# Patient Record
Sex: Female | Born: 1935
Health system: Southern US, Community
[De-identification: ages and names within clinical notes are randomized; demographics above are authoritative.]

## PROBLEM LIST (undated history)

## (undated) DIAGNOSIS — E119 Type 2 diabetes mellitus without complications: Secondary | ICD-10-CM

## (undated) DIAGNOSIS — E559 Vitamin D deficiency, unspecified: Secondary | ICD-10-CM

## (undated) DIAGNOSIS — I639 Cerebral infarction, unspecified: Secondary | ICD-10-CM

## (undated) DIAGNOSIS — M109 Gout, unspecified: Secondary | ICD-10-CM

## (undated) DIAGNOSIS — M1990 Unspecified osteoarthritis, unspecified site: Secondary | ICD-10-CM

## (undated) DIAGNOSIS — M7040 Prepatellar bursitis, unspecified knee: Secondary | ICD-10-CM

## (undated) DIAGNOSIS — R229 Localized swelling, mass and lump, unspecified: Secondary | ICD-10-CM

## (undated) DIAGNOSIS — M6281 Muscle weakness (generalized): Secondary | ICD-10-CM

## (undated) DIAGNOSIS — R059 Cough, unspecified: Secondary | ICD-10-CM

## (undated) DIAGNOSIS — R296 Repeated falls: Secondary | ICD-10-CM

## (undated) DIAGNOSIS — J189 Pneumonia, unspecified organism: Secondary | ICD-10-CM

## (undated) DIAGNOSIS — M62838 Other muscle spasm: Secondary | ICD-10-CM

## (undated) DIAGNOSIS — R5381 Other malaise: Secondary | ICD-10-CM

## (undated) DIAGNOSIS — I509 Heart failure, unspecified: Secondary | ICD-10-CM

## (undated) DIAGNOSIS — L03039 Cellulitis of unspecified toe: Secondary | ICD-10-CM

## (undated) DIAGNOSIS — I2699 Other pulmonary embolism without acute cor pulmonale: Secondary | ICD-10-CM

## (undated) DIAGNOSIS — R569 Unspecified convulsions: Secondary | ICD-10-CM

## (undated) DIAGNOSIS — M545 Low back pain: Secondary | ICD-10-CM

## (undated) DIAGNOSIS — R05 Cough: Secondary | ICD-10-CM

## (undated) DIAGNOSIS — I2510 Atherosclerotic heart disease of native coronary artery without angina pectoris: Secondary | ICD-10-CM

## (undated) DIAGNOSIS — R5383 Other fatigue: Secondary | ICD-10-CM

## (undated) DIAGNOSIS — I10 Essential (primary) hypertension: Secondary | ICD-10-CM

## (undated) DIAGNOSIS — I440 Atrioventricular block, first degree: Secondary | ICD-10-CM

## (undated) DIAGNOSIS — R609 Edema, unspecified: Secondary | ICD-10-CM

## (undated) DIAGNOSIS — E785 Hyperlipidemia, unspecified: Secondary | ICD-10-CM

## (undated) DIAGNOSIS — R0602 Shortness of breath: Secondary | ICD-10-CM

## (undated) DIAGNOSIS — J301 Allergic rhinitis due to pollen: Secondary | ICD-10-CM

## (undated) HISTORY — PX: CATARACT EXTRACTION W/ INTRAOCULAR LENS  IMPLANT, BILATERAL: SHX1307

## (undated) HISTORY — DX: Atrioventricular block, first degree: I44.0

## (undated) HISTORY — DX: Cough, unspecified: R05.9

## (undated) HISTORY — DX: Other muscle spasm: M62.838

## (undated) HISTORY — DX: Cellulitis of unspecified toe: L03.039

## (undated) HISTORY — DX: Other fatigue: R53.83

## (undated) HISTORY — DX: Prepatellar bursitis, unspecified knee: M70.40

## (undated) HISTORY — DX: Vitamin D deficiency, unspecified: E55.9

## (undated) HISTORY — DX: Repeated falls: R29.6

## (undated) HISTORY — DX: Muscle weakness (generalized): M62.81

## (undated) HISTORY — DX: Atherosclerotic heart disease of native coronary artery without angina pectoris: I25.10

## (undated) HISTORY — PX: JOINT REPLACEMENT: SHX530

## (undated) HISTORY — DX: Allergic rhinitis due to pollen: J30.1

## (undated) HISTORY — DX: Unspecified osteoarthritis, unspecified site: M19.90

## (undated) HISTORY — DX: Edema, unspecified: R60.9

## (undated) HISTORY — DX: Other fatigue: R53.81

## (undated) HISTORY — DX: Low back pain: M54.5

## (undated) HISTORY — DX: Cough: R05

## (undated) HISTORY — DX: Localized swelling, mass and lump, unspecified: R22.9

## (undated) HISTORY — DX: Cerebral infarction, unspecified: I63.9

## (undated) HISTORY — DX: Gout, unspecified: M10.9

## (undated) HISTORY — DX: Hyperlipidemia, unspecified: E78.5

## (undated) HISTORY — DX: Other pulmonary embolism without acute cor pulmonale: I26.99

## (undated) HISTORY — DX: Unspecified convulsions: R56.9

---

## 1958-11-09 HISTORY — PX: APPENDECTOMY: SHX54

## 1974-08-09 HISTORY — PX: TONSILLECTOMY: SUR1361

## 1980-04-09 HISTORY — PX: ABDOMINAL HYSTERECTOMY: SHX81

## 1998-09-10 ENCOUNTER — Encounter: Payer: Self-pay | Admitting: Family Medicine

## 1998-09-10 ENCOUNTER — Ambulatory Visit (HOSPITAL_COMMUNITY): Admission: RE | Admit: 1998-09-10 | Discharge: 1998-09-10 | Payer: Self-pay | Admitting: Family Medicine

## 1999-06-11 ENCOUNTER — Ambulatory Visit (HOSPITAL_COMMUNITY): Admission: RE | Admit: 1999-06-11 | Discharge: 1999-06-11 | Payer: Self-pay | Admitting: *Deleted

## 1999-07-04 ENCOUNTER — Encounter: Payer: Self-pay | Admitting: Orthopaedic Surgery

## 1999-07-04 ENCOUNTER — Ambulatory Visit (HOSPITAL_COMMUNITY): Admission: RE | Admit: 1999-07-04 | Discharge: 1999-07-04 | Payer: Self-pay | Admitting: Orthopaedic Surgery

## 1999-07-11 HISTORY — PX: SHOULDER OPEN ROTATOR CUFF REPAIR: SHX2407

## 1999-08-07 ENCOUNTER — Ambulatory Visit (HOSPITAL_BASED_OUTPATIENT_CLINIC_OR_DEPARTMENT_OTHER): Admission: RE | Admit: 1999-08-07 | Discharge: 1999-08-07 | Payer: Self-pay | Admitting: Orthopaedic Surgery

## 1999-08-26 ENCOUNTER — Encounter: Admission: RE | Admit: 1999-08-26 | Discharge: 1999-11-24 | Payer: Self-pay | Admitting: Orthopaedic Surgery

## 2000-04-01 ENCOUNTER — Ambulatory Visit (HOSPITAL_COMMUNITY): Admission: RE | Admit: 2000-04-01 | Discharge: 2000-04-01 | Payer: Self-pay | Admitting: *Deleted

## 2001-03-15 ENCOUNTER — Encounter: Payer: Self-pay | Admitting: Emergency Medicine

## 2001-03-15 ENCOUNTER — Inpatient Hospital Stay (HOSPITAL_COMMUNITY): Admission: EM | Admit: 2001-03-15 | Discharge: 2001-03-18 | Payer: Self-pay | Admitting: Emergency Medicine

## 2001-03-16 ENCOUNTER — Encounter: Payer: Self-pay | Admitting: Internal Medicine

## 2001-10-11 ENCOUNTER — Emergency Department (HOSPITAL_COMMUNITY): Admission: EM | Admit: 2001-10-11 | Discharge: 2001-10-12 | Payer: Self-pay

## 2001-10-12 ENCOUNTER — Encounter: Payer: Self-pay | Admitting: Emergency Medicine

## 2001-11-09 HISTORY — PX: TRANSTHORACIC ECHOCARDIOGRAM: SHX275

## 2001-11-09 HISTORY — PX: CARDIAC CATHETERIZATION: SHX172

## 2001-11-16 ENCOUNTER — Encounter: Payer: Self-pay | Admitting: Internal Medicine

## 2001-11-16 ENCOUNTER — Ambulatory Visit (HOSPITAL_COMMUNITY): Admission: RE | Admit: 2001-11-16 | Discharge: 2001-11-16 | Payer: Self-pay | Admitting: Internal Medicine

## 2001-11-22 ENCOUNTER — Ambulatory Visit (HOSPITAL_COMMUNITY): Admission: RE | Admit: 2001-11-22 | Discharge: 2001-11-22 | Payer: Self-pay | Admitting: Internal Medicine

## 2001-12-30 ENCOUNTER — Inpatient Hospital Stay (HOSPITAL_COMMUNITY): Admission: EM | Admit: 2001-12-30 | Discharge: 2002-01-03 | Payer: Self-pay | Admitting: Emergency Medicine

## 2001-12-30 ENCOUNTER — Encounter: Payer: Self-pay | Admitting: Pediatrics

## 2001-12-31 ENCOUNTER — Encounter: Payer: Self-pay | Admitting: Pediatrics

## 2002-01-05 ENCOUNTER — Encounter: Payer: Self-pay | Admitting: Pediatrics

## 2002-01-05 ENCOUNTER — Encounter (INDEPENDENT_AMBULATORY_CARE_PROVIDER_SITE_OTHER): Payer: Self-pay | Admitting: *Deleted

## 2002-01-05 ENCOUNTER — Inpatient Hospital Stay (HOSPITAL_COMMUNITY): Admission: EM | Admit: 2002-01-05 | Discharge: 2002-01-09 | Payer: Self-pay | Admitting: Emergency Medicine

## 2002-01-06 ENCOUNTER — Encounter: Payer: Self-pay | Admitting: Internal Medicine

## 2003-04-17 ENCOUNTER — Encounter: Payer: Self-pay | Admitting: Internal Medicine

## 2003-04-17 ENCOUNTER — Ambulatory Visit (HOSPITAL_COMMUNITY): Admission: RE | Admit: 2003-04-17 | Discharge: 2003-04-17 | Payer: Self-pay | Admitting: Internal Medicine

## 2003-04-28 ENCOUNTER — Encounter: Payer: Self-pay | Admitting: Internal Medicine

## 2003-04-28 ENCOUNTER — Ambulatory Visit (HOSPITAL_COMMUNITY): Admission: RE | Admit: 2003-04-28 | Discharge: 2003-04-28 | Payer: Self-pay | Admitting: Internal Medicine

## 2003-09-17 ENCOUNTER — Inpatient Hospital Stay (HOSPITAL_COMMUNITY): Admission: RE | Admit: 2003-09-17 | Discharge: 2003-09-20 | Payer: Self-pay | Admitting: Orthopedic Surgery

## 2003-09-20 ENCOUNTER — Inpatient Hospital Stay (HOSPITAL_COMMUNITY): Admission: RE | Admit: 2003-09-20 | Discharge: 2003-09-27 | Payer: Self-pay | Admitting: Orthopedic Surgery

## 2003-10-29 ENCOUNTER — Encounter: Admission: RE | Admit: 2003-10-29 | Discharge: 2003-12-24 | Payer: Self-pay | Admitting: Orthopedic Surgery

## 2004-08-21 ENCOUNTER — Ambulatory Visit: Payer: Self-pay | Admitting: Family Medicine

## 2004-08-23 ENCOUNTER — Emergency Department (HOSPITAL_COMMUNITY): Admission: EM | Admit: 2004-08-23 | Discharge: 2004-08-23 | Payer: Self-pay | Admitting: Emergency Medicine

## 2004-08-27 ENCOUNTER — Encounter: Admission: RE | Admit: 2004-08-27 | Discharge: 2004-08-27 | Payer: Self-pay | Admitting: Orthopaedic Surgery

## 2004-09-09 ENCOUNTER — Ambulatory Visit: Payer: Self-pay | Admitting: Internal Medicine

## 2004-09-16 ENCOUNTER — Encounter: Admission: RE | Admit: 2004-09-16 | Discharge: 2004-10-28 | Payer: Self-pay | Admitting: Orthopaedic Surgery

## 2004-10-14 ENCOUNTER — Ambulatory Visit: Payer: Self-pay | Admitting: Family Medicine

## 2005-01-01 ENCOUNTER — Ambulatory Visit: Payer: Self-pay | Admitting: Family Medicine

## 2005-02-13 ENCOUNTER — Ambulatory Visit: Payer: Self-pay | Admitting: Family Medicine

## 2005-04-10 ENCOUNTER — Emergency Department (HOSPITAL_COMMUNITY): Admission: EM | Admit: 2005-04-10 | Discharge: 2005-04-10 | Payer: Self-pay | Admitting: Emergency Medicine

## 2005-04-13 ENCOUNTER — Ambulatory Visit: Payer: Self-pay | Admitting: Family Medicine

## 2005-04-15 ENCOUNTER — Ambulatory Visit (HOSPITAL_COMMUNITY): Admission: RE | Admit: 2005-04-15 | Discharge: 2005-04-15 | Payer: Self-pay | Admitting: Internal Medicine

## 2005-04-28 ENCOUNTER — Ambulatory Visit: Payer: Self-pay | Admitting: Family Medicine

## 2005-09-09 HISTORY — PX: REPLACEMENT TOTAL KNEE: SUR1224

## 2005-09-17 ENCOUNTER — Ambulatory Visit: Payer: Self-pay | Admitting: Family Medicine

## 2005-10-08 ENCOUNTER — Ambulatory Visit: Payer: Self-pay | Admitting: Family Medicine

## 2005-11-11 ENCOUNTER — Ambulatory Visit: Payer: Self-pay | Admitting: Family Medicine

## 2006-03-02 ENCOUNTER — Ambulatory Visit: Payer: Self-pay | Admitting: Family Medicine

## 2006-04-13 ENCOUNTER — Ambulatory Visit: Payer: Self-pay | Admitting: Family Medicine

## 2006-06-15 ENCOUNTER — Ambulatory Visit: Payer: Self-pay | Admitting: Family Medicine

## 2006-07-20 ENCOUNTER — Ambulatory Visit: Payer: Self-pay | Admitting: Family Medicine

## 2006-08-17 ENCOUNTER — Ambulatory Visit: Payer: Self-pay | Admitting: Family Medicine

## 2006-10-19 ENCOUNTER — Ambulatory Visit: Payer: Self-pay | Admitting: Family Medicine

## 2006-11-14 ENCOUNTER — Emergency Department (HOSPITAL_COMMUNITY): Admission: EM | Admit: 2006-11-14 | Discharge: 2006-11-15 | Payer: Self-pay | Admitting: Emergency Medicine

## 2006-11-15 ENCOUNTER — Ambulatory Visit: Payer: Self-pay | Admitting: Family Medicine

## 2006-11-17 ENCOUNTER — Emergency Department (HOSPITAL_COMMUNITY): Admission: EM | Admit: 2006-11-17 | Discharge: 2006-11-17 | Payer: Self-pay | Admitting: Emergency Medicine

## 2006-11-26 ENCOUNTER — Ambulatory Visit: Payer: Self-pay | Admitting: Family Medicine

## 2007-04-27 DIAGNOSIS — M1990 Unspecified osteoarthritis, unspecified site: Secondary | ICD-10-CM

## 2007-04-27 DIAGNOSIS — E785 Hyperlipidemia, unspecified: Secondary | ICD-10-CM

## 2007-04-27 DIAGNOSIS — J301 Allergic rhinitis due to pollen: Secondary | ICD-10-CM

## 2007-04-27 DIAGNOSIS — E1169 Type 2 diabetes mellitus with other specified complication: Secondary | ICD-10-CM | POA: Insufficient documentation

## 2007-04-27 DIAGNOSIS — K5790 Diverticulosis of intestine, part unspecified, without perforation or abscess without bleeding: Secondary | ICD-10-CM | POA: Insufficient documentation

## 2007-04-27 DIAGNOSIS — E119 Type 2 diabetes mellitus without complications: Secondary | ICD-10-CM | POA: Insufficient documentation

## 2007-04-27 DIAGNOSIS — J309 Allergic rhinitis, unspecified: Secondary | ICD-10-CM | POA: Insufficient documentation

## 2007-04-27 HISTORY — DX: Hyperlipidemia, unspecified: E78.5

## 2007-04-27 HISTORY — DX: Allergic rhinitis due to pollen: J30.1

## 2007-04-27 HISTORY — DX: Unspecified osteoarthritis, unspecified site: M19.90

## 2007-09-14 ENCOUNTER — Encounter: Admission: RE | Admit: 2007-09-14 | Discharge: 2007-10-25 | Payer: Self-pay | Admitting: Orthopaedic Surgery

## 2008-02-14 ENCOUNTER — Encounter: Admission: RE | Admit: 2008-02-14 | Discharge: 2008-02-27 | Payer: Self-pay | Admitting: Orthopaedic Surgery

## 2008-08-24 ENCOUNTER — Encounter: Admission: RE | Admit: 2008-08-24 | Discharge: 2008-08-24 | Payer: Self-pay | Admitting: Internal Medicine

## 2009-08-28 ENCOUNTER — Encounter: Admission: RE | Admit: 2009-08-28 | Discharge: 2009-08-28 | Payer: Self-pay | Admitting: Internal Medicine

## 2010-01-23 ENCOUNTER — Encounter: Admission: RE | Admit: 2010-01-23 | Discharge: 2010-01-23 | Payer: Self-pay | Admitting: Internal Medicine

## 2010-09-01 ENCOUNTER — Encounter: Admission: RE | Admit: 2010-09-01 | Discharge: 2010-09-01 | Payer: Self-pay | Admitting: Internal Medicine

## 2011-03-27 NOTE — Op Note (Signed)
NAME:  [name] [name], [name] [name]                        ACCOUNT NO.:  [id number]   MEDICAL RECORD NO.:  [id number]                   PATIENT TYPE:  INP   LOCATION:  2550                                 FACILITY:  [facility]   PHYSICIAN:  Mila Homer. Sherlean Foot, [name]              DATE OF BIRTH:  [birth year]   DATE OF PROCEDURE:  09/17/2003  DATE OF DISCHARGE:                                 OPERATIVE REPORT   PREOPERATIVE DIAGNOSIS:  Right knee osteoarthritis.   POSTOPERATIVE DIAGNOSIS:  Right knee osteoarthritis.   PROCEDURE:  Right total knee arthroplasty.   SURGEON:  Mila Homer. Sherlean Foot, [name]   ASSISTANT:  Jamelle Rushing, P.A.   ANESTHESIA:  General, plus preoperative femoral nerve block.   INDICATIONS FOR PROCEDURE:  The patient is a 77 year old status post failure  of conservative measures for osteoarthritis of the knee.  Informed consent  was obtained.   DESCRIPTION OF PROCEDURE:  The patient was laid supine and administered  general anesthesia.  A Foley catheter was placed.  The right lower extremity  was prepped and draped in the usual sterile fashion.  A #10 blade was used  to make a 10 cm anterior midline incision.  A new #10 blade was used to make  a straight median parapatellar arthrotomy, and perform synovectomy.  I then  inverted the patella, measured it 23 mm and used a 32 mm reamer to ream down  to 15 mm.  I then used the 32 template to drill three lug holes, and with  the prosthetic trial in place it also measured 23 mm.  I left the kneecap  inverted and removed the prosthetic trial, and used the #10 blade to develop  a periosteal sleeve of the deep MCL off the medial crest of the tibia, and  hold it in place with the retractor.  I then cut the ACL and PCL and  delivered the tibia anteriorly.  I used the intermedullary alignment system  to make a cut perpendicular to the anatomic axis, removing 2 mm of bone off  the lateral tibial plateau.  I removed the excess bone.  I removed  the  intermedullary alignment guide.  Then turned attention to the femur.   I made an intermedullary drill hole in the femur, and then used the  intermedullary guide set on 4-degree valgus cut.  I tamped it down to the  distal aspect of the femur and pinned our distal femoral cutting into place.  A made a distal femoral cut with the sagittal saw, and then I removed the  cutting guides.  I drew the epicondylar axis, posterior condylar angle  measured 3 degrees, and so I sized to a  size D, and pinned through the 3-degree external rotation holes.  I then  placed our femoral cutter in place and made our anterior, posterior and  chamfer cuts.  I removed the skeletonized excess bone, and finished with  a  size D finishing block of the femur.   I then turned attention to the tibia, where I used a size 4 tibial tray.  I  used the drill keel and then put the trial for tibia in place.  I then  placed a trial D femur in place and a 10 mm poly trial.  I then snapped on  the 32 mm patella as well, dropping the angles to 130 degrees.  Patellar  tracking was excellent.  Stability was excellent.   I then irrigated copiously and removed all trial components.  I then  cemented the size 4 tibia first, sized the femur second, snapped in the real  polyethylene, located the knee, put in the extension and cemented in the 32  mm patella.  I then irrigated once again, after the cement was hardened.  Let the tourniquet down (this was at 43 min).  I then closed the arthrotomy  with #1 Vicryls, with a Hemovac coming out superolaterally deep to the  arthrotomy.  I then placed a pain catheter superficial to the arthrotomy  closure, coming out superolaterally as well.  I then closed the deep soft  tissues with interrupted 0 Vicryl with running subcuticular Vicryl, and skin  staples.  I dressed with Xeroform dressing sponges, sterile Webril and TEDS  stocking.   COMPLICATIONS:  None.   DRAINS:  One Hemovac.    TOURNIQUET TIME:  43 min.                                               Mila Homer. Sherlean Foot, [name]    SDL/MEDQ  D:  09/17/2003  T:  09/17/2003  Job:  [id number]

## 2011-03-27 NOTE — H&P (Signed)
[place]  Patient:    [name] [name], HATLESTAD Visit Number: [id number] MRN: [id number]          Service Type: MED Location: 1800 [id number] [id number] Attending Physician:  Devoria Albe Dictated by:   [name], [name] Admit Date:  01/05/2002   CC:         [facility]   History and Physical  DATE OF BIRTH:  [birth year]  HISTORY OF THE PRESENT CONDITION:  [name] is a 77 year old right-handed African-American woman with a history of uncontrolled hypertension who was admitted to [facility] February 21 through February 25 for episodes of dizziness.  Patient was placed on our stroke service.  Her workup including MRI scan of the brain, MRA intracranial, MRA of the neck failed to show significant evidence of acute stroke or of occlusion of vessels.  Patient had mild to moderate small-vessel changes affecting the pons of the deep white matter. She did not have significant atrophy.  The patient had widely patent carotid and vertebral vessels with the left vertebral greater than the right.  There was no flow-limiting stenosis.  Internal carotid artery showed a signal loss on the 2-D imaging but was widely patent on the 3-D imaging.  Right common carotid was widely patent without significant stenosis.  There was some signal loss again on the 2-D study but widely patent on the 3-D study.  Intracranial angiography showed no evidence of proximal stenosis.  There was some irregularity consistent with diffuse intracranial atherosclerotic disease in the anterior circulation and also in the posterior circulation.  A 2-D echocardiogram failed to show any abnormalities of the heart.  Chest x-ray showed mild cardiomegaly without acute changes.  EKG showed sinus tachycardia with possible inferior infarct pattern of undetermined age but no evidence of pathologic arrhythmias.  EEG showed intermittent right frontal slowing with occasional FIRDA.   I have reviewed this latter study and believed that the patient basically had a normal record with the patient awake and drowsy.  When she was alerted, the record became a 10 hertz rhythm; when she was drowsy, frontal rhythmic slowing was evident.  The patient did not drift into natural sleep.  The patient also had significant evaluation including lipid profile that was normal with the exception of elevated cholesterol at 203, HDL 79, LDL 98, triglycerides 129.  Serum homocysteine normal at 9.11.  Hemoglobin A1c normal at 6.3.  Urinalysis showed 3-6 white blood cells, many bacteria, but negative nitrite and negative leukocyte esterase suggesting that this was a contaminated specimen and not a urinary tract infection.  The patient did not show left shift on CBC, did not show significant anemia or hypochromia, or microcytosis.  The patient seemed to do well in the hospital.  During daily evaluations by the stroke physician, Dr. [name], the patient was pleasant, awake, and alert. At other times, however, according to family members, the patient seemed to be having hallucinations - both visual and auditory.  The patient was discharged home and we were unaware of her behavior.  I was called yesterday with concerns of the patient had been up all night, crying out, seeing people in her home, people dive under her bed, visualizing smoke and other humors coming from her ceiling.  These people did not speak to her.  At other times, however, she thought that she heard voices as if they were coming out of a crowd.  There were no command voices.  The patient has  not had pressured speech, has not had paranoid ideation, but she has seemed quite frightened and has frightened her family - who have stayed with her continuously since her discharge.  In addition, the patient has had some periods of incoherent speech, saying words backwards and speaking in language that was not understood by her family.  At  other times, her speech seemed very slurred.  PAST MEDICAL HISTORY:  1. Periods of dizziness which may involve vertigo disequilibrium but this is     of unknown cause, given the previous workup.  2. Hypertension.  3. Osteoarthritis.  4. Moderate obesity.  5. Gout.  6. Post menopausal changes.  7. Prior angina with negative workup.  PAST SURGICAL HISTORY:  Hysterectomy, appendectomy, tonsillectomy, rotator cuff repair.  CURRENT MEDICATIONS:  1. Lopressor 100 mg b.i.d.  2. Colchicine 0.6 mg b.i.d.  3. Imdur 30 mg q.d.  4. Premarin 0.625 q.d.  5. Enteric-coated aspirin 325 mg q.d.  6. Lasix 20 mg q.d.  7. Lotensin 20 mg b.i.d.  8. Clonidine 0.3 mg b.i.d.  9. Plavix 75 mg q.d. 10. Celebrex 100 mg b.i.d.  Her p.r.n. medications include Allegra and Tessalon Pearls.  ALLERGIES:  None known.  FAMILY HISTORY:  Father died age 61 - myocardial infarction.  Mother died - unknown causes.  There is no history of strokes, atherosclerotic disease, hypertension, or diabetes, and no history of psychiatric disease.  SOCIAL HISTORY:  The patient lives alone in [place], she is a widow, and is retired.  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  She does not use tobacco, alcohol, or drugs. She has two daughters who have been very attentive to her needs.  PHYSICAL EXAMINATION TODAY:  GENERAL:  Well-developed pleasant woman, lying calmly in bed, in no distress. When I am with the patient she has not had any behaviors that would suggest psychosis.  At other times, after we have stepped out of the room, the patient will feel as if she is speeding around on the gurney, going through bushes and down the street.  She is seeing objects and also smoke coming from the ceiling.  VITAL SIGNS:  Temperature 98, pulse 86, blood pressure 179/86 (was 234/106), pulse oximetry 100%.  HEENT:  No signs of infection.  NECK:  Supple.  LUNGS:  Clear.  HEART:  No murmurs.  Pulses normal.   ABDOMEN:  Soft.  Bowel sounds  normal.  No hepatosplenomegaly or tenderness.  EXTREMITIES:  Normal without edema, cyanosis, alterations in tone, or tight heel cords.  NEUROLOGIC:  Mini mental status examination was 23.  She was able to name objects and follow commands but she had deficits in reversals of spelling the word "world," dyscalculia, and problems with immediate memory and constructions.  There was no dysphasia or dyspraxia.  The patient did not show any signs of altered mental status while I examined her.  Cranial nerves:  Round reactive pupils.  Mild cataracts through which we can see the fundi very easily.  Visual fields full to double simultaneous stimuli. Symmetric facial strength, midline tongue.  Air conduction greater than bone conduction.  Motor examination:  Normal strength, tone, and mass.  Good fine motor movements.  No pronator drift.  Sensation intact to cold, vibration, and stereoagnosis.  Cerebellar examination:  Good finger-to-nose, rapid repetitive movements.  Gait:  Slightly broad based.  She can get up on her heels and toes.  Deep tendon reflexes are diminished.  Patient had bilateral flexor plantar responses.  IMPRESSION: 1. Visual and auditory  hallucinations of unknown etiology.  My suspicion is    that either we are dealing with an organic psychosis, a true psychosis -    which would be unusual for her age, or malingering behavior. 2. History of dizziness with symptoms suggestive of vertebrobasilar    insufficiency with a negative central nervous system workup. 3. Mild organic brain syndrome, unknown etiology. 4. Hypertension, at times malignant.  PLAN: 1. Lumbar puncture - which is done. 2. We will repeat her EEG. 3. Sedimentation rate, VDRL, vitamin B12, red blood cell, folic acid, and TSH. 4. ACT team consult. 5. I already asked primary care (internal medicine) to admit this patient    because she is followed by [facility] with [place] serving as consultants.    If that is  unacceptable, we will admit and ask them to consult.  Informed consent was obtained from the patient and her daughter for the lumbar puncture.  My impressions were discussed in detail with the daughter only and in vague generalities with the patient. Dictated by:   [name], [name] Attending Physician:  Devoria Albe DD:  01/05/02 TD:  01/05/02 Job: [id number]

## 2011-03-27 NOTE — H&P (Signed)
[place]  Patient:    SARH, KIRSCHENBAUM Visit Number: [id number] MRN: [id number]          Service Type: MED Location: 3000 3031 [id number] Attending Physician:  Mick Sell Dictated by:   [name], [name] Admit Date:  12/30/2001   CC:         [facility]   History and Physical  DATE OF BIRTH:  November 08, 1930  CHIEF COMPLAINT:  TIAs x5.  HISTORY OF THE PRESENT ILLNESS:  [age]-year-old African American widowed woman with 30-minute episodes of light-headedness, blurred vision to the point of being unable to read even large print, and bilateral tingling of her hands. The patient was brought to the emergency room for evaluation.  She has had 4 other episodes beginning in early December 2002, the last about 12 days ago when she was in church.  Patient has had workup with CT scan of the brain, which is reportedly negative, and she supposedly had normal carotid Dopplers.  She had a 2-D echocardiogram back last summer when she came in the hospital complaining of chest pain; the workup was negative.  PAST MEDICAL HISTORY:  Remarkable for morbid obesity, hypertension, osteoarthritis, gout, postmenopausal changes, prior angina with a negative workup.  It is negative for diabetes mellitus, dyslipidemia and smoking.  REVIEW OF SYSTEMS:  Negative for fever, infections in the head and neck, lungs, GI, GU, nausea, vomiting, headache, seizure, syncope, bleeding dyscrasias, shortness of breath, dyspnea on exertion, heart disease, depression, anxiety, diplopia, dysarthria, dysphagia, tinnitus, vertigo, weakness, or loss of bowel and bladder control.  PAST SURGICAL HISTORY:  Positive for hysterectomy, appendectomy and tonsillectomy.  FAMILY HISTORY:  Father died in his 3s of heart disease.  Mother died when the patient was a year old, no one knows the cause.  Patient is an only child. She has 4 daughters and 11 grandchildren.   There is a history of strokes and atherosclerotic disease, hypertension and diabetes in somewhat more remote relatives.  SOCIAL HISTORY:  Patient lives alone.  Two of her daughters are in town.  She does not use tobacco or alcohol.  MEDICATIONS: 1. Lopressor 100 mg twice a day. 2. Celebrex 100 mg twice a day. 3. Colchicine 0.6 mg twice a day. 4. Lotensin 20 mg twice a day. 5. Clonidine 0.2 mg twice a day. 6. Imdur 30 mg per day. 7. Premarin 0.625 mg per day. 8. Lasix 20 mg per day. 9. Benzonatate 100 mg two p.o. t.i.d.  It is my impression that the patient has been on aspirin, but that was not mentioned and was not in her medications.  ALLERGIES TO MEDICINES:  None known.  PHYSICAL EXAMINATION:  GENERAL:  On examination today, well-developed, well-nourished, obese, right-handed woman in no distress.  VITAL SIGNS:  Temperature 97.6, blood pressure 138/96, resting pulse 109, respirations 20, pulse oximetry 97%.  ENT/NECK:  No bruits.  No signs of infection.  Supple neck.  Full range of motion.  LUNGS:  Clear.  HEART:  No murmurs.  Pulses normal.  ABDOMEN:  Soft.  Bowel sounds normal.  No hepatosplenomegaly.  Abdomen is protuberant.  EXTREMITIES:  No edema or cyanosis.  NEUROLOGIC:  Mental status:  Patient was awake, alert, without dysphasia or dyspraxia.  Cranial nerve examination:  Round reactive pupils.  Normal fundi.  Full visual fields to double simultaneous stimuli, okay and responses equal bilaterally. Visual acuity 20/30 OD, 20/30 -1 OS with glasses.  Symmetric facial strength. Midline tongue and  uvula.  Air conduction greater than bone conduction bilaterally.  Motor examination:  Normal strength, tone and mass.  Good fine motor movements.  No pronator drift.  Sensation intact other than mild peripheral neuropathy.  Patient had normal stereoagnosis.  Gait was broad-based, waddling.  She can get up on her toes better than her heels.  She cannot tandem.   Deep tendon reflexes are absent.  She had bilateral flexor plantar responses.  IMPRESSION:  Vertebrobasilar insufficiency, 435.1.  I cannot rule out transient hypotension causing this sequence of events.  PLAN:  Admit, work up and will treat her if possible.  We will obtain orthostatic blood pressures.  Patient will be started on aspirin.  Workup will include MRI of the brain, MRA of the brain and neck vessels, transthoracic echocardiogram, serum homocysteine, fasting lipid profile and hemoglobin A1c as well as routine labs. Dictated by:   [name], [name] Attending Physician:  Mick Sell DD:  12/30/01 TD:  12/31/01 Job: [id number]

## 2011-03-27 NOTE — Discharge Summary (Signed)
[place]  Patient:    [name] [name], [name] [name] Visit Number: [id number] MRN: [id number]          Service Type: EMS Location: [place] Attending Physician:  [name] [name] Dictated by:   [name] [name], [name] Admit Date:  01/05/2002 Disc. Date: 01/03/02   CC:         [name] [name]. [name] [name], [name]  [name] [name], [name] at [facility]   Discharge Summary  DATE OF BIRTH:  Apr 03, 1931.  ADMITTING DIAGNOSIS:  Transient ischemic attack versus vertebrobasilar insufficiency.  DISCHARGE DIAGNOSES:  1. Vertigo, disequilibrium with unknown etiology.  2. Hypertension.  3. Morbid obesity.  4. Osteoarthritis.  5. Gout.  6. Postmenopausal.  7. Prior angina with negative workup.  8. Hysterectomy.  9. Appendectomy. 10. Tonsillectomy.  DISCHARGE MEDICATIONS:  1. Lopressor 100 mg b.i.d.  2. Colchicine 0.6 mg b.i.d.  3. Imdur 30 mg q.d.  4. Premarin 0.625 mg q.d.  5. Lasix 20 mg q.d.  6. Enteric-coated aspirin 325 mg q.d.  7. Lotensin 20 mg b.i.d.  8. Klonodine 0.3 mg b.i.d.  9. Plavix 75 mg q.d. 10. Celebrex 100 mg b.i.d. 11. Allegra p.r.n. 12. Teflon Perles 100 mg 2 t.i.d. p.r.n.  ACTIVITY:  No restrictions.  Use cane as needed at home.  DIET:  Low salt with regular consistency, thin liquids.  WOUND CARE:  Not applicable.  PROCEDURES: 1. MRI of the brain performed December 31, 2001 showing no acute stroke,    mild to moderate small vessel changes affecting the pons and deep white    mater are present. 2. MRA of the neck performed December 31, 2001 which is negative.  No    stenotic disease. 3. MRA of the brain performed December 31, 2001 showing no large vessel    occlusion or proximal stenosis.  There is some irregularity of the medium    sized vessel consistent with diffuse intracranial atherosclerotic disease. 4. A 2-D echocardiogram performed January 02, 2002 demonstrating an ejection    fraction of 55-65%. There was trivial aortic valve  regurgitation.  No    left ventricular wall abnormalities.  No cardiac source of embolus. 5. Chest x-ray performed December 30, 2001 showing mild cardiomegaly.  Aortic    elongation and calcification.  No definite infiltrate or CHF. 6. EKG performed December 30, 2001 showing sinus tachycardia.  Additional    cardiologist comment include possible inferior infarct pattern, age    undetermined. 7. EEG showing intermittent right frontal slowing with phasic occasional    bilateral frontal slowing, FRIDA.  No epileptiform.  LABORATORY DATA:  A lipid profile showing a slight elevated cholesterol at 203.  Triglycerides 129, HDL 79, LDL 98.  CBC with white blood cells 8.7, red blood cells 4.5, hemoglobin 13.8, hematocrit 40.7, MCV 90.5, MCHC 34.0, RDW 14.4, and platelets 318,000.  Differential was normal except for lymphocytes slightly elevated at 54 with absolute lymphocytes 4.7.  PT was 13.3, INR 1.0, PTT 27.  Sodium 138, potassium 3.6, chloride 101, CO2 27, glucose 97, BUN 14, creatinine 0.9, calcium 9.7, total protein 7.1, albumin 3.9.  AST 26, ALT 19, ALP 57, total bilirubin 0.6.  Homocystine was normal at 9.11.  UA on December 30, 2001 showed many epithelial cells with 3 to 6 white blood cells, many bacteria, and highland cast.  Negative for nitrites and leukocyte esterase.  HISTORY OF PRESENT ILLNESS:  [name] [name] is a 77 year old African-American female with an episode of lightheadedness and blurred vision to the  point of being unable to read and bilateral tingling in her hands.  She was brought to the emergency room for evaluation.  The patient has had similar episodes beginning December 2002, with last episode being in church 12 days ago.  HOSPITAL COURSE:  The patient was admitted to evaluate TIA with vertebral basilar insufficiency.  Also cannot rule out transient hypertension.  Blood pressure was elevated during hospitalization increasing to 195/110 with medications adjusted for  control.  The patient did not demonstrate orthostatic hypotension.  There was no acute stroke.  Other test results were normal.  Neurological deficits cleared by discharge with no other "spells."  Plavix was added to medication regimen and the patient was discharged home.  CONDITION ON DISCHARGE:  The patient is alert and oriented x 3.  No aphasia. No facial weakness.  Speech clear.  Extraocular movements intact.  Strength normal.  Chest clear to auscultation.  Heart rate and rhythm regular.  No ataxia.  Gait is slow and steady.  PLAN: 1. Discharge home. 2. Aspirin and Plavix for stroke prevention. 3. Follow up with [name] [name] on a day that Dr. Gustavus [name]. [name] [name]    is there in three to four weeks. 4. Follow up with [name] [name], [name], for blood pressure control at    [facility]. Dictated by:   [name] [name], [name] Attending Physician:  [name] [name] DD:  01/03/02 TD:  01/03/02 Job: [id number]

## 2011-03-27 NOTE — Discharge Summary (Signed)
NAME:  [name] [name], [name] [name]                        ACCOUNT NO.:  [id number]   MEDICAL RECORD NO.:  [id number]                   PATIENT TYPE:  IPS   LOCATION:  4004                                 FACILITY:  [facility]   PHYSICIAN:  Ranelle Oyster, [name]             DATE OF BIRTH:  1930-10-15   DATE OF ADMISSION:  09/20/2003  DATE OF DISCHARGE:  09/27/2003                                 DISCHARGE SUMMARY   DISCHARGE DIAGNOSES:  1. Right total knee arthroplasty September 17, 2003, secondary to degenerative     joint disease.  2. Pain management.  3. Anemia.  4. Subcutaneous Lovenox for deep vein thrombosis prophylaxis.  5. Hypertension.  6. Hyperlipidemia.  7. History of right shoulder rotator cuff repair.   HISTORY OF PRESENT ILLNESS:  This is a 77 year old black female admitted  September 17, 2003, with end-stage degenerative joint disease of the right  knee, no relief with conservative care.  Underwent a right total knee  arthroplasty September 17, 2003, per Dr. Sherlean Foot.  Placed on subcutaneous  Lovenox for deep vein thrombosis prophylaxis and weightbearing as tolerated.  Postoperative anemia, was transfused two units of packed red blood cells.  Pain was controlled with the use of OxyContin.  Moderate assist for bed  mobility, total assist transfers.  Latest chemistries unremarkable.  Admitted for a comprehensive rehab program.   PAST MEDICAL HISTORY:  See discharge diagnoses.   PAST SURGICAL HISTORY:  1. Tonsillectomy.  2. Appendectomy.  3. Hysterectomy.  4. Right shoulder surgery.   No alcohol, remote smoker.   ALLERGIES:  CODEINE.   MEDICATIONS PRIOR TO ADMISSION:  Toprol, Arthrotec, Lasix, Imdur, Clonidine,  Norvasc, aspirin, Os-Cal, Vicodin, and Lipitor.   SOCIAL HISTORY:  Lives alone in [place], independent prior to admission  with a cane.  Retired.  One-level home, no steps to entry.  Local family  works, family can check on patient during the day.   HOSPITAL COURSE:   The patient did well on Rehabilitation Services with  therapies initiated on a b.i.d. basis.  The following issues were followed  during the patient's rehab course.   Pertaining to Ms. Gundrum' right total knee arthroplasty, surgical site  healing nicely, no signs of infection, and neurovascular sensation remained  intact.  She was weightbearing as tolerated.  Range of motion at 80 degrees.  She continued on subcutaneous Lovenox throughout her rehab stay for deep  vein thrombosis prophylaxis.  Pain management ongoing with the use of  oxycodone.  Postoperative anemia with latest hemoglobin of 10.3, hematocrit  29.8.  There were no bleeding episodes.  Her blood pressures remained  controlled on Lopressor, Lasix, Clonidine and Norvasc.  She would continue  her Zocor for hyperlipidemia.  She denies any bowel or bladder disturbances.  Overall, for her functional ability she was ambulating extended household  distances with walker, essentially independent to standby assist in all the  areas of activities of daily  living in dressing, grooming and homemaking.  Strength and endurance greatly improved.  She would continue with Home  Health physical and occupational therapy per [place].   Latest labs showed a hemoglobin 10.3, hematocrit 29.8.  Sodium 139,  potassium 3.6, BUN 10, creatinine 0.8.  Urinalysis study with white blood  cells too numerous to count and bacteria too numerous to count.  Placed on  empiric amoxicillin for urinary tract infection with cultures and  sensitivities pending.   DISCHARGE MEDICATIONS:  1. Zocor 20 mg daily.  2. Trinsicon twice daily.  3. OxyContin 10 mg every 12 hours x1 week and discontinue.  4. Norvasc 10 mg daily.  5. Aspirin 81 mg daily.  6. Clonidine 0.2 mg twice daily.  7. Lasix 20 mg daily.  8. Imdur 30 mg daily.  9. Toprol XL twice daily.  10.      Multivitamin daily.  11.      Arthrotec daily.  12.      Oxycodone as needed for pain.  13.       Amoxicillin 250 mg three times daily x6 days.   ACTIVITY:  As tolerated.   DIET:  Regular.   WOUND CARE:  Cleanse incision daily with warm soap and water.   SPECIAL INSTRUCTIONS:  Home Health physical and occupational therapy.  Follow up with Dr. Sherlean Foot of Orthopedic Services, call for appointment.      Mariam Dollar, P.A.                     Ranelle Oyster, [name]    DA/MEDQ  D:  09/26/2003  T:  09/27/2003  Job:  [id number]   cc:   Mila Homer. Sherlean Foot, [name]  [address] [place]  [place] [address]  Fax: [phone]   Dr. Margarette Canada  [phone]

## 2011-03-27 NOTE — Consult Note (Signed)
[facility]  Patient:    [name] [name], [name] [name]                     MRN: [id number] Adm. Date:  [id number] Attending:  Enrique Sack CC:         Erskine Speed, [name]  [name], [name]   Consultation Report  REFERRING PHYSICIAN:  Erskine Speed, [name]  REASON FOR REFERRAL:  Chest pain.  HISTORY:  [name] [name] is a very pleasant 77 year old female who first noted an onset of chest pain on Sunday evening approximately 6 p.m.  The pain was described as moderate from 6 p.m. to 10 p.m. and was associated with shortness of breath.  There was no nausea, vomiting, or diaphoresis.  The pain waxed and waned for approximately 48 hours.  Her daughter arrived to transport her to [facility] for some shopping and her daughter became quite concerned regarding the history of chest pain.  They then called Dr. Darrol Angel office who recommended the patient be transferred for further evaluation.  [name] notes that she has been active.  She did her house work on Monday, drove to [place], went shopping, did laundry, etcetera without exacerbation of her chest pain.  Upon arrival to the emergency room she was made pain free by IV nitroglycerin and aspirin.  Her coronary risk factors are significant for age, postmenopausal status, hypertension.  Her cholesterol status is undefined.  No history of diabetes.  PAST MEDICAL HISTORY:  Significant for arthritis and hypertension.  PAST SURGICAL HISTORY:  Significant for hysterectomy with BSO in 1980, tonsillectomy and adenoidectomy in 1970, appendectomy 1949, rotator cuff repair in 2000.  MEDICATIONS: 1. Atenolol 100 mg p.o. q.d. 2. Verapamil SR 240 q.d. 3. Triamterene/hydrochlorothiazide 75/50. 4. Celebrex p.r.n. 5. Premarin.  Upon admission to the hospital nitroglycerin drip as well as heparin drip was added.  The patient was changed from atenolol to Lopressor 50 mg p.o. b.i.d. and started on aspirin.  ALLERGIES:   CODEINE which results in rash and hives.  SOCIAL HISTORY:  She lives alone.  She was divorced prior to her husband passing.  She has lived alone for approximately 20 years.  She is retired from [employer].  No history of alcohol, tobacco, or drugs of abuse.  She has a very supportive family.  FAMILY HISTORY:  Father passed at age 95 from myocardial infarction.  Paternal grandmother passed at age 66 from heart problems.  There is a history of diabetes as well.  REVIEW OF SYSTEMS:  There has been no change in bowel or bladder habits.  No orthopnea, PND, tachy arrhythmias, pedal edema, headaches, change in vision. Review of systems is otherwise negative.  PHYSICAL EXAMINATION  GENERAL:  Elderly African-American female in no acute distress.  Currently she is pain free.  VITAL SIGNS:  Systolic pressure ranges from 147-829/56.  Heart rate 74.  She is afebrile.  O2 saturation is 100% on 2 L O2.  HEENT:  Unremarkable.  NECK:  No evidence of neck vein distention.  No carotid bruits.  Good carotid upstrokes.  Thyroid is nonpalpable.  PULMONARY:  No use of accessory muscles.  Breath sounds are equal and clear to auscultation.  CARDIOVASCULAR:  PMI is not palpated secondary to pendulous breasts.  Regular rate and rhythm.  Normal S1.  Normal S2.  No rubs, murmurs, or gallops are noted.  ABDOMEN:  Obese, nontender.  No bruits or unusual pulsations are noted.  EXTREMITIES:  No cyanosis, clubbing, or  edema.  Distal pulses are 2+ and palpable.  NEUROLOGIC:  Nonfocal.  Motor 5/5 throughout.  SKIN:  Warm and dry.  LABORATORIES:  White count 7.4, hematocrit 35, platelet count 334, 000.  INR 1.0.  Normal electrolytes.  Creatinine 1.0.  Normal liver enzymes.  Troponin I 1.01.  Normal CK.  Chest x-ray reveals low volume with cardiomegaly.  ECG cannot rule out old inferior MI.  There is no previous ECG to compare.  There is no acute ischemic changes noted.  IMPRESSION: 1. Prolonged  episode of chest pain more than 48 hours in duration.  She has    normal cardiac enzymes and no evidence of ischemia on her ECG.  Her chest    pain has atypical and typical features.  Agree with heparin and intravenous    nitroglycerin.  Would increase her Lopressor to 50 mg p.o. b.i.d. Risks,    benefits, and options were discussed with the patient.  She wishes to    proceed with an Adenosine Cardiolite.  I feel that this is a valid option    in that the patient has normal cardiac enzymes and normal ECG following    more than 48 hours of chest pain.  An Adenosine Cardiolite will be    scheduled for further evaluation. 2. Hypertension.  Her blood pressure is well controlled on her current    regimen.  Will continue with her verapamil and increase the Lopressor to 50    b.i.d.  Agree with initiation of aspirin in that the patient has no    contraindication.  Further recommendations will be pending the outcome of    the Adenosine Cardiolite. DD:  03/15/01 TD:  03/16/01 Job: [id number]

## 2011-03-27 NOTE — Discharge Summary (Signed)
NAME:  [name] [name], [name] [name]                        ACCOUNT NO.:  [id number]   MEDICAL RECORD NO.:  [id number]                   PATIENT TYPE:  INP   LOCATION:  5035                                 FACILITY:  [facility]   PHYSICIAN:  Mila Homer. Sherlean Foot, [name]              DATE OF BIRTH:  09/26/1930   DATE OF ADMISSION:  09/17/2003  DATE OF DISCHARGE:  09/20/2003                                 DISCHARGE SUMMARY   ADMISSION DIAGNOSES:  1. Osteoarthritis right knee.  2. Hypertension.  3. Hypercholesterolemia.  4. Type 2 diabetes mellitus.  5. Allergies and sinus congestion.  6. History of myocardial infarction  7. History of transient ischemic attacks.   DISCHARGE DIAGNOSES:  1. Osteoarthritis right knee status post right total knee arthroplasty.  2. Acute blood loss anemia secondary to surgery.  3. Hypertension.  4. Hypercholesterolemia.  5. Type 2 diabetes mellitus.  6. Allergies and sinus congestion.  7. History of myocardial infarction.  8. History of transient ischemic attacks.   SURGICAL PROCEDURE:  On September 17, 2003 Ms. [name] [name] underwent a right  total knee arthroplasty by Dr. Mila Homer. Lucey assisted by Arlyn Leak,  P.A.C.  She had a NexGen complete leg complete Legacy knee LPS femoral  component size B right placed with a Legacy knee LPS articular surface size  yellow CD 10 mm height.  An all poly patella, standard size 32 mm diameter,  8.5 mm thickness with a NexGen fluted stemmed tibial component size 4.   COMPLICATIONS:  None.   CONSULTATIONS:  1. Rehab medicine consult September 18, 2003.  2. Physical therapy consult September 18, 2003.  3. Occupational therapy consult and case management consult September 19, 2003.   HISTORY OF PRESENT ILLNESS:  This 77 year old black female patient presented  to Dr. Sherlean Foot with an 18-year history of right knee pain.  The pain is a  sharp, aching sensation that is diffuse about the knee without radiation.  The knee gives away,  catches and locks at times and she has difficulty going  up and down stairs and walking long distances.  X-rays have shown arthritis  of the knee and she has failed conservative treatment and because of that  she is presenting for a right knee replacement.   HOSPITAL COURSE:  Ms .[name] [name] tolerated her surgical procedure well without  immediate postoperative complications.  She was transferred to 5000.  On  postoperative day one T-max was 100.1, vitals were stable.  The leg was  neurovascularly intact and dressing was in placed without drainage.  Marcaine pump was in place.  Hemoglobin was 10.5, hematocrit 30.5 and white  count 11,400.  Potassium was a little low at 3.3 and that was monitored.  She was started on therapy for protocol.  On postoperative day two she was  doing well.  Pain controlled with medication and she was switched to p.o.  medication.  T-max was  100.1, vitals were stable.  Hemoglobin was 9,  hematocrit 26.7.  She was continued on therapy and was waiting for a rehab  bed.   On postoperative day three she complained of some fatigue.  T-max was 100.7,  vitals were stable but hemoglobin had dropped to 8.6 with hematocrit of  25.5.  Leg incision was otherwise benign.  Because of her significant  cardiac history she was subsequently transfused with two units of packed red  blood cells.  A rehab bed became available for her later on that day and she  was subsequently transferred to rehab.   DISCHARGE INSTRUCTIONS:  Diet.  She should continue her hospitalization diet  with adjustments to be made per the rehab physicians.   DISCHARGE MEDICATIONS:  Continue her current hospitalization medications  with adjustments to be made per the rehab physicians.   ACTIVITY:  She can be out of bed weight bearing as tolerated on the right  leg with the use of a walker.  She is to continue physical therapy and  occupational therapy per rehab protocol and continue CPM 0 to 90 degrees six   to eight hours a day.   WOUND CARE:  Please clean the right knee incision with Betadine daily and  apply a dry dressing.  Notify Dr. Sherlean Foot if temperature greater than 101.5,  chills, pain unrelieved by pain medications or drainage from the wound.  Staples can be removed and Steri-strips with Benzoin applied on about  postoperative day 14 if she is still in rehab, otherwise she needs to follow  up with Dr. Sherlean Foot at that time.   FOLLOW UP:  If staples are still in place after discharge from rehab she  needs to follow up with Dr. Sherlean Foot on about postoperative day 14.  Otherwise  she can follow up with Dr. Sherlean Foot in a week or two after discharge from  rehab.  Please call [phone] for that appointment.   LABORATORY DATA:  On September 18, 2003 white count 11,400, hemoglobin 10.5,  hematocrit 30.5.  On September 19, 2003 white count 13,300, hemoglobin 9,  hematocrit 26.7 and platelets 237,000.  On September 11, 2003 glucose 115.  On  September 18, 2003 potassium was 3.3, glucose 123, calcium 8.2.  On September 19, 2003 sodium was 134, potassium 3.4, glucose 130 and calcium 8.1.  All  other laboratory studies were within normal limits.      Legrand Pitts Duffy, P.A.                      Mila Homer. Sherlean Foot, [name]    KED/MEDQ  D:  10/08/2003  T:  10/08/2003  Job:  [id number]   cc:   [name], [name]

## 2011-03-27 NOTE — H&P (Signed)
[facility]  Patient:    [name] [name], [name] [name]                     MRN: [id number] Adm. Date:  [id number] Attending:  Enrique Sack CC:         Meade Maw, [name]   History and Physical  CHIEF COMPLAINT:  Chest pain.  HISTORY OF PRESENT ILLNESS:  This 77 year old patient was seen here at [facility] with chest pain.  On Mar 13, 2001, she had chest pain while at rest with some tightness and shortness of breath off and on all day.  Her chest pain was somewhat less on Mar 14, 2001 but this morning, the day of admission, Mar 15, 2001, chest pain was worse again.  There was no radiating pain but she felt a sternal heaviness with squeezing tightness with shortness of breath.  She called her doctor at [facility], Dr. [name], who advised her to come to the emergency room immediately, which she did.  Upon arrival, she was seen by the emergency room physician, given four baby aspirin, several nitroglycerin, and ultimately a nitroglycerin drip was started to discontinue the chest pain.  She no longer has chest pain at the time of this history (5:20 p.m.) but still felt a very small amount of tightness.  Twenty-five years ago, she was told that she had an MI when an EKG was seen with abnormalities but the patient really did not know that she was having an MI.  She had had some chest pain and only found out about this after the fact.  She was never seen in the hospital.  MEDICATIONS: 1. The patient has a history of high blood pressure and has been on Tenormin    100 mg daily. 2. Verapamil 240 mg daily. 3. Dyazide 75/50 one daily. 4. Celebrex 100 mg daily. 5. Premarin 0.625 mg daily. 6. Allegra D for allergies. 7. Triamcinolone cream for eczema.  REVIEW OF SYSTEMS:  MUSCULOSKELETAL:  The patient has some joint discomforts and has been on Celebrex, as noted above.  PAST MEDICAL HISTORY:  Colonoscopy in 1999, routine, no diagnosis.   The patient had right rotator cuff shoulder surgery September 2000 at [facility] (see enclosed reports which were faxed over from [facility]).  She is post hysterectomy for hemorrhage, no cancer, 1980.  Tonsillectomy 1970.  Appendectomy at age 34.  ALLERGIES:  CODEINE caused rash.  SOCIAL HISTORY:  Retired from [employer], nonsmoker, no alcohol intake.  FAMILY HISTORY:  Mother died at age 7 of myocardial infarction.  A paternal grandmother died of some type of heart problem at age 24.  There was no history of cancer or diabetes in the family.  HEALTH MAINTENANCE:  Immunizations said to be up-to-date.  Mammogram was done February 2002 and said to be okay.  PHYSICAL EXAMINATION:  VITAL SIGNS:  Blood pressure 150/70, pulse 74, respirations 16, temperature was normal.  HEENT:  Head was normal.  Eyes showed pupils to be equal, round and reactive to light.  Extraocular muscles were intact.  ENT exam was negative.  NECK:  Normal.  Carotids were 2+ without bruits.  Thyroid was negative.  There was no jugular venous distention.  CHEST:  Clear.  BREASTS:  Negative.  CARDIOVASCULAR:  Rate and rhythm were normal without murmur, thrill, heave, rub, or gallop.  ABDOMEN:  Soft, flat, and nontender.  No organomegaly or abnormal masses. Bowel sounds were normal.  There were no  abdominal bruits.  Obesity was noted.  EXTREMITIES:  No cyanosis or edema.  Distal circulation was +3 in both feet.  NEUROLOGIC:  Negative.  SKIN:  Unremarkable.  PELVIC/RECTAL:  Deferred.  LABORATORY DATA:  Electrocardiogram showed old inferior wall myocardial infarction.  Chest x-ray showed cardiomegaly without evidence of congestive heart failure.  CBC and CMET were unremarkable.  CK and troponins were negative.  ASSESSMENT:  Angina, unstable, rule out coronary artery disease.  PLAN:  Admit to stabilize, finish rule-out workup, [facility] called, IV heparin and IV nitroglycerin. DD:  03/15/01 TD:   03/16/01 Job: [id number]

## 2011-03-27 NOTE — Discharge Summary (Signed)
[place]  Patient:    [name] [name], [name] [name] Visit Number: [id number] MRN: [id number]          Service Type: MED Location: [id number] Attending Physician:  Levy Sjogren Dictated by:   Kerrie Pleasure, [name] Admit Date:  01/05/2002 Discharge Date: 01/09/2002   CC:         Dr. Levonne Spiller of neurology  [name], [name] [facility]   Discharge Summary  DISCHARGE DIAGNOSES: 1. Delirium and altered mental status changes. 2. Psycho ______ hallucinations. 3. Probable ______ meningitis. 4. Hypertension. 5. Obesity. 6. Gout. 7. Vertigo. 8. Polypharmacy.  DISCHARGE MEDICATIONS:  1. Metoprolol 100 mg b.i.d.  2. Klonodine 0.2 mg b.i.d.  3. Colchicine 0.6 mg b.i.d.  4. Aspirin 325 mg q.d.  5. Lasix 20 mg q.d.  6. Lotensin 20 mg b.i.d.  7. Plavix 75 mg q.d.  8. Celebrex 100 mg b.i.d.  9. Imdur 30 mg q.d. 10. Allegra one b.i.d. 11. Premarin 0.625 mg q.d.  DISPOSITION:  The patient is discharged in good health.  FOLLOWUP:  Dr. [name] at [facility].  During her next visit, Dr. Margarette Canada will likely review her medication needs, as well as her living situation.  The patient apparently has had chronic cough for the past six months that may be associated with ______ and currently, as evident by the fact that she does not have the same thing when she moves out.  It will be important to see if that is responsible for her current nasal sinus congestion, as well as chronic cough, and that will make her reduce the medications she is currently taking, including the Allegra as well as her cough medication.  PROCEDURES PERFORMED: 1. Chest x-ray. 2. CT of the head which was negative. 3. EEG was done, the results are pending. 4. MRI.  Please see the contrasted and non-contrasted CT, looking for abscess    that was negative. 5. Lumbar puncture that was done on the day of admission.  CONSULTATIONS:  Neurology, Dr. Sharene Skeans.  HISTORY OF  PRESENT ILLNESS:  [name] [name] is a 77 year old African-American female who presented on January 05, 2002, with a history of seeing double or hallucinations.  She complained of seeing people in her home, but denies change in her medications, sick contacts, or recent travel.  She was apparently discharged two days earlier after being evaluated and found to have vertigo for unknown etiology.  She also had what appeared to be an angina with a cardiac workup done which was negative.  During this admission she experienced signs of psychosis on exam.  Was alert and oriented x3, but with normal ______.  Her affect was labile.  Speech was non-pressured with a thought content that was appropriate.  Cranial nerves II-XII grossly intact. Motor is 5/5.  Her mini mental status exam then was 23/30, which is good. ADMISSION PHYSICAL EXAMINATION:  VITAL SIGNS:  Blood pressure 234/106, pulse 86, respiratory rate 16, O2 saturation 100% on room air.  GENERAL:  Obese.  CARDIAC:  Normal.  CHEST:  Normal.  ADMISSION LABORATORY DATA:  White count of 8.3, hemoglobin 10.5, platelets 307, MCV 89.8, RDW 14.1.  She had a sodium of 139, potassium 3.3, chloride 105, CO2 24, BUN 8, creatinine 0.8, glucose 112.  Total bilirubin 0.4.  Her alkaline phosphatase was 62, AST 28, ALT 20, total protein 7.8, albumin 3.8. Her urinalysis was negative.  HOSPITAL COURSE:  #1 - MENTAL STATUS CHANGES:  Based on the patients  age and the fact that she is taking medication, a lot of the ______ were brought off, including CVA versus meningitis, vitamin deficiency, seizure disorder, depression with psychotic features, possible dementia, etc.  At that point, an LP was done, and the results of the LP were as follows:  White count 12, red blood cells 16, cryptoantigen was negative.  Segmented neutrophils were negative. Lymphocytes 75, monocytes macrophages were 25, eosinophils 0, other cells were 0.  Glucose was 70, protein was 46.   Gram stain shows white blood cells which are mononuclear, but no organisms were seen.  ______ was started with Rocephin for possible aseptic meningitis was considered.  ID was consulted as well.  In view of her previous of her MRI from 12/31/01, shows no acute disease.  There was mild to moderate vessel changes in the pons as mentioned. As such, other infectious processes were also ______ hepatitis viral serologies was also ordered on the CSF which was negative.  PCR is still pending.  During the course of hospitalization, the patient was placed back on her home medications with the exception of Tessalon pearls, as well as her Allegra D.  The patient reported noticing these changes, and her daughter actually noted the changes first since she started taking her Tessalon pearls, and apparently her klonopin dose was increased from 0.2 a week earlier to 0.3.  As such, the patient improved without major changes in her medications. Mental status changes improved by Saturday of hospitalization.  The consensus is that she may have possibly reacted to maybe overdose of one of her medications, although there is no obvious cause based on the review of her medications.  Her medications as per review, showed that her ______ caused by her Allegra D which is known to cause hallucinations in some cases.  Also the fact that klonodine can have some ______, although she has been on klonodine for a while.  Other possible cause was the starting of Tessalon pearls, which may also have some CNS effect.  The patient was therefore advised to actually review her ______ status, and try and back down some of her medications, and her primary care physician will work on trying to taper down the medications that she does not actually need.  #2 - HYPERTENSION:  As mentioned, the patients blood pressure on admission  was 234/106.  Her scheduled home medications were started in the hospital, following the initial bolus of  1200 mg of labetalol.  So far, her blood pressure has remained stable and normal, and at the time of discharge with systolic of about 140s, and diastolic of 60 to 80.  This gives rise that the feeling that the patient may not have been taking her blood pressure medications as prescribed at home.  The patient was counseled closely, and I advised the patient to actually use a pill box so that she may be able to remember taking her medications without making mistakes.  #3 - SIGNS OF PSYCHOSIS:  This is related to problem #1.  Psych consult was also called for, and actually saw her on the first day and did not think she needed to be admitted.  The patient improved tremendously off of her medications and back to her baseline, psych consult was deemed not probably needed at this point, and the patient was therefore discharged home this morning before the psych consult.  #4 - HYPOKALEMIA:  The patient had some bouts of hypokalemia while on admission.  The etiology is not clear when  she came in.  Could just be the Lasix she is taking.  As such, potassium was repleted, and her potassium level has stayed steady since then.  #5 - CHRONIC COUGH:  As mentioned, the patient has had chronic cough at home for the past six months.  Her chest x-ray was clear.  She was not febrile during this admission.  Based on the review of her history, she apparently started having that after moving into a very old apartment, which stinks according to her.  As such, it is likely that she may be having some allergic rhinitis or reaction to what is in the apartment, which may have been responsible for her nasal congestion, sinus congestion, as well as chronic cough.  I have asked the patient to discuss this with her primary care doctor, and see the possibility of her moving out of the new apartment.  DISCHARGE LABORATORY DATA:  On the day of discharge the patients sodium was 141, potassium 3.5, chloride 107, CO2 26,  BUN 17, creatinine 1.1, glucose 103, calcium 9.3.Dictated by:   Kerrie Pleasure, [name] Attending Physician:  Levy Sjogren DD:  01/09/02 TD:  01/10/02 Job: [id number]

## 2011-03-27 NOTE — Discharge Summary (Signed)
[facility]  Patient:    [name] [name], [name] [name]                     MRN: [id number] Adm. Date:  [id number] Disc. Date: [id number] Attending:  Enrique Sack CC:         [name], [name], [facility]  Meade Maw, [name]   Discharge Summary  PROBLEM LIST: 1. Chest pain. 2. Coronary artery disease.  HISTORY OF PRESENT ILLNESS:  This 77 year old patient gets her regular medical care at [facility] but was seen in the emergency room on Mar 15, 2001, with chest pain.  As the doctor on call for unassigned patients, Dr. Erskine Speed admitted this patient to rule out myocardial infarction.  HOSPITAL COURSE:  The patient was admitted in normal sinus rhythm.  On an IV nitroglycerin drip and with IV heparin, the patient became pain-free. Troponins and CK enzymes, and follow-up cardiograms were unremarkable for any signs of acute myocardial infarction.  Patient was seen in consultation by Dr. Meade Maw, who agreed with her basic management.  By the morning of May 8, the patient had a little bit of minor chest tightness from the night before but, by the time rounds were made at 7:45 a.m., the patient was chest pain-free and chest-tightness free.  The Cardiolite stress test was performed. Ejection fraction was estimated at 64%.  There was an area of apical and lateral abnormality that was consistent with small ischemic focus.  This resolved during the recovery phase of the stress test.  Dr. Fraser Din went over all the details as to the positives and negatives of taking an aggressive approach with cardiac catheterization versus medical management, and the patient chose medical management.  On 03/17/01, the patient was feeling great, was walking around the room without difficulty.  I also reviewed with her the current thinking about possible management of her coronary artery disease and, again, she chose medical management.  On 03/18/01, the patient had been  walking in the hall the entire day before, was free of chest pain, felt great and was ready for discharge.  DISCHARGE DIAGNOSIS:  Coronary artery disease, high blood pressure.  DISCHARGE CONDITION:  Improved.  CONSULTATIONS:  Dr. Fraser Din.  COMPLICATIONS:  None.  PROCEDURES:  Stress Cardiolite.  DISCHARGE MEDICATIONS: 1. Ecotrin one daily. 2. Lopressor 50 mg b.i.d. 3. Tylenol as needed for pain. 4. Premarin 0.625 mg daily. 5. Imdur 30 mg daily. 6. Nitroglycerin as needed for chest pain.  DISCHARGE INSTRUCTIONS:  The patient will return in two weeks to see Dr. Fraser Din.  She is to call when she gets home today and speak to her personal physician.  Dr. [name] at [facility] arranged for follow-up there.  She may also take her other home medications including triamcinolone cream, Allegra D, Premarin, Celebrex she takes for her shoulder pain, but she will not take Dyazide at this time nor the Verapamil. DD:  03/18/01 TD:  03/19/01 Job: [id number]

## 2011-03-27 NOTE — H&P (Signed)
NAME:  [name] [name], [name] [name]                        ACCOUNT NO.:  [id number]   MEDICAL RECORD NO.:  [id number]                   PATIENT TYPE:  INP   LOCATION:  NA                                   FACILITY:  [facility]   PHYSICIAN:  Mila Homer. Sherlean Foot, [name]              DATE OF BIRTH:  11/17/1929   DATE OF ADMISSION:  09/17/2003  DATE OF DISCHARGE:                                HISTORY & PHYSICAL   CHIEF COMPLAINT:  Right knee pain.   HISTORY OF PRESENT ILLNESS:  [name] [name] is a 77 year old African American  female with an 18-year history of right knee pain.  She describes the pain  as a sharp aching pain that is diffusely distributed throughout the knee.  Mechanical symptoms include giving way, catching and locking sensation.  She  has difficulty going up and down stairs and unable to walk long distances.  Currently, she takes Arthrotec and Vicodin to control her pain and this  actually gives her moderate pain relief.  She has tried cortisone injections  into the right knee with pain relief lasting no longer than one month.  She  uses a cane and knee brace to ambulate.  X-rays of the right knee show  severe osteoarthritis of the knee.  Since the patient has failed  conservative treatment and has pain daily over the past 18 years, she will  undergo a right total knee arthroplasty on September 17, 2003 at the [facility].   ALLERGIES:  CODEINE causes hives and itching.   MEDICATIONS:  1. Toprol 100 mg two tablets b.i.d.  2. Arthrotec 75 mg one p.o. b.i.d.  3. Furosemide 20 mg one p.o. daily.  4. Isosorbide 30 mg one p.o. daily.  5. Clarinex 50 mg one daily.  6. Clonidine 0.2 mg one tab b.i.d.  7. Norvasc 10 mg one tab q.a.m.  8. Baby aspirin 81 mg one p.o. daily.  9. Calcium b.i.d.  10.      Vicodin 5/500 mg one tab q.6 h. p.r.n. pain.  11.      Lipitor 10 mg one tab daily.   PAST MEDICAL HISTORY:  1. Right knee osteoarthritis, end-stage.  2. History of MI 20 years ago.  3. Hypertension.  4. History of TIAs, 2002/2003.  5. Hypercholesterolemia.  6. Sinus/allergies.  7. History of type 2 diabetes, March 2004, however, due to weight loss and     diet change, she had an A1c of 6.4.   PAST SURGICAL HISTORY:  1. Appendectomy, 1949.  2. Tonsillectomy in 1970.  3. Hysterectomy in 1980.  4. Right shoulder rotator cuff repair, 2000.   The patient denies any complications involving anesthesia with the above  procedures.  She required no blood products.   SOCIAL HISTORY:  Denies any tobacco or alcohol use.  She is widowed, has two  adult children.  Home:  The patient lives in a [place] home with  zero  steps to the usual entrance.  She is retired.   PRIMARY CARE PHYSICIAN:  Her primary care physician is Dr. [name] and  can be reached at [phone].  Preoperative clearance was obtained from Dr.  Margarette Canada.   FAMILY HISTORY:  Mother deceased of unknown causes; the patient was 62-year-  old whenever her mother passed away; the patient's father deceased at age 58  with heart failure, no other health problems.  The patient has no siblings.   REVIEW OF SYSTEMS:  Review of systems positive for sore throat and sinus  problems one week ago, resolved now.  Denies any chest pain.  She does have  orthopnea and PND.  No shortness of breath with exertion.  Positive for  constipation.  Nocturia x3.  Otherwise, review of systems is negative.   PHYSICAL EXAMINATION:  GENERAL:  Well-developed, well-nourished, overweight  female who walks with a slow antalgic gait on the right.  She has a mild  varus deformity of the right knee.  Mood and affect are appropriate.  The  patient talks easily with the examiner.  The patient's height is 5 feet 5  inches, weight 208 pounds.  VITALS:  Temperature 96.7 degrees Fahrenheit, pulse 74, respiratory rate of  16, blood pressure 124/74.  CARDIAC:  Regular rate and rhythm.  No murmurs, rubs, or gallops heard on  auscultation.   RESPIRATORY:  Lungs are clear to auscultation bilaterally without wheezing  or rhonchi.  ABDOMEN:  Abdomen obese, nontender to palpation.  Bowel sounds heard in four  quadrants.  NECK:  Trachea midline.  No lymphadenopathy.  Her carotids are 2+  bilaterally and are equal and symmetric.  No bruits noted.  No tenderness  along the cervical spine, full range of motion of the cervical spine.  SPINE:  Nontender to palpation over the lumbar and sacral spine.  HEENT:  Normocephalic and atraumatic, without frontal or maxillary sinus  tenderness to palpation.  Conjunctivae are pink bilaterally.  Sclerae are  nonicteric bilaterally.  EOMs are intact.  PERRLA.  No visible external ear  deformities are noted.  TMs pearly and gray bilaterally.  Nose and nasal  septum midline.  Nasal mucosa is pink and moist and no pallor noted.  Buccal  mucosa is pink and moist with good dentition.  Pharynx without erythema or  exudate.  Tongue and uvula are midline.  EXTREMITIES:  Upper extremities:  Full range of motion.  Motor and  vascularly intact.  Radial pulses are 2+ bilaterally and are equal and  symmetric.  Lower extremities:  Bilateral hips -- full range of motion.  Left knee -- full extension, flexion to 120 degrees, valgus-varus stressing  reveals no laxity.  Medial compartment pain with valgus stressing.  McMurray's positive for pain in the lateral compartment.  Right knee -- 0 to  110 degrees of flexion.  Valgus-varus stressing causes pain in the lateral  compartment and there is a mild varus deformity noted.  Mild edema.  McMurray's testing causes pain throughout the knee.  Lower legs:  No edema  noted.  Dorsal pedal pulses are 2+ and are bilaterally equal and symmetric.  EHL and FHL intact.  NEUROLOGIC:  The patient is alert and oriented x3.  Cranial nerves II-XII  are grossly intact.  Upper and lower extremity strength testing against resistance is 5/5 bilaterally.  Deep tendon reflexes are 2+  bilaterally in  the upper and lower extremities.  BREASTS, GENITOURINARY AND RECTAL:  Exams all deferred at this  time.   IMPRESSION:  1. Right knee osteoarthritis.  2. Hypertension.  3. Myocardial infarction 20 years ago.  4. History of transient ischemic attacks, 2002/2003.  5. Hypercholesterolemia.  6. Sinus/allergies.  7. Diabetes mellitus 2, well-controlled with diet and weight loss.   PLAN:  The patient will be admitted to [facility] on September 17, 2003 and undergo a right total knee arthroplasty.      Richardean Canal, P.A.                       Mila Homer. Sherlean Foot, [name]    GC/MEDQ  D:  09/11/2003  T:  09/11/2003  Job:  [id number]

## 2011-08-12 ENCOUNTER — Other Ambulatory Visit: Payer: Self-pay | Admitting: Internal Medicine

## 2011-08-12 DIAGNOSIS — Z1231 Encounter for screening mammogram for malignant neoplasm of breast: Secondary | ICD-10-CM

## 2011-09-11 ENCOUNTER — Ambulatory Visit
Admission: RE | Admit: 2011-09-11 | Discharge: 2011-09-11 | Disposition: A | Discharge status: Home / Self Care | Payer: Medicare Other | Source: Ambulatory Visit | Attending: Internal Medicine | Admitting: Internal Medicine

## 2011-09-11 DIAGNOSIS — Z1231 Encounter for screening mammogram for malignant neoplasm of breast: Secondary | ICD-10-CM

## 2011-12-18 ENCOUNTER — Emergency Department (INDEPENDENT_AMBULATORY_CARE_PROVIDER_SITE_OTHER): Payer: Medicare Other

## 2011-12-18 ENCOUNTER — Emergency Department (INDEPENDENT_AMBULATORY_CARE_PROVIDER_SITE_OTHER)
Admission: EM | Admit: 2011-12-18 | Discharge: 2011-12-18 | Disposition: A | Discharge status: Home / Self Care | Payer: Medicare Other | Source: Home / Self Care | Attending: Family Medicine | Admitting: Family Medicine

## 2011-12-18 ENCOUNTER — Encounter (HOSPITAL_COMMUNITY): Payer: Self-pay | Admitting: *Deleted

## 2011-12-18 DIAGNOSIS — S62339A Displaced fracture of neck of unspecified metacarpal bone, initial encounter for closed fracture: Secondary | ICD-10-CM

## 2011-12-18 HISTORY — DX: Essential (primary) hypertension: I10

## 2011-12-18 NOTE — Progress Notes (Signed)
Orthopedic Tech Progress Note Patient Details:  [name] [name] 1930/01/08 [id number]  Type of Splint: Volar;Other (comment) (foam arm sling) Splint Location: right hand Splint Interventions: Application    Halah Whiteside 12/18/2011, 3:40 PM

## 2011-12-18 NOTE — ED Provider Notes (Signed)
History     CSN: [id number]  Arrival date & time 12/18/11  1322   First MD Initiated Contact with Patient 12/18/11 1356      Chief Complaint  Patient presents with  . Fall    (Consider location/radiation/quality/duration/timing/severity/associated sxs/prior treatment) Patient is a 78 y.o. female presenting with fall. The history is provided by the patient and a relative.  Fall The accident occurred 2 days ago. The fall occurred while walking. She landed on concrete. The point of impact was the right wrist. The pain is present in the right wrist.    Past Medical History  Diagnosis Date  . Hypertension     Past Surgical History  Procedure Date  . Tonsillectomy   . Abdominal hysterectomy   . Knee surgery   . Shoulder surgery     History reviewed. No pertinent family history.  History  Substance Use Topics  . Smoking status: Not on file  . Smokeless tobacco: Not on file  . Alcohol Use:     OB History    Grav Para Term Preterm Abortions TAB SAB Ect Mult Living                  Review of Systems  Constitutional: Negative.   Musculoskeletal: Positive for joint swelling. Negative for back pain.    Allergies  Cetirizine hcl; Codeine; Indomethacin; and Methyldopa  Home Medications   Current Outpatient Rx  Name Route Sig Dispense Refill  . ATORVASTATIN CALCIUM 20 MG PO TABS Oral Take 20 mg by mouth daily.    Marland Kitchen CLONIDINE HCL 0.1 MG PO TABS Oral Take 0.1 mg by mouth 2 (two) times daily.    Marland Kitchen DICLOFENAC SODIUM 1 % TD GEL Topical Apply topically.    . FUROSEMIDE 20 MG PO TABS Oral Take 20 mg by mouth 2 (two) times daily.    Marland Kitchen HYDROCODONE-ACETAMINOPHEN 5-500 MG PO CAPS Oral Take 1 capsule by mouth every 6 (six) hours as needed.    Marland Kitchen METOPROLOL TARTRATE 100 MG PO TABS Oral Take 100 mg by mouth 2 (two) times daily.    . TRIAMCINOLONE ACETONIDE 0.1 % EX CREA Topical Apply topically 2 (two) times daily.    Marland Kitchen VALSARTAN-HYDROCHLOROTHIAZIDE 320-25 MG PO TABS Oral Take 1  tablet by mouth daily.      BP 178/79  Pulse 80  Temp(Src) 98.2 F (36.8 C) (Oral)  Resp 18  SpO2 100%  Physical Exam  Nursing note and vitals reviewed. Constitutional: She appears well-developed and well-nourished.  Musculoskeletal: She exhibits edema and tenderness.       Right wrist: She exhibits decreased range of motion, tenderness, bony tenderness and swelling.       Arms: Skin: Skin is warm and dry.    ED Course  Procedures (including critical care time)  Labs Reviewed - No data to display Dg Wrist Complete Right  12/18/2011  *RADIOLOGY REPORT*  Clinical Data: Fall, wrist pain  RIGHT WRIST - COMPLETE 3+ VIEW  Comparison: None.  Findings: Mildly displaced fracture of the distal fourth metacarpal shaft.  Possible deformity of the scaphoid.  Degenerative changes involving the radiocarpal articulation.  Mild widening of the scapholunate joint.  Associated chondrocalcinosis.  Moderate diffuse soft tissue swelling about the wrist/the dorsum of the hand.  IMPRESSION: Mildly displaced fracture of the distal fourth metacarpal shaft.  Possible deformity of the scaphoid, correlate with point of maximal tenderness to assess for additional acute fracture.  Degenerative changes involving the radiocarpal articulation with associated chondrocalcinosis, suggesting  CPPD arthropathy.  Mild widening of the scapholunate joint.  Original Report Authenticated By: Charline Bills, [name]     1. Fracture of metacarpal neck of right hand, closed       MDM  X-rays reviewed and report per radiologist.         Barkley Bruns, MD 12/18/11 [phone]

## 2011-12-18 NOTE — ED Notes (Signed)
Pt  Fell    2  Days  Ago  -  She  Lost  Her  Balance   She  Uses  A  Cane    She  Did  Not  Black out      Her  r  Hand  Is    Swollen and  Tender to  Touch       She  Is  Awake   And  Alert   She  Reports  Some  Pain in r  Knee  As well

## 2012-02-08 ENCOUNTER — Emergency Department (HOSPITAL_COMMUNITY): Payer: Medicare Other

## 2012-02-08 ENCOUNTER — Emergency Department (HOSPITAL_COMMUNITY)
Admission: EM | Admit: 2012-02-08 | Discharge: 2012-02-08 | Disposition: A | Discharge status: Home / Self Care | Payer: Medicare Other | Attending: Emergency Medicine | Admitting: Emergency Medicine

## 2012-02-08 ENCOUNTER — Encounter (HOSPITAL_COMMUNITY): Payer: Self-pay | Admitting: *Deleted

## 2012-02-08 DIAGNOSIS — I10 Essential (primary) hypertension: Secondary | ICD-10-CM | POA: Insufficient documentation

## 2012-02-08 DIAGNOSIS — IMO0002 Reserved for concepts with insufficient information to code with codable children: Secondary | ICD-10-CM | POA: Insufficient documentation

## 2012-02-08 DIAGNOSIS — W19XXXA Unspecified fall, initial encounter: Secondary | ICD-10-CM | POA: Insufficient documentation

## 2012-02-08 DIAGNOSIS — S8390XA Sprain of unspecified site of unspecified knee, initial encounter: Secondary | ICD-10-CM

## 2012-02-08 DIAGNOSIS — T148XXA Other injury of unspecified body region, initial encounter: Secondary | ICD-10-CM

## 2012-02-08 DIAGNOSIS — Z79899 Other long term (current) drug therapy: Secondary | ICD-10-CM | POA: Insufficient documentation

## 2012-02-08 MED ORDER — HYDROCODONE-ACETAMINOPHEN 5-325 MG PO TABS
1.0000 | ORAL_TABLET | Freq: Once | ORAL | Status: AC
  Administered 2012-02-08: 1 via ORAL
  Filled 2012-02-08: qty 1

## 2012-02-08 MED ORDER — HYDROCODONE-ACETAMINOPHEN 5-500 MG PO TABS: 1.0000 | ORAL_TABLET | Freq: Four times a day (QID) | ORAL | Status: DC | PRN

## 2012-02-08 NOTE — Progress Notes (Signed)
ED CM contacted by ED Rn to assist with home health RN & Aide for home services.  CM obtained information from MD and spoke with pt and dtr in her room.  CM spoke with [facility] staff to confirm fax number of [phone].  Faxed home health orders with face to face, face sheet and EDP notes to [facility] with confirmation at 1700

## 2012-02-08 NOTE — ED Provider Notes (Addendum)
History     CSN: [id number]  Arrival date & time 02/08/12  1311   First MD Initiated Contact with Patient 02/08/12 1413      Chief Complaint  Patient presents with  . Fall  . Knee Pain    (Consider location/radiation/quality/duration/timing/severity/associated sxs/prior treatment) Patient is a 78 y.o. female presenting with knee pain. The history is provided by the patient.  Knee Pain This is a new (She was walking in her right knee gave out on her and she fell directly on her knee onto the concrete.) problem. The current episode started less than 1 hour ago. The problem occurs constantly. The problem has not changed since onset.Associated symptoms comments: Pain is 9/10 and worse with bending.. The symptoms are aggravated by bending and standing. The symptoms are relieved by ice and relaxation. She has tried a cold compress for the symptoms. The treatment provided no relief.    Past Medical History  Diagnosis Date  . Hypertension     Past Surgical History  Procedure Date  . Tonsillectomy   . Abdominal hysterectomy   . Knee surgery   . Shoulder surgery     No family history on file.  History  Substance Use Topics  . Smoking status: Never Smoker   . Smokeless tobacco: Not on file  . Alcohol Use: No    OB History    Grav Para Term Preterm Abortions TAB SAB Ect Mult Living                  Review of Systems  All other systems reviewed and are negative.    Allergies  Cetirizine hcl; Codeine; Indomethacin; and Methyldopa  Home Medications   Current Outpatient Rx  Name Route Sig Dispense Refill  . ATORVASTATIN CALCIUM 20 MG PO TABS Oral Take 20 mg by mouth daily.    Marland Kitchen CLONIDINE HCL 0.1 MG PO TABS Oral Take 0.1 mg by mouth 2 (two) times daily.    Marland Kitchen DICLOFENAC SODIUM 1 % TD GEL Topical Apply topically.    . FUROSEMIDE 20 MG PO TABS Oral Take 20 mg by mouth 2 (two) times daily.    Marland Kitchen HYDROCODONE-ACETAMINOPHEN 5-500 MG PO CAPS Oral Take 1 capsule by mouth every 6  (six) hours as needed.    Marland Kitchen METOPROLOL TARTRATE 100 MG PO TABS Oral Take 100 mg by mouth 2 (two) times daily.    . TRIAMCINOLONE ACETONIDE 0.1 % EX CREA Topical Apply topically 2 (two) times daily.    Marland Kitchen VALSARTAN-HYDROCHLOROTHIAZIDE 320-25 MG PO TABS Oral Take 1 tablet by mouth daily.      BP 184/75  Pulse 73  Temp 98.9 F (37.2 C)  Wt 198 lb (89.812 kg)  SpO2 98%  Physical Exam  Constitutional: She is oriented to person, place, and time. She appears well-developed and well-nourished. No distress.  HENT:  Head: Normocephalic and atraumatic.  Eyes: EOM are normal. Pupils are equal, round, and reactive to light.  Musculoskeletal:       Right knee: She exhibits decreased range of motion, swelling and ecchymosis. tenderness found. Medial joint line tenderness noted.       Midline knee scar  Neurological: She is alert and oriented to person, place, and time.  Skin: Skin is warm and dry. No rash noted. No erythema.    ED Course  Procedures (including critical care time)  Labs Reviewed - No data to display Dg Knee Complete 4 Views Right  02/08/2012  *RADIOLOGY REPORT*  Clinical Data: Knee  pain after fall.  RIGHT KNEE - COMPLETE 4+ VIEW  Comparison: None.  Findings: Post right knee replacement.  No evidence of fracture or loosening of the prosthesis.  Small joint effusion.  This may be related to degenerative changes. Occult fracture not entirely excluded as cause of this joint effusion.  Prominent prepatellar soft tissue swelling suggestive of post traumatic prepatellar bursitis.  IMPRESSION: Prominent prepatellar soft tissue swelling suggestive of post- traumatic prepatellar bursitis.  Please see above.  Original Report Authenticated By: Fuller Canada, [name]     No diagnosis found.    MDM   Mechanical fall today after her right knee gave way. She fell directly on her knee the only pain she has is in her right knee. She's had a prior knee replacement of the right knee and is  currently edematous and ecchymotic. She has normal distal sensation and normal pulses distally. She has no right-sided hip pain and no other signs of injury.  Will get plain films to further evaluate.  3:44 PM swelling but no sign of fracture. Results discussed with patient Will have her followup with [facility]. Pt asking for home assistance with knee since fall and case management to come and speak with pt and family. Gwyneth Sprout, MD 02/08/12 1545  Gwyneth Sprout, MD 02/08/12 1556

## 2012-02-08 NOTE — ED Notes (Signed)
Pt states "I just fell a few weeks ago and fractured my hand, just come out of the splint, had just walked out of the restaurant a while ago, I guess my legs just gave out and I fell again, my knee is swelling";  Pt presents with edema & bruising to right knee, ice pack provided.

## 2012-02-08 NOTE — Discharge Instructions (Signed)
Contusion  A contusion is a deep bruise. Contusions happen when an injury causes bleeding under the skin. Signs of bruising include pain, puffiness (swelling), and discolored skin. The contusion may turn blue, purple, or yellow.  HOME CARE    Put ice on the injured area.   Put ice in a plastic bag.   Place a towel between your skin and the bag.   Leave the ice on for 15 to 20 minutes, 3 to 4 times a day.   Only take medicine as told by your doctor.   Rest the injured area.   If possible, raise (elevate) the injured area to lessen puffiness.  GET HELP RIGHT AWAY IF:    You have more bruising or puffiness.   You have pain that is getting worse.   Your puffiness or pain is not helped by medicine.  MAKE SURE YOU:    Understand these instructions.   Will watch your condition.   Will get help right away if you are not doing well or get worse.  Document Released: 04/13/2008 Document Revised: 10/15/2011 Document Reviewed: 08/31/2011  [employer] Patient Information 2012 [employer].

## 2012-08-15 ENCOUNTER — Other Ambulatory Visit: Payer: Self-pay | Admitting: Internal Medicine

## 2012-08-15 DIAGNOSIS — Z1231 Encounter for screening mammogram for malignant neoplasm of breast: Secondary | ICD-10-CM

## 2012-09-16 ENCOUNTER — Ambulatory Visit
Admission: RE | Admit: 2012-09-16 | Discharge: 2012-09-16 | Disposition: A | Discharge status: Home / Self Care | Payer: Medicare Other | Source: Ambulatory Visit | Attending: Internal Medicine | Admitting: Internal Medicine

## 2012-09-16 DIAGNOSIS — Z1231 Encounter for screening mammogram for malignant neoplasm of breast: Secondary | ICD-10-CM

## 2013-02-07 ENCOUNTER — Other Ambulatory Visit: Payer: Self-pay | Admitting: *Deleted

## 2013-02-07 MED ORDER — HYDROCODONE-ACETAMINOPHEN 5-325 MG PO TABS: ORAL_TABLET | ORAL | Status: DC

## 2013-02-13 ENCOUNTER — Ambulatory Visit: Payer: Self-pay | Admitting: Nurse Practitioner

## 2013-03-08 ENCOUNTER — Encounter: Payer: Self-pay | Admitting: Internal Medicine

## 2013-04-14 ENCOUNTER — Telehealth: Payer: Self-pay | Admitting: *Deleted

## 2013-04-14 NOTE — Telephone Encounter (Signed)
Daughter,Jeanette, # [phone]-- called and stated her mother has had a chronic cough and cant sleep well and is getting fatigued from coughing. Called daughter and she stated that she called on call last night and they told her to try OTC claritin and Robatussin that they thought it was coming from allergies. Daughter agreed and stated that she just didn't want it to interfere with her medications. Told her to check with the pharmacist and they could pull up what she is taking and look to confirm the contraindications. They will be in on Monday for bloodwork and will let us know how she is doing. Daughter agreed.

## 2013-04-17 ENCOUNTER — Encounter: Payer: Self-pay | Admitting: *Deleted

## 2013-04-17 ENCOUNTER — Other Ambulatory Visit: Payer: Medicare Other

## 2013-04-17 DIAGNOSIS — I10 Essential (primary) hypertension: Secondary | ICD-10-CM

## 2013-04-17 DIAGNOSIS — E1365 Other specified diabetes mellitus with hyperglycemia: Secondary | ICD-10-CM

## 2013-04-18 LAB — HEMOGLOBIN A1C
Est. average glucose Bld gHb Est-mCnc: 140 mg/dL
Hgb A1c MFr Bld: 6.5 % — ABNORMAL HIGH (ref 4.8–5.6)

## 2013-04-19 ENCOUNTER — Encounter: Payer: Self-pay | Admitting: *Deleted

## 2013-04-19 ENCOUNTER — Encounter: Payer: Self-pay | Admitting: Internal Medicine

## 2013-04-19 ENCOUNTER — Ambulatory Visit (INDEPENDENT_AMBULATORY_CARE_PROVIDER_SITE_OTHER): Payer: Medicare Other | Admitting: Internal Medicine

## 2013-04-19 VITALS — BP 146/90 | HR 66 | Temp 97.7°F | Resp 14 | Ht 62.0 in | Wt 203.4 lb

## 2013-04-19 DIAGNOSIS — J45909 Unspecified asthma, uncomplicated: Secondary | ICD-10-CM

## 2013-04-19 DIAGNOSIS — M109 Gout, unspecified: Secondary | ICD-10-CM

## 2013-04-19 DIAGNOSIS — E119 Type 2 diabetes mellitus without complications: Secondary | ICD-10-CM

## 2013-04-19 DIAGNOSIS — E785 Hyperlipidemia, unspecified: Secondary | ICD-10-CM

## 2013-04-19 DIAGNOSIS — M1990 Unspecified osteoarthritis, unspecified site: Secondary | ICD-10-CM

## 2013-04-19 DIAGNOSIS — J4520 Mild intermittent asthma, uncomplicated: Secondary | ICD-10-CM

## 2013-04-19 DIAGNOSIS — I10 Essential (primary) hypertension: Secondary | ICD-10-CM

## 2013-04-19 HISTORY — DX: Gout, unspecified: M10.9

## 2013-04-19 MED ORDER — GUAIFENESIN 100 MG/5ML PO LIQD: 200.0000 mg | Freq: Two times a day (BID) | ORAL | Status: DC | PRN

## 2013-04-19 MED ORDER — CLONIDINE HCL 0.2 MG PO TABS: 0.1000 mg | ORAL_TABLET | Freq: Every day | ORAL | Status: DC

## 2013-04-19 MED ORDER — CLONIDINE HCL 0.1 MG PO TABS: 0.1000 mg | ORAL_TABLET | Freq: Every day | ORAL | Status: DC

## 2013-04-19 MED ORDER — ALBUTEROL SULFATE HFA 108 (90 BASE) MCG/ACT IN AERS: 2.0000 | INHALATION_SPRAY | Freq: Four times a day (QID) | RESPIRATORY_TRACT | Status: DC | PRN

## 2013-04-19 MED ORDER — METFORMIN HCL 500 MG PO TABS: 500.0000 mg | ORAL_TABLET | ORAL | Status: DC

## 2013-04-19 NOTE — Progress Notes (Signed)
Subjective:    Patient ID: [name] [name], female    DOB: 1929/12/26, 79 y.o.   MRN: [id number]  HPI She has been coughing and has pain in her ears and neck. Cough is interrupting her sleep. She brings out phlegm ocassionally- clear, no hemoptysis. Denies nay chest pain or SOB. Denies sore throat or runny nose. Denies sinus congestion. Feels constriction of her airways and then is coughing and sneezing Home cbg reviewed. All reading below 101- 84-101 SBP124-150/ DBP 75-89 A1C reviewed 6.5 No recent gout attacks  Review of Systems  Constitutional: Negative for fever, chills and appetite change.  HENT: Negative for congestion.   Respiratory: Positive for cough, chest tightness and wheezing. Negative for choking and shortness of breath.   Cardiovascular: Negative for chest pain, palpitations and leg swelling.  Gastrointestinal: Negative for abdominal pain, diarrhea and constipation.  Genitourinary: Negative for dysuria.  Musculoskeletal: Positive for arthralgias.  Neurological: Negative for dizziness and light-headedness.  Psychiatric/Behavioral: Negative for behavioral problems and agitation.       Objective:   Physical Exam  Constitutional: She is oriented to person, place, and time. No distress.  obese  HENT:  Head: Normocephalic and atraumatic.  Right Ear: External ear normal.  Left Ear: External ear normal.  Nose: Nose normal.  Mouth/Throat: Oropharynx is clear and moist.  Oropharyngeal erythema  Eyes: Conjunctivae are normal. Pupils are equal, round, and reactive to light.  Neck: Normal range of motion. Neck supple. No JVD present. No tracheal deviation present.  Cardiovascular: Normal rate, regular rhythm and normal heart sounds.   No murmur heard. Pulmonary/Chest: Effort normal and breath sounds normal.  Abdominal: Soft. Bowel sounds are normal. She exhibits no distension. There is no tenderness.  Musculoskeletal: Normal range of motion. She exhibits no edema.   Lymphadenopathy:    She has no cervical adenopathy.  Neurological: She is alert and oriented to person, place, and time. She has normal reflexes.  Skin: Skin is warm and dry. She is not diaphoretic.  Psychiatric: She has a normal mood and affect. Her behavior is normal.   12/30/11 MMSE- 24/30  IMMUNIZATIONS:   2011 Pneumonia  Tetanus and Diptheria (within last 10 years)  09/06/2012  Influenza  CONSULTANTS:    Dr Rennis Golden Cardiologist [facility]) Dr Hanley Seamen [facility]hthal[facility]  Dr Tinnie Gens Petrinitz podiatry ([facility])  LABS REVIEWED:  03/30/2012 CBC Wbc 6.3 Rbc 3.91 Hemoglobin 12.1  CMP Glucose 118 Bun 23 Creatinine 0.88   HA1C 6.9  Lipid Panel Cholesterol 132 Triglycerides 132 Hdl 64 Ldl 53  TSH 2.590 Vitamin D 25 Hydroxy 27.5   05/04/2012 Uric Acid  10.1  07/12/12 A1c 6.9 Vit D 26.5 01/12/2013 CBC Wbc 7.2 Rbc 4.00 Hemoglobin 12.7 CMP Glucose Bun 30 Creatinine 0.93 HA1C 6.3 Lipid Panel CHolesterol 139 Triglycerides 115 Hdl 65 Ldl 51 Vitamin D 25 Hydroxy 25.6  04/17/13 a1c 6.5    Assessment & Plan:   DM type 2- with a1c remaining well controlled, will change her metformin to 500 mg every other day for now. Continue asa, statin and ARB  HTN- bp well controlled. Will decrease clonidine to 0.1 mg daily for now and monitor bp  Reactive airway disease- no wheeze on auscultation at present First degree AV block- heart rate improved with reduction of verapamil dose. Continue this for now  Hyperlipidemia- continue current regimen of statin, low fat diet  Gout- no flare ups, continue allopurinol  CAD- remains chest pain free. Continue current regimen and to follow with dr Rennis Golden  OA- under control- will not change current pain regimen

## 2013-05-03 ENCOUNTER — Ambulatory Visit (INDEPENDENT_AMBULATORY_CARE_PROVIDER_SITE_OTHER): Payer: Medicare Other | Admitting: Internal Medicine

## 2013-05-03 ENCOUNTER — Encounter: Payer: Self-pay | Admitting: Internal Medicine

## 2013-05-03 ENCOUNTER — Ambulatory Visit
Admission: RE | Admit: 2013-05-03 | Discharge: 2013-05-03 | Disposition: A | Discharge status: Home / Self Care | Payer: Medicare Other | Source: Ambulatory Visit | Attending: Internal Medicine | Admitting: Internal Medicine

## 2013-05-03 VITALS — BP 190/96 | HR 76 | Temp 97.9°F | Resp 20 | Ht 62.0 in | Wt 207.0 lb

## 2013-05-03 DIAGNOSIS — R609 Edema, unspecified: Secondary | ICD-10-CM

## 2013-05-03 DIAGNOSIS — M109 Gout, unspecified: Secondary | ICD-10-CM

## 2013-05-03 DIAGNOSIS — J209 Acute bronchitis, unspecified: Secondary | ICD-10-CM

## 2013-05-03 HISTORY — DX: Edema, unspecified: R60.9

## 2013-05-03 MED ORDER — FLUTICASONE-SALMETEROL 250-50 MCG/DOSE IN AEPB: 1.0000 | INHALATION_SPRAY | Freq: Two times a day (BID) | RESPIRATORY_TRACT | Status: DC

## 2013-05-03 MED ORDER — MOXIFLOXACIN HCL 400 MG PO TABS: 400.0000 mg | ORAL_TABLET | Freq: Every day | ORAL | Status: DC

## 2013-05-03 MED ORDER — PREDNISONE 50 MG PO TABS: 50.0000 mg | ORAL_TABLET | Freq: Every day | ORAL | Status: DC

## 2013-05-03 MED ORDER — FUROSEMIDE 20 MG PO TABS: 20.0000 mg | ORAL_TABLET | Freq: Two times a day (BID) | ORAL | Status: DC

## 2013-05-03 NOTE — Progress Notes (Signed)
Patient ID: [name] [name], female   DOB: 1930-07-21, 79 y.o.   MRN: [id number]  Chief Complaint  Patient presents with  . Acute Visit    right leg pain, left wrist and thumb pain, still wheezing   HPI- 79 y/o female patient is here with complaints of wheezing for few days that has been worsening. She also has cough with white phlegm and feels congested in her chest. Denies any SOB or chest pain. Has noticed swelling in both her legs for past few days She has also developed acute pain with swelling in left wrist and thumb joint for past few days and pain is worsening. She is taking her allopurinol for her gout No nausea or vomiting No abdominal pian No fever or chills  Review of Systems  Constitutional:       Gained weight  Eyes: Negative for blurred vision.  Cardiovascular: Positive for orthopnea and leg swelling. Negative for chest pain and palpitations.  Gastrointestinal: Negative for heartburn, nausea and vomiting.  Genitourinary: Negative for dysuria.  Skin: Negative for rash.  Neurological: Negative for dizziness, weakness and headaches.  Psychiatric/Behavioral: Negative for depression.   Allergies  Allergen Reactions  . Cetirizine Hcl Other (See Comments)    unknown  . Codeine Other (See Comments)    unknown  . Fish Allergy   . Indomethacin Other (See Comments)    unknown  . Methyldopa Other (See Comments)    unknown   Past Medical History  Diagnosis Date  . Hypertension   . First degree atrioventricular block   . Onychia and paronychia of toe   . Muscle weakness (generalized)   . Osteoarthrosis, unspecified whether generalized or localized, unspecified site   . Allergic rhinitis due to pollen   . Gout, unspecified   . Unspecified vitamin D deficiency   . Disorder of bone and cartilage, unspecified   . Edema   . Other and unspecified hyperlipidemia   . Unspecified essential hypertension   . Coronary atherosclerosis of native coronary artery   . Other malaise  and fatigue   . Cough   . Other fall    BP 190/96  Pulse 76  Temp(Src) 97.9 F (36.6 C) (Oral)  Resp 20  Ht 5\' 2"  (1.575 m)  Wt 207 lb (93.895 kg)  BMI 37.85 kg/m2  SpO2 97%  Repeat BP 170/88  Constitutional: She is oriented to person, place, and time. No distress.  obese  HENT:  Head: Normocephalic and atraumatic.  Right Ear: External ear normal.  Left Ear: External ear normal.   Nose: Nose normal.   Mouth/Throat: Oropharynx is moist.  Oropharyngeal erythema  Eyes: Conjunctivae are normal. Pupils are equal, round, and reactive to light.  Neck: Normal range of motion. Neck supple. No JVD present. No tracheal deviation present.  Cardiovascular: Normal rate, regular rhythm and normal heart sounds.    No murmur heard. Pulmonary/Chest: Effort normal. Has expiratory wheezing bilaterally and decreased breath sounds. Abdominal: Soft. Bowel sounds are normal. She exhibits no distension. There is no tenderness.  Musculoskeletal: Normal range of motion. 1+ pitting edema bilaterally upto knees. Has swelling with redness and tenderness in left MCP of the thumb and left medial wrist area Lymphadenopathy:    She has no cervical adenopathy.  Neurological: She is alert and oriented to person, place, and time. She has normal reflexes.  Skin: Skin is warm and dry. She is not diaphoretic.  Psychiatric: She has a normal mood and affect. Her behavior is normal.  ASSESSMENT/PLAN-  Acute bronchitis- will have her on avelox for one week with a course of prednisone. i have concerns for obstructive lung disease. Will get cxr to assess for infiltrates vs pulmonary edema and also chronic emphysema/ bronchitis changes. Will add advair bid for now to help with bronchodilation and albuterol prn. Reassess in one week  Edema- will get cxr to rule out pulmonary edema. Increase lasix to 40 mg bid for now for 5 days and recheck bmp next visit  Acute gout- continue allopurinol and will have her on a weeks  course of prednisone 50 mg daily for now. Reassess with a recheck of uric acid level in 1 week

## 2013-05-03 NOTE — Patient Instructions (Signed)
You will get a chest xray today  Your water pill lasix is being increased to 40 mg twice a day for 5 days and then decrease it to current dose of 20 mg twice a day  Take your albuterol inhaler every 6 hours for next 5 days and then as needed  Take your antibiotics and steroid dose as prescribed along with the new inhaler

## 2013-05-08 ENCOUNTER — Encounter: Payer: Self-pay | Admitting: *Deleted

## 2013-05-10 ENCOUNTER — Ambulatory Visit (INDEPENDENT_AMBULATORY_CARE_PROVIDER_SITE_OTHER): Payer: Medicare Other | Admitting: Internal Medicine

## 2013-05-10 ENCOUNTER — Encounter: Payer: Self-pay | Admitting: Internal Medicine

## 2013-05-10 VITALS — BP 162/84 | HR 77 | Temp 98.2°F | Resp 18 | Ht 62.0 in | Wt 202.0 lb

## 2013-05-10 DIAGNOSIS — M797 Fibromyalgia: Secondary | ICD-10-CM

## 2013-05-10 DIAGNOSIS — R609 Edema, unspecified: Secondary | ICD-10-CM

## 2013-05-10 DIAGNOSIS — M109 Gout, unspecified: Secondary | ICD-10-CM

## 2013-05-10 DIAGNOSIS — E119 Type 2 diabetes mellitus without complications: Secondary | ICD-10-CM

## 2013-05-10 DIAGNOSIS — IMO0001 Reserved for inherently not codable concepts without codable children: Secondary | ICD-10-CM

## 2013-05-10 MED ORDER — PREGABALIN 75 MG PO CAPS: 75.0000 mg | ORAL_CAPSULE | Freq: Two times a day (BID) | ORAL | Status: DC

## 2013-05-11 ENCOUNTER — Other Ambulatory Visit: Payer: Self-pay | Admitting: Geriatric Medicine

## 2013-05-11 LAB — URIC ACID: Uric Acid: 7.9 mg/dL — ABNORMAL HIGH (ref 2.5–7.1)

## 2013-05-11 LAB — SEDIMENTATION RATE: Sed Rate: 36 mm/hr (ref 0–40)

## 2013-05-13 ENCOUNTER — Encounter: Payer: Self-pay | Admitting: Internal Medicine

## 2013-05-13 NOTE — Progress Notes (Signed)
Patient ID: [name] [name], female   DOB: [birth year], 79 y.o.   MRN: [id number]  Chief Complaint  Patient presents with  . Follow-up    edema and wrist pain  . Muscle Pain   Allergies  Allergen Reactions  . Cetirizine Hcl Other (See Comments)    unknown  . Codeine Other (See Comments)    unknown  . Fish Allergy   . Indomethacin Other (See Comments)    unknown  . Methyldopa Other (See Comments)    unknown   HPI-  79 y/o female patient is here with complaints of diffuse body aches- floating joint pain and muscle aches going on for few weeks now. Her joint pain and swelling in her wrist joint imporved with course of prednisone. But she has pain in other joints at present and muscle aches. Denies any numbness, weakness or tingling. Her chest congestion and cough has improved. No further wheezing.  No difficulty swallowing. Denies any SOB or chest pain. Swelling in her legs have subsided  Review of Systems  Constitutional:        lost weight  Eyes: Negative for blurred vision.  Cardiovascular: Negative for chest pain and palpitations. Leg edema has decreased Gastrointestinal: Negative for heartburn, nausea and vomiting.  Genitourinary: Negative for dysuria.  Skin: Negative for rash.  Neurological: Negative for dizziness, weakness and headaches.  Psychiatric/Behavioral: Negative for depression.     BP 162/84  Pulse 77  Temp(Src) 98.2 F (36.8 C) (Oral)  Resp 18  Ht 5\' 2"  (1.575 m)  Wt 202 lb (91.627 kg)  BMI 36.94 kg/m2  SpO2 95%  Constitutional: She is oriented to person, place, and time. No distress.  obese  HENT:  Head: Normocephalic and atraumatic.  Right Ear: External ear normal.  Left Ear: External ear normal.   Nose: Nose normal.   Mouth/Throat: Oropharynx is clear and moist.  Eyes: Conjunctivae are normal. Pupils are equal, round, and reactive to light.  Neck: Normal range of motion. Neck supple. No JVD present. No tracheal deviation present.  Cardiovascular:  Normal rate, regular rhythm and normal heart sounds.    No murmur heard. Pulmonary/Chest: Effort normal and breath sounds normal.  Abdominal: Soft. Bowel sounds are normal. She exhibits no distension. There is no tenderness.  Musculoskeletal: Normal range of motion. She exhibits no edema. Tenderness elicited on doing ROM in wrist and knee joints. No redness or swelling present. Multiple point muscle group tenderness Lymphadenopathy:    She has no cervical adenopathy.  Neurological: She is alert and oriented to person, place, and time. She has normal reflexes.  Skin: Skin is warm and dry. She is not diaphoretic.  Psychiatric: She has a normal mood and affect. Her behavior is normal.   LABS REVIEWED:   03/30/2012 CBC Wbc 6.3 Rbc 3.91 Hemoglobin 12.1   CMP Glucose 118 Bun 23 Creatinine 0.88      HA1C 6.9   Lipid Panel Cholesterol 132 Triglycerides 132 Hdl 64 Ldl 53   TSH 2.590 Vitamin D 25 Hydroxy 27.5    05/04/2012 Uric Acid  10.1   07/12/12 A1c 6.9 Vit D 26.5 01/12/2013 CBC Wbc 7.2 Rbc 4.00 Hemoglobin 12.7 CMP Glucose Bun 30 Creatinine 0.93 HA1C 6.3 Lipid Panel CHolesterol 139 Triglycerides 115 Hdl 65 Ldl 51 Vitamin D 25 Hydroxy 25.6  04/17/13 a1c 6.5   ASSESSMENT/PLAN-  Diffuse muscle pain- and joint pain with multiple point tenderness. Concerns for fibromyalgia. Will check ESR level and start her on lyrica 75 mg bid for  now and reassess  Edema- has subsided and back on lasix 20 mg po twice daily and tolerating it well.   gout- continue allopurinol. on course of prednisone for 2 more days. Check uric acid level today. If > 6, will increase dose of allopurinol  Diabetes mellitus- cbg under control. Continue asa, statin, ARB and metformin current regimen

## 2013-05-15 ENCOUNTER — Other Ambulatory Visit: Payer: Self-pay | Admitting: Internal Medicine

## 2013-05-15 ENCOUNTER — Telehealth: Payer: Self-pay | Admitting: *Deleted

## 2013-05-15 NOTE — Telephone Encounter (Signed)
Faxed Information to [facility] to be evaluated for Home [facility] to provide assistance with ADLs in setting of obesity, osteoarthritis, Reactive airway disease.  Faxed information for nurse to go out

## 2013-05-16 DIAGNOSIS — R262 Difficulty in walking, not elsewhere classified: Secondary | ICD-10-CM

## 2013-05-16 DIAGNOSIS — M1990 Unspecified osteoarthritis, unspecified site: Secondary | ICD-10-CM

## 2013-05-16 DIAGNOSIS — M2550 Pain in unspecified joint: Secondary | ICD-10-CM

## 2013-05-16 DIAGNOSIS — M109 Gout, unspecified: Secondary | ICD-10-CM

## 2013-05-17 ENCOUNTER — Telehealth: Payer: Self-pay | Admitting: Geriatric Medicine

## 2013-05-17 NOTE — Telephone Encounter (Signed)
Will cut lyrica 75 mg into half and provide half a tab in am and half in the pm. Talked with the daughter over the phone and she understands this. To notify office if no imporvement

## 2013-05-17 NOTE — Telephone Encounter (Signed)
Lyrica is making the patient feel out of it. She cannot take it. She is in a lot of pain and cannot sleep at night. They are not able to even get the patient to get out of the bed due to the pain. The pain is causing her blood pressure to go up. Her daughter, called and asked if there was anything else other than Lyrica to help with the pain. Please advise, thanks.

## 2013-05-18 ENCOUNTER — Telehealth: Payer: Self-pay | Admitting: Geriatric Medicine

## 2013-05-18 MED ORDER — TRAMADOL HCL 50 MG PO TABS: 50.0000 mg | ORAL_TABLET | Freq: Three times a day (TID) | ORAL | Status: DC | PRN

## 2013-05-18 NOTE — Telephone Encounter (Signed)
Lets stop lyrica completely. Please enter lyrica in her allergy list. Provide script for tramadol 50 mg every 8 hours as needed for pain with 2 weeks supply and if no improvement, i would like to see her in the clinic. thanks

## 2013-05-18 NOTE — Telephone Encounter (Signed)
Para March called back and said that the patient started to hallucinate last night. Can they try a different medication other than Lyrica? Please advise, thanks.

## 2013-05-18 NOTE — Telephone Encounter (Signed)
Spoke with [facility]. Sending new prescription to the pharmacy.

## 2013-05-29 ENCOUNTER — Other Ambulatory Visit: Payer: Self-pay | Admitting: Internal Medicine

## 2013-05-29 NOTE — Telephone Encounter (Signed)
Spoke w/ pts daughter, Para March.  Says pt's muscles and joints are hurting.  Told her per Dr. Glade Lloyd, if muscles to discontinue the Lipator..  Dr. Glade Lloyd also gave another Rx for Vicoden, taking 1 per every 8 hrs.  Pt has appt w/ Dr. Glade Lloyd for Wedneday, July 23rd.  Daughter aware... Cdavis

## 2013-05-30 ENCOUNTER — Encounter: Payer: Self-pay | Admitting: Internal Medicine

## 2013-05-30 ENCOUNTER — Ambulatory Visit (INDEPENDENT_AMBULATORY_CARE_PROVIDER_SITE_OTHER): Payer: Medicare Other | Admitting: Internal Medicine

## 2013-05-30 ENCOUNTER — Ambulatory Visit: Payer: Medicare Other | Admitting: Internal Medicine

## 2013-05-30 VITALS — BP 154/82 | HR 71 | Temp 98.2°F | Resp 14 | Ht 62.0 in | Wt 200.0 lb

## 2013-05-30 DIAGNOSIS — E119 Type 2 diabetes mellitus without complications: Secondary | ICD-10-CM

## 2013-05-30 DIAGNOSIS — IMO0001 Reserved for inherently not codable concepts without codable children: Secondary | ICD-10-CM | POA: Insufficient documentation

## 2013-05-30 DIAGNOSIS — M109 Gout, unspecified: Secondary | ICD-10-CM

## 2013-05-30 MED ORDER — PREDNISONE 50 MG PO TABS: ORAL_TABLET | ORAL | Status: DC

## 2013-05-30 NOTE — Progress Notes (Signed)
Patient ID: CORENA TILSON, female   DOB: 1930/07/16, 79 y.o.   MRN: [id number]  Chief Complaint  Patient presents with  . Joint and Muscle Pain   HPI- 79 y/o female patient is here with persistnet complaints of aches now mainly in her arms and hands. she also has swelling in her left wrist and right index MCP. She has tried lyrica but was not able to tolerate it. Her pain is severe and affecting her quality of life. Esr and cbc with cmp were normal. Denies any numbness, weakness or tingling. Swelling in her legs have subsided  Review of Systems   Constitutional:        denies fever, chills Eyes: Negative for blurred vision.   Cardiovascular: Negative for chest pain and palpitations. Leg edema has decreased Gastrointestinal: Negative for heartburn, nausea and vomiting.   Genitourinary: Negative for dysuria.   Skin: Negative for rash.   Neurological: Negative for dizziness, weakness and headaches.   Psychiatric/Behavioral: Negative for depression.      Allergies  Allergen Reactions  . Cetirizine Hcl Other (See Comments)    unknown  . Codeine Other (See Comments)    unknown  . Fish Allergy   . Indomethacin Other (See Comments)    unknown  . Lyrica (Pregabalin)     hallucinations  . Methyldopa Other (See Comments)    unknown   Exam- BP 154/82  Pulse 71  Temp(Src) 98.2 F (36.8 C) (Oral)  Resp 14  Ht 5\' 2"  (1.575 m)  Wt 200 lb (90.719 kg)  BMI 36.57 kg/m2  Constitutional: She is oriented to person, place, and time. No distress.  obese  HENT:  Head: Normocephalic and atraumatic.  Mouth/Throat: Oropharynx is clear and moist.   Eyes: Conjunctivae are normal. Pupils are equal, round, and reactive to light.   Neck: Normal range of motion. Neck supple. No JVD present. No tracheal deviation present.   Cardiovascular: Normal rate, regular rhythm and normal heart sounds.    No murmur heard. Pulmonary/Chest: Effort normal and breath sounds normal.   Abdominal: Soft. Bowel  sounds are normal. She exhibits no distension. There is no tenderness.  Musculoskeletal: limited ROM in both arms. Tenderness on examining muscle groups. Edema at the left wrist joint and right index MCP with tenderness on exam.  Lymphadenopathy:    She has no cervical adenopathy.  Neurological: She is alert and oriented to person, place, and time. She has normal reflexes.   Skin: Skin is warm and dry. She is not diaphoretic.  Psychiatric: She has a normal mood and affect. Her behavior is normal.    ASSESSMENT/PLAN  Diffuse muscle pain- and joint pain with multiple point tenderness. Concerns for fibromyalgia but ESR normal. Did not tolerate lyrica. Will have her see rheumatology with concerns for myositis and ? RA. Have stopped her statin for now. She was seen by ortho recently and is pending rheum referral from their office. Will treat her with prednisone 50 mg daily for 5 days to help subside her inflammation. Also provided script for vicodin 5-325 1 tab q6h prn for now   gout- continue allopurinol. Will recheck uric acid level in a month and if remians elevated, will increase allopurinol to 100 mg bid  Diabetes mellitus- cbg under control. Continue asa, statin, ARB and metformin current regimen

## 2013-05-31 ENCOUNTER — Ambulatory Visit: Payer: Medicare Other | Admitting: Internal Medicine

## 2013-06-13 ENCOUNTER — Ambulatory Visit: Payer: Self-pay | Admitting: Internal Medicine

## 2013-06-14 ENCOUNTER — Other Ambulatory Visit: Payer: Self-pay | Admitting: Geriatric Medicine

## 2013-06-19 ENCOUNTER — Encounter: Payer: Self-pay | Admitting: *Deleted

## 2013-06-20 ENCOUNTER — Other Ambulatory Visit: Payer: Self-pay | Admitting: Internal Medicine

## 2013-06-20 DIAGNOSIS — M069 Rheumatoid arthritis, unspecified: Secondary | ICD-10-CM

## 2013-06-23 ENCOUNTER — Ambulatory Visit: Payer: Medicare Other | Admitting: Internal Medicine

## 2013-06-28 ENCOUNTER — Encounter: Payer: Self-pay | Admitting: Internal Medicine

## 2013-06-29 ENCOUNTER — Ambulatory Visit (INDEPENDENT_AMBULATORY_CARE_PROVIDER_SITE_OTHER): Payer: Medicare Other | Admitting: Internal Medicine

## 2013-06-29 ENCOUNTER — Ambulatory Visit: Payer: Medicare Other | Admitting: Internal Medicine

## 2013-06-29 ENCOUNTER — Encounter: Payer: Self-pay | Admitting: Internal Medicine

## 2013-06-29 VITALS — BP 144/72 | HR 78 | Ht 64.0 in | Wt 200.2 lb

## 2013-06-29 DIAGNOSIS — E785 Hyperlipidemia, unspecified: Secondary | ICD-10-CM

## 2013-06-29 DIAGNOSIS — I2510 Atherosclerotic heart disease of native coronary artery without angina pectoris: Secondary | ICD-10-CM

## 2013-06-29 DIAGNOSIS — I10 Essential (primary) hypertension: Secondary | ICD-10-CM

## 2013-06-29 NOTE — Progress Notes (Signed)
OFFICE NOTE  Chief Complaint:  Routine follow-up  Primary Care Physician: [name] Grout, MD  HPI:  [name] [name] is an 79 year old female we follow for hypertension and dyslipidemia. She also has severe arthritis. Her blood pressures have been well controlled on an extensive regimen; however, I guess she has had, recently, problems with side effects including dry mouth, fatigue, and things probably related to her high-blood-pressure medicines. She told me that you are working to decrease her clonidine and make adjustments to that medicine in your office. Overall, denies any chest pain, worsening shortness of breath, palpitations, presyncope, syncopal symptoms. She was also recently diagnosed with rheumatoid arthritis and may have a component of fibromyalgia. Despite all this her blood pressures been fairly well-controlled. She was recently taken off cholesterol medications due to pain has not gone back on them. She denies any chest pain though, or worsening shortness of breath with exertion.  PMHx:  Past Medical History  Diagnosis Date  . Hypertension   . First degree atrioventricular block   . Onychia and paronychia of toe   . Muscle weakness (generalized)   . Osteoarthrosis, unspecified whether generalized or localized, unspecified site   . Allergic rhinitis due to pollen   . Gout, unspecified   . Unspecified vitamin D deficiency   . Disorder of bone and cartilage, unspecified   . Edema   . Hyperlipidemia   . Unspecified essential hypertension   . Coronary atherosclerosis of native coronary artery   . Other malaise and fatigue   . Cough   . Other fall   . Unspecified vitamin D deficiency   . Prepatellar bursitis     Past Surgical History  Procedure Laterality Date  . Tonsillectomy  08/1969  . Knee surgery  09/2005  . Shoulder surgery  07/1999  . Appendectomy  08/1948  . Abdominal hysterectomy  04/1979  . Transthoracic echocardiogram  2003    EF 55-65%; mild concentric  LVH    FAMHx:  Family History  Problem Relation Age of Onset  . Heart attack Father     SOCHx:   reports that she has never smoked. She has never used smokeless tobacco. She reports that she does not drink alcohol or use illicit drugs.  ALLERGIES:  Allergies  Allergen Reactions  . Cetirizine Hcl Other (See Comments)    unknown  . Codeine Other (See Comments)    unknown  . Fish Allergy   . Indomethacin Other (See Comments)    unknown  . Lyrica [Pregabalin]     hallucinations  . Methyldopa Other (See Comments)    unknown    ROS: A comprehensive review of systems was negative except for: Musculoskeletal: positive for arthralgias and myalgias  HOME MEDS: Current Outpatient Prescriptions  Medication Sig Dispense Refill  . albuterol (PROVENTIL HFA;VENTOLIN HFA) 108 (90 BASE) MCG/ACT inhaler Inhale 2 puffs into the lungs every 6 (six) hours as needed for wheezing.  1 Inhaler  0  . allopurinol (ZYLOPRIM) 100 MG tablet TAKE 1 TABLET BY MOUTH EVERY DAY FOR GOUT  30 tablet  3  . aspirin EC 81 MG tablet Take 81 mg by mouth daily.      . Calcium-Magnesium-Vitamin D (CALCIUM 500 PO) Take one tablet once daily      . Cholecalciferol (VITAMIN D PO) Take by mouth every other day.       . cloNIDine (CATAPRES) 0.1 MG tablet Take 1 tablet (0.1 mg total) by mouth daily.  30 tablet  3  . Fluticasone-Salmeterol (  ADVAIR DISKUS) 250-50 MCG/DOSE AEPB Inhale 1 puff into the lungs 2 (two) times daily.  60 each  0  . furosemide (LASIX) 20 MG tablet       . hydrALAZINE (APRESOLINE) 25 MG tablet TAKE 1 TABLET BY MOUTH TWICE DAILY  60 tablet  3  . HYDROcodone-acetaminophen (NORCO/VICODIN) 5-325 MG per tablet       . isosorbide mononitrate (IMDUR) 30 MG 24 hr tablet TAKE 1 AND 1/2 TABLETS BY MOUTH TWICE DAILY  90 tablet  3  . metFORMIN (GLUCOPHAGE) 500 MG tablet TAKE 1 TABLET BY MOUTH EVERY DAY FOR DIABETES  30 tablet  3  . metoprolol (LOPRESSOR) 100 MG tablet TAKE 1 TABLET BY MOUTH TWICE DAILY  60  tablet  3  . predniSONE (DELTASONE) 5 MG tablet Take 5 mg by mouth daily.       Marland Kitchen triamcinolone cream (KENALOG) 0.1 % APPLY TO AFFECTED AREA TWICE DAILY AS NEEDED  15 g  1  . valsartan-hydrochlorothiazide (DIOVAN-HCT) 160-25 MG per tablet TAKE 1 TABLET BY MOUTH EVERY DAY  30 tablet  3  . verapamil (VERELAN PM) 240 MG 24 hr capsule TAKE 1 CAPSULE BY MOUTH EVERY DAY FOR BLOOD PRESSURE  30 capsule  3  . VOLTAREN 1 % GEL APPLY 3-4 TIMES DAILY  100 g  1   No current facility-administered medications for this visit.    LABS/IMAGING: No results found for this or any previous visit (from the past 48 hour(s)). No results found.  VITALS: BP 144/72  Pulse 78  Ht 5\' 4"  (1.626 m)  Wt 200 lb 3.2 oz (90.81 kg)  BMI 34.35 kg/m2  EXAM: General appearance: alert and no distress Neck: no adenopathy, no carotid bruit, no JVD, supple, symmetrical, trachea midline and thyroid not enlarged, symmetric, no tenderness/mass/nodules Lungs: clear to auscultation bilaterally Heart: regular rate and rhythm, S1, S2 normal, no murmur, click, rub or gallop Abdomen: soft, non-tender; bowel sounds normal; no masses,  no organomegaly Extremities: extremities normal, atraumatic, no cyanosis or edema Pulses: 2+ and symmetric Skin: Skin color, texture, turgor normal. No rashes or lesions Neurologic: Grossly normal  EKG: Normal sinus rhythm at 78  ASSESSMENT: 1. HTN - controlled 2. Hyperlipidemia - not on statin  PLAN: 1.   [name] [name] continues to have problems mostly with a depressed mood and chronic pain including fibromyalgia and rheumatoid arthritis. She's having so much difficulty getting out of bed the she may need in-home assistance. I deferred to her primary care provider for this. Her blood pressure has been fairly stable there is no active cardiac problems. I think this can be easily managed by [name] [name] the and I would recommend followup with me only on an as needed basis.  [name] Nose, MD,  [facility] Attending Cardiologist [facility]  [name] [name] 06/29/2013, 12:29 PM

## 2013-06-29 NOTE — Patient Instructions (Addendum)
Your physician recommends that you schedule a follow-up appointment as needed  

## 2013-06-30 ENCOUNTER — Encounter: Payer: Self-pay | Admitting: Geriatric Medicine

## 2013-07-04 ENCOUNTER — Other Ambulatory Visit: Payer: Self-pay | Admitting: *Deleted

## 2013-07-04 MED ORDER — HYDROCODONE-ACETAMINOPHEN 5-325 MG PO TABS: ORAL_TABLET | ORAL | Status: DC

## 2013-07-11 ENCOUNTER — Encounter: Payer: Self-pay | Admitting: Internal Medicine

## 2013-07-11 ENCOUNTER — Ambulatory Visit (INDEPENDENT_AMBULATORY_CARE_PROVIDER_SITE_OTHER): Payer: Medicare Other | Admitting: Internal Medicine

## 2013-07-11 VITALS — BP 144/80 | HR 74 | Temp 99.2°F | Resp 12 | Ht 62.0 in | Wt 197.0 lb

## 2013-07-11 DIAGNOSIS — I10 Essential (primary) hypertension: Secondary | ICD-10-CM

## 2013-07-11 DIAGNOSIS — Z01818 Encounter for other preprocedural examination: Secondary | ICD-10-CM

## 2013-07-11 DIAGNOSIS — E119 Type 2 diabetes mellitus without complications: Secondary | ICD-10-CM

## 2013-07-11 DIAGNOSIS — M1990 Unspecified osteoarthritis, unspecified site: Secondary | ICD-10-CM

## 2013-07-11 MED ORDER — CLONIDINE HCL 0.1 MG PO TABS: 0.1000 mg | ORAL_TABLET | Freq: Every day | ORAL | Status: DC

## 2013-07-11 NOTE — Progress Notes (Signed)
Patient ID: [name] [name], female   DOB: July 23, 1930, 79 y.o.   MRN: [id number]  Chief Complaint  Patient presents with  . Medical Clearance    Left knee surgery   . Referral    request referral to foot specilist    Allergies  Allergen Reactions  . Cetirizine Hcl Other (See Comments)    unknown  . Codeine Other (See Comments)    unknown  . Fish Allergy   . Indomethacin Other (See Comments)    unknown  . Lyrica [Pregabalin]     hallucinations  . Methyldopa Other (See Comments)    unknown    HPI Patient is scheduled for left total knee arthroplasty and needs medical clearance for this. She has end stage osteoarthritis. She has history of DM controlled with oral hypoglycemics and hypertension.   ROS Denies chest pain or shortness of breath Denies abdominal pain Appetite is good Recently diagnosed with RA Has history of fibromyalgia  Past Medical History  Diagnosis Date  . Hypertension   . First degree atrioventricular block   . Onychia and paronychia of toe   . Muscle weakness (generalized)   . Osteoarthrosis, unspecified whether generalized or localized, unspecified site   . Allergic rhinitis due to pollen   . Gout, unspecified   . Unspecified vitamin D deficiency   . Disorder of bone and cartilage, unspecified   . Edema   . Hyperlipidemia   . Unspecified essential hypertension   . Coronary atherosclerosis of native coronary artery   . Other malaise and fatigue   . Cough   . Other fall   . Unspecified vitamin D deficiency   . Prepatellar bursitis     Past Surgical History  Procedure Laterality Date  . Tonsillectomy  08/1969  . Knee surgery  09/2005  . Shoulder surgery  07/1999  . Appendectomy  08/1948  . Abdominal hysterectomy  04/1979  . Transthoracic echocardiogram  2003    EF 55-65%; mild concentric LVH    Medication reviewed   PHYSICAL EXAM  BP 144/80  Pulse 74  Temp(Src) 99.2 F (37.3 C) (Oral)  Resp 12  Ht 5\' 2"  (1.575 m)  Wt 197 lb  (89.359 kg)  BMI 36.02 kg/m2  SpO2 98%  Constitutional: She is oriented to person, place, and time. No distress.  obese  HENT:  Head: Normocephalic and atraumatic.  Nose: Nose normal.   Mouth/Throat: Oropharynx is clear and moist.  Eyes: Conjunctivae are normal. Pupils are equal, round, and reactive to light.  Neck: Normal range of motion. Neck supple. No JVD present. No tracheal deviation present.  Cardiovascular: Normal rate, regular rhythm and normal heart sounds.    No murmur heard. Pulmonary/Chest: Effort normal and breath sounds normal.  Abdominal: Soft. Bowel sounds are normal. She exhibits no distension. There is no tenderness.  Musculoskeletal: Normal range of motion. She exhibits no edema.  Lymphadenopathy:    She has no cervical adenopathy.  Neurological: She is alert and oriented to person, place, and time. She has normal reflexes.  Skin: Skin is warm and dry. She is not diaphoretic. Has ingrown toe nail Psychiatric: She has a normal mood and affect. Her behavior is normal.   Reviewed ekg- normal sinus rhythm @ 78/min  ASSESSMENT/PLAN  Medical clearance- Patient is at intermediate cardiac risk for this orthopedic procedure. Reviewed EKG. Diabetes is well controlled with oral hypoglycemics and blood pressure is under control. She should be able to undergo the knee replacement surgery. Her aspirin has to  be held a day prior to the surgery.  Dm type 2- continue metfrmin, aspirin, statin and ARB  HTN- continue current bp medications  Ingrown toe nail- podiatry referral provided. No ulcers/ callus

## 2013-07-13 ENCOUNTER — Telehealth: Payer: Self-pay | Admitting: *Deleted

## 2013-07-13 NOTE — Telephone Encounter (Signed)
Faxed pre-operative clearance on 07/13/13

## 2013-07-24 ENCOUNTER — Ambulatory Visit (INDEPENDENT_AMBULATORY_CARE_PROVIDER_SITE_OTHER): Payer: Medicare Other | Admitting: Nurse Practitioner

## 2013-07-24 ENCOUNTER — Other Ambulatory Visit: Payer: Self-pay | Admitting: Orthopedic Surgery

## 2013-07-24 ENCOUNTER — Encounter: Payer: Self-pay | Admitting: Nurse Practitioner

## 2013-07-24 ENCOUNTER — Ambulatory Visit: Payer: Self-pay | Admitting: Nurse Practitioner

## 2013-07-24 VITALS — BP 148/80 | HR 88 | Temp 97.4°F | Resp 18 | Ht 67.0 in | Wt 199.2 lb

## 2013-07-24 DIAGNOSIS — H6692 Otitis media, unspecified, left ear: Secondary | ICD-10-CM

## 2013-07-24 DIAGNOSIS — H6690 Otitis media, unspecified, unspecified ear: Secondary | ICD-10-CM

## 2013-07-24 MED ORDER — BUPIVACAINE LIPOSOME 1.3 % IJ SUSP: 20.0000 mL | Freq: Once | INTRAMUSCULAR | Status: DC

## 2013-07-24 MED ORDER — AMOXICILLIN-POT CLAVULANATE 875-125 MG PO TABS: 1.0000 | ORAL_TABLET | Freq: Two times a day (BID) | ORAL | Status: DC

## 2013-07-24 NOTE — Progress Notes (Signed)
Patient ID: [name] [name], female   DOB: Jun 13, 1930, 79 y.o.   MRN: [id number]   Allergies  Allergen Reactions  . Cetirizine Hcl Other (See Comments)    unknown  . Codeine Other (See Comments)    unknown  . Fish Allergy   . Indomethacin Other (See Comments)    unknown  . Lyrica [Pregabalin]     hallucinations  . Methyldopa Other (See Comments)    unknown    Chief Complaint  Patient presents with  . Acute Visit    started sunday evening    HPI: Patient is a 79 y.o. female seen in the office today for acute visit  Pain in left side of face and around left ear has been off and on for several months but for past few days has been worse. Last night sore throat started; tender on side of face, neck and ear; dry cough; reports feeling full in sinuses on left side and ear on left.  No fevers or chills. No congestion. Denies headache or pain in the back of her head or neck Has trouble moving head to left due to pain in throat  Review of Systems:  Review of Systems  Constitutional: Negative for fever, chills and malaise/fatigue.  HENT: Positive for ear pain and sore throat. Negative for hearing loss, congestion and tinnitus.   Eyes: Negative.   Respiratory: Negative for cough and shortness of breath.   Cardiovascular: Negative for chest pain and palpitations.  Gastrointestinal: Negative for abdominal pain, diarrhea and constipation.  Genitourinary: Negative for dysuria.  Musculoskeletal: Positive for myalgias.  Skin: Negative.   Neurological: Negative for dizziness, tingling, weakness and headaches.     Past Medical History  Diagnosis Date  . Hypertension   . First degree atrioventricular block   . Onychia and paronychia of toe   . Muscle weakness (generalized)   . Osteoarthrosis, unspecified whether generalized or localized, unspecified site   . Allergic rhinitis due to pollen   . Gout, unspecified   . Unspecified vitamin D deficiency   . Disorder of bone and cartilage,  unspecified   . Edema   . Hyperlipidemia   . Unspecified essential hypertension   . Coronary atherosclerosis of native coronary artery   . Other malaise and fatigue   . Cough   . Other fall   . Unspecified vitamin D deficiency   . Prepatellar bursitis    Past Surgical History  Procedure Laterality Date  . Tonsillectomy  08/1969  . Knee surgery  09/2005  . Shoulder surgery  07/1999  . Appendectomy  08/1948  . Abdominal hysterectomy  04/1979  . Transthoracic echocardiogram  2003    EF 55-65%; mild concentric LVH   Social History:   reports that she has never smoked. She has never used smokeless tobacco. She reports that she does not drink alcohol or use illicit drugs.  Family History  Problem Relation Age of Onset  . Heart attack Father     Medications: [name] Medications  New Prescriptions   No medications on file  Previous Medications   ALBUTEROL (PROVENTIL HFA;VENTOLIN HFA) 108 (90 BASE) MCG/ACT INHALER    Inhale 2 puffs into the lungs every 6 (six) hours as needed for wheezing.   ALLOPURINOL (ZYLOPRIM) 100 MG TABLET    TAKE 1 TABLET BY MOUTH EVERY DAY FOR GOUT   ASPIRIN EC 81 MG TABLET    Take 81 mg by mouth daily.   CALCIUM-MAGNESIUM-VITAMIN D (CALCIUM 500 PO)    Take  one tablet once daily   CHOLECALCIFEROL (VITAMIN D PO)    Take by mouth every other day.    CLONIDINE (CATAPRES) 0.1 MG TABLET    Take 1 tablet (0.1 mg total) by mouth daily.   FLUTICASONE-SALMETEROL (ADVAIR DISKUS) 250-50 MCG/DOSE AEPB    Inhale 1 puff into the lungs 2 (two) times daily.   FUROSEMIDE (LASIX) 20 MG TABLET    Take 20 mg by mouth. 1 by mouth daily   HYDRALAZINE (APRESOLINE) 25 MG TABLET    TAKE 1 TABLET BY MOUTH TWICE DAILY   HYDROCODONE-ACETAMINOPHEN (NORCO/VICODIN) 5-325 MG PER TABLET    Take one tablet three times daily as needed for pain   ISOSORBIDE MONONITRATE (IMDUR) 30 MG 24 HR TABLET    TAKE 1 AND 1/2 TABLETS BY MOUTH TWICE DAILY   METFORMIN (GLUCOPHAGE) 500 MG TABLET    TAKE 1  TABLET BY MOUTH EVERY DAY FOR DIABETES   METOPROLOL (LOPRESSOR) 100 MG TABLET    TAKE 1 TABLET BY MOUTH TWICE DAILY   PREDNISONE (DELTASONE) 5 MG TABLET    Take 5 mg by mouth daily.    TRIAMCINOLONE CREAM (KENALOG) 0.1 %    APPLY TO AFFECTED AREA TWICE DAILY AS NEEDED   VALSARTAN-HYDROCHLOROTHIAZIDE (DIOVAN-HCT) 160-25 MG PER TABLET    TAKE 1 TABLET BY MOUTH EVERY DAY   VERAPAMIL (VERELAN PM) 240 MG 24 HR CAPSULE    TAKE 1 CAPSULE BY MOUTH EVERY DAY FOR BLOOD PRESSURE   VOLTAREN 1 % GEL    APPLY 3-4 TIMES DAILY  Modified Medications   No medications on file  Discontinued Medications   No medications on file     Physical Exam:  Filed Vitals:   07/24/13 1609  BP: 148/80  Pulse: 88  Temp: 97.4 F (36.3 C)  TempSrc: Oral  Resp: 18  Height: 5\' 7"  (1.702 m)  Weight: 199 lb 3.2 oz (90.357 kg)  SpO2: 97%   Physical Exam  Constitutional: She is well-developed, well-nourished, and in no distress. No distress.  HENT:  Head: Normocephalic and atraumatic.  Right Ear: Tympanic membrane, external ear and ear canal normal.  Left Ear: Ear canal normal. There is tenderness. No drainage or swelling. Tympanic membrane is injected. Left ear bulging TM: sl. No decreased hearing is noted.  Nose: Nose normal.  Mouth/Throat: Uvula is midline, oropharynx is clear and moist and mucous membranes are normal. No oropharyngeal exudate.  Cardiovascular: Normal rate, regular rhythm, normal heart sounds and intact distal pulses.   Pulmonary/Chest: Effort normal and breath sounds normal. No respiratory distress.  Musculoskeletal: Normal range of motion. She exhibits no edema and no tenderness.  Skin: She is not diaphoretic.    Assessment/Plan 1. Otitis media, left Will start antibiotics due to inner ear infection; pt to follow up in 1 week; to seek medical attention if symptoms get worse; changes in vision;  headaches occur, fevers or chills while on antibotic - amoxicillin-clavulanate (AUGMENTIN) 875-125  MG per tablet; Take 1 tablet by mouth 2 (two) times daily.  Dispense: 20 tablet; Refill: 0 - CBC With differential/Platelet

## 2013-07-24 NOTE — Patient Instructions (Signed)
Follow up in 7-10 days   Otitis Media, Adult A middle ear infection is an infection in the space behind the eardrum. The medical name for this is "otitis media." It may happen after a common cold. It is caused by a germ that starts growing in that space. You may feel swollen glands in your neck on the side of the ear infection. HOME CARE INSTRUCTIONS   Take your medicine as directed until it is gone, even if you feel better after the first few days.  Only take over-the-counter or prescription medicines for pain, discomfort, or fever as directed by your caregiver.  Occasional use of a nasal decongestant a couple times per day may help with discomfort and help the eustachian tube to drain better. Follow up with your caregiver in 10 to 14 days or as directed, to be certain that the infection has cleared. Not keeping the appointment could result in a chronic or permanent injury, pain, hearing loss and disability. If there is any problem keeping the appointment, you must call back to this facility for assistance. SEEK IMMEDIATE MEDICAL CARE IF:   You are not getting better in 2 to 3 days.  You have pain that is not controlled with medication.  You feel worse instead of better.  You cannot use the medication as directed.  You develop swelling, redness or pain around the ear or stiffness in your neck. MAKE SURE YOU:   Understand these instructions.  Will watch your condition.  Will get help right away if you are not doing well or get worse. Document Released: 07/31/2004 Document Revised: 01/18/2012 Document Reviewed: 06/01/2008 [facility] Patient Information 2014 [place], [place].

## 2013-07-25 ENCOUNTER — Telehealth: Payer: Self-pay | Admitting: *Deleted

## 2013-07-25 LAB — CBC WITH DIFFERENTIAL
Basophils Absolute: 0 10*3/uL (ref 0.0–0.2)
Basos: 0 %
Eos: 1 %
Eosinophils Absolute: 0.1 10*3/uL (ref 0.0–0.4)
HCT: 33.6 % — ABNORMAL LOW (ref 34.0–46.6)
Hemoglobin: 11.6 g/dL (ref 11.1–15.9)
Immature Grans (Abs): 0 10*3/uL (ref 0.0–0.1)
Immature Granulocytes: 0 %
Lymphocytes Absolute: 2.8 10*3/uL (ref 0.7–3.1)
Lymphs: 25 %
MCH: 31.2 pg (ref 26.6–33.0)
MCHC: 34.5 g/dL (ref 31.5–35.7)
MCV: 90 fL (ref 79–97)
Monocytes Absolute: 0.7 10*3/uL (ref 0.1–0.9)
Monocytes: 6 %
Neutrophils Absolute: 7.4 10*3/uL — ABNORMAL HIGH (ref 1.4–7.0)
Neutrophils Relative %: 68 %
Platelets: 315 10*3/uL (ref 150–379)
RBC: 3.72 x10E6/uL — ABNORMAL LOW (ref 3.77–5.28)
RDW: 16.3 % — ABNORMAL HIGH (ref 12.3–15.4)
WBC: 11 10*3/uL — ABNORMAL HIGH (ref 3.4–10.8)

## 2013-07-25 NOTE — Telephone Encounter (Signed)
Shows infection; to cont antibiotic (augmentin) twice daily -- may take 3-4 days to show improvement; to call if symptoms get worse; fevers chills; unable to tolerate medication or food; any changes in mental status

## 2013-07-25 NOTE — Telephone Encounter (Signed)
Patient daughter, Elease Hashimoto, called and stated that patient still cannot move neck or head to the left. Concerned about whats going on and wants the bloodwork results. What does she need to do from here? Please advise.

## 2013-07-26 NOTE — Telephone Encounter (Signed)
LMOM to return call.

## 2013-07-27 ENCOUNTER — Encounter (HOSPITAL_COMMUNITY): Payer: Self-pay | Admitting: Pharmacy Technician

## 2013-07-28 ENCOUNTER — Encounter (HOSPITAL_COMMUNITY): Payer: Self-pay

## 2013-07-28 ENCOUNTER — Other Ambulatory Visit: Payer: Self-pay | Admitting: Orthopedic Surgery

## 2013-07-28 ENCOUNTER — Ambulatory Visit (HOSPITAL_COMMUNITY)
Admission: RE | Admit: 2013-07-28 | Discharge: 2013-07-28 | Disposition: A | Discharge status: Home / Self Care | Payer: Medicare Other | Source: Ambulatory Visit | Attending: Orthopedic Surgery | Admitting: Orthopedic Surgery

## 2013-07-28 ENCOUNTER — Encounter (HOSPITAL_COMMUNITY)
Admission: RE | Admit: 2013-07-28 | Discharge: 2013-07-28 | Disposition: A | Discharge status: Home / Self Care | Payer: Medicare Other | Source: Ambulatory Visit | Attending: Orthopedic Surgery | Admitting: Orthopedic Surgery

## 2013-07-28 DIAGNOSIS — Z01812 Encounter for preprocedural laboratory examination: Secondary | ICD-10-CM | POA: Insufficient documentation

## 2013-07-28 DIAGNOSIS — I517 Cardiomegaly: Secondary | ICD-10-CM | POA: Insufficient documentation

## 2013-07-28 DIAGNOSIS — Z01818 Encounter for other preprocedural examination: Secondary | ICD-10-CM | POA: Insufficient documentation

## 2013-07-28 LAB — CBC WITH DIFFERENTIAL/PLATELET
Basophils Absolute: 0 10*3/uL (ref 0.0–0.1)
Basophils Relative: 0 % (ref 0–1)
Eosinophils Absolute: 0.1 10*3/uL (ref 0.0–0.7)
Eosinophils Relative: 1 % (ref 0–5)
HCT: 33.6 % — ABNORMAL LOW (ref 36.0–46.0)
Hemoglobin: 11.3 g/dL — ABNORMAL LOW (ref 12.0–15.0)
Lymphocytes Relative: 17 % (ref 12–46)
Lymphs Abs: 1.6 10*3/uL (ref 0.7–4.0)
MCH: 30.5 pg (ref 26.0–34.0)
MCHC: 33.6 g/dL (ref 30.0–36.0)
MCV: 90.8 fL (ref 78.0–100.0)
Monocytes Absolute: 0.4 10*3/uL (ref 0.1–1.0)
Monocytes Relative: 4 % (ref 3–12)
Neutro Abs: 7.9 10*3/uL — ABNORMAL HIGH (ref 1.7–7.7)
Neutrophils Relative %: 78 % — ABNORMAL HIGH (ref 43–77)
Platelets: 302 10*3/uL (ref 150–400)
RBC: 3.7 MIL/uL — ABNORMAL LOW (ref 3.87–5.11)
RDW: 16.9 % — ABNORMAL HIGH (ref 11.5–15.5)
WBC: 10 10*3/uL (ref 4.0–10.5)

## 2013-07-28 LAB — TYPE AND SCREEN
ABO/RH(D): B POS
Antibody Screen: NEGATIVE

## 2013-07-28 LAB — COMPREHENSIVE METABOLIC PANEL
ALT: 13 U/L (ref 0–35)
AST: 15 U/L (ref 0–37)
Albumin: 3.4 g/dL — ABNORMAL LOW (ref 3.5–5.2)
Alkaline Phosphatase: 58 U/L (ref 39–117)
BUN: 25 mg/dL — ABNORMAL HIGH (ref 6–23)
CO2: 21 mEq/L (ref 19–32)
Calcium: 9.9 mg/dL (ref 8.4–10.5)
Chloride: 94 mEq/L — ABNORMAL LOW (ref 96–112)
Creatinine, Ser: 0.81 mg/dL (ref 0.50–1.10)
GFR calc Af Amer: 77 mL/min — ABNORMAL LOW (ref >90)
GFR calc non Af Amer: 66 mL/min — ABNORMAL LOW (ref >90)
Glucose, Bld: 242 mg/dL — ABNORMAL HIGH (ref 70–99)
Potassium: 4 mEq/L (ref 3.5–5.1)
Sodium: 129 mEq/L — ABNORMAL LOW (ref 135–145)
Total Bilirubin: 0.2 mg/dL — ABNORMAL LOW (ref 0.3–1.2)
Total Protein: 7.4 g/dL (ref 6.0–8.3)

## 2013-07-28 LAB — PROTIME-INR
INR: 0.96 (ref 0.00–1.49)
Prothrombin Time: 12.6 seconds (ref 11.6–15.2)

## 2013-07-28 LAB — SURGICAL PCR SCREEN
MRSA, PCR: NEGATIVE
Staphylococcus aureus: NEGATIVE

## 2013-07-28 LAB — APTT: aPTT: 29 seconds (ref 24–37)

## 2013-07-28 LAB — ABO/RH: ABO/RH(D): B POS

## 2013-07-28 NOTE — Pre-Procedure Instructions (Signed)
[name] [name]  07/28/2013   Your procedure is scheduled on:  Monday, September 29th  Report to [name] at 0645 AM.  Call this number if you have problems the morning of surgery: [phone]   Remember:   Do not eat food or drink liquids after midnight.   Take these medicines the morning of surgery with A SIP OF WATER:  toprol, clonidine, advair, hydralizine, imdur,Tylenol if needed, albuterol if needed,vicodin if needed,   Do not wear jewelry, make-up or nail polish.  Do not wear lotions, powders, or perfumes. You may wear deodorant.  Do not shave 48 hours prior to surgery. Men may shave face and neck.  Do not bring valuables to the hospital.  [facility] is not responsible  for any belongings or valuables.  Contacts, dentures or bridgework may not be worn into surgery.  Leave suitcase in the car. After surgery it may be brought to your room.  For patients admitted to the hospital, checkout time is 11:00 AM the day of discharge.   Patients discharged the day of surgery will not be allowed to drive home.    Special Instructions: Shower using CHG 2 nights before surgery and the night before surgery.  If you shower the day of surgery use CHG.  Use special wash - you have one bottle of CHG for all showers.  You should use approximately 1/3 of the bottle for each shower.   Please read over the following fact sheets that you were given: Pain Booklet, Coughing and Deep Breathing, Blood Transfusion Information, MRSA Information and Surgical Site Infection Prevention

## 2013-07-28 NOTE — Progress Notes (Signed)
Primary physician - Dr. Glade Lloyd Cardiologist - Dr. Rennis Golden ekg aug 2014 in epic No other cardiac testing recently  On augmentin for sinus infection

## 2013-08-03 ENCOUNTER — Ambulatory Visit: Payer: Medicare Other | Admitting: Nurse Practitioner

## 2013-08-03 ENCOUNTER — Other Ambulatory Visit: Payer: Self-pay | Admitting: *Deleted

## 2013-08-03 MED ORDER — AMBULATORY NON FORMULARY MEDICATION: Status: DC

## 2013-08-03 NOTE — Telephone Encounter (Signed)
Daughter called for patient needing a Regular Hospital Bed Mattress. Patient is going in for surgery on Monday and needs it by tomorrow and stated that we needed to call [facility] #: [phone]. Called and spoke with Jonny Ruiz and he stated to fax an order to Fax#: [phone]. Faxed order

## 2013-08-04 ENCOUNTER — Other Ambulatory Visit: Payer: Self-pay | Admitting: *Deleted

## 2013-08-04 ENCOUNTER — Telehealth: Payer: Self-pay | Admitting: *Deleted

## 2013-08-04 MED ORDER — AMBULATORY NON FORMULARY MEDICATION: Status: DC

## 2013-08-04 NOTE — Telephone Encounter (Signed)
[name] requested mattress overlay before surgery. SRice RMA.

## 2013-08-06 MED ORDER — CEFAZOLIN SODIUM-DEXTROSE 2-3 GM-% IV SOLR
2.0000 g | INTRAVENOUS | Status: AC
  Administered 2013-08-07: 2 g via INTRAVENOUS
  Filled 2013-08-06: qty 50

## 2013-08-06 MED ORDER — SODIUM CHLORIDE 0.9 % IV SOLN
1000.0000 mg | INTRAVENOUS | Status: AC
  Administered 2013-08-07: 1000 mg via INTRAVENOUS
  Filled 2013-08-06: qty 10

## 2013-08-07 ENCOUNTER — Encounter (HOSPITAL_COMMUNITY): Payer: Self-pay | Admitting: Anesthesiology

## 2013-08-07 ENCOUNTER — Inpatient Hospital Stay (HOSPITAL_COMMUNITY): Payer: Medicare Other | Admitting: Anesthesiology

## 2013-08-07 ENCOUNTER — Encounter (HOSPITAL_COMMUNITY)
Admission: RE | Disposition: A | Discharge status: Skilled Nursing Facility | Payer: Self-pay | Source: Ambulatory Visit | Attending: Orthopedic Surgery

## 2013-08-07 ENCOUNTER — Inpatient Hospital Stay (HOSPITAL_COMMUNITY)
Admission: RE | Admit: 2013-08-07 | Discharge: 2013-08-10 | DRG: 470 | Disposition: A | Discharge status: Skilled Nursing Facility | Payer: Medicare Other | Source: Ambulatory Visit | Attending: Orthopedic Surgery | Admitting: Orthopedic Surgery

## 2013-08-07 DIAGNOSIS — E669 Obesity, unspecified: Secondary | ICD-10-CM | POA: Diagnosis present

## 2013-08-07 DIAGNOSIS — Z961 Presence of intraocular lens: Secondary | ICD-10-CM

## 2013-08-07 DIAGNOSIS — Z96652 Presence of left artificial knee joint: Secondary | ICD-10-CM

## 2013-08-07 DIAGNOSIS — Z888 Allergy status to other drugs, medicaments and biological substances status: Secondary | ICD-10-CM

## 2013-08-07 DIAGNOSIS — Z8673 Personal history of transient ischemic attack (TIA), and cerebral infarction without residual deficits: Secondary | ICD-10-CM

## 2013-08-07 DIAGNOSIS — Z9849 Cataract extraction status, unspecified eye: Secondary | ICD-10-CM

## 2013-08-07 DIAGNOSIS — E119 Type 2 diabetes mellitus without complications: Secondary | ICD-10-CM | POA: Diagnosis present

## 2013-08-07 DIAGNOSIS — I10 Essential (primary) hypertension: Secondary | ICD-10-CM

## 2013-08-07 DIAGNOSIS — E871 Hypo-osmolality and hyponatremia: Secondary | ICD-10-CM | POA: Diagnosis present

## 2013-08-07 DIAGNOSIS — E1159 Type 2 diabetes mellitus with other circulatory complications: Secondary | ICD-10-CM | POA: Diagnosis present

## 2013-08-07 DIAGNOSIS — I252 Old myocardial infarction: Secondary | ICD-10-CM

## 2013-08-07 DIAGNOSIS — Z96659 Presence of unspecified artificial knee joint: Secondary | ICD-10-CM

## 2013-08-07 DIAGNOSIS — Z91013 Allergy to seafood: Secondary | ICD-10-CM

## 2013-08-07 DIAGNOSIS — I152 Hypertension secondary to endocrine disorders: Secondary | ICD-10-CM | POA: Diagnosis present

## 2013-08-07 DIAGNOSIS — E876 Hypokalemia: Secondary | ICD-10-CM | POA: Diagnosis not present

## 2013-08-07 DIAGNOSIS — Z91018 Allergy to other foods: Secondary | ICD-10-CM

## 2013-08-07 DIAGNOSIS — IMO0002 Reserved for concepts with insufficient information to code with codable children: Secondary | ICD-10-CM

## 2013-08-07 DIAGNOSIS — Z8249 Family history of ischemic heart disease and other diseases of the circulatory system: Secondary | ICD-10-CM

## 2013-08-07 DIAGNOSIS — Z9089 Acquired absence of other organs: Secondary | ICD-10-CM

## 2013-08-07 DIAGNOSIS — I2510 Atherosclerotic heart disease of native coronary artery without angina pectoris: Secondary | ICD-10-CM | POA: Diagnosis present

## 2013-08-07 DIAGNOSIS — M1710 Unilateral primary osteoarthritis, unspecified knee: Principal | ICD-10-CM | POA: Diagnosis present

## 2013-08-07 DIAGNOSIS — Z79899 Other long term (current) drug therapy: Secondary | ICD-10-CM

## 2013-08-07 DIAGNOSIS — Z23 Encounter for immunization: Secondary | ICD-10-CM

## 2013-08-07 DIAGNOSIS — M109 Gout, unspecified: Secondary | ICD-10-CM | POA: Diagnosis present

## 2013-08-07 DIAGNOSIS — E785 Hyperlipidemia, unspecified: Secondary | ICD-10-CM | POA: Diagnosis present

## 2013-08-07 DIAGNOSIS — Z7901 Long term (current) use of anticoagulants: Secondary | ICD-10-CM

## 2013-08-07 DIAGNOSIS — E559 Vitamin D deficiency, unspecified: Secondary | ICD-10-CM | POA: Diagnosis present

## 2013-08-07 DIAGNOSIS — D62 Acute posthemorrhagic anemia: Secondary | ICD-10-CM | POA: Diagnosis not present

## 2013-08-07 HISTORY — PX: TOTAL KNEE ARTHROPLASTY: SHX125

## 2013-08-07 HISTORY — DX: Unspecified osteoarthritis, unspecified site: M19.90

## 2013-08-07 HISTORY — DX: Shortness of breath: R06.02

## 2013-08-07 HISTORY — DX: Type 2 diabetes mellitus without complications: E11.9

## 2013-08-07 LAB — GLUCOSE, CAPILLARY
Glucose-Capillary: 153 mg/dL — ABNORMAL HIGH (ref 70–99)
Glucose-Capillary: 162 mg/dL — ABNORMAL HIGH (ref 70–99)
Glucose-Capillary: 176 mg/dL — ABNORMAL HIGH (ref 70–99)
Glucose-Capillary: 191 mg/dL — ABNORMAL HIGH (ref 70–99)
Glucose-Capillary: 195 mg/dL — ABNORMAL HIGH (ref 70–99)

## 2013-08-07 LAB — CBC
HCT: 30.8 % — ABNORMAL LOW (ref 36.0–46.0)
Hemoglobin: 11 g/dL — ABNORMAL LOW (ref 12.0–15.0)
MCH: 31.5 pg (ref 26.0–34.0)
MCHC: 35.7 g/dL (ref 30.0–36.0)
MCV: 88.3 fL (ref 78.0–100.0)
Platelets: 291 10*3/uL (ref 150–400)
RBC: 3.49 MIL/uL — ABNORMAL LOW (ref 3.87–5.11)
RDW: 16 % — ABNORMAL HIGH (ref 11.5–15.5)
WBC: 12.2 10*3/uL — ABNORMAL HIGH (ref 4.0–10.5)

## 2013-08-07 LAB — HEMOGLOBIN A1C
Hgb A1c MFr Bld: 7.1 % — ABNORMAL HIGH (ref <5.7)
Mean Plasma Glucose: 157 mg/dL — ABNORMAL HIGH (ref <117)

## 2013-08-07 LAB — CREATININE, SERUM
Creatinine, Ser: 0.53 mg/dL (ref 0.50–1.10)
GFR calc Af Amer: >90 mL/min (ref >90)
GFR calc non Af Amer: 87 mL/min — ABNORMAL LOW (ref >90)

## 2013-08-07 SURGERY — ARTHROPLASTY, KNEE, TOTAL
Anesthesia: General | Site: Knee | Laterality: Left | Wound class: Clean

## 2013-08-07 MED ORDER — PREDNISONE 5 MG PO TABS
5.0000 mg | ORAL_TABLET | Freq: Two times a day (BID) | ORAL | Status: DC
  Administered 2013-08-07 – 2013-08-10 (×6): 5 mg via ORAL
  Filled 2013-08-07 (×7): qty 1

## 2013-08-07 MED ORDER — CHLORHEXIDINE GLUCONATE 4 % EX LIQD: 60.0000 mL | Freq: Once | CUTANEOUS | Status: DC

## 2013-08-07 MED ORDER — POLYVINYL ALCOHOL 1.4 % OP SOLN
1.0000 [drp] | OPHTHALMIC | Status: DC | PRN
  Filled 2013-08-07: qty 15

## 2013-08-07 MED ORDER — ACETAMINOPHEN 325 MG PO TABS: 650.0000 mg | ORAL_TABLET | Freq: Four times a day (QID) | ORAL | Status: DC | PRN

## 2013-08-07 MED ORDER — LABETALOL HCL 5 MG/ML IV SOLN
INTRAVENOUS | Status: DC | PRN
  Administered 2013-08-07 (×3): 5 mg via INTRAVENOUS

## 2013-08-07 MED ORDER — METHOCARBAMOL 100 MG/ML IJ SOLN
500.0000 mg | Freq: Four times a day (QID) | INTRAVENOUS | Status: DC | PRN
  Filled 2013-08-07: qty 5

## 2013-08-07 MED ORDER — ARTIFICIAL TEARS OP OINT
TOPICAL_OINTMENT | OPHTHALMIC | Status: DC | PRN
  Administered 2013-08-07: 1 via OPHTHALMIC

## 2013-08-07 MED ORDER — FUROSEMIDE 20 MG PO TABS
20.0000 mg | ORAL_TABLET | Freq: Every day | ORAL | Status: DC
  Filled 2013-08-07: qty 1

## 2013-08-07 MED ORDER — FENTANYL CITRATE 0.05 MG/ML IJ SOLN
INTRAMUSCULAR | Status: AC
  Administered 2013-08-07: 100 ug
  Filled 2013-08-07: qty 2

## 2013-08-07 MED ORDER — SODIUM CHLORIDE 0.9 % IJ SOLN
INTRAMUSCULAR | Status: DC | PRN
  Administered 2013-08-07: 10:00:00

## 2013-08-07 MED ORDER — LIDOCAINE HCL (CARDIAC) 20 MG/ML IV SOLN
INTRAVENOUS | Status: DC | PRN
  Administered 2013-08-07: 100 mg via INTRAVENOUS

## 2013-08-07 MED ORDER — ONDANSETRON HCL 4 MG PO TABS: 4.0000 mg | ORAL_TABLET | Freq: Four times a day (QID) | ORAL | Status: DC | PRN

## 2013-08-07 MED ORDER — MIDAZOLAM HCL 2 MG/2ML IJ SOLN
INTRAMUSCULAR | Status: AC
  Administered 2013-08-07: 2 mg
  Filled 2013-08-07: qty 2

## 2013-08-07 MED ORDER — HYDROCHLOROTHIAZIDE 25 MG PO TABS
25.0000 mg | ORAL_TABLET | Freq: Every day | ORAL | Status: DC
  Administered 2013-08-07 – 2013-08-08 (×2): 25 mg via ORAL
  Filled 2013-08-07 (×2): qty 1

## 2013-08-07 MED ORDER — BUPIVACAINE HCL (PF) 0.5 % IJ SOLN
INTRAMUSCULAR | Status: AC
  Filled 2013-08-07: qty 30

## 2013-08-07 MED ORDER — ALUM & MAG HYDROXIDE-SIMETH 200-200-20 MG/5ML PO SUSP: 30.0000 mL | ORAL | Status: DC | PRN

## 2013-08-07 MED ORDER — MENTHOL 3 MG MT LOZG: 1.0000 | LOZENGE | OROMUCOSAL | Status: DC | PRN

## 2013-08-07 MED ORDER — OXYCODONE HCL 5 MG PO TABS
5.0000 mg | ORAL_TABLET | ORAL | Status: DC | PRN
  Administered 2013-08-07: 10 mg via ORAL
  Administered 2013-08-08 (×2): 5 mg via ORAL
  Administered 2013-08-08: 10 mg via ORAL
  Administered 2013-08-08: 5 mg via ORAL
  Administered 2013-08-09 – 2013-08-10 (×5): 10 mg via ORAL
  Filled 2013-08-07 (×2): qty 2
  Filled 2013-08-07: qty 1
  Filled 2013-08-07: qty 2
  Filled 2013-08-07: qty 1
  Filled 2013-08-07 (×2): qty 2
  Filled 2013-08-07: qty 1
  Filled 2013-08-07 (×2): qty 2

## 2013-08-07 MED ORDER — ACETAMINOPHEN 650 MG RE SUPP: 650.0000 mg | Freq: Four times a day (QID) | RECTAL | Status: DC | PRN

## 2013-08-07 MED ORDER — IRBESARTAN 150 MG PO TABS
150.0000 mg | ORAL_TABLET | Freq: Every day | ORAL | Status: DC
  Administered 2013-08-07 – 2013-08-10 (×4): 150 mg via ORAL
  Filled 2013-08-07 (×4): qty 1

## 2013-08-07 MED ORDER — DOCUSATE SODIUM 100 MG PO CAPS
100.0000 mg | ORAL_CAPSULE | Freq: Two times a day (BID) | ORAL | Status: DC
  Administered 2013-08-07 – 2013-08-10 (×6): 100 mg via ORAL
  Filled 2013-08-07 (×7): qty 1

## 2013-08-07 MED ORDER — INSULIN ASPART 100 UNIT/ML ~~LOC~~ SOLN
0.0000 [IU] | Freq: Three times a day (TID) | SUBCUTANEOUS | Status: DC
  Administered 2013-08-07 – 2013-08-08 (×4): 3 [IU] via SUBCUTANEOUS
  Administered 2013-08-09: 2 [IU] via SUBCUTANEOUS
  Administered 2013-08-09: 3 [IU] via SUBCUTANEOUS
  Administered 2013-08-09 – 2013-08-10 (×2): 2 [IU] via SUBCUTANEOUS
  Administered 2013-08-10: 3 [IU] via SUBCUTANEOUS

## 2013-08-07 MED ORDER — VALSARTAN-HYDROCHLOROTHIAZIDE 160-25 MG PO TABS: 1.0000 | ORAL_TABLET | Freq: Every day | ORAL | Status: DC

## 2013-08-07 MED ORDER — HYDROMORPHONE HCL PF 1 MG/ML IJ SOLN: 1.0000 mg | INTRAMUSCULAR | Status: DC | PRN

## 2013-08-07 MED ORDER — INFLUENZA VAC SPLIT QUAD 0.5 ML IM SUSP
0.5000 mL | INTRAMUSCULAR | Status: AC
  Filled 2013-08-07: qty 0.5

## 2013-08-07 MED ORDER — LABETALOL HCL 5 MG/ML IV SOLN
10.0000 mg | INTRAVENOUS | Status: DC | PRN
  Administered 2013-08-07: 10 mg via INTRAVENOUS
  Filled 2013-08-07: qty 4

## 2013-08-07 MED ORDER — METOPROLOL SUCCINATE ER 100 MG PO TB24
100.0000 mg | ORAL_TABLET | Freq: Every day | ORAL | Status: DC
  Administered 2013-08-08 – 2013-08-10 (×3): 100 mg via ORAL
  Filled 2013-08-07 (×3): qty 1

## 2013-08-07 MED ORDER — ONDANSETRON HCL 4 MG/2ML IJ SOLN: 4.0000 mg | Freq: Once | INTRAMUSCULAR | Status: DC | PRN

## 2013-08-07 MED ORDER — FENTANYL CITRATE 0.05 MG/ML IJ SOLN
INTRAMUSCULAR | Status: DC | PRN
  Administered 2013-08-07 (×10): 25 ug via INTRAVENOUS

## 2013-08-07 MED ORDER — BUPIVACAINE LIPOSOME 1.3 % IJ SUSP
20.0000 mL | INTRAMUSCULAR | Status: DC
  Filled 2013-08-07: qty 20

## 2013-08-07 MED ORDER — BUPIVACAINE-EPINEPHRINE PF 0.5-1:200000 % IJ SOLN
INTRAMUSCULAR | Status: DC | PRN
  Administered 2013-08-07: 30 mL

## 2013-08-07 MED ORDER — METFORMIN HCL 500 MG PO TABS
500.0000 mg | ORAL_TABLET | Freq: Every day | ORAL | Status: DC
  Administered 2013-08-08 – 2013-08-09 (×2): 500 mg via ORAL
  Filled 2013-08-07 (×4): qty 1

## 2013-08-07 MED ORDER — ALBUTEROL SULFATE HFA 108 (90 BASE) MCG/ACT IN AERS: 2.0000 | INHALATION_SPRAY | Freq: Four times a day (QID) | RESPIRATORY_TRACT | Status: DC | PRN

## 2013-08-07 MED ORDER — FLEET ENEMA 7-19 GM/118ML RE ENEM: 1.0000 | ENEMA | Freq: Once | RECTAL | Status: AC | PRN

## 2013-08-07 MED ORDER — ONDANSETRON HCL 4 MG/2ML IJ SOLN: 4.0000 mg | Freq: Four times a day (QID) | INTRAMUSCULAR | Status: DC | PRN

## 2013-08-07 MED ORDER — SODIUM CHLORIDE 0.9 % IR SOLN
Status: DC | PRN
  Administered 2013-08-07: 3000 mL

## 2013-08-07 MED ORDER — BISACODYL 5 MG PO TBEC
5.0000 mg | DELAYED_RELEASE_TABLET | Freq: Every day | ORAL | Status: DC | PRN
  Administered 2013-08-10: 5 mg via ORAL
  Filled 2013-08-07: qty 1

## 2013-08-07 MED ORDER — ISOSORBIDE MONONITRATE ER 30 MG PO TB24
45.0000 mg | ORAL_TABLET | Freq: Every day | ORAL | Status: DC
  Administered 2013-08-08 – 2013-08-10 (×3): 45 mg via ORAL
  Filled 2013-08-07 (×3): qty 1

## 2013-08-07 MED ORDER — SENNOSIDES-DOCUSATE SODIUM 8.6-50 MG PO TABS: 1.0000 | ORAL_TABLET | Freq: Every evening | ORAL | Status: DC | PRN

## 2013-08-07 MED ORDER — 0.9 % SODIUM CHLORIDE (POUR BTL) OPTIME
TOPICAL | Status: DC | PRN
  Administered 2013-08-07: 1000 mL

## 2013-08-07 MED ORDER — PROPOFOL 10 MG/ML IV BOLUS
INTRAVENOUS | Status: DC | PRN
  Administered 2013-08-07: 150 mg via INTRAVENOUS
  Administered 2013-08-07: 50 mg via INTRAVENOUS

## 2013-08-07 MED ORDER — BUPIVACAINE HCL (PF) 0.5 % IJ SOLN
INTRAMUSCULAR | Status: DC | PRN
  Administered 2013-08-07: 30 mL

## 2013-08-07 MED ORDER — PHENYLEPHRINE HCL 10 MG/ML IJ SOLN
INTRAMUSCULAR | Status: DC | PRN
  Administered 2013-08-07 (×3): 80 ug via INTRAVENOUS

## 2013-08-07 MED ORDER — METOCLOPRAMIDE HCL 10 MG PO TABS: 5.0000 mg | ORAL_TABLET | Freq: Three times a day (TID) | ORAL | Status: DC | PRN

## 2013-08-07 MED ORDER — VERAPAMIL HCL ER 240 MG PO TBCR
240.0000 mg | EXTENDED_RELEASE_TABLET | Freq: Every day | ORAL | Status: DC
  Administered 2013-08-07 – 2013-08-10 (×4): 240 mg via ORAL
  Filled 2013-08-07 (×4): qty 1

## 2013-08-07 MED ORDER — OXYCODONE HCL 5 MG/5ML PO SOLN: 5.0000 mg | Freq: Once | ORAL | Status: DC | PRN

## 2013-08-07 MED ORDER — MEPERIDINE HCL 25 MG/ML IJ SOLN: 6.2500 mg | INTRAMUSCULAR | Status: DC | PRN

## 2013-08-07 MED ORDER — ONDANSETRON HCL 4 MG/2ML IJ SOLN
INTRAMUSCULAR | Status: DC | PRN
  Administered 2013-08-07: 4 mg via INTRAVENOUS

## 2013-08-07 MED ORDER — SODIUM CHLORIDE 0.9 % IV SOLN: INTRAVENOUS | Status: DC

## 2013-08-07 MED ORDER — LACTATED RINGERS IV SOLN
INTRAVENOUS | Status: DC
  Administered 2013-08-07: 08:00:00 via INTRAVENOUS

## 2013-08-07 MED ORDER — BUPIVACAINE-EPINEPHRINE (PF) 0.5% -1:200000 IJ SOLN
INTRAMUSCULAR | Status: AC
  Filled 2013-08-07: qty 10

## 2013-08-07 MED ORDER — OXYCODONE HCL 5 MG PO TABS: 5.0000 mg | ORAL_TABLET | Freq: Once | ORAL | Status: DC | PRN

## 2013-08-07 MED ORDER — MOMETASONE FURO-FORMOTEROL FUM 100-5 MCG/ACT IN AERO
2.0000 | INHALATION_SPRAY | Freq: Two times a day (BID) | RESPIRATORY_TRACT | Status: DC
  Administered 2013-08-07 – 2013-08-10 (×6): 2 via RESPIRATORY_TRACT
  Filled 2013-08-07: qty 8.8

## 2013-08-07 MED ORDER — METOCLOPRAMIDE HCL 5 MG/ML IJ SOLN: 5.0000 mg | Freq: Three times a day (TID) | INTRAMUSCULAR | Status: DC | PRN

## 2013-08-07 MED ORDER — PHENOL 1.4 % MT LIQD: 1.0000 | OROMUCOSAL | Status: DC | PRN

## 2013-08-07 MED ORDER — CEFAZOLIN SODIUM-DEXTROSE 2-3 GM-% IV SOLR
2.0000 g | Freq: Four times a day (QID) | INTRAVENOUS | Status: AC
  Administered 2013-08-07 (×2): 2 g via INTRAVENOUS
  Filled 2013-08-07 (×2): qty 50

## 2013-08-07 MED ORDER — HYDRALAZINE HCL 25 MG PO TABS
25.0000 mg | ORAL_TABLET | Freq: Two times a day (BID) | ORAL | Status: DC
  Administered 2013-08-07 – 2013-08-10 (×6): 25 mg via ORAL
  Filled 2013-08-07 (×8): qty 1

## 2013-08-07 MED ORDER — CLONIDINE HCL 0.1 MG PO TABS
0.1000 mg | ORAL_TABLET | Freq: Every day | ORAL | Status: DC
  Administered 2013-08-07 – 2013-08-10 (×4): 0.1 mg via ORAL
  Filled 2013-08-07 (×4): qty 1

## 2013-08-07 MED ORDER — DIPHENHYDRAMINE HCL 12.5 MG/5ML PO ELIX
12.5000 mg | ORAL_SOLUTION | ORAL | Status: DC | PRN
  Administered 2013-08-07: 12.5 mg via ORAL
  Filled 2013-08-07: qty 10

## 2013-08-07 MED ORDER — METHOCARBAMOL 500 MG PO TABS
500.0000 mg | ORAL_TABLET | Freq: Four times a day (QID) | ORAL | Status: DC | PRN
  Administered 2013-08-08 – 2013-08-10 (×3): 500 mg via ORAL
  Filled 2013-08-07 (×3): qty 1

## 2013-08-07 MED ORDER — ZOLPIDEM TARTRATE 5 MG PO TABS: 5.0000 mg | ORAL_TABLET | Freq: Every evening | ORAL | Status: DC | PRN

## 2013-08-07 MED ORDER — HYDROMORPHONE HCL PF 1 MG/ML IJ SOLN: 0.2500 mg | INTRAMUSCULAR | Status: DC | PRN

## 2013-08-07 MED ORDER — OXYCODONE HCL ER 10 MG PO T12A
10.0000 mg | EXTENDED_RELEASE_TABLET | Freq: Two times a day (BID) | ORAL | Status: DC
  Administered 2013-08-07 – 2013-08-08 (×3): 10 mg via ORAL
  Filled 2013-08-07 (×3): qty 1

## 2013-08-07 MED ORDER — ENOXAPARIN SODIUM 30 MG/0.3ML ~~LOC~~ SOLN
30.0000 mg | Freq: Two times a day (BID) | SUBCUTANEOUS | Status: DC
  Administered 2013-08-08 – 2013-08-10 (×5): 30 mg via SUBCUTANEOUS
  Filled 2013-08-07 (×7): qty 0.3

## 2013-08-07 MED ORDER — SODIUM CHLORIDE 0.9 % IV SOLN
INTRAVENOUS | Status: DC
  Administered 2013-08-07: 14:00:00 via INTRAVENOUS

## 2013-08-07 MED ORDER — LACTATED RINGERS IV SOLN
INTRAVENOUS | Status: DC | PRN
  Administered 2013-08-07 (×2): via INTRAVENOUS

## 2013-08-07 MED ORDER — ALLOPURINOL 100 MG PO TABS
100.0000 mg | ORAL_TABLET | Freq: Every day | ORAL | Status: DC
  Administered 2013-08-07 – 2013-08-10 (×4): 100 mg via ORAL
  Filled 2013-08-07 (×4): qty 1

## 2013-08-07 SURGICAL SUPPLY — 65 items
BANDAGE ELASTIC 4 VELCRO ST LF (GAUZE/BANDAGES/DRESSINGS) ×1 IMPLANT
BANDAGE ELASTIC 6 VELCRO ST LF (GAUZE/BANDAGES/DRESSINGS) ×1 IMPLANT
BANDAGE ESMARK 6X9 LF (GAUZE/BANDAGES/DRESSINGS) ×1 IMPLANT
BLADE SAGITTAL 13X1.27X60 (BLADE) ×2 IMPLANT
BLADE SAW SGTL 83.5X18.5 (BLADE) ×2 IMPLANT
BNDG CMPR 9X6 STRL LF SNTH (GAUZE/BANDAGES/DRESSINGS) ×1
BNDG ESMARK 6X9 LF (GAUZE/BANDAGES/DRESSINGS) ×2
BOWL SMART MIX CTS (DISPOSABLE) ×2 IMPLANT
CAP POR TM CP VIT E LN CER HD ×1 IMPLANT
CEMENT BONE SIMPLEX SPEEDSET (Cement) ×4 IMPLANT
CLOTH BEACON ORANGE TIMEOUT ST (SAFETY) ×2 IMPLANT
COVER SURGICAL LIGHT HANDLE (MISCELLANEOUS) ×2 IMPLANT
CUFF TOURNIQUET SINGLE 34IN LL (TOURNIQUET CUFF) ×2 IMPLANT
DRAPE EXTREMITY T 121X128X90 (DRAPE) ×2 IMPLANT
DRAPE INCISE IOBAN 66X45 STRL (DRAPES) ×3 IMPLANT
DRAPE PROXIMA HALF (DRAPES) ×2 IMPLANT
DRAPE U-SHAPE 47X51 STRL (DRAPES) ×2 IMPLANT
DRSG ADAPTIC 3X8 NADH LF (GAUZE/BANDAGES/DRESSINGS) ×2 IMPLANT
DRSG PAD ABDOMINAL 8X10 ST (GAUZE/BANDAGES/DRESSINGS) ×2 IMPLANT
DURAPREP 26ML APPLICATOR (WOUND CARE) ×4 IMPLANT
ELECT REM PT RETURN 9FT ADLT (ELECTROSURGICAL) ×2
ELECTRODE REM PT RTRN 9FT ADLT (ELECTROSURGICAL) ×1 IMPLANT
EVACUATOR 1/8 PVC DRAIN (DRAIN) ×2 IMPLANT
GLOVE BIO SURGEON STRL SZ8.5 (GLOVE) ×1 IMPLANT
GLOVE BIOGEL M 7.0 STRL (GLOVE) ×1 IMPLANT
GLOVE BIOGEL PI IND STRL 7.5 (GLOVE) IMPLANT
GLOVE BIOGEL PI IND STRL 8.5 (GLOVE) ×2 IMPLANT
GLOVE BIOGEL PI INDICATOR 7.5 (GLOVE) ×1
GLOVE BIOGEL PI INDICATOR 8.5 (GLOVE) ×1
GLOVE SURG ORTHO 8.0 STRL STRW (GLOVE) ×4 IMPLANT
GLOVE SURG SS PI 7.0 STRL IVOR (GLOVE) ×1 IMPLANT
GLOVE SURG SS PI 8.5 STRL IVOR (GLOVE) ×1
GLOVE SURG SS PI 8.5 STRL STRW (GLOVE) IMPLANT
GOWN PREVENTION PLUS XLARGE (GOWN DISPOSABLE) ×4 IMPLANT
GOWN STRL NON-REIN LRG LVL3 (GOWN DISPOSABLE) ×3 IMPLANT
HANDPIECE INTERPULSE COAX TIP (DISPOSABLE) ×2
HOOD PEEL AWAY FACE SHEILD DIS (HOOD) ×8 IMPLANT
KIT BASIN OR (CUSTOM PROCEDURE TRAY) ×2 IMPLANT
KIT ROOM TURNOVER OR (KITS) ×2 IMPLANT
MANIFOLD NEPTUNE II (INSTRUMENTS) ×2 IMPLANT
NDL HYPO 21X1.5 SAFETY (NEEDLE) ×1 IMPLANT
NEEDLE 22X1 1/2 (OR ONLY) (NEEDLE) ×2 IMPLANT
NEEDLE HYPO 21X1.5 SAFETY (NEEDLE) ×2 IMPLANT
NS IRRIG 1000ML POUR BTL (IV SOLUTION) ×2 IMPLANT
PACK TOTAL JOINT (CUSTOM PROCEDURE TRAY) ×2 IMPLANT
PAD ARMBOARD 7.5X6 YLW CONV (MISCELLANEOUS) ×3 IMPLANT
PAD CAST 4YDX4 CTTN HI CHSV (CAST SUPPLIES) IMPLANT
PADDING CAST COTTON 4X4 STRL (CAST SUPPLIES) ×2
PADDING CAST COTTON 6X4 STRL (CAST SUPPLIES) ×2 IMPLANT
POSITIONER HEAD PRONE TRACH (MISCELLANEOUS) ×2 IMPLANT
SET HNDPC FAN SPRY TIP SCT (DISPOSABLE) ×1 IMPLANT
SPONGE GAUZE 4X4 12PLY (GAUZE/BANDAGES/DRESSINGS) ×2 IMPLANT
STAPLER VISISTAT 35W (STAPLE) ×2 IMPLANT
SUCTION FRAZIER TIP 10 FR DISP (SUCTIONS) ×2 IMPLANT
SUT BONE WAX W31G (SUTURE) ×2 IMPLANT
SUT VIC AB 0 CTB1 27 (SUTURE) ×4 IMPLANT
SUT VIC AB 1 CT1 27 (SUTURE) ×4
SUT VIC AB 1 CT1 27XBRD ANBCTR (SUTURE) ×2 IMPLANT
SUT VIC AB 2-0 CT1 27 (SUTURE) ×4
SUT VIC AB 2-0 CT1 TAPERPNT 27 (SUTURE) ×2 IMPLANT
SYR CONTROL 10ML LL (SYRINGE) ×2 IMPLANT
TOWEL OR 17X24 6PK STRL BLUE (TOWEL DISPOSABLE) ×2 IMPLANT
TOWEL OR 17X26 10 PK STRL BLUE (TOWEL DISPOSABLE) ×2 IMPLANT
TRAY FOLEY CATH 16FRSI W/METER (SET/KITS/TRAYS/PACK) ×2 IMPLANT
WATER STERILE IRR 1000ML POUR (IV SOLUTION) ×4 IMPLANT

## 2013-08-07 NOTE — Transfer of Care (Signed)
Immediate Anesthesia Transfer of Care Note  Patient: [name] [name]  Procedure(s) Performed: Procedure(s): TOTAL KNEE ARTHROPLASTY- LEFT (Left)  Patient Location: PACU  Anesthesia Type:General  Level of Consciousness: awake, alert  and oriented  Airway & Oxygen Therapy: Patient Spontanous Breathing and Patient connected to nasal cannula oxygen  Post-op Assessment: Report given to PACU RN, Post -op Vital signs reviewed and stable and Patient moving all extremities X 4  Post vital signs: Reviewed and stable  Complications: No apparent anesthesia complications

## 2013-08-07 NOTE — Progress Notes (Signed)
UR COMPLETED  

## 2013-08-07 NOTE — Evaluation (Signed)
Physical Therapy Evaluation Patient Details Name: [name] [name] MRN: [id number] DOB: August 09, 1930 Today's Date: 08/07/2013 Time: 4098-1191 PT Time Calculation (min): 20 min  PT Assessment / Plan / Recommendation History of Present Illness  s/p L TKA  Clinical Impression  Patient is s/p above surgery resulting in functional limitations due to the deficits listed below (see PT Problem List).  Patient will benefit from skilled PT to increase their independence and safety with mobility to allow discharge to the venue listed below.       PT Assessment  Patient needs continued PT services    Follow Up Recommendations  Home health PT;Supervision/Assistance - 24 hour    Does the patient have the potential to tolerate intense rehabilitation      Barriers to Discharge Decreased caregiver support Need clarification as to how much assist is available to her    Equipment Recommendations  Rolling walker with 5" wheels;3in1 (PT)    Recommendations for Other Services OT consult   Frequency 7X/week    Precautions / Restrictions Precautions Precautions: Knee Precaution Comments: Watch for dizziness Restrictions Weight Bearing Restrictions: Yes LLE Weight Bearing: Weight bearing as tolerated   Pertinent Vitals/Pain Some grimace with movement, and reports was uncomfortable in CPM, but did not rate pain Noted increasing dizziness sitting EOB (?orthostasis? Unable to get BP sitting up), so laid back down      Mobility  Bed Mobility Bed Mobility: Rolling Right;Rolling Left;Supine to Sit;Sitting - Scoot to Edge of Bed;Sit to Supine Rolling Right: 4: Min assist;With rail Rolling Left: 4: Min assist;With rail Supine to Sit: 2: Max assist;HOB elevated;With rails Sitting - Scoot to Edge of Bed: 4: Min guard Sit to Supine: 1: +2 Total assist Sit to Supine: Patient Percentage: 50% Details for Bed Mobility Assistance: cues for technique, and max assist to lift trunk to sit; reciprocal scooting  to EOB inefficient; +2 assist back to bed as pt was beginning to feel dizzy Transfers Transfers: Not assessed  Details for Transfer Assistance: Attempted sit to stand, but pt reported increasing dizziness sitting EOB, so decided to assist her back supine    Exercises Total Joint Exercises Quad Sets: AROM;Left;5 reps Heel Slides: AAROM;Left;5 reps   PT Diagnosis: Difficulty walking;Acute pain  PT Problem List: Decreased strength;Decreased range of motion;Decreased activity tolerance;Decreased balance;Decreased mobility;Decreased knowledge of use of DME;Decreased knowledge of precautions;Pain PT Treatment Interventions: DME instruction;Gait training;Functional mobility training;Therapeutic activities;Therapeutic exercise;Patient/family education     PT Goals(Current goals can be found in the care plan section) Acute Rehab PT Goals Patient Stated Goal: get back to doing everything PT Goal Formulation: With patient Time For Goal Achievement: 08/14/13 Potential to Achieve Goals: Good  Visit Information  Last PT Received On: 08/07/13 Assistance Needed: +1 Reason Eval/Treat Not Completed: Other (comment) (Politely declining PT at this time) History of Present Illness: s/p L TKA       Prior Functioning  Home Living Family/patient expects to be discharged to:: Private residence Living Arrangements: Alone Available Help at Discharge: Family;Available PRN/intermittently Type of Home: House Home Access: Level entry Home Layout: One level Home Equipment: None Prior Function Level of Independence: Independent Communication Communication: No difficulties    Cognition  Cognition Arousal/Alertness: Awake/alert Behavior During Therapy: WFL for tasks assessed/performed Overall Cognitive Status: Within Functional Limits for tasks assessed    Extremity/Trunk Assessment Upper Extremity Assessment Upper Extremity Assessment: Overall WFL for tasks assessed Lower Extremity Assessment Lower  Extremity Assessment: LLE deficits/detail LLE Deficits / Details: Decr muscle activation postop; Quad set  present, but weak   Balance Balance Balance Assessed: Yes Static Sitting Balance Static Sitting - Balance Support: Right upper extremity supported;Left upper extremity supported;Feet supported Static Sitting - Level of Assistance: 5: Stand by assistance Static Sitting - Comment/# of Minutes: EOB approx 5 minutes; pt reported increasing dizziness which did not subside with time, so laid back down  End of Session PT - End of Session Equipment Utilized During Treatment: Oxygen Activity Tolerance: Patient limited by fatigue;Other (comment) (limited alos by dizziness with sitting upright) Patient left: in bed;with call bell/phone within reach;with family/visitor present Nurse Communication: Mobility status CPM Left Knee CPM Left Knee: Off Left Knee Flexion (Degrees): 90 Left Knee Extension (Degrees): 0 Additional Comments: Trapeze bar and foot roll  GP     Olen Pel [place], [place] [phone]  08/07/2013, 4:06 PM

## 2013-08-07 NOTE — Progress Notes (Signed)
Patient with high blood pressure prior and during surgery.  Upon arrival to floor BP was 149/66, patient did not take all of her BP medicine this am due to surgery.  Scheduled medication given this afternoon, BP was 183/79 around 1545.  Rechecked at 1900, BP was 198/85.  Call placed to Dr. Tobin Chad on call service, spoke with Dr. [name] covering, instructed to give 2200 hydralazine dose early and he would contact the hospital medicine service for a consult.  Nursing will continue to monitor.

## 2013-08-07 NOTE — Progress Notes (Signed)
PT Cancellation Note  Patient Details Name: [name] [name] MRN: [id number] DOB: [birth year]   Cancelled Treatment:    Reason Eval/Treat Not Completed: Other (comment) (Politely declining PT at this time) Will follow-up later today or tomorrow as time allows; Thanks,  [place], [place] [phone]    Van Clines [facility] 08/07/2013, 2:00 PM

## 2013-08-07 NOTE — Anesthesia Preprocedure Evaluation (Addendum)
Anesthesia Evaluation  Patient identified by MRN, date of birth, ID band Patient awake    Reviewed: Allergy & Precautions, H&P , NPO status , [name] Chart, lab work & pertinent test results  Airway Mallampati: I TM Distance: >3 FB Neck ROM: Full    Dental   Pulmonary          Cardiovascular hypertension, Pt. on medications and Pt. on home beta blockers + CAD and + Past MI + dysrhythmias     Neuro/Psych TIA Neuromuscular disease    GI/Hepatic   Endo/Other  diabetes, Poorly Controlled, Type 2, Oral Hypoglycemic Agents  Renal/GU      Musculoskeletal  (+) Fibromyalgia -  Abdominal   Peds  Hematology   Anesthesia Other Findings   Reproductive/Obstetrics                          Anesthesia Physical Anesthesia Plan  ASA: II  Anesthesia Plan: General   Post-op Pain Management:    Induction: Intravenous  Airway Management Planned: LMA  Additional Equipment:   Intra-op Plan:   Post-operative Plan: Extubation in OR  Informed Consent: I have reviewed the patients History and Physical, chart, labs and discussed the procedure including the risks, benefits and alternatives for the proposed anesthesia with the patient or authorized representative who has indicated his/her understanding and acceptance.   Dental advisory given  Plan Discussed with: CRNA and Surgeon  Anesthesia Plan Comments:        Anesthesia Quick Evaluation

## 2013-08-07 NOTE — Anesthesia Postprocedure Evaluation (Signed)
Anesthesia Post Note  Patient: [name] [name]  Procedure(s) Performed: Procedure(s) (LRB): TOTAL KNEE ARTHROPLASTY- LEFT (Left)  Anesthesia type: general  Patient location: PACU  Post pain: Pain level controlled  Post assessment: [name] Cardiovascular Status Stable  Last Vitals:  Filed Vitals:   08/07/13 1130  BP: 196/96  Pulse: 85  Temp:   Resp: 13    Post vital signs: Reviewed and stable  Level of consciousness: sedated  Complications: No apparent anesthesia complications

## 2013-08-07 NOTE — Preoperative (Signed)
Beta Blockers   Reason not to administer Beta Blockers:Not Applicable 

## 2013-08-07 NOTE — H&P (Signed)
[name] [name] MRN:  [id number] DOB/SEX:  22-Oct-1930/female  CHIEF COMPLAINT:  Painful left Knee  HISTORY: Patient is a 79 y.o. female presented with a history of pain in the left knee. Onset of symptoms was gradual starting several years ago with gradually worsening course since that time. Prior procedures on the knee include none. Patient has been treated conservatively with over-the-counter NSAIDs and activity modification. Patient currently rates pain in the knee at 10 out of 10 with activity. There is no pain at night.  PAST MEDICAL HISTORY: Patient Active Problem List   Diagnosis Date Noted  . Preoperative clearance 07/11/2013  . Myalgia and myositis 05/30/2013  . Fibromyalgia 05/10/2013  . Acute gout 05/03/2013  . Acute bronchitis 05/03/2013  . Edema 05/03/2013  . Reactive airway disease 04/19/2013  . Gout 04/19/2013  . DIABETES MELLITUS, TYPE II 04/27/2007  . HYPERLIPIDEMIA 04/27/2007  . HYPERTENSION 04/27/2007  . ALLERGIC RHINITIS 04/27/2007  . DIVERTICULOSIS, COLON 04/27/2007  . OSTEOARTHRITIS 04/27/2007   Past Medical History  Diagnosis Date  . Hypertension   . First degree atrioventricular block   . Onychia and paronychia of toe   . Muscle weakness (generalized)   . Osteoarthrosis, unspecified whether generalized or localized, unspecified site   . Allergic rhinitis due to pollen   . Gout, unspecified   . Unspecified vitamin D deficiency   . Disorder of bone and cartilage, unspecified   . Edema   . Hyperlipidemia   . Unspecified essential hypertension   . Coronary atherosclerosis of native coronary artery   . Other malaise and fatigue   . Cough   . Other fall   . Unspecified vitamin D deficiency   . Prepatellar bursitis   . Myocardial infarction     40 years ago  . Diabetes mellitus without complication     fasting 90-110s  . TIA (transient ischemic attack)    Past Surgical History  Procedure Laterality Date  . Tonsillectomy  08/1969  . Knee  surgery Right 09/2005  . Shoulder surgery Right 07/1999  . Appendectomy  08/1948  . Abdominal hysterectomy  04/1979  . Transthoracic echocardiogram  2003    EF 55-65%; mild concentric LVH  . Joint replacement Right     total knee     MEDICATIONS:   No prescriptions prior to admission    ALLERGIES:   Allergies  Allergen Reactions  . Cetirizine Hcl Other (See Comments)    unknown  . Codeine Other (See Comments)    unknown  . Fish Allergy   . Indomethacin Other (See Comments)    unknown  . Lyrica [Pregabalin]     hallucinations  . Methyldopa Other (See Comments)    unknown  . Shellfish Allergy Hives  . Tomato Rash    REVIEW OF SYSTEMS:  Pertinent items are noted in HPI.   FAMILY HISTORY:   Family History  Problem Relation Age of Onset  . Heart attack Father     SOCIAL HISTORY:   History  Substance Use Topics  . Smoking status: Never Smoker   . Smokeless tobacco: Never Used  . Alcohol Use: No     EXAMINATION:  Vital signs in last 24 hours:    General appearance: alert, cooperative and no distress Lungs: clear to auscultation bilaterally Heart: regular rate and rhythm, S1, S2 normal, no murmur, click, rub or gallop Abdomen: soft, non-tender; bowel sounds normal; no masses,  no organomegaly Extremities: extremities normal, atraumatic, no cyanosis or edema and Homans sign is negative,  no sign of DVT Pulses: 2+ and symmetric Skin: Skin color, texture, turgor normal. No rashes or lesions Neurologic: Alert and oriented X 3, normal strength and tone. Normal symmetric reflexes. Normal coordination and gait  Musculoskeletal:  ROM 0-115, Ligaments intact,  Imaging Review Plain radiographs demonstrate severe degenerative joint disease of the left knee. The overall alignment is mild valgus. The bone quality appears to be fair for age and reported activity level.  Assessment/Plan: End stage arthritis, left knee   The patient history, physical examination and  imaging studies are consistent with advanced degenerative joint disease of the left knee. The patient has failed conservative treatment.  The clearance notes were reviewed.  After discussion with the patient it was felt that Total Knee Replacement was indicated. The procedure,  risks, and benefits of total knee arthroplasty were presented and reviewed. The risks including but not limited to aseptic loosening, infection, blood clots, vascular injury, stiffness, patella tracking problems complications among others were discussed. The patient acknowledged the explanation, agreed to proceed with the plan.  Tennis Mckinnon 08/07/2013, 6:45 AM

## 2013-08-07 NOTE — Progress Notes (Signed)
Orthopedic Tech Progress Note Patient Details:  [name] [name] 1930/08/15 [id number]  CPM Left Knee CPM Left Knee: On Left Knee Flexion (Degrees): 90 Left Knee Extension (Degrees): 0 Additional Comments: Trapeze bar and foot roll   [name] 08/07/2013, 3:20 PM

## 2013-08-07 NOTE — Anesthesia Procedure Notes (Addendum)
Anesthesia Regional Block:  Femoral nerve block  Pre-Anesthetic Checklist: ,, timeout performed, Correct Patient, Correct Site, Correct Laterality, Correct Procedure, Correct Position, site marked, Risks and benefits discussed,  Surgical consent,  Pre-op evaluation,  At surgeon's request and post-op pain management  Laterality: Left  Prep: chloraprep       Needles:  Injection technique: Single-shot  Needle Type: Echogenic Stimulator Needle     Needle Length: 10cm 10 cm Needle Gauge: 21 and 21 G    Additional Needles:  Procedures: ultrasound guided (picture in chart) and nerve stimulator Femoral nerve block  Nerve Stimulator or Paresthesia:  Response: 0.4 mA,   Additional Responses:   Narrative:  Start time: 08/07/2013 8:10 AM End time: 08/07/2013 8:33 AM Injection made incrementally with aspirations every 5 mL.  Performed by: Personally  Anesthesiologist: Arta Bruce MD  Additional Notes: Monitors applied. Patient sedated. Sterile prep and drape,hand hygiene and sterile gloves were used. Relevant anatomy identified.Needle position confirmed.Local anesthetic injected incrementally after negative aspiration. Local anesthetic spread visualized around nerve(s). Vascular puncture avoided. No complications. Image printed for medical record.The patient tolerated the procedure well.       Femoral nerve block Procedure Name: LMA Insertion Date/Time: 08/07/2013 8:49 AM Performed by: Gayla Medicus Pre-anesthesia Checklist: Patient identified, Timeout performed, Emergency Drugs available, Suction available and Patient being monitored Patient Re-evaluated:Patient Re-evaluated prior to inductionOxygen Delivery Method: Circle system utilized Preoxygenation: Pre-oxygenation with 100% oxygen Intubation Type: IV induction LMA: LMA inserted LMA Size: 4.0 Number of attempts: 1 Placement Confirmation: positive ETCO2 and breath sounds checked- equal and bilateral Tube secured with:  Tape Dental Injury: Teeth and Oropharynx as per pre-operative assessment  Comments: Atraumatic LMA insertion by EMT student. KH

## 2013-08-08 ENCOUNTER — Encounter (HOSPITAL_COMMUNITY): Payer: Self-pay | Admitting: Orthopedic Surgery

## 2013-08-08 DIAGNOSIS — E1159 Type 2 diabetes mellitus with other circulatory complications: Secondary | ICD-10-CM | POA: Diagnosis present

## 2013-08-08 DIAGNOSIS — I10 Essential (primary) hypertension: Secondary | ICD-10-CM

## 2013-08-08 DIAGNOSIS — E871 Hypo-osmolality and hyponatremia: Secondary | ICD-10-CM | POA: Diagnosis present

## 2013-08-08 DIAGNOSIS — E119 Type 2 diabetes mellitus without complications: Secondary | ICD-10-CM

## 2013-08-08 DIAGNOSIS — I152 Hypertension secondary to endocrine disorders: Secondary | ICD-10-CM | POA: Diagnosis present

## 2013-08-08 LAB — BASIC METABOLIC PANEL
BUN: 7 mg/dL (ref 6–23)
CO2: 23 mEq/L (ref 19–32)
Calcium: 8.3 mg/dL — ABNORMAL LOW (ref 8.4–10.5)
Chloride: 88 mEq/L — ABNORMAL LOW (ref 96–112)
Creatinine, Ser: 0.58 mg/dL (ref 0.50–1.10)
GFR calc Af Amer: >90 mL/min (ref >90)
GFR calc non Af Amer: 84 mL/min — ABNORMAL LOW (ref >90)
Glucose, Bld: 180 mg/dL — ABNORMAL HIGH (ref 70–99)
Potassium: 3.1 mEq/L — ABNORMAL LOW (ref 3.5–5.1)
Sodium: 123 mEq/L — ABNORMAL LOW (ref 135–145)

## 2013-08-08 LAB — GLUCOSE, CAPILLARY
Glucose-Capillary: 183 mg/dL — ABNORMAL HIGH (ref 70–99)
Glucose-Capillary: 191 mg/dL — ABNORMAL HIGH (ref 70–99)
Glucose-Capillary: 175 mg/dL — ABNORMAL HIGH (ref 70–99)
Glucose-Capillary: 144 mg/dL — ABNORMAL HIGH (ref 70–99)

## 2013-08-08 LAB — CBC
HCT: 29.5 % — ABNORMAL LOW (ref 36.0–46.0)
Hemoglobin: 10.5 g/dL — ABNORMAL LOW (ref 12.0–15.0)
MCH: 31.2 pg (ref 26.0–34.0)
MCHC: 35.6 g/dL (ref 30.0–36.0)
MCV: 87.5 fL (ref 78.0–100.0)
Platelets: 285 10*3/uL (ref 150–400)
RBC: 3.37 MIL/uL — ABNORMAL LOW (ref 3.87–5.11)
RDW: 15.8 % — ABNORMAL HIGH (ref 11.5–15.5)
WBC: 12.6 10*3/uL — ABNORMAL HIGH (ref 4.0–10.5)

## 2013-08-08 MED ORDER — POTASSIUM CHLORIDE IN NACL 20-0.9 MEQ/L-% IV SOLN
INTRAVENOUS | Status: DC
  Administered 2013-08-08 – 2013-08-09 (×2): via INTRAVENOUS
  Filled 2013-08-08 (×4): qty 1000

## 2013-08-08 NOTE — Progress Notes (Signed)
08/08/13 Patient set up with HHPT with Gordy Clement by MD office. Pt/Ot recommended SNF. Referal made to CSW. Will continue to follow for d/c needs. Deatra Canter RN, BSN, CCM

## 2013-08-08 NOTE — Evaluation (Signed)
Occupational Therapy Evaluation Patient Details Name: LALIA LOUDON MRN: [id number] DOB: 1930-08-07 Today's Date: 08/08/2013 Time: 7846-9629 OT Time Calculation (min): 22 min  OT Assessment / Plan / Recommendation History of present illness s/p L TKA   Clinical Impression   This 79 yo female admitted with above presents to acute OT with problems below. Will benefit from acute OT with follow up at SNF due to progressing slowly.    OT Assessment  Patient needs continued OT Services    Follow Up Recommendations  SNF       Equipment Recommendations  3 in 1 bedside comode       Frequency  Min 2X/week    Precautions / Restrictions Precautions Precautions: Knee;Fall Precaution Comments: Watch for dizziness--mildly when sat EOB, BP stable Restrictions Weight Bearing Restrictions: Yes LLE Weight Bearing: Weight bearing as tolerated   Pertinent Vitals/Pain 5/10 knee; repositioned   ADL  Eating/Feeding: Minimal assistance Where Assessed - Eating/Feeding: Bed level Grooming: Moderate assistance Where Assessed - Grooming: Supported sitting Upper Body Bathing: Moderate assistance Where Assessed - Upper Body Bathing: Supported sitting Lower Body Bathing: Maximal assistance Where Assessed - Lower Body Bathing: Supported sit to stand Upper Body Dressing: Maximal assistance Where Assessed - Upper Body Dressing: Supported sitting Lower Body Dressing: +1 Total assistance Where Assessed - Lower Body Dressing: Supported sit to Pharmacist, hospital: +2 Total assistance Toilet Transfer: Patient Percentage: 50% Statistician Method: Surveyor, minerals:  (Bed (raised) to recliner next to bed) Toileting - Clothing Manipulation and Hygiene: +1 Total assistance (with additional 1 to maintain standing) Where Assessed - Toileting Clothing Manipulation and Hygiene: Standing Equipment Used: Gait belt;Rolling walker Transfers/Ambulation Related to ADLs: total A +2  (pt=50%) sit<>stand, only stand pivot to recliner    OT Diagnosis: Generalized weakness;Acute pain  OT Problem List: Decreased strength;Decreased range of motion;Decreased activity tolerance;Impaired balance (sitting and/or standing);Pain;Obesity;Decreased knowledge of use of DME or AE;Impaired UE functional use OT Treatment Interventions: Self-care/ADL training;Balance training;DME and/or AE instruction;Patient/family education;Therapeutic exercise;Therapeutic activities   OT Goals(Current goals can be found in the care plan section) Acute Rehab OT Goals OT Goal Formulation: With patient Time For Goal Achievement: 08/15/13 Potential to Achieve Goals: Good  Visit Information  Last OT Received On: 08/08/13 Assistance Needed: +2 PT/OT Co-Evaluation/Treatment:  (partial) History of Present Illness: s/p L TKA       Prior Functioning     Home Living Family/patient expects to be discharged to:: Private residence Living Arrangements: Alone Available Help at Discharge: Family;Available 24 hours/day Type of Home: House Home Access: Level entry Home Layout: One level Home Equipment: Hand held shower head;Walker - 4 wheels;Walker - 2 wheels;Cane - single point;Shower seat Prior Function Level of Independence: Independent Communication Communication: No difficulties Dominant Hand: Right         Vision/Perception Vision - History Patient Visual Report: No change from baseline   Cognition  Cognition Arousal/Alertness: Awake/alert Behavior During Therapy: WFL for tasks assessed/performed Overall Cognitive Status: Within Functional Limits for tasks assessed    Extremity/Trunk Assessment Upper Extremity Assessment Upper Extremity Assessment: RUE deficits/detail;LUE deficits/detail RUE Deficits / Details: shoulder flexion 2-/5; rest 4/5 (but unable to sustain strength for pushing up through walker); old surgery on shoulder, crepitus felt RUE Coordination: decreased gross  motor LUE Deficits / Details: shoulder flexion 2-/5; rest 4/5 (but unable to sustain strength for pushing up through walker); crepitus felt LUE Coordination: decreased gross motor     Mobility Bed Mobility Bed Mobility: Supine to Sit;Sitting -  Scoot to Edge of Bed Supine to Sit: 3: Mod assist;HOB elevated Sitting - Scoot to [place] of Bed: 3: Mod assist Transfers Transfers: Sit to Stand;Stand to Sit Sit to Stand: 1: +2 Total assist;With upper extremity assist;From elevated surface;From bed Sit to Stand: Patient Percentage: 50% Stand to Sit: 1: +2 Total assist;With upper extremity assist;With armrests;To chair/3-in-1 Stand to Sit: Patient Percentage: 50% Details for Transfer Assistance: Decreased control for descent and VCs for hand placement           End of Session OT - End of Session Equipment Utilized During Treatment: Gait belt;Rolling walker Activity Tolerance: Patient limited by fatigue Patient left: in chair;with call bell/phone within reach       Evette Georges [phone] 08/08/2013, 9:30 AM

## 2013-08-08 NOTE — Progress Notes (Signed)
Physical Therapy Treatment Patient Details Name: [name] [name] MRN: [id number] DOB: 1930/09/18 Today's Date: 08/08/2013 Time: 1191-4782 PT Time Calculation (min): 42 min  PT Assessment / Plan / Recommendation  History of Present Illness s/p L TKA   PT Comments   Pt able to get OOB this session however continues to be lethargic and unable to ambulate.  VSS throughout session. Highly recommend further therapy prior to d/c home due to overall fatigue, generalized weakness and limitation due UE weakness as well.  Will continue to follow.    Follow Up Recommendations  SNF;Supervision/Assistance - 24 hour     Equipment Recommendations  Rolling walker with 5" wheels;3in1 (PT)    Frequency 7X/week   Progress towards PT Goals Progress towards PT goals: Progressing toward goals (slowly)  Plan Discharge plan needs to be updated    Precautions / Restrictions Precautions Precautions: Knee;Fall Precaution Comments: Watch for dizziness--mildly when sat EOB, BP stable Restrictions Weight Bearing Restrictions: Yes LLE Weight Bearing: Weight bearing as tolerated   Pertinent Vitals/Pain 5/10 pain left knee; repositioned    Mobility  Bed Mobility Bed Mobility: Supine to Sit;Sitting - Scoot to Edge of Bed Supine to Sit: 3: Mod assist;HOB elevated Sitting - Scoot to Edge of Bed: 3: Mod assist Details for Bed Mobility Assistance: (A) to elevate trunk OOB with cues for proper technique and to keep LLE elevated.  Transfers Transfers: Sit to Stand;Stand to Sit Sit to Stand: 1: +2 Total assist;With upper extremity assist;From elevated surface;From bed Sit to Stand: Patient Percentage: 50% Stand to Sit: 1: +2 Total assist;With upper extremity assist;With armrests;To chair/3-in-1 Stand to Sit: Patient Percentage: 50% Details for Transfer Assistance: Decreased control for descent and VCs for hand placement Ambulation/Gait Ambulation/Gait Assistance: 4: Min assist Ambulation Distance (Feet): 4  Feet (side steps to recliner) Assistive device: Rolling walker Ambulation/Gait Assistance Details: (A) to maintain balance with max cues for step sequence and proper weight shift.  Gait Pattern: Step-to pattern;Decreased stride length;Shuffle;Trunk flexed Gait velocity: decreased gait Stairs: No    Exercises Total Joint Exercises Quad Sets: AROM;Left;5 reps Heel Slides: AAROM;Left;5 reps General Exercises - Lower Extremity Gluteal Sets: Strengthening;10 reps;Seated   PT Diagnosis:    PT Problem List:   PT Treatment Interventions:     PT Goals (current goals can now be found in the care plan section) Acute Rehab PT Goals Patient Stated Goal: get back to doing everything PT Goal Formulation: With patient Time For Goal Achievement: 08/14/13 Potential to Achieve Goals: Good  Visit Information  Last PT Received On: 08/08/13 Assistance Needed: +2 PT/OT Co-Evaluation/Treatment: Yes History of Present Illness: s/p L TKA    Subjective Data  Patient Stated Goal: get back to doing everything   Cognition  Cognition Arousal/Alertness: Awake/alert Behavior During Therapy: WFL for tasks assessed/performed Overall Cognitive Status: Within Functional Limits for tasks assessed    Balance  Balance Balance Assessed: Yes Static Sitting Balance Static Sitting - Balance Support: Right upper extremity supported;Left upper extremity supported;Feet supported Static Sitting - Level of Assistance: 5: Stand by assistance Static Standing Balance Static Standing - Balance Support: Bilateral upper extremity supported Static Standing - Level of Assistance: 4: Min assist;5: Stand by assistance Static Standing - Comment/# of Minutes: Max cues for upright posture; pt continues to lean forward.   End of Session PT - End of Session Equipment Utilized During Treatment: Gait belt Activity Tolerance: Patient limited by pain;Patient limited by fatigue Patient left: in chair;with call bell/phone within  reach Nurse Communication: Mobility  status   GP     Jimmy Plessinger 08/08/2013, 11:05 AM  Jake Shark, PT DPT [phone]

## 2013-08-08 NOTE — Progress Notes (Signed)
SPORTS MEDICINE AND JOINT REPLACEMENT  Georgena Spurling, MD   Altamese Cabal, PA-C [address] [place], [place], [place]  [address]                             [phone]   PROGRESS NOTE  Subjective:  negative for Chest Pain  negative for Shortness of Breath  negative for Nausea/Vomiting   negative for Calf Pain  negative for Bowel Movement   Tolerating Diet: yes         Patient reports pain as 8 on 0-10 scale.    Objective: Vital signs in last 24 hours:   Patient Vitals for the past 24 hrs:  BP Temp Temp src Pulse Resp SpO2  08/08/13 0747 - - - - - 97 %  08/08/13 0726 130/65 mmHg 98.9 F (37.2 C) - 98 18 99 %  08/08/13 0227 129/67 mmHg 99.4 F (37.4 C) - 104 18 99 %  08/07/13 2209 182/80 mmHg 98.6 F (37 C) - 88 18 98 %  08/07/13 2200 175/83 mmHg - - 105 - -  08/07/13 2033 - - - - - 98 %  08/07/13 1903 198/85 mmHg 98.4 F (36.9 C) Oral 85 100 98 %  08/07/13 1549 183/79 mmHg - - - 18 100 %  08/07/13 1314 149/66 mmHg 97.9 F (36.6 C) Oral 80 18 100 %  08/07/13 1230 186/92 mmHg 98.5 F (36.9 C) - 78 15 100 %  08/07/13 1215 184/97 mmHg - - 80 16 100 %  08/07/13 1200 202/105 mmHg - - - 19 100 %  08/07/13 1145 190/98 mmHg - - 82 15 99 %  08/07/13 1130 196/96 mmHg - - 85 13 100 %  08/07/13 1115 181/97 mmHg - - 84 13 99 %  08/07/13 1100 179/91 mmHg - - 88 14 99 %  08/07/13 1049 169/117 mmHg 97.4 F (36.3 C) - 89 17 98 %    @flow {1959:LAST@   Intake/Output from previous day:   09/29 0701 - 09/30 0700 In: 1831.3 [I.V.:1831.3] Out: 1800 [Urine:1450; Drains:300]   Intake/Output this shift:   09/30 0701 - 09/30 1900 In: -  Out: 1050 [Urine:1000; Drains:50]   Intake/Output     09/29 0701 - 09/30 0700 09/30 0701 - 10/01 0700   I.V. 1831.3    Total Intake 1831.3     Urine 1450 1000   Drains 300 50   Blood 50    Total Output 1800 1050   Net +31.3 -1050           LABORATORY DATA:  Recent Labs  08/07/13 1330 08/08/13 0605  WBC 12.2* 12.6*  HGB 11.0* 10.5*   HCT 30.8* 29.5*  PLT 291 285    Recent Labs  08/07/13 1330 08/08/13 0605  NA  --  123*  K  --  3.1*  CL  --  88*  CO2  --  23  BUN  --  7  CREATININE 0.53 0.58  GLUCOSE  --  180*  CALCIUM  --  8.3*   Lab Results  Component Value Date   INR 0.96 07/28/2013    Examination:  General appearance: alert, cooperative and no distress Extremities: Homans sign is negative, no sign of DVT  Wound Exam: clean, dry, intact   Drainage:  Scant/small amount Serosanguinous exudate  Motor Exam: EHL and FHL Intact  Sensory Exam: Deep Peroneal normal   Assessment:    1 Day Post-Op  Procedure(s) (  LRB): TOTAL KNEE ARTHROPLASTY- LEFT (Left)  ADDITIONAL DIAGNOSIS:  Active Problems:   * No active hospital problems. *  Acute Blood Loss Anemia   Plan: Physical Therapy as ordered Weight Bearing as Tolerated (WBAT)  DVT Prophylaxis:  Lovenox  DISCHARGE PLAN: Skilled Nursing Facility/Rehab  DISCHARGE NEEDS: HHPT, CPM, Walker and 3-in-1 comode seat  Medicine following for BP control         Hanne Kegg 08/08/2013, 9:07 AM

## 2013-08-08 NOTE — H&P (Signed)
[facility] History and Physical  [name] [name] ZOX:[id number] DOB: Jul 25, 1930    PCP:   Oneal Grout, MD   Consult requested by Dr [name] or ortho.  Reason for the consult:  Manage HTN.  HPI: [name] [name] is an 79 y.o. female with hx of TKA today, hx of HTN generally well controlled, DM, found to have HTN with SBP 200's.  She was in some pain, and had missed her routine medication today.  She was restarted on her meds, and was given IV Hydralazine.  Hospitalist was asked to manage her BP.  She was also noted to have Na of 129 on 07/28/13.  She denied CP, HA, or SOB.    Rewiew of Systems:  Constitutional: Negative for malaise, fever and chills. No significant weight loss or weight gain Eyes: Negative for eye pain, redness and discharge, diplopia, visual changes, or flashes of light. ENMT: Negative for ear pain, hoarseness, nasal congestion, sinus pressure and sore throat. No headaches; tinnitus, drooling, or problem swallowing. Cardiovascular: Negative for chest pain, palpitations, diaphoresis, dyspnea and peripheral edema. ; No orthopnea, PND Respiratory: Negative for cough, hemoptysis, wheezing and stridor. No pleuritic chestpain. Gastrointestinal: Negative for nausea, vomiting, diarrhea, constipation, abdominal pain, melena, blood in stool, hematemesis, jaundice and rectal bleeding.    Genitourinary: Negative for frequency, dysuria, incontinence,flank pain and hematuria; Musculoskeletal: Negative for back pain and neck pain. Negative for swelling and trauma.;  Skin: . Negative for pruritus, rash, abrasions, bruising and skin lesion.; ulcerations Neuro: Negative for headache, lightheadedness and neck stiffness. Negative for weakness, altered level of consciousness , altered mental status, extremity weakness, burning feet, involuntary movement, seizure and syncope.  Psych: negative for anxiety, depression, insomnia, tearfulness, panic attacks, hallucinations, paranoia,  suicidal or homicidal ideation    Past Medical History  Diagnosis Date  . Hypertension   . First degree atrioventricular block   . Onychia and paronychia of toe   . Muscle weakness (generalized)   . Allergic rhinitis due to pollen   . Gout, unspecified   . Disorder of bone and cartilage, unspecified   . Edema   . Hyperlipidemia   . Unspecified essential hypertension   . Coronary atherosclerosis of native coronary artery   . Other malaise and fatigue   . Cough   . Other fall   . Unspecified vitamin D deficiency   . TIA (transient ischemic attack)     "a few one summer" (08/07/2013)  . Myocardial infarction ~ 1970  . Exertional shortness of breath     "sometimes" (08/07/2013)  . Type II diabetes mellitus     "fasting 90-110s" (08/07/2013)  . Osteoarthrosis, unspecified whether generalized or localized, unspecified site   . Prepatellar bursitis   . Arthritis     "in q joint" (08/07/2013)    Past Surgical History  Procedure Laterality Date  . Tonsillectomy  08/1969  . Replacement total knee Right 09/2005  . Shoulder open rotator cuff repair Right 07/1999  . Appendectomy  08/1948  . Abdominal hysterectomy  04/1979  . Transthoracic echocardiogram  2003    EF 55-65%; mild concentric LVH  . Joint replacement    . Total knee arthroplasty Left 08/07/2013  . Cataract extraction w/ intraocular lens  implant, bilateral Bilateral ~ 2012    Medications:  HOME MEDS: Prior to Admission medications   Medication Sig Start Date End Date Taking? Authorizing Provider  acetaminophen (TYLENOL) 500 MG tablet Take 500 mg by mouth 2 (two) times daily as needed for pain (  for breakthru pain (takes between vicodin doses)).   Yes Historical Provider, MD  albuterol (PROVENTIL HFA;VENTOLIN HFA) 108 (90 BASE) MCG/ACT inhaler Inhale 2 puffs into the lungs every 6 (six) hours as needed for wheezing. 04/19/13  Yes Oneal Grout, MD  allopurinol (ZYLOPRIM) 100 MG tablet Take 100 mg by mouth daily. For gout    Yes Historical Provider, MD  amoxicillin-clavulanate (AUGMENTIN) 875-125 MG per tablet Take 1 tablet by mouth 2 (two) times daily. 07/24/13  Yes Claudie Revering, NP  aspirin EC 81 MG tablet Take 81 mg by mouth daily.   Yes Historical Provider, MD  Calcium-Magnesium-Vitamin D (CALCIUM 500 PO) Take 1 tablet by mouth daily. Take one tablet once daily   Yes Historical Provider, MD  Cholecalciferol (VITAMIN D PO) Take 1 tablet by mouth daily.    Yes Historical Provider, MD  cloNIDine (CATAPRES) 0.1 MG tablet Take 1 tablet (0.1 mg total) by mouth daily. 07/11/13  Yes Mahima Glade Lloyd, MD  diclofenac sodium (VOLTAREN) 1 % GEL Apply 2 g topically 2 (two) times daily as needed (for pain).   Yes Historical Provider, MD  Fluticasone-Salmeterol (ADVAIR DISKUS) 250-50 MCG/DOSE AEPB Inhale 1 puff into the lungs 2 (two) times daily. 05/03/13  Yes Mahima Pandey, MD  furosemide (LASIX) 20 MG tablet Take 20 mg by mouth daily. 1 by mouth daily 05/15/13  Yes Claudie Revering, NP  hydrALAZINE (APRESOLINE) 25 MG tablet Take 25 mg by mouth 2 (two) times daily.   Yes Historical Provider, MD  HYDROcodone-acetaminophen (NORCO/VICODIN) 5-325 MG per tablet Take 1 tablet by mouth 2 (two) times daily as needed for pain.   Yes Historical Provider, MD  isosorbide mononitrate (IMDUR) 30 MG 24 hr tablet Take 45 mg by mouth daily.   Yes Historical Provider, MD  metFORMIN (GLUCOPHAGE) 500 MG tablet Take 500 mg by mouth daily with breakfast. For diabetes   Yes Historical Provider, MD  metoprolol succinate (TOPROL-XL) 100 MG 24 hr tablet Take 100 mg by mouth daily. Take with or immediately following a meal.   Yes Historical Provider, MD  polyvinyl alcohol (LIQUIFILM TEARS) 1.4 % ophthalmic solution Place 1 drop into both eyes as needed (for dry eyes).   Yes Historical Provider, MD  predniSONE (DELTASONE) 5 MG tablet Take 5 mg by mouth 2 (two) times daily.  06/14/13  Yes Historical Provider, MD  triamcinolone cream (KENALOG) 0.1 % Apply 1 application  topically 2 (two) times daily as needed (to affected area).   Yes Historical Provider, MD  valsartan-hydrochlorothiazide (DIOVAN-HCT) 160-25 MG per tablet Take 1 tablet by mouth daily. For blood pressure   Yes Historical Provider, MD  verapamil (VERELAN PM) 240 MG 24 hr capsule Take 240 mg by mouth daily.   Yes Historical Provider, MD  AMBULATORY NON FORMULARY MEDICATION Regular Mattress for Hospital Bed 08/03/13   Claudie Revering, NP  AMBULATORY NON FORMULARY MEDICATION Gel Over-lay for hospital bed. 08/04/13   Kermit Balo, DO     Allergies:  Allergies  Allergen Reactions  . Cetirizine Hcl Other (See Comments)    unknown  . Codeine Other (See Comments)    unknown  . Fish Allergy   . Indomethacin Other (See Comments)    unknown  . Lyrica [Pregabalin]     hallucinations  . Methyldopa Other (See Comments)    unknown  . Shellfish Allergy Hives  . Tomato Rash    Social History:   reports that she has never smoked. She has never used smokeless tobacco.  She reports that she does not drink alcohol or use illicit drugs.  Family History: Family History  Problem Relation Age of Onset  . Heart attack Father      Physical Exam: Filed Vitals:   08/07/13 2033 08/07/13 2200 08/07/13 2209 08/08/13 0227  BP:  175/83 182/80 129/67  Pulse:  105 88 104  Temp:   98.6 F (37 C) 99.4 F (37.4 C)  TempSrc:      Resp:   18 18  SpO2: 98%  98% 99%   Blood pressure 129/67, pulse 104, temperature 99.4 F (37.4 C), temperature source Oral, resp. rate 18, SpO2 99.00%.  GEN:  Pleasant  patient lying in the stretcher in no acute distress; cooperative with exam. PSYCH:  alert and oriented x4; does not appear anxious or depressed; affect is appropriate. HEENT: Mucous membranes pink and anicteric; PERRLA; EOM intact; no cervical lymphadenopathy nor thyromegaly or carotid bruit; no JVD; There were no stridor. Neck is very supple. Breasts:: Not examined CHEST WALL: No tenderness CHEST: Normal  respiration, clear to auscultation bilaterally.  HEART: Regular rate and rhythm.  There are no murmur, rub, or gallops.   BACK: No kyphosis or scoliosis; no CVA tenderness ABDOMEN: soft and non-tender; no masses, no organomegaly, normal abdominal bowel sounds; no pannus; no intertriginous candida. There is no rebound and no distention. Rectal Exam: Not done EXTREMITIES:  Her knee is wrapped. There is no calf tenderness. Genitalia: not examined PULSES: 2+ and symmetric SKIN: Normal hydration no rash or ulceration CNS: Cranial nerves 2-12 grossly intact no focal lateralizing neurologic deficit.  Speech is fluent; uvula elevated with phonation, facial symmetry and tongue midline. DTR are normal bilaterally, cerebella exam is intact, barbinski is negative and strengths are equaled bilaterally.  No sensory loss.   Labs on Admission:  Basic Metabolic Panel:  Recent Labs Lab 08/07/13 1330  CREATININE 0.53   Liver Function Tests: No results found for this basename: AST, ALT, ALKPHOS, BILITOT, PROT, ALBUMIN,  in the last 168 hours No results found for this basename: LIPASE, AMYLASE,  in the last 168 hours No results found for this basename: AMMONIA,  in the last 168 hours CBC:  Recent Labs Lab 08/07/13 1330  WBC 12.2*  HGB 11.0*  HCT 30.8*  MCV 88.3  PLT 291   Cardiac Enzymes: No results found for this basename: CKTOTAL, CKMB, CKMBINDEX, TROPONINI,  in the last 168 hours  CBG:  Recent Labs Lab 08/07/13 0755 08/07/13 1050 08/07/13 1330 08/07/13 1601 08/07/13 2226  GLUCAP 153* 162* 176* 191* 195*     Radiological Exams on Admission: No results found.  Assessment/Plan  S/P TKA HTN Hyponatremia DM Obesity  PLAN:  I suspect her BP will normalize once she is back to her HTN regimen.  In the interim, will give IV Labetelol PRN.  Her pain is now better controlled as well, and this will help with her BP.  She had low Na, and is currently on NS, so will follow and repeat her  BMET.  Thank you for asking [place] to manage her BP.  We will follow along with you.    Other plans as per orders.  Code Status: FULL Unk Lightning, MD. [facility] Pager [phone] 7pm to 7am.  08/08/2013, 5:06 AM

## 2013-08-08 NOTE — Consult Note (Signed)
[facility] Medical Consultation  KASSI ESTEVE ZDG:[id number] DOB: 05/05/1930 DOA: 08/07/2013 PCP: Oneal Grout, MD   Requesting physician:  Date of consultation: 08/08/2013 Reason for consultation: HTN  Impression/Recommendations Active Problems:   DIABETES MELLITUS, TYPE II   HTN (hypertension)   Hyponatremia   79 y.o. female with hx of TKA today, hx of HTN generally well controlled, DM, found to have HTN with SBP 200's. She was in some pain, and had missed her routine medication today. She was restarted on her meds, and was given IV Hydralazine. Hospitalist was asked to manage her BP. She was also noted to have Na of 129 on 07/28/13. She denied CP, HA, or SOB.   1. HTN stable;  -cont BB, clonidine, BB, imdur, hydralazine, verapamyl; hold hctz+lasix  2. Hypo Na ? Diuretic related (lasix+hctz);  -d/c diuretics; patient is euvolemic; takes lasix prn only at home  -cont gentle IV NS+KCL for 1 liter; recheck Na in AM   3. S/p TKA per ortho   4. DM last HA1c-7.1; cont ISS+metformin   I will followup again tomorrow. Please contact me if I can be of assistance in the meanwhile. Thank you for this consultation.  Chief Complaint: HTN   HPI:  alert  Review of Systems:  Review of Systems  Constitutional: Negative for fever, chills and diaphoresis.  HENT: Negative for nosebleeds and congestion.   Eyes: Negative for blurred vision, photophobia and discharge.  Respiratory: Negative for cough and hemoptysis.   Cardiovascular: Negative for chest pain, orthopnea and PND.  Gastrointestinal: Negative for nausea, vomiting and constipation.  Genitourinary: Negative for dysuria, urgency and hematuria.  Musculoskeletal: Negative for myalgias.  Neurological: Negative for dizziness, tingling, seizures, weakness and headaches.  Psychiatric/Behavioral: Negative for depression and hallucinations. The patient does not have insomnia.      Past Medical History  Diagnosis Date  .  Hypertension   . First degree atrioventricular block   . Onychia and paronychia of toe   . Muscle weakness (generalized)   . Allergic rhinitis due to pollen   . Gout, unspecified   . Disorder of bone and cartilage, unspecified   . Edema   . Hyperlipidemia   . Unspecified essential hypertension   . Coronary atherosclerosis of native coronary artery   . Other malaise and fatigue   . Cough   . Other fall   . Unspecified vitamin D deficiency   . TIA (transient ischemic attack)     "a few one summer" (08/07/2013)  . Myocardial infarction ~ 1970  . Exertional shortness of breath     "sometimes" (08/07/2013)  . Type II diabetes mellitus     "fasting 90-110s" (08/07/2013)  . Osteoarthrosis, unspecified whether generalized or localized, unspecified site   . Prepatellar bursitis   . Arthritis     "in q joint" (08/07/2013)   Past Surgical History  Procedure Laterality Date  . Tonsillectomy  08/1969  . Replacement total knee Right 09/2005  . Shoulder open rotator cuff repair Right 07/1999  . Appendectomy  08/1948  . Abdominal hysterectomy  04/1979  . Transthoracic echocardiogram  2003    EF 55-65%; mild concentric LVH  . Joint replacement    . Total knee arthroplasty Left 08/07/2013  . Cataract extraction w/ intraocular lens  implant, bilateral Bilateral ~ 2012  . Total knee arthroplasty Left 08/07/2013    Procedure: TOTAL KNEE ARTHROPLASTY- LEFT;  Surgeon: Dannielle Huh, MD;  Location: [facility] OR;  Service: Orthopedics;  Laterality: Left;   Social History:  reports that she has never smoked. She has never used smokeless tobacco. She reports that she does not drink alcohol or use illicit drugs.  Allergies  Allergen Reactions  . Cetirizine Hcl Other (See Comments)    unknown  . Codeine Other (See Comments)    unknown  . Fish Allergy   . Indomethacin Other (See Comments)    unknown  . Lyrica [Pregabalin]     hallucinations  . Methyldopa Other (See Comments)    unknown  . Shellfish Allergy  Hives  . Tomato Rash   Family History  Problem Relation Age of Onset  . Heart attack Father     Prior to Admission medications   Medication Sig Start Date End Date Taking? Authorizing Provider  acetaminophen (TYLENOL) 500 MG tablet Take 500 mg by mouth 2 (two) times daily as needed for pain (for breakthru pain (takes between vicodin doses)).   Yes Historical Provider, MD  albuterol (PROVENTIL HFA;VENTOLIN HFA) 108 (90 BASE) MCG/ACT inhaler Inhale 2 puffs into the lungs every 6 (six) hours as needed for wheezing. 04/19/13  Yes Oneal Grout, MD  allopurinol (ZYLOPRIM) 100 MG tablet Take 100 mg by mouth daily. For gout   Yes Historical Provider, MD  amoxicillin-clavulanate (AUGMENTIN) 875-125 MG per tablet Take 1 tablet by mouth 2 (two) times daily. 07/24/13  Yes Claudie Revering, NP  aspirin EC 81 MG tablet Take 81 mg by mouth daily.   Yes Historical Provider, MD  Calcium-Magnesium-Vitamin D (CALCIUM 500 PO) Take 1 tablet by mouth daily. Take one tablet once daily   Yes Historical Provider, MD  Cholecalciferol (VITAMIN D PO) Take 1 tablet by mouth daily.    Yes Historical Provider, MD  cloNIDine (CATAPRES) 0.1 MG tablet Take 1 tablet (0.1 mg total) by mouth daily. 07/11/13  Yes Mahima Glade Lloyd, MD  diclofenac sodium (VOLTAREN) 1 % GEL Apply 2 g topically 2 (two) times daily as needed (for pain).   Yes Historical Provider, MD  Fluticasone-Salmeterol (ADVAIR DISKUS) 250-50 MCG/DOSE AEPB Inhale 1 puff into the lungs 2 (two) times daily. 05/03/13  Yes Mahima Pandey, MD  furosemide (LASIX) 20 MG tablet Take 20 mg by mouth daily. 1 by mouth daily 05/15/13  Yes Claudie Revering, NP  hydrALAZINE (APRESOLINE) 25 MG tablet Take 25 mg by mouth 2 (two) times daily.   Yes Historical Provider, MD  HYDROcodone-acetaminophen (NORCO/VICODIN) 5-325 MG per tablet Take 1 tablet by mouth 2 (two) times daily as needed for pain.   Yes Historical Provider, MD  isosorbide mononitrate (IMDUR) 30 MG 24 hr tablet Take 45 mg by mouth  daily.   Yes Historical Provider, MD  metFORMIN (GLUCOPHAGE) 500 MG tablet Take 500 mg by mouth daily with breakfast. For diabetes   Yes Historical Provider, MD  metoprolol succinate (TOPROL-XL) 100 MG 24 hr tablet Take 100 mg by mouth daily. Take with or immediately following a meal.   Yes Historical Provider, MD  polyvinyl alcohol (LIQUIFILM TEARS) 1.4 % ophthalmic solution Place 1 drop into both eyes as needed (for dry eyes).   Yes Historical Provider, MD  predniSONE (DELTASONE) 5 MG tablet Take 5 mg by mouth 2 (two) times daily.  06/14/13  Yes Historical Provider, MD  triamcinolone cream (KENALOG) 0.1 % Apply 1 application topically 2 (two) times daily as needed (to affected area).   Yes Historical Provider, MD  valsartan-hydrochlorothiazide (DIOVAN-HCT) 160-25 MG per tablet Take 1 tablet by mouth daily. For blood pressure   Yes Historical Provider, MD  verapamil (  VERELAN PM) 240 MG 24 hr capsule Take 240 mg by mouth daily.   Yes Historical Provider, MD  AMBULATORY NON FORMULARY MEDICATION Regular Mattress for Hospital Bed 08/03/13   Claudie Revering, NP  AMBULATORY NON FORMULARY MEDICATION Gel Over-lay for hospital bed. 08/04/13   Kermit Balo, DO   Physical Exam: Blood pressure 142/79, pulse 100, temperature 98.9 F (37.2 C), temperature source Oral, resp. rate 18, SpO2 97.00%. Filed Vitals:   08/08/13 1146  BP:   Pulse: 100  Temp:   Resp:      General:  Alert   Eyes: EOM-i  ENT: no oral ulcers   Neck: supple, no JVD   Cardiovascular: s1,s2 rrr  Respiratory: cta BL   Abdomen: soft, nd, nt   Skin: no rash   Musculoskeletal: no le edema   Psychiatric: no hallucinations   Neurologic: CN 2-12 intact   Labs on Admission:  Basic Metabolic Panel:  Recent Labs Lab 08/07/13 1330 08/08/13 0605  NA  --  123*  K  --  3.1*  CL  --  88*  CO2  --  23  GLUCOSE  --  180*  BUN  --  7  CREATININE 0.53 0.58  CALCIUM  --  8.3*   Liver Function Tests: No results found for  this basename: AST, ALT, ALKPHOS, BILITOT, PROT, ALBUMIN,  in the last 168 hours No results found for this basename: LIPASE, AMYLASE,  in the last 168 hours No results found for this basename: AMMONIA,  in the last 168 hours CBC:  Recent Labs Lab 08/07/13 1330 08/08/13 0605  WBC 12.2* 12.6*  HGB 11.0* 10.5*  HCT 30.8* 29.5*  MCV 88.3 87.5  PLT 291 285   Cardiac Enzymes: No results found for this basename: CKTOTAL, CKMB, CKMBINDEX, TROPONINI,  in the last 168 hours BNP: No components found with this basename: POCBNP,  CBG:  Recent Labs Lab 08/07/13 1330 08/07/13 1601 08/07/13 2226 08/08/13 0640 08/08/13 1126  GLUCAP 176* 191* 195* 183* 191*    Radiological Exams on Admission: No results found.  EKG: Independently reviewed. Not done   Time spent: > 35 minutes   Esperanza Sheets [facility] Pager [phone]  If 7PM-7AM, please contact night-coverage [email or web address] Password [email or web address] 08/08/2013, 1:16 PM

## 2013-08-08 NOTE — Progress Notes (Signed)
Patient unable to void for 8 hours after foley catheter removed.  Patient bladder scanned for 150 mL and states she feels the urge to void but is unable; patient complains of discomfort.  Patient in and out cathed for 150 mL.  Will continue to monitor.

## 2013-08-08 NOTE — Op Note (Signed)
TOTAL KNEE REPLACEMENT OPERATIVE NOTE:  08/07/2013  2:27 PM  PATIENT:  [name] [name]  79 y.o. female  PRE-OPERATIVE DIAGNOSIS:  osteoarthritis left knee  POST-OPERATIVE DIAGNOSIS:  osteoarthritis left knee  PROCEDURE:  Procedure(s): TOTAL KNEE ARTHROPLASTY- LEFT  SURGEON:  Surgeon(s): Dannielle Huh, MD  PHYSICIAN ASSISTANT: Altamese Cabal, [facility]  ANESTHESIA:   general  DRAINS: Hemovac and On-Q Marcaine Pain Pump  SPECIMEN: None  COUNTS:  Correct  TOURNIQUET:   Total Tourniquet Time Documented: Thigh (Left) - 52 minutes Total: Thigh (Left) - 52 minutes   DICTATION:  Indication for procedure:    The patient is a 79 y.o. female who has failed conservative treatment for osteoarthritis left knee.  Informed consent was obtained prior to anesthesia. The risks versus benefits of the operation were explain and in a way the patient can, and did, understand.   Description of procedure:     The patient was taken to the operating room and placed under anesthesia.  The patient was positioned in the usual fashion taking care that all body parts were adequately padded and/or protected.  I foley catheter was placed.  A tourniquet was applied and the leg prepped and draped in the usual sterile fashion.  The extremity was exsanguinated with the esmarch and tourniquet inflated to 350 mmHg.  Pre-operative range of motion was normal.  The knee was in 5 degree of mild varus.  A midline incision approximately 6-7 inches long was made with a #10 blade.  A new blade was used to make a parapatellar arthrotomy going 2-3 cm into the quadriceps tendon, over the patella, and alongside the medial aspect of the patellar tendon.  A synovectomy was then performed with the #10 blade and forceps. I then elevated the deep MCL off the medial tibial metaphysis subperiosteally around to the semimembranosus attachment.    I everted the patella and used calipers to measure patellar thickness.  I used the reamer to  ream down to appropriate thickness to recreate the native thickness.  I then removed excess bone with the rongeur and sagittal saw.  I used the appropriately sized template and drilled the three lug holes.  I then put the trial in place and measured the thickness with the calipers to ensure recreation of the native thickness.  The trial was then removed and the patella subluxed and the knee brought into flexion.  A homan retractor was place to retract and protect the patella and lateral structures.  A Z-retractor was place medially to protect the medial structures.  The extra-medullary alignment system was used to make cut the tibial articular surface perpendicular to the anamotic axis of the tibia and in 3 degrees of posterior slope.  The cut surface and alignment jig was removed.  I then used the intramedullary alignment guide to make a 6 valgus cut on the distal femur.  I then marked out the epicondylar axis on the distal femur.  The posterior condylar axis measured 3 degrees.  I then used the anterior referencing sizer and measured the femur to be a size 5.  The 4-In-1 cutting block was screwed into place in external rotation matching the posterior condylar angle, making our cuts perpendicular to the epicondylar axis.  Anterior, posterior and chamfer cuts were made with the sagittal saw.  The cutting block and cut pieces were removed.  A lamina spreader was placed in 90 degrees of flexion.  The ACL, PCL, menisci, and posterior condylar osteophytes were removed.  A 12 mm spacer  blocked was found to offer good flexion and extension gap balance after minimal in degree releasing.   The scoop retractor was then placed and the femoral finishing block was pinned in place.  The small sagittal saw was used as well as the lug drill to finish the femur.  The block and cut surfaces were removed and the medullary canal hole filled with autograft bone from the cut pieces.  The tibia was delivered forward in deep  flexion and external rotation.  A size D tray was selected and pinned into place centered on the medial 1/3 of the tibial tubercle.  The reamer and keel was used to prepare the tibia through the tray.    I then trialed with the size 5 femur, size D tibia, a 12 mm insert and the 32 patella.  I had excellent flexion/extension gap balance, excellent patella tracking.  Flexion was full and beyond 120 degrees; extension was zero.  These components were chosen and the staff opened them to me on the back table while the knee was lavaged copiously and the cement mixed.  The soft tissue was infiltrated with 60cc of exparel 1.3% through a 21 gauge needle.  I cemented in the components and removed all excess cement.  The polyethylene tibial component was snapped into place and the knee placed in extension while cement was hardening.  The capsule was infilltrated with 30cc of .25% Marcaine with epinephrine.  A hemovac was place in the joint exiting superolaterally.  A pain pump was place superomedially superficial to the arthrotomy.  Once the cement was hard, the tourniquet was let down.  Hemostasis was obtained.  The arthrotomy was closed with figure-8 #1 vicryl sutures.  The deep soft tissues were closed with #0 vicryls and the subcuticular layer closed with a running #2-0 vicryl.  The skin was reapproximated and closed with skin staples.  The wound was dressed with xeroform, 4 x4's, 2 ABD sponges, a single layer of webril and a TED stocking.   The patient was then awakened, extubated, and taken to the recovery room in stable condition.  BLOOD LOSS:  300cc DRAINS: 1 hemovac, 1 pain catheter COMPLICATIONS:  None.  PLAN OF CARE: Admit to inpatient   PATIENT DISPOSITION:  PACU - hemodynamically stable.   Delay start of Pharmacological VTE agent (>24hrs) due to surgical blood loss or risk of bleeding:  not applicable  Please fax a copy of this op note to my office at [phone] (please only include page 1 and  2 of the Case Information op note)

## 2013-08-08 NOTE — Progress Notes (Signed)
Physical Therapy Treatment Patient Details Name: [name] [name] MRN: [id number] DOB: 1930-03-09 Today's Date: 08/08/2013 Time: 4098-1191 PT Time Calculation (min): 38 min  PT Assessment / Plan / Recommendation  History of Present Illness s/p L TKA   PT Comments   Pt with continued fatigue limiting overall mobility.  Pt unable to ambulate this session and needed max manual and tactile cues to stand upright to slide recliner out to sit pt on bedside commode.  Pt was unable to advance bilateral LE to perform stand pivot.   Follow Up Recommendations  SNF;Supervision/Assistance - 24 hour     Equipment Recommendations  Rolling walker with 5" wheels;3in1 (PT)    Frequency 7X/week   Progress towards PT Goals Progress towards PT goals: Not progressing toward goals - comment (Due to fatigue)  Plan Discharge plan needs to be updated    Precautions / Restrictions Precautions Precautions: Knee;Fall Precaution Comments: Watch for dizziness--mildly when sat EOB, BP stable Restrictions Weight Bearing Restrictions: Yes LLE Weight Bearing: Weight bearing as tolerated   Pertinent Vitals/Pain C/o 7/10 left knee pain; premedicated    Mobility  Bed Mobility Bed Mobility: Sit to Supine Sit to Supine: 1: +2 Total assist Sit to Supine: Patient Percentage: 50% Details for Bed Mobility Assistance: (A) to slowly descend trunk into bed and elevate LE into bed.  Transfers Transfers: Sit to Stand;Stand to Sit Sit to Stand: 1: +2 Total assist;With upper extremity assist;From elevated surface;From chair/3-in-1;From bed Sit to Stand: Patient Percentage: 50% Stand to Sit: 1: +2 Total assist;With upper extremity assist;With armrests;To chair/3-in-1 Stand to Sit: Patient Percentage: 50% Details for Transfer Assistance: Decreased control for descent and VCs for hand placement.  Pt with several sit <> stand from recliner to 3n1 to bed.  Pt with increase (A) needed due to fatigue.   Ambulation/Gait Ambulation/Gait Assistance: Not tested (comment) (Pt unable to advance right LE) Stairs: No    Exercises General Exercises - Lower Extremity Gluteal Sets: Strengthening;10 reps;Seated   PT Diagnosis:    PT Problem List:   PT Treatment Interventions:     PT Goals (current goals can now be found in the care plan section) Acute Rehab PT Goals Patient Stated Goal: get back to doing everything PT Goal Formulation: With patient Time For Goal Achievement: 08/14/13 Potential to Achieve Goals: Good  Visit Information  Last PT Received On: 08/08/13 Assistance Needed: +2 PT/OT Co-Evaluation/Treatment: Yes History of Present Illness: s/p L TKA    Subjective Data  Subjective: I'm just worn out. Patient Stated Goal: get back to doing everything   Cognition  Cognition Arousal/Alertness: Awake/alert Behavior During Therapy: WFL for tasks assessed/performed Overall Cognitive Status: Within Functional Limits for tasks assessed    Balance  Balance Balance Assessed: Yes Static Sitting Balance Static Sitting - Balance Support: Right upper extremity supported;Left upper extremity supported;Feet supported Static Sitting - Level of Assistance: 5: Stand by assistance Static Standing Balance Static Standing - Balance Support: Bilateral upper extremity supported Static Standing - Level of Assistance: 1: +2 Total assist;Patient percentage (comment) (60%) Static Standing - Comment/# of Minutes: Pt with increase forward lean during standing due to fatigue.   End of Session PT - End of Session Equipment Utilized During Treatment: Gait belt Activity Tolerance: Patient limited by pain;Patient limited by fatigue Patient left: with call bell/phone within reach;in bed Nurse Communication: Mobility status   GP     Marquita Lias 08/08/2013, 3:47 PM  Jake Shark, PT DPT [phone]

## 2013-08-09 DIAGNOSIS — D62 Acute posthemorrhagic anemia: Secondary | ICD-10-CM | POA: Diagnosis not present

## 2013-08-09 DIAGNOSIS — E871 Hypo-osmolality and hyponatremia: Secondary | ICD-10-CM

## 2013-08-09 LAB — GLUCOSE, CAPILLARY
Glucose-Capillary: 145 mg/dL — ABNORMAL HIGH (ref 70–99)
Glucose-Capillary: 159 mg/dL — ABNORMAL HIGH (ref 70–99)
Glucose-Capillary: 176 mg/dL — ABNORMAL HIGH (ref 70–99)
Glucose-Capillary: 158 mg/dL — ABNORMAL HIGH (ref 70–99)

## 2013-08-09 LAB — CBC
HCT: 23.7 % — ABNORMAL LOW (ref 36.0–46.0)
Hemoglobin: 8.5 g/dL — ABNORMAL LOW (ref 12.0–15.0)
MCH: 31.3 pg (ref 26.0–34.0)
MCHC: 35.9 g/dL (ref 30.0–36.0)
MCV: 87.1 fL (ref 78.0–100.0)
Platelets: 264 K/uL (ref 150–400)
RBC: 2.72 MIL/uL — ABNORMAL LOW (ref 3.87–5.11)
RDW: 15.8 % — ABNORMAL HIGH (ref 11.5–15.5)
WBC: 14.3 K/uL — ABNORMAL HIGH (ref 4.0–10.5)

## 2013-08-09 LAB — BASIC METABOLIC PANEL WITH GFR
BUN: 12 mg/dL (ref 6–23)
CO2: 22 meq/L (ref 19–32)
Calcium: 8 mg/dL — ABNORMAL LOW (ref 8.4–10.5)
Chloride: 92 meq/L — ABNORMAL LOW (ref 96–112)
Creatinine, Ser: 0.82 mg/dL (ref 0.50–1.10)
GFR calc Af Amer: 76 mL/min — ABNORMAL LOW
GFR calc non Af Amer: 65 mL/min — ABNORMAL LOW
Glucose, Bld: 142 mg/dL — ABNORMAL HIGH (ref 70–99)
Potassium: 3.8 meq/L (ref 3.5–5.1)
Sodium: 125 meq/L — ABNORMAL LOW (ref 135–145)

## 2013-08-09 MED ORDER — FUROSEMIDE 20 MG PO TABS
20.0000 mg | ORAL_TABLET | Freq: Every day | ORAL | Status: DC
  Administered 2013-08-09 – 2013-08-10 (×2): 20 mg via ORAL
  Filled 2013-08-09 (×2): qty 1

## 2013-08-09 MED ORDER — OXYCODONE HCL 5 MG PO TABS: 5.0000 mg | ORAL_TABLET | ORAL | Status: DC | PRN

## 2013-08-09 MED ORDER — ENOXAPARIN SODIUM 40 MG/0.4ML ~~LOC~~ SOLN: 40.0000 mg | SUBCUTANEOUS | Status: DC

## 2013-08-09 MED ORDER — METHOCARBAMOL 500 MG PO TABS: 500.0000 mg | ORAL_TABLET | Freq: Four times a day (QID) | ORAL | Status: DC | PRN

## 2013-08-09 NOTE — Progress Notes (Signed)
SPORTS MEDICINE AND JOINT REPLACEMENT  Georgena Spurling, MD   Altamese Cabal, PA-C [address] [place], [place], [place]  [address]                             [phone]   PROGRESS NOTE  Subjective:  negative for Chest Pain  negative for Shortness of Breath  negative for Nausea/Vomiting   negative for Calf Pain  negative for Bowel Movement   Tolerating Diet: yes         Patient reports pain as 5 on 0-10 scale.    Objective: Vital signs in last 24 hours:   Patient Vitals for the past 24 hrs:  BP Temp Pulse Resp SpO2  08/09/13 0651 155/69 mmHg 100.2 F (37.9 C) 94 16 99 %  08/08/13 2147 114/53 mmHg 99.6 F (37.6 C) 78 16 97 %  08/08/13 2037 - - - - 97 %  08/08/13 1600 - - - 18 94 %  08/08/13 1400 120/62 mmHg 99.2 F (37.3 C) 108 20 92 %    @flow {1959:LAST@   Intake/Output from previous day:   09/30 0701 - 10/01 0700 In: 840 [P.O.:840] Out: 1200 [Urine:1150; Drains:50]   Intake/Output this shift:   10/01 0701 - 10/01 1900 In: -  Out: 550 [Urine:550]   Intake/Output     09/30 0701 - 10/01 0700 10/01 0701 - 10/02 0700   P.O. 840    I.V.     Total Intake 840     Urine 1150 550   Drains 50    Blood     Total Output 1200 550   Net -360 -550           LABORATORY DATA:  Recent Labs  08/07/13 1330 08/08/13 0605 08/09/13 0630  WBC 12.2* 12.6* 14.3*  HGB 11.0* 10.5* 8.5*  HCT 30.8* 29.5* 23.7*  PLT 291 285 264    Recent Labs  08/07/13 1330 08/08/13 0605 08/09/13 0630  NA  --  123* 125*  K  --  3.1* 3.8  CL  --  88* 92*  CO2  --  23 22  BUN  --  7 12  CREATININE 0.53 0.58 0.82  GLUCOSE  --  180* 142*  CALCIUM  --  8.3* 8.0*   Lab Results  Component Value Date   INR 0.96 07/28/2013    Examination:  General appearance: alert, cooperative and no distress Extremities: Homans sign is negative, no sign of DVT  Wound Exam: clean, dry, intact   Drainage:  None: wound tissue dry  Motor Exam: EHL and FHL Intact  Sensory Exam: Deep Peroneal  normal   Assessment:    2 Days Post-Op  Procedure(s) (LRB): TOTAL KNEE ARTHROPLASTY- LEFT (Left)  ADDITIONAL DIAGNOSIS:  Active Problems:   DIABETES MELLITUS, TYPE II   HTN (hypertension)   Hyponatremia  Acute Blood Loss Anemia   Plan: Physical Therapy as ordered Weight Bearing as Tolerated (WBAT)  DVT Prophylaxis:  Lovenox  DISCHARGE PLAN: Skilled Nursing Facility/Rehab  DISCHARGE NEEDS: HHPT, CPM, Walker and 3-in-1 comode seat         Raena Pau 08/09/2013, 1:32 PM

## 2013-08-09 NOTE — Progress Notes (Signed)
Physical Therapy Treatment Patient Details Name: [name] [name] MRN: [id number] DOB: 11-09-1930 Today's Date: 08/09/2013 Time: 4098-1191 PT Time Calculation (min): 25 min  PT Assessment / Plan / Recommendation  History of Present Illness s/p L TKA   PT Comments   Pt pleasant & willing to participate.  Progressing slowly with mobility.  Cont to strongly recommend SNF at d/c to maximize functional recovery & independence.     Follow Up Recommendations  SNF;Supervision/Assistance - 24 hour     Does the patient have the potential to tolerate intense rehabilitation     Barriers to Discharge        Equipment Recommendations  Rolling walker with 5" wheels;3in1 (PT)    Recommendations for Other Services    Frequency 7X/week   Progress towards PT Goals Progress towards PT goals: Progressing toward goals (slowly)  Plan Current plan remains appropriate    Precautions / Restrictions Precautions Precautions: Fall;Knee Restrictions LLE Weight Bearing: Weight bearing as tolerated   Pertinent Vitals/Pain 8/10 Lt knee.  RN notified.      Mobility  Bed Mobility Bed Mobility: Not assessed Transfers Transfers: Sit to Stand;Stand to Sit Sit to Stand: 1: +2 Total assist;With upper extremity assist;With armrests;From chair/3-in-1 Sit to Stand: Patient Percentage: 50% Stand to Sit: With upper extremity assist;With armrests;To chair/3-in-1;3: Mod assist Stand to Sit: Patient Percentage: 50% Details for Transfer Assistance: Cues to scoot hips closer to edge of chair to prepare for standing, hand placement, & technique.  (A) to bring hips forwards to edge of chair, (A) to power up to standing, for balance, facilitation for upright posture, & controlled descent.  Pt leans towards Rt Ambulation/Gait Ambulation/Gait Assistance: 1: +2 Total assist Ambulation/Gait: Patient Percentage: 60% Ambulation Distance (Feet): 5 Feet Assistive device: Rolling walker Ambulation/Gait Assistance Details: (A)  to advance RW, to lateral weight shift, to advance RLE.  Cues for sequencing & technique.  Pt with decreased weight shift towards Lt to allow sufficient Rt step.   Gait Pattern: Step-to pattern;Decreased step length - right;Decreased step length - left;Decreased stance time - left;Decreased hip/knee flexion - left;Decreased hip/knee flexion - right;Decreased weight shift to right;Antalgic Stairs: No    Exercises Total Joint Exercises Ankle Circles/Pumps: AROM;Both;10 reps Quad Sets: AROM;Both;10 reps;Strengthening Straight Leg Raises: AAROM;Strengthening;Left;10 reps Long Arc Quad: AAROM;Strengthening;Left;10 reps Knee Flexion: AAROM;Left;10 reps     PT Goals (current goals can now be found in the care plan section) Acute Rehab PT Goals Patient Stated Goal: get back to doing everything PT Goal Formulation: With patient Time For Goal Achievement: 08/14/13 Potential to Achieve Goals: Good  Visit Information  Last PT Received On: 08/09/13 Assistance Needed: +1 History of Present Illness: s/p L TKA    Subjective Data  Patient Stated Goal: get back to doing everything   Cognition  Cognition Arousal/Alertness: Awake/alert Behavior During Therapy: WFL for tasks assessed/performed Overall Cognitive Status: Within Functional Limits for tasks assessed    Balance     End of Session PT - End of Session Equipment Utilized During Treatment: Gait belt Activity Tolerance: Patient tolerated treatment well;Patient limited by pain Patient left: in chair;with call bell/phone within reach Nurse Communication: Mobility status;Patient requests pain meds   GP     [name] 08/09/2013, 8:31 AM   [name], PTA [phone] 08/09/2013

## 2013-08-09 NOTE — Progress Notes (Signed)
[facility] PROGRESS NOTE  [name] [name] ZOX:[id number] DOB: August 19, 1930 DOA: 08/07/2013 PCP: Oneal Grout, MD  HPI/Brief narrative 79 year old female with history of HTN, HL, CAD, type II DM, underwent left TKA on 08/08/13 for wash arthritis. Hospitalists were consulted for assistance in management of hypertension.  Assessment/Plan:  Hypertension - Probably elevated preoperatively secondary to pain and missing her routine medications. - Improved control. - Continue clonidine, hydralazine, irbesartan, metoprolol and verapamil. Looking at polypharmacy, probably a difficult to control hypertensive.  Hyponatremia - Possibly SIADH secondary to pain & HCTZ - Sodium 129 > 123 > 125 - HCTZ held. Resume Lasix and follow BMP - Check serum and urine osmolarity.   Postop Acute blood loss anemia  - EBL of that surgery 300 mL. - Follow CBCs and transfuse if hemoglobin <7 g per DL.   Type II DM - Reasonable inpatient control. - Continue SSI. Hold metformin inpatient.  Hypokalemia - Repleted.  History of CAD - Asymptomatic.  Status post left TKA on 08/08/13 - Management per orthopedics.  DVT prophylaxis: Lovenox  Lines/catheters: PIV  Nutrition: Diabetic  Activity:  Weight bearing as tolerated  Code Status: Full Family Communication: Discussed with daughter at bedside.  Disposition Plan: SNF when stable.     Procedures:  Left TKA on 9/30   Antibiotics:  None    Subjective: Mild left knee pain.  Objective: Filed Vitals:   08/08/13 2147 08/09/13 0651 08/09/13 1215 08/09/13 1406  BP: 114/53 155/69 145/73 139/73  Pulse: 78 94  96  Temp: 99.6 F (37.6 C) 100.2 F (37.9 C)  98.6 F (37 C)  TempSrc:      Resp: 16 16  18   SpO2: 97% 99%  96%    Intake/Output Summary (Last 24 hours) at 08/09/13 1558 Last data filed at 08/09/13 1408  Gross per 24 hour  Intake    840 ml  Output   1450 ml  Net   -610 ml   There were no vitals filed for this  visit.   Exam:  General exam: Moderately built and nourished female patient sitting comfortably on chair  Respiratory system: Clear. No increased work of breathing. Cardiovascular system: S1 & S2 heard, RRR. No JVD, murmurs, gallops, clicks or pedal edema. Gastrointestinal system: Abdomen is nondistended, soft and nontender. Normal bowel sounds heard. Central nervous system: Alert and oriented. No focal neurological deficits. Extremities: Symmetric 5 x 5 power.Left knee dressing site, clean and dry.    Data Reviewed: Basic Metabolic Panel:  Recent Labs Lab 08/07/13 1330 08/08/13 0605 08/09/13 0630  NA  --  123* 125*  K  --  3.1* 3.8  CL  --  88* 92*  CO2  --  23 22  GLUCOSE  --  180* 142*  BUN  --  7 12  CREATININE 0.53 0.58 0.82  CALCIUM  --  8.3* 8.0*   Liver Function Tests: No results found for this basename: AST, ALT, ALKPHOS, BILITOT, PROT, ALBUMIN,  in the last 168 hours No results found for this basename: LIPASE, AMYLASE,  in the last 168 hours No results found for this basename: AMMONIA,  in the last 168 hours CBC:  Recent Labs Lab 08/07/13 1330 08/08/13 0605 08/09/13 0630  WBC 12.2* 12.6* 14.3*  HGB 11.0* 10.5* 8.5*  HCT 30.8* 29.5* 23.7*  MCV 88.3 87.5 87.1  PLT 291 285 264   Cardiac Enzymes: No results found for this basename: CKTOTAL, CKMB, CKMBINDEX, TROPONINI,  in the last 168 hours BNP (last 3  results) No results found for this basename: PROBNP,  in the last 8760 hours CBG:  Recent Labs Lab 08/08/13 1126 08/08/13 1645 08/08/13 2150 08/09/13 0655 08/09/13 1116  GLUCAP 191* 175* 144* 145* 159*    No results found for this or any previous visit (from the past 240 hour(s)).    Additional labs: 1. Hemoglobin A1c: 7.1     Studies: No results found.      Scheduled Meds: . allopurinol  100 mg Oral Daily  . cloNIDine  0.1 mg Oral Daily  . docusate sodium  100 mg Oral BID  . enoxaparin (LOVENOX) injection  30 mg Subcutaneous Q12H   . hydrALAZINE  25 mg Oral BID  . insulin aspart  0-15 Units Subcutaneous TID WC  . irbesartan  150 mg Oral Daily  . isosorbide mononitrate  45 mg Oral Daily  . metFORMIN  500 mg Oral Q breakfast  . metoprolol succinate  100 mg Oral Daily  . mometasone-formoterol  2 puff Inhalation BID  . predniSONE  5 mg Oral BID  . verapamil  240 mg Oral Daily   Continuous Infusions: . 0.9 % NaCl with KCl 20 mEq / L 75 mL/hr at 08/09/13 1425    Active Problems:   DIABETES MELLITUS, TYPE II   HTN (hypertension)   Hyponatremia   Acute post-hemorrhagic anemia    Time spent: 30 minutes    [facility]  [facility] Pager [phone].   If 8PM-8AM, please contact night-coverage at [email or web address], password [facility] 08/09/2013, 3:58 PM  LOS: 2 days

## 2013-08-09 NOTE — Progress Notes (Signed)
Occupational Therapy Treatment Patient Details Name: [name] [name] MRN: [id number] DOB: [birth year] Today's Date: 08/09/2013 Time: 9147-8295 OT Time Calculation (min): 21 min  OT Assessment / Plan / Recommendation       OT comments  Pt. Moving slow, easily fatigued and sob with physical activity.  Max a for lb dressing, has a/e and will need to use for lb bathe/dress but states she will also have family assist at d/c.  Max a for all aspects of toileting, including pivot to bsc.  Will benefit/need snf placement for con't. Therapy   Follow Up Recommendations  SNF           Equipment Recommendations  3 in 1 bedside comode        Frequency Min 2X/week   Progress towards OT Goals Progress towards OT goals: Progressing toward goals  Plan Discharge plan remains appropriate    Precautions / Restrictions Precautions Precautions: Fall;Knee   Pertinent Vitals/Pain Reports 0/10 prior to activity, rn notified after therapy as pt. Requesting pain meds with reports of 8/10 after activity    ADL  Lower Body Bathing: Performed;Maximal assistance Where Assessed - Lower Body Bathing: Supported sitting Toilet Transfer: Simulated;Moderate assistance;Maximal assistance Toileting - Clothing Manipulation and Hygiene: Simulated;Moderate assistance;Maximal assistance Equipment Used: Gait belt;Rolling walker Transfers/Ambulation Related to ADLs: moves slow, max inst. cues for initiating weight shift for steps, only sliding feet forward still not using full weight bearing through LLE  ADL Comments: attempted lb dressing (donning/doffing socks), pt. unable to reach feet with previous methods used at home, states she has a/e from previous surgeries and will use that or will also have family assist at d/c.  sim. bsc transfer.  pt. mod/max for all aspects       OT Goals(current goals can now be found in the care plan section)    Visit Information  Last OT Received On: 08/09/13                  Cognition  Cognition Arousal/Alertness: Awake/alert Behavior During Therapy: [facility] for tasks assessed/performed Overall Cognitive Status: Within Functional Limits for tasks assessed    Mobility  Transfers Transfers: Sit to Stand;Stand to Sit Sit to Stand: 1: +2 Total assist;From chair/3-in-1;With upper extremity assist Sit to Stand: Patient Percentage: 50% Stand to Sit: 1: +2 Total assist;To chair/3-in-1;With upper extremity assist Stand to Sit: Patient Percentage: 50% Details for Transfer Assistance: max inst. and tactile guidance for hand placement, negotiating feet for steps, and cues for controlled descent to chair               End of Session OT - End of Session Equipment Utilized During Treatment: Gait belt;Rolling walker Activity Tolerance: Patient limited by fatigue Patient left: in chair;Other (comment) (with PTA in room with pt.)       Robet Leu, COTA/L 08/09/2013, 8:16 AM

## 2013-08-10 LAB — CBC
HCT: 22.7 % — ABNORMAL LOW (ref 36.0–46.0)
Hemoglobin: 8.2 g/dL — ABNORMAL LOW (ref 12.0–15.0)
MCH: 31.9 pg (ref 26.0–34.0)
MCHC: 36.1 g/dL — ABNORMAL HIGH (ref 30.0–36.0)
MCV: 88.3 fL (ref 78.0–100.0)
Platelets: 282 10*3/uL (ref 150–400)
RBC: 2.57 MIL/uL — ABNORMAL LOW (ref 3.87–5.11)
RDW: 16.1 % — ABNORMAL HIGH (ref 11.5–15.5)
WBC: 14.7 10*3/uL — ABNORMAL HIGH (ref 4.0–10.5)

## 2013-08-10 LAB — BASIC METABOLIC PANEL
BUN: 15 mg/dL (ref 6–23)
CO2: 20 mEq/L (ref 19–32)
Calcium: 8.2 mg/dL — ABNORMAL LOW (ref 8.4–10.5)
Chloride: 94 mEq/L — ABNORMAL LOW (ref 96–112)
Creatinine, Ser: 0.81 mg/dL (ref 0.50–1.10)
GFR calc Af Amer: 77 mL/min — ABNORMAL LOW (ref >90)
GFR calc non Af Amer: 66 mL/min — ABNORMAL LOW (ref >90)
Glucose, Bld: 160 mg/dL — ABNORMAL HIGH (ref 70–99)
Potassium: 4.2 mEq/L (ref 3.5–5.1)
Sodium: 126 mEq/L — ABNORMAL LOW (ref 135–145)

## 2013-08-10 LAB — GLUCOSE, CAPILLARY
Glucose-Capillary: 154 mg/dL — ABNORMAL HIGH (ref 70–99)
Glucose-Capillary: 139 mg/dL — ABNORMAL HIGH (ref 70–99)

## 2013-08-10 LAB — OSMOLALITY: Osmolality: 264 mOsm/kg — ABNORMAL LOW (ref 275–300)

## 2013-08-10 LAB — OSMOLALITY, URINE: Osmolality, Ur: 282 mOsm/kg — ABNORMAL LOW (ref 390–1090)

## 2013-08-10 NOTE — Progress Notes (Signed)
Physical Therapy Treatment Patient Details Name: [name] [name] MRN: [id number] DOB: May 05, 1930 Today's Date: 08/10/2013 Time:  -     PT Assessment / Plan / Recommendation  History of Present Illness s/p L TKA   PT Comments   Pt not progressing well with ability to ambulate at this date but did improve with sit<>stand transfers.    Follow Up Recommendations  SNF;Supervision/Assistance - 24 hour     Does the patient have the potential to tolerate intense rehabilitation     Barriers to Discharge        Equipment Recommendations  Rolling walker with 5" wheels;3in1 (PT)    Recommendations for Other Services OT consult  Frequency 7X/week   Progress towards PT Goals Progress towards PT goals: Progressing toward goals (slowly)  Plan Current plan remains appropriate    Precautions / Restrictions Precautions Precautions: Fall;Knee Restrictions LLE Weight Bearing: Weight bearing as tolerated   Pertinent Vitals/Pain C/o Lt knee pain but did not rate.  Premedicated.      Mobility  Bed Mobility Bed Mobility: Supine to Sit;Sitting - Scoot to Edge of Bed Supine to Sit: 3: Mod assist;HOB elevated;With rails Sitting - Scoot to Edge of Bed: 3: Mod assist Details for Bed Mobility Assistance: Cues for sequencing & technique.  (A) to lift shoulders/trunk to sitting upright, LLE management, use of draw pad to pivot/square hips up on EOB.   Transfers Transfers: Sit to Stand;Stand to [employer] Transfers Sit to Stand: 3: Mod assist;With upper extremity assist;4: Min assist;With armrests;From bed;From chair/3-in-1 Stand to Sit: 4: Min assist;With upper extremity assist;With armrests;To chair/3-in-1 Stand Pivot Transfers: 4: Min assist Details for Transfer Assistance: cues for hand placement & technique.  Mod (A) to stand from bed & lower surface (recliner) but able to stand with Min (A) from 3-in-1.  Facilitation for more upright posture & to shift weight to Lt.  Pt remains flexed at hips  & leaning to Rt side.   Ambulation/Gait Ambulation/Gait Assistance: 1: +2 Total assist Ambulation/Gait: Patient Percentage: 60% Ambulation Distance (Feet):  (3 shuffle steps forwards) Assistive device: Rolling walker Ambulation/Gait Assistance Details: Pt unable to shift weight to LLE to allow sufficient step with RLE.   Gait Pattern: Shuffle;Decreased step length - right;Decreased step length - left General Gait Details: very effortful.  +SOB Stairs: No Wheelchair Mobility Wheelchair Mobility: No      PT Goals (current goals can now be found in the care plan section) Acute Rehab PT Goals Patient Stated Goal: get back to doing everything PT Goal Formulation: With patient Time For Goal Achievement: 08/14/13 Potential to Achieve Goals: Good  Visit Information  Last PT Received On: 08/10/13 Assistance Needed: +1 History of Present Illness: s/p L TKA    Subjective Data  Patient Stated Goal: get back to doing everything   Cognition  Cognition Arousal/Alertness: Awake/alert Behavior During Therapy: WFL for tasks assessed/performed Overall Cognitive Status: Within Functional Limits for tasks assessed    Balance     End of Session PT - End of Session Equipment Utilized During Treatment: Gait belt Activity Tolerance: Patient limited by fatigue;Patient limited by pain Patient left: in chair;with call bell/phone within reach Nurse Communication: Mobility status   GP     [name] 08/10/2013, 12:45 PM   [name], PTA [phone] 08/10/2013

## 2013-08-10 NOTE — Progress Notes (Signed)
SPORTS MEDICINE AND JOINT REPLACEMENT  Georgena Spurling, MD   Altamese Cabal, PA-C [address] [place], [place], [place]  [address]                             [phone]   PROGRESS NOTE  Subjective:  negative for Chest Pain  negative for Shortness of Breath  negative for Nausea/Vomiting   negative for Calf Pain  negative for Bowel Movement   Tolerating Diet: yes         Patient reports pain as 5 on 0-10 scale.    Objective: Vital signs in last 24 hours:   Patient Vitals for the past 24 hrs:  BP Temp Temp src Pulse Resp SpO2  08/10/13 0957 - - - - - 94 %  08/10/13 0949 148/63 mmHg 97.3 F (36.3 C) - 106 18 100 %  08/10/13 0603 149/69 mmHg 98.9 F (37.2 C) Oral 99 18 98 %  08/10/13 0400 - - - - 18 97 %  08/10/13 0000 - - - - 18 97 %  08/09/13 2229 138/72 mmHg 98.1 F (36.7 C) Oral 86 18 97 %  08/09/13 2000 - - - - 18 97 %  08/09/13 1600 - - - - 18 -  08/09/13 1406 139/73 mmHg 98.6 F (37 C) - 96 18 96 %    @flow {1959:LAST@   Intake/Output from previous day:   10/01 0701 - 10/02 0700 In: 840 [P.O.:840] Out: 1750 [Urine:1750]   Intake/Output this shift:   10/02 0701 - 10/02 1900 In: 240 [P.O.:240] Out: -    Intake/Output     10/01 0701 - 10/02 0700 10/02 0701 - 10/03 0700   P.O. 840 240   Total Intake 840 240   Urine 1750    Drains     Total Output 1750     Net -910 +240        Urine Occurrence 3 x 1 x      LABORATORY DATA:  Recent Labs  08/07/13 1330 08/08/13 0605 08/09/13 0630 08/10/13 0405  WBC 12.2* 12.6* 14.3* 14.7*  HGB 11.0* 10.5* 8.5* 8.2*  HCT 30.8* 29.5* 23.7* 22.7*  PLT 291 285 264 282    Recent Labs  08/07/13 1330 08/08/13 0605 08/09/13 0630 08/10/13 0405  NA  --  123* 125* 126*  K  --  3.1* 3.8 4.2  CL  --  88* 92* 94*  CO2  --  23 22 20   BUN  --  7 12 15   CREATININE 0.53 0.58 0.82 0.81  GLUCOSE  --  180* 142* 160*  CALCIUM  --  8.3* 8.0* 8.2*   Lab Results  Component Value Date   INR 0.96 07/28/2013     Examination:  General appearance: alert, cooperative and no distress Extremities: Homans sign is negative, no sign of DVT  Wound Exam: clean, dry, intact   Drainage:  None: wound tissue dry  Motor Exam: EHL and FHL Intact  Sensory Exam: Deep Peroneal normal   Assessment:    3 Days Post-Op  Procedure(s) (LRB): TOTAL KNEE ARTHROPLASTY- LEFT (Left)  ADDITIONAL DIAGNOSIS:  Active Problems:   DIABETES MELLITUS, TYPE II   HTN (hypertension)   Hyponatremia   Acute post-hemorrhagic anemia  Acute Blood Loss Anemia   Plan: Physical Therapy as ordered Weight Bearing as Tolerated (WBAT)  DVT Prophylaxis:  Lovenox  DISCHARGE PLAN: Skilled Nursing Facility/Rehab  DISCHARGE NEEDS: HHPT, CPM, Walker and 3-in-1 comode  seat         Nohealani Medinger 08/10/2013, 1:02 PM

## 2013-08-10 NOTE — Progress Notes (Signed)
[facility] PROGRESS NOTE  [name] [name] ZOX:[id number] DOB: [birth year] DOA: 08/07/2013 PCP: Oneal Grout, MD  HPI/Brief narrative 79 year old female with history of HTN, HL, CAD, type II DM, underwent left TKA on 08/08/13 for wash arthritis. Hospitalists were consulted for assistance in management of hypertension.  Assessment/Plan:  Hypertension - Probably elevated preoperatively secondary to pain and missing her routine medications. - Improved control. - Continue clonidine, hydralazine, irbesartan (no HCTZ), metoprolol and verapamil. Looking at polypharmacy, probably a difficult to control hypertensive.  Hyponatremia - Possibly SIADH secondary to pain & HCTZ - Sodium 129 > 123 > 125 >126 - HCTZ held. Resumed Lasix and follow BMP - Serum osmolarity 264 and urine osmolarity 282: Not fully consistent with SIADH - Discussed with orthopedics: Mr. Altamese Cabal, PA and advised holding off on HCTZ and followup BMP at SNF in 2-3 days.  Postop Acute blood loss anemia  - EBL of that surgery 300 mL. - Follow CBCs and transfuse if hemoglobin <7 g per DL.  - Recommend followup of CBC at SNF. - Stable over the last 24 hours. - Mild leukocytosis, possibly stress reaction & recent surgery. No clinical focus of sepsis. No fevers.  Type II DM - Reasonable inpatient control. - Resume Metformin at discharge.  Hypokalemia - Repleted.  History of CAD - Asymptomatic.  Status post left TKA on 08/08/13 - Management per orthopedics.  DVT prophylaxis: Lovenox  Lines/catheters: PIV  Nutrition: Diabetic  Activity:  Weight bearing as tolerated  Code Status: Full Family Communication: Discussed with daughter at bedside.  Disposition Plan: Patient being discharged to SNF today by primary service    Procedures:  Left TKA on 9/30   Antibiotics:  None    Subjective: Mild left knee pain. Denies dyspnea, chest pain, dizziness or lightheadedness.  Objective: Filed Vitals:   08/10/13 0949 08/10/13 0957 08/10/13 1500 08/10/13 1514  BP: 148/63  138/72 121/60  Pulse: 106  99 74  Temp: 97.3 F (36.3 C)  97.1 F (36.2 C) 97 F (36.1 C)  TempSrc:    Oral  Resp: 18  18 16   SpO2: 100% 94% 95% 95%    Intake/Output Summary (Last 24 hours) at 08/10/13 1625 Last data filed at 08/10/13 1500  Gross per 24 hour  Intake    690 ml  Output    250 ml  Net    440 ml   There were no vitals filed for this visit.   Exam:  General exam: Moderately built and nourished female patient sitting comfortably on chair  Respiratory system: Clear. No increased work of breathing. Cardiovascular system: S1 & S2 heard, RRR. No JVD, murmurs, gallops, clicks or pedal edema. Gastrointestinal system: Abdomen is nondistended, soft and nontender. Normal bowel sounds heard. Central nervous system: Alert and oriented. No focal neurological deficits. Extremities: Symmetric 5 x 5 power.Left knee dressing site, clean and dry.    Data Reviewed: Basic Metabolic Panel:  Recent Labs Lab 08/07/13 1330 08/08/13 0605 08/09/13 0630 08/10/13 0405  NA  --  123* 125* 126*  K  --  3.1* 3.8 4.2  CL  --  88* 92* 94*  CO2  --  23 22 20   GLUCOSE  --  180* 142* 160*  BUN  --  7 12 15   CREATININE 0.53 0.58 0.82 0.81  CALCIUM  --  8.3* 8.0* 8.2*   Liver Function Tests: No results found for this basename: AST, ALT, ALKPHOS, BILITOT, PROT, ALBUMIN,  in the last 168 hours No results found  for this basename: LIPASE, AMYLASE,  in the last 168 hours No results found for this basename: AMMONIA,  in the last 168 hours CBC:  Recent Labs Lab 08/07/13 1330 08/08/13 0605 08/09/13 0630 08/10/13 0405  WBC 12.2* 12.6* 14.3* 14.7*  HGB 11.0* 10.5* 8.5* 8.2*  HCT 30.8* 29.5* 23.7* 22.7*  MCV 88.3 87.5 87.1 88.3  PLT 291 285 264 282   Cardiac Enzymes: No results found for this basename: CKTOTAL, CKMB, CKMBINDEX, TROPONINI,  in the last 168 hours BNP (last 3 results) No results found for this  basename: PROBNP,  in the last 8760 hours CBG:  Recent Labs Lab 08/09/13 1116 08/09/13 1647 08/09/13 2226 08/10/13 0629 08/10/13 1136  GLUCAP 159* 176* 158* 154* 139*    No results found for this or any previous visit (from the past 240 hour(s)).    Additional labs: 1. Hemoglobin A1c: 7.1  [facility] will sign off at this time. Please contact us for any further assistance.   Studies: No results found.      Scheduled Meds: . allopurinol  100 mg Oral Daily  . cloNIDine  0.1 mg Oral Daily  . docusate sodium  100 mg Oral BID  . enoxaparin (LOVENOX) injection  30 mg Subcutaneous Q12H  . furosemide  20 mg Oral Daily  . hydrALAZINE  25 mg Oral BID  . insulin aspart  0-15 Units Subcutaneous TID WC  . irbesartan  150 mg Oral Daily  . isosorbide mononitrate  45 mg Oral Daily  . metoprolol succinate  100 mg Oral Daily  . mometasone-formoterol  2 puff Inhalation BID  . predniSONE  5 mg Oral BID  . verapamil  240 mg Oral Daily   Continuous Infusions:    Active Problems:   DIABETES MELLITUS, TYPE II   HTN (hypertension)   Hyponatremia   Acute post-hemorrhagic anemia    Time spent: 30 minutes    [facility]  [facility] Pager [phone].   If 8PM-8AM, please contact night-coverage at [email or web address], password [facility] 08/10/2013, 4:25 PM  LOS: 3 days

## 2013-08-10 NOTE — Discharge Summary (Signed)
SPORTS MEDICINE & JOINT REPLACEMENT   [name] Spurling, MD   [name] Cabal, PA-C [address] [place], [place], [place]  [address]                             [phone]  PATIENT ID: [name] [name]        MRN:  [id number]          DOB/AGE: 1930-04-08 / 79 y.o.    DISCHARGE SUMMARY  ADMISSION DATE:    08/07/2013 DISCHARGE DATE:   08/10/2013   ADMISSION DIAGNOSIS: osteoarthritis left knee    DISCHARGE DIAGNOSIS:  osteoarthritis left knee    ADDITIONAL DIAGNOSIS: Active Problems:   DIABETES MELLITUS, TYPE II   HTN (hypertension)   Hyponatremia   Acute post-hemorrhagic anemia  Past Medical History  Diagnosis Date  . Hypertension   . First degree atrioventricular block   . Onychia and paronychia of toe   . Muscle weakness (generalized)   . Allergic rhinitis due to pollen   . Gout, unspecified   . Disorder of bone and cartilage, unspecified   . Edema   . Hyperlipidemia   . Unspecified essential hypertension   . Coronary atherosclerosis of native coronary artery   . Other malaise and fatigue   . Cough   . Other fall   . Unspecified vitamin D deficiency   . TIA (transient ischemic attack)     "a few one summer" (08/07/2013)  . Myocardial infarction ~ 1970  . Exertional shortness of breath     "sometimes" (08/07/2013)  . Type II diabetes mellitus     "fasting 90-110s" (08/07/2013)  . Osteoarthrosis, unspecified whether generalized or localized, unspecified site   . Prepatellar bursitis   . Arthritis     "in q joint" (08/07/2013)    PROCEDURE: Procedure(s): TOTAL KNEE ARTHROPLASTY- LEFT on 08/07/2013  CONSULTS: Treatment Team:  [name] Hews, MD   HISTORY:  See H&P in chart  HOSPITAL COURSE:  [name] [name] is a 79 y.o. admitted on 08/07/2013 and found to have a diagnosis of osteoarthritis left knee.  After appropriate laboratory studies were obtained  they were taken to the operating room on 08/07/2013 and underwent Procedure(s): TOTAL KNEE ARTHROPLASTY- LEFT.    They were given perioperative antibiotics:  Anti-infectives   Start     Dose/Rate Route Frequency Ordered Stop   08/07/13 1500  ceFAZolin (ANCEF) IVPB 2 g/50 mL premix     2 g 100 mL/hr over 30 Minutes Intravenous Every 6 hours 08/07/13 1312 08/07/13 2226   08/07/13 0600  ceFAZolin (ANCEF) IVPB 2 g/50 mL premix     2 g 100 mL/hr over 30 Minutes Intravenous On call to O.R. 08/06/13 1336 08/07/13 0900    .  Tolerated the procedure well.  Placed with a foley intraoperatively.  Given Ofirmev at induction and for 48 hours.    POD# 1: Vital signs were stable.  Patient denied Chest pain, shortness of breath, or calf pain.  Patient was started on Lovenox 30 mg subcutaneously twice daily at 8am.  Consults to PT, OT, and care management were made.  The patient was weight bearing as tolerated.  CPM was placed on the operative leg 0-90 degrees for 6-8 hours a day.  Incentive spirometry was taught.  Dressing was changed.  Marcaine pump and hemovac were discontinued.      POD #2, Continued  PT for ambulation and exercise program.  IV saline locked.  O2 discontinued.    The remainder of the hospital course was dedicated to ambulation and strengthening.   The patient was discharged on 3 Days Post-Op in  Good condition.  Blood products given:none  DIAGNOSTIC STUDIES: Recent vital signs: Patient Vitals for the past 24 hrs:  BP Temp Temp src Pulse Resp SpO2  08/10/13 0957 - - - - - 94 %  08/10/13 0949 148/63 mmHg 97.3 F (36.3 C) - 106 18 100 %  08/10/13 0603 149/69 mmHg 98.9 F (37.2 C) Oral 99 18 98 %  08/10/13 0400 - - - - 18 97 %  08/10/13 0000 - - - - 18 97 %  08/09/13 2229 138/72 mmHg 98.1 F (36.7 C) Oral 86 18 97 %  08/09/13 2000 - - - - 18 97 %  08/09/13 1600 - - - - 18 -  08/09/13 1406 139/73 mmHg 98.6 F (37 C) - 96 18 96 %       Recent laboratory studies:  Recent Labs  08/07/13 1330 08/08/13 0605 08/09/13 0630 08/10/13 0405  WBC 12.2* 12.6* 14.3* 14.7*  HGB 11.0* 10.5*  8.5* 8.2*  HCT 30.8* 29.5* 23.7* 22.7*  PLT 291 285 264 282    Recent Labs  08/07/13 1330 08/08/13 0605 08/09/13 0630 08/10/13 0405  NA  --  123* 125* 126*  K  --  3.1* 3.8 4.2  CL  --  88* 92* 94*  CO2  --  23 22 20   BUN  --  7 12 15   CREATININE 0.53 0.58 0.82 0.81  GLUCOSE  --  180* 142* 160*  CALCIUM  --  8.3* 8.0* 8.2*   Lab Results  Component Value Date   INR 0.96 07/28/2013     Recent Radiographic Studies :  Dg Chest 2 View  07/28/2013   CLINICAL DATA:  Preop left knee replacement. Diabetic hypertensive patient.  EXAM: CHEST  2 VIEW  COMPARISON:  05/03/2013.  FINDINGS: Cardiomegaly.  Mild central pulmonary vascular prominence without frank pulmonary edema.  No segmental infiltrate or pneumothorax.  Markedly tortuous aorta.  Marked bilateral shoulder joint degenerative changes greater on the left.  Thoracic spine degenerative changes. Mild loss of height L1 superior endplate stable.  On the lateral view, screws within soft tissue projecting posterior to the cervical spine are noted with 1 screw fractured. Exact etiology indeterminate.  IMPRESSION: Cardiomegaly.  Mild central pulmonary vascular prominence without frank pulmonary edema.  Markedly tortuous aorta.  Marked bilateral shoulder joint degenerative changes greater on the left.  Thoracic spine degenerative changes. Mild loss of height L1 superior endplate stable.  On the lateral view, screws within soft tissue projecting posterior to the cervical spine are noted with 1 screw fractured. Exact etiology indeterminate.   Electronically Signed   By: Bridgett Larsson   On: 07/28/2013 14:34    DISCHARGE INSTRUCTIONS: Discharge Orders   Future Appointments Provider Department Dept Phone   08/21/2013 8:45 AM [facility] [phone]   08/23/2013 11:00 AM Mahima Glade Lloyd, MD [facility] [phone]   Future Orders Complete By Expires   Call MD / Call 911  As directed    Comments:     If you experience  chest pain or shortness of breath, CALL 911 and be transported to the hospital emergency room.  If you develope a fever above 101 F, pus (white drainage) or increased drainage or redness at the wound, or calf pain, call your surgeon's office.   Change dressing  As  directed    Comments:     Change dressing on friday, then change the dressing daily with sterile 4 x 4 inch gauze dressing and apply TED hose.   Constipation Prevention  As directed    Comments:     Drink plenty of fluids.  Prune juice may be helpful.  You may use a stool softener, such as Colace (over the counter) 100 mg twice a day.  Use MiraLax (over the counter) for constipation as needed.   CPM  As directed    Comments:     Continuous passive motion machine (CPM):      Use the CPM from 0 to 90 for 6-8 hours per day.      You may increase by 10 per day.  You may break it up into 2 or 3 sessions per day.      Use CPM for 3 weeks or until you are told to stop.   Diet - low sodium heart healthy  As directed    Do not put a pillow under the knee. Place it under the heel.  As directed    Driving restrictions  As directed    Comments:     No driving for 6 weeks   Increase activity slowly as tolerated  As directed    Lifting restrictions  As directed    Comments:     No lifting for 6 weeks   TED hose  As directed    Comments:     Use stockings (TED hose) for 3 weeks on both leg(s).  You may remove them at night for sleeping.      DISCHARGE MEDICATIONS:     Medication List    STOP taking these medications       aspirin EC 81 MG tablet     diclofenac sodium 1 % Gel  Commonly known as:  VOLTAREN     HYDROcodone-acetaminophen 5-325 MG per tablet  Commonly known as:  NORCO/VICODIN      TAKE these medications       acetaminophen 500 MG tablet  Commonly known as:  TYLENOL  Take 500 mg by mouth 2 (two) times daily as needed for pain (for breakthru pain (takes between vicodin doses)).     albuterol 108 (90 BASE) MCG/ACT  inhaler  Commonly known as:  PROVENTIL HFA;VENTOLIN HFA  Inhale 2 puffs into the lungs every 6 (six) hours as needed for wheezing.     allopurinol 100 MG tablet  Commonly known as:  ZYLOPRIM  Take 100 mg by mouth daily. For gout     AMBULATORY NON FORMULARY MEDICATION  Regular Mattress for Hospital Bed     AMBULATORY NON FORMULARY MEDICATION  Gel Over-lay for hospital bed.     amoxicillin-clavulanate 875-125 MG per tablet  Commonly known as:  AUGMENTIN  Take 1 tablet by mouth 2 (two) times daily.     CALCIUM 500 PO  Take 1 tablet by mouth daily. Take one tablet once daily     cloNIDine 0.1 MG tablet  Commonly known as:  CATAPRES  Take 1 tablet (0.1 mg total) by mouth daily.     enoxaparin 40 MG/0.4ML injection  Commonly known as:  LOVENOX  Inject 0.4 mLs (40 mg total) into the skin daily.     Fluticasone-Salmeterol 250-50 MCG/DOSE Aepb  Commonly known as:  ADVAIR DISKUS  Inhale 1 puff into the lungs 2 (two) times daily.     furosemide 20 MG tablet  Commonly known as:  LASIX  Take 20 mg by mouth daily. 1 by mouth daily     hydrALAZINE 25 MG tablet  Commonly known as:  APRESOLINE  Take 25 mg by mouth 2 (two) times daily.     isosorbide mononitrate 30 MG 24 hr tablet  Commonly known as:  IMDUR  Take 45 mg by mouth daily.     metFORMIN 500 MG tablet  Commonly known as:  GLUCOPHAGE  Take 500 mg by mouth daily with breakfast. For diabetes     methocarbamol 500 MG tablet  Commonly known as:  ROBAXIN  Take 1-2 tablets (500-1,000 mg total) by mouth every 6 (six) hours as needed.     metoprolol succinate 100 MG 24 hr tablet  Commonly known as:  TOPROL-XL  Take 100 mg by mouth daily. Take with or immediately following a meal.     oxyCODONE 5 MG immediate release tablet  Commonly known as:  Oxy IR/ROXICODONE  Take 1-2 tablets (5-10 mg total) by mouth every 4 (four) hours as needed.     polyvinyl alcohol 1.4 % ophthalmic solution  Commonly known as:  LIQUIFILM TEARS   Place 1 drop into both eyes as needed (for dry eyes).     predniSONE 5 MG tablet  Commonly known as:  DELTASONE  Take 5 mg by mouth 2 (two) times daily.     triamcinolone cream 0.1 %  Commonly known as:  KENALOG  Apply 1 application topically 2 (two) times daily as needed (to affected area).     valsartan-hydrochlorothiazide 160-25 MG per tablet  Commonly known as:  DIOVAN-HCT  Take 1 tablet by mouth daily. For blood pressure     verapamil 240 MG 24 hr capsule  Commonly known as:  VERELAN PM  Take 240 mg by mouth daily.     VITAMIN D PO  Take 1 tablet by mouth daily.        FOLLOW UP VISIT:       Follow-up Information   Follow up with Raymon Mutton, MD. Call on 08/22/2013.   Specialty:  Orthopedic Surgery   Contact information:   [address] [place] [address] [phone]       DISPOSITION: SNF  CONDITION:  Good   [name] [name] 08/10/2013, 1:05 PM

## 2013-08-10 NOTE — Progress Notes (Signed)
IV removed from patient this afternoon. Patient d/c to [facility] this afternoon without issue.  Report called and given.

## 2013-08-10 NOTE — Progress Notes (Signed)
Orthopedic Tech Progress Note Patient Details:  [name] [name] 1930-09-22 [id number] Knee immobilizer applied.  Ortho Devices Type of Ortho Device: Knee Immobilizer Ortho Device/Splint Location: Left LE Ortho Device/Splint Interventions: Application   Asia R Thompson 08/10/2013, 1:32 PM

## 2013-08-11 ENCOUNTER — Non-Acute Institutional Stay (SKILLED_NURSING_FACILITY): Payer: Medicare Other | Admitting: Adult Health

## 2013-08-11 DIAGNOSIS — I10 Essential (primary) hypertension: Secondary | ICD-10-CM

## 2013-08-11 DIAGNOSIS — D62 Acute posthemorrhagic anemia: Secondary | ICD-10-CM

## 2013-08-11 DIAGNOSIS — M1990 Unspecified osteoarthritis, unspecified site: Secondary | ICD-10-CM

## 2013-08-11 DIAGNOSIS — E119 Type 2 diabetes mellitus without complications: Secondary | ICD-10-CM

## 2013-08-11 DIAGNOSIS — M109 Gout, unspecified: Secondary | ICD-10-CM

## 2013-08-15 NOTE — Progress Notes (Signed)
Patient ID: [name] [name], female   DOB: 1930/01/29, 79 y.o.   MRN: [id number]       PROGRESS NOTE  DATE: 08/11/2013  FACILITY: Nursing Home Location: [facility]  LEVEL OF CARE: SNF (31)  Acute Visit  CHIEF COMPLAINT:  Follow-up hospitalization  HISTORY OF PRESENT ILLNESS: This is an 79 year old female who has been admitted to [facility] on 08/10/13 from [facility] with Osteoarthritis S/P Left Total Knee Arthroplasty. She has been admitted for a short-term rehabilitation.  REASSESSMENT OF ONGOING PROBLEM(S):  HTN: Pt 's HTN remains stable.  Denies CP, sob, DOE, pedal edema, headaches, dizziness or visual disturbances.  No complications from the medications currently being used.  Last BP : 113/59  GOUT: Patien'st  gout remains stable. Patient denies joint pan, redness, swelling or warmth. No complications reported from the medications presently being used.  ANEMIA: The anemia has been stable. The patient denies fatigue, melena or hematochezia. No complications from the medications currently being used.  hgb 10/14 8.2 PAST MEDICAL HISTORY : Reviewed.  No changes. I.  CURRENT MEDICATIONS: Reviewed per MAR  REVIEW OF SYSTEMS:  GENERAL: no change in appetite, no fatigue, no weight changes, no fever, chills or weakness RESPIRATORY: no cough, SOB, DOE, wheezing, hemoptysis CARDIAC: no chest pain, edema or palpitations GI: no abdominal pain, diarrhea, constipation, heart burn, nausea or vomiting  PHYSICAL EXAMINATION  VS:  T 98.6       P 90      RR 20      BP 113/59         WT 203.2 (Lb)  GENERAL: no acute distress, normal body habitus EYES: conjunctivae normal, sclerae normal, normal eye lids NECK: supple, trachea midline, no neck masses, no thyroid tenderness, no thyromegaly LYMPHATICS: no LAN in the neck, no supraclavicular LAN RESPIRATORY: breathing is even & unlabored, BS CTAB CARDIAC: RRR, no murmur,no extra heart sounds, no edema GI: abdomen  soft, normal BS, no masses, no tenderness, no hepatomegaly, no splenomegaly PSYCHIATRIC: the patient is alert & oriented to person, affect & behavior appropriate  LABS/RADIOLOGY: 08/10/13 WBC 14.7 hemoglobin 8 point hematocrit 22.7 sodium 126 potassium 4.2 BUN 15  creatinine 0.81 glucose 160 calcium 8.2  ASSESSMENT/PLAN:  Osteoarthritis status post left total knee arthroplasty - for rehabilitation  Hypertension - well controlled  Anemia, acute blood loss - stable  Diabetes mellitus, type II - well controlled  Gout - stable    CPT CODE: 40981

## 2013-08-16 ENCOUNTER — Non-Acute Institutional Stay (SKILLED_NURSING_FACILITY): Payer: Medicare Other | Admitting: Internal Medicine

## 2013-08-16 DIAGNOSIS — D62 Acute posthemorrhagic anemia: Secondary | ICD-10-CM

## 2013-08-16 DIAGNOSIS — E119 Type 2 diabetes mellitus without complications: Secondary | ICD-10-CM

## 2013-08-16 DIAGNOSIS — D7289 Other specified disorders of white blood cells: Secondary | ICD-10-CM

## 2013-08-16 DIAGNOSIS — M1710 Unilateral primary osteoarthritis, unspecified knee: Secondary | ICD-10-CM

## 2013-08-21 ENCOUNTER — Other Ambulatory Visit: Payer: Medicare Other

## 2013-08-22 ENCOUNTER — Non-Acute Institutional Stay (SKILLED_NURSING_FACILITY): Payer: Medicare Other | Admitting: Adult Health

## 2013-08-22 DIAGNOSIS — E119 Type 2 diabetes mellitus without complications: Secondary | ICD-10-CM

## 2013-08-22 DIAGNOSIS — M109 Gout, unspecified: Secondary | ICD-10-CM

## 2013-08-22 DIAGNOSIS — M1990 Unspecified osteoarthritis, unspecified site: Secondary | ICD-10-CM

## 2013-08-22 DIAGNOSIS — I10 Essential (primary) hypertension: Secondary | ICD-10-CM

## 2013-08-22 DIAGNOSIS — D62 Acute posthemorrhagic anemia: Secondary | ICD-10-CM

## 2013-08-23 ENCOUNTER — Ambulatory Visit: Payer: Medicare Other | Admitting: Internal Medicine

## 2013-08-23 NOTE — Progress Notes (Signed)
Patient ID: [name] [name], female   DOB: Nov 04, 1930, 79 y.o.   MRN: [id number]       PROGRESS NOTE  DATE: 08/22/2013   FACILITY: [facility]  LEVEL OF CARE: SNF (31)  Discharge Visit  CHIEF COMPLAINT:  Manage  HISTORY OF PRESENT ILLNESS: This is an 79 year old female who is for discharge home with Home health PT, OT and Nursing. She has been admitted to [facility] on 08/10/13 from [facility] with Osteoarthritis S/P Left Total Knee Arthroplasty. Patient was admitted to this facility for short-term rehabilitation after the patient's recent hospitalization.  Patient has completed SNF rehabilitation and therapy has cleared the patient for discharge.  Reassessment of ongoing problem(s):  HTN: Pt 's HTN remains stable.  Denies CP, sob, DOE, pedal edema, headaches, dizziness or visual disturbances.  No complications from the medications currently being used.  Last BP : 141/73  DM:pt's DM remains stable.  Pt denies polyuria, polydipsia, polyphagia, changes in vision or hypoglycemic episodes.  No complications noted from the medication presently being used. 10/14hemoglobin A1c6.8  GOUT: Patien'st  gout remains stable. Patient denies joint pan, redness, swelling or warmth. No complications reported from the medications presently being used.   PAST MEDICAL HISTORY : Reviewed.  No changes.  CURRENT MEDICATIONS: Reviewed per [facility]  REVIEW OF SYSTEMS:  GENERAL: no change in appetite, no fatigue, no weight changes, no fever, chills or weakness RESPIRATORY: no cough, SOB, DOE, wheezing, hemoptysis CARDIAC: no chest pain, edema or palpitations GI: no abdominal pain, diarrhea, constipation, heart burn, nausea or vomiting  PHYSICAL EXAMINATION  VS:  T 99.7      P78       RR20      BP141/73      POX96 %       WT196.8 (Lb)  GENERAL: no acute distress, normal body habitus EYES: conjunctivae normal, sclerae normal, normal eye lids NECK: supple, trachea midline, no neck  masses, no thyroid tenderness, no thyromegaly RESPIRATORY: breathing is even & unlabored, BS CTAB CARDIAC: RRR, no murmur,no extra heart sounds, no edema GI: abdomen soft, normal BS, no masses, no tenderness, no hepatomegaly, no splenomegaly PSYCHIATRIC: the patient is alert & oriented to person, affect & behavior appropriate  LABS/RADIOLOGY: 08/17/13 WBC 13.2 hemoglobin 9.5 hematocrit 29.5 08/14/13 hemoglobin A1c 6.8 WBC 16.5 hemoglobin 9.4 hematocrit 28.7 sodium 137 potassium 3.8 glucose 153 BUN 18 creatinine 0.7 calcium 9.4 08/10/13 WBC 14.7 hemoglobin 8 point hematocrit 22.7 sodium 126 potassium 4.2 BUN 15  creatinine 0.81 glucose 160 calcium 8.2   ASSESSMENT/PLAN:  Osteoarthritis status post left total knee arthroplasty - for Home health PT, OT and Nursing  Hypertension - well controlled; continue Imdur, Metoprolol and Catapres  Anemia, acute blood loss - stable  Diabetes mellitus, type II - well controlled; continue metformin  Gout - stable; continue allopurinol    I have filled out patient's discharge paperwork and written prescriptions.  Patient will receive home health PT, OT and Nursing.    Total discharge time: Less than 30 minutes Discharge time involved coordination of the discharge process with Child psychotherapist, nursing staff and therapy department. Medical justification for home health services verified.  CPT CODE: 44010

## 2013-09-05 ENCOUNTER — Emergency Department (HOSPITAL_COMMUNITY)
Admission: EM | Admit: 2013-09-05 | Discharge: 2013-09-05 | Disposition: A | Discharge status: Home / Self Care | Payer: Medicare Other | Attending: Emergency Medicine | Admitting: Emergency Medicine

## 2013-09-05 ENCOUNTER — Emergency Department (HOSPITAL_COMMUNITY): Payer: Medicare Other

## 2013-09-05 ENCOUNTER — Encounter (HOSPITAL_COMMUNITY): Payer: Self-pay | Admitting: Emergency Medicine

## 2013-09-05 ENCOUNTER — Telehealth: Payer: Self-pay | Admitting: *Deleted

## 2013-09-05 DIAGNOSIS — R6883 Chills (without fever): Secondary | ICD-10-CM | POA: Insufficient documentation

## 2013-09-05 DIAGNOSIS — M109 Gout, unspecified: Secondary | ICD-10-CM | POA: Insufficient documentation

## 2013-09-05 DIAGNOSIS — M79609 Pain in unspecified limb: Secondary | ICD-10-CM | POA: Insufficient documentation

## 2013-09-05 DIAGNOSIS — R531 Weakness: Secondary | ICD-10-CM

## 2013-09-05 DIAGNOSIS — I252 Old myocardial infarction: Secondary | ICD-10-CM | POA: Insufficient documentation

## 2013-09-05 DIAGNOSIS — Z8673 Personal history of transient ischemic attack (TIA), and cerebral infarction without residual deficits: Secondary | ICD-10-CM | POA: Insufficient documentation

## 2013-09-05 DIAGNOSIS — I10 Essential (primary) hypertension: Secondary | ICD-10-CM | POA: Insufficient documentation

## 2013-09-05 DIAGNOSIS — Z872 Personal history of diseases of the skin and subcutaneous tissue: Secondary | ICD-10-CM | POA: Insufficient documentation

## 2013-09-05 DIAGNOSIS — E876 Hypokalemia: Secondary | ICD-10-CM | POA: Insufficient documentation

## 2013-09-05 DIAGNOSIS — Z8709 Personal history of other diseases of the respiratory system: Secondary | ICD-10-CM | POA: Insufficient documentation

## 2013-09-05 DIAGNOSIS — I2510 Atherosclerotic heart disease of native coronary artery without angina pectoris: Secondary | ICD-10-CM | POA: Insufficient documentation

## 2013-09-05 DIAGNOSIS — R5381 Other malaise: Secondary | ICD-10-CM | POA: Insufficient documentation

## 2013-09-05 DIAGNOSIS — E119 Type 2 diabetes mellitus without complications: Secondary | ICD-10-CM | POA: Insufficient documentation

## 2013-09-05 DIAGNOSIS — Z8739 Personal history of other diseases of the musculoskeletal system and connective tissue: Secondary | ICD-10-CM

## 2013-09-05 DIAGNOSIS — M2550 Pain in unspecified joint: Secondary | ICD-10-CM

## 2013-09-05 DIAGNOSIS — Z79899 Other long term (current) drug therapy: Secondary | ICD-10-CM | POA: Insufficient documentation

## 2013-09-05 LAB — URINALYSIS, ROUTINE W REFLEX MICROSCOPIC
Bilirubin Urine: NEGATIVE
Glucose, UA: NEGATIVE mg/dL
Hgb urine dipstick: NEGATIVE
Ketones, ur: NEGATIVE mg/dL
Leukocytes, UA: NEGATIVE
Nitrite: NEGATIVE
Protein, ur: NEGATIVE mg/dL
Specific Gravity, Urine: 1.01 (ref 1.005–1.030)
Urobilinogen, UA: 0.2 mg/dL (ref 0.0–1.0)
pH: 6.5 (ref 5.0–8.0)

## 2013-09-05 LAB — BASIC METABOLIC PANEL
BUN: 11 mg/dL (ref 6–23)
CO2: 25 mEq/L (ref 19–32)
Calcium: 9.7 mg/dL (ref 8.4–10.5)
Chloride: 99 mEq/L (ref 96–112)
Creatinine, Ser: 0.75 mg/dL (ref 0.50–1.10)
GFR calc Af Amer: 90 mL/min — ABNORMAL LOW (ref >90)
GFR calc non Af Amer: 77 mL/min — ABNORMAL LOW (ref >90)
Glucose, Bld: 113 mg/dL — ABNORMAL HIGH (ref 70–99)
Potassium: 3.1 mEq/L — ABNORMAL LOW (ref 3.5–5.1)
Sodium: 133 mEq/L — ABNORMAL LOW (ref 135–145)

## 2013-09-05 LAB — CBC WITH DIFFERENTIAL/PLATELET
Basophils Absolute: 0 10*3/uL (ref 0.0–0.1)
Basophils Relative: 0 % (ref 0–1)
Eosinophils Absolute: 0.6 10*3/uL (ref 0.0–0.7)
Eosinophils Relative: 9 % — ABNORMAL HIGH (ref 0–5)
HCT: 28.7 % — ABNORMAL LOW (ref 36.0–46.0)
Hemoglobin: 9.8 g/dL — ABNORMAL LOW (ref 12.0–15.0)
Lymphocytes Relative: 32 % (ref 12–46)
Lymphs Abs: 2.2 10*3/uL (ref 0.7–4.0)
MCH: 30.9 pg (ref 26.0–34.0)
MCHC: 34.1 g/dL (ref 30.0–36.0)
MCV: 90.5 fL (ref 78.0–100.0)
Monocytes Absolute: 0.6 10*3/uL (ref 0.1–1.0)
Monocytes Relative: 8 % (ref 3–12)
Neutro Abs: 3.5 10*3/uL (ref 1.7–7.7)
Neutrophils Relative %: 51 % (ref 43–77)
Platelets: 289 10*3/uL (ref 150–400)
RBC: 3.17 MIL/uL — ABNORMAL LOW (ref 3.87–5.11)
RDW: 15.8 % — ABNORMAL HIGH (ref 11.5–15.5)
WBC: 6.9 10*3/uL (ref 4.0–10.5)

## 2013-09-05 MED ORDER — OXYCODONE-ACETAMINOPHEN 5-325 MG PO TABS
1.0000 | ORAL_TABLET | Freq: Once | ORAL | Status: AC
  Administered 2013-09-05: 1 via ORAL
  Filled 2013-09-05: qty 1

## 2013-09-05 MED ORDER — POTASSIUM CHLORIDE CRYS ER 20 MEQ PO TBCR
40.0000 meq | EXTENDED_RELEASE_TABLET | Freq: Once | ORAL | Status: AC
  Administered 2013-09-05: 40 meq via ORAL
  Filled 2013-09-05: qty 2

## 2013-09-05 NOTE — ED Notes (Addendum)
PER EMS- pt picked up from home with c/o chronic L arm and leg pain and neck pain since this morning.  Pt has hx of arthritis. Pt rates pain 7/10. Denies fall or trauma. Pt reports pain unrelieved by pain medication. Pt alert and oriented per baseline.

## 2013-09-05 NOTE — ED Notes (Signed)
Patient wheeled in wheelchair to restroom, walked a few steps and was able to get back into the bed with no difficulty

## 2013-09-05 NOTE — ED Notes (Signed)
Bed: RU04 Expected date:  Expected time:  Means of arrival:  Comments: 79 y/o neck pain

## 2013-09-05 NOTE — ED Provider Notes (Addendum)
CSN: [id number]     Arrival date & time 09/05/13  1118 History   First MD Initiated Contact with Patient 09/05/13 1209     Chief Complaint  Patient presents with  . Neck Pain  . Arm Pain  . Leg Pain   (Consider location/radiation/quality/duration/timing/severity/associated sxs/prior Treatment) Patient is a 79 y.o. female presenting with weakness. The history is provided by the patient. No language interpreter was used.  Weakness Associated symptoms include chills, myalgias, neck pain and weakness. Pertinent negatives include no abdominal pain, chest pain, coughing, headaches, nausea or rash. Associated symptoms comments: The patient lives at home with home health aid that comes every morning. This morning the aid called the patient's daughter with concern that she was confused and disoriented. Symptoms have improved now. No N, V, diarrhea, cough, chest or abdominal pain, or headache. She complains of chills and rigors without known fever. She has left sided joint pain and neck pain that is per her usual arthiritis pain treated with Percocet at home. .    Past Medical History  Diagnosis Date  . Hypertension   . First degree atrioventricular block   . Onychia and paronychia of toe   . Muscle weakness (generalized)   . Allergic rhinitis due to pollen   . Gout, unspecified   . Disorder of bone and cartilage, unspecified   . Edema   . Hyperlipidemia   . Unspecified essential hypertension   . Coronary atherosclerosis of native coronary artery   . Other malaise and fatigue   . Cough   . Other fall   . Unspecified vitamin D deficiency   . TIA (transient ischemic attack)     "a few one summer" (08/07/2013)  . Myocardial infarction ~ 1970  . Exertional shortness of breath     "sometimes" (08/07/2013)  . Type II diabetes mellitus     "fasting 90-110s" (08/07/2013)  . Osteoarthrosis, unspecified whether generalized or localized, unspecified site   . Prepatellar bursitis   . Arthritis    "in q joint" (08/07/2013)   Past Surgical History  Procedure Laterality Date  . Tonsillectomy  08/1969  . Replacement total knee Right 09/2005  . Shoulder open rotator cuff repair Right 07/1999  . Appendectomy  08/1948  . Abdominal hysterectomy  04/1979  . Transthoracic echocardiogram  2003    EF 55-65%; mild concentric LVH  . Joint replacement    . Total knee arthroplasty Left 08/07/2013  . Cataract extraction w/ intraocular lens  implant, bilateral Bilateral ~ 2012  . Total knee arthroplasty Left 08/07/2013    Procedure: TOTAL KNEE ARTHROPLASTY- LEFT;  Surgeon: Dannielle Huh, MD;  Location: [facility] OR;  Service: Orthopedics;  Laterality: Left;   Family History  Problem Relation Age of Onset  . Heart attack Father    History  Substance Use Topics  . Smoking status: Never Smoker   . Smokeless tobacco: Never Used  . Alcohol Use: No   OB History   Grav Para Term Preterm Abortions TAB SAB Ect Mult Living                 Review of Systems  Constitutional: Positive for chills.  Respiratory: Negative for cough and shortness of breath.   Cardiovascular: Negative for chest pain.  Gastrointestinal: Negative for nausea, abdominal pain and diarrhea.  Genitourinary: Negative for dysuria.  Musculoskeletal: Positive for myalgias and neck pain.  Skin: Negative for rash.  Neurological: Positive for weakness. Negative for headaches.    Allergies  Cetirizine hcl; Codeine;  Fish allergy; Indomethacin; Lyrica; Methyldopa; Shellfish allergy; and Tomato  Home Medications   Current Outpatient Rx  Name  Route  Sig  Dispense  Refill  . acetaminophen (TYLENOL) 500 MG tablet   Oral   Take 500 mg by mouth 2 (two) times daily as needed for pain (for breakthru pain (takes between vicodin doses)).         Marland Kitchen albuterol (PROVENTIL HFA;VENTOLIN HFA) 108 (90 BASE) MCG/ACT inhaler   Inhalation   Inhale 2 puffs into the lungs every 6 (six) hours as needed for wheezing.   1 Inhaler   0   . allopurinol  (ZYLOPRIM) 100 MG tablet   Oral   Take 100 mg by mouth daily. For gout         . Calcium-Magnesium-Vitamin D (CALCIUM 500 PO)   Oral   Take 1 tablet by mouth daily. Take one tablet once daily         . Cholecalciferol (VITAMIN D PO)   Oral   Take 1 tablet by mouth daily.          . cloNIDine (CATAPRES) 0.1 MG tablet   Oral   Take 1 tablet (0.1 mg total) by mouth daily.   30 tablet   5   . diclofenac sodium (VOLTAREN) 1 % GEL   Topical   Apply 2 g topically daily as needed (pain).         . hydrALAZINE (APRESOLINE) 25 MG tablet   Oral   Take 25 mg by mouth 2 (two) times daily.         . isosorbide mononitrate (IMDUR) 30 MG 24 hr tablet   Oral   Take 45 mg by mouth daily.         . metFORMIN (GLUCOPHAGE) 500 MG tablet   Oral   Take 500 mg by mouth daily with breakfast. For diabetes         . methocarbamol (ROBAXIN) 500 MG tablet   Oral   Take 500-1,000 mg by mouth every 6 (six) hours as needed (muscle spasm).         . metoprolol succinate (TOPROL-XL) 100 MG 24 hr tablet   Oral   Take 100 mg by mouth daily. Take with or immediately following a meal.         . oxyCODONE (OXY IR/ROXICODONE) 5 MG immediate release tablet   Oral   Take 5-10 mg by mouth every 4 (four) hours as needed for pain.         . polyvinyl alcohol (LIQUIFILM TEARS) 1.4 % ophthalmic solution   Both Eyes   Place 1 drop into both eyes as needed (for dry eyes).         . valsartan-hydrochlorothiazide (DIOVAN-HCT) 160-25 MG per tablet   Oral   Take 1 tablet by mouth daily. For blood pressure         . verapamil (VERELAN PM) 240 MG 24 hr capsule   Oral   Take 240 mg by mouth daily.         Marland Kitchen EXPIRED: enoxaparin (LOVENOX) 40 MG/0.4ML injection   Subcutaneous   Inject 0.4 mLs (40 mg total) into the skin daily.   11 Syringe   0   . furosemide (LASIX) 20 MG tablet   Oral   Take 20 mg by mouth daily. 1 by mouth daily          BP 138/59  Pulse 82  Temp(Src) 98.3  F (36.8 C) (Oral)  Resp 14  SpO2 100% Physical Exam  Constitutional: She is oriented to person, place, and time. She appears well-developed and well-nourished.  HENT:  Head: Normocephalic.  Mouth/Throat: Oropharynx is clear and moist.  Neck: Normal range of motion. Neck supple.  Cardiovascular: Normal rate and regular rhythm.   Pulmonary/Chest: Effort normal and breath sounds normal. She has no wheezes. She has no rales.  Abdominal: Soft. Bowel sounds are normal. There is no tenderness. There is no rebound and no guarding.  Musculoskeletal: Normal range of motion.  Neurological: She is alert and oriented to person, place, and time.  Skin: Skin is warm and dry. No rash noted.  Psychiatric: She has a normal mood and affect.    ED Course  Procedures (including critical care time) Labs Review Labs Reviewed  URINE CULTURE  URINALYSIS, ROUTINE W REFLEX MICROSCOPIC  CBC WITH DIFFERENTIAL  BASIC METABOLIC PANEL   Results for orders placed during the hospital encounter of 09/05/13  URINALYSIS, ROUTINE W REFLEX MICROSCOPIC      Result Value Range   Color, Urine YELLOW  YELLOW   APPearance CLEAR  CLEAR   Specific Gravity, Urine 1.010  1.005 - 1.030   pH 6.5  5.0 - 8.0   Glucose, UA NEGATIVE  NEGATIVE mg/dL   Hgb urine dipstick NEGATIVE  NEGATIVE   Bilirubin Urine NEGATIVE  NEGATIVE   Ketones, ur NEGATIVE  NEGATIVE mg/dL   Protein, ur NEGATIVE  NEGATIVE mg/dL   Urobilinogen, UA 0.2  0.0 - 1.0 mg/dL   Nitrite NEGATIVE  NEGATIVE   Leukocytes, UA NEGATIVE  NEGATIVE  CBC WITH DIFFERENTIAL      Result Value Range   WBC 6.9  4.0 - 10.5 K/uL   RBC 3.17 (*) 3.87 - 5.11 MIL/uL   Hemoglobin 9.8 (*) 12.0 - 15.0 g/dL   HCT 65.7 (*) 84.6 - 96.2 %   MCV 90.5  78.0 - 100.0 fL   MCH 30.9  26.0 - 34.0 pg   MCHC 34.1  30.0 - 36.0 g/dL   RDW 95.2 (*) 84.1 - 32.4 %   Platelets 289  150 - 400 K/uL   Neutrophils Relative % 51  43 - 77 %   Neutro Abs 3.5  1.7 - 7.7 K/uL   Lymphocytes Relative  32  12 - 46 %   Lymphs Abs 2.2  0.7 - 4.0 K/uL   Monocytes Relative 8  3 - 12 %   Monocytes Absolute 0.6  0.1 - 1.0 K/uL   Eosinophils Relative 9 (*) 0 - 5 %   Eosinophils Absolute 0.6  0.0 - 0.7 K/uL   Basophils Relative 0  0 - 1 %   Basophils Absolute 0.0  0.0 - 0.1 K/uL  BASIC METABOLIC PANEL      Result Value Range   Sodium 133 (*) 135 - 145 mEq/L   Potassium 3.1 (*) 3.5 - 5.1 mEq/L   Chloride 99  96 - 112 mEq/L   CO2 25  19 - 32 mEq/L   Glucose, Bld 113 (*) 70 - 99 mg/dL   BUN 11  6 - 23 mg/dL   Creatinine, Ser 4.01  0.50 - 1.10 mg/dL   Calcium 9.7  8.4 - 02.7 mg/dL   GFR calc non Af Amer 77 (*) >90 mL/min   GFR calc Af Amer 90 (*) >90 mL/min   Dg Chest Portable 1 View  09/05/2013   CLINICAL DATA:  Chest pain, weakness and confusion.  EXAM: PORTABLE CHEST - 1 VIEW  COMPARISON:  07/28/2013.  FINDINGS: There is bilateral diffuse interstitial thickening. There is no pleural effusion or pneumothorax. There is prominence of the central pulmonary vasculature. The heart size is enlarged. There are severe degenerative changes of bilateral glenohumeral joints.  IMPRESSION: Mild pulmonary edema.   Electronically Signed   By: Elige Ko   On: 09/05/2013 13:43   Imaging Review No results found.  EKG Interpretation   None       MDM  No diagnosis found. 1. Weakness 2. Arthralgias 3. History of osteoarthritis  No change in condition in ED. No further confusion, she remains alert and oriented. Lab studies are unremarkable. Mild hypokalemia treated in ED. Stable for discharge.      Arnoldo Hooker, PA-C 09/05/13 1617  Arnoldo Hooker, PA-C 09/05/13 1619  Arnoldo Hooker, PA-C 09/21/13 [id number]  Arnoldo Hooker, PA-C 09/21/13 [phone]

## 2013-09-05 NOTE — Telephone Encounter (Signed)
Patient had knee replacement surgery and now really confused and daughter states that is not like her. States that she has swelling in her hands. I advised daughter to take her mother to the ER or Urgent Care Center. Daughter agreed.

## 2013-09-05 NOTE — ED Notes (Signed)
Patient is aware that we need a urine specimen, patient given water to drink, will check again in 10 minutes.

## 2013-09-05 NOTE — ED Notes (Signed)
PA @ bedside.

## 2013-09-05 NOTE — ED Notes (Signed)
I stuck patient x2 for blood, unsuccessful x2. Phlebotomist [place] notified as well as RN [place]

## 2013-09-06 ENCOUNTER — Ambulatory Visit: Payer: Medicare Other | Attending: Orthopedic Surgery | Admitting: Physical Therapy

## 2013-09-06 DIAGNOSIS — M25669 Stiffness of unspecified knee, not elsewhere classified: Secondary | ICD-10-CM | POA: Insufficient documentation

## 2013-09-06 DIAGNOSIS — M25569 Pain in unspecified knee: Secondary | ICD-10-CM | POA: Insufficient documentation

## 2013-09-06 DIAGNOSIS — R609 Edema, unspecified: Secondary | ICD-10-CM | POA: Insufficient documentation

## 2013-09-06 DIAGNOSIS — IMO0001 Reserved for inherently not codable concepts without codable children: Secondary | ICD-10-CM | POA: Insufficient documentation

## 2013-09-06 DIAGNOSIS — R262 Difficulty in walking, not elsewhere classified: Secondary | ICD-10-CM | POA: Insufficient documentation

## 2013-09-06 DIAGNOSIS — Z96659 Presence of unspecified artificial knee joint: Secondary | ICD-10-CM | POA: Insufficient documentation

## 2013-09-06 LAB — URINE CULTURE
Colony Count: NO GROWTH
Culture: NO GROWTH

## 2013-09-08 ENCOUNTER — Ambulatory Visit: Payer: Medicare Other | Admitting: Physical Therapy

## 2013-09-11 ENCOUNTER — Ambulatory Visit: Payer: Medicare Other | Admitting: Physical Therapy

## 2013-09-12 ENCOUNTER — Ambulatory Visit (INDEPENDENT_AMBULATORY_CARE_PROVIDER_SITE_OTHER): Payer: Medicare Other | Admitting: Internal Medicine

## 2013-09-12 ENCOUNTER — Encounter: Payer: Self-pay | Admitting: Internal Medicine

## 2013-09-12 VITALS — BP 128/70 | HR 78 | Temp 98.4°F | Resp 16 | Wt 196.8 lb

## 2013-09-12 DIAGNOSIS — Z23 Encounter for immunization: Secondary | ICD-10-CM

## 2013-09-12 DIAGNOSIS — I10 Essential (primary) hypertension: Secondary | ICD-10-CM

## 2013-09-12 DIAGNOSIS — R6889 Other general symptoms and signs: Secondary | ICD-10-CM

## 2013-09-12 DIAGNOSIS — M109 Gout, unspecified: Secondary | ICD-10-CM

## 2013-09-12 DIAGNOSIS — J45909 Unspecified asthma, uncomplicated: Secondary | ICD-10-CM

## 2013-09-12 DIAGNOSIS — R609 Edema, unspecified: Secondary | ICD-10-CM

## 2013-09-12 DIAGNOSIS — E119 Type 2 diabetes mellitus without complications: Secondary | ICD-10-CM

## 2013-09-12 DIAGNOSIS — M1990 Unspecified osteoarthritis, unspecified site: Secondary | ICD-10-CM

## 2013-09-12 DIAGNOSIS — D649 Anemia, unspecified: Secondary | ICD-10-CM

## 2013-09-12 DIAGNOSIS — E871 Hypo-osmolality and hyponatremia: Secondary | ICD-10-CM

## 2013-09-12 MED ORDER — FLUTICASONE-SALMETEROL 100-50 MCG/DOSE IN AEPB: 1.0000 | INHALATION_SPRAY | Freq: Two times a day (BID) | RESPIRATORY_TRACT | Status: DC

## 2013-09-12 NOTE — Progress Notes (Signed)
Patient ID: [name] [name], female   DOB: 12/05/1929, 79 y.o.   MRN: [id number]  Chief Complaint  Patient presents with  . Follow-up    hospital f/u  . temperature intolerance   Allergies  Allergen Reactions  . Cetirizine Hcl Other (See Comments)    unknown  . Codeine Other (See Comments)    unknown  . Fish Allergy   . Indomethacin Other (See Comments)    unknown  . Lyrica [Pregabalin]     hallucinations  . Methyldopa Other (See Comments)    unknown  . Shellfish Allergy Hives  . Tomato Rash   HPI 79 y/o female patient is here for post rehab follow up. She had left knee TKA and has tolerated the procedure well. She was on prednsione prior to the surgery and was continued on it post procedure in rehab and discharged on it. Patient did not pick up her scripts and has not taken her steroid for more than 3 weeks now. She also has not taken her lasix for the same period because her swelling had subsided. She has not taken her advair either Recently on 09/05/13 she had episode of severe cold intolerance and was disoriented and taken to the ED. In the ED she had returned to her baseline and her routine labs were at her baseline. Her potassium level was repleted and she was sent home She has not had similar episode again but continues to feel cold. Denies any chest pain or dyspnea No further episodes of disorientation  Review of Systems  Constitutional: Negative for fever, chills and appetite change.  HENT: Negative for congestion.   Respiratory: Negative for choking and shortness of breath.   Cardiovascular: Negative for chest pain, palpitations and leg swelling.  Gastrointestinal: Negative for abdominal pain, diarrhea and constipation.  Genitourinary: Negative for dysuria.  Musculoskeletal: Positive for arthralgias.  Neurological: Negative for dizziness and light-headedness.  Psychiatric/Behavioral: Negative for behavioral problems and agitation.   Past Medical History  Diagnosis  Date  . Hypertension   . First degree atrioventricular block   . Onychia and paronychia of toe   . Muscle weakness (generalized)   . Allergic rhinitis due to pollen   . Gout, unspecified   . Disorder of bone and cartilage, unspecified   . Edema   . Hyperlipidemia   . Unspecified essential hypertension   . Coronary atherosclerosis of native coronary artery   . Other malaise and fatigue   . Cough   . Other fall   . Unspecified vitamin D deficiency   . TIA (transient ischemic attack)     "a few one summer" (08/07/2013)  . Myocardial infarction ~ 1970  . Exertional shortness of breath     "sometimes" (08/07/2013)  . Type II diabetes mellitus     "fasting 90-110s" (08/07/2013)  . Osteoarthrosis, unspecified whether generalized or localized, unspecified site   . Prepatellar bursitis   . Arthritis     "in q joint" (08/07/2013)   Past Surgical History  Procedure Laterality Date  . Tonsillectomy  08/1969  . Replacement total knee Right 09/2005  . Shoulder open rotator cuff repair Right 07/1999  . Appendectomy  08/1948  . Abdominal hysterectomy  04/1979  . Transthoracic echocardiogram  2003    EF 55-65%; mild concentric LVH  . Joint replacement    . Total knee arthroplasty Left 08/07/2013  . Cataract extraction w/ intraocular lens  implant, bilateral Bilateral ~ 2012  . Total knee arthroplasty Left 08/07/2013  Procedure: TOTAL KNEE ARTHROPLASTY- LEFT;  Surgeon: Dannielle Huh, MD;  Location: [facility] OR;  Service: Orthopedics;  Laterality: Left;   Current Outpatient Prescriptions on File Prior to Visit  Medication Sig Dispense Refill  . acetaminophen (TYLENOL) 500 MG tablet Take 500 mg by mouth 2 (two) times daily as needed for pain (for breakthru pain (takes between vicodin doses)).      Marland Kitchen albuterol (PROVENTIL HFA;VENTOLIN HFA) 108 (90 BASE) MCG/ACT inhaler Inhale 2 puffs into the lungs every 6 (six) hours as needed for wheezing.  1 Inhaler  0  . allopurinol (ZYLOPRIM) 100 MG tablet Take 100  mg by mouth daily. For gout      . Calcium-Magnesium-Vitamin D (CALCIUM 500 PO) Take 1 tablet by mouth daily. Take one tablet once daily      . Cholecalciferol (VITAMIN D PO) Take 1 tablet by mouth daily.       . cloNIDine (CATAPRES) 0.1 MG tablet Take 1 tablet (0.1 mg total) by mouth daily.  30 tablet  5  . diclofenac sodium (VOLTAREN) 1 % GEL Apply 2 g topically daily as needed (pain).      . hydrALAZINE (APRESOLINE) 25 MG tablet Take 25 mg by mouth 2 (two) times daily.      . isosorbide mononitrate (IMDUR) 30 MG 24 hr tablet Take 45 mg by mouth daily.      . metFORMIN (GLUCOPHAGE) 500 MG tablet Take 500 mg by mouth daily with breakfast. For diabetes      . methocarbamol (ROBAXIN) 500 MG tablet Take 500-1,000 mg by mouth every 6 (six) hours as needed (muscle spasm).      . metoprolol succinate (TOPROL-XL) 100 MG 24 hr tablet Take 100 mg by mouth daily. Take with or immediately following a meal.      . oxyCODONE (OXY IR/ROXICODONE) 5 MG immediate release tablet Take 5-10 mg by mouth every 4 (four) hours as needed for pain.      . polyvinyl alcohol (LIQUIFILM TEARS) 1.4 % ophthalmic solution Place 1 drop into both eyes as needed (for dry eyes).      . valsartan-hydrochlorothiazide (DIOVAN-HCT) 160-25 MG per tablet Take 1 tablet by mouth daily. For blood pressure      . verapamil (VERELAN PM) 240 MG 24 hr capsule Take 240 mg by mouth daily.      Marland Kitchen enoxaparin (LOVENOX) 40 MG/0.4ML injection Inject 0.4 mLs (40 mg total) into the skin daily.  11 Syringe  0   No current facility-administered medications on file prior to visit.   Physical exam BP 128/70  Pulse 78  Temp(Src) 98.4 F (36.9 C) (Oral)  Resp 16  Wt 196 lb 12.8 oz (89.268 kg)  SpO2 98%  Constitutional: She is oriented to person, place, and time. No distress.  obese  HENT:  Head: Normocephalic and atraumatic.   Nose: Nose normal.   Mouth/Throat: Oropharynx is clear and moist.   Eyes: Conjunctivae are normal. Pupils are equal,  round, and reactive to light.   Neck: Normal range of motion. Neck supple. No JVD present. No tracheal deviation present.   Cardiovascular: Normal rate, regular rhythm and normal heart sounds.    No murmur heard. Pulmonary/Chest: Effort normal and breath sounds normal.   Abdominal: Soft. Bowel sounds are normal. She exhibits no distension. There is no tenderness.  Musculoskeletal: Normal range of motion. She exhibits no edema.  Lymphadenopathy:    She has no cervical adenopathy.  Neurological: She is alert and oriented to person, place, and  time. She has normal reflexes.   Skin: Skin is warm and dry. She is not diaphoretic.  Psychiatric: She has a normal mood and affect. Her behavior is normal.   Labs- CBC    Component Value Date/Time   WBC 6.9 09/05/2013 1445   WBC 11.0* 07/24/2013 1701   RBC 3.17* 09/05/2013 1445   RBC 3.72* 07/24/2013 1701   HGB 9.8* 09/05/2013 1445   HCT 28.7* 09/05/2013 1445   PLT 289 09/05/2013 1445   MCV 90.5 09/05/2013 1445   MCH 30.9 09/05/2013 1445   MCH 31.2 07/24/2013 1701   MCHC 34.1 09/05/2013 1445   MCHC 34.5 07/24/2013 1701   RDW 15.8* 09/05/2013 1445   RDW 16.3* 07/24/2013 1701   LYMPHSABS 2.2 09/05/2013 1445   LYMPHSABS 2.8 07/24/2013 1701   MONOABS 0.6 09/05/2013 1445   EOSABS 0.6 09/05/2013 1445   EOSABS 0.1 07/24/2013 1701   BASOSABS 0.0 09/05/2013 1445   BASOSABS 0.0 07/24/2013 1701    CMP     Component Value Date/Time   NA 133* 09/05/2013 1445   K 3.1* 09/05/2013 1445   CL 99 09/05/2013 1445   CO2 25 09/05/2013 1445   GLUCOSE 113* 09/05/2013 1445   BUN 11 09/05/2013 1445   CREATININE 0.75 09/05/2013 1445   CALCIUM 9.7 09/05/2013 1445   PROT 7.4 07/28/2013 1356   ALBUMIN 3.4* 07/28/2013 1356   AST 15 07/28/2013 1356   ALT 13 07/28/2013 1356   ALKPHOS 58 07/28/2013 1356   BILITOT 0.2* 07/28/2013 1356   GFRNONAA 77* 09/05/2013 1445   GFRAA 90* 09/05/2013 1445   a1c 7.1 in 9/14  Assessment/plan-  Cold intolerance- concerns of low  cortisol level with sudden stopping of deltasone after 8 weeks of taking it. Will check serum cortisol level. Will also rule out hypothyroidism. She has hx of anemia and will rule out worsening anemia by checking ferritin level and cbc  Edema- minimal, has not taken lasix of almost a month. Will not continue this for now and monitor clinically. Weight is stable  Gout- no recent gout flare. Continue allopurinol  Hypertension- mildly elevated SBP but overall normal bp readings. Continue current regimen  Dm type 2- monitor cbg readings. Continue metformin and will recheck a1c prior to next visit  Arthritis- pain under control with her current oxycodone, continue this  Reactive airway disease- continue prn albuterol. Will also have her take her advair which she is not taking at present  Hyponatremia- this could have contributed to her disorientation in the ED. Will recheck bmp. If is persistently low, consider reviewing her medication and possible addition of salt tablets

## 2013-09-13 ENCOUNTER — Ambulatory Visit: Payer: Medicare Other | Attending: Orthopedic Surgery | Admitting: Physical Therapy

## 2013-09-13 DIAGNOSIS — R609 Edema, unspecified: Secondary | ICD-10-CM | POA: Insufficient documentation

## 2013-09-13 DIAGNOSIS — M25569 Pain in unspecified knee: Secondary | ICD-10-CM | POA: Insufficient documentation

## 2013-09-13 DIAGNOSIS — Z96659 Presence of unspecified artificial knee joint: Secondary | ICD-10-CM | POA: Insufficient documentation

## 2013-09-13 DIAGNOSIS — M1710 Unilateral primary osteoarthritis, unspecified knee: Secondary | ICD-10-CM | POA: Insufficient documentation

## 2013-09-13 DIAGNOSIS — M25669 Stiffness of unspecified knee, not elsewhere classified: Secondary | ICD-10-CM | POA: Insufficient documentation

## 2013-09-13 DIAGNOSIS — IMO0001 Reserved for inherently not codable concepts without codable children: Secondary | ICD-10-CM | POA: Insufficient documentation

## 2013-09-13 DIAGNOSIS — R262 Difficulty in walking, not elsewhere classified: Secondary | ICD-10-CM | POA: Insufficient documentation

## 2013-09-13 NOTE — Progress Notes (Signed)
Patient ID: [name] [name], female   DOB: [birth year], 79 y.o.   MRN: [id number]        HISTORY & PHYSICAL  DATE: 08/16/2013   FACILITY: [facility]  LEVEL OF CARE: SNF (31)  ALLERGIES:  Allergies  Allergen Reactions  . Cetirizine Hcl Other (See Comments)    unknown  . Codeine Other (See Comments)    unknown  . Fish Allergy   . Indomethacin Other (See Comments)    unknown  . Lyrica [Pregabalin]     hallucinations  . Methyldopa Other (See Comments)    unknown  . Shellfish Allergy Hives  . Tomato Rash    CHIEF COMPLAINT:  Manage left knee osteoarthritis, diabetes mellitus, and acute blood loss anemia.    HISTORY OF PRESENT ILLNESS:  The patient is an 79 year-old, African-American female.    KNEE OSTEOARTHRITIS: Patient had a history of pain and functional disability in the knee due to end-stage osteoarthritis and has failed nonsurgical conservative treatments. Patient had worsening of pain with activity and weight bearing, pain that interfered with activities of daily living & pain with passive range of motion. Therefore patient underwent total knee arthroplasty and tolerated the procedure well. Patient is admitted to this facility for sort short-term rehabilitation. Patient denies knee pain.   DM:pt's DM remains stable.  Pt denies polyuria, polydipsia, polyphagia, changes in vision or hypoglycemic episodes.  No complications noted from the medication presently being used.  Last hemoglobin A1c is:  Not available.    ANEMIA: The anemia has been stable. The patient denies fatigue, melena or hematochezia. No complications from the medications currently being used.   Last hemoglobins are:  8.2, 8.5, 10.5.   The patient is currently on iron.    PAST MEDICAL HISTORY :  Past Medical History  Diagnosis Date  . Hypertension   . First degree atrioventricular block   . Onychia and paronychia of toe   . Muscle weakness (generalized)   . Allergic rhinitis due to pollen    . Gout, unspecified   . Disorder of bone and cartilage, unspecified   . Edema   . Hyperlipidemia   . Unspecified essential hypertension   . Coronary atherosclerosis of native coronary artery   . Other malaise and fatigue   . Cough   . Other fall   . Unspecified vitamin D deficiency   . TIA (transient ischemic attack)     "a few one summer" (08/07/2013)  . Myocardial infarction ~ 1970  . Exertional shortness of breath     "sometimes" (08/07/2013)  . Type II diabetes mellitus     "fasting 90-110s" (08/07/2013)  . Osteoarthrosis, unspecified whether generalized or localized, unspecified site   . Prepatellar bursitis   . Arthritis     "in q joint" (08/07/2013)    PAST SURGICAL HISTORY: Past Surgical History  Procedure Laterality Date  . Tonsillectomy  08/1969  . Replacement total knee Right 09/2005  . Shoulder open rotator cuff repair Right 07/1999  . Appendectomy  08/1948  . Abdominal hysterectomy  04/1979  . Transthoracic echocardiogram  2003    EF 55-65%; mild concentric LVH  . Joint replacement    . Total knee arthroplasty Left 08/07/2013  . Cataract extraction w/ intraocular lens  implant, bilateral Bilateral ~ 2012  . Total knee arthroplasty Left 08/07/2013    Procedure: TOTAL KNEE ARTHROPLASTY- LEFT;  Surgeon: Dannielle Huh, MD;  Location: [facility] OR;  Service: Orthopedics;  Laterality: Left;  SOCIAL HISTORY:  reports that she has never smoked. She has never used smokeless tobacco. She reports that she does not drink alcohol or use illicit drugs.  FAMILY HISTORY:  Family History  Problem Relation Age of Onset  . Heart attack Father     CURRENT MEDICATIONS: Reviewed per [facility]  REVIEW OF SYSTEMS:  See HPI otherwise 14 point ROS is negative.  PHYSICAL EXAMINATION  VS:  T 98.5       P 98      RR 20      BP 132/88      POX 99%        WT (Lb)  GENERAL: no acute distress, normal body habitus EYES: conjunctivae normal, sclerae normal, normal eye lids MOUTH/THROAT: lips  without lesions,no lesions in the mouth,tongue is without lesions,uvula elevates in midline NECK: supple, trachea midline, no neck masses, no thyroid tenderness, no thyromegaly LYMPHATICS: no LAN in the neck, no supraclavicular LAN RESPIRATORY: breathing is even & unlabored, BS CTAB CARDIAC: RRR, no murmur,no extra heart sounds   EDEMA/VARICOSITIES:  +1 bilateral lower extremity edema  ARTERIAL:  pedal pulses diminished   GI:  ABDOMEN: abdomen soft, normal BS, no masses, no tenderness  LIVER/SPLEEN: no hepatomegaly, no splenomegaly MUSCULOSKELETAL: HEAD: normal to inspection & palpation BACK: no kyphosis, scoliosis or spinal processes tenderness EXTREMITIES: LEFT UPPER EXTREMITY: full range of motion, normal strength & tone RIGHT UPPER EXTREMITY:  full range of motion, normal strength & tone LEFT LOWER EXTREMITY: strength intact, range of motion not tested due to surgery   RIGHT LOWER EXTREMITY: strength intact, range of motion moderate   PSYCHIATRIC: the patient is alert & oriented to person, affect & behavior appropriate  LABS/RADIOLOGY: Hemoglobin 9.4, MCV 93.8, WBC 15.5, platelets 564.    At hospital discharge:  WBC 14.7, hemoglobin 8.2, platelets 282.    On 08/14/2013:  Glucose 153, otherwise BMP normal.    Chest x-ray:  No acute disease.    ASSESSMENT/PLAN:  Left knee osteoarthritis.  Status post left total knee arthroplasty.  Continue rehabilitation.    Diabetes mellitus.  Continue current medications.    Acute blood loss anemia.  Reassess hemoglobin level.    Leukocytosis.  Reassess.    Hypertension.  Well controlled.    Coronary artery disease.  Stable.    I have reviewed patient's medical records received at admission/from hospitalization.  CPT CODE: 65784

## 2013-09-14 ENCOUNTER — Ambulatory Visit: Payer: Medicare Other | Admitting: Physical Therapy

## 2013-09-14 LAB — CBC WITH DIFFERENTIAL/PLATELET
Basophils Absolute: 0 10*3/uL (ref 0.0–0.2)
Basos: 0 %
Eos: 8 %
Eosinophils Absolute: 0.6 10*3/uL — ABNORMAL HIGH (ref 0.0–0.4)
HCT: 31.3 % — ABNORMAL LOW (ref 34.0–46.6)
Hemoglobin: 10 g/dL — ABNORMAL LOW (ref 11.1–15.9)
Immature Grans (Abs): 0 10*3/uL (ref 0.0–0.1)
Immature Granulocytes: 0 %
Lymphocytes Absolute: 1.8 10*3/uL (ref 0.7–3.1)
Lymphs: 25 %
MCH: 29.7 pg (ref 26.6–33.0)
MCHC: 31.9 g/dL (ref 31.5–35.7)
MCV: 93 fL (ref 79–97)
Monocytes Absolute: 0.5 10*3/uL (ref 0.1–0.9)
Monocytes: 7 %
Neutrophils Absolute: 4.3 10*3/uL (ref 1.4–7.0)
Neutrophils Relative %: 60 %
RBC: 3.37 x10E6/uL — ABNORMAL LOW (ref 3.77–5.28)
RDW: 16 % — ABNORMAL HIGH (ref 12.3–15.4)
WBC: 7.2 10*3/uL (ref 3.4–10.8)

## 2013-09-14 LAB — COMPREHENSIVE METABOLIC PANEL
ALT: 7 IU/L (ref 0–32)
AST: 14 IU/L (ref 0–40)
Albumin/Globulin Ratio: 1 — ABNORMAL LOW (ref 1.1–2.5)
Albumin: 3.5 g/dL (ref 3.5–4.7)
Alkaline Phosphatase: 89 IU/L (ref 39–117)
BUN/Creatinine Ratio: 11 (ref 11–26)
BUN: 9 mg/dL (ref 8–27)
CO2: 24 mmol/L (ref 18–29)
Calcium: 9.6 mg/dL (ref 8.6–10.2)
Chloride: 98 mmol/L (ref 97–108)
Creatinine, Ser: 0.79 mg/dL (ref 0.57–1.00)
GFR calc Af Amer: 81 mL/min/{1.73_m2} (ref >59)
GFR calc non Af Amer: 70 mL/min/{1.73_m2} (ref >59)
Globulin, Total: 3.4 g/dL (ref 1.5–4.5)
Glucose: 138 mg/dL — ABNORMAL HIGH (ref 65–99)
Potassium: 3.9 mmol/L (ref 3.5–5.2)
Sodium: 138 mmol/L (ref 134–144)
Total Bilirubin: 0.3 mg/dL (ref 0.0–1.2)
Total Protein: 6.9 g/dL (ref 6.0–8.5)

## 2013-09-14 LAB — FERRITIN: Ferritin: 297 ng/mL — ABNORMAL HIGH (ref 15–150)

## 2013-09-14 LAB — CORTISOL: Cortisol: 11.7 ug/dL (ref 2.3–19.4)

## 2013-09-14 LAB — T3, FREE: T3, Free: 2.3 pg/mL (ref 2.0–4.4)

## 2013-09-14 LAB — TSH: TSH: 1.66 u[IU]/mL (ref 0.450–4.500)

## 2013-09-14 LAB — T4: T4, Total: 6.2 ug/dL (ref 4.5–12.0)

## 2013-09-14 NOTE — ED Provider Notes (Signed)
79 year old female with a brief period of confusion. Result. Daughters at bedside reports the patient is at her baseline. [name] only complaint is of chills. Workup has been pretty unremarkable. Low suspicion for emergent process. Discharge at return precautions. Outpatient followup.  Medical screening examination/treatment/procedure(s) were conducted as a shared visit with non-physician practitioner(s) and myself.  I personally evaluated the patient during the encounter.  EKG Interpretation   None        Raeford Razor, MD 09/14/13 [phone]

## 2013-09-15 ENCOUNTER — Telehealth: Payer: Self-pay | Admitting: *Deleted

## 2013-09-15 ENCOUNTER — Other Ambulatory Visit: Payer: Self-pay | Admitting: *Deleted

## 2013-09-15 MED ORDER — PREDNISONE 10 MG PO TABS: ORAL_TABLET | ORAL | Status: DC

## 2013-09-15 NOTE — Telephone Encounter (Signed)
Patient is still having severe symptoms of being cold and joints painful. Cannot get warm. Wants to know if she should go back on Prednisone. Not doing well with the coldness and swelling. Please Advise.

## 2013-09-15 NOTE — Telephone Encounter (Signed)
Patient daughter notified and faxed Rx to [facility]

## 2013-09-15 NOTE — Telephone Encounter (Signed)
Lets start her back on prednsione 10 mg po daily for now until I see her in the office next. Will need to let us know if has no improvement with this

## 2013-09-19 ENCOUNTER — Ambulatory Visit: Payer: Medicare Other | Admitting: Physical Therapy

## 2013-09-21 NOTE — ED Provider Notes (Signed)
Medical screening examination/treatment/procedure(s) were conducted as a shared visit with non-physician practitioner(s) and myself.  I personally evaluated the patient during the encounter.  EKG Interpretation   None      See other note.   Raeford Razor, MD 09/21/13 1003

## 2013-09-22 ENCOUNTER — Ambulatory Visit: Payer: Medicare Other | Admitting: Physical Therapy

## 2013-09-26 ENCOUNTER — Ambulatory Visit: Payer: Medicare Other | Admitting: Physical Therapy

## 2013-09-27 ENCOUNTER — Other Ambulatory Visit: Payer: Self-pay | Admitting: Nurse Practitioner

## 2013-09-27 ENCOUNTER — Ambulatory Visit: Payer: Medicare Other | Admitting: Physical Therapy

## 2013-09-29 ENCOUNTER — Ambulatory Visit: Payer: Medicare Other | Admitting: Physical Therapy

## 2013-10-02 ENCOUNTER — Ambulatory Visit: Payer: Medicare Other | Admitting: Physical Therapy

## 2013-10-10 ENCOUNTER — Ambulatory Visit: Payer: Medicare Other | Attending: Orthopedic Surgery | Admitting: Physical Therapy

## 2013-10-10 DIAGNOSIS — Z96659 Presence of unspecified artificial knee joint: Secondary | ICD-10-CM | POA: Insufficient documentation

## 2013-10-10 DIAGNOSIS — M25569 Pain in unspecified knee: Secondary | ICD-10-CM | POA: Insufficient documentation

## 2013-10-10 DIAGNOSIS — R262 Difficulty in walking, not elsewhere classified: Secondary | ICD-10-CM | POA: Insufficient documentation

## 2013-10-10 DIAGNOSIS — IMO0001 Reserved for inherently not codable concepts without codable children: Secondary | ICD-10-CM | POA: Insufficient documentation

## 2013-10-10 DIAGNOSIS — M25669 Stiffness of unspecified knee, not elsewhere classified: Secondary | ICD-10-CM | POA: Insufficient documentation

## 2013-10-10 DIAGNOSIS — R609 Edema, unspecified: Secondary | ICD-10-CM | POA: Insufficient documentation

## 2013-10-13 ENCOUNTER — Ambulatory Visit: Payer: Medicare Other | Admitting: Physical Therapy

## 2013-10-16 ENCOUNTER — Other Ambulatory Visit: Payer: Self-pay | Admitting: *Deleted

## 2013-10-16 ENCOUNTER — Encounter: Payer: Self-pay | Admitting: *Deleted

## 2013-10-16 MED ORDER — PREDNISONE 10 MG PO TABS: ORAL_TABLET | ORAL | Status: DC

## 2013-10-17 ENCOUNTER — Ambulatory Visit: Payer: Medicare Other | Admitting: Physical Therapy

## 2013-10-18 ENCOUNTER — Encounter: Payer: Self-pay | Admitting: Internal Medicine

## 2013-10-18 ENCOUNTER — Ambulatory Visit (INDEPENDENT_AMBULATORY_CARE_PROVIDER_SITE_OTHER): Payer: Medicare Other | Admitting: Internal Medicine

## 2013-10-18 VITALS — BP 134/82 | HR 82 | Resp 12 | Wt 194.0 lb

## 2013-10-18 DIAGNOSIS — I10 Essential (primary) hypertension: Secondary | ICD-10-CM

## 2013-10-18 DIAGNOSIS — E119 Type 2 diabetes mellitus without complications: Secondary | ICD-10-CM

## 2013-10-18 DIAGNOSIS — M1990 Unspecified osteoarthritis, unspecified site: Secondary | ICD-10-CM

## 2013-10-18 DIAGNOSIS — I2510 Atherosclerotic heart disease of native coronary artery without angina pectoris: Secondary | ICD-10-CM

## 2013-10-18 DIAGNOSIS — M109 Gout, unspecified: Secondary | ICD-10-CM

## 2013-10-18 MED ORDER — OXYCODONE HCL 5 MG PO TABS: 5.0000 mg | ORAL_TABLET | Freq: Three times a day (TID) | ORAL | Status: DC | PRN

## 2013-10-18 MED ORDER — PREDNISONE 10 MG PO TABS: ORAL_TABLET | ORAL | Status: DC

## 2013-10-18 MED ORDER — CALCIUM 1200-1000 MG-UNIT PO CHEW: 1.0000 | CHEWABLE_TABLET | Freq: Every day | ORAL | Status: DC

## 2013-10-18 NOTE — Progress Notes (Signed)
Patient ID: [name] [name], female   DOB: 1930/05/03, 79 y.o.   MRN: [id number]  Chief Complaint  Patient presents with  . Medical Managment of Chronic Issues    1 month follow-up    Allergies  Allergen Reactions  . Cetirizine Hcl Other (See Comments)    unknown  . Codeine Other (See Comments)    unknown  . Fish Allergy   . Indomethacin Other (See Comments)    unknown  . Lyrica [Pregabalin]     hallucinations  . Methyldopa Other (See Comments)    unknown  . Shellfish Allergy Hives  . Tomato Rash    HPI 79 y/o female patient is here for follow up. She is s/p left knee arthroplasty for her severe OA and is undergoing rehabilitation now at home and her last therapy session in on Friday 10/20/13.  Has not required tylenol recently. Her knee pain has resolved. Her bp has been under better control. She continues to be on prednisone for unclear reason  Review of Systems   Constitutional: Negative for fever, chills and appetite change.   HENT: Negative for congestion.    Respiratory: Negative for choking and shortness of breath.    Cardiovascular: Negative for chest pain, palpitations and leg swelling.   Gastrointestinal: Negative for abdominal pain, diarrhea and constipation.   Genitourinary: Negative for dysuria.   Musculoskeletal: Positive for arthralgias.  Neurological: Negative for dizziness and light-headedness.   Psychiatric/Behavioral: Negative for behavioral problems and agitation.   Physical exam BP 134/82  Pulse 82  Resp 12  Wt 194 lb (87.998 kg)  SpO2 95%  Constitutional: She is oriented to person, place, and time. No distress.  obese  HENT:  Head: Normocephalic and atraumatic.   Nose: Nose normal.   Mouth/Throat: Oropharynx is clear and moist.   Eyes: Conjunctivae are normal. Pupils are equal, round, and reactive to light.   Neck: Normal range of motion. Neck supple. No JVD present. No tracheal deviation present.   Cardiovascular: Normal rate, regular  rhythm and normal heart sounds.    No murmur heard. Pulmonary/Chest: Effort normal and breath sounds normal.   Abdominal: Soft. Bowel sounds are normal. She exhibits no distension. There is no tenderness.  Musculoskeletal: Normal range of motion. She exhibits no edema.  Lymphadenopathy:    She has no cervical adenopathy.  Neurological: She is alert and oriented to person, place, and time. She has normal reflexes.   Skin: Skin is warm and dry. She is not diaphoretic.  Psychiatric: She has a normal mood and affect. Her behavior is normal.  Labs- CBC    Component Value Date/Time   WBC 7.2 09/12/2013 1222   WBC 6.9 09/05/2013 1445   RBC 3.37* 09/12/2013 1222   RBC 3.17* 09/05/2013 1445   HGB 10.0* 09/12/2013 1222   HCT 31.3* 09/12/2013 1222   PLT 289 09/05/2013 1445   MCV 93 09/12/2013 1222   MCH 29.7 09/12/2013 1222   MCH 30.9 09/05/2013 1445   MCHC 31.9 09/12/2013 1222   MCHC 34.1 09/05/2013 1445   RDW 16.0* 09/12/2013 1222   RDW 15.8* 09/05/2013 1445   LYMPHSABS 1.8 09/12/2013 1222   LYMPHSABS 2.2 09/05/2013 1445   MONOABS 0.6 09/05/2013 1445   EOSABS 0.6* 09/12/2013 1222   EOSABS 0.6 09/05/2013 1445   BASOSABS 0.0 09/12/2013 1222   BASOSABS 0.0 09/05/2013 1445    Lab Results  Component Value Date   TSH 1.660 09/12/2013   Lab Results  Component Value Date  HGBA1C 7.1* 08/07/2013   CMP     Component Value Date/Time   NA 138 09/12/2013 1222   NA 133* 09/05/2013 1445   K 3.9 09/12/2013 1222   CL 98 09/12/2013 1222   CO2 24 09/12/2013 1222   GLUCOSE 138* 09/12/2013 1222   GLUCOSE 113* 09/05/2013 1445   BUN 9 09/12/2013 1222   BUN 11 09/05/2013 1445   CREATININE 0.79 09/12/2013 1222   CALCIUM 9.6 09/12/2013 1222   PROT 6.9 09/12/2013 1222   PROT 7.4 07/28/2013 1356   ALBUMIN 3.4* 07/28/2013 1356   AST 14 09/12/2013 1222   ALT 7 09/12/2013 1222   ALKPHOS 89 09/12/2013 1222   BILITOT 0.3 09/12/2013 1222   GFRNONAA 70 09/12/2013 1222   GFRAA 81 09/12/2013 1222     Assessment/plan  1. HYPERTENSION bp well controlled. Will d/c her clonidine and change hydralazine to 25 mg tid for now. Monitor bp at home. Reassess next visit - CMP; Future - Lipid Panel; Future  2. DIABETES MELLITUS, TYPE II Continue metformin and will recheck a1c prior to next visit - CBC with Differential; Future - Hemoglobin A1c; Future  3. OSTEOARTHRITIS pain under control with her current oxycodone, continue this  4. Gout no recent gout flare. Continue allopurinol - Uric Acid; Future  5. CAD (coronary artery disease) Remains chest pain free. Continue her imdur, hydralazine, diovan-hct and verapamil.  Will taper her prednisone to 5 mg daily for a week as she has chronically been on it and will stop it after that

## 2013-10-20 ENCOUNTER — Ambulatory Visit: Payer: Medicare Other | Admitting: Physical Therapy

## 2013-10-23 ENCOUNTER — Other Ambulatory Visit: Payer: Self-pay | Admitting: *Deleted

## 2013-10-24 ENCOUNTER — Other Ambulatory Visit: Payer: Self-pay | Admitting: *Deleted

## 2013-10-24 MED ORDER — HYDRALAZINE HCL 25 MG PO TABS: ORAL_TABLET | ORAL | Status: DC

## 2013-10-26 ENCOUNTER — Other Ambulatory Visit: Payer: Self-pay | Admitting: Nurse Practitioner

## 2013-10-30 ENCOUNTER — Telehealth: Payer: Self-pay | Admitting: *Deleted

## 2013-10-30 MED ORDER — PREDNISONE 10 MG PO TABS: 5.0000 mg | ORAL_TABLET | Freq: Every day | ORAL | Status: DC

## 2013-10-30 NOTE — Telephone Encounter (Signed)
Pt's daughter notified VIA phone.

## 2013-10-30 NOTE — Telephone Encounter (Signed)
Pt's daughter notified VIA phone and rx for Predinsone 5 mg to be sent to pharmacy.

## 2013-10-30 NOTE — Telephone Encounter (Signed)
Looks like chronic prednisone was added for her severe arthritis to help keep the inflammation down. If stopping it caused her to flare up, lets have her on 5 mg po daily for now until next office visit.

## 2013-10-30 NOTE — Telephone Encounter (Signed)
Daughter stated that patient took her last Prednisone last Wednesday but daughter stated that over the weekend she started swelling and pain. Daughter started her back on it Sunday 10mg . Daughter wants to know if this ok to start back on it? Patient did feel a lot better after taking. Should they just stay on 10 or 5 or is there an alternative. Please Advise.

## 2013-11-01 ENCOUNTER — Other Ambulatory Visit: Payer: Self-pay | Admitting: Internal Medicine

## 2013-11-01 MED ORDER — PREDNISONE 10 MG PO TABS: 5.0000 mg | ORAL_TABLET | Freq: Every day | ORAL | Status: DC

## 2013-11-22 ENCOUNTER — Other Ambulatory Visit: Payer: Self-pay | Admitting: Nurse Practitioner

## 2013-11-29 ENCOUNTER — Other Ambulatory Visit: Payer: Self-pay | Admitting: *Deleted

## 2013-11-29 MED ORDER — OXYCODONE HCL 5 MG PO TABS: 5.0000 mg | ORAL_TABLET | Freq: Three times a day (TID) | ORAL | Status: DC | PRN

## 2013-12-14 ENCOUNTER — Other Ambulatory Visit: Payer: Self-pay | Admitting: Nurse Practitioner

## 2013-12-14 ENCOUNTER — Telehealth: Payer: Self-pay | Admitting: *Deleted

## 2013-12-14 NOTE — Telephone Encounter (Signed)
Patient called and wanted to know her blood type. Discussed with patient that we do not type blood and most insurances would not cover to have it done. Told her that is she donated blood they type it and send her a card if she needed to know. She thanked me and hung up

## 2013-12-19 ENCOUNTER — Encounter: Payer: Self-pay | Admitting: Internal Medicine

## 2013-12-20 ENCOUNTER — Other Ambulatory Visit: Payer: Self-pay | Admitting: Nurse Practitioner

## 2013-12-21 ENCOUNTER — Telehealth: Payer: Self-pay | Admitting: *Deleted

## 2013-12-21 ENCOUNTER — Encounter (HOSPITAL_BASED_OUTPATIENT_CLINIC_OR_DEPARTMENT_OTHER): Payer: Self-pay | Admitting: Emergency Medicine

## 2013-12-21 ENCOUNTER — Emergency Department (HOSPITAL_BASED_OUTPATIENT_CLINIC_OR_DEPARTMENT_OTHER): Payer: Medicare Other

## 2013-12-21 ENCOUNTER — Emergency Department (HOSPITAL_BASED_OUTPATIENT_CLINIC_OR_DEPARTMENT_OTHER)
Admission: EM | Admit: 2013-12-21 | Discharge: 2013-12-21 | Disposition: A | Discharge status: Home / Self Care | Payer: Medicare Other | Attending: Emergency Medicine | Admitting: Emergency Medicine

## 2013-12-21 ENCOUNTER — Other Ambulatory Visit: Payer: Self-pay | Admitting: Nurse Practitioner

## 2013-12-21 DIAGNOSIS — E119 Type 2 diabetes mellitus without complications: Secondary | ICD-10-CM | POA: Insufficient documentation

## 2013-12-21 DIAGNOSIS — M109 Gout, unspecified: Secondary | ICD-10-CM | POA: Insufficient documentation

## 2013-12-21 DIAGNOSIS — Z8639 Personal history of other endocrine, nutritional and metabolic disease: Secondary | ICD-10-CM | POA: Insufficient documentation

## 2013-12-21 DIAGNOSIS — Z79899 Other long term (current) drug therapy: Secondary | ICD-10-CM | POA: Insufficient documentation

## 2013-12-21 DIAGNOSIS — IMO0002 Reserved for concepts with insufficient information to code with codable children: Secondary | ICD-10-CM | POA: Insufficient documentation

## 2013-12-21 DIAGNOSIS — J069 Acute upper respiratory infection, unspecified: Secondary | ICD-10-CM

## 2013-12-21 DIAGNOSIS — M7040 Prepatellar bursitis, unspecified knee: Secondary | ICD-10-CM | POA: Insufficient documentation

## 2013-12-21 DIAGNOSIS — M542 Cervicalgia: Secondary | ICD-10-CM | POA: Insufficient documentation

## 2013-12-21 DIAGNOSIS — Z9181 History of falling: Secondary | ICD-10-CM | POA: Insufficient documentation

## 2013-12-21 DIAGNOSIS — I10 Essential (primary) hypertension: Secondary | ICD-10-CM | POA: Insufficient documentation

## 2013-12-21 DIAGNOSIS — R630 Anorexia: Secondary | ICD-10-CM | POA: Insufficient documentation

## 2013-12-21 DIAGNOSIS — R51 Headache: Secondary | ICD-10-CM | POA: Insufficient documentation

## 2013-12-21 DIAGNOSIS — M129 Arthropathy, unspecified: Secondary | ICD-10-CM | POA: Insufficient documentation

## 2013-12-21 DIAGNOSIS — H9209 Otalgia, unspecified ear: Secondary | ICD-10-CM | POA: Insufficient documentation

## 2013-12-21 DIAGNOSIS — I2510 Atherosclerotic heart disease of native coronary artery without angina pectoris: Secondary | ICD-10-CM | POA: Insufficient documentation

## 2013-12-21 DIAGNOSIS — Z791 Long term (current) use of non-steroidal anti-inflammatories (NSAID): Secondary | ICD-10-CM | POA: Insufficient documentation

## 2013-12-21 DIAGNOSIS — Z8673 Personal history of transient ischemic attack (TIA), and cerebral infarction without residual deficits: Secondary | ICD-10-CM | POA: Insufficient documentation

## 2013-12-21 DIAGNOSIS — M1990 Unspecified osteoarthritis, unspecified site: Secondary | ICD-10-CM | POA: Insufficient documentation

## 2013-12-21 DIAGNOSIS — I252 Old myocardial infarction: Secondary | ICD-10-CM | POA: Insufficient documentation

## 2013-12-21 LAB — GLUCOSE, CAPILLARY: Glucose-Capillary: 132 mg/dL — ABNORMAL HIGH (ref 70–99)

## 2013-12-21 MED ORDER — ACETAMINOPHEN 325 MG PO TABS
650.0000 mg | ORAL_TABLET | Freq: Once | ORAL | Status: AC
  Administered 2013-12-21: 650 mg via ORAL
  Filled 2013-12-21: qty 2

## 2013-12-21 NOTE — ED Provider Notes (Signed)
CSN: [id number]     Arrival date & time 12/21/13  1429 History   First MD Initiated Contact with Patient 12/21/13 1458     Chief Complaint  Patient presents with  . Headache  . URI  . Neck Pain     (Consider location/radiation/quality/duration/timing/severity/associated sxs/prior Treatment) HPI Comments: Patient states she thinks is a sinus infection. She endorses nonproductive cough, body aches, sneezing, congestion and tearfulness since yesterday. Denies any fever, chest pain or shortness of breath. Denies any headache or neck pain. Denies any focal weakness, numbness or tingling. Sick contacts. She's had decreased appetite but no nausea or vomiting. No diarrhea. She did receive a flu shot. She denies any dizziness, lightheadedness, focal weakness, numbness or tingling.  The history is provided by the patient and a relative.    Past Medical History  Diagnosis Date  . Hypertension   . First degree atrioventricular block   . Onychia and paronychia of toe   . Muscle weakness (generalized)   . Allergic rhinitis due to pollen   . Gout, unspecified   . Disorder of bone and cartilage, unspecified   . Edema   . Hyperlipidemia   . Unspecified essential hypertension   . Coronary atherosclerosis of native coronary artery   . Other malaise and fatigue   . Cough   . Other fall   . Unspecified vitamin D deficiency   . TIA (transient ischemic attack)     "a few one summer" (08/07/2013)  . Myocardial infarction ~ 1970  . Exertional shortness of breath     "sometimes" (08/07/2013)  . Type II diabetes mellitus     "fasting 90-110s" (08/07/2013)  . Osteoarthrosis, unspecified whether generalized or localized, unspecified site   . Prepatellar bursitis   . Arthritis     "in q joint" (08/07/2013)   Past Surgical History  Procedure Laterality Date  . Tonsillectomy  08/1974  . Replacement total knee Right 09/2005  . Shoulder open rotator cuff repair Right 07/1999  . Appendectomy  1960  .  Abdominal hysterectomy  04/1980  . Transthoracic echocardiogram  2003    EF 55-65%; mild concentric LVH  . Joint replacement    . Total knee arthroplasty Left 08/07/2013  . Cataract extraction w/ intraocular lens  implant, bilateral Bilateral ~ 2012  . Total knee arthroplasty Left 08/07/2013    Procedure: TOTAL KNEE ARTHROPLASTY- LEFT;  Surgeon: Dannielle Huh, MD;  Location: [facility] OR;  Service: Orthopedics;  Laterality: Left;  . Cardiac catheterization  2003   Family History  Problem Relation Age of Onset  . Heart attack Father    History  Substance Use Topics  . Smoking status: Never Smoker   . Smokeless tobacco: Never Used  . Alcohol Use: No   OB History   Grav Para Term Preterm Abortions TAB SAB Ect Mult Living                 Review of Systems  Constitutional: Positive for activity change, appetite change and fatigue. Negative for fever.  HENT: Positive for congestion, ear pain, rhinorrhea and sinus pressure. Negative for trouble swallowing.   Respiratory: Positive for cough. Negative for chest tightness and shortness of breath.   Cardiovascular: Negative for chest pain.  Gastrointestinal: Negative for nausea, vomiting and abdominal pain.  Genitourinary: Negative for dysuria.  Musculoskeletal: Positive for arthralgias and myalgias. Negative for neck pain.  Neurological: Positive for weakness and headaches. Negative for dizziness.  A complete 10 system review of systems was obtained and  all systems are negative except as noted in the HPI and PMH.      Allergies  Cetirizine hcl; Codeine; Fish allergy; Indomethacin; Lyrica; Methyldopa; Shellfish allergy; and Tomato  Home Medications   Current Outpatient Rx  Name  Route  Sig  Dispense  Refill  . albuterol (PROVENTIL HFA;VENTOLIN HFA) 108 (90 BASE) MCG/ACT inhaler   Inhalation   Inhale 2 puffs into the lungs every 6 (six) hours as needed for wheezing.   1 Inhaler   0   . allopurinol (ZYLOPRIM) 100 MG tablet   Oral   Take  100 mg by mouth daily. For gout         . allopurinol (ZYLOPRIM) 100 MG tablet      TAKE 1 TABLET BY MOUTH EVERY DAY FOR GOUT   30 tablet   3   . Calcium 1200-1000 MG-UNIT CHEW   Oral   Chew 1 tablet by mouth daily.   30 each   11   . EXPIRED: enoxaparin (LOVENOX) 40 MG/0.4ML injection   Subcutaneous   Inject 0.4 mLs (40 mg total) into the skin daily.   11 Syringe   0   . Fluticasone-Salmeterol (ADVAIR) 100-50 MCG/DOSE AEPB   Inhalation   Inhale 1 puff into the lungs 2 (two) times daily.   1 each   3   . hydrALAZINE (APRESOLINE) 25 MG tablet      Take one tablet by mouth three times daily to control blood pressure   90 tablet   5   . isosorbide mononitrate (IMDUR) 30 MG 24 hr tablet   Oral   Take 45 mg by mouth daily.         . isosorbide mononitrate (IMDUR) 30 MG 24 hr tablet      TAKE 1 AND 1/2 TABLETS BY MOUTH TWICE DAILY   135 tablet   0   . metFORMIN (GLUCOPHAGE) 500 MG tablet   Oral   Take 500 mg by mouth daily with breakfast. For diabetes         . metFORMIN (GLUCOPHAGE) 500 MG tablet      TAKE 1 TABLET BY MOUTH EVERY DAY FOR DIABETES   30 tablet   5   . metoprolol (LOPRESSOR) 100 MG tablet      TAKE 1 TABLET BY MOUTH TWICE DAILY   60 tablet   0   . metoprolol succinate (TOPROL-XL) 100 MG 24 hr tablet   Oral   Take 100 mg by mouth daily. Take with or immediately following a meal.         . oxyCODONE (OXY IR/ROXICODONE) 5 MG immediate release tablet   Oral   Take 1-2 tablets (5-10 mg total) by mouth every 8 (eight) hours as needed.   90 tablet   0   . polyvinyl alcohol (LIQUIFILM TEARS) 1.4 % ophthalmic solution   Both Eyes   Place 1 drop into both eyes as needed (for dry eyes).         . predniSONE (DELTASONE) 10 MG tablet      Take half a tablet by mouth once daily for 1 week and then stop it   30 tablet   0   . predniSONE (DELTASONE) 10 MG tablet   Oral   Take 0.5 tablets (5 mg total) by mouth daily with breakfast.    30 tablet   1   . triamcinolone cream (KENALOG) 0.1 %      APPLY TO AFFECTED AREA TWICE DAILY AS NEEDED  45 g   1   . triamcinolone cream (KENALOG) 0.1 %      APPLY TO AFFECTED AREA TWICE DAILY AS NEEDED   30 g   0   . valsartan-hydrochlorothiazide (DIOVAN-HCT) 160-25 MG per tablet   Oral   Take 1 tablet by mouth daily. For blood pressure         . valsartan-hydrochlorothiazide (DIOVAN-HCT) 160-25 MG per tablet      TAKE 1 TABLET BY MOUTH EVERY DAY   30 tablet   5   . verapamil (VERELAN PM) 240 MG 24 hr capsule      TAKE 1 CAPSULE BY MOUTH EVERY DAY FOR BLOOD PRESSURE   30 capsule   3   . VOLTAREN 1 % GEL      APPLY TOPICALLY 3 TO 4 TIMES DAILY.   100 g   1   . VOLTAREN 1 % GEL      APPLY TOPICALLY 3 TO 4 TIMES DAILY.   100 g   0    BP 194/77  Pulse 86  Temp(Src) 99.4 F (37.4 C)  Resp 16  Ht 5\' 4"  (1.626 m)  Wt 200 lb (90.719 kg)  BMI 34.31 kg/m2  SpO2 95% Physical Exam  Constitutional: She is oriented to person, place, and time. She appears well-developed and well-nourished. No distress.  HENT:  Head: Normocephalic and atraumatic.  Right Ear: External ear normal.  Left Ear: External ear normal.  Mouth/Throat: Oropharynx is clear and moist. No oropharyngeal exudate.  No sinus tenderness No lesions on palate.  Eyes: Conjunctivae and EOM are normal. Pupils are equal, round, and reactive to light.  Neck: Normal range of motion. Neck supple.  No meningismus  Cardiovascular: Normal rate, regular rhythm and normal heart sounds.   No murmur heard. Pulmonary/Chest: Effort normal and breath sounds normal. No respiratory distress.  Abdominal: Soft. There is no tenderness. There is no rebound and no guarding.  Musculoskeletal: Normal range of motion. She exhibits no edema and no tenderness.  Neurological: She is alert and oriented to person, place, and time. No cranial nerve deficit. She exhibits normal muscle tone. Coordination normal.  CN 2-12 intact,  no ataxia on finger to nose, no nystagmus, 5/5 strength throughout, no pronator drift, Romberg negative, normal gait.   Skin: Skin is warm.    ED Course  Procedures (including critical care time) Labs Review Labs Reviewed  GLUCOSE, CAPILLARY - Abnormal; Notable for the following:    Glucose-Capillary 132 (*)    All other components within normal limits   Imaging Review Dg Chest 2 View  12/21/2013   CLINICAL DATA:  Cough, congestion.  EXAM: CHEST  2 VIEW  COMPARISON:  DG CHEST 1V PORT dated 09/05/2013  FINDINGS: Low lung volumes. Cardiac silhouette is moderately enlarged. Aorta is tortuous and ectatic. There is mild prominence of the interstitial markings. No focal regions consolidation or focal infiltrates. Degenerative changes appreciated within the right and left shoulders. The osseous structures otherwise unremarkable.  IMPRESSION: Mild bronchitic changes likely chronic. No focal regions of consolidation or focal infiltrates.   Electronically Signed   By: Salome Holmes [name]   On: 12/21/2013 16:09    EKG Interpretation   None       MDM   Final diagnoses:  URI (upper respiratory infection)    2 history of URI type symptoms of cough, congestion, body aches, sneezing and chills. Patient appears nontoxic. She is afebrile. She is on chronic prednisone for RA.  Nonsmoker.  Chest x-ray shows no infiltrate. Blood sugar 132. Patient is afebrile. She is hypertensive but denies any headache, chest pain, visual change or shortness of breath.  Suspect viral URI. Discuss antibiotics not indicated. Discuss supportive care with OTC cough and cold treatments. Follow with PCP this week. Return precautions discussed.    Glynn Octave, MD 12/21/13 [phone]

## 2013-12-21 NOTE — Telephone Encounter (Signed)
Caregiver called for patient and stated that she had a sinus infection and wanted to know what she can take OTC. Advised patient to go to pharmacy and check with pharmacist to compare medications she is currently taking. She agreed and if she starts running fever or any new symptoms will take her to Urgent Care.

## 2013-12-21 NOTE — Discharge Instructions (Signed)

## 2013-12-21 NOTE — ED Notes (Signed)
Cold symptoms, neck discomfort, headache, sneezing, and ear pain since yesterday.

## 2013-12-22 NOTE — Telephone Encounter (Signed)
Caregiver called and stated that they took patient to urgent care and she has a URI. Told her to get OTC medication to take and follow up with Primary. Caregiver stated that her BP tends to run high so I suggested Cordacin and told her to monitor Blood pressure. Scheduled an appointment for 12/27/2013. Caregiver to take her back to Urgent Care if symptoms worsen.

## 2013-12-23 ENCOUNTER — Encounter (HOSPITAL_COMMUNITY): Payer: Self-pay | Admitting: Emergency Medicine

## 2013-12-23 ENCOUNTER — Emergency Department (HOSPITAL_COMMUNITY)
Admission: EM | Admit: 2013-12-23 | Discharge: 2013-12-23 | Disposition: A | Discharge status: Home / Self Care | Payer: Medicare Other | Attending: Emergency Medicine | Admitting: Emergency Medicine

## 2013-12-23 ENCOUNTER — Emergency Department (HOSPITAL_COMMUNITY): Payer: Medicare Other

## 2013-12-23 DIAGNOSIS — Z7982 Long term (current) use of aspirin: Secondary | ICD-10-CM | POA: Insufficient documentation

## 2013-12-23 DIAGNOSIS — M1990 Unspecified osteoarthritis, unspecified site: Secondary | ICD-10-CM | POA: Insufficient documentation

## 2013-12-23 DIAGNOSIS — Z79899 Other long term (current) drug therapy: Secondary | ICD-10-CM | POA: Insufficient documentation

## 2013-12-23 DIAGNOSIS — I252 Old myocardial infarction: Secondary | ICD-10-CM | POA: Insufficient documentation

## 2013-12-23 DIAGNOSIS — Z8673 Personal history of transient ischemic attack (TIA), and cerebral infarction without residual deficits: Secondary | ICD-10-CM | POA: Insufficient documentation

## 2013-12-23 DIAGNOSIS — I2510 Atherosclerotic heart disease of native coronary artery without angina pectoris: Secondary | ICD-10-CM | POA: Insufficient documentation

## 2013-12-23 DIAGNOSIS — J069 Acute upper respiratory infection, unspecified: Secondary | ICD-10-CM | POA: Insufficient documentation

## 2013-12-23 DIAGNOSIS — E119 Type 2 diabetes mellitus without complications: Secondary | ICD-10-CM | POA: Insufficient documentation

## 2013-12-23 DIAGNOSIS — Z872 Personal history of diseases of the skin and subcutaneous tissue: Secondary | ICD-10-CM | POA: Insufficient documentation

## 2013-12-23 DIAGNOSIS — E559 Vitamin D deficiency, unspecified: Secondary | ICD-10-CM | POA: Insufficient documentation

## 2013-12-23 DIAGNOSIS — IMO0002 Reserved for concepts with insufficient information to code with codable children: Secondary | ICD-10-CM | POA: Insufficient documentation

## 2013-12-23 DIAGNOSIS — Z8709 Personal history of other diseases of the respiratory system: Secondary | ICD-10-CM | POA: Insufficient documentation

## 2013-12-23 DIAGNOSIS — I10 Essential (primary) hypertension: Secondary | ICD-10-CM | POA: Insufficient documentation

## 2013-12-23 DIAGNOSIS — Z8781 Personal history of (healed) traumatic fracture: Secondary | ICD-10-CM | POA: Insufficient documentation

## 2013-12-23 DIAGNOSIS — M109 Gout, unspecified: Secondary | ICD-10-CM | POA: Insufficient documentation

## 2013-12-23 LAB — CBC WITH DIFFERENTIAL/PLATELET
Basophils Absolute: 0 10*3/uL (ref 0.0–0.1)
Basophils Relative: 0 % (ref 0–1)
Eosinophils Absolute: 0.2 10*3/uL (ref 0.0–0.7)
Eosinophils Relative: 2 % (ref 0–5)
HCT: 34.8 % — ABNORMAL LOW (ref 36.0–46.0)
Hemoglobin: 12 g/dL (ref 12.0–15.0)
Lymphocytes Relative: 28 % (ref 12–46)
Lymphs Abs: 2.2 10*3/uL (ref 0.7–4.0)
MCH: 28.8 pg (ref 26.0–34.0)
MCHC: 34.5 g/dL (ref 30.0–36.0)
MCV: 83.5 fL (ref 78.0–100.0)
Monocytes Absolute: 0.6 10*3/uL (ref 0.1–1.0)
Monocytes Relative: 8 % (ref 3–12)
Neutro Abs: 4.8 10*3/uL (ref 1.7–7.7)
Neutrophils Relative %: 61 % (ref 43–77)
Platelets: 290 10*3/uL (ref 150–400)
RBC: 4.17 MIL/uL (ref 3.87–5.11)
RDW: 17.8 % — ABNORMAL HIGH (ref 11.5–15.5)
WBC: 7.8 10*3/uL (ref 4.0–10.5)

## 2013-12-23 LAB — URINALYSIS, ROUTINE W REFLEX MICROSCOPIC
Bilirubin Urine: NEGATIVE
Glucose, UA: NEGATIVE mg/dL
Hgb urine dipstick: NEGATIVE
Ketones, ur: NEGATIVE mg/dL
Leukocytes, UA: NEGATIVE
Nitrite: NEGATIVE
Protein, ur: 30 mg/dL — AB
Specific Gravity, Urine: 1.02 (ref 1.005–1.030)
Urobilinogen, UA: 0.2 mg/dL (ref 0.0–1.0)
pH: 6 (ref 5.0–8.0)

## 2013-12-23 LAB — COMPREHENSIVE METABOLIC PANEL
ALT: 12 U/L (ref 0–35)
AST: 21 U/L (ref 0–37)
Albumin: 3.1 g/dL — ABNORMAL LOW (ref 3.5–5.2)
Alkaline Phosphatase: 70 U/L (ref 39–117)
BUN: 19 mg/dL (ref 6–23)
CO2: 26 mEq/L (ref 19–32)
Calcium: 9.2 mg/dL (ref 8.4–10.5)
Chloride: 92 mEq/L — ABNORMAL LOW (ref 96–112)
Creatinine, Ser: 0.75 mg/dL (ref 0.50–1.10)
GFR calc Af Amer: 90 mL/min — ABNORMAL LOW (ref >90)
GFR calc non Af Amer: 77 mL/min — ABNORMAL LOW (ref >90)
Glucose, Bld: 130 mg/dL — ABNORMAL HIGH (ref 70–99)
Potassium: 3 mEq/L — ABNORMAL LOW (ref 3.7–5.3)
Sodium: 132 mEq/L — ABNORMAL LOW (ref 137–147)
Total Bilirubin: 0.4 mg/dL (ref 0.3–1.2)
Total Protein: 7.6 g/dL (ref 6.0–8.3)

## 2013-12-23 LAB — TROPONIN I: Troponin I: <0.3 ng/mL (ref <0.30)

## 2013-12-23 LAB — URINE MICROSCOPIC-ADD ON

## 2013-12-23 MED ORDER — SODIUM CHLORIDE 0.9 % IV BOLUS (SEPSIS)
500.0000 mL | Freq: Once | INTRAVENOUS | Status: AC
  Administered 2013-12-23: 500 mL via INTRAVENOUS

## 2013-12-23 MED ORDER — POTASSIUM CHLORIDE CRYS ER 20 MEQ PO TBCR
40.0000 meq | EXTENDED_RELEASE_TABLET | Freq: Once | ORAL | Status: AC
  Administered 2013-12-23: 40 meq via ORAL
  Filled 2013-12-23: qty 2

## 2013-12-23 NOTE — ED Notes (Signed)
Onset today syncope for one minute per health care provider who visits everyday.  Per EMS patient ax4 and upon arrival alert answering and following commands appropriate. States cough with clear sputum.

## 2013-12-23 NOTE — Discharge Instructions (Signed)
Plenty of fluids and rest.  Follow up with your md next week.

## 2013-12-23 NOTE — ED Notes (Signed)
Pt ambulated to the bathroom.  

## 2013-12-23 NOTE — ED Provider Notes (Signed)
CSN: [id number]     Arrival date & time 12/23/13  0846 History   First MD Initiated Contact with Patient 12/23/13 [phone]     Chief Complaint  Patient presents with  . Loss of Consciousness     (Consider location/radiation/quality/duration/timing/severity/associated sxs/prior Treatment) Patient is a 80 y.o. female presenting with cough. The history is provided by the patient (the pt complains of a cough and today she felt dizzy and weak.  she passed out in the chair).  Cough Cough characteristics:  Non-productive Severity:  Moderate Onset quality:  Gradual Timing:  Intermittent Progression:  Waxing and waning Chronicity:  New Context: not animal exposure   Associated symptoms: no chest pain, no eye discharge, no headaches and no rash     Past Medical History  Diagnosis Date  . Hypertension   . First degree atrioventricular block   . Onychia and paronychia of toe   . Muscle weakness (generalized)   . Allergic rhinitis due to pollen   . Gout, unspecified   . Disorder of bone and cartilage, unspecified   . Edema   . Hyperlipidemia   . Unspecified essential hypertension   . Coronary atherosclerosis of native coronary artery   . Other malaise and fatigue   . Cough   . Other fall   . Unspecified vitamin D deficiency   . TIA (transient ischemic attack)     "a few one summer" (08/07/2013)  . Myocardial infarction ~ 1970  . Exertional shortness of breath     "sometimes" (08/07/2013)  . Type II diabetes mellitus     "fasting 90-110s" (08/07/2013)  . Osteoarthrosis, unspecified whether generalized or localized, unspecified site   . Prepatellar bursitis   . Arthritis     "in q joint" (08/07/2013)   Past Surgical History  Procedure Laterality Date  . Tonsillectomy  08/1974  . Replacement total knee Right 09/2005  . Shoulder open rotator cuff repair Right 07/1999  . Appendectomy  1960  . Abdominal hysterectomy  04/1980  . Transthoracic echocardiogram  2003    EF 55-65%; mild  concentric LVH  . Joint replacement    . Total knee arthroplasty Left 08/07/2013  . Cataract extraction w/ intraocular lens  implant, bilateral Bilateral ~ 2012  . Total knee arthroplasty Left 08/07/2013    Procedure: TOTAL KNEE ARTHROPLASTY- LEFT;  Surgeon: Dannielle Huh, MD;  Location: [facility] OR;  Service: Orthopedics;  Laterality: Left;  . Cardiac catheterization  2003   Family History  Problem Relation Age of Onset  . Heart attack Father    History  Substance Use Topics  . Smoking status: Never Smoker   . Smokeless tobacco: Never Used  . Alcohol Use: No   OB History   Grav Para Term Preterm Abortions TAB SAB Ect Mult Living                 Review of Systems  Constitutional: Negative for appetite change and fatigue.  HENT: Negative for congestion, ear discharge and sinus pressure.   Eyes: Negative for discharge.  Respiratory: Positive for cough.   Cardiovascular: Negative for chest pain.  Gastrointestinal: Negative for abdominal pain and diarrhea.  Genitourinary: Negative for frequency and hematuria.  Musculoskeletal: Negative for back pain.  Skin: Negative for rash.  Neurological: Negative for seizures and headaches.  Psychiatric/Behavioral: Negative for hallucinations.      Allergies  Cetirizine hcl; Codeine; Fish allergy; Indomethacin; Lyrica; Methyldopa; Shellfish allergy; and Tomato  Home Medications   Current Outpatient Rx  Name  Route  Sig  Dispense  Refill  . albuterol (PROVENTIL HFA;VENTOLIN HFA) 108 (90 BASE) MCG/ACT inhaler   Inhalation   Inhale 2 puffs into the lungs every 6 (six) hours as needed for wheezing.   1 Inhaler   0   . allopurinol (ZYLOPRIM) 100 MG tablet   Oral   Take 100 mg by mouth daily. For gout         . aspirin 81 MG tablet   Oral   Take 81 mg by mouth daily.         . Calcium 1200-1000 MG-UNIT CHEW   Oral   Chew 1 tablet by mouth daily.   30 each   11   . hydrALAZINE (APRESOLINE) 25 MG tablet      Take one tablet by  mouth three times daily to control blood pressure   90 tablet   5   . isosorbide mononitrate (IMDUR) 30 MG 24 hr tablet   Oral   Take 45 mg by mouth daily.         . metFORMIN (GLUCOPHAGE) 500 MG tablet   Oral   Take 500 mg by mouth daily with breakfast. For diabetes         . metoprolol succinate (TOPROL-XL) 100 MG 24 hr tablet   Oral   Take 100 mg by mouth daily. Take with or immediately following a meal.         . OVER THE COUNTER MEDICATION   Oral   Take 1 tablet by mouth daily. Wellness Formula herbal vitamin         . OVER THE COUNTER MEDICATION   Oral   Take 1 tablet by mouth daily. Flexim--joint supplement         . oxyCODONE (OXY IR/ROXICODONE) 5 MG immediate release tablet   Oral   Take 1-2 tablets (5-10 mg total) by mouth every 8 (eight) hours as needed.   90 tablet   0   . oxycodone (OXY-IR) 5 MG capsule   Oral   Take 5 mg by mouth daily.         . polyvinyl alcohol (LIQUIFILM TEARS) 1.4 % ophthalmic solution   Both Eyes   Place 1 drop into both eyes as needed (for dry eyes).         . predniSONE (DELTASONE) 10 MG tablet   Oral   Take 10 mg by mouth daily with breakfast.         . triamcinolone cream (KENALOG) 0.1 %      APPLY TO AFFECTED AREA TWICE DAILY AS NEEDED   45 g   1   . valsartan-hydrochlorothiazide (DIOVAN-HCT) 160-25 MG per tablet   Oral   Take 1 tablet by mouth daily. For blood pressure         . verapamil (VERELAN PM) 240 MG 24 hr capsule      TAKE 1 CAPSULE BY MOUTH EVERY DAY FOR BLOOD PRESSURE   30 capsule   3   . VITAMIN D, ERGOCALCIFEROL, PO   Oral   Take 1 tablet by mouth daily.         . VOLTAREN 1 % GEL      APPLY TOPICALLY 3 TO 4 TIMES DAILY.   100 g   1   . Fluticasone-Salmeterol (ADVAIR) 100-50 MCG/DOSE AEPB   Inhalation   Inhale 1 puff into the lungs 2 (two) times daily.   1 each   3    BP 146/80  Pulse 85  Temp(Src) 98.1 F (36.7 C) (Oral)  Resp 16  Ht 5\' 4"  (1.626 m)  Wt 200 lb  (90.719 kg)  BMI 34.31 kg/m2  SpO2 100% Physical Exam  Constitutional: She is oriented to person, place, and time. She appears well-developed.  HENT:  Head: Normocephalic.  Eyes: Conjunctivae and EOM are normal. No scleral icterus.  Neck: Neck supple. No thyromegaly present.  Cardiovascular: Normal rate and regular rhythm.  Exam reveals no gallop and no friction rub.   No murmur heard. Pulmonary/Chest: No stridor. She has no wheezes. She has no rales. She exhibits no tenderness.  Abdominal: She exhibits no distension. There is no tenderness. There is no rebound.  Musculoskeletal: Normal range of motion. She exhibits no edema.  Lymphadenopathy:    She has no cervical adenopathy.  Neurological: She is oriented to person, place, and time. She exhibits normal muscle tone. Coordination normal.  Skin: No rash noted. No erythema.  Psychiatric: She has a normal mood and affect. Her behavior is normal.    ED Course  Procedures (including critical care time) Labs Review Labs Reviewed  CBC WITH DIFFERENTIAL - Abnormal; Notable for the following:    HCT 34.8 (*)    RDW 17.8 (*)    All other components within normal limits  COMPREHENSIVE METABOLIC PANEL - Abnormal; Notable for the following:    Sodium 132 (*)    Potassium 3.0 (*)    Chloride 92 (*)    Glucose, Bld 130 (*)    Albumin 3.1 (*)    GFR calc non Af Amer 77 (*)    GFR calc Af Amer 90 (*)    All other components within normal limits  URINALYSIS, ROUTINE W REFLEX MICROSCOPIC - Abnormal; Notable for the following:    Protein, ur 30 (*)    All other components within normal limits  TROPONIN I  URINE MICROSCOPIC-ADD ON   Imaging Review Dg Chest 2 View  12/23/2013   CLINICAL DATA:  Cough and shortness of breath 1 week.  EXAM: CHEST  2 VIEW  COMPARISON:  12/21/2013 and 09/05/2013  FINDINGS: Lungs are adequately inflated without consolidation or effusion. There is mild stable cardiomegaly. There is spondylosis of the spine and  degenerative changes of the shoulders.  IMPRESSION: No active cardiopulmonary disease.   Electronically Signed   By: 09/07/2013 [name]   On: 12/23/2013 10:58   Dg Chest 2 View  12/21/2013   CLINICAL DATA:  Cough, congestion.  EXAM: CHEST  2 VIEW  COMPARISON:  DG CHEST 1V PORT dated 09/05/2013  FINDINGS: Low lung volumes. Cardiac silhouette is moderately enlarged. Aorta is tortuous and ectatic. There is mild prominence of the interstitial markings. No focal regions consolidation or focal infiltrates. Degenerative changes appreciated within the right and left shoulders. The osseous structures otherwise unremarkable.  IMPRESSION: Mild bronchitic changes likely chronic. No focal regions of consolidation or focal infiltrates.   Electronically Signed   By: 09/07/2013 [name]   On: 12/21/2013 16:09    EKG Interpretation    Date/Time:  Saturday December 23 2013 08:55:12 EST Ventricular Rate:  88 PR Interval:  159 QRS Duration: 82 QT Interval:  390 QTC Calculation: 472 R Axis:   -31 Text Interpretation:  Sinus rhythm Probable left atrial enlargement Left ventricular hypertrophy Inferior infarct, old Confirmed by Kareen Jefferys  MD, Katty Fretwell ([id number]) on 12/23/2013 2:35:16 PM            MDM   Final diagnoses:  URI (upper respiratory infection)  Benny Lennert, MD 12/23/13 [phone]

## 2013-12-25 NOTE — Telephone Encounter (Signed)
Caregiver called and left voice message and stated that they had to take her mother to the ER Saturday because she passed out. Stated that she thinks it came from taking the Cordacin. She said that patient was doing much better and has gone out of town to recover with the other daughter. She wanted to cancel appointment for Wednesday. I canceled it

## 2013-12-27 ENCOUNTER — Ambulatory Visit: Payer: Self-pay | Admitting: Internal Medicine

## 2014-01-12 ENCOUNTER — Other Ambulatory Visit: Payer: Medicare Other

## 2014-01-12 DIAGNOSIS — M109 Gout, unspecified: Secondary | ICD-10-CM

## 2014-01-12 DIAGNOSIS — E119 Type 2 diabetes mellitus without complications: Secondary | ICD-10-CM

## 2014-01-12 DIAGNOSIS — I10 Essential (primary) hypertension: Secondary | ICD-10-CM

## 2014-01-13 LAB — CBC WITH DIFFERENTIAL/PLATELET
Basophils Absolute: 0.1 10*3/uL (ref 0.0–0.2)
Basos: 1 %
Eos: 5 %
Eosinophils Absolute: 0.6 10*3/uL — ABNORMAL HIGH (ref 0.0–0.4)
HCT: 33.3 % — ABNORMAL LOW (ref 34.0–46.6)
Hemoglobin: 11.3 g/dL (ref 11.1–15.9)
Immature Grans (Abs): 0 10*3/uL (ref 0.0–0.1)
Immature Granulocytes: 0 %
Lymphocytes Absolute: 2.6 10*3/uL (ref 0.7–3.1)
Lymphs: 23 %
MCH: 28 pg (ref 26.6–33.0)
MCHC: 33.9 g/dL (ref 31.5–35.7)
MCV: 83 fL (ref 79–97)
Monocytes Absolute: 0.9 10*3/uL (ref 0.1–0.9)
Monocytes: 8 %
Neutrophils Absolute: 6.8 10*3/uL (ref 1.4–7.0)
Neutrophils Relative %: 63 %
RBC: 4.03 x10E6/uL (ref 3.77–5.28)
RDW: 17.4 % — ABNORMAL HIGH (ref 12.3–15.4)
WBC: 10.9 10*3/uL — ABNORMAL HIGH (ref 3.4–10.8)

## 2014-01-13 LAB — COMPREHENSIVE METABOLIC PANEL
ALT: 12 IU/L (ref 0–32)
AST: 16 IU/L (ref 0–40)
Albumin/Globulin Ratio: 1.1 (ref 1.1–2.5)
Albumin: 3.9 g/dL (ref 3.5–4.7)
Alkaline Phosphatase: 85 IU/L (ref 39–117)
BUN/Creatinine Ratio: 21 (ref 11–26)
BUN: 16 mg/dL (ref 8–27)
CO2: 21 mmol/L (ref 18–29)
Calcium: 10.4 mg/dL — ABNORMAL HIGH (ref 8.7–10.3)
Chloride: 97 mmol/L (ref 97–108)
Creatinine, Ser: 0.78 mg/dL (ref 0.57–1.00)
GFR calc Af Amer: 82 mL/min/{1.73_m2} (ref >59)
GFR calc non Af Amer: 72 mL/min/{1.73_m2} (ref >59)
Globulin, Total: 3.5 g/dL (ref 1.5–4.5)
Glucose: 124 mg/dL — ABNORMAL HIGH (ref 65–99)
Potassium: 3.8 mmol/L (ref 3.5–5.2)
Sodium: 137 mmol/L (ref 134–144)
Total Bilirubin: 0.4 mg/dL (ref 0.0–1.2)
Total Protein: 7.4 g/dL (ref 6.0–8.5)

## 2014-01-13 LAB — LIPID PANEL
Chol/HDL Ratio: 2.5 ratio units (ref 0.0–4.4)
Cholesterol, Total: 201 mg/dL — ABNORMAL HIGH (ref 100–199)
HDL: 81 mg/dL (ref >39)
LDL Calculated: 103 mg/dL — ABNORMAL HIGH (ref 0–99)
Triglycerides: 87 mg/dL (ref 0–149)
VLDL Cholesterol Cal: 17 mg/dL (ref 5–40)

## 2014-01-13 LAB — URIC ACID: Uric Acid: 7.5 mg/dL — ABNORMAL HIGH (ref 2.5–7.1)

## 2014-01-13 LAB — HEMOGLOBIN A1C
Est. average glucose Bld gHb Est-mCnc: 163 mg/dL
Hgb A1c MFr Bld: 7.3 % — ABNORMAL HIGH (ref 4.8–5.6)

## 2014-01-16 ENCOUNTER — Encounter: Payer: Self-pay | Admitting: Internal Medicine

## 2014-01-16 ENCOUNTER — Ambulatory Visit (INDEPENDENT_AMBULATORY_CARE_PROVIDER_SITE_OTHER): Payer: Medicare Other | Admitting: Internal Medicine

## 2014-01-16 VITALS — BP 144/86 | HR 85 | Temp 98.5°F | Wt 197.0 lb

## 2014-01-16 DIAGNOSIS — I10 Essential (primary) hypertension: Secondary | ICD-10-CM

## 2014-01-16 DIAGNOSIS — E119 Type 2 diabetes mellitus without complications: Secondary | ICD-10-CM

## 2014-01-16 DIAGNOSIS — M069 Rheumatoid arthritis, unspecified: Secondary | ICD-10-CM

## 2014-01-16 DIAGNOSIS — M109 Gout, unspecified: Secondary | ICD-10-CM

## 2014-01-16 MED ORDER — PREDNISONE 20 MG PO TABS: ORAL_TABLET | ORAL | Status: DC

## 2014-01-16 MED ORDER — OXYCODONE HCL 5 MG PO TABS: 5.0000 mg | ORAL_TABLET | Freq: Three times a day (TID) | ORAL | Status: DC | PRN

## 2014-01-16 MED ORDER — PREDNISONE 10 MG PO TABS: 10.0000 mg | ORAL_TABLET | Freq: Every day | ORAL | Status: DC

## 2014-01-16 MED ORDER — COLCHICINE 0.6 MG PO TABS
0.6000 mg | ORAL_TABLET | Freq: Every day | ORAL | Status: DC
Start: 2014-01-16 — End: 2014-01-22

## 2014-01-16 NOTE — Progress Notes (Signed)
Patient ID: [name] [name], female   DOB: 09/17/1930, 80 y.o.   MRN: [id number]    Chief Complaint  Patient presents with  . Medical Managment of Chronic Issues    3 month follow-up, discuss labs (copy printed)   . Joint Pain    joint swelling and pain  . Weakness    Generalized weakness and fatigue    Allergies  Allergen Reactions  . Cetirizine Hcl Other (See Comments)    unknown  . Codeine Other (See Comments)    unknown  . Fish Allergy   . Indomethacin Other (See Comments)    unknown  . Lyrica [Pregabalin]     hallucinations  . Methyldopa Other (See Comments)    unknown  . Shellfish Allergy Hives  . Tomato Rash   HPI 80 y/o female patient is here with her daughter for follow up visit. She complaints of the joints in her hands and legs bothering her. She has pain wit swelling. She is on chronic prednisone. Had reviewed notes from rheumatology office. She was started on methotrexate and folic acid recently but as per patient she has not picked them up. The list of side effects concerned her and thus she has not gone for follow up appointment or taken the medications. She had a gout attack recently and colchicine was helpful. She is complaint with her other medications.  Her multiple joint ache is affecting the quality of her life and make it difficult for her to move around  Review of Systems  Constitutional: Negative for fever, chills, weight loss, diaphoresis.  HENT: Negative for congestion, hearing loss and sore throat.   Eyes: Negative for blurred vision, double vision and discharge.  Respiratory: Negative for cough, sputum production, shortness of breath and wheezing.   Cardiovascular: Negative for chest pain, palpitations, orthopnea Gastrointestinal: Negative for heartburn, nausea, vomiting, abdominal pain, diarrhea and constipation.  Genitourinary: Negative for dysuria, urgency, frequency and flank pain.  Skin: Negative for itching and rash.  Neurological: Positive  for weakness. Negative for dizziness, tingling, focal weakness and headaches.  Psychiatric/Behavioral: Negative for depression and memory loss. The patient is not nervous/anxious.    Past Medical History  Diagnosis Date  . Hypertension   . First degree atrioventricular block   . Onychia and paronychia of toe   . Muscle weakness (generalized)   . Allergic rhinitis due to pollen   . Gout, unspecified   . Disorder of bone and cartilage, unspecified   . Edema   . Hyperlipidemia   . Unspecified essential hypertension   . Coronary atherosclerosis of native coronary artery   . Other malaise and fatigue   . Cough   . Other fall   . Unspecified vitamin D deficiency   . TIA (transient ischemic attack)     "a few one summer" (08/07/2013)  . Myocardial infarction ~ 1970  . Exertional shortness of breath     "sometimes" (08/07/2013)  . Type II diabetes mellitus     "fasting 90-110s" (08/07/2013)  . Osteoarthrosis, unspecified whether generalized or localized, unspecified site   . Prepatellar bursitis   . Arthritis     "in q joint" (08/07/2013)   Current Outpatient Prescriptions on File Prior to Visit  Medication Sig Dispense Refill  . albuterol (PROVENTIL HFA;VENTOLIN HFA) 108 (90 BASE) MCG/ACT inhaler Inhale 2 puffs into the lungs every 6 (six) hours as needed for wheezing.  1 Inhaler  0  . allopurinol (ZYLOPRIM) 100 MG tablet Take 100 mg by mouth daily.  For gout      . aspirin 81 MG tablet Take 81 mg by mouth daily.      . hydrALAZINE (APRESOLINE) 25 MG tablet Take one tablet by mouth three times daily to control blood pressure  90 tablet  5  . isosorbide mononitrate (IMDUR) 30 MG 24 hr tablet Take 30 mg by mouth daily.       . metFORMIN (GLUCOPHAGE) 500 MG tablet Take 500 mg by mouth daily with breakfast. For diabetes      . metoprolol succinate (TOPROL-XL) 100 MG 24 hr tablet Take 100 mg by mouth daily. Take with or immediately following a meal.      . polyvinyl alcohol (LIQUIFILM TEARS)  1.4 % ophthalmic solution Place 1 drop into both eyes as needed (for dry eyes).      . valsartan-hydrochlorothiazide (DIOVAN-HCT) 160-25 MG per tablet Take 1 tablet by mouth daily. For blood pressure      . VITAMIN D, ERGOCALCIFEROL, PO Take 1 tablet by mouth every other day.        No current facility-administered medications on file prior to visit.    Past Surgical History  Procedure Laterality Date  . Tonsillectomy  08/1974  . Replacement total knee Right 09/2005  . Shoulder open rotator cuff repair Right 07/1999  . Appendectomy  1960  . Abdominal hysterectomy  04/1980  . Transthoracic echocardiogram  2003    EF 55-65%; mild concentric LVH  . Joint replacement    . Total knee arthroplasty Left 08/07/2013  . Cataract extraction w/ intraocular lens  implant, bilateral Bilateral ~ 2012  . Total knee arthroplasty Left 08/07/2013    Procedure: TOTAL KNEE ARTHROPLASTY- LEFT;  Surgeon: Dannielle Huh, MD;  Location: [facility] OR;  Service: Orthopedics;  Laterality: Left;  . Cardiac catheterization  2003    Physical exam BP 144/86  Pulse 85  Temp(Src) 98.5 F (36.9 C) (Oral)  Wt 197 lb (89.359 kg)  SpO2 95%  Constitutional: She is oriented to person, place, and time. No distress. obese  HENT:  Head: Normocephalic and atraumatic.   Nose: Nose normal.   Mouth/Throat: Oropharynx is clear and moist.   Eyes: Conjunctivae are normal. Pupils are equal, round, and reactive to light.   Neck: Normal range of motion. Neck supple. No JVD present. No tracheal deviation present.   Cardiovascular: Normal rate, regular rhythm and normal heart sounds.    No murmur heard. Pulmonary/Chest: Effort normal and breath sounds normal.   Abdominal: Soft. Bowel sounds are normal. She exhibits no distension. There is no tenderness.  Musculoskeletal: her finger joints- right hand 2nd and 3rd MCP and PIP joint swollen, has some swelling in left wrist joint, ROM at knee joint limited with pain present Lymphadenopathy:     She has no cervical adenopathy.  Neurological: She is alert and oriented to person, place, and time. She has normal reflexes.   Skin: Skin is warm and dry. She is not diaphoretic.  Psychiatric: She has a normal mood and affect. Her behavior is normal.  Labs-   Lab Results  Component Value Date   HGBA1C 7.3* 01/12/2014    Assessment/plan  1. HYPERTENSION Stable. Continue verapamil, diovan-hct and hydralazine for now  2. DIABETES MELLITUS, TYPE II Reviewed a1c, her sugar level is slightly elevated from before and steroid could be causing this. Continue metformin 500 mg daily for now - Hemoglobin A1c  3. Rheumatoid arthritis Explained to patient in detail about the illness and course of progression. Also explained  about use of DMARDS. Her mobility is limited with the progression of her RA along with her OA. Patient willing to get her blood work and start methotrexate and folic acid after that. At present with flare up of her RA, will increase prednsione to 40 mg daily for 5 days, 20 mg daily for 3 days and then continue 10 mg daily after that. Pt to make follow up with rheumatology. Continue prn oxycodone for pain  4. Gout Reviewed uric acid level. Continue allopurinol and colchicine

## 2014-01-17 ENCOUNTER — Other Ambulatory Visit: Payer: Self-pay | Admitting: Nurse Practitioner

## 2014-01-17 ENCOUNTER — Emergency Department (HOSPITAL_COMMUNITY): Payer: Medicare Other

## 2014-01-17 ENCOUNTER — Inpatient Hospital Stay (HOSPITAL_COMMUNITY): Payer: Medicare Other

## 2014-01-17 ENCOUNTER — Other Ambulatory Visit: Payer: Self-pay | Admitting: Internal Medicine

## 2014-01-17 ENCOUNTER — Encounter (HOSPITAL_COMMUNITY): Payer: Self-pay

## 2014-01-17 ENCOUNTER — Inpatient Hospital Stay (HOSPITAL_COMMUNITY)
Admission: EM | Admit: 2014-01-17 | Discharge: 2014-01-22 | DRG: 312 | Disposition: A | Discharge status: Skilled Nursing Facility | Payer: Medicare Other | Attending: Internal Medicine | Admitting: Internal Medicine

## 2014-01-17 DIAGNOSIS — M1990 Unspecified osteoarthritis, unspecified site: Secondary | ICD-10-CM | POA: Diagnosis present

## 2014-01-17 DIAGNOSIS — M797 Fibromyalgia: Secondary | ICD-10-CM | POA: Diagnosis present

## 2014-01-17 DIAGNOSIS — I639 Cerebral infarction, unspecified: Secondary | ICD-10-CM | POA: Diagnosis present

## 2014-01-17 DIAGNOSIS — Z79899 Other long term (current) drug therapy: Secondary | ICD-10-CM | POA: Diagnosis not present

## 2014-01-17 DIAGNOSIS — Z7982 Long term (current) use of aspirin: Secondary | ICD-10-CM

## 2014-01-17 DIAGNOSIS — R569 Unspecified convulsions: Secondary | ICD-10-CM | POA: Diagnosis present

## 2014-01-17 DIAGNOSIS — E785 Hyperlipidemia, unspecified: Secondary | ICD-10-CM | POA: Diagnosis present

## 2014-01-17 DIAGNOSIS — Z8673 Personal history of transient ischemic attack (TIA), and cerebral infarction without residual deficits: Secondary | ICD-10-CM

## 2014-01-17 DIAGNOSIS — E559 Vitamin D deficiency, unspecified: Secondary | ICD-10-CM | POA: Diagnosis present

## 2014-01-17 DIAGNOSIS — I2510 Atherosclerotic heart disease of native coronary artery without angina pectoris: Secondary | ICD-10-CM | POA: Diagnosis present

## 2014-01-17 DIAGNOSIS — M109 Gout, unspecified: Secondary | ICD-10-CM | POA: Diagnosis present

## 2014-01-17 DIAGNOSIS — Z96659 Presence of unspecified artificial knee joint: Secondary | ICD-10-CM | POA: Diagnosis not present

## 2014-01-17 DIAGNOSIS — J45909 Unspecified asthma, uncomplicated: Secondary | ICD-10-CM | POA: Diagnosis present

## 2014-01-17 DIAGNOSIS — I152 Hypertension secondary to endocrine disorders: Secondary | ICD-10-CM | POA: Diagnosis present

## 2014-01-17 DIAGNOSIS — I252 Old myocardial infarction: Secondary | ICD-10-CM

## 2014-01-17 DIAGNOSIS — IMO0002 Reserved for concepts with insufficient information to code with codable children: Secondary | ICD-10-CM

## 2014-01-17 DIAGNOSIS — I10 Essential (primary) hypertension: Secondary | ICD-10-CM | POA: Diagnosis present

## 2014-01-17 DIAGNOSIS — R55 Syncope and collapse: Secondary | ICD-10-CM | POA: Diagnosis present

## 2014-01-17 DIAGNOSIS — E1159 Type 2 diabetes mellitus with other circulatory complications: Secondary | ICD-10-CM | POA: Diagnosis present

## 2014-01-17 DIAGNOSIS — E1169 Type 2 diabetes mellitus with other specified complication: Secondary | ICD-10-CM | POA: Diagnosis present

## 2014-01-17 DIAGNOSIS — IMO0001 Reserved for inherently not codable concepts without codable children: Secondary | ICD-10-CM | POA: Diagnosis present

## 2014-01-17 DIAGNOSIS — E119 Type 2 diabetes mellitus without complications: Secondary | ICD-10-CM | POA: Diagnosis present

## 2014-01-17 DIAGNOSIS — M069 Rheumatoid arthritis, unspecified: Secondary | ICD-10-CM

## 2014-01-17 HISTORY — DX: Cerebral infarction, unspecified: I63.9

## 2014-01-17 LAB — BASIC METABOLIC PANEL
BUN: 20 mg/dL (ref 6–23)
CO2: 24 mEq/L (ref 19–32)
Calcium: 9.7 mg/dL (ref 8.4–10.5)
Chloride: 98 mEq/L (ref 96–112)
Creatinine, Ser: 0.66 mg/dL (ref 0.50–1.10)
GFR calc Af Amer: >90 mL/min (ref >90)
GFR calc non Af Amer: 81 mL/min — ABNORMAL LOW (ref >90)
Glucose, Bld: 151 mg/dL — ABNORMAL HIGH (ref 70–99)
Potassium: 3.6 mEq/L — ABNORMAL LOW (ref 3.7–5.3)
Sodium: 137 mEq/L (ref 137–147)

## 2014-01-17 LAB — URINALYSIS, ROUTINE W REFLEX MICROSCOPIC
Bilirubin Urine: NEGATIVE
Glucose, UA: NEGATIVE mg/dL
Hgb urine dipstick: NEGATIVE
Ketones, ur: NEGATIVE mg/dL
Leukocytes, UA: NEGATIVE
Nitrite: NEGATIVE
Protein, ur: NEGATIVE mg/dL
Specific Gravity, Urine: 1.018 (ref 1.005–1.030)
Urobilinogen, UA: 0.2 mg/dL (ref 0.0–1.0)
pH: 6 (ref 5.0–8.0)

## 2014-01-17 LAB — CREATININE, SERUM
Creatinine, Ser: 0.64 mg/dL (ref 0.50–1.10)
GFR calc Af Amer: >90 mL/min (ref >90)
GFR calc non Af Amer: 81 mL/min — ABNORMAL LOW (ref >90)

## 2014-01-17 LAB — CBC WITH DIFFERENTIAL/PLATELET
Basophils Absolute: 0.1 10*3/uL (ref 0.0–0.1)
Basophils Relative: 1 % (ref 0–1)
Eosinophils Absolute: 0.8 10*3/uL — ABNORMAL HIGH (ref 0.0–0.7)
Eosinophils Relative: 7 % — ABNORMAL HIGH (ref 0–5)
HCT: 31.5 % — ABNORMAL LOW (ref 36.0–46.0)
Hemoglobin: 10.6 g/dL — ABNORMAL LOW (ref 12.0–15.0)
Lymphocytes Relative: 18 % (ref 12–46)
Lymphs Abs: 2 10*3/uL (ref 0.7–4.0)
MCH: 29 pg (ref 26.0–34.0)
MCHC: 33.7 g/dL (ref 30.0–36.0)
MCV: 86.1 fL (ref 78.0–100.0)
Monocytes Absolute: 0.9 10*3/uL (ref 0.1–1.0)
Monocytes Relative: 8 % (ref 3–12)
Neutro Abs: 7.4 10*3/uL (ref 1.7–7.7)
Neutrophils Relative %: 67 % (ref 43–77)
Platelets: 329 10*3/uL (ref 150–400)
RBC: 3.66 MIL/uL — ABNORMAL LOW (ref 3.87–5.11)
RDW: 17.4 % — ABNORMAL HIGH (ref 11.5–15.5)
WBC: 11.1 10*3/uL — ABNORMAL HIGH (ref 4.0–10.5)

## 2014-01-17 LAB — GLUCOSE, CAPILLARY
Glucose-Capillary: 130 mg/dL — ABNORMAL HIGH (ref 70–99)
Glucose-Capillary: 165 mg/dL — ABNORMAL HIGH (ref 70–99)

## 2014-01-17 LAB — CBC
HCT: 31.4 % — ABNORMAL LOW (ref 36.0–46.0)
Hemoglobin: 10.6 g/dL — ABNORMAL LOW (ref 12.0–15.0)
MCH: 29 pg (ref 26.0–34.0)
MCHC: 33.8 g/dL (ref 30.0–36.0)
MCV: 85.8 fL (ref 78.0–100.0)
Platelets: 321 10*3/uL (ref 150–400)
RBC: 3.66 MIL/uL — ABNORMAL LOW (ref 3.87–5.11)
RDW: 17.2 % — ABNORMAL HIGH (ref 11.5–15.5)
WBC: 10.3 10*3/uL (ref 4.0–10.5)

## 2014-01-17 LAB — TSH: TSH: 0.957 u[IU]/mL (ref 0.350–4.500)

## 2014-01-17 LAB — TROPONIN I
Troponin I: <0.3 ng/mL (ref <0.30)
Troponin I: <0.3 ng/mL (ref <0.30)
Troponin I: <0.3 ng/mL (ref <0.30)

## 2014-01-17 LAB — PRO B NATRIURETIC PEPTIDE: Pro B Natriuretic peptide (BNP): 169.1 pg/mL (ref 0–450)

## 2014-01-17 MED ORDER — DICLOFENAC SODIUM 1 % TD GEL
2.0000 g | Freq: Two times a day (BID) | TRANSDERMAL | Status: DC | PRN
  Administered 2014-01-19 – 2014-01-21 (×4): 2 g via TOPICAL
  Filled 2014-01-17: qty 100

## 2014-01-17 MED ORDER — TRIAMCINOLONE ACETONIDE 0.1 % EX CREA
1.0000 "application " | TOPICAL_CREAM | Freq: Two times a day (BID) | CUTANEOUS | Status: DC | PRN
  Filled 2014-01-17: qty 15

## 2014-01-17 MED ORDER — NAPROXEN SODIUM 220 MG PO TABS: 220.0000 mg | ORAL_TABLET | Freq: Every day | ORAL | Status: DC | PRN

## 2014-01-17 MED ORDER — INSULIN ASPART 100 UNIT/ML ~~LOC~~ SOLN
0.0000 [IU] | SUBCUTANEOUS | Status: DC
  Administered 2014-01-18 – 2014-01-19 (×3): 2 [IU] via SUBCUTANEOUS

## 2014-01-17 MED ORDER — ALBUTEROL SULFATE (2.5 MG/3ML) 0.083% IN NEBU: 2.5000 mg | INHALATION_SOLUTION | Freq: Four times a day (QID) | RESPIRATORY_TRACT | Status: DC | PRN

## 2014-01-17 MED ORDER — NAPROXEN 250 MG PO TABS
250.0000 mg | ORAL_TABLET | Freq: Every day | ORAL | Status: DC | PRN
  Administered 2014-01-17: 250 mg via ORAL
  Filled 2014-01-17 (×2): qty 1

## 2014-01-17 MED ORDER — CALCIUM 1200-1000 MG-UNIT PO CHEW: 1.0000 | CHEWABLE_TABLET | ORAL | Status: DC

## 2014-01-17 MED ORDER — COLCHICINE 0.6 MG PO TABS
0.6000 mg | ORAL_TABLET | Freq: Every day | ORAL | Status: DC
  Administered 2014-01-18 – 2014-01-22 (×5): 0.6 mg via ORAL
  Filled 2014-01-17 (×6): qty 1

## 2014-01-17 MED ORDER — SENNOSIDES-DOCUSATE SODIUM 8.6-50 MG PO TABS
1.0000 | ORAL_TABLET | Freq: Every evening | ORAL | Status: DC | PRN
  Filled 2014-01-17: qty 1

## 2014-01-17 MED ORDER — ENOXAPARIN SODIUM 40 MG/0.4ML ~~LOC~~ SOLN
40.0000 mg | SUBCUTANEOUS | Status: DC
  Administered 2014-01-18 – 2014-01-22 (×5): 40 mg via SUBCUTANEOUS
  Filled 2014-01-17 (×6): qty 0.4

## 2014-01-17 MED ORDER — NAPROXEN 250 MG PO TABS: 250.0000 mg | ORAL_TABLET | Freq: Every day | ORAL | Status: DC | PRN

## 2014-01-17 MED ORDER — ALBUTEROL SULFATE HFA 108 (90 BASE) MCG/ACT IN AERS: 2.0000 | INHALATION_SPRAY | Freq: Four times a day (QID) | RESPIRATORY_TRACT | Status: DC | PRN

## 2014-01-17 MED ORDER — POLYVINYL ALCOHOL 1.4 % OP SOLN
1.0000 [drp] | OPHTHALMIC | Status: DC | PRN
  Filled 2014-01-17: qty 15

## 2014-01-17 MED ORDER — CALCIUM CARBONATE ANTACID 500 MG PO CHEW
200.0000 mg | CHEWABLE_TABLET | Freq: Two times a day (BID) | ORAL | Status: DC
  Administered 2014-01-17 – 2014-01-22 (×10): 200 mg via ORAL
  Filled 2014-01-17 (×14): qty 1

## 2014-01-17 NOTE — ED Notes (Signed)
Pt getting portable xray

## 2014-01-17 NOTE — ED Notes (Addendum)
Dr. Wofford at bedside 

## 2014-01-17 NOTE — H&P (Signed)
[facility] History and Physical  [name] [name]:[id number] DOB: [birth year] DOA: 01/17/2014  Referring physician:  PCP: Oneal Grout, MD  Specialists:   Chief Complaint: Syncope vs seizure  HPI: [name] [name] 80 yo BF PMHx  fibromyalgia, Hx MI, Hx TIA, RAD, HTN, HLD, Diabetes type 2 presenting after an episode of loss of consciousness. I spoke with her caregiver via telephone, he states that the patient was having a normal conversation when she suddenly stiffened. She then slumped over into her chair and her eyes rolled back in her head. She then began having some shaking of her legs. She was reportedly confused for a short period of time after the episode. Review of patient's record shows that patient was seen 12/23/2013 for similar event (syncope vs seizure), however was not admitted. CT of head without contrast 3/11 showed chronic microvascular ischemia, no acute abnormality.     Review of Systems: The patient denies anorexia, fever, weight loss,, vision loss, decreased hearing, hoarseness, chest pain, syncope, dyspnea on exertion, peripheral edema, balance deficits, hemoptysis, abdominal pain, melena, hematochezia, severe indigestion/heartburn, hematuria, incontinence, genital sores, muscle weakness, suspicious skin lesions, transient blindness, difficulty walking, depression, unusual weight change, abnormal bleeding, enlarged lymph nodes, angioedema, and breast masses.    TRAVEL HISTORY:   Procedure CT head without contrast 3/ 09/2014 Chronic microvascular ischemia. No acute abnormality.  Chronic sinusitis.  PCXR 01/17/2014 Cardiac shadow is enlarged but stable. The lungs are well aerated  bilaterally. Degenerative changes of the shoulder joints are seen.  No acute bony abnormality is noted.   Antibiotics   Consultation    Past Medical History  Diagnosis Date  . Hypertension   . First degree atrioventricular block   . Onychia and paronychia of toe   .  Muscle weakness (generalized)   . Allergic rhinitis due to pollen   . Gout, unspecified   . Disorder of bone and cartilage, unspecified   . Edema   . Hyperlipidemia   . Unspecified essential hypertension   . Coronary atherosclerosis of native coronary artery   . Other malaise and fatigue   . Cough   . Other fall   . Unspecified vitamin D deficiency   . TIA (transient ischemic attack)     "a few one summer" (08/07/2013)  . Myocardial infarction ~ 1970  . Exertional shortness of breath     "sometimes" (08/07/2013)  . Type II diabetes mellitus     "fasting 90-110s" (08/07/2013)  . Osteoarthrosis, unspecified whether generalized or localized, unspecified site   . Prepatellar bursitis   . Arthritis     "in q joint" (08/07/2013)   Past Surgical History  Procedure Laterality Date  . Tonsillectomy  08/1974  . Replacement total knee Right 09/2005  . Shoulder open rotator cuff repair Right 07/1999  . Appendectomy  1960  . Abdominal hysterectomy  04/1980  . Transthoracic echocardiogram  2003    EF 55-65%; mild concentric LVH  . Joint replacement    . Total knee arthroplasty Left 08/07/2013  . Cataract extraction w/ intraocular lens  implant, bilateral Bilateral ~ 2012  . Total knee arthroplasty Left 08/07/2013    Procedure: TOTAL KNEE ARTHROPLASTY- LEFT;  Surgeon: Dannielle Huh, MD;  Location: [facility] OR;  Service: Orthopedics;  Laterality: Left;  . Cardiac catheterization  2003   Social History:  reports that she has never smoked. She has never used smokeless tobacco. She reports that she does not drink alcohol or use illicit drugs. where does patient live--home, ALF,  SNF? and with whom if at home?  Can patient participate in ADLs?  Allergies  Allergen Reactions  . Cetirizine Hcl Other (See Comments)    unknown  . Codeine Other (See Comments)    unknown  . Fish Allergy   . Indomethacin Other (See Comments)    unknown  . Lyrica [Pregabalin]     hallucinations  . Methyldopa Other (See  Comments)    unknown  . Shellfish Allergy Hives  . Tomato Rash    Family History  Problem Relation Age of Onset  . Heart attack Father      Prior to Admission medications   Medication Sig Start Date End Date Taking? Authorizing Provider  albuterol (PROVENTIL HFA;VENTOLIN HFA) 108 (90 BASE) MCG/ACT inhaler Inhale 2 puffs into the lungs every 6 (six) hours as needed for wheezing. 04/19/13  Yes Mahima Pandey, MD  allopurinol (ZYLOPRIM) 100 MG tablet Take 100 mg by mouth daily. For gout   Yes Historical Provider, MD  aspirin 81 MG tablet Take 81 mg by mouth daily.   Yes Historical Provider, MD  Calcium 1200-1000 MG-UNIT CHEW Chew 1 tablet by mouth every other day. 10/18/13  Yes Mahima Glade Lloyd, MD  colchicine 0.6 MG tablet Take 1 tablet (0.6 mg total) by mouth daily. 1 by mouth daily for Gout 01/16/14  Yes Mahima Pandey, MD  diclofenac sodium (VOLTAREN) 1 % GEL Apply 2 g topically 2 (two) times daily as needed (for pain).   Yes Historical Provider, MD  hydrALAZINE (APRESOLINE) 25 MG tablet Take one tablet by mouth three times daily to control blood pressure 10/24/13  Yes Mahima Pandey, MD  isosorbide mononitrate (IMDUR) 30 MG 24 hr tablet Take 30 mg by mouth daily.    Yes Historical Provider, MD  metFORMIN (GLUCOPHAGE) 500 MG tablet Take 500 mg by mouth daily with breakfast. For diabetes   Yes Historical Provider, MD  metoprolol succinate (TOPROL-XL) 100 MG 24 hr tablet Take 100 mg by mouth daily. Take with or immediately following a meal.   Yes Historical Provider, MD  naproxen sodium (ANAPROX) 220 MG tablet Take 220 mg by mouth daily as needed (for pain).   Yes Historical Provider, MD  oxyCODONE (OXY IR/ROXICODONE) 5 MG immediate release tablet Take 1-2 tablets (5-10 mg total) by mouth every 8 (eight) hours as needed. 01/16/14  Yes Mahima Glade Lloyd, MD  polyvinyl alcohol (LIQUIFILM TEARS) 1.4 % ophthalmic solution Place 1 drop into both eyes as needed (for dry eyes).   Yes Historical Provider, MD   predniSONE (DELTASONE) 10 MG tablet Take 1 tablet (10 mg total) by mouth daily with breakfast. 01/16/14  Yes Mahima Pandey, MD  triamcinolone cream (KENALOG) 0.1 % Apply 1 application topically 2 (two) times daily as needed (to affected area).   Yes Historical Provider, MD  valsartan-hydrochlorothiazide (DIOVAN-HCT) 160-25 MG per tablet Take 1 tablet by mouth daily. For blood pressure   Yes Historical Provider, MD  verapamil (CALAN-SR) 240 MG CR tablet Take 240 mg by mouth daily. For blood pressure   Yes Historical Provider, MD  VITAMIN D, ERGOCALCIFEROL, PO Take 1 tablet by mouth every other day.    Yes Historical Provider, MD   Physical Exam: Filed Vitals:   01/17/14 1000 01/17/14 1030 01/17/14 1130 01/17/14 1200  BP: 153/78 136/81 158/77 162/80  Pulse: 81 83    Temp:      TempSrc:      Resp: 16 24 15 14   SpO2: 99% 96%       General:  A./O. x4, NAD  Eyes: Pupils equal round reactive to light and accommodation  Neck: Negative lymphadenopathy, negative JVD  Cardiovascular: Regular rhythm and rate, negative murmurs rubs or gallops  Respiratory: Clear to ossification bilateral  Abdomen: Soft, nontender, nondistended, plus bowel side  Skin: Negative lacerations/lesions  Musculoskeletal: Negative pedal edema  Neurologic: Cranial nerves II through XII intact, uvula/tongue midline, quick finger touch is normal, ambulates with a dragging of the right leg (normal for patient's since stroke), negative pronator drift, negative Romberg, extremity strength 5/5, sensation intact throughout  Labs on Admission:  Basic Metabolic Panel:  Recent Labs Lab 01/12/14 1148 01/17/14 1020  NA 137 137  K 3.8 3.6*  CL 97 98  CO2 21 24  GLUCOSE 124* 151*  BUN 16 20  CREATININE 0.78 0.66  CALCIUM 10.4* 9.7   Liver Function Tests:  Recent Labs Lab 01/12/14 1148  AST 16  ALT 12  ALKPHOS 85  BILITOT 0.4  PROT 7.4   No results found for this basename: LIPASE, AMYLASE,  in the last 168  hours No results found for this basename: AMMONIA,  in the last 168 hours CBC:  Recent Labs Lab 01/12/14 1148 01/17/14 1020  WBC 10.9* 11.1*  NEUTROABS 6.8 7.4  HGB 11.3 10.6*  HCT 33.3* 31.5*  MCV 83 86.1  PLT  --  329   Cardiac Enzymes:  Recent Labs Lab 01/17/14 1020  TROPONINI <0.30    BNP (last 3 results) No results found for this basename: PROBNP,  in the last 8760 hours CBG: No results found for this basename: GLUCAP,  in the last 168 hours  Radiological Exams on Admission: Ct Head Wo Contrast  01/17/2014   CLINICAL DATA Syncope  EXAM CT HEAD WITHOUT CONTRAST  TECHNIQUE Contiguous axial images were obtained from the base of the skull through the vertex without intravenous contrast.  COMPARISON CT 11/14/2006  FINDINGS Mild chronic microvascular ischemic change has progressed in the interval. Mild atrophy is similar. Ventricle size is normal.  Negative for acute infarct.  Negative for hemorrhage or mass.  Chronic sinusitis.  IMPRESSION Chronic microvascular ischemia.  No acute abnormality.  Chronic sinusitis.  SIGNATURE  Electronically Signed   By: Marlan Palau [name]   On: 01/17/2014 10:53   Dg Chest Port 1 View  01/17/2014   CLINICAL DATA Recent seizure activity  EXAM PORTABLE CHEST - 1 VIEW  COMPARISON 12/23/2013  FINDINGS Cardiac shadow is enlarged but stable. The lungs are well aerated bilaterally. Degenerative changes of the shoulder joints are seen. No acute bony abnormality is noted.  IMPRESSION No acute abnormality noted.  SIGNATURE  Electronically Signed   By: Alcide Clever [name]   On: 01/17/2014 10:38    EKG: NSR, inferior infarct age undetermined   Assessment/Plan Principal Problem:   Syncope and collapse Active Problems:   DIABETES MELLITUS, TYPE II   HYPERLIPIDEMIA   HYPERTENSION   Reactive airway disease   Fibromyalgia   HTN (hypertension)   Stroke   Seizures   Syncope -Rule out CVA -Rule out iatrogenic syncope secondary to medication -Rule out  MI obtain serial troponin and proBNP -Rule out seizures -Rule out arrhythmia (echocardiogram pending)  Seizures -Obtain EEG -If EEG abnormal consult neurology  CVA -History CVA 2002 per patient/daughter -Patient with right lower extremity weakness at baseline  Diabetes mellitus type 2 -Control with moderate SSI -Obtain hemoglobin A1c  HTN -Hold all BP medication; syncope secondary to orthostasis? (Verapamil 240 mg daily, hydralazine 25 mg TID, Imdur 30  mg daily, metoprolol 100 mg daily, Diovan /HCT 160 -25 mg daily)   -Orthostatic vitals every shift  HLD -Obtain lipid panel     Code Status: Full Family Communication: Daughter present Disposition Plan: Resolution of syncope/seizure  Time spent: 90 minutes Drema [place] [facility] Pager [phone]  If 7PM-7AM, please contact night-coverage [email or web address] Password [email or web address] 01/17/2014, 1:17 PM

## 2014-01-17 NOTE — ED Notes (Signed)
Pt returned from xray

## 2014-01-17 NOTE — ED Notes (Signed)
Pt gotten up to bedside commode to give urine sample. Urine obtained then spilled. Dr. Loretha Stapler aware.

## 2014-01-17 NOTE — ED Provider Notes (Signed)
CSN: [id number]     Arrival date & time 01/17/14  0912 History   First MD Initiated Contact with Patient 01/17/14 [phone]     Chief Complaint  Patient presents with  . Loss of Consciousness  . Weakness     (Consider location/radiation/quality/duration/timing/severity/associated sxs/prior Treatment) HPI Comments: 80 year old female presenting after an episode of loss of consciousness. I spoke with her caregiver via telephone, he states that the patient was having a normal conversation when she suddenly stiffened. She then slumped over into her chair and her eyes rolled back in her head. She then began having some shaking of her legs. She was reportedly confused for a short period of time after the episode.  Patient is a 80 y.o. female presenting with syncope.  Loss of Consciousness Episode history:  Single Most recent episode:  Today Duration: A few minutes. Timing:  Constant Progression:  Resolved Chronicity:  Recurrent (Similar episode one month ago) Context comment:  While sitting at the breakfast table. Had sat down approximately 5 minutes prior. Witnessed: yes   Relieved by:  Nothing Worsened by:  Nothing tried Associated symptoms: diaphoresis, dizziness (Does not recall dizziness just prior,but felt some dizziness the night before.) and weakness   Associated symptoms: no chest pain, no difficulty breathing, no fever, no nausea, no shortness of breath and no vomiting     Past Medical History  Diagnosis Date  . Hypertension   . First degree atrioventricular block   . Onychia and paronychia of toe   . Muscle weakness (generalized)   . Allergic rhinitis due to pollen   . Gout, unspecified   . Disorder of bone and cartilage, unspecified   . Edema   . Hyperlipidemia   . Unspecified essential hypertension   . Coronary atherosclerosis of native coronary artery   . Other malaise and fatigue   . Cough   . Other fall   . Unspecified vitamin D deficiency   . TIA (transient  ischemic attack)     "a few one summer" (08/07/2013)  . Myocardial infarction ~ 1970  . Exertional shortness of breath     "sometimes" (08/07/2013)  . Type II diabetes mellitus     "fasting 90-110s" (08/07/2013)  . Osteoarthrosis, unspecified whether generalized or localized, unspecified site   . Prepatellar bursitis   . Arthritis     "in q joint" (08/07/2013)   Past Surgical History  Procedure Laterality Date  . Tonsillectomy  08/1974  . Replacement total knee Right 09/2005  . Shoulder open rotator cuff repair Right 07/1999  . Appendectomy  1960  . Abdominal hysterectomy  04/1980  . Transthoracic echocardiogram  2003    EF 55-65%; mild concentric LVH  . Joint replacement    . Total knee arthroplasty Left 08/07/2013  . Cataract extraction w/ intraocular lens  implant, bilateral Bilateral ~ 2012  . Total knee arthroplasty Left 08/07/2013    Procedure: TOTAL KNEE ARTHROPLASTY- LEFT;  Surgeon: Dannielle Huh, MD;  Location: [facility] OR;  Service: Orthopedics;  Laterality: Left;  . Cardiac catheterization  2003   Family History  Problem Relation Age of Onset  . Heart attack Father    History  Substance Use Topics  . Smoking status: Never Smoker   . Smokeless tobacco: Never Used  . Alcohol Use: No   OB History   Grav Para Term Preterm Abortions TAB SAB Ect Mult Living                 Review of Systems  Constitutional: Positive for diaphoresis. Negative for fever.  HENT: Negative for congestion.   Respiratory: Negative for cough and shortness of breath.   Cardiovascular: Positive for syncope. Negative for chest pain.  Gastrointestinal: Negative for nausea, vomiting, abdominal pain and diarrhea.  Neurological: Positive for dizziness (Does not recall dizziness just prior,but felt some dizziness the night before.) and weakness.  All other systems reviewed and are negative.      Allergies  Cetirizine hcl; Codeine; Fish allergy; Indomethacin; Lyrica; Methyldopa; Shellfish allergy; and  Tomato  Home Medications   Current Outpatient Rx  Name  Route  Sig  Dispense  Refill  . albuterol (PROVENTIL HFA;VENTOLIN HFA) 108 (90 BASE) MCG/ACT inhaler   Inhalation   Inhale 2 puffs into the lungs every 6 (six) hours as needed for wheezing.   1 Inhaler   0   . allopurinol (ZYLOPRIM) 100 MG tablet   Oral   Take 100 mg by mouth daily. For gout         . aspirin 81 MG tablet   Oral   Take 81 mg by mouth daily.         . Calcium 1200-1000 MG-UNIT CHEW   Oral   Chew 1 tablet by mouth daily.   30 each   11   . colchicine 0.6 MG tablet   Oral   Take 1 tablet (0.6 mg total) by mouth daily. 1 by mouth daily for Gout   90 tablet   1   . hydrALAZINE (APRESOLINE) 25 MG tablet      Take one tablet by mouth three times daily to control blood pressure   90 tablet   5   . isosorbide mononitrate (IMDUR) 30 MG 24 hr tablet   Oral   Take 45 mg by mouth daily.         . metFORMIN (GLUCOPHAGE) 500 MG tablet   Oral   Take 500 mg by mouth daily with breakfast. For diabetes         . metoprolol succinate (TOPROL-XL) 100 MG 24 hr tablet   Oral   Take 100 mg by mouth daily. Take with or immediately following a meal.         . OVER THE COUNTER MEDICATION   Oral   Take 1 tablet by mouth daily. Wellness Formula herbal vitamin         . OVER THE COUNTER MEDICATION   Oral   Take 1 tablet by mouth daily. Flexim--joint supplement         . oxyCODONE (OXY IR/ROXICODONE) 5 MG immediate release tablet   Oral   Take 1-2 tablets (5-10 mg total) by mouth every 8 (eight) hours as needed.   90 tablet   0   . polyvinyl alcohol (LIQUIFILM TEARS) 1.4 % ophthalmic solution   Both Eyes   Place 1 drop into both eyes as needed (for dry eyes).         . predniSONE (DELTASONE) 10 MG tablet   Oral   Take 1 tablet (10 mg total) by mouth daily with breakfast.   90 tablet   1   . triamcinolone cream (KENALOG) 0.1 %      APPLY TO AFFECTED AREA TWICE DAILY AS NEEDED   45 g    1   . valsartan-hydrochlorothiazide (DIOVAN-HCT) 160-25 MG per tablet   Oral   Take 1 tablet by mouth daily. For blood pressure         . verapamil (VERELAN PM) 240 MG  24 hr capsule      TAKE 1 CAPSULE BY MOUTH EVERY DAY FOR BLOOD PRESSURE   30 capsule   3   . VITAMIN D, ERGOCALCIFEROL, PO   Oral   Take 1 tablet by mouth daily.         . VOLTAREN 1 % GEL      APPLY TOPICALLY 3 TO 4 TIMES DAILY.   100 g   1    BP 155/82  Pulse 81  Temp(Src) 97.8 F (36.6 C) (Oral)  Resp 20  SpO2 97% Physical Exam  Nursing note and vitals reviewed. Constitutional: She is oriented to person, place, and time. She appears well-developed and well-nourished. No distress.  HENT:  Head: Normocephalic and atraumatic.  Mouth/Throat: Oropharynx is clear and moist.  Eyes: Conjunctivae are normal. Pupils are equal, round, and reactive to light. No scleral icterus.  Neck: Neck supple.  Cardiovascular: Normal rate, regular rhythm, normal heart sounds and intact distal pulses.   No murmur heard. Pulmonary/Chest: Effort normal and breath sounds normal. No stridor. No respiratory distress. She has no rales.  Abdominal: Soft. Bowel sounds are normal. She exhibits no distension. There is no tenderness.  Musculoskeletal: Normal range of motion.  Neurological: She is alert and oriented to person, place, and time. No cranial nerve deficit or sensory deficit. Coordination normal. Abnormal gait: not tested secondary to generalized weakness. GCS eye subscore is 4. GCS verbal subscore is 5. GCS motor subscore is 6.  Generalized weakness without focal weakness  Skin: Skin is warm and dry. No rash noted.  Psychiatric: She has a normal mood and affect. Her behavior is normal.    ED Course  Procedures (including critical care time) Labs Review All labs drawn in ED reviewed.   All ED Imaging Review    EKG Interpretation   Date/Time:  Wednesday January 17 2014 09:14:16 EDT Ventricular Rate:  81 PR  Interval:  197 QRS Duration: 76 QT Interval:  391 QTC Calculation: 454 R Axis:   -22 Text Interpretation:  Sinus rhythm Left ventricular hypertrophy Inferior  infarct, old No significant change was found Confirmed by [facility]  MD,  TREY ([id number]) on 01/17/2014 10:12:06 AM      MDM   Final diagnoses:  Syncope  80 year old female presenting after an episode of loss of consciousness. Syncope versus seizure.    ED workup unrevealing, but think pt needs admission for further workup.      Candyce Churn III, MD 01/18/14 [phone]

## 2014-01-17 NOTE — ED Notes (Signed)
Pt reports she felt weak during the night last night while getting up to the bathroom. REports feeling well yesterday and went to the doctor. This Am felt weak while getting up. Pt was sitting with home health aid eating breakfaast when pt had a syncopal event while sitting in the chair. LOC lasted approx 30sec. Pt awake, alert, oriented x4, VSS at present. Placed on monitor. 20g LFA. CBG 170.

## 2014-01-18 ENCOUNTER — Inpatient Hospital Stay (HOSPITAL_COMMUNITY): Payer: Medicare Other

## 2014-01-18 DIAGNOSIS — R55 Syncope and collapse: Secondary | ICD-10-CM

## 2014-01-18 DIAGNOSIS — I10 Essential (primary) hypertension: Secondary | ICD-10-CM

## 2014-01-18 DIAGNOSIS — I2510 Atherosclerotic heart disease of native coronary artery without angina pectoris: Secondary | ICD-10-CM

## 2014-01-18 LAB — HEMOGLOBIN A1C
Hgb A1c MFr Bld: 7.1 % — ABNORMAL HIGH (ref <5.7)
Mean Plasma Glucose: 157 mg/dL — ABNORMAL HIGH (ref <117)

## 2014-01-18 LAB — GLUCOSE, CAPILLARY
Glucose-Capillary: 90 mg/dL (ref 70–99)
Glucose-Capillary: 113 mg/dL — ABNORMAL HIGH (ref 70–99)
Glucose-Capillary: 103 mg/dL — ABNORMAL HIGH (ref 70–99)
Glucose-Capillary: 137 mg/dL — ABNORMAL HIGH (ref 70–99)
Glucose-Capillary: 111 mg/dL — ABNORMAL HIGH (ref 70–99)

## 2014-01-18 LAB — TROPONIN I: Troponin I: <0.3 ng/mL (ref <0.30)

## 2014-01-18 LAB — LIPID PANEL
Cholesterol: 171 mg/dL (ref 0–200)
HDL: 71 mg/dL (ref >39)
LDL Cholesterol: 77 mg/dL (ref 0–99)
Total CHOL/HDL Ratio: 2.4 RATIO
Triglycerides: 117 mg/dL (ref <150)
VLDL: 23 mg/dL (ref 0–40)

## 2014-01-18 MED ORDER — ASPIRIN EC 81 MG PO TBEC
81.0000 mg | DELAYED_RELEASE_TABLET | Freq: Every day | ORAL | Status: DC
  Administered 2014-01-18 – 2014-01-22 (×5): 81 mg via ORAL
  Filled 2014-01-18 (×5): qty 1

## 2014-01-18 MED ORDER — HYDRALAZINE HCL 25 MG PO TABS
25.0000 mg | ORAL_TABLET | Freq: Once | ORAL | Status: AC
  Administered 2014-01-18: 25 mg via ORAL
  Filled 2014-01-18: qty 1

## 2014-01-18 MED ORDER — HYDRALAZINE HCL 50 MG PO TABS
50.0000 mg | ORAL_TABLET | Freq: Three times a day (TID) | ORAL | Status: DC
  Administered 2014-01-18 – 2014-01-19 (×2): 50 mg via ORAL
  Filled 2014-01-18 (×5): qty 1

## 2014-01-18 MED ORDER — ASPIRIN 81 MG PO TABS: 81.0000 mg | ORAL_TABLET | Freq: Every day | ORAL | Status: DC

## 2014-01-18 MED ORDER — METOPROLOL SUCCINATE ER 100 MG PO TB24
100.0000 mg | ORAL_TABLET | Freq: Every day | ORAL | Status: DC
  Filled 2014-01-18: qty 1

## 2014-01-18 MED ORDER — TRAMADOL HCL 50 MG PO TABS
50.0000 mg | ORAL_TABLET | Freq: Four times a day (QID) | ORAL | Status: DC | PRN
  Administered 2014-01-18: 100 mg via ORAL
  Administered 2014-01-19 – 2014-01-21 (×4): 50 mg via ORAL
  Administered 2014-01-21: 100 mg via ORAL
  Administered 2014-01-22: 50 mg via ORAL
  Filled 2014-01-18 (×2): qty 1
  Filled 2014-01-18: qty 2
  Filled 2014-01-18: qty 1
  Filled 2014-01-18: qty 2
  Filled 2014-01-18 (×2): qty 1

## 2014-01-18 MED ORDER — HYDRALAZINE HCL 25 MG PO TABS
25.0000 mg | ORAL_TABLET | Freq: Three times a day (TID) | ORAL | Status: DC
  Administered 2014-01-18 (×2): 25 mg via ORAL
  Filled 2014-01-18 (×4): qty 1

## 2014-01-18 MED ORDER — METOPROLOL SUCCINATE ER 100 MG PO TB24
100.0000 mg | ORAL_TABLET | Freq: Every day | ORAL | Status: DC
  Administered 2014-01-18: 100 mg via ORAL
  Filled 2014-01-18 (×3): qty 1

## 2014-01-18 NOTE — Evaluation (Signed)
Physical Therapy Evaluation Patient Details Name: [name] [name] MRN: [id number] DOB: May 30, 1930 Today's Date: 01/18/2014 Time: 6789-3810 PT Time Calculation (min): 17 min  PT Assessment / Plan / Recommendation History of Present Illness  HPI: [name] [name] 80 yo BF PMHx  fibromyalgia, Hx MI, Hx TIA, RAD, HTN, HLD, Diabetes type 2 presenting after an episode of loss of consciousness. I spoke with her caregiver via telephone, he states that the patient was having a normal conversation when she suddenly stiffened. She then slumped over into her chair and her eyes rolled back in her head. She then began having some shaking of her legs. She was reportedly confused for a short period of time after the episode. Review of patient's record shows that patient was seen 12/23/2013 for similar event (syncope vs seizure), however was not admitted. CT of head without contrast 3/11 showed chronic microvascular ischemia, no acute abnormality.  Clinical Impression  Patient demonstrates deficits in mobility as indicated. Evaluation limited secondary to elevated BP. Will need skilled PT to address deficits and maximize function. Will see as indicated and progress as tolerated.     PT Assessment  Patient needs continued PT services    Follow Up Recommendations  No PT follow up;Supervision - Intermittent    Does the patient have the potential to tolerate intense rehabilitation      Barriers to Discharge        Equipment Recommendations  None recommended by PT    Recommendations for Other Services     Frequency Min 4X/week    Precautions / Restrictions Precautions Precautions: Fall Restrictions Weight Bearing Restrictions: No   Pertinent Vitals/Pain BP 188/113      Mobility  Bed Mobility Overal bed mobility: Needs Assistance Bed Mobility: Supine to Sit Supine to sit: Supervision General bed mobility comments: No physical assist required to come to EOB Transfers Overall transfer level:  Needs assistance Equipment used: Rolling walker (2 wheeled) Transfers: Sit to/from [employer] Sit to Stand: Min guard Stand pivot transfers: Min guard General transfer comment: VCs for proper hand placement (pt attempting to use lower rails of RW to stand) Ambulation/Gait General Gait Details: not assessed secondary to high BP    Exercises     PT Diagnosis: Difficulty walking;Abnormality of gait;Generalized weakness  PT Problem List: Decreased strength;Decreased range of motion;Decreased activity tolerance;Decreased balance;Decreased mobility;Obesity PT Treatment Interventions: DME instruction;Gait training;Functional mobility training;Therapeutic activities;Therapeutic exercise;Balance training;Patient/family education     PT Goals(Current goals can be found in the care plan section) Acute Rehab PT Goals Patient Stated Goal: to go home PT Goal Formulation: With patient/family Time For Goal Achievement: 02/01/14 Potential to Achieve Goals: Good  Visit Information  Last PT Received On: 01/18/14 Assistance Needed: +1 History of Present Illness: HPI: [name] [name] 80 yo BF PMHx  fibromyalgia, Hx MI, Hx TIA, RAD, HTN, HLD, Diabetes type 2 presenting after an episode of loss of consciousness. I spoke with her caregiver via telephone, he states that the patient was having a normal conversation when she suddenly stiffened. She then slumped over into her chair and her eyes rolled back in her head. She then began having some shaking of her legs. She was reportedly confused for a short period of time after the episode. Review of patient's record shows that patient was seen 12/23/2013 for similar event (syncope vs seizure), however was not admitted. CT of head without contrast 3/11 showed chronic microvascular ischemia, no acute abnormality.       Prior Functioning  Home Living Family/patient expects to be discharged to:: Private residence Living Arrangements:  Alone Available Help at Discharge: Family;Available 24 hours/day Type of Home: Independent living facility Home Access: Level entry Home Layout: One level Home Equipment: Hand held shower head;Walker - 4 wheels;Walker - 2 wheels;Cane - single point;Shower seat Prior Function Level of Independence: Independent Communication Communication: No difficulties Dominant Hand: Right    Cognition  Cognition Arousal/Alertness: Awake/alert Behavior During Therapy: WFL for tasks assessed/performed Overall Cognitive Status: Within Functional Limits for tasks assessed    Extremity/Trunk Assessment Upper Extremity Assessment Upper Extremity Assessment: Defer to OT evaluation Lower Extremity Assessment Lower Extremity Assessment: Generalized weakness (+ arthritic changes)   Balance General Comments General comments (skin integrity, edema, etc.): elevated BP limiting session  End of Session PT - End of Session Equipment Utilized During Treatment: Gait belt Activity Tolerance: Treatment limited secondary to medical complications (Comment) (elevated BP 188/113) Patient left: in chair;with call bell/phone within reach;with family/visitor present (family advised patient must have staff with her for [facility]) Nurse Communication: Mobility status  GP     Fabio Asa 01/18/2014, 3:02 PM Charlotte Crumb, PT DPT  [phone]

## 2014-01-18 NOTE — Progress Notes (Signed)
PT Cancellation Note  Patient Details Name: [name] [name] MRN: [id number] DOB: 1930/09/11   Cancelled Treatment:    Reason Eval/Treat Not Completed: Patient at procedure or test/unavailable   Fabio Asa 01/18/2014, 11:01 AM Charlotte Crumb, PT DPT  [phone]

## 2014-01-18 NOTE — Progress Notes (Signed)
EEG completed. Results pending

## 2014-01-18 NOTE — Progress Notes (Signed)
[facility] PROGRESS NOTE  [name] [name] GDJ:[id number] DOB: Aug 18, 1930 DOA: 01/17/2014 PCP: Oneal Grout, MD  Assessment/Plan: #1 syncope Questionable etiology. Orthostatics were not checked on admission and patient was on a multitude of antihypertensive medications which were held on admission. CT of the head is negative. MRI of the head is negative. Chest x-ray is negative for any acute infection. EEG has been done and is pending to rule out seizures. Preliminary carotid Dopplers with no significant stenosis. 2-D echo with no valvular abnormalities. Continue gentle hydration and supportive care. Follow.  #2 ??? Seizures MRI of the head negative. CT head negative. No significant electrolyte abnormalities. EEG has been done and is pending. Follow.  #3 history of CVA Patient with chronic right lower extremity weakness at baseline. Resume aspirin for secondary stroke prevention. PT/OT.  #4 hypertension Will resume patient's home dose Toprol. Resume hydralazine and increase to 50 mg 3 times daily. Follow and titrate as needed.  #5 diabetes mellitus type 2 Hemoglobin A1c 7.1. CBGs have ranged from 90-165. Continue sliding scale insulin.  #6 hyperlipidemia Fasting lipid panel with total cholesterol 171 and LDL of 77 and HDL of 71.  #7 prophylaxis Lovenox for DVT prophylaxis.  Code Status: Full Family Communication: Updated patient and daughter at bedside. Disposition Plan: Home when medically stable.   Consultants:  None  Procedures:  2-D echo 01/18/2014  MRI/MRA brain 01/17/2014  Chest x-ray 01/17/2014  CT head 01/17/2014  EEG 01/18/2014  Antibiotics:  None  HPI/Subjective: Patient states she's feeling better than she did when she came in. Patient complaining of right upper extremity pain which is chronic and usually intermittent.  Objective: Filed Vitals:   01/18/14 0543  BP: 189/100  Pulse: 103  Temp: 97.9 F (36.6 C)  Resp: 20   No intake or  output data in the 24 hours ending 01/18/14 1150 Filed Weights   01/17/14 1700  Weight: 89.359 kg (197 lb)    Exam:   General:  NAD  Cardiovascular: RRR  Respiratory: CTAB  Abdomen: Soft/NT/ND/+BS  Musculoskeletal: No c/c/e  Data Reviewed: Basic Metabolic Panel:  Recent Labs Lab 01/12/14 1148 01/17/14 1020 01/17/14 1307  NA 137 137  --   K 3.8 3.6*  --   CL 97 98  --   CO2 21 24  --   GLUCOSE 124* 151*  --   BUN 16 20  --   CREATININE 0.78 0.66 0.64  CALCIUM 10.4* 9.7  --    Liver Function Tests:  Recent Labs Lab 01/12/14 1148  AST 16  ALT 12  ALKPHOS 85  BILITOT 0.4  PROT 7.4   No results found for this basename: LIPASE, AMYLASE,  in the last 168 hours No results found for this basename: AMMONIA,  in the last 168 hours CBC:  Recent Labs Lab 01/12/14 1148 01/17/14 1020 01/17/14 1307  WBC 10.9* 11.1* 10.3  NEUTROABS 6.8 7.4  --   HGB 11.3 10.6* 10.6*  HCT 33.3* 31.5* 31.4*  MCV 83 86.1 85.8  PLT  --  329 321   Cardiac Enzymes:  Recent Labs Lab 01/17/14 1020 01/17/14 1307 01/17/14 2034 01/18/14 0043  TROPONINI <0.30 <0.30 <0.30 <0.30   BNP (last 3 results)  Recent Labs  01/17/14 1307  PROBNP 169.1   CBG:  Recent Labs Lab 01/17/14 1803 01/17/14 2137 01/18/14 0057 01/18/14 0518  GLUCAP 130* 165* 90 113*    No results found for this or any previous visit (from the past 240 hour(s)).  Studies: Dg Chest 2 View  01/17/2014   CLINICAL DATA Stroke-like stent  EXAM CHEST  2 VIEW  COMPARISON 01/17/1949 1018 hrs  FINDINGS Cardiac shadow is stable. The lungs are well aerated bilaterally. No focal infiltrate or sizable effusion is seen. No acute bony abnormality is noted.  IMPRESSION No acute abnormality seen.  SIGNATURE  Electronically Signed   By: Alcide Clever [name]   On: 01/17/2014 14:31   Ct Head Wo Contrast  01/17/2014   CLINICAL DATA Syncope  EXAM CT HEAD WITHOUT CONTRAST  TECHNIQUE Contiguous axial images were obtained from the  base of the skull through the vertex without intravenous contrast.  COMPARISON CT 11/14/2006  FINDINGS Mild chronic microvascular ischemic change has progressed in the interval. Mild atrophy is similar. Ventricle size is normal.  Negative for acute infarct.  Negative for hemorrhage or mass.  Chronic sinusitis.  IMPRESSION Chronic microvascular ischemia.  No acute abnormality.  Chronic sinusitis.  SIGNATURE  Electronically Signed   By: Marlan Palau [name]   On: 01/17/2014 10:53   Mr Brain Wo Contrast  01/17/2014   CLINICAL DATA Syncopal episode.  Weakness.  EXAM MRI HEAD WITHOUT CONTRAST  MRA HEAD WITHOUT CONTRAST  TECHNIQUE Multiplanar, multiecho pulse sequences of the brain and surrounding structures were obtained without intravenous contrast. Angiographic images of the head were obtained using MRA technique without contrast.  COMPARISON CT HEAD W/O CM dated 01/17/2014; MR HEAD WO/W CM dated 04/15/2005  FINDINGS MRI HEAD FINDINGS  The diffusion-weighted images demonstrate no evidence for acute or subacute infarction. Mild generalized atrophy is present. There is slight progression of periventricular white matter disease bilaterally. The ventricles are proportionate to the degree of atrophy. No significant extra-axial fluid collections are present.  Flow is present in the major intracranial arteries. The patient is status post bilateral lens extractions. The paranasal sinuses and mastoid air cells are clear. A heterogeneous nodule is present in the nasopharynx measuring 9 x 12 x 14 mm and increased in size since the prior study.  MRA HEAD FINDINGS  The internal carotid arteries demonstrate minimal irregularity through the cavernous segments bilaterally. The A1 and M1 segments are normal. The MCA bifurcations are within normal limits. There is some attenuation of distal MCA branch vessels bilaterally, left greater than right. No definite anterior communicating artery is evident.  The vertebral arteries are  codominant. The PICA origins are visualized and normal. The basilar artery is within normal limits. Both posterior cerebral arteries originate from the basilar tip. The PCA branch vessels are intact.  IMPRESSION 1. Slight progression of periventricular and subcortical white matter change. This likely reflects the sequelae of chronic microvascular ischemia. 2. No acute intracranial abnormality. 3. Mild to moderate distal small vessel disease is most evident within the left MCA territory. 4. No significant proximal stenosis, aneurysm, or branch vessel occlusion.  SIGNATURE  Electronically Signed   By: Gennette Pac [name]   On: 01/17/2014 15:57   Dg Chest Port 1 View  01/17/2014   CLINICAL DATA Recent seizure activity  EXAM PORTABLE CHEST - 1 VIEW  COMPARISON 12/23/2013  FINDINGS Cardiac shadow is enlarged but stable. The lungs are well aerated bilaterally. Degenerative changes of the shoulder joints are seen. No acute bony abnormality is noted.  IMPRESSION No acute abnormality noted.  SIGNATURE  Electronically Signed   By: Alcide Clever [name]   On: 01/17/2014 10:38   Mr Maxine Glenn Head/brain Wo Cm  01/17/2014   CLINICAL DATA Syncopal episode.  Weakness.  EXAM MRI  HEAD WITHOUT CONTRAST  MRA HEAD WITHOUT CONTRAST  TECHNIQUE Multiplanar, multiecho pulse sequences of the brain and surrounding structures were obtained without intravenous contrast. Angiographic images of the head were obtained using MRA technique without contrast.  COMPARISON CT HEAD W/O CM dated 01/17/2014; MR HEAD WO/W CM dated 04/15/2005  FINDINGS MRI HEAD FINDINGS  The diffusion-weighted images demonstrate no evidence for acute or subacute infarction. Mild generalized atrophy is present. There is slight progression of periventricular white matter disease bilaterally. The ventricles are proportionate to the degree of atrophy. No significant extra-axial fluid collections are present.  Flow is present in the major intracranial arteries. The patient is status  post bilateral lens extractions. The paranasal sinuses and mastoid air cells are clear. A heterogeneous nodule is present in the nasopharynx measuring 9 x 12 x 14 mm and increased in size since the prior study.  MRA HEAD FINDINGS  The internal carotid arteries demonstrate minimal irregularity through the cavernous segments bilaterally. The A1 and M1 segments are normal. The MCA bifurcations are within normal limits. There is some attenuation of distal MCA branch vessels bilaterally, left greater than right. No definite anterior communicating artery is evident.  The vertebral arteries are codominant. The PICA origins are visualized and normal. The basilar artery is within normal limits. Both posterior cerebral arteries originate from the basilar tip. The PCA branch vessels are intact.  IMPRESSION 1. Slight progression of periventricular and subcortical white matter change. This likely reflects the sequelae of chronic microvascular ischemia. 2. No acute intracranial abnormality. 3. Mild to moderate distal small vessel disease is most evident within the left MCA territory. 4. No significant proximal stenosis, aneurysm, or branch vessel occlusion.  SIGNATURE  Electronically Signed   By: Gennette Pac [name]   On: 01/17/2014 15:57    Scheduled Meds: . calcium carbonate  200 mg of elemental calcium Oral BID  . colchicine  0.6 mg Oral Daily  . enoxaparin (LOVENOX) injection  40 mg Subcutaneous Q24H  . hydrALAZINE  25 mg Oral 3 times per day  . insulin aspart  0-15 Units Subcutaneous 6 times per day  . [START ON 01/19/2014] metoprolol succinate  100 mg Oral Q breakfast   Continuous Infusions:   Principal Problem:   Syncope and collapse Active Problems:   DIABETES MELLITUS, TYPE II   HYPERLIPIDEMIA   HYPERTENSION   Reactive airway disease   Fibromyalgia   HTN (hypertension)   Stroke   Seizures    Time spent: 18 MINS    [facility] MD [facility] Pager [phone]. If 7PM-7AM, please  contact night-coverage at [email or web address], password [facility] 01/18/2014, 11:50 AM  LOS: 1 day

## 2014-01-18 NOTE — Progress Notes (Signed)
  Echocardiogram 2D Echocardiogram has been performed.  [name] [name] 01/18/2014, 10:58 AM

## 2014-01-18 NOTE — Progress Notes (Signed)
VASCULAR LAB PRELIMINARY  PRELIMINARY  PRELIMINARY  PRELIMINARY  Carotid Dopplers completed.    Preliminary report:  1-39% ICA stenosis.  Vertebral artery flow is antegrade.  Jasun Gasparini, RVT 01/18/2014, 11:08 AM

## 2014-01-18 NOTE — Procedures (Signed)
ELECTROENCEPHALOGRAM REPORT   Patient: [name] [name]       Room #: 4T65 EEG No. ID: [id number] Age: 80 y.o.        Sex: female Referring Physician: Janee Morn Report Date:  01/18/2014        Interpreting Physician: Thana Farr D  History: [name] [name] is an 80 y.o. female presenting after an episode of loss of consciousness  Medications:  Scheduled: . calcium carbonate  200 mg of elemental calcium Oral BID  . colchicine  0.6 mg Oral Daily  . enoxaparin (LOVENOX) injection  40 mg Subcutaneous Q24H  . hydrALAZINE  25 mg Oral 3 times per day  . insulin aspart  0-15 Units Subcutaneous 6 times per day  . [START ON 01/19/2014] metoprolol succinate  100 mg Oral Q breakfast    Conditions of Recording:  This is a 16 channel EEG carried out with the patient in the awake and drowsy states.  Description:  The waking background activity consists of a low voltage, symmetrical, fairly well organized, 9 Hz alpha activity, seen from the parieto-occipital and posterior temporal regions.  Low voltage fast activity, poorly organized, is seen anteriorly and is at times superimposed on more posterior regions.  A mixture of theta and alpha rhythms are seen from the central and temporal regions. The patient drowses with slowing to irregular, low voltage theta and beta activity.   Stage II sleep is not obtained. Hyperventilation produced a mild to moderate buildup but failed to elicit any abnormalities.  Intermittent photic stimulation was performed but failed to illicit any change in the tracing.   IMPRESSION: Normal electroencephalogram, awake, drowsy and with activation procedures. There are no focal lateralizing or epileptiform features.   Thana Farr, MD [facility] [phone] 01/18/2014, 1:45 PM

## 2014-01-18 NOTE — Progress Notes (Signed)
Nutrition Brief Note  Malnutrition Screening Tool result is inaccurate. Pt denied wt loss and denied eating poorly. Please consult if nutrition needs are identified.  Ian Malkin RD, LDN Pager: [phone]

## 2014-01-19 LAB — GLUCOSE, CAPILLARY
Glucose-Capillary: 130 mg/dL — ABNORMAL HIGH (ref 70–99)
Glucose-Capillary: 110 mg/dL — ABNORMAL HIGH (ref 70–99)
Glucose-Capillary: 134 mg/dL — ABNORMAL HIGH (ref 70–99)

## 2014-01-19 MED ORDER — LABETALOL HCL 200 MG PO TABS
200.0000 mg | ORAL_TABLET | Freq: Two times a day (BID) | ORAL | Status: DC
  Administered 2014-01-19 – 2014-01-21 (×5): 200 mg via ORAL
  Filled 2014-01-19 (×6): qty 1

## 2014-01-19 MED ORDER — HYDRALAZINE HCL 50 MG PO TABS
50.0000 mg | ORAL_TABLET | Freq: Once | ORAL | Status: AC
  Administered 2014-01-19: 50 mg via ORAL
  Filled 2014-01-19: qty 1

## 2014-01-19 MED ORDER — HYDRALAZINE HCL 50 MG PO TABS
50.0000 mg | ORAL_TABLET | Freq: Three times a day (TID) | ORAL | Status: DC
  Administered 2014-01-19 – 2014-01-20 (×5): 50 mg via ORAL
  Filled 2014-01-19 (×9): qty 1

## 2014-01-19 MED ORDER — HYDRALAZINE HCL 50 MG PO TABS
100.0000 mg | ORAL_TABLET | Freq: Three times a day (TID) | ORAL | Status: DC
  Filled 2014-01-19 (×3): qty 2

## 2014-01-19 MED ORDER — HYDRALAZINE HCL 20 MG/ML IJ SOLN
10.0000 mg | Freq: Four times a day (QID) | INTRAMUSCULAR | Status: DC | PRN
Start: 2014-01-19 — End: 2014-01-22

## 2014-01-19 MED ORDER — INSULIN ASPART 100 UNIT/ML ~~LOC~~ SOLN
0.0000 [IU] | Freq: Three times a day (TID) | SUBCUTANEOUS | Status: DC
  Administered 2014-01-20: 2 [IU] via SUBCUTANEOUS
  Administered 2014-01-20 – 2014-01-21 (×2): 3 [IU] via SUBCUTANEOUS
  Administered 2014-01-21 – 2014-01-22 (×3): 2 [IU] via SUBCUTANEOUS

## 2014-01-19 MED ORDER — MECLIZINE HCL 12.5 MG PO TABS
12.5000 mg | ORAL_TABLET | Freq: Three times a day (TID) | ORAL | Status: DC | PRN
  Filled 2014-01-19: qty 1

## 2014-01-19 NOTE — Evaluation (Signed)
Occupational Therapy Evaluation Patient Details Name: [name] [name] MRN: [id number] DOB: [birth year] Today's Date: 01/19/2014 Time: 0626-9485 OT Time Calculation (min): 28 min  OT Assessment / Plan / Recommendation History of present illness HPI: [name] [name] 80 yo BF PMHx  fibromyalgia, Hx MI, Hx TIA, RAD, HTN, HLD, Diabetes type 2 presenting after an episode of loss of consciousness. I spoke with her caregiver via telephone, he states that the patient was having a normal conversation when she suddenly stiffened. She then slumped over into her chair and her eyes rolled back in her head. She then began having some shaking of her legs. She was reportedly confused for a short period of time after the episode. Review of patient's record shows that patient was seen 12/23/2013 for similar event (syncope vs seizure), however was not admitted. CT of head without contrast 3/11 showed chronic microvascular ischemia, no acute abnormality.   Clinical Impression   Patient evaluated by Occupational Therapy with no further acute OT needs identified. All education has been completed and the patient has no further questions. Pt appears close to her baseline level of functioning with BADLs.  She has an aide 7 days/wk who assists her with BADLs, and family who assists intermittently through the day.  See below for any follow-up Occupational Therapy or equipment needs. OT is signing off. Thank you for this referral.     OT Assessment  Patient does not need any further OT services    Follow Up Recommendations  No OT follow up;Supervision/Assistance - 24 hour    Barriers to Discharge      Equipment Recommendations  None recommended by OT    Recommendations for Other Services    Frequency       Precautions / Restrictions Precautions Precautions: Fall Restrictions Weight Bearing Restrictions: No   Pertinent Vitals/Pain     ADL  Eating/Feeding: Modified independent Where Assessed - Eating/Feeding:  Chair Grooming: Wash/dry hands;Wash/dry face;Teeth care;Supervision/safety Where Assessed - Grooming: Unsupported standing;Supported standing Upper Body Bathing: Moderate assistance Where Assessed - Upper Body Bathing: Supported sitting;Unsupported sitting Lower Body Bathing: Maximal assistance Where Assessed - Lower Body Bathing: Supported sit to stand Upper Body Dressing: Moderate assistance Where Assessed - Upper Body Dressing: Unsupported sitting Lower Body Dressing: Maximal assistance Where Assessed - Lower Body Dressing: Supported sit to stand Toilet Transfer: Supervision/safety Statistician Method: Sit to stand;Stand pivot Acupuncturist: Comfort height toilet Toileting - Architect and Hygiene: Supervision/safety Where Assessed - Engineer, mining and Hygiene: Sit to stand from 3-in-1 or toilet Equipment Used: Rolling walker Transfers/Ambulation Related to ADLs: supervision with RW ADL Comments: Assisted pt with LB dressing.  and bathing, and toileting.  She appears to be close to her baseline    OT Diagnosis:    OT Problem List:   OT Treatment Interventions:     OT Goals(Current goals can be found in the care plan section) Acute Rehab OT Goals Patient Stated Goal: to go home  Visit Information  Last OT Received On: 01/19/14 Assistance Needed: +1 History of Present Illness: HPI: [name] [name] 80 yo BF PMHx  fibromyalgia, Hx MI, Hx TIA, RAD, HTN, HLD, Diabetes type 2 presenting after an episode of loss of consciousness. I spoke with her caregiver via telephone, he states that the patient was having a normal conversation when she suddenly stiffened. She then slumped over into her chair and her eyes rolled back in her head. She then began having some shaking of her legs. She was  reportedly confused for a short period of time after the episode. Review of patient's record shows that patient was seen 12/23/2013 for similar event (syncope vs  seizure), however was not admitted. CT of head without contrast 3/11 showed chronic microvascular ischemia, no acute abnormality.       Prior Functioning     Home Living Family/patient expects to be discharged to:: Private residence Living Arrangements: Alone Available Help at Discharge: Family;Available 24 hours/day Type of Home: Independent living facility Home Access: Level entry Home Layout: One level Home Equipment: Hand held shower head;Walker - 4 wheels;Walker - 2 wheels;Cane - single point;Shower seat Additional Comments: has aid 7 days/week who assists with BADLs Prior Function Level of Independence: Independent;Needs assistance Gait / Transfers Assistance Needed: pt reports she was modified independent ADL's / Homemaking Assistance Needed: Pt reports her aid assists her with bathing and dressing.  She stands to wash peri anal area, performs toileting modified independently, but struggles at times pulling adult diapers over hips; was able to complete simple meal prep (reheating meals0 Communication Communication: No difficulties Dominant Hand: Right         Vision/Perception Vision - History Patient Visual Report: No change from baseline Vision - Assessment Vision Assessment: Vision not tested   Cognition  Cognition Arousal/Alertness: Awake/alert Behavior During Therapy: WFL for tasks assessed/performed Overall Cognitive Status: Within Functional Limits for tasks assessed    Extremity/Trunk Assessment Upper Extremity Assessment Upper Extremity Assessment: RUE deficits/detail RUE Deficits / Details: long standing limitation Rt. shoulder due to arthritis Lower Extremity Assessment Lower Extremity Assessment: Defer to PT evaluation     Mobility Bed Mobility Overal bed mobility: Needs Assistance Bed Mobility: Supine to Sit Supine to sit: Supervision General bed mobility comments: No physical assist required to come to EOB Transfers Overall transfer level:  Needs assistance Equipment used: Rolling walker (2 wheeled) Transfers: Sit to/from [employer] Sit to Stand: Supervision Stand pivot transfers: Supervision General transfer comment: VCs for proper hand placement (pt attempting to use lower rails of RW to stand)     Exercise     Balance General Comments General comments (skin integrity, edema, etc.): orthostatics assessed at start of session, Bps 114/64 supine, 114/57 seated EOB, 111/63 standing, 115/67 post ambulation (seated in chair).  Pt reports feeling weak and nauseated/dizzy all day   End of Session OT - End of Session Equipment Utilized During Treatment: Rolling walker Activity Tolerance: Patient tolerated treatment well Patient left: in chair;with call bell/phone within reach;with nursing/sitter in room Nurse Communication: Mobility status  GO     Jeani Hawking M 01/19/2014, 6:47 PM

## 2014-01-19 NOTE — Progress Notes (Signed)
Pt's BP read 221/112 at 0625,pt denies any discomfort, Dr Verta Ellen (on call) paged and notified,called back and said pt will be seen and reviewed by the day shift doctors, pt however had tab apresoline 50mg  at 0630 as ordered,will continue to monitor. , Amarissa Koerner Efe

## 2014-01-19 NOTE — Progress Notes (Signed)
[facility] PROGRESS NOTE  LEYLANI DULEY NLG:[id number] DOB: 1930/04/19 DOA: 01/17/2014 PCP: Oneal Grout, MD  Assessment/Plan: #1 syncope Questionable etiology. Orthostatics were not checked on admission and patient was on a multitude of antihypertensive medications which were held on admission. CT of the head is negative. MRI of the head is negative. Chest x-ray is negative for any acute infection. EEG is negative. Preliminary carotid Dopplers with no significant stenosis. 2-D echo with no valvular abnormalities. Continue gentle hydration and supportive care. Patient complaining of a spinning sensation and dizziness. Concern for possible vertigo. PT/OT full vestibular evaluation. Will place on meclizine as needed. Follow.  #2 ??? Seizures MRI of the head negative. CT head negative. No significant electrolyte abnormalities. EEG has been done and is pending. Follow.  #3 history of CVA Patient with chronic right lower extremity weakness at baseline. Continue aspirin for secondary stroke prevention. PT/OT.  #4 hypertension Change Toprol to labetalol. Continue hydralazine 50 mg 3 times daily. Follow and titrate as needed.  #5 diabetes mellitus type 2 Hemoglobin A1c 7.1. CBGs have ranged from 110-134. Continue sliding scale insulin.  #6 hyperlipidemia Fasting lipid panel with total cholesterol 171 and LDL of 77 and HDL of 71.  #7 prophylaxis Lovenox for DVT prophylaxis.  Code Status: Full Family Communication: Updated patient at bedside. Disposition Plan: Home when medically stable.   Consultants:  None  Procedures:  2-D echo 01/18/2014  MRI/MRA brain 01/17/2014  Chest x-ray 01/17/2014  CT head 01/17/2014  EEG 01/18/2014  Antibiotics:  None  HPI/Subjective: Patient states she's feeling better than she did when she came in. Patient complaining of dizziness and spinning sensation.  Objective: Filed Vitals:   01/19/14 0821  BP: 137/65  Pulse: 87  Temp:  97.7 F (36.5 C)  Resp: 20    Intake/Output Summary (Last 24 hours) at 01/19/14 0847 Last data filed at 01/19/14 0814  Gross per 24 hour  Intake    680 ml  Output      0 ml  Net    680 ml   Filed Weights   01/17/14 1700  Weight: 89.359 kg (197 lb)    Exam:   General:  NAD  Cardiovascular: RRR  Respiratory: CTAB  Abdomen: Soft/NT/ND/+BS  Musculoskeletal: No c/c/e  Data Reviewed: Basic Metabolic Panel:  Recent Labs Lab 01/12/14 1148 01/17/14 1020 01/17/14 1307  NA 137 137  --   K 3.8 3.6*  --   CL 97 98  --   CO2 21 24  --   GLUCOSE 124* 151*  --   BUN 16 20  --   CREATININE 0.78 0.66 0.64  CALCIUM 10.4* 9.7  --    Liver Function Tests:  Recent Labs Lab 01/12/14 1148  AST 16  ALT 12  ALKPHOS 85  BILITOT 0.4  PROT 7.4   No results found for this basename: LIPASE, AMYLASE,  in the last 168 hours No results found for this basename: AMMONIA,  in the last 168 hours CBC:  Recent Labs Lab 01/12/14 1148 01/17/14 1020 01/17/14 1307  WBC 10.9* 11.1* 10.3  NEUTROABS 6.8 7.4  --   HGB 11.3 10.6* 10.6*  HCT 33.3* 31.5* 31.4*  MCV 83 86.1 85.8  PLT  --  329 321   Cardiac Enzymes:  Recent Labs Lab 01/17/14 1020 01/17/14 1307 01/17/14 2034 01/18/14 0043  TROPONINI <0.30 <0.30 <0.30 <0.30   BNP (last 3 results)  Recent Labs  01/17/14 1307  PROBNP 169.1   CBG:  Recent Labs  Lab 01/18/14 0518 01/18/14 1207 01/18/14 1702 01/18/14 2302 01/19/14 0649  GLUCAP 113* 137* 103* 111* 130*    No results found for this or any previous visit (from the past 240 hour(s)).   Studies: Dg Chest 2 View  01/17/2014   CLINICAL DATA Stroke-like stent  EXAM CHEST  2 VIEW  COMPARISON 01/17/1949 1018 hrs  FINDINGS Cardiac shadow is stable. The lungs are well aerated bilaterally. No focal infiltrate or sizable effusion is seen. No acute bony abnormality is noted.  IMPRESSION No acute abnormality seen.  SIGNATURE  Electronically Signed   By: Alcide Clever  [name]   On: 01/17/2014 14:31   Ct Head Wo Contrast  01/17/2014   CLINICAL DATA Syncope  EXAM CT HEAD WITHOUT CONTRAST  TECHNIQUE Contiguous axial images were obtained from the base of the skull through the vertex without intravenous contrast.  COMPARISON CT 11/14/2006  FINDINGS Mild chronic microvascular ischemic change has progressed in the interval. Mild atrophy is similar. Ventricle size is normal.  Negative for acute infarct.  Negative for hemorrhage or mass.  Chronic sinusitis.  IMPRESSION Chronic microvascular ischemia.  No acute abnormality.  Chronic sinusitis.  SIGNATURE  Electronically Signed   By: Marlan Palau [name]   On: 01/17/2014 10:53   Mr Brain Wo Contrast  01/17/2014   CLINICAL DATA Syncopal episode.  Weakness.  EXAM MRI HEAD WITHOUT CONTRAST  MRA HEAD WITHOUT CONTRAST  TECHNIQUE Multiplanar, multiecho pulse sequences of the brain and surrounding structures were obtained without intravenous contrast. Angiographic images of the head were obtained using MRA technique without contrast.  COMPARISON CT HEAD W/O CM dated 01/17/2014; MR HEAD WO/W CM dated 04/15/2005  FINDINGS MRI HEAD FINDINGS  The diffusion-weighted images demonstrate no evidence for acute or subacute infarction. Mild generalized atrophy is present. There is slight progression of periventricular white matter disease bilaterally. The ventricles are proportionate to the degree of atrophy. No significant extra-axial fluid collections are present.  Flow is present in the major intracranial arteries. The patient is status post bilateral lens extractions. The paranasal sinuses and mastoid air cells are clear. A heterogeneous nodule is present in the nasopharynx measuring 9 x 12 x 14 mm and increased in size since the prior study.  MRA HEAD FINDINGS  The internal carotid arteries demonstrate minimal irregularity through the cavernous segments bilaterally. The A1 and M1 segments are normal. The MCA bifurcations are within normal limits. There  is some attenuation of distal MCA branch vessels bilaterally, left greater than right. No definite anterior communicating artery is evident.  The vertebral arteries are codominant. The PICA origins are visualized and normal. The basilar artery is within normal limits. Both posterior cerebral arteries originate from the basilar tip. The PCA branch vessels are intact.  IMPRESSION 1. Slight progression of periventricular and subcortical white matter change. This likely reflects the sequelae of chronic microvascular ischemia. 2. No acute intracranial abnormality. 3. Mild to moderate distal small vessel disease is most evident within the left MCA territory. 4. No significant proximal stenosis, aneurysm, or branch vessel occlusion.  SIGNATURE  Electronically Signed   By: Gennette Pac [name]   On: 01/17/2014 15:57   Dg Chest Port 1 View  01/17/2014   CLINICAL DATA Recent seizure activity  EXAM PORTABLE CHEST - 1 VIEW  COMPARISON 12/23/2013  FINDINGS Cardiac shadow is enlarged but stable. The lungs are well aerated bilaterally. Degenerative changes of the shoulder joints are seen. No acute bony abnormality is noted.  IMPRESSION No acute abnormality noted.  SIGNATURE  Electronically Signed   By: Alcide Clever [name]   On: 01/17/2014 10:38   Mr Maxine Glenn Head/brain Wo Cm  01/17/2014   CLINICAL DATA Syncopal episode.  Weakness.  EXAM MRI HEAD WITHOUT CONTRAST  MRA HEAD WITHOUT CONTRAST  TECHNIQUE Multiplanar, multiecho pulse sequences of the brain and surrounding structures were obtained without intravenous contrast. Angiographic images of the head were obtained using MRA technique without contrast.  COMPARISON CT HEAD W/O CM dated 01/17/2014; MR HEAD WO/W CM dated 04/15/2005  FINDINGS MRI HEAD FINDINGS  The diffusion-weighted images demonstrate no evidence for acute or subacute infarction. Mild generalized atrophy is present. There is slight progression of periventricular white matter disease bilaterally. The ventricles are  proportionate to the degree of atrophy. No significant extra-axial fluid collections are present.  Flow is present in the major intracranial arteries. The patient is status post bilateral lens extractions. The paranasal sinuses and mastoid air cells are clear. A heterogeneous nodule is present in the nasopharynx measuring 9 x 12 x 14 mm and increased in size since the prior study.  MRA HEAD FINDINGS  The internal carotid arteries demonstrate minimal irregularity through the cavernous segments bilaterally. The A1 and M1 segments are normal. The MCA bifurcations are within normal limits. There is some attenuation of distal MCA branch vessels bilaterally, left greater than right. No definite anterior communicating artery is evident.  The vertebral arteries are codominant. The PICA origins are visualized and normal. The basilar artery is within normal limits. Both posterior cerebral arteries originate from the basilar tip. The PCA branch vessels are intact.  IMPRESSION 1. Slight progression of periventricular and subcortical white matter change. This likely reflects the sequelae of chronic microvascular ischemia. 2. No acute intracranial abnormality. 3. Mild to moderate distal small vessel disease is most evident within the left MCA territory. 4. No significant proximal stenosis, aneurysm, or branch vessel occlusion.  SIGNATURE  Electronically Signed   By: Gennette Pac [name]   On: 01/17/2014 15:57    Scheduled Meds: . aspirin EC  81 mg Oral Daily  . calcium carbonate  200 mg of elemental calcium Oral BID  . colchicine  0.6 mg Oral Daily  . enoxaparin (LOVENOX) injection  40 mg Subcutaneous Q24H  . hydrALAZINE  50 mg Oral 3 times per day  . insulin aspart  0-15 Units Subcutaneous 6 times per day  . labetalol  200 mg Oral BID   Continuous Infusions:   Principal Problem:   Syncope and collapse Active Problems:   DIABETES MELLITUS, TYPE II   HYPERLIPIDEMIA   HYPERTENSION   Reactive airway disease    Fibromyalgia   HTN (hypertension)   Stroke   Seizures    Time spent: 32 MINS    [facility] MD [facility] Pager [phone]. If 7PM-7AM, please contact night-coverage at [email or web address], password [facility] 01/19/2014, 8:47 AM  LOS: 2 days

## 2014-01-19 NOTE — Progress Notes (Signed)
   CARE MANAGEMENT NOTE 01/19/2014  Patient:  [name] [name], [name] [name]   Account Number:  [id number]  Date Initiated:  01/19/2014  Documentation initiated by:  Jiles Crocker  Subjective/Objective Assessment:   ADMITTED WITH SYNCOPAL EPISODE VS SEIZURE     Action/Plan:   LIVES AT HOME WITH CAREGIVERS; CM FOLLOWING FOR DCP   Anticipated DC Date:  01/20/2014   Anticipated DC Plan:  HOME/SELF CARE      DC Planning Services  CM consult         Status of service:  In process, will continue to follow Medicare Important Message given?   (If response is "NO", the following Medicare IM given date fields will be blank)  Per UR Regulation:  Reviewed for med. necessity/level of care/duration of stay  Comments:  3/13/2015Abelino Derrick RN,BSN,MHA [phone]

## 2014-01-19 NOTE — Progress Notes (Signed)
Physical Therapy Treatment Patient Details Name: [name] [name] MRN: [id number] DOB: 01-Jul-1930 Today's Date: 01/19/2014 Time: 0932-6712 PT Time Calculation (min): 24 min  PT Assessment / Plan / Recommendation  History of Present Illness HPI: [name] [name] 80 yo BF PMHx  fibromyalgia, Hx MI, Hx TIA, RAD, HTN, HLD, Diabetes type 2 presenting after an episode of loss of consciousness. I spoke with her caregiver via telephone, he states that the patient was having a normal conversation when she suddenly stiffened. She then slumped over into her chair and her eyes rolled back in her head. She then began having some shaking of her legs. She was reportedly confused for a short period of time after the episode. Review of patient's record shows that patient was seen 12/23/2013 for similar event (syncope vs seizure), however was not admitted. CT of head without contrast 3/11 showed chronic microvascular ischemia, no acute abnormality.   PT Comments   Patient able to tolerate ambulation today (24 ft) despite increased fatigue, weakness and pt reporting nauseated feeling earlier today.  Vitals assessed, orthostatics taken with 3 mins allowed between readings.  Follow Up Recommendations  No PT follow up;Supervision - Intermittent           Equipment Recommendations  None recommended by PT       Frequency Min 4X/week   Progress towards PT Goals Progress towards PT goals: Progressing toward goals  Plan Current plan remains appropriate    Precautions / Restrictions Precautions Precautions: Fall Restrictions Weight Bearing Restrictions: No   Pertinent Vitals/Pain See general comments    Mobility  Bed Mobility Overal bed mobility: Needs Assistance Bed Mobility: Supine to Sit Supine to sit: Supervision General bed mobility comments: No physical assist required to come to EOB Transfers Overall transfer level: Needs assistance Equipment used: Rolling walker (2 wheeled) Transfers: Sit  to/from Stand Sit to Stand: Min guard Stand pivot transfers: Min guard General transfer comment: VCs for proper hand placement (pt attempting to use lower rails of RW to stand) Ambulation/Gait Ambulation/Gait assistance: Min guard Ambulation Distance (Feet): 24 Feet Assistive device: Rolling walker (2 wheeled) Gait Pattern/deviations: Decreased stride length;Trunk flexed Gait velocity: decreased Gait velocity interpretation: Below normal speed for age/gender    Exercises     PT Diagnosis: Difficulty walking;Abnormality of gait;Generalized weakness  PT Problem List: Decreased strength;Decreased range of motion;Decreased activity tolerance;Decreased balance;Decreased mobility;Obesity PT Treatment Interventions: DME instruction;Gait training;Functional mobility training;Therapeutic activities;Therapeutic exercise;Balance training;Patient/family education   PT Goals (current goals can now be found in the care plan section) Acute Rehab PT Goals Patient Stated Goal: to go home PT Goal Formulation: With patient/family Time For Goal Achievement: 02/01/14 Potential to Achieve Goals: Good  Visit Information  Last PT Received On: 01/19/14 Assistance Needed: +1 History of Present Illness: HPI: [name] [name] 80 yo BF PMHx  fibromyalgia, Hx MI, Hx TIA, RAD, HTN, HLD, Diabetes type 2 presenting after an episode of loss of consciousness. I spoke with her caregiver via telephone, he states that the patient was having a normal conversation when she suddenly stiffened. She then slumped over into her chair and her eyes rolled back in her head. She then began having some shaking of her legs. She was reportedly confused for a short period of time after the episode. Review of patient's record shows that patient was seen 12/23/2013 for similar event (syncope vs seizure), however was not admitted. CT of head without contrast 3/11 showed chronic microvascular ischemia, no acute abnormality.    Subjective  Data  Patient Stated Goal: to go home   Cognition  Cognition Arousal/Alertness: Awake/alert Behavior During Therapy: WFL for tasks assessed/performed Overall Cognitive Status: Within Functional Limits for tasks assessed    Balance  General Comments General comments (skin integrity, edema, etc.): orthostatics assessed at start of session, Bps 114/64 supine, 114/57 seated EOB, 111/63 standing, 115/67 post ambulation (seated in chair).  Pt reports feeling weak and nauseated/dizzy all day  End of Session PT - End of Session Equipment Utilized During Treatment: Gait belt Activity Tolerance: Treatment limited secondary to medical complications (Comment) (elevated BP 188/113) Patient left: in chair;with call bell/phone within reach;with family/visitor present (family advised patient must have staff with her for [facility]) Nurse Communication: Mobility status   GP     Fabio Asa 01/19/2014, 3:21 PM Charlotte Crumb, PT DPT  [phone]

## 2014-01-19 NOTE — Evaluation (Signed)
Speech Language Pathology Evaluation Patient Details Name: [name] [name] MRN: [id number] DOB: Aug 11, 1930 Today's Date: 01/19/2014 Time: 5400-8676 SLP Time Calculation (min): 15 min  Problem List:  Patient Active Problem List   Diagnosis Date Noted  . Stroke 01/17/2014  . Syncope and collapse 01/17/2014  . Seizures 01/17/2014  . Rheumatoid arthritis 12/19/2013  . CAD (coronary artery disease) 10/18/2013  . Unspecified arthropathy, lower leg 09/13/2013  . Other specified disease of white blood cells 09/13/2013  . Cold intolerance 09/12/2013  . Anemia 09/12/2013  . Need for prophylactic vaccination and inoculation against influenza 09/12/2013  . Acute blood loss anemia 08/15/2013  . Acute post-hemorrhagic anemia 08/09/2013  . HTN (hypertension) 08/08/2013  . Hyponatremia 08/08/2013  . Preoperative clearance 07/11/2013  . Myalgia and myositis 05/30/2013  . Fibromyalgia 05/10/2013  . Acute gout 05/03/2013  . Acute bronchitis 05/03/2013  . Edema 05/03/2013  . Reactive airway disease 04/19/2013  . Gout 04/19/2013  . DIABETES MELLITUS, TYPE II 04/27/2007  . HYPERLIPIDEMIA 04/27/2007  . HYPERTENSION 04/27/2007  . ALLERGIC RHINITIS 04/27/2007  . DIVERTICULOSIS, COLON 04/27/2007  . OSTEOARTHRITIS 04/27/2007   Past Medical History:  Past Medical History  Diagnosis Date  . Hypertension   . First degree atrioventricular block   . Onychia and paronychia of toe   . Muscle weakness (generalized)   . Allergic rhinitis due to pollen   . Gout, unspecified   . Disorder of bone and cartilage, unspecified   . Edema   . Hyperlipidemia   . Unspecified essential hypertension   . Coronary atherosclerosis of native coronary artery   . Other malaise and fatigue   . Cough   . Other fall   . Unspecified vitamin D deficiency   . TIA (transient ischemic attack)     "a few one summer" (08/07/2013)  . Myocardial infarction ~ 1970  . Exertional shortness of breath     "sometimes"  (08/07/2013)  . Type II diabetes mellitus     "fasting 90-110s" (08/07/2013)  . Osteoarthrosis, unspecified whether generalized or localized, unspecified site   . Prepatellar bursitis   . Arthritis     "in q joint" (08/07/2013)   Past Surgical History:  Past Surgical History  Procedure Laterality Date  . Tonsillectomy  08/1974  . Replacement total knee Right 09/2005  . Shoulder open rotator cuff repair Right 07/1999  . Appendectomy  1960  . Abdominal hysterectomy  04/1980  . Transthoracic echocardiogram  2003    EF 55-65%; mild concentric LVH  . Joint replacement    . Total knee arthroplasty Left 08/07/2013  . Cataract extraction w/ intraocular lens  implant, bilateral Bilateral ~ 2012  . Total knee arthroplasty Left 08/07/2013    Procedure: TOTAL KNEE ARTHROPLASTY- LEFT;  Surgeon: Dannielle Huh, MD;  Location: [facility] OR;  Service: Orthopedics;  Laterality: Left;  . Cardiac catheterization  2003   HPI:  80 yo BF PMHx fibromyalgia, Hx MI, Hx TIA, RAD, HTN, HLD, Diabetes type 2 presenting after an episode of loss of consciousness. I spoke with her caregiver via telephone, he states that the patient was having a normal conversation when she suddenly stiffened. She then slumped over into her chair and her eyes rolled back in her head. She then began having some shaking of her legs. She was reportedly confused for a short period of time after the episode.  CT of head without contrast 3/11 showed chronic microvascular ischemia, no acute abnormality.  MRI No acute intracranial abnormality   Assessment /  Plan / Recommendation Clinical Impression  Pt. and daughter report no difficulty wit communication or cognition prior to admission or at present.  She demonstrated executive functioning abilities during discussion regarding hospitalization and home information.  No ST is warranted at this time.    SLP Assessment  Patient does not need any further Speech Lanaguage Pathology Services    Follow Up  Recommendations  None    Frequency and Duration        Pertinent Vitals/Pain WDL       SLP Evaluation Prior Functioning  Cognitive/Linguistic Baseline: Within functional limits (per daughter and pt.) Type of Home: Independent living facility Available Help at Discharge: Family;Available 24 hours/day Vocation: Retired   IT consultant  Overall Cognitive Status: Within Functional Limits for tasks assessed Orientation Level: Oriented X4    Comprehension  Auditory Comprehension Overall Auditory Comprehension: Appears within functional limits for tasks assessed Visual Recognition/Discrimination Discrimination: Not tested Reading Comprehension Reading Status: Not tested    Expression Expression Primary Mode of Expression: Verbal Verbal Expression Overall Verbal Expression: Appears within functional limits for tasks assessed Written Expression Dominant Hand: Right Written Expression: Not tested   Oral / Motor Oral Motor/Sensory Function Overall Oral Motor/Sensory Function: Appears within functional limits for tasks assessed Motor Speech Overall Motor Speech: Appears within functional limits for tasks assessed   GO     Royce Macadamia M.Ed [employer] [phone]  01/19/2014

## 2014-01-20 LAB — GLUCOSE, CAPILLARY
Glucose-Capillary: 123 mg/dL — ABNORMAL HIGH (ref 70–99)
Glucose-Capillary: 137 mg/dL — ABNORMAL HIGH (ref 70–99)
Glucose-Capillary: 173 mg/dL — ABNORMAL HIGH (ref 70–99)
Glucose-Capillary: 115 mg/dL — ABNORMAL HIGH (ref 70–99)
Glucose-Capillary: 115 mg/dL — ABNORMAL HIGH (ref 70–99)

## 2014-01-20 LAB — BASIC METABOLIC PANEL
BUN: 20 mg/dL (ref 6–23)
CO2: 23 mEq/L (ref 19–32)
Calcium: 9.8 mg/dL (ref 8.4–10.5)
Chloride: 97 mEq/L (ref 96–112)
Creatinine, Ser: 0.8 mg/dL (ref 0.50–1.10)
GFR calc Af Amer: 78 mL/min — ABNORMAL LOW (ref >90)
GFR calc non Af Amer: 67 mL/min — ABNORMAL LOW (ref >90)
Glucose, Bld: 141 mg/dL — ABNORMAL HIGH (ref 70–99)
Potassium: 3.6 mEq/L — ABNORMAL LOW (ref 3.7–5.3)
Sodium: 137 mEq/L (ref 137–147)

## 2014-01-20 MED ORDER — POTASSIUM CHLORIDE CRYS ER 20 MEQ PO TBCR
40.0000 meq | EXTENDED_RELEASE_TABLET | Freq: Once | ORAL | Status: AC
  Administered 2014-01-20: 40 meq via ORAL
  Filled 2014-01-20: qty 2

## 2014-01-20 NOTE — Progress Notes (Signed)
Physical Therapy Treatment Patient Details Name: [name] [name] MRN: [id number] DOB: 1930-01-17 Today's Date: 01/20/2014 Time: 0160-1093 PT Time Calculation (min): 32 min  PT Assessment / Plan / Recommendation  History of Present Illness HPI: [name] [name] 80 yo BF PMHx  fibromyalgia, Hx MI, Hx TIA, RAD, HTN, HLD, Diabetes type 2 presenting after an episode of loss of consciousness. I spoke with her caregiver via telephone, he states that the patient was having a normal conversation when she suddenly stiffened. She then slumped over into her chair and her eyes rolled back in her head. She then began having some shaking of her legs. She was reportedly confused for a short period of time after the episode. Review of patient's record shows that patient was seen 12/23/2013 for similar event (syncope vs seizure), however was not admitted. CT of head without contrast 3/11 showed chronic microvascular ischemia, no acute abnormality.   PT Comments   Patient making good gains with mobility today.  Patient reports no dizziness/lightheadedness today.  Patient to discharge to daughter's home in [place] for 24 hour assist. Discussed with patient her symptoms of dizziness.  Patient reports she feels lightheaded and faint.  Reports this typically happens when she first gets up in the morning, or when she stands up too quickly.  Reports symptoms clear if she sits back down.  Symptoms not consistent with vestibular issues.   No symptoms today.   Follow Up Recommendations  No PT follow up;Supervision/Assistance - 24 hour (Patient going to daughter's home in [place] at d/c )     Does the patient have the potential to tolerate intense rehabilitation     Barriers to Discharge        Equipment Recommendations  None recommended by PT    Recommendations for Other Services    Frequency Min 4X/week   Progress towards PT Goals Progress towards PT goals: Progressing toward goals  Plan Current plan remains  appropriate    Precautions / Restrictions Precautions Precautions: Fall Restrictions Weight Bearing Restrictions: No   Pertinent Vitals/Pain     Mobility  Transfers Overall transfer level: Needs assistance Equipment used: Rolling walker (2 wheeled) Transfers: Sit to/from Stand Sit to Stand: Supervision General transfer comment: Verbal cues for hand placement.  Assist for safety only.  Instructed patient to stand for several seconds prior to ambulating. Ambulation/Gait Ambulation/Gait assistance: Supervision Ambulation Distance (Feet): 88 Feet Assistive device: Rolling walker (2 wheeled) Gait Pattern/deviations: Step-through pattern;Decreased step length - right;Decreased step length - left;Shuffle;Trunk flexed Gait velocity: decreased Gait velocity interpretation: Below normal speed for age/gender General Gait Details: Verbal cues for safe use of RW.  Cues to stand upright.        PT Goals (current goals can now be found in the care plan section)    Visit Information  Last PT Received On: 01/20/14 Assistance Needed: +1 History of Present Illness: HPI: [name] [name] 80 yo BF PMHx  fibromyalgia, Hx MI, Hx TIA, RAD, HTN, HLD, Diabetes type 2 presenting after an episode of loss of consciousness. I spoke with her caregiver via telephone, he states that the patient was having a normal conversation when she suddenly stiffened. She then slumped over into her chair and her eyes rolled back in her head. She then began having some shaking of her legs. She was reportedly confused for a short period of time after the episode. Review of patient's record shows that patient was seen 12/23/2013 for similar event (syncope vs seizure), however was not admitted.  CT of head without contrast 3/11 showed chronic microvascular ischemia, no acute abnormality.    Subjective Data  Subjective: "I have dizziness when I first get up or stand up to quick."   Cognition  Cognition Arousal/Alertness:  Awake/alert Behavior During Therapy: WFL for tasks assessed/performed Overall Cognitive Status: Within Functional Limits for tasks assessed    Balance  General Comments General comments (skin integrity, edema, etc.): Patient reports no dizziness or lightheadedness today.  End of Session PT - End of Session Equipment Utilized During Treatment: Gait belt Activity Tolerance: Patient tolerated treatment well Patient left: in chair;with call bell/phone within reach Nurse Communication: Mobility status   GP     Vena Austria 01/20/2014, 4:23 PM Durenda Hurt. Renaldo Fiddler, [facility] Acute Rehab Services Pager [phone]

## 2014-01-20 NOTE — Progress Notes (Signed)
[facility] PROGRESS NOTE  [name] [name] XLK:[id number] DOB: [birth year] DOA: 01/17/2014 PCP: Oneal Grout, MD  Assessment/Plan: #1 syncope  Questionable etiology. Orthostatics were not checked on admission and patient was on a multitude of antihypertensive medications which were held on admission. CT of the head is negative. MRI of the head is negative. Chest x-ray is negative for any acute infection. EEG is negative. Preliminary carotid Dopplers with no significant stenosis. 2-D echo with no valvular abnormalities. Continue gentle hydration and supportive care. Patient complaining of a spinning sensation and dizziness which has subsequently improved.. Concern for possible vertigo. Patient has been seen by PT for vascular evaluation and unable to reproduce any of patient's symptoms. Blood pressure is well-controlled currently on current regimen. Meclizine as needed. #2 ??? Seizures  MRI of the head negative. CT head negative. No significant electrolyte abnormalities. EEG is negative.   #3 history of CVA  Patient with chronic right lower extremity weakness at baseline. Continue aspirin for secondary stroke prevention. PT/OT.  #4 hypertension  Blood pressure better controlled on current regimen of labetalol and hydralazine. Outpatient followup. #5 diabetes mellitus type 2  Hemoglobin A1c 7.1. CBGs have ranged from 110-134. Continue sliding scale insulin.  #6 hyperlipidemia  Fasting lipid panel with total cholesterol 171 and LDL of 77 and HDL of 71.  #7 prophylaxis  Lovenox for DVT prophylaxis.        Code Status: Full Family Communication: Updated patient at bedside. Disposition Plan: Home when medically stable hopefully tomorrow.   Consultants:  None  Procedures: 2-D echo 01/18/2014  MRI/MRA brain 01/17/2014  Chest x-ray 01/17/2014  CT head 01/17/2014  EEG 01/18/2014     Antibiotics:  None  HPI/Subjective: Patient states she's feeling better. Patient denies  any dizziness. No complaints.  Objective: Filed Vitals:   01/20/14 1438  BP: 138/71  Pulse: 93  Temp: 98.1 F (36.7 C)  Resp: 20    Intake/Output Summary (Last 24 hours) at 01/20/14 1731 Last data filed at 01/19/14 1741  Gross per 24 hour  Intake    240 ml  Output      0 ml  Net    240 ml   Filed Weights   01/17/14 1700  Weight: 89.359 kg (197 lb)    Exam:   General:  NAD  Cardiovascular: RRR  Respiratory: CTAB  Abdomen: Soft, nontender, nondistended, positive bowel sounds.  Musculoskeletal: No clubbing cyanosis or edema.  Data Reviewed: Basic Metabolic Panel:  Recent Labs Lab 01/17/14 1020 01/17/14 1307 01/20/14 0515  NA 137  --  137  K 3.6*  --  3.6*  CL 98  --  97  CO2 24  --  23  GLUCOSE 151*  --  141*  BUN 20  --  20  CREATININE 0.66 0.64 0.80  CALCIUM 9.7  --  9.8   Liver Function Tests: No results found for this basename: AST, ALT, ALKPHOS, BILITOT, PROT, ALBUMIN,  in the last 168 hours No results found for this basename: LIPASE, AMYLASE,  in the last 168 hours No results found for this basename: AMMONIA,  in the last 168 hours CBC:  Recent Labs Lab 01/17/14 1020 01/17/14 1307  WBC 11.1* 10.3  NEUTROABS 7.4  --   HGB 10.6* 10.6*  HCT 31.5* 31.4*  MCV 86.1 85.8  PLT 329 321   Cardiac Enzymes:  Recent Labs Lab 01/17/14 1020 01/17/14 1307 01/17/14 2034 01/18/14 0043  TROPONINI <0.30 <0.30 <0.30 <0.30   BNP (last 3 results)  Recent Labs  01/17/14 1307  PROBNP 169.1   CBG:  Recent Labs Lab 01/19/14 1636 01/19/14 2100 01/20/14 0632 01/20/14 1116 01/20/14 1638  GLUCAP 134* 123* 137* 173* 115*    No results found for this or any previous visit (from the past 240 hour(s)).   Studies: No results found.  Scheduled Meds: . aspirin EC  81 mg Oral Daily  . calcium carbonate  200 mg of elemental calcium Oral BID  . colchicine  0.6 mg Oral Daily  . enoxaparin (LOVENOX) injection  40 mg Subcutaneous Q24H  .  hydrALAZINE  50 mg Oral 3 times per day  . insulin aspart  0-15 Units Subcutaneous TID WC  . labetalol  200 mg Oral BID   Continuous Infusions:   Principal Problem:   Syncope and collapse Active Problems:   DIABETES MELLITUS, TYPE II   HYPERLIPIDEMIA   HYPERTENSION   Reactive airway disease   Fibromyalgia   HTN (hypertension)   Stroke   Seizures    Time spent: 35 mins    [facility] MD [facility] Pager [phone]. If 7PM-7AM, please contact night-coverage at [email or web address], password [facility] 01/20/2014, 5:31 PM  LOS: 3 days

## 2014-01-21 LAB — BASIC METABOLIC PANEL
BUN: 19 mg/dL (ref 6–23)
CO2: 22 mEq/L (ref 19–32)
Calcium: 9.7 mg/dL (ref 8.4–10.5)
Chloride: 96 mEq/L (ref 96–112)
Creatinine, Ser: 0.75 mg/dL (ref 0.50–1.10)
GFR calc Af Amer: 90 mL/min — ABNORMAL LOW (ref >90)
GFR calc non Af Amer: 77 mL/min — ABNORMAL LOW (ref >90)
Glucose, Bld: 148 mg/dL — ABNORMAL HIGH (ref 70–99)
Potassium: 4 mEq/L (ref 3.7–5.3)
Sodium: 135 mEq/L — ABNORMAL LOW (ref 137–147)

## 2014-01-21 LAB — GLUCOSE, CAPILLARY
Glucose-Capillary: 159 mg/dL — ABNORMAL HIGH (ref 70–99)
Glucose-Capillary: 142 mg/dL — ABNORMAL HIGH (ref 70–99)
Glucose-Capillary: 115 mg/dL — ABNORMAL HIGH (ref 70–99)
Glucose-Capillary: 123 mg/dL — ABNORMAL HIGH (ref 70–99)

## 2014-01-21 MED ORDER — LABETALOL HCL 100 MG PO TABS
100.0000 mg | ORAL_TABLET | Freq: Two times a day (BID) | ORAL | Status: DC
  Administered 2014-01-21 – 2014-01-22 (×2): 100 mg via ORAL
  Filled 2014-01-21 (×3): qty 1

## 2014-01-21 MED ORDER — SODIUM CHLORIDE 0.9 % IV BOLUS (SEPSIS)
250.0000 mL | Freq: Once | INTRAVENOUS | Status: AC
  Administered 2014-01-21: 250 mL via INTRAVENOUS

## 2014-01-21 MED ORDER — PREDNISONE 5 MG PO TABS: 5.0000 mg | ORAL_TABLET | Freq: Every day | ORAL | Status: DC

## 2014-01-21 MED ORDER — HYDRALAZINE HCL 25 MG PO TABS
25.0000 mg | ORAL_TABLET | Freq: Three times a day (TID) | ORAL | Status: DC
  Administered 2014-01-21 – 2014-01-22 (×4): 25 mg via ORAL
  Filled 2014-01-21 (×7): qty 1

## 2014-01-21 MED ORDER — PREDNISONE 5 MG PO TABS
5.0000 mg | ORAL_TABLET | Freq: Every day | ORAL | Status: DC
  Administered 2014-01-21 – 2014-01-22 (×2): 5 mg via ORAL
  Filled 2014-01-21 (×3): qty 1

## 2014-01-21 NOTE — Progress Notes (Signed)
Pt called this nurse to the room. Pt looks pale and complains of lightheadedness. Vital signs checked and recorded. BP 101/60 mmHg, PR 96. MD on-call Burnadette Peter, NP) aware with orders made. Bolus of NS 250 given as ordered. Will continue to monitor.

## 2014-01-21 NOTE — Progress Notes (Signed)
[facility] PROGRESS NOTE  [name] [name] [id number]:[id number] DOB: 09-Dec-1929 DOA: 01/17/2014 PCP: Oneal Grout, MD  Assessment/Plan: #1 syncope Questionable etiology. Orthostatics were not checked on admission and patient was on a multitude of antihypertensive medications which were held on admission. CT of the head is negative. MRI of the head is negative. Chest x-ray is negative for any acute infection. EEG is negative. Preliminary carotid Dopplers with no significant stenosis. 2-D echo with no valvular abnormalities. Continue gentle hydration and supportive care. Patient complaining of a spinning sensation and dizziness. Concern for possible vertigo. PT/OT full vestibular evaluation. Continue meclizine as needed. Decreased blood pressure medications. Follow.  #2 ??? Seizures MRI of the head negative. CT head negative. No significant electrolyte abnormalities. EEG has been done and is pending. Follow.  #3 history of CVA Patient with chronic right lower extremity weakness at baseline. Continue aspirin for secondary stroke prevention. PT/OT.  #4 hypertension Patient with some lightheadedness and systolic blood pressure in the 100s earlier this morning. Will decrease hydralazine to 25 mg 3 times daily,  and decreased labetalol 100 mg twice daily..  Follow and titrate as needed.  #5 diabetes mellitus type 2 Hemoglobin A1c 7.1. CBGs have ranged from 110-134. Continue sliding scale insulin.  #6 hyperlipidemia Fasting lipid panel with total cholesterol 171 and LDL of 77 and HDL of 71.  #7 prophylaxis Lovenox for DVT prophylaxis.  Code Status: Full Family Communication: Updated patient at bedside. Disposition Plan: SNF when medically stable.   Consultants:  None  Procedures:  2-D echo 01/18/2014  MRI/MRA brain 01/17/2014  Chest x-ray 01/17/2014  CT head 01/17/2014  EEG 01/18/2014  Antibiotics:  None  HPI/Subjective: Patient complaining of lightheadedness and not  feeling too well this morning.   Objective: Filed Vitals:   01/21/14 0731  BP: 128/67  Pulse: 96  Temp:   Resp:    No intake or output data in the 24 hours ending 01/21/14 1240 Filed Weights   01/17/14 1700  Weight: 89.359 kg (197 lb)    Exam:   General:  NAD  Cardiovascular: RRR  Respiratory: CTAB  Abdomen: Soft/NT/ND/+BS  Musculoskeletal: No c/c/e  Data Reviewed: Basic Metabolic Panel:  Recent Labs Lab 01/17/14 1020 01/17/14 1307 01/20/14 0515 01/21/14 0452  NA 137  --  137 135*  K 3.6*  --  3.6* 4.0  CL 98  --  97 96  CO2 24  --  23 22  GLUCOSE 151*  --  141* 148*  BUN 20  --  20 19  CREATININE 0.66 0.64 0.80 0.75  CALCIUM 9.7  --  9.8 9.7   Liver Function Tests: No results found for this basename: AST, ALT, ALKPHOS, BILITOT, PROT, ALBUMIN,  in the last 168 hours No results found for this basename: LIPASE, AMYLASE,  in the last 168 hours No results found for this basename: AMMONIA,  in the last 168 hours CBC:  Recent Labs Lab 01/17/14 1020 01/17/14 1307  WBC 11.1* 10.3  NEUTROABS 7.4  --   HGB 10.6* 10.6*  HCT 31.5* 31.4*  MCV 86.1 85.8  PLT 329 321   Cardiac Enzymes:  Recent Labs Lab 01/17/14 1020 01/17/14 1307 01/17/14 2034 01/18/14 0043  TROPONINI <0.30 <0.30 <0.30 <0.30   BNP (last 3 results)  Recent Labs  01/17/14 1307  PROBNP 169.1   CBG:  Recent Labs Lab 01/20/14 1116 01/20/14 1638 01/20/14 2119 01/21/14 0650 01/21/14 1103  GLUCAP 173* 115* 115* 159* 142*    No results found for  this or any previous visit (from the past 240 hour(s)).   Studies: No results found.  Scheduled Meds: . aspirin EC  81 mg Oral Daily  . calcium carbonate  200 mg of elemental calcium Oral BID  . colchicine  0.6 mg Oral Daily  . enoxaparin (LOVENOX) injection  40 mg Subcutaneous Q24H  . hydrALAZINE  25 mg Oral 3 times per day  . insulin aspart  0-15 Units Subcutaneous TID WC  . labetalol  200 mg Oral BID   Continuous Infusions:    Principal Problem:   Syncope and collapse Active Problems:   DIABETES MELLITUS, TYPE II   HYPERLIPIDEMIA   HYPERTENSION   Reactive airway disease   Fibromyalgia   HTN (hypertension)   Stroke   Seizures    Time spent: 50 MINS    [facility] MD [facility] Pager [phone]. If 7PM-7AM, please contact night-coverage at [email or web address], password [facility] 01/21/2014, 12:40 PM  LOS: 4 days

## 2014-01-22 ENCOUNTER — Telehealth: Payer: Self-pay | Admitting: *Deleted

## 2014-01-22 ENCOUNTER — Emergency Department (HOSPITAL_COMMUNITY)
Admission: EM | Admit: 2014-01-22 | Discharge: 2014-01-23 | Disposition: A | Discharge status: Home / Self Care | Payer: Medicare Other | Attending: Emergency Medicine | Admitting: Emergency Medicine

## 2014-01-22 ENCOUNTER — Encounter (HOSPITAL_COMMUNITY): Payer: Self-pay | Admitting: Emergency Medicine

## 2014-01-22 DIAGNOSIS — Z79899 Other long term (current) drug therapy: Secondary | ICD-10-CM | POA: Insufficient documentation

## 2014-01-22 DIAGNOSIS — I2510 Atherosclerotic heart disease of native coronary artery without angina pectoris: Secondary | ICD-10-CM | POA: Insufficient documentation

## 2014-01-22 DIAGNOSIS — E119 Type 2 diabetes mellitus without complications: Secondary | ICD-10-CM | POA: Insufficient documentation

## 2014-01-22 DIAGNOSIS — Z9889 Other specified postprocedural states: Secondary | ICD-10-CM | POA: Insufficient documentation

## 2014-01-22 DIAGNOSIS — Z872 Personal history of diseases of the skin and subcutaneous tissue: Secondary | ICD-10-CM | POA: Insufficient documentation

## 2014-01-22 DIAGNOSIS — I10 Essential (primary) hypertension: Secondary | ICD-10-CM | POA: Insufficient documentation

## 2014-01-22 DIAGNOSIS — M1990 Unspecified osteoarthritis, unspecified site: Secondary | ICD-10-CM | POA: Insufficient documentation

## 2014-01-22 DIAGNOSIS — IMO0002 Reserved for concepts with insufficient information to code with codable children: Secondary | ICD-10-CM | POA: Insufficient documentation

## 2014-01-22 DIAGNOSIS — Z8673 Personal history of transient ischemic attack (TIA), and cerebral infarction without residual deficits: Secondary | ICD-10-CM | POA: Insufficient documentation

## 2014-01-22 DIAGNOSIS — Z7982 Long term (current) use of aspirin: Secondary | ICD-10-CM | POA: Insufficient documentation

## 2014-01-22 DIAGNOSIS — I252 Old myocardial infarction: Secondary | ICD-10-CM | POA: Insufficient documentation

## 2014-01-22 DIAGNOSIS — Z794 Long term (current) use of insulin: Secondary | ICD-10-CM | POA: Insufficient documentation

## 2014-01-22 DIAGNOSIS — M109 Gout, unspecified: Secondary | ICD-10-CM | POA: Insufficient documentation

## 2014-01-22 DIAGNOSIS — Z8709 Personal history of other diseases of the respiratory system: Secondary | ICD-10-CM | POA: Insufficient documentation

## 2014-01-22 LAB — BASIC METABOLIC PANEL
BUN: 19 mg/dL (ref 6–23)
CO2: 24 mEq/L (ref 19–32)
Calcium: 9.7 mg/dL (ref 8.4–10.5)
Chloride: 101 mEq/L (ref 96–112)
Creatinine, Ser: 0.75 mg/dL (ref 0.50–1.10)
GFR calc Af Amer: 90 mL/min — ABNORMAL LOW (ref >90)
GFR calc non Af Amer: 77 mL/min — ABNORMAL LOW (ref >90)
Glucose, Bld: 124 mg/dL — ABNORMAL HIGH (ref 70–99)
Potassium: 4.3 mEq/L (ref 3.7–5.3)
Sodium: 137 mEq/L (ref 137–147)

## 2014-01-22 LAB — GLUCOSE, CAPILLARY
Glucose-Capillary: 146 mg/dL — ABNORMAL HIGH (ref 70–99)
Glucose-Capillary: 153 mg/dL — ABNORMAL HIGH (ref 70–99)
Glucose-Capillary: 136 mg/dL — ABNORMAL HIGH (ref 70–99)

## 2014-01-22 MED ORDER — PREDNISONE 5 MG PO TABS: 5.0000 mg | ORAL_TABLET | Freq: Every day | ORAL | Status: DC

## 2014-01-22 MED ORDER — TRAMADOL HCL 50 MG PO TABS: 50.0000 mg | ORAL_TABLET | Freq: Four times a day (QID) | ORAL | Status: DC | PRN

## 2014-01-22 MED ORDER — LABETALOL HCL 100 MG PO TABS: 100.0000 mg | ORAL_TABLET | Freq: Two times a day (BID) | ORAL | Status: DC

## 2014-01-22 MED ORDER — HYDRALAZINE HCL 50 MG PO TABS
50.0000 mg | ORAL_TABLET | Freq: Once | ORAL | Status: DC
  Filled 2014-01-22: qty 1

## 2014-01-22 MED ORDER — MECLIZINE HCL 12.5 MG PO TABS: 12.5000 mg | ORAL_TABLET | Freq: Three times a day (TID) | ORAL | Status: DC | PRN

## 2014-01-22 NOTE — Clinical Social Work Note (Signed)
CSW has faxed discharge summary to [facility]. Discharge packet has been completed and placed on pt's shadow chart. Pt's daughters aware and agreeable to pt's discharge to [facility]. CSW has arranged for ambulance transportation to [facility].  RN please call report to [facility] at [phone]  Darlyn Chamber, [facility] Clinical Social Worker [phone]

## 2014-01-22 NOTE — Evaluation (Signed)
Physical Therapy Evaluation Patient Details Name: [name] [name] MRN: [id number] DOB: [birth year] Today's Date: 01/22/2014 Time: 6378-5885 PT Time Calculation (min): 27 min   PT Assessment / Plan / Recommendation History of Present Illness  HPI: [name] [name] 80 yo BF PMHx  fibromyalgia, Hx MI, Hx TIA, RAD, HTN, HLD, Diabetes type 2 presenting after an episode of loss of consciousness. I spoke with her caregiver via telephone, he states that the patient was having a normal conversation when she suddenly stiffened. She then slumped over into her chair and her eyes rolled back in her head. She then began having some shaking of her legs. She was reportedly confused for a short period of time after the episode. Review of patient's record shows that patient was seen 12/23/2013 for similar event (syncope vs seizure), however was not admitted. CT of head without contrast 3/11 showed chronic microvascular ischemia, no acute abnormality.  Clinical Impression  Patient continues to remain limited and unsafe with ambulation.  Given lack of progress and patient residing alone, feel that patient will benefit from [facility] upon discharge to maximize functional recovery and improve patient to level of independence. Will continue to see as indicated and progress as tolerated.    PT Assessment  Patient needs continued PT services    Follow Up Recommendations  SNF          Equipment Recommendations  None recommended by PT       Frequency Min 2X/week    Precautions / Restrictions Precautions Precautions: Fall Restrictions Weight Bearing Restrictions: No   Pertinent Vitals/Pain Arthritic pain 6/10, VSS at this time       Mobility  Transfers Overall transfer level: Needs assistance Equipment used: Rolling walker (2 wheeled) Transfers: Sit to/from Stand Sit to Stand: Supervision General transfer comment: VCs for hand placement and safety, patient tries to stand using poor hand placement on RW,  very unsafe, cued for safety several times Ambulation/Gait Ambulation/Gait assistance: Supervision;Min guard Ambulation Distance (Feet): 140 Feet Assistive device: Rolling walker (2 wheeled) Gait Pattern/deviations: Decreased stride length;Trunk flexed Gait velocity: decreased Gait velocity interpretation: Below normal speed for age/gender General Gait Details: Cues for upright posture and improving cadence        PT Diagnosis: Difficulty walking;Abnormality of gait;Generalized weakness  PT Problem List: Decreased strength;Decreased range of motion;Decreased activity tolerance;Decreased balance;Decreased mobility;Obesity     PT Goals(Current goals can be found in the care plan section) Acute Rehab PT Goals Patient Stated Goal: to go home PT Goal Formulation: With patient/family Time For Goal Achievement: 02/01/14 Potential to Achieve Goals: Good  Visit Information  Last PT Received On: 01/22/14 Assistance Needed: +1 History of Present Illness: HPI: [name] [name] 80 yo BF PMHx  fibromyalgia, Hx MI, Hx TIA, RAD, HTN, HLD, Diabetes type 2 presenting after an episode of loss of consciousness. I spoke with her caregiver via telephone, he states that the patient was having a normal conversation when she suddenly stiffened. She then slumped over into her chair and her eyes rolled back in her head. She then began having some shaking of her legs. She was reportedly confused for a short period of time after the episode. Review of patient's record shows that patient was seen 12/23/2013 for similar event (syncope vs seizure), however was not admitted. CT of head without contrast 3/11 showed chronic microvascular ischemia, no acute abnormality.       Prior Functioning  Home Living Family/patient expects to be discharged to:: Private residence Living Arrangements: Alone  Available Help at Discharge: Family;Available 24 hours/day Type of Home: Independent living facility Home Access: Level  entry Home Layout: One level Home Equipment: Hand held shower head;Walker - 4 wheels;Walker - 2 wheels;Cane - single point;Shower seat Additional Comments: has aid 7 days/week who assists with BADLs Prior Function Level of Independence: Independent;Needs assistance Gait / Transfers Assistance Needed: pt reports she was modified independent ADL's / Homemaking Assistance Needed: Pt reports her aid assists her with bathing and dressing.  She stands to wash peri anal area, performs toileting modified independently, but struggles at times pulling adult diapers over hips; was able to complete simple meal prep (reheating meals0 Communication Communication: No difficulties Dominant Hand: Right    Cognition  Cognition Arousal/Alertness: Awake/alert Behavior During Therapy: WFL for tasks assessed/performed Overall Cognitive Status: Within Functional Limits for tasks assessed    Extremity/Trunk Assessment   Generalized weakness Bilateral LEs  Balance General Comments General comments (skin integrity, edema, etc.): Patient continues to remain limited with mobility, very slow to progress.   End of Session PT - End of Session Equipment Utilized During Treatment: Gait belt Activity Tolerance: Patient tolerated treatment well Patient left: in chair;with call bell/phone within reach Nurse Communication: Mobility status  GP     Fabio Asa 01/22/2014, 1:35 PM Charlotte Crumb, PT DPT  [phone]

## 2014-01-22 NOTE — ED Notes (Signed)
Report received from [place], Charity fundraiser.  Pt alert, NAD, calm, interactive, skin W&D, resps e/u, speaking in clear complete sentences, VSS, BP elevated, no dyspnea noted, (denies: pain, HA, blurred vision, sob, dizziness, nausea or other sx). Family at [facility].

## 2014-01-22 NOTE — ED Notes (Signed)
Per EMS: Pt from [facility] with staff noting pt to htn tonight; 230/110 in bilateral arms. Pt discharged appx 4 hours from [facility]. Pt denies any complaint. AO x4. Staff reports pt took Hydralazine 25mg  and Labetalol 100 mg at .

## 2014-01-22 NOTE — Telephone Encounter (Signed)
Patient daughter, Para March, called and stated that patient is in the hospital since last Wednesday and the family was shocked because she was just in our office the day before and got a good report.The hospital told her that she had a strange combination of medications and are taking her off some of them. Being discharged to Rehab today. Daughter has medication concerns and would like to talk with you. Please call # [phone]

## 2014-01-22 NOTE — Discharge Summary (Signed)
Physician Discharge Summary  [name] [name]:[id number] DOB: 03/26/1930 DOA: 01/17/2014  PCP: Oneal Grout, MD  Admit date: 01/17/2014 Discharge date: 01/22/2014  Time spent: 65 minutes  Recommendations for Outpatient Follow-up:  1. Follow up with Oneal Grout, MD in 1 week. Follow up HTN.BP meds have been adjusted.  Discharge Diagnoses:  Principal Problem:   Syncope and collapse Active Problems:   DIABETES MELLITUS, TYPE II   HYPERLIPIDEMIA   HYPERTENSION   Reactive airway disease   Fibromyalgia   HTN (hypertension)   Stroke   Seizures   Discharge Condition: stable and improved.  Diet recommendation: Carb modified  Filed Weights   01/17/14 1700  Weight: 89.359 kg (197 lb)    History of present illness:  [name] [name] 80 yo BF PMHx fibromyalgia, Hx MI, Hx TIA, RAD, HTN, HLD, Diabetes type 2 presenting after an episode of loss of consciousness. I spoke with her caregiver via telephone, he states that the patient was having a normal conversation when she suddenly stiffened. She then slumped over into her chair and her eyes rolled back in her head. She then began having some shaking of her legs. She was reportedly confused for a short period of time after the episode. Review of patient's record shows that patient was seen 12/23/2013 for similar event (syncope vs seizure), however was not admitted. CT of head without contrast 3/11 showed chronic microvascular ischemia, no acute abnormality.      Hospital Course:  #1 syncope  Questionable etiology. Orthostatics were not checked on admission and patient was on a multitude of antihypertensive medications which were held on admission. CT of the head is negative. MRI of the head is negative. Chest x-ray is negative for any acute infection. EEG is negative. Preliminary carotid Dopplers with no significant stenosis. 2-D echo with no valvular abnormalities. Patient was placed on gentle hydration on admission. She did complain of  a spinning sensation and dizziness and a such PT vestibular evaluation was obtained which was negative for vertigo. Patient was also placed on meclizine as needed. Patient improved clinically and her blood pressure medications adjusted. Once patient's blood pressure started to rebound she was started on Toprol and hydralazine. Hydralazine doses were adjusted and Toprol subsequently changed to labetalol 200 mg twice daily. [name] blood pressure became borderline and patient became symptomatic with dizziness and a such labetalol hydralazine doses were decreased in half and patient currently on hydralazine 25 mg 3 times daily as well as labetalol 100 mg twice daily. [name] blood pressure improved patient improved symptomatically and patient was discharged to a skilled nursing facility in stable and improved condition. She'll followup with PCP as outpatient. #2 ??? Seizures  MRI of the head negative. CT head negative. No significant electrolyte abnormalities. EEG has been done and is negative. No further workup was needed at this time.  #3 history of CVA  Patient with chronic right lower extremity weakness at baseline. CT and MRI of the head which was done during this admission were negative. Patient was maintained on aspirin for secondary stroke prevention. PT/OT.  #4 hypertension  Patient had presented with a syncopal episode. Patient was on a multitude of antihypertensive medications and off for this mother been constipated with syncope. [name] antihypertensive medications were held on hospital day one of her blood pressure was borderline. [name] blood pressure improved and had hypertension rebound. Patient was subsequently started on hydralazine and Toprol. Hydralazine doses were adjusted and Toprol was subsequently changed to labetalol. Patient had some bouts  of dizziness with systolic blood pressures in the 100s and a such doses of her blood pressure medications were reduced. Patient improved  clinically and was discharged in stable and improved condition to followup with PCP as outpatient.  #5 diabetes mellitus type 2  Hemoglobin A1c 7.1. Patient was maintained on sliding scale insulin throughout the hospitalization. #6 hyperlipidemia  Fasting lipid panel with total cholesterol 171 and LDL of 77 and HDL of 71.   Procedures: 2-D echo 01/18/2014  MRI/MRA brain 01/17/2014  Chest x-ray 01/17/2014  CT head 01/17/2014  EEG 01/18/2014   Consultations:  None  Discharge Exam: Filed Vitals:   01/22/14 1423  BP: 100/65  Pulse: 110  Temp: 98 F (36.7 C)  Resp: 20    General: NAD Cardiovascular: RRR Respiratory: CTAB  Discharge Instructions  Discharge Orders   Future Appointments Provider Department Dept Phone   05/01/2014 1:30 PM Oneal Grout, MD [facility] [phone]   Future Orders Complete By Expires   Diet Carb Modified  As directed    Discharge instructions  As directed    Comments:     Follow up with Oneal Grout, MD in 1 week.   Increase activity slowly  As directed        Medication List    STOP taking these medications       isosorbide mononitrate 30 MG 24 hr tablet  Commonly known as:  IMDUR     metoprolol succinate 100 MG 24 hr tablet  Commonly known as:  TOPROL-XL     oxyCODONE 5 MG immediate release tablet  Commonly known as:  Oxy IR/ROXICODONE     valsartan-hydrochlorothiazide 160-25 MG per tablet  Commonly known as:  DIOVAN-HCT     verapamil 240 MG 24 hr capsule  Commonly known as:  VERELAN PM     verapamil 240 MG CR tablet  Commonly known as:  CALAN-SR      TAKE these medications       albuterol 108 (90 BASE) MCG/ACT inhaler  Commonly known as:  PROVENTIL HFA;VENTOLIN HFA  Inhale 2 puffs into the lungs every 6 (six) hours as needed for wheezing.     allopurinol 100 MG tablet  Commonly known as:  ZYLOPRIM  Take 100 mg by mouth daily. For gout     aspirin 81 MG tablet  Take 81 mg by mouth daily.     Calcium  1200-1000 MG-UNIT Chew  Chew 1 tablet by mouth every other day.     colchicine 0.6 MG tablet  Take 1 tablet (0.6 mg total) by mouth daily. 1 by mouth daily for Gout     diclofenac sodium 1 % Gel  Commonly known as:  VOLTAREN  Apply 2 g topically 2 (two) times daily as needed (for pain).     hydrALAZINE 25 MG tablet  Commonly known as:  APRESOLINE  Take one tablet by mouth three times daily to control blood pressure     labetalol 100 MG tablet  Commonly known as:  NORMODYNE  Take 1 tablet (100 mg total) by mouth 2 (two) times daily.     meclizine 12.5 MG tablet  Commonly known as:  ANTIVERT  Take 1 tablet (12.5 mg total) by mouth 3 (three) times daily as needed for dizziness.     metFORMIN 500 MG tablet  Commonly known as:  GLUCOPHAGE  Take 500 mg by mouth daily with breakfast. For diabetes     naproxen sodium 220 MG tablet  Commonly known as:  ANAPROX  Take 220 mg by mouth daily as needed (for pain).     polyvinyl alcohol 1.4 % ophthalmic solution  Commonly known as:  LIQUIFILM TEARS  Place 1 drop into both eyes as needed (for dry eyes).     predniSONE 5 MG tablet  Commonly known as:  DELTASONE  Take 1 tablet (5 mg total) by mouth daily before breakfast.     traMADol 50 MG tablet  Commonly known as:  ULTRAM  Take 1-2 tablets (50-100 mg total) by mouth every 6 (six) hours as needed for moderate pain.     triamcinolone cream 0.1 %  Commonly known as:  KENALOG  Apply 1 application topically 2 (two) times daily as needed (to affected area).     triamcinolone cream 0.1 %  Commonly known as:  KENALOG  APPLY TO AFFECTED AREA TWICE DAILY AS NEEDED     VITAMIN D (ERGOCALCIFEROL) PO  Take 1 tablet by mouth every other day.       Allergies  Allergen Reactions  . Cetirizine Hcl Other (See Comments)    unknown  . Codeine Other (See Comments)    unknown  . Fish Allergy   . Indomethacin Other (See Comments)    unknown  . Lyrica [Pregabalin]     hallucinations  .  Methyldopa Other (See Comments)    unknown  . Shellfish Allergy Hives  . Tomato Rash       Follow-up Information   Follow up with Oneal Grout, MD. Schedule an appointment as soon as possible for a visit in 1 week.   Specialty:  Internal Medicine   Contact information:   [address] [place] [address] [phone]        The results of significant diagnostics from this hospitalization (including imaging, microbiology, ancillary and laboratory) are listed below for reference.    Significant Diagnostic Studies: Dg Chest 2 View  01/17/2014   CLINICAL DATA Stroke-like stent  EXAM CHEST  2 VIEW  COMPARISON 01/17/1949 1018 hrs  FINDINGS Cardiac shadow is stable. The lungs are well aerated bilaterally. No focal infiltrate or sizable effusion is seen. No acute bony abnormality is noted.  IMPRESSION No acute abnormality seen.  SIGNATURE  Electronically Signed   By: Alcide Clever [name]   On: 01/17/2014 14:31   Ct Head Wo Contrast  01/17/2014   CLINICAL DATA Syncope  EXAM CT HEAD WITHOUT CONTRAST  TECHNIQUE Contiguous axial images were obtained from the base of the skull through the vertex without intravenous contrast.  COMPARISON CT 11/14/2006  FINDINGS Mild chronic microvascular ischemic change has progressed in the interval. Mild atrophy is similar. Ventricle size is normal.  Negative for acute infarct.  Negative for hemorrhage or mass.  Chronic sinusitis.  IMPRESSION Chronic microvascular ischemia.  No acute abnormality.  Chronic sinusitis.  SIGNATURE  Electronically Signed   By: Marlan Palau [name]   On: 01/17/2014 10:53   Mr Brain Wo Contrast  01/17/2014   CLINICAL DATA Syncopal episode.  Weakness.  EXAM MRI HEAD WITHOUT CONTRAST  MRA HEAD WITHOUT CONTRAST  TECHNIQUE Multiplanar, multiecho pulse sequences of the brain and surrounding structures were obtained without intravenous contrast. Angiographic images of the head were obtained using MRA technique without contrast.  COMPARISON CT  HEAD W/O CM dated 01/17/2014; MR HEAD WO/W CM dated 04/15/2005  FINDINGS MRI HEAD FINDINGS  The diffusion-weighted images demonstrate no evidence for acute or subacute infarction. Mild generalized atrophy is present. There is slight progression of periventricular white  matter disease bilaterally. The ventricles are proportionate to the degree of atrophy. No significant extra-axial fluid collections are present.  Flow is present in the major intracranial arteries. The patient is status post bilateral lens extractions. The paranasal sinuses and mastoid air cells are clear. A heterogeneous nodule is present in the nasopharynx measuring 9 x 12 x 14 mm and increased in size since the prior study.  MRA HEAD FINDINGS  The internal carotid arteries demonstrate minimal irregularity through the cavernous segments bilaterally. The A1 and M1 segments are normal. The MCA bifurcations are within normal limits. There is some attenuation of distal MCA branch vessels bilaterally, left greater than right. No definite anterior communicating artery is evident.  The vertebral arteries are codominant. The PICA origins are visualized and normal. The basilar artery is within normal limits. Both posterior cerebral arteries originate from the basilar tip. The PCA branch vessels are intact.  IMPRESSION 1. Slight progression of periventricular and subcortical white matter change. This likely reflects the sequelae of chronic microvascular ischemia. 2. No acute intracranial abnormality. 3. Mild to moderate distal small vessel disease is most evident within the left MCA territory. 4. No significant proximal stenosis, aneurysm, or branch vessel occlusion.  SIGNATURE  Electronically Signed   By: Gennette Pac [name]   On: 01/17/2014 15:57   Dg Chest Port 1 View  01/17/2014   CLINICAL DATA Recent seizure activity  EXAM PORTABLE CHEST - 1 VIEW  COMPARISON 12/23/2013  FINDINGS Cardiac shadow is enlarged but stable. The lungs are well aerated  bilaterally. Degenerative changes of the shoulder joints are seen. No acute bony abnormality is noted.  IMPRESSION No acute abnormality noted.  SIGNATURE  Electronically Signed   By: Alcide Clever [name]   On: 01/17/2014 10:38   Mr Maxine Glenn Head/brain Wo Cm  01/17/2014   CLINICAL DATA Syncopal episode.  Weakness.  EXAM MRI HEAD WITHOUT CONTRAST  MRA HEAD WITHOUT CONTRAST  TECHNIQUE Multiplanar, multiecho pulse sequences of the brain and surrounding structures were obtained without intravenous contrast. Angiographic images of the head were obtained using MRA technique without contrast.  COMPARISON CT HEAD W/O CM dated 01/17/2014; MR HEAD WO/W CM dated 04/15/2005  FINDINGS MRI HEAD FINDINGS  The diffusion-weighted images demonstrate no evidence for acute or subacute infarction. Mild generalized atrophy is present. There is slight progression of periventricular white matter disease bilaterally. The ventricles are proportionate to the degree of atrophy. No significant extra-axial fluid collections are present.  Flow is present in the major intracranial arteries. The patient is status post bilateral lens extractions. The paranasal sinuses and mastoid air cells are clear. A heterogeneous nodule is present in the nasopharynx measuring 9 x 12 x 14 mm and increased in size since the prior study.  MRA HEAD FINDINGS  The internal carotid arteries demonstrate minimal irregularity through the cavernous segments bilaterally. The A1 and M1 segments are normal. The MCA bifurcations are within normal limits. There is some attenuation of distal MCA branch vessels bilaterally, left greater than right. No definite anterior communicating artery is evident.  The vertebral arteries are codominant. The PICA origins are visualized and normal. The basilar artery is within normal limits. Both posterior cerebral arteries originate from the basilar tip. The PCA branch vessels are intact.  IMPRESSION 1. Slight progression of periventricular and  subcortical white matter change. This likely reflects the sequelae of chronic microvascular ischemia. 2. No acute intracranial abnormality. 3. Mild to moderate distal small vessel disease is most evident within the left MCA territory.  4. No significant proximal stenosis, aneurysm, or branch vessel occlusion.  SIGNATURE  Electronically Signed   By: Gennette Pac [name]   On: 01/17/2014 15:57    Microbiology: No results found for this or any previous visit (from the past 240 hour(s)).   Labs: Basic Metabolic Panel:  Recent Labs Lab 01/17/14 1020 01/17/14 1307 01/20/14 0515 01/21/14 0452 01/22/14 0528  NA 137  --  137 135* 137  K 3.6*  --  3.6* 4.0 4.3  CL 98  --  97 96 101  CO2 24  --  23 22 24   GLUCOSE 151*  --  141* 148* 124*  BUN 20  --  20 19 19   CREATININE 0.66 0.64 0.80 0.75 0.75  CALCIUM 9.7  --  9.8 9.7 9.7   Liver Function Tests: No results found for this basename: AST, ALT, ALKPHOS, BILITOT, PROT, ALBUMIN,  in the last 168 hours No results found for this basename: LIPASE, AMYLASE,  in the last 168 hours No results found for this basename: AMMONIA,  in the last 168 hours CBC:  Recent Labs Lab 01/17/14 1020 01/17/14 1307  WBC 11.1* 10.3  NEUTROABS 7.4  --   HGB 10.6* 10.6*  HCT 31.5* 31.4*  MCV 86.1 85.8  PLT 329 321   Cardiac Enzymes:  Recent Labs Lab 01/17/14 1020 01/17/14 1307 01/17/14 2034 01/18/14 0043  TROPONINI <0.30 <0.30 <0.30 <0.30   BNP: BNP (last 3 results)  Recent Labs  01/17/14 1307  PROBNP 169.1   CBG:  Recent Labs Lab 01/21/14 1103 01/21/14 1618 01/21/14 2210 01/22/14 0633 01/22/14 1128  GLUCAP 142* 115* 123* 146* 153*       Signed:  Trayce Caravello MD [facility] 01/22/2014, 3:52 PM

## 2014-01-22 NOTE — Clinical Social Work Psychosocial (Signed)
Clinical Social Work Department BRIEF PSYCHOSOCIAL ASSESSMENT 01/22/2014  Patient:  [name] [name], [name] [name]     Account Number:  [id number]     Admit date:  01/17/2014  Clinical Social Worker:  Sherre Lain  Date/Time:  01/22/2014 10:56 AM  Referred by:  Physician  Date Referred:  01/22/2014 Referred for  SNF Placement   Other Referral:   none.   Interview type:  Family Other interview type:   CSW left message with pt's daughter, Para March [phone]), but spoke to pt's daughter Elease Hashimoto [phone]).    PSYCHOSOCIAL DATA Living Status:  FAMILY Admitted from facility:   Level of care:   Primary support name:  Elease Hashimoto Surgeon Primary support relationship to patient:  CHILD, ADULT Degree of support available:   Strong support system. Pt lives with daughter.    CURRENT CONCERNS Current Concerns  Post-Acute Placement   Other Concerns:   none.    SOCIAL WORK ASSESSMENT / PLAN CSW received consult that pt will need SNF placement at time of discharge. Per RN, pt was to return to daughter's home at time of discharge but daughter stated that she could not accomodate pt at home. CSW spoke with pt's daughter (main caregiver - Elease Hashimoto). CSW and Elease Hashimoto discussed above information. Per Elease Hashimoto, pt is currently unable to "go up and down the stairs, making her a huge fall risk." Elease Hashimoto stated that she would prefer for her mother to discharge to a SNF prior to returning to daughter's home. Per Elease Hashimoto, pt has previously received therapy at [facility] and Elease Hashimoto would prefer for pt to be placed there. CSW to continue to follow and assist with discharge planning needs.   Assessment/plan status:  Psychosocial Support/Ongoing Assessment of Needs Other assessment/ plan:   none.   Information/referral to community resources:   [facility] SNF placement bed offers.    PATIENTS/FAMILYS RESPONSE TO PLAN OF CARE: Pt's daughter understanding and agreeable to CSW plan of care.  CSW informed Elease Hashimoto that pt was possibly ready for discharge from [facility] on 3/16. Elease Hashimoto stated that she understood and was agreeable to pt being discharged on 3/16 via ambulance if pt was medically stable.     Darlyn Chamber, LCSWA Clinical Social Worker [phone]

## 2014-01-22 NOTE — Clinical Social Work Placement (Addendum)
Clinical Social Work Department CLINICAL SOCIAL WORK PLACEMENT NOTE 01/22/2014  Patient:  [name] [name], [name] [name]  Account Number:  [id number] Admit date:  01/17/2014  Clinical Social Worker:  Irving Burton [place], LCSWA  Date/time:  01/22/2014 11:04 AM  Clinical Social Work is seeking post-discharge placement for this patient at the following level of care:   SKILLED NURSING   (*CSW will update this form in Epic as items are completed)   01/22/2014  Patient/family provided with [name] Department of Clinical Social Works list of facilities offering this level of care within the geographic area requested by the patient (or if unable, by the patients family).  01/22/2014  Patient/family informed of their freedom to choose among providers that offer the needed level of care, that participate in Medicare, Medicaid or managed care program needed by the patient, have an available bed and are willing to accept the patient.  01/22/2014  Patient/family informed of [facility] ownership interest in [facility], as well as of the fact that they are under no obligation to receive care at this facility.  PASARR submitted to EDS on  PASARR number received from EDS on   FL2 transmitted to all facilities in geographic area requested by pt/family on  01/22/2014 FL2 transmitted to all facilities within larger geographic area on   Patient informed that his/her managed care company has contracts with or will negotiate with  certain facilities, including the following:     Patient/family informed of bed offers received:  01/22/2014 Patient chooses bed at [facility] Physician recommends and patient chooses bed at    Patient to be transferred to  on  01/22/2014 Patient to be transferred to facility by [facility]  The following physician request were entered in Epic:   Additional Comments: PASARR existing.  Darlyn Chamber, LCSWA Clinical Social Worker [phone]

## 2014-01-22 NOTE — Progress Notes (Signed)
Report called and given to Kim(RN) in [place], awaiting on transportation from [place].

## 2014-01-22 NOTE — ED Provider Notes (Signed)
CSN: [id number]     Arrival date & time 01/22/14  2227 History   First MD Initiated Contact with Patient 01/22/14 2303     Chief Complaint  Patient presents with  . Hypertension     (Consider location/radiation/quality/duration/timing/severity/associated sxs/prior Treatment) HPI Visual history of hypertension was recently admitted for syncopal episode. Her blood pressure was very labile while admitted. She was started on labetalol and hydralazine and discharged to [address] this afternoon around 6 PM. Staff noted her blood pressure 230/110 this evening. Patient denies any dizziness, vision changes, headache, weakness, numbness, chest pain, shortness of breath or any other symptoms. She took her hydralazine dose of 25 mg of labetalol dose of 100 mg at 8:00pm this evening.  Past Medical History  Diagnosis Date  . Hypertension   . First degree atrioventricular block   . Onychia and paronychia of toe   . Muscle weakness (generalized)   . Allergic rhinitis due to pollen   . Gout, unspecified   . Disorder of bone and cartilage, unspecified   . Edema   . Hyperlipidemia   . Unspecified essential hypertension   . Coronary atherosclerosis of native coronary artery   . Other malaise and fatigue   . Cough   . Other fall   . Unspecified vitamin D deficiency   . TIA (transient ischemic attack)     "a few one summer" (08/07/2013)  . Myocardial infarction ~ 1970  . Exertional shortness of breath     "sometimes" (08/07/2013)  . Type II diabetes mellitus     "fasting 90-110s" (08/07/2013)  . Osteoarthrosis, unspecified whether generalized or localized, unspecified site   . Prepatellar bursitis   . Arthritis     "in q joint" (08/07/2013)   Past Surgical History  Procedure Laterality Date  . Tonsillectomy  08/1974  . Replacement total knee Right 09/2005  . Shoulder open rotator cuff repair Right 07/1999  . Appendectomy  1960  . Abdominal hysterectomy  04/1980  . Transthoracic echocardiogram   2003    EF 55-65%; mild concentric LVH  . Joint replacement    . Total knee arthroplasty Left 08/07/2013  . Cataract extraction w/ intraocular lens  implant, bilateral Bilateral ~ 2012  . Total knee arthroplasty Left 08/07/2013    Procedure: TOTAL KNEE ARTHROPLASTY- LEFT;  Surgeon: Dannielle Huh, MD;  Location: [facility] OR;  Service: Orthopedics;  Laterality: Left;  . Cardiac catheterization  2003   Family History  Problem Relation Age of Onset  . Heart attack Father    History  Substance Use Topics  . Smoking status: Never Smoker   . Smokeless tobacco: Never Used  . Alcohol Use: No   OB History   Grav Para Term Preterm Abortions TAB SAB Ect Mult Living                 Review of Systems  Constitutional: Negative for fever and chills.  Eyes: Negative for visual disturbance.  Respiratory: Negative for cough and shortness of breath.   Cardiovascular: Negative for chest pain and palpitations.  Gastrointestinal: Negative for nausea, vomiting and abdominal pain.  Neurological: Negative for dizziness, seizures, syncope, weakness, light-headedness, numbness and headaches.  All other systems reviewed and are negative.      Allergies  Cetirizine hcl; Codeine; Fish allergy; Indomethacin; Lyrica; Methyldopa; Shellfish allergy; and Tomato  Home Medications   Current Outpatient Rx  Name  Route  Sig  Dispense  Refill  . albuterol (PROVENTIL HFA;VENTOLIN HFA) 108 (90 BASE) MCG/ACT  inhaler   Inhalation   Inhale 2 puffs into the lungs every 6 (six) hours as needed for wheezing.   1 Inhaler   0   . allopurinol (ZYLOPRIM) 100 MG tablet   Oral   Take 100 mg by mouth daily. For gout         . aspirin 81 MG tablet   Oral   Take 81 mg by mouth daily.         . calcium-vitamin D (OSCAL WITH D) 500-200 MG-UNIT per tablet   Oral   Take 1 tablet by mouth 2 (two) times daily.         . cholecalciferol (VITAMIN D) 1000 UNITS tablet   Oral   Take 1,000 Units by mouth every other day.          . colchicine 0.6 MG tablet   Oral   Take 0.6 mg by mouth daily.         . hydrALAZINE (APRESOLINE) 25 MG tablet   Oral   Take 25 mg by mouth 3 (three) times daily.         . insulin lispro (HUMALOG) 100 UNIT/ML injection   Subcutaneous   Inject 2-15 Units into the skin 3 (three) times daily before meals. Sliding scale         . labetalol (NORMODYNE) 100 MG tablet   Oral   Take 1 tablet (100 mg total) by mouth 2 (two) times daily.   62 tablet   0   . meclizine (ANTIVERT) 12.5 MG tablet   Oral   Take 1 tablet (12.5 mg total) by mouth 3 (three) times daily as needed for dizziness.   30 tablet   0   . metFORMIN (GLUCOPHAGE) 500 MG tablet   Oral   Take 500 mg by mouth daily with breakfast. For diabetes         . naproxen sodium (ANAPROX) 220 MG tablet   Oral   Take 220 mg by mouth daily as needed (for pain).         . polyvinyl alcohol (LIQUIFILM TEARS) 1.4 % ophthalmic solution   Both Eyes   Place 1 drop into both eyes as needed (for dry eyes).         . predniSONE (DELTASONE) 5 MG tablet   Oral   Take 1 tablet (5 mg total) by mouth daily before breakfast.   31 tablet   0   . traMADol (ULTRAM) 50 MG tablet   Oral   Take 1-2 tablets (50-100 mg total) by mouth every 6 (six) hours as needed for moderate pain.   20 tablet   0    BP 201/99  Pulse 98  Temp(Src) 98.7 F (37.1 C) (Oral)  Resp 19  Ht 5\' 4"  (1.626 m)  SpO2 95% Physical Exam  Nursing note and vitals reviewed. Constitutional: She is oriented to person, place, and time. She appears well-developed and well-nourished. No distress.  HENT:  Head: Normocephalic and atraumatic.  Mouth/Throat: Oropharynx is clear and moist.  Eyes: EOM are normal. Pupils are equal, round, and reactive to light.  Neck: Normal range of motion. Neck supple.  Cardiovascular: Normal rate and regular rhythm.   Pulmonary/Chest: Effort normal and breath sounds normal. No respiratory distress. She has no wheezes.  She has no rales.  Abdominal: Soft. Bowel sounds are normal. She exhibits no distension and no mass. There is no tenderness. There is no rebound and no guarding.  Musculoskeletal: Normal range of motion. She  exhibits no edema and no tenderness.  Neurological: She is alert and oriented to person, place, and time.  Patient is alert and oriented x3 with clear, goal oriented speech. Patient has 5/5 motor in all extremities. Sensation is intact to light touch.    Skin: Skin is warm and dry. No rash noted. No erythema.  Psychiatric: She has a normal mood and affect. Her behavior is normal.    ED Course  Procedures (including critical care time) Labs Review Labs Reviewed  CBC WITH DIFFERENTIAL  COMPREHENSIVE METABOLIC PANEL   Imaging Review No results found.   EKG Interpretation   Date/Time:  Monday January 22 2014 23:31:51 EDT Ventricular Rate:  99 PR Interval:  166 QRS Duration: 82 QT Interval:  364 QTC Calculation: 467 R Axis:   -20 Text Interpretation:  Sinus rhythm Left ventricular hypertrophy Inferior  infarct, old Confirmed by Ranae Palms  MD, Chidinma Clites ([id number]) on 01/22/2014  11:39:44 PM      MDM   Final diagnoses:  None   [name] hypertension improved significantly in the emergency department. She remained asymptomatic. She has a normal neurologic exam. Discussed with the [facility] hospitalists and suggested starting patient on low-dose amlodipine. She can be discharged back to the [facility]. Return precautions given. Patient voices understanding and agrees with plan.    Loren Racer, MD 01/23/14 [phone]

## 2014-01-23 ENCOUNTER — Encounter: Payer: Self-pay | Admitting: *Deleted

## 2014-01-23 ENCOUNTER — Non-Acute Institutional Stay (SKILLED_NURSING_FACILITY): Payer: Medicare Other | Admitting: Adult Health

## 2014-01-23 DIAGNOSIS — E119 Type 2 diabetes mellitus without complications: Secondary | ICD-10-CM

## 2014-01-23 DIAGNOSIS — I10 Essential (primary) hypertension: Secondary | ICD-10-CM

## 2014-01-23 DIAGNOSIS — I6350 Cerebral infarction due to unspecified occlusion or stenosis of unspecified cerebral artery: Secondary | ICD-10-CM

## 2014-01-23 DIAGNOSIS — M109 Gout, unspecified: Secondary | ICD-10-CM

## 2014-01-23 DIAGNOSIS — I2510 Atherosclerotic heart disease of native coronary artery without angina pectoris: Secondary | ICD-10-CM

## 2014-01-23 DIAGNOSIS — M069 Rheumatoid arthritis, unspecified: Secondary | ICD-10-CM

## 2014-01-23 DIAGNOSIS — I639 Cerebral infarction, unspecified: Secondary | ICD-10-CM

## 2014-01-23 DIAGNOSIS — R55 Syncope and collapse: Secondary | ICD-10-CM

## 2014-01-23 LAB — CBC WITH DIFFERENTIAL/PLATELET
Basophils Absolute: 0 10*3/uL (ref 0.0–0.1)
Basophils Relative: 0 % (ref 0–1)
Eosinophils Absolute: 0.9 10*3/uL — ABNORMAL HIGH (ref 0.0–0.7)
Eosinophils Relative: 9 % — ABNORMAL HIGH (ref 0–5)
HCT: 30.6 % — ABNORMAL LOW (ref 36.0–46.0)
Hemoglobin: 10.5 g/dL — ABNORMAL LOW (ref 12.0–15.0)
Lymphocytes Relative: 30 % (ref 12–46)
Lymphs Abs: 2.9 10*3/uL (ref 0.7–4.0)
MCH: 29.3 pg (ref 26.0–34.0)
MCHC: 34.3 g/dL (ref 30.0–36.0)
MCV: 85.5 fL (ref 78.0–100.0)
Monocytes Absolute: 0.9 10*3/uL (ref 0.1–1.0)
Monocytes Relative: 9 % (ref 3–12)
Neutro Abs: 5.2 10*3/uL (ref 1.7–7.7)
Neutrophils Relative %: 53 % (ref 43–77)
Platelets: 380 10*3/uL (ref 150–400)
RBC: 3.58 MIL/uL — ABNORMAL LOW (ref 3.87–5.11)
RDW: 16.6 % — ABNORMAL HIGH (ref 11.5–15.5)
WBC: 9.9 10*3/uL (ref 4.0–10.5)

## 2014-01-23 LAB — COMPREHENSIVE METABOLIC PANEL
ALT: 11 U/L (ref 0–35)
AST: 16 U/L (ref 0–37)
Albumin: 3 g/dL — ABNORMAL LOW (ref 3.5–5.2)
Alkaline Phosphatase: 71 U/L (ref 39–117)
BUN: 22 mg/dL (ref 6–23)
CO2: 22 mEq/L (ref 19–32)
Calcium: 9.6 mg/dL (ref 8.4–10.5)
Chloride: 96 mEq/L (ref 96–112)
Creatinine, Ser: 0.73 mg/dL (ref 0.50–1.10)
GFR calc Af Amer: >90 mL/min (ref >90)
GFR calc non Af Amer: 78 mL/min — ABNORMAL LOW (ref >90)
Glucose, Bld: 111 mg/dL — ABNORMAL HIGH (ref 70–99)
Potassium: 4.3 mEq/L (ref 3.7–5.3)
Sodium: 134 mEq/L — ABNORMAL LOW (ref 137–147)
Total Bilirubin: 0.2 mg/dL — ABNORMAL LOW (ref 0.3–1.2)
Total Protein: 7.3 g/dL (ref 6.0–8.3)

## 2014-01-23 MED ORDER — HYDRALAZINE HCL 50 MG PO TABS
50.0000 mg | ORAL_TABLET | Freq: Once | ORAL | Status: AC
  Administered 2014-01-23: 50 mg via ORAL
  Filled 2014-01-23: qty 1

## 2014-01-23 MED ORDER — AMLODIPINE BESYLATE 5 MG PO TABS: 5.0000 mg | ORAL_TABLET | Freq: Every day | ORAL | Status: DC

## 2014-01-23 NOTE — Telephone Encounter (Signed)
Called and left message for call back.

## 2014-01-23 NOTE — ED Notes (Signed)
Alert, NAD, calm, interactive, denies sx, hydralazine given, lab at [facility], family at [facility]. VSS.

## 2014-01-23 NOTE — Telephone Encounter (Signed)
Patient is at [facility] now and very disappointed with care. Daughter wants you to call her. Wants to take her out of the facility and bring her back home because she thinks she is not being cared for properly. She had to go back to ER last night for her BP.

## 2014-01-23 NOTE — ED Notes (Signed)
EDP and hospitalist in to see pt, at [facility]. VSS. BP elevated, but improved. Plan discussed. Daughter present at [facility].

## 2014-01-23 NOTE — ED Notes (Signed)
"  feel OK, about the same, but I wasn't feeling bad", denies sx. Alert, NAD, calm, interactive, VSS, BP improved. Family at [facility].

## 2014-01-23 NOTE — Discharge Instructions (Signed)

## 2014-01-23 NOTE — ED Notes (Signed)
Report given to PTAR, PTAR here.

## 2014-01-23 NOTE — Telephone Encounter (Signed)
Called patient's daughter and left a message for call back.

## 2014-01-23 NOTE — ED Notes (Signed)
Report called to [facility], staff at [facility]. Family and pt OK with d/c plan. PTAR called for transport. Pending arrival. VSS, denies sx. Family x2 at [facility]. Up to b/r by w/c.

## 2014-01-24 NOTE — Telephone Encounter (Signed)
Spoke with daughter Galen Daft. She had concerns about patient's prednisone and bp medications being held completely in the hospital. Explained that bp medication were held with her having low blood pressure. She also mentions bout her bp being high at [facility] and pt being sent to ER for med management. Reviewed notes from hospital. There was question about duration of stay in [facility]. Explained that pt is there for STR and once her strength is improved, she will be sent home with home health services and this could vary between 2-3 weeks. Daughter understands this. She feels better that the course of her mother's illness has been explained to her. She has no further concerns.

## 2014-01-25 ENCOUNTER — Non-Acute Institutional Stay (SKILLED_NURSING_FACILITY): Payer: Medicare Other | Admitting: Internal Medicine

## 2014-01-25 ENCOUNTER — Encounter: Payer: Self-pay | Admitting: Internal Medicine

## 2014-01-25 DIAGNOSIS — I6350 Cerebral infarction due to unspecified occlusion or stenosis of unspecified cerebral artery: Secondary | ICD-10-CM

## 2014-01-25 DIAGNOSIS — I639 Cerebral infarction, unspecified: Secondary | ICD-10-CM

## 2014-01-25 DIAGNOSIS — E119 Type 2 diabetes mellitus without complications: Secondary | ICD-10-CM

## 2014-01-25 DIAGNOSIS — R569 Unspecified convulsions: Secondary | ICD-10-CM

## 2014-01-25 DIAGNOSIS — R55 Syncope and collapse: Secondary | ICD-10-CM

## 2014-01-25 DIAGNOSIS — I2510 Atherosclerotic heart disease of native coronary artery without angina pectoris: Secondary | ICD-10-CM

## 2014-01-25 DIAGNOSIS — E785 Hyperlipidemia, unspecified: Secondary | ICD-10-CM

## 2014-01-25 DIAGNOSIS — M109 Gout, unspecified: Secondary | ICD-10-CM

## 2014-01-25 DIAGNOSIS — I10 Essential (primary) hypertension: Secondary | ICD-10-CM

## 2014-01-25 DIAGNOSIS — M069 Rheumatoid arthritis, unspecified: Secondary | ICD-10-CM

## 2014-01-25 NOTE — Progress Notes (Signed)
MRN: [id number] Name: [name] [name]  Sex: female Age: 80 y.o. DOB: Dec 02, 1929  [facility]: [facility] Facility/Room: 406-1 Level Of Care: SNF Provider: Merrilee Seashore D Emergency Contacts: Extended Emergency Contact Information Primary Emergency Contact: Hampton,Jeannette Address: [address]          [place], [place] [address] [place] of [place] Home Phone: [phone] Mobile Phone: [phone] Relation: Daughter Secondary Emergency Contact: [name] Address: [address]          [place], [place] [address] [place] of [place] Home Phone: [phone] Mobile Phone: [phone] Relation: Daughter  Code Status: FULL  Allergies: Cetirizine hcl; Codeine; Fish allergy; Indomethacin; Lyrica; Methyldopa; Shellfish allergy; and Tomato  Chief Complaint  Patient presents with  . nursing home admission    HPI: Patient is 80 y.o. female who is admitted for OT/PT after a syncopal episode and BP concerns.  Past Medical History  Diagnosis Date  . Hypertension   . First degree atrioventricular block   . Onychia and paronychia of toe   . Muscle weakness (generalized)   . Allergic rhinitis due to pollen 04/27/2007  . Gout, unspecified 04/19/2013  . Disorder of bone and cartilage, unspecified   . Edema 05/03/2013  . Hyperlipidemia 04/27/2007  . Unspecified essential hypertension 08/08/2013  . Coronary atherosclerosis of native coronary artery   . Other malaise and fatigue   . Cough   . Other fall   . Unspecified vitamin D deficiency   . TIA (transient ischemic attack)     "a few one summer" (08/07/2013)  . Myocardial infarction ~ 1970  . Exertional shortness of breath     "sometimes" (08/07/2013)  . Type II diabetes mellitus     "fasting 90-110s" (08/07/2013)  . Osteoarthrosis, unspecified whether generalized or localized, unspecified site 04/27/2007  . Prepatellar bursitis   . Arthritis     "in q joint" (08/07/2013)  . Stroke 01/17/2014  . Seizures      Past Surgical History  Procedure Laterality Date  . Tonsillectomy  08/1974  . Replacement total knee Right 09/2005  . Shoulder open rotator cuff repair Right 07/1999  . Appendectomy  1960  . Abdominal hysterectomy  04/1980  . Transthoracic echocardiogram  2003    EF 55-65%; mild concentric LVH  . Joint replacement    . Total knee arthroplasty Left 08/07/2013  . Cataract extraction w/ intraocular lens  implant, bilateral Bilateral ~ 2012  . Total knee arthroplasty Left 08/07/2013    Procedure: TOTAL KNEE ARTHROPLASTY- LEFT;  Surgeon: Dannielle Huh, MD;  Location: [facility] OR;  Service: Orthopedics;  Laterality: Left;  . Cardiac catheterization  2003      Medication List       This list is accurate as of: 01/25/14 10:44 PM.  Always use your most recent med list.               albuterol 108 (90 BASE) MCG/ACT inhaler  Commonly known as:  PROVENTIL HFA;VENTOLIN HFA  Inhale 2 puffs into the lungs every 6 (six) hours as needed for wheezing.     allopurinol 100 MG tablet  Commonly known as:  ZYLOPRIM  Take 100 mg by mouth daily. For gout     amLODipine 5 MG tablet  Commonly known as:  NORVASC  Take 1 tablet (5 mg total) by mouth daily.     aspirin 81 MG tablet  Take 81 mg by mouth daily.     calcium-vitamin D 500-200 MG-UNIT per tablet  Commonly known  as:  OSCAL WITH D  Take 1 tablet by mouth 2 (two) times daily.     cholecalciferol 1000 UNITS tablet  Commonly known as:  VITAMIN D  Take 1,000 Units by mouth every other day.     colchicine 0.6 MG tablet  Take 0.6 mg by mouth daily. For gout     diclofenac sodium 1 % Gel  Commonly known as:  VOLTAREN  Apply 2 g topically 2 (two) times daily. For pain     hydrALAZINE 25 MG tablet  Commonly known as:  APRESOLINE  Take 25 mg by mouth 3 (three) times daily. For HTN     insulin lispro 100 UNIT/ML injection  Commonly known as:  HUMALOG  Inject 2-15 Units into the skin 3 (three) times daily before meals. Sliding scale      labetalol 100 MG tablet  Commonly known as:  NORMODYNE  Take 1 tablet (100 mg total) by mouth 2 (two) times daily.     meclizine 12.5 MG tablet  Commonly known as:  ANTIVERT  Take 1 tablet (12.5 mg total) by mouth 3 (three) times daily as needed for dizziness.     metFORMIN 500 MG tablet  Commonly known as:  GLUCOPHAGE  Take 500 mg by mouth daily with breakfast. For diabetes     naproxen sodium 220 MG tablet  Commonly known as:  ANAPROX  Take 220 mg by mouth daily as needed (for pain).     polyvinyl alcohol 1.4 % ophthalmic solution  Commonly known as:  LIQUIFILM TEARS  Place 1 drop into both eyes as needed (for dry eyes).     predniSONE 5 MG tablet  Commonly known as:  DELTASONE  Take 1 tablet (5 mg total) by mouth daily before breakfast.     traMADol 50 MG tablet  Commonly known as:  ULTRAM  Take 1-2 tablets (50-100 mg total) by mouth every 6 (six) hours as needed for moderate pain.     triamcinolone cream 0.1 %  Commonly known as:  KENALOG  Apply 1 application topically 2 (two) times daily. For affected area        No orders of the defined types were placed in this encounter.    Immunization History  Administered Date(s) Administered  . Influenza Whole 09/06/2012  . Influenza,inj,Quad PF,36+ Mos 09/12/2013  . Pneumococcal Polysaccharide-23 04/13/2005, 11/09/2009  . Td 04/13/2005    History  Substance Use Topics  . Smoking status: Never Smoker   . Smokeless tobacco: Never Used  . Alcohol Use: No    Family history is noncontributory    Review of Systems  DATA OBTAINED: from patient; only c/o was a scratchy throat after being in ED all night several days ago GENERAL: Feels well no fevers, fatigue, appetite changes SKIN: No itching, rash or wounds EYES: No eye pain, redness, discharge EARS: No earache, tinnitus, change in hearing NOSE: No congestion, bleeding  MOUTH/THROAT: No mouth or tooth pain, scratchy throat RESPIRATORY: No cough, wheezing,  SOB CARDIAC: No chest pain, palpitations, lower extremity edema  GI: No abdominal pain, No N/V/D or constipation, No heartburn or reflux  GU: No dysuria, frequency or urgency, or incontinence  MUSCULOSKELETAL: No unrelieved bone/joint pain NEUROLOGIC: No headache, dizziness or focal weakness PSYCHIATRIC: No overt anxiety or sadness. Sleeps well. No behavior issue.   Filed Vitals:   01/25/14 2222  BP: 175/98  Pulse: 102  Temp: 99.8 F (37.7 C)  Resp: 18    Physical Exam  GENERAL APPEARANCE: Alert,  conversant. Appropriately groomed. No acute distress.  SKIN: No diaphoresis rash, or wounds HEAD: Normocephalic, atraumatic  EYES: Conjunctiva/lids clear. Pupils round, reactive. EOMs intact.  EARS: External exam WNL, canals clear. Hearing grossly normal.  NOSE: No deformity or discharge.  MOUTH/THROAT: Lips w/o lesions RESPIRATORY: Breathing is even, unlabored. Lung sounds are clear   CARDIOVASCULAR: Heart RRR no murmurs, rubs or gallops. No peripheral edema.  GASTROINTESTINAL: Abdomen is soft, non-tender, not distended w/ normal bowel sounds. GENITOURINARY: Bladder non tender, not distended  MUSCULOSKELETAL: No abnormal joints or musculature NEUROLOGIC: Oriented X3. Cranial nerves 2-12 grossly intact. Moves all extremities, a little less R leg PSYCHIATRIC: Mood and affect appropriate to situation, no behavioral issues  Patient Active Problem List   Diagnosis Date Noted  . Stroke 01/17/2014  . Syncope and collapse 01/17/2014  . Seizures 01/17/2014  . Rheumatoid arthritis 12/19/2013  . CAD (coronary artery disease) 10/18/2013  . Unspecified arthropathy, lower leg 09/13/2013  . Other specified disease of white blood cells 09/13/2013  . Cold intolerance 09/12/2013  . Anemia 09/12/2013  . Need for prophylactic vaccination and inoculation against influenza 09/12/2013  . Acute blood loss anemia 08/15/2013  . Acute post-hemorrhagic anemia 08/09/2013  . HTN (hypertension) 08/08/2013   . Hyponatremia 08/08/2013  . Preoperative clearance 07/11/2013  . Myalgia and myositis 05/30/2013  . Fibromyalgia 05/10/2013  . Acute gout 05/03/2013  . Acute bronchitis 05/03/2013  . Edema 05/03/2013  . Reactive airway disease 04/19/2013  . Gout 04/19/2013  . DIABETES MELLITUS, TYPE II 04/27/2007  . HYPERLIPIDEMIA 04/27/2007  . HYPERTENSION 04/27/2007  . ALLERGIC RHINITIS 04/27/2007  . DIVERTICULOSIS, COLON 04/27/2007  . OSTEOARTHRITIS 04/27/2007    CBC    Component Value Date/Time   WBC 9.9 01/22/2014 2325   WBC 10.9* 01/12/2014 1148   RBC 3.58* 01/22/2014 2325   RBC 4.03 01/12/2014 1148   HGB 10.5* 01/22/2014 2325   HCT 30.6* 01/22/2014 2325   PLT 380 01/22/2014 2325   MCV 85.5 01/22/2014 2325   LYMPHSABS 2.9 01/22/2014 2325   LYMPHSABS 2.6 01/12/2014 1148   MONOABS 0.9 01/22/2014 2325   EOSABS 0.9* 01/22/2014 2325   EOSABS 0.6* 01/12/2014 1148   BASOSABS 0.0 01/22/2014 2325   BASOSABS 0.1 01/12/2014 1148    CMP     Component Value Date/Time   NA 134* 01/22/2014 2325   NA 137 01/12/2014 1148   K 4.3 01/22/2014 2325   CL 96 01/22/2014 2325   CO2 22 01/22/2014 2325   GLUCOSE 111* 01/22/2014 2325   GLUCOSE 124* 01/12/2014 1148   BUN 22 01/22/2014 2325   BUN 16 01/12/2014 1148   CREATININE 0.73 01/22/2014 2325   CALCIUM 9.6 01/22/2014 2325   PROT 7.3 01/22/2014 2325   PROT 7.4 01/12/2014 1148   ALBUMIN 3.0* 01/22/2014 2325   AST 16 01/22/2014 2325   ALT 11 01/22/2014 2325   ALKPHOS 71 01/22/2014 2325   BILITOT 0.2* 01/22/2014 2325   GFRNONAA 78* 01/22/2014 2325   GFRAA >90 01/22/2014 2325    Assessment and Plan  Syncope and collapse #1 syncope  Questionable etiology. Orthostatics were not checked on admission and patient was on a multitude of antihypertensive medications which were held on admission. CT of the head is negative. MRI of the head is negative. Chest x-ray is negative for any acute infection. EEG is negative. Preliminary carotid Dopplers with no significant stenosis. 2-D echo with  no valvular abnormalities. Patient was placed on gentle hydration on admission. She did complain of a spinning  sensation and dizziness and a such PT vestibular evaluation was obtained which was negative for vertigo. Patient was also placed on meclizine as needed. Patient improved clinically and her blood pressure medications adjusted. Once patient's blood pressure started to rebound she was started on Toprol and hydralazine. Hydralazine doses were adjusted and Toprol subsequently changed to labetalol 200 mg twice daily. [name] blood pressure became borderline and patient became symptomatic with dizziness and a such labetalol hydralazine doses were decreased in half and patient currently on hydralazine 25 mg 3 times daily as well as labetalol 100 mg twice daily. [name] blood pressure improved patient improved symptomatically and patient was discharged to a skilled nursing facility in stable and improved condition.    Seizures Scans and EEG neg ;no further w/u was deemed necessary  Stroke H/o prior CVA with chronic RLE weakness that did not change;w/u neg; ASA for prophylaxis  HYPERTENSION Pt was at first hypotensive in hospital, then HTN and BP meds added back. Pt was admitted to SNF then that evening was in ED with BP elevated again. BP meds are being tweeked to gain control  DIABETES MELLITUS, TYPE II Hba1c was 7.1 on home glucophage 500 mg daily; she was on a sliding scale in hospital and at SNF also  HYPERLIPIDEMIA On no meds LDL was 77 and HDL 71  Rheumatoid arthritis On prednisone 5 mg daily  CAD (coronary artery disease) Bblocker, ASA 81 mg daily, attempting BP control with labile BP  Gout Continue allopurinol    Margit Hanks, MD

## 2014-01-25 NOTE — Assessment & Plan Note (Signed)
Scans and EEG neg ;no further w/u was deemed necessary

## 2014-01-25 NOTE — Assessment & Plan Note (Signed)
On no meds LDL was 77 and HDL 71

## 2014-01-25 NOTE — Assessment & Plan Note (Signed)
Continue allopurinol 

## 2014-01-25 NOTE — Assessment & Plan Note (Signed)
On prednisone 5 mg daily 

## 2014-01-25 NOTE — Assessment & Plan Note (Signed)
Pt was at first hypotensive in hospital, then HTN and BP meds added back. Pt was admitted to SNF then that evening was in ED with BP elevated again. BP meds are being tweeked to gain control

## 2014-01-25 NOTE — Assessment & Plan Note (Signed)
#  1 syncope  Questionable etiology. Orthostatics were not checked on admission and patient was on a multitude of antihypertensive medications which were held on admission. CT of the head is negative. MRI of the head is negative. Chest x-ray is negative for any acute infection. EEG is negative. Preliminary carotid Dopplers with no significant stenosis. 2-D echo with no valvular abnormalities. Patient was placed on gentle hydration on admission. She did complain of a spinning sensation and dizziness and a such PT vestibular evaluation was obtained which was negative for vertigo. Patient was also placed on meclizine as needed. Patient improved clinically and her blood pressure medications adjusted. Once patient's blood pressure started to rebound she was started on Toprol and hydralazine. Hydralazine doses were adjusted and Toprol subsequently changed to labetalol 200 mg twice daily. [name] blood pressure became borderline and patient became symptomatic with dizziness and a such labetalol hydralazine doses were decreased in half and patient currently on hydralazine 25 mg 3 times daily as well as labetalol 100 mg twice daily. [name] blood pressure improved patient improved symptomatically and patient was discharged to a skilled nursing facility in stable and improved condition.

## 2014-01-25 NOTE — Assessment & Plan Note (Addendum)
Hba1c was 7.1 on home glucophage 500 mg daily; she was on a sliding scale in hospital and at SNF also

## 2014-01-25 NOTE — Assessment & Plan Note (Signed)
Bblocker, ASA 81 mg daily, attempting BP control with labile BP

## 2014-01-25 NOTE — Assessment & Plan Note (Signed)
H/o prior CVA with chronic RLE weakness that did not change;w/u neg; ASA for prophylaxis

## 2014-01-26 ENCOUNTER — Non-Acute Institutional Stay (SKILLED_NURSING_FACILITY): Payer: Medicare Other | Admitting: Adult Health

## 2014-01-26 DIAGNOSIS — I10 Essential (primary) hypertension: Secondary | ICD-10-CM

## 2014-01-30 ENCOUNTER — Encounter: Payer: Self-pay | Admitting: Adult Health

## 2014-01-30 NOTE — Progress Notes (Signed)
Patient ID: [name] [name], female   DOB: Mar 20, 1930, 80 y.o.   MRN: [id number]               PROGRESS NOTE  DATE: 01/26/14  FACILITY: Nursing Home Location: [facility]  LEVEL OF CARE: SNF (31)  Acute Visit  CHIEF COMPLAINT:  Manage Hypertension  HISTORY OF PRESENT ILLNESS: This is an 80 year old female who was noted to have elevated BPs: 175/98, 150/80, 142/78, 176/90 and HR 102, 90, 103. No complains of headache but has slight heavy pressure on her neck. Patient is alert and oriented. No seizure noted.   PAST MEDICAL HISTORY : Reviewed.  No changes.  CURRENT MEDICATIONS: Reviewed per [facility]  REVIEW OF SYSTEMS:  GENERAL: no change in appetite, no fatigue, no weight changes, no fever or chills RESPIRATORY: no cough, SOB, DOE, wheezing, hemoptysis CARDIAC: no chest pain, edema or palpitations GI: no abdominal pain, diarrhea, constipation, heart burn, nausea or vomiting  PHYSICAL EXAMINATION  GENERAL: no acute distress, obese NECK: supple, trachea midline, no neck masses, no thyroid tenderness, no thyromegaly LYMPHATICS: no LAN in the neck, no supraclavicular LAN RESPIRATORY: breathing is even & unlabored, BS CTAB CARDIAC: RRR, no murmur,no extra heart sounds, no edema GI: abdomen soft, normal BS, no masses, no tenderness, no hepatomegaly, no splenomegaly EXTREMITIES: RLE weakness PSYCHIATRIC: the patient is alert & oriented to person, affect & behavior appropriate  LABS/RADIOLOGY: Labs reviewed: Basic Metabolic Panel:  Recent Labs  35/70/17 0452 01/22/14 0528 01/22/14 2325  NA 135* 137 134*  K 4.0 4.3 4.3  CL 96 101 96  CO2 22 24 22   GLUCOSE 148* 124* 111*  BUN 19 19 22   CREATININE 0.75 0.75 0.73  CALCIUM 9.7 9.7 9.6   Liver Function Tests:  Recent Labs  07/28/13 1356  12/23/13 0923 01/12/14 1148 01/22/14 2325  AST 15  < > 21 16 16   ALT 13  < > 12 12 11   ALKPHOS 58  < > 70 85 71  BILITOT 0.2*  < > 0.4 0.4 0.2*  PROT 7.4  < > 7.6 7.4  7.3  ALBUMIN 3.4*  --  3.1*  --  3.0*  < > = values in this interval not displayed.   CBC:  Recent Labs  01/12/14 1148 01/17/14 1020 01/17/14 1307 01/22/14 2325  WBC 10.9* 11.1* 10.3 9.9  NEUTROABS 6.8 7.4  --  5.2  HGB 11.3 10.6* 10.6* 10.5*  HCT 33.3* 31.5* 31.4* 30.6*  MCV 83 86.1 85.8 85.5  PLT  --  329 321 380    Lipid Panel:  Recent Labs  01/12/14 1148 01/18/14 0043  HDL 81 71   Cardiac Enzymes:  Recent Labs  01/17/14 1307 01/17/14 2034 01/18/14 0043  TROPONINI <0.30 <0.30 <0.30    CBG:  Recent Labs  01/22/14 0633 01/22/14 1128 01/22/14 1634  GLUCAP 146* 153* 136*     ASSESSMENT/PLAN:  Hypertension - Increase Labetalol 200 mg 1 tab PO BID; BP and HR Q shift x 1 week; BMP next lab draw   CPT CODE: 03/20/14  01/24/14 - NP [facility] [phone]

## 2014-01-30 NOTE — Progress Notes (Signed)
Patient ID: [name] [name], female   DOB: 15-Jun-1930, 80 y.o.   MRN: [id number]               PROGRESS NOTE  DATE: 01/23/2014  FACILITY: Nursing Home Location: [facility]  LEVEL OF CARE: SNF (31)  Acute Visit  CHIEF COMPLAINT:  Follow-up hospitalization  HISTORY OF PRESENT ILLNESS: This is an 80 year old female who has been admitted to [name] on 01/22/14 from [facility] with Syncope and Collapse. She has been admitted for a short-term rehabilitation.  REASSESSMENT OF ONGOING PROBLEM(S):  HTN: Pt 's HTN remains unstable.  Denies CP, sob, DOE, pedal edema, or visual disturbances.  No complications from the medications currently being used. BPs 194/98, 178/104, 194/98  DM:pt's DM remains stable.  Pt denies polyuria, polydipsia, polyphagia, changes in vision or hypoglycemic episodes.  No complications noted from the medication presently being used.   3/15 hemoglobin A1c is:7.1  GOUT: Patien'st  gout remains stable. Patient denies joint pan, redness, swelling or warmth. No complications reported from the medications presently being used.  PAST MEDICAL HISTORY : Reviewed.  No changes.  CURRENT MEDICATIONS: Reviewed per [facility]  REVIEW OF SYSTEMS:  GENERAL: no change in appetite, no fatigue, no weight changes, no fever or chills RESPIRATORY: no cough, SOB, DOE, wheezing, hemoptysis CARDIAC: no chest pain, edema or palpitations GI: no abdominal pain, diarrhea, constipation, heart burn, nausea or vomiting  PHYSICAL EXAMINATION  GENERAL: no acute distress, obese EYES: conjunctivae normal, sclerae normal, normal eye lids NECK: supple, trachea midline, no neck masses, no thyroid tenderness, no thyromegaly LYMPHATICS: no LAN in the neck, no supraclavicular LAN RESPIRATORY: breathing is even & unlabored, BS CTAB CARDIAC: RRR, no murmur,no extra heart sounds, no edema GI: abdomen soft, normal BS, no masses, no tenderness, no hepatomegaly, no  splenomegaly EXTREMITIES: RLE weakness PSYCHIATRIC: the patient is alert & oriented to person, affect & behavior appropriate  LABS/RADIOLOGY: Labs reviewed: Basic Metabolic Panel:  Recent Labs  89/38/10 0452 01/22/14 0528 01/22/14 2325  NA 135* 137 134*  K 4.0 4.3 4.3  CL 96 101 96  CO2 22 24 22   GLUCOSE 148* 124* 111*  BUN 19 19 22   CREATININE 0.75 0.75 0.73  CALCIUM 9.7 9.7 9.6   Liver Function Tests:  Recent Labs  07/28/13 1356  12/23/13 0923 01/12/14 1148 01/22/14 2325  AST 15  < > 21 16 16   ALT 13  < > 12 12 11   ALKPHOS 58  < > 70 85 71  BILITOT 0.2*  < > 0.4 0.4 0.2*  PROT 7.4  < > 7.6 7.4 7.3  ALBUMIN 3.4*  --  3.1*  --  3.0*  < > = values in this interval not displayed.   CBC:  Recent Labs  01/12/14 1148 01/17/14 1020 01/17/14 1307 01/22/14 2325  WBC 10.9* 11.1* 10.3 9.9  NEUTROABS 6.8 7.4  --  5.2  HGB 11.3 10.6* 10.6* 10.5*  HCT 33.3* 31.5* 31.4* 30.6*  MCV 83 86.1 85.8 85.5  PLT  --  329 321 380    Lipid Panel:  Recent Labs  01/12/14 1148 01/18/14 0043  HDL 81 71   Cardiac Enzymes:  Recent Labs  01/17/14 1307 01/17/14 2034 01/18/14 0043  TROPONINI <0.30 <0.30 <0.30    CBG:  Recent Labs  01/22/14 0633 01/22/14 1128 01/22/14 1634  GLUCAP 146* 153* 136*     ASSESSMENT/PLAN:  Syncope and collapse - for rehabilitation History of stroke - has RLE  weakness; for rehabilitation Diabetes Mellitus, type 2 - continue Humalog and Metformin Gout - continue Colchicine and Allopurinol Hypertension - start Clonidine 0.1 mg 1 tab PO Q 6 hours PRN for SBP >160; give Clonidine 0.2 mg PO x 1 now; increase Hydralazine to 25 mg PO QID Rheumatoid Arthritis - continue Prednisone CAD - stable; continue ASA   CPT CODE: 65784  Ella Bodo - NP [facility] [phone]

## 2014-02-02 ENCOUNTER — Encounter: Payer: Self-pay | Admitting: Adult Health

## 2014-02-03 ENCOUNTER — Telehealth: Payer: Self-pay | Admitting: Internal Medicine

## 2014-02-03 NOTE — Telephone Encounter (Signed)
[name] called to inform that patient is feeling weak. Patient was at [facility] and was discharged home yesterday on 02/02/14. Her bp has been in 110/50-60. Reviewed medication list with daughter. She is on clonidine 0.1 mg q6h prn but has been taking it every 6 hour rund the clock instead. Asked her to hold clonidine for now and give only if SBP > 160. Also to hold her amlodipine 5 mg daily for now. Daughter voices understanding this and checking bp until she brings pt to the office for follow up. Family advised to call as needed

## 2014-02-06 ENCOUNTER — Ambulatory Visit (INDEPENDENT_AMBULATORY_CARE_PROVIDER_SITE_OTHER): Payer: Medicare Other | Admitting: Internal Medicine

## 2014-02-06 ENCOUNTER — Encounter: Payer: Self-pay | Admitting: Internal Medicine

## 2014-02-06 VITALS — BP 128/72 | HR 84 | Resp 10 | Wt 198.0 lb

## 2014-02-06 DIAGNOSIS — R6889 Other general symptoms and signs: Secondary | ICD-10-CM

## 2014-02-06 DIAGNOSIS — E119 Type 2 diabetes mellitus without complications: Secondary | ICD-10-CM

## 2014-02-06 DIAGNOSIS — M109 Gout, unspecified: Secondary | ICD-10-CM

## 2014-02-06 DIAGNOSIS — R42 Dizziness and giddiness: Secondary | ICD-10-CM

## 2014-02-06 DIAGNOSIS — I2510 Atherosclerotic heart disease of native coronary artery without angina pectoris: Secondary | ICD-10-CM

## 2014-02-06 DIAGNOSIS — I998 Other disorder of circulatory system: Secondary | ICD-10-CM | POA: Insufficient documentation

## 2014-02-06 DIAGNOSIS — M069 Rheumatoid arthritis, unspecified: Secondary | ICD-10-CM

## 2014-02-06 MED ORDER — CLONIDINE HCL 0.1 MG PO TABS: 0.1000 mg | ORAL_TABLET | Freq: Two times a day (BID) | ORAL | Status: DC | PRN

## 2014-02-06 MED ORDER — HYDRALAZINE HCL 25 MG PO TABS: 25.0000 mg | ORAL_TABLET | Freq: Three times a day (TID) | ORAL | Status: DC

## 2014-02-06 NOTE — Patient Instructions (Addendum)
Stop taking amlodipine  Change hydralazine to 25 mg to three times a day only (instead of 4 times a day)  Take clonidine only for upper bp reading > 160 and lower bp reading > 100  Check blood pressure in between times like 11 am, 4 pm.   Take meclizine only when needed for dizziness  Stop taking naproxen

## 2014-02-06 NOTE — Progress Notes (Signed)
Patient ID: [name] [name], female   DOB: 1929-12-20, 80 y.o.   MRN: [id number]    Chief Complaint  Patient presents with  . Hospitalization Follow-up    in [place] for STR, HTN   Allergies  Allergen Reactions  . Cetirizine Hcl Other (See Comments)    unknown  . Codeine Other (See Comments)    unknown  . Fish Allergy   . Indomethacin Other (See Comments)    unknown  . Lyrica [Pregabalin]     hallucinations  . Methyldopa Other (See Comments)    unknown  . Shellfish Allergy Hives  . Tomato Rash   HPI 80 y/o female patient is here for follow up visit after being in hospital with orthostatic hypotension and then in SNF for STR. She has been discharged home with home health services. She is here with her 2 daughters today and plan is for her to move to [facility] for few weeks with her daughter. She has been complaint with her medications. Her blood pressure has been fluctuating.176/71, 136/81, 100/54, 111/50, 159/75 and she has been taking clonidine and meclizine on standing basis instead of as needed.  She denies any dizziness at present cbg yesterday afternoon was 120 and cbg in faciliy had been between 90-150 as per daughters Her pain is currently somewhat controlled She has hx of RA and is on chronic prednisone for now Feels tired but mentions her energy level is slowly imporving  Review of Systems  Constitutional: Negative for fever, chills, weight loss, diaphoresis.  HENT: Negative for congestion, hearing loss and sore throat.   Eyes: Negative for blurred vision, double vision and discharge.  Respiratory: Negative for cough, sputum production, shortness of breath and wheezing.   Cardiovascular: Negative for chest pain, palpitations, orthopnea. Has leg swelling.  Gastrointestinal: Negative for heartburn, nausea, vomiting, abdominal pain, diarrhea and constipation.  Genitourinary: Negative for dysuria, urgency, frequency and flank pain.  Musculoskeletal:has joint pain  and low back pain. No falls reported Skin: Negative for itching and rash.  Neurological: Negative for dizziness, tingling, focal weakness and headaches.  Psychiatric/Behavioral: Negative for depression and memory loss. The patient is not nervous/anxious.    Past Medical History  Diagnosis Date  . Hypertension   . First degree atrioventricular block   . Onychia and paronychia of toe   . Muscle weakness (generalized)   . Allergic rhinitis due to pollen 04/27/2007  . Gout, unspecified 04/19/2013  . Disorder of bone and cartilage, unspecified   . Edema 05/03/2013  . Hyperlipidemia 04/27/2007  . Unspecified essential hypertension 08/08/2013  . Coronary atherosclerosis of native coronary artery   . Other malaise and fatigue   . Cough   . Other fall   . Unspecified vitamin D deficiency   . TIA (transient ischemic attack)     "a few one summer" (08/07/2013)  . Myocardial infarction ~ 1970  . Exertional shortness of breath     "sometimes" (08/07/2013)  . Type II diabetes mellitus     "fasting 90-110s" (08/07/2013)  . Osteoarthrosis, unspecified whether generalized or localized, unspecified site 04/27/2007  . Prepatellar bursitis   . Arthritis     "in q joint" (08/07/2013)  . Stroke 01/17/2014  . Seizures    Past Surgical History  Procedure Laterality Date  . Tonsillectomy  08/1974  . Replacement total knee Right 09/2005  . Shoulder open rotator cuff repair Right 07/1999  . Appendectomy  1960  . Abdominal hysterectomy  04/1980  . Transthoracic echocardiogram  2003  EF 55-65%; mild concentric LVH  . Joint replacement    . Total knee arthroplasty Left 08/07/2013  . Cataract extraction w/ intraocular lens  implant, bilateral Bilateral ~ 2012  . Total knee arthroplasty Left 08/07/2013    Procedure: TOTAL KNEE ARTHROPLASTY- LEFT;  Surgeon: Dannielle Huh, MD;  Location: [facility] OR;  Service: Orthopedics;  Laterality: Left;  . Cardiac catheterization  2003   Current Outpatient Prescriptions on  File Prior to Visit  Medication Sig Dispense Refill  . albuterol (PROVENTIL HFA;VENTOLIN HFA) 108 (90 BASE) MCG/ACT inhaler Inhale 2 puffs into the lungs every 6 (six) hours as needed for wheezing.  1 Inhaler  0  . allopurinol (ZYLOPRIM) 100 MG tablet Take 100 mg by mouth daily. For gout      . aspirin 81 MG tablet Take 81 mg by mouth daily.      . calcium-vitamin D (OSCAL WITH D) 500-200 MG-UNIT per tablet Take 1 tablet by mouth 2 (two) times daily.      . cholecalciferol (VITAMIN D) 1000 UNITS tablet Take 1,000 Units by mouth every other day.      . colchicine 0.6 MG tablet Take 0.6 mg by mouth daily. For gout      . diclofenac sodium (VOLTAREN) 1 % GEL Apply 2 g topically 2 (two) times daily. For pain      . labetalol (NORMODYNE) 100 MG tablet Take 200 mg by mouth 2 (two) times daily.      . meclizine (ANTIVERT) 12.5 MG tablet Take 1 tablet (12.5 mg total) by mouth 3 (three) times daily as needed for dizziness.  30 tablet  0  . metFORMIN (GLUCOPHAGE) 500 MG tablet Take 500 mg by mouth daily with breakfast. For diabetes      . polyvinyl alcohol (LIQUIFILM TEARS) 1.4 % ophthalmic solution Place 1 drop into both eyes as needed (for dry eyes).      . predniSONE (DELTASONE) 5 MG tablet Take 1 tablet (5 mg total) by mouth daily before breakfast.  31 tablet  0  . traMADol (ULTRAM) 50 MG tablet Take 1-2 tablets (50-100 mg total) by mouth every 6 (six) hours as needed for moderate pain.  20 tablet  0  . triamcinolone cream (KENALOG) 0.1 % Apply 1 application topically 2 (two) times daily. For affected area       No current facility-administered medications on file prior to visit.    Physical exam BP 128/72  Pulse 84  Resp 10  Wt 198 lb (89.812 kg)  SpO2 98%  General- elderly female in no acute distress, obese Head- atraumatic, normocephalic Eyes- PERRLA, EOMI, no pallor, no icterus, no discharge Neck- no lymphadenopathy, no thyromegaly Cardiovascular- normal s1,s2, no murmurs/ rubs/ gallops,  normal distal pulses Respiratory- bilateral clear to auscultation, no wheeze, no rhonchi, no crackles Abdomen- bowel sounds present, soft, non tender Musculoskeletal- able to move all 4 extremities, leg edema present. Limited ROM, on wheelchair Neurological- no focal deficit Skin- warm and dry Psychiatry- alert and oriented to person, place and time, normal mood and affect  Labs-  Lab Results  Component Value Date   HGBA1C 7.1* 01/18/2014    CMP     Component Value Date/Time   NA 134* 01/22/2014 2325   NA 137 01/12/2014 1148   K 4.3 01/22/2014 2325   CL 96 01/22/2014 2325   CO2 22 01/22/2014 2325   GLUCOSE 111* 01/22/2014 2325   GLUCOSE 124* 01/12/2014 1148   BUN 22 01/22/2014 2325   BUN 16  01/12/2014 1148   CREATININE 0.73 01/22/2014 2325   CALCIUM 9.6 01/22/2014 2325   PROT 7.3 01/22/2014 2325   PROT 7.4 01/12/2014 1148   ALBUMIN 3.0* 01/22/2014 2325   AST 16 01/22/2014 2325   ALT 11 01/22/2014 2325   ALKPHOS 71 01/22/2014 2325   BILITOT 0.2* 01/22/2014 2325   GFRNONAA 78* 01/22/2014 2325   GFRAA >90 01/22/2014 2325    Assessment/plan  1. Fluctuating blood pressure Likely iatrogenic with patient taking clonnidine on standing basis and then once bp drops, stopping to take other medications leading to rebound hypertension. Will d/c amlodipine for now. Change hydralazine to 25 mg tid instead of qid for now. Will write for clonidine q12h prn for SBP > 160 and DBP> 100 only. Also advised to check bp after the medication in the morning for now. Keep bp reading list and to review on next OV  2. DIABETES MELLITUS, TYPE II Reviewed recent a1c of 7.1. Will stop SSI and continue metformin 500 mg only daily for now  3. Gout No recent gout attack. Continue colchicine and allopurinol  4. Rheumatoid arthritis On prednsione 5 mg daily. Pt and family advised to keep follow up with rheumatology for starting methotrexate.   5. Dizziness Resolved at present. To work with PT services at home and advised  to take meclizine only as needed for severe dizziness. Daughter advised to not put this in pill box and keep it separately for now and give to patient only when symptomatic  Spent more than 50 minutes with patient and spent > 50% of the visit counselling on patient care

## 2014-02-08 ENCOUNTER — Telehealth: Payer: Self-pay | Admitting: *Deleted

## 2014-02-08 NOTE — Telephone Encounter (Signed)
Patient daughter called and stated that patient's BP is running low and staying low. 92/50. Informed her to take her to the urgent care or ER to be evaluated. Daughter agreed.

## 2014-02-16 DIAGNOSIS — I10 Essential (primary) hypertension: Secondary | ICD-10-CM

## 2014-02-16 DIAGNOSIS — E119 Type 2 diabetes mellitus without complications: Secondary | ICD-10-CM

## 2014-02-16 DIAGNOSIS — M069 Rheumatoid arthritis, unspecified: Secondary | ICD-10-CM

## 2014-02-16 DIAGNOSIS — R269 Unspecified abnormalities of gait and mobility: Secondary | ICD-10-CM

## 2014-02-19 ENCOUNTER — Telehealth: Payer: Self-pay | Admitting: *Deleted

## 2014-02-19 NOTE — Telephone Encounter (Signed)
[name] [name] with [name] called and left voice message and stated that patient's BP is running high at 164/102. Tried calling nurse back and LM for her to return call.\ Spoke with nurse and she stated that patient is in [place] right now with daughter. Patient was in hospital in [place] and then went to rehab. Informed nurse that patient needs to go to Urgent Care to be evaluated. Nurse agreed.

## 2014-02-21 ENCOUNTER — Telehealth: Payer: Self-pay | Admitting: *Deleted

## 2014-02-21 NOTE — Telephone Encounter (Signed)
[name] [name] with [name] called needs a verbal order for PT. Given.

## 2014-03-06 ENCOUNTER — Ambulatory Visit (INDEPENDENT_AMBULATORY_CARE_PROVIDER_SITE_OTHER): Payer: Medicare Other | Admitting: Internal Medicine

## 2014-03-06 ENCOUNTER — Encounter: Payer: Self-pay | Admitting: Internal Medicine

## 2014-03-06 ENCOUNTER — Ambulatory Visit: Payer: Medicare Other | Admitting: Internal Medicine

## 2014-03-06 VITALS — BP 150/86 | HR 102 | Temp 98.0°F | Resp 10 | Wt 196.0 lb

## 2014-03-06 DIAGNOSIS — I10 Essential (primary) hypertension: Secondary | ICD-10-CM

## 2014-03-06 DIAGNOSIS — I2510 Atherosclerotic heart disease of native coronary artery without angina pectoris: Secondary | ICD-10-CM

## 2014-03-06 DIAGNOSIS — E119 Type 2 diabetes mellitus without complications: Secondary | ICD-10-CM

## 2014-03-06 DIAGNOSIS — M069 Rheumatoid arthritis, unspecified: Secondary | ICD-10-CM

## 2014-03-06 MED ORDER — TRAMADOL HCL 50 MG PO TABS: 50.0000 mg | ORAL_TABLET | Freq: Four times a day (QID) | ORAL | Status: DC | PRN

## 2014-03-06 NOTE — Progress Notes (Signed)
Patient ID: [name] [name], female   DOB: 1930-05-26, 80 y.o.   MRN: [id number]    Chief Complaint  Patient presents with  . Hospitalization Follow-up    1 month follow-up   . Medication Management    clarification on med timing   Allergies  Allergen Reactions  . Cetirizine Hcl Other (See Comments)    unknown  . Codeine Other (See Comments)    unknown  . Fish Allergy   . Indomethacin Other (See Comments)    unknown  . Lyrica [Pregabalin]     hallucinations  . Methyldopa Other (See Comments)    unknown  . Shellfish Allergy Hives  . Tomato Rash   HPI 80 y/o female patient is here for follow up visit. Her bp readings have improved. Her pain in her joints is better controlled. She has been complaint with her medications. Daughter feels her medication being taken all in the morning (the once a day) is upsetting her stomach and bp readings. She would like to spread them out. She denies any dizziness at present Has not seen rheumatology services yet bp reading at home 132-160/ 60-80  Review of Systems  Constitutional: Negative for fever, chills, weight loss, diaphoresis.  HENT: Negative for congestion, hearing loss and sore throat.   Eyes: Negative for blurred vision, double vision and discharge.  Respiratory: Negative for cough, sputum production, shortness of breath and wheezing.   Cardiovascular: Negative for chest pain, palpitations, orthopnea. Has leg swelling.  Gastrointestinal: Negative for heartburn, nausea, vomiting, abdominal pain, diarrhea and constipation.  Genitourinary: Negative for dysuria, urgency, frequency and flank pain.  Musculoskeletal:has joint pain and low back pain. No falls reported Skin: Negative for itching and rash.  Neurological: Negative for dizziness, tingling, focal weakness and headaches.  Psychiatric/Behavioral: Negative for depression and memory loss. The patient is not nervous/anxious.   Past Medical History  Diagnosis Date  . Hypertension     . First degree atrioventricular block   . Onychia and paronychia of toe   . Muscle weakness (generalized)   . Allergic rhinitis due to pollen 04/27/2007  . Gout, unspecified 04/19/2013  . Disorder of bone and cartilage, unspecified   . Edema 05/03/2013  . Hyperlipidemia 04/27/2007  . Unspecified essential hypertension 08/08/2013  . Coronary atherosclerosis of native coronary artery   . Other malaise and fatigue   . Cough   . Other fall   . Unspecified vitamin D deficiency   . TIA (transient ischemic attack)     "a few one summer" (08/07/2013)  . Myocardial infarction ~ 1970  . Exertional shortness of breath     "sometimes" (08/07/2013)  . Type II diabetes mellitus     "fasting 90-110s" (08/07/2013)  . Osteoarthrosis, unspecified whether generalized or localized, unspecified site 04/27/2007  . Prepatellar bursitis   . Arthritis     "in q joint" (08/07/2013)  . Stroke 01/17/2014  . Seizures    Current Outpatient Prescriptions on File Prior to Visit  Medication Sig Dispense Refill  . albuterol (PROVENTIL HFA;VENTOLIN HFA) 108 (90 BASE) MCG/ACT inhaler Inhale 2 puffs into the lungs every 6 (six) hours as needed for wheezing.  1 Inhaler  0  . allopurinol (ZYLOPRIM) 100 MG tablet Take 100 mg by mouth daily. For gout      . aspirin 81 MG tablet Take 81 mg by mouth daily.      . calcium-vitamin D (OSCAL WITH D) 500-200 MG-UNIT per tablet Take 1 tablet by mouth 2 (two) times daily.      Marland Kitchen  cholecalciferol (VITAMIN D) 1000 UNITS tablet Take 1,000 Units by mouth every other day.      . cloNIDine (CATAPRES) 0.1 MG tablet Take 1 tablet (0.1 mg total) by mouth every 12 (twelve) hours as needed. For SBP>160  60 tablet  0  . colchicine 0.6 MG tablet Take 0.6 mg by mouth daily. For gout      . diclofenac sodium (VOLTAREN) 1 % GEL Apply 2 g topically 2 (two) times daily. For pain      . hydrALAZINE (APRESOLINE) 25 MG tablet Take 1 tablet (25 mg total) by mouth 3 (three) times daily. For HTN  90  tablet  0  . labetalol (NORMODYNE) 100 MG tablet Take 200 mg by mouth 2 (two) times daily.      . meclizine (ANTIVERT) 12.5 MG tablet Take 1 tablet (12.5 mg total) by mouth 3 (three) times daily as needed for dizziness.  30 tablet  0  . metFORMIN (GLUCOPHAGE) 500 MG tablet Take 500 mg by mouth daily with breakfast. For diabetes      . polyvinyl alcohol (LIQUIFILM TEARS) 1.4 % ophthalmic solution Place 1 drop into both eyes as needed (for dry eyes).      . predniSONE (DELTASONE) 5 MG tablet Take 1 tablet (5 mg total) by mouth daily before breakfast.  31 tablet  0  . triamcinolone cream (KENALOG) 0.1 % Apply 1 application topically 2 (two) times daily. For affected area       No current facility-administered medications on file prior to visit.   Past Surgical History  Procedure Laterality Date  . Tonsillectomy  08/1974  . Replacement total knee Right 09/2005  . Shoulder open rotator cuff repair Right 07/1999  . Appendectomy  1960  . Abdominal hysterectomy  04/1980  . Transthoracic echocardiogram  2003    EF 55-65%; mild concentric LVH  . Joint replacement    . Total knee arthroplasty Left 08/07/2013  . Cataract extraction w/ intraocular lens  implant, bilateral Bilateral ~ 2012  . Total knee arthroplasty Left 08/07/2013    Procedure: TOTAL KNEE ARTHROPLASTY- LEFT;  Surgeon: Dannielle Huh, MD;  Location: [facility] OR;  Service: Orthopedics;  Laterality: Left;  . Cardiac catheterization  2003    Physical exam BP 150/86  Pulse 102  Temp(Src) 98 F (36.7 C) (Oral)  Resp 10  Wt 196 lb (88.905 kg)  SpO2 95%  General- elderly female in no acute distress, obese Head- atraumatic, normocephalic Eyes- PERRLA, EOMI, no pallor, no icterus, no discharge Neck- no lymphadenopathy, no thyromegaly Cardiovascular- normal s1,s2, no murmurs/ rubs/ gallops, normal distal pulses Respiratory- bilateral clear to auscultation, no wheeze, no rhonchi, no crackles Abdomen- bowel sounds present, soft, non  tender Musculoskeletal- able to move all 4 extremities, leg edema present. Limited ROM, on wheelchair Neurological- no focal deficit Skin- warm and dry Psychiatry- alert and oriented to person, place and time, normal mood and affect  Assessment/plan  1. HTN (hypertension) imporved bp readings.no fluctuating bp readings noted. Continue conidine 0.1 mg with hydralazine 25 mg tid and labetalol 100 mg bid  2.DIABETES MELLITUS, TYPE II continue metformin 500 mg only daily for now  3. Rheumatoid arthritis Continue prednsione 5 mg daily with tramadol and advised on keeping follow up with rheumatology for initiating DMARDs

## 2014-03-27 ENCOUNTER — Telehealth: Payer: Self-pay | Admitting: *Deleted

## 2014-03-27 NOTE — Telephone Encounter (Signed)
Joselyn Glassman with [name] Notified

## 2014-03-27 NOTE — Telephone Encounter (Signed)
Thanks- lets keep a log of her bp reading and assess for need for med adjustment. Check bp once a day

## 2014-03-27 NOTE — Telephone Encounter (Signed)
Tyler with [name] called and stated that her BP was running high this morning 159/100 after taking a Clonidine. York Spaniel it was coming down but just wanted to left you know. Patient is Non Symptomatic and not in distress.

## 2014-03-28 ENCOUNTER — Encounter: Payer: Self-pay | Admitting: Internal Medicine

## 2014-04-04 ENCOUNTER — Other Ambulatory Visit: Payer: Self-pay | Admitting: *Deleted

## 2014-04-04 MED ORDER — LABETALOL HCL 100 MG PO TABS: 200.0000 mg | ORAL_TABLET | Freq: Two times a day (BID) | ORAL | Status: DC

## 2014-04-04 NOTE — Telephone Encounter (Signed)
Patient daughter called and requested Rx faxed to [facility] in [place]. Patient is visiting for the week and only has 1 more day of medication left. Faxed

## 2014-04-05 ENCOUNTER — Telehealth: Payer: Self-pay | Admitting: *Deleted

## 2014-04-05 NOTE — Telephone Encounter (Signed)
Inetta Fermo with [facility] called and stated that patient will be coming home from rehab in about 2 weeks and needs Insurance forms filled out so insurance will approve Homecare to come into home. Explained to her that Dr. Glade Lloyd would not be in the office until Tuesday and this was fine.

## 2014-04-16 ENCOUNTER — Other Ambulatory Visit: Payer: Self-pay | Admitting: Internal Medicine

## 2014-04-19 ENCOUNTER — Other Ambulatory Visit: Payer: Self-pay | Admitting: Internal Medicine

## 2014-04-26 ENCOUNTER — Other Ambulatory Visit: Payer: Self-pay | Admitting: Internal Medicine

## 2014-05-01 ENCOUNTER — Encounter: Payer: Self-pay | Admitting: Internal Medicine

## 2014-05-01 ENCOUNTER — Ambulatory Visit (INDEPENDENT_AMBULATORY_CARE_PROVIDER_SITE_OTHER): Payer: Medicare Other | Admitting: Internal Medicine

## 2014-05-01 VITALS — BP 142/80 | HR 107 | Temp 98.0°F | Wt 189.0 lb

## 2014-05-01 DIAGNOSIS — I10 Essential (primary) hypertension: Secondary | ICD-10-CM

## 2014-05-01 DIAGNOSIS — IMO0001 Reserved for inherently not codable concepts without codable children: Secondary | ICD-10-CM

## 2014-05-01 DIAGNOSIS — M797 Fibromyalgia: Secondary | ICD-10-CM

## 2014-05-01 DIAGNOSIS — I2510 Atherosclerotic heart disease of native coronary artery without angina pectoris: Secondary | ICD-10-CM

## 2014-05-01 DIAGNOSIS — M069 Rheumatoid arthritis, unspecified: Secondary | ICD-10-CM

## 2014-05-01 DIAGNOSIS — M1990 Unspecified osteoarthritis, unspecified site: Secondary | ICD-10-CM

## 2014-05-01 DIAGNOSIS — D638 Anemia in other chronic diseases classified elsewhere: Secondary | ICD-10-CM | POA: Insufficient documentation

## 2014-05-01 DIAGNOSIS — E119 Type 2 diabetes mellitus without complications: Secondary | ICD-10-CM

## 2014-05-01 DIAGNOSIS — Z5181 Encounter for therapeutic drug level monitoring: Secondary | ICD-10-CM

## 2014-05-01 DIAGNOSIS — R000 Tachycardia, unspecified: Secondary | ICD-10-CM

## 2014-05-01 MED ORDER — GLUCOSE BLOOD VI STRP: ORAL_STRIP | Status: DC

## 2014-05-01 NOTE — Progress Notes (Signed)
Patient ID: [name] [name], female   DOB: 1930/09/14, 80 y.o.   MRN: [id number]    Chief Complaint  Patient presents with  . Medical Management of Chronic Issues    bp, dm, joint aches   Allergies  Allergen Reactions  . Cetirizine Hcl Other (See Comments)    unknown  . Codeine Other (See Comments)    unknown  . Fish Allergy   . Indomethacin Other (See Comments)    unknown  . Lyrica [Pregabalin]     hallucinations  . Methyldopa Other (See Comments)    unknown  . Shellfish Allergy Hives  . Tomato Rash   HPI 80 y/o female patient is here for follow up with her daughter. Her bp readings elevated in office but at home mostly less tan 150 with few reading 1-2 a week > 160 where she needs clonidine and this brings it to normal range. Her pain in her joints is better controlled. She has been complaint with her medications. She was with her other daughter in [place] for 3 months and has been back for 2 weeks.  Flaxseed, fish oil blend with whole fruit pulp and protein supplement shake once a day and that has been helping with her joint ache. Has not seen her rheumatologist 3 week back she was in bathroom and got lightheaded and passed out. She was straining while in bathroom and EMS was called. She was assessed by EMS and it was thought to be vasovagal syncope. Has lost weight  Review of Systems   Constitutional: Negative for fever, chills, diaphoresis.   HENT: Negative for congestion, hearing loss and sore throat.    Eyes: Negative for blurred vision, double vision and discharge.   Respiratory: Negative for cough, sputum production, shortness of breath and wheezing.    Cardiovascular: Negative for chest pain, palpitations, orthopnea. Has leg swelling.   Gastrointestinal: Negative for heartburn, nausea, vomiting, abdominal pain, diarrhea and constipation.   Genitourinary: Negative for dysuria, urgency, frequency and flank pain.   Musculoskeletal:has joint pain and low back pain better  controlled. No falls reported Skin: Negative for itching and rash.   Neurological: Negative for dizziness, tingling, focal weakness and headaches.   Psychiatric/Behavioral: Negative for depression and memory loss. The patient is not nervous/anxious.   Past Medical History  Diagnosis Date  . Hypertension   . First degree atrioventricular block   . Onychia and paronychia of toe   . Muscle weakness (generalized)   . Allergic rhinitis due to pollen 04/27/2007  . Gout, unspecified 04/19/2013  . Disorder of bone and cartilage, unspecified   . Edema 05/03/2013  . Hyperlipidemia 04/27/2007  . Unspecified essential hypertension 08/08/2013  . Coronary atherosclerosis of native coronary artery   . Other malaise and fatigue   . Cough   . Other fall   . Unspecified vitamin D deficiency   . TIA (transient ischemic attack)     "a few one summer" (08/07/2013)  . Myocardial infarction ~ 1970  . Exertional shortness of breath     "sometimes" (08/07/2013)  . Type II diabetes mellitus     "fasting 90-110s" (08/07/2013)  . Osteoarthrosis, unspecified whether generalized or localized, unspecified site 04/27/2007  . Prepatellar bursitis   . Arthritis     "in q joint" (08/07/2013)  . Stroke 01/17/2014  . Seizures    Past Surgical History  Procedure Laterality Date  . Tonsillectomy  08/1974  . Replacement total knee Right 09/2005  . Shoulder open rotator cuff repair Right  07/1999  . Appendectomy  1960  . Abdominal hysterectomy  04/1980  . Transthoracic echocardiogram  2003    EF 55-65%; mild concentric LVH  . Joint replacement    . Total knee arthroplasty Left 08/07/2013  . Cataract extraction w/ intraocular lens  implant, bilateral Bilateral ~ 2012  . Total knee arthroplasty Left 08/07/2013    Procedure: TOTAL KNEE ARTHROPLASTY- LEFT;  Surgeon: Dannielle Huh, MD;  Location: [facility] OR;  Service: Orthopedics;  Laterality: Left;  . Cardiac catheterization  2003   Current Outpatient Prescriptions on File  Prior to Visit  Medication Sig Dispense Refill  . albuterol (PROVENTIL HFA;VENTOLIN HFA) 108 (90 BASE) MCG/ACT inhaler Inhale 2 puffs into the lungs every 6 (six) hours as needed for wheezing.  1 Inhaler  0  . allopurinol (ZYLOPRIM) 100 MG tablet Take 100 mg by mouth daily. For gout      . allopurinol (ZYLOPRIM) 100 MG tablet TAKE 1 TABLET BY MOUTH EVERY DAY FOR GOUT  30 tablet  2  . aspirin 81 MG tablet Take 81 mg by mouth daily.      . calcium-vitamin D (OSCAL WITH D) 500-200 MG-UNIT per tablet Take 1 tablet by mouth 2 (two) times daily.      . cholecalciferol (VITAMIN D) 1000 UNITS tablet Take 1,000 Units by mouth every other day.      . cloNIDine (CATAPRES) 0.1 MG tablet Take 1 tablet (0.1 mg total) by mouth every 12 (twelve) hours as needed. For SBP>160  60 tablet  0  . diclofenac sodium (VOLTAREN) 1 % GEL Apply 2 g topically 2 (two) times daily. For pain      . hydrALAZINE (APRESOLINE) 25 MG tablet Take 1 tablet (25 mg total) by mouth 3 (three) times daily. For HTN  90 tablet  0  . labetalol (NORMODYNE) 100 MG tablet Take 2 tablets (200 mg total) by mouth 2 (two) times daily.  120 tablet  1  . meclizine (ANTIVERT) 12.5 MG tablet Take 1 tablet (12.5 mg total) by mouth 3 (three) times daily as needed for dizziness.  30 tablet  0  . metFORMIN (GLUCOPHAGE) 500 MG tablet Take 500 mg by mouth daily with breakfast. For diabetes      . polyvinyl alcohol (LIQUIFILM TEARS) 1.4 % ophthalmic solution Place 1 drop into both eyes as needed (for dry eyes).      . predniSONE (DELTASONE) 5 MG tablet Take 1 tablet (5 mg total) by mouth daily before breakfast.  31 tablet  0  . traMADol (ULTRAM) 50 MG tablet Take 1-2 tablets (50-100 mg total) by mouth every 6 (six) hours as needed for moderate pain.  120 tablet  0  . triamcinolone cream (KENALOG) 0.1 % Apply 1 application topically 2 (two) times daily. For affected area      . VOLTAREN 1 % GEL APPLY TOPICALLY 3 TO 4 TIMES DAILY.  100 g  1   No current  facility-administered medications on file prior to visit.    Physical exam BP 142/80  Pulse 107  Temp(Src) 98 F (36.7 C) (Oral)  Wt 189 lb (85.73 kg)  SpO2 97%  General- elderly female in no acute distress, obese Head- atraumatic, normocephalic Eyes- PERRLA, EOMI, no pallor, no icterus, no discharge Neck- no lymphadenopathy, no thyromegaly Cardiovascular- normal s1,s2, no murmurs/ rubs/ gallops, normal distal pulses Respiratory- bilateral clear to auscultation, no wheeze, no rhonchi, no crackles Abdomen- bowel sounds present, soft, non tender Musculoskeletal- able to move all 4 extremities, leg  edema present. Limited ROM, on wheelchair Neurological- no focal deficit Skin- warm and dry Psychiatry- alert and oriented to person, place and time, normal mood and affect  Wt Readings from Last 3 Encounters:  05/01/14 189 lb (85.73 kg)  03/06/14 196 lb (88.905 kg)  02/06/14 198 lb (89.812 kg)    Lab Results  Component Value Date   URICACID 7.5* 01/12/2014   Lab Results  Component Value Date   HGBA1C 7.1* 01/18/2014   Lab Results  Component Value Date   CREATININE 0.73 01/22/2014   Assessment/plan  1. Essential hypertension Repeat bp 142/80. No med changes made. Continue hydralazine 25 mg tid and laetalol 200 mg bid with prn clonidine - Basic Metabolic Panel  2. DIABETES MELLITUS, TYPE II Continue metformin.  - Hemoglobin A1c - Basic Metabolic Panel - Microalbumin/Creatinine Ratio, Urine  3. Fibromyalgia Stable on current prednisone   4. Rheumatoid arthritis Recommended rheumatology f/u for DMARD initiation. Family not willing at present. contnue prednisone for now  5. OSTEOARTHRITIS voltaren gel and tramadol has been helpful. Monitor lft periodically  6. Anemia of chronic disease Recheck cbc - CBC with Differential  7. Tachycardia *rule out thyroid abnormality and anemia. Recent ekg from hospital had tachycardia with normal sinus rhythm. If no reversible cause  identified with her being on labetalol 200 mg bid, will get repeat EKG and possible holter testing - TSH  [id number].V58.83 With her being on multiple medication, genetic testing done to help assist with drug monitoring. Review result once available.

## 2014-05-02 LAB — CBC WITH DIFFERENTIAL/PLATELET
Basophils Absolute: 0 10*3/uL (ref 0.0–0.2)
Basos: 0 %
Eos: 2 %
Eosinophils Absolute: 0.3 10*3/uL (ref 0.0–0.4)
HCT: 37.5 % (ref 34.0–46.6)
Hemoglobin: 12.4 g/dL (ref 11.1–15.9)
Immature Grans (Abs): 0 10*3/uL (ref 0.0–0.1)
Immature Granulocytes: 0 %
Lymphocytes Absolute: 4.1 10*3/uL — ABNORMAL HIGH (ref 0.7–3.1)
Lymphs: 33 %
MCH: 29 pg (ref 26.6–33.0)
MCHC: 33.1 g/dL (ref 31.5–35.7)
MCV: 88 fL (ref 79–97)
Monocytes Absolute: 1 10*3/uL — ABNORMAL HIGH (ref 0.1–0.9)
Monocytes: 8 %
Neutrophils Absolute: 6.8 10*3/uL (ref 1.4–7.0)
Neutrophils Relative %: 57 %
RBC: 4.28 x10E6/uL (ref 3.77–5.28)
RDW: 17.1 % — ABNORMAL HIGH (ref 12.3–15.4)
WBC: 12.2 10*3/uL — ABNORMAL HIGH (ref 3.4–10.8)

## 2014-05-02 LAB — BASIC METABOLIC PANEL
BUN/Creatinine Ratio: 23 (ref 11–26)
BUN: 25 mg/dL (ref 8–27)
CO2: 23 mmol/L (ref 18–29)
Calcium: 10.3 mg/dL (ref 8.7–10.3)
Chloride: 98 mmol/L (ref 97–108)
Creatinine, Ser: 1.09 mg/dL — ABNORMAL HIGH (ref 0.57–1.00)
GFR calc Af Amer: 55 mL/min/{1.73_m2} — ABNORMAL LOW (ref >59)
GFR calc non Af Amer: 47 mL/min/{1.73_m2} — ABNORMAL LOW (ref >59)
Glucose: 91 mg/dL (ref 65–99)
Potassium: 3.7 mmol/L (ref 3.5–5.2)
Sodium: 138 mmol/L (ref 134–144)

## 2014-05-02 LAB — HEMOGLOBIN A1C
Est. average glucose Bld gHb Est-mCnc: 151 mg/dL
Hgb A1c MFr Bld: 6.9 % — ABNORMAL HIGH (ref 4.8–5.6)

## 2014-05-02 LAB — TSH: TSH: 2.51 u[IU]/mL (ref 0.450–4.500)

## 2014-05-04 ENCOUNTER — Encounter: Payer: Self-pay | Admitting: *Deleted

## 2014-05-04 ENCOUNTER — Telehealth: Payer: Self-pay | Admitting: *Deleted

## 2014-05-04 NOTE — Telephone Encounter (Signed)
Message copied by Lamont Snowball on Fri May 04, 2014 11:10 AM ------      Message from: Oneal Grout      Created: Wed May 02, 2014  1:33 PM       Your white count is high and this is likely from you being on steroid. Will monitor this clinically for now. Your sugar has improved and so has your thyroid function. Your kidney function has slightly worsened- likely is with your HTN and DM. Avoid NSAIDs and encourage hydration for now. Stop your colchicine as advised in office. ------

## 2014-05-04 NOTE — Telephone Encounter (Signed)
Spoke with patient regarding labs and she stated that she understood and would follow the directions given at the office visit.

## 2014-05-08 ENCOUNTER — Ambulatory Visit (INDEPENDENT_AMBULATORY_CARE_PROVIDER_SITE_OTHER): Payer: Medicare Other | Admitting: Internal Medicine

## 2014-05-08 ENCOUNTER — Encounter: Payer: Self-pay | Admitting: Internal Medicine

## 2014-05-08 VITALS — BP 182/110 | HR 112 | Temp 98.4°F | Resp 18 | Ht 64.0 in | Wt 194.4 lb

## 2014-05-08 DIAGNOSIS — J0100 Acute maxillary sinusitis, unspecified: Secondary | ICD-10-CM

## 2014-05-08 DIAGNOSIS — I2510 Atherosclerotic heart disease of native coronary artery without angina pectoris: Secondary | ICD-10-CM

## 2014-05-08 MED ORDER — PSEUDOEPHEDRINE-GUAIFENESIN ER 120-1200 MG PO TB12: ORAL_TABLET | ORAL | Status: DC

## 2014-05-08 MED ORDER — NAPROXEN 500 MG PO TABS: 500.0000 mg | ORAL_TABLET | Freq: Two times a day (BID) | ORAL | Status: DC

## 2014-05-08 NOTE — Patient Instructions (Signed)
Use medication as directed. 

## 2014-05-08 NOTE — Progress Notes (Signed)
Patient ID: [name] [name], female   DOB: 1930/08/06, 80 y.o.   MRN: [id number]    Location:    [facility]  Place of Service:  OFFICE    Allergies  Allergen Reactions  . Cetirizine Hcl Other (See Comments)    unknown  . Codeine Other (See Comments)    unknown  . Fish Allergy   . Indomethacin Other (See Comments)    unknown  . Lyrica [Pregabalin]     hallucinations  . Methyldopa Other (See Comments)    unknown  . Shellfish Allergy Hives  . Tomato Rash    Chief Complaint  Patient presents with  . Acute Visit    sinus infection    HPI:  Symptoms started 05/05/14. Pain in the ears. Felt weak and sore. Sinus and throat drainage. Use Tylenol. Cough n 6.28.15, but not today. Ringing in the ears.  Medications: [name] Medications  New Prescriptions   No medications on file  Previous Medications   ALBUTEROL (PROVENTIL HFA;VENTOLIN HFA) 108 (90 BASE) MCG/ACT INHALER    Inhale 2 puffs into the lungs every 6 (six) hours as needed for wheezing.   ALLOPURINOL (ZYLOPRIM) 100 MG TABLET    Take 100 mg by mouth daily. For gout   ALLOPURINOL (ZYLOPRIM) 100 MG TABLET    TAKE 1 TABLET BY MOUTH EVERY DAY FOR GOUT   ASPIRIN 81 MG TABLET    Take 81 mg by mouth daily.   CALCIUM-VITAMIN D (OSCAL WITH D) 500-200 MG-UNIT PER TABLET    Take 1 tablet by mouth 2 (two) times daily.   CHOLECALCIFEROL (VITAMIN D) 1000 UNITS TABLET    Take 1,000 Units by mouth every other day.   CLONIDINE (CATAPRES) 0.1 MG TABLET    Take 1 tablet (0.1 mg total) by mouth every 12 (twelve) hours as needed. For SBP>160   DICLOFENAC SODIUM (VOLTAREN) 1 % GEL    Apply 2 g topically 2 (two) times daily. For pain   GLUCOSE BLOOD (ONE TOUCH ULTRA TEST) TEST STRIP    Check blood sugar once daily DX 250.00   HYDRALAZINE (APRESOLINE) 25 MG TABLET    Take 1 tablet (25 mg total) by mouth 3 (three) times daily. For HTN   LABETALOL (NORMODYNE) 100 MG TABLET    Take 2 tablets (200 mg total) by mouth 2 (two) times daily.   MECLIZINE  (ANTIVERT) 12.5 MG TABLET    Take 1 tablet (12.5 mg total) by mouth 3 (three) times daily as needed for dizziness.   METFORMIN (GLUCOPHAGE) 500 MG TABLET    Take 500 mg by mouth daily with breakfast. For diabetes   POLYVINYL ALCOHOL (LIQUIFILM TEARS) 1.4 % OPHTHALMIC SOLUTION    Place 1 drop into both eyes as needed (for dry eyes).   PREDNISONE (DELTASONE) 5 MG TABLET    Take 1 tablet (5 mg total) by mouth daily before breakfast.   TRAMADOL (ULTRAM) 50 MG TABLET    Take 1-2 tablets (50-100 mg total) by mouth every 6 (six) hours as needed for moderate pain.   TRIAMCINOLONE CREAM (KENALOG) 0.1 %    Apply 1 application topically 2 (two) times daily. For affected area   VOLTAREN 1 % GEL    APPLY TOPICALLY 3 TO 4 TIMES DAILY.  Modified Medications   No medications on file  Discontinued Medications   No medications on file     Review of Systems  Constitutional: Negative for fever, chills and appetite change.  HENT: Positive for ear pain, hearing loss,  postnasal drip, sinus pressure, sore throat and tinnitus. Negative for congestion, trouble swallowing and voice change.   Eyes: Negative.   Respiratory: Positive for cough and chest tightness. Negative for choking, shortness of breath and wheezing.   Cardiovascular: Negative for chest pain, palpitations and leg swelling.  Gastrointestinal: Negative for abdominal pain, diarrhea and constipation.  Endocrine: Negative.   Genitourinary: Negative for dysuria.  Musculoskeletal: Positive for arthralgias.  Skin: Negative.   Neurological: Negative for dizziness and light-headedness.  Hematological: Negative.   Psychiatric/Behavioral: Negative for behavioral problems and agitation.    Filed Vitals:   05/08/14 1537  BP: 182/110  Pulse: 112  Temp: 98.4 F (36.9 C)  TempSrc: Oral  Resp: 18  Height: 5\' 4"  (1.626 m)  Weight: 194 lb 6.4 oz (88.179 kg)  SpO2: 96%   Body mass index is 33.35 kg/(m^2).  Physical Exam  Constitutional: She is oriented  to person, place, and time. No distress.  obese  HENT:  Head: Normocephalic and atraumatic.  Right Ear: External ear normal.  Left Ear: External ear normal.  Nose: Nose normal.  Mouth/Throat: Oropharynx is clear and moist.  Oropharyngeal erythema. Tender to percussion on the maxillary sinuses.  Eyes: Conjunctivae are normal. Pupils are equal, round, and reactive to light.  Neck: Normal range of motion. Neck supple. No JVD present. No tracheal deviation present.  Cardiovascular: Normal rate, regular rhythm and normal heart sounds.   No murmur heard. Pulmonary/Chest: Effort normal and breath sounds normal. No respiratory distress. She has no wheezes. She has no rales. She exhibits no tenderness.  Abdominal: Soft. Bowel sounds are normal. She exhibits no distension. There is no tenderness.  Musculoskeletal: Normal range of motion. She exhibits no edema.  Lymphadenopathy:    She has no cervical adenopathy.  Neurological: She is alert and oriented to person, place, and time. She has normal reflexes.  Skin: Skin is warm and dry. She is not diaphoretic.  Psychiatric: She has a normal mood and affect. Her behavior is normal.     Labs reviewed: Office Visit on 05/01/2014  Component Date Value Ref Range Status  . Hemoglobin A1C 05/01/2014 6.9* 4.8 - 5.6 % Final   Comment:          Increased risk for diabetes: 5.7 - 6.4                                   Diabetes: >6.4                                   Glycemic control for adults with diabetes: <7.0  . Estimated average glucose 05/01/2014 151   Final  . TSH 05/01/2014 2.510  0.450 - 4.500 uIU/mL Final  . Glucose 05/01/2014 91  65 - 99 mg/dL Final  . BUN 05/03/2014 25  8 - 27 mg/dL Final  . Creatinine, Ser 05/01/2014 1.09* 0.57 - 1.00 mg/dL Final  . GFR calc non Af Amer 05/01/2014 47* >59 mL/min/1.73 Final  . GFR calc Af Amer 05/01/2014 55* >59 mL/min/1.73 Final  . BUN/Creatinine Ratio 05/01/2014 23  11 - 26 Final  . Sodium 05/01/2014 138   134 - 144 mmol/L Final  . Potassium 05/01/2014 3.7  3.5 - 5.2 mmol/L Final  . Chloride 05/01/2014 98  97 - 108 mmol/L Final  . CO2 05/01/2014 23  18 - 29 mmol/L  Final  . Calcium 05/01/2014 10.3  8.7 - 10.3 mg/dL Final  . WBC 34/28/7681 12.2* 3.4 - 10.8 x10E3/uL Final  . RBC 05/01/2014 4.28  3.77 - 5.28 x10E6/uL Final  . Hemoglobin 05/01/2014 12.4  11.1 - 15.9 g/dL Final  . HCT 15/72/6203 37.5  34.0 - 46.6 % Final  . MCV 05/01/2014 88  79 - 97 fL Final  . MCH 05/01/2014 29.0  26.6 - 33.0 pg Final  . MCHC 05/01/2014 33.1  31.5 - 35.7 g/dL Final  . RDW 55/97/4163 17.1* 12.3 - 15.4 % Final  . Neutrophils Relative % 05/01/2014 57   Final  . Lymphs 05/01/2014 33   Final  . Monocytes 05/01/2014 8   Final  . Eos 05/01/2014 2   Final  . Basos 05/01/2014 0   Final  . Neutrophils Absolute 05/01/2014 6.8  1.4 - 7.0 x10E3/uL Final  . Lymphocytes Absolute 05/01/2014 4.1* 0.7 - 3.1 x10E3/uL Final  . Monocytes Absolute 05/01/2014 1.0* 0.1 - 0.9 x10E3/uL Final  . Eosinophils Absolute 05/01/2014 0.3  0.0 - 0.4 x10E3/uL Final  . Basophils Absolute 05/01/2014 0.0  0.0 - 0.2 x10E3/uL Final  . Immature Granulocytes 05/01/2014 0   Final  . Immature Grans (Abs) 05/01/2014 0.0  0.0 - 0.1 x10E3/uL Final      Assessment/Plan  1. Acute maxillary sinusitis, recurrence not specified - Pseudoephedrine-Guaifenesin (MUCINEX D) [id number] MG TB12; One every 12 hours for congestion  Dispense: 20 each; Refill: 0 - naproxen (NAPROSYN) 500 MG tablet; Take 1 tablet (500 mg total) by mouth 2 (two) times daily with a meal.  Dispense: 30 tablet; Refill: 0

## 2014-05-18 ENCOUNTER — Other Ambulatory Visit: Payer: Self-pay | Admitting: *Deleted

## 2014-05-18 MED ORDER — TRAMADOL HCL 50 MG PO TABS: 50.0000 mg | ORAL_TABLET | Freq: Four times a day (QID) | ORAL | Status: DC | PRN

## 2014-05-18 MED ORDER — GLUCOSE BLOOD VI STRP: ORAL_STRIP | Status: DC

## 2014-05-18 NOTE — Telephone Encounter (Signed)
Patient daughter called wanting a refill on her Tramadol and test strips. Spoke with Pharmacist at pharmacy and called these in.

## 2014-05-31 ENCOUNTER — Telehealth: Payer: Self-pay | Admitting: *Deleted

## 2014-05-31 NOTE — Telephone Encounter (Signed)
Patient called and left message and stated that she is having pain all over. I called patient and she stated that it is a lot better now and will wait till Tuesday to discuss with Dr. Glade Lloyd.

## 2014-06-05 ENCOUNTER — Other Ambulatory Visit: Payer: Self-pay | Admitting: *Deleted

## 2014-06-05 ENCOUNTER — Ambulatory Visit (INDEPENDENT_AMBULATORY_CARE_PROVIDER_SITE_OTHER): Payer: Medicare Other | Admitting: Internal Medicine

## 2014-06-05 ENCOUNTER — Ambulatory Visit: Payer: Medicare Other | Admitting: Internal Medicine

## 2014-06-05 ENCOUNTER — Encounter: Payer: Self-pay | Admitting: Internal Medicine

## 2014-06-05 VITALS — BP 162/88 | HR 84 | Temp 98.1°F | Wt 197.8 lb

## 2014-06-05 DIAGNOSIS — E119 Type 2 diabetes mellitus without complications: Secondary | ICD-10-CM

## 2014-06-05 DIAGNOSIS — N183 Chronic kidney disease, stage 3 unspecified: Secondary | ICD-10-CM

## 2014-06-05 DIAGNOSIS — I2510 Atherosclerotic heart disease of native coronary artery without angina pectoris: Secondary | ICD-10-CM

## 2014-06-05 DIAGNOSIS — E1129 Type 2 diabetes mellitus with other diabetic kidney complication: Secondary | ICD-10-CM

## 2014-06-05 DIAGNOSIS — E1122 Type 2 diabetes mellitus with diabetic chronic kidney disease: Secondary | ICD-10-CM | POA: Insufficient documentation

## 2014-06-05 DIAGNOSIS — R600 Localized edema: Secondary | ICD-10-CM

## 2014-06-05 DIAGNOSIS — E1121 Type 2 diabetes mellitus with diabetic nephropathy: Secondary | ICD-10-CM | POA: Insufficient documentation

## 2014-06-05 DIAGNOSIS — I129 Hypertensive chronic kidney disease with stage 1 through stage 4 chronic kidney disease, or unspecified chronic kidney disease: Secondary | ICD-10-CM

## 2014-06-05 DIAGNOSIS — IMO0002 Reserved for concepts with insufficient information to code with codable children: Secondary | ICD-10-CM | POA: Insufficient documentation

## 2014-06-05 DIAGNOSIS — R609 Edema, unspecified: Secondary | ICD-10-CM

## 2014-06-05 MED ORDER — HYDRALAZINE HCL 25 MG PO TABS: 25.0000 mg | ORAL_TABLET | Freq: Four times a day (QID) | ORAL | Status: DC

## 2014-06-05 MED ORDER — LISINOPRIL 5 MG PO TABS: 10.0000 mg | ORAL_TABLET | Freq: Every day | ORAL | Status: DC

## 2014-06-05 NOTE — Progress Notes (Signed)
Patient ID: [name] [name], female   DOB: 1930-05-12, 80 y.o.   MRN: [id number]    Chief Complaint  Patient presents with  . Medical Management of Chronic Issues    3 month f/u with labs done on 05/01/14 & discuss Genetic Testing Results.  Marland Kitchen other    fall screening done, no refills needed.   HPI 80 y/o female patient is here for follow up with her daughter.  Continues to be on prednisone and has rheumatology appointment next week No recent gout flare up. Taking her allopurinol SBP elevated in office At home SBP ranging between 150-160/ DBP 80-90  Review of Systems   Constitutional: Negative for fever, chills, diaphoresis.   HENT: Negative for congestion, hearing loss and sore throat.    Eyes: Negative for blurred vision, double vision and discharge.   Respiratory: Negative for cough, sputum production, shortness of breath and wheezing.    Cardiovascular: Negative for chest pain, palpitations, orthopnea. Has leg swelling.   Gastrointestinal: Negative for heartburn, nausea, vomiting, abdominal pain, diarrhea and constipation.   Genitourinary: Negative for dysuria, urgency, frequency and flank pain.   Musculoskeletal:has joint pain and low back pain better controlled. No falls reported Skin: Negative for itching and rash.   Neurological: Negative for dizziness, tingling, focal weakness and headaches.   Psychiatric/Behavioral: Negative for depression and memory loss. The patient is not nervous/anxious.   Past Medical History  Diagnosis Date  . Hypertension   . First degree atrioventricular block   . Onychia and paronychia of toe   . Muscle weakness (generalized)   . Allergic rhinitis due to pollen 04/27/2007  . Gout, unspecified 04/19/2013  . Disorder of bone and cartilage, unspecified   . Edema 05/03/2013  . Hyperlipidemia 04/27/2007  . Unspecified essential hypertension 08/08/2013  . Coronary atherosclerosis of native coronary artery   . Other malaise and fatigue   . Cough     . Other fall   . Unspecified vitamin D deficiency   . TIA (transient ischemic attack)     "a few one summer" (08/07/2013)  . Myocardial infarction ~ 1970  . Exertional shortness of breath     "sometimes" (08/07/2013)  . Type II diabetes mellitus     "fasting 90-110s" (08/07/2013)  . Osteoarthrosis, unspecified whether generalized or localized, unspecified site 04/27/2007  . Prepatellar bursitis   . Arthritis     "in q joint" (08/07/2013)  . Stroke 01/17/2014  . Seizures    Current Outpatient Prescriptions on File Prior to Visit  Medication Sig Dispense Refill  . albuterol (PROVENTIL HFA;VENTOLIN HFA) 108 (90 BASE) MCG/ACT inhaler Inhale 2 puffs into the lungs every 6 (six) hours as needed for wheezing.  1 Inhaler  0  . allopurinol (ZYLOPRIM) 100 MG tablet TAKE 1 TABLET BY MOUTH EVERY DAY FOR GOUT  30 tablet  2  . aspirin 81 MG tablet Take 81 mg by mouth daily.      . calcium-vitamin D (OSCAL WITH D) 500-200 MG-UNIT per tablet Take 1 tablet by mouth 2 (two) times daily.      . cholecalciferol (VITAMIN D) 1000 UNITS tablet Take 1,000 Units by mouth every other day.      . cloNIDine (CATAPRES) 0.1 MG tablet Take 1 tablet (0.1 mg total) by mouth every 12 (twelve) hours as needed. For SBP>160  60 tablet  0  . diclofenac sodium (VOLTAREN) 1 % GEL Apply 2 g topically 2 (two) times daily. For pain      . glucose  blood (ONE TOUCH ULTRA TEST) test strip Check blood sugar once daily DX 250.00  100 each  3  . labetalol (NORMODYNE) 100 MG tablet Take 2 tablets (200 mg total) by mouth 2 (two) times daily.  120 tablet  1  . metFORMIN (GLUCOPHAGE) 500 MG tablet Take 500 mg by mouth daily with breakfast. For diabetes      . polyvinyl alcohol (LIQUIFILM TEARS) 1.4 % ophthalmic solution Place 1 drop into both eyes as needed (for dry eyes).      . predniSONE (DELTASONE) 5 MG tablet Take 1 tablet (5 mg total) by mouth daily before breakfast.  31 tablet  0  . traMADol (ULTRAM) 50 MG tablet Take 1-2 tablets  (50-100 mg total) by mouth every 6 (six) hours as needed for moderate pain.  120 tablet  0  . triamcinolone cream (KENALOG) 0.1 % Apply 1 application topically 2 (two) times daily. For affected area      . VOLTAREN 1 % GEL APPLY TOPICALLY 3 TO 4 TIMES DAILY.  100 g  1   No current facility-administered medications on file prior to visit.    Physical exam BP 162/88  Pulse 84  Temp(Src) 98.1 F (36.7 C) (Oral)  Wt 197 lb 12.8 oz (89.721 kg)  SpO2 96%  General- elderly female in no acute distress, obese Head- atraumatic, normocephalic Eyes- PERRLA, EOMI, no pallor, no icterus, no discharge Neck- no lymphadenopathy, no thyromegaly Cardiovascular- normal s1,s2, no murmurs/ rubs/ gallops, normal distal pulses Respiratory- bilateral clear to auscultation, no wheeze, no rhonchi, no crackles Abdomen- bowel sounds present, soft, non tender Musculoskeletal- able to move all 4 extremities, foot and ankle edema present. Limited ROM, on wheelchair Neurological- no focal deficit, normal foot exam with good dorsalis pedis, normal microfilament and vibration test Skin- warm and dry Nails- ingrown great toe nail in both feet Psychiatry- alert and oriented to person, place and time, normal mood and affect  labs Lab Results  Component Value Date   URICACID 7.5* 01/12/2014   Lab Results  Component Value Date   HGBA1C 6.9* 05/01/2014   Lab Results  Component Value Date   CREATININE 1.09* 05/01/2014   Lipid Panel     Component Value Date/Time   CHOL 171 01/18/2014 0043   TRIG 117 01/18/2014 0043   HDL 71 01/18/2014 0043   HDL 81 01/12/2014 1148   CHOLHDL 2.4 01/18/2014 0043   CHOLHDL 2.5 01/12/2014 1148   VLDL 23 01/18/2014 0043   LDLCALC 77 01/18/2014 0043   LDLCALC 103* 01/12/2014 1148     Assessment/plan  1. Type II or unspecified type diabetes mellitus without mention of complication, not stated as uncontrolled See below - Microalbumin/Creatinine Ratio, Urine  2. Chronic steroid use - DG  Bone Density; Future. Continue ca-vit d supplement, continue walker  3. Bilateral edema of lower extremity Likely dependent edema- leg elevation at rest and if does not help, will try ted hose. Avoid diuretics for now with poor renal function  4. Type 2 diabetes mellitus with other diabetic kidney complication Reviewed her recent a1c. Continue metformin. Clarified with pharmacy on script for test strips to check cbg at home and they are waiting on her medicaid care. Pt notified about this and will submit a copy to pharmacy. Monitor cbg and bring reading next visit. Continue asa, will start ACEI from today, see below. Lipid at goal  5. CKD (chronic kidney disease) stage 3, GFR 30-59 ml/min With her HTN and DM likely contributing to this  monitor kidney function, will need bp better controlled. Pending microalbumin urine test  6. Hypertensive renal disease, stage 1-4 or unspecified chronic kidney disease With elevated bp readings and now poor kidney function, will increase hydralazine to 25 mg Qid for now, reassess in a month. Warning signs with elevated bp explained. Continue clonidine prn and labetaolol bid. Also added lisinopril 5 mg daily for renal protection. Check bp at home and bring recording and bp ma chine for calibration next visit

## 2014-06-05 NOTE — Patient Instructions (Signed)
Take your hydralazine four times a day at 9 am, 1 pm, 5 pm and 10 pm. Check blood pressure twice a day and bring home bp machine and readings note book on your next visit  don't forget to take new blood pressure medicine lisinopril 5 mg once a day as well with your other medications

## 2014-06-06 ENCOUNTER — Ambulatory Visit: Payer: Medicare Other | Admitting: Podiatry

## 2014-06-06 LAB — MICROALBUMIN / CREATININE URINE RATIO
Creatinine, Ur: 24.3 mg/dL (ref 15.0–278.0)
MICROALB/CREAT RATIO: 151.9 mg/g creat — ABNORMAL HIGH (ref 0.0–30.0)
Microalbumin, Urine: 36.9 ug/mL — ABNORMAL HIGH (ref 0.0–17.0)

## 2014-06-07 ENCOUNTER — Telehealth: Payer: Self-pay | Admitting: *Deleted

## 2014-06-07 ENCOUNTER — Other Ambulatory Visit: Payer: Self-pay | Admitting: *Deleted

## 2014-06-07 MED ORDER — LISINOPRIL 5 MG PO TABS: ORAL_TABLET | ORAL | Status: DC

## 2014-06-07 MED ORDER — HYDRALAZINE HCL 25 MG PO TABS: 25.0000 mg | ORAL_TABLET | Freq: Four times a day (QID) | ORAL | Status: DC

## 2014-06-07 NOTE — Telephone Encounter (Signed)
Para March, daughter, called and stated that patient is having a deep pain around her neck and back area. They cannot even move her due to the severe pain. Patient states it almost is muscle type thing. Feels it is different than before. BP is up the top # was 166/93 and they had to give her the Clonidine about 3 hours ago. She stated that you gave her a new BP medication at last visit and they went and got that filled and wants to know if they should go ahead and give this to her to? They don't know what to do about her pain. Please Advise.

## 2014-06-08 ENCOUNTER — Encounter: Payer: Self-pay | Admitting: *Deleted

## 2014-06-08 MED ORDER — METHOCARBAMOL 500 MG PO TABS: ORAL_TABLET | ORAL | Status: DC

## 2014-06-08 NOTE — Telephone Encounter (Signed)
Ok to give the new lisinopril on daily basis. thepain must have caused the bp to rise further. Sound like a muscle pull. Lets try robaxin 500 mg every 8 hour as needed for muscle spasm- give only 5 days supply. Can take tylenol extra strength and her current pain meds as needed. If has severe pain or weakness/ tingling in arm to be seen in urgent care. If tolerable and If no improvement by Monday, to be seen in clinic with me on Tuesday.

## 2014-06-08 NOTE — Telephone Encounter (Signed)
Patient daughter Notified and faxed Rx into pharmacy 

## 2014-06-11 ENCOUNTER — Telehealth: Payer: Self-pay | Admitting: *Deleted

## 2014-06-11 NOTE — Telephone Encounter (Signed)
Called [employer] for Prior Auth for patient's Methocarbamol and spoke with Madelaine Bhat. He stated that the system was down and faxed a prior auth form to fill out and fax back. Filled this out on Friday (06/08/2014) and faxed back to them at fax #: [phone]. Waiting for response.

## 2014-06-12 ENCOUNTER — Encounter (HOSPITAL_COMMUNITY): Payer: Self-pay | Admitting: Emergency Medicine

## 2014-06-12 ENCOUNTER — Telehealth: Payer: Self-pay

## 2014-06-12 ENCOUNTER — Emergency Department (HOSPITAL_COMMUNITY)
Admission: EM | Admit: 2014-06-12 | Discharge: 2014-06-12 | Disposition: A | Discharge status: Home / Self Care | Payer: Medicare Other | Attending: Emergency Medicine | Admitting: Emergency Medicine

## 2014-06-12 ENCOUNTER — Emergency Department (HOSPITAL_COMMUNITY): Payer: Medicare Other

## 2014-06-12 DIAGNOSIS — M069 Rheumatoid arthritis, unspecified: Secondary | ICD-10-CM | POA: Diagnosis not present

## 2014-06-12 DIAGNOSIS — Z8673 Personal history of transient ischemic attack (TIA), and cerebral infarction without residual deficits: Secondary | ICD-10-CM | POA: Insufficient documentation

## 2014-06-12 DIAGNOSIS — M1990 Unspecified osteoarthritis, unspecified site: Secondary | ICD-10-CM | POA: Insufficient documentation

## 2014-06-12 DIAGNOSIS — Z791 Long term (current) use of non-steroidal anti-inflammatories (NSAID): Secondary | ICD-10-CM | POA: Diagnosis not present

## 2014-06-12 DIAGNOSIS — I252 Old myocardial infarction: Secondary | ICD-10-CM | POA: Diagnosis not present

## 2014-06-12 DIAGNOSIS — M546 Pain in thoracic spine: Secondary | ICD-10-CM | POA: Insufficient documentation

## 2014-06-12 DIAGNOSIS — I10 Essential (primary) hypertension: Secondary | ICD-10-CM | POA: Diagnosis not present

## 2014-06-12 DIAGNOSIS — K5730 Diverticulosis of large intestine without perforation or abscess without bleeding: Secondary | ICD-10-CM | POA: Insufficient documentation

## 2014-06-12 DIAGNOSIS — M109 Gout, unspecified: Secondary | ICD-10-CM | POA: Diagnosis not present

## 2014-06-12 DIAGNOSIS — E559 Vitamin D deficiency, unspecified: Secondary | ICD-10-CM | POA: Diagnosis not present

## 2014-06-12 DIAGNOSIS — R0789 Other chest pain: Secondary | ICD-10-CM | POA: Diagnosis not present

## 2014-06-12 DIAGNOSIS — Z7982 Long term (current) use of aspirin: Secondary | ICD-10-CM | POA: Diagnosis not present

## 2014-06-12 DIAGNOSIS — E119 Type 2 diabetes mellitus without complications: Secondary | ICD-10-CM | POA: Insufficient documentation

## 2014-06-12 DIAGNOSIS — IMO0002 Reserved for concepts with insufficient information to code with codable children: Secondary | ICD-10-CM | POA: Diagnosis not present

## 2014-06-12 DIAGNOSIS — I2510 Atherosclerotic heart disease of native coronary artery without angina pectoris: Secondary | ICD-10-CM | POA: Diagnosis not present

## 2014-06-12 DIAGNOSIS — Z79899 Other long term (current) drug therapy: Secondary | ICD-10-CM | POA: Diagnosis not present

## 2014-06-12 LAB — CBC
HCT: 30.2 % — ABNORMAL LOW (ref 36.0–46.0)
Hemoglobin: 9.9 g/dL — ABNORMAL LOW (ref 12.0–15.0)
MCH: 28.3 pg (ref 26.0–34.0)
MCHC: 32.8 g/dL (ref 30.0–36.0)
MCV: 86.3 fL (ref 78.0–100.0)
Platelets: 365 10*3/uL (ref 150–400)
RBC: 3.5 MIL/uL — ABNORMAL LOW (ref 3.87–5.11)
RDW: 15.5 % (ref 11.5–15.5)
WBC: 10 10*3/uL (ref 4.0–10.5)

## 2014-06-12 LAB — URINALYSIS, ROUTINE W REFLEX MICROSCOPIC
Bilirubin Urine: NEGATIVE
Glucose, UA: NEGATIVE mg/dL
Hgb urine dipstick: NEGATIVE
Ketones, ur: NEGATIVE mg/dL
Leukocytes, UA: NEGATIVE
Nitrite: NEGATIVE
Protein, ur: NEGATIVE mg/dL
Specific Gravity, Urine: 1.006 (ref 1.005–1.030)
Urobilinogen, UA: 0.2 mg/dL (ref 0.0–1.0)
pH: 7.5 (ref 5.0–8.0)

## 2014-06-12 LAB — TROPONIN I
Troponin I: <0.3 ng/mL (ref <0.30)
Troponin I: <0.3 ng/mL (ref <0.30)

## 2014-06-12 LAB — COMPREHENSIVE METABOLIC PANEL
ALT: 6 U/L (ref 0–35)
AST: 12 U/L (ref 0–37)
Albumin: 3.1 g/dL — ABNORMAL LOW (ref 3.5–5.2)
Alkaline Phosphatase: 62 U/L (ref 39–117)
Anion gap: 16 — ABNORMAL HIGH (ref 5–15)
BUN: 17 mg/dL (ref 6–23)
CO2: 22 mEq/L (ref 19–32)
Calcium: 9.7 mg/dL (ref 8.4–10.5)
Chloride: 100 mEq/L (ref 96–112)
Creatinine, Ser: 0.71 mg/dL (ref 0.50–1.10)
GFR calc Af Amer: >90 mL/min (ref >90)
GFR calc non Af Amer: 78 mL/min — ABNORMAL LOW (ref >90)
Glucose, Bld: 161 mg/dL — ABNORMAL HIGH (ref 70–99)
Potassium: 3.9 mEq/L (ref 3.7–5.3)
Sodium: 138 mEq/L (ref 137–147)
Total Bilirubin: 0.4 mg/dL (ref 0.3–1.2)
Total Protein: 7.7 g/dL (ref 6.0–8.3)

## 2014-06-12 LAB — PRO B NATRIURETIC PEPTIDE: Pro B Natriuretic peptide (BNP): 369.9 pg/mL (ref 0–450)

## 2014-06-12 MED ORDER — HYDROMORPHONE HCL PF 1 MG/ML IJ SOLN
0.5000 mg | Freq: Once | INTRAMUSCULAR | Status: AC
  Administered 2014-06-12: 0.5 mg via INTRAVENOUS
  Filled 2014-06-12: qty 1

## 2014-06-12 MED ORDER — HYDROCODONE-ACETAMINOPHEN 5-325 MG PO TABS: 1.0000 | ORAL_TABLET | ORAL | Status: DC | PRN

## 2014-06-12 MED ORDER — SODIUM CHLORIDE 0.9 % IV SOLN
1000.0000 mL | Freq: Once | INTRAVENOUS | Status: AC
  Administered 2014-06-12: 1000 mL via INTRAVENOUS

## 2014-06-12 MED ORDER — IOHEXOL 350 MG/ML SOLN
100.0000 mL | Freq: Once | INTRAVENOUS | Status: AC | PRN
  Administered 2014-06-12: 100 mL via INTRAVENOUS

## 2014-06-12 NOTE — Discharge Instructions (Signed)
Arthralgia °Your caregiver has diagnosed you as suffering from an arthralgia. Arthralgia means there is pain in a joint. This can come from many reasons including: °· Bruising the joint which causes soreness (inflammation) in the joint. °· Wear and tear on the joints which occur as we grow older (osteoarthritis). °· Overusing the joint. °· Various forms of arthritis. °· Infections of the joint. °Regardless of the cause of pain in your joint, most of these different pains respond to anti-inflammatory drugs and rest. The exception to this is when a joint is infected, and these cases are treated with antibiotics, if it is a bacterial infection. °HOME CARE INSTRUCTIONS  °· Rest the injured area for as long as directed by your caregiver. Then slowly start using the joint as directed by your caregiver and as the pain allows. Crutches as directed may be useful if the ankles, knees or hips are involved. If the knee was splinted or casted, continue use and care as directed. If an stretchy or elastic wrapping bandage has been applied today, it should be removed and re-applied every 3 to 4 hours. It should not be applied tightly, but firmly enough to keep swelling down. Watch toes and feet for swelling, bluish discoloration, coldness, numbness or excessive pain. If any of these problems (symptoms) occur, remove the ace bandage and re-apply more loosely. If these symptoms persist, contact your caregiver or return to this location. °· For the first 24 hours, keep the injured extremity elevated on pillows while lying down. °· Apply ice for 15-20 minutes to the sore joint every couple hours while awake for the first half day. Then 03-04 times per day for the first 48 hours. Put the ice in a plastic bag and place a towel between the bag of ice and your skin. °· Wear any splinting, casting, elastic bandage applications, or slings as instructed. °· Only take over-the-counter or prescription medicines for pain, discomfort, or fever as  directed by your caregiver. Do not use aspirin immediately after the injury unless instructed by your physician. Aspirin can cause increased bleeding and bruising of the tissues. °· If you were given crutches, continue to use them as instructed and do not resume weight bearing on the sore joint until instructed. °Persistent pain and inability to use the sore joint as directed for more than 2 to 3 days are warning signs indicating that you should see a caregiver for a follow-up visit as soon as possible. Initially, a hairline fracture (break in bone) may not be evident on X-rays. Persistent pain and swelling indicate that further evaluation, non-weight bearing or use of the joint (use of crutches or slings as instructed), or further X-rays are indicated. X-rays may sometimes not show a small fracture until a week or 10 days later. Make a follow-up appointment with your own caregiver or one to whom we have referred you. A radiologist (specialist in reading X-rays) may read your X-rays. Make sure you know how you are to obtain your X-ray results. Do not assume everything is normal if you do not hear from us. °SEEK MEDICAL CARE IF: °Bruising, swelling, or pain increases. °SEEK IMMEDIATE MEDICAL CARE IF:  °· Your fingers or toes are numb or blue. °· The pain is not responding to medications and continues to stay the same or get worse. °· The pain in your joint becomes severe. °· You develop a fever over 102° F (38.9° C). °· It becomes impossible to move or use the joint. °MAKE SURE YOU:  °·   Understand these instructions. °· Will watch your condition. °· Will get help right away if you are not doing well or get worse. °Document Released: 10/26/2005 Document Revised: 01/18/2012 Document Reviewed: 06/13/2008 °[employer]® Patient Information ©2015 [employer]. This information is not intended to replace advice given to you by your health care provider. Make sure you discuss any questions you have with your health care  provider. °Back Pain, Adult °Low back pain is very common. About 1 in 5 people have back pain. The cause of low back pain is rarely dangerous. The pain often gets better over time. About half of people with a sudden onset of back pain feel better in just 2 weeks. About 8 in 10 people feel better by 6 weeks.  °CAUSES °Some common causes of back pain include: °· Strain of the muscles or ligaments supporting the spine. °· Wear and tear (degeneration) of the spinal discs. °· Arthritis. °· Direct injury to the back. °DIAGNOSIS °Most of the time, the direct cause of low back pain is not known. However, back pain can be treated effectively even when the exact cause of the pain is unknown. Answering your caregiver's questions about your overall health and symptoms is one of the most accurate ways to make sure the cause of your pain is not dangerous. If your caregiver needs more information, he or she may order lab work or imaging tests (X-rays or MRIs). However, even if imaging tests show changes in your back, this usually does not require surgery. °HOME CARE INSTRUCTIONS °For many people, back pain returns. Since low back pain is rarely dangerous, it is often a condition that people can learn to manage on their own.  °· Remain active. It is stressful on the back to sit or stand in one place. Do not sit, drive, or stand in one place for more than 30 minutes at a time. Take short walks on level surfaces as soon as pain allows. Try to increase the length of time you walk each day. °· Do not stay in bed. Resting more than 1 or 2 days can delay your recovery. °· Do not avoid exercise or work. Your body is made to move. It is not dangerous to be active, even though your back may hurt. Your back will likely heal faster if you return to being active before your pain is gone. °· Pay attention to your body when you  bend and lift. Many people have less discomfort when lifting if they bend their knees, keep the load close to their  bodies, and avoid twisting. Often, the most comfortable positions are those that put less stress on your recovering back. °· Find a comfortable position to sleep. Use a firm mattress and lie on your side with your knees slightly bent. If you lie on your back, put a pillow under your knees. °· Only take over-the-counter or prescription medicines as directed by your caregiver. Over-the-counter medicines to reduce pain and inflammation are often the most helpful. Your caregiver may prescribe muscle relaxant drugs. These medicines help dull your pain so you can more quickly return to your normal activities and healthy exercise. °· Put ice on the injured area. °¨ Put ice in a plastic bag. °¨ Place a towel between your skin and the bag. °¨ Leave the ice on for 15-20 minutes, 03-04 times a day for the first 2 to 3 days. After that, ice and heat may be alternated to reduce pain and spasms. °· Ask your caregiver about trying back exercises and gentle massage. This may be of some benefit. °· Avoid feeling anxious or stressed. Stress   increases muscle tension and can worsen back pain.It is important to recognize when you are anxious or stressed and learn ways to manage it.Exercise is a great option. SEEK MEDICAL CARE IF:  You have pain that is not relieved with rest or medicine.  You have pain that does not improve in 1 week.  You have new symptoms.  You are generally not feeling well. SEEK IMMEDIATE MEDICAL CARE IF:   You have pain that radiates from your back into your legs.  You develop new bowel or bladder control problems.  You have unusual weakness or numbness in your arms or legs.  You develop nausea or vomiting.  You develop abdominal pain.  You feel faint. Document Released: 10/26/2005 Document Revised: 04/26/2012 Document Reviewed: 02/27/2014 [facility] Patient Information 2015 [place], [place]. This information is not intended to replace advice given to you by your health care provider. Make  sure you discuss any questions you have with your health care provider.

## 2014-06-12 NOTE — ED Provider Notes (Signed)
CSN: [id number]     Arrival date & time 06/12/14  1025 History   First MD Initiated Contact with Patient 06/12/14 1033     Chief Complaint  Patient presents with  . Pain     (Consider location/radiation/quality/duration/timing/severity/associated sxs/prior Treatment) Patient is a 80 y.o. female presenting with back pain.  Back Pain Location:  Thoracic spine Quality:  Stabbing Radiates to: R chest wall. Pain severity:  Severe Pain is:  Same all the time Onset quality:  Gradual Duration:  1 week Timing:  Constant Progression:  Worsening Chronicity:  New Context: not emotional stress and not falling   Relieved by:  Nothing Worsened by:  Palpation, coughing, deep breathing, touching, standing and movement Associated symptoms: no abdominal pain, no bladder incontinence, no bowel incontinence, no chest pain, no dysuria, no fever, no numbness, no perianal numbness and no tingling     Past Medical History  Diagnosis Date  . Hypertension   . First degree atrioventricular block   . Onychia and paronychia of toe   . Muscle weakness (generalized)   . Allergic rhinitis due to pollen 04/27/2007  . Gout, unspecified 04/19/2013  . Disorder of bone and cartilage, unspecified   . Edema 05/03/2013  . Hyperlipidemia 04/27/2007  . Unspecified essential hypertension 08/08/2013  . Coronary atherosclerosis of native coronary artery   . Other malaise and fatigue   . Cough   . Other fall   . Unspecified vitamin D deficiency   . TIA (transient ischemic attack)     "a few one summer" (08/07/2013)  . Myocardial infarction ~ 1970  . Exertional shortness of breath     "sometimes" (08/07/2013)  . Type II diabetes mellitus     "fasting 90-110s" (08/07/2013)  . Osteoarthrosis, unspecified whether generalized or localized, unspecified site 04/27/2007  . Prepatellar bursitis   . Arthritis     "in q joint" (08/07/2013)  . Stroke 01/17/2014  . Seizures   . Muscle spasm    Past Surgical History   Procedure Laterality Date  . Tonsillectomy  08/1974  . Replacement total knee Right 09/2005  . Shoulder open rotator cuff repair Right 07/1999  . Appendectomy  1960  . Abdominal hysterectomy  04/1980  . Transthoracic echocardiogram  2003    EF 55-65%; mild concentric LVH  . Joint replacement    . Total knee arthroplasty Left 08/07/2013  . Cataract extraction w/ intraocular lens  implant, bilateral Bilateral ~ 2012  . Total knee arthroplasty Left 08/07/2013    Procedure: TOTAL KNEE ARTHROPLASTY- LEFT;  Surgeon: Dannielle Huh, MD;  Location: [facility] OR;  Service: Orthopedics;  Laterality: Left;  . Cardiac catheterization  2003   Family History  Problem Relation Age of Onset  . Heart attack Father    History  Substance Use Topics  . Smoking status: Never Smoker   . Smokeless tobacco: Never Used  . Alcohol Use: No   OB History   Grav Para Term Preterm Abortions TAB SAB Ect Mult Living                 Review of Systems  Constitutional: Negative for fever and chills.  HENT: Negative for congestion, rhinorrhea and sore throat.   Eyes: Negative for photophobia and visual disturbance.  Respiratory: Negative for cough and shortness of breath.   Cardiovascular: Negative for chest pain and leg swelling.  Gastrointestinal: Negative for nausea, vomiting, abdominal pain, diarrhea, constipation and bowel incontinence.  Endocrine: Negative for polyphagia and polyuria.  Genitourinary: Negative for bladder  incontinence, dysuria, flank pain, vaginal bleeding, vaginal discharge and enuresis.  Musculoskeletal: Positive for back pain. Negative for gait problem.  Skin: Negative for color change and rash.  Neurological: Negative for dizziness, tingling, syncope, light-headedness and numbness.  Hematological: Negative for adenopathy. Does not bruise/bleed easily.  All other systems reviewed and are negative.     Allergies  Cetirizine hcl; Codeine; Fish allergy; Indomethacin; Lyrica; Methyldopa;  Shellfish allergy; and Tomato  Home Medications   Prior to Admission medications   Medication Sig Start Date End Date Taking? Authorizing Provider  acetaminophen (TYLENOL) 500 MG tablet Take 500 mg by mouth every 6 (six) hours as needed for moderate pain.   Yes Historical Provider, MD  albuterol (PROVENTIL HFA;VENTOLIN HFA) 108 (90 BASE) MCG/ACT inhaler Inhale 2 puffs into the lungs every 6 (six) hours as needed for wheezing. 04/19/13  Yes Oneal Grout, MD  allopurinol (ZYLOPRIM) 100 MG tablet Take 100 mg by mouth daily.   Yes Historical Provider, MD  aspirin 81 MG tablet Take 81 mg by mouth daily.   Yes Historical Provider, MD  calcium-vitamin D (OSCAL WITH D) 500-200 MG-UNIT per tablet Take 1 tablet by mouth 2 (two) times daily.   Yes Historical Provider, MD  cholecalciferol (VITAMIN D) 1000 UNITS tablet Take 1,000 Units by mouth every other day.   Yes Historical Provider, MD  cloNIDine (CATAPRES) 0.1 MG tablet Take 1 tablet (0.1 mg total) by mouth every 12 (twelve) hours as needed. For SBP>160 02/06/14  Yes Mahima Pandey, MD  diclofenac sodium (VOLTAREN) 1 % GEL Apply 2 g topically See admin instructions. Apply three to four times daily.   Yes Historical Provider, MD  glucose blood (ONE TOUCH ULTRA TEST) test strip Check blood sugar once daily DX 250.00 05/18/14  Yes Tiffany L Reed, DO  hydrALAZINE (APRESOLINE) 25 MG tablet Take 1 tablet (25 mg total) by mouth 4 (four) times daily. For HTN 06/07/14  Yes Mahima Pandey, MD  labetalol (NORMODYNE) 100 MG tablet Take 2 tablets (200 mg total) by mouth 2 (two) times daily. 04/04/14  Yes Kimber Relic, MD  lisinopril (PRINIVIL,ZESTRIL) 5 MG tablet Take one tablet by mouth once daily to protect kidneys 06/07/14  Yes Mahima Glade Lloyd, MD  metFORMIN (GLUCOPHAGE) 500 MG tablet Take 500 mg by mouth daily with breakfast. For diabetes   Yes Historical Provider, MD  methocarbamol (ROBAXIN) 500 MG tablet Take one tablet by mouth every 8 hours as needed for muscle  spasms. 06/08/14  Yes Mahima Glade Lloyd, MD  polyvinyl alcohol (LIQUIFILM TEARS) 1.4 % ophthalmic solution Place 1 drop into both eyes as needed (for dry eyes).   Yes Historical Provider, MD  predniSONE (DELTASONE) 5 MG tablet Take 1 tablet (5 mg total) by mouth daily before breakfast. 01/22/14  Yes Rodolph Bong, MD  traMADol (ULTRAM) 50 MG tablet Take 1-2 tablets (50-100 mg total) by mouth every 6 (six) hours as needed for moderate pain. 05/18/14  Yes Tiffany L Reed, DO  triamcinolone cream (KENALOG) 0.1 % Apply 1 application topically 2 (two) times daily. For affected area   Yes Historical Provider, MD  diclofenac sodium (VOLTAREN) 1 % GEL Apply 2 g topically 2 (two) times daily. For pain    Historical Provider, MD  HYDROcodone-acetaminophen (NORCO/VICODIN) 5-325 MG per tablet Take 1 tablet by mouth every 4 (four) hours as needed. 06/12/14   Mirian Mo, MD   BP 177/87  Pulse 97  Temp(Src) 98.4 F (36.9 C) (Oral)  Resp 13  SpO2 98% Physical  Exam  Vitals reviewed. Constitutional: She is oriented to person, place, and time. She appears well-developed and well-nourished.  HENT:  Head: Normocephalic and atraumatic.  Right Ear: External ear normal.  Left Ear: External ear normal.  Eyes: Conjunctivae and EOM are normal. Pupils are equal, round, and reactive to light.  Neck: Normal range of motion. Neck supple.  Cardiovascular: Normal rate, regular rhythm, normal heart sounds and intact distal pulses.   Pulmonary/Chest: Effort normal and breath sounds normal. She exhibits tenderness (from back around R chest wall, no obvious skin lesions or vesicles, no percutaneous hyperesthesia).  Abdominal: Soft. Bowel sounds are normal. There is no tenderness.  Musculoskeletal: Normal range of motion.  Neurological: She is alert and oriented to person, place, and time.  Skin: Skin is warm and dry.    ED Course  Procedures (including critical care time) Labs Review Labs Reviewed  COMPREHENSIVE  METABOLIC PANEL - Abnormal; Notable for the following:    Glucose, Bld 161 (*)    Albumin 3.1 (*)    GFR calc non Af Amer 78 (*)    Anion gap 16 (*)    All other components within normal limits  CBC - Abnormal; Notable for the following:    RBC 3.50 (*)    Hemoglobin 9.9 (*)    HCT 30.2 (*)    All other components within normal limits  TROPONIN I  URINALYSIS, ROUTINE W REFLEX MICROSCOPIC  TROPONIN I  PRO B NATRIURETIC PEPTIDE    Imaging Review Dg Chest 2 View  06/12/2014   CLINICAL DATA:  Pain extending through the back and right side.  EXAM: CHEST  2 VIEW  COMPARISON:  Two-view chest 01/17/2014.  FINDINGS: The heart is enlarged. Mild pulmonary vascular congestion is evident. No focal airspace disease is present. Atherosclerotic changes are noted in the aortic arch. Degenerative changes are again seen in the thoracic spine. Advanced degenerative changes are again noted in the shoulders bilaterally.  IMPRESSION: 1. Cardiomegaly and mild pulmonary vascular congestion compatible with early congestive heart failure. 2. No focal airspace disease. 3. Advanced degenerative changes of the shoulders bilaterally.   Electronically Signed   By: Gennette Pac [name]   On: 06/12/2014 11:45   Ct Angio Chest Aortic Dissect W &/or W/o  06/12/2014   CLINICAL DATA:  chest pain radiating to back and legs as well as neck; PAIN  EXAM: CT ANGIOGRAPHY CHEST, ABDOMEN AND PELVIS  TECHNIQUE: Multidetector CT imaging through the chest, abdomen and pelvis was performed using the standard protocol during bolus administration of intravenous contrast. Multiplanar reconstructed images and MIPs were obtained and reviewed to evaluate the vascular anatomy.  CONTRAST:  OMNIPAQUE IOHEXOL 350 MG/ML SOLN  COMPARISON:  None.  FINDINGS: CTA CHEST FINDINGS  The non contrast scan shows no hyperdense crescent or mediastinal hematoma. Trace pericardial effusion. No pleural effusion. Patchy coronary and aortic calcifications.  CTA shows  no aortic dissection, aneurysm, or stenosis. There is bovine variant brachiocephalic arterial origin anatomy without proximal stenosis. There is fair contrast opacification of the central pulmonary artery branches ; the exam was not optimized for detection of pulmonary emboli.  Patchy coarse interstitial opacities bilaterally without confluent airspace disease. Degenerative changes in bilateral shoulders. Exuberant anterior endplate spurs at multiple contiguous levels in the mid thoracic spine. Compression fracture deformity involving superior endplate of T12, with less than 20% loss of height anteriorly, no retropulsion or posterior element involvement.  Review of the MIP images confirms the above findings.  CTA ABDOMEN AND  PELVIS FINDINGS  Arterial findings:  Aorta: Mild scattered atheromatous plaque. No aneurysm, dissection, or stenosis.  Celiac axis: Calcified origin plaque resulting in at least mild short segment stenosis, patent distally.  Superior mesenteric: Calcified origin plaque resulting in mild short segment stenosis, patent distally with classic distal branch anatomy.  Left renal:          Single, patent  Right renal: Single, with nonocclusive partially calcified ostial plaque, patent.  Inferior mesenteric: Patent  Left iliac: Minimal scattered plaque. No aneurysm or stenosis.  Right iliac: Item scattered plaque. No aneurysm or stenosis.  Venous findings: Patent portal vein, SMV, splenic vein, bilateral renal veins.  Review of the MIP images confirms the above findings.  Nonvascular findings: Unremarkable liver, nondilated gallbladder, spleen, adrenal glands. 3.5 cm probable cyst from the upper pole right kidney. No hydronephrosis . Unremarkable pancreas. Small hiatal hernia. Small bowel and colon are nondilated. Multiple descending and sigmoid diverticula without adjacent inflammatory/ edematous change. There is moderate distention of the urinary bladder. No ascites. No free air. Advanced degenerative  disc disease L1- L5 with grade 1 anterolisthesis L4-5 probably secondary to advanced facet DJD at this level. No adenopathy localized.  IMPRESSION: 1. Negative for aortic dissection, aneurysm, or other acute vascular abnormality. 2. Atherosclerosis, including aortoiliac and coronary artery disease. Please note that although the presence of coronary artery calcium documents the presence of coronary artery disease, the severity of this disease and any potential stenosis cannot be assessed on this non-gated CT examination. Assessment for potential risk factor modification, dietary therapy or pharmacologic therapy may be warranted, if clinically indicated. 3. T12 uncomplicated compression fracture deformity, age indeterminate. 4. Descending and sigmoid diverticulosis. 5. Multilevel lumbar spondylitic changes as above.   Electronically Signed   By: Oley Balm [name]   On: 06/12/2014 15:01   Ct Cta Abd/pel W/cm &/or W/o Cm  06/12/2014   CLINICAL DATA:  chest pain radiating to back and legs as well as neck; PAIN  EXAM: CT ANGIOGRAPHY CHEST, ABDOMEN AND PELVIS  TECHNIQUE: Multidetector CT imaging through the chest, abdomen and pelvis was performed using the standard protocol during bolus administration of intravenous contrast. Multiplanar reconstructed images and MIPs were obtained and reviewed to evaluate the vascular anatomy.  CONTRAST:  OMNIPAQUE IOHEXOL 350 MG/ML SOLN  COMPARISON:  None.  FINDINGS: CTA CHEST FINDINGS  The non contrast scan shows no hyperdense crescent or mediastinal hematoma. Trace pericardial effusion. No pleural effusion. Patchy coronary and aortic calcifications.  CTA shows no aortic dissection, aneurysm, or stenosis. There is bovine variant brachiocephalic arterial origin anatomy without proximal stenosis. There is fair contrast opacification of the central pulmonary artery branches ; the exam was not optimized for detection of pulmonary emboli.  Patchy coarse interstitial opacities  bilaterally without confluent airspace disease. Degenerative changes in bilateral shoulders. Exuberant anterior endplate spurs at multiple contiguous levels in the mid thoracic spine. Compression fracture deformity involving superior endplate of T12, with less than 20% loss of height anteriorly, no retropulsion or posterior element involvement.  Review of the MIP images confirms the above findings.  CTA ABDOMEN AND PELVIS FINDINGS  Arterial findings:  Aorta: Mild scattered atheromatous plaque. No aneurysm, dissection, or stenosis.  Celiac axis: Calcified origin plaque resulting in at least mild short segment stenosis, patent distally.  Superior mesenteric: Calcified origin plaque resulting in mild short segment stenosis, patent distally with classic distal branch anatomy.  Left renal:          Single, patent  Right renal:  Single, with nonocclusive partially calcified ostial plaque, patent.  Inferior mesenteric: Patent  Left iliac: Minimal scattered plaque. No aneurysm or stenosis.  Right iliac: Item scattered plaque. No aneurysm or stenosis.  Venous findings: Patent portal vein, SMV, splenic vein, bilateral renal veins.  Review of the MIP images confirms the above findings.  Nonvascular findings: Unremarkable liver, nondilated gallbladder, spleen, adrenal glands. 3.5 cm probable cyst from the upper pole right kidney. No hydronephrosis . Unremarkable pancreas. Small hiatal hernia. Small bowel and colon are nondilated. Multiple descending and sigmoid diverticula without adjacent inflammatory/ edematous change. There is moderate distention of the urinary bladder. No ascites. No free air. Advanced degenerative disc disease L1- L5 with grade 1 anterolisthesis L4-5 probably secondary to advanced facet DJD at this level. No adenopathy localized.  IMPRESSION: 1. Negative for aortic dissection, aneurysm, or other acute vascular abnormality. 2. Atherosclerosis, including aortoiliac and coronary artery disease. Please note  that although the presence of coronary artery calcium documents the presence of coronary artery disease, the severity of this disease and any potential stenosis cannot be assessed on this non-gated CT examination. Assessment for potential risk factor modification, dietary therapy or pharmacologic therapy may be warranted, if clinically indicated. 3. T12 uncomplicated compression fracture deformity, age indeterminate. 4. Descending and sigmoid diverticulosis. 5. Multilevel lumbar spondylitic changes as above.   Electronically Signed   By: Oley Balm [name]   On: 06/12/2014 15:01     EKG Interpretation None     Unable to view ECG through MUSE.   Date: 06/12/2014  Rate: 96  Rhythm: normal sinus rhythm  QRS Axis: normal  Intervals: normal  ST/T Wave abnormalities: normal  Conduction Disutrbances:none  Narrative Interpretation: NSR  Old EKG Reviewed: unchanged    MDM   Final diagnoses:  Bilateral thoracic back pain  Rheumatoid arthritis    80 y.o. female  with pertinent PMH of HTN, DM, prior CVA, CAD, RA presents with back pain between scapula x 1 week as described above.  Patient states the pain is worse when she coughs or takes a deep breath, and she denies trauma. Patient denies fever, however has had a mild cough which was nonproductive. Pain is worse with movement and centered between her scapulae.  Although the patient denies frank chest pain, her pain does radiate over her right anterior chest wall. She denies any rash, or history of zoster and is up-to-date on her zoster immunization.  She is on chronic steroids for her rheumatoid arthritis.  Physical exam as above with midline spinal tenderness, no skin findings, normal cardiovascular exam.  She has equal pulses bilaterally, has good strength in her extremities, and has no sensory deficits.    CT CAP for dissection or aortic pathology demonstrated no obvious etiology of pain.  Pt's pain improved after 1 dose of dilaudid.  No  recurrent pain throughout her stay.  Likely etiology of pain is rheumatoid arthritis. The patient was given strict return precautions for any chest pain, shortness of breath, numbness, tingling, or any other new symptoms. The patient and her family both voiced understanding of precautions and agreed to follow up in 3 days. Also discussed likely etiology and results of CT scan with patient and family.  Discharged home in stable condition to follow up with PCP.  Labs and imaging as above reviewed.   1. Bilateral thoracic back pain   2. Rheumatoid arthritis          Mirian Mo, MD 06/12/14 [phone]

## 2014-06-12 NOTE — ED Notes (Signed)
Per EMS pt from home c/o pain "all over" x 1 week, chronic RA pain aggravated by breathing and movement. Pt reports decreased mobility over past week.

## 2014-06-12 NOTE — ED Notes (Signed)
Patient transported to CT 

## 2014-06-12 NOTE — ED Notes (Signed)
Pt placed on bedpan, pt unable to void at this time.

## 2014-06-12 NOTE — ED Notes (Signed)
Bed: EK80 Expected date:  Expected time:  Means of arrival:  Comments: EMS-pain all over

## 2014-06-12 NOTE — ED Notes (Signed)
Pt. Is aware that we need a urine specimen, but is unable to go at this time. 

## 2014-06-12 NOTE — Telephone Encounter (Signed)
Patient is in severe pain 1-10 (10), Patient is unable to get out of the bed due to the severity of arthritic pain. The family attempted to get patient out of bed several times with no success. [name] family denies that patient is having chest pain. [name] family has called for EMS and was calling the office to inform Dr.Pandey of this.  Dr.Pandey was standing in front of me at the time of call and overheard conversation.

## 2014-06-13 ENCOUNTER — Ambulatory Visit: Payer: Medicare Other | Admitting: Podiatry

## 2014-06-14 ENCOUNTER — Ambulatory Visit (HOSPITAL_COMMUNITY): Payer: Medicare Other

## 2014-06-19 ENCOUNTER — Other Ambulatory Visit: Payer: Self-pay | Admitting: Internal Medicine

## 2014-06-26 ENCOUNTER — Telehealth: Payer: Self-pay | Admitting: *Deleted

## 2014-06-26 NOTE — Telephone Encounter (Signed)
[facility] sent a fax over stating that patient's BP was 175/91 on 06/22/2014 at 11:30-11:45am. I called patient and she stated that her aid takes it everyday. She stated that it has been running good ever since. Patient stated that she took a Clonidine and she was fine an hour later.  Please Advise.

## 2014-06-26 NOTE — Telephone Encounter (Signed)
Patient Aware -

## 2014-06-26 NOTE — Telephone Encounter (Signed)
If her blood pressure returned to normal after taking medication and has been under good control, continue current regimen and continue to monitor bp

## 2014-06-27 ENCOUNTER — Ambulatory Visit (INDEPENDENT_AMBULATORY_CARE_PROVIDER_SITE_OTHER): Payer: Medicare Other | Admitting: Internal Medicine

## 2014-06-27 ENCOUNTER — Encounter: Payer: Self-pay | Admitting: Internal Medicine

## 2014-06-27 VITALS — BP 158/72 | HR 96 | Temp 98.0°F | Resp 18 | Ht 64.0 in | Wt 195.6 lb

## 2014-06-27 DIAGNOSIS — M069 Rheumatoid arthritis, unspecified: Secondary | ICD-10-CM

## 2014-06-27 DIAGNOSIS — M545 Low back pain, unspecified: Secondary | ICD-10-CM

## 2014-06-27 DIAGNOSIS — I2510 Atherosclerotic heart disease of native coronary artery without angina pectoris: Secondary | ICD-10-CM

## 2014-06-27 HISTORY — DX: Low back pain, unspecified: M54.50

## 2014-06-27 MED ORDER — METHOTREXATE (ANTI-RHEUMATIC) 2.5 MG PO TABS: ORAL_TABLET | ORAL | Status: DC

## 2014-06-27 MED ORDER — TRAMADOL HCL 50 MG PO TABS: 50.0000 mg | ORAL_TABLET | Freq: Four times a day (QID) | ORAL | Status: DC | PRN

## 2014-06-27 NOTE — Progress Notes (Signed)
Patient ID: [name] [name], female   DOB: 03/22/1930, 80 y.o.   MRN: [id number]    Chief Complaint  Patient presents with  . Hospitalization Follow-up   Allergies  Allergen Reactions  . Cetirizine Hcl Other (See Comments)    unknown  . Codeine Other (See Comments)    unknown  . Fish Allergy   . Indomethacin Other (See Comments)    unknown  . Lyrica [Pregabalin]     hallucinations  . Methyldopa Other (See Comments)    unknown  . Shellfish Allergy Hives  . Tomato Rash   HPI 80 y/o female patient is here for follow up post ED visit on 06/12/14 with back pain. Her workup for new fracture and aneurysm/ dissection were negative. She has been seen by rheumatology and started on methotrexate and tapering course of prednisone. Her pain is under control with this and prn tramadol. No concerns this visit. She needs refill on tramadol She has history HTN, DM, prior CVA, CAD, RA   ROS No fever or chills Denies swelling or redness of her joint No chest pain or dyspnea appetite is good No falls reported  Past Medical History  Diagnosis Date  . Hypertension   . First degree atrioventricular block   . Onychia and paronychia of toe   . Muscle weakness (generalized)   . Allergic rhinitis due to pollen 04/27/2007  . Gout, unspecified 04/19/2013  . Disorder of bone and cartilage, unspecified   . Edema 05/03/2013  . Hyperlipidemia 04/27/2007  . Unspecified essential hypertension 08/08/2013  . Coronary atherosclerosis of native coronary artery   . Other malaise and fatigue   . Cough   . Other fall   . Unspecified vitamin D deficiency   . TIA (transient ischemic attack)     "a few one summer" (08/07/2013)  . Myocardial infarction ~ 1970  . Exertional shortness of breath     "sometimes" (08/07/2013)  . Type II diabetes mellitus     "fasting 90-110s" (08/07/2013)  . Osteoarthrosis, unspecified whether generalized or localized, unspecified site 04/27/2007  . Prepatellar bursitis   .  Arthritis     "in q joint" (08/07/2013)  . Stroke 01/17/2014  . Seizures   . Muscle spasm    Current Outpatient Prescriptions on File Prior to Visit  Medication Sig Dispense Refill  . acetaminophen (TYLENOL) 500 MG tablet Take 500 mg by mouth every 6 (six) hours as needed for moderate pain.      Marland Kitchen albuterol (PROVENTIL HFA;VENTOLIN HFA) 108 (90 BASE) MCG/ACT inhaler Inhale 2 puffs into the lungs every 6 (six) hours as needed for wheezing.  1 Inhaler  0  . allopurinol (ZYLOPRIM) 100 MG tablet Take 100 mg by mouth daily.      Marland Kitchen aspirin 81 MG tablet Take 81 mg by mouth daily.      . calcium-vitamin D (OSCAL WITH D) 500-200 MG-UNIT per tablet Take 1 tablet by mouth 2 (two) times daily.      . cholecalciferol (VITAMIN D) 1000 UNITS tablet Take 1,000 Units by mouth every other day.      Marland Kitchen glucose blood (ONE TOUCH ULTRA TEST) test strip Check blood sugar once daily DX 250.00  100 each  3  . hydrALAZINE (APRESOLINE) 25 MG tablet Take 1 tablet (25 mg total) by mouth 4 (four) times daily. For HTN  120 tablet  3  . labetalol (NORMODYNE) 100 MG tablet Take 2 tablets (200 mg total) by mouth 2 (two) times daily.  120  tablet  1  . lisinopril (PRINIVIL,ZESTRIL) 5 MG tablet Take one tablet by mouth once daily to protect kidneys  30 tablet  3  . metFORMIN (GLUCOPHAGE) 500 MG tablet Take 500 mg by mouth daily with breakfast. For diabetes      . methocarbamol (ROBAXIN) 500 MG tablet Take one tablet by mouth every 8 hours as needed for muscle spasms.  15 tablet  0  . polyvinyl alcohol (LIQUIFILM TEARS) 1.4 % ophthalmic solution Place 1 drop into both eyes as needed (for dry eyes).      . predniSONE (DELTASONE) 5 MG tablet Take 1 tablet (5 mg total) by mouth daily before breakfast.  31 tablet  0  . triamcinolone cream (KENALOG) 0.1 % Apply 1 application topically 2 (two) times daily. For affected area      . VOLTAREN 1 % GEL APPLY TOPICALLY 3 TO 4 TIMES DAILY.  100 g  3   No current facility-administered  medications on file prior to visit.   Physical exam BP 158/72  Pulse 96  Temp(Src) 98 F (36.7 C) (Oral)  Resp 18  Ht 5\' 4"  (1.626 m)  Wt 195 lb 9.6 oz (88.724 kg)  BMI 33.56 kg/m2  SpO2 97%  General- elderly female in no acute distress, obese Head- atraumatic, normocephalic Eyes- PERRLA, EOMI, no pallor, no icterus, no discharge Neck- no lymphadenopathy, no thyromegaly Cardiovascular- normal s1,s2, no murmurs/ rubs/ gallops, normal distal pulses Respiratory- bilateral clear to auscultation, no wheeze, no rhonchi, no crackles Abdomen- bowel sounds present, soft, non tender Musculoskeletal- able to move all 4 extremities, foot and ankle edema present. Limited ROM, on wheelchair Neurological- no focal deficit, normal foot exam with good dorsalis pedis, normal microfilament and vibration test Skin- warm and dry Psychiatry- alert and oriented to person, place and time, normal mood and affect  Labs Lab Results  Component Value Date   HGBA1C 6.9* 05/01/2014   Lab Results  Component Value Date   TSH 2.510 05/01/2014   Assessment/plan  1. Midline low back pain without sciatica Pain under control, continue prednisone and her prn tramadol  2. Rheumatoid arthritis Seeing her rheumatologist, will request for records. Continue MTX, folic acid and tapering course of prednisone

## 2014-06-28 ENCOUNTER — Telehealth: Payer: Self-pay | Admitting: *Deleted

## 2014-06-28 ENCOUNTER — Ambulatory Visit (HOSPITAL_COMMUNITY)
Admission: RE | Admit: 2014-06-28 | Discharge: 2014-06-28 | Disposition: A | Discharge status: Home / Self Care | Payer: Medicare Other | Source: Ambulatory Visit | Attending: Internal Medicine | Admitting: Internal Medicine

## 2014-06-28 ENCOUNTER — Ambulatory Visit (HOSPITAL_COMMUNITY): Payer: Medicare Other

## 2014-06-28 DIAGNOSIS — Z13820 Encounter for screening for osteoporosis: Secondary | ICD-10-CM | POA: Diagnosis not present

## 2014-06-28 DIAGNOSIS — Z780 Asymptomatic menopausal state: Secondary | ICD-10-CM | POA: Insufficient documentation

## 2014-06-28 DIAGNOSIS — IMO0002 Reserved for concepts with insufficient information to code with codable children: Secondary | ICD-10-CM

## 2014-06-28 LAB — HM DEXA SCAN

## 2014-06-28 NOTE — Telephone Encounter (Signed)
Daughter, Lowella Curb and just wanted to let you know that the Rheumatologist diagnosed her mother as Anemic. They forgot to tell you yesterday at the appointment.

## 2014-06-28 NOTE — Telephone Encounter (Signed)
Noted. Thanks. Continue the folic acid supplement for now

## 2014-06-29 NOTE — Telephone Encounter (Signed)
I Received Labwork from Dr. Corliss Skains [facility] and the Hemoglobin is :  10.7 on 06/13/2014 9.6  On 06/28/2014  Patient has an appointment with you on Tuesday. Please Advise.

## 2014-06-29 NOTE — Telephone Encounter (Signed)
Rheumatologist yesterday at appointment stated that patients anemia is getting worse and told her to follow up with Primary. Scheduled patient an appointment to follow up with you regarding this on Tuesday. Also told her to call the Dr. To fax the labwork. She agreed.

## 2014-06-29 NOTE — Telephone Encounter (Signed)
Spoke with Dr. Glade Lloyd, patient is to watch for any blood in the urine or stool. Jeanette Notified and Caregiver will stay with her 24/7 and will follow up on Tuesday.

## 2014-06-29 NOTE — Telephone Encounter (Signed)
Patient to be monitored clinically for now MCV 85. Will see her in clinic on Tuesday for anemia workup. No change in meds for now, to call if notices blood in urine or stool, black stool or vomits blood.

## 2014-07-03 ENCOUNTER — Encounter: Payer: Self-pay | Admitting: *Deleted

## 2014-07-03 ENCOUNTER — Ambulatory Visit (INDEPENDENT_AMBULATORY_CARE_PROVIDER_SITE_OTHER): Payer: Medicare Other | Admitting: Internal Medicine

## 2014-07-03 ENCOUNTER — Encounter: Payer: Self-pay | Admitting: Internal Medicine

## 2014-07-03 VITALS — BP 150/80 | HR 86 | Temp 98.3°F | Wt 195.6 lb

## 2014-07-03 DIAGNOSIS — D649 Anemia, unspecified: Secondary | ICD-10-CM

## 2014-07-03 DIAGNOSIS — I2510 Atherosclerotic heart disease of native coronary artery without angina pectoris: Secondary | ICD-10-CM

## 2014-07-03 DIAGNOSIS — Z79631 Long term (current) use of antimetabolite agent: Secondary | ICD-10-CM

## 2014-07-03 DIAGNOSIS — Z79899 Other long term (current) drug therapy: Secondary | ICD-10-CM

## 2014-07-03 NOTE — Progress Notes (Signed)
Patient ID: [name] [name], female   DOB: June 26, 1930, 79 y.o.   MRN: [id number]    Chief Complaint  Patient presents with  . Follow-up    f/u anemia and Rhematologist  . other    fall screening Neg., had an episode of SOB on Sunday morning    Allergies  Allergen Reactions  . Cetirizine Hcl Other (See Comments)    unknown  . Codeine Other (See Comments)    unknown  . Fish Allergy   . Indomethacin Other (See Comments)    unknown  . Lyrica [Pregabalin]     hallucinations  . Methyldopa Other (See Comments)    unknown  . Shellfish Allergy Hives  . Tomato Rash   HPI 80 y/o female pt with RA recently started on methotrexate and folic acid is here for follow up on her abnormal workup with rheumatology. She was noted to have drop in her Hb/Hct in lab work from hospital. She feels ok with her energy level. She does complaint of an episode of SOB on Sunday after exerting herself a little more than usual. She continues to work with therapy  ROS Denies chest pain Joint pain present but better controlled Mood has been ok Denies nausea or vomiting No hemoptysis, hematemesis or melena or rectal bleed  Medication reviewed. See [facility]  Physical exam BP 150/80  Pulse 86  Temp(Src) 98.3 F (36.8 C) (Oral)  Wt 195 lb 9.6 oz (88.724 kg)  SpO2 96%  General- elderly female in no acute distress, obese Head- atraumatic, normocephalic Eyes- PERRLA, EOMI, no pallor, no icterus, no discharge Neck- no lymphadenopathy, no thyromegaly Cardiovascular- normal s1,s2, no murmurs/ rubs/ gallops, normal distal pulses Respiratory- bilateral clear to auscultation, no wheeze, no rhonchi, no crackles Abdomen- bowel sounds present, soft, non tender Musculoskeletal- able to move all 4 extremities Skin- warm and dry  Labs- 06/12/14 hb 9.9, hct 30.2, mcv 86.3  Assessment/plan  1. Anemia, unspecified anemia type Workup for anemia, has normocytic anemia. Rule out iron def vs anemia of chronic dis and since  been started on methotrexate, rule out folic acid and b12 def as well - CBC with Differential - Folate - Vitamin B12 - Methylmalonic Acid, Serum - Ferritin - Iron and TIBC  2. On methotrexate therapy See above - Folate - Vitamin B12

## 2014-07-04 ENCOUNTER — Encounter: Payer: Self-pay | Admitting: *Deleted

## 2014-07-06 LAB — CBC WITH DIFFERENTIAL/PLATELET
Basophils Absolute: 0 10*3/uL (ref 0.0–0.2)
Basos: 0 %
Eos: 1 %
Eosinophils Absolute: 0.1 10*3/uL (ref 0.0–0.4)
HCT: 30.5 % — ABNORMAL LOW (ref 34.0–46.6)
Hemoglobin: 9.8 g/dL — ABNORMAL LOW (ref 11.1–15.9)
Immature Grans (Abs): 0 10*3/uL (ref 0.0–0.1)
Immature Granulocytes: 0 %
Lymphocytes Absolute: 1.4 10*3/uL (ref 0.7–3.1)
Lymphs: 14 %
MCH: 27.9 pg (ref 26.6–33.0)
MCHC: 32.1 g/dL (ref 31.5–35.7)
MCV: 87 fL (ref 79–97)
Monocytes Absolute: 0.2 10*3/uL (ref 0.1–0.9)
Monocytes: 2 %
Neutrophils Absolute: 8.1 10*3/uL — ABNORMAL HIGH (ref 1.4–7.0)
Neutrophils Relative %: 83 %
RBC: 3.51 x10E6/uL — ABNORMAL LOW (ref 3.77–5.28)
RDW: 17.6 % — ABNORMAL HIGH (ref 12.3–15.4)
WBC: 9.9 10*3/uL (ref 3.4–10.8)

## 2014-07-06 LAB — IRON AND TIBC
Iron Saturation: 9 % — CL (ref 15–55)
Iron: 25 ug/dL — ABNORMAL LOW (ref 35–155)
TIBC: 286 ug/dL (ref 250–450)
UIBC: 261 ug/dL (ref 150–375)

## 2014-07-06 LAB — FOLATE: Folate: >19.9 ng/mL (ref >3.0)

## 2014-07-06 LAB — FERRITIN: Ferritin: 176 ng/mL — ABNORMAL HIGH (ref 15–150)

## 2014-07-06 LAB — METHYLMALONIC ACID, SERUM: Methylmalonic Acid: 208 nmol/L (ref 0–378)

## 2014-07-06 LAB — VITAMIN B12: Vitamin B-12: 352 pg/mL (ref 211–946)

## 2014-07-10 ENCOUNTER — Ambulatory Visit: Payer: Medicare Other | Admitting: Internal Medicine

## 2014-07-12 ENCOUNTER — Telehealth: Payer: Self-pay | Admitting: *Deleted

## 2014-07-12 MED ORDER — FERROUS SULFATE 325 (65 FE) MG PO TABS: ORAL_TABLET | ORAL | Status: DC

## 2014-07-12 MED ORDER — SENNA 8.6 MG PO TABS: ORAL_TABLET | ORAL | Status: DC

## 2014-07-12 NOTE — Telephone Encounter (Signed)
Patient daughter, Para March called and stated that she never received the medication for her anemia in the mail. Explained to her that we do not mail medications and we do not have samples of the ferrous sulfate. Told her that she can get this over the counter and to also take the senna s for constipation. She agreed and will pick up at drugstore today. Added the medication into patient's medication list.

## 2014-07-20 ENCOUNTER — Telehealth: Payer: Self-pay | Admitting: *Deleted

## 2014-07-20 NOTE — Telephone Encounter (Signed)
[facility] Faxed [place] BP Readings for patient. # [phone] Fax # [phone]  06/27/14--175/95  06/28/14--177/95  06/29/14--191/104  07/02/14--170/93  07/04/14--184/91  07/20/14--171/90

## 2014-07-23 MED ORDER — HYDRALAZINE HCL 50 MG PO TABS: ORAL_TABLET | ORAL | Status: DC

## 2014-07-23 NOTE — Telephone Encounter (Signed)
Daughter and [facility] Notified and Rx called into pharmacy.

## 2014-07-23 NOTE — Telephone Encounter (Signed)
Your blood pressure has been high. Will change hydralazine to 50 mg three times a day from 25 mg four times a day.

## 2014-07-24 ENCOUNTER — Other Ambulatory Visit: Payer: Self-pay | Admitting: Internal Medicine

## 2014-07-24 ENCOUNTER — Telehealth: Payer: Self-pay | Admitting: *Deleted

## 2014-07-24 NOTE — Telephone Encounter (Signed)
[name] [name] with [facility] # [phone] called and stated that patient's BP reading yesterday was 157/104 and today 198/118. I spoke with Dr. Glade Lloyd regarding this and she wanted to know if patient is taking the Hydralazine 50mg  tid she changed yesterday and taking her Clonidine prn? Patient stated that she received the new Rx for Hydralazine yesterday and will take it tid. And she is taking the Clonidine PRN. And she stated that her daughter retook her BP and it has came down to 139/63.  Dr. told me to tell her to continue the Hydralazine as directed and the Clonidine prn. Patient agreed.

## 2014-07-27 ENCOUNTER — Telehealth: Payer: Self-pay | Admitting: *Deleted

## 2014-07-27 MED ORDER — HYDRALAZINE HCL 50 MG PO TABS: ORAL_TABLET | ORAL | Status: DC

## 2014-07-27 NOTE — Progress Notes (Signed)
This encounter was created in error - please disregard.

## 2014-07-27 NOTE — Telephone Encounter (Signed)
dont stop the hydralazine. i want her to take it. She can take one tablet in the morning and one in evening to prevent sudden rise of blood pressure. Also check bp twice a day and call office back on monday

## 2014-07-27 NOTE — Telephone Encounter (Signed)
Patient daughter, Para March, Notified and agreed. Directions changed

## 2014-07-27 NOTE — Telephone Encounter (Signed)
She should not stop hydralazine all of a sudden. She was on hydralazine 50 mg three times a day and this was increased to 75 mg three times a day correct? If yes, i want her to continue her previous 50 mg tid. Let me know

## 2014-07-27 NOTE — Telephone Encounter (Signed)
Para March called and stated that patient stopped the Hydralazine due to it making her dizzy and wanted to let you know. Patient has also had numbness in her hands. Please Advise.

## 2014-07-27 NOTE — Telephone Encounter (Signed)
Patient states that she was only taking Hydralazine 50mg  tid and stopped it yesterday. Please advise.

## 2014-07-31 ENCOUNTER — Other Ambulatory Visit: Payer: Self-pay | Admitting: *Deleted

## 2014-07-31 ENCOUNTER — Telehealth: Payer: Self-pay | Admitting: *Deleted

## 2014-07-31 MED ORDER — LABETALOL HCL 100 MG PO TABS: 200.0000 mg | ORAL_TABLET | Freq: Two times a day (BID) | ORAL | Status: DC

## 2014-07-31 NOTE — Telephone Encounter (Signed)
Christina with [facility] called and stated that patient's BP has been running on the high side. Waiting for her to return my call with the readings. Her phone keeps cutting off.

## 2014-07-31 NOTE — Telephone Encounter (Signed)
[facility] 

## 2014-08-01 NOTE — Telephone Encounter (Signed)
[name] [name] with [facility] called back and stated that patient's BP Saturday was 140/90. Sunday am 184/107 pm 174/108. Monday 135/109. And Tuesday 143/87. Please Advise.

## 2014-08-01 NOTE — Telephone Encounter (Signed)
The patient and her daughter need to be consistent in giving the medication. Confirm with patient and her daughter on what med pt is currently taking. With frequent self change of med it will be difficult to obtain desired goal. Send me current bp med and dosing that pt is on and will adjust it

## 2014-08-02 ENCOUNTER — Telehealth: Payer: Self-pay | Admitting: *Deleted

## 2014-08-02 NOTE — Telephone Encounter (Signed)
Spoke with Para March (daughter).Patient is taking-  Hydralazine 50mg  twice daily and taking Clonidine if elevated (gave Sunday). 07/29/2014--   164/105 am    184/107      17 4/108 ---Called Dr. told patient to continue with Hydralazine 50mg  twice daily 07/30/2014--   135/119 am 07/31/2014--   142/87 08/01/2014--   145/87

## 2014-08-02 NOTE — Telephone Encounter (Signed)
Patient Notified

## 2014-08-02 NOTE — Telephone Encounter (Signed)
bp looks better. Continue hydralazine 50 mg bid and clonidine as advised. Continue to monitor bp readings

## 2014-08-02 NOTE — Telephone Encounter (Signed)
Patient Notified and agreed. 

## 2014-08-02 NOTE — Telephone Encounter (Signed)
Ok to take robitussin. Do not take robitussin DM

## 2014-08-02 NOTE — Telephone Encounter (Signed)
Daughter Para March called and stated that her mother is having Congestion and would like to know what is safe with her BP to take OTC. Please Advise.

## 2014-08-07 ENCOUNTER — Ambulatory Visit: Payer: Medicare Other | Admitting: Podiatry

## 2014-08-10 ENCOUNTER — Ambulatory Visit: Payer: Medicare Other | Admitting: Podiatry

## 2014-08-13 ENCOUNTER — Telehealth: Payer: Self-pay | Admitting: *Deleted

## 2014-08-13 NOTE — Telephone Encounter (Signed)
[facility] # [phone]  Fax# [phone]-- faxed a BP Reading  BP 158/97 taken 08/13/2014

## 2014-08-14 NOTE — Telephone Encounter (Signed)
[facility] Notified.

## 2014-08-14 NOTE — Telephone Encounter (Signed)
Continue bp check and send in a week's reading for review pls

## 2014-08-18 ENCOUNTER — Other Ambulatory Visit: Payer: Self-pay | Admitting: Internal Medicine

## 2014-08-22 ENCOUNTER — Encounter: Payer: Self-pay | Admitting: *Deleted

## 2014-08-22 ENCOUNTER — Telehealth: Payer: Self-pay | Admitting: *Deleted

## 2014-08-22 NOTE — Telephone Encounter (Signed)
Spoke with patient regarding medication changes to clonidine 0.1 mg to be taken daily per Dr. Glade Lloyd. [name] [name] was given a appointment to come see Dr. Glade Lloyd on 08/28/2014 @ 4:15pm per Dr. Glade Lloyd.

## 2014-08-28 ENCOUNTER — Telehealth: Payer: Self-pay | Admitting: *Deleted

## 2014-08-28 ENCOUNTER — Other Ambulatory Visit: Payer: Self-pay | Admitting: Internal Medicine

## 2014-08-28 ENCOUNTER — Other Ambulatory Visit: Payer: Self-pay | Admitting: *Deleted

## 2014-08-28 ENCOUNTER — Ambulatory Visit: Payer: Self-pay | Admitting: Internal Medicine

## 2014-08-28 MED ORDER — HYDRALAZINE HCL 50 MG PO TABS: ORAL_TABLET | ORAL | Status: DC

## 2014-08-28 NOTE — Telephone Encounter (Signed)
Patient came for a BP check- BP 180/100. Spoke with Dr.Pandey regarding patient's BP, she stated that to change hydralazine HCI to three times daily with Bp checked daily (log). Patient is to come by to the office in 2 weeks for a follow up.

## 2014-09-05 ENCOUNTER — Telehealth: Payer: Self-pay | Admitting: *Deleted

## 2014-09-05 NOTE — Telephone Encounter (Signed)
Make appointment for doctor visit for BP check tomorrow or Friday to address her bp readings in office

## 2014-09-05 NOTE — Telephone Encounter (Signed)
Received fax from [facility] with readings from 09/05/2014 at 11:21am. Please Advise. # [phone] Fax: [phone]  BS:  168  BP:  163/94

## 2014-09-06 ENCOUNTER — Ambulatory Visit: Payer: Medicare Other | Admitting: Internal Medicine

## 2014-09-06 NOTE — Telephone Encounter (Signed)
Appointment made today with Dr. Renato Gails at 1:45 and patient Notified and agreed.

## 2014-09-11 ENCOUNTER — Ambulatory Visit (INDEPENDENT_AMBULATORY_CARE_PROVIDER_SITE_OTHER): Payer: Medicare Other | Admitting: Internal Medicine

## 2014-09-11 ENCOUNTER — Ambulatory Visit (INDEPENDENT_AMBULATORY_CARE_PROVIDER_SITE_OTHER): Payer: Medicare Other

## 2014-09-11 ENCOUNTER — Encounter: Payer: Self-pay | Admitting: Internal Medicine

## 2014-09-11 VITALS — BP 148/80 | HR 95 | Temp 97.0°F | Resp 10 | Ht 64.0 in | Wt 191.0 lb

## 2014-09-11 DIAGNOSIS — Z23 Encounter for immunization: Secondary | ICD-10-CM

## 2014-09-11 DIAGNOSIS — I10 Essential (primary) hypertension: Secondary | ICD-10-CM

## 2014-09-11 DIAGNOSIS — I2510 Atherosclerotic heart disease of native coronary artery without angina pectoris: Secondary | ICD-10-CM

## 2014-09-11 LAB — HEPATIC FUNCTION PANEL
ALT: 8 U/L (ref 7–35)
AST: 11 U/L — AB (ref 13–35)
Alkaline Phosphatase: 57 U/L (ref 25–125)
Bilirubin, Total: 0.3 mg/dL

## 2014-09-11 LAB — BASIC METABOLIC PANEL
BUN: 16 mg/dL (ref 4–21)
Creatinine: 0.7 mg/dL (ref 0.5–1.1)
Glucose: 118 mg/dL
Potassium: 4.5 mmol/L (ref 3.4–5.3)
Sodium: 138 mmol/L (ref 137–147)

## 2014-09-11 LAB — HM DIABETES EYE EXAM

## 2014-09-11 LAB — CBC AND DIFFERENTIAL
HCT: 32 % — AB (ref 36–46)
Hemoglobin: 10.1 g/dL — AB (ref 12.0–16.0)
Platelets: 386 10*3/uL (ref 150–399)
WBC: 7.9 10^3/mL

## 2014-09-11 NOTE — Progress Notes (Signed)
Patient ID: [name] [name], female   DOB: Jul 10, 1930, 80 y.o.   MRN: [id number]    Chief Complaint  Patient presents with  . Follow-up    B/P follow-up    Allergies  Allergen Reactions  . Cetirizine Hcl Other (See Comments)    unknown  . Codeine Other (See Comments)    unknown  . Fish Allergy   . Indomethacin Other (See Comments)    unknown  . Lyrica [Pregabalin]     hallucinations  . Methyldopa Other (See Comments)    unknown  . Shellfish Allergy Hives  . Tomato Rash   HPI 80 y/o female patient is here for follow up on her bp readings. Her bp had been elevated at home with readings SBP 150-180 and dbp 90-100. Her hydralazine was increased to 50 mg tid and now bp today is stable 148/80. Patient mentions that most of her blood pressure reading check by home health aid are post exercise/ activities. She mentions that her bp after resting for a couple of minutes normalizes and are < 150/90 these days  ROS Reviewed meds, taking them as prescribed Denies chest pain or dyspnea Denies nausea or vomiting or abdominal pain Denies headache  Past Medical History  Diagnosis Date  . Hypertension   . First degree atrioventricular block   . Onychia and paronychia of toe   . Muscle weakness (generalized)   . Allergic rhinitis due to pollen 04/27/2007  . Gout, unspecified 04/19/2013  . Disorder of bone and cartilage, unspecified   . Edema 05/03/2013  . Hyperlipidemia 04/27/2007  . Unspecified essential hypertension 08/08/2013  . Coronary atherosclerosis of native coronary artery   . Other malaise and fatigue   . Cough   . Other fall   . Unspecified vitamin D deficiency   . TIA (transient ischemic attack)     "a few one summer" (08/07/2013)  . Myocardial infarction ~ 1970  . Exertional shortness of breath     "sometimes" (08/07/2013)  . Type II diabetes mellitus     "fasting 90-110s" (08/07/2013)  . Osteoarthrosis, unspecified whether generalized or localized, unspecified site  04/27/2007  . Prepatellar bursitis   . Arthritis     "in q joint" (08/07/2013)  . Stroke 01/17/2014  . Seizures   . Muscle spasm    Current Outpatient Prescriptions on File Prior to Visit  Medication Sig Dispense Refill  . acetaminophen (TYLENOL) 500 MG tablet Take 500 mg by mouth every 6 (six) hours as needed for moderate pain.    Marland Kitchen albuterol (PROVENTIL HFA;VENTOLIN HFA) 108 (90 BASE) MCG/ACT inhaler Inhale 2 puffs into the lungs every 6 (six) hours as needed for wheezing. 1 Inhaler 0  . allopurinol (ZYLOPRIM) 100 MG tablet TAKE 1 TABLET BY MOUTH EVERY DAY FOR GOUT 30 tablet 1  . aspirin 81 MG tablet Take 81 mg by mouth daily.    . calcium-vitamin D (OSCAL WITH D) 500-200 MG-UNIT per tablet Take 1 tablet by mouth 2 (two) times daily.    . cholecalciferol (VITAMIN D) 1000 UNITS tablet Take 1,000 Units by mouth every other day.    . cloNIDine (CATAPRES) 0.1 MG tablet Take 0.1 mg by mouth daily. For SBP>160    . ferrous sulfate 325 (65 FE) MG tablet Take one tablet by mouth three times daily for anemia 90 tablet 3  . folic acid (FOLVITE) 1 MG tablet Take 12 mg by mouth daily.    Marland Kitchen glucose blood (ONE TOUCH ULTRA TEST) test strip Check  blood sugar once daily DX 250.00 100 each 3  . hydrALAZINE (APRESOLINE) 50 MG tablet Take one tablet by mouth three times daily  to control blood pressure. 90 tablet 2  . labetalol (NORMODYNE) 100 MG tablet Take 2 tablets (200 mg total) by mouth 2 (two) times daily. 120 tablet 1  . lisinopril (PRINIVIL,ZESTRIL) 5 MG tablet Take one tablet by mouth once daily to protect kidneys 30 tablet 3  . metFORMIN (GLUCOPHAGE) 500 MG tablet TAKE 1 TABLET BY MOUTH EVERY DAY FOR DIABETES 30 tablet 4  . methocarbamol (ROBAXIN) 500 MG tablet Take one tablet by mouth every 8 hours as needed for muscle spasms. 15 tablet 0  . methotrexate (RHEUMATREX) 2.5 MG tablet Provided by rheumatology- started on 4 tablets every week for 2 weeks, then 6 tab every week for 2 weeks, then 8 tab  every week 12 tablet 0  . polyvinyl alcohol (LIQUIFILM TEARS) 1.4 % ophthalmic solution Place 1 drop into both eyes as needed (for dry eyes).    . predniSONE (DELTASONE) 5 MG tablet Take 1 tablet (5 mg total) by mouth daily before breakfast. 31 tablet 0  . senna (SENOKOT) 8.6 MG TABS tablet Take one tablet by mouth once daily for constipation 30 each 0  . traMADol (ULTRAM) 50 MG tablet take 1-2 tablets by mouth every 6 hours if needed for MODERATE PAIN 120 tablet 0  . triamcinolone cream (KENALOG) 0.1 % Apply 1 application topically 2 (two) times daily. For affected area    . triamcinolone cream (KENALOG) 0.1 % APPLY TO AFFECTED AREA TWICE DAILY AS NEEDED 30 g 1  . triamcinolone cream (KENALOG) 0.1 % APPLY TO AFFECTED AREA TWICE DAILY AS NEEDED 30 g 2  . vitamin C (ASCORBIC ACID) 500 MG tablet Take 500 mg by mouth daily.    . VOLTAREN 1 % GEL APPLY TOPICALLY 3 TO 4 TIMES DAILY. 100 g 3   No current facility-administered medications on file prior to visit.   Physical exam BP 148/80 mmHg  Pulse 95  Temp(Src) 97 F (36.1 C) (Oral)  Resp 10  Ht 5\' 4"  (1.626 m)  Wt 191 lb (86.637 kg)  BMI 32.77 kg/m2  SpO2 95%  General- elderly female in no acute distress, obese Head- atraumatic, normocephalic Eyes- PERRLA, EOMI, no pallor, no icterus, no discharge Neck- no lymphadenopathy, no thyromegaly Cardiovascular- normal s1,s2, no murmurs/ rubs/ gallops Respiratory- bilateral clear to auscultation, no wheeze, no rhonchi, no crackles Abdomen- bowel sounds present, soft, non tender Musculoskeletal- able to move all 4 extremities  Assessment/plan  HTN bp better controlled. Continue labetalol 200 mg bid, lisinopril 5 mg daily, hydralazine 50 mg tid and prn clonidine. Monitor bp 3 days a week. Advised on bp check by [facility] after 10-15 min of resting only and to give clonidine only if bp > 160/90

## 2014-09-12 ENCOUNTER — Encounter: Payer: Self-pay | Admitting: *Deleted

## 2014-09-19 ENCOUNTER — Encounter: Payer: Self-pay | Admitting: Internal Medicine

## 2014-09-26 ENCOUNTER — Encounter: Payer: Self-pay | Admitting: Internal Medicine

## 2014-09-26 ENCOUNTER — Ambulatory Visit (INDEPENDENT_AMBULATORY_CARE_PROVIDER_SITE_OTHER): Payer: Medicare Other | Admitting: Internal Medicine

## 2014-09-26 VITALS — BP 140/90 | HR 114 | Temp 98.6°F | Ht 64.0 in | Wt 189.0 lb

## 2014-09-26 DIAGNOSIS — I10 Essential (primary) hypertension: Secondary | ICD-10-CM

## 2014-09-26 DIAGNOSIS — I2510 Atherosclerotic heart disease of native coronary artery without angina pectoris: Secondary | ICD-10-CM

## 2014-09-26 DIAGNOSIS — J302 Other seasonal allergic rhinitis: Secondary | ICD-10-CM

## 2014-09-26 DIAGNOSIS — E1129 Type 2 diabetes mellitus with other diabetic kidney complication: Secondary | ICD-10-CM

## 2014-09-26 DIAGNOSIS — D638 Anemia in other chronic diseases classified elsewhere: Secondary | ICD-10-CM

## 2014-09-26 DIAGNOSIS — M069 Rheumatoid arthritis, unspecified: Secondary | ICD-10-CM

## 2014-09-26 MED ORDER — HYDRALAZINE HCL 50 MG PO TABS: ORAL_TABLET | ORAL | Status: DC

## 2014-09-26 NOTE — Progress Notes (Signed)
Patient ID: [name] [name], female   DOB: 02-21-1930, 80 y.o.   MRN: [id number]    Chief Complaint  Patient presents with  . Follow-up    3 month follow up   Allergies  Allergen Reactions  . Cetirizine Hcl Other (See Comments)    unknown  . Codeine Other (See Comments)    unknown  . Fish Allergy   . Indomethacin Other (See Comments)    unknown  . Lyrica [Pregabalin]     hallucinations  . Methyldopa Other (See Comments)    unknown  . Shellfish Allergy Hives  . Tomato Rash   HPI 80 y/o here for RV. Reviewed home bp and cbg reading. Taking her meds as prescribed. Has sniffles with dry cough and scratchy throat for 2-3 days.  Reviewed home bp readings 109-158/ 67-85 with one in 96. cbg reviewed 124-154 with one reading of 256  Review of Systems   Constitutional: Negative for fever, chills, diaphoresis.   HENT: Negative for congestion, hearing loss and sore throat.    Eyes: Negative for blurred vision, double vision and discharge.   Respiratory: Negative for sputum production, shortness of breath and wheezing.    Cardiovascular: Negative for chest pain, palpitations, orthopnea. Trace swelling  Gastrointestinal: Negative for heartburn, nausea, vomiting, abdominal pain, diarrhea and constipation.   Genitourinary: Negative for dysuria, urgency, frequency and flank pain.   Musculoskeletal: has joint pain and low back pain better controlled. No falls reported. Has RA and OA Skin: Negative for itching and rash.   Neurological: Negative for dizziness, tingling, focal weakness and headaches.   Psychiatric/Behavioral: Negative for depression and memory loss. The patient is not nervous/anxious.  Past Medical History  Diagnosis Date  . Hypertension   . First degree atrioventricular block   . Onychia and paronychia of toe   . Muscle weakness (generalized)   . Allergic rhinitis due to pollen 04/27/2007  . Gout, unspecified 04/19/2013  . Disorder of bone and cartilage, unspecified   .  Edema 05/03/2013  . Hyperlipidemia 04/27/2007  . Unspecified essential hypertension 08/08/2013  . Coronary atherosclerosis of native coronary artery   . Other malaise and fatigue   . Cough   . Other fall   . Unspecified vitamin D deficiency   . TIA (transient ischemic attack)     "a few one summer" (08/07/2013)  . Myocardial infarction ~ 1970  . Exertional shortness of breath     "sometimes" (08/07/2013)  . Type II diabetes mellitus     "fasting 90-110s" (08/07/2013)  . Osteoarthrosis, unspecified whether generalized or localized, unspecified site 04/27/2007  . Prepatellar bursitis   . Arthritis     "in q joint" (08/07/2013)  . Stroke 01/17/2014  . Seizures   . Muscle spasm    Past Surgical History  Procedure Laterality Date  . Tonsillectomy  08/1974  . Replacement total knee Right 09/2005  . Shoulder open rotator cuff repair Right 07/1999  . Appendectomy  1960  . Abdominal hysterectomy  04/1980  . Transthoracic echocardiogram  2003    EF 55-65%; mild concentric LVH  . Joint replacement    . Total knee arthroplasty Left 08/07/2013  . Cataract extraction w/ intraocular lens  implant, bilateral Bilateral ~ 2012  . Total knee arthroplasty Left 08/07/2013    Procedure: TOTAL KNEE ARTHROPLASTY- LEFT;  Surgeon: Dannielle Huh, MD;  Location: [facility] OR;  Service: Orthopedics;  Laterality: Left;  . Cardiac catheterization  2003   Current Outpatient Prescriptions on File Prior to Visit  Medication Sig Dispense Refill  . acetaminophen (TYLENOL) 500 MG tablet Take 500 mg by mouth every 6 (six) hours as needed for moderate pain.    Marland Kitchen albuterol (PROVENTIL HFA;VENTOLIN HFA) 108 (90 BASE) MCG/ACT inhaler Inhale 2 puffs into the lungs every 6 (six) hours as needed for wheezing. 1 Inhaler 0  . allopurinol (ZYLOPRIM) 100 MG tablet TAKE 1 TABLET BY MOUTH EVERY DAY FOR GOUT 30 tablet 1  . aspirin 81 MG tablet Take 81 mg by mouth daily.    . calcium-vitamin D (OSCAL WITH D) 500-200 MG-UNIT per tablet Take 1  tablet by mouth 2 (two) times daily.    . cholecalciferol (VITAMIN D) 1000 UNITS tablet Take 1,000 Units by mouth every other day.    . cloNIDine (CATAPRES) 0.1 MG tablet Take 0.1 mg by mouth daily. For SBP>160    . ferrous sulfate 325 (65 FE) MG tablet Take one tablet by mouth three times daily for anemia 90 tablet 3  . folic acid (FOLVITE) 1 MG tablet Take 12 mg by mouth daily.    Marland Kitchen glucose blood (ONE TOUCH ULTRA TEST) test strip Check blood sugar once daily DX 250.00 100 each 3  . labetalol (NORMODYNE) 100 MG tablet Take 2 tablets (200 mg total) by mouth 2 (two) times daily. 120 tablet 1  . lisinopril (PRINIVIL,ZESTRIL) 5 MG tablet Take one tablet by mouth once daily to protect kidneys 30 tablet 3  . metFORMIN (GLUCOPHAGE) 500 MG tablet TAKE 1 TABLET BY MOUTH EVERY DAY FOR DIABETES 30 tablet 4  . methocarbamol (ROBAXIN) 500 MG tablet Take one tablet by mouth every 8 hours as needed for muscle spasms. 15 tablet 0  . methotrexate (RHEUMATREX) 2.5 MG tablet Provided by rheumatology- started on 4 tablets every week for 2 weeks, then 6 tab every week for 2 weeks, then 8 tab every week 12 tablet 0  . polyvinyl alcohol (LIQUIFILM TEARS) 1.4 % ophthalmic solution Place 1 drop into both eyes as needed (for dry eyes).    . predniSONE (DELTASONE) 5 MG tablet Take 1 tablet (5 mg total) by mouth daily before breakfast. 31 tablet 0  . senna (SENOKOT) 8.6 MG TABS tablet Take one tablet by mouth once daily for constipation 30 each 0  . traMADol (ULTRAM) 50 MG tablet take 1-2 tablets by mouth every 6 hours if needed for MODERATE PAIN 120 tablet 0  . triamcinolone cream (KENALOG) 0.1 % Apply 1 application topically 2 (two) times daily. For affected area    . triamcinolone cream (KENALOG) 0.1 % APPLY TO AFFECTED AREA TWICE DAILY AS NEEDED 30 g 1  . triamcinolone cream (KENALOG) 0.1 % APPLY TO AFFECTED AREA TWICE DAILY AS NEEDED 30 g 2  . vitamin C (ASCORBIC ACID) 500 MG tablet Take 500 mg by mouth daily.    .  VOLTAREN 1 % GEL APPLY TOPICALLY 3 TO 4 TIMES DAILY. 100 g 3   No current facility-administered medications on file prior to visit.   Physical exam BP 140/90 mmHg  Pulse 114  Temp(Src) 98.6 F (37 C) (Oral)  Ht 5\' 4"  (1.626 m)  Wt 189 lb (85.73 kg)  BMI 32.43 kg/m2  SpO2 98%   General- elderly female in no acute distress, obese Head- atraumatic, normocephalic Eyes- PERRLA, EOMI, no pallor, no icterus, no discharge Neck- no lymphadenopathy, no thyromegaly Cardiovascular- normal s1,s2, no murmurs/ rubs/ gallops, normal distal pulses Respiratory- bilateral clear to auscultation, no wheeze, no rhonchi, no crackles Abdomen- bowel sounds present, soft,  non tender Musculoskeletal- able to move all 4 extremities, Limited ROM, using a cane today. Trace leg edema Neurological- no focal deficit Skin- warm and dry Psychiatry- alert and oriented to person, place and time, normal mood and affect   Labs- CBC Latest Ref Rng 09/11/2014 07/03/2014 06/12/2014  WBC - 7.9 9.9 10.0  Hemoglobin 12.0 - 16.0 g/dL 10.1(A) 9.8(L) 9.9(L)  Hematocrit 36 - 46 % 32(A) 30.5(L) 30.2(L)  Platelets 150 - 399 K/L 386 - 365   CMP     Component Value Date/Time   NA 138 09/11/2014   NA 138 06/12/2014 1123   K 4.5 09/11/2014   CL 100 06/12/2014 1123   CO2 22 06/12/2014 1123   GLUCOSE 161* 06/12/2014 1123   GLUCOSE 91 05/01/2014 1452   BUN 16 09/11/2014   BUN 17 06/12/2014 1123   CREATININE 0.7 09/11/2014   CREATININE 0.71 06/12/2014 1123   CALCIUM 9.7 06/12/2014 1123   PROT 7.7 06/12/2014 1123   PROT 7.4 01/12/2014 1148   ALBUMIN 3.1* 06/12/2014 1123   AST 11* 09/11/2014   ALT 8 09/11/2014   ALKPHOS 57 09/11/2014   BILITOT 0.4 06/12/2014 1123   GFRNONAA 78* 06/12/2014 1123   GFRAA >90 06/12/2014 1123   Lab Results  Component Value Date   HGBA1C 6.9* 05/01/2014    Assessment/plan  1. Essential hypertension Stable in OV but elevated readings at home. Will change hydralazine to 50 mg in am and  afternoon and 75 mg in evening. Continue prn clonidine and daily labetalol and lisinopril for now - CMP; Future - Lipid Panel; Future - CBC with Differential; Future - TSH; Future - Hemoglobin A1c; Future  2. Other seasonal allergic rhinitis Monitor clinically, cvontinue prn albuterol and vit c supplement  3. Type 2 diabetes mellitus with other diabetic kidney complication Check a1c in rheumatology office next visit, lab slip provided, continue metformin 500 mg daily, monitor cbg as pt also on chronic steroids - Hemoglobin A1c; Future  4. Rheumatoid arthritis Stable pain, no recent flare up. Continue methotrexate with folic acid and prn tramadol for pain  5. Anemia of chronic disease Stable, on iron supplement and folic acid supplement, monitor - CBC with Differential; Future

## 2014-10-06 ENCOUNTER — Other Ambulatory Visit: Payer: Self-pay | Admitting: Internal Medicine

## 2014-10-08 ENCOUNTER — Other Ambulatory Visit: Payer: Self-pay | Admitting: Internal Medicine

## 2014-10-10 ENCOUNTER — Other Ambulatory Visit: Payer: Self-pay | Admitting: Internal Medicine

## 2014-10-10 ENCOUNTER — Telehealth: Payer: Self-pay | Admitting: *Deleted

## 2014-10-10 NOTE — Telephone Encounter (Signed)
Patient caregiver stopped by and stated that patient has lost the order for labs to be drawn at Rheumatology appointment. Checked last note and order given of Hemoglobin A1C to be drawn. Order rewritten and given

## 2014-10-12 ENCOUNTER — Other Ambulatory Visit: Payer: Self-pay | Admitting: Internal Medicine

## 2014-11-06 ENCOUNTER — Encounter: Payer: Self-pay | Admitting: Internal Medicine

## 2014-11-21 ENCOUNTER — Emergency Department (HOSPITAL_COMMUNITY): Payer: Medicare Other

## 2014-11-21 ENCOUNTER — Encounter (HOSPITAL_COMMUNITY): Payer: Self-pay | Admitting: Emergency Medicine

## 2014-11-21 ENCOUNTER — Emergency Department (HOSPITAL_COMMUNITY)
Admission: EM | Admit: 2014-11-21 | Discharge: 2014-11-21 | Disposition: A | Discharge status: Home / Self Care | Payer: Medicare Other | Attending: Emergency Medicine | Admitting: Emergency Medicine

## 2014-11-21 ENCOUNTER — Telehealth: Payer: Self-pay | Admitting: *Deleted

## 2014-11-21 DIAGNOSIS — M109 Gout, unspecified: Secondary | ICD-10-CM | POA: Diagnosis not present

## 2014-11-21 DIAGNOSIS — I2510 Atherosclerotic heart disease of native coronary artery without angina pectoris: Secondary | ICD-10-CM | POA: Diagnosis not present

## 2014-11-21 DIAGNOSIS — Z79899 Other long term (current) drug therapy: Secondary | ICD-10-CM | POA: Insufficient documentation

## 2014-11-21 DIAGNOSIS — I252 Old myocardial infarction: Secondary | ICD-10-CM | POA: Diagnosis not present

## 2014-11-21 DIAGNOSIS — Z7952 Long term (current) use of systemic steroids: Secondary | ICD-10-CM | POA: Diagnosis not present

## 2014-11-21 DIAGNOSIS — R5383 Other fatigue: Secondary | ICD-10-CM | POA: Diagnosis present

## 2014-11-21 DIAGNOSIS — Z8673 Personal history of transient ischemic attack (TIA), and cerebral infarction without residual deficits: Secondary | ICD-10-CM | POA: Insufficient documentation

## 2014-11-21 DIAGNOSIS — M1990 Unspecified osteoarthritis, unspecified site: Secondary | ICD-10-CM | POA: Diagnosis not present

## 2014-11-21 DIAGNOSIS — I10 Essential (primary) hypertension: Secondary | ICD-10-CM | POA: Insufficient documentation

## 2014-11-21 DIAGNOSIS — Z9889 Other specified postprocedural states: Secondary | ICD-10-CM | POA: Diagnosis not present

## 2014-11-21 DIAGNOSIS — E119 Type 2 diabetes mellitus without complications: Secondary | ICD-10-CM | POA: Diagnosis not present

## 2014-11-21 DIAGNOSIS — Z8709 Personal history of other diseases of the respiratory system: Secondary | ICD-10-CM | POA: Diagnosis not present

## 2014-11-21 DIAGNOSIS — Z872 Personal history of diseases of the skin and subcutaneous tissue: Secondary | ICD-10-CM | POA: Diagnosis not present

## 2014-11-21 DIAGNOSIS — Z7982 Long term (current) use of aspirin: Secondary | ICD-10-CM | POA: Diagnosis not present

## 2014-11-21 DIAGNOSIS — E559 Vitamin D deficiency, unspecified: Secondary | ICD-10-CM | POA: Insufficient documentation

## 2014-11-21 LAB — CBC WITH DIFFERENTIAL/PLATELET
Basophils Absolute: 0 10*3/uL (ref 0.0–0.1)
Basophils Relative: 0 % (ref 0–1)
Eosinophils Absolute: 0.2 10*3/uL (ref 0.0–0.7)
Eosinophils Relative: 3 % (ref 0–5)
HCT: 30.9 % — ABNORMAL LOW (ref 36.0–46.0)
Hemoglobin: 10 g/dL — ABNORMAL LOW (ref 12.0–15.0)
Lymphocytes Relative: 19 % (ref 12–46)
Lymphs Abs: 1.6 10*3/uL (ref 0.7–4.0)
MCH: 29.2 pg (ref 26.0–34.0)
MCHC: 32.4 g/dL (ref 30.0–36.0)
MCV: 90.4 fL (ref 78.0–100.0)
Monocytes Absolute: 0.4 10*3/uL (ref 0.1–1.0)
Monocytes Relative: 4 % (ref 3–12)
Neutro Abs: 6.3 10*3/uL (ref 1.7–7.7)
Neutrophils Relative %: 74 % (ref 43–77)
Platelets: 413 10*3/uL — ABNORMAL HIGH (ref 150–400)
RBC: 3.42 MIL/uL — ABNORMAL LOW (ref 3.87–5.11)
RDW: 18.1 % — ABNORMAL HIGH (ref 11.5–15.5)
WBC: 8.4 10*3/uL (ref 4.0–10.5)

## 2014-11-21 LAB — URINALYSIS, ROUTINE W REFLEX MICROSCOPIC
Bilirubin Urine: NEGATIVE
Glucose, UA: NEGATIVE mg/dL
Hgb urine dipstick: NEGATIVE
Ketones, ur: NEGATIVE mg/dL
Leukocytes, UA: NEGATIVE
Nitrite: NEGATIVE
Protein, ur: NEGATIVE mg/dL
Specific Gravity, Urine: 1.012 (ref 1.005–1.030)
Urobilinogen, UA: 0.2 mg/dL (ref 0.0–1.0)
pH: 6.5 (ref 5.0–8.0)

## 2014-11-21 LAB — BASIC METABOLIC PANEL
Anion gap: 8 (ref 5–15)
BUN: 20 mg/dL (ref 6–23)
CO2: 25 mmol/L (ref 19–32)
Calcium: 9.4 mg/dL (ref 8.4–10.5)
Chloride: 101 mEq/L (ref 96–112)
Creatinine, Ser: 0.77 mg/dL (ref 0.50–1.10)
GFR calc Af Amer: 88 mL/min — ABNORMAL LOW (ref >90)
GFR calc non Af Amer: 76 mL/min — ABNORMAL LOW (ref >90)
Glucose, Bld: 117 mg/dL — ABNORMAL HIGH (ref 70–99)
Potassium: 4.1 mmol/L (ref 3.5–5.1)
Sodium: 134 mmol/L — ABNORMAL LOW (ref 135–145)

## 2014-11-21 LAB — CBG MONITORING, ED: Glucose-Capillary: 108 mg/dL — ABNORMAL HIGH (ref 70–99)

## 2014-11-21 MED ORDER — SODIUM CHLORIDE 0.9 % IV BOLUS (SEPSIS)
500.0000 mL | Freq: Once | INTRAVENOUS | Status: AC
  Administered 2014-11-21: 500 mL via INTRAVENOUS

## 2014-11-21 NOTE — Telephone Encounter (Signed)
Called and spoke with Elease Hashimoto, daughter and she stated that patients BP did come down with the Clonidine. Daughter stated that she is going to call [facility] and see if she can get placement for her mother. Daughter does not feel like patient can stay at home any longer without 24/7 care due to her weakness. She wanted you to know so you can fill out FL2 Form if needed.

## 2014-11-21 NOTE — Telephone Encounter (Signed)
Please have them give her clonidine 0.1 mg x 1 (she has this in her med list to be taken on as needed basis for BP > 160/90)

## 2014-11-21 NOTE — ED Provider Notes (Deleted)
INCISION AND DRAINAGE Performed by: Sharlene Motts Consent: Verbal consent obtained. Risks and benefits: risks, benefits and alternatives were discussed Type: abscess  Body area: Left anterior thigh  Anesthesia: local infiltration  Incision was made with a scalpel.  Local anesthetic: lidocaine 2% with epinephrine  Anesthetic total: 3 ml  Complexity: complex Blunt dissection to break up loculations  Drainage: purulent  Drainage amount: 5 mL purulent   Packing material: None   Patient tolerance: Patient tolerated the procedure well with no immediate complications.     Sharlene Motts, PA-C 11/21/14 1946

## 2014-11-21 NOTE — ED Provider Notes (Signed)
CSN: [id number]     Arrival date & time 11/21/14  1653 History   First MD Initiated Contact with Patient 11/21/14 1700     Chief Complaint  Patient presents with  . Fatigue     (Consider location/radiation/quality/duration/timing/severity/associated sxs/prior Treatment) HPI    PCP: Oneal Grout, MD Blood pressure 196/72, pulse 88, temperature 98.5 F (36.9 C), temperature source Oral, resp. rate 16, SpO2 99 %.  [name] [name] is a 81 y.o.female with a significant PMH of hypertension, gout, hyperlipidemia, cough, TIA, MI, diabetes,  Seizures, stroke, muscle spasm  presents to the ER with complaints of feeling fatigued. She reports it started last night. It has not been accompanied by any other symptoms. She has recently started taking methotrexate for her arthritis. She woke up this morning feeling fatigued, was able to walk to the door to allow her home aid into the home. She has not had any focal weakness, confusion, nausea, vomiting, diarrhea, cough, dysuria, urinary frequency. She does admit to not eating as much today as normal.    Past Medical History  Diagnosis Date  . Hypertension   . First degree atrioventricular block   . Onychia and paronychia of toe   . Muscle weakness (generalized)   . Allergic rhinitis due to pollen 04/27/2007  . Gout, unspecified 04/19/2013  . Disorder of bone and cartilage, unspecified   . Edema 05/03/2013  . Hyperlipidemia 04/27/2007  . Unspecified essential hypertension 08/08/2013  . Coronary atherosclerosis of native coronary artery   . Other malaise and fatigue   . Cough   . Other fall   . Unspecified vitamin D deficiency   . TIA (transient ischemic attack)     "a few one summer" (08/07/2013)  . Myocardial infarction ~ 1970  . Exertional shortness of breath     "sometimes" (08/07/2013)  . Type II diabetes mellitus     "fasting 90-110s" (08/07/2013)  . Osteoarthrosis, unspecified whether generalized or localized, unspecified site  04/27/2007  . Prepatellar bursitis   . Arthritis     "in q joint" (08/07/2013)  . Stroke 01/17/2014  . Seizures   . Muscle spasm    Past Surgical History  Procedure Laterality Date  . Tonsillectomy  08/1974  . Replacement total knee Right 09/2005  . Shoulder open rotator cuff repair Right 07/1999  . Appendectomy  1960  . Abdominal hysterectomy  04/1980  . Transthoracic echocardiogram  2003    EF 55-65%; mild concentric LVH  . Joint replacement    . Total knee arthroplasty Left 08/07/2013  . Cataract extraction w/ intraocular lens  implant, bilateral Bilateral ~ 2012  . Total knee arthroplasty Left 08/07/2013    Procedure: TOTAL KNEE ARTHROPLASTY- LEFT;  Surgeon: Dannielle Huh, MD;  Location: [facility] OR;  Service: Orthopedics;  Laterality: Left;  . Cardiac catheterization  2003   Family History  Problem Relation Age of Onset  . Heart attack Father    History  Substance Use Topics  . Smoking status: Never Smoker   . Smokeless tobacco: Never Used  . Alcohol Use: No   OB History    No data available     Review of Systems  10 Systems reviewed and are negative for acute change except as noted in the HPI.     Allergies  Cetirizine hcl; Codeine; Fish allergy; Indomethacin; Lyrica; Methyldopa; Shellfish allergy; and Tomato  Home Medications   Prior to Admission medications   Medication Sig Start Date End Date Taking? Authorizing Provider  acetaminophen (TYLENOL)  500 MG tablet Take 500 mg by mouth every 6 (six) hours as needed for moderate pain.    Historical Provider, MD  albuterol (PROVENTIL HFA;VENTOLIN HFA) 108 (90 BASE) MCG/ACT inhaler Inhale 2 puffs into the lungs every 6 (six) hours as needed for wheezing. 04/19/13   Oneal Grout, MD  allopurinol (ZYLOPRIM) 100 MG tablet TAKE 1 TABLET BY MOUTH EVERY DAY FOR GOUT 10/08/14   Jo-Ann Johanning L Reed, DO  aspirin 81 MG tablet Take 81 mg by mouth daily.    Historical Provider, MD  calcium-vitamin D (OSCAL WITH D) 500-200 MG-UNIT per tablet  Take 1 tablet by mouth 2 (two) times daily.    Historical Provider, MD  cholecalciferol (VITAMIN D) 1000 UNITS tablet Take 1,000 Units by mouth every other day.    Historical Provider, MD  cloNIDine (CATAPRES) 0.1 MG tablet Take 0.1 mg by mouth daily. For SBP>160 02/06/14   Oneal Grout, MD  ferrous sulfate 325 (65 FE) MG tablet Take one tablet by mouth three times daily for anemia 07/12/14   Oneal Grout, MD  folic acid (FOLVITE) 1 MG tablet Take 12 mg by mouth daily.    Historical Provider, MD  glucose blood (ONE TOUCH ULTRA TEST) test strip Check blood sugar once daily DX 250.00 05/18/14   Diona Peregoy L Reed, DO  hydrALAZINE (APRESOLINE) 50 MG tablet Take one tablet by mouth in morning and afternoon and one and half tablet in evening to control blood pressure. 09/26/14   Oneal Grout, MD  labetalol (NORMODYNE) 100 MG tablet take 2 tablets by mouth twice a day 10/08/14   Beva Remund L Reed, DO  lisinopril (PRINIVIL,ZESTRIL) 5 MG tablet take 1 tablet by mouth once daily TO PROTECT KIDNEYS 10/12/14   Carlita Whitcomb L Reed, DO  metFORMIN (GLUCOPHAGE) 500 MG tablet TAKE 1 TABLET BY MOUTH EVERY DAY FOR DIABETES 07/25/14   Kimber Relic, MD  methocarbamol (ROBAXIN) 500 MG tablet Take one tablet by mouth every 8 hours as needed for muscle spasms. 06/08/14   Oneal Grout, MD  methotrexate (RHEUMATREX) 2.5 MG tablet Provided by rheumatology- started on 4 tablets every week for 2 weeks, then 6 tab every week for 2 weeks, then 8 tab every week 06/27/14   Oneal Grout, MD  polyvinyl alcohol (LIQUIFILM TEARS) 1.4 % ophthalmic solution Place 1 drop into both eyes as needed (for dry eyes).    Historical Provider, MD  predniSONE (DELTASONE) 5 MG tablet Take 1 tablet (5 mg total) by mouth daily before breakfast. 01/22/14   Rodolph Bong, MD  senna (SENOKOT) 8.6 MG TABS tablet Take one tablet by mouth once daily for constipation 07/12/14   Oneal Grout, MD  traMADol (ULTRAM) 50 MG tablet take 1-2 tablets by mouth every 6 hours if  needed for MODERATE PAIN 10/12/14   Ferlando Lia L Reed, DO  triamcinolone cream (KENALOG) 0.1 % Apply 1 application topically 2 (two) times daily. For affected area    Historical Provider, MD  triamcinolone cream (KENALOG) 0.1 % APPLY TO AFFECTED AREA TWICE DAILY AS NEEDED 08/29/14   Oneal Grout, MD  triamcinolone cream (KENALOG) 0.1 % APPLY TO AFFECTED AREA TWICE DAILY AS NEEDED 10/11/14   Gwenlyn Hottinger L Reed, DO  vitamin C (ASCORBIC ACID) 500 MG tablet Take 500 mg by mouth daily.    Historical Provider, MD  VOLTAREN 1 % GEL APPLY TOPICALLY 3 TO 4 TIMES DAILY. 10/08/14   Suzy Kugel L Reed, DO   BP 163/78 mmHg  Pulse 89  Temp(Src) 98.2 F (36.8  C) (Oral)  Resp 18  SpO2 96% Physical Exam  Constitutional: She appears well-developed and well-nourished. No distress.  HENT:  Head: Normocephalic and atraumatic.  Eyes: Pupils are equal, round, and reactive to light.  Neck: Normal range of motion. Neck supple.  Cardiovascular: Normal rate and regular rhythm.   Pulmonary/Chest: Effort normal and breath sounds normal. She has no decreased breath sounds. She has no wheezes.  Abdominal: Soft.  Neurological: She is alert.  Cranial nerves II-VIII and X-XII evaluated and show no deficits. Pt alert and oriented x 3 Upper and lower extremity strength is symmetrical and physiologic Normal muscular tone No facial droop Coordination intact, no limb ataxia, finger-nose-finger normal Rapid alternating movements normal No pronator drift  Skin: Skin is warm and dry.  Nursing note and vitals reviewed.   ED Course  Procedures (including critical care time) Labs Review Labs Reviewed  URINALYSIS, ROUTINE W REFLEX MICROSCOPIC  CBC WITH DIFFERENTIAL  BASIC METABOLIC PANEL    Imaging Review No results found.   EKG Interpretation None      MDM   Final diagnoses:  Fatigue   8:31 pm Dr. Madilyn Hook has seen patient as well. She looks well, non toxic appearing. The patient is not orthostatic. Not febrile. Normal  chest xray and laboratory work-up. Urinalysis pending. If the patient does not have a UTI she will be able to return to home.  10:30 pm The patients labs have returned normal. No UTI, normal chest xray, labs are unremarkable. I made the patient and her daughter aware of this and the daughter said she wanted her mother to be admitted. She says her mom is too weak to go home and that her primary care doctor told her to come to the ER to be admitted. I reviewed her PCP notes, she did contact the PCP but it was to fill out a form for a 24/7 care facility which the PCP agreed to just today. The techs walked the patient with a walker.   ED Notes, ED Provider Notes    Juanetta Beets, NT 11/21/2014 23:30   Expand All Collapse All   Ambulated patient with a walker in the hall she had no problem       The patient was not orthostatic. Dr. Madilyn Hook is aware and agreeable to patients discharge.  81 y.o.[name] [name]'s evaluation in the Emergency Department is complete. It has been determined that no acute conditions requiring further emergency intervention are present at this time. The patient/guardian have been advised of the diagnosis and plan. We have discussed signs and symptoms that warrant return to the ED, such as changes or worsening in symptoms.  Vital signs are stable at discharge. Filed Vitals:   11/21/14 2142  BP: 174/80  Pulse: 98  Temp:   Resp: 18    Patient/guardian has voiced understanding and agreed to follow-up with the PCP or specialist.   Dorthula Matas, PA-C 11/21/14 3267  Tilden Fossa, MD 11/21/14 2357

## 2014-11-21 NOTE — Telephone Encounter (Signed)
Received phone call and fax from [place] with [facility] regarding patient's BP Reading for today. Nurse wanted to make you aware that it was 167/93 this morning. Please Advise.

## 2014-11-21 NOTE — ED Notes (Signed)
Per EMS: Pt c/o generalized illness and fatigue, onset yesterday. Pt recently started taking methotrexate for arthritis, was informed that it could suppress her immune system. Denies pain, No N/V/D. Warm to the touch, elevated HR of 110 and BP 176/84.

## 2014-11-21 NOTE — Telephone Encounter (Signed)
That will be a very wise decision, I will fill out the FL2 form when needed

## 2014-11-21 NOTE — Telephone Encounter (Signed)
Daughter called [facility] and they told her that patient had to be hospitalized for 3 days then come to them. So daughter is going to send her to the hospital due to her weakness and fatigue. Daughter stated that patient is just not doing well home by herself.

## 2014-11-21 NOTE — ED Notes (Signed)
Ambulated patient with a walker in the hall she had no problem

## 2014-11-21 NOTE — Discharge Instructions (Signed)

## 2014-11-21 NOTE — ED Notes (Signed)
Bed: WA04 Expected date:  Expected time:  Means of arrival:  Comments: 81 y/o F

## 2014-11-21 NOTE — ED Notes (Signed)
Patient is still unable to give urine sample, patient has been given ginger ale and is aware that they might need to have an in and out catheter.

## 2014-11-26 ENCOUNTER — Other Ambulatory Visit: Payer: Self-pay | Admitting: *Deleted

## 2014-11-26 MED ORDER — TRAMADOL HCL 50 MG PO TABS: ORAL_TABLET | ORAL | Status: DC

## 2014-11-26 NOTE — Telephone Encounter (Signed)
[facility] 

## 2014-11-27 ENCOUNTER — Ambulatory Visit (INDEPENDENT_AMBULATORY_CARE_PROVIDER_SITE_OTHER): Payer: Medicare Other | Admitting: Internal Medicine

## 2014-11-27 ENCOUNTER — Encounter: Payer: Self-pay | Admitting: Internal Medicine

## 2014-11-27 VITALS — BP 162/84 | HR 96 | Temp 97.8°F | Resp 20 | Ht 64.0 in | Wt 184.0 lb

## 2014-11-27 DIAGNOSIS — J069 Acute upper respiratory infection, unspecified: Secondary | ICD-10-CM

## 2014-11-27 DIAGNOSIS — I10 Essential (primary) hypertension: Secondary | ICD-10-CM

## 2014-11-27 DIAGNOSIS — R809 Proteinuria, unspecified: Secondary | ICD-10-CM

## 2014-11-27 DIAGNOSIS — Z0289 Encounter for other administrative examinations: Secondary | ICD-10-CM

## 2014-11-27 DIAGNOSIS — J4520 Mild intermittent asthma, uncomplicated: Secondary | ICD-10-CM

## 2014-11-27 DIAGNOSIS — E119 Type 2 diabetes mellitus without complications: Secondary | ICD-10-CM

## 2014-11-27 DIAGNOSIS — E1129 Type 2 diabetes mellitus with other diabetic kidney complication: Secondary | ICD-10-CM

## 2014-11-27 DIAGNOSIS — E1165 Type 2 diabetes mellitus with hyperglycemia: Secondary | ICD-10-CM

## 2014-11-27 MED ORDER — AZITHROMYCIN 250 MG PO TABS: ORAL_TABLET | ORAL | Status: DC

## 2014-11-27 MED ORDER — ALBUTEROL SULFATE HFA 108 (90 BASE) MCG/ACT IN AERS: 2.0000 | INHALATION_SPRAY | Freq: Four times a day (QID) | RESPIRATORY_TRACT | Status: DC | PRN

## 2014-11-27 NOTE — Progress Notes (Signed)
Patient ID: [name] [name], female   DOB: [birth year], 81 y.o.   MRN: [id number]    Chief Complaint  Patient presents with  . Hospitalization Follow-up   Allergies  Allergen Reactions  . Cetirizine Hcl Other (See Comments)    unknown  . Codeine Other (See Comments)    unknown  . Fish Allergy   . Indomethacin Other (See Comments)    unknown  . Lyrica [Pregabalin]     hallucinations  . Methyldopa Other (See Comments)    unknown  . Shellfish Allergy Hives  . Tomato Rash   HPI 81 y/o female pt is here for ED follow up for generalized weakness. Her infectious workup was negative. She mentions feeling better today but still weak.  Has cough with clear phlegm x 1 week. It worsened and now has left ear ache since Sunday. Denies ear drainage. Denies runny nose or sore throat. Denies fever or chills. Denies dyspnea Pt and daughter would like to look into assisted living facility for continuity of care. bp at home 115-140s/ 80-96 cbg at home 110- 160. Denies hypoglycemic episodes  Review of Systems   Constitutional: Negative for fever, chills, diaphoresis.   HENT: Negative for sore throat.    Eyes: Negative for blurred vision, double vision and discharge.   Respiratory: Negative for shortness of breath but has wheezing.    Cardiovascular: Negative for chest pain, palpitations. Trace swelling  Gastrointestinal: Negative for heartburn, nausea, vomiting, abdominal pain, diarrhea and constipation.   Genitourinary: Negative for dysuria.  has urinary incontinence Musculoskeletal: has joint pain and low back pain better controlled. No falls reported. Has RA and OA Skin: Negative for itching and rash.   Neurological: Negative for dizziness and headaches.   Psychiatric/Behavioral: Negative for depression   Past Medical History  Diagnosis Date  . Hypertension   . First degree atrioventricular block   . Onychia and paronychia of toe   . Muscle weakness (generalized)   . Allergic rhinitis  due to pollen 04/27/2007  . Gout, unspecified 04/19/2013  . Disorder of bone and cartilage, unspecified   . Edema 05/03/2013  . Hyperlipidemia 04/27/2007  . Unspecified essential hypertension 08/08/2013  . Coronary atherosclerosis of native coronary artery   . Other malaise and fatigue   . Cough   . Other fall   . Unspecified vitamin D deficiency   . TIA (transient ischemic attack)     "a few one summer" (08/07/2013)  . Myocardial infarction ~ 1970  . Exertional shortness of breath     "sometimes" (08/07/2013)  . Type II diabetes mellitus     "fasting 90-110s" (08/07/2013)  . Osteoarthrosis, unspecified whether generalized or localized, unspecified site 04/27/2007  . Prepatellar bursitis   . Arthritis     "in q joint" (08/07/2013)  . Stroke 01/17/2014  . Seizures   . Muscle spasm    Current Outpatient Prescriptions on File Prior to Visit  Medication Sig Dispense Refill  . acetaminophen (TYLENOL) 500 MG tablet Take 500 mg by mouth every 6 (six) hours as needed for moderate pain.    Marland Kitchen allopurinol (ZYLOPRIM) 100 MG tablet TAKE 1 TABLET BY MOUTH EVERY DAY FOR GOUT 30 tablet 2  . aspirin 81 MG tablet Take 81 mg by mouth daily.    . calcium-vitamin D (OSCAL WITH D) 500-200 MG-UNIT per tablet Take 1 tablet by mouth 2 (two) times daily.    . cholecalciferol (VITAMIN D) 1000 UNITS tablet Take 1,000 Units by mouth daily.     Marland Kitchen  cloNIDine (CATAPRES) 0.1 MG tablet Take 0.1 mg by mouth daily as needed (blood presure is above 160). For SBP>160    . ferrous sulfate 325 (65 FE) MG tablet Take one tablet by mouth three times daily for anemia 90 tablet 3  . folic acid (FOLVITE) 1 MG tablet Take 2 mg by mouth daily.     Marland Kitchen glucose blood (ONE TOUCH ULTRA TEST) test strip Check blood sugar once daily DX 250.00 100 each 3  . hydrALAZINE (APRESOLINE) 50 MG tablet Take one tablet by mouth in morning and afternoon and one and half tablet in evening to control blood pressure. (Patient taking differently: 50-75  mg 2 (two) times daily. Take one tablet by mouth in morning and one and half tablet in evening to control blood pressure.) 90 tablet 2  . labetalol (NORMODYNE) 100 MG tablet take 2 tablets by mouth twice a day 120 tablet 1  . lisinopril (PRINIVIL,ZESTRIL) 5 MG tablet take 1 tablet by mouth once daily TO PROTECT KIDNEYS 30 tablet 3  . metFORMIN (GLUCOPHAGE) 500 MG tablet TAKE 1 TABLET BY MOUTH EVERY DAY FOR DIABETES 30 tablet 4  . methocarbamol (ROBAXIN) 500 MG tablet Take one tablet by mouth every 8 hours as needed for muscle spasms. 15 tablet 0  . methotrexate (RHEUMATREX) 2.5 MG tablet Provided by rheumatology- started on 4 tablets every week for 2 weeks, then 6 tab every week for 2 weeks, then 8 tab every week (Patient taking differently: Take 10-20 mg by mouth once a week. started on 4 tablets every week for 2 weeks, then 6 tab every week for 2 weeks, then 8 tab every week. Pt takes on Fridays) 12 tablet 0  . OVER THE COUNTER MEDICATION Take 1 tablet by mouth 2 (two) times daily. Wellness formula for immune support    . polyvinyl alcohol (LIQUIFILM TEARS) 1.4 % ophthalmic solution Place 1 drop into both eyes as needed (for dry eyes).    . predniSONE (DELTASONE) 5 MG tablet Take 1 tablet (5 mg total) by mouth daily before breakfast. 31 tablet 0  . senna (SENOKOT) 8.6 MG TABS tablet Take one tablet by mouth once daily for constipation 30 each 0  . traMADol (ULTRAM) 50 MG tablet Take one to two tablets by mouth every 6 hours as needed for moderate pain 120 tablet 0  . triamcinolone cream (KENALOG) 0.1 % APPLY TO AFFECTED AREA TWICE DAILY AS NEEDED 30 g 2  . triamcinolone cream (KENALOG) 0.1 % APPLY TO AFFECTED AREA TWICE DAILY AS NEEDED 28.4 g 1  . vitamin C (ASCORBIC ACID) 500 MG tablet Take 500 mg by mouth daily.    . VOLTAREN 1 % GEL APPLY TOPICALLY 3 TO 4 TIMES DAILY. 100 g 2   No current facility-administered medications on file prior to visit.    Physical exam BP 162/84 mmHg  Pulse 96   Temp(Src) 97.8 F (36.6 C) (Oral)  Resp 20  Ht 5\' 4"  (1.626 m)  Wt 184 lb (83.462 kg)  BMI 31.57 kg/m2  SpO2 97%  General- elderly female in no acute distress, obese Head- atraumatic, normocephalic Eyes- PERRLA, EOMI, no pallor, no icterus, no discharge Neck- left anterior cervical lymph node slightly enlarged and tender.  Mouth- moist mucus membrane, oropharyngeal erythema noted Cardiovascular- normal s1,s2, no murmurs, normal distal pulses Respiratory- bilateral clear to auscultation, no wheeze, no rhonchi, no crackles Abdomen- bowel sounds present, soft, non tender Musculoskeletal- able to move all 4 extremities, Limited ROM, using a cane  today. Trace leg edema Neurological- no focal deficit Skin- warm and dry Psychiatry- alert and oriented to person, place and time, normal mood and affect    Lab Results  Component Value Date   HGBA1C 6.9* 05/01/2014   CMP Latest Ref Rng 11/21/2014 09/11/2014 06/12/2014  Glucose 70 - 99 mg/dL 616(W) - 737(T)  BUN 6 - 23 mg/dL 20 16 17   Creatinine 0.50 - 1.10 mg/dL 0.7 0.62  Sodium 6.94 - 145 mmol/L 134(L) 138 138  Potassium 3.5 - 5.1 mmol/L 4.1 4.5 3.9  Chloride 96 - 112 mEq/L 101 - 100  CO2 19 - 32 mmol/L 25 - 22  Calcium 8.4 - 10.5 mg/dL 9.4 - 9.7  Total Protein 6.0 - 8.3 g/dL - - 7.7  Albumin 3.5 - 4.7 g/dL - - -  Total Bilirubin 0.3 - 1.2 mg/dL - - 0.4  Alkaline Phos 25 - 125 U/L - 57 62  AST 13 - 35 U/L - 11(A) 12  ALT 7 - 35 U/L - 8 6   Assessment/plan  1. Reactive airway disease, mild intermittent, uncomplicated - albuterol (PROVENTIL HFA;VENTOLIN HFA) 108 (90 BASE) MCG/ACT inhaler; Inhale 2 puffs into the lungs every 6 (six) hours as needed for wheezing.  Dispense: 1 Inhaler; Refill: 3  2. Type 2 diabetes mellitus with microalbuminuria or microproteinuria Continue metformin, check a1c, monitor cbg  3. URI Course of z pack for 5 days with albuterol qid x 5 days, then prn, rest and hydration encouraged  4. FL2 form Form  filled out for assisted living, pt needs assistance with bathing and dressing and is incontinent with urine. Will need bp check once a day with her HTN  5. HTN Elevated bp in office but both pt nad daughter mentions readings controlled at home and refuses med changes. No changes made.

## 2014-11-28 LAB — HEMOGLOBIN A1C
Est. average glucose Bld gHb Est-mCnc: 128 mg/dL
Hgb A1c MFr Bld: 6.1 % — ABNORMAL HIGH (ref 4.8–5.6)

## 2014-12-10 ENCOUNTER — Other Ambulatory Visit: Payer: Self-pay | Admitting: *Deleted

## 2014-12-10 MED ORDER — LABETALOL HCL 100 MG PO TABS: 200.0000 mg | ORAL_TABLET | Freq: Two times a day (BID) | ORAL | Status: DC

## 2014-12-10 NOTE — Telephone Encounter (Signed)
[facility] 

## 2014-12-11 ENCOUNTER — Other Ambulatory Visit: Payer: Self-pay | Admitting: Internal Medicine

## 2014-12-14 ENCOUNTER — Other Ambulatory Visit: Payer: Medicare Other

## 2014-12-18 ENCOUNTER — Ambulatory Visit: Payer: Medicare Other | Admitting: Internal Medicine

## 2015-01-01 ENCOUNTER — Encounter: Payer: Self-pay | Admitting: Internal Medicine

## 2015-01-01 ENCOUNTER — Telehealth: Payer: Self-pay | Admitting: *Deleted

## 2015-01-01 NOTE — Telephone Encounter (Signed)
Does she have any further symptoms? If not, i will wait and watch for now. If the symptom recurs, i will need to assess her in the office

## 2015-01-01 NOTE — Telephone Encounter (Signed)
Patient caregiver, Para March called and stated that patient had an episode last Thursday after taking the Methotrexate. Stated that she woke up disorient, drooling and shaking. They called the EMS and they evaluated her and stated that her vitals and everything was stable. They called the rheumatologist and she told them that it sounded Neurological and for them to call Primary. Patient is acting and feeling fine now. They want to know if they need a referral to Neurologist. Please Advise.

## 2015-01-02 NOTE — Telephone Encounter (Signed)
LMOM to return call.

## 2015-01-02 NOTE — Telephone Encounter (Signed)
Patient Notified

## 2015-01-07 ENCOUNTER — Other Ambulatory Visit: Payer: Self-pay | Admitting: Internal Medicine

## 2015-01-08 ENCOUNTER — Other Ambulatory Visit: Payer: Self-pay | Admitting: *Deleted

## 2015-01-08 NOTE — Telephone Encounter (Signed)
Received fax confirmation from [facility] confirming patient's Hydralazine 25mg . They wanted patient's current directions because they stated patient is not taking the medication as prescribed. Looked up the OV note from Dr. and confirmed. Given fax to Dr. Glade Lloyd

## 2015-01-14 ENCOUNTER — Other Ambulatory Visit: Payer: Self-pay | Admitting: *Deleted

## 2015-01-14 MED ORDER — LABETALOL HCL 100 MG PO TABS: 200.0000 mg | ORAL_TABLET | Freq: Two times a day (BID) | ORAL | Status: DC

## 2015-01-14 MED ORDER — PREDNISONE 5 MG PO TABS: 5.0000 mg | ORAL_TABLET | Freq: Every day | ORAL | Status: DC

## 2015-01-14 NOTE — Telephone Encounter (Signed)
[facility] 

## 2015-01-23 ENCOUNTER — Encounter: Payer: Self-pay | Admitting: Internal Medicine

## 2015-01-23 ENCOUNTER — Ambulatory Visit (INDEPENDENT_AMBULATORY_CARE_PROVIDER_SITE_OTHER): Payer: Medicare Other | Admitting: Internal Medicine

## 2015-01-23 VITALS — BP 142/82 | HR 108 | Temp 99.0°F | Resp 20 | Ht 64.0 in | Wt 184.4 lb

## 2015-01-23 DIAGNOSIS — M069 Rheumatoid arthritis, unspecified: Secondary | ICD-10-CM | POA: Diagnosis not present

## 2015-01-23 DIAGNOSIS — I10 Essential (primary) hypertension: Secondary | ICD-10-CM

## 2015-01-23 DIAGNOSIS — E1129 Type 2 diabetes mellitus with other diabetic kidney complication: Secondary | ICD-10-CM

## 2015-01-23 DIAGNOSIS — M1000 Idiopathic gout, unspecified site: Secondary | ICD-10-CM

## 2015-01-23 DIAGNOSIS — M25559 Pain in unspecified hip: Secondary | ICD-10-CM | POA: Diagnosis not present

## 2015-01-23 MED ORDER — PREDNISONE 10 MG PO TABS: ORAL_TABLET | ORAL | Status: DC

## 2015-01-23 NOTE — Progress Notes (Signed)
Patient ID: [name] [name], female   DOB: [birth year], 81 y.o.   MRN: [id number]    Facility  [facility]    Place of Service:   OFFICE   Allergies  Allergen Reactions  . Cetirizine Hcl Other (See Comments)    unknown  . Codeine Other (See Comments)    unknown  . Fish Allergy   . Indomethacin Other (See Comments)    unknown  . Lyrica [Pregabalin]     hallucinations  . Methyldopa Other (See Comments)    unknown  . Shellfish Allergy Hives  . Tomato Rash    Chief Complaint  Patient presents with  . Acute Visit    right lower groin pain since Sat/Sun    HPI:  18 to female seen today for acute visit. She c/o right hip pain after stepping down out of SUV on March 12th. She states she "slid my feet onto the ground" in order to get out the truck as it was a high step but did not fall. She has pain with movement (turning over in bed and changing position from sitting to standing). Denies numbness or tingling. No f/c. She has noticed swelling in her hands, right shoulder and knees. She takes chronic prednisone 60m daily and MTX weekly for RA. She also uses voltaren gel and takes prn tramadol, robaxin and tylenol. She has chronic gout and takes allopurinol.  Her BP is stable on lisinopril, labetalol, hydralazine, clonidine. She takes metformin for DM. Her BS are stable.  Past Medical History  Diagnosis Date  . Hypertension   . First degree atrioventricular block   . Onychia and paronychia of toe   . Muscle weakness (generalized)   . Allergic rhinitis due to pollen 04/27/2007  . Gout, unspecified 04/19/2013  . Disorder of bone and cartilage, unspecified   . Edema 05/03/2013  . Hyperlipidemia 04/27/2007  . Unspecified essential hypertension 08/08/2013  . Coronary atherosclerosis of native coronary artery   . Other malaise and fatigue   . Cough   . Other fall   . Unspecified vitamin D deficiency   . TIA (transient ischemic attack)     "a few one summer" (08/07/2013)  . Myocardial  infarction ~ 1970  . Exertional shortness of breath     "sometimes" (08/07/2013)  . Type II diabetes mellitus     "fasting 90-110s" (08/07/2013)  . Osteoarthrosis, unspecified whether generalized or localized, unspecified site 04/27/2007  . Prepatellar bursitis   . Arthritis     "in q joint" (08/07/2013)  . Stroke 01/17/2014  . Seizures   . Muscle spasm    Past Surgical History  Procedure Laterality Date  . Tonsillectomy  08/1974  . Replacement total knee Right 09/2005  . Shoulder open rotator cuff repair Right 07/1999  . Appendectomy  1960  . Abdominal hysterectomy  04/1980  . Transthoracic echocardiogram  2003    EF 55-65%; mild concentric LVH  . Joint replacement    . Total knee arthroplasty Left 08/07/2013  . Cataract extraction w/ intraocular lens  implant, bilateral Bilateral ~ 2012  . Total knee arthroplasty Left 08/07/2013    Procedure: TOTAL KNEE ARTHROPLASTY- LEFT;  Surgeon: SVickey Huger MD;  [facility]  Service: Orthopedics;  Laterality: Left;  . Cardiac catheterization  2003   History   Social History  . Marital Status: Widowed    Spouse Name: N/A  . Number of Children: N/A  . Years of Education: N/A   Social History Main Topics  .  Smoking status: Never Smoker   . Smokeless tobacco: Never Used  . Alcohol Use: No  . Drug Use: No  . Sexual Activity: No   Other Topics Concern  . None   Social History Narrative   Widowed   Psychologist, counselling with cane    Medications: [name] Medications  New Prescriptions   No medications on file  Previous Medications   ACETAMINOPHEN (TYLENOL) 500 MG TABLET    Take 500 mg by mouth every 6 (six) hours as needed for moderate pain.   ALBUTEROL (PROVENTIL HFA;VENTOLIN HFA) 108 (90 BASE) MCG/ACT INHALER    Inhale 2 puffs into the lungs every 6 (six) hours as needed for wheezing.   ALLOPURINOL (ZYLOPRIM) 100 MG TABLET    TAKE 1 TABLET BY MOUTH EVERY DAY FOR GOUT   ASPIRIN 81 MG TABLET    Take 81 mg by mouth daily.   CALCIUM-VITAMIN  D (OSCAL WITH D) 500-200 MG-UNIT PER TABLET    Take 1 tablet by mouth 2 (two) times daily.   CHOLECALCIFEROL (VITAMIN D) 1000 UNITS TABLET    Take 1,000 Units by mouth daily.    CLONIDINE (CATAPRES) 0.1 MG TABLET    Take 0.1 mg by mouth daily as needed (blood presure is above 160). For SBP>160   FERROUS SULFATE 325 (65 FE) MG TABLET    Take one tablet by mouth three times daily for anemia   FOLIC ACID (FOLVITE) 1 MG TABLET    Take 2 mg by mouth daily.    GLUCOSE BLOOD (ONE TOUCH ULTRA TEST) TEST STRIP    Check blood sugar once daily DX 250.00   HYDRALAZINE (APRESOLINE) 50 MG TABLET    Take one tablet by mouth in the morning and afternoon and One and half tablet in the evening for blood pressure   LABETALOL (NORMODYNE) 100 MG TABLET    Take 2 tablets (200 mg total) by mouth 2 (two) times daily.   LISINOPRIL (PRINIVIL,ZESTRIL) 5 MG TABLET    take 1 tablet by mouth once daily TO PROTECT KIDNEYS   METFORMIN (GLUCOPHAGE) 500 MG TABLET    TAKE 1 TABLET BY MOUTH EVERY DAY FOR DIABETES   METHOCARBAMOL (ROBAXIN) 500 MG TABLET    Take one tablet by mouth every 8 hours as needed for muscle spasms.   METHOTREXATE (RHEUMATREX) 2.5 MG TABLET    Provided by rheumatology- started on 4 tablets every week for 2 weeks, then 6 tab every week for 2 weeks, then 8 tab every week   OVER THE COUNTER MEDICATION    Take 1 tablet by mouth 2 (two) times daily. Wellness formula for immune support   POLYVINYL ALCOHOL (LIQUIFILM TEARS) 1.4 % OPHTHALMIC SOLUTION    Place 1 drop into both eyes as needed (for dry eyes).   PREDNISONE (DELTASONE) 5 MG TABLET    Take 1 tablet (5 mg total) by mouth daily before breakfast.   SENNA (SENOKOT) 8.6 MG TABS TABLET    Take one tablet by mouth once daily for constipation   TRAMADOL (ULTRAM) 50 MG TABLET    Take one to two tablets by mouth every 6 hours as needed for moderate pain   TRIAMCINOLONE CREAM (KENALOG) 0.1 %    APPLY TO AFFECTED AREA TWICE DAILY AS NEEDED   TRIAMCINOLONE CREAM  (KENALOG) 0.1 %    APPLY TO AFFECTED AREA TWICE DAILY AS NEEDED   VITAMIN C (ASCORBIC ACID) 500 MG TABLET    Take 500 mg by mouth daily.   VOLTAREN 1 %  GEL    APPLY TO AFFECTED AREA 3-4 TIMES DAILY  Modified Medications   No medications on file  Discontinued Medications   AZITHROMYCIN (ZITHROMAX) 250 MG TABLET    Take 2 tablets today and then one tablet daily for next 4 days for upper respiratory tract infection     Review of Systems  Constitutional: Positive for activity change and fatigue. Negative for fever and chills.  Musculoskeletal: Positive for myalgias, back pain, joint swelling, arthralgias, gait problem and neck pain.  Neurological: Positive for weakness. Negative for numbness.  Psychiatric/Behavioral: Positive for sleep disturbance.    Filed Vitals:   01/23/15 1508  BP: 142/82  Pulse: 108  Temp: 99 F (37.2 C)  TempSrc: Oral  Resp: 20  Height: 5' 4"  (1.626 m)  Weight: 184 lb 6.4 oz (83.643 kg)  SpO2: 99%   Body mass index is 31.64 kg/(m^2).  Physical Exam  Constitutional: She is oriented to person, place, and time. She appears well-developed and well-nourished. No distress.  Looks uncomfortable. Sitting on exam table. Uses w/c for ambulation  Cardiovascular:  +1 pitting LE edema b/l. No calf TTP  Musculoskeletal: She exhibits edema and tenderness.       Right shoulder: She exhibits decreased range of motion, tenderness and swelling.       Left wrist: She exhibits decreased range of motion, tenderness, swelling (with increased warmth) and deformity. She exhibits no bony tenderness and no effusion.       Right hip: She exhibits decreased range of motion (flexion and external rotation), decreased strength, tenderness and swelling (with increased warmth). She exhibits no bony tenderness, no crepitus and no deformity.       Left hip: She exhibits tenderness and swelling (with min increased warmth). She exhibits normal range of motion, normal strength and no crepitus.        Right knee: She exhibits decreased range of motion, swelling (with increased warmth) and deformity.       Left knee: She exhibits decreased range of motion, swelling (with increased warmth) and deformity. She exhibits no erythema. Tenderness found.       Back:  (+) short left leg; (+) SLR; (+) R>L ASIS TTP  Neurological: She is alert and oriented to person, place, and time.  Skin: Skin is dry. No rash noted.  Psychiatric: She has a normal mood and affect. Her behavior is normal.     Labs reviewed: Office Visit on 11/27/2014  Component Date Value Ref Range Status  . Hgb A1c MFr Bld 11/27/2014 6.1* 4.8 - 5.6 % Final   Comment:          Pre-diabetes: 5.7 - 6.4          Diabetes: >6.4          Glycemic control for adults with diabetes: <7.0   . Est. average glucose Bld gHb Est-m* 11/27/2014 128   Final  Admission on 11/21/2014, Discharged on 11/21/2014  Component Date Value Ref Range Status  . Color, Urine 11/21/2014 YELLOW  YELLOW Final  . APPearance 11/21/2014 CLEAR  CLEAR Final  . Specific Gravity, Urine 11/21/2014 1.012  1.005 - 1.030 Final  . pH 11/21/2014 6.5  5.0 - 8.0 Final  . Glucose, UA 11/21/2014 NEGATIVE  NEGATIVE mg/dL Final  . Hgb urine dipstick 11/21/2014 NEGATIVE  NEGATIVE Final  . Bilirubin Urine 11/21/2014 NEGATIVE  NEGATIVE Final  . Ketones, ur 11/21/2014 NEGATIVE  NEGATIVE mg/dL Final  . Protein, ur 11/21/2014 NEGATIVE  NEGATIVE mg/dL Final  .  Urobilinogen, UA 11/21/2014 0.2  0.0 - 1.0 mg/dL Final  . Nitrite 11/21/2014 NEGATIVE  NEGATIVE Final  . Leukocytes, UA 11/21/2014 NEGATIVE  NEGATIVE Final   MICROSCOPIC NOT DONE ON URINES WITH NEGATIVE PROTEIN, BLOOD, LEUKOCYTES, NITRITE, OR GLUCOSE <1000 mg/dL.  . WBC 11/21/2014 8.4  4.0 - 10.5 K/uL Final  . RBC 11/21/2014 3.42* 3.87 - 5.11 MIL/uL Final  . Hemoglobin 11/21/2014 10.0* 12.0 - 15.0 g/dL Final  . HCT 11/21/2014 30.9* 36.0 - 46.0 % Final  . MCV 11/21/2014 90.4  78.0 - 100.0 fL Final  . MCH 11/21/2014  29.2  26.0 - 34.0 pg Final  . MCHC 11/21/2014 32.4  30.0 - 36.0 g/dL Final  . RDW 11/21/2014 18.1* 11.5 - 15.5 % Final  . Platelets 11/21/2014 413* 150 - 400 K/uL Final  . Neutrophils Relative % 11/21/2014 74  43 - 77 % Final  . Neutro Abs 11/21/2014 6.3  1.7 - 7.7 K/uL Final  . Lymphocytes Relative 11/21/2014 19  12 - 46 % Final  . Lymphs Abs 11/21/2014 1.6  0.7 - 4.0 K/uL Final  . Monocytes Relative 11/21/2014 4  3 - 12 % Final  . Monocytes Absolute 11/21/2014 0.4  0.1 - 1.0 K/uL Final  . Eosinophils Relative 11/21/2014 3  0 - 5 % Final  . Eosinophils Absolute 11/21/2014 0.2  0.0 - 0.7 K/uL Final  . Basophils Relative 11/21/2014 0  0 - 1 % Final  . Basophils Absolute 11/21/2014 0.0  0.0 - 0.1 K/uL Final  . Sodium 11/21/2014 134* 135 - 145 mmol/L Final   Please note change in reference range.  . Potassium 11/21/2014 4.1  3.5 - 5.1 mmol/L Final   Please note change in reference range.  . Chloride 11/21/2014 101  96 - 112 mEq/L Final  . CO2 11/21/2014 25  19 - 32 mmol/L Final  . Glucose, Bld 11/21/2014 117* 70 - 99 mg/dL Final  . BUN 11/21/2014 20  6 - 23 mg/dL Final  . Creatinine, Ser 11/21/2014 0.77  0.50 - 1.10 mg/dL Final  . Calcium 11/21/2014 9.4  8.4 - 10.5 mg/dL Final  . GFR calc non Af Amer 11/21/2014 76* >90 mL/min Final  . GFR calc Af Amer 11/21/2014 88* >90 mL/min Final   Comment: (NOTE) The eGFR has been calculated using the CKD EPI equation. This calculation has not been validated in all clinical situations. eGFR's persistently <90 mL/min signify possible Chronic Kidney Disease.   . Anion gap 11/21/2014 8  5 - 15 Final  . Glucose-Capillary 11/21/2014 108* 70 - 99 mg/dL Final     Assessment/Plan    ICD-9-CM ICD-10-CM   1. Pain in joint, pelvic region and thigh, unspecified laterality - possible RA flare vs gout attack 719.45 M25.559 Urinalysis, dipstick only     predniSONE (DELTASONE) 10 MG tablet     DG Pelvis 1-2 Views   R>L  2. Rheumatoid arthritis - possible  flare 714.0 M06.9 predniSONE (DELTASONE) 10 MG tablet     DG Pelvis 1-2 Views  3. Idiopathic gout, unspecified chronicity, unspecified site - possible flare 274.9 M10.00   4. Type 2 diabetes mellitus with other diabetic kidney complication - controlled on metformin 250.40 E11.29   5. Essential hypertension - stable on 4 meds 401.9 I10    --attempted to reach Dr Patrecia Pour but line remained busy. Suspect RA flare +/- gout flare. She has a low grade temp that could be due to a flare. She was unable to give a urine  sample for dipstick today. Rx prednisone taper. Will resume 55m Prednisone once taper complete. She has an appt with Rheum next week that she will need to keep.  --continue all other medications as rx  --f/u as scheduled. RTO if no better.  Madonna Flegal S. CPerlie Gold PMetair[facility] [address] [phone] (Wednesdays and Fridays 8 AM - 5 PM) ([phone] (Monday-Friday 8 AM - 5 PM)

## 2015-01-23 NOTE — Patient Instructions (Signed)
Follow up with rheumatology as scheduled  Take prednisone taper and resume 5mg  dose when done.  Follow up for pelvic xrays as soon as possible  Keep appt with Dr as scheduled

## 2015-01-24 ENCOUNTER — Ambulatory Visit
Admission: RE | Admit: 2015-01-24 | Discharge: 2015-01-24 | Disposition: A | Discharge status: Home / Self Care | Payer: Medicare Other | Source: Ambulatory Visit | Attending: Internal Medicine | Admitting: Internal Medicine

## 2015-01-24 ENCOUNTER — Encounter: Payer: Self-pay | Admitting: Internal Medicine

## 2015-01-24 DIAGNOSIS — M069 Rheumatoid arthritis, unspecified: Secondary | ICD-10-CM

## 2015-01-24 DIAGNOSIS — M25559 Pain in unspecified hip: Secondary | ICD-10-CM

## 2015-01-29 ENCOUNTER — Other Ambulatory Visit: Payer: Self-pay | Admitting: *Deleted

## 2015-01-29 MED ORDER — TRAMADOL HCL 50 MG PO TABS: ORAL_TABLET | ORAL | Status: DC

## 2015-01-29 NOTE — Telephone Encounter (Signed)
[facility] 

## 2015-02-25 ENCOUNTER — Other Ambulatory Visit: Payer: Self-pay | Admitting: *Deleted

## 2015-02-25 MED ORDER — PREDNISONE 5 MG PO TABS: 5.0000 mg | ORAL_TABLET | Freq: Every day | ORAL | Status: DC

## 2015-02-25 NOTE — Telephone Encounter (Signed)
[facility] 

## 2015-02-26 ENCOUNTER — Ambulatory Visit: Payer: Medicare Other | Admitting: Internal Medicine

## 2015-03-06 ENCOUNTER — Encounter: Payer: Self-pay | Admitting: Internal Medicine

## 2015-03-06 ENCOUNTER — Ambulatory Visit: Payer: Medicare Other | Admitting: Internal Medicine

## 2015-03-11 ENCOUNTER — Other Ambulatory Visit: Payer: Self-pay | Admitting: *Deleted

## 2015-03-11 MED ORDER — PREDNISONE 5 MG PO TABS: 5.0000 mg | ORAL_TABLET | Freq: Every day | ORAL | Status: DC

## 2015-03-11 NOTE — Telephone Encounter (Signed)
[facility] 

## 2015-03-19 ENCOUNTER — Other Ambulatory Visit: Payer: Self-pay | Admitting: Internal Medicine

## 2015-03-22 ENCOUNTER — Encounter: Payer: Self-pay | Admitting: Internal Medicine

## 2015-03-22 ENCOUNTER — Ambulatory Visit (INDEPENDENT_AMBULATORY_CARE_PROVIDER_SITE_OTHER): Payer: Medicare Other | Admitting: Internal Medicine

## 2015-03-22 VITALS — BP 172/94 | HR 59 | Temp 98.0°F | Resp 20 | Ht 64.0 in | Wt 148.6 lb

## 2015-03-22 DIAGNOSIS — I10 Essential (primary) hypertension: Secondary | ICD-10-CM | POA: Diagnosis not present

## 2015-03-22 DIAGNOSIS — E1129 Type 2 diabetes mellitus with other diabetic kidney complication: Secondary | ICD-10-CM

## 2015-03-22 DIAGNOSIS — R05 Cough: Secondary | ICD-10-CM | POA: Diagnosis not present

## 2015-03-22 DIAGNOSIS — M069 Rheumatoid arthritis, unspecified: Secondary | ICD-10-CM

## 2015-03-22 DIAGNOSIS — R059 Cough, unspecified: Secondary | ICD-10-CM

## 2015-03-22 DIAGNOSIS — Z5181 Encounter for therapeutic drug level monitoring: Secondary | ICD-10-CM

## 2015-03-22 DIAGNOSIS — M797 Fibromyalgia: Secondary | ICD-10-CM | POA: Diagnosis not present

## 2015-03-22 MED ORDER — PREDNISONE 5 MG PO TABS: 5.0000 mg | ORAL_TABLET | Freq: Every day | ORAL | Status: DC

## 2015-03-22 MED ORDER — OXYCODONE HCL 5 MG PO TABS: 5.0000 mg | ORAL_TABLET | ORAL | Status: DC | PRN

## 2015-03-22 MED ORDER — AMOXICILLIN-POT CLAVULANATE 875-125 MG PO TABS: 1.0000 | ORAL_TABLET | Freq: Two times a day (BID) | ORAL | Status: DC

## 2015-03-22 NOTE — Progress Notes (Signed)
Patient ID: [name] [name], female   DOB: 19-Jun-1930, 81 y.o.   MRN: [id number]    Location:    [facility]   Place of Service:   OFFICE   Chief Complaint  Patient presents with  . Follow-up    routine visit    HPI:  81 yo female seen today for f/u arthralgias and HTN. She reports increased coughing x 2 days and also has a lot of joint pain due to rainy weather. She saw saw Rheum and was started on Humira. Has had 2 doses thus far. Still takes prednisone 5mg  and MTX. Continues to have severe pain  She does not take anything for seasonal allergy  SBP at home 140-150s. hydralazine causes drowsiness when taken as directed  Past Medical History  Diagnosis Date  . Hypertension   . First degree atrioventricular block   . Onychia and paronychia of toe   . Muscle weakness (generalized)   . Allergic rhinitis due to pollen 04/27/2007  . Gout, unspecified 04/19/2013  . Disorder of bone and cartilage, unspecified   . Edema 05/03/2013  . Hyperlipidemia 04/27/2007  . Unspecified essential hypertension 08/08/2013  . Coronary atherosclerosis of native coronary artery   . Other malaise and fatigue   . Cough   . Other fall   . Unspecified vitamin D deficiency   . TIA (transient ischemic attack)     "a few one summer" (08/07/2013)  . Myocardial infarction ~ 1970  . Exertional shortness of breath     "sometimes" (08/07/2013)  . Type II diabetes mellitus     "fasting 90-110s" (08/07/2013)  . Osteoarthrosis, unspecified whether generalized or localized, unspecified site 04/27/2007  . Prepatellar bursitis   . Arthritis     "in q joint" (08/07/2013)  . Stroke 01/17/2014  . Seizures   . Muscle spasm     Past Surgical History  Procedure Laterality Date  . Tonsillectomy  08/1974  . Replacement total knee Right 09/2005  . Shoulder open rotator cuff repair Right 07/1999  . Appendectomy  1960  . Abdominal hysterectomy  04/1980  . Transthoracic echocardiogram  2003    EF 55-65%; mild concentric LVH   . Joint replacement    . Total knee arthroplasty Left 08/07/2013  . Cataract extraction w/ intraocular lens  implant, bilateral Bilateral ~ 2012  . Total knee arthroplasty Left 08/07/2013    Procedure: TOTAL KNEE ARTHROPLASTY- LEFT;  Surgeon: Dannielle Huh, MD;  Location: [facility];  Service: Orthopedics;  Laterality: Left;  . Cardiac catheterization  2003    Patient Care Team: Kirt Boys, DO as PCP - General (Internal Medicine)  History   Social History  . Marital Status: Widowed    Spouse Name: N/A  . Number of Children: N/A  . Years of Education: N/A   Occupational History  . Not on file.   Social History Main Topics  . Smoking status: Never Smoker   . Smokeless tobacco: Never Used  . Alcohol Use: No  . Drug Use: No  . Sexual Activity: No   Other Topics Concern  . Not on file   Social History Narrative   Widowed   Walks with cane     reports that she has never smoked. She has never used smokeless tobacco. She reports that she does not drink alcohol or use illicit drugs.  Allergies  Allergen Reactions  . Cetirizine Hcl Other (See Comments)    unknown  . Codeine Other (See Comments)    unknown  .  Fish Allergy   . Indomethacin Other (See Comments)    unknown  . Lyrica [Pregabalin]     hallucinations  . Methyldopa Other (See Comments)    unknown  . Shellfish Allergy Hives  . Tomato Rash    Medications: [name] Medications  New Prescriptions   AMOXICILLIN-CLAVULANATE (AUGMENTIN) 875-125 MG PER TABLET    Take 1 tablet by mouth 2 (two) times daily.   OXYCODONE (OXY IR/ROXICODONE) 5 MG IMMEDIATE RELEASE TABLET    Take 1 tablet (5 mg total) by mouth every 4 (four) hours as needed for severe pain.  Previous Medications   ACETAMINOPHEN (TYLENOL) 500 MG TABLET    Take 500 mg by mouth every 6 (six) hours as needed for moderate pain.   ADALIMUMAB 40 MG/0.8ML PSKT    Inject into the skin. Take 0.8 ml every two weeks   ALBUTEROL (PROVENTIL HFA;VENTOLIN HFA) 108 (90  BASE) MCG/ACT INHALER    Inhale 2 puffs into the lungs every 6 (six) hours as needed for wheezing.   ALLOPURINOL (ZYLOPRIM) 100 MG TABLET    TAKE 1 TABLET BY MOUTH EVERY DAY FOR GOUT   ASPIRIN 81 MG TABLET    Take 81 mg by mouth daily.   CALCIUM-VITAMIN D (OSCAL WITH D) 500-200 MG-UNIT PER TABLET    Take 1 tablet by mouth 2 (two) times daily.   CHOLECALCIFEROL (VITAMIN D) 1000 UNITS TABLET    Take 1,000 Units by mouth daily.    CLONIDINE (CATAPRES) 0.1 MG TABLET    Take 0.1 mg by mouth daily as needed (blood presure is above 160). For SBP>160   FERROUS SULFATE 325 (65 FE) MG TABLET    Take one tablet by mouth three times daily for anemia   FOLIC ACID (FOLVITE) 1 MG TABLET    Take 2 mg by mouth daily.    GLUCOSE BLOOD (ONE TOUCH ULTRA TEST) TEST STRIP    Check blood sugar once daily DX 250.00   HYDRALAZINE (APRESOLINE) 50 MG TABLET    Take one tablet by mouth in the morning and afternoon and One and half tablet in the evening for blood pressure   LABETALOL (NORMODYNE) 100 MG TABLET    Take 2 tablets (200 mg total) by mouth 2 (two) times daily.   LISINOPRIL (PRINIVIL,ZESTRIL) 5 MG TABLET    take 1 tablet by mouth once daily TO PROTECT KIDNEYS   METFORMIN (GLUCOPHAGE) 500 MG TABLET    TAKE 1 TABLET BY MOUTH EVERY DAY FOR DIABETES   METHOTREXATE (RHEUMATREX) 2.5 MG TABLET    Provided by rheumatology- started on 4 tablets every week for 2 weeks, then 6 tab every week for 2 weeks, then 8 tab every week   OVER THE COUNTER MEDICATION    Take 1 tablet by mouth 2 (two) times daily. Wellness formula for immune support   POLYVINYL ALCOHOL (LIQUIFILM TEARS) 1.4 % OPHTHALMIC SOLUTION    Place 1 drop into both eyes as needed (for dry eyes).   SENNA (SENOKOT) 8.6 MG TABS TABLET    Take one tablet by mouth once daily for constipation   TRAMADOL (ULTRAM) 50 MG TABLET    take 1 to 2 tablets by mouth every 6 hours if needed for MODERATE PAIN   TRIAMCINOLONE CREAM (KENALOG) 0.1 %    APPLY TO AFFECTED AREA TWICE DAILY  AS NEEDED   VITAMIN C (ASCORBIC ACID) 500 MG TABLET    Take 500 mg by mouth daily.   VOLTAREN 1 % GEL  APPLY TO AFFECTED AREA 3-4 TIMES DAILY  Modified Medications   Modified Medication Previous Medication   PREDNISONE (DELTASONE) 5 MG TABLET predniSONE (DELTASONE) 5 MG tablet      Take 1 tablet (5 mg total) by mouth daily before breakfast.    Take 1 tablet (5 mg total) by mouth daily before breakfast.  Discontinued Medications   METHOCARBAMOL (ROBAXIN) 500 MG TABLET    Take one tablet by mouth every 8 hours as needed for muscle spasms.   TRIAMCINOLONE CREAM (KENALOG) 0.1 %    APPLY TO AFFECTED AREA TWICE DAILY AS NEEDED    Review of Systems  Constitutional: Positive for fatigue. Negative for fever, chills, diaphoresis, activity change and appetite change.  HENT: Negative for ear pain and sore throat.   Eyes: Negative for visual disturbance.  Respiratory: Negative for cough, chest tightness and shortness of breath.   Cardiovascular: Positive for leg swelling. Negative for chest pain and palpitations.  Gastrointestinal: Negative for nausea, vomiting, abdominal pain, diarrhea, constipation and blood in stool.  Genitourinary: Negative for dysuria.  Musculoskeletal: Positive for myalgias, back pain, joint swelling, arthralgias and gait problem.  Skin: Negative for rash.  Neurological: Negative for dizziness, tremors, numbness and headaches.  Psychiatric/Behavioral: Negative for sleep disturbance. The patient is not nervous/anxious.     Filed Vitals:   03/22/15 1529  BP: 172/94  Pulse: 59  Temp: 98 F (36.7 C)  TempSrc: Oral  Resp: 20  Height: 5\' 4"  (1.626 m)  Weight: 148 lb 9.6 oz (67.405 kg)  SpO2: 97%   Body mass index is 25.49 kg/(m^2).  Physical Exam  Constitutional: She is oriented to person, place, and time. She appears well-developed and well-nourished.  HENT:  Mouth/Throat: Oropharynx is clear and moist. No oropharyngeal exudate.  Eyes: Pupils are equal, round, and  reactive to light. No scleral icterus.  Neck: Neck supple. No tracheal deviation present. No thyromegaly present.  Cardiovascular: Normal rate, regular rhythm, normal heart sounds and intact distal pulses.  Exam reveals no gallop and no friction rub.   No murmur heard. L>R LE edema +1 pitting. no calf TTP. No carotid bruit b/l  Pulmonary/Chest: Effort normal and breath sounds normal. No stridor. No respiratory distress. She has no wheezes. She has no rales.  Abdominal: Soft. Bowel sounds are normal. She exhibits no distension and no mass. There is no tenderness. There is no rebound and no guarding.  Musculoskeletal: She exhibits edema and tenderness.  Unstable gait. Multiple small and large joint deformities  Lymphadenopathy:    She has no cervical adenopathy.  Neurological: She is alert and oriented to person, place, and time. She has normal reflexes.  Skin: Skin is warm and dry. No rash noted.  Psychiatric: She has a normal mood and affect. Her behavior is normal. Thought content normal.     Labs reviewed: No visits with results within 3 Month(s) from this visit. Latest known visit with results is:  Office Visit on 11/27/2014  Component Date Value Ref Range Status  . Hgb A1c MFr Bld 11/27/2014 6.1* 4.8 - 5.6 % Final   Comment:          Pre-diabetes: 5.7 - 6.4          Diabetes: >6.4          Glycemic control for adults with diabetes: <7.0   . Est. average glucose Bld gHb Est-m* 11/27/2014 128   Final    No results found.   Assessment/Plan   ICD-9-CM ICD-10-CM  1. Rheumatoid arthritis - cont Humira and MTX; pain uncontrolled 714.0 M06.9   2. Cough - with probable URI 786.2 R05   3. Fibromyalgia - cont meds 729.1 M79.7   4. Essential hypertension - stable; cont meds 401.9 I10   5. Encounter for therapeutic drug monitoring V58.83 Z51.81   6. Type 2 diabetes mellitus with other diabetic kidney complication - cont meds 250.40 E11.29     --check A1c at next visit  --empiric  abx due to humira/ MTX use  --will add OxyIR prn breakthrough pain. If tolerated, will consider 30day supply and will need pain contract. Side effects discussed  --Check blood pressure daily at home. Call office if remains >150/90  --Continue current medications as ordered  --Follow up with Dr Corliss Skains as scheduled  --Drink plenty of fluids and rest  --Eat yogurt daily to restore colon flora while taking augmentin (antibiotic)  --May take plain claritin, allegra or zyrtec daily for seasonal allergy  --Follow up in 3 mos  [name] [name]  [facility] [address] [place], [place] [address] [phone] Cell (Monday-Friday 8 AM - 5 PM) [phone] After 5 PM and follow prompts

## 2015-03-22 NOTE — Patient Instructions (Addendum)
Check blood pressure daily at home. Call office if remains >150/90  Continue current medications as ordered  Follow up with Dr Corliss Skains as scheduled  Drink plenty of fluids and rest  Eat yogurt daily to restore colon flora while taking augmentin (antibiotic)  May take plain claritin, allegra or zyrtec daily for seasonal allergy  Follow up in 3 mos

## 2015-03-27 ENCOUNTER — Encounter (HOSPITAL_COMMUNITY): Payer: Self-pay

## 2015-03-27 ENCOUNTER — Emergency Department (HOSPITAL_COMMUNITY): Payer: Medicare Other

## 2015-03-27 ENCOUNTER — Telehealth: Payer: Self-pay | Admitting: *Deleted

## 2015-03-27 ENCOUNTER — Emergency Department (HOSPITAL_COMMUNITY)
Admission: EM | Admit: 2015-03-27 | Discharge: 2015-03-27 | Disposition: A | Discharge status: Home / Self Care | Payer: Medicare Other | Attending: Emergency Medicine | Admitting: Emergency Medicine

## 2015-03-27 DIAGNOSIS — I2510 Atherosclerotic heart disease of native coronary artery without angina pectoris: Secondary | ICD-10-CM | POA: Diagnosis not present

## 2015-03-27 DIAGNOSIS — Z8673 Personal history of transient ischemic attack (TIA), and cerebral infarction without residual deficits: Secondary | ICD-10-CM | POA: Diagnosis not present

## 2015-03-27 DIAGNOSIS — M79602 Pain in left arm: Secondary | ICD-10-CM | POA: Diagnosis not present

## 2015-03-27 DIAGNOSIS — I252 Old myocardial infarction: Secondary | ICD-10-CM | POA: Insufficient documentation

## 2015-03-27 DIAGNOSIS — R531 Weakness: Secondary | ICD-10-CM | POA: Insufficient documentation

## 2015-03-27 DIAGNOSIS — M109 Gout, unspecified: Secondary | ICD-10-CM | POA: Diagnosis not present

## 2015-03-27 DIAGNOSIS — Z792 Long term (current) use of antibiotics: Secondary | ICD-10-CM | POA: Insufficient documentation

## 2015-03-27 DIAGNOSIS — R42 Dizziness and giddiness: Secondary | ICD-10-CM | POA: Insufficient documentation

## 2015-03-27 DIAGNOSIS — Z79899 Other long term (current) drug therapy: Secondary | ICD-10-CM | POA: Diagnosis not present

## 2015-03-27 DIAGNOSIS — Z7982 Long term (current) use of aspirin: Secondary | ICD-10-CM | POA: Diagnosis not present

## 2015-03-27 DIAGNOSIS — M1990 Unspecified osteoarthritis, unspecified site: Secondary | ICD-10-CM | POA: Diagnosis not present

## 2015-03-27 DIAGNOSIS — I10 Essential (primary) hypertension: Secondary | ICD-10-CM | POA: Insufficient documentation

## 2015-03-27 DIAGNOSIS — E785 Hyperlipidemia, unspecified: Secondary | ICD-10-CM | POA: Diagnosis not present

## 2015-03-27 LAB — CBC WITH DIFFERENTIAL/PLATELET
Basophils Absolute: 0 10*3/uL (ref 0.0–0.1)
Basophils Relative: 0 % (ref 0–1)
Eosinophils Absolute: 0.3 10*3/uL (ref 0.0–0.7)
Eosinophils Relative: 2 % (ref 0–5)
HCT: 31.9 % — ABNORMAL LOW (ref 36.0–46.0)
Hemoglobin: 10.5 g/dL — ABNORMAL LOW (ref 12.0–15.0)
Lymphocytes Relative: 10 % — ABNORMAL LOW (ref 12–46)
Lymphs Abs: 1.1 10*3/uL (ref 0.7–4.0)
MCH: 30.4 pg (ref 26.0–34.0)
MCHC: 32.9 g/dL (ref 30.0–36.0)
MCV: 92.5 fL (ref 78.0–100.0)
Monocytes Absolute: 0.9 10*3/uL (ref 0.1–1.0)
Monocytes Relative: 8 % (ref 3–12)
Neutro Abs: 8.8 10*3/uL — ABNORMAL HIGH (ref 1.7–7.7)
Neutrophils Relative %: 80 % — ABNORMAL HIGH (ref 43–77)
Platelets: 301 10*3/uL (ref 150–400)
RBC: 3.45 MIL/uL — ABNORMAL LOW (ref 3.87–5.11)
RDW: 17.9 % — ABNORMAL HIGH (ref 11.5–15.5)
WBC: 11 10*3/uL — ABNORMAL HIGH (ref 4.0–10.5)

## 2015-03-27 LAB — RAPID URINE DRUG SCREEN, HOSP PERFORMED
Amphetamines: NOT DETECTED
Barbiturates: NOT DETECTED
Benzodiazepines: NOT DETECTED
Cocaine: NOT DETECTED
Opiates: NOT DETECTED
Tetrahydrocannabinol: NOT DETECTED

## 2015-03-27 LAB — URINALYSIS, ROUTINE W REFLEX MICROSCOPIC
Bilirubin Urine: NEGATIVE
Glucose, UA: NEGATIVE mg/dL
Hgb urine dipstick: NEGATIVE
Ketones, ur: NEGATIVE mg/dL
Leukocytes, UA: NEGATIVE
Nitrite: NEGATIVE
Protein, ur: NEGATIVE mg/dL
Specific Gravity, Urine: 1.011 (ref 1.005–1.030)
Urobilinogen, UA: 0.2 mg/dL (ref 0.0–1.0)
pH: 7.5 (ref 5.0–8.0)

## 2015-03-27 LAB — COMPREHENSIVE METABOLIC PANEL
ALT: 12 U/L — ABNORMAL LOW (ref 14–54)
AST: 18 U/L (ref 15–41)
Albumin: 3.6 g/dL (ref 3.5–5.0)
Alkaline Phosphatase: 54 U/L (ref 38–126)
Anion gap: 11 (ref 5–15)
BUN: 18 mg/dL (ref 6–20)
CO2: 24 mmol/L (ref 22–32)
Calcium: 9.6 mg/dL (ref 8.9–10.3)
Chloride: 102 mmol/L (ref 101–111)
Creatinine, Ser: 0.79 mg/dL (ref 0.44–1.00)
GFR calc Af Amer: >60 mL/min (ref >60)
GFR calc non Af Amer: >60 mL/min (ref >60)
Glucose, Bld: 145 mg/dL — ABNORMAL HIGH (ref 65–99)
Potassium: 3.9 mmol/L (ref 3.5–5.1)
Sodium: 137 mmol/L (ref 135–145)
Total Bilirubin: 0.7 mg/dL (ref 0.3–1.2)
Total Protein: 7.2 g/dL (ref 6.5–8.1)

## 2015-03-27 LAB — ETHANOL: Alcohol, Ethyl (B): <5 mg/dL (ref <5)

## 2015-03-27 LAB — TROPONIN I: Troponin I: <0.03 ng/mL (ref <0.031)

## 2015-03-27 LAB — APTT: aPTT: 26 seconds (ref 24–37)

## 2015-03-27 MED ORDER — SODIUM CHLORIDE 0.9 % IV SOLN
100.0000 mL/h | INTRAVENOUS | Status: DC
  Administered 2015-03-27: 100 mL/h via INTRAVENOUS

## 2015-03-27 MED ORDER — FENTANYL CITRATE (PF) 100 MCG/2ML IJ SOLN
25.0000 ug | Freq: Once | INTRAMUSCULAR | Status: AC
  Administered 2015-03-27: 25 ug via INTRAVENOUS
  Filled 2015-03-27: qty 2

## 2015-03-27 MED ORDER — SODIUM CHLORIDE 0.9 % IV BOLUS (SEPSIS)
500.0000 mL | Freq: Once | INTRAVENOUS | Status: AC
  Administered 2015-03-27: 500 mL via INTRAVENOUS

## 2015-03-27 MED ORDER — HYDROCODONE-ACETAMINOPHEN 5-325 MG PO TABS: 1.0000 | ORAL_TABLET | ORAL | Status: DC | PRN

## 2015-03-27 NOTE — Progress Notes (Signed)
CM left a voice message for pt at [phone] (H)

## 2015-03-27 NOTE — ED Notes (Signed)
Per [facility] pt from home. Pt presents with pain starting in her Left hand radiating up into her Left arm and into her head, pt also reports hx of RA and symptoms are similar pain is more intense with this episode. Pt reports waking with dizziness and chills that subsided after having a BM this am. Stroke screen negative and 12 lead unremarkable. VSS CBG 174, BP 194/98, HR 93, 95% RA

## 2015-03-27 NOTE — ED Notes (Signed)
Patient transported to CT 

## 2015-03-27 NOTE — ED Notes (Addendum)
Pt presents with with "extreme pain" to her Left hand radiating up her arm and into her head. Pt states her symptoms gradually worsened over the last two day. Pt does feel this is her RA pain but just at a scale she is unable to control at home. Pt also reports having chills.   Pt is unable to move her Left arm with out assistance of her Right hand. Pt alert and oriented x4, no slurred speech or facial droop noted   Pt denies chest pain, SOB, nausea, vomiting, cough, congestion, or fever. Pt does endorse an episode of dizziness this am that subsided after a BM.

## 2015-03-27 NOTE — ED Notes (Signed)
Patient was educated not to drive, operate heavy machinery, or drink alcohol while taking narcotic medication.  

## 2015-03-27 NOTE — ED Provider Notes (Signed)
CSN: [id number]     Arrival date & time 03/27/15  1029 History   First MD Initiated Contact with Patient 03/27/15 1032     Chief Complaint  Patient presents with  . Weakness  . Headache  . Arm Pain     (Consider location/radiation/quality/duration/timing/severity/associated sxs/prior Treatment) HPI Patient presents with concern of pain, weakness in the left arm. Symptoms seem to have begun when the patient awoke. Last normal time was yesterday, at least 12 hours ago. Since onset patient has had weakness throughout the left upper extremity, as well as burning sensation throughout the hand. Patient can only move the left arm with the assistance of her right hand. She denies speech difficulty, facial asymmetry, confusion, disorientation. She states the pain is somewhat similar to prior episodes of rheumatoid arthritis, but different, with more substantial weakness. She was well prior to last night. Patient notes that she switched her medication 2 weeks ago for her rheumatoid arthritis, otherwise has been taking similar medication for years, with no new diet changes, activity changes.  Past Medical History  Diagnosis Date  . Hypertension   . First degree atrioventricular block   . Onychia and paronychia of toe   . Muscle weakness (generalized)   . Allergic rhinitis due to pollen 04/27/2007  . Gout, unspecified 04/19/2013  . Disorder of bone and cartilage, unspecified   . Edema 05/03/2013  . Hyperlipidemia 04/27/2007  . Unspecified essential hypertension 08/08/2013  . Coronary atherosclerosis of native coronary artery   . Other malaise and fatigue   . Cough   . Other fall   . Unspecified vitamin D deficiency   . TIA (transient ischemic attack)     "a few one summer" (08/07/2013)  . Myocardial infarction ~ 1970  . Exertional shortness of breath     "sometimes" (08/07/2013)  . Type II diabetes mellitus     "fasting 90-110s" (08/07/2013)  . Osteoarthrosis, unspecified whether  generalized or localized, unspecified site 04/27/2007  . Prepatellar bursitis   . Arthritis     "in q joint" (08/07/2013)  . Stroke 01/17/2014  . Seizures   . Muscle spasm    Past Surgical History  Procedure Laterality Date  . Tonsillectomy  08/1974  . Replacement total knee Right 09/2005  . Shoulder open rotator cuff repair Right 07/1999  . Appendectomy  1960  . Abdominal hysterectomy  04/1980  . Transthoracic echocardiogram  2003    EF 55-65%; mild concentric LVH  . Joint replacement    . Total knee arthroplasty Left 08/07/2013  . Cataract extraction w/ intraocular lens  implant, bilateral Bilateral ~ 2012  . Total knee arthroplasty Left 08/07/2013    Procedure: TOTAL KNEE ARTHROPLASTY- LEFT;  Surgeon: Dannielle Huh, MD;  Location: [facility];  Service: Orthopedics;  Laterality: Left;  . Cardiac catheterization  2003   Family History  Problem Relation Age of Onset  . Heart attack Father    History  Substance Use Topics  . Smoking status: Never Smoker   . Smokeless tobacco: Never Used  . Alcohol Use: No   OB History    No data available     Review of Systems  Constitutional:       Per HPI, otherwise negative  HENT:       Per HPI, otherwise negative  Respiratory:       Per HPI, otherwise negative  Cardiovascular:       Per HPI, otherwise negative  Gastrointestinal: Negative for vomiting.  Endocrine:  Negative aside from HPI  Genitourinary:       Neg aside from HPI   Musculoskeletal:       Per HPI, otherwise negative  Skin: Negative for color change.  Neurological: Positive for weakness. Negative for syncope, facial asymmetry and light-headedness.      Allergies  Cetirizine hcl; Codeine; Fish allergy; Indomethacin; Lyrica; Methyldopa; Shellfish allergy; and Tomato  Home Medications   Prior to Admission medications   Medication Sig Start Date End Date Taking? Authorizing Provider  acetaminophen (TYLENOL) 500 MG tablet Take 500 mg by mouth every 6 (six) hours  as needed for moderate pain.   Yes Historical Provider, MD  Adalimumab 40 MG/0.8ML PSKT Inject into the skin. Take 0.8 ml every two weeks   Yes Historical Provider, MD  albuterol (PROVENTIL HFA;VENTOLIN HFA) 108 (90 BASE) MCG/ACT inhaler Inhale 2 puffs into the lungs every 6 (six) hours as needed for wheezing. 11/27/14  Yes Mahima Pandey, MD  allopurinol (ZYLOPRIM) 100 MG tablet TAKE 1 TABLET BY MOUTH EVERY DAY FOR GOUT Patient taking differently: TAKE 100 MG BY MOUTH EVERY DAY FOR GOUT 12/12/14  Yes Tiffany L Reed, DO  amoxicillin-clavulanate (AUGMENTIN) 875-125 MG per tablet Take 1 tablet by mouth 2 (two) times daily. 03/22/15  Yes Kirt Boys, DO  aspirin 81 MG tablet Take 81 mg by mouth daily.   Yes Historical Provider, MD  calcium-vitamin D (OSCAL WITH D) 500-200 MG-UNIT per tablet Take 1 tablet by mouth 2 (two) times daily.   Yes Historical Provider, MD  cloNIDine (CATAPRES) 0.1 MG tablet Take 0.1 mg by mouth daily as needed (blood presure is above 160). For SBP>160 02/06/14  Yes Mahima Pandey, MD  ferrous sulfate 325 (65 FE) MG tablet Take one tablet by mouth three times daily for anemia Patient taking differently: Take 325 mg by mouth 3 (three) times daily.  07/12/14  Yes Mahima Glade Lloyd, MD  folic acid (FOLVITE) 1 MG tablet Take 2 mg by mouth daily.    Yes Historical Provider, MD  glucose blood (ONE TOUCH ULTRA TEST) test strip Check blood sugar once daily DX 250.00 05/18/14  Yes Tiffany L Reed, DO  hydrALAZINE (APRESOLINE) 50 MG tablet Take 50-75 mg by mouth 2 (two) times daily. Take one tablet by mouth in the morning and afternoon and One and half tablet in the evening for blood pressure   Yes Historical Provider, MD  labetalol (NORMODYNE) 100 MG tablet Take 2 tablets (200 mg total) by mouth 2 (two) times daily. 01/14/15  Yes Kirt Boys, DO  lisinopril (PRINIVIL,ZESTRIL) 5 MG tablet take 1 tablet by mouth once daily TO PROTECT KIDNEYS Patient taking differently: TAKE 1 TABLET BY MOUTH DAILY  10/12/14  Yes Tiffany L Reed, DO  metFORMIN (GLUCOPHAGE) 500 MG tablet TAKE 1 TABLET BY MOUTH EVERY DAY FOR DIABETES Patient taking differently: TAKE 500 MG BY MOUTH DAILY FOR DIABETES 12/12/14  Yes Oneal Grout, MD  methotrexate (RHEUMATREX) 2.5 MG tablet Provided by rheumatology- started on 4 tablets every week for 2 weeks, then 6 tab every week for 2 weeks, then 8 tab every week Patient taking differently: Take 10-20 mg by mouth once a week. started on 4 tablets every week for 2 weeks, then 6 tab every week for 2 weeks, then 8 tab every week. Pt takes on Fridays 06/27/14  Yes Mahima Glade Lloyd, MD  OVER THE COUNTER MEDICATION Take 1 tablet by mouth 2 (two) times daily. Wellness formula for immune support   Yes Historical Provider, MD  polyvinyl alcohol (LIQUIFILM TEARS) 1.4 % ophthalmic solution Place 1 drop into both eyes as needed (for dry eyes).   Yes Historical Provider, MD  predniSONE (DELTASONE) 5 MG tablet Take 1 tablet (5 mg total) by mouth daily before breakfast. 03/22/15  Yes Kirt Boys, DO  senna (SENOKOT) 8.6 MG TABS tablet Take one tablet by mouth once daily for constipation Patient taking differently: Take 1 tablet by mouth daily.  07/12/14  Yes Mahima Pandey, MD  traMADol (ULTRAM) 50 MG tablet take 1 to 2 tablets by mouth every 6 hours if needed for MODERATE PAIN Patient taking differently: TAKE 50-100MG  BY MOUTH EVERY 6 HOURS AS NEEDED FOR MODERATE PAIN 03/19/15  Yes Kimber Relic, MD  triamcinolone cream (KENALOG) 0.1 % APPLY TO AFFECTED AREA TWICE DAILY AS NEEDED Patient taking differently: APPLY TO AFFECTED AREA TWICE DAILY AS NEEDED FOR IRRITATION 08/29/14  Yes Mahima Pandey, MD  vitamin C (ASCORBIC ACID) 500 MG tablet Take 500 mg by mouth daily.   Yes Historical Provider, MD  oxyCODONE (OXY IR/ROXICODONE) 5 MG immediate release tablet Take 1 tablet (5 mg total) by mouth every 4 (four) hours as needed for severe pain. Patient not taking: Reported on 03/27/2015 03/22/15   Kirt Boys,  DO  VOLTAREN 1 % GEL APPLY TO AFFECTED AREA 3-4 TIMES DAILY Patient not taking: Reported on 03/27/2015 01/08/15   Mahima Pandey, MD   BP 158/101 mmHg  Pulse 111  Temp(Src) 98.5 F (36.9 C) (Oral)  Resp 13  Ht 5\' 4"  (1.626 m)  Wt 148 lb (67.132 kg)  BMI 25.39 kg/m2  SpO2 100% Physical Exam  Constitutional: She is oriented to person, place, and time. She appears well-developed and well-nourished. No distress.  HENT:  Head: Normocephalic and atraumatic.  Eyes: Conjunctivae and EOM are normal.  Cardiovascular: Normal rate and regular rhythm.   Pulmonary/Chest: Effort normal and breath sounds normal. No stridor. No respiratory distress.  Abdominal: She exhibits no distension. There is no tenderness.  Musculoskeletal: She exhibits no edema.  Neurological: She is alert and oriented to person, place, and time. No cranial nerve deficit.  Patient can only flex the shoulder with assistance from the contralateral side. She moves the elbow minimally, unclear if this is secondary to pain or weakness. She will not move the wrist or hand. Speech is clear, face is symmetric, oriented 3.  Skin: Skin is warm and dry.  Psychiatric: She has a normal mood and affect.  Nursing note and vitals reviewed.   ED Course  Procedures (including critical care time) Labs Review Labs Reviewed  COMPREHENSIVE METABOLIC PANEL - Abnormal; Notable for the following:    Glucose, Bld 145 (*)    ALT 12 (*)    All other components within normal limits  CBC WITH DIFFERENTIAL/PLATELET - Abnormal; Notable for the following:    WBC 11.0 (*)    RBC 3.45 (*)    Hemoglobin 10.5 (*)    HCT 31.9 (*)    RDW 17.9 (*)    Neutrophils Relative % 80 (*)    Neutro Abs 8.8 (*)    Lymphocytes Relative 10 (*)    All other components within normal limits  URINALYSIS, ROUTINE W REFLEX MICROSCOPIC - Abnormal; Notable for the following:    APPearance CLOUDY (*)    All other components within normal limits  APTT  ETHANOL   TROPONIN I  URINE RAPID DRUG SCREEN (HOSP PERFORMED)    Imaging Review Ct Head Wo Contrast  03/27/2015   CLINICAL  DATA:  Patient presents with pain beginning in LEFT hand radiating to the LEFT arm and into the head. Dizziness and chills.  EXAM: CT HEAD WITHOUT CONTRAST  TECHNIQUE: Contiguous axial images were obtained from the base of the skull through the vertex without intravenous contrast.  COMPARISON:  CT head 01/17/2014. MR head 01/17/2014.  FINDINGS: No evidence for acute infarction, hemorrhage, mass lesion, hydrocephalus, or extra-axial fluid. Normal for age cerebral volume. Hypoattenuation of white matter similar to priors, consistent with chronic microvascular ischemic change. No signs of large vessel occlusion. Calvarium intact. Vascular calcification. No acute sinus fluid. Small BILATERAL mastoid effusions, nonspecific. Negative orbits.  IMPRESSION: Age-related changes as described. No acute intracranial findings. Stable appearance from 2015.   Electronically Signed   By: Davonna Belling [name]   On: 03/27/2015 11:59     EKG Interpretation   Date/Time:  Wednesday Mar 27 2015 11:45:10 EDT Ventricular Rate:  101 PR Interval:  164 QRS Duration: 80 QT Interval:  363 QTC Calculation: 470 R Axis:   11 Text Interpretation:  Sinus tachycardia Ventricular premature complex  Consider right atrial enlargement Sinus tachycardia Artifact Abnormal ekg  Confirmed by Gerhard Munch  MD ([id number]) on 03/27/2015 11:56:19 AM       2:14 PM Patient states that her dizziness has resolved, headache is resolved, and her arm pain is diminished. I discussed all findings with her and her daughter. We discussed the need to follow-up with her rheumatologist  patient presents with concern of new left arm weakness, pain, as well as dizziness, headache. Patient is awake and alert, appropriately interactive, no facial asymmetry, but given the weakness, there are some suspicion for stroke, particularly given her  risk profile.  Scan was was largely reassurings unremarkable, the patient's headache and dizziness resolved, and left arm functionality improved. Patient was afebrile, without other evidence for acute changes in her condition. Given the improvement, patient was discharged stable condition to follow-up with her rheumatologist.    Gerhard Munch, MD 03/27/15 [phone]

## 2015-03-27 NOTE — ED Notes (Signed)
Bed: WA23 Expected date:  Expected time:  Means of arrival:  Comments: EMS 

## 2015-03-27 NOTE — Telephone Encounter (Signed)
Spoke with patient's daughter, she stated that her mother is not feeling well this morning, having pain in her hand and arm with some swelling seen. She also stated that she was having some pain in her head for a couple of days now, the has continued to get worse with dizziness. I spoke with Dr. Lyn Hollingshead and she agreed that she should go to the hospital for a work up.

## 2015-03-27 NOTE — Discharge Instructions (Signed)
As discussed, your evaluation today has been largely reassuring.  But, it is important that you monitor your condition carefully, and do not hesitate to return to the ED if you develop new, or concerning changes in your condition. ? ?Otherwise, please follow-up with your physician for appropriate ongoing care. ? ?

## 2015-03-28 NOTE — Progress Notes (Signed)
ED CM returned a third call and left another voice message at 03/28/15 after phone picked up but no answer on 03/28/15 CM left her CM mobile number for at return call

## 2015-04-10 ENCOUNTER — Other Ambulatory Visit: Payer: Self-pay | Admitting: *Deleted

## 2015-04-10 MED ORDER — TRIAMCINOLONE ACETONIDE 0.1 % EX CREA: TOPICAL_CREAM | CUTANEOUS | Status: DC

## 2015-04-10 NOTE — Telephone Encounter (Signed)
[facility] 

## 2015-04-15 ENCOUNTER — Telehealth: Payer: Self-pay | Admitting: *Deleted

## 2015-04-15 NOTE — Telephone Encounter (Signed)
Ok to watch diet and reduce salt intake. Continue observation and call if readings remain elevated despite changing diet

## 2015-04-15 NOTE — Telephone Encounter (Signed)
Patient Notified and agreed and will make appointment if BP stays high.

## 2015-04-15 NOTE — Telephone Encounter (Signed)
Victorino Dike, Nurse with [facility] called with BP of 169/92 and stated that it has been above perimeter 3 different times now. Nurse spoke with daughter today and recommended to bring patient in earlier to be evaluated and she stated that patient has been eating a lot of thing she shouldn't due to a lot of graduation parties. Daughter stated that she thinks it is her diet and will be back on track. Please Advise.

## 2015-04-17 ENCOUNTER — Telehealth: Payer: Self-pay | Admitting: *Deleted

## 2015-04-17 MED ORDER — ALLOPURINOL 100 MG PO TABS: ORAL_TABLET | ORAL | Status: DC

## 2015-04-18 ENCOUNTER — Telehealth: Payer: Self-pay

## 2015-04-18 NOTE — Telephone Encounter (Signed)
RN from [facility] called to discuss elevated blood pressure readings. Patients B/P today was 182/99, 1 hour later 168/93. Patient also with elevated fasting blood sugar of 221. Victorino Dike- RN notified patients daughter and she was not concerned about elevated B/P reading and stated this is a long-term concern for the patient. Victorino Dike was told to call if B/P over 150/90  Side Notes, Last OV 03/22/15, patient to f/u in 3 months. No scheduled appointment as of today. Please advise

## 2015-04-19 NOTE — Telephone Encounter (Signed)
Increase lisinopril 10mg  #30 take 1 po daily for blood pressure and to protect kidneys. Give 5 RF.  Continue to check BP at home and call office if >150/90. Thanks

## 2015-04-22 MED ORDER — LISINOPRIL 10 MG PO TABS: ORAL_TABLET | ORAL | Status: DC

## 2015-04-22 NOTE — Telephone Encounter (Signed)
I called patient and informed her of the medication change. Faxed to [facility]. Also patient made follow up appointment for 06/21/2015. I also called Victorino Dike, RN and had to leave voice message of medication change. Also called [facility] and left message with man of changes and he stated that he would give the message to patient's nurse.

## 2015-04-24 ENCOUNTER — Ambulatory Visit (INDEPENDENT_AMBULATORY_CARE_PROVIDER_SITE_OTHER): Payer: Medicare Other | Admitting: Podiatry

## 2015-04-24 ENCOUNTER — Encounter: Payer: Self-pay | Admitting: Podiatry

## 2015-04-24 VITALS — BP 162/85 | HR 111 | Ht 64.0 in | Wt 148.0 lb

## 2015-04-24 DIAGNOSIS — M79606 Pain in leg, unspecified: Secondary | ICD-10-CM | POA: Diagnosis not present

## 2015-04-24 DIAGNOSIS — B351 Tinea unguium: Secondary | ICD-10-CM | POA: Insufficient documentation

## 2015-04-24 NOTE — Progress Notes (Signed)
SUBJECTIVE: 81 y.o. year old female accompanied by her daughter presents requesting toe nails trimmed.   REVIEW OF SYSTEMS: A comprehensive review of systems was negative.  OBJECTIVE: DERMATOLOGIC EXAMINATION: Nails: Hypertrophic nails x 10. VASCULAR EXAMINATION OF LOWER LIMBS: Pedal pulses: All pedal pulses are palpable with normal pulsation.  No edema or erythema noted.  NEUROLOGIC EXAMINATION OF THE LOWER LIMBS: All epicritic and tactile sensations grossly intact.  MUSCULOSKELETAL EXAMINATION: No gross deformities.   Assessment: Onychomycosis x 10.   PLAN: All nails debrided.

## 2015-04-24 NOTE — Patient Instructions (Signed)
Seen for hypertrophic nails. All nails debrided. Return in 3 months or as needed.  

## 2015-04-25 ENCOUNTER — Telehealth: Payer: Self-pay | Admitting: *Deleted

## 2015-04-25 NOTE — Telephone Encounter (Signed)
Received fax from [facility] #[phone] Fax: [phone] for certification for Pull up Large #18 and 5 bags. Given to Dr. Montez Morita to review and sign.

## 2015-04-26 ENCOUNTER — Ambulatory Visit: Payer: Medicare Other | Admitting: Podiatry

## 2015-05-02 ENCOUNTER — Other Ambulatory Visit: Payer: Self-pay | Admitting: *Deleted

## 2015-05-02 MED ORDER — LABETALOL HCL 100 MG PO TABS: 200.0000 mg | ORAL_TABLET | Freq: Two times a day (BID) | ORAL | Status: DC

## 2015-05-02 NOTE — Telephone Encounter (Signed)
[facility] 

## 2015-05-17 ENCOUNTER — Other Ambulatory Visit: Payer: Self-pay | Admitting: Internal Medicine

## 2015-05-17 ENCOUNTER — Telehealth: Payer: Self-pay

## 2015-05-17 NOTE — Telephone Encounter (Signed)
Ok to resume tramadol. Not sure why it was taken off med list. Give #120 tabs take 1-2 tabs po q6hrs prn mod pain. No RF

## 2015-05-17 NOTE — Telephone Encounter (Signed)
Please refer to phone note dated 05/17/15. Awaiting response from doctor before refilling

## 2015-05-17 NOTE — Telephone Encounter (Signed)
Called spoke with patient asked her if she is still taking the tramadol she states she is, patient is also taking oxycodone please advise if it is okay to add tramadol back to list  and refill this medication.

## 2015-05-17 NOTE — Telephone Encounter (Signed)
Received okay from Dr. Montez Morita to give medication

## 2015-05-30 ENCOUNTER — Telehealth: Payer: Self-pay

## 2015-05-30 NOTE — Telephone Encounter (Signed)
Patient called stating she has not checked blood sugar in 2 days because the pharmacy is waiting for [place] to send a form that medicare now requires for diabetic supplies.  I called the pahramcy to discuss for we have not received any forms for patient. The pharmacy states they were unable to send for cause they need our fax number. Fax number was given. Awaiting form.  I called patient with a status update

## 2015-06-05 ENCOUNTER — Other Ambulatory Visit: Payer: Self-pay

## 2015-06-05 MED ORDER — METFORMIN HCL 500 MG PO TABS: ORAL_TABLET | ORAL | Status: DC

## 2015-06-05 MED ORDER — LABETALOL HCL 100 MG PO TABS: 200.0000 mg | ORAL_TABLET | Freq: Two times a day (BID) | ORAL | Status: DC

## 2015-06-10 ENCOUNTER — Other Ambulatory Visit: Payer: Self-pay

## 2015-06-10 MED ORDER — ALLOPURINOL 100 MG PO TABS: ORAL_TABLET | ORAL | Status: DC

## 2015-06-13 ENCOUNTER — Other Ambulatory Visit: Payer: Self-pay | Admitting: *Deleted

## 2015-06-13 MED ORDER — OXYCODONE HCL 5 MG PO TABS: 5.0000 mg | ORAL_TABLET | ORAL | Status: DC | PRN

## 2015-06-13 NOTE — Telephone Encounter (Signed)
Daughter called and requested and will pick up

## 2015-06-17 ENCOUNTER — Telehealth: Payer: Self-pay | Admitting: *Deleted

## 2015-06-17 NOTE — Telephone Encounter (Signed)
Patient caregiver called and stated that patient has a chronic cough and Delsym was not working. Patient on a lot of RA medication and rheumatologist told them to to cautious of infection. Caregiver talked with Pharmacist and they recommended to take Diabetic Tussin OTC and they are trying that today. Wants to know if you have any advise.

## 2015-06-18 NOTE — Telephone Encounter (Signed)
She needs to be seen if she is having a cough not relieved with OTC meds.

## 2015-06-18 NOTE — Telephone Encounter (Signed)
Caregiver stated patient's cough is much better. Will follow up at appointment.

## 2015-06-19 ENCOUNTER — Emergency Department (HOSPITAL_COMMUNITY): Payer: Medicare Other

## 2015-06-19 ENCOUNTER — Observation Stay (HOSPITAL_COMMUNITY)
Admission: EM | Admit: 2015-06-19 | Discharge: 2015-06-21 | Disposition: A | Discharge status: Home Health | Payer: Medicare Other | Attending: Internal Medicine | Admitting: Internal Medicine

## 2015-06-19 ENCOUNTER — Encounter (HOSPITAL_COMMUNITY): Payer: Self-pay | Admitting: *Deleted

## 2015-06-19 DIAGNOSIS — M25552 Pain in left hip: Secondary | ICD-10-CM | POA: Diagnosis present

## 2015-06-19 DIAGNOSIS — E1159 Type 2 diabetes mellitus with other circulatory complications: Secondary | ICD-10-CM | POA: Diagnosis present

## 2015-06-19 DIAGNOSIS — Z79899 Other long term (current) drug therapy: Secondary | ICD-10-CM | POA: Insufficient documentation

## 2015-06-19 DIAGNOSIS — I10 Essential (primary) hypertension: Secondary | ICD-10-CM | POA: Insufficient documentation

## 2015-06-19 DIAGNOSIS — Z885 Allergy status to narcotic agent status: Secondary | ICD-10-CM | POA: Diagnosis not present

## 2015-06-19 DIAGNOSIS — Z7982 Long term (current) use of aspirin: Secondary | ICD-10-CM | POA: Insufficient documentation

## 2015-06-19 DIAGNOSIS — M4806 Spinal stenosis, lumbar region: Secondary | ICD-10-CM | POA: Diagnosis not present

## 2015-06-19 DIAGNOSIS — S76392A Other specified injury of muscle, fascia and tendon of the posterior muscle group at thigh level, left thigh, initial encounter: Secondary | ICD-10-CM | POA: Diagnosis present

## 2015-06-19 DIAGNOSIS — E559 Vitamin D deficiency, unspecified: Secondary | ICD-10-CM | POA: Diagnosis not present

## 2015-06-19 DIAGNOSIS — G40909 Epilepsy, unspecified, not intractable, without status epilepticus: Secondary | ICD-10-CM | POA: Insufficient documentation

## 2015-06-19 DIAGNOSIS — Z8673 Personal history of transient ischemic attack (TIA), and cerebral infarction without residual deficits: Secondary | ICD-10-CM | POA: Diagnosis not present

## 2015-06-19 DIAGNOSIS — S76892A Other injury of other specified muscles, fascia and tendons at thigh level, left thigh, initial encounter: Principal | ICD-10-CM | POA: Insufficient documentation

## 2015-06-19 DIAGNOSIS — Z96653 Presence of artificial knee joint, bilateral: Secondary | ICD-10-CM | POA: Insufficient documentation

## 2015-06-19 DIAGNOSIS — M6281 Muscle weakness (generalized): Secondary | ICD-10-CM | POA: Insufficient documentation

## 2015-06-19 DIAGNOSIS — I152 Hypertension secondary to endocrine disorders: Secondary | ICD-10-CM | POA: Diagnosis present

## 2015-06-19 DIAGNOSIS — R29898 Other symptoms and signs involving the musculoskeletal system: Secondary | ICD-10-CM | POA: Insufficient documentation

## 2015-06-19 DIAGNOSIS — Z888 Allergy status to other drugs, medicaments and biological substances status: Secondary | ICD-10-CM | POA: Insufficient documentation

## 2015-06-19 DIAGNOSIS — Z91018 Allergy to other foods: Secondary | ICD-10-CM | POA: Diagnosis not present

## 2015-06-19 DIAGNOSIS — E785 Hyperlipidemia, unspecified: Secondary | ICD-10-CM | POA: Insufficient documentation

## 2015-06-19 DIAGNOSIS — I440 Atrioventricular block, first degree: Secondary | ICD-10-CM | POA: Insufficient documentation

## 2015-06-19 DIAGNOSIS — M069 Rheumatoid arthritis, unspecified: Secondary | ICD-10-CM | POA: Diagnosis not present

## 2015-06-19 DIAGNOSIS — Z91013 Allergy to seafood: Secondary | ICD-10-CM | POA: Insufficient documentation

## 2015-06-19 DIAGNOSIS — E1129 Type 2 diabetes mellitus with other diabetic kidney complication: Secondary | ICD-10-CM | POA: Diagnosis not present

## 2015-06-19 DIAGNOSIS — E1121 Type 2 diabetes mellitus with diabetic nephropathy: Secondary | ICD-10-CM | POA: Diagnosis present

## 2015-06-19 DIAGNOSIS — X58XXXA Exposure to other specified factors, initial encounter: Secondary | ICD-10-CM | POA: Insufficient documentation

## 2015-06-19 DIAGNOSIS — I2510 Atherosclerotic heart disease of native coronary artery without angina pectoris: Secondary | ICD-10-CM | POA: Diagnosis not present

## 2015-06-19 DIAGNOSIS — M109 Gout, unspecified: Secondary | ICD-10-CM | POA: Diagnosis not present

## 2015-06-19 DIAGNOSIS — M1612 Unilateral primary osteoarthritis, left hip: Secondary | ICD-10-CM | POA: Insufficient documentation

## 2015-06-19 DIAGNOSIS — I252 Old myocardial infarction: Secondary | ICD-10-CM | POA: Insufficient documentation

## 2015-06-19 LAB — URINALYSIS, ROUTINE W REFLEX MICROSCOPIC
Bilirubin Urine: NEGATIVE
Glucose, UA: NEGATIVE mg/dL
Hgb urine dipstick: NEGATIVE
Ketones, ur: NEGATIVE mg/dL
Leukocytes, UA: NEGATIVE
Nitrite: NEGATIVE
Protein, ur: NEGATIVE mg/dL
Specific Gravity, Urine: 1.01 (ref 1.005–1.030)
Urobilinogen, UA: 0.2 mg/dL (ref 0.0–1.0)
pH: 6.5 (ref 5.0–8.0)

## 2015-06-19 LAB — BASIC METABOLIC PANEL
Anion gap: 8 (ref 5–15)
BUN: 17 mg/dL (ref 6–20)
CO2: 23 mmol/L (ref 22–32)
Calcium: 9.6 mg/dL (ref 8.9–10.3)
Chloride: 103 mmol/L (ref 101–111)
Creatinine, Ser: 0.81 mg/dL (ref 0.44–1.00)
GFR calc Af Amer: >60 mL/min (ref >60)
GFR calc non Af Amer: >60 mL/min (ref >60)
Glucose, Bld: 104 mg/dL — ABNORMAL HIGH (ref 65–99)
Potassium: 3.8 mmol/L (ref 3.5–5.1)
Sodium: 134 mmol/L — ABNORMAL LOW (ref 135–145)

## 2015-06-19 LAB — CBC WITH DIFFERENTIAL/PLATELET
Basophils Absolute: 0 10*3/uL (ref 0.0–0.1)
Basophils Relative: 0 % (ref 0–1)
Eosinophils Absolute: 0.8 10*3/uL — ABNORMAL HIGH (ref 0.0–0.7)
Eosinophils Relative: 7 % — ABNORMAL HIGH (ref 0–5)
HCT: 32 % — ABNORMAL LOW (ref 36.0–46.0)
Hemoglobin: 10.8 g/dL — ABNORMAL LOW (ref 12.0–15.0)
Lymphocytes Relative: 18 % (ref 12–46)
Lymphs Abs: 1.9 10*3/uL (ref 0.7–4.0)
MCH: 31.8 pg (ref 26.0–34.0)
MCHC: 33.8 g/dL (ref 30.0–36.0)
MCV: 94.1 fL (ref 78.0–100.0)
Monocytes Absolute: 1 10*3/uL (ref 0.1–1.0)
Monocytes Relative: 9 % (ref 3–12)
Neutro Abs: 7.2 10*3/uL (ref 1.7–7.7)
Neutrophils Relative %: 66 % (ref 43–77)
Platelets: 368 10*3/uL (ref 150–400)
RBC: 3.4 MIL/uL — ABNORMAL LOW (ref 3.87–5.11)
RDW: 17.3 % — ABNORMAL HIGH (ref 11.5–15.5)
WBC: 10.9 10*3/uL — ABNORMAL HIGH (ref 4.0–10.5)

## 2015-06-19 MED ORDER — FOLIC ACID 1 MG PO TABS
2.0000 mg | ORAL_TABLET | Freq: Every day | ORAL | Status: DC
  Administered 2015-06-20 – 2015-06-21 (×2): 2 mg via ORAL
  Filled 2015-06-19 (×2): qty 2

## 2015-06-19 MED ORDER — ALLOPURINOL 100 MG PO TABS
100.0000 mg | ORAL_TABLET | Freq: Every day | ORAL | Status: DC
  Administered 2015-06-20 – 2015-06-21 (×2): 100 mg via ORAL
  Filled 2015-06-19 (×2): qty 1

## 2015-06-19 MED ORDER — INSULIN ASPART 100 UNIT/ML ~~LOC~~ SOLN
0.0000 [IU] | SUBCUTANEOUS | Status: DC
  Administered 2015-06-20: 2 [IU] via SUBCUTANEOUS
  Administered 2015-06-20: 1 [IU] via SUBCUTANEOUS
  Administered 2015-06-20: 2 [IU] via SUBCUTANEOUS

## 2015-06-19 MED ORDER — HYDROCODONE-ACETAMINOPHEN 5-325 MG PO TABS
1.0000 | ORAL_TABLET | Freq: Four times a day (QID) | ORAL | Status: DC | PRN
  Administered 2015-06-20 (×3): 2 via ORAL
  Administered 2015-06-21 (×2): 1 via ORAL
  Filled 2015-06-19 (×2): qty 2
  Filled 2015-06-19: qty 1
  Filled 2015-06-19 (×2): qty 2
  Filled 2015-06-19: qty 1

## 2015-06-19 MED ORDER — FERROUS SULFATE 325 (65 FE) MG PO TABS
325.0000 mg | ORAL_TABLET | Freq: Every day | ORAL | Status: DC
  Administered 2015-06-20 – 2015-06-21 (×2): 325 mg via ORAL
  Filled 2015-06-19 (×3): qty 1

## 2015-06-19 MED ORDER — HEPARIN SODIUM (PORCINE) 5000 UNIT/ML IJ SOLN: 5000.0000 [IU] | Freq: Once | INTRAMUSCULAR | Status: DC

## 2015-06-19 MED ORDER — POLYVINYL ALCOHOL 1.4 % OP SOLN
1.0000 [drp] | OPHTHALMIC | Status: DC | PRN
  Filled 2015-06-19: qty 15

## 2015-06-19 MED ORDER — LISINOPRIL 10 MG PO TABS
10.0000 mg | ORAL_TABLET | Freq: Every day | ORAL | Status: DC
  Administered 2015-06-20 – 2015-06-21 (×2): 10 mg via ORAL
  Filled 2015-06-19 (×2): qty 1

## 2015-06-19 MED ORDER — MORPHINE SULFATE 4 MG/ML IJ SOLN: 6.0000 mg | Freq: Once | INTRAMUSCULAR | Status: DC

## 2015-06-19 MED ORDER — OXYCODONE HCL 5 MG PO TABS
5.0000 mg | ORAL_TABLET | Freq: Once | ORAL | Status: AC
  Administered 2015-06-19: 5 mg via ORAL
  Filled 2015-06-19: qty 1

## 2015-06-19 MED ORDER — PREDNISONE 1 MG PO TABS
3.0000 mg | ORAL_TABLET | Freq: Every day | ORAL | Status: DC
  Administered 2015-06-20 – 2015-06-21 (×2): 3 mg via ORAL
  Filled 2015-06-19 (×3): qty 3

## 2015-06-19 MED ORDER — MORPHINE SULFATE 4 MG/ML IJ SOLN
4.0000 mg | Freq: Once | INTRAMUSCULAR | Status: AC
  Administered 2015-06-19: 4 mg via INTRAVENOUS
  Filled 2015-06-19: qty 1

## 2015-06-19 MED ORDER — HYDRALAZINE HCL 25 MG PO TABS
25.0000 mg | ORAL_TABLET | Freq: Two times a day (BID) | ORAL | Status: DC
  Administered 2015-06-20 – 2015-06-21 (×3): 25 mg via ORAL
  Filled 2015-06-19 (×5): qty 1

## 2015-06-19 MED ORDER — ALBUTEROL SULFATE HFA 108 (90 BASE) MCG/ACT IN AERS: 2.0000 | INHALATION_SPRAY | Freq: Four times a day (QID) | RESPIRATORY_TRACT | Status: DC | PRN

## 2015-06-19 MED ORDER — METHOTREXATE (ANTI-RHEUMATIC) 2.5 MG PO TABS: 15.0000 mg | ORAL_TABLET | ORAL | Status: DC

## 2015-06-19 MED ORDER — MORPHINE SULFATE 2 MG/ML IJ SOLN
0.5000 mg | INTRAMUSCULAR | Status: DC | PRN
  Administered 2015-06-20: 0.5 mg via INTRAVENOUS
  Filled 2015-06-19: qty 1

## 2015-06-19 MED ORDER — LABETALOL HCL 200 MG PO TABS
200.0000 mg | ORAL_TABLET | Freq: Two times a day (BID) | ORAL | Status: DC
  Administered 2015-06-20 – 2015-06-21 (×3): 200 mg via ORAL
  Filled 2015-06-19 (×5): qty 1

## 2015-06-19 MED ORDER — FENTANYL CITRATE (PF) 100 MCG/2ML IJ SOLN
100.0000 ug | Freq: Once | INTRAMUSCULAR | Status: AC
  Administered 2015-06-19: 100 ug via INTRAVENOUS
  Filled 2015-06-19: qty 2

## 2015-06-19 NOTE — Progress Notes (Addendum)
EDCM asked to speak to patient and her daughter regarding discharge plan.  EDCM spoke to patient and her daughter Lattie Corns at bedside.  EDCM updated patient and her daughter that home health services have been arranged for RN, PT, OT, aide and social worker with [name]  EDCM asked patient and her daughter if patient requires any further equipment at home?  [name] denies further need for equipment.  [name] apologized if she has given staff a difficult time.  EDCM allowed patient's daughter to express feelings of patient's declining status.  [name] reports patient has an aide from [facility] who comes to see patient every day for 2-3 hours.  [name] reports patient is not qualifying for more aide hours at home. EDCM asked patient's daughter if there is family that can help with care of patient?  [name] stated, "We are doing the best we can."  [facility] offered support to patient and her daughter.  [name] would like patient to go to rehab.  EDCM explained Medicare guidelines to patient's daughter regarding SNF placement.  [facility] consulted EDSW to speak to patient regarding placement options.  Patient and patient's daughter very thankful for services.  No further EDCM needs at this time.  EDCM updated EDRN.  06/19/2015 A.Bennie Dallas RNCM [address] [facility] faxed home health referral to [facility] with confirmation of receipt.

## 2015-06-19 NOTE — ED Notes (Addendum)
Pt was given meds, repositioned in bed, placed blankets on patient, gave ice chips and vitals taken. Alert, oriented. VSS.

## 2015-06-19 NOTE — ED Notes (Signed)
Pt and family informed that we are simply waiting on MRI to come get patient but they have other patients ahead of her. They understood and were told to call if they needed anything else. Pt is alert, oriented and VSS.

## 2015-06-19 NOTE — ED Provider Notes (Signed)
CSN: [id number]     Arrival date & time 06/19/15  1240 History   First MD Initiated Contact with Patient 06/19/15 1255     Chief Complaint  Patient presents with  . Hip Pain     (Consider location/radiation/quality/duration/timing/severity/associated sxs/prior Treatment) HPI  Patient is not 81 year old female multiple comorbidities presenting today with exacerbation of her left hip pain. Patient has left hip pain chronically and occasionally gets worse and she comes here to the emergency department. She has oxycodone at home. All range of motion. This is no different than her usual pain. No fevers no other symptoms.  Past Medical History  Diagnosis Date  . Hypertension   . First degree atrioventricular block   . Onychia and paronychia of toe   . Muscle weakness (generalized)   . Allergic rhinitis due to pollen 04/27/2007  . Gout, unspecified 04/19/2013  . Disorder of bone and cartilage, unspecified   . Edema 05/03/2013  . Hyperlipidemia 04/27/2007  . Unspecified essential hypertension 08/08/2013  . Coronary atherosclerosis of native coronary artery   . Other malaise and fatigue   . Cough   . Other fall   . Unspecified vitamin D deficiency   . TIA (transient ischemic attack)     "a few one summer" (08/07/2013)  . Myocardial infarction ~ 1970  . Exertional shortness of breath     "sometimes" (08/07/2013)  . Type II diabetes mellitus     "fasting 90-110s" (08/07/2013)  . Osteoarthrosis, unspecified whether generalized or localized, unspecified site 04/27/2007  . Prepatellar bursitis   . Arthritis     "in q joint" (08/07/2013)  . Stroke 01/17/2014  . Seizures   . Muscle spasm    Past Surgical History  Procedure Laterality Date  . Tonsillectomy  08/1974  . Replacement total knee Right 09/2005  . Shoulder open rotator cuff repair Right 07/1999  . Appendectomy  1960  . Abdominal hysterectomy  04/1980  . Transthoracic echocardiogram  2003    EF 55-65%; mild concentric LVH  .  Joint replacement    . Total knee arthroplasty Left 08/07/2013  . Cataract extraction w/ intraocular lens  implant, bilateral Bilateral ~ 2012  . Total knee arthroplasty Left 08/07/2013    Procedure: TOTAL KNEE ARTHROPLASTY- LEFT;  Surgeon: Dannielle Huh, MD;  Location: [facility] OR;  Service: Orthopedics;  Laterality: Left;  . Cardiac catheterization  2003   Family History  Problem Relation Age of Onset  . Heart attack Father    Social History  Substance Use Topics  . Smoking status: Never Smoker   . Smokeless tobacco: Never Used  . Alcohol Use: No   OB History    No data available     Review of Systems  Constitutional: Negative for activity change and fatigue.  HENT: Negative for congestion and drooling.   Eyes: Negative for discharge.  Respiratory: Negative for cough and chest tightness.   Cardiovascular: Negative for chest pain.  Gastrointestinal: Negative for abdominal distention.  Genitourinary: Negative for dysuria and difficulty urinating.  Musculoskeletal: Positive for arthralgias. Negative for gait problem.  Skin: Negative for rash.  Allergic/Immunologic: Negative for immunocompromised state.  Neurological: Negative for seizures and speech difficulty.  Psychiatric/Behavioral: Negative for behavioral problems and agitation.      Allergies  Cetirizine hcl; Codeine; Fish allergy; Indomethacin; Lyrica; Methyldopa; Shellfish allergy; and Tomato  Home Medications   Prior to Admission medications   Medication Sig Start Date End Date Taking? Authorizing Provider  albuterol (PROVENTIL HFA;VENTOLIN HFA) 108 (90 BASE)  MCG/ACT inhaler Inhale 2 puffs into the lungs every 6 (six) hours as needed for wheezing. 11/27/14  Yes Mahima Pandey, MD  allopurinol (ZYLOPRIM) 100 MG tablet TAKE 1 TABLET BY MOUTH EVERY DAY FOR GOUT 06/10/15  Yes Kirt Boys, DO  aspirin EC 81 MG tablet Take 81 mg by mouth daily.   Yes Historical Provider, MD  calcium-vitamin D (OSCAL WITH D) 500-200 MG-UNIT per  tablet Take 1 tablet by mouth 2 (two) times daily.   Yes Historical Provider, MD  Cholecalciferol (VITAMIN D-3) 1000 UNITS CAPS Take 1 capsule by mouth daily.   Yes Historical Provider, MD  cloNIDine (CATAPRES) 0.1 MG tablet Take 0.1 mg by mouth daily as needed (blood presure is above 160). For SBP>160 02/06/14  Yes Mahima Pandey, MD  ferrous sulfate 325 (65 FE) MG tablet Take one tablet by mouth three times daily for anemia Patient taking differently: Take 325 mg by mouth daily with breakfast.  07/12/14  Yes Mahima Pandey, MD  folic acid (FOLVITE) 1 MG tablet Take 2 mg by mouth daily.    Yes Historical Provider, MD  glucose blood (ONE TOUCH ULTRA TEST) test strip Check blood sugar once daily DX 250.00 05/18/14  Yes Tiffany L Reed, DO  hydrALAZINE (APRESOLINE) 25 MG tablet Take 25 mg by mouth 2 (two) times daily.   Yes Historical Provider, MD  labetalol (NORMODYNE) 100 MG tablet Take 2 tablets (200 mg total) by mouth 2 (two) times daily. 06/05/15  Yes Kirt Boys, DO  lisinopril (PRINIVIL,ZESTRIL) 10 MG tablet Take one tablet by mouth once daily for blood pressure and protect kidneys. 04/22/15  Yes Kirt Boys, DO  metFORMIN (GLUCOPHAGE) 500 MG tablet TAKE 1 TABLET BY MOUTH EVERY DAY FOR DIABETES 06/05/15  Yes Kirt Boys, DO  methotrexate (RHEUMATREX) 2.5 MG tablet Provided by rheumatology- started on 4 tablets every week for 2 weeks, then 6 tab every week for 2 weeks, then 8 tab every week 06/27/14  Yes Mahima Pandey, MD  OVER THE COUNTER MEDICATION Take 1 tablet by mouth 2 (two) times daily. Wellness formula for immune support   Yes Historical Provider, MD  OVER THE COUNTER MEDICATION Take 5 mLs by mouth every morning. Chloraphil liquid, mix in apply juice   Yes Historical Provider, MD  oxyCODONE (OXY IR/ROXICODONE) 5 MG immediate release tablet Take 1 tablet (5 mg total) by mouth every 4 (four) hours as needed for severe pain. 06/13/15  Yes Sharon Seller, NP  polyvinyl alcohol (LIQUIFILM TEARS)  1.4 % ophthalmic solution Place 1 drop into both eyes as needed (for dry eyes).   Yes Historical Provider, MD  predniSONE (DELTASONE) 1 MG tablet Take 2-4 mg by mouth daily with breakfast. As directed: Directions where to take- 4mg  every morning in July, then 3mg  every morning in august and 2mg  every morning in September.   Yes Historical Provider, MD  traMADol (ULTRAM) 50 MG tablet take 1-2 tablets by mouth every 6 hours if needed for MODERATE pain 05/17/15  Yes , DO  triamcinolone cream (KENALOG) 0.1 % APPLY TO AFFECTED AREA TWICE DAILY AS NEEDED FOR IRRITATION 04/10/15  Yes 07/18/15, DO  vitamin C (ASCORBIC ACID) 500 MG tablet Take 500 mg by mouth daily.   Yes Historical Provider, MD  Adalimumab 40 MG/0.8ML PSKT Inject into the skin. Take 0.8 ml every two weeks    Historical Provider, MD  amoxicillin-clavulanate (AUGMENTIN) 875-125 MG per tablet Take 1 tablet by mouth 2 (two) times daily. Patient not taking: Reported  on 06/19/2015 03/22/15   Kirt Boys, DO  HYDROcodone-acetaminophen (NORCO/VICODIN) 5-325 MG per tablet Take 1 tablet by mouth every 4 (four) hours as needed for severe pain. Patient not taking: Reported on 06/19/2015 03/27/15   Gerhard Munch, MD  predniSONE (DELTASONE) 5 MG tablet Take 1 tablet (5 mg total) by mouth daily before breakfast. Patient not taking: Reported on 06/19/2015 03/22/15   Kirt Boys, DO  senna (SENOKOT) 8.6 MG TABS tablet Take one tablet by mouth once daily for constipation Patient not taking: Reported on 06/19/2015 07/12/14   Oneal Grout, MD  VOLTAREN 1 % GEL APPLY TO AFFECTED AREA 3-4 TIMES DAILY Patient not taking: Reported on 06/19/2015 01/08/15   Oneal Grout, MD   BP 173/83 mmHg  Pulse 99  Temp(Src) 98.2 F (36.8 C) (Oral)  Resp 20  SpO2 97% Physical Exam  Constitutional: She is oriented to person, place, and time. She appears well-developed and well-nourished.  Obese  HENT:  Head: Normocephalic and atraumatic.  Eyes: Conjunctivae  are normal. Right eye exhibits no discharge.  Neck: Neck supple.  Cardiovascular: Normal rate, regular rhythm and normal heart sounds.   No murmur heard. Pulmonary/Chest: Effort normal and breath sounds normal. She has no wheezes. She has no rales.  Abdominal: Soft. She exhibits no distension. There is no tenderness.  Musculoskeletal: Normal range of motion. She exhibits no edema.  Neurological: She is oriented to person, place, and time. No cranial nerve deficit.  Skin: Skin is warm and dry. No rash noted. She is not diaphoretic.  Psychiatric: She has a normal mood and affect. Her behavior is normal.  Nursing note and vitals reviewed.   ED Course  Procedures (including critical care time) Labs Review Labs Reviewed  CBC WITH DIFFERENTIAL/PLATELET - Abnormal; Notable for the following:    WBC 10.9 (*)    RBC 3.40 (*)    Hemoglobin 10.8 (*)    HCT 32.0 (*)    RDW 17.3 (*)    Eosinophils Relative 7 (*)    Eosinophils Absolute 0.8 (*)    All other components within normal limits  BASIC METABOLIC PANEL    Imaging Review Dg Hip Unilat With Pelvis 2-3 Views Left  06/19/2015   CLINICAL DATA:  Left hip pain.  History of rheumatoid arthritis.  EXAM: DG HIP (WITH OR WITHOUT PELVIS) 2-3V LEFT  COMPARISON:  01/24/2015 and 06/12/2014  FINDINGS: The pelvic bony ring is intact. There is mild joint space narrowing along the medial and superior aspect the left hip joint. No evidence for an acute fracture or dislocation. Again noted is a lucent lesion in the proximal left femur in the subtrochanteric region with some peripheral sclerosis. This lesion roughly measures 4.9 cm in length and similar to the prior examinations.  IMPRESSION: No acute bone abnormality involving the pelvis or left hip.  Mild joint space narrowing in the left hip joint.  There is a stable lucent lesion involving the proximal left femur. Suspect this represents a benign etiology such as an enchondroma.  If the patient has  persistent pain, consider further characterization with MRI to look for an occult injury and further characterization of the lucent bone lesion.   Electronically Signed   By: Richarda Overlie [name]   On: 06/19/2015 14:34     EKG Interpretation None      MDM   Final diagnoses:  None    Patient is an 81 year old female with left hip pain. She has acute on chronic pain. She had this pain  for several years. [name] family member works at [employer]. She is asking for an MRI. She states that her rheumatologist is asking for MRI here in the emergency room.  We will do an MRI of the left hip. Although it could be potentially done as an outpatient given there was no trauma to left hip.  Case management said they saw patient and arrange for services. I put in an order for face-to-face. However patient's family member now saying that she was never seen by case management.    Naiyah Klostermann Randall An, MD 06/19/15 [phone]

## 2015-06-19 NOTE — ED Notes (Signed)
Family updated; pt should be returning shortly per MRI.

## 2015-06-19 NOTE — ED Notes (Signed)
Bed: WA08 Expected date:  Expected time:  Means of arrival:  Comments: EMS- 81yo F, hip pain, no injury or deformity

## 2015-06-19 NOTE — Care Management Note (Addendum)
Case Management Note  Patient Details  Name: [name] [name] MRN: [id number] Date of Birth: Mar 10, 1930  Subjective/Objective:       81 yr old female c/o hip pain Daughter at bedside States pt is [facility] with [facility] for [facility] unable to find pt as an active [facility] client              Action/Plan: Spoke with [facility] (not active Spoke with [facility] at [phone] and pt was active in 2014  1525 spoke with Bing Neighbors about home health referral for patient updated patient and daughter who confirm [name] the [facility] health agency patient and daughter updated preference for [name] and [facility] for [facility] E. EDP also updated Expected Discharge Date:     June 19 2015              Expected Discharge Plan:   home with home health   In-House Referral:   na  Discharge planning Services   home health   Post Acute Care Choice:   home health Choice offered to:   daughter and patient  DME Arranged:   na  DME Agency:   na  HH Arranged:   HHRN, PT, OT, AIde, SW HH Agency:   [facility]  Status of Service:   completed    Additional Comments:  Ophelia Shoulder, RN 06/19/2015, 2:52 PM

## 2015-06-19 NOTE — ED Provider Notes (Signed)
4:51 PM Assumed care from Dr. Vinnie Level, please see their note for full history, physical and decision making until this point. In brief this is a 81 y.o. year old female who presented to the ED tonight with Hip Pain     An 81 year old female with left hip pain. Unable to walk secondary to pain, MRI pending.  MRI without fracture but does have evidence of a total hamstring avulsion. Discussed with orthopedic on call and recommended outpatient treatment. Discussed with family and they want her surgeon consultation. The daughter spoke with the on-call person with her surgery group, Dr. Thurston Hole, who agreed patient should be admitted to medicine for pain control and evaluation in the morning. Schedule hospitalist and patient admitted.   Labs, studies and imaging reviewed by myself and considered in medical decision making if ordered. Imaging interpreted by radiology. .  Labs Reviewed  CBC WITH DIFFERENTIAL/PLATELET - Abnormal; Notable for the following:    WBC 10.9 (*)    RBC 3.40 (*)    Hemoglobin 10.8 (*)    HCT 32.0 (*)    RDW 17.3 (*)    Eosinophils Relative 7 (*)    Eosinophils Absolute 0.8 (*)    All other components within normal limits  BASIC METABOLIC PANEL - Abnormal; Notable for the following:    Sodium 134 (*)    Glucose, Bld 104 (*)    All other components within normal limits  URINE CULTURE  URINALYSIS, ROUTINE W REFLEX MICROSCOPIC (NOT AT [facility])     Marily Memos, MD 06/20/15 [phone]

## 2015-06-19 NOTE — ED Notes (Signed)
Pt returned from MRI °

## 2015-06-19 NOTE — ED Notes (Signed)
Per EMS report: pt from home with a c/o left hip pain that began at 9am.  Pt took oxycodon at 09:30am and had no relief.  Pt hx of arthritis that usually affects her right side but has had affected her left side in the past. Pt reports that pain radiates down her left leg.  Pt denies any fall or trauma to the left hip.  EMS noted some slight rotation to her left foot but pt reports that's her baseline.  EMS: BP: 150/90, HR: 100, RR: 16R, 97%RA. A/o x 4 and is normally ambulatory.

## 2015-06-19 NOTE — Progress Notes (Signed)
CSW met with patient at bedside. Daughter was present. Patient confirms that she presents to [facility] due to hip pain.  Patient states that she lives at home alone in [place]. Daughter informed CSW that the patient currently has home health from [facility]. Patient states states that her aide visits her daily for 2-3 hours. Also, patient informed CSW that she receives assistance with completing her ADL's.   Patient informed CSW that she does not fall often. Patient states that she uses a walker to ambulate at all times.   Daughter informed CSW that she and patient's other daughter are primary support. [name] "Manfred Shirts", who lives in [place] is [place]. Also, she states that she would be interested in facility for patient. CSW informed daughter and patient about Medicare guidelines.   CSW provided the patient and daughter with support.  Daughter and patient state that they do not have an questions for CSW at this time.  Jeanette/Daughter [phone] Patrisha/Daughter / POA [phone]  Willette Brace [phone] ED CSW 06/19/2015 9:35 PM

## 2015-06-19 NOTE — H&P (Signed)
[facility] History and Physical  [name] [name] YSA:[id number] DOB: [birth year] DOA: 06/19/2015  Referring physician: EDP PCP: Kirt Boys, DO   Chief Complaint: L hip pain   HPI: [name] [name] is a 81 y.o. female who presents to the ED with c/o L hip pain that began at 9 am.  Oxycodone provided no relief.  Has h/o arthritis.  Pain is severe and patient not able to ambulate with it.  No fall nor trauma.  Pain radiates down her left leg.  Work up in ED reveals avulsion of hamstring tendon.  Review of Systems: Systems reviewed.  As above, otherwise negative  Past Medical History  Diagnosis Date  . Hypertension   . First degree atrioventricular block   . Onychia and paronychia of toe   . Muscle weakness (generalized)   . Allergic rhinitis due to pollen 04/27/2007  . Gout, unspecified 04/19/2013  . Disorder of bone and cartilage, unspecified   . Edema 05/03/2013  . Hyperlipidemia 04/27/2007  . Unspecified essential hypertension 08/08/2013  . Coronary atherosclerosis of native coronary artery   . Other malaise and fatigue   . Cough   . Other fall   . Unspecified vitamin D deficiency   . TIA (transient ischemic attack)     "a few one summer" (08/07/2013)  . Myocardial infarction ~ 1970  . Exertional shortness of breath     "sometimes" (08/07/2013)  . Type II diabetes mellitus     "fasting 90-110s" (08/07/2013)  . Osteoarthrosis, unspecified whether generalized or localized, unspecified site 04/27/2007  . Prepatellar bursitis   . Arthritis     "in q joint" (08/07/2013)  . Stroke 01/17/2014  . Seizures   . Muscle spasm    Past Surgical History  Procedure Laterality Date  . Tonsillectomy  08/1974  . Replacement total knee Right 09/2005  . Shoulder open rotator cuff repair Right 07/1999  . Appendectomy  1960  . Abdominal hysterectomy  04/1980  . Transthoracic echocardiogram  2003    EF 55-65%; mild concentric LVH  . Joint replacement    . Total knee  arthroplasty Left 08/07/2013  . Cataract extraction w/ intraocular lens  implant, bilateral Bilateral ~ 2012  . Total knee arthroplasty Left 08/07/2013    Procedure: TOTAL KNEE ARTHROPLASTY- LEFT;  Surgeon: Dannielle Huh, MD;  Location: [facility] OR;  Service: Orthopedics;  Laterality: Left;  . Cardiac catheterization  2003   Social History:  reports that she has never smoked. She has never used smokeless tobacco. She reports that she does not drink alcohol or use illicit drugs.  Allergies  Allergen Reactions  . Cetirizine Hcl Other (See Comments)    unknown  . Codeine Other (See Comments)    unknown  . Fish Allergy   . Indomethacin Other (See Comments)    unknown  . Lyrica [Pregabalin]     hallucinations  . Methyldopa Other (See Comments)    unknown  . Shellfish Allergy Hives  . Tomato Rash    Family History  Problem Relation Age of Onset  . Heart attack Father      Prior to Admission medications   Medication Sig Start Date End Date Taking? Authorizing Provider  albuterol (PROVENTIL HFA;VENTOLIN HFA) 108 (90 BASE) MCG/ACT inhaler Inhale 2 puffs into the lungs every 6 (six) hours as needed for wheezing. 11/27/14  Yes Mahima Glade Lloyd, MD  allopurinol (ZYLOPRIM) 100 MG tablet TAKE 1 TABLET BY MOUTH EVERY DAY FOR GOUT 06/10/15  Yes Kirt Boys, DO  aspirin  EC 81 MG tablet Take 81 mg by mouth daily.   Yes Historical Provider, MD  calcium-vitamin D (OSCAL WITH D) 500-200 MG-UNIT per tablet Take 1 tablet by mouth 2 (two) times daily.   Yes Historical Provider, MD  Cholecalciferol (VITAMIN D-3) 1000 UNITS CAPS Take 1 capsule by mouth daily.   Yes Historical Provider, MD  cloNIDine (CATAPRES) 0.1 MG tablet Take 0.1 mg by mouth daily as needed (blood presure is above 160). For SBP>160 02/06/14  Yes Mahima Pandey, MD  ferrous sulfate 325 (65 FE) MG tablet Take one tablet by mouth three times daily for anemia Patient taking differently: Take 325 mg by mouth daily with breakfast.  07/12/14  Yes Mahima  Pandey, MD  folic acid (FOLVITE) 1 MG tablet Take 2 mg by mouth daily.    Yes Historical Provider, MD  glucose blood (ONE TOUCH ULTRA TEST) test strip Check blood sugar once daily DX 250.00 05/18/14  Yes Tiffany L Reed, DO  hydrALAZINE (APRESOLINE) 25 MG tablet Take 25 mg by mouth 2 (two) times daily.   Yes Historical Provider, MD  labetalol (NORMODYNE) 100 MG tablet Take 2 tablets (200 mg total) by mouth 2 (two) times daily. 06/05/15  Yes Kirt Boys, DO  lisinopril (PRINIVIL,ZESTRIL) 10 MG tablet Take one tablet by mouth once daily for blood pressure and protect kidneys. 04/22/15  Yes Kirt Boys, DO  metFORMIN (GLUCOPHAGE) 500 MG tablet TAKE 1 TABLET BY MOUTH EVERY DAY FOR DIABETES 06/05/15  Yes Kirt Boys, DO  methotrexate (RHEUMATREX) 2.5 MG tablet Provided by rheumatology- started on 4 tablets every week for 2 weeks, then 6 tab every week for 2 weeks, then 8 tab every week 06/27/14  Yes Mahima Pandey, MD  OVER THE COUNTER MEDICATION Take 1 tablet by mouth 2 (two) times daily. Wellness formula for immune support   Yes Historical Provider, MD  OVER THE COUNTER MEDICATION Take 5 mLs by mouth every morning. Chloraphil liquid, mix in apply juice   Yes Historical Provider, MD  oxyCODONE (OXY IR/ROXICODONE) 5 MG immediate release tablet Take 1 tablet (5 mg total) by mouth every 4 (four) hours as needed for severe pain. 06/13/15  Yes Sharon Seller, NP  polyvinyl alcohol (LIQUIFILM TEARS) 1.4 % ophthalmic solution Place 1 drop into both eyes as needed (for dry eyes).   Yes Historical Provider, MD  predniSONE (DELTASONE) 1 MG tablet Take 2-4 mg by mouth daily with breakfast. As directed: Directions where to take- 4mg  every morning in July, then 3mg  every morning in august and 2mg  every morning in September.   Yes Historical Provider, MD  traMADol (ULTRAM) 50 MG tablet take 1-2 tablets by mouth every 6 hours if needed for MODERATE pain 05/17/15  Yes , DO  triamcinolone cream (KENALOG) 0.1 %  APPLY TO AFFECTED AREA TWICE DAILY AS NEEDED FOR IRRITATION 04/10/15  Yes 07/18/15, DO  vitamin C (ASCORBIC ACID) 500 MG tablet Take 500 mg by mouth daily.   Yes Historical Provider, MD  Adalimumab 40 MG/0.8ML PSKT Inject into the skin. Take 0.8 ml every two weeks    Historical Provider, MD   Physical Exam: Filed Vitals:   06/19/15 2144  BP: 145/72  Pulse: 111  Temp:   Resp:     BP 145/72 mmHg  Pulse 111  Temp(Src) 98.1 F (36.7 C) (Oral)  Resp 20  SpO2 94%  General Appearance:    Alert, oriented, no distress, appears stated age  Head:    Normocephalic, atraumatic  Eyes:    PERRL, EOMI, sclera non-icteric        Nose:   Nares without drainage or epistaxis. Mucosa, turbinates normal  Throat:   Moist mucous membranes. Oropharynx without erythema or exudate.  Neck:   Supple. No carotid bruits.  No thyromegaly.  No lymphadenopathy.   Back:     No CVA tenderness, no spinal tenderness  Lungs:     Clear to auscultation bilaterally, without wheezes, rhonchi or rales  Chest wall:    No tenderness to palpitation  Heart:    Regular rate and rhythm without murmurs, gallops, rubs  Abdomen:     Soft, non-tender, nondistended, normal bowel sounds, no organomegaly  Genitalia:    deferred  Rectal:    deferred  Extremities:   No clubbing, cyanosis or edema.  Pulses:   2+ and symmetric all extremities  Skin:   Skin color, texture, turgor normal, no rashes or lesions  Lymph nodes:   Cervical, supraclavicular, and axillary nodes normal  Neurologic:   CNII-XII intact. Normal strength, sensation and reflexes      throughout    Labs on Admission:  Basic Metabolic Panel:  Recent Labs Lab 06/19/15 1440  NA 134*  K 3.8  CL 103  CO2 23  GLUCOSE 104*  BUN 17  CREATININE 0.81  CALCIUM 9.6   Liver Function Tests: No results for input(s): AST, ALT, ALKPHOS, BILITOT, PROT, ALBUMIN in the last 168 hours. No results for input(s): LIPASE, AMYLASE in the last 168 hours. No results for  input(s): AMMONIA in the last 168 hours. CBC:  Recent Labs Lab 06/19/15 1440  WBC 10.9*  NEUTROABS 7.2  HGB 10.8*  HCT 32.0*  MCV 94.1  PLT 368   Cardiac Enzymes: No results for input(s): CKTOTAL, CKMB, CKMBINDEX, TROPONINI in the last 168 hours.  BNP (last 3 results) No results for input(s): PROBNP in the last 8760 hours. CBG: No results for input(s): GLUCAP in the last 168 hours.  Radiological Exams on Admission: Mr Hip Left W Wo Contrast  06/19/2015   CLINICAL DATA:  Severe left hip pain. Lesion in the proximal left femur.  EXAM: MRI OF THE LEFT HIP WITHOUT AND WITH CONTRAST  TECHNIQUE: Multiplanar, multisequence MR imaging was performed both before and after administration of intravenous contrast.  CONTRAST:  14 cc MultiHance  COMPARISON:  Radiographs dated 06/19/2015 and 01/24/2015 and CT scan dated 06/12/2014  FINDINGS: Muscles and tendons  Muscles and tendons: There is avulsion of the hamstring tendon origins from the left ischial tuberosity with marked edema as well as some fluid surrounding the ischial tuberosity and extending between the left lesser trochanter and the ischium. There is slight enhancement of the soft tissues in that area after contrast administration.  There is a 3.4 x 1.8 x 1.8 cm benign appearing probable chondroid lesion in the proximal left femoral shaft just below the lesser trochanter consistent with a benign enchondroma with no pathologic enhancement after contrast administration.  There is moderate arthritis of the left hip joint with diffuse joint space narrowing and multiple subcortical cysts in the acetabulum.  There are no fractures.  There is no bone destruction.  The patient has severe degenerative disc disease in the lumbar spine with severe spinal stenosis at L5-S1. Multiple diverticula in the distal colon.  IMPRESSION: 1. Avulsion of all the hamstring tendons from the left ischial tuberosity with secondary edema and fluid in the adjacent soft tissues  including the adjacent musculature. 2. Benign chondroid lesion in the  proximal left femur, unchanged since 06/12/2014. 3. Moderate osteoarthritis the left hip. 4. Severe spinal stenosis at L5-S1.   Electronically Signed   By: Francene Boyers [name]   On: 06/19/2015 21:42   Dg Hip Unilat With Pelvis 2-3 Views Left  06/19/2015   CLINICAL DATA:  Left hip pain.  History of rheumatoid arthritis.  EXAM: DG HIP (WITH OR WITHOUT PELVIS) 2-3V LEFT  COMPARISON:  01/24/2015 and 06/12/2014  FINDINGS: The pelvic bony ring is intact. There is mild joint space narrowing along the medial and superior aspect the left hip joint. No evidence for an acute fracture or dislocation. Again noted is a lucent lesion in the proximal left femur in the subtrochanteric region with some peripheral sclerosis. This lesion roughly measures 4.9 cm in length and similar to the prior examinations.  IMPRESSION: No acute bone abnormality involving the pelvis or left hip.  Mild joint space narrowing in the left hip joint.  There is a stable lucent lesion involving the proximal left femur. Suspect this represents a benign etiology such as an enchondroma.  If the patient has persistent pain, consider further characterization with MRI to look for an occult injury and further characterization of the lucent bone lesion.   Electronically Signed   By: Richarda Overlie [name]   On: 06/19/2015 14:34    EKG: Independently reviewed.  Assessment/Plan Principal Problem:   Avulsion of hamstring muscle Active Problems:   HTN (hypertension)   Rheumatoid arthritis   Type 2 diabetes mellitus with other diabetic kidney complication   1. Avulsion of hamstring muscle -  1. EDP spoke with Dr. Wyline Mood who is on call for the patients orthopaedic surgeon Dr. Jonell Cluck: 1. Per him admit patient, pain control, and Dr. Jonell Cluck will see in AM. 2. Is a surgical injury 2. Keeping patient NPO after midnight 3. Putting patient on hip fx pathway 4. SCDs only ordered for DVT ppx at  this point due to acute injury and surrounding soft tissue edema / fluid collection.  Likely will require additional meds either pre or post op tomorrow. 2. DM2 - 1. Hold home metformin 2. Sensitive scale SSI q4h 3. RA - continuing all home meds 4. HTN - continuing home meds    Code Status: Full Code  Family Communication: Daughter at bedside Disposition Plan: admit to inpatient  Time spent: 70 min  Kathleene Bergemann M. [facility] Pager [phone]  If 7AM-7PM, please contact the day team taking care of the patient [email or web address] Password [email or web address] 06/19/2015, 11:42 PM

## 2015-06-19 NOTE — ED Notes (Signed)
Patient transported to MRI 

## 2015-06-19 NOTE — ED Notes (Signed)
Pt ambulated in the room with assistance. C/o left hip pain.

## 2015-06-20 ENCOUNTER — Encounter (HOSPITAL_COMMUNITY): Payer: Self-pay | Admitting: *Deleted

## 2015-06-20 DIAGNOSIS — S76392A Other specified injury of muscle, fascia and tendon of the posterior muscle group at thigh level, left thigh, initial encounter: Secondary | ICD-10-CM | POA: Diagnosis present

## 2015-06-20 DIAGNOSIS — I10 Essential (primary) hypertension: Secondary | ICD-10-CM | POA: Diagnosis not present

## 2015-06-20 DIAGNOSIS — S76892A Other injury of other specified muscles, fascia and tendons at thigh level, left thigh, initial encounter: Secondary | ICD-10-CM | POA: Diagnosis not present

## 2015-06-20 DIAGNOSIS — M069 Rheumatoid arthritis, unspecified: Secondary | ICD-10-CM | POA: Diagnosis not present

## 2015-06-20 LAB — GLUCOSE, CAPILLARY
Glucose-Capillary: 102 mg/dL — ABNORMAL HIGH (ref 65–99)
Glucose-Capillary: 112 mg/dL — ABNORMAL HIGH (ref 65–99)
Glucose-Capillary: 114 mg/dL — ABNORMAL HIGH (ref 65–99)
Glucose-Capillary: 133 mg/dL — ABNORMAL HIGH (ref 65–99)
Glucose-Capillary: 139 mg/dL — ABNORMAL HIGH (ref 65–99)
Glucose-Capillary: 191 mg/dL — ABNORMAL HIGH (ref 65–99)

## 2015-06-20 LAB — CBC
HCT: 31 % — ABNORMAL LOW (ref 36.0–46.0)
Hemoglobin: 10.2 g/dL — ABNORMAL LOW (ref 12.0–15.0)
MCH: 30.6 pg (ref 26.0–34.0)
MCHC: 32.9 g/dL (ref 30.0–36.0)
MCV: 93.1 fL (ref 78.0–100.0)
Platelets: 321 10*3/uL (ref 150–400)
RBC: 3.33 MIL/uL — ABNORMAL LOW (ref 3.87–5.11)
RDW: 17.3 % — ABNORMAL HIGH (ref 11.5–15.5)
WBC: 11.9 10*3/uL — ABNORMAL HIGH (ref 4.0–10.5)

## 2015-06-20 LAB — BASIC METABOLIC PANEL
Anion gap: 8 (ref 5–15)
BUN: 14 mg/dL (ref 6–20)
CO2: 25 mmol/L (ref 22–32)
Calcium: 9.2 mg/dL (ref 8.9–10.3)
Chloride: 103 mmol/L (ref 101–111)
Creatinine, Ser: 0.92 mg/dL (ref 0.44–1.00)
GFR calc Af Amer: >60 mL/min (ref >60)
GFR calc non Af Amer: 56 mL/min — ABNORMAL LOW (ref >60)
Glucose, Bld: 140 mg/dL — ABNORMAL HIGH (ref 65–99)
Potassium: 3.7 mmol/L (ref 3.5–5.1)
Sodium: 136 mmol/L (ref 135–145)

## 2015-06-20 MED ORDER — PREDNISONE 1 MG PO TABS: 2.0000 mg | ORAL_TABLET | Freq: Every day | ORAL | Status: DC

## 2015-06-20 MED ORDER — METFORMIN HCL 500 MG PO TABS
500.0000 mg | ORAL_TABLET | Freq: Every day | ORAL | Status: DC
  Administered 2015-06-21: 500 mg via ORAL
  Filled 2015-06-20 (×2): qty 1

## 2015-06-20 MED ORDER — ALBUTEROL SULFATE (2.5 MG/3ML) 0.083% IN NEBU: 2.5000 mg | INHALATION_SOLUTION | Freq: Four times a day (QID) | RESPIRATORY_TRACT | Status: DC | PRN

## 2015-06-20 MED ORDER — METHOTREXATE 2.5 MG PO TABS
15.0000 mg | ORAL_TABLET | ORAL | Status: DC
  Administered 2015-06-20: 15 mg via ORAL
  Filled 2015-06-20: qty 6

## 2015-06-20 MED ORDER — INSULIN ASPART 100 UNIT/ML ~~LOC~~ SOLN: 0.0000 [IU] | Freq: Three times a day (TID) | SUBCUTANEOUS | Status: DC

## 2015-06-20 NOTE — Progress Notes (Signed)
CSW consulted to assist with SNF placement. PN reviewed. Met with pt / daughter, Tomasa Hosteller. Pt / family have declined SNF placement at this time. Pt plans to return home ( or possibly home with daughter ) following hospital d/c. RNCM is assisting with [facility] services. Daughter is aware pt needs 24/7 assistance and feels family members are able to provide this care. CSW is available to assist with SNF placement , if plan changes. Pt is Observation status and is unable to use medicare to assist with cost of SNF placement. Pt does have [employer] which can be used for assistance with SNF placement. This insurance info has been reviewed with pt's daughter.  Werner Lean LCSW [phone]

## 2015-06-20 NOTE — Progress Notes (Signed)
Initial Nutrition Assessment  DOCUMENTATION CODES:   Obesity unspecified  INTERVENTION:   Encourage PO intake RD to continue to monitor for plan  NUTRITION DIAGNOSIS:   Inadequate oral intake related to poor appetite as evidenced by meal completion < 25%.  GOAL:   Patient will meet greater than or equal to 90% of their needs  MONITOR:   PO intake, Labs, Weight trends, Skin, I & O's  REASON FOR ASSESSMENT:   Consult Hip fracture protocol  ASSESSMENT:   81 y.o. female who presents to the ED with c/o L hip pain that began at 9 am. Has h/o arthritis. Pain is severe and patient not able to ambulate with it.  Pt in room with family. Pt reports eating well PTA but did report some appetite changes. Per weight history documentation, pt's weight increased by 35 lb over 1 day, unsure which is accurate weight, possible fluid weight gain.  Pt reports eating 1/3 of her breakfast this morning. Would like nutritional supplement if surgery is pursued. Per ortho note, surgery is not planned.  Nutrition-Focused physical exam completed. Findings are moderate fat depletion, no muscle depletion, and no edema.   Labs reviewed: CBGs: 102-139   Diet Order:  Diet Carb Modified Fluid consistency:: Thin; Room service appropriate?: Yes  Skin:  Reviewed, no issues  Last BM:  8/9  Height:   Ht Readings from Last 1 Encounters:  06/20/15 5\' 4"  (1.626 m)    Weight:   Wt Readings from Last 1 Encounters:  06/20/15 183 lb 3.2 oz (83.1 kg)    Ideal Body Weight:  54.5 kg  BMI:  Body mass index is 31.43 kg/(m^2).  Estimated Nutritional Needs:   Kcal:  1700-1900  Protein:  80-90g  Fluid:  1.8L/day  EDUCATION NEEDS:   No education needs identified at this time  08/20/15, MS, RD, LDN Pager: [phone] After Hours Pager: [phone]

## 2015-06-20 NOTE — Consult Note (Signed)
NAME: [name] [name] MRN:   [id number] DOB:   1930/07/07     HISTORY AND PHYSICAL    HISTORY:   CANDUS BRAUD a 81 y.o. female   who presents to the ED with c/o L hip pain that began at 9 am. Has h/o arthritis. Pain is severe and patient not able to ambulate with it. No fall nor trauma. Pain radiates down her left leg. MRI reveals avulsion of hamstring tendon. Moderate osteoarthritis, significant spinal stenosis L5.  Ortho consulted.  PAST MEDICAL HISTORY:   Past Medical History  Diagnosis Date  . Hypertension   . First degree atrioventricular block   . Onychia and paronychia of toe   . Muscle weakness (generalized)   . Allergic rhinitis due to pollen 04/27/2007  . Gout, unspecified 04/19/2013  . Disorder of bone and cartilage, unspecified   . Edema 05/03/2013  . Hyperlipidemia 04/27/2007  . Unspecified essential hypertension 08/08/2013  . Coronary atherosclerosis of native coronary artery   . Other malaise and fatigue   . Cough   . Other fall   . Unspecified vitamin D deficiency   . TIA (transient ischemic attack)     "a few one summer" (08/07/2013)  . Myocardial infarction ~ 1970  . Exertional shortness of breath     "sometimes" (08/07/2013)  . Type II diabetes mellitus     "fasting 90-110s" (08/07/2013)  . Osteoarthrosis, unspecified whether generalized or localized, unspecified site 04/27/2007  . Prepatellar bursitis   . Arthritis     "in q joint" (08/07/2013)  . Stroke 01/17/2014  . Seizures   . Muscle spasm     PAST SURGICAL HISTORY:   Past Surgical History  Procedure Laterality Date  . Tonsillectomy  08/1974  . Replacement total knee Right 09/2005  . Shoulder open rotator cuff repair Right 07/1999  . Appendectomy  1960  . Abdominal hysterectomy  04/1980  . Transthoracic echocardiogram  2003    EF 55-65%; mild concentric LVH  . Joint replacement    . Total knee arthroplasty Left 08/07/2013  . Cataract extraction w/ intraocular lens  implant, bilateral  Bilateral ~ 2012  . Total knee arthroplasty Left 08/07/2013    Procedure: TOTAL KNEE ARTHROPLASTY- LEFT;  Surgeon: Dannielle Huh, MD;  Location: [facility] OR;  Service: Orthopedics;  Laterality: Left;  . Cardiac catheterization  2003    MEDICATIONS:   Medications Prior to Admission  Medication Sig Dispense Refill  . albuterol (PROVENTIL HFA;VENTOLIN HFA) 108 (90 BASE) MCG/ACT inhaler Inhale 2 puffs into the lungs every 6 (six) hours as needed for wheezing. 1 Inhaler 3  . allopurinol (ZYLOPRIM) 100 MG tablet TAKE 1 TABLET BY MOUTH EVERY DAY FOR GOUT 30 tablet 5  . aspirin EC 81 MG tablet Take 81 mg by mouth daily.    . calcium-vitamin D (OSCAL WITH D) 500-200 MG-UNIT per tablet Take 1 tablet by mouth 2 (two) times daily.    . Cholecalciferol (VITAMIN D-3) 1000 UNITS CAPS Take 1 capsule by mouth daily.    . cloNIDine (CATAPRES) 0.1 MG tablet Take 0.1 mg by mouth daily as needed (blood presure is above 160). For SBP>160    . ferrous sulfate 325 (65 FE) MG tablet Take one tablet by mouth three times daily for anemia (Patient taking differently: Take 325 mg by mouth daily with breakfast. ) 90 tablet 3  . folic acid (FOLVITE) 1 MG tablet Take 2 mg by mouth daily.     Marland Kitchen glucose blood (ONE TOUCH ULTRA  TEST) test strip Check blood sugar once daily DX 250.00 100 each 3  . hydrALAZINE (APRESOLINE) 25 MG tablet Take 25 mg by mouth 2 (two) times daily.    Marland Kitchen labetalol (NORMODYNE) 100 MG tablet Take 2 tablets (200 mg total) by mouth 2 (two) times daily. 120 tablet 5  . lisinopril (PRINIVIL,ZESTRIL) 10 MG tablet Take one tablet by mouth once daily for blood pressure and protect kidneys. 30 tablet 5  . metFORMIN (GLUCOPHAGE) 500 MG tablet TAKE 1 TABLET BY MOUTH EVERY DAY FOR DIABETES 90 tablet 1  . methotrexate (RHEUMATREX) 2.5 MG tablet Provided by rheumatology- started on 4 tablets every week for 2 weeks, then 6 tab every week for 2 weeks, then 8 tab every week 12 tablet 0  . OVER THE COUNTER MEDICATION Take 1 tablet  by mouth 2 (two) times daily. Wellness formula for immune support    . OVER THE COUNTER MEDICATION Take 5 mLs by mouth every morning. Chloraphil liquid, mix in apply juice    . oxyCODONE (OXY IR/ROXICODONE) 5 MG immediate release tablet Take 1 tablet (5 mg total) by mouth every 4 (four) hours as needed for severe pain. 30 tablet 0  . polyvinyl alcohol (LIQUIFILM TEARS) 1.4 % ophthalmic solution Place 1 drop into both eyes as needed (for dry eyes).    . predniSONE (DELTASONE) 1 MG tablet Take 2-4 mg by mouth daily with breakfast. As directed: Directions where to take- 4mg  every morning in July, then 3mg  every morning in august and 2mg  every morning in September.    . traMADol (ULTRAM) 50 MG tablet take 1-2 tablets by mouth every 6 hours if needed for MODERATE pain 120 tablet 0  . triamcinolone cream (KENALOG) 0.1 % APPLY TO AFFECTED AREA TWICE DAILY AS NEEDED FOR IRRITATION 30 g 2  . vitamin C (ASCORBIC ACID) 500 MG tablet Take 500 mg by mouth daily.    . Adalimumab 40 MG/0.8ML PSKT Inject into the skin. Take 0.8 ml every two weeks      ALLERGIES:   Allergies  Allergen Reactions  . Cetirizine Hcl Other (See Comments)    unknown  . Codeine Other (See Comments)    unknown  . Fish Allergy   . Indomethacin Other (See Comments)    unknown  . Lyrica [Pregabalin]     hallucinations  . Methyldopa Other (See Comments)    unknown  . Shellfish Allergy Hives  . Tomato Rash    REVIEW OF SYSTEMS:   Negative except HPI  FAMILY HISTORY:   Family History  Problem Relation Age of Onset  . Heart attack Father     SOCIAL HISTORY:   reports that she has never smoked. She has never used smokeless tobacco. She reports that she does not drink alcohol or use illicit drugs.  PHYSICAL EXAM:  General appearance: alert, cooperative and no distress Resp: clear to auscultation bilaterally Cardio: regular rate and rhythm, S1, S2 normal, no murmur, click, rub or gallop GI: soft, non-tender; bowel sounds  normal; no masses,  no organomegaly Extremities: Homans sign is negative, no sign of DVT Pulses: 2+ and symmetric Skin: Skin color, texture, turgor normal. No rashes or lesions Neurologic: Alert and oriented X 3, normal strength and tone. Normal symmetric reflexes. Normal coordination and gait    LABORATORY STUDIES:  Recent Labs  06/19/15 1440 06/20/15 0440  WBC 10.9* 11.9*  HGB 10.8* 10.2*  HCT 32.0* 31.0*  PLT 368 321     Recent Labs  06/19/15 1440  06/20/15 0440  NA 134* 136  K 3.8 3.7  CL 103 103  CO2 23 25  GLUCOSE 104* 140*  BUN 17 14  CREATININE 0.81 0.92  CALCIUM 9.6 9.2    STUDIES/RESULTS:  Mr Hip Left W Wo Contrast  06/19/2015   CLINICAL DATA:  Severe left hip pain. Lesion in the proximal left femur.  EXAM: MRI OF THE LEFT HIP WITHOUT AND WITH CONTRAST  TECHNIQUE: Multiplanar, multisequence MR imaging was performed both before and after administration of intravenous contrast.  CONTRAST:  14 cc MultiHance  COMPARISON:  Radiographs dated 06/19/2015 and 01/24/2015 and CT scan dated 06/12/2014  FINDINGS: Muscles and tendons  Muscles and tendons: There is avulsion of the hamstring tendon origins from the left ischial tuberosity with marked edema as well as some fluid surrounding the ischial tuberosity and extending between the left lesser trochanter and the ischium. There is slight enhancement of the soft tissues in that area after contrast administration.  There is a 3.4 x 1.8 x 1.8 cm benign appearing probable chondroid lesion in the proximal left femoral shaft just below the lesser trochanter consistent with a benign enchondroma with no pathologic enhancement after contrast administration.  There is moderate arthritis of the left hip joint with diffuse joint space narrowing and multiple subcortical cysts in the acetabulum.  There are no fractures.  There is no bone destruction.  The patient has severe degenerative disc disease in the lumbar spine with severe spinal stenosis  at L5-S1. Multiple diverticula in the distal colon.  IMPRESSION: 1. Avulsion of all the hamstring tendons from the left ischial tuberosity with secondary edema and fluid in the adjacent soft tissues including the adjacent musculature. 2. Benign chondroid lesion in the proximal left femur, unchanged since 06/12/2014. 3. Moderate osteoarthritis the left hip. 4. Severe spinal stenosis at L5-S1.   Electronically Signed   By: Francene Boyers [name]   On: 06/19/2015 21:42   Dg Hip Unilat With Pelvis 2-3 Views Left  06/19/2015   CLINICAL DATA:  Left hip pain.  History of rheumatoid arthritis.  EXAM: DG HIP (WITH OR WITHOUT PELVIS) 2-3V LEFT  COMPARISON:  01/24/2015 and 06/12/2014  FINDINGS: The pelvic bony ring is intact. There is mild joint space narrowing along the medial and superior aspect the left hip joint. No evidence for an acute fracture or dislocation. Again noted is a lucent lesion in the proximal left femur in the subtrochanteric region with some peripheral sclerosis. This lesion roughly measures 4.9 cm in length and similar to the prior examinations.  IMPRESSION: No acute bone abnormality involving the pelvis or left hip.  Mild joint space narrowing in the left hip joint.  There is a stable lucent lesion involving the proximal left femur. Suspect this represents a benign etiology such as an enchondroma.  If the patient has persistent pain, consider further characterization with MRI to look for an occult injury and further characterization of the lucent bone lesion.   Electronically Signed   By: Richarda Overlie [name]   On: 06/19/2015 14:34    ASSESSMENT:  Left hip avulsion fracture        Principal Problem:   Avulsion of hamstring muscle Active Problems:   HTN (hypertension)   Rheumatoid arthritis   Type 2 diabetes mellitus with other diabetic kidney complication    PLAN: No surgery, needs toe touch weightbearing, pain control, physical therapy and temporary SNF placement for aggressive  rehab    Darah Simkin 06/20/2015. 10:45 AM

## 2015-06-20 NOTE — Evaluation (Signed)
Physical Therapy Evaluation Patient Details Name: [name] [name] MRN: [id number] DOB: Nov 12, 1929 Today's Date: 06/20/2015   History of Present Illness  L hamstring avulsion fx.  Hx TIA, MI, OA, SZ, Bil TKR  Clinical Impression  Pt admitted as above and with functional mobility limitations 2* TDWB on L LE and generalized weakness.  Pt would benefit from follow up rehab at SNF level to maximize IND and safety prior to return home with limited assist.    Follow Up Recommendations SNF    Equipment Recommendations  None recommended by PT    Recommendations for Other Services OT consult     Precautions / Restrictions Precautions Precautions: Fall Restrictions Weight Bearing Restrictions: Yes LLE Weight Bearing: Touchdown weight bearing      Mobility  Bed Mobility Overal bed mobility: Needs Assistance Bed Mobility: Supine to Sit     Supine to sit: Mod assist;+2 for physical assistance;+2 for safety/equipment     General bed mobility comments: cues for sequence, use of R LE to self assist.  Pt assisted to EOB with pad  Transfers Overall transfer level: Needs assistance Equipment used: Rolling walker (2 wheeled) Transfers: Sit to/from Stand Sit to Stand: Min assist;Mod assist;+2 physical assistance;From elevated surface Stand pivot transfers: Min assist;Mod assist;+2 safety/equipment;+2 physical assistance       General transfer comment: cues for LE management and use of UEs to self assist  Ambulation/Gait Ambulation/Gait assistance: Mod assist;+2 physical assistance;+2 safety/equipment Ambulation Distance (Feet): 2 Feet Assistive device: Rolling walker (2 wheeled) Gait Pattern/deviations: Step-to pattern;Decreased step length - right;Decreased step length - left;Shuffle;Trunk flexed     General Gait Details: cues for sequence, posture, position from RW and TDWB.  Pt with diffuculty complying with WB status  Stairs            Wheelchair Mobility     Modified Rankin (Stroke Patients Only)       Balance Overall balance assessment: Needs assistance Sitting-balance support: No upper extremity supported Sitting balance-Leahy Scale: Good     Standing balance support: Bilateral upper extremity supported Standing balance-Leahy Scale: Poor                               Pertinent Vitals/Pain Pain Assessment: 0-10 Pain Score: 2  Pain Location: L hip Pain Descriptors / Indicators: Aching Pain Intervention(s): Limited activity within patient's tolerance;Monitored during session;Premedicated before session    Home Living Family/patient expects to be discharged to:: Skilled nursing facility Living Arrangements: Alone Available Help at Discharge: Personal care attendant (3 hrs a day, 5 days a week)                  Prior Function Level of Independence: Needs assistance   Gait / Transfers Assistance Needed: RW/cane           Hand Dominance        Extremity/Trunk Assessment   Upper Extremity Assessment: Generalized weakness;LUE deficits/detail       LUE Deficits / Details: L shoulder elevation ltd to 70   Lower Extremity Assessment: LLE deficits/detail;Generalized weakness      Cervical / Trunk Assessment: Normal  Communication   Communication: No difficulties  Cognition Arousal/Alertness: Awake/alert Behavior During Therapy: WFL for tasks assessed/performed Overall Cognitive Status: Within Functional Limits for tasks assessed                      General Comments      Exercises  Assessment/Plan    PT Assessment Patient needs continued PT services  PT Diagnosis Difficulty walking   PT Problem List Decreased strength;Decreased range of motion;Decreased activity tolerance;Decreased mobility;Decreased knowledge of use of DME;Pain  PT Treatment Interventions DME instruction;Gait training;Functional mobility training;Therapeutic activities;Therapeutic exercise;Balance  training;Patient/family education   PT Goals (Current goals can be found in the Care Plan section) Acute Rehab PT Goals Patient Stated Goal: Walk again PT Goal Formulation: With patient Time For Goal Achievement: 06/27/15 Potential to Achieve Goals: Fair    Frequency Min 3X/week   Barriers to discharge        Co-evaluation               End of Session Equipment Utilized During Treatment: Gait belt Activity Tolerance: Patient tolerated treatment well Patient left: in chair;with call bell/phone within reach Nurse Communication: Mobility status    Functional Assessment Tool Used: clinical judgement Functional Limitation: Mobility: Walking and moving around Mobility: Walking and Moving Around Current Status [phone]): At least 60 percent but less than 80 percent impaired, limited or restricted Mobility: Walking and Moving Around Goal Status [phone]): At least 40 percent but less than 60 percent impaired, limited or restricted    Time: 6213-0865 PT Time Calculation (min) (ACUTE ONLY): 20 min   Charges:   PT Evaluation $Initial PT Evaluation Tier I: 1 Procedure     PT G Codes:   PT G-Codes **NOT FOR INPATIENT CLASS** Functional Assessment Tool Used: clinical judgement Functional Limitation: Mobility: Walking and moving around Mobility: Walking and Moving Around Current Status ([id number]): At least 60 percent but less than 80 percent impaired, limited or restricted Mobility: Walking and Moving Around Goal Status [phone]): At least 40 percent but less than 60 percent impaired, limited or restricted    Iley Deignan 06/20/2015, 2:55 PM

## 2015-06-20 NOTE — Progress Notes (Signed)
[facility] PROGRESS NOTE  [name] [name] STM:[id number] DOB: 18-Feb-1930 DOA: 06/19/2015 PCP: Kirt Boys, DO  Assessment/Plan: 1. Avulsion of hamstring tendons. -Patient is an 81 year old with multiple comorbidities, at baseline requiring a walker and some assistance with her ADLs. -She denied recent falls or trauma which she presented with complaints of left hip pain. -MRI of left hip revealing an avulsion of the hamstring tendons from the left ischial tuberosity. -[name] poor functional status probable contributor to avulsion of tendons -She was evaluated by physical therapy who did not recommend surgical intervention -Will continue conservative management. Case discussed with social work regarding skilled nursing facility placement.  2.  Type 2 diabetes mellitus. -Her blood sugars remain reasonably controlled -Will restart her metformin at 500 mg by mouth daily  3.  Hypertension. -Continue hydralazine 25 mg by mouth twice a day, labetalol 200 mg by mouth twice a day and lisinopril 10 mg by mouth daily  4.  Rheumatoid arthritis. -She is on methotrexate 15 mg every Thursday along with oral steroids  Code Status: Full code Family Communication:  Disposition Plan: Plan for transition to skilled nursing facility   Consultants: Orthopedic surgery   HPI/Subjective: Patient is a pleasant 81 year old female with a past medical history of hypertension diabetes mellitus was admitted to the medicine service on 06/19/2015 when she presented with complaints of significant left hip pain. She denied history of trauma, falls, or any leg injuries. He was worked up with MRI of left hip which revealed an avulsion of hamstring tendons from left ischial tuberosity. She was evaluated by orthopedic surgery who did not recommend surgical intervention at this time.   Objective: Filed Vitals:   06/20/15 1410  BP: 100/44  Pulse: 85  Temp: 98.2 F (36.8 C)  Resp: 18    Intake/Output  Summary (Last 24 hours) at 06/20/15 1539 Last data filed at 06/20/15 1340  Gross per 24 hour  Intake    420 ml  Output    550 ml  Net   -130 ml   Filed Weights   06/20/15 0000  Weight: 83.1 kg (183 lb 3.2 oz)    Exam:   General:  Patient is awake and alert, reports improvement to her pain symptoms  Cardiovascular: Regular rate and rhythm normal S1-S2 no murmurs rubs or gallops  Respiratory: Normal respiratory effort, lungs are clear to auscultation bilaterally  Abdomen: Soft nontender nondistended positive bowel sounds  Musculoskeletal: She complains of pain with passive and active movement of her left lower extremity   Data Reviewed: Basic Metabolic Panel:  Recent Labs Lab 06/19/15 1440 06/20/15 0440  NA 134* 136  K 3.8 3.7  CL 103 103  CO2 23 25  GLUCOSE 104* 140*  BUN 17 14  CREATININE 0.81 0.92  CALCIUM 9.6 9.2   Liver Function Tests: No results for input(s): AST, ALT, ALKPHOS, BILITOT, PROT, ALBUMIN in the last 168 hours. No results for input(s): LIPASE, AMYLASE in the last 168 hours. No results for input(s): AMMONIA in the last 168 hours. CBC:  Recent Labs Lab 06/19/15 1440 06/20/15 0440  WBC 10.9* 11.9*  NEUTROABS 7.2  --   HGB 10.8* 10.2*  HCT 32.0* 31.0*  MCV 94.1 93.1  PLT 368 321   Cardiac Enzymes: No results for input(s): CKTOTAL, CKMB, CKMBINDEX, TROPONINI in the last 168 hours. BNP (last 3 results) No results for input(s): BNP in the last 8760 hours.  ProBNP (last 3 results) No results for input(s): PROBNP in the last 8760 hours.  CBG:  Recent Labs Lab 06/20/15 0020 06/20/15 0417 06/20/15 0818 06/20/15 1128  GLUCAP 114* 139* 102* 133*    Recent Results (from the past 240 hour(s))  Urine culture     Status: None (Preliminary result)   Collection Time: 06/19/15  5:05 PM  Result Value Ref Range Status   Specimen Description Urine  Final   Special Requests NONE  Final   Culture   Final    NO GROWTH < 24 HOURS Performed at  [facility]    Report Status PENDING  Incomplete     Studies: Mr Hip Left W Wo Contrast  06/19/2015   CLINICAL DATA:  Severe left hip pain. Lesion in the proximal left femur.  EXAM: MRI OF THE LEFT HIP WITHOUT AND WITH CONTRAST  TECHNIQUE: Multiplanar, multisequence MR imaging was performed both before and after administration of intravenous contrast.  CONTRAST:  14 cc MultiHance  COMPARISON:  Radiographs dated 06/19/2015 and 01/24/2015 and CT scan dated 06/12/2014  FINDINGS: Muscles and tendons  Muscles and tendons: There is avulsion of the hamstring tendon origins from the left ischial tuberosity with marked edema as well as some fluid surrounding the ischial tuberosity and extending between the left lesser trochanter and the ischium. There is slight enhancement of the soft tissues in that area after contrast administration.  There is a 3.4 x 1.8 x 1.8 cm benign appearing probable chondroid lesion in the proximal left femoral shaft just below the lesser trochanter consistent with a benign enchondroma with no pathologic enhancement after contrast administration.  There is moderate arthritis of the left hip joint with diffuse joint space narrowing and multiple subcortical cysts in the acetabulum.  There are no fractures.  There is no bone destruction.  The patient has severe degenerative disc disease in the lumbar spine with severe spinal stenosis at L5-S1. Multiple diverticula in the distal colon.  IMPRESSION: 1. Avulsion of all the hamstring tendons from the left ischial tuberosity with secondary edema and fluid in the adjacent soft tissues including the adjacent musculature. 2. Benign chondroid lesion in the proximal left femur, unchanged since 06/12/2014. 3. Moderate osteoarthritis the left hip. 4. Severe spinal stenosis at L5-S1.   Electronically Signed   By: Francene Boyers [name]   On: 06/19/2015 21:42   Dg Hip Unilat With Pelvis 2-3 Views Left  06/19/2015   CLINICAL DATA:  Left hip pain.   History of rheumatoid arthritis.  EXAM: DG HIP (WITH OR WITHOUT PELVIS) 2-3V LEFT  COMPARISON:  01/24/2015 and 06/12/2014  FINDINGS: The pelvic bony ring is intact. There is mild joint space narrowing along the medial and superior aspect the left hip joint. No evidence for an acute fracture or dislocation. Again noted is a lucent lesion in the proximal left femur in the subtrochanteric region with some peripheral sclerosis. This lesion roughly measures 4.9 cm in length and similar to the prior examinations.  IMPRESSION: No acute bone abnormality involving the pelvis or left hip.  Mild joint space narrowing in the left hip joint.  There is a stable lucent lesion involving the proximal left femur. Suspect this represents a benign etiology such as an enchondroma.  If the patient has persistent pain, consider further characterization with MRI to look for an occult injury and further characterization of the lucent bone lesion.   Electronically Signed   By: Richarda Overlie [name]   On: 06/19/2015 14:34    Scheduled Meds: . allopurinol  100 mg Oral Daily  . ferrous sulfate  325 mg Oral Q breakfast  . folic acid  2 mg Oral Daily  . hydrALAZINE  25 mg Oral BID  . insulin aspart  0-9 Units Subcutaneous 6 times per day  . labetalol  200 mg Oral BID  . lisinopril  10 mg Oral Daily  . methotrexate  15 mg Oral Q Thu  . [START ON 07/11/2015] predniSONE  2 mg Oral Q breakfast  . predniSONE  3 mg Oral Q breakfast   Continuous Infusions:   Principal Problem:   Avulsion of hamstring muscle Active Problems:   HTN (hypertension)   Rheumatoid arthritis   Type 2 diabetes mellitus with other diabetic kidney complication   Avulsion of left hamstring muscle    Time spent: 25 minutes    Jeralyn Bennett  [facility] Pager [phone]. If 7PM-7AM, please contact night-coverage at [email or web address], password [email or web address] 06/20/2015, 3:39 PM  LOS: 1 day

## 2015-06-20 NOTE — Care Management Note (Signed)
Case Management Note  Patient Details  Name: [name] [name] MRN: [id number] Date of Birth: 11-19-1929  Subjective/Objective:                   1. Avulsion of hamstring tendons. Action/Plan: Discharge planning  Expected Discharge Date:  06/21/15               Expected Discharge Plan:  Home w Home Health Services  In-House Referral:     Discharge planning Services  CM Consult  Post Acute Care Choice:  Home Health Choice offered to:     DME Arranged:    DME Agency:     HH Arranged:  PT, OT HH Agency:  [facility]  Status of Service:  In process, will continue to follow  Medicare Important Message Given:    Date Medicare IM Given:    Medicare IM give by:    Date Additional Medicare IM Given:    Additional Medicare Important Message give by:     If discussed at Long Length of Stay Meetings, dates discussed:    Additional Comments: CM spoke with CSW as pt was exploring SNF but pt is going home and has been active with [place] in the past.  Cm confirmed with both pt and pt's daughter, [name] [name] (via phone) [name] their choice of home health agency.  Orders requested.  Referral called to [employer], Tim.  CM will continue to follow for orders and face to face. Yves Dill, RN 06/20/2015, 4:17 PM

## 2015-06-21 ENCOUNTER — Ambulatory Visit: Payer: Medicare Other | Admitting: Internal Medicine

## 2015-06-21 DIAGNOSIS — M069 Rheumatoid arthritis, unspecified: Secondary | ICD-10-CM | POA: Diagnosis not present

## 2015-06-21 DIAGNOSIS — S76892A Other injury of other specified muscles, fascia and tendons at thigh level, left thigh, initial encounter: Secondary | ICD-10-CM | POA: Diagnosis not present

## 2015-06-21 DIAGNOSIS — S76392A Other specified injury of muscle, fascia and tendon of the posterior muscle group at thigh level, left thigh, initial encounter: Secondary | ICD-10-CM

## 2015-06-21 DIAGNOSIS — E1129 Type 2 diabetes mellitus with other diabetic kidney complication: Secondary | ICD-10-CM

## 2015-06-21 DIAGNOSIS — I10 Essential (primary) hypertension: Secondary | ICD-10-CM

## 2015-06-21 DIAGNOSIS — S76392D Other specified injury of muscle, fascia and tendon of the posterior muscle group at thigh level, left thigh, subsequent encounter: Secondary | ICD-10-CM

## 2015-06-21 LAB — BASIC METABOLIC PANEL
Anion gap: 8 (ref 5–15)
BUN: 22 mg/dL — ABNORMAL HIGH (ref 6–20)
CO2: 24 mmol/L (ref 22–32)
Calcium: 9.3 mg/dL (ref 8.9–10.3)
Chloride: 102 mmol/L (ref 101–111)
Creatinine, Ser: 0.87 mg/dL (ref 0.44–1.00)
GFR calc Af Amer: >60 mL/min (ref >60)
GFR calc non Af Amer: 60 mL/min — ABNORMAL LOW (ref >60)
Glucose, Bld: 130 mg/dL — ABNORMAL HIGH (ref 65–99)
Potassium: 3.6 mmol/L (ref 3.5–5.1)
Sodium: 134 mmol/L — ABNORMAL LOW (ref 135–145)

## 2015-06-21 LAB — CBC
HCT: 31.3 % — ABNORMAL LOW (ref 36.0–46.0)
Hemoglobin: 10.2 g/dL — ABNORMAL LOW (ref 12.0–15.0)
MCH: 30.4 pg (ref 26.0–34.0)
MCHC: 32.6 g/dL (ref 30.0–36.0)
MCV: 93.2 fL (ref 78.0–100.0)
Platelets: 299 10*3/uL (ref 150–400)
RBC: 3.36 MIL/uL — ABNORMAL LOW (ref 3.87–5.11)
RDW: 17.2 % — ABNORMAL HIGH (ref 11.5–15.5)
WBC: 11.9 10*3/uL — ABNORMAL HIGH (ref 4.0–10.5)

## 2015-06-21 LAB — GLUCOSE, CAPILLARY
Glucose-Capillary: 119 mg/dL — ABNORMAL HIGH (ref 65–99)
Glucose-Capillary: 146 mg/dL — ABNORMAL HIGH (ref 65–99)

## 2015-06-21 LAB — URINE CULTURE

## 2015-06-21 MED ORDER — HYDROCODONE-ACETAMINOPHEN 5-325 MG PO TABS: 1.0000 | ORAL_TABLET | Freq: Four times a day (QID) | ORAL | Status: DC | PRN

## 2015-06-21 NOTE — Progress Notes (Signed)
SPORTS MEDICINE AND JOINT REPLACEMENT  [name] Spurling, MD   Altamese Cabal, PA-C [address] [place], [place], [place]  [address]                             [phone]   PROGRESS NOTE  Subjective:  negative for Chest Pain  negative for Shortness of Breath  negative for Nausea/Vomiting   negative for Calf Pain  negative for Bowel Movement   Tolerating Diet: yes         Patient reports pain as 4 on 0-10 scale.    Objective: Vital signs in last 24 hours:   Patient Vitals for the past 24 hrs:  BP Temp Temp src Pulse Resp SpO2  06/21/15 0426 135/64 mmHg 98.4 F (36.9 C) Oral 64 16 97 %  06/20/15 2144 121/67 mmHg 98.8 F (37.1 C) Oral (!) 43 18 96 %  06/20/15 1410 (!) 100/44 mmHg 98.2 F (36.8 C) Oral 85 18 98 %    @flow {1959:LAST@   Intake/Output from previous day:   08/11 0701 - 08/12 0700 In: 540 [P.O.:540] Out: 1300 [Urine:1300]   Intake/Output this shift:   08/12 0701 - 08/12 1900 In: -  Out: 300 [Urine:300]   Intake/Output      08/11 0701 - 08/12 0700 08/12 0701 - 08/13 0700   P.O. 540    Total Intake(mL/kg) 540 (6.5)    Urine (mL/kg/hr) 1300 (0.7) 300 (1.1)   Stool 0 (0)    Total Output 1300 300   Net -760 -300        Urine Occurrence 2 x    Stool Occurrence 0 x       LABORATORY DATA:  Recent Labs  06/19/15 1440 06/20/15 0440 06/21/15 0428  WBC 10.9* 11.9* 11.9*  HGB 10.8* 10.2* 10.2*  HCT 32.0* 31.0* 31.3*  PLT 368 321 299    Recent Labs  06/19/15 1440 06/20/15 0440 06/21/15 0428  NA 134* 136 134*  K 3.8 3.7 3.6  CL 103 103 102  CO2 23 25 24   BUN 17 14 22*  CREATININE 0.81 0.92 0.87  GLUCOSE 104* 140* 130*  CALCIUM 9.6 9.2 9.3   Lab Results  Component Value Date   INR 0.96 07/28/2013    Examination:  General appearance: alert, cooperative and no distress Extremities: extremities normal, atraumatic, no cyanosis or edema and Homans sign is negative, no sign of DVT  Wound Exam: clean, dry, intact     Motor Exam: EHL and  FHL Intact  Sensory Exam: Deep Peroneal normal   Assessment:          ADDITIONAL DIAGNOSIS:  Principal Problem:   Avulsion of hamstring muscle Active Problems:   HTN (hypertension)   Rheumatoid arthritis   Type 2 diabetes mellitus with other diabetic kidney complication   Avulsion of left hamstring muscle     Plan: Physical Therapy as ordered Weight Bearing as Tolerated (WBAT)  DVT Prophylaxis:  None  DISCHARGE PLAN: Home  DISCHARGE NEEDS: HHPT and Walker   Okay to discharge from ortho standpoint  Follow up in July 02, 2015         Artemis Loyal 06/21/2015, 10:12 AM

## 2015-06-21 NOTE — Discharge Summary (Signed)
Physician Discharge Summary  JOSS DEEG EML:[id number] DOB: 08/14/1930 DOA: 06/19/2015  PCP: Kirt Boys, DO  Admit date: 06/19/2015 Discharge date: 06/21/2015   Recommendations for Outpatient Follow-Up:   1. Patient was discharged home with home physical therapy and DME including a walker and a bedside commode.   Discharge Diagnosis:   Principal Problem:    Avulsion of hamstring muscle Active Problems:    HTN (hypertension)    Rheumatoid arthritis    Type 2 diabetes mellitus with other diabetic kidney complication    Avulsion of left hamstring muscle   Discharge disposition:  Home.    Discharge Condition: Improved.  Diet recommendation: Carbohydrate-modified.   History of Present Illness:   Patient is a pleasant 81 year old female with a past medical history of hypertension diabetes mellitus was admitted to the medicine service on 06/19/2015 when she presented with complaints of significant left hip pain. She denied history of trauma, falls, or any leg injuries. He was worked up with MRI of left hip which revealed an avulsion of hamstring tendons from left ischial tuberosity. She was evaluated by orthopedic surgery who did not recommend surgical intervention at this time.    Hospital Course by Problem:   1. Avulsion of hamstring tendons. -Patient is an 81 year old with multiple comorbidities, at baseline requiring a walker and some assistance with her ADLs. -She denied recent falls or trauma which she presented with complaints of left hip pain. -MRI of left hip revealing an avulsion of the hamstring tendons from the left ischial tuberosity. -[name] poor functional status probable contributor to avulsion of tendons. -She was evaluated by orthopedic surgery who did not recommend surgical intervention. -Will continue conservative management. Home physical therapy set up.  2. Type 2 diabetes mellitus. -Her blood sugars remain reasonably  controlled. -Continue metformin at 500 mg by mouth daily.  3. Hypertension. -Continue hydralazine 25 mg by mouth twice a day, labetalol 200 mg by mouth twice a day and lisinopril 10 mg by mouth daily.  4. Rheumatoid arthritis. -She is on methotrexate 15 mg every Thursday along with oral steroids.    Medical Consultants:    Orthopedic Surgery   Discharge Exam:   Filed Vitals:   06/21/15 0426  BP: 135/64  Pulse: 64  Temp: 98.4 F (36.9 C)  Resp: 16   Filed Vitals:   06/20/15 0945 06/20/15 1410 06/20/15 2144 06/21/15 0426  BP: 137/62 100/44 121/67 135/64  Pulse: 92 85 43 64  Temp:  98.2 F (36.8 C) 98.8 F (37.1 C) 98.4 F (36.9 C)  TempSrc:  Oral Oral Oral  Resp:  18 18 16   Height:      Weight:      SpO2:  98% 96% 97%    Gen:  NAD Cardiovascular:  RRR, No M/R/G Respiratory: Lungs CTAB Gastrointestinal: Abdomen soft, NT/ND with normal active bowel sounds. Extremities: No C/E/C   The results of significant diagnostics from this hospitalization (including imaging, microbiology, ancillary and laboratory) are listed below for reference.     Procedures and Diagnostic Studies:   Mr Hip Left W Wo Contrast  06/19/2015   CLINICAL DATA:  Severe left hip pain. Lesion in the proximal left femur.  EXAM: MRI OF THE LEFT HIP WITHOUT AND WITH CONTRAST  TECHNIQUE: Multiplanar, multisequence MR imaging was performed both before and after administration of intravenous contrast.  CONTRAST:  14 cc MultiHance  COMPARISON:  Radiographs dated 06/19/2015 and 01/24/2015 and CT scan dated 06/12/2014  FINDINGS: Muscles and tendons  Muscles and  tendons: There is avulsion of the hamstring tendon origins from the left ischial tuberosity with marked edema as well as some fluid surrounding the ischial tuberosity and extending between the left lesser trochanter and the ischium. There is slight enhancement of the soft tissues in that area after contrast administration.  There is a 3.4 x 1.8 x 1.8  cm benign appearing probable chondroid lesion in the proximal left femoral shaft just below the lesser trochanter consistent with a benign enchondroma with no pathologic enhancement after contrast administration.  There is moderate arthritis of the left hip joint with diffuse joint space narrowing and multiple subcortical cysts in the acetabulum.  There are no fractures.  There is no bone destruction.  The patient has severe degenerative disc disease in the lumbar spine with severe spinal stenosis at L5-S1. Multiple diverticula in the distal colon.  IMPRESSION: 1. Avulsion of all the hamstring tendons from the left ischial tuberosity with secondary edema and fluid in the adjacent soft tissues including the adjacent musculature. 2. Benign chondroid lesion in the proximal left femur, unchanged since 06/12/2014. 3. Moderate osteoarthritis the left hip. 4. Severe spinal stenosis at L5-S1.   Electronically Signed   By: Francene Boyers [name]   On: 06/19/2015 21:42   Dg Hip Unilat With Pelvis 2-3 Views Left  06/19/2015   CLINICAL DATA:  Left hip pain.  History of rheumatoid arthritis.  EXAM: DG HIP (WITH OR WITHOUT PELVIS) 2-3V LEFT  COMPARISON:  01/24/2015 and 06/12/2014  FINDINGS: The pelvic bony ring is intact. There is mild joint space narrowing along the medial and superior aspect the left hip joint. No evidence for an acute fracture or dislocation. Again noted is a lucent lesion in the proximal left femur in the subtrochanteric region with some peripheral sclerosis. This lesion roughly measures 4.9 cm in length and similar to the prior examinations.  IMPRESSION: No acute bone abnormality involving the pelvis or left hip.  Mild joint space narrowing in the left hip joint.  There is a stable lucent lesion involving the proximal left femur. Suspect this represents a benign etiology such as an enchondroma.  If the patient has persistent pain, consider further characterization with MRI to look for an occult injury and  further characterization of the lucent bone lesion.   Electronically Signed   By: Richarda Overlie [name]   On: 06/19/2015 14:34     Labs:   Basic Metabolic Panel:  Recent Labs Lab 06/19/15 1440 06/20/15 0440 06/21/15 0428  NA 134* 136 134*  K 3.8 3.7 3.6  CL 103 103 102  CO2 23 25 24   GLUCOSE 104* 140* 130*  BUN 17 14 22*  CREATININE 0.81 0.92 0.87  CALCIUM 9.6 9.2 9.3   GFR Estimated Creatinine Clearance: 51.1 mL/min (by C-G formula based on Cr of 0.87).  CBC:  Recent Labs Lab 06/19/15 1440 06/20/15 0440 06/21/15 0428  WBC 10.9* 11.9* 11.9*  NEUTROABS 7.2  --   --   HGB 10.8* 10.2* 10.2*  HCT 32.0* 31.0* 31.3*  MCV 94.1 93.1 93.2  PLT 368 321 299   CBG:  Recent Labs Lab 06/20/15 1128 06/20/15 1618 06/20/15 2137 06/21/15 0709 06/21/15 1150  GLUCAP 133* 191* 112* 119* 146*   Microbiology Recent Results (from the past 240 hour(s))  Urine culture     Status: None   Collection Time: 06/19/15  5:05 PM  Result Value Ref Range Status   Specimen Description Urine  Final   Special Requests NONE  Final  Culture   Final    MULTIPLE SPECIES PRESENT, SUGGEST RECOLLECTION Performed at [facility]    Report Status 06/21/2015 FINAL  Final     Discharge Instructions:   Discharge Instructions    Call MD for:  severe uncontrolled pain    Complete by:  As directed      Diet - low sodium heart healthy    Complete by:  As directed      Diet Carb Modified    Complete by:  As directed      Increase activity slowly    Complete by:  As directed      Walker     Complete by:  As directed             Medication List    STOP taking these medications        oxyCODONE 5 MG immediate release tablet  Commonly known as:  Oxy IR/ROXICODONE      TAKE these medications        Adalimumab 40 MG/0.8ML Pskt  Inject into the skin. Take 0.8 ml every two weeks     albuterol 108 (90 BASE) MCG/ACT inhaler  Commonly known as:  PROVENTIL HFA;VENTOLIN HFA  Inhale 2  puffs into the lungs every 6 (six) hours as needed for wheezing.     allopurinol 100 MG tablet  Commonly known as:  ZYLOPRIM  TAKE 1 TABLET BY MOUTH EVERY DAY FOR GOUT     aspirin EC 81 MG tablet  Take 81 mg by mouth daily.     calcium-vitamin D 500-200 MG-UNIT per tablet  Commonly known as:  OSCAL WITH D  Take 1 tablet by mouth 2 (two) times daily.     cloNIDine 0.1 MG tablet  Commonly known as:  CATAPRES  Take 0.1 mg by mouth daily as needed (blood presure is above 160). For SBP>160     ferrous sulfate 325 (65 FE) MG tablet  Take one tablet by mouth three times daily for anemia     folic acid 1 MG tablet  Commonly known as:  FOLVITE  Take 2 mg by mouth daily.     glucose blood test strip  Commonly known as:  ONE TOUCH ULTRA TEST  Check blood sugar once daily DX 250.00     hydrALAZINE 25 MG tablet  Commonly known as:  APRESOLINE  Take 25 mg by mouth 2 (two) times daily.     HYDROcodone-acetaminophen 5-325 MG per tablet  Commonly known as:  NORCO/VICODIN  Take 1-2 tablets by mouth every 6 (six) hours as needed for moderate pain.     labetalol 100 MG tablet  Commonly known as:  NORMODYNE  Take 2 tablets (200 mg total) by mouth 2 (two) times daily.     lisinopril 10 MG tablet  Commonly known as:  PRINIVIL,ZESTRIL  Take one tablet by mouth once daily for blood pressure and protect kidneys.     metFORMIN 500 MG tablet  Commonly known as:  GLUCOPHAGE  TAKE 1 TABLET BY MOUTH EVERY DAY FOR DIABETES     methotrexate 2.5 MG tablet  Commonly known as:  RHEUMATREX  Provided by rheumatology- started on 4 tablets every week for 2 weeks, then 6 tab every week for 2 weeks, then 8 tab every week     OVER THE COUNTER MEDICATION  Take 1 tablet by mouth 2 (two) times daily. Wellness formula for immune support     OVER THE COUNTER MEDICATION  Take 5 mLs  by mouth every morning. Chloraphil liquid, mix in apply juice     polyvinyl alcohol 1.4 % ophthalmic solution  Commonly known  as:  LIQUIFILM TEARS  Place 1 drop into both eyes as needed (for dry eyes).     predniSONE 1 MG tablet  Commonly known as:  DELTASONE  Take 2-4 mg by mouth daily with breakfast. As directed: Directions where to take- 4mg  every morning in July, then 3mg  every morning in august and 2mg  every morning in September.     traMADol 50 MG tablet  Commonly known as:  ULTRAM  take 1-2 tablets by mouth every 6 hours if needed for MODERATE pain     triamcinolone cream 0.1 %  Commonly known as:  KENALOG  APPLY TO AFFECTED AREA TWICE DAILY AS NEEDED FOR IRRITATION     vitamin C 500 MG tablet  Commonly known as:  ASCORBIC ACID  Take 500 mg by mouth daily.     Vitamin D-3 1000 UNITS Caps  Take 1 capsule by mouth daily.           Follow-up Information    Follow up with [place], DO. Schedule an appointment as soon as possible for a visit in 1 week.   Specialty:  Internal Medicine   Why:  To evaluate you and order an MRI of your hip.   Contact information:   [address] [place] Kirt Boys [phone]       Follow up with [place], MD. Call on 07/02/2015.   Specialty:  Orthopedic Surgery   Contact information:   [address] [place] 07/04/2015 [address] [phone]        Time coordinating discharge: 35 minutes.  Signed:  Laela Deviney  Pager [phone] [facility] 06/21/2015, 7:28 PM

## 2015-06-21 NOTE — Discharge Instructions (Signed)
You may need an MRI of your hip. Please follow-up with your regular physician to facilitate this.  Hip Pain Your hip is the joint between your upper legs and your lower pelvis. The bones, cartilage, tendons, and muscles of your hip joint perform a lot of work each day supporting your body weight and allowing you to move around. Hip pain can range from a minor ache to severe pain in one or both of your hips. Pain may be felt on the inside of the hip joint near the groin, or the outside near the buttocks and upper thigh. You may have swelling or stiffness as well.  HOME CARE INSTRUCTIONS   Take medicines only as directed by your health care provider.  Apply ice to the injured area:  Put ice in a plastic bag.  Place a towel between your skin and the bag.  Leave the ice on for 15-20 minutes at a time, 3-4 times a day.  Keep your leg raised (elevated) when possible to lessen swelling.  Avoid activities that cause pain.  Follow specific exercises as directed by your health care provider.  Sleep with a pillow between your legs on your most comfortable side.  Record how often you have hip pain, the location of the pain, and what it feels like. SEEK MEDICAL CARE IF:   You are unable to put weight on your leg.  Your hip is red or swollen or very tender to touch.  Your pain or swelling continues or worsens after 1 week.  You have increasing difficulty walking.  You have a fever. SEEK IMMEDIATE MEDICAL CARE IF:   You have fallen.  You have a sudden increase in pain and swelling in your hip. MAKE SURE YOU:   Understand these instructions.  Will watch your condition.  Will get help right away if you are not doing well or get worse. Document Released: 04/15/2010 Document Revised: 03/12/2014 Document Reviewed: 06/22/2013 [facility] Patient Information 2015 [place], [place]. This information is not intended to replace advice given to you by your health care provider. Make sure you  discuss any questions you have with your health care provider.

## 2015-06-21 NOTE — Care Management Note (Signed)
Case Management Note  Patient Details  Name: [name] [name] MRN: [id number] Date of Birth: 01-Aug-1930    Expected Discharge Date:                  Expected Discharge Plan:  Home w Home Health Services  In-House Referral:     Discharge planning Services  CM Consult  Post Acute Care Choice:  Home Health Choice offered to:  Adult Children, Patient  DME Arranged:  3-N-1 DME Agency:  [facility]  HH Arranged:  PT, OT, RN,SW HH Agency:   [facility]  Status of Service:  Completed, signed off  Medicare Important Message Given:    Date Medicare IM Given:    Medicare IM give by:    Date Additional Medicare IM Given:    Additional Medicare Important Message give by:     If discussed at Long Length of Stay Meetings, dates discussed:    Additional Comments: [name] requested to change HH agencies to [facility], as it was recommended to her. [employer] rep alerted of change. [facility] rep given referral and [facility] DME contacted for 3 in 1. No other DC needs noted. RN made aware. Bartholome Bill, RN 06/21/2015, 1:51 PM

## 2015-06-27 NOTE — Telephone Encounter (Signed)
Medication refilled and patient called.

## 2015-07-01 ENCOUNTER — Other Ambulatory Visit: Payer: Self-pay | Admitting: Internal Medicine

## 2015-07-03 ENCOUNTER — Encounter: Payer: Self-pay | Admitting: Internal Medicine

## 2015-07-03 ENCOUNTER — Ambulatory Visit (INDEPENDENT_AMBULATORY_CARE_PROVIDER_SITE_OTHER): Payer: Medicare Other | Admitting: Internal Medicine

## 2015-07-03 VITALS — BP 142/76 | HR 101 | Temp 97.9°F | Resp 20 | Ht 64.0 in | Wt 183.0 lb

## 2015-07-03 DIAGNOSIS — M1000 Idiopathic gout, unspecified site: Secondary | ICD-10-CM | POA: Diagnosis not present

## 2015-07-03 DIAGNOSIS — M069 Rheumatoid arthritis, unspecified: Secondary | ICD-10-CM

## 2015-07-03 DIAGNOSIS — E1129 Type 2 diabetes mellitus with other diabetic kidney complication: Secondary | ICD-10-CM

## 2015-07-03 DIAGNOSIS — I10 Essential (primary) hypertension: Secondary | ICD-10-CM | POA: Diagnosis not present

## 2015-07-03 DIAGNOSIS — Z5181 Encounter for therapeutic drug level monitoring: Secondary | ICD-10-CM | POA: Diagnosis not present

## 2015-07-03 DIAGNOSIS — S76392A Other specified injury of muscle, fascia and tendon of the posterior muscle group at thigh level, left thigh, initial encounter: Secondary | ICD-10-CM

## 2015-07-03 DIAGNOSIS — J302 Other seasonal allergic rhinitis: Secondary | ICD-10-CM | POA: Diagnosis not present

## 2015-07-03 DIAGNOSIS — M797 Fibromyalgia: Secondary | ICD-10-CM

## 2015-07-03 NOTE — Patient Instructions (Signed)
Continue current medications as ordered  Follow up with Rheumatology to discuss continuation of Humira vs changing to another med  Will call with lab results  Follow up in 3 mos for routine visit

## 2015-07-03 NOTE — Progress Notes (Signed)
Patient ID: [name] [name], female   DOB: December 13, 1929, 81 y.o.   MRN: [id number]    Location:    [facility]   Place of Service:   OFFICE  Chief Complaint  Patient presents with  . Medical Management of Chronic Issues  . Hospitalization Follow-up    HPI:  81 yo female seen today for f/u. She was admitted to hospital for left avulsion hamstring, RA, HTN and DM II. She opted to be treated conservatively with home PT and bedside commode and Geri walker. She had left hip MRI which revealed left hamstring avulsion. Denies fall. She is requesting Rx for new walker as the wheels on her current one are worn down  She feels weak and has sinus pressure/congestion. She is currently not taking any meds for seasonal allergy  She continues to take daily prednisone and weekly humira and MTX. She is mx by Rheum Dr Estanislado Pandy. Pain controlled on norco. She has hand pain today.  BP controlled on lisinopril, labetalol, hydralazine, clonidine.  BS stable on metformin. No low BS reactions  No gout attacks. She takes allopurinol  Past Medical History  Diagnosis Date  . Hypertension   . First degree atrioventricular block   . Onychia and paronychia of toe   . Muscle weakness (generalized)   . Allergic rhinitis due to pollen 04/27/2007  . Gout, unspecified 04/19/2013  . Disorder of bone and cartilage, unspecified   . Edema 05/03/2013  . Hyperlipidemia 04/27/2007  . Unspecified essential hypertension 08/08/2013  . Coronary atherosclerosis of native coronary artery   . Other malaise and fatigue   . Cough   . Other fall   . Unspecified vitamin D deficiency   . TIA (transient ischemic attack)     "a few one summer" (08/07/2013)  . Myocardial infarction ~ 1970  . Exertional shortness of breath     "sometimes" (08/07/2013)  . Type II diabetes mellitus     "fasting 90-110s" (08/07/2013)  . Osteoarthrosis, unspecified whether generalized or localized, unspecified site 04/27/2007  . Prepatellar bursitis   .  Arthritis     "in q joint" (08/07/2013)  . Stroke 01/17/2014  . Seizures   . Muscle spasm     Past Surgical History  Procedure Laterality Date  . Tonsillectomy  08/1974  . Replacement total knee Right 09/2005  . Shoulder open rotator cuff repair Right 07/1999  . Appendectomy  1960  . Abdominal hysterectomy  04/1980  . Transthoracic echocardiogram  2003    EF 55-65%; mild concentric LVH  . Joint replacement    . Total knee arthroplasty Left 08/07/2013  . Cataract extraction w/ intraocular lens  implant, bilateral Bilateral ~ 2012  . Total knee arthroplasty Left 08/07/2013    Procedure: TOTAL KNEE ARTHROPLASTY- LEFT;  Surgeon: Vickey Huger, MD;  Location: [facility];  Service: Orthopedics;  Laterality: Left;  . Cardiac catheterization  2003    Patient Care Team: Gildardo Cranker, DO as PCP - General (Internal Medicine)  Social History   Social History  . Marital Status: Widowed    Spouse Name: N/A  . Number of Children: N/A  . Years of Education: N/A   Occupational History  . Not on file.   Social History Main Topics  . Smoking status: Never Smoker   . Smokeless tobacco: Never Used  . Alcohol Use: No  . Drug Use: No  . Sexual Activity: No   Other Topics Concern  . Not on file   Social History Narrative  Widowed   Walks with cane     reports that she has never smoked. She has never used smokeless tobacco. She reports that she does not drink alcohol or use illicit drugs.  Allergies  Allergen Reactions  . Cetirizine Hcl Other (See Comments)    unknown  . Codeine Other (See Comments)    unknown  . Fish Allergy   . Indomethacin Other (See Comments)    unknown  . Lyrica [Pregabalin]     hallucinations  . Methyldopa Other (See Comments)    unknown  . Shellfish Allergy Hives  . Tomato Rash    Medications: [name] Medications  New Prescriptions   No medications on file  Previous Medications   ADALIMUMAB 40 MG/0.8ML PSKT    Inject into the skin. Take 0.8 ml every  two weeks   ALBUTEROL (PROVENTIL HFA;VENTOLIN HFA) 108 (90 BASE) MCG/ACT INHALER    Inhale 2 puffs into the lungs every 6 (six) hours as needed for wheezing.   ALLOPURINOL (ZYLOPRIM) 100 MG TABLET    TAKE 1 TABLET BY MOUTH EVERY DAY FOR GOUT   ASPIRIN EC 81 MG TABLET    Take 81 mg by mouth daily.   CALCIUM-VITAMIN D (OSCAL WITH D) 500-200 MG-UNIT PER TABLET    Take 1 tablet by mouth 2 (two) times daily.   CHOLECALCIFEROL (VITAMIN D-3) 1000 UNITS CAPS    Take 1 capsule by mouth daily.   CLONIDINE (CATAPRES) 0.1 MG TABLET    Take 0.1 mg by mouth daily as needed (blood presure is above 160). For SBP>160   FERROUS SULFATE 325 (65 FE) MG TABLET    Take one tablet by mouth three times daily for anemia   FOLIC ACID (FOLVITE) 1 MG TABLET    Take 2 mg by mouth daily.    GLUCOSE BLOOD (ONE TOUCH ULTRA TEST) TEST STRIP    Check blood sugar once daily DX 250.00   HUMIRA PEN 40 MG/0.8ML PNKT       HYDRALAZINE (APRESOLINE) 25 MG TABLET    take 1 tablet by mouth four times a day   HYDROCODONE-ACETAMINOPHEN (NORCO/VICODIN) 5-325 MG PER TABLET    Take 1-2 tablets by mouth every 6 (six) hours as needed for moderate pain.   LABETALOL (NORMODYNE) 100 MG TABLET    Take 2 tablets (200 mg total) by mouth 2 (two) times daily.   LISINOPRIL (PRINIVIL,ZESTRIL) 10 MG TABLET    Take one tablet by mouth once daily for blood pressure and protect kidneys.   METFORMIN (GLUCOPHAGE) 500 MG TABLET    TAKE 1 TABLET BY MOUTH EVERY DAY FOR DIABETES   METHOTREXATE (RHEUMATREX) 2.5 MG TABLET    Provided by rheumatology- started on 4 tablets every week for 2 weeks, then 6 tab every week for 2 weeks, then 8 tab every week   METHOTREXATE (RHEUMATREX) 2.5 MG TABLET    6 tablets once a week(thursday)   OVER THE COUNTER MEDICATION    Take 1 tablet by mouth 2 (two) times daily. Wellness formula for immune support   OVER THE COUNTER MEDICATION    Take 5 mLs by mouth every morning. Chloraphil liquid, mix in apply juice   POLYVINYL ALCOHOL  (LIQUIFILM TEARS) 1.4 % OPHTHALMIC SOLUTION    Place 1 drop into both eyes as needed (for dry eyes).   PREDNISONE (DELTASONE) 1 MG TABLET    Take 2-4 mg by mouth daily with breakfast. As directed: Directions where to take- 41m every morning in July, then 360mevery  morning in august and 64m every morning in September.   TRAMADOL (ULTRAM) 50 MG TABLET    take 1-2 tablets by mouth every 6 hours if needed for MODERATE pain   TRIAMCINOLONE CREAM (KENALOG) 0.1 %    APPLY TO AFFECTED AREA TWICE DAILY AS NEEDED FOR IRRITATION   VITAMIN C (ASCORBIC ACID) 500 MG TABLET    Take 500 mg by mouth daily.  Modified Medications   No medications on file  Discontinued Medications   No medications on file    Review of Systems  Constitutional: Negative for fever, chills, diaphoresis, activity change, appetite change and fatigue.  HENT: Positive for sinus pressure. Negative for ear pain and sore throat.   Eyes: Negative for visual disturbance.  Respiratory: Negative for cough, chest tightness and shortness of breath.   Cardiovascular: Negative for chest pain, palpitations and leg swelling.  Gastrointestinal: Negative for nausea, vomiting, abdominal pain, diarrhea, constipation and blood in stool.  Genitourinary: Negative for dysuria.  Musculoskeletal: Positive for myalgias, back pain, joint swelling, arthralgias, gait problem and neck pain.  Neurological: Positive for weakness. Negative for dizziness, tremors, numbness and headaches.  Psychiatric/Behavioral: Negative for sleep disturbance. The patient is not nervous/anxious.     Filed Vitals:   07/03/15 1400  BP: 142/76  Pulse: 101  Temp: 97.9 F (36.6 C)  TempSrc: Oral  Resp: 20  Height: 5' 4"  (1.626 m)  Weight: 183 lb (83.008 kg)  SpO2: 94%   Body mass index is 31.4 kg/(m^2).  Physical Exam  Constitutional: She is oriented to person, place, and time. She appears well-developed and well-nourished.  Looks well in NAD. Daughter present  HENT:    Mouth/Throat: Oropharynx is clear and moist. No oropharyngeal exudate.  Eyes: Pupils are equal, round, and reactive to light. No scleral icterus.  Neck: Neck supple. Carotid bruit is not present. No tracheal deviation present. No thyromegaly present.  Cardiovascular: Normal rate, regular rhythm, normal heart sounds and intact distal pulses.  Exam reveals no gallop and no friction rub.   No murmur heard. No LE edema b/l. no calf TTP.   Pulmonary/Chest: Effort normal and breath sounds normal. No stridor. No respiratory distress. She has no wheezes. She has no rales.  Abdominal: Soft. Bowel sounds are normal. She exhibits no distension and no mass. There is no hepatomegaly. There is no tenderness. There is no rebound and no guarding.  Musculoskeletal: She exhibits edema and tenderness.  Small and large joint swelling and deformities. Left mid thigh fascial dip. No TTP. Gait antalgic  Lymphadenopathy:    She has no cervical adenopathy.  Neurological: She is alert and oriented to person, place, and time.  Skin: Skin is warm and dry. No rash noted.  Psychiatric: She has a normal mood and affect. Her behavior is normal. Judgment and thought content normal.     Labs reviewed: Admission on 06/19/2015, Discharged on 06/21/2015  Component Date Value Ref Range Status  . WBC 06/19/2015 10.9* 4.0 - 10.5 K/uL Final  . RBC 06/19/2015 3.40* 3.87 - 5.11 MIL/uL Final  . Hemoglobin 06/19/2015 10.8* 12.0 - 15.0 g/dL Final  . HCT 06/19/2015 32.0* 36.0 - 46.0 % Final  . MCV 06/19/2015 94.1  78.0 - 100.0 fL Final  . MCH 06/19/2015 31.8  26.0 - 34.0 pg Final  . MCHC 06/19/2015 33.8  30.0 - 36.0 g/dL Final  . RDW 06/19/2015 17.3* 11.5 - 15.5 % Final  . Platelets 06/19/2015 368  150 - 400 K/uL Final  . Neutrophils  Relative % 06/19/2015 66  43 - 77 % Final  . Neutro Abs 06/19/2015 7.2  1.7 - 7.7 K/uL Final  . Lymphocytes Relative 06/19/2015 18  12 - 46 % Final  . Lymphs Abs 06/19/2015 1.9  0.7 - 4.0 K/uL  Final  . Monocytes Relative 06/19/2015 9  3 - 12 % Final  . Monocytes Absolute 06/19/2015 1.0  0.1 - 1.0 K/uL Final  . Eosinophils Relative 06/19/2015 7* 0 - 5 % Final  . Eosinophils Absolute 06/19/2015 0.8* 0.0 - 0.7 K/uL Final  . Basophils Relative 06/19/2015 0  0 - 1 % Final  . Basophils Absolute 06/19/2015 0.0  0.0 - 0.1 K/uL Final  . Sodium 06/19/2015 134* 135 - 145 mmol/L Final  . Potassium 06/19/2015 3.8  3.5 - 5.1 mmol/L Final  . Chloride 06/19/2015 103  101 - 111 mmol/L Final  . CO2 06/19/2015 23  22 - 32 mmol/L Final  . Glucose, Bld 06/19/2015 104* 65 - 99 mg/dL Final  . BUN 06/19/2015 17  6 - 20 mg/dL Final  . Creatinine, Ser 06/19/2015 0.81  0.44 - 1.00 mg/dL Final  . Calcium 06/19/2015 9.6  8.9 - 10.3 mg/dL Final  . GFR calc non Af Amer 06/19/2015 >60  >60 mL/min Final  . GFR calc Af Amer 06/19/2015 >60  >60 mL/min Final   Comment: (NOTE) The eGFR has been calculated using the CKD EPI equation. This calculation has not been validated in all clinical situations. eGFR's persistently <60 mL/min signify possible Chronic Kidney Disease.   . Anion gap 06/19/2015 8  5 - 15 Final  . Specimen Description 06/19/2015 Urine   Final  . Special Requests 06/19/2015 NONE   Final  . Culture 06/19/2015    Final                   Value:MULTIPLE SPECIES PRESENT, SUGGEST RECOLLECTION Performed at [facility]   . Report Status 06/19/2015 06/21/2015 FINAL   Final  . Color, Urine 06/19/2015 YELLOW  YELLOW Final  . APPearance 06/19/2015 CLEAR  CLEAR Final  . Specific Gravity, Urine 06/19/2015 1.010  1.005 - 1.030 Final  . pH 06/19/2015 6.5  5.0 - 8.0 Final  . Glucose, UA 06/19/2015 NEGATIVE  NEGATIVE mg/dL Final  . Hgb urine dipstick 06/19/2015 NEGATIVE  NEGATIVE Final  . Bilirubin Urine 06/19/2015 NEGATIVE  NEGATIVE Final  . Ketones, ur 06/19/2015 NEGATIVE  NEGATIVE mg/dL Final  . Protein, ur 06/19/2015 NEGATIVE  NEGATIVE mg/dL Final  . Urobilinogen, UA 06/19/2015 0.2  0.0 - 1.0  mg/dL Final  . Nitrite 06/19/2015 NEGATIVE  NEGATIVE Final  . Leukocytes, UA 06/19/2015 NEGATIVE  NEGATIVE Final   MICROSCOPIC NOT DONE ON URINES WITH NEGATIVE PROTEIN, BLOOD, LEUKOCYTES, NITRITE, OR GLUCOSE <1000 mg/dL.  . WBC 06/20/2015 11.9* 4.0 - 10.5 K/uL Final  . RBC 06/20/2015 3.33* 3.87 - 5.11 MIL/uL Final  . Hemoglobin 06/20/2015 10.2* 12.0 - 15.0 g/dL Final  . HCT 06/20/2015 31.0* 36.0 - 46.0 % Final  . MCV 06/20/2015 93.1  78.0 - 100.0 fL Final  . MCH 06/20/2015 30.6  26.0 - 34.0 pg Final  . MCHC 06/20/2015 32.9  30.0 - 36.0 g/dL Final  . RDW 06/20/2015 17.3* 11.5 - 15.5 % Final  . Platelets 06/20/2015 321  150 - 400 K/uL Final  . Sodium 06/20/2015 136  135 - 145 mmol/L Final  . Potassium 06/20/2015 3.7  3.5 - 5.1 mmol/L Final  . Chloride 06/20/2015 103  101 - 111  mmol/L Final  . CO2 06/20/2015 25  22 - 32 mmol/L Final  . Glucose, Bld 06/20/2015 140* 65 - 99 mg/dL Final  . BUN 06/20/2015 14  6 - 20 mg/dL Final  . Creatinine, Ser 06/20/2015 0.92  0.44 - 1.00 mg/dL Final  . Calcium 06/20/2015 9.2  8.9 - 10.3 mg/dL Final  . GFR calc non Af Amer 06/20/2015 56* >60 mL/min Final  . GFR calc Af Amer 06/20/2015 >60  >60 mL/min Final   Comment: (NOTE) The eGFR has been calculated using the CKD EPI equation. This calculation has not been validated in all clinical situations. eGFR's persistently <60 mL/min signify possible Chronic Kidney Disease.   . Anion gap 06/20/2015 8  5 - 15 Final  . Glucose-Capillary 06/20/2015 114* 65 - 99 mg/dL Final  . Glucose-Capillary 06/20/2015 139* 65 - 99 mg/dL Final  . Comment 1 06/20/2015 Notify RN   Final  . Glucose-Capillary 06/20/2015 102* 65 - 99 mg/dL Final  . Glucose-Capillary 06/20/2015 133* 65 - 99 mg/dL Final  . Glucose-Capillary 06/20/2015 191* 65 - 99 mg/dL Final  . WBC 06/21/2015 11.9* 4.0 - 10.5 K/uL Final  . RBC 06/21/2015 3.36* 3.87 - 5.11 MIL/uL Final  . Hemoglobin 06/21/2015 10.2* 12.0 - 15.0 g/dL Final  . HCT 06/21/2015 31.3*  36.0 - 46.0 % Final  . MCV 06/21/2015 93.2  78.0 - 100.0 fL Final  . MCH 06/21/2015 30.4  26.0 - 34.0 pg Final  . MCHC 06/21/2015 32.6  30.0 - 36.0 g/dL Final  . RDW 06/21/2015 17.2* 11.5 - 15.5 % Final  . Platelets 06/21/2015 299  150 - 400 K/uL Final  . Sodium 06/21/2015 134* 135 - 145 mmol/L Final  . Potassium 06/21/2015 3.6  3.5 - 5.1 mmol/L Final  . Chloride 06/21/2015 102  101 - 111 mmol/L Final  . CO2 06/21/2015 24  22 - 32 mmol/L Final  . Glucose, Bld 06/21/2015 130* 65 - 99 mg/dL Final  . BUN 06/21/2015 22* 6 - 20 mg/dL Final  . Creatinine, Ser 06/21/2015 0.87  0.44 - 1.00 mg/dL Final  . Calcium 06/21/2015 9.3  8.9 - 10.3 mg/dL Final  . GFR calc non Af Amer 06/21/2015 60* >60 mL/min Final  . GFR calc Af Amer 06/21/2015 >60  >60 mL/min Final   Comment: (NOTE) The eGFR has been calculated using the CKD EPI equation. This calculation has not been validated in all clinical situations. eGFR's persistently <60 mL/min signify possible Chronic Kidney Disease.   . Anion gap 06/21/2015 8  5 - 15 Final  . Glucose-Capillary 06/20/2015 112* 65 - 99 mg/dL Final  . Comment 1 06/20/2015 Notify RN   Final  . Glucose-Capillary 06/21/2015 119* 65 - 99 mg/dL Final  . Glucose-Capillary 06/21/2015 146* 65 - 99 mg/dL Final    Mr Hip Left W Wo Contrast  06/19/2015   CLINICAL DATA:  Severe left hip pain. Lesion in the proximal left femur.  EXAM: MRI OF THE LEFT HIP WITHOUT AND WITH CONTRAST  TECHNIQUE: Multiplanar, multisequence MR imaging was performed both before and after administration of intravenous contrast.  CONTRAST:  14 cc MultiHance  COMPARISON:  Radiographs dated 06/19/2015 and 01/24/2015 and CT scan dated 06/12/2014  FINDINGS: Muscles and tendons  Muscles and tendons: There is avulsion of the hamstring tendon origins from the left ischial tuberosity with marked edema as well as some fluid surrounding the ischial tuberosity and extending between the left lesser trochanter and the ischium.  There is slight enhancement of  the soft tissues in that area after contrast administration.  There is a 3.4 x 1.8 x 1.8 cm benign appearing probable chondroid lesion in the proximal left femoral shaft just below the lesser trochanter consistent with a benign enchondroma with no pathologic enhancement after contrast administration.  There is moderate arthritis of the left hip joint with diffuse joint space narrowing and multiple subcortical cysts in the acetabulum.  There are no fractures.  There is no bone destruction.  The patient has severe degenerative disc disease in the lumbar spine with severe spinal stenosis at L5-S1. Multiple diverticula in the distal colon.  IMPRESSION: 1. Avulsion of all the hamstring tendons from the left ischial tuberosity with secondary edema and fluid in the adjacent soft tissues including the adjacent musculature. 2. Benign chondroid lesion in the proximal left femur, unchanged since 06/12/2014. 3. Moderate osteoarthritis the left hip. 4. Severe spinal stenosis at L5-S1.   Electronically Signed   By: Lorriane Shire [name]   On: 06/19/2015 21:42   Dg Hip Unilat With Pelvis 2-3 Views Left  06/19/2015   CLINICAL DATA:  Left hip pain.  History of rheumatoid arthritis.  EXAM: DG HIP (WITH OR WITHOUT PELVIS) 2-3V LEFT  COMPARISON:  01/24/2015 and 06/12/2014  FINDINGS: The pelvic bony ring is intact. There is mild joint space narrowing along the medial and superior aspect the left hip joint. No evidence for an acute fracture or dislocation. Again noted is a lucent lesion in the proximal left femur in the subtrochanteric region with some peripheral sclerosis. This lesion roughly measures 4.9 cm in length and similar to the prior examinations.  IMPRESSION: No acute bone abnormality involving the pelvis or left hip.  Mild joint space narrowing in the left hip joint.  There is a stable lucent lesion involving the proximal left femur. Suspect this represents a benign etiology such as an  enchondroma.  If the patient has persistent pain, consider further characterization with MRI to look for an occult injury and further characterization of the lucent bone lesion.   Electronically Signed   By: Markus Daft [name]   On: 06/19/2015 14:34     Assessment/Plan   ICD-9-CM ICD-10-CM   1. Avulsion of hamstring muscle, left, initial encounter - possibly med induced (Humira carries <5% tendon disease ADR) 843.8 S76.392A   2. Rheumatoid arthritis - stable on humira and MTX 714.0 M06.9   3. Type 2 diabetes mellitus with other diabetic kidney complication - stable 790.24 E11.29 Hemoglobin A1c     Lipid Panel     Microalbumin/Creatinine Ratio, Urine  4. Fibromyalgia - stable 729.1 M79.7   5. Essential hypertension - stable 401.9 I10   6. Encounter for therapeutic drug monitoring - stable V58.83 Z51.81   7. Other seasonal allergic rhinitis 0 stable - stable 477.8 J30.2   8. Idiopathic gout, unspecified chronicity, unspecified site - stable 274.9 M10.00    Recommend OTC plain claritin, allegra or zyrtec daily fore seasonal allergy. May use saline nasal spray prn +/- nasal steroid  Continue current medications as ordered  Follow up with Rheumatology to discuss continuation of Humira vs changing to another med  Will call with lab results  Rx written for her to have a 4 wheeled walker with seat and brakes (RE: RA and left hamstring avulsion)  Follow up in 3 mos for routine visit  Eshawn Coor S. Perlie Gold  [facility] [address] [place], [place] [address] [phone]  Cell (Monday-Friday 8 AM - 5 PM) [phone] After 5 PM and follow prompts

## 2015-07-04 LAB — LIPID PANEL
Chol/HDL Ratio: 2.5 ratio units (ref 0.0–4.4)
Cholesterol, Total: 180 mg/dL (ref 100–199)
HDL: 72 mg/dL (ref >39)
LDL Calculated: 95 mg/dL (ref 0–99)
Triglycerides: 65 mg/dL (ref 0–149)
VLDL Cholesterol Cal: 13 mg/dL (ref 5–40)

## 2015-07-04 LAB — HEMOGLOBIN A1C
Est. average glucose Bld gHb Est-mCnc: 146 mg/dL
Hgb A1c MFr Bld: 6.7 % — ABNORMAL HIGH (ref 4.8–5.6)

## 2015-07-05 ENCOUNTER — Other Ambulatory Visit: Payer: Medicare Other

## 2015-07-05 ENCOUNTER — Other Ambulatory Visit: Payer: Self-pay

## 2015-07-05 ENCOUNTER — Telehealth: Payer: Self-pay

## 2015-07-05 MED ORDER — LISINOPRIL 10 MG PO TABS: ORAL_TABLET | ORAL | Status: DC

## 2015-07-05 NOTE — Telephone Encounter (Signed)
I called patient to clarify if she received walker that was requested. Orders were on CMA's desk and it was unclear if they were faxed. Patient states she has not received walker or heard anything else about it.  Patient states order should [facility]ed to [facility]. Patient aware I will fax on Monday and follow-up with [facility]

## 2015-07-06 LAB — MICROALBUMIN / CREATININE URINE RATIO
Creatinine, Urine: 18.4 mg/dL
MICROALB/CREAT RATIO: 206 mg/g creat — ABNORMAL HIGH (ref 0.0–30.0)
Microalbumin, Urine: 37.9 ug/mL

## 2015-07-10 ENCOUNTER — Ambulatory Visit: Payer: Medicare Other | Admitting: Internal Medicine

## 2015-07-12 ENCOUNTER — Telehealth: Payer: Self-pay

## 2015-07-12 NOTE — Telephone Encounter (Signed)
Received fax from [facility] BP 170/96 today. Per Dr. Montez Morita orders called asking what it is now. They only take BP in the morning. Doesn't know what it was yesterday because they only get a call if bp is out of range, but the patient would have it. Called patient left message on voice mail, need to make appt to come in for evaluation of bp, and bring readings with her.

## 2015-07-18 ENCOUNTER — Other Ambulatory Visit: Payer: Self-pay | Admitting: Internal Medicine

## 2015-07-18 NOTE — Telephone Encounter (Signed)
Patient daughter, Para March requested to be faxed to pharmacy

## 2015-07-23 ENCOUNTER — Ambulatory Visit (INDEPENDENT_AMBULATORY_CARE_PROVIDER_SITE_OTHER): Payer: Medicare Other | Admitting: Nurse Practitioner

## 2015-07-23 ENCOUNTER — Encounter: Payer: Self-pay | Admitting: Nurse Practitioner

## 2015-07-23 VITALS — BP 136/88 | HR 94 | Temp 98.3°F | Ht 64.0 in | Wt 183.0 lb

## 2015-07-23 DIAGNOSIS — R5381 Other malaise: Secondary | ICD-10-CM | POA: Diagnosis not present

## 2015-07-23 DIAGNOSIS — Z23 Encounter for immunization: Secondary | ICD-10-CM

## 2015-07-23 DIAGNOSIS — M069 Rheumatoid arthritis, unspecified: Secondary | ICD-10-CM

## 2015-07-23 DIAGNOSIS — R5383 Other fatigue: Secondary | ICD-10-CM | POA: Diagnosis not present

## 2015-07-23 DIAGNOSIS — I10 Essential (primary) hypertension: Secondary | ICD-10-CM | POA: Diagnosis not present

## 2015-07-23 DIAGNOSIS — D649 Anemia, unspecified: Secondary | ICD-10-CM

## 2015-07-23 DIAGNOSIS — M109 Gout, unspecified: Secondary | ICD-10-CM | POA: Diagnosis not present

## 2015-07-23 DIAGNOSIS — G4700 Insomnia, unspecified: Secondary | ICD-10-CM

## 2015-07-23 LAB — POCT URINALYSIS DIPSTICK
Bilirubin, UA: NEGATIVE
Blood, UA: NEGATIVE
Glucose, UA: NEGATIVE
Ketones, UA: NEGATIVE
Leukocytes, UA: NEGATIVE
Nitrite, UA: NEGATIVE
Protein, UA: NEGATIVE
Spec Grav, UA: 1.01
Urobilinogen, UA: 0.2
pH, UA: 6

## 2015-07-23 NOTE — Patient Instructions (Signed)
Will get blood work today and test urine  Try to limit naps during the day Take pain pill before bedtime to help with comfort for rest  Blood pressure looks good- cont medication as prescribed and may use as needed clonidine for sbp over 160

## 2015-07-23 NOTE — Progress Notes (Signed)
Patient ID: [name] [name], female   DOB: 1930-11-08, 81 y.o.   MRN: [id number]    PCP: Kirt Boys, DO  Allergies  Allergen Reactions  . Cetirizine Hcl Other (See Comments)    unknown  . Codeine Other (See Comments)    unknown  . Fish Allergy   . Indomethacin Other (See Comments)    unknown  . Lyrica [Pregabalin]     hallucinations  . Methyldopa Other (See Comments)    unknown  . Shellfish Allergy Hives  . Tomato Rash    Chief Complaint  Patient presents with  . Acute Visit    Patient c/o elevated B/P, 186/110. Patient fell on her way to visit, EMS was called and they did a quick evaluation   . Immunizations    Flu vaccine   . Fatigue    Extreme fatigue x 10 days or more     HPI: Patient is a 81 y.o. female seen in the office today due to blood pressure being elevated. Pt with a pmh of left avulsion hamstring, RA, HTN and DM II. Home care called and reports blood pressure was 170/96. Pt also had a fall today, thought she was up against the chair and went to sit down and she was not. Sat on the floor.Blood pressure at home was 185/110 after fall today.  EMS called and looked over her. No pain noted. Ambulating without difficulty. No pain.  Reports blood pressure is better when home care takes it manually. Blood pressures log - 110-140/60-90  occasional sbp over 160-- has clonidine as needed, has not taken recently, blood pressure has always come down before she needs it.  Daughter here with her today.  Last week was very sleepy, lethargic, no energy.  Can not get warm, cold all the time.  Everything is a taxing effort. Has been going on for the last 2 weeks.  Not sleeping good at night.  Has not been taking humaria for over a month, question this being the cause, no increase in pain Overall feels well, no chest pains, shortness of breath, cough, congestion, N/V/diarrhea or constipation No dysuria   Review of Systems:  Review of Systems  Constitutional: Positive  for chills, activity change and fatigue. Negative for fever, diaphoresis, appetite change and unexpected weight change.  HENT: Negative for congestion.   Respiratory: Negative for cough and shortness of breath.   Cardiovascular: Negative for chest pain, palpitations and leg swelling.  Gastrointestinal: Negative for nausea, vomiting, diarrhea, constipation and abdominal distention.  Endocrine: Positive for cold intolerance. Negative for polydipsia, polyphagia and polyuria.  Genitourinary: Negative for dysuria and difficulty urinating.  Musculoskeletal:       Chronic pain in hands and legs, no changes with this, no pain at time of visit  Skin: Negative.   Allergic/Immunologic: Negative for environmental allergies.  Neurological: Negative for dizziness, speech difficulty, light-headedness and headaches.  Psychiatric/Behavioral: Positive for sleep disturbance. Negative for behavioral problems, confusion and agitation. The patient is not nervous/anxious.     Past Medical History  Diagnosis Date  . Hypertension   . First degree atrioventricular block   . Onychia and paronychia of toe   . Muscle weakness (generalized)   . Allergic rhinitis due to pollen 04/27/2007  . Gout, unspecified 04/19/2013  . Disorder of bone and cartilage, unspecified   . Edema 05/03/2013  . Hyperlipidemia 04/27/2007  . Unspecified essential hypertension 08/08/2013  . Coronary atherosclerosis of native coronary artery   . Other malaise and fatigue   .  Cough   . Other fall   . Unspecified vitamin D deficiency   . TIA (transient ischemic attack)     "a few one summer" (08/07/2013)  . Myocardial infarction ~ 1970  . Exertional shortness of breath     "sometimes" (08/07/2013)  . Type II diabetes mellitus     "fasting 90-110s" (08/07/2013)  . Osteoarthrosis, unspecified whether generalized or localized, unspecified site 04/27/2007  . Prepatellar bursitis   . Arthritis     "in q joint" (08/07/2013)  . Stroke  01/17/2014  . Seizures   . Muscle spasm    Past Surgical History  Procedure Laterality Date  . Tonsillectomy  08/1974  . Replacement total knee Right 09/2005  . Shoulder open rotator cuff repair Right 07/1999  . Appendectomy  1960  . Abdominal hysterectomy  04/1980  . Transthoracic echocardiogram  2003    EF 55-65%; mild concentric LVH  . Joint replacement    . Total knee arthroplasty Left 08/07/2013  . Cataract extraction w/ intraocular lens  implant, bilateral Bilateral ~ 2012  . Total knee arthroplasty Left 08/07/2013    Procedure: TOTAL KNEE ARTHROPLASTY- LEFT;  Surgeon: Dannielle Huh, MD;  Location: [facility] OR;  Service: Orthopedics;  Laterality: Left;  . Cardiac catheterization  2003   Social History:   reports that she has never smoked. She has never used smokeless tobacco. She reports that she does not drink alcohol or use illicit drugs.  Family History  Problem Relation Age of Onset  . Heart attack Father     Medications: [name] Medications  New Prescriptions   No medications on file  Previous Medications   ALLOPURINOL (ZYLOPRIM) 100 MG TABLET    TAKE 1 TABLET BY MOUTH EVERY DAY FOR GOUT   ASPIRIN EC 81 MG TABLET    Take 81 mg by mouth daily.   CALCIUM-VITAMIN D (OSCAL WITH D) 500-200 MG-UNIT PER TABLET    Take 1 tablet by mouth 2 (two) times daily.   CHOLECALCIFEROL (VITAMIN D-3) 1000 UNITS CAPS    Take 1 capsule by mouth daily.   CLONIDINE (CATAPRES) 0.1 MG TABLET    Take 0.1 mg by mouth daily as needed (blood presure is above 160). For SBP>160   FERROUS SULFATE 325 (65 FE) MG TABLET    Take 325 mg by mouth 2 (two) times daily with a meal.   FOLIC ACID (FOLVITE) 1 MG TABLET    Take 2 mg by mouth daily.    GLUCOSE BLOOD (ONE TOUCH ULTRA TEST) TEST STRIP    Check blood sugar once daily DX 250.00   HYDRALAZINE (APRESOLINE) 25 MG TABLET    Take 25 mg by mouth 2 (two) times daily.   LABETALOL (NORMODYNE) 100 MG TABLET    Take 2 tablets (200 mg total) by mouth 2 (two) times  daily.   LISINOPRIL (PRINIVIL,ZESTRIL) 10 MG TABLET    Take 2 tablets by mouth daily for blood pressure   METFORMIN (GLUCOPHAGE) 500 MG TABLET    TAKE 1 TABLET BY MOUTH EVERY DAY FOR DIABETES   METHOTREXATE (RHEUMATREX) 2.5 MG TABLET    Provided by rheumatology- started on 4 tablets every week for 2 weeks, then 6 tab every week for 2 weeks, then 8 tab every week   METHOTREXATE (RHEUMATREX) 2.5 MG TABLET    6 tablets once a week(thursday)   OVER THE COUNTER MEDICATION    Take 1 tablet by mouth 2 (two) times daily. Wellness formula for immune support   OVER THE  COUNTER MEDICATION    Take 5 mLs by mouth every morning. Chloraphil liquid, mix in apply juice   OXYCODONE (OXY-IR) 5 MG CAPSULE    Take 5 mg by mouth every 4 (four) hours as needed for pain (Arthritis pain).   POLYVINYL ALCOHOL (LIQUIFILM TEARS) 1.4 % OPHTHALMIC SOLUTION    Place 1 drop into both eyes as needed (for dry eyes).   PREDNISONE (DELTASONE) 1 MG TABLET    Take 2-4 mg by mouth daily with breakfast. As directed: Directions where to take- 4mg  every morning in July, then 3mg  every morning in august and 2mg  every morning in September.   TRAMADOL (ULTRAM) 50 MG TABLET    take 1-2 tablets by mouth every 6 hours for MODERATE pain   TRIAMCINOLONE CREAM (KENALOG) 0.1 %    APPLY TO AFFECTED AREA TWICE DAILY AS NEEDED FOR IRRITATION   VITAMIN C (ASCORBIC ACID) 500 MG TABLET    Take 500 mg by mouth daily.  Modified Medications   No medications on file  Discontinued Medications   ADALIMUMAB 40 MG/0.8ML PSKT    Inject into the skin. Take 0.8 ml every two weeks   ALBUTEROL (PROVENTIL HFA;VENTOLIN HFA) 108 (90 BASE) MCG/ACT INHALER    Inhale 2 puffs into the lungs every 6 (six) hours as needed for wheezing.   FERROUS SULFATE 325 (65 FE) MG TABLET    Take one tablet by mouth three times daily for anemia   HUMIRA PEN 40 MG/0.8ML PNKT       HYDRALAZINE (APRESOLINE) 25 MG TABLET    take 1 tablet by mouth four times a day   HYDROCODONE-ACETAMINOPHEN  (NORCO/VICODIN) 5-325 MG PER TABLET    Take 1-2 tablets by mouth every 6 (six) hours as needed for moderate pain.     Physical Exam:  Filed Vitals:   07/23/15 1348  BP: 136/88  Pulse: 94  Temp: 98.3 F (36.8 C)  TempSrc: Oral  Height: 5\' 4"  (1.626 m)  Weight: 183 lb (83.008 kg)  SpO2: 97%    Physical Exam  Constitutional: She is oriented to person, place, and time. She appears well-developed and well-nourished. No distress.  Elderly female, looks well, NAD  HENT:  Head: Normocephalic and atraumatic.  Mouth/Throat: Oropharynx is clear and moist. No oropharyngeal exudate.  Eyes: Conjunctivae are normal. Pupils are equal, round, and reactive to light.  Neck: Normal range of motion. Neck supple.  Cardiovascular: Normal rate, regular rhythm and normal heart sounds.   Pulmonary/Chest: Effort normal and breath sounds normal.  Abdominal: Soft. Bowel sounds are normal.  Musculoskeletal: She exhibits no edema or tenderness.  Arthritic changes to hands bilaterally  Neurological: She is alert and oriented to person, place, and time.  Skin: Skin is warm and dry. She is not diaphoretic.  Psychiatric: She has a normal mood and affect.    Labs reviewed: Basic Metabolic Panel:  Recent Labs  91/91/66 1440 06/20/15 0440 06/21/15 0428  NA 134* 136 134*  K 3.8 3.7 3.6  CL 103 103 102  CO2 23 25 24   GLUCOSE 104* 140* 130*  BUN 17 14 22*  CREATININE 0.81 0.92 0.87  CALCIUM 9.6 9.2 9.3   Liver Function Tests:  Recent Labs  09/11/14 03/27/15 1156  AST 11* 18  ALT 8 12*  ALKPHOS 57 54  BILITOT  --  0.7  PROT  --  7.2  ALBUMIN  --  3.6   No results for input(s): LIPASE, AMYLASE in the last 8760 hours. No results  for input(s): AMMONIA in the last 8760 hours. CBC:  Recent Labs  11/21/14 1753 03/27/15 1156 06/19/15 1440 06/20/15 0440 06/21/15 0428  WBC 8.4 11.0* 10.9* 11.9* 11.9*  NEUTROABS 6.3 8.8* 7.2  --   --   HGB 10.0* 10.5* 10.8* 10.2* 10.2*  HCT 30.9* 31.9*  32.0* 31.0* 31.3*  MCV 90.4 92.5 94.1 93.1 93.2  PLT 413* 301 368 321 299   Lipid Panel:  Recent Labs  07/03/15 1450  CHOL 180  HDL 72  LDLCALC 95  TRIG 65  CHOLHDL 2.5   TSH: No results for input(s): TSH in the last 8760 hours. A1C: Lab Results  Component Value Date   HGBA1C 6.7* 07/03/2015     Assessment/Plan 1. Encounter for immunization -flu vaccine administered today  2. Malaise and fatigue -worsening fatigue and malaise over the last 2 weeks plus. Blood sugars well controlled, no weight loss, good appetite. Does have trouble with sleep which may contribute.  Will follow up labs for electrolyte abnomalities, worsening anemia, thyroid dysfunction and UA.   - CBC with Differential - Comprehensive metabolic panel - TSH  3. Essential hypertension Blood pressures reviewed and overall stable, to cont current medications, discussed [place] of PRN for sbp over 160.    4. Gout without tophus, unspecified cause, unspecified chronicity, unspecified site -no acute gout noted, conts on allopurinol 100 mg daily - follow up Uric acid  5. Anemia, unspecified anemia type -recent hgb stable, will follow up cbc -conts on iron twice daily with meals -conts on folic acid  6. Rheumatoid arthritis - conts on methotrexate   7. Insomnia -worsening sleep due to stiffness in joints, to take pain pill prior to bed to help with this -to limit naps during the day   Gennifer Potenza K. Biagio Borg  [facility] [phone] 8 am - 5 pm) [phone] (after hours)

## 2015-07-24 LAB — COMPREHENSIVE METABOLIC PANEL
ALT: 7 IU/L (ref 0–32)
AST: 16 IU/L (ref 0–40)
Albumin/Globulin Ratio: 1.2 (ref 1.1–2.5)
Albumin: 3.9 g/dL (ref 3.5–4.7)
Alkaline Phosphatase: 65 IU/L (ref 39–117)
BUN/Creatinine Ratio: 22 (ref 11–26)
BUN: 20 mg/dL (ref 8–27)
Bilirubin Total: 0.3 mg/dL (ref 0.0–1.2)
CO2: 20 mmol/L (ref 18–29)
Calcium: 9.8 mg/dL (ref 8.7–10.3)
Chloride: 101 mmol/L (ref 97–108)
Creatinine, Ser: 0.91 mg/dL (ref 0.57–1.00)
GFR calc Af Amer: 67 mL/min/{1.73_m2} (ref >59)
GFR calc non Af Amer: 59 mL/min/{1.73_m2} — ABNORMAL LOW (ref >59)
Globulin, Total: 3.3 g/dL (ref 1.5–4.5)
Glucose: 105 mg/dL — ABNORMAL HIGH (ref 65–99)
Potassium: 4.9 mmol/L (ref 3.5–5.2)
Sodium: 137 mmol/L (ref 134–144)
Total Protein: 7.2 g/dL (ref 6.0–8.5)

## 2015-07-24 LAB — CBC WITH DIFFERENTIAL/PLATELET
Basophils Absolute: 0.1 10*3/uL (ref 0.0–0.2)
Basos: 1 %
EOS (ABSOLUTE): 0.6 10*3/uL — ABNORMAL HIGH (ref 0.0–0.4)
Eos: 6 %
Hematocrit: 30.3 % — ABNORMAL LOW (ref 34.0–46.6)
Hemoglobin: 10 g/dL — ABNORMAL LOW (ref 11.1–15.9)
Immature Grans (Abs): 0 10*3/uL (ref 0.0–0.1)
Immature Granulocytes: 0 %
Lymphocytes Absolute: 2.1 10*3/uL (ref 0.7–3.1)
Lymphs: 24 %
MCH: 30.5 pg (ref 26.6–33.0)
MCHC: 33 g/dL (ref 31.5–35.7)
MCV: 92 fL (ref 79–97)
Monocytes Absolute: 0.6 10*3/uL (ref 0.1–0.9)
Monocytes: 7 %
Neutrophils Absolute: 5.4 10*3/uL (ref 1.4–7.0)
Neutrophils: 62 %
Platelets: 365 10*3/uL (ref 150–379)
RBC: 3.28 x10E6/uL — ABNORMAL LOW (ref 3.77–5.28)
RDW: 17.9 % — ABNORMAL HIGH (ref 12.3–15.4)
WBC: 8.7 10*3/uL (ref 3.4–10.8)

## 2015-07-24 LAB — TSH: TSH: 1.42 u[IU]/mL (ref 0.450–4.500)

## 2015-07-24 LAB — URIC ACID: Uric Acid: 6.8 mg/dL (ref 2.5–7.1)

## 2015-07-26 ENCOUNTER — Encounter: Payer: Self-pay | Admitting: Podiatry

## 2015-07-26 ENCOUNTER — Ambulatory Visit (INDEPENDENT_AMBULATORY_CARE_PROVIDER_SITE_OTHER): Payer: Medicare Other | Admitting: Podiatry

## 2015-07-26 VITALS — Ht 64.0 in | Wt 184.0 lb

## 2015-07-26 DIAGNOSIS — B351 Tinea unguium: Secondary | ICD-10-CM | POA: Diagnosis not present

## 2015-07-26 DIAGNOSIS — M79606 Pain in leg, unspecified: Secondary | ICD-10-CM | POA: Diagnosis not present

## 2015-07-26 NOTE — Progress Notes (Signed)
SUBJECTIVE: 81 y.o. year old female accompanied by her daughter presents requesting toe nails trimmed. Contracted middle toes are very painful No new complaints other than existing arthritis.Marland Kitchen  REVIEW OF SYSTEMS: A comprehensive review of systems was negative.  OBJECTIVE: DERMATOLOGIC EXAMINATION: Nails: Hypertrophic nails x 10. VASCULAR EXAMINATION OF LOWER LIMBS: Pedal pulses: All pedal pulses are palpable with normal pulsation.  No edema or erythema noted.  NEUROLOGIC EXAMINATION OF THE LOWER LIMBS: All epicritic and tactile sensations grossly intact.  MUSCULOSKELETAL EXAMINATION: Severe contracted 3rd digits bilateral painful.  Assessment: Onychomycosis x 10.  Hammer toe deformity 3rd bilateral painful in shoes.  PLAN: All nails debrided. Should benefit from diabetic shoes.

## 2015-07-26 NOTE — Patient Instructions (Signed)
Seen for hypertrophic nails. All nails debrided. May benefit from diabetic shoes for painful hammer toe 3rd bilateral.  Return in 3 months or as needed.

## 2015-07-30 ENCOUNTER — Other Ambulatory Visit: Payer: Self-pay | Admitting: *Deleted

## 2015-07-30 MED ORDER — OXYCODONE HCL 5 MG PO CAPS: 5.0000 mg | ORAL_CAPSULE | ORAL | Status: DC | PRN

## 2015-07-30 NOTE — Telephone Encounter (Signed)
Patient daughter requested and will pick up 

## 2015-07-31 ENCOUNTER — Telehealth: Payer: Self-pay | Admitting: *Deleted

## 2015-07-31 NOTE — Telephone Encounter (Signed)
Received Form for Diabetic Shoes and Inserts from Dr. Koren Shiver [phone]) #:[phone]. Patient dropped off to have filled out and signed. Given to Dr. Montez Morita to review and sign.

## 2015-08-01 NOTE — Telephone Encounter (Signed)
Faxed form to Fax: [phone].  Daughter, Para March notified and will pick up original

## 2015-08-09 LAB — BASIC METABOLIC PANEL
BUN: 15 mg/dL (ref 4–21)
Creatinine: 0.8 mg/dL (ref <=1.1)
Glucose: 97 mg/dL
Potassium: 4.3 mmol/L (ref 3.4–5.3)
Sodium: 137 mmol/L (ref 137–147)

## 2015-08-09 LAB — CBC AND DIFFERENTIAL
HCT: 29 % — AB (ref 36–46)
Hemoglobin: 9.8 g/dL — AB (ref 12.0–16.0)
Platelets: 350 10*3/uL (ref 150–399)
WBC: 7.8 10^3/mL

## 2015-08-09 LAB — HEPATIC FUNCTION PANEL
ALT: 7 U/L (ref 7–35)
AST: 15 U/L (ref 13–35)
Alkaline Phosphatase: 65 U/L (ref 25–125)
Bilirubin, Total: 0.4 mg/dL

## 2015-08-12 ENCOUNTER — Telehealth: Payer: Self-pay | Admitting: *Deleted

## 2015-08-12 NOTE — Telephone Encounter (Signed)
Received fax from [facility] regarding patient's blood pressure Readings. At 10:13am - 172/100 and at 10:26am - 154/92 Given to Dr. Renato Gails to review and sign. To be faxed back to Fax#: [phone]

## 2015-08-15 ENCOUNTER — Encounter: Payer: Self-pay | Admitting: *Deleted

## 2015-09-02 ENCOUNTER — Telehealth: Payer: Self-pay | Admitting: *Deleted

## 2015-09-02 MED ORDER — BENZONATATE 100 MG PO CAPS: 200.0000 mg | ORAL_CAPSULE | Freq: Three times a day (TID) | ORAL | Status: DC

## 2015-09-02 NOTE — Telephone Encounter (Signed)
Patient daughter, Elease Hashimoto called and stated that patient has some congestion and cough. [name] cough keeps her up at night. OTC cough mediation is not working. Would like to know what to take for it. Please Advise.

## 2015-09-02 NOTE — Telephone Encounter (Signed)
Patient daughter notified and agreed.  

## 2015-09-02 NOTE — Telephone Encounter (Signed)
She may try tessalon perles 200mg  #30 take 1 po TID prn cough with no RF. If no better, she will need to make an appt to be seen. Cont OTC plain antihistamine daily and saline nasal spray as needed

## 2015-09-09 ENCOUNTER — Ambulatory Visit (INDEPENDENT_AMBULATORY_CARE_PROVIDER_SITE_OTHER): Payer: Medicare Other | Admitting: Internal Medicine

## 2015-09-09 ENCOUNTER — Other Ambulatory Visit: Payer: Self-pay | Admitting: Internal Medicine

## 2015-09-09 ENCOUNTER — Encounter: Payer: Self-pay | Admitting: *Deleted

## 2015-09-09 ENCOUNTER — Encounter: Payer: Self-pay | Admitting: Internal Medicine

## 2015-09-09 VITALS — BP 168/90 | Temp 98.2°F | Resp 20 | Ht 64.0 in | Wt 182.2 lb

## 2015-09-09 DIAGNOSIS — R531 Weakness: Secondary | ICD-10-CM

## 2015-09-09 DIAGNOSIS — K625 Hemorrhage of anus and rectum: Secondary | ICD-10-CM | POA: Diagnosis not present

## 2015-09-09 DIAGNOSIS — Z8719 Personal history of other diseases of the digestive system: Secondary | ICD-10-CM

## 2015-09-09 LAB — COMPREHENSIVE METABOLIC PANEL
ALT: 11 IU/L (ref 0–32)
AST: 17 IU/L (ref 0–40)
Albumin/Globulin Ratio: 1.2 (ref 1.1–2.5)
Albumin: 3.9 g/dL (ref 3.5–4.7)
Alkaline Phosphatase: 71 IU/L (ref 39–117)
BUN/Creatinine Ratio: 19 (ref 11–26)
BUN: 14 mg/dL (ref 8–27)
Bilirubin Total: 0.5 mg/dL (ref 0.0–1.2)
CO2: 24 mmol/L (ref 18–29)
Calcium: 9.4 mg/dL (ref 8.7–10.3)
Chloride: 102 mmol/L (ref 97–106)
Creatinine, Ser: 0.73 mg/dL (ref 0.57–1.00)
GFR calc Af Amer: 88 mL/min/{1.73_m2} (ref >59)
GFR calc non Af Amer: 76 mL/min/{1.73_m2} (ref >59)
Globulin, Total: 3.3 g/dL (ref 1.5–4.5)
Glucose: 108 mg/dL — ABNORMAL HIGH (ref 65–99)
Potassium: 4.4 mmol/L (ref 3.5–5.2)
Sodium: 134 mmol/L — ABNORMAL LOW (ref 136–144)
Total Protein: 7.2 g/dL (ref 6.0–8.5)

## 2015-09-09 LAB — HEPATIC FUNCTION PANEL
ALT: 11 U/L (ref 7–35)
AST: 17 U/L (ref 13–35)
Alkaline Phosphatase: 71 U/L (ref 25–125)
Bilirubin, Total: 0.5 mg/dL

## 2015-09-09 LAB — CBC WITH DIFFERENTIAL/PLATELET
Basophils Absolute: 0 10*3/uL (ref 0.0–0.2)
Basos: 0 %
EOS (ABSOLUTE): 0.5 10*3/uL — ABNORMAL HIGH (ref 0.0–0.4)
Eos: 7 %
Hematocrit: 28 % — ABNORMAL LOW (ref 34.0–46.6)
Hemoglobin: 10 g/dL — ABNORMAL LOW (ref 11.1–15.9)
Lymphocytes Absolute: 1.9 10*3/uL (ref 0.7–3.1)
Lymphs: 25 %
MCH: 30.9 pg (ref 26.6–33.0)
MCHC: 35.7 g/dL (ref 31.5–35.7)
MCV: 86 fL (ref 79–97)
Monocytes Absolute: 0.5 10*3/uL (ref 0.1–0.9)
Monocytes: 6 %
Neutrophils Absolute: 4.6 10*3/uL (ref 1.4–7.0)
Neutrophils: 62 %
Platelets: 340 10*3/uL (ref 150–379)
RBC: 3.24 x10E6/uL — ABNORMAL LOW (ref 3.77–5.28)
RDW: 16.8 % — ABNORMAL HIGH (ref 12.3–15.4)
WBC: 7.6 10*3/uL (ref 3.4–10.8)

## 2015-09-09 LAB — BASIC METABOLIC PANEL
BUN: 14 mg/dL (ref 4–21)
Creatinine: 0.7 mg/dL (ref 0.5–1.1)
Glucose: 108 mg/dL
Potassium: 4.4 mmol/L (ref 3.4–5.3)
Sodium: 134 mmol/L — AB (ref 137–147)

## 2015-09-09 LAB — CBC AND DIFFERENTIAL
HCT: 28 % — AB (ref 36–46)
Hemoglobin: 10 g/dL — AB (ref 12.0–16.0)
Neutrophils Absolute: 5 /uL
Platelets: 340 10*3/uL (ref 150–399)
WBC: 7.6 10^3/mL

## 2015-09-09 NOTE — Progress Notes (Signed)
Patient ID: [name] [name], female   DOB: 1930-04-25, 81 y.o.   MRN: [id number]   Location: [facility] Provider: Gwenith Spitz. Renato Gails, D.O., C.[name]  Goals of Care: Advanced Directive information Does patient have an advance directive?: Yes  Chief Complaint  Patient presents with  . Acute Visit    Patient c/o has blood in stool, and weak     HPI: Patient is a 81 y.o. female seen in the office today for an acute visit due to blood in stool and weakness.  Having chills. Went to go to the bathroom at 4am.  Had blood in stool.  Would pass in stool.  Would have blood on the toilet paper each time she's pass gas.  Sat there for 30 mins.  Continuing with some light bleeding out the rectum.  Went one more time before leaving and no blood that time.  Never had this before.  No abdominal pain.  HR 103.    New aide came late today so she didn't get help showering or with breakfast.  She was frustrated and anxious.  BP elevated.  Bottoms of feet sore--soaked in warm salt water last night which helped.  Cscope in past with diverticulosis, but had no polyps.   Review of Systems:  Review of Systems  Constitutional: Positive for chills and malaise/fatigue. Negative for fever and weight loss.  Respiratory: Negative for shortness of breath.   Cardiovascular: Negative for chest pain and palpitations.  Gastrointestinal: Positive for diarrhea and blood in stool. Negative for abdominal pain, constipation and melena.  Genitourinary: Negative for dysuria.  Musculoskeletal: Negative for falls.  Neurological: Positive for weakness.    Past Medical History  Diagnosis Date  . Hypertension   . First degree atrioventricular block   . Onychia and paronychia of toe   . Muscle weakness (generalized)   . Allergic rhinitis due to pollen 04/27/2007  . Gout, unspecified 04/19/2013  . Disorder of bone and cartilage, unspecified   . Edema 05/03/2013  . Hyperlipidemia 04/27/2007  . Unspecified essential  hypertension 08/08/2013  . Coronary atherosclerosis of native coronary artery   . Other malaise and fatigue   . Cough   . Other fall   . Unspecified vitamin D deficiency   . TIA (transient ischemic attack)     "a few one summer" (08/07/2013)  . Myocardial infarction (HCC) ~ 1970  . Exertional shortness of breath     "sometimes" (08/07/2013)  . Type II diabetes mellitus (HCC)     "fasting 90-110s" (08/07/2013)  . Osteoarthrosis, unspecified whether generalized or localized, unspecified site 04/27/2007  . Prepatellar bursitis   . Arthritis     "in q joint" (08/07/2013)  . Stroke (HCC) 01/17/2014  . Seizures (HCC)   . Muscle spasm     Past Surgical History  Procedure Laterality Date  . Tonsillectomy  08/1974  . Replacement total knee Right 09/2005  . Shoulder open rotator cuff repair Right 07/1999  . Appendectomy  1960  . Abdominal hysterectomy  04/1980  . Transthoracic echocardiogram  2003    EF 55-65%; mild concentric LVH  . Joint replacement    . Total knee arthroplasty Left 08/07/2013  . Cataract extraction w/ intraocular lens  implant, bilateral Bilateral ~ 2012  . Total knee arthroplasty Left 08/07/2013    Procedure: TOTAL KNEE ARTHROPLASTY- LEFT;  Surgeon: Dannielle Huh, MD;  Location: [facility] OR;  Service: Orthopedics;  Laterality: Left;  . Cardiac catheterization  2003    Allergies  Allergen Reactions  .  Cetirizine Hcl Other (See Comments)    unknown  . Codeine Other (See Comments)    unknown  . Fish Allergy   . Indomethacin Other (See Comments)    unknown  . Lyrica [Pregabalin]     hallucinations  . Methyldopa Other (See Comments)    unknown  . Shellfish Allergy Hives  . Tomato Rash      Medication List       This list is accurate as of: 09/09/15 11:48 AM.  Always use your most recent med list.               allopurinol 100 MG tablet  Commonly known as:  ZYLOPRIM  TAKE 1 TABLET BY MOUTH EVERY DAY FOR GOUT     aspirin EC 81 MG tablet  Take 81 mg by mouth  daily.     benzonatate 100 MG capsule  Commonly known as:  TESSALON PERLES  Take 2 capsules (200 mg total) by mouth 3 (three) times daily. As needed for cough     calcium-vitamin D 500-200 MG-UNIT tablet  Commonly known as:  OSCAL WITH D  Take 1 tablet by mouth 2 (two) times daily.     cloNIDine 0.1 MG tablet  Commonly known as:  CATAPRES  Take 0.1 mg by mouth daily as needed (blood presure is above 160). For SBP>160     ferrous sulfate 325 (65 FE) MG tablet  Take 325 mg by mouth 2 (two) times daily with a meal.     folic acid 1 MG tablet  Commonly known as:  FOLVITE  Take 2 mg by mouth daily.     glucose blood test strip  Commonly known as:  ONE TOUCH ULTRA TEST  Check blood sugar once daily DX 250.00     hydrALAZINE 25 MG tablet  Commonly known as:  APRESOLINE  Take 25 mg by mouth 2 (two) times daily.     labetalol 100 MG tablet  Commonly known as:  NORMODYNE  Take 2 tablets (200 mg total) by mouth 2 (two) times daily.     lisinopril 10 MG tablet  Commonly known as:  PRINIVIL,ZESTRIL  Take 2 tablets by mouth daily for blood pressure     metFORMIN 500 MG tablet  Commonly known as:  GLUCOPHAGE  TAKE 1 TABLET BY MOUTH EVERY DAY FOR DIABETES     methotrexate 2.5 MG tablet  Commonly known as:  RHEUMATREX  Provided by rheumatology- started on 4 tablets every week for 2 weeks, then 6 tab every week for 2 weeks, then 8 tab every week     methotrexate 2.5 MG tablet  Commonly known as:  RHEUMATREX  6 tablets once a week(thursday)     OVER THE COUNTER MEDICATION  Take 1 tablet by mouth 2 (two) times daily. Wellness formula for immune support     OVER THE COUNTER MEDICATION  Take 5 mLs by mouth every morning. Chloraphil liquid, mix in apply juice     oxycodone 5 MG capsule  Commonly known as:  OXY-IR  Take 1 capsule (5 mg total) by mouth every 4 (four) hours as needed for pain (Arthritis pain).     polyvinyl alcohol 1.4 % ophthalmic solution  Commonly known as:   LIQUIFILM TEARS  Place 1 drop into both eyes as needed (for dry eyes).     predniSONE 1 MG tablet  Commonly known as:  DELTASONE  Take 2-4 mg by mouth daily with breakfast. As directed: Directions where to take- 4mg   every morning in July, then 3mg  every morning in august and 2mg  every morning in September.     traMADol 50 MG tablet  Commonly known as:  ULTRAM  take 1-2 tablets by mouth every 6 hours for MODERATE pain     triamcinolone cream 0.1 %  Commonly known as:  KENALOG  APPLY TO AFFECTED AREA TWICE DAILY AS NEEDED FOR IRRITATION     vitamin C 500 MG tablet  Commonly known as:  ASCORBIC ACID  Take 500 mg by mouth daily.     Vitamin D-3 1000 UNITS Caps  Take 1 capsule by mouth daily.        Health Maintenance  Topic Date Due  . PNA vac Low Risk Adult (2 of 2 - PCV13) 11/09/2010  . TETANUS/TDAP  04/14/2015  . FOOT EXAM  06/06/2015  . OPHTHALMOLOGY EXAM  09/12/2015  . HEMOGLOBIN A1C  01/03/2016  . INFLUENZA VACCINE  06/09/2016  . URINE MICROALBUMIN  07/04/2016  . DEXA SCAN  Completed  . ZOSTAVAX  Completed    Physical Exam: Filed Vitals:   09/09/15 1124  BP: 168/90  Temp: 98.2 F (36.8 C)  TempSrc: Oral  Resp: 20  Height: 5\' 4"  (1.626 m)  Weight: 182 lb 3.2 oz (82.645 kg)  SpO2: 97%   Body mass index is 31.26 kg/(m^2). Physical Exam  Constitutional: She is oriented to person, place, and time. She appears well-developed and well-nourished.  Very weak and cold  Cardiovascular: Normal rate and regular rhythm.   Pulmonary/Chest: Effort normal and breath sounds normal.  Abdominal: Soft. Bowel sounds are normal. She exhibits no distension and no mass. There is tenderness.  Genitourinary: Guaiac negative stool.  But rectal vault empty  Neurological: She is alert and oriented to person, place, and time.    Labs reviewed: Basic Metabolic Panel:  Recent Labs  07/06/2016 0440 06/21/15 0428 07/23/15 1438 08/09/15  NA 136 134* 137 137  K 3.7 3.6 4.9 4.3  CL  103 102 101  --   CO2 25 24 20   --   GLUCOSE 140* 130* 105*  --   BUN 14 22* 20 15  CREATININE 0.92 0.87 0.91 0.8  CALCIUM 9.2 9.3 9.8  --   TSH  --   --  1.420  --    Liver Function Tests:  Recent Labs  03/27/15 1156 07/23/15 1438 08/09/15  AST 18 16 15   ALT 12* 7 7  ALKPHOS 54 65 65  BILITOT 0.7 0.3  --   PROT 7.2 7.2  --   ALBUMIN 3.6 3.9  --    No results for input(s): LIPASE, AMYLASE in the last 8760 hours. No results for input(s): AMMONIA in the last 8760 hours. CBC:  Recent Labs  03/27/15 1156 06/19/15 1440 06/20/15 0440 06/21/15 0428 07/23/15 1438 08/09/15  WBC 11.0* 10.9* 11.9* 11.9* 8.7 7.8  NEUTROABS 8.8* 7.2  --   --  5.4  --   HGB 10.5* 10.8* 10.2* 10.2*  --  9.8*  HCT 31.9* 32.0* 31.0* 31.3* 30.3* 29*  MCV 92.5 94.1 93.1 93.2  --   --   PLT 301 368 321 299  --  350   Lipid Panel:  Recent Labs  07/03/15 1450  CHOL 180  HDL 72  LDLCALC 95  TRIG 65  CHOLHDL 2.5   Lab Results  Component Value Date   HGBA1C 6.7* 07/03/2015    Assessment/Plan 1. Rectal bleeding - rectal exam negative due to empty rectal vault, small  amt of tenderness in lower abdomen suggestive of diverticular or ischemic cause -check labs for stability--notably hypertensive and mildly tachy (improved by time of exam) - CBC with Differential/Platelet - Comprehensive metabolic panel - if labs stable, monitor for recurrent bleeding--if recurs, go to ED  2. History of diverticulosis -suspected cause of bleeding, but could also be ischemic -advised as above - CBC with Differential/Platelet - Comprehensive metabolic panel  3. Weakness -likely due to multiple blood bms this am, if not improving or labs acutely worse, will send to ED - CBC with Differential/Platelet - Comprehensive metabolic panel  Labs/tests ordered:   Orders Placed This Encounter  Procedures  . CBC with Differential/Platelet  . Comprehensive metabolic panel    Order Specific Question:  Has the patient  fasted?    Answer:  No    Next appt:  Keep appts with Dr. Arrie Eastern L. Adar Rase, D.O. Geriatrics [facility] [address] [place], [place] [address] Cell Phone (Mon-Fri 8am-5pm):  [phone] On Call:  [phone] & follow prompts after 5pm & weekends Office Phone:  [phone] Office Fax:  [phone]

## 2015-09-10 ENCOUNTER — Emergency Department (HOSPITAL_COMMUNITY): Payer: Medicare Other

## 2015-09-10 ENCOUNTER — Encounter (HOSPITAL_COMMUNITY): Payer: Self-pay | Admitting: Emergency Medicine

## 2015-09-10 ENCOUNTER — Observation Stay (HOSPITAL_COMMUNITY)
Admission: EM | Admit: 2015-09-10 | Discharge: 2015-09-12 | Disposition: A | Discharge status: Home / Self Care | Payer: Medicare Other | Attending: Family Medicine | Admitting: Family Medicine

## 2015-09-10 DIAGNOSIS — R059 Cough, unspecified: Secondary | ICD-10-CM

## 2015-09-10 DIAGNOSIS — E119 Type 2 diabetes mellitus without complications: Secondary | ICD-10-CM | POA: Insufficient documentation

## 2015-09-10 DIAGNOSIS — I152 Hypertension secondary to endocrine disorders: Secondary | ICD-10-CM | POA: Diagnosis present

## 2015-09-10 DIAGNOSIS — Z91018 Allergy to other foods: Secondary | ICD-10-CM | POA: Insufficient documentation

## 2015-09-10 DIAGNOSIS — I252 Old myocardial infarction: Secondary | ICD-10-CM | POA: Diagnosis not present

## 2015-09-10 DIAGNOSIS — J811 Chronic pulmonary edema: Secondary | ICD-10-CM | POA: Diagnosis not present

## 2015-09-10 DIAGNOSIS — E1129 Type 2 diabetes mellitus with other diabetic kidney complication: Secondary | ICD-10-CM | POA: Diagnosis present

## 2015-09-10 DIAGNOSIS — K449 Diaphragmatic hernia without obstruction or gangrene: Secondary | ICD-10-CM | POA: Insufficient documentation

## 2015-09-10 DIAGNOSIS — Z885 Allergy status to narcotic agent status: Secondary | ICD-10-CM | POA: Insufficient documentation

## 2015-09-10 DIAGNOSIS — Z7984 Long term (current) use of oral hypoglycemic drugs: Secondary | ICD-10-CM | POA: Insufficient documentation

## 2015-09-10 DIAGNOSIS — M069 Rheumatoid arthritis, unspecified: Secondary | ICD-10-CM | POA: Diagnosis not present

## 2015-09-10 DIAGNOSIS — E785 Hyperlipidemia, unspecified: Secondary | ICD-10-CM | POA: Diagnosis not present

## 2015-09-10 DIAGNOSIS — Z7982 Long term (current) use of aspirin: Secondary | ICD-10-CM | POA: Insufficient documentation

## 2015-09-10 DIAGNOSIS — E1159 Type 2 diabetes mellitus with other circulatory complications: Secondary | ICD-10-CM | POA: Diagnosis present

## 2015-09-10 DIAGNOSIS — E559 Vitamin D deficiency, unspecified: Secondary | ICD-10-CM | POA: Insufficient documentation

## 2015-09-10 DIAGNOSIS — Z888 Allergy status to other drugs, medicaments and biological substances status: Secondary | ICD-10-CM | POA: Insufficient documentation

## 2015-09-10 DIAGNOSIS — N281 Cyst of kidney, acquired: Secondary | ICD-10-CM | POA: Diagnosis not present

## 2015-09-10 DIAGNOSIS — R079 Chest pain, unspecified: Secondary | ICD-10-CM | POA: Diagnosis not present

## 2015-09-10 DIAGNOSIS — R05 Cough: Secondary | ICD-10-CM | POA: Diagnosis not present

## 2015-09-10 DIAGNOSIS — M1990 Unspecified osteoarthritis, unspecified site: Secondary | ICD-10-CM | POA: Diagnosis not present

## 2015-09-10 DIAGNOSIS — Z23 Encounter for immunization: Secondary | ICD-10-CM | POA: Insufficient documentation

## 2015-09-10 DIAGNOSIS — M5136 Other intervertebral disc degeneration, lumbar region: Secondary | ICD-10-CM | POA: Diagnosis not present

## 2015-09-10 DIAGNOSIS — I110 Hypertensive heart disease with heart failure: Secondary | ICD-10-CM | POA: Insufficient documentation

## 2015-09-10 DIAGNOSIS — Z8673 Personal history of transient ischemic attack (TIA), and cerebral infarction without residual deficits: Secondary | ICD-10-CM | POA: Insufficient documentation

## 2015-09-10 DIAGNOSIS — M5134 Other intervertebral disc degeneration, thoracic region: Secondary | ICD-10-CM | POA: Diagnosis not present

## 2015-09-10 DIAGNOSIS — I2510 Atherosclerotic heart disease of native coronary artery without angina pectoris: Secondary | ICD-10-CM | POA: Diagnosis not present

## 2015-09-10 DIAGNOSIS — R918 Other nonspecific abnormal finding of lung field: Secondary | ICD-10-CM | POA: Diagnosis not present

## 2015-09-10 DIAGNOSIS — M109 Gout, unspecified: Secondary | ICD-10-CM | POA: Insufficient documentation

## 2015-09-10 DIAGNOSIS — I5042 Chronic combined systolic (congestive) and diastolic (congestive) heart failure: Secondary | ICD-10-CM

## 2015-09-10 DIAGNOSIS — I5033 Acute on chronic diastolic (congestive) heart failure: Secondary | ICD-10-CM | POA: Insufficient documentation

## 2015-09-10 DIAGNOSIS — Z91013 Allergy to seafood: Secondary | ICD-10-CM | POA: Diagnosis not present

## 2015-09-10 DIAGNOSIS — I10 Essential (primary) hypertension: Secondary | ICD-10-CM

## 2015-09-10 DIAGNOSIS — E1121 Type 2 diabetes mellitus with diabetic nephropathy: Secondary | ICD-10-CM | POA: Diagnosis present

## 2015-09-10 MED ORDER — ASPIRIN 81 MG PO CHEW
324.0000 mg | CHEWABLE_TABLET | Freq: Once | ORAL | Status: AC
  Administered 2015-09-11: 324 mg via ORAL
  Filled 2015-09-10: qty 4

## 2015-09-10 MED ORDER — NITROGLYCERIN 0.4 MG SL SUBL
0.4000 mg | SUBLINGUAL_TABLET | SUBLINGUAL | Status: DC | PRN
  Administered 2015-09-11: 0.4 mg via SUBLINGUAL
  Filled 2015-09-10: qty 1

## 2015-09-10 MED ORDER — ACETAMINOPHEN 500 MG PO TABS
1000.0000 mg | ORAL_TABLET | Freq: Once | ORAL | Status: AC
  Administered 2015-09-11: 1000 mg via ORAL
  Filled 2015-09-10: qty 2

## 2015-09-10 NOTE — ED Provider Notes (Signed)
CSN: [id number]     Arrival date & time 09/10/15  2151 History  By signing my name below, I, Tanda Rockers, attest that this documentation has been prepared under the direction and in the presence of Melene Plan, DO. Electronically Signed: Tanda Rockers, ED Scribe. 09/10/2015. 11:39 PM.  Chief Complaint  Patient presents with  . Chest Pain   The history is provided by the patient. No language interpreter was used.   HPI Comments: VICKI CHAFFIN is a 81 y.o. female with hx CAD, MI, HTN, HLD, DM, and TIA who presents to the Emergency Department complaining of gradual onset, constant, chest "weakness" x 1 day. Pt states that when she takes a breath she has mid chest pain that does not radiate. She cannot say if there are modifying factors to her symptoms because she has been resting all day. Pt does mention that for the past week she had has generalized weakness, bilateral feet swelling, cough, and congestion. Pt was seen by her PCP and given cough medicine with relief from the cough. Pt was not placed on any antibiotics at that time. She also reports  RLQ abdominal tenderness that she was evaluated for by her PCP. Denies nausea, vomiting, diaphoresis, dysuria, urinary frequency, diarrhea, leg swelling, or any other associated symptoms. No recent surgery, estrogen intake, recent prolonged travel, hx of DVT/PE, or hx of cancer. PSHx appendectomy.    Past Medical History  Diagnosis Date  . Hypertension   . First degree atrioventricular block   . Onychia and paronychia of toe   . Muscle weakness (generalized)   . Allergic rhinitis due to pollen 04/27/2007  . Gout, unspecified 04/19/2013  . Disorder of bone and cartilage, unspecified   . Edema 05/03/2013  . Hyperlipidemia 04/27/2007  . Unspecified essential hypertension 08/08/2013  . Coronary atherosclerosis of native coronary artery   . Other malaise and fatigue   . Cough   . Other fall   . Unspecified vitamin D deficiency   . TIA  (transient ischemic attack)     "a few one summer" (08/07/2013)  . Myocardial infarction (HCC) ~ 1970  . Exertional shortness of breath     "sometimes" (08/07/2013)  . Type II diabetes mellitus (HCC)     "fasting 90-110s" (08/07/2013)  . Osteoarthrosis, unspecified whether generalized or localized, unspecified site 04/27/2007  . Prepatellar bursitis   . Arthritis     "in q joint" (08/07/2013)  . Stroke (HCC) 01/17/2014  . Seizures (HCC)   . Muscle spasm    Past Surgical History  Procedure Laterality Date  . Tonsillectomy  08/1974  . Replacement total knee Right 09/2005  . Shoulder open rotator cuff repair Right 07/1999  . Appendectomy  1960  . Abdominal hysterectomy  04/1980  . Transthoracic echocardiogram  2003    EF 55-65%; mild concentric LVH  . Joint replacement    . Total knee arthroplasty Left 08/07/2013  . Cataract extraction w/ intraocular lens  implant, bilateral Bilateral ~ 2012  . Total knee arthroplasty Left 08/07/2013    Procedure: TOTAL KNEE ARTHROPLASTY- LEFT;  Surgeon: Dannielle Huh, MD;  Location: [facility] OR;  Service: Orthopedics;  Laterality: Left;  . Cardiac catheterization  2003   Family History  Problem Relation Age of Onset  . Heart attack Father    Social History  Substance Use Topics  . Smoking status: Never Smoker   . Smokeless tobacco: Never Used  . Alcohol Use: No   OB History    No data available  Review of Systems  Constitutional: Negative for fever and chills.  HENT: Positive for congestion. Negative for rhinorrhea.   Eyes: Negative for redness and visual disturbance.  Respiratory: Positive for cough. Negative for shortness of breath and wheezing.   Cardiovascular: Positive for chest pain. Negative for palpitations and leg swelling.  Gastrointestinal: Positive for abdominal pain (RLQ). Negative for nausea, vomiting and diarrhea.  Genitourinary: Negative for dysuria, urgency and frequency.  Musculoskeletal: Negative for myalgias and arthralgias.        + Bilateral feet swelling  Skin: Negative for pallor and wound.  Neurological: Positive for weakness (Generalized). Negative for dizziness and headaches.   Allergies  Cetirizine hcl; Codeine; Fish allergy; Indomethacin; Lyrica; Methyldopa; Shellfish allergy; and Tomato  Home Medications   Prior to Admission medications   Medication Sig Start Date End Date Taking? Authorizing Provider  allopurinol (ZYLOPRIM) 100 MG tablet TAKE 1 TABLET BY MOUTH EVERY DAY FOR GOUT Patient taking differently: Take 100 mg by mouth daily.  06/10/15  Yes Kirt Boys, DO  aspirin EC 81 MG tablet Take 81 mg by mouth daily.   Yes Historical Provider, MD  benzonatate (TESSALON) 100 MG capsule take 2 capsules by mouth three times a day if needed for cough 09/09/15  Yes Kimber Relic, MD  calcium-vitamin D (OSCAL WITH D) 500-200 MG-UNIT per tablet Take 1 tablet by mouth 2 (two) times daily.   Yes Historical Provider, MD  Cholecalciferol (VITAMIN D-3) 1000 UNITS CAPS Take 1 capsule by mouth daily.   Yes Historical Provider, MD  cloNIDine (CATAPRES) 0.1 MG tablet Take 0.1 mg by mouth daily as needed (blood presure is above 160). For SBP>160 02/06/14  Yes Mahima Pandey, MD  ferrous sulfate 325 (65 FE) MG tablet Take 325 mg by mouth 2 (two) times daily with a meal.   Yes Historical Provider, MD  folic acid (FOLVITE) 1 MG tablet Take 2 mg by mouth daily.    Yes Historical Provider, MD  HUMIRA PEN 40 MG/0.8ML PNKT Inject 1 each as directed every 14 (fourteen) days. 09/10/15  Yes Historical Provider, MD  hydrALAZINE (APRESOLINE) 25 MG tablet Take 25 mg by mouth 2 (two) times daily.   Yes Historical Provider, MD  labetalol (NORMODYNE) 100 MG tablet Take 2 tablets (200 mg total) by mouth 2 (two) times daily. 06/05/15  Yes Kirt Boys, DO  lisinopril (PRINIVIL,ZESTRIL) 10 MG tablet Take 2 tablets by mouth daily for blood pressure 07/05/15  Yes Kirt Boys, DO  metFORMIN (GLUCOPHAGE) 500 MG tablet TAKE 1 TABLET BY MOUTH  EVERY DAY FOR DIABETES Patient taking differently: Take 500 mg by mouth daily with breakfast.  06/05/15  Yes Kirt Boys, DO  methotrexate (RHEUMATREX) 2.5 MG tablet Provided by rheumatology- started on 4 tablets every week for 2 weeks, then 6 tab every week for 2 weeks, then 8 tab every week 06/27/14  Yes Mahima Pandey, MD  OVER THE COUNTER MEDICATION Take 1 tablet by mouth 2 (two) times daily. Wellness formula for immune support   Yes Historical Provider, MD  OVER THE COUNTER MEDICATION Take 5 mLs by mouth every morning. Chloraphil liquid, mix in apply juice   Yes Historical Provider, MD  oxycodone (OXY-IR) 5 MG capsule Take 1 capsule (5 mg total) by mouth every 4 (four) hours as needed for pain (Arthritis pain). 07/30/15  Yes Kimber Relic, MD  polyvinyl alcohol (LIQUIFILM TEARS) 1.4 % ophthalmic solution Place 1 drop into both eyes as needed (for dry eyes).   Yes Historical Provider,  MD  predniSONE (DELTASONE) 1 MG tablet Take 1 mg by mouth daily with breakfast.    Yes Historical Provider, MD  traMADol (ULTRAM) 50 MG tablet take 1-2 tablets by mouth every 6 hours for MODERATE pain 07/18/15  Yes Sharon Seller, NP  triamcinolone cream (KENALOG) 0.1 % APPLY TO AFFECTED AREA TWICE DAILY AS NEEDED FOR IRRITATION Patient taking differently: Apply 1 application topically daily as needed (for irritation).  04/10/15  Yes Kirt Boys, DO  vitamin C (ASCORBIC ACID) 500 MG tablet Take 500 mg by mouth daily.   Yes Historical Provider, MD  glucose blood (ONE TOUCH ULTRA TEST) test strip Check blood sugar once daily DX 250.00 05/18/14   Tiffany L Reed, DO   Triage Vitals: BP 145/77 mmHg  Pulse 101  Temp(Src) 99.4 F (37.4 C) (Oral)  Resp 25  SpO2 98%   Physical Exam  Constitutional: She is oriented to person, place, and time. She appears well-developed and well-nourished. No distress.  HENT:  Head: Normocephalic and atraumatic.  Eyes: EOM are normal. Pupils are equal, round, and reactive to light.   Neck: Normal range of motion. Neck supple.  Cardiovascular: Normal rate and regular rhythm.  Exam reveals no gallop and no friction rub.   No murmur heard. Pulmonary/Chest: Effort normal. She has no wheezes. She has rhonchi. She has no rales.  Rhonchi that were worse in right lower lung field but scattered throughout  Abdominal: Soft. She exhibits no distension. There is tenderness. There is no rebound.  RLQ tenderness right an McBurney's Point  Musculoskeletal: She exhibits edema. She exhibits no tenderness.  Swelling in bilateral feet with intact pulses No noted swelling above the ankles  Neurological: She is alert and oriented to person, place, and time.  Skin: Skin is warm and dry. She is not diaphoretic.  Psychiatric: She has a normal mood and affect. Her behavior is normal.  Nursing note and vitals reviewed.   ED Course  Procedures (including critical care time)  DIAGNOSTIC STUDIES: Oxygen Saturation is 98% on RA, normal by my interpretation.    COORDINATION OF CARE: 11:34 PM-Discussed treatment plan which includes CT A/P, CMP, Lipase, UA, Urine culture, CBC, and Troponin with pt at bedside and pt agreed to plan.   Labs Review Labs Reviewed  CBC - Abnormal; Notable for the following:    RBC 2.98 (*)    Hemoglobin 9.3 (*)    HCT 27.4 (*)    RDW 17.8 (*)    All other components within normal limits  COMPREHENSIVE METABOLIC PANEL - Abnormal; Notable for the following:    Sodium 134 (*)    Glucose, Bld 105 (*)    ALT 11 (*)    GFR calc non Af Amer 53 (*)    All other components within normal limits  BRAIN NATRIURETIC PEPTIDE - Abnormal; Notable for the following:    B Natriuretic Peptide 336.8 (*)    All other components within normal limits  I-STAT CHEM 8, ED - Abnormal; Notable for the following:    Sodium 134 (*)    Chloride 100 (*)    BUN 21 (*)    Creatinine, Ser 1.10 (*)    Glucose, Bld 106 (*)    Hemoglobin 10.5 (*)    HCT 31.0 (*)    All other components  within normal limits  I-STAT CG4 LACTIC ACID, ED - Abnormal; Notable for the following:    Lactic Acid, Venous 0.49 (*)    All other components within normal  limits  URINE CULTURE  URINALYSIS, ROUTINE W REFLEX MICROSCOPIC (NOT AT [facility])  LIPASE, BLOOD  TROPONIN I  TROPONIN I  TROPONIN I  I-STAT TROPOININ, ED  I-STAT CG4 LACTIC ACID, ED  I-STAT TROPOININ, ED  Rosezena Sensor, ED    Imaging Review Dg Chest 2 View  09/10/2015  CLINICAL DATA:  Fever and body aches for 24 hours. Upper chest pain. EXAM: CHEST  2 VIEW COMPARISON:  11/21/2014 FINDINGS: There is moderate cardiomegaly and aortic tortuosity, unchanged. No airspace consolidation. No effusions. Pulmonary vasculature is normal. Severe chronic shoulder arthropathy bilaterally. IMPRESSION: Cardiomegaly.  No acute cardiopulmonary findings. Electronically Signed   By: Ellery Plunk [name]   On: 09/10/2015 23:29   Ct Angio Chest Pe W/cm &/or Wo Cm  09/11/2015  CLINICAL DATA:  Midchest pain.  Right lower quadrant pain. EXAM: CT ANGIOGRAPHY CHEST CT ABDOMEN AND PELVIS WITH CONTRAST TECHNIQUE: Multidetector CT imaging of the chest was performed using the standard protocol during bolus administration of intravenous contrast. Multiplanar CT image reconstructions and MIPs were obtained to evaluate the vascular anatomy. Multidetector CT imaging of the abdomen and pelvis was performed using the standard protocol during bolus administration of intravenous contrast. CONTRAST:  OMNIPAQUE IOHEXOL 350 MG/ML SOLN COMPARISON:  06/12/2014 FINDINGS: CTA CHEST FINDINGS Cardiovascular: There is good opacification of the pulmonary arteries. There is no pulmonary embolism. The thoracic aorta is normal in caliber and intact. Lungs: Mild ground-glass opacities, central predominant, possibly due to alveolar edema. No pulmonary masses or nodules. Central airways: Patent Effusions: None Lymphadenopathy: None Esophagus: Unremarkable Musculoskeletal: No significant  abnormality. Moderate degenerative disc changes in the thoracic spine. CT ABDOMEN and PELVIS FINDINGS Hepatobiliary: Normal liver. Upper normal caliber of the extrahepatic biliary system. No intrahepatic bile duct dilatation. No biliary calculus or mass is evident. Gallbladder is unremarkable. Pancreas: Normal Spleen: Normal Adrenals/Urinary Tract: Benign cyst at the lateral aspect of the right kidney. Otherwise normal kidneys, ureters, and adrenals. Urinary bladder is empty but grossly unremarkable. Stomach/Bowel: Small hiatal hernia. Stomach and small bowel are otherwise unremarkable. Colon is remarkable only for moderate uncomplicated colonic diverticulosis. Prior appendectomy Vascular/Lymphatic: The abdominal aorta is normal in caliber. There is mild atherosclerotic calcification. There is no adenopathy in the abdomen or pelvis. Reproductive: Hysterectomy.  No adnexal abnormalities. Other: No acute inflammatory changes are evident in the abdomen or pelvis. There is no ascites. Musculoskeletal: Moderately severe degenerative lumbar disc disease. Grade 1 spondylolisthesis at L3-4 and L4-5. Facet arthropathy from L2 through the sacrum. No significant skeletal lesions. Review of the MIP images confirms the above findings. IMPRESSION: 1. Negative for acute pulmonary embolism. 2. Central ground-glass opacities in both lungs may represent alveolar edema 3. No acute findings in the abdomen or pelvis. 4. Small hiatal hernia. 5. Diverticulosis. Electronically Signed   By: Ellery Plunk [name]   On: 09/11/2015 02:12   Ct Abdomen Pelvis W Contrast  09/11/2015  CLINICAL DATA:  Midchest pain.  Right lower quadrant pain. EXAM: CT ANGIOGRAPHY CHEST CT ABDOMEN AND PELVIS WITH CONTRAST TECHNIQUE: Multidetector CT imaging of the chest was performed using the standard protocol during bolus administration of intravenous contrast. Multiplanar CT image reconstructions and MIPs were obtained to evaluate the vascular anatomy.  Multidetector CT imaging of the abdomen and pelvis was performed using the standard protocol during bolus administration of intravenous contrast. CONTRAST:  OMNIPAQUE IOHEXOL 350 MG/ML SOLN COMPARISON:  06/12/2014 FINDINGS: CTA CHEST FINDINGS Cardiovascular: There is good opacification of the pulmonary arteries. There is no pulmonary embolism.  The thoracic aorta is normal in caliber and intact. Lungs: Mild ground-glass opacities, central predominant, possibly due to alveolar edema. No pulmonary masses or nodules. Central airways: Patent Effusions: None Lymphadenopathy: None Esophagus: Unremarkable Musculoskeletal: No significant abnormality. Moderate degenerative disc changes in the thoracic spine. CT ABDOMEN and PELVIS FINDINGS Hepatobiliary: Normal liver. Upper normal caliber of the extrahepatic biliary system. No intrahepatic bile duct dilatation. No biliary calculus or mass is evident. Gallbladder is unremarkable. Pancreas: Normal Spleen: Normal Adrenals/Urinary Tract: Benign cyst at the lateral aspect of the right kidney. Otherwise normal kidneys, ureters, and adrenals. Urinary bladder is empty but grossly unremarkable. Stomach/Bowel: Small hiatal hernia. Stomach and small bowel are otherwise unremarkable. Colon is remarkable only for moderate uncomplicated colonic diverticulosis. Prior appendectomy Vascular/Lymphatic: The abdominal aorta is normal in caliber. There is mild atherosclerotic calcification. There is no adenopathy in the abdomen or pelvis. Reproductive: Hysterectomy.  No adnexal abnormalities. Other: No acute inflammatory changes are evident in the abdomen or pelvis. There is no ascites. Musculoskeletal: Moderately severe degenerative lumbar disc disease. Grade 1 spondylolisthesis at L3-4 and L4-5. Facet arthropathy from L2 through the sacrum. No significant skeletal lesions. Review of the MIP images confirms the above findings. IMPRESSION: 1. Negative for acute pulmonary embolism. 2. Central  ground-glass opacities in both lungs may represent alveolar edema 3. No acute findings in the abdomen or pelvis. 4. Small hiatal hernia. 5. Diverticulosis. Electronically Signed   By: Ellery Plunk [name]   On: 09/11/2015 02:12   I have personally reviewed and evaluated these images and lab results as part of my medical decision-making.   EKG Interpretation   Date/Time:  Tuesday September 10 2015 22:01:26 EDT Ventricular Rate:  103 PR Interval:  187 QRS Duration: 87 QT Interval:  367 QTC Calculation: 480 R Axis:   -22 Text Interpretation:  Sinus tachycardia Borderline left axis deviation ST  elevation, consider inferior injury new flipped t wave avL Confirmed by  Aleeya Veitch MD, DANIEL [phone]) on 09/11/2015 12:29:20 AM    EKG Interpretation  Date/Time:  Wednesday September 11 2015 00:34:15 EDT Ventricular Rate:  97 PR Interval:  176 QRS Duration: 91 QT Interval:  381 QTC Calculation: 484 R Axis:   -17 Text Interpretation:  Sinus tachycardia Ventricular bigeminy LVH by voltage Baseline wander in lead(s) V6 No significant change since last tracing Confirmed by Valentino Saavedra MD, DANIEL ([id number]) on 09/11/2015 2:47:00 AM           MDM   Final diagnoses:  Chest pain, unspecified chest pain type  Cough     81 yo F with a chief complaints of chest pain. Patient states is been going on for at least a day. To see with some weakness and shortness of breath. Patient saw her PCP yesterday for a persistent cough is been going for the past 2 weeks. Patient having some subjective fevers and chills at home. Patient denied diaphoresis denied radiation of the pain. Patient states that she has had an MI in the past but was unable to quantify further. Unable to ascertain further cardiac history based on notes in the computer. Patient has history of hypertension hyperlipidemia diabetes. Patient denies smoking. Patient is 55.  Patient has heart score of 6. CT scan of the chest abdomen pelvis performed with patient  having also right lower quadrant tenderness. CT with groundglass and alveolar edema. BNP.  mildly elevated. Attempted the page cardiology 2.   Spoke with Dr. Shirlee Latch, ok with admission here for CP rule out.   The patients  results and plan were reviewed and discussed.   Any x-rays performed were independently reviewed by myself.   Differential diagnosis were considered with the presenting HPI.  Medications  nitroGLYCERIN (NITROSTAT) SL tablet 0.4 mg (0.4 mg Sublingual Given 09/11/15 0034)  acetaminophen (TYLENOL) tablet 650 mg (not administered)  ondansetron (ZOFRAN) injection 4 mg (not administered)  heparin injection 5,000 Units (not administered)  allopurinol (ZYLOPRIM) tablet 100 mg (not administered)  aspirin EC tablet 81 mg (not administered)  benzonatate (TESSALON) capsule 200 mg (not administered)  ferrous sulfate tablet 325 mg (not administered)  hydrALAZINE (APRESOLINE) tablet 25 mg (not administered)  labetalol (NORMODYNE) tablet 200 mg (not administered)  predniSONE (DELTASONE) tablet 1 mg (not administered)  oxycodone (OXY-IR) immediate release capsule 5 mg (not administered)  lisinopril (PRINIVIL,ZESTRIL) tablet 20 mg (not administered)  traMADol (ULTRAM) tablet 50 mg (not administered)  aspirin chewable tablet 324 mg (324 mg Oral Given 09/11/15 0025)  acetaminophen (TYLENOL) tablet 1,000 mg (1,000 mg Oral Given 09/11/15 0027)  sodium chloride 0.9 % bolus 500 mL (0 mLs Intravenous Stopped 09/11/15 0230)  iohexol (OMNIPAQUE) 350 MG/ML injection 100 mL (100 mLs Intravenous Contrast Given 09/11/15 0137)    Filed Vitals:   09/11/15 0300 09/11/15 0400 09/11/15 0500 09/11/15 0512  BP: 148/81 135/66 147/76 147/76  Pulse: 97 96 99 97  Temp:    98.2 F (36.8 C)  TempSrc:    Oral  Resp: 16 7 11 18   SpO2: 98% 99% 96% 99%    Final diagnoses:  Chest pain, unspecified chest pain type  Cough    Admission/ observation were discussed with the admitting physician, patient and/or  family and they are comfortable with the plan.     , DO 09/11/15 [phone]

## 2015-09-10 NOTE — ED Notes (Signed)
Patient c/o chest tightness and weakness x1 day. Seen yesterday at PCP and advised to come to ED.

## 2015-09-11 ENCOUNTER — Encounter (HOSPITAL_COMMUNITY): Payer: Self-pay

## 2015-09-11 DIAGNOSIS — M069 Rheumatoid arthritis, unspecified: Secondary | ICD-10-CM | POA: Diagnosis not present

## 2015-09-11 DIAGNOSIS — R079 Chest pain, unspecified: Secondary | ICD-10-CM | POA: Diagnosis not present

## 2015-09-11 DIAGNOSIS — I10 Essential (primary) hypertension: Secondary | ICD-10-CM | POA: Diagnosis not present

## 2015-09-11 DIAGNOSIS — E1129 Type 2 diabetes mellitus with other diabetic kidney complication: Secondary | ICD-10-CM | POA: Diagnosis not present

## 2015-09-11 LAB — URINALYSIS, ROUTINE W REFLEX MICROSCOPIC
Bilirubin Urine: NEGATIVE
Glucose, UA: NEGATIVE mg/dL
Hgb urine dipstick: NEGATIVE
Ketones, ur: NEGATIVE mg/dL
Leukocytes, UA: NEGATIVE
Nitrite: NEGATIVE
Protein, ur: NEGATIVE mg/dL
Specific Gravity, Urine: 1.014 (ref 1.005–1.030)
Urobilinogen, UA: 1 mg/dL (ref 0.0–1.0)
pH: 7.5 (ref 5.0–8.0)

## 2015-09-11 LAB — COMPREHENSIVE METABOLIC PANEL
ALT: 11 U/L — ABNORMAL LOW (ref 14–54)
AST: 16 U/L (ref 15–41)
Albumin: 3.6 g/dL (ref 3.5–5.0)
Alkaline Phosphatase: 58 U/L (ref 38–126)
Anion gap: 7 (ref 5–15)
BUN: 20 mg/dL (ref 6–20)
CO2: 24 mmol/L (ref 22–32)
Calcium: 9.3 mg/dL (ref 8.9–10.3)
Chloride: 103 mmol/L (ref 101–111)
Creatinine, Ser: 0.96 mg/dL (ref 0.44–1.00)
GFR calc Af Amer: >60 mL/min (ref >60)
GFR calc non Af Amer: 53 mL/min — ABNORMAL LOW (ref >60)
Glucose, Bld: 105 mg/dL — ABNORMAL HIGH (ref 65–99)
Potassium: 4.1 mmol/L (ref 3.5–5.1)
Sodium: 134 mmol/L — ABNORMAL LOW (ref 135–145)
Total Bilirubin: 0.6 mg/dL (ref 0.3–1.2)
Total Protein: 7.4 g/dL (ref 6.5–8.1)

## 2015-09-11 LAB — I-STAT TROPONIN, ED
Troponin i, poc: 0 ng/mL (ref 0.00–0.08)
Troponin i, poc: 0 ng/mL (ref 0.00–0.08)
Troponin i, poc: 0.01 ng/mL (ref 0.00–0.08)

## 2015-09-11 LAB — GLUCOSE, CAPILLARY
Glucose-Capillary: 87 mg/dL (ref 65–99)
Glucose-Capillary: 88 mg/dL (ref 65–99)
Glucose-Capillary: 109 mg/dL — ABNORMAL HIGH (ref 65–99)
Glucose-Capillary: 117 mg/dL — ABNORMAL HIGH (ref 65–99)
Glucose-Capillary: 93 mg/dL (ref 65–99)

## 2015-09-11 LAB — CBC
HCT: 27.4 % — ABNORMAL LOW (ref 36.0–46.0)
Hemoglobin: 9.3 g/dL — ABNORMAL LOW (ref 12.0–15.0)
MCH: 31.2 pg (ref 26.0–34.0)
MCHC: 33.9 g/dL (ref 30.0–36.0)
MCV: 91.9 fL (ref 78.0–100.0)
Platelets: 342 10*3/uL (ref 150–400)
RBC: 2.98 MIL/uL — ABNORMAL LOW (ref 3.87–5.11)
RDW: 17.8 % — ABNORMAL HIGH (ref 11.5–15.5)
WBC: 8.5 10*3/uL (ref 4.0–10.5)

## 2015-09-11 LAB — I-STAT CHEM 8, ED
BUN: 21 mg/dL — ABNORMAL HIGH (ref 6–20)
Calcium, Ion: 1.24 mmol/L (ref 1.13–1.30)
Chloride: 100 mmol/L — ABNORMAL LOW (ref 101–111)
Creatinine, Ser: 1.1 mg/dL — ABNORMAL HIGH (ref 0.44–1.00)
Glucose, Bld: 106 mg/dL — ABNORMAL HIGH (ref 65–99)
HCT: 31 % — ABNORMAL LOW (ref 36.0–46.0)
Hemoglobin: 10.5 g/dL — ABNORMAL LOW (ref 12.0–15.0)
Potassium: 4.1 mmol/L (ref 3.5–5.1)
Sodium: 134 mmol/L — ABNORMAL LOW (ref 135–145)
TCO2: 23 mmol/L (ref 0–100)

## 2015-09-11 LAB — BRAIN NATRIURETIC PEPTIDE: B Natriuretic Peptide: 336.8 pg/mL — ABNORMAL HIGH (ref 0.0–100.0)

## 2015-09-11 LAB — TROPONIN I
Troponin I: <0.03 ng/mL (ref <0.031)
Troponin I: <0.03 ng/mL (ref <0.031)
Troponin I: <0.03 ng/mL (ref <0.031)

## 2015-09-11 LAB — I-STAT CG4 LACTIC ACID, ED
Lactic Acid, Venous: 0.49 mmol/L — ABNORMAL LOW (ref 0.5–2.0)
Lactic Acid, Venous: 0.93 mmol/L (ref 0.5–2.0)

## 2015-09-11 LAB — LIPASE, BLOOD: Lipase: 30 U/L (ref 11–51)

## 2015-09-11 MED ORDER — SODIUM CHLORIDE 0.9 % IV BOLUS (SEPSIS)
500.0000 mL | Freq: Once | INTRAVENOUS | Status: AC
  Administered 2015-09-11: 500 mL via INTRAVENOUS

## 2015-09-11 MED ORDER — BENZONATATE 100 MG PO CAPS
200.0000 mg | ORAL_CAPSULE | Freq: Three times a day (TID) | ORAL | Status: DC | PRN
  Administered 2015-09-11 – 2015-09-12 (×3): 200 mg via ORAL
  Filled 2015-09-11 (×3): qty 2

## 2015-09-11 MED ORDER — ALLOPURINOL 100 MG PO TABS
100.0000 mg | ORAL_TABLET | Freq: Every day | ORAL | Status: DC
  Administered 2015-09-11 – 2015-09-12 (×2): 100 mg via ORAL
  Filled 2015-09-11 (×2): qty 1

## 2015-09-11 MED ORDER — ACETAMINOPHEN 325 MG PO TABS
650.0000 mg | ORAL_TABLET | ORAL | Status: DC | PRN
  Administered 2015-09-11: 650 mg via ORAL
  Filled 2015-09-11: qty 2

## 2015-09-11 MED ORDER — ENSURE ENLIVE PO LIQD
237.0000 mL | Freq: Two times a day (BID) | ORAL | Status: DC
  Administered 2015-09-11 (×2): 237 mL via ORAL

## 2015-09-11 MED ORDER — OXYCODONE HCL 5 MG PO TABS: 5.0000 mg | ORAL_TABLET | ORAL | Status: DC | PRN

## 2015-09-11 MED ORDER — LABETALOL HCL 200 MG PO TABS
200.0000 mg | ORAL_TABLET | Freq: Two times a day (BID) | ORAL | Status: DC
  Administered 2015-09-11 – 2015-09-12 (×3): 200 mg via ORAL
  Filled 2015-09-11 (×2): qty 1
  Filled 2015-09-11 (×3): qty 2
  Filled 2015-09-11 (×2): qty 1

## 2015-09-11 MED ORDER — PREDNISONE 1 MG PO TABS
1.0000 mg | ORAL_TABLET | Freq: Every day | ORAL | Status: DC
  Administered 2015-09-11 – 2015-09-12 (×2): 1 mg via ORAL
  Filled 2015-09-11 (×3): qty 1

## 2015-09-11 MED ORDER — PNEUMOCOCCAL VAC POLYVALENT 25 MCG/0.5ML IJ INJ
0.5000 mL | INJECTION | INTRAMUSCULAR | Status: AC
  Administered 2015-09-12: 0.5 mL via INTRAMUSCULAR
  Filled 2015-09-11 (×2): qty 0.5

## 2015-09-11 MED ORDER — ONDANSETRON HCL 4 MG/2ML IJ SOLN: 4.0000 mg | Freq: Four times a day (QID) | INTRAMUSCULAR | Status: DC | PRN

## 2015-09-11 MED ORDER — IOHEXOL 350 MG/ML SOLN
100.0000 mL | Freq: Once | INTRAVENOUS | Status: AC | PRN
  Administered 2015-09-11: 100 mL via INTRAVENOUS

## 2015-09-11 MED ORDER — FUROSEMIDE 10 MG/ML IJ SOLN
20.0000 mg | Freq: Two times a day (BID) | INTRAMUSCULAR | Status: DC
  Administered 2015-09-11 (×2): 20 mg via INTRAVENOUS
  Filled 2015-09-11 (×2): qty 2

## 2015-09-11 MED ORDER — TRAMADOL HCL 50 MG PO TABS: 50.0000 mg | ORAL_TABLET | Freq: Four times a day (QID) | ORAL | Status: DC | PRN

## 2015-09-11 MED ORDER — HYDRALAZINE HCL 25 MG PO TABS
25.0000 mg | ORAL_TABLET | Freq: Two times a day (BID) | ORAL | Status: DC
  Administered 2015-09-11 – 2015-09-12 (×3): 25 mg via ORAL
  Filled 2015-09-11 (×3): qty 1

## 2015-09-11 MED ORDER — LISINOPRIL 20 MG PO TABS
20.0000 mg | ORAL_TABLET | Freq: Every day | ORAL | Status: DC
  Administered 2015-09-11 – 2015-09-12 (×2): 20 mg via ORAL
  Filled 2015-09-11 (×2): qty 1

## 2015-09-11 MED ORDER — HEPARIN SODIUM (PORCINE) 5000 UNIT/ML IJ SOLN
5000.0000 [IU] | Freq: Three times a day (TID) | INTRAMUSCULAR | Status: DC
  Administered 2015-09-11 – 2015-09-12 (×4): 5000 [IU] via SUBCUTANEOUS
  Filled 2015-09-11 (×4): qty 1

## 2015-09-11 MED ORDER — METFORMIN HCL 500 MG PO TABS: 500.0000 mg | ORAL_TABLET | Freq: Every day | ORAL | Status: DC

## 2015-09-11 MED ORDER — ASPIRIN EC 81 MG PO TBEC
81.0000 mg | DELAYED_RELEASE_TABLET | Freq: Every day | ORAL | Status: DC
  Administered 2015-09-11 – 2015-09-12 (×2): 81 mg via ORAL
  Filled 2015-09-11 (×2): qty 1

## 2015-09-11 MED ORDER — FERROUS SULFATE 325 (65 FE) MG PO TABS
325.0000 mg | ORAL_TABLET | Freq: Two times a day (BID) | ORAL | Status: DC
  Administered 2015-09-11 – 2015-09-12 (×3): 325 mg via ORAL
  Filled 2015-09-11 (×3): qty 1

## 2015-09-11 NOTE — Progress Notes (Signed)
Nutrition Brief Note  Patient identified on the Malnutrition Screening Tool (MST) Report  Wt Readings from Last 15 Encounters:  09/11/15 178 lb 6.4 oz (80.922 kg)  09/09/15 182 lb 3.2 oz (82.645 kg)  07/26/15 184 lb (83.462 kg)  07/23/15 183 lb (83.008 kg)  07/03/15 183 lb (83.008 kg)  06/20/15 183 lb 3.2 oz (83.1 kg)  06/19/15 148 lb (67.132 kg)  04/24/15 148 lb (67.132 kg)  03/27/15 148 lb (67.132 kg)  03/22/15 148 lb 9.6 oz (67.405 kg)  01/23/15 184 lb 6.4 oz (83.643 kg)  11/27/14 184 lb (83.462 kg)  09/26/14 189 lb (85.73 kg)  09/11/14 191 lb (86.637 kg)  07/03/14 195 lb 9.6 oz (88.724 kg)    Body mass index is 30.61 kg/(m^2). Patient meets criteria for obesity based on current BMI.   Pt screened for MST. She ate 50% of oatmeal and eggs for breakfast. She states that ate home she typically has oatmeal or eggs but not both at the same time. She also uses medications to take applesauce. She states that appetite has decreased as she has gotten older and that she has also cut back on portion sizes in an effort to lose, and not gain, weight due to limited mobility. She follows a low sodium Na diet and monitors carbohydrate intake at home to aid in glycemic control.   Pt has Ensure Enlive ordered BID and drank 50% of one this AM and plans to save the rest for later. Per review, pt hast lost 5 lbs (2.7% body weight) in the past 3 months which is not significant for time frame.  Current diet order is Carb Modified, patient is consuming approximately 50% of meals at this time. Labs and medications reviewed.   No nutrition interventions warranted at this time. If nutrition issues arise, please consult RD.      Trenton Gammon, RD, LDN Inpatient Clinical Dietitian Pager # [phone] After hours/weekend pager # [phone]

## 2015-09-11 NOTE — ED Notes (Signed)
Patient may have water per Dr Adela Lank.

## 2015-09-11 NOTE — ED Notes (Signed)
Pt was not able to urinate at this time.  Will try again later.

## 2015-09-11 NOTE — Progress Notes (Signed)
Received pt from ED, Admission history completed. Pt stable without distress, daughter at the bedside. Pt a/o able to answer questions appropriately. Up to bedside commode with assistance. SRP, RN

## 2015-09-11 NOTE — H&P (Addendum)
[facility] History and Physical  [name] [name] JGO:[id number] DOB: January 20, 1930 DOA: 09/10/2015  Referring physician: EDP PCP: [name] Boys, DO   Chief Complaint: Chest pain   HPI: [name] [name] is a 81 y.o. female with h/o RA, CAD.  Patient presents to the ED with c/o gradual onset, constant, chest "pressure" that onset 1 day ago.  Pain located in middle of chest, no radiation.  She has been suffering from chronic cough that JUST got better over the past couple of days when she started taking tessalon perles.  She is unsure if the cough is related to the chest pain.  She also had a couple of BMs with blood in them yesterday that have spontaneously resolved.  Review of Systems: Systems reviewed.  As above, otherwise negative  Past Medical History  Diagnosis Date  . Hypertension   . First degree atrioventricular block   . Onychia and paronychia of toe   . Muscle weakness (generalized)   . Allergic rhinitis due to pollen 04/27/2007  . Gout, unspecified 04/19/2013  . Disorder of bone and cartilage, unspecified   . Edema 05/03/2013  . Hyperlipidemia 04/27/2007  . Unspecified essential hypertension 08/08/2013  . Coronary atherosclerosis of native coronary artery   . Other malaise and fatigue   . Cough   . Other fall   . Unspecified vitamin D deficiency   . TIA (transient ischemic attack)     "a few one summer" (08/07/2013)  . Myocardial infarction (HCC) ~ 1970  . Exertional shortness of breath     "sometimes" (08/07/2013)  . Type II diabetes mellitus (HCC)     "fasting 90-110s" (08/07/2013)  . Osteoarthrosis, unspecified whether generalized or localized, unspecified site 04/27/2007  . Prepatellar bursitis   . Arthritis     "in q joint" (08/07/2013)  . Stroke (HCC) 01/17/2014  . Seizures (HCC)   . Muscle spasm    Past Surgical History  Procedure Laterality Date  . Tonsillectomy  08/1974  . Replacement total knee Right 09/2005  . Shoulder open rotator cuff repair  Right 07/1999  . Appendectomy  1960  . Abdominal hysterectomy  04/1980  . Transthoracic echocardiogram  2003    EF 55-65%; mild concentric LVH  . Joint replacement    . Total knee arthroplasty Left 08/07/2013  . Cataract extraction w/ intraocular lens  implant, bilateral Bilateral ~ 2012  . Total knee arthroplasty Left 08/07/2013    Procedure: TOTAL KNEE ARTHROPLASTY- LEFT;  Surgeon: Dannielle Huh, MD;  Location: [facility] OR;  Service: Orthopedics;  Laterality: Left;  . Cardiac catheterization  2003   Social History:  reports that she has never smoked. She has never used smokeless tobacco. She reports that she does not drink alcohol or use illicit drugs.  Allergies  Allergen Reactions  . Cetirizine Hcl Other (See Comments)    unknown  . Codeine Other (See Comments)    unknown  . Fish Allergy   . Indomethacin Other (See Comments)    unknown  . Lyrica [Pregabalin]     hallucinations  . Methyldopa Other (See Comments)    unknown  . Shellfish Allergy Hives  . Tomato Rash    Family History  Problem Relation Age of Onset  . Heart attack Father      Prior to Admission medications   Medication Sig Start Date End Date Taking? Authorizing Provider  allopurinol (ZYLOPRIM) 100 MG tablet TAKE 1 TABLET BY MOUTH EVERY DAY FOR GOUT Patient taking differently: Take 100 mg by mouth  daily.  06/10/15  Yes [name] Boys, DO  aspirin EC 81 MG tablet Take 81 mg by mouth daily.   Yes Historical Provider, MD  benzonatate (TESSALON) 100 MG capsule take 2 capsules by mouth three times a day if needed for cough 09/09/15  Yes [name] Relic, MD  calcium-vitamin D (OSCAL WITH D) 500-200 MG-UNIT per tablet Take 1 tablet by mouth 2 (two) times daily.   Yes Historical Provider, MD  Cholecalciferol (VITAMIN D-3) 1000 UNITS CAPS Take 1 capsule by mouth daily.   Yes Historical Provider, MD  cloNIDine (CATAPRES) 0.1 MG tablet Take 0.1 mg by mouth daily as needed (blood presure is above 160). For SBP>160 02/06/14  Yes [name]  Pandey, MD  ferrous sulfate 325 (65 FE) MG tablet Take 325 mg by mouth 2 (two) times daily with a meal.   Yes Historical Provider, MD  folic acid (FOLVITE) 1 MG tablet Take 2 mg by mouth daily.    Yes Historical Provider, MD  HUMIRA PEN 40 MG/0.8ML PNKT Inject 1 each as directed every 14 (fourteen) days. 09/10/15  Yes Historical Provider, MD  hydrALAZINE (APRESOLINE) 25 MG tablet Take 25 mg by mouth 2 (two) times daily.   Yes Historical Provider, MD  labetalol (NORMODYNE) 100 MG tablet Take 2 tablets (200 mg total) by mouth 2 (two) times daily. 06/05/15  Yes [name] Boys, DO  lisinopril (PRINIVIL,ZESTRIL) 10 MG tablet Take 2 tablets by mouth daily for blood pressure 07/05/15  Yes [name] Boys, DO  metFORMIN (GLUCOPHAGE) 500 MG tablet TAKE 1 TABLET BY MOUTH EVERY DAY FOR DIABETES Patient taking differently: Take 500 mg by mouth daily with breakfast.  06/05/15  Yes [name] Boys, DO  methotrexate (RHEUMATREX) 2.5 MG tablet Provided by rheumatology- started on 4 tablets every week for 2 weeks, then 6 tab every week for 2 weeks, then 8 tab every week 06/27/14  Yes [name] Pandey, MD  OVER THE COUNTER MEDICATION Take 1 tablet by mouth 2 (two) times daily. Wellness formula for immune support   Yes Historical Provider, MD  OVER THE COUNTER MEDICATION Take 5 mLs by mouth every morning. Chloraphil liquid, mix in apply juice   Yes Historical Provider, MD  oxycodone (OXY-IR) 5 MG capsule Take 1 capsule (5 mg total) by mouth every 4 (four) hours as needed for pain (Arthritis pain). 07/30/15  Yes [name] Relic, MD  polyvinyl alcohol (LIQUIFILM TEARS) 1.4 % ophthalmic solution Place 1 drop into both eyes as needed (for dry eyes).   Yes Historical Provider, MD  predniSONE (DELTASONE) 1 MG tablet Take 1 mg by mouth daily with breakfast.    Yes Historical Provider, MD  traMADol (ULTRAM) 50 MG tablet take 1-2 tablets by mouth every 6 hours for MODERATE pain 07/18/15  Yes [name] Seller, NP  triamcinolone cream (KENALOG)  0.1 % APPLY TO AFFECTED AREA TWICE DAILY AS NEEDED FOR IRRITATION Patient taking differently: Apply 1 application topically daily as needed (for irritation).  04/10/15  Yes [name] Boys, DO  vitamin C (ASCORBIC ACID) 500 MG tablet Take 500 mg by mouth daily.   Yes Historical Provider, MD  glucose blood (ONE TOUCH ULTRA TEST) test strip Check blood sugar once daily DX 250.00 05/18/14   Kermit Balo, DO   Physical Exam: Filed Vitals:   09/11/15 0300  BP: 148/81  Pulse: 97  Temp:   Resp: 16    BP 148/81 mmHg  Pulse 97  Temp(Src) 99.4 F (37.4 C) (Oral)  Resp 16  SpO2  98%  General Appearance:    Alert, oriented, no distress, appears stated age  Head:    Normocephalic, atraumatic  Eyes:    PERRL, EOMI, sclera non-icteric        Nose:   Nares without drainage or epistaxis. Mucosa, turbinates normal  Throat:   Moist mucous membranes. Oropharynx without erythema or exudate.  Neck:   Supple. No carotid bruits.  No thyromegaly.  No lymphadenopathy.   Back:     No CVA tenderness, no spinal tenderness  Lungs:     Clear to auscultation bilaterally, without wheezes, rhonchi or rales  Chest wall:    No tenderness to palpitation  Heart:    Regular rate and rhythm without murmurs, gallops, rubs  Abdomen:     Soft, non-tender, nondistended, normal bowel sounds, no organomegaly  Genitalia:    deferred  Rectal:    deferred  Extremities:   No clubbing, cyanosis or edema.  Pulses:   2+ and symmetric all extremities  Skin:   Skin color, texture, turgor normal, no rashes or lesions  Lymph nodes:   Cervical, supraclavicular, and axillary nodes normal  Neurologic:   CNII-XII intact. Normal strength, sensation and reflexes      throughout    Labs on Admission:  Basic Metabolic Panel:  Recent Labs Lab 09/09/15 09/09/15 1210 09/11/15 0035 09/11/15 0047  NA 134* 134* 134* 134*  K 4.4 4.4 4.1 4.1  CL  --  102 103 100*  CO2  --  24 24  --   GLUCOSE  --  108* 105* 106*  BUN 21*   CREATININE 0.7 0.73 0.96 1.10*  CALCIUM  --  9.4 9.3  --    Liver Function Tests:  Recent Labs Lab 09/09/15 09/09/15 1210 09/11/15 0035  AST ALT 11 11 11*  ALKPHOS 71 71 58  BILITOT  --  0.5 0.6  PROT  --  7.2 7.4  ALBUMIN  --  3.9 3.6    Recent Labs Lab 09/11/15 0035  LIPASE 30   No results for input(s): AMMONIA in the last 168 hours. CBC:  Recent Labs Lab 09/09/15 09/09/15 1210 09/11/15 0035 09/11/15 0047  WBC 7.6 7.6 8.5  --   NEUTROABS 5 4.6  --   --   HGB 10.0*  --  9.3* 10.5*  HCT 28* 28.0* 27.4* 31.0*  MCV  --   --  91.9  --   PLT 340  --  342  --    Cardiac Enzymes: No results for input(s): CKTOTAL, CKMB, CKMBINDEX, TROPONINI in the last 168 hours.  BNP (last 3 results) No results for input(s): PROBNP in the last 8760 hours. CBG: No results for input(s): GLUCAP in the last 168 hours.  Radiological Exams on Admission: Dg Chest 2 View  09/10/2015  CLINICAL DATA:  Fever and body aches for 24 hours. Upper chest pain. EXAM: CHEST  2 VIEW COMPARISON:  11/21/2014 FINDINGS: There is moderate cardiomegaly and aortic tortuosity, unchanged. No airspace consolidation. No effusions. Pulmonary vasculature is normal. Severe chronic shoulder arthropathy bilaterally. IMPRESSION: Cardiomegaly.  No acute cardiopulmonary findings. Electronically Signed   By: Ellery Plunk [name]   On: 09/10/2015 23:29   Ct Angio Chest Pe W/cm &/or Wo Cm  09/11/2015  CLINICAL DATA:  Midchest pain.  Right lower quadrant pain. EXAM: CT ANGIOGRAPHY CHEST CT ABDOMEN AND PELVIS WITH CONTRAST TECHNIQUE: Multidetector CT imaging of the chest was performed using the standard protocol during bolus administration  of intravenous contrast. Multiplanar CT image reconstructions and MIPs were obtained to evaluate the vascular anatomy. Multidetector CT imaging of the abdomen and pelvis was performed using the standard protocol during bolus administration of intravenous contrast. CONTRAST:   OMNIPAQUE IOHEXOL 350 MG/ML SOLN COMPARISON:  06/12/2014 FINDINGS: CTA CHEST FINDINGS Cardiovascular: There is good opacification of the pulmonary arteries. There is no pulmonary embolism. The thoracic aorta is normal in caliber and intact. Lungs: Mild ground-glass opacities, central predominant, possibly due to alveolar edema. No pulmonary masses or nodules. Central airways: Patent Effusions: None Lymphadenopathy: None Esophagus: Unremarkable Musculoskeletal: No significant abnormality. Moderate degenerative disc changes in the thoracic spine. CT ABDOMEN and PELVIS FINDINGS Hepatobiliary: Normal liver. Upper normal caliber of the extrahepatic biliary system. No intrahepatic bile duct dilatation. No biliary calculus or mass is evident. Gallbladder is unremarkable. Pancreas: Normal Spleen: Normal Adrenals/Urinary Tract: Benign cyst at the lateral aspect of the right kidney. Otherwise normal kidneys, ureters, and adrenals. Urinary bladder is empty but grossly unremarkable. Stomach/Bowel: Small hiatal hernia. Stomach and small bowel are otherwise unremarkable. Colon is remarkable only for moderate uncomplicated colonic diverticulosis. Prior appendectomy Vascular/Lymphatic: The abdominal aorta is normal in caliber. There is mild atherosclerotic calcification. There is no adenopathy in the abdomen or pelvis. Reproductive: Hysterectomy.  No adnexal abnormalities. Other: No acute inflammatory changes are evident in the abdomen or pelvis. There is no ascites. Musculoskeletal: Moderately severe degenerative lumbar disc disease. Grade 1 spondylolisthesis at L3-4 and L4-5. Facet arthropathy from L2 through the sacrum. No significant skeletal lesions. Review of the MIP images confirms the above findings. IMPRESSION: 1. Negative for acute pulmonary embolism. 2. Central ground-glass opacities in both lungs may represent alveolar edema 3. No acute findings in the abdomen or pelvis. 4. Small hiatal hernia. 5. Diverticulosis.  Electronically Signed   By: Ellery Plunk [name]   On: 09/11/2015 02:12   Ct Abdomen Pelvis W Contrast  09/11/2015  CLINICAL DATA:  Midchest pain.  Right lower quadrant pain. EXAM: CT ANGIOGRAPHY CHEST CT ABDOMEN AND PELVIS WITH CONTRAST TECHNIQUE: Multidetector CT imaging of the chest was performed using the standard protocol during bolus administration of intravenous contrast. Multiplanar CT image reconstructions and MIPs were obtained to evaluate the vascular anatomy. Multidetector CT imaging of the abdomen and pelvis was performed using the standard protocol during bolus administration of intravenous contrast. CONTRAST:  OMNIPAQUE IOHEXOL 350 MG/ML SOLN COMPARISON:  06/12/2014 FINDINGS: CTA CHEST FINDINGS Cardiovascular: There is good opacification of the pulmonary arteries. There is no pulmonary embolism. The thoracic aorta is normal in caliber and intact. Lungs: Mild ground-glass opacities, central predominant, possibly due to alveolar edema. No pulmonary masses or nodules. Central airways: Patent Effusions: None Lymphadenopathy: None Esophagus: Unremarkable Musculoskeletal: No significant abnormality. Moderate degenerative disc changes in the thoracic spine. CT ABDOMEN and PELVIS FINDINGS Hepatobiliary: Normal liver. Upper normal caliber of the extrahepatic biliary system. No intrahepatic bile duct dilatation. No biliary calculus or mass is evident. Gallbladder is unremarkable. Pancreas: Normal Spleen: Normal Adrenals/Urinary Tract: Benign cyst at the lateral aspect of the right kidney. Otherwise normal kidneys, ureters, and adrenals. Urinary bladder is empty but grossly unremarkable. Stomach/Bowel: Small hiatal hernia. Stomach and small bowel are otherwise unremarkable. Colon is remarkable only for moderate uncomplicated colonic diverticulosis. Prior appendectomy Vascular/Lymphatic: The abdominal aorta is normal in caliber. There is mild atherosclerotic calcification. There is no adenopathy in the  abdomen or pelvis. Reproductive: Hysterectomy.  No adnexal abnormalities. Other: No acute inflammatory changes are evident in the abdomen or pelvis.  There is no ascites. Musculoskeletal: Moderately severe degenerative lumbar disc disease. Grade 1 spondylolisthesis at L3-4 and L4-5. Facet arthropathy from L2 through the sacrum. No significant skeletal lesions. Review of the MIP images confirms the above findings. IMPRESSION: 1. Negative for acute pulmonary embolism. 2. Central ground-glass opacities in both lungs may represent alveolar edema 3. No acute findings in the abdomen or pelvis. 4. Small hiatal hernia. 5. Diverticulosis. Electronically Signed   By: Ellery Plunk [name]   On: 09/11/2015 02:12    EKG: Independently reviewed.  Assessment/Plan Principal Problem:   Chest pain Active Problems:   HTN (hypertension)   Rheumatoid arthritis (HCC)   Type 2 diabetes mellitus with other diabetic kidney complication (HCC)   1. Chest pain - 1. Chest pain obs pathway 2. Serial trops 3. Tele monitor 4. Per cards okay for patient to stay over here 5. CBC: HGB noted to be stable despite the bloody BMs that she was having yesterday. 6. I am suspicious that the chest pain may be secondary to her chronic cough that she has had trouble with for the past month now. 2. HTN - continue home meds 3. RA - will hold the humira dose she was due to get today given Tm of 99.4 in ED, and mild ground glass opacities on CT scan of chest as well as the cough she has had.  Suspicious that these ground glass opacities may be the cause of her cough, though I am unclear as to what exactly is causing them, (viral etiology perhaps?, patient dosent look sick enough for PJP and no hypoxia or SIRS) Continue prednisone, MTX later this week.  Will hold off on starting ABx for now. 4. DM2 - hold metformin while NPO for possible stress test    Code Status: Full  Family Communication: No family in room Disposition Plan: Admit  to inpatient   Time spent: 70 min  Mairin Lindsley M. [facility] Pager [phone]  If 7AM-7PM, please contact the day team taking care of the patient [email or web address] Password [email or web address] 09/11/2015, 4:59 AM

## 2015-09-11 NOTE — ED Notes (Signed)
MD at bedside. 

## 2015-09-11 NOTE — Care Management Obs Status (Signed)
MEDICARE OBSERVATION STATUS NOTIFICATION   Patient Details  Name: [name] [name] MRN: [id number] Date of Birth: 12-06-1929   Medicare Observation Status Notification Given:   yes    Geni Bers, RN 09/11/2015, 11:05 AM

## 2015-09-11 NOTE — Progress Notes (Signed)
Subjective: Patient admitted this morning, see detailed H&P by Dr. Julian Reil. 81 y.o. female with h/o RA, CAD. Patient presents to the ED with c/o gradual onset, constant, chest "pressure" that onset 1 day ago. Pain located in middle of chest, no radiation. She has been suffering from chronic cough that JUST got better over the past couple of days when she started taking tessalon perles. She is unsure if the cough is related to the chest pain. She also had a couple of BMs with blood in them yesterday that have spontaneously resolved. This morning patient denies any chest pain. CT chest shows groundglass opacities in both lungs which may represent alveolar edema.  Filed Vitals:   09/11/15 1336  BP: 129/78  Pulse: 91  Temp: 98.4 F (36.9 C)  Resp: 18    Chest: Clear Bilaterally Heart : S1S2 RRR Abdomen: Soft, nontender Ext : No edema Neuro: Alert, oriented x 3  A/P Pulmonary edema Chest pain Grade 1 diastolic dysfunction  Continue to cycle the cardiac enzymes Start Lasix 20 may grams IV every 12 hours Follow BMP in a.m.    Meredeth Ide [facility] [phone]

## 2015-09-11 NOTE — ED Notes (Signed)
Patient resting quietly, states she is feeling better, and just wants to sleep.

## 2015-09-12 DIAGNOSIS — I5042 Chronic combined systolic (congestive) and diastolic (congestive) heart failure: Secondary | ICD-10-CM

## 2015-09-12 DIAGNOSIS — R079 Chest pain, unspecified: Secondary | ICD-10-CM | POA: Diagnosis not present

## 2015-09-12 DIAGNOSIS — I5033 Acute on chronic diastolic (congestive) heart failure: Secondary | ICD-10-CM | POA: Diagnosis not present

## 2015-09-12 DIAGNOSIS — E1129 Type 2 diabetes mellitus with other diabetic kidney complication: Secondary | ICD-10-CM | POA: Diagnosis not present

## 2015-09-12 DIAGNOSIS — I10 Essential (primary) hypertension: Secondary | ICD-10-CM

## 2015-09-12 LAB — URINE CULTURE
Culture: NO GROWTH
Special Requests: NORMAL

## 2015-09-12 LAB — BASIC METABOLIC PANEL
Anion gap: 8 (ref 5–15)
BUN: 29 mg/dL — ABNORMAL HIGH (ref 6–20)
CO2: 25 mmol/L (ref 22–32)
Calcium: 9.1 mg/dL (ref 8.9–10.3)
Chloride: 103 mmol/L (ref 101–111)
Creatinine, Ser: 0.97 mg/dL (ref 0.44–1.00)
GFR calc Af Amer: >60 mL/min (ref >60)
GFR calc non Af Amer: 53 mL/min — ABNORMAL LOW (ref >60)
Glucose, Bld: 113 mg/dL — ABNORMAL HIGH (ref 65–99)
Potassium: 3.8 mmol/L (ref 3.5–5.1)
Sodium: 136 mmol/L (ref 135–145)

## 2015-09-12 LAB — GLUCOSE, CAPILLARY: Glucose-Capillary: 98 mg/dL (ref 65–99)

## 2015-09-12 MED ORDER — FUROSEMIDE 20 MG PO TABS: 20.0000 mg | ORAL_TABLET | Freq: Every day | ORAL | Status: DC

## 2015-09-12 NOTE — Discharge Summary (Signed)
Physician Discharge Summary  [name] [name] GYF:[id number] DOB: 1930/01/29 DOA: 09/10/2015  PCP: Kirt Boys, DO  Admit date: 09/10/2015 Discharge date: 09/12/2015  Time spent: 25 minutes  Recommendations for Outpatient Follow-up:  1. *Follow up PCP in one week to check BMP  Discharge Diagnoses:  Principal Problem:   Chest pain Active Problems:   HTN (hypertension)   Rheumatoid arthritis (HCC)   Type 2 diabetes mellitus with other diabetic kidney complication [facility])   Discharge Condition: Stable  Diet recommendation: low salt diet   Filed Weights   09/11/15 0638 09/12/15 0627  Weight: 80.922 kg (178 lb 6.4 oz) 80 kg (176 lb 5.9 oz)    History of present illness:  81 y.o. female with h/o RA, CAD. Patient presents to the ED with c/o gradual onset, constant, chest "pressure" that onset 1 day ago. Pain located in middle of chest, no radiation. She has been suffering from chronic cough that JUST got better over the past couple of days when she started taking tessalon perles. She is unsure if the cough is related to the chest pain. She also had a couple of BMs with blood in them yesterday that have spontaneously resolved. This morning patient denies any chest pain. CT chest shows groundglass opacities in both lungs which may represent alveolar edema.  Hospital Course:   Chest pain- resolved, cardiac enzymes x 3 negative. Likely from the acute diastolic heart failure.  Acute on chronic diastolic heart failure- patient's echocardiogram from 2015 showed grade 1 diastolic dysfunction, patient started on Lasix 20 g IV every 12 hours with good diuretic response. At this time patient is breathing much better. Will discharge on Lasix 20 g by mouth daily. She will need follow-up BMP in 1 week at PCP office.  Hypertension Continue home regimen of Catapres, labetalol, lisinopril.  Diabetes mellitus Continue metformin  Procedures:  None  Consultations:  None  Discharge  Exam: Filed Vitals:   09/12/15 0627  BP: 155/77  Pulse: 95  Temp: 98.1 F (36.7 C)  Resp: 20    General: Appears in no acute distress Cardiovascular: S1-S2 regular Respiratory: Clear to auscultation bilaterally  Discharge Instructions   Discharge Instructions    Diet - low sodium heart healthy    Complete by:  As directed      Discharge instructions    Complete by:  As directed   Check BMP in one week     Increase activity slowly    Complete by:  As directed           Current Discharge Medication List    START taking these medications   Details  furosemide (LASIX) 20 MG tablet Take 1 tablet (20 mg total) by mouth daily. Qty: 30 tablet, Refills: 0      CONTINUE these medications which have NOT CHANGED   Details  allopurinol (ZYLOPRIM) 100 MG tablet TAKE 1 TABLET BY MOUTH EVERY DAY FOR GOUT Qty: 30 tablet, Refills: 5    aspirin EC 81 MG tablet Take 81 mg by mouth daily.    benzonatate (TESSALON) 100 MG capsule take 2 capsules by mouth three times a day if needed for cough Qty: 30 capsule, Refills: 0    calcium-vitamin D (OSCAL WITH D) 500-200 MG-UNIT per tablet Take 1 tablet by mouth 2 (two) times daily.    Cholecalciferol (VITAMIN D-3) 1000 UNITS CAPS Take 1 capsule by mouth daily.    cloNIDine (CATAPRES) 0.1 MG tablet Take 0.1 mg by mouth daily as needed (blood presure  is above 160). For SBP>160    ferrous sulfate 325 (65 FE) MG tablet Take 325 mg by mouth 2 (two) times daily with a meal.    folic acid (FOLVITE) 1 MG tablet Take 2 mg by mouth daily.     HUMIRA PEN 40 MG/0.8ML PNKT Inject 1 each as directed every 14 (fourteen) days.    hydrALAZINE (APRESOLINE) 25 MG tablet Take 25 mg by mouth 2 (two) times daily.    labetalol (NORMODYNE) 100 MG tablet Take 2 tablets (200 mg total) by mouth 2 (two) times daily. Qty: 120 tablet, Refills: 5    lisinopril (PRINIVIL,ZESTRIL) 10 MG tablet Take 2 tablets by mouth daily for blood pressure Qty: 60 tablet,  Refills: 6    metFORMIN (GLUCOPHAGE) 500 MG tablet TAKE 1 TABLET BY MOUTH EVERY DAY FOR DIABETES Qty: 90 tablet, Refills: 1    methotrexate (RHEUMATREX) 2.5 MG tablet Provided by rheumatology- started on 4 tablets every week for 2 weeks, then 6 tab every week for 2 weeks, then 8 tab every week Qty: 12 tablet, Refills: 0    !! OVER THE COUNTER MEDICATION Take 1 tablet by mouth 2 (two) times daily. Wellness formula for immune support    !! OVER THE COUNTER MEDICATION Take 5 mLs by mouth every morning. Chloraphil liquid, mix in apply juice    oxycodone (OXY-IR) 5 MG capsule Take 1 capsule (5 mg total) by mouth every 4 (four) hours as needed for pain (Arthritis pain). Qty: 30 capsule, Refills: 0    polyvinyl alcohol (LIQUIFILM TEARS) 1.4 % ophthalmic solution Place 1 drop into both eyes as needed (for dry eyes).    predniSONE (DELTASONE) 1 MG tablet Take 1 mg by mouth daily with breakfast.     traMADol (ULTRAM) 50 MG tablet take 1-2 tablets by mouth every 6 hours for MODERATE pain Qty: 120 tablet, Refills: 0    triamcinolone cream (KENALOG) 0.1 % APPLY TO AFFECTED AREA TWICE DAILY AS NEEDED FOR IRRITATION Qty: 30 g, Refills: 2    vitamin C (ASCORBIC ACID) 500 MG tablet Take 500 mg by mouth daily.    glucose blood (ONE TOUCH ULTRA TEST) test strip Check blood sugar once daily DX 250.00 Qty: 100 each, Refills: 3     !! - Potential duplicate medications found. Please discuss with provider.     Allergies  Allergen Reactions  . Cetirizine Hcl Other (See Comments)    unknown  . Codeine Other (See Comments)    unknown  . Fish Allergy   . Indomethacin Other (See Comments)    unknown  . Lyrica [Pregabalin]     hallucinations  . Methyldopa Other (See Comments)    unknown  . Shellfish Allergy Hives  . Tomato Rash      The results of significant diagnostics from this hospitalization (including imaging, microbiology, ancillary and laboratory) are listed below for reference.     Significant Diagnostic Studies: Dg Chest 2 View  09/10/2015  CLINICAL DATA:  Fever and body aches for 24 hours. Upper chest pain. EXAM: CHEST  2 VIEW COMPARISON:  11/21/2014 FINDINGS: There is moderate cardiomegaly and aortic tortuosity, unchanged. No airspace consolidation. No effusions. Pulmonary vasculature is normal. Severe chronic shoulder arthropathy bilaterally. IMPRESSION: Cardiomegaly.  No acute cardiopulmonary findings. Electronically Signed   By: Ellery Plunk [name]   On: 09/10/2015 23:29   Ct Angio Chest Pe W/cm &/or Wo Cm  09/11/2015  CLINICAL DATA:  Midchest pain.  Right lower quadrant pain. EXAM: CT ANGIOGRAPHY CHEST  CT ABDOMEN AND PELVIS WITH CONTRAST TECHNIQUE: Multidetector CT imaging of the chest was performed using the standard protocol during bolus administration of intravenous contrast. Multiplanar CT image reconstructions and MIPs were obtained to evaluate the vascular anatomy. Multidetector CT imaging of the abdomen and pelvis was performed using the standard protocol during bolus administration of intravenous contrast. CONTRAST:  OMNIPAQUE IOHEXOL 350 MG/ML SOLN COMPARISON:  06/12/2014 FINDINGS: CTA CHEST FINDINGS Cardiovascular: There is good opacification of the pulmonary arteries. There is no pulmonary embolism. The thoracic aorta is normal in caliber and intact. Lungs: Mild ground-glass opacities, central predominant, possibly due to alveolar edema. No pulmonary masses or nodules. Central airways: Patent Effusions: None Lymphadenopathy: None Esophagus: Unremarkable Musculoskeletal: No significant abnormality. Moderate degenerative disc changes in the thoracic spine. CT ABDOMEN and PELVIS FINDINGS Hepatobiliary: Normal liver. Upper normal caliber of the extrahepatic biliary system. No intrahepatic bile duct dilatation. No biliary calculus or mass is evident. Gallbladder is unremarkable. Pancreas: Normal Spleen: Normal Adrenals/Urinary Tract: Benign cyst at the lateral  aspect of the right kidney. Otherwise normal kidneys, ureters, and adrenals. Urinary bladder is empty but grossly unremarkable. Stomach/Bowel: Small hiatal hernia. Stomach and small bowel are otherwise unremarkable. Colon is remarkable only for moderate uncomplicated colonic diverticulosis. Prior appendectomy Vascular/Lymphatic: The abdominal aorta is normal in caliber. There is mild atherosclerotic calcification. There is no adenopathy in the abdomen or pelvis. Reproductive: Hysterectomy.  No adnexal abnormalities. Other: No acute inflammatory changes are evident in the abdomen or pelvis. There is no ascites. Musculoskeletal: Moderately severe degenerative lumbar disc disease. Grade 1 spondylolisthesis at L3-4 and L4-5. Facet arthropathy from L2 through the sacrum. No significant skeletal lesions. Review of the MIP images confirms the above findings. IMPRESSION: 1. Negative for acute pulmonary embolism. 2. Central ground-glass opacities in both lungs may represent alveolar edema 3. No acute findings in the abdomen or pelvis. 4. Small hiatal hernia. 5. Diverticulosis. Electronically Signed   By: Ellery Plunk [name]   On: 09/11/2015 02:12   Ct Abdomen Pelvis W Contrast  09/11/2015  CLINICAL DATA:  Midchest pain.  Right lower quadrant pain. EXAM: CT ANGIOGRAPHY CHEST CT ABDOMEN AND PELVIS WITH CONTRAST TECHNIQUE: Multidetector CT imaging of the chest was performed using the standard protocol during bolus administration of intravenous contrast. Multiplanar CT image reconstructions and MIPs were obtained to evaluate the vascular anatomy. Multidetector CT imaging of the abdomen and pelvis was performed using the standard protocol during bolus administration of intravenous contrast. CONTRAST:  OMNIPAQUE IOHEXOL 350 MG/ML SOLN COMPARISON:  06/12/2014 FINDINGS: CTA CHEST FINDINGS Cardiovascular: There is good opacification of the pulmonary arteries. There is no pulmonary embolism. The thoracic aorta is normal in  caliber and intact. Lungs: Mild ground-glass opacities, central predominant, possibly due to alveolar edema. No pulmonary masses or nodules. Central airways: Patent Effusions: None Lymphadenopathy: None Esophagus: Unremarkable Musculoskeletal: No significant abnormality. Moderate degenerative disc changes in the thoracic spine. CT ABDOMEN and PELVIS FINDINGS Hepatobiliary: Normal liver. Upper normal caliber of the extrahepatic biliary system. No intrahepatic bile duct dilatation. No biliary calculus or mass is evident. Gallbladder is unremarkable. Pancreas: Normal Spleen: Normal Adrenals/Urinary Tract: Benign cyst at the lateral aspect of the right kidney. Otherwise normal kidneys, ureters, and adrenals. Urinary bladder is empty but grossly unremarkable. Stomach/Bowel: Small hiatal hernia. Stomach and small bowel are otherwise unremarkable. Colon is remarkable only for moderate uncomplicated colonic diverticulosis. Prior appendectomy Vascular/Lymphatic: The abdominal aorta is normal in caliber. There is mild atherosclerotic calcification. There is no adenopathy in the  abdomen or pelvis. Reproductive: Hysterectomy.  No adnexal abnormalities. Other: No acute inflammatory changes are evident in the abdomen or pelvis. There is no ascites. Musculoskeletal: Moderately severe degenerative lumbar disc disease. Grade 1 spondylolisthesis at L3-4 and L4-5. Facet arthropathy from L2 through the sacrum. No significant skeletal lesions. Review of the MIP images confirms the above findings. IMPRESSION: 1. Negative for acute pulmonary embolism. 2. Central ground-glass opacities in both lungs may represent alveolar edema 3. No acute findings in the abdomen or pelvis. 4. Small hiatal hernia. 5. Diverticulosis. Electronically Signed   By: Ellery Plunk [name]   On: 09/11/2015 02:12    Microbiology: Recent Results (from the past 240 hour(s))  Urine culture     Status: None   Collection Time: 09/11/15  1:35 AM  Result Value Ref  Range Status   Specimen Description URINE, CATHETERIZED  Final   Special Requests Normal  Final   Culture   Final    NO GROWTH 1 DAY Performed at [facility]    Report Status 09/12/2015 FINAL  Final     Labs: Basic Metabolic Panel:  Recent Labs Lab 09/09/15 09/09/15 1210 09/11/15 0035 09/11/15 0047 09/12/15 0433  NA 134* 134* 134* 134* 136  K 4.4 4.4 4.1 4.1 3.8  CL  --  102 103 100* 103  CO2  --  24 24  --  25  GLUCOSE  --  108* 105* 106* 113*  BUN 14 14 20  21* 29*  CREATININE 0.7 0.73 0.96 1.10* 0.97  CALCIUM  --  9.4 9.3  --  9.1   Liver Function Tests:  Recent Labs Lab 09/09/15 09/09/15 1210 09/11/15 0035  AST 17 17 16   ALT 11 11 11*  ALKPHOS 71 71 58  BILITOT  --  0.5 0.6  PROT  --  7.2 7.4  ALBUMIN  --  3.9 3.6    Recent Labs Lab 09/11/15 0035  LIPASE 30   No results for input(s): AMMONIA in the last 168 hours. CBC:  Recent Labs Lab 09/09/15 09/09/15 1210 09/11/15 0035 09/11/15 0047  WBC 7.6 7.6 8.5  --   NEUTROABS 5 4.6  --   --   HGB 10.0*  --  9.3* 10.5*  HCT 28* 28.0* 27.4* 31.0*  MCV  --   --  91.9  --   PLT 340  --  342  --    Cardiac Enzymes:  Recent Labs Lab 09/11/15 0508 09/11/15 1117 09/11/15 1640  TROPONINI <0.03 <0.03 <0.03   BNP: BNP (last 3 results)  Recent Labs  09/11/15 0035  BNP 336.8*    ProBNP (last 3 results) No results for input(s): PROBNP in the last 8760 hours.  CBG:  Recent Labs Lab 09/11/15 0750 09/11/15 1205 09/11/15 1634 09/11/15 2105 09/12/15 0731  GLUCAP 88 109* 117* 93 98       Signed:  Lisha Vitale S  [facility] 09/12/2015, 8:45 AM

## 2015-09-16 ENCOUNTER — Telehealth: Payer: Self-pay | Admitting: *Deleted

## 2015-09-16 NOTE — Telephone Encounter (Signed)
Patient daughter, Para March called and wants hospital records faxed to Erskine Squibb at the [employer] [place] [employer] from this past hospital stay. Stated its for patient to get more nurse hours approved. Has a hearing on Wednesday. Wants faxed to fax#: [phone] [facility] [place].  Patient daughter is going to call [facility] and have them fax for her. Will keep appointment on 09/19/15

## 2015-09-19 ENCOUNTER — Ambulatory Visit (INDEPENDENT_AMBULATORY_CARE_PROVIDER_SITE_OTHER): Payer: Medicare Other | Admitting: Nurse Practitioner

## 2015-09-19 ENCOUNTER — Encounter: Payer: Self-pay | Admitting: Nurse Practitioner

## 2015-09-19 VITALS — BP 160/80 | HR 102 | Temp 97.8°F | Resp 20 | Ht 64.0 in | Wt 183.0 lb

## 2015-09-19 DIAGNOSIS — I10 Essential (primary) hypertension: Secondary | ICD-10-CM | POA: Diagnosis not present

## 2015-09-19 DIAGNOSIS — M1990 Unspecified osteoarthritis, unspecified site: Secondary | ICD-10-CM | POA: Diagnosis not present

## 2015-09-19 DIAGNOSIS — N183 Chronic kidney disease, stage 3 unspecified: Secondary | ICD-10-CM

## 2015-09-19 DIAGNOSIS — I5033 Acute on chronic diastolic (congestive) heart failure: Secondary | ICD-10-CM | POA: Diagnosis not present

## 2015-09-19 MED ORDER — OXYCODONE HCL 5 MG PO CAPS: 5.0000 mg | ORAL_CAPSULE | ORAL | Status: DC | PRN

## 2015-09-19 MED ORDER — TRAMADOL HCL 50 MG PO TABS: ORAL_TABLET | ORAL | Status: DC

## 2015-09-19 NOTE — Progress Notes (Signed)
Patient ID: [name] [name], female   DOB: 03-Nov-1930, 81 y.o.   MRN: [id number]    PCP: Kirt Boys, DO  Advanced Directive information Does patient have an advance directive?: Yes, Type of Advance Directive: Healthcare Power of Attorney  Allergies  Allergen Reactions  . Cetirizine Hcl Other (See Comments)    unknown  . Codeine Other (See Comments)    unknown  . Fish Allergy   . Indomethacin Other (See Comments)    unknown  . Lyrica [Pregabalin]     hallucinations  . Methyldopa Other (See Comments)    unknown  . Shellfish Allergy Hives  . Tomato Rash    Chief Complaint  Patient presents with  . Hospitalization Follow-up    F/u chest pain     HPI: Patient is a 81 y.o. female seen in the office today to follow up hospitalization. Pt with history of RA, CAD, CHF.  pt went to the hospital with chest pains and was diagnosed and treated for acute CHF. Chest pain  resolved, cardiac enzymes x 3 negative and thought to be from the acute diastolic heart failure. [name] echocardiogram from 2015 showed grade 1 diastolic dysfunction, pt was diuresed with IV lasix and changed to Lasix 20 mg by mouth daily on discharge.  Denies cough, congestion, no chest pains or tightness. No LE edema.  Has never been able to lay flat, has hospital bed and elevates head and uses 2 pillows chronically.  Pt weighting herself at home and maintaining between 163-165 since hospitalization. Weighting first thing in the morning with her nightgown on.   Review of Systems:  Review of Systems  Constitutional: Negative for activity change, appetite change, fatigue and unexpected weight change.  HENT: Negative for congestion and hearing loss.   Eyes: Negative.   Respiratory: Negative for cough and shortness of breath.   Cardiovascular: Negative for chest pain, palpitations and leg swelling.  Gastrointestinal: Negative for abdominal pain, diarrhea and constipation.  Genitourinary: Negative for dysuria and  difficulty urinating.  Musculoskeletal: Positive for myalgias and arthralgias.       Pain controlled on current regimen  Skin: Negative for color change and wound.  Neurological: Positive for weakness (slowly improving). Negative for dizziness.    Past Medical History  Diagnosis Date  . Hypertension   . First degree atrioventricular block   . Onychia and paronychia of toe   . Muscle weakness (generalized)   . Allergic rhinitis due to pollen 04/27/2007  . Gout, unspecified 04/19/2013  . Disorder of bone and cartilage, unspecified   . Edema 05/03/2013  . Hyperlipidemia 04/27/2007  . Unspecified essential hypertension 08/08/2013  . Coronary atherosclerosis of native coronary artery   . Other malaise and fatigue   . Cough   . Other fall   . Unspecified vitamin D deficiency   . TIA (transient ischemic attack)     "a few one summer" (08/07/2013)  . Myocardial infarction (HCC) ~ 1970  . Exertional shortness of breath     "sometimes" (08/07/2013)  . Type II diabetes mellitus (HCC)     "fasting 90-110s" (08/07/2013)  . Osteoarthrosis, unspecified whether generalized or localized, unspecified site 04/27/2007  . Prepatellar bursitis   . Arthritis     "in q joint" (08/07/2013)  . Stroke (HCC) 01/17/2014  . Seizures (HCC)   . Muscle spasm    Past Surgical History  Procedure Laterality Date  . Tonsillectomy  08/1974  . Replacement total knee Right 09/2005  . Shoulder open rotator cuff  repair Right 07/1999  . Appendectomy  1960  . Abdominal hysterectomy  04/1980  . Transthoracic echocardiogram  2003    EF 55-65%; mild concentric LVH  . Joint replacement    . Total knee arthroplasty Left 08/07/2013  . Cataract extraction w/ intraocular lens  implant, bilateral Bilateral ~ 2012  . Total knee arthroplasty Left 08/07/2013    Procedure: TOTAL KNEE ARTHROPLASTY- LEFT;  Surgeon: Dannielle Huh, MD;  Location: [facility] OR;  Service: Orthopedics;  Laterality: Left;  . Cardiac catheterization  2003    Social History:   reports that she has never smoked. She has never used smokeless tobacco. She reports that she does not drink alcohol or use illicit drugs.  Family History  Problem Relation Age of Onset  . Heart attack Father     Medications: [name] Medications  New Prescriptions   No medications on file  Previous Medications   ALLOPURINOL (ZYLOPRIM) 100 MG TABLET    TAKE 1 TABLET BY MOUTH EVERY DAY FOR GOUT   ASPIRIN EC 81 MG TABLET    Take 81 mg by mouth daily.   BENZONATATE (TESSALON) 100 MG CAPSULE    take 2 capsules by mouth three times a day if needed for cough   CALCIUM-VITAMIN D (OSCAL WITH D) 500-200 MG-UNIT PER TABLET    Take 1 tablet by mouth 2 (two) times daily.   CHOLECALCIFEROL (VITAMIN D-3) 1000 UNITS CAPS    Take 1 capsule by mouth daily.   CLONIDINE (CATAPRES) 0.1 MG TABLET    Take 0.1 mg by mouth daily as needed (blood presure is above 160). For SBP>160   FERROUS SULFATE 325 (65 FE) MG TABLET    Take 325 mg by mouth 2 (two) times daily with a meal.   FOLIC ACID (FOLVITE) 1 MG TABLET    Take 2 mg by mouth daily.    FUROSEMIDE (LASIX) 20 MG TABLET    Take 1 tablet (20 mg total) by mouth daily.   GLUCOSE BLOOD (ONE TOUCH ULTRA TEST) TEST STRIP    Check blood sugar once daily DX 250.00   HUMIRA PEN 40 MG/0.8ML PNKT    Inject 1 each as directed every 14 (fourteen) days.   HYDRALAZINE (APRESOLINE) 25 MG TABLET    Take 25 mg by mouth 2 (two) times daily.   LABETALOL (NORMODYNE) 100 MG TABLET    Take 2 tablets (200 mg total) by mouth 2 (two) times daily.   LISINOPRIL (PRINIVIL,ZESTRIL) 10 MG TABLET    Take 2 tablets by mouth daily for blood pressure   METFORMIN (GLUCOPHAGE) 500 MG TABLET    TAKE 1 TABLET BY MOUTH EVERY DAY FOR DIABETES   METHOTREXATE (RHEUMATREX) 2.5 MG TABLET    Provided by rheumatology- started on 4 tablets every week for 2 weeks, then 6 tab every week for 2 weeks, then 8 tab every week   OVER THE COUNTER MEDICATION    Take 1 tablet by mouth 2 (two)  times daily. Wellness formula for immune support   OVER THE COUNTER MEDICATION    Take 5 mLs by mouth every morning. Chloraphil liquid, mix in apply juice   OXYCODONE (OXY-IR) 5 MG CAPSULE    Take 1 capsule (5 mg total) by mouth every 4 (four) hours as needed for pain (Arthritis pain).   POLYVINYL ALCOHOL (LIQUIFILM TEARS) 1.4 % OPHTHALMIC SOLUTION    Place 1 drop into both eyes as needed (for dry eyes).   PREDNISONE (DELTASONE) 1 MG TABLET    Take  1 mg by mouth daily with breakfast.    TRAMADOL (ULTRAM) 50 MG TABLET    take 1-2 tablets by mouth every 6 hours for MODERATE pain   TRIAMCINOLONE CREAM (KENALOG) 0.1 %    APPLY TO AFFECTED AREA TWICE DAILY AS NEEDED FOR IRRITATION   VITAMIN C (ASCORBIC ACID) 500 MG TABLET    Take 500 mg by mouth daily.  Modified Medications   No medications on file  Discontinued Medications   No medications on file     Physical Exam:  Filed Vitals:   09/19/15 1121  BP: 150/80  Pulse: 102  Temp: 97.8 F (36.6 C)  TempSrc: Oral  Resp: 20  Height: 5\' 4"  (1.626 m)  Weight: 183 lb (83.008 kg)  SpO2: 98%   Body mass index is 31.4 kg/(m^2).  Physical Exam  Constitutional: She is oriented to person, place, and time. She appears well-developed and well-nourished. No distress.  Elderly female  HENT:  Head: Normocephalic and atraumatic.  Mouth/Throat: Oropharynx is clear and moist. No oropharyngeal exudate.  Eyes: Conjunctivae are normal. Pupils are equal, round, and reactive to light.  Neck: Normal range of motion. Neck supple.  Cardiovascular: Normal rate, regular rhythm and normal heart sounds.   Pulmonary/Chest: Effort normal and breath sounds normal.  Abdominal: Soft. Bowel sounds are normal. She exhibits no distension.  Musculoskeletal: She exhibits no edema.  Arthritic changes to hands bilaterally  Neurological: She is alert and oriented to person, place, and time.  Skin: Skin is warm and dry.  Psychiatric: She has a normal mood and affect.     Labs reviewed: Basic Metabolic Panel:  Recent Labs  1438  09/09/15 1210 09/11/15 0035 09/11/15 0047 09/12/15 0433  NA 137  < > 134* 134* 134* 136  K 4.9  < > 4.4 4.1 4.1 3.8  CL 101  --  102 103 100* 103  CO2 20  --  24 24  --  25  GLUCOSE 105*  --  108* 105* 106* 113*  BUN 20  < > 14 20 21* 29*  CREATININE 0.91  < > 0.73 0.96 1.10* 0.97  CALCIUM 9.8  --  9.4 9.3  --  9.1  TSH 1.420  --   --   --   --   --   < > = values in this interval not displayed. Liver Function Tests:  Recent Labs  07/23/15 1438  09/09/15 09/09/15 1210 09/11/15 0035  AST 16  < > 17 17 16   ALT 7  < > 11 11 11*  ALKPHOS 65  < > 71 71 58  BILITOT 0.3  --   --  0.5 0.6  PROT 7.2  --   --  7.2 7.4  ALBUMIN 3.9  --   --  3.9 3.6  < > = values in this interval not displayed.  Recent Labs  09/11/15 0035  LIPASE 30   No results for input(s): AMMONIA in the last 8760 hours. CBC:  Recent Labs  06/20/15 0440 06/21/15 0428  07/23/15 1438 08/09/15 09/09/15 09/09/15 1210 09/11/15 0035 09/11/15 0047  WBC 11.9* 11.9*  < > 8.7 7.8 7.6 7.6 8.5  --   NEUTROABS  --   --   --  5.4  --  5 4.6  --   --   HGB 10.2* 10.2*  --   --  9.8* 10.0*  --  9.3* 10.5*  HCT 31.0* 31.3*  < > 30.3* 29* 28* 28.0* 27.4* 31.0*  MCV 93.1 93.2  --   --   --   --   --  91.9  --   PLT 321 299  --   --  350 340  --  342  --   < > = values in this interval not displayed. Lipid Panel:  Recent Labs  07/03/15 1450  CHOL 180  HDL 72  LDLCALC 95  TRIG 65  CHOLHDL 2.5   TSH:  Recent Labs  07/23/15 1438  TSH 1.420   A1C: Lab Results  Component Value Date   HGBA1C 6.7* 07/03/2015     Assessment/Plan 1. Acute on chronic diastolic heart failure (HCC) Weight remains stable based on home weights, no worsening shortness of breath or chest pains. No LE edema.  -conts on lasix 20 mg daily  - Basic metabolic panel  2. Essential hypertension -blood pressure elevated today, reviewed chart and noted  increase in lisinopril to 30 mg daily however pt has been taking 20 mg daily -will change at this time -to call if SBP remaining over 150 -to cont apresoline, labetalol, lasix, clonidine as needed   3. Osteoarthritis, unspecified osteoarthritis type, unspecified site -stable on current regimen, refills provided  - oxycodone (OXY-IR) 5 MG capsule; Take 1 capsule (5 mg total) by mouth every 4 (four) hours as needed for pain (Arthritis pain).  Dispense: 30 capsule; Refill: 0 - traMADol (ULTRAM) 50 MG tablet; take 1-2 tablets by mouth every 6 hours for MODERATE pain  Dispense: 120 tablet; Refill: 0  4. CKD (chronic kidney disease) stage 3, GFR 30-59 ml/min -will follow up bmp now that on lasix, avoiding NSAIDs.     Janene Harvey. Biagio Borg  [facility] [phone] 8 am - 5 pm) [phone] (after hours)

## 2015-09-20 ENCOUNTER — Other Ambulatory Visit: Payer: Self-pay | Admitting: Nurse Practitioner

## 2015-09-20 LAB — BASIC METABOLIC PANEL
BUN/Creatinine Ratio: 17 (ref 11–26)
BUN: 15 mg/dL (ref 8–27)
CO2: 21 mmol/L (ref 18–29)
Calcium: 9.7 mg/dL (ref 8.7–10.3)
Chloride: 99 mmol/L (ref 97–106)
Creatinine, Ser: 0.86 mg/dL (ref 0.57–1.00)
GFR calc Af Amer: 72 mL/min/{1.73_m2} (ref >59)
GFR calc non Af Amer: 63 mL/min/{1.73_m2} (ref >59)
Glucose: 86 mg/dL (ref 65–99)
Potassium: 4.1 mmol/L (ref 3.5–5.2)
Sodium: 136 mmol/L (ref 136–144)

## 2015-09-20 MED ORDER — FUROSEMIDE 20 MG PO TABS: 20.0000 mg | ORAL_TABLET | Freq: Every day | ORAL | Status: DC

## 2015-09-20 MED ORDER — LISINOPRIL 30 MG PO TABS: 30.0000 mg | ORAL_TABLET | Freq: Every day | ORAL | Status: DC

## 2015-09-30 ENCOUNTER — Ambulatory Visit: Payer: Medicare Other | Admitting: Podiatry

## 2015-10-07 ENCOUNTER — Other Ambulatory Visit: Payer: Self-pay | Admitting: Internal Medicine

## 2015-10-09 ENCOUNTER — Ambulatory Visit (INDEPENDENT_AMBULATORY_CARE_PROVIDER_SITE_OTHER): Payer: Medicare Other | Admitting: Internal Medicine

## 2015-10-09 ENCOUNTER — Encounter: Payer: Self-pay | Admitting: Internal Medicine

## 2015-10-09 VITALS — BP 140/88 | HR 93 | Temp 98.1°F | Resp 20 | Ht 64.0 in | Wt 177.0 lb

## 2015-10-09 DIAGNOSIS — I5032 Chronic diastolic (congestive) heart failure: Secondary | ICD-10-CM

## 2015-10-09 DIAGNOSIS — M069 Rheumatoid arthritis, unspecified: Secondary | ICD-10-CM | POA: Diagnosis not present

## 2015-10-09 DIAGNOSIS — E1129 Type 2 diabetes mellitus with other diabetic kidney complication: Secondary | ICD-10-CM | POA: Diagnosis not present

## 2015-10-09 DIAGNOSIS — I10 Essential (primary) hypertension: Secondary | ICD-10-CM

## 2015-10-09 DIAGNOSIS — M1000 Idiopathic gout, unspecified site: Secondary | ICD-10-CM | POA: Diagnosis not present

## 2015-10-09 NOTE — Patient Instructions (Signed)
Continue current medications as ordered  Follow up in April 2017 for CPE/medicare  Follow up with specialists as scheduled  Will call with cardiology appt

## 2015-10-09 NOTE — Progress Notes (Signed)
Patient ID: [name] [name], female   DOB: November 14, 1929, 81 y.o.   MRN: [id number]    Location:    [facility]   Place of Service:  OFFICE   Chief Complaint  Patient presents with  . Medical Management of Chronic Issues    3 month follow-up for Hypeertension, CKD    HPI:  81 yo female seen today for f/u. She feels well overall. Has seasonal allergy congestion with scratchy throat. She is taking allergy med as ordered  CHF - she was recently in hospital for A/C diastolic HF. Lasix adjusted. She does not f/u with cardio  She continues to take daily prednisone and weekly humira and MTX. She is mx by Rheum Dr Estanislado Pandy. Pain controlled on norco. She has hand pain today. She has AM stiffness lasting 4 hrs.  BP controlled on lisinopril, labetalol, hydralazine, clonidine. Readings fluctuate at home 150-180s/80-90s, occasional SBP 140s  BS 110-130s on metformin. No low BS reactions.   No gout attacks. She takes allopurinol  Past Medical History  Diagnosis Date  . Hypertension   . First degree atrioventricular block   . Onychia and paronychia of toe   . Muscle weakness (generalized)   . Allergic rhinitis due to pollen 04/27/2007  . Gout, unspecified 04/19/2013  . Disorder of bone and cartilage, unspecified   . Edema 05/03/2013  . Hyperlipidemia 04/27/2007  . Unspecified essential hypertension 08/08/2013  . Coronary atherosclerosis of native coronary artery   . Other malaise and fatigue   . Cough   . Other fall   . Unspecified vitamin D deficiency   . TIA (transient ischemic attack)     "a few one summer" (08/07/2013)  . Myocardial infarction ([place]) ~ 1970  . Exertional shortness of breath     "sometimes" (08/07/2013)  . Type II diabetes mellitus ([place])     "fasting 90-110s" (08/07/2013)  . Osteoarthrosis, unspecified whether generalized or localized, unspecified site 04/27/2007  . Prepatellar bursitis   . Arthritis     "in q joint" (08/07/2013)  . Stroke ([place]) 01/17/2014  . Seizures  ([place])   . Muscle spasm     Past Surgical History  Procedure Laterality Date  . Tonsillectomy  08/1974  . Replacement total knee Right 09/2005  . Shoulder open rotator cuff repair Right 07/1999  . Appendectomy  1960  . Abdominal hysterectomy  04/1980  . Transthoracic echocardiogram  2003    EF 55-65%; mild concentric LVH  . Joint replacement    . Total knee arthroplasty Left 08/07/2013  . Cataract extraction w/ intraocular lens  implant, bilateral Bilateral ~ 2012  . Total knee arthroplasty Left 08/07/2013    Procedure: TOTAL KNEE ARTHROPLASTY- LEFT;  Surgeon: Vickey Huger, MD;  Location: [facility];  Service: Orthopedics;  Laterality: Left;  . Cardiac catheterization  2003    Patient Care Team: Gildardo Cranker, DO as PCP - General (Internal Medicine)  Social History   Social History  . Marital Status: Widowed    Spouse Name: N/A  . Number of Children: N/A  . Years of Education: N/A   Occupational History  . Not on file.   Social History Main Topics  . Smoking status: Never Smoker   . Smokeless tobacco: Never Used  . Alcohol Use: No  . Drug Use: No  . Sexual Activity: No   Other Topics Concern  . Not on file   Social History Narrative   Widowed   Walks with cane     reports that she  has never smoked. She has never used smokeless tobacco. She reports that she does not drink alcohol or use illicit drugs.  Allergies  Allergen Reactions  . Cetirizine Hcl Other (See Comments)    unknown  . Codeine Other (See Comments)    unknown  . Fish Allergy   . Indomethacin Other (See Comments)    unknown  . Lyrica [Pregabalin]     hallucinations  . Methyldopa Other (See Comments)    unknown  . Shellfish Allergy Hives  . Tomato Rash    Medications: [name] Medications  New Prescriptions   No medications on file  Previous Medications   ALLOPURINOL (ZYLOPRIM) 100 MG TABLET    TAKE 1 TABLET BY MOUTH EVERY DAY FOR GOUT   ASPIRIN EC 81 MG TABLET    Take 81 mg by mouth daily.     BENZONATATE (TESSALON) 100 MG CAPSULE    take 2 capsules by mouth three times a day if needed for cough   CALCIUM-VITAMIN D (OSCAL WITH D) 500-200 MG-UNIT PER TABLET    Take 1 tablet by mouth 2 (two) times daily.   CHOLECALCIFEROL (VITAMIN D-3) 1000 UNITS CAPS    Take 1 capsule by mouth daily.   CLONIDINE (CATAPRES) 0.1 MG TABLET    Take 0.1 mg by mouth daily as needed (blood presure is above 160). For SBP>160   FERROUS SULFATE 325 (65 FE) MG TABLET    Take 325 mg by mouth 2 (two) times daily with a meal.   FOLIC ACID (FOLVITE) 1 MG TABLET    Take 2 mg by mouth daily.    FUROSEMIDE (LASIX) 20 MG TABLET    Take 1 tablet (20 mg total) by mouth daily.   GLUCOSE BLOOD (ONE TOUCH ULTRA TEST) TEST STRIP    Check blood sugar once daily DX 250.00   HUMIRA PEN 40 MG/0.8ML PNKT    Inject 1 each as directed every 14 (fourteen) days.   HYDRALAZINE (APRESOLINE) 25 MG TABLET    Take 25 mg by mouth 2 (two) times daily.   LABETALOL (NORMODYNE) 100 MG TABLET    Take 2 tablets (200 mg total) by mouth 2 (two) times daily.   LISINOPRIL (PRINIVIL,ZESTRIL) 30 MG TABLET    Take 1 tablet (30 mg total) by mouth daily.   METFORMIN (GLUCOPHAGE) 500 MG TABLET    TAKE 1 TABLET BY MOUTH EVERY DAY FOR DIABETES   METHOTREXATE (RHEUMATREX) 2.5 MG TABLET    Provided by rheumatology- started on 4 tablets every week for 2 weeks, then 6 tab every week for 2 weeks, then 8 tab every week   OVER THE COUNTER MEDICATION    Take 1 tablet by mouth 2 (two) times daily. Wellness formula for immune support   OVER THE COUNTER MEDICATION    Take 5 mLs by mouth every morning. Chloraphil liquid, mix in apply juice   OXYCODONE (OXY-IR) 5 MG CAPSULE    Take 1 capsule (5 mg total) by mouth every 4 (four) hours as needed for pain (Arthritis pain).   POLYVINYL ALCOHOL (LIQUIFILM TEARS) 1.4 % OPHTHALMIC SOLUTION    Place 1 drop into both eyes as needed (for dry eyes).   PREDNISONE (DELTASONE) 1 MG TABLET    Take 1 mg by mouth daily with breakfast.     TRAMADOL (ULTRAM) 50 MG TABLET    take 1-2 tablets by mouth every 6 hours for MODERATE pain   TRIAMCINOLONE CREAM (KENALOG) 0.1 %    APPLY TO AFFECTED AREA TWICE  DAILY AS NEEDED FOR IRRITATION   VITAMIN C (ASCORBIC ACID) 500 MG TABLET    Take 500 mg by mouth daily.  Modified Medications   No medications on file  Discontinued Medications   No medications on file    Review of Systems  Constitutional: Negative for fever, chills, diaphoresis, activity change, appetite change and fatigue.  HENT: Positive for congestion, postnasal drip and sinus pressure. Negative for ear pain and sore throat.   Eyes: Negative for visual disturbance.  Respiratory: Negative for cough, chest tightness and shortness of breath.   Cardiovascular: Negative for chest pain, palpitations and leg swelling.  Gastrointestinal: Negative for nausea, vomiting, abdominal pain, diarrhea, constipation and blood in stool.  Genitourinary: Negative for dysuria.  Musculoskeletal: Positive for back pain, joint swelling, arthralgias and gait problem (using cane).  Neurological: Negative for dizziness, tremors, numbness and headaches.  Psychiatric/Behavioral: Negative for sleep disturbance. The patient is not nervous/anxious.     Filed Vitals:   10/09/15 1150  Pulse: 93  Temp: 98.1 F (36.7 C)  TempSrc: Oral  Resp: 20  Height: 5' 4" (1.626 m)  Weight: 177 lb (80.287 kg)  SpO2: 97%   Body mass index is 30.37 kg/(m^2).  Physical Exam  Constitutional: She is oriented to person, place, and time. She appears well-developed and well-nourished. No distress.  Looks well in NAD. Granddaughter, Erline Levine, present  HENT:  Mouth/Throat: Oropharynx is clear and moist. No oropharyngeal exudate.  Eyes: Pupils are equal, round, and reactive to light. No scleral icterus.  Neck: Neck supple. Carotid bruit is not present. No tracheal deviation present. No thyromegaly present.  Cardiovascular: Normal rate, regular rhythm and intact distal  pulses.  Exam reveals no gallop, no distant heart sounds and no friction rub.   Murmur heard.  Systolic murmur is present with a grade of 1/6  Trace LE edema b/l. No calf TTP  Pulmonary/Chest: Effort normal and breath sounds normal. No stridor. No respiratory distress. She has no wheezes. She has no rales.  Abdominal: Soft. Bowel sounds are normal. She exhibits no distension and no mass. There is no hepatomegaly. There is no tenderness. There is no rebound and no guarding.  Musculoskeletal: She exhibits edema and tenderness.  Multiple small, intermdte and large joint swelling with deformities. Gait unsteady and antalgic  Lymphadenopathy:    She has no cervical adenopathy.  Neurological: She is alert and oriented to person, place, and time. She has normal reflexes.  Skin: Skin is warm and dry. No rash noted.  Psychiatric: She has a normal mood and affect. Her behavior is normal. Judgment and thought content normal.     Labs reviewed: Office Visit on 09/19/2015  Component Date Value Ref Range Status  . Glucose 09/19/2015 86  65 - 99 mg/dL Final  . BUN 09/19/2015 15  8 - 27 mg/dL Final  . Creatinine, Ser 09/19/2015 0.86  0.57 - 1.00 mg/dL Final  . GFR calc non Af Amer 09/19/2015 63  >59 mL/min/1.73 Final  . GFR calc Af Amer 09/19/2015 72  >59 mL/min/1.73 Final  . BUN/Creatinine Ratio 09/19/2015 17  11 - 26 Final  . Sodium 09/19/2015 136  136 - 144 mmol/L Final  . Potassium 09/19/2015 4.1  3.5 - 5.2 mmol/L Final  . Chloride 09/19/2015 99  97 - 106 mmol/L Final  . CO2 09/19/2015 21  18 - 29 mmol/L Final  . Calcium 09/19/2015 9.7  8.7 - 10.3 mg/dL Final  Admission on 09/10/2015, Discharged on 09/12/2015  Component Date Value  Ref Range Status  . WBC 09/11/2015 8.5  4.0 - 10.5 K/uL Final  . RBC 09/11/2015 2.98* 3.87 - 5.11 MIL/uL Final  . Hemoglobin 09/11/2015 9.3* 12.0 - 15.0 g/dL Final  . HCT 09/11/2015 27.4* 36.0 - 46.0 % Final  . MCV 09/11/2015 91.9  78.0 - 100.0 fL Final  . MCH  09/11/2015 31.2  26.0 - 34.0 pg Final  . MCHC 09/11/2015 33.9  30.0 - 36.0 g/dL Final  . RDW 09/11/2015 17.8* 11.5 - 15.5 % Final  . Platelets 09/11/2015 342  150 - 400 K/uL Final  . Troponin i, poc 09/11/2015 0.01  0.00 - 0.08 ng/mL Final  . Comment 3 09/11/2015          Final   Comment: Due to the release kinetics of cTnI, a negative result within the first hours of the onset of symptoms does not rule out myocardial infarction with certainty. If myocardial infarction is still suspected, repeat the test at appropriate intervals.   . Color, Urine 09/11/2015 YELLOW  YELLOW Final  . APPearance 09/11/2015 CLEAR  CLEAR Final  . Specific Gravity, Urine 09/11/2015 1.014  1.005 - 1.030 Final  . pH 09/11/2015 7.5  5.0 - 8.0 Final  . Glucose, UA 09/11/2015 NEGATIVE  NEGATIVE mg/dL Final  . Hgb urine dipstick 09/11/2015 NEGATIVE  NEGATIVE Final  . Bilirubin Urine 09/11/2015 NEGATIVE  NEGATIVE Final  . Ketones, ur 09/11/2015 NEGATIVE  NEGATIVE mg/dL Final  . Protein, ur 09/11/2015 NEGATIVE  NEGATIVE mg/dL Final  . Urobilinogen, UA 09/11/2015 1.0  0.0 - 1.0 mg/dL Final  . Nitrite 09/11/2015 NEGATIVE  NEGATIVE Final  . Leukocytes, UA 09/11/2015 NEGATIVE  NEGATIVE Final   MICROSCOPIC NOT DONE ON URINES WITH NEGATIVE PROTEIN, BLOOD, LEUKOCYTES, NITRITE, OR GLUCOSE <1000 mg/dL.  [place] Kitchen Specimen Description 09/11/2015 URINE, CATHETERIZED   Final  . Special Requests 09/11/2015 Normal   Final  . Culture 09/11/2015    Final                   Value:NO GROWTH 1 DAY Performed at [facility]   . Report Status 09/11/2015 09/12/2015 FINAL   Final  . Sodium 09/11/2015 134* 135 - 145 mmol/L Final  . Potassium 09/11/2015 4.1  3.5 - 5.1 mmol/L Final  . Chloride 09/11/2015 103  101 - 111 mmol/L Final  . CO2 09/11/2015 24  22 - 32 mmol/L Final  . Glucose, Bld 09/11/2015 105* 65 - 99 mg/dL Final  . BUN 09/11/2015 20  6 - 20 mg/dL Final  . Creatinine, Ser 09/11/2015 0.96  0.44 - 1.00 mg/dL Final  . Calcium  09/11/2015 9.3  8.9 - 10.3 mg/dL Final  . Total Protein 09/11/2015 7.4  6.5 - 8.1 g/dL Final  . Albumin 09/11/2015 3.6  3.5 - 5.0 g/dL Final  . AST 09/11/2015 16  15 - 41 U/L Final  . ALT 09/11/2015 11* 14 - 54 U/L Final  . Alkaline Phosphatase 09/11/2015 58  38 - 126 U/L Final  . Total Bilirubin 09/11/2015 0.6  0.3 - 1.2 mg/dL Final  . GFR calc non Af Amer 09/11/2015 53* >60 mL/min Final  . GFR calc Af Amer 09/11/2015 >60  >60 mL/min Final   Comment: (NOTE) The eGFR has been calculated using the CKD EPI equation. This calculation has not been validated in all clinical situations. eGFR's persistently <60 mL/min signify possible Chronic Kidney Disease.   . Anion gap 09/11/2015 7  5 - 15 Final  . Lipase 09/11/2015 30  11 - 51 U/L Final   Please note change in reference range.  . Sodium 09/11/2015 134* 135 - 145 mmol/L Final  . Potassium 09/11/2015 4.1  3.5 - 5.1 mmol/L Final  . Chloride 09/11/2015 100* 101 - 111 mmol/L Final  . BUN 09/11/2015 21* 6 - 20 mg/dL Final  . Creatinine, Ser 09/11/2015 1.10* 0.44 - 1.00 mg/dL Final  . Glucose, Bld 09/11/2015 106* 65 - 99 mg/dL Final  . Calcium, Ion 09/11/2015 1.24  1.13 - 1.30 mmol/L Final  . TCO2 09/11/2015 23  0 - 100 mmol/L Final  . Hemoglobin 09/11/2015 10.5* 12.0 - 15.0 g/dL Final  . HCT 09/11/2015 31.0* 36.0 - 46.0 % Final  . Lactic Acid, Venous 09/11/2015 0.93  0.5 - 2.0 mmol/L Final  . Troponin i, poc 09/11/2015 0.00  0.00 - 0.08 ng/mL Final  . Comment 3 09/11/2015          Final   Comment: Due to the release kinetics of cTnI, a negative result within the first hours of the onset of symptoms does not rule out myocardial infarction with certainty. If myocardial infarction is still suspected, repeat the test at appropriate intervals.   . Lactic Acid, Venous 09/11/2015 0.49* 0.5 - 2.0 mmol/L Final  . B Natriuretic Peptide 09/11/2015 336.8* 0.0 - 100.0 pg/mL Final  . Troponin i, poc 09/11/2015 0.00  0.00 - 0.08 ng/mL Final  .  Comment 3 09/11/2015          Final   Comment: Due to the release kinetics of cTnI, a negative result within the first hours of the onset of symptoms does not rule out myocardial infarction with certainty. If myocardial infarction is still suspected, repeat the test at appropriate intervals.   . Troponin I 09/11/2015 <0.03  <0.031 ng/mL Final   Comment:        NO INDICATION OF MYOCARDIAL INJURY.   . Troponin I 09/11/2015 <0.03  <0.031 ng/mL Final   Comment:        NO INDICATION OF MYOCARDIAL INJURY.   . Troponin I 09/11/2015 <0.03  <0.031 ng/mL Final   Comment:        NO INDICATION OF MYOCARDIAL INJURY.   . Glucose-Capillary 09/11/2015 87  65 - 99 mg/dL Final  . Glucose-Capillary 09/11/2015 88  65 - 99 mg/dL Final  . Glucose-Capillary 09/11/2015 109* 65 - 99 mg/dL Final  . Glucose-Capillary 09/11/2015 117* 65 - 99 mg/dL Final  . Sodium 09/12/2015 136  135 - 145 mmol/L Final  . Potassium 09/12/2015 3.8  3.5 - 5.1 mmol/L Final  . Chloride 09/12/2015 103  101 - 111 mmol/L Final  . CO2 09/12/2015 25  22 - 32 mmol/L Final  . Glucose, Bld 09/12/2015 113* 65 - 99 mg/dL Final  . BUN 09/12/2015 29* 6 - 20 mg/dL Final  . Creatinine, Ser 09/12/2015 0.97  0.44 - 1.00 mg/dL Final  . Calcium 09/12/2015 9.1  8.9 - 10.3 mg/dL Final  . GFR calc non Af Amer 09/12/2015 53* >60 mL/min Final  . GFR calc Af Amer 09/12/2015 >60  >60 mL/min Final   Comment: (NOTE) The eGFR has been calculated using the CKD EPI equation. This calculation has not been validated in all clinical situations. eGFR's persistently <60 mL/min signify possible Chronic Kidney Disease.   . Anion gap 09/12/2015 8  5 - 15 Final  . Glucose-Capillary 09/11/2015 93  65 - 99 mg/dL Final  . Comment 1 09/11/2015 Notify RN   Final  . Comment  2 09/11/2015 Document in Chart   Final  . Glucose-Capillary 09/12/2015 98  65 - 99 mg/dL Final  Abstract on 09/09/2015  Component Date Value Ref Range Status  . Hemoglobin 09/09/2015 10.0*  12.0 - 16.0 g/dL Final  . HCT 09/09/2015 28* 36 - 46 % Final  . Neutrophils Absolute 09/09/2015 5   Final  . Platelets 09/09/2015 340  150 - 399 K/L Final  . WBC 09/09/2015 7.6   Final  . Glucose 09/09/2015 108   Final  . BUN 09/09/2015 14  4 - 21 mg/dL Final  . Creatinine 09/09/2015 0.7  0.5 - 1.1 mg/dL Final  . Potassium 09/09/2015 4.4  3.4 - 5.3 mmol/L Final  . Sodium 09/09/2015 134* 137 - 147 mmol/L Final  . Alkaline Phosphatase 09/09/2015 71  25 - 125 U/L Final  . ALT 09/09/2015 11  7 - 35 U/L Final  . AST 09/09/2015 17  13 - 35 U/L Final  . Bilirubin, Total 09/09/2015 0.5   Final  Office Visit on 09/09/2015  Component Date Value Ref Range Status  . WBC 09/09/2015 7.6  3.4 - 10.8 x10E3/uL Final  . RBC 09/09/2015 3.24* 3.77 - 5.28 x10E6/uL Final  . Hemoglobin 09/09/2015 10.0* 11.1 - 15.9 g/dL Final  . Hematocrit 09/09/2015 28.0* 34.0 - 46.6 % Final  . MCV 09/09/2015 86  79 - 97 fL Final  . MCH 09/09/2015 30.9  26.6 - 33.0 pg Final  . MCHC 09/09/2015 35.7  31.5 - 35.7 g/dL Final  . RDW 09/09/2015 16.8* 12.3 - 15.4 % Final  . Platelets 09/09/2015 340  150 - 379 x10E3/uL Final  . Neutrophils 09/09/2015 62   Final  . Lymphs 09/09/2015 25   Final  . Monocytes 09/09/2015 6   Final  . Eos 09/09/2015 7   Final  . Basos 09/09/2015 0   Final  . Neutrophils Absolute 09/09/2015 4.6  1.4 - 7.0 x10E3/uL Final  . Lymphocytes Absolute 09/09/2015 1.9  0.7 - 3.1 x10E3/uL Final  . Monocytes Absolute 09/09/2015 0.5  0.1 - 0.9 x10E3/uL Final  . EOS (ABSOLUTE) 09/09/2015 0.5* 0.0 - 0.4 x10E3/uL Final  . Basophils Absolute 09/09/2015 0.0  0.0 - 0.2 x10E3/uL Final  . Glucose 09/09/2015 108* 65 - 99 mg/dL Final  . BUN 09/09/2015 14  8 - 27 mg/dL Final  . Creatinine, Ser 09/09/2015 0.73  0.57 - 1.00 mg/dL Final  . GFR calc non Af Amer 09/09/2015 76  >59 mL/min/1.73 Final  . GFR calc Af Amer 09/09/2015 88  >59 mL/min/1.73 Final  . BUN/Creatinine Ratio 09/09/2015 19  11 - 26 Final  . Sodium  09/09/2015 134* 136 - 144 mmol/L Final  . Potassium 09/09/2015 4.4  3.5 - 5.2 mmol/L Final  . Chloride 09/09/2015 102  97 - 106 mmol/L Final  . CO2 09/09/2015 24  18 - 29 mmol/L Final  . Calcium 09/09/2015 9.4  8.7 - 10.3 mg/dL Final  . Total Protein 09/09/2015 7.2  6.0 - 8.5 g/dL Final  . Albumin 09/09/2015 3.9  3.5 - 4.7 g/dL Final  . Globulin, Total 09/09/2015 3.3  1.5 - 4.5 g/dL Final  . Albumin/Globulin Ratio 09/09/2015 1.2  1.1 - 2.5 Final  . Bilirubin Total 09/09/2015 0.5  0.0 - 1.2 mg/dL Final  . Alkaline Phosphatase 09/09/2015 71  39 - 117 IU/L Final  . AST 09/09/2015 17  0 - 40 IU/L Final  . ALT 09/09/2015 11  0 - 32 IU/L Final  Abstract on 08/15/2015  Component Date Value Ref Range Status  . Hemoglobin 08/09/2015 9.8* 12.0 - 16.0 g/dL Final  . HCT 08/09/2015 29* 36 - 46 % Final  . Platelets 08/09/2015 350  150 - 399 K/L Final  . WBC 08/09/2015 7.8   Final  . Glucose 08/09/2015 97   Final  . BUN 08/09/2015 15  4 - 21 mg/dL Final  . Creatinine 08/09/2015 0.8  .5 - 1.1 mg/dL Final  . Potassium 08/09/2015 4.3  3.4 - 5.3 mmol/L Final  . Sodium 08/09/2015 137  137 - 147 mmol/L Final  . Alkaline Phosphatase 08/09/2015 65  25 - 125 U/L Final  . ALT 08/09/2015 7  7 - 35 U/L Final  . AST 08/09/2015 15  13 - 35 U/L Final  . Bilirubin, Total 08/09/2015 0.4   Final  Office Visit on 07/23/2015  Component Date Value Ref Range Status  . Uric Acid 07/23/2015 6.8  2.5 - 7.1 mg/dL Final              Therapeutic target for gout patients: <6.0  . WBC 07/23/2015 8.7  3.4 - 10.8 x10E3/uL Final  . RBC 07/23/2015 3.28* 3.77 - 5.28 x10E6/uL Final  . Hemoglobin 07/23/2015 10.0* 11.1 - 15.9 g/dL Final  . Hematocrit 07/23/2015 30.3* 34.0 - 46.6 % Final  . MCV 07/23/2015 92  79 - 97 fL Final  . MCH 07/23/2015 30.5  26.6 - 33.0 pg Final  . MCHC 07/23/2015 33.0  31.5 - 35.7 g/dL Final  . RDW 07/23/2015 17.9* 12.3 - 15.4 % Final  . Platelets 07/23/2015 365  150 - 379 x10E3/uL Final  . Neutrophils  07/23/2015 62   Final  . Lymphs 07/23/2015 24   Final  . Monocytes 07/23/2015 7   Final  . Eos 07/23/2015 6   Final  . Basos 07/23/2015 1   Final  . Neutrophils Absolute 07/23/2015 5.4  1.4 - 7.0 x10E3/uL Final  . Lymphocytes Absolute 07/23/2015 2.1  0.7 - 3.1 x10E3/uL Final  . Monocytes Absolute 07/23/2015 0.6  0.1 - 0.9 x10E3/uL Final  . EOS (ABSOLUTE) 07/23/2015 0.6* 0.0 - 0.4 x10E3/uL Final  . Basophils Absolute 07/23/2015 0.1  0.0 - 0.2 x10E3/uL Final  . Immature Granulocytes 07/23/2015 0   Final  . Immature Grans (Abs) 07/23/2015 0.0  0.0 - 0.1 x10E3/uL Final  . Glucose 07/23/2015 105* 65 - 99 mg/dL Final  . BUN 07/23/2015 20  8 - 27 mg/dL Final  . Creatinine, Ser 07/23/2015 0.91  0.57 - 1.00 mg/dL Final  . GFR calc non Af Amer 07/23/2015 59* >59 mL/min/1.73 Final  . GFR calc Af Amer 07/23/2015 67  >59 mL/min/1.73 Final  . BUN/Creatinine Ratio 07/23/2015 22  11 - 26 Final  . Sodium 07/23/2015 137  134 - 144 mmol/L Final  . Potassium 07/23/2015 4.9  3.5 - 5.2 mmol/L Final  . Chloride 07/23/2015 101  97 - 108 mmol/L Final  . CO2 07/23/2015 20  18 - 29 mmol/L Final  . Calcium 07/23/2015 9.8  8.7 - 10.3 mg/dL Final  . Total Protein 07/23/2015 7.2  6.0 - 8.5 g/dL Final  . Albumin 07/23/2015 3.9  3.5 - 4.7 g/dL Final  . Globulin, Total 07/23/2015 3.3  1.5 - 4.5 g/dL Final  . Albumin/Globulin Ratio 07/23/2015 1.2  1.1 - 2.5 Final  . Bilirubin Total 07/23/2015 0.3  0.0 - 1.2 mg/dL Final  . Alkaline Phosphatase 07/23/2015 65  39 - 117 IU/L Final  . AST 07/23/2015 16  0 -  40 IU/L Final  . ALT 07/23/2015 7  0 - 32 IU/L Final  . TSH 07/23/2015 1.420  0.450 - 4.500 uIU/mL Final  . Color, UA 07/23/2015 Light Yellow   Final  . Clarity, UA 07/23/2015 Clear   Final  . Glucose, UA 07/23/2015 Neg   Final  . Bilirubin, UA 07/23/2015 Neg   Final  . Ketones, UA 07/23/2015 Neg   Final  . Spec Grav, UA 07/23/2015 1.010   Final  . Blood, UA 07/23/2015 Neg   Final  . pH, UA 07/23/2015 6.0   Final    . Protein, UA 07/23/2015 Neg   Final  . Urobilinogen, UA 07/23/2015 0.2   Final  . Nitrite, UA 07/23/2015 Neg   Final  . Leukocytes, UA 07/23/2015 Negative  Negative Final    Dg Chest 2 View  09/10/2015  CLINICAL DATA:  Fever and body aches for 24 hours. Upper chest pain. EXAM: CHEST  2 VIEW COMPARISON:  11/21/2014 FINDINGS: There is moderate cardiomegaly and aortic tortuosity, unchanged. No airspace consolidation. No effusions. Pulmonary vasculature is normal. Severe chronic shoulder arthropathy bilaterally. IMPRESSION: Cardiomegaly.  No acute cardiopulmonary findings. Electronically Signed   By: Andreas Newport [name]   On: 09/10/2015 23:29   Ct Angio Chest Pe W/cm &/or Wo Cm  09/11/2015  CLINICAL DATA:  Midchest pain.  Right lower quadrant pain. EXAM: CT ANGIOGRAPHY CHEST CT ABDOMEN AND PELVIS WITH CONTRAST TECHNIQUE: Multidetector CT imaging of the chest was performed using the standard protocol during bolus administration of intravenous contrast. Multiplanar CT image reconstructions and MIPs were obtained to evaluate the vascular anatomy. Multidetector CT imaging of the abdomen and pelvis was performed using the standard protocol during bolus administration of intravenous contrast. CONTRAST:  171m OMNIPAQUE IOHEXOL 350 MG/ML SOLN COMPARISON:  06/12/2014 FINDINGS: CTA CHEST FINDINGS Cardiovascular: There is good opacification of the pulmonary arteries. There is no pulmonary embolism. The thoracic aorta is normal in caliber and intact. Lungs: Mild ground-glass opacities, central predominant, possibly due to alveolar edema. No pulmonary masses or nodules. Central airways: Patent Effusions: None Lymphadenopathy: None Esophagus: Unremarkable Musculoskeletal: No significant abnormality. Moderate degenerative disc changes in the thoracic spine. CT ABDOMEN and PELVIS FINDINGS Hepatobiliary: Normal liver. Upper normal caliber of the extrahepatic biliary system. No intrahepatic bile duct dilatation. No  biliary calculus or mass is evident. Gallbladder is unremarkable. Pancreas: Normal Spleen: Normal Adrenals/Urinary Tract: Benign cyst at the lateral aspect of the right kidney. Otherwise normal kidneys, ureters, and adrenals. Urinary bladder is empty but grossly unremarkable. Stomach/Bowel: Small hiatal hernia. Stomach and small bowel are otherwise unremarkable. Colon is remarkable only for moderate uncomplicated colonic diverticulosis. Prior appendectomy Vascular/Lymphatic: The abdominal aorta is normal in caliber. There is mild atherosclerotic calcification. There is no adenopathy in the abdomen or pelvis. Reproductive: Hysterectomy.  No adnexal abnormalities. Other: No acute inflammatory changes are evident in the abdomen or pelvis. There is no ascites. Musculoskeletal: Moderately severe degenerative lumbar disc disease. Grade 1 spondylolisthesis at L3-4 and L4-5. Facet arthropathy from L2 through the sacrum. No significant skeletal lesions. Review of the MIP images confirms the above findings. IMPRESSION: 1. Negative for acute pulmonary embolism. 2. Central ground-glass opacities in both lungs may represent alveolar edema 3. No acute findings in the abdomen or pelvis. 4. Small hiatal hernia. 5. Diverticulosis. Electronically Signed   By: DAndreas NewportM.D.   On: 09/11/2015 02:12   Ct Abdomen Pelvis W Contrast  09/11/2015  CLINICAL DATA:  Midchest pain.  Right lower quadrant pain.  EXAM: CT ANGIOGRAPHY CHEST CT ABDOMEN AND PELVIS WITH CONTRAST TECHNIQUE: Multidetector CT imaging of the chest was performed using the standard protocol during bolus administration of intravenous contrast. Multiplanar CT image reconstructions and MIPs were obtained to evaluate the vascular anatomy. Multidetector CT imaging of the abdomen and pelvis was performed using the standard protocol during bolus administration of intravenous contrast. CONTRAST:  164m OMNIPAQUE IOHEXOL 350 MG/ML SOLN COMPARISON:  06/12/2014 FINDINGS: CTA  CHEST FINDINGS Cardiovascular: There is good opacification of the pulmonary arteries. There is no pulmonary embolism. The thoracic aorta is normal in caliber and intact. Lungs: Mild ground-glass opacities, central predominant, possibly due to alveolar edema. No pulmonary masses or nodules. Central airways: Patent Effusions: None Lymphadenopathy: None Esophagus: Unremarkable Musculoskeletal: No significant abnormality. Moderate degenerative disc changes in the thoracic spine. CT ABDOMEN and PELVIS FINDINGS Hepatobiliary: Normal liver. Upper normal caliber of the extrahepatic biliary system. No intrahepatic bile duct dilatation. No biliary calculus or mass is evident. Gallbladder is unremarkable. Pancreas: Normal Spleen: Normal Adrenals/Urinary Tract: Benign cyst at the lateral aspect of the right kidney. Otherwise normal kidneys, ureters, and adrenals. Urinary bladder is empty but grossly unremarkable. Stomach/Bowel: Small hiatal hernia. Stomach and small bowel are otherwise unremarkable. Colon is remarkable only for moderate uncomplicated colonic diverticulosis. Prior appendectomy Vascular/Lymphatic: The abdominal aorta is normal in caliber. There is mild atherosclerotic calcification. There is no adenopathy in the abdomen or pelvis. Reproductive: Hysterectomy.  No adnexal abnormalities. Other: No acute inflammatory changes are evident in the abdomen or pelvis. There is no ascites. Musculoskeletal: Moderately severe degenerative lumbar disc disease. Grade 1 spondylolisthesis at L3-4 and L4-5. Facet arthropathy from L2 through the sacrum. No significant skeletal lesions. Review of the MIP images confirms the above findings. IMPRESSION: 1. Negative for acute pulmonary embolism. 2. Central ground-glass opacities in both lungs may represent alveolar edema 3. No acute findings in the abdomen or pelvis. 4. Small hiatal hernia. 5. Diverticulosis. Electronically Signed   By: DAndreas NewportM.D.   On: 09/11/2015 02:12      Assessment/Plan   ICD-9-CM ICD-10-CM   1. Chronic diastolic heart failure (HCC) - stable 428.32 I50.32 Ambulatory referral to Cardiology  2. Essential hypertension - borderline stable 401.9 I10 Ambulatory referral to Cardiology  3. Rheumatoid arthritis involving multiple sites, unspecified rheumatoid factor presence (HCC) - stable 714.0 M06.9   4. Type 2 diabetes mellitus with other diabetic kidney complication (HCC) - controlled 250.40 E11.29 Hemoglobin A1c  5. Idiopathic gout, unspecified chronicity, unspecified site - stable 274.9 M10.00    Continue current medications as ordered  Follow up in April 2017 for CPE/medicare  Follow up with specialists as scheduled  Will call with cardiology appt  MW[facility] HospitalS. CPerlie Gold PNorth [facility] Centerand [facility] [address]GEnc[place] [phone] (Monday-Friday 8 AM - 5 PM) [phone] 5 PM and follow prompts

## 2015-10-10 LAB — HEMOGLOBIN A1C
Est. average glucose Bld gHb Est-mCnc: 126 mg/dL
Hgb A1c MFr Bld: 6 % — ABNORMAL HIGH (ref 4.8–5.6)

## 2015-10-16 ENCOUNTER — Other Ambulatory Visit: Payer: Self-pay | Admitting: Internal Medicine

## 2015-10-20 ENCOUNTER — Other Ambulatory Visit: Payer: Self-pay | Admitting: Internal Medicine

## 2015-10-25 ENCOUNTER — Ambulatory Visit: Payer: Medicare Other | Admitting: Podiatry

## 2015-10-31 ENCOUNTER — Ambulatory Visit (INDEPENDENT_AMBULATORY_CARE_PROVIDER_SITE_OTHER): Payer: Medicare Other | Admitting: Podiatry

## 2015-10-31 ENCOUNTER — Ambulatory Visit: Payer: Medicare Other | Admitting: Podiatry

## 2015-10-31 ENCOUNTER — Encounter: Payer: Self-pay | Admitting: Podiatry

## 2015-10-31 VITALS — Ht 64.0 in | Wt 170.0 lb

## 2015-10-31 DIAGNOSIS — M79673 Pain in unspecified foot: Secondary | ICD-10-CM | POA: Diagnosis not present

## 2015-10-31 DIAGNOSIS — M79606 Pain in leg, unspecified: Secondary | ICD-10-CM

## 2015-10-31 DIAGNOSIS — B351 Tinea unguium: Secondary | ICD-10-CM

## 2015-10-31 DIAGNOSIS — R600 Localized edema: Secondary | ICD-10-CM

## 2015-10-31 DIAGNOSIS — M2040 Other hammer toe(s) (acquired), unspecified foot: Secondary | ICD-10-CM | POA: Insufficient documentation

## 2015-10-31 DIAGNOSIS — E1129 Type 2 diabetes mellitus with other diabetic kidney complication: Secondary | ICD-10-CM

## 2015-10-31 NOTE — Patient Instructions (Signed)
Seen for hypertrophic nails. All nails debrided. Both feet measured for diabetic shoes. Return in 3 months or as needed.  

## 2015-10-31 NOTE — Progress Notes (Signed)
SUBJECTIVE: 81 y.o. year old female accompanied by her daughter presents requesting toe nails trimmed.  Contracted middle toes are very painful . Also wants to order diabetic shoes.   OBJECTIVE: DERMATOLOGIC EXAMINATION: Nails: Hypertrophic nails x 10. VASCULAR EXAMINATION OF LOWER LIMBS: Pedal pulses: All pedal pulses are palpable with normal pulsation.  No edema or erythema noted.  NEUROLOGIC EXAMINATION OF THE LOWER LIMBS: All epicritic and tactile sensations grossly intact.  MUSCULOSKELETAL EXAMINATION: Severe contracted 3rd digits bilateral painful.  Assessment: Onychomycosis x 10.  Hammer toe deformity 3rd bilateral painful in shoes.  PLAN: All nails debrided. Both feet measured for diabetic shoes 

## 2015-11-06 NOTE — Progress Notes (Signed)
6   Cardiology Office Note   Date:  11/07/2015   ID:  [name] [name], DOB January 12, 1930, MRN [id number]  PCP:  Kirt Boys, DO  Cardiologist:   Madilyn Hook, MD   Chief Complaint  Patient presents with  . New Evaluation    CHRONIC DIASTOLIC HEART FAILURE  . Hypertension    NO OTHER COMPLAINTS.    History of Present Illness: [name] [name] is a 81 y.o. female with hypertension, Rheumatoid Arthritis, diabetes type 2, CAD s/p MI, CVA, and chronic diastolic heart failure who presents for management of chronic diastolic heart failure.  [name] [name] was admitted to the [facility] Medicine service 11/1-11/3 with chest pain.  At that time she reported constant chest pain that began in the setting of a cold.  CT scan of the chest was concerning for edema.  She received IV lasix and diuresed with improvement in her breathing.  She was also started on oral lasix.  [name] [name] has been doing well since discharge.  She denies lower extremity edema, orthopnea or PND.  She sleeps with the head of her hospital bed elevated, as she has been doing this for as long as she can remember.  She denies PND. [name] [name] does not get much exercise due to rheumatoid arthritis pain.  She does some walking in the house but has not been able to walk outside much due to the weather.  Her walking is also limited by fatigue.  She tries to do leg and arm exercises regularly while sitting in her chair.  She denies any recent falls, but did have issues with syncope in the past. She has struggled to keep her blood pressure under control.   Past Medical History  Diagnosis Date  . Hypertension   . First degree atrioventricular block   . Onychia and paronychia of toe   . Muscle weakness (generalized)   . Allergic rhinitis due to pollen 04/27/2007  . Gout, unspecified 04/19/2013  . Disorder of bone and cartilage, unspecified   . Edema 05/03/2013  . Hyperlipidemia 04/27/2007  . Unspecified essential hypertension  08/08/2013  . Coronary atherosclerosis of native coronary artery   . Other malaise and fatigue   . Cough   . Other fall   . Unspecified vitamin D deficiency   . TIA (transient ischemic attack)     "a few one summer" (08/07/2013)  . Myocardial infarction (HCC) ~ 1970  . Exertional shortness of breath     "sometimes" (08/07/2013)  . Type II diabetes mellitus (HCC)     "fasting 90-110s" (08/07/2013)  . Osteoarthrosis, unspecified whether generalized or localized, unspecified site 04/27/2007  . Prepatellar bursitis   . Arthritis     "in q joint" (08/07/2013)  . Stroke (HCC) 01/17/2014  . Seizures (HCC)   . Muscle spasm     Past Surgical History  Procedure Laterality Date  . Tonsillectomy  08/1974  . Replacement total knee Right 09/2005  . Shoulder open rotator cuff repair Right 07/1999  . Appendectomy  1960  . Abdominal hysterectomy  04/1980  . Transthoracic echocardiogram  2003    EF 55-65%; mild concentric LVH  . Joint replacement    . Total knee arthroplasty Left 08/07/2013  . Cataract extraction w/ intraocular lens  implant, bilateral Bilateral ~ 2012  . Total knee arthroplasty Left 08/07/2013    Procedure: TOTAL KNEE ARTHROPLASTY- LEFT;  Surgeon: Dannielle Huh, MD;  Location: [facility] OR;  Service: Orthopedics;  Laterality: Left;  . Cardiac catheterization  2003     Current Outpatient Prescriptions  Medication Sig Dispense Refill  . allopurinol (ZYLOPRIM) 100 MG tablet TAKE 1 TABLET BY MOUTH EVERY DAY FOR GOUT (Patient taking differently: Take 100 mg by mouth daily. ) 30 tablet 5  . aspirin EC 81 MG tablet Take 81 mg by mouth daily.    . benzonatate (TESSALON) 100 MG capsule take 2 capsules by mouth three times a day if needed for cough 30 capsule 0  . calcium-vitamin D (OSCAL WITH D) 500-200 MG-UNIT per tablet Take 1 tablet by mouth 2 (two) times daily.    . Cholecalciferol (VITAMIN D-3) 1000 UNITS CAPS Take 1 capsule by mouth daily.    . cloNIDine (CATAPRES) 0.1 MG tablet Take 0.1  mg by mouth daily as needed (blood presure is above 160). For SBP>160    . ferrous sulfate 325 (65 FE) MG tablet Take 325 mg by mouth 2 (two) times daily with a meal.    . folic acid (FOLVITE) 1 MG tablet Take 2 mg by mouth daily.     . furosemide (LASIX) 20 MG tablet Take 1 tablet (20 mg total) by mouth daily. 30 tablet 3  . glucose blood (ONE TOUCH ULTRA TEST) test strip Check blood sugar once daily DX 250.00 100 each 3  . HUMIRA PEN 40 MG/0.8ML PNKT Inject 1 each as directed every 14 (fourteen) days.    . hydrALAZINE (APRESOLINE) 25 MG tablet Take 25 mg by mouth 2 (two) times daily.    . hydrALAZINE (APRESOLINE) 25 MG tablet take 1 tablet by mouth four times a day 120 tablet 0  . labetalol (NORMODYNE) 100 MG tablet Take 2 tablets (200 mg total) by mouth 2 (two) times daily. 120 tablet 5  . lisinopril (PRINIVIL,ZESTRIL) 30 MG tablet Take 1 tablet (30 mg total) by mouth daily. 30 tablet 3  . metFORMIN (GLUCOPHAGE) 500 MG tablet TAKE 1 TABLET BY MOUTH EVERY DAY FOR DIABETES (Patient taking differently: Take 500 mg by mouth daily with breakfast. ) 90 tablet 1  . methotrexate (RHEUMATREX) 2.5 MG tablet Provided by rheumatology- started on 4 tablets every week for 2 weeks, then 6 tab every week for 2 weeks, then 8 tab every week 12 tablet 0  . OVER THE COUNTER MEDICATION Take 1 tablet by mouth 2 (two) times daily. Wellness formula for immune support    . OVER THE COUNTER MEDICATION Take 5 mLs by mouth every morning. Chloraphil liquid, mix in apply juice    . oxycodone (OXY-IR) 5 MG capsule Take 1 capsule (5 mg total) by mouth every 4 (four) hours as needed for pain (Arthritis pain). 30 capsule 0  . polyvinyl alcohol (LIQUIFILM TEARS) 1.4 % ophthalmic solution Place 1 drop into both eyes as needed (for dry eyes).    . predniSONE (DELTASONE) 1 MG tablet Take 1 mg by mouth daily with breakfast.     . traMADol (ULTRAM) 50 MG tablet take 1-2 tablets by mouth every 6 hours for MODERATE pain 120 tablet 0  .  triamcinolone cream (KENALOG) 0.1 % apply to affected area twice a day if needed for IRRITATION 30 g 2  . vitamin C (ASCORBIC ACID) 500 MG tablet Take 500 mg by mouth daily.     No current facility-administered medications for this visit.    Allergies:   Cetirizine hcl; Codeine; Fish allergy; Indomethacin; Lyrica; Methyldopa; Shellfish allergy; and Tomato    Social History:  The patient  reports that she has never smoked. She has  never used smokeless tobacco. She reports that she does not drink alcohol or use illicit drugs.   Family History:  The patient's family history includes Heart attack in her father.    ROS:  Please see the history of present illness.   Otherwise, review of systems are positive for allergies.   All other systems are reviewed and negative.    PHYSICAL EXAM: VS:  BP 148/62 mmHg  Pulse 85  Ht 5\' 4"  (1.626 m)  Wt 71.668 kg (158 lb)  BMI 27.11 kg/m2 , BMI Body mass index is 27.11 kg/(m^2). GENERAL:  Well appearing HEENT:  Pupils equal round and reactive, fundi not visualized, oral mucosa unremarkable NECK:  No jugular venous distention, waveform within normal limits, carotid upstroke brisk and symmetric, no bruits, no thyromegaly LYMPHATICS:  No cervical adenopathy LUNGS:  Clear to auscultation bilaterally HEART:  RRR.  PMI not displaced or sustained,S1 and S2 within normal limits, no S3, no S4, no clicks, no rubs, no murmurs ABD:  Flat, positive bowel sounds normal in frequency in pitch, no bruits, no rebound, no guarding, no midline pulsatile mass, no hepatomegaly, no splenomegaly EXT:  2 plus pulses throughout, no edema, no cyanosis no clubbing.  Bilateral wrist synovitis. SKIN:  No rashes no nodules NEURO:  Cranial nerves II through XII grossly intact, motor grossly intact throughout PSYCH:  Cognitively intact, oriented to person place and time    EKG:  EKG is not ordered today.   Echo 01/18/14: Study Conclusions  - Left ventricle: The cavity size was  normal. Wall thickness was increased in a pattern of moderate to severe LVH. There was moderate focal basal hypertrophy of the septum. Systolic function was normal. The estimated ejection fraction was in the range of 60% to 65%. Wall motion was normal; there were no regional wall motion abnormalities. Doppler parameters are consistent with abnormal left ventricular relaxation (grade 1 diastolic dysfunction). - Left atrium: The atrium was moderately dilated. - Pulmonary arteries: Systolic pressure was mildly increased.  Carotid ultrasound 01/18/14: Summary: Bilateral: intimal wall thickening CCA. 1-39% ICA stenosis. Vertebral artery flow is antegrade.   Recent Labs: 07/23/2015: TSH 1.420 09/11/2015: ALT 11*; B Natriuretic Peptide 336.8*; Hemoglobin 10.5*; Platelets 342 09/19/2015: BUN 15; Creatinine, Ser 0.86; Potassium 4.1; Sodium 136    Lipid Panel    Component Value Date/Time   CHOL 180 07/03/2015 1450   CHOL 171 01/18/2014 0043   TRIG 65 07/03/2015 1450   HDL 72 07/03/2015 1450   HDL 71 01/18/2014 0043   CHOLHDL 2.5 07/03/2015 1450   CHOLHDL 2.4 01/18/2014 0043   VLDL 23 01/18/2014 0043   LDLCALC 95 07/03/2015 1450   LDLCALC 77 01/18/2014 0043      Wt Readings from Last 3 Encounters:  11/07/15 71.668 kg (158 lb)  10/31/15 77.111 kg (170 lb)  10/09/15 80.287 kg (177 lb)      ASSESSMENT AND PLAN:  # Chronic diastolic heart failure:  Ms. Gering is currently euvolemic.  Her blood pressure is fairly well-controlled given her history of syncope.  We will check a BMP today given that she is now on daily lasix.  She will continue clonidine, hydralazine, lisinopril, and labetalol.   # Hypertension: BP fairly well-controlled as above.  Given her age and the fact that she has a history of sycope and falls, we will not make any changes to her antihypertensive regimen at this time.  # Chest pain: Resolved.  Likely related to cold symptoms and volume overload.   We  will not pursue an ischemia evaluation at this time.  # CAD s/p MI: Ms. Hibbard had an MI in the 19s.  Ideally, she would be on a statin.  She already expressed the desire to take less medicine, not more. Therefore it is reasonable not to statin at this time.  We will revisit this in 6 months. Continue aspirin and labetalol.  Current medicines are reviewed at length with the patient today.  The patient does not have concerns regarding medicines.  The following changes have been made:  no change  Labs/ tests ordered today include:   Orders Placed This Encounter  Procedures  . Basic metabolic panel     Disposition:   FU with Shaeley Segall C. Duke Salvia, MD, [facility] in 6 months.    Signed, Macenzie Burford C. Duke Salvia, MD, [facility]  11/07/2015 10:45 AM    [facility]

## 2015-11-07 ENCOUNTER — Encounter: Payer: Self-pay | Admitting: Cardiovascular Disease

## 2015-11-07 ENCOUNTER — Ambulatory Visit (INDEPENDENT_AMBULATORY_CARE_PROVIDER_SITE_OTHER): Payer: Medicare Other | Admitting: Cardiovascular Disease

## 2015-11-07 VITALS — BP 148/62 | HR 85 | Ht 64.0 in | Wt 158.0 lb

## 2015-11-07 DIAGNOSIS — I2510 Atherosclerotic heart disease of native coronary artery without angina pectoris: Secondary | ICD-10-CM | POA: Diagnosis not present

## 2015-11-07 DIAGNOSIS — I5032 Chronic diastolic (congestive) heart failure: Secondary | ICD-10-CM | POA: Diagnosis not present

## 2015-11-07 DIAGNOSIS — Z79899 Other long term (current) drug therapy: Secondary | ICD-10-CM | POA: Diagnosis not present

## 2015-11-07 DIAGNOSIS — I110 Hypertensive heart disease with heart failure: Secondary | ICD-10-CM

## 2015-11-07 DIAGNOSIS — E785 Hyperlipidemia, unspecified: Secondary | ICD-10-CM

## 2015-11-07 DIAGNOSIS — R072 Precordial pain: Secondary | ICD-10-CM

## 2015-11-07 LAB — BASIC METABOLIC PANEL
BUN: 19 mg/dL (ref 7–25)
CO2: 20 mmol/L (ref 20–31)
Calcium: 9.4 mg/dL (ref 8.6–10.4)
Chloride: 104 mmol/L (ref 98–110)
Creat: 0.82 mg/dL (ref 0.60–0.88)
Glucose, Bld: 87 mg/dL (ref 65–99)
Potassium: 4.1 mmol/L (ref 3.5–5.3)
Sodium: 137 mmol/L (ref 135–146)

## 2015-11-07 NOTE — Patient Instructions (Signed)
Dr Duke Salvia recommends that you return for lab work TODAY.  Your physician recommends that you schedule a follow-up appointment in 6 months. You will receive a reminder letter in the mail two months in advance. If you don't receive a letter, please call our office to schedule the follow-up appointment.  If you need a refill on your cardiac medications before your next appointment, please call your pharmacy.

## 2015-11-08 ENCOUNTER — Telehealth: Payer: Self-pay | Admitting: *Deleted

## 2015-11-08 NOTE — Telephone Encounter (Signed)
Spoke to patient's daughter. labs Result given . Verbalized understanding

## 2015-11-08 NOTE — Telephone Encounter (Signed)
Left message to call back regarding labs

## 2015-11-08 NOTE — Telephone Encounter (Signed)
-----   Message from Chilton Si, MD sent at 11/08/2015  9:30 AM EST ----- Normal kidney function and electrolytes. Continue Lasix as ordered.

## 2015-12-02 ENCOUNTER — Other Ambulatory Visit: Payer: Self-pay | Admitting: *Deleted

## 2015-12-02 DIAGNOSIS — M1990 Unspecified osteoarthritis, unspecified site: Secondary | ICD-10-CM

## 2015-12-02 MED ORDER — TRAMADOL HCL 50 MG PO TABS: ORAL_TABLET | ORAL | Status: DC

## 2015-12-02 NOTE — Telephone Encounter (Signed)
[facility] 

## 2015-12-03 ENCOUNTER — Other Ambulatory Visit: Payer: Self-pay | Admitting: *Deleted

## 2015-12-03 MED ORDER — METFORMIN HCL 500 MG PO TABS: ORAL_TABLET | ORAL | Status: DC

## 2015-12-03 NOTE — Telephone Encounter (Signed)
[facility] 

## 2015-12-04 ENCOUNTER — Other Ambulatory Visit: Payer: Self-pay

## 2015-12-04 MED ORDER — ALLOPURINOL 100 MG PO TABS: ORAL_TABLET | ORAL | Status: DC

## 2015-12-25 ENCOUNTER — Other Ambulatory Visit: Payer: Self-pay | Admitting: Internal Medicine

## 2015-12-30 ENCOUNTER — Other Ambulatory Visit: Payer: Self-pay | Admitting: *Deleted

## 2015-12-30 DIAGNOSIS — M1990 Unspecified osteoarthritis, unspecified site: Secondary | ICD-10-CM

## 2015-12-30 MED ORDER — OXYCODONE HCL 5 MG PO CAPS: 5.0000 mg | ORAL_CAPSULE | ORAL | Status: DC | PRN

## 2015-12-30 MED ORDER — TRAMADOL HCL 50 MG PO TABS: ORAL_TABLET | ORAL | Status: DC

## 2015-12-30 NOTE — Telephone Encounter (Signed)
Patient daughter, Michel Bickers and will pick up

## 2016-01-07 ENCOUNTER — Telehealth: Payer: Self-pay | Admitting: *Deleted

## 2016-01-07 MED ORDER — AMBULATORY NON FORMULARY MEDICATION: Status: DC

## 2016-01-07 NOTE — Telephone Encounter (Signed)
Ok for hospital bed 

## 2016-01-07 NOTE — Telephone Encounter (Signed)
Order written and placed for Dr. Montez Morita to review and sign.

## 2016-01-07 NOTE — Telephone Encounter (Addendum)
Patient daughter, Para March called and left message and stated that patient needs a Rx for a Hospital Bed. [name] is not working properly. Would like a Rx faxed to [facility] Fax: [phone]. Please Advise.

## 2016-01-20 ENCOUNTER — Other Ambulatory Visit: Payer: Self-pay | Admitting: *Deleted

## 2016-01-20 MED ORDER — FUROSEMIDE 20 MG PO TABS: 20.0000 mg | ORAL_TABLET | Freq: Every day | ORAL | Status: DC

## 2016-01-20 MED ORDER — LISINOPRIL 30 MG PO TABS: 30.0000 mg | ORAL_TABLET | Freq: Every day | ORAL | Status: DC

## 2016-01-20 NOTE — Telephone Encounter (Signed)
[facility] 

## 2016-01-22 ENCOUNTER — Other Ambulatory Visit: Payer: Self-pay | Admitting: Internal Medicine

## 2016-01-27 ENCOUNTER — Telehealth: Payer: Self-pay | Admitting: *Deleted

## 2016-01-27 NOTE — Telephone Encounter (Signed)
She has an appt this week with me. If she feels she cannot wait, recommend urgent care

## 2016-01-27 NOTE — Telephone Encounter (Signed)
Patient daughter, Para March called and left message stating that patient would like to speak with our office regarding some concerns. I called patient  And she stated:  She wants to know what she can do for her cold sx. Runny nose, head  congestion and cough, no fever. Patient stated she is taking the diabetic cough syrup but not helping. No available appointment. Please Advise.

## 2016-01-28 NOTE — Telephone Encounter (Signed)
Patient doesn't have an appointment with you until April for a CPE. She has an appointment tomorrow with a foot Dr.  Jeanene Erb daughter, Para March and she is going to take patient to urgent care to be evaluated.

## 2016-01-29 ENCOUNTER — Encounter: Payer: Self-pay | Admitting: Podiatry

## 2016-01-29 ENCOUNTER — Ambulatory Visit (INDEPENDENT_AMBULATORY_CARE_PROVIDER_SITE_OTHER): Payer: Medicare Other | Admitting: Podiatry

## 2016-01-29 VITALS — BP 176/98 | HR 92

## 2016-01-29 DIAGNOSIS — M79606 Pain in leg, unspecified: Secondary | ICD-10-CM

## 2016-01-29 DIAGNOSIS — B351 Tinea unguium: Secondary | ICD-10-CM | POA: Diagnosis not present

## 2016-01-29 NOTE — Progress Notes (Signed)
SUBJECTIVE: 82 y.o. year old female accompanied by her daughter presents requesting toe nails trimmed.  Contracted middle toes are very painful . Also wants to order diabetic shoes.   OBJECTIVE: DERMATOLOGIC EXAMINATION: Nails: Hypertrophic nails x 10. VASCULAR EXAMINATION OF LOWER LIMBS: Pedal pulses: All pedal pulses are palpable with normal pulsation.  No edema or erythema noted.  NEUROLOGIC EXAMINATION OF THE LOWER LIMBS: All epicritic and tactile sensations grossly intact.  MUSCULOSKELETAL EXAMINATION: Severe contracted 3rd digits bilateral painful.  Assessment: Onychomycosis x 10.  Hammer toe deformity 3rd bilateral painful in shoes.  PLAN: All nails debrided. Both feet measured for diabetic shoes

## 2016-01-29 NOTE — Patient Instructions (Signed)
Seen for hypertrophic nails. All nails debrided. Return in 3 months or as needed.  

## 2016-02-11 LAB — CBC AND DIFFERENTIAL: Platelets: 305 10*3/uL (ref 150–399)

## 2016-02-17 ENCOUNTER — Other Ambulatory Visit: Payer: Self-pay | Admitting: Internal Medicine

## 2016-02-18 LAB — BASIC METABOLIC PANEL
BUN: 31 mg/dL — AB (ref 4–21)
Creatinine: 1 mg/dL (ref <=1.1)
Glucose: 80 mg/dL
Potassium: 4.3 mmol/L (ref 3.4–5.3)
Sodium: 134 mmol/L — AB (ref 137–147)

## 2016-02-18 LAB — CBC AND DIFFERENTIAL
HCT: 32 % — AB (ref 36–46)
Hemoglobin: 10.4 g/dL — AB (ref 12.0–16.0)
WBC: 7 10^3/mL

## 2016-02-18 LAB — HEPATIC FUNCTION PANEL
ALT: 13 U/L (ref 7–35)
AST: 17 U/L (ref 13–35)
Alkaline Phosphatase: 62 U/L (ref 25–125)
Bilirubin, Total: 0.4 mg/dL

## 2016-02-19 ENCOUNTER — Encounter: Payer: Self-pay | Admitting: *Deleted

## 2016-03-04 ENCOUNTER — Ambulatory Visit (INDEPENDENT_AMBULATORY_CARE_PROVIDER_SITE_OTHER): Payer: Medicare Other | Admitting: Internal Medicine

## 2016-03-04 ENCOUNTER — Encounter: Payer: Self-pay | Admitting: Internal Medicine

## 2016-03-04 VITALS — BP 150/90 | HR 100 | Temp 99.0°F | Resp 20 | Ht 64.0 in | Wt 181.2 lb

## 2016-03-04 DIAGNOSIS — N183 Chronic kidney disease, stage 3 unspecified: Secondary | ICD-10-CM

## 2016-03-04 DIAGNOSIS — M1000 Idiopathic gout, unspecified site: Secondary | ICD-10-CM

## 2016-03-04 DIAGNOSIS — M069 Rheumatoid arthritis, unspecified: Secondary | ICD-10-CM | POA: Diagnosis not present

## 2016-03-04 DIAGNOSIS — I10 Essential (primary) hypertension: Secondary | ICD-10-CM | POA: Diagnosis not present

## 2016-03-04 DIAGNOSIS — M1990 Unspecified osteoarthritis, unspecified site: Secondary | ICD-10-CM | POA: Diagnosis not present

## 2016-03-04 DIAGNOSIS — J302 Other seasonal allergic rhinitis: Secondary | ICD-10-CM | POA: Diagnosis not present

## 2016-03-04 DIAGNOSIS — I5032 Chronic diastolic (congestive) heart failure: Secondary | ICD-10-CM

## 2016-03-04 DIAGNOSIS — Z0000 Encounter for general adult medical examination without abnormal findings: Secondary | ICD-10-CM | POA: Diagnosis not present

## 2016-03-04 DIAGNOSIS — E1129 Type 2 diabetes mellitus with other diabetic kidney complication: Secondary | ICD-10-CM

## 2016-03-04 MED ORDER — TRAMADOL HCL 50 MG PO TABS: ORAL_TABLET | ORAL | Status: DC

## 2016-03-04 MED ORDER — HYDRALAZINE HCL 25 MG PO TABS: 25.0000 mg | ORAL_TABLET | Freq: Four times a day (QID) | ORAL | Status: DC

## 2016-03-04 NOTE — Progress Notes (Signed)
+Patient ID: [name] [name], female   DOB: Jun 09, 1930, 82 y.o.   MRN: [id number]   Location:    [facility]   Place of Service:   OFFICE  Provider: DR Elmon Kirschner  Patient Care Team: Kirt Boys, DO as PCP - General (Internal Medicine)  Extended Emergency Contact Information Primary Emergency Contact: [name],[name] Address: [address]          [place], [place] [address] D[place] of [place] Home Phone: [phone] Mobile Phone: [phone] Relation: Daughter Secondary Emergency Contact: [name] [name] Address: [address]          [place], [place] [address] [place] of [place] Home Phone: [phone] Mobile Phone: [phone] Relation: Daughter  Goals of Care: Advanced Directive information Advanced Directives 10/09/2015  Does patient have an advance directive? Yes  Type of Advance Directive -  Copy of advanced directive(s) in chart? No - copy requested     Chief Complaint  Patient presents with  . Annual Exam    Annual Exam   . OTHER    Daughter in room with patient    HPI: Patient is a 82 y.o. female seen in today for an annual wellness exam.   seasonal allergy  - she has occasional congestion with scratchy throat, cough and occasional productive. No f/c.  She is taking allergy med as ordered  CHF - she was in hospital  In 2016 for A/C diastolic HF. Lasix adjusted. She does not f/u with cardio  She continues to take injection every other week  and MTX (4 weekly). No longer on humira and prednisone. She is mx by Rheum Dr Corliss Skains. Pain controlled on norco. She has hand pain today. She has AM stiffness lasting 4 hrs.  BP controlled on lisinopril, labetalol, hydralazine, prn clonidine. Readings fluctuate at home.  BS stable at home on metformin. No low BS reactions.   No gout attacks. She takes allopurinol    Depression screen [facility] 2/9 03/04/2016 07/03/2015 11/27/2014 09/26/2014 07/11/2013  Decreased Interest 0 0 0 0 0  Down, Depressed,  Hopeless 0 0 0 0 0  PHQ - 2 Score 0 0 0 0 0  Altered sleeping - - - - -  Tired, decreased energy - - - - -  Change in appetite - - - - -  Feeling bad or failure about yourself  - - - - -  Trouble concentrating - - - - -  Moving slowly or fidgety/restless - - - - -  Suicidal thoughts - - - - -  PHQ-9 Score - - - - -    Fall Risk  03/04/2016 10/09/2015 09/19/2015 09/09/2015 07/23/2015  Falls in the past year? No No No No Yes  Number falls in past yr: - - - - 1  Injury with Fall? - - - - No   MMSE - Mini Mental State Exam 03/04/2016  Not completed: (No Data)  Orientation to time 5  Orientation to Place 5  Registration 3  Attention/ Calculation 3  Recall 2  Language- name 2 objects 2  Language- repeat 1  Language- follow 3 step command 3  Language- read & follow direction 1  Write a sentence 1  Copy design 1  Total score 27     Health Maintenance  Topic Date Due  . TETANUS/TDAP  04/14/2015  . FOOT EXAM  06/06/2015  . OPHTHALMOLOGY EXAM  09/12/2015  . HEMOGLOBIN A1C  04/07/2016  . INFLUENZA VACCINE  06/09/2016  . PNA vac Low Risk  Adult (2 of 2 - PCV13) 09/11/2016  . DEXA SCAN  Completed  . ZOSTAVAX  Completed    Urinary incontinence? yes  Functional Status Survey: Is the patient deaf or have difficulty hearing?: No Does the patient have difficulty seeing, even when wearing glasses/contacts?: No Does the patient have difficulty concentrating, remembering, or making decisions?: No Does the patient have difficulty walking or climbing stairs?: Yes Does the patient have difficulty dressing or bathing?: Yes Does the patient have difficulty doing errands alone such as visiting a doctor's office or shopping?: Yes Exercise? Poor exercise tolerance due to joint pain  Diet? Eats 3 meals per day; makes healthy food choices  Vision Screening Comments: Patient says she was seen in November of 2016 by her eye doctor. Dr Viviann Spare . [place]  [phone] Dentition: no  dentures  Pain: controlled with meds   Past Medical History  Diagnosis Date  . Hypertension   . First degree atrioventricular block   . Onychia and paronychia of toe   . Muscle weakness (generalized)   . Allergic rhinitis due to pollen 04/27/2007  . Gout, unspecified 04/19/2013  . Disorder of bone and cartilage, unspecified   . Edema 05/03/2013  . Hyperlipidemia 04/27/2007  . Unspecified essential hypertension 08/08/2013  . Coronary atherosclerosis of native coronary artery   . Other malaise and fatigue   . Cough   . Other fall   . Unspecified vitamin D deficiency   . TIA (transient ischemic attack)     "a few one summer" (08/07/2013)  . Myocardial infarction (HCC) ~ 1970  . Exertional shortness of breath     "sometimes" (08/07/2013)  . Type II diabetes mellitus (HCC)     "fasting 90-110s" (08/07/2013)  . Osteoarthrosis, unspecified whether generalized or localized, unspecified site 04/27/2007  . Prepatellar bursitis   . Arthritis     "in q joint" (08/07/2013)  . Stroke (HCC) 01/17/2014  . Seizures (HCC)   . Muscle spasm     Past Surgical History  Procedure Laterality Date  . Tonsillectomy  08/1974  . Replacement total knee Right 09/2005  . Shoulder open rotator cuff repair Right 07/1999  . Appendectomy  1960  . Abdominal hysterectomy  04/1980  . Transthoracic echocardiogram  2003    EF 55-65%; mild concentric LVH  . Joint replacement    . Total knee arthroplasty Left 08/07/2013  . Cataract extraction w/ intraocular lens  implant, bilateral Bilateral ~ 2012  . Total knee arthroplasty Left 08/07/2013    Procedure: TOTAL KNEE ARTHROPLASTY- LEFT;  Surgeon: Dannielle Huh, MD;  Location: [facility] OR;  Service: Orthopedics;  Laterality: Left;  . Cardiac catheterization  2003   Family History  Problem Relation Age of Onset  . Heart attack Father      Social History   Social History  . Marital Status: Widowed    Spouse Name: N/A  . Number of Children: N/A  . Years of  Education: N/A   Occupational History  . Not on file.   Social History Main Topics  . Smoking status: Never Smoker   . Smokeless tobacco: Never Used  . Alcohol Use: No  . Drug Use: No  . Sexual Activity: No   Other Topics Concern  . Not on file   Social History Narrative   Widowed   Walks with cane    Allergies  Allergen Reactions  . Cetirizine Hcl Other (See Comments)    unknown  . Codeine Other (See Comments)    unknown  .  Fish Allergy   . Indomethacin Other (See Comments)    unknown  . Lyrica [Pregabalin]     hallucinations  . Methyldopa Other (See Comments)    unknown  . Shellfish Allergy Hives  . Tomato Rash      Medication List       This list is accurate as of: 03/04/16 11:59 PM.  Always use your most recent med list.               allopurinol 100 MG tablet  Commonly known as:  ZYLOPRIM  TAKE 1 TABLET BY MOUTH EVERY DAY FOR GOUT     AMBULATORY NON FORMULARY MEDICATION  Electric Hospital Bed Dx: I50.32, M06.09, M10.00, E11.29     aspirin EC 81 MG tablet  Take 81 mg by mouth daily.     benzonatate 100 MG capsule  Commonly known as:  TESSALON  take 2 capsules by mouth three times a day if needed for cough     calcium-vitamin D 500-200 MG-UNIT tablet  Commonly known as:  OSCAL WITH D  Take 1 tablet by mouth 2 (two) times daily.     cloNIDine 0.1 MG tablet  Commonly known as:  CATAPRES  Take 0.1 mg by mouth daily as needed (blood presure is above 160). For SBP>160     ferrous sulfate 325 (65 FE) MG tablet  Take 325 mg by mouth 2 (two) times daily with a meal.     folic acid 1 MG tablet  Commonly known as:  FOLVITE  Take 2 mg by mouth daily.     furosemide 20 MG tablet  Commonly known as:  LASIX  Take 1 tablet (20 mg total) by mouth daily.     glucose blood test strip  Commonly known as:  ONE TOUCH ULTRA TEST  Check blood sugar once daily DX 250.00     HUMIRA PEN 40 MG/0.8ML Pnkt  Generic drug:  Adalimumab  Inject 1 each as  directed every 14 (fourteen) days.     hydrALAZINE 25 MG tablet  Commonly known as:  APRESOLINE  Take 1 tablet (25 mg total) by mouth 4 (four) times daily.     labetalol 100 MG tablet  Commonly known as:  NORMODYNE  take 2 tablets by mouth twice a day     lisinopril 30 MG tablet  Commonly known as:  PRINIVIL,ZESTRIL  Take 1 tablet (30 mg total) by mouth daily.     metFORMIN 500 MG tablet  Commonly known as:  GLUCOPHAGE  Take one tablet by mouth once daily for diabetes     methotrexate 2.5 MG tablet  Commonly known as:  RHEUMATREX  Provided by rheumatology- started on 4 tablets every week for 2 weeks, then 6 tab every week for 2 weeks, then 8 tab every week     OVER THE COUNTER MEDICATION  Take 1 tablet by mouth 2 (two) times daily. Wellness formula for immune support     OVER THE COUNTER MEDICATION  Take 5 mLs by mouth every morning. Chloraphil liquid, mix in apply juice     oxycodone 5 MG capsule  Commonly known as:  OXY-IR  Take 1 capsule (5 mg total) by mouth every 4 (four) hours as needed for pain (Arthritis pain).     polyvinyl alcohol 1.4 % ophthalmic solution  Commonly known as:  LIQUIFILM TEARS  Place 1 drop into both eyes as needed (for dry eyes).     predniSONE 1 MG tablet  Commonly known as:  DELTASONE  Take 1 mg by mouth daily with breakfast.     traMADol 50 MG tablet  Commonly known as:  ULTRAM  take 1-2 tablets by mouth every 6 hours for MODERATE pain     triamcinolone cream 0.1 %  Commonly known as:  KENALOG  apply to affected area twice a day if needed for IRRITATION     vitamin C 500 MG tablet  Commonly known as:  ASCORBIC ACID  Take 500 mg by mouth daily.     Vitamin D-3 1000 units Caps  Take 1 capsule by mouth daily.         Review of Systems:  Review of Systems  HENT: Positive for postnasal drip, sinus pressure and sore throat.   Musculoskeletal: Positive for joint swelling, arthralgias and neck pain.  All other systems reviewed and  are negative.   Physical Exam: Filed Vitals:   03/04/16 1509  BP: 150/90  Pulse: 100  Temp: 99 F (37.2 C)  TempSrc: Oral  Resp: 20  Height: 5\' 4"  (1.626 m)  Weight: 181 lb 3.2 oz (82.192 kg)  SpO2: 97%   Body mass index is 31.09 kg/(m^2). Physical Exam  Constitutional: She is oriented to person, place, and time. She appears well-developed and well-nourished. No distress.  Looks well in NAD. Daughter present  HENT:  Head: Normocephalic and atraumatic.  Right Ear: Hearing, tympanic membrane, external ear and ear canal normal.  Left Ear: Hearing, tympanic membrane, external ear and ear canal normal.  Nose: Nose normal.  Mouth/Throat: Uvula is midline, oropharynx is clear and moist and mucous membranes are normal. She does not have dentures. No oropharyngeal exudate.  Eyes: Conjunctivae, EOM and lids are normal. Pupils are equal, round, and reactive to light. No scleral icterus.  Neck: Trachea normal and normal range of motion. Neck supple. Carotid bruit is not present. No tracheal deviation present. No thyroid mass and no thyromegaly present.  Cardiovascular: Normal rate, regular rhythm and intact distal pulses.  Exam reveals no gallop, no distant heart sounds and no friction rub.   Murmur heard.  Systolic murmur is present with a grade of 1/6  Trace LE edema b/l. No calf TTP  Pulmonary/Chest: Effort normal and breath sounds normal. No stridor. No respiratory distress. She has no wheezes. She has no rhonchi. She has no rales. Right breast exhibits no inverted nipple, no mass, no nipple discharge, no skin change and no tenderness. Left breast exhibits no inverted nipple, no mass, no nipple discharge, no skin change and no tenderness. Breasts are symmetrical.  Abdominal: Soft. Normal appearance, normal aorta and bowel sounds are normal. She exhibits no distension, no pulsatile midline mass and no mass. There is no hepatosplenomegaly or hepatomegaly. There is no tenderness. There is no  rigidity, no rebound and no guarding. No hernia.  Musculoskeletal: Normal range of motion. She exhibits edema and tenderness.  Multiple small, intermdte and large joint swelling with deformities. Gait unsteady and antalgic  Lymphadenopathy:       Head (right side): No posterior auricular adenopathy present.       Head (left side): No posterior auricular adenopathy present.    She has no cervical adenopathy.       Right: No supraclavicular adenopathy present.       Left: No supraclavicular adenopathy present.  Neurological: She is alert and oriented to person, place, and time. She has normal strength and normal reflexes. No cranial nerve deficit. Gait normal.  Skin: Skin is warm, dry and intact.  No rash noted. Nails show no clubbing.  Psychiatric: She has a normal mood and affect. Her speech is normal and behavior is normal. Judgment and thought content normal. Cognition and memory are normal.    Labs reviewed: Basic Metabolic Panel:  Recent Labs  34/19/37 1438  09/12/15 0433 09/19/15 1201 11/07/15 1116 02/18/16  NA 137  < > 136 136 137 134*  K 4.9  < > 3.8 4.1 4.1 4.3  CL 101  < > 103 99 104  --   CO2 20  < > 25 21 20   --   GLUCOSE 105*  < > 113* 86 87  --   BUN 20  < > 29* 15 19 31*  CREATININE 0.91  < > 0.97 0.86 0.82 1.0  CALCIUM 9.8  < > 9.1 9.7 9.4  --   TSH 1.420  --   --   --   --   --   < > = values in this interval not displayed. Liver Function Tests:  Recent Labs  07/23/15 1438  09/09/15 1210 09/11/15 0035 02/18/16  AST 16  < > 17 16 17   ALT 7  < > 11 11* 13  ALKPHOS 65  < > 71 58 62  BILITOT 0.3  --  0.5 0.6  --   PROT 7.2  --  7.2 7.4  --   ALBUMIN 3.9  --  3.9 3.6  --   < > = values in this interval not displayed.  Recent Labs  09/11/15 0035  LIPASE 30   No results for input(s): AMMONIA in the last 8760 hours. CBC:  Recent Labs  07/23/15 1438  09/09/15 09/09/15 1210 09/11/15 0035 09/11/15 0047 02/11/16 02/18/16  WBC 8.7  < > 7.6 7.6 8.5  --    --  7.0  NEUTROABS 5.4  --  5 4.6  --   --   --   --   HGB  --   < > 10.0*  --  9.3* 10.5*  --  10.4*  HCT 30.3*  < > 28* 28.0* 27.4* 31.0*  --  32*  MCV 92  --   --  86 91.9  --   --   --   PLT 365  < > 340 340 342  --  305  --   < > = values in this interval not displayed. Lipid Panel:  Recent Labs  07/03/15 1450 03/04/16 1620  CHOL 180 185  HDL 72 73  LDLCALC 95 89  TRIG 65 115  CHOLHDL 2.5 2.5   Lab Results  Component Value Date   HGBA1C 6.0* 10/09/2015    Procedures: No results found.   ECG OBTAINED AND REVIEWED BY MYSELF: SR @ 78 bpm, PACs/PVCs, poor R wave progression. NSST changes. LVH.  No acute ischemic changes. No change since 11/16.   Assessment/Plan    ICD-9-CM ICD-10-CM   1. Well adult exam V70.0 Z00.00   2. Osteoarthritis, unspecified osteoarthritis type, unspecified site 715.90 M19.90 traMADol (ULTRAM) 50 MG tablet  3. Chronic diastolic heart failure (HCC) 428.32 I50.32 EKG 12-Lead     Lipid Panel  4. Type 2 diabetes mellitus with other diabetic kidney complication (HCC) 250.40 E11.29 EKG 12-Lead     Lipid Panel     Hemoglobin A1c  5. Idiopathic gout, unspecified chronicity, unspecified site 274.9 M10.00   6. Rheumatoid arthritis involving multiple sites, unspecified rheumatoid factor presence (HCC) 714.0 M06.9   7. Essential hypertension 401.9 I10  8. Other seasonal allergic rhinitis 477.8 J30.2   9. CKD (chronic kidney disease) stage 3, GFR 30-59 ml/min 585.3 N18.3    Pt is UTD on health maintenance for age. Vaccinations are UTD. Pt maintains a healthy lifestyle. Encouraged pt to exercise 30-45 minutes 4-5 times per week. Eat a well balanced diet. Avoid smoking. Limit alcohol intake. Wear seatbelt when riding in the car. Wear sun block (SPF >50) when spending extended times outside.   Continue current medications as ordered  May change claritin to zyrtec or  allegra for better seasonal allergy control  Follow up with specialists as  scheduled  will call with lab results  Follow up in 3-4 mos for routine visit  Doy Taaffe S. Ancil Linsey  [facility] [address] [place], [place] [address] [phone] Cell (Monday-Friday 8 AM - 5 PM) [phone] After 5 PM and follow prompts

## 2016-03-04 NOTE — Patient Instructions (Addendum)
Encouraged her to exercise 30-45 minutes 4-5 times per week. Eat a well balanced diet. Avoid smoking. Limit alcohol intake. Wear seatbelt when riding in the car. Wear sun block (SPF >50) when spending extended times outside.  Continue current medications as ordered  Follow up with specialists as scheduled  will call with lab results  Follow up in 3-4 mos for routine visit

## 2016-03-05 LAB — LIPID PANEL
Chol/HDL Ratio: 2.5 ratio units (ref 0.0–4.4)
Cholesterol, Total: 185 mg/dL (ref 100–199)
HDL: 73 mg/dL (ref >39)
LDL Calculated: 89 mg/dL (ref 0–99)
Triglycerides: 115 mg/dL (ref 0–149)
VLDL Cholesterol Cal: 23 mg/dL (ref 5–40)

## 2016-03-05 LAB — HEMOGLOBIN A1C
Est. average glucose Bld gHb Est-mCnc: 123 mg/dL
Hgb A1c MFr Bld: 5.9 % — ABNORMAL HIGH (ref 4.8–5.6)

## 2016-03-13 ENCOUNTER — Other Ambulatory Visit: Payer: Self-pay | Admitting: *Deleted

## 2016-03-13 MED ORDER — ALLOPURINOL 100 MG PO TABS: ORAL_TABLET | ORAL | Status: DC

## 2016-03-13 NOTE — Telephone Encounter (Signed)
[facility] 

## 2016-04-30 ENCOUNTER — Ambulatory Visit (INDEPENDENT_AMBULATORY_CARE_PROVIDER_SITE_OTHER): Payer: Medicare Other | Admitting: Podiatry

## 2016-04-30 ENCOUNTER — Encounter: Payer: Self-pay | Admitting: Podiatry

## 2016-04-30 VITALS — BP 186/90 | HR 86

## 2016-04-30 DIAGNOSIS — M79606 Pain in leg, unspecified: Secondary | ICD-10-CM

## 2016-04-30 DIAGNOSIS — B351 Tinea unguium: Secondary | ICD-10-CM

## 2016-04-30 NOTE — Patient Instructions (Signed)
Seen for hypertrophic nails. All nails debrided. Return in 3 months or as needed.  

## 2016-04-30 NOTE — Progress Notes (Signed)
SUBJECTIVE: 82 y.o. year old female accompanied by her daughter presents requesting toe nails trimmed.  Contracted middle toes are very painful . Also wants to order diabetic shoes.   OBJECTIVE: DERMATOLOGIC EXAMINATION: Nails: Hypertrophic nails x 10. VASCULAR EXAMINATION OF LOWER LIMBS: Pedal pulses: All pedal pulses are palpable with normal pulsation.  No edema or erythema noted.  NEUROLOGIC EXAMINATION OF THE LOWER LIMBS: All epicritic and tactile sensations grossly intact.  MUSCULOSKELETAL EXAMINATION: Severe contracted 3rd digits bilateral painful.  Assessment: Onychomycosis x 10.  Hammer toe deformity 3rd bilateral painful in shoes.  PLAN: All nails debrided. Both feet measured for diabetic shoes

## 2016-05-20 ENCOUNTER — Other Ambulatory Visit: Payer: Self-pay | Admitting: *Deleted

## 2016-05-20 ENCOUNTER — Other Ambulatory Visit: Payer: Self-pay | Admitting: Internal Medicine

## 2016-05-20 MED ORDER — FUROSEMIDE 20 MG PO TABS: 20.0000 mg | ORAL_TABLET | Freq: Every day | ORAL | Status: DC

## 2016-05-20 NOTE — Telephone Encounter (Signed)
[facility] 

## 2016-05-28 ENCOUNTER — Other Ambulatory Visit: Payer: Self-pay | Admitting: Internal Medicine

## 2016-06-16 ENCOUNTER — Encounter: Payer: Self-pay | Admitting: Nurse Practitioner

## 2016-06-16 ENCOUNTER — Ambulatory Visit (INDEPENDENT_AMBULATORY_CARE_PROVIDER_SITE_OTHER): Payer: Medicare Other | Admitting: Nurse Practitioner

## 2016-06-16 VITALS — BP 138/84 | HR 69 | Temp 98.0°F | Resp 17 | Ht 64.0 in | Wt 177.2 lb

## 2016-06-16 DIAGNOSIS — L989 Disorder of the skin and subcutaneous tissue, unspecified: Secondary | ICD-10-CM

## 2016-06-16 NOTE — Progress Notes (Signed)
Careteam: Patient Care Team: Kirt Boys, DO as PCP - General (Internal Medicine)  Advanced Directive information Does patient have an advance directive?: No  Allergies  Allergen Reactions  . Cetirizine Hcl Other (See Comments)    unknown  . Codeine Other (See Comments)    unknown  . Fish Allergy   . Indomethacin Other (See Comments)    unknown  . Lyrica [Pregabalin]     hallucinations  . Methyldopa Other (See Comments)    unknown  . Shellfish Allergy Hives  . Tomato Rash    Chief Complaint  Patient presents with  . Acute Visit    Brownish/red bump on right leg since Sunday-non painful.      HPI: Patient is a 82 y.o. female seen in the office today due to 2 small bumps on right leg, one laterally above ankle and one to mid shin. hx of CHF, HTN, allergies, gout, DM. Places on her leg do not bother her. Noticed 3 days ago when her legs became slightly swollen. Swelling has gone down and bumps have gotten smaller since yesterday. No heat or drainage. Skin slightly darker where the bumps are located.  She has multiples moles and discoloration on her legs bilaterally.  No bites or injury  No fevers.  Review of Systems:  Review of Systems  Constitutional: Negative for activity change and appetite change.  Skin: Positive for color change (with bumps under skin).       See hpi    Past Medical History:  Diagnosis Date  . Allergic rhinitis due to pollen 04/27/2007  . Arthritis    "in q joint" (08/07/2013)  . Coronary atherosclerosis of native coronary artery   . Cough   . Disorder of bone and cartilage, unspecified   . Edema 05/03/2013  . Exertional shortness of breath    "sometimes" (08/07/2013)  . First degree atrioventricular block   . Gout, unspecified 04/19/2013  . Hyperlipidemia 04/27/2007  . Hypertension   . Muscle spasm   . Muscle weakness (generalized)   . Myocardial infarction (HCC) ~ 1970  . Onychia and paronychia of toe   . Osteoarthrosis,  unspecified whether generalized or localized, unspecified site 04/27/2007  . Other fall   . Other malaise and fatigue   . Prepatellar bursitis   . Seizures (HCC)   . Stroke (HCC) 01/17/2014  . TIA (transient ischemic attack)    "a few one summer" (08/07/2013)  . Type II diabetes mellitus (HCC)    "fasting 90-110s" (08/07/2013)  . Unspecified essential hypertension 08/08/2013  . Unspecified vitamin D deficiency    Past Surgical History:  Procedure Laterality Date  . ABDOMINAL HYSTERECTOMY  04/1980  . APPENDECTOMY  1960  . CARDIAC CATHETERIZATION  2003  . CATARACT EXTRACTION W/ INTRAOCULAR LENS  IMPLANT, BILATERAL Bilateral ~ 2012  . JOINT REPLACEMENT    . REPLACEMENT TOTAL KNEE Right 09/2005  . SHOULDER OPEN ROTATOR CUFF REPAIR Right 07/1999  . TONSILLECTOMY  08/1974  . TOTAL KNEE ARTHROPLASTY Left 08/07/2013  . TOTAL KNEE ARTHROPLASTY Left 08/07/2013   Procedure: TOTAL KNEE ARTHROPLASTY- LEFT;  Surgeon: Dannielle Huh, MD;  Location: [facility] OR;  Service: Orthopedics;  Laterality: Left;  . TRANSTHORACIC ECHOCARDIOGRAM  2003   EF 55-65%; mild concentric LVH   Social History:   reports that she has never smoked. She has never used smokeless tobacco. She reports that she does not drink alcohol or use drugs.  Family History  Problem Relation Age of Onset  . Heart  attack Father     Medications: [name] Medications  New Prescriptions   No medications on file  Previous Medications   ALLOPURINOL (ZYLOPRIM) 100 MG TABLET    Take one tablet by mouth once daily for gout   ASPIRIN EC 81 MG TABLET    Take 81 mg by mouth daily.   BENZONATATE (TESSALON) 100 MG CAPSULE    take 2 capsules by mouth three times a day if needed for cough   CALCIUM-VITAMIN D (OSCAL WITH D) 500-200 MG-UNIT PER TABLET    Take 1 tablet by mouth 2 (two) times daily.   CHOLECALCIFEROL (VITAMIN D-3) 1000 UNITS CAPS    Take 1 capsule by mouth daily.   CLONIDINE (CATAPRES) 0.1 MG TABLET    Take 0.1 mg by mouth daily as needed  (blood presure is above 160). For SBP>160   FERROUS SULFATE 325 (65 FE) MG TABLET    Take 325 mg by mouth 2 (two) times daily with a meal.   FOLIC ACID (FOLVITE) 1 MG TABLET    Take 2 mg by mouth daily.    FUROSEMIDE (LASIX) 20 MG TABLET    Take 1 tablet (20 mg total) by mouth daily.   GLUCOSE BLOOD (ONE TOUCH ULTRA TEST) TEST STRIP    Check blood sugar once daily DX 250.00   HYDRALAZINE (APRESOLINE) 25 MG TABLET    Take 1 tablet (25 mg total) by mouth 4 (four) times daily.   LABETALOL (NORMODYNE) 100 MG TABLET    take 2 tablets by mouth twice a day   LISINOPRIL (PRINIVIL,ZESTRIL) 30 MG TABLET    take 1 tablet by mouth once daily   METFORMIN (GLUCOPHAGE) 500 MG TABLET    take 1 tablet by mouth once daily for diabetes   ORENCIA CLICKJECT 125 MG/ML SOAJ    Inject 125 mg into the skin once a week.   OVER THE COUNTER MEDICATION    Take 1 tablet by mouth 2 (two) times daily. Wellness formula for immune support   OVER THE COUNTER MEDICATION    Take 5 mLs by mouth every morning. Chloraphil liquid, mix in apply juice   OXYCODONE (OXY-IR) 5 MG CAPSULE    Take 1 capsule (5 mg total) by mouth every 4 (four) hours as needed for pain (Arthritis pain).   POLYVINYL ALCOHOL (LIQUIFILM TEARS) 1.4 % OPHTHALMIC SOLUTION    Place 1 drop into both eyes as needed (for dry eyes).   PREDNISONE (DELTASONE) 1 MG TABLET    Take 1 mg by mouth daily with breakfast.    TRAMADOL (ULTRAM) 50 MG TABLET    take 1-2 tablets by mouth every 6 hours for MODERATE pain   TRIAMCINOLONE CREAM (KENALOG) 0.1 %    apply to affected area twice a day if needed for IRRITATION   VITAMIN C (ASCORBIC ACID) 500 MG TABLET    Take 500 mg by mouth daily.  Modified Medications   No medications on file  Discontinued Medications   AMBULATORY NON FORMULARY MEDICATION    Electric Hospital Bed Dx: I50.32, M06.09, M10.00, E11.29   HUMIRA PEN 40 MG/0.8ML PNKT    Inject 1 each as directed every 14 (fourteen) days.   METHOTREXATE (RHEUMATREX) 2.5 MG TABLET     Provided by rheumatology- started on 4 tablets every week for 2 weeks, then 6 tab every week for 2 weeks, then 8 tab every week     Physical Exam:  Vitals:   06/16/16 1311  BP: 138/84  Pulse: 69  Resp: 17  Temp: 98 F (36.7 C)  TempSrc: Oral  SpO2: 97%  Weight: 177 lb 3.2 oz (80.4 kg)  Height: 5\' 4"  (1.626 m)   Body mass index is 30.42 kg/m.  Physical Exam  Constitutional: She appears well-developed and well-nourished. No distress.  Cardiovascular: Normal rate, regular rhythm and normal heart sounds.   Pulmonary/Chest: Effort normal and breath sounds normal.  Skin: Skin is warm and dry. No rash noted. She is not diaphoretic. No erythema. No pallor.  2 small pea sized to right lower leg 1- to medial aspect above ankle and 1- mid shin, non-tender without heat or drainage    Labs reviewed: Basic Metabolic Panel:  Recent Labs  1438  09/12/15 0433 09/19/15 1201 11/07/15 1116 02/18/16  NA 137  < > 136 136 137 134*  K 4.9  < > 3.8 4.1 4.1 4.3  CL 101  < > 103 99 104  --   CO2 20  < > 25 21 20   --   GLUCOSE 105*  < > 113* 86 87  --   BUN 20  < > 29* 15 19 31*  CREATININE 0.91  < > 0.97 0.86 0.82 1.0  CALCIUM 9.8  < > 9.1 9.7 9.4  --   TSH 1.420  --   --   --   --   --   < > = values in this interval not displayed. Liver Function Tests:  Recent Labs  07/23/15 1438  09/09/15 1210 09/11/15 0035 02/18/16  AST 16  < > 17 16 17   ALT 7  < > 11 11* 13  ALKPHOS 65  < > 71 58 62  BILITOT 0.3  --  0.5 0.6  --   PROT 7.2  --  7.2 7.4  --   ALBUMIN 3.9  --  3.9 3.6  --   < > = values in this interval not displayed.  Recent Labs  09/11/15 0035  LIPASE 30   No results for input(s): AMMONIA in the last 8760 hours. CBC:  Recent Labs  07/23/15 1438  09/09/15 09/09/15 1210 09/11/15 0035 09/11/15 0047 02/11/16 02/18/16  WBC 8.7  < > 7.6 7.6 8.5  --   --  7.0  NEUTROABS 5.4  --  5 4.6  --   --   --   --   HGB  --   < > 10.0*  --  9.3* 10.5*  --  10.4*    HCT 30.3*  < > 28* 28.0* 27.4* 31.0*  --  32*  MCV 92  --   --  86 91.9  --   --   --   PLT 365  < > 340 340 342  --  305  --   < > = values in this interval not displayed. Lipid Panel:  Recent Labs  07/03/15 1450 03/04/16 1620  CHOL 180 185  HDL 72 73  LDLCALC 95 89  TRIG 65 115  CHOLHDL 2.5 2.5   TSH:  Recent Labs  07/23/15 1438  TSH 1.420   A1C: Lab Results  Component Value Date   HGBA1C 5.9 (H) 03/04/2016     Assessment/Plan 1. Bumps on skin No signs of infection, getting better per pt -possible sebaceous cyst may use warm compresses -to notify for redness, heat, pain, drainage or swelling  To keep follow up appt with Dr 03/06/16 K. 07/25/15  [facility] [phone] 8  am - 5 pm) [phone] (after hours)

## 2016-06-16 NOTE — Patient Instructions (Signed)
Watch bumps on leg Notify for redness, heat, pain, drainage, or swelling

## 2016-06-18 ENCOUNTER — Encounter: Payer: Self-pay | Admitting: Nurse Practitioner

## 2016-06-18 ENCOUNTER — Ambulatory Visit (INDEPENDENT_AMBULATORY_CARE_PROVIDER_SITE_OTHER): Payer: Medicare Other | Admitting: Nurse Practitioner

## 2016-06-18 VITALS — BP 128/86 | HR 81 | Temp 97.9°F | Resp 17 | Ht 64.0 in | Wt 177.4 lb

## 2016-06-18 DIAGNOSIS — M719 Bursopathy, unspecified: Secondary | ICD-10-CM

## 2016-06-18 MED ORDER — PREDNISONE 10 MG PO TABS: ORAL_TABLET | ORAL | 0 refills | Status: DC

## 2016-06-18 MED ORDER — PREDNISONE 10 MG PO TABS: 10.0000 mg | ORAL_TABLET | Freq: Every day | ORAL | 0 refills | Status: DC

## 2016-06-18 NOTE — Progress Notes (Signed)
Careteam: Patient Care Team: Kirt Boys, DO as PCP - General (Internal Medicine)  Advanced Directive information Does patient have an advance directive?: Yes, Type of Advance Directive: Healthcare Power of Attorney  Allergies  Allergen Reactions  . Cetirizine Hcl Other (See Comments)    unknown  . Codeine Other (See Comments)    unknown  . Fish Allergy   . Indomethacin Other (See Comments)    unknown  . Lyrica [Pregabalin]     hallucinations  . Methyldopa Other (See Comments)    unknown  . Shellfish Allergy Hives  . Tomato Rash    Chief Complaint  Patient presents with  . Acute Visit    Right groin pain that feels like pulled muscle. Right knee/leg swollen to foot.   . Other    Daughter in room with patient.      HPI: Patient is a 82 y.o. female seen in the office today due to groin pain.  Daughter came by last night and she was complaining of arthritis.  Last night around 7 oclock started feeling more pain in her right groin, went to lay down and when she went to wake up she was in such pain she could hardly move.  Felt like her leg was going to give way No fall or injury Took oxycodone last evening and in the middle of the night, after 2nd dose starting feeling relief. Hx of hamstring avulsion 2 years ago and daughter said it sounds the same way.  Sharp pain 9/10 to groin and around to side and down to knee. when she moved last night. Now it is a 3/10.  Took tramadol only this morning   Previously on methotrexate and prednisone due to RA but has been taken off that  Review of Systems:  Review of Systems  Constitutional: Negative for activity change, appetite change, chills, diaphoresis, fatigue and fever.  Respiratory: Negative for cough and shortness of breath.   Cardiovascular: Negative for chest pain, palpitations and leg swelling.  Gastrointestinal: Negative for constipation and diarrhea.  Musculoskeletal: Positive for arthralgias and gait problem  (using walker).  Neurological: Negative for dizziness, tremors, numbness and headaches.  Psychiatric/Behavioral: Negative for sleep disturbance. The patient is not nervous/anxious.     Past Medical History:  Diagnosis Date  . Allergic rhinitis due to pollen 04/27/2007  . Arthritis    "in q joint" (08/07/2013)  . Coronary atherosclerosis of native coronary artery   . Cough   . Disorder of bone and cartilage, unspecified   . Edema 05/03/2013  . Exertional shortness of breath    "sometimes" (08/07/2013)  . First degree atrioventricular block   . Gout, unspecified 04/19/2013  . Hyperlipidemia 04/27/2007  . Hypertension   . Muscle spasm   . Muscle weakness (generalized)   . Myocardial infarction (HCC) ~ 1970  . Onychia and paronychia of toe   . Osteoarthrosis, unspecified whether generalized or localized, unspecified site 04/27/2007  . Other fall   . Other malaise and fatigue   . Prepatellar bursitis   . Seizures (HCC)   . Stroke (HCC) 01/17/2014  . TIA (transient ischemic attack)    "a few one summer" (08/07/2013)  . Type II diabetes mellitus (HCC)    "fasting 90-110s" (08/07/2013)  . Unspecified essential hypertension 08/08/2013  . Unspecified vitamin D deficiency    Past Surgical History:  Procedure Laterality Date  . ABDOMINAL HYSTERECTOMY  04/1980  . APPENDECTOMY  1960  . CARDIAC CATHETERIZATION  2003  . CATARACT  EXTRACTION W/ INTRAOCULAR LENS  IMPLANT, BILATERAL Bilateral ~ 2012  . JOINT REPLACEMENT    . REPLACEMENT TOTAL KNEE Right 09/2005  . SHOULDER OPEN ROTATOR CUFF REPAIR Right 07/1999  . TONSILLECTOMY  08/1974  . TOTAL KNEE ARTHROPLASTY Left 08/07/2013  . TOTAL KNEE ARTHROPLASTY Left 08/07/2013   Procedure: TOTAL KNEE ARTHROPLASTY- LEFT;  Surgeon: Dannielle Huh, MD;  Location: [facility] OR;  Service: Orthopedics;  Laterality: Left;  . TRANSTHORACIC ECHOCARDIOGRAM  2003   EF 55-65%; mild concentric LVH   Social History:   reports that she has never smoked. She has never  used smokeless tobacco. She reports that she does not drink alcohol or use drugs.  Family History  Problem Relation Age of Onset  . Heart attack Father     Medications: [name] Medications  New Prescriptions   No medications on file  Previous Medications   ALLOPURINOL (ZYLOPRIM) 100 MG TABLET    Take one tablet by mouth once daily for gout   ASPIRIN EC 81 MG TABLET    Take 81 mg by mouth daily.   BENZONATATE (TESSALON) 100 MG CAPSULE    take 2 capsules by mouth three times a day if needed for cough   CALCIUM-VITAMIN D (OSCAL WITH D) 500-200 MG-UNIT PER TABLET    Take 1 tablet by mouth 2 (two) times daily.   CHOLECALCIFEROL (VITAMIN D-3) 1000 UNITS CAPS    Take 1 capsule by mouth daily.   CLONIDINE (CATAPRES) 0.1 MG TABLET    Take 0.1 mg by mouth daily as needed (blood presure is above 160). For SBP>160   FERROUS SULFATE 325 (65 FE) MG TABLET    Take 325 mg by mouth 2 (two) times daily with a meal.   FOLIC ACID (FOLVITE) 1 MG TABLET    Take 2 mg by mouth daily.    FUROSEMIDE (LASIX) 20 MG TABLET    Take 1 tablet (20 mg total) by mouth daily.   GLUCOSE BLOOD (ONE TOUCH ULTRA TEST) TEST STRIP    Check blood sugar once daily DX 250.00   HYDRALAZINE (APRESOLINE) 25 MG TABLET    Take 1 tablet (25 mg total) by mouth 4 (four) times daily.   LABETALOL (NORMODYNE) 100 MG TABLET    take 2 tablets by mouth twice a day   LISINOPRIL (PRINIVIL,ZESTRIL) 30 MG TABLET    take 1 tablet by mouth once daily   METFORMIN (GLUCOPHAGE) 500 MG TABLET    take 1 tablet by mouth once daily for diabetes   ORENCIA CLICKJECT 125 MG/ML SOAJ    Inject 125 mg into the skin once a week.   OVER THE COUNTER MEDICATION    Take 1 tablet by mouth 2 (two) times daily. Wellness formula for immune support   OVER THE COUNTER MEDICATION    Take 5 mLs by mouth every morning. Chloraphil liquid, mix in apply juice   OXYCODONE (OXY-IR) 5 MG CAPSULE    Take 1 capsule (5 mg total) by mouth every 4 (four) hours as needed for pain  (Arthritis pain).   POLYVINYL ALCOHOL (LIQUIFILM TEARS) 1.4 % OPHTHALMIC SOLUTION    Place 1 drop into both eyes as needed (for dry eyes).   TRAMADOL (ULTRAM) 50 MG TABLET    take 1-2 tablets by mouth every 6 hours for MODERATE pain   TRIAMCINOLONE CREAM (KENALOG) 0.1 %    apply to affected area twice a day if needed for IRRITATION   VITAMIN C (ASCORBIC ACID) 500 MG TABLET  Take 500 mg by mouth daily.  Modified Medications   No medications on file  Discontinued Medications   PREDNISONE (DELTASONE) 1 MG TABLET    Take 1 mg by mouth daily with breakfast.      Physical Exam:  Vitals:   06/18/16 1437  BP: 128/86  Pulse: 81  Resp: 17  Temp: 97.9 F (36.6 C)  TempSrc: Oral  SpO2: 95%  Weight: 177 lb 6.4 oz (80.5 kg)  Height: 5\' 4"  (1.626 m)   Body mass index is 30.45 kg/m.  Physical Exam  Constitutional: She is oriented to person, place, and time. She appears well-developed and well-nourished.  Cardiovascular: Normal rate, regular rhythm and normal heart sounds.   Pulmonary/Chest: Effort normal and breath sounds normal.  Musculoskeletal: She exhibits tenderness (over the right trochanter ). She exhibits no edema.       Right hip: She exhibits decreased range of motion and tenderness. She exhibits normal strength, no swelling, no crepitus, no deformity and no laceration.  Neurological: She is alert and oriented to person, place, and time.    Labs reviewed: Basic Metabolic Panel:  Recent Labs  1438  09/12/15 0433 09/19/15 1201 11/07/15 1116 02/18/16  NA 137  < > 136 136 137 134*  K 4.9  < > 3.8 4.1 4.1 4.3  CL 101  < > 103 99 104  --   CO2 20  < > 25 21 20   --   GLUCOSE 105*  < > 113* 86 87  --   BUN 20  < > 29* 15 19 31*  CREATININE 0.91  < > 0.97 0.86 0.82 1.0  CALCIUM 9.8  < > 9.1 9.7 9.4  --   TSH 1.420  --   --   --   --   --   < > = values in this interval not displayed. Liver Function Tests:  Recent Labs  07/23/15 1438  09/09/15 1210  09/11/15 0035 02/18/16  AST 16  < > 17 16 17   ALT 7  < > 11 11* 13  ALKPHOS 65  < > 71 58 62  BILITOT 0.3  --  0.5 0.6  --   PROT 7.2  --  7.2 7.4  --   ALBUMIN 3.9  --  3.9 3.6  --   < > = values in this interval not displayed.  Recent Labs  09/11/15 0035  LIPASE 30   No results for input(s): AMMONIA in the last 8760 hours. CBC:  Recent Labs  07/23/15 1438  09/09/15 09/09/15 1210 09/11/15 0035 09/11/15 0047 02/11/16 02/18/16  WBC 8.7  < > 7.6 7.6 8.5  --   --  7.0  NEUTROABS 5.4  --  5 4.6  --   --   --   --   HGB  --   < > 10.0*  --  9.3* 10.5*  --  10.4*  HCT 30.3*  < > 28* 28.0* 27.4* 31.0*  --  32*  MCV 92  --   --  86 91.9  --   --   --   PLT 365  < > 340 340 342  --  305  --   < > = values in this interval not displayed. Lipid Panel:  Recent Labs  07/03/15 1450 03/04/16 1620  CHOL 180 185  HDL 72 73  LDLCALC 95 89  TRIG 65 115  CHOLHDL 2.5 2.5   TSH:  Recent Labs  07/23/15 1438  TSH 1.420   A1C: Lab Results  Component Value Date   HGBA1C 5.9 (H) 03/04/2016     Assessment/Plan 1. Bursitis -to ice right bursa 20 mins 2-3 times daily - predniSONE (DELTASONE) 10 MG tablet; PREDNISONE 40 MG DAILY FOR 3 DAYS THEN DECREASED DAILY-- TO 30 MG, 20 MG, 10 MG AND 5 MG THEN DONE  Dispense: 19 tablet; Refill: 0  Keep follow up with Dr Montez Morita and as needed before  Janene Harvey. Biagio Borg  [facility] [phone] 8 am - 5 pm) [phone] (after hours)

## 2016-06-18 NOTE — Patient Instructions (Addendum)
PREDNISONE 40 MG DAILY FOR 3 DAYS THEN DECREASED DAILY-- TO 30 MG, 20 MG, 10 MG AND 5 MG THEN DONE  4 TABLETS FOR 3 DAYS THEN 3 TABLETS, 2 TABLETS, 1 TABLET, 1/2 TABLET   Bursitis Bursitis is inflammation and irritation of a bursa, which is one of the small, fluid-filled sacs that cushion and protect the moving parts of your body. These sacs are located between bones and muscles, muscle attachments, or skin areas next to bones. A bursa protects these structures from the wear and tear that results from frequent movement. An inflamed bursa causes pain and swelling. Fluid may build up inside the sac. Bursitis is most common near joints, especially the knees, elbows, hips, and shoulders. CAUSES Bursitis can be caused by:   Injury from:  A direct blow, like falling on your knee or elbow.  Overuse of a joint (repetitive stress).  Infection. This can happen if bacteria gets into a bursa through a cut or scrape near a joint.  Diseases that cause joint inflammation, such as gout and rheumatoid arthritis. RISK FACTORS You may be at risk for bursitis if you:   Have a job or hobby that involves a lot of repetitive stress on your joints.  Have a condition that weakens your body's defense system (immune system), such as diabetes, cancer, or HIV.  Lift and reach overhead often.  Kneel or lean on hard surfaces often.  Run or walk often. SIGNS AND SYMPTOMS The most common signs and symptoms of bursitis are:  Pain that gets worse when you move the affected body part or put weight on it.  Inflammation.  Stiffness. Other signs and symptoms may include:  Redness.  Tenderness.  Warmth.  Pain that continues after rest.  Fever and chills. This may occur in bursitis caused by infection. DIAGNOSIS Bursitis may be diagnosed by:   Medical history and physical exam.  MRI.  A procedure to drain fluid from the bursa with a needle (aspiration). The fluid may be checked for signs of infection  or gout.  Blood tests to rule out other causes of inflammation. TREATMENT  Bursitis can usually be treated at home with rest, ice, compression, and elevation (RICE). For mild bursitis, RICE treatment may be all you need. Other treatments may include:  Nonsteroidal anti-inflammatory drugs (NSAIDs) to treat pain and inflammation.  Corticosteroids to fight inflammation. You may have these drugs injected into and around the area of bursitis.  Aspiration of bursitis fluid to relieve pain and improve movement.  Antibiotic medicine to treat an infected bursa.  A splint, brace, or walking aid.  Physical therapy if you continue to have pain or limited movement.  Surgery to remove a damaged or infected bursa. This may be needed if you have a very bad case of bursitis or if other treatments have not worked. HOME CARE INSTRUCTIONS   Take medicines only as directed by your health care provider.  If you were prescribed an antibiotic medicine, finish it all even if you start to feel better.  Rest the affected area as directed by your health care provider.  Keep the area elevated.  Avoid activities that make pain worse.  Apply ice to the injured area:  Place ice in a plastic bag.  Place a towel between your skin and the bag.  Leave the ice on for 20 minutes, 2-3 times a day.  Use splints, braces, pads, or walking aids as directed by your health care provider.  Keep all follow-up visits as  directed by your health care provider. This is important. PREVENTION   Wear knee pads if you kneel often.  Wear sturdy running or walking shoes that fit you well.  Take regular breaks from repetitive activity.  Warm up by stretching before doing any strenuous activity.  Maintain a healthy weight or lose weight as recommended by your health care provider. Ask your health care provider if you need help.  Exercise regularly. Start any new physical activity gradually. SEEK MEDICAL CARE IF:    Your bursitis is not responding to treatment or home care.  You have a fever.  You have chills.   This information is not intended to replace advice given to you by your health care provider. Make sure you discuss any questions you have with your health care provider.   Document Released: 10/23/2000 Document Revised: 07/17/2015 Document Reviewed: 01/15/2014 Elsevier Interactive Patient Education [employer]

## 2016-06-22 ENCOUNTER — Other Ambulatory Visit: Payer: Self-pay | Admitting: Internal Medicine

## 2016-07-08 ENCOUNTER — Ambulatory Visit: Payer: Medicare Other | Admitting: Internal Medicine

## 2016-07-13 ENCOUNTER — Other Ambulatory Visit: Payer: Self-pay | Admitting: Internal Medicine

## 2016-07-14 NOTE — Telephone Encounter (Signed)
rx sent to pharmacy by e-script  

## 2016-07-21 ENCOUNTER — Encounter: Payer: Self-pay | Admitting: Nurse Practitioner

## 2016-07-21 ENCOUNTER — Ambulatory Visit (INDEPENDENT_AMBULATORY_CARE_PROVIDER_SITE_OTHER): Payer: Medicare Other | Admitting: Nurse Practitioner

## 2016-07-21 VITALS — BP 124/76 | HR 88 | Temp 98.3°F | Resp 18 | Ht 64.0 in | Wt 178.8 lb

## 2016-07-21 DIAGNOSIS — J302 Other seasonal allergic rhinitis: Secondary | ICD-10-CM

## 2016-07-21 DIAGNOSIS — M069 Rheumatoid arthritis, unspecified: Secondary | ICD-10-CM

## 2016-07-21 MED ORDER — PREDNISONE 20 MG PO TABS: 20.0000 mg | ORAL_TABLET | Freq: Every day | ORAL | 0 refills | Status: DC

## 2016-07-21 NOTE — Progress Notes (Signed)
Careteam: Patient Care Team: Kirt Boys, DO as PCP - General (Internal Medicine)  Advanced Directive information Does patient have an advance directive?: Yes, Type of Advance Directive: Healthcare Power of Attorney  Allergies  Allergen Reactions  . Cetirizine Hcl Other (See Comments)    unknown  . Codeine Other (See Comments)    unknown  . Fish Allergy   . Indomethacin Other (See Comments)    unknown  . Lyrica [Pregabalin]     hallucinations  . Methyldopa Other (See Comments)    unknown  . Shellfish Allergy Hives  . Tomato Rash    Chief Complaint  Patient presents with  . Acute Visit    Right side weakness since this weekend; extremely tired, SOB this morning for about 45 min  . Other    wants flu vaccine    HPI: Patient is a 82 y.o. female seen in the office today due to increase pain and joints in her hands swelling. Sees Dr Corliss Skains for RA, last was seen 3 weeks ago. There was no changes. Pt was was taken off methotrexate in July.  Pt reports pain throughout upper body, pain on right side of the body is worse. Decrease ROM due to increase in pain Also light headed with shortness of breath, Balance was off, very weak, Blood pressure was low (106/56) which is low for her. Overall these symptoms have improved since this morning  Eating and drinking like normal.  Reports cough no congestion. Has Increase in her allergies.   Review of Systems:  Review of Systems  Constitutional: Negative for activity change, appetite change, chills, diaphoresis, fatigue and fever.  Respiratory: Negative for cough and shortness of breath.   Cardiovascular: Negative for chest pain, palpitations and leg swelling.  Gastrointestinal: Negative for constipation and diarrhea.  Musculoskeletal: Positive for arthralgias, gait problem (using walker), joint swelling and myalgias.  Neurological: Positive for dizziness, weakness and light-headedness. Negative for tremors, numbness and  headaches.       Dizziness, lightheaded and weak this morning but has been better this afternoon  Psychiatric/Behavioral: Negative for sleep disturbance. The patient is not nervous/anxious.     Past Medical History:  Diagnosis Date  . Allergic rhinitis due to pollen 04/27/2007  . Arthritis    "in q joint" (08/07/2013)  . Coronary atherosclerosis of native coronary artery   . Cough   . Disorder of bone and cartilage, unspecified   . Edema 05/03/2013  . Exertional shortness of breath    "sometimes" (08/07/2013)  . First degree atrioventricular block   . Gout, unspecified 04/19/2013  . Hyperlipidemia 04/27/2007  . Hypertension   . Muscle spasm   . Muscle weakness (generalized)   . Myocardial infarction (HCC) ~ 1970  . Onychia and paronychia of toe   . Osteoarthrosis, unspecified whether generalized or localized, unspecified site 04/27/2007  . Other fall   . Other malaise and fatigue   . Prepatellar bursitis   . Seizures (HCC)   . Stroke (HCC) 01/17/2014  . TIA (transient ischemic attack)    "a few one summer" (08/07/2013)  . Type II diabetes mellitus (HCC)    "fasting 90-110s" (08/07/2013)  . Unspecified essential hypertension 08/08/2013  . Unspecified vitamin D deficiency    Past Surgical History:  Procedure Laterality Date  . ABDOMINAL HYSTERECTOMY  04/1980  . APPENDECTOMY  1960  . CARDIAC CATHETERIZATION  2003  . CATARACT EXTRACTION W/ INTRAOCULAR LENS  IMPLANT, BILATERAL Bilateral ~ 2012  . JOINT REPLACEMENT    .  REPLACEMENT TOTAL KNEE Right 09/2005  . SHOULDER OPEN ROTATOR CUFF REPAIR Right 07/1999  . TONSILLECTOMY  08/1974  . TOTAL KNEE ARTHROPLASTY Left 08/07/2013  . TOTAL KNEE ARTHROPLASTY Left 08/07/2013   Procedure: TOTAL KNEE ARTHROPLASTY- LEFT;  Surgeon: Dannielle Huh, MD;  Location: [facility] OR;  Service: Orthopedics;  Laterality: Left;  . TRANSTHORACIC ECHOCARDIOGRAM  2003   EF 55-65%; mild concentric LVH   Social History:   reports that she has never smoked. She has  never used smokeless tobacco. She reports that she does not drink alcohol or use drugs.  Family History  Problem Relation Age of Onset  . Heart attack Father     Medications: [name] Medications  New Prescriptions   No medications on file  Previous Medications   ALLOPURINOL (ZYLOPRIM) 100 MG TABLET    Take one tablet by mouth once daily for gout   ASPIRIN EC 81 MG TABLET    Take 81 mg by mouth daily.   BENZONATATE (TESSALON) 100 MG CAPSULE    take 2 capsules by mouth three times a day if needed for cough   CALCIUM-VITAMIN D (OSCAL WITH D) 500-200 MG-UNIT PER TABLET    Take 1 tablet by mouth 2 (two) times daily.   CHOLECALCIFEROL (VITAMIN D-3) 1000 UNITS CAPS    Take 1 capsule by mouth daily.   CLONIDINE (CATAPRES) 0.1 MG TABLET    Take 0.1 mg by mouth daily as needed (blood presure is above 160). For SBP>160   FERROUS SULFATE 325 (65 FE) MG TABLET    Take 325 mg by mouth 2 (two) times daily with a meal.   FOLIC ACID (FOLVITE) 1 MG TABLET    Take 2 mg by mouth daily.    FUROSEMIDE (LASIX) 20 MG TABLET    Take 1 tablet (20 mg total) by mouth daily.   GLUCOSE BLOOD (ONE TOUCH ULTRA TEST) TEST STRIP    Check blood sugar once daily DX 250.00   HYDRALAZINE (APRESOLINE) 25 MG TABLET    Take 1 tablet (25 mg total) by mouth 4 (four) times daily.   LABETALOL (NORMODYNE) 100 MG TABLET    take 2 tablet by mouth twice a day   LISINOPRIL (PRINIVIL,ZESTRIL) 30 MG TABLET    take 1 tablet by mouth once daily   METFORMIN (GLUCOPHAGE) 500 MG TABLET    take 1 tablet by mouth once daily for diabetes   ORENCIA CLICKJECT 125 MG/ML SOAJ    Inject 125 mg into the skin once a week.   OVER THE COUNTER MEDICATION    Take 1 tablet by mouth 2 (two) times daily. Wellness formula for immune support   OVER THE COUNTER MEDICATION    Take 5 mLs by mouth every morning. Chloraphil liquid, mix in apply juice   OXYCODONE (OXY-IR) 5 MG CAPSULE    Take 1 capsule (5 mg total) by mouth every 4 (four) hours as needed for pain  (Arthritis pain).   POLYVINYL ALCOHOL (LIQUIFILM TEARS) 1.4 % OPHTHALMIC SOLUTION    Place 1 drop into both eyes as needed (for dry eyes).   TRAMADOL (ULTRAM) 50 MG TABLET    take 1-2 tablets by mouth every 6 hours for MODERATE pain   TRIAMCINOLONE CREAM (KENALOG) 0.1 %    apply to affected area twice a day if needed for IRRITATION   VITAMIN C (ASCORBIC ACID) 500 MG TABLET    Take 500 mg by mouth daily.  Modified Medications   No medications on file  Discontinued Medications  PREDNISONE (DELTASONE) 10 MG TABLET    PREDNISONE 40 MG DAILY FOR 3 DAYS THEN DECREASED DAILY-- TO 30 MG, 20 MG, 10 MG AND 5 MG THEN DONE     Physical Exam:  Vitals:   07/21/16 1634  BP: 124/76  Pulse: 88  Resp: 18  Temp: 98.3 F (36.8 C)  TempSrc: Oral  SpO2: 97%  Weight: 178 lb 12.8 oz (81.1 kg)  Height: 5\' 4"  (1.626 m)   Body mass index is 30.69 kg/m.  Physical Exam  Constitutional: She is oriented to person, place, and time. She appears well-developed and well-nourished.  HENT:  Nose: Mucosal edema and rhinorrhea present.  Mouth/Throat: Oropharynx is clear and moist. No oropharyngeal exudate.  Cardiovascular: Normal rate, regular rhythm and normal heart sounds.   Pulmonary/Chest: Effort normal and breath sounds normal.  Musculoskeletal: She exhibits edema, tenderness and deformity.       Right hip: She exhibits decreased range of motion and tenderness. She exhibits normal strength, no swelling, no crepitus, no deformity and no laceration.  Swelling, tenderness with decrease ROM to MCP, PIP joints in bilateral hands, wrist, elbows, shoulders and right knee  Neurological: She is alert and oriented to person, place, and time.  Skin: Skin is warm and dry.    Labs reviewed: Basic Metabolic Panel:  Recent Labs  50/56/97 1438  09/12/15 0433 09/19/15 1201 11/07/15 1116 02/18/16  NA 137  < > 136 136 137 134*  K 4.9  < > 3.8 4.1 4.1 4.3  CL 101  < > 103 99 104  --   CO2 20  < > 25 21 20   --     GLUCOSE 105*  < > 113* 86 87  --   BUN 20  < > 29* 15 19 31*  CREATININE 0.91  < > 0.97 0.86 0.82 1.0  CALCIUM 9.8  < > 9.1 9.7 9.4  --   TSH 1.420  --   --   --   --   --   < > = values in this interval not displayed. Liver Function Tests:  Recent Labs  07/23/15 1438  09/09/15 1210 09/11/15 0035 02/18/16  AST 16  < > 17 16 17   ALT 7  < > 11 11* 13  ALKPHOS 65  < > 71 58 62  BILITOT 0.3  --  0.5 0.6  --   PROT 7.2  --  7.2 7.4  --   ALBUMIN 3.9  --  3.9 3.6  --   < > = values in this interval not displayed.  Recent Labs  09/11/15 0035  LIPASE 30   No results for input(s): AMMONIA in the last 8760 hours. CBC:  Recent Labs  07/23/15 1438  09/09/15 09/09/15 1210 09/11/15 0035 09/11/15 0047 02/11/16 02/18/16  WBC 8.7  < > 7.6 7.6 8.5  --   --  7.0  NEUTROABS 5.4  --  5 4.6  --   --   --   --   HGB  --   < > 10.0*  --  9.3* 10.5*  --  10.4*  HCT 30.3*  < > 28* 28.0* 27.4* 31.0*  --  32*  MCV 92  --   --  86 91.9  --   --   --   PLT 365  < > 340 340 342  --  305  --   < > = values in this interval not displayed. Lipid Panel:  Recent Labs  03/04/16  1620  CHOL 185  HDL 73  LDLCALC 89  TRIG 115  CHOLHDL 2.5   TSH:  Recent Labs  07/23/15 1438  TSH 1.420   A1C: Lab Results  Component Value Date   HGBA1C 5.9 (H) 03/04/2016     Assessment/Plan 1. Rheumatoid arthritis involving multiple sites, unspecified rheumatoid factor presence (HCC) -to contact her rheumatologist due to acute flare - predniSONE (DELTASONE) 20 MG tablet; Take 1 tablet (20 mg total) by mouth daily with breakfast.  Dispense: 14 tablet; Refill: 0  2. Other seasonal allergic rhinitis loratadine 10 mg daily   Markia Kyer K. Biagio Borg  [facility] [phone] 8 am - 5 pm) [phone] (after hours)

## 2016-07-21 NOTE — Patient Instructions (Addendum)
To start prednisone 20 mg daily until you get to your rheumatologist  To use Claritin (loratadine) 10 mg daily for allergies   Keep follow up with Dr Montez Morita

## 2016-07-22 ENCOUNTER — Telehealth: Payer: Self-pay | Admitting: *Deleted

## 2016-07-22 DIAGNOSIS — E1121 Type 2 diabetes mellitus with diabetic nephropathy: Secondary | ICD-10-CM

## 2016-07-22 NOTE — Telephone Encounter (Signed)
Patient daughter, [name] [name] called and left message on voicemail and stated that patient saw rheumatologist today and they started her on Methotrexate and Prednisone. The Rheumatologist also wanted her to let you know that patient's blood sugar was running high.

## 2016-07-22 NOTE — Telephone Encounter (Signed)
Noted. She needs to have lab appt for A1c and routine f/u if she is not scheduled for one already

## 2016-07-24 NOTE — Telephone Encounter (Signed)
LMOM to return call.

## 2016-07-24 NOTE — Telephone Encounter (Signed)
Patient daughter, Para March notified and agreed. Lab appointment scheduled for 9/19

## 2016-07-28 ENCOUNTER — Other Ambulatory Visit: Payer: Medicare Other

## 2016-07-30 ENCOUNTER — Ambulatory Visit (INDEPENDENT_AMBULATORY_CARE_PROVIDER_SITE_OTHER): Payer: Medicare Other | Admitting: Podiatry

## 2016-07-30 ENCOUNTER — Other Ambulatory Visit: Payer: Medicare Other

## 2016-07-30 ENCOUNTER — Encounter: Payer: Self-pay | Admitting: Podiatry

## 2016-07-30 DIAGNOSIS — M79673 Pain in unspecified foot: Secondary | ICD-10-CM | POA: Diagnosis not present

## 2016-07-30 DIAGNOSIS — M2040 Other hammer toe(s) (acquired), unspecified foot: Secondary | ICD-10-CM

## 2016-07-30 DIAGNOSIS — E1121 Type 2 diabetes mellitus with diabetic nephropathy: Secondary | ICD-10-CM

## 2016-07-30 DIAGNOSIS — B351 Tinea unguium: Secondary | ICD-10-CM

## 2016-07-30 NOTE — Patient Instructions (Signed)
Seen for hypertrophic nails. All nails debrided. Both feet measured for diabetic shoes. Return in 3 months or as needed.  

## 2016-07-30 NOTE — Progress Notes (Signed)
SUBJECTIVE: 82 y.o. year old female accompanied by her daughter presents requesting toe nails trimmed.  Contracted middle toes are very painful to walk. Also wants to order diabetic shoes.   OBJECTIVE: DERMATOLOGIC EXAMINATION: Nails: Hypertrophic nails x 10. VASCULAR EXAMINATION OF LOWER LIMBS: All pedal pulses are palpable with normal pulsation.  No edema or erythema noted.  NEUROLOGIC EXAMINATION OF THE LOWER LIMBS: All epicritic and tactile sensations grossly intact.  MUSCULOSKELETAL EXAMINATION: Severe contracted 3rd digits at PIPJ and DIPJ curling into sulcus area bilateral painful with weight bearing.  Assessment: Onychomycosis x 10.  Hammer toe deformity 3rd bilateral pain with ambulation.  PLAN: All nails debrided. Both feet measured for diabetic shoes

## 2016-07-31 LAB — HEMOGLOBIN A1C
Hgb A1c MFr Bld: 5.6 % (ref <5.7)
Mean Plasma Glucose: 114 mg/dL

## 2016-08-07 ENCOUNTER — Ambulatory Visit: Payer: Medicare Other | Admitting: Internal Medicine

## 2016-09-01 ENCOUNTER — Other Ambulatory Visit: Payer: Self-pay | Admitting: Rheumatology

## 2016-09-01 DIAGNOSIS — Z79899 Other long term (current) drug therapy: Secondary | ICD-10-CM

## 2016-09-01 DIAGNOSIS — Z79631 Long term (current) use of antimetabolite agent: Secondary | ICD-10-CM

## 2016-09-02 ENCOUNTER — Ambulatory Visit: Payer: Self-pay | Admitting: Internal Medicine

## 2016-09-02 ENCOUNTER — Other Ambulatory Visit: Payer: Self-pay | Admitting: *Deleted

## 2016-09-02 DIAGNOSIS — M1990 Unspecified osteoarthritis, unspecified site: Secondary | ICD-10-CM

## 2016-09-02 LAB — COMPLETE METABOLIC PANEL WITH GFR
ALT: 8 U/L (ref 6–29)
AST: 17 U/L (ref 10–35)
Albumin: 3.7 g/dL (ref 3.6–5.1)
Alkaline Phosphatase: 64 U/L (ref 33–130)
BUN: 18 mg/dL (ref 7–25)
CO2: 21 mmol/L (ref 20–31)
Calcium: 9.3 mg/dL (ref 8.6–10.4)
Chloride: 103 mmol/L (ref 98–110)
Creat: 1.06 mg/dL — ABNORMAL HIGH (ref 0.60–0.88)
GFR, Est African American: 56 mL/min — ABNORMAL LOW (ref >=60)
GFR, Est Non African American: 48 mL/min — ABNORMAL LOW (ref >=60)
Glucose, Bld: 104 mg/dL — ABNORMAL HIGH (ref 65–99)
Potassium: 4.3 mmol/L (ref 3.5–5.3)
Sodium: 137 mmol/L (ref 135–146)
Total Bilirubin: 0.4 mg/dL (ref 0.2–1.2)
Total Protein: 6.9 g/dL (ref 6.1–8.1)

## 2016-09-02 MED ORDER — OXYCODONE HCL 5 MG PO CAPS: 5.0000 mg | ORAL_CAPSULE | ORAL | 0 refills | Status: DC | PRN

## 2016-09-02 NOTE — Telephone Encounter (Signed)
Patient daughter requested and will pick up.  Patient is feeling much better today and wants to cancel the appointment scheduled.

## 2016-09-03 ENCOUNTER — Telehealth: Payer: Self-pay | Admitting: Radiology

## 2016-09-03 NOTE — Telephone Encounter (Signed)
-----   Message from Pollyann Savoy, MD sent at 09/02/2016 12:48 PM EDT ----- OK to continue MTX

## 2016-09-03 NOTE — Telephone Encounter (Signed)
Called her daughter to advise labs stable, continue current tment

## 2016-09-14 ENCOUNTER — Other Ambulatory Visit: Payer: Self-pay | Admitting: Dermatology

## 2016-09-16 ENCOUNTER — Encounter: Payer: Self-pay | Admitting: Internal Medicine

## 2016-09-16 ENCOUNTER — Ambulatory Visit (INDEPENDENT_AMBULATORY_CARE_PROVIDER_SITE_OTHER): Payer: Medicare Other | Admitting: Internal Medicine

## 2016-09-16 VITALS — BP 150/90 | HR 86 | Temp 98.0°F | Ht 64.0 in | Wt 179.6 lb

## 2016-09-16 DIAGNOSIS — M069 Rheumatoid arthritis, unspecified: Secondary | ICD-10-CM | POA: Diagnosis not present

## 2016-09-16 DIAGNOSIS — I10 Essential (primary) hypertension: Secondary | ICD-10-CM | POA: Diagnosis not present

## 2016-09-16 DIAGNOSIS — M1990 Unspecified osteoarthritis, unspecified site: Secondary | ICD-10-CM | POA: Diagnosis not present

## 2016-09-16 DIAGNOSIS — N183 Chronic kidney disease, stage 3 unspecified: Secondary | ICD-10-CM

## 2016-09-16 DIAGNOSIS — Z23 Encounter for immunization: Secondary | ICD-10-CM | POA: Diagnosis not present

## 2016-09-16 DIAGNOSIS — M1000 Idiopathic gout, unspecified site: Secondary | ICD-10-CM | POA: Diagnosis not present

## 2016-09-16 DIAGNOSIS — E1121 Type 2 diabetes mellitus with diabetic nephropathy: Secondary | ICD-10-CM

## 2016-09-16 DIAGNOSIS — I5032 Chronic diastolic (congestive) heart failure: Secondary | ICD-10-CM | POA: Diagnosis not present

## 2016-09-16 MED ORDER — TRAMADOL HCL 50 MG PO TABS: ORAL_TABLET | ORAL | 3 refills | Status: DC

## 2016-09-16 NOTE — Progress Notes (Signed)
Patient ID: [name] [name], female   DOB: [birth year], 82 y.o.   MRN: [id number]    Location:  [facility] Place of Service: OFFICE  Chief Complaint  Patient presents with  . Medical Management of Chronic Issues    4 months routine Visit  . Flu Vaccine    requested    HPI:  82 yo female seen today for f/u. She had a bx of right leg lesion by dermatology earlier this month. She reports increased pain today due to rainy weather and has been taking more oxy. She is out of tramadol x few days. Her daughter is present  seasonal allergy  - she has occasional congestion with scratchy throat, cough and occasional productive. No f/c.  She is taking allergy med as ordered  CHF - she was in hospital  In 2016 for A/C diastolic HF. Lasix adjusted. She does not f/u with cardio  RA - She continues to take injection every other week  and MTX (4 weekly). No longer on humira and prednisone. She is mx by Rheum Dr Corliss Skains. Pain controlled on norco. She has hand pain today. She has AM stiffness lasting 4 hrs.  BP controlled on lisinopril, labetalol, hydralazine, prn clonidine. Readings fluctuate at home.  BS stable at home on metformin. No low BS reactions.   No gout attacks. She takes allopurinol   Past Medical History:  Diagnosis Date  . Allergic rhinitis due to pollen 04/27/2007  . Arthritis    "in q joint" (08/07/2013)  . Coronary atherosclerosis of native coronary artery   . Cough   . Disorder of bone and cartilage, unspecified   . Edema 05/03/2013  . Exertional shortness of breath    "sometimes" (08/07/2013)  . First degree atrioventricular block   . Gout, unspecified 04/19/2013  . Hyperlipidemia 04/27/2007  . Hypertension   . Muscle spasm   . Muscle weakness (generalized)   . Myocardial infarction ~ 1970  . Onychia and paronychia of toe   . Osteoarthrosis, unspecified whether generalized or localized, unspecified site 04/27/2007  . Other fall   . Other malaise and fatigue   .  Prepatellar bursitis   . Seizures (HCC)   . Stroke (HCC) 01/17/2014  . TIA (transient ischemic attack)    "a few one summer" (08/07/2013)  . Type II diabetes mellitus (HCC)    "fasting 90-110s" (08/07/2013)  . Unspecified essential hypertension 08/08/2013  . Unspecified vitamin D deficiency     Past Surgical History:  Procedure Laterality Date  . ABDOMINAL HYSTERECTOMY  04/1980  . APPENDECTOMY  1960  . CARDIAC CATHETERIZATION  2003  . CATARACT EXTRACTION W/ INTRAOCULAR LENS  IMPLANT, BILATERAL Bilateral ~ 2012  . JOINT REPLACEMENT    . REPLACEMENT TOTAL KNEE Right 09/2005  . SHOULDER OPEN ROTATOR CUFF REPAIR Right 07/1999  . TONSILLECTOMY  08/1974  . TOTAL KNEE ARTHROPLASTY Left 08/07/2013  . TOTAL KNEE ARTHROPLASTY Left 08/07/2013   Procedure: TOTAL KNEE ARTHROPLASTY- LEFT;  Surgeon: Dannielle Huh, MD;  Location: [facility] OR;  Service: Orthopedics;  Laterality: Left;  . TRANSTHORACIC ECHOCARDIOGRAM  2003   EF 55-65%; mild concentric LVH    Patient Care Team: Kirt Boys, DO as PCP - General (Internal Medicine)  Social History   Social History  . Marital status: Widowed    Spouse name: N/A  . Number of children: N/A  . Years of education: N/A   Occupational History  . Not on file.   Social History Main Topics  . Smoking status: Never  Smoker  . Smokeless tobacco: Never Used  . Alcohol use No  . Drug use: No  . Sexual activity: No   Other Topics Concern  . Not on file   Social History Narrative   Widowed   Walks with cane     reports that she has never smoked. She has never used smokeless tobacco. She reports that she does not drink alcohol or use drugs.  Family History  Problem Relation Age of Onset  . Heart attack Father    Family Status  Relation Status  . Mother Deceased  . Father Deceased at age 63   cardiac arrest  . Daughter Alive  . Daughter Alive  . Daughter Alive     Allergies  Allergen Reactions  . Cetirizine Hcl Other (See Comments)     unknown  . Codeine Other (See Comments)    unknown  . Fish Allergy   . Indomethacin Other (See Comments)    unknown  . Lyrica [Pregabalin]     hallucinations  . Methyldopa Other (See Comments)    unknown  . Shellfish Allergy Hives  . Tomato Rash    Medications: [name] Medications  New Prescriptions   No medications on file  Previous Medications   ALLOPURINOL (ZYLOPRIM) 100 MG TABLET    Take one tablet by mouth once daily for gout   ASPIRIN EC 81 MG TABLET    Take 81 mg by mouth daily.   BENZONATATE (TESSALON) 100 MG CAPSULE    take 2 capsules by mouth three times a day if needed for cough   CALCIUM-VITAMIN D (OSCAL WITH D) 500-200 MG-UNIT PER TABLET    Take 1 tablet by mouth 2 (two) times daily.   CHOLECALCIFEROL (VITAMIN D-3) 1000 UNITS CAPS    Take 1 capsule by mouth daily.   CLONIDINE (CATAPRES) 0.1 MG TABLET    Take 0.1 mg by mouth daily as needed (blood presure is above 160). For SBP>160   FERROUS SULFATE 325 (65 FE) MG TABLET    Take 325 mg by mouth 2 (two) times daily with a meal.   FOLIC ACID (FOLVITE) 1 MG TABLET    Take 2 mg by mouth daily.    FUROSEMIDE (LASIX) 20 MG TABLET    Take 1 tablet (20 mg total) by mouth daily.   GLUCOSE BLOOD (ONE TOUCH ULTRA TEST) TEST STRIP    Check blood sugar once daily DX 250.00   HYDRALAZINE (APRESOLINE) 25 MG TABLET    Take 1 tablet (25 mg total) by mouth 4 (four) times daily.   LABETALOL (NORMODYNE) 100 MG TABLET    take 2 tablet by mouth twice a day   LISINOPRIL (PRINIVIL,ZESTRIL) 30 MG TABLET    take 1 tablet by mouth once daily   METFORMIN (GLUCOPHAGE) 500 MG TABLET    take 1 tablet by mouth once daily for diabetes   ORENCIA CLICKJECT 125 MG/ML SOAJ    Inject 125 mg into the skin once a week.   OVER THE COUNTER MEDICATION    Take 1 tablet by mouth 2 (two) times daily. Wellness formula for immune support   OVER THE COUNTER MEDICATION    Take 5 mLs by mouth every morning. Chloraphil liquid, mix in apply juice   OXYCODONE (OXY-IR) 5  MG CAPSULE    Take 1 capsule (5 mg total) by mouth every 4 (four) hours as needed for pain (Arthritis pain).   POLYVINYL ALCOHOL (LIQUIFILM TEARS) 1.4 % OPHTHALMIC SOLUTION    Place 1  drop into both eyes as needed (for dry eyes).   PREDNISONE (DELTASONE) 20 MG TABLET    Take 1 tablet (20 mg total) by mouth daily with breakfast.   TRAMADOL (ULTRAM) 50 MG TABLET    take 1-2 tablets by mouth every 6 hours for MODERATE pain   TRIAMCINOLONE CREAM (KENALOG) 0.1 %    apply to affected area twice a day if needed for IRRITATION   VITAMIN C (ASCORBIC ACID) 500 MG TABLET    Take 500 mg by mouth daily.  Modified Medications   No medications on file  Discontinued Medications   No medications on file    Review of Systems  Constitutional: Negative for activity change, appetite change, chills, diaphoresis, fatigue and fever.  HENT: Positive for congestion, postnasal drip and sinus pressure. Negative for ear pain and sore throat.   Eyes: Negative for visual disturbance.  Respiratory: Negative for cough, chest tightness and shortness of breath.   Cardiovascular: Negative for chest pain, palpitations and leg swelling.  Gastrointestinal: Negative for abdominal pain, blood in stool, constipation, diarrhea, nausea and vomiting.  Genitourinary: Negative for dysuria.  Musculoskeletal: Positive for arthralgias, back pain, gait problem (using cane) and joint swelling.  Skin: Positive for wound.  Neurological: Negative for dizziness, tremors, numbness and headaches.  Psychiatric/Behavioral: Negative for sleep disturbance. The patient is not nervous/anxious.   All other systems reviewed and are negative.   Vitals:   09/16/16 1152  BP: (!) 150/90  Pulse: 86  Temp: 98 F (36.7 C)  TempSrc: Oral  SpO2: 97%  Weight: 179 lb 9.6 oz (81.5 kg)  Height: 5\' 4"  (1.626 m)   Body mass index is 30.83 kg/m.  Physical Exam  Constitutional: She is oriented to person, place, and time. She appears well-developed and  well-nourished. No distress.  Looks well in NAD. Daughter present  HENT:  Head: Normocephalic and atraumatic.  Right Ear: Hearing, tympanic membrane, external ear and ear canal normal.  Left Ear: Hearing, tympanic membrane, external ear and ear canal normal.  Nose: Nose normal.  Mouth/Throat: Uvula is midline, oropharynx is clear and moist and mucous membranes are normal. She does not have dentures. No oropharyngeal exudate.  Eyes: Conjunctivae, EOM and lids are normal. Pupils are equal, round, and reactive to light. No scleral icterus.  Neck: Trachea normal and normal range of motion. Neck supple. Carotid bruit is not present. No tracheal deviation present. No thyroid mass and no thyromegaly present.  Cardiovascular: Normal rate, regular rhythm and intact distal pulses.  Exam reveals no gallop, no distant heart sounds and no friction rub.   Murmur heard.  Systolic murmur is present with a grade of 1/6  +1 pitting LE edema b/l. No calf TTP  Pulmonary/Chest: Effort normal and breath sounds normal. No stridor. No respiratory distress. She has no wheezes. She has no rhonchi. She has no rales. Right breast exhibits no inverted nipple, no mass, no nipple discharge, no skin change and no tenderness. Left breast exhibits no inverted nipple, no mass, no nipple discharge, no skin change and no tenderness. Breasts are symmetrical.  Abdominal: Soft. Normal appearance, normal aorta and bowel sounds are normal. She exhibits no distension, no pulsatile midline mass and no mass. There is no hepatosplenomegaly or hepatomegaly. There is no tenderness. There is no rigidity, no rebound and no guarding. No hernia.  Musculoskeletal: Normal range of motion. She exhibits edema and tenderness.  Multiple small, intermdte and large joint swelling with deformities. Gait unsteady and antalgic  Lymphadenopathy:  Head (right side): No posterior auricular adenopathy present.       Head (left side): No posterior auricular  adenopathy present.    She has no cervical adenopathy.       Right: No supraclavicular adenopathy present.       Left: No supraclavicular adenopathy present.  Neurological: She is alert and oriented to person, place, and time. She has normal strength. No cranial nerve deficit. Gait (unsteady and antalgic. uses cane to ambulate) abnormal.  Skin: Skin is warm, dry and intact. No rash noted. Nails show no clubbing.  Small right anterior leg bx site with no signs of secondary infection. Suture intact. No bleeding or d/c  Psychiatric: She has a normal mood and affect. Her speech is normal and behavior is normal. Judgment and thought content normal. Cognition and memory are normal.     Labs reviewed: Lab on 09/01/2016  Component Date Value Ref Range Status  . Sodium 09/02/2016 137  135 - 146 mmol/L Final  . Potassium 09/02/2016 4.3  3.5 - 5.3 mmol/L Final  . Chloride 09/02/2016 103  98 - 110 mmol/L Final  . CO2 09/02/2016 21  20 - 31 mmol/L Final  . Glucose, Bld 09/02/2016 104* 65 - 99 mg/dL Final  . BUN 16/08/9603 18  7 - 25 mg/dL Final  . Creat 54/07/8118 1.06* 0.60 - 0.88 mg/dL Final   Comment:   For patients > or = 80 years of age: The upper reference limit for Creatinine is approximately 13% higher for people identified as African-American.     . Total Bilirubin 09/02/2016 0.4  0.2 - 1.2 mg/dL Final  . Alkaline Phosphatase 09/02/2016 64  33 - 130 U/L Final  . AST 09/02/2016 17  10 - 35 U/L Final  . ALT 09/02/2016 8  6 - 29 U/L Final  . Total Protein 09/02/2016 6.9  6.1 - 8.1 g/dL Final  . Albumin 14/78/2956 3.7  3.6 - 5.1 g/dL Final  . Calcium 21/30/8657 9.3  8.6 - 10.4 mg/dL Final  . GFR, Est African American 09/02/2016 56* >=60 mL/min Final  . GFR, Est Non African American 09/02/2016 48* >=60 mL/min Final  Appointment on 07/30/2016  Component Date Value Ref Range Status  . Hgb A1c MFr Bld 07/31/2016 5.6  <5.7 % Final   Comment:   For the purpose of screening for the presence  of diabetes:   <5.7%       Consistent with the absence of diabetes 5.7-6.4 %   Consistent with increased risk for diabetes (prediabetes) >=6.5 %     Consistent with diabetes   This assay result is consistent with a decreased risk of diabetes.   Currently, no consensus exists regarding use of hemoglobin A1c for diagnosis of diabetes in children.   According to American Diabetes Association (ADA) guidelines, hemoglobin A1c <7.0% represents optimal control in non-pregnant diabetic patients. Different metrics may apply to specific patient populations. Standards of Medical Care in Diabetes (ADA).     . Mean Plasma Glucose 07/31/2016 114  mg/dL Final    No results found.   Assessment/Plan   ICD-9-CM ICD-10-CM   1. Osteoarthritis, unspecified osteoarthritis type, unspecified site 715.90 M19.90 traMADol (ULTRAM) 50 MG tablet  2. Rheumatoid arthritis involving multiple sites, unspecified rheumatoid factor presence (HCC) 714.0 M06.9   3. Type 2 diabetes mellitus with diabetic nephropathy, without long-term current use of insulin (HCC) 250.40 E11.21    583.81    4. Chronic diastolic heart failure (HCC) 428.32 I50.32  5. Idiopathic gout, unspecified chronicity, unspecified site 274.9 M10.00   6. Essential hypertension 401.9 I10   7. CKD (chronic kidney disease) stage 3, GFR 30-59 ml/min 585.3 N18.3   8. Encounter for immunization Z23 Z23 Flu Vaccine QUAD 36+ mos IM   Continue current medications as ordered  Flu shot given today  Follow up with specialists as scheduled  follow up in 4 mos for routine visit.     Maryclaire Stoecker S. Ancil Linsey  [facility] [address] [place], [place] [address] [phone] Cell (Monday-Friday 8 AM - 5 PM) [phone] After 5 PM and follow prompts

## 2016-09-16 NOTE — Patient Instructions (Signed)
Continue current medications as ordered  Flu shot given today  Follow up with specialists as scheduled  follow up in 4 mos for routine visit.

## 2016-09-18 NOTE — Progress Notes (Signed)
Office Visit Note  Patient: [name] [name]             Date of Birth: Nov 20, 1929           MRN: [id number]             PCP: Kirt Boys, DO Referring: Kirt Boys, DO Visit Date: 09/21/2016 Occupation: @GUAROCC @    Subjective:  No chief complaint on file. Here for follow-up visit on rheumatoid arthritis that is CCP positive, high-risk prescription, gout. She also has abnormal kidney functions in the past but have become normal. We will continue to monitor.  History of Present Illness: [name] [name] is a 82 y.o. female  Last seen in our office on 07/22/2016. She is having multiple joints with synovitis including bilateral wrist joint. She was having a lot of pain. At the last visit we discovered that patient had mistakenly stopped her methotrexate. Taking Orencia subcutaneous [place] 07/24/2016 once a week. On that visit we encouraged the patient to restart her methotrexate at 4 pills per week. On today's visit I will recheck that she has done that and how she is responding to that.  Because she was flaring last week I started her on prednisone 5 mg 4 pills for 5 days, 3 for 5 days, 47for 5 days, 1 for 5 days, then stop. I will check to see if she is responded well to that as well.    Overall the patient responded very well to restarting her methotrexate as well as a prednisone taper. Today she states that she is not having any pain to her wrist or her hands. Unfortunately, her labs from September showed mild elevation of her creatinine and decrease in her GFR and as a result we decreased her methotrexate to 3 pills per week and set at the 4 pills per week. Pedal labs in October showed no change in her abnormal kidney function. Therefore, I plan to repeat her labs today and then see her back in about 2-3 months.  I've asked the patient to discuss with her PCP if any of the other medications that she is taking can contribute to abnormal kidney function. She will discuss with her PCP sometime  soon.    Activities of Daily Living:  Patient reports morning stiffness for 15 minutes.   Patient Denies nocturnal pain.  Difficulty dressing/grooming: Reports Difficulty climbing stairs: Denies Difficulty getting out of chair: Reports Difficulty using hands for taps, buttons, cutlery, and/or writing: Reports   Review of Systems  Constitutional: Negative for fatigue.  HENT: Negative for mouth sores and mouth dryness.   Eyes: Negative for dryness.  Respiratory: Negative for shortness of breath.   Gastrointestinal: Negative for constipation and diarrhea.  Musculoskeletal: Negative for myalgias and myalgias.  Skin: Negative for sensitivity to sunlight.  Psychiatric/Behavioral: Negative for decreased concentration and sleep disturbance.    PMFS History:  Patient Active Problem List   Diagnosis Date Noted  . High risk medications (not anticoagulants) long-term use 09/21/2016  . Hammer toe 10/31/2015  . Pain in lower limb 10/31/2015  . Acute on chronic diastolic heart failure (HCC) 09/12/2015  . Chest pain 09/11/2015  . Avulsion of left hamstring muscle 06/20/2015  . Avulsion of hamstring muscle 06/19/2015  . Onychomycosis 04/24/2015  . Type 2 diabetes mellitus with microalbuminuria or microproteinuria 11/27/2014  . Absolute anemia 07/03/2014  . Midline low back pain without sciatica 06/27/2014  . Bilateral edema of lower extremity 06/05/2014  . Chronic steroid use 06/05/2014  .  CKD (chronic kidney disease) stage 3, GFR 30-59 ml/min 06/05/2014  . Type 2 diabetes mellitus with other diabetic kidney complication (HCC) 06/05/2014  . Hypertensive renal disease 06/05/2014  . Acute sinus infection 05/08/2014  . Tachycardia 05/01/2014  . Anemia of chronic disease 05/01/2014  . Fluctuating blood pressure 02/06/2014  . Dizziness 02/06/2014  . Stroke (HCC) 01/17/2014  . Syncope and collapse 01/17/2014  . Rheumatoid arthritis (HCC) 12/19/2013  . CAD (coronary artery disease)  10/18/2013  . Unspecified arthropathy, lower leg 09/13/2013  . Other specified disease of white blood cells 09/13/2013  . Cold intolerance 09/12/2013  . Anemia 09/12/2013  . Need for prophylactic vaccination and inoculation against influenza 09/12/2013  . Acute blood loss anemia 08/15/2013  . Acute post-hemorrhagic anemia 08/09/2013  . HTN (hypertension) 08/08/2013  . Hyponatremia 08/08/2013  . Preoperative clearance 07/11/2013  . Myalgia and myositis 05/30/2013  . Fibromyalgia 05/10/2013  . Acute gout 05/03/2013  . Acute bronchitis 05/03/2013  . Edema 05/03/2013  . Reactive airway disease 04/19/2013  . Gout 04/19/2013  . HYPERLIPIDEMIA 04/27/2007  . Essential hypertension 04/27/2007  . Allergic rhinitis 04/27/2007  . DIVERTICULOSIS, COLON 04/27/2007  . Osteoarthritis 04/27/2007    Past Medical History:  Diagnosis Date  . Allergic rhinitis due to pollen 04/27/2007  . Arthritis    "in q joint" (08/07/2013)  . Coronary atherosclerosis of native coronary artery   . Cough   . Disorder of bone and cartilage, unspecified   . Edema 05/03/2013  . Exertional shortness of breath    "sometimes" (08/07/2013)  . First degree atrioventricular block   . Gout, unspecified 04/19/2013  . Hyperlipidemia 04/27/2007  . Hypertension   . Muscle spasm   . Muscle weakness (generalized)   . Myocardial infarction ~ 1970  . Onychia and paronychia of toe   . Osteoarthrosis, unspecified whether generalized or localized, unspecified site 04/27/2007  . Other fall   . Other malaise and fatigue   . Prepatellar bursitis   . Seizures (HCC)   . Stroke (HCC) 01/17/2014  . TIA (transient ischemic attack)    "a few one summer" (08/07/2013)  . Type II diabetes mellitus (HCC)    "fasting 90-110s" (08/07/2013)  . Unspecified essential hypertension 08/08/2013  . Unspecified vitamin D deficiency     Family History  Problem Relation Age of Onset  . Heart attack Father    Past Surgical History:  Procedure  Laterality Date  . ABDOMINAL HYSTERECTOMY  04/1980  . APPENDECTOMY  1960  . CARDIAC CATHETERIZATION  2003  . CATARACT EXTRACTION W/ INTRAOCULAR LENS  IMPLANT, BILATERAL Bilateral ~ 2012  . JOINT REPLACEMENT    . REPLACEMENT TOTAL KNEE Right 09/2005  . SHOULDER OPEN ROTATOR CUFF REPAIR Right 07/1999  . TONSILLECTOMY  08/1974  . TOTAL KNEE ARTHROPLASTY Left 08/07/2013  . TOTAL KNEE ARTHROPLASTY Left 08/07/2013   Procedure: TOTAL KNEE ARTHROPLASTY- LEFT;  Surgeon: Dannielle Huh, MD;  Location: [facility] OR;  Service: Orthopedics;  Laterality: Left;  . TRANSTHORACIC ECHOCARDIOGRAM  2003   EF 55-65%; mild concentric LVH   Social History   Social History Narrative   Widowed   Research officer, political party with cane     Objective: Vital Signs: BP (!) 185/105 (BP Location: Left Arm, Patient Position: Sitting, Cuff Size: Large) Comment: patient states she is in pain  Pulse 91   Resp 13   Ht 5\' 4"  (1.626 m)   Wt 178 lb (80.7 kg)   BMI 30.55 kg/m    Physical Exam  Constitutional: She  is oriented to person, place, and time. She appears well-developed and well-nourished.  HENT:  Head: Normocephalic and atraumatic.  Eyes: EOM are normal. Pupils are equal, round, and reactive to light.  Cardiovascular: Normal rate, regular rhythm and normal heart sounds.  Exam reveals no gallop and no friction rub.   No murmur heard. Pulmonary/Chest: Effort normal and breath sounds normal. She has no wheezes. She has no rales.  Abdominal: Soft. Bowel sounds are normal. She exhibits no distension. There is no tenderness. There is no guarding. No hernia.  Musculoskeletal: Normal range of motion. She exhibits no edema, tenderness or deformity.  Lymphadenopathy:    She has no cervical adenopathy.  Neurological: She is alert and oriented to person, place, and time. Coordination normal.  Skin: Skin is warm and dry. Capillary refill takes less than 2 seconds. No rash noted.  Psychiatric: She has a normal mood and affect. Her behavior is normal.   Nursing note and vitals reviewed.    Musculoskeletal Exam:  Decreased range of motion of bilateral shoulder joint. Has 60 of abduction. Grip strength is slightly decreased bilaterally Fibromyalgia tender points are all absent   CDAI Exam: CDAI Homunculus Exam:   Tenderness:  RUE: carpometacarpal  Joint Counts:  CDAI Tender Joint count: 0 CDAI Swollen Joint count: 0  Global Assessments:  Patient Global Assessment: 0 Provider Global Assessment: 0    Investigation: No additional findings.   Imaging: No results found.  Speciality Comments: No specialty comments available.    Procedures:  No procedures performed Allergies: Clonidine derivatives; Fish allergy; Shellfish allergy; Indomethacin; Lyrica [pregabalin]; Methyldopa; Cetirizine hcl; Codeine; and Tomato   Assessment / Plan: Visit Diagnoses: Rheumatoid arthritis involving both hands with negative rheumatoid factor (HCC)  High risk medications (not anticoagulants) long-term use - 1) Orencia Click Jet Q week; 2) MTX 4/week; 3) Folic 1/d;  - Plan: CBC with Differential/Platelet, COMPLETE METABOLIC PANEL WITH GFR  Chronic gout without tophus, unspecified cause, unspecified site  Hypertension, unspecified type   [name] hypertension needs to be addressed by PCP. Patient has had high blood pressure on the last visit when it was 187/94. At that time I attributed this to her pain in her wrists. Today her blood pressure is 185/105. She is not in any pain anymore and I'm concerned that the blood pressures this high. I worry that the blood pressure medicines that she's taking may not be adequately keeping her blood pressure in the proper range. I've advised the patient to discuss this with the PCP.  Patient reports that at home as well as her recent visit to her doctor's office, Dr. Elmon Kirschner office, her blood pressures have been in the normal range. They use an arm blood pressure cuff and patient has a wrist blood  pressure cuff at home. Patient and her daughter Elease Hashimoto state that they were doing a lot of activities today including grocery shopping etc. Patient has been on her feet a lot and they contribute to her elevated blood pressure to these activities. Nonetheless I continue to encourage the patient to monitor her blood pressures at home to make sure that the numbers do not remain this high. If if today's blood pressure is a 1 off thing, it is okay. However if it continues to be elevated most, parts of the day, I am concerned about the risk of stroke and heart attack   I've advised the patient to see if there is any medications that she may be on that may be contributing to  her abnormal kidney function. Currently she is on 3 pills of methotrexate weekly and I cannot go any lower on that. The 3 pills of methotrexate have done a very good job in keeping her wrist joints and MCP joints without synovitis. That plus the Orencia every week is properly addressing her rheumatoid arthritis.  Continue Orencia subcutaneous click jet every week Continue methotrexate 3 per week. [name] kidney function is slightly elevated. In the past her kidney functions have been high but on one occasion in August and was normal. We discussed whether or not methotrexate might be the cause of this or could it be some of her other medications. She will discuss with Dr. Monica Carter if i56.2 coulRamonMarland Kitc5RSuncoast Behav ral Health Centergy63 3098Glbesc 56.2LC DbRamonMarJudie PetitlaBangor Eye Surgery AG>> in about 3 months (around 12/22/2016) for RA, HRRX, abnl kidney fnx, elevated bp in office,.   Rashanda Magloire, PA-C

## 2016-09-21 ENCOUNTER — Ambulatory Visit (INDEPENDENT_AMBULATORY_CARE_PROVIDER_SITE_OTHER): Payer: Medicare Other | Admitting: Rheumatology

## 2016-09-21 ENCOUNTER — Encounter: Payer: Self-pay | Admitting: Rheumatology

## 2016-09-21 VITALS — BP 185/105 | HR 91 | Resp 13 | Ht 64.0 in | Wt 178.0 lb

## 2016-09-21 DIAGNOSIS — M06041 Rheumatoid arthritis without rheumatoid factor, right hand: Secondary | ICD-10-CM | POA: Diagnosis not present

## 2016-09-21 DIAGNOSIS — I10 Essential (primary) hypertension: Secondary | ICD-10-CM

## 2016-09-21 DIAGNOSIS — Z79899 Other long term (current) drug therapy: Secondary | ICD-10-CM

## 2016-09-21 DIAGNOSIS — M1A9XX0 Chronic gout, unspecified, without tophus (tophi): Secondary | ICD-10-CM | POA: Diagnosis not present

## 2016-09-21 DIAGNOSIS — M06042 Rheumatoid arthritis without rheumatoid factor, left hand: Secondary | ICD-10-CM

## 2016-09-21 LAB — CBC WITH DIFFERENTIAL/PLATELET
Basophils Absolute: 0 cells/uL (ref 0–200)
Basophils Relative: 0 %
Eosinophils Absolute: 384 cells/uL (ref 15–500)
Eosinophils Relative: 6 %
HCT: 33.2 % — ABNORMAL LOW (ref 35.0–45.0)
Hemoglobin: 10.9 g/dL — ABNORMAL LOW (ref 11.7–15.5)
Lymphocytes Relative: 37 %
Lymphs Abs: 2368 cells/uL (ref 850–3900)
MCH: 30.3 pg (ref 27.0–33.0)
MCHC: 32.8 g/dL (ref 32.0–36.0)
MCV: 92.2 fL (ref 80.0–100.0)
MPV: 8.5 fL (ref 7.5–12.5)
Monocytes Absolute: 384 cells/uL (ref 200–950)
Monocytes Relative: 6 %
Neutro Abs: 3264 cells/uL (ref 1500–7800)
Neutrophils Relative %: 51 %
Platelets: 334 10*3/uL (ref 140–400)
RBC: 3.6 MIL/uL — ABNORMAL LOW (ref 3.80–5.10)
RDW: 17.8 % — ABNORMAL HIGH (ref 11.0–15.0)
WBC: 6.4 10*3/uL (ref 3.8–10.8)

## 2016-09-21 MED ORDER — ALLOPURINOL 100 MG PO TABS: ORAL_TABLET | ORAL | 1 refills | Status: DC

## 2016-09-21 MED ORDER — METHOTREXATE 2.5 MG PO TABS: 7.5000 mg | ORAL_TABLET | ORAL | 0 refills | Status: DC

## 2016-09-21 MED ORDER — FOLIC ACID 1 MG PO TABS: 1.0000 mg | ORAL_TABLET | Freq: Every day | ORAL | 4 refills | Status: AC

## 2016-09-21 MED ORDER — ORENCIA CLICKJECT 125 MG/ML ~~LOC~~ SOAJ: 125.0000 mg | SUBCUTANEOUS | 0 refills | Status: DC

## 2016-09-22 ENCOUNTER — Telehealth: Payer: Self-pay | Admitting: Radiology

## 2016-09-22 ENCOUNTER — Encounter: Payer: Self-pay | Admitting: Rheumatology

## 2016-09-22 LAB — COMPLETE METABOLIC PANEL WITH GFR
ALT: 9 U/L (ref 6–29)
AST: 19 U/L (ref 10–35)
Albumin: 3.8 g/dL (ref 3.6–5.1)
Alkaline Phosphatase: 70 U/L (ref 33–130)
BUN: 21 mg/dL (ref 7–25)
CO2: 24 mmol/L (ref 20–31)
Calcium: 9.1 mg/dL (ref 8.6–10.4)
Chloride: 102 mmol/L (ref 98–110)
Creat: 1.01 mg/dL — ABNORMAL HIGH (ref 0.60–0.88)
GFR, Est African American: 59 mL/min — ABNORMAL LOW (ref >=60)
GFR, Est Non African American: 51 mL/min — ABNORMAL LOW (ref >=60)
Glucose, Bld: 75 mg/dL (ref 65–99)
Potassium: 4 mmol/L (ref 3.5–5.3)
Sodium: 137 mmol/L (ref 135–146)
Total Bilirubin: 0.3 mg/dL (ref 0.2–1.2)
Total Protein: 6.9 g/dL (ref 6.1–8.1)

## 2016-09-22 NOTE — Telephone Encounter (Signed)
I have called patient to advise labs are stable continue current t'ment

## 2016-09-22 NOTE — Telephone Encounter (Signed)
-----   Message from [place], [place] sent at 09/22/2016 12:46 PM EST ----- Labs from 09/21/2016 shows CMP with GFR normal with creatinine slightly elevated at 1.01 but better than the previous one.GFR is also improved to 59. This is close to normal range. We can monitor. Note change in treatment  CBC with differential is normal except slightly low hemoglobin and hematocrit and RBC is slightly low. They're all close to normal range and we can monitor. No change in treatment for this either

## 2016-09-22 NOTE — Progress Notes (Signed)
Labs from 09/21/2016 shows CMP with GFR normal with creatinine slightly elevated at 1.01 but better than the previous one.GFR is also improved to 59. This is close to normal range. We can monitor. Note change in treatment  CBC with differential is normal except slightly low hemoglobin and hematocrit and RBC is slightly low. They're all close to normal range and we can monitor. No change in treatment for this either

## 2016-09-22 NOTE — Progress Notes (Signed)
[name] recent visit with her dermatologist shows that lesion that he biopsied is consistent with a rheumatoid nodule.  Patient is already) yet and methotrexate for the treatment of rheumatoid arthritis. No further treatment is needed. No change in treatment plan.  Please inform the patient that her lesion is a rheumatoid nodule and we will monitor this over time.

## 2016-09-22 NOTE — Progress Notes (Signed)
Please forward this th Mr. [name] [name]. He saw this pt. Yesterday.

## 2016-09-27 ENCOUNTER — Other Ambulatory Visit: Payer: Self-pay | Admitting: Internal Medicine

## 2016-10-05 ENCOUNTER — Other Ambulatory Visit: Payer: Self-pay | Admitting: Internal Medicine

## 2016-10-07 ENCOUNTER — Telehealth: Payer: Self-pay | Admitting: *Deleted

## 2016-10-07 NOTE — Telephone Encounter (Signed)
Patient daughter, Para March called and stated that [facility] will be faxing an order for Dr. Montez Morita to sign for patient to receive smaller pull ups. It is ok, they requested it.

## 2016-10-12 ENCOUNTER — Telehealth: Payer: Self-pay | Admitting: *Deleted

## 2016-10-12 MED ORDER — PREDNISONE 5 MG PO TABS: ORAL_TABLET | ORAL | 0 refills | Status: DC

## 2016-10-12 NOTE — Telephone Encounter (Signed)
[name] notified we will send prescription to pharmacy. She is agreeable to contact PCP for adjusting her medication for diabetes. Prescription sent to pharmacy.

## 2016-10-12 NOTE — Telephone Encounter (Signed)
Okay to give her prednisone 5 mg4 pills for 3 days3 pills for 3 days2 pills for 3 daysOne pill for 3 daysStopA she needs to speak with her PCP to adjust her medication for diabetes since her sugars will go up. If she is agreeable with this we will prescribe her the medication

## 2016-10-12 NOTE — Telephone Encounter (Signed)
[name] contacted the office stating her mother is having swelling in her hands, arms and shoulders. Patient is stating the pain in her shoulders and arms is worse than normal. [name] states that this has just started today. Patient is currently on MTX 3 tabs per week, Orencia 125 mg weekly and Allopurinol 100 mg daily. [name] was requesting a prescription for prednisone for her mother. She was last seen 09/21/16.

## 2016-10-13 ENCOUNTER — Telehealth: Payer: Self-pay | Admitting: *Deleted

## 2016-10-13 NOTE — Telephone Encounter (Signed)
Para March, daughter notified and agreed.

## 2016-10-13 NOTE — Telephone Encounter (Signed)
Patient daughter, Para March called and stated that the Rheumatologist gave patient a Prednisone Taper due to Pain and swelling and told her to call her PCP to inform us due to it could have and effect on her blood sugar. Over the night she had night sweats. Patient started the taper yesterday. Please Advise.

## 2016-10-13 NOTE — Telephone Encounter (Signed)
Prednisone will increase blood sugar levels. She needs to check blood sugar 3 times daily while on medication and call office if they are > 200. Continue metformin as ordered. Prednisone will also cause sweating and flushing

## 2016-10-14 ENCOUNTER — Other Ambulatory Visit: Payer: Self-pay | Admitting: Internal Medicine

## 2016-10-14 ENCOUNTER — Telehealth: Payer: Self-pay | Admitting: Rheumatology

## 2016-10-14 NOTE — Telephone Encounter (Signed)
[name] is returning Rachel's call; she has questions about her mother's medication.

## 2016-10-14 NOTE — Telephone Encounter (Signed)
Returned phone call to patient's daughter Lattie Corns.  She had questions regarding transferring patient's prescription to a local pharmacy.  Patient has been filling her Orencia through [facility].  I advised her on how to transfer her mother's prescription if she was interested.  She voiced understanding.  Advised her to call me back if she has any other questions or concerns.    Lilla Shook, Pharm.D., BCPS Clinical Pharmacist Pager: [phone] Phone: [phone] 10/14/2016 10:23 AM

## 2016-10-21 ENCOUNTER — Telehealth: Payer: Self-pay | Admitting: Internal Medicine

## 2016-10-21 NOTE — Telephone Encounter (Signed)
left msg asking pt to confirm this AWV w/ nurse and EV w/ Dr. Carter. VDM (DD) °

## 2016-10-27 ENCOUNTER — Telehealth: Payer: Self-pay | Admitting: Rheumatology

## 2016-10-27 NOTE — Telephone Encounter (Signed)
[name] called requesting a refill of prednisone for patient. Patient finished last rx of prednisone and swelling in her hands is getting bad again. Patient uses [facility] on [address].

## 2016-10-28 NOTE — Telephone Encounter (Signed)
[name] spoke to Mr. [name] [name], [place] and a prescription for prednisone was sent to the [facility].  Prednisone 5 mg with directions for 4 tablets for 3 days, 3 tablets for 3 days, 2 tablets for 3 days and 1 tablet for 3 days then stop.

## 2016-10-29 ENCOUNTER — Ambulatory Visit (INDEPENDENT_AMBULATORY_CARE_PROVIDER_SITE_OTHER): Payer: Medicare Other | Admitting: Podiatry

## 2016-10-29 ENCOUNTER — Encounter: Payer: Self-pay | Admitting: Podiatry

## 2016-10-29 DIAGNOSIS — B351 Tinea unguium: Secondary | ICD-10-CM | POA: Diagnosis not present

## 2016-10-29 DIAGNOSIS — M79671 Pain in right foot: Secondary | ICD-10-CM

## 2016-10-29 DIAGNOSIS — M79672 Pain in left foot: Secondary | ICD-10-CM

## 2016-10-29 NOTE — Patient Instructions (Signed)
Seen for hypertrophic nails. All nails debrided. Return in 3 months or as needed.  

## 2016-10-29 NOTE — Progress Notes (Signed)
SUBJECTIVE: 82 y.o. year old female accompanied by her daughter presents requesting toe nails trimmed.  She was not able to use Diabetic shoes due to artificial knees. She found different pair from [employer], which is doing well.  Rheumatoid arthritis has been affecting her for the past 4-5 years and fingers and toes are affected.  OBJECTIVE: DERMATOLOGIC EXAMINATION: Thick dystrophic and hypertrophic nails x 10. Recent history of skin biopsy right lower limb.  VASCULAR EXAMINATION OF LOWER LIMBS: All pedal pulses are palpable with normal pulsation.  No edema or erythema noted.  NEUROLOGIC EXAMINATION OF THE LOWER LIMBS: All epicritic and tactile sensations grossly intact.  MUSCULOSKELETAL EXAMINATION: Severe contracted 3rd digits at PIPJ and DIPJ curling into sulcus area bilateral painful with weight bearing.  Assessment: Onychomycosis x 10.  Hammer toe deformity 3rd bilateral pain with ambulation.  PLAN: All nails debrided. Return in 3 months.

## 2016-11-03 ENCOUNTER — Other Ambulatory Visit: Payer: Self-pay | Admitting: Rheumatology

## 2016-11-04 NOTE — Telephone Encounter (Signed)
09/21/2016 office visit  and labs  07/03/16 neg TB gold  12/31/16 next visit Ok to refill per Dr Corliss Skains

## 2016-11-10 ENCOUNTER — Telehealth: Payer: Self-pay | Admitting: Rheumatology

## 2016-11-10 NOTE — Telephone Encounter (Signed)
Patient having a flare of hand pain swelling/ ok to refill prednisone taper ? She just had a taper on 10/28/16 also  I will have the front desk make her a sooner appt than Feb

## 2016-11-10 NOTE — Telephone Encounter (Signed)
Ok to do another taper but, we will have to review if current treatment is adequate or not at next visit. Last taper was 10/12/2016 and it was tapering every 3 days.Since she is flaring again I will taper every 4 days. See below  Let's do this taper: OK to Prescribe  Prednisone 5mg :  4po qAM x 4 days,  3po qAM x 4 days, 2po qAM x 4 days,  1po qAM x 4 days, 1/2po qAM x 4 days, then stop.; disp 42 pills w/ no refills.  Take until all gone and exactly as Rx'd. If diabetic, watch sugar levels and have pcp make adjustments for better control if needed.

## 2016-11-10 NOTE — Telephone Encounter (Signed)
Patient has scheduled appointment for 11/25/16.

## 2016-11-10 NOTE — Telephone Encounter (Signed)
[name] left a voicemail stating the patient is having a lot of swelling in her hands and joints. Patient is requesting a refill of prednisone. [name] Lattie Corns is requesting a call back as soon as possible.

## 2016-11-11 MED ORDER — PREDNISONE 5 MG PO TABS: ORAL_TABLET | ORAL | 0 refills | Status: DC

## 2016-11-11 NOTE — Telephone Encounter (Signed)
Sent to me by mistake, forwarding to [place].

## 2016-11-11 NOTE — Telephone Encounter (Signed)
Prescription sent to the pharmacy. Patient aware.

## 2016-11-13 ENCOUNTER — Telehealth: Payer: Self-pay | Admitting: Rheumatology

## 2016-11-13 NOTE — Telephone Encounter (Signed)
-----   Message from Caffie Damme, RT sent at 11/10/2016 10:12 AM EST ----- Has had a couple flares recently, please call work in for sooner appt any day, any time

## 2016-11-13 NOTE — Telephone Encounter (Signed)
I called patient. Spoke with her daughter Marylu Lund. Patient is doing better with the prednisone taper that we gave her. I reminded the daughter that patient has a follow-up appointment generally 17 2018. At that visit we want to review why she is requiring prednisone taper as frequently as she's been getting. We want to confirm that her current medication is adequately doing the job. [name] is agreeable and will keep follow-up appointment.

## 2016-11-16 ENCOUNTER — Other Ambulatory Visit: Payer: Self-pay | Admitting: Internal Medicine

## 2016-11-23 NOTE — Progress Notes (Deleted)
Office Visit Note  Patient: [name] [name]             Date of Birth: Nov 26, 1929           MRN: [id number]             PCP: Kirt Boys, DO Referring: Kirt Boys, DO Visit Date: 11/25/2016 Occupation: @GUAROCC @    Subjective:  No chief complaint on file.   History of Present Illness: [name] [name] is a 84 y.o. female ***   Activities of Daily Living:  Patient reports morning stiffness for *** {minute/hour:19697}.   Patient {ACTIONS;DENIES/REPORTS:21021675::"Denies"} nocturnal pain.  Difficulty dressing/grooming: {ACTIONS;DENIES/REPORTS:21021675::"Denies"} Difficulty climbing stairs: {ACTIONS;DENIES/REPORTS:21021675::"Denies"} Difficulty getting out of chair: {ACTIONS;DENIES/REPORTS:21021675::"Denies"} Difficulty using hands for taps, buttons, cutlery, and/or writing: {ACTIONS;DENIES/REPORTS:21021675::"Denies"}   No Rheumatology ROS completed.   PMFS History:  Patient Active Problem List   Diagnosis Date Noted  . High risk medication use 11/23/2016  . High risk medications (not anticoagulants) long-term use 09/21/2016  . Hammer toe 10/31/2015  . Pain in lower limb 10/31/2015  . Acute on chronic diastolic heart failure (HCC) 09/12/2015  . Chest pain 09/11/2015  . Avulsion of left hamstring muscle 06/20/2015  . Avulsion of hamstring muscle 06/19/2015  . Onychomycosis 04/24/2015  . Type 2 diabetes mellitus with microalbuminuria or microproteinuria 11/27/2014  . Absolute anemia 07/03/2014  . Midline low back pain without sciatica 06/27/2014  . Bilateral edema of lower extremity 06/05/2014  . Chronic steroid use 06/05/2014  . CKD (chronic kidney disease) stage 3, GFR 30-59 ml/min 06/05/2014  . Type 2 diabetes mellitus with other diabetic kidney complication (HCC) 06/05/2014  . Hypertensive renal disease 06/05/2014  . Acute sinus infection 05/08/2014  . Tachycardia 05/01/2014  . Anemia of chronic disease 05/01/2014  . Fluctuating blood pressure 02/06/2014  .  Dizziness 02/06/2014  . Stroke (HCC) 01/17/2014  . Syncope and collapse 01/17/2014  . Rheumatoid arthritis (HCC) 12/19/2013  . CAD (coronary artery disease) 10/18/2013  . Unspecified arthropathy, lower leg 09/13/2013  . Other specified disease of white blood cells 09/13/2013  . Cold intolerance 09/12/2013  . Anemia 09/12/2013  . Need for prophylactic vaccination and inoculation against influenza 09/12/2013  . Acute blood loss anemia 08/15/2013  . Acute post-hemorrhagic anemia 08/09/2013  . HTN (hypertension) 08/08/2013  . Hyponatremia 08/08/2013  . Preoperative clearance 07/11/2013  . Myalgia and myositis 05/30/2013  . Fibromyalgia 05/10/2013  . Acute gout 05/03/2013  . Acute bronchitis 05/03/2013  . Edema 05/03/2013  . Reactive airway disease 04/19/2013  . Gout 04/19/2013  . HYPERLIPIDEMIA 04/27/2007  . Essential hypertension 04/27/2007  . Allergic rhinitis 04/27/2007  . DIVERTICULOSIS, COLON 04/27/2007  . Osteoarthritis 04/27/2007    Past Medical History:  Diagnosis Date  . Allergic rhinitis due to pollen 04/27/2007  . Arthritis    "in q joint" (08/07/2013)  . Coronary atherosclerosis of native coronary artery   . Cough   . Disorder of bone and cartilage, unspecified   . Edema 05/03/2013  . Exertional shortness of breath    "sometimes" (08/07/2013)  . First degree atrioventricular block   . Gout, unspecified 04/19/2013  . Hyperlipidemia 04/27/2007  . Hypertension   . Muscle spasm   . Muscle weakness (generalized)   . Myocardial infarction ~ 1970  . Onychia and paronychia of toe   . Osteoarthrosis, unspecified whether generalized or localized, unspecified site 04/27/2007  . Other fall   . Other malaise and fatigue   . Prepatellar bursitis   . Seizures (HCC)   .  Stroke (HCC) 01/17/2014  . TIA (transient ischemic attack)    "a few one summer" (08/07/2013)  . Type II diabetes mellitus (HCC)    "fasting 90-110s" (08/07/2013)  . Unspecified essential hypertension  08/08/2013  . Unspecified vitamin D deficiency     Family History  Problem Relation Age of Onset  . Heart attack Father    Past Surgical History:  Procedure Laterality Date  . ABDOMINAL HYSTERECTOMY  04/1980  . APPENDECTOMY  1960  . CARDIAC CATHETERIZATION  2003  . CATARACT EXTRACTION W/ INTRAOCULAR LENS  IMPLANT, BILATERAL Bilateral ~ 2012  . JOINT REPLACEMENT    . REPLACEMENT TOTAL KNEE Right 09/2005  . SHOULDER OPEN ROTATOR CUFF REPAIR Right 07/1999  . TONSILLECTOMY  08/1974  . TOTAL KNEE ARTHROPLASTY Left 08/07/2013  . TOTAL KNEE ARTHROPLASTY Left 08/07/2013   Procedure: TOTAL KNEE ARTHROPLASTY- LEFT;  Surgeon: Dannielle Huh, MD;  Location: [facility] OR;  Service: Orthopedics;  Laterality: Left;  . TRANSTHORACIC ECHOCARDIOGRAM  2003   EF 55-65%; mild concentric LVH   Social History   Social History Narrative   Widowed   Walks with cane     Objective: Vital Signs: There were no vitals taken for this visit.   Physical Exam   Musculoskeletal Exam: ***  CDAI Exam: No CDAI exam completed.    Investigation: No additional findings.   Imaging: No results found.  Speciality Comments: No specialty comments available.  CBC    Component Value Date/Time   WBC 6.4 09/21/2016 1529   RBC 3.60 (L) 09/21/2016 1529   HGB 10.9 (L) 09/21/2016 1529   HCT 33.2 (L) 09/21/2016 1529   HCT 28.0 (L) 09/09/2015 1210   PLT 334 09/21/2016 1529   PLT 340 09/09/2015 1210   MCV 92.2 09/21/2016 1529   MCV 86 09/09/2015 1210   MCH 30.3 09/21/2016 1529   MCHC 32.8 09/21/2016 1529   RDW 17.8 (H) 09/21/2016 1529   RDW 16.8 (H) 09/09/2015 1210   LYMPHSABS 2,368 09/21/2016 1529   LYMPHSABS 1.9 09/09/2015 1210   MONOABS 384 09/21/2016 1529   EOSABS 384 09/21/2016 1529   EOSABS 0.5 (H) 09/09/2015 1210   BASOSABS 0 09/21/2016 1529   BASOSABS 0.0 09/09/2015 1210   CMP     Component Value Date/Time   NA 137 09/21/2016 1529   NA 134 (A) 02/18/2016   K 4.0 09/21/2016 1529   CL 102 09/21/2016  1529   CO2 24 09/21/2016 1529   GLUCOSE 75 09/21/2016 1529   BUN 21 09/21/2016 1529   BUN 31 (A) 02/18/2016   CREATININE 1.01 (H) 09/21/2016 1529   CALCIUM 9.1 09/21/2016 1529   PROT 6.9 09/21/2016 1529   PROT 7.2 09/09/2015 1210   ALBUMIN 3.8 09/21/2016 1529   ALBUMIN 3.9 09/09/2015 1210   AST 19 09/21/2016 1529   ALT 9 09/21/2016 1529   ALKPHOS 70 09/21/2016 1529   BILITOT 0.3 09/21/2016 1529   BILITOT 0.5 09/09/2015 1210   GFRNONAA 51 (L) 09/21/2016 1529   GFRAA 59 (L) 09/21/2016 1529   Procedures:  No procedures performed Allergies: Clonidine derivatives; Fish allergy; Shellfish allergy; Indomethacin; Lyrica [pregabalin]; Methyldopa; Cetirizine hcl; Codeine; and Tomato   Assessment / Plan:     Visit Diagnoses: Rheumatoid arthritis involving both hands with negative rheumatoid factor (HCC)  High risk medication use - Orencia subcutaneous, methotrexate 4 tablets per week, folic acid 1 mg daily  History of gout  Fibromyalgia   Other medical problems include history of essential hypertension, diverticulosis, asthma,, anemia  Orders: No orders of the defined types were placed in this encounter.  No orders of the defined types were placed in this encounter.   Face-to-face time spent with patient was *** minutes. 50 of time was spent in counseling and coordination of care.  Follow-Up Instructions: No Follow-up on file.   Pollyann Savoy, MD  Note - This record has been created using Animal nutritionist.  Chart creation errors have been sought, but may not always  have been located. Such creation errors do not reflect on  the standard of medical care.

## 2016-11-24 ENCOUNTER — Telehealth: Payer: Self-pay | Admitting: Internal Medicine

## 2016-11-24 NOTE — Telephone Encounter (Signed)
left another msg asking pt to confirm this AWV/EV. VDM (DD)

## 2016-11-25 ENCOUNTER — Ambulatory Visit: Payer: Medicare Other | Admitting: Rheumatology

## 2016-11-28 ENCOUNTER — Encounter: Payer: Self-pay | Admitting: Rheumatology

## 2016-11-28 ENCOUNTER — Ambulatory Visit (INDEPENDENT_AMBULATORY_CARE_PROVIDER_SITE_OTHER): Payer: Medicare Other | Admitting: Rheumatology

## 2016-11-28 VITALS — BP 144/80 | HR 78 | Resp 18 | Ht 64.0 in | Wt 184.0 lb

## 2016-11-28 DIAGNOSIS — M1A9XX0 Chronic gout, unspecified, without tophus (tophi): Secondary | ICD-10-CM

## 2016-11-28 DIAGNOSIS — M25512 Pain in left shoulder: Secondary | ICD-10-CM | POA: Diagnosis not present

## 2016-11-28 DIAGNOSIS — M25532 Pain in left wrist: Secondary | ICD-10-CM

## 2016-11-28 DIAGNOSIS — M25531 Pain in right wrist: Secondary | ICD-10-CM

## 2016-11-28 DIAGNOSIS — E7800 Pure hypercholesterolemia, unspecified: Secondary | ICD-10-CM

## 2016-11-28 DIAGNOSIS — M0579 Rheumatoid arthritis with rheumatoid factor of multiple sites without organ or systems involvement: Secondary | ICD-10-CM | POA: Diagnosis not present

## 2016-11-28 DIAGNOSIS — Z79899 Other long term (current) drug therapy: Secondary | ICD-10-CM | POA: Diagnosis not present

## 2016-11-28 DIAGNOSIS — M25511 Pain in right shoulder: Secondary | ICD-10-CM

## 2016-11-28 DIAGNOSIS — G8929 Other chronic pain: Secondary | ICD-10-CM | POA: Diagnosis not present

## 2016-11-28 NOTE — Patient Instructions (Signed)
Tofacitinib tablets What is this medicine? TOFACITINIB (TOE fa SYE ti nib) is a medicine that works on the immune system. This medicine is used to treat rheumatoid arthritis. COMMON BRAND NAME(S): [name] What should I tell my health care provider before I take this medicine? They need to know if you have any of these conditions: -cancer -diabetes -high cholesterol -immune system problems -infection (especially a virus infection such as chickenpox, cold sores, or herpes) -kidney disease -liver disease -low blood counts, like low white cell, platelet, or red cell counts -stomach or intestine problems -an unusual or allergic reaction to tofacitinib, other medicines, foods, dyes, or preservatives -pregnant or trying to get pregnant -breast-feeding How should I use this medicine? Take this medicine by mouth with a glass of water. Follow the directions on the prescription label. You can take it with or without food. If it upsets your stomach, take it with food. A special MedGuide will be given to you by the pharmacist with each prescription and refill. Be sure to read this information carefully each time. Talk to your pediatrician regarding the use of this medicine in children. Special care may be needed. What if I miss a dose? If you miss a dose, take it as soon as you can. If it is almost time for your next dose, take only that dose. Do not take double or extra doses. What may interact with this medicine? Do not take this medicine with any of the following medications: -abatacept -adalimumab -anakinra -azathioprine -certolizumab -cyclosporine -etanercept -golimumab -infliximab -live vaccines -rituximab -tacrolimus -tocilizumab This medicine may also interact with the following medications: -fluconazole -ketoconazole -rifampin -vaccines What should I watch for while using this medicine? Tell your doctor or healthcare professional if your symptoms do not start to get better or  if they get worse. Avoid taking products that contain aspirin, acetaminophen, ibuprofen, naproxen, or ketoprofen unless instructed by your doctor. These medicines may hide a fever. Call your doctor or health care professional for advice if you get a fever, chills or sore throat, or other symptoms of a cold or flu. Do not treat yourself. This drug decreases your body's ability to fight infections. Try to avoid being around people who are sick. What side effects may I notice from receiving this medicine? Side effects that you should report to your doctor or health care professional as soon as possible: -allergic reactions like skin rash, itching or hives, swelling of the face, lips, or tongue -breathing problems -dizziness -signs of infection - fever or chills, cough, sore throat, pain or trouble passing urine -signs and symptoms of liver injury like dark yellow or brown urine; general ill feeling or flu-like symptoms; light-colored stools; loss of appetite; nausea; right upper belly pain; unusually weak or tired; yellowing of the eyes or skin -stomach pain or a sudden change in bowel habits -unusually weak or tired Side effects that usually do not require medical attention (report to your doctor or health care professional if they continue or are bothersome): -diarrhea -headache -muscle aches -runny nose -sinus trouble Where should I keep my medicine? Keep out of the reach of children. Store between 20 and 25 degrees C (68 and 77 degrees F). Throw away any unused medicine after the expiration date.  2017 [employer] Standard (2015-12-16 15:19:57)

## 2016-11-28 NOTE — Progress Notes (Signed)
Office Visit Note  Patient: [name] [name]             Date of Birth: 25-Aug-1930           MRN: [id number]             PCP: Gildardo Cranker, DO Referring: Gildardo Cranker, DO Visit Date: 11/28/2016 Occupation: _0 @    Subjective:  Follow-up Follow-up on seropositive rheumatoid arthritis with positive CCP  History of Present Illness: [name] [name] is a 83 y.o. female  Last seen in our office 07/22/2016 Currently taking Orencia subcutaneous every week Taking methotrexate every Thursday 3 pills per week Folic acid 1 mg every day   She did relatively well until recently when she started having frequent flares. Patient has had frequent prednisone taper needs: On 07/22/2016 we give her prednisone: 4 pills for 5 days, 3 pills for 5, 2 pills for 5, 1 pills for 5, stop On 10/27/2016 we did prednisone 5 mg: 4 pills for 3 days, 3 for 3 days, 2 for 3 days, 1 for 3 days, stop On January can 2018, we did prednisone 5 mg: 444 days, 3 for 4 days, 2 for 4 days, 1 for 4 days, half for 4 days then stop. She finished a taper yesterday. She is doing great today. As a result of the frequent prednisone use, I brought her in today so we can evaluate and discuss possible switching of her biologic.  Patient is accompanied by her [name].  Note: Went to stop Humira and switch her to [place] because she had congestive heart failure when she was on Humira. Otherwise it had worked well for her  Activities of Daily Living:  Patient reports morning stiffness for 3-4 hours.   Patient Denies nocturnal pain.  Difficulty dressing/grooming: Reports Difficulty climbing stairs: Denies Difficulty getting out of chair: Reports Difficulty using hands for taps, buttons, cutlery, and/or writing: Reports  When patient is doing well and she is not flaring, the ADLs are all relatively acceptable; when she is flaring she has stiffness and pain all day long and all of the above ADLs are problematic for  the patient   Review of Systems  Constitutional: Negative for fatigue.  HENT: Negative for mouth sores and mouth dryness.   Eyes: Negative for dryness.  Respiratory: Negative for shortness of breath.   Gastrointestinal: Negative for constipation and diarrhea.  Musculoskeletal: Negative for myalgias and myalgias.  Skin: Negative for sensitivity to sunlight.  Psychiatric/Behavioral: Negative for decreased concentration and sleep disturbance.    PMFS History:  Patient Active Problem List   Diagnosis Date Noted  . High risk medication use 11/23/2016  . High risk medications (not anticoagulants) long-term use 09/21/2016  . Hammer toe 10/31/2015  . Pain in lower limb 10/31/2015  . Acute on chronic diastolic heart failure ([place]) 09/12/2015  . Chest pain 09/11/2015  . Avulsion of left hamstring muscle 06/20/2015  . Avulsion of hamstring muscle 06/19/2015  . Onychomycosis 04/24/2015  . Type 2 diabetes mellitus with microalbuminuria or microproteinuria 11/27/2014  . Absolute anemia 07/03/2014  . Midline low back pain without sciatica 06/27/2014  . Bilateral edema of lower extremity 06/05/2014  . Chronic steroid use 06/05/2014  . CKD (chronic kidney disease) stage 3, GFR 30-59 ml/min 06/05/2014  . Type 2 diabetes mellitus with other diabetic kidney complication ([place]) 28/78/6767  . Hypertensive renal disease 06/05/2014  . Acute sinus infection 05/08/2014  . Tachycardia 05/01/2014  . Anemia of chronic disease 05/01/2014  .  Fluctuating blood pressure 02/06/2014  . Dizziness 02/06/2014  . Stroke ([place]) 01/17/2014  . Syncope and collapse 01/17/2014  . Rheumatoid arthritis ([place]) 12/19/2013  . CAD (coronary artery disease) 10/18/2013  . Unspecified arthropathy, lower leg 09/13/2013  . Other specified disease of white blood cells 09/13/2013  . Cold intolerance 09/12/2013  . Anemia 09/12/2013  . Need for prophylactic vaccination and inoculation against influenza 09/12/2013  . Acute blood  loss anemia 08/15/2013  . Acute post-hemorrhagic anemia 08/09/2013  . HTN (hypertension) 08/08/2013  . Hyponatremia 08/08/2013  . Preoperative clearance 07/11/2013  . Myalgia and myositis 05/30/2013  . Fibromyalgia 05/10/2013  . Acute gout 05/03/2013  . Acute bronchitis 05/03/2013  . Edema 05/03/2013  . Reactive airway disease 04/19/2013  . Gout 04/19/2013  . HYPERLIPIDEMIA 04/27/2007  . Essential hypertension 04/27/2007  . Allergic rhinitis 04/27/2007  . DIVERTICULOSIS, COLON 04/27/2007  . Osteoarthritis 04/27/2007    Past Medical History:  Diagnosis Date  . Allergic rhinitis due to pollen 04/27/2007  . Arthritis    "in q joint" (08/07/2013)  . Coronary atherosclerosis of native coronary artery   . Cough   . Disorder of bone and cartilage, unspecified   . Edema 05/03/2013  . Exertional shortness of breath    "sometimes" (08/07/2013)  . First degree atrioventricular block   . Gout, unspecified 04/19/2013  . Hyperlipidemia 04/27/2007  . Hypertension   . Muscle spasm   . Muscle weakness (generalized)   . Myocardial infarction ~ 1970  . Onychia and paronychia of toe   . Osteoarthrosis, unspecified whether generalized or localized, unspecified site 04/27/2007  . Other fall   . Other malaise and fatigue   . Prepatellar bursitis   . Seizures ([place])   . Stroke ([place]) 01/17/2014  . TIA (transient ischemic attack)    "a few one summer" (08/07/2013)  . Type II diabetes mellitus ([place])    "fasting 90-110s" (08/07/2013)  . Unspecified essential hypertension 08/08/2013  . Unspecified vitamin D deficiency     Family History  Problem Relation Age of Onset  . Heart attack Father    Past Surgical History:  Procedure Laterality Date  . ABDOMINAL HYSTERECTOMY  04/1980  . APPENDECTOMY  1960  . CARDIAC CATHETERIZATION  2003  . CATARACT EXTRACTION W/ INTRAOCULAR LENS  IMPLANT, BILATERAL Bilateral ~ 2012  . JOINT REPLACEMENT    . REPLACEMENT TOTAL KNEE Right 09/2005  . SHOULDER OPEN  ROTATOR CUFF REPAIR Right 07/1999  . TONSILLECTOMY  08/1974  . TOTAL KNEE ARTHROPLASTY Left 08/07/2013  . TOTAL KNEE ARTHROPLASTY Left 08/07/2013   Procedure: TOTAL KNEE ARTHROPLASTY- LEFT;  Surgeon: Vickey Huger, MD;  Location: [facility];  Service: Orthopedics;  Laterality: Left;  . TRANSTHORACIC ECHOCARDIOGRAM  2003   EF 55-65%; mild concentric LVH   Social History   Social History Narrative   Widowed   Psychologist, counselling with cane     Objective: Vital Signs: BP (!) 144/80   Pulse 78   Resp 18   Ht 5' 4" (1.626 m)   Wt 184 lb (83.5 kg)   BMI 31.58 kg/m    Physical Exam  Constitutional: She is oriented to person, place, and time. She appears well-developed and well-nourished.  HENT:  Head: Normocephalic and atraumatic.  Eyes: EOM are normal. Pupils are equal, round, and reactive to light.  Cardiovascular: Normal rate, regular rhythm and normal heart sounds.  Exam reveals no gallop and no friction rub.   No murmur heard. Pulmonary/Chest: Effort normal and breath sounds normal. She  has no wheezes. She has no rales.  Abdominal: Soft. Bowel sounds are normal. She exhibits no distension. There is no tenderness. There is no guarding. No hernia.  Musculoskeletal: Normal range of motion. She exhibits no edema, tenderness or deformity.  Lymphadenopathy:    She has no cervical adenopathy.  Neurological: She is alert and oriented to person, place, and time. Coordination normal.  Skin: Skin is warm and dry. Capillary refill takes less than 2 seconds. No rash noted.  Psychiatric: She has a normal mood and affect. Her behavior is normal.  Nursing note and vitals reviewed.    Musculoskeletal Exam:  Full range of motion of all joints however decreased range of motion of bilateral shoulder joint. She only has 90 of abduction of her shoulder joints but that is only possible in the middle of the day and later. In the morning they're very stiff and she has very little range of motion. Grip strength is equal  and strong bilaterally Fiber myalgia tender points are all absent  CDAI Exam: CDAI Homunculus Exam:   Joint Counts:  CDAI Tender Joint count: 0 CDAI Swollen Joint count: 0  She only has synovial thickening of the right wrist and radial carpal bone: Synovial thickening of the right second and fourth MCP but no synovitis on any of the other joints of the right hand or the left hand  Patient just finished a 20 day taper of prednisone yesterday.  She is on Orencia subcutaneous every week On methotrexate 3 pills per week (Used to be on 4 pills of methotrexate per week but we had to switch her to 3 pills due to labs).   Investigation: No additional findings.  Office Visit on 09/21/2016  Component Date Value Ref Range Status  . WBC 09/21/2016 6.4  3.8 - 10.8 K/uL Final  . RBC 09/21/2016 3.60* 3.80 - 5.10 MIL/uL Final  . Hemoglobin 09/21/2016 10.9* 11.7 - 15.5 g/dL Final  . HCT 09/21/2016 33.2* 35.0 - 45.0 % Final  . MCV 09/21/2016 92.2  80.0 - 100.0 fL Final  . MCH 09/21/2016 30.3  27.0 - 33.0 pg Final  . MCHC 09/21/2016 32.8  32.0 - 36.0 g/dL Final  . RDW 09/21/2016 17.8* 11.0 - 15.0 % Final  . Platelets 09/21/2016 334  140 - 400 K/uL Final  . MPV 09/21/2016 8.5  7.5 - 12.5 fL Final  . Neutro Abs 09/21/2016 3264  1,500 - 7,800 cells/uL Final  . Lymphs Abs 09/21/2016 2368  850 - 3,900 cells/uL Final  . Monocytes Absolute 09/21/2016 384  200 - 950 cells/uL Final  . Eosinophils Absolute 09/21/2016 384  15 - 500 cells/uL Final  . Basophils Absolute 09/21/2016 0  0 - 200 cells/uL Final  . Neutrophils Relative % 09/21/2016 51  % Final  . Lymphocytes Relative 09/21/2016 37  % Final  . Monocytes Relative 09/21/2016 6  % Final  . Eosinophils Relative 09/21/2016 6  % Final  . Basophils Relative 09/21/2016 0  % Final  . Smear Review 09/21/2016 Criteria for review not met   Final  . Sodium 09/21/2016 137  135 - 146 mmol/L Final  . Potassium 09/21/2016 4.0  3.5 - 5.3 mmol/L Final  .  Chloride 09/21/2016 102  98 - 110 mmol/L Final  . CO2 09/21/2016 24  20 - 31 mmol/L Final  . Glucose, Bld 09/21/2016 75  65 - 99 mg/dL Final  . BUN 09/21/2016 21  7 - 25 mg/dL Final  . Creat 09/21/2016 1.01* 0.60 -  0.88 mg/dL Final   Comment:   For patients > or = 81 years of age: The upper reference limit for Creatinine is approximately 13% higher for people identified as African-American.     . Total Bilirubin 09/21/2016 0.3  0.2 - 1.2 mg/dL Final  . Alkaline Phosphatase 09/21/2016 70  33 - 130 U/L Final  . AST 09/21/2016 19  10 - 35 U/L Final  . ALT 09/21/2016 9  6 - 29 U/L Final  . Total Protein 09/21/2016 6.9  6.1 - 8.1 g/dL Final  . Albumin 09/21/2016 3.8  3.6 - 5.1 g/dL Final  . Calcium 09/21/2016 9.1  8.6 - 10.4 mg/dL Final  . GFR, Est African American 09/21/2016 59* >=60 mL/min Final  . GFR, Est Non African American 09/21/2016 51* >=60 mL/min Final  Lab on 09/01/2016  Component Date Value Ref Range Status  . Sodium 09/01/2016 137  135 - 146 mmol/L Final  . Potassium 09/01/2016 4.3  3.5 - 5.3 mmol/L Final  . Chloride 09/01/2016 103  98 - 110 mmol/L Final  . CO2 09/01/2016 21  20 - 31 mmol/L Final  . Glucose, Bld 09/01/2016 104* 65 - 99 mg/dL Final  . BUN 09/01/2016 18  7 - 25 mg/dL Final  . Creat 09/01/2016 1.06* 0.60 - 0.88 mg/dL Final   Comment:   For patients > or = 81 years of age: The upper reference limit for Creatinine is approximately 13% higher for people identified as African-American.     . Total Bilirubin 09/01/2016 0.4  0.2 - 1.2 mg/dL Final  . Alkaline Phosphatase 09/01/2016 64  33 - 130 U/L Final  . AST 09/01/2016 17  10 - 35 U/L Final  . ALT 09/01/2016 8  6 - 29 U/L Final  . Total Protein 09/01/2016 6.9  6.1 - 8.1 g/dL Final  . Albumin 09/01/2016 3.7  3.6 - 5.1 g/dL Final  . Calcium 09/01/2016 9.3  8.6 - 10.4 mg/dL Final  . GFR, Est African American 09/01/2016 56* >=60 mL/min Final  . GFR, Est Non African American 09/01/2016 48* >=60 mL/min Final    Appointment on 07/30/2016  Component Date Value Ref Range Status  . Hgb A1c MFr Bld 07/30/2016 5.6  <5.7 % Final   Comment:   For the purpose of screening for the presence of diabetes:   <5.7%       Consistent with the absence of diabetes 5.7-6.4 %   Consistent with increased risk for diabetes (prediabetes) >=6.5 %     Consistent with diabetes   This assay result is consistent with a decreased risk of diabetes.   Currently, no consensus exists regarding use of hemoglobin A1c for diagnosis of diabetes in children.   According to American Diabetes Association (ADA) guidelines, hemoglobin A1c <7.0% represents optimal control in non-pregnant diabetic patients. Different metrics may apply to specific patient populations. Standards of Medical Care in Diabetes (ADA).     . Mean Plasma Glucose 07/30/2016 114  mg/dL Final     Imaging: No results found.  Speciality Comments: No specialty comments available.    Procedures:  No procedures performed Allergies: Clonidine derivatives; Fish allergy; Shellfish allergy; Indomethacin; Lyrica [pregabalin]; Methyldopa; Cetirizine hcl; Codeine; and Tomato   Assessment / Plan:     Visit Diagnoses: Rheumatoid arthritis with rheumatoid factor of multiple sites without organ or systems involvement ([place])  High risk medications (not anticoagulants) long-term use - 11/28/2016, Orencia failure after using it since March 13 2017December 2015: Applied for Enbrel but  approved for Humira and was started until 01/19/2017 - Plan: CBC with Differential/Platelet, Comprehensive metabolic panel  Bilateral wrist pain  Chronic pain of both shoulders  Chronic gout without tophus, unspecified cause, unspecified site  Pure hypercholesterolemia - Plan: Lipid panel   Plan: #1: Patient has severe rheumatoid arthritis with previous damage.  She has failed Orencia and has had multiple prednisone tapers recently (3 since September 2017 until today, 11/28/2016 was  ( Reviewing her chart shows patient initially visited [place] on 02/12/2013 at which time we started her on Plaquenil When that wasn't enough, we started her on methotrexate on August 2015 Then 10/16/2014, we applied for Enbrel but that was declined/denied and so gave her Humira. She did well with Humira until March 2017 we have to switch her to [place]. She had her first injection 01/20/2016 in our office. (Note that she had congestive heart failure and therefore we had to discontinue Humira) She did relatively well until recently when she started having frequent flares. On 07/22/2016 we give her prednisone: 4 pills for 5 days, 3 pills for 5, 2 pills for 5, 1 pills for 5, stop On 10/27/2016 we did prednisone 5 mg: 4 pills for 3 days, 3 for 3 days, 2 for 3 days, 1 for 3 days, stop On January can 2018, we did prednisone 5 mg: 444 days, 3 for 4 days, 2 for 4 days, 1 for 4 days, half for 4 days then stop. She finished a taper yesterday. She is doing great today. As a result of the frequent prednisone tapers recently, patient is feeling Orencia and we should switch her. Since she's failed to TNF/s (humira (stopped due to chf; orencia (tx failure), she would benefit from an alternative treatment (xeljanz vs actemra - will chose based on pt's preference and insurance response).  #2: We offered the patient Morrie Sheldon versus Actemra. We will have to check which medicine the insurance will cover. We discussed both of these medications and she is aware that the Morrie Sheldon is a pill versus Actemra infusions and there risks and benefits and her daughter, [name] [name], and the patient feel that it would be easier for the patient to take the xeljaznz pill and would like to go forward with that right now.  #3: CBC with differential, CMP with GFR, lipid panel to be done this week. Patient will return to clinic for that. She'll be fasting the night before so she can get blood work first thing in the morning  #4: Handout and  consent on Morrie Sheldon  #5: Applied for Morrie Sheldon (I will send a message to [employer], Pharm.D., to move forward with this and perhaps she can call [name] [name], daughter to discuss the risks and benefits of the Morrie Sheldon and also discussed with the patient or she may be able to speak with them together when they come in for blood work Monday morning. If [name] [name] is not here perhaps [name] [name], the other daughter who also brings the patient at times, can be told risks and benefits of the medication.  #6: Note: Actemra may be another option for the patient is Morrie Sheldon tonight  7: [name] gout is doing very well. No flare. Using allopurinol 100 mg daily.  #8: History of abnormal kidney function in the past. On August labs her kidney function was normal; on October/November labs her kidney functions are slightly low. We will monitor. She is on methotrexate 3 pills a week.  #9: Return to clinic in 3 months #10: Consider ultrasound of bilateral hands and  wrists 3-5 months after starting Actemra or Xeljanz.  Orders: Orders Placed This Encounter  Procedures  . CBC with Differential/Platelet  . Comprehensive metabolic panel  . Lipid panel   No orders of the defined types were placed in this encounter.   Face-to-face time spent with patient was 40 minutes. 50% of time was spent in counseling and coordination of care.  Follow-Up Instructions: Return in about 3 months (around 02/26/2017) for RA,failed orencia & humira,sj pain,.   Eliezer Lofts, PA-C  Note - This record has been created using [employer].  Chart creation errors have been sought, but may not always  have been located. Such creation errors do not reflect on  the standard of medical care.

## 2016-11-29 ENCOUNTER — Encounter (HOSPITAL_COMMUNITY): Payer: Self-pay | Admitting: Emergency Medicine

## 2016-11-29 ENCOUNTER — Emergency Department (HOSPITAL_COMMUNITY): Payer: Medicare Other

## 2016-11-29 ENCOUNTER — Inpatient Hospital Stay (HOSPITAL_COMMUNITY)
Admission: EM | Admit: 2016-11-29 | Discharge: 2016-12-01 | DRG: 194 | Disposition: A | Discharge status: Home Health | Payer: Medicare Other | Attending: Internal Medicine | Admitting: Internal Medicine

## 2016-11-29 DIAGNOSIS — Z9842 Cataract extraction status, left eye: Secondary | ICD-10-CM

## 2016-11-29 DIAGNOSIS — E118 Type 2 diabetes mellitus with unspecified complications: Secondary | ICD-10-CM

## 2016-11-29 DIAGNOSIS — R188 Other ascites: Secondary | ICD-10-CM

## 2016-11-29 DIAGNOSIS — E1121 Type 2 diabetes mellitus with diabetic nephropathy: Secondary | ICD-10-CM

## 2016-11-29 DIAGNOSIS — Z7984 Long term (current) use of oral hypoglycemic drugs: Secondary | ICD-10-CM

## 2016-11-29 DIAGNOSIS — R569 Unspecified convulsions: Secondary | ICD-10-CM | POA: Diagnosis present

## 2016-11-29 DIAGNOSIS — I440 Atrioventricular block, first degree: Secondary | ICD-10-CM | POA: Diagnosis present

## 2016-11-29 DIAGNOSIS — Z888 Allergy status to other drugs, medicaments and biological substances status: Secondary | ICD-10-CM

## 2016-11-29 DIAGNOSIS — R42 Dizziness and giddiness: Secondary | ICD-10-CM | POA: Diagnosis not present

## 2016-11-29 DIAGNOSIS — Z7982 Long term (current) use of aspirin: Secondary | ICD-10-CM

## 2016-11-29 DIAGNOSIS — Z96653 Presence of artificial knee joint, bilateral: Secondary | ICD-10-CM | POA: Diagnosis present

## 2016-11-29 DIAGNOSIS — Z91018 Allergy to other foods: Secondary | ICD-10-CM

## 2016-11-29 DIAGNOSIS — I2510 Atherosclerotic heart disease of native coronary artery without angina pectoris: Secondary | ICD-10-CM | POA: Diagnosis present

## 2016-11-29 DIAGNOSIS — Z8249 Family history of ischemic heart disease and other diseases of the circulatory system: Secondary | ICD-10-CM

## 2016-11-29 DIAGNOSIS — Z885 Allergy status to narcotic agent status: Secondary | ICD-10-CM

## 2016-11-29 DIAGNOSIS — J180 Bronchopneumonia, unspecified organism: Secondary | ICD-10-CM | POA: Diagnosis not present

## 2016-11-29 DIAGNOSIS — E1159 Type 2 diabetes mellitus with other circulatory complications: Secondary | ICD-10-CM | POA: Diagnosis present

## 2016-11-29 DIAGNOSIS — I152 Hypertension secondary to endocrine disorders: Secondary | ICD-10-CM | POA: Diagnosis present

## 2016-11-29 DIAGNOSIS — Z91013 Allergy to seafood: Secondary | ICD-10-CM

## 2016-11-29 DIAGNOSIS — I10 Essential (primary) hypertension: Secondary | ICD-10-CM | POA: Diagnosis not present

## 2016-11-29 DIAGNOSIS — N390 Urinary tract infection, site not specified: Secondary | ICD-10-CM | POA: Diagnosis present

## 2016-11-29 DIAGNOSIS — Z79899 Other long term (current) drug therapy: Secondary | ICD-10-CM

## 2016-11-29 DIAGNOSIS — Z961 Presence of intraocular lens: Secondary | ICD-10-CM | POA: Diagnosis present

## 2016-11-29 DIAGNOSIS — M109 Gout, unspecified: Secondary | ICD-10-CM | POA: Diagnosis present

## 2016-11-29 DIAGNOSIS — Z91048 Other nonmedicinal substance allergy status: Secondary | ICD-10-CM

## 2016-11-29 DIAGNOSIS — I252 Old myocardial infarction: Secondary | ICD-10-CM

## 2016-11-29 DIAGNOSIS — Z9841 Cataract extraction status, right eye: Secondary | ICD-10-CM

## 2016-11-29 DIAGNOSIS — E559 Vitamin D deficiency, unspecified: Secondary | ICD-10-CM | POA: Diagnosis present

## 2016-11-29 DIAGNOSIS — M1990 Unspecified osteoarthritis, unspecified site: Secondary | ICD-10-CM | POA: Diagnosis present

## 2016-11-29 DIAGNOSIS — E119 Type 2 diabetes mellitus without complications: Secondary | ICD-10-CM | POA: Diagnosis present

## 2016-11-29 DIAGNOSIS — E785 Hyperlipidemia, unspecified: Secondary | ICD-10-CM | POA: Diagnosis present

## 2016-11-29 DIAGNOSIS — Z8673 Personal history of transient ischemic attack (TIA), and cerebral infarction without residual deficits: Secondary | ICD-10-CM

## 2016-11-29 DIAGNOSIS — E1129 Type 2 diabetes mellitus with other diabetic kidney complication: Secondary | ICD-10-CM

## 2016-11-29 LAB — CBC WITH DIFFERENTIAL/PLATELET
Basophils Absolute: 0 10*3/uL (ref 0.0–0.1)
Basophils Relative: 0 %
Eosinophils Absolute: 0.2 10*3/uL (ref 0.0–0.7)
Eosinophils Relative: 2 %
HCT: 31.4 % — ABNORMAL LOW (ref 36.0–46.0)
Hemoglobin: 10.2 g/dL — ABNORMAL LOW (ref 12.0–15.0)
Lymphocytes Relative: 22 %
Lymphs Abs: 2 10*3/uL (ref 0.7–4.0)
MCH: 30.5 pg (ref 26.0–34.0)
MCHC: 32.5 g/dL (ref 30.0–36.0)
MCV: 94 fL (ref 78.0–100.0)
Monocytes Absolute: 0.5 10*3/uL (ref 0.1–1.0)
Monocytes Relative: 6 %
Neutro Abs: 6.5 10*3/uL (ref 1.7–7.7)
Neutrophils Relative %: 70 %
Platelets: 327 10*3/uL (ref 150–400)
RBC: 3.34 MIL/uL — ABNORMAL LOW (ref 3.87–5.11)
RDW: 20.1 % — ABNORMAL HIGH (ref 11.5–15.5)
WBC: 9.3 10*3/uL (ref 4.0–10.5)

## 2016-11-29 LAB — BASIC METABOLIC PANEL
Anion gap: 8 (ref 5–15)
BUN: 24 mg/dL — ABNORMAL HIGH (ref 6–20)
CO2: 23 mmol/L (ref 22–32)
Calcium: 9 mg/dL (ref 8.9–10.3)
Chloride: 107 mmol/L (ref 101–111)
Creatinine, Ser: 0.94 mg/dL (ref 0.44–1.00)
GFR calc Af Amer: >60 mL/min (ref >60)
GFR calc non Af Amer: 54 mL/min — ABNORMAL LOW (ref >60)
Glucose, Bld: 87 mg/dL (ref 65–99)
Potassium: 4.7 mmol/L (ref 3.5–5.1)
Sodium: 138 mmol/L (ref 135–145)

## 2016-11-29 LAB — URINALYSIS, ROUTINE W REFLEX MICROSCOPIC
Bilirubin Urine: NEGATIVE
Glucose, UA: NEGATIVE mg/dL
Hgb urine dipstick: NEGATIVE
Ketones, ur: NEGATIVE mg/dL
Nitrite: NEGATIVE
Protein, ur: NEGATIVE mg/dL
Specific Gravity, Urine: 1.009 (ref 1.005–1.030)
pH: 6 (ref 5.0–8.0)

## 2016-11-29 LAB — GLUCOSE, CAPILLARY: Glucose-Capillary: 96 mg/dL (ref 65–99)

## 2016-11-29 LAB — I-STAT TROPONIN, ED: Troponin i, poc: 0.01 ng/mL (ref 0.00–0.08)

## 2016-11-29 MED ORDER — ONDANSETRON HCL 4 MG/2ML IJ SOLN: 4.0000 mg | Freq: Four times a day (QID) | INTRAMUSCULAR | Status: DC | PRN

## 2016-11-29 MED ORDER — HYPROMELLOSE (GONIOSCOPIC) 2.5 % OP SOLN: 1.0000 [drp] | OPHTHALMIC | Status: DC | PRN

## 2016-11-29 MED ORDER — VANCOMYCIN HCL 10 G IV SOLR
1500.0000 mg | Freq: Once | INTRAVENOUS | Status: AC
  Administered 2016-11-29: 1500 mg via INTRAVENOUS
  Filled 2016-11-29: qty 1500

## 2016-11-29 MED ORDER — FUROSEMIDE 20 MG PO TABS
20.0000 mg | ORAL_TABLET | Freq: Every day | ORAL | Status: DC
  Administered 2016-11-30 – 2016-12-01 (×2): 20 mg via ORAL
  Filled 2016-11-29 (×2): qty 1

## 2016-11-29 MED ORDER — IPRATROPIUM-ALBUTEROL 0.5-2.5 (3) MG/3ML IN SOLN
3.0000 mL | Freq: Four times a day (QID) | RESPIRATORY_TRACT | Status: DC
  Administered 2016-11-30 (×2): 3 mL via RESPIRATORY_TRACT
  Filled 2016-11-29 (×3): qty 3

## 2016-11-29 MED ORDER — FOLIC ACID 1 MG PO TABS
1.0000 mg | ORAL_TABLET | Freq: Every day | ORAL | Status: DC
  Administered 2016-11-30 – 2016-12-01 (×2): 1 mg via ORAL
  Filled 2016-11-29 (×2): qty 1

## 2016-11-29 MED ORDER — ACETAMINOPHEN 325 MG PO TABS
650.0000 mg | ORAL_TABLET | Freq: Four times a day (QID) | ORAL | Status: DC | PRN
  Administered 2016-11-29: 650 mg via ORAL
  Filled 2016-11-29: qty 2

## 2016-11-29 MED ORDER — METHOTREXATE 2.5 MG PO TABS: 7.5000 mg | ORAL_TABLET | ORAL | Status: DC

## 2016-11-29 MED ORDER — VANCOMYCIN HCL 10 G IV SOLR
1250.0000 mg | INTRAVENOUS | Status: DC
  Administered 2016-11-30: 1250 mg via INTRAVENOUS
  Filled 2016-11-29: qty 1250

## 2016-11-29 MED ORDER — INSULIN ASPART 100 UNIT/ML ~~LOC~~ SOLN
0.0000 [IU] | Freq: Three times a day (TID) | SUBCUTANEOUS | Status: DC
  Administered 2016-11-30: 2 [IU] via SUBCUTANEOUS

## 2016-11-29 MED ORDER — DEXTROSE 5 % IV SOLN
1.0000 g | INTRAVENOUS | Status: DC
  Administered 2016-11-30 (×2): 1 g via INTRAVENOUS
  Filled 2016-11-29 (×3): qty 10

## 2016-11-29 MED ORDER — ACETAMINOPHEN 650 MG RE SUPP: 650.0000 mg | Freq: Four times a day (QID) | RECTAL | Status: DC | PRN

## 2016-11-29 MED ORDER — KCL IN DEXTROSE-NACL 20-5-0.45 MEQ/L-%-% IV SOLN
INTRAVENOUS | Status: AC
  Administered 2016-11-29: via INTRAVENOUS
  Filled 2016-11-29 (×2): qty 1000

## 2016-11-29 MED ORDER — POLYVINYL ALCOHOL 1.4 % OP SOLN: 1.0000 [drp] | OPHTHALMIC | Status: DC | PRN

## 2016-11-29 MED ORDER — POLYETHYLENE GLYCOL 3350 17 G PO PACK: 17.0000 g | PACK | Freq: Every day | ORAL | Status: DC | PRN

## 2016-11-29 MED ORDER — SODIUM CHLORIDE 0.9% FLUSH
3.0000 mL | Freq: Two times a day (BID) | INTRAVENOUS | Status: DC
  Administered 2016-11-29 – 2016-11-30 (×3): 3 mL via INTRAVENOUS

## 2016-11-29 MED ORDER — LISINOPRIL 20 MG PO TABS
30.0000 mg | ORAL_TABLET | Freq: Every day | ORAL | Status: DC
  Administered 2016-11-30 – 2016-12-01 (×2): 30 mg via ORAL
  Filled 2016-11-29 (×2): qty 1

## 2016-11-29 MED ORDER — ASPIRIN EC 81 MG PO TBEC
81.0000 mg | DELAYED_RELEASE_TABLET | Freq: Every day | ORAL | Status: DC
  Administered 2016-11-30 – 2016-12-01 (×2): 81 mg via ORAL
  Filled 2016-11-29 (×2): qty 1

## 2016-11-29 MED ORDER — LORAZEPAM 2 MG/ML IJ SOLN: 1.0000 mg | INTRAMUSCULAR | Status: DC | PRN

## 2016-11-29 MED ORDER — HYDRALAZINE HCL 25 MG PO TABS
25.0000 mg | ORAL_TABLET | Freq: Four times a day (QID) | ORAL | Status: DC
  Administered 2016-11-29 – 2016-12-01 (×6): 25 mg via ORAL
  Filled 2016-11-29 (×6): qty 1

## 2016-11-29 MED ORDER — ALBUTEROL SULFATE (2.5 MG/3ML) 0.083% IN NEBU: 2.5000 mg | INHALATION_SOLUTION | RESPIRATORY_TRACT | Status: DC | PRN

## 2016-11-29 MED ORDER — ENOXAPARIN SODIUM 40 MG/0.4ML ~~LOC~~ SOLN
40.0000 mg | SUBCUTANEOUS | Status: DC
  Administered 2016-11-29 – 2016-11-30 (×2): 40 mg via SUBCUTANEOUS
  Filled 2016-11-29 (×2): qty 0.4

## 2016-11-29 MED ORDER — ALLOPURINOL 100 MG PO TABS
100.0000 mg | ORAL_TABLET | Freq: Every day | ORAL | Status: DC
  Administered 2016-11-30 – 2016-12-01 (×2): 100 mg via ORAL
  Filled 2016-11-29 (×2): qty 1

## 2016-11-29 MED ORDER — LABETALOL HCL 100 MG PO TABS
100.0000 mg | ORAL_TABLET | Freq: Two times a day (BID) | ORAL | Status: DC
  Administered 2016-11-29 – 2016-12-01 (×4): 100 mg via ORAL
  Filled 2016-11-29 (×4): qty 1

## 2016-11-29 MED ORDER — ONDANSETRON HCL 4 MG PO TABS: 4.0000 mg | ORAL_TABLET | Freq: Four times a day (QID) | ORAL | Status: DC | PRN

## 2016-11-29 NOTE — ED Notes (Signed)
Patient family given coffee.

## 2016-11-29 NOTE — ED Notes (Signed)
Pt to CT

## 2016-11-29 NOTE — H&P (Signed)
[facility] History and Physical  KELLE BROWDER EXN:[id number] DOB: 1930/03/20 DOA: 11/29/2016  Referring physician:  PCP: Kirt Boys, DO   Chief Complaint: "I went to the doctor because my  face was swollen."  HPI: MELANEE KVALE is a 83 y.o. female  past medical history significant for gout, rheumatoid arthritis, muscle weakness, heart attack, seizures, stroke, coronary artery disease, heart block who presented to the emergency room for concerns of swollen face. Patient states she went to see her primary care doctor for evaluation of her swollen right face. Denies any trauma. Primary care physician advised her to go to emergency room for evaluation. After waiting in the lobby patient was seen at her face was not swollen. Patient has had a productive cough with sputum of unknown known color for the last "few" days. Patient denies any fever, chills, nausea, vomiting, rash, headache, blurry vision sore throat, runny nose. Patient is refusing any sick contacts.  ED course: Chest x-ray showed bronchopneumonia. Hospital was consulted for admission.   Review of Systems:  As per HPI otherwise 10 point review of systems negative.    Past Medical History:  Diagnosis Date  . Allergic rhinitis due to pollen 04/27/2007  . Arthritis    "in q joint" (08/07/2013)  . Coronary atherosclerosis of native coronary artery   . Cough   . Disorder of bone and cartilage, unspecified   . Edema 05/03/2013  . Exertional shortness of breath    "sometimes" (08/07/2013)  . First degree atrioventricular block   . Gout, unspecified 04/19/2013  . Hyperlipidemia 04/27/2007  . Hypertension   . Muscle spasm   . Muscle weakness (generalized)   . Myocardial infarction ~ 1970  . Onychia and paronychia of toe   . Osteoarthrosis, unspecified whether generalized or localized, unspecified site 04/27/2007  . Other fall   . Other malaise and fatigue   . Prepatellar bursitis   . Seizures (HCC)   . Stroke  (HCC) 01/17/2014  . TIA (transient ischemic attack)    "a few one summer" (08/07/2013)  . Type II diabetes mellitus (HCC)    "fasting 90-110s" (08/07/2013)  . Unspecified essential hypertension 08/08/2013  . Unspecified vitamin D deficiency    Past Surgical History:  Procedure Laterality Date  . ABDOMINAL HYSTERECTOMY  04/1980  . APPENDECTOMY  1960  . CARDIAC CATHETERIZATION  2003  . CATARACT EXTRACTION W/ INTRAOCULAR LENS  IMPLANT, BILATERAL Bilateral ~ 2012  . JOINT REPLACEMENT    . REPLACEMENT TOTAL KNEE Right 09/2005  . SHOULDER OPEN ROTATOR CUFF REPAIR Right 07/1999  . TONSILLECTOMY  08/1974  . TOTAL KNEE ARTHROPLASTY Left 08/07/2013  . TOTAL KNEE ARTHROPLASTY Left 08/07/2013   Procedure: TOTAL KNEE ARTHROPLASTY- LEFT;  Surgeon: Dannielle Huh, MD;  Location: [facility] OR;  Service: Orthopedics;  Laterality: Left;  . TRANSTHORACIC ECHOCARDIOGRAM  2003   EF 55-65%; mild concentric LVH   Social History:  reports that she has never smoked. She has never used smokeless tobacco. She reports that she does not drink alcohol or use drugs.  Allergies  Allergen Reactions  . Clonidine Derivatives Swelling    [name].    . Fish Allergy Swelling  . Shellfish Allergy Hives  . Indomethacin Other (See Comments)    unknown  . Lyrica [Pregabalin]     hallucinations  . Methyldopa Other (See Comments)    unknown  . Cetirizine Hcl Itching and Rash    unknown  . Codeine Itching  unknown  . Tomato Rash    Family History  Problem Relation Age of Onset  . Heart attack Father      Prior to Admission medications   Medication Sig Start Date End Date Taking? Authorizing Provider  albuterol (PROVENTIL HFA;VENTOLIN HFA) 108 (90 Base) MCG/ACT inhaler Inhale 1 puff into the lungs every 6 (six) hours as needed for wheezing or shortness of breath.   Yes Historical Provider, MD  allopurinol (ZYLOPRIM) 100 MG tablet Take one tablet by  mouth once daily for gout 09/21/16  Yes Naitik Panwala, PA-C  aspirin EC 81 MG tablet Take 81 mg by mouth daily.   Yes Historical Provider, MD  benzonatate (TESSALON) 100 MG capsule take 2 capsules by mouth three times a day if needed for cough 07/14/16  Yes Kirt Boys, DO  calcium-vitamin D (OSCAL WITH D) 500-200 MG-UNIT per tablet Take 1 tablet by mouth daily.    Yes Historical Provider, MD  Cholecalciferol (VITAMIN D-3) 1000 UNITS CAPS Take 1 capsule by mouth daily.   Yes Historical Provider, MD  ferrous sulfate 325 (65 FE) MG tablet Take 325 mg by mouth daily.    Yes Historical Provider, MD  folic acid (FOLVITE) 1 MG tablet Take 1 tablet (1 mg total) by mouth daily. 09/21/16 12/15/17 Yes Naitik Panwala, PA-C  furosemide (LASIX) 20 MG tablet take 1 tablet by mouth once daily 09/28/16  Yes Kirt Boys, DO  glucose blood (ONE TOUCH ULTRA TEST) test strip Check blood sugar once daily DX 250.00 05/18/14  Yes Tiffany L Reed, DO  hydrALAZINE (APRESOLINE) 25 MG tablet take 1 tablet by mouth four times a day 11/16/16  Yes Kirt Boys, DO  labetalol (NORMODYNE) 100 MG tablet take 2 tablet by mouth twice a day 06/22/16  Yes Kirt Boys, DO  lisinopril (PRINIVIL,ZESTRIL) 30 MG tablet take 1 tablet by mouth once daily 10/14/16  Yes Kimber Relic, MD  metFORMIN (GLUCOPHAGE) 500 MG tablet take 1 tablet by mouth once daily for diabetes 05/28/16  Yes Tiffany L Reed, DO  methotrexate (RHEUMATREX) 2.5 MG tablet Take 3 tablets (7.5 mg total) by mouth once a week. Caution:Chemotherapy. Protect from light. 09/21/16 12/08/16 Yes Naitik Panwala, PA-C  ORENCIA CLICKJECT 125 MG/ML SOAJ INJECT 1 PEN SUBCUTANEOUSLY WEEKLY 11/04/16  Yes Pollyann Savoy, MD  OVER THE COUNTER MEDICATION Take 1 tablet by mouth 2 (two) times daily. Wellness formula for immune support   Yes Historical Provider, MD  oxycodone (OXY-IR) 5 MG capsule Take 1 capsule (5 mg total) by mouth every 4 (four) hours as needed for pain (Arthritis pain).  09/02/16  Yes Kirt Boys, DO  polyvinyl alcohol (LIQUIFILM TEARS) 1.4 % ophthalmic solution Place 1 drop into both eyes as needed (for dry eyes).   Yes Historical Provider, MD  traMADol (ULTRAM) 50 MG tablet take 1-2 tablets by mouth every 6 hours for MODERATE pain Patient taking differently: Take 50 mg by mouth every 6 (six) hours as needed for moderate pain.  09/16/16  Yes Kirt Boys, DO  triamcinolone cream (KENALOG) 0.1 % apply to affected area twice a day if needed for IRRITATION 06/22/16  Yes Kimber Relic, MD  vitamin C (ASCORBIC ACID) 500 MG tablet Take 500 mg by mouth daily.   Yes Historical Provider, MD  predniSONE (DELTASONE) 5 MG tablet 4po qAM x 4 days, 3po qAM x 4 days, 2po qAM x 4 days, 1po qAM x 4 days, 1/2po qAM x 4 days, then stop. Patient not taking: Reported on 11/29/2016  11/11/16   Tawni Pummel, PA-C   Physical Exam: Vitals:   11/29/16 1700 11/29/16 1745 11/29/16 1815 11/29/16 1830  BP: 156/86 146/63 153/74 152/68  Pulse: 99 95 100 92  Resp: 15 19 19 17   Temp:      TempSrc:      SpO2: 100% 99% 97% 100%    Wt Readings from Last 3 Encounters:  11/28/16 83.5 kg (184 lb)  09/21/16 80.7 kg (178 lb)  09/16/16 81.5 kg (179 lb 9.6 oz)    General:  Appears calm and comfortable Eyes:  PERRL, EOMI, normal lids, iris ENT:  grossly normal hearing, lips & tongue Neck:  no LAD, masses or thyromegaly Cardiovascular:  RRR, no m/r/g. No LE edema.  Respiratory: Incr work of breathing, coarse breath sounds with rales and midlung field left-to-right. No wheezing. Use of accessory mucles Abdomen:  soft, ntnd Skin:  no rash or induration seen on limited exam Musculoskeletal:  grossly normal tone BUE/BLE Psychiatric:  grossly normal mood and affect, speech fluent and appropriate Neurologic:  CN 2-12 grossly intact, moves all extremities in coordinated fashion.          Labs on Admission:  Basic Metabolic Panel:  Recent Labs Lab 11/29/16 1223  NA 138  K 4.7  CL 107    CO2 23  GLUCOSE 87  BUN 24*  CREATININE 0.94  CALCIUM 9.0   Liver Function Tests: No results for input(s): AST, ALT, ALKPHOS, BILITOT, PROT, ALBUMIN in the last 168 hours. No results for input(s): LIPASE, AMYLASE in the last 168 hours. No results for input(s): AMMONIA in the last 168 hours. CBC:  Recent Labs Lab 11/29/16 1223  WBC 9.3  NEUTROABS 6.5  HGB 10.2*  HCT 31.4*  MCV 94.0  PLT 327   Cardiac Enzymes: No results for input(s): CKTOTAL, CKMB, CKMBINDEX, TROPONINI in the last 168 hours.  BNP (last 3 results) No results for input(s): BNP in the last 8760 hours.  ProBNP (last 3 results) No results for input(s): PROBNP in the last 8760 hours.   Serum creatinine: 0.94 mg/dL 12/01/16 09/38/18 Estimated creatinine clearance: 46.6 mL/min  CBG: No results for input(s): GLUCAP in the last 168 hours.  Radiological Exams on Admission: Dg Chest 2 View  Result Date: 11/29/2016 CLINICAL DATA:  Cough and fever over the last week. EXAM: CHEST  2 VIEW COMPARISON:  09/10/2015 FINDINGS: Cardiac silhouette is markedly enlarged. There is unfolding of the aorta. There are patchy infiltrates in both lungs consistent with bronchopneumonia. No consolidation or lobar collapse. No effusion. No acute bone finding. Extensive chronic and postoperative changes of the shoulders. IMPRESSION: Patchy bronchopneumonia bilaterally. Cardiomegaly and aortic atherosclerosis. Electronically Signed   By: 13/11/2014 [name]   On: 11/29/2016 13:47   Ct Head Wo Contrast  Result Date: 11/29/2016 CLINICAL DATA:  Acute onset dizziness today. Hypertension. Personal history of TIAs. EXAM: CT HEAD WITHOUT CONTRAST TECHNIQUE: Contiguous axial images were obtained from the base of the skull through the vertex without intravenous contrast. COMPARISON:  03/27/2015 FINDINGS: Brain: No evidence of acute infarction, hemorrhage, hydrocephalus, extra-axial collection or mass lesion/mass effect. Mild chronic small vessel disease  is stable in appearance. Vascular: No hyperdense vessel or unexpected calcification. Skull: Normal. Negative for fracture or focal lesion. Sinuses/Orbits: Small right mastoid effusion stable since previous study. Other: None. IMPRESSION: No acute intracranial abnormality. Stable mild chronic small vessel disease. Stable chronic right mastoid effusion. Electronically Signed   By: 03/29/2015 [name]   On: 11/29/2016 15:10  EKG: Independently reviewed. No STEMI. NSR w/ PVCs  Assessment/Plan Active Problems:   Bronchopneumonia  Pneumonia Scheduled DuoNeb's When necessary albuterol Antibiotic- vanc and rocephin to cover for possible staph pna based on CXR, based on labs and presentation not worried about atypical pna so no azithro Oxygen therapy  Continuous pulse oximetry Flutter and IS Blood cultures pending  Seizure Seizure precautions Ativan when necessary  Hypertension When necessary hydralazine 10 mg IV as needed for severe blood pressure Continue labetalol, lisinopril, Lasix  Gout  continue allopurinol  CAD/TIA Cont Aspirin  DM SSI ACHS  Code Status: FULL  DVT Prophylaxis: lovenox Family Communication: at bedisde Disposition Plan: Pending Improvement  Status: obs, tele  Haydee Salter, MD Family Medicine [facility] [email or web address] Password [email or web address]

## 2016-11-29 NOTE — ED Triage Notes (Signed)
Patient presents today from home states spoke with PCP and advised her face was swollen and dizzy. Patient states has hx of TIA. Patient denies any dizziness, headache, Chest pain or SOB. Patient states she did have a headache last night. Patient states she fells the swelling has went down in the face. Vitals with EMS BP 98/80 HR 70 RR 18 CBG 112. Patient alert and oriented on arrival moving all extremities.

## 2016-11-29 NOTE — ED Provider Notes (Signed)
[facility]-EMERGENCY DEPT Provider Note   CSN: [id number] Arrival date & time: 11/29/16  1147     History   Chief Complaint Chief Complaint  Patient presents with  . Dizziness    HPI [name] [name] is a 83 y.o. female.  Asian is an 83 year old female with past medical history of hypertension, rheumatoid arthritis, type 2 diabetes. She presents today for evaluation of weakness and dizziness. This started 2 days ago and has worsened. She states that she feels weak when she ambulates but denies any chest pain or shortness of breath. She has had a slight cough which has been nonproductive. She denies any fevers or chills. According to the daughter at bedside, she is "not herself".   The history is provided by the patient.  Dizziness  Quality:  Lightheadedness Severity:  Moderate Onset quality:  Gradual Duration:  2 days Timing:  Constant Progression:  Worsening Chronicity:  New Relieved by:  Nothing Worsened by:  Nothing Ineffective treatments:  None tried   Past Medical History:  Diagnosis Date  . Allergic rhinitis due to pollen 04/27/2007  . Arthritis    "in q joint" (08/07/2013)  . Coronary atherosclerosis of native coronary artery   . Cough   . Disorder of bone and cartilage, unspecified   . Edema 05/03/2013  . Exertional shortness of breath    "sometimes" (08/07/2013)  . First degree atrioventricular block   . Gout, unspecified 04/19/2013  . Hyperlipidemia 04/27/2007  . Hypertension   . Muscle spasm   . Muscle weakness (generalized)   . Myocardial infarction ~ 1970  . Onychia and paronychia of toe   . Osteoarthrosis, unspecified whether generalized or localized, unspecified site 04/27/2007  . Other fall   . Other malaise and fatigue   . Prepatellar bursitis   . Seizures (HCC)   . Stroke (HCC) 01/17/2014  . TIA (transient ischemic attack)    "a few one summer" (08/07/2013)  . Type II diabetes mellitus (HCC)    "fasting 90-110s" (08/07/2013)  . Unspecified  essential hypertension 08/08/2013  . Unspecified vitamin D deficiency     Patient Active Problem List   Diagnosis Date Noted  . High risk medication use 11/23/2016  . High risk medications (not anticoagulants) long-term use 09/21/2016  . Hammer toe 10/31/2015  . Pain in lower limb 10/31/2015  . Acute on chronic diastolic heart failure (HCC) 09/12/2015  . Chest pain 09/11/2015  . Avulsion of left hamstring muscle 06/20/2015  . Avulsion of hamstring muscle 06/19/2015  . Onychomycosis 04/24/2015  . Type 2 diabetes mellitus with microalbuminuria or microproteinuria 11/27/2014  . Absolute anemia 07/03/2014  . Midline low back pain without sciatica 06/27/2014  . Bilateral edema of lower extremity 06/05/2014  . Chronic steroid use 06/05/2014  . CKD (chronic kidney disease) stage 3, GFR 30-59 ml/min 06/05/2014  . Type 2 diabetes mellitus with other diabetic kidney complication (HCC) 06/05/2014  . Hypertensive renal disease 06/05/2014  . Acute sinus infection 05/08/2014  . Tachycardia 05/01/2014  . Anemia of chronic disease 05/01/2014  . Fluctuating blood pressure 02/06/2014  . Dizziness 02/06/2014  . Stroke (HCC) 01/17/2014  . Syncope and collapse 01/17/2014  . Rheumatoid arthritis (HCC) 12/19/2013  . CAD (coronary artery disease) 10/18/2013  . Unspecified arthropathy, lower leg 09/13/2013  . Other specified disease of white blood cells 09/13/2013  . Cold intolerance 09/12/2013  . Anemia 09/12/2013  . Need for prophylactic vaccination and inoculation against influenza 09/12/2013  . Acute blood loss anemia 08/15/2013  .  Acute post-hemorrhagic anemia 08/09/2013  . HTN (hypertension) 08/08/2013  . Hyponatremia 08/08/2013  . Preoperative clearance 07/11/2013  . Myalgia and myositis 05/30/2013  . Fibromyalgia 05/10/2013  . Acute gout 05/03/2013  . Acute bronchitis 05/03/2013  . Edema 05/03/2013  . Reactive airway disease 04/19/2013  . Gout 04/19/2013  . HYPERLIPIDEMIA 04/27/2007    . Essential hypertension 04/27/2007  . Allergic rhinitis 04/27/2007  . DIVERTICULOSIS, COLON 04/27/2007  . Osteoarthritis 04/27/2007    Past Surgical History:  Procedure Laterality Date  . ABDOMINAL HYSTERECTOMY  04/1980  . APPENDECTOMY  1960  . CARDIAC CATHETERIZATION  2003  . CATARACT EXTRACTION W/ INTRAOCULAR LENS  IMPLANT, BILATERAL Bilateral ~ 2012  . JOINT REPLACEMENT    . REPLACEMENT TOTAL KNEE Right 09/2005  . SHOULDER OPEN ROTATOR CUFF REPAIR Right 07/1999  . TONSILLECTOMY  08/1974  . TOTAL KNEE ARTHROPLASTY Left 08/07/2013  . TOTAL KNEE ARTHROPLASTY Left 08/07/2013   Procedure: TOTAL KNEE ARTHROPLASTY- LEFT;  Surgeon: Dannielle Huh, MD;  Location: [facility] OR;  Service: Orthopedics;  Laterality: Left;  . TRANSTHORACIC ECHOCARDIOGRAM  2003   EF 55-65%; mild concentric LVH    OB History    No data available       Home Medications    Prior to Admission medications   Medication Sig Start Date End Date Taking? Authorizing Provider  ALBUTEROL SULFATE HFA IN Inhale into the lungs.    Historical Provider, MD  allopurinol (ZYLOPRIM) 100 MG tablet Take one tablet by mouth once daily for gout 09/21/16   Naitik Panwala, PA-C  aspirin EC 81 MG tablet Take 81 mg by mouth daily.    Historical Provider, MD  benzonatate (TESSALON) 100 MG capsule take 2 capsules by mouth three times a day if needed for cough 07/14/16   Kirt Boys, DO  calcium-vitamin D (OSCAL WITH D) 500-200 MG-UNIT per tablet Take 1 tablet by mouth daily.     Historical Provider, MD  Cholecalciferol (VITAMIN D-3) 1000 UNITS CAPS Take 1 capsule by mouth daily.    Historical Provider, MD  ferrous sulfate 325 (65 FE) MG tablet Take 325 mg by mouth daily.     Historical Provider, MD  folic acid (FOLVITE) 1 MG tablet Take 1 tablet (1 mg total) by mouth daily. 09/21/16 12/15/17  Naitik Panwala, PA-C  furosemide (LASIX) 20 MG tablet take 1 tablet by mouth once daily 09/28/16   Kirt Boys, DO  glucose blood (ONE TOUCH ULTRA TEST)  test strip Check blood sugar once daily DX 250.00 05/18/14   Tiffany L Reed, DO  hydrALAZINE (APRESOLINE) 25 MG tablet take 1 tablet by mouth four times a day 11/16/16   Kirt Boys, DO  labetalol (NORMODYNE) 100 MG tablet take 2 tablet by mouth twice a day 06/22/16   Kirt Boys, DO  lisinopril (PRINIVIL,ZESTRIL) 30 MG tablet take 1 tablet by mouth once daily 10/14/16   Kimber Relic, MD  metFORMIN (GLUCOPHAGE) 500 MG tablet take 1 tablet by mouth once daily for diabetes 05/28/16   Tiffany L Reed, DO  methotrexate (RHEUMATREX) 2.5 MG tablet Take 3 tablets (7.5 mg total) by mouth once a week. Caution:Chemotherapy. Protect from light. 09/21/16 12/08/16  Naitik Panwala, PA-C  ORENCIA CLICKJECT 125 MG/ML SOAJ INJECT 1 PEN SUBCUTANEOUSLY WEEKLY 11/04/16   Pollyann Savoy, MD  OVER THE COUNTER MEDICATION Take 1 tablet by mouth 2 (two) times daily. Wellness formula for immune support    Historical Provider, MD  OVER THE COUNTER MEDICATION Take 5 mLs by mouth every  morning. Chloraphil liquid, mix in apply juice    Historical Provider, MD  oxycodone (OXY-IR) 5 MG capsule Take 1 capsule (5 mg total) by mouth every 4 (four) hours as needed for pain (Arthritis pain). 09/02/16   Kirt Boys, DO  polyvinyl alcohol (LIQUIFILM TEARS) 1.4 % ophthalmic solution Place 1 drop into both eyes as needed (for dry eyes).    Historical Provider, MD  predniSONE (DELTASONE) 5 MG tablet 4po qAM x 4 days, 3po qAM x 4 days, 2po qAM x 4 days, 1po qAM x 4 days, 1/2po qAM x 4 days, then stop. 11/11/16   Naitik Panwala, PA-C  traMADol (ULTRAM) 50 MG tablet take 1-2 tablets by mouth every 6 hours for MODERATE pain 09/16/16   Kirt Boys, DO  triamcinolone cream (KENALOG) 0.1 % apply to affected area twice a day if needed for IRRITATION 06/22/16   Kimber Relic, MD  vitamin C (ASCORBIC ACID) 500 MG tablet Take 500 mg by mouth daily.    Historical Provider, MD    Family History Family History  Problem Relation Age of Onset  . Heart  attack Father     Social History Social History  Substance Use Topics  . Smoking status: Never Smoker  . Smokeless tobacco: Never Used  . Alcohol use No     Allergies   Clonidine derivatives; Fish allergy; Shellfish allergy; Indomethacin; Lyrica [pregabalin]; Methyldopa; Cetirizine hcl; Codeine; and Tomato   Review of Systems Review of Systems  Neurological: Positive for dizziness.  All other systems reviewed and are negative.    Physical Exam Updated Vital Signs BP 180/78 (BP Location: Right Arm)   Pulse 92   Temp 98.9 F (37.2 C) (Oral)   Resp 16   SpO2 100%   Physical Exam  Constitutional: She is oriented to person, place, and time. She appears well-developed and well-nourished. No distress.  HENT:  Head: Normocephalic and atraumatic.  Mouth/Throat: Oropharynx is clear and moist.  Eyes: EOM are normal. Pupils are equal, round, and reactive to light.  Neck: Normal range of motion. Neck supple.  Cardiovascular: Normal rate and regular rhythm.  Exam reveals no gallop and no friction rub.   No murmur heard. Pulmonary/Chest: Effort normal and breath sounds normal. No respiratory distress. She has no wheezes.  Abdominal: Soft. Bowel sounds are normal. She exhibits no distension. There is no tenderness.  Musculoskeletal: Normal range of motion. She exhibits no edema.  Neurological: She is alert and oriented to person, place, and time. No cranial nerve deficit. She exhibits normal muscle tone. Coordination normal.  Skin: Skin is warm and dry. She is not diaphoretic.  Nursing note and vitals reviewed.    ED Treatments / Results  Labs (all labs ordered are listed, but only abnormal results are displayed) Labs Reviewed  BASIC METABOLIC PANEL  CBC WITH DIFFERENTIAL/PLATELET  URINALYSIS, ROUTINE W REFLEX MICROSCOPIC  I-STAT TROPOININ, ED    EKG  EKG Interpretation None       Radiology No results found.  Procedures Procedures (including critical care  time)  Medications Ordered in ED Medications - No data to display   Initial Impression / Assessment and Plan / ED Course  I have reviewed the triage vital signs and the nursing notes.  Pertinent labs & imaging results that were available during my care of the patient were reviewed by me and considered in my medical decision making (see chart for details).     She will be admitted for treatment of a urinary tract  infection. Hospitalist agrees to admit.  Final Clinical Impressions(s) / ED Diagnoses   Final diagnoses:  None    New Prescriptions New Prescriptions   No medications on file     Geoffery Lyons, MD 11/30/16 [phone]

## 2016-11-29 NOTE — ED Notes (Signed)
Pt eating dinner tray °

## 2016-11-29 NOTE — ED Notes (Signed)
Dinner tray ordered for patient.

## 2016-11-29 NOTE — ED Notes (Signed)
Floor RN states pt can not come upstairs until BP is stable.

## 2016-11-29 NOTE — Progress Notes (Signed)
Pharmacy Antibiotic Note [name] [name] is a 83 y.o. female admitted on 11/29/2016 with pneumonia.  Pharmacy has been consulted for ceftriaxone and vancomycin dosing.  Plan: 1. Vancomycin 1250 IV every 24 hours.  Goal trough 15-20 mcg/mL.  2. Ceftriaxone 1 gram IV q24H      Temp (24hrs), Avg:98.9 F (37.2 C), Min:98.9 F (37.2 C), Max:98.9 F (37.2 C)   Recent Labs Lab 11/29/16 1223  WBC 9.3  CREATININE 0.94    Estimated Creatinine Clearance: 46.6 mL/min (by C-G formula based on SCr of 0.94 mg/dL).    Allergies  Allergen Reactions  . Clonidine Derivatives Swelling    [name].    . Fish Allergy Swelling  . Shellfish Allergy Hives  . Indomethacin Other (See Comments)    unknown  . Lyrica [Pregabalin]     hallucinations  . Methyldopa Other (See Comments)    unknown  . Cetirizine Hcl Itching and Rash    unknown  . Codeine Itching    unknown  . Tomato Rash   Antimicrobials this admission:  1/21 Ceftriaxone >>  1/21 vancomycin  >>   Microbiology results:  px   Thank you for allowing pharmacy to be a part of this patient's care.  Pollyann Samples, PharmD, BCPS 11/29/2016, 7:45 PM

## 2016-11-29 NOTE — ED Notes (Signed)
Patient at CT

## 2016-11-30 ENCOUNTER — Telehealth: Payer: Self-pay | Admitting: Rheumatology

## 2016-11-30 ENCOUNTER — Telehealth: Payer: Self-pay | Admitting: Pharmacist

## 2016-11-30 ENCOUNTER — Encounter (HOSPITAL_COMMUNITY): Payer: Self-pay

## 2016-11-30 ENCOUNTER — Inpatient Hospital Stay (HOSPITAL_COMMUNITY): Payer: Medicare Other

## 2016-11-30 DIAGNOSIS — Z961 Presence of intraocular lens: Secondary | ICD-10-CM | POA: Diagnosis present

## 2016-11-30 DIAGNOSIS — M1990 Unspecified osteoarthritis, unspecified site: Secondary | ICD-10-CM | POA: Diagnosis present

## 2016-11-30 DIAGNOSIS — Z885 Allergy status to narcotic agent status: Secondary | ICD-10-CM | POA: Diagnosis not present

## 2016-11-30 DIAGNOSIS — I440 Atrioventricular block, first degree: Secondary | ICD-10-CM | POA: Diagnosis present

## 2016-11-30 DIAGNOSIS — I252 Old myocardial infarction: Secondary | ICD-10-CM | POA: Diagnosis not present

## 2016-11-30 DIAGNOSIS — Z8249 Family history of ischemic heart disease and other diseases of the circulatory system: Secondary | ICD-10-CM | POA: Diagnosis not present

## 2016-11-30 DIAGNOSIS — Z91048 Other nonmedicinal substance allergy status: Secondary | ICD-10-CM | POA: Diagnosis not present

## 2016-11-30 DIAGNOSIS — E119 Type 2 diabetes mellitus without complications: Secondary | ICD-10-CM | POA: Diagnosis present

## 2016-11-30 DIAGNOSIS — Z91018 Allergy to other foods: Secondary | ICD-10-CM | POA: Diagnosis not present

## 2016-11-30 DIAGNOSIS — Z79899 Other long term (current) drug therapy: Secondary | ICD-10-CM | POA: Diagnosis not present

## 2016-11-30 DIAGNOSIS — E559 Vitamin D deficiency, unspecified: Secondary | ICD-10-CM | POA: Diagnosis present

## 2016-11-30 DIAGNOSIS — I2510 Atherosclerotic heart disease of native coronary artery without angina pectoris: Secondary | ICD-10-CM | POA: Diagnosis present

## 2016-11-30 DIAGNOSIS — R42 Dizziness and giddiness: Secondary | ICD-10-CM | POA: Diagnosis present

## 2016-11-30 DIAGNOSIS — Z9841 Cataract extraction status, right eye: Secondary | ICD-10-CM | POA: Diagnosis not present

## 2016-11-30 DIAGNOSIS — M109 Gout, unspecified: Secondary | ICD-10-CM | POA: Diagnosis present

## 2016-11-30 DIAGNOSIS — E785 Hyperlipidemia, unspecified: Secondary | ICD-10-CM | POA: Diagnosis present

## 2016-11-30 DIAGNOSIS — Z8673 Personal history of transient ischemic attack (TIA), and cerebral infarction without residual deficits: Secondary | ICD-10-CM | POA: Diagnosis not present

## 2016-11-30 DIAGNOSIS — J180 Bronchopneumonia, unspecified organism: Principal | ICD-10-CM

## 2016-11-30 DIAGNOSIS — N390 Urinary tract infection, site not specified: Secondary | ICD-10-CM | POA: Diagnosis present

## 2016-11-30 DIAGNOSIS — Z9842 Cataract extraction status, left eye: Secondary | ICD-10-CM | POA: Diagnosis not present

## 2016-11-30 DIAGNOSIS — I10 Essential (primary) hypertension: Secondary | ICD-10-CM | POA: Diagnosis present

## 2016-11-30 DIAGNOSIS — Z888 Allergy status to other drugs, medicaments and biological substances status: Secondary | ICD-10-CM | POA: Diagnosis not present

## 2016-11-30 DIAGNOSIS — R569 Unspecified convulsions: Secondary | ICD-10-CM | POA: Diagnosis present

## 2016-11-30 DIAGNOSIS — Z91013 Allergy to seafood: Secondary | ICD-10-CM | POA: Diagnosis not present

## 2016-11-30 DIAGNOSIS — Z96653 Presence of artificial knee joint, bilateral: Secondary | ICD-10-CM | POA: Diagnosis present

## 2016-11-30 LAB — GLUCOSE, CAPILLARY
Glucose-Capillary: 109 mg/dL — ABNORMAL HIGH (ref 65–99)
Glucose-Capillary: 100 mg/dL — ABNORMAL HIGH (ref 65–99)
Glucose-Capillary: 144 mg/dL — ABNORMAL HIGH (ref 65–99)
Glucose-Capillary: 92 mg/dL (ref 65–99)

## 2016-11-30 LAB — COMPREHENSIVE METABOLIC PANEL
ALT: 14 U/L (ref 14–54)
AST: 19 U/L (ref 15–41)
Albumin: 3 g/dL — ABNORMAL LOW (ref 3.5–5.0)
Alkaline Phosphatase: 48 U/L (ref 38–126)
Anion gap: 8 (ref 5–15)
BUN: 19 mg/dL (ref 6–20)
CO2: 24 mmol/L (ref 22–32)
Calcium: 8.8 mg/dL — ABNORMAL LOW (ref 8.9–10.3)
Chloride: 107 mmol/L (ref 101–111)
Creatinine, Ser: 0.94 mg/dL (ref 0.44–1.00)
GFR calc Af Amer: >60 mL/min (ref >60)
GFR calc non Af Amer: 54 mL/min — ABNORMAL LOW (ref >60)
Glucose, Bld: 109 mg/dL — ABNORMAL HIGH (ref 65–99)
Potassium: 3.7 mmol/L (ref 3.5–5.1)
Sodium: 139 mmol/L (ref 135–145)
Total Bilirubin: 1 mg/dL (ref 0.3–1.2)
Total Protein: 6 g/dL — ABNORMAL LOW (ref 6.5–8.1)

## 2016-11-30 MED ORDER — TRAMADOL HCL 50 MG PO TABS
50.0000 mg | ORAL_TABLET | Freq: Four times a day (QID) | ORAL | Status: DC | PRN
  Administered 2016-12-01: 50 mg via ORAL
  Filled 2016-11-30: qty 1

## 2016-11-30 MED ORDER — IPRATROPIUM-ALBUTEROL 0.5-2.5 (3) MG/3ML IN SOLN: 3.0000 mL | Freq: Four times a day (QID) | RESPIRATORY_TRACT | Status: DC | PRN

## 2016-11-30 NOTE — Telephone Encounter (Signed)
  I was on-call this weekend and on 11/29/2016 I received a phone call from Neita Goodnight daughter at 1033am  I tried to call her back and her phone line was busy on 6 or 7 occasions but I was able to speak with her on the eighth call.  Para March notice her mother had the side of her face drooping and her skin to be clammy with blood pressure 105/56. She tried to call her PCP but the ECP was unavailable and called our office.  After discussing the symptoms with Para March, I advised them that she needed to be seen in the emergency department for full evaluation and treatment. Based on her symptoms over the phone, my differential diagnosis included stroke. Based on that, I advised patient to be given aspirin 325 mg chew and swallow. Simultaneously somebody should be calling the EMS for transport to the hospital.  I called the patient today and spoke with Para March again. She summarized that her mother was admitted to the hospital. Initial diagnosis includes pneumonia. She still had drooping on one side of her face. They're scheduling an imaging study for the patient's head this morning. I still suspect that the patient may have a stroke or mini stroke.  Either way, I'm glad she went to the hospital to get evaluated and treated.  In the meanwhile, I advised Para March to not give her mom methotrexate for this week. Line I advised her that her pneumonia needs to be resolved before we should restart the methotrexate.  If there are any questions or concerns, she is welcome to call our office for guidance.  Para March also mentions that the doctors at the emergency room advise her that because it is a flu epidemic going on, it might be safer for the patient to be sent home once she stable to minimize risk of contracting the flu.  She also stated that they may be giving her antibiotic through the IV in the hospital and  by mouth at discharge.  I advised Para March the patient was scheduled to come into  our office for labs however that has to wait until she is better from her pneumonia.  Para March felt that she would have blood drawn at the hospital and figured this would work for the scheduled blood draw at our office. I reminded Para March that the blood drawn at the hospital was for pneumonia and other evaluation. The blood draw we need is to start her on medication in the future. She understands and is agreeable and will bring it patient and accordingly when she stable.  Tawni Pummel, PA-C

## 2016-11-30 NOTE — Telephone Encounter (Signed)
Received return phone call from patient's daughter Elease Hashimoto.  I reviewed the purpose, proper use, and adverse effects of Xeljanz with Elease Hashimoto.  Reviewed with her that Mr. Leane Call wants the patient to take Xeljanz 5 mg once daily.  Reviewed the most common adverse effects including infection, diarrhea, headaches.  Also reviewed rare adverse effects such as bowel injury and the need to contact us if she develops stomach pain during treatment.  Reviewed with patient that there is the possibility of an increased risk of malignancy but it is not well understood if this increased risk is due to the medication or the disease state.  Discussed importance of regular lab work while on [place].  Advised patient to avoid live vaccines while on [place].  Elease Hashimoto confirms patient had flu vaccine this year and has previously had the shingles vaccine.  Elease Hashimoto voiced understanding and denied any questions regarding the medication.  Elease Hashimoto reports she and her mother reviewed the information provided during their visit on 11/28/16 about [name] and have decided they want to proceed with the medication.  Consent has been signed by patient and will be uploaded into her chart.    Reviewed patient's chart.  TB Gold was negative on 07/03/16 and hepatitis panel was negative on 12/12/2013.  An order for a fasting lipid panel has been placed, but patient has been unable to come get lab work as she is in the hospital.  Noted patient is currently in the hospital for pneumonia.  Patient is aware she should hold both methotrexate and Orencia while she is sick.  I advised Elease Hashimoto that we will not want to start [name] for a least a week after her last Orencia injection (last dose 11/25/16) and until patient has completed the prescribed antibiotic course for pneumonia and is back to normal.  Elease Hashimoto is also aware that patient will need to get fasting lipid panel drawn before [facility] prescription can be sent to the pharmacy.  Elease Hashimoto voiced  understanding and reports patient will probably come for labs next week.  Discussed pharmacies for filling [name] prescription.  Elease Hashimoto requests for prescription to go to [facility].     Lilla Shook, Pharm.D., BCPS, CPP Clinical Pharmacist Pager: [phone] Phone: [phone] 11/30/2016 5:35 PM

## 2016-11-30 NOTE — Progress Notes (Signed)
Submitted a Prior Authorization to [employer] for [name] through CoverMyMeds on 11/30/16. She has been approved from 09/01/2016 through 11/30/2019.   Will send documents to scan center  Abran Duke, CPhT

## 2016-11-30 NOTE — Telephone Encounter (Signed)
I called patient's daughter, Para March, to discuss [name].  I informed her that [name] was approved by patient's insurance.  I wanted to discuss the medication further, but she was unable to talk at this time.  She reports she will call me back soon.

## 2016-11-30 NOTE — Progress Notes (Signed)
PROGRESS NOTE    [name] [name]  GQQ:[id number] DOB: 1930-10-22 DOA: 11/29/2016 PCP: Kirt Boys, DO    Brief Narrative: [name] [name] is a 83 y.o. female  past medical history significant for gout, rheumatoid arthritis, muscle weakness, heart attack, seizures, stroke, coronary artery disease, heart block who presented to the emergency room for concerns of swollen face. Patient states she went to see her primary care doctor for evaluation of her swollen right face. Denies any trauma. Primary care physician advised her to go to emergency room for evaluation. After waiting in the lobby patient was seen at her face was not swollen. Patient has had a productive cough with sputum of unknown known color for the last "few" days. Patient denies any fever, chills, nausea, vomiting, rash, headache, blurry vision sore throat, runny nose. Patient is refusing any sick contacts.  ED course: Chest x-ray showed bronchopneumonia. Hospital was consulted for admission.    Assessment & Plan:   Active Problems:   HTN (hypertension)   DM (diabetes mellitus) (HCC)   Bronchopneumonia  Broncho-Pnaeumonia.  Continue with vancomycin and zosyn.   Right face swelling, facial drop;  Mild tenderness right side neck.  Decline MRI.  No focal deficit notice.   Chronic mastoid effusion;  Continue with antibiotics.  Needs follow up with ENT>   Hypertension When necessary hydralazine 10 mg IV as needed for severe blood pressure Continue labetalol, lisinopril, Lasix  Gout  continue allopurinol  CAD/TIA Cont Aspirin  DM SSI ACHS    DVT prophylaxis: lovenox Code Status: full code Family Communication: care discussed with daughter  Disposition Plan: home in 24 hours.    Consultants:   none   Procedures: none   Antimicrobials: vancomycin and Zosyn    Subjective: No facial droop appreciated.  Report mild pain right side of her face.  Does not wants to proceed with MRI>     Objective: Vitals:   11/30/16 0221 11/30/16 0440 11/30/16 1146 11/30/16 1329  BP:  (!) 165/71 (!) 162/71   Pulse:  82 80   Resp:  18 18   Temp:  98.2 F (36.8 C) 98.9 F (37.2 C)   TempSrc:  Oral Oral   SpO2: 99% 98% 100% 96%  Weight:  78.8 kg (173 lb 12.8 oz)    Height:        Intake/Output Summary (Last 24 hours) at 11/30/16 1553 Last data filed at 11/30/16 1520  Gross per 24 hour  Intake              650 ml  Output             1500 ml  Net             -850 ml   Filed Weights   11/29/16 2331 11/30/16 0440  Weight: 80 kg (176 lb 6.4 oz) 78.8 kg (173 lb 12.8 oz)    Examination:  General exam: Appears calm and comfortable  Respiratory system: Clear to auscultation. Respiratory effort normal. Cardiovascular system: S1 & S2 heard, RRR. No JVD, murmurs, rubs, gallops or clicks. No pedal edema. Gastrointestinal system: Abdomen is nondistended, soft and nontender. No organomegaly or masses felt. Normal bowel sounds heard. Central nervous system: Alert and oriented. No focal neurological deficits. Extremities: Symmetric 5 x 5 power. Skin: No rashes, lesions or ulcers Psychiatry: Judgement and insight appear normal. Mood & affect appropriate.     Data Reviewed: I have personally reviewed following labs and imaging studies  CBC:  Recent Labs  Lab 11/29/16 1223  WBC 9.3  NEUTROABS 6.5  HGB 10.2*  HCT 31.4*  MCV 94.0  PLT 327   Basic Metabolic Panel:  Recent Labs Lab 11/29/16 1223 11/30/16 0531  NA 138 139  K 4.7 3.7  CL 107 107  CO2 23 24  GLUCOSE 87 109*  BUN 24* 19  CREATININE 0.94 0.94  CALCIUM 9.0 8.8*   GFR: Estimated Creatinine Clearance: 45.2 mL/min (by C-G formula based on SCr of 0.94 mg/dL). Liver Function Tests:  Recent Labs Lab 11/30/16 0531  AST 19  ALT 14  ALKPHOS 48  BILITOT 1.0  PROT 6.0*  ALBUMIN 3.0*   No results for input(s): LIPASE, AMYLASE in the last 168 hours. No results for input(s): AMMONIA in the last 168  hours. Coagulation Profile: No results for input(s): INR, PROTIME in the last 168 hours. Cardiac Enzymes: No results for input(s): CKTOTAL, CKMB, CKMBINDEX, TROPONINI in the last 168 hours. BNP (last 3 results) No results for input(s): PROBNP in the last 8760 hours. HbA1C: No results for input(s): HGBA1C in the last 72 hours. CBG:  Recent Labs Lab 11/29/16 2311 11/30/16 0554 11/30/16 1150  GLUCAP 96 109* 100*   Lipid Profile: No results for input(s): CHOL, HDL, LDLCALC, TRIG, CHOLHDL, LDLDIRECT in the last 72 hours. Thyroid Function Tests: No results for input(s): TSH, T4TOTAL, FREET4, T3FREE, THYROIDAB in the last 72 hours. Anemia Panel: No results for input(s): VITAMINB12, FOLATE, FERRITIN, TIBC, IRON, RETICCTPCT in the last 72 hours. Sepsis Labs: No results for input(s): PROCALCITON, LATICACIDVEN in the last 168 hours.  No results found for this or any previous visit (from the past 240 hour(s)).       Radiology Studies: Dg Chest 2 View  Result Date: 11/29/2016 CLINICAL DATA:  Cough and fever over the last week. EXAM: CHEST  2 VIEW COMPARISON:  09/10/2015 FINDINGS: Cardiac silhouette is markedly enlarged. There is unfolding of the aorta. There are patchy infiltrates in both lungs consistent with bronchopneumonia. No consolidation or lobar collapse. No effusion. No acute bone finding. Extensive chronic and postoperative changes of the shoulders. IMPRESSION: Patchy bronchopneumonia bilaterally. Cardiomegaly and aortic atherosclerosis. Electronically Signed   By: Paulina Fusi [name]   On: 11/29/2016 13:47   Ct Head Wo Contrast  Result Date: 11/29/2016 CLINICAL DATA:  Acute onset dizziness today. Hypertension. Personal history of TIAs. EXAM: CT HEAD WITHOUT CONTRAST TECHNIQUE: Contiguous axial images were obtained from the base of the skull through the vertex without intravenous contrast. COMPARISON:  03/27/2015 FINDINGS: Brain: No evidence of acute infarction, hemorrhage,  hydrocephalus, extra-axial collection or mass lesion/mass effect. Mild chronic small vessel disease is stable in appearance. Vascular: No hyperdense vessel or unexpected calcification. Skull: Normal. Negative for fracture or focal lesion. Sinuses/Orbits: Small right mastoid effusion stable since previous study. Other: None. IMPRESSION: No acute intracranial abnormality. Stable mild chronic small vessel disease. Stable chronic right mastoid effusion. Electronically Signed   By: Myles Rosenthal [name]   On: 11/29/2016 15:10        Scheduled Meds: . allopurinol  100 mg Oral Daily  . aspirin EC  81 mg Oral Daily  . cefTRIAXone (ROCEPHIN)  IV  1 g Intravenous Q24H  . enoxaparin (LOVENOX) injection  40 mg Subcutaneous Q24H  . folic acid  1 mg Oral Daily  . furosemide  20 mg Oral Daily  . hydrALAZINE  25 mg Oral QID  . insulin aspart  0-15 Units Subcutaneous TID WC  . labetalol  100 mg Oral BID  .  lisinopril  30 mg Oral Daily  . sodium chloride flush  3 mL Intravenous Q12H  . vancomycin  1,250 mg Intravenous Q24H   Continuous Infusions:   LOS: 0 days    Time spent: 35 minuts.     Alba Cory, MD [facility] Pager [phone]  If 7PM-7AM, please contact night-coverage [email or web address] Password [facility] 11/30/2016, 3:53 PM

## 2016-11-30 NOTE — Progress Notes (Signed)
Patient ID: [name] [name], female   DOB: 1930-01-08, 83 y.o.   MRN: [id number] Pt presented to [place] dept today for paracentesis. On limited US abd in all four quadrants there is no significant ascites present. Procedure was cancelled. Pt informed.

## 2016-12-01 ENCOUNTER — Encounter: Payer: Self-pay | Admitting: Rheumatology

## 2016-12-01 DIAGNOSIS — I10 Essential (primary) hypertension: Secondary | ICD-10-CM

## 2016-12-01 LAB — GLUCOSE, CAPILLARY: Glucose-Capillary: 110 mg/dL — ABNORMAL HIGH (ref 65–99)

## 2016-12-01 MED ORDER — LEVOFLOXACIN 500 MG PO TABS: 500.0000 mg | ORAL_TABLET | Freq: Every day | ORAL | 0 refills | Status: DC

## 2016-12-01 NOTE — Care Management Note (Signed)
Case Management Note  Patient Details  Name: [name] [name] MRN: [id number] Date of Birth: 09/13/1930  Subjective/Objective:     Admitted with Bronchopneumonia               Action/Plan: TCT [name] [name] (Daughter) for disposition plans. The family are collaborating on getting her into an ALF vs living in [place] with her family member; PCP is Dr Montez Morita; has private insurance with Medicare /Medicaid with prescription drug coverage;she has a personal care service that provides an aide for 3 hr during the morning and 1 hr in the afternoon; DME- walker, cane wheelchair; she also has [employer] medical alert system. HHC choice offered, [name] [name] requested [facility]. [name] [name] with [facility] called for arrangements.  Expected Discharge Date:  12/01/2016             Expected Discharge Plan:  Home w Home Health Services  Discharge planning Services  CM Consult  Choice offered to:  Adult Children  HH Arranged:  RN, PT HH Agency:  [facility]  Status of Service:  In process, will continue to follow  [name] [name] [phone] 12/01/2016, 10:43 AM

## 2016-12-01 NOTE — Evaluation (Signed)
Physical Therapy Evaluation Patient Details Name: [name] [name] MRN: [id number] DOB: 1930-03-09 Today's Date: 12/01/2016   History of Present Illness  [name] [name] is a 83 y.o. female  past medical history significant for gout, rheumatoid arthritis, muscle weakness, heart attack, seizures, stroke, coronary artery disease, heart block who presented to the emergency room for concerns of swollen face. Patient states she went to see her primary care doctor for evaluation of her swollen right face. Denies any trauma. Primary care physician advised her to go to emergency room for evaluation. After waiting in the lobby patient was seen at her face was not swollen. Patient has had a productive cough with sputum of unknown known color for the last "few" days. Admitted with dx Bronchopneumonia  Clinical Impression  Pt at baseline for mobility. Able to amb with steady gait with walker. Pt eager to return home.    Follow Up Recommendations No PT follow up    Equipment Recommendations  None recommended by PT    Recommendations for Other Services       Precautions / Restrictions Precautions Precautions: None Restrictions Weight Bearing Restrictions: No      Mobility  Bed Mobility Overal bed mobility:  (Pt sitting up in chair upon OT arrival)             General bed mobility comments: Pt up in chair  Transfers Overall transfer level: Modified independent Equipment used: Rolling walker (2 wheeled) Transfers: Sit to/from Stand Sit to Stand: Modified independent (Device/Increase time) Stand pivot transfers: Modified independent (Device/Increase time);Supervision       General transfer comment: Increased time and supervision for safety w/ RW  Ambulation/Gait Ambulation/Gait assistance: Modified independent (Device/Increase time) Ambulation Distance (Feet): 80 Feet Assistive device: Rolling walker (2 wheeled) Gait Pattern/deviations: Step-through pattern;Decreased stride  length Gait velocity: decr Gait velocity interpretation: Below normal speed for age/gender General Gait Details: Slow, steady gait with walker  Stairs            Wheelchair Mobility    Modified Rankin (Stroke Patients Only)       Balance Overall balance assessment: No apparent balance deficits (not formally assessed)                                           Pertinent Vitals/Pain Pain Assessment: No/denies pain    Home Living Family/patient expects to be discharged to:: Private residence Living Arrangements: Alone Available Help at Discharge: Personal care attendant (Personal care attendant 3+ hours a day in AM and PM) Type of Home: Independent living facility Home Access: Level entry     Home Layout: One level Home Equipment: Hand held shower head;Walker - 4 wheels;Walker - 2 wheels;Cane - single point;Tub bench Additional Comments: Has aid 7 days per week in AM and PM to assist with BADLs    Prior Function Level of Independence: Needs assistance   Gait / Transfers Assistance Needed: Modified independent with rollator  ADL's / Homemaking Assistance Needed: Aid assists with all bathing and dressing. She has a tub bench and her aid bathes her, she stands while she stes her "backside cleaned". Toileting independently. Does simplke meal prep in microwave etc.         Hand Dominance   Dominant Hand: Right    Extremity/Trunk Assessment   Upper Extremity Assessment Upper Extremity Assessment: Generalized weakness;RUE deficits/detail;LUE deficits/detail (Bilateral UE deficits 2* RA &  Gout. Significantly limited shoulder flexion bilaterally) RUE Deficits / Details: See comments above; PROM ~75% LUE Deficits / Details: See comments above; PROM ~70%    Lower Extremity Assessment Lower Extremity Assessment: Overall WFL for tasks assessed       Communication   Communication: No difficulties  Cognition Arousal/Alertness: Awake/alert Behavior  During Therapy: WFL for tasks assessed/performed Overall Cognitive Status: Within Functional Limits for tasks assessed                      General Comments      Exercises     Assessment/Plan    PT Assessment Patent does not need any further PT services  PT Problem List            PT Treatment Interventions      PT Goals (Current goals can be found in the Care Plan section)  Acute Rehab PT Goals Patient Stated Goal: Go home to ILF when medically able. Cont assist PRN from personal care taker and family PT Goal Formulation: All assessment and education complete, DC therapy    Frequency     Barriers to discharge        Co-evaluation               End of Session   Activity Tolerance: Patient tolerated treatment well Patient left: in chair;with call bell/phone within reach           Time: 1003-1014 PT Time Calculation (min) (ACUTE ONLY): 11 min   Charges:   PT Evaluation $PT Eval Low Complexity: 1 Procedure     PT G CodesAngelina Ok [facility] 12/02/2016, 10:53 AM  Skip Mayer PT [phone]

## 2016-12-01 NOTE — Evaluation (Signed)
Occupational Therapy Evaluation Patient Details Name: [name] [name] MRN: [id number] DOB: [birth year] Today's Date: 12/01/2016    History of Present Illness [name] [name] is a 83 y.o. female  past medical history significant for gout, rheumatoid arthritis, muscle weakness, heart attack, seizures, stroke, coronary artery disease, heart block who presented to the emergency room for concerns of swollen face. Patient states she went to see her primary care doctor for evaluation of her swollen right face. Denies any trauma. Primary care physician advised her to go to emergency room for evaluation. After waiting in the lobby patient was seen at her face was not swollen. Patient has had a productive cough with sputum of unknown known color for the last "few" days. Admitted with dx Bronchopneumonia   Clinical Impression   Pt was educated in role of OT and participated in ADL retraining session today for toileting, transfers, grooming standing at sink. She has personal care taker daily for 3 hrs in am and 1 hr in PM (7x/week) in morning and evening to assist with ADL's, dressing and bathing Min A level noted secondary to RA, Gout etc. Pt reports that she is at her baseline level of care and denies furhter need for acute OT in this setting. She plans to d/c home with care taker and PRN family assist at ILF when medically able. Will sign off OT.    Follow Up Recommendations  No OT follow up    Equipment Recommendations  None recommended by OT (Pt has DME)    Recommendations for Other Services       Precautions / Restrictions Precautions Precautions: Fall;Other (comment) (Seizure precautions; RA, Gout) Restrictions Weight Bearing Restrictions: No      Mobility Bed Mobility Overal bed mobility:  (Pt sitting up in chair upon OT arrival)                Transfers Overall transfer level: Needs assistance Equipment used: Rolling walker (2 wheeled) Transfers: Sit to/from [employer] Sit to Stand: Modified independent (Device/Increase time);Supervision Stand pivot transfers: Modified independent (Device/Increase time);Supervision       General transfer comment: Increased time and supervision for safety w/ RW    Balance Overall balance assessment: No apparent balance deficits (not formally assessed)                                          ADL Overall ADL's : Needs assistance/impaired;At baseline     Grooming: Wash/dry hands;Wash/dry face;Standing;Supervision/safety   Upper Body Bathing: Set up;Sitting;Minimal assistance   Lower Body Bathing: Set up;Minimal assistance;Sit to/from stand   Upper Body Dressing : Set up;Sitting;Minimal assistance Upper Body Dressing Details (indicate cue type and reason): Pt has personal care taker daily (7x/week) in morning and evening to assist with ADL's, dressing and bathing Min A level noted secondary to RA, Gout etc. Lower Body Dressing: Set up;Minimal assistance;Sit to/from stand   Toilet Transfer: Modified Independent;Supervision/safety;Ambulation;RW Toilet Transfer Details (indicate cue type and reason): Increased time, no hands on assitance Toileting- Clothing Manipulation and Hygiene: Modified independent;Sit to/from stand Toileting - Clothing Manipulation Details (indicate cue type and reason): Increased time, no hands on assistance   Tub/Shower Transfer Details (indicate cue type and reason): Simulated Min A (uses tub bench at home and personal caretaker assists) Functional mobility during ADLs: Modified independent;Supervision/safety;Rolling walker (Increased time) General ADL Comments: Pt was educated in role of  OT and participated in ADL retraining session today for toileting, transfers, grooming standing at sink. She has personal care taker daily for 3 hrs in am and 1 hr in PM (7x/week) in morning and evening to assist with ADL's, dressing and bathing Min A level noted secondary to  RA, Gout etc. Pt reports that she is at her baseline level of care and denies furhter need for acute OT in this setting. She plans to d/c home with care taker and PRN family assist at ILF when medically able.     Vision  Wears glasses at all times. No change from baseline level.   Perception     Praxis      Pertinent Vitals/Pain Pain Assessment: No/denies pain     Hand Dominance Right   Extremity/Trunk Assessment Upper Extremity Assessment Upper Extremity Assessment: Generalized weakness;RUE deficits/detail;LUE deficits/detail (Bilateral UE deficits 2* RA & Gout. Significantly limited shoulder flexion bilaterally) RUE Deficits / Details: See comments above; PROM ~75% LUE Deficits / Details: See comments above; PROM ~70%   Lower Extremity Assessment Lower Extremity Assessment: Defer to PT evaluation       Communication Communication Communication: No difficulties   Cognition Arousal/Alertness: Awake/alert Behavior During Therapy: WFL for tasks assessed/performed Overall Cognitive Status: Within Functional Limits for tasks assessed                     General Comments       Exercises       Shoulder Instructions      Home Living Family/patient expects to be discharged to:: Private residence Living Arrangements: Alone Available Help at Discharge: Personal care attendant (Personal care attendant 3+ hours a day in AM and PM) Type of Home: Independent living facility Home Access: Level entry     Home Layout: One level     Bathroom Shower/Tub: Tub/shower unit;Curtain Shower/tub characteristics: Engineer, building services: Handicapped height     Home Equipment: Stage manager - 4 wheels;Walker - 2 wheels;Cane - single point;Tub bench   Additional Comments: Has aid 7 days per week in AM and PM to assist with BADLs      Prior Functioning/Environment Level of Independence: Needs assistance  Gait / Transfers Assistance Needed: RW/cane ADL's /  Homemaking Assistance Needed: Aid assists with all bathing and dressing. She has a tub bench and her aid bathes her, she stands while she stes her "backside cleaned". Toileting independently. Does simplke meal prep in microwave etc.             OT Problem List:     OT Treatment/Interventions:      OT Goals(Current goals can be found in the care plan section) Acute Rehab OT Goals Patient Stated Goal: Go home to ILF when medically able. Cont assist PRN from personal care taker and family OT Goal Formulation: All assessment and education complete, DC therapy  OT Frequency:     Barriers to D/C:            Co-evaluation              End of Session Equipment Utilized During Treatment: Engineer, water Communication: Mobility status;Other (comment) (Mod I/supervision - min guard assist for ADL's. At baseline level )  Activity Tolerance: Patient tolerated treatment well Patient left: in chair;with call bell/phone within reach;with nursing/sitter in room   Time: 5427-0623 OT Time Calculation (min): 27 min Charges:  OT General Charges $OT Visit: 1 Procedure OT Evaluation $OT Eval Low Complexity: 1 Procedure  OT Treatments $Self Care/Home Management : 8-22 mins G-Codes: OT G-codes **NOT FOR INPATIENT CLASS** Functional Assessment Tool Used: ADL's/self care; clinial judgement Functional Limitation: Self care Self Care Current Status (A4166): At least 1 percent but less than 20 percent impaired, limited or restricted Self Care Goal Status (A6301): At least 1 percent but less than 20 percent impaired, limited or restricted Self Care Discharge Status [id number]): At least 1 percent but less than 20 percent impaired, limited or restricted  Alm Bustard, OTR/L 12/01/2016, 9:39 AM

## 2016-12-01 NOTE — Discharge Summary (Signed)
Physician Discharge Summary  ALOHA [name] VHQ:[id number] DOB: 1930-04-09 DOA: 11/29/2016  PCP: [name] Boys, DO  Admit date: 11/29/2016 Discharge date: 12/01/2016  Admitted From: Home  Disposition:  Home   Recommendations for Outpatient Follow-up:  1. Follow up with PCP in 1-2 weeks 2. Please obtain BMP/CBC in one week     Discharge Condition: Stable.  CODE STATUS: Full code.  Diet recommendation: Heart Healthy  Brief/Interim Summary: [name] [name] a 82 y.o.femalepast medical history significant for gout, rheumatoid arthritis, muscle weakness, heart attack, seizures, stroke, coronary artery disease, heart block who presented to the emergency room for concerns of swollen face. Patient states she went to see her primary care doctor for evaluation of her swollen right face. Denies any trauma. Primary care physician advised her to go to emergency room for evaluation. After waiting in the lobby patient was seen at her face was not swollen. Patient has had a productive cough with sputum of unknown known color for the last "few" days. Patient denies any fever, chills, nausea, vomiting, rash, headache, blurry vision sore throat, runny nose. Patient is refusing any sick contacts.  ED course: Chest x-ray showed bronchopneumonia. Hospital was consulted for admission.    Assessment & Plan:  Broncho-Pnaeumonia.  Treated with  vancomycin and zosyn IV for 2 days. Plan to discharge on Levaquin for 5 days.   Right face swelling, facial drop ?? ;  Mild tenderness right side neck.  Decline MRI.  No focal deficit notice.   Chronic mastoid effusion;  Continue with antibiotics.  Needs follow up with ENT>   Hypertension Continue labetalol, lisinopril, Lasix  Gout  continue allopurinol  CAD/TIA Cont Aspirin  DM SSI ACHS  Discharge Diagnoses:  Active Problems:   HTN (hypertension)   DM (diabetes mellitus) (HCC)   Bronchopneumonia    Discharge  Instructions  Discharge Instructions    Diet - low sodium heart healthy    Complete by:  As directed    Increase activity slowly    Complete by:  As directed      Allergies as of 12/01/2016      Reactions   Clonidine Derivatives Swelling   [name].     Fish Allergy Swelling   Shellfish Allergy Hives   Indomethacin Other (See Comments)   unknown   Lyrica [pregabalin]    hallucinations   Methyldopa Other (See Comments)   unknown   Cetirizine Hcl Itching, Rash   unknown   Codeine Itching   unknown   Tomato Rash      Medication List    STOP taking these medications   methotrexate 2.5 MG tablet Commonly known as:  RHEUMATREX     TAKE these medications   albuterol 108 (90 Base) MCG/ACT inhaler Commonly known as:  PROVENTIL HFA;VENTOLIN HFA Inhale 1 puff into the lungs every 6 (six) hours as needed for wheezing or shortness of breath.   allopurinol 100 MG tablet Commonly known as:  ZYLOPRIM Take one tablet by mouth once daily for gout   aspirin EC 81 MG tablet Take 81 mg by mouth daily.   benzonatate 100 MG capsule Commonly known as:  TESSALON take 2 capsules by mouth three times a day if needed for cough   calcium-vitamin D 500-200 MG-UNIT tablet Commonly known as:  OSCAL WITH D Take 1 tablet by mouth daily.   ferrous sulfate 325 (65 FE) MG tablet Take 325 mg by mouth daily.   folic acid  1 MG tablet Commonly known as:  FOLVITE Take 1 tablet (1 mg total) by mouth daily.   furosemide 20 MG tablet Commonly known as:  LASIX take 1 tablet by mouth once daily   glucose blood test strip Commonly known as:  ONE TOUCH ULTRA TEST Check blood sugar once daily DX 250.00   hydrALAZINE 25 MG tablet Commonly known as:  APRESOLINE take 1 tablet by mouth four times a day   labetalol 100 MG tablet Commonly known as:  NORMODYNE take 2 tablet by mouth twice a day   levofloxacin 500 MG  tablet Commonly known as:  LEVAQUIN Take 1 tablet (500 mg total) by mouth daily.   lisinopril 30 MG tablet Commonly known as:  PRINIVIL,ZESTRIL take 1 tablet by mouth once daily   metFORMIN 500 MG tablet Commonly known as:  GLUCOPHAGE take 1 tablet by mouth once daily for diabetes   ORENCIA CLICKJECT 125 MG/ML Soaj Generic drug:  Abatacept INJECT 1 PEN SUBCUTANEOUSLY WEEKLY   OVER THE COUNTER MEDICATION Take 1 tablet by mouth 2 (two) times daily. Wellness formula for immune support   oxycodone 5 MG capsule Commonly known as:  OXY-IR Take 1 capsule (5 mg total) by mouth every 4 (four) hours as needed for pain (Arthritis pain).   polyvinyl alcohol 1.4 % ophthalmic solution Commonly known as:  LIQUIFILM TEARS Place 1 drop into both eyes as needed (for dry eyes).   predniSONE 5 MG tablet Commonly known as:  DELTASONE 4po qAM x 4 days, 3po qAM x 4 days, 2po qAM x 4 days, 1po qAM x 4 days, 1/2po qAM x 4 days, then stop.   traMADol 50 MG tablet Commonly known as:  ULTRAM take 1-2 tablets by mouth every 6 hours for MODERATE pain What changed:  how much to take  how to take this  when to take this  reasons to take this  additional instructions   triamcinolone cream 0.1 % Commonly known as:  KENALOG apply to affected area twice a day if needed for IRRITATION   vitamin C 500 MG tablet Commonly known as:  ASCORBIC ACID Take 500 mg by mouth daily.   Vitamin D-3 1000 units Caps Take 1 capsule by mouth daily.      Follow-up Information    [facility] Follow up.   Why:  They will do your home health care at your home Contact information: [address] [place] [address] [phone]        [name] Boys, DO. Go on 12/11/2016.   Specialty:  Internal Medicine Why:  @8 :45am Contact information: [address] [place] [address] [phone]          Allergies  Allergen Reactions  . Clonidine Derivatives Swelling     [name].    . Fish Allergy Swelling  . Shellfish Allergy Hives  . Indomethacin Other (See Comments)    unknown  . Lyrica [Pregabalin]     hallucinations  . Methyldopa Other (See Comments)    unknown  . Cetirizine Hcl Itching and Rash    unknown  . Codeine Itching    unknown  . Tomato Rash    Consultations:  None   Procedures/Studies: Dg Chest 2 View  Result Date: 11/29/2016 CLINICAL DATA:  Cough and fever over the last week. EXAM: CHEST  2 VIEW COMPARISON:  09/10/2015 FINDINGS: Cardiac silhouette is markedly enlarged. There is unfolding of the aorta. There are  patchy infiltrates in both lungs consistent with bronchopneumonia. No consolidation or lobar collapse. No effusion. No acute bone finding. Extensive chronic and postoperative changes of the shoulders. IMPRESSION: Patchy bronchopneumonia bilaterally. Cardiomegaly and aortic atherosclerosis. Electronically Signed   By: Paulina Fusi [name]   On: 11/29/2016 13:47   Ct Head Wo Contrast  Result Date: 11/29/2016 CLINICAL DATA:  Acute onset dizziness today. Hypertension. Personal history of TIAs. EXAM: CT HEAD WITHOUT CONTRAST TECHNIQUE: Contiguous axial images were obtained from the base of the skull through the vertex without intravenous contrast. COMPARISON:  03/27/2015 FINDINGS: Brain: No evidence of acute infarction, hemorrhage, hydrocephalus, extra-axial collection or mass lesion/mass effect. Mild chronic small vessel disease is stable in appearance. Vascular: No hyperdense vessel or unexpected calcification. Skull: Normal. Negative for fracture or focal lesion. Sinuses/Orbits: Small right mastoid effusion stable since previous study. Other: None. IMPRESSION: No acute intracranial abnormality. Stable mild chronic small vessel disease. Stable chronic right mastoid effusion. Electronically Signed   By: Myles Rosenthal [name]   On: 11/29/2016 15:10   US Abdomen  Limited  Result Date: 11/30/2016 CLINICAL DATA:  Clinical concern for ascites. EXAM: LIMITED ABDOMEN ULTRASOUND FOR ASCITES TECHNIQUE: Limited ultrasound survey for ascites was performed in all four abdominal quadrants. COMPARISON:  Abdomen pelvis CT dated 09/11/2015. FINDINGS: Survey of the 4 quadrants of the abdomen demonstrates no free peritoneal fluid. The previously demonstrated right renal cyst has not changed significantly. IMPRESSION: No free peritoneal fluid.  Stable right renal cyst. Electronically Signed   By: Beckie Salts [name]   On: 11/30/2016 16:50       Subjective: Feeling well, denies dyspnea. No swelling face.   Discharge Exam: Vitals:   12/01/16 0532 12/01/16 0735  BP: (!) 148/55 137/66  Pulse: 94 89  Resp: 18 18  Temp: 98.4 F (36.9 C) 97.9 F (36.6 C)   Vitals:   11/30/16 1329 11/30/16 2214 12/01/16 0532 12/01/16 0735  BP:  (!) 157/54 (!) 148/55 137/66  Pulse:  89 94 89  Resp:  18 18 18   Temp:  98.6 F (37 C) 98.4 F (36.9 C) 97.9 F (36.6 C)  TempSrc:  Oral Oral Oral  SpO2: 96% 100% 100% 100%  Weight:   79.3 kg (174 lb 12.8 oz)   Height:        General: Pt is alert, awake, not in acute distress Cardiovascular: RRR, S1/S2 +, no rubs, no gallops Respiratory: CTA bilaterally, no wheezing, no rhonchi Abdominal: Soft, NT, ND, bowel sounds + Extremities: no edema, no cyanosis    The results of significant diagnostics from this hospitalization (including imaging, microbiology, ancillary and laboratory) are listed below for reference.     Microbiology: No results found for this or any previous visit (from the past 240 hour(s)).   Labs: BNP (last 3 results) No results for input(s): BNP in the last 8760 hours. Basic Metabolic Panel:  Recent Labs Lab 11/29/16 1223 11/30/16 0531  NA 138 139  K 4.7 3.7  CL 107 107  CO2 23 24  GLUCOSE 87 109*  BUN 24* 19  CREATININE 0.94 0.94  CALCIUM 9.0 8.8*   Liver Function Tests:  Recent Labs Lab  11/30/16 0531  AST 19  ALT 14  ALKPHOS 48  BILITOT 1.0  PROT 6.0*  ALBUMIN 3.0*   No results for input(s): LIPASE, AMYLASE in the last 168 hours. No results for input(s): AMMONIA in the last 168 hours. CBC:  Recent Labs Lab 11/29/16 1223  WBC 9.3  NEUTROABS 6.5  HGB 10.2*  HCT 31.4*  MCV 94.0  PLT 327   Cardiac Enzymes: No results for input(s): CKTOTAL, CKMB, CKMBINDEX, TROPONINI in the last 168 hours. BNP: Invalid input(s): POCBNP CBG:  Recent Labs Lab 11/30/16 0554 11/30/16 1150 11/30/16 1812 11/30/16 2138 12/01/16 0525  GLUCAP 109* 100* 144* 92 110*   D-Dimer No results for input(s): DDIMER in the last 72 hours. Hgb A1c No results for input(s): HGBA1C in the last 72 hours. Lipid Profile No results for input(s): CHOL, HDL, LDLCALC, TRIG, CHOLHDL, LDLDIRECT in the last 72 hours. Thyroid function studies No results for input(s): TSH, T4TOTAL, T3FREE, THYROIDAB in the last 72 hours.  Invalid input(s): FREET3 Anemia work up No results for input(s): VITAMINB12, FOLATE, FERRITIN, TIBC, IRON, RETICCTPCT in the last 72 hours. Urinalysis    Component Value Date/Time   COLORURINE STRAW (A) 11/29/2016 1327   APPEARANCEUR CLEAR 11/29/2016 1327   LABSPEC 1.009 11/29/2016 1327   PHURINE 6.0 11/29/2016 1327   GLUCOSEU NEGATIVE 11/29/2016 1327   HGBUR NEGATIVE 11/29/2016 1327   BILIRUBINUR NEGATIVE 11/29/2016 1327   BILIRUBINUR Neg 07/23/2015 1501   KETONESUR NEGATIVE 11/29/2016 1327   PROTEINUR NEGATIVE 11/29/2016 1327   UROBILINOGEN 1.0 09/11/2015 0135   NITRITE NEGATIVE 11/29/2016 1327   LEUKOCYTESUR TRACE (A) 11/29/2016 1327   Sepsis Labs Invalid input(s): PROCALCITONIN,  WBC,  LACTICIDVEN Microbiology No results found for this or any previous visit (from the past 240 hour(s)).   Time coordinating discharge: Over 30 minutes  SIGNED:   Alba Cory, MD  [facility] 12/01/2016, 10:54 AM Pager [phone]  If 7PM-7AM, please contact  night-coverage [email or web address] Password [email or web address]

## 2016-12-01 NOTE — Progress Notes (Signed)
Family at bedside with patient for discharge instructions. Patient and family received discharged instructions including follow up appointments, new prescriptions discussed, diet, home PT and when to follow up with primary. Patient and daughter verbalized understanding of all discharge instructions.

## 2016-12-01 NOTE — Progress Notes (Signed)
   Please review previous note for full details  Mrs. [name] [name], member ID number [id number], has been approved for [name] tablets from her soap or prescription.  Approval authorizes coverage from 09/01/2016 to 11/30/2019. This is under Medicare part D   We received this message yesterday and oriented dressed it with the patient. We note the patient is currently hospitalized at [facility] for pneumonia. I will spoken with the daughter regarding her hospitalization.  I saw her on Saturday at our clinic and discuss switching her to [name] since Dub Amis was not working  As soon as she is well enough from the pneumonia, she will get blood work done requested. Then she will be switched to [place].  I will send a message to Dr. Lilla Shook, pharmacist. We will confirm that were moving forward with switching patient to [facility] as planned. I suspect that everything is done but I will send a message anyway to confirm.

## 2016-12-02 ENCOUNTER — Telehealth: Payer: Self-pay

## 2016-12-02 NOTE — Telephone Encounter (Signed)
Transition Care Management Follow-Up Telephone Call   Date discharged and where:  [name]; 12/01/16  How have you been since you were released from the hospital? Spoke with pt's daughter who states she is doing better since being home. Pt has family and caregivers that are staying with her.    Any patient concerns? None  Items Reviewed:   Meds: Y  Allergies:Y  Dietary Changes Reviewed:Y  Functional Questionnaire:  Independent-I Dependent-D  ADLs:   Dressing- D    Eating- I   Maintaining continence- I   Transferring-D   Transportation-D   Meal Prep-D   Managing Meds- D  Confirmed importance and Date/Time of follow-up visits scheduled: 12/11/16 @ 8:45 am Dr. Montez Morita   Confirmed with patient if condition worsens to call PCP or go to the Emergency Dept. Patient was given office number and encouraged to call back with questions or concerns: Jeannie Fend

## 2016-12-03 ENCOUNTER — Ambulatory Visit (HOSPITAL_COMMUNITY)
Admission: EM | Admit: 2016-12-03 | Discharge: 2016-12-03 | Disposition: A | Discharge status: Home / Self Care | Payer: Medicare Other | Attending: Family Medicine | Admitting: Family Medicine

## 2016-12-03 ENCOUNTER — Telehealth: Payer: Self-pay

## 2016-12-03 ENCOUNTER — Encounter (HOSPITAL_COMMUNITY): Payer: Self-pay | Admitting: Family Medicine

## 2016-12-03 ENCOUNTER — Emergency Department (HOSPITAL_COMMUNITY)
Admission: EM | Admit: 2016-12-03 | Discharge: 2016-12-03 | Disposition: A | Discharge status: Home / Self Care | Payer: Medicare Other | Attending: Dermatology | Admitting: Dermatology

## 2016-12-03 DIAGNOSIS — Z8673 Personal history of transient ischemic attack (TIA), and cerebral infarction without residual deficits: Secondary | ICD-10-CM | POA: Insufficient documentation

## 2016-12-03 DIAGNOSIS — I10 Essential (primary) hypertension: Secondary | ICD-10-CM | POA: Diagnosis not present

## 2016-12-03 DIAGNOSIS — Z5321 Procedure and treatment not carried out due to patient leaving prior to being seen by health care provider: Secondary | ICD-10-CM | POA: Insufficient documentation

## 2016-12-03 DIAGNOSIS — Z96653 Presence of artificial knee joint, bilateral: Secondary | ICD-10-CM | POA: Insufficient documentation

## 2016-12-03 DIAGNOSIS — I252 Old myocardial infarction: Secondary | ICD-10-CM | POA: Insufficient documentation

## 2016-12-03 DIAGNOSIS — I2510 Atherosclerotic heart disease of native coronary artery without angina pectoris: Secondary | ICD-10-CM | POA: Diagnosis not present

## 2016-12-03 DIAGNOSIS — R21 Rash and other nonspecific skin eruption: Secondary | ICD-10-CM | POA: Insufficient documentation

## 2016-12-03 DIAGNOSIS — E119 Type 2 diabetes mellitus without complications: Secondary | ICD-10-CM | POA: Insufficient documentation

## 2016-12-03 DIAGNOSIS — M069 Rheumatoid arthritis, unspecified: Secondary | ICD-10-CM

## 2016-12-03 DIAGNOSIS — J180 Bronchopneumonia, unspecified organism: Secondary | ICD-10-CM | POA: Diagnosis not present

## 2016-12-03 MED ORDER — AZITHROMYCIN 500 MG PO TABS: 500.0000 mg | ORAL_TABLET | Freq: Every day | ORAL | 0 refills | Status: DC

## 2016-12-03 MED ORDER — PREDNISONE 20 MG PO TABS: 20.0000 mg | ORAL_TABLET | Freq: Every day | ORAL | 0 refills | Status: DC

## 2016-12-03 MED ORDER — KETOCONAZOLE 2 % EX CREA: 1.0000 "application " | TOPICAL_CREAM | Freq: Two times a day (BID) | CUTANEOUS | 0 refills | Status: DC

## 2016-12-03 NOTE — Telephone Encounter (Signed)
Noted - pt needs to stay in the ED to be seen

## 2016-12-03 NOTE — Telephone Encounter (Signed)
Noted  

## 2016-12-03 NOTE — Telephone Encounter (Signed)
[name] Elease Hashimoto called c/o allergic reaction to levaquin. Patient with inflammed itchy rash on different parts of body, areas are sensitive to touch. Patient developed symptoms on Tuesday right after starting antibiotic and continue to worsening.   Patient used Kenalog cream that she already has an active rx for.   Please advise

## 2016-12-03 NOTE — ED Triage Notes (Signed)
Pt reports a painful, itchy rash under both arms and her abdomen.  She was recently hospitalized for pneumonia and sent home on antibiotics.

## 2016-12-03 NOTE — Telephone Encounter (Signed)
Reviewed medical record. STOP levaquin. Rx Azithromycin 500mg  #5 take 1 tab po daily no RF. Complete prednisone taper. HOLD orencia and MTX while ill. Follow up as scheduled

## 2016-12-03 NOTE — Telephone Encounter (Signed)
Spoke with [name] [name]. Patient went to the ER, rash was spreading and patient with vomiting episodes   I called [facility] to cancel rx for azithromycin

## 2016-12-03 NOTE — ED Provider Notes (Signed)
[facility]    CSN: [id number] Arrival date & time: 12/03/16  1607     History   Chief Complaint Chief Complaint  Patient presents with  . Rash    HPI [name] [name] is a 83 y.o. female.   This is an 83 year old woman brought in by her daughter because of skin rash under the arms and on the lower abdomen. The pain from the rash is been getting worse over the last day or so.  Patient has been on methotrexate and prednisone but was told to stop these when she developed what was diagnosed as a pneumonia last week. She's not had a cough and over 36 hours now. She was also told to stop taking Levaquin when she developed this painful rash under her arms.  Currently patient is having significant arthritis pain as well as the skin irritation. Her last methotrexate was a week ago and she usually takes it every Thursday. Her prednisone taper ran out last week as well.      Past Medical History:  Diagnosis Date  . Allergic rhinitis due to pollen 04/27/2007  . Arthritis    "in q joint" (08/07/2013)  . Coronary atherosclerosis of native coronary artery   . Cough   . Disorder of bone and cartilage, unspecified   . Edema 05/03/2013  . Exertional shortness of breath    "sometimes" (08/07/2013)  . First degree atrioventricular block   . Gout, unspecified 04/19/2013  . Hyperlipidemia 04/27/2007  . Hypertension   . Muscle spasm   . Muscle weakness (generalized)   . Myocardial infarction ~ 1970  . Onychia and paronychia of toe   . Osteoarthrosis, unspecified whether generalized or localized, unspecified site 04/27/2007  . Other fall   . Other malaise and fatigue   . Prepatellar bursitis   . Seizures (HCC)   . Stroke (HCC) 01/17/2014  . TIA (transient ischemic attack)    "a few one summer" (08/07/2013)  . Type II diabetes mellitus (HCC)    "fasting 90-110s" (08/07/2013)  . Unspecified essential hypertension 08/08/2013  . Unspecified vitamin D deficiency     Patient  Active Problem List   Diagnosis Date Noted  . Bronchopneumonia 11/29/2016  . High risk medication use 11/23/2016  . High risk medications (not anticoagulants) long-term use 09/21/2016  . Hammer toe 10/31/2015  . Pain in lower limb 10/31/2015  . Acute on chronic diastolic heart failure (HCC) 09/12/2015  . Chest pain 09/11/2015  . Avulsion of left hamstring muscle 06/20/2015  . Avulsion of hamstring muscle 06/19/2015  . Onychomycosis 04/24/2015  . Type 2 diabetes mellitus with microalbuminuria or microproteinuria 11/27/2014  . Absolute anemia 07/03/2014  . Midline low back pain without sciatica 06/27/2014  . Bilateral edema of lower extremity 06/05/2014  . Chronic steroid use 06/05/2014  . CKD (chronic kidney disease) stage 3, GFR 30-59 ml/min 06/05/2014  . DM (diabetes mellitus) (HCC) 06/05/2014  . Hypertensive renal disease 06/05/2014  . Acute sinus infection 05/08/2014  . Tachycardia 05/01/2014  . Anemia of chronic disease 05/01/2014  . Fluctuating blood pressure 02/06/2014  . Dizziness 02/06/2014  . Stroke (HCC) 01/17/2014  . Syncope and collapse 01/17/2014  . Rheumatoid arthritis (HCC) 12/19/2013  . CAD (coronary artery disease) 10/18/2013  . Unspecified arthropathy, lower leg 09/13/2013  . Other specified disease of white blood cells 09/13/2013  . Cold intolerance 09/12/2013  . Anemia 09/12/2013  . Need for prophylactic vaccination and inoculation against influenza 09/12/2013  . Acute blood loss anemia  08/15/2013  . Acute post-hemorrhagic anemia 08/09/2013  . HTN (hypertension) 08/08/2013  . Hyponatremia 08/08/2013  . Preoperative clearance 07/11/2013  . Myalgia and myositis 05/30/2013  . Fibromyalgia 05/10/2013  . Acute gout 05/03/2013  . Acute bronchitis 05/03/2013  . Edema 05/03/2013  . Reactive airway disease 04/19/2013  . Gout 04/19/2013  . HYPERLIPIDEMIA 04/27/2007  . Essential hypertension 04/27/2007  . Allergic rhinitis 04/27/2007  . DIVERTICULOSIS, COLON  04/27/2007  . Osteoarthritis 04/27/2007    Past Surgical History:  Procedure Laterality Date  . ABDOMINAL HYSTERECTOMY  04/1980  . APPENDECTOMY  1960  . CARDIAC CATHETERIZATION  2003  . CATARACT EXTRACTION W/ INTRAOCULAR LENS  IMPLANT, BILATERAL Bilateral ~ 2012  . JOINT REPLACEMENT    . REPLACEMENT TOTAL KNEE Right 09/2005  . SHOULDER OPEN ROTATOR CUFF REPAIR Right 07/1999  . TONSILLECTOMY  08/1974  . TOTAL KNEE ARTHROPLASTY Left 08/07/2013  . TOTAL KNEE ARTHROPLASTY Left 08/07/2013   Procedure: TOTAL KNEE ARTHROPLASTY- LEFT;  Surgeon: Dannielle Huh, MD;  Location: [facility];  Service: Orthopedics;  Laterality: Left;  . TRANSTHORACIC ECHOCARDIOGRAM  2003   EF 55-65%; mild concentric LVH    OB History    No data available       Home Medications    Prior to Admission medications   Medication Sig Start Date End Date Taking? Authorizing Provider  albuterol (PROVENTIL HFA;VENTOLIN HFA) 108 (90 Base) MCG/ACT inhaler Inhale 1 puff into the lungs every 6 (six) hours as needed for wheezing or shortness of breath.   Yes Historical Provider, MD  allopurinol (ZYLOPRIM) 100 MG tablet Take one tablet by mouth once daily for gout 09/21/16  Yes Naitik Panwala, PA-C  aspirin EC 81 MG tablet Take 81 mg by mouth daily.   Yes Historical Provider, MD  benzonatate (TESSALON) 100 MG capsule take 2 capsules by mouth three times a day if needed for cough 07/14/16  Yes Kirt Boys, DO  calcium-vitamin D (OSCAL WITH D) 500-200 MG-UNIT per tablet Take 1 tablet by mouth daily.    Yes Historical Provider, MD  Cholecalciferol (VITAMIN D-3) 1000 UNITS CAPS Take 1 capsule by mouth daily.   Yes Historical Provider, MD  ferrous sulfate 325 (65 FE) MG tablet Take 325 mg by mouth daily.    Yes Historical Provider, MD  folic acid (FOLVITE) 1 MG tablet Take 1 tablet (1 mg total) by mouth daily. 09/21/16 12/15/17 Yes Naitik Panwala, PA-C  furosemide (LASIX) 20 MG tablet take 1 tablet by mouth once daily 09/28/16  Yes Kirt Boys, DO  glucose blood (ONE TOUCH ULTRA TEST) test strip Check blood sugar once daily DX 250.00 05/18/14  Yes Tiffany L Reed, DO  hydrALAZINE (APRESOLINE) 25 MG tablet take 1 tablet by mouth four times a day 11/16/16  Yes Kirt Boys, DO  labetalol (NORMODYNE) 100 MG tablet take 2 tablet by mouth twice a day 06/22/16  Yes Kirt Boys, DO  lisinopril (PRINIVIL,ZESTRIL) 30 MG tablet take 1 tablet by mouth once daily 10/14/16  Yes Kimber Relic, MD  metFORMIN (GLUCOPHAGE) 500 MG tablet take 1 tablet by mouth once daily for diabetes 05/28/16  Yes Tiffany L Reed, DO  ORENCIA CLICKJECT 125 MG/ML SOAJ INJECT 1 PEN SUBCUTANEOUSLY WEEKLY 11/04/16  Yes Pollyann Savoy, MD  OVER THE COUNTER MEDICATION Take 1 tablet by mouth 2 (two) times daily. Wellness formula for immune support   Yes Historical Provider, MD  oxycodone (OXY-IR) 5 MG capsule Take 1 capsule (5 mg total) by mouth every 4 (four)  hours as needed for pain (Arthritis pain). 09/02/16  Yes Kirt Boys, DO  polyvinyl alcohol (LIQUIFILM TEARS) 1.4 % ophthalmic solution Place 1 drop into both eyes as needed (for dry eyes).   Yes Historical Provider, MD  triamcinolone cream (KENALOG) 0.1 % apply to affected area twice a day if needed for IRRITATION 06/22/16  Yes Kimber Relic, MD  vitamin C (ASCORBIC ACID) 500 MG tablet Take 500 mg by mouth daily.   Yes Historical Provider, MD  ketoconazole (NIZORAL) 2 % cream Apply 1 application topically 2 (two) times daily. 12/03/16   Elvina Sidle, MD  predniSONE (DELTASONE) 20 MG tablet Take 1 tablet (20 mg total) by mouth daily with breakfast. Two daily with food 12/03/16   Elvina Sidle, MD    Family History Family History  Problem Relation Age of Onset  . Heart attack Father     Social History Social History  Substance Use Topics  . Smoking status: Never Smoker  . Smokeless tobacco: Never Used  . Alcohol use No     Allergies   Clonidine derivatives; Fish allergy; Shellfish allergy;  Indomethacin; Lyrica [pregabalin]; Methyldopa; Cetirizine hcl; Codeine; Levaquin [levofloxacin in d5w]; and Tomato   Review of Systems Review of Systems  Constitutional: Negative.   HENT: Negative.   Eyes: Negative.   Respiratory: Negative.   Cardiovascular: Negative.   Gastrointestinal: Negative.   Musculoskeletal: Positive for arthralgias and joint swelling.  Skin: Positive for rash.  Neurological: Negative.      Physical Exam Triage Vital Signs ED Triage Vitals  Enc Vitals Group     BP      Pulse      Resp      Temp      Temp src      SpO2      Weight      Height      Head Circumference      Peak Flow      Pain Score      Pain Loc      Pain Edu?      Excl. in GC?    No data found.   Updated Vital Signs BP 143/83 (BP Location: Left Wrist)   Temp 99.2 F (37.3 C) (Oral)   SpO2 97%    Physical Exam  Constitutional: She is oriented to person, place, and time. She appears well-developed and well-nourished.  HENT:  Head: Normocephalic.  Right Ear: External ear normal.  Left Ear: External ear normal.  Mouth/Throat: Oropharynx is clear and moist.  Eyes: Conjunctivae and EOM are normal. Pupils are equal, round, and reactive to light.  Neck: Normal range of motion. Neck supple.  Cardiovascular: Normal rate, regular rhythm and normal heart sounds.   Pulmonary/Chest: Effort normal and breath sounds normal.  Musculoskeletal: She exhibits tenderness and deformity.  Neurological: She is alert and oriented to person, place, and time.  Skin: Rash noted.  Patient has mild erythema in both axillae and also in the groin creases.  Nursing note and vitals reviewed.    UC Treatments / Results  Labs (all labs ordered are listed, but only abnormal results are displayed) Labs Reviewed - No data to display  EKG  EKG Interpretation None       Radiology No results found.  Procedures Procedures (including critical care time)  Medications Ordered in  UC Medications - No data to display   Initial Impression / Assessment and Plan / UC Course  I have reviewed the triage vital signs and  the nursing notes.  Pertinent labs & imaging results that were available during my care of the patient were reviewed by me and considered in my medical decision making (see chart for details).     Final Clinical Impressions(s) / UC Diagnoses   Final diagnoses:  Rash  Rheumatoid arthritis flare (HCC)    New Prescriptions New Prescriptions   KETOCONAZOLE (NIZORAL) 2 % CREAM    Apply 1 application topically 2 (two) times daily.   PREDNISONE (DELTASONE) 20 MG TABLET    Take 1 tablet (20 mg total) by mouth daily with breakfast. Two daily with food     Elvina Sidle, MD 12/03/16 1649

## 2016-12-03 NOTE — Telephone Encounter (Signed)
[name] other daughter called back (was with patient in the ER) and said they could not wait in the ER patient was in extreme pain, extreme weakness, rash spreading and vomiting and they would not take her back and questioned what to do.  Patient was very uncomfortable sitting in the waiting area in the ER and was told she could not stay in the wheelchair for it was needed for other patient's  I recommended that patient stay and be seen or go to Urgent Care, I stressed that patient needs medical attention today

## 2016-12-03 NOTE — Telephone Encounter (Signed)
No available appointments today, I called Pat to inform her that I am awaiting response from Kirt Boys, DO

## 2016-12-03 NOTE — ED Notes (Signed)
Pt left ED and arrived at an UC without telling staff

## 2016-12-03 NOTE — Discharge Instructions (Signed)
The rash appears to be related to taking antibiotics. The location and the appearance suggests that this is a fungal infection of the skin.  Finish the Levaquin

## 2016-12-03 NOTE — ED Triage Notes (Signed)
Pt reports painful, itching rash 2 days after started on an antibiotic. Denies chest pain, denies shortness of breath. Ate breakfast today, no difficulty swallowing

## 2016-12-04 ENCOUNTER — Other Ambulatory Visit: Payer: Self-pay

## 2016-12-04 NOTE — Patient Outreach (Signed)
[facility]) Care Management  12/04/2016  TANITH DAGOSTINO Dec 16, 1929 [id number]      EMMI-PNA RED ON EMMI ALERT Day #2 Date: 12/03/16 Red Alert Reason:"Feeling better overall? No"    Outreach attempt #1 to patient. Spoke with dtr-Patricia(ROI on file). Reviewed and discussed red alert. Dtr shares with RN CM that events of yesterday when patient was not feeling well. She states that patient developed a painful rash to her whole body after taking antibiotic. Dtr reports she called the unit that patient had been hospitalized on to advise what to do as well as she contacted MD office. She reports she was told to take patient to ER to be evaluated. She took patient to ER but ended up leaving ER and taking patient to Urgent Care due to extended wait time. She reports that she was seen and evaluated at urgent care and given "steroids and some cream" to help with rash. Dtr reports that rash is improved and patient feeling and doing much better at this time. Dtr states that she was not happy with the response she got from PCP office on yesterday and is wanting to find patient a new PCP. Provided contact info to dtr on how she can locate a new PCP. Advised dtr that patient will continue to receive automated EMMI-PNA post discharge f/u calls(total of 15 calls over 30 days). Advised dtr that if any responses to questions are abnormal then it will trigger a call from a nurse to check on patient. She voiced understanding and was appreciative of call.     Plan: RN CM will notify [facility] administrative assistant of case status.   Antionette Fairy, RN,BSN,CCM [facility] Care Management Telephonic Care Management Coordinator Direct Phone: [phone] Toll Free: [phone] Fax: [phone]

## 2016-12-07 ENCOUNTER — Other Ambulatory Visit: Payer: Self-pay

## 2016-12-07 NOTE — Telephone Encounter (Signed)
This encounter was created in error - please disregard.

## 2016-12-07 NOTE — Patient Outreach (Signed)
[facility]) Care Management  12/07/2016  [name] [name] [birth year] [id number]       EMMI- PNA RED ON EMMI ALERT Day # 5 Date: 12/06/16 Red Alert Reason: "More SOB than yesterday? Yes" "Had diarrhea or felt sick to stomach? Yes"    Outreach attempt #1 to patient. No answer at present. RN CM left HIPAA compliant voicemail message along with contact info.       Plan: RN CM will make outreach attempt to patient within one business day if no return call from patient.   Antionette Fairy, RN,BSN,CCM [facility] Care Management Telephonic Care Management Coordinator Direct Phone: [phone] Toll Free: [phone] Fax: [phone]

## 2016-12-07 NOTE — Patient Outreach (Addendum)
Patient triggered RED on EMMI Pneumonia Dashboard, notification sent to :  Antionette Fairy, RN

## 2016-12-08 ENCOUNTER — Other Ambulatory Visit: Payer: Self-pay

## 2016-12-08 ENCOUNTER — Ambulatory Visit: Payer: Medicare Other | Admitting: Family Medicine

## 2016-12-08 ENCOUNTER — Encounter: Payer: Self-pay | Admitting: Family Medicine

## 2016-12-08 ENCOUNTER — Ambulatory Visit (INDEPENDENT_AMBULATORY_CARE_PROVIDER_SITE_OTHER): Payer: Medicare Other | Admitting: Family Medicine

## 2016-12-08 VITALS — BP 180/90 | HR 96 | Temp 98.2°F | Ht 64.0 in | Wt 179.4 lb

## 2016-12-08 DIAGNOSIS — I5033 Acute on chronic diastolic (congestive) heart failure: Secondary | ICD-10-CM | POA: Diagnosis not present

## 2016-12-08 DIAGNOSIS — I998 Other disorder of circulatory system: Secondary | ICD-10-CM | POA: Diagnosis not present

## 2016-12-08 DIAGNOSIS — M2040 Other hammer toe(s) (acquired), unspecified foot: Secondary | ICD-10-CM

## 2016-12-08 DIAGNOSIS — J301 Allergic rhinitis due to pollen: Secondary | ICD-10-CM

## 2016-12-08 DIAGNOSIS — E785 Hyperlipidemia, unspecified: Secondary | ICD-10-CM

## 2016-12-08 DIAGNOSIS — D638 Anemia in other chronic diseases classified elsewhere: Secondary | ICD-10-CM | POA: Diagnosis not present

## 2016-12-08 DIAGNOSIS — Z6830 Body mass index (BMI) 30.0-30.9, adult: Secondary | ICD-10-CM

## 2016-12-08 DIAGNOSIS — M545 Low back pain, unspecified: Secondary | ICD-10-CM

## 2016-12-08 DIAGNOSIS — M06041 Rheumatoid arthritis without rheumatoid factor, right hand: Secondary | ICD-10-CM

## 2016-12-08 DIAGNOSIS — R600 Localized edema: Secondary | ICD-10-CM | POA: Diagnosis not present

## 2016-12-08 DIAGNOSIS — I639 Cerebral infarction, unspecified: Secondary | ICD-10-CM | POA: Diagnosis not present

## 2016-12-08 DIAGNOSIS — M06042 Rheumatoid arthritis without rheumatoid factor, left hand: Secondary | ICD-10-CM

## 2016-12-08 DIAGNOSIS — I2510 Atherosclerotic heart disease of native coronary artery without angina pectoris: Secondary | ICD-10-CM

## 2016-12-08 DIAGNOSIS — Z79899 Other long term (current) drug therapy: Secondary | ICD-10-CM

## 2016-12-08 DIAGNOSIS — M1A9XX0 Chronic gout, unspecified, without tophus (tophi): Secondary | ICD-10-CM

## 2016-12-08 DIAGNOSIS — B351 Tinea unguium: Secondary | ICD-10-CM

## 2016-12-08 DIAGNOSIS — R55 Syncope and collapse: Secondary | ICD-10-CM

## 2016-12-08 DIAGNOSIS — E669 Obesity, unspecified: Secondary | ICD-10-CM | POA: Insufficient documentation

## 2016-12-08 DIAGNOSIS — N183 Chronic kidney disease, stage 3 unspecified: Secondary | ICD-10-CM

## 2016-12-08 DIAGNOSIS — T8189XA Other complications of procedures, not elsewhere classified, initial encounter: Secondary | ICD-10-CM | POA: Diagnosis not present

## 2016-12-08 DIAGNOSIS — M797 Fibromyalgia: Secondary | ICD-10-CM

## 2016-12-08 DIAGNOSIS — E1122 Type 2 diabetes mellitus with diabetic chronic kidney disease: Secondary | ICD-10-CM

## 2016-12-08 DIAGNOSIS — K5790 Diverticulosis of intestine, part unspecified, without perforation or abscess without bleeding: Secondary | ICD-10-CM

## 2016-12-08 DIAGNOSIS — J4520 Mild intermittent asthma, uncomplicated: Secondary | ICD-10-CM

## 2016-12-08 DIAGNOSIS — I10 Essential (primary) hypertension: Secondary | ICD-10-CM | POA: Diagnosis not present

## 2016-12-08 MED ORDER — MUPIROCIN 2 % EX OINT: 1.0000 "application " | TOPICAL_OINTMENT | Freq: Two times a day (BID) | CUTANEOUS | 0 refills | Status: DC

## 2016-12-08 NOTE — Patient Outreach (Signed)
[facility]) Care Management  12/08/2016  [name] [name] [birth year] [id number]   EMMI- PNA RED ON EMMI ALERT Day # 5 Date: 12/06/16 Red Alert Reason: "More SOB than yesterday? Yes" "Had diarrhea or felt sick to stomach? Yes"    Voicemail message received from patient's dtr. Outreach attempt #2 to patient. Spoke with dtr(ROI on file). Addressed and reviewed red alerts. Dtr states that SOB response was an error. She denies patient experiencing any SOB or respiratory issues. She does states that patient had diarrhea for one day and attributes it possibly to something that she ate. Symptoms have since resolved and patient doing well. Dtr reports that she was able to find patient a new PCP. She will be seeing Dr. Helane Rima with [facility] as new PCP and goes for first visit today. No further RN CM needs or concerns. Dtr advised that patient will continue to get automated EMMI PNA post discharge calls and will receive call from nurse if responses are abnormal. Dtr verbalized understanding and was appreciative of f/u call.     Plan: RN CM will notify [facility] administrative assistant of case status.    [name] Fairy, RN,BSN,CCM [facility] Care Management Telephonic Care Management Coordinator Direct Phone: [phone] Toll Free: [phone] Fax: [phone]

## 2016-12-08 NOTE — Progress Notes (Signed)
Pre visit review using our clinic review tool, if applicable. No additional management support is needed unless otherwise documented below in the visit note. 

## 2016-12-08 NOTE — Progress Notes (Signed)
[name] [name] is a 83 y.o. female is here to [facility].   History of Present Illness:    Patient with Hx of rheumatoid arthritis, previously on MTX and prednisone, with recent bout of PNA. Treated with IV Vanc and Zosyn for 2 days and discharged home on Levaquin. Developed rash on lower abdomen, treated with Nizoral cream. The patient did not finish the last 2 pills of in the Abx bottle. However, she is feeling much better today. Denies Cp, SOB, cough. Her daughter is with her and states that the patient has much more color today as well.   ENT appointment on Friday. Recent CT showing chronic right mastoid effusion.  Rheumatoid arthritis, followed by Rheumatology. Currently on Orencia.   Patient would like for me to check a wound on her right mid-shin. Nonhealing, originally from biopsy. Nontender. No redness. Some drainage. No Hx of MRSA.   PMHx, SurgHx, SocialHx, Medications, and Allergies were reviewed in the Visit Navigator and updated as appropriate.    Past Medical History:  Diagnosis Date  . Allergic rhinitis due to pollen 04/27/2007  . Arthritis    "in q joint" (08/07/2013)  . Coronary atherosclerosis of native coronary artery   . Cough   . Disorder of bone and cartilage, unspecified   . Edema 05/03/2013  . Exertional shortness of breath    "sometimes" (08/07/2013)  . First degree atrioventricular block   . Gout, unspecified 04/19/2013  . Hyperlipidemia 04/27/2007  . Hypertension   . Muscle spasm   . Muscle weakness (generalized)   . Myocardial infarction ~ 1970  . Onychia and paronychia of toe   . Osteoarthrosis, unspecified whether generalized or localized, unspecified site 04/27/2007  . Other fall   . Other malaise and fatigue   . Prepatellar bursitis   . Seizures (HCC)   . Stroke (HCC) 01/17/2014  . TIA (transient ischemic attack)    "a few one summer" (08/07/2013)  . Type II diabetes mellitus (HCC)    "fasting 90-110s" (08/07/2013)  . Unspecified  essential hypertension 08/08/2013  . Unspecified vitamin D deficiency     Past Surgical History:  Procedure Laterality Date  . ABDOMINAL HYSTERECTOMY  04/1980  . APPENDECTOMY  1960  . CARDIAC CATHETERIZATION  2003  . CATARACT EXTRACTION W/ INTRAOCULAR LENS  IMPLANT, BILATERAL Bilateral ~ 2012  . JOINT REPLACEMENT    . REPLACEMENT TOTAL KNEE Right 09/2005  . SHOULDER OPEN ROTATOR CUFF REPAIR Right 07/1999  . TONSILLECTOMY  08/1974  . TOTAL KNEE ARTHROPLASTY Left 08/07/2013  . TOTAL KNEE ARTHROPLASTY Left 08/07/2013   Procedure: TOTAL KNEE ARTHROPLASTY- LEFT;  Surgeon: Dannielle Huh, MD;  Location: [facility] OR;  Service: Orthopedics;  Laterality: Left;  . TRANSTHORACIC ECHOCARDIOGRAM  2003   EF 55-65%; mild concentric LVH    Family History  Problem Relation Age of Onset  . Heart attack Father     Social History  Substance Use Topics  . Smoking status: Never Smoker  . Smokeless tobacco: Never Used  . Alcohol use No     Current Medications and Allergies:    Current Outpatient Prescriptions:  .  albuterol (PROVENTIL HFA;VENTOLIN HFA) 108 (90 Base) MCG/ACT inhaler, Inhale 1 puff into the lungs every 6 (six) hours as needed for wheezing or shortness of breath., Disp: , Rfl:  .  allopurinol (ZYLOPRIM) 100 MG tablet, Take one tablet by mouth once daily for gout, Disp: 30 tablet, Rfl: 1 .  aspirin EC 81 MG tablet, Take 81 mg by mouth  daily., Disp: , Rfl:  .  benzonatate (TESSALON) 100 MG capsule, take 2 capsules by mouth three times a day if needed for cough, Disp: 30 capsule, Rfl: 0 .  calcium-vitamin D (OSCAL WITH D) 500-200 MG-UNIT per tablet, Take 1 tablet by mouth daily. , Disp: , Rfl:  .  Cholecalciferol (VITAMIN D-3) 1000 UNITS CAPS, Take 1 capsule by mouth daily., Disp: , Rfl:  .  ferrous sulfate 325 (65 FE) MG tablet, Take 325 mg by mouth daily. , Disp: , Rfl:  .  folic acid (FOLVITE) 1 MG tablet, Take 1 tablet (1 mg total) by mouth daily., Disp: 90 tablet, Rfl: 4 .  furosemide  (LASIX) 20 MG tablet, take 1 tablet by mouth once daily, Disp: 30 tablet, Rfl: 3 .  glucose blood (ONE TOUCH ULTRA TEST) test strip, Check blood sugar once daily DX 250.00, Disp: 100 each, Rfl: 3 .  hydrALAZINE (APRESOLINE) 25 MG tablet, take 1 tablet by mouth four times a day, Disp: 120 tablet, Rfl: 6 .  ketoconazole (NIZORAL) 2 % cream, Apply 1 application topically 2 (two) times daily., Disp: 30 g, Rfl: 0 .  labetalol (NORMODYNE) 100 MG tablet, take 2 tablet by mouth twice a day, Disp: 120 tablet, Rfl: 5 .  lisinopril (PRINIVIL,ZESTRIL) 30 MG tablet, take 1 tablet by mouth once daily, Disp: 30 tablet, Rfl: 5 .  metFORMIN (GLUCOPHAGE) 500 MG tablet, take 1 tablet by mouth once daily for diabetes, Disp: 90 tablet, Rfl: 1 .  ORENCIA CLICKJECT 125 MG/ML SOAJ, INJECT 1 PEN SUBCUTANEOUSLY WEEKLY, Disp: 4 mL, Rfl: 2 .  OVER THE COUNTER MEDICATION, Take 1 tablet by mouth 2 (two) times daily. Wellness formula for immune support, Disp: , Rfl:  .  polyvinyl alcohol (LIQUIFILM TEARS) 1.4 % ophthalmic solution, Place 1 drop into both eyes as needed (for dry eyes)., Disp: , Rfl:  .  triamcinolone cream (KENALOG) 0.1 %, apply to affected area twice a day if needed for IRRITATION, Disp: 45 g, Rfl: 2 .  vitamin C (ASCORBIC ACID) 500 MG tablet, Take 500 mg by mouth daily., Disp: , Rfl:  .  oxycodone (OXY-IR) 5 MG capsule, Take 1 capsule (5 mg total) by mouth every 4 (four) hours as needed for pain (Arthritis pain). (Patient not taking: Reported on 12/08/2016), Disp: 60 capsule, Rfl: 0   Allergies  Allergen Reactions  . Clonidine Derivatives Swelling    [name].    . Fish Allergy Swelling  . Shellfish Allergy Hives  . Indomethacin Other (See Comments)    unknown  . Lyrica [Pregabalin]     hallucinations  . Methyldopa Other (See Comments)    unknown  . Cetirizine Hcl Itching and Rash    unknown  . Codeine Itching    unknown  .  Levaquin [Levofloxacin In D5w] Rash  . Tomato Rash      Patient Information Form: Screening and ROS    Do you feel safe in relationships? yes PHQ-2: negative  Review of Systems  General:  Negative for nexplained weight loss, fever Skin: Negative for new or changing mole, sore that won't heal HEENT: Negative for trouble hearing, trouble seeing, ringing in ears, mouth sores, hoarseness, change in voice, dysphagia CV:  Negative for chest pain, dyspnea, edema, palpitations Resp: Negative for cough, dyspnea, hemoptysis GI: Negative for nausea, vomiting, diarrhea, constipation, abdominal pain, melena, hematochezia GU: Negative for dysuria, incontinence, urinary hesitance, hematuria, vaginal or penile discharge, polyuria, sexual difficulty,  lumps in testicle or breasts MSK: Negative for muscle cramps or aches, joint pain or swelling Neuro: Negative for headaches, weakness, numbness, dizziness, passing out/fainting Psych: Negative for depression, anxiety   Vitals:   Vitals:   12/08/16 1314 12/08/16 1355  BP: (!) 186/92 (!) 180/90  Pulse: 96   Temp: 98.2 F (36.8 C)   TempSrc: Oral   SpO2: 95%   Weight: 179 lb 6.4 oz (81.4 kg)   Height: 5\' 4"  (1.626 m)      Body mass index is 30.79 kg/m.   Physical Exam:    General: Alert, cooperative, appears stated age and no distress.  HEENT:  Normocephalic, without obvious abnormality, atraumatic. Conjunctivae/corneas clear. PERRL, EOM's intact. Normal TM's and external ear canals both ears. Nares normal. Septum midline. Mucosa normal. No drainage or sinus tenderness. Lips, mucosa, and tongue normal; teeth and gums normal.  Lungs: Clear to auscultation bilaterally.  Heart:: Regular rate and rhythm, S1, S2 normal, no murmur, click, rub or gallop.  Abdomen: Soft, non-tender; bowel sounds normal; no masses,  no organomegaly.  Extremities: Extremities normal, atraumatic, no cyanosis or edema.  Pulses: 2+ and symmetric.  Skin: Skin color,  texture, turgor normal. Right shin with dime sized wound on shin ith minimal yellow drainage, without surrounding redness, heat, fluctuance, ot streaking  Neurologic: Alert and oriented X 3, normal strength and tone. Normal symmetric. reflexes. Normal coordination and gait.  Psych: Alert,oriented, in NAD with a full range of affect, normal behavior and no psychotic features      Assessment and Plan:    Stanisha was seen today for establish care.  Diagnoses and all orders for this visit:  Non-healing surgical wound, initial encounter -     mupirocin ointment (BACTROBAN) 2 %; Place 1 application into the nose 2 (two) times daily. - Wound care reviewed. Red flags discussed.   Essential hypertension       - Patient with Hx of labile blood pressures. Asymptomatic. She will check her BP more regularly and call if systolic continues > 150 over the next few days.   BMI 30.0-30.9,adult  Acute on chronic diastolic heart failure (HCC)  Coronary artery disease involving native coronary artery of native heart without angina pectoris  Cerebrovascular accident (CVA), unspecified mechanism (HCC)  Syncope and collapse  Fluctuating blood pressure  Anemia of chronic disease  Bilateral edema of lower extremity  Midline low back pain without sciatica, unspecified chronicity  Onychomycosis  Hammer toe, unspecified laterality  High risk medications (not anticoagulants) long-term use  Hyperlipidemia, unspecified hyperlipidemia type  Chronic seasonal allergic rhinitis due to pollen  Diverticulosis of intestine without bleeding, unspecified intestinal tract location  Mild intermittent reactive airway disease without complication  Chronic gout without tophus, unspecified cause, unspecified site  Fibromyalgia  Rheumatoid arthritis involving both hands with negative rheumatoid factor (HCC)  CKD (chronic kidney disease) stage 3, GFR 30-59 ml/min  Type 2 diabetes mellitus with stage 3  chronic kidney disease, without long-term current use of insulin (HCC)   . Reviewed expectations re: course of current medical issues. . Discussed self-management of symptoms. . Outlined signs and symptoms indicating need for more acute intervention. . Patient verbalized understanding and all questions were answered. . See orders for this visit as documented in the electronic medical record. . Patient received an After Visit Summary.  Records requested if needed. I spent 60 minutes with this patient, greater than 50% was face-to-face time counseling regarding the above diagnoses.    05-09-1995, D.O. [place],  [place] 12/08/2016   Follow-up: 3-6 months  Meds ordered this encounter  Medications  . mupirocin ointment (BACTROBAN) 2 %    Sig: Place 1 application into the nose 2 (two) times daily.    Dispense:  22 g    Refill:  0   Medications Discontinued During This Encounter  Medication Reason  . predniSONE (DELTASONE) 20 MG tablet Error   No orders of the defined types were placed in this encounter.

## 2016-12-10 ENCOUNTER — Other Ambulatory Visit: Payer: Self-pay | Admitting: *Deleted

## 2016-12-10 ENCOUNTER — Telehealth: Payer: Self-pay | Admitting: Rheumatology

## 2016-12-10 ENCOUNTER — Telehealth: Payer: Self-pay | Admitting: Pharmacist

## 2016-12-10 DIAGNOSIS — Z79899 Other long term (current) drug therapy: Secondary | ICD-10-CM

## 2016-12-10 DIAGNOSIS — E7800 Pure hypercholesterolemia, unspecified: Secondary | ICD-10-CM

## 2016-12-10 LAB — CBC WITH DIFFERENTIAL/PLATELET
Basophils Absolute: 0 cells/uL (ref 0–200)
Basophils Relative: 0 %
Eosinophils Absolute: 336 cells/uL (ref 15–500)
Eosinophils Relative: 4 %
HCT: 34.5 % — ABNORMAL LOW (ref 35.0–45.0)
Hemoglobin: 11.2 g/dL — ABNORMAL LOW (ref 11.7–15.5)
Lymphocytes Relative: 36 %
Lymphs Abs: 3024 cells/uL (ref 850–3900)
MCH: 31 pg (ref 27.0–33.0)
MCHC: 32.5 g/dL (ref 32.0–36.0)
MCV: 95.6 fL (ref 80.0–100.0)
MPV: 8.7 fL (ref 7.5–12.5)
Monocytes Absolute: 756 cells/uL (ref 200–950)
Monocytes Relative: 9 %
Neutro Abs: 4284 cells/uL (ref 1500–7800)
Neutrophils Relative %: 51 %
Platelets: 364 10*3/uL (ref 140–400)
RBC: 3.61 MIL/uL — ABNORMAL LOW (ref 3.80–5.10)
RDW: 18.9 % — ABNORMAL HIGH (ref 11.0–15.0)
WBC: 8.4 10*3/uL (ref 3.8–10.8)

## 2016-12-10 NOTE — Telephone Encounter (Signed)
Pharmacy Note  Subjective: Patient presents today to the [facility] to have her labs drawn.  Patient seen by the pharmacist for counseling on Xeljanz.    Objective: TB test: negative (07/03/16) Hepatitis: negative (12/12/13) Lipid panel: drawn today  CMP     Component Value Date/Time   NA 139 11/30/2016 0531   NA 134 (A) 02/18/2016   K 3.7 11/30/2016 0531   CL 107 11/30/2016 0531   CO2 24 11/30/2016 0531   GLUCOSE 109 (H) 11/30/2016 0531   BUN 19 11/30/2016 0531   BUN 31 (A) 02/18/2016   CREATININE 0.94 11/30/2016 0531   CREATININE 1.01 (H) 09/21/2016 1529   CALCIUM 8.8 (L) 11/30/2016 0531   PROT 6.0 (L) 11/30/2016 0531   PROT 7.2 09/09/2015 1210   ALBUMIN 3.0 (L) 11/30/2016 0531   ALBUMIN 3.9 09/09/2015 1210   AST 19 11/30/2016 0531   ALT 14 11/30/2016 0531   ALKPHOS 48 11/30/2016 0531   BILITOT 1.0 11/30/2016 0531   BILITOT 0.5 09/09/2015 1210   GFRNONAA 54 (L) 11/30/2016 0531   GFRNONAA 51 (L) 09/21/2016 1529   GFRAA >60 11/30/2016 0531   GFRAA 59 (L) 09/21/2016 1529    CBC    Component Value Date/Time   WBC 9.3 11/29/2016 1223   RBC 3.34 (L) 11/29/2016 1223   HGB 10.2 (L) 11/29/2016 1223   HCT 31.4 (L) 11/29/2016 1223   HCT 28.0 (L) 09/09/2015 1210   PLT 327 11/29/2016 1223   PLT 340 09/09/2015 1210   MCV 94.0 11/29/2016 1223   MCV 86 09/09/2015 1210   MCH 30.5 11/29/2016 1223   MCHC 32.5 11/29/2016 1223   RDW 20.1 (H) 11/29/2016 1223   RDW 16.8 (H) 09/09/2015 1210   LYMPHSABS 2.0 11/29/2016 1223   LYMPHSABS 1.9 09/09/2015 1210   MONOABS 0.5 11/29/2016 1223   EOSABS 0.2 11/29/2016 1223   EOSABS 0.5 (H) 09/09/2015 1210   BASOSABS 0.0 11/29/2016 1223   BASOSABS 0.0 09/09/2015 1210    Assessment/Plan: Counseled patient that [name] is a JAK inhibitor indicated for Rheumatoid Arthritis.  Counseled patient on purpose, proper use, and adverse effects of Xeljanz.  Reviewed the most common adverse effects including infection, diarrhea,  headaches.  Also reviewed rare adverse effects such as bowel injury and the need to contact us if she develops stomach pain during treatment.  Reviewed with patient that there is the possibility of an increased risk of malignancy but it is not well understood if this increased risk is due to the medication or the disease state.  Patient has been provided with medication education material.  Patient has consented to [place] use.  Will plan on initiation of [name] once labs return.     Lilla Shook, Pharm.D., BCPS, CPP Clinical Pharmacist Pager: [phone] Phone: [phone] 12/10/2016 9:09 AM

## 2016-12-10 NOTE — Telephone Encounter (Signed)
[name] [name] w/[facility] [phone] was told by a family member Dr. Algis Downs told patient to stop using Orencia. Please call him back and let him know if this is correct.

## 2016-12-10 NOTE — Telephone Encounter (Signed)
Contacted [facility] and advised patient is discontinuing Orenicia.

## 2016-12-11 ENCOUNTER — Telehealth: Payer: Self-pay | Admitting: Pharmacist

## 2016-12-11 ENCOUNTER — Encounter: Payer: Self-pay | Admitting: Internal Medicine

## 2016-12-11 ENCOUNTER — Encounter: Payer: Medicare Other | Admitting: Internal Medicine

## 2016-12-11 LAB — LIPID PANEL
Cholesterol: 176 mg/dL (ref <200)
HDL: 71 mg/dL (ref >50)
LDL Cholesterol: 86 mg/dL (ref <100)
Total CHOL/HDL Ratio: 2.5 Ratio (ref <5.0)
Triglycerides: 97 mg/dL (ref <150)
VLDL: 19 mg/dL (ref <30)

## 2016-12-11 LAB — COMPREHENSIVE METABOLIC PANEL
ALT: 11 U/L (ref 6–29)
AST: 13 U/L (ref 10–35)
Albumin: 3.5 g/dL — ABNORMAL LOW (ref 3.6–5.1)
Alkaline Phosphatase: 51 U/L (ref 33–130)
BUN: 25 mg/dL (ref 7–25)
CO2: 25 mmol/L (ref 20–31)
Calcium: 9.4 mg/dL (ref 8.6–10.4)
Chloride: 106 mmol/L (ref 98–110)
Creat: 1.13 mg/dL — ABNORMAL HIGH (ref 0.60–0.88)
Glucose, Bld: 97 mg/dL (ref 65–99)
Potassium: 4 mmol/L (ref 3.5–5.3)
Sodium: 141 mmol/L (ref 135–146)
Total Bilirubin: 0.5 mg/dL (ref 0.2–1.2)
Total Protein: 6.3 g/dL (ref 6.1–8.1)

## 2016-12-11 MED ORDER — PREDNISONE 5 MG PO TABS: ORAL_TABLET | ORAL | 0 refills | Status: DC

## 2016-12-11 MED ORDER — TOFACITINIB CITRATE 5 MG PO TABS: 5.0000 mg | ORAL_TABLET | Freq: Every day | ORAL | 2 refills | Status: DC

## 2016-12-11 MED FILL — XELJANZ 5 MG TABS: 5 | 30 days supply | Qty: 30 | Fill #0

## 2016-12-11 NOTE — Telephone Encounter (Signed)
Yes, that is appropriate.  OK to call in the following: OK to Prescribe  Prednisone 5mg :  4po qAM x 5 days,  3po qAM x 5 days, 2po qAM x 5 days, 1po qAM x 5 days, 1/2po qAM x 5 days, then stop. disp: 53 tablets w/ no refills.  Take until all gone and exactly as Rx'd. If diabetic, watch sugar levels and have pcp make adjustments for better control if needed.  ---

## 2016-12-11 NOTE — Addendum Note (Signed)
Addended by: Henriette Combs on: 12/11/2016 02:19 PM   Modules accepted: Orders

## 2016-12-11 NOTE — Telephone Encounter (Signed)
Spoke to patient's daughter and advised her patient can start Xeljanz 5 mg daily.  Sue Lush, can you send in this prescription?      Mr. Leane Call, patient's daughter reports patient restarted methotrexate last night.  Patient is having a lot of joint pain/swelling.  She asked if patient could have any prednisone while waiting for [name] and methotrexate to kick in?

## 2016-12-11 NOTE — Telephone Encounter (Signed)
Okay to start xeljanz 5mg  daily.  Will need cbc w/ diff & cmp w/ gfr in 1 month from starting xeljanz ( we will do in office at f-u appt on 01/13/2017).

## 2016-12-11 NOTE — Telephone Encounter (Signed)
Patient had labs yesterday.  Noted lipid panel was normal.  CMP showed Cr 1.13.  CBC showed Hgb 11.2, Hct 34.5.    Okay to proceed with plan to initiate Xeljanz 5 mg daily?  Patient reported yesterday she has been off Orencia since before her hospitalization on 11/29/16.

## 2016-12-28 ENCOUNTER — Telehealth: Payer: Self-pay | Admitting: Family Medicine

## 2016-12-31 ENCOUNTER — Ambulatory Visit: Payer: Medicare Other | Admitting: Rheumatology

## 2017-01-04 ENCOUNTER — Telehealth: Payer: Self-pay

## 2017-01-04 NOTE — Telephone Encounter (Signed)
Noted patient's last refill of [name] was on 12/11/16 at [facility]. I called patient with a refill reminder call and spoke with her daughter. She  confirms she does need a refill at this time.  Will have the outpatient pharmacy process patient's prescription at this time.   [name], [place], CPhT 9:04 AM

## 2017-01-07 MED FILL — XELJANZ 5 MG TABS: 5 | 30 days supply | Qty: 30 | Fill #1

## 2017-01-08 ENCOUNTER — Telehealth: Payer: Self-pay | Admitting: Family Medicine

## 2017-01-08 NOTE — Telephone Encounter (Signed)
[name] [name] is calling to get verbals on PT  2 week 2 then 1 week 1 starting 03.05.2018

## 2017-01-08 NOTE — Telephone Encounter (Signed)
Spoke with [place].  Per Dr. Earlene Plater, verbal order given for PT.

## 2017-01-10 ENCOUNTER — Ambulatory Visit (INDEPENDENT_AMBULATORY_CARE_PROVIDER_SITE_OTHER): Payer: Medicare Other

## 2017-01-10 ENCOUNTER — Ambulatory Visit (HOSPITAL_COMMUNITY)
Admission: EM | Admit: 2017-01-10 | Discharge: 2017-01-10 | Disposition: A | Discharge status: Home / Self Care | Payer: Medicare Other | Attending: Family Medicine | Admitting: Family Medicine

## 2017-01-10 ENCOUNTER — Encounter (HOSPITAL_COMMUNITY): Payer: Self-pay | Admitting: Emergency Medicine

## 2017-01-10 DIAGNOSIS — R531 Weakness: Secondary | ICD-10-CM | POA: Diagnosis not present

## 2017-01-10 DIAGNOSIS — I517 Cardiomegaly: Secondary | ICD-10-CM

## 2017-01-10 LAB — POCT I-STAT, CHEM 8
BUN: 25 mg/dL — ABNORMAL HIGH (ref 6–20)
Calcium, Ion: 1.27 mmol/L (ref 1.15–1.40)
Chloride: 104 mmol/L (ref 101–111)
Creatinine, Ser: 1.1 mg/dL — ABNORMAL HIGH (ref 0.44–1.00)
Glucose, Bld: 83 mg/dL (ref 65–99)
HCT: 34 % — ABNORMAL LOW (ref 36.0–46.0)
Hemoglobin: 11.6 g/dL — ABNORMAL LOW (ref 12.0–15.0)
Potassium: 4 mmol/L (ref 3.5–5.1)
Sodium: 139 mmol/L (ref 135–145)
TCO2: 27 mmol/L (ref 0–100)

## 2017-01-10 NOTE — Discharge Instructions (Signed)
Soup, crackers, and rest for the next couple days. If something changes dramatically for the worse, please return or call to the emergency department. Also, please follow-up with your primary care doctor in the middle part of the week.

## 2017-01-10 NOTE — ED Triage Notes (Signed)
Patient presents to [facility] with daughter, she has a complaint of weakness, she states that she no cough, but daughter reports that she had some wheezing throughout the night. Patient recently was diagnosed with Pneumonia but was unable to finish her antibiotics because they made her so sick.   A & O- x 4

## 2017-01-10 NOTE — ED Provider Notes (Signed)
[facility]    CSN: [id number] Arrival date & time: 01/10/17  1215     History   Chief Complaint Chief Complaint  Patient presents with  . Weakness    HPI [name] [name] is a 83 y.o. female.   Patient presents to [facility] with daughter, she has a complaint of weakness, she states that she no cough, but daughter reports that she had some wheezing throughout the night. Patient recently was diagnosed with Pneumonia but was unable to finish her antibiotics because they made her so sick.   Patient denies chest pain or abdominal pain. She's also had no diarrhea. Her main complaint is generalized weakness without any focal area of discomfort.      Past Medical History:  Diagnosis Date  . Allergic rhinitis due to pollen 04/27/2007  . Arthritis    "in q joint" (08/07/2013)  . Coronary atherosclerosis of native coronary artery   . Cough   . Disorder of bone and cartilage, unspecified   . Edema 05/03/2013  . Exertional shortness of breath    "sometimes" (08/07/2013)  . First degree atrioventricular block   . Gout, unspecified 04/19/2013  . Hyperlipidemia 04/27/2007  . Hypertension   . Muscle spasm   . Muscle weakness (generalized)   . Myocardial infarction ~ 1970  . Onychia and paronychia of toe   . Osteoarthrosis, unspecified whether generalized or localized, unspecified site 04/27/2007  . Other fall   . Other malaise and fatigue   . Prepatellar bursitis   . Seizures (HCC)   . Stroke (HCC) 01/17/2014  . TIA (transient ischemic attack)    "a few one summer" (08/07/2013)  . Type II diabetes mellitus (HCC)    "fasting 90-110s" (08/07/2013)  . Unspecified essential hypertension 08/08/2013  . Unspecified vitamin D deficiency     Patient Active Problem List   Diagnosis Date Noted  . BMI 30.0-30.9,adult 12/08/2016  . High risk medications (not anticoagulants) long-term use 09/21/2016  . Hammer toe 10/31/2015  . Acute on chronic diastolic heart failure (HCC)  33/35/4562  . Onychomycosis 04/24/2015  . Midline low back pain without sciatica 06/27/2014  . Bilateral edema of lower extremity 06/05/2014  . CKD (chronic kidney disease) stage 3, GFR 30-59 ml/min 06/05/2014  . DM (diabetes mellitus), type 2 with renal complications (HCC) 06/05/2014    Class: Chronic  . Anemia of chronic disease 05/01/2014  . Fluctuating blood pressure 02/06/2014  . Stroke (HCC) 01/17/2014  . Rheumatoid arthritis (HCC) 12/19/2013    Class: Chronic  . CAD (coronary artery disease) 10/18/2013  . HTN (hypertension) 08/08/2013    Class: Chronic  . Fibromyalgia 05/10/2013  . Reactive airway disease 04/19/2013  . Gout 04/19/2013  . Hyperlipidemia 04/27/2007  . Allergic rhinitis 04/27/2007  . Diverticulosis 04/27/2007    Past Surgical History:  Procedure Laterality Date  . ABDOMINAL HYSTERECTOMY  04/1980  . APPENDECTOMY  1960  . CARDIAC CATHETERIZATION  2003  . CATARACT EXTRACTION W/ INTRAOCULAR LENS  IMPLANT, BILATERAL Bilateral ~ 2012  . JOINT REPLACEMENT    . REPLACEMENT TOTAL KNEE Right 09/2005  . SHOULDER OPEN ROTATOR CUFF REPAIR Right 07/1999  . TONSILLECTOMY  08/1974  . TOTAL KNEE ARTHROPLASTY Left 08/07/2013  . TOTAL KNEE ARTHROPLASTY Left 08/07/2013   Procedure: TOTAL KNEE ARTHROPLASTY- LEFT;  Surgeon: Dannielle Huh, MD;  Location: [facility];  Service: Orthopedics;  Laterality: Left;  . TRANSTHORACIC ECHOCARDIOGRAM  2003   EF 55-65%; mild concentric LVH    OB History  No data available       Home Medications    Prior to Admission medications   Medication Sig Start Date End Date Taking? Authorizing Provider  albuterol (PROVENTIL HFA;VENTOLIN HFA) 108 (90 Base) MCG/ACT inhaler Inhale 1 puff into the lungs every 6 (six) hours as needed for wheezing or shortness of breath.    Historical Provider, MD  allopurinol (ZYLOPRIM) 100 MG tablet Take one tablet by mouth once daily for gout 09/21/16   Naitik Panwala, PA-C  aspirin EC 81 MG tablet Take 81 mg by mouth  daily.    Historical Provider, MD  benzonatate (TESSALON) 100 MG capsule take 2 capsules by mouth three times a day if needed for cough 07/14/16   Kirt Boys, DO  calcium-vitamin D (OSCAL WITH D) 500-200 MG-UNIT per tablet Take 1 tablet by mouth daily.     Historical Provider, MD  Cholecalciferol (VITAMIN D-3) 1000 UNITS CAPS Take 1 capsule by mouth daily.    Historical Provider, MD  ferrous sulfate 325 (65 FE) MG tablet Take 325 mg by mouth daily.     Historical Provider, MD  folic acid (FOLVITE) 1 MG tablet Take 1 tablet (1 mg total) by mouth daily. 09/21/16 12/15/17  Naitik Panwala, PA-C  furosemide (LASIX) 20 MG tablet take 1 tablet by mouth once daily 09/28/16   Kirt Boys, DO  glucose blood (ONE TOUCH ULTRA TEST) test strip Check blood sugar once daily DX 250.00 05/18/14   Tiffany L Reed, DO  hydrALAZINE (APRESOLINE) 25 MG tablet take 1 tablet by mouth four times a day 11/16/16   Kirt Boys, DO  ketoconazole (NIZORAL) 2 % cream Apply 1 application topically 2 (two) times daily. 12/03/16   Elvina Sidle, MD  labetalol (NORMODYNE) 100 MG tablet take 2 tablet by mouth twice a day 06/22/16   Kirt Boys, DO  lisinopril (PRINIVIL,ZESTRIL) 30 MG tablet take 1 tablet by mouth once daily 10/14/16   Kimber Relic, MD  metFORMIN (GLUCOPHAGE) 500 MG tablet take 1 tablet by mouth once daily for diabetes 05/28/16   Kermit Balo, DO  mupirocin ointment (BACTROBAN) 2 % Place 1 application into the nose 2 (two) times daily. 12/08/16   Helane Rima, DO  ORENCIA CLICKJECT 125 MG/ML SOAJ INJECT 1 PEN SUBCUTANEOUSLY WEEKLY 11/04/16   Pollyann Savoy, MD  OVER THE COUNTER MEDICATION Take 1 tablet by mouth 2 (two) times daily. Wellness formula for immune support    Historical Provider, MD  polyvinyl alcohol (LIQUIFILM TEARS) 1.4 % ophthalmic solution Place 1 drop into both eyes as needed (for dry eyes).    Historical Provider, MD  predniSONE (DELTASONE) 5 MG tablet 4po qAM x 5 days, 3po qAM x 5 days, 2po qAM  x 5 days, 1po qAM x 5 days, 1/2po qAM x 5 days, then stop. 12/11/16   Naitik Panwala, PA-C  Tofacitinib Citrate (XELJANZ) 5 MG TABS Take 5 mg by mouth daily. 12/11/16   Naitik Panwala, PA-C  triamcinolone cream (KENALOG) 0.1 % apply to affected area twice a day if needed for IRRITATION 06/22/16   Kimber Relic, MD  vitamin C (ASCORBIC ACID) 500 MG tablet Take 500 mg by mouth daily.    Historical Provider, MD    Family History Family History  Problem Relation Age of Onset  . Heart attack Father     Social History Social History  Substance Use Topics  . Smoking status: Never Smoker  . Smokeless tobacco: Never Used  . Alcohol use No  Allergies   Clonidine derivatives; Fish allergy; Shellfish allergy; Indomethacin; Lyrica [pregabalin]; Methyldopa; Cetirizine hcl; Codeine; Levaquin [levofloxacin in d5w]; and Tomato   Review of Systems Review of Systems  Constitutional: Positive for activity change.  HENT: Negative.   Respiratory: Negative.   Cardiovascular: Negative.   Gastrointestinal: Positive for nausea and vomiting.  Genitourinary: Negative.   Musculoskeletal: Negative.   Neurological: Positive for weakness. Negative for dizziness, facial asymmetry and headaches.     Physical Exam Triage Vital Signs ED Triage Vitals  Enc Vitals Group     BP 01/10/17 1339 171/77     Pulse Rate 01/10/17 1339 86     Resp 01/10/17 1339 16     Temp 01/10/17 1339 98.2 F (36.8 C)     Temp Source 01/10/17 1339 Oral     SpO2 01/10/17 1339 97 %     Weight --      Height --      Head Circumference --      Peak Flow --      Pain Score 01/10/17 1338 0     Pain Loc --      Pain Edu? --      Excl. in GC? --    No data found.   Updated Vital Signs BP 171/77 (BP Location: Right Arm)   Pulse 86   Temp 98.2 F (36.8 C) (Oral)   Resp 16   SpO2 97%    Physical Exam  Constitutional: She is oriented to person, place, and time. She appears well-developed and well-nourished.  HENT:    Head: Normocephalic.  Right Ear: External ear normal.  Left Ear: External ear normal.  Mouth/Throat: Oropharynx is clear and moist.  Eyes: Conjunctivae and EOM are normal. Pupils are equal, round, and reactive to light.  Neck: Normal range of motion. Neck supple.  Cardiovascular: Normal rate.   Irregular beat about every fifth beat  Pulmonary/Chest: Effort normal and breath sounds normal.  Abdominal: Soft. Bowel sounds are normal. There is no tenderness.  Musculoskeletal: Normal range of motion.  Neurological: She is alert and oriented to person, place, and time.  Skin: Skin is warm and dry.  Nursing note and vitals reviewed.    UC Treatments / Results  Labs (all labs ordered are listed, but only abnormal results are displayed) Labs Reviewed  POCT I-STAT, CHEM 8 - Abnormal; Notable for the following:       Result Value   BUN 25 (*)    Creatinine, Ser 1.10 (*)    Hemoglobin 11.6 (*)    HCT 34.0 (*)    All other components within normal limits    EKG No significant ST or T-wave changes. There is one PVC. Patient has a slightly prolonged QT interval PR interval is 170 ms, QRS duration is 94 ms and QT interval is 406 ms   Radiology Marked cardiomegaly with blunting of CP angles. Procedures Procedures (including critical care time)  Medications Ordered in UC Medications - No data to display   Initial Impression / Assessment and Plan / UC Course  I have reviewed the triage vital signs and the nursing notes.  Pertinent labs & imaging results that were available during my care of the patient were reviewed by me and considered in my medical decision making (see chart for details).      Final Clinical Impressions(s) / UC Diagnoses   Final diagnoses:  Generalized weakness  Cardiomegaly  I believe the nausea and vomiting is what precipitated much of the  weakness. I don't see any alarming acute changes on either her labs or EKG or chest x-ray warranting further  investigation at this point.  New Prescriptions Current Discharge Medication List    Patient go home and rest along with soup and gradual increase in diet. She is to follow-up with her primary care doctor in the middle part of the week.   Elvina Sidle, MD 01/10/17 [phone]

## 2017-01-11 ENCOUNTER — Other Ambulatory Visit: Payer: Self-pay | Admitting: Internal Medicine

## 2017-01-12 ENCOUNTER — Encounter (HOSPITAL_COMMUNITY): Payer: Self-pay | Admitting: Emergency Medicine

## 2017-01-12 ENCOUNTER — Emergency Department (HOSPITAL_COMMUNITY)
Admission: EM | Admit: 2017-01-12 | Discharge: 2017-01-12 | Disposition: A | Discharge status: Home / Self Care | Payer: Medicare Other | Attending: Emergency Medicine | Admitting: Emergency Medicine

## 2017-01-12 DIAGNOSIS — Z7982 Long term (current) use of aspirin: Secondary | ICD-10-CM | POA: Diagnosis not present

## 2017-01-12 DIAGNOSIS — E119 Type 2 diabetes mellitus without complications: Secondary | ICD-10-CM | POA: Insufficient documentation

## 2017-01-12 DIAGNOSIS — Z96653 Presence of artificial knee joint, bilateral: Secondary | ICD-10-CM | POA: Diagnosis not present

## 2017-01-12 DIAGNOSIS — R531 Weakness: Secondary | ICD-10-CM

## 2017-01-12 DIAGNOSIS — Z8673 Personal history of transient ischemic attack (TIA), and cerebral infarction without residual deficits: Secondary | ICD-10-CM | POA: Diagnosis not present

## 2017-01-12 DIAGNOSIS — I10 Essential (primary) hypertension: Secondary | ICD-10-CM | POA: Diagnosis not present

## 2017-01-12 DIAGNOSIS — I2510 Atherosclerotic heart disease of native coronary artery without angina pectoris: Secondary | ICD-10-CM | POA: Diagnosis not present

## 2017-01-12 DIAGNOSIS — Z79899 Other long term (current) drug therapy: Secondary | ICD-10-CM | POA: Insufficient documentation

## 2017-01-12 DIAGNOSIS — Z7984 Long term (current) use of oral hypoglycemic drugs: Secondary | ICD-10-CM | POA: Diagnosis not present

## 2017-01-12 DIAGNOSIS — I252 Old myocardial infarction: Secondary | ICD-10-CM | POA: Insufficient documentation

## 2017-01-12 LAB — URINALYSIS, ROUTINE W REFLEX MICROSCOPIC
Bacteria, UA: NONE SEEN
Bilirubin Urine: NEGATIVE
Glucose, UA: NEGATIVE mg/dL
Hgb urine dipstick: NEGATIVE
Ketones, ur: NEGATIVE mg/dL
Nitrite: NEGATIVE
Protein, ur: NEGATIVE mg/dL
Specific Gravity, Urine: 1.011 (ref 1.005–1.030)
pH: 6 (ref 5.0–8.0)

## 2017-01-12 LAB — CBC
HCT: 32.1 % — ABNORMAL LOW (ref 36.0–46.0)
Hemoglobin: 10.6 g/dL — ABNORMAL LOW (ref 12.0–15.0)
MCH: 31.4 pg (ref 26.0–34.0)
MCHC: 33 g/dL (ref 30.0–36.0)
MCV: 95 fL (ref 78.0–100.0)
Platelets: 258 10*3/uL (ref 150–400)
RBC: 3.38 MIL/uL — ABNORMAL LOW (ref 3.87–5.11)
RDW: 17.3 % — ABNORMAL HIGH (ref 11.5–15.5)
WBC: 5.8 10*3/uL (ref 4.0–10.5)

## 2017-01-12 LAB — BASIC METABOLIC PANEL
Anion gap: 8 (ref 5–15)
BUN: 20 mg/dL (ref 6–20)
CO2: 24 mmol/L (ref 22–32)
Calcium: 9.2 mg/dL (ref 8.9–10.3)
Chloride: 106 mmol/L (ref 101–111)
Creatinine, Ser: 0.99 mg/dL (ref 0.44–1.00)
GFR calc Af Amer: 59 mL/min — ABNORMAL LOW (ref >60)
GFR calc non Af Amer: 51 mL/min — ABNORMAL LOW (ref >60)
Glucose, Bld: 128 mg/dL — ABNORMAL HIGH (ref 65–99)
Potassium: 3.8 mmol/L (ref 3.5–5.1)
Sodium: 138 mmol/L (ref 135–145)

## 2017-01-12 LAB — I-STAT CG4 LACTIC ACID, ED: Lactic Acid, Venous: 1.89 mmol/L (ref 0.5–1.9)

## 2017-01-12 MED ORDER — SODIUM CHLORIDE 0.9 % IV BOLUS (SEPSIS): 1000.0000 mL | Freq: Once | INTRAVENOUS | Status: DC

## 2017-01-12 NOTE — Discharge Instructions (Signed)
Please read attached information. If you experience any new or worsening signs or symptoms please return to the emergency room for evaluation. Please follow-up with your primary care provider or specialist as discussed.  °

## 2017-01-12 NOTE — ED Provider Notes (Signed)
Medical screening examination/treatment/procedure(s) were conducted as a shared visit with non-physician practitioner(s) and myself.  I personally evaluated the patient during the encounter.   EKG Interpretation  Date/Time:  Tuesday January 12 2017 11:48:04 EST Ventricular Rate:  85 PR Interval:  182 QRS Duration: 94 QT Interval:  408 QTC Calculation: 485 R Axis:   -26 Text Interpretation:  Normal sinus rhythm Left ventricular hypertrophy Inferior infarct , age undetermined Abnormal ECG similar to prior EKG  Confirmed by Tonianne Fine MD, Dena Esperanza [phone]) on 01/12/2017 3:15:54 PM      83 year old female who presents with generalized weakness. She has history of hypertension, CAD, DM, TIA. Onset of generalized weakness 5 days ago. Associated with one episode of nausea and vomiting 4 days ago, that is now resolved. No fevers, chills, chest pain, difficulty breathing, cough, abdominal pain, melena or hematochezia, dysuria or urinary frequency, lower extremity edema, dyspnea on exertion, orthopnea or PND. He was seen at urgent care 3 days ago had unremarkable workup. As yet to follow up with her PCP. Gradually feeling slightly improved but still having some generalized weakness and family felt that he may require repeat evaluation today.  She is well-appearing in no acute distress with stable vital signs. Exam overall nonfocal. Chest x-ray from 3 days ago visualized, and not showing infiltrate/pneumonia. She had cardiomegaly with mild vascular congestion. UA unremarkable for infection. Blood work reassuring without major electrolyte or metabolic derangements. No CHF symptoms or symptoms concerning for ACS. I do not feel that patient this time requires admission for ongoing workup. She will follow-up with her PCP for close follow-up and reevaluation.   Lavera Guise, MD 01/12/17 [phone]

## 2017-01-12 NOTE — ED Provider Notes (Signed)
[facility]   CSN: [id number] Arrival date & time: 01/12/17  1136     History   Chief Complaint Chief Complaint  Patient presents with  . Weakness    HPI [name] [name] is a 83 y.o. female.  HPI   83 year old female presents today with complaints of weakness.  Patient notes this has been ongoing, was seen 2 days ago for complaints of generalized weakness.  She had a workup which showed no significant findings.  Patient notes that she was feeling fine today, but then developed dizziness.  She notes this felt as if she was going to pass out, this was not associated with ambulation or going from sitting to standing.  She notes the sensation went away on its own.  She denies any actual spinning of the room, vision changes, chest pain, palpitations, abdominal pain, or any other neurological complaints.  At the time of evaluation patient reports she feels back to her baseline.  She reports that she did not want to come but family encouraged her to be evaluated here.  She denies any recent illnesses, had a slight headache with the episode which resolved on its own.  She denies any urinary complaints, reports no blood or dark stools.  She reports she is eating and drinking appropriately.     Past Medical History:  Diagnosis Date  . Allergic rhinitis due to pollen 04/27/2007  . Arthritis    "in q joint" (08/07/2013)  . Coronary atherosclerosis of native coronary artery   . Cough   . Disorder of bone and cartilage, unspecified   . Edema 05/03/2013  . Exertional shortness of breath    "sometimes" (08/07/2013)  . First degree atrioventricular block   . Gout, unspecified 04/19/2013  . Hyperlipidemia 04/27/2007  . Hypertension   . Muscle spasm   . Muscle weakness (generalized)   . Myocardial infarction ~ 1970  . Onychia and paronychia of toe   . Osteoarthrosis, unspecified whether generalized or localized, unspecified site 04/27/2007  . Other fall   . Other  malaise and fatigue   . Prepatellar bursitis   . Seizures (HCC)   . Stroke (HCC) 01/17/2014  . TIA (transient ischemic attack)    "a few one summer" (08/07/2013)  . Type II diabetes mellitus (HCC)    "fasting 90-110s" (08/07/2013)  . Unspecified essential hypertension 08/08/2013  . Unspecified vitamin D deficiency     Patient Active Problem List   Diagnosis Date Noted  . BMI 30.0-30.9,adult 12/08/2016  . High risk medications (not anticoagulants) long-term use 09/21/2016  . Hammer toe 10/31/2015  . Acute on chronic diastolic heart failure (HCC) 09/12/2015  . Onychomycosis 04/24/2015  . Midline low back pain without sciatica 06/27/2014  . Bilateral edema of lower extremity 06/05/2014  . CKD (chronic kidney disease) stage 3, GFR 30-59 ml/min 06/05/2014  . DM (diabetes mellitus), type 2 with renal complications (HCC) 06/05/2014    Class: Chronic  . Anemia of chronic disease 05/01/2014  . Fluctuating blood pressure 02/06/2014  . Stroke (HCC) 01/17/2014  . Rheumatoid arthritis (HCC) 12/19/2013    Class: Chronic  . CAD (coronary artery disease) 10/18/2013  . HTN (hypertension) 08/08/2013    Class: Chronic  . Fibromyalgia 05/10/2013  . Reactive airway disease 04/19/2013  . Gout 04/19/2013  . Hyperlipidemia 04/27/2007  . Allergic rhinitis 04/27/2007  . Diverticulosis 04/27/2007    Past Surgical History:  Procedure Laterality Date  . ABDOMINAL HYSTERECTOMY  04/1980  . APPENDECTOMY  1960  .  CARDIAC CATHETERIZATION  2003  . CATARACT EXTRACTION W/ INTRAOCULAR LENS  IMPLANT, BILATERAL Bilateral ~ 2012  . JOINT REPLACEMENT    . REPLACEMENT TOTAL KNEE Right 09/2005  . SHOULDER OPEN ROTATOR CUFF REPAIR Right 07/1999  . TONSILLECTOMY  08/1974  . TOTAL KNEE ARTHROPLASTY Left 08/07/2013  . TOTAL KNEE ARTHROPLASTY Left 08/07/2013   Procedure: TOTAL KNEE ARTHROPLASTY- LEFT;  Surgeon: Dannielle Huh, MD;  Location: [facility];  Service: Orthopedics;  Laterality: Left;  . TRANSTHORACIC ECHOCARDIOGRAM   2003   EF 55-65%; mild concentric LVH    OB History    No data available       Home Medications    Prior to Admission medications   Medication Sig Start Date End Date Taking? Authorizing Provider  albuterol (PROVENTIL HFA;VENTOLIN HFA) 108 (90 Base) MCG/ACT inhaler Inhale 1 puff into the lungs every 6 (six) hours as needed for wheezing or shortness of breath.    Historical Provider, MD  allopurinol (ZYLOPRIM) 100 MG tablet Take one tablet by mouth once daily for gout 09/21/16   Naitik Panwala, PA-C  aspirin EC 81 MG tablet Take 81 mg by mouth daily.    Historical Provider, MD  benzonatate (TESSALON) 100 MG capsule take 2 capsules by mouth three times a day if needed for cough 07/14/16   Kirt Boys, DO  calcium-vitamin D (OSCAL WITH D) 500-200 MG-UNIT per tablet Take 1 tablet by mouth daily.     Historical Provider, MD  Cholecalciferol (VITAMIN D-3) 1000 UNITS CAPS Take 1 capsule by mouth daily.    Historical Provider, MD  ferrous sulfate 325 (65 FE) MG tablet Take 325 mg by mouth daily.     Historical Provider, MD  folic acid (FOLVITE) 1 MG tablet Take 1 tablet (1 mg total) by mouth daily. 09/21/16 12/15/17  Naitik Panwala, PA-C  furosemide (LASIX) 20 MG tablet take 1 tablet by mouth once daily 09/28/16   Kirt Boys, DO  glucose blood (ONE TOUCH ULTRA TEST) test strip Check blood sugar once daily DX 250.00 05/18/14   Tiffany L Reed, DO  hydrALAZINE (APRESOLINE) 25 MG tablet take 1 tablet by mouth four times a day 11/16/16   Kirt Boys, DO  ketoconazole (NIZORAL) 2 % cream Apply 1 application topically 2 (two) times daily. 12/03/16   Elvina Sidle, MD  labetalol (NORMODYNE) 100 MG tablet take 2 tablet by mouth twice a day 06/22/16   Kirt Boys, DO  lisinopril (PRINIVIL,ZESTRIL) 30 MG tablet take 1 tablet by mouth once daily 10/14/16   Kimber Relic, MD  metFORMIN (GLUCOPHAGE) 500 MG tablet take 1 tablet by mouth once daily for diabetes 01/11/17   Kirt Boys, DO  mupirocin ointment  (BACTROBAN) 2 % Place 1 application into the nose 2 (two) times daily. 12/08/16   Helane Rima, DO  ORENCIA CLICKJECT 125 MG/ML SOAJ INJECT 1 PEN SUBCUTANEOUSLY WEEKLY 11/04/16   Pollyann Savoy, MD  OVER THE COUNTER MEDICATION Take 1 tablet by mouth 2 (two) times daily. Wellness formula for immune support    Historical Provider, MD  polyvinyl alcohol (LIQUIFILM TEARS) 1.4 % ophthalmic solution Place 1 drop into both eyes as needed (for dry eyes).    Historical Provider, MD  predniSONE (DELTASONE) 5 MG tablet 4po qAM x 5 days, 3po qAM x 5 days, 2po qAM x 5 days, 1po qAM x 5 days, 1/2po qAM x 5 days, then stop. 12/11/16   Naitik Panwala, PA-C  Tofacitinib Citrate (XELJANZ) 5 MG TABS Take 5 mg by mouth  daily. 12/11/16   Tawni Pummel, PA-C  triamcinolone cream (KENALOG) 0.1 % apply to affected area twice a day if needed for IRRITATION 06/22/16   Kimber Relic, MD  vitamin C (ASCORBIC ACID) 500 MG tablet Take 500 mg by mouth daily.    Historical Provider, MD    Family History Family History  Problem Relation Age of Onset  . Heart attack Father     Social History Social History  Substance Use Topics  . Smoking status: Never Smoker  . Smokeless tobacco: Never Used  . Alcohol use No     Allergies   Clonidine derivatives; Fish allergy; Shellfish allergy; Indomethacin; Lyrica [pregabalin]; Methyldopa; Cetirizine hcl; Codeine; Levaquin [levofloxacin in d5w]; and Tomato   Review of Systems Review of Systems  All other systems reviewed and are negative.    Physical Exam Updated Vital Signs BP 163/78   Pulse (!) 46   Temp 98.9 F (37.2 C) (Oral)   Resp 17   SpO2 99%   Physical Exam  Constitutional: She is oriented to person, place, and time. She appears well-developed and well-nourished.  HENT:  Head: Normocephalic and atraumatic.  Eyes: Conjunctivae are normal. Pupils are equal, round, and reactive to light. Right eye exhibits no discharge. Left eye exhibits no discharge. No  scleral icterus.  Neck: Normal range of motion. No JVD present. No tracheal deviation present.  Pulmonary/Chest: Effort normal. No stridor.  Neurological: She is alert and oriented to person, place, and time. Coordination normal.  Psychiatric: She has a normal mood and affect. Her behavior is normal. Judgment and thought content normal.  Nursing note and vitals reviewed.   ED Treatments / Results  Labs (all labs ordered are listed, but only abnormal results are displayed) Labs Reviewed  BASIC METABOLIC PANEL - Abnormal; Notable for the following:       Result Value   Glucose, Bld 128 (*)    GFR calc non Af Amer 51 (*)    GFR calc Af Amer 59 (*)    All other components within normal limits  CBC - Abnormal; Notable for the following:    RBC 3.38 (*)    Hemoglobin 10.6 (*)    HCT 32.1 (*)    RDW 17.3 (*)    All other components within normal limits  URINALYSIS, ROUTINE W REFLEX MICROSCOPIC - Abnormal; Notable for the following:    Leukocytes, UA TRACE (*)    Squamous Epithelial / LPF 0-5 (*)    All other components within normal limits  I-STAT CG4 LACTIC ACID, ED    EKG  EKG Interpretation  Date/Time:  Tuesday January 12 2017 11:48:04 EST Ventricular Rate:  85 PR Interval:  182 QRS Duration: 94 QT Interval:  408 QTC Calculation: 485 R Axis:   -26 Text Interpretation:  Normal sinus rhythm Left ventricular hypertrophy Inferior infarct , age undetermined Abnormal ECG similar to prior EKG  Confirmed by LIU MD, Annabelle Harman ([id number]) on 01/12/2017 3:15:06 PM       Radiology No results found.  Procedures Procedures (including critical care time)  Medications Ordered in ED Medications - No data to display   Initial Impression / Assessment and Plan / ED Course  I have reviewed the triage vital signs and the nursing notes.  Pertinent labs & imaging results that were available during my care of the patient were reviewed by me and considered in my medical decision making (see chart for  details).      Final Clinical Impressions(s) / ED  Diagnoses   Final diagnoses:  Weakness    Labs: Lactic acid, BMP, CBC  Imaging: EKG  Consults:  Therapeutics:  Discharge Meds:   Assessment/Plan: 83 year old female presents today with generalized weakness.  She is no longer weak or having presyncopal symptoms in my presence.  She has no significant findings on her labs or physical exam here.  Patient will have orthostatic vital signs, will be ambulated in the hall.  I have low suspicion for any intracranial, cardiac or pulmonary source of her weakness.  Patient will be discharged home with primary care follow-up.  Patient verbalized understanding and agreement to this plan and had no further questions or concerns at time of discharge    New Prescriptions Discharge Medication List as of 01/12/2017  4:01 PM       Eyvonne Mechanic, PA-C 01/12/17 2107    Lavera Guise, MD 01/13/17 [phone]

## 2017-01-12 NOTE — ED Triage Notes (Signed)
Pt arrives via [facility] from home with generalized weakness since Friday. Seen at urgent care 2 days ago. Pt reports symptoms continue. Pt A and O x4. cbg 156. 20g L FA.

## 2017-01-13 ENCOUNTER — Ambulatory Visit: Payer: Medicare Other | Admitting: Internal Medicine

## 2017-01-13 ENCOUNTER — Ambulatory Visit (INDEPENDENT_AMBULATORY_CARE_PROVIDER_SITE_OTHER): Payer: Medicare Other | Admitting: Family Medicine

## 2017-01-13 ENCOUNTER — Encounter: Payer: Self-pay | Admitting: Family Medicine

## 2017-01-13 VITALS — BP 154/88 | HR 85 | Temp 98.1°F | Ht 64.0 in | Wt 181.6 lb

## 2017-01-13 DIAGNOSIS — R8299 Other abnormal findings in urine: Secondary | ICD-10-CM | POA: Diagnosis not present

## 2017-01-13 DIAGNOSIS — D649 Anemia, unspecified: Secondary | ICD-10-CM | POA: Diagnosis not present

## 2017-01-13 DIAGNOSIS — I2510 Atherosclerotic heart disease of native coronary artery without angina pectoris: Secondary | ICD-10-CM

## 2017-01-13 DIAGNOSIS — I10 Essential (primary) hypertension: Secondary | ICD-10-CM

## 2017-01-13 DIAGNOSIS — R82998 Other abnormal findings in urine: Secondary | ICD-10-CM

## 2017-01-13 LAB — POCT URINALYSIS DIPSTICK
Bilirubin, UA: NEGATIVE
Blood, UA: NEGATIVE
Glucose, UA: NEGATIVE
Ketones, UA: NEGATIVE
Leukocytes, UA: NEGATIVE
Nitrite, UA: NEGATIVE
Protein, UA: NEGATIVE
Spec Grav, UA: 1.015
Urobilinogen, UA: 0.2
pH, UA: 6

## 2017-01-13 LAB — CBC WITH DIFFERENTIAL/PLATELET
Basophils Absolute: 0 10*3/uL (ref 0.0–0.1)
Basophils Relative: 0.7 % (ref 0.0–3.0)
Eosinophils Absolute: 0.2 10*3/uL (ref 0.0–0.7)
Eosinophils Relative: 2.9 % (ref 0.0–5.0)
HCT: 33.9 % — ABNORMAL LOW (ref 36.0–46.0)
Hemoglobin: 11.2 g/dL — ABNORMAL LOW (ref 12.0–15.0)
Lymphocytes Relative: 37.7 % (ref 12.0–46.0)
Lymphs Abs: 2.4 10*3/uL (ref 0.7–4.0)
MCHC: 33.2 g/dL (ref 30.0–36.0)
MCV: 97.1 fl (ref 78.0–100.0)
Monocytes Absolute: 0.4 10*3/uL (ref 0.1–1.0)
Monocytes Relative: 6.7 % (ref 3.0–12.0)
Neutro Abs: 3.3 10*3/uL (ref 1.4–7.7)
Neutrophils Relative %: 52 % (ref 43.0–77.0)
Platelets: 325 10*3/uL (ref 150.0–400.0)
RBC: 3.49 Mil/uL — ABNORMAL LOW (ref 3.87–5.11)
RDW: 18.1 % — ABNORMAL HIGH (ref 11.5–15.5)
WBC: 6.3 10*3/uL (ref 4.0–10.5)

## 2017-01-13 NOTE — Progress Notes (Signed)
Pre visit review using our clinic review tool, if applicable. No additional management support is needed unless otherwise documented below in the visit note. 

## 2017-01-13 NOTE — Progress Notes (Signed)
[name] [name] is a 83 y.o. female here today for a transitional care hospital follow up visit:  History of Present Illness:   ER visit follow up appointment.  I have reviewed the intake provided by my nurse or medical assistant today. I have reviewed the prior telephone transitional care contact documented in the patient's medical record.  Primary Problems Addressed During ER visit 1. Chief reason for visit was weakness. 2. ER visit date was 01/10/17, 01/12/17. 3. Medical records from the ER are available for review. 3. The patient d/c ER for the following primary diagnoses: Weakness, Cardiomegaly.  Medications (current list documented below) 1. None prescribed.  2. I have reviewed the medications with the patient and she can confirm the names of the medications. 3. The patient is taking the correct medications.  Diagnostic Tests, Consultations, and Other Outstanding Issues 1. There were not diagnostic tests still pending at the time of discharge. If applicable, these have been reviewed.  2. The patient does need additional tests or consults arranged. 3. Other issues needing attention today: none.  Patient Empowerment and Education 1. The patient or her caregiver can provide a brief explanation of the reason for ER visit and the nature of the care provided. 2. The patient or caregiver can explain how self-care/care in the home should be provided. 3. The patient and/or caregiver know what signs or symptoms that would indicate deterioration of the patient's condition and what should be done to best avoid rehospitalization if this occurs? yes 4. Additional educational needs were met today as follows: none needed  Home Status 1. The patient has has not had a significant decline in physical functioning since ER visit. The patient is independent in performing ADL's. 2. The patient has not had a significant decline in cognitive functioning as a result of hospitalization. 3. Home Health  services have been set up for the patient. The patient is receiving these services.   4. The patient is receiving care management services.   Communication 1. I have not communicated with other members of the patient's care team such as the PCP, specialty physicians, care management, home health agency, or allied health providers about the patient today. N/A 2. I have not communicated to the patient's pharmacy about discontinued medication(s). N/A     PMHx, SurgHx, SocialHx, Medications, and Allergies were reviewed in the Visit Navigator and updated as appropriate.  Current Medications:   .  albuterol (PROVENTIL HFA;VENTOLIN HFA) 108 (90 Base) MCG/ACT inhaler, Inhale 1 puff into the lungs every 6 (six) hours as needed for wheezing or shortness of breath .  allopurinol (ZYLOPRIM) 100 MG tablet, Take one tablet by mouth once daily for gout, Disp: 30 tablet, Rfl: 1 .  aspirin EC 81 MG tablet, Take 81 mg by mouth daily., Disp: , Rfl:  .  calcium-vitamin D (OSCAL WITH D) 500-200 MG-UNIT per tablet, Take 1 tablet by mouth daily. , Disp: , Rfl:  .  Cholecalciferol (VITAMIN D-3) 1000 UNITS CAPS, Take 1 capsule by mouth daily., Disp: , Rfl:  .  ferrous sulfate 325 (65 FE) MG tablet, Take 325 mg by mouth daily. , Disp: , Rfl:  .  folic acid (FOLVITE) 1 MG tablet, Take 1 tablet (1 mg total) by mouth daily., Disp: 90 tablet, Rfl: 4 .  furosemide (LASIX) 20 MG tablet, take 1 tablet by mouth once daily, Disp: 30 tablet, Rfl: 3 .  hydrALAZINE (APRESOLINE) 25 MG tablet, take 1 tablet by mouth four times a day, Disp: 120  tablet, Rfl: 6 .  labetalol (NORMODYNE) 100 MG tablet, take 2 tablet by mouth twice a day, Disp: 120 tablet, Rfl: 5 .  lisinopril (PRINIVIL,ZESTRIL) 30 MG tablet, take 1 tablet by mouth once daily, Disp: 30 tablet, Rfl: 5 .  metFORMIN (GLUCOPHAGE) 500 MG tablet, take 1 tablet by mouth once daily for diabetes, Disp: 90 tablet, Rfl: 1 .  ORENCIA CLICKJECT 253 MG/ML SOAJ, INJECT 1 PEN  SUBCUTANEOUSLY WEEKLY, Disp: 4 mL, Rfl: 2 .  Tofacitinib Citrate (XELJANZ) 5 MG TABS, Take 5 mg by mouth daily., Disp: 30 tablet, Rfl: 2 .  vitamin C (ASCORBIC ACID) 500 MG tablet, Take 500 mg by mouth daily., Disp: , Rfl:     Review of Systems:   Review of Systems  Constitutional: Negative for chills, diaphoresis, fever, malaise/fatigue and weight loss.  Respiratory: Negative for cough and shortness of breath.   Cardiovascular: Negative for chest pain, palpitations and leg swelling.  Gastrointestinal: Negative for abdominal pain, constipation, diarrhea, nausea and vomiting.  Genitourinary: Negative for dysuria.  Skin: Negative for rash.  Neurological: Negative for dizziness, weakness and headaches.  Psychiatric/Behavioral: Negative for depression.    Vitals:   Vitals:   01/13/17 1136  BP: (!) 154/88  Pulse: 85  Temp: 98.1 F (36.7 C)  TempSrc: Oral  SpO2: 98%  Weight: 181 lb 9.6 oz (82.4 kg)  Height: _0  (1.626 m)     Body mass index is 31.17 kg/m.  Physical Exam:   General: Alert, cooperative, appears stated age and no distress.  HEENT:  Normocephalic, without obvious abnormality, atraumatic. Conjunctivae/corneas clear. PERRL, EOM's intact. Normal TM's and external ear canals both ears. Nares normal. Septum midline. Mucosa normal. No drainage or sinus tenderness. Lips, mucosa, and tongue normal; teeth and gums normal.  Lungs: Clear to auscultation bilaterally.  Heart:: Regular rate and rhythm, S1, S2 normal, no murmur, click, rub or gallop.  Abdomen: Soft, non-tender; bowel sounds normal; no masses,  no organomegaly.  Extremities: Extremities normal, atraumatic, no cyanosis or edema.  Pulses: 2+ and symmetric.  Skin: Skin color, texture, turgor normal. No rashes or lesions.  Neurologic: Alert and oriented X 3, normal strength and tone. Normal symmetric. reflexes. Normal coordination and gait.  Psych: Alert,oriented, in NAD with a full range of affect, normal behavior  and no psychotic features    Results for orders placed or performed in visit on 01/13/17  CBC with Differential/Platelet  Result Value Ref Range   WBC 6.3 4.0 - 10.5 K/uL   RBC 3.49 (L) 3.87 - 5.11 Mil/uL   Hemoglobin 11.2 (L) 12.0 - 15.0 g/dL   HCT 33.9 (L) 36.0 - 46.0 %   MCV 97.1 78.0 - 100.0 fl   MCHC 33.2 30.0 - 36.0 g/dL   RDW 18.1 (H) 11.5 - 15.5 %   Platelets 325.0 150.0 - 400.0 K/uL   Neutrophils Relative % 52.0 43.0 - 77.0 %   Lymphocytes Relative 37.7 12.0 - 46.0 %   Monocytes Relative 6.7 3.0 - 12.0 %   Eosinophils Relative 2.9 0.0 - 5.0 %   Basophils Relative 0.7 0.0 - 3.0 %   Neutro Abs 3.3 1.4 - 7.7 K/uL   Lymphs Abs 2.4 0.7 - 4.0 K/uL   Monocytes Absolute 0.4 0.1 - 1.0 K/uL   Eosinophils Absolute 0.2 0.0 - 0.7 K/uL   Basophils Absolute 0.0 0.0 - 0.1 K/uL  POC Urinalysis Dipstick  Result Value Ref Range   Color, UA Yellow    Clarity, UA Slightly Cloudy  Glucose, UA Negative    Bilirubin, UA Negative    Ketones, UA Negative    Spec Grav, UA 1.015    Blood, UA Negative    pH, UA 6.0    Protein, UA Negative    Urobilinogen, UA 0.2    Nitrite, UA Negative    Leukocytes, UA Negative Negative    Assessment and Plan:   Diahn was seen today for follow-up.  Diagnoses and all orders for this visit:  Essential hypertension Comments: No signs of complications, medication side effects, or red flags.  Continue current regimen.    Leukocytes in urine Comments: Asymptomatic. Will culture urine.  Orders: -     POC Urinalysis Dipstick -     Urine culture  Anemia, unspecified type Comments: Rechecked today. Stable.  Orders: -     CBC with Differential/Platelet   . Reviewed expectations re: course of current medical issues. . Discussed self-management of symptoms. . Outlined signs and symptoms indicating need for more acute intervention. . Patient verbalized understanding and all questions were answered. . See orders for this visit as documented in the  electronic medical record. . Patient received an After-Visit Summary.   Briscoe Deutscher, D.O.

## 2017-01-13 NOTE — Progress Notes (Deleted)
Test note.

## 2017-01-15 LAB — URINE CULTURE

## 2017-01-16 ENCOUNTER — Other Ambulatory Visit: Payer: Self-pay | Admitting: Internal Medicine

## 2017-01-16 ENCOUNTER — Other Ambulatory Visit: Payer: Self-pay | Admitting: Rheumatology

## 2017-01-18 NOTE — Telephone Encounter (Signed)
Last visit 11/28/16 Next visit 03/04/17 Labs 12/10/16 show creat 1.13 She has had some recent illnesses/ will you review chart and let me know if ok to continue to refill MTX ?

## 2017-01-18 NOTE — Telephone Encounter (Signed)
OK TO continue mtx

## 2017-01-19 ENCOUNTER — Ambulatory Visit (INDEPENDENT_AMBULATORY_CARE_PROVIDER_SITE_OTHER): Payer: Medicare Other | Admitting: Family Medicine

## 2017-01-19 ENCOUNTER — Encounter: Payer: Self-pay | Admitting: Family Medicine

## 2017-01-19 ENCOUNTER — Telehealth: Payer: Self-pay | Admitting: Pharmacist

## 2017-01-19 VITALS — BP 134/82 | HR 83 | Temp 98.5°F | Ht 64.0 in | Wt 184.4 lb

## 2017-01-19 DIAGNOSIS — T8189XA Other complications of procedures, not elsewhere classified, initial encounter: Secondary | ICD-10-CM

## 2017-01-19 DIAGNOSIS — Z79899 Other long term (current) drug therapy: Secondary | ICD-10-CM

## 2017-01-19 DIAGNOSIS — M06042 Rheumatoid arthritis without rheumatoid factor, left hand: Secondary | ICD-10-CM

## 2017-01-19 DIAGNOSIS — M06041 Rheumatoid arthritis without rheumatoid factor, right hand: Secondary | ICD-10-CM

## 2017-01-19 DIAGNOSIS — I2510 Atherosclerotic heart disease of native coronary artery without angina pectoris: Secondary | ICD-10-CM

## 2017-01-19 NOTE — Progress Notes (Signed)
[name] [name] is a 83 y.o. female here for a recurrence of a previously resolved problem.  History of Present Illness:   Chief Complaint  Patient presents with  . Acute Visit  . Nonhealing Wound   CC:  Patient complains of a nonhealing wound on her right lower extremity.  She states this is not painful but is draining some fluid.  She has tried Bactroban ointment with no relief.  Hx of RA. Followed by Rheumatology. Rx [name] [name] started on 12/11/16. Lower extremity nodules worsening. Left mid shin nodule previously biopsied by Dr. Jorja Loa, Dermatology. Dx rheumatoid nodule. Unfortunately, the wound has not healed since the biopsy. She now reports more nodules - two on the left lower leg, and one on the right lower leg.    PMHx, SurgHx, SocialHx, Medications, and Allergies were reviewed in the Visit Navigator and updated as appropriate.  Current Medications:   .  albuterol (PROVENTIL HFA;VENTOLIN HFA) 108 (90 Base) MCG/ACT inhaler, Inhale 1 puff into the lungs every 6 (six) hours as needed for wheezing or shortness of breath., Disp: , Rfl:  .  allopurinol (ZYLOPRIM) 100 MG tablet, Take one tablet by mouth once daily for gout, Disp: 30 tablet, Rfl: 1 .  aspirin EC 81 MG tablet, Take 81 mg by mouth daily., Disp: , Rfl:  .  calcium-vitamin D (OSCAL WITH D) 500-200 MG-UNIT per tablet, Take 1 tablet by mouth daily. , Disp: , Rfl:  .  Cholecalciferol (VITAMIN D-3) 1000 UNITS CAPS, Take 1 capsule by mouth daily., Disp: , Rfl:  .  ferrous sulfate 325 (65 FE) MG tablet, Take 325 mg by mouth daily. , Disp: , Rfl:  .  folic acid (FOLVITE) 1 MG tablet, Take 1 tablet (1 mg total) by mouth daily., Disp: 90 tablet, Rfl: 4 .  furosemide (LASIX) 20 MG tablet, take 1 tablet by mouth once daily, Disp: 30 tablet, Rfl: 3 .  hydrALAZINE (APRESOLINE) 25 MG tablet, take 1 tablet by mouth four times a day, Disp: 120 tablet, Rfl: 6 .  ketoconazole (NIZORAL) 2 % cream, Apply 1 application topically 2 (two) times daily.,  Disp: 30 g, Rfl: 0 .  labetalol (NORMODYNE) 100 MG tablet, take 2 tablet by mouth twice a day, Disp: 120 tablet, Rfl: 5 .  lisinopril (PRINIVIL,ZESTRIL) 30 MG tablet, take 1 tablet by mouth once daily, Disp: 30 tablet, Rfl: 5 .  metFORMIN (GLUCOPHAGE) 500 MG tablet, take 1 tablet by mouth once daily for diabetes, Disp: 90 tablet, Rfl: 1 .  mupirocin ointment (BACTROBAN) 2 %, Place 1 application into the nose 2 (two) times daily., Disp: 22 g, Rfl: 0 .  Tofacitinib Citrate (XELJANZ) 5 MG TABS, Take 5 mg by mouth daily., Disp: 30 tablet, Rfl: 2 .  triamcinolone cream (KENALOG) 0.1 %, apply to affected area twice a day if needed for IRRITATION, Disp: 45 g, Rfl: 2 .  vitamin C (ASCORBIC ACID) 500 MG tablet, Take 500 mg by mouth daily., Disp: , Rfl:     Review of Systems:   Review of Systems  Constitutional: Negative for chills, fever and weight loss.  HENT: Negative for congestion.   Respiratory: Negative for cough.   Cardiovascular: Negative for chest pain and palpitations.  Gastrointestinal: Negative for abdominal pain.  Genitourinary: Negative for frequency.  Musculoskeletal: Negative for back pain.  Skin: Negative for rash.  Neurological: Negative for loss of consciousness, weakness and headaches.    Vitals:   Vitals:   01/19/17 1413  BP: 134/82  Pulse:  83  Temp: 98.5 F (36.9 C)  TempSrc: Oral  SpO2: 95%  Weight: 184 lb 6.4 oz (83.6 kg)  Height: 5\' 4"  (1.626 m)     Body mass index is 31.65 kg/m.  Physical Exam:   Physical Exam  Constitutional: She appears well-developed and well-nourished. No distress.  Cardiovascular: Normal rate and regular rhythm.   Pulmonary/Chest: Effort normal and breath sounds normal.  Abdominal: Soft. Bowel sounds are normal.  Skin: Capillary refill takes less than 2 seconds.  See picture below of left shin.  Psychiatric: She has a normal mood and affect.     Results for orders placed or performed in visit on 01/13/17  CBC with  Differential/Platelet  Result Value Ref Range   WBC 6.3 4.0 - 10.5 K/uL   RBC 3.49 (L) 3.87 - 5.11 Mil/uL   Hemoglobin 11.2 (L) 12.0 - 15.0 g/dL   HCT 03/15/17 (L) 94.5 - 85.9 %   MCV 97.1 78.0 - 100.0 fl   MCHC 33.2 30.0 - 36.0 g/dL   RDW 29.2 (H) 44.6 - 28.6 %   Platelets 325.0 150.0 - 400.0 K/uL   Neutrophils Relative % 52.0 43.0 - 77.0 %   Lymphocytes Relative 37.7 12.0 - 46.0 %   Monocytes Relative 6.7 3.0 - 12.0 %   Eosinophils Relative 2.9 0.0 - 5.0 %   Basophils Relative 0.7 0.0 - 3.0 %   Neutro Abs 3.3 1.4 - 7.7 K/uL   Lymphs Abs 2.4 0.7 - 4.0 K/uL   Monocytes Absolute 0.4 0.1 - 1.0 K/uL   Eosinophils Absolute 0.2 0.0 - 0.7 K/uL   Basophils Absolute 0.0 0.0 - 0.1 K/uL  POC Urinalysis Dipstick  Result Value Ref Range   Color, UA Yellow    Clarity, UA Slightly Cloudy    Glucose, UA Negative    Bilirubin, UA Negative    Ketones, UA Negative    Spec Grav, UA 1.015    Blood, UA Negative    pH, UA 6.0    Protein, UA Negative    Urobilinogen, UA 0.2    Nitrite, UA Negative    Leukocytes, UA Negative Negative    Assessment and Plan:   [name] [name] was seen today for acute visit and nonhealing wound.  Diagnoses and all orders for this visit:  Non-healing surgical wound, initial encounter Comments: Wound culture obtained. Area debrided, bacitracin added, and bandaged. Wound care reviewed. To Dermatology for follow up. Orders: -     WOUND CULTURE -     Ambulatory referral to Dermatology  Rheumatoid arthritis involving both hands with negative rheumatoid factor (HCC) Comments: Patient with rheumatoid nodules. No evidence of vasculitis.    . Reviewed expectations re: course of current medical issues. . Discussed self-management of symptoms. . Outlined signs and symptoms indicating need for more acute intervention. . Patient verbalized understanding and all questions were answered. . See orders for this visit as documented in the electronic medical record. . Patient  received an After-Visit Summary.  Jerrye Beavers, CMA, acting as scribe for Dr. Insurance claims handler.   Earlene Plater, D.O.

## 2017-01-19 NOTE — Patient Instructions (Signed)
It was great to see you today!  I am referring you back to Dr. Jorja Loa to check that wound.

## 2017-01-19 NOTE — Telephone Encounter (Signed)
Patient started [name] [name] on 12/11/16.  Patient was due for CBC and CMP one month after starting [name] [name], and was due for lipid panel 4-8 weeks after starting [name] [name].  Noted patient had CBC on 01/13/17 (stable) and had BMP on 01/12/17 (stable).  Patient is due for hepatic function panel and fasting lipid panel.  I called patient to advise.  Patient voiced understanding and reports she will talk to her daughter about bringing her in for labs one day this week.  Patient denies any questions or concerns regarding her medications at this time.    Lilla Shook, Pharm.D., BCPS, CPP Clinical Pharmacist Pager: [phone] Phone: [phone] 01/19/2017 12:16 PM

## 2017-01-20 ENCOUNTER — Telehealth: Payer: Self-pay | Admitting: Rheumatology

## 2017-01-20 NOTE — Telephone Encounter (Signed)
Patient has been advised to discontinued her [name] [name] and her MTX until her wound heals. Patient advised she would need clearance from the wound care center. Patient verbalized understanding.

## 2017-01-20 NOTE — Telephone Encounter (Signed)
I received a message from wound center the patient has a nonhealing wound. She is currently on [place]. Please call patient and advise her to stop the [place]. We will have to hold Xeljanz until her wound is healed and then she can restart that. She will have to get clearance from the wound center. She should hold methotrexate as well.

## 2017-01-21 ENCOUNTER — Other Ambulatory Visit: Payer: Self-pay | Admitting: *Deleted

## 2017-01-21 DIAGNOSIS — Z79899 Other long term (current) drug therapy: Secondary | ICD-10-CM

## 2017-01-21 LAB — HEPATIC FUNCTION PANEL
ALT: 8 U/L (ref 6–29)
AST: 20 U/L (ref 10–35)
Albumin: 3.7 g/dL (ref 3.6–5.1)
Alkaline Phosphatase: 56 U/L (ref 33–130)
Bilirubin, Direct: 0.1 mg/dL (ref <=0.2)
Indirect Bilirubin: 0.3 mg/dL (ref 0.2–1.2)
Total Bilirubin: 0.4 mg/dL (ref 0.2–1.2)
Total Protein: 6.4 g/dL (ref 6.1–8.1)

## 2017-01-21 LAB — LIPID PANEL
Cholesterol: 198 mg/dL (ref <200)
HDL: 77 mg/dL (ref >50)
LDL Cholesterol: 108 mg/dL — ABNORMAL HIGH (ref <100)
Total CHOL/HDL Ratio: 2.6 Ratio (ref <5.0)
Triglycerides: 65 mg/dL (ref <150)
VLDL: 13 mg/dL (ref <30)

## 2017-01-22 ENCOUNTER — Emergency Department (HOSPITAL_COMMUNITY)
Admission: EM | Admit: 2017-01-22 | Discharge: 2017-01-23 | Disposition: A | Discharge status: Home / Self Care | Payer: Medicare Other | Attending: Emergency Medicine | Admitting: Emergency Medicine

## 2017-01-22 ENCOUNTER — Telehealth: Payer: Self-pay

## 2017-01-22 ENCOUNTER — Telehealth: Payer: Self-pay | Admitting: Family Medicine

## 2017-01-22 ENCOUNTER — Encounter (HOSPITAL_COMMUNITY): Payer: Self-pay

## 2017-01-22 DIAGNOSIS — J45909 Unspecified asthma, uncomplicated: Secondary | ICD-10-CM | POA: Insufficient documentation

## 2017-01-22 DIAGNOSIS — I5033 Acute on chronic diastolic (congestive) heart failure: Secondary | ICD-10-CM | POA: Diagnosis not present

## 2017-01-22 DIAGNOSIS — Z96651 Presence of right artificial knee joint: Secondary | ICD-10-CM | POA: Insufficient documentation

## 2017-01-22 DIAGNOSIS — N183 Chronic kidney disease, stage 3 (moderate): Secondary | ICD-10-CM | POA: Diagnosis not present

## 2017-01-22 DIAGNOSIS — I10 Essential (primary) hypertension: Secondary | ICD-10-CM

## 2017-01-22 DIAGNOSIS — I2510 Atherosclerotic heart disease of native coronary artery without angina pectoris: Secondary | ICD-10-CM | POA: Insufficient documentation

## 2017-01-22 DIAGNOSIS — E1122 Type 2 diabetes mellitus with diabetic chronic kidney disease: Secondary | ICD-10-CM | POA: Insufficient documentation

## 2017-01-22 DIAGNOSIS — I130 Hypertensive heart and chronic kidney disease with heart failure and stage 1 through stage 4 chronic kidney disease, or unspecified chronic kidney disease: Secondary | ICD-10-CM | POA: Diagnosis present

## 2017-01-22 DIAGNOSIS — Z7984 Long term (current) use of oral hypoglycemic drugs: Secondary | ICD-10-CM | POA: Insufficient documentation

## 2017-01-22 DIAGNOSIS — Z7982 Long term (current) use of aspirin: Secondary | ICD-10-CM | POA: Diagnosis not present

## 2017-01-22 DIAGNOSIS — Z8673 Personal history of transient ischemic attack (TIA), and cerebral infarction without residual deficits: Secondary | ICD-10-CM | POA: Diagnosis not present

## 2017-01-22 LAB — WOUND CULTURE
Gram Stain: NONE SEEN
Gram Stain: NONE SEEN
Gram Stain: NONE SEEN
Organism ID, Bacteria: NO GROWTH

## 2017-01-22 LAB — I-STAT CHEM 8, ED
BUN: 18 mg/dL (ref 6–20)
Calcium, Ion: 1.2 mmol/L (ref 1.15–1.40)
Chloride: 103 mmol/L (ref 101–111)
Creatinine, Ser: 0.9 mg/dL (ref 0.44–1.00)
Glucose, Bld: 95 mg/dL (ref 65–99)
HCT: 32 % — ABNORMAL LOW (ref 36.0–46.0)
Hemoglobin: 10.9 g/dL — ABNORMAL LOW (ref 12.0–15.0)
Potassium: 3.5 mmol/L (ref 3.5–5.1)
Sodium: 139 mmol/L (ref 135–145)
TCO2: 25 mmol/L (ref 0–100)

## 2017-01-22 MED ORDER — HYDRALAZINE HCL 50 MG PO TABS
25.0000 mg | ORAL_TABLET | Freq: Once | ORAL | Status: AC
  Administered 2017-01-22: 25 mg via ORAL
  Filled 2017-01-22: qty 1

## 2017-01-22 NOTE — ED Notes (Signed)
Pt aware of wait to treatment room pt given warm blanket

## 2017-01-22 NOTE — Progress Notes (Signed)
Labs normal. LDL mildly elevated

## 2017-01-22 NOTE — Telephone Encounter (Signed)
LM to return call.

## 2017-01-22 NOTE — ED Triage Notes (Signed)
Pt sent here by PCP. Pt states checked BP, told 200's/100's. Pt denies any headache or dizziness. Pt denies any SOB or CP. Pt states hx of hypertension, taking medications as Rx'd. Pt with no complaints.

## 2017-01-22 NOTE — Telephone Encounter (Signed)
Beth, PTA, from [employer] called to report a blood pressure reading of 210/120 (sitting). Pt is asymptomatic at time. I did advise daughter to take pt to ER for evaluation. I reviewed this with Lelon Mast and Eunice Blase which they both agree with ER recommendations. Will route to Dr. Earlene Plater as Lorain Childes.

## 2017-01-22 NOTE — Telephone Encounter (Signed)
PPA, Beth, with [facility] is calling because patient's bp is dangerously High.  She would like to know what Dr. Earlene Plater would like the patient to do.    Thank you,  -LL

## 2017-01-22 NOTE — ED Provider Notes (Addendum)
[facility]-EMERGENCY DEPT Provider Note   CSN: [id number] Arrival date & time: 01/22/17  1637     History   Chief Complaint Chief Complaint  Patient presents with  . Hypertension    HPI [name] [name] is a 83 y.o. female PMH HTN, CAD, DM2 presents for hypertension. Pt states her therapist checked her blood pressure at home today and noted BP 200s/100s. She states normal SBP 150-160s. No missed medications. Denies HA, dizziness, visual changes, CP, SOB, abdominal pain.   The history is provided by the patient, a relative and medical records.  Hypertension  This is a chronic problem. The current episode started 3 to 5 hours ago. The problem occurs constantly. The problem has not changed since onset.Pertinent negatives include no chest pain, no abdominal pain, no headaches and no shortness of breath. She has tried nothing for the symptoms.    Past Medical History:  Diagnosis Date  . Allergic rhinitis due to pollen 04/27/2007  . Arthritis    "in q joint" (08/07/2013)  . Coronary atherosclerosis of native coronary artery   . Cough   . Disorder of bone and cartilage, unspecified   . Edema 05/03/2013  . Exertional shortness of breath    "sometimes" (08/07/2013)  . First degree atrioventricular block   . Gout, unspecified 04/19/2013  . Hyperlipidemia 04/27/2007  . Hypertension   . Muscle spasm   . Muscle weakness (generalized)   . Myocardial infarction ~ 1970  . Onychia and paronychia of toe   . Osteoarthrosis, unspecified whether generalized or localized, unspecified site 04/27/2007  . Other fall   . Other malaise and fatigue   . Prepatellar bursitis   . Seizures (HCC)   . Stroke (HCC) 01/17/2014  . TIA (transient ischemic attack)    "a few one summer" (08/07/2013)  . Type II diabetes mellitus (HCC)    "fasting 90-110s" (08/07/2013)  . Unspecified essential hypertension 08/08/2013  . Unspecified vitamin D deficiency     Patient Active Problem List   Diagnosis Date Noted  .  Obesity (BMI 30.0-34.9) 12/08/2016  . High risk medications (not anticoagulants) long-term use 09/21/2016  . Hammer toe 10/31/2015  . Acute on chronic diastolic heart failure (HCC) 09/12/2015  . Onychomycosis 04/24/2015  . Midline low back pain without sciatica 06/27/2014  . Bilateral edema of lower extremity 06/05/2014  . CKD (chronic kidney disease) stage 3, GFR 30-59 ml/min 06/05/2014  . DM (diabetes mellitus), type 2 with renal complications (HCC) 06/05/2014    Class: Chronic  . Anemia of chronic disease 05/01/2014  . Fluctuating blood pressure 02/06/2014  . Stroke (HCC) 01/17/2014  . Rheumatoid arthritis (HCC) 12/19/2013    Class: Chronic  . CAD (coronary artery disease) 10/18/2013  . HTN (hypertension) 08/08/2013    Class: Chronic  . Fibromyalgia 05/10/2013  . Reactive airway disease 04/19/2013  . Gout 04/19/2013  . Hyperlipidemia 04/27/2007  . Allergic rhinitis 04/27/2007  . Diverticulosis 04/27/2007    Past Surgical History:  Procedure Laterality Date  . ABDOMINAL HYSTERECTOMY  04/1980  . APPENDECTOMY  1960  . CARDIAC CATHETERIZATION  2003  . CATARACT EXTRACTION W/ INTRAOCULAR LENS  IMPLANT, BILATERAL Bilateral ~ 2012  . JOINT REPLACEMENT    . REPLACEMENT TOTAL KNEE Right 09/2005  . SHOULDER OPEN ROTATOR CUFF REPAIR Right 07/1999  . TONSILLECTOMY  08/1974  . TOTAL KNEE ARTHROPLASTY Left 08/07/2013  . TOTAL KNEE ARTHROPLASTY Left 08/07/2013   Procedure: TOTAL KNEE ARTHROPLASTY- LEFT;  Surgeon: Dannielle Huh, MD;  Location: [facility] OR;  Service: Orthopedics;  Laterality: Left;  . TRANSTHORACIC ECHOCARDIOGRAM  2003   EF 55-65%; mild concentric LVH    OB History    No data available       Home Medications    Prior to Admission medications   Medication Sig Start Date End Date Taking? Authorizing Provider  albuterol (PROVENTIL HFA;VENTOLIN HFA) 108 (90 Base) MCG/ACT inhaler Inhale 1 puff into the lungs every 6 (six) hours as needed for wheezing or shortness of breath.    Yes Historical Provider, MD  allopurinol (ZYLOPRIM) 100 MG tablet Take one tablet by mouth once daily for gout 09/21/16  Yes Naitik Panwala, PA-C  aspirin EC 81 MG tablet Take 81 mg by mouth daily.   Yes Historical Provider, MD  calcium-vitamin D (OSCAL WITH D) 500-200 MG-UNIT per tablet Take 1 tablet by mouth daily.    Yes Historical Provider, MD  Cholecalciferol (VITAMIN D-3) 1000 UNITS CAPS Take 1 capsule by mouth daily.   Yes Historical Provider, MD  ferrous sulfate 325 (65 FE) MG tablet Take 325 mg by mouth daily.    Yes Historical Provider, MD  folic acid (FOLVITE) 1 MG tablet Take 1 tablet (1 mg total) by mouth daily. 09/21/16 12/15/17 Yes Naitik Panwala, PA-C  furosemide (LASIX) 20 MG tablet take 1 tablet by mouth once daily 09/28/16  Yes Kirt Boys, DO  glucose blood (ONE TOUCH ULTRA TEST) test strip Check blood sugar once daily DX 250.00 05/18/14  Yes Tiffany L Reed, DO  hydrALAZINE (APRESOLINE) 25 MG tablet take 1 tablet by mouth four times a day 11/16/16  Yes Kirt Boys, DO  hydroxypropyl methylcellulose / hypromellose (ISOPTO TEARS / GONIOVISC) 2.5 % ophthalmic solution Place 1 drop into both eyes 4 (four) times daily as needed for dry eyes.   Yes Historical Provider, MD  ketoconazole (NIZORAL) 2 % cream Apply 1 application topically 2 (two) times daily. Patient taking differently: Apply 1 application topically 2 (two) times daily as needed for irritation.  12/03/16  Yes Elvina Sidle, MD  labetalol (NORMODYNE) 100 MG tablet take 2 tablet by mouth twice a day 06/22/16  Yes Kirt Boys, DO  lisinopril (PRINIVIL,ZESTRIL) 30 MG tablet take 1 tablet by mouth once daily 10/14/16  Yes Kimber Relic, MD  metFORMIN (GLUCOPHAGE) 500 MG tablet take 1 tablet by mouth once daily for diabetes 01/11/17  Yes Kirt Boys, DO  methotrexate (RHEUMATREX) 2.5 MG tablet take 3 tablets by mouth ONCE A WEEK 01/18/17  Yes Naitik Panwala, PA-C  mupirocin ointment (BACTROBAN) 2 % Place 1 application into the  nose 2 (two) times daily. Patient taking differently: Place 1 application into the nose 2 (two) times daily as needed (dermatitis).  12/08/16  Yes Helane Rima, DO  OVER THE COUNTER MEDICATION Take 1 tablet by mouth 2 (two) times daily. Wellness formula for immune support   Yes Historical Provider, MD  polyvinyl alcohol (LIQUIFILM TEARS) 1.4 % ophthalmic solution Place 1 drop into both eyes as needed (for dry eyes).   Yes Historical Provider, MD  Tofacitinib Citrate (XELJANZ) 5 MG TABS Take 5 mg by mouth daily. 12/11/16  Yes Naitik Panwala, PA-C  traMADol (ULTRAM) 50 MG tablet Take 50 mg by mouth every 6 (six) hours as needed for moderate pain.   Yes Historical Provider, MD  triamcinolone cream (KENALOG) 0.1 % apply to affected area twice a day if needed for IRRITATION 01/18/17  Yes Kimber Relic, MD  vitamin C (ASCORBIC ACID) 500 MG tablet Take 500 mg by  mouth daily.   Yes Historical Provider, MD    Family History Family History  Problem Relation Age of Onset  . Heart attack Father     Social History Social History  Substance Use Topics  . Smoking status: Never Smoker  . Smokeless tobacco: Never Used  . Alcohol use No     Allergies   Clonidine derivatives; Fish allergy; Shellfish allergy; Indomethacin; Lyrica [pregabalin]; Methyldopa; Cetirizine hcl; Codeine; Levaquin [levofloxacin in d5w]; and Tomato   Review of Systems Review of Systems  Constitutional: Negative for appetite change, fatigue and fever.  Eyes: Negative for visual disturbance.  Respiratory: Negative for chest tightness and shortness of breath.   Cardiovascular: Negative for chest pain, palpitations and leg swelling.  Gastrointestinal: Negative for abdominal pain, diarrhea and vomiting.  Musculoskeletal: Negative for back pain, neck pain and neck stiffness.  Neurological: Negative for dizziness, tremors, seizures, syncope, speech difficulty, weakness, light-headedness and headaches.  All other systems reviewed and  are negative.    Physical Exam Updated Vital Signs BP (!) 181/83   Pulse 92   Temp 98.7 F (37.1 C)   Resp 13   SpO2 98%   Physical Exam  Constitutional: She is oriented to person, place, and time. She appears well-developed and well-nourished. No distress.  HENT:  Head: Normocephalic and atraumatic.  Eyes: Conjunctivae and EOM are normal. Pupils are equal, round, and reactive to light.  Neck: Normal range of motion. Neck supple.  Cardiovascular: Normal rate and regular rhythm.   No murmur heard. Pulmonary/Chest: Effort normal and breath sounds normal. No respiratory distress.  Abdominal: Soft. She exhibits no distension. There is no tenderness.  Musculoskeletal: Normal range of motion. She exhibits no edema.  Neurological: She is alert and oriented to person, place, and time. No cranial nerve deficit. GCS eye subscore is 4. GCS verbal subscore is 5. GCS motor subscore is 6.  Symmetric 5/5 UE and LE strength  Skin: Skin is warm and dry. Capillary refill takes less than 2 seconds.  Psychiatric: She has a normal mood and affect.  Nursing note and vitals reviewed.    ED Treatments / Results  Labs (all labs ordered are listed, but only abnormal results are displayed) Labs Reviewed  I-STAT CHEM 8, ED - Abnormal; Notable for the following:       Result Value   Hemoglobin 10.9 (*)    HCT 32.0 (*)    All other components within normal limits    EKG  EKG Interpretation None       Radiology No results found.  Procedures Procedures (including critical care time)  Medications Ordered in ED Medications  hydrALAZINE (APRESOLINE) tablet 25 mg (25 mg Oral Given 01/22/17 2320)     Initial Impression / Assessment and Plan / ED Course  I have reviewed the triage vital signs and the nursing notes.  Pertinent labs & imaging results that were available during my care of the patient were reviewed by me and considered in my medical decision making (see chart for details).      83 y.o. female p/w HTN. Asymptomatic. No symptoms concerning for hypertensive emergency. Physical exam unremarkable. BUN/Cr baseline. Given home PO hydralazine. Pt is currently taking hydralazine, lasix, labetalol and lisinopril. Will not adjust dosages at this time. Advised close follow-up with PCP for monitoring of BP and potential adjustment of medication. Return precautions given. Patient and family voiced understanding and agreement with plan.   Discussed with my attending physician, Dr Jodi Mourning  Final Clinical Impressions(s) /  ED Diagnoses   Final diagnoses:  Hypertension, unspecified type    New Prescriptions Discharge Medication List as of 01/22/2017 11:30 PM       Pablo Ledger, MD 01/23/17 0106  Medical screening examination/treatment/procedure(s) were conducted as a shared visit with non-physician practitioner(s) or resident  and myself.  I personally evaluated the patient during the encounter and agree with the findings.   I have personally reviewed any xrays and/ or EKG's with the provider and I agree with interpretation.   Hypertension, unspecified type    Blane Ohara, MD 01/25/17 0031    Blane Ohara, MD 01/25/17 4270

## 2017-01-23 NOTE — ED Notes (Signed)
Discharge instructions reviewed - voiced understanding 

## 2017-01-24 NOTE — Telephone Encounter (Signed)
Patient with known fluctuations in blood pressures. Call to advise patient or caregiver to monitor BPs daily and keep a log. Follow up in 2 weeks if BPs continue >170/100. Make sure to call if symptomatic (CP, HA, dizziness, edema, vision changes).

## 2017-01-25 ENCOUNTER — Telehealth: Payer: Self-pay | Admitting: Family Medicine

## 2017-01-25 ENCOUNTER — Telehealth: Payer: Self-pay | Admitting: *Deleted

## 2017-01-25 NOTE — Telephone Encounter (Signed)
Pt was seen at [facility] ER on Friday night. Bp was very elevated. She states that she was advised to call Dr. Earlene Plater to talk about making a change to her medication. Patients BP has been 160/80 for the last several days. She is also weak, nauseous and has diarrhea today. Please advise.

## 2017-01-25 NOTE — Telephone Encounter (Signed)
I call patient 4 times and get a fast busy signal each time.I had the on-call service people also called the patient but they also get a fast busy signal.Andrea, on Tuesday, okay to call in a prescription for prednisonePrednisone 5 mg4 pills every morning for 4 days, 3 pills every morning for 4 days, 2 pills every morning for 4 days, 1 pill every morning for 4 days, half pill every morning for 4 days, stop Disp:  42 pills

## 2017-01-25 NOTE — Telephone Encounter (Signed)
LM to return call.

## 2017-01-25 NOTE — Telephone Encounter (Signed)
Patient was seen in the emergency room for an elevated blood pressure over the weekend. [name] states the patient was taken off of the MTX and the [name] until her wound heals on her leg. Patient is having swelling and having weakness due the pain. Patient is having pain and swelling in hands, ankles, hips and knees. Requesting a prescription for prednisone.

## 2017-01-25 NOTE — Telephone Encounter (Signed)
See other message

## 2017-01-25 NOTE — Telephone Encounter (Signed)
I spoke with patient and her daughter via telephone. Patient reports that today her therapist came out to her house and found her blood pressure to be 140 over something (she couldn't recall the bottom number.) She told the therapist that she didn't want to work with her today because she was having pain and swelling in her hands, ankles, hips and knees. Patient called daughter and told her this. [name] has called rheumatology and is waiting to hear back on their recommendations. Patient reports and daughter confirms that patient isn't actually having any diarrhea presently.  When I spoke with daughter after she initially called this office, she had clarified with her mom that her main concern was the pain and swelling she was having in her joints. Of note, patient was sent to the emergency department on 01/22/2017 for evaluation of her blood pressure. Daughter wants to be sure that symptoms are not being caused by issues with patient's blood pressure, I recommended that patient be seen by PCP in office to further evaluate and treat this issue. Additionally I reviewed with daughter any concerning symptoms including: weakness, slurred speech, severe headache, chest pain, shortness of breath, lower leg swelling, and discussed with daughter that if patient has any of these symptoms that she needs to immediately take her mother to the emergency room. Daughter verbalized understanding. Daughter also was asking if patient should continue to live alone. I recommended that she follow-up with her PCP to further discuss this to come up with the safest plan for patient. I also recommended that after the daughter and I got the phone, that she go to physically visit her mother to check on her, and I reiterated with her that if she finds her mother to have any concerning symptoms to immediately take her to the ER. Daughter is in agreement to plan. She was thankful for return call. I told her our office will try to  call her tomorrow to set up an appointment for patient to discuss these issues with PCP.  Jarold Motto PA-C @TODAY @

## 2017-01-26 MED ORDER — PREDNISONE 5 MG PO TABS: ORAL_TABLET | ORAL | 0 refills | Status: DC

## 2017-01-26 NOTE — Telephone Encounter (Signed)
Prescription sent to the pharmacy and left message to notify patient.  

## 2017-01-26 NOTE — Telephone Encounter (Signed)
Scheduled patient to come in on Thursday

## 2017-01-26 NOTE — Telephone Encounter (Signed)
Called and scheduled patient to come in on Thursday.

## 2017-01-27 ENCOUNTER — Telehealth: Payer: Self-pay | Admitting: Family Medicine

## 2017-01-27 ENCOUNTER — Encounter: Payer: Self-pay | Admitting: *Deleted

## 2017-01-27 ENCOUNTER — Ambulatory Visit: Payer: Medicare Other | Admitting: Podiatry

## 2017-01-27 NOTE — Telephone Encounter (Signed)
Home Health calling to find out if Dr. Earlene Plater would like to put in orders for [name] [name] to continue to receive home health care.  Please give [name] [name] a call back to inform her of a decision.  Thank you,  -LL

## 2017-01-27 NOTE — Telephone Encounter (Signed)
This encounter was created in error - please disregard.

## 2017-01-28 ENCOUNTER — Encounter: Payer: Self-pay | Admitting: Family Medicine

## 2017-01-28 ENCOUNTER — Encounter: Payer: Self-pay | Admitting: Podiatry

## 2017-01-28 ENCOUNTER — Ambulatory Visit (INDEPENDENT_AMBULATORY_CARE_PROVIDER_SITE_OTHER): Payer: Medicare Other | Admitting: Podiatry

## 2017-01-28 ENCOUNTER — Ambulatory Visit (INDEPENDENT_AMBULATORY_CARE_PROVIDER_SITE_OTHER): Payer: Medicare Other | Admitting: Family Medicine

## 2017-01-28 VITALS — BP 168/110 | HR 91 | Temp 97.8°F | Ht 64.0 in | Wt 181.6 lb

## 2017-01-28 DIAGNOSIS — B351 Tinea unguium: Secondary | ICD-10-CM

## 2017-01-28 DIAGNOSIS — S81802D Unspecified open wound, left lower leg, subsequent encounter: Secondary | ICD-10-CM | POA: Diagnosis not present

## 2017-01-28 DIAGNOSIS — I2510 Atherosclerotic heart disease of native coronary artery without angina pectoris: Secondary | ICD-10-CM

## 2017-01-28 DIAGNOSIS — M06041 Rheumatoid arthritis without rheumatoid factor, right hand: Secondary | ICD-10-CM

## 2017-01-28 DIAGNOSIS — I10 Essential (primary) hypertension: Secondary | ICD-10-CM

## 2017-01-28 DIAGNOSIS — M79671 Pain in right foot: Secondary | ICD-10-CM | POA: Diagnosis not present

## 2017-01-28 DIAGNOSIS — M79672 Pain in left foot: Secondary | ICD-10-CM

## 2017-01-28 DIAGNOSIS — M06042 Rheumatoid arthritis without rheumatoid factor, left hand: Secondary | ICD-10-CM

## 2017-01-28 DIAGNOSIS — Z742 Need for assistance at home and no other household member able to render care: Secondary | ICD-10-CM | POA: Diagnosis not present

## 2017-01-28 NOTE — Patient Instructions (Signed)
Seen for hypertrophic nails. All nails debrided. Return in 3 months or as needed.  

## 2017-01-28 NOTE — Telephone Encounter (Signed)
Patient coming in for appointment today.  Dr. Earlene Plater will evaluate and determine need for more therapy.

## 2017-01-28 NOTE — Progress Notes (Signed)
SUBJECTIVE: 83 y.o. year old female presents requesting toe nails trimmed. Feet hurts to walk in certain shoes. Stated that her arthritis is getting worse. Rheumatoid arthritis has been affecting her for the past 4-5 years and fingers and toes are affected.  OBJECTIVE: DERMATOLOGIC EXAMINATION: Thick dystrophic and hypertrophic nails x 10. Recent history of skin biopsy right lower limb. Still has dressing covered over the shin. Still under dermatology care.  VASCULAR EXAMINATION OF LOWER LIMBS: All pedal pulses are palpable with normal pulsation.  No edema or erythema noted.  NEUROLOGIC EXAMINATION OF THE LOWER LIMBS: All epicritic and tactile sensations grossly intact.  MUSCULOSKELETAL EXAMINATION: Severe contracted 3rd digits at PIPJ and DIPJ curling into sulcus areabilateral painful with weight bearing. Palpable mass over Anterior tibialis tendon at ankle joint level left, palpable mass over base of the 4th and 5th MPJ area dorsum right foot.   Assessment: Onychomycosis x 10.  Hammer toe deformity 3rd bilateral pain with ambulation.  PLAN: All nails debrided. Return in 3 months.

## 2017-01-28 NOTE — Progress Notes (Signed)
Pre visit review using our clinic review tool, if applicable. No additional management support is needed unless otherwise documented below in the visit note. 

## 2017-01-28 NOTE — Telephone Encounter (Signed)
Spoke with Amy from [facility] and gave verbal order to continue home health for this patient.  Amy verbalized understanding.

## 2017-01-28 NOTE — Progress Notes (Signed)
[name] [name] is a 83 y.o. female is here to discuss:  History of Present Illness:   Insurance claims handler, CMA, acting as scribe for Dr. Earlene Plater.  Chief Complaint  Patient presents with  . Follow-up  . Hypertension  . Right Leg Wound   HPI:  1. Essential hypertension. Recent elevations in BP. Asymptomatic. Phone notes and ER visits reviewed. Taking medications as directed. Denies side effects. Now, also on Prednisone due to joint pain after hold Xeljanz because on LE ulcer that would not heal. Cardiovascular ROS: negative for - chest pain, irregular heartbeat, rapid heart rate or shortness of breath. Neurological ROS: negative for - behavioral changes, dizziness, gait disturbance, headaches, impaired coordination/balance, numbness/tingling, seizures, speech problems, visual changes or weakness.   2. Rheumatoid arthritis involving both hands with negative rheumatoid factor (HCC). Pain improved since taking the Prednisone.    3. Leg wound, left, subsequent encounter. Healing well over the past 1-2 weeks. Using Mupirocin and covering.    4. Need for home health care. Patient has HH care from 9-11 am. Needs assist in the pm. Having a difficult time undressing. Sometimes drops her medications. Two daughters - one in town, one in [place]. No falls.     Health Maintenance Due  Topic Date Due  . OPHTHALMOLOGY EXAM  09/12/2015  . PNA vac Low Risk Adult (2 of 2 - PCV13) 09/11/2016  . HEMOGLOBIN A1C  01/27/2017   PMHx, SurgHx, SocialHx, FamHx, Medications, and Allergies were reviewed in the Visit Navigator and updated as appropriate.   Patient Active Problem List   Diagnosis Date Noted  . Obesity (BMI 30.0-34.9) 12/08/2016  . High risk medications (not anticoagulants) long-term use 09/21/2016  . Hammer toe 10/31/2015  . Acute on chronic diastolic heart failure (HCC) 09/12/2015  . Onychomycosis 04/24/2015  . Midline low back pain without sciatica 06/27/2014  . Bilateral edema of lower  extremity 06/05/2014  . CKD (chronic kidney disease) stage 3, GFR 30-59 ml/min 06/05/2014  . DM (diabetes mellitus), type 2 with renal complications (HCC) 06/05/2014    Class: Chronic  . Anemia of chronic disease 05/01/2014  . Fluctuating blood pressure 02/06/2014  . Stroke (HCC) 01/17/2014  . Rheumatoid arthritis (HCC) 12/19/2013    Class: Chronic  . CAD (coronary artery disease) 10/18/2013  . HTN (hypertension) 08/08/2013    Class: Chronic  . Fibromyalgia 05/10/2013  . Reactive airway disease 04/19/2013  . Gout 04/19/2013  . Hyperlipidemia 04/27/2007  . Allergic rhinitis 04/27/2007  . Diverticulosis 04/27/2007   Social History  Substance Use Topics  . Smoking status: Never Smoker  . Smokeless tobacco: Never Used  . Alcohol use No   Current Medications and Allergies:   .  albuterol (PROVENTIL HFA;VENTOLIN HFA) 108 (90 Base) MCG/ACT inhaler, Inhale 1 puff into the lungs every 6 (six) hours as needed for wheezing or shortness of breath., Disp: , Rfl:  .  allopurinol (ZYLOPRIM) 100 MG tablet, Take one tablet by mouth once daily for gout, Disp: 30 tablet, Rfl: 1 .  aspirin EC 81 MG tablet, Take 81 mg by mouth daily., Disp: , Rfl:  .  calcium-vitamin D (OSCAL WITH D) 500-200 MG-UNIT per tablet, Take 1 tablet by mouth daily. , Disp: , Rfl:  .  Cholecalciferol (VITAMIN D-3) 1000 UNITS CAPS, Take 1 capsule by mouth daily., Disp: , Rfl:  .  ferrous sulfate 325 (65 FE) MG tablet, Take 325 mg by mouth daily. , Disp: , Rfl:  .  folic acid (FOLVITE) 1 MG  tablet, Take 1 tablet (1 mg total) by mouth daily., Disp: 90 tablet, Rfl: 4 .  furosemide (LASIX) 20 MG tablet, take 1 tablet by mouth once daily, Disp: 30 tablet, Rfl: 3 .  hydrALAZINE (APRESOLINE) 25 MG tablet, take 1 tablet by mouth four times a day, Disp: 120 tablet, Rfl: 6 .  labetalol (NORMODYNE) 100 MG tablet, take 2 tablet by mouth twice a day, Disp: 120 tablet, Rfl: 5 .  lisinopril (PRINIVIL,ZESTRIL) 30 MG tablet, take 1 tablet by  mouth once daily, Disp: 30 tablet, Rfl: 5 .  metFORMIN (GLUCOPHAGE) 500 MG tablet, take 1 tablet by mouth once daily for diabetes, Disp: 90 tablet, Rfl: 1 .  mupirocin ointment (BACTROBAN) 2 %, Place 1 application into the nose 2 (two) times daily. (Patient taking differently: Place 1 application into the nose 2 (two) times daily as needed (dermatitis). ), Disp: 22 g, Rfl: 0 .  predniSONE (DELTASONE) 5 MG tablet, 4 tabs po for 4 days, 3 tabs po for 4 days, 2 tabs po for 4 days, 1 tab po  for 4 days, half tab every morning for 4 days, stop, Disp: 42 tablet, Rfl: 0 .  traMADol (ULTRAM) 50 MG tablet, Take 50 mg by mouth every 6 (six) hours as needed for moderate pain., Disp: , Rfl:  .  vitamin C (ASCORBIC ACID) 500 MG tablet, Take 500 mg by mouth daily., Disp: , Rfl:   Allergies  Allergen Reactions  . Clonidine Derivatives Swelling    [name].    . Fish Allergy Swelling  . Shellfish Allergy Hives  . Indomethacin Other (See Comments)    unknown  . Lyrica [Pregabalin]     hallucinations  . Methyldopa Other (See Comments)    unknown  . Cetirizine Hcl Itching and Rash    unknown  . Codeine Itching    unknown  . Levaquin [Levofloxacin In D5w] Rash  . Tomato Rash   Review of Systems   Review of Systems  Constitutional: Negative for chills and fever.  HENT: Negative for congestion.   Respiratory: Negative for cough.   Cardiovascular: Positive for leg swelling.  Gastrointestinal: Negative for abdominal pain, nausea and vomiting.  Genitourinary: Negative for frequency.  Musculoskeletal: Positive for joint pain.  Skin: Negative for rash.  Neurological: Negative for loss of consciousness.  Psychiatric/Behavioral: Positive for memory loss. The patient is nervous/anxious.     Vitals:   Vitals:   01/28/17 1149  BP: (!) 168/110  Pulse: 91  Temp: 97.8 F (36.6 C)  TempSrc: Oral  SpO2: 96%  Weight: 181 lb 9.6 oz  (82.4 kg)  Height: 5\' 4"  (1.626 m)     Body mass index is 31.17 kg/m.   Physical Exam:    Physical Exam  Constitutional: She is oriented to person, place, and time. She appears well-developed and well-nourished.  HENT:  Head: Normocephalic and atraumatic.  Eyes: Pupils are equal, round, and reactive to light.  Cardiovascular: Normal rate, regular rhythm and normal heart sounds.   Pulmonary/Chest: Effort normal.  Abdominal: Soft.  Neurological: She is alert and oriented to person, place, and time. No cranial nerve deficit.  Skin:  Lower extremity wound healing nicely.  Psychiatric: She has a normal mood and affect. Her behavior is normal.  Nursing note and vitals reviewed.    Assessment and Plan:   Mikaella was seen today for follow-up, hypertension and right leg wound.  Diagnoses and all orders for this visit:  Essential hypertension Comments: Labile BPs at baseline. Elevated today, likely due to predisone. Aymptomatic. Continue to monitor.  Rheumatoid arthritis involving both hands with negative rheumatoid factor (HCC) Comments: Still on Prednisone. Joint pain improved. Holding [name] due to recent non-healing wound.  Leg wound, left, subsequent encounter Comments: Healing well over the past week. Rebandaged and supplied provided.  Need for home health care Comments: Prior to ordering another [facility] assesment for skilled services, will see if [facility] can offer help. Will see if due for AMW visit.   . Reviewed expectations re: course of current medical issues. . Discussed self-management of symptoms. . Outlined signs and symptoms indicating need for more acute intervention. . Patient verbalized understanding and all questions were answered. . See orders for this visit as documented in the electronic medical record. . Patient received an After Visit Summary.  CMA served as Neurosurgeon during this visit. History, Physical, and Plan performed by medical provider. Documentation and  orders reviewed and attested to. Helane Rima, D.O.  Helane Rima, D.O. [place] [place] 01/28/2017  Follow-up: 3 months.

## 2017-02-01 ENCOUNTER — Telehealth: Payer: Self-pay | Admitting: Surgical

## 2017-02-01 ENCOUNTER — Other Ambulatory Visit: Payer: Self-pay | Admitting: *Deleted

## 2017-02-01 ENCOUNTER — Telehealth: Payer: Self-pay | Admitting: *Deleted

## 2017-02-01 ENCOUNTER — Encounter: Payer: Self-pay | Admitting: *Deleted

## 2017-02-01 DIAGNOSIS — M06042 Rheumatoid arthritis without rheumatoid factor, left hand: Principal | ICD-10-CM

## 2017-02-01 DIAGNOSIS — M06041 Rheumatoid arthritis without rheumatoid factor, right hand: Secondary | ICD-10-CM

## 2017-02-01 NOTE — Telephone Encounter (Signed)
Spoke to [name] [name] and updated her that a consult for [facility] has been placed and she should be contacted within 10 days. She stated her understanding.

## 2017-02-01 NOTE — Telephone Encounter (Signed)
Notified patient that she could try the Imodium. Patient stated that she is not having any other symptoms at this time. Patient verbalized understanding.

## 2017-02-01 NOTE — Telephone Encounter (Signed)
Patients daughter called and stated that patient has had diarrhea over the weekend. She stated that it was runny over the weekend and has turned soft now. She is going multiple times during the night and day. She went 3-4 times during the night and has went 3 times since 8 AM. They are wanting to know if there is something she can take for this?

## 2017-02-01 NOTE — Telephone Encounter (Signed)
Has she tried Imodium?  Any other symptoms? Urinary? Cardiac?

## 2017-02-02 ENCOUNTER — Other Ambulatory Visit: Payer: Self-pay | Admitting: *Deleted

## 2017-02-02 NOTE — Patient Outreach (Signed)
[facility]) Care Management  02/02/2017  [name] [name] Aug 26, 1930 [id number]  CSW made an initial attempt to try and contact patient today to perform phone assessment, as well as assess and assist with social needs and services, without success.  A HIPAA complaint message was left for patient on voicemail.  CSW is currently awaiting a return call.  CSW will make a second outreach attempt in one week, if CSW does not receive a return call from patient in the meantime. [name], BSW, MSW, LCSW  Licensed Restaurant manager, [employer] [facility]  Mailing [place] [address], [place], [place] [address] Physical Address-[address], [place], [place] [address] Toll Free Main # [phone] Fax # [phone] Cell # [phone]  Office # [phone] [email or web address]

## 2017-02-08 ENCOUNTER — Other Ambulatory Visit: Payer: Self-pay | Admitting: Internal Medicine

## 2017-02-08 ENCOUNTER — Other Ambulatory Visit: Payer: Self-pay | Admitting: Rheumatology

## 2017-02-09 ENCOUNTER — Telehealth: Payer: Self-pay | Admitting: *Deleted

## 2017-02-09 ENCOUNTER — Telehealth: Payer: Self-pay | Admitting: Rheumatology

## 2017-02-09 MED ORDER — PREDNISONE 5 MG PO TABS: 5.0000 mg | ORAL_TABLET | Freq: Every day | ORAL | 0 refills | Status: DC

## 2017-02-09 NOTE — Telephone Encounter (Signed)
[name] advised prescription sent to pharmacy for prednisone. Advised once we receive clearance from dermatologist we will wean patient from prednisone and restart the Xeljanz and MTX.

## 2017-02-09 NOTE — Telephone Encounter (Signed)
[name] called on behalf of her mother. Patient states her mother has been off of her [name] and her MTX due to a wound that was not healing [name] states that her mother states the the wound has healed but has not been back to the dermatologist for clearance. Patient is having swelling and discomfort in her hands. Patient has been on a prednisone taper and took her last dose today. [name] would like to know if they should get a refill on the prednisone or how to precede from here.

## 2017-02-09 NOTE — Telephone Encounter (Signed)
Patient advised prescription she picked up is correct.

## 2017-02-09 NOTE — Telephone Encounter (Signed)
Last visit 11/28/16 Next visit 03/04/17 Labs 12/10/16 show creat 1.13  Okay to refill Allopurinol?

## 2017-02-09 NOTE — Telephone Encounter (Signed)
[name] called and she has some questions in regards to the medication they just picked up from the pharmacy.  CB#[phone].  Thank you.

## 2017-02-09 NOTE — Telephone Encounter (Signed)
Call in prednisone 5 mg by mouth daily #30. Patient to get clearance from the dermatologist to start medications. Will taper prednisone once she starts her salt Barbette Merino methotrexate

## 2017-02-11 ENCOUNTER — Other Ambulatory Visit: Payer: Self-pay | Admitting: *Deleted

## 2017-02-11 NOTE — Patient Outreach (Signed)
[facility]) Care Management  02/11/2017  [name] [name] 1930/07/12 [id number]   CSW made a second attempt to try and contact patient today to perform phone assessment, as well as assess and assist with social needs and services, without success.  CSW was unable to leave  a HIPAA complaint message for patient on voicemail, as patient's phone just continued to ring.  CSW will make a third and final outreach attempt in one week.   [name], BSW, MSW, LCSW  Licensed Restaurant manager, [employer] food [facility]  Mailing [place] [address] [place], [place] [address] Physical Address-[address], [place], [place] [address] Toll Free Main # [phone] Fax # [phone] Cell # [phone]  Office # [phone] [email or web address]

## 2017-02-13 ENCOUNTER — Other Ambulatory Visit: Payer: Self-pay | Admitting: Internal Medicine

## 2017-02-17 ENCOUNTER — Telehealth: Payer: Self-pay | Admitting: Rheumatology

## 2017-02-17 NOTE — Telephone Encounter (Signed)
[name] left a message this morning stating that her mother is having some pain and inflammation.  She would like someone to call her.  Daughter's CB#[phone].  Thank you.

## 2017-02-17 NOTE — Telephone Encounter (Signed)
[name] states [name] [name] is still having pain and swelling even after starting the Prednisone 5 mg on 02/09/17. [name] states that Ms. Faughnan increased her Prednisone to 10 mg on 02/16/17 and 02/17/17. She would like to know if she should continue her mother on that dose as she is getting a little more relief on the Prednisone 10 mg dose. Patient has been scheduled to be seen tomorrow 02/18/17.

## 2017-02-18 ENCOUNTER — Ambulatory Visit (INDEPENDENT_AMBULATORY_CARE_PROVIDER_SITE_OTHER): Payer: Medicare Other | Admitting: Rheumatology

## 2017-02-18 ENCOUNTER — Other Ambulatory Visit: Payer: Self-pay | Admitting: *Deleted

## 2017-02-18 ENCOUNTER — Encounter: Payer: Self-pay | Admitting: Rheumatology

## 2017-02-18 ENCOUNTER — Encounter: Payer: Self-pay | Admitting: *Deleted

## 2017-02-18 VITALS — BP 146/80 | HR 88 | Resp 14 | Wt 178.0 lb

## 2017-02-18 DIAGNOSIS — Z79899 Other long term (current) drug therapy: Secondary | ICD-10-CM

## 2017-02-18 DIAGNOSIS — Z8679 Personal history of other diseases of the circulatory system: Secondary | ICD-10-CM | POA: Diagnosis not present

## 2017-02-18 DIAGNOSIS — Z8719 Personal history of other diseases of the digestive system: Secondary | ICD-10-CM | POA: Diagnosis not present

## 2017-02-18 DIAGNOSIS — Z9889 Other specified postprocedural states: Secondary | ICD-10-CM

## 2017-02-18 DIAGNOSIS — Z8639 Personal history of other endocrine, nutritional and metabolic disease: Secondary | ICD-10-CM

## 2017-02-18 DIAGNOSIS — N183 Chronic kidney disease, stage 3 unspecified: Secondary | ICD-10-CM

## 2017-02-18 DIAGNOSIS — Z8673 Personal history of transient ischemic attack (TIA), and cerebral infarction without residual deficits: Secondary | ICD-10-CM | POA: Diagnosis not present

## 2017-02-18 DIAGNOSIS — I2510 Atherosclerotic heart disease of native coronary artery without angina pectoris: Secondary | ICD-10-CM | POA: Diagnosis not present

## 2017-02-18 DIAGNOSIS — M797 Fibromyalgia: Secondary | ICD-10-CM | POA: Diagnosis not present

## 2017-02-18 DIAGNOSIS — M1A09X0 Idiopathic chronic gout, multiple sites, without tophus (tophi): Secondary | ICD-10-CM | POA: Diagnosis not present

## 2017-02-18 DIAGNOSIS — M0609 Rheumatoid arthritis without rheumatoid factor, multiple sites: Secondary | ICD-10-CM | POA: Diagnosis not present

## 2017-02-18 DIAGNOSIS — Z96653 Presence of artificial knee joint, bilateral: Secondary | ICD-10-CM | POA: Diagnosis not present

## 2017-02-18 MED ORDER — METHOTREXATE 2.5 MG PO TABS: 10.0000 mg | ORAL_TABLET | ORAL | 1 refills | Status: DC

## 2017-02-18 NOTE — Patient Outreach (Signed)
[facility]) Care Management  02/18/2017  [name] [name] Apr 12, 1930 [id number]   CSW made a third and final attempt to try and contact patient today to perform phone assessment, as well as assess and assist with social work needs and services, without success.  CSW was unable to leave a HIPAA compliant message for patient, as patient's phone just continued to ring and ring.  CSW continues to await a return call.  CSW will mail an outreach letter to patient's home, encouraging patient to contact CSW at their earliest convenience, if patient is interested in receiving social work services through CSW with [facility].  If CSW does not receive a return call from patient within the next 10 business days, CSW will proceed with case closure.  Required number of phone attempts will have been made and outreach letter mailed.   [name], BSW, MSW, LCSW  Licensed Restaurant manager, [employer] food [facility]  Mailing [place] [address], [place], [place] [address] Physical Address-[address], [place], [place] [address] Toll Free Main # [phone] Fax # [phone] Cell # [phone]  Office # [phone] [email or web address]

## 2017-02-18 NOTE — Progress Notes (Signed)
Office Visit Note  Patient: [name] [name]             Date of Birth: [birth year]           MRN: [id number]             PCP: Helane Rima, DO Referring: Helane Rima, DO Visit Date: 02/18/2017 Occupation: @GUAROCC @    Subjective:  Follow-up and Joint Pain (having severe pain on pred 10mg  now off Xeljanzj/ Methotrexate until cleared by dermatology end of the month )   History of Present Illness: [name] [name] is a 83 y.o. female with history of sero positive rheumatoid arthritis and gout. She's been off Xeljanz and methotrexate due to a lesion she developed on her right lower extremity. She had that biopsied by her dermatologist Dr. [name]. She's been having increased pain and swelling in all of her joints. She had to increase her prednisone from 5 to10 mg to control her symptoms. She's very stiff in the morning and she's been having swelling in her both hands. All of her joints are painful currently. She states the skin lesions healed quite well. She did not have to take any antibiotics she only uses some topical agents.  Activities of Daily Living:  Patient reports morning stiffness for 4-5 hours.   Patient Reports nocturnal pain.  Difficulty dressing/grooming: Denies Difficulty climbing stairs: Reports Difficulty getting out of chair: Reports Difficulty using hands for taps, buttons, cutlery, and/or writing: Reports   Review of Systems  Constitutional: Positive for fatigue. Negative for night sweats, weight gain, weight loss and weakness.  HENT: Negative for mouth sores, trouble swallowing, trouble swallowing, mouth dryness and nose dryness.   Eyes: Negative for pain, redness, visual disturbance and dryness.  Respiratory: Negative for cough, shortness of breath and difficulty breathing.   Cardiovascular: Negative for chest pain, palpitations, hypertension, irregular heartbeat and swelling in legs/feet.  Gastrointestinal: Negative for blood in stool, constipation and  diarrhea.  Endocrine: Negative for increased urination.  Genitourinary: Negative for vaginal dryness.  Musculoskeletal: Positive for arthralgias, joint pain, joint swelling, myalgias, morning stiffness and myalgias. Negative for muscle weakness and muscle tenderness.  Skin: Negative for color change, rash, hair loss, skin tightness, ulcers and sensitivity to sunlight.  Allergic/Immunologic: Negative for susceptible to infections.  Neurological: Negative for dizziness, memory loss and night sweats.  Hematological: Negative for swollen glands.  Psychiatric/Behavioral: Positive for sleep disturbance. Negative for depressed mood. The patient is not nervous/anxious.     PMFS History:  Patient Active Problem List   Diagnosis Date Noted  . History of CHF (congestive heart failure) 02/18/2017  . History of diabetes mellitus 02/18/2017  . History of total knee arthroplasty, bilateral 02/18/2017  . History of rotator cuff surgery 02/18/2017  . Obesity (BMI 30.0-34.9) 12/08/2016  . High risk medications (not anticoagulants) long-term use 09/21/2016  . Hammer toe 10/31/2015  . Acute on chronic diastolic heart failure (HCC) 09/12/2015  . Onychomycosis 04/24/2015  . Midline low back pain without sciatica 06/27/2014  . Bilateral edema of lower extremity 06/05/2014  . CKD (chronic kidney disease) stage 3, GFR 30-59 ml/min 06/05/2014  . DM (diabetes mellitus), type 2 with renal complications (HCC) 06/05/2014    Class: Chronic  . Anemia of chronic disease 05/01/2014  . Fluctuating blood pressure 02/06/2014  . Stroke (HCC) 01/17/2014  . Rheumatoid arthritis (HCC) 12/19/2013    Class: Chronic  . CAD (coronary artery disease) 10/18/2013  . HTN (hypertension) 08/08/2013    Class: Chronic  .  Fibromyalgia 05/10/2013  . Reactive airway disease 04/19/2013  . Gout 04/19/2013  . Hyperlipidemia 04/27/2007  . Allergic rhinitis 04/27/2007  . Diverticulosis 04/27/2007    Past Medical History:  Diagnosis  Date  . Allergic rhinitis due to pollen 04/27/2007  . Arthritis    "in q joint" (08/07/2013)  . Coronary atherosclerosis of native coronary artery   . Cough   . Disorder of bone and cartilage, unspecified   . Edema 05/03/2013  . Exertional shortness of breath    "sometimes" (08/07/2013)  . First degree atrioventricular block   . Gout, unspecified 04/19/2013  . Hyperlipidemia 04/27/2007  . Hypertension   . Muscle spasm   . Muscle weakness (generalized)   . Myocardial infarction ~ 1970  . Onychia and paronychia of toe   . Osteoarthrosis, unspecified whether generalized or localized, unspecified site 04/27/2007  . Other fall   . Other malaise and fatigue   . Prepatellar bursitis   . Seizures (HCC)   . Stroke (HCC) 01/17/2014  . TIA (transient ischemic attack)    "a few one summer" (08/07/2013)  . Type II diabetes mellitus (HCC)    "fasting 90-110s" (08/07/2013)  . Unspecified essential hypertension 08/08/2013  . Unspecified vitamin D deficiency     Family History  Problem Relation Age of Onset  . Heart attack Father    Past Surgical History:  Procedure Laterality Date  . ABDOMINAL HYSTERECTOMY  04/1980  . APPENDECTOMY  1960  . CARDIAC CATHETERIZATION  2003  . CATARACT EXTRACTION W/ INTRAOCULAR LENS  IMPLANT, BILATERAL Bilateral ~ 2012  . JOINT REPLACEMENT    . REPLACEMENT TOTAL KNEE Right 09/2005  . SHOULDER OPEN ROTATOR CUFF REPAIR Right 07/1999  . TONSILLECTOMY  08/1974  . TOTAL KNEE ARTHROPLASTY Left 08/07/2013  . TOTAL KNEE ARTHROPLASTY Left 08/07/2013   Procedure: TOTAL KNEE ARTHROPLASTY- LEFT;  Surgeon: Dannielle Huh, MD;  Location: [facility] OR;  Service: Orthopedics;  Laterality: Left;  . TRANSTHORACIC ECHOCARDIOGRAM  2003   EF 55-65%; mild concentric LVH   Social History   Social History Narrative   Widowed   Research officer, political party with cane     Objective: Vital Signs: BP (!) 146/80   Pulse 88   Resp 14   Wt 178 lb (80.7 kg)   BMI 30.55 kg/m    Physical Exam  Constitutional:  She is oriented to person, place, and time. She appears well-developed and well-nourished.  HENT:  Head: Normocephalic and atraumatic.  Eyes: Conjunctivae and EOM are normal.  Neck: Normal range of motion.  Cardiovascular: Normal rate, regular rhythm, normal heart sounds and intact distal pulses.   Pulmonary/Chest: Effort normal and breath sounds normal.  Abdominal: Soft. Bowel sounds are normal.  Lymphadenopathy:    She has no cervical adenopathy.  Neurological: She is alert and oriented to person, place, and time.  Skin: Skin is warm and dry. Capillary refill takes less than 2 seconds.  Hyperpigmented 4 nodules noted on right lower extremity. One nodule noted on left lower extremity  Psychiatric: She has a normal mood and affect. Her behavior is normal.  Nursing note and vitals reviewed.    Musculoskeletal Exam: C-spine and thoracic spine good range of motion she has limited range of motion of her lumbar spine. Her shoulder joint abduction was limited to 30 bilaterally. Elbow joints are good range of motion. She has severe synovitis and decreased range of motion in her bilateral wrist joints  CDAI Exam: CDAI Homunculus Exam:   Tenderness:  RUE: glenohumeral and wrist  LUE: glenohumeral and wrist Right hand: 1st MCP, 2nd MCP, 3rd MCP, 4th MCP, 5th MCP, 2nd PIP and 3rd PIP Left hand: 1st MCP, 2nd MCP, 3rd MCP, 4th MCP, 5th MCP, 2nd PIP and 3rd PIP RLE: tibiofemoral and tibiotalar LLE: tibiofemoral and tibiotalar  Swelling:  RUE: wrist LUE: wrist Right hand: 2nd MCP, 3rd MCP, 4th MCP and 2nd PIP Left hand: 2nd MCP, 3rd MCP and 5th MCP  Joint Counts:  CDAI Tender Joint count: 20 CDAI Swollen Joint count: 9  Global Assessments:  Patient Global Assessment: 6 Provider Global Assessment: 8  CDAI Calculated Score: 43    Investigation: No additional findings.   Imaging: No results found.  Speciality Comments: No specialty comments available.    Procedures:  No  procedures performed Allergies: Clonidine derivatives; Fish allergy; Shellfish allergy; Indomethacin; Lyrica [pregabalin]; Methyldopa; Cetirizine hcl; Codeine; Levaquin [levofloxacin in d5w]; and Tomato   Assessment / Plan:     Visit Diagnoses: Rheumatoid arthritis of multiple sites with negative rheumatoid factor (HCC) - +CCP . She is having a severe flare with increased swelling and pain in multiple joints. I did notice some nodules on her lower extremities which appear to be rheumatoid nodules and none of them are actively infected currently. I also see old Dr. Jorja Loa and had a phone conversation with him. He states that her biopsy was about 5-6 months ago which was consistent with rheumatoid nodule. We do not see any contraindication to restart her methotrexate and Xeljanz. She is an appointment with him towards the end of the month. He stated in case her symptoms get worse she should make an earlier appointment. I discussed the above with patient and her daughter today.  High risk medications (not anticoagulants) long-term use: She is on prednisone 10 mg. She will restart her methotrexate and [name] this week. She supposed to have her labs as a scheduled.  Fibromyalgia: She has some generalized pain from fibromyalgia  Idiopathic chronic gout of multiple sites without tophus: She's been taking allopurinol on regular basis.  CKD (chronic kidney disease) stage 3, GFR 30-59 ml/min  History of CHF (congestive heart failure)  History of diabetes mellitus  History of total knee arthroplasty, bilateral  History of rotator cuff surgery  History of CVA (cerebrovascular accident)  History of diverticulosis    Orders: No orders of the defined types were placed in this encounter.  Meds ordered this encounter  Medications  . methotrexate (RHEUMATREX) 2.5 MG tablet    Sig: Take 4 tablets (10 mg total) by mouth once a week. Caution:Chemotherapy. Protect from light.    Dispense:  16 tablet     Refill:  1    Face-to-face time spent with patient was 30 minutes. 50% of time was spent in counseling and coordination of care.  Follow-Up Instructions: Return for Rheumatoid arthritis.   Pollyann Savoy, MD  Note - This record has been created using Animal nutritionist.  Chart creation errors have been sought, but may not always  have been located. Such creation errors do not reflect on  the standard of medical care.

## 2017-03-01 ENCOUNTER — Encounter: Payer: Self-pay | Admitting: Rheumatology

## 2017-03-01 NOTE — Progress Notes (Signed)
[name], Para March, call me this weekend because her mom [name] [name] was flaring.  She was off of her rheumatoid arthritis medication for some time and by the timeshe restarted her medication,her joints were painful.  She flared to the point where they needed pain control.  ; Prednisone taper for the patient.  We called and 4 pills for 4 days, 3 pills for 4 days, 2 pills for 4 days, 1 pill for 4 days, half pill for 4 days. I called today to check in on the patientand Para March states the patient is doing very well and she is pain-free. She started the medication on Sunday morning as prescribed.  Patient has a follow-up appointment on 03/04/2017. I've asked him to keep that appointment

## 2017-03-03 ENCOUNTER — Telehealth: Payer: Self-pay

## 2017-03-03 NOTE — Telephone Encounter (Signed)
Noted patient's last refill of [name] was on 01/04/17 at [facility]. I called patient with a refill reminder call.  Patient confirms she does need a refill at this time. She has about one week of medication left.  Will have the outpatient pharmacy process patient's prescription at this time.    Brynne Doane, [place], CPhT 11:13 AM

## 2017-03-04 ENCOUNTER — Other Ambulatory Visit: Payer: Self-pay | Admitting: *Deleted

## 2017-03-04 ENCOUNTER — Ambulatory Visit (INDEPENDENT_AMBULATORY_CARE_PROVIDER_SITE_OTHER): Payer: Medicare Other | Admitting: Rheumatology

## 2017-03-04 ENCOUNTER — Encounter: Payer: Self-pay | Admitting: Rheumatology

## 2017-03-04 ENCOUNTER — Encounter: Payer: Self-pay | Admitting: *Deleted

## 2017-03-04 VITALS — BP 183/85 | HR 85 | Resp 17 | Ht 64.0 in | Wt 177.0 lb

## 2017-03-04 DIAGNOSIS — M797 Fibromyalgia: Secondary | ICD-10-CM

## 2017-03-04 DIAGNOSIS — Z79899 Other long term (current) drug therapy: Secondary | ICD-10-CM | POA: Diagnosis not present

## 2017-03-04 DIAGNOSIS — I2510 Atherosclerotic heart disease of native coronary artery without angina pectoris: Secondary | ICD-10-CM | POA: Diagnosis not present

## 2017-03-04 DIAGNOSIS — M0609 Rheumatoid arthritis without rheumatoid factor, multiple sites: Secondary | ICD-10-CM

## 2017-03-04 MED FILL — XELJANZ 5 MG TABS: 5 | 30 days supply | Qty: 30 | Fill #2

## 2017-03-04 NOTE — Patient Outreach (Signed)
[facility]) Care Management  03/04/2017  [name] [name] [birth year] [id number]   CSW will perform a case closure on patient, due to inability to establish initial phone contact, despite required number of phone attempts made and outreach letter mailed to patient's home.  CSW will fax an update to patient's Primary Care Physician, Dr. Helane Rima to ensure that they are aware of CSW's involvement with patient's plan of care.  CSW will submit a case closure request to [name], Care Management Assistant with [facility] Care Management, in the form of an In [employer].   [name], BSW, MSW, LCSW  Licensed Restaurant manager, [employer] [facility]  Mailing [place] [address] [place], [place] [address] Physical Address-[address], [place], [place] [address] Toll Free Main # [phone] Fax # [phone] Cell # [phone]  Office # [phone] Mardene [email or web address]

## 2017-03-04 NOTE — Patient Instructions (Signed)
   Mrs [name] [name], Please return to clinic in about 7 weeks from today. He can, few days earlier few days later.  At that visit approximately June 09/20/12/14, 2018: We plan to do CBC with differential and CMP with GFR and uric acid.  In the meanwhile let's do the medication as follows  Finished a prednisone taper as prescribed except when you get to 5 mg of prednisone daily, do that for 4 days and then restart your daily 5 mg prednisone that she already half  #2: continue Xeljanz 5 mg twice a day  #3:  continue methotrexate 4 pills every week  #4:  continue folic acid 1 mg every day  #5:  continue allopurinol 100 mg every day

## 2017-03-04 NOTE — Progress Notes (Signed)
Rheumatology Medication Review by a Pharmacist Does the patient feel that his/her medications are working for him/her?  Yes Has the patient been experiencing any side effects to the medications prescribed?  No Does the patient have any problems obtaining medications?  No  Issues to address at subsequent visits: None   Pharmacist comments:  [name] [name] [name] is a pleasant 83 yo F who presents for follow up of her rheumatoid arthritis.  She is currently taking Xeljanz 5 mg daily, prednisone taper (prescribed 02/28/17), methotrexate 10 mg weekly, and folic acid 1 mg daily.  Most recent standing labs were in March 2018.  She also had lipid panel at that time.  Most recent TB Gold was negative on 07/03/16.  Patient denies any questions or concerns regarding her medications.  Discussed prednisone taper with [name] [name] [name].  Patient was started on daily prednisone (5mg  daily) while she was off .  She has now restarted [place] and is taper prednisone as instructed.  Patient was advised to continue tapering prednisone until taper is completed. She is to [name] [name] [name] [name] [name] if she develops any flare when she finishes taper.  Patient voiced understanding.    [place], Pharm.D., BCPS, CPP Clinical Pharmacist Pager: [phone] Phone: [phone] 03/04/2017 3:29 PM

## 2017-03-04 NOTE — Progress Notes (Signed)
Office Visit Note  Patient: [name] [name]             Date of Birth: [birth year]           MRN: [id number]             PCP: Briscoe Deutscher, DO Referring: Gildardo Cranker, DO Visit Date: 03/04/2017 Occupation: _0 @    Subjective:  Follow-up   History of Present Illness: [name] [name] is a 83 y.o. female   Last seen 02/18/2017. Patient had to hold off on her rheumatoid arthritis treatment because she had a biopsy done through dermatologist office. By the time she restarted her medication, she was flaring some.  She is currently on Xeljanz 5 mg twice a day Methotrexate 4 pills every week Prednisone 5 mg daily  02/27/2017, she was flaring and I gave her prednisone taper.  Today she is doing much better except she does have some swelling to her wrist joints but tolerating it quite well and significantly improved with the prednisone taper. She will finish the taper as prescribed.  She is accompanied by her granddaughter, Retail buyer.  Activities of Daily Living:  Patient reports morning stiffness for 30 minutes.   Patient Denies nocturnal pain.  Difficulty dressing/grooming: Reports Difficulty climbing stairs: Reports Difficulty getting out of chair: Reports Difficulty using hands for taps, buttons, cutlery, and/or writing: Reports   Review of Systems  Constitutional: Negative for fatigue.  HENT: Negative for mouth sores and mouth dryness.   Eyes: Negative for dryness.  Respiratory: Negative for shortness of breath.   Gastrointestinal: Negative for constipation and diarrhea.  Musculoskeletal: Negative for myalgias and myalgias.  Skin: Negative for sensitivity to sunlight.  Psychiatric/Behavioral: Negative for decreased concentration and sleep disturbance.    PMFS History:  Patient Active Problem List   Diagnosis Date Noted  . History of CHF (congestive heart failure) 02/18/2017  . History of diabetes mellitus 02/18/2017  . History of total knee  arthroplasty, bilateral 02/18/2017  . History of rotator cuff surgery 02/18/2017  . Obesity (BMI 30.0-34.9) 12/08/2016  . High risk medications (not anticoagulants) long-term use 09/21/2016  . Hammer toe 10/31/2015  . Acute on chronic diastolic heart failure ([place]) 09/12/2015  . Onychomycosis 04/24/2015  . Midline low back pain without sciatica 06/27/2014  . Bilateral edema of lower extremity 06/05/2014  . CKD (chronic kidney disease) stage 3, GFR 30-59 ml/min 06/05/2014  . DM (diabetes mellitus), type 2 with renal complications ([place]) 63/81/7711    Class: Chronic  . Anemia of chronic disease 05/01/2014  . Fluctuating blood pressure 02/06/2014  . Stroke ([place]) 01/17/2014  . Rheumatoid arthritis ([place]) 12/19/2013    Class: Chronic  . CAD (coronary artery disease) 10/18/2013  . HTN (hypertension) 08/08/2013    Class: Chronic  . Fibromyalgia 05/10/2013  . Reactive airway disease 04/19/2013  . Gout 04/19/2013  . Hyperlipidemia 04/27/2007  . Allergic rhinitis 04/27/2007  . Diverticulosis 04/27/2007    Past Medical History:  Diagnosis Date  . Allergic rhinitis due to pollen 04/27/2007  . Arthritis    "in q joint" (08/07/2013)  . Coronary atherosclerosis of native coronary artery   . Cough   . Disorder of bone and cartilage, unspecified   . Edema 05/03/2013  . Exertional shortness of breath    "sometimes" (08/07/2013)  . First degree atrioventricular block   . Gout, unspecified 04/19/2013  . Hyperlipidemia 04/27/2007  . Hypertension   . Muscle spasm   . Muscle weakness (generalized)   . Myocardial  infarction ([place]) ~ 1970  . Onychia and paronychia of toe   . Osteoarthrosis, unspecified whether generalized or localized, unspecified site 04/27/2007  . Other fall   . Other malaise and fatigue   . Prepatellar bursitis   . Seizures ([place])   . Stroke ([place]) 01/17/2014  . TIA (transient ischemic attack)    "a few one summer" (08/07/2013)  . Type II diabetes mellitus ([place])    "fasting  90-110s" (08/07/2013)  . Unspecified essential hypertension 08/08/2013  . Unspecified vitamin D deficiency     Family History  Problem Relation Age of Onset  . Heart attack Father    Past Surgical History:  Procedure Laterality Date  . ABDOMINAL HYSTERECTOMY  04/1980  . APPENDECTOMY  1960  . CARDIAC CATHETERIZATION  2003  . CATARACT EXTRACTION W/ INTRAOCULAR LENS  IMPLANT, BILATERAL Bilateral ~ 2012  . JOINT REPLACEMENT    . REPLACEMENT TOTAL KNEE Right 09/2005  . SHOULDER OPEN ROTATOR CUFF REPAIR Right 07/1999  . TONSILLECTOMY  08/1974  . TOTAL KNEE ARTHROPLASTY Left 08/07/2013  . TOTAL KNEE ARTHROPLASTY Left 08/07/2013   Procedure: TOTAL KNEE ARTHROPLASTY- LEFT;  Surgeon: Vickey Huger, MD;  Location: [facility];  Service: Orthopedics;  Laterality: Left;  . TRANSTHORACIC ECHOCARDIOGRAM  2003   EF 55-65%; mild concentric LVH   Social History   Social History Narrative   Widowed   Psychologist, counselling with cane     Objective: Vital Signs: BP (!) 183/85 (BP Location: Left Arm, Patient Position: Sitting, Cuff Size: Normal)   Pulse 85   Resp 17   Ht 5' 4" (1.626 m)   Wt 177 lb (80.3 kg)   BMI 30.38 kg/m    Physical Exam  Constitutional: She is oriented to person, place, and time. She appears well-developed and well-nourished.  HENT:  Head: Normocephalic and atraumatic.  Eyes: EOM are normal. Pupils are equal, round, and reactive to light.  Cardiovascular: Normal rate, regular rhythm and normal heart sounds.  Exam reveals no gallop and no friction rub.   No murmur heard. Pulmonary/Chest: Effort normal and breath sounds normal. She has no wheezes. She has no rales.  Abdominal: Soft. Bowel sounds are normal. She exhibits no distension. There is no tenderness. There is no guarding. No hernia.  Musculoskeletal: Normal range of motion. She exhibits no edema, tenderness or deformity.  Lymphadenopathy:    She has no cervical adenopathy.  Neurological: She is alert and oriented to person, place, and  time. Coordination normal.  Skin: Skin is warm and dry. Capillary refill takes less than 2 seconds. No rash noted.  Psychiatric: She has a normal mood and affect. Her behavior is normal.  Nursing note and vitals reviewed.    Musculoskeletal Exam:  Full range of motion of all joints except shoulder joints have about 20-30 of abduction. Grip strength is full ist formation)   CDAI Exam: CDAI Homunculus Exam:   Tenderness:  RUE: wrist LUE: wrist Right hand: 4th MCP  Swelling:  RUE: wrist LUE: wrist Right hand: 4th MCP  Joint Counts:  CDAI Tender Joint count: 3 CDAI Swollen Joint count: 3  Global Assessments:  Patient Global Assessment: 6 Provider Global Assessment: 6  CDAI Calculated Score: 18  Patient has significant swelling to the right wrist on the dorsal aspect close to her thumb Right fourth MCP with synovitis Left wrist with dorsal aspect with synovitis but not as bad as the right   Investigation: No additional findings.  Admission on 01/22/2017, Discharged on 01/23/2017  Component  Date Value Ref Range Status  . Sodium 01/22/2017 139  135 - 145 mmol/L Final  . Potassium 01/22/2017 3.5  3.5 - 5.1 mmol/L Final  . Chloride 01/22/2017 103  101 - 111 mmol/L Final  . BUN 01/22/2017 18  6 - 20 mg/dL Final  . Creatinine, Ser 01/22/2017 0.90  0.44 - 1.00 mg/dL Final  . Glucose, Bld 01/22/2017 95  65 - 99 mg/dL Final  . Calcium, Ion 01/22/2017 1.20  1.15 - 1.40 mmol/L Final  . TCO2 01/22/2017 25  0 - 100 mmol/L Final  . Hemoglobin 01/22/2017 10.9* 12.0 - 15.0 g/dL Final  . HCT 01/22/2017 32.0* 36.0 - 46.0 % Final  Orders Only on 01/21/2017  Component Date Value Ref Range Status  . Total Bilirubin 01/21/2017 0.4  0.2 - 1.2 mg/dL Final  . Bilirubin, Direct 01/21/2017 0.1  <=0.2 mg/dL Final  . Indirect Bilirubin 01/21/2017 0.3  0.2 - 1.2 mg/dL Final  . Alkaline Phosphatase 01/21/2017 56  33 - 130 U/L Final  . AST 01/21/2017 20  10 - 35 U/L Final  . ALT 01/21/2017 8   6 - 29 U/L Final  . Total Protein 01/21/2017 6.4  6.1 - 8.1 g/dL Final  . Albumin 01/21/2017 3.7  3.6 - 5.1 g/dL Final  . Cholesterol 01/21/2017 198  <200 mg/dL Final  . Triglycerides 01/21/2017 65  <150 mg/dL Final  . HDL 01/21/2017 77  >50 mg/dL Final  . Total CHOL/HDL Ratio 01/21/2017 2.6  <5.0 Ratio Final  . VLDL 01/21/2017 13  <30 mg/dL Final  . LDL Cholesterol 01/21/2017 108* <100 mg/dL Final  Office Visit on 01/19/2017  Component Date Value Ref Range Status  . Gram Stain 01/19/2017 No WBC Seen   Final  . Gram Stain 01/19/2017 No Squamous Epithelial Cells Seen   Final  . Gram Stain 01/19/2017 No Organisms Seen   Final  . Organism ID, Bacteria 01/19/2017 NO GROWTH 2 DAYS   Final  Office Visit on 01/13/2017  Component Date Value Ref Range Status  . Color, UA 01/13/2017 Yellow   Final  . Clarity, UA 01/13/2017 Slightly Cloudy   Final  . Glucose, UA 01/13/2017 Negative   Final  . Bilirubin, UA 01/13/2017 Negative   Final  . Ketones, UA 01/13/2017 Negative   Final  . Spec Grav, UA 01/13/2017 1.015   Final  . Blood, UA 01/13/2017 Negative   Final  . pH, UA 01/13/2017 6.0   Final  . Protein, UA 01/13/2017 Negative   Final  . Urobilinogen, UA 01/13/2017 0.2   Final  . Nitrite, UA 01/13/2017 Negative   Final  . Leukocytes, UA 01/13/2017 Negative  Negative Final  . Organism ID, Bacteria 01/13/2017    Final                   Value:Three or more organisms present,each greater than 10,000 CFU/mL.These organisms,commonly found on external and internal genitalia,are considered to be colonizers.No further testing performed.   . WBC 01/13/2017 6.3  4.0 - 10.5 K/uL Final  . RBC 01/13/2017 3.49* 3.87 - 5.11 Mil/uL Final  . Hemoglobin 01/13/2017 11.2* 12.0 - 15.0 g/dL Final  . HCT 01/13/2017 33.9* 36.0 - 46.0 % Final  . MCV 01/13/2017 97.1  78.0 - 100.0 fl Final  . MCHC 01/13/2017 33.2  30.0 - 36.0 g/dL Final  . RDW 01/13/2017 18.1* 11.5 - 15.5 % Final  . Platelets 01/13/2017 325.0   150.0 - 400.0 K/uL Final  . Neutrophils Relative %  01/13/2017 52.0  43.0 - 77.0 % Final  . Lymphocytes Relative 01/13/2017 37.7  12.0 - 46.0 % Final  . Monocytes Relative 01/13/2017 6.7  3.0 - 12.0 % Final  . Eosinophils Relative 01/13/2017 2.9  0.0 - 5.0 % Final  . Basophils Relative 01/13/2017 0.7  0.0 - 3.0 % Final  . Neutro Abs 01/13/2017 3.3  1.4 - 7.7 K/uL Final  . Lymphs Abs 01/13/2017 2.4  0.7 - 4.0 K/uL Final  . Monocytes Absolute 01/13/2017 0.4  0.1 - 1.0 K/uL Final  . Eosinophils Absolute 01/13/2017 0.2  0.0 - 0.7 K/uL Final  . Basophils Absolute 01/13/2017 0.0  0.0 - 0.1 K/uL Final  Admission on 01/12/2017, Discharged on 01/12/2017  Component Date Value Ref Range Status  . Sodium 01/12/2017 138  135 - 145 mmol/L Final  . Potassium 01/12/2017 3.8  3.5 - 5.1 mmol/L Final  . Chloride 01/12/2017 106  101 - 111 mmol/L Final  . CO2 01/12/2017 24  22 - 32 mmol/L Final  . Glucose, Bld 01/12/2017 128* 65 - 99 mg/dL Final  . BUN 01/12/2017 20  6 - 20 mg/dL Final  . Creatinine, Ser 01/12/2017 0.99  0.44 - 1.00 mg/dL Final  . Calcium 01/12/2017 9.2  8.9 - 10.3 mg/dL Final  . GFR calc non Af Amer 01/12/2017 51* >60 mL/min Final  . GFR calc Af Amer 01/12/2017 59* >60 mL/min Final   Comment: (NOTE) The eGFR has been calculated using the CKD EPI equation. This calculation has not been validated in all clinical situations. eGFR's persistently <60 mL/min signify possible Chronic Kidney Disease.   . Anion gap 01/12/2017 8  5 - 15 Final  . WBC 01/12/2017 5.8  4.0 - 10.5 K/uL Final  . RBC 01/12/2017 3.38* 3.87 - 5.11 MIL/uL Final  . Hemoglobin 01/12/2017 10.6* 12.0 - 15.0 g/dL Final  . HCT 01/12/2017 32.1* 36.0 - 46.0 % Final  . MCV 01/12/2017 95.0  78.0 - 100.0 fL Final  . MCH 01/12/2017 31.4  26.0 - 34.0 pg Final  . MCHC 01/12/2017 33.0  30.0 - 36.0 g/dL Final  . RDW 01/12/2017 17.3* 11.5 - 15.5 % Final  . Platelets 01/12/2017 258  150 - 400 K/uL Final  . Color, Urine 01/12/2017  YELLOW  YELLOW Final  . APPearance 01/12/2017 CLEAR  CLEAR Final  . Specific Gravity, Urine 01/12/2017 1.011  1.005 - 1.030 Final  . pH 01/12/2017 6.0  5.0 - 8.0 Final  . Glucose, UA 01/12/2017 NEGATIVE  NEGATIVE mg/dL Final  . Hgb urine dipstick 01/12/2017 NEGATIVE  NEGATIVE Final  . Bilirubin Urine 01/12/2017 NEGATIVE  NEGATIVE Final  . Ketones, ur 01/12/2017 NEGATIVE  NEGATIVE mg/dL Final  . Protein, ur 01/12/2017 NEGATIVE  NEGATIVE mg/dL Final  . Nitrite 01/12/2017 NEGATIVE  NEGATIVE Final  . Leukocytes, UA 01/12/2017 TRACE* NEGATIVE Final  . RBC / HPF 01/12/2017 0-5  0 - 5 RBC/hpf Final  . WBC, UA 01/12/2017 0-5  0 - 5 WBC/hpf Final  . Bacteria, UA 01/12/2017 NONE SEEN  NONE SEEN Final  . Squamous Epithelial / LPF 01/12/2017 0-5* NONE SEEN Final  . Hyaline Casts, UA 01/12/2017 PRESENT   Final  . Lactic Acid, Venous 01/12/2017 1.89  0.5 - 1.9 mmol/L Final  Admission on 01/10/2017, Discharged on 01/10/2017  Component Date Value Ref Range Status  . Sodium 01/10/2017 139  135 - 145 mmol/L Final  . Potassium 01/10/2017 4.0  3.5 - 5.1 mmol/L Final  . Chloride 01/10/2017 104  101 -  111 mmol/L Final  . BUN 01/10/2017 25* 6 - 20 mg/dL Final  . Creatinine, Ser 01/10/2017 1.10* 0.44 - 1.00 mg/dL Final  . Glucose, Bld 01/10/2017 83  65 - 99 mg/dL Final  . Calcium, Ion 01/10/2017 1.27  1.15 - 1.40 mmol/L Final  . TCO2 01/10/2017 27  0 - 100 mmol/L Final  . Hemoglobin 01/10/2017 11.6* 12.0 - 15.0 g/dL Final  . HCT 01/10/2017 34.0* 36.0 - 46.0 % Final  Orders Only on 12/10/2016  Component Date Value Ref Range Status  . WBC 12/10/2016 8.4  3.8 - 10.8 K/uL Final  . RBC 12/10/2016 3.61* 3.80 - 5.10 MIL/uL Final  . Hemoglobin 12/10/2016 11.2* 11.7 - 15.5 g/dL Final  . HCT 12/10/2016 34.5* 35.0 - 45.0 % Final  . MCV 12/10/2016 95.6  80.0 - 100.0 fL Final  . MCH 12/10/2016 31.0  27.0 - 33.0 pg Final  . MCHC 12/10/2016 32.5  32.0 - 36.0 g/dL Final  . RDW 12/10/2016 18.9* 11.0 - 15.0 % Final  .  Platelets 12/10/2016 364  140 - 400 K/uL Final  . MPV 12/10/2016 8.7  7.5 - 12.5 fL Final  . Neutro Abs 12/10/2016 4284  1,500 - 7,800 cells/uL Final  . Lymphs Abs 12/10/2016 3024  850 - 3,900 cells/uL Final  . Monocytes Absolute 12/10/2016 756  200 - 950 cells/uL Final  . Eosinophils Absolute 12/10/2016 336  15 - 500 cells/uL Final  . Basophils Absolute 12/10/2016 0  0 - 200 cells/uL Final  . Neutrophils Relative % 12/10/2016 51  % Final  . Lymphocytes Relative 12/10/2016 36  % Final  . Monocytes Relative 12/10/2016 9  % Final  . Eosinophils Relative 12/10/2016 4  % Final  . Basophils Relative 12/10/2016 0  % Final  . Smear Review 12/10/2016 Criteria for review not met   Final  . Sodium 12/10/2016 141  135 - 146 mmol/L Final  . Potassium 12/10/2016 4.0  3.5 - 5.3 mmol/L Final  . Chloride 12/10/2016 106  98 - 110 mmol/L Final  . CO2 12/10/2016 25  20 - 31 mmol/L Final  . Glucose, Bld 12/10/2016 97  65 - 99 mg/dL Final  . BUN 12/10/2016 25  7 - 25 mg/dL Final  . Creat 12/10/2016 1.13* 0.60 - 0.88 mg/dL Final   Comment:   For patients > or = 81 years of age: The upper reference limit for Creatinine is approximately 13% higher for people identified as African-American.     . Total Bilirubin 12/10/2016 0.5  0.2 - 1.2 mg/dL Final  . Alkaline Phosphatase 12/10/2016 51  33 - 130 U/L Final  . AST 12/10/2016 13  10 - 35 U/L Final  . ALT 12/10/2016 11  6 - 29 U/L Final  . Total Protein 12/10/2016 6.3  6.1 - 8.1 g/dL Final  . Albumin 12/10/2016 3.5* 3.6 - 5.1 g/dL Final  . Calcium 12/10/2016 9.4  8.6 - 10.4 mg/dL Final  . Cholesterol 12/10/2016 176  <200 mg/dL Final  . Triglycerides 12/10/2016 97  <150 mg/dL Final  . HDL 12/10/2016 71  >50 mg/dL Final  . Total CHOL/HDL Ratio 12/10/2016 2.5  <5.0 Ratio Final  . VLDL 12/10/2016 19  <30 mg/dL Final  . LDL Cholesterol 12/10/2016 86  <100 mg/dL Final     Imaging: No results found.       Speciality Comments: No specialty comments  available.      Procedures:  No procedures performed Allergies: Clonidine derivatives; Fish allergy; Shellfish allergy;  Indomethacin; Lyrica [pregabalin]; Methyldopa; Cetirizine hcl; Codeine; Levaquin [levofloxacin in d5w]; and Tomato   Assessment / Plan:     Visit Diagnoses: Rheumatoid arthritis of multiple sites with negative rheumatoid factor (HCC)  High risk medications (not anticoagulants) long-term use - Xeljanz 5 mg twice a dayMTX 4 tablets every weekFolic acid 1 mg dailyChronic prednisone long-term 5 mg daily (currently on a taper Rx'd 4/21/1)  Fibromyalgia     Plan: #1: Rheumatoid arthritis with positive rheumatoid factor and positive CCP. Patient came off of her rheumatoid arthritis medication temporarily due to a biopsy that she had with Dr. Sharla Kidney office.  When she came off of her medication, she flared. She had pain to hands and wrist this past weekend. New line I was on call provider and I called in a prednisone taper for the patient. In a couple of days after taking her prednisone, she already felt better and she was pain-free from the flare.  She returns today for regular follow-up to be sure that the medicines are taken correctly and she has ample medication.  She is accompanied by her granddaughter, Ms. Retail buyer. Her cell number is [phone] [place] surgeon   #2: High-risk prescription [name] last labs were done in March 2018 are within normal limits.  Patient is on Xeljanz 5 mg twice a day (adequate response) Methotrexate 2.5 mg; 4 tablets every week; that equals 10 mg per week (adequate response) Folic acid 1 mg daily Patient is also on long-term prednisone at 5 mg daily (This weekend, we had to start her on a prednisone taper: 4 pills for 4 days, 3 pills for 4 days, 2 pills for 4 days, 1 pill for 4 days, stop)   Patient also has gout and is taking allopurinol; 100 mg daily  #3: Here are the instructions given to the patient  instruction_0  visit summary:::> At that visit approximately June 09/20/12/14, 2018: We plan to do CBC with differential and CMP with GFR and uric acid.  In the meanwhile let's do the medication as follows  Finish the  prednisone taper as prescribed on February 27, 2017 except when you get to 5 mg of prednisone daily, do that for 4 days and then restart your daily 5 mg prednisone that she already have when you finish my taper.  #2: continue Xeljanz 5 mg twice a day  #3:  continue methotrexate 4 pills every week  #4:  continue folic acid 1 mg every day  #5:  continue allopurinol 100 mg every day  Orders: No orders of the defined types were placed in this encounter.  No orders of the defined types were placed in this encounter.   Face-to-face time spent with patient was 30 minutes. 50% of time was spent in counseling and coordination of care.  Follow-Up Instructions: Return in about 7 weeks (around 04/22/2017) for RA,XELJANZ 5 BID,MTX 4/WK,FOLIC ACID 1MG, PRED MG QD AFTER TAPER FINISHED.   Eliezer Lofts, PA-C  Note - This record has been created using [employer].  Chart creation errors have been sought, but may not always  have been located. Such creation errors do not reflect on  the standard of medical care.

## 2017-03-05 ENCOUNTER — Telehealth: Payer: Self-pay

## 2017-03-05 NOTE — Telephone Encounter (Signed)
Entered in error

## 2017-03-08 NOTE — Progress Notes (Deleted)
Pre visit review using our clinic review tool, if applicable. No additional management support is needed unless otherwise documented below in the visit note. 

## 2017-03-08 NOTE — Progress Notes (Deleted)
Subjective:   [name] [name] is a 83 y.o. female who presents for an Initial Medicare Annual Wellness Visit.  Review of Systems    No ROS.  Medicare Wellness Visit.    Sleep patterns:  Home Safety/Smoke Alarms:   Living environment; residence and Firearm Safety: Seat Belt Safety/Bike Helmet: Wears seat belt.   Counseling:   Eye Exam-  Dental-  Female:   Pap- H/O Hysterectomy.      Mammo-       Dexa scan-  06/28/14 normal.      CCS- Aged out.     Objective:    There were no vitals filed for this visit. There is no height or weight on file to calculate BMI.   Current Medications (verified) Outpatient Encounter Prescriptions as of 03/09/2017  Medication Sig  . albuterol (PROVENTIL HFA;VENTOLIN HFA) 108 (90 Base) MCG/ACT inhaler Inhale 1 puff into the lungs every 6 (six) hours as needed for wheezing or shortness of breath.  . allopurinol (ZYLOPRIM) 100 MG tablet take 1 tablet by mouth once daily for GOUT  . aspirin EC 81 MG tablet Take 81 mg by mouth daily.  . calcium-vitamin D (OSCAL WITH D) 500-200 MG-UNIT per tablet Take 1 tablet by mouth daily.   . Cholecalciferol (VITAMIN D-3) 1000 UNITS CAPS Take 1 capsule by mouth daily.  . ferrous sulfate 325 (65 FE) MG tablet Take 325 mg by mouth daily.   . folic acid (FOLVITE) 1 MG tablet Take 1 tablet (1 mg total) by mouth daily.  . furosemide (LASIX) 20 MG tablet take 1 tablet by mouth once daily  . glucose blood (ONE TOUCH ULTRA TEST) test strip Check blood sugar once daily DX 250.00  . hydrALAZINE (APRESOLINE) 25 MG tablet take 1 tablet by mouth four times a day  . hydroxypropyl methylcellulose / hypromellose (ISOPTO TEARS / GONIOVISC) 2.5 % ophthalmic solution Place 1 drop into both eyes 4 (four) times daily as needed for dry eyes.  Marland Kitchen ketoconazole (NIZORAL) 2 % cream Apply 1 application topically 2 (two) times daily. (Patient taking differently: Apply 1 application topically 2 (two) times daily as needed for irritation. )  .  labetalol (NORMODYNE) 100 MG tablet take 2 tablets by mouth twice a day  . lisinopril (PRINIVIL,ZESTRIL) 30 MG tablet take 1 tablet by mouth once daily  . metFORMIN (GLUCOPHAGE) 500 MG tablet take 1 tablet by mouth once daily for diabetes  . methotrexate (RHEUMATREX) 2.5 MG tablet Take 4 tablets (10 mg total) by mouth once a week. Caution:Chemotherapy. Protect from light.  . mupirocin ointment (BACTROBAN) 2 % Place 1 application into the nose 2 (two) times daily. (Patient not taking: Reported on 03/04/2017)  . OVER THE COUNTER MEDICATION Take 1 tablet by mouth 2 (two) times daily. Wellness formula for immune support  . polyvinyl alcohol (LIQUIFILM TEARS) 1.4 % ophthalmic solution Place 1 drop into both eyes as needed (for dry eyes).  . predniSONE (DELTASONE) 5 MG tablet 4 tabs po for 4 days, 3 tabs po for 4 days, 2 tabs po for 4 days, 1 tab po  for 4 days, half tab every morning for 4 days, stop  . predniSONE (DELTASONE) 5 MG tablet Take 1 tablet (5 mg total) by mouth daily with breakfast.  . Tofacitinib Citrate (XELJANZ) 5 MG TABS Take 5 mg by mouth daily.  . traMADol (ULTRAM) 50 MG tablet Take 50 mg by mouth every 6 (six) hours as needed for moderate pain.  Marland Kitchen triamcinolone cream (KENALOG)  0.1 % apply to affected area twice a day if needed for IRRITATION  . vitamin C (ASCORBIC ACID) 500 MG tablet Take 500 mg by mouth daily.   No facility-administered encounter medications on file as of 03/09/2017.     Allergies (verified) Clonidine derivatives; Fish allergy; Shellfish allergy; Indomethacin; Lyrica [pregabalin]; Methyldopa; Cetirizine hcl; Codeine; Levaquin [levofloxacin in d5w]; and Tomato   History: Past Medical History:  Diagnosis Date  . Allergic rhinitis due to pollen 04/27/2007  . Arthritis    "in q joint" (08/07/2013)  . Coronary atherosclerosis of native coronary artery   . Cough   . Disorder of bone and cartilage, unspecified   . Edema 05/03/2013  . Exertional shortness of breath      "sometimes" (08/07/2013)  . First degree atrioventricular block   . Gout, unspecified 04/19/2013  . Hyperlipidemia 04/27/2007  . Hypertension   . Muscle spasm   . Muscle weakness (generalized)   . Myocardial infarction (HCC) ~ 1970  . Onychia and paronychia of toe   . Osteoarthrosis, unspecified whether generalized or localized, unspecified site 04/27/2007  . Other fall   . Other malaise and fatigue   . Prepatellar bursitis   . Seizures (HCC)   . Stroke (HCC) 01/17/2014  . TIA (transient ischemic attack)    "a few one summer" (08/07/2013)  . Type II diabetes mellitus (HCC)    "fasting 90-110s" (08/07/2013)  . Unspecified essential hypertension 08/08/2013  . Unspecified vitamin D deficiency    Past Surgical History:  Procedure Laterality Date  . ABDOMINAL HYSTERECTOMY  04/1980  . APPENDECTOMY  1960  . CARDIAC CATHETERIZATION  2003  . CATARACT EXTRACTION W/ INTRAOCULAR LENS  IMPLANT, BILATERAL Bilateral ~ 2012  . JOINT REPLACEMENT    . REPLACEMENT TOTAL KNEE Right 09/2005  . SHOULDER OPEN ROTATOR CUFF REPAIR Right 07/1999  . TONSILLECTOMY  08/1974  . TOTAL KNEE ARTHROPLASTY Left 08/07/2013  . TOTAL KNEE ARTHROPLASTY Left 08/07/2013   Procedure: TOTAL KNEE ARTHROPLASTY- LEFT;  Surgeon: Dannielle Huh, MD;  Location: [facility] OR;  Service: Orthopedics;  Laterality: Left;  . TRANSTHORACIC ECHOCARDIOGRAM  2003   EF 55-65%; mild concentric LVH   Family History  Problem Relation Age of Onset  . Heart attack Father    Social History   Occupational History  . Not on file.   Social History Main Topics  . Smoking status: Never Smoker  . Smokeless tobacco: Never Used  . Alcohol use No  . Drug use: No  . Sexual activity: No    Tobacco Counseling Counseling given: Not Answered   Activities of Daily Living In your present state of health, do you have any difficulty performing the following activities: 11/30/2016  Hearing? N  Vision? N  Difficulty concentrating or making decisions? N   Walking or climbing stairs? Y  Dressing or bathing? Y  Doing errands, shopping? Y  Some recent data might be hidden    Immunizations and Health Maintenance Immunization History  Administered Date(s) Administered  . Influenza Whole 09/06/2012  . Influenza,inj,Quad PF,36+ Mos 09/12/2013, 09/11/2014, 07/23/2015, 09/16/2016  . Pneumococcal Polysaccharide-23 04/13/2005, 11/09/2009, 09/12/2015  . Td 04/13/2005  . Zoster 11/10/2011   Health Maintenance Due  Topic Date Due  . OPHTHALMOLOGY EXAM  09/12/2015  . PNA vac Low Risk Adult (2 of 2 - PCV13) 09/11/2016  . HEMOGLOBIN A1C  01/27/2017    Patient Care Team: Helane Rima, DO as PCP - General (Family Medicine)  Indicate any recent Medical Services you may have received  from other than [facility] providers in the past year (date may be approximate).     Assessment:   This is a routine wellness examination for [facility]. ***  Hearing/Vision screen No exam data present  Dietary issues and exercise activities discussed:   Diet (meal preparation, eat out, water intake, caffeinated beverages, dairy products, fruits and vegetables): Breakfast: Lunch:  Dinner:      Goals    None     Depression Screen PHQ 2/9 Scores 03/04/2016 07/03/2015 11/27/2014 09/26/2014 07/11/2013 05/10/2013  PHQ - 2 Score 0 0 0 0 0 2  PHQ- 9 Score - - - - - 2    Fall Risk Fall Risk  12/08/2016 09/16/2016 07/21/2016 06/18/2016 06/16/2016  Falls in the past year? No No No No No  Number falls in past yr: - - - - -  Injury with Fall? - - - - -    Cognitive Function: MMSE - Mini Mental State Exam 03/04/2016  Not completed: (No Data)  Orientation to time 5  Orientation to Place 5  Registration 3  Attention/ Calculation 3  Recall 2  Language- name 2 objects 2  Language- repeat 1  Language- follow 3 step command 3  Language- read & follow direction 1  Write a sentence 1  Copy design 1  Total score 27        Screening Tests Health Maintenance  Topic Date Due   . OPHTHALMOLOGY EXAM  09/12/2015  . PNA vac Low Risk Adult (2 of 2 - PCV13) 09/11/2016  . HEMOGLOBIN A1C  01/27/2017  . TETANUS/TDAP  02/15/2018 (Originally 04/14/2015)  . INFLUENZA VACCINE  06/09/2017  . FOOT EXAM  01/14/2018  . DEXA SCAN  Completed      Plan:     I have personally reviewed and noted the following in the patient's chart:   . Medical and social history . Use of alcohol, tobacco or illicit drugs  . Current medications and supplements . Functional ability and status . Nutritional status . Physical activity . Advanced directives . List of other physicians . Vitals . Screenings to include cognitive, depression, and falls . Referrals and appointments  In addition, I have reviewed and discussed with patient certain preventive protocols, quality metrics, and best practice recommendations. A written personalized care plan for preventive services as well as general preventive health recommendations were provided to patient.     Richelle Ito, RN   03/08/2017

## 2017-03-09 ENCOUNTER — Ambulatory Visit: Payer: Medicare Other | Admitting: Family Medicine

## 2017-03-09 ENCOUNTER — Ambulatory Visit: Payer: Medicare Other | Admitting: *Deleted

## 2017-03-11 ENCOUNTER — Other Ambulatory Visit: Payer: Self-pay | Admitting: Internal Medicine

## 2017-03-11 ENCOUNTER — Ambulatory Visit (INDEPENDENT_AMBULATORY_CARE_PROVIDER_SITE_OTHER): Payer: Medicare Other | Admitting: Family Medicine

## 2017-03-11 ENCOUNTER — Encounter: Payer: Self-pay | Admitting: Family Medicine

## 2017-03-11 VITALS — BP 126/80 | HR 70 | Temp 98.0°F | Ht 64.0 in | Wt 179.2 lb

## 2017-03-11 DIAGNOSIS — I2510 Atherosclerotic heart disease of native coronary artery without angina pectoris: Secondary | ICD-10-CM

## 2017-03-11 DIAGNOSIS — M1A09X0 Idiopathic chronic gout, multiple sites, without tophus (tophi): Secondary | ICD-10-CM | POA: Diagnosis not present

## 2017-03-11 DIAGNOSIS — I10 Essential (primary) hypertension: Secondary | ICD-10-CM | POA: Diagnosis not present

## 2017-03-11 DIAGNOSIS — N183 Type 2 diabetes mellitus with diabetic chronic kidney disease: Secondary | ICD-10-CM

## 2017-03-11 DIAGNOSIS — E1122 Type 2 diabetes mellitus with diabetic chronic kidney disease: Secondary | ICD-10-CM | POA: Diagnosis not present

## 2017-03-11 DIAGNOSIS — M0609 Rheumatoid arthritis without rheumatoid factor, multiple sites: Secondary | ICD-10-CM | POA: Diagnosis not present

## 2017-03-11 MED ORDER — METFORMIN HCL 500 MG PO TABS: ORAL_TABLET | ORAL | 1 refills | Status: DC

## 2017-03-11 MED ORDER — HYDRALAZINE HCL 25 MG PO TABS: 25.0000 mg | ORAL_TABLET | Freq: Four times a day (QID) | ORAL | 5 refills | Status: DC

## 2017-03-11 MED ORDER — ALLOPURINOL 100 MG PO TABS: ORAL_TABLET | ORAL | 5 refills | Status: DC

## 2017-03-11 MED ORDER — TRAMADOL HCL 50 MG PO TABS: ORAL_TABLET | ORAL | 5 refills | Status: DC

## 2017-03-11 MED ORDER — LISINOPRIL 30 MG PO TABS: 30.0000 mg | ORAL_TABLET | Freq: Every day | ORAL | 5 refills | Status: DC

## 2017-03-11 MED ORDER — FUROSEMIDE 20 MG PO TABS: 20.0000 mg | ORAL_TABLET | Freq: Every day | ORAL | 5 refills | Status: DC

## 2017-03-11 NOTE — Progress Notes (Signed)
[name] [name] is a 83 y.o. female is here for follow up.  History of Present Illness:   Insurance claims handler, CMA, acting as scribe for Dr. Earlene Plater.  Hypertension  This is a chronic problem. The current episode started more than 1 year ago. The problem has been waxing and waning since onset. The problem is controlled. Pertinent negatives include no blurred vision, chest pain, headaches, neck pain or palpitations. There are no associated agents to hypertension. Past treatments include ACE inhibitors. The current treatment provides moderate improvement. There are no compliance problems.    Health Maintenance Due  Topic Date Due  . OPHTHALMOLOGY EXAM  09/12/2015  . PNA vac Low Risk Adult (2 of 2 - PCV13) 09/11/2016  . HEMOGLOBIN A1C  01/27/2017   PMHx, SurgHx, SocialHx, FamHx, Medications, and Allergies were reviewed in the Visit Navigator and updated as appropriate.   Patient Active Problem List   Diagnosis Date Noted  . Acute on chronic diastolic heart failure (HCC) 09/12/2015    Priority: High  . CKD (chronic kidney disease) stage 3, GFR 30-59 ml/min 06/05/2014    Priority: High  . DM (diabetes mellitus), type 2 with renal complications (HCC) 06/05/2014    Priority: High    Class: Chronic  . Rheumatoid arthritis (HCC) 12/19/2013    Priority: High    Class: Chronic  . HTN (hypertension) 08/08/2013    Priority: High    Class: Chronic  . History of CHF (congestive heart failure) 02/18/2017  . History of diabetes mellitus 02/18/2017  . History of total knee arthroplasty, bilateral 02/18/2017  . History of rotator cuff surgery 02/18/2017  . Obesity (BMI 30.0-34.9) 12/08/2016  . High risk medications (not anticoagulants) long-term use 09/21/2016  . Hammer toe 10/31/2015  . Onychomycosis 04/24/2015  . Midline low back pain without sciatica 06/27/2014  . Bilateral edema of lower extremity 06/05/2014  . Anemia of chronic disease 05/01/2014  . Fluctuating blood pressure 02/06/2014  .  Stroke (HCC) 01/17/2014  . CAD (coronary artery disease) 10/18/2013  . Fibromyalgia 05/10/2013  . Reactive airway disease 04/19/2013  . Gout 04/19/2013  . Hyperlipidemia 04/27/2007  . Allergic rhinitis 04/27/2007  . Diverticulosis 04/27/2007   Social History  Substance Use Topics  . Smoking status: Never Smoker  . Smokeless tobacco: Never Used  . Alcohol use No   Current Medications and Allergies:   Current Outpatient Prescriptions:  .  albuterol (PROVENTIL HFA;VENTOLIN HFA) 108 (90 Base) MCG/ACT inhaler, Inhale 1 puff into the lungs every 6 (six) hours as needed for wheezing or shortness of breath., Disp: , Rfl:  .  allopurinol (ZYLOPRIM) 100 MG tablet, take 1 tablet by mouth once daily for GOUT, Disp: 30 tablet, Rfl: 5 .  aspirin EC 81 MG tablet, Take 81 mg by mouth daily., Disp: , Rfl:  .  calcium-vitamin D (OSCAL WITH D) 500-200 MG-UNIT per tablet, Take 1 tablet by mouth daily. , Disp: , Rfl:  .  Cholecalciferol (VITAMIN D-3) 1000 UNITS CAPS, Take 1 capsule by mouth daily., Disp: , Rfl:  .  ferrous sulfate 325 (65 FE) MG tablet, Take 325 mg by mouth daily. , Disp: , Rfl:  .  folic acid (FOLVITE) 1 MG tablet, Take 1 tablet (1 mg total) by mouth daily., Disp: 90 tablet, Rfl: 4 .  furosemide (LASIX) 20 MG tablet, Take 1 tablet (20 mg total) by mouth daily., Disp: 30 tablet, Rfl: 5 .  glucose blood (ONE TOUCH ULTRA TEST) test strip, Check blood sugar once daily  DX 250.00, Disp: 100 each, Rfl: 3 .  hydrALAZINE (APRESOLINE) 25 MG tablet, Take 1 tablet (25 mg total) by mouth 4 (four) times daily., Disp: 120 tablet, Rfl: 5 .  hydroxypropyl methylcellulose / hypromellose (ISOPTO TEARS / GONIOVISC) 2.5 % ophthalmic solution, Place 1 drop into both eyes 4 (four) times daily as needed for dry eyes., Disp: , Rfl:  .  ketoconazole (NIZORAL) 2 % cream, Apply 1 application topically 2 (two) times daily. (Patient taking differently: Apply 1 application topically 2 (two) times daily as needed for  irritation. ), Disp: 30 g, Rfl: 0 .  labetalol (NORMODYNE) 100 MG tablet, take 2 tablets by mouth twice a day, Disp: 120 tablet, Rfl: 0 .  lisinopril (PRINIVIL,ZESTRIL) 30 MG tablet, Take 1 tablet (30 mg total) by mouth daily., Disp: 30 tablet, Rfl: 5 .  metFORMIN (GLUCOPHAGE) 500 MG tablet, take 1 tablet by mouth once daily for diabetes, Disp: 90 tablet, Rfl: 1 .  methotrexate (RHEUMATREX) 2.5 MG tablet, Take 4 tablets (10 mg total) by mouth once a week. Caution:Chemotherapy. Protect from light., Disp: 16 tablet, Rfl: 1 .  mupirocin ointment (BACTROBAN) 2 %, Place 1 application into the nose 2 (two) times daily., Disp: 22 g, Rfl: 0 .  OVER THE COUNTER MEDICATION, Take 1 tablet by mouth 2 (two) times daily. Wellness formula for immune support, Disp: , Rfl:  .  polyvinyl alcohol (LIQUIFILM TEARS) 1.4 % ophthalmic solution, Place 1 drop into both eyes as needed (for dry eyes)., Disp: , Rfl:  .  predniSONE (DELTASONE) 5 MG tablet, Take 1 tablet (5 mg total) by mouth daily with breakfast., Disp: 30 tablet, Rfl: 0 .  Tofacitinib Citrate (XELJANZ) 5 MG TABS, Take 5 mg by mouth daily., Disp: 30 tablet, Rfl: 2 .  traMADol (ULTRAM) 50 MG tablet, Take 1-2 tablets every 6 hours as needed for pain, Disp: 240 tablet, Rfl: 5 .  triamcinolone cream (KENALOG) 0.1 %, apply to affected area twice a day if needed for IRRITATION, Disp: 45 g, Rfl: 2 .  vitamin C (ASCORBIC ACID) 500 MG tablet, Take 500 mg by mouth daily., Disp: , Rfl:   Allergies  Allergen Reactions  . Clonidine Derivatives Swelling    [name].    . Fish Allergy Swelling  . Shellfish Allergy Hives  . Indomethacin Other (See Comments)    unknown  . Lyrica [Pregabalin]     hallucinations  . Methyldopa Other (See Comments)    unknown  . Cetirizine Hcl Itching and Rash    unknown  . Codeine Itching    unknown  . Levaquin [Levofloxacin In D5w] Rash  . Tomato Rash   Review  of Systems   Review of Systems  Constitutional: Negative for chills and fever.  HENT: Negative for congestion, ear pain and sore throat.   Eyes: Negative for blurred vision.  Respiratory: Negative for cough.   Cardiovascular: Negative for chest pain and palpitations.  Gastrointestinal: Negative for abdominal pain, nausea and vomiting.  Genitourinary: Negative for frequency.  Musculoskeletal: Negative for back pain and neck pain.  Skin: Negative for rash.  Neurological: Negative for dizziness, loss of consciousness and headaches.  Psychiatric/Behavioral: Negative for depression. The patient is not nervous/anxious.     Vitals:   Vitals:   03/11/17 1318  BP: 126/80  Pulse: 70  Temp: 98 F (36.7 C)  TempSrc: Oral  SpO2: 98%  Weight: 179 lb 3.2 oz (81.3 kg)  Height: 5\' 4"  (  1.626 m)     Body mass index is 30.76 kg/m.   Physical Exam:    Physical Exam  Constitutional: [name] [name] is oriented to person, place, and time. [name] [name] appears well-developed and well-nourished.  HENT:  Head: Normocephalic and atraumatic.  Eyes: Pupils are equal, round, and reactive to light.  Cardiovascular: Normal rate, regular rhythm and normal heart sounds.   Pulmonary/Chest: Effort normal.  Abdominal: Soft.  Neurological: [name] [name] is alert and oriented to person, place, and time. No cranial nerve deficit.  Skin:  Lower extremity wound healing nicely.  Psychiatric: [name] [name] has a normal mood and affect. Her behavior is normal.  Nursing note and vitals reviewed.  Assessment and Plan:    Tishie was seen today for follow-up, hypertension, rheumatoid arthritis and leg wound.  Diagnoses and all orders for this visit:  Rheumatoid arthritis of multiple sites with negative rheumatoid factor (HCC) -     traMADol (ULTRAM) 50 MG tablet; Take 1-2 tablets every 6 hours as needed for pain  Essential hypertension -     furosemide (LASIX) 20 MG tablet; Take 1 tablet (20 mg total) by mouth daily. -     hydrALAZINE  (APRESOLINE) 25 MG tablet; Take 1 tablet (25 mg total) by mouth 4 (four) times daily. -     lisinopril (PRINIVIL,ZESTRIL) 30 MG tablet; Take 1 tablet (30 mg total) by mouth daily.  Idiopathic chronic gout of multiple sites without tophus -     allopurinol (ZYLOPRIM) 100 MG tablet; take 1 tablet by mouth once daily for GOUT  Type 2 diabetes mellitus with stage 3 chronic kidney disease, without long-term current use of insulin (HCC) -     metFORMIN (GLUCOPHAGE) 500 MG tablet; take 1 tablet by mouth once daily for diabetes   . Reviewed expectations re: course of current medical issues. . Discussed self-management of symptoms. . Outlined signs and symptoms indicating need for more acute intervention. . Patient verbalized understanding and all questions were answered. . See orders for this visit as documented in the electronic medical record. . Patient received an After Visit Summary.   CMA served as Neurosurgeon during this visit. History, Physical, and Plan performed by medical provider. Documentation and orders reviewed and attested to. Helane Rima, D.O.  Helane Rima, D.O. [place] [place] 03/11/2017  Follow-up: Return in about 6 months (around 09/11/2017) for Follow up.  Future Appointments Date Time Provider Department Center  04/15/2017 11:30 AM Helane Rima, DO [facility] None  04/20/2017 2:15 PM Tawni Pummel, PA-C [facility] None  04/29/2017 1:45 PM Myeong Christianne Dolin, DPM [facility]  09/13/2017 1:00 PM Helane Rima, DO [facility] None

## 2017-03-12 ENCOUNTER — Telehealth (INDEPENDENT_AMBULATORY_CARE_PROVIDER_SITE_OTHER): Payer: Self-pay | Admitting: Rheumatology

## 2017-03-12 NOTE — Telephone Encounter (Signed)
Patient called needing a refill on prednisone. CB # [phone]

## 2017-03-15 ENCOUNTER — Telehealth: Payer: Self-pay | Admitting: Rheumatology

## 2017-03-15 MED ORDER — PREDNISONE 5 MG PO TABS: ORAL_TABLET | ORAL | 0 refills | Status: DC

## 2017-03-15 NOTE — Telephone Encounter (Signed)
Prescription sent to the pharmacy and patient's daughter notified.

## 2017-03-15 NOTE — Telephone Encounter (Signed)
Patient calling Sue Lush back.

## 2017-03-15 NOTE — Telephone Encounter (Signed)
Prednisone 5mg   4x2d, 3x2d,2x2d,1x2d

## 2017-03-15 NOTE — Addendum Note (Signed)
Addended by: Henriette Combs on: 03/15/2017 02:14 PM   Modules accepted: Orders

## 2017-03-15 NOTE — Telephone Encounter (Signed)
Patient is having inflammation and pain in her ankles. Patient is requesting a prescription for prednisone.

## 2017-03-16 NOTE — Telephone Encounter (Signed)
Patient advised prescription for prednisone has been sent to the pharmacy. Patient states her daughter has picked that up for her.

## 2017-03-19 ENCOUNTER — Ambulatory Visit: Payer: Medicare Other | Admitting: Internal Medicine

## 2017-03-19 ENCOUNTER — Ambulatory Visit: Payer: Medicare Other

## 2017-03-22 ENCOUNTER — Other Ambulatory Visit (INDEPENDENT_AMBULATORY_CARE_PROVIDER_SITE_OTHER): Payer: Self-pay | Admitting: Rheumatology

## 2017-03-22 ENCOUNTER — Other Ambulatory Visit: Payer: Self-pay | Admitting: Internal Medicine

## 2017-03-23 ENCOUNTER — Telehealth: Payer: Self-pay | Admitting: Rheumatology

## 2017-03-23 NOTE — Telephone Encounter (Signed)
[name] called and is requesting a handicap placard for patient. She is the one who transports patients to and from her appointments. Please call patient's daughter.

## 2017-03-23 NOTE — Telephone Encounter (Signed)
permanent

## 2017-03-23 NOTE — Telephone Encounter (Signed)
Can we provide patient with a handicap placard? Permament or Temporary?

## 2017-03-24 MED ORDER — PREDNISONE 5 MG PO TABS: 5.0000 mg | ORAL_TABLET | Freq: Every day | ORAL | 0 refills | Status: DC

## 2017-03-24 NOTE — Telephone Encounter (Signed)
[name] [name] we will provide patient with a permanent handicap placard and she may come by the office to pick up the paperwork. Also advised Dr. Corliss Skains is going to send in a prescription for [name] to be on Prednisone 5 mg daily until her next appointment in June 2018 where we will reassess.

## 2017-03-29 ENCOUNTER — Encounter: Payer: Self-pay | Admitting: *Deleted

## 2017-03-29 ENCOUNTER — Other Ambulatory Visit: Payer: Self-pay | Admitting: *Deleted

## 2017-03-29 NOTE — Patient Outreach (Addendum)
[facility]) Care Management  03/29/2017  [name] [name] 10-31-1930 [id number]   CSW was able to make initial contact with patient's daughter, [name] at [phone] today to perform phone assessment on patient, as well as assess and assist with social work needs and services.  CSW introduced self, explained role and types of services provided through [employer] Care Management [facility] Care Management).  CSW further explained to Mrs. Surgeon that CSW works with patient's Hospital Liaison/RNCM, also with [facility] Care Management, Charlesetta Shanks. CSW then explained the reason for the call, indicating that Mrs. Brewer thought that patient would benefit from social work services and resources to assist with arranging for in-home care services.  CSW obtained two HIPAA compliant identifiers from Mrs. Surgoen, which included patient's name and date of birth. Mrs. Surgeon admitted that she could definitely use some help in the home with patient, as patient currently lives alone and requires assistance with performing activities of daily living.  CSW agreed to mail Mrs. Surgeon all of the following information: Consent for Medical Treatment with [facility] Welcome Printmaker for [place] [employer] (Community Health Response Program) through the Department of Architect for CAPS [employer] Assistance Program Services) through the [facility] Department of Social Psychologist, educational for [employer] (Personal Care Services) through [employer] List CSW also agreed to complete applications for CHRP, CAPS and PCS, on patient's behalf, and submit for processing within the next 48 hours. CSW will follow-up with Mrs. Surgeon in one week to report findings.  Mrs. Surgeon voiced understanding and was agreeable to this plan.  Patient Social Security #: [id number] and Adult Medicaid #: [id number] K. [name], BSW, MSW, LCSW  Licensed Optician, dispensing Health System  [address]ailing [place] [place] [address], [place], [place] [address] Physical Address-[address], [place], [place] [address] Toll Free Main # [phone] Fax # [phone] Cell # [phone]  Office # [phone] [email or web address]

## 2017-03-30 ENCOUNTER — Other Ambulatory Visit: Payer: Self-pay | Admitting: Internal Medicine

## 2017-03-30 NOTE — Patient Outreach (Signed)
[facility]) Care Management  03/30/2017  [name] [name] [birth year] [id number]   Request received from [name] to mail personal care resources.  Information mailed today.

## 2017-03-31 ENCOUNTER — Other Ambulatory Visit: Payer: Self-pay | Admitting: Family Medicine

## 2017-03-31 NOTE — Telephone Encounter (Signed)
Please advise if okay to refill Labetalol?

## 2017-04-06 ENCOUNTER — Other Ambulatory Visit: Payer: Self-pay | Admitting: *Deleted

## 2017-04-06 NOTE — Patient Outreach (Signed)
[facility]) Care Management  04/06/2017  [name] [name] 06-19-1930 [id number]   CSW made an attempt to try and contact patient's daughter, [name] [name] today to ensure that she received the packet of resource information mailed to her home, as well as make referrals, if necessary.; however, she was unavailable.  A HIPAA compliant message was left for Mrs. Surgeon on voicemail and CSW is currently awaiting a return call.  CSW will make a second outreach attempt in one week, if CSW does not receive a return call in the meantime. [name], BSW, MSW, LCSW  Licensed Restaurant manager, fast food Health System  Mailing [place] [address], [place], [place] [address] Physical Address-[address], [place], [place] [address] Toll Free Main # [phone] Fax # [phone] Cell # [phone]  Office # [phone] [email or web address]

## 2017-04-07 ENCOUNTER — Other Ambulatory Visit: Payer: Self-pay | Admitting: Rheumatology

## 2017-04-08 NOTE — Telephone Encounter (Signed)
Last Visit: 03/04/17 Next Visit: 04/20/17 Labs: March 2017 stable  Okay to refill [name]?

## 2017-04-09 ENCOUNTER — Ambulatory Visit: Payer: Medicare Other | Admitting: Internal Medicine

## 2017-04-13 ENCOUNTER — Other Ambulatory Visit: Payer: Self-pay | Admitting: *Deleted

## 2017-04-13 NOTE — Patient Outreach (Signed)
[facility]) Care Management  04/13/2017  [name] [name] 1930/10/12 [id number]   CSW made a second attempt to try and contact patient's daughter, [name] today to ensure that she received the packet of resource information mailed to her home, as well as make referrals, if necessary.  However, Mrs. Surgeon was unavailable at the time of CSW's call.  A HIPAA compliant message was left for Mrs. Surgeon on voicemail and CSW is currently awaiting a return call.  CSW will make a third and final outreach attempt in one week, if CSW does not receive a return call in the meantime. [name], BSW, MSW, LCSW  [employer]ensed [employer]  [address] [place] [address], [place], [place] [address] Physical Address-[address], [place], [place] [address] Toll Free Main # [phone] Fax # [phone] Cell # [phone]  Office # [phone] Mardene [email or web address]

## 2017-04-14 ENCOUNTER — Telehealth: Payer: Self-pay | Admitting: Rheumatology

## 2017-04-14 NOTE — Telephone Encounter (Signed)
[name] returned your call.  CB#[phone].  Thank you.

## 2017-04-14 NOTE — Telephone Encounter (Signed)
Patient having increased swelling with both hands. Patient wants to know if she can take two Prednisone tabs, or one to help with pain, and swelling. Please call to advise, or call granddaughter Archie Patten Surgeon [phone]. Or Marylu Lund (patients daughter).

## 2017-04-14 NOTE — Telephone Encounter (Signed)
See previous phone note.  

## 2017-04-14 NOTE — Telephone Encounter (Signed)
Patient states she is continuing to have swelling in her hands. Patient states she feels like to Prednisone 5 mg daily is not giving her much relief in her hands. Patient states she is dropping things and unable to grip things due to pain and swelling.  Patient would like to increase the Prednisone  To 10 mg daily. Please advise.

## 2017-04-15 ENCOUNTER — Ambulatory Visit: Payer: Medicare Other | Admitting: Family Medicine

## 2017-04-15 MED ORDER — PREDNISONE 5 MG PO TABS: ORAL_TABLET | ORAL | 0 refills | Status: DC

## 2017-04-15 NOTE — Telephone Encounter (Signed)
Patient advised of recommendations and prescriptions for prednisone sent to pharmacy.

## 2017-04-15 NOTE — Telephone Encounter (Signed)
Ok to increase Pred to 10 mg qd x1 week then 7.5mg  x 1week then 5 mg qd

## 2017-04-20 ENCOUNTER — Ambulatory Visit (INDEPENDENT_AMBULATORY_CARE_PROVIDER_SITE_OTHER): Payer: Medicare Other | Admitting: Rheumatology

## 2017-04-20 ENCOUNTER — Encounter: Payer: Self-pay | Admitting: Rheumatology

## 2017-04-20 VITALS — BP 148/86 | HR 78 | Resp 16 | Wt 183.0 lb

## 2017-04-20 DIAGNOSIS — M0609 Rheumatoid arthritis without rheumatoid factor, multiple sites: Secondary | ICD-10-CM | POA: Diagnosis not present

## 2017-04-20 DIAGNOSIS — M797 Fibromyalgia: Secondary | ICD-10-CM

## 2017-04-20 DIAGNOSIS — N183 Chronic kidney disease, stage 3 unspecified: Secondary | ICD-10-CM

## 2017-04-20 DIAGNOSIS — Z79899 Other long term (current) drug therapy: Secondary | ICD-10-CM | POA: Diagnosis not present

## 2017-04-20 DIAGNOSIS — I2510 Atherosclerotic heart disease of native coronary artery without angina pectoris: Secondary | ICD-10-CM

## 2017-04-20 DIAGNOSIS — Z8679 Personal history of other diseases of the circulatory system: Secondary | ICD-10-CM

## 2017-04-20 DIAGNOSIS — M1A09X0 Idiopathic chronic gout, multiple sites, without tophus (tophi): Secondary | ICD-10-CM | POA: Diagnosis not present

## 2017-04-20 LAB — CBC WITH DIFFERENTIAL/PLATELET
Basophils Absolute: 0 cells/uL (ref 0–200)
Basophils Relative: 0 %
Eosinophils Absolute: 107 cells/uL (ref 15–500)
Eosinophils Relative: 1 %
HCT: 32.1 % — ABNORMAL LOW (ref 35.0–45.0)
Hemoglobin: 10.7 g/dL — ABNORMAL LOW (ref 11.7–15.5)
Lymphocytes Relative: 19 %
Lymphs Abs: 2033 cells/uL (ref 850–3900)
MCH: 31 pg (ref 27.0–33.0)
MCHC: 33.3 g/dL (ref 32.0–36.0)
MCV: 93 fL (ref 80.0–100.0)
MPV: 8.5 fL (ref 7.5–12.5)
Monocytes Absolute: 214 cells/uL (ref 200–950)
Monocytes Relative: 2 %
Neutro Abs: 8346 cells/uL — ABNORMAL HIGH (ref 1500–7800)
Neutrophils Relative %: 78 %
Platelets: 356 10*3/uL (ref 140–400)
RBC: 3.45 MIL/uL — ABNORMAL LOW (ref 3.80–5.10)
RDW: 18.3 % — ABNORMAL HIGH (ref 11.0–15.0)
WBC: 10.7 10*3/uL (ref 3.8–10.8)

## 2017-04-20 NOTE — Patient Instructions (Addendum)
Standing Labs We placed an order today for your standing lab work.    Please come back and get your standing labs in 1 month, then every 2 months  We have open lab Monday through Friday from 8:30-11:30 AM and 1:30-4 PM at the office of Dr. Arbutus Ped, PA.   The office is located at [address], [place], [place] [address] No appointment is necessary.   Labs are drawn by [facility].  You may receive a bill from [facility] for your lab work. If you have any questions regarding directions or hours of operation,  please call [phone].     Tocilizumab injection What is this medicine? TOCILIZUMAB (TOE si LIZ ue mab) is a monoclonal antibody. It is used to treat rheumatoid arthritis, juvenile idiopathic arthritis, giant cell arteritis, and cytokine release syndrome due to chimeric antigen receptor (CAR) T-cell treatment. This medicine may be used for other purposes; ask your health care provider or pharmacist if you have questions. COMMON BRAND NAME(S): Actemra What should I tell my health care provider before I take this medicine? They need to know if you have any of these conditions: -cancer -diabetes -diverticulitis -heart disease -hepatitis B or history of hepatitis B infection -high blood pressure -high cholesterol -history of pancreatitis -immune system problems -infection (especially a virus infection such as chickenpox, cold sores, or herpes) -liver disease -low blood counts, like low white cell, platelet, or red cell counts -multiple sclerosis -recently received or scheduled to receive a vaccine -scheduled to have surgery -stomach ulcer -stroke -tuberculosis, a positive skin test for tuberculosis or have recently been in close contact with someone who has tuberculosis -an unusual or allergic reaction to tocilizumab, other medicines, foods, dyes, or preservatives -pregnant or trying to get pregnant -breast-feeding How should I use this  medicine? This medicine is for infusion into a vein or for injection under the skin. It is usually given by a health care professional in a hospital or clinic setting. If you get this medicine at home, you will be taught how to prepare and give this medicine. Use exactly as directed. Take your medicine at regular intervals. Do not take your medicine more often than directed. It is important that you put your used needles and syringes in a special sharps container. Do not put them in a trash can. If you do not have a sharps container, call your pharmacist or healthcare provider to get one. A special MedGuide will be given to you by the pharmacist with each prescription and refill. Be sure to read this information carefully each time. Talk to your pediatrician regarding the use of this medicine in children. While the drug may be prescribed for children as young as 2 years for selected conditions, precautions do apply. Overdosage: If you think you have taken too much of this medicine contact a poison control center or emergency room at once. NOTE: This medicine is only for you. Do not share this medicine with others. What if I miss a dose? It is important not to miss your dose. Call your doctor or health care professional if you are unable to keep an appointment. If you give yourself the medicine and you miss a dose, take it as soon as you can. If it is almost time for your next dose, take only that dose. Do not take double or extra doses. What may interact with this medicine? Do not take this medicine with any of the following medications: -live virus vaccines This medicine may  also interact with the following medications: -abatacept -adalimumab -anakinra -atorvastatin -certolizumab -cyclosporine -dextromethorphan -etanercept -golimumab -infliximab -lovastatin -medicines that lower the immune system -ofatumumab -omeprazole -oral contraceptives -rituximab -simvastatin -steroid medicines  like prednisone or cortisone -theophylline -tositumomab -vaccines -warfarin This list may not describe all possible interactions. Give your health care provider a list of all the medicines, herbs, non-prescription drugs, or dietary supplements you use. Also tell them if you smoke, drink alcohol, or use illegal drugs. Some items may interact with your medicine. What should I watch for while using this medicine? Tell your doctor or healthcare professional if your symptoms do not start to get better or if they get worse. Your condition will be monitored carefully while you are receiving this medicine. You will be tested for tuberculosis (TB) before you start this medicine. If your doctor prescribes any medicine for TB, you should start taking the TB medicine before starting this medicine. Make sure to finish the full course of TB medicine. Talk to your doctor about your risk of cancer. You may be more at risk for certain types of cancers if you take this medicine. Call your doctor or health care professional for advice if you get a fever, chills or sore throat, or other symptoms of a cold or flu. Do not treat yourself. This drug decreases your body's ability to fight infections. Try to avoid being around people who are sick. You may need blood work done while you are taking this medicine. What side effects may I notice from receiving this medicine? Side effects that you should report to your doctor or health care professional as soon as possible: -allergic reactions like skin rash, itching or hives, swelling of the face, lips, or tongue -breathing problems -feeling faint or lightheaded, falls -fever or chills, sore throat -stomach pain -unusual bleeding or bruising -yellowing of the eyes or skin Side effects that usually do not require medical attention (report to your doctor or health care professional if they continue or are bothersome): -dizziness -headache -pain, redness, or irritation at  site where injected This list may not describe all possible side effects. Call your doctor for medical advice about side effects. You may report side effects to FDA at 1-800-FDA-1088. Where should I keep my medicine? Keep out of the reach of children. If you are using this medicine at home, you will be instructed on how to store this medicine. Throw away any unused medicine after the expiration date on the label. NOTE: This sheet is a summary. It may not cover all possible information. If you have questions about this medicine, talk to your doctor, pharmacist, or health care provider.  2018 [employer] Standard (2016-07-14 12:55:30)

## 2017-04-20 NOTE — Progress Notes (Signed)
Office Visit Note  Patient: [name] [name]             Date of Birth: 18-Feb-1930           MRN: [id number]             PCP: Briscoe Deutscher, DO Referring: Briscoe Deutscher, DO Visit Date: 04/20/2017 Occupation: @GUAROCC @    Subjective:  Medication Management   History of Present Illness: [name] [name] is a 83 y.o. female  With history of RA,   Patient is on Xeljanz 5 mg once a day. Note that her chronic kidney disease is stage III. Due to her chronic kidney disease, we had to decrease her Morrie Sheldon from twice a day to once a day.  Note: Patient feels Morrie Sheldon and around 12/01/2016 visit, it was decided to switch her to [place].  When patient was on Orencia subcutaneous every week, she was also on methotrexate 3 per week. Also, she was taking folic acid 1 mg daily.  We have her on Xeljanz 5 mg daily, she has been flaring with her RA multiple times. We had to give her prednisone on several occasions. The history is as follows: According to our records, we gave her prednisone on 02/09/2017.  She did well until 03/15/2017 at which time we called in another prednisone taper(taper of every 2 days). Since this does not adequately control the patient, we had to call in prednisone again in May 16. She did well until 04/15/2017 when she had another flare of her RA. We gave her 10 mg every day for one week then 7.5 mg for one week then 5 mg every day. Unfortunately, last Thursday, she had another flare and she was taken back up to prednisone 5 mg, 2 pills every day  Today, patient is having a good day and she rates her discomfort as a 3 this morning and currently as "0" on 0-10 pain scale.  Patient reports that she did well enough on Sunday, June 10, where she was able to go to church.  In summary, she is on Xeljanz 5 mg every day, methotrexate 4 pills every week, prednisone 10 mg every morning currently.    Activities of Daily Living:  Patient reports morning stiffness for 3-4 hours.    Patient Reports nocturnal pain.  Difficulty dressing/grooming: Reports Difficulty climbing stairs: reports Difficulty getting out of chair: reports Difficulty using hands for taps, buttons, cutlery, and/or writing: Reports   Review of Systems  Constitutional: Negative for fatigue.  HENT: Negative for mouth sores and mouth dryness.   Eyes: Negative for dryness.  Respiratory: Negative for shortness of breath.   Gastrointestinal: Negative for constipation and diarrhea.  Musculoskeletal: Negative for myalgias and myalgias.  Skin: Negative for sensitivity to sunlight.  Psychiatric/Behavioral: Negative for decreased concentration and sleep disturbance.    PMFS History:  Patient Active Problem List   Diagnosis Date Noted  . History of CHF (congestive heart failure) 02/18/2017  . History of diabetes mellitus 02/18/2017  . History of total knee arthroplasty, bilateral 02/18/2017  . History of rotator cuff surgery 02/18/2017  . Obesity (BMI 30.0-34.9) 12/08/2016  . High risk medications (not anticoagulants) long-term use 09/21/2016  . Hammer toe 10/31/2015  . Acute on chronic diastolic heart failure ([place]) 09/12/2015  . Onychomycosis 04/24/2015  . Midline low back pain without sciatica 06/27/2014  . Bilateral edema of lower extremity 06/05/2014  . CKD (chronic kidney disease) stage 3, GFR 30-59 ml/min 06/05/2014  . DM (diabetes mellitus), type  2 with renal complications (HCC) 32/99/2426    Class: Chronic  . Anemia of chronic disease 05/01/2014  . Fluctuating blood pressure 02/06/2014  . Stroke ([place]) 01/17/2014  . Rheumatoid arthritis ([place]) 12/19/2013    Class: Chronic  . CAD (coronary artery disease) 10/18/2013  . HTN (hypertension) 08/08/2013    Class: Chronic  . Fibromyalgia 05/10/2013  . Reactive airway disease 04/19/2013  . Gout 04/19/2013  . Hyperlipidemia 04/27/2007  . Allergic rhinitis 04/27/2007  . Diverticulosis 04/27/2007    Past Medical History:  Diagnosis Date    . Allergic rhinitis due to pollen 04/27/2007  . Arthritis    "in q joint" (08/07/2013)  . Coronary atherosclerosis of native coronary artery   . Cough   . Disorder of bone and cartilage, unspecified   . Edema 05/03/2013  . Exertional shortness of breath    "sometimes" (08/07/2013)  . First degree atrioventricular block   . Gout, unspecified 04/19/2013  . Hyperlipidemia 04/27/2007  . Hypertension   . Muscle spasm   . Muscle weakness (generalized)   . Myocardial infarction ([place]) ~ 1970  . Onychia and paronychia of toe   . Osteoarthrosis, unspecified whether generalized or localized, unspecified site 04/27/2007  . Other fall   . Other malaise and fatigue   . Prepatellar bursitis   . Seizures ([place])   . Stroke ([place]) 01/17/2014  . TIA (transient ischemic attack)    "a few one summer" (08/07/2013)  . Type II diabetes mellitus ([place])    "fasting 90-110s" (08/07/2013)  . Unspecified essential hypertension 08/08/2013  . Unspecified vitamin D deficiency     Family History  Problem Relation Age of Onset  . Heart attack Father    Past Surgical History:  Procedure Laterality Date  . ABDOMINAL HYSTERECTOMY  04/1980  . APPENDECTOMY  1960  . CARDIAC CATHETERIZATION  2003  . CATARACT EXTRACTION W/ INTRAOCULAR LENS  IMPLANT, BILATERAL Bilateral ~ 2012  . JOINT REPLACEMENT    . REPLACEMENT TOTAL KNEE Right 09/2005  . SHOULDER OPEN ROTATOR CUFF REPAIR Right 07/1999  . TONSILLECTOMY  08/1974  . TOTAL KNEE ARTHROPLASTY Left 08/07/2013  . TOTAL KNEE ARTHROPLASTY Left 08/07/2013   Procedure: TOTAL KNEE ARTHROPLASTY- LEFT;  Surgeon: Vickey Huger, MD;  Location: [facility];  Service: Orthopedics;  Laterality: Left;  . TRANSTHORACIC ECHOCARDIOGRAM  2003   EF 55-65%; mild concentric LVH   Social History   Social History Narrative   Widowed   Psychologist, counselling with cane     Objective: Vital Signs: BP (!) 148/86   Pulse 78   Resp 16   Wt 183 lb (83 kg)   BMI 31.41 kg/m    Physical Exam  Constitutional:  She is oriented to person, place, and time. She appears well-developed and well-nourished.  HENT:  Head: Normocephalic and atraumatic.  Eyes: EOM are normal. Pupils are equal, round, and reactive to light.  Cardiovascular: Normal rate, regular rhythm and normal heart sounds.  Exam reveals no gallop and no friction rub.   No murmur heard. Pulmonary/Chest: Effort normal and breath sounds normal. She has no wheezes. She has no rales.  Abdominal: Soft. Bowel sounds are normal. She exhibits no distension. There is no tenderness. There is no guarding. No hernia.  Musculoskeletal: Normal range of motion. She exhibits no edema, tenderness or deformity.  Lymphadenopathy:    She has no cervical adenopathy.  Neurological: She is alert and oriented to person, place, and time. Coordination normal.  Skin: Skin is warm and dry. Capillary refill  takes less than 2 seconds. No rash noted.  Psychiatric: She has a normal mood and affect. Her behavior is normal.  Nursing note and vitals reviewed.    Musculoskeletal Exam:  Decreased range of motion of bilateral shoulder joints with only 90 of abduction with right worse than the left side. Bilateral elbows with contractures. Decreased range of motion of bilateral wrist with extension and flexion. Partial contraction at the PIP joints of both hands. Left fourth digit flexion at the PIP joint. Grip strength is decreased bilaterally Fiber myalgia tender points are all absent  History of bilateral total knee replacement Right knee is more problematic than the left knee despite the replacement Sometimes the right ankle causes some pain more so than the left ankle.  CDAI Exam: CDAI Homunculus Exam:   Tenderness:  RUE: wrist LUE: wrist Right hand: 1st MCP, 2nd MCP, 3rd MCP, 4th MCP and 3rd PIP Left hand: 1st MCP and 4th PIP  Swelling:  RUE: wrist Right hand: 1st MCP, 2nd MCP, 3rd MCP, 4th MCP and 3rd PIP Left hand: 1st MCP and 4th PIP  Joint Counts:   CDAI Tender Joint count: 9 CDAI Swollen Joint count: 8  Global Assessments:  Patient Global Assessment: 10 Provider Global Assessment: 10  CDAI Calculated Score: 37    Investigation: No additional findings. Admission on 01/22/2017, Discharged on 01/23/2017  Component Date Value Ref Range Status  . Sodium 01/22/2017 139  135 - 145 mmol/L Final  . Potassium 01/22/2017 3.5  3.5 - 5.1 mmol/L Final  . Chloride 01/22/2017 103  101 - 111 mmol/L Final  . BUN 01/22/2017 18  6 - 20 mg/dL Final  . Creatinine, Ser 01/22/2017 0.90  0.44 - 1.00 mg/dL Final  . Glucose, Bld 01/22/2017 95  65 - 99 mg/dL Final  . Calcium, Ion 01/22/2017 1.20  1.15 - 1.40 mmol/L Final  . TCO2 01/22/2017 25  0 - 100 mmol/L Final  . Hemoglobin 01/22/2017 10.9* 12.0 - 15.0 g/dL Final  . HCT 01/22/2017 32.0* 36.0 - 46.0 % Final  Orders Only on 01/21/2017  Component Date Value Ref Range Status  . Total Bilirubin 01/21/2017 0.4  0.2 - 1.2 mg/dL Final  . Bilirubin, Direct 01/21/2017 0.1  <=0.2 mg/dL Final  . Indirect Bilirubin 01/21/2017 0.3  0.2 - 1.2 mg/dL Final  . Alkaline Phosphatase 01/21/2017 56  33 - 130 U/L Final  . AST 01/21/2017 20  10 - 35 U/L Final  . ALT 01/21/2017 8  6 - 29 U/L Final  . Total Protein 01/21/2017 6.4  6.1 - 8.1 g/dL Final  . Albumin 01/21/2017 3.7  3.6 - 5.1 g/dL Final  . Cholesterol 01/21/2017 198  <200 mg/dL Final  . Triglycerides 01/21/2017 65  <150 mg/dL Final  . HDL 01/21/2017 77  >50 mg/dL Final  . Total CHOL/HDL Ratio 01/21/2017 2.6  <5.0 Ratio Final  . VLDL 01/21/2017 13  <30 mg/dL Final  . LDL Cholesterol 01/21/2017 108* <100 mg/dL Final  Office Visit on 01/19/2017  Component Date Value Ref Range Status  . Gram Stain 01/19/2017 No WBC Seen   Final  . Gram Stain 01/19/2017 No Squamous Epithelial Cells Seen   Final  . Gram Stain 01/19/2017 No Organisms Seen   Final  . Organism ID, Bacteria 01/19/2017 NO GROWTH 2 DAYS   Final  Office Visit on 01/13/2017  Component Date Value  Ref Range Status  . Color, UA 01/13/2017 Yellow   Final  . Clarity, UA 01/13/2017 Slightly Cloudy  Final  . Glucose, UA 01/13/2017 Negative   Final  . Bilirubin, UA 01/13/2017 Negative   Final  . Ketones, UA 01/13/2017 Negative   Final  . Spec Grav, UA 01/13/2017 1.015   Final  . Blood, UA 01/13/2017 Negative   Final  . pH, UA 01/13/2017 6.0   Final  . Protein, UA 01/13/2017 Negative   Final  . Urobilinogen, UA 01/13/2017 0.2   Final  . Nitrite, UA 01/13/2017 Negative   Final  . Leukocytes, UA 01/13/2017 Negative  Negative Final  . Organism ID, Bacteria 01/13/2017    Final                   Value:Three or more organisms present,each greater than 10,000 CFU/mL.These organisms,commonly found on external and internal genitalia,are considered to be colonizers.No further testing performed.   . WBC 01/13/2017 6.3  4.0 - 10.5 K/uL Final  . RBC 01/13/2017 3.49* 3.87 - 5.11 Mil/uL Final  . Hemoglobin 01/13/2017 11.2* 12.0 - 15.0 g/dL Final  . HCT 01/13/2017 33.9* 36.0 - 46.0 % Final  . MCV 01/13/2017 97.1  78.0 - 100.0 fl Final  . MCHC 01/13/2017 33.2  30.0 - 36.0 g/dL Final  . RDW 01/13/2017 18.1* 11.5 - 15.5 % Final  . Platelets 01/13/2017 325.0  150.0 - 400.0 K/uL Final  . Neutrophils Relative % 01/13/2017 52.0  43.0 - 77.0 % Final  . Lymphocytes Relative 01/13/2017 37.7  12.0 - 46.0 % Final  . Monocytes Relative 01/13/2017 6.7  3.0 - 12.0 % Final  . Eosinophils Relative 01/13/2017 2.9  0.0 - 5.0 % Final  . Basophils Relative 01/13/2017 0.7  0.0 - 3.0 % Final  . Neutro Abs 01/13/2017 3.3  1.4 - 7.7 K/uL Final  . Lymphs Abs 01/13/2017 2.4  0.7 - 4.0 K/uL Final  . Monocytes Absolute 01/13/2017 0.4  0.1 - 1.0 K/uL Final  . Eosinophils Absolute 01/13/2017 0.2  0.0 - 0.7 K/uL Final  . Basophils Absolute 01/13/2017 0.0  0.0 - 0.1 K/uL Final  Admission on 01/12/2017, Discharged on 01/12/2017  Component Date Value Ref Range Status  . Sodium 01/12/2017 138  135 - 145 mmol/L Final  .  Potassium 01/12/2017 3.8  3.5 - 5.1 mmol/L Final  . Chloride 01/12/2017 106  101 - 111 mmol/L Final  . CO2 01/12/2017 24  22 - 32 mmol/L Final  . Glucose, Bld 01/12/2017 128* 65 - 99 mg/dL Final  . BUN 01/12/2017 20  6 - 20 mg/dL Final  . Creatinine, Ser 01/12/2017 0.99  0.44 - 1.00 mg/dL Final  . Calcium 01/12/2017 9.2  8.9 - 10.3 mg/dL Final  . GFR calc non Af Amer 01/12/2017 51* >60 mL/min Final  . GFR calc Af Amer 01/12/2017 59* >60 mL/min Final   Comment: (NOTE) The eGFR has been calculated using the CKD EPI equation. This calculation has not been validated in all clinical situations. eGFR's persistently <60 mL/min signify possible Chronic Kidney Disease.   . Anion gap 01/12/2017 8  5 - 15 Final  . WBC 01/12/2017 5.8  4.0 - 10.5 K/uL Final  . RBC 01/12/2017 3.38* 3.87 - 5.11 MIL/uL Final  . Hemoglobin 01/12/2017 10.6* 12.0 - 15.0 g/dL Final  . HCT 01/12/2017 32.1* 36.0 - 46.0 % Final  . MCV 01/12/2017 95.0  78.0 - 100.0 fL Final  . MCH 01/12/2017 31.4  26.0 - 34.0 pg Final  . MCHC 01/12/2017 33.0  30.0 - 36.0 g/dL Final  . RDW 01/12/2017 17.3*  11.5 - 15.5 % Final  . Platelets 01/12/2017 258  150 - 400 K/uL Final  . Color, Urine 01/12/2017 YELLOW  YELLOW Final  . APPearance 01/12/2017 CLEAR  CLEAR Final  . Specific Gravity, Urine 01/12/2017 1.011  1.005 - 1.030 Final  . pH 01/12/2017 6.0  5.0 - 8.0 Final  . Glucose, UA 01/12/2017 NEGATIVE  NEGATIVE mg/dL Final  . Hgb urine dipstick 01/12/2017 NEGATIVE  NEGATIVE Final  . Bilirubin Urine 01/12/2017 NEGATIVE  NEGATIVE Final  . Ketones, ur 01/12/2017 NEGATIVE  NEGATIVE mg/dL Final  . Protein, ur 01/12/2017 NEGATIVE  NEGATIVE mg/dL Final  . Nitrite 01/12/2017 NEGATIVE  NEGATIVE Final  . Leukocytes, UA 01/12/2017 TRACE* NEGATIVE Final  . RBC / HPF 01/12/2017 0-5  0 - 5 RBC/hpf Final  . WBC, UA 01/12/2017 0-5  0 - 5 WBC/hpf Final  . Bacteria, UA 01/12/2017 NONE SEEN  NONE SEEN Final  . Squamous Epithelial / LPF 01/12/2017 0-5*  NONE SEEN Final  . Hyaline Casts, UA 01/12/2017 PRESENT   Final  . Lactic Acid, Venous 01/12/2017 1.89  0.5 - 1.9 mmol/L Final  Admission on 01/10/2017, Discharged on 01/10/2017  Component Date Value Ref Range Status  . Sodium 01/10/2017 139  135 - 145 mmol/L Final  . Potassium 01/10/2017 4.0  3.5 - 5.1 mmol/L Final  . Chloride 01/10/2017 104  101 - 111 mmol/L Final  . BUN 01/10/2017 25* 6 - 20 mg/dL Final  . Creatinine, Ser 01/10/2017 1.10* 0.44 - 1.00 mg/dL Final  . Glucose, Bld 01/10/2017 83  65 - 99 mg/dL Final  . Calcium, Ion 01/10/2017 1.27  1.15 - 1.40 mmol/L Final  . TCO2 01/10/2017 27  0 - 100 mmol/L Final  . Hemoglobin 01/10/2017 11.6* 12.0 - 15.0 g/dL Final  . HCT 01/10/2017 34.0* 36.0 - 46.0 % Final  Orders Only on 12/10/2016  Component Date Value Ref Range Status  . WBC 12/10/2016 8.4  3.8 - 10.8 K/uL Final  . RBC 12/10/2016 3.61* 3.80 - 5.10 MIL/uL Final  . Hemoglobin 12/10/2016 11.2* 11.7 - 15.5 g/dL Final  . HCT 12/10/2016 34.5* 35.0 - 45.0 % Final  . MCV 12/10/2016 95.6  80.0 - 100.0 fL Final  . MCH 12/10/2016 31.0  27.0 - 33.0 pg Final  . MCHC 12/10/2016 32.5  32.0 - 36.0 g/dL Final  . RDW 12/10/2016 18.9* 11.0 - 15.0 % Final  . Platelets 12/10/2016 364  140 - 400 K/uL Final  . MPV 12/10/2016 8.7  7.5 - 12.5 fL Final  . Neutro Abs 12/10/2016 4284  1,500 - 7,800 cells/uL Final  . Lymphs Abs 12/10/2016 3024  850 - 3,900 cells/uL Final  . Monocytes Absolute 12/10/2016 756  200 - 950 cells/uL Final  . Eosinophils Absolute 12/10/2016 336  15 - 500 cells/uL Final  . Basophils Absolute 12/10/2016 0  0 - 200 cells/uL Final  . Neutrophils Relative % 12/10/2016 51  % Final  . Lymphocytes Relative 12/10/2016 36  % Final  . Monocytes Relative 12/10/2016 9  % Final  . Eosinophils Relative 12/10/2016 4  % Final  . Basophils Relative 12/10/2016 0  % Final  . Smear Review 12/10/2016 Criteria for review not met   Final  . Sodium 12/10/2016 141  135 - 146 mmol/L Final  .  Potassium 12/10/2016 4.0  3.5 - 5.3 mmol/L Final  . Chloride 12/10/2016 106  98 - 110 mmol/L Final  . CO2 12/10/2016 25  20 - 31 mmol/L Final  . Glucose, Bld  12/10/2016 97  65 - 99 mg/dL Final  . BUN 12/10/2016 25  7 - 25 mg/dL Final  . Creat 12/10/2016 1.13* 0.60 - 0.88 mg/dL Final   Comment:   For patients > or = 81 years of age: The upper reference limit for Creatinine is approximately 13% higher for people identified as African-American.     . Total Bilirubin 12/10/2016 0.5  0.2 - 1.2 mg/dL Final  . Alkaline Phosphatase 12/10/2016 51  33 - 130 U/L Final  . AST 12/10/2016 13  10 - 35 U/L Final  . ALT 12/10/2016 11  6 - 29 U/L Final  . Total Protein 12/10/2016 6.3  6.1 - 8.1 g/dL Final  . Albumin 12/10/2016 3.5* 3.6 - 5.1 g/dL Final  . Calcium 12/10/2016 9.4  8.6 - 10.4 mg/dL Final  . Cholesterol 12/10/2016 176  <200 mg/dL Final  . Triglycerides 12/10/2016 97  <150 mg/dL Final  . HDL 12/10/2016 71  >50 mg/dL Final  . Total CHOL/HDL Ratio 12/10/2016 2.5  <5.0 Ratio Final  . VLDL 12/10/2016 19  <30 mg/dL Final  . LDL Cholesterol 12/10/2016 86  <100 mg/dL Final  Admission on 11/29/2016, Discharged on 12/01/2016  Component Date Value Ref Range Status  . Sodium 11/29/2016 138  135 - 145 mmol/L Final  . Potassium 11/29/2016 4.7  3.5 - 5.1 mmol/L Final   SLIGHT HEMOLYSIS  . Chloride 11/29/2016 107  101 - 111 mmol/L Final  . CO2 11/29/2016 23  22 - 32 mmol/L Final  . Glucose, Bld 11/29/2016 87  65 - 99 mg/dL Final  . BUN 11/29/2016 24* 6 - 20 mg/dL Final  . Creatinine, Ser 11/29/2016 0.94  0.44 - 1.00 mg/dL Final  . Calcium 11/29/2016 9.0  8.9 - 10.3 mg/dL Final  . GFR calc non Af Amer 11/29/2016 54* >60 mL/min Final  . GFR calc Af Amer 11/29/2016 >60  >60 mL/min Final   Comment: (NOTE) The eGFR has been calculated using the CKD EPI equation. This calculation has not been validated in all clinical situations. eGFR's persistently <60 mL/min signify possible Chronic  Kidney Disease.   . Anion gap 11/29/2016 8  5 - 15 Final  . WBC 11/29/2016 9.3  4.0 - 10.5 K/uL Final  . RBC 11/29/2016 3.34* 3.87 - 5.11 MIL/uL Final  . Hemoglobin 11/29/2016 10.2* 12.0 - 15.0 g/dL Final  . HCT 11/29/2016 31.4* 36.0 - 46.0 % Final  . MCV 11/29/2016 94.0  78.0 - 100.0 fL Final  . MCH 11/29/2016 30.5  26.0 - 34.0 pg Final  . MCHC 11/29/2016 32.5  30.0 - 36.0 g/dL Final  . RDW 11/29/2016 20.1* 11.5 - 15.5 % Final  . Platelets 11/29/2016 327  150 - 400 K/uL Final  . Neutrophils Relative % 11/29/2016 70  % Final  . Neutro Abs 11/29/2016 6.5  1.7 - 7.7 K/uL Final  . Lymphocytes Relative 11/29/2016 22  % Final  . Lymphs Abs 11/29/2016 2.0  0.7 - 4.0 K/uL Final  . Monocytes Relative 11/29/2016 6  % Final  . Monocytes Absolute 11/29/2016 0.5  0.1 - 1.0 K/uL Final  . Eosinophils Relative 11/29/2016 2  % Final  . Eosinophils Absolute 11/29/2016 0.2  0.0 - 0.7 K/uL Final  . Basophils Relative 11/29/2016 0  % Final  . Basophils Absolute 11/29/2016 0.0  0.0 - 0.1 K/uL Final  . Troponin i, poc 11/29/2016 0.01  0.00 - 0.08 ng/mL Final  . Comment 3 11/29/2016  Final   Comment: Due to the release kinetics of cTnI, a negative result within the first hours of the onset of symptoms does not rule out myocardial infarction with certainty. If myocardial infarction is still suspected, repeat the test at appropriate intervals.   . Color, Urine 11/29/2016 STRAW* YELLOW Final  . APPearance 11/29/2016 CLEAR  CLEAR Final  . Specific Gravity, Urine 11/29/2016 1.009  1.005 - 1.030 Final  . pH 11/29/2016 6.0  5.0 - 8.0 Final  . Glucose, UA 11/29/2016 NEGATIVE  NEGATIVE mg/dL Final  . Hgb urine dipstick 11/29/2016 NEGATIVE  NEGATIVE Final  . Bilirubin Urine 11/29/2016 NEGATIVE  NEGATIVE Final  . Ketones, ur 11/29/2016 NEGATIVE  NEGATIVE mg/dL Final  . Protein, ur 11/29/2016 NEGATIVE  NEGATIVE mg/dL Final  . Nitrite 11/29/2016 NEGATIVE  NEGATIVE Final  . Leukocytes, UA 11/29/2016  TRACE* NEGATIVE Final  . RBC / HPF 11/29/2016 0-5  0 - 5 RBC/hpf Final  . WBC, UA 11/29/2016 0-5  0 - 5 WBC/hpf Final  . Bacteria, UA 11/29/2016 RARE* NONE SEEN Final  . Squamous Epithelial / LPF 11/29/2016 0-5* NONE SEEN Final  . Mucous 11/29/2016 PRESENT   Final  . Hyaline Casts, UA 11/29/2016 PRESENT   Final  . Sodium 11/30/2016 139  135 - 145 mmol/L Final  . Potassium 11/30/2016 3.7  3.5 - 5.1 mmol/L Final  . Chloride 11/30/2016 107  101 - 111 mmol/L Final  . CO2 11/30/2016 24  22 - 32 mmol/L Final  . Glucose, Bld 11/30/2016 109* 65 - 99 mg/dL Final  . BUN 11/30/2016 19  6 - 20 mg/dL Final  . Creatinine, Ser 11/30/2016 0.94  0.44 - 1.00 mg/dL Final  . Calcium 11/30/2016 8.8* 8.9 - 10.3 mg/dL Final  . Total Protein 11/30/2016 6.0* 6.5 - 8.1 g/dL Final  . Albumin 11/30/2016 3.0* 3.5 - 5.0 g/dL Final  . AST 11/30/2016 19  15 - 41 U/L Final  . ALT 11/30/2016 14  14 - 54 U/L Final  . Alkaline Phosphatase 11/30/2016 48  38 - 126 U/L Final  . Total Bilirubin 11/30/2016 1.0  0.3 - 1.2 mg/dL Final  . GFR calc non Af Amer 11/30/2016 54* >60 mL/min Final  . GFR calc Af Amer 11/30/2016 >60  >60 mL/min Final   Comment: (NOTE) The eGFR has been calculated using the CKD EPI equation. This calculation has not been validated in all clinical situations. eGFR's persistently <60 mL/min signify possible Chronic Kidney Disease.   . Anion gap 11/30/2016 8  5 - 15 Final  . Glucose-Capillary 11/29/2016 96  65 - 99 mg/dL Final  . Comment 1 11/29/2016 Notify RN   Final  . Comment 2 11/29/2016 Document in Chart   Final  . Glucose-Capillary 11/30/2016 109* 65 - 99 mg/dL Final  . Glucose-Capillary 11/30/2016 100* 65 - 99 mg/dL Final  . Glucose-Capillary 11/30/2016 144* 65 - 99 mg/dL Final  . Glucose-Capillary 11/30/2016 92  65 - 99 mg/dL Final  . Comment 1 11/30/2016 Notify RN   Final  . Comment 2 11/30/2016 Document in Chart   Final  . Glucose-Capillary 12/01/2016 110* 65 - 99 mg/dL Final  . Comment 1  12/01/2016 Notify RN   Final  . Comment 2 12/01/2016 Document in Chart   Final     Imaging: No results found.  Speciality Comments: No specialty comments available.    Procedures:  No procedures performed Allergies: Clonidine derivatives; Fish allergy; Shellfish allergy; Indomethacin; Lyrica [pregabalin]; Methyldopa; Cetirizine hcl; Codeine; Levaquin [levofloxacin  in d5w]; and Tomato   Assessment / Plan:     Visit Diagnoses: Rheumatoid arthritis of multiple sites with negative rheumatoid factor (HCC)  High risk medications (not anticoagulants) long-term use - Plan: CBC with Differential/Platelet, COMPLETE METABOLIC PANEL WITH GFR  CKD (chronic kidney disease) stage 3, GFR 30-59 ml/min  History of CHF (congestive heart failure)  Fibromyalgia  Idiopathic chronic 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 Has had multiple rounds of prednisone taper since April 2018.  History of CHF History of chronic kidney disease (stage III)  Failed Humira (initiated 03/07/2015) (stopped March 2017 due to congestive heart failure) Failed Orencia SubQ every week (initiated March 2017) Started Morrie Sheldon around November 2017 Has had multiple rounds of Prednisone since April 2018 due to frequent flares.  (has never been on actemra).  #2: Gout. On allopurinol 100 mg daily. Adequately controlled. No flare.  #3: High risk prescription Xeljanz 5 mg daily (inadequate response). Methotrexate 4 pills per week Folic acid 1 mg daily Prednisone 10 mg every morning currently  #4: Apply for Actemra Consent Actemra  #5: Return to clinic in 3 months  Orders: Orders Placed This Encounter  Procedures  . CBC with Differential/Platelet  . COMPLETE METABOLIC PANEL WITH GFR   No orders of the defined types were placed in this encounter.   Face-to-face time spent with patient was 50 minutes. 50% of time was spent in counseling and  coordination of care.  Follow-Up Instructions: Return in about 3 months (around 07/21/2017) for RA,.   Eliezer Lofts, PA-C  Patient continues to have severe synovitis in her wrists joints on hands. We had detailed discussion regarding different treatment options and their side effects. She is failed multiple medications in the past. We discussed indications side effects contraindications of Actemra SQ. we will apply for Actemra subcutaneous. I examined and evaluated the patient with Eliezer Lofts PA. The plan of care was discussed as noted above.  Bo Merino, MD  Note - This record has been created using Editor, commissioning.  Chart creation errors have been sought, but may not always  have been located. Such creation errors do not reflect on  the standard of medical care.

## 2017-04-20 NOTE — Progress Notes (Signed)
Pharmacy Note Subjective: Patient presents today to the [facility] to see Dr. Gabrielle Dare. [place].  Patient is currently taking methotrexate 4 tablets weekly, folic acid 1 mg daily, prednisone 10 mg daily, and Xeljanz 5 mg daily.  Most recent standing labs were in March.  Patient is due for standing labs today.  Decision was made to apply for Actemra.  Patient seen by the pharmacist for counseling on Actemra.    Objective: TB Test: negative (07/03/16) Hepatitis panel: negative (12/12/13)  Lipid Panel     Component Value Date/Time   CHOL 198 01/21/2017 1022   CHOL 185 03/04/2016 1620   TRIG 65 01/21/2017 1022   HDL 77 01/21/2017 1022   HDL 73 03/04/2016 1620   CHOLHDL 2.6 01/21/2017 1022   VLDL 13 01/21/2017 1022   LDLCALC 108 (H) 01/21/2017 1022   LDLCALC 89 03/04/2016 1620   CBC    Component Value Date/Time   WBC 6.3 01/13/2017 1212   RBC 3.49 (L) 01/13/2017 1212   HGB 10.9 (L) 01/22/2017 2258   HGB 10.0 (L) 09/09/2015 1210   HCT 32.0 (L) 01/22/2017 2258   HCT 28.0 (L) 09/09/2015 1210   PLT 325.0 01/13/2017 1212   PLT 340 09/09/2015 1210   MCV 97.1 01/13/2017 1212   MCV 86 09/09/2015 1210   MCH 31.4 01/12/2017 1151   MCHC 33.2 01/13/2017 1212   RDW 18.1 (H) 01/13/2017 1212   RDW 16.8 (H) 09/09/2015 1210   LYMPHSABS 2.4 01/13/2017 1212   LYMPHSABS 1.9 09/09/2015 1210   MONOABS 0.4 01/13/2017 1212   EOSABS 0.2 01/13/2017 1212   EOSABS 0.5 (H) 09/09/2015 1210   BASOSABS 0.0 01/13/2017 1212   BASOSABS 0.0 09/09/2015 1210    Assessment/Plan:  Counseled patient that Actemra is an IL-6 blocking agent.  Reviewed Actemra dose of 162 mg every other week for patients with a weight of 83 kg.  Counseled patient on purpose, proper use, and adverse effects of Actemra.  Reviewed the most common adverse effects including infections, injection site reaction, bowel injury, and rarely cancer and conditions of the nervous system.  Reviewed that the medication should be held  during infections.  Discussed that there is the possibility of an increased risk of malignancy but it is not well understood if this increased risk is due to the medication or the disease state.  Reviewed the importance of regular labs while on Actemra therapy including the need for routine lipid panel.  Counseled patient that Actemra should be held prior to scheduled surgery.  Counseled patient to avoid live vaccines while on Actemra.  Advised patient to get annual influenza vaccine and the pneumococcal vaccine.  Provided patient with medication education material and answered all questions.  Patient voiced understanding.  Patient consented to Actemra.  Will upload consent into the media tab.  Reviewed storage instructions of Actemra with patient.  Advised it should be kept in the refrigerator until she is ready for use.  Advised patient that initial Actemra injection must be given in the office.  Patient consented to Actemra.  Will apply through her insurance.  Patient is aware that Actemra will replace Xeljanz therapy.     Lilla Shook, Pharm.D., BCPS, CPP Clinical Pharmacist Pager: [phone] Phone: [phone] 04/20/2017 3:31 PM

## 2017-04-21 ENCOUNTER — Other Ambulatory Visit: Payer: Self-pay | Admitting: *Deleted

## 2017-04-21 ENCOUNTER — Encounter: Payer: Self-pay | Admitting: *Deleted

## 2017-04-21 ENCOUNTER — Telehealth: Payer: Self-pay

## 2017-04-21 LAB — COMPLETE METABOLIC PANEL WITH GFR
ALT: 10 U/L (ref 6–29)
AST: 17 U/L (ref 10–35)
Albumin: 3.8 g/dL (ref 3.6–5.1)
Alkaline Phosphatase: 58 U/L (ref 33–130)
BUN: 32 mg/dL — ABNORMAL HIGH (ref 7–25)
CO2: 22 mmol/L (ref 20–31)
Calcium: 9.4 mg/dL (ref 8.6–10.4)
Chloride: 100 mmol/L (ref 98–110)
Creat: 1.21 mg/dL — ABNORMAL HIGH (ref 0.60–0.88)
GFR, Est African American: 47 mL/min — ABNORMAL LOW (ref >=60)
GFR, Est Non African American: 41 mL/min — ABNORMAL LOW (ref >=60)
Glucose, Bld: 111 mg/dL — ABNORMAL HIGH (ref 65–99)
Potassium: 4.4 mmol/L (ref 3.5–5.3)
Sodium: 135 mmol/L (ref 135–146)
Total Bilirubin: 0.3 mg/dL (ref 0.2–1.2)
Total Protein: 7.2 g/dL (ref 6.1–8.1)

## 2017-04-21 NOTE — Patient Outreach (Signed)
[name]) Care Management  04/21/2017  [name] [name] [birth year] [id number]   CSW was able to make contact with patient's daughter, [name] today to follow-up regarding social work services and resources for patient, as well as to ensure that she received the packet of resource information mailed to her home.  Mrs. Surgeon admitted to receiving the packet and has already viewed contents.  Mrs. Surgeon went on to say that she has completed and submitted patient's application for PCS ([place]) to [facility] and it is currently being processed.  Last, Mrs. Surgeon reported that she has an appointment with a representative at the [facility], scheduled for tomorrow (Thursday, June 14th), at which time she is will receive assistance with completion of patient's application for CAPS Forensic scientist).  Mrs. Surgeon has contacted several agencies on the [employer], but reports they are too expensive to pay for services out-of-pocket.  Mrs. Surgeon is hopeful that patient will be approved for CAPS and/or PCS, as they are both funded by Adult Medicaid, as patient is currently an Adult Medicaid recipient.  No additional social work needs identified. CSW will perform a case closure on patient, as all goals of treatment have been met from social work standpoint and no additional social work needs have been identified at this time. CSW will fax an update to patient's Primary Care Physician, Dr. Briscoe Deutscher to ensure that they are aware of CSW's involvement with patient's plan of care.  CSW will submit a case closure request to [name], Care Management Assistant with [facility], in the form of an [employer].   [name], BSW, MSW, LCSW  Licensed Education officer, [facility]   Mailing [place] [address], [place], [place] [address] Physical Address-[address], [place], [place] [address] Toll Free Main # [phone] Fax # [phone] Cell # [phone]  Office # [phone] [name]

## 2017-04-21 NOTE — Progress Notes (Signed)
Review labs

## 2017-04-21 NOTE — Telephone Encounter (Signed)
A prior authorization for Actemra SQ was submitted via cover my meds. Will update once we receive a response.   Courtez Twaddle, [place], CPhT 12:08 PM

## 2017-04-22 NOTE — Telephone Encounter (Addendum)
Received a fax from regarding a prior authorization approval for Actemra from 04/21/17 to 04/22/18.   Reference number:none  Phone number:[phone]  Will send document to scan center.  Venson Ferencz, [place], CPhT  8:47 AM

## 2017-04-23 MED ORDER — TOCILIZUMAB 162 MG/0.9ML ~~LOC~~ SOSY: 162.0000 mg | PREFILLED_SYRINGE | SUBCUTANEOUS | 2 refills | Status: DC

## 2017-04-23 MED FILL — ACTEMRA 162 MG/0.9 ML SYRN: 162 | 28 days supply | Qty: 2 | Fill #0

## 2017-04-23 NOTE — Addendum Note (Signed)
Addended by: Chesley Mires on: 04/23/2017 08:46 AM   Modules accepted: Orders

## 2017-04-23 NOTE — Telephone Encounter (Signed)
I spoke to [facility] regarding Actemra authorization.  Prescription was sent to the pharmacy and will have medication delivered to clinic for clinic administration of first dose.  She voiced understanding.  I advised her to stop [name] one week prior to initiation of Actemra.  She reports her daughter or granddaughter will call to set up time for nurse visit.    Lilla Shook, Pharm.D., BCPS, CPP Clinical Pharmacist Pager: [phone] Phone: [phone] 04/23/2017 8:42 AM

## 2017-04-29 ENCOUNTER — Encounter: Payer: Self-pay | Admitting: Podiatry

## 2017-04-29 ENCOUNTER — Telehealth: Payer: Self-pay

## 2017-04-29 ENCOUNTER — Telehealth: Payer: Self-pay | Admitting: *Deleted

## 2017-04-29 ENCOUNTER — Ambulatory Visit (INDEPENDENT_AMBULATORY_CARE_PROVIDER_SITE_OTHER): Payer: Medicare Other | Admitting: Podiatry

## 2017-04-29 DIAGNOSIS — M79671 Pain in right foot: Secondary | ICD-10-CM

## 2017-04-29 DIAGNOSIS — B351 Tinea unguium: Secondary | ICD-10-CM | POA: Diagnosis not present

## 2017-04-29 DIAGNOSIS — M79672 Pain in left foot: Secondary | ICD-10-CM

## 2017-04-29 DIAGNOSIS — R7989 Other specified abnormal findings of blood chemistry: Secondary | ICD-10-CM

## 2017-04-29 DIAGNOSIS — R944 Abnormal results of kidney function studies: Secondary | ICD-10-CM

## 2017-04-29 NOTE — Telephone Encounter (Signed)
Coordinated delivery of Actemra from [facility]. Medication has been put in the refrigerator.   Capucine Tryon, [place], CPhT 2:06 PM

## 2017-04-29 NOTE — Patient Instructions (Signed)
Seen for hypertrophic nails. All nails debrided. Return in 3 months or as needed.  

## 2017-04-29 NOTE — Progress Notes (Signed)
SUBJECTIVE: 83 y.o. year old female presents requesting toe nails trimmed. Patient uses cane to walk. She had much pain on right anterior shin over rheumatoid nodule that broke off. She bumped into a chair. It is ok now.  She is not able to use Diabetic shoes due to artificial knees.  Stated that her blood sugar is under control.  HPIL Rheumatoid arthritis has been affecting her for the past 4-5 years and fingers and toes are affected. Her last visit to his office was 10/29/16.  OBJECTIVE: DERMATOLOGIC EXAMINATION: Thick dystrophic and hypertrophic nails x 10. Recent history of skin biopsy right lower limb.  VASCULAR EXAMINATION OF LOWER LIMBS: All pedal pulses are palpable with normal pulsation.  No edema or erythema noted.  NEUROLOGIC EXAMINATION OF THE LOWER LIMBS: All epicritic and tactile sensations grossly intact.  MUSCULOSKELETAL EXAMINATION: Severe contracted 3rd digits at PIPJ and DIPJ curling into sulcus areabilateral painful with weight bearing.  Assessment: Onychomycosis x 10.  Hammer toe deformity 3rd bilateral pain with ambulation.  PLAN: All nails debrided. Return in 3 months.

## 2017-04-29 NOTE — Telephone Encounter (Signed)
Received call from patient and her daughter, Elease Hashimoto.  They report patient took last dose of Xeljanz on Tuesday, 04/27/17.  Nurse visit for initial Actemra injection scheduled for Wednesday, 05/05/17 at 10:00 AM.   Lilla Shook, Pharm.D., BCPS, CPP Clinical Pharmacist Pager: [phone] Phone: [phone] 04/29/2017 4:59 PM

## 2017-04-29 NOTE — Telephone Encounter (Signed)
Spoke to [place].  She reports patient is with her other daughter and they will call back to schedule nurse visit.  I reviewed that medication cannot be started until 7 days after patient starts [place].  She voiced understanding.   Lilla Shook, Pharm.D., BCPS, CPP Clinical Pharmacist Pager: [phone] Phone: [phone] 04/29/2017 4:42 PM

## 2017-04-30 NOTE — Telephone Encounter (Signed)
Referral placed for nephrology.

## 2017-05-04 ENCOUNTER — Ambulatory Visit: Payer: Medicare Other

## 2017-05-05 ENCOUNTER — Ambulatory Visit (INDEPENDENT_AMBULATORY_CARE_PROVIDER_SITE_OTHER): Payer: Medicare Other | Admitting: Family Medicine

## 2017-05-05 ENCOUNTER — Ambulatory Visit: Payer: Medicare Other | Admitting: *Deleted

## 2017-05-05 ENCOUNTER — Encounter: Payer: Self-pay | Admitting: Family Medicine

## 2017-05-05 VITALS — BP 144/84 | HR 82 | Temp 98.4°F | Ht 64.0 in | Wt 183.8 lb

## 2017-05-05 VITALS — BP 184/92 | HR 87

## 2017-05-05 DIAGNOSIS — M0579 Rheumatoid arthritis with rheumatoid factor of multiple sites without organ or systems involvement: Secondary | ICD-10-CM

## 2017-05-05 DIAGNOSIS — I2510 Atherosclerotic heart disease of native coronary artery without angina pectoris: Secondary | ICD-10-CM | POA: Diagnosis not present

## 2017-05-05 DIAGNOSIS — D638 Anemia in other chronic diseases classified elsewhere: Secondary | ICD-10-CM | POA: Diagnosis not present

## 2017-05-05 DIAGNOSIS — Z742 Need for assistance at home and no other household member able to render care: Secondary | ICD-10-CM | POA: Diagnosis not present

## 2017-05-05 DIAGNOSIS — R7989 Other specified abnormal findings of blood chemistry: Secondary | ICD-10-CM

## 2017-05-05 DIAGNOSIS — S81801D Unspecified open wound, right lower leg, subsequent encounter: Secondary | ICD-10-CM | POA: Diagnosis not present

## 2017-05-05 MED ORDER — TOCILIZUMAB 162 MG/0.9ML ~~LOC~~ SOSY: 162.0000 mg | PREFILLED_SYRINGE | Freq: Once | SUBCUTANEOUS | Status: DC

## 2017-05-05 MED ORDER — PREDNISONE 5 MG PO TABS: 10.0000 mg | ORAL_TABLET | Freq: Every day | ORAL | 0 refills | Status: DC

## 2017-05-05 MED ORDER — CEFTRIAXONE SODIUM 1 G IJ SOLR
1.0000 g | Freq: Once | INTRAMUSCULAR | Status: AC
  Administered 2017-05-05: 1 g via INTRAMUSCULAR

## 2017-05-05 MED ORDER — DOXYCYCLINE HYCLATE 100 MG PO TABS: 100.0000 mg | ORAL_TABLET | Freq: Two times a day (BID) | ORAL | 0 refills | Status: DC

## 2017-05-05 NOTE — Progress Notes (Signed)
Patient in office for an initial start to Actemra. Patient was previously on [place]. Patient has been off [place] for one week. Patient is not able to start her Actemra today due to a wound on her right leg. Contacted patient's dermatologist and scheduled an appointment for her on 05/10/17 @ 3:30 pm. Patient will also follow up with PCP today in regard to her elevated blood pressure.

## 2017-05-05 NOTE — Progress Notes (Signed)
Pharmacy Note  Subjective:   Patient presents to the clinic for initiation of Actemra.  Patient was previously counseled extensively on Actemra on 04/20/17 and consented to initiation of Actemra at that time.  Patient presents to clinic today to receive the first dose of Actemra.  She confirms she has been off [place] for at least one week.     Objective: CMP     Component Value Date/Time   NA 135 04/20/2017 1614   NA 134 (A) 02/18/2016   K 4.4 04/20/2017 1614   CL 100 04/20/2017 1614   CO2 22 04/20/2017 1614   GLUCOSE 111 (H) 04/20/2017 1614   BUN 32 (H) 04/20/2017 1614   BUN 31 (A) 02/18/2016   CREATININE 1.21 (H) 04/20/2017 1614   CALCIUM 9.4 04/20/2017 1614   PROT 7.2 04/20/2017 1614   PROT 7.2 09/09/2015 1210   ALBUMIN 3.8 04/20/2017 1614   ALBUMIN 3.9 09/09/2015 1210   AST 17 04/20/2017 1614   ALT 10 04/20/2017 1614   ALKPHOS 58 04/20/2017 1614   BILITOT 0.3 04/20/2017 1614   BILITOT 0.5 09/09/2015 1210   GFRNONAA 41 (L) 04/20/2017 1614   GFRAA 47 (L) 04/20/2017 1614   CBC    Component Value Date/Time   WBC 10.7 04/20/2017 1614   RBC 3.45 (L) 04/20/2017 1614   HGB 10.7 (L) 04/20/2017 1614   HGB 10.0 (L) 09/09/2015 1210   HCT 32.1 (L) 04/20/2017 1614   HCT 28.0 (L) 09/09/2015 1210   PLT 356 04/20/2017 1614   PLT 340 09/09/2015 1210   MCV 93.0 04/20/2017 1614   MCV 86 09/09/2015 1210   MCH 31.0 04/20/2017 1614   MCHC 33.3 04/20/2017 1614   RDW 18.3 (H) 04/20/2017 1614   RDW 16.8 (H) 09/09/2015 1210   LYMPHSABS 2,033 04/20/2017 1614   LYMPHSABS 1.9 09/09/2015 1210   MONOABS 214 04/20/2017 1614   EOSABS 107 04/20/2017 1614   EOSABS 0.5 (H) 09/09/2015 1210   BASOSABS 0 04/20/2017 1614   BASOSABS 0.0 09/09/2015 1210   TB Gold: negative (07/03/16)  Assessment/Plan:  Patient was counseled on how to administer subcutaneous Actemra injection using a demonstration pen.  While discussing Actemra with patient, she reports she bumped one of the nodules on her leg last  week and wants it looks at before Actemra is started.  Nodule was evaluated by Dr. Corliss Skains who is concerned about infection.  Patient was advised to see her primary care provider or dermatologist regarding this nodule.  Appointment was made by Dulcy Fanny, LPN for patient to see her dermatologist on 05/10/17.  Decision was made to defer initiation of Actemra at this time due to possible infection.  Medication had been removed from the refrigerator and was room temperature when decision was made to defer initiation.  Unused medication was disposed of as medication cannot be cannot be used more than 8 hours after removing from the refrigerator.  Patient is aware that she has one pen remaining which will be held in the refrigerator until infection is cleared.    Patient was also advised to hold methotrexate at this time.  Patient is currently taking prednisone 10 mg daily and was advised to continue that at this time.  She requested refill of prednisone which was approved by Dr. Corliss Skains.    [name] blood pressure was also elevated today during visit.  Patient confirms she took her blood pressure medications this morning as prescribed.  Patient reports she is in a lot of pain.  Blood  pressure elevations thought to be due to pain and prednisone use.  Patient was advised to follow up with her primary care provider regarding her blood pressure.  She scheduled visit to see their office today.    Lilla Shook, Pharm.D., BCPS Clinical Pharmacist Pager: [phone] Phone: [phone] 05/05/2017 10:36 AM

## 2017-05-05 NOTE — Progress Notes (Signed)
[name] [name] is a 83 y.o. female here for an acute visit.  History of Present Illness:   Water quality scientist, CMA, acting as scribe for Dr. Juleen China.  HPI  Hypertension Home blood pressure readings 841Y - 606T systolic.  Avoiding excessive salt intake? _0   YES  _1   NO Trying to exercise on a regular basis? _2   YES  _3   NO Review: taking medications as instructed, no medication side effects noted, no TIAs, no chest pain on exertion, no dyspnea on exertion, no swelling of ankles.   Wt Readings from Last 3 Encounters:  05/05/17 183 lb 12.8 oz (83.4 kg)  04/20/17 183 lb (83 kg)  03/11/17 179 lb 3.2 oz (81.3 kg)   BP Readings from Last 3 Encounters:  05/05/17 (!) 144/84  05/05/17 (!) 184/92  04/20/17 (!) 148/86   Lab Results  Component Value Date   CREATININE 1.21 (H) 04/20/2017   Skin Infection  Location: Right LE  Onset: acute, gradual and worsening  Duration: unknown to patient, but NOT present at last visit  Associated symptoms: none  Recent treatment: none Functional status affected: no  PMHx, SurgHx, SocialHx, Medications, and Allergies were reviewed in the Visit Navigator and updated as appropriate.  Current Medications:   .  albuterol (PROVENTIL HFA;VENTOLIN HFA) 108 (90 Base) MCG/ACT inhaler, Inhale 1 puff into the lungs every 6 (six) hours as needed for wheezing or shortness of breath., Disp: , Rfl:  .  allopurinol (ZYLOPRIM) 100 MG tablet, take 1 tablet by mouth once daily for GOUT, Disp: 30 tablet, Rfl: 5 .  aspirin EC 81 MG tablet, Take 81 mg by mouth daily., Disp: , Rfl:  .  calcium-vitamin D (OSCAL WITH D) 500-200 MG-UNIT per tablet, Take 1 tablet by mouth daily. , Disp: , Rfl:  .  Cholecalciferol (VITAMIN D-3) 1000 UNITS CAPS, Take 1 capsule by mouth daily., Disp: , Rfl:  .  ferrous sulfate 325 (65 FE) MG tablet, Take 325 mg by mouth daily. , Disp: , Rfl:  .  folic acid (FOLVITE) 1 MG tablet, Take 1 tablet (1 mg total) by mouth daily., Disp: 90 tablet, Rfl:  4 .  furosemide (LASIX) 20 MG tablet, Take 1 tablet (20 mg total) by mouth daily., Disp: 30 tablet, Rfl: 5 .  glucose blood (ONE TOUCH ULTRA TEST) test strip, Check blood sugar once daily DX 250.00, Disp: 100 each, Rfl: 3 .  hydrALAZINE (APRESOLINE) 25 MG tablet, Take 1 tablet (25 mg total) by mouth 4 (four) times daily., Disp: 120 tablet, Rfl: 5 .  hydroxypropyl methylcellulose / hypromellose (ISOPTO TEARS / GONIOVISC) 2.5 % ophthalmic solution, Place 1 drop into both eyes 4 (four) times daily as needed for dry eyes., Disp: , Rfl:  .  ketoconazole (NIZORAL) 2 % cream, Apply 1 application topically 2 (two) times daily. (Patient taking differently: Apply 1 application topically 2 (two) times daily as needed for irritation. ), Disp: 30 g, Rfl: 0 .  labetalol (NORMODYNE) 100 MG tablet, take 2 tablets by mouth twice a day NEED OFFICE VISIT, Disp: 120 tablet, Rfl: 0 .  lisinopril (PRINIVIL,ZESTRIL) 30 MG tablet, Take 1 tablet (30 mg total) by mouth daily., Disp: 30 tablet, Rfl: 5 .  metFORMIN (GLUCOPHAGE) 500 MG tablet, take 1 tablet by mouth once daily for diabetes, Disp: 90 tablet, Rfl: 1 .  methotrexate (RHEUMATREX) 2.5 MG tablet, Take 4 tablets (10 mg total) by mouth once a week. Caution:Chemotherapy. Protect from light., Disp: 16 tablet, Rfl: 1 .  mupirocin ointment (BACTROBAN) 2 %, Place 1 application into the nose 2 (two) times daily., Disp: 22 g, Rfl: 0 .  OVER THE COUNTER MEDICATION, Take 1 tablet by mouth 2 (two) times daily. Wellness formula for immune support, Disp: , Rfl:  .  polyvinyl alcohol (LIQUIFILM TEARS) 1.4 % ophthalmic solution, Place 1 drop into both eyes as needed (for dry eyes)., Disp: , Rfl:  .  predniSONE (DELTASONE) 5 MG tablet, Take 2 tablets (10 mg total) by mouth daily with breakfast., Disp: 60 tablet, Rfl: 0 .  Tocilizumab (ACTEMRA) 162 MG/0.9ML SOSY, Inject 162 mg into the skin every 14 (fourteen) days., Disp: 2 Syringe, Rfl: 2 .  traMADol (ULTRAM) 50 MG tablet, Take 1-2  tablets every 6 hours as needed for pain, Disp: 240 tablet, Rfl: 5 .  triamcinolone cream (KENALOG) 0.1 %, apply to affected area twice a day if needed for IRRITATION, Disp: 45 g, Rfl: 2 .  vitamin C (ASCORBIC ACID) 500 MG tablet, Take 500 mg by mouth daily., Disp: , Rfl:    Allergies  Allergen Reactions  . Clonidine Derivatives Swelling    [name].    . Fish Allergy Swelling  . Shellfish Allergy Hives  . Doxycycline Rash  . Indomethacin Other (See Comments)    unknown  . Lyrica [Pregabalin]     hallucinations  . Methyldopa Other (See Comments)    unknown  . Cetirizine Hcl Itching and Rash    unknown  . Codeine Itching    unknown  . Levaquin [Levofloxacin In D5w] Rash  . Tomato Rash   Review of Systems:   Review of Systems  Constitutional: Negative for chills, fever, malaise/fatigue and weight loss.  Eyes: Negative for blurred vision and double vision.  Respiratory: Negative for cough, shortness of breath and wheezing.   Cardiovascular: Negative for chest pain, palpitations and leg swelling.  Gastrointestinal: Negative for abdominal pain, constipation, diarrhea, nausea and vomiting.  Genitourinary: Negative for dysuria and urgency.  Musculoskeletal: Positive for joint pain. Negative for myalgias.  Neurological: Negative for dizziness, tingling, sensory change, speech change, focal weakness and headaches.  Psychiatric/Behavioral: Negative for depression, substance abuse and suicidal ideas. The patient is not nervous/anxious.    Vitals:   Vitals:   05/05/17 1551  BP: (!) 144/84  Pulse: 82  Temp: 98.4 F (36.9 C)  TempSrc: Oral  SpO2: 97%  Weight: 183 lb 12.8 oz (83.4 kg)  Height: _0  (1.626 m)     Body mass index is 31.55 kg/m.  Physical Exam:   Physical Exam  Constitutional: She appears well-developed and well-nourished. No distress.  HENT:  Head: Normocephalic and atraumatic.  Eyes:  EOM are normal. Pupils are equal, round, and reactive to light.  Neck: Normal range of motion. Neck supple.  Cardiovascular: Normal rate, regular rhythm and intact distal pulses.   Pulmonary/Chest: Effort normal.  Abdominal: Soft.  Psychiatric: She has a normal mood and affect. Her behavior is normal.  Nursing note and vitals reviewed.  Skin: LESION SIZE/LOCATION: RLE with three discrete RA  Nodeules, two are now ulcerated. One is draining purulent yellow pus. LESION DESCRIPTION: discharge and ulceration. ASSOCIATED SIGNS: none.   Results for orders placed or performed in visit on 04/20/17  CBC with Differential/Platelet  Result Value Ref Range   WBC 10.7 3.8 - 10.8 K/uL   RBC 3.45 (L) 3.80 - 5.10 MIL/uL   Hemoglobin 10.7 (L) 11.7 - 15.5 g/dL   HCT 32.1 (  L) 35.0 - 45.0 %   MCV 93.0 80.0 - 100.0 fL   MCH 31.0 27.0 - 33.0 pg   MCHC 33.3 32.0 - 36.0 g/dL   RDW 18.3 (H) 11.0 - 15.0 %   Platelets 356 140 - 400 K/uL   MPV 8.5 7.5 - 12.5 fL   Neutro Abs 8,346 (H) 1,500 - 7,800 cells/uL   Lymphs Abs 2,033 850 - 3,900 cells/uL   Monocytes Absolute 214 200 - 950 cells/uL   Eosinophils Absolute 107 15 - 500 cells/uL   Basophils Absolute 0 0 - 200 cells/uL   Neutrophils Relative % 78 %   Lymphocytes Relative 19 %   Monocytes Relative 2 %   Eosinophils Relative 1 %   Basophils Relative 0 %   Smear Review Criteria for review not met   COMPLETE METABOLIC PANEL WITH GFR  Result Value Ref Range   Sodium 135 135 - 146 mmol/L   Potassium 4.4 3.5 - 5.3 mmol/L   Chloride 100 98 - 110 mmol/L   CO2 22 20 - 31 mmol/L   Glucose, Bld 111 (H) 65 - 99 mg/dL   BUN 32 (H) 7 - 25 mg/dL   Creat 1.21 (H) 0.60 - 0.88 mg/dL   Total Bilirubin 0.3 0.2 - 1.2 mg/dL   Alkaline Phosphatase 58 33 - 130 U/L   AST 17 10 - 35 U/L   ALT 10 6 - 29 U/L   Total Protein 7.2 6.1 - 8.1 g/dL   Albumin 3.8 3.6 - 5.1 g/dL   Calcium 9.4 8.6 - 10.4 mg/dL   GFR, Est African American 47 (L) >=60 mL/min   GFR, Est Non  African American 41 (L) >=60 mL/min    Assessment and Plan:   Jerrilyn was seen today for acute visit and hypertension.  Diagnoses and all orders for this visit:  Wound of right lower extremity, subsequent encounter Comments: Rx Doxycycline initiated at time of visit. The next day, the patient developed a drug eruption, so changed to Keflex. Culture grew E.coli. Wound care referral if Home Health unable to manage.   Result Value   Culture Abundant ESCHERICHIA COLI   Gram Stain Abundant   Gram Stain WBC present-predominately PMN   Gram Stain Rare Squamous Epithelial Cells Present   Gram Stain Abundant Gram Negative Rods   Gram Stain Abundant Gram Positive Cocci In Pairs   Gram Stain Few Gram Positive Rods   Organism ID, Bacteria ESCHERICHIA COLI      AMPICILLIN <=2 Sensitive    AMOX/CLAVULANIC <=2 Sensitive    AMPICILLIN/SULBACTAM <=2 Sensitive    PIP/TAZO <=4 Sensitive    IMIPENEM <=0.25 Sensitive    CEFAZOLIN <=4 Not Reportable    CEFTRIAXONE <=1 Sensitive    CEFTAZIDIME <=1 Sensitive    CEFEPIME <=1 Sensitive    GENTAMICIN <=1 Sensitive    TOBRAMYCIN <=1 Sensitive    CIPROFLOXACIN <=0.25 Sensitive    LEVOFLOXACIN <=0.12 Sensitive    TRIMETH/SULFA* <=20 Sensitive   Orders: -     doxycycline (VIBRA-TABS) 100 MG tablet; Take 1 tablet (100 mg total) by mouth 2 (two) times daily. (DISCONTINUED 6/28) -     cefTRIAXone (ROCEPHIN) injection 1 g; Inject 1 g into the muscle once. -     Wound culture  Need for home health care Comments: Patient still needs extra help at home. HH attempted to call her daughter that lives locally, but unable to reach. Will refer again - to call other daughter.   Elevated serum  creatinine Comments: Referral to Renal IF repeat continues to be elevated since last several have been WNL. Avoid NEPHROTOXIC medications. Patient on ACE.  Lab Results  Component Value Date   CREATININE 1.21 (H) 04/20/2017   CREATININE 0.90 01/22/2017   CREATININE 0.99  01/12/2017   Orders: -     Comprehensive metabolic panel -     Phosphorus -     Magnesium  Anemia of chronic disease Comments: As above. Will add future labs to be checked as above.  Lab Results  Component Value Date   WBC 10.7 04/20/2017   HGB 10.7 (L) 04/20/2017   HCT 32.1 (L) 04/20/2017   MCV 93.0 04/20/2017   PLT 356 04/20/2017   Orders: -     Ferritin -     IBC panel -     CBC with Differential/Platelet   . Reviewed expectations re: course of current medical issues. . Discussed self-management of symptoms. . Outlined signs and symptoms indicating need for more acute intervention. . Patient verbalized understanding and all questions were answered. Marland Kitchen Health Maintenance issues including appropriate healthy diet, exercise, and smoking avoidance were discussed with patient. . See orders for this visit as documented in the electronic medical record. . Patient received an After Visit Summary.  CMA served as Education administrator during this visit. History, Physical, and Plan performed by medical provider. The above documentation has been reviewed and is accurate and complete. Briscoe Deutscher, D.O.  Briscoe Deutscher, DO [place] [place] 05/08/2017  Future Appointments Date Time Provider [place]  07/21/2017 1:45 PM Bo Merino, MD [facility] None  07/29/2017 1:45 PM Camelia Phenes, DPM [facility]  09/13/2017 1:00 PM Briscoe Deutscher, DO [facility] None

## 2017-05-07 ENCOUNTER — Other Ambulatory Visit: Payer: Self-pay

## 2017-05-07 ENCOUNTER — Telehealth: Payer: Self-pay | Admitting: Family Medicine

## 2017-05-07 MED ORDER — CEPHALEXIN 500 MG PO CAPS: 500.0000 mg | ORAL_CAPSULE | Freq: Four times a day (QID) | ORAL | 0 refills | Status: AC

## 2017-05-07 NOTE — Telephone Encounter (Signed)
Spoke with patient's daughter and advised to stop doxycycline and start Keflex 500 mg QID x7 days.  Verbalized understanding.

## 2017-05-07 NOTE — Telephone Encounter (Signed)
Patient Name: [name] [name] DOB: [birth year] Initial Comment Caller states her mother is having rash after antibiotics Nurse Assessment Nurse: Katrina Stack, RN, Dahlia Client Date/Time (Eastern Time): 05/07/2017 10:49:11 AM Confirm and document reason for call. If symptomatic, describe symptoms. ---Caller states her mom has a rash from an antibiotic. She is taking a certain antibiotic that she can't remember the name of but she reacted to it in the past. She would like to change the medication. Does the patient have any new or worsening symptoms? ---Yes Will a triage be completed? ---No Select reason for no triage. ---Patient declined Please document clinical information provided and list any resource used. ---Daughter stated she wants to change the antibiotic and then see if the rash clears up. Will inform office and fax information to office for MD to review. Guidelines

## 2017-05-07 NOTE — Telephone Encounter (Signed)
Please advise 

## 2017-05-07 NOTE — Telephone Encounter (Signed)
Stop Doxy. Keflex 500 QID x 7 days.

## 2017-05-08 LAB — WOUND CULTURE

## 2017-05-10 ENCOUNTER — Other Ambulatory Visit: Payer: Self-pay | Admitting: Dermatology

## 2017-05-10 ENCOUNTER — Telehealth: Payer: Self-pay | Admitting: *Deleted

## 2017-05-10 NOTE — Telephone Encounter (Signed)
Spoke with patient's daughter.  She states patient is feeling better today.  Put some "antibiotic cream" on the rash and it feels better.  Advised daughter that symptoms are not consistent with a drug rash and that it sounds like patient may have the shingles.  Daughter states the rash is only painful when clothing or something rubs up against the rash.  Only on one side of torso.  Advised again that it sounds like patient has the shingles and Dr. Earlene Plater would like to see her today.  Daughter stated patient had a similar rash a few months ago and was "tested for shingles and it was negative".  Advised patient's daughter that there is no test for shingles and that patient needs to come in to be examined.  Daughter states patient has an appointment with dermatology today for the wounds on her legs.  Asked daughter to please have dermatology look at the rash.  If it is shingles, they can treat or Dr. Earlene Plater will be happy to treat.  It is difficult for patient to get around, so daughter only wants to take her to one appointment.  Asked daughter to be sure dermatology looks at the rash and let our office know if they need further assistance.  Daughter verbalized understanding to all.

## 2017-05-10 NOTE — Telephone Encounter (Signed)
[name] called in Sunday, 05/09/17 stating mom is experiencing rash. Daughter thinks it may be related to Keflex. Please advise on what advice to call patient with.   --------------------------------------------------------- PLEASE NOTE: All timestamps contained within this report are represented as [place] Standard Time. CONFIDENTIALTY NOTICE: This fax transmission is intended only for the addressee. It contains information that is legally privileged, confidential or otherwise protected from use or disclosure. If you are not the intended recipient, you are strictly prohibited from reviewing, disclosing, copying using or disseminating any of this information or taking any action in reliance on or regarding this information. If you have received this fax in error, please notify us immediately by telephone so that we can arrange for its return to [place]. Phone: [phone], Toll-Free: [phone], Fax: [phone] Page: 1 of 2 Call Id: [id number] [facility] [place] TELEPHONE ADVICE RECORD [facility] Patient Name: [name] [name] Gender: Female DOB: 1929/11/13 Age: 83 Y 1 M 5 D Return Phone Number: [phone] (Primary), [phone] (Secondary) City/State/Zip: [place] [address] Client [facility] Client Site [facility] Physician Helane Rima- DO Who Is Calling Patient / Member / Family / Caregiver Call Type Triage / Clinical Caller Name [name] Relationship To Patient Daughter Return Phone Number [phone] (Secondary) Chief Complaint Medication reaction Reason for Call Symptomatic / Request for Health Information Initial Comment Caller states mothers antibiotic is causing a rash, second one she's had a reaction to. She is wondering how to stop it. Medication: Cethalexin 4 times daily, don't remember the mg. Appointment Disposition EMR  Appointment Attempted - Not Scheduled Info pasted into Epic No Nurse Assessment Nurse: Mills-Hernandez, RN, Harriett Sine Date/Time (Eastern Time): 05/09/2017 11:24:21 AM Confirm and document reason for call. If symptomatic, describe symptoms. ---Caller states mothers antibiotic is causing a rash, second one she's had a reaction to. She is wondering how to stop it. Medication: Cethalexin 4 times daily, don't remember the mg. Started the ABX Friday evening. Rash is on her left side and wraps around to the back. It is painful. Does the PT have any chronic conditions? (i.e. diabetes, asthma, etc.) ---Yes List chronic conditions. ---HTN, diabetes Guidelines Guideline Title Affirmed Question Shingles SEVERE pain (e.g., excruciating) Disp. Time Lamount Cohen Time) Disposition Final User 05/09/2017 11:34:09 AM See Physician within 24 Hours Yes Mills-Hernandez, RN, Harriett Sine Referrals REFERRED TO PCP OFFICE Care Advice Given Per Guideline SEE PHYSICIAN WITHIN 24 HOURS: * IF OFFICE WILL BE OPEN: You need to be seen within the next 24 hours. Call your doctor when the office opens, and make an appointment. DON'T SCRATCH: * Try not to scratch. * Itching is often worsened by scratching (the 'Itch-Scratch' cycle). * Avoid contact with pregnant women. Avoid contact with anyone who has a weakened immune system (immunocompromised) (e.g., HIV positive, cancer chemotherapy, chronic steroid treatment, splenectomy, organ transplant). PLEASE NOTE: All timestamps contained within this report are represented as [place] Standard Time. CONFIDENTIALTY NOTICE: This fax transmission is intended only for the addressee. It contains information that is legally privileged, confidential or otherwise protected from use or disclosure. If you are not the intended recipient, you are strictly prohibited from reviewing, disclosing, copying using or disseminating any of this information or taking any action in reliance on or regarding this  information. If you have received this fax in error, please notify us immediately by telephone so that we can arrange for its return to [place]. Phone:  [phone], Toll-Free: [phone], Fax: [phone] Page: 2 of 2 Call Id: [id number] Care Advice Given Per Guideline CALL BACK IF: * Fever over 100.5 F (38.1 C) * You become worse. CARE ADVICE given per Shingles (Adult) guideline. PAIN MEDICINES: ACETAMINOPHEN (E.G., TYLENOL): IBUPROFEN (E.G., MOTRIN, ADVIL): NAPROXEN (E.G., ALEVE):

## 2017-05-10 NOTE — Telephone Encounter (Signed)
Sounds like Shingles, not drug eruption. I want to see her.

## 2017-05-10 NOTE — Telephone Encounter (Signed)
Please advise if you would like to change antibiotic.

## 2017-05-11 ENCOUNTER — Other Ambulatory Visit: Payer: Self-pay

## 2017-05-11 ENCOUNTER — Telehealth: Payer: Self-pay | Admitting: Family Medicine

## 2017-05-11 ENCOUNTER — Telehealth: Payer: Self-pay | Admitting: Rheumatology

## 2017-05-11 MED ORDER — LABETALOL HCL 100 MG PO TABS: ORAL_TABLET | ORAL | 0 refills | Status: DC

## 2017-05-11 NOTE — Telephone Encounter (Signed)
Refill sent to patients pharmacy. 

## 2017-05-11 NOTE — Telephone Encounter (Signed)
Patient is off her RA medication because of an infection.  [name] is wanting to know if Dr. Corliss Skains could prescribe her some Prednisone or what can they do to provide comfort for her.

## 2017-05-11 NOTE — Telephone Encounter (Signed)
Please call daughter. She would like to update Dr. Earlene Plater on what is going on with patient.

## 2017-05-11 NOTE — Telephone Encounter (Signed)
She has infection on her right lower extremity. She cannot take any other immunosuppressants at this time. She will need clearance from her dermatologist after her lesions heal. She can continue prednisone at 10 mg by mouth daily

## 2017-05-11 NOTE — Telephone Encounter (Signed)
[name] states the patient was having a rough morning. Patient was in increased pain and swelling. Having trouble moving and unable to get herself up to the bathroom. It's worse in her hands. Patient is currently on Prednisone 10 mg. Please advise.

## 2017-05-11 NOTE — Telephone Encounter (Signed)
Pt needs a refill on lebetalol called into [facility] on [address]

## 2017-05-11 NOTE — Telephone Encounter (Signed)
LM for patients daughter to return call  

## 2017-05-13 ENCOUNTER — Telehealth: Payer: Self-pay | Admitting: Rheumatology

## 2017-05-13 NOTE — Telephone Encounter (Signed)
[name] returning your call.

## 2017-05-13 NOTE — Telephone Encounter (Signed)
See previous phone note.  

## 2017-05-13 NOTE — Telephone Encounter (Signed)
Attempted to contact the the patient's daughter to discuss recommendation.Left message for patient to call the office.

## 2017-05-13 NOTE — Telephone Encounter (Signed)
[name] advised patient should continue the Prednisone 10 mg. She verbalized understanding.

## 2017-05-14 NOTE — Telephone Encounter (Signed)
LM for patients daughter to return call  

## 2017-05-15 ENCOUNTER — Ambulatory Visit (HOSPITAL_COMMUNITY)
Admission: EM | Admit: 2017-05-15 | Discharge: 2017-05-15 | Disposition: A | Discharge status: Home / Self Care | Payer: Medicare Other | Attending: Family Medicine | Admitting: Family Medicine

## 2017-05-15 ENCOUNTER — Encounter (HOSPITAL_COMMUNITY): Payer: Self-pay | Admitting: Emergency Medicine

## 2017-05-15 DIAGNOSIS — S81801A Unspecified open wound, right lower leg, initial encounter: Secondary | ICD-10-CM | POA: Diagnosis not present

## 2017-05-15 DIAGNOSIS — Z5189 Encounter for other specified aftercare: Secondary | ICD-10-CM

## 2017-05-15 MED ORDER — SULFAMETHOXAZOLE-TRIMETHOPRIM 800-160 MG PO TABS: 1.0000 | ORAL_TABLET | Freq: Two times a day (BID) | ORAL | 0 refills | Status: DC

## 2017-05-15 NOTE — Discharge Instructions (Addendum)
Today you were diagnosed with the following: 1. Visit for wound check    You have been prescribed prescription medications this visit. Finish the antibiotic you are currently taking also.  Meds ordered this encounter  Medications   cephALEXin (KEFLEX) 500 MG capsule    Sig: Take 500 mg by mouth 4 (four) times daily.   sulfamethoxazole-trimethoprim (BACTRIM DS,SEPTRA DS) 800-160 MG tablet    Sig: Take 1 tablet by mouth 2 (two) times daily.    Dispense:  20 tablet    Refill:  0   Recommend follow up in 48 hours for wound check. Please take all medications as directed.  If you are not improving over the next few days or feel you are worsening please follow up here or the Emergency Department if you are unable to see your regular doctor.

## 2017-05-15 NOTE — ED Notes (Signed)
Applied saline soaked guaze to lower shin wound

## 2017-05-15 NOTE — ED Triage Notes (Addendum)
Wound on right shin x 2 wounds  for a year, healed, but 2 weeks ago it turned into a sore.  Dermatologist did a biopsy on Monday 7/2 and saw both wounds.  Patient has been on antibiotics prescribed by pcp for a week.  Wounds are looking worse today  Wound on lower right leg has green tissue and odor

## 2017-05-15 NOTE — ED Provider Notes (Signed)
  [facility]   [id number] 05/15/17 Arrival Time: 1331  ASSESSMENT & PLAN:  Today you were diagnosed with the following: 1. Visit for wound check    You have been prescribed prescription medications this visit. Finish the antibiotic you are currently taking also.  Meds ordered this encounter  Medications  . cephALEXin (KEFLEX) 500 MG capsule    Sig: Take 500 mg by mouth 4 (four) times daily.  Marland Kitchen sulfamethoxazole-trimethoprim (BACTRIM DS,SEPTRA DS) 800-160 MG tablet    Sig: Take 1 tablet by mouth 2 (two) times daily.    Dispense:  20 tablet    Refill:  0   Recommend follow up in 48 hours for wound check. Please take all medications as directed.  If you are not improving over the next few days or feel you are worsening please follow up here or the Emergency Department if you are unable to see your regular doctor.  Your blood pressure was noted to be elevated during your visit today. You may return here within the next few days or when you are well to recheck if unable to see your primary care doctor.  Reviewed expectations re: course of current medical issues. Questions answered. Outlined signs and symptoms indicating need for more acute intervention. Patient verbalized understanding. After Visit Summary given.   SUBJECTIVE:  [name] [name] is a 83 y.o. female who presents with complaint of infection to wound of R lower leg. Biopsy done here approx 5 days ago. Has noticed increased discomfort and yellow drainage since yesterday. Afebrile. Ambulatory. Has been regularly cleaning wound gently. Taking Keflex.  ROS: As per HPI.   OBJECTIVE:  Vitals:   05/15/17 1436  BP: (!) 167/78  Pulse: 85  Resp: (!) 22  Temp: 98.6 F (37 C)  TempSrc: Oral     General appearance: alert; no distress Extremities: extremities normal, atraumatic, no cyanosis or edema Skin: warm and dry; R lower leg just above medial ankle with approx 1.5 cm circular wound with yellow drainage;  tender; no fluctuance; no bleeding Neurologic: alert and oriented X 3; normal symmetric reflexes; no bleeding   Allergies  Allergen Reactions  . Clonidine Derivatives Swelling    [name].    . Fish Allergy Swelling  . Shellfish Allergy Hives  . Doxycycline Rash  . Indomethacin Other (See Comments)    unknown  . Lyrica [Pregabalin]     hallucinations  . Methyldopa Other (See Comments)    unknown  . Cetirizine Hcl Itching and Rash    unknown  . Codeine Itching    unknown  . Levaquin [Levofloxacin In D5w] Rash  . Tomato Rash    PMHx, SurgHx, SocialHx, Medications, and Allergies were reviewed in the Visit Navigator and updated as appropriate.      Mardella Layman, MD 05/15/17 [phone]

## 2017-05-17 ENCOUNTER — Telehealth: Payer: Self-pay | Admitting: Rheumatology

## 2017-05-17 NOTE — Telephone Encounter (Signed)
[name] would like to talk to you.

## 2017-05-17 NOTE — Telephone Encounter (Signed)
Have left several messages with no return call.  Closing encounter.  Will be glad to assist patient/ daughter if they contact us again.

## 2017-05-17 NOTE — Telephone Encounter (Signed)
LM for daughter to return call

## 2017-05-18 NOTE — Telephone Encounter (Signed)
Patient was seen in urgent care over the weekend because the lesion on her leg. There was drainage and pus. The daughter states the drainage was greenish and had an odor. Patient was placed on another course of antibiotics. Advised patient's daughter to complete antibiotics as prescribed and follow up with PCP and dermatologist. Will need clearance to start Actemra. Patient' wound culture on 05/05/17 should E. Coli.

## 2017-05-18 NOTE — Telephone Encounter (Signed)
I agree. She cannot start any immunosuppressive agents until her lesions completely heal.

## 2017-05-24 ENCOUNTER — Telehealth: Payer: Self-pay | Admitting: Family Medicine

## 2017-05-24 NOTE — Telephone Encounter (Signed)
Patient has been on antibiotics for about a week. States that the sores and knots on leg are still draining.Daughter states they are looking a little better. She also wonders if mother should go back to see dermatologist for this issue.   She states that   Her Mother is desperately needing to get back on the RA medicine(methotrexate) because of the interaction with the antibiotic. She is currently in a lot of pain. Daughter wonders if she needs to come back in and be seen. Please advise.

## 2017-05-24 NOTE — Telephone Encounter (Signed)
Clarify for me - did she see the Dermatologist last week? The infection has to be gone before the RA med can be restarted because the med makes her immune system not work well. Okay to see me OR Derm.

## 2017-05-25 ENCOUNTER — Telehealth: Payer: Self-pay

## 2017-05-25 ENCOUNTER — Emergency Department (HOSPITAL_COMMUNITY): Payer: Medicare Other

## 2017-05-25 ENCOUNTER — Inpatient Hospital Stay (HOSPITAL_COMMUNITY)
Admission: EM | Admit: 2017-05-25 | Discharge: 2017-05-27 | DRG: 871 | Disposition: A | Discharge status: Home Health | Payer: Medicare Other | Attending: Family Medicine | Admitting: Family Medicine

## 2017-05-25 ENCOUNTER — Encounter (HOSPITAL_COMMUNITY): Payer: Self-pay

## 2017-05-25 DIAGNOSIS — Z8673 Personal history of transient ischemic attack (TIA), and cerebral infarction without residual deficits: Secondary | ICD-10-CM

## 2017-05-25 DIAGNOSIS — Z79899 Other long term (current) drug therapy: Secondary | ICD-10-CM

## 2017-05-25 DIAGNOSIS — R630 Anorexia: Secondary | ICD-10-CM | POA: Diagnosis present

## 2017-05-25 DIAGNOSIS — N183 Chronic kidney disease, stage 3 unspecified: Secondary | ICD-10-CM | POA: Diagnosis present

## 2017-05-25 DIAGNOSIS — M069 Rheumatoid arthritis, unspecified: Secondary | ICD-10-CM | POA: Diagnosis present

## 2017-05-25 DIAGNOSIS — Z7982 Long term (current) use of aspirin: Secondary | ICD-10-CM

## 2017-05-25 DIAGNOSIS — Z7952 Long term (current) use of systemic steroids: Secondary | ICD-10-CM

## 2017-05-25 DIAGNOSIS — A419 Sepsis, unspecified organism: Secondary | ICD-10-CM | POA: Diagnosis not present

## 2017-05-25 DIAGNOSIS — M0609 Rheumatoid arthritis without rheumatoid factor, multiple sites: Secondary | ICD-10-CM | POA: Diagnosis not present

## 2017-05-25 DIAGNOSIS — E1121 Type 2 diabetes mellitus with diabetic nephropathy: Secondary | ICD-10-CM | POA: Diagnosis present

## 2017-05-25 DIAGNOSIS — D638 Anemia in other chronic diseases classified elsewhere: Secondary | ICD-10-CM | POA: Diagnosis present

## 2017-05-25 DIAGNOSIS — Z96653 Presence of artificial knee joint, bilateral: Secondary | ICD-10-CM | POA: Diagnosis present

## 2017-05-25 DIAGNOSIS — Z961 Presence of intraocular lens: Secondary | ICD-10-CM | POA: Diagnosis present

## 2017-05-25 DIAGNOSIS — E860 Dehydration: Secondary | ICD-10-CM | POA: Diagnosis present

## 2017-05-25 DIAGNOSIS — I10 Essential (primary) hypertension: Secondary | ICD-10-CM | POA: Diagnosis not present

## 2017-05-25 DIAGNOSIS — I509 Heart failure, unspecified: Secondary | ICD-10-CM | POA: Diagnosis present

## 2017-05-25 DIAGNOSIS — Z91013 Allergy to seafood: Secondary | ICD-10-CM

## 2017-05-25 DIAGNOSIS — E1129 Type 2 diabetes mellitus with other diabetic kidney complication: Secondary | ICD-10-CM | POA: Diagnosis present

## 2017-05-25 DIAGNOSIS — I152 Hypertension secondary to endocrine disorders: Secondary | ICD-10-CM | POA: Diagnosis present

## 2017-05-25 DIAGNOSIS — Z66 Do not resuscitate: Secondary | ICD-10-CM | POA: Diagnosis present

## 2017-05-25 DIAGNOSIS — M1A09X0 Idiopathic chronic gout, multiple sites, without tophus (tophi): Secondary | ICD-10-CM | POA: Diagnosis present

## 2017-05-25 DIAGNOSIS — G8929 Other chronic pain: Secondary | ICD-10-CM | POA: Diagnosis present

## 2017-05-25 DIAGNOSIS — E1152 Type 2 diabetes mellitus with diabetic peripheral angiopathy with gangrene: Secondary | ICD-10-CM | POA: Diagnosis present

## 2017-05-25 DIAGNOSIS — Z9842 Cataract extraction status, left eye: Secondary | ICD-10-CM

## 2017-05-25 DIAGNOSIS — M797 Fibromyalgia: Secondary | ICD-10-CM | POA: Diagnosis present

## 2017-05-25 DIAGNOSIS — E785 Hyperlipidemia, unspecified: Secondary | ICD-10-CM

## 2017-05-25 DIAGNOSIS — I252 Old myocardial infarction: Secondary | ICD-10-CM

## 2017-05-25 DIAGNOSIS — L03115 Cellulitis of right lower limb: Secondary | ICD-10-CM | POA: Diagnosis not present

## 2017-05-25 DIAGNOSIS — E1169 Type 2 diabetes mellitus with other specified complication: Secondary | ICD-10-CM | POA: Diagnosis present

## 2017-05-25 DIAGNOSIS — E1122 Type 2 diabetes mellitus with diabetic chronic kidney disease: Secondary | ICD-10-CM | POA: Diagnosis not present

## 2017-05-25 DIAGNOSIS — Z7984 Long term (current) use of oral hypoglycemic drugs: Secondary | ICD-10-CM

## 2017-05-25 DIAGNOSIS — I2510 Atherosclerotic heart disease of native coronary artery without angina pectoris: Secondary | ICD-10-CM | POA: Diagnosis present

## 2017-05-25 DIAGNOSIS — E1159 Type 2 diabetes mellitus with other circulatory complications: Secondary | ICD-10-CM | POA: Diagnosis present

## 2017-05-25 DIAGNOSIS — Z90710 Acquired absence of both cervix and uterus: Secondary | ICD-10-CM

## 2017-05-25 DIAGNOSIS — Z881 Allergy status to other antibiotic agents status: Secondary | ICD-10-CM

## 2017-05-25 DIAGNOSIS — T370X5A Adverse effect of sulfonamides, initial encounter: Secondary | ICD-10-CM | POA: Diagnosis present

## 2017-05-25 DIAGNOSIS — I130 Hypertensive heart and chronic kidney disease with heart failure and stage 1 through stage 4 chronic kidney disease, or unspecified chronic kidney disease: Secondary | ICD-10-CM | POA: Diagnosis present

## 2017-05-25 DIAGNOSIS — Z9841 Cataract extraction status, right eye: Secondary | ICD-10-CM

## 2017-05-25 DIAGNOSIS — Z91018 Allergy to other foods: Secondary | ICD-10-CM

## 2017-05-25 DIAGNOSIS — N170 Acute kidney failure with tubular necrosis: Secondary | ICD-10-CM | POA: Diagnosis present

## 2017-05-25 DIAGNOSIS — E872 Acidosis: Secondary | ICD-10-CM | POA: Diagnosis present

## 2017-05-25 LAB — COMPREHENSIVE METABOLIC PANEL
ALT: 14 U/L (ref 14–54)
AST: 21 U/L (ref 15–41)
Albumin: 3.6 g/dL (ref 3.5–5.0)
Alkaline Phosphatase: 53 U/L (ref 38–126)
Anion gap: 11 (ref 5–15)
BUN: 32 mg/dL — ABNORMAL HIGH (ref 6–20)
CO2: 21 mmol/L — ABNORMAL LOW (ref 22–32)
Calcium: 9.7 mg/dL (ref 8.9–10.3)
Chloride: 100 mmol/L — ABNORMAL LOW (ref 101–111)
Creatinine, Ser: 1.87 mg/dL — ABNORMAL HIGH (ref 0.44–1.00)
GFR calc Af Amer: 27 mL/min — ABNORMAL LOW (ref >60)
GFR calc non Af Amer: 23 mL/min — ABNORMAL LOW (ref >60)
Glucose, Bld: 114 mg/dL — ABNORMAL HIGH (ref 65–99)
Potassium: 4.7 mmol/L (ref 3.5–5.1)
Sodium: 132 mmol/L — ABNORMAL LOW (ref 135–145)
Total Bilirubin: 0.5 mg/dL (ref 0.3–1.2)
Total Protein: 7.1 g/dL (ref 6.5–8.1)

## 2017-05-25 LAB — CBC WITH DIFFERENTIAL/PLATELET
Basophils Absolute: 0 10*3/uL (ref 0.0–0.1)
Basophils Relative: 0 %
Eosinophils Absolute: 0.1 10*3/uL (ref 0.0–0.7)
Eosinophils Relative: 1 %
HCT: 33.7 % — ABNORMAL LOW (ref 36.0–46.0)
Hemoglobin: 11.3 g/dL — ABNORMAL LOW (ref 12.0–15.0)
Lymphocytes Relative: 12 %
Lymphs Abs: 1.6 10*3/uL (ref 0.7–4.0)
MCH: 31 pg (ref 26.0–34.0)
MCHC: 33.5 g/dL (ref 30.0–36.0)
MCV: 92.6 fL (ref 78.0–100.0)
Monocytes Absolute: 0.8 10*3/uL (ref 0.1–1.0)
Monocytes Relative: 6 %
Neutro Abs: 10.6 10*3/uL — ABNORMAL HIGH (ref 1.7–7.7)
Neutrophils Relative %: 81 %
Platelets: 429 10*3/uL — ABNORMAL HIGH (ref 150–400)
RBC: 3.64 MIL/uL — ABNORMAL LOW (ref 3.87–5.11)
RDW: 18.1 % — ABNORMAL HIGH (ref 11.5–15.5)
WBC: 13.2 10*3/uL — ABNORMAL HIGH (ref 4.0–10.5)

## 2017-05-25 LAB — URINALYSIS, MICROSCOPIC (REFLEX): RBC / HPF: NONE SEEN RBC/hpf (ref 0–5)

## 2017-05-25 LAB — GLUCOSE, CAPILLARY
Glucose-Capillary: 121 mg/dL — ABNORMAL HIGH (ref 65–99)
Glucose-Capillary: 158 mg/dL — ABNORMAL HIGH (ref 65–99)

## 2017-05-25 LAB — URINALYSIS, ROUTINE W REFLEX MICROSCOPIC
Bilirubin Urine: NEGATIVE
Glucose, UA: NEGATIVE mg/dL
Hgb urine dipstick: NEGATIVE
Ketones, ur: NEGATIVE mg/dL
Nitrite: NEGATIVE
Protein, ur: NEGATIVE mg/dL
Specific Gravity, Urine: 1.01 (ref 1.005–1.030)
pH: 6 (ref 5.0–8.0)

## 2017-05-25 LAB — I-STAT CG4 LACTIC ACID, ED
Lactic Acid, Venous: 2.01 mmol/L (ref 0.5–1.9)
Lactic Acid, Venous: 1.42 mmol/L (ref 0.5–1.9)

## 2017-05-25 MED ORDER — ACETAMINOPHEN 650 MG RE SUPP: 650.0000 mg | Freq: Four times a day (QID) | RECTAL | Status: DC | PRN

## 2017-05-25 MED ORDER — ALBUTEROL SULFATE (2.5 MG/3ML) 0.083% IN NEBU: 3.0000 mL | INHALATION_SOLUTION | Freq: Four times a day (QID) | RESPIRATORY_TRACT | Status: DC | PRN

## 2017-05-25 MED ORDER — MORPHINE SULFATE (PF) 4 MG/ML IV SOLN
4.0000 mg | Freq: Once | INTRAVENOUS | Status: DC
  Filled 2017-05-25: qty 1

## 2017-05-25 MED ORDER — INSULIN ASPART 100 UNIT/ML ~~LOC~~ SOLN
0.0000 [IU] | Freq: Three times a day (TID) | SUBCUTANEOUS | Status: DC
  Administered 2017-05-26 – 2017-05-27 (×2): 1 [IU] via SUBCUTANEOUS

## 2017-05-25 MED ORDER — SODIUM CHLORIDE 0.9 % IV SOLN
1250.0000 mg | Freq: Once | INTRAVENOUS | Status: DC
  Filled 2017-05-25: qty 1250

## 2017-05-25 MED ORDER — FOLIC ACID 1 MG PO TABS
1.0000 mg | ORAL_TABLET | Freq: Every day | ORAL | Status: DC
  Administered 2017-05-26 – 2017-05-27 (×2): 1 mg via ORAL
  Filled 2017-05-25 (×2): qty 1

## 2017-05-25 MED ORDER — TRAMADOL HCL 50 MG PO TABS
50.0000 mg | ORAL_TABLET | Freq: Four times a day (QID) | ORAL | Status: DC | PRN
  Administered 2017-05-26 – 2017-05-27 (×3): 50 mg via ORAL
  Filled 2017-05-25 (×3): qty 1

## 2017-05-25 MED ORDER — SODIUM CHLORIDE 0.9 % IV BOLUS (SEPSIS)
1000.0000 mL | Freq: Once | INTRAVENOUS | Status: AC
  Administered 2017-05-25: 1000 mL via INTRAVENOUS

## 2017-05-25 MED ORDER — SODIUM CHLORIDE 0.9 % IV SOLN
INTRAVENOUS | Status: AC
  Administered 2017-05-25: via INTRAVENOUS

## 2017-05-25 MED ORDER — ASPIRIN EC 81 MG PO TBEC
81.0000 mg | DELAYED_RELEASE_TABLET | Freq: Every day | ORAL | Status: DC
  Administered 2017-05-26 – 2017-05-27 (×2): 81 mg via ORAL
  Filled 2017-05-25 (×2): qty 1

## 2017-05-25 MED ORDER — FERROUS SULFATE 325 (65 FE) MG PO TABS
325.0000 mg | ORAL_TABLET | Freq: Every day | ORAL | Status: DC
  Administered 2017-05-26 – 2017-05-27 (×2): 325 mg via ORAL
  Filled 2017-05-25 (×2): qty 1

## 2017-05-25 MED ORDER — PIPERACILLIN-TAZOBACTAM 3.375 G IVPB 30 MIN
3.3750 g | Freq: Once | INTRAVENOUS | Status: AC
  Administered 2017-05-25: 3.375 g via INTRAVENOUS
  Filled 2017-05-25: qty 50

## 2017-05-25 MED ORDER — ONDANSETRON HCL 4 MG/2ML IJ SOLN
4.0000 mg | Freq: Once | INTRAMUSCULAR | Status: AC
  Administered 2017-05-25: 4 mg via INTRAVENOUS
  Filled 2017-05-25: qty 2

## 2017-05-25 MED ORDER — ACETAMINOPHEN 325 MG PO TABS
650.0000 mg | ORAL_TABLET | Freq: Four times a day (QID) | ORAL | Status: DC | PRN
  Administered 2017-05-27: 650 mg via ORAL
  Filled 2017-05-25: qty 2

## 2017-05-25 MED ORDER — VANCOMYCIN HCL IN DEXTROSE 750-5 MG/150ML-% IV SOLN
750.0000 mg | INTRAVENOUS | Status: DC
  Filled 2017-05-25: qty 150

## 2017-05-25 MED ORDER — ENOXAPARIN SODIUM 30 MG/0.3ML ~~LOC~~ SOLN
30.0000 mg | SUBCUTANEOUS | Status: DC
  Administered 2017-05-25 – 2017-05-26 (×2): 30 mg via SUBCUTANEOUS
  Filled 2017-05-25 (×2): qty 0.3

## 2017-05-25 MED ORDER — PIPERACILLIN-TAZOBACTAM IN DEX 2-0.25 GM/50ML IV SOLN
2.2500 g | Freq: Four times a day (QID) | INTRAVENOUS | Status: DC
  Administered 2017-05-25 – 2017-05-26 (×3): 2.25 g via INTRAVENOUS
  Filled 2017-05-25 (×4): qty 50

## 2017-05-25 MED ORDER — HYDRALAZINE HCL 25 MG PO TABS
25.0000 mg | ORAL_TABLET | Freq: Four times a day (QID) | ORAL | Status: DC
  Administered 2017-05-25 – 2017-05-27 (×8): 25 mg via ORAL
  Filled 2017-05-25 (×8): qty 1

## 2017-05-25 MED ORDER — PREDNISONE 10 MG PO TABS
10.0000 mg | ORAL_TABLET | Freq: Every day | ORAL | Status: DC
  Administered 2017-05-26 – 2017-05-27 (×2): 10 mg via ORAL
  Filled 2017-05-25 (×2): qty 1

## 2017-05-25 MED ORDER — LABETALOL HCL 100 MG PO TABS
100.0000 mg | ORAL_TABLET | Freq: Two times a day (BID) | ORAL | Status: DC
  Administered 2017-05-26: 100 mg via ORAL
  Filled 2017-05-25: qty 1

## 2017-05-25 NOTE — Telephone Encounter (Signed)
Left message for patient to return call.

## 2017-05-25 NOTE — ED Notes (Signed)
Admitting and student at bedside.

## 2017-05-25 NOTE — ED Triage Notes (Signed)
Pt endorses fatigue for several days and also has a wound to the right shin that is leaking clear drainage that has been there x 1 year. WOund has been monitored by pcp during same time and pt was told that it was nodule from her Rheumatoid Arthritis.

## 2017-05-25 NOTE — Telephone Encounter (Signed)
Lattie Corns called again to check the status on the note below. Please advise. whee

## 2017-05-25 NOTE — Telephone Encounter (Signed)
Please follow up on her.

## 2017-05-25 NOTE — Progress Notes (Signed)
Pharmacy Antibiotic Note  [name] [name] is a 83 y.o. female admitted on 05/25/2017 with rheumatoid nodule infection.  Pharmacy has been consulted for vancomycin and zosyn dosing. Pt is afebrile but WBC is elevated at 13.2. Scr is elevated above baseline at 1.87. Lactic acid has improved to 1.42.   Plan: Vancomycin 1250mg  IV x 1 then 750mg  IV Q24H Zosyn 3.375gm IV x 1 then 2.25gm IV Q6H F/u renal fxn, C&S, clinical status and trough at SS  Height: 5\' 4"  (162.6 cm) Weight: 148 lb (67.1 kg) IBW/kg (Calculated) : 54.7  Temp (24hrs), Avg:98.9 F (37.2 C), Min:98.3 F (36.8 C), Max:99.4 F (37.4 C)   Recent Labs Lab 05/25/17 1209 05/25/17 1243 05/25/17 1524  WBC 13.2*  --   --   CREATININE 1.87*  --   --   LATICACIDVEN  --  2.01* 1.42    Estimated Creatinine Clearance: 20.7 mL/min (A) (by C-G formula based on SCr of 1.87 mg/dL (H)).    Allergies  Allergen Reactions  . Clonidine Derivatives Swelling    [name].    . Fish Allergy Swelling  . Shellfish Allergy Hives  . Doxycycline Rash  . Indomethacin Other (See Comments)    unknown  . Lyrica [Pregabalin]     hallucinations  . Methyldopa Other (See Comments)    unknown  . Cetirizine Hcl Itching and Rash    unknown  . Codeine Itching    unknown  . Levaquin [Levofloxacin In D5w] Rash  . Tomato Rash    Antimicrobials this admission: Vanc 7/17>> Zosyn 7/17>>  Dose adjustments this admission: N/A  Microbiology results: Pending  Thank you for allowing pharmacy to be a part of this patient's care.  Jelina Paulsen, 05/27/17 05/25/2017 4:58 PM

## 2017-05-25 NOTE — Telephone Encounter (Signed)
Spoke with Elease Hashimoto in regards to Ms. [name] [name]. Elease Hashimoto states the patient is not doing well. Patient is in a lot of pain and not mobile at all. Patient is not eating or wanting to do anything at all. Dewayne Hatch to take patient to emergency room to be evaluated. She verbalized understanding.

## 2017-05-25 NOTE — ED Provider Notes (Addendum)
[facility]-EMERGENCY DEPT Provider Note   CSN: [id number] Arrival date & time: 05/25/17  1155     History   Chief Complaint Chief Complaint  Patient presents with  . Weakness  . Anorexia  . Wound Infection    HPI [name] [name] is a 83 y.o. female.  83 yo F with a chief complaint of weakness. Going on for the past week or so. Patient has been treated for a wound to her right lower extremity which she attributes to a complication of her immune suppression. She is on that for rheumatoid arthritis. Has been taken off of it for the past couple weeks has been on doxycycline and Keflex then most recently Bactrim without improvement and in fact worsening. Patient has not been eating and drinking she is unable to describe why this is does think she has a little bit of nausea. Has had worsening of her chronic pain since she is off of her immunosuppression. Denies fevers or chills. Has a very mild cough and congestion. Denies dysuria or increased frequency or hesitancy. Denies diarrhea vomiting.   The history is provided by the patient.  Weakness  Primary symptoms include no dizziness. Pertinent negatives include no shortness of breath, no chest pain, no vomiting and no headaches.  Illness  This is a new problem. The current episode started more than 2 days ago. The problem occurs constantly. The problem has not changed since onset.Pertinent negatives include no chest pain, no abdominal pain, no headaches and no shortness of breath. Nothing aggravates the symptoms. Nothing relieves the symptoms. She has tried nothing for the symptoms. The treatment provided no relief.    Past Medical History:  Diagnosis Date  . Allergic rhinitis due to pollen 04/27/2007  . Arthritis    "in q joint" (08/07/2013)  . Coronary atherosclerosis of native coronary artery   . Cough   . Disorder of bone and cartilage, unspecified   . Edema 05/03/2013  . Exertional shortness of breath    "sometimes" (08/07/2013)  .  First degree atrioventricular block   . Gout, unspecified 04/19/2013  . Hyperlipidemia 04/27/2007  . Hypertension   . Muscle spasm   . Muscle weakness (generalized)   . Myocardial infarction (HCC) ~ 1970  . Onychia and paronychia of toe   . Osteoarthrosis, unspecified whether generalized or localized, unspecified site 04/27/2007  . Other fall   . Other malaise and fatigue   . Prepatellar bursitis   . Seizures (HCC)   . Stroke (HCC) 01/17/2014  . TIA (transient ischemic attack)    "a few one summer" (08/07/2013)  . Type II diabetes mellitus (HCC)    "fasting 90-110s" (08/07/2013)  . Unspecified essential hypertension 08/08/2013  . Unspecified vitamin D deficiency     Patient Active Problem List   Diagnosis Date Noted  . History of CHF (congestive heart failure) 02/18/2017  . History of diabetes mellitus 02/18/2017  . History of total knee arthroplasty, bilateral 02/18/2017  . History of rotator cuff surgery 02/18/2017  . Obesity (BMI 30.0-34.9) 12/08/2016  . High risk medications (not anticoagulants) long-term use 09/21/2016  . Hammer toe 10/31/2015  . Acute on chronic diastolic heart failure (HCC) 09/12/2015  . Onychomycosis 04/24/2015  . Midline low back pain without sciatica 06/27/2014  . Bilateral edema of lower extremity 06/05/2014  . CKD (chronic kidney disease) stage 3, GFR 30-59 ml/min 06/05/2014  . DM (diabetes mellitus), type 2 with renal complications (HCC) 06/05/2014    Class: Chronic  . Anemia of chronic  disease 05/01/2014  . Fluctuating blood pressure 02/06/2014  . Stroke (HCC) 01/17/2014  . Rheumatoid arthritis (HCC) 12/19/2013    Class: Chronic  . CAD (coronary artery disease) 10/18/2013  . HTN (hypertension) 08/08/2013    Class: Chronic  . Fibromyalgia 05/10/2013  . Reactive airway disease 04/19/2013  . Gout 04/19/2013  . Hyperlipidemia 04/27/2007  . Allergic rhinitis 04/27/2007  . Diverticulosis 04/27/2007    Past Surgical History:  Procedure  Laterality Date  . ABDOMINAL HYSTERECTOMY  04/1980  . APPENDECTOMY  1960  . CARDIAC CATHETERIZATION  2003  . CATARACT EXTRACTION W/ INTRAOCULAR LENS  IMPLANT, BILATERAL Bilateral ~ 2012  . JOINT REPLACEMENT    . REPLACEMENT TOTAL KNEE Right 09/2005  . SHOULDER OPEN ROTATOR CUFF REPAIR Right 07/1999  . TONSILLECTOMY  08/1974  . TOTAL KNEE ARTHROPLASTY Left 08/07/2013  . TOTAL KNEE ARTHROPLASTY Left 08/07/2013   Procedure: TOTAL KNEE ARTHROPLASTY- LEFT;  Surgeon: Dannielle Huh, MD;  Location: [facility] OR;  Service: Orthopedics;  Laterality: Left;  . TRANSTHORACIC ECHOCARDIOGRAM  2003   EF 55-65%; mild concentric LVH    OB History    No data available       Home Medications    Prior to Admission medications   Medication Sig Start Date End Date Taking? Authorizing Provider  albuterol (PROVENTIL HFA;VENTOLIN HFA) 108 (90 Base) MCG/ACT inhaler Inhale 1 puff into the lungs every 6 (six) hours as needed for wheezing or shortness of breath.    [provider]  allopurinol (ZYLOPRIM) 100 MG tablet take 1 tablet by mouth once daily for GOUT 03/11/17   Helane Rima, DO  aspirin EC 81 MG tablet Take 81 mg by mouth daily.    [provider]  calcium-vitamin D (OSCAL WITH D) 500-200 MG-UNIT per tablet Take 1 tablet by mouth daily.     [provider]  cephALEXin (KEFLEX) 500 MG capsule Take 500 mg by mouth 4 (four) times daily.    [provider]  Cholecalciferol (VITAMIN D-3) 1000 UNITS CAPS Take 1 capsule by mouth daily.    [provider]  doxycycline (VIBRA-TABS) 100 MG tablet Take 1 tablet (100 mg total) by mouth 2 (two) times daily. 05/05/17   Helane Rima, DO  ferrous sulfate 325 (65 FE) MG tablet Take 325 mg by mouth daily.     [provider]  folic acid (FOLVITE) 1 MG tablet Take 1 tablet (1 mg total) by mouth daily. 09/21/16 12/15/17  Panwala, Naitik, PA-C  furosemide (LASIX) 20 MG tablet Take 1 tablet (20 mg total) by mouth daily. 03/11/17    Helane Rima, DO  glucose blood (ONE TOUCH ULTRA TEST) test strip Check blood sugar once daily DX 250.00 05/18/14   Reed, Tiffany L, DO  hydrALAZINE (APRESOLINE) 25 MG tablet Take 1 tablet (25 mg total) by mouth 4 (four) times daily. 03/11/17   Helane Rima, DO  hydroxypropyl methylcellulose / hypromellose (ISOPTO TEARS / GONIOVISC) 2.5 % ophthalmic solution Place 1 drop into both eyes 4 (four) times daily as needed for dry eyes.    [provider]  ketoconazole (NIZORAL) 2 % cream Apply 1 application topically 2 (two) times daily. Patient taking differently: Apply 1 application topically 2 (two) times daily as needed for irritation.  12/03/16   Elvina Sidle, MD  labetalol (NORMODYNE) 100 MG tablet take 2 tablets by mouth twice a day 05/11/17   Helane Rima, DO  lisinopril (PRINIVIL,ZESTRIL) 30 MG tablet Take 1 tablet (30 mg total) by mouth daily. 03/11/17  Helane Rima, DO  metFORMIN (GLUCOPHAGE) 500 MG tablet take 1 tablet by mouth once daily for diabetes 03/11/17   Helane Rima, DO  mupirocin ointment (BACTROBAN) 2 % Place 1 application into the nose 2 (two) times daily. 12/08/16   Helane Rima, DO  OVER THE COUNTER MEDICATION Take 1 tablet by mouth 2 (two) times daily. Wellness formula for immune support    [provider]  polyvinyl alcohol (LIQUIFILM TEARS) 1.4 % ophthalmic solution Place 1 drop into both eyes as needed (for dry eyes).    [provider]  predniSONE (DELTASONE) 5 MG tablet Take 2 tablets (10 mg total) by mouth daily with breakfast. 05/05/17   Pollyann Savoy, MD  sulfamethoxazole-trimethoprim (BACTRIM DS,SEPTRA DS) 800-160 MG tablet Take 1 tablet by mouth 2 (two) times daily. 05/15/17 05/25/17  Mardella Layman, MD  Tocilizumab (ACTEMRA) 162 MG/0.9ML SOSY Inject 162 mg into the skin every 14 (fourteen) days. 04/23/17   Pollyann Savoy, MD  traMADol Janean Sark) 50 MG tablet Take 1-2 tablets every 6 hours as needed for pain 03/11/17   Helane Rima, DO    triamcinolone cream (KENALOG) 0.1 % apply to affected area twice a day if needed for IRRITATION 01/18/17   Kimber Relic, MD  vitamin C (ASCORBIC ACID) 500 MG tablet Take 500 mg by mouth daily.    [provider]    Family History Family History  Problem Relation Age of Onset  . Heart attack Father     Social History Social History  Substance Use Topics  . Smoking status: Never Smoker  . Smokeless tobacco: Never Used  . Alcohol use No     Allergies   Clonidine derivatives; Fish allergy; Shellfish allergy; Doxycycline; Indomethacin; Lyrica [pregabalin]; Methyldopa; Cetirizine hcl; Codeine; Levaquin [levofloxacin in d5w]; and Tomato   Review of Systems Review of Systems  Constitutional: Positive for fatigue. Negative for chills and fever.  HENT: Negative for congestion and rhinorrhea.   Eyes: Negative for redness and visual disturbance.  Respiratory: Negative for shortness of breath and wheezing.   Cardiovascular: Negative for chest pain and palpitations.  Gastrointestinal: Positive for nausea. Negative for abdominal pain, anal bleeding and vomiting.  Genitourinary: Negative for dysuria and urgency.  Musculoskeletal: Negative for arthralgias and myalgias.  Skin: Positive for color change and wound. Negative for pallor.  Neurological: Positive for weakness. Negative for dizziness and headaches.     Physical Exam Updated Vital Signs BP (!) 145/129   Pulse (!) 105   Temp 98.3 F (36.8 C) (Oral)   Resp 18   Ht 5\' 4"  (1.626 m)   Wt 67.1 kg (148 lb)   SpO2 97%   BMI 25.40 kg/m   Physical Exam  Constitutional: She is oriented to person, place, and time. She appears well-developed and well-nourished. No distress.  Warm to touch  HENT:  Head: Normocephalic and atraumatic.  Eyes: Pupils are equal, round, and reactive to light. EOM are normal.  Neck: Normal range of motion. Neck supple.  Cardiovascular: Normal rate and regular rhythm.  Exam reveals no gallop  and no friction rub.   No murmur heard. Pulmonary/Chest: Effort normal. She has no wheezes. She has no rales.  Abdominal: Soft. She exhibits no distension and no mass. There is no tenderness. There is no guarding.  Musculoskeletal: She exhibits no edema or tenderness.  3 quarter sized chronic wounds. One just superior to the medial malleolus with purulent drainage. Mild tenderness.  Chronic joint changes consistent with rheumatoid arthritis.  Neurological: She  is alert and oriented to person, place, and time.  Skin: Skin is warm and dry. She is not diaphoretic.  Psychiatric: She has a normal mood and affect. Her behavior is normal.  Nursing note and vitals reviewed.    ED Treatments / Results  Labs (all labs ordered are listed, but only abnormal results are displayed) Labs Reviewed  COMPREHENSIVE METABOLIC PANEL - Abnormal; Notable for the following:       Result Value   Sodium 132 (*)    Chloride 100 (*)    CO2 21 (*)    Glucose, Bld 114 (*)    BUN 32 (*)    Creatinine, Ser 1.87 (*)    GFR calc non Af Amer 23 (*)    GFR calc Af Amer 27 (*)    All other components within normal limits  CBC WITH DIFFERENTIAL/PLATELET - Abnormal; Notable for the following:    WBC 13.2 (*)    RBC 3.64 (*)    Hemoglobin 11.3 (*)    HCT 33.7 (*)    RDW 18.1 (*)    Platelets 429 (*)    Neutro Abs 10.6 (*)    All other components within normal limits  URINALYSIS, ROUTINE W REFLEX MICROSCOPIC - Abnormal; Notable for the following:    APPearance CLOUDY (*)    Leukocytes, UA SMALL (*)    All other components within normal limits  URINALYSIS, MICROSCOPIC (REFLEX) - Abnormal; Notable for the following:    Bacteria, UA MANY (*)    Squamous Epithelial / LPF 6-30 (*)    All other components within normal limits  I-STAT CG4 LACTIC ACID, ED - Abnormal; Notable for the following:    Lactic Acid, Venous 2.01 (*)    All other components within normal limits  CULTURE, BLOOD (ROUTINE X 2)  CULTURE, BLOOD  (ROUTINE X 2)  I-STAT CG4 LACTIC ACID, ED    EKG  EKG Interpretation  Date/Time:  Tuesday May 25 2017 16:43:37 EDT Ventricular Rate:  83 PR Interval:    QRS Duration: 98 QT Interval:  384 QTC Calculation: 449 R Axis:   -8 Text Interpretation:  Sinus rhythm Inferior infarct, old Lateral leads are also involved Baseline wander in lead(s) III aVL aVF V2 V3 V4 V5 V6 ? morphology change to st segment to v2 TECHNICALLY DIFFICULT background noise Otherwise no significant change Confirmed by Melene Plan [phone]) on 05/25/2017 4:51:15 PM       Radiology Dg Chest 2 View  Result Date: 05/25/2017 CLINICAL DATA:  Fatigue, several days duration EXAM: CHEST  2 VIEW COMPARISON:  01/10/2017 FINDINGS: Chronic cardiomegaly and aortic atherosclerosis. The vascularity is normal. The lungs are clear. No effusions. Chronic degenerative changes affect the spine. Extensive degenerative changes of the shoulders. IMPRESSION: Chronic cardiomegaly and aortic atherosclerosis.  No active disease. Electronically Signed   By: Paulina Fusi [name]   On: 05/25/2017 15:47   Dg Ankle Complete Right  Result Date: 05/25/2017 CLINICAL DATA:  Soft tissue lesion. EXAM: RIGHT ANKLE - COMPLETE 3+ VIEW COMPARISON:  None. FINDINGS: Nonspecific soft tissue swelling. Degenerative changes at the subtalar joint and midfoot. Densities in the soft tissues anterior to the distal tibia which are nonspecific and presumed represent vascular calcifications. Cannot rule out foreign object. No sign of osteomyelitis. IMPRESSION: No acute or traumatic findings. No sign of osteomyelitis. Subtalar and midfoot degenerative changes. Nonspecific soft tissue swelling. Density anterior to the tibia presumed represent a vascular calcification, though foreign object cannot be excluded. This does not appear  to be immediately related to the surface abnormality of the medial lower leg. Electronically Signed   By: Paulina Fusi [name]   On: 05/25/2017 15:46     Procedures Procedures (including critical care time)  Medications Ordered in ED Medications  sodium chloride 0.9 % bolus 1,000 mL (not administered)  ondansetron (ZOFRAN) injection 4 mg (not administered)  morphine 4 MG/ML injection 4 mg (not administered)  vancomycin (VANCOCIN) 1,250 mg in sodium chloride 0.9 % 250 mL IVPB (not administered)  piperacillin-tazobactam (ZOSYN) IVPB 3.375 g (not administered)     Initial Impression / Assessment and Plan / ED Course  I have reviewed the triage vital signs and the nursing notes.  Pertinent labs & imaging results that were available during my care of the patient were reviewed by me and considered in my medical decision making (see chart for details).     83 yo F With a chief complaint of weakness. On my exam she has a chronic appearing wound with purulent drainage. She is mildly tachycardic. Lactate initially was mildly elevated. She also had a mild AKI. With this infection not improving with outpatient therapy in 3 different antibiotics will start on IV antibiotics and admit. Obtain plain film of the leg.     The patients results and plan were reviewed and discussed.   Any x-rays performed were independently reviewed by myself.   Differential diagnosis were considered with the presenting HPI.  Medications  sodium chloride 0.9 % bolus 1,000 mL (not administered)  ondansetron (ZOFRAN) injection 4 mg (not administered)  morphine 4 MG/ML injection 4 mg (not administered)  vancomycin (VANCOCIN) 1,250 mg in sodium chloride 0.9 % 250 mL IVPB (not administered)  piperacillin-tazobactam (ZOSYN) IVPB 3.375 g (not administered)    Vitals:   05/25/17 1201 05/25/17 1438  BP: (!) 148/75 (!) 145/129  Pulse: (!) 105   Resp: 18   Temp: 98.3 F (36.8 C)   TempSrc: Oral   SpO2: 97%   Weight: 67.1 kg (148 lb)   Height: 5\' 4"  (1.626 m)     Final diagnoses:  Cellulitis of right lower extremity  Dehydration    Admission/ observation  were discussed with the admitting physician, patient and/or family and they are comfortable with the plan.    Final Clinical Impressions(s) / ED Diagnoses   Final diagnoses:  Cellulitis of right lower extremity  Dehydration    New Prescriptions New Prescriptions   No medications on file     Melene Plan, DO 05/25/17 1606    Melene Plan, DO 05/25/17 1651

## 2017-05-25 NOTE — Telephone Encounter (Signed)
Patient daughter called stating that patient is not doing well at all.  Patient has completed her antibiotics, Prednisone is not helping, and pain medicine is not helping. Patient stated that she is not herself and she is hurting all over.  Would like to know when is she able to go back on her rheumatology medicine or be re-evaluated.  Please advise.  CB# is [phone].  Thank You.

## 2017-05-25 NOTE — H&P (Signed)
History and Physical    [name] [name] FAO:[id number] DOB: 1930/03/14 DOA: 05/25/2017  I have briefly reviewed the patient's prior medical records in [facility]  PCP: Helane Rima, DO  Patient coming from: Home  Chief Complaint: Weakness, right lower extremity wounds and discharge  HPI: [name] [name] is a 83 y.o. female with medical history significant of rheumatoid arthritis, chronic kidney disease stage III, hypertension, hyperlipidemia, coronary artery disease, presents to the emergency room with chief complaint of unresolving right lower extremity once and weakness over the last 1-2 weeks.  Patient has a history of rheumatoid arthritis and has been having rheumatoid nodules to her bilateral feet as well as on the right shin.  The skin broke down on top of nodules, and she has noticed that she started having a purulent discharge on 1 of the lesions.  She saw her primary care doctor, was placed patient on several different antibiotics including Keflex, Bactrim and doxycycline, however despite that wounds did not heal.  She started feeling weak, and complained of pain to the PCP (however denied any pain to me), poor p.o. intake, and she was revealed to come to the emergency room for evaluation.  She denies any chest pain, denies any shortness of breath.  She denies any lightheadedness or dizziness.  She has no abdominal pain, nausea, vomiting or diarrhea.  ED Course: In the ED patient is afebrile, she has normal blood pressure, heart rate varies between 80s and 105, she is satting well on room air.  Blood work reveals leukocytosis of 13.2, hemoglobin 11.3, initial lactic acid was 2.0.  She has a creatinine which is elevated 1.87.  She was given vancomycin and Zosyn in the ED for her once, and TRH is asked for admission for right lower extremity wound infection/cellulitis.  Review of Systems: As per HPI otherwise 10 point review of systems negative.   Past Medical History:  Diagnosis  Date  . Allergic rhinitis due to pollen 04/27/2007  . Arthritis    "in q joint" (08/07/2013)  . Coronary atherosclerosis of native coronary artery   . Cough   . Disorder of bone and cartilage, unspecified   . Edema 05/03/2013  . Exertional shortness of breath    "sometimes" (08/07/2013)  . First degree atrioventricular block   . Gout, unspecified 04/19/2013  . Hyperlipidemia 04/27/2007  . Hypertension   . Muscle spasm   . Muscle weakness (generalized)   . Myocardial infarction (HCC) ~ 1970  . Onychia and paronychia of toe   . Osteoarthrosis, unspecified whether generalized or localized, unspecified site 04/27/2007  . Other fall   . Other malaise and fatigue   . Prepatellar bursitis   . Seizures (HCC)   . Stroke (HCC) 01/17/2014  . TIA (transient ischemic attack)    "a few one summer" (08/07/2013)  . Type II diabetes mellitus (HCC)    "fasting 90-110s" (08/07/2013)  . Unspecified essential hypertension 08/08/2013  . Unspecified vitamin D deficiency     Past Surgical History:  Procedure Laterality Date  . ABDOMINAL HYSTERECTOMY  04/1980  . APPENDECTOMY  1960  . CARDIAC CATHETERIZATION  2003  . CATARACT EXTRACTION W/ INTRAOCULAR LENS  IMPLANT, BILATERAL Bilateral ~ 2012  . JOINT REPLACEMENT    . REPLACEMENT TOTAL KNEE Right 09/2005  . SHOULDER OPEN ROTATOR CUFF REPAIR Right 07/1999  . TONSILLECTOMY  08/1974  . TOTAL KNEE ARTHROPLASTY Left 08/07/2013  . TOTAL KNEE ARTHROPLASTY Left 08/07/2013   Procedure: TOTAL KNEE ARTHROPLASTY- LEFT;  Surgeon: Dannielle Huh, MD;  Location: [facility] OR;  Service: Orthopedics;  Laterality: Left;  . TRANSTHORACIC ECHOCARDIOGRAM  2003   EF 55-65%; mild concentric LVH     reports that she has never smoked. She has never used smokeless tobacco. She reports that she does not drink alcohol or use drugs.  Allergies  Allergen Reactions  . Clonidine Derivatives Swelling    [name].      . Fish Allergy Swelling  . Shellfish Allergy Hives  . Doxycycline Rash  . Indomethacin Other (See Comments)    unknown  . Lyrica [Pregabalin]     hallucinations  . Methyldopa Other (See Comments)    unknown  . Cetirizine Hcl Itching and Rash    unknown  . Codeine Itching    unknown  . Levaquin [Levofloxacin In D5w] Rash  . Tomato Rash    Family History  Problem Relation Age of Onset  . Heart attack Father     Prior to Admission medications   Medication Sig Start Date End Date Taking? Authorizing Provider  albuterol (PROVENTIL HFA;VENTOLIN HFA) 108 (90 Base) MCG/ACT inhaler Inhale 1 puff into the lungs every 6 (six) hours as needed for wheezing or shortness of breath.    [provider]  allopurinol (ZYLOPRIM) 100 MG tablet take 1 tablet by mouth once daily for GOUT 03/11/17   Helane Rima, DO  aspirin EC 81 MG tablet Take 81 mg by mouth daily.    [provider]  calcium-vitamin D (OSCAL WITH D) 500-200 MG-UNIT per tablet Take 1 tablet by mouth daily.     [provider]  cephALEXin (KEFLEX) 500 MG capsule Take 500 mg by mouth 4 (four) times daily.    [provider]  Cholecalciferol (VITAMIN D-3) 1000 UNITS CAPS Take 1 capsule by mouth daily.    [provider]  doxycycline (VIBRA-TABS) 100 MG tablet Take 1 tablet (100 mg total) by mouth 2 (two) times daily. 05/05/17   Helane Rima, DO  ferrous sulfate 325 (65 FE) MG tablet Take 325 mg by mouth daily.     [provider]  folic acid (FOLVITE) 1 MG tablet Take 1 tablet (1 mg total) by mouth daily. 09/21/16 12/15/17  Panwala, Naitik, PA-C  furosemide (LASIX) 20 MG tablet Take 1 tablet (20 mg total) by mouth daily. 03/11/17   Helane Rima, DO  glucose blood (ONE TOUCH ULTRA TEST) test strip Check blood sugar once daily DX 250.00 05/18/14   Reed, Tiffany L, DO  hydrALAZINE (APRESOLINE) 25 MG tablet Take 1 tablet (25 mg total) by mouth 4 (four) times daily. 03/11/17   Helane Rima,  DO  hydroxypropyl methylcellulose / hypromellose (ISOPTO TEARS / GONIOVISC) 2.5 % ophthalmic solution Place 1 drop into both eyes 4 (four) times daily as needed for dry eyes.    [provider]  ketoconazole (NIZORAL) 2 % cream Apply 1 application topically 2 (two) times daily. Patient taking differently: Apply 1 application topically 2 (two) times daily as needed for irritation.  12/03/16   Elvina Sidle, MD  labetalol (NORMODYNE) 100 MG tablet take 2 tablets by mouth twice a day 05/11/17   Helane Rima, DO  lisinopril (PRINIVIL,ZESTRIL) 30 MG tablet Take 1 tablet (30 mg total) by mouth daily. 03/11/17   Helane Rima, DO  metFORMIN (GLUCOPHAGE) 500 MG tablet take 1 tablet by mouth once daily for diabetes 03/11/17   Helane Rima, DO  mupirocin ointment (BACTROBAN) 2 % Place  1 application into the nose 2 (two) times daily. 12/08/16   Helane Rima, DO  OVER THE COUNTER MEDICATION Take 1 tablet by mouth 2 (two) times daily. Wellness formula for immune support    [provider]  polyvinyl alcohol (LIQUIFILM TEARS) 1.4 % ophthalmic solution Place 1 drop into both eyes as needed (for dry eyes).    [provider]  predniSONE (DELTASONE) 5 MG tablet Take 2 tablets (10 mg total) by mouth daily with breakfast. 05/05/17   Pollyann Savoy, MD  sulfamethoxazole-trimethoprim (BACTRIM DS,SEPTRA DS) 800-160 MG tablet Take 1 tablet by mouth 2 (two) times daily. 05/15/17 05/25/17  Mardella Layman, MD  Tocilizumab (ACTEMRA) 162 MG/0.9ML SOSY Inject 162 mg into the skin every 14 (fourteen) days. 04/23/17   Pollyann Savoy, MD  traMADol Janean Sark) 50 MG tablet Take 1-2 tablets every 6 hours as needed for pain 03/11/17   Helane Rima, DO  triamcinolone cream (KENALOG) 0.1 % apply to affected area twice a day if needed for IRRITATION 01/18/17   Kimber Relic, MD  vitamin C (ASCORBIC ACID) 500 MG tablet Take 500 mg by mouth daily.    [provider]    Physical Exam: Vitals:    05/25/17 1201 05/25/17 1438 05/25/17 1445 05/25/17 1646  BP: (!) 148/75 (!) 145/129 129/82   Pulse: (!) 105  91   Resp: 18     Temp: 98.3 F (36.8 C)   99.4 F (37.4 C)  TempSrc: Oral   Rectal  SpO2: 97%  99%   Weight: 67.1 kg (148 lb)     Height: 5\' 4"  (1.626 m)      Constitutional: NAD, calm, comfortable Eyes: PERRL, lids and conjunctivae normal ENMT: Mucous membranes are moist. Posterior pharynx clear of any exudate or lesions.  No mucosal lesions Neck: normal, supple Respiratory: clear to auscultation bilaterally, no wheezing, no crackles. Normal respiratory effort. No accessory muscle use.  Cardiovascular: Regular rate and rhythm, no murmurs / rubs / gallops. No extremity edema. 2+ pedal pulses.  Abdomen: no tenderness, no masses palpated. Bowel sounds positive.  Musculoskeletal: Ulnar deviation bilateral fingers, rheumatoid nodules bilateral hands Skin: 3 round 2-3 cm ulcerated rheumatoid nodules on the right anterior shin, the most inferior one with scant purulent discharge.  Very mild skin erythematous changes around the lesions Neurologic: CN 2-12 grossly intact. Strength 5/5 in all 4.  Psychiatric: Normal judgment and insight. Alert and oriented x 3.   Labs on Admission: I have personally reviewed following labs and imaging studies  CBC:  Recent Labs Lab 05/25/17 1209  WBC 13.2*  NEUTROABS 10.6*  HGB 11.3*  HCT 33.7*  MCV 92.6  PLT 429*   Basic Metabolic Panel:  Recent Labs Lab 05/25/17 1209  NA 132*  K 4.7  CL 100*  CO2 21*  GLUCOSE 114*  BUN 32*  CREATININE 1.87*  CALCIUM 9.7   GFR: Estimated Creatinine Clearance: 20.7 mL/min (A) (by C-G formula based on SCr of 1.87 mg/dL (H)). Liver Function Tests:  Recent Labs Lab 05/25/17 1209  AST 21  ALT 14  ALKPHOS 53  BILITOT 0.5  PROT 7.1  ALBUMIN 3.6   No results for input(s): LIPASE, AMYLASE in the last 168 hours. No results for input(s): AMMONIA in the last 168 hours. Coagulation Profile: No  results for input(s): INR, PROTIME in the last 168 hours. Cardiac Enzymes: No results for input(s): CKTOTAL, CKMB, CKMBINDEX, TROPONINI in the last 168 hours. BNP (last 3 results) No results for input(s): PROBNP in  the last 8760 hours. HbA1C: No results for input(s): HGBA1C in the last 72 hours. CBG: No results for input(s): GLUCAP in the last 168 hours. Lipid Profile: No results for input(s): CHOL, HDL, LDLCALC, TRIG, CHOLHDL, LDLDIRECT in the last 72 hours. Thyroid Function Tests: No results for input(s): TSH, T4TOTAL, FREET4, T3FREE, THYROIDAB in the last 72 hours. Anemia Panel: No results for input(s): VITAMINB12, FOLATE, FERRITIN, TIBC, IRON, RETICCTPCT in the last 72 hours. Urine analysis:    Component Value Date/Time   COLORURINE YELLOW 05/25/2017 1230   APPEARANCEUR CLOUDY (A) 05/25/2017 1230   LABSPEC 1.010 05/25/2017 1230   PHURINE 6.0 05/25/2017 1230   GLUCOSEU NEGATIVE 05/25/2017 1230   HGBUR NEGATIVE 05/25/2017 1230   BILIRUBINUR NEGATIVE 05/25/2017 1230   BILIRUBINUR Negative 01/13/2017 1234   KETONESUR NEGATIVE 05/25/2017 1230   PROTEINUR NEGATIVE 05/25/2017 1230   UROBILINOGEN 0.2 01/13/2017 1234   UROBILINOGEN 1.0 09/11/2015 0135   NITRITE NEGATIVE 05/25/2017 1230   LEUKOCYTESUR SMALL (A) 05/25/2017 1230     Radiological Exams on Admission: Dg Chest 2 View  Result Date: 05/25/2017 CLINICAL DATA:  Fatigue, several days duration EXAM: CHEST  2 VIEW COMPARISON:  01/10/2017 FINDINGS: Chronic cardiomegaly and aortic atherosclerosis. The vascularity is normal. The lungs are clear. No effusions. Chronic degenerative changes affect the spine. Extensive degenerative changes of the shoulders. IMPRESSION: Chronic cardiomegaly and aortic atherosclerosis.  No active disease. Electronically Signed   By: Paulina Fusi [name]   On: 05/25/2017 15:47   Dg Ankle Complete Right  Result Date: 05/25/2017 CLINICAL DATA:  Soft tissue lesion. EXAM: RIGHT ANKLE - COMPLETE 3+ VIEW  COMPARISON:  None. FINDINGS: Nonspecific soft tissue swelling. Degenerative changes at the subtalar joint and midfoot. Densities in the soft tissues anterior to the distal tibia which are nonspecific and presumed represent vascular calcifications. Cannot rule out foreign object. No sign of osteomyelitis. IMPRESSION: No acute or traumatic findings. No sign of osteomyelitis. Subtalar and midfoot degenerative changes. Nonspecific soft tissue swelling. Density anterior to the tibia presumed represent a vascular calcification, though foreign object cannot be excluded. This does not appear to be immediately related to the surface abnormality of the medial lower leg. Electronically Signed   By: Paulina Fusi [name]   On: 05/25/2017 15:46    EKG: Independently reviewed.  Sinus rhythm  Assessment/Plan Active Problems:   Hyperlipidemia   HTN (hypertension)   Rheumatoid arthritis (HCC)   Anemia of chronic disease   CKD (chronic kidney disease) stage 3, GFR 30-59 ml/min   DM (diabetes mellitus), type 2 with renal complications (HCC)   Right lower extremity rheumatoid nodules with ulceration and purulent discharge -Very mild cellulitis, not impressive -Microbiology from the culture done as an outpatient showed pansensitive E. coli, and Gram stain also showed gram-positive cocci in pairs, gram-positive rods and gram-negative rods. -Given the fact that the wounds have not been improving with p.o. antibiotics, will order IV vancomycin and Zosyn for now.  May be able to de-escalate rather quickly -Wound care consult  Acute kidney injury on chronic kidney disease stage III -[name] baseline creatinine is between 1 and 1.1, 1.2 -Currently creatinine is 1.8, likely due to poor p.o. intake and dehydration -She has been on Bactrim which can also cause renal dysfunction -Hold Bactrim, hold lisinopril, hold home Lasix -Gentle IV hydration for the next 12 hours, repeat renal function in the morning  Anemia of  chronic disease -Hemoglobin on presentation 11.3, which is consistent with her baseline  Rheumatoid arthritis -Outpatient management, currently  her disease modifying agents are on hold -Continue prednisone  Hypertension -Hold lisinopril and furosemide, continue labetalol  Hyperlipidemia -Continue statin   DVT prophylaxis: Lovenox Code Status: DNR Family Communication: No family at bedside Disposition Plan: Admit to MedSurg Consults called: Wound care consult    Admission status: Observation  At the point of initial evaluation, it is my clinical opinion that admission for OBSERVATION is reasonable and necessary because the patient's presenting complaints in the context of their chronic conditions represent sufficient risk of deterioration or significant morbidity to constitute reasonable grounds for close observation in the hospital setting, but that the patient may be medically stable for discharge from the hospital within 24 to 48 hours.    Pamella Pert, MD [facility] Pager [phone]  If 7PM-7AM, please contact night-coverage [email or web address] Password [facility]  05/25/2017, 4:51 PM

## 2017-05-26 ENCOUNTER — Telehealth: Payer: Self-pay | Admitting: Rheumatology

## 2017-05-26 DIAGNOSIS — M069 Rheumatoid arthritis, unspecified: Secondary | ICD-10-CM | POA: Diagnosis present

## 2017-05-26 DIAGNOSIS — N183 Chronic kidney disease, stage 3 (moderate): Secondary | ICD-10-CM | POA: Diagnosis present

## 2017-05-26 DIAGNOSIS — E860 Dehydration: Secondary | ICD-10-CM | POA: Diagnosis present

## 2017-05-26 DIAGNOSIS — I129 Hypertensive chronic kidney disease with stage 1 through stage 4 chronic kidney disease, or unspecified chronic kidney disease: Secondary | ICD-10-CM | POA: Diagnosis not present

## 2017-05-26 DIAGNOSIS — Z79899 Other long term (current) drug therapy: Secondary | ICD-10-CM

## 2017-05-26 DIAGNOSIS — Z7982 Long term (current) use of aspirin: Secondary | ICD-10-CM | POA: Diagnosis not present

## 2017-05-26 DIAGNOSIS — E1152 Type 2 diabetes mellitus with diabetic peripheral angiopathy with gangrene: Secondary | ICD-10-CM | POA: Diagnosis present

## 2017-05-26 DIAGNOSIS — Z96653 Presence of artificial knee joint, bilateral: Secondary | ICD-10-CM | POA: Diagnosis present

## 2017-05-26 DIAGNOSIS — Z91018 Allergy to other foods: Secondary | ICD-10-CM | POA: Diagnosis not present

## 2017-05-26 DIAGNOSIS — A419 Sepsis, unspecified organism: Secondary | ICD-10-CM | POA: Diagnosis present

## 2017-05-26 DIAGNOSIS — D649 Anemia, unspecified: Secondary | ICD-10-CM

## 2017-05-26 DIAGNOSIS — Z7984 Long term (current) use of oral hypoglycemic drugs: Secondary | ICD-10-CM | POA: Diagnosis not present

## 2017-05-26 DIAGNOSIS — L03115 Cellulitis of right lower limb: Secondary | ICD-10-CM | POA: Diagnosis present

## 2017-05-26 DIAGNOSIS — I2510 Atherosclerotic heart disease of native coronary artery without angina pectoris: Secondary | ICD-10-CM | POA: Diagnosis present

## 2017-05-26 DIAGNOSIS — R630 Anorexia: Secondary | ICD-10-CM | POA: Diagnosis present

## 2017-05-26 DIAGNOSIS — Z8673 Personal history of transient ischemic attack (TIA), and cerebral infarction without residual deficits: Secondary | ICD-10-CM | POA: Diagnosis not present

## 2017-05-26 DIAGNOSIS — Z888 Allergy status to other drugs, medicaments and biological substances status: Secondary | ICD-10-CM

## 2017-05-26 DIAGNOSIS — Z91013 Allergy to seafood: Secondary | ICD-10-CM

## 2017-05-26 DIAGNOSIS — M797 Fibromyalgia: Secondary | ICD-10-CM | POA: Diagnosis present

## 2017-05-26 DIAGNOSIS — E872 Acidosis: Secondary | ICD-10-CM | POA: Diagnosis present

## 2017-05-26 DIAGNOSIS — Z881 Allergy status to other antibiotic agents status: Secondary | ICD-10-CM | POA: Diagnosis not present

## 2017-05-26 DIAGNOSIS — Z794 Long term (current) use of insulin: Secondary | ICD-10-CM | POA: Diagnosis not present

## 2017-05-26 DIAGNOSIS — I130 Hypertensive heart and chronic kidney disease with heart failure and stage 1 through stage 4 chronic kidney disease, or unspecified chronic kidney disease: Secondary | ICD-10-CM | POA: Diagnosis present

## 2017-05-26 DIAGNOSIS — G8929 Other chronic pain: Secondary | ICD-10-CM | POA: Diagnosis present

## 2017-05-26 DIAGNOSIS — E1122 Type 2 diabetes mellitus with diabetic chronic kidney disease: Secondary | ICD-10-CM | POA: Diagnosis not present

## 2017-05-26 DIAGNOSIS — Z7952 Long term (current) use of systemic steroids: Secondary | ICD-10-CM | POA: Diagnosis not present

## 2017-05-26 DIAGNOSIS — R21 Rash and other nonspecific skin eruption: Secondary | ICD-10-CM | POA: Diagnosis not present

## 2017-05-26 DIAGNOSIS — N170 Acute kidney failure with tubular necrosis: Secondary | ICD-10-CM | POA: Diagnosis present

## 2017-05-26 DIAGNOSIS — I252 Old myocardial infarction: Secondary | ICD-10-CM | POA: Diagnosis not present

## 2017-05-26 DIAGNOSIS — D638 Anemia in other chronic diseases classified elsewhere: Secondary | ICD-10-CM | POA: Diagnosis present

## 2017-05-26 DIAGNOSIS — Z90710 Acquired absence of both cervix and uterus: Secondary | ICD-10-CM | POA: Diagnosis not present

## 2017-05-26 DIAGNOSIS — E785 Hyperlipidemia, unspecified: Secondary | ICD-10-CM | POA: Diagnosis not present

## 2017-05-26 LAB — BASIC METABOLIC PANEL
Anion gap: 8 (ref 5–15)
BUN: 33 mg/dL — ABNORMAL HIGH (ref 6–20)
CO2: 24 mmol/L (ref 22–32)
Calcium: 9.1 mg/dL (ref 8.9–10.3)
Chloride: 104 mmol/L (ref 101–111)
Creatinine, Ser: 1.75 mg/dL — ABNORMAL HIGH (ref 0.44–1.00)
GFR calc Af Amer: 29 mL/min — ABNORMAL LOW (ref >60)
GFR calc non Af Amer: 25 mL/min — ABNORMAL LOW (ref >60)
Glucose, Bld: 85 mg/dL (ref 65–99)
Potassium: 4.5 mmol/L (ref 3.5–5.1)
Sodium: 136 mmol/L (ref 135–145)

## 2017-05-26 LAB — CBC
HCT: 32.1 % — ABNORMAL LOW (ref 36.0–46.0)
Hemoglobin: 10.6 g/dL — ABNORMAL LOW (ref 12.0–15.0)
MCH: 31 pg (ref 26.0–34.0)
MCHC: 33 g/dL (ref 30.0–36.0)
MCV: 93.9 fL (ref 78.0–100.0)
Platelets: 351 10*3/uL (ref 150–400)
RBC: 3.42 MIL/uL — ABNORMAL LOW (ref 3.87–5.11)
RDW: 18.4 % — ABNORMAL HIGH (ref 11.5–15.5)
WBC: 10.2 10*3/uL (ref 4.0–10.5)

## 2017-05-26 LAB — GLUCOSE, CAPILLARY
Glucose-Capillary: 89 mg/dL (ref 65–99)
Glucose-Capillary: 111 mg/dL — ABNORMAL HIGH (ref 65–99)
Glucose-Capillary: 134 mg/dL — ABNORMAL HIGH (ref 65–99)
Glucose-Capillary: 134 mg/dL — ABNORMAL HIGH (ref 65–99)

## 2017-05-26 MED ORDER — SODIUM CHLORIDE 0.9 % IV SOLN
INTRAVENOUS | Status: DC
  Administered 2017-05-26 – 2017-05-27 (×2): via INTRAVENOUS

## 2017-05-26 MED ORDER — SODIUM CHLORIDE 0.9 % IV SOLN: INTRAVENOUS | Status: DC

## 2017-05-26 MED ORDER — LABETALOL HCL 100 MG PO TABS
200.0000 mg | ORAL_TABLET | Freq: Two times a day (BID) | ORAL | Status: DC
  Administered 2017-05-26 – 2017-05-27 (×2): 200 mg via ORAL
  Filled 2017-05-26 (×2): qty 2

## 2017-05-26 MED ORDER — LINEZOLID 600 MG/300ML IV SOLN
600.0000 mg | Freq: Two times a day (BID) | INTRAVENOUS | Status: DC
  Filled 2017-05-26: qty 300

## 2017-05-26 NOTE — Consult Note (Addendum)
WOC Nurse wound consult note Reason for Consult: Consult requested for right leg wounds.  Pt states she has been to a dermatologist and they performed a biopsy; these wounds are related to rheumatid arthritis nodules. Wound type: 3 areas of full thickness wounds to right anterior calf. Measurement: 1.6X1.8cm  1X1cm  1X1cm Wound bed: All sites are dry raised and scabbed.  Currently no odor, drainage, or fluctuance.  Very painful to touch. Plan: Dermatology told her to wash them daily and leave open to air. This complex problem is beyond the WOC scope of practice.Topical treatment will not be effective to promote healing. Encouraged pt and family member to resume follow-up with dermatology after discharge for further plan of care or prescription topical cream to apply. Please re-consult if further assistance is needed.  Thank-you,  Cammie Mcgee MSN, RN, CWOCN, [place], CNS [phone]

## 2017-05-26 NOTE — Evaluation (Signed)
Physical Therapy Evaluation Patient Details Name: [name] [name] MRN: [id number] DOB: October 10, 1930 Today's Date: 05/26/2017   History of Present Illness  Pt is an 83 y/o female admitted secondary to increasing fatigue, R shin wound secondary to RA, and anemia of chronic disease. PMH includes HTN, RA, CKD, DM, CVA, seizures, MI, coronary atherosclerosis, s/p bilat TKA, and R rotator cuff repair.   Clinical Impression  Pt admitted secondary to problem above with deficits below. PTA, pt was independent with use of RW with home ambulation, however, needed assist outside of home. Pt has [facility] 7 days/week to help with bathing/dressing/household chores. Upon evaluation, pt limited by decreased strength, balance, and pain in RLE. Requiredmin guard assist for mobility this session. Limited ambulation tolerance secondary to fatigue. Reports she only ambulates household distances normally. Recommending HHPT to increase balance and strength for safety and independence with mobility. Recommending continuing with [facility], and pt has DME at home Will continue to follow acutely to maximize functional mobility independence.     Follow Up Recommendations Home health PT;Supervision for mobility/OOB    Equipment Recommendations  None recommended by PT (has all DME )    Recommendations for Other Services       Precautions / Restrictions Precautions Precautions: Fall Restrictions Weight Bearing Restrictions: No      Mobility  Bed Mobility Overal bed mobility: Needs Assistance Bed Mobility: Supine to Sit     Supine to sit: Min guard     General bed mobility comments: Min guard for safety. Use of bed rails and elevated HOB.   Transfers Overall transfer level: Needs assistance Equipment used: Rolling walker (2 wheeled) Transfers: Sit to/from Stand Sit to Stand: Min guard         General transfer comment: Min guard for safety. Demonstrated safe hand placement.   Ambulation/Gait Ambulation/Gait  assistance: Min guard Ambulation Distance (Feet): 50 Feet Assistive device: Rolling walker (2 wheeled) Gait Pattern/deviations: Step-through pattern;Decreased stride length;Trunk flexed Gait velocity: Decreased Gait velocity interpretation: Below normal speed for age/gender General Gait Details: Slow, mildly unsteady gait, however, no LOB noted. Pt reports she walks really slow as to not increase risk for falls. Required verbal cues for upright posture and proximity to device. Reports she only walks household distances independently, and gets assist for outside ambulation. Reports feels off balance when she doesn't have UE support.   Stairs            Wheelchair Mobility    Modified Rankin (Stroke Patients Only)       Balance Overall balance assessment: Needs assistance Sitting-balance support: No upper extremity supported;Feet supported Sitting balance-Leahy Scale: Good     Standing balance support: Bilateral upper extremity supported;During functional activity Standing balance-Leahy Scale: Poor Standing balance comment: Reliant on UE support for balance.                              Pertinent Vitals/Pain Pain Assessment: 0-10 Pain Score: 4  Pain Location: R leg Pain Descriptors / Indicators: Sore;Tender Pain Intervention(s): Limited activity within patient's tolerance;Monitored during session;Repositioned    Home Living Family/patient expects to be discharged to:: Private residence Living Arrangements: Alone Available Help at Discharge: Personal care attendant;Family;Available PRN/intermittently (9a-12p 7 days/week ) Type of Home: Independent living facility Home Access: Level entry     Home Layout: One level Home Equipment: Hand held shower head;Walker - 4 wheels;Walker - 2 wheels;Cane - single point;Tub bench;Wheelchair - manual  Prior Function Level of Independence: Needs assistance   Gait / Transfers Assistance Needed: Used rollator  ADL's  / Homemaking Assistance Needed: Aide assists with bathing/dressing, cooking breakfast, and home making.   Comments: Aide comes from 9a-12p 7 days/wk.     Hand Dominance   Dominant Hand: Right    Extremity/Trunk Assessment   Upper Extremity Assessment Upper Extremity Assessment: Generalized weakness    Lower Extremity Assessment Lower Extremity Assessment: RLE deficits/detail;Generalized weakness (Grossly 3/5 throughout ) RLE Deficits / Details: RA nodules that are very painful.     Cervical / Trunk Assessment Cervical / Trunk Assessment: Kyphotic  Communication   Communication: No difficulties  Cognition Arousal/Alertness: Awake/alert Behavior During Therapy: WFL for tasks assessed/performed Overall Cognitive Status: Within Functional Limits for tasks assessed                                        General Comments General comments (skin integrity, edema, etc.): Pt's daughter present during session. Educated about HHPT recommendations to increase balance and safety and pt and pt's daughter agreeable.     Exercises     Assessment/Plan    PT Assessment Patient needs continued PT services  PT Problem List Decreased strength;Decreased activity tolerance;Decreased balance;Decreased mobility;Decreased knowledge of use of DME;Pain       PT Treatment Interventions DME instruction;Gait training;Functional mobility training;Therapeutic activities;Therapeutic exercise;Balance training;Neuromuscular re-education;Patient/family education    PT Goals (Current goals can be found in the Care Plan section)  Acute Rehab PT Goals Patient Stated Goal: to have better balance  PT Goal Formulation: With patient Time For Goal Achievement: 06/02/17 Potential to Achieve Goals: Good    Frequency Min 3X/week   Barriers to discharge        Co-evaluation               AM-PAC PT "6 Clicks" Daily Activity  Outcome Measure Difficulty turning over in bed (including  adjusting bedclothes, sheets and blankets)?: A Little Difficulty moving from lying on back to sitting on the side of the bed? : A Little Difficulty sitting down on and standing up from a chair with arms (e.g., wheelchair, bedside commode, etc,.)?: Total Help needed moving to and from a bed to chair (including a wheelchair)?: A Little Help needed walking in hospital room?: A Little Help needed climbing 3-5 steps with a railing? : A Lot 6 Click Score: 15    End of Session Equipment Utilized During Treatment: Gait belt Activity Tolerance: Patient tolerated treatment well Patient left: in chair;with call bell/phone within reach;with family/visitor present Nurse Communication: Mobility status PT Visit Diagnosis: Unsteadiness on feet (R26.81);Pain Pain - Right/Left: Right Pain - part of body: Leg    Time: 1100-1127 PT Time Calculation (min) (ACUTE ONLY): 27 min   Charges:   PT Evaluation $PT Eval Low Complexity: 1 Procedure PT Treatments $Gait Training: 8-22 mins   PT G Codes:   PT G-Codes **NOT FOR INPATIENT CLASS** Functional Assessment Tool Used: AM-PAC 6 Clicks Basic Mobility;Clinical judgement Functional Limitation: Mobility: Walking and moving around Mobility: Walking and Moving Around Current Status (F1638): At least 40 percent but less than 60 percent impaired, limited or restricted Mobility: Walking and Moving Around Goal Status [phone]): At least 1 percent but less than 20 percent impaired, limited or restricted    Gladys Damme, PT, DPT  Acute Rehabilitation Services  Pager: [phone]   [name] 05/26/2017, 12:19  PM   

## 2017-05-26 NOTE — Care Management Note (Signed)
Case Management Note  Patient Details  Name: YINA RIVIERE MRN: [id number] Date of Birth: [birth year]  Subjective/Objective:   Pt admitted on 05/25/17 with RLE rheumatoid nodules with ulceration and discharge, AKI.  PTA, pt resided at home alone.  She has [employer] HH aide, 7 days a week from 9am to 12pm.  Pt ambulates with either a rollator or a cane.  PCP is Dr. Helane Rima.                  Action/Plan: Daughter at bedside; states she and her sister check on pt frequently and help with meal prep.  PCS agency assessing patient for extra hours.  Pt may need PT/OT evaluations for recommendations.  Will follow progress.    Expected Discharge Date:                  Expected Discharge Plan:  Home w Home Health Services  In-House Referral:     Discharge planning Services  CM Consult  Post Acute Care Choice:    Choice offered to:     DME Arranged:    DME Agency:     HH Arranged:    HH Agency:     Status of Service:  In process, will continue to follow  If discussed at Long Length of Stay Meetings, dates discussed:    Additional Comments:  Quintella Baton, RN, BSN  Trauma/Neuro ICU Case Manager [phone]

## 2017-05-26 NOTE — Progress Notes (Addendum)
PROGRESS NOTE    [name] [name]  BPZ:[id number] DOB: June 06, 1930 DOA: 05/25/2017 PCP: Helane Rima, DO  Outpatient Specialists:     Brief Narrative:  41 ? DM ty II htn gout Rh arthritis with recent Nodules  prior TIA 9/14  Failed humira stopped due to chf 3/17  failer Abbe [place] since 09/2016--stopped and started on actrema 6/12  Needing multiple "bursts" of steorids since 02/2017 Prior knee surgeries ckd ii-iii at baseline presented to [facility] ED after calling rheum office with painful LE nodules on the R side  Has been Rx for this for over 1 mo  Tells me had skin biopsy by Derm in November 2017, recent biopsy 2 weeks prior showed rheum noduls Recently Rx by both PCP and Urgent care with Doxy/bactrim   Assessment & Plan:   Active Problems:   Hyperlipidemia   HTN (hypertension)   Rheumatoid arthritis (HCC)   Anemia of chronic disease   CKD (chronic kidney disease) stage 3, GFR 30-59 ml/min   DM (diabetes mellitus), type 2 with renal complications (HCC)   Infected Rheum Nodules? Vasculitic process? Lactic acidosis on admission-RESOLVED Outpatient culture grew gram positive cocci/pansensitive Escherichia coli  Unlikely candidate with active infection for Intra-lesional tacrolimus or solu-medrol  I did speak with the patient's rheumatologist Dr. Corliss Skains  Continue prednisone 10 mg--no need at present time for stress dosing  Greatly appreciate both Dr. Luciana Axe and Kinsinger expertise regarding lesion-review of the lesions  seems to indicate a dry gangrene process with a little bit purulence   If the patient does undergo the roofing or biopsy, we will send for fungal, TB and culture  Change vancomycin and Zosyn to Linezolide as per ID  Acute kidney injury Mild metabolic acidosis on admission-secondary to ATN-resolving slowly  Baseline creatinine is 1.1  Secondary to Bactrim lisinopril and Lasix has had AK I  Continue IV saline 75 cc per hour for now  Repeat renal  panel in a.m.  Rheumatoid arthritis  Previously on Actemra?  Continue prednisone for now as above   Hypertension  Not currently at goal given discontinuation of lisinopril  Increase labetalol 100---> 200ii/day 7/18/`18  Hydralazine 25 4 times a day  Diabetes mellitus type 2  On metformin 500 daily at home   sliding-scale cover age      Lovenox  Inpatient MedSurg Disposition unclear at present  Consultants:  Infectious disease  General surgery Telephone consulted her rheumatologist Dr. Corliss Skains   Procedures:   None   Antimicrobials:   Vanc/zosyn-->linezolide    Subjective: Feels fair Pretty independent at home Has some pain Expresses frustration at current situation   Objective: Vitals:   05/25/17 1715 05/25/17 1813 05/25/17 2151 05/26/17 0540  BP: (!) 157/73 (!) 153/61 (!) 168/75 (!) 160/74  Pulse: 81 78 92 96  Resp: 10 16 16 16   Temp:  98.5 F (36.9 C) 99 F (37.2 C) 97.7 F (36.5 C)  TempSrc:  Oral Oral Oral  SpO2: 98% 99% 100% 100%  Weight:  67.5 kg (148 lb 13 oz)    Height:  5\' 4"  (1.626 m)      Intake/Output Summary (Last 24 hours) at 05/26/17 1423 Last data filed at 05/26/17 0849  Gross per 24 hour  Intake             1421 ml  Output                0 ml  Net  1421 ml   Filed Weights   05/25/17 1201 05/25/17 1813  Weight: 67.1 kg (148 lb) 67.5 kg (148 lb 13 oz)    Examination:  EOMI NCAT No pallor no icterus Looks much younger than stated age Chest is clinically clear no added sound Abdomen is soft nontender nondistended rebound no guarding Patient has various nodules over her anterior shin 5 x 3 cm in size that largest one is 3-4 cm above the medial malleolus it appears to have sloughed and necrotic skin overlying it   Data Reviewed: I have personally reviewed following labs and imaging studies  CBC:  Recent Labs Lab 05/25/17 1209 05/26/17 0446  WBC 13.2* 10.2  NEUTROABS 10.6*  --   HGB 11.3* 10.6*  HCT  33.7* 32.1*  MCV 92.6 93.9  PLT 429* 351   Basic Metabolic Panel:  Recent Labs Lab 05/25/17 1209 05/26/17 0446  NA 132* 136  K 4.7 4.5  CL 100* 104  CO2 21* 24  GLUCOSE 114* 85  BUN 32* 33*  CREATININE 1.87* 1.75*  CALCIUM 9.7 9.1   GFR: Estimated Creatinine Clearance: 22.2 mL/min (A) (by C-G formula based on SCr of 1.75 mg/dL (H)). Liver Function Tests:  Recent Labs Lab 05/25/17 1209  AST 21  ALT 14  ALKPHOS 53  BILITOT 0.5  PROT 7.1  ALBUMIN 3.6   No results for input(s): LIPASE, AMYLASE in the last 168 hours. No results for input(s): AMMONIA in the last 168 hours. Coagulation Profile: No results for input(s): INR, PROTIME in the last 168 hours. Cardiac Enzymes: No results for input(s): CKTOTAL, CKMB, CKMBINDEX, TROPONINI in the last 168 hours. BNP (last 3 results) No results for input(s): PROBNP in the last 8760 hours. HbA1C: No results for input(s): HGBA1C in the last 72 hours. CBG:  Recent Labs Lab 05/25/17 1830 05/25/17 2156 05/26/17 0753 05/26/17 1146  GLUCAP 121* 158* 89 111*   Lipid Profile: No results for input(s): CHOL, HDL, LDLCALC, TRIG, CHOLHDL, LDLDIRECT in the last 72 hours. Thyroid Function Tests: No results for input(s): TSH, T4TOTAL, FREET4, T3FREE, THYROIDAB in the last 72 hours. Anemia Panel: No results for input(s): VITAMINB12, FOLATE, FERRITIN, TIBC, IRON, RETICCTPCT in the last 72 hours. Urine analysis:    Component Value Date/Time   COLORURINE YELLOW 05/25/2017 1230   APPEARANCEUR CLOUDY (A) 05/25/2017 1230   LABSPEC 1.010 05/25/2017 1230   PHURINE 6.0 05/25/2017 1230   GLUCOSEU NEGATIVE 05/25/2017 1230   HGBUR NEGATIVE 05/25/2017 1230   BILIRUBINUR NEGATIVE 05/25/2017 1230   BILIRUBINUR Negative 01/13/2017 1234   KETONESUR NEGATIVE 05/25/2017 1230   PROTEINUR NEGATIVE 05/25/2017 1230   UROBILINOGEN 0.2 01/13/2017 1234   UROBILINOGEN 1.0 09/11/2015 0135   NITRITE NEGATIVE 05/25/2017 1230   LEUKOCYTESUR SMALL (A)  05/25/2017 1230   Sepsis Labs: @LABRCNTIP (procalcitonin:4,lacticidven:4)  )No results found for this or any previous visit (from the past 240 hour(s)).       Radiology Studies: Dg Chest 2 View  Result Date: 05/25/2017 CLINICAL DATA:  Fatigue, several days duration EXAM: CHEST  2 VIEW COMPARISON:  01/10/2017 FINDINGS: Chronic cardiomegaly and aortic atherosclerosis. The vascularity is normal. The lungs are clear. No effusions. Chronic degenerative changes affect the spine. Extensive degenerative changes of the shoulders. IMPRESSION: Chronic cardiomegaly and aortic atherosclerosis.  No active disease. Electronically Signed   By: 03/12/2017 [name]   On: 05/25/2017 15:47   Dg Ankle Complete Right  Result Date: 05/25/2017 CLINICAL DATA:  Soft tissue lesion. EXAM: RIGHT ANKLE - COMPLETE 3+ VIEW  COMPARISON:  None. FINDINGS: Nonspecific soft tissue swelling. Degenerative changes at the subtalar joint and midfoot. Densities in the soft tissues anterior to the distal tibia which are nonspecific and presumed represent vascular calcifications. Cannot rule out foreign object. No sign of osteomyelitis. IMPRESSION: No acute or traumatic findings. No sign of osteomyelitis. Subtalar and midfoot degenerative changes. Nonspecific soft tissue swelling. Density anterior to the tibia presumed represent a vascular calcification, though foreign object cannot be excluded. This does not appear to be immediately related to the surface abnormality of the medial lower leg. Electronically Signed   By: Paulina Fusi [name]   On: 05/25/2017 15:46        Scheduled Meds: . aspirin EC  81 mg Oral Daily  . enoxaparin (LOVENOX) injection  30 mg Subcutaneous Q24H  . ferrous sulfate  325 mg Oral Daily  . folic acid  1 mg Oral Daily  . hydrALAZINE  25 mg Oral QID  . insulin aspart  0-9 Units Subcutaneous TID WC  . labetalol  100 mg Oral BID  . predniSONE  10 mg Oral Q breakfast   Continuous Infusions: . sodium chloride 75  mL/hr at 05/26/17 0827     LOS: 0 days    Time spent: 24    Pleas Koch, MD [facility]   If 7PM-7AM, please contact night-coverage [email or web address] Password [facility] 05/26/2017, 2:23 PM

## 2017-05-26 NOTE — Telephone Encounter (Signed)
Patient has been admitted to the hospital

## 2017-05-26 NOTE — Telephone Encounter (Signed)
I received a phone call from [facility] hospitalist. He states that patient was hospitalized due to infection in her lower extremity. He wanted to know whom to consult. I expressed my concern about her having long-term recurrent infection on her lower extremity and difficulty treating her rheumatoid arthritis for that reason. I would appreciate if he can get cultures to look for the source of infection and also get infectious disease involved so that the infection could be treated. It is difficult to treat her rheumatoid arthritis without treatment of her infection.  He was in agreement. He stated that he will forward me the notes regarding her progress.  Pollyann Savoy, MD

## 2017-05-26 NOTE — Consult Note (Signed)
[facility]       Reason for Consult: skin nodules    Referring Physician: Dr. Mahala Menghini  Active Problems:   Hyperlipidemia   HTN (hypertension)   Rheumatoid arthritis (HCC)   Anemia of chronic disease   CKD (chronic kidney disease) stage 3, GFR 30-59 ml/min   DM (diabetes mellitus), type 2 with renal complications (HCC)   Sepsis (HCC)   . aspirin EC  81 mg Oral Daily  . enoxaparin (LOVENOX) injection  30 mg Subcutaneous Q24H  . ferrous sulfate  325 mg Oral Daily  . folic acid  1 mg Oral Daily  . hydrALAZINE  25 mg Oral QID  . insulin aspart  0-9 Units Subcutaneous TID WC  . labetalol  200 mg Oral BID  . predniSONE  10 mg Oral Q breakfast    Recommendations: Biopsy lower nodule for pathology, AFB, Fungal stain, AFB and fungal cultures.    Hold antibiotics pending biopsy  Assessment: She has chronic nodules on the right leg of unknown etiology.  A biopsy was done as an outpatient but results not available.  Differential of the ulcers include RA with superinfection, other cutaneous infection including Mycobacterial infectious such as fortuitum, marinum, MAI, fungal disease such as sporotrichosis; nocardia.  I doubt this is an acute bacterial infection though some superinfection possible with a small amount of purulence expressed.    Antibiotics: PIP-Tazo day 2  HPI: [name] [name] is a 83 y.o. female with RA, chronic kidney disease, htn who has had worsening weakness and skin nodules of her right lower extremity and sent to the ED.  She has had a work up as an outpatient for these nodules including a biopsy but they have not improved.  + tenderness with palpation, very minimal drainage that is purulent, no fever.  Has been on multiple antibiotics including Keflex, Bactrim, Doxycycline.  No asociated n/v/d.     Review of Systems:  Constitutional: negative for fevers, chills and anorexia Respiratory: negative for cough Gastrointestinal: negative  for diarrhea Integument/breast: negative for pruritus All other systems reviewed and are negative    Past Medical History:  Diagnosis Date  . Allergic rhinitis due to pollen 04/27/2007  . Arthritis    "in q joint" (08/07/2013)  . Coronary atherosclerosis of native coronary artery   . Cough   . Disorder of bone and cartilage, unspecified   . Edema 05/03/2013  . Exertional shortness of breath    "sometimes" (08/07/2013)  . First degree atrioventricular block   . Gout, unspecified 04/19/2013  . Hyperlipidemia 04/27/2007  . Hypertension   . Muscle spasm   . Muscle weakness (generalized)   . Myocardial infarction (HCC) ~ 1970  . Onychia and paronychia of toe   . Osteoarthrosis, unspecified whether generalized or localized, unspecified site 04/27/2007  . Other fall   . Other malaise and fatigue   . Prepatellar bursitis   . Seizures (HCC)   . Stroke (HCC) 01/17/2014  . TIA (transient ischemic attack)    "a few one summer" (08/07/2013)  . Type II diabetes mellitus (HCC)    "fasting 90-110s" (08/07/2013)  . Unspecified essential hypertension 08/08/2013  . Unspecified vitamin D deficiency     Social History  Substance Use Topics  . Smoking status: Never Smoker  . Smokeless tobacco: Never Used  . Alcohol use No    Family History  Problem Relation Age of Onset  . Heart attack Father     Allergies  Allergen  Reactions  . Clonidine Derivatives Swelling    [name]  . Fish Allergy Diarrhea, Swelling and Other (See Comments)    Turns skin "black," but can tolerate white fish Salmon- Diarrhea  . Shellfish Allergy Hives  . Doxycycline Rash  . Indomethacin     Reaction not recalled by the patient  . Lyrica [Pregabalin]     Hallucinations   . Methyldopa     Aldomet (for hypertension): Reaction not recalled by the patient  . Orange Fruit [Citrus] Other (See Comments)    Indigestion/heartburn  . Cetirizine  Hcl Itching and Rash  . Codeine Itching  . Levaquin [Levofloxacin In D5w] Rash  . Tomato Rash    Physical Exam: Constitutional: in no apparent distress and alert  Vitals:   05/26/17 0540 05/26/17 1453  BP: (!) 160/74 134/79  Pulse: 96 95  Resp: 16 16  Temp: 97.7 F (36.5 C) 98 F (36.7 C)   EYES: anicteric ENMT: no thrush Cardiovascular: Cor RRR Respiratory: CTA B; normal respiratory effort GI: Bowel sounds are normal Musculoskeletal: no pedal edema noted Skin: ulcerated area on lower right leg, two other nodular areas  Lab Results  Component Value Date   WBC 10.2 05/26/2017   HGB 10.6 (L) 05/26/2017   HCT 32.1 (L) 05/26/2017   MCV 93.9 05/26/2017   PLT 351 05/26/2017    Lab Results  Component Value Date   CREATININE 1.75 (H) 05/26/2017   BUN 33 (H) 05/26/2017   NA 136 05/26/2017   K 4.5 05/26/2017   CL 104 05/26/2017   CO2 24 05/26/2017    Lab Results  Component Value Date   ALT 14 05/25/2017   AST 21 05/25/2017   ALKPHOS 53 05/25/2017     Microbiology: Recent Results (from the past 240 hour(s))  Blood culture (routine x 2)     Status: None (Preliminary result)   Collection Time: 05/25/17  4:29 PM  Result Value Ref Range Status   Specimen Description BLOOD LEFT ANTECUBITAL  Final   Special Requests   Final    BOTTLES DRAWN AEROBIC AND ANAEROBIC Blood Culture adequate volume   Culture NO GROWTH < 24 HOURS  Final   Report Status PENDING  Incomplete  Blood culture (routine x 2)     Status: None (Preliminary result)   Collection Time: 05/25/17  4:31 PM  Result Value Ref Range Status   Specimen Description BLOOD BLOOD RIGHT FOREARM  Final   Special Requests   Final    BOTTLES DRAWN AEROBIC AND ANAEROBIC Blood Culture adequate volume   Culture NO GROWTH < 24 HOURS  Final   Report Status PENDING  Incomplete    Staci Righter, MD [facility] [email or web address] [phone] pager  [phone]  cell 05/26/2017, 3:32 PM

## 2017-05-26 NOTE — Consult Note (Signed)
[facility] Surgery Consult/Admission Note  [name] [name] [birth year]  [id number].    Requesting MD: Dr. Verlon Au Chief Complaint/Reason for Consult: wound  HPI:   [name] [name] is a 83 y.o. female with medical history significant of rheumatoid arthritis, chronic kidney disease stage III, hypertension, hyperlipidemia, coronary artery disease, diabetes controlled with metformin, who presented to the [facility] ED under the advice of her doctor for her right lower leg wounds. Patient states she has been to her rheumatologist and primary care doctor who have recently taken biopsies of these nodules and stated that they were just rheumatoid arthritis. She has been on 2 courses of antibiotics without improvement. Patient states her doctor took her off of methotrexate and placed her on prednisone. Patient states she's been on prednisone multiple times recently has been on 10 mg daily for the last month. She states only mild pain of these lesions. She states she has had lesions like this in the past but they usually heal up without issues. Patient denies fever, chills, numbness/timing, weakness, abdominal pain. Patient has been afebrile.   ROS:  Review of Systems  Constitutional: Negative for chills and fever.  Respiratory: Negative for shortness of breath.   Cardiovascular: Negative for chest pain.  Gastrointestinal: Negative for abdominal pain, nausea and vomiting.  Skin:       + for lesions on RLE  Neurological: Negative for dizziness and loss of consciousness.     Family History  Problem Relation Age of Onset  . Heart attack Father     Past Medical History:  Diagnosis Date  . Allergic rhinitis due to pollen 04/27/2007  . Arthritis    "in q joint" (08/07/2013)  . Coronary atherosclerosis of native coronary artery   . Cough   . Disorder of bone and cartilage, unspecified   . Edema 05/03/2013  . Exertional shortness of breath    "sometimes" (08/07/2013)  . First degree  atrioventricular block   . Gout, unspecified 04/19/2013  . Hyperlipidemia 04/27/2007  . Hypertension   . Muscle spasm   . Muscle weakness (generalized)   . Myocardial infarction ([place]) ~ 1970  . Onychia and paronychia of toe   . Osteoarthrosis, unspecified whether generalized or localized, unspecified site 04/27/2007  . Other fall   . Other malaise and fatigue   . Prepatellar bursitis   . Seizures ([place])   . Stroke ([place]) 01/17/2014  . TIA (transient ischemic attack)    "a few one summer" (08/07/2013)  . Type II diabetes mellitus ([place])    "fasting 90-110s" (08/07/2013)  . Unspecified essential hypertension 08/08/2013  . Unspecified vitamin D deficiency     Past Surgical History:  Procedure Laterality Date  . ABDOMINAL HYSTERECTOMY  04/1980  . APPENDECTOMY  1960  . CARDIAC CATHETERIZATION  2003  . CATARACT EXTRACTION W/ INTRAOCULAR LENS  IMPLANT, BILATERAL Bilateral ~ 2012  . JOINT REPLACEMENT    . REPLACEMENT TOTAL KNEE Right 09/2005  . SHOULDER OPEN ROTATOR CUFF REPAIR Right 07/1999  . TONSILLECTOMY  08/1974  . TOTAL KNEE ARTHROPLASTY Left 08/07/2013   Procedure: TOTAL KNEE ARTHROPLASTY- LEFT;  Surgeon: Vickey Huger, MD;  Location: [place];  Service: Orthopedics;  Laterality: Left;  . TRANSTHORACIC ECHOCARDIOGRAM  2003   EF 55-65%; mild concentric LVH    Social History:  reports that she has never smoked. She has never used smokeless tobacco. She reports that she does not drink alcohol or use drugs.  Allergies:  Allergies  Allergen Reactions  . Clonidine Derivatives Swelling  [name]  . Fish Allergy Diarrhea, Swelling and Other (See Comments)    Turns skin "black," but can tolerate white fish Salmon- Diarrhea  . Shellfish Allergy Hives  . Doxycycline Rash  . Indomethacin     Reaction not recalled by the patient  . Lyrica [Pregabalin]     Hallucinations   . Methyldopa     Aldomet (for  hypertension): Reaction not recalled by the patient  . Orange Fruit [Citrus] Other (See Comments)    Indigestion/heartburn  . Cetirizine Hcl Itching and Rash  . Codeine Itching  . Levaquin [Levofloxacin In D5w] Rash  . Tomato Rash    Medications Prior to Admission  Medication Sig Dispense Refill  . albuterol (PROVENTIL HFA;VENTOLIN HFA) 108 (90 Base) MCG/ACT inhaler Inhale 1 puff into the lungs every 6 (six) hours as needed for wheezing or shortness of breath.    . allopurinol (ZYLOPRIM) 100 MG tablet take 1 tablet by mouth once daily for GOUT (Patient taking differently: Take 100 mg by mouth every evening. ) 30 tablet 5  . aspirin EC 81 MG tablet Take 81 mg by mouth daily.    . calcium-vitamin D (OSCAL WITH D) 500-200 MG-UNIT per tablet Take 1 tablet by mouth daily.     . CHLOROPHYLL PO Take 5 drops by mouth See admin instructions. MIX INTO 6 OUNCES OF JUICE AND DRINK ONCE A DAY    . ferrous sulfate 325 (65 FE) MG tablet Take 325 mg by mouth daily.     . folic acid (FOLVITE) 1 MG tablet Take 1 tablet (1 mg total) by mouth daily. 90 tablet 4  . furosemide (LASIX) 20 MG tablet Take 1 tablet (20 mg total) by mouth daily. 30 tablet 5  . hydrALAZINE (APRESOLINE) 25 MG tablet Take 1 tablet (25 mg total) by mouth 4 (four) times daily. 120 tablet 5  . hydroxypropyl methylcellulose / hypromellose (ISOPTO TEARS / GONIOVISC) 2.5 % ophthalmic solution Place 1 drop into both eyes 4 (four) times daily as needed for dry eyes.    Marland Kitchen ketoconazole (NIZORAL) 2 % cream Apply 1 application topically 2 (two) times daily. (Patient taking differently: Apply 1 application topically 2 (two) times daily as needed for irritation. ) 30 g 0  . labetalol (NORMODYNE) 100 MG tablet take 2 tablets by mouth twice a day (Patient taking differently: Take 200 mg by mouth 2 (two) times daily. take 2 tablets by mouth twice a day) 120 tablet 0  . lisinopril (PRINIVIL,ZESTRIL) 30 MG tablet Take 1 tablet (30 mg total) by mouth daily.  30 tablet 5  . metFORMIN (GLUCOPHAGE) 500 MG tablet take 1 tablet by mouth once daily for diabetes (Patient taking differently: Take 500 mg by mouth daily with breakfast. ) 90 tablet 1  . Multiple Vitamins-Minerals (ALIVE ONCE DAILY WOMENS PO) Take 15 mLs by mouth every other day.     Marland Kitchen OVER THE COUNTER MEDICATION See admin instructions. Wellness formula for immune support tablets: Take 1 tablet by mouth two times a day    . predniSONE (DELTASONE) 5 MG tablet Take 2 tablets (10 mg total) by mouth daily with breakfast. 60 tablet 0  . traMADol (ULTRAM) 50 MG tablet Take 1-2 tablets every 6 hours as needed for pain (Patient taking differently: Take 100 mg by mouth 2 (two) times daily. MORNING AND BEDTIME) 240 tablet 5  . triamcinolone cream (KENALOG) 0.1 % apply to affected area twice a day if needed  for IRRITATION (Patient taking differently: Apply to affected area twice a day if needed for IRRITATION) 45 g 2  . vitamin C (ASCORBIC ACID) 500 MG tablet Take 500 mg by mouth every other day.     Marland Kitchen glucose blood (ONE TOUCH ULTRA TEST) test strip Check blood sugar once daily DX 250.00 100 each 3  . Tocilizumab (ACTEMRA) 162 MG/0.9ML SOSY Inject 162 mg into the skin every 14 (fourteen) days. 2 Syringe 2    Blood pressure 134/79, pulse 95, temperature 98 F (36.7 C), temperature source Oral, resp. rate 16, height _0  (1.626 m), weight 148 lb 13 oz (67.5 kg), SpO2 99 %.  Physical Exam  Constitutional: She is oriented to person, place, and time and well-developed, well-nourished, and in no distress. Vital signs are normal. No distress.  HENT:  Head: Normocephalic and atraumatic.  Nose: Nose normal.  Eyes: Conjunctivae are normal. Right eye exhibits no discharge. Left eye exhibits no discharge. No scleral icterus.  Pupils are equal  Neck: Normal range of motion. Neck supple. No tracheal deviation present.  Cardiovascular: Normal rate, regular rhythm, normal heart sounds and intact distal pulses.  Exam  reveals no gallop and no friction rub.   No murmur heard. Pulses:      Radial pulses are 2+ on the right side, and 2+ on the left side.  Pulmonary/Chest: Effort normal and breath sounds normal. No respiratory distress. She has no decreased breath sounds. She has no wheezes. She has no rhonchi. She has no rales.  Abdominal: Soft. Bowel sounds are normal. She exhibits no distension and no mass. There is no tenderness. There is no rebound and no guarding. No hernia.  Musculoskeletal: Normal range of motion. She exhibits tenderness. She exhibits no edema.  3 lesions noted to right lower extremity, lesion on the anterior shin just distal to knee with TTP, lesion just superior to medial malleoli with purulent drainage (see photos below)  Neurological: She is alert and oriented to person, place, and time.  Skin: Skin is warm and dry. No rash noted. She is not diaphoretic.  Psychiatric: Mood and affect normal.  Nursing note and vitals reviewed.   Anterior right lower extremity   Lesion just superior to medial malleoli with purulent drainage noted    Results for orders placed or performed during the hospital encounter of 05/25/17 (from the past 48 hour(s))  Comprehensive metabolic panel     Status: Abnormal   Collection Time: 05/25/17 12:09 PM  Result Value Ref Range   Sodium 132 (L) 135 - 145 mmol/L   Potassium 4.7 3.5 - 5.1 mmol/L   Chloride 100 (L) 101 - 111 mmol/L   CO2 21 (L) 22 - 32 mmol/L   Glucose, Bld 114 (H) 65 - 99 mg/dL   BUN 32 (H) 6 - 20 mg/dL   Creatinine, Ser 1.87 (H) 0.44 - 1.00 mg/dL   Calcium 9.7 8.9 - 10.3 mg/dL   Total Protein 7.1 6.5 - 8.1 g/dL   Albumin 3.6 3.5 - 5.0 g/dL   AST 21 15 - 41 U/L   ALT 14 14 - 54 U/L   Alkaline Phosphatase 53 38 - 126 U/L   Total Bilirubin 0.5 0.3 - 1.2 mg/dL   GFR calc non Af Amer 23 (L) >60 mL/min   GFR calc Af Amer 27 (L) >60 mL/min    Comment: (NOTE) The eGFR has been calculated using the CKD EPI equation. This calculation has  not been validated in all clinical situations.  eGFR's persistently <60 mL/min signify possible Chronic Kidney Disease.    Anion gap 11 5 - 15  CBC with Differential     Status: Abnormal   Collection Time: 05/25/17 12:09 PM  Result Value Ref Range   WBC 13.2 (H) 4.0 - 10.5 K/uL   RBC 3.64 (L) 3.87 - 5.11 MIL/uL   Hemoglobin 11.3 (L) 12.0 - 15.0 g/dL   HCT 33.7 (L) 36.0 - 46.0 %   MCV 92.6 78.0 - 100.0 fL   MCH 31.0 26.0 - 34.0 pg   MCHC 33.5 30.0 - 36.0 g/dL   RDW 18.1 (H) 11.5 - 15.5 %   Platelets 429 (H) 150 - 400 K/uL   Neutrophils Relative % 81 %   Neutro Abs 10.6 (H) 1.7 - 7.7 K/uL   Lymphocytes Relative 12 %   Lymphs Abs 1.6 0.7 - 4.0 K/uL   Monocytes Relative 6 %   Monocytes Absolute 0.8 0.1 - 1.0 K/uL   Eosinophils Relative 1 %   Eosinophils Absolute 0.1 0.0 - 0.7 K/uL   Basophils Relative 0 %   Basophils Absolute 0.0 0.0 - 0.1 K/uL  Urinalysis, Routine w reflex microscopic     Status: Abnormal   Collection Time: 05/25/17 12:30 PM  Result Value Ref Range   Color, Urine YELLOW YELLOW   APPearance CLOUDY (A) CLEAR   Specific Gravity, Urine 1.010 1.005 - 1.030   pH 6.0 5.0 - 8.0   Glucose, UA NEGATIVE NEGATIVE mg/dL   Hgb urine dipstick NEGATIVE NEGATIVE   Bilirubin Urine NEGATIVE NEGATIVE   Ketones, ur NEGATIVE NEGATIVE mg/dL   Protein, ur NEGATIVE NEGATIVE mg/dL   Nitrite NEGATIVE NEGATIVE   Leukocytes, UA SMALL (A) NEGATIVE  Urinalysis, Microscopic (reflex)     Status: Abnormal   Collection Time: 05/25/17 12:30 PM  Result Value Ref Range   RBC / HPF NONE SEEN 0 - 5 RBC/hpf   WBC, UA 0-5 0 - 5 WBC/hpf   Bacteria, UA MANY (A) NONE SEEN   Squamous Epithelial / LPF 6-30 (A) NONE SEEN   Urine-Other MICROSCOPIC EXAM PERFORMED ON UNCONCENTRATED URINE     Comment: LESS THAN 10 mL OF URINE SUBMITTED  I-Stat CG4 Lactic Acid, ED     Status: Abnormal   Collection Time: 05/25/17 12:43 PM  Result Value Ref Range   Lactic Acid, Venous 2.01 (HH) 0.5 - 1.9 mmol/L   Comment  NOTIFIED PHYSICIAN   I-Stat CG4 Lactic Acid, ED     Status: None   Collection Time: 05/25/17  3:24 PM  Result Value Ref Range   Lactic Acid, Venous 1.42 0.5 - 1.9 mmol/L  Blood culture (routine x 2)     Status: None (Preliminary result)   Collection Time: 05/25/17  4:29 PM  Result Value Ref Range   Specimen Description BLOOD LEFT ANTECUBITAL    Special Requests      BOTTLES DRAWN AEROBIC AND ANAEROBIC Blood Culture adequate volume   Culture NO GROWTH < 24 HOURS    Report Status PENDING   Blood culture (routine x 2)     Status: None (Preliminary result)   Collection Time: 05/25/17  4:31 PM  Result Value Ref Range   Specimen Description BLOOD BLOOD RIGHT FOREARM    Special Requests      BOTTLES DRAWN AEROBIC AND ANAEROBIC Blood Culture adequate volume   Culture NO GROWTH < 24 HOURS    Report Status PENDING   Glucose, capillary     Status: Abnormal   Collection  Time: 05/25/17  6:30 PM  Result Value Ref Range   Glucose-Capillary 121 (H) 65 - 99 mg/dL   Comment 1 Document in Chart   Glucose, capillary     Status: Abnormal   Collection Time: 05/25/17  9:56 PM  Result Value Ref Range   Glucose-Capillary 158 (H) 65 - 99 mg/dL  Basic metabolic panel     Status: Abnormal   Collection Time: 05/26/17  4:46 AM  Result Value Ref Range   Sodium 136 135 - 145 mmol/L   Potassium 4.5 3.5 - 5.1 mmol/L   Chloride 104 101 - 111 mmol/L   CO2 24 22 - 32 mmol/L   Glucose, Bld 85 65 - 99 mg/dL   BUN 33 (H) 6 - 20 mg/dL   Creatinine, Ser 1.75 (H) 0.44 - 1.00 mg/dL   Calcium 9.1 8.9 - 10.3 mg/dL   GFR calc non Af Amer 25 (L) >60 mL/min   GFR calc Af Amer 29 (L) >60 mL/min    Comment: (NOTE) The eGFR has been calculated using the CKD EPI equation. This calculation has not been validated in all clinical situations. eGFR's persistently <60 mL/min signify possible Chronic Kidney Disease.    Anion gap 8 5 - 15  CBC     Status: Abnormal   Collection Time: 05/26/17  4:46 AM  Result Value Ref Range     WBC 10.2 4.0 - 10.5 K/uL   RBC 3.42 (L) 3.87 - 5.11 MIL/uL   Hemoglobin 10.6 (L) 12.0 - 15.0 g/dL   HCT 32.1 (L) 36.0 - 46.0 %   MCV 93.9 78.0 - 100.0 fL   MCH 31.0 26.0 - 34.0 pg   MCHC 33.0 30.0 - 36.0 g/dL   RDW 18.4 (H) 11.5 - 15.5 %   Platelets 351 150 - 400 K/uL  Glucose, capillary     Status: None   Collection Time: 05/26/17  7:53 AM  Result Value Ref Range   Glucose-Capillary 89 65 - 99 mg/dL  Glucose, capillary     Status: Abnormal   Collection Time: 05/26/17 11:46 AM  Result Value Ref Range   Glucose-Capillary 111 (H) 65 - 99 mg/dL   Dg Chest 2 View  Result Date: 05/25/2017 CLINICAL DATA:  Fatigue, several days duration EXAM: CHEST  2 VIEW COMPARISON:  01/10/2017 FINDINGS: Chronic cardiomegaly and aortic atherosclerosis. The vascularity is normal. The lungs are clear. No effusions. Chronic degenerative changes affect the spine. Extensive degenerative changes of the shoulders. IMPRESSION: Chronic cardiomegaly and aortic atherosclerosis.  No active disease. Electronically Signed   By: Nelson Chimes [name]   On: 05/25/2017 15:47   Dg Ankle Complete Right  Result Date: 05/25/2017 CLINICAL DATA:  Soft tissue lesion. EXAM: RIGHT ANKLE - COMPLETE 3+ VIEW COMPARISON:  None. FINDINGS: Nonspecific soft tissue swelling. Degenerative changes at the subtalar joint and midfoot. Densities in the soft tissues anterior to the distal tibia which are nonspecific and presumed represent vascular calcifications. Cannot rule out foreign object. No sign of osteomyelitis. IMPRESSION: No acute or traumatic findings. No sign of osteomyelitis. Subtalar and midfoot degenerative changes. Nonspecific soft tissue swelling. Density anterior to the tibia presumed represent a vascular calcification, though foreign object cannot be excluded. This does not appear to be immediately related to the surface abnormality of the medial lower leg. Electronically Signed   By: Nelson Chimes [name]   On: 05/25/2017 15:46       Assessment/Plan  Active Problems:   Hyperlipidemia   HTN (hypertension)  Rheumatoid arthritis (HCC)   Anemia of chronic disease   CKD (chronic kidney disease) stage 3, GFR 30-59 ml/min   DM (diabetes mellitus), type 2 with renal complications (HCC)   Sepsis (HCC)  Rheumatoid nodules - Nodule just superior to medial malleoli appears infected - This may need debridement although it will be difficult for patient to heal this while on 10 mg of prednisone daily - Will discuss with [name]  Thank you for the consult.  Kalman Drape, [facility] Surgery 05/26/2017, 3:33 PM Pager: [phone] Consults: [phone] Mon-Fri 7:00 am-4:30 pm Sat-Sun 7:00 am-11:30 am

## 2017-05-27 LAB — BASIC METABOLIC PANEL
Anion gap: 6 (ref 5–15)
BUN: 28 mg/dL — ABNORMAL HIGH (ref 6–20)
CO2: 22 mmol/L (ref 22–32)
Calcium: 8.9 mg/dL (ref 8.9–10.3)
Chloride: 110 mmol/L (ref 101–111)
Creatinine, Ser: 1.26 mg/dL — ABNORMAL HIGH (ref 0.44–1.00)
GFR calc Af Amer: 44 mL/min — ABNORMAL LOW (ref >60)
GFR calc non Af Amer: 38 mL/min — ABNORMAL LOW (ref >60)
Glucose, Bld: 89 mg/dL (ref 65–99)
Potassium: 4.1 mmol/L (ref 3.5–5.1)
Sodium: 138 mmol/L (ref 135–145)

## 2017-05-27 LAB — GLUCOSE, CAPILLARY
Glucose-Capillary: 91 mg/dL (ref 65–99)
Glucose-Capillary: 142 mg/dL — ABNORMAL HIGH (ref 65–99)

## 2017-05-27 MED ORDER — HYDRALAZINE HCL 100 MG PO TABS: 100.0000 mg | ORAL_TABLET | Freq: Three times a day (TID) | ORAL | 0 refills | Status: DC

## 2017-05-27 MED ORDER — PREDNISONE 20 MG PO TABS: 20.0000 mg | ORAL_TABLET | Freq: Every day | ORAL | 0 refills | Status: DC

## 2017-05-27 MED ORDER — FUROSEMIDE 20 MG PO TABS: 20.0000 mg | ORAL_TABLET | ORAL | 5 refills | Status: DC

## 2017-05-27 MED ORDER — LINEZOLID 600 MG PO TABS: 600.0000 mg | ORAL_TABLET | Freq: Two times a day (BID) | ORAL | 0 refills | Status: DC

## 2017-05-27 MED ORDER — HYDRALAZINE HCL 20 MG/ML IJ SOLN
5.0000 mg | Freq: Once | INTRAMUSCULAR | Status: AC
  Administered 2017-05-27: 5 mg via INTRAVENOUS
  Filled 2017-05-27: qty 1

## 2017-05-27 NOTE — Care Management Note (Signed)
Case Management Note  Patient Details  Name: [name] [name] MRN: [id number] Date of Birth: [birth year]  Subjective/Objective:   Pt admitted on 05/25/17 with RLE rheumatoid nodules with ulceration and discharge, AKI.  PTA, pt resided at home alone.  She has Medicaid [facility] HH aide, 7 days a week from 9am to 12pm.  Pt ambulates with either a rollator or a cane.  PCP is Dr. Helane Rima.                  Action/Plan: Daughter at bedside; states she and her sister check on pt frequently and help with meal prep.  PCS agency assessing patient for extra hours.  Pt may need PT/OT evaluations for recommendations.  Will follow progress.    Expected Discharge Date:  05/27/17               Expected Discharge Plan:  Home w Home Health Services  In-House Referral:     Discharge planning Services  CM Consult  Post Acute Care Choice:  Home Health Choice offered to:  Patient, Adult Children  DME Arranged:    DME Agency:     HH Arranged:  PT HH Agency:  [facility]  Status of Service:  Completed, signed off  If discussed at Long Length of Stay Meetings, dates discussed:    Additional Comments:  05/27/17 J. Emylia Latella, RN, BSN  Pt medically stable for dc home today.  Family to provide care at dc.  PT recommending HH follow up and pt agreeable.  Referral to [facility], per pt/family choice.  Start of care 24-48h post dc date.  Pt denies any DME needs for home.  Contact # for daughter is [phone].  Quintella Baton, RN, BSN  Trauma/Neuro ICU Case Manager [phone]

## 2017-05-27 NOTE — Discharge Summary (Signed)
Physician Discharge Summary  [name] [name] GTX:[id number] DOB: [birth year] DOA: 05/25/2017  PCP: Helane Rima, DO  Admit date: 05/25/2017 Discharge date: 05/27/2017  Time spent: 35 minutes  Recommendations for Outpatient Follow-up:  1. Note changes to multiple antihypertensive agents in terms of dosing and frequency as below 2. Please get basic metabolic panel as well as CBC in about one week 3. Defer further Biologics and disease modifying agents to rheumatology--I have cc Dr. Corliss Skains this note 4. Recommend completion of antibacterial linezolide--sent in to pharmacy 5. Wet-to-dry dressings to the area over the purulence to allow for it to mature  Discharge Diagnoses:  Active Problems:   Hyperlipidemia   HTN (hypertension)   Rheumatoid arthritis (HCC)   Anemia of chronic disease   CKD (chronic kidney disease) stage 3, GFR 30-59 ml/min   DM (diabetes mellitus), type 2 with renal complications (HCC)   Sepsis (HCC)   Discharge Condition: Improved  Diet recommendation: Prior  Filed Weights   05/25/17 1201 05/25/17 1813  Weight: 67.1 kg (148 lb) 67.5 kg (148 lb 13 oz)    History of present illness:  87 ? DM ty II htn gout Rh arthritis with recent Nodules             prior TIA 9/14             Failed humira stopped due to chf 3/17             failer Abbe [place] since 09/2016--stopped and started on actrema 6/12             Needing multiple "bursts" of steorids since 02/2017 Prior knee surgeries ckd ii-iii at baseline presented to [facility] ED after calling rheum office with painful LE nodules on the R side  Has been Rx for this for over 1 mo  Tells me had skin biopsy by Derm in November 2017, recent biopsy 2 weeks prior showed rheum noduls Recently Rx by both PCP and Urgent care with Doxy/bactrim  Hospital Course:   Infected Rheum Nodules? DDX = pyoderma gangrenosum, mycobacterial infection, fungal infection Lactic acidosis on  admission-RESOLVED Outpatient culture grew gram positive cocci/pansensitive Escherichia coli      initially the patient was placed on vancomycin and Zosyn to Linezolide as per ID  General surgery saw the patient and recommended no do roofing or no biopsy   It is recommended that the patient have determined past biopsy for histology as well as AFB and fungal cultures and fungal stains as an outpatient  -Once these are negative as per ID it would be reasonable to use Actrema or whatever agents so desired by her rheumatologist  Acute kidney injury Mild metabolic acidosis on admission-secondary to ATN-resolving slowly             Baseline creatinine is 1.1             Secondary to Bactrim lisinopril and Lasix has had AK I         metabolic acidosis resolved kidney function is now close to baseline and we have changed medications and discontinued forever lisinopril  Lasix is now every other day as an outpatient  See below  Rheumatoid arthritis              see above discussion-? Actemra?             Continue prednisone for now as above  Hypertension             Not currently at goal given discontinuation of lisinopril             Increase labetalol 100---> 200ii/day 7/18/`18             Hydralazine 25 4 times a day--> on discharge changed to 50 mg 3 times a day  Diabetes mellitus type 2             On metformin 500 daily at home  Sugars reasonably controlled in hospital   Procedures:  None  Consultations:  Infectious disease  General surgery  Discharge Exam: Vitals:   05/27/17 0516 05/27/17 0600  BP: (!) 190/84 (!) 183/88  Pulse: 85 84  Resp: 16   Temp: 98 F (36.7 C)     Alert pleasant oriented however having some pain and contracture in her right hand Chest is clinically clear Slightly tachycardic Abdomen is soft Wound not examined today by me however has been reviewed multiple times by surgery and ID in the past 24 hours and seems to be  improving  Discharge Instructions   Discharge Instructions    Diet - low sodium heart healthy    Complete by:  As directed    Discharge instructions    Complete by:  As directed    you will notice that we have changed her Lasix dose to every other day  You will also noticed that we have increased her hydralazine dose but changed the frequency  Please do not take any further lisinopril for the time being  You have been prescribed a strong antibiotic Linezolide will wipe out the remaining little infection around the nodule It is our impression that these nodules are direct correlation between your rheumatoid arthritis and this will need close follow-up by your rheumatologist who will be copied on the discharge note I would also recommend follow-up with your primary care physician in one week.   Increase activity slowly    Complete by:  As directed      Current Discharge Medication List    START taking these medications   Details  linezolid (ZYVOX) 600 MG tablet Take 1 tablet (600 mg total) by mouth 2 (two) times daily. Qty: 10 tablet, Refills: 0      CONTINUE these medications which have CHANGED   Details  furosemide (LASIX) 20 MG tablet Take 1 tablet (20 mg total) by mouth every other day. Qty: 30 tablet, Refills: 5   Associated Diagnoses: Essential hypertension    hydrALAZINE (APRESOLINE) 100 MG tablet Take 1 tablet (100 mg total) by mouth 3 (three) times daily. Qty: 90 tablet, Refills: 0   Associated Diagnoses: Essential hypertension    predniSONE (DELTASONE) 20 MG tablet Take 1 tablet (20 mg total) by mouth daily with breakfast. Qty: 14 tablet, Refills: 0      CONTINUE these medications which have NOT CHANGED   Details  albuterol (PROVENTIL HFA;VENTOLIN HFA) 108 (90 Base) MCG/ACT inhaler Inhale 1 puff into the lungs every 6 (six) hours as needed for wheezing or shortness of breath.    allopurinol (ZYLOPRIM) 100 MG tablet take 1 tablet by mouth once daily for GOUT Qty:  30 tablet, Refills: 5   Associated Diagnoses: Idiopathic chronic gout of multiple sites without tophus    aspirin EC 81 MG tablet Take 81 mg by mouth daily.    calcium-vitamin D (OSCAL WITH D) 500-200 MG-UNIT per tablet Take 1 tablet by mouth daily.  CHLOROPHYLL PO Take 5 drops by mouth See admin instructions. MIX INTO 6 OUNCES OF JUICE AND DRINK ONCE A DAY    ferrous sulfate 325 (65 FE) MG tablet Take 325 mg by mouth daily.     folic acid (FOLVITE) 1 MG tablet Take 1 tablet (1 mg total) by mouth daily. Qty: 90 tablet, Refills: 4    hydroxypropyl methylcellulose / hypromellose (ISOPTO TEARS / GONIOVISC) 2.5 % ophthalmic solution Place 1 drop into both eyes 4 (four) times daily as needed for dry eyes.    ketoconazole (NIZORAL) 2 % cream Apply 1 application topically 2 (two) times daily. Qty: 30 g, Refills: 0    labetalol (NORMODYNE) 100 MG tablet take 2 tablets by mouth twice a day Qty: 120 tablet, Refills: 0    metFORMIN (GLUCOPHAGE) 500 MG tablet take 1 tablet by mouth once daily for diabetes Qty: 90 tablet, Refills: 1   Associated Diagnoses: Type 2 diabetes mellitus with stage 3 chronic kidney disease, without long-term current use of insulin (HCC)    Multiple Vitamins-Minerals (ALIVE ONCE DAILY WOMENS PO) Take 15 mLs by mouth every other day.     traMADol (ULTRAM) 50 MG tablet Take 1-2 tablets every 6 hours as needed for pain Qty: 240 tablet, Refills: 5   Associated Diagnoses: Rheumatoid arthritis of multiple sites with negative rheumatoid factor (HCC)    triamcinolone cream (KENALOG) 0.1 % apply to affected area twice a day if needed for IRRITATION Qty: 45 g, Refills: 2    vitamin C (ASCORBIC ACID) 500 MG tablet Take 500 mg by mouth every other day.       STOP taking these medications     lisinopril (PRINIVIL,ZESTRIL) 30 MG tablet      OVER THE COUNTER MEDICATION      glucose blood (ONE TOUCH ULTRA TEST) test strip      Tocilizumab (ACTEMRA) 162 MG/0.9ML SOSY         Allergies  Allergen Reactions  . Clonidine Derivatives Swelling    [name]  . Fish Allergy Diarrhea, Swelling and Other (See Comments)    Turns skin "black," but can tolerate white fish Salmon- Diarrhea  . Shellfish Allergy Hives  . Doxycycline Rash  . Indomethacin     Reaction not recalled by the patient  . Lyrica [Pregabalin]     Hallucinations   . Methyldopa     Aldomet (for hypertension): Reaction not recalled by the patient  . Orange Fruit [Citrus] Other (See Comments)    Indigestion/heartburn  . Cetirizine Hcl Itching and Rash  . Codeine Itching  . Levaquin [Levofloxacin In D5w] Rash  . Tomato Rash      The results of significant diagnostics from this hospitalization (including imaging, microbiology, ancillary and laboratory) are listed below for reference.    Significant Diagnostic Studies: Dg Chest 2 View  Result Date: 05/25/2017 CLINICAL DATA:  Fatigue, several days duration EXAM: CHEST  2 VIEW COMPARISON:  01/10/2017 FINDINGS: Chronic cardiomegaly and aortic atherosclerosis. The vascularity is normal. The lungs are clear. No effusions. Chronic degenerative changes affect the spine. Extensive degenerative changes of the shoulders. IMPRESSION: Chronic cardiomegaly and aortic atherosclerosis.  No active disease. Electronically Signed   By: Paulina Fusi [name]   On: 05/25/2017 15:47   Dg Ankle Complete Right  Result Date: 05/25/2017 CLINICAL DATA:  Soft tissue lesion. EXAM: RIGHT ANKLE - COMPLETE 3+ VIEW COMPARISON:  None. FINDINGS: Nonspecific soft tissue swelling. Degenerative changes at the  subtalar joint and midfoot. Densities in the soft tissues anterior to the distal tibia which are nonspecific and presumed represent vascular calcifications. Cannot rule out foreign object. No sign of osteomyelitis. IMPRESSION: No acute or traumatic findings. No sign of osteomyelitis. Subtalar and midfoot  degenerative changes. Nonspecific soft tissue swelling. Density anterior to the tibia presumed represent a vascular calcification, though foreign object cannot be excluded. This does not appear to be immediately related to the surface abnormality of the medial lower leg. Electronically Signed   By: Paulina Fusi [name]   On: 05/25/2017 15:46    Microbiology: Recent Results (from the past 240 hour(s))  Blood culture (routine x 2)     Status: None (Preliminary result)   Collection Time: 05/25/17  4:29 PM  Result Value Ref Range Status   Specimen Description BLOOD LEFT ANTECUBITAL  Final   Special Requests   Final    BOTTLES DRAWN AEROBIC AND ANAEROBIC Blood Culture adequate volume   Culture NO GROWTH 2 DAYS  Final   Report Status PENDING  Incomplete  Blood culture (routine x 2)     Status: None (Preliminary result)   Collection Time: 05/25/17  4:31 PM  Result Value Ref Range Status   Specimen Description BLOOD BLOOD RIGHT FOREARM  Final   Special Requests   Final    BOTTLES DRAWN AEROBIC AND ANAEROBIC Blood Culture adequate volume   Culture NO GROWTH 2 DAYS  Final   Report Status PENDING  Incomplete     Labs: Basic Metabolic Panel:  Recent Labs Lab 05/25/17 1209 05/26/17 0446 05/27/17 0423  NA 132* 136 138  K 4.7 4.5 4.1  CL 100* 104 110  CO2 21* 24 22  GLUCOSE 114* 85 89  BUN 32* 33* 28*  CREATININE 1.87* 1.75* 1.26*  CALCIUM 9.7 9.1 8.9   Liver Function Tests:  Recent Labs Lab 05/25/17 1209  AST 21  ALT 14  ALKPHOS 53  BILITOT 0.5  PROT 7.1  ALBUMIN 3.6   No results for input(s): LIPASE, AMYLASE in the last 168 hours. No results for input(s): AMMONIA in the last 168 hours. CBC:  Recent Labs Lab 05/25/17 1209 05/26/17 0446  WBC 13.2* 10.2  NEUTROABS 10.6*  --   HGB 11.3* 10.6*  HCT 33.7* 32.1*  MCV 92.6 93.9  PLT 429* 351   Cardiac Enzymes: No results for input(s): CKTOTAL, CKMB, CKMBINDEX, TROPONINI in the last 168 hours. BNP: BNP (last 3  results) No results for input(s): BNP in the last 8760 hours.  ProBNP (last 3 results) No results for input(s): PROBNP in the last 8760 hours.  CBG:  Recent Labs Lab 05/26/17 0753 05/26/17 1146 05/26/17 1717 05/26/17 2140 05/27/17 0802  GLUCAP 89 111* 134* 134* 91       Signed:  Rhetta Mura MD   [facility] 05/27/2017, 11:37 AM

## 2017-05-27 NOTE — Progress Notes (Signed)
    [facility]   Reason for visit: Follow up on rassh  Interval History: no changes  Physical Exam: Constitutional:  Vitals:   05/27/17 0516 05/27/17 0600  BP: (!) 190/84 (!) 183/88  Pulse: 85 84  Resp: 16   Temp: 98 F (36.7 C)    patient appears in NAD  Impression: possible RA nodules, non infectious etiology such as pyoderma gangrenosum, vs infection such as Mycobacterial disease, fungal infection as outlined in my initial consutlation.   Plan: 1.  As an outpatient, depending on previous work up, I would be sure she has had a derm path biopsy for histology for above as well as for AFB and Fungal cultures and AFB and Funagl stains.   If those are negative after 2-3 weeks, I would favor proceeding with appropriate RA treatment and monitor closely.  I agree with a short course of linezolid but this is not to treat the lesions but any superinfection since there was a minimal amount of drainage.

## 2017-05-27 NOTE — Progress Notes (Signed)
Patient discharged to home with instructions given to patient. 

## 2017-05-27 NOTE — Care Management Note (Signed)
Case Management Note  Patient Details  Name: [name] [name] MRN: [id number] Date of Birth: 1930/07/24  Subjective/Objective:   Pt admitted on 05/25/17 with RLE rheumatoid nodules with ulceration and discharge, AKI.  PTA, pt resided at home alone.  She has Medicaid [facility] HH aide, 7 days a week from 9am to 12pm.  Pt ambulates with either a rollator or a cane.  PCP is Dr. Helane Rima.                  Action/Plan: Daughter at bedside; states she and her sister check on pt frequently and help with meal prep.  PCS agency assessing patient for extra hours.  Pt may need PT/OT evaluations for recommendations.  Will follow progress.    Expected Discharge Date:  05/27/17               Expected Discharge Plan:  Home w Home Health Services  In-House Referral:     Discharge planning Services  CM Consult  Post Acute Care Choice:  Home Health Choice offered to:  Patient, Adult Children  DME Arranged:    DME Agency:     HH Arranged:  PT, OT, Social Work [employer] Agency:  [facility]  Status of Service:  Completed, signed off  If discussed at [employer], dates discussed:    Additional Comments:  05/27/17 J. Boniface Goffe, RN, BSN  Pt medically stable for dc home today.  Family to provide care at dc.  PT/OT  recommending HH follow up and pt agreeable.  Referral to [facility], per pt/family choice.  Start of care 24-48h post dc date.  Pt denies any DME needs for home.  Contact # for daughter is [phone].  Quintella Baton, RN, BSN  Trauma/Neuro ICU Case Manager [phone]

## 2017-05-27 NOTE — Progress Notes (Signed)
PT Cancellation Note  Patient Details Name: [name] [name] MRN: [id number] DOB: Jul 04, 1930   Cancelled Treatment:    Reason Eval/Treat Not Completed: Patient declined, no reason specified Pt declines PT session this PM as she is supposed to d/c home and wants to save her energy to get into her house. Will follow if still in hospital.   Blake Divine A Yani Coventry 05/27/2017, 2:22 PM Mylo Red, PT, DPT [phone]

## 2017-05-28 ENCOUNTER — Telehealth: Payer: Self-pay

## 2017-05-28 ENCOUNTER — Telehealth: Payer: Self-pay | Admitting: Family Medicine

## 2017-05-28 NOTE — Telephone Encounter (Signed)
Spoke with [place].  Gave verbal order for PT and home nursing for patient.  Also asked for speech evaluation as patient has been getting choked on her food recently.  This happened when the [place] was with her and patient confirmed it had been happening a lot recently.  Verbal order given for speech therapy eval.

## 2017-05-28 NOTE — Telephone Encounter (Signed)
[facility] calling to get orders for patient. Please call and advise.

## 2017-05-28 NOTE — Telephone Encounter (Signed)
Transition Care Management Follow-Up Telephone Call   Date discharged and where: [facility] on 7/19/8  How have you been since you were released from the hospital? Does not feel good today. Feel weak and tired, no pain.   Any patient concerns? No  Items Reviewed:   Meds: Y   Allergies: Y  Dietary Changes Reviewed: Y  Functional Questionnaire:  Independent-I Dependent-D  ADLs:   Dressing- I w/ assist    Eating-I   Maintaining continence-I   Transferring-I w/ walker, cane, whelchair   Transportation- D   Meal Prep- D   Managing Meds- I  Confirmed importance and Date/Time of follow-up visits scheduled: Pt stated at the end of the call that she does not come to [facility] [address] anymore. I encouraged her to follow up with her PCP   Confirmed with patient if condition worsens to call PCP or go to the Emergency Dept. Patient was given office number and encouraged to call back with questions or concerns: Yes

## 2017-05-30 LAB — CULTURE, BLOOD (ROUTINE X 2)
Culture: NO GROWTH
Culture: NO GROWTH
Special Requests: ADEQUATE
Special Requests: ADEQUATE

## 2017-05-31 ENCOUNTER — Telehealth: Payer: Self-pay | Admitting: Family Medicine

## 2017-05-31 ENCOUNTER — Telehealth: Payer: Self-pay | Admitting: Rheumatology

## 2017-05-31 DIAGNOSIS — M7989 Other specified soft tissue disorders: Secondary | ICD-10-CM

## 2017-05-31 DIAGNOSIS — M799 Soft tissue disorder, unspecified: Secondary | ICD-10-CM

## 2017-05-31 NOTE — Telephone Encounter (Signed)
[name] states she was just discharged from the hospital and would like to know if she needs a follow up appointment with Dr. Corliss Skains. She is requesting a call back from [place].

## 2017-05-31 NOTE — Telephone Encounter (Signed)
[name] from [facility] called needing verbal orders for 1 additional visit for patient in 2 weeks.   [name] stated BP of patient  was170/95 today with pulse of 101. Worked out with physical therapist prior to this but wanted [name] [name] to be aware. No other symptoms.  Please call and advise.

## 2017-06-01 ENCOUNTER — Telehealth: Payer: Self-pay | Admitting: Family Medicine

## 2017-06-01 NOTE — Telephone Encounter (Signed)
Spoke with Mindi Junker.  Verbal order given.

## 2017-06-01 NOTE — Telephone Encounter (Signed)
Left confidential voicemail giving verbal order.

## 2017-06-01 NOTE — Telephone Encounter (Signed)
Para March patients daughter, calling for information about moms tx. [phone]. Please call.

## 2017-06-01 NOTE — Telephone Encounter (Signed)
Para March states that Ms. [name] [name] did not receive a a referral to Infectious disease before leaving the hospital. Para March states to her knowledge no one to her knowledge from infectious disease evaluated the patient. Para March is requesting a referral to Infectious diease to help get Ms. [name] [name]' wounds healed so she may start back on her medications.

## 2017-06-01 NOTE — Telephone Encounter (Signed)
Please send a referral to ID for lower extremity lesions

## 2017-06-01 NOTE — Telephone Encounter (Signed)
[facility] calling to get orders to have physical therapy for patient 1 time a week for 4 weeks including evaluation she just had.    Please call Mindi Junker and advise.

## 2017-06-02 NOTE — Telephone Encounter (Addendum)
Referral placed. Jeanette advised.

## 2017-06-02 NOTE — Addendum Note (Signed)
Addended by: Henriette Combs on: 06/02/2017 09:30 AM   Modules accepted: Orders

## 2017-06-04 ENCOUNTER — Telehealth: Payer: Self-pay | Admitting: Rheumatology

## 2017-06-04 NOTE — Telephone Encounter (Signed)
Patients daughter would like to touch base with you on the status of the infectious Diease referral. They have not been contacted yet. Please call to advise.

## 2017-06-04 NOTE — Telephone Encounter (Signed)
Wound looks worse. [facility] Nurse visited today. [phone]. [name].

## 2017-06-04 NOTE — Telephone Encounter (Signed)
At 4:10 PM today, I called patient's daughter, Marylu Lund, at [phone].Patient states that the home care nurse states that her wound looks worse. She is eating well and urinating well and does not have any fever.She is having increased pain from her rheumatoid arthritis and where the sores are.  They have not received any appointments from infectious disease yet. Patient wanted to know what she needs to do next.I reviewed the patient's chart and saw where the hospitalist called and spoke with Dr. Corliss Skains.From speaking with Sue Lush and Jasmine December, I have the impression that infectious disease was to be contacted by the hospitalist group so that patient can get evaluation and treatment. According to the patient, she has not received any phone calls.I called the infectious disease doctor on call, Dr. Johny Sax, and spoke to him regarding patient's situation.Dr. Ninetta Lights advised the patient be referred to the emergency room for evaluation and treatment due to the infection appearing worse to the home care nurse. And also because she's been on 3 different rounds of antibiotic with no improvement. In the meanwhile, he will have his office staff contact the patient for an appointment early next week.I called Para March and explained to her our plan and she is agreeable to take her mom to the emergency department today for evaluation and treatment. She will take her as soon as we finish our phone call. She will wait for Dr. Moshe Cipro office to call for the appointment. If she doesn't hear from them, she plans to give them a call.

## 2017-06-05 ENCOUNTER — Emergency Department (HOSPITAL_COMMUNITY)
Admission: EM | Admit: 2017-06-05 | Discharge: 2017-06-05 | Disposition: A | Discharge status: Home / Self Care | Payer: Medicare Other | Attending: Emergency Medicine | Admitting: Emergency Medicine

## 2017-06-05 ENCOUNTER — Encounter (HOSPITAL_COMMUNITY): Payer: Self-pay

## 2017-06-05 DIAGNOSIS — L97912 Non-pressure chronic ulcer of unspecified part of right lower leg with fat layer exposed: Secondary | ICD-10-CM | POA: Diagnosis not present

## 2017-06-05 DIAGNOSIS — E1122 Type 2 diabetes mellitus with diabetic chronic kidney disease: Secondary | ICD-10-CM | POA: Diagnosis not present

## 2017-06-05 DIAGNOSIS — I2510 Atherosclerotic heart disease of native coronary artery without angina pectoris: Secondary | ICD-10-CM | POA: Diagnosis not present

## 2017-06-05 DIAGNOSIS — I130 Hypertensive heart and chronic kidney disease with heart failure and stage 1 through stage 4 chronic kidney disease, or unspecified chronic kidney disease: Secondary | ICD-10-CM | POA: Diagnosis not present

## 2017-06-05 DIAGNOSIS — Z7984 Long term (current) use of oral hypoglycemic drugs: Secondary | ICD-10-CM | POA: Diagnosis not present

## 2017-06-05 DIAGNOSIS — Z96653 Presence of artificial knee joint, bilateral: Secondary | ICD-10-CM | POA: Diagnosis not present

## 2017-06-05 DIAGNOSIS — I509 Heart failure, unspecified: Secondary | ICD-10-CM | POA: Insufficient documentation

## 2017-06-05 DIAGNOSIS — Z7982 Long term (current) use of aspirin: Secondary | ICD-10-CM | POA: Diagnosis not present

## 2017-06-05 DIAGNOSIS — N183 Chronic kidney disease, stage 3 (moderate): Secondary | ICD-10-CM | POA: Diagnosis not present

## 2017-06-05 DIAGNOSIS — M79604 Pain in right leg: Secondary | ICD-10-CM | POA: Diagnosis present

## 2017-06-05 DIAGNOSIS — Z79899 Other long term (current) drug therapy: Secondary | ICD-10-CM | POA: Diagnosis not present

## 2017-06-05 LAB — CBC WITH DIFFERENTIAL/PLATELET
Basophils Absolute: 0 10*3/uL (ref 0.0–0.1)
Basophils Relative: 0 %
Eosinophils Absolute: 0.1 10*3/uL (ref 0.0–0.7)
Eosinophils Relative: 1 %
HCT: 30.5 % — ABNORMAL LOW (ref 36.0–46.0)
Hemoglobin: 10.3 g/dL — ABNORMAL LOW (ref 12.0–15.0)
Lymphocytes Relative: 24 %
Lymphs Abs: 3 10*3/uL (ref 0.7–4.0)
MCH: 31.3 pg (ref 26.0–34.0)
MCHC: 33.8 g/dL (ref 30.0–36.0)
MCV: 92.7 fL (ref 78.0–100.0)
Monocytes Absolute: 1 10*3/uL (ref 0.1–1.0)
Monocytes Relative: 8 %
Neutro Abs: 8.2 10*3/uL — ABNORMAL HIGH (ref 1.7–7.7)
Neutrophils Relative %: 67 %
Platelets: 313 10*3/uL (ref 150–400)
RBC: 3.29 MIL/uL — ABNORMAL LOW (ref 3.87–5.11)
RDW: 17.6 % — ABNORMAL HIGH (ref 11.5–15.5)
WBC: 12.3 10*3/uL — ABNORMAL HIGH (ref 4.0–10.5)

## 2017-06-05 LAB — BASIC METABOLIC PANEL
Anion gap: 10 (ref 5–15)
BUN: 29 mg/dL — ABNORMAL HIGH (ref 6–20)
CO2: 23 mmol/L (ref 22–32)
Calcium: 9.2 mg/dL (ref 8.9–10.3)
Chloride: 104 mmol/L (ref 101–111)
Creatinine, Ser: 1.06 mg/dL — ABNORMAL HIGH (ref 0.44–1.00)
GFR calc Af Amer: 54 mL/min — ABNORMAL LOW (ref >60)
GFR calc non Af Amer: 47 mL/min — ABNORMAL LOW (ref >60)
Glucose, Bld: 98 mg/dL (ref 65–99)
Potassium: 3.7 mmol/L (ref 3.5–5.1)
Sodium: 137 mmol/L (ref 135–145)

## 2017-06-05 NOTE — ED Triage Notes (Signed)
Patient presented with complain of wound infection to the right lower leg infection. Patient state she is been having pain in the last 2 days. Patient just complete antibiotic yesterday.

## 2017-06-05 NOTE — ED Provider Notes (Signed)
[facility]-EMERGENCY DEPT Provider Note   CSN: [id number] Arrival date & time: 06/05/17  0900  By signing my name below, I, [name] [name], attest that this documentation has been prepared under the direction and in the presence of Raeford Razor, MD. Electronically signed, [name] [name], ED Scribe. 06/05/17. 9:50 AM.   History   Chief Complaint No chief complaint on file.   HPI HPI Comments: [name] [name] is a 83 y.o. female with Hx of RA, CKD, HTN, who presents to the Emergency Department complaining of chronic, unresolving wounds to her right lower extremity. Pt was admitted to the hospital on 05/25/2017, eleven days ago, for the same lesions. During time of admission she was having purulent discharge from the lesions. Pt saw her PCP prior to admission who placed her on several different antibiotics including Keflex, Bactrim and Doxycycline, which did not improve the wound healing. After being discharged from the hospital, pt has had a travel nurse performing follow up wound care. This week the nurse noted that the wounds looked worse. Pt notes that her RA doctor instructed here to come back if the wounds worsened, which is why she presents here today. Pt did not see her RA doctor prior to coming here today. Pt has had no further drainage from the wounds. Another concern she has is starting her Methotrexate again for her RA; she has been unable to take it since the wound infections. She was instructed by her RA doctor that she would be unable to start the medication until the wounds are healed. Pt plans to follow up with wound care. She notes that she has been performing proper wound care at home. She is afebrile here today. Pt has no further complaints at this time.   The history is provided by the patient and a relative. No language interpreter was used.    Past Medical History:  Diagnosis Date  . Allergic rhinitis due to pollen 04/27/2007  . Arthritis    "in q joint" (08/07/2013)  . Coronary  atherosclerosis of native coronary artery   . Cough   . Disorder of bone and cartilage, unspecified   . Edema 05/03/2013  . Exertional shortness of breath    "sometimes" (08/07/2013)  . First degree atrioventricular block   . Gout, unspecified 04/19/2013  . Hyperlipidemia 04/27/2007  . Hypertension   . Muscle spasm   . Muscle weakness (generalized)   . Myocardial infarction (HCC) ~ 1970  . Onychia and paronychia of toe   . Osteoarthrosis, unspecified whether generalized or localized, unspecified site 04/27/2007  . Other fall   . Other malaise and fatigue   . Prepatellar bursitis   . Seizures (HCC)   . Stroke (HCC) 01/17/2014  . TIA (transient ischemic attack)    "a few one summer" (08/07/2013)  . Type II diabetes mellitus (HCC)    "fasting 90-110s" (08/07/2013)  . Unspecified essential hypertension 08/08/2013  . Unspecified vitamin D deficiency     Patient Active Problem List   Diagnosis Date Noted  . Sepsis (HCC) 05/26/2017  . History of CHF (congestive heart failure) 02/18/2017  . History of diabetes mellitus 02/18/2017  . History of total knee arthroplasty, bilateral 02/18/2017  . History of rotator cuff surgery 02/18/2017  . Obesity (BMI 30.0-34.9) 12/08/2016  . High risk medications (not anticoagulants) long-term use 09/21/2016  . Hammer toe 10/31/2015  . Acute on chronic diastolic heart failure (HCC) 09/12/2015  . Onychomycosis 04/24/2015  . Midline low back pain without sciatica 06/27/2014  .  Bilateral edema of lower extremity 06/05/2014  . CKD (chronic kidney disease) stage 3, GFR 30-59 ml/min 06/05/2014  . DM (diabetes mellitus), type 2 with renal complications (HCC) 06/05/2014    Class: Chronic  . Anemia of chronic disease 05/01/2014  . Fluctuating blood pressure 02/06/2014  . Stroke (HCC) 01/17/2014  . Rheumatoid arthritis (HCC) 12/19/2013    Class: Chronic  . CAD (coronary artery disease) 10/18/2013  . HTN (hypertension) 08/08/2013    Class: Chronic  .  Fibromyalgia 05/10/2013  . Reactive airway disease 04/19/2013  . Gout 04/19/2013  . Hyperlipidemia 04/27/2007  . Allergic rhinitis 04/27/2007  . Diverticulosis 04/27/2007    Past Surgical History:  Procedure Laterality Date  . ABDOMINAL HYSTERECTOMY  04/1980  . APPENDECTOMY  1960  . CARDIAC CATHETERIZATION  2003  . CATARACT EXTRACTION W/ INTRAOCULAR LENS  IMPLANT, BILATERAL Bilateral ~ 2012  . JOINT REPLACEMENT    . REPLACEMENT TOTAL KNEE Right 09/2005  . SHOULDER OPEN ROTATOR CUFF REPAIR Right 07/1999  . TONSILLECTOMY  08/1974  . TOTAL KNEE ARTHROPLASTY Left 08/07/2013   Procedure: TOTAL KNEE ARTHROPLASTY- LEFT;  Surgeon: Dannielle Huh, MD;  Location: [facility] OR;  Service: Orthopedics;  Laterality: Left;  . TRANSTHORACIC ECHOCARDIOGRAM  2003   EF 55-65%; mild concentric LVH    OB History    No data available       Home Medications    Prior to Admission medications   Medication Sig Start Date End Date Taking? Authorizing Provider  albuterol (PROVENTIL HFA;VENTOLIN HFA) 108 (90 Base) MCG/ACT inhaler Inhale 1 puff into the lungs every 6 (six) hours as needed for wheezing or shortness of breath.    [provider]  allopurinol (ZYLOPRIM) 100 MG tablet take 1 tablet by mouth once daily for GOUT Patient taking differently: Take 100 mg by mouth every evening.  03/11/17   Helane Rima, DO  aspirin EC 81 MG tablet Take 81 mg by mouth daily.    [provider]  calcium-vitamin D (OSCAL WITH D) 500-200 MG-UNIT per tablet Take 1 tablet by mouth daily.     [provider]  CHLOROPHYLL PO Take 5 drops by mouth See admin instructions. MIX INTO 6 OUNCES OF JUICE AND DRINK ONCE A DAY    [provider]  ferrous sulfate 325 (65 FE) MG tablet Take 325 mg by mouth daily.     [provider]  folic acid (FOLVITE) 1 MG tablet Take 1 tablet (1 mg total) by mouth daily. 09/21/16 12/15/17  Panwala, Naitik, PA-C  furosemide (LASIX) 20 MG tablet Take 1 tablet (20 mg  total) by mouth every other day. 05/27/17   Rhetta Mura, MD  hydrALAZINE (APRESOLINE) 100 MG tablet Take 1 tablet (100 mg total) by mouth 3 (three) times daily. 05/27/17   Rhetta Mura, MD  hydroxypropyl methylcellulose / hypromellose (ISOPTO TEARS / GONIOVISC) 2.5 % ophthalmic solution Place 1 drop into both eyes 4 (four) times daily as needed for dry eyes.    [provider]  ketoconazole (NIZORAL) 2 % cream Apply 1 application topically 2 (two) times daily. Patient taking differently: Apply 1 application topically 2 (two) times daily as needed for irritation.  12/03/16   Elvina Sidle, MD  labetalol (NORMODYNE) 100 MG tablet take 2 tablets by mouth twice a day Patient taking differently: Take 200 mg by mouth 2 (two) times daily. take 2 tablets by mouth twice a day 05/11/17   Helane Rima, DO  linezolid (ZYVOX) 600 MG tablet Take 1 tablet (  600 mg total) by mouth 2 (two) times daily. 05/27/17   Rhetta Mura, MD  metFORMIN (GLUCOPHAGE) 500 MG tablet take 1 tablet by mouth once daily for diabetes Patient taking differently: Take 500 mg by mouth daily with breakfast.  03/11/17   Helane Rima, DO  Multiple Vitamins-Minerals (ALIVE ONCE DAILY WOMENS PO) Take 15 mLs by mouth every other day.     [provider]  predniSONE (DELTASONE) 20 MG tablet Take 1 tablet (20 mg total) by mouth daily with breakfast. 05/27/17   Rhetta Mura, MD  traMADol (ULTRAM) 50 MG tablet Take 1-2 tablets every 6 hours as needed for pain Patient taking differently: Take 100 mg by mouth 2 (two) times daily. MORNING AND BEDTIME 03/11/17   Helane Rima, DO  triamcinolone cream (KENALOG) 0.1 % apply to affected area twice a day if needed for IRRITATION Patient taking differently: Apply to affected area twice a day if needed for IRRITATION 01/18/17   Kimber Relic, MD  vitamin C (ASCORBIC ACID) 500 MG tablet Take 500 mg by mouth every other day.     [provider]     Family History Family History  Problem Relation Age of Onset  . Heart attack Father     Social History Social History  Substance Use Topics  . Smoking status: Never Smoker  . Smokeless tobacco: Never Used  . Alcohol use No     Allergies   Clonidine derivatives; Fish allergy; Shellfish allergy; Doxycycline; Indomethacin; Lyrica [pregabalin]; Methyldopa; Orange fruit [citrus]; Cetirizine hcl; Codeine; Levaquin [levofloxacin in d5w]; and Tomato   Review of Systems Review of Systems A complete 10 system review of systems was obtained and all systems are negative except as noted in the HPI and PMH.   Physical Exam Updated Vital Signs BP (!) 170/82 (BP Location: Left Arm)   Pulse 86   Temp 98.1 F (36.7 C) (Oral)   Resp 20   Ht 5\' 4"  (1.626 m)   Wt 170 lb (77.1 kg)   SpO2 100%   BMI 29.18 kg/m   Physical Exam  Constitutional: She is oriented to person, place, and time. She appears well-developed and well-nourished. No distress.  HENT:  Head: Normocephalic and atraumatic.  Eyes: EOM are normal.  Neck: Normal range of motion.  Cardiovascular: Normal rate, regular rhythm and normal heart sounds.   Pulmonary/Chest: Effort normal and breath sounds normal.  Abdominal: Soft. She exhibits no distension. There is no tenderness.  Musculoskeletal: Normal range of motion.  Neurological: She is alert and oriented to person, place, and time.  Skin: Skin is warm and dry. No erythema.  Multiple subcutaneous nodules to bilateral distal shins. 2 on the right side with quarter sized ulcerations. No drainage. No erythema. No increased warmth. Palpable dp pulses.  Psychiatric: She has a normal mood and affect. Judgment normal.  Nursing note and vitals reviewed.   ED Treatments / Results  DIAGNOSTIC STUDIES: Oxygen Saturation is 100% on RA, normal by my interpretation.  COORDINATION OF CARE: 9:49 AM-Will order lab work. Discussed treatment plan with pt at bedside and pt agreed to  plan.   Labs (all labs ordered are listed, but only abnormal results are displayed) Labs Reviewed  CBC WITH DIFFERENTIAL/PLATELET  BASIC METABOLIC PANEL    EKG  EKG Interpretation None       Radiology No results found.  Procedures Procedures (including critical care time)  Medications Ordered in ED Medications - No data to display   Initial Impression / Assessment  and Plan / ED Course  I have reviewed the triage vital signs and the nursing notes.  Pertinent labs & imaging results that were available during my care of the patient were reviewed by me and considered in my medical decision making (see chart for details).     Chronic ulcerations of leg. Likely from RA. Do not appear acutely infected.   Final Clinical Impressions(s) / ED Diagnoses   Final diagnoses:  Ulcer of leg, chronic, right, with fat layer exposed (HCC)    New Prescriptions New Prescriptions   No medications on file    I personally preformed the services scribed in my presence. The recorded information has been reviewed is accurate. Raeford Razor, MD.     Raeford Razor, MD 06/13/17 [phone]

## 2017-06-05 NOTE — Discharge Instructions (Signed)
I think the wounds on your leg are related to your RA. They do not appear infected. I do not think there is any indication/benefit to placing you on additional antibiotics. It is going to take time for these wounds to heal and unfortunately the medications you would normally take for RA and the prednisone you are currently taking are going to make this even slower. It is simply going to take time for these to health. For now, I would do simple wet-to-dry dressings daily. You may benefit from going to the wound care center as well. Your labs today are improved from your recent hospitalization.

## 2017-06-06 NOTE — Telephone Encounter (Signed)
Late entry: I received a phone call from patient's daughter Marylu Lund. She stated that she took her mother to [facility] ER due to worsening of her leg wound. She was concerned as the ER physician wanted to to send her home and advised wound care. I called the ER physician and had a long discussion about patient's condition. He felt that wound were not infected and only needed wound care. He agreed to obtain some labs and referred her to wound care  and ID.Please call to check next week if pt. Got appointment with ID.

## 2017-06-07 ENCOUNTER — Other Ambulatory Visit (INDEPENDENT_AMBULATORY_CARE_PROVIDER_SITE_OTHER): Payer: Self-pay | Admitting: *Deleted

## 2017-06-07 DIAGNOSIS — M25551 Pain in right hip: Secondary | ICD-10-CM

## 2017-06-07 NOTE — Telephone Encounter (Signed)
Patient is comfortable. Patient has an appointment with the wound center on 06/21/17. They are awaiting a call from the infectious disease doctor to schedule an appointment. [name] Para March will call and advise [place] when the appointment is and will call to advise [place] how appointment go.

## 2017-06-08 ENCOUNTER — Telehealth: Payer: Self-pay | Admitting: Family Medicine

## 2017-06-08 ENCOUNTER — Ambulatory Visit (INDEPENDENT_AMBULATORY_CARE_PROVIDER_SITE_OTHER): Payer: Medicare Other | Admitting: Family Medicine

## 2017-06-08 ENCOUNTER — Encounter: Payer: Self-pay | Admitting: Family Medicine

## 2017-06-08 VITALS — BP 140/82 | HR 93 | Temp 98.3°F | Ht 64.0 in | Wt 183.2 lb

## 2017-06-08 DIAGNOSIS — D638 Anemia in other chronic diseases classified elsewhere: Secondary | ICD-10-CM | POA: Diagnosis not present

## 2017-06-08 DIAGNOSIS — S81801D Unspecified open wound, right lower leg, subsequent encounter: Secondary | ICD-10-CM | POA: Diagnosis not present

## 2017-06-08 DIAGNOSIS — I2510 Atherosclerotic heart disease of native coronary artery without angina pectoris: Secondary | ICD-10-CM

## 2017-06-08 NOTE — Progress Notes (Signed)
[name] [name] is a 83 y.o. female is here for follow up.  History of Present Illness:   Insurance claims handler, CMA, acting as scribe for Dr. Earlene Plater.  HPI:  Patient comes in today for follow up of nonhealing leg wounds.  She was recently in the hospital and states when she was discharged she was told she needs to go to the wound care center.  Daughter states the earliest appointment they have for her is 06/20/2017.  They would like to know if we can help the patient get in sooner.  Patient cannot start taking her RA medication until her wounds have healed.  Patient is in a lot of pain due to her RA.  Patient states she went to the hospital because she had no appetite for 3-4 days and the pain from her RA was "dragging her down".  States she is feeling better now.  States the nodules on her legs are not very painful.    Health Maintenance Due  Topic Date Due  . OPHTHALMOLOGY EXAM  09/12/2015  . URINE MICROALBUMIN  07/04/2016  . PNA vac Low Risk Adult (2 of 2 - PCV13) 09/11/2016  . HEMOGLOBIN A1C  01/27/2017  . INFLUENZA VACCINE  06/09/2017   Depression screen [facility] 2/9 03/29/2017 03/04/2016 07/03/2015  Decreased Interest 0 0 0  Down, Depressed, Hopeless 0 0 0  PHQ - 2 Score 0 0 0  Some recent data might be hidden   PMHx, SurgHx, SocialHx, FamHx, Medications, and Allergies were reviewed in the Visit Navigator and updated as appropriate.   Patient Active Problem List   Diagnosis Date Noted  . Acute on chronic diastolic heart failure (HCC) 09/12/2015    Priority: High  . CKD (chronic kidney disease) stage 3, GFR 30-59 ml/min 06/05/2014    Priority: High  . DM (diabetes mellitus), type 2 with renal complications (HCC) 06/05/2014    Priority: High    Class: Chronic  . Rheumatoid arthritis (HCC) 12/19/2013    Priority: High    Class: Chronic  . HTN (hypertension) 08/08/2013    Priority: High    Class: Chronic  . Sepsis (HCC) 05/26/2017  . History of CHF (congestive heart failure) 02/18/2017    . History of diabetes mellitus 02/18/2017  . History of total knee arthroplasty, bilateral 02/18/2017  . History of rotator cuff surgery 02/18/2017  . Obesity (BMI 30.0-34.9) 12/08/2016  . High risk medications (not anticoagulants) long-term use 09/21/2016  . Hammer toe 10/31/2015  . Onychomycosis 04/24/2015  . Midline low back pain without sciatica 06/27/2014  . Bilateral edema of lower extremity 06/05/2014  . Anemia of chronic disease 05/01/2014  . Fluctuating blood pressure 02/06/2014  . Stroke (HCC) 01/17/2014  . CAD (coronary artery disease) 10/18/2013  . Fibromyalgia 05/10/2013  . Reactive airway disease 04/19/2013  . Gout 04/19/2013  . Hyperlipidemia 04/27/2007  . Allergic rhinitis 04/27/2007  . Diverticulosis 04/27/2007   Social History  Substance Use Topics  . Smoking status: Never Smoker  . Smokeless tobacco: Never Used  . Alcohol use No   Current Medications and Allergies:   .  albuterol (PROVENTIL HFA;VENTOLIN HFA) 108 (90 Base) MCG/ACT inhaler, Inhale 1 puff into the lungs every 6 (six) hours as needed for wheezing or shortness of breath., Disp: , Rfl:  .  allopurinol (ZYLOPRIM) 100 MG tablet, take 1 tablet by mouth once daily for GOUT (Patient taking differently: Take 100 mg by mouth every evening. ), Disp: 30 tablet, Rfl: 5 .  aspirin EC  81 MG tablet, Take 81 mg by mouth daily., Disp: , Rfl:  .  calcium-vitamin D (OSCAL WITH D) 500-200 MG-UNIT per tablet, Take 1 tablet by mouth daily. , Disp: , Rfl:  .  CHLOROPHYLL PO, Take 5 drops by mouth See admin instructions. MIX INTO 6 OUNCES OF JUICE AND DRINK ONCE A DAY, Disp: , Rfl:  .  ferrous sulfate 325 (65 FE) MG tablet, Take 325 mg by mouth daily. , Disp: , Rfl:  .  folic acid (FOLVITE) 1 MG tablet, Take 1 tablet (1 mg total) by mouth daily., Disp: 90 tablet, Rfl: 4 .  furosemide (LASIX) 20 MG tablet, Take 1 tablet (20 mg total) by mouth every other day., Disp: 30 tablet, Rfl: 5 .  hydrALAZINE (APRESOLINE) 100 MG  tablet, Take 1 tablet (100 mg total) by mouth 3 (three) times daily., Disp: 90 tablet, Rfl: 0 .  hydroxypropyl methylcellulose / hypromellose (ISOPTO TEARS / GONIOVISC) 2.5 % ophthalmic solution, Place 1 drop into both eyes 4 (four) times daily as needed for dry eyes., Disp: , Rfl:  .  ketoconazole (NIZORAL) 2 % cream, Apply 1 application topically 2 (two) times daily. (Patient taking differently: Apply 1 application topically 2 (two) times daily as needed for irritation. ), Disp: 30 g, Rfl: 0 .  labetalol (NORMODYNE) 100 MG tablet, take 2 tablets by mouth twice a day (Patient taking differently: Take 200 mg by mouth 2 (two) times daily. take 2 tablets by mouth twice a day), Disp: 120 tablet, Rfl: 0 .  linezolid (ZYVOX) 600 MG tablet, Take 1 tablet (600 mg total) by mouth 2 (two) times daily., Disp: 10 tablet, Rfl: 0 .  metFORMIN (GLUCOPHAGE) 500 MG tablet, take 1 tablet by mouth once daily for diabetes (Patient taking differently: Take 500 mg by mouth daily with breakfast. ), Disp: 90 tablet, Rfl: 1 .  Multiple Vitamins-Minerals (ALIVE ONCE DAILY WOMENS PO), Take 15 mLs by mouth every other day. , Disp: , Rfl:  .  predniSONE (DELTASONE) 20 MG tablet, Take 1 tablet (20 mg total) by mouth daily with breakfast., Disp: 14 tablet, Rfl: 0 .  traMADol (ULTRAM) 50 MG tablet, Take 1-2 tablets every 6 hours as needed for pain (Patient taking differently: Take 100 mg by mouth 2 (two) times daily. MORNING AND BEDTIME), Disp: 240 tablet, Rfl: 5 .  triamcinolone cream (KENALOG) 0.1 %, apply to affected area twice a day if needed for IRRITATION (Patient taking differently: Apply to affected area twice a day if needed for IRRITATION), Disp: 45 g, Rfl: 2 .  vitamin C (ASCORBIC ACID) 500 MG tablet, Take 500 mg by mouth every other day. , Disp: , Rfl:   Allergies  Allergen Reactions  . Clonidine Derivatives Swelling    [name]  . Fish Allergy  Diarrhea, Swelling and Other (See Comments)    Turns skin "black," but can tolerate white fish Salmon- Diarrhea  . Shellfish Allergy Hives  . Doxycycline Rash  . Indomethacin     Reaction not recalled by the patient  . Lyrica [Pregabalin]     Hallucinations   . Methyldopa     Aldomet (for hypertension): Reaction not recalled by the patient  . Orange Fruit [Citrus] Other (See Comments)    Indigestion/heartburn  . Cetirizine Hcl Itching and Rash  . Codeine Itching  . Levaquin [Levofloxacin In D5w] Rash  . Tomato Rash   Review of Systems   Pertinent items are noted in  the HPI. Otherwise, ROS is negative.  Vitals:   Vitals:   06/08/17 1416  BP: 140/82  Pulse: 93  Temp: 98.3 F (36.8 C)  TempSrc: Oral  SpO2: 98%  Weight: 183 lb 3.2 oz (83.1 kg)  Height: 5\' 4"  (1.626 m)     Body mass index is 31.45 kg/m.  Physical Exam:   Physical Exam  Constitutional: She is oriented to person, place, and time. She appears well-developed and well-nourished. No distress.  HENT:  Head: Normocephalic and atraumatic.  Eyes: Pupils are equal, round, and reactive to light. EOM are normal.  Neck: Normal range of motion. Neck supple.  Cardiovascular: Normal rate, regular rhythm and intact distal pulses.   Pulmonary/Chest: Effort normal.  Abdominal: Soft.  Neurological: She is alert and oriented to person, place, and time.  Psychiatric: She has a normal mood and affect. Her behavior is normal.  Nursing note and vitals reviewed.    Right Lower Extremity  Results for orders placed or performed during the hospital encounter of 06/05/17  CBC with Differential  Result Value Ref Range   WBC 12.3 (H) 4.0 - 10.5 K/uL   RBC 3.29 (L) 3.87 - 5.11 MIL/uL   Hemoglobin 10.3 (L) 12.0 - 15.0 g/dL   HCT 06/07/17 (L) 03.0 - 09.2 %   MCV 92.7 78.0 - 100.0 fL   MCH 31.3 26.0 - 34.0 pg   MCHC 33.8 30.0 - 36.0 g/dL   RDW 33.0 (H) 07.6 - 22.6 %   Platelets 313 150 - 400 K/uL   Neutrophils Relative % 67 %     Neutro Abs 8.2 (H) 1.7 - 7.7 K/uL   Lymphocytes Relative 24 %   Lymphs Abs 3.0 0.7 - 4.0 K/uL   Monocytes Relative 8 %   Monocytes Absolute 1.0 0.1 - 1.0 K/uL   Eosinophils Relative 1 %   Eosinophils Absolute 0.1 0.0 - 0.7 K/uL   Basophils Relative 0 %   Basophils Absolute 0.0 0.0 - 0.1 K/uL  Basic metabolic panel  Result Value Ref Range   Sodium 137 135 - 145 mmol/L   Potassium 3.7 3.5 - 5.1 mmol/L   Chloride 104 101 - 111 mmol/L   CO2 23 22 - 32 mmol/L   Glucose, Bld 98 65 - 99 mg/dL   BUN 29 (H) 6 - 20 mg/dL   Creatinine, Ser 33.3 (H) 0.44 - 1.00 mg/dL   Calcium 9.2 8.9 - 5.45 mg/dL   GFR calc non Af Amer 47 (L) >60 mL/min   GFR calc Af Amer 54 (L) >60 mL/min   Anion gap 10 5 - 15   Assessment and Plan:   Rudell was seen today for follow-up.  Diagnoses and all orders for this visit:  Wound of right lower extremity, subsequent encounter Comments: Chronic wounds that began as RA nodules. First culture + for e.coli. Antibiotics tried - Doxycycline. Went to [facility] - given Bactrim. No improvement. Hospitalized and given IV Abx on 05/25/17. Will also provide those records. No vascular studies completed. Lesions still purulent. Unable to restart RA medications until they are improving. On chronic prednisone at this time.  Orders: -     CBC with Differential/Platelet -     Comprehensive metabolic panel -     Ferritin -     Ambulatory referral to Wound Clinic  Anemia of chronic disease -     Ferritin   . Reviewed expectations re: course of current medical issues. . Discussed self-management of symptoms. . Outlined signs  and symptoms indicating need for more acute intervention. . Patient verbalized understanding and all questions were answered. Marland Kitchen Health Maintenance issues including appropriate healthy diet, exercise, and smoking avoidance were discussed with patient. . See orders for this visit as documented in the electronic medical record. . Patient received an After Visit  Summary.  CMA served as Neurosurgeon during this visit. History, Physical, and Plan performed by medical provider. The above documentation has been reviewed and is accurate and complete. Helane Rima, D.O.  Helane Rima, DO [place], [place] 06/10/2017  Future Appointments Date Time Provider Department Center  06/28/2017 4:15 PM Comer, Belia Heman, MD [facility]  07/21/2017 1:45 PM Pollyann Savoy, MD [facility] None  07/29/2017 1:45 PM Charlett Nose, DPM [facility]  09/13/2017 1:00 PM Helane Rima, DO [facility] None

## 2017-06-08 NOTE — Telephone Encounter (Signed)
Please advise 

## 2017-06-08 NOTE — Telephone Encounter (Signed)
Para March called in to advise that the patient went to the [facility] ed on 06/05/17. The RA dr advised that some bloodwork needs to be done on the sores she has on her legs. She describes that the sores are getting worse by the day. She states that the referral they have for the wound center (scheduled for 06/21/2017) is too far away. She is asking if it is at all possible for [place] to help expedite this referral. We did not initiate it, but Para March feels as though parties involved have been dropping the ball. She is asking for our help at this point.   I have scheduled for 2:00PM O/V   Verified mobile #

## 2017-06-08 NOTE — Telephone Encounter (Signed)
Wound Care specialist is recommending vascular consult, secura protective ointment, hydrofera blue (antimicrobial).   Please advise.  Thank you,  -LL

## 2017-06-09 ENCOUNTER — Telehealth: Payer: Self-pay | Admitting: Family Medicine

## 2017-06-09 LAB — COMPREHENSIVE METABOLIC PANEL
ALT: 14 U/L (ref 0–35)
AST: 18 U/L (ref 0–37)
Albumin: 4 g/dL (ref 3.5–5.2)
Alkaline Phosphatase: 48 U/L (ref 39–117)
BUN: 31 mg/dL — ABNORMAL HIGH (ref 6–23)
CO2: 24 mEq/L (ref 19–32)
Calcium: 9.6 mg/dL (ref 8.4–10.5)
Chloride: 101 mEq/L (ref 96–112)
Creatinine, Ser: 1.11 mg/dL (ref 0.40–1.20)
GFR: 60.05 mL/min (ref >60.00)
Glucose, Bld: 130 mg/dL — ABNORMAL HIGH (ref 70–99)
Potassium: 4.5 mEq/L (ref 3.5–5.1)
Sodium: 135 mEq/L (ref 135–145)
Total Bilirubin: 0.5 mg/dL (ref 0.2–1.2)
Total Protein: 7.1 g/dL (ref 6.0–8.3)

## 2017-06-09 LAB — CBC WITH DIFFERENTIAL/PLATELET
Basophils Absolute: 0 10*3/uL (ref 0.0–0.1)
Basophils Relative: 0.3 % (ref 0.0–3.0)
Eosinophils Absolute: 0 10*3/uL (ref 0.0–0.7)
Eosinophils Relative: 0.2 % (ref 0.0–5.0)
HCT: 34.5 % — ABNORMAL LOW (ref 36.0–46.0)
Hemoglobin: 11.1 g/dL — ABNORMAL LOW (ref 12.0–15.0)
Lymphocytes Relative: 12.7 % (ref 12.0–46.0)
Lymphs Abs: 1.8 10*3/uL (ref 0.7–4.0)
MCHC: 32.2 g/dL (ref 30.0–36.0)
MCV: 97.3 fl (ref 78.0–100.0)
Monocytes Absolute: 0.4 10*3/uL (ref 0.1–1.0)
Monocytes Relative: 2.5 % — ABNORMAL LOW (ref 3.0–12.0)
Neutro Abs: 12.1 10*3/uL — ABNORMAL HIGH (ref 1.4–7.7)
Neutrophils Relative %: 84.3 % — ABNORMAL HIGH (ref 43.0–77.0)
Platelets: 336 10*3/uL (ref 150.0–400.0)
RBC: 3.55 Mil/uL — ABNORMAL LOW (ref 3.87–5.11)
RDW: 17.5 % — ABNORMAL HIGH (ref 11.5–15.5)
WBC: 14.4 10*3/uL — ABNORMAL HIGH (ref 4.0–10.5)

## 2017-06-09 LAB — FERRITIN: Ferritin: 136.6 ng/mL (ref 10.0–291.0)

## 2017-06-09 NOTE — Telephone Encounter (Signed)
Paperwork: Alternative Program Paperwork   Paperwork received by [employer] requesting form]:  Daughter Lattie Corns [phone]  Individual made aware of 3-5 business day turn around (Y/N): Yes  Office form(s) completed and placed with paperwork (Y/N):   Form location:  In Dr. Earlene Plater folder in a white envelope in front office.

## 2017-06-11 NOTE — Telephone Encounter (Signed)
Amber has on her desk.

## 2017-06-11 NOTE — Telephone Encounter (Signed)
Daughter, Lattie Corns, walked in to pick ppw and also explained the referral placed for her mom to go to Vascular Surgery was incorrect.  I advised Lattie Corns that the referral was not incorrect, that their office does wound care in addition to Vascular services.   She said that her sister took their mom to the Vascular office today and that office just referred them to the Wound Care center where she was originally supposed to be seen on Aug 13th. Essentially they went to the Vascular office and nothing was done to help in the area of wound care.  Lattie Corns is very unhappy about this and I've made Georgette Shell and Juanda Crumble aware.  Please advise.  Ty,  -LL

## 2017-06-11 NOTE — Telephone Encounter (Signed)
Paperwork filled out and placed at front desk for pick up.  [name] notified and will come pick up.

## 2017-06-11 NOTE — Telephone Encounter (Signed)
Spoke to patient's daughter, [name] [name]. Daughter states Vascular did see the patient and "cleared her" for vascular but stated they did not assess wounds because they do not manage wounds. Again, interacttion provided by patient's daughter. Attempted to contact Vascular at [place] and they are closed until Monday.   Met with [name] [name] about concern and how to move forward until Dr. Juleen China arrives back on Monday. [name] [name] stated to review systemic symptoms if wound becomes more infected or red at site over the weekend to go to ED and that we will follow-up with plan for wound care on Monday. Daughter is aware and verbally understood.

## 2017-06-12 NOTE — Telephone Encounter (Signed)
This is completely my fault. I thought that they also did wound care. I was mistaken. We did need vascular evaluation, so it was not a wasted visit, but I completely understand the frustration. Let's make this a priority Monday morning.

## 2017-06-14 ENCOUNTER — Telehealth: Payer: Self-pay | Admitting: Rheumatology

## 2017-06-14 NOTE — Telephone Encounter (Signed)
I called patient, referral cancelled.

## 2017-06-14 NOTE — Telephone Encounter (Signed)
Patient calling to find out what was discussed about her hip with patients daughter. Patient has had no problems with hip at all. Please call patient to advise.

## 2017-06-15 ENCOUNTER — Telehealth: Payer: Self-pay | Admitting: Rheumatology

## 2017-06-15 ENCOUNTER — Telehealth: Payer: Self-pay | Admitting: Family Medicine

## 2017-06-15 NOTE — Telephone Encounter (Signed)
[name] called in reference to mother's appointment with the wound center on August 13th. [name] mother stated [name] [name] was going to try and get her in sooner but wounds have "started to heal and looking better" so patient's mother stated leaving the appointment on the 13th would be fine. Please call patient's mother and advise. She would still like to speak with someone on this issue. OK to leave message.

## 2017-06-15 NOTE — Telephone Encounter (Signed)
[name] calling to get more information about hip injection appt. Patient is very confused, and daughter would like clarification.

## 2017-06-15 NOTE — Telephone Encounter (Signed)
I called daughter, Lattie Corns, regarding order for hip injection. Order cancelled.

## 2017-06-15 NOTE — Telephone Encounter (Signed)
Spoke with patients daughter and she is fine with keeping the appointment on the 13th.

## 2017-06-17 NOTE — Telephone Encounter (Signed)
Patient is scheduled for an appointment with wound care on 06/25/17.  Spoke with wound care and they do not have an earlier appointment available.  Will place patient on wait list and call if there is a cancellation.

## 2017-06-21 ENCOUNTER — Encounter (HOSPITAL_BASED_OUTPATIENT_CLINIC_OR_DEPARTMENT_OTHER): Payer: Medicare Other

## 2017-06-21 ENCOUNTER — Other Ambulatory Visit: Payer: Self-pay | Admitting: Family Medicine

## 2017-06-21 NOTE — Telephone Encounter (Signed)
Please advise 

## 2017-06-23 ENCOUNTER — Ambulatory Visit (INDEPENDENT_AMBULATORY_CARE_PROVIDER_SITE_OTHER): Payer: Medicare Other | Admitting: Physical Medicine and Rehabilitation

## 2017-06-25 ENCOUNTER — Encounter (HOSPITAL_BASED_OUTPATIENT_CLINIC_OR_DEPARTMENT_OTHER): Payer: Medicare Other | Attending: Surgery

## 2017-06-25 DIAGNOSIS — I130 Hypertensive heart and chronic kidney disease with heart failure and stage 1 through stage 4 chronic kidney disease, or unspecified chronic kidney disease: Secondary | ICD-10-CM | POA: Diagnosis not present

## 2017-06-25 DIAGNOSIS — E1122 Type 2 diabetes mellitus with diabetic chronic kidney disease: Secondary | ICD-10-CM | POA: Diagnosis not present

## 2017-06-25 DIAGNOSIS — Z7952 Long term (current) use of systemic steroids: Secondary | ICD-10-CM | POA: Insufficient documentation

## 2017-06-25 DIAGNOSIS — M0689 Other specified rheumatoid arthritis, multiple sites: Secondary | ICD-10-CM | POA: Diagnosis not present

## 2017-06-25 DIAGNOSIS — N183 Chronic kidney disease, stage 3 (moderate): Secondary | ICD-10-CM | POA: Insufficient documentation

## 2017-06-25 DIAGNOSIS — L97212 Non-pressure chronic ulcer of right calf with fat layer exposed: Secondary | ICD-10-CM | POA: Insufficient documentation

## 2017-06-25 DIAGNOSIS — I2510 Atherosclerotic heart disease of native coronary artery without angina pectoris: Secondary | ICD-10-CM | POA: Insufficient documentation

## 2017-06-25 DIAGNOSIS — Z7984 Long term (current) use of oral hypoglycemic drugs: Secondary | ICD-10-CM | POA: Insufficient documentation

## 2017-06-25 DIAGNOSIS — Z7982 Long term (current) use of aspirin: Secondary | ICD-10-CM | POA: Diagnosis not present

## 2017-06-25 DIAGNOSIS — Z8673 Personal history of transient ischemic attack (TIA), and cerebral infarction without residual deficits: Secondary | ICD-10-CM | POA: Diagnosis not present

## 2017-06-25 DIAGNOSIS — Z79899 Other long term (current) drug therapy: Secondary | ICD-10-CM | POA: Insufficient documentation

## 2017-06-25 DIAGNOSIS — I5032 Chronic diastolic (congestive) heart failure: Secondary | ICD-10-CM | POA: Diagnosis not present

## 2017-06-28 ENCOUNTER — Ambulatory Visit (INDEPENDENT_AMBULATORY_CARE_PROVIDER_SITE_OTHER): Payer: Medicare Other | Admitting: Internal Medicine

## 2017-06-28 ENCOUNTER — Encounter: Payer: Self-pay | Admitting: Internal Medicine

## 2017-06-28 DIAGNOSIS — M0630 Rheumatoid nodule, unspecified site: Secondary | ICD-10-CM

## 2017-06-28 DIAGNOSIS — R229 Localized swelling, mass and lump, unspecified: Secondary | ICD-10-CM

## 2017-06-28 DIAGNOSIS — M069 Rheumatoid arthritis, unspecified: Secondary | ICD-10-CM

## 2017-06-28 HISTORY — DX: Rheumatoid nodule, unspecified site: M06.30

## 2017-06-28 NOTE — Progress Notes (Signed)
   Subjective:    Patient ID: OLINDA NOLA, female    DOB: Aug 25, 1930, 83 y.o.   MRN: [id number]  HPI Here for follow up of leg lesions.   I saw her in the hospital where she was admitted with concern for recurrent infection.  Her lesions on her right leg though was chronic and she had been on multiple courses of antibiotics with no improvement of the ulcer and had a biopsy done by dermatology.  The biopsy at the time was not known but since has come back more c/w rheumatoid nodules.  It did not appear infected and I had her get a short course of linezolid due to possible minimal superinfection.  Since discharge she has been going to wound care.  She has not yet restarted her biologics for RA and is followed by Dr. Corliss Skains.     Review of Systems  Constitutional: Negative for fever.  Gastrointestinal: Negative for diarrhea.  Skin: Negative for rash.       Objective:   Physical Exam  Constitutional: She appears well-developed and well-nourished. No distress.  Eyes: No scleral icterus.  Musculoskeletal: She exhibits no edema.  Skin:  Two ulcers on right leg open, no surrounding erythema, no drainage. One other developing nodule          Assessment & Plan:

## 2017-06-28 NOTE — Assessment & Plan Note (Signed)
Most c/w this based on history, methotrexate use and biopsy from July.  No role for antibiotics and no concerns for infection.   rtc PRN

## 2017-06-28 NOTE — Assessment & Plan Note (Signed)
To discuss treatment options with Dr. Corliss Skains

## 2017-06-30 ENCOUNTER — Ambulatory Visit: Payer: Medicare Other | Admitting: *Deleted

## 2017-06-30 VITALS — BP 110/63 | HR 89

## 2017-06-30 DIAGNOSIS — M0579 Rheumatoid arthritis with rheumatoid factor of multiple sites without organ or systems involvement: Secondary | ICD-10-CM

## 2017-06-30 MED ORDER — TOCILIZUMAB 162 MG/0.9ML ~~LOC~~ SOSY
162.0000 mg | PREFILLED_SYRINGE | Freq: Once | SUBCUTANEOUS | Status: AC
  Administered 2017-06-30: 162 mg via SUBCUTANEOUS

## 2017-06-30 NOTE — Progress Notes (Addendum)
Patient in office for a new start to Actemra. Patient has been cleared by Infectious Disease to start medication. Patient has been instructed on how to inject the use the medication. Patient has been instructed on lab schedule, labs in 1 month then every 2 months. Patient was provided with a sample. Patient given Actemra in left lower abdomen and tolerated injection well. Patient monitored in office for 30 minutes after administration for adverse reactions. No adverse reaction noted.   Administrations This Visit    Tocilizumab SOSY 162 mg    Admin Date 06/30/2017 Action Given Dose 162 mg Route Subcutaneous Administered By Henriette Combs, LPN          Patient had clearance from the ID physician to restart her DMARD's and Biologics. She came in today with her daughter to receive her first Actemra injection. He already have consent on file. Side effects were reviewed. She was given her first Actemra injection. She was observed in the office for 30 minutes and tolerated the medication well without any adverse reaction. We will check her labs in a month then every 2 months to monitor for drug toxicity. I will check her lipid panel in 2 months and then every 6 months. I've also advised her to resume her methotrexate to 3 tablets by mouth every week as she was doing previously. She is a still on prednisone 50 mg by mouth daily. We will try to taper prednisone by 2.5 mg every 2 weeks if tolerated. The detailed written instructions were given to the patient and patient's daughter. She was advised to keep the follow-up appointment.  Pollyann Savoy, MD

## 2017-06-30 NOTE — Patient Instructions (Addendum)
Standing Labs We placed an order today for your standing lab work.    Please come back and get your standing labs in 1 month and then every 2 months. You should have a lipid panel drawn in 2 months then every 6 months.  We have open lab Monday through Friday from 8:30-11:30 AM and 1:30-4 PM at the office of Dr. Pollyann Savoy.   The office is located at [address], [address], [place], [place] [address] No appointment is necessary.   Labs are drawn by [facility].  You may receive a bill from [facility] for your lab work. If you have any questions regarding directions or hours of operation,  please call [phone].    When you start feeling better please start tapering your prednisone as follows:  Prednisone 10 mg for 2 weeks Prednisone 7.5 mg for 2 weeks Prednisone 5 mg for 2 weeks  Prednisone 2.5 mg for 2 weeks  Then stop.

## 2017-07-05 ENCOUNTER — Other Ambulatory Visit: Payer: Self-pay | Admitting: Family Medicine

## 2017-07-05 ENCOUNTER — Other Ambulatory Visit: Payer: Self-pay

## 2017-07-05 ENCOUNTER — Telehealth: Payer: Self-pay | Admitting: *Deleted

## 2017-07-05 LAB — CBC AND DIFFERENTIAL
HCT: 31 — AB (ref 36–46)
Hemoglobin: 10.9 — AB (ref 12.0–16.0)
Neutrophils Absolute: 6
Platelets: 278 (ref 150–399)
WBC: 7.4

## 2017-07-05 LAB — BASIC METABOLIC PANEL
BUN: 31 — AB (ref 4–21)
Creatinine: 0.9 (ref 0.5–1.1)
Glucose: 143
Potassium: 4.1 (ref 3.4–5.3)
Sodium: 140 (ref 137–147)

## 2017-07-05 LAB — HEPATIC FUNCTION PANEL
ALT: 15 (ref 7–35)
AST: 20 (ref 13–35)
Alkaline Phosphatase: 56 (ref 25–125)
Bilirubin, Total: 0.2

## 2017-07-05 MED ORDER — HYPROMELLOSE (GONIOSCOPIC) 2.5 % OP SOLN: 1.0000 [drp] | Freq: Four times a day (QID) | OPHTHALMIC | 2 refills | Status: DC | PRN

## 2017-07-05 NOTE — Telephone Encounter (Signed)
Refill sent.

## 2017-07-05 NOTE — Telephone Encounter (Signed)
Patient complaints of unable to move arm nor stand without help. Medications recently changed.   Patient advised to go to ED but did not. -- Please follow up with patient and determine if patient needs to see Rheumatologist or Primary Care for symptoms.     -------------------------------------------------------------------------------------------------------- PLEASE NOTE: All timestamps contained within this report are represented as [place] Standard Time. CONFIDENTIALTY NOTICE: This fax transmission is intended only for the addressee. It contains information that is legally privileged, confidential or otherwise protected from use or disclosure. If you are not the intended recipient, you are strictly prohibited from reviewing, disclosing, copying using or disseminating any of this information or taking any action in reliance on or regarding this information. If you have received this fax in error, please notify us immediately by telephone so that we can arrange for its return to [place]. Phone: [phone], Toll-Free: [phone], Fax: [phone] Page: 1 of 1 Call Id: [id number] [facility] - [place] TELEPHONE ADVICE RECORD [facility] Patient Name: [name] [name] Gender: Female DOB: [birth year] Age: 83 Y 2 M 30 D Return Phone Number: [phone] (Primary), [phone] (Secondary) City/State/Zip: [place] [address] Client [facility] - Human resources officer [facility] Physician Helane Rima- DO Who Is Calling Patient / Member / Family / Caregiver Call Type Triage / Clinical Caller Name [name] Relationship To Patient Daughter Return Phone Number [phone] (Primary) Chief Complaint Weakness, Generalized Reason for Call Symptomatic / Request for Health Information Initial Comment Caller states her mother has rheumatoid arthritis and today she is weak and she is  unable to use her arm. Her blood sugar is up to 161 Nurse Assessment Nurse: Katrina Stack, RN, Dahlia Client Date/Time (Eastern Time): 07/03/2017 1:49:20 PM Confirm and document reason for call. If symptomatic, describe symptoms. ---Caller states her mom has RA and is not able to use her arm. Her medications were changed recently. Prednisone and Methotrexate are the medication. Unable to stand without help. Left arm is worse. having to drag her feet. Does the PT have any chronic conditions? (i.e. diabetes, asthma, etc.) ---Yes List chronic conditions. ---RA, diabetes, HTN, Guidelines Guideline Title Affirmed Question Weakness (Generalized) and Fatigue [1] SEVERE weakness (i.e., unable to walk or barely able to walk, requires support) AND [2] new onset or worsening Disp. Time Lamount Cohen Time) Disposition Final User 07/03/2017 2:04:07 PM Call EMS 911 Now Yes Katrina Stack, RN, Dahlia Client Referrals [facility] - ED GO TO FACILITY REFUSED Care Advice Given Per Guideline CALL EMS 911 NOW: Immediate medical attention is needed. You need to hang up and call 911 (or an ambulance). Probation officer Discretion: I'll call you back in a few minutes to be sure you were able to reach them.) CARE ADVICE given per Weakness and Fatigue (Adult) guideline.

## 2017-07-05 NOTE — Telephone Encounter (Signed)
Called and spoke with patient, she states that she is " feeling pretty good this morning and is not experiencing any pain at the moment." she said she is currently able to use her left arm and the swollen went down but right hand has some swollen but that is not a new onset and she has had that problem for some time. Patient states that she is going to the Kidney doctor today and doesn't feel she needs to be seen at this time. She states that if something comes up and the symptoms reoccur she will contact the office and schedule an appointment. Nothing further needed at this time.

## 2017-07-05 NOTE — Telephone Encounter (Signed)
MEDICATION: hydroxypropyl methylcellulose / hypromellose (ISOPTO TEARS / GONIOVISC) 2.5 % ophthalmic solution (BP Medication)  PHARMACY:  [facility] - [place], [place] - [address] [phone] (Phone) [phone] (Fax)   IS THIS A 90 DAY SUPPLY : yes  IS PATIENT OUT OF MEDICATION: yes  IF NOT; HOW MUCH IS LEFT: n/a  LAST APPOINTMENT DATE:06/08/17  NEXT APPOINTMENT DATE:09/13/17  OTHER COMMENTS: [name] requesting a call to discuss further also if possible.   **Let patient know to contact pharmacy at the end of the day to make sure medication is ready. **  ** Please notify patient to allow 48-72 hours to process**  **Encourage patient to contact the pharmacy for refills or they can request refills through [facility]**

## 2017-07-06 NOTE — Telephone Encounter (Signed)
[facility] is being difficult for the patient, changing pharmacies to the one below:  MEDICATION:   hydroxypropyl methylcellulose / hypromellose (ISOPTO TEARS / GONIOVISC) 2.5 % ophthalmic solution   PHARMACY:  [id number] - [place], [place] - [address] AT [facility] OF [facility] [address] [phone] (Phone) [phone] (Fax)   IS THIS A 90 DAY SUPPLY : yes  IS PATIENT OUT OF MEDICATION: yes  IF NOT; HOW MUCH IS LEFT:n/a   LAST APPOINTMENT DATE:06/08/17  NEXT APPOINTMENT DATE:09/13/17  OTHER COMMENTS: Please resend to the pharmacy listed above, the other pharmacy was being difficult for the patient.   **Let patient know to contact pharmacy at the end of the day to make sure medication is ready. **  ** Please notify patient to allow 48-72 hours to process**  **Encourage patient to contact the pharmacy for refills or they can request refills through [facility]**

## 2017-07-07 ENCOUNTER — Other Ambulatory Visit: Payer: Self-pay | Admitting: Family Medicine

## 2017-07-07 ENCOUNTER — Other Ambulatory Visit: Payer: Self-pay

## 2017-07-07 DIAGNOSIS — I10 Essential (primary) hypertension: Secondary | ICD-10-CM

## 2017-07-07 MED ORDER — HYPROMELLOSE (GONIOSCOPIC) 2.5 % OP SOLN: 1.0000 [drp] | Freq: Four times a day (QID) | OPHTHALMIC | 2 refills | Status: DC | PRN

## 2017-07-07 NOTE — Telephone Encounter (Signed)
Rx has been sent to pharmacy

## 2017-07-08 NOTE — Telephone Encounter (Signed)
MEDICATION: hydrALAZINE (APRESOLINE) 100 MG tablet BLOOD PRESSURE MEDICATION  PHARMACY: [facility] - [place], [place] - [address]    IS THIS A 90 DAY SUPPLY : yes  IS PATIENT OUT OF MEDICATION: yes  IF NOT; HOW MUCH IS LEFT: n/a  LAST APPOINTMENT DATE:06/08/17  NEXT APPOINTMENT DATE:09/13/17  OTHER COMMENTS: Incorrect medication was sent in originally   **Let patient know to contact pharmacy at the end of the day to make sure medication is ready. **  ** Please notify patient to allow 48-72 hours to process**  **Encourage patient to contact the pharmacy for refills or they can request refills through [facility]**

## 2017-07-08 NOTE — Telephone Encounter (Signed)
Please advise on refill.

## 2017-07-08 NOTE — Telephone Encounter (Signed)
Dr. Earlene Plater has already sent medication to pharmacy.

## 2017-07-14 ENCOUNTER — Encounter: Payer: Self-pay | Admitting: Family Medicine

## 2017-07-14 LAB — IRON AND TIBC
Iron Saturation: 17
Iron: 50
TIBC: 295
UIBC: 245

## 2017-07-14 LAB — FERRITIN, SERUM (SERIAL): Ferritin: 164

## 2017-07-14 LAB — ESTIMATED GFR: EGFR (African American): 66

## 2017-07-14 NOTE — Progress Notes (Signed)
Office Visit Note  Patient: [name] [name]             Date of Birth: Aug 14, 1930           MRN: [id number]             PCP: Helane Rima, DO Referring: Helane Rima, DO Visit Date: 07/21/2017 Occupation: @GUAROCC @    Subjective:  Joint stiffness.   History of Present Illness: [name] [name] is a 83 y.o. female  With history of seronegative rheumatoid arthritis. She was started on Actemra subcutaneous on August 22. She also added methotrexate 4 tablets by mouth every week due to frequent flares. She complains of some morning stiffness and discomfort which she's not having any joint swelling. She tapered off Prednisone. [name] who accompanied her states that she [facility] for the right lower extremity lesions. Her lesions were debrided and dressed.  Activities of Daily Living:  Patient reports morning stiffness for 1 hour.   Patient Reports nocturnal pain.  Difficulty dressing/grooming: Reports Difficulty climbing stairs: Reports Difficulty getting out of chair: Reports Difficulty using hands for taps, buttons, cutlery, and/or writing: Reports   Review of Systems  Constitutional: Positive for weakness. Negative for fatigue, night sweats, weight gain and weight loss.  HENT: Positive for mouth dryness. Negative for mouth sores, trouble swallowing, trouble swallowing and nose dryness.   Eyes: Positive for dryness. Negative for pain, redness and visual disturbance.  Respiratory: Negative.  Negative for cough, shortness of breath and difficulty breathing.   Cardiovascular: Positive for hypertension and swelling in legs/feet. Negative for chest pain, palpitations and irregular heartbeat.  Gastrointestinal: Negative.  Negative for blood in stool, constipation and diarrhea.  Endocrine: Negative for increased urination.  Genitourinary: Negative for vaginal dryness.  Musculoskeletal: Positive for arthralgias, joint pain, joint swelling, muscle  weakness, morning stiffness and muscle tenderness. Negative for myalgias and myalgias.  Skin: Negative.  Negative for color change, rash, hair loss, skin tightness, ulcers and sensitivity to sunlight.  Allergic/Immunologic: Negative for susceptible to infections.  Neurological: Negative for dizziness, numbness, headaches, memory loss and night sweats.  Hematological: Negative for swollen glands.  Psychiatric/Behavioral: Negative.  Negative for depressed mood and sleep disturbance. The patient is not nervous/anxious.     PMFS History:  Patient Active Problem List   Diagnosis Date Noted  . Rheumatic nodule 06/28/2017  . History of CHF (congestive heart failure) 02/18/2017  . History of diabetes mellitus 02/18/2017  . History of total knee arthroplasty, bilateral 02/18/2017  . History of rotator cuff surgery 02/18/2017  . Obesity (BMI 30.0-34.9) 12/08/2016  . High risk medications (not anticoagulants) long-term use 09/21/2016  . Hammer toe 10/31/2015  . Acute on chronic diastolic heart failure (HCC) 09/12/2015  . Onychomycosis 04/24/2015  . Midline low back pain without sciatica 06/27/2014  . Bilateral edema of lower extremity 06/05/2014  . CKD (chronic kidney disease) stage 3, GFR 30-59 ml/min 06/05/2014  . DM (diabetes mellitus), type 2 with renal complications (HCC) 06/05/2014    Class: Chronic  . Anemia of chronic disease 05/01/2014  . Fluctuating blood pressure 02/06/2014  . Stroke (HCC) 01/17/2014  . Rheumatoid arthritis (HCC) 12/19/2013    Class: Chronic  . CAD (coronary artery disease) 10/18/2013  . HTN (hypertension) 08/08/2013    Class: Chronic  . Fibromyalgia 05/10/2013  . Reactive airway disease 04/19/2013  . Gout 04/19/2013  . Hyperlipidemia 04/27/2007  . Allergic rhinitis 04/27/2007  . Diverticulosis 04/27/2007    Past  Medical History:  Diagnosis Date  . Allergic rhinitis due to pollen 04/27/2007  . Arthritis    "in q joint" (08/07/2013)  . Coronary  atherosclerosis of native coronary artery   . Cough   . Disorder of bone and cartilage, unspecified   . Edema 05/03/2013  . Exertional shortness of breath    "sometimes" (08/07/2013)  . First degree atrioventricular block   . Gout, unspecified 04/19/2013  . Hyperlipidemia 04/27/2007  . Hypertension   . Muscle spasm   . Muscle weakness (generalized)   . Myocardial infarction (HCC) ~ 1970  . Onychia and paronychia of toe   . Osteoarthrosis, unspecified whether generalized or localized, unspecified site 04/27/2007  . Other fall   . Other malaise and fatigue   . Prepatellar bursitis   . Seizures (HCC)   . Stroke (HCC) 01/17/2014  . TIA (transient ischemic attack)    "a few one summer" (08/07/2013)  . Type II diabetes mellitus (HCC)    "fasting 90-110s" (08/07/2013)  . Unspecified essential hypertension 08/08/2013  . Unspecified vitamin D deficiency     Family History  Problem Relation Age of Onset  . Heart attack Father    Past Surgical History:  Procedure Laterality Date  . ABDOMINAL HYSTERECTOMY  04/1980  . APPENDECTOMY  1960  . CARDIAC CATHETERIZATION  2003  . CATARACT EXTRACTION W/ INTRAOCULAR LENS  IMPLANT, BILATERAL Bilateral ~ 2012  . JOINT REPLACEMENT    . REPLACEMENT TOTAL KNEE Right 09/2005  . SHOULDER OPEN ROTATOR CUFF REPAIR Right 07/1999  . TONSILLECTOMY  08/1974  . TOTAL KNEE ARTHROPLASTY Left 08/07/2013   Procedure: TOTAL KNEE ARTHROPLASTY- LEFT;  Surgeon: Dannielle Huh, MD;  Location: [facility] OR;  Service: Orthopedics;  Laterality: Left;  . TRANSTHORACIC ECHOCARDIOGRAM  2003   EF 55-65%; mild concentric LVH   Social History   Social History Narrative   Widowed   Research officer, political party with cane     Objective: Vital Signs: Ht 5\' 4"  (1.626 m)   Wt 183 lb (83 kg)   BMI 31.41 kg/m    Physical Exam  Constitutional: She is oriented to person, place, and time. She appears well-developed and well-nourished.  HENT:  Head: Normocephalic and atraumatic.  Eyes: Conjunctivae and EOM  are normal.  Neck: Normal range of motion.  Cardiovascular: Normal rate, regular rhythm, normal heart sounds and intact distal pulses.   Pulmonary/Chest: Effort normal and breath sounds normal.  Abdominal: Soft. Bowel sounds are normal.  Lymphadenopathy:    She has no cervical adenopathy.  Neurological: She is alert and oriented to person, place, and time.  Skin: Skin is warm and dry. Capillary refill takes less than 2 seconds. Rash noted.  She has lesions on right lower extremities which are wrapped in bandaged  Psychiatric: She has a normal mood and affect. Her behavior is normal.  Nursing note and vitals reviewed.    Musculoskeletal Exam: Patient has limited range of motion of her cervical and lumbar spine. Shoulder joints painful limited range of motion. She has synovitis over bilateral wrist joints bilateral MCPs and PIP joints as described below. She also had some swelling in her left knee joint. Her bilateral knee joints are replaced.  CDAI Exam: CDAI Homunculus Exam:   Tenderness:  RUE: wrist LUE: wrist Right hand: 2nd MCP Left hand: 2nd MCP and 3rd PIP LLE: tibiofemoral  Swelling:  RUE: wrist LUE: wrist Right hand: 2nd MCP Left hand: 2nd MCP and 3rd PIP LLE: tibiofemoral  Joint Counts:  CDAI Tender Joint  count: 6 CDAI Swollen Joint count: 6  Global Assessments:  Patient Global Assessment: 6 Provider Global Assessment: 6  CDAI Calculated Score: 24    Investigation: No additional findings. CBC Latest Ref Rng & Units 07/05/2017 06/08/2017 06/05/2017  WBC - 7.4 14.4(H) 12.3(H)  Hemoglobin 12.0 - 16.0 10.9(A) 11.1(L) 10.3(L)  Hematocrit 36 - 46 31(A) 34.5(L) 30.5(L)  Platelets 150 - 399 278 336.0 313   CMP Latest Ref Rng & Units 07/05/2017 06/08/2017 06/05/2017  Glucose 70 - 99 mg/dL - 774(F) 98  BUN 4 - 21 31(A) 31(H) 29(H)  Creatinine 0.5 - 1.1 0.9 1.11 1.06(H)  Sodium 137 - 147 140 135 137  Potassium 3.4 - 5.3 4.1 4.5 3.7  Chloride 96 - 112 mEq/L - 101 104    CO2 19 - 32 mEq/L - 24 23  Calcium 8.4 - 10.5 mg/dL - 9.6 9.2  Total Protein 6.0 - 8.3 g/dL - 7.1 -  Total Bilirubin 0.2 - 1.2 mg/dL - 0.5 -  Alkaline Phos 25 - 125 56 48 -  AST 13 - 35 20 18 -  ALT 7 - 35 15 14 -    Imaging: No results found.  Speciality Comments: No specialty comments available.    Procedures:  No procedures performed Allergies: Clonidine derivatives; Fish allergy; Shellfish allergy; Doxycycline; Indomethacin; Lyrica [pregabalin]; Methyldopa; Orange fruit [citrus]; Cetirizine hcl; Codeine; Levaquin [levofloxacin in d5w]; and Tomato   Assessment / Plan:     Visit Diagnoses: Rheumatoid arthritis of multiple sites with negative rheumatoid factor (HCC) - Has had multiple rounds of prednisone taper since April 2018. She was started on Actemra in August 2018. She believes that Actemra is working the best so far. She came off the prednisone taper this month. She states the swelling has come back off prednisone and would like to stay on low-dose prednisone. I will give her prescription for 5 mg of prednisone as she is going out of town with her daughter. Hopefully as Actemra gets into her system she should be able to taper off prednisone.  High risk medication use - Actemra sq q qwk , methotrexate 4 tablets po qwk, Folic acid 1 mg po qd (inadequate response to Humira, Orencia sq, xeljanz) - Plan: CBC with Differential/Platelet, CBC with Differential/Platelet, COMPLETE METABOLIC PANEL WITH GFR, COMPLETE METABOLIC PANEL WITH GFR today and then every 3 months to monitor for drug toxicity.  Stage 3 chronic kidney disease - GFR 30-59 ml/min  Idiopathic chronic gout of multiple sites without tophus - allopurinol 100 mg by mouth daily. She's not had any gout flare.  Fibromyalgia: She continues to have some generalized pain and discomfort.  History of CHF (congestive heart failure): Her symptoms got worse on anti-TNF's in the past.  History of diverticulosis  History of  hyperlipidemia  History of diabetes mellitus: She will have to monitor her blood sugars closely while on prednisone.  History of total knee arthroplasty, bilateral  Immunosuppression (HCC) - Plan: Quantiferon tb gold assay (blood)    Orders: Orders Placed This Encounter  Procedures  . CBC with Differential/Platelet  . CBC with Differential/Platelet  . COMPLETE METABOLIC PANEL WITH GFR  . COMPLETE METABOLIC PANEL WITH GFR  . Quantiferon tb gold assay (blood)   Meds ordered this encounter  Medications  . DISCONTD: predniSONE (DELTASONE) 20 MG tablet    Sig: Take 0.5 tablets (10 mg total) by mouth daily with breakfast.    Dispense:  30 tablet    Refill:  0  . predniSONE (  DELTASONE) 5 MG tablet    Sig: Take 1 tablet (5 mg total) by mouth daily with breakfast.    Dispense:  30 tablet    Refill:  0    Face-to-face time spent with patient was 30 minutes. Greater than 50% of time was spent in counseling and coordination of care.  Follow-Up Instructions: Return in about 3 months (around 10/20/2017) for Rheumatoid arthritis.   Pollyann Savoy, MD  Note - This record has been created using Animal nutritionist.  Chart creation errors have been sought, but may not always  have been located. Such creation errors do not reflect on  the standard of medical care.

## 2017-07-19 ENCOUNTER — Encounter (HOSPITAL_BASED_OUTPATIENT_CLINIC_OR_DEPARTMENT_OTHER): Payer: Medicare Other | Attending: Internal Medicine

## 2017-07-19 ENCOUNTER — Other Ambulatory Visit: Payer: Self-pay | Admitting: Family Medicine

## 2017-07-19 DIAGNOSIS — E1122 Type 2 diabetes mellitus with diabetic chronic kidney disease: Secondary | ICD-10-CM | POA: Diagnosis not present

## 2017-07-19 DIAGNOSIS — I129 Hypertensive chronic kidney disease with stage 1 through stage 4 chronic kidney disease, or unspecified chronic kidney disease: Secondary | ICD-10-CM | POA: Diagnosis not present

## 2017-07-19 DIAGNOSIS — N183 Chronic kidney disease, stage 3 (moderate): Secondary | ICD-10-CM | POA: Insufficient documentation

## 2017-07-19 DIAGNOSIS — M0689 Other specified rheumatoid arthritis, multiple sites: Secondary | ICD-10-CM | POA: Diagnosis not present

## 2017-07-19 DIAGNOSIS — I2510 Atherosclerotic heart disease of native coronary artery without angina pectoris: Secondary | ICD-10-CM | POA: Diagnosis not present

## 2017-07-19 DIAGNOSIS — L97512 Non-pressure chronic ulcer of other part of right foot with fat layer exposed: Secondary | ICD-10-CM | POA: Insufficient documentation

## 2017-07-21 ENCOUNTER — Encounter: Payer: Self-pay | Admitting: Rheumatology

## 2017-07-21 ENCOUNTER — Ambulatory Visit (INDEPENDENT_AMBULATORY_CARE_PROVIDER_SITE_OTHER): Payer: Medicare Other | Admitting: Rheumatology

## 2017-07-21 VITALS — Ht 64.0 in | Wt 183.0 lb

## 2017-07-21 DIAGNOSIS — Z79899 Other long term (current) drug therapy: Secondary | ICD-10-CM

## 2017-07-21 DIAGNOSIS — Z8679 Personal history of other diseases of the circulatory system: Secondary | ICD-10-CM | POA: Diagnosis not present

## 2017-07-21 DIAGNOSIS — Z8639 Personal history of other endocrine, nutritional and metabolic disease: Secondary | ICD-10-CM | POA: Diagnosis not present

## 2017-07-21 DIAGNOSIS — M0609 Rheumatoid arthritis without rheumatoid factor, multiple sites: Secondary | ICD-10-CM | POA: Diagnosis not present

## 2017-07-21 DIAGNOSIS — M797 Fibromyalgia: Secondary | ICD-10-CM

## 2017-07-21 DIAGNOSIS — N183 Chronic kidney disease, stage 3 unspecified: Secondary | ICD-10-CM

## 2017-07-21 DIAGNOSIS — D899 Disorder involving the immune mechanism, unspecified: Secondary | ICD-10-CM

## 2017-07-21 DIAGNOSIS — Z8719 Personal history of other diseases of the digestive system: Secondary | ICD-10-CM | POA: Diagnosis not present

## 2017-07-21 DIAGNOSIS — Z96653 Presence of artificial knee joint, bilateral: Secondary | ICD-10-CM | POA: Diagnosis not present

## 2017-07-21 DIAGNOSIS — M1A09X0 Idiopathic chronic gout, multiple sites, without tophus (tophi): Secondary | ICD-10-CM

## 2017-07-21 DIAGNOSIS — D849 Immunodeficiency, unspecified: Secondary | ICD-10-CM

## 2017-07-21 DIAGNOSIS — I2510 Atherosclerotic heart disease of native coronary artery without angina pectoris: Secondary | ICD-10-CM

## 2017-07-21 MED ORDER — PREDNISONE 5 MG PO TABS: 5.0000 mg | ORAL_TABLET | Freq: Every day | ORAL | 0 refills | Status: DC

## 2017-07-21 MED ORDER — PREDNISONE 20 MG PO TABS: 5.0000 mg | ORAL_TABLET | Freq: Every day | ORAL | 0 refills | Status: DC

## 2017-07-21 NOTE — Patient Instructions (Signed)
Standing Labs We placed an order today for your standing lab work.    Please come back and get your standing labs in November and every 3 months  We have open lab Monday through Friday from 8:30-11:30 AM and 1:30-4 PM at the office of Dr. Mayo Faulk.   The office is located at [address], [address], [place], [place] [address] No appointment is necessary.   Labs are drawn by [facility].  You may receive a bill from [facility] for your lab work. If you have any questions regarding directions or hours of operation,  please call [phone].    

## 2017-07-22 LAB — CBC WITH DIFFERENTIAL/PLATELET
Basophils Absolute: 32 cells/uL (ref 0–200)
Basophils Relative: 0.7 %
Eosinophils Absolute: 131 cells/uL (ref 15–500)
Eosinophils Relative: 2.9 %
HCT: 33.2 % — ABNORMAL LOW (ref 35.0–45.0)
Hemoglobin: 11 g/dL — ABNORMAL LOW (ref 11.7–15.5)
Lymphs Abs: 1490 cells/uL (ref 850–3900)
MCH: 30.8 pg (ref 27.0–33.0)
MCHC: 33.1 g/dL (ref 32.0–36.0)
MCV: 93 fL (ref 80.0–100.0)
MPV: 9.3 fL (ref 7.5–12.5)
Monocytes Relative: 9.6 %
Neutro Abs: 2417 cells/uL (ref 1500–7800)
Neutrophils Relative %: 53.7 %
Platelets: 265 10*3/uL (ref 140–400)
RBC: 3.57 10*6/uL — ABNORMAL LOW (ref 3.80–5.10)
RDW: 16.7 % — ABNORMAL HIGH (ref 11.0–15.0)
Total Lymphocyte: 33.1 %
WBC mixed population: 432 cells/uL (ref 200–950)
WBC: 4.5 10*3/uL (ref 3.8–10.8)

## 2017-07-22 LAB — COMPLETE METABOLIC PANEL WITH GFR
AG Ratio: 1.3 (calc) (ref 1.0–2.5)
ALT: 12 U/L (ref 6–29)
AST: 22 U/L (ref 10–35)
Albumin: 3.7 g/dL (ref 3.6–5.1)
Alkaline phosphatase (APISO): 43 U/L (ref 33–130)
BUN/Creatinine Ratio: 20 (calc) (ref 6–22)
BUN: 24 mg/dL (ref 7–25)
CO2: 26 mmol/L (ref 20–32)
Calcium: 9.3 mg/dL (ref 8.6–10.4)
Chloride: 104 mmol/L (ref 98–110)
Creat: 1.19 mg/dL — ABNORMAL HIGH (ref 0.60–0.88)
GFR, Est African American: 48 mL/min/{1.73_m2} — ABNORMAL LOW (ref >=60)
GFR, Est Non African American: 42 mL/min/{1.73_m2} — ABNORMAL LOW (ref >=60)
Globulin: 2.9 g/dL (calc) (ref 1.9–3.7)
Glucose, Bld: 96 mg/dL (ref 65–99)
Potassium: 4 mmol/L (ref 3.5–5.3)
Sodium: 138 mmol/L (ref 135–146)
Total Bilirubin: 0.3 mg/dL (ref 0.2–1.2)
Total Protein: 6.6 g/dL (ref 6.1–8.1)

## 2017-07-22 NOTE — Progress Notes (Signed)
stable °

## 2017-07-23 LAB — QUANTIFERON TB GOLD ASSAY (BLOOD)
Mitogen-Nil: >10 IU/mL
QUANTIFERON(R)-TB GOLD: NEGATIVE
Quantiferon Nil Value: 0.08 IU/mL
Quantiferon Tb Ag Minus Nil Value: <0 IU/mL

## 2017-07-26 ENCOUNTER — Telehealth: Payer: Self-pay | Admitting: Rheumatology

## 2017-07-26 MED FILL — ACTEMRA 162 MG/0.9 ML SYRN: 162 | 28 days supply | Qty: 2 | Fill #1

## 2017-07-26 NOTE — Telephone Encounter (Signed)
Patients daughter calling in reference to injection patient received here in the office. Patient is due for next injection this Wednesday. Does patient come back to office for this injection? Please call to advise.

## 2017-07-26 NOTE — Telephone Encounter (Signed)
Spoke with Para March and advised that she is to give the injection at home. Para March states she does not have any more pens at home to give. Provided Para March with the number to [facility] outpatient pharmacy as she should have 2 refills on file. Para March will contact the pharmacy.

## 2017-07-27 DIAGNOSIS — L97512 Non-pressure chronic ulcer of other part of right foot with fat layer exposed: Secondary | ICD-10-CM | POA: Diagnosis not present

## 2017-07-29 ENCOUNTER — Ambulatory Visit (INDEPENDENT_AMBULATORY_CARE_PROVIDER_SITE_OTHER): Payer: Medicare Other | Admitting: Podiatry

## 2017-07-29 ENCOUNTER — Telehealth: Payer: Self-pay | Admitting: Family Medicine

## 2017-07-29 ENCOUNTER — Encounter: Payer: Self-pay | Admitting: Podiatry

## 2017-07-29 DIAGNOSIS — M79672 Pain in left foot: Secondary | ICD-10-CM

## 2017-07-29 DIAGNOSIS — E1169 Type 2 diabetes mellitus with other specified complication: Secondary | ICD-10-CM

## 2017-07-29 DIAGNOSIS — B351 Tinea unguium: Secondary | ICD-10-CM | POA: Diagnosis not present

## 2017-07-29 DIAGNOSIS — M79671 Pain in right foot: Secondary | ICD-10-CM | POA: Diagnosis not present

## 2017-07-29 DIAGNOSIS — Z794 Long term (current) use of insulin: Secondary | ICD-10-CM

## 2017-07-29 NOTE — Telephone Encounter (Signed)
[name] one touch glucose monitor broke.  Please advise,  Ty, -LL

## 2017-07-29 NOTE — Progress Notes (Signed)
SUBJECTIVE: 83 y.o. year old female presents requesting toe nails trimmed. She was accompanied by her daughter and walks with cane. Stated that her blood sugar is under control.  HPIL Rheumatoid arthritis has been affecting her for the past 4-5 years and fingers and toes are affected.  OBJECTIVE: DERMATOLOGIC EXAMINATION: Thick dystrophic and hypertrophic nails x 10. Recent history of skin biopsy right lower limb.  VASCULAR EXAMINATION OF LOWER LIMBS: All pedal pulses are palpable with normal pulsation.  No edema or erythema noted.  NEUROLOGIC EXAMINATION OF THE LOWER LIMBS: All epicritic and tactile sensations grossly intact.  MUSCULOSKELETAL EXAMINATION: Severe contracted 3rd digits at PIPJ and DIPJ curling into sulcus areabilateral painful with weight bearing.  Assessment: Onychomycosis x 10.  Hammer toe deformity 3rd bilateral pain with ambulation.  PLAN: All nails debrided. Return in 3 months.

## 2017-07-29 NOTE — Patient Instructions (Signed)
Seen for hypertrophic nails. All nails debrided. Return in 3 months or as needed.  

## 2017-07-30 MED ORDER — BLOOD GLUCOSE MONITOR KIT: PACK | 0 refills | Status: DC

## 2017-07-30 NOTE — Telephone Encounter (Signed)
New glucometer sent to pharmacy. 

## 2017-08-02 ENCOUNTER — Telehealth: Payer: Self-pay | Admitting: Rheumatology

## 2017-08-02 NOTE — Telephone Encounter (Signed)
Attempted to contact [facility] and left message for her to call the office.

## 2017-08-02 NOTE — Telephone Encounter (Signed)
Para March called stating that [name] [name] took her Actemra on 07/28/17 and then took her MTX on 07/29/17. After taking both the Actemra and the MTX she has been really weak and unable to get out of the bed. Patient has not had any energy, fatigued and sleeping a lot. Para March is concerned with her taking the injection with the MTX. Please adivse.

## 2017-08-02 NOTE — Telephone Encounter (Signed)
She may stop methotrexate and see if her symptoms improve.

## 2017-08-02 NOTE — Telephone Encounter (Signed)
Para March advised to have [name] [name] to discontinue MTX and see if symptoms improve. She verbalized understanding.

## 2017-08-02 NOTE — Telephone Encounter (Signed)
[name] Para March called concerned about her mother because after taking the Actemra injection last Wednesday, she has been weak and has "not been herself." This was her second injection, per daughter and she also took MTX on Thursday. Please call patient's daughter.

## 2017-08-03 DIAGNOSIS — L97512 Non-pressure chronic ulcer of other part of right foot with fat layer exposed: Secondary | ICD-10-CM | POA: Diagnosis not present

## 2017-08-05 ENCOUNTER — Other Ambulatory Visit: Payer: Self-pay | Admitting: Family Medicine

## 2017-08-05 DIAGNOSIS — I10 Essential (primary) hypertension: Secondary | ICD-10-CM

## 2017-08-09 ENCOUNTER — Telehealth: Payer: Self-pay

## 2017-08-09 NOTE — Telephone Encounter (Signed)
Received a letter from [facility]) that patient received a temporary supply of Actemra on 07/26/2017. A prior authorization will be required for further coverage.   Will send documents to scan center.  A prior authorization was submitted to pts insurance [facility]) via cover my meds. Received a confirmation that the medication has been approved through 11/08/2018.   Reference number: [id number] Phone number: [phone]  Called patient to inform. She voiced understanding and denied any questions at this time.  [name] [name] 9:54 AM

## 2017-08-10 ENCOUNTER — Encounter (HOSPITAL_BASED_OUTPATIENT_CLINIC_OR_DEPARTMENT_OTHER): Payer: Medicare Other | Attending: Surgery

## 2017-08-10 DIAGNOSIS — L97212 Non-pressure chronic ulcer of right calf with fat layer exposed: Secondary | ICD-10-CM | POA: Insufficient documentation

## 2017-08-10 DIAGNOSIS — L97812 Non-pressure chronic ulcer of other part of right lower leg with fat layer exposed: Secondary | ICD-10-CM | POA: Insufficient documentation

## 2017-08-10 DIAGNOSIS — I2510 Atherosclerotic heart disease of native coronary artery without angina pectoris: Secondary | ICD-10-CM | POA: Insufficient documentation

## 2017-08-10 DIAGNOSIS — M0689 Other specified rheumatoid arthritis, multiple sites: Secondary | ICD-10-CM | POA: Insufficient documentation

## 2017-08-10 DIAGNOSIS — I5032 Chronic diastolic (congestive) heart failure: Secondary | ICD-10-CM | POA: Insufficient documentation

## 2017-08-10 DIAGNOSIS — E1122 Type 2 diabetes mellitus with diabetic chronic kidney disease: Secondary | ICD-10-CM | POA: Insufficient documentation

## 2017-08-10 DIAGNOSIS — Z7952 Long term (current) use of systemic steroids: Secondary | ICD-10-CM | POA: Insufficient documentation

## 2017-08-10 DIAGNOSIS — I130 Hypertensive heart and chronic kidney disease with heart failure and stage 1 through stage 4 chronic kidney disease, or unspecified chronic kidney disease: Secondary | ICD-10-CM | POA: Insufficient documentation

## 2017-08-10 DIAGNOSIS — N183 Chronic kidney disease, stage 3 (moderate): Secondary | ICD-10-CM | POA: Insufficient documentation

## 2017-08-11 DIAGNOSIS — E1122 Type 2 diabetes mellitus with diabetic chronic kidney disease: Secondary | ICD-10-CM | POA: Diagnosis not present

## 2017-08-11 DIAGNOSIS — N183 Chronic kidney disease, stage 3 (moderate): Secondary | ICD-10-CM | POA: Diagnosis not present

## 2017-08-11 DIAGNOSIS — I5032 Chronic diastolic (congestive) heart failure: Secondary | ICD-10-CM | POA: Diagnosis not present

## 2017-08-11 DIAGNOSIS — M069 Rheumatoid arthritis, unspecified: Secondary | ICD-10-CM | POA: Diagnosis not present

## 2017-08-11 DIAGNOSIS — I2510 Atherosclerotic heart disease of native coronary artery without angina pectoris: Secondary | ICD-10-CM | POA: Diagnosis not present

## 2017-08-11 DIAGNOSIS — I130 Hypertensive heart and chronic kidney disease with heart failure and stage 1 through stage 4 chronic kidney disease, or unspecified chronic kidney disease: Secondary | ICD-10-CM | POA: Diagnosis not present

## 2017-08-11 DIAGNOSIS — Z7952 Long term (current) use of systemic steroids: Secondary | ICD-10-CM | POA: Diagnosis not present

## 2017-08-11 DIAGNOSIS — M0689 Other specified rheumatoid arthritis, multiple sites: Secondary | ICD-10-CM | POA: Diagnosis not present

## 2017-08-11 DIAGNOSIS — M359 Systemic involvement of connective tissue, unspecified: Secondary | ICD-10-CM | POA: Diagnosis not present

## 2017-08-11 DIAGNOSIS — L97812 Non-pressure chronic ulcer of other part of right lower leg with fat layer exposed: Secondary | ICD-10-CM | POA: Diagnosis not present

## 2017-08-11 DIAGNOSIS — L97212 Non-pressure chronic ulcer of right calf with fat layer exposed: Secondary | ICD-10-CM | POA: Diagnosis not present

## 2017-08-13 ENCOUNTER — Other Ambulatory Visit: Payer: Self-pay | Admitting: Family Medicine

## 2017-08-13 DIAGNOSIS — I10 Essential (primary) hypertension: Secondary | ICD-10-CM

## 2017-08-16 ENCOUNTER — Telehealth: Payer: Self-pay | Admitting: Rheumatology

## 2017-08-16 DIAGNOSIS — M797 Fibromyalgia: Secondary | ICD-10-CM

## 2017-08-16 DIAGNOSIS — M1A09X0 Idiopathic chronic gout, multiple sites, without tophus (tophi): Secondary | ICD-10-CM

## 2017-08-16 DIAGNOSIS — M0579 Rheumatoid arthritis with rheumatoid factor of multiple sites without organ or systems involvement: Secondary | ICD-10-CM

## 2017-08-16 NOTE — Telephone Encounter (Signed)
Patients daughter calling due to MTX. Patient is having a lot of pain, and swelling. Daughter increased dose of Prednisone yesterday, and wants to know if can continue with extra dose. Also, wants to know about starting on MTX again. Patient doing worse since she started injection. Please call to discuss.

## 2017-08-17 ENCOUNTER — Other Ambulatory Visit: Payer: Self-pay | Admitting: Rheumatology

## 2017-08-17 DIAGNOSIS — M109 Gout, unspecified: Secondary | ICD-10-CM | POA: Diagnosis not present

## 2017-08-17 DIAGNOSIS — L97319 Non-pressure chronic ulcer of right ankle with unspecified severity: Secondary | ICD-10-CM | POA: Diagnosis not present

## 2017-08-17 DIAGNOSIS — N183 Chronic kidney disease, stage 3 (moderate): Secondary | ICD-10-CM | POA: Diagnosis not present

## 2017-08-17 DIAGNOSIS — Z7984 Long term (current) use of oral hypoglycemic drugs: Secondary | ICD-10-CM | POA: Diagnosis not present

## 2017-08-17 DIAGNOSIS — I129 Hypertensive chronic kidney disease with stage 1 through stage 4 chronic kidney disease, or unspecified chronic kidney disease: Secondary | ICD-10-CM | POA: Diagnosis not present

## 2017-08-17 DIAGNOSIS — E1122 Type 2 diabetes mellitus with diabetic chronic kidney disease: Secondary | ICD-10-CM | POA: Diagnosis not present

## 2017-08-17 DIAGNOSIS — Z7952 Long term (current) use of systemic steroids: Secondary | ICD-10-CM | POA: Diagnosis not present

## 2017-08-17 DIAGNOSIS — M1990 Unspecified osteoarthritis, unspecified site: Secondary | ICD-10-CM | POA: Diagnosis not present

## 2017-08-17 DIAGNOSIS — Z8673 Personal history of transient ischemic attack (TIA), and cerebral infarction without residual deficits: Secondary | ICD-10-CM | POA: Diagnosis not present

## 2017-08-17 DIAGNOSIS — L97219 Non-pressure chronic ulcer of right calf with unspecified severity: Secondary | ICD-10-CM | POA: Diagnosis not present

## 2017-08-17 DIAGNOSIS — I2510 Atherosclerotic heart disease of native coronary artery without angina pectoris: Secondary | ICD-10-CM | POA: Diagnosis not present

## 2017-08-17 DIAGNOSIS — Z96653 Presence of artificial knee joint, bilateral: Secondary | ICD-10-CM | POA: Diagnosis not present

## 2017-08-17 DIAGNOSIS — I252 Old myocardial infarction: Secondary | ICD-10-CM | POA: Diagnosis not present

## 2017-08-17 DIAGNOSIS — Z7982 Long term (current) use of aspirin: Secondary | ICD-10-CM | POA: Diagnosis not present

## 2017-08-17 DIAGNOSIS — R569 Unspecified convulsions: Secondary | ICD-10-CM | POA: Diagnosis not present

## 2017-08-17 DIAGNOSIS — L97819 Non-pressure chronic ulcer of other part of right lower leg with unspecified severity: Secondary | ICD-10-CM | POA: Diagnosis not present

## 2017-08-17 DIAGNOSIS — E11622 Type 2 diabetes mellitus with other skin ulcer: Secondary | ICD-10-CM | POA: Diagnosis not present

## 2017-08-17 NOTE — Telephone Encounter (Signed)
I had a detailed conversation with Mrs. Vonbehren. Her daughter Para March was not available to talk to. I tried to explain to Mrs. Murrill that the swelling she is experiencing is not coming from Actemra but it is because of poor control of her rheumatoid arthritis. She states that she would like to go back on methotrexate and was not sure why we stopped her methotrexate. I reviewed the records and found that Para March, her daughter was concerned that methotrexate was causing increased fatigue. I advised patient to go back on methotrexate at this time and can try a combination of methotrexate and Actemra. I also offered that we can refer her to [facility] or she can find another rheumatologist in town to look at other treatment options. Please make a referral to [facility] so that she would not have any delay in her treatment. If the family wants to see a local rheumatologist in town they can get a referral from her PCP. So far I'm unable to control her rheumatoid arthritis with different treatment options. Patient stated that her daughter will call us back.

## 2017-08-17 NOTE — Telephone Encounter (Signed)
Para March states that after the Actemra injections patient is having increased weakness and swelling.Patient is currently on Prednisone 5 mg daily. Para March states she increased Ms. Connery dose to 10 mg. Para March would like to know if we should Actemra and go back on just MTX. Para March states Ms. Heninger did better on the MTX. She is also requesting a refill on the prednisone. Please advise.

## 2017-08-17 NOTE — Telephone Encounter (Signed)
[name] called today, leaving a message on voicemail for a call back. Per daughter they are at a critical stage, and need a call back to discuss.

## 2017-08-18 DIAGNOSIS — E1122 Type 2 diabetes mellitus with diabetic chronic kidney disease: Secondary | ICD-10-CM | POA: Diagnosis not present

## 2017-08-18 DIAGNOSIS — L97812 Non-pressure chronic ulcer of other part of right lower leg with fat layer exposed: Secondary | ICD-10-CM | POA: Diagnosis not present

## 2017-08-18 DIAGNOSIS — M0689 Other specified rheumatoid arthritis, multiple sites: Secondary | ICD-10-CM | POA: Diagnosis not present

## 2017-08-18 DIAGNOSIS — I130 Hypertensive heart and chronic kidney disease with heart failure and stage 1 through stage 4 chronic kidney disease, or unspecified chronic kidney disease: Secondary | ICD-10-CM | POA: Diagnosis not present

## 2017-08-18 DIAGNOSIS — Z7952 Long term (current) use of systemic steroids: Secondary | ICD-10-CM | POA: Diagnosis not present

## 2017-08-18 DIAGNOSIS — I5032 Chronic diastolic (congestive) heart failure: Secondary | ICD-10-CM | POA: Diagnosis not present

## 2017-08-18 DIAGNOSIS — I2510 Atherosclerotic heart disease of native coronary artery without angina pectoris: Secondary | ICD-10-CM | POA: Diagnosis not present

## 2017-08-18 DIAGNOSIS — L97212 Non-pressure chronic ulcer of right calf with fat layer exposed: Secondary | ICD-10-CM | POA: Diagnosis not present

## 2017-08-18 DIAGNOSIS — N183 Chronic kidney disease, stage 3 (moderate): Secondary | ICD-10-CM | POA: Diagnosis not present

## 2017-08-18 MED FILL — predniSONE 5 MG TABS: 5 | 30 days supply | Qty: 30 | Fill #0

## 2017-08-18 MED FILL — ACTEMRA 162 MG/0.9 ML SYRN: 162 | 28 days supply | Qty: 2 | Fill #2

## 2017-08-18 NOTE — Telephone Encounter (Signed)
ok 

## 2017-08-18 NOTE — Telephone Encounter (Signed)
Last Visit: 07/21/17 Next Visit: 10/21/17  Do you want patient to continue on Prednisone daily?

## 2017-08-19 DIAGNOSIS — E1122 Type 2 diabetes mellitus with diabetic chronic kidney disease: Secondary | ICD-10-CM | POA: Diagnosis not present

## 2017-08-19 DIAGNOSIS — N183 Chronic kidney disease, stage 3 (moderate): Secondary | ICD-10-CM | POA: Diagnosis not present

## 2017-08-19 DIAGNOSIS — R569 Unspecified convulsions: Secondary | ICD-10-CM | POA: Diagnosis not present

## 2017-08-19 DIAGNOSIS — M1990 Unspecified osteoarthritis, unspecified site: Secondary | ICD-10-CM | POA: Diagnosis not present

## 2017-08-19 DIAGNOSIS — I252 Old myocardial infarction: Secondary | ICD-10-CM | POA: Diagnosis not present

## 2017-08-19 DIAGNOSIS — Z7984 Long term (current) use of oral hypoglycemic drugs: Secondary | ICD-10-CM | POA: Diagnosis not present

## 2017-08-19 DIAGNOSIS — M109 Gout, unspecified: Secondary | ICD-10-CM | POA: Diagnosis not present

## 2017-08-19 DIAGNOSIS — Z8673 Personal history of transient ischemic attack (TIA), and cerebral infarction without residual deficits: Secondary | ICD-10-CM | POA: Diagnosis not present

## 2017-08-19 DIAGNOSIS — L97319 Non-pressure chronic ulcer of right ankle with unspecified severity: Secondary | ICD-10-CM | POA: Diagnosis not present

## 2017-08-19 DIAGNOSIS — I129 Hypertensive chronic kidney disease with stage 1 through stage 4 chronic kidney disease, or unspecified chronic kidney disease: Secondary | ICD-10-CM | POA: Diagnosis not present

## 2017-08-19 DIAGNOSIS — L97219 Non-pressure chronic ulcer of right calf with unspecified severity: Secondary | ICD-10-CM | POA: Diagnosis not present

## 2017-08-19 DIAGNOSIS — I2510 Atherosclerotic heart disease of native coronary artery without angina pectoris: Secondary | ICD-10-CM | POA: Diagnosis not present

## 2017-08-19 DIAGNOSIS — E11622 Type 2 diabetes mellitus with other skin ulcer: Secondary | ICD-10-CM | POA: Diagnosis not present

## 2017-08-19 DIAGNOSIS — Z7952 Long term (current) use of systemic steroids: Secondary | ICD-10-CM | POA: Diagnosis not present

## 2017-08-19 DIAGNOSIS — Z96653 Presence of artificial knee joint, bilateral: Secondary | ICD-10-CM | POA: Diagnosis not present

## 2017-08-19 DIAGNOSIS — L97819 Non-pressure chronic ulcer of other part of right lower leg with unspecified severity: Secondary | ICD-10-CM | POA: Diagnosis not present

## 2017-08-19 DIAGNOSIS — Z7982 Long term (current) use of aspirin: Secondary | ICD-10-CM | POA: Diagnosis not present

## 2017-08-23 ENCOUNTER — Telehealth: Payer: Self-pay | Admitting: Rheumatology

## 2017-08-23 ENCOUNTER — Telehealth: Payer: Self-pay | Admitting: Family Medicine

## 2017-08-23 NOTE — Telephone Encounter (Signed)
[name] called in reference to medications for patient from rheumatoid doctor (specifically Predisone). [name] has concerns about these medications and side effects of these medications. [name] would like advice on a possible referral to another rheumatoid doctor. Patients daughter stated all the medications patient is taking are not good for her and she would like to know other options. Please call patients daughter and advise. OK to leave message.

## 2017-08-23 NOTE — Telephone Encounter (Signed)
LM for daughter to return call

## 2017-08-23 NOTE — Telephone Encounter (Signed)
[name] called to follow up on referral to [facility]. She is requesting this be an urgent referral due to some issues the patient experienced over the weekend.

## 2017-08-23 NOTE — Telephone Encounter (Signed)
[facility] is a good option. Update them.

## 2017-08-23 NOTE — Telephone Encounter (Signed)
Attempted to contact patient's daughter.  She answered and then disconnected the call.  Will try to call again.  Spoke with patient.  She states she is doing as well as she can.  Joints are stiff today, but she attributes that to the weather and her RA.

## 2017-08-23 NOTE — Telephone Encounter (Signed)
Please advise.  Per chart, patient's rheumatologist is already referring her to [facility] for her RA.  There is a telephone message in her chart today regarding this.

## 2017-08-24 DIAGNOSIS — I252 Old myocardial infarction: Secondary | ICD-10-CM | POA: Diagnosis not present

## 2017-08-24 DIAGNOSIS — R569 Unspecified convulsions: Secondary | ICD-10-CM | POA: Diagnosis not present

## 2017-08-24 DIAGNOSIS — Z7982 Long term (current) use of aspirin: Secondary | ICD-10-CM | POA: Diagnosis not present

## 2017-08-24 DIAGNOSIS — R6510 Systemic inflammatory response syndrome (SIRS) of non-infectious origin without acute organ dysfunction: Secondary | ICD-10-CM | POA: Diagnosis not present

## 2017-08-24 DIAGNOSIS — N183 Chronic kidney disease, stage 3 (moderate): Secondary | ICD-10-CM | POA: Diagnosis not present

## 2017-08-24 DIAGNOSIS — Z7952 Long term (current) use of systemic steroids: Secondary | ICD-10-CM | POA: Diagnosis not present

## 2017-08-24 DIAGNOSIS — Z7984 Long term (current) use of oral hypoglycemic drugs: Secondary | ICD-10-CM | POA: Diagnosis not present

## 2017-08-24 DIAGNOSIS — L97819 Non-pressure chronic ulcer of other part of right lower leg with unspecified severity: Secondary | ICD-10-CM | POA: Diagnosis not present

## 2017-08-24 DIAGNOSIS — M109 Gout, unspecified: Secondary | ICD-10-CM | POA: Diagnosis not present

## 2017-08-24 DIAGNOSIS — E1122 Type 2 diabetes mellitus with diabetic chronic kidney disease: Secondary | ICD-10-CM | POA: Diagnosis not present

## 2017-08-24 DIAGNOSIS — L97319 Non-pressure chronic ulcer of right ankle with unspecified severity: Secondary | ICD-10-CM | POA: Diagnosis not present

## 2017-08-24 DIAGNOSIS — L97219 Non-pressure chronic ulcer of right calf with unspecified severity: Secondary | ICD-10-CM | POA: Diagnosis not present

## 2017-08-24 DIAGNOSIS — E11622 Type 2 diabetes mellitus with other skin ulcer: Secondary | ICD-10-CM | POA: Diagnosis not present

## 2017-08-24 DIAGNOSIS — M1990 Unspecified osteoarthritis, unspecified site: Secondary | ICD-10-CM | POA: Diagnosis not present

## 2017-08-24 DIAGNOSIS — I130 Hypertensive heart and chronic kidney disease with heart failure and stage 1 through stage 4 chronic kidney disease, or unspecified chronic kidney disease: Secondary | ICD-10-CM | POA: Diagnosis not present

## 2017-08-24 DIAGNOSIS — Z8673 Personal history of transient ischemic attack (TIA), and cerebral infarction without residual deficits: Secondary | ICD-10-CM | POA: Diagnosis not present

## 2017-08-24 DIAGNOSIS — M0689 Other specified rheumatoid arthritis, multiple sites: Secondary | ICD-10-CM | POA: Diagnosis not present

## 2017-08-24 DIAGNOSIS — I5023 Acute on chronic systolic (congestive) heart failure: Secondary | ICD-10-CM | POA: Diagnosis not present

## 2017-08-24 DIAGNOSIS — I129 Hypertensive chronic kidney disease with stage 1 through stage 4 chronic kidney disease, or unspecified chronic kidney disease: Secondary | ICD-10-CM | POA: Diagnosis not present

## 2017-08-24 DIAGNOSIS — I2510 Atherosclerotic heart disease of native coronary artery without angina pectoris: Secondary | ICD-10-CM | POA: Diagnosis not present

## 2017-08-24 DIAGNOSIS — Z96653 Presence of artificial knee joint, bilateral: Secondary | ICD-10-CM | POA: Diagnosis not present

## 2017-08-24 NOTE — Telephone Encounter (Signed)
[name] returning phone call. I advised her of the call back times between 1p-2p, she would like a call back before then if possible. [name] is also aware that Dr. Earlene Plater is out of the office today. Call to advise.

## 2017-08-24 NOTE — Telephone Encounter (Signed)
I spoke with patients daughter and advised her of Dr Earlene Plater message. I have also explained that the Rheumatology office has already placed the referral and they are waiting for a response from [facility]. Patients daughter verbalized gratitude for the information.

## 2017-08-24 NOTE — Telephone Encounter (Signed)
Left message on machine for Verlon Au, daughter, referral, notes and labs faxed urgent referral, pending appt.

## 2017-08-25 ENCOUNTER — Telehealth: Payer: Self-pay

## 2017-08-25 DIAGNOSIS — M85871 Other specified disorders of bone density and structure, right ankle and foot: Secondary | ICD-10-CM | POA: Diagnosis not present

## 2017-08-25 DIAGNOSIS — M25871 Other specified joint disorders, right ankle and foot: Secondary | ICD-10-CM | POA: Diagnosis not present

## 2017-08-25 DIAGNOSIS — Z79899 Other long term (current) drug therapy: Secondary | ICD-10-CM | POA: Insufficient documentation

## 2017-08-25 DIAGNOSIS — S92021A Displaced fracture of anterior process of right calcaneus, initial encounter for closed fracture: Secondary | ICD-10-CM | POA: Diagnosis not present

## 2017-08-25 DIAGNOSIS — M1A09X1 Idiopathic chronic gout, multiple sites, with tophus (tophi): Secondary | ICD-10-CM | POA: Diagnosis not present

## 2017-08-25 DIAGNOSIS — M25872 Other specified joint disorders, left ankle and foot: Secondary | ICD-10-CM | POA: Diagnosis not present

## 2017-08-25 DIAGNOSIS — M150 Primary generalized (osteo)arthritis: Secondary | ICD-10-CM | POA: Diagnosis not present

## 2017-08-25 DIAGNOSIS — M205X1 Other deformities of toe(s) (acquired), right foot: Secondary | ICD-10-CM | POA: Diagnosis not present

## 2017-08-25 DIAGNOSIS — M205X2 Other deformities of toe(s) (acquired), left foot: Secondary | ICD-10-CM | POA: Diagnosis not present

## 2017-08-25 DIAGNOSIS — M069 Rheumatoid arthritis, unspecified: Secondary | ICD-10-CM | POA: Diagnosis not present

## 2017-08-25 DIAGNOSIS — M85872 Other specified disorders of bone density and structure, left ankle and foot: Secondary | ICD-10-CM | POA: Diagnosis not present

## 2017-08-25 NOTE — Telephone Encounter (Signed)
Patient daughter Lattie Corns called and advised her of message concerning referral to [facility].  Would like to know if patient should continue to take injection or wait until she is seen at [facility]?  Patient stated that injection is not helping.  Cb# is [phone].  Please advise.  Thank You.

## 2017-08-25 NOTE — Telephone Encounter (Signed)
Patient was seen at [facility] today and her care and medication management will me managed through them.

## 2017-08-25 NOTE — Telephone Encounter (Signed)
The patient is believed that Actemra is not helping or she may discontinue the medication.

## 2017-08-27 ENCOUNTER — Telehealth: Payer: Self-pay | Admitting: Family Medicine

## 2017-08-27 DIAGNOSIS — I129 Hypertensive chronic kidney disease with stage 1 through stage 4 chronic kidney disease, or unspecified chronic kidney disease: Secondary | ICD-10-CM | POA: Diagnosis not present

## 2017-08-27 DIAGNOSIS — I252 Old myocardial infarction: Secondary | ICD-10-CM | POA: Diagnosis not present

## 2017-08-27 DIAGNOSIS — N183 Chronic kidney disease, stage 3 (moderate): Secondary | ICD-10-CM | POA: Diagnosis not present

## 2017-08-27 DIAGNOSIS — Z7982 Long term (current) use of aspirin: Secondary | ICD-10-CM | POA: Diagnosis not present

## 2017-08-27 DIAGNOSIS — E1122 Type 2 diabetes mellitus with diabetic chronic kidney disease: Secondary | ICD-10-CM | POA: Diagnosis not present

## 2017-08-27 DIAGNOSIS — I2510 Atherosclerotic heart disease of native coronary artery without angina pectoris: Secondary | ICD-10-CM | POA: Diagnosis not present

## 2017-08-27 DIAGNOSIS — Z96653 Presence of artificial knee joint, bilateral: Secondary | ICD-10-CM | POA: Diagnosis not present

## 2017-08-27 DIAGNOSIS — Z8673 Personal history of transient ischemic attack (TIA), and cerebral infarction without residual deficits: Secondary | ICD-10-CM | POA: Diagnosis not present

## 2017-08-27 DIAGNOSIS — Z7952 Long term (current) use of systemic steroids: Secondary | ICD-10-CM | POA: Diagnosis not present

## 2017-08-27 DIAGNOSIS — L97819 Non-pressure chronic ulcer of other part of right lower leg with unspecified severity: Secondary | ICD-10-CM | POA: Diagnosis not present

## 2017-08-27 DIAGNOSIS — L97219 Non-pressure chronic ulcer of right calf with unspecified severity: Secondary | ICD-10-CM | POA: Diagnosis not present

## 2017-08-27 DIAGNOSIS — M109 Gout, unspecified: Secondary | ICD-10-CM | POA: Diagnosis not present

## 2017-08-27 DIAGNOSIS — R569 Unspecified convulsions: Secondary | ICD-10-CM | POA: Diagnosis not present

## 2017-08-27 DIAGNOSIS — E11622 Type 2 diabetes mellitus with other skin ulcer: Secondary | ICD-10-CM | POA: Diagnosis not present

## 2017-08-27 DIAGNOSIS — L97319 Non-pressure chronic ulcer of right ankle with unspecified severity: Secondary | ICD-10-CM | POA: Diagnosis not present

## 2017-08-27 DIAGNOSIS — M1990 Unspecified osteoarthritis, unspecified site: Secondary | ICD-10-CM | POA: Diagnosis not present

## 2017-08-27 DIAGNOSIS — Z7984 Long term (current) use of oral hypoglycemic drugs: Secondary | ICD-10-CM | POA: Diagnosis not present

## 2017-08-27 NOTE — Telephone Encounter (Signed)
Noreene Larsson with [facility] calling to inform clinical staff of patients BP reading, 160/100. Asking for return phone call when possible, states patient has taken meds as directed. Please advise.

## 2017-08-27 NOTE — Telephone Encounter (Signed)
Spoke with [name] [name].  Patient was having no other symptoms at the time.  [name] [name] states patient was angry because her cable was out.  [name] [name] was concerned because she has only seen the patient twice.

## 2017-08-27 NOTE — Telephone Encounter (Signed)
Spoke with patient.  She said she was feeling fine.  She had actually just checked her blood pressure and it had come down to 127/84.  She said she had been upset earlier in the day and was busy with a lot of company.

## 2017-08-30 DIAGNOSIS — L97819 Non-pressure chronic ulcer of other part of right lower leg with unspecified severity: Secondary | ICD-10-CM | POA: Diagnosis not present

## 2017-08-30 DIAGNOSIS — R569 Unspecified convulsions: Secondary | ICD-10-CM | POA: Diagnosis not present

## 2017-08-30 DIAGNOSIS — L97219 Non-pressure chronic ulcer of right calf with unspecified severity: Secondary | ICD-10-CM | POA: Diagnosis not present

## 2017-08-30 DIAGNOSIS — M109 Gout, unspecified: Secondary | ICD-10-CM | POA: Diagnosis not present

## 2017-08-30 DIAGNOSIS — I129 Hypertensive chronic kidney disease with stage 1 through stage 4 chronic kidney disease, or unspecified chronic kidney disease: Secondary | ICD-10-CM | POA: Diagnosis not present

## 2017-08-30 DIAGNOSIS — Z96653 Presence of artificial knee joint, bilateral: Secondary | ICD-10-CM | POA: Diagnosis not present

## 2017-08-30 DIAGNOSIS — Z8673 Personal history of transient ischemic attack (TIA), and cerebral infarction without residual deficits: Secondary | ICD-10-CM | POA: Diagnosis not present

## 2017-08-30 DIAGNOSIS — I252 Old myocardial infarction: Secondary | ICD-10-CM | POA: Diagnosis not present

## 2017-08-30 DIAGNOSIS — L97319 Non-pressure chronic ulcer of right ankle with unspecified severity: Secondary | ICD-10-CM | POA: Diagnosis not present

## 2017-08-30 DIAGNOSIS — N183 Chronic kidney disease, stage 3 (moderate): Secondary | ICD-10-CM | POA: Diagnosis not present

## 2017-08-30 DIAGNOSIS — I2510 Atherosclerotic heart disease of native coronary artery without angina pectoris: Secondary | ICD-10-CM | POA: Diagnosis not present

## 2017-08-30 DIAGNOSIS — E1122 Type 2 diabetes mellitus with diabetic chronic kidney disease: Secondary | ICD-10-CM | POA: Diagnosis not present

## 2017-08-30 DIAGNOSIS — Z7984 Long term (current) use of oral hypoglycemic drugs: Secondary | ICD-10-CM | POA: Diagnosis not present

## 2017-08-30 DIAGNOSIS — E11622 Type 2 diabetes mellitus with other skin ulcer: Secondary | ICD-10-CM | POA: Diagnosis not present

## 2017-08-30 DIAGNOSIS — Z7982 Long term (current) use of aspirin: Secondary | ICD-10-CM | POA: Diagnosis not present

## 2017-08-30 DIAGNOSIS — Z7952 Long term (current) use of systemic steroids: Secondary | ICD-10-CM | POA: Diagnosis not present

## 2017-08-30 DIAGNOSIS — M1990 Unspecified osteoarthritis, unspecified site: Secondary | ICD-10-CM | POA: Diagnosis not present

## 2017-09-01 DIAGNOSIS — I130 Hypertensive heart and chronic kidney disease with heart failure and stage 1 through stage 4 chronic kidney disease, or unspecified chronic kidney disease: Secondary | ICD-10-CM | POA: Diagnosis not present

## 2017-09-01 DIAGNOSIS — I2510 Atherosclerotic heart disease of native coronary artery without angina pectoris: Secondary | ICD-10-CM | POA: Diagnosis not present

## 2017-09-01 DIAGNOSIS — L97812 Non-pressure chronic ulcer of other part of right lower leg with fat layer exposed: Secondary | ICD-10-CM | POA: Diagnosis not present

## 2017-09-01 DIAGNOSIS — I5032 Chronic diastolic (congestive) heart failure: Secondary | ICD-10-CM | POA: Diagnosis not present

## 2017-09-01 DIAGNOSIS — M359 Systemic involvement of connective tissue, unspecified: Secondary | ICD-10-CM | POA: Diagnosis not present

## 2017-09-01 DIAGNOSIS — Z7984 Long term (current) use of oral hypoglycemic drugs: Secondary | ICD-10-CM | POA: Diagnosis not present

## 2017-09-01 DIAGNOSIS — E11622 Type 2 diabetes mellitus with other skin ulcer: Secondary | ICD-10-CM | POA: Diagnosis not present

## 2017-09-01 DIAGNOSIS — M069 Rheumatoid arthritis, unspecified: Secondary | ICD-10-CM | POA: Diagnosis not present

## 2017-09-01 DIAGNOSIS — N183 Chronic kidney disease, stage 3 (moderate): Secondary | ICD-10-CM | POA: Diagnosis not present

## 2017-09-01 DIAGNOSIS — E1122 Type 2 diabetes mellitus with diabetic chronic kidney disease: Secondary | ICD-10-CM | POA: Diagnosis not present

## 2017-09-01 DIAGNOSIS — Z7952 Long term (current) use of systemic steroids: Secondary | ICD-10-CM | POA: Diagnosis not present

## 2017-09-01 DIAGNOSIS — M0689 Other specified rheumatoid arthritis, multiple sites: Secondary | ICD-10-CM | POA: Diagnosis not present

## 2017-09-01 DIAGNOSIS — L97212 Non-pressure chronic ulcer of right calf with fat layer exposed: Secondary | ICD-10-CM | POA: Diagnosis not present

## 2017-09-03 DIAGNOSIS — I2510 Atherosclerotic heart disease of native coronary artery without angina pectoris: Secondary | ICD-10-CM | POA: Diagnosis not present

## 2017-09-03 DIAGNOSIS — M1990 Unspecified osteoarthritis, unspecified site: Secondary | ICD-10-CM | POA: Diagnosis not present

## 2017-09-03 DIAGNOSIS — Z7984 Long term (current) use of oral hypoglycemic drugs: Secondary | ICD-10-CM | POA: Diagnosis not present

## 2017-09-03 DIAGNOSIS — Z8673 Personal history of transient ischemic attack (TIA), and cerebral infarction without residual deficits: Secondary | ICD-10-CM | POA: Diagnosis not present

## 2017-09-03 DIAGNOSIS — L97819 Non-pressure chronic ulcer of other part of right lower leg with unspecified severity: Secondary | ICD-10-CM | POA: Diagnosis not present

## 2017-09-03 DIAGNOSIS — E1122 Type 2 diabetes mellitus with diabetic chronic kidney disease: Secondary | ICD-10-CM | POA: Diagnosis not present

## 2017-09-03 DIAGNOSIS — R569 Unspecified convulsions: Secondary | ICD-10-CM | POA: Diagnosis not present

## 2017-09-03 DIAGNOSIS — N183 Chronic kidney disease, stage 3 (moderate): Secondary | ICD-10-CM | POA: Diagnosis not present

## 2017-09-03 DIAGNOSIS — Z7952 Long term (current) use of systemic steroids: Secondary | ICD-10-CM | POA: Diagnosis not present

## 2017-09-03 DIAGNOSIS — Z7982 Long term (current) use of aspirin: Secondary | ICD-10-CM | POA: Diagnosis not present

## 2017-09-03 DIAGNOSIS — M109 Gout, unspecified: Secondary | ICD-10-CM | POA: Diagnosis not present

## 2017-09-03 DIAGNOSIS — E11622 Type 2 diabetes mellitus with other skin ulcer: Secondary | ICD-10-CM | POA: Diagnosis not present

## 2017-09-03 DIAGNOSIS — Z96653 Presence of artificial knee joint, bilateral: Secondary | ICD-10-CM | POA: Diagnosis not present

## 2017-09-03 DIAGNOSIS — L97319 Non-pressure chronic ulcer of right ankle with unspecified severity: Secondary | ICD-10-CM | POA: Diagnosis not present

## 2017-09-03 DIAGNOSIS — I129 Hypertensive chronic kidney disease with stage 1 through stage 4 chronic kidney disease, or unspecified chronic kidney disease: Secondary | ICD-10-CM | POA: Diagnosis not present

## 2017-09-03 DIAGNOSIS — L97219 Non-pressure chronic ulcer of right calf with unspecified severity: Secondary | ICD-10-CM | POA: Diagnosis not present

## 2017-09-03 DIAGNOSIS — I252 Old myocardial infarction: Secondary | ICD-10-CM | POA: Diagnosis not present

## 2017-09-06 ENCOUNTER — Other Ambulatory Visit: Payer: Self-pay | Admitting: Rheumatology

## 2017-09-08 DIAGNOSIS — Z7952 Long term (current) use of systemic steroids: Secondary | ICD-10-CM | POA: Diagnosis not present

## 2017-09-08 DIAGNOSIS — L97219 Non-pressure chronic ulcer of right calf with unspecified severity: Secondary | ICD-10-CM | POA: Diagnosis not present

## 2017-09-08 DIAGNOSIS — I129 Hypertensive chronic kidney disease with stage 1 through stage 4 chronic kidney disease, or unspecified chronic kidney disease: Secondary | ICD-10-CM | POA: Diagnosis not present

## 2017-09-08 DIAGNOSIS — Z7982 Long term (current) use of aspirin: Secondary | ICD-10-CM | POA: Diagnosis not present

## 2017-09-08 DIAGNOSIS — N183 Chronic kidney disease, stage 3 (moderate): Secondary | ICD-10-CM | POA: Diagnosis not present

## 2017-09-08 DIAGNOSIS — M359 Systemic involvement of connective tissue, unspecified: Secondary | ICD-10-CM | POA: Diagnosis not present

## 2017-09-08 DIAGNOSIS — Z8673 Personal history of transient ischemic attack (TIA), and cerebral infarction without residual deficits: Secondary | ICD-10-CM | POA: Diagnosis not present

## 2017-09-08 DIAGNOSIS — L97812 Non-pressure chronic ulcer of other part of right lower leg with fat layer exposed: Secondary | ICD-10-CM | POA: Diagnosis not present

## 2017-09-08 DIAGNOSIS — M0689 Other specified rheumatoid arthritis, multiple sites: Secondary | ICD-10-CM | POA: Diagnosis not present

## 2017-09-08 DIAGNOSIS — I2510 Atherosclerotic heart disease of native coronary artery without angina pectoris: Secondary | ICD-10-CM | POA: Diagnosis not present

## 2017-09-08 DIAGNOSIS — I5032 Chronic diastolic (congestive) heart failure: Secondary | ICD-10-CM | POA: Diagnosis not present

## 2017-09-08 DIAGNOSIS — E1122 Type 2 diabetes mellitus with diabetic chronic kidney disease: Secondary | ICD-10-CM | POA: Diagnosis not present

## 2017-09-08 DIAGNOSIS — M109 Gout, unspecified: Secondary | ICD-10-CM | POA: Diagnosis not present

## 2017-09-08 DIAGNOSIS — L97819 Non-pressure chronic ulcer of other part of right lower leg with unspecified severity: Secondary | ICD-10-CM | POA: Diagnosis not present

## 2017-09-08 DIAGNOSIS — I252 Old myocardial infarction: Secondary | ICD-10-CM | POA: Diagnosis not present

## 2017-09-08 DIAGNOSIS — Z96653 Presence of artificial knee joint, bilateral: Secondary | ICD-10-CM | POA: Diagnosis not present

## 2017-09-08 DIAGNOSIS — L97319 Non-pressure chronic ulcer of right ankle with unspecified severity: Secondary | ICD-10-CM | POA: Diagnosis not present

## 2017-09-08 DIAGNOSIS — M1990 Unspecified osteoarthritis, unspecified site: Secondary | ICD-10-CM | POA: Diagnosis not present

## 2017-09-08 DIAGNOSIS — M069 Rheumatoid arthritis, unspecified: Secondary | ICD-10-CM | POA: Diagnosis not present

## 2017-09-08 DIAGNOSIS — R569 Unspecified convulsions: Secondary | ICD-10-CM | POA: Diagnosis not present

## 2017-09-08 DIAGNOSIS — I130 Hypertensive heart and chronic kidney disease with heart failure and stage 1 through stage 4 chronic kidney disease, or unspecified chronic kidney disease: Secondary | ICD-10-CM | POA: Diagnosis not present

## 2017-09-08 DIAGNOSIS — L97212 Non-pressure chronic ulcer of right calf with fat layer exposed: Secondary | ICD-10-CM | POA: Diagnosis not present

## 2017-09-08 DIAGNOSIS — Z7984 Long term (current) use of oral hypoglycemic drugs: Secondary | ICD-10-CM | POA: Diagnosis not present

## 2017-09-08 DIAGNOSIS — E11622 Type 2 diabetes mellitus with other skin ulcer: Secondary | ICD-10-CM | POA: Diagnosis not present

## 2017-09-10 DIAGNOSIS — M1A09X1 Idiopathic chronic gout, multiple sites, with tophus (tophi): Secondary | ICD-10-CM | POA: Diagnosis not present

## 2017-09-10 DIAGNOSIS — Z8679 Personal history of other diseases of the circulatory system: Secondary | ICD-10-CM | POA: Diagnosis not present

## 2017-09-13 ENCOUNTER — Ambulatory Visit (INDEPENDENT_AMBULATORY_CARE_PROVIDER_SITE_OTHER): Payer: Medicare Other | Admitting: Family Medicine

## 2017-09-13 VITALS — HR 99 | Temp 98.2°F | Ht 64.0 in | Wt 188.0 lb

## 2017-09-13 DIAGNOSIS — Z23 Encounter for immunization: Secondary | ICD-10-CM

## 2017-09-13 DIAGNOSIS — M0609 Rheumatoid arthritis without rheumatoid factor, multiple sites: Secondary | ICD-10-CM | POA: Diagnosis not present

## 2017-09-13 DIAGNOSIS — M109 Gout, unspecified: Secondary | ICD-10-CM

## 2017-09-13 DIAGNOSIS — I10 Essential (primary) hypertension: Secondary | ICD-10-CM

## 2017-09-13 DIAGNOSIS — E119 Type 2 diabetes mellitus without complications: Secondary | ICD-10-CM

## 2017-09-13 DIAGNOSIS — E11622 Type 2 diabetes mellitus with other skin ulcer: Secondary | ICD-10-CM | POA: Diagnosis not present

## 2017-09-13 DIAGNOSIS — S81801D Unspecified open wound, right lower leg, subsequent encounter: Secondary | ICD-10-CM | POA: Diagnosis not present

## 2017-09-13 DIAGNOSIS — Z7984 Long term (current) use of oral hypoglycemic drugs: Secondary | ICD-10-CM | POA: Diagnosis not present

## 2017-09-13 LAB — COMPREHENSIVE METABOLIC PANEL
ALT: 14 U/L (ref 0–35)
AST: 18 U/L (ref 0–37)
Albumin: 4.2 g/dL (ref 3.5–5.2)
Alkaline Phosphatase: 46 U/L (ref 39–117)
BUN: 28 mg/dL — ABNORMAL HIGH (ref 6–23)
CO2: 24 mEq/L (ref 19–32)
Calcium: 9.9 mg/dL (ref 8.4–10.5)
Chloride: 103 mEq/L (ref 96–112)
Creatinine, Ser: 1 mg/dL (ref 0.40–1.20)
GFR: 67.7 mL/min (ref >60.00)
Glucose, Bld: 110 mg/dL — ABNORMAL HIGH (ref 70–99)
Potassium: 4.6 mEq/L (ref 3.5–5.1)
Sodium: 137 mEq/L (ref 135–145)
Total Bilirubin: 0.5 mg/dL (ref 0.2–1.2)
Total Protein: 6.6 g/dL (ref 6.0–8.3)

## 2017-09-13 LAB — HEMOGLOBIN A1C: Hgb A1c MFr Bld: 5.7 % (ref 4.6–6.5)

## 2017-09-13 LAB — URIC ACID: Uric Acid, Serum: 5.4 mg/dL (ref 2.4–7.0)

## 2017-09-13 MED ORDER — GABAPENTIN 100 MG PO CAPS: 100.0000 mg | ORAL_CAPSULE | Freq: Every day | ORAL | 3 refills | Status: DC

## 2017-09-13 MED ORDER — TRAMADOL HCL 50 MG PO TABS: ORAL_TABLET | ORAL | 5 refills | Status: DC

## 2017-09-13 MED ORDER — TRIAMCINOLONE ACETONIDE 0.5 % EX OINT: 1.0000 "application " | TOPICAL_OINTMENT | Freq: Two times a day (BID) | CUTANEOUS | 3 refills | Status: DC

## 2017-09-13 NOTE — Progress Notes (Addendum)
[name] [name] is a 83 y.o. female is here for follow up.  History of Present Illness:   HPI:   1. Type 2 diabetes mellitus without complication, without long-term current use of insulin ([place]).   Current symptoms: no polyuria or polydipsia, no chest pain, dyspnea or TIA's.   Lab Results  Component Value Date   HGBA1C 5.7 09/13/2017    No results found for: Derl Barrow   Lab Results  Component Value Date   CHOL 198 01/21/2017   HDL 77 01/21/2017   LDLCALC 108 (H) 01/21/2017   TRIG 65 01/21/2017   CHOLHDL 2.6 01/21/2017     Wt Readings from Last 3 Encounters:  09/13/17 188 lb (85.3 kg)  07/21/17 183 lb (83 kg)  06/08/17 183 lb 3.2 oz (83.1 kg)   BP Readings from Last 3 Encounters:  06/30/17 110/63  06/28/17 (!) 167/83  06/08/17 140/82   Lab Results  Component Value Date   CREATININE 1.00 09/13/2017     2. Rheumatoid arthritis of multiple sites with negative rheumatoid factor ([place]). Now going to [facility]. Notes reviewed.    3. Gout. Thought to be contributing to LE wounds.    4. Encounter for immunization   5. Essential hypertension. Labile. Cardiovascular ROS: no chest pain or dyspnea on exertion.  6. Wound of right lower extremity. Followed by Wound Care.   Health Maintenance Due  Topic Date Due  . OPHTHALMOLOGY EXAM  09/12/2015  . URINE MICROALBUMIN  07/04/2016  . PNA vac Low Risk Adult (2 of 2 - PCV13) 09/11/2016   Depression screen PHQ 2/9 06/28/2017 03/29/2017 03/04/2016  Decreased Interest 0 0 0  Down, Depressed, Hopeless 0 0 0  PHQ - 2 Score 0 0 0  Some recent data might be hidden   PMHx, SurgHx, SocialHx, FamHx, Medications, and Allergies were reviewed in the Visit Navigator and updated as appropriate.   Patient Active Problem List   Diagnosis Date Noted  . Acute on chronic diastolic heart failure ([place]) 09/12/2015    Priority: High  . CKD (chronic kidney disease) stage 3, GFR 30-59 ml/min (HCC) 06/05/2014    Priority: High  . DM (diabetes  mellitus), type 2 with renal complications ([place]) 28/00/3491    Priority: High    Class: Chronic  . Rheumatoid arthritis ([place]) 12/19/2013    Priority: High    Class: Chronic  . HTN (hypertension) 08/08/2013    Priority: High    Class: Chronic  . Rheumatic nodule 06/28/2017  . History of CHF (congestive heart failure) 02/18/2017  . History of diabetes mellitus 02/18/2017  . History of total knee arthroplasty, bilateral 02/18/2017  . History of rotator cuff surgery 02/18/2017  . Obesity (BMI 30.0-34.9) 12/08/2016  . High risk medications (not anticoagulants) long-term use 09/21/2016  . Hammer toe 10/31/2015  . Onychomycosis 04/24/2015  . Midline low back pain without sciatica 06/27/2014  . Bilateral edema of lower extremity 06/05/2014  . Anemia of chronic disease 05/01/2014  . Fluctuating blood pressure 02/06/2014  . Stroke ([place]) 01/17/2014  . CAD (coronary artery disease) 10/18/2013  . Fibromyalgia 05/10/2013  . Reactive airway disease 04/19/2013  . Gout 04/19/2013  . Hyperlipidemia 04/27/2007  . Allergic rhinitis 04/27/2007  . Diverticulosis 04/27/2007   Social History   Tobacco Use  . Smoking status: Never Smoker  . Smokeless tobacco: Never Used  Substance Use Topics  . Alcohol use: No    Alcohol/week: 0.0 oz  . Drug use: No   Current Medications and  Allergies:   .  albuterol (PROVENTIL HFA;VENTOLIN HFA) 108 (90 Base) MCG/ACT inhaler, Inhale 1 puff into the lungs every 6 (six) hours as needed for wheezing or shortness of breath., Disp: , Rfl:  .  allopurinol (ZYLOPRIM) 100 MG tablet, take 1 tablet by mouth once daily for GOUT (Patient taking differently: Take 100 mg by mouth every evening. ), Disp: 30 tablet, Rfl: 5 .  aspirin EC 81 MG tablet, Take 81 mg by mouth daily., Disp: , Rfl:  .  blood glucose meter kit and supplies KIT, Dispense based on patient and insurance preference. Use up to four times daily as directed. (FOR ICD-9 250.00, 250.01)., Disp: 1 each, Rfl: 0 .   calcium-vitamin D (OSCAL WITH D) 500-200 MG-UNIT per tablet, Take 1 tablet by mouth daily. , Disp: , Rfl:  .  CHLOROPHYLL PO, Take 5 drops by mouth See admin instructions. MIX INTO 6 OUNCES OF JUICE AND DRINK ONCE A DAY, Disp: , Rfl:  .  ferrous sulfate 325 (65 FE) MG tablet, Take 325 mg by mouth daily. , Disp: , Rfl:  .  folic acid (FOLVITE) 1 MG tablet, Take 1 tablet (1 mg total) by mouth daily., Disp: 90 tablet, Rfl: 4 .  furosemide (LASIX) 20 MG tablet, Take 1 tablet (20 mg total) by mouth every other day., Disp: 30 tablet, Rfl: 5 .  hydrALAZINE (APRESOLINE) 100 MG tablet, take 1 tablet by mouth three times a day, Disp: 90 tablet, Rfl: 0 .  hydroxypropyl methylcellulose / hypromellose (ISOPTO TEARS / GONIOVISC) 2.5 % ophthalmic solution, Place 1 drop into both eyes 4 (four) times daily as needed for dry eyes., Disp: 15 mL, Rfl: 2 .  ketoconazole (NIZORAL) 2 % cream, Apply 1 application topically 2 (two) times daily. (Patient taking differently: Apply 1 application topically 2 (two) times daily as needed for irritation. ), Disp: 30 g, Rfl: 0 .  labetalol (NORMODYNE) 100 MG tablet, take 2 tablets by mouth twice a day, Disp: 120 tablet, Rfl: 0 .  metFORMIN (GLUCOPHAGE) 500 MG tablet, take 1 tablet by mouth once daily for diabetes (Patient taking differently: Take 500 mg by mouth daily with breakfast. ), Disp: 90 tablet, Rfl: 1 .  Multiple Vitamins-Minerals (ALIVE ONCE DAILY WOMENS PO), Take 15 mLs by mouth every other day. , Disp: , Rfl:  .  predniSONE (DELTASONE) 5 MG tablet, take 1 tablet by mouth once daily WITH BREAKFAST, Disp: 30 tablet, Rfl: 0 .  traMADol (ULTRAM) 50 MG tablet, Take 1-2 tablets every 6 hours as needed for pain (Patient taking differently: Take 100 mg by mouth 2 (two) times daily. MORNING AND BEDTIME), Disp: 240 tablet, Rfl: 5 .  triamcinolone cream (KENALOG) 0.1 %, apply to affected area twice a day if needed for IRRITATION (Patient taking differently: Apply to affected area  twice a day if needed for IRRITATION), Disp: 45 g, Rfl: 2 .  vitamin C (ASCORBIC ACID) 500 MG tablet, Take 500 mg by mouth every other day. , Disp: , Rfl:    Allergies  Allergen Reactions  . Clonidine Derivatives Swelling    [name]  . Fish Allergy Diarrhea, Swelling and Other (See Comments)    Turns skin "black," but can tolerate white fish Salmon- Diarrhea  . Shellfish Allergy Hives  . Doxycycline Rash  . Indomethacin     Reaction not recalled by the patient  . Lyrica [Pregabalin]     Hallucinations   . Methyldopa  Aldomet (for hypertension): Reaction not recalled by the patient  . Orange Fruit [Citrus] Other (See Comments)    Indigestion/heartburn  . Cetirizine Hcl Itching and Rash  . Codeine Itching  . Levaquin [Levofloxacin In D5w] Rash  . Tomato Rash   Review of Systems   Pertinent items are noted in the HPI. Otherwise, ROS is negative.  Vitals:   Vitals:   09/13/17 1307  Pulse: 99  Temp: 98.2 F (36.8 C)  TempSrc: Oral  SpO2: 98%  Weight: 188 lb (85.3 kg)  Height: 5' 4"  (1.626 m)     Body mass index is 32.27 kg/m.   Physical Exam:   Physical Exam  Constitutional: She appears well-nourished.  HENT:  Head: Normocephalic and atraumatic.  Eyes: EOM are normal. Pupils are equal, round, and reactive to light.  Neck: Normal range of motion. Neck supple.  Cardiovascular: Normal rate, regular rhythm, normal heart sounds and intact distal pulses.  Pulmonary/Chest: Effort normal.  Abdominal: Soft.  Skin: Skin is warm.  Psychiatric: She has a normal mood and affect. Her behavior is normal.  Nursing note and vitals reviewed.   Results for orders placed or performed in visit on 07/21/17  CBC with Differential/Platelet  Result Value Ref Range   WBC 4.5 3.8 - 10.8 Thousand/uL   RBC 3.57 (L) 3.80 - 5.10 Million/uL   Hemoglobin 11.0 (L) 11.7 - 15.5 g/dL   HCT 33.2 (L) 35.0 - 45.0 %    MCV 93.0 80.0 - 100.0 fL   MCH 30.8 27.0 - 33.0 pg   MCHC 33.1 32.0 - 36.0 g/dL   RDW 16.7 (H) 11.0 - 15.0 %   Platelets 265 140 - 400 Thousand/uL   MPV 9.3 7.5 - 12.5 fL   Neutro Abs 2,417 1,500 - 7,800 cells/uL   Lymphs Abs 1,490 850 - 3,900 cells/uL   WBC mixed population 432 200 - 950 cells/uL   Eosinophils Absolute 131 15 - 500 cells/uL   Basophils Absolute 32 0 - 200 cells/uL   Neutrophils Relative % 53.7 %   Total Lymphocyte 33.1 %   Monocytes Relative 9.6 %   Eosinophils Relative 2.9 %   Basophils Relative 0.7 %  COMPLETE METABOLIC PANEL WITH GFR  Result Value Ref Range   Glucose, Bld 96 65 - 99 mg/dL   BUN 24 7 - 25 mg/dL   Creat 1.19 (H) 0.60 - 0.88 mg/dL   GFR, Est Non African American 42 (L) > OR = 60 mL/min/1.25m   GFR, Est African American 48 (L) > OR = 60 mL/min/1.757m  BUN/Creatinine Ratio 20 6 - 22 (calc)   Sodium 138 135 - 146 mmol/L   Potassium 4.0 3.5 - 5.3 mmol/L   Chloride 104 98 - 110 mmol/L   CO2 26 20 - 32 mmol/L   Calcium 9.3 8.6 - 10.4 mg/dL   Total Protein 6.6 6.1 - 8.1 g/dL   Albumin 3.7 3.6 - 5.1 g/dL   Globulin 2.9 1.9 - 3.7 g/dL (calc)   AG Ratio 1.3 1.0 - 2.5 (calc)   Total Bilirubin 0.3 0.2 - 1.2 mg/dL   Alkaline phosphatase (APISO) 43 33 - 130 U/L   AST 22 10 - 35 U/L   ALT 12 6 - 29 U/L  Quantiferon tb gold assay (blood)  Result Value Ref Range   QUANTIFERON(R)-TB GOLD NEGATIVE NEGATIVE   Quantiferon Nil Value 0.08 IU/mL   Mitogen-Nil >10.00 IU/mL   Quantiferon Tb Ag Minus Nil Value <0.00 IU/mL   Assessment  and Plan:   Mikhaila was seen today for follow-up.  Diagnoses and all orders for this visit:  Type 2 diabetes mellitus without complication, without long-term current use of insulin (HCC) -     Hemoglobin A1c  Rheumatoid arthritis of multiple sites with negative rheumatoid factor (HCC) Comments: Pain still severe.  Orders: -     traMADol (ULTRAM) 50 MG tablet; Take 1-2 tablets every 6 hours as needed for pain -     Comp  Met (CMET) -     Uric acid  Gout, unspecified cause, unspecified chronicity, unspecified site -     Comp Met (CMET) -     Uric acid  Encounter for immunization -     Flu vaccine HIGH DOSE PF  Essential hypertension Comments: Recent ECHO pending.   Wound of right lower extremity, subsequent encounter Comments: Ongoing. Seeing Wound Care. Painful. Orders: -     gabapentin (NEURONTIN) 100 MG capsule; Take 1 capsule (100 mg total) at bedtime by mouth.   . Reviewed expectations re: course of current medical issues. . Discussed self-management of symptoms. . Outlined signs and symptoms indicating need for more acute intervention. . Patient verbalized understanding and all questions were answered. Marland Kitchen Health Maintenance issues including appropriate healthy diet, exercise, and smoking avoidance were discussed with patient. . See orders for this visit as documented in the electronic medical record. . Patient received an After Visit Summary.  Briscoe Deutscher, DO [place] 09/14/2017  Future Appointments  Date Time Provider [place]  10/21/2017  9:00 AM Bo Merino, MD [facility] None  10/28/2017  1:30 PM Camelia Phenes, DPM [facility]  03/14/2018  1:00 PM Briscoe Deutscher, DO [facility] None

## 2017-09-14 ENCOUNTER — Encounter: Payer: Self-pay | Admitting: Family Medicine

## 2017-09-15 ENCOUNTER — Other Ambulatory Visit: Payer: Self-pay | Admitting: Family Medicine

## 2017-09-15 DIAGNOSIS — N183 Chronic kidney disease, stage 3 (moderate): Secondary | ICD-10-CM | POA: Diagnosis not present

## 2017-09-15 DIAGNOSIS — I10 Essential (primary) hypertension: Secondary | ICD-10-CM

## 2017-09-15 DIAGNOSIS — E1122 Type 2 diabetes mellitus with diabetic chronic kidney disease: Secondary | ICD-10-CM | POA: Diagnosis not present

## 2017-09-15 DIAGNOSIS — I2510 Atherosclerotic heart disease of native coronary artery without angina pectoris: Secondary | ICD-10-CM | POA: Diagnosis not present

## 2017-09-15 DIAGNOSIS — L97219 Non-pressure chronic ulcer of right calf with unspecified severity: Secondary | ICD-10-CM | POA: Diagnosis not present

## 2017-09-15 DIAGNOSIS — M109 Gout, unspecified: Secondary | ICD-10-CM | POA: Diagnosis not present

## 2017-09-15 DIAGNOSIS — L97319 Non-pressure chronic ulcer of right ankle with unspecified severity: Secondary | ICD-10-CM | POA: Diagnosis not present

## 2017-09-15 DIAGNOSIS — I252 Old myocardial infarction: Secondary | ICD-10-CM | POA: Diagnosis not present

## 2017-09-15 DIAGNOSIS — L97819 Non-pressure chronic ulcer of other part of right lower leg with unspecified severity: Secondary | ICD-10-CM | POA: Diagnosis not present

## 2017-09-15 DIAGNOSIS — Z7984 Long term (current) use of oral hypoglycemic drugs: Secondary | ICD-10-CM | POA: Diagnosis not present

## 2017-09-15 DIAGNOSIS — M1990 Unspecified osteoarthritis, unspecified site: Secondary | ICD-10-CM | POA: Diagnosis not present

## 2017-09-15 DIAGNOSIS — I129 Hypertensive chronic kidney disease with stage 1 through stage 4 chronic kidney disease, or unspecified chronic kidney disease: Secondary | ICD-10-CM | POA: Diagnosis not present

## 2017-09-15 DIAGNOSIS — E11622 Type 2 diabetes mellitus with other skin ulcer: Secondary | ICD-10-CM | POA: Diagnosis not present

## 2017-09-15 DIAGNOSIS — Z7952 Long term (current) use of systemic steroids: Secondary | ICD-10-CM | POA: Diagnosis not present

## 2017-09-15 DIAGNOSIS — R569 Unspecified convulsions: Secondary | ICD-10-CM | POA: Diagnosis not present

## 2017-09-15 DIAGNOSIS — Z7982 Long term (current) use of aspirin: Secondary | ICD-10-CM | POA: Diagnosis not present

## 2017-09-15 DIAGNOSIS — Z8673 Personal history of transient ischemic attack (TIA), and cerebral infarction without residual deficits: Secondary | ICD-10-CM | POA: Diagnosis not present

## 2017-09-15 DIAGNOSIS — Z96653 Presence of artificial knee joint, bilateral: Secondary | ICD-10-CM | POA: Diagnosis not present

## 2017-09-16 ENCOUNTER — Telehealth: Payer: Self-pay | Admitting: Family Medicine

## 2017-09-16 NOTE — Telephone Encounter (Signed)
[name] called in reference to patient receiving flu shot Monday 09/13/17 and Patient has redness all around injection site. [name] stated patient has no other symptoms. Please call patient's daughter and advise. OK to leave message.

## 2017-09-16 NOTE — Telephone Encounter (Signed)
Spoke with patient and her daughter about flu shot. I advised that the redness should go away in a couple days. Patient stated that she just took the band aid off yesterday. I advised that if she starts to get a knot then could put a warm wash cloth on injection site. Patient and daughter verbalized understanding. They will call with any other concerns.

## 2017-09-17 DIAGNOSIS — R260 Ataxic gait: Secondary | ICD-10-CM | POA: Diagnosis not present

## 2017-09-22 ENCOUNTER — Other Ambulatory Visit: Payer: Self-pay | Admitting: Internal Medicine

## 2017-09-22 ENCOUNTER — Encounter (HOSPITAL_BASED_OUTPATIENT_CLINIC_OR_DEPARTMENT_OTHER): Payer: Medicare Other | Attending: Surgery

## 2017-09-22 DIAGNOSIS — I509 Heart failure, unspecified: Secondary | ICD-10-CM | POA: Insufficient documentation

## 2017-09-22 DIAGNOSIS — L97319 Non-pressure chronic ulcer of right ankle with unspecified severity: Secondary | ICD-10-CM | POA: Diagnosis not present

## 2017-09-22 DIAGNOSIS — I110 Hypertensive heart disease with heart failure: Secondary | ICD-10-CM | POA: Insufficient documentation

## 2017-09-22 DIAGNOSIS — M069 Rheumatoid arthritis, unspecified: Secondary | ICD-10-CM | POA: Insufficient documentation

## 2017-09-22 DIAGNOSIS — M0689 Other specified rheumatoid arthritis, multiple sites: Secondary | ICD-10-CM | POA: Insufficient documentation

## 2017-09-22 DIAGNOSIS — N183 Chronic kidney disease, stage 3 (moderate): Secondary | ICD-10-CM | POA: Diagnosis not present

## 2017-09-22 DIAGNOSIS — L97819 Non-pressure chronic ulcer of other part of right lower leg with unspecified severity: Secondary | ICD-10-CM | POA: Diagnosis not present

## 2017-09-22 DIAGNOSIS — I129 Hypertensive chronic kidney disease with stage 1 through stage 4 chronic kidney disease, or unspecified chronic kidney disease: Secondary | ICD-10-CM | POA: Diagnosis not present

## 2017-09-22 DIAGNOSIS — Z8673 Personal history of transient ischemic attack (TIA), and cerebral infarction without residual deficits: Secondary | ICD-10-CM | POA: Diagnosis not present

## 2017-09-22 DIAGNOSIS — Z7982 Long term (current) use of aspirin: Secondary | ICD-10-CM | POA: Diagnosis not present

## 2017-09-22 DIAGNOSIS — R569 Unspecified convulsions: Secondary | ICD-10-CM | POA: Diagnosis not present

## 2017-09-22 DIAGNOSIS — L97219 Non-pressure chronic ulcer of right calf with unspecified severity: Secondary | ICD-10-CM | POA: Diagnosis not present

## 2017-09-22 DIAGNOSIS — E119 Type 2 diabetes mellitus without complications: Secondary | ICD-10-CM | POA: Insufficient documentation

## 2017-09-22 DIAGNOSIS — M109 Gout, unspecified: Secondary | ICD-10-CM | POA: Diagnosis not present

## 2017-09-22 DIAGNOSIS — L97212 Non-pressure chronic ulcer of right calf with fat layer exposed: Secondary | ICD-10-CM | POA: Insufficient documentation

## 2017-09-22 DIAGNOSIS — Z96653 Presence of artificial knee joint, bilateral: Secondary | ICD-10-CM | POA: Diagnosis not present

## 2017-09-22 DIAGNOSIS — L97812 Non-pressure chronic ulcer of other part of right lower leg with fat layer exposed: Secondary | ICD-10-CM | POA: Insufficient documentation

## 2017-09-22 DIAGNOSIS — I252 Old myocardial infarction: Secondary | ICD-10-CM | POA: Diagnosis not present

## 2017-09-22 DIAGNOSIS — E11622 Type 2 diabetes mellitus with other skin ulcer: Secondary | ICD-10-CM | POA: Diagnosis not present

## 2017-09-22 DIAGNOSIS — I2510 Atherosclerotic heart disease of native coronary artery without angina pectoris: Secondary | ICD-10-CM | POA: Diagnosis not present

## 2017-09-22 DIAGNOSIS — E1122 Type 2 diabetes mellitus with diabetic chronic kidney disease: Secondary | ICD-10-CM | POA: Diagnosis not present

## 2017-09-22 DIAGNOSIS — Z7952 Long term (current) use of systemic steroids: Secondary | ICD-10-CM | POA: Diagnosis not present

## 2017-09-22 DIAGNOSIS — Z7984 Long term (current) use of oral hypoglycemic drugs: Secondary | ICD-10-CM | POA: Diagnosis not present

## 2017-09-22 DIAGNOSIS — M1990 Unspecified osteoarthritis, unspecified site: Secondary | ICD-10-CM | POA: Diagnosis not present

## 2017-09-29 DIAGNOSIS — I129 Hypertensive chronic kidney disease with stage 1 through stage 4 chronic kidney disease, or unspecified chronic kidney disease: Secondary | ICD-10-CM | POA: Diagnosis not present

## 2017-09-29 DIAGNOSIS — I252 Old myocardial infarction: Secondary | ICD-10-CM | POA: Diagnosis not present

## 2017-09-29 DIAGNOSIS — I2510 Atherosclerotic heart disease of native coronary artery without angina pectoris: Secondary | ICD-10-CM | POA: Diagnosis not present

## 2017-09-29 DIAGNOSIS — L97819 Non-pressure chronic ulcer of other part of right lower leg with unspecified severity: Secondary | ICD-10-CM | POA: Diagnosis not present

## 2017-09-29 DIAGNOSIS — E1122 Type 2 diabetes mellitus with diabetic chronic kidney disease: Secondary | ICD-10-CM | POA: Diagnosis not present

## 2017-09-29 DIAGNOSIS — L97319 Non-pressure chronic ulcer of right ankle with unspecified severity: Secondary | ICD-10-CM | POA: Diagnosis not present

## 2017-09-29 DIAGNOSIS — Z96653 Presence of artificial knee joint, bilateral: Secondary | ICD-10-CM | POA: Diagnosis not present

## 2017-09-29 DIAGNOSIS — L97219 Non-pressure chronic ulcer of right calf with unspecified severity: Secondary | ICD-10-CM | POA: Diagnosis not present

## 2017-09-29 DIAGNOSIS — Z8673 Personal history of transient ischemic attack (TIA), and cerebral infarction without residual deficits: Secondary | ICD-10-CM | POA: Diagnosis not present

## 2017-09-29 DIAGNOSIS — Z7984 Long term (current) use of oral hypoglycemic drugs: Secondary | ICD-10-CM | POA: Diagnosis not present

## 2017-09-29 DIAGNOSIS — Z7952 Long term (current) use of systemic steroids: Secondary | ICD-10-CM | POA: Diagnosis not present

## 2017-09-29 DIAGNOSIS — M109 Gout, unspecified: Secondary | ICD-10-CM | POA: Diagnosis not present

## 2017-09-29 DIAGNOSIS — N183 Chronic kidney disease, stage 3 (moderate): Secondary | ICD-10-CM | POA: Diagnosis not present

## 2017-09-29 DIAGNOSIS — Z7982 Long term (current) use of aspirin: Secondary | ICD-10-CM | POA: Diagnosis not present

## 2017-09-29 DIAGNOSIS — E11622 Type 2 diabetes mellitus with other skin ulcer: Secondary | ICD-10-CM | POA: Diagnosis not present

## 2017-09-29 DIAGNOSIS — R569 Unspecified convulsions: Secondary | ICD-10-CM | POA: Diagnosis not present

## 2017-09-29 DIAGNOSIS — M1990 Unspecified osteoarthritis, unspecified site: Secondary | ICD-10-CM | POA: Diagnosis not present

## 2017-10-05 DIAGNOSIS — L97819 Non-pressure chronic ulcer of other part of right lower leg with unspecified severity: Secondary | ICD-10-CM | POA: Diagnosis not present

## 2017-10-05 DIAGNOSIS — Z7952 Long term (current) use of systemic steroids: Secondary | ICD-10-CM | POA: Diagnosis not present

## 2017-10-05 DIAGNOSIS — I252 Old myocardial infarction: Secondary | ICD-10-CM | POA: Diagnosis not present

## 2017-10-05 DIAGNOSIS — E1122 Type 2 diabetes mellitus with diabetic chronic kidney disease: Secondary | ICD-10-CM | POA: Diagnosis not present

## 2017-10-05 DIAGNOSIS — L97319 Non-pressure chronic ulcer of right ankle with unspecified severity: Secondary | ICD-10-CM | POA: Diagnosis not present

## 2017-10-05 DIAGNOSIS — I110 Hypertensive heart disease with heart failure: Secondary | ICD-10-CM | POA: Diagnosis not present

## 2017-10-05 DIAGNOSIS — M359 Systemic involvement of connective tissue, unspecified: Secondary | ICD-10-CM | POA: Diagnosis not present

## 2017-10-05 DIAGNOSIS — M069 Rheumatoid arthritis, unspecified: Secondary | ICD-10-CM | POA: Diagnosis not present

## 2017-10-05 DIAGNOSIS — Z7984 Long term (current) use of oral hypoglycemic drugs: Secondary | ICD-10-CM | POA: Diagnosis not present

## 2017-10-05 DIAGNOSIS — L97219 Non-pressure chronic ulcer of right calf with unspecified severity: Secondary | ICD-10-CM | POA: Diagnosis not present

## 2017-10-05 DIAGNOSIS — I129 Hypertensive chronic kidney disease with stage 1 through stage 4 chronic kidney disease, or unspecified chronic kidney disease: Secondary | ICD-10-CM | POA: Diagnosis not present

## 2017-10-05 DIAGNOSIS — Z96653 Presence of artificial knee joint, bilateral: Secondary | ICD-10-CM | POA: Diagnosis not present

## 2017-10-05 DIAGNOSIS — N183 Chronic kidney disease, stage 3 (moderate): Secondary | ICD-10-CM | POA: Diagnosis not present

## 2017-10-05 DIAGNOSIS — Z7982 Long term (current) use of aspirin: Secondary | ICD-10-CM | POA: Diagnosis not present

## 2017-10-05 DIAGNOSIS — L97212 Non-pressure chronic ulcer of right calf with fat layer exposed: Secondary | ICD-10-CM | POA: Diagnosis not present

## 2017-10-05 DIAGNOSIS — E119 Type 2 diabetes mellitus without complications: Secondary | ICD-10-CM | POA: Diagnosis not present

## 2017-10-05 DIAGNOSIS — M0689 Other specified rheumatoid arthritis, multiple sites: Secondary | ICD-10-CM | POA: Diagnosis not present

## 2017-10-05 DIAGNOSIS — E11622 Type 2 diabetes mellitus with other skin ulcer: Secondary | ICD-10-CM | POA: Diagnosis not present

## 2017-10-05 DIAGNOSIS — Z8673 Personal history of transient ischemic attack (TIA), and cerebral infarction without residual deficits: Secondary | ICD-10-CM | POA: Diagnosis not present

## 2017-10-05 DIAGNOSIS — I2510 Atherosclerotic heart disease of native coronary artery without angina pectoris: Secondary | ICD-10-CM | POA: Diagnosis not present

## 2017-10-05 DIAGNOSIS — L97812 Non-pressure chronic ulcer of other part of right lower leg with fat layer exposed: Secondary | ICD-10-CM | POA: Diagnosis not present

## 2017-10-05 DIAGNOSIS — R569 Unspecified convulsions: Secondary | ICD-10-CM | POA: Diagnosis not present

## 2017-10-05 DIAGNOSIS — M109 Gout, unspecified: Secondary | ICD-10-CM | POA: Diagnosis not present

## 2017-10-05 DIAGNOSIS — I509 Heart failure, unspecified: Secondary | ICD-10-CM | POA: Diagnosis not present

## 2017-10-05 DIAGNOSIS — M1990 Unspecified osteoarthritis, unspecified site: Secondary | ICD-10-CM | POA: Diagnosis not present

## 2017-10-07 DIAGNOSIS — L97212 Non-pressure chronic ulcer of right calf with fat layer exposed: Secondary | ICD-10-CM | POA: Diagnosis not present

## 2017-10-07 DIAGNOSIS — S76399A Other specified injury of muscle, fascia and tendon of the posterior muscle group at thigh level, unspecified thigh, initial encounter: Secondary | ICD-10-CM | POA: Diagnosis not present

## 2017-10-07 DIAGNOSIS — M259 Joint disorder, unspecified: Secondary | ICD-10-CM | POA: Diagnosis not present

## 2017-10-07 DIAGNOSIS — I10 Essential (primary) hypertension: Secondary | ICD-10-CM | POA: Diagnosis not present

## 2017-10-07 DIAGNOSIS — L88 Pyoderma gangrenosum: Secondary | ICD-10-CM | POA: Diagnosis not present

## 2017-10-10 NOTE — Progress Notes (Deleted)
Office Visit Note  Patient: [name] [name]             Date of Birth: 03-02-1930           MRN: [id number]             PCP: Helane Rima, DO Referring: Helane Rima, DO Visit Date: 10/21/2017 Occupation: @GUAROCC @    Subjective:  No chief complaint on file.   History of Present Illness: [name] [name] is a 83 y.o. female ***   Activities of Daily Living:  Patient reports morning stiffness for *** {minute/hour:19697}.   Patient {ACTIONS;DENIES/REPORTS:21021675::"Denies"} nocturnal pain.  Difficulty dressing/grooming: {ACTIONS;DENIES/REPORTS:21021675::"Denies"} Difficulty climbing stairs: {ACTIONS;DENIES/REPORTS:21021675::"Denies"} Difficulty getting out of chair: {ACTIONS;DENIES/REPORTS:21021675::"Denies"} Difficulty using hands for taps, buttons, cutlery, and/or writing: {ACTIONS;DENIES/REPORTS:21021675::"Denies"}   No Rheumatology ROS completed.   PMFS History:  Patient Active Problem List   Diagnosis Date Noted  . Rheumatic nodule 06/28/2017  . History of CHF (congestive heart failure) 02/18/2017  . History of diabetes mellitus 02/18/2017  . History of total knee arthroplasty, bilateral 02/18/2017  . History of rotator cuff surgery 02/18/2017  . Obesity (BMI 30.0-34.9) 12/08/2016  . High risk medications (not anticoagulants) long-term use 09/21/2016  . Hammer toe 10/31/2015  . Acute on chronic diastolic heart failure (HCC) 09/12/2015  . Onychomycosis 04/24/2015  . Midline low back pain without sciatica 06/27/2014  . Bilateral edema of lower extremity 06/05/2014  . CKD (chronic kidney disease) stage 3, GFR 30-59 ml/min (HCC) 06/05/2014  . DM (diabetes mellitus), type 2 with renal complications (HCC) 06/05/2014    Class: Chronic  . Anemia of chronic disease 05/01/2014  . Fluctuating blood pressure 02/06/2014  . Stroke (HCC) 01/17/2014  . Rheumatoid arthritis (HCC) 12/19/2013    Class: Chronic  . CAD (coronary artery disease) 10/18/2013  . HTN  (hypertension) 08/08/2013    Class: Chronic  . Fibromyalgia 05/10/2013  . Reactive airway disease 04/19/2013  . Gout 04/19/2013  . Hyperlipidemia 04/27/2007  . Allergic rhinitis 04/27/2007  . Diverticulosis 04/27/2007    Past Medical History:  Diagnosis Date  . Allergic rhinitis due to pollen 04/27/2007  . Arthritis    "in q joint" (08/07/2013)  . Coronary atherosclerosis of native coronary artery   . Cough   . Disorder of bone and cartilage, unspecified   . Edema 05/03/2013  . Exertional shortness of breath    "sometimes" (08/07/2013)  . First degree atrioventricular block   . Gout, unspecified 04/19/2013  . Hyperlipidemia 04/27/2007  . Hypertension   . Muscle spasm   . Muscle weakness (generalized)   . Myocardial infarction (HCC) ~ 1970  . Onychia and paronychia of toe   . Osteoarthrosis, unspecified whether generalized or localized, unspecified site 04/27/2007  . Other fall   . Other malaise and fatigue   . Prepatellar bursitis   . Seizures (HCC)   . Stroke (HCC) 01/17/2014  . TIA (transient ischemic attack)    "a few one summer" (08/07/2013)  . Type II diabetes mellitus (HCC)    "fasting 90-110s" (08/07/2013)  . Unspecified essential hypertension 08/08/2013  . Unspecified vitamin D deficiency     Family History  Problem Relation Age of Onset  . Heart attack Father    Past Surgical History:  Procedure Laterality Date  . ABDOMINAL HYSTERECTOMY  04/1980  . APPENDECTOMY  1960  . CARDIAC CATHETERIZATION  2003  . CATARACT EXTRACTION W/ INTRAOCULAR LENS  IMPLANT, BILATERAL Bilateral ~ 2012  . JOINT REPLACEMENT    . REPLACEMENT TOTAL KNEE  Right 09/2005  . SHOULDER OPEN ROTATOR CUFF REPAIR Right 07/1999  . TONSILLECTOMY  08/1974  . TOTAL KNEE ARTHROPLASTY Left 08/07/2013   Procedure: TOTAL KNEE ARTHROPLASTY- LEFT;  Surgeon: Dannielle Huh, MD;  Location: [facility] OR;  Service: Orthopedics;  Laterality: Left;  . TRANSTHORACIC ECHOCARDIOGRAM  2003   EF 55-65%; mild concentric LVH     Social History   Social History Narrative   Widowed   Walks with cane     Objective: Vital Signs: There were no vitals taken for this visit.   Physical Exam   Musculoskeletal Exam: ***  CDAI Exam: No CDAI exam completed.    Investigation: No additional findings.TB Gold: 07/21/2017 Negative  CBC Latest Ref Rng & Units 07/21/2017 07/05/2017 06/08/2017  WBC 3.8 - 10.8 Thousand/uL 4.5 7.4 14.4(H)  Hemoglobin 11.7 - 15.5 g/dL 11.0(L) 10.9(A) 11.1(L)  Hematocrit 35.0 - 45.0 % 33.2(L) 31(A) 34.5(L)  Platelets 140 - 400 Thousand/uL 265 278 336.0   CMP Latest Ref Rng & Units 09/13/2017 07/21/2017 07/05/2017  Glucose 70 - 99 mg/dL 945(W) 96 -  BUN 6 - 23 mg/dL 38(U) 24 82(C)  Creatinine 0.40 - 1.20 mg/dL 0.03 4.91(P) 0.9  Sodium 135 - 145 mEq/L 137 138 140  Potassium 3.5 - 5.1 mEq/L 4.6 4.0 4.1  Chloride 96 - 112 mEq/L 103 104 -  CO2 19 - 32 mEq/L 24 26 -  Calcium 8.4 - 10.5 mg/dL 9.9 9.3 -  Total Protein 6.0 - 8.3 g/dL 6.6 6.6 -  Total Bilirubin 0.2 - 1.2 mg/dL 0.5 0.3 -  Alkaline Phos 39 - 117 U/L 46 - 56  AST 0 - 37 U/L 18 22 20   ALT 0 - 35 U/L 14 12 15     Imaging: No results found.  Speciality Comments: No specialty comments available.    Procedures:  No procedures performed Allergies: Clonidine derivatives; Fish allergy; Shellfish allergy; Doxycycline; Indomethacin; Lyrica [pregabalin]; Methyldopa; Orange fruit [citrus]; Cetirizine hcl; Codeine; Levaquin [levofloxacin in d5w]; and Tomato   Assessment / Plan:     Visit Diagnoses: No diagnosis found.    Orders: No orders of the defined types were placed in this encounter.  No orders of the defined types were placed in this encounter.   Face-to-face time spent with patient was *** minutes. 50% of time was spent in counseling and coordination of care.  Follow-Up Instructions: No Follow-up on file.   , CMA  Note - This record has been created using .  Chart creation errors have  been sought, but may not always  have been located. Such creation errors do not reflect on  the standard of medical care.

## 2017-10-12 ENCOUNTER — Ambulatory Visit (INDEPENDENT_AMBULATORY_CARE_PROVIDER_SITE_OTHER): Payer: Medicare Other | Admitting: Family Medicine

## 2017-10-12 ENCOUNTER — Ambulatory Visit: Payer: Self-pay

## 2017-10-12 ENCOUNTER — Encounter: Payer: Self-pay | Admitting: Family Medicine

## 2017-10-12 VITALS — BP 150/82 | HR 72 | Temp 98.6°F | Wt 185.2 lb

## 2017-10-12 DIAGNOSIS — M7501 Adhesive capsulitis of right shoulder: Secondary | ICD-10-CM | POA: Diagnosis not present

## 2017-10-12 DIAGNOSIS — M25511 Pain in right shoulder: Secondary | ICD-10-CM | POA: Diagnosis not present

## 2017-10-12 NOTE — Procedures (Addendum)
Dr. Earlene Plater requested an ultrasound evaluation and possible intervention based on her exam note today.  Please see that information for further history.  In brief she has a very large synovial cyst that is tracking 15 cm down the anterior axillary fold.  Successful aspiration.  We will send for cell count.   PROCEDURE NOTE -  ULTRASOUND GUIDEDAspiration and Injection: Right shoulder large synovial cyst, anterior Images were obtained and interpreted by myself, Gaspar Bidding, DO  Images have been saved and stored to PACS system. Images obtained on: GE S7 Ultrasound machine  ULTRASOUND FINDINGS:  Very large anterior synovial cyst tracking from the anterior joint capsule that showed significant degenerative spurring tracking down into the pectoralis muscle superficial to the neurovascular structures but deep to the pectoralis muscle.  Total length was 15 cm utilizing logic view.  DESCRIPTION OF PROCEDURE:  The patient's clinical condition is marked by substantial pain and/or significant functional disability. Other conservative therapy has not provided relief, is contraindicated, or not appropriate. There is a reasonable likelihood that injection will significantly improve the patient's pain and/or functional impairment.  After discussing the risks, benefits and expected outcomes of the injection and all questions were reviewed and answered, the patient wished to undergo the above named procedure. Verbal consent was obtained.  The ultrasound was used to identify the target structure and adjacent neurovascular structures. The skin was then prepped in sterile fashion and the target structure was injected under direct visualization using sterile technique as below:  Right PREP: Alcohol, Ethel Chloride, 5cc 1% lidocaine on 25g 1.5 in. Needle -patient had a very small amount of spontaneous decompression following lidocaine of straw-colored synovial fluid APPROACH: Anterior, in plane,, stopcock  technique, 18g 1.5in. INJECTATE: 5cc 1% lidocaine, 1cc 0.5% marcaine, 1cc 80mg /mL DepoMedrol  ASPIRATE: 28 cc of straw-colored synovial fluid, slightly cloudy DRESSING: Band-Aid    Post procedural instructions including recommending icing and warning signs for infection were reviewed.  This procedure was well tolerated and there were no complications.   IMPRESSION: Succesful Guided Aspiration and Injection

## 2017-10-12 NOTE — Patient Instructions (Signed)
You had an injection today.  Things to be aware of after injection are listed below: . You may experience no significant improvement or even a slight worsening in your symptoms during the first 24 to 48 hours.  After that we expect your symptoms to improve gradually over the next 2 weeks for the medicine to have its maximal effect.  You should continue to have improvement out to 6 weeks after your injection. . Dr. Rigby recommends icing the site of the injection for 20 minutes  1-2 times the day of your injection . You may shower but no swimming, tub bath or Jacuzzi for 24 hours. . If your bandage falls off this does not need to be replaced.  It is appropriate to remove the bandage after 4 hours. . You may resume light activities as tolerated unless otherwise directed per Dr. Rigby during your visit  POSSIBLE STEROID SIDE EFFECTS:  Side effects from injectable steroids tend to be less than when taken orally however you may experience some of the symptoms listed below.  If experienced these should only last for a short period of time. Change in menstrual flow  Edema (swelling)  Increased appetite Skin flushing (redness)  Skin rash/acne  Thrush (oral) Yeast vaginitis    Increased sweating  Depression Increased blood glucose levels Cramping and leg/calf  Euphoria (feeling happy)  POSSIBLE PROCEDURE SIDE EFFECTS: The side effects of the injection are usually fairly minimal however if you may experience some of the following side effects that are usually self-limited and will is off on their own.  If you are concerned please feel free to call the office with questions:  Increased numbness or tingling  Nausea or vomiting  Swelling or bruising at the injection site   Please call our office if if you experience any of the following symptoms over the next week as these can be signs of infection:   Fever greater than 100.5F  Significant swelling at the injection site  Significant redness or drainage  from the injection site  If after 2 weeks you are continuing to have worsening symptoms please call our office to discuss what the next appropriate actions should be including the potential for a return office visit or other diagnostic testing.    

## 2017-10-12 NOTE — Progress Notes (Signed)
[name] [name] is a 83 y.o. female here for an acute visit.  History of Present Illness:   HPI: See Assessment and Plan section for Problem Based Charting of issues discussed today.  PMHx, SurgHx, SocialHx, Medications, and Allergies were reviewed in the Visit Navigator and updated as appropriate.  Current Medications:   .  albuterol (PROVENTIL HFA;VENTOLIN HFA) 108 (90 Base) MCG/ACT inhaler, Inhale 1 puff into the lungs every 6 (six) hours as needed for wheezing or shortness of breath., Disp: , Rfl:  .  allopurinol (ZYLOPRIM) 100 MG tablet, take 1 tablet by mouth once daily for GOUT (Patient taking differently: Take 100 mg by mouth every evening. ), Disp: 30 tablet, Rfl: 5 .  aspirin EC 81 MG tablet, Take 81 mg by mouth daily., Disp: , Rfl:  .  blood glucose meter kit and supplies KIT, Dispense based on patient and insurance preference. Use up to four times daily as directed. (FOR ICD-9 250.00, 250.01)., Disp: 1 each, Rfl: 0 .  calcium-vitamin D (OSCAL WITH D) 500-200 MG-UNIT per tablet, Take 1 tablet by mouth daily. , Disp: , Rfl:  .  CHLOROPHYLL PO, Take 5 drops by mouth See admin instructions. MIX INTO 6 OUNCES OF JUICE AND DRINK ONCE A DAY, Disp: , Rfl:  .  ferrous sulfate 325 (65 FE) MG tablet, Take 325 mg by mouth daily. , Disp: , Rfl:  .  folic acid (FOLVITE) 1 MG tablet, Take 1 tablet (1 mg total) by mouth daily., Disp: 90 tablet, Rfl: 4 .  furosemide (LASIX) 20 MG tablet, Take 1 tablet (20 mg total) by mouth every other day., Disp: 30 tablet, Rfl: 5 .  gabapentin (NEURONTIN) 100 MG capsule, Take 1 capsule (100 mg total) at bedtime by mouth., Disp: 30 capsule, Rfl: 3 .  hydrALAZINE (APRESOLINE) 100 MG tablet, take 1 tablet by mouth three times a day, Disp: 90 tablet, Rfl: 0 .  hydroxypropyl methylcellulose / hypromellose (ISOPTO TEARS / GONIOVISC) 2.5 % ophthalmic solution, Place 1 drop into both eyes 4 (four) times daily as needed for dry eyes., Disp: 15 mL, Rfl: 2 .   ketoconazole (NIZORAL) 2 % cream, Apply 1 application topically 2 (two) times daily. (Patient taking differently: Apply 1 application topically 2 (two) times daily as needed for irritation. ), Disp: 30 g, Rfl: 0 .  labetalol (NORMODYNE) 100 MG tablet, take 2 tablets by mouth twice a day, Disp: 120 tablet, Rfl: 0 .  metFORMIN (GLUCOPHAGE) 500 MG tablet, take 1 tablet by mouth once daily for diabetes (Patient taking differently: Take 500 mg by mouth daily with breakfast. ), Disp: 90 tablet, Rfl: 1 .  Multiple Vitamins-Minerals (ALIVE ONCE DAILY WOMENS PO), Take 15 mLs by mouth every other day. , Disp: , Rfl:  .  predniSONE (DELTASONE) 5 MG tablet, take 1 tablet by mouth once daily WITH BREAKFAST, Disp: 30 tablet, Rfl: 0 .  traMADol (ULTRAM) 50 MG tablet, Take 1-2 tablets every 6 hours as needed for pain, Disp: 240 tablet, Rfl: 5 .  triamcinolone ointment (KENALOG) 0.5 %, Apply 1 application 2 (two) times daily topically., Disp: 15 g, Rfl: 3 .  vitamin C (ASCORBIC ACID) 500 MG tablet, Take 500 mg by mouth every other day. , Disp: , Rfl:    Allergies  Allergen Reactions  . Clonidine Derivatives Swelling    [name]  . Fish Allergy Diarrhea, Swelling and Other (See Comments)    Turns skin "black,"  but can tolerate white fish Salmon- Diarrhea  . Shellfish Allergy Hives  . Doxycycline Rash  . Indomethacin     Reaction not recalled by the patient  . Lyrica [Pregabalin]     Hallucinations   . Methyldopa     Aldomet (for hypertension): Reaction not recalled by the patient  . Orange Fruit [Citrus] Other (See Comments)    Indigestion/heartburn  . Cetirizine Hcl Itching and Rash  . Codeine Itching  . Levaquin [Levofloxacin In D5w] Rash  . Tomato Rash   Review of Systems:   Pertinent items are noted in the HPI. Otherwise, ROS is negative.  Vitals:   Vitals:   10/12/17 1331  BP: (!) 150/82  Pulse: 72  Temp: 98.6 F (37  C)  TempSrc: Oral  SpO2: 96%  Weight: 185 lb 3.2 oz (84 kg)     Body mass index is 31.79 kg/m.   Physical Exam:   Physical Exam  Constitutional: She appears well-nourished.  HENT:  Head: Normocephalic and atraumatic.  Eyes: EOM are normal. Pupils are equal, round, and reactive to light.  Neck: Normal range of motion. Neck supple.  Cardiovascular: Normal rate, regular rhythm, normal heart sounds and intact distal pulses.  Pulmonary/Chest: Effort normal. Right breast exhibits no inverted nipple, no mass, no nipple discharge, no skin change and no tenderness. Left breast exhibits no inverted nipple, no mass, no nipple discharge, no skin change and no tenderness. Breasts are symmetrical.  Abdominal: Soft.  Musculoskeletal:       Arms: Right frozen shoulder. TTP right axilla, without noted skin changes.   Skin: Skin is warm.  Psychiatric: She has a normal mood and affect. Her behavior is normal.  Nursing note and vitals reviewed.   Results for orders placed or performed in visit on 09/13/17  Comp Met (CMET)  Result Value Ref Range   Sodium 137 135 - 145 mEq/L   Potassium 4.6 3.5 - 5.1 mEq/L   Chloride 103 96 - 112 mEq/L   CO2 24 19 - 32 mEq/L   Glucose, Bld 110 (H) 70 - 99 mg/dL   BUN 28 (H) 6 - 23 mg/dL   Creatinine, Ser 1.00 0.40 - 1.20 mg/dL   Total Bilirubin 0.5 0.2 - 1.2 mg/dL   Alkaline Phosphatase 46 39 - 117 U/L   AST 18 0 - 37 U/L   ALT 14 0 - 35 U/L   Total Protein 6.6 6.0 - 8.3 g/dL   Albumin 4.2 3.5 - 5.2 g/dL   Calcium 9.9 8.4 - 10.5 mg/dL   GFR 67.70 >60.00 mL/min  Uric acid  Result Value Ref Range   Uric Acid, Serum 5.4 2.4 - 7.0 mg/dL  Hemoglobin A1c  Result Value Ref Range   Hgb A1c MFr Bld 5.7 4.6 - 6.5 %   Assessment and Plan:   [name] [name] was seen today for cyst.  Diagnoses and all orders for this visit:  Adhesive capsulitis of right shoulder Comments: Therapy options reviewed with patient.  Will send to Dr. Paulla Fore. Orders: -     [place] LIMITED  JOINT SPACE STRUCTURES UP RIGHT(NO LINKED CHARGES) -     Synovial cell count + diff, w/ crystals  Acute pain of right shoulder with cyst Comments: Etiology of palpated area unclear.  Dr. regular sports medicine will see now for ultrasound.  I really appreciate his consult. Orders: -     [place] LIMITED JOINT SPACE STRUCTURES UP RIGHT(NO LINKED CHARGES)  . Reviewed expectations re: course of current medical  issues. . Discussed self-management of symptoms. . Outlined signs and symptoms indicating need for more acute intervention. . Patient verbalized understanding and all questions were answered. Marland Kitchen Health Maintenance issues including appropriate healthy diet, exercise, and smoking avoidance were discussed with patient. . See orders for this visit as documented in the electronic medical record. . Patient received an After Visit Summary.  Briscoe Deutscher, DO [place] [place] 10/12/2017  Future Appointments  Date Time Provider [place]  10/21/2017  9:00 AM Bo Merino, MD [facility] None  10/27/2017 11:15 AM Camelia Phenes, DPM [facility]  11/10/2017  1:20 PM Gerda Diss, DO [facility]  03/14/2018  1:00 PM Briscoe Deutscher, DO [facility]

## 2017-10-13 DIAGNOSIS — L97319 Non-pressure chronic ulcer of right ankle with unspecified severity: Secondary | ICD-10-CM | POA: Diagnosis not present

## 2017-10-13 DIAGNOSIS — M1990 Unspecified osteoarthritis, unspecified site: Secondary | ICD-10-CM | POA: Diagnosis not present

## 2017-10-13 DIAGNOSIS — I252 Old myocardial infarction: Secondary | ICD-10-CM | POA: Diagnosis not present

## 2017-10-13 DIAGNOSIS — I129 Hypertensive chronic kidney disease with stage 1 through stage 4 chronic kidney disease, or unspecified chronic kidney disease: Secondary | ICD-10-CM | POA: Diagnosis not present

## 2017-10-13 DIAGNOSIS — N183 Chronic kidney disease, stage 3 (moderate): Secondary | ICD-10-CM | POA: Diagnosis not present

## 2017-10-13 DIAGNOSIS — Z8673 Personal history of transient ischemic attack (TIA), and cerebral infarction without residual deficits: Secondary | ICD-10-CM | POA: Diagnosis not present

## 2017-10-13 DIAGNOSIS — L97819 Non-pressure chronic ulcer of other part of right lower leg with unspecified severity: Secondary | ICD-10-CM | POA: Diagnosis not present

## 2017-10-13 DIAGNOSIS — R569 Unspecified convulsions: Secondary | ICD-10-CM | POA: Diagnosis not present

## 2017-10-13 DIAGNOSIS — Z96653 Presence of artificial knee joint, bilateral: Secondary | ICD-10-CM | POA: Diagnosis not present

## 2017-10-13 DIAGNOSIS — M109 Gout, unspecified: Secondary | ICD-10-CM | POA: Diagnosis not present

## 2017-10-13 DIAGNOSIS — E1122 Type 2 diabetes mellitus with diabetic chronic kidney disease: Secondary | ICD-10-CM | POA: Diagnosis not present

## 2017-10-13 DIAGNOSIS — I2510 Atherosclerotic heart disease of native coronary artery without angina pectoris: Secondary | ICD-10-CM | POA: Diagnosis not present

## 2017-10-13 DIAGNOSIS — L97219 Non-pressure chronic ulcer of right calf with unspecified severity: Secondary | ICD-10-CM | POA: Diagnosis not present

## 2017-10-13 DIAGNOSIS — Z7982 Long term (current) use of aspirin: Secondary | ICD-10-CM | POA: Diagnosis not present

## 2017-10-13 DIAGNOSIS — Z7984 Long term (current) use of oral hypoglycemic drugs: Secondary | ICD-10-CM | POA: Diagnosis not present

## 2017-10-13 DIAGNOSIS — Z7952 Long term (current) use of systemic steroids: Secondary | ICD-10-CM | POA: Diagnosis not present

## 2017-10-13 DIAGNOSIS — E11622 Type 2 diabetes mellitus with other skin ulcer: Secondary | ICD-10-CM | POA: Diagnosis not present

## 2017-10-13 LAB — SYNOVIAL CELL COUNT + DIFF, W/ CRYSTALS
Basophils, %: 0 %
Eosinophils-Synovial: 0 % (ref 0–2)
Lymphocytes-Synovial Fld: 4 % (ref 0–74)
Monocyte/Macrophage: 11 % (ref 0–69)
Neutrophil, Synovial: 83 % — ABNORMAL HIGH (ref 0–24)
Synoviocytes, %: 2 % (ref 0–15)
WBC, Synovial: 494 cells/uL — ABNORMAL HIGH (ref <150)

## 2017-10-13 NOTE — Progress Notes (Signed)
Overall synovial fluid white blood cell count is less than 500 which is reassuring.  She does have an increased neutrophilia which does likely represent underlying inflammatory process which would be consistent with her rheumatoid arthritis. No further management at this time

## 2017-10-14 ENCOUNTER — Other Ambulatory Visit: Payer: Self-pay | Admitting: Family Medicine

## 2017-10-14 DIAGNOSIS — I10 Essential (primary) hypertension: Secondary | ICD-10-CM

## 2017-10-15 DIAGNOSIS — Z7984 Long term (current) use of oral hypoglycemic drugs: Secondary | ICD-10-CM | POA: Diagnosis not present

## 2017-10-15 DIAGNOSIS — E11622 Type 2 diabetes mellitus with other skin ulcer: Secondary | ICD-10-CM | POA: Diagnosis not present

## 2017-10-18 DIAGNOSIS — Z7984 Long term (current) use of oral hypoglycemic drugs: Secondary | ICD-10-CM | POA: Diagnosis not present

## 2017-10-18 DIAGNOSIS — E11622 Type 2 diabetes mellitus with other skin ulcer: Secondary | ICD-10-CM | POA: Diagnosis not present

## 2017-10-21 ENCOUNTER — Ambulatory Visit: Payer: Medicare Other | Admitting: Rheumatology

## 2017-10-21 DIAGNOSIS — E119 Type 2 diabetes mellitus without complications: Secondary | ICD-10-CM | POA: Diagnosis not present

## 2017-10-21 DIAGNOSIS — H35372 Puckering of macula, left eye: Secondary | ICD-10-CM | POA: Diagnosis not present

## 2017-10-22 DIAGNOSIS — L97319 Non-pressure chronic ulcer of right ankle with unspecified severity: Secondary | ICD-10-CM | POA: Diagnosis not present

## 2017-10-22 DIAGNOSIS — M109 Gout, unspecified: Secondary | ICD-10-CM | POA: Diagnosis not present

## 2017-10-22 DIAGNOSIS — I252 Old myocardial infarction: Secondary | ICD-10-CM | POA: Diagnosis not present

## 2017-10-22 DIAGNOSIS — Z7982 Long term (current) use of aspirin: Secondary | ICD-10-CM | POA: Diagnosis not present

## 2017-10-22 DIAGNOSIS — Z7952 Long term (current) use of systemic steroids: Secondary | ICD-10-CM | POA: Diagnosis not present

## 2017-10-22 DIAGNOSIS — Z96653 Presence of artificial knee joint, bilateral: Secondary | ICD-10-CM | POA: Diagnosis not present

## 2017-10-22 DIAGNOSIS — E11622 Type 2 diabetes mellitus with other skin ulcer: Secondary | ICD-10-CM | POA: Diagnosis not present

## 2017-10-22 DIAGNOSIS — L97219 Non-pressure chronic ulcer of right calf with unspecified severity: Secondary | ICD-10-CM | POA: Diagnosis not present

## 2017-10-22 DIAGNOSIS — I129 Hypertensive chronic kidney disease with stage 1 through stage 4 chronic kidney disease, or unspecified chronic kidney disease: Secondary | ICD-10-CM | POA: Diagnosis not present

## 2017-10-22 DIAGNOSIS — R569 Unspecified convulsions: Secondary | ICD-10-CM | POA: Diagnosis not present

## 2017-10-22 DIAGNOSIS — Z7984 Long term (current) use of oral hypoglycemic drugs: Secondary | ICD-10-CM | POA: Diagnosis not present

## 2017-10-22 DIAGNOSIS — M1990 Unspecified osteoarthritis, unspecified site: Secondary | ICD-10-CM | POA: Diagnosis not present

## 2017-10-22 DIAGNOSIS — L97819 Non-pressure chronic ulcer of other part of right lower leg with unspecified severity: Secondary | ICD-10-CM | POA: Diagnosis not present

## 2017-10-22 DIAGNOSIS — N183 Chronic kidney disease, stage 3 (moderate): Secondary | ICD-10-CM | POA: Diagnosis not present

## 2017-10-22 DIAGNOSIS — Z8673 Personal history of transient ischemic attack (TIA), and cerebral infarction without residual deficits: Secondary | ICD-10-CM | POA: Diagnosis not present

## 2017-10-22 DIAGNOSIS — E1122 Type 2 diabetes mellitus with diabetic chronic kidney disease: Secondary | ICD-10-CM | POA: Diagnosis not present

## 2017-10-22 DIAGNOSIS — I2510 Atherosclerotic heart disease of native coronary artery without angina pectoris: Secondary | ICD-10-CM | POA: Diagnosis not present

## 2017-10-26 ENCOUNTER — Encounter (HOSPITAL_BASED_OUTPATIENT_CLINIC_OR_DEPARTMENT_OTHER): Payer: Medicare Other | Attending: Surgery

## 2017-10-26 DIAGNOSIS — I2510 Atherosclerotic heart disease of native coronary artery without angina pectoris: Secondary | ICD-10-CM | POA: Insufficient documentation

## 2017-10-26 DIAGNOSIS — M0689 Other specified rheumatoid arthritis, multiple sites: Secondary | ICD-10-CM | POA: Insufficient documentation

## 2017-10-26 DIAGNOSIS — I129 Hypertensive chronic kidney disease with stage 1 through stage 4 chronic kidney disease, or unspecified chronic kidney disease: Secondary | ICD-10-CM | POA: Insufficient documentation

## 2017-10-26 DIAGNOSIS — Z7952 Long term (current) use of systemic steroids: Secondary | ICD-10-CM | POA: Insufficient documentation

## 2017-10-26 DIAGNOSIS — L97812 Non-pressure chronic ulcer of other part of right lower leg with fat layer exposed: Secondary | ICD-10-CM | POA: Diagnosis not present

## 2017-10-26 DIAGNOSIS — E11622 Type 2 diabetes mellitus with other skin ulcer: Secondary | ICD-10-CM | POA: Diagnosis not present

## 2017-10-26 DIAGNOSIS — M359 Systemic involvement of connective tissue, unspecified: Secondary | ICD-10-CM | POA: Diagnosis not present

## 2017-10-26 DIAGNOSIS — N183 Chronic kidney disease, stage 3 (moderate): Secondary | ICD-10-CM | POA: Insufficient documentation

## 2017-10-26 DIAGNOSIS — L97212 Non-pressure chronic ulcer of right calf with fat layer exposed: Secondary | ICD-10-CM | POA: Diagnosis not present

## 2017-10-27 ENCOUNTER — Other Ambulatory Visit: Payer: Self-pay | Admitting: Family Medicine

## 2017-10-27 ENCOUNTER — Other Ambulatory Visit: Payer: Self-pay | Admitting: Internal Medicine

## 2017-10-27 ENCOUNTER — Ambulatory Visit: Payer: Medicare Other | Admitting: Podiatry

## 2017-10-28 ENCOUNTER — Ambulatory Visit: Payer: Medicare Other | Admitting: Podiatry

## 2017-10-28 DIAGNOSIS — Z7952 Long term (current) use of systemic steroids: Secondary | ICD-10-CM | POA: Diagnosis not present

## 2017-10-28 DIAGNOSIS — L97219 Non-pressure chronic ulcer of right calf with unspecified severity: Secondary | ICD-10-CM | POA: Diagnosis not present

## 2017-10-28 DIAGNOSIS — L97819 Non-pressure chronic ulcer of other part of right lower leg with unspecified severity: Secondary | ICD-10-CM | POA: Diagnosis not present

## 2017-10-28 DIAGNOSIS — Z7984 Long term (current) use of oral hypoglycemic drugs: Secondary | ICD-10-CM | POA: Diagnosis not present

## 2017-10-28 DIAGNOSIS — Z96653 Presence of artificial knee joint, bilateral: Secondary | ICD-10-CM | POA: Diagnosis not present

## 2017-10-28 DIAGNOSIS — N183 Chronic kidney disease, stage 3 (moderate): Secondary | ICD-10-CM | POA: Diagnosis not present

## 2017-10-28 DIAGNOSIS — M1990 Unspecified osteoarthritis, unspecified site: Secondary | ICD-10-CM | POA: Diagnosis not present

## 2017-10-28 DIAGNOSIS — M109 Gout, unspecified: Secondary | ICD-10-CM | POA: Diagnosis not present

## 2017-10-28 DIAGNOSIS — Z8673 Personal history of transient ischemic attack (TIA), and cerebral infarction without residual deficits: Secondary | ICD-10-CM | POA: Diagnosis not present

## 2017-10-28 DIAGNOSIS — Z7982 Long term (current) use of aspirin: Secondary | ICD-10-CM | POA: Diagnosis not present

## 2017-10-28 DIAGNOSIS — E11622 Type 2 diabetes mellitus with other skin ulcer: Secondary | ICD-10-CM | POA: Diagnosis not present

## 2017-10-28 DIAGNOSIS — L97319 Non-pressure chronic ulcer of right ankle with unspecified severity: Secondary | ICD-10-CM | POA: Diagnosis not present

## 2017-10-28 DIAGNOSIS — R569 Unspecified convulsions: Secondary | ICD-10-CM | POA: Diagnosis not present

## 2017-10-28 DIAGNOSIS — E1122 Type 2 diabetes mellitus with diabetic chronic kidney disease: Secondary | ICD-10-CM | POA: Diagnosis not present

## 2017-10-28 DIAGNOSIS — I2510 Atherosclerotic heart disease of native coronary artery without angina pectoris: Secondary | ICD-10-CM | POA: Diagnosis not present

## 2017-10-28 DIAGNOSIS — I252 Old myocardial infarction: Secondary | ICD-10-CM | POA: Diagnosis not present

## 2017-10-28 DIAGNOSIS — I129 Hypertensive chronic kidney disease with stage 1 through stage 4 chronic kidney disease, or unspecified chronic kidney disease: Secondary | ICD-10-CM | POA: Diagnosis not present

## 2017-10-29 DIAGNOSIS — I252 Old myocardial infarction: Secondary | ICD-10-CM | POA: Diagnosis not present

## 2017-10-29 DIAGNOSIS — Z7952 Long term (current) use of systemic steroids: Secondary | ICD-10-CM | POA: Diagnosis not present

## 2017-10-29 DIAGNOSIS — Z8673 Personal history of transient ischemic attack (TIA), and cerebral infarction without residual deficits: Secondary | ICD-10-CM | POA: Diagnosis not present

## 2017-10-29 DIAGNOSIS — L97819 Non-pressure chronic ulcer of other part of right lower leg with unspecified severity: Secondary | ICD-10-CM | POA: Diagnosis not present

## 2017-10-29 DIAGNOSIS — N183 Chronic kidney disease, stage 3 (moderate): Secondary | ICD-10-CM | POA: Diagnosis not present

## 2017-10-29 DIAGNOSIS — L97219 Non-pressure chronic ulcer of right calf with unspecified severity: Secondary | ICD-10-CM | POA: Diagnosis not present

## 2017-10-29 DIAGNOSIS — R569 Unspecified convulsions: Secondary | ICD-10-CM | POA: Diagnosis not present

## 2017-10-29 DIAGNOSIS — E11622 Type 2 diabetes mellitus with other skin ulcer: Secondary | ICD-10-CM | POA: Diagnosis not present

## 2017-10-29 DIAGNOSIS — Z7982 Long term (current) use of aspirin: Secondary | ICD-10-CM | POA: Diagnosis not present

## 2017-10-29 DIAGNOSIS — E1122 Type 2 diabetes mellitus with diabetic chronic kidney disease: Secondary | ICD-10-CM | POA: Diagnosis not present

## 2017-10-29 DIAGNOSIS — Z7984 Long term (current) use of oral hypoglycemic drugs: Secondary | ICD-10-CM | POA: Diagnosis not present

## 2017-10-29 DIAGNOSIS — Z96653 Presence of artificial knee joint, bilateral: Secondary | ICD-10-CM | POA: Diagnosis not present

## 2017-10-29 DIAGNOSIS — M1990 Unspecified osteoarthritis, unspecified site: Secondary | ICD-10-CM | POA: Diagnosis not present

## 2017-10-29 DIAGNOSIS — I2510 Atherosclerotic heart disease of native coronary artery without angina pectoris: Secondary | ICD-10-CM | POA: Diagnosis not present

## 2017-10-29 DIAGNOSIS — M109 Gout, unspecified: Secondary | ICD-10-CM | POA: Diagnosis not present

## 2017-11-03 DIAGNOSIS — R260 Ataxic gait: Secondary | ICD-10-CM | POA: Diagnosis not present

## 2017-11-05 DIAGNOSIS — Z7982 Long term (current) use of aspirin: Secondary | ICD-10-CM | POA: Diagnosis not present

## 2017-11-05 DIAGNOSIS — I129 Hypertensive chronic kidney disease with stage 1 through stage 4 chronic kidney disease, or unspecified chronic kidney disease: Secondary | ICD-10-CM | POA: Diagnosis not present

## 2017-11-05 DIAGNOSIS — Z96653 Presence of artificial knee joint, bilateral: Secondary | ICD-10-CM | POA: Diagnosis not present

## 2017-11-05 DIAGNOSIS — L97219 Non-pressure chronic ulcer of right calf with unspecified severity: Secondary | ICD-10-CM | POA: Diagnosis not present

## 2017-11-05 DIAGNOSIS — M1990 Unspecified osteoarthritis, unspecified site: Secondary | ICD-10-CM | POA: Diagnosis not present

## 2017-11-05 DIAGNOSIS — Z7984 Long term (current) use of oral hypoglycemic drugs: Secondary | ICD-10-CM | POA: Diagnosis not present

## 2017-11-05 DIAGNOSIS — Z8673 Personal history of transient ischemic attack (TIA), and cerebral infarction without residual deficits: Secondary | ICD-10-CM | POA: Diagnosis not present

## 2017-11-05 DIAGNOSIS — I2510 Atherosclerotic heart disease of native coronary artery without angina pectoris: Secondary | ICD-10-CM | POA: Diagnosis not present

## 2017-11-05 DIAGNOSIS — M109 Gout, unspecified: Secondary | ICD-10-CM | POA: Diagnosis not present

## 2017-11-05 DIAGNOSIS — N183 Chronic kidney disease, stage 3 (moderate): Secondary | ICD-10-CM | POA: Diagnosis not present

## 2017-11-05 DIAGNOSIS — R569 Unspecified convulsions: Secondary | ICD-10-CM | POA: Diagnosis not present

## 2017-11-05 DIAGNOSIS — E1122 Type 2 diabetes mellitus with diabetic chronic kidney disease: Secondary | ICD-10-CM | POA: Diagnosis not present

## 2017-11-05 DIAGNOSIS — E11622 Type 2 diabetes mellitus with other skin ulcer: Secondary | ICD-10-CM | POA: Diagnosis not present

## 2017-11-05 DIAGNOSIS — L97819 Non-pressure chronic ulcer of other part of right lower leg with unspecified severity: Secondary | ICD-10-CM | POA: Diagnosis not present

## 2017-11-05 DIAGNOSIS — I252 Old myocardial infarction: Secondary | ICD-10-CM | POA: Diagnosis not present

## 2017-11-05 DIAGNOSIS — Z7952 Long term (current) use of systemic steroids: Secondary | ICD-10-CM | POA: Diagnosis not present

## 2017-11-05 DIAGNOSIS — L97319 Non-pressure chronic ulcer of right ankle with unspecified severity: Secondary | ICD-10-CM | POA: Diagnosis not present

## 2017-11-10 ENCOUNTER — Ambulatory Visit: Payer: Medicaid Other | Admitting: Sports Medicine

## 2017-11-10 DIAGNOSIS — I252 Old myocardial infarction: Secondary | ICD-10-CM | POA: Diagnosis not present

## 2017-11-10 DIAGNOSIS — Z96653 Presence of artificial knee joint, bilateral: Secondary | ICD-10-CM | POA: Diagnosis not present

## 2017-11-10 DIAGNOSIS — E1122 Type 2 diabetes mellitus with diabetic chronic kidney disease: Secondary | ICD-10-CM | POA: Diagnosis not present

## 2017-11-10 DIAGNOSIS — L97819 Non-pressure chronic ulcer of other part of right lower leg with unspecified severity: Secondary | ICD-10-CM | POA: Diagnosis not present

## 2017-11-10 DIAGNOSIS — M1990 Unspecified osteoarthritis, unspecified site: Secondary | ICD-10-CM | POA: Diagnosis not present

## 2017-11-10 DIAGNOSIS — L97319 Non-pressure chronic ulcer of right ankle with unspecified severity: Secondary | ICD-10-CM | POA: Diagnosis not present

## 2017-11-10 DIAGNOSIS — Z7984 Long term (current) use of oral hypoglycemic drugs: Secondary | ICD-10-CM | POA: Diagnosis not present

## 2017-11-10 DIAGNOSIS — I129 Hypertensive chronic kidney disease with stage 1 through stage 4 chronic kidney disease, or unspecified chronic kidney disease: Secondary | ICD-10-CM | POA: Diagnosis not present

## 2017-11-10 DIAGNOSIS — E11622 Type 2 diabetes mellitus with other skin ulcer: Secondary | ICD-10-CM | POA: Diagnosis not present

## 2017-11-10 DIAGNOSIS — M109 Gout, unspecified: Secondary | ICD-10-CM | POA: Diagnosis not present

## 2017-11-10 DIAGNOSIS — Z8673 Personal history of transient ischemic attack (TIA), and cerebral infarction without residual deficits: Secondary | ICD-10-CM | POA: Diagnosis not present

## 2017-11-10 DIAGNOSIS — Z7952 Long term (current) use of systemic steroids: Secondary | ICD-10-CM | POA: Diagnosis not present

## 2017-11-10 DIAGNOSIS — Z0289 Encounter for other administrative examinations: Secondary | ICD-10-CM

## 2017-11-10 DIAGNOSIS — I2510 Atherosclerotic heart disease of native coronary artery without angina pectoris: Secondary | ICD-10-CM | POA: Diagnosis not present

## 2017-11-10 DIAGNOSIS — Z7982 Long term (current) use of aspirin: Secondary | ICD-10-CM | POA: Diagnosis not present

## 2017-11-10 DIAGNOSIS — R569 Unspecified convulsions: Secondary | ICD-10-CM | POA: Diagnosis not present

## 2017-11-10 DIAGNOSIS — L97219 Non-pressure chronic ulcer of right calf with unspecified severity: Secondary | ICD-10-CM | POA: Diagnosis not present

## 2017-11-10 DIAGNOSIS — N183 Chronic kidney disease, stage 3 (moderate): Secondary | ICD-10-CM | POA: Diagnosis not present

## 2017-11-16 ENCOUNTER — Encounter (HOSPITAL_BASED_OUTPATIENT_CLINIC_OR_DEPARTMENT_OTHER): Payer: Medicare Other | Attending: Surgery

## 2017-11-16 DIAGNOSIS — N183 Chronic kidney disease, stage 3 (moderate): Secondary | ICD-10-CM | POA: Insufficient documentation

## 2017-11-16 DIAGNOSIS — E1122 Type 2 diabetes mellitus with diabetic chronic kidney disease: Secondary | ICD-10-CM | POA: Insufficient documentation

## 2017-11-16 DIAGNOSIS — I129 Hypertensive chronic kidney disease with stage 1 through stage 4 chronic kidney disease, or unspecified chronic kidney disease: Secondary | ICD-10-CM | POA: Insufficient documentation

## 2017-11-16 DIAGNOSIS — I2510 Atherosclerotic heart disease of native coronary artery without angina pectoris: Secondary | ICD-10-CM | POA: Insufficient documentation

## 2017-11-16 DIAGNOSIS — I10 Essential (primary) hypertension: Secondary | ICD-10-CM | POA: Diagnosis not present

## 2017-11-16 DIAGNOSIS — E11622 Type 2 diabetes mellitus with other skin ulcer: Secondary | ICD-10-CM | POA: Insufficient documentation

## 2017-11-16 DIAGNOSIS — M259 Joint disorder, unspecified: Secondary | ICD-10-CM | POA: Diagnosis not present

## 2017-11-16 DIAGNOSIS — L97812 Non-pressure chronic ulcer of other part of right lower leg with fat layer exposed: Secondary | ICD-10-CM | POA: Diagnosis not present

## 2017-11-16 DIAGNOSIS — M0689 Other specified rheumatoid arthritis, multiple sites: Secondary | ICD-10-CM | POA: Diagnosis not present

## 2017-11-16 DIAGNOSIS — M359 Systemic involvement of connective tissue, unspecified: Secondary | ICD-10-CM | POA: Diagnosis not present

## 2017-11-16 DIAGNOSIS — L88 Pyoderma gangrenosum: Secondary | ICD-10-CM | POA: Diagnosis not present

## 2017-11-16 DIAGNOSIS — L97212 Non-pressure chronic ulcer of right calf with fat layer exposed: Secondary | ICD-10-CM | POA: Diagnosis not present

## 2017-11-18 DIAGNOSIS — Z8673 Personal history of transient ischemic attack (TIA), and cerebral infarction without residual deficits: Secondary | ICD-10-CM | POA: Diagnosis not present

## 2017-11-18 DIAGNOSIS — I129 Hypertensive chronic kidney disease with stage 1 through stage 4 chronic kidney disease, or unspecified chronic kidney disease: Secondary | ICD-10-CM | POA: Diagnosis not present

## 2017-11-18 DIAGNOSIS — N183 Chronic kidney disease, stage 3 (moderate): Secondary | ICD-10-CM | POA: Diagnosis not present

## 2017-11-18 DIAGNOSIS — Z96653 Presence of artificial knee joint, bilateral: Secondary | ICD-10-CM | POA: Diagnosis not present

## 2017-11-18 DIAGNOSIS — I2510 Atherosclerotic heart disease of native coronary artery without angina pectoris: Secondary | ICD-10-CM | POA: Diagnosis not present

## 2017-11-18 DIAGNOSIS — Z7982 Long term (current) use of aspirin: Secondary | ICD-10-CM | POA: Diagnosis not present

## 2017-11-18 DIAGNOSIS — L97819 Non-pressure chronic ulcer of other part of right lower leg with unspecified severity: Secondary | ICD-10-CM | POA: Diagnosis not present

## 2017-11-18 DIAGNOSIS — L97319 Non-pressure chronic ulcer of right ankle with unspecified severity: Secondary | ICD-10-CM | POA: Diagnosis not present

## 2017-11-18 DIAGNOSIS — E11622 Type 2 diabetes mellitus with other skin ulcer: Secondary | ICD-10-CM | POA: Diagnosis not present

## 2017-11-18 DIAGNOSIS — L97219 Non-pressure chronic ulcer of right calf with unspecified severity: Secondary | ICD-10-CM | POA: Diagnosis not present

## 2017-11-18 DIAGNOSIS — E1122 Type 2 diabetes mellitus with diabetic chronic kidney disease: Secondary | ICD-10-CM | POA: Diagnosis not present

## 2017-11-18 DIAGNOSIS — R569 Unspecified convulsions: Secondary | ICD-10-CM | POA: Diagnosis not present

## 2017-11-18 DIAGNOSIS — M1990 Unspecified osteoarthritis, unspecified site: Secondary | ICD-10-CM | POA: Diagnosis not present

## 2017-11-18 DIAGNOSIS — Z7984 Long term (current) use of oral hypoglycemic drugs: Secondary | ICD-10-CM | POA: Diagnosis not present

## 2017-11-18 DIAGNOSIS — M109 Gout, unspecified: Secondary | ICD-10-CM | POA: Diagnosis not present

## 2017-11-18 DIAGNOSIS — I252 Old myocardial infarction: Secondary | ICD-10-CM | POA: Diagnosis not present

## 2017-11-18 DIAGNOSIS — Z7952 Long term (current) use of systemic steroids: Secondary | ICD-10-CM | POA: Diagnosis not present

## 2017-11-23 DIAGNOSIS — R569 Unspecified convulsions: Secondary | ICD-10-CM | POA: Diagnosis not present

## 2017-11-23 DIAGNOSIS — I2510 Atherosclerotic heart disease of native coronary artery without angina pectoris: Secondary | ICD-10-CM | POA: Diagnosis not present

## 2017-11-23 DIAGNOSIS — E1122 Type 2 diabetes mellitus with diabetic chronic kidney disease: Secondary | ICD-10-CM | POA: Diagnosis not present

## 2017-11-23 DIAGNOSIS — L97819 Non-pressure chronic ulcer of other part of right lower leg with unspecified severity: Secondary | ICD-10-CM | POA: Diagnosis not present

## 2017-11-23 DIAGNOSIS — Z7952 Long term (current) use of systemic steroids: Secondary | ICD-10-CM | POA: Diagnosis not present

## 2017-11-23 DIAGNOSIS — Z8673 Personal history of transient ischemic attack (TIA), and cerebral infarction without residual deficits: Secondary | ICD-10-CM | POA: Diagnosis not present

## 2017-11-23 DIAGNOSIS — I129 Hypertensive chronic kidney disease with stage 1 through stage 4 chronic kidney disease, or unspecified chronic kidney disease: Secondary | ICD-10-CM | POA: Diagnosis not present

## 2017-11-23 DIAGNOSIS — E11622 Type 2 diabetes mellitus with other skin ulcer: Secondary | ICD-10-CM | POA: Diagnosis not present

## 2017-11-23 DIAGNOSIS — N183 Chronic kidney disease, stage 3 (moderate): Secondary | ICD-10-CM | POA: Diagnosis not present

## 2017-11-23 DIAGNOSIS — Z96653 Presence of artificial knee joint, bilateral: Secondary | ICD-10-CM | POA: Diagnosis not present

## 2017-11-23 DIAGNOSIS — L97219 Non-pressure chronic ulcer of right calf with unspecified severity: Secondary | ICD-10-CM | POA: Diagnosis not present

## 2017-11-23 DIAGNOSIS — L97319 Non-pressure chronic ulcer of right ankle with unspecified severity: Secondary | ICD-10-CM | POA: Diagnosis not present

## 2017-11-23 DIAGNOSIS — M109 Gout, unspecified: Secondary | ICD-10-CM | POA: Diagnosis not present

## 2017-11-23 DIAGNOSIS — Z7984 Long term (current) use of oral hypoglycemic drugs: Secondary | ICD-10-CM | POA: Diagnosis not present

## 2017-11-23 DIAGNOSIS — Z7982 Long term (current) use of aspirin: Secondary | ICD-10-CM | POA: Diagnosis not present

## 2017-11-23 DIAGNOSIS — I252 Old myocardial infarction: Secondary | ICD-10-CM | POA: Diagnosis not present

## 2017-11-23 DIAGNOSIS — M1990 Unspecified osteoarthritis, unspecified site: Secondary | ICD-10-CM | POA: Diagnosis not present

## 2017-11-25 DIAGNOSIS — Z79899 Other long term (current) drug therapy: Secondary | ICD-10-CM | POA: Diagnosis not present

## 2017-11-25 DIAGNOSIS — M1990 Unspecified osteoarthritis, unspecified site: Secondary | ICD-10-CM | POA: Diagnosis not present

## 2017-11-25 DIAGNOSIS — Z5181 Encounter for therapeutic drug level monitoring: Secondary | ICD-10-CM | POA: Diagnosis not present

## 2017-11-25 DIAGNOSIS — M1A09X1 Idiopathic chronic gout, multiple sites, with tophus (tophi): Secondary | ICD-10-CM | POA: Diagnosis not present

## 2017-11-25 DIAGNOSIS — L98499 Non-pressure chronic ulcer of skin of other sites with unspecified severity: Secondary | ICD-10-CM | POA: Diagnosis not present

## 2017-11-25 DIAGNOSIS — M069 Rheumatoid arthritis, unspecified: Secondary | ICD-10-CM | POA: Diagnosis not present

## 2017-11-26 ENCOUNTER — Emergency Department (HOSPITAL_COMMUNITY): Payer: Medicare Other

## 2017-11-26 ENCOUNTER — Inpatient Hospital Stay (HOSPITAL_COMMUNITY)
Admission: EM | Admit: 2017-11-26 | Discharge: 2017-11-29 | DRG: 554 | Disposition: A | Discharge status: Home Health | Payer: Medicare Other | Attending: Family Medicine | Admitting: Family Medicine

## 2017-11-26 ENCOUNTER — Other Ambulatory Visit: Payer: Self-pay

## 2017-11-26 DIAGNOSIS — I252 Old myocardial infarction: Secondary | ICD-10-CM

## 2017-11-26 DIAGNOSIS — M797 Fibromyalgia: Secondary | ICD-10-CM | POA: Diagnosis present

## 2017-11-26 DIAGNOSIS — R6510 Systemic inflammatory response syndrome (SIRS) of non-infectious origin without acute organ dysfunction: Secondary | ICD-10-CM

## 2017-11-26 DIAGNOSIS — M109 Gout, unspecified: Secondary | ICD-10-CM | POA: Diagnosis not present

## 2017-11-26 DIAGNOSIS — M25521 Pain in right elbow: Secondary | ICD-10-CM | POA: Diagnosis not present

## 2017-11-26 DIAGNOSIS — J45909 Unspecified asthma, uncomplicated: Secondary | ICD-10-CM | POA: Diagnosis not present

## 2017-11-26 DIAGNOSIS — Z888 Allergy status to other drugs, medicaments and biological substances status: Secondary | ICD-10-CM

## 2017-11-26 DIAGNOSIS — M1A09X0 Idiopathic chronic gout, multiple sites, without tophus (tophi): Secondary | ICD-10-CM

## 2017-11-26 DIAGNOSIS — E1122 Type 2 diabetes mellitus with diabetic chronic kidney disease: Secondary | ICD-10-CM | POA: Diagnosis present

## 2017-11-26 DIAGNOSIS — I152 Hypertension secondary to endocrine disorders: Secondary | ICD-10-CM | POA: Diagnosis present

## 2017-11-26 DIAGNOSIS — I440 Atrioventricular block, first degree: Secondary | ICD-10-CM | POA: Diagnosis present

## 2017-11-26 DIAGNOSIS — Z96653 Presence of artificial knee joint, bilateral: Secondary | ICD-10-CM | POA: Diagnosis not present

## 2017-11-26 DIAGNOSIS — L0390 Cellulitis, unspecified: Secondary | ICD-10-CM

## 2017-11-26 DIAGNOSIS — L97929 Non-pressure chronic ulcer of unspecified part of left lower leg with unspecified severity: Secondary | ICD-10-CM | POA: Diagnosis present

## 2017-11-26 DIAGNOSIS — M069 Rheumatoid arthritis, unspecified: Secondary | ICD-10-CM | POA: Diagnosis not present

## 2017-11-26 DIAGNOSIS — M064 Inflammatory polyarthropathy: Secondary | ICD-10-CM | POA: Diagnosis not present

## 2017-11-26 DIAGNOSIS — M19012 Primary osteoarthritis, left shoulder: Secondary | ICD-10-CM | POA: Diagnosis not present

## 2017-11-26 DIAGNOSIS — G9349 Other encephalopathy: Secondary | ICD-10-CM | POA: Diagnosis present

## 2017-11-26 DIAGNOSIS — I130 Hypertensive heart and chronic kidney disease with heart failure and stage 1 through stage 4 chronic kidney disease, or unspecified chronic kidney disease: Secondary | ICD-10-CM | POA: Diagnosis present

## 2017-11-26 DIAGNOSIS — E1169 Type 2 diabetes mellitus with other specified complication: Secondary | ICD-10-CM | POA: Diagnosis present

## 2017-11-26 DIAGNOSIS — E785 Hyperlipidemia, unspecified: Secondary | ICD-10-CM | POA: Diagnosis present

## 2017-11-26 DIAGNOSIS — M19031 Primary osteoarthritis, right wrist: Secondary | ICD-10-CM | POA: Diagnosis not present

## 2017-11-26 DIAGNOSIS — Z7984 Long term (current) use of oral hypoglycemic drugs: Secondary | ICD-10-CM

## 2017-11-26 DIAGNOSIS — M7989 Other specified soft tissue disorders: Secondary | ICD-10-CM | POA: Diagnosis not present

## 2017-11-26 DIAGNOSIS — R000 Tachycardia, unspecified: Secondary | ICD-10-CM | POA: Diagnosis not present

## 2017-11-26 DIAGNOSIS — E1121 Type 2 diabetes mellitus with diabetic nephropathy: Secondary | ICD-10-CM | POA: Diagnosis present

## 2017-11-26 DIAGNOSIS — I639 Cerebral infarction, unspecified: Secondary | ICD-10-CM | POA: Diagnosis present

## 2017-11-26 DIAGNOSIS — Z8673 Personal history of transient ischemic attack (TIA), and cerebral infarction without residual deficits: Secondary | ICD-10-CM

## 2017-11-26 DIAGNOSIS — M79641 Pain in right hand: Secondary | ICD-10-CM | POA: Diagnosis not present

## 2017-11-26 DIAGNOSIS — R509 Fever, unspecified: Secondary | ICD-10-CM | POA: Diagnosis not present

## 2017-11-26 DIAGNOSIS — R52 Pain, unspecified: Secondary | ICD-10-CM | POA: Diagnosis not present

## 2017-11-26 DIAGNOSIS — I2510 Atherosclerotic heart disease of native coronary artery without angina pectoris: Secondary | ICD-10-CM | POA: Diagnosis not present

## 2017-11-26 DIAGNOSIS — N183 Chronic kidney disease, stage 3 unspecified: Secondary | ICD-10-CM | POA: Diagnosis present

## 2017-11-26 DIAGNOSIS — Z66 Do not resuscitate: Secondary | ICD-10-CM | POA: Diagnosis present

## 2017-11-26 DIAGNOSIS — E1159 Type 2 diabetes mellitus with other circulatory complications: Secondary | ICD-10-CM | POA: Diagnosis present

## 2017-11-26 DIAGNOSIS — M19011 Primary osteoarthritis, right shoulder: Secondary | ICD-10-CM | POA: Diagnosis not present

## 2017-11-26 DIAGNOSIS — M25422 Effusion, left elbow: Secondary | ICD-10-CM | POA: Diagnosis not present

## 2017-11-26 DIAGNOSIS — Z91013 Allergy to seafood: Secondary | ICD-10-CM

## 2017-11-26 DIAGNOSIS — M25461 Effusion, right knee: Secondary | ICD-10-CM | POA: Diagnosis not present

## 2017-11-26 DIAGNOSIS — M19042 Primary osteoarthritis, left hand: Secondary | ICD-10-CM | POA: Diagnosis not present

## 2017-11-26 DIAGNOSIS — I5042 Chronic combined systolic (congestive) and diastolic (congestive) heart failure: Secondary | ICD-10-CM | POA: Diagnosis present

## 2017-11-26 DIAGNOSIS — E1151 Type 2 diabetes mellitus with diabetic peripheral angiopathy without gangrene: Secondary | ICD-10-CM | POA: Diagnosis present

## 2017-11-26 DIAGNOSIS — L97919 Non-pressure chronic ulcer of unspecified part of right lower leg with unspecified severity: Secondary | ICD-10-CM | POA: Diagnosis present

## 2017-11-26 DIAGNOSIS — E11622 Type 2 diabetes mellitus with other skin ulcer: Secondary | ICD-10-CM | POA: Diagnosis not present

## 2017-11-26 DIAGNOSIS — Z7952 Long term (current) use of systemic steroids: Secondary | ICD-10-CM

## 2017-11-26 DIAGNOSIS — E876 Hypokalemia: Secondary | ICD-10-CM | POA: Diagnosis not present

## 2017-11-26 DIAGNOSIS — Z91018 Allergy to other foods: Secondary | ICD-10-CM

## 2017-11-26 DIAGNOSIS — I739 Peripheral vascular disease, unspecified: Secondary | ICD-10-CM | POA: Diagnosis present

## 2017-11-26 DIAGNOSIS — I5032 Chronic diastolic (congestive) heart failure: Secondary | ICD-10-CM | POA: Diagnosis present

## 2017-11-26 DIAGNOSIS — I10 Essential (primary) hypertension: Secondary | ICD-10-CM

## 2017-11-26 DIAGNOSIS — R1084 Generalized abdominal pain: Secondary | ICD-10-CM | POA: Diagnosis not present

## 2017-11-26 DIAGNOSIS — E1129 Type 2 diabetes mellitus with other diabetic kidney complication: Secondary | ICD-10-CM | POA: Diagnosis present

## 2017-11-26 DIAGNOSIS — M19032 Primary osteoarthritis, left wrist: Secondary | ICD-10-CM | POA: Diagnosis not present

## 2017-11-26 DIAGNOSIS — D649 Anemia, unspecified: Secondary | ICD-10-CM | POA: Diagnosis present

## 2017-11-26 DIAGNOSIS — Z881 Allergy status to other antibiotic agents status: Secondary | ICD-10-CM

## 2017-11-26 DIAGNOSIS — Z7982 Long term (current) use of aspirin: Secondary | ICD-10-CM

## 2017-11-26 LAB — URINALYSIS, ROUTINE W REFLEX MICROSCOPIC
Bilirubin Urine: NEGATIVE
Glucose, UA: NEGATIVE mg/dL
Hgb urine dipstick: NEGATIVE
Ketones, ur: NEGATIVE mg/dL
Leukocytes, UA: NEGATIVE
Nitrite: NEGATIVE
Protein, ur: NEGATIVE mg/dL
Specific Gravity, Urine: 1.01 (ref 1.005–1.030)
pH: 8 (ref 5.0–8.0)

## 2017-11-26 LAB — COMPREHENSIVE METABOLIC PANEL
ALT: 13 U/L — ABNORMAL LOW (ref 14–54)
AST: 21 U/L (ref 15–41)
Albumin: 3.4 g/dL — ABNORMAL LOW (ref 3.5–5.0)
Alkaline Phosphatase: 54 U/L (ref 38–126)
Anion gap: 9 (ref 5–15)
BUN: 21 mg/dL — ABNORMAL HIGH (ref 6–20)
CO2: 24 mmol/L (ref 22–32)
Calcium: 9.2 mg/dL (ref 8.9–10.3)
Chloride: 102 mmol/L (ref 101–111)
Creatinine, Ser: 0.76 mg/dL (ref 0.44–1.00)
GFR calc Af Amer: >60 mL/min (ref >60)
GFR calc non Af Amer: >60 mL/min (ref >60)
Glucose, Bld: 105 mg/dL — ABNORMAL HIGH (ref 65–99)
Potassium: 2.9 mmol/L — ABNORMAL LOW (ref 3.5–5.1)
Sodium: 135 mmol/L (ref 135–145)
Total Bilirubin: 0.9 mg/dL (ref 0.3–1.2)
Total Protein: 6.8 g/dL (ref 6.5–8.1)

## 2017-11-26 LAB — INFLUENZA PANEL BY PCR (TYPE A & B)
Influenza A By PCR: NEGATIVE
Influenza B By PCR: NEGATIVE

## 2017-11-26 LAB — I-STAT CG4 LACTIC ACID, ED
Lactic Acid, Venous: 1.5 mmol/L (ref 0.5–1.9)
Lactic Acid, Venous: 1.23 mmol/L (ref 0.5–1.9)

## 2017-11-26 LAB — CBC WITH DIFFERENTIAL/PLATELET
Basophils Absolute: 0 10*3/uL (ref 0.0–0.1)
Basophils Relative: 0 %
Eosinophils Absolute: 0.1 10*3/uL (ref 0.0–0.7)
Eosinophils Relative: 1 %
HCT: 30.9 % — ABNORMAL LOW (ref 36.0–46.0)
Hemoglobin: 10.5 g/dL — ABNORMAL LOW (ref 12.0–15.0)
Lymphocytes Relative: 14 %
Lymphs Abs: 1.8 10*3/uL (ref 0.7–4.0)
MCH: 33.1 pg (ref 26.0–34.0)
MCHC: 34 g/dL (ref 30.0–36.0)
MCV: 97.5 fL (ref 78.0–100.0)
Monocytes Absolute: 1.1 10*3/uL — ABNORMAL HIGH (ref 0.1–1.0)
Monocytes Relative: 8 %
Neutro Abs: 10.5 10*3/uL — ABNORMAL HIGH (ref 1.7–7.7)
Neutrophils Relative %: 77 %
Platelets: 319 10*3/uL (ref 150–400)
RBC: 3.17 MIL/uL — ABNORMAL LOW (ref 3.87–5.11)
RDW: 15.7 % — ABNORMAL HIGH (ref 11.5–15.5)
WBC: 13.5 10*3/uL — ABNORMAL HIGH (ref 4.0–10.5)

## 2017-11-26 LAB — SYNOVIAL CELL COUNT + DIFF, W/ CRYSTALS
Crystals, Fluid: NONE SEEN
Eosinophils-Synovial: 0 % (ref 0–1)
Lymphocytes-Synovial Fld: 6 % (ref 0–20)
Monocyte-Macrophage-Synovial Fluid: 2 % — ABNORMAL LOW (ref 50–90)
Neutrophil, Synovial: 92 % — ABNORMAL HIGH (ref 0–25)
WBC, Synovial: UNDETERMINED /mm3 (ref 0–200)

## 2017-11-26 LAB — MAGNESIUM: Magnesium: 1.2 mg/dL — ABNORMAL LOW (ref 1.7–2.4)

## 2017-11-26 LAB — SEDIMENTATION RATE: Sed Rate: 32 mm/hr — ABNORMAL HIGH (ref 0–22)

## 2017-11-26 LAB — URIC ACID: Uric Acid, Serum: 4.6 mg/dL (ref 2.3–6.6)

## 2017-11-26 MED ORDER — SODIUM CHLORIDE 0.9 % IV BOLUS (SEPSIS)
1000.0000 mL | Freq: Once | INTRAVENOUS | Status: AC
  Administered 2017-11-26: 1000 mL via INTRAVENOUS

## 2017-11-26 MED ORDER — ENOXAPARIN SODIUM 40 MG/0.4ML ~~LOC~~ SOLN
40.0000 mg | SUBCUTANEOUS | Status: DC
  Administered 2017-11-26 – 2017-11-28 (×3): 40 mg via SUBCUTANEOUS
  Filled 2017-11-26 (×3): qty 0.4

## 2017-11-26 MED ORDER — DEXTROSE 5 % IV SOLN
2.0000 g | Freq: Two times a day (BID) | INTRAVENOUS | Status: DC
  Administered 2017-11-26 – 2017-11-27 (×2): 2 g via INTRAVENOUS
  Filled 2017-11-26 (×3): qty 2

## 2017-11-26 MED ORDER — PREDNISONE 20 MG PO TABS
40.0000 mg | ORAL_TABLET | Freq: Every day | ORAL | Status: DC
  Administered 2017-11-27 – 2017-11-29 (×3): 40 mg via ORAL
  Filled 2017-11-26 (×3): qty 2

## 2017-11-26 MED ORDER — POTASSIUM CHLORIDE CRYS ER 20 MEQ PO TBCR
40.0000 meq | EXTENDED_RELEASE_TABLET | Freq: Once | ORAL | Status: AC
  Administered 2017-11-26: 40 meq via ORAL
  Filled 2017-11-26: qty 2

## 2017-11-26 MED ORDER — VANCOMYCIN HCL IN DEXTROSE 750-5 MG/150ML-% IV SOLN
750.0000 mg | INTRAVENOUS | Status: DC
  Administered 2017-11-26 – 2017-11-28 (×2): 750 mg via INTRAVENOUS
  Filled 2017-11-26 (×3): qty 150

## 2017-11-26 MED ORDER — FERROUS SULFATE 325 (65 FE) MG PO TABS
325.0000 mg | ORAL_TABLET | Freq: Every day | ORAL | Status: DC
  Administered 2017-11-26 – 2017-11-29 (×4): 325 mg via ORAL
  Filled 2017-11-26 (×4): qty 1

## 2017-11-26 MED ORDER — VANCOMYCIN HCL IN DEXTROSE 1-5 GM/200ML-% IV SOLN: 1000.0000 mg | INTRAVENOUS | Status: DC

## 2017-11-26 MED ORDER — POLYVINYL ALCOHOL 1.4 % OP SOLN
1.0000 [drp] | Freq: Four times a day (QID) | OPHTHALMIC | Status: DC | PRN
  Filled 2017-11-26: qty 15

## 2017-11-26 MED ORDER — HYPROMELLOSE (GONIOSCOPIC) 2.5 % OP SOLN: 1.0000 [drp] | Freq: Four times a day (QID) | OPHTHALMIC | Status: DC | PRN

## 2017-11-26 MED ORDER — ALBUTEROL SULFATE (2.5 MG/3ML) 0.083% IN NEBU: 2.5000 mg | INHALATION_SOLUTION | Freq: Four times a day (QID) | RESPIRATORY_TRACT | Status: DC | PRN

## 2017-11-26 MED ORDER — MAGNESIUM SULFATE 50 % IJ SOLN: 6.0000 g | Freq: Once | INTRAVENOUS | Status: DC

## 2017-11-26 MED ORDER — ASPIRIN EC 81 MG PO TBEC
81.0000 mg | DELAYED_RELEASE_TABLET | Freq: Every day | ORAL | Status: DC
  Administered 2017-11-26 – 2017-11-29 (×4): 81 mg via ORAL
  Filled 2017-11-26 (×4): qty 1

## 2017-11-26 MED ORDER — PIPERACILLIN-TAZOBACTAM 3.375 G IVPB 30 MIN
3.3750 g | Freq: Once | INTRAVENOUS | Status: AC
  Administered 2017-11-26: 3.375 g via INTRAVENOUS
  Filled 2017-11-26: qty 50

## 2017-11-26 MED ORDER — POLYETHYLENE GLYCOL 3350 17 G PO PACK: 17.0000 g | PACK | Freq: Every day | ORAL | Status: DC | PRN

## 2017-11-26 MED ORDER — ALLOPURINOL 300 MG PO TABS
150.0000 mg | ORAL_TABLET | Freq: Every evening | ORAL | Status: DC
  Administered 2017-11-27 – 2017-11-28 (×2): 150 mg via ORAL
  Filled 2017-11-26 (×2): qty 1

## 2017-11-26 MED ORDER — METHYLPREDNISOLONE SODIUM SUCC 125 MG IJ SOLR: 60.0000 mg | Freq: Once | INTRAMUSCULAR | Status: DC

## 2017-11-26 MED ORDER — FENTANYL CITRATE (PF) 100 MCG/2ML IJ SOLN
50.0000 ug | Freq: Once | INTRAMUSCULAR | Status: DC
Start: 2017-11-26 — End: 2017-11-26
  Filled 2017-11-26: qty 2

## 2017-11-26 MED ORDER — METHYLPREDNISOLONE SODIUM SUCC 40 MG IJ SOLR: 40.0000 mg | Freq: Once | INTRAMUSCULAR | Status: DC

## 2017-11-26 MED ORDER — METHYLPREDNISOLONE SODIUM SUCC 125 MG IJ SOLR
60.0000 mg | Freq: Once | INTRAMUSCULAR | Status: AC
  Administered 2017-11-26: 60 mg via INTRAVENOUS
  Filled 2017-11-26: qty 2

## 2017-11-26 MED ORDER — ACETAMINOPHEN 500 MG PO TABS
1000.0000 mg | ORAL_TABLET | Freq: Three times a day (TID) | ORAL | Status: DC
  Administered 2017-11-26 – 2017-11-29 (×7): 1000 mg via ORAL
  Filled 2017-11-26 (×7): qty 2

## 2017-11-26 MED ORDER — METRONIDAZOLE 500 MG PO TABS
500.0000 mg | ORAL_TABLET | Freq: Three times a day (TID) | ORAL | Status: DC
  Administered 2017-11-26 – 2017-11-27 (×3): 500 mg via ORAL
  Filled 2017-11-26 (×3): qty 1

## 2017-11-26 MED ORDER — MORPHINE BOLUS VIA INFUSION: 1.0000 mg | Freq: Once | INTRAVENOUS | Status: DC

## 2017-11-26 MED ORDER — CALCIUM CARBONATE-VITAMIN D 500-200 MG-UNIT PO TABS
1.0000 | ORAL_TABLET | Freq: Every day | ORAL | Status: DC
  Administered 2017-11-26 – 2017-11-29 (×4): 1 via ORAL
  Filled 2017-11-26 (×4): qty 1

## 2017-11-26 MED ORDER — PREDNISONE 5 MG PO TABS
7.5000 mg | ORAL_TABLET | Freq: Once | ORAL | Status: AC
  Administered 2017-11-26: 7.5 mg via ORAL
  Filled 2017-11-26: qty 1

## 2017-11-26 MED ORDER — INSULIN ASPART 100 UNIT/ML ~~LOC~~ SOLN
0.0000 [IU] | Freq: Every day | SUBCUTANEOUS | Status: DC
  Administered 2017-11-27: 2 [IU] via SUBCUTANEOUS

## 2017-11-26 MED ORDER — MAGNESIUM SULFATE 4 GM/100ML IV SOLN
4.0000 g | Freq: Once | INTRAVENOUS | Status: AC
  Administered 2017-11-26: 4 g via INTRAVENOUS
  Filled 2017-11-26: qty 100

## 2017-11-26 MED ORDER — VANCOMYCIN HCL IN DEXTROSE 1-5 GM/200ML-% IV SOLN
1000.0000 mg | Freq: Once | INTRAVENOUS | Status: AC
  Administered 2017-11-26: 1000 mg via INTRAVENOUS
  Filled 2017-11-26: qty 200

## 2017-11-26 MED ORDER — ALLOPURINOL 100 MG PO TABS: 100.0000 mg | ORAL_TABLET | Freq: Every evening | ORAL | Status: DC

## 2017-11-26 MED ORDER — ALBUTEROL SULFATE HFA 108 (90 BASE) MCG/ACT IN AERS: 1.0000 | INHALATION_SPRAY | Freq: Four times a day (QID) | RESPIRATORY_TRACT | Status: DC | PRN

## 2017-11-26 MED ORDER — OXYCODONE HCL 5 MG PO TABS
5.0000 mg | ORAL_TABLET | Freq: Four times a day (QID) | ORAL | Status: DC | PRN
  Administered 2017-11-26: 5 mg via ORAL
  Filled 2017-11-26: qty 1

## 2017-11-26 MED ORDER — VITAMIN C 500 MG PO TABS
500.0000 mg | ORAL_TABLET | ORAL | Status: DC
  Administered 2017-11-27 – 2017-11-29 (×2): 500 mg via ORAL
  Filled 2017-11-26 (×3): qty 1

## 2017-11-26 MED ORDER — GABAPENTIN 100 MG PO CAPS
100.0000 mg | ORAL_CAPSULE | Freq: Every day | ORAL | Status: DC
  Administered 2017-11-26 – 2017-11-28 (×3): 100 mg via ORAL
  Filled 2017-11-26 (×3): qty 1

## 2017-11-26 MED ORDER — FENTANYL CITRATE (PF) 100 MCG/2ML IJ SOLN
50.0000 ug | Freq: Once | INTRAMUSCULAR | Status: AC
  Administered 2017-11-26: 50 ug via INTRAVENOUS
  Filled 2017-11-26: qty 2

## 2017-11-26 MED ORDER — FOLIC ACID 1 MG PO TABS
1.0000 mg | ORAL_TABLET | Freq: Every day | ORAL | Status: DC
  Administered 2017-11-26 – 2017-11-29 (×4): 1 mg via ORAL
  Filled 2017-11-26 (×4): qty 1

## 2017-11-26 MED ORDER — HYDRALAZINE HCL 50 MG PO TABS
100.0000 mg | ORAL_TABLET | Freq: Three times a day (TID) | ORAL | Status: DC
  Administered 2017-11-26 – 2017-11-29 (×7): 100 mg via ORAL
  Filled 2017-11-26 (×7): qty 2

## 2017-11-26 MED ORDER — PREDNISONE 50 MG PO TABS: 50.0000 mg | ORAL_TABLET | Freq: Every day | ORAL | Status: DC

## 2017-11-26 MED ORDER — MAGNESIUM SULFATE 2 GM/50ML IV SOLN
2.0000 g | Freq: Once | INTRAVENOUS | Status: AC
  Administered 2017-11-27: 2 g via INTRAVENOUS
  Filled 2017-11-26: qty 50

## 2017-11-26 MED ORDER — MORPHINE SULFATE (PF) 4 MG/ML IV SOLN: 1.0000 mg | INTRAVENOUS | Status: DC | PRN

## 2017-11-26 MED ORDER — LABETALOL HCL 200 MG PO TABS
200.0000 mg | ORAL_TABLET | Freq: Two times a day (BID) | ORAL | Status: DC
  Administered 2017-11-26 – 2017-11-29 (×6): 200 mg via ORAL
  Filled 2017-11-26 (×6): qty 1

## 2017-11-26 MED ORDER — INSULIN ASPART 100 UNIT/ML ~~LOC~~ SOLN
0.0000 [IU] | Freq: Three times a day (TID) | SUBCUTANEOUS | Status: DC
  Administered 2017-11-27: 2 [IU] via SUBCUTANEOUS
  Administered 2017-11-27: 3 [IU] via SUBCUTANEOUS
  Administered 2017-11-28: 1 [IU] via SUBCUTANEOUS
  Administered 2017-11-28: 2 [IU] via SUBCUTANEOUS
  Administered 2017-11-29: 1 [IU] via SUBCUTANEOUS

## 2017-11-26 MED ORDER — ACETAMINOPHEN 325 MG PO TABS
650.0000 mg | ORAL_TABLET | Freq: Once | ORAL | Status: AC
  Administered 2017-11-26: 650 mg via ORAL
  Filled 2017-11-26: qty 2

## 2017-11-26 MED ORDER — MORPHINE SULFATE (PF) 2 MG/ML IV SOLN
1.0000 mg | Freq: Once | INTRAVENOUS | Status: AC
  Administered 2017-11-26: 1 mg via INTRAVENOUS
  Filled 2017-11-26: qty 1

## 2017-11-26 NOTE — Progress Notes (Signed)
I asked the micro lab to obtain a prelim WBC due to clotted specimen and the result was roughly 12,000 which is consistent with inflammatory arthritis such as RA.  Will also cultures to see if it grows anything. May treat as RA flare up for now.  Steroids ok.

## 2017-11-26 NOTE — Consult Note (Signed)
ORTHOPAEDIC CONSULTATION  REQUESTING PHYSICIAN: Elodia Florence., *  Chief Complaint: Left elbow pain  HPI: [name] [name] is a 84 y.o. female who presents with severe bilateral shoulder, elbow, wrist and hand pain.  Patient has h/o gout and RA on MTX and prednisone.  She's not on any biologics.  Patient had a fever of 101.5.  Denies any other f/c/n/v.  She endorses severe left elbow pain especially with swelling.  Ortho consulted to evaluate for left elbow pain r/o septic joint vs other etiologies.  Past Medical History:  Diagnosis Date  . Allergic rhinitis due to pollen 04/27/2007  . Arthritis    "in q joint" (08/07/2013)  . Coronary atherosclerosis of native coronary artery   . Cough   . Disorder of bone and cartilage, unspecified   . Edema 05/03/2013  . Exertional shortness of breath    "sometimes" (08/07/2013)  . First degree atrioventricular block   . Gout, unspecified 04/19/2013  . Hyperlipidemia 04/27/2007  . Hypertension   . Muscle spasm   . Muscle weakness (generalized)   . Myocardial infarction ([place]) ~ 1970  . Onychia and paronychia of toe   . Osteoarthrosis, unspecified whether generalized or localized, unspecified site 04/27/2007  . Other fall   . Other malaise and fatigue   . Prepatellar bursitis   . Seizures ([place])   . Stroke ([place]) 01/17/2014  . TIA (transient ischemic attack)    "a few one summer" (08/07/2013)  . Type II diabetes mellitus ([place])    "fasting 90-110s" (08/07/2013)  . Unspecified essential hypertension 08/08/2013  . Unspecified vitamin D deficiency    Past Surgical History:  Procedure Laterality Date  . ABDOMINAL HYSTERECTOMY  04/1980  . APPENDECTOMY  1960  . CARDIAC CATHETERIZATION  2003  . CATARACT EXTRACTION W/ INTRAOCULAR LENS  IMPLANT, BILATERAL Bilateral ~ 2012  . JOINT REPLACEMENT    . REPLACEMENT TOTAL KNEE Right 09/2005  . SHOULDER OPEN ROTATOR CUFF REPAIR Right 07/1999  . TONSILLECTOMY  08/1974  . TOTAL KNEE ARTHROPLASTY  Left 08/07/2013   Procedure: TOTAL KNEE ARTHROPLASTY- LEFT;  Surgeon: Vickey Huger, MD;  Location: [place];  Service: Orthopedics;  Laterality: Left;  . TRANSTHORACIC ECHOCARDIOGRAM  2003   EF 55-65%; mild concentric LVH   Social History   Socioeconomic History  . Marital status: Widowed    Spouse name: Not on file  . Number of children: Not on file  . Years of education: Not on file  . Highest education level: Not on file  Social Needs  . Financial resource strain: Not on file  . Food insecurity - worry: Not on file  . Food insecurity - inability: Not on file  . Transportation needs - medical: Not on file  . Transportation needs - non-medical: Not on file  Occupational History  . Not on file  Tobacco Use  . Smoking status: Never Smoker  . Smokeless tobacco: Never Used  Substance and Sexual Activity  . Alcohol use: No    Alcohol/week: 0.0 oz  . Drug use: No  . Sexual activity: No  Other Topics Concern  . Not on file  Social History Narrative   Widowed   Walks with cane   Family History  Problem Relation Age of Onset  . Heart attack Father    - negative except otherwise stated in the family history section Allergies  Allergen Reactions  . Clonidine Derivatives Swelling    [name]  .  Fish Allergy Diarrhea, Swelling and Other (See Comments)    Turns skin "black," but can tolerate white fish Salmon- Diarrhea  . Shellfish Allergy Hives  . Doxycycline Rash  . Indomethacin     Reaction not recalled by the patient  . Lyrica [Pregabalin]     Hallucinations   . Methyldopa     Aldomet (for hypertension): Reaction not recalled by the patient  . Orange Fruit [Citrus] Other (See Comments)    Indigestion/heartburn  . Cetirizine Hcl Itching and Rash  . Codeine Itching  . Levaquin [Levofloxacin In D5w] Rash  . Tomato Rash   Prior to Admission medications   Medication Sig Start Date End Date Taking?  Authorizing Provider  albuterol (PROVENTIL HFA;VENTOLIN HFA) 108 (90 Base) MCG/ACT inhaler Inhale 1 puff into the lungs every 6 (six) hours as needed for wheezing or shortness of breath.   Yes [provider]  allopurinol (ZYLOPRIM) 100 MG tablet take 1 tablet by mouth once daily for GOUT Patient taking differently: Take 100 mg by mouth every evening.  03/11/17  Yes Briscoe Deutscher, DO  aspirin EC 81 MG tablet Take 81 mg by mouth daily.   Yes [provider]  calcium-vitamin D (OSCAL WITH D) 500-200 MG-UNIT per tablet Take 1 tablet by mouth daily.    Yes [provider]  CHLOROPHYLL PO Take 5 drops by mouth See admin instructions. MIX INTO 6 OUNCES OF JUICE AND DRINK ONCE A DAY   Yes [provider]  ferrous sulfate 325 (65 FE) MG tablet Take 325 mg by mouth daily.    Yes [provider]  Fluticasone-Salmeterol (ADVAIR DISKUS) 250-50 MCG/DOSE AEPB Inhale 1 puff into the lungs 2 (two) times daily. 05/03/13  Yes [provider]  folic acid (FOLVITE) 1 MG tablet Take 1 tablet (1 mg total) by mouth daily. 09/21/16 12/15/17 Yes Panwala, Naitik, PA-C  furosemide (LASIX) 20 MG tablet Take 1 tablet (20 mg total) by mouth every other day. 05/27/17  Yes Nita Sells, MD  gabapentin (NEURONTIN) 100 MG capsule Take 1 capsule (100 mg total) at bedtime by mouth. 09/13/17  Yes Briscoe Deutscher, DO  hydrALAZINE (APRESOLINE) 100 MG tablet take 1 tablet by mouth three times a day 10/14/17  Yes Briscoe Deutscher, DO  hydroxypropyl methylcellulose / hypromellose (ISOPTO TEARS / GONIOVISC) 2.5 % ophthalmic solution Place 1 drop into both eyes 4 (four) times daily as needed for dry eyes. 07/07/17  Yes Briscoe Deutscher, DO  labetalol (NORMODYNE) 100 MG tablet take 2 tablets by mouth twice a day 10/27/17  Yes Briscoe Deutscher, DO  metFORMIN (GLUCOPHAGE) 500 MG tablet take 1 tablet by mouth once daily for diabetes Patient taking differently: Take 500 mg by mouth daily with  breakfast.  03/11/17  Yes Briscoe Deutscher, DO  methotrexate (RHEUMATREX) 2.5 MG tablet Take 10 mg by mouth once a week.  11/26/17  Yes [provider]  Multiple Vitamins-Minerals (ALIVE ONCE DAILY WOMENS PO) Take 15 mLs by mouth every other day.    Yes [provider]  oxycodone (OXY-IR) 5 MG capsule Take 5 mg by mouth every 4 (four) hours as needed for pain.   Yes [provider]  predniSONE (DELTASONE) 5 MG tablet take 1 tablet by mouth once daily WITH BREAKFAST 08/18/17  Yes Deveshwar, Abel Presto, MD  SANTYL ointment Apply 1 application topically daily. AS DIRECTED 10/27/17  Yes [provider]  traMADol (ULTRAM) 50 MG tablet Take 1-2 tablets every 6 hours as needed for pain 09/13/17  Yes Briscoe Deutscher, DO  triamcinolone ointment (KENALOG) 0.5 % Apply 1 application 2 (two) times daily topically. 09/13/17  Yes Briscoe Deutscher, DO  vitamin C (ASCORBIC ACID) 500 MG tablet Take 500 mg by mouth every other day.    Yes [provider]  blood glucose meter kit and supplies KIT Dispense based on patient and insurance preference. Use up to four times daily as directed. (FOR ICD-9 250.00, 250.01). 07/30/17   Briscoe Deutscher, DO  ketoconazole (NIZORAL) 2 % cream Apply 1 application topically 2 (two) times daily. Patient not taking: Reported on 11/26/2017 12/03/16   Robyn Haber, MD   Dg Chest 2 View  Result Date: 11/26/2017 CLINICAL DATA:  Fever.  Generalized body pain.  Left arm pain. EXAM: CHEST  2 VIEW COMPARISON:  05/25/2017 FINDINGS: The cardiac silhouette remains moderately enlarged. Thoracic aortic tortuosity is similar to the prior study. There is minimal central pulmonary vascular congestion without evidence of overt edema, airspace consolidation, pleural effusion, or pneumothorax. Advanced degenerative changes are again seen about both shoulders, with postsurgical changes noted on the right. IMPRESSION: Chronic cardiomegaly without evidence of acute abnormality.  Electronically Signed   By: Logan Bores [name]   On: 11/26/2017 15:11   Dg Shoulder 1v Left  Result Date: 11/26/2017 CLINICAL DATA:  Severe shoulder pain EXAM: LEFT SHOULDER - 1 VIEW COMPARISON:  None. FINDINGS: No fracture or dislocation. Marked degenerative change at the [facility] joint. Narrowed subacromial space suggesting rotator cuff disease. Erosions at the distal clavicle. Flattening and sclerosis within the humeral head. IMPRESSION: 1. Advanced arthritis at the glenohumeral interval and AC joint 2. Erosive change at the distal end of the clavicle, can be seen with rheumatoid. 3. Narrowed subacromial space suggesting rotator cuff pathology. No fracture or dislocation. Electronically Signed   By: Donavan Foil [name]   On: 11/26/2017 19:20   Dg Shoulder Right  Result Date: 11/26/2017 CLINICAL DATA:  Severe shoulder pain EXAM: RIGHT SHOULDER - 2+ VIEW COMPARISON:  None. FINDINGS: Tapering of the distal clavicle likely due to prior surgery. Fixating screws within the acromion. Surgical anchor in the right humeral head. Advanced arthritis of the glenohumeral interval with narrowing, and sclerosis. Flattened appearance of the humeral head with prominent spurring. IMPRESSION: 1. Postsurgical changes of the acromion and humeral head. Suspect prior distal resection of right clavicle 2. No acute fracture or malalignment 3. Severe arthritis of the right glenohumeral interval Electronically Signed   By: Donavan Foil [name]   On: 11/26/2017 19:27   Dg Elbow 2 Views Left  Result Date: 11/26/2017 CLINICAL DATA:  Rheumatoid with severe pain EXAM: LEFT ELBOW - 2 VIEW COMPARISON:  None. FINDINGS: Limited by positioning. No dislocation. Age indeterminate deformity of the radial head. Soft tissue swelling is present. IMPRESSION: Age indeterminate deformity of the radial head. Soft tissue swelling. Electronically Signed   By: Donavan Foil [name]   On: 11/26/2017 19:18   Dg Elbow 2 Views Right  Result Date:  11/26/2017 CLINICAL DATA:  Severe pain EXAM: RIGHT ELBOW - 2 VIEW COMPARISON:  None. FINDINGS: No fracture or malalignment.  No large elbow effusion. IMPRESSION: No acute osseous abnormality. Electronically Signed   By: Donavan Foil [name]   On: 11/26/2017 19:18   Dg Wrist 2 Views Left  Result Date: 11/26/2017 CLINICAL DATA:  Severe wrist pain EXAM: LEFT WRIST - 2 VIEW COMPARISON:  None. FINDINGS: Diffuse osteopenia. No fracture or dislocation is evident. Severe intercarpal arthritic changes and arthritis at the [facility] joints. Multifocal erosions  at the base of the third and fourth metacarpals. Narrowing of the distal radial carpal joint. Os or old fracture adjacent to the ulnar styloid. Chronic deformity of the scaphoid bone with widening of the scaffold lunate interval and proximal migration of the capitate bone. Scattered erosions within the carpal bones along the radial aspect of the wrist. IMPRESSION: 1. Osteopenia with multifocal bony erosions consistent with inflammatory arthropathy. 2. SLAC configuration of the carpal bones, which may also be seen in the setting of rheumatoid arthritis. 3. No fracture is seen. Electronically Signed   By: Donavan Foil [name]   On: 11/26/2017 19:09   Dg Wrist 2 Views Right  Result Date: 11/26/2017 CLINICAL DATA:  Arthritis, wrist pain EXAM: RIGHT WRIST - 2 VIEW COMPARISON:  12/18/2011 FINDINGS: Osteopenia. No acute displaced fracture. Large lucent lesion in the distal radius. Multiple erosions involving base of fourth and fifth metacarpals, and the carpal bones along the ulnar aspect of the wrist. Chronic deformity of the scaphoid. Widened scapholunate interval with proximal migration of the capitate. Marked arthropathy at the radiocarpal interval. Large amount of soft tissue swelling. Mild volar subluxation of the carpal bones with respect to the distal radius on lateral view. IMPRESSION: 1. Large amount of soft tissue swelling at the wrist. No obvious fracture is seen.  Mild volar subluxation of carpal bones with respect to the distal radius on lateral view 2. Multiple bony erosions involving the carpal bones and metacarpals consistent with inflammatory arthritis. SLAC configuration of the wrist which may also be seen in the setting of rheumatoid. Large lucent lesion in the distal radius, may reflect large subchondral cyst. Electronically Signed   By: Donavan Foil [name]   On: 11/26/2017 19:16   Dg Tibia/fibula Right  Result Date: 11/26/2017 CLINICAL DATA:  Open wound to the lower leg EXAM: RIGHT TIBIA AND FIBULA - 2 VIEW COMPARISON:  05/25/2017 FINDINGS: No fracture or malalignment is seen. Soft tissue swelling. No soft tissue emphysema. Extensive vascular calcifications. Probable dense vascular calcification anterior to the distal tibia. Plantar calcaneal spur. Right knee replacement with normal alignment. Small suprapatellar effusion IMPRESSION: 1. No acute osseous abnormality.  Negative for soft tissue emphysema 2. Right knee replacement.  Small suprapatellar effusion Electronically Signed   By: Donavan Foil [name]   On: 11/26/2017 18:58   Dg Hand 2 View Right  Result Date: 11/26/2017 CLINICAL DATA:  Severe hand pain history of rheumatoid EXAM: RIGHT HAND - 2 VIEW COMPARISON:  None. FINDINGS: Diffuse osteopenia. No fracture is seen. Erosions at the base of the fourth and fifth metacarpals. Soft tissue swelling is present. Flexion at the second through fifth PIP joints. IMPRESSION: 1. No acute osseous abnormality 2. Diffuse osteopenia and bony erosive changes at the base of the metacarpals consistent with erosive/inflammatory arthropathy Electronically Signed   By: Donavan Foil [name]   On: 11/26/2017 19:03   Dg Hand Complete Left  Result Date: 11/26/2017 CLINICAL DATA:  Severe hand pain history of rheumatoid EXAM: LEFT HAND - COMPLETE 3+ VIEW COMPARISON:  None. FINDINGS: Distal third digit is partially obscured by monitoring device. Diffuse osteopenia. No acute  fracture or malalignment is seen. Erosive changes at the head of the first metacarpal. Advanced arthritis at the [facility] joints. Probable erosive changes at the base of the fourth metacarpal. Suspected erosion base of first proximal phalanx. IMPRESSION: Diffuse osteopenia with erosive changes suspected at the base of fourth metacarpal and head of first metacarpal and base of first proximal phalanx, consistent with erosive arthropathy.  Marked arthritis at the [facility] joints. Electronically Signed   By: Donavan Foil [name]   On: 11/26/2017 19:01   - pertinent xrays, CT, MRI studies were reviewed and independently interpreted  Positive ROS: All other systems have been reviewed and were otherwise negative with the exception of those mentioned in the HPI and as above.  Physical Exam: General: Alert, no acute distress Cardiovascular: No pedal edema Respiratory: No cyanosis, no use of accessory musculature GI: No organomegaly, abdomen is soft and non-tender Skin: No lesions in the area of chief complaint Neurologic: Sensation intact distally Psychiatric: Patient is competent for consent with normal mood and affect Lymphatic: No axillary or cervical lymphadenopathy  MUSCULOSKELETAL:  - elbow effusion with significant pain with PROM - NVI distally - no cellulitis - slightly warm to touch  Assessment: Left elbow effusion  Plan: - xrays show DJD - RA flare up vs. Gout vs. Less likely septic joint - elbow aspirated with return of blood tinged inflammatory appearing fluid w/o purulence - synovial fluid sent to lab  Thank you for the consult and the opportunity to see [name] [name] Eduard Roux, MD [facility] [phone] 7:56 PM

## 2017-11-26 NOTE — ED Notes (Signed)
Bed: WA02 Expected date:  Expected time:  Means of arrival:  Comments: Held for hall A

## 2017-11-26 NOTE — Progress Notes (Addendum)
Pharmacy Antibiotic Note  [name] [name] is a 84 y.o. female admitted on 11/26/2017 with wound infection.  Pharmacy has been consulted for vancomycin and cefepime dosing.  Patient received vanc 1g and zosyn 3.375g x 1 in ED today.  Plan: Vancomycin 750mg  IV q24h for AUC goal 400-500. Cefepime 2g IV q12h.     Temp (24hrs), Avg:100.6 F (38.1 C), Min:99.7 F (37.6 C), Max:101.5 F (38.6 C)  Recent Labs  Lab 11/26/17 1444 11/26/17 1503  WBC 13.5*  --   CREATININE 0.76  --   LATICACIDVEN  --  1.50    CrCl cannot be calculated (Unknown ideal weight.).    Allergies  Allergen Reactions  . Clonidine Derivatives Swelling    [name]  . Fish Allergy Diarrhea, Swelling and Other (See Comments)    Turns skin "black," but can tolerate white fish Salmon- Diarrhea  . Shellfish Allergy Hives  . Doxycycline Rash  . Indomethacin     Reaction not recalled by the patient  . Lyrica [Pregabalin]     Hallucinations   . Methyldopa     Aldomet (for hypertension): Reaction not recalled by the patient  . Orange Fruit [Citrus] Other (See Comments)    Indigestion/heartburn  . Cetirizine Hcl Itching and Rash  . Codeine Itching  . Levaquin [Levofloxacin In D5w] Rash  . Tomato Rash    Antimicrobials this admission: 1/18 Zosyn x 1 1/18 Cefepime >> 1/18 Vancomycin >>  Dose adjustments this admission:   Microbiology results: 1/18 BCx: 1/18 UCx:  Thank you for allowing pharmacy to be a part of this patient's care.  2/18 11/26/2017 6:12 PM

## 2017-11-26 NOTE — ED Provider Notes (Signed)
[facility] DEPT Provider Note   CSN: [id number] Arrival date & time: 11/26/17  1250     History   Chief Complaint Chief Complaint  Patient presents with  . generalized pain    HPI [name] [name] is a 84 y.o. female.  The history is provided by the patient and a relative. No language interpreter was used.    [name] [name] is a 84 y.o. female who presents to the Emergency Department complaining of pain.  She presents via EMS for generalized pain that began this morning.  She has a history of rheumatoid arthritis as well as osteoarthritis and gout has chronic joint pain and deformity.  She had a routine visit with her rheumatologist yesterday with no medication adjustments at that time.  This morning when she awoke she reports generalized severe pain in the majority of her joints, greatest in the right shoulder, elbow, hand.  She also endorses feeling poorly in general and is unable to move without pain.  She took her home pain meds with no significant improvement in her symptoms.  She did not take her prednisone this morning.  No reports of fever, cough, chest pain, nausea, vomiting, abdominal pain, dysuria.  Symptoms are severe, constant, worsening.  Past Medical History:  Diagnosis Date  . Allergic rhinitis due to pollen 04/27/2007  . Arthritis    "in q joint" (08/07/2013)  . Coronary atherosclerosis of native coronary artery   . Cough   . Disorder of bone and cartilage, unspecified   . Edema 05/03/2013  . Exertional shortness of breath    "sometimes" (08/07/2013)  . First degree atrioventricular block   . Gout, unspecified 04/19/2013  . Hyperlipidemia 04/27/2007  . Hypertension   . Muscle spasm   . Muscle weakness (generalized)   . Myocardial infarction ([place]) ~ 1970  . Onychia and paronychia of toe   . Osteoarthrosis, unspecified whether generalized or localized, unspecified site 04/27/2007  . Other fall   . Other malaise and fatigue   .  Prepatellar bursitis   . Seizures ([place])   . Stroke ([place]) 01/17/2014  . TIA (transient ischemic attack)    "a few one summer" (08/07/2013)  . Type II diabetes mellitus ([place])    "fasting 90-110s" (08/07/2013)  . Unspecified essential hypertension 08/08/2013  . Unspecified vitamin D deficiency     Patient Active Problem List   Diagnosis Date Noted  . Acute pain of right shoulder 10/12/2017  . Adhesive capsulitis of right shoulder 10/12/2017  . Rheumatic nodule 06/28/2017  . History of CHF (congestive heart failure) 02/18/2017  . History of diabetes mellitus 02/18/2017  . History of total knee arthroplasty, bilateral 02/18/2017  . History of rotator cuff surgery 02/18/2017  . Obesity (BMI 30.0-34.9) 12/08/2016  . High risk medications (not anticoagulants) long-term use 09/21/2016  . Hammer toe 10/31/2015  . Acute on chronic diastolic heart failure ([place]) 09/12/2015  . Onychomycosis 04/24/2015  . Midline low back pain without sciatica 06/27/2014  . Bilateral edema of lower extremity 06/05/2014  . CKD (chronic kidney disease) stage 3, GFR 30-59 ml/min ([facility]) 06/05/2014  . DM (diabetes mellitus), type 2 with renal complications ([place]) 56/31/4970    Class: Chronic  . Anemia of chronic disease 05/01/2014  . Fluctuating blood pressure 02/06/2014  . Stroke ([place]) 01/17/2014  . Rheumatoid arthritis ([place]) 12/19/2013    Class: Chronic  . CAD (coronary artery disease) 10/18/2013  . HTN (hypertension) 08/08/2013    Class: Chronic  . Fibromyalgia 05/10/2013  .  Reactive airway disease 04/19/2013  . Gout 04/19/2013  . Hyperlipidemia 04/27/2007  . Allergic rhinitis 04/27/2007  . Diverticulosis 04/27/2007    Past Surgical History:  Procedure Laterality Date  . ABDOMINAL HYSTERECTOMY  04/1980  . APPENDECTOMY  1960  . CARDIAC CATHETERIZATION  2003  . CATARACT EXTRACTION W/ INTRAOCULAR LENS  IMPLANT, BILATERAL Bilateral ~ 2012  . JOINT REPLACEMENT    . REPLACEMENT TOTAL KNEE Right 09/2005  .  SHOULDER OPEN ROTATOR CUFF REPAIR Right 07/1999  . TONSILLECTOMY  08/1974  . TOTAL KNEE ARTHROPLASTY Left 08/07/2013   Procedure: TOTAL KNEE ARTHROPLASTY- LEFT;  Surgeon: Vickey Huger, MD;  Location: [facility];  Service: Orthopedics;  Laterality: Left;  . TRANSTHORACIC ECHOCARDIOGRAM  2003   EF 55-65%; mild concentric LVH    OB History    No data available       Home Medications    Prior to Admission medications   Medication Sig Start Date End Date Taking? Authorizing Provider  albuterol (PROVENTIL HFA;VENTOLIN HFA) 108 (90 Base) MCG/ACT inhaler Inhale 1 puff into the lungs every 6 (six) hours as needed for wheezing or shortness of breath.   Yes [provider]  allopurinol (ZYLOPRIM) 100 MG tablet take 1 tablet by mouth once daily for GOUT Patient taking differently: Take 100 mg by mouth every evening.  03/11/17  Yes Briscoe Deutscher, DO  aspirin EC 81 MG tablet Take 81 mg by mouth daily.   Yes [provider]  calcium-vitamin D (OSCAL WITH D) 500-200 MG-UNIT per tablet Take 1 tablet by mouth daily.    Yes [provider]  CHLOROPHYLL PO Take 5 drops by mouth See admin instructions. MIX INTO 6 OUNCES OF JUICE AND DRINK ONCE A DAY   Yes [provider]  ferrous sulfate 325 (65 FE) MG tablet Take 325 mg by mouth daily.    Yes [provider]  Fluticasone-Salmeterol (ADVAIR DISKUS) 250-50 MCG/DOSE AEPB Inhale 1 puff into the lungs 2 (two) times daily. 05/03/13  Yes [provider]  folic acid (FOLVITE) 1 MG tablet Take 1 tablet (1 mg total) by mouth daily. 09/21/16 12/15/17 Yes Panwala, Naitik, PA-C  furosemide (LASIX) 20 MG tablet Take 1 tablet (20 mg total) by mouth every other day. 05/27/17  Yes Nita Sells, MD  gabapentin (NEURONTIN) 100 MG capsule Take 1 capsule (100 mg total) at bedtime by mouth. 09/13/17  Yes Briscoe Deutscher, DO  hydrALAZINE (APRESOLINE) 100 MG tablet take 1 tablet by mouth three times a day 10/14/17  Yes Briscoe Deutscher,  DO  hydroxypropyl methylcellulose / hypromellose (ISOPTO TEARS / GONIOVISC) 2.5 % ophthalmic solution Place 1 drop into both eyes 4 (four) times daily as needed for dry eyes. 07/07/17  Yes Briscoe Deutscher, DO  labetalol (NORMODYNE) 100 MG tablet take 2 tablets by mouth twice a day 10/27/17  Yes Briscoe Deutscher, DO  metFORMIN (GLUCOPHAGE) 500 MG tablet take 1 tablet by mouth once daily for diabetes Patient taking differently: Take 500 mg by mouth daily with breakfast.  03/11/17  Yes Briscoe Deutscher, DO  methotrexate (RHEUMATREX) 2.5 MG tablet Take 10 mg by mouth once a week.  11/26/17  Yes [provider]  Multiple Vitamins-Minerals (ALIVE ONCE DAILY WOMENS PO) Take 15 mLs by mouth every other day.    Yes [provider]  oxycodone (OXY-IR) 5 MG capsule Take 5 mg by mouth every 4 (four) hours as needed for pain.   Yes [provider]  predniSONE (DELTASONE) 5 MG tablet take 1  tablet by mouth once daily WITH BREAKFAST 08/18/17  Yes Deveshwar, Abel Presto, MD  SANTYL ointment Apply 1 application topically daily. AS DIRECTED 10/27/17  Yes [provider]  traMADol (ULTRAM) 50 MG tablet Take 1-2 tablets every 6 hours as needed for pain 09/13/17  Yes Briscoe Deutscher, DO  triamcinolone ointment (KENALOG) 0.5 % Apply 1 application 2 (two) times daily topically. 09/13/17  Yes Briscoe Deutscher, DO  vitamin C (ASCORBIC ACID) 500 MG tablet Take 500 mg by mouth every other day.    Yes [provider]  blood glucose meter kit and supplies KIT Dispense based on patient and insurance preference. Use up to four times daily as directed. (FOR ICD-9 250.00, 250.01). 07/30/17   Briscoe Deutscher, DO  ketoconazole (NIZORAL) 2 % cream Apply 1 application topically 2 (two) times daily. Patient not taking: Reported on 11/26/2017 12/03/16   Robyn Haber, MD    Family History Family History  Problem Relation Age of Onset  . Heart attack Father     Social History Social History   Tobacco Use   . Smoking status: Never Smoker  . Smokeless tobacco: Never Used  Substance Use Topics  . Alcohol use: No    Alcohol/week: 0.0 oz  . Drug use: No     Allergies   Clonidine derivatives; Fish allergy; Shellfish allergy; Doxycycline; Indomethacin; Lyrica [pregabalin]; Methyldopa; Orange fruit [citrus]; Cetirizine hcl; Codeine; Levaquin [levofloxacin in d5w]; and Tomato   Review of Systems Review of Systems  All other systems reviewed and are negative.    Physical Exam Updated Vital Signs BP (!) 184/73   Pulse (!) 109   Temp (!) 101.5 F (38.6 C) (Rectal)   Resp (!) 22   SpO2 98%   Physical Exam  Constitutional: She is oriented to person, place, and time. She appears well-developed and well-nourished.  Uncomfortable appearing  HENT:  Head: Normocephalic and atraumatic.  Cardiovascular: Regular rhythm.  No murmur heard. Tachycardic  Pulmonary/Chest: Effort normal. No respiratory distress.  Occasional rhonchi in the left lung base  Abdominal: Soft. There is no tenderness. There is no rebound and no guarding.  Musculoskeletal: She exhibits no tenderness.  Deformities to the bilateral upper extremities, greatest in the right hand and wrist.  2 deep ulcerations to the distal right lower extremity with foul-smelling drainage and mild to moderate surrounding erythema.  Neurological: She is alert and oriented to person, place, and time.  Skin: Skin is warm and dry.  Psychiatric: She has a normal mood and affect. Her behavior is normal.  Nursing note and vitals reviewed.    ED Treatments / Results  Labs (all labs ordered are listed, but only abnormal results are displayed) Labs Reviewed  COMPREHENSIVE METABOLIC PANEL - Abnormal; Notable for the following components:      Result Value   Potassium 2.9 (*)    Glucose, Bld 105 (*)    BUN 21 (*)    Albumin 3.4 (*)    ALT 13 (*)    All other components within normal limits  CBC WITH DIFFERENTIAL/PLATELET - Abnormal; Notable  for the following components:   WBC 13.5 (*)    RBC 3.17 (*)    Hemoglobin 10.5 (*)    HCT 30.9 (*)    RDW 15.7 (*)    Neutro Abs 10.5 (*)    Monocytes Absolute 1.1 (*)    All other components within normal limits  CULTURE, BLOOD (ROUTINE X 2)  CULTURE, BLOOD (ROUTINE X 2)  URINE CULTURE  URINALYSIS,  ROUTINE W REFLEX MICROSCOPIC  INFLUENZA PANEL BY PCR (TYPE A & B)  I-STAT CG4 LACTIC ACID, ED  I-STAT CG4 LACTIC ACID, ED    EKG  EKG Interpretation  Date/Time:  Friday November 26 2017 14:39:38 EST Ventricular Rate:  101 PR Interval:    QRS Duration: 88 QT Interval:  368 QTC Calculation: 477 R Axis:   -17 Text Interpretation:  Sinus tachycardia LVH by voltage Confirmed by Quintella Reichert [phone]) on 11/26/2017 2:43:18 PM       Radiology Dg Chest 2 View  Result Date: 11/26/2017 CLINICAL DATA:  Fever.  Generalized body pain.  Left arm pain. EXAM: CHEST  2 VIEW COMPARISON:  05/25/2017 FINDINGS: The cardiac silhouette remains moderately enlarged. Thoracic aortic tortuosity is similar to the prior study. There is minimal central pulmonary vascular congestion without evidence of overt edema, airspace consolidation, pleural effusion, or pneumothorax. Advanced degenerative changes are again seen about both shoulders, with postsurgical changes noted on the right. IMPRESSION: Chronic cardiomegaly without evidence of acute abnormality. Electronically Signed   By: Logan Bores [name]   On: 11/26/2017 15:11    Procedures Procedures (including critical care time)  Medications Ordered in ED Medications  vancomycin (VANCOCIN) IVPB 1000 mg/200 mL premix (not administered)  predniSONE (DELTASONE) tablet 7.5 mg (not administered)  potassium chloride SA (K-DUR,KLOR-CON) CR tablet 40 mEq (not administered)  piperacillin-tazobactam (ZOSYN) IVPB 3.375 g (3.375 g Intravenous New Bag/Given 11/26/17 1531)  sodium chloride 0.9 % bolus 1,000 mL (1,000 mLs Intravenous New Bag/Given 11/26/17 1531)    acetaminophen (TYLENOL) tablet 650 mg (650 mg Oral Given 11/26/17 1532)  fentaNYL (SUBLIMAZE) injection 50 mcg (50 mcg Intravenous Given 11/26/17 1532)     Initial Impression / Assessment and Plan / ED Course  I have reviewed the triage vital signs and the nursing notes.  Pertinent labs & imaging results that were available during my care of the patient were reviewed by me and considered in my medical decision making (see chart for details).     Patient here for body aches that started today.  She is chronically ill-appearing on examination and uncomfortable appearing.  She does have significant arthritic changes to her joints.  There is a wound to the right leg with considerable foul-smelling drainage concern for possible developing infection.  She was started on IV antibiotics.  Hospitalist consulted for admission for further treatment.  Final Clinical Impressions(s) / ED Diagnoses   Final diagnoses:  None    ED Discharge Orders    None       Quintella Reichert, MD 11/26/17 [phone]

## 2017-11-26 NOTE — ED Triage Notes (Signed)
Pt arrived via EMS from home. Pt is c/o of generalized pain. Pt reports that she has RA, and pain has exacerbated this am. Pt was referred to come in to the ED by PCP to be evaluated. Pt is alert and oriented x 4. Pt lives with dtr.    EMS 143/73 HR 110 RR 18, 98% RA, CBG 100

## 2017-11-26 NOTE — ED Notes (Signed)
Pt has open wounds to the rt lower leg with purulent yellow drainage.

## 2017-11-26 NOTE — Progress Notes (Signed)
A consult was received from an ED physician for vancomycin and zosyn per pharmacy dosing.  The patient's profile has been reviewed for ht/wt/allergies/indication/available labs.    A one time order has been placed for vancomycin 1000 mg IV x1 and zosyn 3.375 gm IV x1 over 30 min (ordered by MD).  Further antibiotics/pharmacy consults should be ordered by admitting physician if indicated.                       Thank you, Lucia Gaskins 11/26/2017  2:23 PM

## 2017-11-26 NOTE — ED Notes (Signed)
Bed: [facility] Expected date:  Expected time:  Means of arrival:  Comments: 84 yo pain all over

## 2017-11-26 NOTE — ED Notes (Signed)
ED TO INPATIENT HANDOFF REPORT  Name/Age/Gender [name] [name] 84 y.o. female  Code Status Code Status History    Date Active Date Inactive Code Status Order ID Comments User Context   05/25/2017 18:11 05/27/2017 19:56 DNR [id number]  Caren Griffins, MD Inpatient   11/29/2016 15:22 12/01/2016 15:09 Full Code [id number]  Elwin Mocha, MD ED   09/11/2015 04:54 09/12/2015 14:32 Full Code [id number]  Etta Quill, DO ED   06/19/2015 23:38 06/21/2015 19:41 Full Code [id number]  Etta Quill, DO ED   01/25/2014 22:45 06/19/2015 23:38 Full Code [id number]  Hennie Duos, MD Outpatient   01/17/2014 13:05 01/22/2014 21:42 Full Code [id number]  Allie Bossier, MD ED    Questions for Most Recent Historical Code Status (Order [id number])    Question Answer Comment   In the event of cardiac or respiratory ARREST Do not call a "code blue"    In the event of cardiac or respiratory ARREST Do not perform Intubation, CPR, defibrillation or ACLS    In the event of cardiac or respiratory ARREST Use medication by any route, position, wound care, and other measures to relive pain and suffering. May use oxygen, suction and manual treatment of airway obstruction as needed for comfort.       Home/SNF/Other Home  Chief Complaint generalized pain  Level of Care/Admitting Diagnosis ED Disposition    ED Disposition Condition [facility] Area: [place] [[id number]]  Level of Care: Telemetry [5]  Admit to tele based on following criteria: Other see comments  Comments: sepsis  Diagnosis: [facility]) [[id number]]  Admitting Physician: Elodia Florence [phone]  Attending Physician: Cephus Slater, [name] [phone]  Estimated length of stay: past midnight tomorrow  Certification:: I certify this patient will need inpatient services for at least 2 midnights  PT Class (Do Not Modify): Inpatient [101]  PT Acc Code (Do Not Modify): Private [1]       Medical  History Past Medical History:  Diagnosis Date  . Allergic rhinitis due to pollen 04/27/2007  . Arthritis    "in q joint" (08/07/2013)  . Coronary atherosclerosis of native coronary artery   . Cough   . Disorder of bone and cartilage, unspecified   . Edema 05/03/2013  . Exertional shortness of breath    "sometimes" (08/07/2013)  . First degree atrioventricular block   . Gout, unspecified 04/19/2013  . Hyperlipidemia 04/27/2007  . Hypertension   . Muscle spasm   . Muscle weakness (generalized)   . Myocardial infarction ([place]) ~ 1970  . Onychia and paronychia of toe   . Osteoarthrosis, unspecified whether generalized or localized, unspecified site 04/27/2007  . Other fall   . Other malaise and fatigue   . Prepatellar bursitis   . Seizures ([place])   . Stroke ([place]) 01/17/2014  . TIA (transient ischemic attack)    "a few one summer" (08/07/2013)  . Type II diabetes mellitus ([place])    "fasting 90-110s" (08/07/2013)  . Unspecified essential hypertension 08/08/2013  . Unspecified vitamin D deficiency     Allergies Allergies  Allergen Reactions  . Clonidine Derivatives Swelling    [name]  . Fish Allergy Diarrhea, Swelling and Other (See Comments)    Turns skin "black," but can tolerate white fish Salmon- Diarrhea  . Shellfish Allergy Hives  . Doxycycline Rash  . Indomethacin     Reaction not recalled by the patient  .  Lyrica [Pregabalin]     Hallucinations   . Methyldopa     Aldomet (for hypertension): Reaction not recalled by the patient  . Orange Fruit [Citrus] Other (See Comments)    Indigestion/heartburn  . Cetirizine Hcl Itching and Rash  . Codeine Itching  . Levaquin [Levofloxacin In D5w] Rash  . Tomato Rash    IV Location/Drains/Wounds Patient Lines/Drains/Airways Status   Active Line/Drains/Airways    Name:   Placement date:   Placement time:   Site:   Days:   Peripheral IV 11/26/17 Right  Antecubital   11/26/17    1456    Antecubital   less than 1   Peripheral IV 11/26/17 Right Forearm   11/26/17    1456    Forearm   less than 1   Incision 08/07/13 Knee Left   08/07/13    1019     1572   Wound / Incision (Open or Dehisced) 05/25/17 Non-pressure wound Leg Right 3 circular wound/pt ff/up with dermatologist   05/25/17    2030    Leg   185          Labs/Imaging Results for orders placed or performed during the hospital encounter of 11/26/17 (from the past 48 hour(s))  Comprehensive metabolic panel     Status: Abnormal   Collection Time: 11/26/17  2:44 PM  Result Value Ref Range   Sodium 135 135 - 145 mmol/L   Potassium 2.9 (L) 3.5 - 5.1 mmol/L   Chloride 102 101 - 111 mmol/L   CO2 24 22 - 32 mmol/L   Glucose, Bld 105 (H) 65 - 99 mg/dL   BUN 21 (H) 6 - 20 mg/dL   Creatinine, Ser 0.76 0.44 - 1.00 mg/dL   Calcium 9.2 8.9 - 10.3 mg/dL   Total Protein 6.8 6.5 - 8.1 g/dL   Albumin 3.4 (L) 3.5 - 5.0 g/dL   AST 21 15 - 41 U/L   ALT 13 (L) 14 - 54 U/L   Alkaline Phosphatase 54 38 - 126 U/L   Total Bilirubin 0.9 0.3 - 1.2 mg/dL   GFR calc non Af Amer >60 >60 mL/min   GFR calc Af Amer >60 >60 mL/min    Comment: (NOTE) The eGFR has been calculated using the CKD EPI equation. This calculation has not been validated in all clinical situations. eGFR's persistently <60 mL/min signify possible Chronic Kidney Disease.    Anion gap 9 5 - 15  CBC WITH DIFFERENTIAL     Status: Abnormal   Collection Time: 11/26/17  2:44 PM  Result Value Ref Range   WBC 13.5 (H) 4.0 - 10.5 K/uL   RBC 3.17 (L) 3.87 - 5.11 MIL/uL   Hemoglobin 10.5 (L) 12.0 - 15.0 g/dL   HCT 30.9 (L) 36.0 - 46.0 %   MCV 97.5 78.0 - 100.0 fL   MCH 33.1 26.0 - 34.0 pg   MCHC 34.0 30.0 - 36.0 g/dL   RDW 15.7 (H) 11.5 - 15.5 %   Platelets 319 150 - 400 K/uL   Neutrophils Relative % 77 %   Neutro Abs 10.5 (H) 1.7 - 7.7 K/uL   Lymphocytes Relative 14 %   Lymphs Abs 1.8 0.7 - 4.0 K/uL   Monocytes Relative 8 %    Monocytes Absolute 1.1 (H) 0.1 - 1.0 K/uL   Eosinophils Relative 1 %   Eosinophils Absolute 0.1 0.0 - 0.7 K/uL   Basophils Relative 0 %   Basophils Absolute 0.0 0.0 - 0.1 K/uL  Magnesium     Status: Abnormal   Collection Time: 11/26/17  2:44 PM  Result Value Ref Range   Magnesium 1.2 (L) 1.7 - 2.4 mg/dL  Sedimentation rate     Status: Abnormal   Collection Time: 11/26/17  2:44 PM  Result Value Ref Range   Sed Rate 32 (H) 0 - 22 mm/hr  Urinalysis, Routine w reflex microscopic     Status: None   Collection Time: 11/26/17  2:53 PM  Result Value Ref Range   Color, Urine YELLOW YELLOW   APPearance CLEAR CLEAR   Specific Gravity, Urine 1.010 1.005 - 1.030   pH 8.0 5.0 - 8.0   Glucose, UA NEGATIVE NEGATIVE mg/dL   Hgb urine dipstick NEGATIVE NEGATIVE   Bilirubin Urine NEGATIVE NEGATIVE   Ketones, ur NEGATIVE NEGATIVE mg/dL   Protein, ur NEGATIVE NEGATIVE mg/dL   Nitrite NEGATIVE NEGATIVE   Leukocytes, UA NEGATIVE NEGATIVE  I-Stat CG4 Lactic Acid, ED  (not at  [facility])     Status: None   Collection Time: 11/26/17  3:03 PM  Result Value Ref Range   Lactic Acid, Venous 1.50 0.5 - 1.9 mmol/L  Influenza panel by PCR (type A & B)     Status: None   Collection Time: 11/26/17  3:41 PM  Result Value Ref Range   Influenza A By PCR NEGATIVE NEGATIVE   Influenza B By PCR NEGATIVE NEGATIVE    Comment: (NOTE) The Xpert Xpress Flu assay is intended as an aid in the diagnosis of  influenza and should not be used as a sole basis for treatment.  This  assay is FDA approved for nasopharyngeal swab specimens only. Nasal  washings and aspirates are unacceptable for Xpert Xpress Flu testing.   I-Stat CG4 Lactic Acid, ED  (not at  [facility])     Status: None   Collection Time: 11/26/17  6:39 PM  Result Value Ref Range   Lactic Acid, Venous 1.23 0.5 - 1.9 mmol/L   Dg Chest 2 View  Result Date: 11/26/2017 CLINICAL DATA:  Fever.  Generalized body pain.  Left arm pain. EXAM: CHEST  2 VIEW COMPARISON:   05/25/2017 FINDINGS: The cardiac silhouette remains moderately enlarged. Thoracic aortic tortuosity is similar to the prior study. There is minimal central pulmonary vascular congestion without evidence of overt edema, airspace consolidation, pleural effusion, or pneumothorax. Advanced degenerative changes are again seen about both shoulders, with postsurgical changes noted on the right. IMPRESSION: Chronic cardiomegaly without evidence of acute abnormality. Electronically Signed   By: Logan Bores [name]   On: 11/26/2017 15:11   Dg Shoulder 1v Left  Result Date: 11/26/2017 CLINICAL DATA:  Severe shoulder pain EXAM: LEFT SHOULDER - 1 VIEW COMPARISON:  None. FINDINGS: No fracture or dislocation. Marked degenerative change at the [facility] joint. Narrowed subacromial space suggesting rotator cuff disease. Erosions at the distal clavicle. Flattening and sclerosis within the humeral head. IMPRESSION: 1. Advanced arthritis at the glenohumeral interval and AC joint 2. Erosive change at the distal end of the clavicle, can be seen with rheumatoid. 3. Narrowed subacromial space suggesting rotator cuff pathology. No fracture or dislocation. Electronically Signed   By: Donavan Foil [name]   On: 11/26/2017 19:20   Dg Shoulder Right  Result Date: 11/26/2017 CLINICAL DATA:  Severe shoulder pain EXAM: RIGHT SHOULDER - 2+ VIEW COMPARISON:  None. FINDINGS: Tapering of the distal clavicle likely due to prior surgery. Fixating screws within the acromion. Surgical anchor in the right humeral head. Advanced arthritis of  the glenohumeral interval with narrowing, and sclerosis. Flattened appearance of the humeral head with prominent spurring. IMPRESSION: 1. Postsurgical changes of the acromion and humeral head. Suspect prior distal resection of right clavicle 2. No acute fracture or malalignment 3. Severe arthritis of the right glenohumeral interval Electronically Signed   By: Donavan Foil [name]   On: 11/26/2017 19:27   Dg Elbow 2 Views  Left  Result Date: 11/26/2017 CLINICAL DATA:  Rheumatoid with severe pain EXAM: LEFT ELBOW - 2 VIEW COMPARISON:  None. FINDINGS: Limited by positioning. No dislocation. Age indeterminate deformity of the radial head. Soft tissue swelling is present. IMPRESSION: Age indeterminate deformity of the radial head. Soft tissue swelling. Electronically Signed   By: Donavan Foil [name]   On: 11/26/2017 19:18   Dg Elbow 2 Views Right  Result Date: 11/26/2017 CLINICAL DATA:  Severe pain EXAM: RIGHT ELBOW - 2 VIEW COMPARISON:  None. FINDINGS: No fracture or malalignment.  No large elbow effusion. IMPRESSION: No acute osseous abnormality. Electronically Signed   By: Donavan Foil [name]   On: 11/26/2017 19:18   Dg Wrist 2 Views Left  Result Date: 11/26/2017 CLINICAL DATA:  Severe wrist pain EXAM: LEFT WRIST - 2 VIEW COMPARISON:  None. FINDINGS: Diffuse osteopenia. No fracture or dislocation is evident. Severe intercarpal arthritic changes and arthritis at the [facility] joints. Multifocal erosions at the base of the third and fourth metacarpals. Narrowing of the distal radial carpal joint. Os or old fracture adjacent to the ulnar styloid. Chronic deformity of the scaphoid bone with widening of the scaffold lunate interval and proximal migration of the capitate bone. Scattered erosions within the carpal bones along the radial aspect of the wrist. IMPRESSION: 1. Osteopenia with multifocal bony erosions consistent with inflammatory arthropathy. 2. SLAC configuration of the carpal bones, which may also be seen in the setting of rheumatoid arthritis. 3. No fracture is seen. Electronically Signed   By: Donavan Foil [name]   On: 11/26/2017 19:09   Dg Wrist 2 Views Right  Result Date: 11/26/2017 CLINICAL DATA:  Arthritis, wrist pain EXAM: RIGHT WRIST - 2 VIEW COMPARISON:  12/18/2011 FINDINGS: Osteopenia. No acute displaced fracture. Large lucent lesion in the distal radius. Multiple erosions involving base of fourth and fifth  metacarpals, and the carpal bones along the ulnar aspect of the wrist. Chronic deformity of the scaphoid. Widened scapholunate interval with proximal migration of the capitate. Marked arthropathy at the radiocarpal interval. Large amount of soft tissue swelling. Mild volar subluxation of the carpal bones with respect to the distal radius on lateral view. IMPRESSION: 1. Large amount of soft tissue swelling at the wrist. No obvious fracture is seen. Mild volar subluxation of carpal bones with respect to the distal radius on lateral view 2. Multiple bony erosions involving the carpal bones and metacarpals consistent with inflammatory arthritis. SLAC configuration of the wrist which may also be seen in the setting of rheumatoid. Large lucent lesion in the distal radius, may reflect large subchondral cyst. Electronically Signed   By: Donavan Foil [name]   On: 11/26/2017 19:16   Dg Tibia/fibula Right  Result Date: 11/26/2017 CLINICAL DATA:  Open wound to the lower leg EXAM: RIGHT TIBIA AND FIBULA - 2 VIEW COMPARISON:  05/25/2017 FINDINGS: No fracture or malalignment is seen. Soft tissue swelling. No soft tissue emphysema. Extensive vascular calcifications. Probable dense vascular calcification anterior to the distal tibia. Plantar calcaneal spur. Right knee replacement with normal alignment. Small suprapatellar effusion IMPRESSION: 1. No acute osseous abnormality.  Negative for soft tissue emphysema 2. Right knee replacement.  Small suprapatellar effusion Electronically Signed   By: Donavan Foil [name]   On: 11/26/2017 18:58   Dg Hand 2 View Right  Result Date: 11/26/2017 CLINICAL DATA:  Severe hand pain history of rheumatoid EXAM: RIGHT HAND - 2 VIEW COMPARISON:  None. FINDINGS: Diffuse osteopenia. No fracture is seen. Erosions at the base of the fourth and fifth metacarpals. Soft tissue swelling is present. Flexion at the second through fifth PIP joints. IMPRESSION: 1. No acute osseous abnormality 2. Diffuse  osteopenia and bony erosive changes at the base of the metacarpals consistent with erosive/inflammatory arthropathy Electronically Signed   By: Donavan Foil [name]   On: 11/26/2017 19:03   Dg Hand Complete Left  Result Date: 11/26/2017 CLINICAL DATA:  Severe hand pain history of rheumatoid EXAM: LEFT HAND - COMPLETE 3+ VIEW COMPARISON:  None. FINDINGS: Distal third digit is partially obscured by monitoring device. Diffuse osteopenia. No acute fracture or malalignment is seen. Erosive changes at the head of the first metacarpal. Advanced arthritis at the [facility] joints. Probable erosive changes at the base of the fourth metacarpal. Suspected erosion base of first proximal phalanx. IMPRESSION: Diffuse osteopenia with erosive changes suspected at the base of fourth metacarpal and head of first metacarpal and base of first proximal phalanx, consistent with erosive arthropathy. Marked arthritis at the [facility] joints. Electronically Signed   By: Donavan Foil [name]   On: 11/26/2017 19:01    Pending Labs Unresulted Labs (From admission, onward)   Start     Ordered   11/26/17 1956  Synovial cell count + diff, w/ crystals  STAT,   R     11/26/17 1956   11/26/17 1956  Body fluid culture  STAT,   R    Question:  Are there also cytology or pathology orders on this specimen?  Answer:  No   11/26/17 1956   04/88/89 1694  CYCLIC CITRUL PEPTIDE ANTIBODY, IGG/IGA  Add-on,   R     11/26/17 1819   11/26/17 1819  ANA w/Reflex if Positive  Add-on,   R     11/26/17 1819   11/26/17 1819  Rheumatoid factor  Add-on,   R     11/26/17 1819   11/26/17 1819  C-reactive protein  Once,   R     11/26/17 1819   11/26/17 1818  Uric acid  Add-on,   R     11/26/17 1819   11/26/17 1812  Hemoglobin A1c  Once,   R     11/26/17 1812   11/26/17 1812  HIV antibody  Once,   R     11/26/17 1812   11/26/17 1812  Prealbumin  Once,   R     11/26/17 1812   11/26/17 1410  Blood Culture (routine x 2)  BLOOD CULTURE X 2,   STAT     11/26/17  1410   11/26/17 1410  Urine culture  STAT,   STAT     11/26/17 1410   Signed and Held  CBC  (enoxaparin (LOVENOX)    CrCl >/= 30 ml/min)  Once,   R    Comments:  Baseline for enoxaparin therapy IF NOT ALREADY DRAWN.  Notify MD if PLT < 100 K.    Signed and Held   Signed and Held  Creatinine, serum  (enoxaparin (LOVENOX)    CrCl >/= 30 ml/min)  Once,   R    Comments:  Baseline for enoxaparin  therapy IF NOT ALREADY DRAWN.    Signed and Held   Signed and Held  Creatinine, serum  (enoxaparin (LOVENOX)    CrCl >/= 30 ml/min)  Weekly,   R    Comments:  while on enoxaparin therapy    Signed and Held   Signed and Held  Comprehensive metabolic panel  Tomorrow morning,   R     Signed and Held   Signed and Held  CBC  Tomorrow morning,   R     Signed and Held      Vitals/Pain Today's Vitals   11/26/17 1630 11/26/17 1700 11/26/17 1730 11/26/17 2031  BP: (!) 190/76 (!) 163/80 (!) 164/72   Pulse: (!) 101 99 94   Resp: (!) 23 20 (!) 23   Temp:      TempSrc:      SpO2: 96% 100% 98%   PainSc:    7     Isolation Precautions Droplet precaution  Medications Medications  ceFEPIme (MAXIPIME) 2 g in dextrose 5 % 50 mL IVPB (not administered)  predniSONE (DELTASONE) tablet 50 mg (not administered)  vancomycin (VANCOCIN) IVPB 750 mg/150 ml premix (not administered)  insulin aspart (novoLOG) injection 0-9 Units (not administered)  insulin aspart (novoLOG) injection 0-5 Units (not administered)  piperacillin-tazobactam (ZOSYN) IVPB 3.375 g (0 g Intravenous Stopped 11/26/17 1612)  vancomycin (VANCOCIN) IVPB 1000 mg/200 mL premix (0 mg Intravenous Stopped 11/26/17 1837)  sodium chloride 0.9 % bolus 1,000 mL (1,000 mLs Intravenous New Bag/Given 11/26/17 1531)  acetaminophen (TYLENOL) tablet 650 mg (650 mg Oral Given 11/26/17 1532)  fentaNYL (SUBLIMAZE) injection 50 mcg (50 mcg Intravenous Given 11/26/17 1532)  predniSONE (DELTASONE) tablet 7.5 mg (7.5 mg Oral Given 11/26/17 1632)  potassium chloride SA  (K-DUR,KLOR-CON) CR tablet 40 mEq (40 mEq Oral Given 11/26/17 1632)  morphine 2 MG/ML injection 1 mg (1 mg Intravenous Given 11/26/17 1919)    Mobility non-ambulatory

## 2017-11-26 NOTE — H&P (Signed)
**Note De-Identified vi Obfusction** History nd Physicl    [name] NUU:[id number] DOB: 1930/04/29 DOA: 11/26/2017  PCP: Briscoe Deutscher, DO  Ptient coming from: home  I hve personlly briefly reviewed ptient's old medicl records in [place]  Chief Complint: joint pin  HPI: [name] [name] is  84 y.o. femle with medicl history significnt of RA, OA, gout, CAD, CVA, T2DM presenting with joint pin.  She notes her symptoms strted this morning.  She notes she woke up with "pin ll over".  Pin in her bilterl shoulder,s, elbows, nd hnds.  She ws so pinful she couldn't be moved due to the pin.  Similr to previous rthritis flres, but she's never hd one like this before.  Sttes it's "10 x worse thn RA pin".  She sw rheumtologist yesterdy, but not hving flre t this point.  She denies fevers, chills, CP, SOB, bdominl pin, numbness, tingling, wekness.  She notes  mild cough tht hs resolved.    She's hd lower extremity wounds for ~1.5 yers.  Goes every 2 weeks to wound center.  They thought things were getting better.  Dughter thinks wound looks  bit worse tody.    ED Course: Lbs, ntibiotics for possible wound infection.    Review of Systems: As per HPI otherwise 10 point review of systems negtive.   Pst Medicl History:  Dignosis Dte  . Allergic rhinitis due to pollen 04/27/2007  . Arthritis    "in q joint" (08/07/2013)  . Coronry therosclerosis of ntive coronry rtery   . Cough   . Disorder of bone nd crtilge, unspecified   . [place] 05/03/2013  . Exertionl shortness of breth    "sometimes" (08/07/2013)  . First degree trioventriculr block   . Gout, unspecified 04/19/2013  . Hyperlipidemi 04/27/2007  . Hypertension   . Muscle spsm   . Muscle wekness (generlized)   . Myocrdil infrction ([place]) ~ 1970  . Onychi nd pronychi of toe   . Osteorthrosis, unspecified whether generlized or loclized, unspecified site 04/27/2007  . Other fll   .  Other mlise nd ftigue   . Preptellr bursitis   . Seizures ([place])   . Stroke ([place]) 01/17/2014  . TIA (trnsient ischemic ttck)    " few one summer" (08/07/2013)  . Type II dibetes mellitus ([place])    "fsting 90-110s" (08/07/2013)  . Unspecified essentil hypertension 08/08/2013  . Unspecified vitmin D deficiency     Pst Surgicl History:  Procedure Lterlity Dte  . ABDOMINAL HYSTERECTOMY  04/1980  . APPENDECTOMY  1960  . CARDIAC CATHETERIZATION  2003  . CATARACT EXTRACTION W/ INTRAOCULAR LENS  IMPLANT, BILATERAL Bilterl ~ 2012  . JOINT REPLACEMENT    . REPLACEMENT TOTAL KNEE Right 09/2005  . SHOULDER OPEN ROTATOR CUFF REPAIR Right 07/1999  . TONSILLECTOMY  08/1974  . TOTAL KNEE ARTHROPLASTY Left 08/07/2013   Procedure: TOTAL KNEE ARTHROPLASTY- LEFT;  Surgeon: Vickey Huger, MD;  Loction: [place];  Service: Orthopedics;  Lterlity: Left;  . TRANSTHORACIC ECHOCARDIOGRAM  2003   EF 55-65%; mild concentric LVH     reports tht  hs never smoked. she hs never used smokeless tobcco. She reports tht she does not drink lcohol or use drugs.  Allergies  Allergen Rections  . Clonidine Derivtives Swelling    Ptient's dughter reports ptient's tongue ws swollen nd ptient hllucinted  . Fish Allergy Dirrhe, Swelling nd Other (See Comments)    Turns skin "blck," but cn tolerte white fish Slmon- Dirrhe  . Shellfish Allergy **Note De-Identified vi Obfusction** Hives  . Doxycycline Rsh  . Indomethcin     Rection not reclled by the ptient  . Lyric [Pregblin]     Hllucintions   . Methyldop     ldomet (for hypertension): Rection not reclled by the ptient  . Ornge Fruit [Citrus] Other (See Comments)    Indigestion/hertburn  . Cetirizine Hcl Itching nd Rsh  . Codeine Itching  . Levquin [Levofloxcin In D5w] Rsh  . Tomto Rsh    Fmily History  Problem Reltion ge of Onset  . Hert ttck Fther    Prior to dmission medictions   Mediction Sig Strt Dte End Dte  Tking? uthorizing Provider  lbuterol (PROVENTIL HF;VENTOLIN HF) 108 (90 Bse) MCG/CT inhler Inhle 1 puff into the lungs every 6 (six) hours s needed for wheezing or shortness of breth.   Yes Provider, Historicl, MD  llopurinol (ZYLOPRIM) 100 MG tblet tke 1 tblet by mouth once dily for GOUT Ptient tking differently: Tke 100 mg by mouth every evening.  03/11/17  Yes Briscoe Deutscher, DO  spirin EC 81 MG tblet Tke 81 mg by mouth dily.   Yes Provider, Historicl, MD  clcium-vitmin D (OSCL WITH D) 500-200 MG-UNIT per tblet Tke 1 tblet by mouth dily.    Yes Provider, Historicl, MD  CHLOROPHYLL PO Tke 5 drops by mouth See dmin instructions. MIX INTO 6 OUNCES OF JUICE ND DRINK ONCE  DY   Yes Provider, Historicl, MD  ferrous sulfte 325 (65 FE) MG tblet Tke 325 mg by mouth dily.    Yes Provider, Historicl, MD  Fluticsone-Slmeterol (DVIR DISKUS) 250-50 MCG/DOSE EPB Inhle 1 puff into the lungs 2 (two) times dily. 05/03/13  Yes Provider, Historicl, MD  folic cid (FOLVITE) 1 MG tblet Tke 1 tblet (1 mg totl) by mouth dily. 09/21/16 12/15/17 Yes Pnwl, Nitik, P-C  furosemide (LSIX) 20 MG tblet Tke 1 tblet (20 mg totl) by mouth every other dy. 05/27/17  Yes Nit Sells, MD  gbpentin (NEURONTIN) 100 MG cpsule Tke 1 cpsule (100 mg totl) t bedtime by mouth. 09/13/17  Yes Briscoe Deutscher, DO  hydrLZINE (PRESOLINE) 100 MG tblet tke 1 tblet by mouth three times  dy 10/14/17  Yes Briscoe Deutscher, DO  hydroxypropyl methylcellulose / hypromellose (ISOPTO TERS / GONIOVISC) 2.5 % ophthlmic solution Plce 1 drop into both eyes 4 (four) times dily s needed for dry eyes. 07/07/17  Yes Briscoe Deutscher, DO  lbetlol (NORMODYNE) 100 MG tblet tke 2 tblets by mouth twice  dy 10/27/17  Yes Briscoe Deutscher, DO  metFORMIN (GLUCOPHGE) 500 MG tblet tke 1 tblet by mouth once dily for dibetes Ptient tking differently: Tke 500 mg by mouth dily with  brekfst.  03/11/17  Yes Briscoe Deutscher, DO  methotrexte (RHEUMTREX) 2.5 MG tblet Tke 10 mg by mouth once  week.  11/26/17  Yes Provider, Historicl, MD  Multiple Vitmins-Minerls (LIVE ONCE DILY WOMENS PO) Tke 15 mLs by mouth every other dy.    Yes Provider, Historicl, MD  oxycodone (OXY-IR) 5 MG cpsule Tke 5 mg by mouth every 4 (four) hours s needed for pin.   Yes Provider, Historicl, MD  predniSONE (DELTSONE) 5 MG tblet tke 1 tblet by mouth once dily WITH BREKFST 08/18/17  Yes Deveshwr, bel Presto, MD  SNTYL ointment pply 1 ppliction topiclly dily. S DIRECTED 10/27/17  Yes Provider, Historicl, MD  trMDol (ULTRM) 50 MG tblet Tke 1-2 tblets every 6 hours s needed for pin 09/13/17  Yes Briscoe Deutscher, DO  trimcinolone ointment (KENLOG)  0.5 % Apply 1 application 2 (two) times daily topically. 09/13/17  Yes Briscoe Deutscher, DO  vitamin C (ASCORBIC ACID) 500 MG tablet Take 500 mg by mouth every other day.    Yes [provider]  blood glucose meter kit and supplies KIT Dispense based on patient and insurance preference. Use up to four times daily as directed. (FOR ICD-9 250.00, 250.01). 07/30/17   Briscoe Deutscher, DO  ketoconazole (NIZORAL) 2 % cream Apply 1 application topically 2 (two) times daily. Patient not taking: Reported on 11/26/2017 12/03/16   Robyn Haber, MD    Physical Exam: Vitals:   11/26/17 1330 11/26/17 1613 11/26/17 1623 11/26/17 1700  BP: (!) 184/73  (!) 194/78 (!) 163/80  Pulse: (!) 109  (!) 101 99  Resp: (!) 22  (!) 22 20  Temp: (!) 101.5 F (38.6 C) 99.7 F (37.6 C)    TempSrc: Rectal Oral    SpO2: 98%  94% 100%    Constitutional: NAD, calm, comfortable Vitals:   11/26/17 1330 11/26/17 1613 11/26/17 1623 11/26/17 1700  BP: (!) 184/73  (!) 194/78 (!) 163/80  Pulse: (!) 109  (!) 101 99  Resp: (!) 22  (!) 22 20  Temp: (!) 101.5 F (38.6 C) 99.7 F (37.6 C)    TempSrc: Rectal Oral    SpO2: 98%  94% 100%   Eyes: PERRL,  lids and conjunctivae normal ENMT: Mucous membranes are moist. Posterior pharynx clear of any exudate or lesions.Normal dentition.  Neck: normal, supple, no masses, no thyromegaly Respiratory: clear to auscultation bilaterally, no wheezing, no crackles. Normal respiratory effort. No accessory muscle use.  Cardiovascular: Regular rate and rhythm, no murmurs / rubs / gallops. No extremity edema. 2+ pedal pulses. No carotid bruits.  Abdomen: no tenderness, no masses palpated. No hepatosplenomegaly. Bowel sounds positive.  Musculoskeletal: Mild TTP to bilateral shoulders.  TTP on L elbow with effusion (most tender).  TTP to L wrist, hand and fingers.  TTP to R elbow, wrist, hands, and fingers.  Skin: Well circumscribed RLE ulcers with some purulence Neurologic: CN 2-12 grossly intact. Sensation intact, DTR normal. Strength 5/5 in all 4.  Psychiatric: Normal judgment and insight. Alert and oriented x 3. Normal mood.        Labs on Admission: I have personally reviewed following labs and imaging studies  CBC: Recent Labs  Lab 11/26/17 1444  WBC 13.5*  NEUTROABS 10.5*  HGB 10.5*  HCT 30.9*  MCV 97.5  PLT 650   Basic Metabolic Panel: Recent Labs  Lab 11/26/17 1444  NA 135  K 2.9*  CL 102  CO2 24  GLUCOSE 105*  BUN 21*  CREATININE 0.76  CALCIUM 9.2  MG 1.2*   GFR: CrCl cannot be calculated (Unknown ideal weight.). Liver Function Tests: Recent Labs  Lab 11/26/17 1444  AST 21  ALT 13*  ALKPHOS 54  BILITOT 0.9  PROT 6.8  ALBUMIN 3.4*   No results for input(s): LIPASE, AMYLASE in the last 168 hours. No results for input(s): AMMONIA in the last 168 hours. Coagulation Profile: No results for input(s): INR, PROTIME in the last 168 hours. Cardiac Enzymes: No results for input(s): CKTOTAL, CKMB, CKMBINDEX, TROPONINI in the last 168 hours. BNP (last 3 results) No results for input(s): PROBNP in the last 8760 hours. HbA1C: No results for input(s): HGBA1C in the last 72  hours. CBG: No results for input(s): GLUCAP in the last 168 hours. Lipid Profile: No results for input(s): CHOL, HDL, LDLCALC, TRIG, CHOLHDL, **Note De-Identified vi Obfusction** LDLDIRECT in the lst 72 hours. Thyroid Function Tests: No results for input(s): TSH, T4TOTL, FREET4, T3FREE, THYROIDB in the lst 72 hours. nemi Pnel: No results for input(s): VITMINB12, FOLTE, FERRITIN, TIBC, IRON, RETICCTPCT in the lst 72 hours. Urine nlysis:    Component Vlue Dte/Time   COLORURINE YELLOW 11/26/2017 [place] 11/26/2017 1453   LBSPEC 1.010 11/26/2017 1453   PHURINE 8.0 11/26/2017 1453   GLUCOSEU NEGTIVE 11/26/2017 1453   HGBUR NEGTIVE 11/26/2017 1453   BILIRUBINUR NEGTIVE 11/26/2017 1453   BILIRUBINUR Negtive 01/13/2017 1234   KETONESUR NEGTIVE 11/26/2017 1453   PROTEINUR NEGTIVE 11/26/2017 1453   UROBILINOGEN 0.2 01/13/2017 1234   UROBILINOGEN 1.0 09/11/2015 0135   NITRITE NEGTIVE 11/26/2017 1453   LEUKOCYTESUR NEGTIVE 11/26/2017 1453    Rdiologicl Exms on dmission: Dg Chest 2 View  Result Dte: 11/26/2017 CLINICL DT:  Fever.  Generlized body pin.  Left rm pin. EXM: CHEST  2 VIEW COMPRISON:  05/25/2017 FINDINGS: The crdic silhouette remins modertely enlrged. Thorcic ortic tortuosity is similr to the prior study. There is miniml centrl pulmonry vsculr congestion without evidence of overt edem, irspce consolidtion, pleurl effusion, or pneumothorx. dvnced degenertive chnges re gin seen bout both shoulders, with postsurgicl chnges noted on the right. IMPRESSION: Chronic crdiomegly without evidence of cute bnormlity. Electroniclly Signed   By: Logn Bores [name]   On: 11/26/2017 15:11    EKG: Independently reviewed. ppers similr to prior.  Sinus tchycrdi, norml xis, slightly prolonged qtc.  Old Q wves in III nd vF.   ssessment/Pln ctive Problems:   Sepsis ([place])  Systemic Inflmmtory Response Syndrome, possible sepsis due to  infected R lower extremity wounds vs septic rthritis:  Pt with fever, tchycrdi.  Possible source of infection with RLE wounds vs possible septic joint.  RLE with possibly more purulence thn noted previously.  Her presenting sx ws polyrticulr joint pin.  For now will tret with brod spectrum bx.  This could be relted to her inflmmtory rthritis.  ESR, CRP Blood nd urine cultures Negtive influenz Consulted ortho given joint pin, consider tpping L elbow (most pinful joint nd effusion noted on exm). Continue vnc/cefepime/flgyl -> nrrow s ble Lower extremity wound orders -> BI, 1c, wound c/s, sw, cse mngement, nutrition  Polyrticulr Join Pin  Rheumtoid rthritis  Gout  O:  Polyrticulr ntrue of pin mkes me concerned for R flre or gout flre or other inflmmtory cuse of her rthritis.  She sw rheum yesterdy nd ws on llopurinol 150 mg dily.  Currently on prednisone 7.5 mg dily.  lso methotrexte once weekly (just took this lst night) (ws on humir, xeljnz, orenci, nd ctemr in pset).  .  Follow imging of bilterl shoulder, elbow, wrists, nd hnds Ortho c/s for L elbow joint spirtion  Inflmmtory mrkers (ESR, CRP, N, ntiCCP, RF) Steroids for possible flre, solumedrol tonight then 40 mg prednisone tomorrow Will need outptient rheum follow up Scheduled PP, prn oxy nd morphine for brekthrough pin  cute Encephlopthy: pt &Ox3, but  bit confused per dughter.  No focl deficits.  I think this is probbly due to pin nd possibly infection bove.  Will continue to monitor for now, w/u further s indicted.   HTN: continue hydrl, lbetlol.  Holding lsix for now, she tkes this QOD nd fmily cn't tell me for sure if she's tking it.  Hx CD  CV:  No CP or neurologic sx. Continue S  T2DM: SSI, hold metformin gbpentin  Hypoklemi: follow mg,  replete prn  Leukocytosis: 2/2 infection vs inflammation vs chronic  steroid use  Anemia: chronic, ctm  She wasn't sure about advair, lasix, or gabapentin.  Held advair and lasix.  Continued gabapentin.  Asked daughter to check and bring med list.   DVT prophylaxis: lovenox  Code Status: DNR  Family Communication: daughter at bedside  Disposition Plan: pending improvement Consults called: orthopedics  Admission status: inpatient given fever and likely infection requiring IV antibiotics and also possible inflammatory arthritis requiring IV pain meds and steroids.   Fayrene Helper MD [facility] Pager [phone]  If 7PM-7AM, please contact night-coverage [email or web address] Password [email or web address]  11/26/2017, 6:22 PM

## 2017-11-27 ENCOUNTER — Encounter (HOSPITAL_COMMUNITY): Payer: Self-pay

## 2017-11-27 ENCOUNTER — Encounter (HOSPITAL_COMMUNITY): Payer: Medicare Other

## 2017-11-27 LAB — GLUCOSE, CAPILLARY
Glucose-Capillary: 166 mg/dL — ABNORMAL HIGH (ref 65–99)
Glucose-Capillary: 201 mg/dL — ABNORMAL HIGH (ref 65–99)
Glucose-Capillary: 194 mg/dL — ABNORMAL HIGH (ref 65–99)
Glucose-Capillary: 173 mg/dL — ABNORMAL HIGH (ref 65–99)
Glucose-Capillary: 250 mg/dL — ABNORMAL HIGH (ref 65–99)

## 2017-11-27 LAB — COMPREHENSIVE METABOLIC PANEL
ALT: 13 U/L — ABNORMAL LOW (ref 14–54)
AST: 20 U/L (ref 15–41)
Albumin: 3.1 g/dL — ABNORMAL LOW (ref 3.5–5.0)
Alkaline Phosphatase: 51 U/L (ref 38–126)
Anion gap: 6 (ref 5–15)
BUN: 19 mg/dL (ref 6–20)
CO2: 24 mmol/L (ref 22–32)
Calcium: 8.9 mg/dL (ref 8.9–10.3)
Chloride: 103 mmol/L (ref 101–111)
Creatinine, Ser: 0.93 mg/dL (ref 0.44–1.00)
GFR calc Af Amer: >60 mL/min (ref >60)
GFR calc non Af Amer: 54 mL/min — ABNORMAL LOW (ref >60)
Glucose, Bld: 224 mg/dL — ABNORMAL HIGH (ref 65–99)
Potassium: 4.5 mmol/L (ref 3.5–5.1)
Sodium: 133 mmol/L — ABNORMAL LOW (ref 135–145)
Total Bilirubin: 1 mg/dL (ref 0.3–1.2)
Total Protein: 6.7 g/dL (ref 6.5–8.1)

## 2017-11-27 LAB — BLOOD GAS, VENOUS
Acid-Base Excess: 0.2 mmol/L (ref 0.0–2.0)
Bicarbonate: 24.2 mmol/L (ref 20.0–28.0)
O2 Saturation: 89.7 %
Patient temperature: 98.6
pCO2, Ven: 38.7 mmHg — ABNORMAL LOW (ref 44.0–60.0)
pH, Ven: 7.412 (ref 7.250–7.430)
pO2, Ven: 59.7 mmHg — ABNORMAL HIGH (ref 32.0–45.0)

## 2017-11-27 LAB — HIV ANTIBODY (ROUTINE TESTING W REFLEX): HIV Screen 4th Generation wRfx: NONREACTIVE

## 2017-11-27 LAB — HEMOGLOBIN A1C
Hgb A1c MFr Bld: 5.4 % (ref 4.8–5.6)
Mean Plasma Glucose: 108.28 mg/dL

## 2017-11-27 LAB — CBC
HCT: 31.6 % — ABNORMAL LOW (ref 36.0–46.0)
Hemoglobin: 10.6 g/dL — ABNORMAL LOW (ref 12.0–15.0)
MCH: 32.7 pg (ref 26.0–34.0)
MCHC: 33.5 g/dL (ref 30.0–36.0)
MCV: 97.5 fL (ref 78.0–100.0)
Platelets: 328 10*3/uL (ref 150–400)
RBC: 3.24 MIL/uL — ABNORMAL LOW (ref 3.87–5.11)
RDW: 15.9 % — ABNORMAL HIGH (ref 11.5–15.5)
WBC: 19 10*3/uL — ABNORMAL HIGH (ref 4.0–10.5)

## 2017-11-27 LAB — MAGNESIUM
Magnesium: 3.3 mg/dL — ABNORMAL HIGH (ref 1.7–2.4)
Magnesium: 2.8 mg/dL — ABNORMAL HIGH (ref 1.7–2.4)

## 2017-11-27 LAB — C-REACTIVE PROTEIN: CRP: 13.5 mg/dL — ABNORMAL HIGH (ref <1.0)

## 2017-11-27 LAB — PREALBUMIN: Prealbumin: 20.5 mg/dL (ref 18–38)

## 2017-11-27 MED ORDER — OXYCODONE HCL 5 MG PO TABS: 2.5000 mg | ORAL_TABLET | Freq: Three times a day (TID) | ORAL | Status: DC | PRN

## 2017-11-27 MED ORDER — SODIUM CHLORIDE 0.9 % IV SOLN
INTRAVENOUS | Status: DC
  Administered 2017-11-27: 12:00:00 via INTRAVENOUS

## 2017-11-27 MED ORDER — OXYCODONE HCL 5 MG PO TABS: 2.5000 mg | ORAL_TABLET | Freq: Four times a day (QID) | ORAL | Status: DC | PRN

## 2017-11-27 MED ORDER — NALOXONE HCL 0.4 MG/ML IJ SOLN: 0.4000 mg | INTRAMUSCULAR | Status: DC | PRN

## 2017-11-27 MED ORDER — DEXTROSE 5 % IV SOLN: 2.0000 g | INTRAVENOUS | Status: DC

## 2017-11-27 MED ORDER — NALOXONE HCL 0.4 MG/ML IJ SOLN
INTRAMUSCULAR | Status: AC
  Administered 2017-11-27: 0.4 mg
  Filled 2017-11-27: qty 1

## 2017-11-27 NOTE — Evaluation (Signed)
Physical Therapy Evaluation Patient Details Name: [name] [name] MRN: [id number] DOB: 09-Aug-1930 Today's Date: 11/27/2017   History of Present Illness  SAMANTHAMARIE EZZELL is a 84 y.o. female with medical history significant of RA, OA, B TKA, HTN, seizures, MI, gout, CAD, CVA, T2DM presenting with joint pain.  Clinical Impression  Pt admitted with above diagnosis. Pt currently with functional limitations due to the deficits listed below (see PT Problem List). Pt will benefit from skilled PT to increase their independence and safety with mobility to allow discharge to the venue listed below.  Pt was able to ambulate in hallway with RW with minimal complaints of L UE pain, but unable to use it functionally with transfers and re-positioning in bed.  Recommend HHPT.     Follow Up Recommendations Home health PT    Equipment Recommendations  None recommended by PT    Recommendations for Other Services       Precautions / Restrictions        Mobility  Bed Mobility Overal bed mobility: Needs Assistance Bed Mobility: Supine to Sit     Supine to sit: Supervision     General bed mobility comments: cues for technique  Transfers Overall transfer level: Needs assistance Equipment used: Rolling walker (2 wheeled) Transfers: Sit to/from Stand Sit to Stand: Min guard         General transfer comment: pushed with R UE only due to L UE pain  Ambulation/Gait Ambulation/Gait assistance: Min guard Ambulation Distance (Feet): 75 Feet Assistive device: Rolling walker (2 wheeled) Gait Pattern/deviations: Step-through pattern;Decreased step length - right;Decreased step length - left     General Gait Details: Pt able to use L UE on RW and no complaints with gentle WB, but did need to have A to place hand on RW.  Stairs            Wheelchair Mobility    Modified Rankin (Stroke Patients Only)       Balance Overall balance assessment: Needs assistance   Sitting balance-Leahy  Scale: Good       Standing balance-Leahy Scale: Fair Standing balance comment: requires UE support for dynamic activities                             Pertinent Vitals/Pain Pain Assessment: Faces Faces Pain Scale: Hurts even more Pain Location: L arm Pain Descriptors / Indicators: Grimacing Pain Intervention(s): Limited activity within patient's tolerance;Repositioned    Home Living Family/patient expects to be discharged to:: Private residence Living Arrangements: Alone   Type of Home: Independent living facility Home Access: Level entry     Home Layout: One level Home Equipment: Hand held shower head;Walker - 4 wheels;Walker - 2 wheels;Cane - single point;Tub bench;Wheelchair - manual Additional Comments: Aide 7 days/week in the AM    Prior Function Level of Independence: Independent with assistive device(s)         Comments: Amb with rollator, Aide assists with bath, getting dressed, and laundry     Hand Dominance        Extremity/Trunk Assessment   Upper Extremity Assessment Upper Extremity Assessment: Defer to OT evaluation    Lower Extremity Assessment Lower Extremity Assessment: Overall WFL for tasks assessed       Communication      Cognition Arousal/Alertness: Awake/alert Behavior During Therapy: WFL for tasks assessed/performed Overall Cognitive Status: Within Functional Limits for tasks assessed  General Comments: Pt initially very sleepy, but did wake up and was alert      General Comments      Exercises     Assessment/Plan    PT Assessment Patient needs continued PT services  PT Problem List Decreased activity tolerance;Decreased balance;Decreased mobility       PT Treatment Interventions DME instruction;Gait training;Functional mobility training;Therapeutic activities;Therapeutic exercise    PT Goals (Current goals can be found in the Care Plan section)  Acute Rehab PT  Goals Patient Stated Goal: decrease L UE pain PT Goal Formulation: With patient Time For Goal Achievement: 11/27/17 Potential to Achieve Goals: Good    Frequency Min 3X/week   Barriers to discharge        Co-evaluation               AM-PAC PT "6 Clicks" Daily Activity  Outcome Measure Difficulty turning over in bed (including adjusting bedclothes, sheets and blankets)?: A Little Difficulty moving from lying on back to sitting on the side of the bed? : Unable Difficulty sitting down on and standing up from a chair with arms (e.g., wheelchair, bedside commode, etc,.)?: A Little Help needed moving to and from a bed to chair (including a wheelchair)?: A Little Help needed walking in hospital room?: A Little Help needed climbing 3-5 steps with a railing? : A Lot 6 Click Score: 15    End of Session Equipment Utilized During Treatment: Gait belt Activity Tolerance: Patient tolerated treatment well Patient left: in bed;with call bell/phone within reach;with family/visitor present;with nursing/sitter in room Nurse Communication: Mobility status PT Visit Diagnosis: Difficulty in walking, not elsewhere classified (R26.2)    Time: 2778-2423 PT Time Calculation (min) (ACUTE ONLY): 26 min   Charges:   PT Evaluation $PT Eval Low Complexity: 1 Low PT Treatments $Gait Training: 8-22 mins   PT G Codes:        Liboria Putnam L. Katrinka Blazing, [place] Pager [phone] 11/27/2017   Enzo Montgomery 11/27/2017, 3:41 PM

## 2017-11-27 NOTE — Progress Notes (Signed)
Inpatient Diabetes Program Recommendations  AACE/ADA: New Consensus Statement on Inpatient Glycemic Control (2015)  Target Ranges:  Prepandial:   less than 140 mg/dL      Peak postprandial:   less than 180 mg/dL (1-2 hours)      Critically ill patients:  140 - 180 mg/dL   Results for MARKETIA, STALLSMITH (MRN [id number]) as of 11/27/2017 09:07  Ref. Range 11/26/2017 23:10 11/27/2017 08:42  Glucose-Capillary Latest Ref Range: 65 - 99 mg/dL 937 (H) 169 (H)  Results for JAYDALYNN, OLIVERO (MRN [id number]) as of 11/27/2017 09:07  Ref. Range 11/26/2017 22:17  Hemoglobin A1C Latest Ref Range: 4.8 - 5.6 % 5.4   Review of Glycemic Control  Diabetes history: DM2 Outpatient Diabetes medications: Metformin 500 mg QAM Current orders for Inpatient glycemic control: Novolog 0-9 units TID with meals, Novolog 0-5 units QHS; Prednisone 40 mg QAM  NOTE: Noted consult for Diabetes Coordinator per Foot Ulcer protocol. Chart reviewed. Agree with current inpatient DM medication orders at this time.  Thanks, Orlando Penner, RN, MSN, CDE Diabetes Coordinator Inpatient Diabetes Program [phone] (Team Pager from 8am to 5pm)

## 2017-11-27 NOTE — Progress Notes (Signed)
Pharmacy Antibiotic Note  [name] [name] is a 84 y.o. female admitted on 11/26/2017 with wound infection.  Pharmacy has been consulted for vancomycin and cefepime dosing.  Patient received vanc 1g and zosyn 3.375g x 1 in ED today.  Plan: Continue Vancomycin 750mg  IV q24h for AUC goal 400-500. Due to CrCl of < 50 ml/min, change cefepime from 2g IV q12 to 2g IV q24  Height: 5\' 4"  (162.6 cm) Weight: 185 lb 13.6 oz (84.3 kg) IBW/kg (Calculated) : 54.7  Temp (24hrs), Avg:99.7 F (37.6 C), Min:98.4 F (36.9 C), Max:101.5 F (38.6 C)  Recent Labs  Lab 11/26/17 1444 11/26/17 1503 11/26/17 1839 11/27/17 0522  WBC 13.5*  --   --  19.0*  CREATININE 0.76  --   --  0.93  LATICACIDVEN  --  1.50 1.23  --     Estimated Creatinine Clearance: 46.4 mL/min (by C-G formula based on SCr of 0.93 mg/dL).    Allergies  Allergen Reactions  . Clonidine Derivatives Swelling    [name]  . Fish Allergy Diarrhea, Swelling and Other (See Comments)    Turns skin "black," but can tolerate white fish Salmon- Diarrhea  . Shellfish Allergy Hives  . Doxycycline Rash  . Indomethacin     Reaction not recalled by the patient  . Lyrica [Pregabalin]     Hallucinations   . Methyldopa     Aldomet (for hypertension): Reaction not recalled by the patient  . Orange Fruit [Citrus] Other (See Comments)    Indigestion/heartburn  . Cetirizine Hcl Itching and Rash  . Codeine Itching  . Levaquin [Levofloxacin In D5w] Rash  . Tomato Rash    Antimicrobials this admission: 1/18 Zosyn x 1 1/18 Cefepime >> 1/18 Vancomycin >> 1/18 PO Flagyl >>  Dose adjustments this admission:   Microbiology results: 1/18 BCx: 1/18 UCx:  Thank you for allowing pharmacy to be a part of this patient's care.  2/18 11/27/2017 1:21 PM

## 2017-11-27 NOTE — Progress Notes (Signed)
Patient immediately awake after receiving Narcan as per order, RN ordered lunch, got patient up with one assist to bathroom, RN and tech  assisted patient to bathe, changed gown and all linens.  Patient sitting up in chair talking to granddaughter.  Denies any pain at this time other than usual stiffness from her RA.  Will continue to monitor.

## 2017-11-27 NOTE — Progress Notes (Signed)
Rn continues to be unable to awaken patient for any period of time, MD notified, order for NArcan received.

## 2017-11-27 NOTE — Progress Notes (Signed)
PROGRESS NOTE    [name] [name]  IWO:[id number] DOB: 1930/01/22 DOA: 11/26/2017 PCP: Briscoe Deutscher, DO   Brief Narrative: [name] [name] is [name] [name] 84 y.o. female with medical history significant of RA, OA, gout, CAD, CVA, T2DM presenting with joint pain.  She notes her symptoms started this morning.  She notes she woke up with "pain all over".  Pain in her bilateral shoulder,s, elbows, and hands.  She was so painful she couldn't be moved due to the pain.  Similar to previous arthritis flares, but she's never had one like this before.  States it's "10 x worse than RA pain".  She saw rheumatologist yesterday, but not having flare at this point.  She denies fevers, chills, CP, SOB, abdominal pain, numbness, tingling, weakness.  She notes [name] [name] mild cough that has resolved.    She's had lower extremity wounds for ~1.5 years.  Goes every 2 weeks to wound center.  They thought things were getting better.  Daughter thinks wound looks [name] [name] bit worse today.    Assessment & Plan:   Principal Problem:   SIRS (systemic inflammatory response syndrome) (HCC) Active Problems:   Hyperlipidemia   Reactive airway disease   Gout   Fibromyalgia   HTN (hypertension)   CAD (coronary artery disease)   Rheumatoid arthritis (HCC)   Stroke (HCC)   CKD (chronic kidney disease) stage 3, GFR 30-59 ml/min (HCC)   DM (diabetes mellitus), type 2 with renal complications (HCC)   Acute on chronic diastolic heart failure (HCC)   Systemic Inflammatory Response Syndrome, possible sepsis due to infected R lower extremity wounds:  Septic arthritis less likely with synovial fluid.  Pt with fever, tachycardia.  Possible source of infection with RLE wounds vs possible septic joint.  RLE with possibly more purulence than noted previously.  Her presenting sx was polyarticular joint pain.  For now will treat with broad spectrum abx.  This could be related to her inflammatory arthritis.  ESR, CRP both elevated (ESR not elevated to  degree that would typically suggest osteo) Plain film of R tib fib without evidence of osteo  Blood and urine cultures pending Negative influenza Consulted ortho given joint pain, s/p L elbow arthrocentesis -> WBC's ~12,000 per Dr. Erlinda Hong note, suggestive of inflammatory arthritis.  Follow fluid cultures.   Continue vanc/cefepime/flagyl -> will narrow to vanc Lower extremity wound orders -> ABI, A1c 5.4, wound c/s, sw, case management, nutrition  Polyarticular Join Pain  Rheumatoid Arthritis  Gout  OA:  Polyarticular natrue of pain makes me concerned for RA flare or gout flare or other inflammatory cause of her arthritis.  She saw rheum yesterday and was on allopurinol 150 mg daily.  Currently on prednisone 7.5 mg daily.  Also methotrexate once weekly (just took this last night) (was on humira, xeljanz, orencia, and actemra in paset).  .  Follow imaging of bilateral shoulder, elbow, wrists, and hands Ortho c/s for L elbow joint aspiration  Inflammatory markers (ESR and CRP elevated, ANA, antiCCP, RF pending) S/p solumedrol 60 mg x1, now prednisone daily Will need outpatient rheum follow up Scheduled APAP, prn oxy and morphine for breakthrough pain  Acute Encephalopathy: pt [name] [name]&Ox3, but [name] [name] bit confused per daughter.  No focal deficits.  I think this is probably due to pain and possibly infection above.  Will continue to monitor for now, w/u further as indicated.  Check VBG today, received morphine and oxycodone last night, could be playing Di Jasmer role, decreased oxycodone, stopped morphine  HTN:  continue hydral, labetalol.  Holding lasix for now, she takes this QOD and family can't tell me for sure if she's taking it.  Hx CAD  CVA:  No CP or neurologic sx. Continue ASA  T2DM: SSI, hold metformin gabapentin  Hypokalemia: follow mg, replete prn  Leukocytosis: 2/2 infection vs inflammation vs chronic steroid use  Anemia: chronic, ctm  She wasn't sure about advair, lasix, or gabapentin.   Held advair and lasix.  Continued gabapentin.  Asked daughter to check and bring med list.   DVT prophylaxis: lovenox Code Status: DNR Family Communication: daughter at bedside Disposition Plan: pending   Consultants:   none  Procedures:   none  Antimicrobials:  Anti-infectives (From admission, onward)   Start     Dose/Rate Route Frequency Ordered Stop   11/28/17 1000  ceFEPIme (MAXIPIME) 2 g in dextrose 5 % 50 mL IVPB  Status:  Discontinued     2 g 100 mL/hr over 30 Minutes Intravenous Every 24 hours 11/27/17 1322 11/27/17 1751   11/27/17 0000  vancomycin (VANCOCIN) IVPB 1000 mg/200 mL premix  Status:  Discontinued     1,000 mg 200 mL/hr over 60 Minutes Intravenous Every 24 hours 11/26/17 1818 11/26/17 1822   11/27/17 0000  vancomycin (VANCOCIN) IVPB 750 mg/150 ml premix     750 mg 150 mL/hr over 60 Minutes Intravenous Every 24 hours 11/26/17 1822     11/26/17 2200  metroNIDAZOLE (FLAGYL) tablet 500 mg  Status:  Discontinued     500 mg Oral Every 8 hours 11/26/17 2055 11/27/17 1751   11/26/17 2200  ceFEPIme (MAXIPIME) 2 g in dextrose 5 % 50 mL IVPB  Status:  Discontinued     2 g 100 mL/hr over 30 Minutes Intravenous Every 12 hours 11/26/17 1818 11/27/17 1322   11/26/17 1415  piperacillin-tazobactam (ZOSYN) IVPB 3.375 g     3.375 g 100 mL/hr over 30 Minutes Intravenous  Once 11/26/17 1410 11/26/17 1612   11/26/17 1415  vancomycin (VANCOCIN) IVPB 1000 mg/200 mL premix     1,000 mg 200 mL/hr over 60 Minutes Intravenous  Once 11/26/17 1410 11/26/17 1837      Subjective: Sleepy.  Pain is better.  Objective: Vitals:   11/26/17 1700 11/26/17 1730 11/26/17 2055 11/27/17 0537  BP: (!) 163/80 (!) 164/72 (!) 163/63 128/65  Pulse: 99 94 (!) 112 89  Resp: 20 (!) 23 18 18   Temp:   99.1 F (37.3 C) 98.4 F (36.9 C)  TempSrc:   Oral Axillary  SpO2: 100% 98% 99% 99%  Weight:   84.3 kg (185 lb 13.6 oz)   Height:   5' 4"  (1.626 m)     Intake/Output Summary (Last 24 hours)  at 11/27/2017 0933 Last data filed at 11/27/2017 0535 Gross per 24 hour  Intake 250 ml  Output 900 ml  Net -650 ml   Filed Weights   11/26/17 2055  Weight: 84.3 kg (185 lb 13.6 oz)    Examination:  General exam: Appears calm and comfortable, drowsy Respiratory system: Clear to auscultation. Respiratory effort normal. Cardiovascular system: S1 & S2 heard, RRR. No JVD, murmurs, rubs, gallops or clicks. No pedal edema. Gastrointestinal system: Abdomen is nondistended, soft and nontender. No organomegaly or masses felt. Normal bowel sounds heard. Central nervous system: Alert and oriented. CN 2-12 intact.  Moving all extremities.  Very sleepy, falling asleep during our interview.  Extremities: Symmetric 5 x 5 power.  Palpable distal pulses.  Skin: 2 well circumscribed ulcers to RLE  with purulent drainage Psychiatry: Judgement and insight appear normal. Mood & affect appropriate.   1/19   1/18       Data Reviewed: I have personally reviewed following labs and imaging studies  CBC: Recent Labs  Lab 11/26/17 1444 11/27/17 0522  WBC 13.5* 19.0*  NEUTROABS 10.5*  --   HGB 10.5* 10.6*  HCT 30.9* 31.6*  MCV 97.5 97.5  PLT 319 016   Basic Metabolic Panel: Recent Labs  Lab 11/26/17 1444 11/27/17 0522  NA 135 133*  K 2.9* 4.5  CL 102 103  CO2 24 24  GLUCOSE 105* 224*  BUN 21* 19  CREATININE 0.76 0.93  CALCIUM 9.2 8.9  MG 1.2*  --    GFR: Estimated Creatinine Clearance: 46.4 mL/min (by C-G formula based on SCr of 0.93 mg/dL). Liver Function Tests: Recent Labs  Lab 11/26/17 1444 11/27/17 0522  AST 21 20  ALT 13* 13*  ALKPHOS 54 51  BILITOT 0.9 1.0  PROT 6.8 6.7  ALBUMIN 3.4* 3.1*   No results for input(s): LIPASE, AMYLASE in the last 168 hours. No results for input(s): AMMONIA in the last 168 hours. Coagulation Profile: No results for input(s): INR, PROTIME in the last 168 hours. Cardiac Enzymes: No results for input(s): CKTOTAL, CKMB, CKMBINDEX,  TROPONINI in the last 168 hours. BNP (last 3 results) No results for input(s): PROBNP in the last 8760 hours. HbA1C: Recent Labs    11/26/17 2217  HGBA1C 5.4   CBG: Recent Labs  Lab 11/26/17 2310 11/27/17 0842  GLUCAP 166* 201*   Lipid Profile: No results for input(s): CHOL, HDL, LDLCALC, TRIG, CHOLHDL, LDLDIRECT in the last 72 hours. Thyroid Function Tests: No results for input(s): TSH, T4TOTAL, FREET4, T3FREE, THYROIDAB in the last 72 hours. Anemia Panel: No results for input(s): VITAMINB12, FOLATE, FERRITIN, TIBC, IRON, RETICCTPCT in the last 72 hours. Sepsis Labs: Recent Labs  Lab 11/26/17 1503 11/26/17 1839  LATICACIDVEN 1.50 1.23    Recent Results (from the past 240 hour(s))  Body fluid culture     Status: None (Preliminary result)   Collection Time: 11/26/17  7:56 PM  Result Value Ref Range Status   Specimen Description SYNOVIAL LEFT ELBOW  Final   Special Requests NONE  Final   Gram Stain   Final    ABUNDANT WBC PRESENT, PREDOMINANTLY PMN NO ORGANISMS SEEN Performed at [facility], [address] [place], [place] [address]    Culture PENDING  Incomplete   Report Status PENDING  Incomplete         Radiology Studies: Dg Chest 2 View  Result Date: 11/26/2017 CLINICAL DATA:  Fever.  Generalized body pain.  Left arm pain. EXAM: CHEST  2 VIEW COMPARISON:  05/25/2017 FINDINGS: The cardiac silhouette remains moderately enlarged. Thoracic aortic tortuosity is similar to the prior study. There is minimal central pulmonary vascular congestion without evidence of overt edema, airspace consolidation, pleural effusion, or pneumothorax. Advanced degenerative changes are again seen about both shoulders, with postsurgical changes noted on the right. IMPRESSION: Chronic cardiomegaly without evidence of acute abnormality. Electronically Signed   By: Logan Bores [name]   On: 11/26/2017 15:11   Dg Shoulder 1v Left  Result Date: 11/26/2017 CLINICAL DATA:  Severe  shoulder pain EXAM: LEFT SHOULDER - 1 VIEW COMPARISON:  None. FINDINGS: No fracture or dislocation. Marked degenerative change at the [facility] joint. Narrowed subacromial space suggesting rotator cuff disease. Erosions at the distal clavicle. Flattening and sclerosis within the humeral head. IMPRESSION: 1.  Advanced arthritis at the glenohumeral interval and AC joint 2. Erosive change at the distal end of the clavicle, can be seen with rheumatoid. 3. Narrowed subacromial space suggesting rotator cuff pathology. No fracture or dislocation. Electronically Signed   By: Donavan Foil [name]   On: 11/26/2017 19:20   Dg Shoulder Right  Result Date: 11/26/2017 CLINICAL DATA:  Severe shoulder pain EXAM: RIGHT SHOULDER - 2+ VIEW COMPARISON:  None. FINDINGS: Tapering of the distal clavicle likely due to prior surgery. Fixating screws within the acromion. Surgical anchor in the right humeral head. Advanced arthritis of the glenohumeral interval with narrowing, and sclerosis. Flattened appearance of the humeral head with prominent spurring. IMPRESSION: 1. Postsurgical changes of the acromion and humeral head. Suspect prior distal resection of right clavicle 2. No acute fracture or malalignment 3. Severe arthritis of the right glenohumeral interval Electronically Signed   By: Donavan Foil [name]   On: 11/26/2017 19:27   Dg Elbow 2 Views Left  Result Date: 11/26/2017 CLINICAL DATA:  Rheumatoid with severe pain EXAM: LEFT ELBOW - 2 VIEW COMPARISON:  None. FINDINGS: Limited by positioning. No dislocation. Age indeterminate deformity of the radial head. Soft tissue swelling is present. IMPRESSION: Age indeterminate deformity of the radial head. Soft tissue swelling. Electronically Signed   By: Donavan Foil [name]   On: 11/26/2017 19:18   Dg Elbow 2 Views Right  Result Date: 11/26/2017 CLINICAL DATA:  Severe pain EXAM: RIGHT ELBOW - 2 VIEW COMPARISON:  None. FINDINGS: No fracture or malalignment.  No large elbow effusion.  IMPRESSION: No acute osseous abnormality. Electronically Signed   By: Donavan Foil [name]   On: 11/26/2017 19:18   Dg Wrist 2 Views Left  Result Date: 11/26/2017 CLINICAL DATA:  Severe wrist pain EXAM: LEFT WRIST - 2 VIEW COMPARISON:  None. FINDINGS: Diffuse osteopenia. No fracture or dislocation is evident. Severe intercarpal arthritic changes and arthritis at the [facility] joints. Multifocal erosions at the base of the third and fourth metacarpals. Narrowing of the distal radial carpal joint. Os or old fracture adjacent to the ulnar styloid. Chronic deformity of the scaphoid bone with widening of the scaffold lunate interval and proximal migration of the capitate bone. Scattered erosions within the carpal bones along the radial aspect of the wrist. IMPRESSION: 1. Osteopenia with multifocal bony erosions consistent with inflammatory arthropathy. 2. SLAC configuration of the carpal bones, which may also be seen in the setting of rheumatoid arthritis. 3. No fracture is seen. Electronically Signed   By: Donavan Foil [name]   On: 11/26/2017 19:09   Dg Wrist 2 Views Right  Result Date: 11/26/2017 CLINICAL DATA:  Arthritis, wrist pain EXAM: RIGHT WRIST - 2 VIEW COMPARISON:  12/18/2011 FINDINGS: Osteopenia. No acute displaced fracture. Large lucent lesion in the distal radius. Multiple erosions involving base of fourth and fifth metacarpals, and the carpal bones along the ulnar aspect of the wrist. Chronic deformity of the scaphoid. Widened scapholunate interval with proximal migration of the capitate. Marked arthropathy at the radiocarpal interval. Large amount of soft tissue swelling. Mild volar subluxation of the carpal bones with respect to the distal radius on lateral view. IMPRESSION: 1. Large amount of soft tissue swelling at the wrist. No obvious fracture is seen. Mild volar subluxation of carpal bones with respect to the distal radius on lateral view 2. Multiple bony erosions involving the carpal bones and  metacarpals consistent with inflammatory arthritis. SLAC configuration of the wrist which may also be seen in the setting of  rheumatoid. Large lucent lesion in the distal radius, may reflect large subchondral cyst. Electronically Signed   By: Donavan Foil [name]   On: 11/26/2017 19:16   Dg Tibia/fibula Right  Result Date: 11/26/2017 CLINICAL DATA:  Open wound to the lower leg EXAM: RIGHT TIBIA AND FIBULA - 2 VIEW COMPARISON:  05/25/2017 FINDINGS: No fracture or malalignment is seen. Soft tissue swelling. No soft tissue emphysema. Extensive vascular calcifications. Probable dense vascular calcification anterior to the distal tibia. Plantar calcaneal spur. Right knee replacement with normal alignment. Small suprapatellar effusion IMPRESSION: 1. No acute osseous abnormality.  Negative for soft tissue emphysema 2. Right knee replacement.  Small suprapatellar effusion Electronically Signed   By: Donavan Foil [name]   On: 11/26/2017 18:58   Dg Hand 2 View Right  Result Date: 11/26/2017 CLINICAL DATA:  Severe hand pain history of rheumatoid EXAM: RIGHT HAND - 2 VIEW COMPARISON:  None. FINDINGS: Diffuse osteopenia. No fracture is seen. Erosions at the base of the fourth and fifth metacarpals. Soft tissue swelling is present. Flexion at the second through fifth PIP joints. IMPRESSION: 1. No acute osseous abnormality 2. Diffuse osteopenia and bony erosive changes at the base of the metacarpals consistent with erosive/inflammatory arthropathy Electronically Signed   By: Donavan Foil [name]   On: 11/26/2017 19:03   Dg Hand Complete Left  Result Date: 11/26/2017 CLINICAL DATA:  Severe hand pain history of rheumatoid EXAM: LEFT HAND - COMPLETE 3+ VIEW COMPARISON:  None. FINDINGS: Distal third digit is partially obscured by monitoring device. Diffuse osteopenia. No acute fracture or malalignment is seen. Erosive changes at the head of the first metacarpal. Advanced arthritis at the [facility] joints. Probable erosive changes at  the base of the fourth metacarpal. Suspected erosion base of first proximal phalanx. IMPRESSION: Diffuse osteopenia with erosive changes suspected at the base of fourth metacarpal and head of first metacarpal and base of first proximal phalanx, consistent with erosive arthropathy. Marked arthritis at the [facility] joints. Electronically Signed   By: Donavan Foil [name]   On: 11/26/2017 19:01        Scheduled Meds: . acetaminophen  1,000 mg Oral Q8H  . allopurinol  150 mg Oral QPM  . aspirin EC  81 mg Oral Daily  . calcium-vitamin D  1 tablet Oral Daily  . enoxaparin (LOVENOX) injection  40 mg Subcutaneous Q24H  . ferrous sulfate  325 mg Oral Daily  . folic acid  1 mg Oral Daily  . gabapentin  100 mg Oral QHS  . hydrALAZINE  100 mg Oral TID  . insulin aspart  0-5 Units Subcutaneous QHS  . insulin aspart  0-9 Units Subcutaneous TID WC  . labetalol  200 mg Oral BID  . metroNIDAZOLE  500 mg Oral Q8H  . predniSONE  40 mg Oral Daily  . vitamin C  500 mg Oral QODAY   Continuous Infusions: . ceFEPime (MAXIPIME) IV Stopped (11/26/17 2237)  . vancomycin Stopped (11/27/17 0043)     LOS: 1 day    Time spent: over 30 min    Fayrene Helper, MD [facility] Pager [phone]  If 7PM-7AM, please contact night-coverage [email or web address] Password [facility] 11/27/2017, 9:33 AM

## 2017-11-27 NOTE — Progress Notes (Signed)
PT Cancellation Note  Patient Details Name: [name] [name] MRN: [id number] DOB: 02/06/1930   Cancelled Treatment:    Reason Eval/Treat Not Completed: [name] level of consciousness. Pt with decreased level of alertness.  Nursing attending to patient.  Will check back as schedule permits.   Enzo Montgomery 11/27/2017, 11:32 AM

## 2017-11-27 NOTE — Plan of Care (Signed)
Patient stable during 7 a to 7 p shift, sat up in chair, walked hallways and enjoyed spending time with her family.  No complaints of pain this shift.  Eating well.

## 2017-11-28 ENCOUNTER — Encounter (HOSPITAL_COMMUNITY): Payer: Self-pay

## 2017-11-28 ENCOUNTER — Encounter (HOSPITAL_COMMUNITY): Payer: Medicare Other

## 2017-11-28 LAB — COMPREHENSIVE METABOLIC PANEL
ALT: 14 U/L (ref 14–54)
AST: 25 U/L (ref 15–41)
Albumin: 3 g/dL — ABNORMAL LOW (ref 3.5–5.0)
Alkaline Phosphatase: 50 U/L (ref 38–126)
Anion gap: 6 (ref 5–15)
BUN: 33 mg/dL — ABNORMAL HIGH (ref 6–20)
CO2: 21 mmol/L — ABNORMAL LOW (ref 22–32)
Calcium: 8.7 mg/dL — ABNORMAL LOW (ref 8.9–10.3)
Chloride: 107 mmol/L (ref 101–111)
Creatinine, Ser: 1.05 mg/dL — ABNORMAL HIGH (ref 0.44–1.00)
GFR calc Af Amer: 55 mL/min — ABNORMAL LOW (ref >60)
GFR calc non Af Amer: 47 mL/min — ABNORMAL LOW (ref >60)
Glucose, Bld: 132 mg/dL — ABNORMAL HIGH (ref 65–99)
Potassium: 4.5 mmol/L (ref 3.5–5.1)
Sodium: 134 mmol/L — ABNORMAL LOW (ref 135–145)
Total Bilirubin: 0.2 mg/dL — ABNORMAL LOW (ref 0.3–1.2)
Total Protein: 6.4 g/dL — ABNORMAL LOW (ref 6.5–8.1)

## 2017-11-28 LAB — RHEUMATOID FACTOR: Rhuematoid fact SerPl-aCnc: 49.4 IU/mL — ABNORMAL HIGH (ref 0.0–13.9)

## 2017-11-28 LAB — CBC
HCT: 28.5 % — ABNORMAL LOW (ref 36.0–46.0)
Hemoglobin: 9.6 g/dL — ABNORMAL LOW (ref 12.0–15.0)
MCH: 32.7 pg (ref 26.0–34.0)
MCHC: 33.7 g/dL (ref 30.0–36.0)
MCV: 96.9 fL (ref 78.0–100.0)
Platelets: 278 10*3/uL (ref 150–400)
RBC: 2.94 MIL/uL — ABNORMAL LOW (ref 3.87–5.11)
RDW: 15.9 % — ABNORMAL HIGH (ref 11.5–15.5)
WBC: 22.6 10*3/uL — ABNORMAL HIGH (ref 4.0–10.5)

## 2017-11-28 LAB — GLUCOSE, CAPILLARY
Glucose-Capillary: 133 mg/dL — ABNORMAL HIGH (ref 65–99)
Glucose-Capillary: 190 mg/dL — ABNORMAL HIGH (ref 65–99)
Glucose-Capillary: 104 mg/dL — ABNORMAL HIGH (ref 65–99)
Glucose-Capillary: 158 mg/dL — ABNORMAL HIGH (ref 65–99)

## 2017-11-28 LAB — URINE CULTURE

## 2017-11-28 LAB — MAGNESIUM: Magnesium: 2.7 mg/dL — ABNORMAL HIGH (ref 1.7–2.4)

## 2017-11-28 MED ORDER — ZOLPIDEM TARTRATE 5 MG PO TABS
5.0000 mg | ORAL_TABLET | Freq: Once | ORAL | Status: AC
  Administered 2017-11-28: 5 mg via ORAL
  Filled 2017-11-28: qty 1

## 2017-11-28 MED ORDER — LORATADINE 10 MG PO TABS
10.0000 mg | ORAL_TABLET | Freq: Every day | ORAL | Status: DC
  Administered 2017-11-29: 10 mg via ORAL
  Filled 2017-11-28: qty 1

## 2017-11-28 MED ORDER — PREMIER PROTEIN SHAKE
11.0000 [oz_av] | ORAL | Status: DC
  Administered 2017-11-28: 11 [oz_av] via ORAL
  Filled 2017-11-28 (×2): qty 325.31

## 2017-11-28 MED ORDER — VANCOMYCIN HCL IN DEXTROSE 1-5 GM/200ML-% IV SOLN: 1000.0000 mg | INTRAVENOUS | Status: DC

## 2017-11-28 MED ORDER — FUROSEMIDE 20 MG PO TABS
20.0000 mg | ORAL_TABLET | ORAL | Status: DC
  Administered 2017-11-28: 20 mg via ORAL
  Filled 2017-11-28: qty 1

## 2017-11-28 MED ORDER — SULFAMETHOXAZOLE-TRIMETHOPRIM 800-160 MG PO TABS
1.0000 | ORAL_TABLET | Freq: Two times a day (BID) | ORAL | Status: DC
  Administered 2017-11-28 – 2017-11-29 (×2): 1 via ORAL
  Filled 2017-11-28 (×2): qty 1

## 2017-11-28 NOTE — Progress Notes (Signed)
Initial Nutrition Assessment  DOCUMENTATION CODES:   Obesity unspecified  INTERVENTION:   Provide Premier Protein daily, each supplement provides 160 kcal and 30 grams of protein.   NUTRITION DIAGNOSIS:   Increased nutrient needs related to wound healing as evidenced by estimated needs.  GOAL:   Patient will meet greater than or equal to 90% of their needs  MONITOR:   PO intake, Supplement acceptance, Weight trends, Labs, I & O's, Skin  REASON FOR ASSESSMENT:   Consult Wound healing  ASSESSMENT:   84 y.o. female with medical history significant of RA, OA, gout, CAD, CVA, T2DM presenting with joint pain.  Pt on regular diet, no PO recorded. Pt did not eat any breakfast this morning but ate a breakfast-like meal for lunch of eggs and sausage. Pt with no weight loss, weight is stable. Will order Premier Protein for additional protein.  Labs reviewed; CBGs: 133-190 Low Na  Elevated Mg GFR: 55  NUTRITION - FOCUSED PHYSICAL EXAM:    Most Recent Value  Orbital Region  No depletion  Upper Arm Region  No depletion  Thoracic and Lumbar Region  No depletion  Buccal Region  No depletion  Temple Region  No depletion  Clavicle Bone Region  No depletion  Clavicle and Acromion Bone Region  No depletion  Scapular Bone Region  Unable to assess  Dorsal Hand  No depletion  Patellar Region  No depletion  Anterior Thigh Region  No depletion  Posterior Calf Region  Moderate depletion [related to wound]  Edema (RD Assessment)  None       Diet Order:  Diet regular Room service appropriate? Yes; Fluid consistency: Thin  EDUCATION NEEDS:   No education needs have been identified at this time  Skin:  Skin Assessment: Skin Integrity Issues: Skin Integrity Issues:: Other (Comment) Other: non-pressure right leg wound  Last BM:  1/18  Height:   Ht Readings from Last 1 Encounters:  11/26/17 5\' 4"  (1.626 m)    Weight:   Wt Readings from Last 1 Encounters:  11/26/17 185  lb 13.6 oz (84.3 kg)    Ideal Body Weight:  54.5 kg  BMI:  Body mass index is 31.9 kg/m.  Estimated Nutritional Needs:   Kcal:  1600-1800  Protein:  65-75g  Fluid:  1.8L/day  11/28/17, MS, RD, LDN Tilda Franco Inpatient Clinical Dietitian Pager: [phone] After Hours Pager: [phone]

## 2017-11-28 NOTE — Progress Notes (Signed)
Pharmacy Antibiotic Note  [name] [name] is a 84 y.o. female admitted on 11/26/2017 with wound infection.  Pharmacy has been consulted for vancomycin and cefepime dosing.  Patient received vanc 1g and zosyn 3.375g x 1 in ED today.  Plan: Day 3 Abx's - Due to rise in SCr , change vancomycin from 750mg  IV q24h to 1g q36 for AUC goal 400-500. - Discontinue as soon as possible once elbow culture negative   Height: 5\' 4"  (162.6 cm) Weight: 185 lb 13.6 oz (84.3 kg) IBW/kg (Calculated) : 54.7  Temp (24hrs), Avg:98.2 F (36.8 C), Min:98 F (36.7 C), Max:98.5 F (36.9 C)  Recent Labs  Lab 11/26/17 1444 11/26/17 1503 11/26/17 1839 11/27/17 0522 11/28/17 0809  WBC 13.5*  --   --  19.0* 22.6*  CREATININE 0.76  --   --  0.93 1.05*  LATICACIDVEN  --  1.50 1.23  --   --     Estimated Creatinine Clearance: 41.1 mL/min (A) (by C-G formula based on SCr of 1.05 mg/dL (H)).    Allergies  Allergen Reactions  . Clonidine Derivatives Swelling    [name]  . Fish Allergy Diarrhea, Swelling and Other (See Comments)    Turns skin "black," but can tolerate white fish Salmon- Diarrhea  . Shellfish Allergy Hives  . Doxycycline Rash  . Indomethacin     Reaction not recalled by the patient  . Lyrica [Pregabalin]     Hallucinations   . Methyldopa     Aldomet (for hypertension): Reaction not recalled by the patient  . Orange Fruit [Citrus] Other (See Comments)    Indigestion/heartburn  . Cetirizine Hcl Itching and Rash  . Codeine Itching  . Levaquin [Levofloxacin In D5w] Rash  . Tomato Rash    Thank you for allowing pharmacy to be a part of this patient's care.   11/29/17, PharmD, BCPS Pager [phone] 11/28/2017 11:52 AM

## 2017-11-28 NOTE — Progress Notes (Addendum)
PROGRESS NOTE    [name] [name]  MVE:[id number] DOB: 05-25-1930 DOA: 11/26/2017 PCP: Briscoe Deutscher, DO   Brief Narrative: [name] [name] is [name] [name] 84 y.o. female with medical history significant of RA, OA, gout, CAD, CVA, T2DM presenting with joint pain.  She notes her symptoms started this morning.  She notes she woke up with "pain all over".  Pain in her bilateral shoulder,s, elbows, and hands.  She was so painful she couldn't be moved due to the pain.  Similar to previous arthritis flares, but she's never had one like this before.  States it's "10 x worse than RA pain".  She saw rheumatologist yesterday, but not having flare at this point.  She denies fevers, chills, CP, SOB, abdominal pain, numbness, tingling, weakness.  She notes [name] [name] mild cough that has resolved.    She's had lower extremity wounds for ~1.5 years.  Goes every 2 weeks to wound center.  They thought things were getting better.  Daughter thinks wound looks [name] [name] bit worse today.    Assessment & Plan:   Principal Problem:   SIRS (systemic inflammatory response syndrome) (HCC) Active Problems:   Hyperlipidemia   Reactive airway disease   Gout   Fibromyalgia   HTN (hypertension)   CAD (coronary artery disease)   Rheumatoid arthritis (HCC)   Stroke (HCC)   CKD (chronic kidney disease) stage 3, GFR 30-59 ml/min (HCC)   DM (diabetes mellitus), type 2 with renal complications (HCC)   Acute on chronic diastolic heart failure (HCC)   Systemic Inflammatory Response Syndrome, possible sepsis due to infected R lower extremity wounds:  Septic arthritis less likely with synovial fluid.  [name] with fever, tachycardia.  Possible source of infection with RLE wounds vs possible septic joint.  RLE with possibly more purulence than noted previously.  Her presenting sx was polyarticular joint pain.  For now will treat with broad spectrum abx.  This could be related to her inflammatory arthritis.  ESR, CRP both elevated (ESR not elevated to  degree that would typically suggest osteo) Plain film of R tib fib without evidence of osteo  Blood and urine cultures pending Negative influenza Consulted ortho given joint pain, s/p L elbow arthrocentesis -> WBC's ~12,000 per Dr. Erlinda Hong note, suggestive of inflammatory arthritis.  Follow fluid cultures.   Continue vanc/cefepime/flagyl -> will narrow to vanc  Lower extremity wound orders -> ABI, A1c 5.4, wound c/s, sw, case management, nutrition  Polyarticular Join Pain  Rheumatoid Arthritis  Gout  OA:  Polyarticular natrue of pain makes me concerned for RA flare or gout flare or other inflammatory cause of her arthritis.  She saw rheum yesterday and was on allopurinol 150 mg daily.  Currently on prednisone 7.5 mg daily.  Also methotrexate once weekly (just took this last night) (was on humira, xeljanz, orencia, and actemra in paset).  .  Follow imaging of bilateral shoulder, elbow, wrists, and hands Ortho c/s for L elbow joint aspiration (no crystals in synovial fluid) Inflammatory markers (ESR and CRP elevated, ANA, antiCCP, RF pending) S/p solumedrol 60 mg x1, now prednisone daily Will need outpatient rheum follow up Scheduled APAP, prn oxy and morphine for breakthrough pain  Acute Encephalopathy: [name] [name]&Ox3.  Seems much better.  Got narcan yesterday and improved.  Had ambien last night and is sleepy this morning.   HTN: continue hydral, labetalol. Lasix qod.  Hx CAD  CVA:  No CP or neurologic sx. Continue ASA  T2DM: SSI, hold metformin gabapentin  Hypokalemia: follow  mg, replete prn  Leukocytosis: 2/2 infection vs inflammation vs chronic steroid use  Anemia: chronic, ctm  DVT prophylaxis: lovenox Code Status: DNR Family Communication: daughter at bedside Disposition Plan: pending arterial studies, evaluation by wound team   Consultants:   none  Procedures:   none  Antimicrobials:  Anti-infectives (From admission, onward)   Start     Dose/Rate Route Frequency  Ordered Stop   11/28/17 1000  ceFEPIme (MAXIPIME) 2 g in dextrose 5 % 50 mL IVPB  Status:  Discontinued     2 g 100 mL/hr over 30 Minutes Intravenous Every 24 hours 11/27/17 1322 11/27/17 1751   11/27/17 0000  vancomycin (VANCOCIN) IVPB 1000 mg/200 mL premix  Status:  Discontinued     1,000 mg 200 mL/hr over 60 Minutes Intravenous Every 24 hours 11/26/17 1818 11/26/17 1822   11/27/17 0000  vancomycin (VANCOCIN) IVPB 750 mg/150 ml premix     750 mg 150 mL/hr over 60 Minutes Intravenous Every 24 hours 11/26/17 1822     11/26/17 2200  metroNIDAZOLE (FLAGYL) tablet 500 mg  Status:  Discontinued     500 mg Oral Every 8 hours 11/26/17 2055 11/27/17 1751   11/26/17 2200  ceFEPIme (MAXIPIME) 2 g in dextrose 5 % 50 mL IVPB  Status:  Discontinued     2 g 100 mL/hr over 30 Minutes Intravenous Every 12 hours 11/26/17 1818 11/27/17 1322   11/26/17 1415  piperacillin-tazobactam (ZOSYN) IVPB 3.375 g     3.375 g 100 mL/hr over 30 Minutes Intravenous  Once 11/26/17 1410 11/26/17 1612   11/26/17 1415  vancomycin (VANCOCIN) IVPB 1000 mg/200 mL premix     1,000 mg 200 mL/hr over 60 Minutes Intravenous  Once 11/26/17 1410 11/26/17 1837      Subjective: Pain is better.   Objective: Vitals:   11/27/17 1113 11/27/17 1345 11/27/17 2101 11/28/17 0500  BP: (!) 150/55 (!) 115/51 140/77 (!) 157/75  Pulse: 78 79 82 91  Resp:   18 16  Temp:  98 F (36.7 C) 98.5 F (36.9 C) 98 F (36.7 C)  TempSrc:  Oral Oral Axillary  SpO2: 100% 98% 96% 93%  Weight:      Height:        Intake/Output Summary (Last 24 hours) at 11/28/2017 1000 Last data filed at 11/28/2017 0438 Gross per 24 hour  Intake 1845 ml  Output 300 ml  Net 1545 ml   Filed Weights   11/26/17 2055  Weight: 84.3 kg (185 lb 13.6 oz)    Examination:  General: No acute distress. Cardiovascular: Heart sounds show [name] [name] regular rate, and rhythm. No gallops or rubs. No murmurs. No JVD. Lungs: Clear to auscultation bilaterally with good air  movement. No rales, rhonchi or wheezes. Abdomen: Soft, nontender, nondistended with normal active bowel sounds. No masses. No hepatosplenomegaly. Neurological: Alert and oriented 3. Moves all extremities 4. Cranial nerves II through XII grossly intact. MSK: less tenderness through bilateral arms today, better ROM Skin: Warm and dry. No rashes or lesions. Extremities: No clubbing or cyanosis. No edema. Pedal pulses 2+. Psychiatric: Mood and affect are normal. Insight and judgment are appropriate.  1/20    1/19   1/18       Data Reviewed: I have personally reviewed following labs and imaging studies  CBC: Recent Labs  Lab 11/26/17 1444 11/27/17 0522 11/28/17 0809  WBC 13.5* 19.0* 22.6*  NEUTROABS 10.5*  --   --   HGB 10.5* 10.6* 9.6*  HCT 30.9* 31.6*  28.5*  MCV 97.5 97.5 96.9  PLT 319 328 366   Basic Metabolic Panel: Recent Labs  Lab 11/26/17 1444 11/27/17 0522 11/27/17 1416 11/28/17 0809  NA 135 133*  --  134*  K 2.9* 4.5  --  4.5  CL 102 103  --  107  CO2 24 24  --  21*  GLUCOSE 105* 224*  --  132*  BUN 21* 19  --  33*  CREATININE 0.76 0.93  --  1.05*  CALCIUM 9.2 8.9  --  8.7*  MG 1.2* 3.3* 2.8* 2.7*   GFR: Estimated Creatinine Clearance: 41.1 mL/min (Brice Potteiger) (by C-G formula based on SCr of 1.05 mg/dL (H)). Liver Function Tests: Recent Labs  Lab 11/26/17 1444 11/27/17 0522 11/28/17 0809  AST 21 20 25   ALT 13* 13* 14  ALKPHOS 54 51 50  BILITOT 0.9 1.0 0.2*  PROT 6.8 6.7 6.4*  ALBUMIN 3.4* 3.1* 3.0*   No results for input(s): LIPASE, AMYLASE in the last 168 hours. No results for input(s): AMMONIA in the last 168 hours. Coagulation Profile: No results for input(s): INR, PROTIME in the last 168 hours. Cardiac Enzymes: No results for input(s): CKTOTAL, CKMB, CKMBINDEX, TROPONINI in the last 168 hours. BNP (last 3 results) No results for input(s): PROBNP in the last 8760 hours. HbA1C: Recent Labs    11/26/17 2217  HGBA1C 5.4   CBG: Recent  Labs  Lab 11/27/17 0842 11/27/17 1229 11/27/17 1753 11/27/17 2059 11/28/17 0743  GLUCAP 201* 194* 173* 250* 133*   Lipid Profile: No results for input(s): CHOL, HDL, LDLCALC, TRIG, CHOLHDL, LDLDIRECT in the last 72 hours. Thyroid Function Tests: No results for input(s): TSH, T4TOTAL, FREET4, T3FREE, THYROIDAB in the last 72 hours. Anemia Panel: No results for input(s): VITAMINB12, FOLATE, FERRITIN, TIBC, IRON, RETICCTPCT in the last 72 hours. Sepsis Labs: Recent Labs  Lab 11/26/17 1503 11/26/17 1839  LATICACIDVEN 1.50 1.23    Recent Results (from the past 240 hour(s))  Blood Culture (routine x 2)     Status: None (Preliminary result)   Collection Time: 11/26/17  2:47 PM  Result Value Ref Range Status   Specimen Description BLOOD RIGHT ANTECUBITAL  Final   Special Requests   Final    BOTTLES DRAWN AEROBIC AND ANAEROBIC Blood Culture adequate volume   Culture   Final    NO GROWTH < 24 HOURS Performed at [facility], [address], [place], [place] [address]    Report Status PENDING  Incomplete  Blood Culture (routine x 2)     Status: None (Preliminary result)   Collection Time: 11/26/17  2:51 PM  Result Value Ref Range Status   Specimen Description BLOOD RIGHT WRIST  Final   Special Requests   Final    BOTTLES DRAWN AEROBIC AND ANAEROBIC Blood Culture adequate volume   Culture   Final    NO GROWTH < 24 HOURS Performed at [facility], [place] [address], [place], [place] [address]    Report Status PENDING  Incomplete  Body fluid culture     Status: None (Preliminary result)   Collection Time: 11/26/17  7:56 PM  Result Value Ref Range Status   Specimen Description SYNOVIAL LEFT ELBOW  Final   Special Requests NONE  Final   Gram Stain   Final    ABUNDANT WBC PRESENT, PREDOMINANTLY PMN NO ORGANISMS SEEN Performed at [facility], [place] [address], [place], [place] [address]    Culture PENDING  Incomplete  Report Status PENDING  Incomplete          Radiology Studies: Dg Chest 2 View  Result Date: 11/26/2017 CLINICAL DATA:  Fever.  Generalized body pain.  Left arm pain. EXAM: CHEST  2 VIEW COMPARISON:  05/25/2017 FINDINGS: The cardiac silhouette remains moderately enlarged. Thoracic aortic tortuosity is similar to the prior study. There is minimal central pulmonary vascular congestion without evidence of overt edema, airspace consolidation, pleural effusion, or pneumothorax. Advanced degenerative changes are again seen about both shoulders, with postsurgical changes noted on the right. IMPRESSION: Chronic cardiomegaly without evidence of acute abnormality. Electronically Signed   By: Logan Bores [name]   On: 11/26/2017 15:11   Dg Shoulder 1v Left  Result Date: 11/26/2017 CLINICAL DATA:  Severe shoulder pain EXAM: LEFT SHOULDER - 1 VIEW COMPARISON:  None. FINDINGS: No fracture or dislocation. Marked degenerative change at the [facility] joint. Narrowed subacromial space suggesting rotator cuff disease. Erosions at the distal clavicle. Flattening and sclerosis within the humeral head. IMPRESSION: 1. Advanced arthritis at the glenohumeral interval and AC joint 2. Erosive change at the distal end of the clavicle, can be seen with rheumatoid. 3. Narrowed subacromial space suggesting rotator cuff pathology. No fracture or dislocation. Electronically Signed   By: Donavan Foil [name]   On: 11/26/2017 19:20   Dg Shoulder Right  Result Date: 11/26/2017 CLINICAL DATA:  Severe shoulder pain EXAM: RIGHT SHOULDER - 2+ VIEW COMPARISON:  None. FINDINGS: Tapering of the distal clavicle likely due to prior surgery. Fixating screws within the acromion. Surgical anchor in the right humeral head. Advanced arthritis of the glenohumeral interval with narrowing, and sclerosis. Flattened appearance of the humeral head with prominent spurring. IMPRESSION: 1. Postsurgical changes of the acromion and humeral head. Suspect prior distal resection of right clavicle 2. No acute  fracture or malalignment 3. Severe arthritis of the right glenohumeral interval Electronically Signed   By: Donavan Foil [name]   On: 11/26/2017 19:27   Dg Elbow 2 Views Left  Result Date: 11/26/2017 CLINICAL DATA:  Rheumatoid with severe pain EXAM: LEFT ELBOW - 2 VIEW COMPARISON:  None. FINDINGS: Limited by positioning. No dislocation. Age indeterminate deformity of the radial head. Soft tissue swelling is present. IMPRESSION: Age indeterminate deformity of the radial head. Soft tissue swelling. Electronically Signed   By: Donavan Foil [name]   On: 11/26/2017 19:18   Dg Elbow 2 Views Right  Result Date: 11/26/2017 CLINICAL DATA:  Severe pain EXAM: RIGHT ELBOW - 2 VIEW COMPARISON:  None. FINDINGS: No fracture or malalignment.  No large elbow effusion. IMPRESSION: No acute osseous abnormality. Electronically Signed   By: Donavan Foil [name]   On: 11/26/2017 19:18   Dg Wrist 2 Views Left  Result Date: 11/26/2017 CLINICAL DATA:  Severe wrist pain EXAM: LEFT WRIST - 2 VIEW COMPARISON:  None. FINDINGS: Diffuse osteopenia. No fracture or dislocation is evident. Severe intercarpal arthritic changes and arthritis at the [facility] joints. Multifocal erosions at the base of the third and fourth metacarpals. Narrowing of the distal radial carpal joint. Os or old fracture adjacent to the ulnar styloid. Chronic deformity of the scaphoid bone with widening of the scaffold lunate interval and proximal migration of the capitate bone. Scattered erosions within the carpal bones along the radial aspect of the wrist. IMPRESSION: 1. Osteopenia with multifocal bony erosions consistent with inflammatory arthropathy. 2. SLAC configuration of the carpal bones, which may also be seen in the setting of rheumatoid arthritis. 3. No fracture is seen. Electronically  Signed   By: Donavan Foil [name]   On: 11/26/2017 19:09   Dg Wrist 2 Views Right  Result Date: 11/26/2017 CLINICAL DATA:  Arthritis, wrist pain EXAM: RIGHT WRIST - 2 VIEW  COMPARISON:  12/18/2011 FINDINGS: Osteopenia. No acute displaced fracture. Large lucent lesion in the distal radius. Multiple erosions involving base of fourth and fifth metacarpals, and the carpal bones along the ulnar aspect of the wrist. Chronic deformity of the scaphoid. Widened scapholunate interval with proximal migration of the capitate. Marked arthropathy at the radiocarpal interval. Large amount of soft tissue swelling. Mild volar subluxation of the carpal bones with respect to the distal radius on lateral view. IMPRESSION: 1. Large amount of soft tissue swelling at the wrist. No obvious fracture is seen. Mild volar subluxation of carpal bones with respect to the distal radius on lateral view 2. Multiple bony erosions involving the carpal bones and metacarpals consistent with inflammatory arthritis. SLAC configuration of the wrist which may also be seen in the setting of rheumatoid. Large lucent lesion in the distal radius, may reflect large subchondral cyst. Electronically Signed   By: Donavan Foil [name]   On: 11/26/2017 19:16   Dg Tibia/fibula Right  Result Date: 11/26/2017 CLINICAL DATA:  Open wound to the lower leg EXAM: RIGHT TIBIA AND FIBULA - 2 VIEW COMPARISON:  05/25/2017 FINDINGS: No fracture or malalignment is seen. Soft tissue swelling. No soft tissue emphysema. Extensive vascular calcifications. Probable dense vascular calcification anterior to the distal tibia. Plantar calcaneal spur. Right knee replacement with normal alignment. Small suprapatellar effusion IMPRESSION: 1. No acute osseous abnormality.  Negative for soft tissue emphysema 2. Right knee replacement.  Small suprapatellar effusion Electronically Signed   By: Donavan Foil [name]   On: 11/26/2017 18:58   Dg Hand 2 View Right  Result Date: 11/26/2017 CLINICAL DATA:  Severe hand pain history of rheumatoid EXAM: RIGHT HAND - 2 VIEW COMPARISON:  None. FINDINGS: Diffuse osteopenia. No fracture is seen. Erosions at the base of the  fourth and fifth metacarpals. Soft tissue swelling is present. Flexion at the second through fifth PIP joints. IMPRESSION: 1. No acute osseous abnormality 2. Diffuse osteopenia and bony erosive changes at the base of the metacarpals consistent with erosive/inflammatory arthropathy Electronically Signed   By: Donavan Foil [name]   On: 11/26/2017 19:03   Dg Hand Complete Left  Result Date: 11/26/2017 CLINICAL DATA:  Severe hand pain history of rheumatoid EXAM: LEFT HAND - COMPLETE 3+ VIEW COMPARISON:  None. FINDINGS: Distal third digit is partially obscured by monitoring device. Diffuse osteopenia. No acute fracture or malalignment is seen. Erosive changes at the head of the first metacarpal. Advanced arthritis at the [facility] joints. Probable erosive changes at the base of the fourth metacarpal. Suspected erosion base of first proximal phalanx. IMPRESSION: Diffuse osteopenia with erosive changes suspected at the base of fourth metacarpal and head of first metacarpal and base of first proximal phalanx, consistent with erosive arthropathy. Marked arthritis at the [facility] joints. Electronically Signed   By: Donavan Foil [name]   On: 11/26/2017 19:01        Scheduled Meds: . acetaminophen  1,000 mg Oral Q8H  . allopurinol  150 mg Oral QPM  . aspirin EC  81 mg Oral Daily  . calcium-vitamin D  1 tablet Oral Daily  . enoxaparin (LOVENOX) injection  40 mg Subcutaneous Q24H  . ferrous sulfate  325 mg Oral Daily  . folic acid  1 mg Oral Daily  . gabapentin  100 mg Oral QHS  . hydrALAZINE  100 mg Oral TID  . insulin aspart  0-5 Units Subcutaneous QHS  . insulin aspart  0-9 Units Subcutaneous TID WC  . labetalol  200 mg Oral BID  . predniSONE  40 mg Oral Daily  . vitamin C  500 mg Oral QODAY   Continuous Infusions: . vancomycin Stopped (11/28/17 0114)     LOS: 2 days    Time spent: over 65 min    Fayrene Helper, MD [facility] Pager [phone]  If 7PM-7AM, please contact  night-coverage [email or web address] Password [email or web address] 11/28/2017, 10:00 AM

## 2017-11-29 ENCOUNTER — Inpatient Hospital Stay (HOSPITAL_COMMUNITY): Payer: Medicare Other

## 2017-11-29 DIAGNOSIS — L0390 Cellulitis, unspecified: Secondary | ICD-10-CM

## 2017-11-29 LAB — BASIC METABOLIC PANEL
Anion gap: 7 (ref 5–15)
BUN: 40 mg/dL — ABNORMAL HIGH (ref 6–20)
CO2: 22 mmol/L (ref 22–32)
Calcium: 9 mg/dL (ref 8.9–10.3)
Chloride: 107 mmol/L (ref 101–111)
Creatinine, Ser: 1.14 mg/dL — ABNORMAL HIGH (ref 0.44–1.00)
GFR calc Af Amer: 49 mL/min — ABNORMAL LOW (ref >60)
GFR calc non Af Amer: 43 mL/min — ABNORMAL LOW (ref >60)
Glucose, Bld: 136 mg/dL — ABNORMAL HIGH (ref 65–99)
Potassium: 4.7 mmol/L (ref 3.5–5.1)
Sodium: 136 mmol/L (ref 135–145)

## 2017-11-29 LAB — CBC
HCT: 28.5 % — ABNORMAL LOW (ref 36.0–46.0)
Hemoglobin: 9.6 g/dL — ABNORMAL LOW (ref 12.0–15.0)
MCH: 33 pg (ref 26.0–34.0)
MCHC: 33.7 g/dL (ref 30.0–36.0)
MCV: 97.9 fL (ref 78.0–100.0)
Platelets: 306 10*3/uL (ref 150–400)
RBC: 2.91 MIL/uL — ABNORMAL LOW (ref 3.87–5.11)
RDW: 16 % — ABNORMAL HIGH (ref 11.5–15.5)
WBC: 20.3 10*3/uL — ABNORMAL HIGH (ref 4.0–10.5)

## 2017-11-29 LAB — GLUCOSE, CAPILLARY
Glucose-Capillary: 116 mg/dL — ABNORMAL HIGH (ref 65–99)
Glucose-Capillary: 123 mg/dL — ABNORMAL HIGH (ref 65–99)

## 2017-11-29 LAB — ANA W/REFLEX IF POSITIVE: Anti Nuclear Antibody(ANA): NEGATIVE

## 2017-11-29 LAB — CYCLIC CITRUL PEPTIDE ANTIBODY, IGG/IGA: CCP Antibodies IgG/IgA: >250 units — ABNORMAL HIGH (ref 0–19)

## 2017-11-29 MED ORDER — PREDNISONE 20 MG PO TABS: ORAL_TABLET | ORAL | 0 refills | Status: AC

## 2017-11-29 MED ORDER — ALLOPURINOL 300 MG PO TABS: 150.0000 mg | ORAL_TABLET | Freq: Every evening | ORAL | 0 refills | Status: DC

## 2017-11-29 MED ORDER — SULFAMETHOXAZOLE-TRIMETHOPRIM 800-160 MG PO TABS: 1.0000 | ORAL_TABLET | Freq: Two times a day (BID) | ORAL | 0 refills | Status: AC

## 2017-11-29 MED ORDER — COLLAGENASE 250 UNIT/GM EX OINT
TOPICAL_OINTMENT | Freq: Every day | CUTANEOUS | Status: DC
  Administered 2017-11-29: 13:00:00 via TOPICAL
  Filled 2017-11-29: qty 90

## 2017-11-29 NOTE — Progress Notes (Signed)
ABI's have been completed. Right Non-compressible Left 0.76  11/29/17 9:09 AM Olen Cordial RVT

## 2017-11-29 NOTE — Discharge Summary (Signed)
Physician Discharge Summary  SUMAYAH BEARSE EKC:[id number] DOB: 01-Jan-1930 DOA: 11/26/2017  PCP: Briscoe Deutscher, DO  Admit date: 11/26/2017 Discharge date: 11/29/2017  Time spent: over 30 minutes  Recommendations for Outpatient Follow-up:  1. Follow up outpatient CBC/CMP 2. Follow up symptoms on steroid taper 3. Follow up final joint fluid culture and blood cultures 4. Ensure follow up with rheumatology 5. Patient will need to follow up with vascular for abnormal ABI's 6. Follow up RLE wounds, discharged with 3 days abx, reevaluate to consider whether patient needs longer course, but suspect her presentation and symptoms were related to RA flare as opposed to sepsis due to wound infection 7. Follow up blood sugars on steroid taper 8. Follow up imaging below 9. AntiCCP pending at d/c  Discharge Diagnoses:  Principal Problem:   SIRS (systemic inflammatory response syndrome) (HCC) Active Problems:   Hyperlipidemia   Reactive airway disease   Gout   Fibromyalgia   HTN (hypertension)   CAD (coronary artery disease)   Rheumatoid arthritis (HCC)   Stroke (HCC)   CKD (chronic kidney disease) stage 3, GFR 30-59 ml/min (HCC)   DM (diabetes mellitus), type 2 with renal complications ([place])   Acute on chronic diastolic heart failure ([place])   Discharge Condition: stable  Diet recommendation: heart healthy  Filed Weights   11/26/17 2055  Weight: 84.3 kg (185 lb 13.6 oz)    History of present illness:  Kaysey C Williamsis Garrit Marrow 84 y.o.femalewith medical history significant ofRA, OA, gout, CAD, CVA, T2DM presenting with joint pain.  She notes her symptoms started this morning. She notes she woke up with "pain all over". Pain in her bilateral shoulder,s, elbows, and hands. She was so painful she couldn't be moved due to the pain. Similar to previous arthritis flares, but she's never had one like this before. States it's "10 x worse than RA pain". She saw rheumatologist yesterday,  but not having flare at this point. She denies fevers, chills, CP, SOB, abdominal pain, numbness, tingling, weakness. She notes Lemar Bakos mild cough that has resolved.   She's had lower extremity wounds for ~1.5 years. Goes every 2 weeks to wound center. They thought things were getting better. Daughter thinks wound looks Jena Tegeler bit worse today.   Hospital Course:  Systemic Inflammatory Response Syndrome, possible sepsis due to infected R lower extremity wounds:  Septic arthritis less likely with synovial fluid (WBC's ~12,000 suggesting inflammatory arthritis). Pt presented with fever, tachycardia. Possible source of infection with RLE wounds vs possible septic joint.  Also on ddx was RA flare as noted below. RLE with possibly more purulence than noted previously. Her presenting sx was polyarticular joint pain. For now will treat with broad spectrum abx. This could be related to her inflammatory arthritis. ESR, CRP both elevated (ESR not elevated to degree that would typically suggest osteo) Plain film of R tib fib without evidence of osteo  Blood cx NGTD x3 days and urine cultures multiple species Synovial fluid cx NGTD x 2 days Negative influenza Consulted ortho given joint pain, s/p L elbow arthrocentesis -> WBC's ~12,000 per Dr. Erlinda Hong note, suggestive of inflammatory arthritis.  Follow fluid cultures.   Continue vanc/cefepime/flagyl -> narrowed to vanc and then discharged with bactrim  Lower extremity wound orders -> ABI, A1c 5.4, wound c/s (recommendations as noted below), sw, case management, nutrition  Peripheral Arterial Disease: R ABI with noncompressible RLE arteries.  L ABI with moderate LE arterial disease.  Discussed with vascular on call who will help arrange  follow up.  84 has palpable distal pulses.   Polyarticular Join Pain  Rheumatoid Arthritis  Gout  OA: Suspect presentation at least in part with RA flare.  Polyarticular natrue of pain makes me concerned for RA flare or gout  flare or other inflammatory cause of her arthritis. She saw rheum yesterday and was on allopurinol 150 mg daily. Currently on prednisone 7.5 mg daily. Also methotrexate once weekly (just took this last night)(was on humira, xeljanz, orencia, and actemra in paset). .  Follow imaging of bilateral shoulder, elbow, wrists, and hands (see reports, overall notable for findings c/w erosive/inflammatory arthritis) Ortho c/s for L elbow joint aspiration (no crystals in synovial fluid, 12,000 WBC, neutrophil predominant) Inflammatory markers (ESR and CRP elevated, ANA negative, antiCCP pending, RF elevated) S/p solumedrol 60 mg x1, now prednisone daily - discharged with taper Will need outpatient rheum follow up Scheduled APAP, prn oxy and morphine for breakthrough pain  Acute Encephalopathy:pt Ellen Mayol&Ox3.  Seems much better.  Got narcan and improved.    TAE:WYBRKVTX hydral, labetalol. Lasix qod.  Hx CAD  CVA: No CP or neurologic sx. Continue ASA  T2DM: SSI, hold metformin gabapentin  Hypokalemia: follow mg, replete prn  Leukocytosis: 2/2 infection vs inflammation vs chronic steroid use  Anemia: chronic, ctm  Procedures: L elbow aspiration 1/18 1/21 ABI Final Interpretation: Right: Resting right ankle-brachial index indicates noncompressible right lower extremity arteries.The right toe-brachial index is abnormal. Left: Resting left ankle-brachial index indicates moderate left lower extremity arterial disease. The left toe-brachial index is abnormal.  Consultations:  orthopedics  Discharge Exam: Vitals:   11/28/17 1229 11/29/17 0616  BP: (!) 147/69 (!) 171/100  Pulse: 92 89  Resp:  18  Temp: 98.4 F (36.9 C) 98.3 F (36.8 C)  SpO2: 98% 100%   Feeling well.  General: No acute distress. Cardiovascular: Heart sounds show Marquist Binstock regular rate, and rhythm. No gallops or rubs. No murmurs. No JVD. Lungs: Clear to auscultation bilaterally with good air movement. No rales, rhonchi  or wheezes. Abdomen: Soft, nontender, nondistended with normal active bowel sounds. No masses. No hepatosplenomegaly. Neurological: Alert and oriented 3. Moves all extremities 4 with equal strength. Cranial nerves II through XII grossly intact. MSK: no TTP of bilateral shoulders, elbows, wrists, fingers Skin: well circumscribed RLE ulcers, santyl on wound base Extremities: No clubbing or cyanosis. No edema. Pedal pulses 2+. Psychiatric: Mood and affect are normal. Insight and judgment are appropriate.  Discharge Instructions   Discharge Instructions    Call MD for:  difficulty breathing, headache or visual disturbances   Complete by:  As directed    Call MD for:  extreme fatigue   Complete by:  As directed    Call MD for:  persistant dizziness or light-headedness   Complete by:  As directed    Call MD for:  persistant nausea and vomiting   Complete by:  As directed    Call MD for:  severe uncontrolled pain   Complete by:  As directed    Call MD for:  temperature >100.4   Complete by:  As directed    Diet - low sodium heart healthy   Complete by:  As directed    Discharge instructions   Complete by:  As directed    You were seen for Kaiana Marion rheumatoid arthritis flare.  Your symptoms improved with steroids.  Please continue the steroid taper as prescribed.  After you complete the taper, resume your previous dose of steroid (7.5 mg daily).  Please call  your rheumatologist to schedule Kameelah Minish follow up appointment within the next week.  Please call your PCP to schedule an appointment for follow up within the next week.  You were also seen for concern for infection to your right leg wounds.  Please continue your antibiotics for another 3 days and follow up with your PCP and/or wound doctor to determine if these need to be continued.  Please follow the wound care instructions included.  Your arterial tests were abnormal.  You should get Jisele Price phone call from the vascular physicians office to schedule an  appointment to follow up with them.  Return if you have new, worsening, or recurrent symptoms.  Please have your PCP request records from this hospitalization so they know what was done and what the next steps are.  Watch your blood sugars closely while you're on steroids, your medications may need to be adjusted.   Discharge wound care:   Complete by:  As directed    Wound care to two full thickness wounds on the RLE (chronic, non-healing):  Cleanse with normal saline< pat gently dry.  Cover with 1/8 inch thick layer of collagenase (Sanatyl).  Top with saline moistened gauze 2x2s, cover with dry gauze.  Wrap with Kerlix roll gauze from toe to knee, top with 6-inch ACE bandage wrapped in Javarion Douty similar fashion. Change daily.   Increase activity slowly   Complete by:  As directed      Allergies as of 11/29/2017      Reactions   Clonidine Derivatives Swelling   [name]   Fish Allergy Diarrhea, Swelling, Other (See Comments)   Turns skin "black," but can tolerate white fish Salmon- Diarrhea   Shellfish Allergy Hives   Doxycycline Rash   Indomethacin    Reaction not recalled by the patient   Lyrica [pregabalin]    Hallucinations   Methyldopa    Aldomet (for hypertension): Reaction not recalled by the patient   [employer] [citrus] Other (See Comments)   Indigestion/heartburn   Cetirizine Hcl Itching, Rash   Codeine Itching   Levaquin [levofloxacin In D5w] Rash   Tomato Rash      Medication List    TAKE these medications   ADVAIR DISKUS 250-50 MCG/DOSE Aepb Generic drug:  Fluticasone-Salmeterol Inhale 1 puff into the lungs 2 (two) times daily.   albuterol 108 (90 Base) MCG/ACT inhaler Commonly known as:  PROVENTIL HFA;VENTOLIN HFA Inhale 1 puff into the lungs every 6 (six) hours as needed for wheezing or shortness of breath.   ALIVE ONCE DAILY WOMENS PO Take 15 mLs by mouth every other day.   allopurinol 300  MG tablet Commonly known as:  ZYLOPRIM Take 0.5 tablets (150 mg total) by mouth every evening. What changed:    medication strength  how much to take  how to take this  when to take this  additional instructions   aspirin EC 81 MG tablet Take 81 mg by mouth daily.   blood glucose meter kit and supplies Kit Dispense based on patient and insurance preference. Use up to four times daily as directed. (FOR ICD-9 250.00, 250.01).   calcium-vitamin D 500-200 MG-UNIT tablet Commonly known as:  OSCAL WITH D Take 1 tablet by mouth daily.   CHLOROPHYLL PO Take 5 drops by mouth See admin instructions. MIX INTO 6 OUNCES OF JUICE AND DRINK ONCE Bryceton Hantz DAY   ferrous sulfate 325 (65 FE) MG tablet Take 325 mg by mouth daily.  folic acid 1 MG tablet Commonly known as:  FOLVITE Take 1 tablet (1 mg total) by mouth daily.   furosemide 20 MG tablet Commonly known as:  LASIX Take 1 tablet (20 mg total) by mouth every other day.   gabapentin 100 MG capsule Commonly known as:  NEURONTIN Take 1 capsule (100 mg total) at bedtime by mouth.   hydrALAZINE 100 MG tablet Commonly known as:  APRESOLINE take 1 tablet by mouth three times Alison Kubicki day   hydroxypropyl methylcellulose / hypromellose 2.5 % ophthalmic solution Commonly known as:  ISOPTO TEARS / GONIOVISC Place 1 drop into both eyes 4 (four) times daily as needed for dry eyes.   ketoconazole 2 % cream Commonly known as:  NIZORAL Apply 1 application topically 2 (two) times daily.   labetalol 100 MG tablet Commonly known as:  NORMODYNE take 2 tablets by mouth twice Eutimio Gharibian day   metFORMIN 500 MG tablet Commonly known as:  GLUCOPHAGE take 1 tablet by mouth once daily for diabetes What changed:    how much to take  how to take this  when to take this  additional instructions   methotrexate 2.5 MG tablet Commonly known as:  RHEUMATREX Take 10 mg by mouth once Setsuko Robins week.   oxycodone 5 MG capsule Commonly known as:  OXY-IR Take 5 mg by  mouth every 4 (four) hours as needed for pain.   predniSONE 5 MG tablet Commonly known as:  DELTASONE take 1 tablet by mouth once daily WITH BREAKFAST What changed:  Another medication with the same name was added. Make sure you understand how and when to take each.   predniSONE 20 MG tablet Commonly known as:  DELTASONE Take 2 tablets (40 mg total) by mouth daily for 3 days, THEN 1.5 tablets (30 mg total) daily for 3 days, THEN 1 tablet (20 mg total) daily for 3 days, THEN 0.5 tablets (10 mg total) daily for 3 days. Start taking on:  11/30/2017 What changed:  You were already taking Willo Yoon medication with the same name, and this prescription was added. Make sure you understand how and when to take each.   SANTYL ointment Generic drug:  collagenase Apply 1 application topically daily. AS DIRECTED   sulfamethoxazole-trimethoprim 800-160 MG tablet Commonly known as:  BACTRIM DS,SEPTRA DS Take 1 tablet by mouth every 12 (twelve) hours for 3 days.   traMADol 50 MG tablet Commonly known as:  ULTRAM Take 1-2 tablets every 6 hours as needed for pain   triamcinolone ointment 0.5 % Commonly known as:  KENALOG Apply 1 application 2 (two) times daily topically.   vitamin C 500 MG tablet Commonly known as:  ASCORBIC ACID Take 500 mg by mouth every other day.            Discharge Care Instructions  (From admission, onward)        Start     Ordered   11/29/17 0000  Discharge wound care:    Comments:  Wound care to two full thickness wounds on the RLE (chronic, non-healing):  Cleanse with normal saline< pat gently dry.  Cover with 1/8 inch thick layer of collagenase (Sanatyl).  Top with saline moistened gauze 2x2s, cover with dry gauze.  Wrap with Kerlix roll gauze from toe to knee, top with 6-inch ACE bandage wrapped in Dwight Adamczak similar fashion. Change daily.   11/29/17 1249     Allergies  Allergen Reactions  . Clonidine Derivatives Swelling    [name] reports patient's tongue was  swollen and patient hallucinated  . Fish Allergy Diarrhea, Swelling and Other (See Comments)    Turns skin "black," but can tolerate white fish Salmon- Diarrhea  . Shellfish Allergy Hives  . Doxycycline Rash  . Indomethacin     Reaction not recalled by the patient  . Lyrica [Pregabalin]     Hallucinations   . Methyldopa     Aldomet (for hypertension): Reaction not recalled by the patient  . [employer] [Citrus] Other (See Comments)    Indigestion/heartburn  . Cetirizine Hcl Itching and Rash  . Codeine Itching  . Levaquin [Levofloxacin In D5w] Rash  . Tomato Rash   Follow-up Information    Briscoe Deutscher, DO Follow up.   Specialty:  Family Medicine Why:  Call for follow up within 1 week Contact information: [address] [place] [address] [phone]        [facility] Follow up.   Specialty:  Vascular Surgery Why:  Call if you don't hear from the vascular and vein specialists office within 1 week for Keisha Amer follow up appointment.  Contact information: [place] [phone]       Wendall Mola, MD Follow up.   Specialty:  Internal Medicine Why:  Call for Maribel Hadley follow up appointment for your arthritis Contact information: [place] [address] [phone]            The results of significant diagnostics from this hospitalization (including imaging, microbiology, ancillary and laboratory) are listed below for reference.    Significant Diagnostic Studies: Dg Chest 2 View  Result Date: 11/26/2017 CLINICAL DATA:  Fever.  Generalized body pain.  Left arm pain. EXAM: CHEST  2 VIEW COMPARISON:  05/25/2017 FINDINGS: The cardiac silhouette remains moderately enlarged. Thoracic aortic tortuosity is similar to the prior study. There is minimal central pulmonary vascular congestion without evidence of overt edema, airspace consolidation, pleural effusion, or pneumothorax.  Advanced degenerative changes are again seen about both shoulders, with postsurgical changes noted on the right. IMPRESSION: Chronic cardiomegaly without evidence of acute abnormality. Electronically Signed   By: Logan Bores [name]   On: 11/26/2017 15:11   Dg Shoulder 1v Left  Result Date: 11/26/2017 CLINICAL DATA:  Severe shoulder pain EXAM: LEFT SHOULDER - 1 VIEW COMPARISON:  None. FINDINGS: No fracture or dislocation. Marked degenerative change at the [facility] joint. Narrowed subacromial space suggesting rotator cuff disease. Erosions at the distal clavicle. Flattening and sclerosis within the humeral head. IMPRESSION: 1. Advanced arthritis at the glenohumeral interval and AC joint 2. Erosive change at the distal end of the clavicle, can be seen with rheumatoid. 3. Narrowed subacromial space suggesting rotator cuff pathology. No fracture or dislocation. Electronically Signed   By: Donavan Foil [name]   On: 11/26/2017 19:20   Dg Shoulder Right  Result Date: 11/26/2017 CLINICAL DATA:  Severe shoulder pain EXAM: RIGHT SHOULDER - 2+ VIEW COMPARISON:  None. FINDINGS: Tapering of the distal clavicle likely due to prior surgery. Fixating screws within the acromion. Surgical anchor in the right humeral head. Advanced arthritis of the glenohumeral interval with narrowing, and sclerosis. Flattened appearance of the humeral head with prominent spurring. IMPRESSION: 1. Postsurgical changes of the acromion and humeral head. Suspect prior distal resection of right clavicle 2. No acute fracture or malalignment 3. Severe arthritis of the right glenohumeral interval Electronically Signed   By: Donavan Foil [name]   On: 11/26/2017 19:27   Dg Elbow 2 Views Left  Result Date: 11/26/2017 CLINICAL DATA:  Rheumatoid with severe pain EXAM: LEFT ELBOW - 2 VIEW COMPARISON:  None. FINDINGS: Limited by positioning. No dislocation. Age indeterminate deformity of the radial head. Soft tissue swelling is present. IMPRESSION: Age  indeterminate deformity of the radial head. Soft tissue swelling. Electronically Signed   By: Donavan Foil [name]   On: 11/26/2017 19:18   Dg Elbow 2 Views Right  Result Date: 11/26/2017 CLINICAL DATA:  Severe pain EXAM: RIGHT ELBOW - 2 VIEW COMPARISON:  None. FINDINGS: No fracture or malalignment.  No large elbow effusion. IMPRESSION: No acute osseous abnormality. Electronically Signed   By: Donavan Foil [name]   On: 11/26/2017 19:18   Dg Wrist 2 Views Left  Result Date: 11/26/2017 CLINICAL DATA:  Severe wrist pain EXAM: LEFT WRIST - 2 VIEW COMPARISON:  None. FINDINGS: Diffuse osteopenia. No fracture or dislocation is evident. Severe intercarpal arthritic changes and arthritis at the [facility] joints. Multifocal erosions at the base of the third and fourth metacarpals. Narrowing of the distal radial carpal joint. Os or old fracture adjacent to the ulnar styloid. Chronic deformity of the scaphoid bone with widening of the scaffold lunate interval and proximal migration of the capitate bone. Scattered erosions within the carpal bones along the radial aspect of the wrist. IMPRESSION: 1. Osteopenia with multifocal bony erosions consistent with inflammatory arthropathy. 2. SLAC configuration of the carpal bones, which may also be seen in the setting of rheumatoid arthritis. 3. No fracture is seen. Electronically Signed   By: Donavan Foil [name]   On: 11/26/2017 19:09   Dg Wrist 2 Views Right  Result Date: 11/26/2017 CLINICAL DATA:  Arthritis, wrist pain EXAM: RIGHT WRIST - 2 VIEW COMPARISON:  12/18/2011 FINDINGS: Osteopenia. No acute displaced fracture. Large lucent lesion in the distal radius. Multiple erosions involving base of fourth and fifth metacarpals, and the carpal bones along the ulnar aspect of the wrist. Chronic deformity of the scaphoid. Widened scapholunate interval with proximal migration of the capitate. Marked arthropathy at the radiocarpal interval. Large amount of soft tissue swelling. Mild volar  subluxation of the carpal bones with respect to the distal radius on lateral view. IMPRESSION: 1. Large amount of soft tissue swelling at the wrist. No obvious fracture is seen. Mild volar subluxation of carpal bones with respect to the distal radius on lateral view 2. Multiple bony erosions involving the carpal bones and metacarpals consistent with inflammatory arthritis. SLAC configuration of the wrist which may also be seen in the setting of rheumatoid. Large lucent lesion in the distal radius, may reflect large subchondral cyst. Electronically Signed   By: Donavan Foil [name]   On: 11/26/2017 19:16   Dg Tibia/fibula Right  Result Date: 11/26/2017 CLINICAL DATA:  Open wound to the lower leg EXAM: RIGHT TIBIA AND FIBULA - 2 VIEW COMPARISON:  05/25/2017 FINDINGS: No fracture or malalignment is seen. Soft tissue swelling. No soft tissue emphysema. Extensive vascular calcifications. Probable dense vascular calcification anterior to the distal tibia. Plantar calcaneal spur. Right knee replacement with normal alignment. Small suprapatellar effusion IMPRESSION: 1. No acute osseous abnormality.  Negative for soft tissue emphysema 2. Right knee replacement.  Small suprapatellar effusion Electronically Signed   By: Donavan Foil [name]   On: 11/26/2017 18:58   Dg Hand 2 View Right  Result Date: 11/26/2017 CLINICAL DATA:  Severe hand pain history of rheumatoid EXAM: RIGHT HAND - 2 VIEW COMPARISON:  None. FINDINGS: Diffuse osteopenia. No fracture is seen. Erosions at the base of  the fourth and fifth metacarpals. Soft tissue swelling is present. Flexion at the second through fifth PIP joints. IMPRESSION: 1. No acute osseous abnormality 2. Diffuse osteopenia and bony erosive changes at the base of the metacarpals consistent with erosive/inflammatory arthropathy Electronically Signed   By: Donavan Foil [name]   On: 11/26/2017 19:03   Dg Hand Complete Left  Result Date: 11/26/2017 CLINICAL DATA:  Severe hand pain history  of rheumatoid EXAM: LEFT HAND - COMPLETE 3+ VIEW COMPARISON:  None. FINDINGS: Distal third digit is partially obscured by monitoring device. Diffuse osteopenia. No acute fracture or malalignment is seen. Erosive changes at the head of the first metacarpal. Advanced arthritis at the [facility] joints. Probable erosive changes at the base of the fourth metacarpal. Suspected erosion base of first proximal phalanx. IMPRESSION: Diffuse osteopenia with erosive changes suspected at the base of fourth metacarpal and head of first metacarpal and base of first proximal phalanx, consistent with erosive arthropathy. Marked arthritis at the [facility] joints. Electronically Signed   By: Donavan Foil [name]   On: 11/26/2017 19:01    Microbiology: Recent Results (from the past 240 hour(s))  Blood Culture (routine x 2)     Status: None (Preliminary result)   Collection Time: 11/26/17  2:47 PM  Result Value Ref Range Status   Specimen Description BLOOD RIGHT ANTECUBITAL  Final   Special Requests   Final    BOTTLES DRAWN AEROBIC AND ANAEROBIC Blood Culture adequate volume   Culture   Final    NO GROWTH 3 DAYS Performed at [facility], [address] [place], [place] [address]    Report Status PENDING  Incomplete  Blood Culture (routine x 2)     Status: None (Preliminary result)   Collection Time: 11/26/17  2:51 PM  Result Value Ref Range Status   Specimen Description BLOOD RIGHT WRIST  Final   Special Requests   Final    BOTTLES DRAWN AEROBIC AND ANAEROBIC Blood Culture adequate volume   Culture   Final    NO GROWTH 3 DAYS Performed at [facility], [address] [place], [place] [address]    Report Status PENDING  Incomplete  Urine culture     Status: Abnormal   Collection Time: 11/26/17  2:53 PM  Result Value Ref Range Status   Specimen Description URINE, CLEAN CATCH  Final   Special Requests NONE  Final   Culture MULTIPLE SPECIES PRESENT, SUGGEST RECOLLECTION (Serrena Linderman)  Final   Report Status 11/28/2017  FINAL  Final  Body fluid culture     Status: None (Preliminary result)   Collection Time: 11/26/17  7:56 PM  Result Value Ref Range Status   Specimen Description SYNOVIAL LEFT ELBOW  Final   Special Requests NONE  Final   Gram Stain   Final    ABUNDANT WBC PRESENT, PREDOMINANTLY PMN NO ORGANISMS SEEN    Culture   Final    NO GROWTH 2 DAYS Performed at [facility], [place] [address] [place],  [address]    Report Status PENDING  Incomplete     Labs: Basic Metabolic Panel: Recent Labs  Lab 11/26/17 1444 11/27/17 0522 11/27/17 1416 11/28/17 0809 11/29/17 0449  NA 135 133*  --  134* 136  K 2.9* 4.5  --  4.5 4.7  CL 102 103  --  107 107  CO2 24 24  --  21* 22  GLUCOSE 105* 224*  --  132* 136*  BUN 21* 19  --  33* 40*  CREATININE 0.76 0.93  --  1.05* 1.14*  CALCIUM 9.2 8.9  --  8.7* 9.0  MG 1.2* 3.3* 2.8* 2.7*  --    Liver Function Tests: Recent Labs  Lab 11/26/17 1444 11/27/17 0522 11/28/17 0809  AST 21 20 25   ALT 13* 13* 14  ALKPHOS 54 51 50  BILITOT 0.9 1.0 0.2*  PROT 6.8 6.7 6.4*  ALBUMIN 3.4* 3.1* 3.0*   No results for input(s): LIPASE, AMYLASE in the last 168 hours. No results for input(s): AMMONIA in the last 168 hours. CBC: Recent Labs  Lab 11/26/17 1444 11/27/17 0522 11/28/17 0809 11/29/17 0449  WBC 13.5* 19.0* 22.6* 20.3*  NEUTROABS 10.5*  --   --   --   HGB 10.5* 10.6* 9.6* 9.6*  HCT 30.9* 31.6* 28.5* 28.5*  MCV 97.5 97.5 96.9 97.9  PLT 319 328 278 306   Cardiac Enzymes: No results for input(s): CKTOTAL, CKMB, CKMBINDEX, TROPONINI in the last 168 hours. BNP: BNP (last 3 results) No results for input(s): BNP in the last 8760 hours.  ProBNP (last 3 results) No results for input(s): PROBNP in the last 8760 hours.  CBG: Recent Labs  Lab 11/28/17 1207 11/28/17 1638 11/28/17 2129 11/29/17 0729 11/29/17 1207  GLUCAP 190* 104* 158* 116* 123*       Signed:  Fayrene Helper MD.  [facility] 11/29/2017, 6:36  PM

## 2017-11-29 NOTE — Progress Notes (Signed)
CSW consulted for "unsure of patient's disposition.Please follow for discharge needs.". Per chart review, PT recommended home health PT. RNCM following for home health service needs. CSW informed by patient's attending MD that patient's family requesting information about placement options.   CSW/RNCM spoke with patient/patient's daughters at bedside regarding discharge planning. CSW explained ALF/SNF placement options and payor sources. [name] reported that patient is not interested in using/losing her check to pay for placement. [name] verbalized plan for patient to dc home with caregivers. CSW provided patient's daughter with ALF/placement resources for future reference if needed. CSW signing off, no other needs identified at this time.   Celso Sickle, [place] Clinical Social Worker [facility] Cell#: [phone]

## 2017-11-29 NOTE — Care Management Note (Signed)
Case Management Note  Patient Details  Name: [name] [name] MRN: [id number] Date of Birth: 01-09-1930  Subjective/Objective:                    Action/Plan:   Expected Discharge Date:  (unknown)               Expected Discharge Plan:  Home w Home Health Services  In-House Referral:     Discharge planning Services  CM Consult  Post Acute Care Choice:    Choice offered to:  Adult Children, Patient  DME Arranged:    DME Agency:     HH Arranged:  RN, PT HH Agency:  [facility]  Status of Service:  Completed, signed off  If discussed at Long Length of Stay Meetings, dates discussed:    Additional CommentsGeni Bers, RN 11/29/2017, 10:40 AM

## 2017-11-29 NOTE — Consult Note (Signed)
WOC Nurse wound consult note Reason for Consult:Two full thickness wounds on right LE, chronic, non-healing. Other LE ulcerations with evidence of previous wound healing.  Is established with the outpatient [facility] here in [place] with appointment scheduled for tomorrow; they plan to cancel and reschedule due to recent hospitalization. Two daughters are present for my assessment today and are in agreement with POC. Wound type: Mixed etiology, venous insufficiency, likely some PAD (noncompressable veins) Pressure Injury POA: NA Measurement: anterior:  2.2cm x 2.5cm with depth obscured by the presence of slough. Medial:  2.5cm x 2.6cm with depth obscured by the presence of adherent slough. Wound bed:As described above Drainage (amount, consistency, odor) small amt of light yellow exudate consistent with debriding slough Periwound:intact with evidence of hyperpigmentation. Dressing procedure/placement/frequency: Will continue with once daily collagenase (Santyl ointment) for enzymatic debridement of the yellow adherent slough and top with a dry boot for mild compression. WOC nursing team will not follow, but will remain available to this patient, the nursing and medical teams.  Please re-consult if needed. Thanks, Ladona Mow, MSN, RN, GNP, [place]  Pager# [phone]

## 2017-11-29 NOTE — Evaluation (Signed)
Occupational Therapy Evaluation Patient Details Name: [name] [name] MRN: [id number] DOB: [birth year] Today's Date: 11/29/2017    History of Present Illness [name] [name] is a 84 y.o. female with medical history significant of RA, OA, B TKA, HTN, seizures, MI, gout, CAD, CVA, T2DM presenting with joint pain.   Clinical Impression   Pt was admitted for the above.  She reports that LUE is much less painful, but she has limited ROM in bil UEs which impairs ADLs.  Pt has an aide daily to assist with bathing, dressing and breakfast preparation. Will focus on toileting and grooming in acute setting. Recommend HHOT for meals and light IADLs at home    Follow Up Recommendations  Home health OT    Equipment Recommendations  None recommended by OT    Recommendations for Other Services       Precautions / Restrictions Precautions Precautions: Fall Restrictions Weight Bearing Restrictions: No      Mobility Bed Mobility               General bed mobility comments: oob  Transfers   Equipment used: Rolling walker (2 wheeled)   Sit to Stand: Min guard;Min assist         General transfer comment: min guard from recliner and min A from small chair with arms    Balance                                           ADL either performed or assessed with clinical judgement   ADL Overall ADL's : Needs assistance/impaired Eating/Feeding: Set up;Sitting   Grooming: Minimal assistance;Standing Grooming Details (indicate cue type and reason): to open containers Upper Body Bathing: Minimal assistance;Sitting   Lower Body Bathing: Moderate assistance;Sit to/from stand   Upper Body Dressing : Moderate assistance;Sitting   Lower Body Dressing: Maximal assistance;Sit to/from stand   Toilet Transfer: Minimal assistance;RW;Ambulation(chair) Statistician Details (indicate cue type and reason): light assistance to rise Toileting- Clothing Manipulation and  Hygiene: Minimal assistance;Sit to/from stand         General ADL Comments: ambulated to bathroom and sat on chair to complete bathing at sink.  Put her clothes on after getting back to recliner.  Pt has assistance for adls at home but is mod I with toileting.       Vision         Perception     Praxis      Pertinent Vitals/Pain Pain Assessment: Faces Faces Pain Scale: Hurts a little bit Pain Location: L arm Pain Descriptors / Indicators: Sore Pain Intervention(s): Limited activity within patient's tolerance;Monitored during session;Premedicated before session;Repositioned     Hand Dominance Right   Extremity/Trunk Assessment Upper Extremity Assessment Upper Extremity Assessment: Generalized weakness(Bil shoulders limited, R to 90; L to 70)           Communication Communication Communication: No difficulties   Cognition Arousal/Alertness: Awake/alert Behavior During Therapy: WFL for tasks assessed/performed Overall Cognitive Status: Within Functional Limits for tasks assessed                                     General Comments       Exercises     Shoulder Instructions      Home Living Family/patient expects to be discharged to:: Private residence  Living Arrangements: Alone Available Help at Discharge: Personal care attendant;Family;Available PRN/intermittently               Bathroom Shower/Tub: Tub/shower unit;Curtain   Bathroom Toilet: Handicapped height     Home Equipment: Stage manager - 4 wheels;Walker - 2 wheels;Cane - single point;Tub bench;Wheelchair - manual;Grab bars - toilet   Additional Comments: Aide 7 days/week in the AM      Prior Functioning/Environment Level of Independence: Independent with assistive device(s)        Comments: Amb with rollator, Aide assists with bath, getting dressed, and laundry        OT Problem List: Decreased strength;Decreased range of motion;Decreased activity  tolerance;Impaired balance (sitting and/or standing);Decreased knowledge of use of DME or AE;Pain      OT Treatment/Interventions: Self-care/ADL training;DME and/or AE instruction;Balance training;Patient/family education;Therapeutic activities    OT Goals(Current goals can be found in the care plan section) Acute Rehab OT Goals Patient Stated Goal: get back to normal OT Goal Formulation: With patient Time For Goal Achievement: 12/13/17 Potential to Achieve Goals: Good ADL Goals Pt Will Perform Grooming: with supervision;standing Pt Will Perform Toileting - Clothing Manipulation and hygiene: with supervision;sit to/from stand  OT Frequency: Min 2X/week   Barriers to D/C:            Co-evaluation              AM-PAC PT "6 Clicks" Daily Activity     Outcome Measure Help from another person eating meals?: A Little Help from another person taking care of personal grooming?: A Little Help from another person toileting, which includes using toliet, bedpan, or urinal?: A Little Help from another person bathing (including washing, rinsing, drying)?: A Lot Help from another person to put on and taking off regular upper body clothing?: A Lot Help from another person to put on and taking off regular lower body clothing?: A Lot 6 Click Score: 15   End of Session    Activity Tolerance: Patient tolerated treatment well Patient left: in chair;with call bell/phone within reach;with family/visitor present  OT Visit Diagnosis: Muscle weakness (generalized) (M62.81)                Time: 5188-4166 OT Time Calculation (min): 35 min Charges:  OT General Charges $OT Visit: 1 Visit OT Evaluation $OT Eval Moderate Complexity: 1 Mod OT Treatments $Self Care/Home Management : 8-22 mins G-Codes:     [place], OTR/L [phone] 11/29/2017  [name] [name] 11/29/2017, 12:30 PM

## 2017-11-29 NOTE — Progress Notes (Signed)
Physical Therapy Treatment Patient Details Name: [name] [name] MRN: [id number] DOB: August 05, 1930 Today's Date: 11/29/2017    History of Present Illness [name] [name] is a 84 y.o. female with medical history significant of RA, OA, B TKA, HTN, seizures, MI, gout, CAD, CVA, T2DM presenting with joint pain.    PT Comments    Pt had been seen by wound care RN a few minutes prior, PT redressed the wound with RN in order to get pt up and moving. Pt was able to ambulate to bathroom with RW, no assistance needed except with pulling mesh briefs up for her to reach them. Pt had a steady gait and was talkative while ambulating. Pt will d/c home with possible increased home care today. CM and CSW discussing discharge plans with daughters during session.    Follow Up Recommendations  Home health PT     Equipment Recommendations  None recommended by PT    Recommendations for Other Services       Precautions / Restrictions Precautions Precautions: Fall Precaution Comments: lower RLE wounds  Restrictions Weight Bearing Restrictions: No    Mobility  Bed Mobility Overal bed mobility: Needs Assistance Bed Mobility: Supine to Sit     Supine to sit: Min assist;HOB elevated     General bed mobility comments: assist for initial lift off of trunk  Transfers Overall transfer level: Needs assistance Equipment used: Rolling walker (2 wheeled) Transfers: Sit to/from Stand Sit to Stand: Min guard         General transfer comment: min guard from recliner and min A from small chair with arms  Ambulation/Gait Ambulation/Gait assistance: Min guard Ambulation Distance (Feet): 120 Feet Assistive device: Rolling walker (2 wheeled) Gait Pattern/deviations: Step-through pattern;Decreased step length - right;Decreased step length - left     General Gait Details: verbal cues for RW positioning, posture; used RW to go to bathroom prior to walking in Teacher, music Rankin (Stroke Patients Only)       Balance                                            Cognition Arousal/Alertness: Awake/alert Behavior During Therapy: WFL for tasks assessed/performed Overall Cognitive Status: Within Functional Limits for tasks assessed                                        Exercises      General Comments        Pertinent Vitals/Pain Pain Assessment: No/denies pain Faces Pain Scale: Hurts a little bit Pain Location: L arm Pain Descriptors / Indicators: Sore Pain Intervention(s): Limited activity within patient's tolerance;Monitored during session;Premedicated before session;Repositioned    Home Living Family/patient expects to be discharged to:: Private residence Living Arrangements: Alone Available Help at Discharge: Personal care attendant;Family;Available PRN/intermittently         Home Equipment: Hand held shower head;Walker - 4 wheels;Walker - 2 wheels;Cane - single point;Tub bench;Wheelchair - manual;Grab bars - toilet Additional Comments: Aide 7 days/week in the AM    Prior Function Level of Independence: Independent with assistive device(s)      Comments: Amb with rollator, Aide assists with bath, getting dressed, and laundry  PT Goals (current goals can now be found in the care plan section) Acute Rehab PT Goals Patient Stated Goal: get back to normal Progress towards PT goals: Progressing toward goals    Frequency    Min 3X/week      PT Plan Current plan remains appropriate    Co-evaluation              AM-PAC PT "6 Clicks" Daily Activity  Outcome Measure  Difficulty turning over in bed (including adjusting bedclothes, sheets and blankets)?: A Little Difficulty moving from lying on back to sitting on the side of the bed? : Unable Difficulty sitting down on and standing up from a chair with arms (e.g., wheelchair, bedside commode, etc,.)?: A  Little Help needed moving to and from a bed to chair (including a wheelchair)?: A Little Help needed walking in hospital room?: A Little Help needed climbing 3-5 steps with a railing? : A Lot 6 Click Score: 15    End of Session   Activity Tolerance: Patient tolerated treatment well Patient left: in chair;with call bell/phone within reach;with family/visitor present Nurse Communication: Mobility status PT Visit Diagnosis: Difficulty in walking, not elsewhere classified (R26.2)     Time: 1017-1050 PT Time Calculation (min) (ACUTE ONLY): 33 min  Charges:  $Gait Training: 8-22 mins                    G Codes:       [place] Payson Crumby, SPT  [place] Anitta Tenny 11/29/2017, 1:18 PM

## 2017-11-30 ENCOUNTER — Telehealth: Payer: Self-pay | Admitting: Vascular Surgery

## 2017-11-30 ENCOUNTER — Telehealth: Payer: Self-pay | Admitting: *Deleted

## 2017-11-30 LAB — BODY FLUID CULTURE: Culture: NO GROWTH

## 2017-11-30 NOTE — Telephone Encounter (Signed)
-----   Message from Sharee Pimple, RN sent at 11/29/2017  4:27 PM EST ----- Regarding: make her a new pt appt with Dr. Randie Heinz   ----- Message ----- From: Maeola Harman, MD Sent: 11/29/2017   4:11 PM To: Sharee Pimple, RN Subject: RE: Consult?                                   Well she was leaving from  so they just asked if I could see her as an outpatient. Its ok if she sees me.   Apolinar Junes  ----- Message ----- From: Sharee Pimple, RN Sent: 11/29/2017   2:58 PM To: Maeola Harman, MD Subject: Consult?                                       Did you do a consult? Otherwise, we will have to make a New Pt appt with MD instead of NP / PA,   ----- Message ----- From: Maeola Harman, MD Sent: 11/29/2017  12:34 PM To: [address]  DIOR DOMINIK [phone] 1930/03/27  F/u with np/pa in 3-4 weeks

## 2017-11-30 NOTE — Telephone Encounter (Signed)
Sched appt 12/24/17 at 9:45. Lm on hm# to inform pt of appt.

## 2017-11-30 NOTE — Telephone Encounter (Signed)
Per chart Review: Admit date: 11/26/2017 Discharge date: 11/29/2017  Time spent: over 30 minutes  Recommendations for Outpatient Follow-up:  1. Follow up outpatient CBC/CMP 2. Follow up symptoms on steroid taper 3. Follow up final joint fluid culture and blood cultures 4. Ensure follow up with rheumatology 5. Patient will need to follow up with vascular for abnormal ABI's 6. Follow up RLE wounds, discharged with 3 days abx, reevaluate to consider whether patient needs longer course, but suspect her presentation and symptoms were related to RA flare as opposed to sepsis due to wound infection 7. Follow up blood sugars on steroid taper 8. Follow up imaging below 9. AntiCCP pending at d/c  Discharge Diagnoses:  Principal Problem:   SIRS (systemic inflammatory response syndrome) (HCC) Active Problems:   Hyperlipidemia   Reactive airway disease   Gout   Fibromyalgia   HTN (hypertension)   CAD (coronary artery disease)   Rheumatoid arthritis (HCC)   Stroke (HCC)   CKD (chronic kidney disease) stage 3, GFR 30-59 ml/min (HCC)   DM (diabetes mellitus), type 2 with renal complications (HCC)   Acute on chronic diastolic heart failure (HCC)   Discharge Condition: stable  Diet recommendation: heart healthy  ___________________________________________________________________________ Per phone Call: Transition Care Management Follow-up Telephone Call   Date discharged? 11/29/17   How have you been since you were released from the hospital? "fine"   Do you understand why you were in the hospital? yes   Do you understand the discharge instructions? yes   Where were you discharged to? Home   Items Reviewed:  Medications reviewed: yes  Allergies reviewed: yes  Dietary changes reviewed: yes  Referrals reviewed: yes   Functional Questionnaire:   Activities of Daily Living (ADLs):   She states they are independent in the following: ambulation, bathing and hygiene,  feeding, continence, grooming, toileting and dressing States they require assistance with the following: Home health has been consulted   Any transportation issues/concerns?: no   Any patient concerns? no   Confirmed importance and date/time of follow-up visits scheduled yes  Provider Appointment booked with Dr Earlene Plater 12/03/17 11:00  Confirmed with patient if condition begins to worsen call PCP or go to the ER.  Patient was given the office number and encouraged to call back with question or concerns.  : yes

## 2017-12-01 LAB — CULTURE, BLOOD (ROUTINE X 2)
Culture: NO GROWTH
Culture: NO GROWTH
Special Requests: ADEQUATE
Special Requests: ADEQUATE

## 2017-12-03 ENCOUNTER — Encounter: Payer: Self-pay | Admitting: Family Medicine

## 2017-12-03 ENCOUNTER — Ambulatory Visit (INDEPENDENT_AMBULATORY_CARE_PROVIDER_SITE_OTHER): Payer: Medicare Other | Admitting: Family Medicine

## 2017-12-03 ENCOUNTER — Telehealth: Payer: Self-pay | Admitting: Family Medicine

## 2017-12-03 VITALS — BP 164/78 | HR 94 | Temp 97.7°F | Ht 64.0 in | Wt 189.6 lb

## 2017-12-03 DIAGNOSIS — M1990 Unspecified osteoarthritis, unspecified site: Secondary | ICD-10-CM | POA: Diagnosis not present

## 2017-12-03 DIAGNOSIS — M7501 Adhesive capsulitis of right shoulder: Secondary | ICD-10-CM | POA: Diagnosis not present

## 2017-12-03 DIAGNOSIS — L97319 Non-pressure chronic ulcer of right ankle with unspecified severity: Secondary | ICD-10-CM | POA: Diagnosis not present

## 2017-12-03 DIAGNOSIS — M109 Gout, unspecified: Secondary | ICD-10-CM | POA: Diagnosis not present

## 2017-12-03 DIAGNOSIS — L97819 Non-pressure chronic ulcer of other part of right lower leg with unspecified severity: Secondary | ICD-10-CM | POA: Diagnosis not present

## 2017-12-03 DIAGNOSIS — M0609 Rheumatoid arthritis without rheumatoid factor, multiple sites: Secondary | ICD-10-CM | POA: Diagnosis not present

## 2017-12-03 DIAGNOSIS — N183 Chronic kidney disease, stage 3 unspecified: Secondary | ICD-10-CM

## 2017-12-03 DIAGNOSIS — T148XXD Other injury of unspecified body region, subsequent encounter: Secondary | ICD-10-CM

## 2017-12-03 DIAGNOSIS — Z7984 Long term (current) use of oral hypoglycemic drugs: Secondary | ICD-10-CM | POA: Diagnosis not present

## 2017-12-03 DIAGNOSIS — Z7952 Long term (current) use of systemic steroids: Secondary | ICD-10-CM | POA: Diagnosis not present

## 2017-12-03 DIAGNOSIS — I130 Hypertensive heart and chronic kidney disease with heart failure and stage 1 through stage 4 chronic kidney disease, or unspecified chronic kidney disease: Secondary | ICD-10-CM | POA: Diagnosis not present

## 2017-12-03 DIAGNOSIS — Z7982 Long term (current) use of aspirin: Secondary | ICD-10-CM | POA: Diagnosis not present

## 2017-12-03 DIAGNOSIS — I129 Hypertensive chronic kidney disease with stage 1 through stage 4 chronic kidney disease, or unspecified chronic kidney disease: Secondary | ICD-10-CM | POA: Diagnosis not present

## 2017-12-03 DIAGNOSIS — I252 Old myocardial infarction: Secondary | ICD-10-CM | POA: Diagnosis not present

## 2017-12-03 DIAGNOSIS — I5023 Acute on chronic systolic (congestive) heart failure: Secondary | ICD-10-CM | POA: Diagnosis not present

## 2017-12-03 DIAGNOSIS — L97219 Non-pressure chronic ulcer of right calf with unspecified severity: Secondary | ICD-10-CM | POA: Diagnosis not present

## 2017-12-03 DIAGNOSIS — R569 Unspecified convulsions: Secondary | ICD-10-CM | POA: Diagnosis not present

## 2017-12-03 DIAGNOSIS — R6510 Systemic inflammatory response syndrome (SIRS) of non-infectious origin without acute organ dysfunction: Secondary | ICD-10-CM | POA: Diagnosis not present

## 2017-12-03 DIAGNOSIS — Z09 Encounter for follow-up examination after completed treatment for conditions other than malignant neoplasm: Secondary | ICD-10-CM

## 2017-12-03 DIAGNOSIS — E1122 Type 2 diabetes mellitus with diabetic chronic kidney disease: Secondary | ICD-10-CM

## 2017-12-03 DIAGNOSIS — M0689 Other specified rheumatoid arthritis, multiple sites: Secondary | ICD-10-CM | POA: Diagnosis not present

## 2017-12-03 DIAGNOSIS — E11622 Type 2 diabetes mellitus with other skin ulcer: Secondary | ICD-10-CM | POA: Diagnosis not present

## 2017-12-03 DIAGNOSIS — I2510 Atherosclerotic heart disease of native coronary artery without angina pectoris: Secondary | ICD-10-CM | POA: Diagnosis not present

## 2017-12-03 DIAGNOSIS — Z8673 Personal history of transient ischemic attack (TIA), and cerebral infarction without residual deficits: Secondary | ICD-10-CM | POA: Diagnosis not present

## 2017-12-03 DIAGNOSIS — Z96653 Presence of artificial knee joint, bilateral: Secondary | ICD-10-CM | POA: Diagnosis not present

## 2017-12-03 LAB — CBC WITH DIFFERENTIAL/PLATELET
Basophils Absolute: 0 10*3/uL (ref 0.0–0.1)
Basophils Relative: 0.3 % (ref 0.0–3.0)
Eosinophils Absolute: 0.1 10*3/uL (ref 0.0–0.7)
Eosinophils Relative: 0.5 % (ref 0.0–5.0)
HCT: 31.6 % — ABNORMAL LOW (ref 36.0–46.0)
Hemoglobin: 10.5 g/dL — ABNORMAL LOW (ref 12.0–15.0)
Lymphocytes Relative: 13.6 % (ref 12.0–46.0)
Lymphs Abs: 1.9 10*3/uL (ref 0.7–4.0)
MCHC: 33.3 g/dL (ref 30.0–36.0)
MCV: 100.9 fl — ABNORMAL HIGH (ref 78.0–100.0)
Monocytes Absolute: 0.7 10*3/uL (ref 0.1–1.0)
Monocytes Relative: 4.7 % (ref 3.0–12.0)
Neutro Abs: 11.5 10*3/uL — ABNORMAL HIGH (ref 1.4–7.7)
Neutrophils Relative %: 80.9 % — ABNORMAL HIGH (ref 43.0–77.0)
Platelets: 376 10*3/uL (ref 150.0–400.0)
RBC: 3.13 Mil/uL — ABNORMAL LOW (ref 3.87–5.11)
RDW: 16 % — ABNORMAL HIGH (ref 11.5–15.5)
WBC: 14.2 10*3/uL — ABNORMAL HIGH (ref 4.0–10.5)

## 2017-12-03 LAB — COMPREHENSIVE METABOLIC PANEL
ALT: 14 U/L (ref 0–35)
AST: 16 U/L (ref 0–37)
Albumin: 3.6 g/dL (ref 3.5–5.2)
Alkaline Phosphatase: 49 U/L (ref 39–117)
BUN: 37 mg/dL — ABNORMAL HIGH (ref 6–23)
CO2: 22 mEq/L (ref 19–32)
Calcium: 9.4 mg/dL (ref 8.4–10.5)
Chloride: 103 mEq/L (ref 96–112)
Creatinine, Ser: 1.26 mg/dL — ABNORMAL HIGH (ref 0.40–1.20)
GFR: 51.82 mL/min — ABNORMAL LOW (ref >60.00)
Glucose, Bld: 112 mg/dL — ABNORMAL HIGH (ref 70–99)
Potassium: 4.4 mEq/L (ref 3.5–5.1)
Sodium: 134 mEq/L — ABNORMAL LOW (ref 135–145)
Total Bilirubin: 0.3 mg/dL (ref 0.2–1.2)
Total Protein: 6.8 g/dL (ref 6.0–8.3)

## 2017-12-03 MED ORDER — PREDNISONE 10 MG PO TABS: 10.0000 mg | ORAL_TABLET | Freq: Every day | ORAL | 0 refills | Status: DC

## 2017-12-03 MED ORDER — OXYCODONE HCL 5 MG PO CAPS: 5.0000 mg | ORAL_CAPSULE | ORAL | 0 refills | Status: DC | PRN

## 2017-12-03 MED ORDER — TRAMADOL HCL 50 MG PO TABS: ORAL_TABLET | ORAL | 5 refills | Status: DC

## 2017-12-03 NOTE — Progress Notes (Signed)
[name] [name] is a 84 y.o. female is here for follow up.  History of Present Illness:   Shaune Pascal CMA acting as scribe for Dr. Juleen China  HPI: See Assessment and Plan section for Problem Based Charting of issues discussed today.  Hospital follow up: Patient comes in today for hospital follow up. She was admitted for a RA flare up. The hospital worked her for multiple things. She is on a prednisone taper that is making her feel good right now. We will schedule a follow up with Rheumatology. Dr. Juleen China will keep patient on prednisone until she gets into Rheumatology. We will get follow up labs today.   Wound: Wound is wrapped today, but patient states that the wound is healing up nicely. We will send her to Dermatology and Vascular for this.   Cyst: Patient stated that the site is healed up. Dr. Paulla Fore drained at last visit.    Health Maintenance Due  Topic Date Due  . OPHTHALMOLOGY EXAM  09/12/2015  . URINE MICROALBUMIN  07/04/2016  . PNA vac Low Risk Adult (2 of 2 - PCV13) 09/11/2016   Depression screen PHQ 2/9 06/28/2017 03/29/2017 03/04/2016  Decreased Interest 0 0 0  Down, Depressed, Hopeless 0 0 0  PHQ - 2 Score 0 0 0  Some recent data might be hidden   PMHx, SurgHx, SocialHx, FamHx, Medications, and Allergies were reviewed in the Visit Navigator and updated as appropriate.   Patient Active Problem List   Diagnosis Date Noted  . SIRS (systemic inflammatory response syndrome) ([place]) 11/26/2017  . Acute pain of right shoulder 10/12/2017  . Adhesive capsulitis of right shoulder 10/12/2017  . Rheumatic nodule 06/28/2017  . History of CHF (congestive heart failure) 02/18/2017  . History of diabetes mellitus 02/18/2017  . History of total knee arthroplasty, bilateral 02/18/2017  . History of rotator cuff surgery 02/18/2017  . Obesity (BMI 30.0-34.9) 12/08/2016  . High risk medications (not anticoagulants) long-term use 09/21/2016  . Hammer toe 10/31/2015  . Acute on  chronic diastolic heart failure ([place]) 09/12/2015  . Onychomycosis 04/24/2015  . Midline low back pain without sciatica 06/27/2014  . Bilateral edema of lower extremity 06/05/2014  . CKD (chronic kidney disease) stage 3, GFR 30-59 ml/min (HCC) 06/05/2014  . DM (diabetes mellitus), type 2 with renal complications ([place]) 90/30/0923    Class: Chronic  . Anemia of chronic disease 05/01/2014  . Fluctuating blood pressure 02/06/2014  . Stroke ([place]) 01/17/2014  . Rheumatoid arthritis ([place]) 12/19/2013    Class: Chronic  . CAD (coronary artery disease) 10/18/2013  . HTN (hypertension) 08/08/2013    Class: Chronic  . Fibromyalgia 05/10/2013  . Reactive airway disease 04/19/2013  . Gout 04/19/2013  . Hyperlipidemia 04/27/2007  . Allergic rhinitis 04/27/2007  . Diverticulosis 04/27/2007   Social History   Tobacco Use  . Smoking status: Never Smoker  . Smokeless tobacco: Never Used  Substance Use Topics  . Alcohol use: No    Alcohol/week: 0.0 oz  . Drug use: No   Current Medications and Allergies:   Current Outpatient Medications:  .  albuterol (PROVENTIL HFA;VENTOLIN HFA) 108 (90 Base) MCG/ACT inhaler, Inhale 1 puff into the lungs every 6 (six) hours as needed for wheezing or shortness of breath., Disp: , Rfl:  .  allopurinol (ZYLOPRIM) 300 MG tablet, Take 0.5 tablets (150 mg total) by mouth every evening., Disp: 15 tablet, Rfl: 0 .  aspirin EC 81 MG tablet, Take 81 mg by mouth daily., Disp: ,  Rfl:  .  blood glucose meter kit and supplies KIT, Dispense based on patient and insurance preference. Use up to four times daily as directed. (FOR ICD-9 250.00, 250.01)., Disp: 1 each, Rfl: 0 .  calcium-vitamin D (OSCAL WITH D) 500-200 MG-UNIT per tablet, Take 1 tablet by mouth daily. , Disp: , Rfl:  .  CHLOROPHYLL PO, Take 5 drops by mouth See admin instructions. MIX INTO 6 OUNCES OF JUICE AND DRINK ONCE A DAY, Disp: , Rfl:  .  ferrous sulfate 325 (65 FE) MG tablet, Take 325 mg by mouth daily. ,  Disp: , Rfl:  .  Fluticasone-Salmeterol (ADVAIR DISKUS) 250-50 MCG/DOSE AEPB, Inhale 1 puff into the lungs 2 (two) times daily., Disp: , Rfl:  .  folic acid (FOLVITE) 1 MG tablet, Take 1 tablet (1 mg total) by mouth daily., Disp: 90 tablet, Rfl: 4 .  furosemide (LASIX) 20 MG tablet, Take 1 tablet (20 mg total) by mouth every other day., Disp: 30 tablet, Rfl: 5 .  gabapentin (NEURONTIN) 100 MG capsule, Take 1 capsule (100 mg total) at bedtime by mouth., Disp: 30 capsule, Rfl: 3 .  hydrALAZINE (APRESOLINE) 100 MG tablet, take 1 tablet by mouth three times a day, Disp: 90 tablet, Rfl: 1 .  hydroxypropyl methylcellulose / hypromellose (ISOPTO TEARS / GONIOVISC) 2.5 % ophthalmic solution, Place 1 drop into both eyes 4 (four) times daily as needed for dry eyes., Disp: 15 mL, Rfl: 2 .  ketoconazole (NIZORAL) 2 % cream, Apply 1 application topically 2 (two) times daily., Disp: 30 g, Rfl: 0 .  labetalol (NORMODYNE) 100 MG tablet, take 2 tablets by mouth twice a day, Disp: 120 tablet, Rfl: 1 .  metFORMIN (GLUCOPHAGE) 500 MG tablet, take 1 tablet by mouth once daily for diabetes (Patient taking differently: Take 500 mg by mouth daily with breakfast. ), Disp: 90 tablet, Rfl: 1 .  methotrexate (RHEUMATREX) 2.5 MG tablet, Take 10 mg by mouth once a week. , Disp: , Rfl:  .  Multiple Vitamins-Minerals (ALIVE ONCE DAILY WOMENS PO), Take 15 mLs by mouth every other day. , Disp: , Rfl:  .  oxycodone (OXY-IR) 5 MG capsule, Take 5 mg by mouth every 4 (four) hours as needed for pain., Disp: , Rfl:  .  predniSONE (DELTASONE) 20 MG tablet, Take 2 tablets (40 mg total) by mouth daily for 3 days, THEN 1.5 tablets (30 mg total) daily for 3 days, THEN 1 tablet (20 mg total) daily for 3 days, THEN 0.5 tablets (10 mg total) daily for 3 days., Disp: 15 tablet, Rfl: 0 .  predniSONE (DELTASONE) 5 MG tablet, take 1 tablet by mouth once daily WITH BREAKFAST, Disp: 30 tablet, Rfl: 0 .  SANTYL ointment, Apply 1 application topically  daily. AS DIRECTED, Disp: , Rfl: 0 .  traMADol (ULTRAM) 50 MG tablet, Take 1-2 tablets every 6 hours as needed for pain, Disp: 240 tablet, Rfl: 5 .  triamcinolone ointment (KENALOG) 0.5 %, Apply 1 application 2 (two) times daily topically., Disp: 15 g, Rfl: 3 .  vitamin C (ASCORBIC ACID) 500 MG tablet, Take 500 mg by mouth every other day. , Disp: , Rfl:   Allergies  Allergen Reactions  . Clonidine Derivatives Swelling    [name]  . Fish Allergy Diarrhea, Swelling and Other (See Comments)    Turns skin "black," but can tolerate white fish Salmon- Diarrhea  . Shellfish Allergy Hives  . Doxycycline Rash  .  Indomethacin     Reaction not recalled by the patient  . Lyrica [Pregabalin]     Hallucinations   . Methyldopa     Aldomet (for hypertension): Reaction not recalled by the patient  . Orange Fruit [Citrus] Other (See Comments)    Indigestion/heartburn  . Cetirizine Hcl Itching and Rash  . Codeine Itching  . Levaquin [Levofloxacin In D5w] Rash  . Tomato Rash   Review of Systems   Pertinent items are noted in the HPI. Otherwise, ROS is negative.  Vitals:   Vitals:   12/03/17 1100  BP: (!) 164/78  Pulse: 94  Temp: 97.7 F (36.5 C)  TempSrc: Oral  SpO2: 96%  Weight: 189 lb 9.6 oz (86 kg)  Height: 5' 4"  (1.626 m)     Body mass index is 32.54 kg/m.  Physical Exam:   Physical Exam  Constitutional: She appears well-nourished.  HENT:  Head: Normocephalic and atraumatic.  Eyes: EOM are normal. Pupils are equal, round, and reactive to light.  Neck: Normal range of motion. Neck supple.  Cardiovascular: Normal rate, regular rhythm, normal heart sounds and intact distal pulses.  Pulmonary/Chest: Effort normal.  Abdominal: Soft.  Skin: Skin is warm.  Psychiatric: She has a normal mood and affect. Her behavior is normal.  Nursing note and vitals reviewed.   Results for orders placed or performed  during the hospital encounter of 11/26/17  Blood Culture (routine x 2)  Result Value Ref Range   Specimen Description BLOOD RIGHT ANTECUBITAL    Special Requests      BOTTLES DRAWN AEROBIC AND ANAEROBIC Blood Culture adequate volume   Culture      NO GROWTH 5 DAYS Performed at [facility], [place] [address] [place], [place] [address]    Report Status 12/01/2017 FINAL   Blood Culture (routine x 2)  Result Value Ref Range   Specimen Description BLOOD RIGHT WRIST    Special Requests      BOTTLES DRAWN AEROBIC AND ANAEROBIC Blood Culture adequate volume   Culture      NO GROWTH 5 DAYS Performed at [facility], [place] [address] [place], [place] [address]    Report Status 12/01/2017 FINAL   Urine culture  Result Value Ref Range   Specimen Description URINE, CLEAN CATCH    Special Requests NONE    Culture MULTIPLE SPECIES PRESENT, SUGGEST RECOLLECTION (A)    Report Status 11/28/2017 FINAL   Body fluid culture  Result Value Ref Range   Specimen Description SYNOVIAL LEFT ELBOW    Special Requests NONE    Gram Stain      ABUNDANT WBC PRESENT, PREDOMINANTLY PMN NO ORGANISMS SEEN    Culture      NO GROWTH 3 DAYS Performed at [facility], [place] [address] [place], [place] [address]    Report Status 11/30/2017 FINAL   Comprehensive metabolic panel  Result Value Ref Range   Sodium 135 135 - 145 mmol/L   Potassium 2.9 (L) 3.5 - 5.1 mmol/L   Chloride 102 101 - 111 mmol/L   CO2 24 22 - 32 mmol/L   Glucose, Bld 105 (H) 65 - 99 mg/dL   BUN 21 (H) 6 - 20 mg/dL   Creatinine, Ser 0.76 0.44 - 1.00 mg/dL   Calcium 9.2 8.9 - 10.3 mg/dL   Total Protein 6.8 6.5 - 8.1 g/dL   Albumin 3.4 (L) 3.5 - 5.0 g/dL   AST 21 15 - 41 U/L   ALT  13 (L) 14 - 54 U/L   Alkaline Phosphatase 54 38 - 126 U/L   Total Bilirubin 0.9 0.3 - 1.2 mg/dL   GFR calc non Af Amer >60 >60 mL/min   GFR calc Af Amer >60 >60 mL/min   Anion gap 9 5 - 15  CBC WITH DIFFERENTIAL  Result Value Ref Range    WBC 13.5 (H) 4.0 - 10.5 K/uL   RBC 3.17 (L) 3.87 - 5.11 MIL/uL   Hemoglobin 10.5 (L) 12.0 - 15.0 g/dL   HCT 30.9 (L) 36.0 - 46.0 %   MCV 97.5 78.0 - 100.0 fL   MCH 33.1 26.0 - 34.0 pg   MCHC 34.0 30.0 - 36.0 g/dL   RDW 15.7 (H) 11.5 - 15.5 %   Platelets 319 150 - 400 K/uL   Neutrophils Relative % 77 %   Neutro Abs 10.5 (H) 1.7 - 7.7 K/uL   Lymphocytes Relative 14 %   Lymphs Abs 1.8 0.7 - 4.0 K/uL   Monocytes Relative 8 %   Monocytes Absolute 1.1 (H) 0.1 - 1.0 K/uL   Eosinophils Relative 1 %   Eosinophils Absolute 0.1 0.0 - 0.7 K/uL   Basophils Relative 0 %   Basophils Absolute 0.0 0.0 - 0.1 K/uL  Urinalysis, Routine w reflex microscopic  Result Value Ref Range   Color, Urine YELLOW YELLOW   APPearance CLEAR CLEAR   Specific Gravity, Urine 1.010 1.005 - 1.030   pH 8.0 5.0 - 8.0   Glucose, UA NEGATIVE NEGATIVE mg/dL   Hgb urine dipstick NEGATIVE NEGATIVE   Bilirubin Urine NEGATIVE NEGATIVE   Ketones, ur NEGATIVE NEGATIVE mg/dL   Protein, ur NEGATIVE NEGATIVE mg/dL   Nitrite NEGATIVE NEGATIVE   Leukocytes, UA NEGATIVE NEGATIVE  Influenza panel by PCR (type A & B)  Result Value Ref Range   Influenza A By PCR NEGATIVE NEGATIVE   Influenza B By PCR NEGATIVE NEGATIVE  Magnesium  Result Value Ref Range   Magnesium 1.2 (L) 1.7 - 2.4 mg/dL  Sedimentation rate  Result Value Ref Range   Sed Rate 32 (H) 0 - 22 mm/hr  Hemoglobin A1c  Result Value Ref Range   Hgb A1c MFr Bld 5.4 4.8 - 5.6 %   Mean Plasma Glucose 108.28 mg/dL  HIV antibody  Result Value Ref Range   HIV Screen 4th Generation wRfx Non Reactive Non Reactive  Prealbumin  Result Value Ref Range   Prealbumin 20.5 18 - 38 mg/dL  ANA w/Reflex if Positive  Result Value Ref Range   Anit Nuclear Antibody(ANA) Negative Negative  Rheumatoid factor  Result Value Ref Range   Rhuematoid fact SerPl-aCnc 49.4 (H) 0.0 - 05.6 IU/mL  CYCLIC CITRUL PEPTIDE ANTIBODY, IGG/IGA  Result Value Ref Range   CCP Antibodies IgG/IgA >250  (H) 0 - 19 units  C-reactive protein  Result Value Ref Range   CRP 13.5 (H) <1.0 mg/dL  Synovial cell count + diff, w/ crystals  Result Value Ref Range   Color, Synovial RED (A) YELLOW   Appearance-Synovial TURBID (A) CLEAR   Crystals, Fluid NO CRYSTALS SEEN    WBC, Synovial UNABLE TO PERFORM COUNT DUE TO CLOT IN SPECIMEN 0 - 200 /cu mm   Neutrophil, Synovial 92 (H) 0 - 25 %   Lymphocytes-Synovial Fld 6 0 - 20 %   Monocyte-Macrophage-Synovial Fluid 2 (L) 50 - 90 %   Eosinophils-Synovial 0 0 - 1 %  Comprehensive metabolic panel  Result Value Ref Range   Sodium 133 (L)  135 - 145 mmol/L   Potassium 4.5 3.5 - 5.1 mmol/L   Chloride 103 101 - 111 mmol/L   CO2 24 22 - 32 mmol/L   Glucose, Bld 224 (H) 65 - 99 mg/dL   BUN 19 6 - 20 mg/dL   Creatinine, Ser 0.93 0.44 - 1.00 mg/dL   Calcium 8.9 8.9 - 10.3 mg/dL   Total Protein 6.7 6.5 - 8.1 g/dL   Albumin 3.1 (L) 3.5 - 5.0 g/dL   AST 20 15 - 41 U/L   ALT 13 (L) 14 - 54 U/L   Alkaline Phosphatase 51 38 - 126 U/L   Total Bilirubin 1.0 0.3 - 1.2 mg/dL   GFR calc non Af Amer 54 (L) >60 mL/min   GFR calc Af Amer >60 >60 mL/min   Anion gap 6 5 - 15  CBC  Result Value Ref Range   WBC 19.0 (H) 4.0 - 10.5 K/uL   RBC 3.24 (L) 3.87 - 5.11 MIL/uL   Hemoglobin 10.6 (L) 12.0 - 15.0 g/dL   HCT 31.6 (L) 36.0 - 46.0 %   MCV 97.5 78.0 - 100.0 fL   MCH 32.7 26.0 - 34.0 pg   MCHC 33.5 30.0 - 36.0 g/dL   RDW 15.9 (H) 11.5 - 15.5 %   Platelets 328 150 - 400 K/uL  Uric acid  Result Value Ref Range   Uric Acid, Serum 4.6 2.3 - 6.6 mg/dL  Glucose, capillary  Result Value Ref Range   Glucose-Capillary 166 (H) 65 - 99 mg/dL  Glucose, capillary  Result Value Ref Range   Glucose-Capillary 201 (H) 65 - 99 mg/dL  Blood gas, venous  Result Value Ref Range   pH, Ven 7.412 7.250 - 7.430   pCO2, Ven 38.7 (L) 44.0 - 60.0 mmHg   pO2, Ven 59.7 (H) 32.0 - 45.0 mmHg   Bicarbonate 24.2 20.0 - 28.0 mmol/L   Acid-Base Excess 0.2 0.0 - 2.0 mmol/L   O2 Saturation  89.7 %   Patient temperature 98.6    Collection site VEIN    Drawn by COLLECTED BY LABORATORY    Sample type VENOUS   Magnesium  Result Value Ref Range   Magnesium 3.3 (H) 1.7 - 2.4 mg/dL  Magnesium  Result Value Ref Range   Magnesium 2.8 (H) 1.7 - 2.4 mg/dL  Glucose, capillary  Result Value Ref Range   Glucose-Capillary 194 (H) 65 - 99 mg/dL  Glucose, capillary  Result Value Ref Range   Glucose-Capillary 173 (H) 65 - 99 mg/dL  CBC  Result Value Ref Range   WBC 22.6 (H) 4.0 - 10.5 K/uL   RBC 2.94 (L) 3.87 - 5.11 MIL/uL   Hemoglobin 9.6 (L) 12.0 - 15.0 g/dL   HCT 28.5 (L) 36.0 - 46.0 %   MCV 96.9 78.0 - 100.0 fL   MCH 32.7 26.0 - 34.0 pg   MCHC 33.7 30.0 - 36.0 g/dL   RDW 15.9 (H) 11.5 - 15.5 %   Platelets 278 150 - 400 K/uL  Comprehensive metabolic panel  Result Value Ref Range   Sodium 134 (L) 135 - 145 mmol/L   Potassium 4.5 3.5 - 5.1 mmol/L   Chloride 107 101 - 111 mmol/L   CO2 21 (L) 22 - 32 mmol/L   Glucose, Bld 132 (H) 65 - 99 mg/dL   BUN 33 (H) 6 - 20 mg/dL   Creatinine, Ser 1.05 (H) 0.44 - 1.00 mg/dL   Calcium 8.7 (L) 8.9 - 10.3 mg/dL  Total Protein 6.4 (L) 6.5 - 8.1 g/dL   Albumin 3.0 (L) 3.5 - 5.0 g/dL   AST 25 15 - 41 U/L   ALT 14 14 - 54 U/L   Alkaline Phosphatase 50 38 - 126 U/L   Total Bilirubin 0.2 (L) 0.3 - 1.2 mg/dL   GFR calc non Af Amer 47 (L) >60 mL/min   GFR calc Af Amer 55 (L) >60 mL/min   Anion gap 6 5 - 15  Magnesium  Result Value Ref Range   Magnesium 2.7 (H) 1.7 - 2.4 mg/dL  Glucose, capillary  Result Value Ref Range   Glucose-Capillary 250 (H) 65 - 99 mg/dL  Glucose, capillary  Result Value Ref Range   Glucose-Capillary 133 (H) 65 - 99 mg/dL  Glucose, capillary  Result Value Ref Range   Glucose-Capillary 190 (H) 65 - 99 mg/dL  Glucose, capillary  Result Value Ref Range   Glucose-Capillary 104 (H) 65 - 99 mg/dL  CBC  Result Value Ref Range   WBC 20.3 (H) 4.0 - 10.5 K/uL   RBC 2.91 (L) 3.87 - 5.11 MIL/uL   Hemoglobin 9.6 (L)  12.0 - 15.0 g/dL   HCT 28.5 (L) 36.0 - 46.0 %   MCV 97.9 78.0 - 100.0 fL   MCH 33.0 26.0 - 34.0 pg   MCHC 33.7 30.0 - 36.0 g/dL   RDW 16.0 (H) 11.5 - 15.5 %   Platelets 306 150 - 400 K/uL  Basic metabolic panel  Result Value Ref Range   Sodium 136 135 - 145 mmol/L   Potassium 4.7 3.5 - 5.1 mmol/L   Chloride 107 101 - 111 mmol/L   CO2 22 22 - 32 mmol/L   Glucose, Bld 136 (H) 65 - 99 mg/dL   BUN 40 (H) 6 - 20 mg/dL   Creatinine, Ser 1.14 (H) 0.44 - 1.00 mg/dL   Calcium 9.0 8.9 - 10.3 mg/dL   GFR calc non Af Amer 43 (L) >60 mL/min   GFR calc Af Amer 49 (L) >60 mL/min   Anion gap 7 5 - 15  Glucose, capillary  Result Value Ref Range   Glucose-Capillary 158 (H) 65 - 99 mg/dL  Glucose, capillary  Result Value Ref Range   Glucose-Capillary 116 (H) 65 - 99 mg/dL  Glucose, capillary  Result Value Ref Range   Glucose-Capillary 123 (H) 65 - 99 mg/dL  I-Stat CG4 Lactic Acid, ED  (not at  [facility])  Result Value Ref Range   Lactic Acid, Venous 1.50 0.5 - 1.9 mmol/L  I-Stat CG4 Lactic Acid, ED  (not at  [facility])  Result Value Ref Range   Lactic Acid, Venous 1.23 0.5 - 1.9 mmol/L   Assessment and Plan:   1. Rheumatoid arthritis of multiple sites with negative rheumatoid factor (HCC) - oxycodone (OXY-IR) 5 MG capsule; Take 1 capsule (5 mg total) by mouth every 4 (four) hours as needed for SEVERE pain.  Dispense: 30 capsule; Refill: 0 - traMADol (ULTRAM) 50 MG tablet; Take 1-2 tablets every 6 hours as needed for MODERATE pain  Dispense: 240 tablet; Refill: 5  2. Hospital discharge follow-up  Recommendations for Outpatient Follow-up: 1. Follow up outpatient CBC/CMP - ordered 2. Follow up symptoms on steroid taper - done 3. Follow up final joint fluid culture and blood cultures - done 4. Ensure follow up with rheumatology - ordered 5. Patient will need to follow up with vascular for abnormal ABI's - arterial doppler ordered 6. Follow up RLE wounds, discharged with 3  days abx, reevaluate to  consider whether patient needs longer course, but suspect her presentation and symptoms were related to RA flare as opposed to sepsis due to wound infection - agree 7. Follow up blood sugars on steroid taper - stable 8. Follow up imaging - done 9. AntiCCP pending at d/c - positive  Discharge Diagnoses: Principal Problem: SIRS (systemic inflammatory response syndrome) (HCC) Active Problems: Hyperlipidemia Reactive airway disease Gout Fibromyalgia HTN (hypertension) CAD (coronary artery disease) Rheumatoid arthritis (HCC) Stroke (HCC) CKD (chronic kidney disease) stage 3, GFR 30-59 ml/min (HCC) DM (diabetes mellitus), type 2 with renal complications (HCC) Acute on chronic diastolic heart failure ([place])  Medication reconciliation:  [x]   Medication list updated [x]   New medication list given to patient/family/caregiver  Referrals: []   None needed [x]   Referrals made to: Vascular, Rheumatology   Community resources identified for patient/family:  []   None needed  []   Home health agency []   Assisted living  []   Hospice  []   Support group  []   Education program  Durable medical equipment ordered:  []   None needed  []   DME ordered:   Additional communication delivered or planned:  [x]   Family/Caregiver:  Daughter []   Specialists:  []   Other:  Patient education: Topics discussed: AS ABOVE Handouts given: SEE AVS  Initial transitional care contact was made on 11/30/17 (see separate note).  3. Adhesive capsulitis of right shoulder Improved after injection with Dr. Paulla Fore at last visit.  4. CKD (chronic kidney disease) stage 3, GFR 30-59 ml/min (HCC)  Lab Results  Component Value Date   CREATININE 1.26 (H) 12/03/2017   CREATININE 1.14 (H) 11/29/2017   CREATININE 1.05 (H) 11/28/2017   5. Type 2 diabetes mellitus with stage 3 chronic kidney disease, without long-term current use of insulin (HCC)  Lab Results  Component Value Date   HGBA1C  5.4 11/26/2017    Lab Results  Component Value Date   CHOL 198 01/21/2017   HDL 77 01/21/2017   LDLCALC 108 (H) 01/21/2017   TRIG 65 01/21/2017   CHOLHDL 2.6 01/21/2017     Wt Readings from Last 3 Encounters:  12/03/17 189 lb 9.6 oz (86 kg)  11/26/17 185 lb 13.6 oz (84.3 kg)  10/12/17 185 lb 3.2 oz (84 kg)   BP Readings from Last 3 Encounters:  12/03/17 (!) 164/78  11/29/17 (!) 171/100  10/12/17 (!) 150/82   Lab Results  Component Value Date   CREATININE 1.26 (H) 12/03/2017   - CBC with Differential/Platelet - Comprehensive metabolic panel   . Reviewed expectations re: course of current medical issues. . Discussed self-management of symptoms. . Outlined signs and symptoms indicating need for more acute intervention. . Patient verbalized understanding and all questions were answered. Marland Kitchen Health Maintenance issues including appropriate healthy diet, exercise, and smoking avoidance were discussed with patient. . See orders for this visit as documented in the electronic medical record. . Patient received an After Visit Summary.  CMA served as Education administrator during this visit. History, Physical, and Plan performed by medical provider. The above documentation has been reviewed and is accurate and complete. Briscoe Deutscher, D.O.   Briscoe Deutscher, DO [place] [place] [facility] 12/03/2017

## 2017-12-03 NOTE — Telephone Encounter (Signed)
See note

## 2017-12-03 NOTE — Telephone Encounter (Signed)
Please advise 

## 2017-12-03 NOTE — Telephone Encounter (Signed)
Copied from CRM [id number]. Topic: General - Other >> Dec 03, 2017  2:33 PM Cecelia Byars, RMA wrote: Reason for CRM: Donita from [facility] is calling requesting verbal orders for resumption of care following hospitalization, please return call to [facility] at [phone]

## 2017-12-03 NOTE — Telephone Encounter (Signed)
Okay for order?

## 2017-12-06 ENCOUNTER — Telehealth: Payer: Self-pay

## 2017-12-06 NOTE — Telephone Encounter (Signed)
Copied from CRM [id number]. >> Dec 06, 2017 10:23 AM Raquel Sarna wrote: Lorrin Mais - [facility] - [phone]  Verbal Order: Social work evaluation

## 2017-12-06 NOTE — Telephone Encounter (Signed)
Sure

## 2017-12-06 NOTE — Telephone Encounter (Signed)
Any way you can call and see if she can be seen sooner?

## 2017-12-06 NOTE — Telephone Encounter (Signed)
Please advise 

## 2017-12-06 NOTE — Telephone Encounter (Signed)
Gave verbal order for both social worker and to continue care.

## 2017-12-06 NOTE — Telephone Encounter (Signed)
Patient has app with Dr. Erroll Luna 01/04/18. Do you want me to call and see if we can get seen sooner?

## 2017-12-06 NOTE — Telephone Encounter (Signed)
Copied from CRM [id number]. Topic: General - Other >> Dec 06, 2017 10:23 AM Raquel Sarna wrote: Lorrin Mais - [facility] - [phone]  Verbal Order: Social work evaluation

## 2017-12-08 DIAGNOSIS — L97819 Non-pressure chronic ulcer of other part of right lower leg with unspecified severity: Secondary | ICD-10-CM | POA: Diagnosis not present

## 2017-12-08 DIAGNOSIS — R569 Unspecified convulsions: Secondary | ICD-10-CM | POA: Diagnosis not present

## 2017-12-08 DIAGNOSIS — Z7982 Long term (current) use of aspirin: Secondary | ICD-10-CM | POA: Diagnosis not present

## 2017-12-08 DIAGNOSIS — E1122 Type 2 diabetes mellitus with diabetic chronic kidney disease: Secondary | ICD-10-CM | POA: Diagnosis not present

## 2017-12-08 DIAGNOSIS — Z7984 Long term (current) use of oral hypoglycemic drugs: Secondary | ICD-10-CM | POA: Diagnosis not present

## 2017-12-08 DIAGNOSIS — Z96653 Presence of artificial knee joint, bilateral: Secondary | ICD-10-CM | POA: Diagnosis not present

## 2017-12-08 DIAGNOSIS — I2510 Atherosclerotic heart disease of native coronary artery without angina pectoris: Secondary | ICD-10-CM | POA: Diagnosis not present

## 2017-12-08 DIAGNOSIS — M1990 Unspecified osteoarthritis, unspecified site: Secondary | ICD-10-CM | POA: Diagnosis not present

## 2017-12-08 DIAGNOSIS — M109 Gout, unspecified: Secondary | ICD-10-CM | POA: Diagnosis not present

## 2017-12-08 DIAGNOSIS — Z8673 Personal history of transient ischemic attack (TIA), and cerebral infarction without residual deficits: Secondary | ICD-10-CM | POA: Diagnosis not present

## 2017-12-08 DIAGNOSIS — N183 Chronic kidney disease, stage 3 (moderate): Secondary | ICD-10-CM | POA: Diagnosis not present

## 2017-12-08 DIAGNOSIS — Z7952 Long term (current) use of systemic steroids: Secondary | ICD-10-CM | POA: Diagnosis not present

## 2017-12-08 DIAGNOSIS — L97319 Non-pressure chronic ulcer of right ankle with unspecified severity: Secondary | ICD-10-CM | POA: Diagnosis not present

## 2017-12-08 DIAGNOSIS — L97219 Non-pressure chronic ulcer of right calf with unspecified severity: Secondary | ICD-10-CM | POA: Diagnosis not present

## 2017-12-08 DIAGNOSIS — I252 Old myocardial infarction: Secondary | ICD-10-CM | POA: Diagnosis not present

## 2017-12-08 DIAGNOSIS — E11622 Type 2 diabetes mellitus with other skin ulcer: Secondary | ICD-10-CM | POA: Diagnosis not present

## 2017-12-08 DIAGNOSIS — I129 Hypertensive chronic kidney disease with stage 1 through stage 4 chronic kidney disease, or unspecified chronic kidney disease: Secondary | ICD-10-CM | POA: Diagnosis not present

## 2017-12-08 NOTE — Telephone Encounter (Signed)
I do not know what she is being seen for, or to which provider outside of "Dr. Erroll Luna". Please advise.

## 2017-12-08 NOTE — Telephone Encounter (Signed)
It is Rheumatology at [facility] phone number is [phone]

## 2017-12-10 ENCOUNTER — Telehealth: Payer: Self-pay | Admitting: Family Medicine

## 2017-12-10 ENCOUNTER — Ambulatory Visit: Payer: Self-pay | Admitting: *Deleted

## 2017-12-10 ENCOUNTER — Encounter: Payer: Self-pay | Admitting: Sports Medicine

## 2017-12-10 DIAGNOSIS — Z96653 Presence of artificial knee joint, bilateral: Secondary | ICD-10-CM | POA: Diagnosis not present

## 2017-12-10 DIAGNOSIS — I2510 Atherosclerotic heart disease of native coronary artery without angina pectoris: Secondary | ICD-10-CM | POA: Diagnosis not present

## 2017-12-10 DIAGNOSIS — R569 Unspecified convulsions: Secondary | ICD-10-CM | POA: Diagnosis not present

## 2017-12-10 DIAGNOSIS — E11622 Type 2 diabetes mellitus with other skin ulcer: Secondary | ICD-10-CM | POA: Diagnosis not present

## 2017-12-10 DIAGNOSIS — E1122 Type 2 diabetes mellitus with diabetic chronic kidney disease: Secondary | ICD-10-CM | POA: Diagnosis not present

## 2017-12-10 DIAGNOSIS — I129 Hypertensive chronic kidney disease with stage 1 through stage 4 chronic kidney disease, or unspecified chronic kidney disease: Secondary | ICD-10-CM | POA: Diagnosis not present

## 2017-12-10 DIAGNOSIS — Z7952 Long term (current) use of systemic steroids: Secondary | ICD-10-CM | POA: Diagnosis not present

## 2017-12-10 DIAGNOSIS — I252 Old myocardial infarction: Secondary | ICD-10-CM | POA: Diagnosis not present

## 2017-12-10 DIAGNOSIS — L97819 Non-pressure chronic ulcer of other part of right lower leg with unspecified severity: Secondary | ICD-10-CM | POA: Diagnosis not present

## 2017-12-10 DIAGNOSIS — M1990 Unspecified osteoarthritis, unspecified site: Secondary | ICD-10-CM | POA: Diagnosis not present

## 2017-12-10 DIAGNOSIS — L97319 Non-pressure chronic ulcer of right ankle with unspecified severity: Secondary | ICD-10-CM | POA: Diagnosis not present

## 2017-12-10 DIAGNOSIS — I10 Essential (primary) hypertension: Secondary | ICD-10-CM | POA: Diagnosis not present

## 2017-12-10 DIAGNOSIS — L88 Pyoderma gangrenosum: Secondary | ICD-10-CM | POA: Diagnosis not present

## 2017-12-10 DIAGNOSIS — Z8673 Personal history of transient ischemic attack (TIA), and cerebral infarction without residual deficits: Secondary | ICD-10-CM | POA: Diagnosis not present

## 2017-12-10 DIAGNOSIS — Z7984 Long term (current) use of oral hypoglycemic drugs: Secondary | ICD-10-CM | POA: Diagnosis not present

## 2017-12-10 DIAGNOSIS — M109 Gout, unspecified: Secondary | ICD-10-CM | POA: Diagnosis not present

## 2017-12-10 DIAGNOSIS — N183 Chronic kidney disease, stage 3 (moderate): Secondary | ICD-10-CM | POA: Diagnosis not present

## 2017-12-10 DIAGNOSIS — Z7982 Long term (current) use of aspirin: Secondary | ICD-10-CM | POA: Diagnosis not present

## 2017-12-10 DIAGNOSIS — M259 Joint disorder, unspecified: Secondary | ICD-10-CM | POA: Diagnosis not present

## 2017-12-10 DIAGNOSIS — L97219 Non-pressure chronic ulcer of right calf with unspecified severity: Secondary | ICD-10-CM | POA: Diagnosis not present

## 2017-12-10 DIAGNOSIS — L97212 Non-pressure chronic ulcer of right calf with fat layer exposed: Secondary | ICD-10-CM | POA: Diagnosis not present
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
## 2017-12-10 NOTE — Telephone Encounter (Signed)
Left message to call back to discuss her rash.

## 2017-12-13 DIAGNOSIS — Z7952 Long term (current) use of systemic steroids: Secondary | ICD-10-CM | POA: Diagnosis not present

## 2017-12-13 DIAGNOSIS — Z7982 Long term (current) use of aspirin: Secondary | ICD-10-CM | POA: Diagnosis not present

## 2017-12-13 DIAGNOSIS — I252 Old myocardial infarction: Secondary | ICD-10-CM | POA: Diagnosis not present

## 2017-12-13 DIAGNOSIS — E1122 Type 2 diabetes mellitus with diabetic chronic kidney disease: Secondary | ICD-10-CM | POA: Diagnosis not present

## 2017-12-13 DIAGNOSIS — E11622 Type 2 diabetes mellitus with other skin ulcer: Secondary | ICD-10-CM | POA: Diagnosis not present

## 2017-12-13 DIAGNOSIS — M109 Gout, unspecified: Secondary | ICD-10-CM | POA: Diagnosis not present

## 2017-12-13 DIAGNOSIS — N183 Chronic kidney disease, stage 3 (moderate): Secondary | ICD-10-CM | POA: Diagnosis not present

## 2017-12-13 DIAGNOSIS — Z8673 Personal history of transient ischemic attack (TIA), and cerebral infarction without residual deficits: Secondary | ICD-10-CM | POA: Diagnosis not present

## 2017-12-13 DIAGNOSIS — I129 Hypertensive chronic kidney disease with stage 1 through stage 4 chronic kidney disease, or unspecified chronic kidney disease: Secondary | ICD-10-CM | POA: Diagnosis not present

## 2017-12-13 DIAGNOSIS — I2510 Atherosclerotic heart disease of native coronary artery without angina pectoris: Secondary | ICD-10-CM | POA: Diagnosis not present

## 2017-12-13 DIAGNOSIS — L97319 Non-pressure chronic ulcer of right ankle with unspecified severity: Secondary | ICD-10-CM | POA: Diagnosis not present

## 2017-12-13 DIAGNOSIS — L97219 Non-pressure chronic ulcer of right calf with unspecified severity: Secondary | ICD-10-CM | POA: Diagnosis not present

## 2017-12-13 DIAGNOSIS — Z96653 Presence of artificial knee joint, bilateral: Secondary | ICD-10-CM | POA: Diagnosis not present

## 2017-12-13 DIAGNOSIS — R569 Unspecified convulsions: Secondary | ICD-10-CM | POA: Diagnosis not present

## 2017-12-13 DIAGNOSIS — M1990 Unspecified osteoarthritis, unspecified site: Secondary | ICD-10-CM | POA: Diagnosis not present

## 2017-12-13 DIAGNOSIS — L97819 Non-pressure chronic ulcer of other part of right lower leg with unspecified severity: Secondary | ICD-10-CM | POA: Diagnosis not present

## 2017-12-13 DIAGNOSIS — Z7984 Long term (current) use of oral hypoglycemic drugs: Secondary | ICD-10-CM | POA: Diagnosis not present

## 2017-12-13 NOTE — Telephone Encounter (Signed)
Patient has soonest available appointment. Will be unable to move it any closer. Next available per [facility] is in May of 2019.

## 2017-12-13 NOTE — Telephone Encounter (Signed)
Error..cdavis °

## 2017-12-14 ENCOUNTER — Encounter (HOSPITAL_BASED_OUTPATIENT_CLINIC_OR_DEPARTMENT_OTHER): Payer: Medicare Other | Attending: Internal Medicine

## 2017-12-14 DIAGNOSIS — E1136 Type 2 diabetes mellitus with diabetic cataract: Secondary | ICD-10-CM | POA: Insufficient documentation

## 2017-12-14 DIAGNOSIS — I129 Hypertensive chronic kidney disease with stage 1 through stage 4 chronic kidney disease, or unspecified chronic kidney disease: Secondary | ICD-10-CM | POA: Diagnosis not present

## 2017-12-14 DIAGNOSIS — I2510 Atherosclerotic heart disease of native coronary artery without angina pectoris: Secondary | ICD-10-CM | POA: Insufficient documentation

## 2017-12-14 DIAGNOSIS — Z96653 Presence of artificial knee joint, bilateral: Secondary | ICD-10-CM | POA: Insufficient documentation

## 2017-12-14 DIAGNOSIS — Z7984 Long term (current) use of oral hypoglycemic drugs: Secondary | ICD-10-CM | POA: Diagnosis not present

## 2017-12-14 DIAGNOSIS — Z8673 Personal history of transient ischemic attack (TIA), and cerebral infarction without residual deficits: Secondary | ICD-10-CM | POA: Insufficient documentation

## 2017-12-14 DIAGNOSIS — N183 Chronic kidney disease, stage 3 (moderate): Secondary | ICD-10-CM | POA: Insufficient documentation

## 2017-12-14 DIAGNOSIS — E1122 Type 2 diabetes mellitus with diabetic chronic kidney disease: Secondary | ICD-10-CM | POA: Insufficient documentation

## 2017-12-14 DIAGNOSIS — Z7952 Long term (current) use of systemic steroids: Secondary | ICD-10-CM | POA: Insufficient documentation

## 2017-12-14 DIAGNOSIS — L97212 Non-pressure chronic ulcer of right calf with fat layer exposed: Secondary | ICD-10-CM | POA: Diagnosis not present

## 2017-12-14 DIAGNOSIS — L97812 Non-pressure chronic ulcer of other part of right lower leg with fat layer exposed: Secondary | ICD-10-CM | POA: Diagnosis not present

## 2017-12-14 DIAGNOSIS — M359 Systemic involvement of connective tissue, unspecified: Secondary | ICD-10-CM | POA: Diagnosis not present

## 2017-12-14 DIAGNOSIS — M0689 Other specified rheumatoid arthritis, multiple sites: Secondary | ICD-10-CM | POA: Insufficient documentation

## 2017-12-15 DIAGNOSIS — Z7952 Long term (current) use of systemic steroids: Secondary | ICD-10-CM | POA: Diagnosis not present

## 2017-12-15 DIAGNOSIS — E11622 Type 2 diabetes mellitus with other skin ulcer: Secondary | ICD-10-CM | POA: Diagnosis not present

## 2017-12-15 DIAGNOSIS — Z7982 Long term (current) use of aspirin: Secondary | ICD-10-CM | POA: Diagnosis not present

## 2017-12-15 DIAGNOSIS — I2510 Atherosclerotic heart disease of native coronary artery without angina pectoris: Secondary | ICD-10-CM | POA: Diagnosis not present

## 2017-12-15 DIAGNOSIS — M0689 Other specified rheumatoid arthritis, multiple sites: Secondary | ICD-10-CM | POA: Diagnosis not present

## 2017-12-15 DIAGNOSIS — L97819 Non-pressure chronic ulcer of other part of right lower leg with unspecified severity: Secondary | ICD-10-CM | POA: Diagnosis not present

## 2017-12-15 DIAGNOSIS — Z8673 Personal history of transient ischemic attack (TIA), and cerebral infarction without residual deficits: Secondary | ICD-10-CM | POA: Diagnosis not present

## 2017-12-15 DIAGNOSIS — R6510 Systemic inflammatory response syndrome (SIRS) of non-infectious origin without acute organ dysfunction: Secondary | ICD-10-CM | POA: Diagnosis not present

## 2017-12-15 DIAGNOSIS — E1122 Type 2 diabetes mellitus with diabetic chronic kidney disease: Secondary | ICD-10-CM | POA: Diagnosis not present

## 2017-12-15 DIAGNOSIS — R569 Unspecified convulsions: Secondary | ICD-10-CM | POA: Diagnosis not present

## 2017-12-15 DIAGNOSIS — L97219 Non-pressure chronic ulcer of right calf with unspecified severity: Secondary | ICD-10-CM | POA: Diagnosis not present

## 2017-12-15 DIAGNOSIS — N183 Chronic kidney disease, stage 3 (moderate): Secondary | ICD-10-CM | POA: Diagnosis not present

## 2017-12-15 DIAGNOSIS — M1990 Unspecified osteoarthritis, unspecified site: Secondary | ICD-10-CM | POA: Diagnosis not present

## 2017-12-15 DIAGNOSIS — Z7984 Long term (current) use of oral hypoglycemic drugs: Secondary | ICD-10-CM | POA: Diagnosis not present

## 2017-12-15 DIAGNOSIS — I130 Hypertensive heart and chronic kidney disease with heart failure and stage 1 through stage 4 chronic kidney disease, or unspecified chronic kidney disease: Secondary | ICD-10-CM | POA: Diagnosis not present

## 2017-12-15 DIAGNOSIS — M109 Gout, unspecified: Secondary | ICD-10-CM | POA: Diagnosis not present

## 2017-12-15 DIAGNOSIS — Z96653 Presence of artificial knee joint, bilateral: Secondary | ICD-10-CM | POA: Diagnosis not present

## 2017-12-15 DIAGNOSIS — I252 Old myocardial infarction: Secondary | ICD-10-CM | POA: Diagnosis not present

## 2017-12-15 DIAGNOSIS — I5023 Acute on chronic systolic (congestive) heart failure: Secondary | ICD-10-CM | POA: Diagnosis not present

## 2017-12-20 DIAGNOSIS — Z8673 Personal history of transient ischemic attack (TIA), and cerebral infarction without residual deficits: Secondary | ICD-10-CM | POA: Diagnosis not present

## 2017-12-20 DIAGNOSIS — E1122 Type 2 diabetes mellitus with diabetic chronic kidney disease: Secondary | ICD-10-CM | POA: Diagnosis not present

## 2017-12-20 DIAGNOSIS — R6510 Systemic inflammatory response syndrome (SIRS) of non-infectious origin without acute organ dysfunction: Secondary | ICD-10-CM | POA: Diagnosis not present

## 2017-12-20 DIAGNOSIS — M109 Gout, unspecified: Secondary | ICD-10-CM | POA: Diagnosis not present

## 2017-12-20 DIAGNOSIS — M0689 Other specified rheumatoid arthritis, multiple sites: Secondary | ICD-10-CM | POA: Diagnosis not present

## 2017-12-20 DIAGNOSIS — L97219 Non-pressure chronic ulcer of right calf with unspecified severity: Secondary | ICD-10-CM | POA: Diagnosis not present

## 2017-12-20 DIAGNOSIS — I5023 Acute on chronic systolic (congestive) heart failure: Secondary | ICD-10-CM | POA: Diagnosis not present

## 2017-12-20 DIAGNOSIS — I252 Old myocardial infarction: Secondary | ICD-10-CM | POA: Diagnosis not present

## 2017-12-20 DIAGNOSIS — Z7952 Long term (current) use of systemic steroids: Secondary | ICD-10-CM | POA: Diagnosis not present

## 2017-12-20 DIAGNOSIS — E11622 Type 2 diabetes mellitus with other skin ulcer: Secondary | ICD-10-CM | POA: Diagnosis not present

## 2017-12-20 DIAGNOSIS — Z7982 Long term (current) use of aspirin: Secondary | ICD-10-CM | POA: Diagnosis not present

## 2017-12-20 DIAGNOSIS — M1990 Unspecified osteoarthritis, unspecified site: Secondary | ICD-10-CM | POA: Diagnosis not present

## 2017-12-20 DIAGNOSIS — Z96653 Presence of artificial knee joint, bilateral: Secondary | ICD-10-CM | POA: Diagnosis not present

## 2017-12-20 DIAGNOSIS — Z7984 Long term (current) use of oral hypoglycemic drugs: Secondary | ICD-10-CM | POA: Diagnosis not present

## 2017-12-20 DIAGNOSIS — R569 Unspecified convulsions: Secondary | ICD-10-CM | POA: Diagnosis not present

## 2017-12-20 DIAGNOSIS — L97819 Non-pressure chronic ulcer of other part of right lower leg with unspecified severity: Secondary | ICD-10-CM | POA: Diagnosis not present

## 2017-12-20 DIAGNOSIS — I130 Hypertensive heart and chronic kidney disease with heart failure and stage 1 through stage 4 chronic kidney disease, or unspecified chronic kidney disease: Secondary | ICD-10-CM | POA: Diagnosis not present

## 2017-12-20 DIAGNOSIS — N183 Chronic kidney disease, stage 3 (moderate): Secondary | ICD-10-CM | POA: Diagnosis not present

## 2017-12-20 DIAGNOSIS — I2510 Atherosclerotic heart disease of native coronary artery without angina pectoris: Secondary | ICD-10-CM | POA: Diagnosis not present

## 2017-12-21 ENCOUNTER — Encounter: Payer: Self-pay | Admitting: Podiatry

## 2017-12-21 ENCOUNTER — Ambulatory Visit (INDEPENDENT_AMBULATORY_CARE_PROVIDER_SITE_OTHER): Payer: Medicare Other | Admitting: Podiatry

## 2017-12-21 DIAGNOSIS — N183 Chronic kidney disease, stage 3 (moderate): Secondary | ICD-10-CM | POA: Diagnosis not present

## 2017-12-21 DIAGNOSIS — I252 Old myocardial infarction: Secondary | ICD-10-CM | POA: Diagnosis not present

## 2017-12-21 DIAGNOSIS — M79672 Pain in left foot: Secondary | ICD-10-CM

## 2017-12-21 DIAGNOSIS — M2040 Other hammer toe(s) (acquired), unspecified foot: Secondary | ICD-10-CM

## 2017-12-21 DIAGNOSIS — M1990 Unspecified osteoarthritis, unspecified site: Secondary | ICD-10-CM | POA: Diagnosis not present

## 2017-12-21 DIAGNOSIS — I2510 Atherosclerotic heart disease of native coronary artery without angina pectoris: Secondary | ICD-10-CM | POA: Diagnosis not present

## 2017-12-21 DIAGNOSIS — L97819 Non-pressure chronic ulcer of other part of right lower leg with unspecified severity: Secondary | ICD-10-CM | POA: Diagnosis not present

## 2017-12-21 DIAGNOSIS — M109 Gout, unspecified: Secondary | ICD-10-CM | POA: Diagnosis not present

## 2017-12-21 DIAGNOSIS — M79671 Pain in right foot: Secondary | ICD-10-CM | POA: Diagnosis not present

## 2017-12-21 DIAGNOSIS — B351 Tinea unguium: Secondary | ICD-10-CM | POA: Diagnosis not present

## 2017-12-21 DIAGNOSIS — M0689 Other specified rheumatoid arthritis, multiple sites: Secondary | ICD-10-CM | POA: Diagnosis not present

## 2017-12-21 DIAGNOSIS — Z8673 Personal history of transient ischemic attack (TIA), and cerebral infarction without residual deficits: Secondary | ICD-10-CM | POA: Diagnosis not present

## 2017-12-21 DIAGNOSIS — Z7984 Long term (current) use of oral hypoglycemic drugs: Secondary | ICD-10-CM | POA: Diagnosis not present

## 2017-12-21 DIAGNOSIS — I130 Hypertensive heart and chronic kidney disease with heart failure and stage 1 through stage 4 chronic kidney disease, or unspecified chronic kidney disease: Secondary | ICD-10-CM | POA: Diagnosis not present

## 2017-12-21 DIAGNOSIS — Z7982 Long term (current) use of aspirin: Secondary | ICD-10-CM | POA: Diagnosis not present

## 2017-12-21 DIAGNOSIS — L97219 Non-pressure chronic ulcer of right calf with unspecified severity: Secondary | ICD-10-CM | POA: Diagnosis not present

## 2017-12-21 DIAGNOSIS — Z7952 Long term (current) use of systemic steroids: Secondary | ICD-10-CM | POA: Diagnosis not present

## 2017-12-21 DIAGNOSIS — I5023 Acute on chronic systolic (congestive) heart failure: Secondary | ICD-10-CM | POA: Diagnosis not present

## 2017-12-21 DIAGNOSIS — E11622 Type 2 diabetes mellitus with other skin ulcer: Secondary | ICD-10-CM | POA: Diagnosis not present

## 2017-12-21 DIAGNOSIS — Z96653 Presence of artificial knee joint, bilateral: Secondary | ICD-10-CM | POA: Diagnosis not present

## 2017-12-21 DIAGNOSIS — R6510 Systemic inflammatory response syndrome (SIRS) of non-infectious origin without acute organ dysfunction: Secondary | ICD-10-CM | POA: Diagnosis not present

## 2017-12-21 DIAGNOSIS — R569 Unspecified convulsions: Secondary | ICD-10-CM | POA: Diagnosis not present

## 2017-12-21 DIAGNOSIS — E1122 Type 2 diabetes mellitus with diabetic chronic kidney disease: Secondary | ICD-10-CM | POA: Diagnosis not present

## 2017-12-21 NOTE — Progress Notes (Signed)
Subjective: 84 y.o. year old female patient presents complaining of painful nails. Patient requests toe nails trimmed.  Been in hospital for possible acute septic condition. Daughter requested for toe separator.  Objective: Dermatologic: Thick yellow deformed nails x 10. Painful digital corn 4th digit left.  Left lower limb covered with Ace wrap for rheumatoid wound. Vascular: Pedal pulses are all palpable. Orthopedic: Contracted lesser digits 4th left with lesion. Neurologic: All epicritic and tactile sensations grossly intact.  Assessment: Dystrophic mycotic nails x 10. Severe hammer toe with digital corn 4th left.  Treatment: All mycotic nails and corns debrided.  Return in 3 months or as needed. Gel toe separator dispensed x 2.

## 2017-12-21 NOTE — Patient Instructions (Signed)
Seen for hypertrophic nails. All nails debrided. Return in 3 months or as needed.  

## 2017-12-22 ENCOUNTER — Telehealth: Payer: Self-pay | Admitting: Family Medicine

## 2017-12-22 NOTE — Telephone Encounter (Signed)
Ok to give orders  °

## 2017-12-22 NOTE — Telephone Encounter (Signed)
One Touch Test Strips refill Last OV: 12/03/17 Last Refill: 07/2017 Pharmacy:[facility] on [address]   Contacted the pharmacy to inquire about the quantity pt. has been receiving; informed that she has been getting qty. 100.   Was advised MD needs to send new Rx to reflect that pt. checking glucose qid., then the pharmacy will send a "DWO form" to be completed.

## 2017-12-22 NOTE — Telephone Encounter (Signed)
Okay 

## 2017-12-22 NOTE — Telephone Encounter (Signed)
See note

## 2017-12-22 NOTE — Telephone Encounter (Signed)
Copied from CRM [id number]. Topic: General - Other >> Dec 22, 2017 11:23 AM Windy Kalata, NT wrote: [name] [name] is calling from [facility] and is requesting verbal orders for occupational therapy for 2x a week for 3 weeks.  Ritu Cb# [phone]

## 2017-12-22 NOTE — Telephone Encounter (Signed)
Copied from CRM [id number]. Topic: Quick Communication - Rx Refill/Question >> Dec 22, 2017  8:10 AM Oneal Grout wrote: Medication:OneTouch Test strips   Has the patient contacted their pharmacy? Yes.     (Agent: If no, request that the patient contact the pharmacy for the refill.)   Preferred Pharmacy (with phone number or street name): [facility] on [address]   Agent: Please be advised that RX refills may take up to 3 business days. We ask that you follow-up with your pharmacy.

## 2017-12-23 DIAGNOSIS — Z7982 Long term (current) use of aspirin: Secondary | ICD-10-CM | POA: Diagnosis not present

## 2017-12-23 DIAGNOSIS — I130 Hypertensive heart and chronic kidney disease with heart failure and stage 1 through stage 4 chronic kidney disease, or unspecified chronic kidney disease: Secondary | ICD-10-CM | POA: Diagnosis not present

## 2017-12-23 DIAGNOSIS — L97819 Non-pressure chronic ulcer of other part of right lower leg with unspecified severity: Secondary | ICD-10-CM | POA: Diagnosis not present

## 2017-12-23 DIAGNOSIS — Z8673 Personal history of transient ischemic attack (TIA), and cerebral infarction without residual deficits: Secondary | ICD-10-CM | POA: Diagnosis not present

## 2017-12-23 DIAGNOSIS — E1122 Type 2 diabetes mellitus with diabetic chronic kidney disease: Secondary | ICD-10-CM | POA: Diagnosis not present

## 2017-12-23 DIAGNOSIS — E11622 Type 2 diabetes mellitus with other skin ulcer: Secondary | ICD-10-CM | POA: Diagnosis not present

## 2017-12-23 DIAGNOSIS — I5023 Acute on chronic systolic (congestive) heart failure: Secondary | ICD-10-CM | POA: Diagnosis not present

## 2017-12-23 DIAGNOSIS — M109 Gout, unspecified: Secondary | ICD-10-CM | POA: Diagnosis not present

## 2017-12-23 DIAGNOSIS — Z7952 Long term (current) use of systemic steroids: Secondary | ICD-10-CM | POA: Diagnosis not present

## 2017-12-23 DIAGNOSIS — I252 Old myocardial infarction: Secondary | ICD-10-CM | POA: Diagnosis not present

## 2017-12-23 DIAGNOSIS — Z7984 Long term (current) use of oral hypoglycemic drugs: Secondary | ICD-10-CM | POA: Diagnosis not present

## 2017-12-23 DIAGNOSIS — Z96653 Presence of artificial knee joint, bilateral: Secondary | ICD-10-CM | POA: Diagnosis not present

## 2017-12-23 DIAGNOSIS — I2510 Atherosclerotic heart disease of native coronary artery without angina pectoris: Secondary | ICD-10-CM | POA: Diagnosis not present

## 2017-12-23 DIAGNOSIS — M1990 Unspecified osteoarthritis, unspecified site: Secondary | ICD-10-CM | POA: Diagnosis not present

## 2017-12-23 DIAGNOSIS — R6510 Systemic inflammatory response syndrome (SIRS) of non-infectious origin without acute organ dysfunction: Secondary | ICD-10-CM | POA: Diagnosis not present

## 2017-12-23 DIAGNOSIS — L97219 Non-pressure chronic ulcer of right calf with unspecified severity: Secondary | ICD-10-CM | POA: Diagnosis not present

## 2017-12-23 DIAGNOSIS — R569 Unspecified convulsions: Secondary | ICD-10-CM | POA: Diagnosis not present

## 2017-12-23 DIAGNOSIS — M0689 Other specified rheumatoid arthritis, multiple sites: Secondary | ICD-10-CM | POA: Diagnosis not present

## 2017-12-23 DIAGNOSIS — N183 Chronic kidney disease, stage 3 (moderate): Secondary | ICD-10-CM | POA: Diagnosis not present

## 2017-12-23 NOTE — Telephone Encounter (Signed)
Verbal given to Ritu will call if any problems.

## 2017-12-24 ENCOUNTER — Encounter: Payer: Medicare Other | Admitting: Vascular Surgery

## 2017-12-24 ENCOUNTER — Other Ambulatory Visit: Payer: Self-pay

## 2017-12-24 DIAGNOSIS — L97819 Non-pressure chronic ulcer of other part of right lower leg with unspecified severity: Secondary | ICD-10-CM | POA: Diagnosis not present

## 2017-12-24 DIAGNOSIS — I5023 Acute on chronic systolic (congestive) heart failure: Secondary | ICD-10-CM | POA: Diagnosis not present

## 2017-12-24 DIAGNOSIS — L97219 Non-pressure chronic ulcer of right calf with unspecified severity: Secondary | ICD-10-CM | POA: Diagnosis not present

## 2017-12-24 DIAGNOSIS — Z7952 Long term (current) use of systemic steroids: Secondary | ICD-10-CM | POA: Diagnosis not present

## 2017-12-24 DIAGNOSIS — Z7982 Long term (current) use of aspirin: Secondary | ICD-10-CM | POA: Diagnosis not present

## 2017-12-24 DIAGNOSIS — N183 Chronic kidney disease, stage 3 (moderate): Secondary | ICD-10-CM | POA: Diagnosis not present

## 2017-12-24 DIAGNOSIS — M0689 Other specified rheumatoid arthritis, multiple sites: Secondary | ICD-10-CM | POA: Diagnosis not present

## 2017-12-24 DIAGNOSIS — R569 Unspecified convulsions: Secondary | ICD-10-CM | POA: Diagnosis not present

## 2017-12-24 DIAGNOSIS — M109 Gout, unspecified: Secondary | ICD-10-CM | POA: Diagnosis not present

## 2017-12-24 DIAGNOSIS — R6510 Systemic inflammatory response syndrome (SIRS) of non-infectious origin without acute organ dysfunction: Secondary | ICD-10-CM | POA: Diagnosis not present

## 2017-12-24 DIAGNOSIS — E11622 Type 2 diabetes mellitus with other skin ulcer: Secondary | ICD-10-CM | POA: Diagnosis not present

## 2017-12-24 DIAGNOSIS — Z96653 Presence of artificial knee joint, bilateral: Secondary | ICD-10-CM | POA: Diagnosis not present

## 2017-12-24 DIAGNOSIS — I2510 Atherosclerotic heart disease of native coronary artery without angina pectoris: Secondary | ICD-10-CM | POA: Diagnosis not present

## 2017-12-24 DIAGNOSIS — Z7984 Long term (current) use of oral hypoglycemic drugs: Secondary | ICD-10-CM | POA: Diagnosis not present

## 2017-12-24 DIAGNOSIS — I130 Hypertensive heart and chronic kidney disease with heart failure and stage 1 through stage 4 chronic kidney disease, or unspecified chronic kidney disease: Secondary | ICD-10-CM | POA: Diagnosis not present

## 2017-12-24 DIAGNOSIS — M1990 Unspecified osteoarthritis, unspecified site: Secondary | ICD-10-CM | POA: Diagnosis not present

## 2017-12-24 DIAGNOSIS — E1122 Type 2 diabetes mellitus with diabetic chronic kidney disease: Secondary | ICD-10-CM | POA: Diagnosis not present

## 2017-12-24 DIAGNOSIS — Z8673 Personal history of transient ischemic attack (TIA), and cerebral infarction without residual deficits: Secondary | ICD-10-CM | POA: Diagnosis not present

## 2017-12-24 DIAGNOSIS — I252 Old myocardial infarction: Secondary | ICD-10-CM | POA: Diagnosis not present

## 2017-12-24 MED ORDER — GLUCOSE BLOOD VI STRP: ORAL_STRIP | 6 refills | Status: DC

## 2017-12-24 NOTE — Telephone Encounter (Signed)
New script sent in

## 2017-12-27 ENCOUNTER — Encounter: Payer: Self-pay | Admitting: Sports Medicine

## 2017-12-27 ENCOUNTER — Ambulatory Visit (INDEPENDENT_AMBULATORY_CARE_PROVIDER_SITE_OTHER): Payer: Medicare Other | Admitting: Sports Medicine

## 2017-12-27 VITALS — BP 142/88 | HR 88 | Ht 64.0 in | Wt 190.2 lb

## 2017-12-27 DIAGNOSIS — M0609 Rheumatoid arthritis without rheumatoid factor, multiple sites: Secondary | ICD-10-CM

## 2017-12-27 DIAGNOSIS — N183 Chronic kidney disease, stage 3 unspecified: Secondary | ICD-10-CM

## 2017-12-27 DIAGNOSIS — I5023 Acute on chronic systolic (congestive) heart failure: Secondary | ICD-10-CM | POA: Diagnosis not present

## 2017-12-27 DIAGNOSIS — M1990 Unspecified osteoarthritis, unspecified site: Secondary | ICD-10-CM | POA: Diagnosis not present

## 2017-12-27 DIAGNOSIS — R6510 Systemic inflammatory response syndrome (SIRS) of non-infectious origin without acute organ dysfunction: Secondary | ICD-10-CM | POA: Diagnosis not present

## 2017-12-27 DIAGNOSIS — M0689 Other specified rheumatoid arthritis, multiple sites: Secondary | ICD-10-CM | POA: Diagnosis not present

## 2017-12-27 DIAGNOSIS — Z7982 Long term (current) use of aspirin: Secondary | ICD-10-CM | POA: Diagnosis not present

## 2017-12-27 DIAGNOSIS — Z8673 Personal history of transient ischemic attack (TIA), and cerebral infarction without residual deficits: Secondary | ICD-10-CM | POA: Diagnosis not present

## 2017-12-27 DIAGNOSIS — Z7984 Long term (current) use of oral hypoglycemic drugs: Secondary | ICD-10-CM | POA: Diagnosis not present

## 2017-12-27 DIAGNOSIS — E1122 Type 2 diabetes mellitus with diabetic chronic kidney disease: Secondary | ICD-10-CM | POA: Diagnosis not present

## 2017-12-27 DIAGNOSIS — M24611 Ankylosis, right shoulder: Secondary | ICD-10-CM

## 2017-12-27 DIAGNOSIS — M109 Gout, unspecified: Secondary | ICD-10-CM

## 2017-12-27 DIAGNOSIS — L97819 Non-pressure chronic ulcer of other part of right lower leg with unspecified severity: Secondary | ICD-10-CM | POA: Diagnosis not present

## 2017-12-27 DIAGNOSIS — Z7952 Long term (current) use of systemic steroids: Secondary | ICD-10-CM | POA: Diagnosis not present

## 2017-12-27 DIAGNOSIS — I130 Hypertensive heart and chronic kidney disease with heart failure and stage 1 through stage 4 chronic kidney disease, or unspecified chronic kidney disease: Secondary | ICD-10-CM | POA: Diagnosis not present

## 2017-12-27 DIAGNOSIS — E11622 Type 2 diabetes mellitus with other skin ulcer: Secondary | ICD-10-CM | POA: Diagnosis not present

## 2017-12-27 DIAGNOSIS — R569 Unspecified convulsions: Secondary | ICD-10-CM | POA: Diagnosis not present

## 2017-12-27 DIAGNOSIS — Z96653 Presence of artificial knee joint, bilateral: Secondary | ICD-10-CM | POA: Diagnosis not present

## 2017-12-27 DIAGNOSIS — I2510 Atherosclerotic heart disease of native coronary artery without angina pectoris: Secondary | ICD-10-CM | POA: Diagnosis not present

## 2017-12-27 DIAGNOSIS — L97219 Non-pressure chronic ulcer of right calf with unspecified severity: Secondary | ICD-10-CM | POA: Diagnosis not present

## 2017-12-27 DIAGNOSIS — I252 Old myocardial infarction: Secondary | ICD-10-CM | POA: Diagnosis not present

## 2017-12-27 NOTE — Progress Notes (Signed)
[name] [name]. [name] [name] [facility] at [facility] [phone]  [name] [name] - 84 y.o. female MRN [id number]  Date of birth: 1930/02/18  Visit Date: 12/27/2017  PCP: Helane Rima, DO   Referred by: Helane Rima, DO   Scribe for today's visit: Stevenson Clinch, CMA     SUBJECTIVE:  [name] [name] is here for Follow-up (RT shoulder pain, adhesive capsulitis )  Compared to the last office visit, her previously described symptoms are improving, she reports that everything has completely resolved. She reports improved ROM, no weakness in the RT arm, no clicking popping or catching.    ROS Denies night time disturbances. Denies fevers, chills, or night sweats. Denies unexplained weight loss. Denies personal history of cancer. Denies changes in bowel or bladder habits. Denies recent unreported falls. Denies new or worsening dyspnea or wheezing. Reports headaches (d/t BP and allergies).  Reports numbness, tingling or weakness  In the extremities (RA).  Denies dizziness or presyncopal episodes Denies lower extremity edema     HISTORY & PERTINENT PRIOR DATA:  Prior History reviewed and updated per electronic medical record.  Significant history, findings, studies and interim changes include:  reports that  has never smoked. she has never used smokeless tobacco. Recent Labs    09/13/17 1343 11/26/17 2201 11/26/17 2217  HGBA1C 5.7  --  5.4  LABURIC 5.4 4.6  --    Patient with Hx of fluctuating blood pressures. Always asymptomatic. Blood pressure in PCP office generally controlled. Plan: No advancement of BP medications to avoid hypOtension. No problems updated.  OBJECTIVE:  VS:  HT:5\' 4"  (162.6 cm)   WT:190 lb 3.2 oz (86.3 kg)  BMI:32.63    BP:(!) 142/88  HR:88bpm  TEMP: ( )  RESP:96 %   PHYSICAL EXAM: Constitutional: WDWN, Non-toxic appearing. Psychiatric: Alert & appropriately interactive.  Not depressed or anxious  appearing. Respiratory: No increased work of breathing.  Trachea Midline Eyes: Pupils are equal.  EOM intact without nystagmus.  No scleral icterus  NEUROVASCULAR exam: No clubbing or cyanosis appreciated No significant venous stasis changes Capillary Refill: normal, less than 2 seconds   Right shoulder and arm has generalized osteoarthritic bossing with significant changes consistent with rheumatoid changes within her hands including swan-neck deformities and significant cystic changes.  She has marked loss of range of motion with her right shoulder including only a 40 degree arc while held at 30 degrees of abduction.  She has coarse crepitation and grinding with axial load and circumduction as well as with active and passive shoulder abduction and forward flexion.  Radial pulses are palpable.  She has a small prominence within the anterior axillary fold consistent with with the prior cystic change that is significantly smaller and less tender than in the past  No additional findings.   ASSESSMENT & PLAN:   1. Rheumatoid arthritis of multiple sites with negative rheumatoid factor (HCC)   2. CKD (chronic kidney disease) stage 3, GFR 30-59 ml/min (HCC)   3. Gout, unspecified cause, unspecified chronicity, unspecified site   4. Ankylosis of shoulder joint, right    PLAN: Her shoulder seems to be doing better from a symptomatic standpoint.  She is on systemic steroids following acute hospitalization secondary to acute systemic flareup of her rheumatoid arthritis.  She has been followed at [facility] for her rheumatologic conditions I did discuss that I am happy to see her back for any specific joint complaints for injections.  Ultimately  her shoulder has significant degenerative changes as outlined on prior MSK ultrasound and x-rays obtained in the hospital and further intervention could be considered/entertained given the severity of her systemic symptoms symptomatic treatment is  indicated at this time.  ++++++++++++++++++++++++++++++++++++++++++++ Follow-up: Return if symptoms worsen or fail to improve.   Pertinent documentation may be included in additional procedure notes, imaging studies, problem based documentation and patient instructions. Please see these sections of the encounter for additional information regarding this visit. CMA/ATC served as Neurosurgeon during this visit. History, Physical, and Plan performed by medical provider. Documentation and orders reviewed and attested to.      [name] Mews, DO    [facility]

## 2017-12-28 DIAGNOSIS — I130 Hypertensive heart and chronic kidney disease with heart failure and stage 1 through stage 4 chronic kidney disease, or unspecified chronic kidney disease: Secondary | ICD-10-CM | POA: Diagnosis not present

## 2017-12-28 DIAGNOSIS — Z96653 Presence of artificial knee joint, bilateral: Secondary | ICD-10-CM | POA: Diagnosis not present

## 2017-12-28 DIAGNOSIS — E11622 Type 2 diabetes mellitus with other skin ulcer: Secondary | ICD-10-CM | POA: Diagnosis not present

## 2017-12-28 DIAGNOSIS — L88 Pyoderma gangrenosum: Secondary | ICD-10-CM | POA: Diagnosis not present

## 2017-12-28 DIAGNOSIS — L97219 Non-pressure chronic ulcer of right calf with unspecified severity: Secondary | ICD-10-CM | POA: Diagnosis not present

## 2017-12-28 DIAGNOSIS — Z8673 Personal history of transient ischemic attack (TIA), and cerebral infarction without residual deficits: Secondary | ICD-10-CM | POA: Diagnosis not present

## 2017-12-28 DIAGNOSIS — M109 Gout, unspecified: Secondary | ICD-10-CM | POA: Diagnosis not present

## 2017-12-28 DIAGNOSIS — R6510 Systemic inflammatory response syndrome (SIRS) of non-infectious origin without acute organ dysfunction: Secondary | ICD-10-CM | POA: Diagnosis not present

## 2017-12-28 DIAGNOSIS — E1122 Type 2 diabetes mellitus with diabetic chronic kidney disease: Secondary | ICD-10-CM | POA: Diagnosis not present

## 2017-12-28 DIAGNOSIS — L97212 Non-pressure chronic ulcer of right calf with fat layer exposed: Secondary | ICD-10-CM | POA: Diagnosis not present

## 2017-12-28 DIAGNOSIS — I10 Essential (primary) hypertension: Secondary | ICD-10-CM | POA: Diagnosis not present

## 2017-12-28 DIAGNOSIS — I5023 Acute on chronic systolic (congestive) heart failure: Secondary | ICD-10-CM | POA: Diagnosis not present

## 2017-12-28 DIAGNOSIS — Z7982 Long term (current) use of aspirin: Secondary | ICD-10-CM | POA: Diagnosis not present

## 2017-12-28 DIAGNOSIS — R569 Unspecified convulsions: Secondary | ICD-10-CM | POA: Diagnosis not present

## 2017-12-28 DIAGNOSIS — M0689 Other specified rheumatoid arthritis, multiple sites: Secondary | ICD-10-CM | POA: Diagnosis not present

## 2017-12-28 DIAGNOSIS — L97819 Non-pressure chronic ulcer of other part of right lower leg with unspecified severity: Secondary | ICD-10-CM | POA: Diagnosis not present

## 2017-12-28 DIAGNOSIS — N183 Chronic kidney disease, stage 3 (moderate): Secondary | ICD-10-CM | POA: Diagnosis not present

## 2017-12-28 DIAGNOSIS — Z7952 Long term (current) use of systemic steroids: Secondary | ICD-10-CM | POA: Diagnosis not present

## 2017-12-28 DIAGNOSIS — M1990 Unspecified osteoarthritis, unspecified site: Secondary | ICD-10-CM | POA: Diagnosis not present

## 2017-12-28 DIAGNOSIS — M259 Joint disorder, unspecified: Secondary | ICD-10-CM | POA: Diagnosis not present

## 2017-12-28 DIAGNOSIS — I252 Old myocardial infarction: Secondary | ICD-10-CM | POA: Diagnosis not present

## 2017-12-28 DIAGNOSIS — Z7984 Long term (current) use of oral hypoglycemic drugs: Secondary | ICD-10-CM | POA: Diagnosis not present

## 2017-12-28 DIAGNOSIS — I2510 Atherosclerotic heart disease of native coronary artery without angina pectoris: Secondary | ICD-10-CM | POA: Diagnosis not present

## 2017-12-30 ENCOUNTER — Other Ambulatory Visit: Payer: Self-pay | Admitting: Family Medicine

## 2017-12-30 DIAGNOSIS — I10 Essential (primary) hypertension: Secondary | ICD-10-CM

## 2017-12-31 DIAGNOSIS — Z7982 Long term (current) use of aspirin: Secondary | ICD-10-CM | POA: Diagnosis not present

## 2017-12-31 DIAGNOSIS — E11622 Type 2 diabetes mellitus with other skin ulcer: Secondary | ICD-10-CM | POA: Diagnosis not present

## 2017-12-31 DIAGNOSIS — I2510 Atherosclerotic heart disease of native coronary artery without angina pectoris: Secondary | ICD-10-CM | POA: Diagnosis not present

## 2017-12-31 DIAGNOSIS — M1990 Unspecified osteoarthritis, unspecified site: Secondary | ICD-10-CM | POA: Diagnosis not present

## 2017-12-31 DIAGNOSIS — Z8673 Personal history of transient ischemic attack (TIA), and cerebral infarction without residual deficits: Secondary | ICD-10-CM | POA: Diagnosis not present

## 2017-12-31 DIAGNOSIS — I130 Hypertensive heart and chronic kidney disease with heart failure and stage 1 through stage 4 chronic kidney disease, or unspecified chronic kidney disease: Secondary | ICD-10-CM | POA: Diagnosis not present

## 2017-12-31 DIAGNOSIS — Z7952 Long term (current) use of systemic steroids: Secondary | ICD-10-CM | POA: Diagnosis not present

## 2017-12-31 DIAGNOSIS — M109 Gout, unspecified: Secondary | ICD-10-CM | POA: Diagnosis not present

## 2017-12-31 DIAGNOSIS — R6510 Systemic inflammatory response syndrome (SIRS) of non-infectious origin without acute organ dysfunction: Secondary | ICD-10-CM | POA: Diagnosis not present

## 2017-12-31 DIAGNOSIS — M0689 Other specified rheumatoid arthritis, multiple sites: Secondary | ICD-10-CM | POA: Diagnosis not present

## 2017-12-31 DIAGNOSIS — E1122 Type 2 diabetes mellitus with diabetic chronic kidney disease: Secondary | ICD-10-CM | POA: Diagnosis not present

## 2017-12-31 DIAGNOSIS — R569 Unspecified convulsions: Secondary | ICD-10-CM | POA: Diagnosis not present

## 2017-12-31 DIAGNOSIS — L97219 Non-pressure chronic ulcer of right calf with unspecified severity: Secondary | ICD-10-CM | POA: Diagnosis not present

## 2017-12-31 DIAGNOSIS — Z7984 Long term (current) use of oral hypoglycemic drugs: Secondary | ICD-10-CM | POA: Diagnosis not present

## 2017-12-31 DIAGNOSIS — L97819 Non-pressure chronic ulcer of other part of right lower leg with unspecified severity: Secondary | ICD-10-CM | POA: Diagnosis not present

## 2017-12-31 DIAGNOSIS — N183 Chronic kidney disease, stage 3 (moderate): Secondary | ICD-10-CM | POA: Diagnosis not present

## 2017-12-31 DIAGNOSIS — Z96653 Presence of artificial knee joint, bilateral: Secondary | ICD-10-CM | POA: Diagnosis not present

## 2017-12-31 DIAGNOSIS — I5023 Acute on chronic systolic (congestive) heart failure: Secondary | ICD-10-CM | POA: Diagnosis not present

## 2017-12-31 DIAGNOSIS — I252 Old myocardial infarction: Secondary | ICD-10-CM | POA: Diagnosis not present

## 2018-01-03 DIAGNOSIS — M1990 Unspecified osteoarthritis, unspecified site: Secondary | ICD-10-CM | POA: Diagnosis not present

## 2018-01-03 DIAGNOSIS — E11622 Type 2 diabetes mellitus with other skin ulcer: Secondary | ICD-10-CM | POA: Diagnosis not present

## 2018-01-03 DIAGNOSIS — I130 Hypertensive heart and chronic kidney disease with heart failure and stage 1 through stage 4 chronic kidney disease, or unspecified chronic kidney disease: Secondary | ICD-10-CM | POA: Diagnosis not present

## 2018-01-03 DIAGNOSIS — L97819 Non-pressure chronic ulcer of other part of right lower leg with unspecified severity: Secondary | ICD-10-CM | POA: Diagnosis not present

## 2018-01-03 DIAGNOSIS — I252 Old myocardial infarction: Secondary | ICD-10-CM | POA: Diagnosis not present

## 2018-01-03 DIAGNOSIS — I2510 Atherosclerotic heart disease of native coronary artery without angina pectoris: Secondary | ICD-10-CM | POA: Diagnosis not present

## 2018-01-03 DIAGNOSIS — R6510 Systemic inflammatory response syndrome (SIRS) of non-infectious origin without acute organ dysfunction: Secondary | ICD-10-CM | POA: Diagnosis not present

## 2018-01-03 DIAGNOSIS — E1122 Type 2 diabetes mellitus with diabetic chronic kidney disease: Secondary | ICD-10-CM | POA: Diagnosis not present

## 2018-01-03 DIAGNOSIS — Z8673 Personal history of transient ischemic attack (TIA), and cerebral infarction without residual deficits: Secondary | ICD-10-CM | POA: Diagnosis not present

## 2018-01-03 DIAGNOSIS — Z7984 Long term (current) use of oral hypoglycemic drugs: Secondary | ICD-10-CM | POA: Diagnosis not present

## 2018-01-03 DIAGNOSIS — N183 Chronic kidney disease, stage 3 (moderate): Secondary | ICD-10-CM | POA: Diagnosis not present

## 2018-01-03 DIAGNOSIS — R569 Unspecified convulsions: Secondary | ICD-10-CM | POA: Diagnosis not present

## 2018-01-03 DIAGNOSIS — Z96653 Presence of artificial knee joint, bilateral: Secondary | ICD-10-CM | POA: Diagnosis not present

## 2018-01-03 DIAGNOSIS — M0689 Other specified rheumatoid arthritis, multiple sites: Secondary | ICD-10-CM | POA: Diagnosis not present

## 2018-01-03 DIAGNOSIS — I5023 Acute on chronic systolic (congestive) heart failure: Secondary | ICD-10-CM | POA: Diagnosis not present

## 2018-01-03 DIAGNOSIS — M109 Gout, unspecified: Secondary | ICD-10-CM | POA: Diagnosis not present

## 2018-01-03 DIAGNOSIS — Z7952 Long term (current) use of systemic steroids: Secondary | ICD-10-CM | POA: Diagnosis not present

## 2018-01-03 DIAGNOSIS — L97219 Non-pressure chronic ulcer of right calf with unspecified severity: Secondary | ICD-10-CM | POA: Diagnosis not present

## 2018-01-03 DIAGNOSIS — Z7982 Long term (current) use of aspirin: Secondary | ICD-10-CM | POA: Diagnosis not present

## 2018-01-04 DIAGNOSIS — M1A09X1 Idiopathic chronic gout, multiple sites, with tophus (tophi): Secondary | ICD-10-CM | POA: Diagnosis not present

## 2018-01-04 DIAGNOSIS — Z79899 Other long term (current) drug therapy: Secondary | ICD-10-CM | POA: Diagnosis not present

## 2018-01-04 DIAGNOSIS — Z13820 Encounter for screening for osteoporosis: Secondary | ICD-10-CM | POA: Diagnosis not present

## 2018-01-04 DIAGNOSIS — Z5181 Encounter for therapeutic drug level monitoring: Secondary | ICD-10-CM | POA: Diagnosis not present

## 2018-01-04 DIAGNOSIS — M069 Rheumatoid arthritis, unspecified: Secondary | ICD-10-CM | POA: Diagnosis not present

## 2018-01-05 DIAGNOSIS — Z7952 Long term (current) use of systemic steroids: Secondary | ICD-10-CM | POA: Diagnosis not present

## 2018-01-05 DIAGNOSIS — E1136 Type 2 diabetes mellitus with diabetic cataract: Secondary | ICD-10-CM | POA: Diagnosis not present

## 2018-01-05 DIAGNOSIS — Z7984 Long term (current) use of oral hypoglycemic drugs: Secondary | ICD-10-CM | POA: Diagnosis not present

## 2018-01-05 DIAGNOSIS — E1122 Type 2 diabetes mellitus with diabetic chronic kidney disease: Secondary | ICD-10-CM | POA: Diagnosis not present

## 2018-01-05 DIAGNOSIS — M359 Systemic involvement of connective tissue, unspecified: Secondary | ICD-10-CM | POA: Diagnosis not present

## 2018-01-05 DIAGNOSIS — I129 Hypertensive chronic kidney disease with stage 1 through stage 4 chronic kidney disease, or unspecified chronic kidney disease: Secondary | ICD-10-CM | POA: Diagnosis not present

## 2018-01-05 DIAGNOSIS — M0689 Other specified rheumatoid arthritis, multiple sites: Secondary | ICD-10-CM | POA: Diagnosis not present

## 2018-01-05 DIAGNOSIS — Z8673 Personal history of transient ischemic attack (TIA), and cerebral infarction without residual deficits: Secondary | ICD-10-CM | POA: Diagnosis not present

## 2018-01-05 DIAGNOSIS — Z96653 Presence of artificial knee joint, bilateral: Secondary | ICD-10-CM | POA: Diagnosis not present

## 2018-01-05 DIAGNOSIS — L97212 Non-pressure chronic ulcer of right calf with fat layer exposed: Secondary | ICD-10-CM | POA: Diagnosis not present

## 2018-01-05 DIAGNOSIS — I2510 Atherosclerotic heart disease of native coronary artery without angina pectoris: Secondary | ICD-10-CM | POA: Diagnosis not present

## 2018-01-05 DIAGNOSIS — N183 Chronic kidney disease, stage 3 (moderate): Secondary | ICD-10-CM | POA: Diagnosis not present

## 2018-01-05 DIAGNOSIS — L97812 Non-pressure chronic ulcer of other part of right lower leg with fat layer exposed: Secondary | ICD-10-CM | POA: Diagnosis not present

## 2018-01-05 MED FILL — SANTYL OINTMENT: 250 | 14 days supply | Qty: 30 | Fill #0

## 2018-01-07 DIAGNOSIS — M109 Gout, unspecified: Secondary | ICD-10-CM | POA: Diagnosis not present

## 2018-01-07 DIAGNOSIS — M0689 Other specified rheumatoid arthritis, multiple sites: Secondary | ICD-10-CM | POA: Diagnosis not present

## 2018-01-07 DIAGNOSIS — I2510 Atherosclerotic heart disease of native coronary artery without angina pectoris: Secondary | ICD-10-CM | POA: Diagnosis not present

## 2018-01-07 DIAGNOSIS — N183 Chronic kidney disease, stage 3 (moderate): Secondary | ICD-10-CM | POA: Diagnosis not present

## 2018-01-07 DIAGNOSIS — E1122 Type 2 diabetes mellitus with diabetic chronic kidney disease: Secondary | ICD-10-CM | POA: Diagnosis not present

## 2018-01-07 DIAGNOSIS — I130 Hypertensive heart and chronic kidney disease with heart failure and stage 1 through stage 4 chronic kidney disease, or unspecified chronic kidney disease: Secondary | ICD-10-CM | POA: Diagnosis not present

## 2018-01-07 DIAGNOSIS — R569 Unspecified convulsions: Secondary | ICD-10-CM | POA: Diagnosis not present

## 2018-01-07 DIAGNOSIS — R6510 Systemic inflammatory response syndrome (SIRS) of non-infectious origin without acute organ dysfunction: Secondary | ICD-10-CM | POA: Diagnosis not present

## 2018-01-07 DIAGNOSIS — I5023 Acute on chronic systolic (congestive) heart failure: Secondary | ICD-10-CM | POA: Diagnosis not present

## 2018-01-07 DIAGNOSIS — Z96653 Presence of artificial knee joint, bilateral: Secondary | ICD-10-CM | POA: Diagnosis not present

## 2018-01-07 DIAGNOSIS — L97219 Non-pressure chronic ulcer of right calf with unspecified severity: Secondary | ICD-10-CM | POA: Diagnosis not present

## 2018-01-07 DIAGNOSIS — I252 Old myocardial infarction: Secondary | ICD-10-CM | POA: Diagnosis not present

## 2018-01-07 DIAGNOSIS — Z7984 Long term (current) use of oral hypoglycemic drugs: Secondary | ICD-10-CM | POA: Diagnosis not present

## 2018-01-07 DIAGNOSIS — Z7982 Long term (current) use of aspirin: Secondary | ICD-10-CM | POA: Diagnosis not present

## 2018-01-07 DIAGNOSIS — Z7952 Long term (current) use of systemic steroids: Secondary | ICD-10-CM | POA: Diagnosis not present

## 2018-01-07 DIAGNOSIS — L97819 Non-pressure chronic ulcer of other part of right lower leg with unspecified severity: Secondary | ICD-10-CM | POA: Diagnosis not present

## 2018-01-07 DIAGNOSIS — Z8673 Personal history of transient ischemic attack (TIA), and cerebral infarction without residual deficits: Secondary | ICD-10-CM | POA: Diagnosis not present

## 2018-01-07 DIAGNOSIS — E11622 Type 2 diabetes mellitus with other skin ulcer: Secondary | ICD-10-CM | POA: Diagnosis not present

## 2018-01-07 DIAGNOSIS — M1990 Unspecified osteoarthritis, unspecified site: Secondary | ICD-10-CM | POA: Diagnosis not present

## 2018-01-08 DIAGNOSIS — M1990 Unspecified osteoarthritis, unspecified site: Secondary | ICD-10-CM | POA: Diagnosis not present

## 2018-01-08 DIAGNOSIS — R569 Unspecified convulsions: Secondary | ICD-10-CM | POA: Diagnosis not present

## 2018-01-08 DIAGNOSIS — I2510 Atherosclerotic heart disease of native coronary artery without angina pectoris: Secondary | ICD-10-CM | POA: Diagnosis not present

## 2018-01-08 DIAGNOSIS — Z7984 Long term (current) use of oral hypoglycemic drugs: Secondary | ICD-10-CM | POA: Diagnosis not present

## 2018-01-08 DIAGNOSIS — I130 Hypertensive heart and chronic kidney disease with heart failure and stage 1 through stage 4 chronic kidney disease, or unspecified chronic kidney disease: Secondary | ICD-10-CM | POA: Diagnosis not present

## 2018-01-08 DIAGNOSIS — Z7952 Long term (current) use of systemic steroids: Secondary | ICD-10-CM | POA: Diagnosis not present

## 2018-01-08 DIAGNOSIS — M0689 Other specified rheumatoid arthritis, multiple sites: Secondary | ICD-10-CM | POA: Diagnosis not present

## 2018-01-08 DIAGNOSIS — R6510 Systemic inflammatory response syndrome (SIRS) of non-infectious origin without acute organ dysfunction: Secondary | ICD-10-CM | POA: Diagnosis not present

## 2018-01-08 DIAGNOSIS — L97819 Non-pressure chronic ulcer of other part of right lower leg with unspecified severity: Secondary | ICD-10-CM | POA: Diagnosis not present

## 2018-01-08 DIAGNOSIS — E1122 Type 2 diabetes mellitus with diabetic chronic kidney disease: Secondary | ICD-10-CM | POA: Diagnosis not present

## 2018-01-08 DIAGNOSIS — N183 Chronic kidney disease, stage 3 (moderate): Secondary | ICD-10-CM | POA: Diagnosis not present

## 2018-01-08 DIAGNOSIS — I5023 Acute on chronic systolic (congestive) heart failure: Secondary | ICD-10-CM | POA: Diagnosis not present

## 2018-01-08 DIAGNOSIS — E11622 Type 2 diabetes mellitus with other skin ulcer: Secondary | ICD-10-CM | POA: Diagnosis not present

## 2018-01-08 DIAGNOSIS — M109 Gout, unspecified: Secondary | ICD-10-CM | POA: Diagnosis not present

## 2018-01-08 DIAGNOSIS — L97219 Non-pressure chronic ulcer of right calf with unspecified severity: Secondary | ICD-10-CM | POA: Diagnosis not present

## 2018-01-08 DIAGNOSIS — Z96653 Presence of artificial knee joint, bilateral: Secondary | ICD-10-CM | POA: Diagnosis not present

## 2018-01-08 DIAGNOSIS — Z8673 Personal history of transient ischemic attack (TIA), and cerebral infarction without residual deficits: Secondary | ICD-10-CM | POA: Diagnosis not present

## 2018-01-08 DIAGNOSIS — Z7982 Long term (current) use of aspirin: Secondary | ICD-10-CM | POA: Diagnosis not present

## 2018-01-08 DIAGNOSIS — I252 Old myocardial infarction: Secondary | ICD-10-CM | POA: Diagnosis not present

## 2018-01-11 ENCOUNTER — Other Ambulatory Visit: Payer: Self-pay | Admitting: Family Medicine

## 2018-01-11 NOTE — Telephone Encounter (Signed)
Please advise on refill.

## 2018-01-12 DIAGNOSIS — I2510 Atherosclerotic heart disease of native coronary artery without angina pectoris: Secondary | ICD-10-CM | POA: Diagnosis not present

## 2018-01-12 DIAGNOSIS — Z7952 Long term (current) use of systemic steroids: Secondary | ICD-10-CM | POA: Diagnosis not present

## 2018-01-12 DIAGNOSIS — L97819 Non-pressure chronic ulcer of other part of right lower leg with unspecified severity: Secondary | ICD-10-CM | POA: Diagnosis not present

## 2018-01-12 DIAGNOSIS — R6510 Systemic inflammatory response syndrome (SIRS) of non-infectious origin without acute organ dysfunction: Secondary | ICD-10-CM | POA: Diagnosis not present

## 2018-01-12 DIAGNOSIS — N183 Chronic kidney disease, stage 3 (moderate): Secondary | ICD-10-CM | POA: Diagnosis not present

## 2018-01-12 DIAGNOSIS — I252 Old myocardial infarction: Secondary | ICD-10-CM | POA: Diagnosis not present

## 2018-01-12 DIAGNOSIS — I5023 Acute on chronic systolic (congestive) heart failure: Secondary | ICD-10-CM | POA: Diagnosis not present

## 2018-01-12 DIAGNOSIS — I130 Hypertensive heart and chronic kidney disease with heart failure and stage 1 through stage 4 chronic kidney disease, or unspecified chronic kidney disease: Secondary | ICD-10-CM | POA: Diagnosis not present

## 2018-01-12 DIAGNOSIS — Z96653 Presence of artificial knee joint, bilateral: Secondary | ICD-10-CM | POA: Diagnosis not present

## 2018-01-12 DIAGNOSIS — Z8673 Personal history of transient ischemic attack (TIA), and cerebral infarction without residual deficits: Secondary | ICD-10-CM | POA: Diagnosis not present

## 2018-01-12 DIAGNOSIS — M109 Gout, unspecified: Secondary | ICD-10-CM | POA: Diagnosis not present

## 2018-01-12 DIAGNOSIS — E1122 Type 2 diabetes mellitus with diabetic chronic kidney disease: Secondary | ICD-10-CM | POA: Diagnosis not present

## 2018-01-12 DIAGNOSIS — Z7984 Long term (current) use of oral hypoglycemic drugs: Secondary | ICD-10-CM | POA: Diagnosis not present

## 2018-01-12 DIAGNOSIS — M0689 Other specified rheumatoid arthritis, multiple sites: Secondary | ICD-10-CM | POA: Diagnosis not present

## 2018-01-12 DIAGNOSIS — L97219 Non-pressure chronic ulcer of right calf with unspecified severity: Secondary | ICD-10-CM | POA: Diagnosis not present

## 2018-01-12 DIAGNOSIS — R569 Unspecified convulsions: Secondary | ICD-10-CM | POA: Diagnosis not present

## 2018-01-12 DIAGNOSIS — E11622 Type 2 diabetes mellitus with other skin ulcer: Secondary | ICD-10-CM | POA: Diagnosis not present

## 2018-01-12 DIAGNOSIS — M1990 Unspecified osteoarthritis, unspecified site: Secondary | ICD-10-CM | POA: Diagnosis not present

## 2018-01-12 DIAGNOSIS — Z7982 Long term (current) use of aspirin: Secondary | ICD-10-CM | POA: Diagnosis not present

## 2018-01-13 DIAGNOSIS — Z7982 Long term (current) use of aspirin: Secondary | ICD-10-CM | POA: Diagnosis not present

## 2018-01-13 DIAGNOSIS — Z96653 Presence of artificial knee joint, bilateral: Secondary | ICD-10-CM | POA: Diagnosis not present

## 2018-01-13 DIAGNOSIS — I5023 Acute on chronic systolic (congestive) heart failure: Secondary | ICD-10-CM | POA: Diagnosis not present

## 2018-01-13 DIAGNOSIS — M0689 Other specified rheumatoid arthritis, multiple sites: Secondary | ICD-10-CM | POA: Diagnosis not present

## 2018-01-13 DIAGNOSIS — E1122 Type 2 diabetes mellitus with diabetic chronic kidney disease: Secondary | ICD-10-CM | POA: Diagnosis not present

## 2018-01-13 DIAGNOSIS — E11622 Type 2 diabetes mellitus with other skin ulcer: Secondary | ICD-10-CM | POA: Diagnosis not present

## 2018-01-13 DIAGNOSIS — Z7952 Long term (current) use of systemic steroids: Secondary | ICD-10-CM | POA: Diagnosis not present

## 2018-01-13 DIAGNOSIS — I252 Old myocardial infarction: Secondary | ICD-10-CM | POA: Diagnosis not present

## 2018-01-13 DIAGNOSIS — L97819 Non-pressure chronic ulcer of other part of right lower leg with unspecified severity: Secondary | ICD-10-CM | POA: Diagnosis not present

## 2018-01-13 DIAGNOSIS — I2510 Atherosclerotic heart disease of native coronary artery without angina pectoris: Secondary | ICD-10-CM | POA: Diagnosis not present

## 2018-01-13 DIAGNOSIS — R6510 Systemic inflammatory response syndrome (SIRS) of non-infectious origin without acute organ dysfunction: Secondary | ICD-10-CM | POA: Diagnosis not present

## 2018-01-13 DIAGNOSIS — Z8673 Personal history of transient ischemic attack (TIA), and cerebral infarction without residual deficits: Secondary | ICD-10-CM | POA: Diagnosis not present

## 2018-01-13 DIAGNOSIS — Z7984 Long term (current) use of oral hypoglycemic drugs: Secondary | ICD-10-CM | POA: Diagnosis not present

## 2018-01-13 DIAGNOSIS — M1990 Unspecified osteoarthritis, unspecified site: Secondary | ICD-10-CM | POA: Diagnosis not present

## 2018-01-13 DIAGNOSIS — I130 Hypertensive heart and chronic kidney disease with heart failure and stage 1 through stage 4 chronic kidney disease, or unspecified chronic kidney disease: Secondary | ICD-10-CM | POA: Diagnosis not present

## 2018-01-13 DIAGNOSIS — L97219 Non-pressure chronic ulcer of right calf with unspecified severity: Secondary | ICD-10-CM | POA: Diagnosis not present

## 2018-01-13 DIAGNOSIS — N183 Chronic kidney disease, stage 3 (moderate): Secondary | ICD-10-CM | POA: Diagnosis not present

## 2018-01-13 DIAGNOSIS — R569 Unspecified convulsions: Secondary | ICD-10-CM | POA: Diagnosis not present

## 2018-01-13 DIAGNOSIS — M109 Gout, unspecified: Secondary | ICD-10-CM | POA: Diagnosis not present

## 2018-01-14 ENCOUNTER — Encounter: Payer: Medicare Other | Admitting: Vascular Surgery

## 2018-01-19 ENCOUNTER — Encounter (HOSPITAL_BASED_OUTPATIENT_CLINIC_OR_DEPARTMENT_OTHER): Payer: Medicare Other | Attending: Physician Assistant

## 2018-01-19 DIAGNOSIS — N183 Chronic kidney disease, stage 3 (moderate): Secondary | ICD-10-CM | POA: Diagnosis not present

## 2018-01-19 DIAGNOSIS — L97812 Non-pressure chronic ulcer of other part of right lower leg with fat layer exposed: Secondary | ICD-10-CM | POA: Diagnosis not present

## 2018-01-19 DIAGNOSIS — I130 Hypertensive heart and chronic kidney disease with heart failure and stage 1 through stage 4 chronic kidney disease, or unspecified chronic kidney disease: Secondary | ICD-10-CM | POA: Diagnosis not present

## 2018-01-19 DIAGNOSIS — Z8673 Personal history of transient ischemic attack (TIA), and cerebral infarction without residual deficits: Secondary | ICD-10-CM | POA: Insufficient documentation

## 2018-01-19 DIAGNOSIS — L97212 Non-pressure chronic ulcer of right calf with fat layer exposed: Secondary | ICD-10-CM | POA: Diagnosis not present

## 2018-01-19 DIAGNOSIS — E1122 Type 2 diabetes mellitus with diabetic chronic kidney disease: Secondary | ICD-10-CM | POA: Diagnosis not present

## 2018-01-19 DIAGNOSIS — E11622 Type 2 diabetes mellitus with other skin ulcer: Secondary | ICD-10-CM | POA: Insufficient documentation

## 2018-01-19 DIAGNOSIS — I5033 Acute on chronic diastolic (congestive) heart failure: Secondary | ICD-10-CM | POA: Insufficient documentation

## 2018-01-19 DIAGNOSIS — I110 Hypertensive heart disease with heart failure: Secondary | ICD-10-CM | POA: Diagnosis not present

## 2018-01-19 DIAGNOSIS — M069 Rheumatoid arthritis, unspecified: Secondary | ICD-10-CM | POA: Diagnosis not present

## 2018-01-19 DIAGNOSIS — M359 Systemic involvement of connective tissue, unspecified: Secondary | ICD-10-CM | POA: Diagnosis not present

## 2018-01-21 DIAGNOSIS — I252 Old myocardial infarction: Secondary | ICD-10-CM | POA: Diagnosis not present

## 2018-01-21 DIAGNOSIS — Z7982 Long term (current) use of aspirin: Secondary | ICD-10-CM | POA: Diagnosis not present

## 2018-01-21 DIAGNOSIS — R6510 Systemic inflammatory response syndrome (SIRS) of non-infectious origin without acute organ dysfunction: Secondary | ICD-10-CM | POA: Diagnosis not present

## 2018-01-21 DIAGNOSIS — I130 Hypertensive heart and chronic kidney disease with heart failure and stage 1 through stage 4 chronic kidney disease, or unspecified chronic kidney disease: Secondary | ICD-10-CM | POA: Diagnosis not present

## 2018-01-21 DIAGNOSIS — M109 Gout, unspecified: Secondary | ICD-10-CM | POA: Diagnosis not present

## 2018-01-21 DIAGNOSIS — M0689 Other specified rheumatoid arthritis, multiple sites: Secondary | ICD-10-CM | POA: Diagnosis not present

## 2018-01-21 DIAGNOSIS — Z8673 Personal history of transient ischemic attack (TIA), and cerebral infarction without residual deficits: Secondary | ICD-10-CM | POA: Diagnosis not present

## 2018-01-21 DIAGNOSIS — E11622 Type 2 diabetes mellitus with other skin ulcer: Secondary | ICD-10-CM | POA: Diagnosis not present

## 2018-01-21 DIAGNOSIS — L97819 Non-pressure chronic ulcer of other part of right lower leg with unspecified severity: Secondary | ICD-10-CM | POA: Diagnosis not present

## 2018-01-21 DIAGNOSIS — R569 Unspecified convulsions: Secondary | ICD-10-CM | POA: Diagnosis not present

## 2018-01-21 DIAGNOSIS — I5023 Acute on chronic systolic (congestive) heart failure: Secondary | ICD-10-CM | POA: Diagnosis not present

## 2018-01-21 DIAGNOSIS — E1122 Type 2 diabetes mellitus with diabetic chronic kidney disease: Secondary | ICD-10-CM | POA: Diagnosis not present

## 2018-01-21 DIAGNOSIS — Z96653 Presence of artificial knee joint, bilateral: Secondary | ICD-10-CM | POA: Diagnosis not present

## 2018-01-21 DIAGNOSIS — Z7984 Long term (current) use of oral hypoglycemic drugs: Secondary | ICD-10-CM | POA: Diagnosis not present

## 2018-01-21 DIAGNOSIS — I2510 Atherosclerotic heart disease of native coronary artery without angina pectoris: Secondary | ICD-10-CM | POA: Diagnosis not present

## 2018-01-21 DIAGNOSIS — L97219 Non-pressure chronic ulcer of right calf with unspecified severity: Secondary | ICD-10-CM | POA: Diagnosis not present

## 2018-01-21 DIAGNOSIS — Z7952 Long term (current) use of systemic steroids: Secondary | ICD-10-CM | POA: Diagnosis not present

## 2018-01-21 DIAGNOSIS — N183 Chronic kidney disease, stage 3 (moderate): Secondary | ICD-10-CM | POA: Diagnosis not present

## 2018-01-21 DIAGNOSIS — M1990 Unspecified osteoarthritis, unspecified site: Secondary | ICD-10-CM | POA: Diagnosis not present

## 2018-01-22 ENCOUNTER — Ambulatory Visit (INDEPENDENT_AMBULATORY_CARE_PROVIDER_SITE_OTHER): Payer: Medicare Other | Admitting: Family

## 2018-01-22 ENCOUNTER — Encounter: Payer: Self-pay | Admitting: Family

## 2018-01-22 VITALS — BP 134/84 | HR 87 | Temp 98.0°F | Resp 14 | Ht 64.0 in | Wt 191.0 lb

## 2018-01-22 DIAGNOSIS — H109 Unspecified conjunctivitis: Secondary | ICD-10-CM | POA: Diagnosis not present

## 2018-01-22 MED ORDER — ERYTHROMYCIN 5 MG/GM OP OINT: TOPICAL_OINTMENT | OPHTHALMIC | 0 refills | Status: DC

## 2018-01-22 NOTE — Progress Notes (Signed)
Subjective:    Patient ID: [name] [name], female    DOB: 02/09/1930, 84 y.o.   MRN: [id number]  CC: [name] [name] is a 84 y.o. female who presents today for an acute visit.    HPI: Chief complaint of eye drainage, redness, 3 days, unchanged.  Notes right eye is stuck shut, and has to used wash cloth No eye pain, vision changes, sinus pressure ear pain, nasal congestion, cough.  Doesn't wear contacts.     HISTORY:  Past Medical History:  Diagnosis Date  . Allergic rhinitis due to pollen 04/27/2007  . Arthritis    "in q joint" (08/07/2013)  . Coronary atherosclerosis of native coronary artery   . Cough   . Disorder of bone and cartilage, unspecified   . Edema 05/03/2013  . Exertional shortness of breath    "sometimes" (08/07/2013)  . First degree atrioventricular block   . Gout, unspecified 04/19/2013  . Hyperlipidemia 04/27/2007  . Hypertension   . Muscle spasm   . Muscle weakness (generalized)   . Myocardial infarction ([place]) ~ 1970  . Onychia and paronychia of toe   . Osteoarthrosis, unspecified whether generalized or localized, unspecified site 04/27/2007  . Other fall   . Other malaise and fatigue   . Prepatellar bursitis   . Seizures ([place])   . Stroke ([place]) 01/17/2014  . TIA (transient ischemic attack)    "a few one summer" (08/07/2013)  . Type II diabetes mellitus ([place])    "fasting 90-110s" (08/07/2013)  . Unspecified essential hypertension 08/08/2013  . Unspecified vitamin D deficiency    Past Surgical History:  Procedure Laterality Date  . ABDOMINAL HYSTERECTOMY  04/1980  . APPENDECTOMY  1960  . CARDIAC CATHETERIZATION  2003  . CATARACT EXTRACTION W/ INTRAOCULAR LENS  IMPLANT, BILATERAL Bilateral ~ 2012  . JOINT REPLACEMENT    . REPLACEMENT TOTAL KNEE Right 09/2005  . SHOULDER OPEN ROTATOR CUFF REPAIR Right 07/1999  . TONSILLECTOMY  08/1974  . TOTAL KNEE ARTHROPLASTY Left 08/07/2013   Procedure: TOTAL KNEE ARTHROPLASTY- LEFT;  Surgeon: Vickey Huger, MD;   Location: [facility];  Service: Orthopedics;  Laterality: Left;  . TRANSTHORACIC ECHOCARDIOGRAM  2003   EF 55-65%; mild concentric LVH   Family History  Problem Relation Age of Onset  . Heart attack Father     Allergies: Clonidine derivatives; Fish allergy; Shellfish allergy; Doxycycline; Indomethacin; Lyrica [pregabalin]; Methyldopa; Orange fruit [citrus]; Cetirizine hcl; Codeine; Levaquin [levofloxacin in d5w]; and Tomato Current Outpatient Medications on File Prior to Visit  Medication Sig Dispense Refill  . albuterol (PROVENTIL HFA;VENTOLIN HFA) 108 (90 Base) MCG/ACT inhaler Inhale 1 puff into the lungs every 6 (six) hours as needed for wheezing or shortness of breath.    Marland Kitchen aspirin EC 81 MG tablet Take 81 mg by mouth daily.    . blood glucose meter kit and supplies KIT Dispense based on patient and insurance preference. Use up to four times daily as directed. (FOR ICD-9 250.00, 250.01). 1 each 0  . calcium-vitamin D (OSCAL WITH D) 500-200 MG-UNIT per tablet Take 1 tablet by mouth daily.     . CHLOROPHYLL PO Take 5 drops by mouth See admin instructions. MIX INTO 6 OUNCES OF JUICE AND DRINK ONCE A DAY    . ferrous sulfate 325 (65 FE) MG tablet Take 325 mg by mouth daily.     . Fluticasone-Salmeterol (ADVAIR DISKUS) 250-50 MCG/DOSE AEPB Inhale 1 puff into the lungs 2 (two) times daily.    . furosemide (  LASIX) 20 MG tablet Take 1 tablet (20 mg total) by mouth every other day. 30 tablet 5  . gabapentin (NEURONTIN) 100 MG capsule Take 1 capsule (100 mg total) at bedtime by mouth. 30 capsule 3  . glucose blood test strip Test QID dx code E11.9 100 each 6  . hydrALAZINE (APRESOLINE) 100 MG tablet take 1 tablet by mouth three times a day 90 tablet 1  . hydroxypropyl methylcellulose / hypromellose (ISOPTO TEARS / GONIOVISC) 2.5 % ophthalmic solution Place 1 drop into both eyes 4 (four) times daily as needed for dry eyes. 15 mL 2  . ketoconazole (NIZORAL) 2 % cream Apply 1 application topically 2 (two)  times daily. 30 g 0  . labetalol (NORMODYNE) 100 MG tablet take 2 tablets by mouth twice a day 120 tablet 1  . metFORMIN (GLUCOPHAGE) 500 MG tablet take 1 tablet by mouth once daily for diabetes (Patient taking differently: Take 500 mg by mouth daily with breakfast. ) 90 tablet 1  . methotrexate (RHEUMATREX) 2.5 MG tablet Take 10 mg by mouth once a week.     . Multiple Vitamins-Minerals (ALIVE ONCE DAILY WOMENS PO) Take 15 mLs by mouth every other day.     Marland Kitchen oxycodone (OXY-IR) 5 MG capsule Take 1 capsule (5 mg total) by mouth every 4 (four) hours as needed for pain. 30 capsule 0  . predniSONE (DELTASONE) 10 MG tablet Take 1 tablet (10 mg total) by mouth daily with breakfast. 14 tablet 0  . SANTYL ointment Apply 1 application topically daily. AS DIRECTED  0  . traMADol (ULTRAM) 50 MG tablet Take 1-2 tablets every 6 hours as needed for pain 240 tablet 5  . triamcinolone ointment (KENALOG) 0.5 % Apply 1 application 2 (two) times daily topically. 15 g 3  . vitamin C (ASCORBIC ACID) 500 MG tablet Take 500 mg by mouth every other day.     . allopurinol (ZYLOPRIM) 300 MG tablet Take 0.5 tablets (150 mg total) by mouth every evening. 15 tablet 0   No current facility-administered medications on file prior to visit.     Social History   Tobacco Use  . Smoking status: Never Smoker  . Smokeless tobacco: Never Used  Substance Use Topics  . Alcohol use: No    Alcohol/week: 0.0 oz  . Drug use: No    Review of Systems  Constitutional: Negative for chills and fever.  HENT: Negative for congestion, sinus pain and sore throat.   Eyes: Positive for discharge and redness. Negative for pain, itching and visual disturbance.  Respiratory: Negative for cough.   Cardiovascular: Negative for chest pain and palpitations.  Gastrointestinal: Negative for nausea and vomiting.      Objective:    BP 134/84 (BP Location: Left Arm, Patient Position: Sitting, Cuff Size: Large)   Pulse 87   Temp 98 F (36.7 C)  (Oral)   Resp 14   Ht 5' 4"  (1.626 m)   Wt 191 lb (86.6 kg)   SpO2 99%   BMI 32.79 kg/m    Physical Exam  Constitutional: She appears well-developed and well-nourished.  HENT:  Head: Normocephalic and atraumatic.  Right Ear: Hearing, tympanic membrane, external ear and ear canal normal. No drainage, swelling or tenderness. No foreign bodies. Tympanic membrane is not erythematous and not bulging. No middle ear effusion. No decreased hearing is noted.  Left Ear: Hearing, tympanic membrane, external ear and ear canal normal. No drainage, swelling or tenderness. No foreign bodies. Tympanic membrane is not  erythematous and not bulging.  No middle ear effusion. No decreased hearing is noted.  Nose: No rhinorrhea. Right sinus exhibits no maxillary sinus tenderness and no frontal sinus tenderness. Left sinus exhibits no maxillary sinus tenderness and no frontal sinus tenderness.  Mouth/Throat: Uvula is midline, oropharynx is clear and moist and mucous membranes are normal. No oropharyngeal exudate, posterior oropharyngeal edema, posterior oropharyngeal erythema or tonsillar abscesses.  Eyes: EOM are normal. Pupils are equal, round, and reactive to light. Lids are everted and swept, no foreign bodies found. Right eye exhibits no discharge. Left eye exhibits no discharge and no hordeolum. Right conjunctiva is injected. Right conjunctiva has no hemorrhage. Left conjunctiva is not injected. Left conjunctiva has no hemorrhage. No scleral icterus.  No external eye lesions. Surrounding skin intact.   Right eye:   Diffuse injection of the conjunctiva. No white spots, opacity, or foreign body appreciated. No collection of blood or pus in the anterior chamber. No ciliary flush surrounding iris.   No photophobia or eye pain appreciated during exam.   Cardiovascular: Regular rhythm, normal heart sounds and normal pulses.  Pulmonary/Chest: Effort normal and breath sounds normal. She has no wheezes. She has no  rhonchi. She has no rales.  Lymphadenopathy:       Head (right side): No submental, no submandibular, no tonsillar, no preauricular, no posterior auricular and no occipital adenopathy present.       Head (left side): No submental, no submandibular, no tonsillar, no preauricular, no posterior auricular and no occipital adenopathy present.    She has no cervical adenopathy.  Neurological: She is alert.  Skin: Skin is warm and dry.  Psychiatric: She has a normal mood and affect. Her speech is normal and behavior is normal. Thought content normal.  Vitals reviewed.      Assessment & Plan:   1. Bacterial conjunctivitis of right eye Patient is well-appearing. symptoms and exam consistent with bacterial conjunctivitis, we will start antibiotic ointment.  Patient will let me know if not better. - erythromycin ophthalmic ointment; Use one half inch four times daily to affected eye (s) x 7 days.  Dispense: 3.5 g; Refill: 0    I am having Ke[place]ha C. Kasson maintain her calcium-vitamin D, vitamin C, aspirin EC, ferrous sulfate, albuterol, ketoconazole, metFORMIN, Multiple Vitamins-Minerals (ALIVE ONCE DAILY WOMENS PO), CHLOROPHYLL PO, furosemide, hydroxypropyl methylcellulose / hypromellose, blood glucose meter kit and supplies, gabapentin, triamcinolone ointment, SANTYL, Fluticasone-Salmeterol, methotrexate, allopurinol, oxycodone, traMADol, predniSONE, glucose blood, hydrALAZINE, and labetalol.   No orders of the defined types were placed in this encounter.   Return precautions given.   Risks, benefits, and alternatives of the medications and treatment plan prescribed today were discussed, and patient expressed understanding.   Education regarding symptom management and diagnosis given to patient on AVS.  Continue to follow with Briscoe Deutscher, DO for routine health maintenance.   Precious Haws and I agreed with plan.   Mable Paris, FNP

## 2018-01-22 NOTE — Progress Notes (Signed)
Pre visit review using our clinic review tool, if applicable. No additional management support is needed unless otherwise documented below in the visit note. 

## 2018-01-22 NOTE — Patient Instructions (Signed)
As discussed, suspect bacterial conjunctivitis also known as "pinkeye".  Please start antibiotic ointment in the right eye; as we discussed it may also spread to the left eye in that case she can use ointment in the left eye as well. Please let me know if you are not better.   Good handwashing is important to prevent transmission.

## 2018-01-26 DIAGNOSIS — Z7952 Long term (current) use of systemic steroids: Secondary | ICD-10-CM | POA: Diagnosis not present

## 2018-01-26 DIAGNOSIS — I252 Old myocardial infarction: Secondary | ICD-10-CM | POA: Diagnosis not present

## 2018-01-26 DIAGNOSIS — Z8673 Personal history of transient ischemic attack (TIA), and cerebral infarction without residual deficits: Secondary | ICD-10-CM | POA: Diagnosis not present

## 2018-01-26 DIAGNOSIS — Z7982 Long term (current) use of aspirin: Secondary | ICD-10-CM | POA: Diagnosis not present

## 2018-01-26 DIAGNOSIS — M259 Joint disorder, unspecified: Secondary | ICD-10-CM | POA: Diagnosis not present

## 2018-01-26 DIAGNOSIS — R6510 Systemic inflammatory response syndrome (SIRS) of non-infectious origin without acute organ dysfunction: Secondary | ICD-10-CM | POA: Diagnosis not present

## 2018-01-26 DIAGNOSIS — I2510 Atherosclerotic heart disease of native coronary artery without angina pectoris: Secondary | ICD-10-CM | POA: Diagnosis not present

## 2018-01-26 DIAGNOSIS — Z7984 Long term (current) use of oral hypoglycemic drugs: Secondary | ICD-10-CM | POA: Diagnosis not present

## 2018-01-26 DIAGNOSIS — R569 Unspecified convulsions: Secondary | ICD-10-CM | POA: Diagnosis not present

## 2018-01-26 DIAGNOSIS — M0689 Other specified rheumatoid arthritis, multiple sites: Secondary | ICD-10-CM | POA: Diagnosis not present

## 2018-01-26 DIAGNOSIS — L97819 Non-pressure chronic ulcer of other part of right lower leg with unspecified severity: Secondary | ICD-10-CM | POA: Diagnosis not present

## 2018-01-26 DIAGNOSIS — I10 Essential (primary) hypertension: Secondary | ICD-10-CM | POA: Diagnosis not present

## 2018-01-26 DIAGNOSIS — L97212 Non-pressure chronic ulcer of right calf with fat layer exposed: Secondary | ICD-10-CM | POA: Diagnosis not present

## 2018-01-26 DIAGNOSIS — I130 Hypertensive heart and chronic kidney disease with heart failure and stage 1 through stage 4 chronic kidney disease, or unspecified chronic kidney disease: Secondary | ICD-10-CM | POA: Diagnosis not present

## 2018-01-26 DIAGNOSIS — E11622 Type 2 diabetes mellitus with other skin ulcer: Secondary | ICD-10-CM | POA: Diagnosis not present

## 2018-01-26 DIAGNOSIS — M109 Gout, unspecified: Secondary | ICD-10-CM | POA: Diagnosis not present

## 2018-01-26 DIAGNOSIS — L97219 Non-pressure chronic ulcer of right calf with unspecified severity: Secondary | ICD-10-CM | POA: Diagnosis not present

## 2018-01-26 DIAGNOSIS — L88 Pyoderma gangrenosum: Secondary | ICD-10-CM | POA: Diagnosis not present

## 2018-01-26 DIAGNOSIS — I5023 Acute on chronic systolic (congestive) heart failure: Secondary | ICD-10-CM | POA: Diagnosis not present

## 2018-01-26 DIAGNOSIS — M1990 Unspecified osteoarthritis, unspecified site: Secondary | ICD-10-CM | POA: Diagnosis not present

## 2018-01-26 DIAGNOSIS — E1122 Type 2 diabetes mellitus with diabetic chronic kidney disease: Secondary | ICD-10-CM | POA: Diagnosis not present

## 2018-01-26 DIAGNOSIS — N183 Chronic kidney disease, stage 3 (moderate): Secondary | ICD-10-CM | POA: Diagnosis not present

## 2018-01-26 DIAGNOSIS — Z96653 Presence of artificial knee joint, bilateral: Secondary | ICD-10-CM | POA: Diagnosis not present

## 2018-01-28 ENCOUNTER — Other Ambulatory Visit: Payer: Self-pay | Admitting: Family Medicine

## 2018-01-28 DIAGNOSIS — N183 Type 2 diabetes mellitus with diabetic chronic kidney disease: Secondary | ICD-10-CM

## 2018-01-28 DIAGNOSIS — E1122 Type 2 diabetes mellitus with diabetic chronic kidney disease: Secondary | ICD-10-CM

## 2018-02-02 ENCOUNTER — Other Ambulatory Visit: Payer: Self-pay

## 2018-02-02 ENCOUNTER — Emergency Department (HOSPITAL_COMMUNITY): Payer: Medicare Other

## 2018-02-02 ENCOUNTER — Encounter (HOSPITAL_COMMUNITY): Payer: Self-pay | Admitting: Emergency Medicine

## 2018-02-02 ENCOUNTER — Inpatient Hospital Stay (HOSPITAL_COMMUNITY)
Admission: EM | Admit: 2018-02-02 | Discharge: 2018-02-04 | DRG: 866 | Disposition: A | Discharge status: Home Health | Payer: Medicare Other | Attending: Internal Medicine | Admitting: Internal Medicine

## 2018-02-02 DIAGNOSIS — M069 Rheumatoid arthritis, unspecified: Secondary | ICD-10-CM | POA: Diagnosis not present

## 2018-02-02 DIAGNOSIS — E1122 Type 2 diabetes mellitus with diabetic chronic kidney disease: Secondary | ICD-10-CM | POA: Diagnosis present

## 2018-02-02 DIAGNOSIS — I130 Hypertensive heart and chronic kidney disease with heart failure and stage 1 through stage 4 chronic kidney disease, or unspecified chronic kidney disease: Secondary | ICD-10-CM | POA: Diagnosis not present

## 2018-02-02 DIAGNOSIS — I10 Essential (primary) hypertension: Secondary | ICD-10-CM | POA: Diagnosis not present

## 2018-02-02 DIAGNOSIS — R6510 Systemic inflammatory response syndrome (SIRS) of non-infectious origin without acute organ dysfunction: Secondary | ICD-10-CM | POA: Diagnosis not present

## 2018-02-02 DIAGNOSIS — Z91018 Allergy to other foods: Secondary | ICD-10-CM

## 2018-02-02 DIAGNOSIS — E611 Iron deficiency: Secondary | ICD-10-CM | POA: Diagnosis present

## 2018-02-02 DIAGNOSIS — I5032 Chronic diastolic (congestive) heart failure: Secondary | ICD-10-CM | POA: Diagnosis present

## 2018-02-02 DIAGNOSIS — N183 Chronic kidney disease, stage 3 (moderate): Secondary | ICD-10-CM | POA: Diagnosis not present

## 2018-02-02 DIAGNOSIS — E1121 Type 2 diabetes mellitus with diabetic nephropathy: Secondary | ICD-10-CM | POA: Diagnosis present

## 2018-02-02 DIAGNOSIS — Z8673 Personal history of transient ischemic attack (TIA), and cerebral infarction without residual deficits: Secondary | ICD-10-CM | POA: Diagnosis not present

## 2018-02-02 DIAGNOSIS — Z96653 Presence of artificial knee joint, bilateral: Secondary | ICD-10-CM | POA: Diagnosis not present

## 2018-02-02 DIAGNOSIS — L97819 Non-pressure chronic ulcer of other part of right lower leg with unspecified severity: Secondary | ICD-10-CM | POA: Diagnosis not present

## 2018-02-02 DIAGNOSIS — Z7982 Long term (current) use of aspirin: Secondary | ICD-10-CM

## 2018-02-02 DIAGNOSIS — E1129 Type 2 diabetes mellitus with other diabetic kidney complication: Secondary | ICD-10-CM | POA: Diagnosis not present

## 2018-02-02 DIAGNOSIS — R197 Diarrhea, unspecified: Secondary | ICD-10-CM | POA: Diagnosis not present

## 2018-02-02 DIAGNOSIS — Z7951 Long term (current) use of inhaled steroids: Secondary | ICD-10-CM | POA: Diagnosis not present

## 2018-02-02 DIAGNOSIS — Z881 Allergy status to other antibiotic agents status: Secondary | ICD-10-CM

## 2018-02-02 DIAGNOSIS — R509 Fever, unspecified: Secondary | ICD-10-CM | POA: Diagnosis not present

## 2018-02-02 DIAGNOSIS — E11622 Type 2 diabetes mellitus with other skin ulcer: Secondary | ICD-10-CM | POA: Diagnosis present

## 2018-02-02 DIAGNOSIS — J45909 Unspecified asthma, uncomplicated: Secondary | ICD-10-CM | POA: Diagnosis present

## 2018-02-02 DIAGNOSIS — R531 Weakness: Secondary | ICD-10-CM | POA: Diagnosis not present

## 2018-02-02 DIAGNOSIS — Z7952 Long term (current) use of systemic steroids: Secondary | ICD-10-CM | POA: Diagnosis not present

## 2018-02-02 DIAGNOSIS — R000 Tachycardia, unspecified: Secondary | ICD-10-CM | POA: Diagnosis not present

## 2018-02-02 DIAGNOSIS — B349 Viral infection, unspecified: Principal | ICD-10-CM | POA: Diagnosis present

## 2018-02-02 DIAGNOSIS — E669 Obesity, unspecified: Secondary | ICD-10-CM | POA: Diagnosis present

## 2018-02-02 DIAGNOSIS — Z885 Allergy status to narcotic agent status: Secondary | ICD-10-CM

## 2018-02-02 DIAGNOSIS — I252 Old myocardial infarction: Secondary | ICD-10-CM

## 2018-02-02 DIAGNOSIS — E876 Hypokalemia: Secondary | ICD-10-CM | POA: Diagnosis not present

## 2018-02-02 DIAGNOSIS — Z7984 Long term (current) use of oral hypoglycemic drugs: Secondary | ICD-10-CM

## 2018-02-02 DIAGNOSIS — Z888 Allergy status to other drugs, medicaments and biological substances status: Secondary | ICD-10-CM

## 2018-02-02 DIAGNOSIS — M0609 Rheumatoid arthritis without rheumatoid factor, multiple sites: Secondary | ICD-10-CM

## 2018-02-02 DIAGNOSIS — J4520 Mild intermittent asthma, uncomplicated: Secondary | ICD-10-CM | POA: Diagnosis not present

## 2018-02-02 DIAGNOSIS — D631 Anemia in chronic kidney disease: Secondary | ICD-10-CM | POA: Diagnosis not present

## 2018-02-02 DIAGNOSIS — M109 Gout, unspecified: Secondary | ICD-10-CM | POA: Diagnosis not present

## 2018-02-02 DIAGNOSIS — Z79899 Other long term (current) drug therapy: Secondary | ICD-10-CM

## 2018-02-02 DIAGNOSIS — Z91013 Allergy to seafood: Secondary | ICD-10-CM

## 2018-02-02 DIAGNOSIS — M1A09X0 Idiopathic chronic gout, multiple sites, without tophus (tophi): Secondary | ICD-10-CM

## 2018-02-02 DIAGNOSIS — E1159 Type 2 diabetes mellitus with other circulatory complications: Secondary | ICD-10-CM | POA: Diagnosis present

## 2018-02-02 DIAGNOSIS — Z6833 Body mass index (BMI) 33.0-33.9, adult: Secondary | ICD-10-CM

## 2018-02-02 DIAGNOSIS — M05712 Rheumatoid arthritis with rheumatoid factor of left shoulder without organ or systems involvement: Secondary | ICD-10-CM | POA: Diagnosis not present

## 2018-02-02 DIAGNOSIS — Z66 Do not resuscitate: Secondary | ICD-10-CM | POA: Diagnosis not present

## 2018-02-02 DIAGNOSIS — Z886 Allergy status to analgesic agent status: Secondary | ICD-10-CM

## 2018-02-02 DIAGNOSIS — M05711 Rheumatoid arthritis with rheumatoid factor of right shoulder without organ or systems involvement: Secondary | ICD-10-CM | POA: Diagnosis not present

## 2018-02-02 DIAGNOSIS — I152 Hypertension secondary to endocrine disorders: Secondary | ICD-10-CM | POA: Diagnosis present

## 2018-02-02 LAB — CBC WITH DIFFERENTIAL/PLATELET
Basophils Absolute: 0 10*3/uL (ref 0.0–0.1)
Basophils Relative: 0 %
Eosinophils Absolute: 0.1 10*3/uL (ref 0.0–0.7)
Eosinophils Relative: 1 %
HCT: 35.2 % — ABNORMAL LOW (ref 36.0–46.0)
Hemoglobin: 11.4 g/dL — ABNORMAL LOW (ref 12.0–15.0)
Lymphocytes Relative: 15 %
Lymphs Abs: 1.3 10*3/uL (ref 0.7–4.0)
MCH: 32 pg (ref 26.0–34.0)
MCHC: 32.4 g/dL (ref 30.0–36.0)
MCV: 98.9 fL (ref 78.0–100.0)
Monocytes Absolute: 0.7 10*3/uL (ref 0.1–1.0)
Monocytes Relative: 8 %
Neutro Abs: 6.6 10*3/uL (ref 1.7–7.7)
Neutrophils Relative %: 76 %
Platelets: 258 10*3/uL (ref 150–400)
RBC: 3.56 MIL/uL — ABNORMAL LOW (ref 3.87–5.11)
RDW: 17.5 % — ABNORMAL HIGH (ref 11.5–15.5)
WBC: 8.8 10*3/uL (ref 4.0–10.5)

## 2018-02-02 LAB — RESPIRATORY PANEL BY PCR

## 2018-02-02 LAB — URINALYSIS, ROUTINE W REFLEX MICROSCOPIC
Bilirubin Urine: NEGATIVE
Glucose, UA: NEGATIVE mg/dL
Hgb urine dipstick: NEGATIVE
Ketones, ur: NEGATIVE mg/dL
Leukocytes, UA: NEGATIVE
Nitrite: NEGATIVE
Protein, ur: NEGATIVE mg/dL
Specific Gravity, Urine: 1.008 (ref 1.005–1.030)
pH: 5 (ref 5.0–8.0)

## 2018-02-02 LAB — COMPREHENSIVE METABOLIC PANEL
ALT: 11 U/L — ABNORMAL LOW (ref 14–54)
AST: 17 U/L (ref 15–41)
Albumin: 3.3 g/dL — ABNORMAL LOW (ref 3.5–5.0)
Alkaline Phosphatase: 44 U/L (ref 38–126)
Anion gap: 10 (ref 5–15)
BUN: 25 mg/dL — ABNORMAL HIGH (ref 6–20)
CO2: 24 mmol/L (ref 22–32)
Calcium: 8.7 mg/dL — ABNORMAL LOW (ref 8.9–10.3)
Chloride: 100 mmol/L — ABNORMAL LOW (ref 101–111)
Creatinine, Ser: 1 mg/dL (ref 0.44–1.00)
GFR calc Af Amer: 58 mL/min — ABNORMAL LOW (ref >60)
GFR calc non Af Amer: 50 mL/min — ABNORMAL LOW (ref >60)
Glucose, Bld: 90 mg/dL (ref 65–99)
Potassium: 3.2 mmol/L — ABNORMAL LOW (ref 3.5–5.1)
Sodium: 134 mmol/L — ABNORMAL LOW (ref 135–145)
Total Bilirubin: 0.4 mg/dL (ref 0.3–1.2)
Total Protein: 6 g/dL — ABNORMAL LOW (ref 6.5–8.1)

## 2018-02-02 LAB — GLUCOSE, CAPILLARY
Glucose-Capillary: 100 mg/dL — ABNORMAL HIGH (ref 65–99)
Glucose-Capillary: 105 mg/dL — ABNORMAL HIGH (ref 65–99)

## 2018-02-02 LAB — INFLUENZA PANEL BY PCR (TYPE A & B)
Influenza A By PCR: NEGATIVE
Influenza B By PCR: NEGATIVE

## 2018-02-02 LAB — CBG MONITORING, ED
Glucose-Capillary: 76 mg/dL (ref 65–99)
Glucose-Capillary: 96 mg/dL (ref 65–99)
Glucose-Capillary: 96 mg/dL (ref 65–99)

## 2018-02-02 LAB — C DIFFICILE QUICK SCREEN W PCR REFLEX
C Diff antigen: NEGATIVE
C Diff interpretation: NOT DETECTED
C Diff toxin: NEGATIVE

## 2018-02-02 LAB — PROCALCITONIN: Procalcitonin: 0.13 ng/mL

## 2018-02-02 LAB — I-STAT CG4 LACTIC ACID, ED: Lactic Acid, Venous: 1.21 mmol/L (ref 0.5–1.9)

## 2018-02-02 LAB — C-REACTIVE PROTEIN: CRP: 6.4 mg/dL — ABNORMAL HIGH (ref <1.0)

## 2018-02-02 LAB — URIC ACID: Uric Acid, Serum: 6.1 mg/dL (ref 2.3–6.6)

## 2018-02-02 LAB — SEDIMENTATION RATE: Sed Rate: 10 mm/hr (ref 0–22)

## 2018-02-02 MED ORDER — PREDNISONE 10 MG PO TABS
10.0000 mg | ORAL_TABLET | Freq: Every day | ORAL | Status: DC
  Administered 2018-02-02 – 2018-02-04 (×3): 10 mg via ORAL
  Filled 2018-02-02 (×3): qty 1

## 2018-02-02 MED ORDER — ACETAMINOPHEN 325 MG PO TABS
650.0000 mg | ORAL_TABLET | Freq: Once | ORAL | Status: AC
  Administered 2018-02-02: 650 mg via ORAL
  Filled 2018-02-02: qty 2

## 2018-02-02 MED ORDER — PIPERACILLIN-TAZOBACTAM 3.375 G IVPB 30 MIN
3.3750 g | Freq: Once | INTRAVENOUS | Status: AC
  Administered 2018-02-02: 3.375 g via INTRAVENOUS
  Filled 2018-02-02: qty 50

## 2018-02-02 MED ORDER — ONDANSETRON HCL 4 MG/2ML IJ SOLN: 4.0000 mg | Freq: Four times a day (QID) | INTRAMUSCULAR | Status: DC | PRN

## 2018-02-02 MED ORDER — COLLAGENASE 250 UNIT/GM EX OINT
TOPICAL_OINTMENT | Freq: Every day | CUTANEOUS | Status: DC
  Administered 2018-02-03 – 2018-02-04 (×2): via TOPICAL
  Filled 2018-02-02: qty 90

## 2018-02-02 MED ORDER — GABAPENTIN 100 MG PO CAPS
100.0000 mg | ORAL_CAPSULE | Freq: Every day | ORAL | Status: DC
  Administered 2018-02-02 – 2018-02-03 (×2): 100 mg via ORAL
  Filled 2018-02-02 (×2): qty 1

## 2018-02-02 MED ORDER — METHOTREXATE 2.5 MG PO TABS: 10.0000 mg | ORAL_TABLET | ORAL | Status: DC

## 2018-02-02 MED ORDER — FUROSEMIDE 20 MG PO TABS
20.0000 mg | ORAL_TABLET | ORAL | Status: DC
  Administered 2018-02-02: 20 mg via ORAL
  Filled 2018-02-02: qty 1

## 2018-02-02 MED ORDER — HYDRALAZINE HCL 50 MG PO TABS
100.0000 mg | ORAL_TABLET | Freq: Three times a day (TID) | ORAL | Status: DC
  Administered 2018-02-02 – 2018-02-04 (×7): 100 mg via ORAL
  Filled 2018-02-02 (×9): qty 2

## 2018-02-02 MED ORDER — LORATADINE 10 MG PO TABS
10.0000 mg | ORAL_TABLET | Freq: Every day | ORAL | Status: DC | PRN
  Administered 2018-02-02 – 2018-02-03 (×2): 10 mg via ORAL
  Filled 2018-02-02 (×2): qty 1

## 2018-02-02 MED ORDER — VANCOMYCIN HCL IN DEXTROSE 1-5 GM/200ML-% IV SOLN
1000.0000 mg | Freq: Once | INTRAVENOUS | Status: AC
  Administered 2018-02-02: 1000 mg via INTRAVENOUS
  Filled 2018-02-02: qty 200

## 2018-02-02 MED ORDER — ACETAMINOPHEN 325 MG PO TABS
650.0000 mg | ORAL_TABLET | Freq: Four times a day (QID) | ORAL | Status: DC | PRN
  Administered 2018-02-03: 650 mg via ORAL
  Filled 2018-02-02: qty 2

## 2018-02-02 MED ORDER — SODIUM CHLORIDE 0.9 % IV BOLUS (SEPSIS)
1000.0000 mL | Freq: Once | INTRAVENOUS | Status: AC
  Administered 2018-02-02: 1000 mL via INTRAVENOUS

## 2018-02-02 MED ORDER — PIPERACILLIN-TAZOBACTAM 3.375 G IVPB
3.3750 g | Freq: Three times a day (TID) | INTRAVENOUS | Status: DC
  Administered 2018-02-02: 3.375 g via INTRAVENOUS
  Filled 2018-02-02: qty 50

## 2018-02-02 MED ORDER — FERROUS SULFATE 325 (65 FE) MG PO TABS
325.0000 mg | ORAL_TABLET | Freq: Every day | ORAL | Status: DC
  Administered 2018-02-02 – 2018-02-04 (×3): 325 mg via ORAL
  Filled 2018-02-02 (×3): qty 1

## 2018-02-02 MED ORDER — HYDRALAZINE HCL 20 MG/ML IJ SOLN
10.0000 mg | INTRAMUSCULAR | Status: DC | PRN
  Administered 2018-02-04: 10 mg via INTRAVENOUS
  Filled 2018-02-02: qty 1

## 2018-02-02 MED ORDER — POLYVINYL ALCOHOL 1.4 % OP SOLN
1.0000 [drp] | Freq: Four times a day (QID) | OPHTHALMIC | Status: DC | PRN
  Filled 2018-02-02: qty 15

## 2018-02-02 MED ORDER — IPRATROPIUM-ALBUTEROL 0.5-2.5 (3) MG/3ML IN SOLN: 3.0000 mL | RESPIRATORY_TRACT | Status: DC | PRN

## 2018-02-02 MED ORDER — ENOXAPARIN SODIUM 40 MG/0.4ML ~~LOC~~ SOLN
40.0000 mg | SUBCUTANEOUS | Status: DC
  Administered 2018-02-02 – 2018-02-04 (×3): 40 mg via SUBCUTANEOUS
  Filled 2018-02-02 (×3): qty 0.4

## 2018-02-02 MED ORDER — ASPIRIN EC 81 MG PO TBEC
81.0000 mg | DELAYED_RELEASE_TABLET | Freq: Every day | ORAL | Status: DC
  Administered 2018-02-02 – 2018-02-04 (×3): 81 mg via ORAL
  Filled 2018-02-02 (×3): qty 1

## 2018-02-02 MED ORDER — MOMETASONE FURO-FORMOTEROL FUM 200-5 MCG/ACT IN AERO
2.0000 | INHALATION_SPRAY | Freq: Two times a day (BID) | RESPIRATORY_TRACT | Status: DC
Start: 2018-02-02 — End: 2018-02-04
  Administered 2018-02-02 – 2018-02-03 (×2): 2 via RESPIRATORY_TRACT
  Filled 2018-02-02 (×3): qty 8.8

## 2018-02-02 MED ORDER — TRAMADOL HCL 50 MG PO TABS: 50.0000 mg | ORAL_TABLET | Freq: Four times a day (QID) | ORAL | Status: DC | PRN

## 2018-02-02 MED ORDER — ONDANSETRON HCL 4 MG PO TABS: 4.0000 mg | ORAL_TABLET | Freq: Four times a day (QID) | ORAL | Status: DC | PRN

## 2018-02-02 MED ORDER — VANCOMYCIN HCL IN DEXTROSE 1-5 GM/200ML-% IV SOLN: 1000.0000 mg | INTRAVENOUS | Status: DC

## 2018-02-02 MED ORDER — ACETAMINOPHEN 650 MG RE SUPP: 650.0000 mg | Freq: Once | RECTAL | Status: DC

## 2018-02-02 MED ORDER — SODIUM CHLORIDE 0.9 % IV SOLN
INTRAVENOUS | Status: DC
  Administered 2018-02-02 – 2018-02-03 (×2): via INTRAVENOUS

## 2018-02-02 MED ORDER — ALLOPURINOL 300 MG PO TABS
150.0000 mg | ORAL_TABLET | Freq: Every evening | ORAL | Status: DC
  Administered 2018-02-02 – 2018-02-03 (×2): 150 mg via ORAL
  Filled 2018-02-02 (×3): qty 1

## 2018-02-02 MED ORDER — POTASSIUM CHLORIDE CRYS ER 20 MEQ PO TBCR
40.0000 meq | EXTENDED_RELEASE_TABLET | ORAL | Status: AC
  Administered 2018-02-02: 40 meq via ORAL
  Filled 2018-02-02: qty 2

## 2018-02-02 MED ORDER — INSULIN ASPART 100 UNIT/ML ~~LOC~~ SOLN
0.0000 [IU] | Freq: Three times a day (TID) | SUBCUTANEOUS | Status: DC
  Administered 2018-02-03: 2 [IU] via SUBCUTANEOUS
  Administered 2018-02-03: 1 [IU] via SUBCUTANEOUS
  Administered 2018-02-03: 2 [IU] via SUBCUTANEOUS
  Administered 2018-02-04 (×2): 1 [IU] via SUBCUTANEOUS

## 2018-02-02 MED ORDER — ACETAMINOPHEN 650 MG RE SUPP: 650.0000 mg | Freq: Four times a day (QID) | RECTAL | Status: DC | PRN

## 2018-02-02 NOTE — Progress Notes (Signed)
Pharmacy Antibiotic Note  [name] [name] is a 84 y.o. female admitted on 02/02/2018 with sepsis.  Pharmacy has been consulted for Vancomycin and Zosyn dosing.  Plan: Zosyn 3.375g IV q8h (4 hour infusion).  Vancomycin 1gm iv x1, then vancomycin 1gm iv q36hr  Goal AUC = 400 - 500 for all indications, except meningitis (goal AUC > 500 and Cmin 15-20 mcg/mL)      Temp (24hrs), Avg:101.6 F (38.7 C), Min:100.8 F (38.2 C), Max:103 F (39.4 C)  Recent Labs  Lab 02/02/18 0212 02/02/18 0222  WBC 8.8  --   CREATININE 1.00  --   LATICACIDVEN  --  1.21    Estimated Creatinine Clearance: 43.8 mL/min (by C-G formula based on SCr of 1 mg/dL).    Allergies  Allergen Reactions  . Clonidine Derivatives Swelling    [name]  . Fish Allergy Diarrhea, Swelling and Other (See Comments)    Turns skin "black," but can tolerate white fish Salmon- Diarrhea  . Shellfish Allergy Hives  . Doxycycline Rash  . Indomethacin     Reaction not recalled by the patient  . Lyrica [Pregabalin]     Hallucinations   . Methyldopa     Aldomet (for hypertension): Reaction not recalled by the patient  . Orange Fruit [Citrus] Other (See Comments)    Indigestion/heartburn  . Cetirizine Hcl Itching and Rash  . Codeine Itching  . Levaquin [Levofloxacin In D5w] Rash  . Tomato Rash    Antimicrobials this admission: Vancomycin 02/02/2018 >> Zosyn 02/02/2018 >>   Dose adjustments this admission: -  Microbiology results: pending  Thank you for allowing pharmacy to be a part of this patient's care.  [name] [name] 02/02/2018 6:20 AM

## 2018-02-02 NOTE — ED Notes (Signed)
Pt CBG 76. Provided 8oz apple juice

## 2018-02-02 NOTE — Plan of Care (Deleted)
History and Physical    [name] [name] YYT:[id number] DOB: Mar 19, 1930 DOA: 02/02/2018  Referring MD/NP/PA: Dr. Rolland Porter PCP: Briscoe Deutscher, DO  Patient coming from: Home via EMS  Chief Complaint: Generalized weakness  I have personally briefly reviewed patient's old medical records in [place]   HPI: [name] [name] is a 84 y.o. female with medical history significant of DM type 2, RA on , OA, gout, CAD, and CVA; who presents with complaints of generalized weakness over the last 2 days.  She reports feeling cold and stiff unable to get up and ambulate with her walker like normal.  Associated symptoms include decreased appetite, 5-6 episodes of diarrhea,  fever, and joint pains(chronic).  Denies having any recent sick contacts, shortness of breath, cough, headache, loss of consciousness, falls, abdominal pain, nausea, vomiting, or dysuria.  Patient notes that she had pinkeye last week and was treated with eye ointment that she has completed.  She was last admitted to the hospital 1/18-1/21, complaining of similar symptoms thought to be possibly secondary to lower extremity wounds wounds and possible cellulitis.  Since that hospitalization she is been followed up by wound care and reports that the wounds have been healing appropriately.  ED Course: Upon admission into the emergency department patient was noted to be febrile up to 103 F, heart rate 94-112, respirations 13-22, blood pressure 164/88-188/84, O2 saturations maintained on room air.  Labs revealed WBC 8.8, hemoglobin 11.4, sodium 134, potassium 3.2, BUN 25, creatinine 1, and lactic acid 1.21. Influenza screen negative.  Urinalysis negative.  Chest x-ray showed no active disease in stable cardiomegaly.  Review of Systems  Constitutional: Positive for chills, fever and malaise/fatigue.  HENT: Negative for ear discharge and nosebleeds.   Eyes: Negative for pain and discharge.  Respiratory: Negative for cough, sputum  production and wheezing.   Cardiovascular: Negative for chest pain and leg swelling.  Gastrointestinal: Positive for diarrhea. Negative for abdominal pain, constipation, nausea and vomiting.  Genitourinary: Negative for dysuria and frequency.  Musculoskeletal: Positive for joint pain. Negative for falls.  Skin: Negative for itching and rash.  Neurological: Negative for loss of consciousness and weakness.  Endo/Heme/Allergies: Negative for polydipsia. Does not bruise/bleed easily.  Psychiatric/Behavioral: Negative for memory loss and substance abuse.    Past Medical History:  Diagnosis Date  . Allergic rhinitis due to pollen 04/27/2007  . Arthritis    "in q joint" (08/07/2013)  . Coronary atherosclerosis of native coronary artery   . Cough   . Disorder of bone and cartilage, unspecified   . Edema 05/03/2013  . Exertional shortness of breath    "sometimes" (08/07/2013)  . First degree atrioventricular block   . Gout, unspecified 04/19/2013  . Hyperlipidemia 04/27/2007  . Hypertension   . Muscle spasm   . Muscle weakness (generalized)   . Myocardial infarction ([place]) ~ 1970  . Onychia and paronychia of toe   . Osteoarthrosis, unspecified whether generalized or localized, unspecified site 04/27/2007  . Other fall   . Other malaise and fatigue   . Prepatellar bursitis   . Seizures ([place])   . Stroke ([place]) 01/17/2014  . TIA (transient ischemic attack)    "a few one summer" (08/07/2013)  . Type II diabetes mellitus ([place])    "fasting 90-110s" (08/07/2013)  . Unspecified essential hypertension 08/08/2013  . Unspecified vitamin D deficiency     Past Surgical History:  Procedure Laterality Date  . ABDOMINAL HYSTERECTOMY  04/1980  . APPENDECTOMY  1960  .  CARDIAC CATHETERIZATION  2003  . CATARACT EXTRACTION W/ INTRAOCULAR LENS  IMPLANT, BILATERAL Bilateral ~ 2012  . JOINT REPLACEMENT    . REPLACEMENT TOTAL KNEE Right 09/2005  . SHOULDER OPEN ROTATOR CUFF REPAIR Right 07/1999  .  TONSILLECTOMY  08/1974  . TOTAL KNEE ARTHROPLASTY Left 08/07/2013   Procedure: TOTAL KNEE ARTHROPLASTY- LEFT;  Surgeon: Vickey Huger, MD;  Location: [facility];  Service: Orthopedics;  Laterality: Left;  . TRANSTHORACIC ECHOCARDIOGRAM  2003   EF 55-65%; mild concentric LVH     reports that she has never smoked. She has never used smokeless tobacco. She reports that she does not drink alcohol or use drugs.  Allergies  Allergen Reactions  . Clonidine Derivatives Swelling    [name]  . Fish Allergy Diarrhea, Swelling and Other (See Comments)    Turns skin "black," but can tolerate white fish Salmon- Diarrhea  . Shellfish Allergy Hives  . Doxycycline Rash  . Indomethacin     Reaction not recalled by the patient  . Lyrica [Pregabalin]     Hallucinations   . Methyldopa     Aldomet (for hypertension): Reaction not recalled by the patient  . Orange Fruit [Citrus] Other (See Comments)    Indigestion/heartburn  . Cetirizine Hcl Itching and Rash  . Codeine Itching  . Levaquin [Levofloxacin In D5w] Rash  . Tomato Rash    Family History  Problem Relation Age of Onset  . Heart attack Father     Prior to Admission medications   Medication Sig Start Date End Date Taking? Authorizing Provider  albuterol (PROVENTIL HFA;VENTOLIN HFA) 108 (90 Base) MCG/ACT inhaler Inhale 1 puff into the lungs every 6 (six) hours as needed for wheezing or shortness of breath.   Yes [provider]  allopurinol (ZYLOPRIM) 300 MG tablet Take 0.5 tablets (150 mg total) by mouth every evening. 11/29/17 02/02/18 Yes Elodia Florence., MD  aspirin EC 81 MG tablet Take 81 mg by mouth daily.   Yes [provider]  calcium-vitamin D (OSCAL WITH D) 500-200 MG-UNIT per tablet Take 1 tablet by mouth daily.    Yes [provider]  CHLOROPHYLL PO Take 5 drops by mouth See admin instructions. MIX INTO 6 OUNCES OF JUICE AND DRINK  ONCE A DAY   Yes [provider]  diclofenac sodium (VOLTAREN) 1 % GEL Apply 1 application topically daily.  01/11/18  Yes [provider]  ferrous sulfate 325 (65 FE) MG tablet Take 325 mg by mouth daily.    Yes [provider]  Fluticasone-Salmeterol (ADVAIR DISKUS) 250-50 MCG/DOSE AEPB Inhale 1 puff into the lungs 2 (two) times daily. 05/03/13  Yes [provider]  furosemide (LASIX) 20 MG tablet Take 1 tablet (20 mg total) by mouth every other day. 05/27/17  Yes Nita Sells, MD  gabapentin (NEURONTIN) 100 MG capsule Take 1 capsule (100 mg total) at bedtime by mouth. 09/13/17  Yes Briscoe Deutscher, DO  hydrALAZINE (APRESOLINE) 100 MG tablet take 1 tablet by mouth three times a day 12/30/17  Yes Briscoe Deutscher, DO  hydroxypropyl methylcellulose / hypromellose (ISOPTO TEARS / GONIOVISC) 2.5 % ophthalmic solution Place 1 drop into both eyes 4 (four) times daily as needed for dry eyes. 07/07/17  Yes Briscoe Deutscher, DO  labetalol (NORMODYNE) 100 MG tablet take 2 tablets by mouth twice a day 01/11/18  Yes Briscoe Deutscher, DO  metFORMIN (GLUCOPHAGE) 500 MG tablet Take 1 tablet (500 mg total)  by mouth daily with breakfast. 01/28/18  Yes Briscoe Deutscher, DO  methotrexate (RHEUMATREX) 2.5 MG tablet Take 10 mg by mouth once a week.  11/26/17  Yes [provider]  Multiple Vitamins-Minerals (ALIVE ONCE DAILY WOMENS PO) Take 15 mLs by mouth every other day.    Yes [provider]  oxycodone (OXY-IR) 5 MG capsule Take 1 capsule (5 mg total) by mouth every 4 (four) hours as needed for pain. 12/03/17  Yes Briscoe Deutscher, DO  predniSONE (DELTASONE) 10 MG tablet Take 1 tablet (10 mg total) by mouth daily with breakfast. 12/03/17  Yes Briscoe Deutscher, DO  traMADol (ULTRAM) 50 MG tablet Take 1-2 tablets every 6 hours as needed for pain 12/03/17  Yes Briscoe Deutscher, DO  vitamin C (ASCORBIC ACID) 500 MG tablet Take 500 mg by mouth every other day.    Yes [provider]  blood glucose meter kit and supplies KIT Dispense based on patient and insurance preference. Use up to four times daily as directed. (FOR ICD-9 250.00, 250.01). 07/30/17   Briscoe Deutscher, DO  erythromycin ophthalmic ointment Use one half inch four times daily to affected eye (s) x 7 days. Patient not taking: Reported on 02/02/2018 01/22/18   Burnard Hawthorne, FNP  glucose blood test strip Test QID dx code E11.9 12/24/17   Briscoe Deutscher, DO  ketoconazole (NIZORAL) 2 % cream Apply 1 application topically 2 (two) times daily. Patient not taking: Reported on 02/02/2018 12/03/16   Robyn Haber, MD  triamcinolone ointment (KENALOG) 0.5 % Apply 1 application 2 (two) times daily topically. Patient not taking: Reported on 02/02/2018 09/13/17   Briscoe Deutscher, DO    Physical Exam:  Constitutional: Elderly female who appears to be ill Vitals:   02/02/18 0430 02/02/18 0500 02/02/18 0510 02/02/18 0529  BP: (!) 164/90 (!) 186/90    Pulse: 99 95    Resp: 13 (!) 22    Temp:   (!) 100.8 F (38.2 C) (!) 101.1 F (38.4 C)  TempSrc:    Rectal  SpO2: 100% 99%     Eyes: PERRL, lids and conjunctivae normal ENMT: Mucous membranes are dry.Posterior pharynx clear of any exudate or lesions.  Neck: normal, supple, no masses, no thyromegaly Respiratory: clear to auscultation bilaterally, no wheezing, no crackles. Normal respiratory effort. No accessory muscle use.  Cardiovascular: Regular rate and rhythm, no murmurs / rubs / gallops. No extremity edema. 2+ pedal pulses. No carotid bruits.  Abdomen: no tenderness, no masses palpated. No hepatosplenomegaly. Bowel sounds positive.  Musculoskeletal: no clubbing / cyanosis. No joint deformity upper and lower extremities. Good ROM, no contractures. Normal muscle tone.  Skin: Previously noted ulcerations appear to be healing. Neurologic: CN 2-12 grossly intact. Sensation intact, DTR normal. Strength 5/5 in all 4.  Psychiatric: Normal judgment and insight. Alert and  oriented x 3. Normal mood.     Labs on Admission: I have personally reviewed following labs and imaging studies  CBC: Recent Labs  Lab 02/02/18 0212  WBC 8.8  NEUTROABS 6.6  HGB 11.4*  HCT 35.2*  MCV 98.9  PLT 662   Basic Metabolic Panel: Recent Labs  Lab 02/02/18 0212  NA 134*  K 3.2*  CL 100*  CO2 24  GLUCOSE 90  BUN 25*  CREATININE 1.00  CALCIUM 8.7*   GFR: Estimated Creatinine Clearance: 43.8 mL/min (by C-G formula based on SCr of 1 mg/dL). Liver Function Tests: Recent Labs  Lab 02/02/18 0212  AST 17  ALT 11*  ALKPHOS 44  BILITOT 0.4  PROT 6.0*  ALBUMIN 3.3*   No results for input(s): LIPASE, AMYLASE in the last 168 hours. No results for input(s): AMMONIA in the last 168 hours. Coagulation Profile: No results for input(s): INR, PROTIME in the last 168 hours. Cardiac Enzymes: No results for input(s): CKTOTAL, CKMB, CKMBINDEX, TROPONINI in the last 168 hours. BNP (last 3 results) No results for input(s): PROBNP in the last 8760 hours. HbA1C: No results for input(s): HGBA1C in the last 72 hours. CBG: No results for input(s): GLUCAP in the last 168 hours. Lipid Profile: No results for input(s): CHOL, HDL, LDLCALC, TRIG, CHOLHDL, LDLDIRECT in the last 72 hours. Thyroid Function Tests: No results for input(s): TSH, T4TOTAL, FREET4, T3FREE, THYROIDAB in the last 72 hours. Anemia Panel: No results for input(s): VITAMINB12, FOLATE, FERRITIN, TIBC, IRON, RETICCTPCT in the last 72 hours. Urine analysis:    Component Value Date/Time   COLORURINE STRAW (A) 02/02/2018 0448   APPEARANCEUR CLEAR 02/02/2018 0448   LABSPEC 1.008 02/02/2018 0448   PHURINE 5.0 02/02/2018 0448   GLUCOSEU NEGATIVE 02/02/2018 0448   HGBUR NEGATIVE 02/02/2018 0448   BILIRUBINUR NEGATIVE 02/02/2018 0448   BILIRUBINUR Negative 01/13/2017 1234   KETONESUR NEGATIVE 02/02/2018 0448   PROTEINUR NEGATIVE 02/02/2018 0448   UROBILINOGEN 0.2 01/13/2017 1234   UROBILINOGEN 1.0 09/11/2015  0135   NITRITE NEGATIVE 02/02/2018 0448   LEUKOCYTESUR NEGATIVE 02/02/2018 0448   Sepsis Labs: No results found for this or any previous visit (from the past 240 hour(s)).   Radiological Exams on Admission: Dg Chest Port 1 View  Result Date: 02/02/2018 CLINICAL DATA:  84 year old female with fever and weakness. EXAM: PORTABLE CHEST 1 VIEW COMPARISON:  Chest radiograph dated 11/26/2017 FINDINGS: Stable cardiomegaly. No congestion or edema. There is no focal consolidation, pleural effusion, or pneumothorax. Degenerative changes of the shoulders and right acromial pins. No acute osseous pathology. IMPRESSION: No active disease. Cardiomegaly. Electronically Signed   By: Anner Crete [name]   On: 02/02/2018 02:25    EKG: Independently reviewed.  Sinus tachycardia at 107 bpm  Assessment/Plan SIRS: Acute.  Patient presents with a fever up to 103 F, tachycardia, and tachypnea.  WBC noted to be 8.8 with reassuring lactic acid of 1.21.  Influenza screen negative.  Urinalysis and chest x-ray showed no clear signs of infection.  Given patient is immunocompromise question underlying infection.  Patient started on sepsis protocol with full 3 L fluid bolus.  Symptoms could be 2/2 viral etiology vs flare patient's rheumatoid arthritis. - Admit to telemetry bed - Follow-up blood cultures - Add on respiratory virus panel - Add on ESR and CRP  - Continue empiric antibiotics of vancomycin and Zosyn  Diarrhea: Acute.  Patient reports having 4-5 episodes of diarrhea prior to arrival.  Last reports having antibiotics 2 months ago. - Check GI panel and C. Difficile  Rheumatoid arthritis: Patient noted to have healing lower extremity wounds. - Follow-up ESR  - Continue prednisone, methotrexate - Wound care consult   Gout: Patient does not appear to be having an acute flare  - Add on uric acid level - Continue allopurinol  Hypokalemia: Acute.  Initial potassium noted by 3.2 on admission. - Give 40 mEq  of potassium chloride x1 dose now - Continue to monitor and replace as needed  Diastolic CHF: She does not appear to be fluid overloaded at this time.  Last echocardiogram showing EF of 66 5% with grade 1 diastolic dysfunction back in 01/2014. - Continue to  monitor  Reactive airway disease - Continue pharmacy substitution of Dulera  Normocytic normochromic anemia: Hemoglobin noted to be 11.4 on admission. - Continue ferrous sulfate  Essential hypertension  - Continue hydralazine   Diabetes mellitus type 2: Well controlled.  [name] last hemoglobin A1c noted to be 5.4 on 11/26/2017. - Hypoglycemic protocols - Hold metformin - CBGs q. before meals with sensitive SSI  DVT prophylaxis: Lovenox Code Status: DNR Family Communication: None Disposition Plan: TBD  Consults called: None Admission status: Inpatient  Norval Morton MD [facility] Pager [phone]   If 7PM-7AM, please contact night-coverage [email or web address] Password [facility]  02/02/2018, 5:46 AM

## 2018-02-02 NOTE — Consult Note (Addendum)
WOC Nurse wound consult note Reason for Consult: Consult requested for right leg wounds.  Pt is followed by the outpatient wound care center for serial debridements and Santyl ointment. Wound type: 2 full thickness wounds which patient states have greatly improved. Measurement: .2X.2X.1cm, dry yellow wound bed, no odor or drainage .6X.6X.2cm, dry yellow wound bed, no odor or drainage Intact skin surrounding with darker colored scar tissue from previous wound areas which have healed. Dressing procedure/placement/frequency: Continue present plan of care with Santyl for enzymatic debridement, foam dressing to protect from further injury.  Discussed plan of care with patient and family member.  Pt can resume follow-up with the outpatient wound care center after discharge. Please re-consult if further assistance is needed.  Thank-you,  Cammie Mcgee MSN, RN, CWOCN, [place], CNS [phone]

## 2018-02-02 NOTE — Progress Notes (Signed)
A consult was received from an ED physician for Vancomycin and Zosyn per pharmacy dosing.  The patient's profile has been reviewed for ht/wt/allergies/indication/available labs.   A one time order has been placed for Vancomycin 1gm iv x1, and Zosyn 3.375gm iv x1.  Further antibiotics/pharmacy consults should be ordered by admitting physician if indicated.                       Thank you, Aleene Davidson [name] 02/02/2018  4:22 AM

## 2018-02-02 NOTE — Care Management Note (Signed)
Case Management Note  Patient Details  Name: [name] [name] MRN: [id number] Date of Birth: January 06, 1930  CM noted pt was active with [facility].  Contacted [name] who advised pt had HH RN OT.  Additionally advised that pt has PCS through [facility].  Expected Discharge Date:  (unknown)               Expected Discharge Plan:  Home w Home Health Services  Discharge planning Services  CM Consult  Post Acute Care Choice:  Home Health Choice offered to:  Patient  HH Arranged:  RN, PCS/Personal Care Services, OT [facility] Agency:  [facility]  Status of Service:  In process, will continue to follow  Rica Koyanagi, RN 02/02/2018, 10:53 AM

## 2018-02-02 NOTE — ED Notes (Signed)
ED TO INPATIENT HANDOFF REPORT  Name/Age/Gender [name] [name] 84 y.o. female  Code Status    Code Status Orders  (From admission, onward)        Start     Ordered   02/02/18 0605  Do not attempt resuscitation (DNR)  Continuous    Question Answer Comment  In the event of cardiac or respiratory ARREST Do not call a "code blue"   In the event of cardiac or respiratory ARREST Do not perform Intubation, CPR, defibrillation or ACLS   In the event of cardiac or respiratory ARREST Use medication by any route, position, wound care, and other measures to relive pain and suffering. May use oxygen, suction and manual treatment of airway obstruction as needed for comfort.      02/02/18 0606    Code Status History    Date Active Date Inactive Code Status Order ID Comments User Context   11/26/2017 2055 11/29/2017 1715 DNR [id number]  Elodia Florence., MD Inpatient   05/25/2017 1811 05/27/2017 1956 DNR [id number]  Caren Griffins, MD Inpatient   11/29/2016 1522 12/01/2016 1509 Full Code [id number]  Elwin Mocha, MD ED   09/11/2015 0454 09/12/2015 1432 Full Code [id number]  Etta Quill, DO ED   06/19/2015 2338 06/21/2015 1941 Full Code [id number]  Etta Quill, DO ED   01/25/2014 2245 06/19/2015 2338 Full Code [id number]  Hennie Duos, MD Outpatient   01/17/2014 1305 01/22/2014 2142 Full Code [id number]  Allie Bossier, MD ED      Home/SNF/Other Home  Chief Complaint Weakness;Fever  Level of Care/Admitting Diagnosis ED Disposition    ED Disposition Condition Comment   Admit  Hospital Area: [facility] [[id number]]  Level of Care: Telemetry [5]  Admit to tele based on following criteria: Complex arrhythmia (Bradycardia/Tachycardia)  Diagnosis: SIRS (systemic inflammatory response syndrome) [facility]) [[id number]]  Admitting Physician: Norval Morton [[id number]]  Attending Physician: Norval Morton [[id number]]  Estimated length of stay: past midnight tomorrow   Certification:: I certify this patient will need inpatient services for at least 2 midnights  PT Class (Do Not Modify): Inpatient [101]  PT Acc Code (Do Not Modify): Private [1]       Medical History Past Medical History:  Diagnosis Date  . Allergic rhinitis due to pollen 04/27/2007  . Arthritis    "in q joint" (08/07/2013)  . Coronary atherosclerosis of native coronary artery   . Cough   . Disorder of bone and cartilage, unspecified   . Edema 05/03/2013  . Exertional shortness of breath    "sometimes" (08/07/2013)  . First degree atrioventricular block   . Gout, unspecified 04/19/2013  . Hyperlipidemia 04/27/2007  . Hypertension   . Muscle spasm   . Muscle weakness (generalized)   . Myocardial infarction ([place]) ~ 1970  . Onychia and paronychia of toe   . Osteoarthrosis, unspecified whether generalized or localized, unspecified site 04/27/2007  . Other fall   . Other malaise and fatigue   . Prepatellar bursitis   . Seizures ([place])   . Stroke ([place]) 01/17/2014  . TIA (transient ischemic attack)    "a few one summer" (08/07/2013)  . Type II diabetes mellitus ([place])    "fasting 90-110s" (08/07/2013)  . Unspecified essential hypertension 08/08/2013  . Unspecified vitamin D deficiency     Allergies Allergies  Allergen Reactions  . Clonidine Derivatives Swelling    [name]  . Fish  Allergy Diarrhea, Swelling and Other (See Comments)    Turns skin "black," but can tolerate white fish Salmon- Diarrhea  . Shellfish Allergy Hives  . Doxycycline Rash  . Indomethacin     Reaction not recalled by the patient  . Lyrica [Pregabalin]     Hallucinations   . Methyldopa     Aldomet (for hypertension): Reaction not recalled by the patient  . Orange Fruit [Citrus] Other (See Comments)    Indigestion/heartburn  . Cetirizine Hcl Itching and Rash  . Codeine Itching  . Levaquin [Levofloxacin In D5w] Rash  . Tomato  Rash    IV Location/Drains/Wounds Patient Lines/Drains/Airways Status   Active Line/Drains/Airways    Name:   Placement date:   Placement time:   Site:   Days:   Peripheral IV 02/02/18 Left;Posterior;Lateral Hand   02/02/18    -    Hand   less than 1   Peripheral IV 02/02/18 Left;Posterior Forearm   02/02/18    0244    Forearm   less than 1   External Urinary Catheter   11/27/17    1615    -   67   Incision 08/07/13 Knee Left   08/07/13    1019     1640   Wound / Incision (Open or Dehisced) 05/25/17 Non-pressure wound Leg Right 3 circular wound/pt ff/up with dermatologist   05/25/17    2030    Leg   253   Wound / Incision (Open or Dehisced) 11/27/17 Non-pressure wound Leg Anterior;Lower;Right   11/27/17    0036    Leg   67          Labs/Imaging Results for orders placed or performed during the hospital encounter of 02/02/18 (from the past 48 hour(s))  Comprehensive metabolic panel     Status: Abnormal   Collection Time: 02/02/18  2:12 AM  Result Value Ref Range   Sodium 134 (L) 135 - 145 mmol/L   Potassium 3.2 (L) 3.5 - 5.1 mmol/L   Chloride 100 (L) 101 - 111 mmol/L   CO2 24 22 - 32 mmol/L   Glucose, Bld 90 65 - 99 mg/dL   BUN 25 (H) 6 - 20 mg/dL   Creatinine, Ser 1.00 0.44 - 1.00 mg/dL   Calcium 8.7 (L) 8.9 - 10.3 mg/dL   Total Protein 6.0 (L) 6.5 - 8.1 g/dL   Albumin 3.3 (L) 3.5 - 5.0 g/dL   AST 17 15 - 41 U/L   ALT 11 (L) 14 - 54 U/L   Alkaline Phosphatase 44 38 - 126 U/L   Total Bilirubin 0.4 0.3 - 1.2 mg/dL   GFR calc non Af Amer 50 (L) >60 mL/min   GFR calc Af Amer 58 (L) >60 mL/min    Comment: (NOTE) The eGFR has been calculated using the CKD EPI equation. This calculation has not been validated in all clinical situations. eGFR's persistently <60 mL/min signify possible Chronic Kidney Disease.    Anion gap 10 5 - 15    Comment: Performed at [facility], [place] [address], [place], [place] [address]  CBC with Differential     Status: Abnormal    Collection Time: 02/02/18  2:12 AM  Result Value Ref Range   WBC 8.8 4.0 - 10.5 K/uL   RBC 3.56 (L) 3.87 - 5.11 MIL/uL   Hemoglobin 11.4 (L) 12.0 - 15.0 g/dL   HCT 35.2 (L) 36.0 - 46.0 %   MCV 98.9 78.0 - 100.0 fL  MCH 32.0 26.0 - 34.0 pg   MCHC 32.4 30.0 - 36.0 g/dL   RDW 17.5 (H) 11.5 - 15.5 %   Platelets 258 150 - 400 K/uL   Neutrophils Relative % 76 %   Neutro Abs 6.6 1.7 - 7.7 K/uL   Lymphocytes Relative 15 %   Lymphs Abs 1.3 0.7 - 4.0 K/uL   Monocytes Relative 8 %   Monocytes Absolute 0.7 0.1 - 1.0 K/uL   Eosinophils Relative 1 %   Eosinophils Absolute 0.1 0.0 - 0.7 K/uL   Basophils Relative 0 %   Basophils Absolute 0.0 0.0 - 0.1 K/uL    Comment: Performed at [facility], [place] [address] [place], [place] [address]  Sedimentation rate     Status: None   Collection Time: 02/02/18  2:12 AM  Result Value Ref Range   Sed Rate 10 0 - 22 mm/hr    Comment: Performed at [facility], [place] [address], [place], [place] [address]  Procalcitonin     Status: None   Collection Time: 02/02/18  2:12 AM  Result Value Ref Range   Procalcitonin 0.13 ng/mL    Comment:        Interpretation: PCT (Procalcitonin) <= 0.5 ng/mL: Systemic infection (sepsis) is not likely. Local bacterial infection is possible. (NOTE)       Sepsis PCT Algorithm           Lower Respiratory Tract                                      Infection PCT Algorithm    ----------------------------     ----------------------------         PCT < 0.25 ng/mL                PCT < 0.10 ng/mL         Strongly encourage             Strongly discourage   discontinuation of antibiotics    initiation of antibiotics    ----------------------------     -----------------------------       PCT 0.25 - 0.50 ng/mL            PCT 0.10 - 0.25 ng/mL               OR       >80% decrease in PCT            Discourage initiation of                                            antibiotics      Encourage  discontinuation           of antibiotics    ----------------------------     -----------------------------         PCT >= 0.50 ng/mL              PCT 0.26 - 0.50 ng/mL               AND        <80% decrease in PCT             Encourage initiation of  antibiotics       Encourage continuation           of antibiotics    ----------------------------     -----------------------------        PCT >= 0.50 ng/mL                  PCT > 0.50 ng/mL               AND         increase in PCT                  Strongly encourage                                      initiation of antibiotics    Strongly encourage escalation           of antibiotics                                     -----------------------------                                           PCT <= 0.25 ng/mL                                                 OR                                        > 80% decrease in PCT                                     Discontinue / Do not initiate                                             antibiotics Performed at [place] [address], [place], [place] [address]   Uric acid     Status: None   Collection Time: 02/02/18  2:12 AM  Result Value Ref Range   Uric Acid, Serum 6.1 2.3 - 6.6 mg/dL    Comment: Performed at [facility], [place] [address], [place], [place] [address]  I-Stat CG4 Lactic Acid, ED     Status: None   Collection Time: 02/02/18  2:22 AM  Result Value Ref Range   Lactic Acid, Venous 1.21 0.5 - 1.9 mmol/L  Influenza panel by PCR (type A & B)     Status: None   Collection Time: 02/02/18  4:00 AM  Result Value Ref Range   Influenza A By PCR NEGATIVE NEGATIVE   Influenza B By PCR NEGATIVE NEGATIVE    Comment: (NOTE) The Xpert Xpress Flu assay is intended as an aid in the diagnosis of  influenza and should not be used as a sole basis for  treatment.  This  assay is FDA approved for nasopharyngeal  swab specimens only. Nasal  washings and aspirates are unacceptable for Xpert Xpress Flu testing. Performed at [facility], [place] [address] [place], [place] [address]   Urinalysis, Routine w reflex microscopic     Status: Abnormal   Collection Time: 02/02/18  4:48 AM  Result Value Ref Range   Color, Urine STRAW (A) YELLOW   APPearance CLEAR CLEAR   Specific Gravity, Urine 1.008 1.005 - 1.030   pH 5.0 5.0 - 8.0   Glucose, UA NEGATIVE NEGATIVE mg/dL   Hgb urine dipstick NEGATIVE NEGATIVE   Bilirubin Urine NEGATIVE NEGATIVE   Ketones, ur NEGATIVE NEGATIVE mg/dL   Protein, ur NEGATIVE NEGATIVE mg/dL   Nitrite NEGATIVE NEGATIVE   Leukocytes, UA NEGATIVE NEGATIVE    Comment: Performed at [place] [address], [place], [place] [address]  C-reactive protein     Status: Abnormal   Collection Time: 02/02/18  6:33 AM  Result Value Ref Range   CRP 6.4 (H) <1.0 mg/dL    Comment: Performed at [facility] [address], [place], [place] [address]  CBG monitoring, ED     Status: None   Collection Time: 02/02/18  9:51 AM  Result Value Ref Range   Glucose-Capillary 76 65 - 99 mg/dL  C difficile quick scan w PCR reflex     Status: None   Collection Time: 02/02/18 12:21 PM  Result Value Ref Range   C Diff antigen NEGATIVE NEGATIVE   C Diff toxin NEGATIVE NEGATIVE   C Diff interpretation No C. difficile detected.     Comment: Performed at [facility], [place] [address] [place],  [address]  CBG monitoring, ED     Status: None   Collection Time: 02/02/18 12:22 PM  Result Value Ref Range   Glucose-Capillary 96 65 - 99 mg/dL   Dg Chest Port 1 View  Result Date: 02/02/2018 CLINICAL DATA:  84 year old female with fever and weakness. EXAM: PORTABLE CHEST 1 VIEW COMPARISON:  Chest radiograph dated 11/26/2017 FINDINGS: Stable cardiomegaly. No congestion or edema. There is no focal consolidation, pleural effusion, or  pneumothorax. Degenerative changes of the shoulders and right acromial pins. No acute osseous pathology. IMPRESSION: No active disease. Cardiomegaly. Electronically Signed   By: Anner Crete [name]   On: 02/02/2018 02:25    Pending Labs Unresulted Labs (From admission, onward)   Start     Ordered   02/03/18 0500  CBC  Tomorrow morning,   R     02/02/18 0606   02/03/18 5102  Basic metabolic panel  Tomorrow morning,   R     02/02/18 0606   02/02/18 0629  Gastrointestinal Panel by PCR , Stool  (Gastrointestinal Panel by PCR, Stool)  Once,   R     02/02/18 0629   02/02/18 0600  Respiratory Panel by PCR  (Respiratory virus panel)  Add-on,   R     02/02/18 0606   02/02/18 0209  Urine culture  STAT,   STAT    Question:  Patient immune status  Answer:  Normal   02/02/18 0209   02/02/18 0206  Culture, blood (Routine x 2)  BLOOD CULTURE X 2,   STAT     02/02/18 0207      Vitals/Pain Today's Vitals   02/02/18 1300 02/02/18 1330 02/02/18 1437 02/02/18 1537  BP: (!) 159/90 (!) 169/82 (!) 150/87 (!) 166/87  Pulse: 99 (!)  103 (!) 108 (!) 109  Resp: 19 13 (!) 23 (!) 22  Temp:      TempSrc:      SpO2: 99% 100% 100% 100%    Isolation Precautions Enteric precautions (UV disinfection)  Medications Medications  aspirin EC tablet 81 mg (81 mg Oral Given 02/02/18 1207)  furosemide (LASIX) tablet 20 mg (20 mg Oral Given 02/02/18 1212)  hydrALAZINE (APRESOLINE) tablet 100 mg (100 mg Oral Given 02/02/18 1208)  polyvinyl alcohol (LIQUIFILM TEARS) 1.4 % ophthalmic solution 1 drop (has no administration in time range)  traMADol (ULTRAM) tablet 50 mg (has no administration in time range)  predniSONE (DELTASONE) tablet 10 mg (10 mg Oral Given 02/02/18 0929)  gabapentin (NEURONTIN) capsule 100 mg (has no administration in time range)  ferrous sulfate tablet 325 mg (325 mg Oral Given 02/02/18 1212)  allopurinol (ZYLOPRIM) tablet 150 mg (has no administration in time range)  enoxaparin (LOVENOX) injection  40 mg (40 mg Subcutaneous Given 02/02/18 1213)  acetaminophen (TYLENOL) tablet 650 mg (has no administration in time range)    Or  acetaminophen (TYLENOL) suppository 650 mg (has no administration in time range)  ondansetron (ZOFRAN) tablet 4 mg (has no administration in time range)    Or  ondansetron (ZOFRAN) injection 4 mg (has no administration in time range)  ipratropium-albuterol (DUONEB) 0.5-2.5 (3) MG/3ML nebulizer solution 3 mL (has no administration in time range)  insulin aspart (novoLOG) injection 0-9 Units (0 Units Subcutaneous Not Given 02/02/18 1223)  piperacillin-tazobactam (ZOSYN) IVPB 3.375 g (3.375 g Intravenous New Bag/Given 02/02/18 1332)  vancomycin (VANCOCIN) IVPB 1000 mg/200 mL premix (has no administration in time range)  hydrALAZINE (APRESOLINE) injection 10 mg (has no administration in time range)  mometasone-formoterol (DULERA) 200-5 MCG/ACT inhaler 2 puff (2 puffs Inhalation Given 02/02/18 1220)  collagenase (SANTYL) ointment (0 application Topical Hold 02/02/18 1254)  loratadine (CLARITIN) tablet 10 mg (10 mg Oral Given 02/02/18 1236)  sodium chloride 0.9 % bolus 1,000 mL (0 mLs Intravenous Stopped 02/02/18 0445)    And  sodium chloride 0.9 % bolus 1,000 mL (0 mLs Intravenous Stopped 02/02/18 0446)    And  sodium chloride 0.9 % bolus 1,000 mL (0 mLs Intravenous Stopped 02/02/18 0616)  acetaminophen (TYLENOL) tablet 650 mg (650 mg Oral Given 02/02/18 0257)  piperacillin-tazobactam (ZOSYN) IVPB 3.375 g (0 g Intravenous Stopped 02/02/18 0526)  vancomycin (VANCOCIN) IVPB 1000 mg/200 mL premix (0 mg Intravenous Stopped 02/02/18 0554)  potassium chloride SA (K-DUR,KLOR-CON) CR tablet 40 mEq (40 mEq Oral Given 02/02/18 0727)    Mobility walks with person assist

## 2018-02-02 NOTE — ED Notes (Signed)
Pt given her meal tray and daughter is feeding pt.

## 2018-02-02 NOTE — ED Notes (Signed)
Patient stated she felt like she could urinate so a purewick is in place to collect a sample. Pericare completed before purewick was put in place.

## 2018-02-02 NOTE — ED Notes (Signed)
Bed: PQ33 Expected date:  Expected time:  Means of arrival:  Comments: 87 yr fever

## 2018-02-02 NOTE — Progress Notes (Signed)
PROGRESS NOTE    [name] [name]  XTG:[id number] DOB: Mar 16, 1930 DOA: 02/02/2018 PCP: Helane Rima, DO    Brief Narrative:  84 year old female who presented with generalized weakness.  She does have a significant past medical history of type 2 diabetes mellitus, RA/OA, coronary disease, chronic lower extremity wounds, and history of CVA.  Patient complains of generalized weakness for the last 2 days prior to hospitalization. She felt stiff, unable to ambulate, decreased appetite, diarrhea, and fevers.  On initial physical examination blood pressure 173/83, heart rate 91, respiration 19, temperature 101.1, oxygen saturation 99%, dry mucous membranes, lungs clear to auscultation bilaterally, heart sounds S1, S2 present rhythmic, abdomen soft nontender, no lower extremity edema.  Sodium 134, potassium 3.2, chloride 100, bicarb 24, glucose 90, BUN 25, creatinine 1.0, white count 8.8, hemoglobin 9.4, hematocrit 35.2, platelets 258, urinalysis negative for infection.  Chest x-ray, right rotation, hyperinflation, no infiltrates.  EKG sinus tachycardia 107 bpm, normal axis, normal intervals.  Patient was admitted to the hospital with febrile syndrome, rule out sepsis.   Assessment & Plan:   Principal Problem:   SIRS (systemic inflammatory response syndrome) (HCC) Active Problems:   Reactive airway disease   Gout   HTN (hypertension)   Rheumatoid arthritis (HCC)   DM (diabetes mellitus), type 2 with renal complications (HCC)   Diarrhea   Hypokalemia   1.  Febrile syndrome. Will continue temperature curve and cell count monitoring, no signs of bacterial infection, considering malaise and diarrhea, possible viral syndrome. Continue hydration with IV fluids and supportive medical care. Hold on antibiotic therapy for now, follow on blood cultures.   2. HTN. Will continue blood pressure control with hydralazine, systolic 147 mmHg.  3. T2DM. Will continue glucose cover and monitoring with insulin  sliding scale, capillary glucose 76, 96, 96. Patient tolerating po well.   4. Hypokalemia. Will continue k correction with kcl, will follow on renal panel in am, renal function with stable cr at 1.0.   5. Gout. No acute arthritis, uric acid elevated at 6.1, continue allopurinol.      DVT prophylaxis: enoxaparin  Code Status: dnr Family Communication: I spoke with patient's family at the bedside and all questions were addressed.  Disposition Plan: home if afebrile in 24 hours.   Consultants:    Procedures:    Antimicrobials:      Subjective: Patient feeling aches and pains, had difficulty ambulating, positive diarrhea and fever at home.   Objective: Vitals:   02/02/18 0900 02/02/18 0930 02/02/18 1000 02/02/18 1112  BP: (!) 159/85 (!) 182/79 (!) 175/88 (!) 192/61  Pulse: 99 88 99 95  Resp: 20 19 14 19   Temp:      TempSrc:      SpO2: 99% 97% 100% 100%    Intake/Output Summary (Last 24 hours) at 02/02/2018 1148 Last data filed at 02/02/2018 0520 Gross per 24 hour  Intake 2000 ml  Output 500 ml  Net 1500 ml   There were no vitals filed for this visit.  Examination:   General: Not in pain or dyspnea,deconditioned Neurology: Awake and alert, non focal  E ENT: mild pallor, no icterus, oral mucosa moist Cardiovascular: No JVD. S1-S2 present, rhythmic, no gallops, rubs, or murmurs. No lower extremity edema. Pulmonary: decreased breath sounds bilaterally at bases, adequate air movement, no wheezing, rhonchi or rales. Gastrointestinal. Abdomen protuberant, no organomegaly, non tender, no rebound or guarding Skin. Right leg with ulcerated lesion, anteriorly. No erythema.  Musculoskeletal: no joint deformities  Data Reviewed: I have personally reviewed following labs and imaging studies  CBC: Recent Labs  Lab 02/02/18 0212  WBC 8.8  NEUTROABS 6.6  HGB 11.4*  HCT 35.2*  MCV 98.9  PLT 258   Basic Metabolic Panel: Recent Labs  Lab 02/02/18 0212  NA  134*  K 3.2*  CL 100*  CO2 24  GLUCOSE 90  BUN 25*  CREATININE 1.00  CALCIUM 8.7*   GFR: Estimated Creatinine Clearance: 43.8 mL/min (by C-G formula based on SCr of 1 mg/dL). Liver Function Tests: Recent Labs  Lab 02/02/18 0212  AST 17  ALT 11*  ALKPHOS 44  BILITOT 0.4  PROT 6.0*  ALBUMIN 3.3*   No results for input(s): LIPASE, AMYLASE in the last 168 hours. No results for input(s): AMMONIA in the last 168 hours. Coagulation Profile: No results for input(s): INR, PROTIME in the last 168 hours. Cardiac Enzymes: No results for input(s): CKTOTAL, CKMB, CKMBINDEX, TROPONINI in the last 168 hours. BNP (last 3 results) No results for input(s): PROBNP in the last 8760 hours. HbA1C: No results for input(s): HGBA1C in the last 72 hours. CBG: Recent Labs  Lab 02/02/18 0951  GLUCAP 76   Lipid Profile: No results for input(s): CHOL, HDL, LDLCALC, TRIG, CHOLHDL, LDLDIRECT in the last 72 hours. Thyroid Function Tests: No results for input(s): TSH, T4TOTAL, FREET4, T3FREE, THYROIDAB in the last 72 hours. Anemia Panel: No results for input(s): VITAMINB12, FOLATE, FERRITIN, TIBC, IRON, RETICCTPCT in the last 72 hours.    Radiology Studies: I have reviewed all of the imaging during this hospital visit personally     Scheduled Meds: . allopurinol  150 mg Oral QPM  . aspirin EC  81 mg Oral Daily  . collagenase   Topical Daily  . enoxaparin (LOVENOX) injection  40 mg Subcutaneous Q24H  . ferrous sulfate  325 mg Oral Daily  . furosemide  20 mg Oral QODAY  . gabapentin  100 mg Oral QHS  . hydrALAZINE  100 mg Oral TID  . insulin aspart  0-9 Units Subcutaneous TID WC  . mometasone-formoterol  2 puff Inhalation BID  . predniSONE  10 mg Oral Q breakfast   Continuous Infusions: . piperacillin-tazobactam (ZOSYN)  IV    . [START ON 02/03/2018] vancomycin       LOS: 0 days        Arelyn Gauer Annett Gula, MD [facility] Pager [phone]

## 2018-02-02 NOTE — ED Provider Notes (Signed)
[facility] DEPT Provider Note   CSN: [id number] Arrival date & time: 02/02/18  0155   Time seen 03:00 AM   History   Chief Complaint Chief Complaint  Patient presents with  . Weakness  . Fever    HPI [name] [name] is a 84 y.o. female.  HPI patient states she woke up this morning feeling cold and weak.  She states she normally uses a walker without assistance however today she was having trouble getting around in a walker due to weakness.  She denies cough, shortness of breath, chest pain, sore throat, rhinorrhea, sneezing, nausea, vomiting, abdominal pain, dysuria, or frequency.  She states she did have some diarrhea about 4 times a day.  The first time was soft and then became watery.  She denies any change in her diet or recent antibiotics.  She also complains of having chills all day today and lost some appetite.  She states she did have the flu shot this season.  She is not had any known sick contacts.  Patient is on methotrexate.  PCP Briscoe Deutscher, DO   Past Medical History:  Diagnosis Date  . Allergic rhinitis due to pollen 04/27/2007  . Arthritis    "in q joint" (08/07/2013)  . Coronary atherosclerosis of native coronary artery   . Cough   . Disorder of bone and cartilage, unspecified   . Edema 05/03/2013  . Exertional shortness of breath    "sometimes" (08/07/2013)  . First degree atrioventricular block   . Gout, unspecified 04/19/2013  . Hyperlipidemia 04/27/2007  . Hypertension   . Muscle spasm   . Muscle weakness (generalized)   . Myocardial infarction ([place]) ~ 1970  . Onychia and paronychia of toe   . Osteoarthrosis, unspecified whether generalized or localized, unspecified site 04/27/2007  . Other fall   . Other malaise and fatigue   . Prepatellar bursitis   . Seizures ([place])   . Stroke ([place]) 01/17/2014  . TIA (transient ischemic attack)    "a few one summer" (08/07/2013)  . Type II diabetes mellitus ([place])    "fasting  90-110s" (08/07/2013)  . Unspecified essential hypertension 08/08/2013  . Unspecified vitamin D deficiency     Patient Active Problem List   Diagnosis Date Noted  . SIRS (systemic inflammatory response syndrome) ([place]) 11/26/2017  . Acute pain of right shoulder 10/12/2017  . Adhesive capsulitis of right shoulder 10/12/2017  . Rheumatic nodule 06/28/2017  . History of CHF (congestive heart failure) 02/18/2017  . History of diabetes mellitus 02/18/2017  . History of total knee arthroplasty, bilateral 02/18/2017  . History of rotator cuff surgery 02/18/2017  . Obesity (BMI 30.0-34.9) 12/08/2016  . High risk medications (not anticoagulants) long-term use 09/21/2016  . Hammer toe 10/31/2015  . Acute on chronic diastolic heart failure ([place]) 09/12/2015  . Onychomycosis 04/24/2015  . Midline low back pain without sciatica 06/27/2014  . Bilateral edema of lower extremity 06/05/2014  . CKD (chronic kidney disease) stage 3, GFR 30-59 ml/min (HCC) 06/05/2014  . DM (diabetes mellitus), type 2 with renal complications ([place]) 51/88/4166    Class: Chronic  . Anemia of chronic disease 05/01/2014  . Fluctuating blood pressure 02/06/2014  . Stroke ([place]) 01/17/2014  . Rheumatoid arthritis ([place]) 12/19/2013    Class: Chronic  . CAD (coronary artery disease) 10/18/2013  . HTN (hypertension) 08/08/2013    Class: Chronic  . Fibromyalgia 05/10/2013  . Reactive airway disease 04/19/2013  . Gout 04/19/2013  . Hyperlipidemia 04/27/2007  .  Allergic rhinitis 04/27/2007  . Diverticulosis 04/27/2007    Past Surgical History:  Procedure Laterality Date  . ABDOMINAL HYSTERECTOMY  04/1980  . APPENDECTOMY  1960  . CARDIAC CATHETERIZATION  2003  . CATARACT EXTRACTION W/ INTRAOCULAR LENS  IMPLANT, BILATERAL Bilateral ~ 2012  . JOINT REPLACEMENT    . REPLACEMENT TOTAL KNEE Right 09/2005  . SHOULDER OPEN ROTATOR CUFF REPAIR Right 07/1999  . TONSILLECTOMY  08/1974  . TOTAL KNEE ARTHROPLASTY Left 08/07/2013    Procedure: TOTAL KNEE ARTHROPLASTY- LEFT;  Surgeon: Vickey Huger, MD;  Location: [facility];  Service: Orthopedics;  Laterality: Left;  . TRANSTHORACIC ECHOCARDIOGRAM  2003   EF 55-65%; mild concentric LVH     OB History   None      Home Medications    Prior to Admission medications   Medication Sig Start Date End Date Taking? Authorizing Provider  albuterol (PROVENTIL HFA;VENTOLIN HFA) 108 (90 Base) MCG/ACT inhaler Inhale 1 puff into the lungs every 6 (six) hours as needed for wheezing or shortness of breath.   Yes [provider]  allopurinol (ZYLOPRIM) 300 MG tablet Take 0.5 tablets (150 mg total) by mouth every evening. 11/29/17 02/02/18 Yes Elodia Florence., MD  aspirin EC 81 MG tablet Take 81 mg by mouth daily.   Yes [provider]  calcium-vitamin D (OSCAL WITH D) 500-200 MG-UNIT per tablet Take 1 tablet by mouth daily.    Yes [provider]  CHLOROPHYLL PO Take 5 drops by mouth See admin instructions. MIX INTO 6 OUNCES OF JUICE AND DRINK ONCE A DAY   Yes [provider]  diclofenac sodium (VOLTAREN) 1 % GEL Apply 1 application topically daily.  01/11/18  Yes [provider]  ferrous sulfate 325 (65 FE) MG tablet Take 325 mg by mouth daily.    Yes [provider]  Fluticasone-Salmeterol (ADVAIR DISKUS) 250-50 MCG/DOSE AEPB Inhale 1 puff into the lungs 2 (two) times daily. 05/03/13  Yes [provider]  furosemide (LASIX) 20 MG tablet Take 1 tablet (20 mg total) by mouth every other day. 05/27/17  Yes Nita Sells, MD  gabapentin (NEURONTIN) 100 MG capsule Take 1 capsule (100 mg total) at bedtime by mouth. 09/13/17  Yes Briscoe Deutscher, DO  hydrALAZINE (APRESOLINE) 100 MG tablet take 1 tablet by mouth three times a day 12/30/17  Yes Briscoe Deutscher, DO  hydroxypropyl methylcellulose / hypromellose (ISOPTO TEARS / GONIOVISC) 2.5 % ophthalmic solution Place 1 drop into both eyes 4 (four) times daily as needed for dry eyes.  07/07/17  Yes Briscoe Deutscher, DO  labetalol (NORMODYNE) 100 MG tablet take 2 tablets by mouth twice a day 01/11/18  Yes Briscoe Deutscher, DO  metFORMIN (GLUCOPHAGE) 500 MG tablet Take 1 tablet (500 mg total) by mouth daily with breakfast. 01/28/18  Yes Briscoe Deutscher, DO  methotrexate (RHEUMATREX) 2.5 MG tablet Take 10 mg by mouth once a week.  11/26/17  Yes [provider]  Multiple Vitamins-Minerals (ALIVE ONCE DAILY WOMENS PO) Take 15 mLs by mouth every other day.    Yes [provider]  oxycodone (OXY-IR) 5 MG capsule Take 1 capsule (5 mg total) by mouth every 4 (four) hours as needed for pain. 12/03/17  Yes Briscoe Deutscher, DO  predniSONE (DELTASONE) 10 MG tablet Take 1 tablet (10 mg total) by mouth daily with breakfast. 12/03/17  Yes Briscoe Deutscher, DO  traMADol (ULTRAM) 50 MG tablet Take 1-2 tablets every 6 hours as needed for pain 12/03/17  Yes Juleen China,  [place], DO  vitamin C (ASCORBIC ACID) 500 MG tablet Take 500 mg by mouth every other day.    Yes [provider]  blood glucose meter kit and supplies KIT Dispense based on patient and insurance preference. Use up to four times daily as directed. (FOR ICD-9 250.00, 250.01). 07/30/17   Briscoe Deutscher, DO  erythromycin ophthalmic ointment Use one half inch four times daily to affected eye (s) x 7 days. Patient not taking: Reported on 02/02/2018 01/22/18   Burnard Hawthorne, FNP  glucose blood test strip Test QID dx code E11.9 12/24/17   Briscoe Deutscher, DO  ketoconazole (NIZORAL) 2 % cream Apply 1 application topically 2 (two) times daily. Patient not taking: Reported on 02/02/2018 12/03/16   Robyn Haber, MD  triamcinolone ointment (KENALOG) 0.5 % Apply 1 application 2 (two) times daily topically. Patient not taking: Reported on 02/02/2018 09/13/17   Briscoe Deutscher, DO    Family History Family History  Problem Relation Age of Onset  . Heart attack Father     Social History Social History   Tobacco Use  . Smoking status:  Never Smoker  . Smokeless tobacco: Never Used  Substance Use Topics  . Alcohol use: No    Alcohol/week: 0.0 oz  . Drug use: No  lives at home Uses a walker   Allergies   Clonidine derivatives; Fish allergy; Shellfish allergy; Doxycycline; Indomethacin; Lyrica [pregabalin]; Methyldopa; Orange fruit [citrus]; Cetirizine hcl; Codeine; Levaquin [levofloxacin in d5w]; and Tomato   Review of Systems Review of Systems  All other systems reviewed and are negative.    Physical Exam Updated Vital Signs BP (!) 186/90   Pulse 95   Temp (!) 101.1 F (38.4 C) (Rectal) Comment: informed the nurse  Resp (!) 22   SpO2 99%   Physical Exam  Constitutional: She is oriented to person, place, and time. She appears well-developed and well-nourished.  Non-toxic appearance. She does not appear ill. No distress.  HENT:  Head: Normocephalic and atraumatic.  Right Ear: External ear normal.  Left Ear: External ear normal.  Nose: Nose normal. No mucosal edema or rhinorrhea.  Mouth/Throat: Mucous membranes are dry. No dental abscesses or uvula swelling.  Eyes: Pupils are equal, round, and reactive to light. Conjunctivae and EOM are normal.  Neck: Normal range of motion and full passive range of motion without pain. Neck supple.  Cardiovascular: Normal rate, regular rhythm and normal heart sounds. Exam reveals no gallop and no friction rub.  No murmur heard. Pulmonary/Chest: Effort normal and breath sounds normal. No respiratory distress. She has no wheezes. She has no rhonchi. She has no rales. She exhibits no tenderness and no crepitus.  Abdominal: Soft. Normal appearance and bowel sounds are normal. She exhibits no distension. There is no tenderness. There is no rebound and no guarding.  Musculoskeletal: Normal range of motion. She exhibits no edema or tenderness.  Moves all extremities well.   Neurological: She is alert and oriented to person, place, and time. She has normal strength. No cranial  nerve deficit.  Skin: Skin is warm, dry and intact. No rash noted. No erythema. No pallor.  Patient is noted to have some lesions on her lower legs which the patient and her daughter state are improved.  They are surrounded by hyperpigmented skin with some small superficial ulcers in the center.  There is no increased warmth, swelling, or redness of the skin surrounding these lesions.  Psychiatric: She has a normal mood and affect. Her speech is  normal and behavior is normal. Her mood appears not anxious.  Nursing note and vitals reviewed.   Left lower leg    Right lower leg     ED Treatments / Results  Labs (all labs ordered are listed, but only abnormal results are displayed) Results for orders placed or performed during the hospital encounter of 02/02/18  Comprehensive metabolic panel  Result Value Ref Range   Sodium 134 (L) 135 - 145 mmol/L   Potassium 3.2 (L) 3.5 - 5.1 mmol/L   Chloride 100 (L) 101 - 111 mmol/L   CO2 24 22 - 32 mmol/L   Glucose, Bld 90 65 - 99 mg/dL   BUN 25 (H) 6 - 20 mg/dL   Creatinine, Ser 1.00 0.44 - 1.00 mg/dL   Calcium 8.7 (L) 8.9 - 10.3 mg/dL   Total Protein 6.0 (L) 6.5 - 8.1 g/dL   Albumin 3.3 (L) 3.5 - 5.0 g/dL   AST 17 15 - 41 U/L   ALT 11 (L) 14 - 54 U/L   Alkaline Phosphatase 44 38 - 126 U/L   Total Bilirubin 0.4 0.3 - 1.2 mg/dL   GFR calc non Af Amer 50 (L) >60 mL/min   GFR calc Af Amer 58 (L) >60 mL/min   Anion gap 10 5 - 15  CBC with Differential  Result Value Ref Range   WBC 8.8 4.0 - 10.5 K/uL   RBC 3.56 (L) 3.87 - 5.11 MIL/uL   Hemoglobin 11.4 (L) 12.0 - 15.0 g/dL   HCT 35.2 (L) 36.0 - 46.0 %   MCV 98.9 78.0 - 100.0 fL   MCH 32.0 26.0 - 34.0 pg   MCHC 32.4 30.0 - 36.0 g/dL   RDW 17.5 (H) 11.5 - 15.5 %   Platelets 258 150 - 400 K/uL   Neutrophils Relative % 76 %   Neutro Abs 6.6 1.7 - 7.7 K/uL   Lymphocytes Relative 15 %   Lymphs Abs 1.3 0.7 - 4.0 K/uL   Monocytes Relative 8 %   Monocytes Absolute 0.7 0.1 - 1.0 K/uL    Eosinophils Relative 1 %   Eosinophils Absolute 0.1 0.0 - 0.7 K/uL   Basophils Relative 0 %   Basophils Absolute 0.0 0.0 - 0.1 K/uL  Urinalysis, Routine w reflex microscopic  Result Value Ref Range   Color, Urine STRAW (A) YELLOW   APPearance CLEAR CLEAR   Specific Gravity, Urine 1.008 1.005 - 1.030   pH 5.0 5.0 - 8.0   Glucose, UA NEGATIVE NEGATIVE mg/dL   Hgb urine dipstick NEGATIVE NEGATIVE   Bilirubin Urine NEGATIVE NEGATIVE   Ketones, ur NEGATIVE NEGATIVE mg/dL   Protein, ur NEGATIVE NEGATIVE mg/dL   Nitrite NEGATIVE NEGATIVE   Leukocytes, UA NEGATIVE NEGATIVE  Influenza panel by PCR (type A & B)  Result Value Ref Range   Influenza A By PCR NEGATIVE NEGATIVE   Influenza B By PCR NEGATIVE NEGATIVE  I-Stat CG4 Lactic Acid, ED  Result Value Ref Range   Lactic Acid, Venous 1.21 0.5 - 1.9 mmol/L   Laboratory interpretation all normal except hypokalemia and mild anemia    EKG EKG Interpretation  Date/Time:  Wednesday February 02 2018 02:38:48 EDT Ventricular Rate:  107 PR Interval:    QRS Duration: 84 QT Interval:  335 QTC Calculation: 447 R Axis:   -6 Text Interpretation:  Sinus tachycardia Left ventricular hypertrophy Borderline T abnormalities, lateral leads Anterior ST elevation, probably due to LVH Since last tracing rate faster 26 Nov 2017 Confirmed by  Rolland Porter [phone]) on 02/02/2018 2:57:34 AM   Radiology Dg Chest Port 1 View  Result Date: 02/02/2018 CLINICAL DATA:  84 year old female with fever and weakness. EXAM: PORTABLE CHEST 1 VIEW COMPARISON:  Chest radiograph dated 11/26/2017 FINDINGS: Stable cardiomegaly. No congestion or edema. There is no focal consolidation, pleural effusion, or pneumothorax. Degenerative changes of the shoulders and right acromial pins. No acute osseous pathology. IMPRESSION: No active disease. Cardiomegaly. Electronically Signed   By: Anner Crete [name]   On: 02/02/2018 02:25    Procedures .Critical Care Performed by: Rolland Porter,  MD Authorized by: Rolland Porter, MD   Critical care provider statement:    Critical care time (minutes):  31   Critical care was necessary to treat or prevent imminent or life-threatening deterioration of the following conditions:  Circulatory failure and dehydration   Critical care was time spent personally by me on the following activities:  Discussions with consultants, examination of patient, obtaining history from patient or surrogate, ordering and review of radiographic studies, ordering and review of laboratory studies, pulse oximetry and re-evaluation of patient's condition   (including critical care time)  Medications Ordered in ED Medications  sodium chloride 0.9 % bolus 1,000 mL (0 mLs Intravenous Stopped 02/02/18 0445)    And  sodium chloride 0.9 % bolus 1,000 mL (0 mLs Intravenous Stopped 02/02/18 0446)    And  sodium chloride 0.9 % bolus 1,000 mL (1,000 mLs Intravenous New Bag/Given 02/02/18 0457)  acetaminophen (TYLENOL) tablet 650 mg (650 mg Oral Given 02/02/18 0257)  piperacillin-tazobactam (ZOSYN) IVPB 3.375 g (3.375 g Intravenous New Bag/Given 02/02/18 0456)  vancomycin (VANCOCIN) IVPB 1000 mg/200 mL premix (1,000 mg Intravenous New Bag/Given 02/02/18 0454)     Initial Impression / Assessment and Plan / ED Course  I have reviewed the triage vital signs and the nursing notes.  Pertinent labs & imaging results that were available during my care of the patient were reviewed by me and considered in my medical decision making (see chart for details).     Patient was started on the sepsis protocol without calling code sepsis.  She was given acetaminophen for her fever.  She was given a 3 L bolus of IV fluids.  With the fever and loss of appetite and her tongue being very dry she does appear to be dehydrated.  After reviewing her chest x-ray and her white blood cell count a influenza test was ordered since influenza would be possible.  I talked to the nursing staff around 2 AM  about getting a urine sample.  However at 4:30 AM they still had not gotten the urine.  At this point it was getting close to 2-1/2 hours of not starting antibiotics.  I went ahead and started her on antibiotics for unknown source.  Review of patient's labs shows there is no obvious source for her fever.  Her influenza test is negative, her UA is without infection.  Her chest x-ray does not show pneumonia.  I am going to talk to the hospitalist about admission due to her being on methotrexate she is immune compromised.  5:53 AM Dr. Fuller Plan, hospitalist will admit.  Final Clinical Impressions(s) / ED Diagnoses   Final diagnoses:  Fever, unspecified fever cause  Weakness    Plan admission  Rolland Porter, MD, Barbette Or, MD 02/02/18 [phone]

## 2018-02-02 NOTE — ED Triage Notes (Signed)
Patient presents from home by EMS with generalized weakness that has been slowly increasing for the past 24-48 hours. Patient had a similar episode in January, which ended up being an RA flare up. Patient states feels similar. Per EMS patient has had poor intake and diarrhea, but no N/V. Patient temp per EMS 104.7

## 2018-02-02 NOTE — Progress Notes (Signed)
MEDICATION RELATED CONSULT NOTE - INITIAL   Pharmacy Consult for methotrexate Indication: RA  Allergies  Allergen Reactions  . Clonidine Derivatives Swelling    [name]  . Fish Allergy Diarrhea, Swelling and Other (See Comments)    Turns skin "black," but can tolerate white fish Salmon- Diarrhea  . Shellfish Allergy Hives  . Doxycycline Rash  . Indomethacin     Reaction not recalled by the patient  . Lyrica [Pregabalin]     Hallucinations   . Methyldopa     Aldomet (for hypertension): Reaction not recalled by the patient  . Orange Fruit [Citrus] Other (See Comments)    Indigestion/heartburn  . Cetirizine Hcl Itching and Rash  . Codeine Itching  . Levaquin [Levofloxacin In D5w] Rash  . Tomato Rash    Patient Measurements:   Adjusted Body Weight:   Vital Signs: Temp: 101.1 F (38.4 C) (03/27 0529) Temp Source: Rectal (03/27 0529) BP: 186/90 (03/27 0500) Pulse Rate: 95 (03/27 0500) Intake/Output from previous day: 03/26 0701 - 03/27 0700 In: 2000 [IV Piggyback:2000] Out: 500 [Urine:500] Intake/Output from this shift: Total I/O In: 2000 [IV Piggyback:2000] Out: 500 [Urine:500]  Labs: Recent Labs    02/02/18 0212  WBC 8.8  HGB 11.4*  HCT 35.2*  PLT 258  CREATININE 1.00  ALBUMIN 3.3*  PROT 6.0*  AST 17  ALT 11*  ALKPHOS 44  BILITOT 0.4   Estimated Creatinine Clearance: 43.8 mL/min (by C-G formula based on SCr of 1 mg/dL).   Microbiology: No results found for this or any previous visit (from the past 720 hour(s)).  Medical History: Past Medical History:  Diagnosis Date  . Allergic rhinitis due to pollen 04/27/2007  . Arthritis    "in q joint" (08/07/2013)  . Coronary atherosclerosis of native coronary artery   . Cough   . Disorder of bone and cartilage, unspecified   . Edema 05/03/2013  . Exertional shortness of breath    "sometimes" (08/07/2013)  . First degree  atrioventricular block   . Gout, unspecified 04/19/2013  . Hyperlipidemia 04/27/2007  . Hypertension   . Muscle spasm   . Muscle weakness (generalized)   . Myocardial infarction ([facility]) ~ 1970  . Onychia and paronychia of toe   . Osteoarthrosis, unspecified whether generalized or localized, unspecified site 04/27/2007  . Other fall   . Other malaise and fatigue   . Prepatellar bursitis   . Seizures ([facility])   . Stroke ([facility]) 01/17/2014  . TIA (transient ischemic attack)    "a few one summer" (08/07/2013)  . Type II diabetes mellitus ([facility])    "fasting 90-110s" (08/07/2013)  . Unspecified essential hypertension 08/08/2013  . Unspecified vitamin D deficiency     Medications:   (Not in a hospital admission) Scheduled:  . allopurinol  150 mg Oral QPM  . aspirin EC  81 mg Oral Daily  . enoxaparin (LOVENOX) injection  40 mg Subcutaneous Q24H  . ferrous sulfate  325 mg Oral Daily  . furosemide  20 mg Oral QODAY  . gabapentin  100 mg Oral QHS  . hydrALAZINE  100 mg Oral TID  . insulin aspart  0-9 Units Subcutaneous TID WC  . predniSONE  10 mg Oral Q breakfast    Assessment: Patient taking methotrexate for RA.  Patient has active infection. Methotrexate ([place]rexall; Rheumatrex) hold criteria  Hgb < 8  WBC < 3  Pltc < 100K  SCr > 1.5x baseline (or > 2  if baseline unknown)  AST or ALT >3x ULN  Bili > 1.5x ULN  Ascites or pleural effusion  Diarrhea - Grade 2 or higher  Ulcerative stomatitis  Unexplained pneumonitis / hypoxemia  Active infection   Goal of Therapy:  Safe and effective use of methotrexate  Plan:  D/c methotrexate at this time.  Darlina Guys, Jacquenette Shone [name] 02/02/2018,6:13 AM

## 2018-02-02 NOTE — H&P (Signed)
History and Physical    [name] [name]:[id number] DOB: Aug 16, 1930 DOA: 02/02/2018   Referring MD/NP/PA: Dr. Rolland Porter PCP: Briscoe Deutscher, DO  Patient coming from: Home via EMS  Chief Complaint: Generalized weakness  I have personally briefly reviewed patient's old medical records in [place]   HPI: [name] [name] is a 84 y.o. female with medical history significant of DM type 2, RA on , OA, gout, CAD, and CVA; who presents with complaints of generalized weakness over the last 2 days.  She reports feeling cold and stiff unable to get up and ambulate with her walker like normal.  Associated symptoms include decreased appetite, 5-6 episodes of diarrhea,  fever, and joint pains(chronic).  Denies having any recent sick contacts, shortness of breath, cough, headache, loss of consciousness, falls, abdominal pain, nausea, vomiting, or dysuria.  Patient notes that she had pinkeye last week and was treated with eye ointment that she has completed.  She was last admitted to the hospital 1/18-1/21, complaining of similar symptoms thought to be possibly secondary to lower extremity wounds wounds and possible cellulitis.  Since that hospitalization she is been followed up by wound care and reports that the wounds have been healing appropriately.  ED Course: Upon admission into the emergency department patient was noted to be febrile up to 103 F, heart rate 94-112, respirations 13-22, blood pressure 164/88-188/84, O2 saturations maintained on room air.  Labs revealed WBC 8.8, hemoglobin 11.4, sodium 134, potassium 3.2, BUN 25, creatinine 1, and lactic acid 1.21. Influenza screen negative.  Urinalysis negative.  Chest x-ray showed no active disease in stable cardiomegaly.  Review of Systems  Constitutional: Positive for chills, fever and malaise/fatigue.  HENT: Negative for ear discharge and nosebleeds.   Eyes: Negative for pain and discharge.  Respiratory: Negative for cough, sputum  production and wheezing.   Cardiovascular: Negative for chest pain and leg swelling.  Gastrointestinal: Positive for diarrhea. Negative for abdominal pain, constipation, nausea and vomiting.  Genitourinary: Negative for dysuria and frequency.  Musculoskeletal: Positive for joint pain. Negative for falls.  Skin: Negative for itching and rash.  Neurological: Negative for loss of consciousness and weakness.  Endo/Heme/Allergies: Negative for polydipsia. Does not bruise/bleed easily.  Psychiatric/Behavioral: Negative for memory loss and substance abuse.      Past Medical History:  Diagnosis Date  . Allergic rhinitis due to pollen 04/27/2007  . Arthritis    "in q joint" (08/07/2013)  . Coronary atherosclerosis of native coronary artery   . Cough   . Disorder of bone and cartilage, unspecified   . Edema 05/03/2013  . Exertional shortness of breath    "sometimes" (08/07/2013)  . First degree atrioventricular block   . Gout, unspecified 04/19/2013  . Hyperlipidemia 04/27/2007  . Hypertension   . Muscle spasm   . Muscle weakness (generalized)   . Myocardial infarction ([place]) ~ 1970  . Onychia and paronychia of toe   . Osteoarthrosis, unspecified whether generalized or localized, unspecified site 04/27/2007  . Other fall   . Other malaise and fatigue   . Prepatellar bursitis   . Seizures ([place])   . Stroke ([place]) 01/17/2014  . TIA (transient ischemic attack)    "a few one summer" (08/07/2013)  . Type II diabetes mellitus ([place])    "fasting 90-110s" (08/07/2013)  . Unspecified essential hypertension 08/08/2013  . Unspecified vitamin D deficiency     Past Surgical History:  Procedure Laterality Date  . ABDOMINAL HYSTERECTOMY  04/1980  . APPENDECTOMY  [place]  2003  . CATARACT EXTRACTION W/ INTRAOCULAR LENS  IMPLANT, BILATERAL Bilateral ~ 2012  . JOINT REPLACEMENT    . REPLACEMENT TOTAL KNEE Right 09/2005  . SHOULDER OPEN ROTATOR CUFF REPAIR Right 07/1999  .  TONSILLECTOMY  08/1974  . TOTAL KNEE ARTHROPLASTY Left 08/07/2013   Procedure: TOTAL KNEE ARTHROPLASTY- LEFT;  Surgeon: Vickey Huger, MD;  Location: [facility];  Service: Orthopedics;  Laterality: Left;  . TRANSTHORACIC ECHOCARDIOGRAM  2003   EF 55-65%; mild concentric LVH     reports that she has never smoked. She has never used smokeless tobacco. She reports that she does not drink alcohol or use drugs.  Allergies  Allergen Reactions  . Clonidine Derivatives Swelling    [name]  . Fish Allergy Diarrhea, Swelling and Other (See Comments)    Turns skin "black," but can tolerate white fish Salmon- Diarrhea  . Shellfish Allergy Hives  . Doxycycline Rash  . Indomethacin     Reaction not recalled by the patient  . Lyrica [Pregabalin]     Hallucinations   . Methyldopa     Aldomet (for hypertension): Reaction not recalled by the patient  . Orange Fruit [Citrus] Other (See Comments)    Indigestion/heartburn  . Cetirizine Hcl Itching and Rash  . Codeine Itching  . Levaquin [Levofloxacin In D5w] Rash  . Tomato Rash    Family History  Problem Relation Age of Onset  . Heart attack Father     Prior to Admission medications   Medication Sig Start Date End Date Taking? Authorizing Provider  albuterol (PROVENTIL HFA;VENTOLIN HFA) 108 (90 Base) MCG/ACT inhaler Inhale 1 puff into the lungs every 6 (six) hours as needed for wheezing or shortness of breath.   Yes [provider]  allopurinol (ZYLOPRIM) 300 MG tablet Take 0.5 tablets (150 mg total) by mouth every evening. 11/29/17 02/02/18 Yes Elodia Florence., MD  aspirin EC 81 MG tablet Take 81 mg by mouth daily.   Yes [provider]  calcium-vitamin D (OSCAL WITH D) 500-200 MG-UNIT per tablet Take 1 tablet by mouth daily.    Yes [provider]  CHLOROPHYLL PO Take 5 drops by mouth See admin instructions. MIX INTO 6 OUNCES OF JUICE AND DRINK  ONCE A DAY   Yes [provider]  diclofenac sodium (VOLTAREN) 1 % GEL Apply 1 application topically daily.  01/11/18  Yes [provider]  ferrous sulfate 325 (65 FE) MG tablet Take 325 mg by mouth daily.    Yes [provider]  Fluticasone-Salmeterol (ADVAIR DISKUS) 250-50 MCG/DOSE AEPB Inhale 1 puff into the lungs 2 (two) times daily. 05/03/13  Yes [provider]  furosemide (LASIX) 20 MG tablet Take 1 tablet (20 mg total) by mouth every other day. 05/27/17  Yes Nita Sells, MD  gabapentin (NEURONTIN) 100 MG capsule Take 1 capsule (100 mg total) at bedtime by mouth. 09/13/17  Yes Briscoe Deutscher, DO  hydrALAZINE (APRESOLINE) 100 MG tablet take 1 tablet by mouth three times a day 12/30/17  Yes Briscoe Deutscher, DO  hydroxypropyl methylcellulose / hypromellose (ISOPTO TEARS / GONIOVISC) 2.5 % ophthalmic solution Place 1 drop into both eyes 4 (four) times daily as needed for dry eyes. 07/07/17  Yes Briscoe Deutscher, DO  labetalol (NORMODYNE) 100 MG tablet take 2 tablets by mouth twice a day 01/11/18  Yes Briscoe Deutscher, DO  metFORMIN (GLUCOPHAGE) 500 MG tablet Take 1 tablet (  500 mg total) by mouth daily with breakfast. 01/28/18  Yes Briscoe Deutscher, DO  methotrexate (RHEUMATREX) 2.5 MG tablet Take 10 mg by mouth once a week.  11/26/17  Yes [provider]  Multiple Vitamins-Minerals (ALIVE ONCE DAILY WOMENS PO) Take 15 mLs by mouth every other day.    Yes [provider]  oxycodone (OXY-IR) 5 MG capsule Take 1 capsule (5 mg total) by mouth every 4 (four) hours as needed for pain. 12/03/17  Yes Briscoe Deutscher, DO  predniSONE (DELTASONE) 10 MG tablet Take 1 tablet (10 mg total) by mouth daily with breakfast. 12/03/17  Yes Briscoe Deutscher, DO  traMADol (ULTRAM) 50 MG tablet Take 1-2 tablets every 6 hours as needed for pain 12/03/17  Yes Briscoe Deutscher, DO  vitamin C (ASCORBIC ACID) 500 MG tablet Take 500 mg by mouth every other day.    Yes [provider]  blood glucose meter kit and supplies KIT Dispense based on patient and insurance preference. Use up to four times daily as directed. (FOR ICD-9 250.00, 250.01). 07/30/17   Briscoe Deutscher, DO  erythromycin ophthalmic ointment Use one half inch four times daily to affected eye (s) x 7 days. Patient not taking: Reported on 02/02/2018 01/22/18   Burnard Hawthorne, FNP  glucose blood test strip Test QID dx code E11.9 12/24/17   Briscoe Deutscher, DO  ketoconazole (NIZORAL) 2 % cream Apply 1 application topically 2 (two) times daily. Patient not taking: Reported on 02/02/2018 12/03/16   Robyn Haber, MD  triamcinolone ointment (KENALOG) 0.5 % Apply 1 application 2 (two) times daily topically. Patient not taking: Reported on 02/02/2018 09/13/17   Briscoe Deutscher, DO    Physical Exam:  Constitutional: Elderly female who appears to be acutely sick, but nontoxic Vitals:   02/02/18 0510 02/02/18 0529 02/02/18 0530 02/02/18 0630  BP:   (!) 173/83 (!) 173/83  Pulse:   91 94  Resp:   19 17  Temp: (!) 100.8 F (38.2 C) (!) 101.1 F (38.4 C)    TempSrc:  Rectal    SpO2:   99% 99%   Eyes: PERRL, lids and conjunctivae normal ENMT: Mucous membranes are dry.Posterior pharynx clear of any exudate or lesions.  Neck: normal, supple, no masses, no thyromegaly Respiratory: clear to auscultation bilaterally, no wheezing, no crackles. Normal respiratory effort. No accessory muscle use.  Cardiovascular: Regular rate and rhythm, no murmurs / rubs / gallops. No extremity edema. 2+ pedal pulses. No carotid bruits.  Abdomen: no tenderness, no masses palpated. No hepatosplenomegaly. Bowel sounds positive.  Musculoskeletal: no clubbing / cyanosis. No joint deformity upper and lower extremities. Good ROM, no contractures. Normal muscle tone.  Skin: Previously noted ulcerations appear to be healing. Neurologic: CN 2-12 grossly intact. Sensation intact, DTR normal. Strength 5/5 in all 4.  Psychiatric: Normal judgment and  insight. Alert and oriented x 3. Normal mood.       Labs on Admission: I have personally reviewed following labs and imaging studies  CBC: Recent Labs  Lab 02/02/18 0212  WBC 8.8  NEUTROABS 6.6  HGB 11.4*  HCT 35.2*  MCV 98.9  PLT 292   Basic Metabolic Panel: Recent Labs  Lab 02/02/18 0212  NA 134*  K 3.2*  CL 100*  CO2 24  GLUCOSE 90  BUN 25*  CREATININE 1.00  CALCIUM 8.7*   GFR: Estimated Creatinine Clearance: 43.8 mL/min (by C-G formula based on SCr of 1 mg/dL). Liver Function Tests: Recent Labs  Lab 02/02/18 [phone]  AST 17  ALT 11*  ALKPHOS 44  BILITOT 0.4  PROT 6.0*  ALBUMIN 3.3*   No results for input(s): LIPASE, AMYLASE in the last 168 hours. No results for input(s): AMMONIA in the last 168 hours. Coagulation Profile: No results for input(s): INR, PROTIME in the last 168 hours. Cardiac Enzymes: No results for input(s): CKTOTAL, CKMB, CKMBINDEX, TROPONINI in the last 168 hours. BNP (last 3 results) No results for input(s): PROBNP in the last 8760 hours. HbA1C: No results for input(s): HGBA1C in the last 72 hours. CBG: No results for input(s): GLUCAP in the last 168 hours. Lipid Profile: No results for input(s): CHOL, HDL, LDLCALC, TRIG, CHOLHDL, LDLDIRECT in the last 72 hours. Thyroid Function Tests: No results for input(s): TSH, T4TOTAL, FREET4, T3FREE, THYROIDAB in the last 72 hours. Anemia Panel: No results for input(s): VITAMINB12, FOLATE, FERRITIN, TIBC, IRON, RETICCTPCT in the last 72 hours. Urine analysis:    Component Value Date/Time   COLORURINE STRAW (A) 02/02/2018 0448   APPEARANCEUR CLEAR 02/02/2018 0448   LABSPEC 1.008 02/02/2018 0448   PHURINE 5.0 02/02/2018 0448   GLUCOSEU NEGATIVE 02/02/2018 0448   HGBUR NEGATIVE 02/02/2018 0448   BILIRUBINUR NEGATIVE 02/02/2018 0448   BILIRUBINUR Negative 01/13/2017 1234   KETONESUR NEGATIVE 02/02/2018 0448   PROTEINUR NEGATIVE 02/02/2018 0448   UROBILINOGEN 0.2 01/13/2017 1234    UROBILINOGEN 1.0 09/11/2015 0135   NITRITE NEGATIVE 02/02/2018 0448   LEUKOCYTESUR NEGATIVE 02/02/2018 0448   Sepsis Labs: No results found for this or any previous visit (from the past 240 hour(s)).   Radiological Exams on Admission: Dg Chest Port 1 View  Result Date: 02/02/2018 CLINICAL DATA:  84 year old female with fever and weakness. EXAM: PORTABLE CHEST 1 VIEW COMPARISON:  Chest radiograph dated 11/26/2017 FINDINGS: Stable cardiomegaly. No congestion or edema. There is no focal consolidation, pleural effusion, or pneumothorax. Degenerative changes of the shoulders and right acromial pins. No acute osseous pathology. IMPRESSION: No active disease. Cardiomegaly. Electronically Signed   By: Anner Crete [name]   On: 02/02/2018 02:25     EKG: Independently reviewed.  Sinus tachycardia at 107 bpm  Assessment/Plan SIRS: Acute.  Patient presents with a fever up to 103 F, tachycardia, and tachypnea.  WBC noted to be 8.8 with reassuring lactic acid of 1.21.  Influenza screen negative.  Urinalysis and chest x-ray showed no clear signs of infection.  Given patient is immunocompromise question underlying infection.  Patient started on sepsis protocol with full 3 L fluid bolus.  Symptoms could be 2/2 viral etiology vs flare patient's rheumatoid arthritis. - Admit to telemetry bed - Follow-up blood cultures - Add on respiratory virus panel - Add on ESR and CRP  - Continue empiric antibiotics of vancomycin and Zosyn  Diarrhea: Acute.  Patient reports having 4-5 episodes of diarrhea prior to arrival.  Last reports having antibiotics 2 months ago. - Check GI panel and C. Difficile  Rheumatoid arthritis: Patient noted to have healing lower extremity wounds. - Follow-up ESR  - Continue prednisone, methotrexate - Wound care consult   Gout: Patient does not appear to be having an acute flare  - Add on uric acid level - Continue allopurinol  Hypokalemia: Acute.  Initial potassium noted by  3.2 on admission. - Give 40 mEq of potassium chloride x1 dose now - Continue to monitor and replace as needed  Diastolic CHF: She does not appear to be fluid overloaded at this time.  Last echocardiogram showing EF of 66 5% with grade 1 diastolic  dysfunction back in 01/2014. - Continue to monitor  Reactive airway disease - Continue pharmacy substitution of Dulera  Normocytic normochromic anemia: Hemoglobin noted to be 11.4 on admission. - Continue ferrous sulfate  Essential hypertension  - Continue hydralazine   Diabetes mellitus type 2: Well controlled.  [name] last hemoglobin A1c noted to be 5.4 on 11/26/2017. - Hypoglycemic protocols - Hold metformin - CBGs q. before meals with sensitive SSI  DVT prophylaxis: Lovenox Code Status: DNR Family Communication: None Disposition Plan: TBD  Consults called: None Admission status: Inpatient  Norval Morton MD [facility] Pager [phone]   If 7PM-7AM, please contact night-coverage [email or web address] Password [facility]  02/02/2018, 5:46 AM

## 2018-02-03 LAB — BASIC METABOLIC PANEL
Anion gap: 8 (ref 5–15)
BUN: 17 mg/dL (ref 6–20)
CO2: 22 mmol/L (ref 22–32)
Calcium: 8.1 mg/dL — ABNORMAL LOW (ref 8.9–10.3)
Chloride: 111 mmol/L (ref 101–111)
Creatinine, Ser: 1.04 mg/dL — ABNORMAL HIGH (ref 0.44–1.00)
GFR calc Af Amer: 55 mL/min — ABNORMAL LOW (ref >60)
GFR calc non Af Amer: 48 mL/min — ABNORMAL LOW (ref >60)
Glucose, Bld: 156 mg/dL — ABNORMAL HIGH (ref 65–99)
Potassium: 3.2 mmol/L — ABNORMAL LOW (ref 3.5–5.1)
Sodium: 141 mmol/L (ref 135–145)

## 2018-02-03 LAB — GASTROINTESTINAL PANEL BY PCR, STOOL (REPLACES STOOL CULTURE)

## 2018-02-03 LAB — CBC
HCT: 33.2 % — ABNORMAL LOW (ref 36.0–46.0)
Hemoglobin: 10.9 g/dL — ABNORMAL LOW (ref 12.0–15.0)
MCH: 32.4 pg (ref 26.0–34.0)
MCHC: 32.8 g/dL (ref 30.0–36.0)
MCV: 98.8 fL (ref 78.0–100.0)
Platelets: 243 10*3/uL (ref 150–400)
RBC: 3.36 MIL/uL — ABNORMAL LOW (ref 3.87–5.11)
RDW: 17.9 % — ABNORMAL HIGH (ref 11.5–15.5)
WBC: 10.1 10*3/uL (ref 4.0–10.5)

## 2018-02-03 LAB — GLUCOSE, CAPILLARY
Glucose-Capillary: 160 mg/dL — ABNORMAL HIGH (ref 65–99)
Glucose-Capillary: 135 mg/dL — ABNORMAL HIGH (ref 65–99)
Glucose-Capillary: 182 mg/dL — ABNORMAL HIGH (ref 65–99)
Glucose-Capillary: 125 mg/dL — ABNORMAL HIGH (ref 65–99)

## 2018-02-03 LAB — URINE CULTURE
Culture: NO GROWTH
Special Requests: NORMAL

## 2018-02-03 MED ORDER — METHOTREXATE 2.5 MG PO TABS: 10.0000 mg | ORAL_TABLET | ORAL | Status: DC

## 2018-02-03 MED ORDER — POTASSIUM CHLORIDE CRYS ER 20 MEQ PO TBCR
40.0000 meq | EXTENDED_RELEASE_TABLET | ORAL | Status: AC
  Administered 2018-02-03 (×2): 40 meq via ORAL
  Filled 2018-02-03 (×2): qty 2

## 2018-02-03 NOTE — Progress Notes (Addendum)
Patient is requesting Methotrexate.Patient takes this dose on Thursdays. PCP was notified.

## 2018-02-03 NOTE — Progress Notes (Signed)
PROGRESS NOTE    FRED HAMMES  ZJI:[id number] DOB: Nov 20, 1929 DOA: 02/02/2018 PCP: Helane Rima, DO    Brief Narrative:  84 year old female who presented with generalized weakness.  She does have a significant past medical history of type 2 diabetes mellitus, RA/OA, coronary disease, chronic lower extremity wounds, and history of CVA.  Patient complains of generalized weakness for the last 2 days prior to hospitalization. She felt stiff, unable to ambulate, decreased appetite, diarrhea, and fevers.  On initial physical examination blood pressure 173/83, heart rate 91, respiration 19, temperature 101.1, oxygen saturation 99%, dry mucous membranes, lungs clear to auscultation bilaterally, heart sounds S1, S2 present rhythmic, abdomen soft nontender, no lower extremity edema.  Sodium 134, potassium 3.2, chloride 100, bicarb 24, glucose 90, BUN 25, creatinine 1.0, white count 8.8, hemoglobin 9.4, hematocrit 35.2, platelets 258, urinalysis negative for infection.  Chest x-ray, right rotation, hyperinflation, no infiltrates.  EKG sinus tachycardia 107 bpm, normal axis, normal intervals.  Patient was admitted to the hospital with febrile syndrome, rule out sepsis.  Assessment & Plan:   Principal Problem:   SIRS (systemic inflammatory response syndrome) (HCC) Active Problems:   Reactive airway disease   Gout   HTN (hypertension)   Rheumatoid arthritis (HCC)   DM (diabetes mellitus), type 2 with renal complications (HCC)   Diarrhea   Hypokalemia    1. Febrile viral syndrome. Patient had fever spike this am at 100.6, continue to hold on antibiotic therapy, still very high pretest probability of viral syndrome, will continue to hold on antibiotic therapy, follow temperature curve and continue supportive medical therapy. Continue to hold on furosemide for now.   2. HTN. Continue with hydralazine 100 mg tid. Systolic blood pressure 147 mmHg.  3. T2DM. Continue glucose cover and monitoring  with insulin sliding scale, capillary glucose 96, 100, 105, 160, 135. Patient tolerating po well.   4. Hypokalemia. Serum K at 3,2 will continue correction with kcl po and follow on renal panel in am, suspected k loss due to diarrhea. Preserved renal function with serum cr at 1.04 and serum bicarbonate at 22. Hold on furosemide.   5. Gout/ RA. No clinical signs of acute gout flare, usually affecting right foot. Continue allopurinol. continue home dose of prednisone, patient on methotrexate at home once weekly. Medical induced immunosuppression.    6. Iron deficiency. Will continue fe sulfate.    DVT prophylaxis: enoxaparin  Code Status: dnr Family Communication: I spoke with patient's family at the bedside and all questions were addressed.  Disposition Plan: home if afebrile in 24 hours.   Consultants:    Procedures:    Antimicrobials:      Subjective: Patient had fever spike this am, no nausea or vomiting, improved diarrhea, no chest pain or dyspnea. Patient has been out of bed with physical therapy.   Objective: Vitals:   02/02/18 1755 02/02/18 2029 02/03/18 0358 02/03/18 0931  BP: (!) 147/90 135/70 (!) 148/83 (!) 177/71  Pulse: (!) 105 (!) 106 (!) 122 (!) 111  Resp: (!) 21 20 20    Temp: 98.8 F (37.1 C) 98.5 F (36.9 C) (!) 100.6 F (38.1 C)   TempSrc: Oral Oral Oral   SpO2: 99% 99% 100%   Weight: 86.2 kg (190 lb 0.6 oz)  86.2 kg (190 lb 0.6 oz)   Height: 5\' 4"  (1.626 m)       Intake/Output Summary (Last 24 hours) at 02/03/2018 1108 Last data filed at 02/03/2018 0600 Gross per 24 hour  Intake  799.17 ml  Output 200 ml  Net 599.17 ml   Filed Weights   02/02/18 1755 02/03/18 0358  Weight: 86.2 kg (190 lb 0.6 oz) 86.2 kg (190 lb 0.6 oz)    Examination:   General: Not in pain or dyspnea, deconditioned Neurology: Awake and alert, non focal  E ENT: mild pallor, no icterus, oral mucosa moist Cardiovascular: No JVD. S1-S2 present, rhythmic, no  gallops, rubs, or murmurs. No lower extremity edema. Pulmonary: vesicular breath sounds bilaterally, adequate air movement, no wheezing, rhonchi or rales. Gastrointestinal. Abdomen protuberant, no organomegaly, non tender, no rebound or guarding Skin. No rashes Musculoskeletal: no joint deformities     Data Reviewed: I have personally reviewed following labs and imaging studies  CBC: Recent Labs  Lab 02/02/18 0212 02/03/18 0352  WBC 8.8 10.1  NEUTROABS 6.6  --   HGB 11.4* 10.9*  HCT 35.2* 33.2*  MCV 98.9 98.8  PLT 258 243   Basic Metabolic Panel: Recent Labs  Lab 02/02/18 0212 02/03/18 0352  NA 134* 141  K 3.2* 3.2*  CL 100* 111  CO2 24 22  GLUCOSE 90 156*  BUN 25* 17  CREATININE 1.00 1.04*  CALCIUM 8.7* 8.1*   GFR: Estimated Creatinine Clearance: 42 mL/min (A) (by C-G formula based on SCr of 1.04 mg/dL (H)). Liver Function Tests: Recent Labs  Lab 02/02/18 0212  AST 17  ALT 11*  ALKPHOS 44  BILITOT 0.4  PROT 6.0*  ALBUMIN 3.3*   No results for input(s): LIPASE, AMYLASE in the last 168 hours. No results for input(s): AMMONIA in the last 168 hours. Coagulation Profile: No results for input(s): INR, PROTIME in the last 168 hours. Cardiac Enzymes: No results for input(s): CKTOTAL, CKMB, CKMBINDEX, TROPONINI in the last 168 hours. BNP (last 3 results) No results for input(s): PROBNP in the last 8760 hours. HbA1C: No results for input(s): HGBA1C in the last 72 hours. CBG: Recent Labs  Lab 02/02/18 1222 02/02/18 1710 02/02/18 1839 02/02/18 2032 02/03/18 0817  GLUCAP 96 96 100* 105* 160*   Lipid Profile: No results for input(s): CHOL, HDL, LDLCALC, TRIG, CHOLHDL, LDLDIRECT in the last 72 hours. Thyroid Function Tests: No results for input(s): TSH, T4TOTAL, FREET4, T3FREE, THYROIDAB in the last 72 hours. Anemia Panel: No results for input(s): VITAMINB12, FOLATE, FERRITIN, TIBC, IRON, RETICCTPCT in the last 72 hours.    Radiology Studies: I have  reviewed all of the imaging during this hospital visit personally     Scheduled Meds: . allopurinol  150 mg Oral QPM  . aspirin EC  81 mg Oral Daily  . collagenase   Topical Daily  . enoxaparin (LOVENOX) injection  40 mg Subcutaneous Q24H  . ferrous sulfate  325 mg Oral Daily  . gabapentin  100 mg Oral QHS  . hydrALAZINE  100 mg Oral TID  . insulin aspart  0-9 Units Subcutaneous TID WC  . mometasone-formoterol  2 puff Inhalation BID  . predniSONE  10 mg Oral Q breakfast   Continuous Infusions: . sodium chloride 50 mL/hr at 02/02/18 1849     LOS: 1 day        Coralie Keens, MD [facility] Pager [phone]

## 2018-02-03 NOTE — Progress Notes (Signed)
MEDICATION RELATED CONSULT NOTE - INITIAL   Pharmacy Consult for Methotrexate Indication: RA  Allergies  Allergen Reactions  . Clonidine Derivatives Swelling    [name]  . Fish Allergy Diarrhea, Swelling and Other (See Comments)    Turns skin "black," but can tolerate white fish Salmon- Diarrhea  . Shellfish Allergy Hives  . Doxycycline Rash  . Indomethacin     Reaction not recalled by the patient  . Lyrica [Pregabalin]     Hallucinations   . Methyldopa     Aldomet (for hypertension): Reaction not recalled by the patient  . Orange Fruit [Citrus] Other (See Comments)    Indigestion/heartburn  . Cetirizine Hcl Itching and Rash  . Codeine Itching  . Levaquin [Levofloxacin In D5w] Rash  . Tomato Rash    Patient Measurements: Height: 5' 4" (162.6 cm) Weight: 190 lb 0.6 oz (86.2 kg) IBW/kg (Calculated) : 54.7 Adjusted Body Weight:   Vital Signs: Temp: 97.9 F (36.6 C) (03/28 1424) Temp Source: Oral (03/28 1424) BP: 181/75 (03/28 1748) Intake/Output from previous day: 03/27 0701 - 03/28 0700 In: 799.2 [P.O.:240; I.V.:559.2] Out: 200 [Urine:200] Intake/Output from this shift: No intake/output data recorded.  Labs: Recent Labs    02/02/18 0212 02/03/18 0352  WBC 8.8 10.1  HGB 11.4* 10.9*  HCT 35.2* 33.2*  PLT 258 243  CREATININE 1.00 1.04*  ALBUMIN 3.3*  --   PROT 6.0*  --   AST 17  --   ALT 11*  --   ALKPHOS 44  --   BILITOT 0.4  --    Estimated Creatinine Clearance: 42 mL/min (A) (by C-G formula based on SCr of 1.04 mg/dL (H)).   Microbiology: Recent Results (from the past 720 hour(s))  Culture, blood (Routine x 2)     Status: None (Preliminary result)   Collection Time: 02/02/18  2:12 AM  Result Value Ref Range Status   Specimen Description   Final    BLOOD LEFT FOREARM Performed at [facility] [address] [place], [place] [address]    Special  Requests   Final    BOTTLES DRAWN AEROBIC AND ANAEROBIC Blood Culture adequate volume Performed at [facility] [address] [place], [place] [address]    Culture   Final    NO GROWTH 1 DAY Performed at [facility], [place] [address], [place], [place] [address]    Report Status PENDING  Incomplete  Culture, blood (Routine x 2)     Status: None (Preliminary result)   Collection Time: 02/02/18  2:12 AM  Result Value Ref Range Status   Specimen Description   Final    BLOOD LEFT HAND Performed at [facility] [address], [place], [place] [address]    Special Requests   Final    BOTTLES DRAWN AEROBIC AND ANAEROBIC Blood Culture results may not be optimal due to an inadequate volume of blood received in culture bottles Performed at [facility] [address] [place], [place] [address]    Culture   Final    NO GROWTH 1 DAY Performed at [facility], [place] [address], [place], [place] [address]    Report Status PENDING  Incomplete  Respiratory Panel by PCR     Status: None   Collection Time: 02/02/18  4:00 AM  Result Value Ref Range Status   Adenovirus NOT DETECTED NOT DETECTED Final   Coronavirus  229E NOT DETECTED NOT DETECTED Final   Coronavirus HKU1 NOT DETECTED NOT DETECTED Final   Coronavirus NL63 NOT DETECTED NOT DETECTED Final   Coronavirus OC43 NOT DETECTED NOT DETECTED Final   Metapneumovirus NOT DETECTED NOT DETECTED Final   Rhinovirus / Enterovirus NOT DETECTED NOT DETECTED Final   Influenza A NOT DETECTED NOT DETECTED Final   Influenza B NOT DETECTED NOT DETECTED Final   Parainfluenza Virus 1 NOT DETECTED NOT DETECTED Final   Parainfluenza Virus 2 NOT DETECTED NOT DETECTED Final   Parainfluenza Virus 3 NOT DETECTED NOT DETECTED Final   Parainfluenza Virus 4 NOT DETECTED NOT DETECTED Final   Respiratory Syncytial Virus NOT DETECTED NOT DETECTED Final   Bordetella pertussis NOT DETECTED NOT  DETECTED Final   Chlamydophila pneumoniae NOT DETECTED NOT DETECTED Final   Mycoplasma pneumoniae NOT DETECTED NOT DETECTED Final    Comment: Performed at [facility], [place] [address] [place], [place] [address]  Urine culture     Status: None   Collection Time: 02/02/18  4:48 AM  Result Value Ref Range Status   Specimen Description   Final    URINE, CATHETERIZED Performed at [facility] [address] [place], [place] [address]    Special Requests   Final    Normal Performed at [facility], [place] [address], [place], [place] [address]    Culture   Final    NO GROWTH Performed at [facility], [place] [address], [place], [place] [address]    Report Status 02/03/2018 FINAL  Final  Gastrointestinal Panel by PCR , Stool     Status: None   Collection Time: 02/02/18 12:21 PM  Result Value Ref Range Status   Campylobacter species NOT DETECTED NOT DETECTED Final   Plesimonas shigelloides NOT DETECTED NOT DETECTED Final   Salmonella species NOT DETECTED NOT DETECTED Final   Yersinia enterocolitica NOT DETECTED NOT DETECTED Final   Vibrio species NOT DETECTED NOT DETECTED Final   Vibrio cholerae NOT DETECTED NOT DETECTED Final   Enteroaggregative E coli (EAEC) NOT DETECTED NOT DETECTED Final   Enteropathogenic E coli (EPEC) NOT DETECTED NOT DETECTED Final   Enterotoxigenic E coli (ETEC) NOT DETECTED NOT DETECTED Final   Shiga like toxin producing E coli (STEC) NOT DETECTED NOT DETECTED Final   Shigella/Enteroinvasive E coli (EIEC) NOT DETECTED NOT DETECTED Final   Cryptosporidium NOT DETECTED NOT DETECTED Final   Cyclospora cayetanensis NOT DETECTED NOT DETECTED Final   Entamoeba histolytica NOT DETECTED NOT DETECTED Final   Giardia lamblia NOT DETECTED NOT DETECTED Final   Adenovirus F40/41 NOT DETECTED NOT DETECTED Final   Astrovirus NOT DETECTED NOT DETECTED Final   Norovirus GI/GII NOT DETECTED NOT DETECTED Final   Rotavirus A NOT  DETECTED NOT DETECTED Final   Sapovirus (I, II, IV, and V) NOT DETECTED NOT DETECTED Final    Comment: Performed at [facility], [address] [place], [place] [address]  C difficile quick scan w PCR reflex     Status: None   Collection Time: 02/02/18 12:21 PM  Result Value Ref Range Status   C Diff antigen NEGATIVE NEGATIVE Final   C Diff toxin NEGATIVE NEGATIVE Final   C Diff interpretation No C. difficile detected.  Final    Comment: Performed at [facility], [place] [address] [place], [place] [address]    Medical History: Past Medical History:  Diagnosis Date  . Allergic rhinitis due to pollen 04/27/2007  .  Arthritis    "in q joint" (08/07/2013)  . Coronary atherosclerosis of native coronary artery   . Cough   . Disorder of bone and cartilage, unspecified   . Edema 05/03/2013  . Exertional shortness of breath    "sometimes" (08/07/2013)  . First degree atrioventricular block   . Gout, unspecified 04/19/2013  . Hyperlipidemia 04/27/2007  . Hypertension   . Muscle spasm   . Muscle weakness (generalized)   . Myocardial infarction ([facility]) ~ 1970  . Onychia and paronychia of toe   . Osteoarthrosis, unspecified whether generalized or localized, unspecified site 04/27/2007  . Other fall   . Other malaise and fatigue   . Prepatellar bursitis   . Seizures ([facility])   . Stroke ([place]) 01/17/2014  . TIA (transient ischemic attack)    "a few one summer" (08/07/2013)  . Type II diabetes mellitus ([facility])    "fasting 90-110s" (08/07/2013)  . Unspecified essential hypertension 08/08/2013  . Unspecified vitamin D deficiency     Medications:  Medications Prior to Admission  Medication Sig Dispense Refill Last Dose  . albuterol (PROVENTIL HFA;VENTOLIN HFA) 108 (90 Base) MCG/ACT inhaler Inhale 1 puff into the lungs every 6 (six) hours as needed for wheezing or shortness of breath.   Past Month at Unknown time  . allopurinol (ZYLOPRIM) 300 MG tablet Take 0.5 tablets  (150 mg total) by mouth every evening. 15 tablet 0 Past Week at Unknown time  . aspirin EC 81 MG tablet Take 81 mg by mouth daily.   02/01/2018 at Unknown time  . calcium-vitamin D (OSCAL WITH D) 500-200 MG-UNIT per tablet Take 1 tablet by mouth daily.    02/01/2018 at Unknown time  . CHLOROPHYLL PO Take 5 drops by mouth See admin instructions. MIX INTO 6 OUNCES OF JUICE AND DRINK ONCE A DAY   02/01/2018 at Unknown time  . diclofenac sodium (VOLTAREN) 1 % GEL Apply 1 application topically daily.   0 Past Week at Unknown time  . ferrous sulfate 325 (65 FE) MG tablet Take 325 mg by mouth daily.    02/01/2018 at Unknown time  . Fluticasone-Salmeterol (ADVAIR DISKUS) 250-50 MCG/DOSE AEPB Inhale 1 puff into the lungs 2 (two) times daily.   02/01/2018 at Unknown time  . furosemide (LASIX) 20 MG tablet Take 1 tablet (20 mg total) by mouth every other day. 30 tablet 5 02/01/2018 at Unknown time  . gabapentin (NEURONTIN) 100 MG capsule Take 1 capsule (100 mg total) at bedtime by mouth. 30 capsule 3 Past Week at Unknown time  . hydrALAZINE (APRESOLINE) 100 MG tablet take 1 tablet by mouth three times a day 90 tablet 1 02/01/2018 at Unknown time  . hydroxypropyl methylcellulose / hypromellose (ISOPTO TEARS / GONIOVISC) 2.5 % ophthalmic solution Place 1 drop into both eyes 4 (four) times daily as needed for dry eyes. 15 mL 2 Past Week at Unknown time  . labetalol (NORMODYNE) 100 MG tablet take 2 tablets by mouth twice a day 120 tablet 1 02/01/2018 at 0900  . metFORMIN (GLUCOPHAGE) 500 MG tablet Take 1 tablet (500 mg total) by mouth daily with breakfast. 90 tablet 2 02/01/2018 at Unknown time  . methotrexate (RHEUMATREX) 2.5 MG tablet Take 10 mg by mouth once a week.    Past Week at Unknown time  . Multiple Vitamins-Minerals (ALIVE ONCE DAILY WOMENS PO) Take 15 mLs by mouth every other day.    02/01/2018 at Unknown time  . oxycodone (OXY-IR) 5 MG capsule Take 1 capsule (5  mg total) by mouth every 4 (four) hours as needed  for pain. 30 capsule 0 Past Month at Unknown time  . predniSONE (DELTASONE) 10 MG tablet Take 1 tablet (10 mg total) by mouth daily with breakfast. 14 tablet 0 02/01/2018 at Unknown time  . traMADol (ULTRAM) 50 MG tablet Take 1-2 tablets every 6 hours as needed for pain 240 tablet 5 02/01/2018 at Unknown time  . vitamin C (ASCORBIC ACID) 500 MG tablet Take 500 mg by mouth every other day.    02/01/2018 at Unknown time  . blood glucose meter kit and supplies KIT Dispense based on patient and insurance preference. Use up to four times daily as directed. (FOR ICD-9 250.00, 250.01). 1 each 0 Taking  . erythromycin ophthalmic ointment Use one half inch four times daily to affected eye (s) x 7 days. (Patient not taking: Reported on 02/02/2018) 3.5 g 0 Not Taking at Unknown time  . glucose blood test strip Test QID dx code E11.9 100 each 6 Taking  . ketoconazole (NIZORAL) 2 % cream Apply 1 application topically 2 (two) times daily. (Patient not taking: Reported on 02/02/2018) 30 g 0 Not Taking at Unknown time  . triamcinolone ointment (KENALOG) 0.5 % Apply 1 application 2 (two) times daily topically. (Patient not taking: Reported on 02/02/2018) 15 g 3 Not Taking at Unknown time   Scheduled:  . allopurinol  150 mg Oral QPM  . aspirin EC  81 mg Oral Daily  . collagenase   Topical Daily  . enoxaparin (LOVENOX) injection  40 mg Subcutaneous Q24H  . ferrous sulfate  325 mg Oral Daily  . gabapentin  100 mg Oral QHS  . hydrALAZINE  100 mg Oral TID  . insulin aspart  0-9 Units Subcutaneous TID WC  . methotrexate  10 mg Oral Weekly  . mometasone-formoterol  2 puff Inhalation BID  . predniSONE  10 mg Oral Q breakfast    Assessment: Patient with methotrexate use for RA.  Patient with viral infection, fever (101.1 per MD note) and prior antibiotics use.    Goal of Therapy:  Safe and effective use of methotrexate  Plan:  Continue to hold methotrexate, inform RN to detail patient (done).  Nani Skillern  [name] 02/03/2018,10:14 PM

## 2018-02-03 NOTE — Evaluation (Signed)
Physical Therapy Evaluation Patient Details Name: [name] [name] MRN: [id number] DOB: 28-Jun-1930 Today's Date: 02/03/2018   History of Present Illness  84 y.o. female with medical history significant of DM type 2, RA on , OA, gout, CAD, and CVA.  She presented with complaints of generalized weakness over 2 days. Pt admitted with SIRS.      Clinical Impression  Pt admitted with above diagnosis. Pt currently with functional limitations due to the deficits listed below (see PT Problem List). On eval, pt required min assist bed mobility, min assist transfers and min guard assist ambulation 5 feet with RW.  Pt will benefit from skilled PT to increase their independence and safety with mobility to allow discharge to the venue listed below.       Follow Up Recommendations Home health PT;Supervision for mobility/OOB    Equipment Recommendations  None recommended by PT    Recommendations for Other Services       Precautions / Restrictions Precautions Precautions: Fall      Mobility  Bed Mobility Overal bed mobility: Needs Assistance Bed Mobility: Supine to Sit     Supine to sit: Min assist     General bed mobility comments: +rail, increased time and effort  Transfers Overall transfer level: Needs assistance Equipment used: Rolling walker (2 wheeled) Transfers: Sit to/from Stand Sit to Stand: Min assist         General transfer comment: verbal cues for hand placement, assist to power up, increased time to stabilize initial standing balance  Ambulation/Gait Ambulation/Gait assistance: Min guard Ambulation Distance (Feet): 5 Feet Assistive device: Rolling walker (2 wheeled) Gait Pattern/deviations: Step-through pattern;Decreased stride length;Trunk flexed Gait velocity: decreased Gait velocity interpretation: Below normal speed for age/gender General Gait Details: short, shuffle steps bed to recliner  Stairs            Wheelchair Mobility    Modified Rankin  (Stroke Patients Only)       Balance Overall balance assessment: Needs assistance Sitting-balance support: No upper extremity supported;Feet supported Sitting balance-Leahy Scale: Fair     Standing balance support: Bilateral upper extremity supported;During functional activity Standing balance-Leahy Scale: Poor Standing balance comment: heavy reliance on RW                             Pertinent Vitals/Pain Pain Assessment: No/denies pain    Home Living Family/patient expects to be discharged to:: Private residence Living Arrangements: Alone Available Help at Discharge: Personal care attendant;Family;Available PRN/intermittently Type of Home: Independent living facility Home Access: Level entry     Home Layout: One level Home Equipment: Hand held shower head;Walker - 4 wheels;Walker - 2 wheels;Cane - single point;Tub bench;Wheelchair - manual;Grab bars - toilet Additional Comments: Aide 7 days/week in the AM    Prior Function Level of Independence: Independent with assistive device(s)         Comments: Amb with rollator, Aide assists with bath, getting dressed, and laundry     Hand Dominance   Dominant Hand: Right    Extremity/Trunk Assessment   Upper Extremity Assessment Upper Extremity Assessment: Generalized weakness    Lower Extremity Assessment Lower Extremity Assessment: Generalized weakness    Cervical / Trunk Assessment Cervical / Trunk Assessment: Kyphotic  Communication   Communication: No difficulties  Cognition Arousal/Alertness: Awake/alert Behavior During Therapy: WFL for tasks assessed/performed Overall Cognitive Status: Within Functional Limits for tasks assessed  General Comments      Exercises     Assessment/Plan    PT Assessment Patient needs continued PT services  PT Problem List Decreased strength;Decreased mobility;Decreased activity tolerance;Decreased  balance       PT Treatment Interventions DME instruction;Therapeutic activities;Gait training;Therapeutic exercise;Patient/family education;Balance training;Functional mobility training    PT Goals (Current goals can be found in the Care Plan section)  Acute Rehab PT Goals Patient Stated Goal: back to ILF apartment PT Goal Formulation: With patient Time For Goal Achievement: 02/17/18 Potential to Achieve Goals: Good    Frequency Min 3X/week   Barriers to discharge        Co-evaluation               AM-PAC PT "6 Clicks" Daily Activity  Outcome Measure Difficulty turning over in bed (including adjusting bedclothes, sheets and blankets)?: A Lot Difficulty moving from lying on back to sitting on the side of the bed? : A Lot Difficulty sitting down on and standing up from a chair with arms (e.g., wheelchair, bedside commode, etc,.)?: A Lot Help needed moving to and from a bed to chair (including a wheelchair)?: A Little Help needed walking in hospital room?: A Little Help needed climbing 3-5 steps with a railing? : A Lot 6 Click Score: 14    End of Session Equipment Utilized During Treatment: Gait belt Activity Tolerance: Patient tolerated treatment well Patient left: with call bell/phone within reach;with nursing/sitter in room;with chair alarm set;in chair Nurse Communication: Mobility status PT Visit Diagnosis: Other abnormalities of gait and mobility (R26.89);Difficulty in walking, not elsewhere classified (R26.2);Muscle weakness (generalized) (M62.81)    Time: 6270-3500 PT Time Calculation (min) (ACUTE ONLY): 15 min   Charges:   PT Evaluation $PT Eval Moderate Complexity: 1 Mod     PT G Codes:        Aida Raider, PT  Office # [phone] Pager [phone]   Ilda Foil 02/03/2018, 9:35 AM

## 2018-02-04 DIAGNOSIS — M05712 Rheumatoid arthritis with rheumatoid factor of left shoulder without organ or systems involvement: Secondary | ICD-10-CM

## 2018-02-04 DIAGNOSIS — R6510 Systemic inflammatory response syndrome (SIRS) of non-infectious origin without acute organ dysfunction: Secondary | ICD-10-CM

## 2018-02-04 DIAGNOSIS — M05711 Rheumatoid arthritis with rheumatoid factor of right shoulder without organ or systems involvement: Secondary | ICD-10-CM

## 2018-02-04 DIAGNOSIS — I10 Essential (primary) hypertension: Secondary | ICD-10-CM

## 2018-02-04 DIAGNOSIS — R531 Weakness: Secondary | ICD-10-CM

## 2018-02-04 LAB — GLUCOSE, CAPILLARY
Glucose-Capillary: 121 mg/dL — ABNORMAL HIGH (ref 65–99)
Glucose-Capillary: 132 mg/dL — ABNORMAL HIGH (ref 65–99)

## 2018-02-04 MED ORDER — LABETALOL HCL 200 MG PO TABS
200.0000 mg | ORAL_TABLET | Freq: Two times a day (BID) | ORAL | Status: DC
  Administered 2018-02-04: 200 mg via ORAL
  Filled 2018-02-04: qty 1

## 2018-02-04 MED ORDER — LABETALOL HCL 5 MG/ML IV SOLN
5.0000 mg | INTRAVENOUS | Status: DC | PRN
  Filled 2018-02-04: qty 4

## 2018-02-04 MED ORDER — LISINOPRIL 2.5 MG PO TABS: 2.5000 mg | ORAL_TABLET | Freq: Every day | ORAL | 0 refills | Status: DC

## 2018-02-04 MED ORDER — LISINOPRIL 5 MG PO TABS
5.0000 mg | ORAL_TABLET | Freq: Every day | ORAL | Status: DC
  Administered 2018-02-04: 5 mg via ORAL
  Filled 2018-02-04: qty 1

## 2018-02-04 NOTE — Progress Notes (Signed)
Pt discharged to home, instruction reviewed acknowledge understanding and question answered. SRP,RN

## 2018-02-04 NOTE — Progress Notes (Signed)
B/P 175/94.  PCP on call was notified

## 2018-02-04 NOTE — Progress Notes (Signed)
B/P was 152/93

## 2018-02-04 NOTE — Discharge Summary (Addendum)
Physician Discharge Summary  [name] [name] UEK:[id number] DOB: 01-31-1930 DOA: 02/02/2018  PCP: Briscoe Deutscher, DO  Admit date: 02/02/2018 Discharge date: 02/04/2018  Admitted From: Home Disposition:  Home  Recommendations for Outpatient Follow-up:  1. Follow up with PCP in 2-3 weeks  Home Health:PT/OT   Discharge Condition:Improved CODE STATUS:DNR Diet recommendation: diabetic   Brief/Interim Summary: 84 year old female who presented with generalized weakness. She does have a significant past medical history of type 2 diabetes mellitus, RA/OA, coronary disease, chronic lower extremity wounds,andhistory of CVA. Patient complains of generalized weakness for the last 2 days prior to hospitalization. She felt stiff, unable to ambulate,decreased appetite, diarrhea,andfevers.On initial physical examination blood pressure 173/83, heart rate 91, respiration 19, temperature 101.1, oxygen saturation 99%, dry mucous membranes, lungs clear to auscultation bilaterally, heart sounds S1, S2 present rhythmic, abdomen soft nontender, no lower extremity edema. Sodium 134, potassium 3.2, chloride 100, bicarb 24, glucose 90, BUN 25, creatinine 1.0, white count 8.8, hemoglobin 9.4, hematocrit 35.2, platelets 258,urinalysis negative for infection.Chest x-ray, right rotation, hyperinflation, no infiltrates. EKG sinus tachycardia 107 bpm, normal axis, normal intervals.  Patient was admitted to the hospital with febrile syndrome, rule out sepsis.   1.Febrile viral syndrome, sepsis ruled out. Presented with fevers with no infectious process identified. Likely viral process. Clinically improved without antibiotics. Now afebrile   2. HTN. Continued with hydralazine 100 mg tid. Have resumed home labetalol with addition of low dose ACEI. Tolerated regimen while in hospital.  3. T2DM. Continue glucose cover and monitoring with insulin sliding scale. Continue home regimen on discharge  4.  Hypokalemia. Replaced  5. Gout/ RA. No clinical signs of acute gout flare, usually affecting right foot. Continue allopurinol.continue home dose of prednisone, patient on methotrexate at home once weekly. Stable    6. Iron deficiency. Will continue fe sulfate.   Non-pressure wound  Stage 3 CKD  Discharge Diagnoses:  Principal Problem:   SIRS (systemic inflammatory response syndrome) (HCC) Active Problems:   Reactive airway disease   Gout   HTN (hypertension)   Rheumatoid arthritis (HCC)   DM (diabetes mellitus), type 2 with renal complications (HCC)   Diarrhea   Hypokalemia    Discharge Instructions   Allergies as of 02/04/2018      Reactions   Clonidine Derivatives Swelling   [name]   Fish Allergy Diarrhea, Swelling, Other (See Comments)   Turns skin "black," but can tolerate white fish Salmon- Diarrhea   Shellfish Allergy Hives   Doxycycline Rash   Indomethacin    Reaction not recalled by the patient   Lyrica [pregabalin]    Hallucinations   Methyldopa    Aldomet (for hypertension): Reaction not recalled by the patient   [employer] [citrus] Other (See Comments)   Indigestion/heartburn   Cetirizine Hcl Itching, Rash   Codeine Itching   Levaquin [levofloxacin In D5w] Rash   Tomato Rash      Medication List    STOP taking these medications   erythromycin ophthalmic ointment   ketoconazole 2 % cream Commonly known as:  NIZORAL   triamcinolone ointment 0.5 % Commonly known as:  KENALOG     TAKE these medications   ADVAIR DISKUS 250-50 MCG/DOSE Aepb Generic drug:  Fluticasone-Salmeterol Inhale 1 puff into the lungs 2 (two) times daily.   albuterol 108 (90 Base) MCG/ACT inhaler Commonly known as:  PROVENTIL HFA;VENTOLIN HFA Inhale 1 puff into the lungs every 6 (six) hours as needed for wheezing  or shortness of breath.   ALIVE ONCE DAILY WOMENS PO Take 15 mLs by mouth every  other day.   allopurinol 300 MG tablet Commonly known as:  ZYLOPRIM Take 0.5 tablets (150 mg total) by mouth every evening.   aspirin EC 81 MG tablet Take 81 mg by mouth daily.   blood glucose meter kit and supplies Kit Dispense based on patient and insurance preference. Use up to four times daily as directed. (FOR ICD-9 250.00, 250.01).   calcium-vitamin D 500-200 MG-UNIT tablet Commonly known as:  OSCAL WITH D Take 1 tablet by mouth daily.   CHLOROPHYLL PO Take 5 drops by mouth See admin instructions. MIX INTO 6 OUNCES OF JUICE AND DRINK ONCE A DAY   diclofenac sodium 1 % Gel Commonly known as:  VOLTAREN Apply 1 application topically daily.   ferrous sulfate 325 (65 FE) MG tablet Take 325 mg by mouth daily.   furosemide 20 MG tablet Commonly known as:  LASIX Take 1 tablet (20 mg total) by mouth every other day.   gabapentin 100 MG capsule Commonly known as:  NEURONTIN Take 1 capsule (100 mg total) at bedtime by mouth.   glucose blood test strip Test QID dx code E11.9   hydrALAZINE 100 MG tablet Commonly known as:  APRESOLINE take 1 tablet by mouth three times a day   hydroxypropyl methylcellulose / hypromellose 2.5 % ophthalmic solution Commonly known as:  ISOPTO TEARS / GONIOVISC Place 1 drop into both eyes 4 (four) times daily as needed for dry eyes.   labetalol 100 MG tablet Commonly known as:  NORMODYNE take 2 tablets by mouth twice a day   lisinopril 2.5 MG tablet Commonly known as:  PRINIVIL,ZESTRIL Take 1 tablet (2.5 mg total) by mouth daily.   metFORMIN 500 MG tablet Commonly known as:  GLUCOPHAGE Take 1 tablet (500 mg total) by mouth daily with breakfast.   methotrexate 2.5 MG tablet Commonly known as:  RHEUMATREX Take 10 mg by mouth once a week.   oxycodone 5 MG capsule Commonly known as:  OXY-IR Take 1 capsule (5 mg total) by mouth every 4 (four) hours as needed for pain.   predniSONE 10 MG tablet Commonly known as:  DELTASONE Take 1  tablet (10 mg total) by mouth daily with breakfast.   traMADol 50 MG tablet Commonly known as:  ULTRAM Take 1-2 tablets every 6 hours as needed for pain   vitamin C 500 MG tablet Commonly known as:  ASCORBIC ACID Take 500 mg by mouth every other day.      Follow-up Information    Briscoe Deutscher, DO. Schedule an appointment as soon as possible for a visit in 2 week(s).   Specialty:  Family Medicine Contact information: [place] [address] [phone]          Allergies  Allergen Reactions  . Clonidine Derivatives Swelling    [name]  . Fish Allergy Diarrhea, Swelling and Other (See Comments)    Turns skin "black," but can tolerate white fish Salmon- Diarrhea  . Shellfish Allergy Hives  . Doxycycline Rash  . Indomethacin     Reaction not recalled by the patient  . Lyrica [Pregabalin]     Hallucinations   . Methyldopa     Aldomet (for hypertension): Reaction not recalled by the patient  . [employer] [Citrus] Other (See Comments)    Indigestion/heartburn  . Cetirizine Hcl Itching and Rash  .  Codeine Itching  . Levaquin [Levofloxacin In D5w] Rash  . Tomato Rash     Procedures/Studies: Dg Chest Port 1 View  Result Date: 02/02/2018 CLINICAL DATA:  84 year old female with fever and weakness. EXAM: PORTABLE CHEST 1 VIEW COMPARISON:  Chest radiograph dated 11/26/2017 FINDINGS: Stable cardiomegaly. No congestion or edema. There is no focal consolidation, pleural effusion, or pneumothorax. Degenerative changes of the shoulders and right acromial pins. No acute osseous pathology. IMPRESSION: No active disease. Cardiomegaly. Electronically Signed   By: Anner Crete [name]   On: 02/02/2018 02:25     Subjective: Eager to go home  Discharge Exam: Vitals:   02/04/18 1126 02/04/18 1426  BP: (!) 175/81 (!) 111/51  Pulse: (!) 101 87  Resp:  18  Temp:  (!) 97.5 F (36.4 C)   SpO2:  100%   Vitals:   02/04/18 0438 02/04/18 0546 02/04/18 1126 02/04/18 1426  BP: (!) 175/94 (!) 152/93 (!) 175/81 (!) 111/51  Pulse: (!) 112 (!) 114 (!) 101 87  Resp: 18   18  Temp: 98.3 F (36.8 C)   (!) 97.5 F (36.4 C)  TempSrc: Oral   Oral  SpO2: 100%   100%  Weight: 88.6 kg (195 lb 5.2 oz)     Height:        General: Pt is alert, awake, not in acute distress Cardiovascular: RRR, S1/S2 +, no rubs, no gallops Respiratory: CTA bilaterally, no wheezing, no rhonchi Abdominal: Soft, NT, ND, bowel sounds + Extremities: no edema, no cyanosis   The results of significant diagnostics from this hospitalization (including imaging, microbiology, ancillary and laboratory) are listed below for reference.     Microbiology: Recent Results (from the past 240 hour(s))  Culture, blood (Routine x 2)     Status: None (Preliminary result)   Collection Time: 02/02/18  2:12 AM  Result Value Ref Range Status   Specimen Description   Final    BLOOD LEFT FOREARM Performed at [facility] [address], [place], [place] [address]    Special Requests   Final    BOTTLES DRAWN AEROBIC AND ANAEROBIC Blood Culture adequate volume Performed at [facility] [address] [place], [place] [address]    Culture   Final    NO GROWTH 1 DAY Performed at [facility], [place] [address], [place], [place] [address]    Report Status PENDING  Incomplete  Culture, blood (Routine x 2)     Status: None (Preliminary result)   Collection Time: 02/02/18  2:12 AM  Result Value Ref Range Status   Specimen Description   Final    BLOOD LEFT HAND Performed at [facility] [address] [place], [place] [address]    Special Requests   Final    BOTTLES DRAWN AEROBIC AND ANAEROBIC Blood Culture results may not be optimal due to an inadequate volume of blood received in culture bottles Performed at [facility] [address] [place], [place] [address]    Culture   Final    NO GROWTH 1 DAY Performed at [facility], [place] [address] [place], [place] [address]    Report Status PENDING  Incomplete  Respiratory Panel by PCR     Status: None   Collection Time: 02/02/18  4:00 AM  Result Value Ref Range Status   Adenovirus NOT DETECTED NOT DETECTED Final   Coronavirus 229E NOT DETECTED NOT DETECTED Final   Coronavirus HKU1 NOT  DETECTED NOT DETECTED Final   Coronavirus NL63 NOT DETECTED NOT DETECTED Final   Coronavirus OC43 NOT DETECTED NOT DETECTED Final   Metapneumovirus NOT DETECTED NOT DETECTED Final   Rhinovirus / Enterovirus NOT DETECTED NOT DETECTED Final   Influenza A NOT DETECTED NOT DETECTED Final   Influenza B NOT DETECTED NOT DETECTED Final   Parainfluenza Virus 1 NOT DETECTED NOT DETECTED Final   Parainfluenza Virus 2 NOT DETECTED NOT DETECTED Final   Parainfluenza Virus 3 NOT DETECTED NOT DETECTED Final   Parainfluenza Virus 4 NOT DETECTED NOT DETECTED Final   Respiratory Syncytial Virus NOT DETECTED NOT DETECTED Final   Bordetella pertussis NOT DETECTED NOT DETECTED Final   Chlamydophila pneumoniae NOT DETECTED NOT DETECTED Final   Mycoplasma pneumoniae NOT DETECTED NOT DETECTED Final    Comment: Performed at [facility], [place] [address], [place], [place] [address]  Urine culture     Status: None   Collection Time: 02/02/18  4:48 AM  Result Value Ref Range Status   Specimen Description   Final    URINE, CATHETERIZED Performed at [facility] [address], [place], [place] [address]    Special Requests   Final    Normal Performed at [facility], [place] [address], [place], [place] [address]    Culture   Final    NO GROWTH Performed at [facility], [place] [address], [place], [place] [address]    Report Status 02/03/2018 FINAL  Final  Gastrointestinal Panel by PCR , Stool     Status: None   Collection Time: 02/02/18 12:21 PM  Result  Value Ref Range Status   Campylobacter species NOT DETECTED NOT DETECTED Final   Plesimonas shigelloides NOT DETECTED NOT DETECTED Final   Salmonella species NOT DETECTED NOT DETECTED Final   Yersinia enterocolitica NOT DETECTED NOT DETECTED Final   Vibrio species NOT DETECTED NOT DETECTED Final   Vibrio cholerae NOT DETECTED NOT DETECTED Final   Enteroaggregative E coli (EAEC) NOT DETECTED NOT DETECTED Final   Enteropathogenic E coli (EPEC) NOT DETECTED NOT DETECTED Final   Enterotoxigenic E coli (ETEC) NOT DETECTED NOT DETECTED Final   Shiga like toxin producing E coli (STEC) NOT DETECTED NOT DETECTED Final   Shigella/Enteroinvasive E coli (EIEC) NOT DETECTED NOT DETECTED Final   Cryptosporidium NOT DETECTED NOT DETECTED Final   Cyclospora cayetanensis NOT DETECTED NOT DETECTED Final   Entamoeba histolytica NOT DETECTED NOT DETECTED Final   Giardia lamblia NOT DETECTED NOT DETECTED Final   Adenovirus F40/41 NOT DETECTED NOT DETECTED Final   Astrovirus NOT DETECTED NOT DETECTED Final   Norovirus GI/GII NOT DETECTED NOT DETECTED Final   Rotavirus A NOT DETECTED NOT DETECTED Final   Sapovirus (I, II, IV, and V) NOT DETECTED NOT DETECTED Final    Comment: Performed at [facility], [place] [place], [place] [address]  C difficile quick scan w PCR reflex     Status: None   Collection Time: 02/02/18 12:21 PM  Result Value Ref Range Status   C Diff antigen NEGATIVE NEGATIVE Final   C Diff toxin NEGATIVE NEGATIVE Final   C Diff interpretation No C. difficile detected.  Final    Comment: Performed at [facility], [place] [address] [place], [place] [address]     Labs: BNP (last 3 results) No results for input(s): BNP in the last 8760 hours. Basic Metabolic Panel: Recent Labs  Lab 02/02/18 0212 02/03/18 0352  NA  134* 141  K 3.2* 3.2*  CL 100* 111  CO2 24 22  GLUCOSE 90 156*  BUN 25* 17  CREATININE 1.00 1.04*  CALCIUM 8.7* 8.1*   Liver Function  Tests: Recent Labs  Lab 02/02/18 0212  AST 17  ALT 11*  ALKPHOS 44  BILITOT 0.4  PROT 6.0*  ALBUMIN 3.3*   No results for input(s): LIPASE, AMYLASE in the last 168 hours. No results for input(s): AMMONIA in the last 168 hours. CBC: Recent Labs  Lab 02/02/18 0212 02/03/18 0352  WBC 8.8 10.1  NEUTROABS 6.6  --   HGB 11.4* 10.9*  HCT 35.2* 33.2*  MCV 98.9 98.8  PLT 258 243   Cardiac Enzymes: No results for input(s): CKTOTAL, CKMB, CKMBINDEX, TROPONINI in the last 168 hours. BNP: Invalid input(s): POCBNP CBG: Recent Labs  Lab 02/03/18 1147 02/03/18 1648 02/03/18 2227 02/04/18 0742 02/04/18 1202  GLUCAP 135* 182* 125* 121* 132*   D-Dimer No results for input(s): DDIMER in the last 72 hours. Hgb A1c No results for input(s): HGBA1C in the last 72 hours. Lipid Profile No results for input(s): CHOL, HDL, LDLCALC, TRIG, CHOLHDL, LDLDIRECT in the last 72 hours. Thyroid function studies No results for input(s): TSH, T4TOTAL, T3FREE, THYROIDAB in the last 72 hours.  Invalid input(s): FREET3 Anemia work up No results for input(s): VITAMINB12, FOLATE, FERRITIN, TIBC, IRON, RETICCTPCT in the last 72 hours. Urinalysis    Component Value Date/Time   COLORURINE STRAW (A) 02/02/2018 0448   APPEARANCEUR CLEAR 02/02/2018 0448   LABSPEC 1.008 02/02/2018 0448   PHURINE 5.0 02/02/2018 0448   GLUCOSEU NEGATIVE 02/02/2018 0448   HGBUR NEGATIVE 02/02/2018 0448   BILIRUBINUR NEGATIVE 02/02/2018 0448   BILIRUBINUR Negative 01/13/2017 1234   KETONESUR NEGATIVE 02/02/2018 0448   PROTEINUR NEGATIVE 02/02/2018 0448   UROBILINOGEN 0.2 01/13/2017 1234   UROBILINOGEN 1.0 09/11/2015 0135   NITRITE NEGATIVE 02/02/2018 0448   LEUKOCYTESUR NEGATIVE 02/02/2018 0448   Sepsis Labs Invalid input(s): PROCALCITONIN,  WBC,  LACTICIDVEN Microbiology Recent Results (from the past 240 hour(s))  Culture, blood (Routine x 2)     Status: None (Preliminary result)   Collection Time: 02/02/18  2:12  AM  Result Value Ref Range Status   Specimen Description   Final    BLOOD LEFT FOREARM Performed at [facility], [place] [address], [place], [place] [address]    Special Requests   Final    BOTTLES DRAWN AEROBIC AND ANAEROBIC Blood Culture adequate volume Performed at [facility] [address] [place], [place] [address]    Culture   Final    NO GROWTH 1 DAY Performed at [facility], [place] [address], [place], [place] [address]    Report Status PENDING  Incomplete  Culture, blood (Routine x 2)     Status: None (Preliminary result)   Collection Time: 02/02/18  2:12 AM  Result Value Ref Range Status   Specimen Description   Final    BLOOD LEFT HAND Performed at [facility] [address] [place], [place] [address]    Special Requests   Final    BOTTLES DRAWN AEROBIC AND ANAEROBIC Blood Culture results may not be optimal due to an inadequate volume of blood received in culture bottles Performed at [facility] [address] [place], [place] [address]    Culture   Final    NO GROWTH 1 DAY Performed at [facility], [place] [address]  [place], [place] [address]    Report Status PENDING  Incomplete  Respiratory Panel by PCR     Status: None   Collection Time: 02/02/18  4:00 AM  Result Value Ref Range Status   Adenovirus NOT DETECTED NOT DETECTED Final   Coronavirus 229E NOT DETECTED NOT DETECTED Final   Coronavirus HKU1 NOT DETECTED NOT DETECTED Final   Coronavirus NL63 NOT DETECTED NOT DETECTED Final   Coronavirus OC43 NOT DETECTED NOT DETECTED Final   Metapneumovirus NOT DETECTED NOT DETECTED Final   Rhinovirus / Enterovirus NOT DETECTED NOT DETECTED Final   Influenza A NOT DETECTED NOT DETECTED Final   Influenza B NOT DETECTED NOT DETECTED Final   Parainfluenza Virus 1 NOT DETECTED NOT DETECTED Final   Parainfluenza Virus 2 NOT DETECTED NOT DETECTED Final   Parainfluenza Virus 3 NOT  DETECTED NOT DETECTED Final   Parainfluenza Virus 4 NOT DETECTED NOT DETECTED Final   Respiratory Syncytial Virus NOT DETECTED NOT DETECTED Final   Bordetella pertussis NOT DETECTED NOT DETECTED Final   Chlamydophila pneumoniae NOT DETECTED NOT DETECTED Final   Mycoplasma pneumoniae NOT DETECTED NOT DETECTED Final    Comment: Performed at [facility], [place] [address], [place], [place] [address]  Urine culture     Status: None   Collection Time: 02/02/18  4:48 AM  Result Value Ref Range Status   Specimen Description   Final    URINE, CATHETERIZED Performed at [facility] [address], [place], [place] [address]    Special Requests   Final    Normal Performed at [facility], [place] [address], [place], [place] [address]    Culture   Final    NO GROWTH Performed at [facility], [place] [address] [place], [place] [address]    Report Status 02/03/2018 FINAL  Final  Gastrointestinal Panel by PCR , Stool     Status: None   Collection Time: 02/02/18 12:21 PM  Result Value Ref Range Status   Campylobacter species NOT DETECTED NOT DETECTED Final   Plesimonas shigelloides NOT DETECTED NOT DETECTED Final   Salmonella species NOT DETECTED NOT DETECTED Final   Yersinia enterocolitica NOT DETECTED NOT DETECTED Final   Vibrio species NOT DETECTED NOT DETECTED Final   Vibrio cholerae NOT DETECTED NOT DETECTED Final   Enteroaggregative E coli (EAEC) NOT DETECTED NOT DETECTED Final   Enteropathogenic E coli (EPEC) NOT DETECTED NOT DETECTED Final   Enterotoxigenic E coli (ETEC) NOT DETECTED NOT DETECTED Final   Shiga like toxin producing E coli (STEC) NOT DETECTED NOT DETECTED Final   Shigella/Enteroinvasive E coli (EIEC) NOT DETECTED NOT DETECTED Final   Cryptosporidium NOT DETECTED NOT DETECTED Final   Cyclospora cayetanensis NOT DETECTED NOT DETECTED Final   Entamoeba histolytica NOT DETECTED NOT DETECTED Final   Giardia lamblia NOT DETECTED  NOT DETECTED Final   Adenovirus F40/41 NOT DETECTED NOT DETECTED Final   Astrovirus NOT DETECTED NOT DETECTED Final   Norovirus GI/GII NOT DETECTED NOT DETECTED Final   Rotavirus A NOT DETECTED NOT DETECTED Final   Sapovirus (I, II, IV, and V) NOT DETECTED NOT DETECTED Final    Comment: Performed at [facility], [place] [address] [place], [place] [address]  C difficile quick scan w PCR reflex     Status: None   Collection Time: 02/02/18 12:21 PM  Result Value Ref Range Status   C Diff antigen NEGATIVE NEGATIVE Final   C Diff toxin NEGATIVE NEGATIVE Final  C Diff interpretation No C. difficile detected.  Final    Comment: Performed at [facility], [place] [address], [place], [place] [address]     SIGNED:   Marylu Lund, MD  [facility] 02/04/2018, 2:39 PM  If 7PM-7AM, please contact night-coverage [email or web address] Password [email or web address]

## 2018-02-04 NOTE — Progress Notes (Signed)
Pt will discharge home with [facility], referral given to in house rep.  

## 2018-02-04 NOTE — Progress Notes (Signed)
[name] B/P was 187/88. Hydralazine 10 mg IV was given. Will recheck B/P. PCP on call was notified

## 2018-02-07 ENCOUNTER — Telehealth: Payer: Self-pay | Admitting: *Deleted

## 2018-02-07 LAB — CULTURE, BLOOD (ROUTINE X 2)
Culture: NO GROWTH
Culture: NO GROWTH
Special Requests: ADEQUATE

## 2018-02-07 NOTE — Telephone Encounter (Signed)
Per chart review: Admit date: 02/02/2018 Discharge date: 02/04/2018  Admitted From: Home Disposition:  Home  Recommendations for Outpatient Follow-up:  1. Follow up with PCP in 2-3 weeks  Home Health:PT/OT   Discharge Condition:Improved CODE STATUS:DNR Diet recommendation: diabetic  ____________________________________________________________________ Transition Care Management Follow-up Telephone Call   Date discharged? 02/04/18   How have you been since you were released from the hospital? "weak today and my allergies are bothering me."   Do you understand why you were in the hospital? yes   Do you understand the discharge instructions? yes   Where were you discharged to? Home with home health   Items Reviewed:  Medications reviewed: yes  Allergies reviewed: yes  Dietary changes reviewed: yes  Referrals reviewed: yes   Functional Questionnaire:   Activities of Daily Living (ADLs):   She states they are independent in the following: ambulation, bathing and hygiene, feeding, continence, grooming, toileting and dressing States they require assistance with the following: Home health aide comes in every morning to help her bathe   Any transportation issues/concerns?: no   Any patient concerns? no   Confirmed importance and date/time of follow-up visits scheduled yes  Provider Appointment booked with Dr Earlene Plater 02/14/18 10:00.  Confirmed with patient if condition begins to worsen call PCP or go to the ER.  Patient was given the office number and encouraged to call back with question or concerns.  : yes

## 2018-02-08 ENCOUNTER — Telehealth: Payer: Self-pay | Admitting: Family Medicine

## 2018-02-08 ENCOUNTER — Ambulatory Visit: Payer: Self-pay

## 2018-02-08 DIAGNOSIS — Z8673 Personal history of transient ischemic attack (TIA), and cerebral infarction without residual deficits: Secondary | ICD-10-CM | POA: Diagnosis not present

## 2018-02-08 DIAGNOSIS — Z7982 Long term (current) use of aspirin: Secondary | ICD-10-CM | POA: Diagnosis not present

## 2018-02-08 DIAGNOSIS — M109 Gout, unspecified: Secondary | ICD-10-CM | POA: Diagnosis not present

## 2018-02-08 DIAGNOSIS — Z7952 Long term (current) use of systemic steroids: Secondary | ICD-10-CM | POA: Diagnosis not present

## 2018-02-08 DIAGNOSIS — R6510 Systemic inflammatory response syndrome (SIRS) of non-infectious origin without acute organ dysfunction: Secondary | ICD-10-CM | POA: Diagnosis not present

## 2018-02-08 DIAGNOSIS — L97819 Non-pressure chronic ulcer of other part of right lower leg with unspecified severity: Secondary | ICD-10-CM | POA: Diagnosis not present

## 2018-02-08 DIAGNOSIS — I130 Hypertensive heart and chronic kidney disease with heart failure and stage 1 through stage 4 chronic kidney disease, or unspecified chronic kidney disease: Secondary | ICD-10-CM | POA: Diagnosis not present

## 2018-02-08 DIAGNOSIS — I5023 Acute on chronic systolic (congestive) heart failure: Secondary | ICD-10-CM | POA: Diagnosis not present

## 2018-02-08 DIAGNOSIS — Z7984 Long term (current) use of oral hypoglycemic drugs: Secondary | ICD-10-CM | POA: Diagnosis not present

## 2018-02-08 DIAGNOSIS — R569 Unspecified convulsions: Secondary | ICD-10-CM | POA: Diagnosis not present

## 2018-02-08 DIAGNOSIS — I2510 Atherosclerotic heart disease of native coronary artery without angina pectoris: Secondary | ICD-10-CM | POA: Diagnosis not present

## 2018-02-08 DIAGNOSIS — M1990 Unspecified osteoarthritis, unspecified site: Secondary | ICD-10-CM | POA: Diagnosis not present

## 2018-02-08 DIAGNOSIS — L97219 Non-pressure chronic ulcer of right calf with unspecified severity: Secondary | ICD-10-CM | POA: Diagnosis not present

## 2018-02-08 DIAGNOSIS — N183 Chronic kidney disease, stage 3 (moderate): Secondary | ICD-10-CM | POA: Diagnosis not present

## 2018-02-08 DIAGNOSIS — I252 Old myocardial infarction: Secondary | ICD-10-CM | POA: Diagnosis not present

## 2018-02-08 DIAGNOSIS — Z96653 Presence of artificial knee joint, bilateral: Secondary | ICD-10-CM | POA: Diagnosis not present

## 2018-02-08 DIAGNOSIS — M0689 Other specified rheumatoid arthritis, multiple sites: Secondary | ICD-10-CM | POA: Diagnosis not present

## 2018-02-08 DIAGNOSIS — E11622 Type 2 diabetes mellitus with other skin ulcer: Secondary | ICD-10-CM | POA: Diagnosis not present

## 2018-02-08 DIAGNOSIS — E1122 Type 2 diabetes mellitus with diabetic chronic kidney disease: Secondary | ICD-10-CM | POA: Diagnosis not present

## 2018-02-08 NOTE — Telephone Encounter (Signed)
Please advise 

## 2018-02-08 NOTE — Telephone Encounter (Signed)
Daughter calling on behalf of pt with c/o pt unable to control her bowels. Pt was in hospital for diarrhea and was having bowel incontinence. Now that pt is home and the diarrhea is no longer an issue, incontinence of bowel remains to be present. No protocol found regarding bowel incontinence.  Advised daughter to try bowel retraining. Advised daughter to take pt to the BR every 2 hours and within 30 minutes after eating and right before bed and 4 hours after eating. Routing back to office in case of further guidance regarding bowel incontinence. Reason for Disposition . Nursing judgment  Protocols used: NO GUIDELINE OR REFERENCE AVAILABLE-A-AH

## 2018-02-08 NOTE — Telephone Encounter (Signed)
Need to check stool for C. Diff again. Okay to pick up kit from lab.

## 2018-02-08 NOTE — Telephone Encounter (Signed)
Copied from CRM [id number]. Topic: General - Other >> Feb 08, 2018  4:13 PM Maia Petties wrote: Reason for CRM: please send recent documentation that includes DX of CHF for the patient. Please fax to [phone]. [facility] recommends CHF program that includes getting on the scale daily. Monitor for gain of 2-3 lbs daily and/or 5 lbs weekly.  Would like VO to continue PT 2x week for 3 weeks.  Would like VO for SN for wound care on pts feet/diabetes.

## 2018-02-09 NOTE — Telephone Encounter (Signed)
Okay 

## 2018-02-09 NOTE — Telephone Encounter (Signed)
Do yo want [place] to send this?

## 2018-02-10 ENCOUNTER — Other Ambulatory Visit: Payer: Self-pay

## 2018-02-10 DIAGNOSIS — R197 Diarrhea, unspecified: Secondary | ICD-10-CM

## 2018-02-10 NOTE — Telephone Encounter (Signed)
Called patient gave information lab order placed

## 2018-02-11 NOTE — Telephone Encounter (Signed)
Called and left message with verbal orders for [facility] that he ask for.

## 2018-02-11 NOTE — Telephone Encounter (Signed)
[name] [name] is calling wanting the orders that was called on 02-08-18 please fax orders to [phone]

## 2018-02-14 ENCOUNTER — Encounter: Payer: Self-pay | Admitting: Family Medicine

## 2018-02-14 ENCOUNTER — Ambulatory Visit (INDEPENDENT_AMBULATORY_CARE_PROVIDER_SITE_OTHER): Payer: Medicare Other | Admitting: Family Medicine

## 2018-02-14 VITALS — BP 140/86 | HR 95 | Temp 98.2°F

## 2018-02-14 DIAGNOSIS — R630 Anorexia: Secondary | ICD-10-CM | POA: Diagnosis not present

## 2018-02-14 DIAGNOSIS — R531 Weakness: Secondary | ICD-10-CM

## 2018-02-14 DIAGNOSIS — I10 Essential (primary) hypertension: Secondary | ICD-10-CM

## 2018-02-14 DIAGNOSIS — E1122 Type 2 diabetes mellitus with diabetic chronic kidney disease: Secondary | ICD-10-CM | POA: Diagnosis not present

## 2018-02-14 DIAGNOSIS — Z09 Encounter for follow-up examination after completed treatment for conditions other than malignant neoplasm: Secondary | ICD-10-CM

## 2018-02-14 DIAGNOSIS — R197 Diarrhea, unspecified: Secondary | ICD-10-CM | POA: Diagnosis not present

## 2018-02-14 DIAGNOSIS — N183 Chronic kidney disease, stage 3 unspecified: Secondary | ICD-10-CM

## 2018-02-14 LAB — CBC WITH DIFFERENTIAL/PLATELET
Basophils Absolute: 0.1 10*3/uL (ref 0.0–0.1)
Basophils Relative: 0.6 % (ref 0.0–3.0)
Eosinophils Absolute: 0.1 10*3/uL (ref 0.0–0.7)
Eosinophils Relative: 0.8 % (ref 0.0–5.0)
HCT: 34.7 % — ABNORMAL LOW (ref 36.0–46.0)
Hemoglobin: 11.6 g/dL — ABNORMAL LOW (ref 12.0–15.0)
Lymphocytes Relative: 10.3 % — ABNORMAL LOW (ref 12.0–46.0)
Lymphs Abs: 1.2 10*3/uL (ref 0.7–4.0)
MCHC: 33.5 g/dL (ref 30.0–36.0)
MCV: 97.2 fl (ref 78.0–100.0)
Monocytes Absolute: 0.4 10*3/uL (ref 0.1–1.0)
Monocytes Relative: 3.1 % (ref 3.0–12.0)
Neutro Abs: 10.3 10*3/uL — ABNORMAL HIGH (ref 1.4–7.7)
Neutrophils Relative %: 85.2 % — ABNORMAL HIGH (ref 43.0–77.0)
Platelets: 450 10*3/uL — ABNORMAL HIGH (ref 150.0–400.0)
RBC: 3.57 Mil/uL — ABNORMAL LOW (ref 3.87–5.11)
RDW: 17.6 % — ABNORMAL HIGH (ref 11.5–15.5)
WBC: 12.1 10*3/uL — ABNORMAL HIGH (ref 4.0–10.5)

## 2018-02-14 LAB — COMPREHENSIVE METABOLIC PANEL
ALT: 12 U/L (ref 0–35)
AST: 16 U/L (ref 0–37)
Albumin: 3.8 g/dL (ref 3.5–5.2)
Alkaline Phosphatase: 46 U/L (ref 39–117)
BUN: 16 mg/dL (ref 6–23)
CO2: 27 mEq/L (ref 19–32)
Calcium: 9.6 mg/dL (ref 8.4–10.5)
Chloride: 100 mEq/L (ref 96–112)
Creatinine, Ser: 0.98 mg/dL (ref 0.40–1.20)
GFR: 69.22 mL/min (ref >60.00)
Glucose, Bld: 118 mg/dL — ABNORMAL HIGH (ref 70–99)
Potassium: 3.7 mEq/L (ref 3.5–5.1)
Sodium: 136 mEq/L (ref 135–145)
Total Bilirubin: 0.5 mg/dL (ref 0.2–1.2)
Total Protein: 6.9 g/dL (ref 6.0–8.3)

## 2018-02-14 LAB — VITAMIN B12: Vitamin B-12: 439 pg/mL (ref 211–911)

## 2018-02-14 LAB — TSH: TSH: 1.82 u[IU]/mL (ref 0.35–4.50)

## 2018-02-14 MED ORDER — RANITIDINE HCL 150 MG PO TABS: 150.0000 mg | ORAL_TABLET | Freq: Every day | ORAL | 0 refills | Status: DC

## 2018-02-14 MED ORDER — MIRTAZAPINE 15 MG PO TABS: 15.0000 mg | ORAL_TABLET | Freq: Every day | ORAL | 2 refills | Status: DC

## 2018-02-14 NOTE — Progress Notes (Signed)
SANAII CAPORASO is a 84 y.o. female is here for follow up.  History of Present Illness:   Shaune Pascal CMA acting as scribe for Dr. Juleen China.  HPI:   Ms. Busker went to the emergency room with a complaint of generalized weakness.  She has been hospitalized a few different occasions for rheumatoid arthritis exacerbations and sores on her legs.  She was held for a sepsis rule out.  She was thought to have a viral syndrome that improved without antibiotics.  Her full hospital course was reviewed by me.  Since she has been out of the hospital, she has been weak and not able to eat much.  She did have diarrhea for several days after coming home but that has resolved with Imodium.  Health Maintenance Due  Topic Date Due  . TETANUS/TDAP  04/14/2015  . OPHTHALMOLOGY EXAM  09/12/2015  . PNA vac Low Risk Adult (2 of 2 - PCV13) 09/11/2016  . FOOT EXAM  01/14/2018   Depression screen PHQ 2/9 06/28/2017 03/29/2017 03/04/2016  Decreased Interest 0 0 0  Down, Depressed, Hopeless 0 0 0  PHQ - 2 Score 0 0 0  Some recent data might be hidden   PMHx, SurgHx, SocialHx, FamHx, Medications, and Allergies were reviewed in the Visit Navigator and updated as appropriate.   Patient Active Problem List   Diagnosis Date Noted  . Acute on chronic diastolic heart failure ([place]) 09/12/2015    Priority: High  . CKD (chronic kidney disease) stage 3, GFR 30-59 ml/min (HCC) 06/05/2014    Priority: High  . DM (diabetes mellitus), type 2 with renal complications ([place]) 47/65/4650    Priority: High    Class: Chronic  . Rheumatoid arthritis involving multiple sites ([place]) 12/19/2013    Priority: High    Class: Chronic  . HTN (hypertension) 08/08/2013    Priority: High    Class: Chronic  . Diarrhea 02/02/2018  . Hypokalemia 02/02/2018  . SIRS (systemic inflammatory response syndrome) ([place]) 11/26/2017  . Acute pain of right shoulder 10/12/2017  . Adhesive capsulitis of right shoulder 10/12/2017  .  Idiopathic chronic gout of multiple sites with tophus 08/25/2017  . High risk medication use 08/25/2017  . Rheumatic nodule 06/28/2017  . History of CHF (congestive heart failure) 02/18/2017  . History of diabetes mellitus 02/18/2017  . History of total knee arthroplasty, bilateral 02/18/2017  . History of rotator cuff surgery 02/18/2017  . Obesity (BMI 30.0-34.9) 12/08/2016  . High risk medications (not anticoagulants) long-term use 09/21/2016  . Hammer toe 10/31/2015  . Onychomycosis 04/24/2015  . Midline low back pain without sciatica 06/27/2014  . Bilateral edema of lower extremity 06/05/2014  . Anemia of chronic disease 05/01/2014  . Fluctuating blood pressure 02/06/2014  . Stroke ([place]) 01/17/2014  . CAD (coronary artery disease) 10/18/2013  . Fibromyalgia 05/10/2013  . Reactive airway disease 04/19/2013  . Gout 04/19/2013  . Hyperlipidemia 04/27/2007  . Allergic rhinitis 04/27/2007  . Diverticulosis 04/27/2007   Social History   Tobacco Use  . Smoking status: Never Smoker  . Smokeless tobacco: Never Used  Substance Use Topics  . Alcohol use: No    Alcohol/week: 0.0 oz  . Drug use: No   Current Medications and Allergies:   Current Outpatient Medications:  .  albuterol (PROVENTIL HFA;VENTOLIN HFA) 108 (90 Base) MCG/ACT inhaler, Inhale 1 puff into the lungs every 6 (six) hours as needed for wheezing or shortness of breath., Disp: , Rfl:  .  allopurinol (ZYLOPRIM) 300  MG tablet, Take 0.5 tablets (150 mg total) by mouth every evening., Disp: 15 tablet, Rfl: 0 .  aspirin EC 81 MG tablet, Take 81 mg by mouth daily., Disp: , Rfl:  .  blood glucose meter kit and supplies KIT, Dispense based on patient and insurance preference. Use up to four times daily as directed. (FOR ICD-9 250.00, 250.01)., Disp: 1 each, Rfl: 0 .  calcium-vitamin D (OSCAL WITH D) 500-200 MG-UNIT per tablet, Take 1 tablet by mouth daily. , Disp: , Rfl:  .  CHLOROPHYLL PO, Take 5 drops by mouth See admin  instructions. MIX INTO 6 OUNCES OF JUICE AND DRINK ONCE A DAY, Disp: , Rfl:  .  diclofenac sodium (VOLTAREN) 1 % GEL, Apply 1 application topically daily. , Disp: , Rfl: 0 .  ferrous sulfate 325 (65 FE) MG tablet, Take 325 mg by mouth daily. , Disp: , Rfl:  .  Fluticasone-Salmeterol (ADVAIR DISKUS) 250-50 MCG/DOSE AEPB, Inhale 1 puff into the lungs 2 (two) times daily., Disp: , Rfl:  .  furosemide (LASIX) 20 MG tablet, Take 1 tablet (20 mg total) by mouth every other day., Disp: 30 tablet, Rfl: 5 .  gabapentin (NEURONTIN) 100 MG capsule, Take 1 capsule (100 mg total) at bedtime by mouth., Disp: 30 capsule, Rfl: 3 .  glucose blood test strip, Test QID dx code E11.9, Disp: 100 each, Rfl: 6 .  hydrALAZINE (APRESOLINE) 100 MG tablet, take 1 tablet by mouth three times a day, Disp: 90 tablet, Rfl: 1 .  hydroxypropyl methylcellulose / hypromellose (ISOPTO TEARS / GONIOVISC) 2.5 % ophthalmic solution, Place 1 drop into both eyes 4 (four) times daily as needed for dry eyes., Disp: 15 mL, Rfl: 2 .  labetalol (NORMODYNE) 100 MG tablet, take 2 tablets by mouth twice a day, Disp: 120 tablet, Rfl: 1 .  lisinopril (PRINIVIL,ZESTRIL) 2.5 MG tablet, Take 1 tablet (2.5 mg total) by mouth daily., Disp: 30 tablet, Rfl: 0 .  metFORMIN (GLUCOPHAGE) 500 MG tablet, Take 1 tablet (500 mg total) by mouth daily with breakfast., Disp: 90 tablet, Rfl: 2 .  methotrexate (RHEUMATREX) 2.5 MG tablet, Take 10 mg by mouth once a week. , Disp: , Rfl:  .  Multiple Vitamins-Minerals (ALIVE ONCE DAILY WOMENS PO), Take 15 mLs by mouth every other day. , Disp: , Rfl:  .  oxycodone (OXY-IR) 5 MG capsule, Take 1 capsule (5 mg total) by mouth every 4 (four) hours as needed for pain., Disp: 30 capsule, Rfl: 0 .  predniSONE (DELTASONE) 10 MG tablet, Take 1 tablet (10 mg total) by mouth daily with breakfast., Disp: 14 tablet, Rfl: 0 .  traMADol (ULTRAM) 50 MG tablet, Take 1-2 tablets every 6 hours as needed for pain, Disp: 240 tablet, Rfl: 5 .   vitamin C (ASCORBIC ACID) 500 MG tablet, Take 500 mg by mouth every other day. , Disp: , Rfl:    Allergies  Allergen Reactions  . Clonidine Derivatives Swelling    [name]  . Fish Allergy Diarrhea, Swelling and Other (See Comments)    Turns skin "black," but can tolerate white fish Salmon- Diarrhea  . Shellfish Allergy Hives  . Doxycycline Rash  . Indomethacin     Reaction not recalled by the patient  . Lyrica [Pregabalin]     Hallucinations   . Methyldopa     Aldomet (for hypertension): Reaction not recalled by the patient  . Orange Fruit [Citrus] Other (See Comments)  Indigestion/heartburn  . Cetirizine Hcl Itching and Rash  . Codeine Itching  . Levaquin [Levofloxacin In D5w] Rash  . Tomato Rash   Review of Systems   Pertinent items are noted in the HPI. Otherwise, ROS is negative.  Vitals:   Vitals:   02/14/18 1001  BP: 140/86  Pulse: 95  Temp: 98.2 F (36.8 C)  TempSrc: Oral  SpO2: 96%     There is no height or weight on file to calculate BMI.   Physical Exam:   Physical Exam  Constitutional: She appears well-developed and well-nourished.  Sitting in a wheelchair today which is unusual for her.  HENT:  Head: Normocephalic and atraumatic.  Eyes: Pupils are equal, round, and reactive to light. EOM are normal.  Neck: Normal range of motion. Neck supple.  Cardiovascular: Normal rate, regular rhythm, normal heart sounds and intact distal pulses.  Pulmonary/Chest: Effort normal.  Abdominal: Soft.  Skin: Skin is warm.  Psychiatric: She has a normal mood and affect. Her behavior is normal.  Nursing note and vitals reviewed.   Results for orders placed or performed in visit on 02/14/18  CBC with Differential/Platelet  Result Value Ref Range   WBC 12.1 (H) 4.0 - 10.5 K/uL   RBC 3.57 (L) 3.87 - 5.11 Mil/uL   Hemoglobin 11.6 (L) 12.0 - 15.0 g/dL   HCT 34.7 (L) 36.0 - 46.0 %   MCV 97.2  78.0 - 100.0 fl   MCHC 33.5 30.0 - 36.0 g/dL   RDW 17.6 (H) 11.5 - 15.5 %   Platelets 450.0 (H) 150.0 - 400.0 K/uL   Neutrophils Relative % 85.2 Repeated and verified X2. (H) 43.0 - 77.0 %   Lymphocytes Relative 10.3 (L) 12.0 - 46.0 %   Monocytes Relative 3.1 3.0 - 12.0 %   Eosinophils Relative 0.8 0.0 - 5.0 %   Basophils Relative 0.6 0.0 - 3.0 %   Neutro Abs 10.3 (H) 1.4 - 7.7 K/uL   Lymphs Abs 1.2 0.7 - 4.0 K/uL   Monocytes Absolute 0.4 0.1 - 1.0 K/uL   Eosinophils Absolute 0.1 0.0 - 0.7 K/uL   Basophils Absolute 0.1 0.0 - 0.1 K/uL  Vitamin B12  Result Value Ref Range   Vitamin B-12 439 211 - 911 pg/mL  TSH  Result Value Ref Range   TSH 1.82 0.35 - 4.50 uIU/mL  Comprehensive metabolic panel  Result Value Ref Range   Sodium 136 135 - 145 mEq/L   Potassium 3.7 3.5 - 5.1 mEq/L   Chloride 100 96 - 112 mEq/L   CO2 27 19 - 32 mEq/L   Glucose, Bld 118 (H) 70 - 99 mg/dL   BUN 16 6 - 23 mg/dL   Creatinine, Ser 0.98 0.40 - 1.20 mg/dL   Total Bilirubin 0.5 0.2 - 1.2 mg/dL   Alkaline Phosphatase 46 39 - 117 U/L   AST 16 0 - 37 U/L   ALT 12 0 - 35 U/L   Total Protein 6.9 6.0 - 8.3 g/dL   Albumin 3.8 3.5 - 5.2 g/dL   Calcium 9.6 8.4 - 10.5 mg/dL   GFR 69.22 >60.00 mL/min   Assessment and Plan:   Leighana was seen today for hospitalization follow-up.  Diagnoses and all orders for this visit:  Hospital discharge follow-up Comments: Medication reconciliation:  [x]   Medication list updated [x]   New medication list given to patient/family/caregiver  Referrals: [x]   None needed []   Referrals made to:   Community resources identified for patient/family:  []   None needed  [x]   Home health agency []   Assisted living  []   Hospice  []   Support group  []   Education program  Durable medical equipment ordered:  [x]   None needed  []   DME ordered:   Additional communication delivered or planned:  [x]   Family/Caregiver:  []   Specialists:  []   Other:  Patient education: Topics  discussed: AS ABOVE Handouts given: SEE AVS  Initial transitional care contact was made on 02/07/18 (see separate note).  Weakness Comments: Since with this hospital stay, she has been weak.  We discussed physical therapy and will keep monitoring her closely. Orders: -     Vitamin B12 -     TSH -     Comprehensive metabolic panel  Essential hypertension Comments: No changes today.  We will continue to monitor.  Diarrhea of presumed infectious origin Comments: Recent hospital stay.  She had diarrhea just after leaving.  She is already been provided a stool kit for C. difficile. Orders: -     CBC with Differential/Platelet -     metroNIDAZOLE (FLAGYL) 500 MG tablet; Take 1 tablet (500 mg total) by mouth 3 (three) times daily.  CKD (chronic kidney disease) stage 3, GFR 30-59 ml/min (HCC) Comments: We will recheck today.  Type 2 diabetes mellitus with stage 3 chronic kidney disease, without long-term current use of insulin ([place]) Comments: Stable and will continue to monitor.  Decreased appetite Comments: Since leaving the hospital, she has had a difficult time even drinking water due to lack of appetite.  Trial Remeron.  Also add Zantac in case she has some mild Orders: -     mirtazapine (REMERON) 15 MG tablet; Take 1 tablet (15 mg total) by mouth at bedtime. -     ranitidine (ZANTAC) 150 MG tablet; Take 1 tablet (150 mg total) by mouth at bedtime.    . Reviewed expectations re: course of current medical issues. . Discussed self-management of symptoms. . Outlined signs and symptoms indicating need for more acute intervention. . Patient verbalized understanding and all questions were answered. Marland Kitchen Health Maintenance issues including appropriate healthy diet, exercise, and smoking avoidance were discussed with patient. . See orders for this visit as documented in the electronic medical record. . Patient received an After Visit Summary.  Briscoe Deutscher, DO [place], [place] 02/18/2018  Future Appointments  Date Time Provider [place]  03/14/2018  1:00 PM Briscoe Deutscher, DO [facility]  03/22/2018  1:30 PM Camelia Phenes, DPM [facility]  03/25/2018 10:00 AM Waynetta Sandy, MD [facility]

## 2018-02-15 ENCOUNTER — Telehealth: Payer: Self-pay | Admitting: Family Medicine

## 2018-02-15 DIAGNOSIS — Z96653 Presence of artificial knee joint, bilateral: Secondary | ICD-10-CM | POA: Diagnosis not present

## 2018-02-15 DIAGNOSIS — R569 Unspecified convulsions: Secondary | ICD-10-CM | POA: Diagnosis not present

## 2018-02-15 DIAGNOSIS — I130 Hypertensive heart and chronic kidney disease with heart failure and stage 1 through stage 4 chronic kidney disease, or unspecified chronic kidney disease: Secondary | ICD-10-CM | POA: Diagnosis not present

## 2018-02-15 DIAGNOSIS — R6510 Systemic inflammatory response syndrome (SIRS) of non-infectious origin without acute organ dysfunction: Secondary | ICD-10-CM | POA: Diagnosis not present

## 2018-02-15 DIAGNOSIS — E1122 Type 2 diabetes mellitus with diabetic chronic kidney disease: Secondary | ICD-10-CM | POA: Diagnosis not present

## 2018-02-15 DIAGNOSIS — M0689 Other specified rheumatoid arthritis, multiple sites: Secondary | ICD-10-CM | POA: Diagnosis not present

## 2018-02-15 DIAGNOSIS — Z7984 Long term (current) use of oral hypoglycemic drugs: Secondary | ICD-10-CM | POA: Diagnosis not present

## 2018-02-15 DIAGNOSIS — E11622 Type 2 diabetes mellitus with other skin ulcer: Secondary | ICD-10-CM | POA: Diagnosis not present

## 2018-02-15 DIAGNOSIS — N183 Chronic kidney disease, stage 3 (moderate): Secondary | ICD-10-CM | POA: Diagnosis not present

## 2018-02-15 DIAGNOSIS — I5023 Acute on chronic systolic (congestive) heart failure: Secondary | ICD-10-CM | POA: Diagnosis not present

## 2018-02-15 DIAGNOSIS — I2510 Atherosclerotic heart disease of native coronary artery without angina pectoris: Secondary | ICD-10-CM | POA: Diagnosis not present

## 2018-02-15 DIAGNOSIS — L97219 Non-pressure chronic ulcer of right calf with unspecified severity: Secondary | ICD-10-CM | POA: Diagnosis not present

## 2018-02-15 DIAGNOSIS — Z7982 Long term (current) use of aspirin: Secondary | ICD-10-CM | POA: Diagnosis not present

## 2018-02-15 DIAGNOSIS — I252 Old myocardial infarction: Secondary | ICD-10-CM | POA: Diagnosis not present

## 2018-02-15 DIAGNOSIS — M1990 Unspecified osteoarthritis, unspecified site: Secondary | ICD-10-CM | POA: Diagnosis not present

## 2018-02-15 DIAGNOSIS — Z7952 Long term (current) use of systemic steroids: Secondary | ICD-10-CM | POA: Diagnosis not present

## 2018-02-15 DIAGNOSIS — L97819 Non-pressure chronic ulcer of other part of right lower leg with unspecified severity: Secondary | ICD-10-CM | POA: Diagnosis not present

## 2018-02-15 DIAGNOSIS — Z8673 Personal history of transient ischemic attack (TIA), and cerebral infarction without residual deficits: Secondary | ICD-10-CM | POA: Diagnosis not present

## 2018-02-15 DIAGNOSIS — M109 Gout, unspecified: Secondary | ICD-10-CM | POA: Diagnosis not present

## 2018-02-15 NOTE — Telephone Encounter (Signed)
Called daughter states she started Remeron last night. She states that she does not have any energy. She states that it took her a long time to get woken up this morning and just acting little more weak than normal. No fever, [place] or vomiting. She does not have weakness more than she has from hospital visit. Patient told daughter she feels like she is waking up from surgery like she is under drugs. Daughter wanted to know if it could be the medications or if they need to do something else?

## 2018-02-15 NOTE — Telephone Encounter (Signed)
Called daughter and gave information she will call if any questions. Hold to call back Thursday and check on patient.

## 2018-02-15 NOTE — Telephone Encounter (Signed)
Stop the Remeron because it is too sedating for her at the moment.  Let us check in on Thursday to make sure she has recovered.

## 2018-02-15 NOTE — Telephone Encounter (Signed)
Copied from CRM [id number]. Topic: Quick Communication - See Telephone Encounter >> Feb 15, 2018  1:39 PM Landry Mellow wrote: CRM for notification. See Telephone encounter for: 02/15/18. Daughter called - she says that the 2 new meds are making the pt very sleepy and out of it. She would like to know if any adjustments need to be made.  Please call [phone]

## 2018-02-16 DIAGNOSIS — M0689 Other specified rheumatoid arthritis, multiple sites: Secondary | ICD-10-CM | POA: Diagnosis not present

## 2018-02-16 DIAGNOSIS — Z7982 Long term (current) use of aspirin: Secondary | ICD-10-CM | POA: Diagnosis not present

## 2018-02-16 DIAGNOSIS — L97219 Non-pressure chronic ulcer of right calf with unspecified severity: Secondary | ICD-10-CM | POA: Diagnosis not present

## 2018-02-16 DIAGNOSIS — M109 Gout, unspecified: Secondary | ICD-10-CM | POA: Diagnosis not present

## 2018-02-16 DIAGNOSIS — M1990 Unspecified osteoarthritis, unspecified site: Secondary | ICD-10-CM | POA: Diagnosis not present

## 2018-02-16 DIAGNOSIS — I5023 Acute on chronic systolic (congestive) heart failure: Secondary | ICD-10-CM | POA: Diagnosis not present

## 2018-02-16 DIAGNOSIS — Z8673 Personal history of transient ischemic attack (TIA), and cerebral infarction without residual deficits: Secondary | ICD-10-CM | POA: Diagnosis not present

## 2018-02-16 DIAGNOSIS — I2510 Atherosclerotic heart disease of native coronary artery without angina pectoris: Secondary | ICD-10-CM | POA: Diagnosis not present

## 2018-02-16 DIAGNOSIS — N183 Chronic kidney disease, stage 3 (moderate): Secondary | ICD-10-CM | POA: Diagnosis not present

## 2018-02-16 DIAGNOSIS — Z96653 Presence of artificial knee joint, bilateral: Secondary | ICD-10-CM | POA: Diagnosis not present

## 2018-02-16 DIAGNOSIS — L97819 Non-pressure chronic ulcer of other part of right lower leg with unspecified severity: Secondary | ICD-10-CM | POA: Diagnosis not present

## 2018-02-16 DIAGNOSIS — R569 Unspecified convulsions: Secondary | ICD-10-CM | POA: Diagnosis not present

## 2018-02-16 DIAGNOSIS — Z7952 Long term (current) use of systemic steroids: Secondary | ICD-10-CM | POA: Diagnosis not present

## 2018-02-16 DIAGNOSIS — I252 Old myocardial infarction: Secondary | ICD-10-CM | POA: Diagnosis not present

## 2018-02-16 DIAGNOSIS — Z7984 Long term (current) use of oral hypoglycemic drugs: Secondary | ICD-10-CM | POA: Diagnosis not present

## 2018-02-16 DIAGNOSIS — I130 Hypertensive heart and chronic kidney disease with heart failure and stage 1 through stage 4 chronic kidney disease, or unspecified chronic kidney disease: Secondary | ICD-10-CM | POA: Diagnosis not present

## 2018-02-16 DIAGNOSIS — R6510 Systemic inflammatory response syndrome (SIRS) of non-infectious origin without acute organ dysfunction: Secondary | ICD-10-CM | POA: Diagnosis not present

## 2018-02-16 DIAGNOSIS — E1122 Type 2 diabetes mellitus with diabetic chronic kidney disease: Secondary | ICD-10-CM | POA: Diagnosis not present

## 2018-02-16 DIAGNOSIS — E11622 Type 2 diabetes mellitus with other skin ulcer: Secondary | ICD-10-CM | POA: Diagnosis not present

## 2018-02-16 MED ORDER — METRONIDAZOLE 500 MG PO TABS: 500.0000 mg | ORAL_TABLET | Freq: Three times a day (TID) | ORAL | 0 refills | Status: DC

## 2018-02-17 DIAGNOSIS — I10 Essential (primary) hypertension: Secondary | ICD-10-CM | POA: Diagnosis not present

## 2018-02-17 DIAGNOSIS — L88 Pyoderma gangrenosum: Secondary | ICD-10-CM | POA: Diagnosis not present

## 2018-02-17 DIAGNOSIS — E11622 Type 2 diabetes mellitus with other skin ulcer: Secondary | ICD-10-CM | POA: Diagnosis not present

## 2018-02-17 DIAGNOSIS — M259 Joint disorder, unspecified: Secondary | ICD-10-CM | POA: Diagnosis not present

## 2018-02-17 DIAGNOSIS — L97212 Non-pressure chronic ulcer of right calf with fat layer exposed: Secondary | ICD-10-CM | POA: Diagnosis not present

## 2018-02-17 NOTE — Telephone Encounter (Signed)
Left message to return call to our office. To see how she is feeling.

## 2018-02-17 NOTE — Telephone Encounter (Signed)
Pt's daughter calling in wanting some clarification about her mothers medicine. She is hoping another nurse might be able to call this afternoon since she does take it 3 times a day, but understands Dr. Philis Pique team has gone for the day and is ok if Marland Kitchen calls her in the morning. She is also requesting she be the contact for her mother due to her mom having a hard time hearing on the phone and understanding what is being asked. Daughters CB# [phone].

## 2018-02-18 ENCOUNTER — Encounter: Payer: Medicare Other | Admitting: Vascular Surgery

## 2018-02-18 DIAGNOSIS — M0689 Other specified rheumatoid arthritis, multiple sites: Secondary | ICD-10-CM | POA: Diagnosis not present

## 2018-02-18 DIAGNOSIS — Z96653 Presence of artificial knee joint, bilateral: Secondary | ICD-10-CM | POA: Diagnosis not present

## 2018-02-18 DIAGNOSIS — Z8673 Personal history of transient ischemic attack (TIA), and cerebral infarction without residual deficits: Secondary | ICD-10-CM | POA: Diagnosis not present

## 2018-02-18 DIAGNOSIS — Z7984 Long term (current) use of oral hypoglycemic drugs: Secondary | ICD-10-CM | POA: Diagnosis not present

## 2018-02-18 DIAGNOSIS — L97219 Non-pressure chronic ulcer of right calf with unspecified severity: Secondary | ICD-10-CM | POA: Diagnosis not present

## 2018-02-18 DIAGNOSIS — E1122 Type 2 diabetes mellitus with diabetic chronic kidney disease: Secondary | ICD-10-CM | POA: Diagnosis not present

## 2018-02-18 DIAGNOSIS — M109 Gout, unspecified: Secondary | ICD-10-CM | POA: Diagnosis not present

## 2018-02-18 DIAGNOSIS — I130 Hypertensive heart and chronic kidney disease with heart failure and stage 1 through stage 4 chronic kidney disease, or unspecified chronic kidney disease: Secondary | ICD-10-CM | POA: Diagnosis not present

## 2018-02-18 DIAGNOSIS — Z7982 Long term (current) use of aspirin: Secondary | ICD-10-CM | POA: Diagnosis not present

## 2018-02-18 DIAGNOSIS — R6510 Systemic inflammatory response syndrome (SIRS) of non-infectious origin without acute organ dysfunction: Secondary | ICD-10-CM | POA: Diagnosis not present

## 2018-02-18 DIAGNOSIS — N183 Chronic kidney disease, stage 3 (moderate): Secondary | ICD-10-CM | POA: Diagnosis not present

## 2018-02-18 DIAGNOSIS — E11622 Type 2 diabetes mellitus with other skin ulcer: Secondary | ICD-10-CM | POA: Diagnosis not present

## 2018-02-18 DIAGNOSIS — L97819 Non-pressure chronic ulcer of other part of right lower leg with unspecified severity: Secondary | ICD-10-CM | POA: Diagnosis not present

## 2018-02-18 DIAGNOSIS — I2510 Atherosclerotic heart disease of native coronary artery without angina pectoris: Secondary | ICD-10-CM | POA: Diagnosis not present

## 2018-02-18 DIAGNOSIS — I5023 Acute on chronic systolic (congestive) heart failure: Secondary | ICD-10-CM | POA: Diagnosis not present

## 2018-02-18 DIAGNOSIS — Z7952 Long term (current) use of systemic steroids: Secondary | ICD-10-CM | POA: Diagnosis not present

## 2018-02-18 DIAGNOSIS — M1990 Unspecified osteoarthritis, unspecified site: Secondary | ICD-10-CM | POA: Diagnosis not present

## 2018-02-18 DIAGNOSIS — I252 Old myocardial infarction: Secondary | ICD-10-CM | POA: Diagnosis not present

## 2018-02-18 DIAGNOSIS — R569 Unspecified convulsions: Secondary | ICD-10-CM | POA: Diagnosis not present

## 2018-02-18 NOTE — Telephone Encounter (Signed)
Left message to return call to our office.  

## 2018-02-18 NOTE — Telephone Encounter (Signed)
Daughter called back reviewed all information. She will call if any questions.

## 2018-02-21 DIAGNOSIS — N183 Chronic kidney disease, stage 3 (moderate): Secondary | ICD-10-CM | POA: Diagnosis not present

## 2018-02-21 DIAGNOSIS — Z7984 Long term (current) use of oral hypoglycemic drugs: Secondary | ICD-10-CM | POA: Diagnosis not present

## 2018-02-21 DIAGNOSIS — I5023 Acute on chronic systolic (congestive) heart failure: Secondary | ICD-10-CM | POA: Diagnosis not present

## 2018-02-21 DIAGNOSIS — Z8673 Personal history of transient ischemic attack (TIA), and cerebral infarction without residual deficits: Secondary | ICD-10-CM | POA: Diagnosis not present

## 2018-02-21 DIAGNOSIS — Z7952 Long term (current) use of systemic steroids: Secondary | ICD-10-CM | POA: Diagnosis not present

## 2018-02-21 DIAGNOSIS — R6510 Systemic inflammatory response syndrome (SIRS) of non-infectious origin without acute organ dysfunction: Secondary | ICD-10-CM | POA: Diagnosis not present

## 2018-02-21 DIAGNOSIS — Z96653 Presence of artificial knee joint, bilateral: Secondary | ICD-10-CM | POA: Diagnosis not present

## 2018-02-21 DIAGNOSIS — R569 Unspecified convulsions: Secondary | ICD-10-CM | POA: Diagnosis not present

## 2018-02-21 DIAGNOSIS — M109 Gout, unspecified: Secondary | ICD-10-CM | POA: Diagnosis not present

## 2018-02-21 DIAGNOSIS — Z7982 Long term (current) use of aspirin: Secondary | ICD-10-CM | POA: Diagnosis not present

## 2018-02-21 DIAGNOSIS — E11622 Type 2 diabetes mellitus with other skin ulcer: Secondary | ICD-10-CM | POA: Diagnosis not present

## 2018-02-21 DIAGNOSIS — L97819 Non-pressure chronic ulcer of other part of right lower leg with unspecified severity: Secondary | ICD-10-CM | POA: Diagnosis not present

## 2018-02-21 DIAGNOSIS — I130 Hypertensive heart and chronic kidney disease with heart failure and stage 1 through stage 4 chronic kidney disease, or unspecified chronic kidney disease: Secondary | ICD-10-CM | POA: Diagnosis not present

## 2018-02-21 DIAGNOSIS — I2510 Atherosclerotic heart disease of native coronary artery without angina pectoris: Secondary | ICD-10-CM | POA: Diagnosis not present

## 2018-02-21 DIAGNOSIS — L97219 Non-pressure chronic ulcer of right calf with unspecified severity: Secondary | ICD-10-CM | POA: Diagnosis not present

## 2018-02-21 DIAGNOSIS — M0689 Other specified rheumatoid arthritis, multiple sites: Secondary | ICD-10-CM | POA: Diagnosis not present

## 2018-02-21 DIAGNOSIS — E1122 Type 2 diabetes mellitus with diabetic chronic kidney disease: Secondary | ICD-10-CM | POA: Diagnosis not present

## 2018-02-21 DIAGNOSIS — I252 Old myocardial infarction: Secondary | ICD-10-CM | POA: Diagnosis not present

## 2018-02-21 DIAGNOSIS — M1990 Unspecified osteoarthritis, unspecified site: Secondary | ICD-10-CM | POA: Diagnosis not present

## 2018-02-22 ENCOUNTER — Ambulatory Visit (INDEPENDENT_AMBULATORY_CARE_PROVIDER_SITE_OTHER): Payer: Medicare Other | Admitting: Family Medicine

## 2018-02-22 ENCOUNTER — Ambulatory Visit: Payer: Self-pay | Admitting: *Deleted

## 2018-02-22 VITALS — BP 132/76 | HR 86 | Temp 98.5°F

## 2018-02-22 DIAGNOSIS — S81801D Unspecified open wound, right lower leg, subsequent encounter: Secondary | ICD-10-CM

## 2018-02-22 DIAGNOSIS — M0609 Rheumatoid arthritis without rheumatoid factor, multiple sites: Secondary | ICD-10-CM

## 2018-02-22 DIAGNOSIS — H1013 Acute atopic conjunctivitis, bilateral: Secondary | ICD-10-CM | POA: Diagnosis not present

## 2018-02-22 MED ORDER — OLOPATADINE HCL 0.2 % OP SOLN: 1.0000 [drp] | Freq: Every day | OPHTHALMIC | 3 refills | Status: DC

## 2018-02-22 MED ORDER — DICLOFENAC SODIUM 1 % TD GEL: 1.0000 "application " | Freq: Every day | TRANSDERMAL | 4 refills | Status: DC

## 2018-02-22 MED ORDER — METHOTREXATE 2.5 MG PO TABS: 10.0000 mg | ORAL_TABLET | ORAL | 3 refills | Status: DC

## 2018-02-22 MED ORDER — PREDNISONE 10 MG PO TABS: 10.0000 mg | ORAL_TABLET | Freq: Every day | ORAL | 0 refills | Status: DC

## 2018-02-22 MED ORDER — GABAPENTIN 100 MG PO CAPS: 100.0000 mg | ORAL_CAPSULE | Freq: Every day | ORAL | 3 refills | Status: DC

## 2018-02-22 NOTE — Telephone Encounter (Signed)
Appointment  Made  Today  With Dr Earlene Plater  At  340 pm  - Daughter  Para March  Notified  Will take  Her    Reason for Disposition . [1] Mild eyelid irritation and eye redness are a recurrent problem AND [2] red and crusty eyelids  Answer Assessment - Initial Assessment Questions 1. LOCATION: Location: "What's red, the eyeball or the outer eyelids?" (Note: when callers say the eye is red, they usually mean the sclera is red)        Pink   Eyeballs     2. REDNESS OF SCLERA: "Is the redness in one or both eyes?" "When did the redness start?"         Yest    3. ONSET: "When did the eye become red?" (e.g., hours, days)        2  Days   4. EYELIDS: "Are the eyelids red or swollen?" If so, ask: "How much?"         Crusty  Drainage  In  Am  On  Lids   5. VISION: "Is there any difficulty seeing clearly?"           OK   6. ITCHING: "Does it feel itchy?" If so ask: "How bad is it" (e.g., Scale 1-10; or mild, moderate, severe)           NO 7. PAIN: "Is there any pain? If so, ask: "How bad is it?" (e.g., Scale 1-10; or mild, moderate, severe)      NO 8. CONTACT LENS: "Do you wear contacts?"       NO 9. CAUSE: "What do you think is causing the redness?"      Maybe allergies  Was  Seen 1  Month ago  For  Eye  Infection R eye  Was  Given anti biotics  And  It got  Better   10. OTHER SYMPTOMS: "Do you have any other symptoms?" (e.g., fever, runny nose, cough, vomiting)       no  Protocols used: EYE - RED WITHOUT PUS-A-AH

## 2018-02-22 NOTE — Progress Notes (Signed)
[name] [name] is a 84 y.o. female is here for follow up.  History of Present Illness:   HPI: Patient comes in with bilateral eye irritation.  She reports that her morning when she wakes for the past several mornings.  No trauma.  Seasonal allergies.  Health Maintenance Due  Topic Date Due  . TETANUS/TDAP  04/14/2015  . OPHTHALMOLOGY EXAM  09/12/2015  . PNA vac Low Risk Adult (2 of 2 - PCV13) 09/11/2016  . FOOT EXAM  01/14/2018   Depression screen PHQ 2/9 06/28/2017 03/29/2017 03/04/2016  Decreased Interest 0 0 0  Down, Depressed, Hopeless 0 0 0  PHQ - 2 Score 0 0 0  Some recent data might be hidden   PMHx, SurgHx, SocialHx, FamHx, Medications, and Allergies were reviewed in the Visit Navigator and updated as appropriate.   Patient Active Problem List   Diagnosis Date Noted  . Acute on chronic diastolic heart failure ([place]) 09/12/2015    Priority: High  . CKD (chronic kidney disease) stage 3, GFR 30-59 ml/min (HCC) 06/05/2014    Priority: High  . DM (diabetes mellitus), type 2 with renal complications ([place]) 85/12/7739    Priority: High    Class: Chronic  . Rheumatoid arthritis involving multiple sites ([place]) 12/19/2013    Priority: High    Class: Chronic  . HTN (hypertension) 08/08/2013    Priority: High    Class: Chronic  . Diarrhea 02/02/2018  . Hypokalemia 02/02/2018  . SIRS (systemic inflammatory response syndrome) ([place]) 11/26/2017  . Acute pain of right shoulder 10/12/2017  . Adhesive capsulitis of right shoulder 10/12/2017  . Idiopathic chronic gout of multiple sites with tophus 08/25/2017  . High risk medication use 08/25/2017  . Rheumatic nodule 06/28/2017  . History of CHF (congestive heart failure) 02/18/2017  . History of diabetes mellitus 02/18/2017  . History of total knee arthroplasty, bilateral 02/18/2017  . History of rotator cuff surgery 02/18/2017  . Obesity (BMI 30.0-34.9) 12/08/2016  . High risk medications (not anticoagulants) long-term use  09/21/2016  . Hammer toe 10/31/2015  . Onychomycosis 04/24/2015  . Midline low back pain without sciatica 06/27/2014  . Bilateral edema of lower extremity 06/05/2014  . Anemia of chronic disease 05/01/2014  . Fluctuating blood pressure 02/06/2014  . Stroke ([place]) 01/17/2014  . CAD (coronary artery disease) 10/18/2013  . Fibromyalgia 05/10/2013  . Reactive airway disease 04/19/2013  . Gout 04/19/2013  . Hyperlipidemia 04/27/2007  . Allergic rhinitis 04/27/2007  . Diverticulosis 04/27/2007   Social History   Tobacco Use  . Smoking status: Never Smoker  . Smokeless tobacco: Never Used  Substance Use Topics  . Alcohol use: No    Alcohol/week: 0.0 oz  . Drug use: No   Current Medications and Allergies:   Current Outpatient Medications:  .  albuterol (PROVENTIL HFA;VENTOLIN HFA) 108 (90 Base) MCG/ACT inhaler, Inhale 1 puff into the lungs every 6 (six) hours as needed for wheezing or shortness of breath., Disp: , Rfl:  .  aspirin EC 81 MG tablet, Take 81 mg by mouth daily., Disp: , Rfl:  .  blood glucose meter kit and supplies KIT, Dispense based on patient and insurance preference. Use up to four times daily as directed. (FOR ICD-9 250.00, 250.01)., Disp: 1 each, Rfl: 0 .  calcium-vitamin D (OSCAL WITH D) 500-200 MG-UNIT per tablet, Take 1 tablet by mouth daily. , Disp: , Rfl:  .  CHLOROPHYLL PO, Take 5 drops by mouth See admin instructions. MIX INTO 6 OUNCES  OF JUICE AND DRINK ONCE A DAY, Disp: , Rfl:  .  diclofenac sodium (VOLTAREN) 1 % GEL, Apply 1 application topically daily., Disp: 100 g, Rfl: 4 .  ferrous sulfate 325 (65 FE) MG tablet, Take 325 mg by mouth daily. , Disp: , Rfl:  .  Fluticasone-Salmeterol (ADVAIR DISKUS) 250-50 MCG/DOSE AEPB, Inhale 1 puff into the lungs 2 (two) times daily., Disp: , Rfl:  .  furosemide (LASIX) 20 MG tablet, Take 1 tablet (20 mg total) by mouth every other day., Disp: 30 tablet, Rfl: 5 .  gabapentin (NEURONTIN) 100 MG capsule, Take 1 capsule (100  mg total) by mouth at bedtime., Disp: 30 capsule, Rfl: 3 .  glucose blood test strip, Test QID dx code E11.9, Disp: 100 each, Rfl: 6 .  hydrALAZINE (APRESOLINE) 100 MG tablet, take 1 tablet by mouth three times a day, Disp: 90 tablet, Rfl: 1 .  hydroxypropyl methylcellulose / hypromellose (ISOPTO TEARS / GONIOVISC) 2.5 % ophthalmic solution, Place 1 drop into both eyes 4 (four) times daily as needed for dry eyes., Disp: 15 mL, Rfl: 2 .  labetalol (NORMODYNE) 100 MG tablet, take 2 tablets by mouth twice a day, Disp: 120 tablet, Rfl: 1 .  lisinopril (PRINIVIL,ZESTRIL) 2.5 MG tablet, Take 1 tablet (2.5 mg total) by mouth daily., Disp: 30 tablet, Rfl: 0 .  metFORMIN (GLUCOPHAGE) 500 MG tablet, Take 1 tablet (500 mg total) by mouth daily with breakfast., Disp: 90 tablet, Rfl: 2 .  methotrexate (RHEUMATREX) 2.5 MG tablet, Take 4 tablets (10 mg total) by mouth once a week., Disp: 4 tablet, Rfl: 3 .  Multiple Vitamins-Minerals (ALIVE ONCE DAILY WOMENS PO), Take 15 mLs by mouth every other day. , Disp: , Rfl:  .  oxycodone (OXY-IR) 5 MG capsule, Take 1 capsule (5 mg total) by mouth every 4 (four) hours as needed for pain., Disp: 30 capsule, Rfl: 0 .  predniSONE (DELTASONE) 10 MG tablet, Take 1 tablet (10 mg total) by mouth daily with breakfast., Disp: 14 tablet, Rfl: 0 .  ranitidine (ZANTAC) 150 MG tablet, Take 1 tablet (150 mg total) by mouth at bedtime., Disp: 30 tablet, Rfl: 0 .  traMADol (ULTRAM) 50 MG tablet, Take 1-2 tablets every 6 hours as needed for pain, Disp: 240 tablet, Rfl: 5 .  vitamin C (ASCORBIC ACID) 500 MG tablet, Take 500 mg by mouth every other day. , Disp: , Rfl:  .  allopurinol (ZYLOPRIM) 300 MG tablet, Take 0.5 tablets (150 mg total) by mouth every evening., Disp: 15 tablet, Rfl: 0 .  Olopatadine HCl 0.2 % SOLN, Apply 1 drop to eye daily., Disp: 2.5 mL, Rfl: 3   Allergies  Allergen Reactions  . Clonidine Derivatives Swelling    [name] reports patient's tongue was  swollen and patient hallucinated  . Fish Allergy Diarrhea, Swelling and Other (See Comments)    Turns skin "black," but can tolerate white fish Salmon- Diarrhea  . Shellfish Allergy Hives  . Doxycycline Rash  . Indomethacin     Reaction not recalled by the patient  . Lyrica [Pregabalin]     Hallucinations   . Methyldopa     Aldomet (for hypertension): Reaction not recalled by the patient  . Orange Fruit [Citrus] Other (See Comments)    Indigestion/heartburn  . Cetirizine Hcl Itching and Rash  . Codeine Itching  . Levaquin [Levofloxacin In D5w] Rash  . Tomato Rash   Review of Systems   Pertinent items are noted in the HPI. Otherwise, ROS  is negative.  Vitals:   Vitals:   02/22/18 1550  BP: 132/76  Pulse: 86  Temp: 98.5 F (36.9 C)  TempSrc: Oral  SpO2: 98%     There is no height or weight on file to calculate BMI.   Physical Exam:   Physical Exam  Constitutional: She appears well-nourished.  HENT:  Head: Normocephalic and atraumatic.  Eyes: Pupils are equal, round, and reactive to light. EOM are normal. Right conjunctiva is injected. Left conjunctiva is injected.  Neck: Normal range of motion. Neck supple.  Cardiovascular: Normal rate, regular rhythm and intact distal pulses.  Pulmonary/Chest: Effort normal.  Abdominal: Soft.  Skin: Skin is warm.  Psychiatric: She has a normal mood and affect. Her behavior is normal.  Nursing note and vitals reviewed.   Assessment and Plan:   Tiasia was seen today for eye drainage.  Diagnoses and all orders for this visit:  Acute atopic conjunctivitis of both eyes -     Olopatadine HCl 0.2 % SOLN; Apply 1 drop to eye daily.  Rheumatoid arthritis of multiple sites with negative rheumatoid factor (HCC) -     diclofenac sodium (VOLTAREN) 1 % GEL; Apply 1 application topically daily. -     methotrexate (RHEUMATREX) 2.5 MG tablet; Take 4 tablets (10 mg total) by mouth once a week. -     predniSONE (DELTASONE) 10 MG tablet;  Take 1 tablet (10 mg total) by mouth daily with breakfast.  Wound of right lower extremity, subsequent encounter Comments: Ongoing. Seeing Wound Care. Painful. Orders: -     gabapentin (NEURONTIN) 100 MG capsule; Take 1 capsule (100 mg total) by mouth at bedtime.   . Reviewed expectations re: course of current medical issues. . Discussed self-management of symptoms. . Outlined signs and symptoms indicating need for more acute intervention. . Patient verbalized understanding and all questions were answered. Marland Kitchen Health Maintenance issues including appropriate healthy diet, exercise, and smoking avoidance were discussed with patient. . See orders for this visit as documented in the electronic medical record. . Patient received an After Visit Summary.  Briscoe Deutscher, DO [place] 02/28/2018  Future Appointments  Date Time Provider [place]  03/14/2018  1:00 PM Briscoe Deutscher, DO [facility]  03/22/2018  1:30 PM Camelia Phenes, DPM [facility]  03/25/2018 10:00 AM Waynetta Sandy, MD [facility]

## 2018-02-23 ENCOUNTER — Telehealth: Payer: Self-pay | Admitting: Family Medicine

## 2018-02-23 DIAGNOSIS — I130 Hypertensive heart and chronic kidney disease with heart failure and stage 1 through stage 4 chronic kidney disease, or unspecified chronic kidney disease: Secondary | ICD-10-CM | POA: Diagnosis not present

## 2018-02-23 DIAGNOSIS — Z7952 Long term (current) use of systemic steroids: Secondary | ICD-10-CM | POA: Diagnosis not present

## 2018-02-23 DIAGNOSIS — M109 Gout, unspecified: Secondary | ICD-10-CM | POA: Diagnosis not present

## 2018-02-23 DIAGNOSIS — N183 Chronic kidney disease, stage 3 (moderate): Secondary | ICD-10-CM | POA: Diagnosis not present

## 2018-02-23 DIAGNOSIS — L97219 Non-pressure chronic ulcer of right calf with unspecified severity: Secondary | ICD-10-CM | POA: Diagnosis not present

## 2018-02-23 DIAGNOSIS — E1122 Type 2 diabetes mellitus with diabetic chronic kidney disease: Secondary | ICD-10-CM | POA: Diagnosis not present

## 2018-02-23 DIAGNOSIS — M0689 Other specified rheumatoid arthritis, multiple sites: Secondary | ICD-10-CM | POA: Diagnosis not present

## 2018-02-23 DIAGNOSIS — Z7984 Long term (current) use of oral hypoglycemic drugs: Secondary | ICD-10-CM | POA: Diagnosis not present

## 2018-02-23 DIAGNOSIS — E11622 Type 2 diabetes mellitus with other skin ulcer: Secondary | ICD-10-CM | POA: Diagnosis not present

## 2018-02-23 DIAGNOSIS — I252 Old myocardial infarction: Secondary | ICD-10-CM | POA: Diagnosis not present

## 2018-02-23 DIAGNOSIS — I5023 Acute on chronic systolic (congestive) heart failure: Secondary | ICD-10-CM | POA: Diagnosis not present

## 2018-02-23 DIAGNOSIS — L97819 Non-pressure chronic ulcer of other part of right lower leg with unspecified severity: Secondary | ICD-10-CM | POA: Diagnosis not present

## 2018-02-23 DIAGNOSIS — M1990 Unspecified osteoarthritis, unspecified site: Secondary | ICD-10-CM | POA: Diagnosis not present

## 2018-02-23 DIAGNOSIS — Z96653 Presence of artificial knee joint, bilateral: Secondary | ICD-10-CM | POA: Diagnosis not present

## 2018-02-23 DIAGNOSIS — I2510 Atherosclerotic heart disease of native coronary artery without angina pectoris: Secondary | ICD-10-CM | POA: Diagnosis not present

## 2018-02-23 DIAGNOSIS — R6510 Systemic inflammatory response syndrome (SIRS) of non-infectious origin without acute organ dysfunction: Secondary | ICD-10-CM | POA: Diagnosis not present

## 2018-02-23 DIAGNOSIS — R569 Unspecified convulsions: Secondary | ICD-10-CM | POA: Diagnosis not present

## 2018-02-23 DIAGNOSIS — Z7982 Long term (current) use of aspirin: Secondary | ICD-10-CM | POA: Diagnosis not present

## 2018-02-23 DIAGNOSIS — Z8673 Personal history of transient ischemic attack (TIA), and cerebral infarction without residual deficits: Secondary | ICD-10-CM | POA: Diagnosis not present

## 2018-02-23 NOTE — Telephone Encounter (Signed)
See note.   Copied from CRM [id number]. Topic: General - Other >> Feb 23, 2018  4:10 PM Percival Spanish wrote:  Elissa Hefty with [facility] call to say pt will be out of town next week 02/28/18 thru 03/04/18   and Shaunda would like to see her the week of 03/07/18 for reacessment 1 time  with possible discharge. She is requesting verbal orders that this is ok   [phone]

## 2018-02-24 DIAGNOSIS — M109 Gout, unspecified: Secondary | ICD-10-CM | POA: Diagnosis not present

## 2018-02-24 DIAGNOSIS — L97219 Non-pressure chronic ulcer of right calf with unspecified severity: Secondary | ICD-10-CM | POA: Diagnosis not present

## 2018-02-24 DIAGNOSIS — Z8673 Personal history of transient ischemic attack (TIA), and cerebral infarction without residual deficits: Secondary | ICD-10-CM | POA: Diagnosis not present

## 2018-02-24 DIAGNOSIS — N183 Chronic kidney disease, stage 3 (moderate): Secondary | ICD-10-CM | POA: Diagnosis not present

## 2018-02-24 DIAGNOSIS — Z7952 Long term (current) use of systemic steroids: Secondary | ICD-10-CM | POA: Diagnosis not present

## 2018-02-24 DIAGNOSIS — L97819 Non-pressure chronic ulcer of other part of right lower leg with unspecified severity: Secondary | ICD-10-CM | POA: Diagnosis not present

## 2018-02-24 DIAGNOSIS — I5023 Acute on chronic systolic (congestive) heart failure: Secondary | ICD-10-CM | POA: Diagnosis not present

## 2018-02-24 DIAGNOSIS — Z96653 Presence of artificial knee joint, bilateral: Secondary | ICD-10-CM | POA: Diagnosis not present

## 2018-02-24 DIAGNOSIS — I130 Hypertensive heart and chronic kidney disease with heart failure and stage 1 through stage 4 chronic kidney disease, or unspecified chronic kidney disease: Secondary | ICD-10-CM | POA: Diagnosis not present

## 2018-02-24 DIAGNOSIS — I2510 Atherosclerotic heart disease of native coronary artery without angina pectoris: Secondary | ICD-10-CM | POA: Diagnosis not present

## 2018-02-24 DIAGNOSIS — I252 Old myocardial infarction: Secondary | ICD-10-CM | POA: Diagnosis not present

## 2018-02-24 DIAGNOSIS — Z7984 Long term (current) use of oral hypoglycemic drugs: Secondary | ICD-10-CM | POA: Diagnosis not present

## 2018-02-24 DIAGNOSIS — R569 Unspecified convulsions: Secondary | ICD-10-CM | POA: Diagnosis not present

## 2018-02-24 DIAGNOSIS — E1122 Type 2 diabetes mellitus with diabetic chronic kidney disease: Secondary | ICD-10-CM | POA: Diagnosis not present

## 2018-02-24 DIAGNOSIS — E11622 Type 2 diabetes mellitus with other skin ulcer: Secondary | ICD-10-CM | POA: Diagnosis not present

## 2018-02-24 DIAGNOSIS — M0689 Other specified rheumatoid arthritis, multiple sites: Secondary | ICD-10-CM | POA: Diagnosis not present

## 2018-02-24 DIAGNOSIS — M1990 Unspecified osteoarthritis, unspecified site: Secondary | ICD-10-CM | POA: Diagnosis not present

## 2018-02-24 DIAGNOSIS — Z7982 Long term (current) use of aspirin: Secondary | ICD-10-CM | POA: Diagnosis not present

## 2018-02-24 DIAGNOSIS — R6510 Systemic inflammatory response syndrome (SIRS) of non-infectious origin without acute organ dysfunction: Secondary | ICD-10-CM | POA: Diagnosis not present

## 2018-02-28 ENCOUNTER — Encounter: Payer: Self-pay | Admitting: Family Medicine

## 2018-02-28 NOTE — Telephone Encounter (Signed)
Okay 

## 2018-02-28 NOTE — Telephone Encounter (Signed)
Called [place] gave verbal ok

## 2018-03-04 ENCOUNTER — Telehealth: Payer: Self-pay

## 2018-03-04 NOTE — Telephone Encounter (Signed)
Okay 

## 2018-03-04 NOTE — Telephone Encounter (Signed)
Copied from CRM [id number]. Topic: Quick Communication - See Telephone Encounter >> Mar 04, 2018  1:05 PM Waymon Amato wrote: CRM for notification. See Telephone encounter for: 03/04/18. [facility] is calling to get verbal orders for OT 1 weel for 4 weeks   Best number for Ritu is [phone]

## 2018-03-04 NOTE — Telephone Encounter (Signed)
Returning call.

## 2018-03-04 NOTE — Telephone Encounter (Signed)
Left message to return call to our office.  

## 2018-03-04 NOTE — Telephone Encounter (Signed)
Ok to order 

## 2018-03-08 ENCOUNTER — Other Ambulatory Visit: Payer: Self-pay | Admitting: Family Medicine

## 2018-03-08 ENCOUNTER — Encounter (HOSPITAL_BASED_OUTPATIENT_CLINIC_OR_DEPARTMENT_OTHER): Payer: Medicare Other | Attending: Nurse Practitioner

## 2018-03-08 DIAGNOSIS — I110 Hypertensive heart disease with heart failure: Secondary | ICD-10-CM | POA: Diagnosis not present

## 2018-03-08 DIAGNOSIS — L97212 Non-pressure chronic ulcer of right calf with fat layer exposed: Secondary | ICD-10-CM | POA: Diagnosis not present

## 2018-03-08 DIAGNOSIS — M0689 Other specified rheumatoid arthritis, multiple sites: Secondary | ICD-10-CM | POA: Diagnosis not present

## 2018-03-08 DIAGNOSIS — L97819 Non-pressure chronic ulcer of other part of right lower leg with unspecified severity: Secondary | ICD-10-CM | POA: Diagnosis not present

## 2018-03-08 DIAGNOSIS — I10 Essential (primary) hypertension: Secondary | ICD-10-CM

## 2018-03-08 DIAGNOSIS — Z7952 Long term (current) use of systemic steroids: Secondary | ICD-10-CM | POA: Insufficient documentation

## 2018-03-08 DIAGNOSIS — I2510 Atherosclerotic heart disease of native coronary artery without angina pectoris: Secondary | ICD-10-CM | POA: Insufficient documentation

## 2018-03-08 DIAGNOSIS — I509 Heart failure, unspecified: Secondary | ICD-10-CM | POA: Insufficient documentation

## 2018-03-09 ENCOUNTER — Ambulatory Visit: Payer: Self-pay | Admitting: *Deleted

## 2018-03-09 ENCOUNTER — Other Ambulatory Visit: Payer: Self-pay

## 2018-03-09 ENCOUNTER — Ambulatory Visit: Payer: Self-pay

## 2018-03-09 ENCOUNTER — Other Ambulatory Visit: Payer: Self-pay | Admitting: Family Medicine

## 2018-03-09 ENCOUNTER — Telehealth: Payer: Self-pay | Admitting: *Deleted

## 2018-03-09 DIAGNOSIS — E1122 Type 2 diabetes mellitus with diabetic chronic kidney disease: Secondary | ICD-10-CM | POA: Diagnosis not present

## 2018-03-09 DIAGNOSIS — Z8673 Personal history of transient ischemic attack (TIA), and cerebral infarction without residual deficits: Secondary | ICD-10-CM | POA: Diagnosis not present

## 2018-03-09 DIAGNOSIS — L97219 Non-pressure chronic ulcer of right calf with unspecified severity: Secondary | ICD-10-CM | POA: Diagnosis not present

## 2018-03-09 DIAGNOSIS — M109 Gout, unspecified: Secondary | ICD-10-CM | POA: Diagnosis not present

## 2018-03-09 DIAGNOSIS — R6510 Systemic inflammatory response syndrome (SIRS) of non-infectious origin without acute organ dysfunction: Secondary | ICD-10-CM | POA: Diagnosis not present

## 2018-03-09 DIAGNOSIS — Z7984 Long term (current) use of oral hypoglycemic drugs: Secondary | ICD-10-CM | POA: Diagnosis not present

## 2018-03-09 DIAGNOSIS — I5023 Acute on chronic systolic (congestive) heart failure: Secondary | ICD-10-CM | POA: Diagnosis not present

## 2018-03-09 DIAGNOSIS — N183 Chronic kidney disease, stage 3 unspecified: Secondary | ICD-10-CM

## 2018-03-09 DIAGNOSIS — Z7982 Long term (current) use of aspirin: Secondary | ICD-10-CM | POA: Diagnosis not present

## 2018-03-09 DIAGNOSIS — I252 Old myocardial infarction: Secondary | ICD-10-CM | POA: Diagnosis not present

## 2018-03-09 DIAGNOSIS — Z7952 Long term (current) use of systemic steroids: Secondary | ICD-10-CM | POA: Diagnosis not present

## 2018-03-09 DIAGNOSIS — I2510 Atherosclerotic heart disease of native coronary artery without angina pectoris: Secondary | ICD-10-CM | POA: Diagnosis not present

## 2018-03-09 DIAGNOSIS — R569 Unspecified convulsions: Secondary | ICD-10-CM | POA: Diagnosis not present

## 2018-03-09 DIAGNOSIS — I130 Hypertensive heart and chronic kidney disease with heart failure and stage 1 through stage 4 chronic kidney disease, or unspecified chronic kidney disease: Secondary | ICD-10-CM | POA: Diagnosis not present

## 2018-03-09 DIAGNOSIS — Z96653 Presence of artificial knee joint, bilateral: Secondary | ICD-10-CM | POA: Diagnosis not present

## 2018-03-09 DIAGNOSIS — M1990 Unspecified osteoarthritis, unspecified site: Secondary | ICD-10-CM | POA: Diagnosis not present

## 2018-03-09 DIAGNOSIS — L97819 Non-pressure chronic ulcer of other part of right lower leg with unspecified severity: Secondary | ICD-10-CM | POA: Diagnosis not present

## 2018-03-09 DIAGNOSIS — R531 Weakness: Secondary | ICD-10-CM

## 2018-03-09 DIAGNOSIS — M0689 Other specified rheumatoid arthritis, multiple sites: Secondary | ICD-10-CM | POA: Diagnosis not present

## 2018-03-09 DIAGNOSIS — E11622 Type 2 diabetes mellitus with other skin ulcer: Secondary | ICD-10-CM | POA: Diagnosis not present

## 2018-03-09 NOTE — Telephone Encounter (Signed)
Patients daughter called stating she was given the Tramadol at approx 230  Pm Pt  Did not get any releif  She did  Not get any relief pain is  In her right shoulder down the right arm and to the elbow Patient cannot tolerate any movement.

## 2018-03-09 NOTE — Telephone Encounter (Signed)
Have we put in the referral yet? There was another message.

## 2018-03-09 NOTE — Telephone Encounter (Signed)
Spoke with [name] [name] with home care and she reports BP 154/89  And right sided pain. Also spoke with Para March, pt.'s daughter who states she seems comfortable right now and is getting ready to take a Tramadol. Understands palliative care referral has been made - very appreciative.

## 2018-03-09 NOTE — Telephone Encounter (Signed)
Referral in called [facility] and let her know.

## 2018-03-09 NOTE — Telephone Encounter (Signed)
Patients daughter  Para March called back  She reports the patient obtained no  relief from the Tramadol given at approx 230 Pm  She reports the pain is on Right shoulder down to right elbow - unable to move her about due to the pain  Daughter is requesting perhaps something else for the pain

## 2018-03-09 NOTE — Telephone Encounter (Signed)
Copied from CRM [id number]. Topic: General - Other >> Mar 09, 2018  9:39 AM Marylen Ponto wrote: Reason for CRM: [name] Lattie Corns called wanting a call back regarding patient pain medication. She is on the [facility]. Cb#[phone]

## 2018-03-09 NOTE — Telephone Encounter (Signed)
See note

## 2018-03-09 NOTE — Telephone Encounter (Signed)
Called daughter wanted to let you know that mom has been having increased pain at night. She wanted to know what the next steps for palliative care would be.w

## 2018-03-09 NOTE — Telephone Encounter (Signed)
FYI

## 2018-03-10 ENCOUNTER — Other Ambulatory Visit: Payer: Self-pay | Admitting: Family Medicine

## 2018-03-10 ENCOUNTER — Ambulatory Visit: Payer: Medicare Other | Admitting: *Deleted

## 2018-03-10 DIAGNOSIS — I130 Hypertensive heart and chronic kidney disease with heart failure and stage 1 through stage 4 chronic kidney disease, or unspecified chronic kidney disease: Secondary | ICD-10-CM | POA: Diagnosis not present

## 2018-03-10 DIAGNOSIS — Z7952 Long term (current) use of systemic steroids: Secondary | ICD-10-CM

## 2018-03-10 DIAGNOSIS — R6510 Systemic inflammatory response syndrome (SIRS) of non-infectious origin without acute organ dysfunction: Secondary | ICD-10-CM | POA: Diagnosis not present

## 2018-03-10 DIAGNOSIS — N183 Chronic kidney disease, stage 3 (moderate): Secondary | ICD-10-CM | POA: Diagnosis not present

## 2018-03-10 DIAGNOSIS — E1122 Type 2 diabetes mellitus with diabetic chronic kidney disease: Secondary | ICD-10-CM | POA: Diagnosis not present

## 2018-03-10 DIAGNOSIS — Z96653 Presence of artificial knee joint, bilateral: Secondary | ICD-10-CM

## 2018-03-10 DIAGNOSIS — R569 Unspecified convulsions: Secondary | ICD-10-CM | POA: Diagnosis not present

## 2018-03-10 DIAGNOSIS — I2510 Atherosclerotic heart disease of native coronary artery without angina pectoris: Secondary | ICD-10-CM | POA: Diagnosis not present

## 2018-03-10 DIAGNOSIS — M1990 Unspecified osteoarthritis, unspecified site: Secondary | ICD-10-CM

## 2018-03-10 DIAGNOSIS — L97219 Non-pressure chronic ulcer of right calf with unspecified severity: Secondary | ICD-10-CM | POA: Diagnosis not present

## 2018-03-10 DIAGNOSIS — M109 Gout, unspecified: Secondary | ICD-10-CM | POA: Diagnosis not present

## 2018-03-10 DIAGNOSIS — Z8673 Personal history of transient ischemic attack (TIA), and cerebral infarction without residual deficits: Secondary | ICD-10-CM

## 2018-03-10 DIAGNOSIS — M0609 Rheumatoid arthritis without rheumatoid factor, multiple sites: Secondary | ICD-10-CM

## 2018-03-10 DIAGNOSIS — L97819 Non-pressure chronic ulcer of other part of right lower leg with unspecified severity: Secondary | ICD-10-CM | POA: Diagnosis not present

## 2018-03-10 DIAGNOSIS — Z7984 Long term (current) use of oral hypoglycemic drugs: Secondary | ICD-10-CM

## 2018-03-10 DIAGNOSIS — I5023 Acute on chronic systolic (congestive) heart failure: Secondary | ICD-10-CM | POA: Diagnosis not present

## 2018-03-10 DIAGNOSIS — E11622 Type 2 diabetes mellitus with other skin ulcer: Secondary | ICD-10-CM | POA: Diagnosis not present

## 2018-03-10 DIAGNOSIS — I252 Old myocardial infarction: Secondary | ICD-10-CM | POA: Diagnosis not present

## 2018-03-10 DIAGNOSIS — Z7982 Long term (current) use of aspirin: Secondary | ICD-10-CM

## 2018-03-10 NOTE — Telephone Encounter (Signed)
See phone message as well.

## 2018-03-10 NOTE — Telephone Encounter (Signed)
Called daughter informed that Dr. Earlene Plater is out of office today. She is going to call her rheumatologist and see if they will give her something stronger. If no she will take to ED for evaluation.

## 2018-03-11 DIAGNOSIS — M19011 Primary osteoarthritis, right shoulder: Secondary | ICD-10-CM | POA: Diagnosis not present

## 2018-03-11 DIAGNOSIS — M25511 Pain in right shoulder: Secondary | ICD-10-CM | POA: Diagnosis not present

## 2018-03-13 ENCOUNTER — Other Ambulatory Visit: Payer: Self-pay | Admitting: Family Medicine

## 2018-03-13 DIAGNOSIS — R630 Anorexia: Secondary | ICD-10-CM

## 2018-03-14 ENCOUNTER — Encounter: Payer: Self-pay | Admitting: Family Medicine

## 2018-03-14 ENCOUNTER — Ambulatory Visit (INDEPENDENT_AMBULATORY_CARE_PROVIDER_SITE_OTHER): Payer: Medicare Other | Admitting: Family Medicine

## 2018-03-14 VITALS — BP 150/88 | HR 101 | Temp 98.5°F | Ht 64.0 in | Wt 188.4 lb

## 2018-03-14 DIAGNOSIS — M1A09X1 Idiopathic chronic gout, multiple sites, with tophus (tophi): Secondary | ICD-10-CM

## 2018-03-14 DIAGNOSIS — M7501 Adhesive capsulitis of right shoulder: Secondary | ICD-10-CM

## 2018-03-14 DIAGNOSIS — M0579 Rheumatoid arthritis with rheumatoid factor of multiple sites without organ or systems involvement: Secondary | ICD-10-CM | POA: Diagnosis not present

## 2018-03-14 DIAGNOSIS — Z79899 Other long term (current) drug therapy: Secondary | ICD-10-CM

## 2018-03-14 MED ORDER — OXYCODONE-ACETAMINOPHEN 5-325 MG PO TABS: 1.0000 | ORAL_TABLET | ORAL | 0 refills | Status: DC | PRN

## 2018-03-14 NOTE — Progress Notes (Signed)
[name] [name] is a 84 y.o. female is here for follow up.  History of Present Illness:   [name] [name] acting as scribe for Dr. Juleen China.  HPI: Patient comes in today for her 6 month follow up.   Shoulder Pain: Patient has had increased right shoulder pain. She went to orthopedic surgery and the shoulder is shattered. Patient declined to have surgery. They instead are going to do injections and have put her on a prednisone taper.   Patient believes that she will be able to have this injection every 3 weeks or so.  She was also given a referral to pain management by rheumatology.  Tramadol helps for moderate pain.  She was previously on oxycodone for severe pain.  This is fallen off of her medication list.  She does have an aide coming out 5 days a week for a few hours each day.  She does need more help at night.  She is able to help clean herself but does need assistance with bathing, cooking, cleaning, and getting around.  Health Maintenance Due  Topic Date Due  . TETANUS/TDAP  04/14/2015  . OPHTHALMOLOGY EXAM  09/12/2015  . PNA vac Low Risk Adult (2 of 2 - PCV13) 09/11/2016  . FOOT EXAM  01/14/2018   Depression screen PHQ 2/9 06/28/2017 03/29/2017 03/04/2016  Decreased Interest 0 0 0  Down, Depressed, Hopeless 0 0 0  PHQ - 2 Score 0 0 0  Some recent data might be hidden   PMHx, SurgHx, SocialHx, FamHx, Medications, and Allergies were reviewed in the Visit Navigator and updated as appropriate.   Patient Active Problem List   Diagnosis Date Noted  . Acute on chronic diastolic heart failure ([place]) 09/12/2015    Priority: High  . CKD (chronic kidney disease) stage 3, GFR 30-59 ml/min (HCC) 06/05/2014    Priority: High  . DM (diabetes mellitus), type 2 with renal complications ([place]) 93/90/3009    Priority: High    Class: Chronic  . Rheumatoid arthritis involving multiple sites ([place]) 12/19/2013    Priority: High    Class: Chronic  . HTN (hypertension) 08/08/2013   Priority: High    Class: Chronic  . Diarrhea 02/02/2018  . Hypokalemia 02/02/2018  . SIRS (systemic inflammatory response syndrome) ([place]) 11/26/2017  . Acute pain of right shoulder 10/12/2017  . Adhesive capsulitis of right shoulder 10/12/2017  . Idiopathic chronic gout of multiple sites with tophus 08/25/2017  . High risk medication use 08/25/2017  . Rheumatic nodule 06/28/2017  . History of CHF (congestive heart failure) 02/18/2017  . History of diabetes mellitus 02/18/2017  . History of total knee arthroplasty, bilateral 02/18/2017  . History of rotator cuff surgery 02/18/2017  . Obesity (BMI 30.0-34.9) 12/08/2016  . High risk medications (not anticoagulants) long-term use 09/21/2016  . Hammer toe 10/31/2015  . Onychomycosis 04/24/2015  . Midline low back pain without sciatica 06/27/2014  . Bilateral edema of lower extremity 06/05/2014  . Anemia of chronic disease 05/01/2014  . Fluctuating blood pressure 02/06/2014  . Stroke ([place]) 01/17/2014  . CAD (coronary artery disease) 10/18/2013  . Fibromyalgia 05/10/2013  . Reactive airway disease 04/19/2013  . Gout 04/19/2013  . Hyperlipidemia 04/27/2007  . Allergic rhinitis 04/27/2007  . Diverticulosis 04/27/2007   Social History   Tobacco Use  . Smoking status: Never Smoker  . Smokeless tobacco: Never Used  Substance Use Topics  . Alcohol use: No    Alcohol/week: 0.0 oz  . Drug use: No  Current Medications and Allergies:   Current Outpatient Medications:  .  albuterol (PROVENTIL HFA;VENTOLIN HFA) 108 (90 Base) MCG/ACT inhaler, Inhale 1 puff into the lungs every 6 (six) hours as needed for wheezing or shortness of breath., Disp: , Rfl:  .  allopurinol (ZYLOPRIM) 300 MG tablet, Take 0.5 tablets (150 mg total) by mouth every evening., Disp: 15 tablet, Rfl: 0 .  aspirin EC 81 MG tablet, Take 81 mg by mouth daily., Disp: , Rfl:  .  blood glucose meter kit and supplies KIT, Dispense based on patient and insurance preference. Use  up to four times daily as directed. (FOR ICD-9 250.00, 250.01)., Disp: 1 each, Rfl: 0 .  calcium-vitamin D (OSCAL WITH D) 500-200 MG-UNIT per tablet, Take 1 tablet by mouth daily. , Disp: , Rfl:  .  CHLOROPHYLL PO, Take 5 drops by mouth See admin instructions. MIX INTO 6 OUNCES OF JUICE AND DRINK ONCE A DAY, Disp: , Rfl:  .  diclofenac sodium (VOLTAREN) 1 % GEL, Apply 1 application topically daily., Disp: 100 g, Rfl: 4 .  ferrous sulfate 325 (65 FE) MG tablet, Take 325 mg by mouth daily. , Disp: , Rfl:  .  Fluticasone-Salmeterol (ADVAIR DISKUS) 250-50 MCG/DOSE AEPB, Inhale 1 puff into the lungs 2 (two) times daily., Disp: , Rfl:  .  furosemide (LASIX) 20 MG tablet, Take 1 tablet (20 mg total) by mouth every other day., Disp: 30 tablet, Rfl: 5 .  gabapentin (NEURONTIN) 100 MG capsule, Take 1 capsule (100 mg total) by mouth at bedtime., Disp: 30 capsule, Rfl: 3 .  glucose blood test strip, Test QID dx code E11.9, Disp: 100 each, Rfl: 6 .  hydrALAZINE (APRESOLINE) 100 MG tablet, TAKE 1 TABLET BY MOUTH THREE TIMES DAILY, Disp: 90 tablet, Rfl: 0 .  hydroxypropyl methylcellulose / hypromellose (ISOPTO TEARS / GONIOVISC) 2.5 % ophthalmic solution, Place 1 drop into both eyes 4 (four) times daily as needed for dry eyes., Disp: 15 mL, Rfl: 2 .  labetalol (NORMODYNE) 100 MG tablet, TAKE 2 TABLETS BY MOUTH TWICE A DAY, Disp: 120 tablet, Rfl: 0 .  lisinopril (PRINIVIL,ZESTRIL) 2.5 MG tablet, Take 1 tablet (2.5 mg total) by mouth daily., Disp: 30 tablet, Rfl: 0 .  metFORMIN (GLUCOPHAGE) 500 MG tablet, Take 1 tablet (500 mg total) by mouth daily with breakfast., Disp: 90 tablet, Rfl: 2 .  methotrexate (RHEUMATREX) 2.5 MG tablet, Take 4 tablets (10 mg total) by mouth once a week., Disp: 4 tablet, Rfl: 3 .  Multiple Vitamins-Minerals (ALIVE ONCE DAILY WOMENS PO), Take 15 mLs by mouth every other day. , Disp: , Rfl:  .  Olopatadine HCl 0.2 % SOLN, Apply 1 drop to eye daily., Disp: 2.5 mL, Rfl: 3 .  oxycodone  (OXY-IR) 5 MG capsule, Take 1 capsule (5 mg total) by mouth every 4 (four) hours as needed for pain., Disp: 30 capsule, Rfl: 0 .  predniSONE (DELTASONE) 10 MG tablet, Take 1 tablet (10 mg total) by mouth daily with breakfast., Disp: 14 tablet, Rfl: 0 .  ranitidine (ZANTAC) 150 MG tablet, Take 1 tablet (150 mg total) by mouth at bedtime., Disp: 30 tablet, Rfl: 0 .  traMADol (ULTRAM) 50 MG tablet, Take 1-2 tablets every 6 hours as needed for pain, Disp: 240 tablet, Rfl: 5 .  vitamin C (ASCORBIC ACID) 500 MG tablet, Take 500 mg by mouth every other day. , Disp: , Rfl:    Allergies  Allergen Reactions  . Clonidine Derivatives Swelling    [name]  daughter reports patient's tongue was swollen and patient hallucinated  . Fish Allergy Diarrhea, Swelling and Other (See Comments)    Turns skin "black," but can tolerate white fish Salmon- Diarrhea  . Shellfish Allergy Hives  . Doxycycline Rash  . Indomethacin     Reaction not recalled by the patient  . Lyrica [Pregabalin]     Hallucinations   . Methyldopa     Aldomet (for hypertension): Reaction not recalled by the patient  . Orange Fruit [Citrus] Other (See Comments)    Indigestion/heartburn  . Cetirizine Hcl Itching and Rash  . Codeine Itching  . Levaquin [Levofloxacin In D5w] Rash  . Tomato Rash   Review of Systems   Pertinent items are noted in the HPI. Otherwise, ROS is negative.  Vitals:   Vitals:   03/14/18 1308  BP: (!) 150/88  Pulse: (!) 101  Temp: 98.5 F (36.9 C)  TempSrc: Oral  SpO2: 93%  Weight: 188 lb 6.4 oz (85.5 kg)  Height: 5' 4"  (1.626 m)     Body mass index is 32.34 kg/m.   Physical Exam:   Physical Exam  Constitutional: She appears well-nourished.  HENT:  Head: Normocephalic and atraumatic.  Eyes: Pupils are equal, round, and reactive to light. EOM are normal.  Neck: Normal range of motion. Neck supple.  Cardiovascular: Normal rate, regular rhythm and intact distal pulses.  Pulmonary/Chest: Effort  normal.  Abdominal: Soft.  Skin: Skin is warm.  Psychiatric: She has a normal mood and affect. Her behavior is normal.  Nursing note and vitals reviewed.   Assessment and Plan:   [name] [name] was seen today for follow-up.  Diagnoses and all orders for this visit:  Adhesive capsulitis of right shoulder  Rheumatoid arthritis involving multiple sites with positive rheumatoid factor (HCC)  Idiopathic chronic gout of multiple sites with tophus  High risk medication use  Other orders -     oxyCODONE-acetaminophen (PERCOCET/ROXICET) 5-325 MG tablet; Take 1 tablet by mouth every 4 (four) hours as needed for severe pain.  Working on being proactive in terms of pain control, okay with above medication.  Reviewed red flags.  She generally takes 0.5 tab for severe pain only.  She will continue to take tramadol twice daily pain.  We discussed palliative care for help at home.  I will see if she is a candidate for any other services through [facility].  Marland Kitchen Reviewed expectations re: course of current medical issues. . Discussed self-management of symptoms. . Outlined signs and symptoms indicating need for more acute intervention. . Patient verbalized understanding and all questions were answered. Marland Kitchen Health Maintenance issues including appropriate healthy diet, exercise, and smoking avoidance were discussed with patient. . See orders for this visit as documented in the electronic medical record. . Patient received an After Visit Summary.  Briscoe Deutscher, DO [place], [facility] 03/14/2018  Future Appointments  Date Time Provider [place]  03/22/2018  1:30 PM Camelia Phenes, DPM [facility]  03/25/2018 10:00 AM Waynetta Sandy, MD [facility]

## 2018-03-15 DIAGNOSIS — L97219 Non-pressure chronic ulcer of right calf with unspecified severity: Secondary | ICD-10-CM | POA: Diagnosis not present

## 2018-03-15 DIAGNOSIS — L97819 Non-pressure chronic ulcer of other part of right lower leg with unspecified severity: Secondary | ICD-10-CM | POA: Diagnosis not present

## 2018-03-15 DIAGNOSIS — E1122 Type 2 diabetes mellitus with diabetic chronic kidney disease: Secondary | ICD-10-CM | POA: Diagnosis not present

## 2018-03-15 DIAGNOSIS — N183 Chronic kidney disease, stage 3 (moderate): Secondary | ICD-10-CM | POA: Diagnosis not present

## 2018-03-15 DIAGNOSIS — I5023 Acute on chronic systolic (congestive) heart failure: Secondary | ICD-10-CM | POA: Diagnosis not present

## 2018-03-15 DIAGNOSIS — M0689 Other specified rheumatoid arthritis, multiple sites: Secondary | ICD-10-CM | POA: Diagnosis not present

## 2018-03-15 DIAGNOSIS — M109 Gout, unspecified: Secondary | ICD-10-CM | POA: Diagnosis not present

## 2018-03-15 DIAGNOSIS — Z7952 Long term (current) use of systemic steroids: Secondary | ICD-10-CM | POA: Diagnosis not present

## 2018-03-15 DIAGNOSIS — Z8673 Personal history of transient ischemic attack (TIA), and cerebral infarction without residual deficits: Secondary | ICD-10-CM | POA: Diagnosis not present

## 2018-03-15 DIAGNOSIS — Z7982 Long term (current) use of aspirin: Secondary | ICD-10-CM | POA: Diagnosis not present

## 2018-03-15 DIAGNOSIS — I2510 Atherosclerotic heart disease of native coronary artery without angina pectoris: Secondary | ICD-10-CM | POA: Diagnosis not present

## 2018-03-15 DIAGNOSIS — E11622 Type 2 diabetes mellitus with other skin ulcer: Secondary | ICD-10-CM | POA: Diagnosis not present

## 2018-03-15 DIAGNOSIS — R569 Unspecified convulsions: Secondary | ICD-10-CM | POA: Diagnosis not present

## 2018-03-15 DIAGNOSIS — M1990 Unspecified osteoarthritis, unspecified site: Secondary | ICD-10-CM | POA: Diagnosis not present

## 2018-03-15 DIAGNOSIS — Z7984 Long term (current) use of oral hypoglycemic drugs: Secondary | ICD-10-CM | POA: Diagnosis not present

## 2018-03-15 DIAGNOSIS — I252 Old myocardial infarction: Secondary | ICD-10-CM | POA: Diagnosis not present

## 2018-03-15 DIAGNOSIS — Z96653 Presence of artificial knee joint, bilateral: Secondary | ICD-10-CM | POA: Diagnosis not present

## 2018-03-15 DIAGNOSIS — R6510 Systemic inflammatory response syndrome (SIRS) of non-infectious origin without acute organ dysfunction: Secondary | ICD-10-CM | POA: Diagnosis not present

## 2018-03-15 DIAGNOSIS — I130 Hypertensive heart and chronic kidney disease with heart failure and stage 1 through stage 4 chronic kidney disease, or unspecified chronic kidney disease: Secondary | ICD-10-CM | POA: Diagnosis not present

## 2018-03-18 DIAGNOSIS — Z96653 Presence of artificial knee joint, bilateral: Secondary | ICD-10-CM | POA: Diagnosis not present

## 2018-03-18 DIAGNOSIS — E1122 Type 2 diabetes mellitus with diabetic chronic kidney disease: Secondary | ICD-10-CM | POA: Diagnosis not present

## 2018-03-18 DIAGNOSIS — I252 Old myocardial infarction: Secondary | ICD-10-CM | POA: Diagnosis not present

## 2018-03-18 DIAGNOSIS — I5023 Acute on chronic systolic (congestive) heart failure: Secondary | ICD-10-CM | POA: Diagnosis not present

## 2018-03-18 DIAGNOSIS — L97219 Non-pressure chronic ulcer of right calf with unspecified severity: Secondary | ICD-10-CM | POA: Diagnosis not present

## 2018-03-18 DIAGNOSIS — Z7952 Long term (current) use of systemic steroids: Secondary | ICD-10-CM | POA: Diagnosis not present

## 2018-03-18 DIAGNOSIS — R569 Unspecified convulsions: Secondary | ICD-10-CM | POA: Diagnosis not present

## 2018-03-18 DIAGNOSIS — R6510 Systemic inflammatory response syndrome (SIRS) of non-infectious origin without acute organ dysfunction: Secondary | ICD-10-CM | POA: Diagnosis not present

## 2018-03-18 DIAGNOSIS — M109 Gout, unspecified: Secondary | ICD-10-CM | POA: Diagnosis not present

## 2018-03-18 DIAGNOSIS — E11622 Type 2 diabetes mellitus with other skin ulcer: Secondary | ICD-10-CM | POA: Diagnosis not present

## 2018-03-18 DIAGNOSIS — Z8673 Personal history of transient ischemic attack (TIA), and cerebral infarction without residual deficits: Secondary | ICD-10-CM | POA: Diagnosis not present

## 2018-03-18 DIAGNOSIS — M0689 Other specified rheumatoid arthritis, multiple sites: Secondary | ICD-10-CM | POA: Diagnosis not present

## 2018-03-18 DIAGNOSIS — Z7984 Long term (current) use of oral hypoglycemic drugs: Secondary | ICD-10-CM | POA: Diagnosis not present

## 2018-03-18 DIAGNOSIS — M1990 Unspecified osteoarthritis, unspecified site: Secondary | ICD-10-CM | POA: Diagnosis not present

## 2018-03-18 DIAGNOSIS — L97819 Non-pressure chronic ulcer of other part of right lower leg with unspecified severity: Secondary | ICD-10-CM | POA: Diagnosis not present

## 2018-03-18 DIAGNOSIS — Z7982 Long term (current) use of aspirin: Secondary | ICD-10-CM | POA: Diagnosis not present

## 2018-03-18 DIAGNOSIS — I130 Hypertensive heart and chronic kidney disease with heart failure and stage 1 through stage 4 chronic kidney disease, or unspecified chronic kidney disease: Secondary | ICD-10-CM | POA: Diagnosis not present

## 2018-03-18 DIAGNOSIS — N183 Chronic kidney disease, stage 3 (moderate): Secondary | ICD-10-CM | POA: Diagnosis not present

## 2018-03-18 DIAGNOSIS — I2510 Atherosclerotic heart disease of native coronary artery without angina pectoris: Secondary | ICD-10-CM | POA: Diagnosis not present

## 2018-03-21 ENCOUNTER — Other Ambulatory Visit: Payer: Self-pay | Admitting: Family Medicine

## 2018-03-21 DIAGNOSIS — M0609 Rheumatoid arthritis without rheumatoid factor, multiple sites: Secondary | ICD-10-CM

## 2018-03-21 NOTE — Telephone Encounter (Signed)
Please advise on refill.

## 2018-03-22 ENCOUNTER — Ambulatory Visit: Payer: Medicare Other | Admitting: Podiatry

## 2018-03-23 ENCOUNTER — Encounter (HOSPITAL_BASED_OUTPATIENT_CLINIC_OR_DEPARTMENT_OTHER): Payer: Medicare Other | Attending: Physician Assistant

## 2018-03-23 DIAGNOSIS — Z8673 Personal history of transient ischemic attack (TIA), and cerebral infarction without residual deficits: Secondary | ICD-10-CM | POA: Diagnosis not present

## 2018-03-23 DIAGNOSIS — M069 Rheumatoid arthritis, unspecified: Secondary | ICD-10-CM | POA: Diagnosis not present

## 2018-03-23 DIAGNOSIS — Z96653 Presence of artificial knee joint, bilateral: Secondary | ICD-10-CM | POA: Insufficient documentation

## 2018-03-23 DIAGNOSIS — E669 Obesity, unspecified: Secondary | ICD-10-CM | POA: Diagnosis not present

## 2018-03-23 DIAGNOSIS — N183 Chronic kidney disease, stage 3 (moderate): Secondary | ICD-10-CM | POA: Insufficient documentation

## 2018-03-23 DIAGNOSIS — L97219 Non-pressure chronic ulcer of right calf with unspecified severity: Secondary | ICD-10-CM | POA: Diagnosis not present

## 2018-03-23 DIAGNOSIS — M797 Fibromyalgia: Secondary | ICD-10-CM | POA: Insufficient documentation

## 2018-03-23 DIAGNOSIS — Z7952 Long term (current) use of systemic steroids: Secondary | ICD-10-CM | POA: Insufficient documentation

## 2018-03-23 DIAGNOSIS — Z09 Encounter for follow-up examination after completed treatment for conditions other than malignant neoplasm: Secondary | ICD-10-CM | POA: Diagnosis not present

## 2018-03-23 DIAGNOSIS — Z872 Personal history of diseases of the skin and subcutaneous tissue: Secondary | ICD-10-CM | POA: Insufficient documentation

## 2018-03-23 DIAGNOSIS — Z6830 Body mass index (BMI) 30.0-30.9, adult: Secondary | ICD-10-CM | POA: Diagnosis not present

## 2018-03-24 ENCOUNTER — Telehealth: Payer: Self-pay | Admitting: Family Medicine

## 2018-03-24 NOTE — Telephone Encounter (Signed)
Left message to return call to our office.  

## 2018-03-24 NOTE — Telephone Encounter (Signed)
Copied from CRM [id number]. Topic: Quick Communication - See Telephone Encounter >> Mar 24, 2018 11:55 AM Windy Kalata, NT wrote: CRM for notification. See Telephone encounter for: 03/24/18.  Patient daughter is calling and states she is almost out of prednisone and would like to know if she needs a refill on predniSONE (DELTASONE) 10 MG tablet or what is the next if not patient has 1 pill left.   [id number] [place], [place] - [phone] AT [facility] [address] [place] [address] Phone: [phone] Fax: [phone]

## 2018-03-25 ENCOUNTER — Encounter: Payer: Medicare Other | Admitting: Vascular Surgery

## 2018-03-25 ENCOUNTER — Other Ambulatory Visit: Payer: Self-pay

## 2018-03-25 MED ORDER — PREDNISONE 10 MG PO TABS: 10.0000 mg | ORAL_TABLET | Freq: Every day | ORAL | 0 refills | Status: DC

## 2018-03-25 NOTE — Telephone Encounter (Signed)
Copied from CRM [id number]. Topic: General - Other >> Mar 25, 2018  2:53 PM Elliot Gault wrote:  Jethro Bolus name: Lupita Leash Relation to pt: Nurse from [facility]  Call back number: [phone]   Reason for call:  Nurse assessed patient today and determined patient is not a candidate for hospice but would be a candidate for Palliative care, please fax orders  [phone]  >> Mar 25, 2018  2:56 PM Elliot Gault wrote:  Jethro Bolus name: Lupita Leash Relation to pt: Nurse from [facility]  Call back number: [phone]   Reason for call:  Nurse assessed patient today and determined patient is not a candidate for hospice but would be a candidate for Palliative care, please fax orders  [phone]

## 2018-03-25 NOTE — Telephone Encounter (Signed)
Left message to return call to our office.  

## 2018-03-25 NOTE — Telephone Encounter (Signed)
Spoke to daughter mom took last does of prednisone yesterday and needs refill. After reviewing chart with Dr. Artis Flock given ok to call in 5 days of prednisone to last until you are back in office to get your directions.

## 2018-03-25 NOTE — Telephone Encounter (Signed)
I likely filled this for patient ease but dosage should be managed by Rheum. Please ask patient's family to call Rheum to start managing since I am not sure how long or at what dose to give at this point.

## 2018-03-28 NOTE — Telephone Encounter (Signed)
Please advise 

## 2018-03-28 NOTE — Telephone Encounter (Signed)
Already sent back staff message to them re: same issue but okay order for Palliative Care.

## 2018-03-29 ENCOUNTER — Other Ambulatory Visit: Payer: Self-pay | Admitting: Family Medicine

## 2018-03-29 ENCOUNTER — Other Ambulatory Visit: Payer: Self-pay

## 2018-03-29 DIAGNOSIS — N183 Chronic kidney disease, stage 3 unspecified: Secondary | ICD-10-CM

## 2018-03-29 DIAGNOSIS — Z515 Encounter for palliative care: Secondary | ICD-10-CM

## 2018-03-29 NOTE — Telephone Encounter (Signed)
Order faxed with notes. 

## 2018-03-29 NOTE — Telephone Encounter (Signed)
Copied from CRM [id number]. Topic: Quick Communication - Rx Refill/Question >> Mar 29, 2018  8:47 AM Louie Bun, Rosey Bath D wrote: Medication:traMADol Janean Sark) 50 MG tablet  Has the patient contacted their pharmacy?Yes (Agent: If no, request that the patient contact the pharmacy for the refill.) (Agent: If yes, when and what did the pharmacy advise?)  Preferred Pharmacy (with phone number or street name):[facility] [phone] - [place], [place] - [address]  Agent: Please be advised that RX refills may take up to 3 business days. We ask that you follow-up with your pharmacy.

## 2018-03-29 NOTE — Telephone Encounter (Signed)
Left message for [name] to return call to the office regarding refill of Tramadol. Prescription for medication was sent on 03/21/18 #240 to [facility]    [address]

## 2018-03-30 ENCOUNTER — Encounter: Payer: Self-pay | Admitting: Podiatry

## 2018-03-30 ENCOUNTER — Ambulatory Visit (INDEPENDENT_AMBULATORY_CARE_PROVIDER_SITE_OTHER): Payer: Medicare Other | Admitting: Podiatry

## 2018-03-30 DIAGNOSIS — B351 Tinea unguium: Secondary | ICD-10-CM | POA: Diagnosis not present

## 2018-03-30 DIAGNOSIS — M79671 Pain in right foot: Secondary | ICD-10-CM

## 2018-03-30 DIAGNOSIS — M79672 Pain in left foot: Secondary | ICD-10-CM

## 2018-03-30 NOTE — Patient Instructions (Signed)
Seen for hypertrophic nails. All nails debrided. Return in 3 months or as needed.  

## 2018-03-30 NOTE — Progress Notes (Signed)
Subjective: 84 y.o. year old female patient accompanied by her daughter presents complaining of painful nails. Patient requests toe nails, corns and calluses trimmed. Corn on 4th toe left foot is painful.  Objective: Dermatologic: Thick yellow deformed nails x 10. Painful corn 4th digit left. Vascular: Pedal pulses are all palpable. Orthopedic: Contracted 4th toe left foot.  Neurologic: All epicritic and tactile sensations grossly intact.  Assessment: Dystrophic mycotic nails x 10. Painful corn 4th left foot.  Treatment: All mycotic nails, corns debrided.  Return in 3 months or as needed.

## 2018-03-31 ENCOUNTER — Other Ambulatory Visit: Payer: Medicare Other | Admitting: Internal Medicine

## 2018-04-02 ENCOUNTER — Other Ambulatory Visit: Payer: Self-pay | Admitting: Family Medicine

## 2018-04-03 ENCOUNTER — Other Ambulatory Visit: Payer: Self-pay | Admitting: Family Medicine

## 2018-04-03 DIAGNOSIS — I10 Essential (primary) hypertension: Secondary | ICD-10-CM

## 2018-04-07 ENCOUNTER — Telehealth: Payer: Self-pay | Admitting: Family Medicine

## 2018-04-07 NOTE — Telephone Encounter (Signed)
See note.  Copied from CRM [id number]. Topic: General - Other >> Apr 07, 2018  3:07 PM Eston Mould B wrote: Reason for CRM: PTs daughter called to find out when and why pt was d/ced from home health services, she states the pt is out of pull ups .

## 2018-04-08 NOTE — Telephone Encounter (Signed)
Please advise 

## 2018-04-10 NOTE — Telephone Encounter (Signed)
I don't know the answer to this. Help me find out.

## 2018-04-11 ENCOUNTER — Other Ambulatory Visit: Payer: Self-pay | Admitting: Family Medicine

## 2018-04-11 ENCOUNTER — Telehealth: Payer: Self-pay | Admitting: Family Medicine

## 2018-04-11 DIAGNOSIS — R630 Anorexia: Secondary | ICD-10-CM

## 2018-04-11 NOTE — Telephone Encounter (Signed)
Left message for patients daughter to return call.

## 2018-04-11 NOTE — Telephone Encounter (Signed)
See note

## 2018-04-11 NOTE — Telephone Encounter (Signed)
Yes, restart Metformin. Hydrate. Call if not improving.

## 2018-04-11 NOTE — Telephone Encounter (Signed)
Spoke with patient's daughter and she stated that her mom's blood sugar has been going up. It was at 200 this morning and 190 yesterday. She also stated that her mom has been feeling weak and dizzy. I told her that if her mom continues to feel like this she need to come in to be seen. She is wanting to know if her mom should start back the Metformin due to BS rising.  She also stated that her mom is having the redness in the eyes again. She is wanting know if they should start using drops again. I told her that she had refills so should be fine to start back.    Her mom is also having a lot more pain. She wanted to know if they should increase the prednisone. I told her that it would be better to call her RA doctor for this. She said that her mom took an oxycodone for the pain.

## 2018-04-11 NOTE — Telephone Encounter (Signed)
Copied from CRM [id number]. Topic: Inquiry >> Apr 11, 2018  8:09 AM Yvonna Alanis wrote: Reason for CRM: [name] Para March called concerned over patient's blood sugar. Para March states that Dr. Earlene Plater had taken the patient off of Metformin. But since then the patient's blood sugar has risen. Para March wants a call back from Dr. Earlene Plater at [phone].      Thank You!!!  >> Apr 11, 2018 10:14 AM Rudi Coco, NT wrote: Pt. Daughter returning call to office called 2x no answer (no notes put in about call)

## 2018-04-11 NOTE — Telephone Encounter (Signed)
Spoke with patients daughter and let her know that her mother can start back the Metformin. She will call if her mother is not better.

## 2018-04-11 NOTE — Telephone Encounter (Signed)
See note.  Copied from CRM [id number]. Topic: Inquiry >> Apr 11, 2018  8:09 AM Yvonna Alanis wrote: Reason for CRM: [name] Para March called concerned over patient's blood sugar. Para March states that Dr. Earlene Plater had taken the patient off of Metformin. But since then the patient's blood sugar has risen. Para March wants a call back from Dr. Earlene Plater at [phone].      Thank You!!!

## 2018-04-12 NOTE — Telephone Encounter (Signed)
Spoke with [facility] about the reason they DC'ed service. BLTJQZE the nurse there stated that as for PT she was at her goal and as for nursing her wounds was healed. I have called the patients daughter to inform her of this.

## 2018-04-13 ENCOUNTER — Telehealth: Payer: Self-pay | Admitting: Family Medicine

## 2018-04-13 NOTE — Telephone Encounter (Signed)
See note

## 2018-04-13 NOTE — Telephone Encounter (Signed)
Copied from CRM [id number]. Topic: Quick Communication - See Telephone Encounter >> Apr 13, 2018  4:21 PM Trula Slade wrote: CRM for notification. See Telephone encounter for: 04/13/18 [name], Para March [phone] would like a Health & Physical Form filled out and faxed to [facility], attnJamesetta So  Fax: [phone].

## 2018-04-15 ENCOUNTER — Other Ambulatory Visit: Payer: Medicare Other | Admitting: Internal Medicine

## 2018-04-15 DIAGNOSIS — N183 Type 2 diabetes mellitus with diabetic chronic kidney disease: Secondary | ICD-10-CM

## 2018-04-15 DIAGNOSIS — R531 Weakness: Secondary | ICD-10-CM

## 2018-04-15 DIAGNOSIS — Z794 Long term (current) use of insulin: Secondary | ICD-10-CM

## 2018-04-15 DIAGNOSIS — R52 Pain, unspecified: Secondary | ICD-10-CM

## 2018-04-15 DIAGNOSIS — IMO0001 Reserved for inherently not codable concepts without codable children: Secondary | ICD-10-CM

## 2018-04-15 DIAGNOSIS — E113419 Type 2 diabetes mellitus with severe nonproliferative diabetic retinopathy with macular edema, unspecified eye: Secondary | ICD-10-CM

## 2018-04-15 DIAGNOSIS — R0600 Dyspnea, unspecified: Secondary | ICD-10-CM

## 2018-04-15 DIAGNOSIS — Z515 Encounter for palliative care: Secondary | ICD-10-CM

## 2018-04-15 DIAGNOSIS — M25511 Pain in right shoulder: Secondary | ICD-10-CM

## 2018-04-15 DIAGNOSIS — E1122 Type 2 diabetes mellitus with diabetic chronic kidney disease: Secondary | ICD-10-CM

## 2018-04-15 DIAGNOSIS — R0609 Other forms of dyspnea: Secondary | ICD-10-CM

## 2018-04-17 ENCOUNTER — Encounter: Payer: Self-pay | Admitting: Internal Medicine

## 2018-04-17 NOTE — Progress Notes (Signed)
04/15/2018 PALLIATIVE CARE CONSULT [name]. DOB: [birth year], [address] [place] [address], [address].  REFERRING PROVIDER:  Dr. Helane [name], [name], [place].  Dr. Concepcion [name]  FAMILY: dtrCharlann [name] [phone], [phone], dtr [name] [name] Surgeon [phone], granddaughter [name] [name] [phone], granddaughter [name] Surgeon [phone]  BILLABLE  ICD-10: IMPRESSION/RECOMMENDATIONS: 1. Dyspnea r/t deconditioning/comorbid illnesses: -Episodic exacerbation depending on weather humidity/level of fatigue/pain; baseline shortness of breath when ambulating to kitchen/bathroom. Sleeps with HOB elevated more for comfort rather than orthopnea.  2. Weakness/gait imbalance due to deconditioning/comorbid medical illnesses (rheumatoid arthritis/right shoulder adhesive capsulitis): -Limited in ADLs b/c of RUE stiffness. Needs assist with bathing/dressing/cooking. Fatigued after ambulating 40 ft with rollator walker. Occasional use of cane. Wheel chair available for outings. Energy level varies day to day. Easily drops items. Follows exercise regimen left by physical therapist when able.   3. PAIN r/t right shoulder ("shattered"; patient declined surgical repair),  -Right shoulder pain with extension to right forearm; orthopedic surgeon reported shoulder is shattered; patient declined surgery. Plan injections/prednisone taper. Given referral to pain management by rheumatology. Pan adequately managed with tramadol 2 tabs bid (occasionally tid), oxycodone prn (averages 1 tab q2-3 wks; avoids b/c of SE fatique). MTX once a week.  a. Discussed adding Tylenol to regimen; follow labeled package instructions.  4. LE swelling: -Dependent; improved with elevation.  5. Hyperglycemia of DM 2. -checks BS qd; ranges 10-120's. Notes improved control with addition of Metformin back to the regimen.  6. Care Management: - Has home health aide through [name]  Staff 3h/f M-F and 2h/d weekends. Family considering adding additional evening coverage as well. DME equipment [name] bed, rollator walker, cane, transfer shower bench. [name] checks in on daily. Patient currently lives alone in her own apartment. Patient/family considering patient moving to [name] to live in home of her other [name], for closer monitoring/assistance.  7. Advanced Care Planning:  Discussed with patient and [name]. Patient desires CPR, but doesn't wish to be intubated for extended period of time if seems will not be able to extubated soon thereafter. We discussed MOST form in brief. They wish to discuss with other family members before addressing. I left copy of the form in the home, with accompanying written educational materal.   8. Follow up:  a. On a prn basis; care well managed at home. Daughter will review MOST form with other family members and call us to arrange NP to review/sign when they wish to complete.  I spent 75 minutes providing this consultation (from 1pm to 2:15pm). More than 50% of that time was spent coordinating communication.  HPI: 84 yo female, with CHF- stable, and Rheumtoid Arthritis, HTN, adhesive capsulitis of right shoulder, pain issues. Referred to Palliative Care for assistance with symptom management, help with coordination of resources, and ongoing discussions regarding goals of care/advanced directives  CODE STATUS:  PPS: 50%.  HOSPICE ELIGIBILITY:  no  MEDICATIONS :  albuterol MDI, allopurinol 300mg   tab qhs, aspirin 81mg  qd, oscal with D, chlorophyll 5 grrs qd, voltaren gel, ferrous sulfate 325qd, Advair Diskus bid, lasix 29mg  qod, Neurontin 100mg  qd, hydralazine 100mg  ti, normodyne 100mg  2 tabs bid, Lisinopril 2.5mg  qd, Glucophage 50mg  qd, methotrexate 2.5mg  4 tabs once a week, oxycodone 5mg  q4h prn, prednisone 10mg  qd, zantac 150mg  qhs, tramadol 50mg  1-2 tabs q6h prn,  ALLERGIES: clonidine, fish,  shellfish, doxycycline, indomethacin, lyrica, methyldopa, orange fruit, cetirizine, codeine, Levaquin, tomato  PAST MEDICAL HISTORY: Acute on chronic diastolic heart failure, CKD stage 3, DM type 2/real complications, Rheumatoid arthritis, HTN, Diarrhea, hypokalemia, systemic inflammatory response syndrome (SIRS), adhesive capsulitis of right shoulder, idiopathic chronic gout/tophus, rheumatic nodule, bilateral knee replacement, rotator cuff surgery, obesity  PHYSICAL EXAM: Obese AA female sitting in chair. No distress. Alert and pleasantly conversant. VS: BP 140/78, HR 80, RR 16 HEENT: [place], AT LUNGS:  CTA bilaterally CARDIAC:  RRR, soft systolic murmur with atrial gallop. Peripheral pulses intact. ABD: obese, protrudent, NABS, EXTREMITIES:  no contractures; mildly softly pitting edema to mid shin bilaterally MUSCULOSKELETAL: Limited ROM bilateral upper arms right more so than left. SKIN:  Well healed areas of prior LE/shin ulcers NEURO:  A & O X 3  [name] Bodily NP-C

## 2018-04-18 NOTE — Telephone Encounter (Signed)
Left message for patient's daughter to return call. We do not have a form to fill out so need more information to determine what needs to be on form.

## 2018-04-20 DIAGNOSIS — H40013 Open angle with borderline findings, low risk, bilateral: Secondary | ICD-10-CM | POA: Diagnosis not present

## 2018-04-20 DIAGNOSIS — H0100A Unspecified blepharitis right eye, upper and lower eyelids: Secondary | ICD-10-CM | POA: Diagnosis not present

## 2018-04-20 DIAGNOSIS — H04123 Dry eye syndrome of bilateral lacrimal glands: Secondary | ICD-10-CM | POA: Diagnosis not present

## 2018-04-20 DIAGNOSIS — H0100B Unspecified blepharitis left eye, upper and lower eyelids: Secondary | ICD-10-CM | POA: Diagnosis not present

## 2018-04-20 DIAGNOSIS — H35373 Puckering of macula, bilateral: Secondary | ICD-10-CM | POA: Diagnosis not present

## 2018-04-26 DIAGNOSIS — Z79899 Other long term (current) drug therapy: Secondary | ICD-10-CM | POA: Diagnosis not present

## 2018-04-26 DIAGNOSIS — Z13820 Encounter for screening for osteoporosis: Secondary | ICD-10-CM | POA: Diagnosis not present

## 2018-04-26 DIAGNOSIS — M069 Rheumatoid arthritis, unspecified: Secondary | ICD-10-CM | POA: Diagnosis not present

## 2018-04-26 DIAGNOSIS — M1A09X1 Idiopathic chronic gout, multiple sites, with tophus (tophi): Secondary | ICD-10-CM | POA: Diagnosis not present

## 2018-04-27 NOTE — Telephone Encounter (Signed)
Daughter never returned call.

## 2018-05-06 DIAGNOSIS — M1A09X1 Idiopathic chronic gout, multiple sites, with tophus (tophi): Secondary | ICD-10-CM | POA: Diagnosis not present

## 2018-05-06 DIAGNOSIS — Z79899 Other long term (current) drug therapy: Secondary | ICD-10-CM | POA: Diagnosis not present

## 2018-05-06 DIAGNOSIS — M1990 Unspecified osteoarthritis, unspecified site: Secondary | ICD-10-CM | POA: Insufficient documentation

## 2018-05-06 DIAGNOSIS — M153 Secondary multiple arthritis: Secondary | ICD-10-CM | POA: Diagnosis not present

## 2018-05-06 DIAGNOSIS — M069 Rheumatoid arthritis, unspecified: Secondary | ICD-10-CM | POA: Diagnosis not present

## 2018-05-09 DIAGNOSIS — J189 Pneumonia, unspecified organism: Secondary | ICD-10-CM

## 2018-05-09 HISTORY — DX: Pneumonia, unspecified organism: J18.9

## 2018-05-15 ENCOUNTER — Encounter: Payer: Self-pay | Admitting: Family Medicine

## 2018-05-16 ENCOUNTER — Telehealth: Payer: Self-pay | Admitting: Family Medicine

## 2018-05-16 NOTE — Telephone Encounter (Signed)
Left message to return call to our office.  

## 2018-05-16 NOTE — Telephone Encounter (Signed)
[name] Lattie Corns came in asking for clarification on her mother's medications. She states her mother is not taking her heart pills and had swelling in her feet and legs over the weekend. She states her mom "had a rough weekend" as she didn't feel good. Para March states that she would like to know if she should get a new prescription or if she should just not take them. She also states that she was advised that if her mom has swelling that she should alert her doctor as it has to do with her heart and last time this happened she was admitted to the hospital. Lattie Corns can be reached at [phone].

## 2018-05-17 ENCOUNTER — Other Ambulatory Visit: Payer: Self-pay

## 2018-05-17 ENCOUNTER — Other Ambulatory Visit: Payer: Self-pay | Admitting: Family Medicine

## 2018-05-17 DIAGNOSIS — M0609 Rheumatoid arthritis without rheumatoid factor, multiple sites: Secondary | ICD-10-CM

## 2018-05-17 DIAGNOSIS — I10 Essential (primary) hypertension: Secondary | ICD-10-CM

## 2018-05-17 MED ORDER — POTASSIUM CHLORIDE ER 20 MEQ PO TBCR: 20.0000 meq | EXTENDED_RELEASE_TABLET | Freq: Every day | ORAL | 5 refills | Status: DC

## 2018-05-17 MED ORDER — FUROSEMIDE 20 MG PO TABS: 20.0000 mg | ORAL_TABLET | ORAL | 5 refills | Status: DC

## 2018-05-17 NOTE — Telephone Encounter (Signed)
MEDICATION:  traMADol (ULTRAM) 50 MG tablet  PHARMACY:   [facility] [phone] - [place],  - [phone] (Phone) [phone] (Fax)       IS THIS A 90 DAY SUPPLY : Y  IS PATIENT OUT OF MEDICATION: Y  IF NOT; HOW MUCH IS LEFT:   LAST APPOINTMENT DATE: @7 /07/2018  NEXT APPOINTMENT DATE:@Visit  date not found  OTHER COMMENTS:    **Let patient know to contact pharmacy at the end of the day to make sure medication is ready. **  ** Please notify patient to allow 48-72 hours to process**  **Encourage patient to contact the pharmacy for refills or they can request refills through [facility]**

## 2018-05-17 NOTE — Telephone Encounter (Signed)
Last app 03/14/18 No f/u  Last script 03/21/18 #240 no rf

## 2018-05-17 NOTE — Telephone Encounter (Signed)
Please advise on refill. I have pended the Tramadol for you.

## 2018-05-17 NOTE — Telephone Encounter (Signed)
Called daughter she has not been taking Lasix. I have called in refill as well as script for potassium. FL2 filled out and copy sent to scan. Patient daughter informed ready for pick up. Placed at reception for pick up.

## 2018-05-19 ENCOUNTER — Other Ambulatory Visit: Payer: Self-pay

## 2018-05-19 DIAGNOSIS — S81801D Unspecified open wound, right lower leg, subsequent encounter: Secondary | ICD-10-CM

## 2018-05-19 MED ORDER — GABAPENTIN 100 MG PO CAPS: 100.0000 mg | ORAL_CAPSULE | Freq: Every day | ORAL | 3 refills | Status: DC

## 2018-05-21 ENCOUNTER — Other Ambulatory Visit: Payer: Self-pay | Admitting: Family Medicine

## 2018-05-21 DIAGNOSIS — M0609 Rheumatoid arthritis without rheumatoid factor, multiple sites: Secondary | ICD-10-CM

## 2018-05-24 ENCOUNTER — Encounter (HOSPITAL_COMMUNITY): Payer: Self-pay | Admitting: Emergency Medicine

## 2018-05-24 ENCOUNTER — Emergency Department (HOSPITAL_COMMUNITY)
Admission: EM | Admit: 2018-05-24 | Discharge: 2018-05-24 | Disposition: A | Discharge status: Home / Self Care | Payer: Medicare Other | Attending: Emergency Medicine | Admitting: Emergency Medicine

## 2018-05-24 DIAGNOSIS — M069 Rheumatoid arthritis, unspecified: Secondary | ICD-10-CM | POA: Diagnosis not present

## 2018-05-24 DIAGNOSIS — I130 Hypertensive heart and chronic kidney disease with heart failure and stage 1 through stage 4 chronic kidney disease, or unspecified chronic kidney disease: Secondary | ICD-10-CM | POA: Insufficient documentation

## 2018-05-24 DIAGNOSIS — I2510 Atherosclerotic heart disease of native coronary artery without angina pectoris: Secondary | ICD-10-CM | POA: Diagnosis not present

## 2018-05-24 DIAGNOSIS — Z7982 Long term (current) use of aspirin: Secondary | ICD-10-CM | POA: Diagnosis not present

## 2018-05-24 DIAGNOSIS — I252 Old myocardial infarction: Secondary | ICD-10-CM | POA: Diagnosis not present

## 2018-05-24 DIAGNOSIS — Z8673 Personal history of transient ischemic attack (TIA), and cerebral infarction without residual deficits: Secondary | ICD-10-CM | POA: Diagnosis not present

## 2018-05-24 DIAGNOSIS — I5032 Chronic diastolic (congestive) heart failure: Secondary | ICD-10-CM | POA: Insufficient documentation

## 2018-05-24 DIAGNOSIS — Z7984 Long term (current) use of oral hypoglycemic drugs: Secondary | ICD-10-CM | POA: Diagnosis not present

## 2018-05-24 DIAGNOSIS — Z79899 Other long term (current) drug therapy: Secondary | ICD-10-CM | POA: Insufficient documentation

## 2018-05-24 DIAGNOSIS — E1122 Type 2 diabetes mellitus with diabetic chronic kidney disease: Secondary | ICD-10-CM | POA: Insufficient documentation

## 2018-05-24 DIAGNOSIS — R531 Weakness: Secondary | ICD-10-CM | POA: Insufficient documentation

## 2018-05-24 DIAGNOSIS — N183 Chronic kidney disease, stage 3 (moderate): Secondary | ICD-10-CM | POA: Insufficient documentation

## 2018-05-24 LAB — CBC
HCT: 33.4 % — ABNORMAL LOW (ref 36.0–46.0)
Hemoglobin: 10.7 g/dL — ABNORMAL LOW (ref 12.0–15.0)
MCH: 30.6 pg (ref 26.0–34.0)
MCHC: 32 g/dL (ref 30.0–36.0)
MCV: 95.4 fL (ref 78.0–100.0)
Platelets: 340 10*3/uL (ref 150–400)
RBC: 3.5 MIL/uL — ABNORMAL LOW (ref 3.87–5.11)
RDW: 18.3 % — ABNORMAL HIGH (ref 11.5–15.5)
WBC: 9.6 10*3/uL (ref 4.0–10.5)

## 2018-05-24 LAB — URINALYSIS, ROUTINE W REFLEX MICROSCOPIC
Bacteria, UA: NONE SEEN
Bilirubin Urine: NEGATIVE
Glucose, UA: NEGATIVE mg/dL
Hgb urine dipstick: NEGATIVE
Ketones, ur: NEGATIVE mg/dL
Leukocytes, UA: NEGATIVE
Nitrite: NEGATIVE
Protein, ur: 30 mg/dL — AB
Specific Gravity, Urine: 1.018 (ref 1.005–1.030)
pH: 5 (ref 5.0–8.0)

## 2018-05-24 LAB — BASIC METABOLIC PANEL
Anion gap: 9 (ref 5–15)
BUN: 21 mg/dL (ref 8–23)
CO2: 25 mmol/L (ref 22–32)
Calcium: 8.8 mg/dL — ABNORMAL LOW (ref 8.9–10.3)
Chloride: 107 mmol/L (ref 98–111)
Creatinine, Ser: 0.91 mg/dL (ref 0.44–1.00)
GFR calc Af Amer: >60 mL/min (ref >60)
GFR calc non Af Amer: 56 mL/min — ABNORMAL LOW (ref >60)
Glucose, Bld: 126 mg/dL — ABNORMAL HIGH (ref 70–99)
Potassium: 3.8 mmol/L (ref 3.5–5.1)
Sodium: 141 mmol/L (ref 135–145)

## 2018-05-24 LAB — CBG MONITORING, ED: Glucose-Capillary: 115 mg/dL — ABNORMAL HIGH (ref 70–99)

## 2018-05-24 MED ORDER — TRAMADOL HCL 50 MG PO TABS
50.0000 mg | ORAL_TABLET | Freq: Once | ORAL | Status: AC
  Administered 2018-05-24: 50 mg via ORAL
  Filled 2018-05-24: qty 1

## 2018-05-24 NOTE — ED Notes (Signed)
Refused iv

## 2018-05-24 NOTE — ED Notes (Signed)
EKG given to EDP,Delo,MD., for review. 

## 2018-05-24 NOTE — ED Provider Notes (Signed)
[facility] DEPT Provider Note   CSN: [id number] Arrival date & time: 05/24/18  0147     History   Chief Complaint Chief Complaint  Patient presents with  . Weakness    HPI [name] [name] is a 84 y.o. female.  Patient is an 84 year old female with history of rheumatoid arthritis brought by EMS for evaluation of weakness.  Over the past 2 days, she has been less mobile and less steady than normal.  The daughter says that she gets this way when she is under stress or there may be an infection coming.  Patient denies to me she is experiencing any specific aches or pains outside of her rheumatoid.  She denies any fevers or chills.     Past Medical History:  Diagnosis Date  . Allergic rhinitis due to pollen 04/27/2007  . Arthritis    "in q joint" (08/07/2013)  . Coronary atherosclerosis of native coronary artery   . Cough   . Disorder of bone and cartilage, unspecified   . Edema 05/03/2013  . Exertional shortness of breath    "sometimes" (08/07/2013)  . First degree atrioventricular block   . Gout, unspecified 04/19/2013  . Hyperlipidemia 04/27/2007  . Hypertension   . Muscle spasm   . Muscle weakness (generalized)   . Myocardial infarction ([place]) ~ 1970  . Onychia and paronychia of toe   . Osteoarthrosis, unspecified whether generalized or localized, unspecified site 04/27/2007  . Other fall   . Other malaise and fatigue   . Prepatellar bursitis   . Seizures ([place])   . Stroke ([place]) 01/17/2014  . TIA (transient ischemic attack)    "a few one summer" (08/07/2013)  . Type II diabetes mellitus ([place])    "fasting 90-110s" (08/07/2013)  . Unspecified essential hypertension 08/08/2013  . Unspecified vitamin D deficiency     Patient Active Problem List   Diagnosis Date Noted  . Diarrhea 02/02/2018  . Hypokalemia 02/02/2018  . SIRS (systemic inflammatory response syndrome) ([place]) 11/26/2017  . Acute pain of right shoulder 10/12/2017  .  Adhesive capsulitis of right shoulder 10/12/2017  . Idiopathic chronic gout of multiple sites with tophus 08/25/2017  . High risk medication use 08/25/2017  . Rheumatic nodule 06/28/2017  . History of CHF (congestive heart failure) 02/18/2017  . History of total knee arthroplasty, bilateral 02/18/2017  . History of rotator cuff surgery 02/18/2017  . Obesity (BMI 30.0-34.9) 12/08/2016  . High risk medications (not anticoagulants) long-term use 09/21/2016  . Hammer toe 10/31/2015  . Acute on chronic diastolic heart failure ([place]) 09/12/2015  . Onychomycosis 04/24/2015  . Midline low back pain without sciatica 06/27/2014  . Bilateral edema of lower extremity 06/05/2014  . CKD (chronic kidney disease) stage 3, GFR 30-59 ml/min ([facility]) 06/05/2014  . DM (diabetes mellitus), type 2 with renal complications ([place]) 27/25/3664    Class: Chronic  . Anemia of chronic disease 05/01/2014  . Fluctuating blood pressure 02/06/2014  . Stroke ([place]) 01/17/2014  . Rheumatoid arthritis involving multiple sites ([place]) 12/19/2013    Class: Chronic  . CAD (coronary artery disease) 10/18/2013  . HTN (hypertension) 08/08/2013    Class: Chronic  . Fibromyalgia 05/10/2013  . Reactive airway disease 04/19/2013  . Gout 04/19/2013  . Hyperlipidemia 04/27/2007  . Allergic rhinitis 04/27/2007  . Diverticulosis 04/27/2007    Past Surgical History:  Procedure Laterality Date  . ABDOMINAL HYSTERECTOMY  04/1980  . APPENDECTOMY  1960  . CARDIAC CATHETERIZATION  2003  . CATARACT EXTRACTION  W/ INTRAOCULAR LENS  IMPLANT, BILATERAL Bilateral ~ 2012  . JOINT REPLACEMENT    . REPLACEMENT TOTAL KNEE Right 09/2005  . SHOULDER OPEN ROTATOR CUFF REPAIR Right 07/1999  . TONSILLECTOMY  08/1974  . TOTAL KNEE ARTHROPLASTY Left 08/07/2013   Procedure: TOTAL KNEE ARTHROPLASTY- LEFT;  Surgeon: Vickey Huger, MD;  Location: [facility];  Service: Orthopedics;  Laterality: Left;  . TRANSTHORACIC ECHOCARDIOGRAM  2003   EF 55-65%; mild  concentric LVH     OB History   None      Home Medications    Prior to Admission medications   Medication Sig Start Date End Date Taking? Authorizing Provider  albuterol (PROVENTIL HFA;VENTOLIN HFA) 108 (90 Base) MCG/ACT inhaler Inhale 1 puff into the lungs every 6 (six) hours as needed for wheezing or shortness of breath.   Yes [provider]  allopurinol (ZYLOPRIM) 100 MG tablet Take 200 mg by mouth daily.   Yes [provider]  aspirin EC 81 MG tablet Take 81 mg by mouth daily.   Yes [provider]  calcium-vitamin D (OSCAL WITH D) 500-200 MG-UNIT per tablet Take 1 tablet by mouth daily.    Yes [provider]  cholecalciferol (VITAMIN D) 1000 units tablet Take 1,000 Units by mouth daily.   Yes [provider]  diclofenac sodium (VOLTAREN) 1 % GEL Apply 1 application topically daily. 02/22/18  Yes Briscoe Deutscher, DO  ferrous sulfate 325 (65 FE) MG tablet Take 325 mg by mouth daily.    Yes [provider]  folic acid (FOLVITE) 1 MG tablet Take 1 mg by mouth daily.   Yes [provider]  furosemide (LASIX) 20 MG tablet Take 1 tablet (20 mg total) by mouth every other day. 05/17/18  Yes Briscoe Deutscher, DO  gabapentin (NEURONTIN) 100 MG capsule Take 1 capsule (100 mg total) by mouth at bedtime. 05/19/18  Yes Briscoe Deutscher, DO  hydrALAZINE (APRESOLINE) 100 MG tablet TAKE 1 TABLET BY MOUTH THREE TIMES DAILY 04/05/18  Yes Briscoe Deutscher, DO  hydroxychloroquine (PLAQUENIL) 200 MG tablet Take 200 mg by mouth 2 (two) times daily.   Yes [provider]  hydroxypropyl methylcellulose / hypromellose (ISOPTO TEARS / GONIOVISC) 2.5 % ophthalmic solution Place 1 drop into both eyes 4 (four) times daily as needed for dry eyes. 07/07/17  Yes Briscoe Deutscher, DO  labetalol (NORMODYNE) 100 MG tablet TAKE 2 TABLETS BY MOUTH TWICE A DAY 04/06/18  Yes Briscoe Deutscher, DO  leflunomide (ARAVA) 20 MG tablet Take 20 mg by mouth daily.  05/20/18  Yes  [provider]  lisinopril (PRINIVIL,ZESTRIL) 30 MG tablet Take 30 mg by mouth daily.   Yes [provider]  metFORMIN (GLUCOPHAGE) 500 MG tablet Take 1 tablet (500 mg total) by mouth daily with breakfast. 01/28/18  Yes Briscoe Deutscher, DO  Multiple Vitamins-Minerals (ALIVE ONCE DAILY WOMENS PO) Take 15 mLs by mouth every other day.    Yes [provider]  potassium chloride 20 MEQ TBCR Take 20 mEq by mouth daily. 05/17/18  Yes Briscoe Deutscher, DO  predniSONE (DELTASONE) 10 MG tablet Take 1 tablet (10 mg total) by mouth daily with breakfast. 03/25/18  Yes Briscoe Deutscher, DO  ranitidine (ZANTAC) 150 MG tablet TAKE 1 TABLET(150 MG) BY MOUTH AT BEDTIME 04/11/18  Yes Briscoe Deutscher, DO  traMADol (ULTRAM) 50 MG tablet TAKE 1 TO 2 TABLETS BY MOUTH EVERY 6 HOURS AS NEEDED FOR PAIN Patient taking differently: Take 50-100 mg by mouth every 6 (six) hours  as needed for moderate pain.  05/18/18  Yes Briscoe Deutscher, DO  triamcinolone cream (KENALOG) 0.1 % Apply 1 application topically 2 (two) times daily.   Yes [provider]  vitamin C (ASCORBIC ACID) 500 MG tablet Take 500 mg by mouth every other day.    Yes [provider]  allopurinol (ZYLOPRIM) 300 MG tablet Take 0.5 tablets (150 mg total) by mouth every evening. 11/29/17 02/02/18  Elodia Florence., MD  blood glucose meter kit and supplies KIT Dispense based on patient and insurance preference. Use up to four times daily as directed. (FOR ICD-9 250.00, 250.01). 07/30/17   Briscoe Deutscher, DO  CHLOROPHYLL PO Take 5 drops by mouth See admin instructions. MIX INTO 6 OUNCES OF JUICE AND DRINK ONCE A DAY    [provider]  glucose blood test strip Test QID dx code E11.9 12/24/17   Briscoe Deutscher, DO  lisinopril (PRINIVIL,ZESTRIL) 2.5 MG tablet Take 1 tablet (2.5 mg total) by mouth daily. Patient not taking: Reported on 05/24/2018 02/04/18   Donne Tiawana, MD  methotrexate (RHEUMATREX) 2.5 MG tablet TAKE 4  TABLETS(10 MG) BY MOUTH 1 TIME A WEEK Patient not taking: Reported on 05/24/2018 03/21/18   Briscoe Deutscher, DO  Olopatadine HCl 0.2 % SOLN Apply 1 drop to eye daily. Patient not taking: Reported on 05/24/2018 02/22/18   Briscoe Deutscher, DO  oxycodone (OXY-IR) 5 MG capsule Take 1 capsule (5 mg total) by mouth every 4 (four) hours as needed for pain. Patient not taking: Reported on 05/24/2018 12/03/17   Briscoe Deutscher, DO  oxyCODONE-acetaminophen (PERCOCET/ROXICET) 5-325 MG tablet Take 1 tablet by mouth every 4 (four) hours as needed for severe pain. Patient not taking: Reported on 05/24/2018 03/14/18   Briscoe Deutscher, DO  predniSONE (DELTASONE) 10 MG tablet Take 1 tablet (10 mg total) by mouth daily with breakfast. Patient not taking: Reported on 05/24/2018 02/22/18   Briscoe Deutscher, DO    Family History Family History  Problem Relation Age of Onset  . Heart attack Father     Social History Social History   Tobacco Use  . Smoking status: Never Smoker  . Smokeless tobacco: Never Used  Substance Use Topics  . Alcohol use: No    Alcohol/week: 0.0 oz  . Drug use: No     Allergies   Clonidine derivatives; Fish allergy; Shellfish allergy; Doxycycline; Indomethacin; Lyrica [pregabalin]; Methyldopa; Orange fruit [citrus]; Cetirizine hcl; Codeine; Levaquin [levofloxacin in d5w]; and Tomato   Review of Systems Review of Systems  All other systems reviewed and are negative.    Physical Exam Updated Vital Signs BP (!) 148/80 (BP Location: Left Arm)   Pulse 97   Temp 98.3 F (36.8 C) (Oral)   Resp 16   SpO2 97%   Physical Exam  Constitutional: She is oriented to person, place, and time. She appears well-developed and well-nourished. No distress.  HENT:  Head: Normocephalic and atraumatic.  Neck: Normal range of motion. Neck supple.  Cardiovascular: Normal rate and regular rhythm. Exam reveals no gallop and no friction rub.  No murmur heard. Pulmonary/Chest: Effort normal and breath  sounds normal. No respiratory distress. She has no wheezes.  Abdominal: Soft. Bowel sounds are normal. She exhibits no distension. There is no tenderness.  Musculoskeletal: Normal range of motion.  Neurological: She is alert and oriented to person, place, and time.  Skin: Skin is warm and dry. She is not diaphoretic.  Nursing note and vitals reviewed.    ED Treatments /  Results  Labs (all labs ordered are listed, but only abnormal results are displayed) Labs Reviewed  BASIC METABOLIC PANEL - Abnormal; Notable for the following components:      Result Value   Glucose, Bld 126 (*)    Calcium 8.8 (*)    GFR calc non Af Amer 56 (*)    All other components within normal limits  CBC - Abnormal; Notable for the following components:   RBC 3.50 (*)    Hemoglobin 10.7 (*)    HCT 33.4 (*)    RDW 18.3 (*)    All other components within normal limits  URINALYSIS, ROUTINE W REFLEX MICROSCOPIC - Abnormal; Notable for the following components:   Protein, ur 30 (*)    All other components within normal limits  CBG MONITORING, ED - Abnormal; Notable for the following components:   Glucose-Capillary 115 (*)    All other components within normal limits  CBG MONITORING, ED    EKG EKG Interpretation  Date/Time:  Tuesday May 24 2018 02:07:07 EDT Ventricular Rate:  90 PR Interval:    QRS Duration: 87 QT Interval:  388 QTC Calculation: 475 R Axis:   -5 Text Interpretation:  Sinus rhythm Ventricular premature complex Abnormal R-wave progression, late transition Consider left ventricular hypertrophy Baseline wander in lead(s) V2 Confirmed by Veryl Speak [phone]) on 05/24/2018 2:41:01 AM Also confirmed by Veryl Speak [phone]), editor Philomena Doheny [phone])  on 05/24/2018 6:58:18 AM   Radiology No results found.  Procedures Procedures (including critical care time)  Medications Ordered in ED Medications  traMADol (ULTRAM) tablet 50 mg (50 mg Oral Given 05/24/18 0334)     Initial  Impression / Assessment and Plan / ED Course  I have reviewed the triage vital signs and the nursing notes.  Pertinent labs & imaging results that were available during my care of the patient were reviewed by me and considered in my medical decision making (see chart for details).  [name] work-up is essentially unremarkable.  Laboratory studies and urinalysis showed no obvious cause.  Her EKG shows a sinus rhythm.  I have discussed these findings with the daughter who is at bedside.  She is comfortable with her being discharged.  The patient will be discharged home, to return as needed for any problems.  Final Clinical Impressions(s) / ED Diagnoses   Final diagnoses:  None    ED Discharge Orders    None       Veryl Speak, MD 05/24/18 [phone]

## 2018-05-24 NOTE — ED Triage Notes (Signed)
Pt comes to ed via ems, weakness x 2 days. Pt started on a new medication Arava on Friday. V/s on arrival bp 156/86, hr 86, rr16, spo2 96. cbg 129,  Alert x 4, daughter on the way.  States she usually gets weak when a infection is coming on. Poly allergies. [employer]

## 2018-05-24 NOTE — Discharge Instructions (Addendum)
Continue medications as previously prescribed.  Return to the emergency department for worsening symptoms, pain, high fever, or other new and concerning symptoms.

## 2018-05-24 NOTE — ED Notes (Signed)
Bed: AS50 Expected date:  Expected time:  Means of arrival:  Comments: 84 yo F/ weakness

## 2018-05-26 ENCOUNTER — Telehealth: Payer: Self-pay

## 2018-05-26 NOTE — Telephone Encounter (Signed)
Called patient daughter reviewed the corrections that need to be made to form. New one filled out and put at reception with copy of med list. I have sent copy to scan.

## 2018-05-28 ENCOUNTER — Emergency Department (HOSPITAL_COMMUNITY): Payer: Medicare Other

## 2018-05-28 ENCOUNTER — Inpatient Hospital Stay (HOSPITAL_COMMUNITY)
Admission: EM | Admit: 2018-05-28 | Discharge: 2018-06-04 | DRG: 286 | Disposition: A | Discharge status: Skilled Nursing Facility | Payer: Medicare Other | Attending: Internal Medicine | Admitting: Internal Medicine

## 2018-05-28 ENCOUNTER — Other Ambulatory Visit: Payer: Self-pay

## 2018-05-28 DIAGNOSIS — R0602 Shortness of breath: Secondary | ICD-10-CM | POA: Diagnosis not present

## 2018-05-28 DIAGNOSIS — N183 Chronic kidney disease, stage 3 unspecified: Secondary | ICD-10-CM

## 2018-05-28 DIAGNOSIS — Z96653 Presence of artificial knee joint, bilateral: Secondary | ICD-10-CM | POA: Diagnosis present

## 2018-05-28 DIAGNOSIS — E669 Obesity, unspecified: Secondary | ICD-10-CM | POA: Diagnosis present

## 2018-05-28 DIAGNOSIS — Z881 Allergy status to other antibiotic agents status: Secondary | ICD-10-CM

## 2018-05-28 DIAGNOSIS — I420 Dilated cardiomyopathy: Secondary | ICD-10-CM | POA: Diagnosis not present

## 2018-05-28 DIAGNOSIS — G8929 Other chronic pain: Secondary | ICD-10-CM | POA: Diagnosis present

## 2018-05-28 DIAGNOSIS — I160 Hypertensive urgency: Secondary | ICD-10-CM | POA: Diagnosis not present

## 2018-05-28 DIAGNOSIS — N179 Acute kidney failure, unspecified: Secondary | ICD-10-CM | POA: Diagnosis present

## 2018-05-28 DIAGNOSIS — R410 Disorientation, unspecified: Secondary | ICD-10-CM | POA: Diagnosis not present

## 2018-05-28 DIAGNOSIS — D509 Iron deficiency anemia, unspecified: Secondary | ICD-10-CM | POA: Diagnosis present

## 2018-05-28 DIAGNOSIS — Z8673 Personal history of transient ischemic attack (TIA), and cerebral infarction without residual deficits: Secondary | ICD-10-CM

## 2018-05-28 DIAGNOSIS — R7989 Other specified abnormal findings of blood chemistry: Secondary | ICD-10-CM | POA: Diagnosis not present

## 2018-05-28 DIAGNOSIS — E785 Hyperlipidemia, unspecified: Secondary | ICD-10-CM | POA: Diagnosis not present

## 2018-05-28 DIAGNOSIS — I5023 Acute on chronic systolic (congestive) heart failure: Secondary | ICD-10-CM | POA: Diagnosis not present

## 2018-05-28 DIAGNOSIS — N182 Chronic kidney disease, stage 2 (mild): Secondary | ICD-10-CM | POA: Diagnosis present

## 2018-05-28 DIAGNOSIS — E1122 Type 2 diabetes mellitus with diabetic chronic kidney disease: Secondary | ICD-10-CM | POA: Diagnosis present

## 2018-05-28 DIAGNOSIS — Z91013 Allergy to seafood: Secondary | ICD-10-CM | POA: Diagnosis not present

## 2018-05-28 DIAGNOSIS — I130 Hypertensive heart and chronic kidney disease with heart failure and stage 1 through stage 4 chronic kidney disease, or unspecified chronic kidney disease: Principal | ICD-10-CM

## 2018-05-28 DIAGNOSIS — I2510 Atherosclerotic heart disease of native coronary artery without angina pectoris: Secondary | ICD-10-CM | POA: Diagnosis present

## 2018-05-28 DIAGNOSIS — R748 Abnormal levels of other serum enzymes: Secondary | ICD-10-CM | POA: Diagnosis not present

## 2018-05-28 DIAGNOSIS — I10 Essential (primary) hypertension: Secondary | ICD-10-CM

## 2018-05-28 DIAGNOSIS — J9811 Atelectasis: Secondary | ICD-10-CM | POA: Diagnosis not present

## 2018-05-28 DIAGNOSIS — I5021 Acute systolic (congestive) heart failure: Secondary | ICD-10-CM | POA: Diagnosis not present

## 2018-05-28 DIAGNOSIS — J181 Lobar pneumonia, unspecified organism: Secondary | ICD-10-CM | POA: Diagnosis present

## 2018-05-28 DIAGNOSIS — Z888 Allergy status to other drugs, medicaments and biological substances status: Secondary | ICD-10-CM | POA: Diagnosis not present

## 2018-05-28 DIAGNOSIS — Q211 Atrial septal defect: Secondary | ICD-10-CM

## 2018-05-28 DIAGNOSIS — R9431 Abnormal electrocardiogram [ECG] [EKG]: Secondary | ICD-10-CM | POA: Diagnosis not present

## 2018-05-28 DIAGNOSIS — I493 Ventricular premature depolarization: Secondary | ICD-10-CM | POA: Diagnosis present

## 2018-05-28 DIAGNOSIS — R29700 NIHSS score 0: Secondary | ICD-10-CM | POA: Diagnosis not present

## 2018-05-28 DIAGNOSIS — M069 Rheumatoid arthritis, unspecified: Secondary | ICD-10-CM | POA: Diagnosis present

## 2018-05-28 DIAGNOSIS — R0902 Hypoxemia: Secondary | ICD-10-CM | POA: Diagnosis not present

## 2018-05-28 DIAGNOSIS — I129 Hypertensive chronic kidney disease with stage 1 through stage 4 chronic kidney disease, or unspecified chronic kidney disease: Secondary | ICD-10-CM | POA: Diagnosis not present

## 2018-05-28 DIAGNOSIS — D631 Anemia in chronic kidney disease: Secondary | ICD-10-CM | POA: Diagnosis not present

## 2018-05-28 DIAGNOSIS — J189 Pneumonia, unspecified organism: Secondary | ICD-10-CM

## 2018-05-28 DIAGNOSIS — E559 Vitamin D deficiency, unspecified: Secondary | ICD-10-CM | POA: Diagnosis present

## 2018-05-28 DIAGNOSIS — J301 Allergic rhinitis due to pollen: Secondary | ICD-10-CM | POA: Diagnosis present

## 2018-05-28 DIAGNOSIS — I440 Atrioventricular block, first degree: Secondary | ICD-10-CM | POA: Diagnosis present

## 2018-05-28 DIAGNOSIS — J168 Pneumonia due to other specified infectious organisms: Secondary | ICD-10-CM | POA: Diagnosis not present

## 2018-05-28 DIAGNOSIS — Z7984 Long term (current) use of oral hypoglycemic drugs: Secondary | ICD-10-CM

## 2018-05-28 DIAGNOSIS — Z6833 Body mass index (BMI) 33.0-33.9, adult: Secondary | ICD-10-CM

## 2018-05-28 DIAGNOSIS — Z8249 Family history of ischemic heart disease and other diseases of the circulatory system: Secondary | ICD-10-CM

## 2018-05-28 DIAGNOSIS — M6281 Muscle weakness (generalized): Secondary | ICD-10-CM | POA: Diagnosis not present

## 2018-05-28 DIAGNOSIS — M1990 Unspecified osteoarthritis, unspecified site: Secondary | ICD-10-CM | POA: Diagnosis present

## 2018-05-28 DIAGNOSIS — R000 Tachycardia, unspecified: Secondary | ICD-10-CM | POA: Diagnosis not present

## 2018-05-28 DIAGNOSIS — G9341 Metabolic encephalopathy: Secondary | ICD-10-CM | POA: Diagnosis present

## 2018-05-28 DIAGNOSIS — I509 Heart failure, unspecified: Secondary | ICD-10-CM | POA: Diagnosis not present

## 2018-05-28 DIAGNOSIS — I5043 Acute on chronic combined systolic (congestive) and diastolic (congestive) heart failure: Secondary | ICD-10-CM | POA: Diagnosis not present

## 2018-05-28 DIAGNOSIS — M797 Fibromyalgia: Secondary | ICD-10-CM | POA: Diagnosis present

## 2018-05-28 DIAGNOSIS — Z515 Encounter for palliative care: Secondary | ICD-10-CM | POA: Diagnosis not present

## 2018-05-28 DIAGNOSIS — Z7952 Long term (current) use of systemic steroids: Secondary | ICD-10-CM

## 2018-05-28 DIAGNOSIS — Z79891 Long term (current) use of opiate analgesic: Secondary | ICD-10-CM

## 2018-05-28 DIAGNOSIS — R29810 Facial weakness: Secondary | ICD-10-CM | POA: Diagnosis not present

## 2018-05-28 DIAGNOSIS — I272 Pulmonary hypertension, unspecified: Secondary | ICD-10-CM | POA: Diagnosis present

## 2018-05-28 DIAGNOSIS — Z9841 Cataract extraction status, right eye: Secondary | ICD-10-CM

## 2018-05-28 DIAGNOSIS — Z9842 Cataract extraction status, left eye: Secondary | ICD-10-CM

## 2018-05-28 DIAGNOSIS — M109 Gout, unspecified: Secondary | ICD-10-CM | POA: Diagnosis not present

## 2018-05-28 DIAGNOSIS — E876 Hypokalemia: Secondary | ICD-10-CM | POA: Diagnosis present

## 2018-05-28 DIAGNOSIS — R778 Other specified abnormalities of plasma proteins: Secondary | ICD-10-CM

## 2018-05-28 DIAGNOSIS — Z91018 Allergy to other foods: Secondary | ICD-10-CM | POA: Diagnosis not present

## 2018-05-28 DIAGNOSIS — Z90710 Acquired absence of both cervix and uterus: Secondary | ICD-10-CM

## 2018-05-28 DIAGNOSIS — R918 Other nonspecific abnormal finding of lung field: Secondary | ICD-10-CM | POA: Diagnosis not present

## 2018-05-28 DIAGNOSIS — Z79899 Other long term (current) drug therapy: Secondary | ICD-10-CM

## 2018-05-28 DIAGNOSIS — Z961 Presence of intraocular lens: Secondary | ICD-10-CM | POA: Diagnosis present

## 2018-05-28 DIAGNOSIS — M2550 Pain in unspecified joint: Secondary | ICD-10-CM | POA: Diagnosis not present

## 2018-05-28 DIAGNOSIS — I739 Peripheral vascular disease, unspecified: Secondary | ICD-10-CM | POA: Diagnosis present

## 2018-05-28 DIAGNOSIS — I252 Old myocardial infarction: Secondary | ICD-10-CM

## 2018-05-28 DIAGNOSIS — Z7982 Long term (current) use of aspirin: Secondary | ICD-10-CM

## 2018-05-28 DIAGNOSIS — Z7401 Bed confinement status: Secondary | ICD-10-CM | POA: Diagnosis not present

## 2018-05-28 HISTORY — DX: Heart failure, unspecified: I50.9

## 2018-05-28 HISTORY — DX: Pneumonia, unspecified organism: J18.9

## 2018-05-28 LAB — CBC WITH DIFFERENTIAL/PLATELET
Basophils Absolute: 0 10*3/uL (ref 0.0–0.1)
Basophils Relative: 0 %
Eosinophils Absolute: 0.1 10*3/uL (ref 0.0–0.7)
Eosinophils Relative: 1 %
HCT: 30.1 % — ABNORMAL LOW (ref 36.0–46.0)
Hemoglobin: 9.8 g/dL — ABNORMAL LOW (ref 12.0–15.0)
Lymphocytes Relative: 16 %
Lymphs Abs: 1.6 10*3/uL (ref 0.7–4.0)
MCH: 30.2 pg (ref 26.0–34.0)
MCHC: 32.6 g/dL (ref 30.0–36.0)
MCV: 92.6 fL (ref 78.0–100.0)
Monocytes Absolute: 0.5 10*3/uL (ref 0.1–1.0)
Monocytes Relative: 5 %
Neutro Abs: 7.5 10*3/uL (ref 1.7–7.7)
Neutrophils Relative %: 78 %
Platelets: 368 10*3/uL (ref 150–400)
RBC: 3.25 MIL/uL — ABNORMAL LOW (ref 3.87–5.11)
RDW: 17.4 % — ABNORMAL HIGH (ref 11.5–15.5)
WBC: 9.7 10*3/uL (ref 4.0–10.5)

## 2018-05-28 LAB — I-STAT TROPONIN, ED: Troponin i, poc: 0.04 ng/mL (ref 0.00–0.08)

## 2018-05-28 LAB — URINALYSIS, ROUTINE W REFLEX MICROSCOPIC
Bilirubin Urine: NEGATIVE
Glucose, UA: NEGATIVE mg/dL
Hgb urine dipstick: NEGATIVE
Ketones, ur: NEGATIVE mg/dL
Leukocytes, UA: NEGATIVE
Nitrite: NEGATIVE
Protein, ur: NEGATIVE mg/dL
Specific Gravity, Urine: 1.004 — ABNORMAL LOW (ref 1.005–1.030)
pH: 6 (ref 5.0–8.0)

## 2018-05-28 LAB — COMPREHENSIVE METABOLIC PANEL
ALT: 20 U/L (ref 0–44)
AST: 23 U/L (ref 15–41)
Albumin: 3.3 g/dL — ABNORMAL LOW (ref 3.5–5.0)
Alkaline Phosphatase: 81 U/L (ref 38–126)
Anion gap: 9 (ref 5–15)
BUN: 17 mg/dL (ref 8–23)
CO2: 24 mmol/L (ref 22–32)
Calcium: 9.1 mg/dL (ref 8.9–10.3)
Chloride: 108 mmol/L (ref 98–111)
Creatinine, Ser: 0.83 mg/dL (ref 0.44–1.00)
GFR calc Af Amer: >60 mL/min (ref >60)
GFR calc non Af Amer: >60 mL/min (ref >60)
Glucose, Bld: 154 mg/dL — ABNORMAL HIGH (ref 70–99)
Potassium: 4.1 mmol/L (ref 3.5–5.1)
Sodium: 141 mmol/L (ref 135–145)
Total Bilirubin: 0.6 mg/dL (ref 0.3–1.2)
Total Protein: 6.8 g/dL (ref 6.5–8.1)

## 2018-05-28 LAB — I-STAT CG4 LACTIC ACID, ED: Lactic Acid, Venous: 0.84 mmol/L (ref 0.5–1.9)

## 2018-05-28 LAB — BRAIN NATRIURETIC PEPTIDE: B Natriuretic Peptide: 2077.6 pg/mL — ABNORMAL HIGH (ref 0.0–100.0)

## 2018-05-28 MED ORDER — LABETALOL HCL 100 MG PO TABS
100.0000 mg | ORAL_TABLET | Freq: Once | ORAL | Status: AC
  Administered 2018-05-28: 100 mg via ORAL
  Filled 2018-05-28: qty 1

## 2018-05-28 MED ORDER — ASPIRIN EC 81 MG PO TBEC
81.0000 mg | DELAYED_RELEASE_TABLET | Freq: Every day | ORAL | Status: DC
  Administered 2018-05-29 – 2018-06-04 (×7): 81 mg via ORAL
  Filled 2018-05-28 (×7): qty 1

## 2018-05-28 MED ORDER — VITAMIN D3 25 MCG (1000 UNIT) PO TABS
1000.0000 [IU] | ORAL_TABLET | Freq: Every day | ORAL | Status: DC
  Administered 2018-05-29: 1000 [IU] via ORAL
  Filled 2018-05-28 (×2): qty 1

## 2018-05-28 MED ORDER — ENOXAPARIN SODIUM 40 MG/0.4ML ~~LOC~~ SOLN
40.0000 mg | SUBCUTANEOUS | Status: DC
  Administered 2018-05-29 – 2018-05-31 (×3): 40 mg via SUBCUTANEOUS
  Filled 2018-05-28 (×3): qty 0.4

## 2018-05-28 MED ORDER — DICLOFENAC SODIUM 1 % TD GEL
1.0000 "application " | Freq: Every day | TRANSDERMAL | Status: DC
  Administered 2018-05-29 – 2018-06-04 (×5): 1 via TOPICAL
  Filled 2018-05-28 (×2): qty 100

## 2018-05-28 MED ORDER — INSULIN ASPART 100 UNIT/ML ~~LOC~~ SOLN: 0.0000 [IU] | Freq: Every day | SUBCUTANEOUS | Status: DC

## 2018-05-28 MED ORDER — LISINOPRIL 20 MG PO TABS
30.0000 mg | ORAL_TABLET | Freq: Every day | ORAL | Status: DC
  Administered 2018-05-29: 30 mg via ORAL
  Filled 2018-05-28: qty 1

## 2018-05-28 MED ORDER — INSULIN ASPART 100 UNIT/ML ~~LOC~~ SOLN
0.0000 [IU] | Freq: Three times a day (TID) | SUBCUTANEOUS | Status: DC
  Administered 2018-05-29: 2 [IU] via SUBCUTANEOUS

## 2018-05-28 MED ORDER — SODIUM CHLORIDE 0.9 % IV SOLN
500.0000 mg | INTRAVENOUS | Status: DC
  Administered 2018-05-29 – 2018-06-01 (×4): 500 mg via INTRAVENOUS
  Filled 2018-05-28 (×4): qty 500

## 2018-05-28 MED ORDER — GABAPENTIN 100 MG PO CAPS
100.0000 mg | ORAL_CAPSULE | Freq: Every day | ORAL | Status: DC
  Administered 2018-05-29 – 2018-06-03 (×7): 100 mg via ORAL
  Filled 2018-05-28 (×7): qty 1

## 2018-05-28 MED ORDER — SODIUM CHLORIDE 0.9% FLUSH: 3.0000 mL | INTRAVENOUS | Status: DC | PRN

## 2018-05-28 MED ORDER — ACETAMINOPHEN 325 MG PO TABS: 650.0000 mg | ORAL_TABLET | ORAL | Status: DC | PRN

## 2018-05-28 MED ORDER — SODIUM CHLORIDE 0.9 % IV SOLN
2.0000 g | INTRAVENOUS | Status: DC
  Administered 2018-05-29 – 2018-06-03 (×6): 2 g via INTRAVENOUS
  Filled 2018-05-28: qty 20
  Filled 2018-05-28: qty 2
  Filled 2018-05-28: qty 20
  Filled 2018-05-28: qty 2
  Filled 2018-05-28 (×2): qty 20
  Filled 2018-05-28: qty 2

## 2018-05-28 MED ORDER — PREDNISONE 10 MG PO TABS
10.0000 mg | ORAL_TABLET | Freq: Every day | ORAL | Status: DC
  Administered 2018-05-29 – 2018-06-04 (×6): 10 mg via ORAL
  Filled 2018-05-28 (×7): qty 1

## 2018-05-28 MED ORDER — FERROUS SULFATE 325 (65 FE) MG PO TABS
325.0000 mg | ORAL_TABLET | Freq: Every day | ORAL | Status: DC
  Administered 2018-05-29: 325 mg via ORAL
  Filled 2018-05-28: qty 1

## 2018-05-28 MED ORDER — SODIUM CHLORIDE 0.9% FLUSH
3.0000 mL | Freq: Two times a day (BID) | INTRAVENOUS | Status: DC
  Administered 2018-05-29 – 2018-06-04 (×12): 3 mL via INTRAVENOUS

## 2018-05-28 MED ORDER — LABETALOL HCL 200 MG PO TABS
200.0000 mg | ORAL_TABLET | Freq: Two times a day (BID) | ORAL | Status: DC
  Administered 2018-05-29 – 2018-05-30 (×3): 200 mg via ORAL
  Filled 2018-05-28 (×3): qty 1

## 2018-05-28 MED ORDER — CALCIUM CARBONATE-VITAMIN D 500-200 MG-UNIT PO TABS
1.0000 | ORAL_TABLET | Freq: Every day | ORAL | Status: DC
  Administered 2018-05-29 – 2018-06-04 (×6): 1 via ORAL
  Filled 2018-05-28 (×7): qty 1

## 2018-05-28 MED ORDER — VITAMIN C 500 MG PO TABS
500.0000 mg | ORAL_TABLET | ORAL | Status: DC
  Administered 2018-05-29 – 2018-06-04 (×6): 500 mg via ORAL
  Filled 2018-05-28 (×6): qty 1

## 2018-05-28 MED ORDER — POTASSIUM CHLORIDE CRYS ER 20 MEQ PO TBCR
20.0000 meq | EXTENDED_RELEASE_TABLET | Freq: Every day | ORAL | Status: DC
  Administered 2018-05-29 – 2018-06-04 (×7): 20 meq via ORAL
  Filled 2018-05-28 (×7): qty 1

## 2018-05-28 MED ORDER — IPRATROPIUM-ALBUTEROL 0.5-2.5 (3) MG/3ML IN SOLN: 3.0000 mL | Freq: Four times a day (QID) | RESPIRATORY_TRACT | Status: DC

## 2018-05-28 MED ORDER — HYDRALAZINE HCL 50 MG PO TABS
100.0000 mg | ORAL_TABLET | Freq: Once | ORAL | Status: AC
  Administered 2018-05-28: 100 mg via ORAL
  Filled 2018-05-28: qty 2

## 2018-05-28 MED ORDER — SODIUM CHLORIDE 0.9 % IV SOLN: 1.0000 g | Freq: Once | INTRAVENOUS | Status: DC

## 2018-05-28 MED ORDER — HYDRALAZINE HCL 50 MG PO TABS
100.0000 mg | ORAL_TABLET | Freq: Three times a day (TID) | ORAL | Status: DC
  Administered 2018-05-29 – 2018-06-04 (×20): 100 mg via ORAL
  Filled 2018-05-28 (×21): qty 2

## 2018-05-28 MED ORDER — ONDANSETRON HCL 4 MG/2ML IJ SOLN: 4.0000 mg | Freq: Four times a day (QID) | INTRAMUSCULAR | Status: DC | PRN

## 2018-05-28 MED ORDER — IOPAMIDOL (ISOVUE-370) INJECTION 76%
100.0000 mL | Freq: Once | INTRAVENOUS | Status: AC | PRN
  Administered 2018-05-28: 100 mL via INTRAVENOUS

## 2018-05-28 MED ORDER — AZITHROMYCIN 500 MG IV SOLR: 500.0000 mg | Freq: Once | INTRAVENOUS | Status: DC

## 2018-05-28 MED ORDER — IOPAMIDOL (ISOVUE-370) INJECTION 76%
INTRAVENOUS | Status: AC
  Filled 2018-05-28: qty 100

## 2018-05-28 MED ORDER — POLYVINYL ALCOHOL 1.4 % OP SOLN
1.0000 [drp] | OPHTHALMIC | Status: DC | PRN
  Filled 2018-05-28: qty 15

## 2018-05-28 MED ORDER — ALLOPURINOL 100 MG PO TABS
200.0000 mg | ORAL_TABLET | Freq: Every day | ORAL | Status: DC
  Administered 2018-05-29 – 2018-06-03 (×6): 200 mg via ORAL
  Filled 2018-05-28 (×7): qty 2

## 2018-05-28 MED ORDER — HYDROXYCHLOROQUINE SULFATE 200 MG PO TABS
200.0000 mg | ORAL_TABLET | Freq: Two times a day (BID) | ORAL | Status: DC
  Administered 2018-05-29 – 2018-06-04 (×12): 200 mg via ORAL
  Filled 2018-05-28 (×14): qty 1

## 2018-05-28 MED ORDER — SODIUM CHLORIDE 0.9 % IV SOLN: 250.0000 mL | INTRAVENOUS | Status: DC | PRN

## 2018-05-28 MED ORDER — SODIUM CHLORIDE 0.9 % IV BOLUS
1000.0000 mL | Freq: Once | INTRAVENOUS | Status: AC
  Administered 2018-05-28: 1000 mL via INTRAVENOUS

## 2018-05-28 MED ORDER — TRIAMCINOLONE ACETONIDE 0.1 % EX CREA
1.0000 "application " | TOPICAL_CREAM | Freq: Two times a day (BID) | CUTANEOUS | Status: DC
  Administered 2018-05-29 – 2018-06-04 (×12): 1 via TOPICAL
  Filled 2018-05-28 (×2): qty 15

## 2018-05-28 MED ORDER — LEFLUNOMIDE 20 MG PO TABS
20.0000 mg | ORAL_TABLET | Freq: Every day | ORAL | Status: DC
  Filled 2018-05-28: qty 1

## 2018-05-28 MED ORDER — HYPROMELLOSE (GONIOSCOPIC) 2.5 % OP SOLN: 1.0000 [drp] | Freq: Four times a day (QID) | OPHTHALMIC | Status: DC | PRN

## 2018-05-28 MED ORDER — AZITHROMYCIN 500 MG IV SOLR
500.0000 mg | Freq: Once | INTRAVENOUS | Status: AC
  Administered 2018-05-28: 500 mg via INTRAVENOUS
  Filled 2018-05-28: qty 500

## 2018-05-28 MED ORDER — FUROSEMIDE 10 MG/ML IJ SOLN
40.0000 mg | Freq: Once | INTRAMUSCULAR | Status: AC
  Administered 2018-05-28: 40 mg via INTRAVENOUS
  Filled 2018-05-28: qty 4

## 2018-05-28 MED ORDER — SODIUM CHLORIDE 0.9 % IV SOLN
1.0000 g | Freq: Once | INTRAVENOUS | Status: AC
  Administered 2018-05-28: 1 g via INTRAVENOUS
  Filled 2018-05-28: qty 10

## 2018-05-28 MED ORDER — FUROSEMIDE 10 MG/ML IJ SOLN
20.0000 mg | Freq: Every day | INTRAMUSCULAR | Status: DC
  Administered 2018-05-29 – 2018-05-30 (×2): 20 mg via INTRAVENOUS
  Filled 2018-05-28 (×2): qty 2

## 2018-05-28 MED ORDER — TRAMADOL HCL 50 MG PO TABS: 50.0000 mg | ORAL_TABLET | Freq: Four times a day (QID) | ORAL | Status: DC | PRN

## 2018-05-28 NOTE — ED Triage Notes (Signed)
PT c/o general weakness starting 05/22/18, intermittent AMS , HTN

## 2018-05-28 NOTE — ED Provider Notes (Signed)
[facility] DEPT Provider Note   CSN: [id number] Arrival date & time: 05/28/18  1336     History   Chief Complaint Chief Complaint  Patient presents with  . Weakness    HPI [name] [name] is a 84 y.o. female presenting for evaluation of weakness, confusion, chest pain, and hypertension.  Patient states over the past week, she has been getting generally weaker.  She was evaluated in the ER 4 days ago, had a normal work-up and was discharged after being observed overnight.  Patient states that this morning, her symptoms worsened.  She is having worsening weakness.  She has had intermittent chills today.  She has noticed that her blood pressure has been higher, despite taking her normal medications.  Family states patient has been more confused over the past several days.  Patient states she had one episode of chest pain earlier this morning with associated shortness of breath.  This lasted for about 2 minutes.  Pain was of her central chest, did not radiate.  She denies associated nausea, vomiting, or diaphoresis.  She was at rest with this pain started.  She has a history of several heart attacks and "mini strokes" approximately 15 years ago.  She states she is not on blood thinners.  Patient states last week she was taken off her methotrexate and another RA medication and started on a new one.  When she started getting weak, her rheumatologist took her off the new medication.  She is currently only on prednisone.  She is not sure why medications were switched.  She denies headache, vision changes, cough, abdominal pain, urinary symptoms, abnormal bowel movements.  She reports leg swelling at baseline, no changes.  She did not take her Lasix today, normally takes them every other day, last took it yesterday.  Medical history significant for hypertension, CKD, diabetes, CHF, hyperlipidemia, fibromyalgia, CAD, RA, history of CVA, anemia.  Patient denies fall, trauma,  or injury.  HPI  Past Medical History:  Diagnosis Date  . Allergic rhinitis due to pollen 04/27/2007  . Arthritis    "in q joint" (08/07/2013)  . Coronary atherosclerosis of native coronary artery   . Cough   . Disorder of bone and cartilage, unspecified   . Edema 05/03/2013  . Exertional shortness of breath    "sometimes" (08/07/2013)  . First degree atrioventricular block   . Gout, unspecified 04/19/2013  . Hyperlipidemia 04/27/2007  . Hypertension   . Muscle spasm   . Muscle weakness (generalized)   . Myocardial infarction ([place]) ~ 1970  . Onychia and paronychia of toe   . Osteoarthrosis, unspecified whether generalized or localized, unspecified site 04/27/2007  . Other fall   . Other malaise and fatigue   . Prepatellar bursitis   . Seizures ([place])   . Stroke ([place]) 01/17/2014  . TIA (transient ischemic attack)    "a few one summer" (08/07/2013)  . Type II diabetes mellitus ([place])    "fasting 90-110s" (08/07/2013)  . Unspecified essential hypertension 08/08/2013  . Unspecified vitamin D deficiency     Patient Active Problem List   Diagnosis Date Noted  . Diarrhea 02/02/2018  . Hypokalemia 02/02/2018  . SIRS (systemic inflammatory response syndrome) ([place]) 11/26/2017  . Acute pain of right shoulder 10/12/2017  . Adhesive capsulitis of right shoulder 10/12/2017  . Idiopathic chronic gout of multiple sites with tophus 08/25/2017  . High risk medication use 08/25/2017  . Rheumatic nodule 06/28/2017  . History of CHF (congestive heart  failure) 02/18/2017  . History of total knee arthroplasty, bilateral 02/18/2017  . History of rotator cuff surgery 02/18/2017  . Obesity (BMI 30.0-34.9) 12/08/2016  . High risk medications (not anticoagulants) long-term use 09/21/2016  . Hammer toe 10/31/2015  . Acute on chronic diastolic heart failure ([place]) 09/12/2015  . Onychomycosis 04/24/2015  . Midline low back pain without sciatica 06/27/2014  . Bilateral edema of lower extremity  06/05/2014  . CKD (chronic kidney disease) stage 3, GFR 30-59 ml/min (HCC) 06/05/2014  . DM (diabetes mellitus), type 2 with renal complications ([place]) 29/52/8413    Class: Chronic  . Anemia of chronic disease 05/01/2014  . Fluctuating blood pressure 02/06/2014  . Stroke ([place]) 01/17/2014  . Rheumatoid arthritis involving multiple sites ([place]) 12/19/2013    Class: Chronic  . CAD (coronary artery disease) 10/18/2013  . HTN (hypertension) 08/08/2013    Class: Chronic  . Fibromyalgia 05/10/2013  . Reactive airway disease 04/19/2013  . Gout 04/19/2013  . Hyperlipidemia 04/27/2007  . Allergic rhinitis 04/27/2007  . Diverticulosis 04/27/2007    Past Surgical History:  Procedure Laterality Date  . ABDOMINAL HYSTERECTOMY  04/1980  . APPENDECTOMY  1960  . CARDIAC CATHETERIZATION  2003  . CATARACT EXTRACTION W/ INTRAOCULAR LENS  IMPLANT, BILATERAL Bilateral ~ 2012  . JOINT REPLACEMENT    . REPLACEMENT TOTAL KNEE Right 09/2005  . SHOULDER OPEN ROTATOR CUFF REPAIR Right 07/1999  . TONSILLECTOMY  08/1974  . TOTAL KNEE ARTHROPLASTY Left 08/07/2013   Procedure: TOTAL KNEE ARTHROPLASTY- LEFT;  Surgeon: Vickey Huger, MD;  Location: [facility];  Service: Orthopedics;  Laterality: Left;  . TRANSTHORACIC ECHOCARDIOGRAM  2003   EF 55-65%; mild concentric LVH     OB History   None      Home Medications    Prior to Admission medications   Medication Sig Start Date End Date Taking? Authorizing Provider  albuterol (PROVENTIL HFA;VENTOLIN HFA) 108 (90 Base) MCG/ACT inhaler Inhale 1 puff into the lungs every 6 (six) hours as needed for wheezing or shortness of breath.    [provider]  allopurinol (ZYLOPRIM) 100 MG tablet Take 200 mg by mouth daily.    [provider]  allopurinol (ZYLOPRIM) 300 MG tablet Take 0.5 tablets (150 mg total) by mouth every evening. 11/29/17 02/02/18  Elodia Florence., MD  aspirin EC 81 MG tablet Take 81 mg by mouth daily.    [provider]    blood glucose meter kit and supplies KIT Dispense based on patient and insurance preference. Use up to four times daily as directed. (FOR ICD-9 250.00, 250.01). 07/30/17   Briscoe Deutscher, DO  calcium-vitamin D (OSCAL WITH D) 500-200 MG-UNIT per tablet Take 1 tablet by mouth daily.     [provider]  CHLOROPHYLL PO Take 5 drops by mouth See admin instructions. MIX INTO 6 OUNCES OF JUICE AND DRINK ONCE A DAY    [provider]  cholecalciferol (VITAMIN D) 1000 units tablet Take 1,000 Units by mouth daily.    [provider]  diclofenac sodium (VOLTAREN) 1 % GEL Apply 1 application topically daily. 02/22/18   Briscoe Deutscher, DO  ferrous sulfate 325 (65 FE) MG tablet Take 325 mg by mouth daily.     [provider]  folic acid (FOLVITE) 1 MG tablet Take 1 mg by mouth daily.    [provider]  furosemide (LASIX) 20 MG tablet Take 1 tablet (20 mg total) by mouth every other day. 05/17/18   Briscoe Deutscher,  DO  gabapentin (NEURONTIN) 100 MG capsule Take 1 capsule (100 mg total) by mouth at bedtime. 05/19/18   Briscoe Deutscher, DO  glucose blood test strip Test QID dx code E11.9 12/24/17   Briscoe Deutscher, DO  hydrALAZINE (APRESOLINE) 100 MG tablet TAKE 1 TABLET BY MOUTH THREE TIMES DAILY 04/05/18   Briscoe Deutscher, DO  hydroxychloroquine (PLAQUENIL) 200 MG tablet Take 200 mg by mouth 2 (two) times daily.    [provider]  hydroxypropyl methylcellulose / hypromellose (ISOPTO TEARS / GONIOVISC) 2.5 % ophthalmic solution Place 1 drop into both eyes 4 (four) times daily as needed for dry eyes. 07/07/17   Briscoe Deutscher, DO  labetalol (NORMODYNE) 100 MG tablet TAKE 2 TABLETS BY MOUTH TWICE A DAY 04/06/18   Briscoe Deutscher, DO  leflunomide (ARAVA) 20 MG tablet Take 20 mg by mouth daily.  05/20/18   [provider]  lisinopril (PRINIVIL,ZESTRIL) 2.5 MG tablet Take 1 tablet (2.5 mg total) by mouth daily. Patient not taking: Reported on 05/24/2018 02/04/18   Donne Yareli, MD  lisinopril (PRINIVIL,ZESTRIL) 30 MG tablet Take 30 mg by mouth daily.    [provider]  metFORMIN (GLUCOPHAGE) 500 MG tablet Take 1 tablet (500 mg total) by mouth daily with breakfast. 01/28/18   Briscoe Deutscher, DO  methotrexate (RHEUMATREX) 2.5 MG tablet TAKE 4 TABLETS(10 MG) BY MOUTH 1 TIME A WEEK Patient not taking: Reported on 05/24/2018 03/21/18   Briscoe Deutscher, DO  Multiple Vitamins-Minerals (ALIVE ONCE DAILY WOMENS PO) Take 15 mLs by mouth every other day.     [provider]  Olopatadine HCl 0.2 % SOLN Apply 1 drop to eye daily. Patient not taking: Reported on 05/24/2018 02/22/18   Briscoe Deutscher, DO  oxycodone (OXY-IR) 5 MG capsule Take 1 capsule (5 mg total) by mouth every 4 (four) hours as needed for pain. Patient not taking: Reported on 05/24/2018 12/03/17   Briscoe Deutscher, DO  oxyCODONE-acetaminophen (PERCOCET/ROXICET) 5-325 MG tablet Take 1 tablet by mouth every 4 (four) hours as needed for severe pain. Patient not taking: Reported on 05/24/2018 03/14/18   Briscoe Deutscher, DO  potassium chloride 20 MEQ TBCR Take 20 mEq by mouth daily. 05/17/18   Briscoe Deutscher, DO  predniSONE (DELTASONE) 10 MG tablet Take 1 tablet (10 mg total) by mouth daily with breakfast. Patient not taking: Reported on 05/24/2018 02/22/18   Briscoe Deutscher, DO  predniSONE (DELTASONE) 10 MG tablet Take 1 tablet (10 mg total) by mouth daily with breakfast. 03/25/18   Briscoe Deutscher, DO  ranitidine (ZANTAC) 150 MG tablet TAKE 1 TABLET(150 MG) BY MOUTH AT BEDTIME 04/11/18   Briscoe Deutscher, DO  traMADol (ULTRAM) 50 MG tablet TAKE 1 TO 2 TABLETS BY MOUTH EVERY 6 HOURS AS NEEDED FOR PAIN Patient taking differently: Take 50-100 mg by mouth every 6 (six) hours as needed for moderate pain.  05/18/18   Briscoe Deutscher, DO  triamcinolone cream (KENALOG) 0.1 % Apply 1 application topically 2 (two) times daily.    [provider]  vitamin C (ASCORBIC ACID) 500 MG tablet Take 500 mg by mouth every other  day.     [provider]    Family History Family History  Problem Relation Age of Onset  . Heart attack Father     Social History Social History   Tobacco Use  . Smoking status: Never Smoker  . Smokeless tobacco: Never Used  Substance Use Topics  . Alcohol use: No    Alcohol/week: 0.0 oz  .  Drug use: No     Allergies   Clonidine derivatives; Fish allergy; Shellfish allergy; Doxycycline; Indomethacin; Lyrica [pregabalin]; Methyldopa; Orange fruit [citrus]; Cetirizine hcl; Codeine; Levaquin [levofloxacin in d5w]; and Tomato   Review of Systems Review of Systems  Constitutional: Positive for chills.  Respiratory: Positive for shortness of breath (resolved).   Cardiovascular: Positive for chest pain (resolved).  Allergic/Immunologic: Positive for immunocompromised state (on chronic steroids).  Neurological: Positive for weakness.       Family states more confused  All other systems reviewed and are negative.    Physical Exam Updated Vital Signs BP (!) 196/107 (BP Location: Left Arm)   Pulse (!) 109   Temp 99.6 F (37.6 C) (Oral)   SpO2 99%   Physical Exam  Constitutional: She is oriented to person, place, and time. She appears well-developed and well-nourished. No distress.  Appears in no distress. Feels warm to the touch  HENT:  Head: Normocephalic and atraumatic.  Eyes: Pupils are equal, round, and reactive to light. Conjunctivae and EOM are normal.  Neck: Normal range of motion. Neck supple.  Cardiovascular: Normal rate, regular rhythm and intact distal pulses.  Pulmonary/Chest: Effort normal and breath sounds normal. No respiratory distress. She has no wheezes.  speaking in full sentences. Clear lung sounds in all fields.   Abdominal: Soft. She exhibits no distension and no mass. There is no tenderness. There is no guarding.  Musculoskeletal: Normal range of motion. She exhibits edema. She exhibits no tenderness.  bilateral 2+ pitting edema. Pedal  pulses intct bilaterally. No erythema or tenderness of legs.   Neurological: She is alert and oriented to person, place, and time. No sensory deficit.  A&O x4. No obvious neurologic deficits. Occasional difficulty finding words.  Skin: Skin is warm and dry. Capillary refill takes less than 2 seconds.  Psychiatric: She has a normal mood and affect.  Nursing note and vitals reviewed.    ED Treatments / Results  Labs (all labs ordered are listed, but only abnormal results are displayed) Labs Reviewed  URINE CULTURE  COMPREHENSIVE METABOLIC PANEL  CBC WITH DIFFERENTIAL/PLATELET  URINALYSIS, ROUTINE W REFLEX MICROSCOPIC  I-STAT TROPONIN, ED    EKG None  Radiology No results found.  Procedures Procedures (including critical care time)  Medications Ordered in ED Medications - No data to display   Initial Impression / Assessment and Plan / ED Course  I have reviewed the triage vital signs and the nursing notes.  Pertinent labs & imaging results that were available during my care of the patient were reviewed by me and considered in my medical decision making (see chart for details).     Patient presenting for evaluation of generalized weakness, altered mental status, hypertension, and chest pain.  Physical exam shows elderly female in no apparent distress.  Patient feels warm to the touch, tachycardic on arrival.  Concern for possible infection, will obtain lactic, chest x-ray, and urine.  Labs obtained.  Additionally, patient is hypertensive, will give dose of home medications and reassess.  Will obtain EKG and troponin due to patient's chest pain and shortness of breath.  BNP due to patient's bilateral leg swelling and shortness of breath.   BNP elevated at 2000, lasix given.  Chest x-ray viewed interpreted by me, shows cardiomegaly with a possible right upper lobe pneumonia.  Without cough or high fevers, ?pneumonia versus x-ray changes due to PE, will obtain CTA.  Antibiotic  started for potential pneumonia.  On reassessment of patient's blood pressure, improved with  her medications.  No hypertensive emergency at this time.  CBC pending.  Lactic negative.  Troponin negative.    Called lab multiple times regarding patient's CBC.  Was told that results would be back in 10 to 15 minutes.  Called 30 minutes later, told that results are still running.  Got a phone call 20 minutes later stating that the tube was labeled incorrectly and the lab needs to be redrawn.  CTA consistent with pneumonia without PE.  Once again shows cardiomegaly.  CBC shows mild anemia, only mildly down from previous.  I do not believe she needs blood at this time.  Due to patient's elevated BNP and pneumonia, will call for admission.  Discussed with Dr. Ara Kussmaul from [facility] hospitalist service, patient to be admitted.   Final Clinical Impressions(s) / ED Diagnoses   Final diagnoses:  Pneumonia of left upper lobe due to infectious organism [facility])  Acute on chronic congestive heart failure, unspecified heart failure type ([place])  Hypertension, unspecified type    ED Discharge Orders    None       Franchot Heidelberg, PA-C 05/28/18 2207    Drenda Freeze, MD 05/28/18 [phone]

## 2018-05-28 NOTE — ED Notes (Signed)
1907 -- sent another cbc to lab, notified provider.

## 2018-05-28 NOTE — H&P (Signed)
History and Physical   [facility] [place] [facility] [place] Admission History and Physical [employer], D.O.    Patient Name: [name] [name] MR#: [id number] Date of Birth: 11-20-1929 Date of Admission: 05/28/2018  Referring MD/NP/PA: PA Sophia Primary Care Physician: Briscoe Deutscher, DO  Chief Complaint:  Chief Complaint  Patient presents with  . Weakness    HPI: [name] [name] is a 84 y.o. female with a known history of CVA, HTN, DM, HLD, CHF, CKD, RA, anemia, seizure presents to the emergency department for evaluation of weakness.  Patient was in a usual state of health until one week ago when she reports the onset of weakness.  She was seen in the ER four days ago and discharged home after observation.  She reports localizedchest pain associated with SOB, high blood pressure, and confusion.  Only change is that her RA medication was switched recently.  She is normally functionally independent however has required assistance with ADLs recently.  Daughter says she has been declining recently.   Patient denies fevers/chills, N/V/C/D, abdominal pain, dysuria/frequency, changes in mental status.    Otherwise there has been no change in status. Patient has been taking medication as prescribed and there has been no recent change in medication or diet.  No recent antibiotics.  There has been no recent illness, hospitalizations, travel or sick contacts.    EMS/ED Course: Patient received Rocephin, azithro, Lasix, hydralazine, labetalol, NS. Medical admission has been requested for further management of CHF exacerbation, CAP, uncontrolled HTN, chest pain.  Review of Systems:  CONSTITUTIONAL: Positive chills, fatigue, weakness, negative weight gain/loss, headache. EYES: No blurry or double vision. ENT: No tinnitus, postnasal drip, redness or soreness of the oropharynx. RESPIRATORY: Positive SOB. No cough, wheeze.  No hemoptysis.  CARDIOVASCULAR: Positive chest pain, negative  palpitations, syncope, orthopnea. No lower extremity edema.  GASTROINTESTINAL: No nausea, vomiting, abdominal pain, diarrhea, constipation.  No hematemesis, melena or hematochezia. GENITOURINARY: No dysuria, frequency, hematuria. ENDOCRINE: No polyuria or nocturia. No heat or cold intolerance. HEMATOLOGY: No anemia, bruising, bleeding. INTEGUMENTARY: No rashes, ulcers, lesions. MUSCULOSKELETAL: No arthritis, gout. NEUROLOGIC: No numbness, tingling, ataxia, seizure-type activity, weakness. PSYCHIATRIC: No anxiety, depression, insomnia.   Past Medical History:  Diagnosis Date  . Allergic rhinitis due to pollen 04/27/2007  . Arthritis    "in q joint" (08/07/2013)  . Coronary atherosclerosis of native coronary artery   . Cough   . Disorder of bone and cartilage, unspecified   . Edema 05/03/2013  . Exertional shortness of breath    "sometimes" (08/07/2013)  . First degree atrioventricular block   . Gout, unspecified 04/19/2013  . Hyperlipidemia 04/27/2007  . Hypertension   . Muscle spasm   . Muscle weakness (generalized)   . Myocardial infarction ([place]) ~ 1970  . Onychia and paronychia of toe   . Osteoarthrosis, unspecified whether generalized or localized, unspecified site 04/27/2007  . Other fall   . Other malaise and fatigue   . Prepatellar bursitis   . Seizures ([place])   . Stroke ([place]) 01/17/2014  . TIA (transient ischemic attack)    "a few one summer" (08/07/2013)  . Type II diabetes mellitus ([place])    "fasting 90-110s" (08/07/2013)  . Unspecified essential hypertension 08/08/2013  . Unspecified vitamin D deficiency     Past Surgical History:  Procedure Laterality Date  . ABDOMINAL HYSTERECTOMY  04/1980  . APPENDECTOMY  1960  . CARDIAC CATHETERIZATION  2003  . CATARACT EXTRACTION W/ INTRAOCULAR LENS  IMPLANT, BILATERAL Bilateral ~ 2012  .  JOINT REPLACEMENT    . REPLACEMENT TOTAL KNEE Right 09/2005  . SHOULDER OPEN ROTATOR CUFF REPAIR Right 07/1999  . TONSILLECTOMY  08/1974   . TOTAL KNEE ARTHROPLASTY Left 08/07/2013   Procedure: TOTAL KNEE ARTHROPLASTY- LEFT;  Surgeon: Vickey Huger, MD;  Location: [facility];  Service: Orthopedics;  Laterality: Left;  . TRANSTHORACIC ECHOCARDIOGRAM  2003   EF 55-65%; mild concentric LVH     reports that she has never smoked. She has never used smokeless tobacco. She reports that she does not drink alcohol or use drugs.  Allergies  Allergen Reactions  . Clonidine Derivatives Swelling    [name]  . Fish Allergy Diarrhea, Swelling and Other (See Comments)    Turns skin "black," but can tolerate white fish Salmon- Diarrhea  . Shellfish Allergy Hives  . Doxycycline Rash  . Indomethacin     Reaction not recalled by the patient  . Lyrica [Pregabalin]     Hallucinations   . Methyldopa     Aldomet (for hypertension): Reaction not recalled by the patient  . Orange Fruit [Citrus] Other (See Comments)    Indigestion/heartburn  . Cetirizine Hcl Itching and Rash  . Codeine Itching  . Levaquin [Levofloxacin In D5w] Rash  . Tomato Rash    Family History  Problem Relation Age of Onset  . Heart attack Father     Prior to Admission medications   Medication Sig Start Date End Date Taking? Authorizing Provider  albuterol (PROVENTIL HFA;VENTOLIN HFA) 108 (90 Base) MCG/ACT inhaler Inhale 1 puff into the lungs every 6 (six) hours as needed for wheezing or shortness of breath.   Yes [provider]  allopurinol (ZYLOPRIM) 100 MG tablet Take 200 mg by mouth daily.   Yes [provider]  aspirin EC 81 MG tablet Take 81 mg by mouth daily.   Yes [provider]  calcium-vitamin D (OSCAL WITH D) 500-200 MG-UNIT per tablet Take 1 tablet by mouth daily.    Yes [provider]  CHLOROPHYLL PO Take 5 drops by mouth See admin instructions. MIX INTO 6 OUNCES OF JUICE AND DRINK ONCE A DAY   Yes [provider]  cholecalciferol (VITAMIN  D) 1000 units tablet Take 1,000 Units by mouth daily.   Yes [provider]  diclofenac sodium (VOLTAREN) 1 % GEL Apply 1 application topically daily. 02/22/18  Yes Briscoe Deutscher, DO  ferrous sulfate 325 (65 FE) MG tablet Take 325 mg by mouth daily.    Yes [provider]  furosemide (LASIX) 20 MG tablet Take 1 tablet (20 mg total) by mouth every other day. 05/17/18  Yes Briscoe Deutscher, DO  gabapentin (NEURONTIN) 100 MG capsule Take 1 capsule (100 mg total) by mouth at bedtime. 05/19/18  Yes Briscoe Deutscher, DO  hydrALAZINE (APRESOLINE) 100 MG tablet TAKE 1 TABLET BY MOUTH THREE TIMES DAILY 04/05/18  Yes Briscoe Deutscher, DO  hydroxychloroquine (PLAQUENIL) 200 MG tablet Take 200 mg by mouth 2 (two) times daily.   Yes [provider]  hydroxypropyl methylcellulose / hypromellose (ISOPTO TEARS / GONIOVISC) 2.5 % ophthalmic solution Place 1 drop into both eyes 4 (four) times daily as needed for dry eyes. 07/07/17  Yes Briscoe Deutscher, DO  labetalol (NORMODYNE) 100 MG tablet TAKE 2 TABLETS BY MOUTH TWICE A DAY 04/06/18  Yes Briscoe Deutscher, DO  leflunomide (ARAVA) 20 MG tablet Take 20 mg by mouth daily.  05/20/18  Yes [provider]  lisinopril (PRINIVIL,ZESTRIL)  30 MG tablet Take 30 mg by mouth daily.   Yes [provider]  metFORMIN (GLUCOPHAGE) 500 MG tablet Take 1 tablet (500 mg total) by mouth daily with breakfast. 01/28/18  Yes Briscoe Deutscher, DO  Multiple Vitamins-Minerals (ALIVE ONCE DAILY WOMENS PO) Take 15 mLs by mouth every other day.    Yes [provider]  potassium chloride 20 MEQ TBCR Take 20 mEq by mouth daily. 05/17/18  Yes Briscoe Deutscher, DO  predniSONE (DELTASONE) 10 MG tablet Take 1 tablet (10 mg total) by mouth daily with breakfast. 03/25/18  Yes Briscoe Deutscher, DO  traMADol (ULTRAM) 50 MG tablet TAKE 1 TO 2 TABLETS BY MOUTH EVERY 6 HOURS AS NEEDED FOR PAIN Patient taking differently: Take 50-100 mg by mouth every 6 (six) hours as needed for  moderate pain.  05/18/18  Yes Briscoe Deutscher, DO  triamcinolone cream (KENALOG) 0.1 % Apply 1 application topically 2 (two) times daily.   Yes [provider]  vitamin C (ASCORBIC ACID) 500 MG tablet Take 500 mg by mouth every other day.    Yes [provider]  allopurinol (ZYLOPRIM) 300 MG tablet Take 0.5 tablets (150 mg total) by mouth every evening. 11/29/17 02/02/18  Elodia Florence., MD  blood glucose meter kit and supplies KIT Dispense based on patient and insurance preference. Use up to four times daily as directed. (FOR ICD-9 250.00, 250.01). 07/30/17   Briscoe Deutscher, DO  glucose blood test strip Test QID dx code E11.9 12/24/17   Briscoe Deutscher, DO  lisinopril (PRINIVIL,ZESTRIL) 2.5 MG tablet Take 1 tablet (2.5 mg total) by mouth daily. Patient not taking: Reported on 05/24/2018 02/04/18   Donne Carmelia, MD  methotrexate (RHEUMATREX) 2.5 MG tablet TAKE 4 TABLETS(10 MG) BY MOUTH 1 TIME A WEEK Patient not taking: Reported on 05/24/2018 03/21/18   Briscoe Deutscher, DO  Olopatadine HCl 0.2 % SOLN Apply 1 drop to eye daily. Patient not taking: Reported on 05/24/2018 02/22/18   Briscoe Deutscher, DO  oxycodone (OXY-IR) 5 MG capsule Take 1 capsule (5 mg total) by mouth every 4 (four) hours as needed for pain. Patient not taking: Reported on 05/24/2018 12/03/17   Briscoe Deutscher, DO  oxyCODONE-acetaminophen (PERCOCET/ROXICET) 5-325 MG tablet Take 1 tablet by mouth every 4 (four) hours as needed for severe pain. Patient not taking: Reported on 05/24/2018 03/14/18   Briscoe Deutscher, DO  predniSONE (DELTASONE) 10 MG tablet Take 1 tablet (10 mg total) by mouth daily with breakfast. Patient not taking: Reported on 05/24/2018 02/22/18   Briscoe Deutscher, DO  ranitidine (ZANTAC) 150 MG tablet TAKE 1 TABLET(150 MG) BY MOUTH AT BEDTIME Patient not taking: Reported on 05/28/2018 04/11/18   Briscoe Deutscher, DO    Physical Exam: Vitals:   05/28/18 1759 05/28/18 1801 05/28/18 2032 05/28/18 2129  BP:  (!)  155/85 (!) 183/99 (!) 183/101  Pulse:  87 95 94  Resp:  18 (!) 23 (!) 21  Temp: 97.8 F (36.6 C) 97.8 F (36.6 C)    TempSrc: Rectal Rectal    SpO2:  98% 98% 100%    GENERAL: 84 y.o.-year-old female patient, well-developed, well-nourished lying in the bed in no acute distress.  Pleasant and cooperative.   HEENT: Head atraumatic, normocephalic. Pupils equal. Mucus membranes moist. NECK: Supple. No JVD. CHEST: Normal breath sounds bilaterally. No wheezing, rales, rhonchi or crackles. No use of accessory muscles of respiration.  No reproducible chest wall tenderness.  CARDIOVASCULAR: S1, S2 normal. No murmurs, rubs, or gallops. Cap  refill <2 seconds. Pulses intact distally.  ABDOMEN: Soft, nondistended, nontender. No rebound, guarding, rigidity. Normoactive bowel sounds present in all four quadrants.  EXTREMITIES: Positive mild bilateral pedal edema, no cyanosis, or clubbing. No calf tenderness or Homan's sign.  NEUROLOGIC: The patient is alert and oriented x 3. Cranial nerves II through XII are grossly intact with no focal sensorimotor deficit. PSYCHIATRIC:  Normal affect, mood, thought content. SKIN: Warm, dry, and intact without obvious rash, lesion, or ulcer.    Labs on Admission:  CBC: Recent Labs  Lab 05/24/18 0350 05/28/18 1900  WBC 9.6 9.7  NEUTROABS  --  7.5  HGB 10.7* 9.8*  HCT 33.4* 30.1*  MCV 95.4 92.6  PLT 340 831   Basic Metabolic Panel: Recent Labs  Lab 05/24/18 0350 05/28/18 1613  NA 141 141  K 3.8 4.1  CL 107 108  CO2 25 24  GLUCOSE 126* 154*  BUN 21 17  CREATININE 0.91 0.83  CALCIUM 8.8* 9.1   GFR: CrCl cannot be calculated (Unknown ideal weight.). Liver Function Tests: Recent Labs  Lab 05/28/18 1613  AST 23  ALT 20  ALKPHOS 81  BILITOT 0.6  PROT 6.8  ALBUMIN 3.3*   No results for input(s): LIPASE, AMYLASE in the last 168 hours. No results for input(s): AMMONIA in the last 168 hours. Coagulation Profile: No results for input(s): INR,  PROTIME in the last 168 hours. Cardiac Enzymes: No results for input(s): CKTOTAL, CKMB, CKMBINDEX, TROPONINI in the last 168 hours. BNP (last 3 results) No results for input(s): PROBNP in the last 8760 hours. HbA1C: No results for input(s): HGBA1C in the last 72 hours. CBG: Recent Labs  Lab 05/24/18 0205  GLUCAP 115*   Lipid Profile: No results for input(s): CHOL, HDL, LDLCALC, TRIG, CHOLHDL, LDLDIRECT in the last 72 hours. Thyroid Function Tests: No results for input(s): TSH, T4TOTAL, FREET4, T3FREE, THYROIDAB in the last 72 hours. Anemia Panel: No results for input(s): VITAMINB12, FOLATE, FERRITIN, TIBC, IRON, RETICCTPCT in the last 72 hours. Urine analysis:    Component Value Date/Time   COLORURINE YELLOW 05/28/2018 [place] 05/28/2018 1613   LABSPEC 1.004 (L) 05/28/2018 1613   PHURINE 6.0 05/28/2018 1613   GLUCOSEU NEGATIVE 05/28/2018 1613   HGBUR NEGATIVE 05/28/2018 1613   BILIRUBINUR NEGATIVE 05/28/2018 1613   BILIRUBINUR Negative 01/13/2017 [place] 05/28/2018 1613   PROTEINUR NEGATIVE 05/28/2018 1613   UROBILINOGEN 0.2 01/13/2017 1234   UROBILINOGEN 1.0 09/11/2015 0135   NITRITE NEGATIVE 05/28/2018 1613   LEUKOCYTESUR NEGATIVE 05/28/2018 1613   Sepsis Labs: _0 (procalcitonin:4,lacticidven:4) )No results found for this or any previous visit (from the past 240 hour(s)).   Radiological Exams on Admission: Dg Chest 2 View  Result Date: 05/28/2018 CLINICAL DATA:  Weakness 6 days with intermittent altered mental status. EXAM: CHEST - 2 VIEW COMPARISON:  11/16/2017 FINDINGS: Lungs are adequately inflated demonstrate airspace consolidation over the left midlung likely left upper lobe pneumonia. No evidence of effusion. Slight worsening moderate cardiomegaly. Remainder of the exam is unchanged IMPRESSION: Mild airspace opacification over the left upper lobe likely pneumonia. Slight worsening moderate cardiomegaly. Electronically  Signed   By: Marin Olp [name]   On: 05/28/2018 16:10   Ct Head Wo Contrast  Result Date: 05/28/2018 CLINICAL DATA:  Generalized weakness since 05/22/2018. Intermittent altered mental status. EXAM: CT HEAD WITHOUT CONTRAST TECHNIQUE: Contiguous axial images were obtained from the base of the skull through the vertex without intravenous contrast. COMPARISON:  11/29/2016. FINDINGS: Brain:  Diffusely enlarged ventricles and subarachnoid spaces. Patchy white matter low density in both cerebral hemispheres. No intracranial hemorrhage, mass lesion or CT evidence of acute infarction. Vascular: No hyperdense vessel or unexpected calcification. Skull: Bilateral hyperostosis frontalis. Sinuses/Orbits: Status post bilateral cataract extraction. Single residual opacified left ethmoid air cell. Resolved right mastoid air cell opacification. Other: None. IMPRESSION: 1. No acute abnormality. 2. Stable atrophy. 3. Progressive chronic small vessel white matter ischemic changes in both cerebral hemispheres. 4. Improved left ethmoid sinusitis. 5. Resolved right mastoid mucosal thickening and fluid. Electronically Signed   By: Claudie Revering [name]   On: 05/28/2018 15:56   Ct Angio Chest Pe W And/or Wo Contrast  Result Date: 05/28/2018 CLINICAL DATA:  Shortness of breath. EXAM: CT ANGIOGRAPHY CHEST WITH CONTRAST TECHNIQUE: Multidetector CT imaging of the chest was performed using the standard protocol during bolus administration of intravenous contrast. Multiplanar CT image reconstructions and MIPs were obtained to evaluate the vascular anatomy. CONTRAST:  100 mL ISOVUE-370 IOPAMIDOL (ISOVUE-370) INJECTION 76% COMPARISON:  Chest CT angiogram September 11, 2015; chest radiograph May 28, 2018 FINDINGS: Cardiovascular: There is no demonstrable pulmonary embolus. There is no thoracic aortic aneurysm. No dissection is seen in the thoracic aorta. Note that the contrast bolus in the aorta is less than optimal for assessment for potential  dissection. Visualized great vessels appear unremarkable except for slight calcification in the left common carotid artery. Note that the left common carotid and right innominate arteries arise as a common trunk, an anatomic variant. There is no appreciable pericardial effusion or pericardial thickening. There is aortic atherosclerosis. There are foci of coronary artery calcification. Mediastinum/Nodes: There is a subcentimeter nodular opacity in the left lobe of the thyroid. No dominant thyroid mass evident. There is no evident thoracic adenopathy. No esophageal lesions are appreciable. Lungs/Pleura: There is airspace consolidation in the anterior segment left upper lobe consistent with pneumonia. There is atelectatic change in each lower lobe with a small right pleural effusion evident. On axial slice 69 series 11, there is a 5 mm nodular opacity in the lateral segment of the right middle lobe. There is a nodular opacity in the anterior segment of the right upper lobe measuring 1.5 x 1.1 cm. This nodular opacity is best seen on coronal slice 88 series 7 but is also appreciable on sagittal slice 61 series 8 and axial slice 23 series 11. Upper Abdomen: In the visualized upper abdominal region, there is mild reflux into the inferior vena cava and hepatic veins suggesting increase in right heart pressure. There is atherosclerotic calcification in the aorta and proximal major mesenteric arterial vessels. There is a cyst arising from the lateral upper pole right kidney measuring 5.2 x 5.1 cm. Musculoskeletal: There is degenerative change in the thoracic spine. There are no blastic or lytic bone lesions. There is extensive arthropathy in each shoulder. No evident chest wall lesions. Review of the MIP images confirms the above findings. IMPRESSION: 1. There is a 1.5 x 1.1 cm nodular opacity in the anterior segment of the right upper lobe. Consider one of the following in 3 months for both low-risk and high-risk  individuals: (a) repeat chest CT, (b) follow-up PET-CT, or (c) tissue sampling. This recommendation follows the consensus statement: Guidelines for Management of Incidental Pulmonary Nodules Detected on CT Images: From the Fleischner Society 2017; Radiology 2017; 284:228-243. 2. Anterior segment left upper lobe airspace consolidation consistent with pneumonia. 3. No demonstrable pulmonary embolus. No thoracic aortic aneurysm. No dissection seen. It should be noted  that the contrast bolus in the aorta is insufficient to exclude dissection confidently as a differential consideration by radiography. 4. There is aortic atherosclerosis. There are foci of coronary artery calcification. There are foci of great vessel and mesenteric arterial vessel calcification as well. 5.  Small right pleural effusion with bibasilar atelectasis. 6. Subcentimeter left thyroid nodule. Per consensus guidelines, this lesion does not warrant additional imaging surveillance. 7.  No demonstrable thoracic adenopathy. 8. Reflux into the inferior vena cava and hepatic veins may be indicative of a degree of increase in right heart pressure. Aortic Atherosclerosis (ICD10-I70.0). Electronically Signed   By: Lowella Grip III [name]   On: 05/28/2018 19:08    EKG: Pending  Assessment/Plan  This is a 84 y.o. female with a history of CVA, HTN, DM, HLD, CHF, CKD, RA, anemia, seizur now being admitted with:  #. Acute exacerbation of congestive heart failure - Telemetry monitoring. - Beta blocker, ACE, diuretic, nitrate.  - Intake/output, daily weight. - Trend troponins, check lipids and TSH. - Echo  #. .Community Acquired Pneumonia - Admit to inpatient - IV Rocephin & Azithromycin per pharmacy - IV fluid hydration - Duonebs, expectorants & O2 therapy as needed - Follow up blood & sputum cultures - Lung nodules will need to be followed.   #. Anemia, normocytic - Check FOBT, ferritin, B12, folate  #. History of diabetes -  Accuchecks achs with RISS coverage - Heart healthy, carb controlled diet - Hold metformin  #. History of uncontrolled HTN - Continue hydralazine, labetalol, lisinopril  #. History of RA - Continue Plaquenil, prednisone.   Admission status: Inpatient IV Fluids: HL Diet/Nutrition: HH, CC Consults called: None  DVT Px: Lovenox, SCDs and early ambulation. Code Status: Full Code  Disposition Plan: To home in 2-3 days  All the records are reviewed and case discussed with ED provider. Management plans discussed with the patient and/or family who express understanding and agree with plan of care.  Peretz Thieme D.O. on 05/28/2018 at 9:36 PM CC: Primary care physician; Briscoe Deutscher, DO   05/28/2018, 9:36 PM

## 2018-05-28 NOTE — ED Notes (Signed)
Bed: [id number] Expected date: 05/28/18 Expected time: 1:34 PM Means of arrival: Ambulance Comments: Weakness

## 2018-05-28 NOTE — ED Notes (Signed)
ED TO INPATIENT HANDOFF REPORT  Name/Age/Gender [name] [name] 84 y.o. female  Code Status Code Status History    Date Active Date Inactive Code Status Order ID Comments User Context   02/02/2018 0606 02/04/2018 1957 DNR [id number]  Norval Morton, MD ED   11/26/2017 2055 11/29/2017 1715 DNR [id number]  Elodia Florence., MD Inpatient   05/25/2017 1811 05/27/2017 1956 DNR [id number]  Caren Griffins, MD Inpatient   11/29/2016 1522 12/01/2016 1509 Full Code [id number]  Elwin Mocha, MD ED   09/11/2015 0454 09/12/2015 1432 Full Code [id number]  Etta Quill, DO ED   06/19/2015 2338 06/21/2015 1941 Full Code [id number]  Etta Quill, DO ED   01/25/2014 2245 06/19/2015 2338 Full Code [id number]  Hennie Duos, MD Outpatient   01/17/2014 1305 01/22/2014 2142 Full Code [id number]  Allie Bossier, MD ED    Questions for Most Recent Historical Code Status (Order [id number])    Question Answer Comment   In the event of cardiac or respiratory ARREST Do not call a "code blue"    In the event of cardiac or respiratory ARREST Do not perform Intubation, CPR, defibrillation or ACLS    In the event of cardiac or respiratory ARREST Use medication by any route, position, wound care, and other measures to relive pain and suffering. May use oxygen, suction and manual treatment of airway obstruction as needed for comfort.       Home/SNF/Other Nursing Home  Chief Complaint generalized weakness  Level of Care/Admitting Diagnosis ED Disposition    ED Disposition Condition Comment   Admit  Hospital Area: [facility] [[id number]]  Level of Care: Telemetry [5]  Admit to tele based on following criteria: Acute CHF  Diagnosis: Congestive heart failure (CHF) [facility]) [[id number]]  Admitting Physician: Harvie Bridge [[id number]]  Attending Physician: Sherron Monday  Estimated length of stay: past midnight tomorrow  Certification:: I certify this patient will need inpatient  services for at least 2 midnights  PT Class (Do Not Modify): Inpatient [101]  PT Acc Code (Do Not Modify): Private [1]       Medical History Past Medical History:  Diagnosis Date  . Allergic rhinitis due to pollen 04/27/2007  . Arthritis    "in q joint" (08/07/2013)  . Coronary atherosclerosis of native coronary artery   . Cough   . Disorder of bone and cartilage, unspecified   . Edema 05/03/2013  . Exertional shortness of breath    "sometimes" (08/07/2013)  . First degree atrioventricular block   . Gout, unspecified 04/19/2013  . Hyperlipidemia 04/27/2007  . Hypertension   . Muscle spasm   . Muscle weakness (generalized)   . Myocardial infarction ([place]) ~ 1970  . Onychia and paronychia of toe   . Osteoarthrosis, unspecified whether generalized or localized, unspecified site 04/27/2007  . Other fall   . Other malaise and fatigue   . Prepatellar bursitis   . Seizures ([place])   . Stroke ([place]) 01/17/2014  . TIA (transient ischemic attack)    "a few one summer" (08/07/2013)  . Type II diabetes mellitus ([place])    "fasting 90-110s" (08/07/2013)  . Unspecified essential hypertension 08/08/2013  . Unspecified vitamin D deficiency     Allergies Allergies  Allergen Reactions  . Clonidine Derivatives Swelling    [name]  . Fish Allergy Diarrhea, Swelling and Other (See Comments)    Turns skin "black," but can tolerate white  fish Salmon- Diarrhea  . Shellfish Allergy Hives  . Doxycycline Rash  . Indomethacin     Reaction not recalled by the patient  . Lyrica [Pregabalin]     Hallucinations   . Methyldopa     Aldomet (for hypertension): Reaction not recalled by the patient  . Orange Fruit [Citrus] Other (See Comments)    Indigestion/heartburn  . Cetirizine Hcl Itching and Rash  . Codeine Itching  . Levaquin [Levofloxacin In D5w] Rash  . Tomato Rash    IV Location/Drains/Wounds Patient  Lines/Drains/Airways Status   Active Line/Drains/Airways    Name:   Placement date:   Placement time:   Site:   Days:   Peripheral IV 05/28/18 Left Hand   05/28/18    1315    Hand   less than 1   Peripheral IV 05/28/18 Right;Lateral Forearm   05/28/18    1820    Forearm   less than 1          Labs/Imaging Results for orders placed or performed during the hospital encounter of 05/28/18 (from the past 48 hour(s))  Comprehensive metabolic panel     Status: Abnormal   Collection Time: 05/28/18  4:13 PM  Result Value Ref Range   Sodium 141 135 - 145 mmol/L   Potassium 4.1 3.5 - 5.1 mmol/L   Chloride 108 98 - 111 mmol/L    Comment: Please note change in reference range.   CO2 24 22 - 32 mmol/L   Glucose, Bld 154 (H) 70 - 99 mg/dL    Comment: Please note change in reference range.   BUN 17 8 - 23 mg/dL    Comment: Please note change in reference range.   Creatinine, Ser 0.83 0.44 - 1.00 mg/dL   Calcium 9.1 8.9 - 10.3 mg/dL   Total Protein 6.8 6.5 - 8.1 g/dL   Albumin 3.3 (L) 3.5 - 5.0 g/dL   AST 23 15 - 41 U/L   ALT 20 0 - 44 U/L    Comment: Please note change in reference range.   Alkaline Phosphatase 81 38 - 126 U/L   Total Bilirubin 0.6 0.3 - 1.2 mg/dL   GFR calc non Af Amer >60 >60 mL/min   GFR calc Af Amer >60 >60 mL/min    Comment: (NOTE) The eGFR has been calculated using the CKD EPI equation. This calculation has not been validated in all clinical situations. eGFR's persistently <60 mL/min signify possible Chronic Kidney Disease.    Anion gap 9 5 - 15    Comment: Performed at [facility], [place] [address] [place], [place] [address]  Urinalysis, Routine w reflex microscopic     Status: Abnormal   Collection Time: 05/28/18  4:13 PM  Result Value Ref Range   Color, Urine YELLOW YELLOW   APPearance CLEAR CLEAR   Specific Gravity, Urine 1.004 (L) 1.005 - 1.030   pH 6.0 5.0 - 8.0   Glucose, UA NEGATIVE NEGATIVE mg/dL   Hgb urine dipstick NEGATIVE  NEGATIVE   Bilirubin Urine NEGATIVE NEGATIVE   Ketones, ur NEGATIVE NEGATIVE mg/dL   Protein, ur NEGATIVE NEGATIVE mg/dL   Nitrite NEGATIVE NEGATIVE   Leukocytes, UA NEGATIVE NEGATIVE    Comment: Performed at [facility] [address] [place], [place] [address]  Brain natriuretic peptide     Status: Abnormal   Collection Time: 05/28/18  4:14 PM  Result Value Ref Range   B Natriuretic Peptide 2,077.6 (H) 0.0 - 100.0 pg/mL  Comment: Performed at [facility], [place] [address] [place], [place] [address]  I-Stat Troponin, ED (not at [facility])     Status: None   Collection Time: 05/28/18  4:22 PM  Result Value Ref Range   Troponin i, poc 0.04 0.00 - 0.08 ng/mL   Comment 3            Comment: Due to the release kinetics of cTnI, a negative result within the first hours of the onset of symptoms does not rule out myocardial infarction with certainty. If myocardial infarction is still suspected, repeat the test at appropriate intervals.   I-Stat CG4 Lactic Acid, ED     Status: None   Collection Time: 05/28/18  5:21 PM  Result Value Ref Range   Lactic Acid, Venous 0.84 0.5 - 1.9 mmol/L  CBC with Differential/Platelet     Status: Abnormal   Collection Time: 05/28/18  7:00 PM  Result Value Ref Range   WBC 9.7 4.0 - 10.5 K/uL   RBC 3.25 (L) 3.87 - 5.11 MIL/uL   Hemoglobin 9.8 (L) 12.0 - 15.0 g/dL   HCT 30.1 (L) 36.0 - 46.0 %   MCV 92.6 78.0 - 100.0 fL   MCH 30.2 26.0 - 34.0 pg   MCHC 32.6 30.0 - 36.0 g/dL   RDW 17.4 (H) 11.5 - 15.5 %   Platelets 368 150 - 400 K/uL   Neutrophils Relative % 78 %   Neutro Abs 7.5 1.7 - 7.7 K/uL   Lymphocytes Relative 16 %   Lymphs Abs 1.6 0.7 - 4.0 K/uL   Monocytes Relative 5 %   Monocytes Absolute 0.5 0.1 - 1.0 K/uL   Eosinophils Relative 1 %   Eosinophils Absolute 0.1 0.0 - 0.7 K/uL   Basophils Relative 0 %   Basophils Absolute 0.0 0.0 - 0.1 K/uL    Comment: Performed at [facility], [place]  [address], [place], [place] [address]   Dg Chest 2 View  Result Date: 05/28/2018 CLINICAL DATA:  Weakness 6 days with intermittent altered mental status. EXAM: CHEST - 2 VIEW COMPARISON:  11/16/2017 FINDINGS: Lungs are adequately inflated demonstrate airspace consolidation over the left midlung likely left upper lobe pneumonia. No evidence of effusion. Slight worsening moderate cardiomegaly. Remainder of the exam is unchanged IMPRESSION: Mild airspace opacification over the left upper lobe likely pneumonia. Slight worsening moderate cardiomegaly. Electronically Signed   By: Marin Olp [name]   On: 05/28/2018 16:10   Ct Head Wo Contrast  Result Date: 05/28/2018 CLINICAL DATA:  Generalized weakness since 05/22/2018. Intermittent altered mental status. EXAM: CT HEAD WITHOUT CONTRAST TECHNIQUE: Contiguous axial images were obtained from the base of the skull through the vertex without intravenous contrast. COMPARISON:  11/29/2016. FINDINGS: Brain: Diffusely enlarged ventricles and subarachnoid spaces. Patchy white matter low density in both cerebral hemispheres. No intracranial hemorrhage, mass lesion or CT evidence of acute infarction. Vascular: No hyperdense vessel or unexpected calcification. Skull: Bilateral hyperostosis frontalis. Sinuses/Orbits: Status post bilateral cataract extraction. Single residual opacified left ethmoid air cell. Resolved right mastoid air cell opacification. Other: None. IMPRESSION: 1. No acute abnormality. 2. Stable atrophy. 3. Progressive chronic small vessel white matter ischemic changes in both cerebral hemispheres. 4. Improved left ethmoid sinusitis. 5. Resolved right mastoid mucosal thickening and fluid. Electronically Signed   By: Claudie Revering [name]   On: 05/28/2018 15:56   Ct Angio Chest Pe W And/or Wo Contrast  Result Date: 05/28/2018 CLINICAL DATA:  Shortness of breath. EXAM: CT  ANGIOGRAPHY CHEST WITH CONTRAST TECHNIQUE: Multidetector CT imaging of the chest was  performed using the standard protocol during bolus administration of intravenous contrast. Multiplanar CT image reconstructions and MIPs were obtained to evaluate the vascular anatomy. CONTRAST:  100 mL ISOVUE-370 IOPAMIDOL (ISOVUE-370) INJECTION 76% COMPARISON:  Chest CT angiogram September 11, 2015; chest radiograph May 28, 2018 FINDINGS: Cardiovascular: There is no demonstrable pulmonary embolus. There is no thoracic aortic aneurysm. No dissection is seen in the thoracic aorta. Note that the contrast bolus in the aorta is less than optimal for assessment for potential dissection. Visualized great vessels appear unremarkable except for slight calcification in the left common carotid artery. Note that the left common carotid and right innominate arteries arise as a common trunk, an anatomic variant. There is no appreciable pericardial effusion or pericardial thickening. There is aortic atherosclerosis. There are foci of coronary artery calcification. Mediastinum/Nodes: There is a subcentimeter nodular opacity in the left lobe of the thyroid. No dominant thyroid mass evident. There is no evident thoracic adenopathy. No esophageal lesions are appreciable. Lungs/Pleura: There is airspace consolidation in the anterior segment left upper lobe consistent with pneumonia. There is atelectatic change in each lower lobe with a small right pleural effusion evident. On axial slice 69 series 11, there is a 5 mm nodular opacity in the lateral segment of the right middle lobe. There is a nodular opacity in the anterior segment of the right upper lobe measuring 1.5 x 1.1 cm. This nodular opacity is best seen on coronal slice 88 series 7 but is also appreciable on sagittal slice 61 series 8 and axial slice 23 series 11. Upper Abdomen: In the visualized upper abdominal region, there is mild reflux into the inferior vena cava and hepatic veins suggesting increase in right heart pressure. There is atherosclerotic calcification in the  aorta and proximal major mesenteric arterial vessels. There is a cyst arising from the lateral upper pole right kidney measuring 5.2 x 5.1 cm. Musculoskeletal: There is degenerative change in the thoracic spine. There are no blastic or lytic bone lesions. There is extensive arthropathy in each shoulder. No evident chest wall lesions. Review of the MIP images confirms the above findings. IMPRESSION: 1. There is a 1.5 x 1.1 cm nodular opacity in the anterior segment of the right upper lobe. Consider one of the following in 3 months for both low-risk and high-risk individuals: (a) repeat chest CT, (b) follow-up PET-CT, or (c) tissue sampling. This recommendation follows the consensus statement: Guidelines for Management of Incidental Pulmonary Nodules Detected on CT Images: From the Fleischner Society 2017; Radiology 2017; 284:228-243. 2. Anterior segment left upper lobe airspace consolidation consistent with pneumonia. 3. No demonstrable pulmonary embolus. No thoracic aortic aneurysm. No dissection seen. It should be noted that the contrast bolus in the aorta is insufficient to exclude dissection confidently as a differential consideration by radiography. 4. There is aortic atherosclerosis. There are foci of coronary artery calcification. There are foci of great vessel and mesenteric arterial vessel calcification as well. 5.  Small right pleural effusion with bibasilar atelectasis. 6. Subcentimeter left thyroid nodule. Per consensus guidelines, this lesion does not warrant additional imaging surveillance. 7.  No demonstrable thoracic adenopathy. 8. Reflux into the inferior vena cava and hepatic veins may be indicative of a degree of increase in right heart pressure. Aortic Atherosclerosis (ICD10-I70.0). Electronically Signed   By: Lowella Grip III [name]   On: 05/28/2018 19:08    Pending Labs [employer] (From admission, onward)   Start  Ordered   05/28/18 1617  Blood culture (routine x 2)  BLOOD  CULTURE X 2,   STAT     05/28/18 1616   05/28/18 1526  Urine culture  STAT,   STAT     05/28/18 1526   05/28/18 1525  CBC with Differential  STAT,   STAT     05/28/18 1526   Signed and Held  Basic metabolic panel  Daily,   R     Signed and Held   Signed and Held  CBC  (enoxaparin (LOVENOX)    CrCl >/= 30 ml/min)  Once,   R    Comments:  Baseline for enoxaparin therapy IF NOT ALREADY DRAWN.  Notify MD if PLT < 100 K.    Signed and Held   Signed and Held  Creatinine, serum  (enoxaparin (LOVENOX)    CrCl >/= 30 ml/min)  Once,   R    Comments:  Baseline for enoxaparin therapy IF NOT ALREADY DRAWN.    Signed and Held   Signed and Held  Creatinine, serum  (enoxaparin (LOVENOX)    CrCl >/= 30 ml/min)  Weekly,   R    Comments:  while on enoxaparin therapy    Signed and Held   Signed and Held  Troponin I  Now then every 6 hours,   R     Signed and Held   Signed and Held  TSH  Once,   R     Signed and Held   Signed and Held  Magnesium  Once,   R     Signed and Held   Signed and Held  Lipid panel  Once,   R     Signed and Held   Signed and Held  Occult blood card to lab, stool  As needed,   R     Signed and Held   Signed and Held  Vitamin B12  Once,   R     Signed and Held   Signed and Held  Folate RBC  Once,   R     Signed and Held   Signed and Held  Ferritin  Once,   R     Signed and Held   Signed and Held  Iron and TIBC  Once,   R     Signed and Held      Vitals/Pain Today's Vitals   05/28/18 1801 05/28/18 2032 05/28/18 2129 05/28/18 2200  BP: (!) 155/85 (!) 183/99 (!) 183/101 (!) 155/99  Pulse: 87 95 94 (!) 101  Resp: 18 (!) 23 (!) 21 (!) 26  Temp: 97.8 F (36.6 C)     TempSrc: Rectal     SpO2: 98% 98% 100% 99%  PainSc:        Isolation Precautions No active isolations  Medications Medications  iopamidol (ISOVUE-370) 76 % injection (has no administration in time range)  labetalol (NORMODYNE) tablet 100 mg (100 mg Oral Given 05/28/18 1624)  hydrALAZINE (APRESOLINE)  tablet 100 mg (100 mg Oral Given 05/28/18 1625)  sodium chloride 0.9 % bolus 1,000 mL (0 mLs Intravenous Stopped 05/28/18 2108)  cefTRIAXone (ROCEPHIN) 1 g in sodium chloride 0.9 % 100 mL IVPB (0 g Intravenous Stopped 05/28/18 1927)  azithromycin (ZITHROMAX) 500 mg in sodium chloride 0.9 % 250 mL IVPB (0 mg Intravenous Stopped 05/28/18 2255)  furosemide (LASIX) injection 40 mg (40 mg Intravenous Given 05/28/18 1816)  iopamidol (ISOVUE-370) 76 % injection 100 mL (100 mLs Intravenous Contrast Given 05/28/18 1832)  Mobility walks with device

## 2018-05-29 ENCOUNTER — Inpatient Hospital Stay (HOSPITAL_COMMUNITY): Payer: Medicare Other

## 2018-05-29 ENCOUNTER — Other Ambulatory Visit: Payer: Self-pay

## 2018-05-29 ENCOUNTER — Encounter (HOSPITAL_COMMUNITY): Payer: Self-pay

## 2018-05-29 LAB — FERRITIN: Ferritin: 103 ng/mL (ref 11–307)

## 2018-05-29 LAB — BLOOD GAS, ARTERIAL
Acid-Base Excess: 3.1 mmol/L — ABNORMAL HIGH (ref 0.0–2.0)
Bicarbonate: 26.1 mmol/L (ref 20.0–28.0)
Drawn by: 331471
O2 Saturation: 98.9 %
Patient temperature: 98.6
pCO2 arterial: 35.3 mmHg (ref 32.0–48.0)
pH, Arterial: 7.482 — ABNORMAL HIGH (ref 7.350–7.450)
pO2, Arterial: 129 mmHg — ABNORMAL HIGH (ref 83.0–108.0)

## 2018-05-29 LAB — IRON AND TIBC
Iron: 20 ug/dL — ABNORMAL LOW (ref 28–170)
Saturation Ratios: 7 % — ABNORMAL LOW (ref 10.4–31.8)
TIBC: 269 ug/dL (ref 250–450)
UIBC: 249 ug/dL

## 2018-05-29 LAB — ECHOCARDIOGRAM COMPLETE
Height: 64 in
Weight: 3051.17 oz

## 2018-05-29 LAB — URINE CULTURE: Culture: NO GROWTH

## 2018-05-29 LAB — LIPID PANEL
Cholesterol: 178 mg/dL (ref 0–200)
HDL: 73 mg/dL (ref >40)
LDL Cholesterol: 92 mg/dL (ref 0–99)
Total CHOL/HDL Ratio: 2.4 RATIO
Triglycerides: 64 mg/dL (ref <150)
VLDL: 13 mg/dL (ref 0–40)

## 2018-05-29 LAB — GLUCOSE, CAPILLARY
Glucose-Capillary: 108 mg/dL — ABNORMAL HIGH (ref 70–99)
Glucose-Capillary: 115 mg/dL — ABNORMAL HIGH (ref 70–99)
Glucose-Capillary: 137 mg/dL — ABNORMAL HIGH (ref 70–99)
Glucose-Capillary: 96 mg/dL (ref 70–99)
Glucose-Capillary: 97 mg/dL (ref 70–99)

## 2018-05-29 LAB — BASIC METABOLIC PANEL
Anion gap: 11 (ref 5–15)
BUN: 15 mg/dL (ref 8–23)
CO2: 25 mmol/L (ref 22–32)
Calcium: 9 mg/dL (ref 8.9–10.3)
Chloride: 105 mmol/L (ref 98–111)
Creatinine, Ser: 0.85 mg/dL (ref 0.44–1.00)
GFR calc Af Amer: >60 mL/min (ref >60)
GFR calc non Af Amer: >60 mL/min (ref >60)
Glucose, Bld: 108 mg/dL — ABNORMAL HIGH (ref 70–99)
Potassium: 3.3 mmol/L — ABNORMAL LOW (ref 3.5–5.1)
Sodium: 141 mmol/L (ref 135–145)

## 2018-05-29 LAB — TROPONIN I
Troponin I: 0.06 ng/mL (ref <0.03)
Troponin I: 0.05 ng/mL (ref <0.03)
Troponin I: 0.06 ng/mL (ref <0.03)

## 2018-05-29 LAB — MAGNESIUM: Magnesium: 1.7 mg/dL (ref 1.7–2.4)

## 2018-05-29 LAB — VITAMIN B12: Vitamin B-12: 433 pg/mL (ref 180–914)

## 2018-05-29 LAB — TSH: TSH: 2.029 u[IU]/mL (ref 0.350–4.500)

## 2018-05-29 MED ORDER — ALBUTEROL SULFATE (2.5 MG/3ML) 0.083% IN NEBU
2.5000 mg | INHALATION_SOLUTION | Freq: Two times a day (BID) | RESPIRATORY_TRACT | Status: DC
  Filled 2018-05-29: qty 3

## 2018-05-29 MED ORDER — FERROUS SULFATE 325 (65 FE) MG PO TABS
325.0000 mg | ORAL_TABLET | Freq: Two times a day (BID) | ORAL | Status: DC
  Administered 2018-05-30 – 2018-06-04 (×10): 325 mg via ORAL
  Filled 2018-05-29 (×12): qty 1

## 2018-05-29 MED ORDER — ALBUTEROL SULFATE (2.5 MG/3ML) 0.083% IN NEBU: 2.5000 mg | INHALATION_SOLUTION | RESPIRATORY_TRACT | Status: DC | PRN

## 2018-05-29 MED ORDER — IPRATROPIUM-ALBUTEROL 0.5-2.5 (3) MG/3ML IN SOLN
3.0000 mL | Freq: Four times a day (QID) | RESPIRATORY_TRACT | Status: DC
  Administered 2018-05-29 (×3): 3 mL via RESPIRATORY_TRACT
  Filled 2018-05-29 (×2): qty 3

## 2018-05-29 MED ORDER — GUAIFENESIN-DM 100-10 MG/5ML PO SYRP
5.0000 mL | ORAL_SOLUTION | ORAL | Status: DC | PRN
  Administered 2018-05-29: 5 mL via ORAL
  Filled 2018-05-29: qty 10

## 2018-05-29 MED ORDER — ALBUTEROL SULFATE (2.5 MG/3ML) 0.083% IN NEBU: 2.5000 mg | INHALATION_SOLUTION | Freq: Four times a day (QID) | RESPIRATORY_TRACT | Status: DC | PRN

## 2018-05-29 MED ORDER — DEXTROMETHORPHAN POLISTIREX ER 30 MG/5ML PO SUER
30.0000 mg | Freq: Two times a day (BID) | ORAL | Status: DC
  Administered 2018-05-29: 30 mg via ORAL
  Filled 2018-05-29 (×2): qty 5

## 2018-05-29 MED ORDER — IPRATROPIUM-ALBUTEROL 0.5-2.5 (3) MG/3ML IN SOLN
3.0000 mL | Freq: Three times a day (TID) | RESPIRATORY_TRACT | Status: DC
  Administered 2018-05-30: 3 mL via RESPIRATORY_TRACT
  Filled 2018-05-29: qty 3

## 2018-05-29 MED ORDER — POTASSIUM CHLORIDE CRYS ER 20 MEQ PO TBCR
40.0000 meq | EXTENDED_RELEASE_TABLET | Freq: Once | ORAL | Status: AC
  Administered 2018-05-29: 40 meq via ORAL
  Filled 2018-05-29: qty 2

## 2018-05-29 MED ORDER — SODIUM CHLORIDE 3 % IN NEBU
4.0000 mL | INHALATION_SOLUTION | Freq: Three times a day (TID) | RESPIRATORY_TRACT | Status: DC
  Administered 2018-05-29 – 2018-05-30 (×4): 4 mL via RESPIRATORY_TRACT
  Filled 2018-05-29 (×5): qty 4

## 2018-05-29 MED ORDER — GUAIFENESIN ER 600 MG PO TB12
1200.0000 mg | ORAL_TABLET | Freq: Two times a day (BID) | ORAL | Status: DC
  Administered 2018-05-29 (×2): 1200 mg via ORAL
  Filled 2018-05-29 (×3): qty 2

## 2018-05-29 NOTE — Progress Notes (Addendum)
PROGRESS NOTE  [name] [name] KHT:[id number] DOB: [birth year] DOA: 05/28/2018 PCP: Helane Rima, DO  HPI/Recap of past 24 hours: [name] [name] is a 84 y.o. female with a known history of CVA, HTN, DM, HLD, CHF, CKD 2, RA, anemia, seizure presents to the emergency department for evaluation of weakness.  Patient was in a usual state of health until one week ago when she reports the onset of weakness.  She was seen in the ER four days ago and discharged home after observation.  She reports localized chest pain associated with SOB, and confusion.  Admitted for acute CHF exacerbation and community-acquired pneumonia.  05/29/2018: Patient seen and examined with her daughter at bedside.  She reports persistent dyspnea at rest and generalized weakness.  Denies any chest pain or palpitations.  2D echo done on 05/29/2018 revealed new systolic CHF.    Assessment/Plan: Active Problems:   Congestive heart failure (CHF) (HCC)  Acute on chronic diastolic CHF Continue diuretics Continue to monitor I&Os Continue to monitor daily weight Repeat BMP Continue cardiac medications Troponins flat  New acute systolic CHF 2D echo done on 05/29/2018 revealed LVEF 20 to 25% Management as stated above Cardiology consulted and will see in the morning Appears hemodynamically stable Denies chest pain  Acute metabolic encephalopathy, most likely multifactorial 2/2 to acute systolic CHF vs others CT head negative, TSH unremarkable O2 sat ok Reorient as needed Fall precautions  Community-acquired pneumonia, present on admission Continue AV azithromycin and IV Rocephin Avoid IV fluid hydration due to systolic heart failure Start duo nebs every 6 hours and every 2 hours as needed Start hypersaline nebs Start mucinex 1200 mg BID Start dexometorphan 30 mg BID for cough Blood cultures and sputum cultures are pending  Small R pleural effusion on CT chest/trivial pericardial effusion on 2D echo Monitor volume  status  Left to right shunt from small patent foramen ovale on 2D echo done on 05/29/18. Defer to cardiology  Lung nodules incidentally found on CT chest Follow-up outpatient  Hypokalemia K+ 3.3 Repleted Repeat BMP in the am  Elevated troponin Flat. Peaked at 0.06 No chest pain Independenltly reviewed 12 leads EKG: SR- no specific ST T changes  Iron deficiency anemia/Chronic normocytic anemia No sign of overt bleeding Obtain FOBT Lab studies confirm iron deficiency Start ferrous sulfate 325 mg BID  Type 2 diabetes Continue to hold metformin Obtain A1C Last A1C (11/16/17) 5.4 Avoid hypoglycemia  Hypertension BP stable Resume antihypertensive medications  History of RA We will hold DMARDs due to active infection with community-acquired pneumonia  Goals of care Palliative care team consulted to establish goals of care  Code Status: Full code  Family Communication: Daughter at bedside.  All questions answered to her satisfaction.  Disposition Plan: Home versus SNF when hemodynamically stable and when cardiology signs off.   Consultants:  Cardiology will see in the morning 05/30/2018  Procedures:  None  Antimicrobials:  IV rocephin and IV azithromycin  DVT prophylaxis:  sq lovenox daily   Objective: Vitals:   05/29/18 0502 05/29/18 1032 05/29/18 1037 05/29/18 1449  BP: (!) 156/100   120/65  Pulse: (!) 101   89  Resp: 18   20  Temp: 98.2 F (36.8 C)   98.6 F (37 C)  TempSrc: Oral   Oral  SpO2: 100% 99% 99% 99%  Weight:      Height:        Intake/Output Summary (Last 24 hours) at 05/29/2018 1451 Last data filed at 05/29/2018 0454 Gross  per 24 hour  Intake -  Output 700 ml  Net -700 ml   Filed Weights   05/28/18 2346 05/29/18 0455  Weight: 86.5 kg (190 lb 11.2 oz) 86.5 kg (190 lb 11.2 oz)    Exam:  . General: 84 y.o. year-old female well developed well nourished in no acute distress.  Alert but minimally interactive. . Cardiovascular:  Regular rate and rhythm with no rubs or gallops.  No thyromegaly or JVD noted.  1+ pitting edema in LE bilaterally. Marland Kitchen Respiratory: Diffused rales bilaterally with no wheezes. Poor inspiratory effort. . Abdomen: Soft nontender nondistended with normal bowel sounds x4 quadrants. . Psychiatry: Unable to determine her baseline. Minimally interactive.   Data Reviewed: CBC: Recent Labs  Lab 05/24/18 0350 05/28/18 1900  WBC 9.6 9.7  NEUTROABS  --  7.5  HGB 10.7* 9.8*  HCT 33.4* 30.1*  MCV 95.4 92.6  PLT 340 368   Basic Metabolic Panel: Recent Labs  Lab 05/24/18 0350 05/28/18 1613 05/29/18 0030 05/29/18 0707  NA 141 141  --  141  K 3.8 4.1  --  3.3*  CL 107 108  --  105  CO2 25 24  --  25  GLUCOSE 126* 154*  --  108*  BUN 21 17  --  15  CREATININE 0.91 0.83  --  0.85  CALCIUM 8.8* 9.1  --  9.0  MG  --   --  1.7  --    GFR: Estimated Creatinine Clearance: 50.6 mL/min (by C-G formula based on SCr of 0.85 mg/dL). Liver Function Tests: Recent Labs  Lab 05/28/18 1613  AST 23  ALT 20  ALKPHOS 81  BILITOT 0.6  PROT 6.8  ALBUMIN 3.3*   No results for input(s): LIPASE, AMYLASE in the last 168 hours. No results for input(s): AMMONIA in the last 168 hours. Coagulation Profile: No results for input(s): INR, PROTIME in the last 168 hours. Cardiac Enzymes: Recent Labs  Lab 05/29/18 0030 05/29/18 0707 05/29/18 1128  TROPONINI 0.06* 0.05* 0.06*   BNP (last 3 results) No results for input(s): PROBNP in the last 8760 hours. HbA1C: No results for input(s): HGBA1C in the last 72 hours. CBG: Recent Labs  Lab 05/24/18 0205 05/29/18 0002 05/29/18 0734 05/29/18 1153  GLUCAP 115* 96 108* 137*   Lipid Profile: Recent Labs    05/29/18 0030  CHOL 178  HDL 73  LDLCALC 92  TRIG 64  CHOLHDL 2.4   Thyroid Function Tests: Recent Labs    05/29/18 0030  TSH 2.029   Anemia Panel: Recent Labs    05/29/18 0030  VITAMINB12 433  FERRITIN 103  TIBC 269  IRON 20*    Urine analysis:    Component Value Date/Time   COLORURINE YELLOW 05/28/2018 1613   APPEARANCEUR CLEAR 05/28/2018 1613   LABSPEC 1.004 (L) 05/28/2018 1613   PHURINE 6.0 05/28/2018 1613   GLUCOSEU NEGATIVE 05/28/2018 1613   HGBUR NEGATIVE 05/28/2018 1613   BILIRUBINUR NEGATIVE 05/28/2018 1613   BILIRUBINUR Negative 01/13/2017 1234   KETONESUR NEGATIVE 05/28/2018 1613   PROTEINUR NEGATIVE 05/28/2018 1613   UROBILINOGEN 0.2 01/13/2017 1234   UROBILINOGEN 1.0 09/11/2015 0135   NITRITE NEGATIVE 05/28/2018 1613   LEUKOCYTESUR NEGATIVE 05/28/2018 1613   Sepsis Labs: @LABRCNTIP (procalcitonin:4,lacticidven:4)  ) Recent Results (from the past 240 hour(s))  Urine culture     Status: None   Collection Time: 05/28/18  4:14 PM  Result Value Ref Range Status   Specimen Description   Final  URINE, CLEAN CATCH Performed at [facility], [address], [place], [place] [address]    Special Requests   Final    NONE Performed at [facility], [address] [place], [place] [address]    Culture   Final    NO GROWTH Performed at [facility], [address] [place], [place] [address]    Report Status 05/29/2018 FINAL  Final      Studies: Dg Chest 2 View  Result Date: 05/28/2018 CLINICAL DATA:  Weakness 6 days with intermittent altered mental status. EXAM: CHEST - 2 VIEW COMPARISON:  11/16/2017 FINDINGS: Lungs are adequately inflated demonstrate airspace consolidation over the left midlung likely left upper lobe pneumonia. No evidence of effusion. Slight worsening moderate cardiomegaly. Remainder of the exam is unchanged IMPRESSION: Mild airspace opacification over the left upper lobe likely pneumonia. Slight worsening moderate cardiomegaly. Electronically Signed   By: Elberta Fortis [name]   On: 05/28/2018 16:10   Ct Head Wo Contrast  Result Date: 05/28/2018 CLINICAL DATA:  Generalized weakness since 05/22/2018. Intermittent altered mental  status. EXAM: CT HEAD WITHOUT CONTRAST TECHNIQUE: Contiguous axial images were obtained from the base of the skull through the vertex without intravenous contrast. COMPARISON:  11/29/2016. FINDINGS: Brain: Diffusely enlarged ventricles and subarachnoid spaces. Patchy white matter low density in both cerebral hemispheres. No intracranial hemorrhage, mass lesion or CT evidence of acute infarction. Vascular: No hyperdense vessel or unexpected calcification. Skull: Bilateral hyperostosis frontalis. Sinuses/Orbits: Status post bilateral cataract extraction. Single residual opacified left ethmoid air cell. Resolved right mastoid air cell opacification. Other: None. IMPRESSION: 1. No acute abnormality. 2. Stable atrophy. 3. Progressive chronic small vessel white matter ischemic changes in both cerebral hemispheres. 4. Improved left ethmoid sinusitis. 5. Resolved right mastoid mucosal thickening and fluid. Electronically Signed   By: Beckie Salts [name]   On: 05/28/2018 15:56   Ct Angio Chest Pe W And/or Wo Contrast  Result Date: 05/28/2018 CLINICAL DATA:  Shortness of breath. EXAM: CT ANGIOGRAPHY CHEST WITH CONTRAST TECHNIQUE: Multidetector CT imaging of the chest was performed using the standard protocol during bolus administration of intravenous contrast. Multiplanar CT image reconstructions and MIPs were obtained to evaluate the vascular anatomy. CONTRAST:  100 mL ISOVUE-370 IOPAMIDOL (ISOVUE-370) INJECTION 76% COMPARISON:  Chest CT angiogram September 11, 2015; chest radiograph May 28, 2018 FINDINGS: Cardiovascular: There is no demonstrable pulmonary embolus. There is no thoracic aortic aneurysm. No dissection is seen in the thoracic aorta. Note that the contrast bolus in the aorta is less than optimal for assessment for potential dissection. Visualized great vessels appear unremarkable except for slight calcification in the left common carotid artery. Note that the left common carotid and right innominate arteries  arise as a common trunk, an anatomic variant. There is no appreciable pericardial effusion or pericardial thickening. There is aortic atherosclerosis. There are foci of coronary artery calcification. Mediastinum/Nodes: There is a subcentimeter nodular opacity in the left lobe of the thyroid. No dominant thyroid mass evident. There is no evident thoracic adenopathy. No esophageal lesions are appreciable. Lungs/Pleura: There is airspace consolidation in the anterior segment left upper lobe consistent with pneumonia. There is atelectatic change in each lower lobe with a small right pleural effusion evident. On axial slice 69 series 11, there is a 5 mm nodular opacity in the lateral segment of the right middle lobe. There is a nodular opacity in the anterior segment of the right upper lobe measuring 1.5 x 1.1 cm. This nodular  opacity is best seen on coronal slice 88 series 7 but is also appreciable on sagittal slice 61 series 8 and axial slice 23 series 11. Upper Abdomen: In the visualized upper abdominal region, there is mild reflux into the inferior vena cava and hepatic veins suggesting increase in right heart pressure. There is atherosclerotic calcification in the aorta and proximal major mesenteric arterial vessels. There is a cyst arising from the lateral upper pole right kidney measuring 5.2 x 5.1 cm. Musculoskeletal: There is degenerative change in the thoracic spine. There are no blastic or lytic bone lesions. There is extensive arthropathy in each shoulder. No evident chest wall lesions. Review of the MIP images confirms the above findings. IMPRESSION: 1. There is a 1.5 x 1.1 cm nodular opacity in the anterior segment of the right upper lobe. Consider one of the following in 3 months for both low-risk and high-risk individuals: (a) repeat chest CT, (b) follow-up PET-CT, or (c) tissue sampling. This recommendation follows the consensus statement: Guidelines for Management of Incidental Pulmonary Nodules  Detected on CT Images: From the Fleischner Society 2017; Radiology 2017; 284:228-243. 2. Anterior segment left upper lobe airspace consolidation consistent with pneumonia. 3. No demonstrable pulmonary embolus. No thoracic aortic aneurysm. No dissection seen. It should be noted that the contrast bolus in the aorta is insufficient to exclude dissection confidently as a differential consideration by radiography. 4. There is aortic atherosclerosis. There are foci of coronary artery calcification. There are foci of great vessel and mesenteric arterial vessel calcification as well. 5.  Small right pleural effusion with bibasilar atelectasis. 6. Subcentimeter left thyroid nodule. Per consensus guidelines, this lesion does not warrant additional imaging surveillance. 7.  No demonstrable thoracic adenopathy. 8. Reflux into the inferior vena cava and hepatic veins may be indicative of a degree of increase in right heart pressure. Aortic Atherosclerosis (ICD10-I70.0). Electronically Signed   By: Bretta Bang III [name]   On: 05/28/2018 19:08    Scheduled Meds: . allopurinol  200 mg Oral Daily  . aspirin EC  81 mg Oral Daily  . calcium-vitamin D  1 tablet Oral Daily  . cholecalciferol  1,000 Units Oral Daily  . dextromethorphan  30 mg Oral BID  . diclofenac sodium  1 application Topical Daily  . enoxaparin (LOVENOX) injection  40 mg Subcutaneous Q24H  . ferrous sulfate  325 mg Oral Daily  . furosemide  20 mg Intravenous Daily  . gabapentin  100 mg Oral QHS  . guaiFENesin  1,200 mg Oral BID  . hydrALAZINE  100 mg Oral TID  . hydroxychloroquine  200 mg Oral BID  . insulin aspart  0-15 Units Subcutaneous TID WC  . insulin aspart  0-5 Units Subcutaneous QHS  . ipratropium-albuterol  3 mL Nebulization Q6H  . labetalol  200 mg Oral BID  . leflunomide  20 mg Oral Daily  . lisinopril  30 mg Oral Daily  . potassium chloride SA  20 mEq Oral Daily  . predniSONE  10 mg Oral Q breakfast  . sodium chloride flush   3 mL Intravenous Q12H  . sodium chloride HYPERTONIC  4 mL Nebulization TID  . triamcinolone cream  1 application Topical BID  . vitamin C  500 mg Oral QODAY    Continuous Infusions: . sodium chloride    . azithromycin    . cefTRIAXone (ROCEPHIN)  IV       LOS: 1 day     Darlin Drop, MD [facility] Pager [phone]  If  7PM-7AM, please contact night-coverage [email or web address] Password [facility] 05/29/2018, 2:51 PM

## 2018-05-29 NOTE — Progress Notes (Signed)
Paged by bedside RN regarding patient's drowsiness, inability to follow commands, and what was seen as a right-sided facial droop.  Vital signs stable during this time.  Blood sugar 97.  Upon entering the room patient somnolent but in no apparent distress.  [name] at bedside and states that she has been this way since approximately 2:00 PM.  Bedside RN states that she was unable to awaken the patient during her assessment and unable to get her to follow commands.  Initially patient did not follow commands and would awaken briefly but quickly falls back asleep. Pupils were equal and reactive.  After multiple rounds of deep stimulation patient finally awakened but was confused and disoriented.  After a few minutes patient became alert oriented x4 and following all commands. Pts NIH was 0.  Code stroke was not called due to the patient awakening and being oriented.  We will still obtain a head CT to rule out any acute findings.  Upon exiting the room the patient was sitting up in bed conversing with daughter and eating a snack.  We will continue to closely monitor and I informed the patient and the patient's daughter to notify staff if there is anything else I can do for them.  Stevie Kern AGPCNP-BC, AGNP-C [facility] Pager [phone]

## 2018-05-29 NOTE — Progress Notes (Signed)
CRITICAL VALUE ALERT  Critical Value:  Troponin 0.06  Date & Time Notied:  05/29/2018 0128  Provider Notified: Clance Boll, NP    Orders Received/Actions taken:

## 2018-05-29 NOTE — Plan of Care (Signed)
Pt is weak and must use a purewick. This is a change from her baseline. She lives alone and has homecare, but can usually ambulate to the BR independently.

## 2018-05-29 NOTE — Progress Notes (Signed)
Pt VSs pt lethargic and arouses to name call, pt cough at interval. Pt breathing deep while sleeping. O2 at 2 liters is 98%.  MD made aware. SRP, RN

## 2018-05-29 NOTE — Progress Notes (Addendum)
Pt drowsy, responds to name call then return to sleep, pt daughter request "do not administer Delsym". SRP, RN

## 2018-05-29 NOTE — Progress Notes (Signed)
  Echocardiogram 2D Echocardiogram has been performed.  Keyontae Huckeby T Rise Traeger 05/29/2018, 10:34 AM

## 2018-05-29 NOTE — Progress Notes (Signed)
Initial assessment on patient finds patient very lethargic. Pupils round, equal, slow to react. Patient can not stay awake long enough to follow commands well. She has weaker grip on right, right sided facial droop, could not understand to flex feet. Unable to hold arms up to test drift. Stevie Kern, NP notified. Will continue to assess. Erick Colace, RN

## 2018-05-29 NOTE — Progress Notes (Addendum)
Pt seen at 2050 for scheduled nebulizer treatment.  Pt was found awake, alert, talking to MD and daughter in room.  HR85, rr16, spo2 99%, breathsounds diminished with some scattered rhonchi.  No wheezes auscultated.  RT protocol assessment done with score of 6.  Rx for duoneb changed to tid per RT protocol.

## 2018-05-29 NOTE — Progress Notes (Signed)
BP 156/100, P-101. C. Bodenheimer contacted. No new orders because Pt has Zestril, Labetolol, and Hydralazine scheduled for 10 am.

## 2018-05-30 DIAGNOSIS — J181 Lobar pneumonia, unspecified organism: Secondary | ICD-10-CM

## 2018-05-30 DIAGNOSIS — R7989 Other specified abnormal findings of blood chemistry: Secondary | ICD-10-CM

## 2018-05-30 DIAGNOSIS — R778 Other specified abnormalities of plasma proteins: Secondary | ICD-10-CM

## 2018-05-30 DIAGNOSIS — I420 Dilated cardiomyopathy: Secondary | ICD-10-CM

## 2018-05-30 DIAGNOSIS — R748 Abnormal levels of other serum enzymes: Secondary | ICD-10-CM

## 2018-05-30 DIAGNOSIS — J189 Pneumonia, unspecified organism: Secondary | ICD-10-CM

## 2018-05-30 DIAGNOSIS — I160 Hypertensive urgency: Secondary | ICD-10-CM

## 2018-05-30 LAB — CBC
HCT: 30.8 % — ABNORMAL LOW (ref 36.0–46.0)
Hemoglobin: 9.8 g/dL — ABNORMAL LOW (ref 12.0–15.0)
MCH: 30.1 pg (ref 26.0–34.0)
MCHC: 31.8 g/dL (ref 30.0–36.0)
MCV: 94.5 fL (ref 78.0–100.0)
Platelets: 370 10*3/uL (ref 150–400)
RBC: 3.26 MIL/uL — ABNORMAL LOW (ref 3.87–5.11)
RDW: 17.7 % — ABNORMAL HIGH (ref 11.5–15.5)
WBC: 11.3 10*3/uL — ABNORMAL HIGH (ref 4.0–10.5)

## 2018-05-30 LAB — BASIC METABOLIC PANEL
Anion gap: 10 (ref 5–15)
BUN: 24 mg/dL — ABNORMAL HIGH (ref 8–23)
CO2: 25 mmol/L (ref 22–32)
Calcium: 8.7 mg/dL — ABNORMAL LOW (ref 8.9–10.3)
Chloride: 108 mmol/L (ref 98–111)
Creatinine, Ser: 1.31 mg/dL — ABNORMAL HIGH (ref 0.44–1.00)
GFR calc Af Amer: 41 mL/min — ABNORMAL LOW (ref >60)
GFR calc non Af Amer: 36 mL/min — ABNORMAL LOW (ref >60)
Glucose, Bld: 94 mg/dL (ref 70–99)
Potassium: 4.1 mmol/L (ref 3.5–5.1)
Sodium: 143 mmol/L (ref 135–145)

## 2018-05-30 LAB — FOLATE RBC
Folate, Hemolysate: 380.6 ng/mL
Folate, RBC: 1269 ng/mL (ref >498)
Hematocrit: 30 % — ABNORMAL LOW (ref 34.0–46.6)

## 2018-05-30 LAB — GLUCOSE, CAPILLARY
Glucose-Capillary: 154 mg/dL — ABNORMAL HIGH (ref 70–99)
Glucose-Capillary: 159 mg/dL — ABNORMAL HIGH (ref 70–99)
Glucose-Capillary: 144 mg/dL — ABNORMAL HIGH (ref 70–99)
Glucose-Capillary: 112 mg/dL — ABNORMAL HIGH (ref 70–99)

## 2018-05-30 LAB — MAGNESIUM: Magnesium: 1.7 mg/dL (ref 1.7–2.4)

## 2018-05-30 MED ORDER — TRAMADOL HCL 50 MG PO TABS
50.0000 mg | ORAL_TABLET | Freq: Four times a day (QID) | ORAL | Status: DC | PRN
  Administered 2018-05-30 – 2018-06-04 (×3): 50 mg via ORAL
  Filled 2018-05-30 (×3): qty 1

## 2018-05-30 MED ORDER — CARVEDILOL 6.25 MG PO TABS
6.2500 mg | ORAL_TABLET | Freq: Two times a day (BID) | ORAL | Status: DC
  Administered 2018-05-30 – 2018-05-31 (×2): 6.25 mg via ORAL
  Filled 2018-05-30 (×2): qty 1

## 2018-05-30 MED ORDER — ISOSORBIDE MONONITRATE ER 30 MG PO TB24
15.0000 mg | ORAL_TABLET | Freq: Every day | ORAL | Status: DC
  Administered 2018-05-30 – 2018-06-02 (×5): 15 mg via ORAL
  Filled 2018-05-30 (×6): qty 1

## 2018-05-30 NOTE — Progress Notes (Signed)
Addend Discussed with rheumatologist at [facility]-he states that since he has been taking care of her and took over her care from Dr. Tollie Eth in [place], patient has not been on any biologicals which are known to cause heart failure-he recommends outpatient follow-up in about 1 week subsequent to discharge family is aware of his number and how to contact him-he does not recommend anything beyond prednisone on discharge

## 2018-05-30 NOTE — Progress Notes (Signed)
Patient awakened by NP, originally confused but re-oriented well. CT of head completed,  Code Stroke called off. Family present, updated at bedside. Will continue to observe.  Erick Colace, RN

## 2018-05-30 NOTE — Progress Notes (Signed)
[facility] PROGRESS NOTE  [name] [name] EPP:[id number] DOB: 06-Jun-1930 DOA: 05/28/2018 PCP: Helane Rima, DO   Narrative: 84 year old female DM TY 2 on insulin Rheumatoid arthritis on methotrexate, prednisone, allopurinol-previously Humira Xeljanz Bactrim  Orencia Prior knee surgeries CKD 2-3 CAD Chronic lower extremity wounds?  PAD seen in the past by vascular surgery right ABI 11/2017 noncompressible right lower extremity arteries CVA-has a tendency to metabolic encephalopathy  I took care of this patient 05/27/2017-had infected rheumatoid nodules DDX pyoderma gangrenosum  Readmitted 05/28/2018-extubation CHF, possible community-acquired pneumonia, overnight became encephalopathic 05/30/2018-work-up was done showing overall negative CT scan of head with chronic microvascular changes consistent with small infarcts/age-related   A & Plan:  Acute metabolic encephalopathy-multiple confounders-AKI noted-also on gabapentin 100 at bedtime, decode own 1 every 4 as needed, tramadol 1-2 every 6 as needed--- patient tells me that she takes her medications in a different fashion at [facility] asked pharmacist to schedule them according to home schedule to avoid confusion  Pneumonia-CT chest 7/20 = 1.5 X1.1 nodular opacity RUL, anterior segment LUL consolidation-speech therapy eval today-risk factors prior CVA, metabolic encephalopathy Speech therapy will assess the patient not emergently and discussed diet-I suspect encephalopathy from multiple sedating agents may have had a role to play  ?  Acute systolic heart failure-EF 20-25%-?  Plaquenil versus methotrexate ---EKG no overt ST-T wave changes-defer to cardiology further work-up--patient echo in 3 to 6 months  Acute kidney injury--Baseline creatinine 05/28/2018 bumped on 7/22--discontinue lisinopril 30, need to carefully consider Lasix IV dosing-currently 20 mg  Rheumatoid arthritis-continue Plaquenil 200 twice daily-Plaquenil can cause  cardiotoxicity and I will defer decision making regarding the same to cardiology with regards to dosing of diuretics ACE inhibitor etc. she will need close follow-up with her rheumatologist-has failed multiple lines of therapy for the same-continue allopurinol without renal dosing  Pain secondary to rheumatoid-cut back tramadol from 2 6 as needed to 50-monitor mentation diclofenac patch    Patient has had 3-4 hospitalizations within the past year that I have seen her-it is unclear what CODE STATUS is all what discussions have been undertaken in the outpatient setting-defer the same to primary care physician  DVT prophylaxis: Lovenox code Status: Full family Communication: Discussed with daughter disposition Plan: Inpatient at this time   Mahala Menghini, MD  [facility] Direct contact: [phone] --Via amion app OR  --[email or web address]; password [email or web address]  7PM-7AM contact night coverage as above 05/30/2018, 7:37 AM  LOS: 2 days   Consultants:  none  Procedures:  none  Antimicrobials:  none  Interval history/Subjective: Awake alert pleasant n nad Not confused-no distress No cough no fever no chills   Objective:  Vitals:  Vitals:   05/30/18 0000 05/30/18 0531  BP: (!) 107/93 135/70  Pulse:  83  Resp:  20  Temp:  98.5 F (36.9 C)  SpO2:  100%    Exam:    I have personally reviewed the following:   Labs:  BUN/creatinine 15/0.85--->24/1.3  Troponin trend 0.06 and flat  BNP on admission 2077  Iron level 20 T sat 7  Globin 9.8-trend between 9 and 10-WBC 11.3 up from 9.7  Imaging studies:  CT scan head 7/21 microvascular changes-  CT chest as above  Medical tests:  None  Test discussed with performing physician:  None  Decision to obtain old records:  Yes  Review and summation of old records:  Extensively reviewed and summarized  Scheduled Meds: . allopurinol  200 mg Oral Daily  . aspirin EC  81 mg Oral  Daily  . calcium-vitamin D  1 tablet  Oral Daily  . cholecalciferol  1,000 Units Oral Daily  . diclofenac sodium  1 application Topical Daily  . enoxaparin (LOVENOX) injection  40 mg Subcutaneous Q24H  . ferrous sulfate  325 mg Oral BID WC  . furosemide  20 mg Intravenous Daily  . gabapentin  100 mg Oral QHS  . guaiFENesin  1,200 mg Oral BID  . hydrALAZINE  100 mg Oral TID  . hydroxychloroquine  200 mg Oral BID  . insulin aspart  0-5 Units Subcutaneous QHS  . ipratropium-albuterol  3 mL Nebulization TID  . labetalol  200 mg Oral BID  . lisinopril  30 mg Oral Daily  . potassium chloride SA  20 mEq Oral Daily  . predniSONE  10 mg Oral Q breakfast  . sodium chloride flush  3 mL Intravenous Q12H  . sodium chloride HYPERTONIC  4 mL Nebulization TID  . triamcinolone cream  1 application Topical BID  . vitamin C  500 mg Oral QODAY   Continuous Infusions: . sodium chloride    . azithromycin Stopped (05/29/18 2321)  . cefTRIAXone (ROCEPHIN)  IV Stopped (05/29/18 1708)    Active Problems:   Congestive heart failure (CHF) (HCC)   LOS: 2 days

## 2018-05-30 NOTE — Consult Note (Signed)
Cardiology Consultation:   Patient ID: [name] CUNANAN; [id number]; 1930/07/08   Admit date: 05/28/2018 Date of Consult: 05/30/2018  Primary Care Provider: Briscoe Deutscher, DO Primary Cardiologist: Skeet Latch, MD  Primary Electrophysiologist:  N/A   Patient Profile:   [name] [name] is a 84 y/o female, with h/o HTN, Rheumatoid Arthritis, diabetes type 2, CAD s/p remote MI, CVA, PVD and chronic diastolic heart failure, admitted for acute CHF and CAP with echo showing new systolic HF, for which cardiology has been consulted, at the request of Dr. Verlon Au, Internal Medicine.   History of Present Illness:   [name] [name] is a 84 y/o female, with h/o HTN, Rheumatoid Arthritis, diabetes type 2, CAD s/p MI, CVA, PVD and chronic diastolic heart failure, admitted for acute CHF and CAP with echo showing new systolic HF, for which cardiology has been consulted, at the request of Dr. Verlon Au, Internal Medicine.   Pt has been seen remotely by Dr. Debara Pickett in 2014 but most recently by Dr. Oval Linsey, but lost to f/u. Not seen in clinic since 11/06/15. Echo in 2015 showed normal LVEF at 60-65% and G1DD, severe LVH but normal wall motion. There is a reported history of MI in the 1970s.  Pt was admitted in March by [facility] after presenting with fevers with no infectious process identified. Sepsis ruled out. Pt felt to have likely febrile viral syndrome. She had clinical improvement w/o antibiotics.   She presented to the ED last week, 7/16, with complaints of weakness. [name] work-up was essentially unremarkable.  Laboratory studies and urinalysis showed no obvious cause.  Her EKG showed sinus rhythm. She did not require admission at that time and was discharged from the ED back home.   She presented back, 4 days later, on 7/20 with increased weakness, some mild vague chest discomfort, dyspnea, elevated BP and confusion. No fever or chills. BP in the ED was in the 366Q systolic and low 947M diastolic. Head CT  showed no acute intercranial findings. CXR showed worsening moderate cardiomegaly, in comparison to prior study, as well as mild airspace opacification over the LUL, likely PNA. Similar findings were noted on chest CT. No PE or dissection. No thoracic aortic aneurysm identified. Labs notable for elevated BNP at 2,077. Mild troponin elevation with flat trend, 0.06>>0.05>>0.06. TSH WNL. CBC showed chronic but worsening anemia with Hgb down to 9.8 (baseline ~11). FOBT pending. Anemia w/u pending. BMP with normal SCr at 0.85. Mild hypokalemia at 3.3. UA negative. Pt admitted by IM and placed on antibiotics for PNA and diuretics for acute CHF. 2D echo obtained 05/29/18 showed new reduction in EF, now at 20-25% (previously 60-65% in 2015). No RWMA noted. Mild AI,  mild to moderate MR and moderate TR present. There was a small patent foramen ovale. There was a left-to-right shunt. Systolic PA pressure moderately increased at 47 mm Hg.   Pt currently on 20 mg of IV Lasix BID, previously on 20 mg oral Lasix every other day at home. Weight down from 190 to 189 lb. UOP is not being reordered. F/u BMP suggest AKI, with increase in SCr from 0.83>>1.31. BUN up from 17>>24. She is not on an ACE/ARB. BP overall improved, now at 135/70. Cardiology consulted for recommendations.   On interview, pt notes that she has had recent unexplained weakness for several weeks. Gives out easily if she tries to exert herself. Mild occasional dyspnea and mild LEE, but no recent orthopnea or PND. Has had occasional vague CP but no pressure or tightness.  No recent illnesses. No fever, chills, h/v/d. Lives at home but has home health services but family contemplating nursing facility given increasing care needs. Pt reports full med compliance at home with lasix. Low sodium diet. Does not ambulate much due to weakness.     Past Medical History:  Diagnosis Date  . Allergic rhinitis due to pollen 04/27/2007  . Arthritis    "in q joint"  (08/07/2013)  . Coronary atherosclerosis of native coronary artery   . Cough   . Disorder of bone and cartilage, unspecified   . Edema 05/03/2013  . Exertional shortness of breath    "sometimes" (08/07/2013)  . First degree atrioventricular block   . Gout, unspecified 04/19/2013  . Hyperlipidemia 04/27/2007  . Hypertension   . Muscle spasm   . Muscle weakness (generalized)   . Myocardial infarction ([place]) ~ 1970  . Onychia and paronychia of toe   . Osteoarthrosis, unspecified whether generalized or localized, unspecified site 04/27/2007  . Other fall   . Other malaise and fatigue   . Prepatellar bursitis   . Seizures ([place])   . Stroke ([place]) 01/17/2014  . TIA (transient ischemic attack)    "a few one summer" (08/07/2013)  . Type II diabetes mellitus ([place])    "fasting 90-110s" (08/07/2013)  . Unspecified essential hypertension 08/08/2013  . Unspecified vitamin D deficiency     Past Surgical History:  Procedure Laterality Date  . ABDOMINAL HYSTERECTOMY  04/1980  . APPENDECTOMY  1960  . CARDIAC CATHETERIZATION  2003  . CATARACT EXTRACTION W/ INTRAOCULAR LENS  IMPLANT, BILATERAL Bilateral ~ 2012  . JOINT REPLACEMENT    . REPLACEMENT TOTAL KNEE Right 09/2005  . SHOULDER OPEN ROTATOR CUFF REPAIR Right 07/1999  . TONSILLECTOMY  08/1974  . TOTAL KNEE ARTHROPLASTY Left 08/07/2013   Procedure: TOTAL KNEE ARTHROPLASTY- LEFT;  Surgeon: Vickey Huger, MD;  Location: [facility];  Service: Orthopedics;  Laterality: Left;  . TRANSTHORACIC ECHOCARDIOGRAM  2003   EF 55-65%; mild concentric LVH     Home Medications:  Prior to Admission medications   Medication Sig Start Date End Date Taking? Authorizing Provider  albuterol (PROVENTIL HFA;VENTOLIN HFA) 108 (90 Base) MCG/ACT inhaler Inhale 1 puff into the lungs every 6 (six) hours as needed for wheezing or shortness of breath.   Yes [provider]  allopurinol (ZYLOPRIM) 100 MG tablet Take 200 mg by mouth daily.   Yes [provider]    aspirin EC 81 MG tablet Take 81 mg by mouth daily.   Yes [provider]  calcium-vitamin D (OSCAL WITH D) 500-200 MG-UNIT per tablet Take 1 tablet by mouth daily.    Yes [provider]  CHLOROPHYLL PO Take 5 drops by mouth See admin instructions. MIX INTO 6 OUNCES OF JUICE AND DRINK ONCE A DAY   Yes [provider]  cholecalciferol (VITAMIN D) 1000 units tablet Take 1,000 Units by mouth daily.   Yes [provider]  diclofenac sodium (VOLTAREN) 1 % GEL Apply 1 application topically daily. 02/22/18  Yes Briscoe Deutscher, DO  ferrous sulfate 325 (65 FE) MG tablet Take 325 mg by mouth daily.    Yes [provider]  furosemide (LASIX) 20 MG tablet Take 1 tablet (20 mg total) by mouth every other day. 05/17/18  Yes Briscoe Deutscher, DO  gabapentin (NEURONTIN) 100 MG capsule Take 1 capsule (100 mg total) by mouth at bedtime. 05/19/18  Yes Briscoe Deutscher, DO  hydrALAZINE (APRESOLINE) 100 MG tablet TAKE 1 TABLET BY  MOUTH THREE TIMES DAILY 04/05/18  Yes Briscoe Deutscher, DO  hydroxychloroquine (PLAQUENIL) 200 MG tablet Take 200 mg by mouth 2 (two) times daily.   Yes [provider]  hydroxypropyl methylcellulose / hypromellose (ISOPTO TEARS / GONIOVISC) 2.5 % ophthalmic solution Place 1 drop into both eyes 4 (four) times daily as needed for dry eyes. 07/07/17  Yes Briscoe Deutscher, DO  labetalol (NORMODYNE) 100 MG tablet TAKE 2 TABLETS BY MOUTH TWICE A DAY 04/06/18  Yes Briscoe Deutscher, DO  leflunomide (ARAVA) 20 MG tablet Take 20 mg by mouth daily.  05/20/18  Yes [provider]  lisinopril (PRINIVIL,ZESTRIL) 30 MG tablet Take 30 mg by mouth daily.   Yes [provider]  metFORMIN (GLUCOPHAGE) 500 MG tablet Take 1 tablet (500 mg total) by mouth daily with breakfast. 01/28/18  Yes Briscoe Deutscher, DO  Multiple Vitamins-Minerals (ALIVE ONCE DAILY WOMENS PO) Take 15 mLs by mouth every other day.    Yes [provider]  potassium chloride 20 MEQ  TBCR Take 20 mEq by mouth daily. 05/17/18  Yes Briscoe Deutscher, DO  predniSONE (DELTASONE) 10 MG tablet Take 1 tablet (10 mg total) by mouth daily with breakfast. 03/25/18  Yes Briscoe Deutscher, DO  traMADol (ULTRAM) 50 MG tablet TAKE 1 TO 2 TABLETS BY MOUTH EVERY 6 HOURS AS NEEDED FOR PAIN Patient taking differently: Take 50-100 mg by mouth every 6 (six) hours as needed for moderate pain.  05/18/18  Yes Briscoe Deutscher, DO  triamcinolone cream (KENALOG) 0.1 % Apply 1 application topically 2 (two) times daily.   Yes [provider]  vitamin C (ASCORBIC ACID) 500 MG tablet Take 500 mg by mouth every other day.    Yes [provider]  allopurinol (ZYLOPRIM) 300 MG tablet Take 0.5 tablets (150 mg total) by mouth every evening. 11/29/17 02/02/18  Elodia Florence., MD  blood glucose meter kit and supplies KIT Dispense based on patient and insurance preference. Use up to four times daily as directed. (FOR ICD-9 250.00, 250.01). 07/30/17   Briscoe Deutscher, DO  glucose blood test strip Test QID dx code E11.9 12/24/17   Briscoe Deutscher, DO  lisinopril (PRINIVIL,ZESTRIL) 2.5 MG tablet Take 1 tablet (2.5 mg total) by mouth daily. Patient not taking: Reported on 05/24/2018 02/04/18   Donne Presli, MD  methotrexate (RHEUMATREX) 2.5 MG tablet TAKE 4 TABLETS(10 MG) BY MOUTH 1 TIME A WEEK Patient not taking: Reported on 05/24/2018 03/21/18   Briscoe Deutscher, DO  Olopatadine HCl 0.2 % SOLN Apply 1 drop to eye daily. Patient not taking: Reported on 05/24/2018 02/22/18   Briscoe Deutscher, DO  oxycodone (OXY-IR) 5 MG capsule Take 1 capsule (5 mg total) by mouth every 4 (four) hours as needed for pain. Patient not taking: Reported on 05/24/2018 12/03/17   Briscoe Deutscher, DO  oxyCODONE-acetaminophen (PERCOCET/ROXICET) 5-325 MG tablet Take 1 tablet by mouth every 4 (four) hours as needed for severe pain. Patient not taking: Reported on 05/24/2018 03/14/18   Briscoe Deutscher, DO  predniSONE (DELTASONE) 10 MG tablet Take 1  tablet (10 mg total) by mouth daily with breakfast. Patient not taking: Reported on 05/24/2018 02/22/18   Briscoe Deutscher, DO  ranitidine (ZANTAC) 150 MG tablet TAKE 1 TABLET(150 MG) BY MOUTH AT BEDTIME Patient not taking: Reported on 05/28/2018 04/11/18   Briscoe Deutscher, DO    Inpatient Medications: Scheduled Meds: . allopurinol  200 mg Oral Daily  . aspirin EC  81 mg Oral Daily  . calcium-vitamin D  1 tablet Oral Daily  . cholecalciferol  1,000 Units Oral Daily  . diclofenac sodium  1 application Topical Daily  . enoxaparin (LOVENOX) injection  40 mg Subcutaneous Q24H  . ferrous sulfate  325 mg Oral BID WC  . furosemide  20 mg Intravenous Daily  . gabapentin  100 mg Oral QHS  . guaiFENesin  1,200 mg Oral BID  . hydrALAZINE  100 mg Oral TID  . hydroxychloroquine  200 mg Oral BID  . insulin aspart  0-5 Units Subcutaneous QHS  . ipratropium-albuterol  3 mL Nebulization TID  . labetalol  200 mg Oral BID  . lisinopril  30 mg Oral Daily  . potassium chloride SA  20 mEq Oral Daily  . predniSONE  10 mg Oral Q breakfast  . sodium chloride flush  3 mL Intravenous Q12H  . sodium chloride HYPERTONIC  4 mL Nebulization TID  . triamcinolone cream  1 application Topical BID  . vitamin C  500 mg Oral QODAY   Continuous Infusions: . sodium chloride    . azithromycin Stopped (05/29/18 2321)  . cefTRIAXone (ROCEPHIN)  IV Stopped (05/29/18 1708)   PRN Meds: sodium chloride, acetaminophen, albuterol, ondansetron (ZOFRAN) IV, polyvinyl alcohol, sodium chloride flush, traMADol  Allergies:    Allergies  Allergen Reactions  . Clonidine Derivatives Swelling    [name]  . Fish Allergy Diarrhea, Swelling and Other (See Comments)    Turns skin "black," but can tolerate white fish Salmon- Diarrhea  . Shellfish Allergy Hives  . Doxycycline Rash  . Indomethacin     Reaction not recalled by the patient  . Lyrica [Pregabalin]      Hallucinations   . Methyldopa     Aldomet (for hypertension): Reaction not recalled by the patient  . Orange Fruit [Citrus] Other (See Comments)    Indigestion/heartburn  . Cetirizine Hcl Itching and Rash  . Codeine Itching  . Levaquin [Levofloxacin In D5w] Rash  . Tomato Rash    Social History:   Social History   Socioeconomic History  . Marital status: Widowed    Spouse name: Not on file  . Number of children: Not on file  . Years of education: Not on file  . Highest education level: Not on file  Occupational History  . Not on file  Social Needs  . Financial resource strain: Not on file  . Food insecurity:    Worry: Not on file    Inability: Not on file  . Transportation needs:    Medical: Not on file    Non-medical: Not on file  Tobacco Use  . Smoking status: Never Smoker  . Smokeless tobacco: Never Used  Substance and Sexual Activity  . Alcohol use: No    Alcohol/week: 0.0 oz  . Drug use: No  . Sexual activity: Never  Lifestyle  . Physical activity:    Days per week: Not on file    Minutes per session: Not on file  . Stress: Not on file  Relationships  . Social connections:    Talks on phone: Not on file    Gets together: Not on file    Attends religious service: Not on file    Active member of club or organization: Not on file    Attends meetings of clubs or organizations: Not on file    Relationship status: Not on file  . Intimate partner violence:    Fear of current or ex partner: Not  on file    Emotionally abused: Not on file    Physically abused: Not on file    Forced sexual activity: Not on file  Other Topics Concern  . Not on file  Social History Narrative   Widowed   Walks with cane    Family History:   Family History  Problem Relation Age of Onset  . Heart attack Father      ROS:  Please see the history of present illness.   All other ROS reviewed and negative.     Physical Exam/Data:   Vitals:   05/29/18 2051 05/29/18 2149  05/30/18 0000 05/30/18 0531  BP:  (!) 192/103 (!) 107/93 135/70  Pulse:  81  83  Resp:  18  20  Temp:  98.1 F (36.7 C)  98.5 F (36.9 C)  TempSrc:  Oral  Oral  SpO2: 99% 100%  100%  Weight:    189 lb 9.5 oz (86 kg)  Height:        Intake/Output Summary (Last 24 hours) at 05/30/2018 0745 Last data filed at 05/29/2018 2321 Gross per 24 hour  Intake 470 ml  Output -  Net 470 ml   Filed Weights   05/28/18 2346 05/29/18 0455 05/30/18 0531  Weight: 190 lb 11.2 oz (86.5 kg) 190 lb 11.2 oz (86.5 kg) 189 lb 9.5 oz (86 kg)   Body mass index is 32.54 kg/m.  General:  Well nourished, well developed, in no acute distress, moderately obese  HEENT: normal Lymph: no adenopathy Neck: no JVD Endocrine:  No thryomegaly Vascular: No carotid bruits; FA pulses 2+ bilaterally without bruits  Cardiac:  normal S1, S2; RRR; no murmur  Lungs:  clear to auscultation bilaterally, no wheezing, rhonchi or rales  Abd: soft, nontender, no hepatomegaly  Ext: no edema Musculoskeletal:  No deformities, BUE and BLE strength normal and equal Skin: warm and dry  Neuro:  CNs 2-12 intact, no focal abnormalities noted Psych:  Normal affect   EKG:  The EKG was personally reviewed and demonstrates:  NSR  Telemetry:  Telemetry was personally reviewed and demonstrates:  NSR, upper 70s  Relevant CV Studies: 2D echo in 2015 Study Conclusions  - Left ventricle: The cavity size was normal. Wall thickness was increased in a pattern of moderate to severe LVH. There was moderate focal basal hypertrophy of the septum. Systolic function was normal. The estimated ejection fraction was in the range of 60% to 65%. Wall motion was normal; there were no regional wall motion abnormalities. Doppler parameters are consistent with abnormal left ventricular relaxation (grade 1 diastolic dysfunction). - Left atrium: The atrium was moderately dilated. - Pulmonary arteries: Systolic pressure was  mildly increased.  2D Echo 05/29/18 Study Conclusions  - Left ventricle: The cavity size was normal. Wall thickness was   increased in a pattern of moderate LVH. Systolic function was   severely reduced. The estimated ejection fraction was in the   range of 20% to 25%. Although no diagnostic regional wall motion   abnormality was identified, this possibility cannot be completely   excluded on the basis of this study. - Aortic valve: Mildly calcified annulus. Trileaflet; normal   thickness, moderately calcified leaflets. There was mild   regurgitation. - Mitral valve: Mildly calcified annulus. Mildly calcified leaflets   . There was mild to moderate regurgitation. - Left atrium: The atrium was moderately to severely dilated. - Right ventricle: The cavity size was mildly dilated. Wall   thickness was normal. Systolic function  was mildly reduced. - Right atrium: The atrium was moderately dilated. - Atrial septum: There was a small patent foramen ovale. There was   a left-to-right shunt. - Tricuspid valve: There was moderate regurgitation. - Pulmonary arteries: Systolic pressure was moderately increased.   PA peak pressure: 47 mm Hg (S). - Pericardium, extracardiac: A trivial pericardial effusion was   identified.  Laboratory Data:  Chemistry Recent Labs  Lab 05/28/18 1613 05/29/18 0707 05/30/18 0449  NA 141 141 143  K 4.1 3.3* 4.1  CL 108 105 108  CO2 24 25 25   GLUCOSE 154* 108* 94  BUN 17 15 24*  CREATININE 0.83 0.85 1.31*  CALCIUM 9.1 9.0 8.7*  GFRNONAA >60 >60 36*  GFRAA >60 >60 41*  ANIONGAP 9 11 10     Recent Labs  Lab 05/28/18 1613  PROT 6.8  ALBUMIN 3.3*  AST 23  ALT 20  ALKPHOS 81  BILITOT 0.6   Hematology Recent Labs  Lab 05/24/18 0350 05/28/18 1900 05/30/18 0449  WBC 9.6 9.7 11.3*  RBC 3.50* 3.25* 3.26*  HGB 10.7* 9.8* 9.8*  HCT 33.4* 30.1* 30.8*  MCV 95.4 92.6 94.5  MCH 30.6 30.2 30.1  MCHC 32.0 32.6 31.8  RDW 18.3* 17.4* 17.7*  PLT  340 368 370   Cardiac Enzymes Recent Labs  Lab 05/29/18 0030 05/29/18 0707 05/29/18 1128  TROPONINI 0.06* 0.05* 0.06*    Recent Labs  Lab 05/28/18 1622  TROPIPOC 0.04    BNP Recent Labs  Lab 05/28/18 1614  BNP 2,077.6*    DDimer No results for input(s): DDIMER in the last 168 hours.  Radiology/Studies:  Dg Chest 2 View  Result Date: 05/28/2018 CLINICAL DATA:  Weakness 6 days with intermittent altered mental status. EXAM: CHEST - 2 VIEW COMPARISON:  11/16/2017 FINDINGS: Lungs are adequately inflated demonstrate airspace consolidation over the left midlung likely left upper lobe pneumonia. No evidence of effusion. Slight worsening moderate cardiomegaly. Remainder of the exam is unchanged IMPRESSION: Mild airspace opacification over the left upper lobe likely pneumonia. Slight worsening moderate cardiomegaly. Electronically Signed   By: Marin Olp [name]   On: 05/28/2018 16:10   Ct Head Wo Contrast  Result Date: 05/29/2018 CLINICAL DATA:  Drowsiness with RIGHT facial droop. EXAM: CT HEAD WITHOUT CONTRAST TECHNIQUE: Contiguous axial images were obtained from the base of the skull through the vertex without intravenous contrast. COMPARISON:  05/28/2018. FINDINGS: Brain: No evidence for acute infarction, hemorrhage, mass lesion, hydrocephalus, or extra-axial fluid. Generalized atrophy. Moderate small vessel disease. Vascular: Calcification of the cavernous internal carotid arteries consistent with cerebrovascular atherosclerotic disease. No signs of intracranial large vessel occlusion. Skull: Calvarium intact. Sinuses/Orbits: Clear sinuses.  No orbital findings. Other: No mastoid fluid. IMPRESSION: Atrophy and white matter disease, stable in appearance from 05/28/2018. No acute intracranial findings. Electronically Signed   By: Staci Righter [name]   On: 05/29/2018 21:44   Ct Head Wo Contrast  Result Date: 05/28/2018 CLINICAL DATA:  Generalized weakness since 05/22/2018. Intermittent  altered mental status. EXAM: CT HEAD WITHOUT CONTRAST TECHNIQUE: Contiguous axial images were obtained from the base of the skull through the vertex without intravenous contrast. COMPARISON:  11/29/2016. FINDINGS: Brain: Diffusely enlarged ventricles and subarachnoid spaces. Patchy white matter low density in both cerebral hemispheres. No intracranial hemorrhage, mass lesion or CT evidence of acute infarction. Vascular: No hyperdense vessel or unexpected calcification. Skull: Bilateral hyperostosis frontalis. Sinuses/Orbits: Status post bilateral cataract extraction. Single residual opacified left ethmoid air cell. Resolved right mastoid air cell opacification.  Other: None. IMPRESSION: 1. No acute abnormality. 2. Stable atrophy. 3. Progressive chronic small vessel white matter ischemic changes in both cerebral hemispheres. 4. Improved left ethmoid sinusitis. 5. Resolved right mastoid mucosal thickening and fluid. Electronically Signed   By: Claudie Revering [name]   On: 05/28/2018 15:56   Ct Angio Chest Pe W And/or Wo Contrast  Result Date: 05/28/2018 CLINICAL DATA:  Shortness of breath. EXAM: CT ANGIOGRAPHY CHEST WITH CONTRAST TECHNIQUE: Multidetector CT imaging of the chest was performed using the standard protocol during bolus administration of intravenous contrast. Multiplanar CT image reconstructions and MIPs were obtained to evaluate the vascular anatomy. CONTRAST:  100 mL ISOVUE-370 IOPAMIDOL (ISOVUE-370) INJECTION 76% COMPARISON:  Chest CT angiogram September 11, 2015; chest radiograph May 28, 2018 FINDINGS: Cardiovascular: There is no demonstrable pulmonary embolus. There is no thoracic aortic aneurysm. No dissection is seen in the thoracic aorta. Note that the contrast bolus in the aorta is less than optimal for assessment for potential dissection. Visualized great vessels appear unremarkable except for slight calcification in the left common carotid artery. Note that the left common carotid and right  innominate arteries arise as a common trunk, an anatomic variant. There is no appreciable pericardial effusion or pericardial thickening. There is aortic atherosclerosis. There are foci of coronary artery calcification. Mediastinum/Nodes: There is a subcentimeter nodular opacity in the left lobe of the thyroid. No dominant thyroid mass evident. There is no evident thoracic adenopathy. No esophageal lesions are appreciable. Lungs/Pleura: There is airspace consolidation in the anterior segment left upper lobe consistent with pneumonia. There is atelectatic change in each lower lobe with a small right pleural effusion evident. On axial slice 69 series 11, there is a 5 mm nodular opacity in the lateral segment of the right middle lobe. There is a nodular opacity in the anterior segment of the right upper lobe measuring 1.5 x 1.1 cm. This nodular opacity is best seen on coronal slice 88 series 7 but is also appreciable on sagittal slice 61 series 8 and axial slice 23 series 11. Upper Abdomen: In the visualized upper abdominal region, there is mild reflux into the inferior vena cava and hepatic veins suggesting increase in right heart pressure. There is atherosclerotic calcification in the aorta and proximal major mesenteric arterial vessels. There is a cyst arising from the lateral upper pole right kidney measuring 5.2 x 5.1 cm. Musculoskeletal: There is degenerative change in the thoracic spine. There are no blastic or lytic bone lesions. There is extensive arthropathy in each shoulder. No evident chest wall lesions. Review of the MIP images confirms the above findings. IMPRESSION: 1. There is a 1.5 x 1.1 cm nodular opacity in the anterior segment of the right upper lobe. Consider one of the following in 3 months for both low-risk and high-risk individuals: (a) repeat chest CT, (b) follow-up PET-CT, or (c) tissue sampling. This recommendation follows the consensus statement: Guidelines for Management of Incidental  Pulmonary Nodules Detected on CT Images: From the Fleischner Society 2017; Radiology 2017; 284:228-243. 2. Anterior segment left upper lobe airspace consolidation consistent with pneumonia. 3. No demonstrable pulmonary embolus. No thoracic aortic aneurysm. No dissection seen. It should be noted that the contrast bolus in the aorta is insufficient to exclude dissection confidently as a differential consideration by radiography. 4. There is aortic atherosclerosis. There are foci of coronary artery calcification. There are foci of great vessel and mesenteric arterial vessel calcification as well. 5.  Small right pleural effusion with bibasilar atelectasis. 6. Subcentimeter left  thyroid nodule. Per consensus guidelines, this lesion does not warrant additional imaging surveillance. 7.  No demonstrable thoracic adenopathy. 8. Reflux into the inferior vena cava and hepatic veins may be indicative of a degree of increase in right heart pressure. Aortic Atherosclerosis (ICD10-I70.0). Electronically Signed   By: Lowella Grip III [name]   On: 05/28/2018 19:08    Assessment and Plan:   [name] [name] is a 84 y/o female, with h/o HTN, Rheumatoid Arthritis, diabetes type 2, CAD s/p remote MI, CVA, PVD and chronic diastolic heart failure, admitted for acute CHF and CAP with echo showing new systolic HF (EF 03-40%, previously 60-65% in 2015), for which cardiology has been consulted, at the request of Dr. Verlon Au, Internal Medicine.   1. Acute combined systolic and diastolic CHF/ New Cardiomyopathy: admit BNP 2,077. Echo showed reduced EF at 20-25%. No RWMA noted. Pt does not appear grossly volume overloaded on exam and she has had significant spike in SCr/BUN with low dose IV  lasix. ? RHC to help guide diuresis. Will order strict I/Os. Avoid initiation of ACE/ARB for now given AKI. She is currently on labetalol. Will need switch to either coreg vs metoprolol succinate. She is currently CP free but has had recent chest  discomfort. Troponin flat, low level, likely 2/2 demand ischemia and not ACS. She is 84 y/o with multiple co morbidities. Will defer ischemic w/u to MD.   2. CAP:  Both CXR and chest CT confirmed LUL PNA. On antibiotics per IM, currently azithromycin and ceftriaxone. Currently afebrile. Blood cultures pending.   3. AKI: bump in Scr/BUN from 0.83/17 to 1.31/24 w/ IV lasix. Will continue to monitor. Avoid nephrotoxic agents.   4. Elevated Troponin: flat low level trend, not c/w ACS. Suspect demand ischemia from acute CHF, HTN and PNA.   5. HTN: significantly improved, down from 180s/100s on admit to 135/70 today. Continue to monitor.   6. RA: management per IM.   7. Anemia: CBC showed chronic but worsening anemia with Hgb down to 9.8 (baseline ~11). FOBT pending. Anemia w/u pending.   8. PFO: small PFO noted on echo with L>R shunt.   For questions or updates, please contact [facility] Please consult [email or web address] for contact info under Cardiology/STEMI.   Signed, Lyda Jester, PA-C  05/30/2018 7:45 AM

## 2018-05-31 ENCOUNTER — Encounter (HOSPITAL_COMMUNITY): Payer: Self-pay

## 2018-05-31 DIAGNOSIS — Z515 Encounter for palliative care: Secondary | ICD-10-CM

## 2018-05-31 LAB — BASIC METABOLIC PANEL
Anion gap: 8 (ref 5–15)
BUN: 26 mg/dL — ABNORMAL HIGH (ref 8–23)
CO2: 26 mmol/L (ref 22–32)
Calcium: 8.8 mg/dL — ABNORMAL LOW (ref 8.9–10.3)
Chloride: 109 mmol/L (ref 98–111)
Creatinine, Ser: 1.1 mg/dL — ABNORMAL HIGH (ref 0.44–1.00)
GFR calc Af Amer: 51 mL/min — ABNORMAL LOW (ref >60)
GFR calc non Af Amer: 44 mL/min — ABNORMAL LOW (ref >60)
Glucose, Bld: 118 mg/dL — ABNORMAL HIGH (ref 70–99)
Potassium: 3.6 mmol/L (ref 3.5–5.1)
Sodium: 143 mmol/L (ref 135–145)

## 2018-05-31 LAB — GLUCOSE, CAPILLARY
Glucose-Capillary: 113 mg/dL — ABNORMAL HIGH (ref 70–99)
Glucose-Capillary: 174 mg/dL — ABNORMAL HIGH (ref 70–99)
Glucose-Capillary: 159 mg/dL — ABNORMAL HIGH (ref 70–99)
Glucose-Capillary: 96 mg/dL (ref 70–99)

## 2018-05-31 MED ORDER — CARVEDILOL 12.5 MG PO TABS
12.5000 mg | ORAL_TABLET | Freq: Two times a day (BID) | ORAL | Status: DC
  Administered 2018-05-31 – 2018-06-03 (×6): 12.5 mg via ORAL
  Filled 2018-05-31 (×6): qty 1

## 2018-05-31 MED ORDER — SODIUM CHLORIDE 0.9 % WEIGHT BASED INFUSION
1.0000 mL/kg/h | INTRAVENOUS | Status: DC
  Administered 2018-06-01: 1 mL/kg/h via INTRAVENOUS

## 2018-05-31 MED ORDER — SODIUM CHLORIDE 0.9% FLUSH: 3.0000 mL | Freq: Two times a day (BID) | INTRAVENOUS | Status: DC

## 2018-05-31 MED ORDER — SODIUM CHLORIDE 0.9 % IV SOLN: 250.0000 mL | INTRAVENOUS | Status: DC | PRN

## 2018-05-31 MED ORDER — ASPIRIN 81 MG PO CHEW
81.0000 mg | CHEWABLE_TABLET | ORAL | Status: AC
  Administered 2018-06-01: 81 mg via ORAL
  Filled 2018-05-31: qty 1

## 2018-05-31 MED ORDER — SODIUM CHLORIDE 0.9% FLUSH: 3.0000 mL | INTRAVENOUS | Status: DC | PRN

## 2018-05-31 MED ORDER — GUAIFENESIN ER 600 MG PO TB12
1200.0000 mg | ORAL_TABLET | Freq: Two times a day (BID) | ORAL | Status: DC
  Administered 2018-05-31 – 2018-06-04 (×7): 1200 mg via ORAL
  Filled 2018-05-31 (×7): qty 2

## 2018-05-31 MED ORDER — SODIUM CHLORIDE 0.9 % WEIGHT BASED INFUSION
3.0000 mL/kg/h | INTRAVENOUS | Status: DC
  Administered 2018-06-01: 3 mL/kg/h via INTRAVENOUS

## 2018-05-31 NOTE — Evaluation (Signed)
Physical Therapy Evaluation Patient Details Name: [name] [name] MRN: [id number] DOB: Sep 08, 1930 Today's Date: 05/31/2018   History of Present Illness  84 yo female admitted with CHF. Hx of RA, OA, gout, CAD, CVA, DM.   Clinical Impression  On eval, pt required Min assist for mobility. She walked ~50 feet with a rollator with some encouragement. Pt presents with general weakness, decreased activity tolerance, and impaired gait and balance. No family present during session. Recommend ST rehab at SNF if pt/family are agreeable.     Follow Up Recommendations SNF    Equipment Recommendations  None recommended by PT    Recommendations for Other Services       Precautions / Restrictions Precautions Precautions: Fall Restrictions Weight Bearing Restrictions: No      Mobility  Bed Mobility Overal bed mobility: Needs Assistance Bed Mobility: Supine to Sit;Sit to Supine     Supine to sit: Min assist;HOB elevated Sit to supine: Min assist;HOB elevated   General bed mobility comments: Assist for trunk and LEs. Increased time.   Transfers Overall transfer level: Needs assistance Equipment used: 4-wheeled walker Transfers: Sit to/from Stand Sit to Stand: Min assist         General transfer comment: Assist to rise, stabilize, control descent. VCS safety, technique, hand placement  Ambulation/Gait Ambulation/Gait assistance: Min assist Gait Distance (Feet): 50 Feet Assistive device: 4-wheeled walker Gait Pattern/deviations: Step-through pattern;Decreased stride length     General Gait Details: Assist to stabilize pt throughout distance. Cues for safety. Pt fatigues fairly easily.   Stairs            Wheelchair Mobility    Modified Rankin (Stroke Patients Only)       Balance Overall balance assessment: Needs assistance         Standing balance support: Bilateral upper extremity supported Standing balance-Leahy Scale: Poor                                Pertinent Vitals/Pain Pain Assessment: No/denies pain    Home Living Family/patient expects to be discharged to:: Private residence Living Arrangements: Alone Available Help at Discharge: Personal care attendant;Family;Available PRN/intermittently(aide 7 days/wk (9-1))   Home Access: Level entry     Home Layout: One level Home Equipment: Hand held shower head;Walker - 4 wheels;Walker - 2 wheels;Cane - single point;Tub bench;Wheelchair - manual;Grab bars - toilet      Prior Function Level of Independence: Independent with assistive device(s)         Comments: Amb with rollator, Aide assists with bath, getting dressed, and laundry     Hand Dominance        Extremity/Trunk Assessment   Upper Extremity Assessment Upper Extremity Assessment: Generalized weakness    Lower Extremity Assessment Lower Extremity Assessment: Generalized weakness    Cervical / Trunk Assessment Cervical / Trunk Assessment: Kyphotic  Communication   Communication: No difficulties  Cognition Arousal/Alertness: Awake/alert Behavior During Therapy: WFL for tasks assessed/performed Overall Cognitive Status: Within Functional Limits for tasks assessed                                        General Comments      Exercises     Assessment/Plan    PT Assessment Patient needs continued PT services  PT Problem List Decreased mobility;Decreased strength;Decreased activity tolerance;Decreased balance;Decreased knowledge of  use of DME       PT Treatment Interventions DME instruction;Gait training;Therapeutic activities;Therapeutic exercise;Patient/family education;Functional mobility training    PT Goals (Current goals can be found in the Care Plan section)  Acute Rehab PT Goals Patient Stated Goal: to get some rest PT Goal Formulation: With patient Time For Goal Achievement: 06/14/18 Potential to Achieve Goals: Good    Frequency Min 3X/week   Barriers to  discharge        Co-evaluation               AM-PAC PT "6 Clicks" Daily Activity  Outcome Measure Difficulty turning over in bed (including adjusting bedclothes, sheets and blankets)?: A Lot Difficulty moving from lying on back to sitting on the side of the bed? : Unable Difficulty sitting down on and standing up from a chair with arms (e.g., wheelchair, bedside commode, etc,.)?: Unable Help needed moving to and from a bed to chair (including a wheelchair)?: A Little Help needed walking in hospital room?: A Little Help needed climbing 3-5 steps with a railing? : A Lot 6 Click Score: 12    End of Session Equipment Utilized During Treatment: Gait belt Activity Tolerance: Patient limited by fatigue Patient left: in bed;with call bell/phone within reach;with bed alarm set   PT Visit Diagnosis: Muscle weakness (generalized) (M62.81);Difficulty in walking, not elsewhere classified (R26.2)    Time: 1419-1430 PT Time Calculation (min) (ACUTE ONLY): 11 min   Charges:   PT Evaluation $PT Eval Moderate Complexity: 1 Mod     PT G Codes:          Rebeca Alert, MPT Pager: [phone]

## 2018-05-31 NOTE — Progress Notes (Signed)
Patient requesting medication for cough.  MD notified via text page.

## 2018-05-31 NOTE — Progress Notes (Signed)
[facility] PROGRESS NOTE  SIDNEY SILBERMAN ZOX:[id number] DOB: 05/11/1930 DOA: 05/28/2018 PCP: Helane Rima, DO   Narrative: 84 year old female DM TY 2 on insulin Rheumatoid arthritis on methotrexate, prednisone, allopurinol-previously Humira Xeljanz Bactrim  Orencia Prior knee surgeries CKD 2-3 CAD Chronic lower extremity wounds?  PAD seen in the past by vascular surgery right ABI 11/2017 noncompressible right lower extremity arteries CVA-has a tendency to metabolic encephalopathy  I took care of this patient 05/27/2017-had infected rheumatoid nodules DDX pyoderma gangrenosum  Readmitted 05/28/2018-extubation CHF, possible community-acquired pneumonia, overnight became encephalopathic 05/30/2018-work-up was done showing overall negative CT scan of head with chronic microvascular changes consistent with small infarcts/age-related   A & Plan:  Acute metabolic encephalopathy-multiple confounders-AKI noted-also on gabapentin 100 at bedtime, , tramadol 1-2 every 6 as needed--meds to be given as at home-RN Aware  Pneumonia-CT chest 7/20 = 1.5 X1.1 nodular opacity RUL, anterior segment LUL consolidation-speech therapy eval today-risk factors prior CVA, metabolic encephalopathy Speech therapy will assess the patient not emergently and discussed diet-I suspect encephalopathy from multiple sedating agents may have had a role to play  ?  Acute systolic heart failure-EF 20-25%-?  Plaquenil versus methotrexate ---EKG no overt ST-T wave changes-defer to cardiology further work-up--? Cath this admit  Acute kidney injury--Baseline creatinine 05/28/2018 bumped on 7/22--discontinue lisinopril 30, , lasix continued  Rheumatoid arthritis-continue Plaquenil 200 twice daily-no recent biologics per Dr. Bo Merino 7/22]-continue allopurinol without renal dosing On d/c-send on prednisone ONLY--family to call [facility] for 1 week post hospital f/u  Pain secondary to rheumatoid-cut back tramadol from 2 6  as needed to 50-monitor mentation diclofenac patch  Anemia of chr disease     Patient has had 3-4 hospitalizations within the past year that I have seen her-it is unclear what CODE STATUS is  DVT prophylaxis: Lovenox code Status: Full family Communication: Discussed with daughter disposition Plan: Inpatient at this time   Mahala Menghini, MD  [facility] Direct contact: [phone] --Via amion app OR  --[email or web address]; password [email or web address]  7PM-7AM contact night coverage as above 05/31/2018, 10:52 AM  LOS: 3 days   Consultants:  none  Procedures:  none  Antimicrobials:  none  Interval history/Subjective:  Poor sleep overall improved mentation good with slight confusion Up to commode--no confusion No cp No fever no chills no n No rash   Objective:  Vitals:  Vitals:   05/30/18 2224 05/31/18 0535  BP: (!) 164/91 (!) 171/91  Pulse: 92 94  Resp:  18  Temp:  98.1 F (36.7 C)  SpO2:      Exam:  eomi ncat in chair s1 s2 tachy slightly abd benign no rebound no guard No le edema Neuro intact Psych euthymic  I have personally reviewed the following:   Labs:  BUN/creatinine 15/0.85--->24/1.3-->1.1  Troponin trend 0.06 and flat  BNP on admission 2077  Iron level 20 T sat 7    Imaging studies:  CT scan head 7/21 microvascular changes-  CT chest as above  Medical tests:  None  Test discussed with performing physician:  None  Decision to obtain old records:  Yes  Review and summation of old records:  Extensively reviewed and summarized  Scheduled Meds: . allopurinol  200 mg Oral Daily  . aspirin EC  81 mg Oral Daily  . calcium-vitamin D  1 tablet Oral Daily  . carvedilol  6.25 mg Oral BID WC  . diclofenac sodium  1 application Topical Daily  . enoxaparin (LOVENOX) injection  40 mg Subcutaneous Q24H  .  ferrous sulfate  325 mg Oral BID WC  . gabapentin  100 mg Oral QHS  . hydrALAZINE  100 mg Oral TID  . hydroxychloroquine  200 mg Oral  BID  . insulin aspart  0-5 Units Subcutaneous QHS  . isosorbide mononitrate  15 mg Oral Daily  . potassium chloride SA  20 mEq Oral Daily  . predniSONE  10 mg Oral Q breakfast  . sodium chloride flush  3 mL Intravenous Q12H  . triamcinolone cream  1 application Topical BID  . vitamin C  500 mg Oral QODAY   Continuous Infusions: . sodium chloride    . azithromycin Stopped (05/30/18 2330)  . cefTRIAXone (ROCEPHIN)  IV Stopped (05/30/18 1740)    Active Problems:   Congestive heart failure (CHF) (HCC)   Hypertensive urgency   Pneumonia of left upper lobe due to infectious organism (HCC)   Elevated brain natriuretic peptide (BNP) level   Elevated troponin   DCM (dilated cardiomyopathy) (HCC)   LOS: 3 days

## 2018-05-31 NOTE — Evaluation (Signed)
Clinical/Bedside Swallow Evaluation Patient Details  Name: [name] [name] MRN: [id number] Date of Birth: March 05, 1930  Today's Date: 05/31/2018 Time: SLP Start Time (ACUTE ONLY): 1139 SLP Stop Time (ACUTE ONLY): 1200 SLP Time Calculation (min) (ACUTE ONLY): 21 min  Past Medical History:  Past Medical History:  Diagnosis Date  . Allergic rhinitis due to pollen 04/27/2007  . Arthritis    "in q joint" (08/07/2013)  . Coronary atherosclerosis of native coronary artery   . Cough   . Disorder of bone and cartilage, unspecified   . Edema 05/03/2013  . Exertional shortness of breath    "sometimes" (08/07/2013)  . First degree atrioventricular block   . Gout, unspecified 04/19/2013  . Hyperlipidemia 04/27/2007  . Hypertension   . Muscle spasm   . Muscle weakness (generalized)   . Myocardial infarction (HCC) ~ 1970  . Onychia and paronychia of toe   . Osteoarthrosis, unspecified whether generalized or localized, unspecified site 04/27/2007  . Other fall   . Other malaise and fatigue   . Prepatellar bursitis   . Seizures (HCC)   . Stroke (HCC) 01/17/2014  . TIA (transient ischemic attack)    "a few one summer" (08/07/2013)  . Type II diabetes mellitus (HCC)    "fasting 90-110s" (08/07/2013)  . Unspecified essential hypertension 08/08/2013  . Unspecified vitamin D deficiency    Past Surgical History:  Past Surgical History:  Procedure Laterality Date  . ABDOMINAL HYSTERECTOMY  04/1980  . APPENDECTOMY  1960  . CARDIAC CATHETERIZATION  2003  . CATARACT EXTRACTION W/ INTRAOCULAR LENS  IMPLANT, BILATERAL Bilateral ~ 2012  . JOINT REPLACEMENT    . REPLACEMENT TOTAL KNEE Right 09/2005  . SHOULDER OPEN ROTATOR CUFF REPAIR Right 07/1999  . TONSILLECTOMY  08/1974  . TOTAL KNEE ARTHROPLASTY Left 08/07/2013   Procedure: TOTAL KNEE ARTHROPLASTY- LEFT;  Surgeon: Dannielle Huh, MD;  Location: [facility] OR;  Service: Orthopedics;  Laterality: Left;  . TRANSTHORACIC ECHOCARDIOGRAM  2003   EF 55-65%; mild  concentric LVH   HPI:  84 yo female adm with AMS, dizziness, weakness. PMH + for RA, CHF.  Pt found to have cardiomegaly, possible LUL pna.  WBC increased to 11.3 from 9.7.  Swallow eval ordered, pt deneis h/o dysphagia.    Assessment / Plan / Recommendation Clinical Impression  Pt with negative cranial nerve exam and passed 3 ounce Yale water test with no deficits.  She did have subtle dry cough which she states is baseline and it did not appear coorelated to po intake- suspect CHF related cough.    No oral residuals nor indications of dysphagia.  Recommend continue regular/thin diet. NO SlP follow up needed.  Thanks.  SLP Visit Diagnosis: Dysphagia, unspecified (R13.10)    Aspiration Risk  No limitations    Diet Recommendation Regular;Thin liquid   Liquid Administration via: Cup;Straw Medication Administration: Whole meds with liquid Supervision: Patient able to self feed Compensations: Slow rate    Other  Recommendations Oral Care Recommendations: Oral care BID   Follow up Recommendations None      Frequency and Duration            Prognosis        Swallow Study   General Date of Onset: 05/31/18 HPI: 84 yo female adm with AMS, dizziness, weakness. PMH + for RA, CHF.  Pt found to have cardiomegaly, possible LUL pna.  WBC increased to 11.3 from 9.7.  Swallow eval ordered, pt deneis h/o dysphagia.  Type of Study: Bedside Swallow  Evaluation Diet Prior to this Study: Regular;Thin liquids Temperature Spikes Noted: No History of Recent Intubation: No Behavior/Cognition: Alert;Cooperative;Pleasant mood Oral Cavity Assessment: Within Functional Limits Oral Care Completed by SLP: No Oral Cavity - Dentition: Adequate natural dentition Vision: Functional for self-feeding Self-Feeding Abilities: Able to feed self Patient Positioning: Upright in bed Baseline Vocal Quality: Normal Volitional Cough: Strong Volitional Swallow: Able to elicit    Oral/Motor/Sensory Function  Overall Oral Motor/Sensory Function: Within functional limits   Ice Chips Ice chips: Not tested   Thin Liquid Thin Liquid: Within functional limits Presentation: Cup;Straw    Nectar Thick Nectar Thick Liquid: Not tested   Honey Thick Honey Thick Liquid: Not tested   Puree Puree: Within functional limits Presentation: Self Fed;Spoon   Solid   GO   Solid: Within functional limits Presentation: Self Fed;Spoon        Chales Abrahams 05/31/2018,12:27 PM   Donavan Burnet, MS [facility] SLP [phone]

## 2018-05-31 NOTE — Progress Notes (Addendum)
Progress Note  Patient Name: [name] [name] Date of Encounter: 05/31/2018  Primary Cardiologist: Chilton Si, MD   Subjective   Denies any further chest pain or shortness of breath  Inpatient Medications    Scheduled Meds: . allopurinol  200 mg Oral Daily  . aspirin EC  81 mg Oral Daily  . calcium-vitamin D  1 tablet Oral Daily  . carvedilol  6.25 mg Oral BID WC  . diclofenac sodium  1 application Topical Daily  . enoxaparin (LOVENOX) injection  40 mg Subcutaneous Q24H  . ferrous sulfate  325 mg Oral BID WC  . gabapentin  100 mg Oral QHS  . hydrALAZINE  100 mg Oral TID  . hydroxychloroquine  200 mg Oral BID  . insulin aspart  0-5 Units Subcutaneous QHS  . isosorbide mononitrate  15 mg Oral Daily  . potassium chloride SA  20 mEq Oral Daily  . predniSONE  10 mg Oral Q breakfast  . sodium chloride flush  3 mL Intravenous Q12H  . triamcinolone cream  1 application Topical BID  . vitamin C  500 mg Oral QODAY   Continuous Infusions: . sodium chloride    . azithromycin Stopped (05/30/18 2330)  . cefTRIAXone (ROCEPHIN)  IV Stopped (05/30/18 1740)   PRN Meds: sodium chloride, acetaminophen, albuterol, ondansetron (ZOFRAN) IV, polyvinyl alcohol, sodium chloride flush, traMADol   Vital Signs    Vitals:   05/30/18 2141 05/30/18 2224 05/31/18 0535 05/31/18 0538  BP: (!) 173/149 (!) 164/91 (!) 171/91   Pulse: 88 92 94   Resp: 16  18   Temp: 99.1 F (37.3 C)  98.1 F (36.7 C)   TempSrc: Oral  Oral   SpO2: 99%     Weight:    192 lb 14.4 oz (87.5 kg)  Height:        Intake/Output Summary (Last 24 hours) at 05/31/2018 1054 Last data filed at 05/31/2018 0737 Gross per 24 hour  Intake 500 ml  Output 1050 ml  Net -550 ml   Filed Weights   05/29/18 0455 05/30/18 0531 05/31/18 0538  Weight: 190 lb 11.2 oz (86.5 kg) 189 lb 9.5 oz (86 kg) 192 lb 14.4 oz (87.5 kg)    Telemetry    Normal sinus rhythm- Personally Reviewed  ECG    No new EKG to review- Personally  Reviewed  Physical Exam   GEN: No acute distress.   Neck: No JVD Cardiac: RRR, no murmurs, rubs, or gallops.  Respiratory: Clear to auscultation bilaterally. GI: Soft, nontender, non-distended  MS: No edema; No deformity. Neuro:  Nonfocal  Psych: Normal affect   Labs    Chemistry Recent Labs  Lab 05/28/18 1613 05/29/18 0707 05/30/18 0449 05/31/18 0440  NA 141 141 143 143  K 4.1 3.3* 4.1 3.6  CL 108 105 108 109  CO2 24 25 25 26   GLUCOSE 154* 108* 94 118*  BUN 17 15 24* 26*  CREATININE 0.83 0.85 1.31* 1.10*  CALCIUM 9.1 9.0 8.7* 8.8*  PROT 6.8  --   --   --   ALBUMIN 3.3*  --   --   --   AST 23  --   --   --   ALT 20  --   --   --   ALKPHOS 81  --   --   --   BILITOT 0.6  --   --   --   GFRNONAA >60 >60 36* 44*  GFRAA >60 >60 41* 51*  ANIONGAP 9 11 10 8      Hematology Recent Labs  Lab 05/28/18 1900 05/29/18 0030 05/30/18 0449  WBC 9.7  --  11.3*  RBC 3.25*  --  3.26*  HGB 9.8*  --  9.8*  HCT 30.1* 30.0* 30.8*  MCV 92.6  --  94.5  MCH 30.2  --  30.1  MCHC 32.6  --  31.8  RDW 17.4*  --  17.7*  PLT 368  --  370    Cardiac Enzymes Recent Labs  Lab 05/29/18 0030 05/29/18 0707 05/29/18 1128  TROPONINI 0.06* 0.05* 0.06*    Recent Labs  Lab 05/28/18 1622  TROPIPOC 0.04     BNP Recent Labs  Lab 05/28/18 1614  BNP 2,077.6*     DDimer No results for input(s): DDIMER in the last 168 hours.   Radiology    Ct Head Wo Contrast  Result Date: 05/29/2018 CLINICAL DATA:  Drowsiness with RIGHT facial droop. EXAM: CT HEAD WITHOUT CONTRAST TECHNIQUE: Contiguous axial images were obtained from the base of the skull through the vertex without intravenous contrast. COMPARISON:  05/28/2018. FINDINGS: Brain: No evidence for acute infarction, hemorrhage, mass lesion, hydrocephalus, or extra-axial fluid. Generalized atrophy. Moderate small vessel disease. Vascular: Calcification of the cavernous internal carotid arteries consistent with cerebrovascular  atherosclerotic disease. No signs of intracranial large vessel occlusion. Skull: Calvarium intact. Sinuses/Orbits: Clear sinuses.  No orbital findings. Other: No mastoid fluid. IMPRESSION: Atrophy and white matter disease, stable in appearance from 05/28/2018. No acute intracranial findings. Electronically Signed   By: Elsie Stain [name]   On: 05/29/2018 21:44    Cardiac Studies   2D echo 05/29/2018 Study Conclusions  - Left ventricle: The cavity size was normal. Wall thickness was   increased in a pattern of moderate LVH. Systolic function was   severely reduced. The estimated ejection fraction was in the   range of 20% to 25%. Although no diagnostic regional wall motion   abnormality was identified, this possibility cannot be completely   excluded on the basis of this study. - Aortic valve: Mildly calcified annulus. Trileaflet; normal   thickness, moderately calcified leaflets. There was mild   regurgitation. - Mitral valve: Mildly calcified annulus. Mildly calcified leaflets   . There was mild to moderate regurgitation. - Left atrium: The atrium was moderately to severely dilated. - Right ventricle: The cavity size was mildly dilated. Wall   thickness was normal. Systolic function was mildly reduced. - Right atrium: The atrium was moderately dilated. - Atrial septum: There was a small patent foramen ovale. There was   a left-to-right shunt. - Tricuspid valve: There was moderate regurgitation. - Pulmonary arteries: Systolic pressure was moderately increased.   PA peak pressure: 47 mm Hg (S). - Pericardium, extracardiac: A trivial pericardial effusion was   identified.  Patient Profile     84 y.o. female with h/o HTN, Rheumatoid Arthritis, diabetes type 2, CAD s/p remote MI, CVA, PVD and chronic diastolic heart failure, admitted for acute CHF and CAP with echo showing new systolic HF, for which cardiology has been consulted, at the request of Dr. Mahala Menghini, Internal Medicine.    Assessment & Plan    1. Elevated troponin -She has had some vague chest pain but this could be related to her underlying pneumonia -Flat trend and minimally elevated and likely related to demand ischemia in the setting of pneumonia. -She does have a history of remote MI and CAD in the past. -given mildly elevated trop and marked LV  dysfunction, will plan right and left heart cath tomorrow -Cardiac catheterization was discussed with the patient fully. The patient understands that risks include but are not limited to stroke (1 in 1000), death (1 in 1000), kidney failure [usually temporary] (1 in 500), bleeding (1 in 200), allergic reaction -creatinine improved to 1.10 -continue ASA 81mg  daily, BB and statin  2.  Elevated BNP -BNP was 2000 but chest x-ray did not show any edema on chest CT did not show edema as well and only a very small right pleural effusion. -Creatinine bumped with diuresis and therefore she is likely not volume overloaded -Suspect elevated BNP is related to her underlying pneumonia  -Would hold diuretics for now and follow renal function -she is net neg 350cc -will plan for right heart cath at time of left heart cath to assess filling pressure  3.  Dilated cardiomyopathy -EF has declined to 20 to 25% since 2D echocardiogram in 2015 which showed normal LV function at 60 to 65%. -?etiology -she did have what sounded like a febrile viral syndrome a few months ago question whether this could be viral cardiomyopathy.  She also has a history of remote CAD and has had some vague chest pain so need to consider ischemia. -plan right and left heart catheterization tomorrow -continue Carvedilol, Hydralazine and Imdur   4.  Hypertension -BP remains poorly controlled  -Continue Hydralazine 100mg  TID -increase carvedilol to 12.5mg  BID  5.  CAP -on antibx per TRH  6.  Small PFO by echo with L>R shunt    I have spent a total of 35 minutes with patient reviewing hospital  notes , telemetry, EKGs, labs and examining patient as well as establishing an assessment and plan that was discussed with the patient.  > 50% of time was spent in direct patient care.     For questions or updates, please contact [facility] Please consult [email or web address] for contact info under Cardiology/STEMI.      Signed, 2016, MD  05/31/2018, 10:54 AM

## 2018-05-31 NOTE — Consult Note (Signed)
Consultation Note Date: 05/31/2018   Patient Name: [name] [name]  DOB: [birth year]  MRN: [id number]  Age / Sex: 84 y.o., female  PCP: [name] Deutscher, DO Referring Physician: Nita Sells, MD  Reason for Consultation: Establishing goals of care  HPI/Patient Profile: 84 y.o. female admitted on 05/28/2018   Clinical Assessment and Goals of Care:  84 yo lady with DM Rheumatoid arthritis, CKD CAD history of CVA, admitted with acute metabolic encephalopathy, pneumonia, AKI, also diagnosed with possible acute systolic CHF EF 78-29%. \  Patient has been seen by cardiology in this hospitalization, she is to undergo L and R heart catheterization as additional workup.   A palliative consult has been requested for goals of care discussions.   [name] is a pleasant lady resting in bed. She is eating breakfast. She is awake and alert. I introduced myself and palliative care as follows: Palliative medicine is specialized medical care for people living with serious illness. It focuses on providing relief from the symptoms and stress of a serious illness. The goal is to improve quality of life for both the patient and the family.  Patient tells me she didn't sleep well, at times, she struggles with insomnia, which is chronic for her. She is aware of the fact that she is hospitalized for PNA, and that her heart is also under evaluation. She has a home health aide, she lives alone.   She has 3 daughters. [name] [phone] is her health care power of attorney, [name] who lives locally is her caregiver, she would want all 3 of her children to make decisions for her jointly if she is ever not able to make her own decisions.   Patient is originally from [place], [place]. 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  Patient states that she has had weakness and dizziness for 2 weeks now, she understands that her heart isn't functioning well, she is aware of her chronic conditions and her acute illness.   Goals, wishes and values discussed, patient's [name] also came in and joined [place] in the conversation.   The patient wishes for full code/full scope. In the event that she is on prolonged ventilator/ICU she would want her family to let her go after a few days. She wouldn't want "tubes and machines long term".  Daughter states that the patient cannot go back home by herself. She is asking about rehab stay after this hospitalization.   See below, thank you for the consult.   NEXT OF KIN  [name], patient has 3 daughters.   SUMMARY OF RECOMMENDATIONS   Full code full scope Patient asking about PT consult. Daughter asking about cardiac cath.  Recommend SNF rehab with palliative care, if deemed appropriate after PT consult.  Continue current mode of care for now.  Thank you for the consult.   Code Status/Advance  Care Planning:  Full code    Symptom Management:    as above   Palliative Prophylaxis:   Delirium Protocol  Additional Recommendations (Limitations, Scope, Preferences):  Full Scope Treatment  Psycho-social/Spiritual:   Desire for further Chaplaincy support:yes  Additional Recommendations: Caregiving  Support/Resources  Prognosis:   Unable to determine  Discharge Planning: [place] for rehab with Palliative care service follow-up      Primary Diagnoses: Present on Admission: **None**   I have reviewed the medical record, interviewed the patient and family, and examined the patient. The following aspects are pertinent.  Past Medical History:  Diagnosis Date  . Allergic rhinitis due to pollen 04/27/2007  . Arthritis    "in q joint" (08/07/2013)  . Coronary atherosclerosis  of native coronary artery   . Cough   . Disorder of bone and cartilage, unspecified   . Edema 05/03/2013  . Exertional shortness of breath    "sometimes" (08/07/2013)  . First degree atrioventricular block   . Gout, unspecified 04/19/2013  . Hyperlipidemia 04/27/2007  . Hypertension   . Muscle spasm   . Muscle weakness (generalized)   . Myocardial infarction ([place]) ~ 1970  . Onychia and paronychia of toe   . Osteoarthrosis, unspecified whether generalized or localized, unspecified site 04/27/2007  . Other fall   . Other malaise and fatigue   . Prepatellar bursitis   . Seizures ([place])   . Stroke ([place]) 01/17/2014  . TIA (transient ischemic attack)    "a few one summer" (08/07/2013)  . Type II diabetes mellitus ([place])    "fasting 90-110s" (08/07/2013)  . Unspecified essential hypertension 08/08/2013  . Unspecified vitamin D deficiency    Social History   Socioeconomic History  . Marital status: Widowed    Spouse name: Not on file  . Number of children: Not on file  . Years of education: Not on file  . Highest education level: Not on file  Occupational History  . Not on file  Social Needs  . Financial resource strain: Not on file  . Food insecurity:    Worry: Not on file    Inability: Not on file  . Transportation needs:    Medical: Not on file    Non-medical: Not on file  Tobacco Use  . Smoking status: Never Smoker  . Smokeless tobacco: Never Used  Substance and Sexual Activity  . Alcohol use: No    Alcohol/week: 0.0 oz  . Drug use: No  . Sexual activity: Never  Lifestyle  . Physical activity:    Days per week: Not on file    Minutes per session: Not on file  . Stress: Not on file  Relationships  . Social connections:    Talks on phone: Not on file    Gets together: Not on file    Attends religious service: Not on file    Active member of club or organization: Not on file    Attends meetings of clubs or organizations: Not on file    Relationship status: Not on  file  Other Topics Concern  . Not on file  Social History Narrative   Widowed   Walks with cane   Family History  Problem Relation Age of Onset  . Heart attack Father    Scheduled Meds: . allopurinol  200 mg Oral Daily  . aspirin EC  81 mg Oral Daily  . calcium-vitamin D  1 tablet Oral Daily  . carvedilol  6.25 mg Oral BID WC  .  diclofenac sodium  1 application Topical Daily  . enoxaparin (LOVENOX) injection  40 mg Subcutaneous Q24H  . ferrous sulfate  325 mg Oral BID WC  . gabapentin  100 mg Oral QHS  . hydrALAZINE  100 mg Oral TID  . hydroxychloroquine  200 mg Oral BID  . insulin aspart  0-5 Units Subcutaneous QHS  . isosorbide mononitrate  15 mg Oral Daily  . potassium chloride SA  20 mEq Oral Daily  . predniSONE  10 mg Oral Q breakfast  . sodium chloride flush  3 mL Intravenous Q12H  . triamcinolone cream  1 application Topical BID  . vitamin C  500 mg Oral QODAY   Continuous Infusions: . sodium chloride    . azithromycin Stopped (05/30/18 2330)  . cefTRIAXone (ROCEPHIN)  IV Stopped (05/30/18 1740)   PRN Meds:.sodium chloride, acetaminophen, albuterol, ondansetron (ZOFRAN) IV, polyvinyl alcohol, sodium chloride flush, traMADol Medications Prior to Admission:  Prior to Admission medications   Medication Sig Start Date End Date Taking? Authorizing Provider  albuterol (PROVENTIL HFA;VENTOLIN HFA) 108 (90 Base) MCG/ACT inhaler Inhale 1 puff into the lungs every 6 (six) hours as needed for wheezing or shortness of breath.   Yes [provider]  allopurinol (ZYLOPRIM) 100 MG tablet Take 200 mg by mouth daily.   Yes [provider]  aspirin EC 81 MG tablet Take 81 mg by mouth daily.   Yes [provider]  calcium-vitamin D (OSCAL WITH D) 500-200 MG-UNIT per tablet Take 1 tablet by mouth daily.    Yes [provider]  CHLOROPHYLL PO Take 5 drops by mouth See admin instructions. MIX INTO 6 OUNCES OF JUICE AND DRINK ONCE A DAY   Yes [provider]  cholecalciferol (VITAMIN D) 1000 units tablet Take 1,000 Units by mouth daily.   Yes [provider]  diclofenac sodium (VOLTAREN) 1 % GEL Apply 1 application topically daily. 02/22/18  Yes [name] Deutscher, DO  ferrous sulfate 325 (65 FE) MG tablet Take 325 mg by mouth daily.    Yes [provider]  furosemide (LASIX) 20 MG tablet Take 1 tablet (20 mg total) by mouth every other day. 05/17/18  Yes [name] Deutscher, DO  gabapentin (NEURONTIN) 100 MG capsule Take 1 capsule (100 mg total) by mouth at bedtime. 05/19/18  Yes [name] Deutscher, DO  hydrALAZINE (APRESOLINE) 100 MG tablet TAKE 1 TABLET BY MOUTH THREE TIMES DAILY 04/05/18  Yes [name] Deutscher, DO  hydroxychloroquine (PLAQUENIL) 200 MG tablet Take 200 mg by mouth 2 (two) times daily.   Yes [provider]  hydroxypropyl methylcellulose / hypromellose (ISOPTO TEARS / GONIOVISC) 2.5 % ophthalmic solution Place 1 drop into both eyes 4 (four) times daily as needed for dry eyes. 07/07/17  Yes [name] Deutscher, DO  labetalol (NORMODYNE) 100 MG tablet TAKE 2 TABLETS BY MOUTH TWICE A DAY 04/06/18  Yes [name] Deutscher, DO  leflunomide (ARAVA) 20 MG tablet Take 20 mg by mouth daily.  05/20/18  Yes [provider]  lisinopril (PRINIVIL,ZESTRIL) 30 MG tablet Take 30 mg by mouth daily.   Yes [provider]  metFORMIN (GLUCOPHAGE) 500 MG tablet Take 1 tablet (500 mg total) by mouth daily with breakfast. 01/28/18  Yes [name] Deutscher, DO  Multiple Vitamins-Minerals (ALIVE ONCE DAILY WOMENS PO) Take 15 mLs by mouth every other day.    Yes [provider]  potassium chloride 20 MEQ TBCR Take 20 mEq by mouth daily. 05/17/18  Yes [name] Deutscher, DO  predniSONE (DELTASONE)  10 MG tablet Take 1 tablet (10 mg total) by mouth daily with breakfast. 03/25/18  Yes [name] Deutscher, DO  traMADol (ULTRAM) 50 MG tablet TAKE 1 TO 2 TABLETS BY MOUTH EVERY 6 HOURS AS NEEDED FOR PAIN Patient taking differently: Take 50-100 mg by  mouth every 6 (six) hours as needed for moderate pain.  05/18/18  Yes [name] Deutscher, DO  triamcinolone cream (KENALOG) 0.1 % Apply 1 application topically 2 (two) times daily.   Yes [provider]  vitamin C (ASCORBIC ACID) 500 MG tablet Take 500 mg by mouth every other day.    Yes [provider]  allopurinol (ZYLOPRIM) 300 MG tablet Take 0.5 tablets (150 mg total) by mouth every evening. 11/29/17 02/02/18  Elodia Florence., MD  blood glucose meter kit and supplies KIT Dispense based on patient and insurance preference. Use up to four times daily as directed. (FOR ICD-9 250.00, 250.01). 07/30/17   [name] Deutscher, DO  glucose blood test strip Test QID dx code E11.9 12/24/17   [name] Deutscher, DO  lisinopril (PRINIVIL,ZESTRIL) 2.5 MG tablet Take 1 tablet (2.5 mg total) by mouth daily. Patient not taking: Reported on 05/24/2018 02/04/18   Donne Tabby, MD  methotrexate (RHEUMATREX) 2.5 MG tablet TAKE 4 TABLETS(10 MG) BY MOUTH 1 TIME A WEEK Patient not taking: Reported on 05/24/2018 03/21/18   [name] Deutscher, DO  Olopatadine HCl 0.2 % SOLN Apply 1 drop to eye daily. Patient not taking: Reported on 05/24/2018 02/22/18   [name] Deutscher, DO  oxycodone (OXY-IR) 5 MG capsule Take 1 capsule (5 mg total) by mouth every 4 (four) hours as needed for pain. Patient not taking: Reported on 05/24/2018 12/03/17   [name] Deutscher, DO  oxyCODONE-acetaminophen (PERCOCET/ROXICET) 5-325 MG tablet Take 1 tablet by mouth every 4 (four) hours as needed for severe pain. Patient not taking: Reported on 05/24/2018 03/14/18   [name] Deutscher, DO  predniSONE (DELTASONE) 10 MG tablet Take 1 tablet (10 mg total) by mouth daily with breakfast. Patient not taking: Reported on 05/24/2018 02/22/18   [name] Deutscher, DO  ranitidine (ZANTAC) 150 MG tablet TAKE 1 TABLET(150 MG) BY MOUTH AT BEDTIME Patient not taking: Reported on 05/28/2018 04/11/18   [name] Deutscher, DO   Allergies  Allergen Reactions  . Clonidine Derivatives  Swelling    [name]  . Fish Allergy Diarrhea, Swelling and Other (See Comments)    Turns skin "black," but can tolerate white fish Salmon- Diarrhea  . Shellfish Allergy Hives  . Doxycycline Rash  . Indomethacin     Reaction not recalled by the patient  . Lyrica [Pregabalin]     Hallucinations   . Methyldopa     Aldomet (for hypertension): Reaction not recalled by the patient  . Orange Fruit [Citrus] Other (See Comments)    Indigestion/heartburn  . Cetirizine Hcl Itching and Rash  . Codeine Itching  . Levaquin [Levofloxacin In D5w] Rash  . Tomato Rash   Review of Systems Stiffness in joints due to rheumatoid arthritis.   Physical Exam Awake alert Sitting up in chair Eating breakfast In no distress Has some joint stiffness Has some generalized pain Trace edema S1 S2 Abdomen soft non tender Clear breath sounds anteriorly  Vital Signs: BP (!) 171/91 (BP Location: Left Arm)   Pulse 94   Temp 98.1 F (36.7 C) (Oral)   Resp 18   Ht _0  (1.626 m)   Wt 87.5 kg (192 lb 14.4  oz)   SpO2 99%   BMI 33.11 kg/m  Pain Scale: 0-10   Pain Score: 0-No pain   SpO2: SpO2: 99 % O2 Device:SpO2: 99 % O2 Flow Rate: .O2 Flow Rate (L/min): 1 L/min  IO: Intake/output summary:   Intake/Output Summary (Last 24 hours) at 05/31/2018 1001 Last data filed at 05/31/2018 0737 Gross per 24 hour  Intake 500 ml  Output 1050 ml  Net -550 ml    LBM:   Baseline Weight: Weight: 86.5 kg (190 lb 11.2 oz) Most recent weight: Weight: 87.5 kg (192 lb 14.4 oz)     Palliative Assessment/Data:   PPS 40%  Time In:  9 Time Out:10   Time Total: 60 min   Greater than 50%  of this time was spent counseling and coordinating care related to the above assessment and plan.  Signed by: Loistine Chance, MD  [phone]  Please contact Palliative Medicine Team phone at [phone] for questions and concerns.  For individual provider:  See Shea Evans

## 2018-05-31 NOTE — Progress Notes (Signed)
Patient and daughter viewed Cardiac cath video via TV education network.

## 2018-05-31 NOTE — Care Management Important Message (Signed)
Important Message  Patient Details  Name: KENDRICK REMIGIO MRN: [id number] Date of Birth: June 22, 1930   Medicare Important Message Given:  Yes    Caren Macadam 05/31/2018, 11:44 AMImportant Message  Patient Details  Name: JAMIE-LEE GALDAMEZ MRN: [id number] Date of Birth: 1930/05/16   Medicare Important Message Given:  Yes    Caren Macadam 05/31/2018, 11:44 AM

## 2018-06-01 ENCOUNTER — Encounter (HOSPITAL_COMMUNITY): Payer: Self-pay | Admitting: Interventional Cardiology

## 2018-06-01 ENCOUNTER — Inpatient Hospital Stay (HOSPITAL_COMMUNITY): Payer: Medicare Other

## 2018-06-01 ENCOUNTER — Encounter (HOSPITAL_COMMUNITY)
Admission: EM | Disposition: A | Discharge status: Skilled Nursing Facility | Payer: Self-pay | Source: Home / Self Care | Attending: Internal Medicine

## 2018-06-01 DIAGNOSIS — I5021 Acute systolic (congestive) heart failure: Secondary | ICD-10-CM

## 2018-06-01 DIAGNOSIS — N183 Chronic kidney disease, stage 3 (moderate): Secondary | ICD-10-CM

## 2018-06-01 HISTORY — PX: RIGHT/LEFT HEART CATH AND CORONARY ANGIOGRAPHY: CATH118266

## 2018-06-01 LAB — CBC WITH DIFFERENTIAL/PLATELET
Basophils Absolute: 0.1 10*3/uL (ref 0.0–0.1)
Basophils Relative: 1 %
Eosinophils Absolute: 0.3 10*3/uL (ref 0.0–0.7)
Eosinophils Relative: 3 %
HCT: 30.1 % — ABNORMAL LOW (ref 36.0–46.0)
Hemoglobin: 9.7 g/dL — ABNORMAL LOW (ref 12.0–15.0)
Lymphocytes Relative: 20 %
Lymphs Abs: 2.1 10*3/uL (ref 0.7–4.0)
MCH: 30.2 pg (ref 26.0–34.0)
MCHC: 32.2 g/dL (ref 30.0–36.0)
MCV: 93.8 fL (ref 78.0–100.0)
Monocytes Absolute: 0.8 10*3/uL (ref 0.1–1.0)
Monocytes Relative: 7 %
Neutro Abs: 7.4 10*3/uL (ref 1.7–7.7)
Neutrophils Relative %: 69 %
Platelets: 409 10*3/uL — ABNORMAL HIGH (ref 150–400)
RBC: 3.21 MIL/uL — ABNORMAL LOW (ref 3.87–5.11)
RDW: 17.2 % — ABNORMAL HIGH (ref 11.5–15.5)
WBC: 10.6 10*3/uL — ABNORMAL HIGH (ref 4.0–10.5)

## 2018-06-01 LAB — CBC
HCT: 31.1 % — ABNORMAL LOW (ref 36.0–46.0)
Hemoglobin: 10 g/dL — ABNORMAL LOW (ref 12.0–15.0)
MCH: 29.9 pg (ref 26.0–34.0)
MCHC: 32.2 g/dL (ref 30.0–36.0)
MCV: 92.8 fL (ref 78.0–100.0)
Platelets: 366 10*3/uL (ref 150–400)
RBC: 3.35 MIL/uL — ABNORMAL LOW (ref 3.87–5.11)
RDW: 16.8 % — ABNORMAL HIGH (ref 11.5–15.5)
WBC: 10 10*3/uL (ref 4.0–10.5)

## 2018-06-01 LAB — BASIC METABOLIC PANEL
Anion gap: 7 (ref 5–15)
BUN: 22 mg/dL (ref 8–23)
CO2: 26 mmol/L (ref 22–32)
Calcium: 8.9 mg/dL (ref 8.9–10.3)
Chloride: 108 mmol/L (ref 98–111)
Creatinine, Ser: 1 mg/dL (ref 0.44–1.00)
GFR calc Af Amer: 57 mL/min — ABNORMAL LOW (ref >60)
GFR calc non Af Amer: 50 mL/min — ABNORMAL LOW (ref >60)
Glucose, Bld: 109 mg/dL — ABNORMAL HIGH (ref 70–99)
Potassium: 3.5 mmol/L (ref 3.5–5.1)
Sodium: 141 mmol/L (ref 135–145)

## 2018-06-01 LAB — POCT I-STAT 3, VENOUS BLOOD GAS (G3P V)
Acid-base deficit: 1 mmol/L (ref 0.0–2.0)
Bicarbonate: 23.3 mmol/L (ref 20.0–28.0)
O2 Saturation: 65 %
TCO2: 24 mmol/L (ref 22–32)
pCO2, Ven: 38.2 mmHg — ABNORMAL LOW (ref 44.0–60.0)
pH, Ven: 7.392 (ref 7.250–7.430)
pO2, Ven: 34 mmHg (ref 32.0–45.0)

## 2018-06-01 LAB — POCT I-STAT 3, ART BLOOD GAS (G3+)
Acid-base deficit: 2 mmol/L (ref 0.0–2.0)
Bicarbonate: 21.3 mmol/L (ref 20.0–28.0)
O2 Saturation: 99 %
TCO2: 22 mmol/L (ref 22–32)
pCO2 arterial: 31.8 mmHg — ABNORMAL LOW (ref 32.0–48.0)
pH, Arterial: 7.434 (ref 7.350–7.450)
pO2, Arterial: 116 mmHg — ABNORMAL HIGH (ref 83.0–108.0)

## 2018-06-01 LAB — RENAL FUNCTION PANEL
Albumin: 3 g/dL — ABNORMAL LOW (ref 3.5–5.0)
Anion gap: 6 (ref 5–15)
BUN: 21 mg/dL (ref 8–23)
CO2: 27 mmol/L (ref 22–32)
Calcium: 8.9 mg/dL (ref 8.9–10.3)
Chloride: 107 mmol/L (ref 98–111)
Creatinine, Ser: 0.98 mg/dL (ref 0.44–1.00)
GFR calc Af Amer: 59 mL/min — ABNORMAL LOW (ref >60)
GFR calc non Af Amer: 51 mL/min — ABNORMAL LOW (ref >60)
Glucose, Bld: 110 mg/dL — ABNORMAL HIGH (ref 70–99)
Phosphorus: 2.7 mg/dL (ref 2.5–4.6)
Potassium: 3.5 mmol/L (ref 3.5–5.1)
Sodium: 140 mmol/L (ref 135–145)

## 2018-06-01 LAB — GLUCOSE, CAPILLARY
Glucose-Capillary: 131 mg/dL — ABNORMAL HIGH (ref 70–99)
Glucose-Capillary: 112 mg/dL — ABNORMAL HIGH (ref 70–99)
Glucose-Capillary: 133 mg/dL — ABNORMAL HIGH (ref 70–99)
Glucose-Capillary: 115 mg/dL — ABNORMAL HIGH (ref 70–99)

## 2018-06-01 LAB — CREATININE, SERUM
Creatinine, Ser: 0.99 mg/dL (ref 0.44–1.00)
GFR calc Af Amer: 58 mL/min — ABNORMAL LOW (ref >60)
GFR calc non Af Amer: 50 mL/min — ABNORMAL LOW (ref >60)

## 2018-06-01 LAB — PROTIME-INR
INR: 1.16
Prothrombin Time: 14.7 seconds (ref 11.4–15.2)

## 2018-06-01 SURGERY — RIGHT/LEFT HEART CATH AND CORONARY ANGIOGRAPHY
Anesthesia: LOCAL

## 2018-06-01 MED ORDER — MIDAZOLAM HCL 2 MG/2ML IJ SOLN
INTRAMUSCULAR | Status: DC | PRN
  Administered 2018-06-01: 1 mg via INTRAVENOUS

## 2018-06-01 MED ORDER — FUROSEMIDE 10 MG/ML IJ SOLN
40.0000 mg | Freq: Once | INTRAMUSCULAR | Status: DC
  Administered 2018-06-01: 40 mg via INTRAVENOUS

## 2018-06-01 MED ORDER — LABETALOL HCL 5 MG/ML IV SOLN
10.0000 mg | INTRAVENOUS | Status: DC | PRN
  Administered 2018-06-01: 10 mg via INTRAVENOUS

## 2018-06-01 MED ORDER — LABETALOL HCL 5 MG/ML IV SOLN
INTRAVENOUS | Status: AC
  Filled 2018-06-01: qty 4

## 2018-06-01 MED ORDER — SODIUM CHLORIDE 0.9% FLUSH: 3.0000 mL | INTRAVENOUS | Status: DC | PRN

## 2018-06-01 MED ORDER — SODIUM CHLORIDE 0.9 % IV SOLN: 250.0000 mL | INTRAVENOUS | Status: DC | PRN

## 2018-06-01 MED ORDER — MIDAZOLAM HCL 2 MG/2ML IJ SOLN
INTRAMUSCULAR | Status: AC
  Filled 2018-06-01: qty 2

## 2018-06-01 MED ORDER — LISINOPRIL 20 MG PO TABS
30.0000 mg | ORAL_TABLET | Freq: Every day | ORAL | Status: DC
  Administered 2018-06-02: 30 mg via ORAL
  Filled 2018-06-01: qty 1

## 2018-06-01 MED ORDER — LIDOCAINE HCL (PF) 1 % IJ SOLN
INTRAMUSCULAR | Status: AC
  Filled 2018-06-01: qty 30

## 2018-06-01 MED ORDER — HEPARIN (PORCINE) IN NACL 1000-0.9 UT/500ML-% IV SOLN
INTRAVENOUS | Status: DC | PRN
  Administered 2018-06-01 (×2): 500 mL

## 2018-06-01 MED ORDER — LIDOCAINE HCL (PF) 1 % IJ SOLN
INTRAMUSCULAR | Status: DC | PRN
  Administered 2018-06-01 (×2): 2 mL via INTRADERMAL

## 2018-06-01 MED ORDER — FUROSEMIDE 10 MG/ML IJ SOLN
INTRAMUSCULAR | Status: AC
  Filled 2018-06-01: qty 4

## 2018-06-01 MED ORDER — IOHEXOL 350 MG/ML SOLN
INTRAVENOUS | Status: DC | PRN
  Administered 2018-06-01: 60 mL

## 2018-06-01 MED ORDER — ACETAMINOPHEN 325 MG PO TABS
650.0000 mg | ORAL_TABLET | ORAL | Status: DC | PRN
  Administered 2018-06-01: 650 mg via ORAL
  Filled 2018-06-01: qty 2

## 2018-06-01 MED ORDER — HEPARIN SODIUM (PORCINE) 1000 UNIT/ML IJ SOLN
INTRAMUSCULAR | Status: DC | PRN
  Administered 2018-06-01: 4500 [IU] via INTRAVENOUS

## 2018-06-01 MED ORDER — HEPARIN SODIUM (PORCINE) 1000 UNIT/ML IJ SOLN
INTRAMUSCULAR | Status: AC
  Filled 2018-06-01: qty 1

## 2018-06-01 MED ORDER — ONDANSETRON HCL 4 MG/2ML IJ SOLN: 4.0000 mg | Freq: Four times a day (QID) | INTRAMUSCULAR | Status: DC | PRN

## 2018-06-01 MED ORDER — VERAPAMIL HCL 2.5 MG/ML IV SOLN
INTRAVENOUS | Status: DC | PRN
  Administered 2018-06-01: 10 mL via INTRA_ARTERIAL

## 2018-06-01 MED ORDER — HEPARIN (PORCINE) IN NACL 1000-0.9 UT/500ML-% IV SOLN
INTRAVENOUS | Status: AC
  Filled 2018-06-01: qty 500

## 2018-06-01 MED ORDER — HYDRALAZINE HCL 20 MG/ML IJ SOLN
INTRAMUSCULAR | Status: AC
  Filled 2018-06-01: qty 1

## 2018-06-01 MED ORDER — VERAPAMIL HCL 2.5 MG/ML IV SOLN
INTRAVENOUS | Status: AC
  Filled 2018-06-01: qty 2

## 2018-06-01 MED ORDER — HEPARIN SODIUM (PORCINE) 5000 UNIT/ML IJ SOLN
5000.0000 [IU] | Freq: Three times a day (TID) | INTRAMUSCULAR | Status: DC
  Administered 2018-06-01 – 2018-06-04 (×8): 5000 [IU] via SUBCUTANEOUS
  Filled 2018-06-01 (×8): qty 1

## 2018-06-01 MED ORDER — HYDRALAZINE HCL 20 MG/ML IJ SOLN
INTRAMUSCULAR | Status: DC | PRN
  Administered 2018-06-01 (×2): 10 mg via INTRAVENOUS
  Administered 2018-06-01: 10 mg

## 2018-06-01 MED ORDER — SODIUM CHLORIDE 0.9% FLUSH
3.0000 mL | Freq: Two times a day (BID) | INTRAVENOUS | Status: DC
  Administered 2018-06-01 – 2018-06-04 (×4): 3 mL via INTRAVENOUS

## 2018-06-01 SURGICAL SUPPLY — 13 items
CATH 5FR JL3.5 JR4 ANG PIG MP (CATHETERS) ×1 IMPLANT
CATH BALLN WEDGE 5F 110CM (CATHETERS) ×1 IMPLANT
CATH LAUNCHER 5F JR4 (CATHETERS) ×1 IMPLANT
DEVICE RAD COMP TR BAND LRG (VASCULAR PRODUCTS) ×1 IMPLANT
GLIDESHEATH SLEND SS 6F .021 (SHEATH) ×1 IMPLANT
GUIDEWIRE INQWIRE 1.5J.035X260 (WIRE) IMPLANT
HOVERMATT SINGLE USE (MISCELLANEOUS) ×1 IMPLANT
INQWIRE 1.5J .035X260CM (WIRE) ×2
KIT HEART LEFT (KITS) ×2 IMPLANT
PACK CARDIAC CATHETERIZATION (CUSTOM PROCEDURE TRAY) ×2 IMPLANT
SHEATH GLIDE SLENDER 4/5FR (SHEATH) ×1 IMPLANT
TRANSDUCER W/STOPCOCK (MISCELLANEOUS) ×2 IMPLANT
TUBING CIL FLEX 10 FLL-RA (TUBING) ×2 IMPLANT

## 2018-06-01 NOTE — NC FL2 (Signed)
[place] MEDICAID FL2 LEVEL OF CARE SCREENING TOOL     IDENTIFICATION  Patient Name: [name] [name] Birthdate: 1930-05-12 Sex: female Admission Date (Current Location): 05/28/2018  [facility] and [place] Number:  Producer, television/film/video and Address:  [facility], [address] [place], [place] [address]      Provider Number: [id number]  Attending Physician Name and Address:  Narda Bonds, MD  Relative Name and Phone Number:       Current Level of Care: Hospital Recommended Level of Care: Skilled Nursing Facility Prior Approval Number:    Date Approved/Denied:   PASRR Number: [id number]  Discharge Plan: SNF    Current Diagnoses: Patient Active Problem List   Diagnosis Date Noted  . Hypertensive urgency   . Pneumonia of left upper lobe due to infectious organism (HCC)   . Elevated brain natriuretic peptide (BNP) level   . Elevated troponin   . DCM (dilated cardiomyopathy) (HCC)   . Congestive heart failure (CHF) (HCC) 05/28/2018  . Diarrhea 02/02/2018  . Hypokalemia 02/02/2018  . SIRS (systemic inflammatory response syndrome) (HCC) 11/26/2017  . Acute pain of right shoulder 10/12/2017  . Adhesive capsulitis of right shoulder 10/12/2017  . Idiopathic chronic gout of multiple sites with tophus 08/25/2017  . High risk medication use 08/25/2017  . Rheumatic nodule 06/28/2017  . History of CHF (congestive heart failure) 02/18/2017  . History of total knee arthroplasty, bilateral 02/18/2017  . History of rotator cuff surgery 02/18/2017  . Obesity (BMI 30.0-34.9) 12/08/2016  . High risk medications (not anticoagulants) long-term use 09/21/2016  . Hammer toe 10/31/2015  . Acute on chronic diastolic heart failure (HCC) 09/12/2015  . Onychomycosis 04/24/2015  . Midline low back pain without sciatica 06/27/2014  . Bilateral edema of lower extremity 06/05/2014  . CKD (chronic kidney disease) stage 3, GFR 30-59 ml/min (HCC) 06/05/2014  . DM  (diabetes mellitus), type 2 with renal complications (HCC) 06/05/2014    Class: Chronic  . Anemia of chronic disease 05/01/2014  . Fluctuating blood pressure 02/06/2014  . Stroke (HCC) 01/17/2014  . Rheumatoid arthritis involving multiple sites (HCC) 12/19/2013    Class: Chronic  . CAD (coronary artery disease) 10/18/2013  . HTN (hypertension) 08/08/2013    Class: Chronic  . Fibromyalgia 05/10/2013  . Reactive airway disease 04/19/2013  . Gout 04/19/2013  . Hyperlipidemia 04/27/2007  . Allergic rhinitis 04/27/2007  . Diverticulosis 04/27/2007    Orientation RESPIRATION BLADDER Height & Weight     Self, Time, Situation, Place  Normal Incontinent, External catheter Weight: 190 lb 0.6 oz (86.2 kg) Height:  5\' 4"  (162.6 cm)  BEHAVIORAL SYMPTOMS/MOOD NEUROLOGICAL BOWEL NUTRITION STATUS      Continent Diet(regular)  AMBULATORY STATUS COMMUNICATION OF NEEDS Skin   Limited Assist Verbally Normal                       Personal Care Assistance Level of Assistance  Bathing, Dressing Bathing Assistance: Limited assistance   Dressing Assistance: Limited assistance     Functional Limitations Info             SPECIAL CARE FACTORS FREQUENCY  PT (By licensed PT), OT (By licensed OT)     PT Frequency: 5/wk OT Frequency: 5/wk            Contractures      Additional Factors Info  Code Status, Allergies, Insulin Sliding Scale Code Status Info: FULL Allergies Info: Clonidine Derivatives, Fish Allergy, Shellfish  Allergy, Doxycycline, Indomethacin, Lyrica Pregabalin, Methyldopa, Orange Fruit Citrus, Cetirizine Hcl, Codeine, Levaquin Levofloxacin In D5w, Tomato   Insulin Sliding Scale Info: 1/day       Current Medications (06/01/2018):  This is the current hospital active medication list Current Facility-Administered Medications  Medication Dose Route Frequency Provider Last Rate Last Dose  . [MAR Hold] 0.9 %  sodium chloride infusion  250 mL Intravenous PRN Hugelmeyer,  Alexis, DO      . 0.9 %  sodium chloride infusion  250 mL Intravenous PRN Sharol Harness, Brittainy M, PA-C      . 0.9% sodium chloride infusion  1 mL/kg/hr Intravenous Continuous Robbie Lis M, PA-C 87.5 mL/hr at 06/01/18 0522 1 mL/kg/hr at 06/01/18 0522  . [MAR Hold] acetaminophen (TYLENOL) tablet 650 mg  650 mg Oral Q4H PRN Hugelmeyer, Alexis, DO      . [MAR Hold] albuterol (PROVENTIL) (2.5 MG/3ML) 0.083% nebulizer solution 2.5 mg  2.5 mg Nebulization Q2H PRN Hall, Carole N, DO      . [MAR Hold] allopurinol (ZYLOPRIM) tablet 200 mg  200 mg Oral Daily Hugelmeyer, Alexis, DO   200 mg at 05/31/18 2117  . [MAR Hold] aspirin EC tablet 81 mg  81 mg Oral Daily Hugelmeyer, Alexis, DO   81 mg at 05/31/18 1001  . [MAR Hold] azithromycin (ZITHROMAX) 500 mg in sodium chloride 0.9 % 250 mL IVPB  500 mg Intravenous Q24H Hugelmeyer, Alexis, DO   Stopped at 05/31/18 2219  . [MAR Hold] calcium-vitamin D (OSCAL WITH D) 500-200 MG-UNIT per tablet 1 tablet  1 tablet Oral Daily Hugelmeyer, Alexis, DO   1 tablet at 05/31/18 1119  . [MAR Hold] carvedilol (COREG) tablet 12.5 mg  12.5 mg Oral BID WC Armanda Magic R, MD   12.5 mg at 06/01/18 0744  . [MAR Hold] cefTRIAXone (ROCEPHIN) 2 g in sodium chloride 0.9 % 100 mL IVPB  2 g Intravenous Q24H Hugelmeyer, Alexis, DO   Stopped at 05/31/18 1554  . [MAR Hold] diclofenac sodium (VOLTAREN) 1 % transdermal gel 1 application  1 application Topical Daily Hugelmeyer, Alexis, DO   1 application at 05/31/18 1002  . [MAR Hold] enoxaparin (LOVENOX) injection 40 mg  40 mg Subcutaneous Q24H Hugelmeyer, Alexis, DO   40 mg at 05/31/18 1002  . [MAR Hold] ferrous sulfate tablet 325 mg  325 mg Oral BID WC Hall, Carole N, DO   325 mg at 05/31/18 1641  . [MAR Hold] gabapentin (NEURONTIN) capsule 100 mg  100 mg Oral QHS Hugelmeyer, Alexis, DO   100 mg at 05/31/18 2117  . [MAR Hold] guaiFENesin (MUCINEX) 12 hr tablet 1,200 mg  1,200 mg Oral BID Rhetta Mura, MD   1,200 mg at 05/31/18 2117   . [MAR Hold] hydrALAZINE (APRESOLINE) tablet 100 mg  100 mg Oral TID Hugelmeyer, Alexis, DO   100 mg at 06/01/18 0842  . [MAR Hold] hydroxychloroquine (PLAQUENIL) tablet 200 mg  200 mg Oral BID Hugelmeyer, Alexis, DO   200 mg at 05/31/18 2117  . [MAR Hold] insulin aspart (novoLOG) injection 0-5 Units  0-5 Units Subcutaneous QHS Hugelmeyer, Alexis, DO      . [MAR Hold] isosorbide mononitrate (IMDUR) 24 hr tablet 15 mg  15 mg Oral Daily Turner, Traci R, MD   15 mg at 05/31/18 1000  . [MAR Hold] ondansetron (ZOFRAN) injection 4 mg  4 mg Intravenous Q6H PRN Hugelmeyer, Alexis, DO      . [MAR Hold] polyvinyl alcohol (LIQUIFILM TEARS) 1.4 % ophthalmic solution 1 drop  1 drop Both Eyes PRN Maurice March, [facility]      . [MAR Hold] potassium chloride SA (K-DUR,KLOR-CON) CR tablet 20 mEq  20 mEq Oral Daily Hugelmeyer, Alexis, DO   20 mEq at 05/31/18 1119  . [MAR Hold] predniSONE (DELTASONE) tablet 10 mg  10 mg Oral Q breakfast Hugelmeyer, Alexis, DO   10 mg at 05/31/18 0809  . [MAR Hold] sodium chloride flush (NS) 0.9 % injection 3 mL  3 mL Intravenous Q12H Hugelmeyer, Alexis, DO   3 mL at 05/31/18 2118  . [MAR Hold] sodium chloride flush (NS) 0.9 % injection 3 mL  3 mL Intravenous PRN Hugelmeyer, Alexis, DO      . sodium chloride flush (NS) 0.9 % injection 3 mL  3 mL Intravenous Q12H Simmons, Brittainy M, PA-C      . sodium chloride flush (NS) 0.9 % injection 3 mL  3 mL Intravenous PRN Robbie Lis M, PA-C      . [MAR Hold] traMADol (ULTRAM) tablet 50 mg  50 mg Oral Q6H PRN Rhetta Mura, MD   50 mg at 05/30/18 2229  . [MAR Hold] triamcinolone cream (KENALOG) 0.1 % 1 application  1 application Topical BID Hugelmeyer, Alexis, DO   1 application at 05/31/18 2118  . [MAR Hold] vitamin C (ASCORBIC ACID) tablet 500 mg  500 mg Oral QODAY Hugelmeyer, Alexis, DO   500 mg at 05/31/18 1119     Discharge Medications: Please see discharge summary for a list of discharge medications.  Relevant Imaging  Results:  Relevant Lab Results:   Additional Information SS#: [id number]  Burna Sis, LCSW

## 2018-06-01 NOTE — Progress Notes (Signed)
Progress Note  Patient Name: [name] [name] Date of Encounter: 06/01/2018  Primary Cardiologist: Chilton Si, MD   Subjective   No complaints today. Denies CP. No dyspnea. Daughter by bedside. [facility]wait[facility].   Inpatient Medications    Scheduled Meds: . allopurinol  200 mg Oral Daily  . aspirin EC  81 mg Oral Daily  . calcium-vitamin D  1 tablet Oral Daily  . carvedilol  12.5 mg Oral BID WC  . diclofenac sodium  1 application Topical Daily  . enoxaparin (LOVENOX) injection  40 mg Subcutaneous Q24H  . ferrous sulfate  325 mg Oral BID WC  . gabapentin  100 mg Oral QHS  . guaiFENesin  1,200 mg Oral BID  . hydrALAZINE  100 mg Oral TID  . hydroxychloroquine  200 mg Oral BID  . insulin aspart  0-5 Units Subcutaneous QHS  . isosorbide mononitrate  15 mg Oral Daily  . potassium chloride SA  20 mEq Oral Daily  . predniSONE  10 mg Oral Q breakfast  . sodium chloride flush  3 mL Intravenous Q12H  . sodium chloride flush  3 mL Intravenous Q12H  . triamcinolone cream  1 application Topical BID  . vitamin C  500 mg Oral QODAY   Continuous Infusions: . sodium chloride    . sodium chloride    . sodium chloride 1 mL/kg/hr (06/01/18 0522)  . azithromycin Stopped (05/31/18 2219)  . cefTRIAXone (ROCEPHIN)  IV Stopped (05/31/18 1554)   PRN Meds: sodium chloride, sodium chloride, acetaminophen, albuterol, ondansetron (ZOFRAN) IV, polyvinyl alcohol, sodium chloride flush, sodium chloride flush, traMADol   Vital Signs    Vitals:   06/01/18 0611 06/01/18 0615 06/01/18 0657 06/01/18 0704  BP:  (!) 184/108 (!) 189/101 (!) 181/113  Pulse:  100 98 99  Resp:  20    Temp:  98.9 F (37.2 C)    TempSrc:  Oral    SpO2:  99%    Weight: 190 lb 0.6 oz (86.2 kg)     Height:        Intake/Output Summary (Last 24 hours) at 06/01/2018 0830 Last data filed at 06/01/2018 0622 Gross per 24 hour  Intake 1237.5 ml  Output -  Net 1237.5 ml   Filed Weights   05/30/18  0531 05/31/18 0538 06/01/18 0611  Weight: 189 lb 9.5 oz (86 kg) 192 lb 14.4 oz (87.5 kg) 190 lb 0.6 oz (86.2 kg)    Telemetry    NSR 90s - Personally Reviewed  ECG    Not performed today - Personally Reviewed  Physical Exam   GEN: Moderately obese, elderly BF in No acute distress.   Neck: No JVD Cardiac: RRR, no murmurs, rubs, or gallops.  Respiratory: Clear to auscultation bilaterally. GI: Soft, nontender, non-distended  MS: No edema; No deformity. Neuro:  Nonfocal  Psych: Normal affect   Labs    Chemistry Recent Labs  Lab 05/28/18 1613  05/30/18 0449 05/31/18 0440 06/01/18 0459  NA 141   < > 143 143 141  140  K 4.1   < > 4.1 3.6 3.5  3.5  CL 108   < > 108 109 108  107  CO2 24   < > 25 26 26  27   GLUCOSE 154*   < > 94 118* 109*  110*  BUN 17   < > 24* 26* 22  21  CREATININE 0.83   < > 1.31* 1.10* 1.00  0.98  CALCIUM 9.1   < >  8.7* 8.8* 8.9  8.9  PROT 6.8  --   --   --   --   ALBUMIN 3.3*  --   --   --  3.0*  AST 23  --   --   --   --   ALT 20  --   --   --   --   ALKPHOS 81  --   --   --   --   BILITOT 0.6  --   --   --   --   GFRNONAA >60   < > 36* 44* 50*  51*  GFRAA >60   < > 41* 51* 57*  59*  ANIONGAP 9   < > 10 8 7  6    < > = values in this interval not displayed.     Hematology Recent Labs  Lab 05/28/18 1900 05/29/18 0030 05/30/18 0449 06/01/18 0459  WBC 9.7  --  11.3* 10.6*  RBC 3.25*  --  3.26* 3.21*  HGB 9.8*  --  9.8* 9.7*  HCT 30.1* 30.0* 30.8* 30.1*  MCV 92.6  --  94.5 93.8  MCH 30.2  --  30.1 30.2  MCHC 32.6  --  31.8 32.2  RDW 17.4*  --  17.7* 17.2*  PLT 368  --  370 409*    Cardiac Enzymes Recent Labs  Lab 05/29/18 0030 05/29/18 0707 05/29/18 1128  TROPONINI 0.06* 0.05* 0.06*    Recent Labs  Lab 05/28/18 1622  TROPIPOC 0.04     BNP Recent Labs  Lab 05/28/18 1614  BNP 2,077.6*     DDimer No results for input(s): DDIMER in the last 168 hours.   Radiology    Dg Chest Port 1 View  Result Date:  06/01/2018 CLINICAL DATA:  Recent pneumonia EXAM: PORTABLE CHEST 1 VIEW COMPARISON:  Chest radiograph and chest CT May 28, 2018 FINDINGS: There has been interval clearing of infiltrate from the left upper lobe. Mild atelectatic change remains in this area. There is no new opacity elsewhere. There is cardiomegaly with mild pulmonary venous hypertension. No adenopathy. There is advanced arthropathy in both shoulders. There is postoperative change in the right shoulder region. IMPRESSION: Interval clearing of infiltrate from left upper lobe. Mild atelectasis remains in this area. No new opacity. There is a degree of underlying pulmonary vascular congestion. There is advanced arthropathy in both shoulders with evidence suggesting avascular necrosis in each humeral head. Note that the nodular opacity in the right upper lobe seen on recent chest CT is not appreciable by radiography. Please see recommendations with respect to recent chest CT report. Electronically Signed   By: May 30, 2018 III [name]   On: 06/01/2018 07:09    Cardiac Studies   2D echo 05/29/2018 Study Conclusions  - Left ventricle: The cavity size was normal. Wall thickness was increased in a pattern of moderate LVH. Systolic function was severely reduced. The estimated ejection fraction was in the range of 20% to 25%. Although no diagnostic regional wall motion abnormality was identified, this possibility cannot be completely excluded on the basis of this study. - Aortic valve: Mildly calcified annulus. Trileaflet; normal thickness, moderately calcified leaflets. There was mild regurgitation. - Mitral valve: Mildly calcified annulus. Mildly calcified leaflets . There was mild to moderate regurgitation. - Left atrium: The atrium was moderately to severely dilated. - Right ventricle: The cavity size was mildly dilated. Wall thickness was normal. Systolic function was mildly reduced. - Right atrium: The atrium was  moderately dilated. - Atrial septum: There was a small patent foramen ovale. There was a left-to-right shunt. - Tricuspid valve: There was moderate regurgitation. - Pulmonary arteries: Systolic pressure was moderately increased. PA peak pressure: 47 mm Hg (S). - Pericardium, extracardiac: A trivial pericardial effusion was identified.   Patient Profile     84 y.o. female with h/o HTN, Rheumatoid Arthritis, diabetes type 2, CAD s/premoteMI, CVA, PVDand chronic diastolic heart failure, admitted for acute CHF and CAP with echo showing new systolic HF, for which cardiology has been consulted, at the request of Dr. Samtani, Internal Medicine.Pt also endorsed recent vague chest discomfort and found to have mildly elevated troponin with flat trend, 0.06x 3.   Assessment & Plan    1. Dilated Cardiomyopathy: EF has declined to 20 to 25% since 2D echocardiogram in 2015 which showed normal LV function at 60 to 65%. ? Etiology. We will plan right and left heart cath today at [facility]. We will continue medical therapy with BB, hydralazine and nitrate. SCr improved. We will try to add an ARB but will wait post cath. If pt tolerates ARB ok, can later transition to Entresto if renal function and BP allows. She appears stable from a volume standpoint. Will use heart cath pressure measurements to help guide further diuresis.   2. Chest Pain and Mildly Elevated Troponin (0.06 x 3): currently CP free. Plan for LHC today to exclude underlying CAD.   3. CAP: antibiotics per IM. She is afebrile.   4. HTN: remains uncontrolled at 181/113 this morning. HR in the 90s. There is room to further titrate Coreg dose to 25 mg BID. Can also increase Imdur to 30 mg. She is maxed out on hydralazine at 100 mg TID. Now that renal function has improved, we can try to add an ARB, such as Losartan, but would prefer to wait post cardiac cath.   6. Small PFO: noted on echo, Lt>>Rt shunt    For questions or updates, please  contact [facility] Please consult [email or web address] for contact info under Cardiology/STEMI.      Signed, Neeva Trew, PA-C  06/01/2018, 8:30 AM    

## 2018-06-01 NOTE — Interval H&P Note (Signed)
Cath Lab Visit (complete for each Cath Lab visit)  Clinical Evaluation Leading to the Procedure:   ACS: Yes.    Non-ACS:    Anginal Classification: CCS IV  Anti-ischemic medical therapy: Minimal Therapy (1 class of medications)  Non-Invasive Test Results: High-risk stress test findings: cardiac mortality >3%/year low EF by echo  Prior CABG: No previous CABG      History and Physical Interval Note:  06/01/2018 12:00 PM  [name] [name]  has presented today for surgery, with the diagnosis of elevated trop - cm  The various methods of treatment have been discussed with the patient and family. After consideration of risks, benefits and other options for treatment, the patient has consented to  Procedure(s): RIGHT/LEFT HEART CATH AND CORONARY ANGIOGRAPHY (N/A) as a surgical intervention .  The patient's history has been reviewed, patient examined, no change in status, stable for surgery.  I have reviewed the patient's chart and labs.  Questions were answered to the patient's satisfaction.     Lance Muss

## 2018-06-01 NOTE — H&P (View-Only) (Signed)
Progress Note  Patient Name: [name] [name] Date of Encounter: 06/01/2018  Primary Cardiologist: Chilton Si, MD   Subjective   No complaints today. Denies CP. No dyspnea. Daughter by bedside. Awaiting [facility].   Inpatient Medications    Scheduled Meds: . allopurinol  200 mg Oral Daily  . aspirin EC  81 mg Oral Daily  . calcium-vitamin D  1 tablet Oral Daily  . carvedilol  12.5 mg Oral BID WC  . diclofenac sodium  1 application Topical Daily  . enoxaparin (LOVENOX) injection  40 mg Subcutaneous Q24H  . ferrous sulfate  325 mg Oral BID WC  . gabapentin  100 mg Oral QHS  . guaiFENesin  1,200 mg Oral BID  . hydrALAZINE  100 mg Oral TID  . hydroxychloroquine  200 mg Oral BID  . insulin aspart  0-5 Units Subcutaneous QHS  . isosorbide mononitrate  15 mg Oral Daily  . potassium chloride SA  20 mEq Oral Daily  . predniSONE  10 mg Oral Q breakfast  . sodium chloride flush  3 mL Intravenous Q12H  . sodium chloride flush  3 mL Intravenous Q12H  . triamcinolone cream  1 application Topical BID  . vitamin C  500 mg Oral QODAY   Continuous Infusions: . sodium chloride    . sodium chloride    . sodium chloride 1 mL/kg/hr (06/01/18 0522)  . azithromycin Stopped (05/31/18 2219)  . cefTRIAXone (ROCEPHIN)  IV Stopped (05/31/18 1554)   PRN Meds: sodium chloride, sodium chloride, acetaminophen, albuterol, ondansetron (ZOFRAN) IV, polyvinyl alcohol, sodium chloride flush, sodium chloride flush, traMADol   Vital Signs    Vitals:   06/01/18 0611 06/01/18 0615 06/01/18 0657 06/01/18 0704  BP:  (!) 184/108 (!) 189/101 (!) 181/113  Pulse:  100 98 99  Resp:  20    Temp:  98.9 F (37.2 C)    TempSrc:  Oral    SpO2:  99%    Weight: 190 lb 0.6 oz (86.2 kg)     Height:        Intake/Output Summary (Last 24 hours) at 06/01/2018 0830 Last data filed at 06/01/2018 0622 Gross per 24 hour  Intake 1237.5 ml  Output -  Net 1237.5 ml   Filed Weights   05/30/18  0531 05/31/18 0538 06/01/18 0611  Weight: 189 lb 9.5 oz (86 kg) 192 lb 14.4 oz (87.5 kg) 190 lb 0.6 oz (86.2 kg)    Telemetry    NSR 90s - Personally Reviewed  ECG    Not performed today - Personally Reviewed  Physical Exam   GEN: Moderately obese, elderly BF in No acute distress.   Neck: No JVD Cardiac: RRR, no murmurs, rubs, or gallops.  Respiratory: Clear to auscultation bilaterally. GI: Soft, nontender, non-distended  MS: No edema; No deformity. Neuro:  Nonfocal  Psych: Normal affect   Labs    Chemistry Recent Labs  Lab 05/28/18 1613  05/30/18 0449 05/31/18 0440 06/01/18 0459  NA 141   < > 143 143 141  140  K 4.1   < > 4.1 3.6 3.5  3.5  CL 108   < > 108 109 108  107  CO2 24   < > 25 26 26  27   GLUCOSE 154*   < > 94 118* 109*  110*  BUN 17   < > 24* 26* 22  21  CREATININE 0.83   < > 1.31* 1.10* 1.00  0.98  CALCIUM 9.1   < >  8.7* 8.8* 8.9  8.9  PROT 6.8  --   --   --   --   ALBUMIN 3.3*  --   --   --  3.0*  AST 23  --   --   --   --   ALT 20  --   --   --   --   ALKPHOS 81  --   --   --   --   BILITOT 0.6  --   --   --   --   GFRNONAA >60   < > 36* 44* 50*  51*  GFRAA >60   < > 41* 51* 57*  59*  ANIONGAP 9   < > 10 8 7  6    < > = values in this interval not displayed.     Hematology Recent Labs  Lab 05/28/18 1900 05/29/18 0030 05/30/18 0449 06/01/18 0459  WBC 9.7  --  11.3* 10.6*  RBC 3.25*  --  3.26* 3.21*  HGB 9.8*  --  9.8* 9.7*  HCT 30.1* 30.0* 30.8* 30.1*  MCV 92.6  --  94.5 93.8  MCH 30.2  --  30.1 30.2  MCHC 32.6  --  31.8 32.2  RDW 17.4*  --  17.7* 17.2*  PLT 368  --  370 409*    Cardiac Enzymes Recent Labs  Lab 05/29/18 0030 05/29/18 0707 05/29/18 1128  TROPONINI 0.06* 0.05* 0.06*    Recent Labs  Lab 05/28/18 1622  TROPIPOC 0.04     BNP Recent Labs  Lab 05/28/18 1614  BNP 2,077.6*     DDimer No results for input(s): DDIMER in the last 168 hours.   Radiology    Dg Chest Port 1 View  Result Date:  06/01/2018 CLINICAL DATA:  Recent pneumonia EXAM: PORTABLE CHEST 1 VIEW COMPARISON:  Chest radiograph and chest CT May 28, 2018 FINDINGS: There has been interval clearing of infiltrate from the left upper lobe. Mild atelectatic change remains in this area. There is no new opacity elsewhere. There is cardiomegaly with mild pulmonary venous hypertension. No adenopathy. There is advanced arthropathy in both shoulders. There is postoperative change in the right shoulder region. IMPRESSION: Interval clearing of infiltrate from left upper lobe. Mild atelectasis remains in this area. No new opacity. There is a degree of underlying pulmonary vascular congestion. There is advanced arthropathy in both shoulders with evidence suggesting avascular necrosis in each humeral head. Note that the nodular opacity in the right upper lobe seen on recent chest CT is not appreciable by radiography. Please see recommendations with respect to recent chest CT report. Electronically Signed   By: May 30, 2018 III [name]   On: 06/01/2018 07:09    Cardiac Studies   2D echo 05/29/2018 Study Conclusions  - Left ventricle: The cavity size was normal. Wall thickness was increased in a pattern of moderate LVH. Systolic function was severely reduced. The estimated ejection fraction was in the range of 20% to 25%. Although no diagnostic regional wall motion abnormality was identified, this possibility cannot be completely excluded on the basis of this study. - Aortic valve: Mildly calcified annulus. Trileaflet; normal thickness, moderately calcified leaflets. There was mild regurgitation. - Mitral valve: Mildly calcified annulus. Mildly calcified leaflets . There was mild to moderate regurgitation. - Left atrium: The atrium was moderately to severely dilated. - Right ventricle: The cavity size was mildly dilated. Wall thickness was normal. Systolic function was mildly reduced. - Right atrium: The atrium was  moderately dilated. - Atrial septum: There was a small patent foramen ovale. There was a left-to-right shunt. - Tricuspid valve: There was moderate regurgitation. - Pulmonary arteries: Systolic pressure was moderately increased. PA peak pressure: 47 mm Hg (S). - Pericardium, extracardiac: A trivial pericardial effusion was identified.   Patient Profile     84 y.o. female with h/o HTN, Rheumatoid Arthritis, diabetes type 2, CAD s/premoteMI, CVA, PVDand chronic diastolic heart failure, admitted for acute CHF and CAP with echo showing new systolic HF, for which cardiology has been consulted, at the request of Dr. Mahala Menghini, Internal Medicine.Pt also endorsed recent vague chest discomfort and found to have mildly elevated troponin with flat trend, 0.06x 3.   Assessment & Plan    1. Dilated Cardiomyopathy: EF has declined to 20 to 25% since 2D echocardiogram in 2015 which showed normal LV function at 60 to 65%. ? Etiology. We will plan right and left heart cath today at [facility]. We will continue medical therapy with BB, hydralazine and nitrate. SCr improved. We will try to add an ARB but will wait post cath. If pt tolerates ARB ok, can later transition to [facility] if renal function and BP allows. She appears stable from a volume standpoint. Will use heart cath pressure measurements to help guide further diuresis.   2. Chest Pain and Mildly Elevated Troponin (0.06 x 3): currently CP free. Plan for LHC today to exclude underlying CAD.   3. CAP: antibiotics per IM. She is afebrile.   4. HTN: remains uncontrolled at 181/113 this morning. HR in the 90s. There is room to further titrate Coreg dose to 25 mg BID. Can also increase Imdur to 30 mg. She is maxed out on hydralazine at 100 mg TID. Now that renal function has improved, we can try to add an ARB, such as Losartan, but would prefer to wait post cardiac cath.   6. Small PFO: noted on echo, Lt>>Rt shunt    For questions or updates, please  contact [facility] Please consult [email or web address] for contact info under Cardiology/STEMI.      Signed, Robbie Lis, PA-C  06/01/2018, 8:30 AM

## 2018-06-01 NOTE — Progress Notes (Signed)
Received report that pt will remain at [facility] and admit to Telemetry post cardiac cath. SRP,RN

## 2018-06-01 NOTE — Progress Notes (Signed)
PROGRESS NOTE    DEARDRA NURENBERG  GYB:[id number] DOB: 11/03/1930 DOA: 05/28/2018 PCP: Helane Rima, DO   Brief Narrative: [name] [name] is a 84 y.o. female with a history of rheumatoid arthritis, diabetes mellitus, CKD stage 3, CAD, PAD, CVA. Patient presented secondary to weakness, found to have an acute heart failure exacerbation in addition to pneumonia. Currently on antibiotics. Concern for ischemic etiology of cardiomyopathy and patient underwent R/L cardiac cath without evidence of significant ischemia.   Assessment & Plan:   Active Problems:   Congestive heart failure (CHF) (HCC)   Hypertensive urgency   Pneumonia of left upper lobe due to infectious organism (HCC)   Elevated brain natriuretic peptide (BNP) level   Elevated troponin   DCM (dilated cardiomyopathy) (HCC)   Acute on chronic systolic heart failure New EF of 20-25%, down from 60-65% 4 years prior. Grade 1 diastolic dysfunction. Cardiology consulted for management. Currently asymptomatic. Troponin mildly elevated with flat trend. Cardiac catheterization significant for mild-moderate CAD. Previously diuresed, which has now ben discontinued. -Cardiology recommendations -Continue Coreg  Essential hypertension On lisinopril as an outpatient which has been held secondary to AKI -Continue Imdur/Coreg -Restart lisinopril as AKI has resolved and blood pressure is uncontrolled  Acute metabolic encephalopathy Multifactorial per chart review. Resolved currently.  Left upper lobe pneumonia -Continue Ceftriaxone/azithromycin; consider 5 day course if symptoms improved  Acute metabolic encephalopathy CT head significant for no acute process. Thought possibly secondary to medications. Resolved.  Acute kidney injury Baseline creatinine of 0.9. Peak of 1.31 which has now resolved.  Rheumatoid arthritis Patient on prednisone and methotrexate as an outpatient. Rheum called by prior hospitalist. Plan is to continue  Plaquenil and allopurinol. On discharge, send home on prednisone only with rheumatology follow-up 1 week post-discharge for hospital follow-up. -Continue Plaquenil until discharge -Continue prednisone  Gout On allopurinol as an outpatient -Continue allopurinol  Anemia of chronic disease Stable. No evidence of acute bleeding.  Chronic pain -Continue Tramadol  Right upper lobe nodule Outpatient follow-up. 3 month repeat chest CT vs PET-CT vs tissue sampling  History of diabetes mellitus Currently well controlled with last hemoglobin A1C of 5.4%. On metformin as an outpatient. -Continue SSI   DVT prophylaxis: Heparin subq Code Status:   Code Status: Full Code Family Communication: None at bedside Disposition Plan: Discharge pending cardiology workup. PT recommending SNF.   Consultants:   Cardiology  Procedures:    Transthoracic Echocardiogram (7/21) Study Conclusions  - Left ventricle: The cavity size was normal. Wall thickness was   increased in a pattern of moderate LVH. Systolic function was   severely reduced. The estimated ejection fraction was in the   range of 20% to 25%. Although no diagnostic regional wall motion   abnormality was identified, this possibility cannot be completely   excluded on the basis of this study. - Aortic valve: Mildly calcified annulus. Trileaflet; normal   thickness, moderately calcified leaflets. There was mild   regurgitation. - Mitral valve: Mildly calcified annulus. Mildly calcified leaflets   . There was mild to moderate regurgitation. - Left atrium: The atrium was moderately to severely dilated. - Right ventricle: The cavity size was mildly dilated. Wall   thickness was normal. Systolic function was mildly reduced. - Right atrium: The atrium was moderately dilated. - Atrial septum: There was a small patent foramen ovale. There was   a left-to-right shunt. - Tricuspid valve: There was moderate regurgitation. - Pulmonary  arteries: Systolic pressure was moderately increased.   PA peak pressure:  47 mm Hg (S). - Pericardium, extracardiac: A trivial pericardial effusion was   identified.  Cardiac cath (7/24)  LV end diastolic pressure is moderately elevated.  There is no aortic valve stenosis.  Hemodynamic findings consistent with moderate pulmonary hypertension.  CO 5.68 L/min; CI 2.97; Ao sat 99%; PA sat 65%; mean PA 42 mm Hg; mean PCWP 28 mm Hg  Nonobstructive CAD.   Nonischemic cardiomyopathy with mild to moderate CAD.   Antimicrobials:  Vancomycin (7/20>>  Azithromycin (7/20>>    Subjective: No chest pain or dyspnea.  Objective: Vitals:   06/01/18 0611 06/01/18 0615 06/01/18 0657 06/01/18 0704  BP:  (!) 184/108 (!) 189/101 (!) 181/113  Pulse:  100 98 99  Resp:  20    Temp:  98.9 F (37.2 C)    TempSrc:  Oral    SpO2:  99%    Weight: 86.2 kg (190 lb 0.6 oz)     Height:        Intake/Output Summary (Last 24 hours) at 06/01/2018 0828 Last data filed at 06/01/2018 0622 Gross per 24 hour  Intake 1237.5 ml  Output -  Net 1237.5 ml   Filed Weights   05/30/18 0531 05/31/18 0538 06/01/18 0611  Weight: 86 kg (189 lb 9.5 oz) 87.5 kg (192 lb 14.4 oz) 86.2 kg (190 lb 0.6 oz)    Examination:  General exam: Appears calm and comfortable Respiratory system: Clear to auscultation. Respiratory effort normal. Cardiovascular system: S1 & S2 heard, tachycardia, regular rhythm. No murmurs. Gastrointestinal system: Abdomen is nondistended, soft and nontender. Decreased bowel sounds heard. Central nervous system: Alert and oriented. No focal neurological deficits. Extremities: No edema. No calf tenderness Skin: No cyanosis. No rashes Psychiatry: Judgement and insight appear normal. Mood & affect appropriate.    Data Reviewed: I have personally reviewed following labs and imaging studies  CBC: Recent Labs  Lab 05/28/18 1900 05/29/18 0030 05/30/18 0449 06/01/18 0459  WBC 9.7  --   11.3* 10.6*  NEUTROABS 7.5  --   --  7.4  HGB 9.8*  --  9.8* 9.7*  HCT 30.1* 30.0* 30.8* 30.1*  MCV 92.6  --  94.5 93.8  PLT 368  --  370 409*   Basic Metabolic Panel: Recent Labs  Lab 05/28/18 1613 05/29/18 0030 05/29/18 0707 05/30/18 0449 05/31/18 0440 06/01/18 0459  NA 141  --  141 143 143 141  140  K 4.1  --  3.3* 4.1 3.6 3.5  3.5  CL 108  --  105 108 109 108  107  CO2 24  --  25 25 26 26  27   GLUCOSE 154*  --  108* 94 118* 109*  110*  BUN 17  --  15 24* 26* 22  21  CREATININE 0.83  --  0.85 1.31* 1.10* 1.00  0.98  CALCIUM 9.1  --  9.0 8.7* 8.8* 8.9  8.9  MG  --  1.7  --  1.7  --   --   PHOS  --   --   --   --   --  2.7   GFR: Estimated Creatinine Clearance: 42.9 mL/min (by C-G formula based on SCr of 1 mg/dL). Liver Function Tests: Recent Labs  Lab 05/28/18 1613 06/01/18 0459  AST 23  --   ALT 20  --   ALKPHOS 81  --   BILITOT 0.6  --   PROT 6.8  --   ALBUMIN 3.3* 3.0*   No results for input(s): LIPASE,  AMYLASE in the last 168 hours. No results for input(s): AMMONIA in the last 168 hours. Coagulation Profile: Recent Labs  Lab 06/01/18 0459  INR 1.16   Cardiac Enzymes: Recent Labs  Lab 05/29/18 0030 05/29/18 0707 05/29/18 1128  TROPONINI 0.06* 0.05* 0.06*   BNP (last 3 results) No results for input(s): PROBNP in the last 8760 hours. HbA1C: No results for input(s): HGBA1C in the last 72 hours. CBG: Recent Labs  Lab 05/31/18 0801 05/31/18 1140 05/31/18 1704 05/31/18 2216 06/01/18 0723  GLUCAP 113* 174* 159* 96 131*   Lipid Profile: No results for input(s): CHOL, HDL, LDLCALC, TRIG, CHOLHDL, LDLDIRECT in the last 72 hours. Thyroid Function Tests: No results for input(s): TSH, T4TOTAL, FREET4, T3FREE, THYROIDAB in the last 72 hours. Anemia Panel: No results for input(s): VITAMINB12, FOLATE, FERRITIN, TIBC, IRON, RETICCTPCT in the last 72 hours. Sepsis Labs: Recent Labs  Lab 05/28/18 1721  LATICACIDVEN 0.84    Recent Results  (from the past 240 hour(s))  Urine culture     Status: None   Collection Time: 05/28/18  4:14 PM  Result Value Ref Range Status   Specimen Description   Final    URINE, CLEAN CATCH Performed at [facility], [address], [place], [place] [address]    Special Requests   Final    NONE Performed at [facility], [address], [place], [place] [address]    Culture   Final    NO GROWTH Performed at [facility], [address], [place], [place] [address]    Report Status 05/29/2018 FINAL  Final  Blood culture (routine x 2)     Status: None (Preliminary result)   Collection Time: 05/28/18  4:17 PM  Result Value Ref Range Status   Specimen Description   Final    BLOOD RIGHT ANTECUBITAL Performed at [facility], [address] [place], [place] [address]    Special Requests   Final    BOTTLES DRAWN AEROBIC AND ANAEROBIC Blood Culture adequate volume Performed at [facility], [address], [place], [place] [address]    Culture   Final    NO GROWTH 2 DAYS Performed at [facility], [address] [place], [place] [address]    Report Status PENDING  Incomplete  Blood culture (routine x 2)     Status: None (Preliminary result)   Collection Time: 05/28/18  4:22 PM  Result Value Ref Range Status   Specimen Description   Final    BLOOD RIGHT ANTECUBITAL Performed at [facility], [address], [place], [place] [address]    Special Requests   Final    BOTTLES DRAWN AEROBIC AND ANAEROBIC Blood Culture results may not be optimal due to an inadequate volume of blood received in culture bottles Performed at [facility], [address], [place], [place] [address]    Culture   Final    NO GROWTH 2 DAYS Performed at [facility], [address] [place], [place] [address]    Report Status PENDING  Incomplete         Radiology Studies: Dg  Chest Port 1 View  Result Date: 06/01/2018 CLINICAL DATA:  Recent pneumonia EXAM: PORTABLE CHEST 1 VIEW COMPARISON:  Chest radiograph and chest CT May 28, 2018 FINDINGS: There has been interval clearing of infiltrate from the left upper lobe. Mild atelectatic change remains in this area. There is no new  opacity elsewhere. There is cardiomegaly with mild pulmonary venous hypertension. No adenopathy. There is advanced arthropathy in both shoulders. There is postoperative change in the right shoulder region. IMPRESSION: Interval clearing of infiltrate from left upper lobe. Mild atelectasis remains in this area. No new opacity. There is a degree of underlying pulmonary vascular congestion. There is advanced arthropathy in both shoulders with evidence suggesting avascular necrosis in each humeral head. Note that the nodular opacity in the right upper lobe seen on recent chest CT is not appreciable by radiography. Please see recommendations with respect to recent chest CT report. Electronically Signed   By: Bretta Bang III [name]   On: 06/01/2018 07:09        Scheduled Meds: . allopurinol  200 mg Oral Daily  . aspirin EC  81 mg Oral Daily  . calcium-vitamin D  1 tablet Oral Daily  . carvedilol  12.5 mg Oral BID WC  . diclofenac sodium  1 application Topical Daily  . enoxaparin (LOVENOX) injection  40 mg Subcutaneous Q24H  . ferrous sulfate  325 mg Oral BID WC  . gabapentin  100 mg Oral QHS  . guaiFENesin  1,200 mg Oral BID  . hydrALAZINE  100 mg Oral TID  . hydroxychloroquine  200 mg Oral BID  . insulin aspart  0-5 Units Subcutaneous QHS  . isosorbide mononitrate  15 mg Oral Daily  . potassium chloride SA  20 mEq Oral Daily  . predniSONE  10 mg Oral Q breakfast  . sodium chloride flush  3 mL Intravenous Q12H  . sodium chloride flush  3 mL Intravenous Q12H  . triamcinolone cream  1 application Topical BID  . vitamin C  500 mg Oral QODAY   Continuous Infusions: . sodium chloride    .  sodium chloride    . sodium chloride 1 mL/kg/hr (06/01/18 0522)  . azithromycin Stopped (05/31/18 2219)  . cefTRIAXone (ROCEPHIN)  IV Stopped (05/31/18 1554)     LOS: 4 days     Jacquelin Hawking, MD [facility] 06/01/2018, 8:28 AM Pager: [phone]  If 7PM-7AM, please contact night-coverage [email or web address] 06/01/2018, 8:28 AM

## 2018-06-02 DIAGNOSIS — I10 Essential (primary) hypertension: Secondary | ICD-10-CM

## 2018-06-02 DIAGNOSIS — I509 Heart failure, unspecified: Secondary | ICD-10-CM

## 2018-06-02 LAB — GLUCOSE, CAPILLARY
Glucose-Capillary: 92 mg/dL (ref 70–99)
Glucose-Capillary: 147 mg/dL — ABNORMAL HIGH (ref 70–99)
Glucose-Capillary: 134 mg/dL — ABNORMAL HIGH (ref 70–99)
Glucose-Capillary: 126 mg/dL — ABNORMAL HIGH (ref 70–99)

## 2018-06-02 LAB — BASIC METABOLIC PANEL
Anion gap: 12 (ref 5–15)
BUN: 14 mg/dL (ref 8–23)
CO2: 23 mmol/L (ref 22–32)
Calcium: 8.6 mg/dL — ABNORMAL LOW (ref 8.9–10.3)
Chloride: 102 mmol/L (ref 98–111)
Creatinine, Ser: 1.07 mg/dL — ABNORMAL HIGH (ref 0.44–1.00)
GFR calc Af Amer: 53 mL/min — ABNORMAL LOW (ref >60)
GFR calc non Af Amer: 46 mL/min — ABNORMAL LOW (ref >60)
Glucose, Bld: 93 mg/dL (ref 70–99)
Potassium: 3.6 mmol/L (ref 3.5–5.1)
Sodium: 137 mmol/L (ref 135–145)

## 2018-06-02 MED ORDER — ISOSORBIDE MONONITRATE ER 30 MG PO TB24
30.0000 mg | ORAL_TABLET | Freq: Every day | ORAL | Status: DC
  Administered 2018-06-03 – 2018-06-04 (×2): 30 mg via ORAL
  Filled 2018-06-02 (×2): qty 1

## 2018-06-02 MED ORDER — LOSARTAN POTASSIUM 50 MG PO TABS
100.0000 mg | ORAL_TABLET | Freq: Every day | ORAL | Status: DC
  Administered 2018-06-03 – 2018-06-04 (×2): 100 mg via ORAL
  Filled 2018-06-02 (×2): qty 2

## 2018-06-02 MED ORDER — AZITHROMYCIN 250 MG PO TABS
500.0000 mg | ORAL_TABLET | Freq: Every day | ORAL | Status: DC
  Administered 2018-06-02 – 2018-06-03 (×2): 500 mg via ORAL
  Filled 2018-06-02 (×2): qty 2

## 2018-06-02 NOTE — Plan of Care (Signed)
Pt to remain free of falls. Call bell and personal items are within reach. Pt's bed alarm was turned on.

## 2018-06-02 NOTE — Progress Notes (Signed)
PROGRESS NOTE    [name] [name]  OVA:[id number] DOB: 1930-09-29 DOA: 05/28/2018 PCP: Helane Rima, DO   Brief Narrative: [name] [name] is a 84 y.o. female with a history of rheumatoid arthritis, diabetes mellitus, CKD stage 3, CAD, PAD, CVA. Patient presented secondary to weakness, found to have an acute heart failure exacerbation in addition to pneumonia. Currently on antibiotics. Concern for ischemic etiology of cardiomyopathy and patient underwent R/L cardiac cath without evidence of significant ischemia.   Assessment & Plan:   Active Problems:   Congestive heart failure (CHF) (HCC)   Hypertensive urgency   Pneumonia of left upper lobe due to infectious organism (HCC)   Elevated brain natriuretic peptide (BNP) level   Elevated troponin   DCM (dilated cardiomyopathy) (HCC)   Acute systolic heart failure (HCC)   Acute on chronic systolic heart failure New EF of 20-25%, down from 60-65% 4 years prior. Grade 1 diastolic dysfunction. Cardiology consulted for management. Currently asymptomatic. Troponin mildly elevated with flat trend. Cardiac catheterization significant for mild-moderate CAD. Previously diuresed, which has now ben discontinued. -Cardiology recommendations -Continue Coreg -Pursue disposition  Essential hypertension On lisinopril as an outpatient which has been held secondary to AKI -Continue Imdur/Coreg -Restart lisinopril as AKI has resolved and blood pressure is uncontrolled -Continue to optimize.  Still uncontrolled  Acute metabolic encephalopathy Multifactorial per chart review. Resolved currently.  Left upper lobe pneumonia -Continue Ceftriaxone/azithromycin; consider 5 day course if symptoms improved  Acute metabolic encephalopathy CT head significant for no acute process. Thought possibly secondary to medications. Resolved.  Acute kidney injury Baseline creatinine of 0.9. Peak of 1.31 which has now resolved.  Rheumatoid arthritis Patient on  prednisone and methotrexate as an outpatient. Rheum called by prior hospitalist. Plan is to continue Plaquenil and allopurinol. On discharge, send home on prednisone only with rheumatology follow-up 1 week post-discharge for hospital follow-up. -Continue Plaquenil until discharge -Continue prednisone  Gout On allopurinol as an outpatient -Continue allopurinol  Anemia of chronic disease Stable. No evidence of acute bleeding.  Chronic pain -Continue Tramadol  Right upper lobe nodule Outpatient follow-up. 3 month repeat chest CT vs PET-CT vs tissue sampling  History of diabetes mellitus Currently well controlled with last hemoglobin A1C of 5.4%. On metformin as an outpatient. -Continue SSI   DVT prophylaxis: Heparin subq Code Status:   Code Status: Full Code Family Communication: None at bedside Disposition Plan: Discharge pending cardiology workup. PT recommending SNF.   Consultants:   Cardiology  Procedures:    Transthoracic Echocardiogram (7/21) Study Conclusions  - Left ventricle: The cavity size was normal. Wall thickness was   increased in a pattern of moderate LVH. Systolic function was   severely reduced. The estimated ejection fraction was in the   range of 20% to 25%. Although no diagnostic regional wall motion   abnormality was identified, this possibility cannot be completely   excluded on the basis of this study. - Aortic valve: Mildly calcified annulus. Trileaflet; normal   thickness, moderately calcified leaflets. There was mild   regurgitation. - Mitral valve: Mildly calcified annulus. Mildly calcified leaflets   . There was mild to moderate regurgitation. - Left atrium: The atrium was moderately to severely dilated. - Right ventricle: The cavity size was mildly dilated. Wall   thickness was normal. Systolic function was mildly reduced. - Right atrium: The atrium was moderately dilated. - Atrial septum: There was a small patent foramen ovale. There  was   a left-to-right shunt. - Tricuspid valve: There was  moderate regurgitation. - Pulmonary arteries: Systolic pressure was moderately increased.   PA peak pressure: 47 mm Hg (S). - Pericardium, extracardiac: A trivial pericardial effusion was   identified.  Cardiac cath (7/24)  LV end diastolic pressure is moderately elevated.  There is no aortic valve stenosis.  Hemodynamic findings consistent with moderate pulmonary hypertension.  CO 5.68 L/min; CI 2.97; Ao sat 99%; PA sat 65%; mean PA 42 mm Hg; mean PCWP 28 mm Hg  Nonobstructive CAD.   Nonischemic cardiomyopathy with mild to moderate CAD.   Antimicrobials:  Vancomycin (7/20>>  Azithromycin (7/20>>    Subjective: No new complaints.   No chest pain,  No shortness of breath    Objective: Vitals:   06/02/18 0822 06/02/18 1129 06/02/18 1624 06/02/18 1640  BP: (!) 165/89 (!) 149/83 (!) 158/96 (!) 180/108  Pulse: 95 87 91   Resp: (!) 29 (!) 26 (!) 21 19  Temp: 98.3 F (36.8 C) 99.1 F (37.3 C) 98.3 F (36.8 C)   TempSrc: Oral Oral Oral   SpO2: 100% 99% 99%   Weight:      Height:        Intake/Output Summary (Last 24 hours) at 06/02/2018 2025 Last data filed at 06/02/2018 1829 Gross per 24 hour  Intake 1253.3 ml  Output 400 ml  Net 853.3 ml   Filed Weights   06/01/18 0611 06/01/18 1500 06/02/18 0500  Weight: 86.2 kg (190 lb 0.6 oz) 87.8 kg (193 lb 9.6 oz) 87.3 kg (192 lb 7.4 oz)    Examination:  General exam: Appears calm and comfortable Respiratory system: Clear to auscultation. Respiratory effort normal. Cardiovascular system: S1 & S2. Gastrointestinal system: Abdomen is nondistended, soft and nontender. Decreased bowel sounds heard. Central nervous system: Alert and oriented. No focal neurological deficits. Extremities: No edema. No calf tenderness   Data Reviewed: I have personally reviewed following labs and imaging studies  CBC: Recent Labs  Lab 05/28/18 1900 05/29/18 0030  05/30/18 0449 06/01/18 0459 06/01/18 1657  WBC 9.7  --  11.3* 10.6* 10.0  NEUTROABS 7.5  --   --  7.4  --   HGB 9.8*  --  9.8* 9.7* 10.0*  HCT 30.1* 30.0* 30.8* 30.1* 31.1*  MCV 92.6  --  94.5 93.8 92.8  PLT 368  --  370 409* 366   Basic Metabolic Panel: Recent Labs  Lab 05/29/18 0030 05/29/18 0707 05/30/18 0449 05/31/18 0440 06/01/18 0459 06/01/18 1657 06/02/18 0405  NA  --  141 143 143 141  140  --  137  K  --  3.3* 4.1 3.6 3.5  3.5  --  3.6  CL  --  105 108 109 108  107  --  102  CO2  --  25 25 26 26  27   --  23  GLUCOSE  --  108* 94 118* 109*  110*  --  93  BUN  --  15 24* 26* 22  21  --  14  CREATININE  --  0.85 1.31* 1.10* 1.00  0.98 0.99 1.07*  CALCIUM  --  9.0 8.7* 8.8* 8.9  8.9  --  8.6*  MG 1.7  --  1.7  --   --   --   --   PHOS  --   --   --   --  2.7  --   --    GFR: Estimated Creatinine Clearance: 40.3 mL/min (A) (by C-G formula based on SCr of 1.07 mg/dL (H)). Liver  Function Tests: Recent Labs  Lab 05/28/18 1613 06/01/18 0459  AST 23  --   ALT 20  --   ALKPHOS 81  --   BILITOT 0.6  --   PROT 6.8  --   ALBUMIN 3.3* 3.0*   No results for input(s): LIPASE, AMYLASE in the last 168 hours. No results for input(s): AMMONIA in the last 168 hours. Coagulation Profile: Recent Labs  Lab 06/01/18 0459  INR 1.16   Cardiac Enzymes: Recent Labs  Lab 05/29/18 0030 05/29/18 0707 05/29/18 1128  TROPONINI 0.06* 0.05* 0.06*   BNP (last 3 results) No results for input(s): PROBNP in the last 8760 hours. HbA1C: No results for input(s): HGBA1C in the last 72 hours. CBG: Recent Labs  Lab 06/01/18 1817 06/01/18 2137 06/02/18 0742 06/02/18 1128 06/02/18 1622  GLUCAP 133* 115* 92 147* 134*   Lipid Profile: No results for input(s): CHOL, HDL, LDLCALC, TRIG, CHOLHDL, LDLDIRECT in the last 72 hours. Thyroid Function Tests: No results for input(s): TSH, T4TOTAL, FREET4, T3FREE, THYROIDAB in the last 72 hours. Anemia Panel: No results for  input(s): VITAMINB12, FOLATE, FERRITIN, TIBC, IRON, RETICCTPCT in the last 72 hours. Sepsis Labs: Recent Labs  Lab 05/28/18 1721  LATICACIDVEN 0.84    Recent Results (from the past 240 hour(s))  Urine culture     Status: None   Collection Time: 05/28/18  4:14 PM  Result Value Ref Range Status   Specimen Description   Final    URINE, CLEAN CATCH Performed at [facility], [address] [place], [place] [address]    Special Requests   Final    NONE Performed at [name], [address], [place], [place] [address]    Culture   Final    NO GROWTH Performed at [facility], [address] [place], [place] [address]    Report Status 05/29/2018 FINAL  Final  Blood culture (routine x 2)     Status: None (Preliminary result)   Collection Time: 05/28/18  4:17 PM  Result Value Ref Range Status   Specimen Description   Final    BLOOD RIGHT ANTECUBITAL Performed at [facility], [address], [place], [place] [address]    Special Requests   Final    BOTTLES DRAWN AEROBIC AND ANAEROBIC Blood Culture adequate volume Performed at [facility], [address], [place], [place] [address]    Culture   Final    NO GROWTH 4 DAYS Performed at [facility], [address] [place], [place] [address]    Report Status PENDING  Incomplete  Blood culture (routine x 2)     Status: None (Preliminary result)   Collection Time: 05/28/18  4:22 PM  Result Value Ref Range Status   Specimen Description   Final    BLOOD RIGHT ANTECUBITAL Performed at [facility], [address], [place], [place] [address]    Special Requests   Final    BOTTLES DRAWN AEROBIC AND ANAEROBIC Blood Culture results may not be optimal due to an inadequate volume of blood received in culture bottles Performed at [facility], [address], [place], [place] [address]    Culture   Final     NO GROWTH 4 DAYS Performed at [facility], [address], [place], [place] [address]    Report Status PENDING  Incomplete         Radiology Studies: Dg Chest  Port 1 View  Result Date: 06/01/2018 CLINICAL DATA:  Recent pneumonia EXAM: PORTABLE CHEST 1 VIEW COMPARISON:  Chest radiograph and chest CT May 28, 2018 FINDINGS: There has been interval clearing of infiltrate from the left upper lobe. Mild atelectatic change remains in this area. There is no new opacity elsewhere. There is cardiomegaly with mild pulmonary venous hypertension. No adenopathy. There is advanced arthropathy in both shoulders. There is postoperative change in the right shoulder region. IMPRESSION: Interval clearing of infiltrate from left upper lobe. Mild atelectasis remains in this area. No new opacity. There is a degree of underlying pulmonary vascular congestion. There is advanced arthropathy in both shoulders with evidence suggesting avascular necrosis in each humeral head. Note that the nodular opacity in the right upper lobe seen on recent chest CT is not appreciable by radiography. Please see recommendations with respect to recent chest CT report. Electronically Signed   By: Bretta Bang III [name]   On: 06/01/2018 07:09        Scheduled Meds: . allopurinol  200 mg Oral Daily  . aspirin EC  81 mg Oral Daily  . azithromycin  500 mg Oral QHS  . calcium-vitamin D  1 tablet Oral Daily  . carvedilol  12.5 mg Oral BID WC  . diclofenac sodium  1 application Topical Daily  . ferrous sulfate  325 mg Oral BID WC  . gabapentin  100 mg Oral QHS  . guaiFENesin  1,200 mg Oral BID  . heparin  5,000 Units Subcutaneous Q8H  . hydrALAZINE  100 mg Oral TID  . hydroxychloroquine  200 mg Oral BID  . insulin aspart  0-5 Units Subcutaneous QHS  . [START ON 06/03/2018] isosorbide mononitrate  30 mg Oral Daily  . [START ON 06/03/2018] losartan  100 mg Oral Daily  . potassium chloride SA  20 mEq Oral Daily  . predniSONE   10 mg Oral Q breakfast  . sodium chloride flush  3 mL Intravenous Q12H  . sodium chloride flush  3 mL Intravenous Q12H  . triamcinolone cream  1 application Topical BID  . vitamin C  500 mg Oral QODAY   Continuous Infusions: . sodium chloride    . sodium chloride    . cefTRIAXone (ROCEPHIN)  IV 2 g (06/02/18 1638)     LOS: 5 days     Barnetta Chapel, MD [facility] 06/02/2018, 8:25 PM Pager: [phone]  If 7PM-7AM, please contact night-coverage [email or web address] 06/02/2018, 8:25 PM

## 2018-06-02 NOTE — Research (Signed)
RADPH Informed Consent   Subject Name: [name] [name]  Subject met inclusion and exclusion criteria.  The informed consent form, study requirements and expectations were reviewed with the subject and questions and concerns were addressed prior to the signing of the consent form.  The subject verbalized understanding of the trail requirements.  The subject agreed to participate in the RADPH trial and signed the informed consent.  The informed consent was obtained prior to performance of any protocol-specific procedures for the subject.  A copy of the signed informed consent was given to the subject and a copy was placed in the subject's medical record.   S  06/02/2018, 4:34 PM  

## 2018-06-02 NOTE — Progress Notes (Addendum)
Progress Note  Patient Name: [name] [name] Date of Encounter: 06/02/2018  Primary Cardiologist: Chilton Si, MD   Subjective   Cath yesterday showed mild nonobstructive CAD with moderately elevated LVEDP and moderate pulmonary HTN.  CO 5.68/CI 2.97.  Denies any chest pain or SOB.  Inpatient Medications    Scheduled Meds: . allopurinol  200 mg Oral Daily  . aspirin EC  81 mg Oral Daily  . calcium-vitamin D  1 tablet Oral Daily  . carvedilol  12.5 mg Oral BID WC  . diclofenac sodium  1 application Topical Daily  . ferrous sulfate  325 mg Oral BID WC  . gabapentin  100 mg Oral QHS  . guaiFENesin  1,200 mg Oral BID  . heparin  5,000 Units Subcutaneous Q8H  . hydrALAZINE  100 mg Oral TID  . hydroxychloroquine  200 mg Oral BID  . insulin aspart  0-5 Units Subcutaneous QHS  . isosorbide mononitrate  15 mg Oral Daily  . lisinopril  30 mg Oral Daily  . potassium chloride SA  20 mEq Oral Daily  . predniSONE  10 mg Oral Q breakfast  . sodium chloride flush  3 mL Intravenous Q12H  . sodium chloride flush  3 mL Intravenous Q12H  . triamcinolone cream  1 application Topical BID  . vitamin C  500 mg Oral QODAY   Continuous Infusions: . sodium chloride    . sodium chloride    . azithromycin 250 mL/hr at 06/01/18 2200  . cefTRIAXone (ROCEPHIN)  IV Stopped (06/01/18 1910)   PRN Meds: sodium chloride, sodium chloride, acetaminophen, albuterol, ondansetron (ZOFRAN) IV, polyvinyl alcohol, sodium chloride flush, sodium chloride flush, traMADol   Vital Signs    Vitals:   06/01/18 2146 06/01/18 2344 06/02/18 0500 06/02/18 0822  BP: (!) 158/78 (!) 162/80 (!) 164/98 (!) 165/89  Pulse:  89 96 95  Resp:  (!) 25 (!) 25   Temp:  98.6 F (37 C) 98 F (36.7 C)   TempSrc:  Oral Oral   SpO2:  100% 96%   Weight:   192 lb 7.4 oz (87.3 kg)   Height:        Intake/Output Summary (Last 24 hours) at 06/02/2018 0915 Last data filed at 06/02/2018 0034 Gross per 24 hour  Intake 773.3 ml    Output 1100 ml  Net -326.7 ml   Filed Weights   06/01/18 0611 06/01/18 1500 06/02/18 0500  Weight: 190 lb 0.6 oz (86.2 kg) 193 lb 9.6 oz (87.8 kg) 192 lb 7.4 oz (87.3 kg)    Telemetry    NSR - Personally Reviewed  ECG    No no EKG to review - Personally Reviewed  Physical Exam   GEN: No acute distress.   Neck: No JVD Cardiac: RRR, no murmurs, rubs, or gallops.  Respiratory: Clear to auscultation bilaterally. GI: Soft, nontender, non-distended  MS: No edema; No deformity. Neuro:  Nonfocal  Psych: Normal affect   Labs    Chemistry Recent Labs  Lab 05/28/18 1613  05/31/18 0440 06/01/18 0459 06/01/18 1657 06/02/18 0405  NA 141   < > 143 141  140  --  137  K 4.1   < > 3.6 3.5  3.5  --  3.6  CL 108   < > 109 108  107  --  102  CO2 24   < > 26 26  27   --  23  GLUCOSE 154*   < > 118* 109*  110*  --  93  BUN 17   < > 26* 22  21  --  14  CREATININE 0.83   < > 1.10* 1.00  0.98 0.99 1.07*  CALCIUM 9.1   < > 8.8* 8.9  8.9  --  8.6*  PROT 6.8  --   --   --   --   --   ALBUMIN 3.3*  --   --  3.0*  --   --   AST 23  --   --   --   --   --   ALT 20  --   --   --   --   --   ALKPHOS 81  --   --   --   --   --   BILITOT 0.6  --   --   --   --   --   GFRNONAA >60   < > 44* 50*  51* 50* 46*  GFRAA >60   < > 51* 57*  59* 58* 53*  ANIONGAP 9   < > 8 7  6   --  12   < > = values in this interval not displayed.     Hematology Recent Labs  Lab 05/30/18 0449 06/01/18 0459 06/01/18 1657  WBC 11.3* 10.6* 10.0  RBC 3.26* 3.21* 3.35*  HGB 9.8* 9.7* 10.0*  HCT 30.8* 30.1* 31.1*  MCV 94.5 93.8 92.8  MCH 30.1 30.2 29.9  MCHC 31.8 32.2 32.2  RDW 17.7* 17.2* 16.8*  PLT 370 409* 366    Cardiac Enzymes Recent Labs  Lab 05/29/18 0030 05/29/18 0707 05/29/18 1128  TROPONINI 0.06* 0.05* 0.06*    Recent Labs  Lab 05/28/18 1622  TROPIPOC 0.04     BNP Recent Labs  Lab 05/28/18 1614  BNP 2,077.6*     DDimer No results for input(s): DDIMER in the last 168  hours.   Radiology    Dg Chest Port 1 View  Result Date: 06/01/2018 CLINICAL DATA:  Recent pneumonia EXAM: PORTABLE CHEST 1 VIEW COMPARISON:  Chest radiograph and chest CT May 28, 2018 FINDINGS: There has been interval clearing of infiltrate from the left upper lobe. Mild atelectatic change remains in this area. There is no new opacity elsewhere. There is cardiomegaly with mild pulmonary venous hypertension. No adenopathy. There is advanced arthropathy in both shoulders. There is postoperative change in the right shoulder region. IMPRESSION: Interval clearing of infiltrate from left upper lobe. Mild atelectasis remains in this area. No new opacity. There is a degree of underlying pulmonary vascular congestion. There is advanced arthropathy in both shoulders with evidence suggesting avascular necrosis in each humeral head. Note that the nodular opacity in the right upper lobe seen on recent chest CT is not appreciable by radiography. Please see recommendations with respect to recent chest CT report. Electronically Signed   By: Bretta Bang III [name]   On: 06/01/2018 07:09    Cardiac Studies   2D echo 05/29/2018 Study Conclusions  - Left ventricle: The cavity size was normal. Wall thickness was increased in a pattern of moderate LVH. Systolic function was severely reduced. The estimated ejection fraction was in the range of 20% to 25%. Although no diagnostic regional wall motion abnormality was identified, this possibility cannot be completely excluded on the basis of this study. - Aortic valve: Mildly calcified annulus. Trileaflet; normal thickness, moderately calcified leaflets. There was mild regurgitation. - Mitral valve: Mildly calcified annulus. Mildly calcified leaflets . There was mild to moderate regurgitation. -  Left atrium: The atrium was moderately to severely dilated. - Right ventricle: The cavity size was mildly dilated. Wall thickness was normal.  Systolic function was mildly reduced. - Right atrium: The atrium was moderately dilated. - Atrial septum: There was a small patent foramen ovale. There was a left-to-right shunt. - Tricuspid valve: There was moderate regurgitation. - Pulmonary arteries: Systolic pressure was moderately increased. PA peak pressure: 47 mm Hg (S). - Pericardium, extracardiac: A trivial pericardial effusion was identified.   Patient Profile     84 y.o. female with h/o HTN, Rheumatoid Arthritis, diabetes type 2, CAD s/premoteMI, CVA, PVDand chronic diastolic heart failure, admitted for acute CHF and CAP with echo showing new systolic HF, for which cardiology has been consulted, at the request of Dr. Mahala Menghini, Internal Medicine.Pt also endorsed recent vague chest discomfort and found to have mildly elevated troponin with flat trend, 0.06x 3.   Assessment & Plan    1. Dilated Cardiomyopathy - nonischemic by cath -EF has declined to 20 to 25% since 2D echocardiogram in 2015 which showed normal LV function at 60 to 65%. ? Etiology.  -cath showed mild nonobstructive CAD -LVEDP increased at cath -creatinine stable at 1.07 -add lasix 40mg  daily -continue medical therapy with carvedilol 12.5mg  BID, hydralazine 100mg  TID and Imdur 30mg  daily. -change Lisinopril to Losartan 100mg  daily to make transition to [facility] easier at discharge  2. Chest Pain and Mildly Elevated Troponin (0.06 x 3): currently CP free.  -heart cath with mild nonobstructive CAD -continue long acting nitrates, ASA and BB -LDL 92 so add Lipitor 20mg  daily -will need FLP and ALT in 6 weeks  3. CAP: antibiotics per IM. She is afebrile.   4. HTN -BP still elevated at 165/8mmHg -continue carvedilol 12.5mg  BID, Hydralazine 100mg  TID, Imdur 30mg  daily -changing Lisinopril to Losartan 100mg  daily starting tomorrow to make transition to [facility] easier at discharge  5. Small PFO: noted on echo, Lt>>Rt shunt    I have spent a total  of 35 minutes with patient reviewing cardiac cath , telemetry, EKGs, labs and examining patient as well as establishing an assessment and plan that was discussed with the patient.  > 50% of time was spent in direct patient care.     For questions or updates, please contact [facility] Please consult [email or web address] for contact info under Cardiology/STEMI.      Signed, , MD  06/02/2018, 9:15 AM

## 2018-06-02 NOTE — Progress Notes (Signed)
PT Cancellation Note  Patient Details Name: [name] [name] MRN: [id number] DOB: Jul 18, 1930   Cancelled Treatment:    Reason Eval/Treat Not Completed: Medical issues which prohibited therapy Pt with elevated BP (180/108 mmHg, and 173/107 mmHg) in supine. RN in room to administer BP meds. Will follow up as pt medically appropriate and as schedule allows.   Gladys Damme, PT, DPT  Acute Rehabilitation Services  Pager: [phone]  [name] 06/02/2018, 4:43 PM

## 2018-06-02 NOTE — Consult Note (Signed)
   [facility] CM Inpatient Consult   06/02/2018  [name] [name] [birth year] [id number]  Patient screened for post hospital transitional care needs.  [name] chart review reveals that the patient is being recommended for skilled nursing for short term rehab.  No current needs noted at this time.  Will follow up with inpatient care management.   Please place a [facility] Care Management consult or for questions contact:   Charlesetta Shanks, RN BSN CCM [facility]  [phone] business mobile phone Toll free office [phone]

## 2018-06-03 LAB — CULTURE, BLOOD (ROUTINE X 2)
Culture: NO GROWTH
Culture: NO GROWTH
Special Requests: ADEQUATE

## 2018-06-03 LAB — BASIC METABOLIC PANEL
Anion gap: 9 (ref 5–15)
BUN: 16 mg/dL (ref 8–23)
CO2: 24 mmol/L (ref 22–32)
Calcium: 8.9 mg/dL (ref 8.9–10.3)
Chloride: 102 mmol/L (ref 98–111)
Creatinine, Ser: 1.09 mg/dL — ABNORMAL HIGH (ref 0.44–1.00)
GFR calc Af Amer: 52 mL/min — ABNORMAL LOW (ref >60)
GFR calc non Af Amer: 45 mL/min — ABNORMAL LOW (ref >60)
Glucose, Bld: 136 mg/dL — ABNORMAL HIGH (ref 70–99)
Potassium: 3.7 mmol/L (ref 3.5–5.1)
Sodium: 135 mmol/L (ref 135–145)

## 2018-06-03 LAB — GLUCOSE, CAPILLARY
Glucose-Capillary: 94 mg/dL (ref 70–99)
Glucose-Capillary: 166 mg/dL — ABNORMAL HIGH (ref 70–99)
Glucose-Capillary: 127 mg/dL — ABNORMAL HIGH (ref 70–99)
Glucose-Capillary: 152 mg/dL — ABNORMAL HIGH (ref 70–99)

## 2018-06-03 MED ORDER — CARVEDILOL 25 MG PO TABS
25.0000 mg | ORAL_TABLET | Freq: Two times a day (BID) | ORAL | Status: DC
  Administered 2018-06-03 – 2018-06-04 (×3): 25 mg via ORAL
  Filled 2018-06-03 (×3): qty 1

## 2018-06-03 MED ORDER — SPIRONOLACTONE 12.5 MG HALF TABLET
12.5000 mg | ORAL_TABLET | Freq: Every day | ORAL | Status: DC
  Administered 2018-06-03 – 2018-06-04 (×2): 12.5 mg via ORAL
  Filled 2018-06-03 (×2): qty 1

## 2018-06-03 MED ORDER — CARVEDILOL 25 MG PO TABS: 25.0000 mg | ORAL_TABLET | Freq: Two times a day (BID) | ORAL | Status: DC

## 2018-06-03 MED ORDER — FUROSEMIDE 10 MG/ML IJ SOLN
40.0000 mg | Freq: Once | INTRAMUSCULAR | Status: AC
  Administered 2018-06-03: 40 mg via INTRAVENOUS
  Filled 2018-06-03: qty 4

## 2018-06-03 MED ORDER — CARVEDILOL 12.5 MG PO TABS
12.5000 mg | ORAL_TABLET | Freq: Once | ORAL | Status: AC
  Administered 2018-06-03: 12.5 mg via ORAL
  Filled 2018-06-03: qty 1

## 2018-06-03 MED ORDER — FUROSEMIDE 40 MG PO TABS
40.0000 mg | ORAL_TABLET | Freq: Every day | ORAL | Status: DC
  Administered 2018-06-04: 40 mg via ORAL
  Filled 2018-06-03: qty 1

## 2018-06-03 NOTE — Care Management Note (Signed)
Case Management Note  Patient Details  Name: [name] [name] MRN: [id number] Date of Birth: 08-25-1930  Subjective/Objective:  Pt presented for CHF- pt received dose of IV Lasix today. PT consulted/ recommendations for SNF.                 Action/Plan: CSW assisting for SNF- CM will continue to monitor for additional needs.   Expected Discharge Date:                  Expected Discharge Plan:  Skilled Nursing Facility  In-House Referral:  Clinical Social Work  Discharge planning Services  CM Consult  Post Acute Care Choice:  NA Choice offered to:  NA  DME Arranged:  N/A DME Agency:  NA  HH Arranged:  NA HH Agency:  NA  Status of Service:  Completed, signed off  If discussed at Long Length of Stay Meetings, dates discussed:    Additional Comments:  Gala Lewandowsky, RN 06/03/2018, 4:06 PM

## 2018-06-03 NOTE — Progress Notes (Signed)
Progress Note  Patient Name: ELMINA HENDEL Date of Encounter: 06/03/2018  Primary Cardiologist: Chilton Si, MD   Subjective   Cath 07/25 w/ mild nonobstructive CAD with moderately elevated LVEDP and moderate pulmonary HTN.  CO 5.68/CI 2.97.    07/27, pt denies CP or SOB. However, her respiratory rate increases w/ conversation.   Inpatient Medications    Scheduled Meds: . allopurinol  200 mg Oral Daily  . aspirin EC  81 mg Oral Daily  . azithromycin  500 mg Oral QHS  . calcium-vitamin D  1 tablet Oral Daily  . carvedilol  12.5 mg Oral BID WC  . diclofenac sodium  1 application Topical Daily  . ferrous sulfate  325 mg Oral BID WC  . gabapentin  100 mg Oral QHS  . guaiFENesin  1,200 mg Oral BID  . heparin  5,000 Units Subcutaneous Q8H  . hydrALAZINE  100 mg Oral TID  . hydroxychloroquine  200 mg Oral BID  . insulin aspart  0-5 Units Subcutaneous QHS  . isosorbide mononitrate  30 mg Oral Daily  . losartan  100 mg Oral Daily  . potassium chloride SA  20 mEq Oral Daily  . predniSONE  10 mg Oral Q breakfast  . sodium chloride flush  3 mL Intravenous Q12H  . sodium chloride flush  3 mL Intravenous Q12H  . triamcinolone cream  1 application Topical BID  . vitamin C  500 mg Oral QODAY   Continuous Infusions: . sodium chloride    . sodium chloride    . cefTRIAXone (ROCEPHIN)  IV 2 g (06/02/18 1638)   PRN Meds: sodium chloride, sodium chloride, acetaminophen, albuterol, ondansetron (ZOFRAN) IV, polyvinyl alcohol, sodium chloride flush, sodium chloride flush, traMADol   Vital Signs    Vitals:   06/03/18 0546 06/03/18 0600 06/03/18 0824 06/03/18 0826  BP: (!) 174/95 (!) 166/96 (!) 164/116   Pulse: 90   89  Resp: 20 18    Temp: 97.6 F (36.4 C)     TempSrc: Oral     SpO2: 99%     Weight: 184 lb 9.6 oz (83.7 kg)     Height:        Intake/Output Summary (Last 24 hours) at 06/03/2018 0828 Last data filed at 06/03/2018 0739 Gross per 24 hour  Intake 1080 ml    Output -  Net 1080 ml   Filed Weights   06/01/18 1500 06/02/18 0500 06/03/18 0546  Weight: 193 lb 9.6 oz (87.8 kg) 192 lb 7.4 oz (87.3 kg) 184 lb 9.6 oz (83.7 kg)    Telemetry    SR, frequent PVCs and pairs - Personally Reviewed  ECG    No no EKG to review - Personally Reviewed  Physical Exam   GEN: No acute distress.   Neck: JVD 10 cm Cardiac: RRR, no murmurs, rubs, or gallops.  Respiratory: decreased BS bases bilaterally. GI: Soft, nontender, non-distended, +HJR MS: No edema; No deformity. Neuro:  Nonfocal  Psych: Normal affect   Labs    Chemistry Recent Labs  Lab 05/28/18 1613  05/31/18 0440 06/01/18 0459 06/01/18 1657 06/02/18 0405  NA 141   < > 143 141  140  --  137  K 4.1   < > 3.6 3.5  3.5  --  3.6  CL 108   < > 109 108  107  --  102  CO2 24   < > 26 26  27   --  23  GLUCOSE 154*   < >  118* 109*  110*  --  93  BUN 17   < > 26* 22  21  --  14  CREATININE 0.83   < > 1.10* 1.00  0.98 0.99 1.07*  CALCIUM 9.1   < > 8.8* 8.9  8.9  --  8.6*  PROT 6.8  --   --   --   --   --   ALBUMIN 3.3*  --   --  3.0*  --   --   AST 23  --   --   --   --   --   ALT 20  --   --   --   --   --   ALKPHOS 81  --   --   --   --   --   BILITOT 0.6  --   --   --   --   --   GFRNONAA >60   < > 44* 50*  51* 50* 46*  GFRAA >60   < > 51* 57*  59* 58* 53*  ANIONGAP 9   < > 8 7  6   --  12   < > = values in this interval not displayed.     Hematology Recent Labs  Lab 05/30/18 0449 06/01/18 0459 06/01/18 1657  WBC 11.3* 10.6* 10.0  RBC 3.26* 3.21* 3.35*  HGB 9.8* 9.7* 10.0*  HCT 30.8* 30.1* 31.1*  MCV 94.5 93.8 92.8  MCH 30.1 30.2 29.9  MCHC 31.8 32.2 32.2  RDW 17.7* 17.2* 16.8*  PLT 370 409* 366    Cardiac Enzymes Recent Labs  Lab 05/29/18 0030 05/29/18 0707 05/29/18 1128  TROPONINI 0.06* 0.05* 0.06*    Recent Labs  Lab 05/28/18 1622  TROPIPOC 0.04     BNP Recent Labs  Lab 05/28/18 1614  BNP 2,077.6*     DDimer No results for input(s):  DDIMER in the last 168 hours.   Radiology    No results found.  Cardiac Studies   CATH: 06/01/2018  LV end diastolic pressure is moderately elevated.  There is no aortic valve stenosis.  Hemodynamic findings consistent with moderate pulmonary hypertension.  CO 5.68 L/min; CI 2.97; Ao sat 99%; PA sat 65%; mean PA 42 mm Hg; mean PCWP 28 mm Hg  Nonobstructive CAD.   Nonischemic cardiomyopathy with mild to moderate CAD.   2D echo 05/29/2018 Study Conclusions  - Left ventricle: The cavity size was normal. Wall thickness was increased in a pattern of moderate LVH. Systolic function was severely reduced. The estimated ejection fraction was in the range of 20% to 25%. Although no diagnostic regional wall motion abnormality was identified, this possibility cannot be completely excluded on the basis of this study. - Aortic valve: Mildly calcified annulus. Trileaflet; normal thickness, moderately calcified leaflets. There was mild regurgitation. - Mitral valve: Mildly calcified annulus. Mildly calcified leaflets . There was mild to moderate regurgitation. - Left atrium: The atrium was moderately to severely dilated. - Right ventricle: The cavity size was mildly dilated. Wall thickness was normal. Systolic function was mildly reduced. - Right atrium: The atrium was moderately dilated. - Atrial septum: There was a small patent foramen ovale. There was a left-to-right shunt. - Tricuspid valve: There was moderate regurgitation. - Pulmonary arteries: Systolic pressure was moderately increased. PA peak pressure: 47 mm Hg (S). - Pericardium, extracardiac: A trivial pericardial effusion was identified.   Patient Profile     84 y.o. female with h/o HTN, Rheumatoid Arthritis, diabetes type  2, CAD s/premoteMI, CVA, PVDand chronic diastolic heart failure, admitted for acute CHF and CAP with echo showing new systolic HF, for which cardiology has been consulted,  at the request of Dr. Mahala Menghini, Internal Medicine.Pt also endorsed recent vague chest discomfort and found to have mildly elevated troponin with flat trend, 0.06x 3.   Assessment & Plan    1. Dilated Cardiomyopathy - nonischemic by cath -EF has declined to 20 to 25%, NL LV function 2015, 60- 65%. Etiology unknown -cath showed mild nonobstructive CAD -LVEDP increased at cath, volume up by exam>>give Lasix IV today -creatinine 1.07 07/25, recheck today - Last Lasix dose was 40 mg IV 07/24, give IV Lasix 40 mg x 1 -continue carvedilol 12.5mg  BID, hydralazine 100mg  TID and Imdur 30mg  daily. -last dose Lisinopril 07/25, to start Losartan 100mg  daily today -- transition to [facility] at/by discharge  2. Chest Pain and Mildly Elevated Troponin (0.06 x 3): currently CP free.  -heart cath with mild nonobstructive CAD -continue long acting nitrates, ASA and BB -LDL 92 so add Lipitor 20mg  daily -will need FLP and ALT in 6 weeks  3. CAP: antibiotics per IM. She is afebrile.   4. HTN -SBP generally elevated 140s-160s but one reading was 98 -continue carvedilol 12.5mg  BID, Hydralazine 100mg  TID, Imdur 30mg  daily, no doses missed -changing Lisinopril to Losartan 100mg  daily starting 07/26  -- transition to [facility] at/by discharge  5. Small PFO: noted on echo, Lt>>Rt shunt -- MD advise if pre-procedure ABX needed    I have spent >35 minutes with patient reviewing cardiac cath , telemetry, EKGs, labs and examining patient as well as establishing an assessment and plan that was discussed with the patient.  > 50% of time was spent in direct patient care.     For questions or updates, please contact [facility] Please consult [email or web address] for contact info under Cardiology/STEMI.      Signed, [facility], PA-C  06/03/2018, 8:28 AM

## 2018-06-03 NOTE — Clinical Social Work Note (Signed)
Clinical Social Work Assessment  Patient Details  Name: [name] [name] MRN: [id number] Date of Birth: [birth year]  Date of referral:  06/02/18               Reason for consult:  Facility Placement                Permission sought to share information with:  Facility Medical sales representative, Family Supports Permission granted to share information::  Yes, Verbal Permission Granted  Name::     Museum/gallery exhibitions officer::  SNF  Relationship::  Daughter  Contact Information:     Housing/Transportation Living arrangements for the past 2 months:  Single Family Home Source of Information:  Adult Children Patient Interpreter Needed:  None Criminal Activity/Legal Involvement Pertinent to Current Situation/Hospitalization:  No - Comment as needed Significant Relationships:  Adult Children Lives with:  Self Do you feel safe going back to the place where you live?  Yes Need for family participation in patient care:  Yes (Comment)  Care giving concerns:  Patient from home alone with part time caregiver, but will benefit from short term rehab at discharge.    Social Worker assessment / plan:  CSW spoke with patient's daughter over the phone to discuss SNF placement. CSW confirmed bed offer at [facility]. CSW to follow.  Employment status:  Retired Database administrator PT Recommendations:  Skilled Nursing Facility Information / Referral to community resources:  Skilled Nursing Facility  Patient/Family's Response to care:  [name] agreeable to SNF placement.  Patient/Family's Understanding of and Emotional Response to Diagnosis, Current Treatment, and Prognosis:  [name] discussed how this was the patient's first time looking into SNF placement so the family wasn't sure what they needed to do. [name] would like placement at [facility], and is very appreciative of CSW assistance.  Emotional Assessment Appearance:  Appears stated  age Attitude/Demeanor/Rapport:  Unable to Assess Affect (typically observed):  Unable to Assess Orientation:  Oriented to Self Alcohol / Substance use:  Not Applicable Psych involvement (Current and /or in the community):  No (Comment)  Discharge Needs  Concerns to be addressed:  Care Coordination Readmission within the last 30 days:  No Current discharge risk:  Dependent with Mobility, Lives alone Barriers to Discharge:  Continued Medical Work up, Insurance Authorization   [name], [place] 06/03/2018, 8:48 AM

## 2018-06-03 NOTE — Care Management Important Message (Signed)
Important Message  Patient Details  Name: [name] [name] MRN: [id number] Date of Birth: 1930-04-10   Medicare Important Message Given:  Yes    [name] P [name] 06/03/2018, 3:37 PM

## 2018-06-03 NOTE — Progress Notes (Signed)
Physical Therapy Treatment Patient Details Name: [name] [name] MRN: [id number] DOB: 1930-04-26 Today's Date: 06/03/2018    History of Present Illness 84 yo female admitted with CHF. Hx of RA, OA, gout, CAD, CVA, DM.     PT Comments    Patient progressing well with therapy this visit, ambulating further distances before requiring rest break. VSS on RA. Pt had been up and forth to bathroom several times prior to visit so may have been fatigued as well. Cont to rec to SNF due to decreased activity tolerance, weakness and balance.    Follow Up Recommendations  SNF     Equipment Recommendations  None recommended by PT    Recommendations for Other Services       Precautions / Restrictions Precautions Precautions: Fall Restrictions Weight Bearing Restrictions: No    Mobility  Bed Mobility               General bed mobility comments: OOB at entry  Transfers Overall transfer level: Needs assistance Equipment used: Rolling walker (2 wheeled) Transfers: Sit to/from Stand Sit to Stand: Min guard            Ambulation/Gait Ambulation/Gait assistance: Min guard Gait Distance (Feet): 65 Feet Assistive device: Rolling walker (2 wheeled) Gait Pattern/deviations: Step-through pattern;Decreased stride length Gait velocity: decreased   General Gait Details: pt ambulating becoming DOE 2/4, WNL sats, HR max 108 during ambulation. requests to sit down due to fatigue    Stairs             Wheelchair Mobility    Modified Rankin (Stroke Patients Only)       Balance Overall balance assessment: Needs assistance   Sitting balance-Leahy Scale: Good       Standing balance-Leahy Scale: Poor                              Cognition Arousal/Alertness: Awake/alert Behavior During Therapy: WFL for tasks assessed/performed Overall Cognitive Status: Within Functional Limits for tasks assessed                                         Exercises      General Comments        Pertinent Vitals/Pain Pain Assessment: No/denies pain    Home Living                      Prior Function            PT Goals (current goals can now be found in the care plan section) Acute Rehab PT Goals Patient Stated Goal: to walk further PT Goal Formulation: With patient Time For Goal Achievement: 06/14/18 Potential to Achieve Goals: Good Progress towards PT goals: Progressing toward goals    Frequency    Min 3X/week      PT Plan Current plan remains appropriate    Co-evaluation              AM-PAC PT "6 Clicks" Daily Activity  Outcome Measure  Difficulty turning over in bed (including adjusting bedclothes, sheets and blankets)?: A Lot Difficulty moving from lying on back to sitting on the side of the bed? : Unable Difficulty sitting down on and standing up from a chair with arms (e.g., wheelchair, bedside commode, etc,.)?: Unable Help needed moving to and from a bed to chair (  including a wheelchair)?: A Little Help needed walking in hospital room?: A Little Help needed climbing 3-5 steps with a railing? : A Lot 6 Click Score: 12    End of Session Equipment Utilized During Treatment: Gait belt Activity Tolerance: Patient limited by fatigue Patient left: with call bell/phone within reach;in chair Nurse Communication: Mobility status PT Visit Diagnosis: Muscle weakness (generalized) (M62.81);Difficulty in walking, not elsewhere classified (R26.2)     Time: 2703-5009 PT Time Calculation (min) (ACUTE ONLY): 18 min  Charges:  $Gait Training: 8-22 mins                     Etta Grandchild, PT, DPT Acute Rehab Services Pager: [phone]     Etta Grandchild 06/03/2018, 3:49 PM

## 2018-06-03 NOTE — Progress Notes (Signed)
PROGRESS NOTE    [name] [name]  CBS:[id number] DOB: [birth year] DOA: 05/28/2018 PCP: Helane Rima, DO   Brief Narrative: [name] [name] is a 84 y.o. female with a history of rheumatoid arthritis, diabetes mellitus, CKD stage 3, CAD, PAD, CVA. Patient presented secondary to weakness, found to have an acute heart failure exacerbation in addition to pneumonia. Currently on antibiotics. Concern for ischemic etiology of cardiomyopathy and patient underwent R/L cardiac cath without evidence of significant ischemia.  06/03/18: Patient seen. Patient continues to improve. Cardiology input is highly appreciated. Cardiology is managing patient's diuretics. For discharge once cleared for discharge by the Cardiology team.  Assessment & Plan:   Active Problems:   Congestive heart failure (CHF) (HCC)   Hypertensive urgency   Pneumonia of left upper lobe due to infectious organism (HCC)   Elevated brain natriuretic peptide (BNP) level   Elevated troponin   DCM (dilated cardiomyopathy) (HCC)   Acute systolic heart failure (HCC)   Acute on chronic systolic heart failure New EF of 20-25%, down from 60-65% 4 years prior. Grade 1 diastolic dysfunction. Cardiology consulted for management. Currently asymptomatic. Troponin mildly elevated with flat trend. Cardiac catheterization significant for mild-moderate CAD. Previously diuresed, which has now ben discontinued. -Cardiology recommendations -Continue Coreg -Pursue disposition once cleared for discharge by the Cardiology team (Diuretics and volume are being adjusted by the Cardiology team).  Essential hypertension On lisinopril as an outpatient which has been held secondary to AKI -Continue Imdur/Coreg -Restart lisinopril as AKI has resolved and blood pressure is uncontrolled -Continue to optimize.  Still uncontrolled  Acute metabolic encephalopathy Multifactorial per chart review. Resolved currently.  Left upper lobe pneumonia -Continue  Ceftriaxone/azithromycin -Complete course of antibiotics  Acute metabolic encephalopathy CT head significant for no acute process. Thought possibly secondary to medications. Resolved.  Acute kidney injury Baseline creatinine of 0.9. Peak of 1.31 which has now resolved.  Rheumatoid arthritis Patient on prednisone and methotrexate as an outpatient. Rheum called by prior hospitalist. Plan is to continue Plaquenil and allopurinol. On discharge, send home on prednisone only with rheumatology follow-up 1 week post-discharge for hospital follow-up. -Continue Plaquenil until discharge -Continue prednisone  Gout On allopurinol as an outpatient -Continue allopurinol  Anemia of chronic disease Stable. No evidence of acute bleeding.  Chronic pain -Continue Tramadol  Right upper lobe nodule Outpatient follow-up. 3 month repeat chest CT vs PET-CT vs tissue sampling  History of diabetes mellitus Currently well controlled with last hemoglobin A1C of 5.4%. On metformin as an outpatient. -Continue SSI   DVT prophylaxis: Heparin subq Code Status:   Code Status: Full Code Family Communication: None at bedside Disposition Plan: Discharge pending cardiology workup. PT recommending SNF.   Consultants:   Cardiology  Procedures:    Transthoracic Echocardiogram (7/21) Study Conclusions  - Left ventricle: The cavity size was normal. Wall thickness was   increased in a pattern of moderate LVH. Systolic function was   severely reduced. The estimated ejection fraction was in the   range of 20% to 25%. Although no diagnostic regional wall motion   abnormality was identified, this possibility cannot be completely   excluded on the basis of this study. - Aortic valve: Mildly calcified annulus. Trileaflet; normal   thickness, moderately calcified leaflets. There was mild   regurgitation. - Mitral valve: Mildly calcified annulus. Mildly calcified leaflets   . There was mild to moderate  regurgitation. - Left atrium: The atrium was moderately to severely dilated. - Right ventricle: The cavity size was mildly  dilated. Wall   thickness was normal. Systolic function was mildly reduced. - Right atrium: The atrium was moderately dilated. - Atrial septum: There was a small patent foramen ovale. There was   a left-to-right shunt. - Tricuspid valve: There was moderate regurgitation. - Pulmonary arteries: Systolic pressure was moderately increased.   PA peak pressure: 47 mm Hg (S). - Pericardium, extracardiac: A trivial pericardial effusion was   identified.  Cardiac cath (7/24)  LV end diastolic pressure is moderately elevated.  There is no aortic valve stenosis.  Hemodynamic findings consistent with moderate pulmonary hypertension.  CO 5.68 L/min; CI 2.97; Ao sat 99%; PA sat 65%; mean PA 42 mm Hg; mean PCWP 28 mm Hg  Nonobstructive CAD.   Nonischemic cardiomyopathy with mild to moderate CAD.   Antimicrobials:  Rocephin (7/20>>  Azithromycin (7/20>>    Subjective: No new complaints.   No fever or chills No chest pain,  No shortness of breath    Objective: Vitals:   06/03/18 0600 06/03/18 0824 06/03/18 0826 06/03/18 1412  BP: (!) 166/96 (!) 164/116  112/69  Pulse:   89 81  Resp: 18   (!) 22  Temp:    98.2 F (36.8 C)  TempSrc:    Oral  SpO2:    99%  Weight:      Height:        Intake/Output Summary (Last 24 hours) at 06/03/2018 1422 Last data filed at 06/03/2018 1134 Gross per 24 hour  Intake 960 ml  Output -  Net 960 ml   Filed Weights   06/01/18 1500 06/02/18 0500 06/03/18 0546  Weight: 87.8 kg (193 lb 9.6 oz) 87.3 kg (192 lb 7.4 oz) 83.7 kg (184 lb 9.6 oz)    Examination:  General exam: Appears calm and comfortable Respiratory system: Clear to auscultation. Respiratory effort normal. Cardiovascular system: S1 & S2. Gastrointestinal system: Abdomen is nondistended, soft and nontender. Decreased bowel sounds heard. Central nervous  system: Alert and oriented. No focal neurological deficits. Extremities: No edema. No calf tenderness   Data Reviewed: I have personally reviewed following labs and imaging studies  CBC: Recent Labs  Lab 05/28/18 1900 05/29/18 0030 05/30/18 0449 06/01/18 0459 06/01/18 1657  WBC 9.7  --  11.3* 10.6* 10.0  NEUTROABS 7.5  --   --  7.4  --   HGB 9.8*  --  9.8* 9.7* 10.0*  HCT 30.1* 30.0* 30.8* 30.1* 31.1*  MCV 92.6  --  94.5 93.8 92.8  PLT 368  --  370 409* 366   Basic Metabolic Panel: Recent Labs  Lab 05/29/18 0030  05/30/18 0449 05/31/18 0440 06/01/18 0459 06/01/18 1657 06/02/18 0405 06/03/18 0957  NA  --    < > 143 143 141  140  --  137 135  K  --    < > 4.1 3.6 3.5  3.5  --  3.6 3.7  CL  --    < > 108 109 108  107  --  102 102  CO2  --    < > 25 26 26  27   --  23 24  GLUCOSE  --    < > 94 118* 109*  110*  --  93 136*  BUN  --    < > 24* 26* 22  21  --  14 16  CREATININE  --    < > 1.31* 1.10* 1.00  0.98 0.99 1.07* 1.09*  CALCIUM  --    < > 8.7* 8.8*  8.9  8.9  --  8.6* 8.9  MG 1.7  --  1.7  --   --   --   --   --   PHOS  --   --   --   --  2.7  --   --   --    < > = values in this interval not displayed.   GFR: Estimated Creatinine Clearance: 38.8 mL/min (A) (by C-G formula based on SCr of 1.09 mg/dL (H)). Liver Function Tests: Recent Labs  Lab 05/28/18 1613 06/01/18 0459  AST 23  --   ALT 20  --   ALKPHOS 81  --   BILITOT 0.6  --   PROT 6.8  --   ALBUMIN 3.3* 3.0*   No results for input(s): LIPASE, AMYLASE in the last 168 hours. No results for input(s): AMMONIA in the last 168 hours. Coagulation Profile: Recent Labs  Lab 06/01/18 0459  INR 1.16   Cardiac Enzymes: Recent Labs  Lab 05/29/18 0030 05/29/18 0707 05/29/18 1128  TROPONINI 0.06* 0.05* 0.06*   BNP (last 3 results) No results for input(s): PROBNP in the last 8760 hours. HbA1C: No results for input(s): HGBA1C in the last 72 hours. CBG: Recent Labs  Lab 06/02/18 1128  06/02/18 1622 06/02/18 2046 06/03/18 0735 06/03/18 1125  GLUCAP 147* 134* 126* 94 166*   Lipid Profile: No results for input(s): CHOL, HDL, LDLCALC, TRIG, CHOLHDL, LDLDIRECT in the last 72 hours. Thyroid Function Tests: No results for input(s): TSH, T4TOTAL, FREET4, T3FREE, THYROIDAB in the last 72 hours. Anemia Panel: No results for input(s): VITAMINB12, FOLATE, FERRITIN, TIBC, IRON, RETICCTPCT in the last 72 hours. Sepsis Labs: Recent Labs  Lab 05/28/18 1721  LATICACIDVEN 0.84    Recent Results (from the past 240 hour(s))  Urine culture     Status: None   Collection Time: 05/28/18  4:14 PM  Result Value Ref Range Status   Specimen Description   Final    URINE, CLEAN CATCH Performed at [facility], [address], [place], [place] [address]    Special Requests   Final    NONE Performed at [facility], [address], [place], [place] [address]    Culture   Final    NO GROWTH Performed at [facility], [address] [place], [place] [address]    Report Status 05/29/2018 FINAL  Final  Blood culture (routine x 2)     Status: None   Collection Time: 05/28/18  4:17 PM  Result Value Ref Range Status   Specimen Description   Final    BLOOD RIGHT ANTECUBITAL Performed at [facility], [address], [place], [place] [address]    Special Requests   Final    BOTTLES DRAWN AEROBIC AND ANAEROBIC Blood Culture adequate volume Performed at [facility], [address] [place], [place] [address]    Culture   Final    NO GROWTH 5 DAYS Performed at [facility], [address], [place], [place] [address]    Report Status 06/03/2018 FINAL  Final  Blood culture (routine x 2)     Status: None   Collection Time: 05/28/18  4:22 PM  Result Value Ref Range Status   Specimen Description   Final    BLOOD RIGHT ANTECUBITAL Performed at [facility], [address]  [place], [place] [address]    Special Requests   Final    BOTTLES  DRAWN AEROBIC AND ANAEROBIC Blood Culture results may not be optimal due to an inadequate volume of blood received in culture bottles Performed at [facility], [address], [place], [place] [address]    Culture   Final    NO GROWTH 5 DAYS Performed at [facility], [address] [place], [place] [address]    Report Status 06/03/2018 FINAL  Final         Radiology Studies: No results found.      Scheduled Meds: . allopurinol  200 mg Oral Daily  . aspirin EC  81 mg Oral Daily  . azithromycin  500 mg Oral QHS  . calcium-vitamin D  1 tablet Oral Daily  . carvedilol  25 mg Oral BID WC  . diclofenac sodium  1 application Topical Daily  . ferrous sulfate  325 mg Oral BID WC  . [START ON 06/04/2018] furosemide  40 mg Oral Daily  . gabapentin  100 mg Oral QHS  . guaiFENesin  1,200 mg Oral BID  . heparin  5,000 Units Subcutaneous Q8H  . hydrALAZINE  100 mg Oral TID  . hydroxychloroquine  200 mg Oral BID  . insulin aspart  0-5 Units Subcutaneous QHS  . isosorbide mononitrate  30 mg Oral Daily  . losartan  100 mg Oral Daily  . potassium chloride SA  20 mEq Oral Daily  . predniSONE  10 mg Oral Q breakfast  . sodium chloride flush  3 mL Intravenous Q12H  . sodium chloride flush  3 mL Intravenous Q12H  . spironolactone  12.5 mg Oral Daily  . triamcinolone cream  1 application Topical BID  . vitamin C  500 mg Oral QODAY   Continuous Infusions: . sodium chloride    . sodium chloride    . cefTRIAXone (ROCEPHIN)  IV 2 g (06/02/18 1638)     LOS: 6 days     Barnetta Chapel, MD [facility] 06/03/2018, 2:22 PM Pager: [phone]  If 7PM-7AM, please contact night-coverage [email or web address] 06/03/2018, 2:22 PM

## 2018-06-04 ENCOUNTER — Other Ambulatory Visit: Payer: Self-pay | Admitting: Physician Assistant

## 2018-06-04 DIAGNOSIS — I5043 Acute on chronic combined systolic (congestive) and diastolic (congestive) heart failure: Secondary | ICD-10-CM | POA: Diagnosis not present

## 2018-06-04 DIAGNOSIS — G9341 Metabolic encephalopathy: Secondary | ICD-10-CM | POA: Diagnosis not present

## 2018-06-04 DIAGNOSIS — I130 Hypertensive heart and chronic kidney disease with heart failure and stage 1 through stage 4 chronic kidney disease, or unspecified chronic kidney disease: Secondary | ICD-10-CM | POA: Diagnosis not present

## 2018-06-04 DIAGNOSIS — R748 Abnormal levels of other serum enzymes: Secondary | ICD-10-CM | POA: Diagnosis not present

## 2018-06-04 DIAGNOSIS — I5033 Acute on chronic diastolic (congestive) heart failure: Secondary | ICD-10-CM | POA: Diagnosis not present

## 2018-06-04 DIAGNOSIS — I129 Hypertensive chronic kidney disease with stage 1 through stage 4 chronic kidney disease, or unspecified chronic kidney disease: Secondary | ICD-10-CM | POA: Diagnosis not present

## 2018-06-04 DIAGNOSIS — I2510 Atherosclerotic heart disease of native coronary artery without angina pectoris: Secondary | ICD-10-CM | POA: Diagnosis not present

## 2018-06-04 DIAGNOSIS — J168 Pneumonia due to other specified infectious organisms: Secondary | ICD-10-CM | POA: Diagnosis not present

## 2018-06-04 DIAGNOSIS — M059 Rheumatoid arthritis with rheumatoid factor, unspecified: Secondary | ICD-10-CM | POA: Diagnosis not present

## 2018-06-04 DIAGNOSIS — Z888 Allergy status to other drugs, medicaments and biological substances status: Secondary | ICD-10-CM | POA: Diagnosis not present

## 2018-06-04 DIAGNOSIS — I10 Essential (primary) hypertension: Secondary | ICD-10-CM | POA: Diagnosis not present

## 2018-06-04 DIAGNOSIS — N183 Chronic kidney disease, stage 3 (moderate): Secondary | ICD-10-CM | POA: Diagnosis not present

## 2018-06-04 DIAGNOSIS — Z885 Allergy status to narcotic agent status: Secondary | ICD-10-CM | POA: Diagnosis not present

## 2018-06-04 DIAGNOSIS — M2550 Pain in unspecified joint: Secondary | ICD-10-CM | POA: Diagnosis not present

## 2018-06-04 DIAGNOSIS — Z79891 Long term (current) use of opiate analgesic: Secondary | ICD-10-CM | POA: Diagnosis not present

## 2018-06-04 DIAGNOSIS — M069 Rheumatoid arthritis, unspecified: Secondary | ICD-10-CM | POA: Diagnosis not present

## 2018-06-04 DIAGNOSIS — N179 Acute kidney failure, unspecified: Secondary | ICD-10-CM | POA: Diagnosis not present

## 2018-06-04 DIAGNOSIS — M109 Gout, unspecified: Secondary | ICD-10-CM | POA: Diagnosis not present

## 2018-06-04 DIAGNOSIS — I420 Dilated cardiomyopathy: Secondary | ICD-10-CM | POA: Diagnosis not present

## 2018-06-04 DIAGNOSIS — I5023 Acute on chronic systolic (congestive) heart failure: Secondary | ICD-10-CM | POA: Diagnosis not present

## 2018-06-04 DIAGNOSIS — J181 Lobar pneumonia, unspecified organism: Secondary | ICD-10-CM | POA: Diagnosis not present

## 2018-06-04 DIAGNOSIS — Z7401 Bed confinement status: Secondary | ICD-10-CM | POA: Diagnosis not present

## 2018-06-04 DIAGNOSIS — I5032 Chronic diastolic (congestive) heart failure: Secondary | ICD-10-CM | POA: Diagnosis not present

## 2018-06-04 DIAGNOSIS — I5021 Acute systolic (congestive) heart failure: Secondary | ICD-10-CM | POA: Diagnosis not present

## 2018-06-04 DIAGNOSIS — I509 Heart failure, unspecified: Secondary | ICD-10-CM | POA: Diagnosis not present

## 2018-06-04 DIAGNOSIS — M1990 Unspecified osteoarthritis, unspecified site: Secondary | ICD-10-CM | POA: Diagnosis not present

## 2018-06-04 DIAGNOSIS — M1A09X1 Idiopathic chronic gout, multiple sites, with tophus (tophi): Secondary | ICD-10-CM | POA: Diagnosis not present

## 2018-06-04 DIAGNOSIS — M6281 Muscle weakness (generalized): Secondary | ICD-10-CM | POA: Diagnosis not present

## 2018-06-04 DIAGNOSIS — M153 Secondary multiple arthritis: Secondary | ICD-10-CM | POA: Diagnosis not present

## 2018-06-04 DIAGNOSIS — I160 Hypertensive urgency: Secondary | ICD-10-CM | POA: Diagnosis not present

## 2018-06-04 DIAGNOSIS — Z886 Allergy status to analgesic agent status: Secondary | ICD-10-CM | POA: Diagnosis not present

## 2018-06-04 DIAGNOSIS — I639 Cerebral infarction, unspecified: Secondary | ICD-10-CM | POA: Diagnosis not present

## 2018-06-04 DIAGNOSIS — Z79899 Other long term (current) drug therapy: Secondary | ICD-10-CM | POA: Diagnosis not present

## 2018-06-04 LAB — BASIC METABOLIC PANEL
Anion gap: 10 (ref 5–15)
BUN: 22 mg/dL (ref 8–23)
CO2: 24 mmol/L (ref 22–32)
Calcium: 8.9 mg/dL (ref 8.9–10.3)
Chloride: 104 mmol/L (ref 98–111)
Creatinine, Ser: 1.27 mg/dL — ABNORMAL HIGH (ref 0.44–1.00)
GFR calc Af Amer: 43 mL/min — ABNORMAL LOW (ref >60)
GFR calc non Af Amer: 37 mL/min — ABNORMAL LOW (ref >60)
Glucose, Bld: 108 mg/dL — ABNORMAL HIGH (ref 70–99)
Potassium: 3.6 mmol/L (ref 3.5–5.1)
Sodium: 138 mmol/L (ref 135–145)

## 2018-06-04 LAB — GLUCOSE, CAPILLARY
Glucose-Capillary: 107 mg/dL — ABNORMAL HIGH (ref 70–99)
Glucose-Capillary: 176 mg/dL — ABNORMAL HIGH (ref 70–99)
Glucose-Capillary: 154 mg/dL — ABNORMAL HIGH (ref 70–99)

## 2018-06-04 MED ORDER — HYDROCODONE-ACETAMINOPHEN 5-325 MG PO TABS: 1.0000 | ORAL_TABLET | ORAL | 0 refills | Status: DC | PRN

## 2018-06-04 MED ORDER — LOSARTAN POTASSIUM 100 MG PO TABS: 100.0000 mg | ORAL_TABLET | Freq: Every day | ORAL | 0 refills | Status: DC

## 2018-06-04 MED ORDER — CARVEDILOL 25 MG PO TABS: 25.0000 mg | ORAL_TABLET | Freq: Two times a day (BID) | ORAL | 0 refills | Status: DC

## 2018-06-04 MED ORDER — FERROUS SULFATE 325 (65 FE) MG PO TABS: 325.0000 mg | ORAL_TABLET | Freq: Two times a day (BID) | ORAL | 0 refills | Status: DC

## 2018-06-04 MED ORDER — AMOXICILLIN-POT CLAVULANATE 875-125 MG PO TABS: 1.0000 | ORAL_TABLET | Freq: Two times a day (BID) | ORAL | 0 refills | Status: AC

## 2018-06-04 MED ORDER — FUROSEMIDE 20 MG PO TABS: 20.0000 mg | ORAL_TABLET | Freq: Every day | ORAL | Status: DC

## 2018-06-04 MED ORDER — SPIRONOLACTONE 25 MG PO TABS: 12.5000 mg | ORAL_TABLET | Freq: Every day | ORAL | 0 refills | Status: DC

## 2018-06-04 MED ORDER — FUROSEMIDE 20 MG PO TABS: 20.0000 mg | ORAL_TABLET | Freq: Every day | ORAL | 0 refills | Status: DC

## 2018-06-04 MED ORDER — POTASSIUM CHLORIDE CRYS ER 20 MEQ PO TBCR: 20.0000 meq | EXTENDED_RELEASE_TABLET | Freq: Every day | ORAL | 0 refills | Status: DC

## 2018-06-04 MED ORDER — ISOSORBIDE MONONITRATE ER 30 MG PO TB24: 30.0000 mg | ORAL_TABLET | Freq: Every day | ORAL | 0 refills | Status: DC

## 2018-06-04 NOTE — Progress Notes (Signed)
SLP Cancellation Note  Patient Details Name: SHAKTHI SCIPIO MRN: [id number] DOB: 24-Jan-1930   Cancelled treatment:       Reason Eval/Treat Not Completed: Other (comment) Orders received for swallow evaluation. Discussed with MD results of evaluation completed earlier this admission, which recommended regualr diet, thin liquids, no SLP f/u. Per MD, will defer duplicate order.   Maxcine Ham 06/04/2018, 9:58 AM  Maxcine Ham, M.A. CCC-SLP [phone]

## 2018-06-04 NOTE — Progress Notes (Signed)
Patient will Discharge To: [facility] Anticipated DC Date:06/04/18 Family Notified:yes Verlon Au [phone] Transport BZ:JIRC   Per MD patient ready for DC to [facility] . RN, patient, patient's family, and facility notified of DC. Assessment, Fl2/Pasrr, and Discharge Summary sent to facility. RN given number for report [phone], Room 509). DC packet on chart. Ambulance transport requested for patient.   CSW signing off.  Budd Palmer LCSWA [phone]

## 2018-06-04 NOTE — Discharge Summary (Signed)
Physician Discharge Summary  Patient ID: [name] [name] MRN: [id number] DOB/AGE: Aug 26, 1930 84 y.o.  Admit date: 05/28/2018 Discharge date: 06/04/2018  Admission Diagnoses:  Discharge Diagnoses:  Active Problems:   Congestive heart failure (CHF) (HCC)   Hypertensive urgency   Pneumonia of left upper lobe due to infectious organism (HCC)   Elevated brain natriuretic peptide (BNP) level   Elevated troponin   DCM (dilated cardiomyopathy) (HCC)   Acute systolic heart failure ([place])   Discharged Condition: stable  Hospital Course: Patient is an 84 year old African-American female, with past medical history significant for rheumatoid arthritis, hypertension, grade 1 diastolic dysfunction, diabetes mellitus, CKD stage 3, CAD, PAD, and CVA. Patient presented with weakness, chest pain, shortness of breath and confusion.  Work-up done revealed acute congestive heart failure, with an EF of 20 to 25% and possible left upper lobe pneumonia.  Patient was treated with IV antibiotics.  Patient will be discharged on oral antibiotics.  There were concerns for ischemic etiology of cardiomyopathy and patient underwent R/L cardiac cath without evidence of significant ischemia.  Acute on chronic systolic heart failure New EF of 20-25%, down from 60-65% 4 years prior. Grade 1 diastolic dysfunction. Cardiology consulted for management. Currently asymptomatic. Troponin mildly elevated with flat trend. Cardiac catheterization significant for mild-moderate CAD.  Cardiology team was consulted to assist with patient's management.  Patient has been adequately diuresed.  Patient will be discharged on oral diuretics.  Essential hypertension Patient was on lisinopril on outpatient basis, but this was initially held due to acute kidney injury.  Acute kidney injury has resolved significantly.  Coreg and Imdur were continued during the hospital stay.  Kindly continue to optimize patient's blood pressure.   Acute  metabolic encephalopathy Multifactorial.  This has resolved.    Left upper lobe pneumonia Patient was managed with Ceftriaxone/azithromycin Patient will be discharged on Augmentin.  Acute kidney injury Baseline creatinine of 0.9. Peak of 1.31 Serum creatinine on discharge was 1.27.  Please continue to monitor renal function and electrolytes.   Rheumatoid arthritis Patient on prednisone and methotrexate as an outpatient.  Rheum called by prior hospitalist. Plan is to continue Plaquenil, prednisone and allopurinol.  Patient will need to follow with rheumatologist on discharge.    Gout -Continue allopurinol  Anemia of chronic disease Stable. No evidence of acute bleeding.  Chronic pain -Optimize pain control.    Right upper lobe nodule Outpatient follow-up. 3 month repeat chest CT vs PET-CT vs tissue sampling  History of diabetes mellitus Currently well controlled with last hemoglobin A1C of 5.4%.  Vernard Gambles will be discharged on metformin.   Continue to monitor blood sugar, renal function and electrolytes.    Consults: cardiology, rheumatology.   Discharge Exam: Blood pressure 112/69, pulse 86, temperature 98.3 F (36.8 C), temperature source Oral, resp. rate (!) 23, height 5' 4"  (1.626 m), weight 83 kg (183 lb), SpO2 97 %.   Disposition: Discharge disposition: 03-Skilled Nursing Facility   Discharge Instructions    Call MD for:   Complete by:  As directed    Please call MD if the symptoms worsen.   Diet - low sodium heart healthy   Complete by:  As directed    ADA diet 1800 kcal/day.   Increase activity slowly   Complete by:  As directed    Activity as per the rehab team.     Allergies as of 06/04/2018      Reactions   Clonidine Derivatives Swelling   [name] reports patient's tongue was  swollen and patient hallucinated   Fish Allergy Diarrhea, Swelling, Other (See Comments)   Turns skin "black," but can tolerate white fish Salmon- Diarrhea    Shellfish Allergy Hives   Doxycycline Rash   Indomethacin    Reaction not recalled by the patient   Lyrica [pregabalin]    Hallucinations   Methyldopa    Aldomet (for hypertension): Reaction not recalled by the patient   [employer] [citrus] Other (See Comments)   Indigestion/heartburn   Cetirizine Hcl Itching, Rash   Codeine Itching   Levaquin [levofloxacin In D5w] Rash   Tomato Rash      Medication List    STOP taking these medications   ALIVE ONCE DAILY WOMENS PO   blood glucose meter kit and supplies Kit   CHLOROPHYLL PO   cholecalciferol 1000 units tablet Commonly known as:  VITAMIN D   glucose blood test strip   labetalol 100 MG tablet Commonly known as:  NORMODYNE   lisinopril 2.5 MG tablet Commonly known as:  PRINIVIL,ZESTRIL   lisinopril 30 MG tablet Commonly known as:  PRINIVIL,ZESTRIL   methotrexate 2.5 MG tablet Commonly known as:  RHEUMATREX   oxycodone 5 MG capsule Commonly known as:  OXY-IR   oxyCODONE-acetaminophen 5-325 MG tablet Commonly known as:  PERCOCET/ROXICET   Potassium Chloride ER 20 MEQ Tbcr Replaced by:  potassium chloride SA 20 MEQ tablet   ranitidine 150 MG tablet Commonly known as:  ZANTAC   traMADol 50 MG tablet Commonly known as:  ULTRAM   vitamin C 500 MG tablet Commonly known as:  ASCORBIC ACID     TAKE these medications   albuterol 108 (90 Base) MCG/ACT inhaler Commonly known as:  PROVENTIL HFA;VENTOLIN HFA Inhale 1 puff into the lungs every 6 (six) hours as needed for wheezing or shortness of breath.   allopurinol 100 MG tablet Commonly known as:  ZYLOPRIM Take 200 mg by mouth daily. What changed:  Another medication with the same name was removed. Continue taking this medication, and follow the directions you see here.   amoxicillin-clavulanate 875-125 MG tablet Commonly known as:  AUGMENTIN Take 1 tablet by mouth 2 (two) times daily for 7 days.   aspirin EC 81 MG tablet Take 81 mg by mouth daily.    calcium-vitamin D 500-200 MG-UNIT tablet Commonly known as:  OSCAL WITH D Take 1 tablet by mouth daily.   carvedilol 25 MG tablet Commonly known as:  COREG Take 1 tablet (25 mg total) by mouth 2 (two) times daily with a meal.   diclofenac sodium 1 % Gel Commonly known as:  VOLTAREN Apply 1 application topically daily.   ferrous sulfate 325 (65 FE) MG tablet Take 1 tablet (325 mg total) by mouth 2 (two) times daily with a meal. What changed:  when to take this   furosemide 20 MG tablet Commonly known as:  LASIX Take 1 tablet (20 mg total) by mouth daily. Start taking on:  06/05/2018 What changed:  when to take this   gabapentin 100 MG capsule Commonly known as:  NEURONTIN Take 1 capsule (100 mg total) by mouth at bedtime.   hydrALAZINE 100 MG tablet Commonly known as:  APRESOLINE TAKE 1 TABLET BY MOUTH THREE TIMES DAILY   HYDROcodone-acetaminophen 5-325 MG tablet Commonly known as:  NORCO/VICODIN Take 1 tablet by mouth every 4 (four) hours as needed for moderate pain or severe pain.   hydroxychloroquine 200 MG tablet Commonly known as:  PLAQUENIL Take 200 mg by mouth 2 (two)  times daily.   hydroxypropyl methylcellulose / hypromellose 2.5 % ophthalmic solution Commonly known as:  ISOPTO TEARS / GONIOVISC Place 1 drop into both eyes 4 (four) times daily as needed for dry eyes.   isosorbide mononitrate 30 MG 24 hr tablet Commonly known as:  IMDUR Take 1 tablet (30 mg total) by mouth daily. Start taking on:  06/05/2018   leflunomide 20 MG tablet Commonly known as:  ARAVA Take 20 mg by mouth daily.   losartan 100 MG tablet Commonly known as:  COZAAR Take 1 tablet (100 mg total) by mouth daily. Start taking on:  06/05/2018   metFORMIN 500 MG tablet Commonly known as:  GLUCOPHAGE Take 1 tablet (500 mg total) by mouth daily with breakfast.   Olopatadine HCl 0.2 % Soln Apply 1 drop to eye daily.   potassium chloride SA 20 MEQ tablet Commonly known as:   K-DUR,KLOR-CON Take 1 tablet (20 mEq total) by mouth daily for 7 days. Start taking on:  06/05/2018 Replaces:  Potassium Chloride ER 20 MEQ Tbcr   predniSONE 10 MG tablet Commonly known as:  DELTASONE Take 1 tablet (10 mg total) by mouth daily with breakfast. What changed:  Another medication with the same name was removed. Continue taking this medication, and follow the directions you see here.   spironolactone 25 MG tablet Commonly known as:  ALDACTONE Take 0.5 tablets (12.5 mg total) by mouth daily. Start taking on:  06/05/2018   triamcinolone cream 0.1 % Commonly known as:  KENALOG Apply 1 application topically 2 (two) times daily.      Contact information for after-discharge care    Destination    [facility] Preferred SNF .   Service:  Skilled Nursing Contact information: [address] [place] [phone]              33 minutes spent discharging patient.  SignedBonnell Public 06/04/2018, 12:40 PM

## 2018-06-04 NOTE — Progress Notes (Signed)
Progress Note  Patient Name: [name] [name] Date of Encounter: 06/04/2018  Primary Cardiologist: Chilton Si, MD   Subjective   Cath 07/25 w/ mild nonobstructive CAD with moderately elevated LVEDP and moderate pulmonary HTN.  CO 5.68/CI 2.97.  Denies any CP ro SOB.    Inpatient Medications    Scheduled Meds: . allopurinol  200 mg Oral Daily  . aspirin EC  81 mg Oral Daily  . azithromycin  500 mg Oral QHS  . calcium-vitamin D  1 tablet Oral Daily  . carvedilol  25 mg Oral BID WC  . diclofenac sodium  1 application Topical Daily  . ferrous sulfate  325 mg Oral BID WC  . furosemide  40 mg Oral Daily  . gabapentin  100 mg Oral QHS  . guaiFENesin  1,200 mg Oral BID  . heparin  5,000 Units Subcutaneous Q8H  . hydrALAZINE  100 mg Oral TID  . hydroxychloroquine  200 mg Oral BID  . insulin aspart  0-5 Units Subcutaneous QHS  . isosorbide mononitrate  30 mg Oral Daily  . losartan  100 mg Oral Daily  . potassium chloride SA  20 mEq Oral Daily  . predniSONE  10 mg Oral Q breakfast  . sodium chloride flush  3 mL Intravenous Q12H  . sodium chloride flush  3 mL Intravenous Q12H  . spironolactone  12.5 mg Oral Daily  . triamcinolone cream  1 application Topical BID  . vitamin C  500 mg Oral QODAY   Continuous Infusions: . sodium chloride    . sodium chloride    . cefTRIAXone (ROCEPHIN)  IV 2 g (06/03/18 1730)   PRN Meds: sodium chloride, sodium chloride, acetaminophen, albuterol, ondansetron (ZOFRAN) IV, polyvinyl alcohol, sodium chloride flush, sodium chloride flush, traMADol   Vital Signs    Vitals:   06/03/18 2030 06/04/18 0500 06/04/18 0551 06/04/18 0720  BP: (!) 143/80  (!) 149/74 (!) 165/89  Pulse: 95  73 93  Resp:    (!) 25  Temp: 97.6 F (36.4 C)  98 F (36.7 C) 98.4 F (36.9 C)  TempSrc: Oral  Oral Oral  SpO2: 100%  98% 97%  Weight:  183 lb (83 kg)    Height:        Intake/Output Summary (Last 24 hours) at 06/04/2018 1100 Last data filed at 06/04/2018  0602 Gross per 24 hour  Intake 720 ml  Output 800 ml  Net -80 ml   Filed Weights   06/02/18 0500 06/03/18 0546 06/04/18 0500  Weight: 192 lb 7.4 oz (87.3 kg) 184 lb 9.6 oz (83.7 kg) 183 lb (83 kg)    Telemetry    NSR - Personally Reviewed  ECG    No new EKG to review - Personally Reviewed  Physical Exam   GEN: No acute distress.   Neck: No JVD Cardiac: RRR, no murmurs, rubs, or gallops.  Respiratory: Clear to auscultation bilaterally. GI: Soft, nontender, non-distended  MS: No edema; No deformity. Neuro:  Nonfocal  Psych: Normal affect   Labs    Chemistry Recent Labs  Lab 05/28/18 1613  06/01/18 0459  06/02/18 0405 06/03/18 0957 06/04/18 0419  NA 141   < > 141  140  --  137 135 138  K 4.1   < > 3.5  3.5  --  3.6 3.7 3.6  CL 108   < > 108  107  --  102 102 104  CO2 24   < > 26  27  --  23 24 24   GLUCOSE 154*   < > 109*  110*  --  93 136* 108*  BUN 17   < > 22  21  --  14 16 22   CREATININE 0.83   < > 1.00  0.98   < > 1.07* 1.09* 1.27*  CALCIUM 9.1   < > 8.9  8.9  --  8.6* 8.9 8.9  PROT 6.8  --   --   --   --   --   --   ALBUMIN 3.3*  --  3.0*  --   --   --   --   AST 23  --   --   --   --   --   --   ALT 20  --   --   --   --   --   --   ALKPHOS 81  --   --   --   --   --   --   BILITOT 0.6  --   --   --   --   --   --   GFRNONAA >60   < > 50*  51*   < > 46* 45* 37*  GFRAA >60   < > 57*  59*   < > 53* 52* 43*  ANIONGAP 9   < > 7  6  --  12 9 10    < > = values in this interval not displayed.     Hematology Recent Labs  Lab 05/30/18 0449 06/01/18 0459 06/01/18 1657  WBC 11.3* 10.6* 10.0  RBC 3.26* 3.21* 3.35*  HGB 9.8* 9.7* 10.0*  HCT 30.8* 30.1* 31.1*  MCV 94.5 93.8 92.8  MCH 30.1 30.2 29.9  MCHC 31.8 32.2 32.2  RDW 17.7* 17.2* 16.8*  PLT 370 409* 366    Cardiac Enzymes Recent Labs  Lab 05/29/18 0030 05/29/18 0707 05/29/18 1128  TROPONINI 0.06* 0.05* 0.06*    Recent Labs  Lab 05/28/18 1622  TROPIPOC 0.04      BNP Recent Labs  Lab 05/28/18 1614  BNP 2,077.6*     DDimer No results for input(s): DDIMER in the last 168 hours.   Radiology    No results found.  Cardiac Studies   CATH: 06/01/2018  LV end diastolic pressure is moderately elevated.  There is no aortic valve stenosis.  Hemodynamic findings consistent with moderate pulmonary hypertension.  CO 5.68 L/min; CI 2.97; Ao sat 99%; PA sat 65%; mean PA 42 mm Hg; mean PCWP 28 mm Hg  Nonobstructive CAD.  Nonischemic cardiomyopathy with mild to moderate CAD.   2D echo 05/29/2018 Study Conclusions  - Left ventricle: The cavity size was normal. Wall thickness was increased in a pattern of moderate LVH. Systolic function was severely reduced. The estimated ejection fraction was in the range of 20% to 25%. Although no diagnostic regional wall motion abnormality was identified, this possibility cannot be completely excluded on the basis of this study. - Aortic valve: Mildly calcified annulus. Trileaflet; normal thickness, moderately calcified leaflets. There was mild regurgitation. - Mitral valve: Mildly calcified annulus. Mildly calcified leaflets . There was mild to moderate regurgitation. - Left atrium: The atrium was moderately to severely dilated. - Right ventricle: The cavity size was mildly dilated. Wall thickness was normal. Systolic function was mildly reduced. - Right atrium: The atrium was moderately dilated. - Atrial septum: There was a small patent foramen ovale. There was a left-to-right shunt. - Tricuspid valve: There was moderate  regurgitation. - Pulmonary arteries: Systolic pressure was moderately increased. PA peak pressure: 47 mm Hg (S). - Pericardium, extracardiac: A trivial pericardial effusion was identified.   Patient Profile     84 y.o. female with h/o HTN, Rheumatoid Arthritis, diabetes type 2, CAD s/premoteMI, CVA, PVDand chronic diastolic heart failure, admitted  for acute CHF and CAP with echo showing new systolic HF, for which cardiology has been consulted, at the request of Dr. Mahala Menghini, Internal Medicine.Pt also endorsed recent vague chest discomfort and found to have mildly elevated troponin with flat trend, 0.06x 3.  Assessment & Plan    1. Dilated Cardiomyopathy- nonischemic by cath -EF has declined to 20 to 25%, NL LV function 2015, 60- 65%. Etiology unknown -cath showed mild nonobstructive CAD -LVEDP increased at cath -creatinine bumped this am (0.99>1.07>1.09>1.27) after IV lasix yesterday -she put out 800cc yesterday but is net positive 1.3L. Weight down 1lb from yesterday (192>184>183lbs) -decrease Lasix to 20mg  PO daily and continue spiro at 12.5mg  daily -continue carvedilol 25mg  BID, hydralazine 100mg  TID and Imdur 30mg  daily. -continue Losartan 100mg  daily and transition to [facility] at/by discharge  2. Chest Pain and Mildly Elevated Troponin (0.06 x 3): currently CP free.  -heart cath with mild nonobstructive CAD -continue long acting nitrates, ASA and BB -Lipitor 20mg  daily  added for LDL 92 -will need FLP and ALT in 6 weeks  3. CAP: antibiotics per IM. She is afebrile.   4. HTN -BP elevated today at 165/27mmHg but normal yesterday -continue carvedilol 25mg  BID, Hydralazine 100mg  TID, Imdur 30mg  daily, losartan 100mg  daily and spiro 12.5mg  daily -transition to Entresto at followup OV  5. Small LKJ:ZPHXT on echo, Lt>>Rt shunt -continue ASA   [facility] will sign off.   Medication Recommendations:  ASA 81mg  daily, carvedilol 25mg  BID, Lasix 20mg  daily, spironolactone 12.5mg  daily, Hydralazine 100mg  TID and Imdur 30 daily and Losartan 100mg  daily.  Will plan to transition to [facility] as outpt.  Other recommendations (labs, testing, etc):  BMET in 1 week, FLP/ALT in 6 weeks Follow up as an outpatient:  7-10 days for [facility] appt in our office  For questions or updates, please contact [facility] Please consult  [email or web address] for contact info under Cardiology/STEMI.      Signed, Armanda Magic, MD  06/04/2018, 11:00 AM

## 2018-06-06 ENCOUNTER — Telehealth: Payer: Self-pay | Admitting: Cardiovascular Disease

## 2018-06-06 ENCOUNTER — Telehealth: Payer: Self-pay | Admitting: Cardiology

## 2018-06-06 NOTE — Telephone Encounter (Signed)
error 

## 2018-06-06 NOTE — Telephone Encounter (Signed)
Left message for pt to call.

## 2018-06-06 NOTE — Telephone Encounter (Signed)
New message   Daughter calling: Requesting order for low sodium diet to be sent to  [facility]

## 2018-06-06 NOTE — Telephone Encounter (Signed)
Unable to reach pt or leave a message  

## 2018-06-06 NOTE — Telephone Encounter (Signed)
New message   [facility], [place], [place]  P Cv [address] Scheduling    Pt needs a TCM 7-10 days with Dr. Leonides Sake team.  Pt also needs BMP that day.    TOC appt 8/7

## 2018-06-07 ENCOUNTER — Non-Acute Institutional Stay (SKILLED_NURSING_FACILITY): Payer: Medicare Other | Admitting: Adult Health

## 2018-06-07 ENCOUNTER — Telehealth: Payer: Self-pay | Admitting: Family Medicine

## 2018-06-07 ENCOUNTER — Encounter: Payer: Self-pay | Admitting: Adult Health

## 2018-06-07 DIAGNOSIS — E1122 Type 2 diabetes mellitus with diabetic chronic kidney disease: Secondary | ICD-10-CM

## 2018-06-07 DIAGNOSIS — I639 Cerebral infarction, unspecified: Secondary | ICD-10-CM | POA: Diagnosis not present

## 2018-06-07 DIAGNOSIS — I5033 Acute on chronic diastolic (congestive) heart failure: Secondary | ICD-10-CM

## 2018-06-07 DIAGNOSIS — I130 Hypertensive heart and chronic kidney disease with heart failure and stage 1 through stage 4 chronic kidney disease, or unspecified chronic kidney disease: Secondary | ICD-10-CM

## 2018-06-07 DIAGNOSIS — M0579 Rheumatoid arthritis with rheumatoid factor of multiple sites without organ or systems involvement: Secondary | ICD-10-CM

## 2018-06-07 DIAGNOSIS — J181 Lobar pneumonia, unspecified organism: Secondary | ICD-10-CM

## 2018-06-07 DIAGNOSIS — M1A09X1 Idiopathic chronic gout, multiple sites, with tophus (tophi): Secondary | ICD-10-CM

## 2018-06-07 DIAGNOSIS — D638 Anemia in other chronic diseases classified elsewhere: Secondary | ICD-10-CM

## 2018-06-07 DIAGNOSIS — J4520 Mild intermittent asthma, uncomplicated: Secondary | ICD-10-CM

## 2018-06-07 DIAGNOSIS — J189 Pneumonia, unspecified organism: Secondary | ICD-10-CM

## 2018-06-07 DIAGNOSIS — I2510 Atherosclerotic heart disease of native coronary artery without angina pectoris: Secondary | ICD-10-CM

## 2018-06-07 DIAGNOSIS — N183 Chronic kidney disease, stage 3 unspecified: Secondary | ICD-10-CM

## 2018-06-07 NOTE — Telephone Encounter (Signed)
Please advise 

## 2018-06-07 NOTE — Telephone Encounter (Signed)
Copied from CRM [id number]. Topic: Inquiry >> Jun 07, 2018  1:49 PM Tamela Oddi, NT wrote: Reason for CRM: Patient daughter called and states that her mother was just D/C from [facility]. She is at [employer] see states her concern is that she is not eating and very weak, cant do any OT with her. She is wanting to get Dr. Earlene Plater involved with this . She needs some advice on what to do and the next course for her recovery. She is requesting a call back.Marland Kitchen CB# [phone].

## 2018-06-07 NOTE — Telephone Encounter (Signed)
See note

## 2018-06-07 NOTE — Telephone Encounter (Signed)
Pt's daughter called in to follow up on form. She said that DSS is stating that they didn't receive FL2 form, I did advise that it was faxed on 05/26/18, daughter expressed understanding. She would like to know if office could re-fax to: [phone] ATTN: Mrs. Gentle. She said that this is a better fax number for receiver.      Showing form has been updated in Media, please assist further.

## 2018-06-07 NOTE — Progress Notes (Signed)
Location:   [facility] Room Number: 509 P Place of Service:  SNF (31)   CODE STATUS: Full Code  Allergies  Allergen Reactions  . Clonidine Derivatives Swelling    [name]  . Fish Allergy Diarrhea, Swelling and Other (See Comments)    Turns skin "black," but can tolerate white fish Salmon- Diarrhea  . Shellfish Allergy Hives  . Doxycycline Rash  . Indomethacin     Reaction not recalled by the patient  . Lyrica [Pregabalin]     Hallucinations   . Methyldopa     Aldomet (for hypertension): Reaction not recalled by the patient  . Orange Fruit [Citrus] Other (See Comments)    Indigestion/heartburn  . Cetirizine Hcl Itching and Rash  . Codeine Itching  . Levaquin [Levofloxacin In D5w] Rash  . Tomato Rash    Chief Complaint  Patient presents with  . Hospitalization Follow-up    Hospital Follow up    HPI:  She is a 84 year old woman who was hospitalized from 05-28-18 through 06-04-18. She has a history of RA; CAD; diabetes. She went to the ED for increased weakness; chest pain and shortness of breath. She was treated for acute on chronic systolic heart failure; left lobe pneumonia. She is here for short term rehab with her goal to return back home. She is slightly confused today and is unable to fully participate in the hpi or ros. There are no reports of fevers; no cough or shortness of breath. She will continue to be followed for her chronic illnesses including: heart failure; hypertension and renal failure. There are no nursing concerns at this time.   Past Medical History:  Diagnosis Date  . Allergic rhinitis due to pollen 04/27/2007  . Arthritis    "in q joint" (08/07/2013)  . CHF (congestive heart failure) (HCC)   . Coronary atherosclerosis of native coronary artery   . Cough   . Disorder of bone and cartilage, unspecified   . Edema 05/03/2013  . Exertional shortness of  breath    "sometimes" (08/07/2013)  . First degree atrioventricular block   . Gout, unspecified 04/19/2013  . Hyperlipidemia 04/27/2007  . Hypertension   . Muscle spasm   . Muscle weakness (generalized)   . Myocardial infarction (HCC) ~ 1970  . Onychia and paronychia of toe   . Osteoarthrosis, unspecified whether generalized or localized, unspecified site 04/27/2007  . Other fall   . Other malaise and fatigue   . Pneumonia 05/2018  . Prepatellar bursitis   . Seizures (HCC)   . Stroke (HCC) 01/17/2014  . TIA (transient ischemic attack)    "a few one summer" (08/07/2013)  . Type II diabetes mellitus (HCC)    "fasting 90-110s" (08/07/2013)  . Unspecified essential hypertension 08/08/2013  . Unspecified vitamin D deficiency     Past Surgical History:  Procedure Laterality Date  . ABDOMINAL HYSTERECTOMY  04/1980  . APPENDECTOMY  1960  . CARDIAC CATHETERIZATION  2003  . CATARACT EXTRACTION W/ INTRAOCULAR LENS  IMPLANT, BILATERAL Bilateral ~ 2012  . JOINT REPLACEMENT    . REPLACEMENT TOTAL KNEE Right 09/2005  . RIGHT/LEFT HEART CATH AND CORONARY ANGIOGRAPHY N/A 06/01/2018   Procedure: RIGHT/LEFT HEART CATH AND CORONARY ANGIOGRAPHY;  Surgeon: Corky Crafts, MD;  Location: [facility] INVASIVE CV LAB;  Service: Cardiovascular;  Laterality: N/A;  . SHOULDER OPEN ROTATOR CUFF REPAIR Right 07/1999  . TONSILLECTOMY  08/1974  .  TOTAL KNEE ARTHROPLASTY Left 08/07/2013   Procedure: TOTAL KNEE ARTHROPLASTY- LEFT;  Surgeon: Dannielle Huh, MD;  Location: [facility] OR;  Service: Orthopedics;  Laterality: Left;  . TRANSTHORACIC ECHOCARDIOGRAM  2003   EF 55-65%; mild concentric LVH    Social History   Socioeconomic History  . Marital status: Widowed    Spouse name: Not on file  . Number of children: Not on file  . Years of education: Not on file  . Highest education level: Not on file  Occupational History  . Not on file  Social Needs  . Financial resource strain: Not on file  . Food insecurity:     Worry: Not on file    Inability: Not on file  . Transportation needs:    Medical: Not on file    Non-medical: Not on file  Tobacco Use  . Smoking status: Never Smoker  . Smokeless tobacco: Never Used  Substance and Sexual Activity  . Alcohol use: No    Alcohol/week: 0.0 oz  . Drug use: No  . Sexual activity: Never  Lifestyle  . Physical activity:    Days per week: Not on file    Minutes per session: Not on file  . Stress: Not on file  Relationships  . Social connections:    Talks on phone: Not on file    Gets together: Not on file    Attends religious service: Not on file    Active member of club or organization: Not on file    Attends meetings of clubs or organizations: Not on file    Relationship status: Not on file  . Intimate partner violence:    Fear of current or ex partner: Not on file    Emotionally abused: Not on file    Physically abused: Not on file    Forced sexual activity: Not on file  Other Topics Concern  . Not on file  Social History Narrative   Widowed   Walks with cane   Family History  Problem Relation Age of Onset  . Heart attack Father       VITAL SIGNS BP (!) 154/94   Pulse 97   Temp (!) 97.4 F (36.3 C)   Resp 20   Ht 5\' 4"  (1.626 m)   Wt 183 lb (83 kg)   SpO2 96%   BMI 31.41 kg/m   Outpatient Encounter Medications as of 06/07/2018  Medication Sig  . allopurinol (ZYLOPRIM) 100 MG tablet Take 200 mg by mouth daily.  06/09/2018 amoxicillin-clavulanate (AUGMENTIN) 875-125 MG tablet Take 1 tablet by mouth 2 (two) times daily for 7 days.  Marland Kitchen aspirin EC 81 MG tablet Take 81 mg by mouth daily.  . calcium-vitamin D (OSCAL WITH D) 500-200 MG-UNIT per tablet Take 1 tablet by mouth daily.   . carvedilol (COREG) 25 MG tablet Take 1 tablet (25 mg total) by mouth 2 (two) times daily with a meal.  . diclofenac sodium (VOLTAREN) 1 % GEL Apply 1 application topically daily.  . ferrous sulfate 325 (65 FE) MG tablet Take 1 tablet (325 mg total) by mouth 2  (two) times daily with a meal.  . furosemide (LASIX) 20 MG tablet Take 1 tablet (20 mg total) by mouth daily.  Marland Kitchen gabapentin (NEURONTIN) 100 MG capsule Take 1 capsule (100 mg total) by mouth at bedtime.  . hydrALAZINE (APRESOLINE) 100 MG tablet TAKE 1 TABLET BY MOUTH THREE TIMES DAILY  . HYDROcodone-acetaminophen (NORCO/VICODIN) 5-325 MG tablet Take 1 tablet by mouth every  4 (four) hours as needed for up to 5 days for moderate pain or severe pain.  . hydroxychloroquine (PLAQUENIL) 200 MG tablet Take 200 mg by mouth 2 (two) times daily.  . hydroxypropyl methylcellulose / hypromellose (ISOPTO TEARS / GONIOVISC) 2.5 % ophthalmic solution Place 1 drop into both eyes 4 (four) times daily as needed for dry eyes.  . isosorbide mononitrate (IMDUR) 30 MG 24 hr tablet Take 1 tablet (30 mg total) by mouth daily.  Marland Kitchen leflunomide (ARAVA) 20 MG tablet Take 20 mg by mouth daily.   Marland Kitchen losartan (COZAAR) 100 MG tablet Take 1 tablet (100 mg total) by mouth daily.  . metFORMIN (GLUCOPHAGE) 500 MG tablet Take 1 tablet (500 mg total) by mouth daily with breakfast.  . Olopatadine HCl 0.2 % SOLN Apply 1 drop to eye daily.  . potassium chloride SA (K-DUR,KLOR-CON) 20 MEQ tablet Take 1 tablet (20 mEq total) by mouth daily for 7 days.  . predniSONE (DELTASONE) 10 MG tablet Take 1 tablet (10 mg total) by mouth daily with breakfast.  . spironolactone (ALDACTONE) 25 MG tablet Take 0.5 tablets (12.5 mg total) by mouth daily.  Marland Kitchen triamcinolone cream (KENALOG) 0.1 % Apply 1 application topically 2 (two) times daily.  Marland Kitchen albuterol (PROVENTIL HFA;VENTOLIN HFA) 108 (90 Base) MCG/ACT inhaler Inhale 1 puff into the lungs every 6 (six) hours as needed for wheezing or shortness of breath.   No facility-administered encounter medications on file as of 06/07/2018.      SIGNIFICANT DIAGNOSTIC EXAMS  LABS REVIEWED:   05-28-18: chest x-ray: Mild airspace opacification over the left upper lobe likely pneumonia. Slight worsening moderate  cardiomegaly.  7--20-19: ct of head: 1. No acute abnormality. 2. Stable atrophy. 3. Progressive chronic small vessel white matter ischemic changes in both cerebral hemispheres. 4. Improved left ethmoid sinusitis. 5. Resolved right mastoid mucosal thickening and fluid.  05-28-18: ct angio of chest:  1. There is a 1.5 x 1.1 cm nodular opacity in the anterior segment of the right upper lobe. Consider one of the following in 3 months for both low-risk and high-risk individuals: (a) repeat chest CT, (b) follow-up PET-CT, or (c) tissue sampling.  2. Anterior segment left upper lobe airspace consolidation consistent with pneumonia. 3. No demonstrable pulmonary embolus. No thoracic aortic aneurysm. No dissection seen. It should be noted that the contrast bolus in the aorta is insufficient to exclude dissection confidently as a differential consideration by radiography. 4. There is aortic atherosclerosis. There are foci of coronary artery calcification. There are foci of great vessel and mesenteric arterial vessel calcification as well. 5.  Small right pleural effusion with bibasilar atelectasis. 6. Subcentimeter left thyroid nodule. Per consensus guidelines, this lesion does not warrant additional imaging surveillance. 7.  No demonstrable thoracic adenopathy. 8. Reflux into the inferior vena cava and hepatic veins may be indicative of a degree of increase in right heart pressure. Aortic Atherosclerosis   05-29-18: 2-d echo: - Left ventricle: The cavity size was normal. Wall thickness was increased in a pattern of moderate LVH. Systolic function was severely reduced. The estimated ejection fraction was in the range of 20% to 25%. Although no diagnostic regional wall motion abnormality was identified, this possibility cannot be completely excluded on the basis of this study. - Aortic valve: Mildly calcified annulus. Trileaflet; normal thickness, moderately calcified leaflets. There was mild  regurgitation. - Mitral valve: Mildly calcified annulus. Mildly calcified leaflets There was mild to moderate regurgitation. - Left atrium: The atrium was moderately to severely  dilated. - Right ventricle: The cavity size was mildly dilated. Wall thickness was normal. Systolic function was mildly reduced. - Right atrium: The atrium was moderately dilated. - Atrial septum: There was a small patent foramen ovale. There was a left-to-right shunt. - Tricuspid valve: There was moderate regurgitation. - Pulmonary arteries: Systolic pressure was moderately increased. PA peak pressure: 47 mm Hg (S). - Pericardium, extracardiac: A trivial pericardial effusion was identified.  05-29-18: ct of head: Atrophy and white matter disease, stable in appearance from 05/28/2018. No acute intracranial findings.  06-01-18: chest x-ray: Interval clearing of infiltrate from left upper lobe. Mild atelectasis remains in this area. No new opacity. There is a degree of underlying pulmonary vascular congestion. There is advanced arthropathy in both shoulders with evidence suggesting avascular necrosis in each humeral head. Note that the nodular opacity in the right upper lobe seen on recent chest CT is not appreciable by radiography. Please see recommendations with respect to recent chest CT report.  06-01-18: right/eft cardiac cath: LV end diastolic pressure is moderately elevated.  There is no aortic valve stenosis. Hemodynamic findings consistent with moderate pulmonary hypertension.  CO 5.68 L/min; CI 2.97; Ao sat 99%; PA sat 65%; mean PA 42 mm Hg; mean PCWP 28 mm Hg  Nonobstructive CAD. Nonischemic cardiomyopathy with mild to moderate CAD.   LABS REVIEWED: TODAY:   02-02-18: uric acid: 6.1 02-14-18: tsh 1.82 05-28-18: wbc 9.7; hgb 9.8; hct 30.1; mcv 92.6; plt 368; glucose 154; bun 17; creat 0.83; k+ 4.1; na++ 141; ca 9.1; liver normal albumin 3.3; BNP 2077.6; blood and urine culture: no growth 05-29-18: chol 178; ldl 92;  trig 64; hdl 73; vit B 12: 433; ferritin 103; iron 20; tibc 269; mag 1.7 05-31-18: glucose 118; bun 26; creat 1.10; k+ 3.6; na++ 143; ca 8.8 06-01-18: wbc 10.6; hgb 9.7; hct 30.1; mcv 93.8; plt 409; glucose 110; bun 21; creat 0.98; k+ 3.5; na++ 140; ca 8.9; phos 2.7; albumin 3.0  06-03-18: glucose 136; bun 16; creat 1.09; k+ 3.7; na++ 135; ca 8.9   Review of Systems  Unable to perform ROS: Other (confusion )   Physical Exam  Constitutional: She appears well-developed and well-nourished. No distress.  Neck: No thyromegaly present.  Cardiovascular: Normal rate, regular rhythm and intact distal pulses.  Murmur heard. 1/6  Pulmonary/Chest: Effort normal and breath sounds normal. No respiratory distress.  Abdominal: Soft. Bowel sounds are normal. She exhibits no distension. There is no tenderness.  Musculoskeletal: She exhibits edema.  Bilateral trace lower extremity edema Is able to move all extremities   Lymphadenopathy:    She has no cervical adenopathy.  Neurological: She is alert.  Skin: Skin is warm and dry. She is not diaphoretic.  Psychiatric: She has a normal mood and affect.      ASSESSMENT/ PLAN:  TODAY:   1. Acute on chronic diastolic heart failure: stable EF 20-25% (05-29-18): will continue lasix 20 mg day; imdur 30 mg daily coreg 25 mg twice daily aldactone 12.5 mg daily apresoline 100 mg three times daily   2.  CAD: native artery native heart: is status post cardiac cath; is stable will continue imdur 30 mg daily coreg 25 mg twice daly asa 81 mg daily   3. Benign hypertensive heart disease and kidney disease with CHF and stage 3 chronic kidney disease: is without change: b/p 154/94; will continue coreg 25 mg twice daily apresoline 100 mg three times daily cozaar 100 mg daily aldactone 12.5 mg daily   4.  Stroke: is neurologically stable: will continue asa 81 mg daily   5. Pneumonia of left upper lobe due to infectious organism: is stable will complete agumentin 875 mg  twice daily 7 days and will monitor her status.   6. Mild intermittent uncomplicated reactive airway disease: is stable albuterol 1 puff every 6 hours as needed  7. DM type 2 with renal complications; stage 3 without long term insulin use: is stable will continue metformin 500 mg daily   8. 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 Rheumatoid arthritis involving multiple sites with rheumatoid factor: is without change: will continue plaquenil 200 mg twice daily has voltaren gel daily has vicodin 5/32mg  every 4 hours as needed and neurontin 100 mg nightly   9. CKD stage 3 due to type 2 diabetes mellitus: is stable bun 16 creat 1.09  10. Anemia of chronic disease: is stable hgb 9.7 will continue iron twice daily      MD is aware of resident's narcotic use and is in agreement with current plan of care. We will attempt to wean resident as apropriate   Synthia Innocent NP [facility]  Contact [phone] Monday through Friday 8am- 5pm  After hours call [phone]

## 2018-06-08 ENCOUNTER — Telehealth: Payer: Self-pay | Admitting: Family Medicine

## 2018-06-08 LAB — BASIC METABOLIC PANEL
BUN: 17 (ref 4–21)
Creatinine: 1.1 (ref 0.5–1.1)
Glucose: 112
Potassium: 4.7 (ref 3.4–5.3)
Sodium: 134 — AB (ref 137–147)

## 2018-06-08 LAB — CBC AND DIFFERENTIAL
HCT: 32 — AB (ref 36–46)
Hemoglobin: 10.5 — AB (ref 12.0–16.0)
Platelets: 415 — AB (ref 150–399)
WBC: 7.3

## 2018-06-08 LAB — IRON,TIBC AND FERRITIN PANEL
Ferritin: 184.1
Iron: 19
TIBC: 183
UIBC: 164

## 2018-06-08 LAB — VITAMIN B12: Vitamin B-12: 612

## 2018-06-08 NOTE — Telephone Encounter (Signed)
Copied from CRM [id number]. Topic: Quick Communication - See Telephone Encounter >> Jun 08, 2018  9:30 AM Windy Kalata, NT wrote: CRM for notification. See Telephone encounter for: 06/08/18.  Patient daughter is calling and states that the assisted living facility has not received the fax for the Fl2 form and she is calling to follow up on this. She states she was told it was faxed yesterday 06/07/18. Please advise.

## 2018-06-08 NOTE — Telephone Encounter (Signed)
Called and l/m to let her know that I have faxed to updated number  and left copy for her to pick up at reception.

## 2018-06-08 NOTE — Telephone Encounter (Signed)
Patient is currently in SNF - [facility]

## 2018-06-08 NOTE — Telephone Encounter (Signed)
Patient  daughter is calling to request the FL2 forms to be fax, she stated she is unable to pick up the documentation today. The fax number is [phone]. With Attention to Ms.Gentle

## 2018-06-08 NOTE — Telephone Encounter (Signed)
Forms have been re faxed and copy placed at reception per other message.

## 2018-06-08 NOTE — Telephone Encounter (Signed)
Call daughter. Find out concerns. I'm willing to talk to someone at that facility to see what is happening.

## 2018-06-08 NOTE — Telephone Encounter (Signed)
Spoke with patients daughter and she stated that her mother is doing better today. She stated that they was just going to take everything day by day.   Daughter said that she needed a copy of her moms FL2 form to take to [employer]. I have wrote out another copy and placed up front for the daughter to pick up.

## 2018-06-08 NOTE — Telephone Encounter (Signed)
See note

## 2018-06-09 NOTE — Telephone Encounter (Signed)
She has an appt with you next week do you want to address this then or is there something you would like for me to send at this current time.

## 2018-06-10 DIAGNOSIS — I509 Heart failure, unspecified: Secondary | ICD-10-CM | POA: Diagnosis not present

## 2018-06-10 DIAGNOSIS — M153 Secondary multiple arthritis: Secondary | ICD-10-CM | POA: Diagnosis not present

## 2018-06-10 DIAGNOSIS — M1990 Unspecified osteoarthritis, unspecified site: Secondary | ICD-10-CM | POA: Diagnosis not present

## 2018-06-10 DIAGNOSIS — Z79899 Other long term (current) drug therapy: Secondary | ICD-10-CM | POA: Diagnosis not present

## 2018-06-10 DIAGNOSIS — M1A09X1 Idiopathic chronic gout, multiple sites, with tophus (tophi): Secondary | ICD-10-CM | POA: Diagnosis not present

## 2018-06-10 DIAGNOSIS — M059 Rheumatoid arthritis with rheumatoid factor, unspecified: Secondary | ICD-10-CM | POA: Diagnosis not present

## 2018-06-10 DIAGNOSIS — Z886 Allergy status to analgesic agent status: Secondary | ICD-10-CM | POA: Diagnosis not present

## 2018-06-10 DIAGNOSIS — I130 Hypertensive heart and chronic kidney disease with heart failure and stage 1 through stage 4 chronic kidney disease, or unspecified chronic kidney disease: Secondary | ICD-10-CM | POA: Diagnosis not present

## 2018-06-10 DIAGNOSIS — Z79891 Long term (current) use of opiate analgesic: Secondary | ICD-10-CM | POA: Diagnosis not present

## 2018-06-10 DIAGNOSIS — Z888 Allergy status to other drugs, medicaments and biological substances status: Secondary | ICD-10-CM | POA: Diagnosis not present

## 2018-06-10 DIAGNOSIS — M069 Rheumatoid arthritis, unspecified: Secondary | ICD-10-CM | POA: Diagnosis not present

## 2018-06-10 DIAGNOSIS — M109 Gout, unspecified: Secondary | ICD-10-CM | POA: Diagnosis not present

## 2018-06-10 DIAGNOSIS — Z885 Allergy status to narcotic agent status: Secondary | ICD-10-CM | POA: Diagnosis not present

## 2018-06-13 ENCOUNTER — Encounter: Payer: Self-pay | Admitting: Internal Medicine

## 2018-06-13 ENCOUNTER — Non-Acute Institutional Stay (SKILLED_NURSING_FACILITY): Payer: Medicare Other | Admitting: Internal Medicine

## 2018-06-13 DIAGNOSIS — G9341 Metabolic encephalopathy: Secondary | ICD-10-CM

## 2018-06-13 DIAGNOSIS — I5043 Acute on chronic combined systolic (congestive) and diastolic (congestive) heart failure: Secondary | ICD-10-CM

## 2018-06-13 DIAGNOSIS — E1122 Type 2 diabetes mellitus with diabetic chronic kidney disease: Secondary | ICD-10-CM

## 2018-06-13 DIAGNOSIS — N179 Acute kidney failure, unspecified: Secondary | ICD-10-CM

## 2018-06-13 DIAGNOSIS — I160 Hypertensive urgency: Secondary | ICD-10-CM | POA: Diagnosis not present

## 2018-06-13 DIAGNOSIS — J181 Lobar pneumonia, unspecified organism: Secondary | ICD-10-CM

## 2018-06-13 DIAGNOSIS — I10 Essential (primary) hypertension: Secondary | ICD-10-CM | POA: Diagnosis not present

## 2018-06-13 DIAGNOSIS — N189 Chronic kidney disease, unspecified: Secondary | ICD-10-CM

## 2018-06-13 DIAGNOSIS — N183 Type 2 diabetes mellitus with diabetic chronic kidney disease: Secondary | ICD-10-CM

## 2018-06-13 DIAGNOSIS — J189 Pneumonia, unspecified organism: Secondary | ICD-10-CM

## 2018-06-13 DIAGNOSIS — M0579 Rheumatoid arthritis with rheumatoid factor of multiple sites without organ or systems involvement: Secondary | ICD-10-CM

## 2018-06-13 DIAGNOSIS — M1A09X0 Idiopathic chronic gout, multiple sites, without tophus (tophi): Secondary | ICD-10-CM

## 2018-06-13 NOTE — Progress Notes (Signed)
:    Location:  [facility] Room Number: [phone] Place of Service:  SNF (31)  Randon Goldsmith. Lyn Hollingshead, MD  Patient Care Team: Helane Rima, DO as PCP - General (Family Medicine) Chilton Si, MD as PCP - Cardiology (Cardiology)  Extended Emergency Contact Information Primary Emergency Contact: Hampton,Jeannette Address: [address]          [place], [place] [address] [place] of [place] Home Phone: [phone] Mobile Phone: [phone] Relation: Daughter Secondary Emergency Contact: [name] Address: [address]          [place], [place] [address] [place] of [place] Mobile Phone: [phone] Relation: Daughter     Allergies: Clonidine derivatives; Fish allergy; Shellfish allergy; Doxycycline; Indomethacin; Lyrica [pregabalin]; Methyldopa; Orange fruit [citrus]; Cetirizine hcl; Codeine; Levaquin [levofloxacin in d5w]; and Tomato  Chief Complaint  Patient presents with  . New Admit To SNF    Admit to [facility]    HPI: Patient is 84 y.o. female with CVA, hypertension, diabetes, hyperlipidemia, congestive heart failure, CKD, RA, anemia, and seizure, who presented to [facility] emergency department with onset of weakness.  Patient has been seen in the ER 4 days ago and discharged home after observation.  She had reported localized chest pain associated with shortness of breath, high blood pressure and confusion daily changes or that her rheumatoid arthritis medication were switched recently patient is.  Patient is normally functionally independent but has required assistance with ADLs recently.  Patient denied fever, chills, nausea, vomiting, diarrhea, abdominal pain, dysuria, frequency, changes in mental status.e patient was admitted to [facility] from 7/20-27 where she was treated for acute on chronic systolic congestive heart failure and hypertensive urgency.  As well as acute metabolic encephalopathy, left upper lobe  pneumonia and acute kidney injury.  The above has been resolved patient is admitted to skilled nursing facility for OT/PT.  While at skilled nursing facility patient will be followed for rheumatoid arthritis treated with Plaquenil prednisone gout treated with allopurinol and diabetes mellitus treated with metformin.  Past Medical History:  Diagnosis Date  . Allergic rhinitis due to pollen 04/27/2007  . Arthritis    "in q joint" (08/07/2013)  . CHF (congestive heart failure) (HCC)   . Coronary atherosclerosis of native coronary artery   . Cough   . Disorder of bone and cartilage, unspecified   . Edema 05/03/2013  . Exertional shortness of breath    "sometimes" (08/07/2013)  . First degree atrioventricular block   . Gout, unspecified 04/19/2013  . Hyperlipidemia 04/27/2007  . Hypertension   . Muscle spasm   . Muscle weakness (generalized)   . Myocardial infarction (HCC) ~ 1970  . Onychia and paronychia of toe   . Osteoarthrosis, unspecified whether generalized or localized, unspecified site 04/27/2007  . Other fall   . Other malaise and fatigue   . Pneumonia 05/2018  . Prepatellar bursitis   . Seizures (HCC)   . Stroke (HCC) 01/17/2014  . TIA (transient ischemic attack)    "a few one summer" (08/07/2013)  . Type II diabetes mellitus (HCC)    "fasting 90-110s" (08/07/2013)  . Unspecified essential hypertension 08/08/2013  . Unspecified vitamin D deficiency     Past Surgical History:  Procedure Laterality Date  . ABDOMINAL HYSTERECTOMY  04/1980  . APPENDECTOMY  1960  . CARDIAC CATHETERIZATION  2003  . CATARACT EXTRACTION W/ INTRAOCULAR LENS  IMPLANT, BILATERAL Bilateral ~ 2012  . JOINT REPLACEMENT    . REPLACEMENT  TOTAL KNEE Right 09/2005  . RIGHT/LEFT HEART CATH AND CORONARY ANGIOGRAPHY N/A 06/01/2018   Procedure: RIGHT/LEFT HEART CATH AND CORONARY ANGIOGRAPHY;  Surgeon: Corky Crafts, MD;  Location: [facility] INVASIVE CV LAB;  Service: Cardiovascular;  Laterality: N/A;  .  SHOULDER OPEN ROTATOR CUFF REPAIR Right 07/1999  . TONSILLECTOMY  08/1974  . TOTAL KNEE ARTHROPLASTY Left 08/07/2013   Procedure: TOTAL KNEE ARTHROPLASTY- LEFT;  Surgeon: Dannielle Huh, MD;  Location: [facility] OR;  Service: Orthopedics;  Laterality: Left;  . TRANSTHORACIC ECHOCARDIOGRAM  2003   EF 55-65%; mild concentric LVH    Allergies as of 06/13/2018      Reactions   Clonidine Derivatives Swelling   [name]   Fish Allergy Diarrhea, Swelling, Other (See Comments)   Turns skin "black," but can tolerate white fish Salmon- Diarrhea   Shellfish Allergy Hives   Doxycycline Rash   Indomethacin    Reaction not recalled by the patient   Lyrica [pregabalin]    Hallucinations   Methyldopa    Aldomet (for hypertension): Reaction not recalled by the patient   [employer] [citrus] Other (See Comments)   Indigestion/heartburn   Cetirizine Hcl Itching, Rash   Codeine Itching   Levaquin [levofloxacin In D5w] Rash   Tomato Rash      Medication List        Accurate as of 06/13/18  1:33 PM. Always use your most recent med list.          albuterol 108 (90 Base) MCG/ACT inhaler Commonly known as:  PROVENTIL HFA;VENTOLIN HFA Inhale 1 puff into the lungs every 6 (six) hours as needed for wheezing or shortness of breath.   allopurinol 100 MG tablet Commonly known as:  ZYLOPRIM Take 200 mg by mouth daily.   aspirin EC 81 MG tablet Take 81 mg by mouth daily.   calcium-vitamin D 500-200 MG-UNIT tablet Commonly known as:  OSCAL WITH D Take 1 tablet by mouth daily.   carvedilol 25 MG tablet Commonly known as:  COREG Take 1 tablet (25 mg total) by mouth 2 (two) times daily with a meal.   diclofenac sodium 1 % Gel Commonly known as:  VOLTAREN Apply 1 application topically daily.   ferrous sulfate 325 (65 FE) MG tablet Take 1 tablet (325 mg total) by mouth 2 (two) times daily with a meal.   furosemide 20 MG  tablet Commonly known as:  LASIX Take 1 tablet (20 mg total) by mouth daily.   gabapentin 100 MG capsule Commonly known as:  NEURONTIN Take 1 capsule (100 mg total) by mouth at bedtime.   hydrALAZINE 100 MG tablet Commonly known as:  APRESOLINE TAKE 1 TABLET BY MOUTH THREE TIMES DAILY   hydroxychloroquine 200 MG tablet Commonly known as:  PLAQUENIL Take 200 mg by mouth 2 (two) times daily.   hydroxypropyl methylcellulose / hypromellose 2.5 % ophthalmic solution Commonly known as:  ISOPTO TEARS / GONIOVISC Place 1 drop into both eyes 4 (four) times daily as needed for dry eyes.   isosorbide mononitrate 30 MG 24 hr tablet Commonly known as:  IMDUR Take 1 tablet (30 mg total) by mouth daily.   leflunomide 20 MG tablet Commonly known as:  ARAVA Take 20 mg by mouth daily.   losartan 100 MG tablet Commonly known as:  COZAAR Take 1 tablet (100 mg total) by mouth daily.   metFORMIN 500 MG tablet Commonly known as:  GLUCOPHAGE Take 1 tablet (500 mg  total) by mouth daily with breakfast.   Olopatadine HCl 0.2 % Soln Apply 1 drop to eye daily.   potassium chloride SA 20 MEQ tablet Commonly known as:  K-DUR,KLOR-CON Take 1 tablet (20 mEq total) by mouth daily for 7 days.   predniSONE 10 MG tablet Commonly known as:  DELTASONE Take 1 tablet (10 mg total) by mouth daily with breakfast.   spironolactone 25 MG tablet Commonly known as:  ALDACTONE Take 0.5 tablets (12.5 mg total) by mouth daily.   triamcinolone cream 0.1 % Commonly known as:  KENALOG Apply 1 application topically 2 (two) times daily.       No orders of the defined types were placed in this encounter.   Immunization History  Administered Date(s) Administered  . Influenza Whole 09/06/2012  . Influenza, High Dose Seasonal PF 09/13/2017  . Influenza,inj,Quad PF,6+ Mos 09/12/2013, 09/11/2014, 07/23/2015, 09/16/2016  . Pneumococcal Polysaccharide-23 04/13/2005, 11/09/2009, 09/12/2015  . Td 04/13/2005  .  Zoster 11/10/2011    Social History   Tobacco Use  . Smoking status: Never Smoker  . Smokeless tobacco: Never Used  Substance Use Topics  . Alcohol use: No    Alcohol/week: 0.0 oz    Family history is   Family History  Problem Relation Age of Onset  . Heart attack Father       Review of Systems  DATA OBTAINED: from patient, nurse GENERAL:  no fevers, fatigue, appetite changes SKIN: No itching, or rash EYES: No eye pain, redness, discharge EARS: No earache, tinnitus, change in hearing NOSE: No congestion, drainage or bleeding  MOUTH/THROAT: No mouth or tooth pain, No sore throat RESPIRATORY: No cough, wheezing, SOB CARDIAC: No chest pain, palpitations, lower extremity edema  GI: No abdominal pain, No N/V/D or constipation, No heartburn or reflux  GU: No dysuria, frequency or urgency, or incontinence  MUSCULOSKELETAL: No unrelieved bone/joint pain NEUROLOGIC: No headache, dizziness or focal weakness PSYCHIATRIC: No c/o anxiety or sadness   Vitals:   06/13/18 1329  BP: 110/63  Pulse: 62  Resp: 20  Temp: 98 F (36.7 C)  SpO2: 97%    SpO2 Readings from Last 1 Encounters:  06/13/18 97%   Body mass index is 31.41 kg/m.     Physical Exam  GENERAL APPEARANCE: Alert, conversant,  No acute distress.  SKIN: No diaphoresis rash HEAD: Normocephalic, atraumatic  EYES: Conjunctiva/lids clear. Pupils round, reactive. EOMs intact.  EARS: External exam WNL, canals clear. Hearing grossly normal.  NOSE: No deformity or discharge.  MOUTH/THROAT: Lips w/o lesions  RESPIRATORY: Breathing is even, unlabored. Lung sounds are clear   CARDIOVASCULAR: Heart RRR no murmurs, rubs or gallops. No peripheral edema.   GASTROINTESTINAL: Abdomen is soft, non-tender, not distended w/ normal bowel sounds. GENITOURINARY: Bladder non tender, not distended  MUSCULOSKELETAL: No abnormal joints or musculature NEUROLOGIC:  Cranial nerves 2-12 grossly intact. Moves all extremities   PSYCHIATRIC: Mood and affect appropriate to situation, no behavioral issues  Patient Active Problem List   Diagnosis Date Noted  . Hypertensive urgency   . Pneumonia of left upper lobe due to infectious organism (HCC)   . Elevated brain natriuretic peptide (BNP) level   . Elevated troponin   . DCM (dilated cardiomyopathy) (HCC)   . Benign hypertensive heart and kidney disease with CHF and stage 3 chronic kidney disease (HCC) 05/28/2018  . Diarrhea 02/02/2018  . Hypokalemia 02/02/2018  . SIRS (systemic inflammatory response syndrome) (HCC) 11/26/2017  . Acute pain of right shoulder 10/12/2017  . Adhesive capsulitis  of right shoulder 10/12/2017  . Idiopathic chronic gout of multiple sites with tophus 08/25/2017  . High risk medication use 08/25/2017  . Rheumatic nodule 06/28/2017  . History of CHF (congestive heart failure) 02/18/2017  . History of total knee arthroplasty, bilateral 02/18/2017  . History of rotator cuff surgery 02/18/2017  . Obesity (BMI 30.0-34.9) 12/08/2016  . High risk medications (not anticoagulants) long-term use 09/21/2016  . Hammer toe 10/31/2015  . Acute on chronic diastolic heart failure (HCC) 09/12/2015  . Onychomycosis 04/24/2015  . Midline low back pain without sciatica 06/27/2014  . Bilateral edema of lower extremity 06/05/2014  . CKD stage 3 due to type 2 diabetes mellitus (HCC) 06/05/2014  . DM (diabetes mellitus), type 2 with renal complications (HCC) 06/05/2014    Class: Chronic  . Anemia of chronic disease 05/01/2014  . Fluctuating blood pressure 02/06/2014  . Stroke (HCC) 01/17/2014  . Rheumatoid arthritis involving multiple sites (HCC) 12/19/2013    Class: Chronic  . CAD (coronary artery disease) 10/18/2013  . Fibromyalgia 05/10/2013  . Reactive airway disease 04/19/2013  . Gout 04/19/2013  . Hyperlipidemia 04/27/2007  . Allergic rhinitis 04/27/2007  . Diverticulosis 04/27/2007      Labs reviewed: Basic Metabolic Panel:     Component Value Date/Time   NA 134 (A) 06/08/2018   K 4.7 06/08/2018   CL 104 06/04/2018 0419   CO2 24 06/04/2018 0419   GLUCOSE 108 (H) 06/04/2018 0419   BUN 17 06/08/2018   CREATININE 1.1 06/08/2018   CREATININE 1.27 (H) 06/04/2018 0419   CREATININE 1.19 (H) 07/21/2017 1454   CALCIUM 8.9 06/04/2018 0419   PROT 6.8 05/28/2018 1613   PROT 7.2 09/09/2015 1210   ALBUMIN 3.0 (L) 06/01/2018 0459   ALBUMIN 3.9 09/09/2015 1210   AST 23 05/28/2018 1613   ALT 20 05/28/2018 1613   ALKPHOS 81 05/28/2018 1613   BILITOT 0.6 05/28/2018 1613   BILITOT 0.5 09/09/2015 1210   GFRNONAA 37 (L) 06/04/2018 0419   GFRNONAA 42 (L) 07/21/2017 1454   GFRAA 43 (L) 06/04/2018 0419   GFRAA 48 (L) 07/21/2017 1454    Recent Labs    11/28/17 0809  05/29/18 0030  05/30/18 0449  06/01/18 0459  06/02/18 0405 06/03/18 0957 06/04/18 0419 06/08/18  NA 134*   < >  --    < > 143   < > 141  140  --  137 135 138 134*  K 4.5   < >  --    < > 4.1   < > 3.5  3.5  --  3.6 3.7 3.6 4.7  CL 107   < >  --    < > 108   < > 108  107  --  102 102 104  --   CO2 21*   < >  --    < > 25   < > 26  27  --  23 24 24   --   GLUCOSE 132*   < >  --    < > 94   < > 109*  110*  --  93 136* 108*  --   BUN 33*   < >  --    < > 24*   < > 22  21  --  14 16 22 17   CREATININE 1.05*   < >  --    < > 1.31*   < > 1.00  0.98   < > 1.07* 1.09* 1.27* 1.1  CALCIUM  8.7*   < >  --    < > 8.7*   < > 8.9  8.9  --  8.6* 8.9 8.9  --   MG 2.7*  --  1.7  --  1.7  --   --   --   --   --   --   --   PHOS  --   --   --   --   --   --  2.7  --   --   --   --   --    < > = values in this interval not displayed.   Liver Function Tests: Recent Labs    02/02/18 0212 02/14/18 1032 05/28/18 1613 06/01/18 0459  AST 17 16 23   --   ALT 11* 12 20  --   ALKPHOS 44 46 81  --   BILITOT 0.4 0.5 0.6  --   PROT 6.0* 6.9 6.8  --   ALBUMIN 3.3* 3.8 3.3* 3.0*   No results for input(s): LIPASE, AMYLASE in the last 8760 hours. No results for input(s):  AMMONIA in the last 8760 hours. CBC: Recent Labs    02/14/18 1032  05/28/18 1900  05/30/18 0449 06/01/18 0459 06/01/18 1657 06/08/18  WBC 12.1*   < > 9.7  --  11.3* 10.6* 10.0 7.3  NEUTROABS 10.3*  --  7.5  --   --  7.4  --   --   HGB 11.6*   < > 9.8*  --  9.8* 9.7* 10.0* 10.5*  HCT 34.7*   < > 30.1*   < > 30.8* 30.1* 31.1* 32*  MCV 97.2   < > 92.6  --  94.5 93.8 92.8  --   PLT 450.0*   < > 368  --  370 409* 366 415*   < > = values in this interval not displayed.   Lipid Recent Labs    05/29/18 0030  CHOL 178  HDL 73  LDLCALC 92  TRIG 64    Cardiac Enzymes: Recent Labs    05/29/18 0030 05/29/18 0707 05/29/18 1128  TROPONINI 0.06* 0.05* 0.06*   BNP: Recent Labs    05/28/18 1614  BNP 2,077.6*   No results found for: [facility] Lab Results  Component Value Date   HGBA1C 5.4 11/26/2017   Lab Results  Component Value Date   TSH 2.029 05/29/2018   Lab Results  Component Value Date   VITAMINB12 612 06/08/2018   Lab Results  Component Value Date   FOLATE >19.9 07/03/2014   Lab Results  Component Value Date   IRON 19 06/08/2018   TIBC 183 06/08/2018   FERRITIN 184.10 06/08/2018    Imaging and Procedures obtained prior to SNF admission: Dg Chest 2 View  Result Date: 05/28/2018 CLINICAL DATA:  Weakness 6 days with intermittent altered mental status. EXAM: CHEST - 2 VIEW COMPARISON:  11/16/2017 FINDINGS: Lungs are adequately inflated demonstrate airspace consolidation over the left midlung likely left upper lobe pneumonia. No evidence of effusion. Slight worsening moderate cardiomegaly. Remainder of the exam is unchanged IMPRESSION: Mild airspace opacification over the left upper lobe likely pneumonia. Slight worsening moderate cardiomegaly. Electronically Signed   By: Elberta Fortis [name]   On: 05/28/2018 16:10   Ct Head Wo Contrast  Result Date: 05/29/2018 CLINICAL DATA:  Drowsiness with RIGHT facial droop. EXAM: CT HEAD WITHOUT CONTRAST TECHNIQUE:  Contiguous axial images were obtained from the base of the skull through the vertex without intravenous contrast.  COMPARISON:  05/28/2018. FINDINGS: Brain: No evidence for acute infarction, hemorrhage, mass lesion, hydrocephalus, or extra-axial fluid. Generalized atrophy. Moderate small vessel disease. Vascular: Calcification of the cavernous internal carotid arteries consistent with cerebrovascular atherosclerotic disease. No signs of intracranial large vessel occlusion. Skull: Calvarium intact. Sinuses/Orbits: Clear sinuses.  No orbital findings. Other: No mastoid fluid. IMPRESSION: Atrophy and white matter disease, stable in appearance from 05/28/2018. No acute intracranial findings. Electronically Signed   By: Elsie Stain [name]   On: 05/29/2018 21:44   Ct Head Wo Contrast  Result Date: 05/28/2018 CLINICAL DATA:  Generalized weakness since 05/22/2018. Intermittent altered mental status. EXAM: CT HEAD WITHOUT CONTRAST TECHNIQUE: Contiguous axial images were obtained from the base of the skull through the vertex without intravenous contrast. COMPARISON:  11/29/2016. FINDINGS: Brain: Diffusely enlarged ventricles and subarachnoid spaces. Patchy white matter low density in both cerebral hemispheres. No intracranial hemorrhage, mass lesion or CT evidence of acute infarction. Vascular: No hyperdense vessel or unexpected calcification. Skull: Bilateral hyperostosis frontalis. Sinuses/Orbits: Status post bilateral cataract extraction. Single residual opacified left ethmoid air cell. Resolved right mastoid air cell opacification. Other: None. IMPRESSION: 1. No acute abnormality. 2. Stable atrophy. 3. Progressive chronic small vessel white matter ischemic changes in both cerebral hemispheres. 4. Improved left ethmoid sinusitis. 5. Resolved right mastoid mucosal thickening and fluid. Electronically Signed   By: Beckie Salts [name]   On: 05/28/2018 15:56   Ct Angio Chest Pe W And/or Wo Contrast  Result Date:  05/28/2018 CLINICAL DATA:  Shortness of breath. EXAM: CT ANGIOGRAPHY CHEST WITH CONTRAST TECHNIQUE: Multidetector CT imaging of the chest was performed using the standard protocol during bolus administration of intravenous contrast. Multiplanar CT image reconstructions and MIPs were obtained to evaluate the vascular anatomy. CONTRAST:  100 mL ISOVUE-370 IOPAMIDOL (ISOVUE-370) INJECTION 76% COMPARISON:  Chest CT angiogram September 11, 2015; chest radiograph May 28, 2018 FINDINGS: Cardiovascular: There is no demonstrable pulmonary embolus. There is no thoracic aortic aneurysm. No dissection is seen in the thoracic aorta. Note that the contrast bolus in the aorta is less than optimal for assessment for potential dissection. Visualized great vessels appear unremarkable except for slight calcification in the left common carotid artery. Note that the left common carotid and right innominate arteries arise as a common trunk, an anatomic variant. There is no appreciable pericardial effusion or pericardial thickening. There is aortic atherosclerosis. There are foci of coronary artery calcification. Mediastinum/Nodes: There is a subcentimeter nodular opacity in the left lobe of the thyroid. No dominant thyroid mass evident. There is no evident thoracic adenopathy. No esophageal lesions are appreciable. Lungs/Pleura: There is airspace consolidation in the anterior segment left upper lobe consistent with pneumonia. There is atelectatic change in each lower lobe with a small right pleural effusion evident. On axial slice 69 series 11, there is a 5 mm nodular opacity in the lateral segment of the right middle lobe. There is a nodular opacity in the anterior segment of the right upper lobe measuring 1.5 x 1.1 cm. This nodular opacity is best seen on coronal slice 88 series 7 but is also appreciable on sagittal slice 61 series 8 and axial slice 23 series 11. Upper Abdomen: In the visualized upper abdominal region, there is mild  reflux into the inferior vena cava and hepatic veins suggesting increase in right heart pressure. There is atherosclerotic calcification in the aorta and proximal major mesenteric arterial vessels. There is a cyst arising from the lateral upper pole right kidney measuring 5.2 x 5.1 cm.  Musculoskeletal: There is degenerative change in the thoracic spine. There are no blastic or lytic bone lesions. There is extensive arthropathy in each shoulder. No evident chest wall lesions. Review of the MIP images confirms the above findings. IMPRESSION: 1. There is a 1.5 x 1.1 cm nodular opacity in the anterior segment of the right upper lobe. Consider one of the following in 3 months for both low-risk and high-risk individuals: (a) repeat chest CT, (b) follow-up PET-CT, or (c) tissue sampling. This recommendation follows the consensus statement: Guidelines for Management of Incidental Pulmonary Nodules Detected on CT Images: From the Fleischner Society 2017; Radiology 2017; 284:228-243. 2. Anterior segment left upper lobe airspace consolidation consistent with pneumonia. 3. No demonstrable pulmonary embolus. No thoracic aortic aneurysm. No dissection seen. It should be noted that the contrast bolus in the aorta is insufficient to exclude dissection confidently as a differential consideration by radiography. 4. There is aortic atherosclerosis. There are foci of coronary artery calcification. There are foci of great vessel and mesenteric arterial vessel calcification as well. 5.  Small right pleural effusion with bibasilar atelectasis. 6. Subcentimeter left thyroid nodule. Per consensus guidelines, this lesion does not warrant additional imaging surveillance. 7.  No demonstrable thoracic adenopathy. 8. Reflux into the inferior vena cava and hepatic veins may be indicative of a degree of increase in right heart pressure. Aortic Atherosclerosis (ICD10-I70.0). Electronically Signed   By: Bretta Bang III [name]   On: 05/28/2018  19:08     Not all labs, radiology exams or other studies done during hospitalization come through on my EPIC note; however they are reviewed by me.    Assessment and Plan  Acute on chronic systolic heart failure-new EF 20 to 25% down from 60-65% 4 years ago with grade 1 diastolic dysfunction, cardiac catheterization significant for mild to moderate CAD; patient was adequately diuresed SNF -admitted for OT/PT continue Coreg 25 mg twice daily and Lasix 20 mg daily as well as losartan 100 mg daily  Uncontrolled hypertension-patient was treated with her Coreg and hydralazine with the lisinopril being held due to acute kidney injury SNF -pressure control and patient will be put back on Coreg 25 mg twice daily Lasix 20 mg daily hydralazine 100 mg 3 times a day and Imdur 30 mg daily as well as losartan 100 mg daily and spironolactone 12.5 mg daily with lisinopril being discontinued  Acute metabolic encephalopathy- multifactorial; resolved  Left upper lobe pneumonia- patient was managed with Rocephin and Zithromax in house SNF-discharged on Augmentin 875 twice daily for 7 days  Acute kidney injury on chronic kidney disease-baseline creatinine 0.9; peaked at 1.31; creatinine at discharge 1.27 SNF -follow-up BMP  Rheumatoid arthritis- patient was on prednisone and methotrexate as an outpatient; rheumatologist was called by prior hospitalist plan is to continue Plaquenil, prednisone and allopurinol with patient to follow with rheumatology on discharge SNF -continue Plaquenil 200 mg twice daily and prednisone 10 mg daily  Gout SNF -controlled continue allopurinol 200 mg daily  Diabetes mellitus type II SNF -A1c 5.4; continue metformin 500 mg daily, patient is on ARB, not on statin   Time spent greater than 45 minutes;> 50% of time with patient was spent reviewing records, labs, tests and studies, counseling and developing plan of care  Thurston Hole D. Lyn Hollingshead, MD

## 2018-06-14 NOTE — Progress Notes (Signed)
Cardiology Office Note:    Date:  06/15/2018   ID:  [name] [name], DOB 05/16/1930, MRN [id number]  PCP:  Helane Rima, DO  Cardiologist:  Chilton Si, MD   Referring MD: Helane Rima, DO   Chief Complaint  Patient presents with  . Transitions Of Care    History of Present Illness:    [name] [name] is a 84 y.o. female with a hx of CAD status post remote MI, moderate pulmonary hypertension, rheumatoid arthritis, diabetes type 2, CVA, PVD, and chronic diastolic heart failure.  Recently admitted for acute CHF and CAP with echo showing new systolic heart failure.  Cardiology was consulted.  Echo with LVEF 20 to 25%.  Etiology is unknown, cath showed mild nonobstructive CAD.  Renal function decreased with IV Lasix.  Lasix was decreased to 20 mg p.o. daily, spironolactone 25 mg daily, carvedilol 25 mg twice daily, hydralazine 100 mg 3 times daily, Imdur 30 mg daily, losartan 100 mg daily with plans to transition to [facility].  He was discharged on 06/04/2018.  He was treated with IV antibiotics for CAP.  Of note, there was a small PFO on echo with left to right shunt.  She returns today for hospital follow-up.  She is still in the rehab facility.  They are not serving her a low-sodium diet.  We will send orders to the facility for a low-sodium diet and fluid restriction of less than 2 L/day.  She denies chest pain, shortness of breath, palpitations, and syncope.  It sounds as though she is doing quite well in rehab and expect to be discharged this week.  She is having some lower extremity swelling that is improving on the Lasix.  She states that she has lost about 8 to 10 pounds and is feeling well.   Past Medical History:  Diagnosis Date  . Allergic rhinitis due to pollen 04/27/2007  . Arthritis    "in q joint" (08/07/2013)  . CHF (congestive heart failure) (HCC)   . Coronary atherosclerosis of native coronary artery   . Cough   . Disorder of bone and cartilage, unspecified   .  Edema 05/03/2013  . Exertional shortness of breath    "sometimes" (08/07/2013)  . First degree atrioventricular block   . Gout, unspecified 04/19/2013  . Hyperlipidemia 04/27/2007  . Hypertension   . Muscle spasm   . Muscle weakness (generalized)   . Myocardial infarction (HCC) ~ 1970  . Onychia and paronychia of toe   . Osteoarthrosis, unspecified whether generalized or localized, unspecified site 04/27/2007  . Other fall   . Other malaise and fatigue   . Pneumonia 05/2018  . Prepatellar bursitis   . Seizures (HCC)   . Stroke (HCC) 01/17/2014  . TIA (transient ischemic attack)    "a few one summer" (08/07/2013)  . Type II diabetes mellitus (HCC)    "fasting 90-110s" (08/07/2013)  . Unspecified essential hypertension 08/08/2013  . Unspecified vitamin D deficiency     Past Surgical History:  Procedure Laterality Date  . ABDOMINAL HYSTERECTOMY  04/1980  . APPENDECTOMY  1960  . CARDIAC CATHETERIZATION  2003  . CATARACT EXTRACTION W/ INTRAOCULAR LENS  IMPLANT, BILATERAL Bilateral ~ 2012  . JOINT REPLACEMENT    . REPLACEMENT TOTAL KNEE Right 09/2005  . RIGHT/LEFT HEART CATH AND CORONARY ANGIOGRAPHY N/A 06/01/2018   Procedure: RIGHT/LEFT HEART CATH AND CORONARY ANGIOGRAPHY;  Surgeon: Corky Crafts, MD;  Location: [facility] INVASIVE CV LAB;  Service: Cardiovascular;  Laterality: N/A;  .  SHOULDER OPEN ROTATOR CUFF REPAIR Right 07/1999  . TONSILLECTOMY  08/1974  . TOTAL KNEE ARTHROPLASTY Left 08/07/2013   Procedure: TOTAL KNEE ARTHROPLASTY- LEFT;  Surgeon: Dannielle Huh, MD;  Location: [facility] OR;  Service: Orthopedics;  Laterality: Left;  . TRANSTHORACIC ECHOCARDIOGRAM  2003   EF 55-65%; mild concentric LVH    Current Medications: Current Meds  Medication Sig  . albuterol (PROVENTIL HFA;VENTOLIN HFA) 108 (90 Base) MCG/ACT inhaler Inhale 1 puff into the lungs every 6 (six) hours as needed for wheezing or shortness of breath.  Marland Kitchen aspirin EC 81 MG tablet Take 81 mg by mouth daily.  .  calcium-vitamin D (OSCAL WITH D) 500-200 MG-UNIT per tablet Take 1 tablet by mouth daily.   . carvedilol (COREG) 25 MG tablet Take 1 tablet (25 mg total) by mouth 2 (two) times daily with a meal.  . diclofenac sodium (VOLTAREN) 1 % GEL Apply 1 application topically daily.  . ferrous sulfate 325 (65 FE) MG tablet Take 1 tablet (325 mg total) by mouth 2 (two) times daily with a meal.  . furosemide (LASIX) 20 MG tablet Take 1 tablet (20 mg total) by mouth daily.  Marland Kitchen gabapentin (NEURONTIN) 100 MG capsule Take 1 capsule (100 mg total) by mouth at bedtime.  . hydrALAZINE (APRESOLINE) 100 MG tablet TAKE 1 TABLET BY MOUTH THREE TIMES DAILY  . hydroxychloroquine (PLAQUENIL) 200 MG tablet Take 200 mg by mouth 2 (two) times daily.  . hydroxypropyl methylcellulose / hypromellose (ISOPTO TEARS / GONIOVISC) 2.5 % ophthalmic solution Place 1 drop into both eyes 4 (four) times daily as needed for dry eyes.  . isosorbide mononitrate (IMDUR) 30 MG 24 hr tablet Take 1 tablet (30 mg total) by mouth daily.  Marland Kitchen leflunomide (ARAVA) 20 MG tablet Take 20 mg by mouth daily.   Marland Kitchen losartan (COZAAR) 100 MG tablet Take 1 tablet (100 mg total) by mouth daily.  . metFORMIN (GLUCOPHAGE) 500 MG tablet Take 1 tablet (500 mg total) by mouth daily with breakfast.  . Olopatadine HCl 0.2 % SOLN Apply 1 drop to eye daily.  . predniSONE (DELTASONE) 10 MG tablet Take 1 tablet (10 mg total) by mouth daily with breakfast.  . spironolactone (ALDACTONE) 25 MG tablet Take 0.5 tablets (12.5 mg total) by mouth daily.  Marland Kitchen triamcinolone cream (KENALOG) 0.1 % Apply 1 application topically 2 (two) times daily.     Allergies:   Clonidine derivatives; Fish allergy; Shellfish allergy; Doxycycline; Indomethacin; Lyrica [pregabalin]; Methyldopa; Orange fruit [citrus]; Cetirizine hcl; Codeine; Levaquin [levofloxacin in d5w]; and Tomato   Social History   Socioeconomic History  . Marital status: Widowed    Spouse name: Not on file  . Number of children:  Not on file  . Years of education: Not on file  . Highest education level: Not on file  Occupational History  . Not on file  Social Needs  . Financial resource strain: Not on file  . Food insecurity:    Worry: Not on file    Inability: Not on file  . Transportation needs:    Medical: Not on file    Non-medical: Not on file  Tobacco Use  . Smoking status: Never Smoker  . Smokeless tobacco: Never Used  Substance and Sexual Activity  . Alcohol use: No    Alcohol/week: 0.0 oz  . Drug use: No  . Sexual activity: Never  Lifestyle  . Physical activity:    Days per week: Not on file    Minutes per session: Not  on file  . Stress: Not on file  Relationships  . Social connections:    Talks on phone: Not on file    Gets together: Not on file    Attends religious service: Not on file    Active member of club or organization: Not on file    Attends meetings of clubs or organizations: Not on file    Relationship status: Not on file  Other Topics Concern  . Not on file  Social History Narrative   Widowed   Walks with cane     Family History: The patient's family history includes Heart attack in her father.  ROS:   Please see the history of present illness.     All other systems reviewed and are negative.  EKGs/Labs/Other Studies Reviewed:    The following studies were reviewed today:  CATH: 06/01/2018  LV end diastolic pressure is moderately elevated.  There is no aortic valve stenosis.  Hemodynamic findings consistent with moderate pulmonary hypertension.  CO 5.68 L/min; CI 2.97; Ao sat 99%; PA sat 65%; mean PA 42 mm Hg; mean PCWP 28 mm Hg  Nonobstructive CAD.  Nonischemic cardiomyopathy with mild to moderate CAD.  2D echo 05/29/2018 Study Conclusions  - Left ventricle: The cavity size was normal. Wall thickness was increased in a pattern of moderate LVH. Systolic function was severely reduced. The estimated ejection fraction was in the range of 20%  to 25%. Although no diagnostic regional wall motion abnormality was identified, this possibility cannot be completely excluded on the basis of this study. - Aortic valve: Mildly calcified annulus. Trileaflet; normal thickness, moderately calcified leaflets. There was mild regurgitation. - Mitral valve: Mildly calcified annulus. Mildly calcified leaflets . There was mild to moderate regurgitation. - Left atrium: The atrium was moderately to severely dilated. - Right ventricle: The cavity size was mildly dilated. Wall thickness was normal. Systolic function was mildly reduced. - Right atrium: The atrium was moderately dilated. - Atrial septum: There was a small patent foramen ovale. There was a left-to-right shunt. - Tricuspid valve: There was moderate regurgitation. - Pulmonary arteries: Systolic pressure was moderately increased. PA peak pressure: 47 mm Hg (S). - Pericardium, extracardiac: A trivial pericardial effusion was identified.   EKG:  EKG is not ordered today.    Recent Labs: 05/28/2018: ALT 20; B Natriuretic Peptide 2,077.6 05/29/2018: TSH 2.029 05/30/2018: Magnesium 1.7 06/08/2018: BUN 17; Creatinine 1.1; Hemoglobin 10.5; Platelets 415; Potassium 4.7; Sodium 134  Recent Lipid Panel    Component Value Date/Time   CHOL 178 05/29/2018 0030   CHOL 185 03/04/2016 1620   TRIG 64 05/29/2018 0030   HDL 73 05/29/2018 0030   HDL 73 03/04/2016 1620   CHOLHDL 2.4 05/29/2018 0030   VLDL 13 05/29/2018 0030   LDLCALC 92 05/29/2018 0030   LDLCALC 89 03/04/2016 1620    Physical Exam:    VS:  BP 111/70   Pulse 87   Ht 5\' 4"  (1.626 m)   Wt 182 lb 9.6 oz (82.8 kg)   BMI 31.34 kg/m     Wt Readings from Last 3 Encounters:  06/15/18 182 lb 9.6 oz (82.8 kg)  06/13/18 183 lb (83 kg)  06/07/18 183 lb (83 kg)     GEN:  Well nourished, well developed in no acute distress HEENT: Normal NECK: No JVD; No carotid bruits LYMPHATICS: No lymphadenopathy CARDIAC:  RRR, no murmurs, rubs, gallops RESPIRATORY:  Clear to auscultation without rales, wheezing or rhonchi  ABDOMEN: Soft, non-tender, non-distended MUSCULOSKELETAL:  No edema; No deformity  SKIN: Warm and dry NEUROLOGIC:  Alert and oriented x 3 PSYCHIATRIC:  Normal affect   ASSESSMENT:    1. Chronic diastolic heart failure (HCC)   2. DCM (dilated cardiomyopathy) (HCC)   3. Coronary artery disease involving native coronary artery of native heart without angina pectoris   4. Benign hypertensive heart and kidney disease with CHF and stage 3 chronic kidney disease (HCC)   5. Medication management    PLAN:    In order of problems listed above:  Chronic diastolic heart failure (HCC) DCM (dilated cardiomyopathy) (HCC) Suspect nonischemic cardiomyopathy Will collect a BMP today. She is doing well on her diuretic regimen. She has not problems with breathing. She reports 8-10 lb weight loss. I will send orders to the facility for a low-sodium diet and fluid restriction of less than 2 L/day.  She expects to be discharged this week.  I have asked her to weigh daily and record daily blood pressures. BMP will guide decisions regarding supplemental potassium in the setting of low dose lasix and spironolactone.  Coronary artery disease involving native coronary artery of native heart without angina pectoris Nonobstructive CAD by heart cath. No chest pain.   Benign hypertensive heart and kidney disease with CHF and stage 3 chronic kidney disease (HCC) Pressure well-controlled. No medication changes. Will check a BMP.   Medication management BMP will guide decisions regarding continued potassium replacement.   Follow up in 3 months.   Medication Adjustments/Labs and Tests Ordered: Current medicines are reviewed at length with the patient today.  Concerns regarding medicines are outlined above.  Orders Placed This Encounter  Procedures  . Basic metabolic panel   No orders of the defined types  were placed in this encounter.   Signed, Marcelino Duster, PA  06/15/2018 11:32 AM    [facility]

## 2018-06-15 ENCOUNTER — Ambulatory Visit (INDEPENDENT_AMBULATORY_CARE_PROVIDER_SITE_OTHER): Payer: Medicare Other | Admitting: Physician Assistant

## 2018-06-15 ENCOUNTER — Encounter: Payer: Self-pay | Admitting: Physician Assistant

## 2018-06-15 VITALS — BP 111/70 | HR 87 | Ht 64.0 in | Wt 182.6 lb

## 2018-06-15 DIAGNOSIS — N183 Chronic kidney disease, stage 3 unspecified: Secondary | ICD-10-CM

## 2018-06-15 DIAGNOSIS — I130 Hypertensive heart and chronic kidney disease with heart failure and stage 1 through stage 4 chronic kidney disease, or unspecified chronic kidney disease: Secondary | ICD-10-CM | POA: Diagnosis not present

## 2018-06-15 DIAGNOSIS — I420 Dilated cardiomyopathy: Secondary | ICD-10-CM

## 2018-06-15 DIAGNOSIS — Z79899 Other long term (current) drug therapy: Secondary | ICD-10-CM | POA: Diagnosis not present

## 2018-06-15 DIAGNOSIS — I5032 Chronic diastolic (congestive) heart failure: Secondary | ICD-10-CM

## 2018-06-15 DIAGNOSIS — I2510 Atherosclerotic heart disease of native coronary artery without angina pectoris: Secondary | ICD-10-CM | POA: Diagnosis not present

## 2018-06-15 LAB — BASIC METABOLIC PANEL
BUN/Creatinine Ratio: 25 (ref 12–28)
BUN: 32 mg/dL — ABNORMAL HIGH (ref 8–27)
CO2: 19 mmol/L — ABNORMAL LOW (ref 20–29)
Calcium: 9.6 mg/dL (ref 8.7–10.3)
Chloride: 106 mmol/L (ref 96–106)
Creatinine, Ser: 1.27 mg/dL — ABNORMAL HIGH (ref 0.57–1.00)
GFR calc Af Amer: 44 mL/min/{1.73_m2} — ABNORMAL LOW (ref >59)
GFR calc non Af Amer: 38 mL/min/{1.73_m2} — ABNORMAL LOW (ref >59)
Glucose: 99 mg/dL (ref 65–99)
Potassium: 4.3 mmol/L (ref 3.5–5.2)
Sodium: 138 mmol/L (ref 134–144)

## 2018-06-15 NOTE — Patient Instructions (Signed)
Medication Instructions:  Your physician recommends that you continue on your current medications as directed. Please refer to the Current Medication list given to you today.  Labwork: TODAY (BMET)  Follow-Up: 1 month with Dr. Duke Salvia  Any Other Special Instructions Will Be Listed Below (If Applicable).  Facility orders: Low sodium diet; less than 1500 mg of sodium daily Fluid restriction: less than 2 liters of fluid per day Weight patient daily  Drink Ensure two times a day   If you need a refill on your cardiac medications before your next appointment, please call your pharmacy.

## 2018-06-16 ENCOUNTER — Encounter: Payer: Self-pay | Admitting: Internal Medicine

## 2018-06-16 ENCOUNTER — Telehealth: Payer: Self-pay | Admitting: Physician Assistant

## 2018-06-16 ENCOUNTER — Non-Acute Institutional Stay (SKILLED_NURSING_FACILITY): Payer: Medicare Other | Admitting: Internal Medicine

## 2018-06-16 DIAGNOSIS — M1A09X0 Idiopathic chronic gout, multiple sites, without tophus (tophi): Secondary | ICD-10-CM

## 2018-06-16 DIAGNOSIS — I5043 Acute on chronic combined systolic (congestive) and diastolic (congestive) heart failure: Secondary | ICD-10-CM

## 2018-06-16 DIAGNOSIS — I160 Hypertensive urgency: Secondary | ICD-10-CM

## 2018-06-16 DIAGNOSIS — J189 Pneumonia, unspecified organism: Secondary | ICD-10-CM

## 2018-06-16 DIAGNOSIS — N179 Acute kidney failure, unspecified: Secondary | ICD-10-CM

## 2018-06-16 DIAGNOSIS — E1122 Type 2 diabetes mellitus with diabetic chronic kidney disease: Secondary | ICD-10-CM

## 2018-06-16 DIAGNOSIS — J181 Lobar pneumonia, unspecified organism: Secondary | ICD-10-CM | POA: Diagnosis not present

## 2018-06-16 DIAGNOSIS — G9341 Metabolic encephalopathy: Secondary | ICD-10-CM | POA: Diagnosis not present

## 2018-06-16 DIAGNOSIS — M0579 Rheumatoid arthritis with rheumatoid factor of multiple sites without organ or systems involvement: Secondary | ICD-10-CM

## 2018-06-16 DIAGNOSIS — I10 Essential (primary) hypertension: Secondary | ICD-10-CM

## 2018-06-16 DIAGNOSIS — N189 Chronic kidney disease, unspecified: Secondary | ICD-10-CM

## 2018-06-16 DIAGNOSIS — N183 Type 2 diabetes mellitus with diabetic chronic kidney disease: Secondary | ICD-10-CM

## 2018-06-16 DIAGNOSIS — M1A09X1 Idiopathic chronic gout, multiple sites, with tophus (tophi): Secondary | ICD-10-CM

## 2018-06-16 NOTE — Telephone Encounter (Signed)
New Message   [name] [name] is calling on behalf of patient to obtain lab results. Please call.

## 2018-06-16 NOTE — Progress Notes (Signed)
Location:  [facility] Room Number: [phone] Place of Service:  SNF [phone])  [name] [name]. Lyn Hollingshead, MD  Patient Care Team: Helane Rima, DO as PCP - General (Family Medicine) Chilton Si, MD as PCP - Cardiology (Cardiology)  Extended Emergency Contact Information Primary Emergency Contact: Hampton,Jeannette Address: [address]          [place], [place] [address] D[place] of [place] Home Phone: [phone] Mobile Phone: [phone] Relation: Daughter Secondary Emergency Contact: [name] Address: [address]          [place], [place] [address] [place] of [place] Mobile Phone: [phone] Relation: Daughter  Allergies  Allergen Reactions  . Clonidine Derivatives Swelling    [name]  . Fish Allergy Diarrhea, Swelling and Other (See Comments)    Turns skin "black," but can tolerate white fish Salmon- Diarrhea  . Shellfish Allergy Hives  . Doxycycline Rash  . Indomethacin     Reaction not recalled by the patient  . Lyrica [Pregabalin]     Hallucinations   . Methyldopa     Aldomet (for hypertension): Reaction not recalled by the patient  . Orange Fruit [Citrus] Other (See Comments)    Indigestion/heartburn  . Cetirizine Hcl Itching and Rash  . Codeine Itching  . Levaquin [Levofloxacin In D5w] Rash  . Tomato Rash    Chief Complaint  Patient presents with  . Discharge Note    Discharge from [facility]    HPI:  84 y.o. female with CVA, hypertension, diabetes, hyperlipidemia, congestive heart failure, chronic kidney disease, RA, anemia, and seizure who presented to [facility] emergency department with onset of weakness patient was admitted to [facility] long hospital from 7/20-27 where she was treated for acute on chronic congestive heart failure and hypertensive urgency, as well as acute metabolic encephalopathy, left upper lobe pneumonia, and  acute kidney injury.  The above were resolved and patient was admitted to skilled nursing facility for OT/PT and is now ready to be discharged to home.  Note patient had a right upper lobe nodule noted during hospitalization which will need following up in 40-month by modality of choice.      Past Medical History:  Diagnosis Date  . Allergic rhinitis due to pollen 04/27/2007  . Arthritis    "in q joint" (08/07/2013)  . CHF (congestive heart failure) (HCC)   . Coronary atherosclerosis of native coronary artery   . Cough   . Disorder of bone and cartilage, unspecified   . Edema 05/03/2013  . Exertional shortness of breath    "sometimes" (08/07/2013)  . First degree atrioventricular block   . Gout, unspecified 04/19/2013  . Hyperlipidemia 04/27/2007  . Hypertension   . Muscle spasm   . Muscle weakness (generalized)   . Myocardial infarction (HCC) ~ 1970  . Onychia and paronychia of toe   . Osteoarthrosis, unspecified whether generalized or localized, unspecified site 04/27/2007  . Other fall   . Other malaise and fatigue   . Pneumonia 05/2018  . Prepatellar bursitis   . Seizures (HCC)   . Stroke (HCC) 01/17/2014  . TIA (transient ischemic attack)    "a few one summer" (08/07/2013)  . Type II diabetes mellitus (HCC)    "fasting 90-110s" (08/07/2013)  . Unspecified essential hypertension 08/08/2013  . Unspecified vitamin D deficiency     Past Surgical History:  Procedure Laterality Date  . ABDOMINAL HYSTERECTOMY  04/1980  .  APPENDECTOMY  1960  . CARDIAC CATHETERIZATION  2003  . CATARACT EXTRACTION W/ INTRAOCULAR LENS  IMPLANT, BILATERAL Bilateral ~ 2012  . JOINT REPLACEMENT    . REPLACEMENT TOTAL KNEE Right 09/2005  . RIGHT/LEFT HEART CATH AND CORONARY ANGIOGRAPHY N/A 06/01/2018   Procedure: RIGHT/LEFT HEART CATH AND CORONARY ANGIOGRAPHY;  Surgeon: Corky Crafts, MD;  Location: [facility] INVASIVE CV LAB;  Service: Cardiovascular;  Laterality: N/A;  . SHOULDER OPEN ROTATOR CUFF  REPAIR Right 07/1999  . TONSILLECTOMY  08/1974  . TOTAL KNEE ARTHROPLASTY Left 08/07/2013   Procedure: TOTAL KNEE ARTHROPLASTY- LEFT;  Surgeon: Dannielle Huh, MD;  Location: [facility] OR;  Service: Orthopedics;  Laterality: Left;  . TRANSTHORACIC ECHOCARDIOGRAM  2003   EF 55-65%; mild concentric LVH     reports that she has never smoked. She has never used smokeless tobacco. She reports that she does not drink alcohol or use drugs. Social History   Socioeconomic History  . Marital status: Widowed    Spouse name: Not on file  . Number of children: Not on file  . Years of education: Not on file  . Highest education level: Not on file  Occupational History  . Not on file  Social Needs  . Financial resource strain: Not on file  . Food insecurity:    Worry: Not on file    Inability: Not on file  . Transportation needs:    Medical: Not on file    Non-medical: Not on file  Tobacco Use  . Smoking status: Never Smoker  . Smokeless tobacco: Never Used  Substance and Sexual Activity  . Alcohol use: No    Alcohol/week: 0.0 standard drinks  . Drug use: No  . Sexual activity: Never  Lifestyle  . Physical activity:    Days per week: Not on file    Minutes per session: Not on file  . Stress: Not on file  Relationships  . Social connections:    Talks on phone: Not on file    Gets together: Not on file    Attends religious service: Not on file    Active member of club or organization: Not on file    Attends meetings of clubs or organizations: Not on file    Relationship status: Not on file  . Intimate partner violence:    Fear of current or ex partner: Not on file    Emotionally abused: Not on file    Physically abused: Not on file    Forced sexual activity: Not on file  Other Topics Concern  . Not on file  Social History Narrative   Widowed   Walks with cane    Pertinent  Health Maintenance Due  Topic Date Due  . INFLUENZA VACCINE  06/09/2018  . FOOT EXAM  08/08/2018 (Originally  01/14/2018)  . HEMOGLOBIN A1C  08/08/2018 (Originally 05/26/2018)  . OPHTHALMOLOGY EXAM  08/08/2018 (Originally 09/12/2015)  . PNA vac Low Risk Adult (2 of 2 - PCV13) 08/08/2018 (Originally 09/11/2016)  . DEXA SCAN  Completed    Medications: Allergies as of 06/16/2018      Reactions   Clonidine Derivatives Swelling   [name]   Fish Allergy Diarrhea, Swelling, Other (See Comments)   Turns skin "black," but can tolerate white fish Salmon- Diarrhea   Shellfish Allergy Hives   Doxycycline Rash   Indomethacin    Reaction not recalled by the patient   Lyrica [pregabalin]    Hallucinations  Methyldopa    Aldomet (for hypertension): Reaction not recalled by the patient   Tyrone Nine [citrus] Other (See Comments)   Indigestion/heartburn   Cetirizine Hcl Itching, Rash   Codeine Itching   Levaquin [levofloxacin In D5w] Rash   Tomato Rash      Medication List        Accurate as of 06/16/18 11:59 PM. Always use your most recent med list.          albuterol 108 (90 Base) MCG/ACT inhaler Commonly known as:  PROVENTIL HFA;VENTOLIN HFA Inhale 1 puff into the lungs every 6 (six) hours as needed for wheezing or shortness of breath.   allopurinol 100 MG tablet Commonly known as:  ZYLOPRIM Take 100 mg by mouth daily. TAKE 2 TABLETS (200MG ) BY MOUTH ONCE DAILY   aspirin EC 81 MG tablet Take 81 mg by mouth daily.   calcium-vitamin D 500-200 MG-UNIT tablet Commonly known as:  OSCAL WITH D Take 1 tablet by mouth daily.   carvedilol 25 MG tablet Commonly known as:  COREG Take 1 tablet (25 mg total) by mouth 2 (two) times daily with a meal.   diclofenac sodium 1 % Gel Commonly known as:  VOLTAREN Apply 1 application topically daily.   ferrous sulfate 325 (65 FE) MG tablet Take 1 tablet (325 mg total) by mouth 2 (two) times daily with a meal.   furosemide 20 MG tablet Commonly known as:  LASIX Take 1 tablet (20  mg total) by mouth daily.   gabapentin 100 MG capsule Commonly known as:  NEURONTIN Take 1 capsule (100 mg total) by mouth at bedtime.   hydrALAZINE 100 MG tablet Commonly known as:  APRESOLINE TAKE 1 TABLET BY MOUTH THREE TIMES DAILY   hydroxychloroquine 200 MG tablet Commonly known as:  PLAQUENIL Take 200 mg by mouth 2 (two) times daily.   hydroxypropyl methylcellulose / hypromellose 2.5 % ophthalmic solution Commonly known as:  ISOPTO TEARS / GONIOVISC Place 1 drop into both eyes 4 (four) times daily as needed for dry eyes.   isosorbide mononitrate 30 MG 24 hr tablet Commonly known as:  IMDUR Take 1 tablet (30 mg total) by mouth daily.   leflunomide 20 MG tablet Commonly known as:  ARAVA Take 20 mg by mouth daily.   metFORMIN 500 MG tablet Commonly known as:  GLUCOPHAGE Take 1 tablet (500 mg total) by mouth daily with breakfast.   Olopatadine HCl 0.2 % Soln Apply 1 drop to eye daily.   potassium chloride SA 20 MEQ tablet Commonly known as:  K-DUR,KLOR-CON Take 1 tablet (20 mEq total) by mouth daily for 7 days.   predniSONE 10 MG tablet Commonly known as:  DELTASONE Take 1 tablet (10 mg total) by mouth daily with breakfast.   spironolactone 25 MG tablet Commonly known as:  ALDACTONE Take 0.5 tablets (12.5 mg total) by mouth daily.   triamcinolone cream 0.1 % Commonly known as:  KENALOG Apply 1 application topically 2 (two) times daily.        Vitals:   06/16/18 1356  BP: (!) 168/92  Pulse: 81  Resp: 19  Temp: (!) 97 F (36.1 C)  Weight: 183 lb (83 kg)  Height: 5\' 4"  (1.626 m)   Body mass index is 31.41 kg/m.  Physical Exam  GENERAL APPEARANCE: Alert, conversant. No acute distress.  HEENT: Unremarkable. RESPIRATORY: Breathing is even, unlabored. Lung sounds are clear   CARDIOVASCULAR: Heart RRR no murmurs, rubs or gallops. No peripheral edema.  GASTROINTESTINAL: Abdomen  is soft, non-tender, not distended w/ normal bowel sounds.  NEUROLOGIC:  Cranial nerves 2-12 grossly intact. Moves all extremities   Labs reviewed: Basic Metabolic Panel: Recent Labs    11/28/17 0809  05/29/18 0030  05/30/18 0449  06/01/18 0459  06/03/18 0957 06/04/18 0419 06/08/18 06/15/18 1122  NA 134*   < >  --    < > 143   < > 141  140   < > 135 138 134* 138  K 4.5   < >  --    < > 4.1   < > 3.5  3.5   < > 3.7 3.6 4.7 4.3  CL 107   < >  --    < > 108   < > 108  107   < > 102 104  --  106  CO2 21*   < >  --    < > 25   < > 26  27   < > 24 24  --  19*  GLUCOSE 132*   < >  --    < > 94   < > 109*  110*   < > 136* 108*  --  99  BUN 33*   < >  --    < > 24*   < > 22  21   < > 16 22 17  32*  CREATININE 1.05*   < >  --    < > 1.31*   < > 1.00  0.98   < > 1.09* 1.27* 1.1 1.27*  CALCIUM 8.7*   < >  --    < > 8.7*   < > 8.9  8.9   < > 8.9 8.9  --  9.6  MG 2.7*  --  1.7  --  1.7  --   --   --   --   --   --   --   PHOS  --   --   --   --   --   --  2.7  --   --   --   --   --    < > = values in this interval not displayed.   No results found for: [facility] Liver Function Tests: Recent Labs    02/02/18 0212 02/14/18 1032 05/28/18 1613 06/01/18 0459  AST 17 16 23   --   ALT 11* 12 20  --   ALKPHOS 44 46 81  --   BILITOT 0.4 0.5 0.6  --   PROT 6.0* 6.9 6.8  --   ALBUMIN 3.3* 3.8 3.3* 3.0*   No results for input(s): LIPASE, AMYLASE in the last 8760 hours. No results for input(s): AMMONIA in the last 8760 hours. CBC: Recent Labs    02/14/18 1032  05/28/18 1900  05/30/18 0449 06/01/18 0459 06/01/18 1657 06/08/18  WBC 12.1*   < > 9.7  --  11.3* 10.6* 10.0 7.3  NEUTROABS 10.3*  --  7.5  --   --  7.4  --   --   HGB 11.6*   < > 9.8*  --  9.8* 9.7* 10.0* 10.5*  HCT 34.7*   < > 30.1*   < > 30.8* 30.1* 31.1* 32*  MCV 97.2   < > 92.6  --  94.5 93.8 92.8  --   PLT 450.0*   < > 368  --  370 409* 366 415*   < > = values in this interval not displayed.  Lipid Recent Labs    05/29/18 0030  CHOL 178  HDL 73  LDLCALC 92  TRIG 64   Cardiac  Enzymes: Recent Labs    05/29/18 0030 05/29/18 0707 05/29/18 1128  TROPONINI 0.06* 0.05* 0.06*   BNP: Recent Labs    05/28/18 1614  BNP 2,077.6*   CBG: Recent Labs    06/04/18 0718 06/04/18 1158 06/04/18 1653  GLUCAP 107* 176* 154*    Procedures and Imaging Studies During Stay: Dg Chest 2 View  Result Date: 05/28/2018 CLINICAL DATA:  Weakness 6 days with intermittent altered mental status. EXAM: CHEST - 2 VIEW COMPARISON:  11/16/2017 FINDINGS: Lungs are adequately inflated demonstrate airspace consolidation over the left midlung likely left upper lobe pneumonia. No evidence of effusion. Slight worsening moderate cardiomegaly. Remainder of the exam is unchanged IMPRESSION: Mild airspace opacification over the left upper lobe likely pneumonia. Slight worsening moderate cardiomegaly. Electronically Signed   By: Elberta Fortis [name]   On: 05/28/2018 16:10   Ct Head Wo Contrast  Result Date: 05/29/2018 CLINICAL DATA:  Drowsiness with RIGHT facial droop. EXAM: CT HEAD WITHOUT CONTRAST TECHNIQUE: Contiguous axial images were obtained from the base of the skull through the vertex without intravenous contrast. COMPARISON:  05/28/2018. FINDINGS: Brain: No evidence for acute infarction, hemorrhage, mass lesion, hydrocephalus, or extra-axial fluid. Generalized atrophy. Moderate small vessel disease. Vascular: Calcification of the cavernous internal carotid arteries consistent with cerebrovascular atherosclerotic disease. No signs of intracranial large vessel occlusion. Skull: Calvarium intact. Sinuses/Orbits: Clear sinuses.  No orbital findings. Other: No mastoid fluid. IMPRESSION: Atrophy and white matter disease, stable in appearance from 05/28/2018. No acute intracranial findings. Electronically Signed   By: Elsie Stain [name]   On: 05/29/2018 21:44   Ct Head Wo Contrast  Result Date: 05/28/2018 CLINICAL DATA:  Generalized weakness since 05/22/2018. Intermittent altered mental status. EXAM: CT  HEAD WITHOUT CONTRAST TECHNIQUE: Contiguous axial images were obtained from the base of the skull through the vertex without intravenous contrast. COMPARISON:  11/29/2016. FINDINGS: Brain: Diffusely enlarged ventricles and subarachnoid spaces. Patchy white matter low density in both cerebral hemispheres. No intracranial hemorrhage, mass lesion or CT evidence of acute infarction. Vascular: No hyperdense vessel or unexpected calcification. Skull: Bilateral hyperostosis frontalis. Sinuses/Orbits: Status post bilateral cataract extraction. Single residual opacified left ethmoid air cell. Resolved right mastoid air cell opacification. Other: None. IMPRESSION: 1. No acute abnormality. 2. Stable atrophy. 3. Progressive chronic small vessel white matter ischemic changes in both cerebral hemispheres. 4. Improved left ethmoid sinusitis. 5. Resolved right mastoid mucosal thickening and fluid. Electronically Signed   By: Beckie Salts [name]   On: 05/28/2018 15:56   Ct Angio Chest Pe W And/or Wo Contrast  Result Date: 05/28/2018 CLINICAL DATA:  Shortness of breath. EXAM: CT ANGIOGRAPHY CHEST WITH CONTRAST TECHNIQUE: Multidetector CT imaging of the chest was performed using the standard protocol during bolus administration of intravenous contrast. Multiplanar CT image reconstructions and MIPs were obtained to evaluate the vascular anatomy. CONTRAST:  100 mL ISOVUE-370 IOPAMIDOL (ISOVUE-370) INJECTION 76% COMPARISON:  Chest CT angiogram September 11, 2015; chest radiograph May 28, 2018 FINDINGS: Cardiovascular: There is no demonstrable pulmonary embolus. There is no thoracic aortic aneurysm. No dissection is seen in the thoracic aorta. Note that the contrast bolus in the aorta is less than optimal for assessment for potential dissection. Visualized great vessels appear unremarkable except for slight calcification in the left common carotid artery. Note that the left common carotid and right innominate arteries arise as a common  trunk, an anatomic variant. There is no appreciable pericardial effusion or pericardial thickening. There is aortic atherosclerosis. There are foci of coronary artery calcification. Mediastinum/Nodes: There is a subcentimeter nodular opacity in the left lobe of the thyroid. No dominant thyroid mass evident. There is no evident thoracic adenopathy. No esophageal lesions are appreciable. Lungs/Pleura: There is airspace consolidation in the anterior segment left upper lobe consistent with pneumonia. There is atelectatic change in each lower lobe with a small right pleural effusion evident. On axial slice 69 series 11, there is a 5 mm nodular opacity in the lateral segment of the right middle lobe. There is a nodular opacity in the anterior segment of the right upper lobe measuring 1.5 x 1.1 cm. This nodular opacity is best seen on coronal slice 88 series 7 but is also appreciable on sagittal slice 61 series 8 and axial slice 23 series 11. Upper Abdomen: In the visualized upper abdominal region, there is mild reflux into the inferior vena cava and hepatic veins suggesting increase in right heart pressure. There is atherosclerotic calcification in the aorta and proximal major mesenteric arterial vessels. There is a cyst arising from the lateral upper pole right kidney measuring 5.2 x 5.1 cm. Musculoskeletal: There is degenerative change in the thoracic spine. There are no blastic or lytic bone lesions. There is extensive arthropathy in each shoulder. No evident chest wall lesions. Review of the MIP images confirms the above findings. IMPRESSION: 1. There is a 1.5 x 1.1 cm nodular opacity in the anterior segment of the right upper lobe. Consider one of the following in 3 months for both low-risk and high-risk individuals: (a) repeat chest CT, (b) follow-up PET-CT, or (c) tissue sampling. This recommendation follows the consensus statement: Guidelines for Management of Incidental Pulmonary Nodules Detected on CT Images:  From the Fleischner Society 2017; Radiology 2017; 284:228-243. 2. Anterior segment left upper lobe airspace consolidation consistent with pneumonia. 3. No demonstrable pulmonary embolus. No thoracic aortic aneurysm. No dissection seen. It should be noted that the contrast bolus in the aorta is insufficient to exclude dissection confidently as a differential consideration by radiography. 4. There is aortic atherosclerosis. There are foci of coronary artery calcification. There are foci of great vessel and mesenteric arterial vessel calcification as well. 5.  Small right pleural effusion with bibasilar atelectasis. 6. Subcentimeter left thyroid nodule. Per consensus guidelines, this lesion does not warrant additional imaging surveillance. 7.  No demonstrable thoracic adenopathy. 8. Reflux into the inferior vena cava and hepatic veins may be indicative of a degree of increase in right heart pressure. Aortic Atherosclerosis (ICD10-I70.0). Electronically Signed   By: Bretta Bang III [name]   On: 05/28/2018 19:08   Dg Chest Port 1 View  Result Date: 06/01/2018 CLINICAL DATA:  Recent pneumonia EXAM: PORTABLE CHEST 1 VIEW COMPARISON:  Chest radiograph and chest CT May 28, 2018 FINDINGS: There has been interval clearing of infiltrate from the left upper lobe. Mild atelectatic change remains in this area. There is no new opacity elsewhere. There is cardiomegaly with mild pulmonary venous hypertension. No adenopathy. There is advanced arthropathy in both shoulders. There is postoperative change in the right shoulder region. IMPRESSION: Interval clearing of infiltrate from left upper lobe. Mild atelectasis remains in this area. No new opacity. There is a degree of underlying pulmonary vascular congestion. There is advanced arthropathy in both shoulders with evidence suggesting avascular necrosis in each humeral head. Note that the nodular opacity in the right upper lobe seen on recent chest  CT is not appreciable by  radiography. Please see recommendations with respect to recent chest CT report. Electronically Signed   By: Bretta Bang III [name]   On: 06/01/2018 07:09    Assessment/Plan:   Acute on chronic combined systolic and diastolic congestive heart failure (HCC)  Uncontrolled hypertension  Acute metabolic encephalopathy  Hypertensive urgency  Pneumonia of left upper lobe due to infectious organism [facility])  Acute kidney injury superimposed on chronic kidney disease (HCC)  Idiopathic chronic gout of multiple sites with tophus  Rheumatoid arthritis involving multiple sites with positive rheumatoid factor (HCC)  Idiopathic chronic gout of multiple sites without tophus  Type 2 diabetes mellitus with stage 3 chronic kidney disease, without long-term current use of insulin (HCC)   Patient is being discharged with the following home health services: OT/PT/nursing  Patient is being discharged with the following durable medical equipment: None  Patient has been advised to f/u with their PCP in 1-2 weeks to bring them up to date on their rehab stay.  Social services at facility was responsible for arranging this appointment.  Pt was provided with a 30 day supply of prescriptions for medications and refills must be obtained from their PCP.  For controlled substances, a more limited supply may be provided adequate until PCP appointment only.  Medications have been reconciled.  Time spent greater than 30 minutes;> 50% of time with patient was spent reviewing records, labs, tests and studies, counseling and developing plan of care  [name] [name]. Lyn Hollingshead, MD

## 2018-06-17 ENCOUNTER — Telehealth: Payer: Self-pay | Admitting: Cardiovascular Disease

## 2018-06-17 ENCOUNTER — Telehealth: Payer: Self-pay | Admitting: Family Medicine

## 2018-06-17 DIAGNOSIS — Z79899 Other long term (current) drug therapy: Secondary | ICD-10-CM

## 2018-06-17 DIAGNOSIS — N289 Disorder of kidney and ureter, unspecified: Secondary | ICD-10-CM

## 2018-06-17 DIAGNOSIS — I5032 Chronic diastolic (congestive) heart failure: Secondary | ICD-10-CM

## 2018-06-17 NOTE — Telephone Encounter (Signed)
SEE RESULT NOTE 

## 2018-06-17 NOTE — Telephone Encounter (Signed)
FYI

## 2018-06-17 NOTE — Telephone Encounter (Signed)
Spoke with the patients daughter, Verlon Au, on [place]... And advised her the pts lab results.. She is being discharged from the hospital today and will add some extra fluid to her diet at home. She is seeing her PMD, Dr. Earlene Plater, next week.. 8/16 and will have her bmet rechecked at that office visit. She will also keep her 9/19 appt with Dr. Duke Salvia but will call our office if she has any problems or questions.

## 2018-06-17 NOTE — Telephone Encounter (Signed)
Copied from CRM [id number]. Topic: Quick Communication - See Telephone Encounter >> Jun 17, 2018  2:43 PM Herby Abraham C wrote: CRM for notification. See Telephone encounter for: 06/17/18.  Ketha with [facility] called in to let provider know that they received the referral and will start home health services on Monday.   [phone]

## 2018-06-17 NOTE — Telephone Encounter (Signed)
New Message:     Pt's daughter is calling to get results of labs.

## 2018-06-18 ENCOUNTER — Encounter: Payer: Self-pay | Admitting: Internal Medicine

## 2018-06-18 DIAGNOSIS — G9341 Metabolic encephalopathy: Secondary | ICD-10-CM | POA: Insufficient documentation

## 2018-06-18 DIAGNOSIS — N179 Acute kidney failure, unspecified: Secondary | ICD-10-CM | POA: Insufficient documentation

## 2018-06-18 DIAGNOSIS — I10 Essential (primary) hypertension: Secondary | ICD-10-CM | POA: Insufficient documentation

## 2018-06-18 DIAGNOSIS — N189 Chronic kidney disease, unspecified: Secondary | ICD-10-CM | POA: Insufficient documentation

## 2018-06-19 DIAGNOSIS — E1151 Type 2 diabetes mellitus with diabetic peripheral angiopathy without gangrene: Secondary | ICD-10-CM | POA: Diagnosis not present

## 2018-06-19 DIAGNOSIS — M1991 Primary osteoarthritis, unspecified site: Secondary | ICD-10-CM | POA: Diagnosis not present

## 2018-06-19 DIAGNOSIS — E1122 Type 2 diabetes mellitus with diabetic chronic kidney disease: Secondary | ICD-10-CM | POA: Diagnosis not present

## 2018-06-19 DIAGNOSIS — Z8673 Personal history of transient ischemic attack (TIA), and cerebral infarction without residual deficits: Secondary | ICD-10-CM | POA: Diagnosis not present

## 2018-06-19 DIAGNOSIS — N183 Chronic kidney disease, stage 3 (moderate): Secondary | ICD-10-CM | POA: Diagnosis not present

## 2018-06-19 DIAGNOSIS — M1A09X0 Idiopathic chronic gout, multiple sites, without tophus (tophi): Secondary | ICD-10-CM | POA: Diagnosis not present

## 2018-06-19 DIAGNOSIS — I252 Old myocardial infarction: Secondary | ICD-10-CM | POA: Diagnosis not present

## 2018-06-19 DIAGNOSIS — Z7952 Long term (current) use of systemic steroids: Secondary | ICD-10-CM | POA: Diagnosis not present

## 2018-06-19 DIAGNOSIS — M069 Rheumatoid arthritis, unspecified: Secondary | ICD-10-CM | POA: Diagnosis not present

## 2018-06-19 DIAGNOSIS — D631 Anemia in chronic kidney disease: Secondary | ICD-10-CM | POA: Diagnosis not present

## 2018-06-19 DIAGNOSIS — I270 Primary pulmonary hypertension: Secondary | ICD-10-CM | POA: Diagnosis not present

## 2018-06-19 DIAGNOSIS — I130 Hypertensive heart and chronic kidney disease with heart failure and stage 1 through stage 4 chronic kidney disease, or unspecified chronic kidney disease: Secondary | ICD-10-CM | POA: Diagnosis not present

## 2018-06-19 DIAGNOSIS — Z7982 Long term (current) use of aspirin: Secondary | ICD-10-CM | POA: Diagnosis not present

## 2018-06-19 DIAGNOSIS — Z96653 Presence of artificial knee joint, bilateral: Secondary | ICD-10-CM | POA: Diagnosis not present

## 2018-06-19 DIAGNOSIS — I2510 Atherosclerotic heart disease of native coronary artery without angina pectoris: Secondary | ICD-10-CM | POA: Diagnosis not present

## 2018-06-19 DIAGNOSIS — I5043 Acute on chronic combined systolic (congestive) and diastolic (congestive) heart failure: Secondary | ICD-10-CM | POA: Diagnosis not present

## 2018-06-20 ENCOUNTER — Telehealth: Payer: Self-pay | Admitting: Family Medicine

## 2018-06-20 ENCOUNTER — Telehealth: Payer: Self-pay | Admitting: Cardiovascular Disease

## 2018-06-20 DIAGNOSIS — I5043 Acute on chronic combined systolic (congestive) and diastolic (congestive) heart failure: Secondary | ICD-10-CM | POA: Diagnosis not present

## 2018-06-20 DIAGNOSIS — I252 Old myocardial infarction: Secondary | ICD-10-CM | POA: Diagnosis not present

## 2018-06-20 DIAGNOSIS — I2510 Atherosclerotic heart disease of native coronary artery without angina pectoris: Secondary | ICD-10-CM | POA: Diagnosis not present

## 2018-06-20 DIAGNOSIS — Z8673 Personal history of transient ischemic attack (TIA), and cerebral infarction without residual deficits: Secondary | ICD-10-CM | POA: Diagnosis not present

## 2018-06-20 DIAGNOSIS — E1122 Type 2 diabetes mellitus with diabetic chronic kidney disease: Secondary | ICD-10-CM | POA: Diagnosis not present

## 2018-06-20 DIAGNOSIS — M1A09X0 Idiopathic chronic gout, multiple sites, without tophus (tophi): Secondary | ICD-10-CM | POA: Diagnosis not present

## 2018-06-20 DIAGNOSIS — E1151 Type 2 diabetes mellitus with diabetic peripheral angiopathy without gangrene: Secondary | ICD-10-CM | POA: Diagnosis not present

## 2018-06-20 DIAGNOSIS — Z96653 Presence of artificial knee joint, bilateral: Secondary | ICD-10-CM | POA: Diagnosis not present

## 2018-06-20 DIAGNOSIS — D631 Anemia in chronic kidney disease: Secondary | ICD-10-CM | POA: Diagnosis not present

## 2018-06-20 DIAGNOSIS — Z7952 Long term (current) use of systemic steroids: Secondary | ICD-10-CM | POA: Diagnosis not present

## 2018-06-20 DIAGNOSIS — N183 Chronic kidney disease, stage 3 (moderate): Secondary | ICD-10-CM | POA: Diagnosis not present

## 2018-06-20 DIAGNOSIS — M1991 Primary osteoarthritis, unspecified site: Secondary | ICD-10-CM | POA: Diagnosis not present

## 2018-06-20 DIAGNOSIS — Z7982 Long term (current) use of aspirin: Secondary | ICD-10-CM | POA: Diagnosis not present

## 2018-06-20 DIAGNOSIS — M069 Rheumatoid arthritis, unspecified: Secondary | ICD-10-CM | POA: Diagnosis not present

## 2018-06-20 DIAGNOSIS — I130 Hypertensive heart and chronic kidney disease with heart failure and stage 1 through stage 4 chronic kidney disease, or unspecified chronic kidney disease: Secondary | ICD-10-CM | POA: Diagnosis not present

## 2018-06-20 DIAGNOSIS — I270 Primary pulmonary hypertension: Secondary | ICD-10-CM | POA: Diagnosis not present

## 2018-06-20 NOTE — Telephone Encounter (Signed)
Spoke with [place] @ [facility].  Arline Asp states that the patient has taken her medication today and her BP has gone down to 139/84. She still has a one day 3lb weight gain, but is asymptomatic.  Arline Asp sts that a nurse is scheduled to go out to the patient's home today and they will call our office if further assistance is needed.  FYI fwd to Dr.[place]

## 2018-06-20 NOTE — Telephone Encounter (Signed)
Returned call to [place] @ [facility]. Left a message on her voicemail for her to call back.

## 2018-06-20 NOTE — Telephone Encounter (Signed)
New problem    Pt's weight is up 3lbs this morning and BP is 199/130 and hasn't taken any medication. But has been told to take her medication now.Pt isn't symptomatic.

## 2018-06-20 NOTE — Telephone Encounter (Signed)
Please advise 

## 2018-06-20 NOTE — Telephone Encounter (Signed)
Follow up     [name] [name] with [facility] is returning call.

## 2018-06-20 NOTE — Telephone Encounter (Signed)
Can we get her in tomorrow? Okay extra Lasix dose today.

## 2018-06-20 NOTE — Telephone Encounter (Signed)
See note.   Copied from CRM [id number]. Topic: Inquiry >> Jun 20, 2018 10:51 AM Yvonna Alanis wrote: Reason for CRM: Marchelle Folks with [facility] [phone]) called requesting verbal orders for patient to have Nursing Assistance for Congestive Heart Failure Management. Requested is Nursing Twice a Week for 3 Weeks and Once a Week for 5 Weeks. [name] family has also requested Speech, PT and OT. Please call Marchelle Folks with [facility] at [phone].       Thank You!!!

## 2018-06-20 NOTE — Telephone Encounter (Signed)
Okay for all 

## 2018-06-20 NOTE — Telephone Encounter (Signed)
Copied from CRM [id number]. Topic: Inquiry >> Jun 20, 2018 12:40 PM Crist Infante wrote: Reason for CRM: Vonna Kotyk nurse with [facility] calling to advise pt's bp is 170/98.  Pt has gained almost 4 lbs since 8/11.  (yesterday) Pt took both bp meds and lasix at 9:30 am today   Ankles are 24 in and 25.5 in the right.  The calves 33.5. Same as yesterday If any med changes, please call pt directly, as he will be leaving.  Pt has a wrist bp cuff he does not feel is accurate

## 2018-06-20 NOTE — Telephone Encounter (Signed)
See note

## 2018-06-21 ENCOUNTER — Telehealth: Payer: Self-pay | Admitting: Family Medicine

## 2018-06-21 ENCOUNTER — Ambulatory Visit: Payer: Medicare Other | Admitting: Family Medicine

## 2018-06-21 ENCOUNTER — Telehealth: Payer: Self-pay

## 2018-06-21 DIAGNOSIS — I270 Primary pulmonary hypertension: Secondary | ICD-10-CM | POA: Diagnosis not present

## 2018-06-21 DIAGNOSIS — I5043 Acute on chronic combined systolic (congestive) and diastolic (congestive) heart failure: Secondary | ICD-10-CM | POA: Diagnosis not present

## 2018-06-21 DIAGNOSIS — M069 Rheumatoid arthritis, unspecified: Secondary | ICD-10-CM | POA: Diagnosis not present

## 2018-06-21 DIAGNOSIS — E1122 Type 2 diabetes mellitus with diabetic chronic kidney disease: Secondary | ICD-10-CM | POA: Diagnosis not present

## 2018-06-21 DIAGNOSIS — Z8673 Personal history of transient ischemic attack (TIA), and cerebral infarction without residual deficits: Secondary | ICD-10-CM | POA: Diagnosis not present

## 2018-06-21 DIAGNOSIS — N183 Chronic kidney disease, stage 3 (moderate): Secondary | ICD-10-CM | POA: Diagnosis not present

## 2018-06-21 DIAGNOSIS — Z96653 Presence of artificial knee joint, bilateral: Secondary | ICD-10-CM | POA: Diagnosis not present

## 2018-06-21 DIAGNOSIS — I2510 Atherosclerotic heart disease of native coronary artery without angina pectoris: Secondary | ICD-10-CM | POA: Diagnosis not present

## 2018-06-21 DIAGNOSIS — Z7952 Long term (current) use of systemic steroids: Secondary | ICD-10-CM | POA: Diagnosis not present

## 2018-06-21 DIAGNOSIS — D631 Anemia in chronic kidney disease: Secondary | ICD-10-CM | POA: Diagnosis not present

## 2018-06-21 DIAGNOSIS — M1991 Primary osteoarthritis, unspecified site: Secondary | ICD-10-CM | POA: Diagnosis not present

## 2018-06-21 DIAGNOSIS — I252 Old myocardial infarction: Secondary | ICD-10-CM | POA: Diagnosis not present

## 2018-06-21 DIAGNOSIS — M1A09X0 Idiopathic chronic gout, multiple sites, without tophus (tophi): Secondary | ICD-10-CM | POA: Diagnosis not present

## 2018-06-21 DIAGNOSIS — I130 Hypertensive heart and chronic kidney disease with heart failure and stage 1 through stage 4 chronic kidney disease, or unspecified chronic kidney disease: Secondary | ICD-10-CM | POA: Diagnosis not present

## 2018-06-21 DIAGNOSIS — Z7982 Long term (current) use of aspirin: Secondary | ICD-10-CM | POA: Diagnosis not present

## 2018-06-21 DIAGNOSIS — E1151 Type 2 diabetes mellitus with diabetic peripheral angiopathy without gangrene: Secondary | ICD-10-CM | POA: Diagnosis not present

## 2018-06-21 NOTE — Telephone Encounter (Signed)
Called and informed office we are going to hold off on order per phone call in chart patient is going to ED to be placed back in rehab.

## 2018-06-21 NOTE — Telephone Encounter (Signed)
Attempted to contact patient, left voicemail requesting call back. 4:00 appt on hold for patient if she calls back and would like to come in.

## 2018-06-21 NOTE — Progress Notes (Deleted)
[name] [name] is a 84 y.o. female is here for follow up.  History of Present Illness:   HPI: 84 y.o. female with CVA, hypertension, diabetes, hyperlipidemia, congestive heart failure, chronic kidney disease, RA, anemia, and seizure who presented to [facility] emergency department with onset of weakness patient was admitted to [facility] long hospital from 7/20-27 where she was treated for acute on chronic congestive heart failure and hypertensive urgency, as well as acute metabolic encephalopathy, left upper lobe pneumonia, and acute kidney injury.  The above were resolved and patient was admitted to skilled nursing facility for OT/PT and was DC's on 06/16/18.  Note patient had a right upper lobe nodule noted during hospitalization which will need following up in 43-month by modality of choice.    Health Maintenance Due  Topic Date Due  . INFLUENZA VACCINE  06/09/2018   Depression screen [facility] 2/9 06/28/2017 03/29/2017 03/04/2016  Decreased Interest 0 0 0  Down, Depressed, Hopeless 0 0 0  PHQ - 2 Score 0 0 0  Some recent data might be hidden   PMHx, SurgHx, SocialHx, FamHx, Medications, and Allergies were reviewed in the Visit Navigator and updated as appropriate.   Patient Active Problem List   Diagnosis Date Noted  . Acute on chronic combined systolic and diastolic congestive heart failure (HCC) 09/12/2015    Priority: High  . CKD stage 3 due to type 2 diabetes mellitus (HCC) 06/05/2014    Priority: High  . DM (diabetes mellitus), type 2 with renal complications (HCC) 06/05/2014    Priority: High    Class: Chronic  . Rheumatoid arthritis involving multiple sites (HCC) 12/19/2013    Priority: High    Class: Chronic  . Uncontrolled hypertension 06/18/2018  . Acute metabolic encephalopathy 06/18/2018  . Acute kidney injury superimposed on chronic kidney disease (HCC) 06/18/2018  . Pneumonia of left upper lobe due to infectious organism (HCC)   . Elevated troponin   . DCM (dilated  cardiomyopathy) (HCC)   . Benign hypertensive heart and kidney disease with CHF and stage 3 chronic kidney disease (HCC) 05/28/2018  . Degenerative joint disease involving multiple joints 05/06/2018  . Adhesive capsulitis of right shoulder 10/12/2017  . Idiopathic chronic gout of multiple sites with tophus 08/25/2017  . Rheumatic nodule 06/28/2017  . History of total knee arthroplasty, bilateral 02/18/2017  . History of rotator cuff surgery 02/18/2017  . Obesity (BMI 30.0-34.9) 12/08/2016  . High risk medications (not anticoagulants) long-term use 09/21/2016  . Hammer toe 10/31/2015  . Midline low back pain without sciatica 06/27/2014  . Bilateral edema of lower extremity 06/05/2014  . Anemia of chronic disease 05/01/2014  . Fluctuating blood pressure 02/06/2014  . Stroke (HCC) 01/17/2014  . CAD (coronary artery disease) 10/18/2013  . Fibromyalgia 05/10/2013  . Reactive airway disease 04/19/2013  . Hyperlipidemia 04/27/2007  . Allergic rhinitis 04/27/2007  . Diverticulosis 04/27/2007   Social History   Tobacco Use  . Smoking status: Never Smoker  . Smokeless tobacco: Never Used  Substance Use Topics  . Alcohol use: No    Alcohol/week: 0.0 standard drinks  . Drug use: No   Current Medications and Allergies:   Current Outpatient Medications:  .  albuterol (PROVENTIL HFA;VENTOLIN HFA) 108 (90 Base) MCG/ACT inhaler, Inhale 1 puff into the lungs every 6 (six) hours as needed for wheezing or shortness of breath., Disp: , Rfl:  .  allopurinol (ZYLOPRIM) 100 MG tablet, Take 100 mg by mouth daily. TAKE 2 TABLETS (200MG ) BY MOUTH  ONCE DAILY, Disp: , Rfl:  .  aspirin EC 81 MG tablet, Take 81 mg by mouth daily., Disp: , Rfl:  .  calcium-vitamin D (OSCAL WITH D) 500-200 MG-UNIT per tablet, Take 1 tablet by mouth daily. , Disp: , Rfl:  .  carvedilol (COREG) 25 MG tablet, Take 1 tablet (25 mg total) by mouth 2 (two) times daily with a meal., Disp: 60 tablet, Rfl: 0 .  diclofenac sodium  (VOLTAREN) 1 % GEL, Apply 1 application topically daily., Disp: 100 g, Rfl: 4 .  ferrous sulfate 325 (65 FE) MG tablet, Take 1 tablet (325 mg total) by mouth 2 (two) times daily with a meal., Disp: 60 tablet, Rfl: 0 .  furosemide (LASIX) 20 MG tablet, Take 1 tablet (20 mg total) by mouth daily., Disp: 30 tablet, Rfl: 0 .  gabapentin (NEURONTIN) 100 MG capsule, Take 1 capsule (100 mg total) by mouth at bedtime., Disp: 30 capsule, Rfl: 3 .  hydrALAZINE (APRESOLINE) 100 MG tablet, TAKE 1 TABLET BY MOUTH THREE TIMES DAILY, Disp: 90 tablet, Rfl: 2 .  hydroxychloroquine (PLAQUENIL) 200 MG tablet, Take 200 mg by mouth 2 (two) times daily., Disp: , Rfl:  .  hydroxypropyl methylcellulose / hypromellose (ISOPTO TEARS / GONIOVISC) 2.5 % ophthalmic solution, Place 1 drop into both eyes 4 (four) times daily as needed for dry eyes., Disp: 15 mL, Rfl: 2 .  isosorbide mononitrate (IMDUR) 30 MG 24 hr tablet, Take 1 tablet (30 mg total) by mouth daily., Disp: 30 tablet, Rfl: 0 .  leflunomide (ARAVA) 20 MG tablet, Take 20 mg by mouth daily. , Disp: , Rfl:  .  metFORMIN (GLUCOPHAGE) 500 MG tablet, Take 1 tablet (500 mg total) by mouth daily with breakfast., Disp: 90 tablet, Rfl: 2 .  Olopatadine HCl 0.2 % SOLN, Apply 1 drop to eye daily., Disp: 2.5 mL, Rfl: 3 .  potassium chloride SA (K-DUR,KLOR-CON) 20 MEQ tablet, Take 1 tablet (20 mEq total) by mouth daily for 7 days., Disp: 7 tablet, Rfl: 0 .  predniSONE (DELTASONE) 10 MG tablet, Take 1 tablet (10 mg total) by mouth daily with breakfast., Disp: 5 tablet, Rfl: 0 .  spironolactone (ALDACTONE) 25 MG tablet, Take 0.5 tablets (12.5 mg total) by mouth daily., Disp: 30 tablet, Rfl: 0 .  triamcinolone cream (KENALOG) 0.1 %, Apply 1 application topically 2 (two) times daily., Disp: , Rfl:   Allergies  Allergen Reactions  . Clonidine Derivatives Swelling    [name]  . Fish Allergy Diarrhea, Swelling  and Other (See Comments)    Turns skin "black," but can tolerate white fish Salmon- Diarrhea  . Shellfish Allergy Hives  . Doxycycline Rash  . Indomethacin     Reaction not recalled by the patient  . Lyrica [Pregabalin]     Hallucinations   . Methyldopa     Aldomet (for hypertension): Reaction not recalled by the patient  . Orange Fruit [Citrus] Other (See Comments)    Indigestion/heartburn  . Cetirizine Hcl Itching and Rash  . Codeine Itching  . Levaquin [Levofloxacin In D5w] Rash  . Tomato Rash   Review of Systems   Pertinent items are noted in the HPI. Otherwise, ROS is negative.  Vitals:  There were no vitals filed for this visit.   There is no height or weight on file to calculate BMI.  Physical Exam:   Physical Exam  Results for orders placed or performed in visit on 06/15/18  Basic  metabolic panel  Result Value Ref Range   Glucose 99 65 - 99 mg/dL   BUN 32 (H) 8 - 27 mg/dL   Creatinine, Ser 4.19 (H) 0.57 - 1.00 mg/dL   GFR calc non Af Amer 38 (L) >59 mL/min/1.73   GFR calc Af Amer 44 (L) >59 mL/min/1.73   BUN/Creatinine Ratio 25 12 - 28   Sodium 138 134 - 144 mmol/L   Potassium 4.3 3.5 - 5.2 mmol/L   Chloride 106 96 - 106 mmol/L   CO2 19 (L) 20 - 29 mmol/L   Calcium 9.6 8.7 - 10.3 mg/dL    Assessment and Plan:   There are no diagnoses linked to this encounter.  . Reviewed expectations re: course of current medical issues. . Discussed self-management of symptoms. . Outlined signs and symptoms indicating need for more acute intervention. . Patient verbalized understanding and all questions were answered. Marland Kitchen Health Maintenance issues including appropriate healthy diet, exercise, and smoking avoidance were discussed with patient. . See orders for this visit as documented in the electronic medical record. . Patient received an After Visit Summary.  *** CMA served as Neurosurgeon during this visit. History, Physical, and Plan performed by medical provider. The  above documentation has been reviewed and is accurate and complete. Helane Rima, D.O.  Helane Rima, DO [place], [place] [facility] 06/21/2018

## 2018-06-21 NOTE — Telephone Encounter (Signed)
Patient is scheduled for today.  

## 2018-06-21 NOTE — Telephone Encounter (Signed)
Daughter called patient was to have appointment today with Dr. Earlene Plater. She does not feel she can get her out of bed. States that patient has gotten a lot worse over the last three days. She is so weak that they can not get her in the car. She is not eating and they are having a hard time keeping her awake. I advised daughter to EMS and have patient taken to ED. She agreed and will call. We have cancelled appointment and will not be charged for no show.

## 2018-06-21 NOTE — Telephone Encounter (Signed)
Copied from CRM [id number]. Topic: Quick Communication - See Telephone Encounter >> Jun 21, 2018  2:18 PM Terisa Starr wrote: CRM for notification. See Telephone encounter for: 06/21/18.  Flor, physical therapist with [facility] called to get verbals for physical therapy twice a week for 6 weeks Call back #[phone]

## 2018-06-21 NOTE — Telephone Encounter (Signed)
If her weight is up 3lb then she should take lasix 40mg  instead of 20mg  for the next two days.

## 2018-06-21 NOTE — Telephone Encounter (Signed)
Orders declined office informed patient on way to ED to be admitted in rehab

## 2018-06-22 ENCOUNTER — Other Ambulatory Visit: Payer: Self-pay

## 2018-06-22 ENCOUNTER — Emergency Department (HOSPITAL_COMMUNITY): Payer: Medicare Other

## 2018-06-22 ENCOUNTER — Inpatient Hospital Stay (HOSPITAL_COMMUNITY)
Admission: EM | Admit: 2018-06-22 | Discharge: 2018-06-27 | DRG: 417 | Disposition: A | Discharge status: Skilled Nursing Facility | Payer: Medicare Other | Attending: Family Medicine | Admitting: Family Medicine

## 2018-06-22 ENCOUNTER — Encounter (HOSPITAL_COMMUNITY): Payer: Self-pay | Admitting: *Deleted

## 2018-06-22 ENCOUNTER — Inpatient Hospital Stay: Payer: Medicare Other | Admitting: Family Medicine

## 2018-06-22 DIAGNOSIS — Z91013 Allergy to seafood: Secondary | ICD-10-CM | POA: Diagnosis not present

## 2018-06-22 DIAGNOSIS — I130 Hypertensive heart and chronic kidney disease with heart failure and stage 1 through stage 4 chronic kidney disease, or unspecified chronic kidney disease: Secondary | ICD-10-CM | POA: Diagnosis not present

## 2018-06-22 DIAGNOSIS — I5043 Acute on chronic combined systolic (congestive) and diastolic (congestive) heart failure: Secondary | ICD-10-CM | POA: Diagnosis not present

## 2018-06-22 DIAGNOSIS — Z885 Allergy status to narcotic agent status: Secondary | ICD-10-CM

## 2018-06-22 DIAGNOSIS — K819 Cholecystitis, unspecified: Secondary | ICD-10-CM

## 2018-06-22 DIAGNOSIS — E1129 Type 2 diabetes mellitus with other diabetic kidney complication: Secondary | ICD-10-CM | POA: Diagnosis present

## 2018-06-22 DIAGNOSIS — K851 Biliary acute pancreatitis without necrosis or infection: Secondary | ICD-10-CM | POA: Diagnosis not present

## 2018-06-22 DIAGNOSIS — R945 Abnormal results of liver function studies: Secondary | ICD-10-CM

## 2018-06-22 DIAGNOSIS — Z7982 Long term (current) use of aspirin: Secondary | ICD-10-CM

## 2018-06-22 DIAGNOSIS — M069 Rheumatoid arthritis, unspecified: Secondary | ICD-10-CM | POA: Diagnosis not present

## 2018-06-22 DIAGNOSIS — Z9049 Acquired absence of other specified parts of digestive tract: Secondary | ICD-10-CM

## 2018-06-22 DIAGNOSIS — I252 Old myocardial infarction: Secondary | ICD-10-CM

## 2018-06-22 DIAGNOSIS — I340 Nonrheumatic mitral (valve) insufficiency: Secondary | ICD-10-CM | POA: Diagnosis not present

## 2018-06-22 DIAGNOSIS — K8080 Other cholelithiasis without obstruction: Secondary | ICD-10-CM | POA: Diagnosis not present

## 2018-06-22 DIAGNOSIS — Z881 Allergy status to other antibiotic agents status: Secondary | ICD-10-CM

## 2018-06-22 DIAGNOSIS — E876 Hypokalemia: Secondary | ICD-10-CM | POA: Diagnosis not present

## 2018-06-22 DIAGNOSIS — N183 Chronic kidney disease, stage 3 unspecified: Secondary | ICD-10-CM | POA: Diagnosis present

## 2018-06-22 DIAGNOSIS — J811 Chronic pulmonary edema: Secondary | ICD-10-CM | POA: Diagnosis not present

## 2018-06-22 DIAGNOSIS — I10 Essential (primary) hypertension: Secondary | ICD-10-CM | POA: Diagnosis not present

## 2018-06-22 DIAGNOSIS — E1122 Type 2 diabetes mellitus with diabetic chronic kidney disease: Secondary | ICD-10-CM | POA: Diagnosis present

## 2018-06-22 DIAGNOSIS — Z96653 Presence of artificial knee joint, bilateral: Secondary | ICD-10-CM | POA: Diagnosis present

## 2018-06-22 DIAGNOSIS — I420 Dilated cardiomyopathy: Secondary | ICD-10-CM | POA: Diagnosis present

## 2018-06-22 DIAGNOSIS — K8000 Calculus of gallbladder with acute cholecystitis without obstruction: Secondary | ICD-10-CM | POA: Diagnosis not present

## 2018-06-22 DIAGNOSIS — E669 Obesity, unspecified: Secondary | ICD-10-CM | POA: Diagnosis present

## 2018-06-22 DIAGNOSIS — K8020 Calculus of gallbladder without cholecystitis without obstruction: Secondary | ICD-10-CM | POA: Diagnosis not present

## 2018-06-22 DIAGNOSIS — E785 Hyperlipidemia, unspecified: Secondary | ICD-10-CM | POA: Diagnosis not present

## 2018-06-22 DIAGNOSIS — M109 Gout, unspecified: Secondary | ICD-10-CM | POA: Diagnosis present

## 2018-06-22 DIAGNOSIS — Z8673 Personal history of transient ischemic attack (TIA), and cerebral infarction without residual deficits: Secondary | ICD-10-CM | POA: Diagnosis not present

## 2018-06-22 DIAGNOSIS — R531 Weakness: Secondary | ICD-10-CM | POA: Diagnosis not present

## 2018-06-22 DIAGNOSIS — J810 Acute pulmonary edema: Secondary | ICD-10-CM | POA: Diagnosis not present

## 2018-06-22 DIAGNOSIS — D638 Anemia in other chronic diseases classified elsewhere: Secondary | ICD-10-CM | POA: Diagnosis present

## 2018-06-22 DIAGNOSIS — I2510 Atherosclerotic heart disease of native coronary artery without angina pectoris: Secondary | ICD-10-CM | POA: Diagnosis not present

## 2018-06-22 DIAGNOSIS — Z7952 Long term (current) use of systemic steroids: Secondary | ICD-10-CM

## 2018-06-22 DIAGNOSIS — I429 Cardiomyopathy, unspecified: Secondary | ICD-10-CM | POA: Diagnosis present

## 2018-06-22 DIAGNOSIS — R29700 NIHSS score 0: Secondary | ICD-10-CM | POA: Diagnosis not present

## 2018-06-22 DIAGNOSIS — I5042 Chronic combined systolic (congestive) and diastolic (congestive) heart failure: Secondary | ICD-10-CM

## 2018-06-22 DIAGNOSIS — K8010 Calculus of gallbladder with chronic cholecystitis without obstruction: Secondary | ICD-10-CM | POA: Diagnosis present

## 2018-06-22 DIAGNOSIS — I6330 Cerebral infarction due to thrombosis of unspecified cerebral artery: Secondary | ICD-10-CM | POA: Diagnosis not present

## 2018-06-22 DIAGNOSIS — R7989 Other specified abnormal findings of blood chemistry: Secondary | ICD-10-CM | POA: Diagnosis present

## 2018-06-22 DIAGNOSIS — Z79899 Other long term (current) drug therapy: Secondary | ICD-10-CM

## 2018-06-22 DIAGNOSIS — Z7984 Long term (current) use of oral hypoglycemic drugs: Secondary | ICD-10-CM | POA: Diagnosis not present

## 2018-06-22 DIAGNOSIS — E1151 Type 2 diabetes mellitus with diabetic peripheral angiopathy without gangrene: Secondary | ICD-10-CM | POA: Diagnosis present

## 2018-06-22 DIAGNOSIS — R1013 Epigastric pain: Secondary | ICD-10-CM | POA: Diagnosis not present

## 2018-06-22 DIAGNOSIS — Z90710 Acquired absence of both cervix and uterus: Secondary | ICD-10-CM

## 2018-06-22 DIAGNOSIS — R109 Unspecified abdominal pain: Secondary | ICD-10-CM | POA: Diagnosis present

## 2018-06-22 DIAGNOSIS — I5041 Acute combined systolic (congestive) and diastolic (congestive) heart failure: Secondary | ICD-10-CM | POA: Diagnosis not present

## 2018-06-22 DIAGNOSIS — Z6830 Body mass index (BMI) 30.0-30.9, adult: Secondary | ICD-10-CM

## 2018-06-22 DIAGNOSIS — E1121 Type 2 diabetes mellitus with diabetic nephropathy: Secondary | ICD-10-CM | POA: Diagnosis not present

## 2018-06-22 DIAGNOSIS — E860 Dehydration: Secondary | ICD-10-CM | POA: Diagnosis present

## 2018-06-22 DIAGNOSIS — I272 Pulmonary hypertension, unspecified: Secondary | ICD-10-CM | POA: Diagnosis present

## 2018-06-22 DIAGNOSIS — M797 Fibromyalgia: Secondary | ICD-10-CM | POA: Diagnosis present

## 2018-06-22 LAB — CBC
HCT: 33.4 % — ABNORMAL LOW (ref 36.0–46.0)
Hemoglobin: 10.5 g/dL — ABNORMAL LOW (ref 12.0–15.0)
MCH: 29.2 pg (ref 26.0–34.0)
MCHC: 31.4 g/dL (ref 30.0–36.0)
MCV: 92.8 fL (ref 78.0–100.0)
Platelets: 299 10*3/uL (ref 150–400)
RBC: 3.6 MIL/uL — ABNORMAL LOW (ref 3.87–5.11)
RDW: 17.1 % — ABNORMAL HIGH (ref 11.5–15.5)
WBC: 7.7 10*3/uL (ref 4.0–10.5)

## 2018-06-22 LAB — COMPREHENSIVE METABOLIC PANEL
ALT: 203 U/L — ABNORMAL HIGH (ref 0–44)
AST: 119 U/L — ABNORMAL HIGH (ref 15–41)
Albumin: 3.3 g/dL — ABNORMAL LOW (ref 3.5–5.0)
Alkaline Phosphatase: 248 U/L — ABNORMAL HIGH (ref 38–126)
Anion gap: 11 (ref 5–15)
BUN: 20 mg/dL (ref 8–23)
CO2: 20 mmol/L — ABNORMAL LOW (ref 22–32)
Calcium: 9 mg/dL (ref 8.9–10.3)
Chloride: 104 mmol/L (ref 98–111)
Creatinine, Ser: 1.16 mg/dL — ABNORMAL HIGH (ref 0.44–1.00)
GFR calc Af Amer: 48 mL/min — ABNORMAL LOW (ref >60)
GFR calc non Af Amer: 41 mL/min — ABNORMAL LOW (ref >60)
Glucose, Bld: 132 mg/dL — ABNORMAL HIGH (ref 70–99)
Potassium: 4.2 mmol/L (ref 3.5–5.1)
Sodium: 135 mmol/L (ref 135–145)
Total Bilirubin: 2 mg/dL — ABNORMAL HIGH (ref 0.3–1.2)
Total Protein: 6.8 g/dL (ref 6.5–8.1)

## 2018-06-22 LAB — LIPASE, BLOOD: Lipase: 150 U/L — ABNORMAL HIGH (ref 11–51)

## 2018-06-22 LAB — TROPONIN I: Troponin I: <0.03 ng/mL (ref <0.03)

## 2018-06-22 MED ORDER — FUROSEMIDE 10 MG/ML IJ SOLN
40.0000 mg | Freq: Once | INTRAMUSCULAR | Status: AC
  Administered 2018-06-22: 40 mg via INTRAVENOUS
  Filled 2018-06-22: qty 4

## 2018-06-22 NOTE — ED Triage Notes (Signed)
The pt is c/o abd pain all day today no n v or diarrhea no dizziness

## 2018-06-22 NOTE — Telephone Encounter (Signed)
Called to give [name] [name] @ [facility]'s recommendation.  [email or web address].

## 2018-06-22 NOTE — Telephone Encounter (Signed)
Follow up     [name] [name] with [facility] at [facility] is returning call in refeence to Dr. Sunday Spillers recommendations

## 2018-06-22 NOTE — Telephone Encounter (Signed)
Spoke with Horticulturist, commercial @ [facility]. Verbal order given with Dr.[place]'s instruction.  Chilton Si, MD        4:59 PM  Note    If her weight is up 3lb then she should take lasix 40mg  instead of 20mg  for the next two days.     Cindy verbalized understanding and will update the patient's family regarding Dr.McDonough's instructions.  sts that a nurse will make a prn f/u to the patient's home tomorrow.  reports the pt normally takes all her medications with breakfast. The nurse visits are done around noon. The pt's BP has been elevated at the last two visits.  Mon 8/12 170/98 Arline Asp made a f/u call later that afternoon and the pt's BP was 139-84  Tues 8/13 the OT reported the pt's BP was 160/80.  Arline Asp that I will f/u the update to Dr.Mingo and we will call back if she has any additional recommendation

## 2018-06-23 ENCOUNTER — Encounter (HOSPITAL_COMMUNITY): Payer: Self-pay | Admitting: Emergency Medicine

## 2018-06-23 ENCOUNTER — Inpatient Hospital Stay (HOSPITAL_COMMUNITY): Payer: Medicare Other

## 2018-06-23 ENCOUNTER — Other Ambulatory Visit: Payer: Self-pay

## 2018-06-23 DIAGNOSIS — R531 Weakness: Secondary | ICD-10-CM | POA: Diagnosis not present

## 2018-06-23 DIAGNOSIS — I160 Hypertensive urgency: Secondary | ICD-10-CM | POA: Diagnosis not present

## 2018-06-23 DIAGNOSIS — N183 Chronic kidney disease, stage 3 (moderate): Secondary | ICD-10-CM | POA: Diagnosis not present

## 2018-06-23 DIAGNOSIS — R7989 Other specified abnormal findings of blood chemistry: Secondary | ICD-10-CM | POA: Diagnosis present

## 2018-06-23 DIAGNOSIS — Z90710 Acquired absence of both cervix and uterus: Secondary | ICD-10-CM | POA: Diagnosis not present

## 2018-06-23 DIAGNOSIS — Z881 Allergy status to other antibiotic agents status: Secondary | ICD-10-CM | POA: Diagnosis not present

## 2018-06-23 DIAGNOSIS — D638 Anemia in other chronic diseases classified elsewhere: Secondary | ICD-10-CM | POA: Diagnosis present

## 2018-06-23 DIAGNOSIS — Z91013 Allergy to seafood: Secondary | ICD-10-CM | POA: Diagnosis not present

## 2018-06-23 DIAGNOSIS — K8020 Calculus of gallbladder without cholecystitis without obstruction: Secondary | ICD-10-CM

## 2018-06-23 DIAGNOSIS — K8000 Calculus of gallbladder with acute cholecystitis without obstruction: Secondary | ICD-10-CM | POA: Diagnosis not present

## 2018-06-23 DIAGNOSIS — E1129 Type 2 diabetes mellitus with other diabetic kidney complication: Secondary | ICD-10-CM | POA: Diagnosis not present

## 2018-06-23 DIAGNOSIS — Z79899 Other long term (current) drug therapy: Secondary | ICD-10-CM | POA: Diagnosis not present

## 2018-06-23 DIAGNOSIS — I340 Nonrheumatic mitral (valve) insufficiency: Secondary | ICD-10-CM | POA: Diagnosis not present

## 2018-06-23 DIAGNOSIS — I639 Cerebral infarction, unspecified: Secondary | ICD-10-CM | POA: Diagnosis not present

## 2018-06-23 DIAGNOSIS — D631 Anemia in chronic kidney disease: Secondary | ICD-10-CM | POA: Diagnosis not present

## 2018-06-23 DIAGNOSIS — R279 Unspecified lack of coordination: Secondary | ICD-10-CM | POA: Diagnosis not present

## 2018-06-23 DIAGNOSIS — D630 Anemia in neoplastic disease: Secondary | ICD-10-CM | POA: Diagnosis not present

## 2018-06-23 DIAGNOSIS — I10 Essential (primary) hypertension: Secondary | ICD-10-CM | POA: Diagnosis not present

## 2018-06-23 DIAGNOSIS — Z7952 Long term (current) use of systemic steroids: Secondary | ICD-10-CM | POA: Diagnosis not present

## 2018-06-23 DIAGNOSIS — I6330 Cerebral infarction due to thrombosis of unspecified cerebral artery: Secondary | ICD-10-CM | POA: Diagnosis not present

## 2018-06-23 DIAGNOSIS — I5041 Acute combined systolic (congestive) and diastolic (congestive) heart failure: Secondary | ICD-10-CM | POA: Diagnosis not present

## 2018-06-23 DIAGNOSIS — I69928 Other speech and language deficits following unspecified cerebrovascular disease: Secondary | ICD-10-CM | POA: Diagnosis not present

## 2018-06-23 DIAGNOSIS — R109 Unspecified abdominal pain: Secondary | ICD-10-CM | POA: Diagnosis present

## 2018-06-23 DIAGNOSIS — Z7982 Long term (current) use of aspirin: Secondary | ICD-10-CM | POA: Diagnosis not present

## 2018-06-23 DIAGNOSIS — R1013 Epigastric pain: Secondary | ICD-10-CM

## 2018-06-23 DIAGNOSIS — I429 Cardiomyopathy, unspecified: Secondary | ICD-10-CM | POA: Diagnosis present

## 2018-06-23 DIAGNOSIS — K851 Biliary acute pancreatitis without necrosis or infection: Secondary | ICD-10-CM | POA: Diagnosis not present

## 2018-06-23 DIAGNOSIS — K8010 Calculus of gallbladder with chronic cholecystitis without obstruction: Secondary | ICD-10-CM | POA: Diagnosis not present

## 2018-06-23 DIAGNOSIS — I129 Hypertensive chronic kidney disease with stage 1 through stage 4 chronic kidney disease, or unspecified chronic kidney disease: Secondary | ICD-10-CM | POA: Diagnosis not present

## 2018-06-23 DIAGNOSIS — I5042 Chronic combined systolic (congestive) and diastolic (congestive) heart failure: Secondary | ICD-10-CM | POA: Diagnosis not present

## 2018-06-23 DIAGNOSIS — M109 Gout, unspecified: Secondary | ICD-10-CM | POA: Diagnosis not present

## 2018-06-23 DIAGNOSIS — R932 Abnormal findings on diagnostic imaging of liver and biliary tract: Secondary | ICD-10-CM | POA: Diagnosis not present

## 2018-06-23 DIAGNOSIS — K819 Cholecystitis, unspecified: Secondary | ICD-10-CM | POA: Diagnosis not present

## 2018-06-23 DIAGNOSIS — Z8673 Personal history of transient ischemic attack (TIA), and cerebral infarction without residual deficits: Secondary | ICD-10-CM | POA: Diagnosis not present

## 2018-06-23 DIAGNOSIS — E1122 Type 2 diabetes mellitus with diabetic chronic kidney disease: Secondary | ICD-10-CM | POA: Diagnosis not present

## 2018-06-23 DIAGNOSIS — M6281 Muscle weakness (generalized): Secondary | ICD-10-CM | POA: Diagnosis not present

## 2018-06-23 DIAGNOSIS — Z96653 Presence of artificial knee joint, bilateral: Secondary | ICD-10-CM | POA: Diagnosis present

## 2018-06-23 DIAGNOSIS — R4781 Slurred speech: Secondary | ICD-10-CM | POA: Diagnosis not present

## 2018-06-23 DIAGNOSIS — Z7984 Long term (current) use of oral hypoglycemic drugs: Secondary | ICD-10-CM | POA: Diagnosis not present

## 2018-06-23 DIAGNOSIS — I2510 Atherosclerotic heart disease of native coronary artery without angina pectoris: Secondary | ICD-10-CM | POA: Diagnosis not present

## 2018-06-23 DIAGNOSIS — J810 Acute pulmonary edema: Secondary | ICD-10-CM

## 2018-06-23 DIAGNOSIS — I420 Dilated cardiomyopathy: Secondary | ICD-10-CM | POA: Diagnosis not present

## 2018-06-23 DIAGNOSIS — I130 Hypertensive heart and chronic kidney disease with heart failure and stage 1 through stage 4 chronic kidney disease, or unspecified chronic kidney disease: Secondary | ICD-10-CM | POA: Diagnosis not present

## 2018-06-23 DIAGNOSIS — K8080 Other cholelithiasis without obstruction: Secondary | ICD-10-CM

## 2018-06-23 DIAGNOSIS — I5043 Acute on chronic combined systolic (congestive) and diastolic (congestive) heart failure: Secondary | ICD-10-CM | POA: Diagnosis not present

## 2018-06-23 DIAGNOSIS — E1121 Type 2 diabetes mellitus with diabetic nephropathy: Secondary | ICD-10-CM

## 2018-06-23 DIAGNOSIS — R262 Difficulty in walking, not elsewhere classified: Secondary | ICD-10-CM | POA: Diagnosis not present

## 2018-06-23 DIAGNOSIS — M069 Rheumatoid arthritis, unspecified: Secondary | ICD-10-CM | POA: Diagnosis not present

## 2018-06-23 DIAGNOSIS — I252 Old myocardial infarction: Secondary | ICD-10-CM | POA: Diagnosis not present

## 2018-06-23 DIAGNOSIS — R945 Abnormal results of liver function studies: Secondary | ICD-10-CM | POA: Diagnosis not present

## 2018-06-23 DIAGNOSIS — K828 Other specified diseases of gallbladder: Secondary | ICD-10-CM | POA: Diagnosis not present

## 2018-06-23 DIAGNOSIS — E785 Hyperlipidemia, unspecified: Secondary | ICD-10-CM | POA: Diagnosis present

## 2018-06-23 DIAGNOSIS — Z885 Allergy status to narcotic agent status: Secondary | ICD-10-CM | POA: Diagnosis not present

## 2018-06-23 DIAGNOSIS — N179 Acute kidney failure, unspecified: Secondary | ICD-10-CM | POA: Diagnosis not present

## 2018-06-23 DIAGNOSIS — Z743 Need for continuous supervision: Secondary | ICD-10-CM | POA: Diagnosis not present

## 2018-06-23 DIAGNOSIS — Z48815 Encounter for surgical aftercare following surgery on the digestive system: Secondary | ICD-10-CM | POA: Diagnosis not present

## 2018-06-23 LAB — BASIC METABOLIC PANEL
Anion gap: 9 (ref 5–15)
BUN: 18 mg/dL (ref 8–23)
CO2: 27 mmol/L (ref 22–32)
Calcium: 9.1 mg/dL (ref 8.9–10.3)
Chloride: 103 mmol/L (ref 98–111)
Creatinine, Ser: 1.18 mg/dL — ABNORMAL HIGH (ref 0.44–1.00)
GFR calc Af Amer: 47 mL/min — ABNORMAL LOW (ref >60)
GFR calc non Af Amer: 41 mL/min — ABNORMAL LOW (ref >60)
Glucose, Bld: 89 mg/dL (ref 70–99)
Potassium: 3.7 mmol/L (ref 3.5–5.1)
Sodium: 139 mmol/L (ref 135–145)

## 2018-06-23 LAB — HEPATIC FUNCTION PANEL
ALT: 204 U/L — ABNORMAL HIGH (ref 0–44)
AST: 120 U/L — ABNORMAL HIGH (ref 15–41)
Albumin: 3.2 g/dL — ABNORMAL LOW (ref 3.5–5.0)
Alkaline Phosphatase: 249 U/L — ABNORMAL HIGH (ref 38–126)
Bilirubin, Direct: 0.5 mg/dL — ABNORMAL HIGH (ref 0.0–0.2)
Indirect Bilirubin: 0.7 mg/dL (ref 0.3–0.9)
Total Bilirubin: 1.2 mg/dL (ref 0.3–1.2)
Total Protein: 6.6 g/dL (ref 6.5–8.1)

## 2018-06-23 LAB — CBC WITH DIFFERENTIAL/PLATELET
Abs Immature Granulocytes: 0 10*3/uL (ref 0.0–0.1)
Basophils Absolute: 0.1 10*3/uL (ref 0.0–0.1)
Basophils Relative: 1 %
Eosinophils Absolute: 0.2 10*3/uL (ref 0.0–0.7)
Eosinophils Relative: 3 %
HCT: 31.8 % — ABNORMAL LOW (ref 36.0–46.0)
Hemoglobin: 10.3 g/dL — ABNORMAL LOW (ref 12.0–15.0)
Immature Granulocytes: 0 %
Lymphocytes Relative: 32 %
Lymphs Abs: 2.7 10*3/uL (ref 0.7–4.0)
MCH: 29.3 pg (ref 26.0–34.0)
MCHC: 32.4 g/dL (ref 30.0–36.0)
MCV: 90.6 fL (ref 78.0–100.0)
Monocytes Absolute: 0.2 10*3/uL (ref 0.1–1.0)
Monocytes Relative: 2 %
Neutro Abs: 5.1 10*3/uL (ref 1.7–7.7)
Neutrophils Relative %: 62 %
Platelets: 306 10*3/uL (ref 150–400)
RBC: 3.51 MIL/uL — ABNORMAL LOW (ref 3.87–5.11)
RDW: 17.2 % — ABNORMAL HIGH (ref 11.5–15.5)
WBC: 8.3 10*3/uL (ref 4.0–10.5)

## 2018-06-23 LAB — URINALYSIS, ROUTINE W REFLEX MICROSCOPIC
Bilirubin Urine: NEGATIVE
Glucose, UA: NEGATIVE mg/dL
Hgb urine dipstick: NEGATIVE
Ketones, ur: NEGATIVE mg/dL
Leukocytes, UA: NEGATIVE
Nitrite: NEGATIVE
Protein, ur: NEGATIVE mg/dL
Specific Gravity, Urine: 1.006 (ref 1.005–1.030)
pH: 6 (ref 5.0–8.0)

## 2018-06-23 LAB — GLUCOSE, CAPILLARY
Glucose-Capillary: 153 mg/dL — ABNORMAL HIGH (ref 70–99)
Glucose-Capillary: 124 mg/dL — ABNORMAL HIGH (ref 70–99)
Glucose-Capillary: 117 mg/dL — ABNORMAL HIGH (ref 70–99)
Glucose-Capillary: 163 mg/dL — ABNORMAL HIGH (ref 70–99)

## 2018-06-23 LAB — TSH: TSH: 1.742 u[IU]/mL (ref 0.350–4.500)

## 2018-06-23 LAB — BRAIN NATRIURETIC PEPTIDE: B Natriuretic Peptide: 1146.7 pg/mL — ABNORMAL HIGH (ref 0.0–100.0)

## 2018-06-23 LAB — TROPONIN I
Troponin I: 0.03 ng/mL (ref <0.03)
Troponin I: 0.03 ng/mL (ref <0.03)
Troponin I: 0.03 ng/mL (ref <0.03)

## 2018-06-23 LAB — LIPASE, BLOOD: Lipase: 77 U/L — ABNORMAL HIGH (ref 11–51)

## 2018-06-23 MED ORDER — HYDRALAZINE HCL 50 MG PO TABS
100.0000 mg | ORAL_TABLET | Freq: Three times a day (TID) | ORAL | Status: DC
  Administered 2018-06-23 – 2018-06-27 (×13): 100 mg via ORAL
  Filled 2018-06-23 (×13): qty 2

## 2018-06-23 MED ORDER — HYDRALAZINE HCL 20 MG/ML IJ SOLN
10.0000 mg | INTRAMUSCULAR | Status: DC | PRN
  Administered 2018-06-24: 10 mg via INTRAVENOUS
  Filled 2018-06-23: qty 1

## 2018-06-23 MED ORDER — PIPERACILLIN-TAZOBACTAM 3.375 G IVPB
3.3750 g | Freq: Three times a day (TID) | INTRAVENOUS | Status: DC
  Administered 2018-06-23 – 2018-06-26 (×9): 3.375 g via INTRAVENOUS
  Filled 2018-06-23 (×13): qty 50

## 2018-06-23 MED ORDER — ONDANSETRON HCL 4 MG PO TABS: 4.0000 mg | ORAL_TABLET | Freq: Four times a day (QID) | ORAL | Status: DC | PRN

## 2018-06-23 MED ORDER — ACETAMINOPHEN 325 MG PO TABS
650.0000 mg | ORAL_TABLET | Freq: Four times a day (QID) | ORAL | Status: DC | PRN
Start: 2018-06-23 — End: 2018-06-27
  Administered 2018-06-26: 650 mg via ORAL
  Filled 2018-06-23: qty 2

## 2018-06-23 MED ORDER — INSULIN ASPART 100 UNIT/ML ~~LOC~~ SOLN
0.0000 [IU] | Freq: Three times a day (TID) | SUBCUTANEOUS | Status: DC
  Administered 2018-06-23: 2 [IU] via SUBCUTANEOUS
  Administered 2018-06-23: 1 [IU] via SUBCUTANEOUS
  Administered 2018-06-25: 2 [IU] via SUBCUTANEOUS
  Administered 2018-06-25: 1 [IU] via SUBCUTANEOUS
  Administered 2018-06-26: 2 [IU] via SUBCUTANEOUS
  Administered 2018-06-26: 1 [IU] via SUBCUTANEOUS

## 2018-06-23 MED ORDER — LORAZEPAM 2 MG/ML IJ SOLN
0.5000 mg | Freq: Once | INTRAMUSCULAR | Status: AC
  Administered 2018-06-23: 0.5 mg via INTRAVENOUS
  Filled 2018-06-23: qty 1

## 2018-06-23 MED ORDER — PIPERACILLIN-TAZOBACTAM 3.375 G IVPB 30 MIN
3.3750 g | Freq: Once | INTRAVENOUS | Status: AC
  Administered 2018-06-23: 3.375 g via INTRAVENOUS
  Filled 2018-06-23: qty 50

## 2018-06-23 MED ORDER — ALBUTEROL SULFATE (2.5 MG/3ML) 0.083% IN NEBU
3.0000 mL | INHALATION_SOLUTION | Freq: Four times a day (QID) | RESPIRATORY_TRACT | Status: DC | PRN
Start: 2018-06-23 — End: 2018-06-27

## 2018-06-23 MED ORDER — ALLOPURINOL 100 MG PO TABS
100.0000 mg | ORAL_TABLET | Freq: Every day | ORAL | Status: DC
  Administered 2018-06-23 – 2018-06-27 (×4): 100 mg via ORAL
  Filled 2018-06-23 (×4): qty 1

## 2018-06-23 MED ORDER — ISOSORBIDE MONONITRATE ER 30 MG PO TB24
30.0000 mg | ORAL_TABLET | Freq: Every day | ORAL | Status: DC
  Administered 2018-06-23 – 2018-06-27 (×4): 30 mg via ORAL
  Filled 2018-06-23 (×4): qty 1

## 2018-06-23 MED ORDER — ACETAMINOPHEN 650 MG RE SUPP: 650.0000 mg | Freq: Four times a day (QID) | RECTAL | Status: DC | PRN

## 2018-06-23 MED ORDER — ONDANSETRON HCL 4 MG/2ML IJ SOLN: 4.0000 mg | Freq: Four times a day (QID) | INTRAMUSCULAR | Status: DC | PRN

## 2018-06-23 MED ORDER — CARVEDILOL 25 MG PO TABS
25.0000 mg | ORAL_TABLET | Freq: Two times a day (BID) | ORAL | Status: DC
  Administered 2018-06-23 – 2018-06-27 (×9): 25 mg via ORAL
  Filled 2018-06-23 (×10): qty 1

## 2018-06-23 MED ORDER — FUROSEMIDE 10 MG/ML IJ SOLN
40.0000 mg | Freq: Once | INTRAMUSCULAR | Status: AC
  Administered 2018-06-23: 40 mg via INTRAVENOUS
  Filled 2018-06-23: qty 4

## 2018-06-23 MED ORDER — HYDROCORTISONE NA SUCCINATE PF 100 MG IJ SOLR
50.0000 mg | Freq: Three times a day (TID) | INTRAMUSCULAR | Status: DC
  Administered 2018-06-23 – 2018-06-26 (×10): 50 mg via INTRAVENOUS
  Filled 2018-06-23 (×11): qty 2

## 2018-06-23 MED ORDER — GABAPENTIN 100 MG PO CAPS
100.0000 mg | ORAL_CAPSULE | Freq: Every day | ORAL | Status: DC
  Administered 2018-06-23 – 2018-06-26 (×4): 100 mg via ORAL
  Filled 2018-06-23 (×4): qty 1

## 2018-06-23 NOTE — Progress Notes (Signed)
Patient has several healed wounds on her lower extremities. There are also several nonblanchable dark spots on bilateral lower extremities and buttocks.

## 2018-06-23 NOTE — ED Provider Notes (Signed)
[facility] EMERGENCY DEPARTMENT Provider Note   CSN: [id number] Arrival date & time: 06/22/18  2143     History   Chief Complaint Chief Complaint  Patient presents with  . Abdominal Pain    HPI [name] [name] is a 84 y.o. female.  The history is provided by the patient. No language interpreter was used.  Weakness  Primary symptoms include no focal weakness, no loss of sensation, no loss of balance, no speech change, no memory loss, no movement disorder, no visual change, no auditory change, and no dizziness. This is a recurrent problem. The current episode started more than 2 days ago. The problem has not changed since onset.There was no focality noted. There has been no fever. Associated symptoms include shortness of breath. Pertinent negatives include no vomiting, no altered mental status, no confusion and no headaches. Associated symptoms comments: LuQ and r flank pain . There were no medications administered prior to arrival. Associated medical issues do not include trauma or seizures.    Past Medical History:  Diagnosis Date  . Allergic rhinitis due to pollen 04/27/2007  . Arthritis    "in q joint" (08/07/2013)  . CHF (congestive heart failure) (HCC)   . Coronary atherosclerosis of native coronary artery   . Cough   . Disorder of bone and cartilage, unspecified   . Edema 05/03/2013  . Exertional shortness of breath    "sometimes" (08/07/2013)  . First degree atrioventricular block   . Gout, unspecified 04/19/2013  . Hyperlipidemia 04/27/2007  . Hypertension   . Muscle spasm   . Muscle weakness (generalized)   . Myocardial infarction (HCC) ~ 1970  . Onychia and paronychia of toe   . Osteoarthrosis, unspecified whether generalized or localized, unspecified site 04/27/2007  . Other fall   . Other malaise and fatigue   . Pneumonia 05/2018  . Prepatellar bursitis   . Seizures (HCC)   . Stroke (HCC) 01/17/2014  . TIA (transient ischemic attack)    "a few one summer" (08/07/2013)  . Type II diabetes mellitus (HCC)    "fasting 90-110s" (08/07/2013)  . Unspecified essential hypertension 08/08/2013  . Unspecified vitamin D deficiency     Patient Active Problem List   Diagnosis Date Noted  . Uncontrolled hypertension 06/18/2018  . Acute metabolic encephalopathy 06/18/2018  . Acute kidney injury superimposed on chronic kidney disease (HCC) 06/18/2018  . Pneumonia of left upper lobe due to infectious organism (HCC)   . Elevated troponin   . DCM (dilated cardiomyopathy) (HCC)   . Benign hypertensive heart and kidney disease with CHF and stage 3 chronic kidney disease (HCC) 05/28/2018  . Degenerative joint disease involving multiple joints 05/06/2018  . Adhesive capsulitis of right shoulder 10/12/2017  . Idiopathic chronic gout of multiple sites with tophus 08/25/2017  . Rheumatic nodule 06/28/2017  . History of total knee arthroplasty, bilateral 02/18/2017  . History of rotator cuff surgery 02/18/2017  . Obesity (BMI 30.0-34.9) 12/08/2016  . High risk medications (not anticoagulants) long-term use 09/21/2016  . Hammer toe 10/31/2015  . Acute on chronic combined systolic and diastolic congestive heart failure (HCC) 09/12/2015  . Midline low back pain without sciatica 06/27/2014  . Bilateral edema of lower extremity 06/05/2014  . CKD stage 3 due to type 2 diabetes mellitus (HCC) 06/05/2014  . DM (diabetes mellitus), type 2 with renal complications (HCC) 06/05/2014    Class: Chronic  . Anemia of chronic disease 05/01/2014  . Fluctuating blood pressure 02/06/2014  . Stroke (  HCC) 01/17/2014  . Rheumatoid arthritis involving multiple sites (HCC) 12/19/2013    Class: Chronic  . CAD (coronary artery disease) 10/18/2013  . Fibromyalgia 05/10/2013  . Reactive airway disease 04/19/2013  . Hyperlipidemia 04/27/2007  . Allergic rhinitis 04/27/2007  . Diverticulosis 04/27/2007    Past Surgical History:  Procedure Laterality Date  .  ABDOMINAL HYSTERECTOMY  04/1980  . APPENDECTOMY  1960  . CARDIAC CATHETERIZATION  2003  . CATARACT EXTRACTION W/ INTRAOCULAR LENS  IMPLANT, BILATERAL Bilateral ~ 2012  . JOINT REPLACEMENT    . REPLACEMENT TOTAL KNEE Right 09/2005  . RIGHT/LEFT HEART CATH AND CORONARY ANGIOGRAPHY N/A 06/01/2018   Procedure: RIGHT/LEFT HEART CATH AND CORONARY ANGIOGRAPHY;  Surgeon: Corky Crafts, MD;  Location: [facility] INVASIVE CV LAB;  Service: Cardiovascular;  Laterality: N/A;  . SHOULDER OPEN ROTATOR CUFF REPAIR Right 07/1999  . TONSILLECTOMY  08/1974  . TOTAL KNEE ARTHROPLASTY Left 08/07/2013   Procedure: TOTAL KNEE ARTHROPLASTY- LEFT;  Surgeon: Dannielle Huh, MD;  Location: [facility] OR;  Service: Orthopedics;  Laterality: Left;  . TRANSTHORACIC ECHOCARDIOGRAM  2003   EF 55-65%; mild concentric LVH     OB History   None      Home Medications    Prior to Admission medications   Medication Sig Start Date End Date Taking? Authorizing Provider  albuterol (PROVENTIL HFA;VENTOLIN HFA) 108 (90 Base) MCG/ACT inhaler Inhale 1 puff into the lungs every 6 (six) hours as needed for wheezing or shortness of breath.   Yes [provider]  allopurinol (ZYLOPRIM) 100 MG tablet Take 100 mg by mouth daily.    Yes [provider]  aspirin EC 81 MG tablet Take 81 mg by mouth daily.   Yes [provider]  calcium-vitamin D (OSCAL WITH D) 500-200 MG-UNIT per tablet Take 1 tablet by mouth daily.    Yes [provider]  carvedilol (COREG) 25 MG tablet Take 1 tablet (25 mg total) by mouth 2 (two) times daily with a meal. 06/04/18  Yes Berton Mount I, MD  D3-1000 1000 units capsule Take 1,000 Units by mouth daily. 06/07/18  Yes [provider]  diclofenac sodium (VOLTAREN) 1 % GEL Apply 1 application topically daily. 02/22/18  Yes Helane Rima, DO  ferrous sulfate 325 (65 FE) MG tablet Take 1 tablet (325 mg total) by mouth 2 (two) times daily with a meal. 06/04/18  Yes Barnetta Chapel, MD  furosemide (LASIX) 20 MG tablet Take 1 tablet (20 mg total) by mouth daily. 06/05/18  Yes Berton Mount I, MD  gabapentin (NEURONTIN) 100 MG capsule Take 1 capsule (100 mg total) by mouth at bedtime. 05/19/18  Yes Helane Rima, DO  hydrALAZINE (APRESOLINE) 100 MG tablet TAKE 1 TABLET BY MOUTH THREE TIMES DAILY 04/05/18  Yes Helane Rima, DO  hydroxychloroquine (PLAQUENIL) 200 MG tablet Take 200 mg by mouth 2 (two) times daily.   Yes [provider]  hydroxypropyl methylcellulose / hypromellose (ISOPTO TEARS / GONIOVISC) 2.5 % ophthalmic solution Place 1 drop into both eyes 4 (four) times daily as needed for dry eyes. 07/07/17  Yes Helane Rima, DO  isosorbide mononitrate (IMDUR) 30 MG 24 hr tablet Take 1 tablet (30 mg total) by mouth daily. 06/05/18  Yes Barnetta Chapel, MD  leflunomide (ARAVA) 20 MG tablet Take 20 mg by mouth daily.  05/20/18  Yes [provider]  metFORMIN (GLUCOPHAGE) 500 MG tablet Take 1 tablet (500 mg total) by mouth daily with breakfast. 01/28/18  Yes Helane Rima,  DO  methotrexate (RHEUMATREX) 2.5 MG tablet Take 10 mg by mouth every 7 (seven) days. THURSDAYS 06/10/18  Yes [provider]  Olopatadine HCl 0.2 % SOLN Apply 1 drop to eye daily. 02/22/18  Yes Helane Rima, DO  potassium chloride SA (K-DUR,KLOR-CON) 20 MEQ tablet Take 1 tablet (20 mEq total) by mouth daily for 7 days. 06/05/18 06/22/18 Yes Barnetta Chapel, MD  predniSONE (DELTASONE) 10 MG tablet Take 1 tablet (10 mg total) by mouth daily with breakfast. 03/25/18  Yes Helane Rima, DO  traMADol (ULTRAM) 50 MG tablet Take 50-100 mg by mouth every 6 (six) hours as needed for pain. 06/16/18  Yes [provider]  triamcinolone cream (KENALOG) 0.1 % Apply 1 application topically 2 (two) times daily.   Yes [provider]  losartan (COZAAR) 100 MG tablet Take 100 mg by mouth daily. 06/17/18   [provider]  spironolactone (ALDACTONE) 25 MG tablet Take  0.5 tablets (12.5 mg total) by mouth daily. Patient not taking: Reported on 06/22/2018 06/05/18   Barnetta Chapel, MD    Family History Family History  Problem Relation Age of Onset  . Heart attack Father     Social History Social History   Tobacco Use  . Smoking status: Never Smoker  . Smokeless tobacco: Never Used  Substance Use Topics  . Alcohol use: No    Alcohol/week: 0.0 standard drinks  . Drug use: No     Allergies   Clonidine derivatives; Fish allergy; Shellfish allergy; Doxycycline; Indomethacin; Lyrica [pregabalin]; Methyldopa; Orange fruit [citrus]; Cetirizine hcl; Codeine; Levaquin [levofloxacin in d5w]; and Tomato   Review of Systems Review of Systems  Constitutional: Positive for appetite change and fatigue. Negative for diaphoresis.  Respiratory: Positive for shortness of breath. Negative for choking.   Cardiovascular: Negative for palpitations and leg swelling.  Gastrointestinal: Positive for abdominal pain. Negative for vomiting.  Neurological: Positive for weakness. Negative for dizziness, speech change, focal weakness, headaches and loss of balance.  Psychiatric/Behavioral: Negative for confusion and memory loss.  All other systems reviewed and are negative.    Physical Exam Updated Vital Signs BP (!) 171/98   Pulse 95   Temp 98.1 F (36.7 C) (Oral)   Resp 19   Ht 5\' 4"  (1.626 m)   Wt 78.9 kg   SpO2 98%   BMI 29.87 kg/m   Physical Exam  Constitutional: She is oriented to person, place, and time. She appears well-developed and well-nourished.  HENT:  Head: Normocephalic and atraumatic.  Mouth/Throat: No oropharyngeal exudate.  Eyes: Pupils are equal, round, and reactive to light. Conjunctivae are normal.  Neck: Normal range of motion.  Cardiovascular: Normal rate, regular rhythm, normal heart sounds and intact distal pulses.  Pulmonary/Chest: No respiratory distress. She has rales. She exhibits no tenderness.  Abdominal: Soft. Bowel  sounds are normal. She exhibits no distension and no mass. There is no tenderness. There is no rebound and no guarding. No hernia.  Musculoskeletal: Normal range of motion. She exhibits edema. She exhibits no tenderness or deformity.  Lymphadenopathy:    She has no cervical adenopathy.  Neurological: She is alert and oriented to person, place, and time. She displays normal reflexes.  Skin: Skin is warm and dry. Capillary refill takes less than 2 seconds.  Psychiatric: She has a normal mood and affect.     ED Treatments / Results  Labs (all labs ordered are listed, but only abnormal results are displayed) Results for orders placed or performed during the  hospital encounter of 06/22/18  Lipase, blood  Result Value Ref Range   Lipase 150 (H) 11 - 51 U/L  Comprehensive metabolic panel  Result Value Ref Range   Sodium 135 135 - 145 mmol/L   Potassium 4.2 3.5 - 5.1 mmol/L   Chloride 104 98 - 111 mmol/L   CO2 20 (L) 22 - 32 mmol/L   Glucose, Bld 132 (H) 70 - 99 mg/dL   BUN 20 8 - 23 mg/dL   Creatinine, Ser 1.96 (H) 0.44 - 1.00 mg/dL   Calcium 9.0 8.9 - 22.2 mg/dL   Total Protein 6.8 6.5 - 8.1 g/dL   Albumin 3.3 (L) 3.5 - 5.0 g/dL   AST 979 (H) 15 - 41 U/L   ALT 203 (H) 0 - 44 U/L   Alkaline Phosphatase 248 (H) 38 - 126 U/L   Total Bilirubin 2.0 (H) 0.3 - 1.2 mg/dL   GFR calc non Af Amer 41 (L) >60 mL/min   GFR calc Af Amer 48 (L) >60 mL/min   Anion gap 11 5 - 15  CBC  Result Value Ref Range   WBC 7.7 4.0 - 10.5 K/uL   RBC 3.60 (L) 3.87 - 5.11 MIL/uL   Hemoglobin 10.5 (L) 12.0 - 15.0 g/dL   HCT 89.2 (L) 11.9 - 41.7 %   MCV 92.8 78.0 - 100.0 fL   MCH 29.2 26.0 - 34.0 pg   MCHC 31.4 30.0 - 36.0 g/dL   RDW 40.8 (H) 14.4 - 81.8 %   Platelets 299 150 - 400 K/uL  Urinalysis, Routine w reflex microscopic  Result Value Ref Range   Color, Urine YELLOW YELLOW   APPearance CLEAR CLEAR   Specific Gravity, Urine 1.006 1.005 - 1.030   pH 6.0 5.0 - 8.0   Glucose, UA NEGATIVE NEGATIVE  mg/dL   Hgb urine dipstick NEGATIVE NEGATIVE   Bilirubin Urine NEGATIVE NEGATIVE   Ketones, ur NEGATIVE NEGATIVE mg/dL   Protein, ur NEGATIVE NEGATIVE mg/dL   Nitrite NEGATIVE NEGATIVE   Leukocytes, UA NEGATIVE NEGATIVE  Troponin I  Result Value Ref Range   Troponin I <0.03 <0.03 ng/mL  Brain natriuretic peptide  Result Value Ref Range   B Natriuretic Peptide 1,146.7 (H) 0.0 - 100.0 pg/mL  Hepatic function panel  Result Value Ref Range   Total Protein 6.6 6.5 - 8.1 g/dL   Albumin 3.2 (L) 3.5 - 5.0 g/dL   AST 563 (H) 15 - 41 U/L   ALT 204 (H) 0 - 44 U/L   Alkaline Phosphatase 249 (H) 38 - 126 U/L   Total Bilirubin 1.2 0.3 - 1.2 mg/dL   Bilirubin, Direct 0.5 (H) 0.0 - 0.2 mg/dL   Indirect Bilirubin 0.7 0.3 - 0.9 mg/dL  Basic metabolic panel  Result Value Ref Range   Sodium 139 135 - 145 mmol/L   Potassium 3.7 3.5 - 5.1 mmol/L   Chloride 103 98 - 111 mmol/L   CO2 27 22 - 32 mmol/L   Glucose, Bld 89 70 - 99 mg/dL   BUN 18 8 - 23 mg/dL   Creatinine, Ser 1.49 (H) 0.44 - 1.00 mg/dL   Calcium 9.1 8.9 - 70.2 mg/dL   GFR calc non Af Amer 41 (L) >60 mL/min   GFR calc Af Amer 47 (L) >60 mL/min   Anion gap 9 5 - 15  CBC WITH DIFFERENTIAL  Result Value Ref Range   WBC 8.3 4.0 - 10.5 K/uL   RBC 3.51 (L) 3.87 - 5.11 MIL/uL  Hemoglobin 10.3 (L) 12.0 - 15.0 g/dL   HCT 50.3 (L) 88.8 - 28.0 %   MCV 90.6 78.0 - 100.0 fL   MCH 29.3 26.0 - 34.0 pg   MCHC 32.4 30.0 - 36.0 g/dL   RDW 03.4 (H) 91.7 - 91.5 %   Platelets 306 150 - 400 K/uL   Neutrophils Relative % 62 %   Neutro Abs 5.1 1.7 - 7.7 K/uL   Lymphocytes Relative 32 %   Lymphs Abs 2.7 0.7 - 4.0 K/uL   Monocytes Relative 2 %   Monocytes Absolute 0.2 0.1 - 1.0 K/uL   Eosinophils Relative 3 %   Eosinophils Absolute 0.2 0.0 - 0.7 K/uL   Basophils Relative 1 %   Basophils Absolute 0.1 0.0 - 0.1 K/uL   Immature Granulocytes 0 %   Abs Immature Granulocytes 0.0 0.0 - 0.1 K/uL  TSH  Result Value Ref Range   TSH 1.742 0.350 - 4.500  uIU/mL  Troponin I  Result Value Ref Range   Troponin I 0.03 (HH) <0.03 ng/mL  Lipase, blood  Result Value Ref Range   Lipase 77 (H) 11 - 51 U/L   Dg Chest 2 View  Result Date: 06/22/2018 CLINICAL DATA:  Chest pain and abdominal pain EXAM: CHEST - 2 VIEW COMPARISON:  06/01/2018 FINDINGS: Cardiac shadow is again enlarged but stable. Mild vascular congestion is noted without significant edema. No focal infiltrate is seen. No acute bony abnormality is noted. Chronic changes about both shoulder joints are seen. IMPRESSION: Cardiomegaly and mild congestive changes. Electronically Signed   By: Alcide Clever [name]   On: 06/22/2018 22:26   Dg Chest 2 View  Result Date: 05/28/2018 CLINICAL DATA:  Weakness 6 days with intermittent altered mental status. EXAM: CHEST - 2 VIEW COMPARISON:  11/16/2017 FINDINGS: Lungs are adequately inflated demonstrate airspace consolidation over the left midlung likely left upper lobe pneumonia. No evidence of effusion. Slight worsening moderate cardiomegaly. Remainder of the exam is unchanged IMPRESSION: Mild airspace opacification over the left upper lobe likely pneumonia. Slight worsening moderate cardiomegaly. Electronically Signed   By: Elberta Fortis [name]   On: 05/28/2018 16:10   Ct Head Wo Contrast  Result Date: 05/29/2018 CLINICAL DATA:  Drowsiness with RIGHT facial droop. EXAM: CT HEAD WITHOUT CONTRAST TECHNIQUE: Contiguous axial images were obtained from the base of the skull through the vertex without intravenous contrast. COMPARISON:  05/28/2018. FINDINGS: Brain: No evidence for acute infarction, hemorrhage, mass lesion, hydrocephalus, or extra-axial fluid. Generalized atrophy. Moderate small vessel disease. Vascular: Calcification of the cavernous internal carotid arteries consistent with cerebrovascular atherosclerotic disease. No signs of intracranial large vessel occlusion. Skull: Calvarium intact. Sinuses/Orbits: Clear sinuses.  No orbital findings. Other: No  mastoid fluid. IMPRESSION: Atrophy and white matter disease, stable in appearance from 05/28/2018. No acute intracranial findings. Electronically Signed   By: Elsie Stain [name]   On: 05/29/2018 21:44   Ct Head Wo Contrast  Result Date: 05/28/2018 CLINICAL DATA:  Generalized weakness since 05/22/2018. Intermittent altered mental status. EXAM: CT HEAD WITHOUT CONTRAST TECHNIQUE: Contiguous axial images were obtained from the base of the skull through the vertex without intravenous contrast. COMPARISON:  11/29/2016. FINDINGS: Brain: Diffusely enlarged ventricles and subarachnoid spaces. Patchy white matter low density in both cerebral hemispheres. No intracranial hemorrhage, mass lesion or CT evidence of acute infarction. Vascular: No hyperdense vessel or unexpected calcification. Skull: Bilateral hyperostosis frontalis. Sinuses/Orbits: Status post bilateral cataract extraction. Single residual opacified left ethmoid air cell. Resolved right mastoid air cell  opacification. Other: None. IMPRESSION: 1. No acute abnormality. 2. Stable atrophy. 3. Progressive chronic small vessel white matter ischemic changes in both cerebral hemispheres. 4. Improved left ethmoid sinusitis. 5. Resolved right mastoid mucosal thickening and fluid. Electronically Signed   By: Beckie Salts [name]   On: 05/28/2018 15:56   Ct Angio Chest Pe W And/or Wo Contrast  Result Date: 05/28/2018 CLINICAL DATA:  Shortness of breath. EXAM: CT ANGIOGRAPHY CHEST WITH CONTRAST TECHNIQUE: Multidetector CT imaging of the chest was performed using the standard protocol during bolus administration of intravenous contrast. Multiplanar CT image reconstructions and MIPs were obtained to evaluate the vascular anatomy. CONTRAST:  100 mL ISOVUE-370 IOPAMIDOL (ISOVUE-370) INJECTION 76% COMPARISON:  Chest CT angiogram September 11, 2015; chest radiograph May 28, 2018 FINDINGS: Cardiovascular: There is no demonstrable pulmonary embolus. There is no thoracic aortic  aneurysm. No dissection is seen in the thoracic aorta. Note that the contrast bolus in the aorta is less than optimal for assessment for potential dissection. Visualized great vessels appear unremarkable except for slight calcification in the left common carotid artery. Note that the left common carotid and right innominate arteries arise as a common trunk, an anatomic variant. There is no appreciable pericardial effusion or pericardial thickening. There is aortic atherosclerosis. There are foci of coronary artery calcification. Mediastinum/Nodes: There is a subcentimeter nodular opacity in the left lobe of the thyroid. No dominant thyroid mass evident. There is no evident thoracic adenopathy. No esophageal lesions are appreciable. Lungs/Pleura: There is airspace consolidation in the anterior segment left upper lobe consistent with pneumonia. There is atelectatic change in each lower lobe with a small right pleural effusion evident. On axial slice 69 series 11, there is a 5 mm nodular opacity in the lateral segment of the right middle lobe. There is a nodular opacity in the anterior segment of the right upper lobe measuring 1.5 x 1.1 cm. This nodular opacity is best seen on coronal slice 88 series 7 but is also appreciable on sagittal slice 61 series 8 and axial slice 23 series 11. Upper Abdomen: In the visualized upper abdominal region, there is mild reflux into the inferior vena cava and hepatic veins suggesting increase in right heart pressure. There is atherosclerotic calcification in the aorta and proximal major mesenteric arterial vessels. There is a cyst arising from the lateral upper pole right kidney measuring 5.2 x 5.1 cm. Musculoskeletal: There is degenerative change in the thoracic spine. There are no blastic or lytic bone lesions. There is extensive arthropathy in each shoulder. No evident chest wall lesions. Review of the MIP images confirms the above findings. IMPRESSION: 1. There is a 1.5 x 1.1 cm  nodular opacity in the anterior segment of the right upper lobe. Consider one of the following in 3 months for both low-risk and high-risk individuals: (a) repeat chest CT, (b) follow-up PET-CT, or (c) tissue sampling. This recommendation follows the consensus statement: Guidelines for Management of Incidental Pulmonary Nodules Detected on CT Images: From the Fleischner Society 2017; Radiology 2017; 284:228-243. 2. Anterior segment left upper lobe airspace consolidation consistent with pneumonia. 3. No demonstrable pulmonary embolus. No thoracic aortic aneurysm. No dissection seen. It should be noted that the contrast bolus in the aorta is insufficient to exclude dissection confidently as a differential consideration by radiography. 4. There is aortic atherosclerosis. There are foci of coronary artery calcification. There are foci of great vessel and mesenteric arterial vessel calcification as well. 5.  Small right pleural effusion with bibasilar atelectasis. 6. Subcentimeter  left thyroid nodule. Per consensus guidelines, this lesion does not warrant additional imaging surveillance. 7.  No demonstrable thoracic adenopathy. 8. Reflux into the inferior vena cava and hepatic veins may be indicative of a degree of increase in right heart pressure. Aortic Atherosclerosis (ICD10-I70.0). Electronically Signed   By: Bretta Bang III [name]   On: 05/28/2018 19:08   US Abdomen Complete  Result Date: 06/23/2018 CLINICAL DATA:  Abdominal pain for 3 days. History of appendectomy, hysterectomy. EXAM: ABDOMEN ULTRASOUND COMPLETE COMPARISON:  Abdominal ultrasound November 30, 2016 and CT abdomen and pelvis September 11, 2015. FINDINGS: Gallbladder: Mild distended gallbladder with punctate echogenic layering gallbladder sludge and cholelithiasis measuring to 4 mm. No gallbladder wall thickening or pericholecystic fluid. No sonographic Murphy sign elicited. Common bile duct: Diameter: Dilated common bile duct measuring to 15 mm  with suspected sludge. Liver: No focal lesion identified. Mild intrahepatic biliary dilatation. Within normal limits in parenchymal echogenicity. Portal vein is patent on color Doppler imaging with normal direction of blood flow towards the liver. IVC: No abnormality visualized. Pancreas: Visualized portion unremarkable. Spleen: Size and appearance within normal limits. Right Kidney: Length: 10.5 cm. Mildly increased cortical echogenicity. No solid mass or hydronephrosis visualized. 5.2 cm homogeneously anechoic cyst upper pole RIGHT kidney. Left Kidney: Length: 11.4 cm. Mildly increased cortical echogenicity. No solid mass or hydronephrosis visualized. Abdominal aorta: No aneurysm visualized. Echogenic calcific atherosclerosis. Other findings: None. IMPRESSION: 1. Cholelithiasis and sludge without sonographic findings of acute cholecystitis. 2. Biliary dilatation with potential choledocholithiasis/sludge. MRCP may be more definitive. 3. Mildly echogenic kidneys seen with medical renal disease. Aortic Atherosclerosis (ICD10-I70.0). Electronically Signed   By: Awilda Metro [name]   On: 06/23/2018 00:37   Dg Chest Port 1 View  Result Date: 06/01/2018 CLINICAL DATA:  Recent pneumonia EXAM: PORTABLE CHEST 1 VIEW COMPARISON:  Chest radiograph and chest CT May 28, 2018 FINDINGS: There has been interval clearing of infiltrate from the left upper lobe. Mild atelectatic change remains in this area. There is no new opacity elsewhere. There is cardiomegaly with mild pulmonary venous hypertension. No adenopathy. There is advanced arthropathy in both shoulders. There is postoperative change in the right shoulder region. IMPRESSION: Interval clearing of infiltrate from left upper lobe. Mild atelectasis remains in this area. No new opacity. There is a degree of underlying pulmonary vascular congestion. There is advanced arthropathy in both shoulders with evidence suggesting avascular necrosis in each humeral head. Note  that the nodular opacity in the right upper lobe seen on recent chest CT is not appreciable by radiography. Please see recommendations with respect to recent chest CT report. Electronically Signed   By: Bretta Bang III [name]   On: 06/01/2018 07:09    EKG EKG Interpretation  Date/Time:  Wednesday June 22 2018 21:52:10 EDT Ventricular Rate:  94 PR Interval:  172 QRS Duration: 94 QT Interval:  382 QTC Calculation: 477 R Axis:   -26 Text Interpretation:  Normal sinus rhythm Possible Left atrial enlargement Confirmed by Nicanor Alcon, Daelon Dunivan ([id number]) on 06/22/2018 11:03:59 PM   Radiology Dg Chest 2 View  Result Date: 06/22/2018 CLINICAL DATA:  Chest pain and abdominal pain EXAM: CHEST - 2 VIEW COMPARISON:  06/01/2018 FINDINGS: Cardiac shadow is again enlarged but stable. Mild vascular congestion is noted without significant edema. No focal infiltrate is seen. No acute bony abnormality is noted. Chronic changes about both shoulder joints are seen. IMPRESSION: Cardiomegaly and mild congestive changes. Electronically Signed   By: Alcide Clever [name]   On: 06/22/2018  22:26   US Abdomen Complete  Result Date: 06/23/2018 CLINICAL DATA:  Abdominal pain for 3 days. History of appendectomy, hysterectomy. EXAM: ABDOMEN ULTRASOUND COMPLETE COMPARISON:  Abdominal ultrasound November 30, 2016 and CT abdomen and pelvis September 11, 2015. FINDINGS: Gallbladder: Mild distended gallbladder with punctate echogenic layering gallbladder sludge and cholelithiasis measuring to 4 mm. No gallbladder wall thickening or pericholecystic fluid. No sonographic Murphy sign elicited. Common bile duct: Diameter: Dilated common bile duct measuring to 15 mm with suspected sludge. Liver: No focal lesion identified. Mild intrahepatic biliary dilatation. Within normal limits in parenchymal echogenicity. Portal vein is patent on color Doppler imaging with normal direction of blood flow towards the liver. IVC: No abnormality visualized.  Pancreas: Visualized portion unremarkable. Spleen: Size and appearance within normal limits. Right Kidney: Length: 10.5 cm. Mildly increased cortical echogenicity. No solid mass or hydronephrosis visualized. 5.2 cm homogeneously anechoic cyst upper pole RIGHT kidney. Left Kidney: Length: 11.4 cm. Mildly increased cortical echogenicity. No solid mass or hydronephrosis visualized. Abdominal aorta: No aneurysm visualized. Echogenic calcific atherosclerosis. Other findings: None. IMPRESSION: 1. Cholelithiasis and sludge without sonographic findings of acute cholecystitis. 2. Biliary dilatation with potential choledocholithiasis/sludge. MRCP may be more definitive. 3. Mildly echogenic kidneys seen with medical renal disease. Aortic Atherosclerosis (ICD10-I70.0). Electronically Signed   By: Awilda Metro [name]   On: 06/23/2018 00:37    Procedures Procedures (including critical care time)  Medications Ordered in ED Medications  furosemide (LASIX) injection 40 mg (40 mg Intravenous Given 06/22/18 2327)     Final Clinical Impressions(s) / ED Diagnoses   Final diagnoses:  Weakness  Acute pulmonary edema (HCC)  Biliary calculus of other site without obstruction   Admit for acute pulmonary edema    Kimya Mccahill, MD 06/23/18 7903

## 2018-06-23 NOTE — Progress Notes (Signed)
PT Cancellation Note  Patient Details Name: MORGHAN KESTER MRN: [id number] DOB: 1930/07/15   Cancelled Treatment:    Reason Eval/Treat Not Completed: Fatigue/lethargy limiting ability to participate(pt just returned from MRI. She stated she's too fatigued and hungry to do any PT at present. Will follow. )   Tamala Ser 06/23/2018, 1:07 PM [phone]

## 2018-06-23 NOTE — H&P (Signed)
History and Physical    [name] [name] DOB: [birth year] DOA: 06/22/2018  PCP: Helane Rima, DO  Patient coming from: Home.  Chief Complaint: Abdominal pain.  HPI: [name] [name] is a 84 y.o. female with history of chronic systolic heart failure last EF measured last month was 20 to 25%, hypertension, diabetes mellitus, rheumatoid arthritis who was recently admitted for acute CHF and discharged to rehab and had gone back to home last week started experiencing nausea with abdominal pain last 3 days.  Unable to eat usual food because of the nausea and epigastric pain.  Epigastric pain radiates to the back and has been episodic and at times constant but denies any chest pain or shortness of breath.  Denies any diarrhea.  ED Course: In the ER labs reviewed elevated lipase with elevated AST and ALT and total bilirubin.  Sonogram of the abdomen done shows gallstones with possible CBD dilation concerning for possible choledocholithiasis.  Since chest x-ray also showed congestion ER patient had given 1 dose of IV Lasix.  Patient admitted for further management of epigastric pain with elevated LFTs concerning for CBD stones.  Review of Systems: As per HPI, rest all negative.   Past Medical History:  Diagnosis Date  . Allergic rhinitis due to pollen 04/27/2007  . Arthritis    "in q joint" (08/07/2013)  . CHF (congestive heart failure) (HCC)   . Coronary atherosclerosis of native coronary artery   . Cough   . Disorder of bone and cartilage, unspecified   . Edema 05/03/2013  . Exertional shortness of breath    "sometimes" (08/07/2013)  . First degree atrioventricular block   . Gout, unspecified 04/19/2013  . Hyperlipidemia 04/27/2007  . Hypertension   . Muscle spasm   . Muscle weakness (generalized)   . Myocardial infarction (HCC) ~ 1970  . Onychia and paronychia of toe   . Osteoarthrosis, unspecified whether generalized or localized, unspecified site 04/27/2007  . Other  fall   . Other malaise and fatigue   . Pneumonia 05/2018  . Prepatellar bursitis   . Seizures (HCC)   . Stroke (HCC) 01/17/2014  . TIA (transient ischemic attack)    "a few one summer" (08/07/2013)  . Type II diabetes mellitus (HCC)    "fasting 90-110s" (08/07/2013)  . Unspecified essential hypertension 08/08/2013  . Unspecified vitamin D deficiency     Past Surgical History:  Procedure Laterality Date  . ABDOMINAL HYSTERECTOMY  04/1980  . APPENDECTOMY  1960  . CARDIAC CATHETERIZATION  2003  . CATARACT EXTRACTION W/ INTRAOCULAR LENS  IMPLANT, BILATERAL Bilateral ~ 2012  . JOINT REPLACEMENT    . REPLACEMENT TOTAL KNEE Right 09/2005  . RIGHT/LEFT HEART CATH AND CORONARY ANGIOGRAPHY N/A 06/01/2018   Procedure: RIGHT/LEFT HEART CATH AND CORONARY ANGIOGRAPHY;  Surgeon: Corky Crafts, MD;  Location: [facility] INVASIVE CV LAB;  Service: Cardiovascular;  Laterality: N/A;  . SHOULDER OPEN ROTATOR CUFF REPAIR Right 07/1999  . TONSILLECTOMY  08/1974  . TOTAL KNEE ARTHROPLASTY Left 08/07/2013   Procedure: TOTAL KNEE ARTHROPLASTY- LEFT;  Surgeon: Dannielle Huh, MD;  Location: [facility] OR;  Service: Orthopedics;  Laterality: Left;  . TRANSTHORACIC ECHOCARDIOGRAM  2003   EF 55-65%; mild concentric LVH     reports that she has never smoked. She has never used smokeless tobacco. She reports that she does not drink alcohol or use drugs.  Allergies  Allergen Reactions  . Clonidine Derivatives Swelling    [name] reports patient's tongue was swollen and  patient hallucinated  . Fish Allergy Diarrhea, Swelling and Other (See Comments)    Turns skin "black," but can tolerate white fish Salmon- Diarrhea  . Shellfish Allergy Hives  . Doxycycline Rash  . Indomethacin Other (See Comments)    Reaction not recalled by the patient  . Lyrica [Pregabalin] Other (See Comments)    Hallucinations   . Methyldopa Other (See Comments)    Aldomet (for hypertension): Reaction not recalled by the patient  .  Orange Fruit [Citrus] Other (See Comments)    Indigestion/heartburn  . Cetirizine Hcl Itching and Rash  . Codeine Itching  . Levaquin [Levofloxacin In D5w] Rash  . Tomato Rash    Family History  Problem Relation Age of Onset  . Heart attack Father     Prior to Admission medications   Medication Sig Start Date End Date Taking? Authorizing Provider  albuterol (PROVENTIL HFA;VENTOLIN HFA) 108 (90 Base) MCG/ACT inhaler Inhale 1 puff into the lungs every 6 (six) hours as needed for wheezing or shortness of breath.   Yes [provider]  allopurinol (ZYLOPRIM) 100 MG tablet Take 100 mg by mouth daily.    Yes [provider]  aspirin EC 81 MG tablet Take 81 mg by mouth daily.   Yes [provider]  calcium-vitamin D (OSCAL WITH D) 500-200 MG-UNIT per tablet Take 1 tablet by mouth daily.    Yes [provider]  carvedilol (COREG) 25 MG tablet Take 1 tablet (25 mg total) by mouth 2 (two) times daily with a meal. 06/04/18  Yes Berton Mount I, MD  D3-1000 1000 units capsule Take 1,000 Units by mouth daily. 06/07/18  Yes [provider]  diclofenac sodium (VOLTAREN) 1 % GEL Apply 1 application topically daily. 02/22/18  Yes Helane Rima, DO  ferrous sulfate 325 (65 FE) MG tablet Take 1 tablet (325 mg total) by mouth 2 (two) times daily with a meal. 06/04/18  Yes Barnetta Chapel, MD  furosemide (LASIX) 20 MG tablet Take 1 tablet (20 mg total) by mouth daily. 06/05/18  Yes Berton Mount I, MD  gabapentin (NEURONTIN) 100 MG capsule Take 1 capsule (100 mg total) by mouth at bedtime. 05/19/18  Yes Helane Rima, DO  hydrALAZINE (APRESOLINE) 100 MG tablet TAKE 1 TABLET BY MOUTH THREE TIMES DAILY 04/05/18  Yes Helane Rima, DO  hydroxychloroquine (PLAQUENIL) 200 MG tablet Take 200 mg by mouth 2 (two) times daily.   Yes [provider]  hydroxypropyl methylcellulose / hypromellose (ISOPTO TEARS / GONIOVISC) 2.5 % ophthalmic solution Place 1 drop  into both eyes 4 (four) times daily as needed for dry eyes. 07/07/17  Yes Helane Rima, DO  isosorbide mononitrate (IMDUR) 30 MG 24 hr tablet Take 1 tablet (30 mg total) by mouth daily. 06/05/18  Yes Barnetta Chapel, MD  leflunomide (ARAVA) 20 MG tablet Take 20 mg by mouth daily.  05/20/18  Yes [provider]  metFORMIN (GLUCOPHAGE) 500 MG tablet Take 1 tablet (500 mg total) by mouth daily with breakfast. 01/28/18  Yes Helane Rima, DO  methotrexate (RHEUMATREX) 2.5 MG tablet Take 10 mg by mouth every 7 (seven) days. THURSDAYS 06/10/18  Yes [provider]  Olopatadine HCl 0.2 % SOLN Apply 1 drop to eye daily. 02/22/18  Yes Helane Rima, DO  potassium chloride SA (K-DUR,KLOR-CON) 20 MEQ tablet Take 1 tablet (20 mEq total) by mouth daily for 7 days. 06/05/18 06/22/18 Yes Berton Mount I, MD  predniSONE (DELTASONE) 10 MG tablet Take 1 tablet (10  mg total) by mouth daily with breakfast. 03/25/18  Yes Helane Rima, DO  traMADol (ULTRAM) 50 MG tablet Take 50-100 mg by mouth every 6 (six) hours as needed for pain. 06/16/18  Yes [provider]  triamcinolone cream (KENALOG) 0.1 % Apply 1 application topically 2 (two) times daily.   Yes [provider]  losartan (COZAAR) 100 MG tablet Take 100 mg by mouth daily. 06/17/18   [provider]  spironolactone (ALDACTONE) 25 MG tablet Take 0.5 tablets (12.5 mg total) by mouth daily. Patient not taking: Reported on 06/22/2018 06/05/18   Barnetta Chapel, MD    Physical Exam: Vitals:   06/23/18 0115 06/23/18 0200 06/23/18 0245 06/23/18 0335  BP: (!) 172/95 (!) 169/90 (!) 162/87 (!) 152/80  Pulse: 89 91 86 88  Resp: 13 17 17 18   Temp:    97.8 F (36.6 C)  TempSrc:    Oral  SpO2: 96% 97% 93% 99%  Weight:    78.9 kg  Height:    5\' 4"  (1.626 m)      Constitutional: Moderately built and nourished. Vitals:   06/23/18 0115 06/23/18 0200 06/23/18 0245 06/23/18 0335  BP: (!) 172/95 (!) 169/90 (!) 162/87 (!)  152/80  Pulse: 89 91 86 88  Resp: 13 17 17 18   Temp:    97.8 F (36.6 C)  TempSrc:    Oral  SpO2: 96% 97% 93% 99%  Weight:    78.9 kg  Height:    5\' 4"  (1.626 m)   Eyes: Anicteric no pallor. ENMT: No discharge from the ears eyes nose or mouth. Neck: No mass felt.  No neck rigidity.  No JVD appreciated. Respiratory: No rhonchi or crepitations. Cardiovascular: S1-S2 heard no murmurs appreciated. Abdomen: Soft nontender bowel sounds present.  No guarding no rigidity no rebound tenderness. Musculoskeletal: No edema. Skin: No rash. Neurologic: Alert awake oriented to time place and person.  Moves all extremities. Psychiatric: Appears normal per normal affect.   Labs on Admission: I have personally reviewed following labs and imaging studies  CBC: Recent Labs  Lab 06/22/18 2153  WBC 7.7  HGB 10.5*  HCT 33.4*  MCV 92.8  PLT 299   Basic Metabolic Panel: Recent Labs  Lab 06/22/18 2153  NA 135  K 4.2  CL 104  CO2 20*  GLUCOSE 132*  BUN 20  CREATININE 1.16*  CALCIUM 9.0   GFR: Estimated Creatinine Clearance: 35.4 mL/min (A) (by C-G formula based on SCr of 1.16 mg/dL (H)). Liver Function Tests: Recent Labs  Lab 06/22/18 2153  AST 119*  ALT 203*  ALKPHOS 248*  BILITOT 2.0*  PROT 6.8  ALBUMIN 3.3*   Recent Labs  Lab 06/22/18 2153  LIPASE 150*   No results for input(s): AMMONIA in the last 168 hours. Coagulation Profile: No results for input(s): INR, PROTIME in the last 168 hours. Cardiac Enzymes: Recent Labs  Lab 06/22/18 2153  TROPONINI <0.03   BNP (last 3 results) No results for input(s): PROBNP in the last 8760 hours. HbA1C: No results for input(s): HGBA1C in the last 72 hours. CBG: No results for input(s): GLUCAP in the last 168 hours. Lipid Profile: No results for input(s): CHOL, HDL, LDLCALC, TRIG, CHOLHDL, LDLDIRECT in the last 72 hours. Thyroid Function Tests: No results for input(s): TSH, T4TOTAL, FREET4, T3FREE, THYROIDAB in the last 72  hours. Anemia Panel: No results for input(s): VITAMINB12, FOLATE, FERRITIN, TIBC, IRON, RETICCTPCT in the last 72 hours. Urine analysis:    Component Value  Date/Time   COLORURINE YELLOW 06/22/2018 2156   APPEARANCEUR CLEAR 06/22/2018 2156   LABSPEC 1.006 06/22/2018 2156   PHURINE 6.0 06/22/2018 2156   GLUCOSEU NEGATIVE 06/22/2018 2156   HGBUR NEGATIVE 06/22/2018 2156   BILIRUBINUR NEGATIVE 06/22/2018 2156   BILIRUBINUR Negative 01/13/2017 1234   KETONESUR NEGATIVE 06/22/2018 2156   PROTEINUR NEGATIVE 06/22/2018 2156   UROBILINOGEN 0.2 01/13/2017 1234   UROBILINOGEN 1.0 09/11/2015 0135   NITRITE NEGATIVE 06/22/2018 2156   LEUKOCYTESUR NEGATIVE 06/22/2018 2156   Sepsis Labs: @LABRCNTIP (procalcitonin:4,lacticidven:4) )No results found for this or any previous visit (from the past 240 hour(s)).   Radiological Exams on Admission: Dg Chest 2 View  Result Date: 06/22/2018 CLINICAL DATA:  Chest pain and abdominal pain EXAM: CHEST - 2 VIEW COMPARISON:  06/01/2018 FINDINGS: Cardiac shadow is again enlarged but stable. Mild vascular congestion is noted without significant edema. No focal infiltrate is seen. No acute bony abnormality is noted. Chronic changes about both shoulder joints are seen. IMPRESSION: Cardiomegaly and mild congestive changes. Electronically Signed   By: Alcide Clever [name]   On: 06/22/2018 22:26   US Abdomen Complete  Result Date: 06/23/2018 CLINICAL DATA:  Abdominal pain for 3 days. History of appendectomy, hysterectomy. EXAM: ABDOMEN ULTRASOUND COMPLETE COMPARISON:  Abdominal ultrasound November 30, 2016 and CT abdomen and pelvis September 11, 2015. FINDINGS: Gallbladder: Mild distended gallbladder with punctate echogenic layering gallbladder sludge and cholelithiasis measuring to 4 mm. No gallbladder wall thickening or pericholecystic fluid. No sonographic Murphy sign elicited. Common bile duct: Diameter: Dilated common bile duct measuring to 15 mm with suspected sludge.  Liver: No focal lesion identified. Mild intrahepatic biliary dilatation. Within normal limits in parenchymal echogenicity. Portal vein is patent on color Doppler imaging with normal direction of blood flow towards the liver. IVC: No abnormality visualized. Pancreas: Visualized portion unremarkable. Spleen: Size and appearance within normal limits. Right Kidney: Length: 10.5 cm. Mildly increased cortical echogenicity. No solid mass or hydronephrosis visualized. 5.2 cm homogeneously anechoic cyst upper pole RIGHT kidney. Left Kidney: Length: 11.4 cm. Mildly increased cortical echogenicity. No solid mass or hydronephrosis visualized. Abdominal aorta: No aneurysm visualized. Echogenic calcific atherosclerosis. Other findings: None. IMPRESSION: 1. Cholelithiasis and sludge without sonographic findings of acute cholecystitis. 2. Biliary dilatation with potential choledocholithiasis/sludge. MRCP may be more definitive. 3. Mildly echogenic kidneys seen with medical renal disease. Aortic Atherosclerosis (ICD10-I70.0). Electronically Signed   By: Awilda Metro [name]   On: 06/23/2018 00:37    EKG: Independently reviewed.  Normal sinus rhythm.  Assessment/Plan Active Problems:   Rheumatoid arthritis involving multiple sites (HCC)   Anemia of chronic disease   CKD stage 3 due to type 2 diabetes mellitus (HCC)   DM (diabetes mellitus), type 2 with renal complications (HCC)   DCM (dilated cardiomyopathy) (HCC)   Uncontrolled hypertension   Abdominal pain   Cholelithiasis   Elevated LFTs    1. Acute abdominal pain mostly in the epigastric area with elevated LFTs with gallstones concerning for CBD stones -I have placed patient on clear liquid diet empiric antibiotics repeat LFTs lipase and I have ordered MRCP.  Based on MRCP will have further plans.  Given that patient has persistent nausea and abdominal pain on eating likely will need to be closely followed for worsening symptoms.  May need ERCP if patient's  MRCP does show stones. 2. Chronic systolic heart failure last EF measured last month was 20 to 35% was given 1 dose of Lasix in the ER.  At this time will hold  off further dose of Lasix as patient still has nausea and clinically appears dehydrated. 3. Rheumatoid arthritis on prednisone.  Will keep patient on stress dose of steroids for now. 4. Diabetes mellitus type 2 we will keep patient on sliding scale coverage. 5. Hypertension on Coreg Imdur and hydralazine. 6. Chronic kidney disease stage III creatinine appears to be at baseline. 7. Anemia likely from renal disease follow CBC. 8. History of gout on allopurinol.   DVT prophylaxis: SCDs. Code Status: Full code. Family Communication: [name]. Disposition Plan: Home. Consults called: None. Admission status: Inpatient.   Eduard Clos MD [facility] Pager [phone].  If 7PM-7AM, please contact night-coverage [email or web address] Password [facility]  06/23/2018, 4:52 AM

## 2018-06-23 NOTE — Progress Notes (Signed)
  Transitions of Care Polypharmacy Medication Deprescribing Review  [name] [name] is an 84 y.o. female admitted with intra-abdominal infection on 06/22/2018.   The patient's PTA medications and comorbidities were reviewed to identify opportunities for medication optimization through deprescribing.  Short-Term Recommendations: . Confirm all medications and dosages at home with the patient, as she appears confused with medication changes and might be taking leftover medications that were meant to be discontinued.   Long-Term Recommendations: . Consider if all antihypertensive medications are necessary (BP has fluctuated 110-160/70-100). Could decrease losartan or hydralazine doses if hypotension a concern. . Consider checking vitamin D level to assure therapeutic response. If increased vitamin D supplementation desired, consider switching to a calcium-vitamin D supplement with increased vitamin D content instead of taking two separate supplements.   Patient Assessment:  . Extent of Polypharmacy: 24 PTA medications, hyperpolypharmacy (10+ meds) o Associated risk: moderate-severe o Number of home Rx meds: 19 o Number of home OTC meds: 5 . Comorbidity Polypharmacy Score: 39 o Associated risk: morbid . Charlson Comorbidity Index: 8 o Associated risk: morbid (0% estimated 10-year survival) . GerontoNet Adverse Drug Event Risk Score: 8 o Associated risk: severe (~28% risk ADE)  Overall risk: high; extreme hyperpolypharmacy, high risk scores from age and comorbidities    Medication Assessment:  . Potentially Inappropriate Medications (PIMs)  o High risk medications: 0 o Moderate risk medications: 2 - Furosemide, tramadol o Low risk medications: 1 - Prednisone  . Medications Associated with Geriatric Syndromes (MAGS): 9 o Carvedilol, furosemide, hydralazine, isosorbide mononitrate, metformin, prednisone, spironolactone, losartan, tramadol  Overall risk: moderate; no high risk meds,  most of the medications necessary for comorbidities (CHF and RA)   The patient was interviewed to determine her interest in making medication changes. Shared decision-making was used to determine the optimal medications to deprescribe.   Patient Deprescribing Readiness: . Patient Opinions About Deprescribing survey score: 14 o Score of 4-8: negative patient opinion of deprescribing; deprescribing likely to be unsuccessful o Score of 9-11: intermediate patient opinion of deprescribing; deprescribing may be successful o Score of 12-16: positive patient opinion of deprescribing; deprescribing likely to be successful   Shared Decision-Making Discussion:  . Patient expressed interest in deprescribing, noting that she has gotten confused about all of the medicines she is supposed to be taking after having been in and out of the hospital. She was knowledgeable about her medication schedule and described taking pills four times a day, but was unable to recall the names of all the medicines she was taking at each time. . Patient expressed concerns about her RA medications, stating that she doesn't think she's taking leflunomide or hydroxychloroquine anymore but that she thinks methotrexate has also been causing her harm. She did report taking tramadol 100 mg QAM and QPM which relieves her joint pain, and she was advised of the potential risks with this medication especially in combination with gabapentin. She was unable to confirm other medications but did express disliking the number of pills she was taking.  . Patient reported looking forward to simplifying her medication regimen with her doctor and expressed willingness to make any changes deemed appropriate.   Please see above for final recommendations.  Greer Pickerel, Student-PharmD 06/23/2018 12:25 PM

## 2018-06-23 NOTE — Consult Note (Signed)
[facility] Surgery Consult/Admission Note  [name] [name] 1930-04-29  [id number].    Requesting MD: Dr. Verlon Au Chief Complaint/Reason for Consult: gallstone pancreatitis  HPI:   Pt is a 84 yo female with a hx of CHF, HTN, DM, rheumatoid arthritis on prednisone, with a recent admission (05/2018) for PNA and CHF, who went to rehab and returned home on 08/09. According to the daughter pt was doing well but 2 days after being home the pt began experiencing fatigue and mild abdominal pain. Pt states pain started in the RUQ, was intermittent, mild and moved into her lower abdomen. Associated anorexia and fatigue. No other associated symptoms. Daughter states pt was not complaining of CP, SOB, fever, or chills. Pt was very sleepy at the time of my visit. Daughter states pt is active at baseline. Currently pt has no complaints. Hx of abdominal hysterectomy. No anticoagulation.   Current labs: Alk phos 249, AST 120, ALT 204, Tbili 1.2, BNP 1146 MRCP showed mild dilatation of common hepatic duct and CBD, no obstructing lesions, small amt of sludge layers the CBD. No gallstones. No pancreatic inflammation. US showed Cholelithiasis and sludge without sonographic findings of acute cholecystitis. Biliary dilatation with potential choledocholithiasis/sludge.  ROS:  Review of Systems  Constitutional: Positive for malaise/fatigue. Negative for chills, diaphoresis and fever.  HENT: Negative for sore throat.   Respiratory: Negative for cough and shortness of breath.   Cardiovascular: Negative for chest pain.  Gastrointestinal: Positive for abdominal pain. Negative for blood in stool, constipation, diarrhea, nausea and vomiting.  Genitourinary: Negative for dysuria.  Skin: Negative for rash.  Neurological: Negative for dizziness and loss of consciousness.  All other systems reviewed and are negative.    Family History  Problem Relation Age of Onset  . Heart attack Father     Past Medical  History:  Diagnosis Date  . Allergic rhinitis due to pollen 04/27/2007  . Arthritis    "in q joint" (08/07/2013)  . CHF (congestive heart failure) ([place])   . Coronary atherosclerosis of native coronary artery   . Cough   . Disorder of bone and cartilage, unspecified   . Edema 05/03/2013  . Exertional shortness of breath    "sometimes" (08/07/2013)  . First degree atrioventricular block   . Gout, unspecified 04/19/2013  . Hyperlipidemia 04/27/2007  . Hypertension   . Muscle spasm   . Muscle weakness (generalized)   . Myocardial infarction ([place]) ~ 1970  . Onychia and paronychia of toe   . Osteoarthrosis, unspecified whether generalized or localized, unspecified site 04/27/2007  . Other fall   . Other malaise and fatigue   . Pneumonia 05/2018  . Prepatellar bursitis   . Seizures ([place])   . Stroke ([place]) 01/17/2014  . TIA (transient ischemic attack)    "a few one summer" (08/07/2013)  . Type II diabetes mellitus ([place])    "fasting 90-110s" (08/07/2013)  . Unspecified essential hypertension 08/08/2013  . Unspecified vitamin D deficiency     Past Surgical History:  Procedure Laterality Date  . ABDOMINAL HYSTERECTOMY  04/1980  . APPENDECTOMY  1960  . CARDIAC CATHETERIZATION  2003  . CATARACT EXTRACTION W/ INTRAOCULAR LENS  IMPLANT, BILATERAL Bilateral ~ 2012  . JOINT REPLACEMENT    . REPLACEMENT TOTAL KNEE Right 09/2005  . RIGHT/LEFT HEART CATH AND CORONARY ANGIOGRAPHY N/A 06/01/2018   Procedure: RIGHT/LEFT HEART CATH AND CORONARY ANGIOGRAPHY;  Surgeon: Jettie Booze, MD;  Location: [facility];  Service: Cardiovascular;  Laterality: N/A;  .  SHOULDER OPEN ROTATOR CUFF REPAIR Right 07/1999  . TONSILLECTOMY  08/1974  . TOTAL KNEE ARTHROPLASTY Left 08/07/2013   Procedure: TOTAL KNEE ARTHROPLASTY- LEFT;  Surgeon: Vickey Huger, MD;  Location: [facility];  Service: Orthopedics;  Laterality: Left;  . TRANSTHORACIC ECHOCARDIOGRAM  2003   EF 55-65%; mild concentric LVH    Social  History:  reports that she has never smoked. She has never used smokeless tobacco. She reports that she does not drink alcohol or use drugs.  Allergies:  Allergies  Allergen Reactions  . Clonidine Derivatives Swelling    [name]  . Fish Allergy Diarrhea, Swelling and Other (See Comments)    Turns skin "black," but can tolerate white fish Salmon- Diarrhea  . Shellfish Allergy Hives  . Doxycycline Rash  . Indomethacin Other (See Comments)    Reaction not recalled by the patient  . Lyrica [Pregabalin] Other (See Comments)    Hallucinations   . Methyldopa Other (See Comments)    Aldomet (for hypertension): Reaction not recalled by the patient  . Orange Fruit [Citrus] Other (See Comments)    Indigestion/heartburn  . Cetirizine Hcl Itching and Rash  . Codeine Itching  . Levaquin [Levofloxacin In D5w] Rash  . Tomato Rash    Medications Prior to Admission  Medication Sig Dispense Refill  . albuterol (PROVENTIL HFA;VENTOLIN HFA) 108 (90 Base) MCG/ACT inhaler Inhale 1 puff into the lungs every 6 (six) hours as needed for wheezing or shortness of breath.    . allopurinol (ZYLOPRIM) 100 MG tablet Take 100 mg by mouth daily.     Marland Kitchen aspirin EC 81 MG tablet Take 81 mg by mouth daily.    . calcium-vitamin D (OSCAL WITH D) 500-200 MG-UNIT per tablet Take 1 tablet by mouth daily.     . carvedilol (COREG) 25 MG tablet Take 1 tablet (25 mg total) by mouth 2 (two) times daily with a meal. 60 tablet 0  . D3-1000 1000 units capsule Take 1,000 Units by mouth daily.  0  . diclofenac sodium (VOLTAREN) 1 % GEL Apply 1 application topically daily. 100 g 4  . ferrous sulfate 325 (65 FE) MG tablet Take 1 tablet (325 mg total) by mouth 2 (two) times daily with a meal. 60 tablet 0  . furosemide (LASIX) 20 MG tablet Take 1 tablet (20 mg total) by mouth daily. 30 tablet 0  . gabapentin (NEURONTIN) 100 MG capsule Take 1 capsule (100 mg total)  by mouth at bedtime. 30 capsule 3  . hydrALAZINE (APRESOLINE) 100 MG tablet TAKE 1 TABLET BY MOUTH THREE TIMES DAILY 90 tablet 2  . hydroxychloroquine (PLAQUENIL) 200 MG tablet Take 200 mg by mouth 2 (two) times daily.    . hydroxypropyl methylcellulose / hypromellose (ISOPTO TEARS / GONIOVISC) 2.5 % ophthalmic solution Place 1 drop into both eyes 4 (four) times daily as needed for dry eyes. 15 mL 2  . isosorbide mononitrate (IMDUR) 30 MG 24 hr tablet Take 1 tablet (30 mg total) by mouth daily. 30 tablet 0  . leflunomide (ARAVA) 20 MG tablet Take 20 mg by mouth daily.     . metFORMIN (GLUCOPHAGE) 500 MG tablet Take 1 tablet (500 mg total) by mouth daily with breakfast. 90 tablet 2  . methotrexate (RHEUMATREX) 2.5 MG tablet Take 10 mg by mouth every 7 (seven) days. THURSDAYS  2  . Olopatadine HCl 0.2 % SOLN Apply 1 drop to eye daily. 2.5 mL 3  .  potassium chloride SA (K-DUR,KLOR-CON) 20 MEQ tablet Take 1 tablet (20 mEq total) by mouth daily for 7 days. 7 tablet 0  . predniSONE (DELTASONE) 10 MG tablet Take 1 tablet (10 mg total) by mouth daily with breakfast. 5 tablet 0  . traMADol (ULTRAM) 50 MG tablet Take 50-100 mg by mouth every 6 (six) hours as needed for pain.  0  . triamcinolone cream (KENALOG) 0.1 % Apply 1 application topically 2 (two) times daily.    Marland Kitchen losartan (COZAAR) 100 MG tablet Take 100 mg by mouth daily.    Marland Kitchen spironolactone (ALDACTONE) 25 MG tablet Take 0.5 tablets (12.5 mg total) by mouth daily. (Patient not taking: Reported on 06/22/2018) 30 tablet 0    Blood pressure (!) 174/104, pulse 84, temperature (!) 97.4 F (36.3 C), temperature source Oral, resp. rate 16, height _0  (1.626 m), weight 78.9 kg, SpO2 98 %.  Physical Exam  Constitutional: She is oriented to person, place, and time. She appears well-developed and well-nourished. She is sleeping.  Non-toxic appearance. She does not appear ill. No distress.  HENT:  Head: Normocephalic and atraumatic.  Nose: Nose normal.   Mouth/Throat: Oropharynx is clear and moist and mucous membranes are normal. No oropharyngeal exudate.  Eyes: Pupils are equal, round, and reactive to light. Conjunctivae are normal. Right eye exhibits no discharge. Left eye exhibits no discharge. No scleral icterus.  Neck: Normal range of motion. Neck supple. No thyromegaly present.  Cardiovascular: Normal rate, regular rhythm, normal heart sounds and intact distal pulses.  No murmur heard. Pulses:      Radial pulses are 2+ on the right side, and 2+ on the left side.  Pulmonary/Chest: Effort normal. No respiratory distress. She has decreased breath sounds (b/l bases). She has no wheezes. She has no rhonchi. She has no rales.  Abdominal: Soft. Normal appearance and bowel sounds are normal. She exhibits no distension. There is no hepatosplenomegaly. There is no tenderness. There is no rigidity and no guarding.  Musculoskeletal: Normal range of motion. She exhibits edema (mild b/l edema). She exhibits no tenderness or deformity.  Lymphadenopathy:    She has no cervical adenopathy.  Neurological: She is alert and oriented to person, place, and time.  Skin: Skin is warm and dry. No rash noted. She is not diaphoretic.  Psychiatric: She has a normal mood and affect.  Nursing note and vitals reviewed.   Results for orders placed or performed during the hospital encounter of 06/22/18 (from the past 48 hour(s))  Lipase, blood     Status: Abnormal   Collection Time: 06/22/18  9:53 PM  Result Value Ref Range   Lipase 150 (H) 11 - 51 U/L    Comment: Performed at [facility], [address], [place], [place] [address]  Comprehensive metabolic panel     Status: Abnormal   Collection Time: 06/22/18  9:53 PM  Result Value Ref Range   Sodium 135 135 - 145 mmol/L   Potassium 4.2 3.5 - 5.1 mmol/L   Chloride 104 98 - 111 mmol/L   CO2 20 (L) 22 - 32 mmol/L   Glucose, Bld 132 (H) 70 - 99 mg/dL   BUN 20 8 - 23 mg/dL   Creatinine, Ser 1.16 (H) 0.44  - 1.00 mg/dL   Calcium 9.0 8.9 - 10.3 mg/dL   Total Protein 6.8 6.5 - 8.1 g/dL   Albumin 3.3 (L) 3.5 - 5.0 g/dL   AST 119 (H) 15 - 41 U/L   ALT  203 (H) 0 - 44 U/L   Alkaline Phosphatase 248 (H) 38 - 126 U/L   Total Bilirubin 2.0 (H) 0.3 - 1.2 mg/dL   GFR calc non Af Amer 41 (L) >60 mL/min   GFR calc Af Amer 48 (L) >60 mL/min    Comment: (NOTE) The eGFR has been calculated using the CKD EPI equation. This calculation has not been validated in all clinical situations. eGFR's persistently <60 mL/min signify possible Chronic Kidney Disease.    Anion gap 11 5 - 15    Comment: Performed at [facility] [address] [place], [place] [address]  CBC     Status: Abnormal   Collection Time: 06/22/18  9:53 PM  Result Value Ref Range   WBC 7.7 4.0 - 10.5 K/uL   RBC 3.60 (L) 3.87 - 5.11 MIL/uL   Hemoglobin 10.5 (L) 12.0 - 15.0 g/dL   HCT 33.4 (L) 36.0 - 46.0 %   MCV 92.8 78.0 - 100.0 fL   MCH 29.2 26.0 - 34.0 pg   MCHC 31.4 30.0 - 36.0 g/dL   RDW 17.1 (H) 11.5 - 15.5 %   Platelets 299 150 - 400 K/uL    Comment: Performed at [facility] [address] [place], [place] [address]  Troponin I     Status: None   Collection Time: 06/22/18  9:53 PM  Result Value Ref Range   Troponin I <0.03 <0.03 ng/mL    Comment: Performed at [facility] [address] [place], [place] [address]  Brain natriuretic peptide     Status: Abnormal   Collection Time: 06/22/18  9:53 PM  Result Value Ref Range   B Natriuretic Peptide 1,146.7 (H) 0.0 - 100.0 pg/mL    Comment: Performed at [facility] [address], [place], [place] [address]  Urinalysis, Routine w reflex microscopic     Status: None   Collection Time: 06/22/18  9:56 PM  Result Value Ref Range   Color, Urine YELLOW YELLOW   APPearance CLEAR CLEAR   Specific Gravity, Urine 1.006 1.005 - 1.030   pH 6.0 5.0 - 8.0   Glucose, UA NEGATIVE NEGATIVE mg/dL   Hgb urine dipstick NEGATIVE NEGATIVE   Bilirubin Urine  NEGATIVE NEGATIVE   Ketones, ur NEGATIVE NEGATIVE mg/dL   Protein, ur NEGATIVE NEGATIVE mg/dL   Nitrite NEGATIVE NEGATIVE   Leukocytes, UA NEGATIVE NEGATIVE    Comment: Performed at [facility] [address], [place], [place] [address]  Hepatic function panel     Status: Abnormal   Collection Time: 06/23/18  5:00 AM  Result Value Ref Range   Total Protein 6.6 6.5 - 8.1 g/dL   Albumin 3.2 (L) 3.5 - 5.0 g/dL   AST 120 (H) 15 - 41 U/L   ALT 204 (H) 0 - 44 U/L   Alkaline Phosphatase 249 (H) 38 - 126 U/L   Total Bilirubin 1.2 0.3 - 1.2 mg/dL   Bilirubin, Direct 0.5 (H) 0.0 - 0.2 mg/dL   Indirect Bilirubin 0.7 0.3 - 0.9 mg/dL    Comment: Performed at [facility] [address] [place], [place] [address]  Basic metabolic panel     Status: Abnormal   Collection Time: 06/23/18  5:00 AM  Result Value Ref Range   Sodium 139 135 - 145 mmol/L   Potassium 3.7 3.5 - 5.1 mmol/L   Chloride 103 98 - 111 mmol/L   CO2 27 22 -  32 mmol/L   Glucose, Bld 89 70 - 99 mg/dL   BUN 18 8 - 23 mg/dL   Creatinine, Ser 1.18 (H) 0.44 - 1.00 mg/dL   Calcium 9.1 8.9 - 10.3 mg/dL   GFR calc non Af Amer 41 (L) >60 mL/min   GFR calc Af Amer 47 (L) >60 mL/min    Comment: (NOTE) The eGFR has been calculated using the CKD EPI equation. This calculation has not been validated in all clinical situations. eGFR's persistently <60 mL/min signify possible Chronic Kidney Disease.    Anion gap 9 5 - 15    Comment: Performed at [facility] [address], [place], [place] [address]  CBC WITH DIFFERENTIAL     Status: Abnormal   Collection Time: 06/23/18  5:00 AM  Result Value Ref Range   WBC 8.3 4.0 - 10.5 K/uL   RBC 3.51 (L) 3.87 - 5.11 MIL/uL   Hemoglobin 10.3 (L) 12.0 - 15.0 g/dL   HCT 31.8 (L) 36.0 - 46.0 %   MCV 90.6 78.0 - 100.0 fL   MCH 29.3 26.0 - 34.0 pg   MCHC 32.4 30.0 - 36.0 g/dL   RDW 17.2 (H) 11.5 - 15.5 %   Platelets 306 150 - 400 K/uL   Neutrophils Relative % 62 %   Neutro  Abs 5.1 1.7 - 7.7 K/uL   Lymphocytes Relative 32 %   Lymphs Abs 2.7 0.7 - 4.0 K/uL   Monocytes Relative 2 %   Monocytes Absolute 0.2 0.1 - 1.0 K/uL   Eosinophils Relative 3 %   Eosinophils Absolute 0.2 0.0 - 0.7 K/uL   Basophils Relative 1 %   Basophils Absolute 0.1 0.0 - 0.1 K/uL   Immature Granulocytes 0 %   Abs Immature Granulocytes 0.0 0.0 - 0.1 K/uL    Comment: Performed at [facility], [address] [place], [place] [address]  TSH     Status: None   Collection Time: 06/23/18  5:00 AM  Result Value Ref Range   TSH 1.742 0.350 - 4.500 uIU/mL    Comment: Performed by a 3rd Generation assay with a functional sensitivity of <=0.01 uIU/mL. Performed at [facility], [place] [address] [place], [place] [address]   Troponin I     Status: Abnormal   Collection Time: 06/23/18  5:00 AM  Result Value Ref Range   Troponin I 0.03 (HH) <0.03 ng/mL    Comment: CRITICAL RESULT CALLED TO, READ BACK BY AND VERIFIED WITH: MCLEOD O,RN 06/23/18 39[address] [facility] Performed at [facility], [place] [address] [place], [place] [address]   Lipase, blood     Status: Abnormal   Collection Time: 06/23/18  5:00 AM  Result Value Ref Range   Lipase 77 (H) 11 - 51 U/L    Comment: Performed at [facility] [address] [place], [place] [address]  Glucose, capillary     Status: Abnormal   Collection Time: 06/23/18  8:38 AM  Result Value Ref Range   Glucose-Capillary 153 (H) 70 - 99 mg/dL  Troponin I     Status: Abnormal   Collection Time: 06/23/18 10:15 AM  Result Value Ref Range   Troponin I 0.03 (HH) <0.03 ng/mL    Comment: CRITICAL VALUE NOTED.  VALUE IS CONSISTENT WITH PREVIOUSLY REPORTED AND CALLED VALUE. Performed at [facility], [place] [address], [place], [place] [address]   Glucose, capillary     Status: Abnormal  Collection Time: 06/23/18 12:38 PM  Result Value Ref Range   Glucose-Capillary 124 (H) 70 - 99 mg/dL   Dg Chest 2 View  Result Date: 06/22/2018 CLINICAL  DATA:  Chest pain and abdominal pain EXAM: CHEST - 2 VIEW COMPARISON:  06/01/2018 FINDINGS: Cardiac shadow is again enlarged but stable. Mild vascular congestion is noted without significant edema. No focal infiltrate is seen. No acute bony abnormality is noted. Chronic changes about both shoulder joints are seen. IMPRESSION: Cardiomegaly and mild congestive changes. Electronically Signed   By: Inez Catalina [name]   On: 06/22/2018 22:26   US Abdomen Complete  Result Date: 06/23/2018 CLINICAL DATA:  Abdominal pain for 3 days. History of appendectomy, hysterectomy. EXAM: ABDOMEN ULTRASOUND COMPLETE COMPARISON:  Abdominal ultrasound November 30, 2016 and CT abdomen and pelvis September 11, 2015. FINDINGS: Gallbladder: Mild distended gallbladder with punctate echogenic layering gallbladder sludge and cholelithiasis measuring to 4 mm. No gallbladder wall thickening or pericholecystic fluid. No sonographic Murphy sign elicited. Common bile duct: Diameter: Dilated common bile duct measuring to 15 mm with suspected sludge. Liver: No focal lesion identified. Mild intrahepatic biliary dilatation. Within normal limits in parenchymal echogenicity. Portal vein is patent on color Doppler imaging with normal direction of blood flow towards the liver. IVC: No abnormality visualized. Pancreas: Visualized portion unremarkable. Spleen: Size and appearance within normal limits. Right Kidney: Length: 10.5 cm. Mildly increased cortical echogenicity. No solid mass or hydronephrosis visualized. 5.2 cm homogeneously anechoic cyst upper pole RIGHT kidney. Left Kidney: Length: 11.4 cm. Mildly increased cortical echogenicity. No solid mass or hydronephrosis visualized. Abdominal aorta: No aneurysm visualized. Echogenic calcific atherosclerosis. Other findings: None. IMPRESSION: 1. Cholelithiasis and sludge without sonographic findings of acute cholecystitis. 2. Biliary dilatation with potential choledocholithiasis/sludge. MRCP may be more  definitive. 3. Mildly echogenic kidneys seen with medical renal disease. Aortic Atherosclerosis (ICD10-I70.0). Electronically Signed   By: Elon Alas [name]   On: 06/23/2018 00:37   Mr Abdomen Mrcp Wo Contrast  Result Date: 06/23/2018 CLINICAL DATA:  Epigastric pain. Elevated LFTs a gallstone. Concern for common bile duct stone. EXAM: MRI ABDOMEN WITHOUT CONTRAST  (INCLUDING MRCP) TECHNIQUE: Multiplanar multisequence MR imaging of the abdomen was performed. Heavily T2-weighted images of the biliary and pancreatic ducts were obtained, and three-dimensional MRCP images were rendered by post processing. COMPARISON:  Ultrasound 06/22/2018 FINDINGS: Lower chest:  Lung bases are clear. Hepatobiliary: No intrahepatic biliary duct dilatation. Gallbladder nondistended. Sludge in gallstones described on comparison ultrasound are difficult to define. No intrahepatic duct dilatation. The common hepatic duct measures 14 mm. Duct tapers to 9 mm through the common bile duct. Duct measures 9 mm at the ampullary region. No evidence clear stone within the common bile duct. There is some dependent material within the common bile duct through the pancreatic head region (image 24/8). This may represent sludge. No obstructing lesion at the ampulla. Potential sludge within the distal common bile duct (image 25/8) layers dependently. Pancreas: Pancreatic duct is nondilated. No pancreatic inflammation. Spleen: Normal spleen. Adrenals/urinary tract: Adrenal glands normal. Benign-appearing cyst of the RIGHT kidney Stomach/Bowel: Stomach and limited of the small bowel is unremarkable Vascular/Lymphatic: Abdominal aortic normal caliber. No retroperitoneal periportal lymphadenopathy. Musculoskeletal: No aggressive osseous lesion IMPRESSION: 1. Mild dilatation of the common hepatic duct and common bile duct. No obstructing lesion is identified. Small amount sludge layers dependently within the common bile duct. 2. No gallstones evident.  Gallstones and sludge identified on comparison ultrasound. 3. No intrahepatic duct dilatation. 4. No pancreatic inflammation Electronically Signed  By: Suzy Bouchard [name]   On: 06/23/2018 12:22   Mr 3d Recon At Scanner  Result Date: 06/23/2018 CLINICAL DATA:  Epigastric pain. Elevated LFTs a gallstone. Concern for common bile duct stone. EXAM: MRI ABDOMEN WITHOUT CONTRAST  (INCLUDING MRCP) TECHNIQUE: Multiplanar multisequence MR imaging of the abdomen was performed. Heavily T2-weighted images of the biliary and pancreatic ducts were obtained, and three-dimensional MRCP images were rendered by post processing. COMPARISON:  Ultrasound 06/22/2018 FINDINGS: Lower chest:  Lung bases are clear. Hepatobiliary: No intrahepatic biliary duct dilatation. Gallbladder nondistended. Sludge in gallstones described on comparison ultrasound are difficult to define. No intrahepatic duct dilatation. The common hepatic duct measures 14 mm. Duct tapers to 9 mm through the common bile duct. Duct measures 9 mm at the ampullary region. No evidence clear stone within the common bile duct. There is some dependent material within the common bile duct through the pancreatic head region (image 24/8). This may represent sludge. No obstructing lesion at the ampulla. Potential sludge within the distal common bile duct (image 25/8) layers dependently. Pancreas: Pancreatic duct is nondilated. No pancreatic inflammation. Spleen: Normal spleen. Adrenals/urinary tract: Adrenal glands normal. Benign-appearing cyst of the RIGHT kidney Stomach/Bowel: Stomach and limited of the small bowel is unremarkable Vascular/Lymphatic: Abdominal aortic normal caliber. No retroperitoneal periportal lymphadenopathy. Musculoskeletal: No aggressive osseous lesion IMPRESSION: 1. Mild dilatation of the common hepatic duct and common bile duct. No obstructing lesion is identified. Small amount sludge layers dependently within the common bile duct. 2. No gallstones  evident. Gallstones and sludge identified on comparison ultrasound. 3. No intrahepatic duct dilatation. 4. No pancreatic inflammation Electronically Signed   By: Suzy Bouchard [name]   On: 06/23/2018 12:22      Assessment/Plan  Active Problems:   Rheumatoid arthritis involving multiple sites ([place])   Anemia of chronic disease   CKD stage 3 due to type 2 diabetes mellitus (HCC)   DM (diabetes mellitus), type 2 with renal complications ([facility])   DCM (dilated cardiomyopathy) ([facility])   Uncontrolled hypertension   Abdominal pain   Cholelithiasis   Elevated LFTs  Gallstone pancreatitis - Tbili now WNL, LFT's and alk phos still elevated - sludge seen on MRCP in CBD - pt will need cardiac clearance prior to surgery - allow clears, NPO at midnight   Possible OR tomorrow pending cardiac clearance.   Thank you for the consult.   Kalman Drape, [facility] 06/23/2018, 3:16 PM Pager: [phone] Consults: [phone] Mon-Fri 7:00 am-4:30 pm Sat-Sun 7:00 am-11:30 am

## 2018-06-23 NOTE — Progress Notes (Signed)
Pharmacy Antibiotic Note  [name] [name] is a 84 y.o. female admitted on 06/22/2018 with intra-abdominal infection.  Pharmacy has been consulted for Zosyn dosing. WBC WNL. CrCl ~35.   Plan: Zosyn 3.375G IV q8h to be infused over 4 hours Trend WBC, temp, renal function  F/U infectious work-up  Height: 5\' 4"  (162.6 cm) Weight: 174 lb (78.9 kg) IBW/kg (Calculated) : 54.7  Temp (24hrs), Avg:98 F (36.7 C), Min:97.8 F (36.6 C), Max:98.1 F (36.7 C)  Recent Labs  Lab 06/22/18 2153  WBC 7.7  CREATININE 1.16*    Estimated Creatinine Clearance: 35.4 mL/min (A) (by C-G formula based on SCr of 1.16 mg/dL (H)).    Allergies  Allergen Reactions  . Clonidine Derivatives Swelling    [name]  . Fish Allergy Diarrhea, Swelling and Other (See Comments)    Turns skin "black," but can tolerate white fish Salmon- Diarrhea  . Shellfish Allergy Hives  . Doxycycline Rash  . Indomethacin Other (See Comments)    Reaction not recalled by the patient  . Lyrica [Pregabalin] Other (See Comments)    Hallucinations   . Methyldopa Other (See Comments)    Aldomet (for hypertension): Reaction not recalled by the patient  . Orange Fruit [Citrus] Other (See Comments)    Indigestion/heartburn  . Cetirizine Hcl Itching and Rash  . Codeine Itching  . Levaquin [Levofloxacin In D5w] Rash  . Tomato Rash    2154 06/23/2018 4:56 AM

## 2018-06-23 NOTE — Progress Notes (Signed)
Requested 1000 zosyn from pharmacy  Awaiting dose   Educated pt and family on antibiotic treatment and new medications

## 2018-06-23 NOTE — Consult Note (Signed)
Cardiology Consult    Patient ID: BETSEY SOSSAMON MRN: [id number], DOB/AGE: 82/29/1931   Admit date: 06/22/2018 Date of Consult: 06/23/2018  Primary Physician: Helane Rima, DO Primary Cardiologist: Dr. Duke Salvia Requesting Provider: Dr. Mahala Menghini Reason for Consultation: Pre op Eval  DELIAH STREHLOW is a 84 y.o. female who is being seen today for the evaluation of pre op eval at the request of Dr. Mahala Menghini.  Patient Profile    84 yo female with PMH of CAD s/p remote MI, pulmonary HTN, RA, DM, PVD and chronic combined HF who presented with abd pain and found to have gallstone pancreatitis.   Past Medical History   Past Medical History:  Diagnosis Date  . Allergic rhinitis due to pollen 04/27/2007  . Arthritis    "in q joint" (08/07/2013)  . CHF (congestive heart failure) (HCC)   . Coronary atherosclerosis of native coronary artery   . Cough   . Disorder of bone and cartilage, unspecified   . Edema 05/03/2013  . Exertional shortness of breath    "sometimes" (08/07/2013)  . First degree atrioventricular block   . Gout, unspecified 04/19/2013  . Hyperlipidemia 04/27/2007  . Hypertension   . Muscle spasm   . Muscle weakness (generalized)   . Myocardial infarction (HCC) ~ 1970  . Onychia and paronychia of toe   . Osteoarthrosis, unspecified whether generalized or localized, unspecified site 04/27/2007  . Other fall   . Other malaise and fatigue   . Pneumonia 05/2018  . Prepatellar bursitis   . Seizures (HCC)   . Stroke (HCC) 01/17/2014  . TIA (transient ischemic attack)    "a few one summer" (08/07/2013)  . Type II diabetes mellitus (HCC)    "fasting 90-110s" (08/07/2013)  . Unspecified essential hypertension 08/08/2013  . Unspecified vitamin D deficiency     Past Surgical History:  Procedure Laterality Date  . ABDOMINAL HYSTERECTOMY  04/1980  . APPENDECTOMY  1960  . CARDIAC CATHETERIZATION  2003  . CATARACT EXTRACTION W/ INTRAOCULAR LENS  IMPLANT, BILATERAL  Bilateral ~ 2012  . JOINT REPLACEMENT    . REPLACEMENT TOTAL KNEE Right 09/2005  . RIGHT/LEFT HEART CATH AND CORONARY ANGIOGRAPHY N/A 06/01/2018   Procedure: RIGHT/LEFT HEART CATH AND CORONARY ANGIOGRAPHY;  Surgeon: Corky Crafts, MD;  Location: [facility] INVASIVE CV LAB;  Service: Cardiovascular;  Laterality: N/A;  . SHOULDER OPEN ROTATOR CUFF REPAIR Right 07/1999  . TONSILLECTOMY  08/1974  . TOTAL KNEE ARTHROPLASTY Left 08/07/2013   Procedure: TOTAL KNEE ARTHROPLASTY- LEFT;  Surgeon: Dannielle Huh, MD;  Location: [facility] OR;  Service: Orthopedics;  Laterality: Left;  . TRANSTHORACIC ECHOCARDIOGRAM  2003   EF 55-65%; mild concentric LVH     Allergies  Allergies  Allergen Reactions  . Clonidine Derivatives Swelling    [name]  . Fish Allergy Diarrhea, Swelling and Other (See Comments)    Turns skin "black," but can tolerate white fish Salmon- Diarrhea  . Shellfish Allergy Hives  . Doxycycline Rash  . Indomethacin Other (See Comments)    Reaction not recalled by the patient  . Lyrica [Pregabalin] Other (See Comments)    Hallucinations   . Methyldopa Other (See Comments)    Aldomet (for hypertension): Reaction not recalled by the patient  . Orange Fruit [Citrus] Other (See Comments)    Indigestion/heartburn  . Cetirizine Hcl Itching and Rash  . Codeine Itching  . Levaquin [Levofloxacin In D5w] Rash  . Tomato Rash  History of Present Illness    Mrs. Deloria is a 84 yo female with PMH of CAD s/p remote MI, pulmonary HTN, RA, DM, PVD and chronic combined HF. She was recently admitted back in July for CHF and CAP. Echo done that admission showed new EF of 20-25%. Decision made to take for cath which showed mild nonobstructive CAD. She was diuresed with IV lasix. Discharged to a SNF for rehab on lasix 20mg  daily, spiro 25mg  daily, losartan 100mg  daily, coreg 25mg  BID, hydralazine 100mg  TID. She was seen back in the  office on 06/15/18 for follow up. Reported still being in rehab, but struggling with diet as they were not serving her a low sodium diet. She was continued on her medication regimen. States she was sent home from SNF on Friday of last week. Telephone notes indicate daughter called in reporting her weight was up 3lbs. Instructed to increase her lasix to 40mg  daily for 2 days. Patient reports she felt weak but was doing ok. Developed RUQ abd pain about 3 days prior to admission. Was unable to eat and nauseated. Daughter brought her to the ED.   In the ED her labs showed elevated lipase, AST and ALT. US of the abd showed gallstones with possible dilation concerning for choledocholithiasis. Electrolytes were stable, Cr 1.1, BNP 1146, Trop neg x3, Hgb 10.5. CXR noted mild edema. She was given IV lasix in the ED. Internal medicine called for admission. Seen in consult by general surgery with plans for lap chole. Cardiology called for pre op evaluation.   Inpatient Medications    . allopurinol  100 mg Oral Daily  . carvedilol  25 mg Oral BID WC  . gabapentin  100 mg Oral QHS  . hydrALAZINE  100 mg Oral TID  . hydrocortisone sod succinate (SOLU-CORTEF) inj  50 mg Intravenous Q8H  . insulin aspart  0-9 Units Subcutaneous TID WC  . isosorbide mononitrate  30 mg Oral Daily    Family History    Family History  Problem Relation Age of Onset  . Heart attack Father     Social History    Social History   Socioeconomic History  . Marital status: Widowed    Spouse name: Not on file  . Number of children: Not on file  . Years of education: Not on file  . Highest education level: Not on file  Occupational History  . Not on file  Social Needs  . Financial resource strain: Not on file  . Food insecurity:    Worry: Not on file    Inability: Not on file  . Transportation needs:    Medical: Not on file    Non-medical: Not on file  Tobacco Use  . Smoking status: Never Smoker  . Smokeless tobacco:  Never Used  Substance and Sexual Activity  . Alcohol use: No    Alcohol/week: 0.0 standard drinks  . Drug use: No  . Sexual activity: Never  Lifestyle  . Physical activity:    Days per week: Not on file    Minutes per session: Not on file  . Stress: Not on file  Relationships  . Social connections:    Talks on phone: Not on file    Gets together: Not on file    Attends religious service: Not on file    Active member of club or organization: Not on file    Attends meetings of clubs or organizations: Not on file    Relationship status: Not on file  .  Intimate partner violence:    Fear of current or ex partner: Not on file    Emotionally abused: Not on file    Physically abused: Not on file    Forced sexual activity: Not on file  Other Topics Concern  . Not on file  Social History Narrative   Widowed   Walks with cane     Review of Systems    See HPI  All other systems reviewed and are otherwise negative except as noted above.  Physical Exam    Blood pressure 112/71, pulse 87, temperature 97.9 F (36.6 C), temperature source Oral, resp. rate 16, height 5\' 4"  (1.626 m), weight 78.9 kg, SpO2 96 %.  General: Pleasant, older AAF, NAD Psych: Normal affect. Neuro: Alert and oriented X 3. Moves all extremities spontaneously. HEENT: Normal  Neck: Supple, mild JVD. Lungs:  Resp regular and unlabored, diminished in lower lobes. Heart: RRR soft systolic murmurs. Abdomen: Soft, but tender, non-distended, BS + x 4.  Extremities: No clubbing, cyanosis or edema. DP/PT/Radials 2+ and equal bilaterally.  Labs    Troponin (Point of Care Test) No results for input(s): TROPIPOC in the last 72 hours. Recent Labs    06/22/18 2153 06/23/18 0500 06/23/18 1015  TROPONINI <0.03 0.03* 0.03*   Lab Results  Component Value Date   WBC 8.3 06/23/2018   HGB 10.3 (L) 06/23/2018   HCT 31.8 (L) 06/23/2018   MCV 90.6 06/23/2018   PLT 306 06/23/2018    Recent Labs  Lab 06/23/18 0500    NA 139  K 3.7  CL 103  CO2 27  BUN 18  CREATININE 1.18*  CALCIUM 9.1  PROT 6.6  BILITOT 1.2  ALKPHOS 249*  ALT 204*  AST 120*  GLUCOSE 89   Lab Results  Component Value Date   CHOL 178 05/29/2018   HDL 73 05/29/2018   LDLCALC 92 05/29/2018   TRIG 64 05/29/2018   No results found for: [facility]   Radiology Studies    Dg Chest 2 View  Result Date: 06/22/2018 CLINICAL DATA:  Chest pain and abdominal pain EXAM: CHEST - 2 VIEW COMPARISON:  06/01/2018 FINDINGS: Cardiac shadow is again enlarged but stable. Mild vascular congestion is noted without significant edema. No focal infiltrate is seen. No acute bony abnormality is noted. Chronic changes about both shoulder joints are seen. IMPRESSION: Cardiomegaly and mild congestive changes. Electronically Signed   By: 06/03/2018 [name]   On: 06/22/2018 22:26   Dg Chest 2 View  Result Date: 05/28/2018 CLINICAL DATA:  Weakness 6 days with intermittent altered mental status. EXAM: CHEST - 2 VIEW COMPARISON:  11/16/2017 FINDINGS: Lungs are adequately inflated demonstrate airspace consolidation over the left midlung likely left upper lobe pneumonia. No evidence of effusion. Slight worsening moderate cardiomegaly. Remainder of the exam is unchanged IMPRESSION: Mild airspace opacification over the left upper lobe likely pneumonia. Slight worsening moderate cardiomegaly. Electronically Signed   By: 01/14/2018 [name]   On: 05/28/2018 16:10   Ct Head Wo Contrast  Result Date: 05/29/2018 CLINICAL DATA:  Drowsiness with RIGHT facial droop. EXAM: CT HEAD WITHOUT CONTRAST TECHNIQUE: Contiguous axial images were obtained from the base of the skull through the vertex without intravenous contrast. COMPARISON:  05/28/2018. FINDINGS: Brain: No evidence for acute infarction, hemorrhage, mass lesion, hydrocephalus, or extra-axial fluid. Generalized atrophy. Moderate small vessel disease. Vascular: Calcification of the cavernous internal carotid arteries consistent  with cerebrovascular atherosclerotic disease. No signs of intracranial large vessel occlusion. Skull: Calvarium intact.  Sinuses/Orbits: Clear sinuses.  No orbital findings. Other: No mastoid fluid. IMPRESSION: Atrophy and white matter disease, stable in appearance from 05/28/2018. No acute intracranial findings. Electronically Signed   By: Elsie Stain [name]   On: 05/29/2018 21:44   Ct Head Wo Contrast  Result Date: 05/28/2018 CLINICAL DATA:  Generalized weakness since 05/22/2018. Intermittent altered mental status. EXAM: CT HEAD WITHOUT CONTRAST TECHNIQUE: Contiguous axial images were obtained from the base of the skull through the vertex without intravenous contrast. COMPARISON:  11/29/2016. FINDINGS: Brain: Diffusely enlarged ventricles and subarachnoid spaces. Patchy white matter low density in both cerebral hemispheres. No intracranial hemorrhage, mass lesion or CT evidence of acute infarction. Vascular: No hyperdense vessel or unexpected calcification. Skull: Bilateral hyperostosis frontalis. Sinuses/Orbits: Status post bilateral cataract extraction. Single residual opacified left ethmoid air cell. Resolved right mastoid air cell opacification. Other: None. IMPRESSION: 1. No acute abnormality. 2. Stable atrophy. 3. Progressive chronic small vessel white matter ischemic changes in both cerebral hemispheres. 4. Improved left ethmoid sinusitis. 5. Resolved right mastoid mucosal thickening and fluid. Electronically Signed   By: Beckie Salts [name]   On: 05/28/2018 15:56   Ct Angio Chest Pe W And/or Wo Contrast  Result Date: 05/28/2018 CLINICAL DATA:  Shortness of breath. EXAM: CT ANGIOGRAPHY CHEST WITH CONTRAST TECHNIQUE: Multidetector CT imaging of the chest was performed using the standard protocol during bolus administration of intravenous contrast. Multiplanar CT image reconstructions and MIPs were obtained to evaluate the vascular anatomy. CONTRAST:  100 mL ISOVUE-370 IOPAMIDOL (ISOVUE-370) INJECTION  76% COMPARISON:  Chest CT angiogram September 11, 2015; chest radiograph May 28, 2018 FINDINGS: Cardiovascular: There is no demonstrable pulmonary embolus. There is no thoracic aortic aneurysm. No dissection is seen in the thoracic aorta. Note that the contrast bolus in the aorta is less than optimal for assessment for potential dissection. Visualized great vessels appear unremarkable except for slight calcification in the left common carotid artery. Note that the left common carotid and right innominate arteries arise as a common trunk, an anatomic variant. There is no appreciable pericardial effusion or pericardial thickening. There is aortic atherosclerosis. There are foci of coronary artery calcification. Mediastinum/Nodes: There is a subcentimeter nodular opacity in the left lobe of the thyroid. No dominant thyroid mass evident. There is no evident thoracic adenopathy. No esophageal lesions are appreciable. Lungs/Pleura: There is airspace consolidation in the anterior segment left upper lobe consistent with pneumonia. There is atelectatic change in each lower lobe with a small right pleural effusion evident. On axial slice 69 series 11, there is a 5 mm nodular opacity in the lateral segment of the right middle lobe. There is a nodular opacity in the anterior segment of the right upper lobe measuring 1.5 x 1.1 cm. This nodular opacity is best seen on coronal slice 88 series 7 but is also appreciable on sagittal slice 61 series 8 and axial slice 23 series 11. Upper Abdomen: In the visualized upper abdominal region, there is mild reflux into the inferior vena cava and hepatic veins suggesting increase in right heart pressure. There is atherosclerotic calcification in the aorta and proximal major mesenteric arterial vessels. There is a cyst arising from the lateral upper pole right kidney measuring 5.2 x 5.1 cm. Musculoskeletal: There is degenerative change in the thoracic spine. There are no blastic or lytic bone  lesions. There is extensive arthropathy in each shoulder. No evident chest wall lesions. Review of the MIP images confirms the above findings. IMPRESSION: 1. There is a 1.5 x 1.1  cm nodular opacity in the anterior segment of the right upper lobe. Consider one of the following in 3 months for both low-risk and high-risk individuals: (a) repeat chest CT, (b) follow-up PET-CT, or (c) tissue sampling. This recommendation follows the consensus statement: Guidelines for Management of Incidental Pulmonary Nodules Detected on CT Images: From the Fleischner Society 2017; Radiology 2017; 284:228-243. 2. Anterior segment left upper lobe airspace consolidation consistent with pneumonia. 3. No demonstrable pulmonary embolus. No thoracic aortic aneurysm. No dissection seen. It should be noted that the contrast bolus in the aorta is insufficient to exclude dissection confidently as a differential consideration by radiography. 4. There is aortic atherosclerosis. There are foci of coronary artery calcification. There are foci of great vessel and mesenteric arterial vessel calcification as well. 5.  Small right pleural effusion with bibasilar atelectasis. 6. Subcentimeter left thyroid nodule. Per consensus guidelines, this lesion does not warrant additional imaging surveillance. 7.  No demonstrable thoracic adenopathy. 8. Reflux into the inferior vena cava and hepatic veins may be indicative of a degree of increase in right heart pressure. Aortic Atherosclerosis (ICD10-I70.0). Electronically Signed   By: Bretta Bang III [name]   On: 05/28/2018 19:08   US Abdomen Complete  Result Date: 06/23/2018 CLINICAL DATA:  Abdominal pain for 3 days. History of appendectomy, hysterectomy. EXAM: ABDOMEN ULTRASOUND COMPLETE COMPARISON:  Abdominal ultrasound November 30, 2016 and CT abdomen and pelvis September 11, 2015. FINDINGS: Gallbladder: Mild distended gallbladder with punctate echogenic layering gallbladder sludge and cholelithiasis  measuring to 4 mm. No gallbladder wall thickening or pericholecystic fluid. No sonographic Murphy sign elicited. Common bile duct: Diameter: Dilated common bile duct measuring to 15 mm with suspected sludge. Liver: No focal lesion identified. Mild intrahepatic biliary dilatation. Within normal limits in parenchymal echogenicity. Portal vein is patent on color Doppler imaging with normal direction of blood flow towards the liver. IVC: No abnormality visualized. Pancreas: Visualized portion unremarkable. Spleen: Size and appearance within normal limits. Right Kidney: Length: 10.5 cm. Mildly increased cortical echogenicity. No solid mass or hydronephrosis visualized. 5.2 cm homogeneously anechoic cyst upper pole RIGHT kidney. Left Kidney: Length: 11.4 cm. Mildly increased cortical echogenicity. No solid mass or hydronephrosis visualized. Abdominal aorta: No aneurysm visualized. Echogenic calcific atherosclerosis. Other findings: None. IMPRESSION: 1. Cholelithiasis and sludge without sonographic findings of acute cholecystitis. 2. Biliary dilatation with potential choledocholithiasis/sludge. MRCP may be more definitive. 3. Mildly echogenic kidneys seen with medical renal disease. Aortic Atherosclerosis (ICD10-I70.0). Electronically Signed   By: Awilda Metro [name]   On: 06/23/2018 00:37   Mr Abdomen Mrcp Wo Contrast  Result Date: 06/23/2018 CLINICAL DATA:  Epigastric pain. Elevated LFTs a gallstone. Concern for common bile duct stone. EXAM: MRI ABDOMEN WITHOUT CONTRAST  (INCLUDING MRCP) TECHNIQUE: Multiplanar multisequence MR imaging of the abdomen was performed. Heavily T2-weighted images of the biliary and pancreatic ducts were obtained, and three-dimensional MRCP images were rendered by post processing. COMPARISON:  Ultrasound 06/22/2018 FINDINGS: Lower chest:  Lung bases are clear. Hepatobiliary: No intrahepatic biliary duct dilatation. Gallbladder nondistended. Sludge in gallstones described on comparison  ultrasound are difficult to define. No intrahepatic duct dilatation. The common hepatic duct measures 14 mm. Duct tapers to 9 mm through the common bile duct. Duct measures 9 mm at the ampullary region. No evidence clear stone within the common bile duct. There is some dependent material within the common bile duct through the pancreatic head region (image 24/8). This may represent sludge. No obstructing lesion at the ampulla. Potential sludge within the distal  common bile duct (image 25/8) layers dependently. Pancreas: Pancreatic duct is nondilated. No pancreatic inflammation. Spleen: Normal spleen. Adrenals/urinary tract: Adrenal glands normal. Benign-appearing cyst of the RIGHT kidney Stomach/Bowel: Stomach and limited of the small bowel is unremarkable Vascular/Lymphatic: Abdominal aortic normal caliber. No retroperitoneal periportal lymphadenopathy. Musculoskeletal: No aggressive osseous lesion IMPRESSION: 1. Mild dilatation of the common hepatic duct and common bile duct. No obstructing lesion is identified. Small amount sludge layers dependently within the common bile duct. 2. No gallstones evident. Gallstones and sludge identified on comparison ultrasound. 3. No intrahepatic duct dilatation. 4. No pancreatic inflammation Electronically Signed   By: Genevive Bi [name]   On: 06/23/2018 12:22   Mr 3d Recon At Scanner  Result Date: 06/23/2018 CLINICAL DATA:  Epigastric pain. Elevated LFTs a gallstone. Concern for common bile duct stone. EXAM: MRI ABDOMEN WITHOUT CONTRAST  (INCLUDING MRCP) TECHNIQUE: Multiplanar multisequence MR imaging of the abdomen was performed. Heavily T2-weighted images of the biliary and pancreatic ducts were obtained, and three-dimensional MRCP images were rendered by post processing. COMPARISON:  Ultrasound 06/22/2018 FINDINGS: Lower chest:  Lung bases are clear. Hepatobiliary: No intrahepatic biliary duct dilatation. Gallbladder nondistended. Sludge in gallstones described on  comparison ultrasound are difficult to define. No intrahepatic duct dilatation. The common hepatic duct measures 14 mm. Duct tapers to 9 mm through the common bile duct. Duct measures 9 mm at the ampullary region. No evidence clear stone within the common bile duct. There is some dependent material within the common bile duct through the pancreatic head region (image 24/8). This may represent sludge. No obstructing lesion at the ampulla. Potential sludge within the distal common bile duct (image 25/8) layers dependently. Pancreas: Pancreatic duct is nondilated. No pancreatic inflammation. Spleen: Normal spleen. Adrenals/urinary tract: Adrenal glands normal. Benign-appearing cyst of the RIGHT kidney Stomach/Bowel: Stomach and limited of the small bowel is unremarkable Vascular/Lymphatic: Abdominal aortic normal caliber. No retroperitoneal periportal lymphadenopathy. Musculoskeletal: No aggressive osseous lesion IMPRESSION: 1. Mild dilatation of the common hepatic duct and common bile duct. No obstructing lesion is identified. Small amount sludge layers dependently within the common bile duct. 2. No gallstones evident. Gallstones and sludge identified on comparison ultrasound. 3. No intrahepatic duct dilatation. 4. No pancreatic inflammation Electronically Signed   By: Genevive Bi [name]   On: 06/23/2018 12:22   Dg Chest Port 1 View  Result Date: 06/01/2018 CLINICAL DATA:  Recent pneumonia EXAM: PORTABLE CHEST 1 VIEW COMPARISON:  Chest radiograph and chest CT May 28, 2018 FINDINGS: There has been interval clearing of infiltrate from the left upper lobe. Mild atelectatic change remains in this area. There is no new opacity elsewhere. There is cardiomegaly with mild pulmonary venous hypertension. No adenopathy. There is advanced arthropathy in both shoulders. There is postoperative change in the right shoulder region. IMPRESSION: Interval clearing of infiltrate from left upper lobe. Mild atelectasis remains in  this area. No new opacity. There is a degree of underlying pulmonary vascular congestion. There is advanced arthropathy in both shoulders with evidence suggesting avascular necrosis in each humeral head. Note that the nodular opacity in the right upper lobe seen on recent chest CT is not appreciable by radiography. Please see recommendations with respect to recent chest CT report. Electronically Signed   By: Bretta Bang III [name]   On: 06/01/2018 07:09    ECG & Cardiac Imaging    EKG:  The EKG was personally reviewed and demonstrates SR  Echo: 05/29/18  Study Conclusions  - Left ventricle: The cavity  size was normal. Wall thickness was   increased in a pattern of moderate LVH. Systolic function was   severely reduced. The estimated ejection fraction was in the   range of 20% to 25%. Although no diagnostic regional wall motion   abnormality was identified, this possibility cannot be completely   excluded on the basis of this study. - Aortic valve: Mildly calcified annulus. Trileaflet; normal   thickness, moderately calcified leaflets. There was mild   regurgitation. - Mitral valve: Mildly calcified annulus. Mildly calcified leaflets   . There was mild to moderate regurgitation. - Left atrium: The atrium was moderately to severely dilated. - Right ventricle: The cavity size was mildly dilated. Wall   thickness was normal. Systolic function was mildly reduced. - Right atrium: The atrium was moderately dilated. - Atrial septum: There was a small patent foramen ovale. There was   a left-to-right shunt. - Tricuspid valve: There was moderate regurgitation. - Pulmonary arteries: Systolic pressure was moderately increased.   PA peak pressure: 47 mm Hg (S). - Pericardium, extracardiac: A trivial pericardial effusion was   identified.  Cath: 06/01/18    LV end diastolic pressure is moderately elevated.  There is no aortic valve stenosis.  Hemodynamic findings consistent with  moderate pulmonary hypertension.  CO 5.68 L/min; CI 2.97; Ao sat 99%; PA sat 65%; mean PA 42 mm Hg; mean PCWP 28 mm Hg  Nonobstructive CAD.   Nonischemic cardiomyopathy with mild to moderate CAD.    If there are no bleeding contraindications: Recommend Aspirin 81mg  daily for moderate CAD.   She will benefit from diuresis.   Assessment & Plan    84 yo female with PMH of CAD s/p remote MI, pulmonary HTN, RA, DM, PVD and chronic combined HF who presented with abd pain and found to have gallstone pancreatitis.   Pre op evaluation: Reports being home from SNF since Friday of last week. Has been weak but overall felt well. She feels her breathing has been at baseline. Did note she had to increase her lasix to 40mg  for 2 days. Had a cath on 06/01/18 with non obstructive CAD, but EF severely decreased at 20-25%. Given IV lasix in the ED. She is laying comfortably in bed at the time of exam. Would be cautious with IVFs with surgery given her low EF. No indication for further work up at this time.   NICM: maintained her volume status with lasix 20mg  po at home prior to admission. Would resume lasix post op in order to prevent volume overload.   Abd pain with gallstone pancreatis: elevated LFTs, lipase and bilirubin on admission. MRCP today with dilated common bile duct with gallstone ans sludge. Seen by surgery with plans for lap chole tomorrow.   Janice Coffin, NP-C Pager [phone] 06/23/2018, 5:20 PM

## 2018-06-23 NOTE — Progress Notes (Signed)
Agree with plan assessment and HPI as per my partner dictated earlier this morning  84 year old female CKD stage III 2/2 DM TY 2 Dilated cardiomyopathy EF 20-30%-recent increase in Lasix dose HTN Rheumatoid arthritis on prednisone previously on Humira Xeljanz Bactrim Orencia--seen by Dr. Eber Hong Chronic lower extremity wounds with PAD in the past by vascular surgery ABIs 11/2017 Prior CVA  Last admitted 05/2018 with new onset heart failure cath at that time showed moderately elevated LVEDP and moderate pulmonary hypertension CO 5.68/CI 2.9-- A small PFO was noted with left-to-right shunt.  Return to hospital 8/14 with abdominal pain, nausea, vomiting  alk phos 240 lipase 150 AST/ALT 119/203 direct bili 2.0 BNP 1146 troponin 0 0.03-MRCP confirm any other findings  I discussed with gastroenterology and they can be available if needed General surgery will be seeing the patient in consult for possible cholecystectomy Cardiology has been consulted given recent admission for acute systolic heart failure in terms of clearance for surgery--note that she just recently had a cath significant mainly for pulmonary hypertension without any specific CAD and it was a nonischemic cardiomyopathy   Discussed fully with daughter at the bedside  Verneita Griffes, MD [facility] 2:34 PM

## 2018-06-24 ENCOUNTER — Inpatient Hospital Stay (HOSPITAL_COMMUNITY): Payer: Medicare Other

## 2018-06-24 ENCOUNTER — Telehealth: Payer: Self-pay

## 2018-06-24 ENCOUNTER — Inpatient Hospital Stay: Payer: Medicare Other | Admitting: Family Medicine

## 2018-06-24 DIAGNOSIS — R531 Weakness: Secondary | ICD-10-CM

## 2018-06-24 DIAGNOSIS — I5043 Acute on chronic combined systolic (congestive) and diastolic (congestive) heart failure: Secondary | ICD-10-CM

## 2018-06-24 DIAGNOSIS — I10 Essential (primary) hypertension: Secondary | ICD-10-CM

## 2018-06-24 DIAGNOSIS — I6330 Cerebral infarction due to thrombosis of unspecified cerebral artery: Secondary | ICD-10-CM

## 2018-06-24 DIAGNOSIS — J810 Acute pulmonary edema: Secondary | ICD-10-CM

## 2018-06-24 DIAGNOSIS — I340 Nonrheumatic mitral (valve) insufficiency: Secondary | ICD-10-CM

## 2018-06-24 LAB — GLUCOSE, CAPILLARY
Glucose-Capillary: 162 mg/dL — ABNORMAL HIGH (ref 70–99)
Glucose-Capillary: 142 mg/dL — ABNORMAL HIGH (ref 70–99)
Glucose-Capillary: 148 mg/dL — ABNORMAL HIGH (ref 70–99)
Glucose-Capillary: 141 mg/dL — ABNORMAL HIGH (ref 70–99)

## 2018-06-24 LAB — COMPREHENSIVE METABOLIC PANEL
ALT: 140 U/L — ABNORMAL HIGH (ref 0–44)
AST: 43 U/L — ABNORMAL HIGH (ref 15–41)
Albumin: 3 g/dL — ABNORMAL LOW (ref 3.5–5.0)
Alkaline Phosphatase: 225 U/L — ABNORMAL HIGH (ref 38–126)
Anion gap: 10 (ref 5–15)
BUN: 17 mg/dL (ref 8–23)
CO2: 27 mmol/L (ref 22–32)
Calcium: 8.9 mg/dL (ref 8.9–10.3)
Chloride: 100 mmol/L (ref 98–111)
Creatinine, Ser: 1.21 mg/dL — ABNORMAL HIGH (ref 0.44–1.00)
GFR calc Af Amer: 46 mL/min — ABNORMAL LOW (ref >60)
GFR calc non Af Amer: 39 mL/min — ABNORMAL LOW (ref >60)
Glucose, Bld: 159 mg/dL — ABNORMAL HIGH (ref 70–99)
Potassium: 3.5 mmol/L (ref 3.5–5.1)
Sodium: 137 mmol/L (ref 135–145)
Total Bilirubin: 0.7 mg/dL (ref 0.3–1.2)
Total Protein: 6.2 g/dL — ABNORMAL LOW (ref 6.5–8.1)

## 2018-06-24 LAB — CBC WITH DIFFERENTIAL/PLATELET
Abs Immature Granulocytes: 0 10*3/uL (ref 0.0–0.1)
Basophils Absolute: 0 10*3/uL (ref 0.0–0.1)
Basophils Relative: 0 %
Eosinophils Absolute: 0 10*3/uL (ref 0.0–0.7)
Eosinophils Relative: 0 %
HCT: 30.3 % — ABNORMAL LOW (ref 36.0–46.0)
Hemoglobin: 10.1 g/dL — ABNORMAL LOW (ref 12.0–15.0)
Immature Granulocytes: 0 %
Lymphocytes Relative: 20 %
Lymphs Abs: 1.2 10*3/uL (ref 0.7–4.0)
MCH: 29.9 pg (ref 26.0–34.0)
MCHC: 33.3 g/dL (ref 30.0–36.0)
MCV: 89.6 fL (ref 78.0–100.0)
Monocytes Absolute: 0.2 10*3/uL (ref 0.1–1.0)
Monocytes Relative: 3 %
Neutro Abs: 4.5 10*3/uL (ref 1.7–7.7)
Neutrophils Relative %: 77 %
Platelets: 266 10*3/uL (ref 150–400)
RBC: 3.38 MIL/uL — ABNORMAL LOW (ref 3.87–5.11)
RDW: 17 % — ABNORMAL HIGH (ref 11.5–15.5)
WBC: 5.9 10*3/uL (ref 4.0–10.5)

## 2018-06-24 LAB — ECHOCARDIOGRAM COMPLETE
Height: 64 in
Weight: 2867.74 oz

## 2018-06-24 LAB — HEMOGLOBIN A1C
Hgb A1c MFr Bld: 6 % — ABNORMAL HIGH (ref 4.8–5.6)
Mean Plasma Glucose: 125.5 mg/dL

## 2018-06-24 LAB — LIPID PANEL
Cholesterol: 189 mg/dL (ref 0–200)
HDL: 63 mg/dL (ref >40)
LDL Cholesterol: 113 mg/dL — ABNORMAL HIGH (ref 0–99)
Total CHOL/HDL Ratio: 3 RATIO
Triglycerides: 65 mg/dL (ref <150)
VLDL: 13 mg/dL (ref 0–40)

## 2018-06-24 MED ORDER — LOSARTAN POTASSIUM 50 MG PO TABS
50.0000 mg | ORAL_TABLET | Freq: Every day | ORAL | Status: DC
  Administered 2018-06-25 – 2018-06-26 (×2): 50 mg via ORAL
  Filled 2018-06-24 (×2): qty 1

## 2018-06-24 MED ORDER — FUROSEMIDE 40 MG PO TABS
40.0000 mg | ORAL_TABLET | Freq: Every day | ORAL | Status: DC
  Administered 2018-06-25 – 2018-06-27 (×3): 40 mg via ORAL
  Filled 2018-06-24 (×3): qty 1

## 2018-06-24 MED ORDER — STROKE: EARLY STAGES OF RECOVERY BOOK
Freq: Once | Status: AC
  Administered 2018-06-24: 13:00:00
  Filled 2018-06-24: qty 1

## 2018-06-24 NOTE — Progress Notes (Signed)
Carotid duplex prelim: 1-39% ICA stenosis.  Pastor Sgro Eunice, RDMS, RVT   

## 2018-06-24 NOTE — Progress Notes (Signed)
Patient alert and oriented, daughter at bed side for emotional support. Patient went for her MRI with transportation in bed. Saline lock on room air.

## 2018-06-24 NOTE — Code Documentation (Signed)
84 yo female inpatient code stroke.  LSW 0320.  Nursing staff called when pt was difficult to wake and was confused.  New Left arm weakness and slight dysarthria.  NIHSS 6.  CBG 162. No nacrotics or sedatives previously given.  CT done.  While in CT, Dr. Laurence Slate at bedside.  Symptoms resolved in transport to CT and were absent in CT.  TIA alert.  Contracted with nursing for VS and neuro checks q2 hrs x 12 hours, then q4 hrs.

## 2018-06-24 NOTE — Telephone Encounter (Signed)
FYI

## 2018-06-24 NOTE — Progress Notes (Addendum)
Nurse Tech called me to the pt's room because pt was week. Pt was week in all extremities, had slurred speech, slow and difficult to arouse. Pt had change in mental status. Donnamarie Poag, NP and Rapid Response was called.  See epic for VS. CBG=162. Pt was taken to CT STAT.

## 2018-06-24 NOTE — Consult Note (Signed)
Requesting Physician: Jimmye Norman NP    Chief Complaint: difficulty arousing, left side weakness  History obtained from:  NP, Rapid response  and Chart    HPI:                                                                                                                                       MCKENNA GAMM is an 84 y.o. female with past medical history of stroke, diabetes mellitus,hypertension, CHF with acute systolic heart failure with EF of 20 to 25% admitted for pancreatitis.  Stroke alert was called this morning after the nurse had difficulty waking up the patient and then noticed left-sided weakness.  Last seen normal was 3:20 AM when patient use bedside commode.  Around 5:45 AM nurse tried to wake the patient up however patient was not easy to arouse.  The nurse finally got her up and patient slumped over and was not moving both arms.  Rapid response was called-at this time patient was able to raise the right arm however unable to move left arm.  No facial droop was noted some slurred speech.  Code stroke was activated, however patient started to improve on the way down to CT scanner and was back to baseline on on my assessment.  Head CT showed no obvious acute abnormality.  Not a TPA candidate as symptoms have resolved.  Date last known well: 8.16.19 Time last known well: 3.20 am  tPA Given: no, symptoms resolved NIHSS:  0 Baseline MRS 0     Past Medical History:  Diagnosis Date  . Allergic rhinitis due to pollen 04/27/2007  . Arthritis    "in q joint" (08/07/2013)  . CHF (congestive heart failure) (HCC)   . Coronary atherosclerosis of native coronary artery   . Cough   . Disorder of bone and cartilage, unspecified   . Edema 05/03/2013  . Exertional shortness of breath    "sometimes" (08/07/2013)  . First degree atrioventricular block   . Gout, unspecified 04/19/2013  . Hyperlipidemia 04/27/2007  . Hypertension   . Muscle spasm   . Muscle weakness (generalized)    . Myocardial infarction (HCC) ~ 1970  . Onychia and paronychia of toe   . Osteoarthrosis, unspecified whether generalized or localized, unspecified site 04/27/2007  . Other fall   . Other malaise and fatigue   . Pneumonia 05/2018  . Prepatellar bursitis   . Seizures (HCC)   . Stroke (HCC) 01/17/2014  . TIA (transient ischemic attack)    "a few one summer" (08/07/2013)  . Type II diabetes mellitus (HCC)    "fasting 90-110s" (08/07/2013)  . Unspecified essential hypertension 08/08/2013  . Unspecified vitamin D deficiency     Past Surgical History:  Procedure Laterality Date  . ABDOMINAL HYSTERECTOMY  04/1980  . APPENDECTOMY  1960  . CARDIAC CATHETERIZATION  2003  . CATARACT EXTRACTION W/ INTRAOCULAR LENS  IMPLANT, BILATERAL Bilateral ~  2012  . JOINT REPLACEMENT    . REPLACEMENT TOTAL KNEE Right 09/2005  . RIGHT/LEFT HEART CATH AND CORONARY ANGIOGRAPHY N/A 06/01/2018   Procedure: RIGHT/LEFT HEART CATH AND CORONARY ANGIOGRAPHY;  Surgeon: Corky Crafts, MD;  Location: [facility] INVASIVE CV LAB;  Service: Cardiovascular;  Laterality: N/A;  . SHOULDER OPEN ROTATOR CUFF REPAIR Right 07/1999  . TONSILLECTOMY  08/1974  . TOTAL KNEE ARTHROPLASTY Left 08/07/2013   Procedure: TOTAL KNEE ARTHROPLASTY- LEFT;  Surgeon: Dannielle Huh, MD;  Location: [facility] OR;  Service: Orthopedics;  Laterality: Left;  . TRANSTHORACIC ECHOCARDIOGRAM  2003   EF 55-65%; mild concentric LVH    Family History  Problem Relation Age of Onset  . Heart attack Father    Social History:  reports that she has never smoked. She has never used smokeless tobacco. She reports that she does not drink alcohol or use drugs.  Allergies:  Allergies  Allergen Reactions  . Clonidine Derivatives Swelling    [name]  . Fish Allergy Diarrhea, Swelling and Other (See Comments)    Turns skin "black," but can tolerate white fish Salmon- Diarrhea  . Shellfish Allergy  Hives  . Doxycycline Rash  . Indomethacin Other (See Comments)    Reaction not recalled by the patient  . Lyrica [Pregabalin] Other (See Comments)    Hallucinations   . Methyldopa Other (See Comments)    Aldomet (for hypertension): Reaction not recalled by the patient  . Orange Fruit [Citrus] Other (See Comments)    Indigestion/heartburn  . Cetirizine Hcl Itching and Rash  . Codeine Itching  . Levaquin [Levofloxacin In D5w] Rash  . Tomato Rash    Medications:                                                                                                                        I reviewed home medications   ROS:                                                                                                                                     14 systems reviewed and negative except above   Examination:  General: Appears well-developed and well-nourished.  Psych: Affect appropriate to situation Eyes: No scleral injection HENT: No OP obstrucion Head: Normocephalic.  Cardiovascular: Normal rate and regular rhythm.  Respiratory: Effort normal and breath sounds normal to anterior ascultation GI: Soft.  No distension. There is no tenderness.  Skin: WDI    Neurological Examination Mental Status: Alert, oriented, thought content appropriate.  Speech fluent without evidence of aphasia. Able to follow 3 step commands without difficulty. Cranial Nerves: II: Visual fields grossly normal,  III,IV, VI: ptosis not present, extra-ocular motions intact bilaterally, pupils equal, round, reactive to light and accommodation V,VII: smile symmetric, facial light touch sensation normal bilaterally VIII: hearing normal bilaterally IX,X: uvula rises symmetrically XI: bilateral shoulder shrug XII: midline tongue extension Motor: Right : Upper extremity   5/5    Left:     Upper extremity    5/5  Lower extremity   5/5     Lower extremity   5/5 Tone and bulk:normal tone throughout; no atrophy noted Sensory: Pinprick and light touch intact throughout, bilaterally Plantars: Right: downgoing   Left: downgoing Cerebellar: normal finger-to-nose, normal rapid alternating movements and normal heel-to-shin test Gait: normal gait and station     Lab Results: Basic Metabolic Panel: Recent Labs  Lab 06/22/18 2153 06/23/18 0500  NA 135 139  K 4.2 3.7  CL 104 103  CO2 20* 27  GLUCOSE 132* 89  BUN 20 18  CREATININE 1.16* 1.18*  CALCIUM 9.0 9.1    CBC: Recent Labs  Lab 06/22/18 2153 06/23/18 0500 06/24/18 0524  WBC 7.7 8.3 5.9  NEUTROABS  --  5.1 4.5  HGB 10.5* 10.3* 10.1*  HCT 33.4* 31.8* 30.3*  MCV 92.8 90.6 89.6  PLT 299 306 266    Coagulation Studies: No results for input(s): LABPROT, INR in the last 72 hours.  Imaging: Dg Chest 2 View  Result Date: 06/22/2018 CLINICAL DATA:  Chest pain and abdominal pain EXAM: CHEST - 2 VIEW COMPARISON:  06/01/2018 FINDINGS: Cardiac shadow is again enlarged but stable. Mild vascular congestion is noted without significant edema. No focal infiltrate is seen. No acute bony abnormality is noted. Chronic changes about both shoulder joints are seen. IMPRESSION: Cardiomegaly and mild congestive changes. Electronically Signed   By: Alcide Clever [name]   On: 06/22/2018 22:26   US Abdomen Complete  Result Date: 06/23/2018 CLINICAL DATA:  Abdominal pain for 3 days. History of appendectomy, hysterectomy. EXAM: ABDOMEN ULTRASOUND COMPLETE COMPARISON:  Abdominal ultrasound November 30, 2016 and CT abdomen and pelvis September 11, 2015. FINDINGS: Gallbladder: Mild distended gallbladder with punctate echogenic layering gallbladder sludge and cholelithiasis measuring to 4 mm. No gallbladder wall thickening or pericholecystic fluid. No sonographic Murphy sign elicited. Common bile duct: Diameter: Dilated common bile duct measuring to 15 mm with  suspected sludge. Liver: No focal lesion identified. Mild intrahepatic biliary dilatation. Within normal limits in parenchymal echogenicity. Portal vein is patent on color Doppler imaging with normal direction of blood flow towards the liver. IVC: No abnormality visualized. Pancreas: Visualized portion unremarkable. Spleen: Size and appearance within normal limits. Right Kidney: Length: 10.5 cm. Mildly increased cortical echogenicity. No solid mass or hydronephrosis visualized. 5.2 cm homogeneously anechoic cyst upper pole RIGHT kidney. Left Kidney: Length: 11.4 cm. Mildly increased cortical echogenicity. No solid mass or hydronephrosis visualized. Abdominal aorta: No aneurysm visualized. Echogenic calcific atherosclerosis. Other findings: None. IMPRESSION: 1. Cholelithiasis and sludge without sonographic findings of acute cholecystitis. 2. Biliary dilatation with potential choledocholithiasis/sludge. MRCP may be more definitive.  3. Mildly echogenic kidneys seen with medical renal disease. Aortic Atherosclerosis (ICD10-I70.0). Electronically Signed   By: Awilda Metro [name]   On: 06/23/2018 00:37   Mr Abdomen Mrcp Wo Contrast  Result Date: 06/23/2018 CLINICAL DATA:  Epigastric pain. Elevated LFTs a gallstone. Concern for common bile duct stone. EXAM: MRI ABDOMEN WITHOUT CONTRAST  (INCLUDING MRCP) TECHNIQUE: Multiplanar multisequence MR imaging of the abdomen was performed. Heavily T2-weighted images of the biliary and pancreatic ducts were obtained, and three-dimensional MRCP images were rendered by post processing. COMPARISON:  Ultrasound 06/22/2018 FINDINGS: Lower chest:  Lung bases are clear. Hepatobiliary: No intrahepatic biliary duct dilatation. Gallbladder nondistended. Sludge in gallstones described on comparison ultrasound are difficult to define. No intrahepatic duct dilatation. The common hepatic duct measures 14 mm. Duct tapers to 9 mm through the common bile duct. Duct measures 9 mm at the  ampullary region. No evidence clear stone within the common bile duct. There is some dependent material within the common bile duct through the pancreatic head region (image 24/8). This may represent sludge. No obstructing lesion at the ampulla. Potential sludge within the distal common bile duct (image 25/8) layers dependently. Pancreas: Pancreatic duct is nondilated. No pancreatic inflammation. Spleen: Normal spleen. Adrenals/urinary tract: Adrenal glands normal. Benign-appearing cyst of the RIGHT kidney Stomach/Bowel: Stomach and limited of the small bowel is unremarkable Vascular/Lymphatic: Abdominal aortic normal caliber. No retroperitoneal periportal lymphadenopathy. Musculoskeletal: No aggressive osseous lesion IMPRESSION: 1. Mild dilatation of the common hepatic duct and common bile duct. No obstructing lesion is identified. Small amount sludge layers dependently within the common bile duct. 2. No gallstones evident. Gallstones and sludge identified on comparison ultrasound. 3. No intrahepatic duct dilatation. 4. No pancreatic inflammation Electronically Signed   By: Genevive Bi [name]   On: 06/23/2018 12:22   Mr 3d Recon At Scanner  Result Date: 06/23/2018 CLINICAL DATA:  Epigastric pain. Elevated LFTs a gallstone. Concern for common bile duct stone. EXAM: MRI ABDOMEN WITHOUT CONTRAST  (INCLUDING MRCP) TECHNIQUE: Multiplanar multisequence MR imaging of the abdomen was performed. Heavily T2-weighted images of the biliary and pancreatic ducts were obtained, and three-dimensional MRCP images were rendered by post processing. COMPARISON:  Ultrasound 06/22/2018 FINDINGS: Lower chest:  Lung bases are clear. Hepatobiliary: No intrahepatic biliary duct dilatation. Gallbladder nondistended. Sludge in gallstones described on comparison ultrasound are difficult to define. No intrahepatic duct dilatation. The common hepatic duct measures 14 mm. Duct tapers to 9 mm through the common bile duct. Duct measures 9 mm  at the ampullary region. No evidence clear stone within the common bile duct. There is some dependent material within the common bile duct through the pancreatic head region (image 24/8). This may represent sludge. No obstructing lesion at the ampulla. Potential sludge within the distal common bile duct (image 25/8) layers dependently. Pancreas: Pancreatic duct is nondilated. No pancreatic inflammation. Spleen: Normal spleen. Adrenals/urinary tract: Adrenal glands normal. Benign-appearing cyst of the RIGHT kidney Stomach/Bowel: Stomach and limited of the small bowel is unremarkable Vascular/Lymphatic: Abdominal aortic normal caliber. No retroperitoneal periportal lymphadenopathy. Musculoskeletal: No aggressive osseous lesion IMPRESSION: 1. Mild dilatation of the common hepatic duct and common bile duct. No obstructing lesion is identified. Small amount sludge layers dependently within the common bile duct. 2. No gallstones evident. Gallstones and sludge identified on comparison ultrasound. 3. No intrahepatic duct dilatation. 4. No pancreatic inflammation Electronically Signed   By: Genevive Bi [name]   On: 06/23/2018 12:22   Ct Head Code Stroke Wo Contrast  Result Date: 06/24/2018  CLINICAL DATA:  Code stroke. Initial evaluation for acute left arm weakness, slurred speech. EXAM: CT HEAD WITHOUT CONTRAST TECHNIQUE: Contiguous axial images were obtained from the base of the skull through the vertex without intravenous contrast. COMPARISON:  Prior CT from 05/29/2017 FINDINGS: Brain: Atrophy with moderate chronic microvascular ischemic disease. No acute intracranial hemorrhage. No acute large vessel territory infarct. No mass lesion, midline shift or mass effect. No hydrocephalus. No extra-axial fluid collection. Vascular: No hyperdense vessel. Scattered vascular calcifications noted within the carotid siphons. Skull: Scalp soft tissues and calvarium within normal limits. Sinuses/Orbits: Globes and orbital soft  tissues demonstrate no acute finding. Mild layering opacity within left sphenoid sinus. Small right mastoid effusion. Other: None. ASPECTS [facility] Stroke Program Early CT Score) - Ganglionic level infarction (caudate, lentiform nuclei, internal capsule, insula, M1-M3 cortex): 7 - Supraganglionic infarction (M4-M6 cortex): 3 Total score (0-10 with 10 being normal): 10 IMPRESSION: 1. No acute intracranial infarct or other abnormality. 2. ASPECTS is 10. 3. Atrophy with moderate chronic small vessel ischemic disease. 4. Mild acute left sphenoid sinusitis. These results were communicated to Dr. Laurence Slate At 6:34 amon 8/16/2019by text page via the [facility] messaging system. Electronically Signed   By: Rise Mu [name]   On: 06/24/2018 06:35     ASSESSMENT AND PLAN  84 y.o. female with past medical history of stroke, diabetes mellitus,hypertension, CHF with acute systolic heart failure with EF of 20 to 25% admitted for pancreatitis.  Stroke alert was called this morning after the nurse had difficulty waking up the patient and then noticed left-sided weakness. Her symptoms rapidly improved and is not a candidate for TPA.   Hypoperfusion vs TIA   Recommend MRI brain to rule out acute infarcts Carotid Dopplers Repeat TTE Aspirin, however likely being held as patient is scheduled to go to the OR today-for possible complete neuro work-up before surgery.  However, given significant improvement I do not suspect we will find something that would preclude him from having surgery surgery. Would avoid hypotension, monitor for atrial fibrillation Neurochecks/NIH stroke scale   Yasheka Fossett [facility] Pager Number [phone]

## 2018-06-24 NOTE — Progress Notes (Signed)
Patient is asking if she can eat. Speech therapist has not seen patient yet. Attending MD was paged about this matter.

## 2018-06-24 NOTE — Progress Notes (Signed)
Brief Neurology Note  Chart Reviewed. Pt admitted for abd pain. Gall stones/pancreatitis found and she was scheduled for surgery this am. RRT called a code stroke for AMS just before change of shift.  MRI reviewed, there is an incidental infarct seen around the PCA/MCA borderzone area. There are no recorded low Bps in chart. The pt was lying in bed sleeping, without any orthostatic component. This tiny infarct would not explain or correlate to the symptoms at 0545 this am. This would not cause lethargy or AMS.   Upon exam this afternoon, NIHSS 0. She is A&Ox4, speech clear, appropriate in conversation. She has very limited ROM d/t debilitating RA, specifically her arms/shoulders/hands. This limits her grip and drift exam. This is her baseline and not a focal deficit. Patient and her dtr at baseline state she feels like she is completely back to baseline.  We will procedure with stroke work up at this time prior to clearing her for surgery:  Added lipids/A1C and MRA brain to better evaluate intracranial vessels   Echo: mild LVH, EF 25%, Severe systolic dysfunction and LA dilation seen  -BNP elevated, known HF, CAD CUS: bilat ICAs 1-39%   I have updated pt and dtr at bedside, answered their many questions. We will follow up on the above added orders for stroke work up.

## 2018-06-24 NOTE — Progress Notes (Signed)
Progress Note  Patient Name: [name] [name] Date of Encounter: 06/24/2018  Primary Cardiologist: Chilton Si, MD   Subjective   Feeling well.  No chest pain or shortness of breath.  No abdominal pain.   Inpatient Medications    Scheduled Meds: .  stroke: mapping our early stages of recovery book   Does not apply Once  . allopurinol  100 mg Oral Daily  . carvedilol  25 mg Oral BID WC  . gabapentin  100 mg Oral QHS  . hydrALAZINE  100 mg Oral TID  . hydrocortisone sod succinate (SOLU-CORTEF) inj  50 mg Intravenous Q8H  . insulin aspart  0-9 Units Subcutaneous TID WC  . isosorbide mononitrate  30 mg Oral Daily   Continuous Infusions: . piperacillin-tazobactam (ZOSYN)  IV 3.375 g (06/24/18 0650)   PRN Meds: acetaminophen **OR** acetaminophen, albuterol, hydrALAZINE, ondansetron **OR** ondansetron (ZOFRAN) IV   Vital Signs    Vitals:   06/23/18 2022 06/23/18 2350 06/24/18 0537 06/24/18 0747  BP: 131/75 (!) 152/78 (!) 151/73 (!) 164/92  Pulse: 84 84 78 82  Resp: 18 16 18 18   Temp: 98.1 F (36.7 C) (!) 97.5 F (36.4 C) 97.6 F (36.4 C) (!) 97.3 F (36.3 C)  TempSrc: Oral Oral Oral Oral  SpO2: 100% 96% 100% 99%  Weight:   81.3 kg   Height:        Intake/Output Summary (Last 24 hours) at 06/24/2018 0841 Last data filed at 06/24/2018 0543 Gross per 24 hour  Intake 50 ml  Output 1850 ml  Net -1800 ml   Filed Weights   06/22/18 2153 06/23/18 0335 06/24/18 0537  Weight: 78.9 kg 78.9 kg 81.3 kg    Telemetry    Sinus rhythm.  PVCs.  - Personally Reviewed  ECG    n/a - Personally Reviewed  Physical Exam   VS:  BP (!) 164/92 (BP Location: Left Arm)   Pulse 82   Temp (!) 97.3 F (36.3 C) (Oral)   Resp 18   Ht 5\' 4"  (1.626 m)   Wt 81.3 kg   SpO2 99%   BMI 30.77 kg/m  , BMI Body mass index is 30.77 kg/m. GENERAL:  Chronically ill-appearing.  No acute distress HEENT: Pupils equal round and reactive, fundi not visualized, oral mucosa  unremarkable NECK:  No jugular venous distention, waveform within normal limits, carotid upstroke brisk and symmetric, no bruits LUNGS:  Clear to auscultation bilaterally.  No crackles, wheezes or rhonchi HEART:  RRR.  PMI not displaced or sustained,S1 and S2 within normal limits, no S3, no S4, no clicks, no rubs, no murmurs ABD:  Flat, positive bowel sounds normal in frequency in pitch, no bruits, no rebound, no guarding, no midline pulsatile mass, no hepatomegaly, no splenomegaly EXT:  2 plus pulses throughout, no edema, no cyanosis no clubbing SKIN:  No rashes no nodules NEURO:  Cranial nerves II through XII grossly intact, motor grossly intact throughout [facility]:  Cognitively intact, oriented to person place and time   Labs    Chemistry Recent Labs  Lab 06/22/18 2153 06/23/18 0500 06/24/18 0524  NA 135 139 137  K 4.2 3.7 3.5  CL 104 103 100  CO2 20* 27 27  GLUCOSE 132* 89 159*  BUN 20 18 17   CREATININE 1.16* 1.18* 1.21*  CALCIUM 9.0 9.1 8.9  PROT 6.8 6.6 6.2*  ALBUMIN 3.3* 3.2* 3.0*  AST 119* 120* 43*  ALT 203* 204* 140*  ALKPHOS 248* 249* 225*  BILITOT  2.0* 1.2 0.7  GFRNONAA 41* 41* 39*  GFRAA 48* 47* 46*  ANIONGAP 11 9 10      Hematology Recent Labs  Lab 06/22/18 2153 06/23/18 0500 06/24/18 0524  WBC 7.7 8.3 5.9  RBC 3.60* 3.51* 3.38*  HGB 10.5* 10.3* 10.1*  HCT 33.4* 31.8* 30.3*  MCV 92.8 90.6 89.6  MCH 29.2 29.3 29.9  MCHC 31.4 32.4 33.3  RDW 17.1* 17.2* 17.0*  PLT 299 306 266    Cardiac Enzymes Recent Labs  Lab 06/22/18 2153 06/23/18 0500 06/23/18 1015 06/23/18 1628  TROPONINI <0.03 0.03* 0.03* 0.03*   No results for input(s): TROPIPOC in the last 168 hours.   BNP Recent Labs  Lab 06/22/18 2153  BNP 1,146.7*     DDimer No results for input(s): DDIMER in the last 168 hours.   Radiology    Dg Chest 2 View  Result Date: 06/22/2018 CLINICAL DATA:  Chest pain and abdominal pain EXAM: CHEST - 2 VIEW COMPARISON:  06/01/2018 FINDINGS:  Cardiac shadow is again enlarged but stable. Mild vascular congestion is noted without significant edema. No focal infiltrate is seen. No acute bony abnormality is noted. Chronic changes about both shoulder joints are seen. IMPRESSION: Cardiomegaly and mild congestive changes. Electronically Signed   By: Alcide Clever [name]   On: 06/22/2018 22:26   US Abdomen Complete  Result Date: 06/23/2018 CLINICAL DATA:  Abdominal pain for 3 days. History of appendectomy, hysterectomy. EXAM: ABDOMEN ULTRASOUND COMPLETE COMPARISON:  Abdominal ultrasound November 30, 2016 and CT abdomen and pelvis September 11, 2015. FINDINGS: Gallbladder: Mild distended gallbladder with punctate echogenic layering gallbladder sludge and cholelithiasis measuring to 4 mm. No gallbladder wall thickening or pericholecystic fluid. No sonographic Murphy sign elicited. Common bile duct: Diameter: Dilated common bile duct measuring to 15 mm with suspected sludge. Liver: No focal lesion identified. Mild intrahepatic biliary dilatation. Within normal limits in parenchymal echogenicity. Portal vein is patent on color Doppler imaging with normal direction of blood flow towards the liver. IVC: No abnormality visualized. Pancreas: Visualized portion unremarkable. Spleen: Size and appearance within normal limits. Right Kidney: Length: 10.5 cm. Mildly increased cortical echogenicity. No solid mass or hydronephrosis visualized. 5.2 cm homogeneously anechoic cyst upper pole RIGHT kidney. Left Kidney: Length: 11.4 cm. Mildly increased cortical echogenicity. No solid mass or hydronephrosis visualized. Abdominal aorta: No aneurysm visualized. Echogenic calcific atherosclerosis. Other findings: None. IMPRESSION: 1. Cholelithiasis and sludge without sonographic findings of acute cholecystitis. 2. Biliary dilatation with potential choledocholithiasis/sludge. MRCP may be more definitive. 3. Mildly echogenic kidneys seen with medical renal disease. Aortic Atherosclerosis  (ICD10-I70.0). Electronically Signed   By: Awilda Metro [name]   On: 06/23/2018 00:37   Mr Abdomen Mrcp Wo Contrast  Result Date: 06/23/2018 CLINICAL DATA:  Epigastric pain. Elevated LFTs a gallstone. Concern for common bile duct stone. EXAM: MRI ABDOMEN WITHOUT CONTRAST  (INCLUDING MRCP) TECHNIQUE: Multiplanar multisequence MR imaging of the abdomen was performed. Heavily T2-weighted images of the biliary and pancreatic ducts were obtained, and three-dimensional MRCP images were rendered by post processing. COMPARISON:  Ultrasound 06/22/2018 FINDINGS: Lower chest:  Lung bases are clear. Hepatobiliary: No intrahepatic biliary duct dilatation. Gallbladder nondistended. Sludge in gallstones described on comparison ultrasound are difficult to define. No intrahepatic duct dilatation. The common hepatic duct measures 14 mm. Duct tapers to 9 mm through the common bile duct. Duct measures 9 mm at the ampullary region. No evidence clear stone within the common bile duct. There is some dependent material within the common bile duct through  the pancreatic head region (image 24/8). This may represent sludge. No obstructing lesion at the ampulla. Potential sludge within the distal common bile duct (image 25/8) layers dependently. Pancreas: Pancreatic duct is nondilated. No pancreatic inflammation. Spleen: Normal spleen. Adrenals/urinary tract: Adrenal glands normal. Benign-appearing cyst of the RIGHT kidney Stomach/Bowel: Stomach and limited of the small bowel is unremarkable Vascular/Lymphatic: Abdominal aortic normal caliber. No retroperitoneal periportal lymphadenopathy. Musculoskeletal: No aggressive osseous lesion IMPRESSION: 1. Mild dilatation of the common hepatic duct and common bile duct. No obstructing lesion is identified. Small amount sludge layers dependently within the common bile duct. 2. No gallstones evident. Gallstones and sludge identified on comparison ultrasound. 3. No intrahepatic duct dilatation.  4. No pancreatic inflammation Electronically Signed   By: Genevive Bi [name]   On: 06/23/2018 12:22   Mr 3d Recon At Scanner  Result Date: 06/23/2018 CLINICAL DATA:  Epigastric pain. Elevated LFTs a gallstone. Concern for common bile duct stone. EXAM: MRI ABDOMEN WITHOUT CONTRAST  (INCLUDING MRCP) TECHNIQUE: Multiplanar multisequence MR imaging of the abdomen was performed. Heavily T2-weighted images of the biliary and pancreatic ducts were obtained, and three-dimensional MRCP images were rendered by post processing. COMPARISON:  Ultrasound 06/22/2018 FINDINGS: Lower chest:  Lung bases are clear. Hepatobiliary: No intrahepatic biliary duct dilatation. Gallbladder nondistended. Sludge in gallstones described on comparison ultrasound are difficult to define. No intrahepatic duct dilatation. The common hepatic duct measures 14 mm. Duct tapers to 9 mm through the common bile duct. Duct measures 9 mm at the ampullary region. No evidence clear stone within the common bile duct. There is some dependent material within the common bile duct through the pancreatic head region (image 24/8). This may represent sludge. No obstructing lesion at the ampulla. Potential sludge within the distal common bile duct (image 25/8) layers dependently. Pancreas: Pancreatic duct is nondilated. No pancreatic inflammation. Spleen: Normal spleen. Adrenals/urinary tract: Adrenal glands normal. Benign-appearing cyst of the RIGHT kidney Stomach/Bowel: Stomach and limited of the small bowel is unremarkable Vascular/Lymphatic: Abdominal aortic normal caliber. No retroperitoneal periportal lymphadenopathy. Musculoskeletal: No aggressive osseous lesion IMPRESSION: 1. Mild dilatation of the common hepatic duct and common bile duct. No obstructing lesion is identified. Small amount sludge layers dependently within the common bile duct. 2. No gallstones evident. Gallstones and sludge identified on comparison ultrasound. 3. No intrahepatic duct  dilatation. 4. No pancreatic inflammation Electronically Signed   By: Genevive Bi [name]   On: 06/23/2018 12:22   Ct Head Code Stroke Wo Contrast  Result Date: 06/24/2018 CLINICAL DATA:  Code stroke. Initial evaluation for acute left arm weakness, slurred speech. EXAM: CT HEAD WITHOUT CONTRAST TECHNIQUE: Contiguous axial images were obtained from the base of the skull through the vertex without intravenous contrast. COMPARISON:  Prior CT from 05/29/2017 FINDINGS: Brain: Atrophy with moderate chronic microvascular ischemic disease. No acute intracranial hemorrhage. No acute large vessel territory infarct. No mass lesion, midline shift or mass effect. No hydrocephalus. No extra-axial fluid collection. Vascular: No hyperdense vessel. Scattered vascular calcifications noted within the carotid siphons. Skull: Scalp soft tissues and calvarium within normal limits. Sinuses/Orbits: Globes and orbital soft tissues demonstrate no acute finding. Mild layering opacity within left sphenoid sinus. Small right mastoid effusion. Other: None. ASPECTS [facility] Stroke Program Early CT Score) - Ganglionic level infarction (caudate, lentiform nuclei, internal capsule, insula, M1-M3 cortex): 7 - Supraganglionic infarction (M4-M6 cortex): 3 Total score (0-10 with 10 being normal): 10 IMPRESSION: 1. No acute intracranial infarct or other abnormality. 2. ASPECTS is 10. 3. Atrophy with  moderate chronic small vessel ischemic disease. 4. Mild acute left sphenoid sinusitis. These results were communicated to Dr. Laurence Slate At 6:34 amon 8/16/2019by text page via the [facility] messaging system. Electronically Signed   By: Rise Mu [name]   On: 06/24/2018 06:35    Cardiac Studies   Echo 05/29/18: Study Conclusions  - Left ventricle: The cavity size was normal. Wall thickness was   increased in a pattern of moderate LVH. Systolic function was   severely reduced. The estimated ejection fraction was in the   range of 20% to 25%.  Although no diagnostic regional wall motion   abnormality was identified, this possibility cannot be completely   excluded on the basis of this study. - Aortic valve: Mildly calcified annulus. Trileaflet; normal   thickness, moderately calcified leaflets. There was mild   regurgitation. - Mitral valve: Mildly calcified annulus. Mildly calcified leaflets   . There was mild to moderate regurgitation. - Left atrium: The atrium was moderately to severely dilated. - Right ventricle: The cavity size was mildly dilated. Wall   thickness was normal. Systolic function was mildly reduced. - Right atrium: The atrium was moderately dilated. - Atrial septum: There was a small patent foramen ovale. There was   a left-to-right shunt. - Tricuspid valve: There was moderate regurgitation. - Pulmonary arteries: Systolic pressure was moderately increased.   PA peak pressure: 47 mm Hg (S). - Pericardium, extracardiac: A trivial pericardial effusion was   identified.  LHC/RHC 06/01/18:  LV end diastolic pressure is moderately elevated.  There is no aortic valve stenosis.  Hemodynamic findings consistent with moderate pulmonary hypertension.  CO 5.68 L/min; CI 2.97; Ao sat 99%; PA sat 65%; mean PA 42 mm Hg; mean PCWP 28 mm Hg  Nonobstructive CAD.   Nonischemic cardiomyopathy with mild to moderate CAD.   If there are no bleeding contraindications: Recommend Aspirin 81mg  daily for moderate CAD.   Patient Profile     Ms. Crotty is an 73F with non-obstructive CAD, hypertension, rheumatoid arthritis on methotrexate, prednisone, and leflunomide, moderate pulmonary hypertension, chronic systolic and diastolic heart failure, diabetes, PAD, and prior stroke here with gallstone pancreatitis.  Assessment & Plan    # Gallstone pancreatitis: Clinically stable.  Surgery on hold pending neuro work up.  # Acute on chronic systolic and diastolic heart failure: # Moderate pulmonary hypertension: # Essential  hypertension:  LVEF 20-25%.  She was asymptomatic but mildly volume overloaded on admission.  She is net -1.8L after IV lasix.  She appears euvolemic today.  We will resume her home losartan at reduced dose and add lasix 40mg  po daily.  Continue carvedilol  # Possible TIA: # Prior CVA: Work up per Neuro.  Symptoms have resolved and head CT was negative for acute changes.           For questions or updates, please contact [facility] Please consult [email or web address] for contact info under Cardiology/STEMI.      Signed, Chilton Si, MD  06/24/2018, 8:41 AM

## 2018-06-24 NOTE — Progress Notes (Addendum)
Pt last seen normal at 0320 when she got up and sat on the bedside commode. At that time, she was talking and walking normally. When the NT came in to get her am vital signs, pt was partially unresponsive. RN went into assess. NP called, went to bedside. S: unable to participate in ROS due to mental status.  O: Chronically ill appearing elderly female in NAD. VSS. Will open eyes for just a few seconds to voice and tactile stimulation, but goes right back to sleep. Answers some questions, but garbled speech. PERRL. Lifts right arm without pronator drift. Will not lift left arm. Moved feet on command, but it hard to keep awake to do a complete exam. RRR. S1S2. Normal respiratory effort.  A/P: change in mental status, last seen normal at 0320. Has not had any sedative meds. Later, she woke up and was able to tell NP the day, month, year, place and her name. She was moving all fours on the way to stat CT. We called a code stroke and neuro met [place] in [place]. See his note for further details and plan.  KJKG, NP [facility] CT neg. Neuro may want to get MRI.  Total critical care time: 45 minutes Critical care time was exclusive of separately billable procedures and treating other patients. Critical care was necessary to treat or prevent imminent or life-threatening deterioration. Critical care was time spent personally by me on the following activities: development of treatment plan with patient and/or surrogate as well as nursing, discussions with consultants, evaluation of patient's response to treatment, examination of patient, obtaining history from patient or surrogate, ordering and performing treatments and interventions, ordering and review of laboratory studies, ordering and review of radiographic studies, pulse oximetry and re-evaluation of patient's condition.

## 2018-06-24 NOTE — Evaluation (Signed)
Physical Therapy Evaluation Patient Details Name: ANJANAE WOEHRLE MRN: [id number] DOB: [birth year] Today's Date: 06/24/2018   History of Present Illness  PEARLEE ARVIZU is a 84 y.o. female with history of chronic systolic heart failure last EF measured last month was 20 to 25%, hypertension, diabetes mellitus, rheumatoid arthritis who was recently admitted for acute CHF and discharged to rehab and had gone back to home last week started experiencing nausea with abdominal pain last 3 days.  Unable to eat usual food because of the nausea and epigastric pain.  Epigastric pain radiates to the back and has been episodic and at times constant but denies any chest pain or shortness of breath.  Denies any diarrhea.    Clinical Impression  Pt admitted with above diagnosis. Pt currently with functional limitations due to the deficits listed below (see PT Problem List). PTA pt living at home with caregivers and family staying overnight, limited household ambulation with RW. Today pt presents weaker than baseline, ambulating short distance with min A for stability. Family present voices concern for patient living alone, feel they can not sustain 24/7. Given lack of 24/7 recommending short term SNF while patients look into assisted living per convo.  Pt will benefit from skilled PT to increase their independence and safety with mobility to allow discharge to the venue listed below.       Follow Up Recommendations SNF;Supervision/Assistance - 24 hour    Equipment Recommendations  None recommended by PT    Recommendations for Other Services       Precautions / Restrictions Precautions Precautions: Fall Restrictions Weight Bearing Restrictions: No      Mobility  Bed Mobility Overal bed mobility: Needs Assistance Bed Mobility: Supine to Sit;Sit to Supine     Supine to sit: Min assist;HOB elevated Sit to supine: Min assist;HOB elevated   General bed mobility comments: OOB at  entry  Transfers Overall transfer level: Needs assistance Equipment used: Rolling walker (2 wheeled) Transfers: Sit to/from Stand Sit to Stand: Min guard         General transfer comment: Assist to rise, stabilize, control descent. VCS safety, technique, hand placement  Ambulation/Gait Ambulation/Gait assistance: Min guard Gait Distance (Feet): 50 Feet Assistive device: Rolling walker (2 wheeled) Gait Pattern/deviations: Step-through pattern;Decreased stride length Gait velocity: decrease   General Gait Details: pt with unsteady gait, hands on assist to provide stability min A, VSS, decrease activty tolerence  Stairs            Wheelchair Mobility    Modified Rankin (Stroke Patients Only)       Balance Overall balance assessment: Needs assistance Sitting-balance support: Single extremity supported Sitting balance-Leahy Scale: Good     Standing balance support: Bilateral upper extremity supported Standing balance-Leahy Scale: Poor                               Pertinent Vitals/Pain Pain Assessment: No/denies pain    Home Living Family/patient expects to be discharged to:: Private residence Living Arrangements: Alone Available Help at Discharge: Personal care attendant;Family;Available PRN/intermittently Type of Home: Independent living facility Home Access: Level entry     Home Layout: One level Home Equipment: Hand held shower head;Walker - 4 wheels;Walker - 2 wheels;Cane - single point;Tub bench;Wheelchair - manual;Grab bars - toilet Additional Comments: Aide 7 days/week in the AM    Prior Function Level of Independence: Independent with assistive device(s)  Comments: Amb with rollator, Aide assists with bath, getting dressed, and laundry     Hand Dominance   Dominant Hand: Right    Extremity/Trunk Assessment   Upper Extremity Assessment Upper Extremity Assessment: Generalized weakness    Lower Extremity  Assessment Lower Extremity Assessment: Generalized weakness    Cervical / Trunk Assessment Cervical / Trunk Assessment: Kyphotic  Communication   Communication: No difficulties  Cognition Arousal/Alertness: Awake/alert Behavior During Therapy: WFL for tasks assessed/performed Overall Cognitive Status: Within Functional Limits for tasks assessed                                        General Comments      Exercises     Assessment/Plan    PT Assessment Patient needs continued PT services  PT Problem List Decreased mobility;Decreased strength;Decreased activity tolerance;Decreased balance;Decreased knowledge of use of DME       PT Treatment Interventions DME instruction;Gait training;Therapeutic activities;Therapeutic exercise;Patient/family education;Functional mobility training    PT Goals (Current goals can be found in the Care Plan section)  Acute Rehab PT Goals Patient Stated Goal: to walk further PT Goal Formulation: With patient Time For Goal Achievement: 07/08/18 Potential to Achieve Goals: Good    Frequency Min 3X/week   Barriers to discharge        Co-evaluation               AM-PAC PT "6 Clicks" Daily Activity  Outcome Measure Difficulty turning over in bed (including adjusting bedclothes, sheets and blankets)?: A Little Difficulty moving from lying on back to sitting on the side of the bed? : A Little Difficulty sitting down on and standing up from a chair with arms (e.g., wheelchair, bedside commode, etc,.)?: A Little Help needed moving to and from a bed to chair (including a wheelchair)?: A Little Help needed walking in hospital room?: A Little Help needed climbing 3-5 steps with a railing? : A Little 6 Click Score: 18    End of Session Equipment Utilized During Treatment: Gait belt Activity Tolerance: Patient limited by fatigue Patient left: with call bell/phone within reach;in chair Nurse Communication: Mobility status PT  Visit Diagnosis: Muscle weakness (generalized) (M62.81);Difficulty in walking, not elsewhere classified (R26.2)    Time: 1640-1700 PT Time Calculation (min) (ACUTE ONLY): 20 min   Charges:   PT Evaluation $PT Eval Low Complexity: 1 Low PT Treatments $Gait Training: 8-22 mins        Etta Grandchild, PT, DPT Acute Rehab Services Pager: [phone]    Etta Grandchild 06/24/2018, 5:18 PM

## 2018-06-24 NOTE — Progress Notes (Signed)
[facility]/Trauma Progress Note      Assessment/Plan Active Problems:   Rheumatoid arthritis involving multiple sites ([place])   Anemia of chronic disease   CKD stage 3 due to type 2 diabetes mellitus (HCC)   DM (diabetes mellitus), type 2 with renal complications (HCC)   DCM (dilated cardiomyopathy) (HCC)   Uncontrolled hypertension   Abdominal pain   Cholelithiasis   Elevated LFTs  Gallstone pancreatitis - Tbili now WNL, LFT's and alk phos still elevated - sludge seen on MRCP in [place] - cards stated increased risk, risk elevated but not prohibitive   Code Stroke called overnight, MRI pending  FEN: CLD, NPO at midnight VTE: SCD's ID: Zosyn Foley: none Follow up: TBD  DISPO: Possible OR tomorrow pending code stroke workup     LOS: 1 day    Subjective: CC: gallstone panc  No abdominal pain. Pt states she did not have any vomiting even though its been charted she did. Code stroke called overnight. Pt states she is feeling okay. She is thirsty. Daughter at bedside. Discussed possible surgery tomorrow pending stroke workup.   Objective: Vital signs in last 24 hours: Temp:  [97.3 F (36.3 C)-98.1 F (36.7 C)] 97.3 F (36.3 C) (08/16 0747) Pulse Rate:  [78-87] 82 (08/16 0747) Resp:  [16-18] 18 (08/16 0747) BP: (112-174)/(71-104) 164/92 (08/16 0747) SpO2:  [96 %-100 %] 99 % (08/16 0747) Weight:  [81.3 kg] 81.3 kg (08/16 0537) Last BM Date: 06/23/18  Intake/Output from previous day: 08/15 0701 - 08/16 0700 In: 50 [IV Piggyback:50] Out: 1850 [Urine:1850] Intake/Output this shift: No intake/output data recorded.  PE: Gen:  Alert, NAD, pleasant, cooperative Pulm:  Rate and effort normal Abd: Soft, NT/ND, +BS, no HSM Skin: no rashes noted, warm and dry   Anti-infectives: Anti-infectives (From admission, onward)   Start     Dose/Rate Route Frequency Ordered Stop   06/23/18 1000  piperacillin-tazobactam (ZOSYN) IVPB 3.375 g     3.375 g 12.5 mL/hr  over 240 Minutes Intravenous Every 8 hours 06/23/18 0456     06/23/18 0145  piperacillin-tazobactam (ZOSYN) IVPB 3.375 g     3.375 g 100 mL/hr over 30 Minutes Intravenous  Once 06/23/18 0137 06/23/18 0220      Lab Results:  Recent Labs    06/23/18 0500 06/24/18 0524  WBC 8.3 5.9  HGB 10.3* 10.1*  HCT 31.8* 30.3*  PLT 306 266   BMET Recent Labs    06/23/18 0500 06/24/18 0524  NA 139 137  K 3.7 3.5  CL 103 100  CO2 27 27  GLUCOSE 89 159*  BUN 18 17  CREATININE 1.18* 1.21*  CALCIUM 9.1 8.9   PT/INR No results for input(s): LABPROT, INR in the last 72 hours. CMP     Component Value Date/Time   NA 137 06/24/2018 0524   NA 138 06/15/2018 1122   K 3.5 06/24/2018 0524   CL 100 06/24/2018 0524   CO2 27 06/24/2018 0524   GLUCOSE 159 (H) 06/24/2018 0524   BUN 17 06/24/2018 0524   BUN 32 (H) 06/15/2018 1122   CREATININE 1.21 (H) 06/24/2018 0524   CREATININE 1.19 (H) 07/21/2017 1454   CALCIUM 8.9 06/24/2018 0524   PROT 6.2 (L) 06/24/2018 0524   PROT 7.2 09/09/2015 1210   ALBUMIN 3.0 (L) 06/24/2018 0524   ALBUMIN 3.9 09/09/2015 1210   AST 43 (H) 06/24/2018 0524   ALT 140 (H) 06/24/2018 0524   ALKPHOS 225 (H) 06/24/2018 0524   BILITOT 0.7  06/24/2018 0524   BILITOT 0.5 09/09/2015 1210   GFRNONAA 39 (L) 06/24/2018 0524   GFRNONAA 42 (L) 07/21/2017 1454   GFRAA 46 (L) 06/24/2018 0524   GFRAA 48 (L) 07/21/2017 1454   Lipase     Component Value Date/Time   LIPASE 77 (H) 06/23/2018 0500    Studies/Results: Dg Chest 2 View  Result Date: 06/22/2018 CLINICAL DATA:  Chest pain and abdominal pain EXAM: CHEST - 2 VIEW COMPARISON:  06/01/2018 FINDINGS: Cardiac shadow is again enlarged but stable. Mild vascular congestion is noted without significant edema. No focal infiltrate is seen. No acute bony abnormality is noted. Chronic changes about both shoulder joints are seen. IMPRESSION: Cardiomegaly and mild congestive changes. Electronically Signed   By: Inez Catalina [name]    On: 06/22/2018 22:26   US Abdomen Complete  Result Date: 06/23/2018 CLINICAL DATA:  Abdominal pain for 3 days. History of appendectomy, hysterectomy. EXAM: ABDOMEN ULTRASOUND COMPLETE COMPARISON:  Abdominal ultrasound November 30, 2016 and CT abdomen and pelvis September 11, 2015. FINDINGS: Gallbladder: Mild distended gallbladder with punctate echogenic layering gallbladder sludge and cholelithiasis measuring to 4 mm. No gallbladder wall thickening or pericholecystic fluid. No sonographic Murphy sign elicited. Common bile duct: Diameter: Dilated common bile duct measuring to 15 mm with suspected sludge. Liver: No focal lesion identified. Mild intrahepatic biliary dilatation. Within normal limits in parenchymal echogenicity. Portal vein is patent on color Doppler imaging with normal direction of blood flow towards the liver. IVC: No abnormality visualized. Pancreas: Visualized portion unremarkable. Spleen: Size and appearance within normal limits. Right Kidney: Length: 10.5 cm. Mildly increased cortical echogenicity. No solid mass or hydronephrosis visualized. 5.2 cm homogeneously anechoic cyst upper pole RIGHT kidney. Left Kidney: Length: 11.4 cm. Mildly increased cortical echogenicity. No solid mass or hydronephrosis visualized. Abdominal aorta: No aneurysm visualized. Echogenic calcific atherosclerosis. Other findings: None. IMPRESSION: 1. Cholelithiasis and sludge without sonographic findings of acute cholecystitis. 2. Biliary dilatation with potential choledocholithiasis/sludge. MRCP may be more definitive. 3. Mildly echogenic kidneys seen with medical renal disease. Aortic Atherosclerosis (ICD10-I70.0). Electronically Signed   By: Elon Alas [name]   On: 06/23/2018 00:37   Mr Abdomen Mrcp Wo Contrast  Result Date: 06/23/2018 CLINICAL DATA:  Epigastric pain. Elevated LFTs a gallstone. Concern for common bile duct stone. EXAM: MRI ABDOMEN WITHOUT CONTRAST  (INCLUDING MRCP) TECHNIQUE: Multiplanar  multisequence MR imaging of the abdomen was performed. Heavily T2-weighted images of the biliary and pancreatic ducts were obtained, and three-dimensional MRCP images were rendered by post processing. COMPARISON:  Ultrasound 06/22/2018 FINDINGS: Lower chest:  Lung bases are clear. Hepatobiliary: No intrahepatic biliary duct dilatation. Gallbladder nondistended. Sludge in gallstones described on comparison ultrasound are difficult to define. No intrahepatic duct dilatation. The common hepatic duct measures 14 mm. Duct tapers to 9 mm through the common bile duct. Duct measures 9 mm at the ampullary region. No evidence clear stone within the common bile duct. There is some dependent material within the common bile duct through the pancreatic head region (image 24/8). This may represent sludge. No obstructing lesion at the ampulla. Potential sludge within the distal common bile duct (image 25/8) layers dependently. Pancreas: Pancreatic duct is nondilated. No pancreatic inflammation. Spleen: Normal spleen. Adrenals/urinary tract: Adrenal glands normal. Benign-appearing cyst of the RIGHT kidney Stomach/Bowel: Stomach and limited of the small bowel is unremarkable Vascular/Lymphatic: Abdominal aortic normal caliber. No retroperitoneal periportal lymphadenopathy. Musculoskeletal: No aggressive osseous lesion IMPRESSION: 1. Mild dilatation of the common hepatic duct and common bile duct. No obstructing  lesion is identified. Small amount sludge layers dependently within the common bile duct. 2. No gallstones evident. Gallstones and sludge identified on comparison ultrasound. 3. No intrahepatic duct dilatation. 4. No pancreatic inflammation Electronically Signed   By: Suzy Bouchard [name]   On: 06/23/2018 12:22   Mr 3d Recon At Scanner  Result Date: 06/23/2018 CLINICAL DATA:  Epigastric pain. Elevated LFTs a gallstone. Concern for common bile duct stone. EXAM: MRI ABDOMEN WITHOUT CONTRAST  (INCLUDING MRCP) TECHNIQUE:  Multiplanar multisequence MR imaging of the abdomen was performed. Heavily T2-weighted images of the biliary and pancreatic ducts were obtained, and three-dimensional MRCP images were rendered by post processing. COMPARISON:  Ultrasound 06/22/2018 FINDINGS: Lower chest:  Lung bases are clear. Hepatobiliary: No intrahepatic biliary duct dilatation. Gallbladder nondistended. Sludge in gallstones described on comparison ultrasound are difficult to define. No intrahepatic duct dilatation. The common hepatic duct measures 14 mm. Duct tapers to 9 mm through the common bile duct. Duct measures 9 mm at the ampullary region. No evidence clear stone within the common bile duct. There is some dependent material within the common bile duct through the pancreatic head region (image 24/8). This may represent sludge. No obstructing lesion at the ampulla. Potential sludge within the distal common bile duct (image 25/8) layers dependently. Pancreas: Pancreatic duct is nondilated. No pancreatic inflammation. Spleen: Normal spleen. Adrenals/urinary tract: Adrenal glands normal. Benign-appearing cyst of the RIGHT kidney Stomach/Bowel: Stomach and limited of the small bowel is unremarkable Vascular/Lymphatic: Abdominal aortic normal caliber. No retroperitoneal periportal lymphadenopathy. Musculoskeletal: No aggressive osseous lesion IMPRESSION: 1. Mild dilatation of the common hepatic duct and common bile duct. No obstructing lesion is identified. Small amount sludge layers dependently within the common bile duct. 2. No gallstones evident. Gallstones and sludge identified on comparison ultrasound. 3. No intrahepatic duct dilatation. 4. No pancreatic inflammation Electronically Signed   By: Suzy Bouchard [name]   On: 06/23/2018 12:22   Ct Head Code Stroke Wo Contrast  Result Date: 06/24/2018 CLINICAL DATA:  Code stroke. Initial evaluation for acute left arm weakness, slurred speech. EXAM: CT HEAD WITHOUT CONTRAST TECHNIQUE:  Contiguous axial images were obtained from the base of the skull through the vertex without intravenous contrast. COMPARISON:  Prior CT from 05/29/2017 FINDINGS: Brain: Atrophy with moderate chronic microvascular ischemic disease. No acute intracranial hemorrhage. No acute large vessel territory infarct. No mass lesion, midline shift or mass effect. No hydrocephalus. No extra-axial fluid collection. Vascular: No hyperdense vessel. Scattered vascular calcifications noted within the carotid siphons. Skull: Scalp soft tissues and calvarium within normal limits. Sinuses/Orbits: Globes and orbital soft tissues demonstrate no acute finding. Mild layering opacity within left sphenoid sinus. Small right mastoid effusion. Other: None. ASPECTS [facility] Stroke Program Early CT Score) - Ganglionic level infarction (caudate, lentiform nuclei, internal capsule, insula, M1-M3 cortex): 7 - Supraganglionic infarction (M4-M6 cortex): 3 Total score (0-10 with 10 being normal): 10 IMPRESSION: 1. No acute intracranial infarct or other abnormality. 2. ASPECTS is 10. 3. Atrophy with moderate chronic small vessel ischemic disease. 4. Mild acute left sphenoid sinusitis. These results were communicated to Dr. Lorraine Lax At 6:34 amon 8/16/2019by text page via the [facility] messaging system. Electronically Signed   By: Jeannine Boga [name]   On: 06/24/2018 06:35      Kalman Drape , [facility] Surgery 06/24/2018, 8:08 AM  Pager: [phone] Mon-Wed, Friday 7:00am-4:30pm Thurs 7am-11:30am  Consults: [phone]

## 2018-06-24 NOTE — Progress Notes (Signed)
  Echocardiogram 2D Echocardiogram has been performed.  Leta Jungling M 06/24/2018, 9:44 AM

## 2018-06-24 NOTE — Progress Notes (Signed)
[facility] PROGRESS NOTE  [name] [name] GMW:[id number] DOB: 1930/07/03 DOA: 06/22/2018 PCP: Briscoe Deutscher, DO   Narrative:  84 year old female CKD stage III 2/2 DM TY 2 Dilated cardiomyopathy EF 20-30%-recent increase in Lasix dose HTN Rheumatoid arthritis on prednisone previously on Humira Xeljanz Bactrim Orencia--seen by Dr. Eber Hong 1 week prior to this admit no changes to medications other than resumption of methotrexate Chronic lower extremity wounds with PAD in the past by vascular surgery ABIs 11/2017 Prior CVA  Last admitted 05/2018 with new onset heart failure cath at that time showed moderately elevated LVEDP and moderate pulmonary hypertension CO 5.68/CI 2.9-- A small PFO was noted with left-to-right shunt.  Return to hospital 8/14 with abdominal pain, nausea, vomiting  alk phos 240 lipase 150 AST/ALT 119/203 direct bili 2.0 BNP 1146 troponin 0 0.03-MRCP confirm any other findings   A & Plan ?  TIA-stroke-CT head performed emergently 8/16 a.m. negative for stroke neurology consult appreciated-going for MRI will need vascular Dopplers-symptoms have rapidly improved-etiology given age and recent dilated cardiomyopathy might be 2/2 underlying A. fib-may need echocardiogram and long-term monitor and this would delay surgery which is reasonable in terms of safety for the patient  Cholecystitis-await further work-up with regards to stroke prior to decision re-surgery-continue Zosyn for now-diet as per surgeon-appreciate consult-would consider perioperative aspirin as relatively low risk procedure  Rheumatoid arthritis-taking methotrexate on chronic steroids and has been on stress dose Solu-Cortef every 8 hourly need this dose perioperatively Recently seen 8/2 by her rheumatologist felt to have more osteoarthritis-leflunomide was discontinued-options seem to involve potential injectable methotrexate  Dilated cardiomyopathy/nonischemic per cath last month-continue Coreg 25  twice daily, hydralazine 100 3 times daily, Imdur 30 daily--- would hold until we have the results of MRI this morning's meds Losartan 100 daily on hold, Aldactone 25 (half tablet) on hold  Diabetes mellitus-Metformin 500 daily on hold continue SSI sugars reasonable 1 60-200 range-continue gabapentin  Probable anemia chronic disease continue ferrous sulfate twice daily  Pulmonary hypertension-as above  Gout continue allopurinol 100 daily (not 200)  Probable carpal tunnel       DVT prophylaxis: Lovenox code Status: Full code family Communication: Daughter disposition Plan: Inpatient if and probably had a stroke-not going to be ready for discharge over the weekend at least and might need skilled care   Verlon Au, MD  [facility] Direct contact: [phone] --Via amion app OR  --[email or web address]; password [email or web address]  7PM-7AM contact night coverage as above 06/24/2018, 7:59 AM  LOS: 1 day   Consultants:  Cardiology  General surgery  Neurology  Procedures:  CT head  MRCP abdomen  Ultrasound abdomen  Antimicrobials:  Zosyn  Interval history/Subjective: Events noted over last night with probable TIA like symptoms around 545 when she was previously normal-symptoms rapidly improved on transport to CT Currently feels well has resolution of symptoms No chest pain No fever No chills Does not have any focal deficit in power is intact  Objective:  Vitals:  Vitals:   06/24/18 0537 06/24/18 0747  BP: (!) 151/73 (!) 164/92  Pulse: 78 82  Resp: 18 18  Temp: 97.6 F (36.4 C) (!) 97.3 F (36.3 C)  SpO2: 100% 99%    Exam:  Smile symmetric EOMI NCAT no pallor no icterus no bruit S1-S2 regular rate rhythm-telemetry shows some runs of PVCs Chest is clinically clear no added sound Abdomen is soft nontender no guarding no rebound no right upper quadrant tenderness or organomegaly No lower extremity edema able to move all 4  extremities with 5/5 power in the ankles upper  extremities biceps triceps reflexes deferred uvula is midline shoulder shrug is normal  I have personally reviewed the following:   Labs:  BUN/creatinine 17/1.2 alk phos 225 AST/ALT down from 120/204-to  43 and 140  Hemoglobin 10.1  Imaging studies:  CT scan  Medical tests:  No  Test discussed with performing physician:  No  Decision to obtain old records:  Y  Review and summation of old records:  Yes--reviewed [facility] from [facility] as well  Scheduled Meds: . allopurinol  100 mg Oral Daily  . carvedilol  25 mg Oral BID WC  . gabapentin  100 mg Oral QHS  . hydrALAZINE  100 mg Oral TID  . hydrocortisone sod succinate (SOLU-CORTEF) inj  50 mg Intravenous Q8H  . insulin aspart  0-9 Units Subcutaneous TID WC  . isosorbide mononitrate  30 mg Oral Daily   Continuous Infusions: . piperacillin-tazobactam (ZOSYN)  IV 3.375 g (06/24/18 0650)    Active Problems:   Rheumatoid arthritis involving multiple sites ([place])   Anemia of chronic disease   CKD stage 3 due to type 2 diabetes mellitus ([place])   DM (diabetes mellitus), type 2 with renal complications (HCC)   Acute combined systolic and diastolic heart failure (HCC)   DCM (dilated cardiomyopathy) ([place])   Uncontrolled hypertension   Abdominal pain   Cholelithiasis   Elevated LFTs   Acute pulmonary edema (HCC)   Cholecystitis   Cerebral thrombosis with cerebral infarction   LOS: 1 day

## 2018-06-25 DIAGNOSIS — I420 Dilated cardiomyopathy: Secondary | ICD-10-CM

## 2018-06-25 DIAGNOSIS — I10 Essential (primary) hypertension: Secondary | ICD-10-CM

## 2018-06-25 DIAGNOSIS — I6330 Cerebral infarction due to thrombosis of unspecified cerebral artery: Secondary | ICD-10-CM

## 2018-06-25 LAB — COMPREHENSIVE METABOLIC PANEL
ALT: 104 U/L — ABNORMAL HIGH (ref 0–44)
AST: 27 U/L (ref 15–41)
Albumin: 3 g/dL — ABNORMAL LOW (ref 3.5–5.0)
Alkaline Phosphatase: 184 U/L — ABNORMAL HIGH (ref 38–126)
Anion gap: 12 (ref 5–15)
BUN: 14 mg/dL (ref 8–23)
CO2: 25 mmol/L (ref 22–32)
Calcium: 9 mg/dL (ref 8.9–10.3)
Chloride: 101 mmol/L (ref 98–111)
Creatinine, Ser: 1.15 mg/dL — ABNORMAL HIGH (ref 0.44–1.00)
GFR calc Af Amer: 48 mL/min — ABNORMAL LOW (ref >60)
GFR calc non Af Amer: 42 mL/min — ABNORMAL LOW (ref >60)
Glucose, Bld: 144 mg/dL — ABNORMAL HIGH (ref 70–99)
Potassium: 3.1 mmol/L — ABNORMAL LOW (ref 3.5–5.1)
Sodium: 138 mmol/L (ref 135–145)
Total Bilirubin: 0.9 mg/dL (ref 0.3–1.2)
Total Protein: 6.6 g/dL (ref 6.5–8.1)

## 2018-06-25 LAB — LIPID PANEL
Cholesterol: 202 mg/dL — ABNORMAL HIGH (ref 0–200)
HDL: 63 mg/dL (ref >40)
LDL Cholesterol: 126 mg/dL — ABNORMAL HIGH (ref 0–99)
Total CHOL/HDL Ratio: 3.2 RATIO
Triglycerides: 67 mg/dL (ref <150)
VLDL: 13 mg/dL (ref 0–40)

## 2018-06-25 LAB — CBC WITH DIFFERENTIAL/PLATELET
Abs Immature Granulocytes: 0 10*3/uL (ref 0.0–0.1)
Basophils Absolute: 0 10*3/uL (ref 0.0–0.1)
Basophils Relative: 0 %
Eosinophils Absolute: 0 10*3/uL (ref 0.0–0.7)
Eosinophils Relative: 0 %
HCT: 33.6 % — ABNORMAL LOW (ref 36.0–46.0)
Hemoglobin: 11 g/dL — ABNORMAL LOW (ref 12.0–15.0)
Immature Granulocytes: 0 %
Lymphocytes Relative: 21 %
Lymphs Abs: 1.6 10*3/uL (ref 0.7–4.0)
MCH: 29.3 pg (ref 26.0–34.0)
MCHC: 32.7 g/dL (ref 30.0–36.0)
MCV: 89.6 fL (ref 78.0–100.0)
Monocytes Absolute: 0.4 10*3/uL (ref 0.1–1.0)
Monocytes Relative: 5 %
Neutro Abs: 5.4 10*3/uL (ref 1.7–7.7)
Neutrophils Relative %: 74 %
Platelets: 290 10*3/uL (ref 150–400)
RBC: 3.75 MIL/uL — ABNORMAL LOW (ref 3.87–5.11)
RDW: 17.3 % — ABNORMAL HIGH (ref 11.5–15.5)
WBC: 7.4 10*3/uL (ref 4.0–10.5)

## 2018-06-25 LAB — GLUCOSE, CAPILLARY
Glucose-Capillary: 131 mg/dL — ABNORMAL HIGH (ref 70–99)
Glucose-Capillary: 177 mg/dL — ABNORMAL HIGH (ref 70–99)
Glucose-Capillary: 123 mg/dL — ABNORMAL HIGH (ref 70–99)
Glucose-Capillary: 126 mg/dL — ABNORMAL HIGH (ref 70–99)

## 2018-06-25 LAB — HEMOGLOBIN A1C
Hgb A1c MFr Bld: 5.9 % — ABNORMAL HIGH (ref 4.8–5.6)
Mean Plasma Glucose: 122.63 mg/dL

## 2018-06-25 MED ORDER — POTASSIUM CHLORIDE CRYS ER 20 MEQ PO TBCR
40.0000 meq | EXTENDED_RELEASE_TABLET | Freq: Every day | ORAL | Status: DC
  Administered 2018-06-25 – 2018-06-27 (×3): 40 meq via ORAL
  Filled 2018-06-25 (×3): qty 2

## 2018-06-25 NOTE — Plan of Care (Signed)
  Problem: Coping: Goal: Level of anxiety will decrease Outcome: Completed/Met   Problem: Pain Managment: Goal: General experience of comfort will improve Outcome: Completed/Met   Problem: Safety: Goal: Ability to remain free from injury will improve Outcome: Completed/Met

## 2018-06-25 NOTE — Progress Notes (Signed)
Progress Note  Patient Name: [name] [name] Date of Encounter: 06/25/2018  Primary Cardiologist: Chilton Si, MD   Subjective   She is doing well this morning and denies chest pain, palpitations, abdominal pain, leg swelling, and shortness of breath.  Inpatient Medications    Scheduled Meds: . allopurinol  100 mg Oral Daily  . carvedilol  25 mg Oral BID WC  . furosemide  40 mg Oral Daily  . gabapentin  100 mg Oral QHS  . hydrALAZINE  100 mg Oral TID  . hydrocortisone sod succinate (SOLU-CORTEF) inj  50 mg Intravenous Q8H  . insulin aspart  0-9 Units Subcutaneous TID WC  . isosorbide mononitrate  30 mg Oral Daily  . losartan  50 mg Oral Daily   Continuous Infusions: . piperacillin-tazobactam (ZOSYN)  IV 3.375 g (06/25/18 0716)   PRN Meds: acetaminophen **OR** acetaminophen, albuterol, hydrALAZINE, ondansetron **OR** ondansetron (ZOFRAN) IV   Vital Signs    Vitals:   06/24/18 1700 06/24/18 2104 06/25/18 0301 06/25/18 0530  BP:  134/76  (!) 175/94  Pulse:  80  79  Resp:  19  16  Temp: 97.7 F (36.5 C) 98.1 F (36.7 C)  97.8 F (36.6 C)  TempSrc:  Oral  Oral  SpO2:  96%  99%  Weight:   77.8 kg   Height:        Intake/Output Summary (Last 24 hours) at 06/25/2018 1138 Last data filed at 06/25/2018 0849 Gross per 24 hour  Intake 240 ml  Output 800 ml  Net -560 ml   Filed Weights   06/23/18 0335 06/24/18 0537 06/25/18 0301  Weight: 78.9 kg 81.3 kg 77.8 kg    Telemetry    Sinus rhythm- Personally Reviewed  ECG    NA - Personally Reviewed  Physical Exam   GEN: No acute distress.   Neck: No JVD Cardiac: RRR, no murmurs, rubs, or gallops.  Respiratory: Clear to auscultation bilaterally. GI: Soft, nontender, non-distended  MS: No edema; No deformity. Neuro:  Nonfocal  Psych: Normal affect   Labs    Chemistry Recent Labs  Lab 06/23/18 0500 06/24/18 0524 06/25/18 0607  NA 139 137 138  K 3.7 3.5 3.1*  CL 103 100 101  CO2 27 27 25     GLUCOSE 89 159* 144*  BUN 18 17 14   CREATININE 1.18* 1.21* 1.15*  CALCIUM 9.1 8.9 9.0  PROT 6.6 6.2* 6.6  ALBUMIN 3.2* 3.0* 3.0*  AST 120* 43* 27  ALT 204* 140* 104*  ALKPHOS 249* 225* 184*  BILITOT 1.2 0.7 0.9  GFRNONAA 41* 39* 42*  GFRAA 47* 46* 48*  ANIONGAP 9 10 12      Hematology Recent Labs  Lab 06/23/18 0500 06/24/18 0524 06/25/18 0607  WBC 8.3 5.9 7.4  RBC 3.51* 3.38* 3.75*  HGB 10.3* 10.1* 11.0*  HCT 31.8* 30.3* 33.6*  MCV 90.6 89.6 89.6  MCH 29.3 29.9 29.3  MCHC 32.4 33.3 32.7  RDW 17.2* 17.0* 17.3*  PLT 306 266 290    Cardiac Enzymes Recent Labs  Lab 06/22/18 2153 06/23/18 0500 06/23/18 1015 06/23/18 1628  TROPONINI <0.03 0.03* 0.03* 0.03*   No results for input(s): TROPIPOC in the last 168 hours.   BNP Recent Labs  Lab 06/22/18 2153  BNP 1,146.7*     DDimer No results for input(s): DDIMER in the last 168 hours.   Radiology    Mr 06/25/18 Head Wo Contrast  Result Date: 06/24/2018 CLINICAL DATA:  Stroke follow-up EXAM: MRA HEAD  WITHOUT CONTRAST TECHNIQUE: Angiographic images of the Circle of Willis were obtained using MRA technique without intravenous contrast. COMPARISON:  Brain MRI 06/24/2018 FINDINGS: ANTERIOR CIRCULATION: --Intracranial internal carotid arteries: Normal. --Anterior cerebral arteries: Normal. Both A1 segments are present. Patent anterior communicating artery. --Middle cerebral arteries: Normal. --Posterior communicating arteries: Present on the right POSTERIOR CIRCULATION: --Basilar artery: Normal. --Posterior cerebral arteries: Normal. --Superior cerebellar arteries: Normal. --Inferior cerebellar arteries: Normal anterior and posterior inferior cerebellar arteries. IMPRESSION: Normal intracranial MRA. Electronically Signed   By: Deatra Robinson [name]   On: 06/24/2018 15:39   Mr Brain Wo Contrast  Result Date: 06/24/2018 CLINICAL DATA:  84 year old female who was found with left side weakness and difficult to arouse this morning. Code  stroke. EXAM: MRI HEAD WITHOUT CONTRAST TECHNIQUE: Multiplanar, multiecho pulse sequences of the brain and surrounding structures were obtained without intravenous contrast. COMPARISON:  Head CT without contrast 0628 hours today. Brain MRI 01/17/2014. FINDINGS: Brain: There are 1 or more small foci of left occipital horn periventricular white matter abnormal trace diffusion (series 3, image 18) which appear isointense to mildly facilitated on ADC. Associated T2 and FLAIR hyperintensity with no hemorrhage or mass effect. These are new since 2015. Superimposed chronic but progressed since 2015 patchy and confluent additional bilateral periventricular white matter T2 and FLAIR hyperintensity which is facilitated on diffusion. No convincing restricted diffusion or acute infarct. No midline shift, mass effect, evidence of mass lesion, ventriculomegaly, extra-axial collection or acute intracranial hemorrhage. Cervicomedullary junction and pituitary are within normal limits. Possible small left paracentral pons chronic lacune is stable (series 7, image 10). Tiny chronic lacune in the inferior right cerebellum on image 7 also appears stable. The deep gray matter nuclei remain normal for age. No cortical encephalomalacia or chronic cerebral blood products. Vascular: Major intracranial vascular flow voids are stable since 2015. Skull and upper cervical spine: Negative visible cervical spine. Visualized bone marrow signal is within normal limits. Sinuses/Orbits: Stable and negative. 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 Mastoid air cells remain well pneumatized. Grossly normal visible internal auditory structures. Scalp and face soft tissues appear negative. IMPRESSION: 1. No acute infarct identified. Small subacute appearing left occipital lobe periventricular white matter infarcts with no associated hemorrhage or mass effect. 2. Mild to moderate for age abnormal periventricular white matter signal  elsewhere has progressed since 2015. Small chronic lacunes suspected in the left pons and cerebellum. Electronically Signed   By: Odessa Fleming [name]   On: 06/24/2018 11:17   Mr Abdomen Mrcp Wo Contrast  Result Date: 06/23/2018 CLINICAL DATA:  Epigastric pain. Elevated LFTs a gallstone. Concern for common bile duct stone. EXAM: MRI ABDOMEN WITHOUT CONTRAST  (INCLUDING MRCP) TECHNIQUE: Multiplanar multisequence MR imaging of the abdomen was performed. Heavily T2-weighted images of the biliary and pancreatic ducts were obtained, and three-dimensional MRCP images were rendered by post processing. COMPARISON:  Ultrasound 06/22/2018 FINDINGS: Lower chest:  Lung bases are clear. Hepatobiliary: No intrahepatic biliary duct dilatation. Gallbladder nondistended. Sludge in gallstones described on comparison ultrasound are difficult to define. No intrahepatic duct dilatation. The common hepatic duct measures 14 mm. Duct tapers to 9 mm through the common bile duct. Duct measures 9 mm at the ampullary region. No evidence clear stone within the common bile duct. There is some dependent material within the common bile duct through the pancreatic head region (image 24/8). This may represent sludge. No obstructing lesion at the ampulla. Potential sludge within the distal common bile duct (image 25/8) layers dependently. Pancreas:  Pancreatic duct is nondilated. No pancreatic inflammation. Spleen: Normal spleen. Adrenals/urinary tract: Adrenal glands normal. Benign-appearing cyst of the RIGHT kidney Stomach/Bowel: Stomach and limited of the small bowel is unremarkable Vascular/Lymphatic: Abdominal aortic normal caliber. No retroperitoneal periportal lymphadenopathy. Musculoskeletal: No aggressive osseous lesion IMPRESSION: 1. Mild dilatation of the common hepatic duct and common bile duct. No obstructing lesion is identified. Small amount sludge layers dependently within the common bile duct. 2. No gallstones evident. Gallstones and  sludge identified on comparison ultrasound. 3. No intrahepatic duct dilatation. 4. No pancreatic inflammation Electronically Signed   By: Genevive Bi [name]   On: 06/23/2018 12:22   Mr 3d Recon At Scanner  Result Date: 06/23/2018 CLINICAL DATA:  Epigastric pain. Elevated LFTs a gallstone. Concern for common bile duct stone. EXAM: MRI ABDOMEN WITHOUT CONTRAST  (INCLUDING MRCP) TECHNIQUE: Multiplanar multisequence MR imaging of the abdomen was performed. Heavily T2-weighted images of the biliary and pancreatic ducts were obtained, and three-dimensional MRCP images were rendered by post processing. COMPARISON:  Ultrasound 06/22/2018 FINDINGS: Lower chest:  Lung bases are clear. Hepatobiliary: No intrahepatic biliary duct dilatation. Gallbladder nondistended. Sludge in gallstones described on comparison ultrasound are difficult to define. No intrahepatic duct dilatation. The common hepatic duct measures 14 mm. Duct tapers to 9 mm through the common bile duct. Duct measures 9 mm at the ampullary region. No evidence clear stone within the common bile duct. There is some dependent material within the common bile duct through the pancreatic head region (image 24/8). This may represent sludge. No obstructing lesion at the ampulla. Potential sludge within the distal common bile duct (image 25/8) layers dependently. Pancreas: Pancreatic duct is nondilated. No pancreatic inflammation. Spleen: Normal spleen. Adrenals/urinary tract: Adrenal glands normal. Benign-appearing cyst of the RIGHT kidney Stomach/Bowel: Stomach and limited of the small bowel is unremarkable Vascular/Lymphatic: Abdominal aortic normal caliber. No retroperitoneal periportal lymphadenopathy. Musculoskeletal: No aggressive osseous lesion IMPRESSION: 1. Mild dilatation of the common hepatic duct and common bile duct. No obstructing lesion is identified. Small amount sludge layers dependently within the common bile duct. 2. No gallstones evident.  Gallstones and sludge identified on comparison ultrasound. 3. No intrahepatic duct dilatation. 4. No pancreatic inflammation Electronically Signed   By: Genevive Bi [name]   On: 06/23/2018 12:22   Ct Head Code Stroke Wo Contrast  Result Date: 06/24/2018 CLINICAL DATA:  Code stroke. Initial evaluation for acute left arm weakness, slurred speech. EXAM: CT HEAD WITHOUT CONTRAST TECHNIQUE: Contiguous axial images were obtained from the base of the skull through the vertex without intravenous contrast. COMPARISON:  Prior CT from 05/29/2017 FINDINGS: Brain: Atrophy with moderate chronic microvascular ischemic disease. No acute intracranial hemorrhage. No acute large vessel territory infarct. No mass lesion, midline shift or mass effect. No hydrocephalus. No extra-axial fluid collection. Vascular: No hyperdense vessel. Scattered vascular calcifications noted within the carotid siphons. Skull: Scalp soft tissues and calvarium within normal limits. Sinuses/Orbits: Globes and orbital soft tissues demonstrate no acute finding. Mild layering opacity within left sphenoid sinus. Small right mastoid effusion. Other: None. ASPECTS [facility] Stroke Program Early CT Score) - Ganglionic level infarction (caudate, lentiform nuclei, internal capsule, insula, M1-M3 cortex): 7 - Supraganglionic infarction (M4-M6 cortex): 3 Total score (0-10 with 10 being normal): 10 IMPRESSION: 1. No acute intracranial infarct or other abnormality. 2. ASPECTS is 10. 3. Atrophy with moderate chronic small vessel ischemic disease. 4. Mild acute left sphenoid sinusitis. These results were communicated to Dr. Laurence Slate At 6:34 amon 8/16/2019by text page via the [facility] messaging  system. Electronically Signed   By: Rise Mu [name]   On: 06/24/2018 06:35    Cardiac Studies   Echocardiogram 06/24/2018:  Study Conclusions  - Left ventricle: The cavity size was normal. Wall thickness was   increased in a pattern of mild LVH. Systolic function  was   severely reduced. The estimated ejection fraction was in the   range of 25% to 30%. Diffuse hypokinesis. Doppler parameters are   consistent with abnormal left ventricular relaxation (grade 1   diastolic dysfunction). - Aortic valve: There was no stenosis. There was trivial   regurgitation. - Mitral valve: Mildly calcified annulus. There was mild   regurgitation. - Left atrium: The atrium was moderately dilated. - Right ventricle: The cavity size was normal. Systolic function   was normal. - Atrial septum: Cannot rule out small PFO. - Pulmonary arteries: PA peak pressure: 35 mm Hg (S). - Inferior vena cava: The vessel was normal in size. The   respirophasic diameter changes were in the normal range (>= 50%),   consistent with normal central venous pressure. - Pericardium, extracardiac: A trivial pericardial effusion was   identified.  Impressions:  - Normal LV size with mild LV hypertrophy. EF 25-30% with diffuse   hypokinesis. Normal RV size and systolic function. Mild MR.   Borderline pulmonary hypertension.  Patient Profile     84 y.o. female with non-obstructive CAD, hypertension, rheumatoid arthritis on methotrexate, prednisone, and leflunomide, moderate pulmonary hypertension, chronic systolic and diastolic heart failure, diabetes, PAD, and prior stroke here with gallstone pancreatitis.  Assessment & Plan    1.  Acute on chronic systolic and diastolic heart failure: She appears euvolemic.  Lasix 40 mg p.o. daily was resumed yesterday.  Losartan was also resumed yesterday at a lower dose of 50 mg daily.  She is also on carvedilol, Imdur, and hydralazine.  She takes spironolactone at home but this has not been resumed.  She is hypertensive this morning.  If she remains hypertensive, I would increase the dose of losartan to 75 mg.  2.  Hypertension: She is hypertensive this morning.  If she remains hypertensive, I would increase the dose of losartan to 75 mg.  3.   History of CVA: Head CT negative for acute changes.  Aspirin on hold in preparation for surgery.  4.  Cholecystitis: Surgery is being planned for tomorrow.  She can proceed from a cardiac perspective.      For questions or updates, please contact [facility] Please consult [email or web address] for contact info under Cardiology/STEMI.      Signed, Prentice Docker, MD  06/25/2018, 11:38 AM

## 2018-06-25 NOTE — Progress Notes (Signed)
[facility] PROGRESS NOTE  [name] [name] PXT:[id number] DOB: [birth year] DOA: 06/22/2018 PCP: [name] Deutscher, DO   Narrative:  84 year old female CKD stage III 2/2 DM TY 2 Dilated cardiomyopathy EF 20-30%-recent increase in Lasix dose HTN Rheumatoid arthritis on prednisone previously on Humira Xeljanz Bactrim Orencia--seen by Dr. Eber [name] 1 week prior to this admit no changes to medications other than resumption of methotrexate Chronic lower extremity wounds with PAD in the past by vascular surgery ABIs 11/2017 Prior CVA  Last admitted 05/2018 with new onset heart failure cath at that time showed moderately elevated LVEDP and moderate pulmonary hypertension CO 5.68/CI 2.9-- A small PFO was noted with left-to-right shunt.  Return to hospital 8/14 with abdominal pain, nausea, vomiting  alk phos 240 lipase 150 AST/ALT 119/203 direct bili 2.0 BNP 1146 troponin 0 0.03-MRCP confirm any other findings   A & Plan ?  TIA-ruled out by stroke work-up CT MRI 8/16-await neurology and cardiology input from my perspective should not be an impediment if cleared by them to go for surgery this weekend  Cholecystitis-continue perioperative aspirin-defer to surgeon for further planning sphincterotomy/lap chole?--d/w Dr. Rosendo [name]  Rheumatoid arthritis significant osteoarthritis component-taking methotrexate on chronic steroids and has been on stress dose Solu-Cortef every 8 hourly need this dose perioperatively  Dilated cardiomyopathy/nonischemic per cath last month- continue Coreg 25 twice daily, hydralazine 100 3 times daily, Imdur 30 daily Losartan dose 100-->50 per cardiology It appears Aldactone has been discontinued? Continue Lasix 40 I/O -2.1 so far, weight 81-->78  Mild hypokalemia-replaced with K. Dur  Diabetes mellitus-Metformin 500 daily on hold continue SSI sugars reasonable 131-148 range-continue gabapentin  Probable anemia chronic disease continue ferrous sulfate twice  daily  Pulmonary hypertension-as above  Gout continue allopurinol 100 daily (not 200)  Probable carpal tunnel       DVT prophylaxis: Lovenox code Status: Full code family Communication: Daughter disposition Plan: Defer to neurology and cardiology final clearance however from my perspective possibly can go for surgery avoiding significant swings in blood pressure and monitoring for A. fib on monitors overnight on the a.m. of 8/18-not ready for discharge expect will be multiple days and will need skilled level care on discharge   [name] Chait, MD  [facility] Direct contact: [phone] --Via amion app OR  --[email or web address]; password [email or web address]  7PM-7AM contact night coverage as above 06/25/2018, 8:35 AM  LOS: 2 days   Consultants:  Cardiology  General surgery  Neurology  Procedures:  CT head/MRI brain 8/16/echocardiogram 8/16  MRCP abdomen  Ultrasound abdomen  Antimicrobials:  Zosyn  Interval history/Subjective:  Baseline without issues Mild length talking tolerated diet last night No abdominal pain No fever  Objective:  Vitals:  Vitals:   06/24/18 2104 06/25/18 0530  BP: 134/76 (!) 175/94  Pulse: 80 79  Resp: 19 16  Temp: 98.1 F (36.7 C) 97.8 F (36.6 C)  SpO2: 96% 99%    Exam:  Smile symmetric  S1-S2 regular rate rhythm-sinus rhythm Chest is clinically clear no added sound Abdomen no guarding no Murphy's tenderness No lower extremity edema able to move all 4 extremities with 5/5 power in the ankles upper extremities biceps triceps reflexes deferred uvula is midline shoulder shrug is normal  I have personally reviewed the following:   Labs:  Potassium 3.1 BUN/creatinine about the same as prior alk phos 184 down from 225 AST down to 27 from 43 ALT down to 104 from 140 total bili 0.9  WBC 7.4  Imaging studies:  CT scan of head 8/16  shows no stroke  MRI brain shows no stroke  Echocardiogram 30 25%, mild LV hypertrophy mild MR borderline  pulmonary hypertension  Medical tests:  No  Test discussed with performing physician:  No  Decision to obtain old records:  Y  Review and summation of old records:  N  Scheduled Meds: . allopurinol  100 mg Oral Daily  . carvedilol  25 mg Oral BID WC  . furosemide  40 mg Oral Daily  . gabapentin  100 mg Oral QHS  . hydrALAZINE  100 mg Oral TID  . hydrocortisone sod succinate (SOLU-CORTEF) inj  50 mg Intravenous Q8H  . insulin aspart  0-9 Units Subcutaneous TID WC  . isosorbide mononitrate  30 mg Oral Daily  . losartan  50 mg Oral Daily   Continuous Infusions: . piperacillin-tazobactam (ZOSYN)  IV 3.375 g (06/25/18 0716)    Active Problems:   Rheumatoid arthritis involving multiple sites ([place])   Anemia of chronic disease   CKD stage 3 due to type 2 diabetes mellitus ([place])   DM (diabetes mellitus), type 2 with renal complications ([facility])   Acute combined systolic and diastolic heart failure ([facility])   DCM (dilated cardiomyopathy) ([place])   Uncontrolled hypertension   Abdominal pain   Cholelithiasis   Elevated LFTs   Acute pulmonary edema ([facility])   Cholecystitis   Cerebral thrombosis with cerebral infarction   LOS: 2 days

## 2018-06-25 NOTE — Plan of Care (Signed)
  Problem: Coping: Goal: Level of anxiety will decrease Outcome: Completed/Met   Problem: Pain Managment: Goal: General experience of comfort will improve Outcome: Completed/Met   Problem: Safety: Goal: Ability to remain free from injury will improve Outcome: Completed/Met   Problem: Skin Integrity: Goal: Risk for impaired skin integrity will decrease Outcome: Completed/Met   Problem: Coping: Goal: Level of anxiety will decrease Outcome: Completed/Met   Problem: Pain Managment: Goal: General experience of comfort will improve Outcome: Completed/Met   Problem: Safety: Goal: Ability to remain free from injury will improve Outcome: Completed/Met   Problem: Skin Integrity: Goal: Risk for impaired skin integrity will decrease Outcome: Completed/Met

## 2018-06-25 NOTE — Evaluation (Signed)
Occupational Therapy Evaluation Patient Details Name: [name] [name] MRN: [id number] DOB: [birth year] Today's Date: 06/25/2018    History of Present Illness [name] [name] is a 84 y.o. female with history of chronic systolic heart failure last EF measured last month was 20 to 25%, hypertension, diabetes mellitus, rheumatoid arthritis who was recently admitted for acute CHF and discharged to rehab and had gone back to home last week started experiencing nausea with abdominal pain last 3 days.  Unable to eat usual food because of the nausea and epigastric pain.  Epigastric pain radiates to the back and has been episodic and at times constant but denies any chest pain or shortness of breath.  Denies any diarrhea.   Clinical Impression   PTA, pt had assistance for many ADL and IADL from aide and family but was able to complete toilet transfers and functional mobility with rollator without assistance. She currently presents with significant fatigue and decreased activity tolerance for ADL participation. She requires min assist for toilet transfers and was only able to tolerate a short distance today. She requires max assist for LB ADL. Pt with arthritic changes to R wrist and reports significant R sided pain due to arthritis. Pt unable to have 24 hour assistance at home and would benefit from short-term SNF placement to maximize functional participation and safety. She reports that her family is working on setting up increased assistance at home.     Follow Up Recommendations  SNF;Supervision/Assistance - 24 hour    Equipment Recommendations  None recommended by OT    Recommendations for Other Services       Precautions / Restrictions Precautions Precautions: Fall Restrictions Weight Bearing Restrictions: No      Mobility Bed Mobility Overal bed mobility: Needs Assistance Bed Mobility: Supine to Sit;Sit to Supine     Supine to sit: Min guard;HOB elevated Sit to supine: Min assist    General bed mobility comments: Assistance required to return RLE back to bed.   Transfers Overall transfer level: Needs assistance Equipment used: Rolling walker (2 wheeled) Transfers: Sit to/from Stand Sit to Stand: Min assist         General transfer comment: Assistance for power up to standing and maintaining upright due to fatigue.     Balance Overall balance assessment: Needs assistance Sitting-balance support: Single extremity supported Sitting balance-Leahy Scale: Good     Standing balance support: Bilateral upper extremity supported Standing balance-Leahy Scale: Poor                             ADL either performed or assessed with clinical judgement   ADL Overall ADL's : Needs assistance/impaired Eating/Feeding: Set up;Sitting   Grooming: Sitting;Supervision/safety   Upper Body Bathing: Supervision/ safety;Sitting   Lower Body Bathing: Maximal assistance;Sit to/from stand   Upper Body Dressing : Supervision/safety;Sitting   Lower Body Dressing: Maximal assistance;Sit to/from stand   Toilet Transfer: Minimal assistance;Ambulation;RW Toilet Transfer Details (indicate cue type and reason): only able to tolerate a few steps today Toileting- Clothing Manipulation and Hygiene: Minimal assistance;Sit to/from stand       Functional mobility during ADLs: Minimal assistance;Rolling walker General ADL Comments: Pt fatiguing very easily and demonstrating decreased activity tolerance for ADL.      Vision Patient Visual Report: No change from baseline Vision Assessment?: No apparent visual deficits     Perception     Praxis      Pertinent Vitals/Pain Pain Assessment: Faces Faces  Pain Scale: Hurts a little bit Pain Location: generalized; abdominal     Hand Dominance Right   Extremity/Trunk Assessment Upper Extremity Assessment Upper Extremity Assessment: Generalized weakness;RUE deficits/detail;LUE deficits/detail RUE Deficits / Details:  Arthritic changes greater than on the L. Reports increased stiffness in the mornings.  LUE Deficits / Details: Arthritic changes at wrist noted but not as prominent as the R.    Lower Extremity Assessment Lower Extremity Assessment: Generalized weakness   Cervical / Trunk Assessment Cervical / Trunk Assessment: Kyphotic   Communication Communication Communication: No difficulties   Cognition Arousal/Alertness: Awake/alert Behavior During Therapy: WFL for tasks assessed/performed Overall Cognitive Status: Within Functional Limits for tasks assessed                                     General Comments  Pt's daughter and grandson present during session.     Exercises     Shoulder Instructions      Home Living Family/patient expects to be discharged to:: Private residence Living Arrangements: Alone Available Help at Discharge: Personal care attendant;Family;Available PRN/intermittently Type of Home: Other(Comment)("senior living apartment") Home Access: Level entry     Home Layout: One level     Bathroom Shower/Tub: Producer, television/film/video: Handicapped height     Home Equipment: Stage manager - 4 wheels;Walker - 2 wheels;Cane - single point;Tub bench;Wheelchair - manual;Grab bars - toilet   Additional Comments: Aide 7 days/week in the AM; family at times throughout the day      Prior Functioning/Environment Level of Independence: Needs assistance  Gait / Transfers Assistance Needed: Uses rollator for functional mobility.  ADL's / Homemaking Assistance Needed: Aide assists with ADL, meals, and bed mobility. Family assists in the evenings.             OT Problem List: Decreased strength;Decreased range of motion;Decreased activity tolerance;Impaired balance (sitting and/or standing);Decreased safety awareness;Decreased knowledge of use of DME or AE;Decreased knowledge of precautions;Pain;Impaired UE functional use      OT  Treatment/Interventions: Self-care/ADL training;Therapeutic exercise;Energy conservation;DME and/or AE instruction;Therapeutic activities;Patient/family education;Balance training    OT Goals(Current goals can be found in the care plan section) Acute Rehab OT Goals Patient Stated Goal: to have more energy OT Goal Formulation: With patient Time For Goal Achievement: 07/09/18 Potential to Achieve Goals: Good ADL Goals Pt Will Perform Grooming: with modified independence;standing Pt Will Perform Upper Body Dressing: with modified independence;sitting Pt Will Perform Lower Body Dressing: with min assist;sit to/from stand Pt Will Transfer to Toilet: with modified independence;ambulating;regular height toilet;bedside commode(BSC over toilet) Pt Will Perform Toileting - Clothing Manipulation and hygiene: with modified independence;sit to/from stand  OT Frequency: Min 2X/week   Barriers to D/C:            Co-evaluation              AM-PAC PT "6 Clicks" Daily Activity     Outcome Measure Help from another person eating meals?: None Help from another person taking care of personal grooming?: A Little Help from another person toileting, which includes using toliet, bedpan, or urinal?: A Little Help from another person bathing (including washing, rinsing, drying)?: A Little Help from another person to put on and taking off regular upper body clothing?: None Help from another person to put on and taking off regular lower body clothing?: A Little 6 Click Score: 20   End of Session Equipment  Utilized During Treatment: Gait belt;Rolling walker Nurse Communication: Mobility status  Activity Tolerance: Patient tolerated treatment well Patient left: in bed;with call bell/phone within reach  OT Visit Diagnosis: Pain;Muscle weakness (generalized) (M62.81) Pain - part of body: (abdominal)                Time: 1962-2297 OT Time Calculation (min): 15 min Charges:  OT General Charges $OT  Visit: 1 Visit OT Evaluation $OT Eval Moderate Complexity: 1 Mod  Doristine Section, MS OTR/L  Pager: [phone]   Karsyn Jamie A Oluwatamilore Starnes 06/25/2018, 2:50 PM

## 2018-06-25 NOTE — Progress Notes (Signed)
Pt is stable throughout the day, except some feelings of weakness. According to the patient " I am tired, probably I am only on liquid diet". Vitals stable, CBG maintained so far  Consent taken for surgery tomorrow Family members aware  Will continue to monitor the patient  Lonia Farber, RN

## 2018-06-25 NOTE — Progress Notes (Signed)
Subjective/Chief Complaint: Pt doing well this AM Min abd pain   Objective: Vital signs in last 24 hours: Temp:  [97.7 F (36.5 C)-98.1 F (36.7 C)] 97.8 F (36.6 C) (08/17 0530) Pulse Rate:  [79-86] 79 (08/17 0530) Resp:  [16-19] 16 (08/17 0530) BP: (134-175)/(76-102) 175/94 (08/17 0530) SpO2:  [96 %-100 %] 99 % (08/17 0530) Weight:  [77.8 kg] 77.8 kg (08/17 0301) Last BM Date: 06/23/18  Intake/Output from previous day: 08/16 0701 - 08/17 0700 In: 240 [P.O.:240] Out: 600 [Urine:600] Intake/Output this shift: Total I/O In: -  Out: 200 [Urine:200]  Constitutional: No acute distress, conversant, appears states age. Eyes: Anicteric sclerae, moist conjunctiva, no lid lag Lungs: Clear to auscultation bilaterally, normal respiratory effort CV: regular rate and rhythm, no murmurs, no peripheral edema, pedal pulses 2+ GI: Soft, no masses or hepatosplenomegaly, non-tender to palpation Skin: No rashes, palpation reveals normal turgor Psychiatric: appropriate judgment and insight, oriented to person, place, and time   Lab Results:  Recent Labs    06/24/18 0524 06/25/18 0607  WBC 5.9 7.4  HGB 10.1* 11.0*  HCT 30.3* 33.6*  PLT 266 290   BMET Recent Labs    06/24/18 0524 06/25/18 0607  NA 137 138  K 3.5 3.1*  CL 100 101  CO2 27 25  GLUCOSE 159* 144*  BUN 17 14  CREATININE 1.21* 1.15*  CALCIUM 8.9 9.0   PT/INR No results for input(s): LABPROT, INR in the last 72 hours. ABG No results for input(s): PHART, HCO3 in the last 72 hours.  Invalid input(s): PCO2, PO2  Studies/Results: Mr Shirlee Latch Wo Contrast  Result Date: 06/24/2018 CLINICAL DATA:  Stroke follow-up EXAM: MRA HEAD WITHOUT CONTRAST TECHNIQUE: Angiographic images of the Circle of Willis were obtained using MRA technique without intravenous contrast. COMPARISON:  Brain MRI 06/24/2018 FINDINGS: ANTERIOR CIRCULATION: --Intracranial internal carotid arteries: Normal. --Anterior cerebral arteries: Normal.  Both A1 segments are present. Patent anterior communicating artery. --Middle cerebral arteries: Normal. --Posterior communicating arteries: Present on the right POSTERIOR CIRCULATION: --Basilar artery: Normal. --Posterior cerebral arteries: Normal. --Superior cerebellar arteries: Normal. --Inferior cerebellar arteries: Normal anterior and posterior inferior cerebellar arteries. IMPRESSION: Normal intracranial MRA. Electronically Signed   By: Deatra Robinson [name]   On: 06/24/2018 15:39   Mr Brain Wo Contrast  Result Date: 06/24/2018 CLINICAL DATA:  84 year old female who was found with left side weakness and difficult to arouse this morning. Code stroke. EXAM: MRI HEAD WITHOUT CONTRAST TECHNIQUE: Multiplanar, multiecho pulse sequences of the brain and surrounding structures were obtained without intravenous contrast. COMPARISON:  Head CT without contrast 0628 hours today. Brain MRI 01/17/2014. FINDINGS: Brain: There are 1 or more small foci of left occipital horn periventricular white matter abnormal trace diffusion (series 3, image 18) which appear isointense to mildly facilitated on ADC. Associated T2 and FLAIR hyperintensity with no hemorrhage or mass effect. These are new since 2015. Superimposed chronic but progressed since 2015 patchy and confluent additional bilateral periventricular white matter T2 and FLAIR hyperintensity which is facilitated on diffusion. No convincing restricted diffusion or acute infarct. No midline shift, mass effect, evidence of mass lesion, ventriculomegaly, extra-axial collection or acute intracranial hemorrhage. Cervicomedullary junction and pituitary are within normal limits. Possible small left paracentral pons chronic lacune is stable (series 7, image 10). Tiny chronic lacune in the inferior right cerebellum on image 7 also appears stable. The deep gray matter nuclei remain normal for age. No cortical encephalomalacia or chronic cerebral blood products. Vascular: Major  intracranial vascular  flow voids are stable since 2015. Skull and upper cervical spine: Negative visible cervical spine. Visualized bone marrow signal is within normal limits. Sinuses/Orbits: Stable and negative. 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 Mastoid air cells remain well pneumatized. Grossly normal visible internal auditory structures. Scalp and face soft tissues appear negative. IMPRESSION: 1. No acute infarct identified. Small subacute appearing left occipital lobe periventricular white matter infarcts with no associated hemorrhage or mass effect. 2. Mild to moderate for age abnormal periventricular white matter signal elsewhere has progressed since 2015. Small chronic lacunes suspected in the left pons and cerebellum. Electronically Signed   By: Odessa Fleming [name]   On: 06/24/2018 11:17   Mr Abdomen Mrcp Wo Contrast  Result Date: 06/23/2018 CLINICAL DATA:  Epigastric pain. Elevated LFTs a gallstone. Concern for common bile duct stone. EXAM: MRI ABDOMEN WITHOUT CONTRAST  (INCLUDING MRCP) TECHNIQUE: Multiplanar multisequence MR imaging of the abdomen was performed. Heavily T2-weighted images of the biliary and pancreatic ducts were obtained, and three-dimensional MRCP images were rendered by post processing. COMPARISON:  Ultrasound 06/22/2018 FINDINGS: Lower chest:  Lung bases are clear. Hepatobiliary: No intrahepatic biliary duct dilatation. Gallbladder nondistended. Sludge in gallstones described on comparison ultrasound are difficult to define. No intrahepatic duct dilatation. The common hepatic duct measures 14 mm. Duct tapers to 9 mm through the common bile duct. Duct measures 9 mm at the ampullary region. No evidence clear stone within the common bile duct. There is some dependent material within the common bile duct through the pancreatic head region (image 24/8). This may represent sludge. No obstructing lesion at the ampulla. Potential sludge within the distal common bile  duct (image 25/8) layers dependently. Pancreas: Pancreatic duct is nondilated. No pancreatic inflammation. Spleen: Normal spleen. Adrenals/urinary tract: Adrenal glands normal. Benign-appearing cyst of the RIGHT kidney Stomach/Bowel: Stomach and limited of the small bowel is unremarkable Vascular/Lymphatic: Abdominal aortic normal caliber. No retroperitoneal periportal lymphadenopathy. Musculoskeletal: No aggressive osseous lesion IMPRESSION: 1. Mild dilatation of the common hepatic duct and common bile duct. No obstructing lesion is identified. Small amount sludge layers dependently within the common bile duct. 2. No gallstones evident. Gallstones and sludge identified on comparison ultrasound. 3. No intrahepatic duct dilatation. 4. No pancreatic inflammation Electronically Signed   By: Genevive Bi [name]   On: 06/23/2018 12:22   Mr 3d Recon At Scanner  Result Date: 06/23/2018 CLINICAL DATA:  Epigastric pain. Elevated LFTs a gallstone. Concern for common bile duct stone. EXAM: MRI ABDOMEN WITHOUT CONTRAST  (INCLUDING MRCP) TECHNIQUE: Multiplanar multisequence MR imaging of the abdomen was performed. Heavily T2-weighted images of the biliary and pancreatic ducts were obtained, and three-dimensional MRCP images were rendered by post processing. COMPARISON:  Ultrasound 06/22/2018 FINDINGS: Lower chest:  Lung bases are clear. Hepatobiliary: No intrahepatic biliary duct dilatation. Gallbladder nondistended. Sludge in gallstones described on comparison ultrasound are difficult to define. No intrahepatic duct dilatation. The common hepatic duct measures 14 mm. Duct tapers to 9 mm through the common bile duct. Duct measures 9 mm at the ampullary region. No evidence clear stone within the common bile duct. There is some dependent material within the common bile duct through the pancreatic head region (image 24/8). This may represent sludge. No obstructing lesion at the ampulla. Potential sludge within the distal  common bile duct (image 25/8) layers dependently. Pancreas: Pancreatic duct is nondilated. No pancreatic inflammation. Spleen: Normal spleen. Adrenals/urinary tract: Adrenal glands normal. Benign-appearing cyst of the RIGHT kidney Stomach/Bowel: Stomach and limited of the small  bowel is unremarkable Vascular/Lymphatic: Abdominal aortic normal caliber. No retroperitoneal periportal lymphadenopathy. Musculoskeletal: No aggressive osseous lesion IMPRESSION: 1. Mild dilatation of the common hepatic duct and common bile duct. No obstructing lesion is identified. Small amount sludge layers dependently within the common bile duct. 2. No gallstones evident. Gallstones and sludge identified on comparison ultrasound. 3. No intrahepatic duct dilatation. 4. No pancreatic inflammation Electronically Signed   By: Genevive Bi [name]   On: 06/23/2018 12:22   Ct Head Code Stroke Wo Contrast  Result Date: 06/24/2018 CLINICAL DATA:  Code stroke. Initial evaluation for acute left arm weakness, slurred speech. EXAM: CT HEAD WITHOUT CONTRAST TECHNIQUE: Contiguous axial images were obtained from the base of the skull through the vertex without intravenous contrast. COMPARISON:  Prior CT from 05/29/2017 FINDINGS: Brain: Atrophy with moderate chronic microvascular ischemic disease. No acute intracranial hemorrhage. No acute large vessel territory infarct. No mass lesion, midline shift or mass effect. No hydrocephalus. No extra-axial fluid collection. Vascular: No hyperdense vessel. Scattered vascular calcifications noted within the carotid siphons. Skull: Scalp soft tissues and calvarium within normal limits. Sinuses/Orbits: Globes and orbital soft tissues demonstrate no acute finding. Mild layering opacity within left sphenoid sinus. Small right mastoid effusion. Other: None. ASPECTS [facility] Stroke Program Early CT Score) - Ganglionic level infarction (caudate, lentiform nuclei, internal capsule, insula, M1-M3 cortex): 7 -  Supraganglionic infarction (M4-M6 cortex): 3 Total score (0-10 with 10 being normal): 10 IMPRESSION: 1. No acute intracranial infarct or other abnormality. 2. ASPECTS is 10. 3. Atrophy with moderate chronic small vessel ischemic disease. 4. Mild acute left sphenoid sinusitis. These results were communicated to Dr. Laurence Slate At 6:34 amon 8/16/2019by text page via the [facility] messaging system. Electronically Signed   By: Rise Mu [name]   On: 06/24/2018 06:35    Anti-infectives: Anti-infectives (From admission, onward)   Start     Dose/Rate Route Frequency Ordered Stop   06/23/18 1000  piperacillin-tazobactam (ZOSYN) IVPB 3.375 g     3.375 g 12.5 mL/hr over 240 Minutes Intravenous Every 8 hours 06/23/18 0456     06/23/18 0145  piperacillin-tazobactam (ZOSYN) IVPB 3.375 g     3.375 g 100 mL/hr over 30 Minutes Intravenous  Once 06/23/18 0137 06/23/18 0220      Assessment/Plan: Active Problems: Rheumatoid arthritis involving multiple sites (HCC) Anemia of chronic disease CKD stage 3 due to type 2 diabetes mellitus (HCC) DM (diabetes mellitus), type 2 with renal complications (HCC) DCM (dilated cardiomyopathy) (HCC) Uncontrolled hypertension Abdominal pain Cholelithiasis Elevated LFTs  Gallstone pancreatitis -plan for lap chole tomorrow -Per Dr. Mahala Menghini, pt cleared for surgery  FEN: CLD, NPO at midnight VTE: SCD's ID: Zosyn Foley: none Follow up: TBD  DISPO: Plan for OR on Sunday   LOS: 2 days    Marigene Ehlers., [facility] 06/25/2018

## 2018-06-25 NOTE — Progress Notes (Signed)
Short Note:  TIA like event with incidental finding of subacute infract on MRI does not explain patients symptoms. Most likely TIA given stroke risk factors and EF 25%.  Patient currently back to baseline.  Stroke work up complete  Would recommend patient be started on ASA/Lipitor when safe to do so from surgical standpoint due to planned surgery on sunday given stroke risk factors. May consider anticoagulation due to low EF and evidence of stroke. Will need to discuss with patient, family and primary care physician after surgery.  Please call back with any questions

## 2018-06-26 ENCOUNTER — Inpatient Hospital Stay (HOSPITAL_COMMUNITY): Payer: Medicare Other | Admitting: Anesthesiology

## 2018-06-26 ENCOUNTER — Encounter (HOSPITAL_COMMUNITY)
Admission: EM | Disposition: A | Discharge status: Skilled Nursing Facility | Payer: Self-pay | Source: Home / Self Care | Attending: Family Medicine

## 2018-06-26 ENCOUNTER — Encounter (HOSPITAL_COMMUNITY): Payer: Self-pay | Admitting: Anesthesiology

## 2018-06-26 DIAGNOSIS — K8000 Calculus of gallbladder with acute cholecystitis without obstruction: Secondary | ICD-10-CM

## 2018-06-26 HISTORY — PX: CHOLECYSTECTOMY: SHX55

## 2018-06-26 LAB — CBC WITH DIFFERENTIAL/PLATELET
Abs Immature Granulocytes: 0 10*3/uL (ref 0.0–0.1)
Basophils Absolute: 0 10*3/uL (ref 0.0–0.1)
Basophils Relative: 0 %
Eosinophils Absolute: 0 10*3/uL (ref 0.0–0.7)
Eosinophils Relative: 0 %
HCT: 34.2 % — ABNORMAL LOW (ref 36.0–46.0)
Hemoglobin: 11.1 g/dL — ABNORMAL LOW (ref 12.0–15.0)
Immature Granulocytes: 1 %
Lymphocytes Relative: 18 %
Lymphs Abs: 1.4 10*3/uL (ref 0.7–4.0)
MCH: 29.1 pg (ref 26.0–34.0)
MCHC: 32.5 g/dL (ref 30.0–36.0)
MCV: 89.5 fL (ref 78.0–100.0)
Monocytes Absolute: 0.4 10*3/uL (ref 0.1–1.0)
Monocytes Relative: 5 %
Neutro Abs: 6.2 10*3/uL (ref 1.7–7.7)
Neutrophils Relative %: 76 %
Platelets: 263 10*3/uL (ref 150–400)
RBC: 3.82 MIL/uL — ABNORMAL LOW (ref 3.87–5.11)
RDW: 17.6 % — ABNORMAL HIGH (ref 11.5–15.5)
WBC: 8 10*3/uL (ref 4.0–10.5)

## 2018-06-26 LAB — COMPREHENSIVE METABOLIC PANEL
ALT: 80 U/L — ABNORMAL HIGH (ref 0–44)
AST: 24 U/L (ref 15–41)
Albumin: 3 g/dL — ABNORMAL LOW (ref 3.5–5.0)
Alkaline Phosphatase: 161 U/L — ABNORMAL HIGH (ref 38–126)
Anion gap: 8 (ref 5–15)
BUN: 15 mg/dL (ref 8–23)
CO2: 27 mmol/L (ref 22–32)
Calcium: 9 mg/dL (ref 8.9–10.3)
Chloride: 105 mmol/L (ref 98–111)
Creatinine, Ser: 1.11 mg/dL — ABNORMAL HIGH (ref 0.44–1.00)
GFR calc Af Amer: 51 mL/min — ABNORMAL LOW (ref >60)
GFR calc non Af Amer: 44 mL/min — ABNORMAL LOW (ref >60)
Glucose, Bld: 143 mg/dL — ABNORMAL HIGH (ref 70–99)
Potassium: 3.6 mmol/L (ref 3.5–5.1)
Sodium: 140 mmol/L (ref 135–145)
Total Bilirubin: 0.9 mg/dL (ref 0.3–1.2)
Total Protein: 6.5 g/dL (ref 6.5–8.1)

## 2018-06-26 LAB — GLUCOSE, CAPILLARY
Glucose-Capillary: 178 mg/dL — ABNORMAL HIGH (ref 70–99)
Glucose-Capillary: 200 mg/dL — ABNORMAL HIGH (ref 70–99)
Glucose-Capillary: 134 mg/dL — ABNORMAL HIGH (ref 70–99)
Glucose-Capillary: 128 mg/dL — ABNORMAL HIGH (ref 70–99)

## 2018-06-26 LAB — SURGICAL PCR SCREEN
MRSA, PCR: NEGATIVE
Staphylococcus aureus: NEGATIVE

## 2018-06-26 SURGERY — LAPAROSCOPIC CHOLECYSTECTOMY WITH INTRAOPERATIVE CHOLANGIOGRAM
Anesthesia: General | Site: Abdomen

## 2018-06-26 MED ORDER — DEXAMETHASONE SODIUM PHOSPHATE 10 MG/ML IJ SOLN
INTRAMUSCULAR | Status: DC | PRN
  Administered 2018-06-26: 10 mg via INTRAVENOUS

## 2018-06-26 MED ORDER — 0.9 % SODIUM CHLORIDE (POUR BTL) OPTIME
TOPICAL | Status: DC | PRN
  Administered 2018-06-26: 1000 mL

## 2018-06-26 MED ORDER — SODIUM CHLORIDE 0.9 % IV SOLN
INTRAVENOUS | Status: DC | PRN
  Administered 2018-06-26: 25 ug/min via INTRAVENOUS

## 2018-06-26 MED ORDER — PROPOFOL 10 MG/ML IV BOLUS
INTRAVENOUS | Status: AC
  Filled 2018-06-26: qty 20

## 2018-06-26 MED ORDER — LABETALOL HCL 5 MG/ML IV SOLN
10.0000 mg | INTRAVENOUS | Status: AC | PRN
  Administered 2018-06-26 (×2): 10 mg via INTRAVENOUS

## 2018-06-26 MED ORDER — ROCURONIUM BROMIDE 50 MG/5ML IV SOSY
PREFILLED_SYRINGE | INTRAVENOUS | Status: DC | PRN
  Administered 2018-06-26: 40 mg via INTRAVENOUS

## 2018-06-26 MED ORDER — ONDANSETRON HCL 4 MG/2ML IJ SOLN
INTRAMUSCULAR | Status: AC
  Filled 2018-06-26: qty 2

## 2018-06-26 MED ORDER — LIDOCAINE 2% (20 MG/ML) 5 ML SYRINGE
INTRAMUSCULAR | Status: DC | PRN
  Administered 2018-06-26: 50 mg via INTRAVENOUS

## 2018-06-26 MED ORDER — LOSARTAN POTASSIUM 50 MG PO TABS
75.0000 mg | ORAL_TABLET | Freq: Every day | ORAL | Status: DC
  Administered 2018-06-27: 75 mg via ORAL
  Filled 2018-06-26: qty 1

## 2018-06-26 MED ORDER — LOSARTAN POTASSIUM 25 MG PO TABS
25.0000 mg | ORAL_TABLET | Freq: Once | ORAL | Status: AC
  Administered 2018-06-26: 25 mg via ORAL
  Filled 2018-06-26: qty 1

## 2018-06-26 MED ORDER — BUPIVACAINE-EPINEPHRINE (PF) 0.25% -1:200000 IJ SOLN
INTRAMUSCULAR | Status: AC
  Filled 2018-06-26: qty 30

## 2018-06-26 MED ORDER — MENTHOL 3 MG MT LOZG
1.0000 | LOZENGE | OROMUCOSAL | Status: DC | PRN
  Administered 2018-06-26: 3 mg via ORAL
  Filled 2018-06-26: qty 9

## 2018-06-26 MED ORDER — ONDANSETRON HCL 4 MG/2ML IJ SOLN
INTRAMUSCULAR | Status: DC | PRN
  Administered 2018-06-26: 4 mg via INTRAVENOUS

## 2018-06-26 MED ORDER — FENTANYL CITRATE (PF) 100 MCG/2ML IJ SOLN
25.0000 ug | INTRAMUSCULAR | Status: DC | PRN
  Administered 2018-06-26 (×2): 25 ug via INTRAVENOUS

## 2018-06-26 MED ORDER — PROPOFOL 10 MG/ML IV BOLUS
INTRAVENOUS | Status: DC | PRN
  Administered 2018-06-26: 20 mg via INTRAVENOUS
  Administered 2018-06-26: 60 mg via INTRAVENOUS

## 2018-06-26 MED ORDER — LACTATED RINGERS IV SOLN
INTRAVENOUS | Status: DC | PRN
  Administered 2018-06-26: 08:00:00 via INTRAVENOUS

## 2018-06-26 MED ORDER — SODIUM CHLORIDE 0.9 % IR SOLN
Status: DC | PRN
  Administered 2018-06-26: 1000 mL

## 2018-06-26 MED ORDER — FENTANYL CITRATE (PF) 100 MCG/2ML IJ SOLN
INTRAMUSCULAR | Status: DC | PRN
  Administered 2018-06-26: 50 ug via INTRAVENOUS
  Administered 2018-06-26 (×2): 25 ug via INTRAVENOUS

## 2018-06-26 MED ORDER — FENTANYL CITRATE (PF) 250 MCG/5ML IJ SOLN
INTRAMUSCULAR | Status: AC
  Filled 2018-06-26: qty 5

## 2018-06-26 MED ORDER — DEXAMETHASONE SODIUM PHOSPHATE 10 MG/ML IJ SOLN
INTRAMUSCULAR | Status: AC
  Filled 2018-06-26: qty 1

## 2018-06-26 MED ORDER — LABETALOL HCL 5 MG/ML IV SOLN
INTRAVENOUS | Status: AC
  Administered 2018-06-26: 10 mg via INTRAVENOUS
  Filled 2018-06-26: qty 4

## 2018-06-26 MED ORDER — SUGAMMADEX SODIUM 500 MG/5ML IV SOLN
INTRAVENOUS | Status: DC | PRN
  Administered 2018-06-26: 300 mg via INTRAVENOUS

## 2018-06-26 MED ORDER — TRAMADOL HCL 50 MG PO TABS: 50.0000 mg | ORAL_TABLET | Freq: Four times a day (QID) | ORAL | Status: DC | PRN

## 2018-06-26 MED ORDER — IOPAMIDOL (ISOVUE-300) INJECTION 61%
INTRAVENOUS | Status: AC
  Filled 2018-06-26: qty 50

## 2018-06-26 MED ORDER — HYDROCORTISONE NA SUCCINATE PF 100 MG IJ SOLR: 50.0000 mg | Freq: Two times a day (BID) | INTRAMUSCULAR | Status: DC

## 2018-06-26 MED ORDER — ROCURONIUM BROMIDE 50 MG/5ML IV SOSY
PREFILLED_SYRINGE | INTRAVENOUS | Status: AC
  Filled 2018-06-26: qty 5

## 2018-06-26 MED ORDER — FENTANYL CITRATE (PF) 100 MCG/2ML IJ SOLN
INTRAMUSCULAR | Status: AC
  Administered 2018-06-26: 25 ug via INTRAVENOUS
  Filled 2018-06-26: qty 2

## 2018-06-26 MED ORDER — PHENYLEPHRINE 40 MCG/ML (10ML) SYRINGE FOR IV PUSH (FOR BLOOD PRESSURE SUPPORT)
PREFILLED_SYRINGE | INTRAVENOUS | Status: AC
  Filled 2018-06-26: qty 10

## 2018-06-26 MED ORDER — LIDOCAINE 2% (20 MG/ML) 5 ML SYRINGE
INTRAMUSCULAR | Status: AC
  Filled 2018-06-26: qty 5

## 2018-06-26 MED ORDER — BUPIVACAINE-EPINEPHRINE 0.25% -1:200000 IJ SOLN
INTRAMUSCULAR | Status: DC | PRN
  Administered 2018-06-26: 20 mL

## 2018-06-26 SURGICAL SUPPLY — 38 items
ADH SKN CLS APL DERMABOND .7 (GAUZE/BANDAGES/DRESSINGS) ×1
APPLIER CLIP 5 13 M/L LIGAMAX5 (MISCELLANEOUS) ×2
APR CLP MED LRG 5 ANG JAW (MISCELLANEOUS) ×1
BAG SPEC RTRVL LRG 6X4 10 (ENDOMECHANICALS) ×1
BLADE CLIPPER SURG (BLADE) IMPLANT
CANISTER SUCT 3000ML PPV (MISCELLANEOUS) ×2 IMPLANT
CHLORAPREP W/TINT 26ML (MISCELLANEOUS) ×2 IMPLANT
CLIP APPLIE 5 13 M/L LIGAMAX5 (MISCELLANEOUS) ×1 IMPLANT
COVER MAYO STAND STRL (DRAPES) IMPLANT
COVER SURGICAL LIGHT HANDLE (MISCELLANEOUS) ×2 IMPLANT
DERMABOND ADVANCED (GAUZE/BANDAGES/DRESSINGS) ×1
DERMABOND ADVANCED .7 DNX12 (GAUZE/BANDAGES/DRESSINGS) ×1 IMPLANT
DRAPE C-ARM 42X72 X-RAY (DRAPES) IMPLANT
ELECT REM PT RETURN 9FT ADLT (ELECTROSURGICAL) ×2
ELECTRODE REM PT RTRN 9FT ADLT (ELECTROSURGICAL) ×1 IMPLANT
GLOVE SURG SIGNA 7.5 PF LTX (GLOVE) ×2 IMPLANT
GOWN STRL REUS W/ TWL LRG LVL3 (GOWN DISPOSABLE) ×2 IMPLANT
GOWN STRL REUS W/ TWL XL LVL3 (GOWN DISPOSABLE) ×1 IMPLANT
GOWN STRL REUS W/TWL LRG LVL3 (GOWN DISPOSABLE) ×2
GOWN STRL REUS W/TWL XL LVL3 (GOWN DISPOSABLE) ×2
KIT BASIN OR (CUSTOM PROCEDURE TRAY) ×2 IMPLANT
KIT TURNOVER KIT B (KITS) ×2 IMPLANT
NS IRRIG 1000ML POUR BTL (IV SOLUTION) ×2 IMPLANT
PAD ARMBOARD 7.5X6 YLW CONV (MISCELLANEOUS) ×2 IMPLANT
POUCH SPECIMEN RETRIEVAL 10MM (ENDOMECHANICALS) ×2 IMPLANT
SCISSORS LAP 5X35 DISP (ENDOMECHANICALS) ×2 IMPLANT
SET CHOLANGIOGRAPH 5 50 .035 (SET/KITS/TRAYS/PACK) IMPLANT
SET IRRIG TUBING LAPAROSCOPIC (IRRIGATION / IRRIGATOR) ×2 IMPLANT
SLEEVE ENDOPATH XCEL 5M (ENDOMECHANICALS) ×4 IMPLANT
SPECIMEN JAR SMALL (MISCELLANEOUS) ×2 IMPLANT
SUT MNCRL AB 4-0 PS2 18 (SUTURE) ×2 IMPLANT
TOWEL OR 17X24 6PK STRL BLUE (TOWEL DISPOSABLE) ×2 IMPLANT
TOWEL OR 17X26 10 PK STRL BLUE (TOWEL DISPOSABLE) ×2 IMPLANT
TRAY LAPAROSCOPIC MC (CUSTOM PROCEDURE TRAY) ×2 IMPLANT
TROCAR XCEL BLUNT TIP 100MML (ENDOMECHANICALS) ×2 IMPLANT
TROCAR XCEL NON-BLD 5MMX100MML (ENDOMECHANICALS) ×2 IMPLANT
TUBING INSUFFLATION (TUBING) ×2 IMPLANT
WATER STERILE IRR 1000ML POUR (IV SOLUTION) ×2 IMPLANT

## 2018-06-26 NOTE — Progress Notes (Signed)
Pt left for surgery at 7.20am, RN did assessment before pt is leaving to Surgery. Tele removed and informed to CCMD, pt's family members aware  Lonia Farber, RN

## 2018-06-26 NOTE — Progress Notes (Signed)
[facility] PROGRESS NOTE  [name] [name] XBM:[id number] DOB: [birth year] DOA: 06/22/2018 PCP: Briscoe Deutscher, DO   Narrative:  84 year old female CKD stage III 2/2 DM TY 2 Dilated cardiomyopathy EF 20-30%-recent increase in Lasix dose HTN Rheumatoid arthritis on prednisone previously on Humira Xeljanz Bactrim Orencia--seen by Dr. Eber Hong 1 week prior to this admit no changes to medications other than resumption of methotrexate Chronic lower extremity wounds with PAD in the past by vascular surgery ABIs 11/2017 Prior CVA  Last admitted 05/2018 with new onset heart failure cath at that time showed moderately elevated LVEDP and moderate pulmonary hypertension CO 5.68/CI 2.9-- A small PFO was noted with left-to-right shunt.  Return to hospital 8/14 with abdominal pain, nausea, vomiting  alk phos 240 lipase 150 AST/ALT 119/203 direct bili 2.0 BNP 1146 troponin 0 0.03-MRCP confirm any other findings   A & Plan ?  TIA-ruled out by stroke work-up CT MRI 8/16 was negative-do not think this was a stroke  Cholecystitis-this post surgery 8/18 and appears well-continue perioperative aspirin-further care per surgery in terms of weightbearing precautions diet etc.-may be able to discharge as early as a.m.?  Rheumatoid arthritis significant osteoarthritis component-give Solu-Cortef q. 12 from 8/19 and hold methotrexate for 1 week-needs outpatient follow-up with Dr.Thapa observe when to resume the same-family is aware  Dilated cardiomyopathy/nonischemic per cath last month- continue Coreg 25 twice daily, hydralazine 100 3 times daily, Imdur 30 daily Losartan dose 100-->75 per cardiology It appears Aldactone has been discontinued? Continue Lasix 40 I/O -1.6 so far, weight 81-->76  Mild hypokalemia-replaced with K. Dur-potassium 3.6 recheck a.m.  Diabetes mellitus-Metformin 500 daily on hold continue SSI-sugars 1 43-200 range-continue gabapentin  Probable anemia chronic disease continue  ferrous sulfate twice daily  Pulmonary hypertension-as above  Gout continue allopurinol 100 daily (not 200)  Probable carpal tunnel       DVT prophylaxis: Lovenox code Status: Full code family Communication: Daughter disposition Plan: Needs skilled facility placement as early as 8/19 if all stable-social worker is aware and family is agreeable   Verlon Au, MD  [facility] Direct contact: [phone] --Via [place]  --[email or web address]; password [email or web address]  7PM-7AM contact night coverage as above 06/26/2018, 1:46 PM  LOS: 3 days   Consultants:  Cardiology  General surgery  Neurology  Procedures:  CT head/MRI brain 8/16/echocardiogram 8/16  MRCP abdomen  Ultrasound abdomen  Laparoscopic cholecystectomy 8/18  Antimicrobials:  Zosyn  Interval history/Subjective:  Back from surgery no new issues Some mild throat pain and abdominal pain otherwise fine  Objective:  Vitals:  Vitals:   06/26/18 1100 06/26/18 1155  BP: (!) 180/100 (!) 166/86  Pulse:  72  Resp:  20  Temp:  97.7 F (36.5 C)  SpO2:  100%    Exam:  Awake pleasant smiling no distress EOMI NCAT Chest is clear On oxygen at this time Postop scars noted No lower extremity edema  I have personally reviewed the following:   Labs:  Potassium 3.6 BUN/creatinine 15/1.1 ALT 80 AST 24  Imaging studies:  CT scan of head 8/16 shows no stroke  MRI brain shows no stroke  Echocardiogram 30 25%, mild LV hypertrophy mild MR borderline pulmonary hypertension  Medical tests:  No  Test discussed with performing physician:  No  Decision to obtain old records:  Y  Review and summation of old records:  N  Scheduled Meds: . allopurinol  100 mg Oral Daily  . carvedilol  25 mg Oral BID WC  . furosemide  40  mg Oral Daily  . gabapentin  100 mg Oral QHS  . hydrALAZINE  100 mg Oral TID  . hydrocortisone sod succinate (SOLU-CORTEF) inj  50 mg Intravenous Q8H  . insulin aspart  0-9 Units  Subcutaneous TID WC  . isosorbide mononitrate  30 mg Oral Daily  . [START ON 06/27/2018] losartan  75 mg Oral Daily  . potassium chloride  40 mEq Oral Daily   Continuous Infusions:   Active Problems:   Rheumatoid arthritis involving multiple sites ([place])   Anemia of chronic disease   CKD stage 3 due to type 2 diabetes mellitus (HCC)   DM (diabetes mellitus), type 2 with renal complications (HCC)   Acute combined systolic and diastolic heart failure (HCC)   DCM (dilated cardiomyopathy) (HCC)   Uncontrolled hypertension   Abdominal pain   Cholelithiasis   Elevated LFTs   Acute pulmonary edema (HCC)   Cholecystitis   Cerebral thrombosis with cerebral infarction   LOS: 3 days

## 2018-06-26 NOTE — Anesthesia Procedure Notes (Signed)
Procedure Name: Intubation Date/Time: 06/26/2018 8:11 AM Performed by: Neldon Newport, CRNA Pre-anesthesia Checklist: Patient identified, Emergency Drugs available, Suction available and Patient being monitored Patient Re-evaluated:Patient Re-evaluated prior to induction Oxygen Delivery Method: Circle System Utilized Preoxygenation: Pre-oxygenation with 100% oxygen Induction Type: IV induction Ventilation: Mask ventilation without difficulty Laryngoscope Size: Mac and 3 Grade View: Grade I Tube type: Oral Tube size: 7.0 mm Number of attempts: 1 Airway Equipment and Method: Stylet and Oral airway Placement Confirmation: ETT inserted through vocal cords under direct vision,  positive ETCO2 and breath sounds checked- equal and bilateral Tube secured with: Tape Dental Injury: Teeth and Oropharynx as per pre-operative assessment

## 2018-06-26 NOTE — Transfer of Care (Signed)
Immediate Anesthesia Transfer of Care Note  Patient: [name] [name]  Procedure(s) Performed: LAPAROSCOPIC CHOLECYSTECTOMY POSSIBLE INTRAOPERATIVE CHOLANGIOGRAM (N/A Abdomen)  Patient Location: PACU  Anesthesia Type:General  Level of Consciousness: awake, alert  and oriented  Airway & Oxygen Therapy: Patient Spontanous Breathing and Patient connected to nasal cannula oxygen  Post-op Assessment: Report given to RN, Post -op Vital signs reviewed and stable and Patient moving all extremities X 4  Post vital signs: Reviewed and stable  Last Vitals:  Vitals Value Taken Time  BP 180/107 06/26/2018  8:58 AM  Temp    Pulse 82 06/26/2018  8:59 AM  Resp 25 06/26/2018  8:59 AM  SpO2 100 % 06/26/2018  8:59 AM  Vitals shown include unvalidated device data.  Last Pain:  Vitals:   06/26/18 0715  TempSrc:   PainSc: 0-No pain      Patients Stated Pain Goal: 0 (06/23/18 0900)  Complications: No apparent anesthesia complications

## 2018-06-26 NOTE — Progress Notes (Signed)
SLP Cancellation Note  Patient Details Name: [name] [name] MRN: [id number] DOB: 1930/09/30   Cancelled treatment:        Pt off floor for surgery. Will follow up next date.  Rondel Baton, [place], CCC-SLP Speech-Language Pathologist [phone]                                                                                                 Arlana Lindau 06/26/2018, 8:22 AM

## 2018-06-26 NOTE — Progress Notes (Signed)
Pt doing good so far after her surgery this am, getting out of bed and using BSC, oxygen weaned earlier, Spirometer provided and taught how to use, cepacol helped her little spell of cough this am, tolerating applesauce and juice, will advance her diet to soft as she agreed with this, recent BP is 160/80, Daughter is in bed side and is updating, denies pain and SOB. According to the patient, "I am doing wonderful, God is good"  Will continue to monitor  Lonia Farber, RN

## 2018-06-26 NOTE — Progress Notes (Signed)
Progress Note  Patient Name: [name] [name] Date of Encounter: 06/26/2018  Primary Cardiologist: Chilton Si, MD   Subjective   The patient is resting comfortably after laparoscopic cholecystectomy this morning.  She denies chest pain and shortness of breath.  Family members are present.  She is hypertensive.  Inpatient Medications    Scheduled Meds: . allopurinol  100 mg Oral Daily  . carvedilol  25 mg Oral BID WC  . furosemide  40 mg Oral Daily  . gabapentin  100 mg Oral QHS  . hydrALAZINE  100 mg Oral TID  . hydrocortisone sod succinate (SOLU-CORTEF) inj  50 mg Intravenous Q8H  . insulin aspart  0-9 Units Subcutaneous TID WC  . isosorbide mononitrate  30 mg Oral Daily  . losartan  50 mg Oral Daily  . potassium chloride  40 mEq Oral Daily   Continuous Infusions: . piperacillin-tazobactam (ZOSYN)  IV 3.375 g (06/26/18 0502)   PRN Meds: acetaminophen **OR** acetaminophen, albuterol, hydrALAZINE, ondansetron **OR** ondansetron (ZOFRAN) IV, traMADol   Vital Signs    Vitals:   06/26/18 0940 06/26/18 0950 06/26/18 1009 06/26/18 1017  BP: (!) 186/93  (!) 180/105 (!) 190/94  Pulse: 72 70 71 71  Resp: 12 16 18 18   Temp:  (!) 97.3 F (36.3 C)  (!) 97.3 F (36.3 C)  TempSrc:    Oral  SpO2: 99% 100% 100% 100%  Weight:      Height:        Intake/Output Summary (Last 24 hours) at 06/26/2018 1057 Last data filed at 06/26/2018 0900 Gross per 24 hour  Intake 1468 ml  Output 805 ml  Net 663 ml   Filed Weights   06/24/18 0537 06/25/18 0301 06/26/18 0512  Weight: 81.3 kg 77.8 kg 76.8 kg    Telemetry    Sinus rhythm with PVCs occasionally in trigeminy- Personally Reviewed  ECG    No new tracings- Personally Reviewed  Physical Exam   GEN: No acute distress.   Neck: No JVD Cardiac: RRR, no murmurs, rubs, or gallops.  Respiratory: Clear to auscultation bilaterally. GI: Soft  MS: No edema; No deformity. Neuro:  Nonfocal  Psych: Normal affect   Labs      Chemistry Recent Labs  Lab 06/24/18 0524 06/25/18 0607 06/26/18 0445  NA 137 138 140  K 3.5 3.1* 3.6  CL 100 101 105  CO2 27 25 27   GLUCOSE 159* 144* 143*  BUN 17 14 15   CREATININE 1.21* 1.15* 1.11*  CALCIUM 8.9 9.0 9.0  PROT 6.2* 6.6 6.5  ALBUMIN 3.0* 3.0* 3.0*  AST 43* 27 24  ALT 140* 104* 80*  ALKPHOS 225* 184* 161*  BILITOT 0.7 0.9 0.9  GFRNONAA 39* 42* 44*  GFRAA 46* 48* 51*  ANIONGAP 10 12 8      Hematology Recent Labs  Lab 06/24/18 0524 06/25/18 0607 06/26/18 0445  WBC 5.9 7.4 8.0  RBC 3.38* 3.75* 3.82*  HGB 10.1* 11.0* 11.1*  HCT 30.3* 33.6* 34.2*  MCV 89.6 89.6 89.5  MCH 29.9 29.3 29.1  MCHC 33.3 32.7 32.5  RDW 17.0* 17.3* 17.6*  PLT 266 290 263    Cardiac Enzymes Recent Labs  Lab 06/22/18 2153 06/23/18 0500 06/23/18 1015 06/23/18 1628  TROPONINI <0.03 0.03* 0.03* 0.03*   No results for input(s): TROPIPOC in the last 168 hours.   BNP Recent Labs  Lab 06/22/18 2153  BNP 1,146.7*     DDimer No results for input(s): DDIMER in the last 168 hours.  Radiology    Mr Maxine Glenn Head Wo Contrast  Result Date: 06/24/2018 CLINICAL DATA:  Stroke follow-up EXAM: MRA HEAD WITHOUT CONTRAST TECHNIQUE: Angiographic images of the Circle of Willis were obtained using MRA technique without intravenous contrast. COMPARISON:  Brain MRI 06/24/2018 FINDINGS: ANTERIOR CIRCULATION: --Intracranial internal carotid arteries: Normal. --Anterior cerebral arteries: Normal. Both A1 segments are present. Patent anterior communicating artery. --Middle cerebral arteries: Normal. --Posterior communicating arteries: Present on the right POSTERIOR CIRCULATION: --Basilar artery: Normal. --Posterior cerebral arteries: Normal. --Superior cerebellar arteries: Normal. --Inferior cerebellar arteries: Normal anterior and posterior inferior cerebellar arteries. IMPRESSION: Normal intracranial MRA. Electronically Signed   By: Deatra Robinson [name]   On: 06/24/2018 15:39    Cardiac Studies    Echocardiogram 06/24/2018:  Study Conclusions  - Left ventricle: The cavity size was normal. Wall thickness was increased in a pattern of mild LVH. Systolic function was severely reduced. The estimated ejection fraction was in the range of 25% to 30%. Diffuse hypokinesis. Doppler parameters are consistent with abnormal left ventricular relaxation (grade 1 diastolic dysfunction). - Aortic valve: There was no stenosis. There was trivial regurgitation. - Mitral valve: Mildly calcified annulus. There was mild regurgitation. - Left atrium: The atrium was moderately dilated. - Right ventricle: The cavity size was normal. Systolic function was normal. - Atrial septum: Cannot rule out small PFO. - Pulmonary arteries: PA peak pressure: 35 mm Hg (S). - Inferior vena cava: The vessel was normal in size. The respirophasic diameter changes were in the normal range (>= 50%), consistent with normal central venous pressure. - Pericardium, extracardiac: A trivial pericardial effusion was identified.  Impressions:  - Normal LV size with mild LV hypertrophy. EF 25-30% with diffuse hypokinesis. Normal RV size and systolic function. Mild MR. Borderline pulmonary hypertension.  Patient Profile     84 y.o. female with non-obstructive CAD, hypertension, rheumatoid arthritis on methotrexate, prednisone, and leflunomide, moderate pulmonary hypertension, chronic systolic and diastolic heart failure, diabetes, PAD, and prior stroke here with gallstone pancreatitis.  Assessment & Plan    1.  Acute on chronic systolic and diastolic heart failure: She appears euvolemic.  Lasix 40 mg p.o. daily was resumed 8/17.  Losartan was also resumed on 8/17 at a lower dose of 50 mg daily.  She is also on carvedilol, Imdur, and hydralazine.  She takes spironolactone at home but this has not been resumed.  She remains hypertensive this morning.  I will increase losartan to 75 mg.   2.   Hypertension: She is hypertensive this morning.    I will increase the dose of losartan to 75 mg.  3.  History of CVA: Head CT negative for acute changes.  Aspirin had been on hold in preparation for surgery.  This can be resumed when deemed feasible by surgery, hopefully tomorrow.  4.  Cholecystitis: She underwent laparoscopic cholecystectomy this morning.     [facility] will sign off.   Medication Recommendations:  I am increasing losartan to 75 mg for better blood pressure control.  Resume aspirin when deemed feasible. Other recommendations (labs, testing, etc): None Follow up as an outpatient: With Dr. Duke Salvia  For questions or updates, please contact [facility] Please consult [email or web address] for contact info under Cardiology/STEMI.      Signed, Prentice Docker, MD  06/26/2018, 10:57 AM

## 2018-06-26 NOTE — Progress Notes (Signed)
Pharmacy Antibiotic Note  [name] [name] is a 84 y.o. female admitted on 06/22/2018 with intra-abdominal infection.  Pharmacy has been consulted for Zosyn dosing. WBC WNL, afebrile, CrCl ~36.5.   Plan:  Continue Zosyn 3.375G IV q8h Consider length of therapy Trend WBC, temp, renal function    Height: 5\' 4"  (162.6 cm) Weight: 169 lb 6.4 oz (76.8 kg)(scale c) IBW/kg (Calculated) : 54.7  Temp (24hrs), Avg:97.7 F (36.5 C), Min:97.3 F (36.3 C), Max:98.2 F (36.8 C)  Recent Labs  Lab 06/22/18 2153 06/23/18 0500 06/24/18 0524 06/25/18 0607 06/26/18 0445  WBC 7.7 8.3 5.9 7.4 8.0  CREATININE 1.16* 1.18* 1.21* 1.15* 1.11*    Estimated Creatinine Clearance: 36.5 mL/min (A) (by C-G formula based on SCr of 1.11 mg/dL (H)).    Allergies  Allergen Reactions  . Clonidine Derivatives Swelling    [name]  . Fish Allergy Diarrhea, Swelling and Other (See Comments)    Turns skin "black," but can tolerate white fish Salmon- Diarrhea  . Shellfish Allergy Hives  . Doxycycline Rash  . Indomethacin Other (See Comments)    Reaction not recalled by the patient  . Lyrica [Pregabalin] Other (See Comments)    Hallucinations   . Methyldopa Other (See Comments)    Aldomet (for hypertension): Reaction not recalled by the patient  . Orange Fruit [Citrus] Other (See Comments)    Indigestion/heartburn  . Cetirizine Hcl Itching and Rash  . Codeine Itching  . Levaquin [Levofloxacin In D5w] Rash  . Tomato Rash   Thank you for involving pharmacy in this patient's care.  06/28/18, PharmD PGY1 Pharmacy Resident Phone: [phone] 06/26/2018 10:57 AM

## 2018-06-26 NOTE — Progress Notes (Signed)
Pt came back from surgery, BP running in a higher side (180/105), surgical site looks CDI, Family members in bed side and is updating,Tele started back, will continue to monitor the patient  Lonia Farber, Charity fundraiser

## 2018-06-26 NOTE — Anesthesia Preprocedure Evaluation (Addendum)
Anesthesia Evaluation  Patient identified by MRN, date of birth, ID band Patient awake    Reviewed: Allergy & Precautions, NPO status , [name] Chart, lab work & pertinent test results, reviewed documented beta blocker date and time   Airway Mallampati: I  TM Distance: >3 FB Neck ROM: Full    Dental  (+) Teeth Intact, Missing,    Pulmonary    breath sounds clear to auscultation       Cardiovascular hypertension, Pt. on home beta blockers + CAD, + Past MI, + Peripheral Vascular Disease and +CHF  + dysrhythmias  Rhythm:Regular Rate:Normal     Neuro/Psych Seizures -,  TIA Neuromuscular disease CVA    GI/Hepatic   Endo/Other  diabetes, Type 2, Oral Hypoglycemic Agents  Renal/GU CRFRenal disease     Musculoskeletal  (+) Arthritis , Rheumatoid disorders,  Fibromyalgia -  Abdominal (+) + obese,   Peds  Hematology   Anesthesia Other Findings - HLD  Reproductive/Obstetrics                            Lab Results  Component Value Date   WBC 8.0 06/26/2018   HGB 11.1 (L) 06/26/2018   HCT 34.2 (L) 06/26/2018   MCV 89.5 06/26/2018   PLT 263 06/26/2018   Lab Results  Component Value Date   CREATININE 1.11 (H) 06/26/2018   BUN 15 06/26/2018   NA 140 06/26/2018   K 3.6 06/26/2018   CL 105 06/26/2018   CO2 27 06/26/2018   Lab Results  Component Value Date   INR 1.16 06/01/2018   INR 0.96 07/28/2013   Echo: - Left ventricle: The cavity size was normal. Wall thickness was   increased in a pattern of mild LVH. Systolic function was   severely reduced. The estimated ejection fraction was in the   range of 25% to 30%. Diffuse hypokinesis. Doppler parameters are   consistent with abnormal left ventricular relaxation (grade 1   diastolic dysfunction). - Aortic valve: There was no stenosis. There was trivial   regurgitation. - Mitral valve: Mildly calcified annulus. There was mild  regurgitation. - Left atrium: The atrium was moderately dilated. - Right ventricle: The cavity size was normal. Systolic function   was normal. - Atrial septum: Cannot rule out small PFO. - Pulmonary arteries: PA peak pressure: 35 mm Hg (S). - Inferior vena cava: The vessel was normal in size. The   respirophasic diameter changes were in the normal range (>= 50%),   consistent with normal central venous pressure. - Pericardium, extracardiac: A trivial pericardial effusion was   identified.  EKG: normal sinus rhythm.   Anesthesia Physical Anesthesia Plan  ASA: IV  Anesthesia Plan: General   Post-op Pain Management:    Induction: Intravenous  PONV Risk Score and Plan: 4 or greater and Ondansetron and Treatment may vary due to age or medical condition  Airway Management Planned: Oral ETT  Additional Equipment: None  Intra-op Plan:   Post-operative Plan: Extubation in OR  Informed Consent: I have reviewed the patients History and Physical, chart, labs and discussed the procedure including the risks, benefits and alternatives for the proposed anesthesia with the patient or authorized representative who has indicated his/her understanding and acceptance.   Dental advisory given  Plan Discussed with: CRNA  Anesthesia Plan Comments:        Anesthesia Quick Evaluation

## 2018-06-26 NOTE — Anesthesia Postprocedure Evaluation (Signed)
Anesthesia Post Note  Patient: [name] [name]  Procedure(s) Performed: LAPAROSCOPIC CHOLECYSTECTOMY POSSIBLE INTRAOPERATIVE CHOLANGIOGRAM (N/A Abdomen)     Patient location during evaluation: PACU Anesthesia Type: General Level of consciousness: awake and alert Pain management: pain level controlled Vital Signs Assessment: post-procedure vital signs reviewed and stable Respiratory status: spontaneous breathing, nonlabored ventilation, respiratory function stable and patient connected to nasal cannula oxygen Cardiovascular status: blood pressure returned to baseline and stable Postop Assessment: no apparent nausea or vomiting Anesthetic complications: no    Last Vitals:  Vitals:   06/26/18 1009 06/26/18 1017  BP: (!) 180/105 (!) 190/94  Pulse: 71 71  Resp: 18   Temp:  (!) 36.3 C  SpO2:      Last Pain:  Vitals:   06/26/18 1017  TempSrc: Oral  PainSc:                  Shelton Silvas

## 2018-06-26 NOTE — Op Note (Signed)
Laparoscopic Cholecystectomy Procedure Note  Indications: This patient presents with symptomatic gallbladder disease and will undergo laparoscopic cholecystectomy.  Pre-operative Diagnosis: symptomatic cholelithiasis  Post-operative Diagnosis: Same  Surgeon: Abigail Miyamoto A   Assistants: 0  Anesthesia: General endotracheal anesthesia  ASA Class: 3  Procedure Details  The patient was seen again in the Holding Room. The risks, benefits, complications, treatment options, and expected outcomes were discussed with the patient. The possibilities of reaction to medication, pulmonary aspiration, perforation of viscus, bleeding, recurrent infection, finding a normal gallbladder, the need for additional procedures, failure to diagnose a condition, the possible need to convert to an open procedure, and creating a complication requiring transfusion or operation were discussed with the patient. The likelihood of improving the patient's symptoms with return to their baseline status is good.  The patient and/or family concurred with the proposed plan, giving informed consent. The site of surgery properly noted. The patient was taken to Operating Room, identified as [name] [name] and the procedure verified as Laparoscopic Cholecystectomy with Intraoperative Cholangiogram. A Time Out was held and the above information confirmed.  Prior to the induction of general anesthesia, antibiotic prophylaxis was administered. General endotracheal anesthesia was then administered and tolerated well. After the induction, the abdomen was prepped with Chloraprep and draped in sterile fashion. The patient was positioned in the supine position.  Local anesthetic agent was injected into the skin near the umbilicus and an incision made. We dissected down to the abdominal fascia with blunt dissection.  The fascia was incised vertically and we entered the peritoneal cavity bluntly.  A pursestring suture of 0-Vicryl was placed  around the fascial opening.  The Hasson cannula was inserted and secured with the stay suture.  Pneumoperitoneum was then created with CO2 and tolerated well without any adverse changes in the patient's vital signs. A 5-mm port was placed in the subxiphoid position.  Two 5-mm ports were placed in the right upper quadrant. All skin incisions were infiltrated with a local anesthetic agent before making the incision and placing the trocars.   We positioned the patient in reverse Trendelenburg, tilted slightly to the patient's left.  The gallbladder was identified, the fundus grasped and retracted cephalad. Adhesions were lysed bluntly and with the electrocautery where indicated, taking care not to injure any adjacent organs or viscus. The infundibulum was grasped and retracted laterally, exposing the peritoneum overlying the triangle of Calot. This was then divided and exposed in a blunt fashion. The cystic duct was clearly identified and bluntly dissected circumferentially. A critical view of the cystic duct and cystic artery was obtained.  The cystic duct was then ligated with clips and divided. The cystic artery was, dissected free, ligated with clips and divided as well.   The gallbladder was dissected from the liver bed in retrograde fashion with the electrocautery. The gallbladder was removed and placed in an Endocatch sac. The liver bed was irrigated and inspected. Hemostasis was achieved with the electrocautery. Copious irrigation was utilized and was repeatedly aspirated until clear.  The gallbladder and Endocatch sac were then removed through the umbilical port site.  The pursestring suture was used to close the umbilical fascia.    We again inspected the right upper quadrant for hemostasis.  Pneumoperitoneum was released as we removed the trocars.  4-0 Monocryl was used to close the skin. Skin glue was then applied. The patient was then extubated and brought to the recovery room in stable condition.  Instrument, sponge, and needle counts were correct at  closure and at the conclusion of the case.   Findings: Mild Cholecystitis with Cholelithiasis  Estimated Blood Loss: Minimal         Drains: 0         Specimens: Gallbladder           Complications: None; patient tolerated the procedure well.         Disposition: PACU - hemodynamically stable.         Condition: stable

## 2018-06-26 NOTE — Progress Notes (Signed)
Patient ID: [name] [name], female   DOB: May 15, 1930, 84 y.o.   MRN: [id number]  I discussed the procedure in detail.  The patient was given Agricultural engineer.  We discussed the risks and benefits of a laparoscopic cholecystectomy and possible cholangiogram including, but not limited to bleeding, infection, injury to surrounding structures such as the intestine or liver, bile leak, retained gallstones, need to convert to an open procedure, prolonged diarrhea, blood clots such as  DVT, common bile duct injury, anesthesia risks, and possible need for additional procedures.  The likelihood of improvement in symptoms and return to the patient's normal status is good. We discussed the typical post-operative recovery course.

## 2018-06-27 ENCOUNTER — Encounter (HOSPITAL_COMMUNITY): Payer: Self-pay | Admitting: Surgery

## 2018-06-27 DIAGNOSIS — R279 Unspecified lack of coordination: Secondary | ICD-10-CM | POA: Diagnosis not present

## 2018-06-27 DIAGNOSIS — M109 Gout, unspecified: Secondary | ICD-10-CM | POA: Diagnosis not present

## 2018-06-27 DIAGNOSIS — I5042 Chronic combined systolic (congestive) and diastolic (congestive) heart failure: Secondary | ICD-10-CM | POA: Diagnosis not present

## 2018-06-27 DIAGNOSIS — D630 Anemia in neoplastic disease: Secondary | ICD-10-CM | POA: Diagnosis not present

## 2018-06-27 DIAGNOSIS — J810 Acute pulmonary edema: Secondary | ICD-10-CM | POA: Diagnosis not present

## 2018-06-27 DIAGNOSIS — R262 Difficulty in walking, not elsewhere classified: Secondary | ICD-10-CM | POA: Diagnosis not present

## 2018-06-27 DIAGNOSIS — M150 Primary generalized (osteo)arthritis: Secondary | ICD-10-CM | POA: Diagnosis not present

## 2018-06-27 DIAGNOSIS — Z48815 Encounter for surgical aftercare following surgery on the digestive system: Secondary | ICD-10-CM | POA: Diagnosis not present

## 2018-06-27 DIAGNOSIS — E1122 Type 2 diabetes mellitus with diabetic chronic kidney disease: Secondary | ICD-10-CM | POA: Diagnosis not present

## 2018-06-27 DIAGNOSIS — I69928 Other speech and language deficits following unspecified cerebrovascular disease: Secondary | ICD-10-CM | POA: Diagnosis not present

## 2018-06-27 DIAGNOSIS — N183 Chronic kidney disease, stage 3 (moderate): Secondary | ICD-10-CM | POA: Diagnosis not present

## 2018-06-27 DIAGNOSIS — I420 Dilated cardiomyopathy: Secondary | ICD-10-CM | POA: Diagnosis not present

## 2018-06-27 DIAGNOSIS — M069 Rheumatoid arthritis, unspecified: Secondary | ICD-10-CM | POA: Diagnosis not present

## 2018-06-27 DIAGNOSIS — N179 Acute kidney failure, unspecified: Secondary | ICD-10-CM | POA: Diagnosis not present

## 2018-06-27 DIAGNOSIS — I129 Hypertensive chronic kidney disease with stage 1 through stage 4 chronic kidney disease, or unspecified chronic kidney disease: Secondary | ICD-10-CM | POA: Diagnosis not present

## 2018-06-27 DIAGNOSIS — K8000 Calculus of gallbladder with acute cholecystitis without obstruction: Secondary | ICD-10-CM | POA: Diagnosis not present

## 2018-06-27 DIAGNOSIS — R531 Weakness: Secondary | ICD-10-CM | POA: Diagnosis not present

## 2018-06-27 DIAGNOSIS — I2510 Atherosclerotic heart disease of native coronary artery without angina pectoris: Secondary | ICD-10-CM | POA: Diagnosis not present

## 2018-06-27 DIAGNOSIS — M0579 Rheumatoid arthritis with rheumatoid factor of multiple sites without organ or systems involvement: Secondary | ICD-10-CM | POA: Diagnosis not present

## 2018-06-27 DIAGNOSIS — Z743 Need for continuous supervision: Secondary | ICD-10-CM | POA: Diagnosis not present

## 2018-06-27 DIAGNOSIS — E1129 Type 2 diabetes mellitus with other diabetic kidney complication: Secondary | ICD-10-CM | POA: Diagnosis not present

## 2018-06-27 DIAGNOSIS — K819 Cholecystitis, unspecified: Secondary | ICD-10-CM | POA: Diagnosis not present

## 2018-06-27 DIAGNOSIS — I5041 Acute combined systolic (congestive) and diastolic (congestive) heart failure: Secondary | ICD-10-CM | POA: Diagnosis not present

## 2018-06-27 DIAGNOSIS — I160 Hypertensive urgency: Secondary | ICD-10-CM | POA: Diagnosis not present

## 2018-06-27 DIAGNOSIS — M6281 Muscle weakness (generalized): Secondary | ICD-10-CM | POA: Diagnosis not present

## 2018-06-27 DIAGNOSIS — D631 Anemia in chronic kidney disease: Secondary | ICD-10-CM | POA: Diagnosis not present

## 2018-06-27 DIAGNOSIS — E1121 Type 2 diabetes mellitus with diabetic nephropathy: Secondary | ICD-10-CM | POA: Diagnosis not present

## 2018-06-27 LAB — CBC WITH DIFFERENTIAL/PLATELET
Abs Immature Granulocytes: 0.1 10*3/uL (ref 0.0–0.1)
Basophils Absolute: 0 10*3/uL (ref 0.0–0.1)
Basophils Relative: 0 %
Eosinophils Absolute: 0 10*3/uL (ref 0.0–0.7)
Eosinophils Relative: 0 %
HCT: 32.7 % — ABNORMAL LOW (ref 36.0–46.0)
Hemoglobin: 10.5 g/dL — ABNORMAL LOW (ref 12.0–15.0)
Immature Granulocytes: 1 %
Lymphocytes Relative: 18 %
Lymphs Abs: 2.2 10*3/uL (ref 0.7–4.0)
MCH: 29.6 pg (ref 26.0–34.0)
MCHC: 32.1 g/dL (ref 30.0–36.0)
MCV: 92.1 fL (ref 78.0–100.0)
Monocytes Absolute: 0.8 10*3/uL (ref 0.1–1.0)
Monocytes Relative: 7 %
Neutro Abs: 8.9 10*3/uL — ABNORMAL HIGH (ref 1.7–7.7)
Neutrophils Relative %: 74 %
Platelets: 237 10*3/uL (ref 150–400)
RBC: 3.55 MIL/uL — ABNORMAL LOW (ref 3.87–5.11)
RDW: 17.7 % — ABNORMAL HIGH (ref 11.5–15.5)
WBC: 11.9 10*3/uL — ABNORMAL HIGH (ref 4.0–10.5)

## 2018-06-27 LAB — RENAL FUNCTION PANEL
Albumin: 2.9 g/dL — ABNORMAL LOW (ref 3.5–5.0)
Anion gap: 10 (ref 5–15)
BUN: 17 mg/dL (ref 8–23)
CO2: 26 mmol/L (ref 22–32)
Calcium: 8.7 mg/dL — ABNORMAL LOW (ref 8.9–10.3)
Chloride: 104 mmol/L (ref 98–111)
Creatinine, Ser: 1.1 mg/dL — ABNORMAL HIGH (ref 0.44–1.00)
GFR calc Af Amer: 51 mL/min — ABNORMAL LOW (ref >60)
GFR calc non Af Amer: 44 mL/min — ABNORMAL LOW (ref >60)
Glucose, Bld: 108 mg/dL — ABNORMAL HIGH (ref 70–99)
Phosphorus: 2.7 mg/dL (ref 2.5–4.6)
Potassium: 3.5 mmol/L (ref 3.5–5.1)
Sodium: 140 mmol/L (ref 135–145)

## 2018-06-27 LAB — GLUCOSE, CAPILLARY
Glucose-Capillary: 107 mg/dL — ABNORMAL HIGH (ref 70–99)
Glucose-Capillary: 131 mg/dL — ABNORMAL HIGH (ref 70–99)
Glucose-Capillary: 132 mg/dL — ABNORMAL HIGH (ref 70–99)

## 2018-06-27 MED ORDER — LOSARTAN POTASSIUM 25 MG PO TABS: 75.0000 mg | ORAL_TABLET | Freq: Every day | ORAL | 1 refills | Status: DC

## 2018-06-27 MED ORDER — POTASSIUM CHLORIDE CRYS ER 20 MEQ PO TBCR: 40.0000 meq | EXTENDED_RELEASE_TABLET | Freq: Every day | ORAL | Status: DC

## 2018-06-27 MED ORDER — TRAMADOL HCL 50 MG PO TABS: 100.0000 mg | ORAL_TABLET | Freq: Four times a day (QID) | ORAL | 0 refills | Status: DC | PRN

## 2018-06-27 MED ORDER — FUROSEMIDE 40 MG PO TABS: 40.0000 mg | ORAL_TABLET | Freq: Every day | ORAL | 0 refills | Status: DC

## 2018-06-27 MED ORDER — PREDNISONE 10 MG PO TABS
10.0000 mg | ORAL_TABLET | Freq: Every day | ORAL | Status: DC
  Administered 2018-06-27: 10 mg via ORAL
  Filled 2018-06-27: qty 1

## 2018-06-27 NOTE — Progress Notes (Signed)
Patient given applesauce around 3:45 pm and stated she felt nauseated.  Dr. Burnett Harry informed.  I was instructed to follow-up in 30 minutes and see if still nauseated.  Patient given diet gingerale with afternoon meds at present and she denied nausea. Per Dr. Wyatt Haste instructions it is ok for Ms Sitzmann to discharge to SNF if no nausea.  Patient informed she meets criteria for discharge and SW informed and transportation called.  Pt's daughter called and informed patient will be transported to SNF this afternoon - daughter's phone # was [phone].  Will call report to SNF.

## 2018-06-27 NOTE — Clinical Social Work Note (Signed)
CSW facilitated patient discharge including contacting patient family and facility to confirm patient discharge plans. Clinical information faxed to facility and family agreeable with plan. CSW arranged ambulance transport via [facility] to [facility]. RN to call report prior to discharge ([phone]).  CSW will sign off for now as social work intervention is no longer needed. Please consult us again if new needs arise.  [name] [name], CSW [phone]  

## 2018-06-27 NOTE — Progress Notes (Signed)
[facility]/Trauma Progress Note  1 Day Post-Op   Assessment/Plan Active Problems:   Rheumatoid arthritis involving multiple sites (HCC)   Anemia of chronic disease   CKD stage 3 due to type 2 diabetes mellitus (HCC)   DM (diabetes mellitus), type 2 with renal complications (HCC)   Acute combined systolic and diastolic heart failure (HCC)   DCM (dilated cardiomyopathy) (HCC)   Uncontrolled hypertension   Abdominal pain   Cholelithiasis   Elevated LFTs   Acute pulmonary edema (HCC)   Cholecystitis   Cerebral thrombosis with cerebral infarction  Symptomatic Cholelithiasis - S/P lap chole, Dr. Magnus Ivan, 08/18  FEN: soft diet VTE: SCD's, lovenox or heparin okay from surgical standpoint, will defer to medicine ID: Zosyn 08/15-08/18 Follow up: [facility] office 2 weeks  DISPO: If pt tolerates a reg diet she is okay for discharge from a surgical standpoint.    LOS: 4 days    Subjective: CC; abdominal soreness  Pt tolerating CLD, no issues overnight. Daughter at bedside. Pt denies nausea, vomiting, fever or chills. She has been up to the bathroom.   Objective: Vital signs in last 24 hours: Temp:  [97.3 F (36.3 C)-98.5 F (36.9 C)] 98.5 F (36.9 C) (08/19 0510) Pulse Rate:  [68-85] 68 (08/19 0700) Resp:  [9-20] 20 (08/19 0510) BP: (116-190)/(70-163) 159/81 (08/19 0700) SpO2:  [95 %-100 %] 96 % (08/19 0510) Weight:  [78.1 kg] 78.1 kg (08/19 0510) Last BM Date: 06/25/18  Intake/Output from previous day: 08/18 0701 - 08/19 0700 In: 1398 [P.O.:840; I.V.:508] Out: 1205 [Urine:1200; Blood:5] Intake/Output this shift: No intake/output data recorded.  PE: Gen:  Alert, NAD, pleasant, cooperative Pulm:  Rate and effort normal Abd: Soft, ND, +BS, no HSM, incisions with glue intact, appropriately tender Skin: no rashes noted, warm and dry   Anti-infectives: Anti-infectives (From admission, onward)   Start     Dose/Rate Route Frequency Ordered Stop   06/23/18  1000  piperacillin-tazobactam (ZOSYN) IVPB 3.375 g  Status:  Discontinued     3.375 g 12.5 mL/hr over 240 Minutes Intravenous Every 8 hours 06/23/18 0456 06/26/18 1214   06/23/18 0145  piperacillin-tazobactam (ZOSYN) IVPB 3.375 g     3.375 g 100 mL/hr over 30 Minutes Intravenous  Once 06/23/18 0137 06/23/18 0220      Lab Results:  Recent Labs    06/25/18 0607 06/26/18 0445  WBC 7.4 8.0  HGB 11.0* 11.1*  HCT 33.6* 34.2*  PLT 290 263   BMET Recent Labs    06/25/18 0607 06/26/18 0445  NA 138 140  K 3.1* 3.6  CL 101 105  CO2 25 27  GLUCOSE 144* 143*  BUN 14 15  CREATININE 1.15* 1.11*  CALCIUM 9.0 9.0   PT/INR No results for input(s): LABPROT, INR in the last 72 hours. CMP     Component Value Date/Time   NA 140 06/26/2018 0445   NA 138 06/15/2018 1122   K 3.6 06/26/2018 0445   CL 105 06/26/2018 0445   CO2 27 06/26/2018 0445   GLUCOSE 143 (H) 06/26/2018 0445   BUN 15 06/26/2018 0445   BUN 32 (H) 06/15/2018 1122   CREATININE 1.11 (H) 06/26/2018 0445   CREATININE 1.19 (H) 07/21/2017 1454   CALCIUM 9.0 06/26/2018 0445   PROT 6.5 06/26/2018 0445   PROT 7.2 09/09/2015 1210   ALBUMIN 3.0 (L) 06/26/2018 0445   ALBUMIN 3.9 09/09/2015 1210   AST 24 06/26/2018 0445   ALT 80 (H) 06/26/2018 0445   ALKPHOS  161 (H) 06/26/2018 0445   BILITOT 0.9 06/26/2018 0445   BILITOT 0.5 09/09/2015 1210   GFRNONAA 44 (L) 06/26/2018 0445   GFRNONAA 42 (L) 07/21/2017 1454   GFRAA 51 (L) 06/26/2018 0445   GFRAA 48 (L) 07/21/2017 1454   Lipase     Component Value Date/Time   LIPASE 77 (H) 06/23/2018 0500    Studies/Results: No results found.    Jerre Simon , [facility] 06/27/2018, 8:32 AM  Pager: [phone] Mon-Wed, Friday 7:00am-4:30pm Thurs 7am-11:30am  Consults: [phone]

## 2018-06-27 NOTE — Progress Notes (Signed)
Report called to Dewayne Hatch, Nurse at [facility].  Patient confirmed her personal belongings were packed for transport, as follows:   Cell phone, personal books, address book, clothing, bedroom shoes, papers at bedside, hat, and personal toiletry items. No voiced complaints.

## 2018-06-27 NOTE — Discharge Summary (Signed)
Physician Discharge Summary  [name] [name] YKD:[id number] DOB: 26-Jan-1930 DOA: 06/22/2018  PCP: Briscoe Deutscher, DO  Admit date: 06/22/2018 Discharge date: 06/27/2018  Time spent: 35 minutes  Recommendations for Outpatient Follow-up:  1. Needs outpatient follow-up with general surgery as per their recommendations 2. Note medication changes including Lasix dosing, losartan dosing--additionally methotrexate is on hold until the patient follows with her regular rheumatologist in 1 to 2 weeks please arrange that appointment from the skilled facility 3. Aldactone discontinued this admission 4. Needs Chem-7, CBC 1 week 5. Prescription given for tramadol for postop pain  Discharge Diagnoses:  Active Problems:   Rheumatoid arthritis involving multiple sites ([place])   Anemia of chronic disease   CKD stage 3 due to type 2 diabetes mellitus (HCC)   DM (diabetes mellitus), type 2 with renal complications (HCC)   Acute combined systolic and diastolic heart failure (HCC)   DCM (dilated cardiomyopathy) (HCC)   Uncontrolled hypertension   Abdominal pain   Cholelithiasis   Elevated LFTs   Acute pulmonary edema (HCC)   Cholecystitis   Cerebral thrombosis with cerebral infarction   Discharge Condition: Improved  Diet recommendation: Diabetic heart healthy  Filed Weights   06/25/18 0301 06/26/18 0512 06/27/18 0510  Weight: 77.8 kg 76.8 kg 78.1 kg    History of present illness:  84 year old female CKD stage III 2/2 DM TY 2 Dilated cardiomyopathy EF 20-30%-recent increase in Lasix dose HTN Rheumatoid arthritis on prednisone previously on Humira Xeljanz Bactrim Orencia--seen by Dr. Eber Hong 1 week prior to this admit no changes to medications other than resumption of methotrexate Chronic lower extremity wounds with PAD in the past by vascular surgery ABIs 11/2017 Prior CVA  Last admitted 05/2018 with new onset heart failure cath at that time showed moderately elevated LVEDP and moderate pulmonary  hypertension CO 5.68/CI 2.9-- A small PFO was noted with left-to-right shunt.  Return to hospital 8/14 with abdominal pain, nausea, vomiting  alk phos 240 lipase 150 AST/ALT 119/203 direct bili 2.0 BNP 1146 troponin 0 0.03-MRCPconfirm any other findings  Utilization complicated by weakness and symptoms of strokes which were unfounded  Hospital Course:   ?  TIA-ruled out by stroke work-up-had episodic confusion and slurred speech code stroke was called CT MRI 8/16 was negative-do not think this was a stroke rather just medication effect and sleepiness no need for further work-up or follow-up  Cholecystitis-this post surgery 8/18 and appears well-continue perioperative aspirin-further care per surgery in terms of weightbearing precautions diet etc.  Rheumatoid arthritis significant osteoarthritis component-perioperatively on Solu-Cortef/I have asked the patient to hold methotrexate for 1 week-till seen by Dr. Sheppard Coil  Dilated cardiomyopathy/nonischemic per cath last month- continue Coreg 25 twice daily, hydralazine 100 3 times daily, Imdur 30 daily Losartan dose 100-->75 per cardiology Aldactone discontinued Continue Lasix 40 I/O -1.7 so far, weight 81-->78  Mild hypokalemia-replaced with K. Dur-check labs as an outpatient  Diabetes mellitus-Metformin 500 daily on hold continue SSI-sugars 1 43-200 range-continue gabapentin  Probable anemia chronic disease continue ferrous sulfate twice daily  Pulmonary hypertension-as above  Gout continue allopurinol 100 daily (not 200)  Probable carpal tunnel   Procedures:  Cholecystectomy 8/18   Consultations:  Cardiology  Neurology  General surgery  Discharge Exam: Vitals:   06/27/18 1140 06/27/18 1248  BP: 121/74 132/71  Pulse: (!) 53 77  Resp:  18  Temp: 98 F (36.7 C) 97.8 F (36.6 C)  SpO2: 98% 99%    General: Alert pleasant oriented no distress Cardiovascular: S1-S2 no  murmur rub or gallop Respiratory:  Chest clinically clear no added sound Abdomen soft postop wounds noted no lower extremity edema smile is symmetric power is 5/5 reflexes deferred uvula is midline shoulder shrug is bilaterally equal  Discharge Instructions   Discharge Instructions    Diet - low sodium heart healthy   Complete by:  As directed    Increase activity slowly   Complete by:  As directed      Allergies as of 06/27/2018      Reactions   Clonidine Derivatives Swelling   [name]   Fish Allergy Diarrhea, Swelling, Other (See Comments)   Turns skin "black," but can tolerate white fish Salmon- Diarrhea   Shellfish Allergy Hives   Doxycycline Rash   Indomethacin Other (See Comments)   Reaction not recalled by the patient   Lyrica [pregabalin] Other (See Comments)   Hallucinations   Methyldopa Other (See Comments)   Aldomet (for hypertension): Reaction not recalled by the patient   [employer] [citrus] Other (See Comments)   Indigestion/heartburn   Cetirizine Hcl Itching, Rash   Codeine Itching   Levaquin [levofloxacin In D5w] Rash   Tomato Rash      Medication List    STOP taking these medications   calcium-vitamin D 500-200 MG-UNIT tablet Commonly known as:  OSCAL WITH D   hydroxychloroquine 200 MG tablet Commonly known as:  PLAQUENIL   leflunomide 20 MG tablet Commonly known as:  ARAVA   methotrexate 2.5 MG tablet Commonly known as:  RHEUMATREX   spironolactone 25 MG tablet Commonly known as:  ALDACTONE     TAKE these medications   albuterol 108 (90 Base) MCG/ACT inhaler Commonly known as:  PROVENTIL HFA;VENTOLIN HFA Inhale 1 puff into the lungs every 6 (six) hours as needed for wheezing or shortness of breath.   allopurinol 100 MG tablet Commonly known as:  ZYLOPRIM Take 100 mg by mouth daily.   aspirin EC 81 MG tablet Take 81 mg by mouth daily.   carvedilol 25 MG tablet Commonly known as:  COREG Take 1  tablet (25 mg total) by mouth 2 (two) times daily with a meal.   D3-1000 1000 units capsule Generic drug:  Cholecalciferol Take 1,000 Units by mouth daily.   diclofenac sodium 1 % Gel Commonly known as:  VOLTAREN Apply 1 application topically daily.   ferrous sulfate 325 (65 FE) MG tablet Take 1 tablet (325 mg total) by mouth 2 (two) times daily with a meal.   furosemide 40 MG tablet Commonly known as:  LASIX Take 1 tablet (40 mg total) by mouth daily. Start taking on:  06/28/2018 What changed:    medication strength  how much to take   gabapentin 100 MG capsule Commonly known as:  NEURONTIN Take 1 capsule (100 mg total) by mouth at bedtime.   hydrALAZINE 100 MG tablet Commonly known as:  APRESOLINE TAKE 1 TABLET BY MOUTH THREE TIMES DAILY   hydroxypropyl methylcellulose / hypromellose 2.5 % ophthalmic solution Commonly known as:  ISOPTO TEARS / GONIOVISC Place 1 drop into both eyes 4 (four) times daily as needed for dry eyes.   isosorbide mononitrate 30 MG 24 hr tablet Commonly known as:  IMDUR Take 1 tablet (30 mg total) by mouth daily.   losartan 25 MG tablet Commonly known as:  COZAAR Take 3 tablets (75 mg total) by mouth daily. Start taking on:  06/28/2018 What changed:    medication strength  how much to take   metFORMIN 500 MG tablet Commonly known as:  GLUCOPHAGE Take 1 tablet (500 mg total) by mouth daily with breakfast.   Olopatadine HCl 0.2 % Soln Apply 1 drop to eye daily.   potassium chloride SA 20 MEQ tablet Commonly known as:  K-DUR,KLOR-CON Take 2 tablets (40 mEq total) by mouth daily. Start taking on:  06/28/2018 What changed:  how much to take   predniSONE 10 MG tablet Commonly known as:  DELTASONE Take 1 tablet (10 mg total) by mouth daily with breakfast.   traMADol 50 MG tablet Commonly known as:  ULTRAM Take 2 tablets (100 mg total) by mouth every 6 (six) hours as needed for up to 5 days for moderate pain or severe pain. What  changed:    how much to take  reasons to take this   triamcinolone cream 0.1 % Commonly known as:  KENALOG Apply 1 application topically 2 (two) times daily.      Allergies  Allergen Reactions  . Clonidine Derivatives Swelling    [name]  . Fish Allergy Diarrhea, Swelling and Other (See Comments)    Turns skin "black," but can tolerate white fish Salmon- Diarrhea  . Shellfish Allergy Hives  . Doxycycline Rash  . Indomethacin Other (See Comments)    Reaction not recalled by the patient  . Lyrica [Pregabalin] Other (See Comments)    Hallucinations   . Methyldopa Other (See Comments)    Aldomet (for hypertension): Reaction not recalled by the patient  . Orange Fruit [Citrus] Other (See Comments)    Indigestion/heartburn  . Cetirizine Hcl Itching and Rash  . Codeine Itching  . Levaquin [Levofloxacin In D5w] Rash  . Tomato Rash    Contact information for follow-up providers    [facility], [place]. Go on 07/14/2018.   Specialty:  General Surgery Why:  at 9:15 am. Please arrive at 8:45 to complete paperwork. Please bring photo ID and insurance card Contact information: [place] [phone]           Contact information for after-discharge care    Destination    [facility] .   Service:  Skilled Nursing Contact information: [address] [place] [phone]                   The results of significant diagnostics from this hospitalization (including imaging, microbiology, ancillary and laboratory) are listed below for reference.    Significant Diagnostic Studies: Dg Chest 2 View  Result Date: 06/22/2018 CLINICAL DATA:  Chest pain and abdominal pain EXAM: CHEST - 2 VIEW COMPARISON:  06/01/2018 FINDINGS: Cardiac shadow is again enlarged but stable. Mild  vascular congestion is noted without significant edema. No focal infiltrate is seen. No acute bony abnormality is noted. Chronic changes about both shoulder joints are seen. IMPRESSION: Cardiomegaly and mild congestive changes. Electronically Signed   By: Inez Catalina [name]   On: 06/22/2018 22:26   Dg Chest 2 View  Result Date: 05/28/2018 CLINICAL DATA:  Weakness 6 days with intermittent altered mental status. EXAM: CHEST - 2 VIEW COMPARISON:  11/16/2017 FINDINGS: Lungs are adequately inflated demonstrate airspace consolidation over the left midlung likely left upper lobe pneumonia. No evidence of effusion. Slight worsening moderate cardiomegaly. Remainder of the exam is unchanged IMPRESSION: Mild airspace opacification over the left upper lobe likely pneumonia. Slight  worsening moderate cardiomegaly. Electronically Signed   By: Marin Olp [name]   On: 05/28/2018 16:10   Ct Head Wo Contrast  Result Date: 05/29/2018 CLINICAL DATA:  Drowsiness with RIGHT facial droop. EXAM: CT HEAD WITHOUT CONTRAST TECHNIQUE: Contiguous axial images were obtained from the base of the skull through the vertex without intravenous contrast. COMPARISON:  05/28/2018. FINDINGS: Brain: No evidence for acute infarction, hemorrhage, mass lesion, hydrocephalus, or extra-axial fluid. Generalized atrophy. Moderate small vessel disease. Vascular: Calcification of the cavernous internal carotid arteries consistent with cerebrovascular atherosclerotic disease. No signs of intracranial large vessel occlusion. Skull: Calvarium intact. Sinuses/Orbits: Clear sinuses.  No orbital findings. Other: No mastoid fluid. IMPRESSION: Atrophy and white matter disease, stable in appearance from 05/28/2018. No acute intracranial findings. Electronically Signed   By: Staci Righter [name]   On: 05/29/2018 21:44   Ct Head Wo Contrast  Result Date: 05/28/2018 CLINICAL DATA:  Generalized weakness since 05/22/2018. Intermittent altered mental status. EXAM: CT  HEAD WITHOUT CONTRAST TECHNIQUE: Contiguous axial images were obtained from the base of the skull through the vertex without intravenous contrast. COMPARISON:  11/29/2016. FINDINGS: Brain: Diffusely enlarged ventricles and subarachnoid spaces. Patchy white matter low density in both cerebral hemispheres. No intracranial hemorrhage, mass lesion or CT evidence of acute infarction. Vascular: No hyperdense vessel or unexpected calcification. Skull: Bilateral hyperostosis frontalis. Sinuses/Orbits: Status post bilateral cataract extraction. Single residual opacified left ethmoid air cell. Resolved right mastoid air cell opacification. Other: None. IMPRESSION: 1. No acute abnormality. 2. Stable atrophy. 3. Progressive chronic small vessel white matter ischemic changes in both cerebral hemispheres. 4. Improved left ethmoid sinusitis. 5. Resolved right mastoid mucosal thickening and fluid. Electronically Signed   By: Claudie Revering [name]   On: 05/28/2018 15:56   Ct Angio Chest Pe W And/or Wo Contrast  Result Date: 05/28/2018 CLINICAL DATA:  Shortness of breath. EXAM: CT ANGIOGRAPHY CHEST WITH CONTRAST TECHNIQUE: Multidetector CT imaging of the chest was performed using the standard protocol during bolus administration of intravenous contrast. Multiplanar CT image reconstructions and MIPs were obtained to evaluate the vascular anatomy. CONTRAST:  100 mL ISOVUE-370 IOPAMIDOL (ISOVUE-370) INJECTION 76% COMPARISON:  Chest CT angiogram September 11, 2015; chest radiograph May 28, 2018 FINDINGS: Cardiovascular: There is no demonstrable pulmonary embolus. There is no thoracic aortic aneurysm. No dissection is seen in the thoracic aorta. Note that the contrast bolus in the aorta is less than optimal for assessment for potential dissection. Visualized great vessels appear unremarkable except for slight calcification in the left common carotid artery. Note that the left common carotid and right innominate arteries arise as a common  trunk, an anatomic variant. There is no appreciable pericardial effusion or pericardial thickening. There is aortic atherosclerosis. There are foci of coronary artery calcification. Mediastinum/Nodes: There is a subcentimeter nodular opacity in the left lobe of the thyroid. No dominant thyroid mass evident. There is no evident thoracic adenopathy. No esophageal lesions are appreciable. Lungs/Pleura: There is airspace consolidation in the anterior segment left upper lobe consistent with pneumonia. There is atelectatic change in each lower lobe with a small right pleural effusion evident. On axial slice 69 series 11, there is a 5 mm nodular opacity in the lateral segment of the right middle lobe. There is a nodular opacity in the anterior segment of the right upper lobe measuring 1.5 x 1.1 cm. This nodular opacity is best seen on coronal slice 88 series 7 but is also appreciable on sagittal slice 61 series 8 and axial slice 23  series 11. Upper Abdomen: In the visualized upper abdominal region, there is mild reflux into the inferior vena cava and hepatic veins suggesting increase in right heart pressure. There is atherosclerotic calcification in the aorta and proximal major mesenteric arterial vessels. There is a cyst arising from the lateral upper pole right kidney measuring 5.2 x 5.1 cm. Musculoskeletal: There is degenerative change in the thoracic spine. There are no blastic or lytic bone lesions. There is extensive arthropathy in each shoulder. No evident chest wall lesions. Review of the MIP images confirms the above findings. IMPRESSION: 1. There is a 1.5 x 1.1 cm nodular opacity in the anterior segment of the right upper lobe. Consider one of the following in 3 months for both low-risk and high-risk individuals: (a) repeat chest CT, (b) follow-up PET-CT, or (c) tissue sampling. This recommendation follows the consensus statement: Guidelines for Management of Incidental Pulmonary Nodules Detected on CT Images:  From the Fleischner Society 2017; Radiology 2017; 284:228-243. 2. Anterior segment left upper lobe airspace consolidation consistent with pneumonia. 3. No demonstrable pulmonary embolus. No thoracic aortic aneurysm. No dissection seen. It should be noted that the contrast bolus in the aorta is insufficient to exclude dissection confidently as a differential consideration by radiography. 4. There is aortic atherosclerosis. There are foci of coronary artery calcification. There are foci of great vessel and mesenteric arterial vessel calcification as well. 5.  Small right pleural effusion with bibasilar atelectasis. 6. Subcentimeter left thyroid nodule. Per consensus guidelines, this lesion does not warrant additional imaging surveillance. 7.  No demonstrable thoracic adenopathy. 8. Reflux into the inferior vena cava and hepatic veins may be indicative of a degree of increase in right heart pressure. Aortic Atherosclerosis (ICD10-I70.0). Electronically Signed   By: Lowella Grip III [name]   On: 05/28/2018 19:08   Mr Jodene Nam Head Wo Contrast  Result Date: 06/24/2018 CLINICAL DATA:  Stroke follow-up EXAM: MRA HEAD WITHOUT CONTRAST TECHNIQUE: Angiographic images of the Circle of Willis were obtained using MRA technique without intravenous contrast. COMPARISON:  Brain MRI 06/24/2018 FINDINGS: ANTERIOR CIRCULATION: --Intracranial internal carotid arteries: Normal. --Anterior cerebral arteries: Normal. Both A1 segments are present. Patent anterior communicating artery. --Middle cerebral arteries: Normal. --Posterior communicating arteries: Present on the right POSTERIOR CIRCULATION: --Basilar artery: Normal. --Posterior cerebral arteries: Normal. --Superior cerebellar arteries: Normal. --Inferior cerebellar arteries: Normal anterior and posterior inferior cerebellar arteries. IMPRESSION: Normal intracranial MRA. Electronically Signed   By: Ulyses Jarred [name]   On: 06/24/2018 15:39   Mr Brain Wo Contrast  Result Date:  06/24/2018 CLINICAL DATA:  84 year old female who was found with left side weakness and difficult to arouse this morning. Code stroke. EXAM: MRI HEAD WITHOUT CONTRAST TECHNIQUE: Multiplanar, multiecho pulse sequences of the brain and surrounding structures were obtained without intravenous contrast. COMPARISON:  Head CT without contrast 0628 hours today. Brain MRI 01/17/2014. FINDINGS: Brain: There are 1 or more small foci of left occipital horn periventricular white matter abnormal trace diffusion (series 3, image 18) which appear isointense to mildly facilitated on ADC. Associated T2 and FLAIR hyperintensity with no hemorrhage or mass effect. These are new since 2015. Superimposed chronic but progressed since 2015 patchy and confluent additional bilateral periventricular white matter T2 and FLAIR hyperintensity which is facilitated on diffusion. No convincing restricted diffusion or acute infarct. No midline shift, mass effect, evidence of mass lesion, ventriculomegaly, extra-axial collection or acute intracranial hemorrhage. Cervicomedullary junction and pituitary are within normal limits. Possible small left paracentral pons chronic lacune is stable (series 7,  image 10). Tiny chronic lacune in the inferior right cerebellum on image 7 also appears stable. The deep gray matter nuclei remain normal for age. No cortical encephalomalacia or chronic cerebral blood products. Vascular: Major intracranial vascular flow voids are stable since 2015. Skull and upper cervical spine: Negative visible cervical spine. Visualized bone marrow signal is within normal limits. Sinuses/Orbits: Stable and negative. 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 Mastoid air cells remain well pneumatized. Grossly normal visible internal auditory structures. Scalp and face soft tissues appear negative. IMPRESSION: 1. No acute infarct identified. Small subacute appearing left occipital lobe periventricular white matter  infarcts with no associated hemorrhage or mass effect. 2. Mild to moderate for age abnormal periventricular white matter signal elsewhere has progressed since 2015. Small chronic lacunes suspected in the left pons and cerebellum. Electronically Signed   By: Genevie Ann [name]   On: 06/24/2018 11:17   US Abdomen Complete  Result Date: 06/23/2018 CLINICAL DATA:  Abdominal pain for 3 days. History of appendectomy, hysterectomy. EXAM: ABDOMEN ULTRASOUND COMPLETE COMPARISON:  Abdominal ultrasound November 30, 2016 and CT abdomen and pelvis September 11, 2015. FINDINGS: Gallbladder: Mild distended gallbladder with punctate echogenic layering gallbladder sludge and cholelithiasis measuring to 4 mm. No gallbladder wall thickening or pericholecystic fluid. No sonographic Murphy sign elicited. Common bile duct: Diameter: Dilated common bile duct measuring to 15 mm with suspected sludge. Liver: No focal lesion identified. Mild intrahepatic biliary dilatation. Within normal limits in parenchymal echogenicity. Portal vein is patent on color Doppler imaging with normal direction of blood flow towards the liver. IVC: No abnormality visualized. Pancreas: Visualized portion unremarkable. Spleen: Size and appearance within normal limits. Right Kidney: Length: 10.5 cm. Mildly increased cortical echogenicity. No solid mass or hydronephrosis visualized. 5.2 cm homogeneously anechoic cyst upper pole RIGHT kidney. Left Kidney: Length: 11.4 cm. Mildly increased cortical echogenicity. No solid mass or hydronephrosis visualized. Abdominal aorta: No aneurysm visualized. Echogenic calcific atherosclerosis. Other findings: None. IMPRESSION: 1. Cholelithiasis and sludge without sonographic findings of acute cholecystitis. 2. Biliary dilatation with potential choledocholithiasis/sludge. MRCP may be more definitive. 3. Mildly echogenic kidneys seen with medical renal disease. Aortic Atherosclerosis (ICD10-I70.0). Electronically Signed   By: Elon Alas [name]   On: 06/23/2018 00:37   Mr Abdomen Mrcp Wo Contrast  Result Date: 06/23/2018 CLINICAL DATA:  Epigastric pain. Elevated LFTs a gallstone. Concern for common bile duct stone. EXAM: MRI ABDOMEN WITHOUT CONTRAST  (INCLUDING MRCP) TECHNIQUE: Multiplanar multisequence MR imaging of the abdomen was performed. Heavily T2-weighted images of the biliary and pancreatic ducts were obtained, and three-dimensional MRCP images were rendered by post processing. COMPARISON:  Ultrasound 06/22/2018 FINDINGS: Lower chest:  Lung bases are clear. Hepatobiliary: No intrahepatic biliary duct dilatation. Gallbladder nondistended. Sludge in gallstones described on comparison ultrasound are difficult to define. No intrahepatic duct dilatation. The common hepatic duct measures 14 mm. Duct tapers to 9 mm through the common bile duct. Duct measures 9 mm at the ampullary region. No evidence clear stone within the common bile duct. There is some dependent material within the common bile duct through the pancreatic head region (image 24/8). This may represent sludge. No obstructing lesion at the ampulla. Potential sludge within the distal common bile duct (image 25/8) layers dependently. Pancreas: Pancreatic duct is nondilated. No pancreatic inflammation. Spleen: Normal spleen. Adrenals/urinary tract: Adrenal glands normal. Benign-appearing cyst of the RIGHT kidney Stomach/Bowel: Stomach and limited of the small bowel is unremarkable Vascular/Lymphatic: Abdominal aortic normal caliber. No retroperitoneal periportal lymphadenopathy. Musculoskeletal: No  aggressive osseous lesion IMPRESSION: 1. Mild dilatation of the common hepatic duct and common bile duct. No obstructing lesion is identified. Small amount sludge layers dependently within the common bile duct. 2. No gallstones evident. Gallstones and sludge identified on comparison ultrasound. 3. No intrahepatic duct dilatation. 4. No pancreatic inflammation Electronically Signed    By: Suzy Bouchard [name]   On: 06/23/2018 12:22   Mr 3d Recon At Scanner  Result Date: 06/23/2018 CLINICAL DATA:  Epigastric pain. Elevated LFTs a gallstone. Concern for common bile duct stone. EXAM: MRI ABDOMEN WITHOUT CONTRAST  (INCLUDING MRCP) TECHNIQUE: Multiplanar multisequence MR imaging of the abdomen was performed. Heavily T2-weighted images of the biliary and pancreatic ducts were obtained, and three-dimensional MRCP images were rendered by post processing. COMPARISON:  Ultrasound 06/22/2018 FINDINGS: Lower chest:  Lung bases are clear. Hepatobiliary: No intrahepatic biliary duct dilatation. Gallbladder nondistended. Sludge in gallstones described on comparison ultrasound are difficult to define. No intrahepatic duct dilatation. The common hepatic duct measures 14 mm. Duct tapers to 9 mm through the common bile duct. Duct measures 9 mm at the ampullary region. No evidence clear stone within the common bile duct. There is some dependent material within the common bile duct through the pancreatic head region (image 24/8). This may represent sludge. No obstructing lesion at the ampulla. Potential sludge within the distal common bile duct (image 25/8) layers dependently. Pancreas: Pancreatic duct is nondilated. No pancreatic inflammation. Spleen: Normal spleen. Adrenals/urinary tract: Adrenal glands normal. Benign-appearing cyst of the RIGHT kidney Stomach/Bowel: Stomach and limited of the small bowel is unremarkable Vascular/Lymphatic: Abdominal aortic normal caliber. No retroperitoneal periportal lymphadenopathy. Musculoskeletal: No aggressive osseous lesion IMPRESSION: 1. Mild dilatation of the common hepatic duct and common bile duct. No obstructing lesion is identified. Small amount sludge layers dependently within the common bile duct. 2. No gallstones evident. Gallstones and sludge identified on comparison ultrasound. 3. No intrahepatic duct dilatation. 4. No pancreatic inflammation Electronically  Signed   By: Suzy Bouchard [name]   On: 06/23/2018 12:22   Dg Chest Port 1 View  Result Date: 06/01/2018 CLINICAL DATA:  Recent pneumonia EXAM: PORTABLE CHEST 1 VIEW COMPARISON:  Chest radiograph and chest CT May 28, 2018 FINDINGS: There has been interval clearing of infiltrate from the left upper lobe. Mild atelectatic change remains in this area. There is no new opacity elsewhere. There is cardiomegaly with mild pulmonary venous hypertension. No adenopathy. There is advanced arthropathy in both shoulders. There is postoperative change in the right shoulder region. IMPRESSION: Interval clearing of infiltrate from left upper lobe. Mild atelectasis remains in this area. No new opacity. There is a degree of underlying pulmonary vascular congestion. There is advanced arthropathy in both shoulders with evidence suggesting avascular necrosis in each humeral head. Note that the nodular opacity in the right upper lobe seen on recent chest CT is not appreciable by radiography. Please see recommendations with respect to recent chest CT report. Electronically Signed   By: Lowella Grip III [name]   On: 06/01/2018 07:09   Ct Head Code Stroke Wo Contrast  Result Date: 06/24/2018 CLINICAL DATA:  Code stroke. Initial evaluation for acute left arm weakness, slurred speech. EXAM: CT HEAD WITHOUT CONTRAST TECHNIQUE: Contiguous axial images were obtained from the base of the skull through the vertex without intravenous contrast. COMPARISON:  Prior CT from 05/29/2017 FINDINGS: Brain: Atrophy with moderate chronic microvascular ischemic disease. No acute intracranial hemorrhage. No acute large vessel territory infarct. No mass lesion, midline shift or mass effect.  No hydrocephalus. No extra-axial fluid collection. Vascular: No hyperdense vessel. Scattered vascular calcifications noted within the carotid siphons. Skull: Scalp soft tissues and calvarium within normal limits. Sinuses/Orbits: Globes and orbital soft tissues  demonstrate no acute finding. Mild layering opacity within left sphenoid sinus. Small right mastoid effusion. Other: None. ASPECTS [facility] Stroke Program Early CT Score) - Ganglionic level infarction (caudate, lentiform nuclei, internal capsule, insula, M1-M3 cortex): 7 - Supraganglionic infarction (M4-M6 cortex): 3 Total score (0-10 with 10 being normal): 10 IMPRESSION: 1. No acute intracranial infarct or other abnormality. 2. ASPECTS is 10. 3. Atrophy with moderate chronic small vessel ischemic disease. 4. Mild acute left sphenoid sinusitis. These results were communicated to Dr. Lorraine Lax At 6:34 amon 8/16/2019by text page via the [facility] messaging system. Electronically Signed   By: Jeannine Boga [name]   On: 06/24/2018 06:35    Microbiology: Recent Results (from the past 240 hour(s))  Surgical pcr screen     Status: None   Collection Time: 06/26/18  1:21 AM  Result Value Ref Range Status   MRSA, PCR NEGATIVE NEGATIVE Final   Staphylococcus aureus NEGATIVE NEGATIVE Final    Comment: (NOTE) The Xpert SA Assay (FDA approved for NASAL specimens in patients 75 years of age and older), is one component of a comprehensive surveillance program. It is not intended to diagnose infection nor to guide or monitor treatment. Performed at [facility], [place] [address], [place], [place] [address]      Labs: Basic Metabolic Panel: Recent Labs  Lab 06/23/18 0500 06/24/18 0524 06/25/18 0607 06/26/18 0445 06/27/18 0630  NA 139 137 138 140 140  K 3.7 3.5 3.1* 3.6 3.5  CL 103 100 101 105 104  CO2 27 27 25 27 26   GLUCOSE 89 159* 144* 143* 108*  BUN 18 17 14 15 17   CREATININE 1.18* 1.21* 1.15* 1.11* 1.10*  CALCIUM 9.1 8.9 9.0 9.0 8.7*  PHOS  --   --   --   --  2.7   Liver Function Tests: Recent Labs  Lab 06/22/18 2153 06/23/18 0500 06/24/18 0524 06/25/18 0607 06/26/18 0445 06/27/18 0630  AST 119* 120* 43* 27 24  --   ALT 203* 204* 140* 104* 80*  --   ALKPHOS 248* 249* 225* 184*  161*  --   BILITOT 2.0* 1.2 0.7 0.9 0.9  --   PROT 6.8 6.6 6.2* 6.6 6.5  --   ALBUMIN 3.3* 3.2* 3.0* 3.0* 3.0* 2.9*   Recent Labs  Lab 06/22/18 2153 06/23/18 0500  LIPASE 150* 77*   No results for input(s): AMMONIA in the last 168 hours. CBC: Recent Labs  Lab 06/23/18 0500 06/24/18 0524 06/25/18 0607 06/26/18 0445 06/27/18 0630  WBC 8.3 5.9 7.4 8.0 11.9*  NEUTROABS 5.1 4.5 5.4 6.2 8.9*  HGB 10.3* 10.1* 11.0* 11.1* 10.5*  HCT 31.8* 30.3* 33.6* 34.2* 32.7*  MCV 90.6 89.6 89.6 89.5 92.1  PLT 306 266 290 263 237   Cardiac Enzymes: Recent Labs  Lab 06/22/18 2153 06/23/18 0500 06/23/18 1015 06/23/18 1628  TROPONINI <0.03 0.03* 0.03* 0.03*   BNP: BNP (last 3 results) Recent Labs    05/28/18 1614 06/22/18 2153  BNP 2,077.6* 1,146.7*    ProBNP (last 3 results) No results for input(s): PROBNP in the last 8760 hours.  CBG: Recent Labs  Lab 06/26/18 1014 06/26/18 1638 06/26/18 2119 06/27/18 0727 06/27/18 1154  GLUCAP 200* 134* 128* 107* 131*       Signed:  Nita Sells MD  [facility] 06/27/2018, 2:18 PM

## 2018-06-27 NOTE — Progress Notes (Signed)
Physical Therapy Treatment Patient Details Name: [name] [name] MRN: [id number] DOB: [birth year] Today's Date: 06/27/2018    History of Present Illness [name] [name] is a 84 y.o. female with history of chronic systolic heart failure last EF measured last month was 20 to 25%, hypertension, diabetes mellitus, rheumatoid arthritis who was recently admitted for acute CHF and discharged to rehab and had gone back to home last week started experiencing nausea with abdominal pain last 3 days.  MRI revealed no acute infarct, subacute L occipital infarct    PT Comments    Pt is agreeable to working with therapy today. Pt is making slow progress towards her goals, however continues to be limited in safe mobility by decreased strength and endurance. Pt currently min guard for transfers and ambulation of 50 feet with RW. Pt able to participate in LE exercise seated in recliner. D/c plans remain appropriate at this time. PT will continue to follow acutely.    Follow Up Recommendations  SNF;Supervision/Assistance - 24 hour     Equipment Recommendations  None recommended by PT       Precautions / Restrictions Precautions Precautions: Fall Restrictions Weight Bearing Restrictions: No    Mobility  Bed Mobility               General bed mobility comments: seated EoB on entry   Transfers Overall transfer level: Needs assistance Equipment used: Rolling walker (2 wheeled) Transfers: Sit to/from Stand Sit to Stand: Min guard         General transfer comment: min guard for safety with power up and steadying with RW  Ambulation/Gait Ambulation/Gait assistance: Min guard Gait Distance (Feet): 50 Feet Assistive device: Rolling walker (2 wheeled) Gait Pattern/deviations: Step-through pattern;Decreased stride length Gait velocity: decrease Gait velocity interpretation: <1.8 ft/sec, indicate of risk for recurrent falls General Gait Details: pt wtih slow, steady gait, increased SoB and  fatigue with duration        Balance Overall balance assessment: Needs assistance Sitting-balance support: Single extremity supported Sitting balance-Leahy Scale: Good     Standing balance support: Bilateral upper extremity supported Standing balance-Leahy Scale: Poor                              Cognition Arousal/Alertness: Awake/alert Behavior During Therapy: WFL for tasks assessed/performed Overall Cognitive Status: Within Functional Limits for tasks assessed                                        Exercises General Exercises - Lower Extremity Long Arc Quad: AROM;Both;10 reps;Seated Hip ABduction/ADduction: AROM;Both;10 reps;Seated Hip Flexion/Marching: AROM;Both;10 reps;Seated Toe Raises: AROM;Both;10 reps;Seated Heel Raises: AROM;Both;10 reps;Seated    General Comments General comments (skin integrity, edema, etc.): VSS, pt daughter present during session      Pertinent Vitals/Pain Pain Assessment: Faces Faces Pain Scale: Hurts a little bit Pain Location: generalized pain with initial ambulation from OA Pain Descriptors / Indicators: Grimacing Pain Intervention(s): Limited activity within patient's tolerance;Monitored during session;Repositioned           PT Goals (current goals can now be found in the care plan section) Acute Rehab PT Goals Patient Stated Goal: to walk further PT Goal Formulation: With patient Time For Goal Achievement: 07/08/18 Potential to Achieve Goals: Good Progress towards PT goals: Progressing toward goals    Frequency    Min 3X/week  PT Plan Current plan remains appropriate       AM-PAC PT "6 Clicks" Daily Activity  Outcome Measure  Difficulty turning over in bed (including adjusting bedclothes, sheets and blankets)?: A Little Difficulty moving from lying on back to sitting on the side of the bed? : Unable Difficulty sitting down on and standing up from a chair with arms (e.g.,  wheelchair, bedside commode, etc,.)?: Unable Help needed moving to and from a bed to chair (including a wheelchair)?: A Little Help needed walking in hospital room?: A Little Help needed climbing 3-5 steps with a railing? : A Little 6 Click Score: 14    End of Session Equipment Utilized During Treatment: Gait belt Activity Tolerance: Patient limited by fatigue Patient left: with call bell/phone within reach;in chair;with family/visitor present Nurse Communication: Mobility status PT Visit Diagnosis: Muscle weakness (generalized) (M62.81);Difficulty in walking, not elsewhere classified (R26.2)     Time: 1962-2297 PT Time Calculation (min) (ACUTE ONLY): 16 min  Charges:  $Gait Training: 8-22 mins                     Oluwaseyi Raffel B. Beverely Risen PT, DPT Acute Rehabilitation  [phone] Pager [phone]     Elon Alas Fleet 06/27/2018, 10:25 AM

## 2018-06-27 NOTE — Clinical Social Work Note (Signed)
Patient has insurance approval to discharge to SNF today. Paged MD to notify.  Charlynn Court, CSW [phone]

## 2018-06-27 NOTE — Progress Notes (Signed)
Telemetry discontinued per order.  Patient and her daughter updated on plan of care.

## 2018-06-27 NOTE — Clinical Social Work Placement (Signed)
   CLINICAL SOCIAL WORK PLACEMENT  NOTE  Date:  06/27/2018  Patient Details  Name: [name] [name] MRN: [id number] Date of Birth: Dec 23, 1929  Clinical Social Work is seeking post-discharge placement for this patient at the Skilled  Nursing Facility level of care (*CSW will initial, date and re-position this form in  chart as items are completed):      Patient/family provided with [facility] Clinical Social Work Department's list of facilities offering this level of care within the geographic area requested by the patient (or if unable, by the patient's family).      Patient/family informed of their freedom to choose among providers that offer the needed level of care, that participate in Medicare, Medicaid or managed care program needed by the patient, have an available bed and are willing to accept the patient.      Patient/family informed of [facility]'s ownership interest in [facility] and [facility], as well as of the fact that they are under no obligation to receive care at these facilities.  PASRR submitted to EDS on 06/27/18     PASRR number received on       Existing PASRR number confirmed on 06/27/18     FL2 transmitted to all facilities in geographic area requested by pt/family on 06/27/18     FL2 transmitted to all facilities within larger geographic area on       Patient informed that his/her managed care company has contracts with or will negotiate with certain facilities, including the following:        Yes   Patient/family informed of bed offers received.  Patient chooses bed at [facility]     Physician recommends and patient chooses bed at      Patient to be transferred to [facility] on 06/27/18.  Patient to be transferred to facility by [facility]     Patient family notified on 06/27/18 of transfer.  Name of family member notified:  [name]     PHYSICIAN Please prepare prescriptions     Additional  Comment:    _______________________________________________ Margarito Liner, LCSW 06/27/2018, 3:07 PM

## 2018-06-27 NOTE — Clinical Social Work Note (Signed)
Clinical Social Work Assessment  Patient Details  Name: [name] [name] MRN: [id number] Date of Birth: February 24, 1930  Date of referral:  06/27/18               Reason for consult:  Facility Placement, Discharge Planning                Permission sought to share information with:  Facility Sport and exercise psychologist, Family Supports Permission granted to share information::  Yes, Verbal Permission Granted  Name::        Agency::  [facility]  Relationship::  Daughter at bedside  Contact Information:     Housing/Transportation Living arrangements for the past 2 months:  Single Family Home Source of Information:  Patient, Medical Team, Adult Children Patient Interpreter Needed:  None Criminal Activity/Legal Involvement Pertinent to Current Situation/Hospitalization:  No - Comment as needed Significant Relationships:  Adult Children, Other Family Members Lives with:  Self Do you feel safe going back to the place where you live?  Yes Need for family participation in patient care:  Yes (Comment)  Care giving concerns:  PT recommending SNF once medically stable for discharge   Social Worker assessment / plan:  CSW met with patient. Daughter at bedside. CSW introduced role and explained that PT recommendations would be discussed. Patient and her daughter agreeable to SNF placement. Patient was at [facility] from 7/27-8/9 (13 days) and would like to return for about a week of rehab until insurance stops paying at 100%. [facility] is able to offer a bed and will start insurance authorization. No further concerns. CSW encouraged patient and her daughter to contact CSW as needed. CSW will continue to follow patient and her daughter for support and facilitate discharge to SNF once medically stable.  Employment status:  Retired Nurse, adult PT Recommendations:  [place] / Referral to community resources:  [place]  Patient/Family's Response to care:  Patient and her daughter agreeable to SNF placement. [name] family supportive and involved in patient's care. Patient and her daughter appreciated social work intervention.  Patient/Family's Understanding of and Emotional Response to Diagnosis, Current Treatment, and Prognosis:  Patient and her daughter have a good understanding of the reason for admission and her need for more rehab prior to returning home. Patient and her daughter appears happy with hospital care.  Emotional Assessment Appearance:  Appears stated age Attitude/Demeanor/Rapport:  Engaged, Gracious Affect (typically observed):  Accepting, Appropriate, Calm, Pleasant Orientation:  Oriented to Self, Oriented to Place, Oriented to  Time, Oriented to Situation Alcohol / Substance use:  Never Used Psych involvement (Current and /or in the community):  No (Comment)  Discharge Needs  Concerns to be addressed:  Care Coordination Readmission within the last 30 days:  Yes Current discharge risk:  Dependent with Mobility, Lives alone Barriers to Discharge:  Continued Medical Work up, [place], LCSW 06/27/2018, 10:19 AM

## 2018-06-27 NOTE — NC FL2 (Signed)
[place] MEDICAID FL2 LEVEL OF CARE SCREENING TOOL     IDENTIFICATION  Patient Name: [name] [name] Birthdate: 03-23-1930 Sex: female Admission Date (Current Location): 06/22/2018  [facility] and [place] Number:  Producer, television/film/video and Address:  [facility], [address] [place], [place] [address]      Provider Number: [id number]  Attending Physician Name and Address:  Rhetta Mura, MD  Relative Name and Phone Number:       Current Level of Care: Hospital Recommended Level of Care: Skilled Nursing Facility Prior Approval Number:    Date Approved/Denied:   PASRR Number: [id number]  Discharge Plan: SNF    Current Diagnoses: Patient Active Problem List   Diagnosis Date Noted  . Cerebral thrombosis with cerebral infarction 06/24/2018  . Abdominal pain 06/23/2018  . Cholelithiasis 06/23/2018  . Elevated LFTs 06/23/2018  . Acute pulmonary edema (HCC)   . Cholecystitis   . Uncontrolled hypertension 06/18/2018  . Acute metabolic encephalopathy 06/18/2018  . Acute kidney injury superimposed on chronic kidney disease (HCC) 06/18/2018  . Pneumonia of left upper lobe due to infectious organism (HCC)   . Elevated troponin   . DCM (dilated cardiomyopathy) (HCC)   . Benign hypertensive heart and kidney disease with CHF and stage 3 chronic kidney disease (HCC) 05/28/2018  . Degenerative joint disease involving multiple joints 05/06/2018  . Adhesive capsulitis of right shoulder 10/12/2017  . Idiopathic chronic gout of multiple sites with tophus 08/25/2017  . Rheumatic nodule 06/28/2017  . History of total knee arthroplasty, bilateral 02/18/2017  . History of rotator cuff surgery 02/18/2017  . Obesity (BMI 30.0-34.9) 12/08/2016  . High risk medications (not anticoagulants) long-term use 09/21/2016  . Hammer toe 10/31/2015  . Acute combined systolic and diastolic heart failure (HCC) 09/12/2015  . Midline low back pain without sciatica  06/27/2014  . Bilateral edema of lower extremity 06/05/2014  . CKD stage 3 due to type 2 diabetes mellitus (HCC) 06/05/2014  . DM (diabetes mellitus), type 2 with renal complications (HCC) 06/05/2014    Class: Chronic  . Anemia of chronic disease 05/01/2014  . Fluctuating blood pressure 02/06/2014  . Stroke (HCC) 01/17/2014  . Rheumatoid arthritis involving multiple sites (HCC) 12/19/2013    Class: Chronic  . CAD (coronary artery disease) 10/18/2013  . Fibromyalgia 05/10/2013  . Reactive airway disease 04/19/2013  . Hyperlipidemia 04/27/2007  . Allergic rhinitis 04/27/2007  . Diverticulosis 04/27/2007    Orientation RESPIRATION BLADDER Height & Weight     Self, Time, Situation, Place  Normal Continent Weight: 172 lb 1.6 oz (78.1 kg) Height:  5\' 4"  (162.6 cm)  BEHAVIORAL SYMPTOMS/MOOD NEUROLOGICAL BOWEL NUTRITION STATUS  (None) (None) Continent Diet(Heart healthy/carb modified)  AMBULATORY STATUS COMMUNICATION OF NEEDS Skin   Limited Assist Verbally Surgical wounds, Other (Comment)(Non-pressure wound on left buttocks: No dressing.)                       Personal Care Assistance Level of Assistance  Bathing, Feeding, Dressing Bathing Assistance: Maximum assistance Feeding assistance: Limited assistance Dressing Assistance: Maximum assistance     Functional Limitations Info  Sight, Hearing, Speech Sight Info: Adequate Hearing Info: Adequate Speech Info: Adequate    SPECIAL CARE FACTORS FREQUENCY  PT (By licensed PT), Blood pressure, OT (By licensed OT)     PT Frequency: 5 x week OT Frequency: 5 x week            Contractures Contractures Info: Not present  Additional Factors Info  Code Status, Allergies Code Status Info: Full code Allergies Info: Clonidine Derivatives, Fish Allergy, Shellfish Allergy, Doxycycline, Indomethacin, Lyrica (Pregabalin), Methyldopa, Orange Fruit (Citrus), Cetirizine Hcl, Codeine, Levaquin (Levofloxacin In D5w), Tomato            Current Medications (06/27/2018):  This is the current hospital active medication list Current Facility-Administered Medications  Medication Dose Route Frequency Provider Last Rate Last Dose  . acetaminophen (TYLENOL) tablet 650 mg  650 mg Oral Q6H PRN Abigail Miyamoto, MD   650 mg at 06/26/18 2120   Or  . acetaminophen (TYLENOL) suppository 650 mg  650 mg Rectal Q6H PRN Abigail Miyamoto, MD      . albuterol (PROVENTIL) (2.5 MG/3ML) 0.083% nebulizer solution 3 mL  3 mL Inhalation Q6H PRN Abigail Miyamoto, MD      . allopurinol (ZYLOPRIM) tablet 100 mg  100 mg Oral Daily Abigail Miyamoto, MD   100 mg at 06/27/18 0948  . carvedilol (COREG) tablet 25 mg  25 mg Oral BID WC Abigail Miyamoto, MD   25 mg at 06/27/18 0608  . furosemide (LASIX) tablet 40 mg  40 mg Oral Daily Abigail Miyamoto, MD   40 mg at 06/27/18 0947  . gabapentin (NEURONTIN) capsule 100 mg  100 mg Oral QHS Abigail Miyamoto, MD   100 mg at 06/26/18 2119  . hydrALAZINE (APRESOLINE) injection 10 mg  10 mg Intravenous Q4H PRN Abigail Miyamoto, MD   10 mg at 06/24/18 1317  . hydrALAZINE (APRESOLINE) tablet 100 mg  100 mg Oral TID Abigail Miyamoto, MD   100 mg at 06/27/18 0947  . insulin aspart (novoLOG) injection 0-9 Units  0-9 Units Subcutaneous TID WC Abigail Miyamoto, MD   1 Units at 06/26/18 1733  . isosorbide mononitrate (IMDUR) 24 hr tablet 30 mg  30 mg Oral Daily Abigail Miyamoto, MD   30 mg at 06/27/18 0947  . losartan (COZAAR) tablet 75 mg  75 mg Oral Daily Laqueta Linden, MD   75 mg at 06/27/18 0947  . menthol-cetylpyridinium (CEPACOL) lozenge 3 mg  1 lozenge Oral PRN Rhetta Mura, MD   3 mg at 06/26/18 1351  . ondansetron (ZOFRAN) tablet 4 mg  4 mg Oral Q6H PRN Abigail Miyamoto, MD       Or  . ondansetron [facility]) injection 4 mg  4 mg Intravenous Q6H PRN Abigail Miyamoto, MD      . potassium chloride SA (K-DUR,KLOR-CON) CR tablet 40 mEq  40 mEq Oral Daily Abigail Miyamoto, MD   40 mEq at  06/27/18 0947  . predniSONE (DELTASONE) tablet 10 mg  10 mg Oral Q breakfast Rhetta Mura, MD   10 mg at 06/27/18 0950  . traMADol (ULTRAM) tablet 50 mg  50 mg Oral Q6H PRN Abigail Miyamoto, MD         Discharge Medications: Please see discharge summary for a list of discharge medications.  Relevant Imaging Results:  Relevant Lab Results:   Additional Information SS#: [id number]. Was at [facility] 7/27-8/9.  Margarito Liner, LCSW

## 2018-06-27 NOTE — Care Management Important Message (Signed)
Important Message  Patient Details  Name: [name] [name] MRN: [id number] Date of Birth: 24-Dec-1929   Medicare Important Message Given:  Yes    Reana Chacko P Zuriel Roskos 06/27/2018, 1:48 PM

## 2018-06-27 NOTE — Discharge Instructions (Signed)

## 2018-06-27 NOTE — Consult Note (Signed)
   [facility] CM Inpatient Consult   06/27/2018  [name] [name] 03/18/1930 [id number]    Met with the patient at the bedside regarding restarting [place] [employer] services for transitional needs from her skilled nursing facility at [facility] for her Short term rehab. Patient is a s/p lap choley She states, "you can talk to my daughter who takes care of all of that stuff."  Left a brochure with contact information and called daughter Tomasa Hosteller [place] at both listed phone numbers and left a HIPAA appropriate voicemail requesting a return call. Patient to continue rehab at [facility].  Natividad Brood, RN BSN [facility] Liaison  [phone] business mobile phone Toll free office [phone]

## 2018-06-27 NOTE — Clinical Social Work Placement (Signed)
   CLINICAL SOCIAL WORK PLACEMENT  NOTE  Date:  06/27/2018  Patient Details  Name: [name] [name] MRN: [id number] Date of Birth: 1930/06/23  Clinical Social Work is seeking post-discharge placement for this patient at the Skilled  Nursing Facility level of care (*CSW will initial, date and re-position this form in  chart as items are completed):      Patient/family provided with [facility] Clinical Social Work Department's list of facilities offering this level of care within the geographic area requested by the patient (or if unable, by the patient's family).      Patient/family informed of their freedom to choose among providers that offer the needed level of care, that participate in Medicare, Medicaid or managed care program needed by the patient, have an available bed and are willing to accept the patient.      Patient/family informed of [facility]'s ownership interest in [facility] and [facility], as well as of the fact that they are under no obligation to receive care at these facilities.  PASRR submitted to EDS on 06/27/18     PASRR number received on       Existing PASRR number confirmed on 06/27/18     FL2 transmitted to all facilities in geographic area requested by pt/family on 06/27/18     FL2 transmitted to all facilities within larger geographic area on       Patient informed that his/her managed care company has contracts with or will negotiate with certain facilities, including the following:            Patient/family informed of bed offers received.  Patient chooses bed at       Physician recommends and patient chooses bed at      Patient to be transferred to   on  .  Patient to be transferred to facility by       Patient family notified on   of transfer.  Name of family member notified:        PHYSICIAN Please sign FL2     Additional Comment:    _______________________________________________ Margarito Liner, LCSW 06/27/2018, 10:22  AM

## 2018-06-28 ENCOUNTER — Encounter: Payer: Self-pay | Admitting: Internal Medicine

## 2018-06-28 ENCOUNTER — Other Ambulatory Visit: Payer: Self-pay

## 2018-06-28 ENCOUNTER — Non-Acute Institutional Stay (SKILLED_NURSING_FACILITY): Payer: Medicare Other | Admitting: Internal Medicine

## 2018-06-28 DIAGNOSIS — M150 Primary generalized (osteo)arthritis: Secondary | ICD-10-CM

## 2018-06-28 DIAGNOSIS — M1A09X0 Idiopathic chronic gout, multiple sites, without tophus (tophi): Secondary | ICD-10-CM

## 2018-06-28 DIAGNOSIS — D62 Acute posthemorrhagic anemia: Secondary | ICD-10-CM

## 2018-06-28 DIAGNOSIS — K819 Cholecystitis, unspecified: Secondary | ICD-10-CM

## 2018-06-28 DIAGNOSIS — K8000 Calculus of gallbladder with acute cholecystitis without obstruction: Secondary | ICD-10-CM

## 2018-06-28 DIAGNOSIS — I420 Dilated cardiomyopathy: Secondary | ICD-10-CM

## 2018-06-28 DIAGNOSIS — E1121 Type 2 diabetes mellitus with diabetic nephropathy: Secondary | ICD-10-CM

## 2018-06-28 DIAGNOSIS — I5023 Acute on chronic systolic (congestive) heart failure: Secondary | ICD-10-CM

## 2018-06-28 DIAGNOSIS — D638 Anemia in other chronic diseases classified elsewhere: Secondary | ICD-10-CM

## 2018-06-28 DIAGNOSIS — I130 Hypertensive heart and chronic kidney disease with heart failure and stage 1 through stage 4 chronic kidney disease, or unspecified chronic kidney disease: Secondary | ICD-10-CM

## 2018-06-28 DIAGNOSIS — M8949 Other hypertrophic osteoarthropathy, multiple sites: Secondary | ICD-10-CM

## 2018-06-28 DIAGNOSIS — M159 Polyosteoarthritis, unspecified: Secondary | ICD-10-CM

## 2018-06-28 DIAGNOSIS — M0579 Rheumatoid arthritis with rheumatoid factor of multiple sites without organ or systems involvement: Secondary | ICD-10-CM | POA: Diagnosis not present

## 2018-06-28 DIAGNOSIS — N183 Chronic kidney disease, stage 3 unspecified: Secondary | ICD-10-CM

## 2018-06-28 NOTE — Progress Notes (Signed)
:   Location:  [facility] Room Number: [phone] Place of Service:  SNF (31)  Randon Goldsmith. Lyn Hollingshead, MD  Patient Care Team: Helane Rima, DO as PCP - General (Family Medicine) Chilton Si, MD as PCP - Cardiology (Cardiology) Saporito, Fanny Dance, LCSW as [facility] Management  Extended Emergency Contact Information Primary Emergency Contact: Hampton,Jeannette Address: [address]          [place], [place] [address] D[place] of [place] Home Phone: [phone] Mobile Phone: [phone] Relation: Daughter Secondary Emergency Contact: [name] Address: [address]          [place], [place] [address] [place] of [place] Mobile Phone: [phone] Relation: Daughter     Allergies: Clonidine derivatives; Fish allergy; Shellfish allergy; Doxycycline; Indomethacin; Lyrica [pregabalin]; Methyldopa; Orange fruit [citrus]; Cetirizine hcl; Codeine; Levaquin [levofloxacin in d5w]; and Tomato  Chief Complaint  Patient presents with  . Readmit To SNF    Admit to [facility]    HPI: Patient is 84 y.o. female with chronic systolic heart failure, EF last month 20-25%, hypertension, diabetes mellitus, rheumatoid arthritis who was recently admitted for acute congestive heart failure and discharged to rehab and who had gone home last week and started experiencing nausea with abdominal pain for the last 3 days.  In the ER patient's lipase was elevated along with elevated AST and ALT and total bilirubin.  Sonogram of the abdomen showed gallstones with possible CBD dilatation concerning for possible choledocholithiasis.  Patient was admitted to [facility] from 8/14-19.  Patient was in mild congestive heart failure and received 1 dose of IV Lasix in the ED and another dose of IV Lasix prior to surgery gallbladder removal.  There were no apparent complications.  Methotrexate is on hold onto the patient follows up with her  regular rheumatologist in 1 to 2 weeks.  Per cardiology Aldactone was stopped and Lasix and losartan dosing were changed.  Patient is admitted to skilled nursing facility for OT/PT.  While at skilled nursing facility patient will be followed for dilated cardiomyopathy treated with Coreg Lasix hydralazine and losartan, diabetes mellitus treated with metformin and hypertension treated with Coreg Lasix hydralazine M. Doerr and losartan.  Past Medical History:  Diagnosis Date  . Allergic rhinitis due to pollen 04/27/2007  . Arthritis    "in q joint" (08/07/2013)  . CHF (congestive heart failure) (HCC)   . Coronary atherosclerosis of native coronary artery   . Cough   . Disorder of bone and cartilage, unspecified   . Edema 05/03/2013  . Exertional shortness of breath    "sometimes" (08/07/2013)  . First degree atrioventricular block   . Gout, unspecified 04/19/2013  . Hyperlipidemia 04/27/2007  . Hypertension   . Muscle spasm   . Muscle weakness (generalized)   . Myocardial infarction (HCC) ~ 1970  . Onychia and paronychia of toe   . Osteoarthrosis, unspecified whether generalized or localized, unspecified site 04/27/2007  . Other fall   . Other malaise and fatigue   . Pneumonia 05/2018  . Prepatellar bursitis   . Seizures (HCC)   . Stroke (HCC) 01/17/2014  . TIA (transient ischemic attack)    "a few one summer" (08/07/2013)  . Type II diabetes mellitus (HCC)    "fasting 90-110s" (08/07/2013)  . Unspecified essential hypertension 08/08/2013  . Unspecified vitamin D deficiency     Past Surgical History:  Procedure Laterality Date  . ABDOMINAL HYSTERECTOMY  04/1980  . APPENDECTOMY  1960  . CARDIAC CATHETERIZATION  2003  . CATARACT EXTRACTION W/ INTRAOCULAR LENS  IMPLANT, BILATERAL Bilateral ~ 2012  . CHOLECYSTECTOMY N/A 06/26/2018   Procedure: LAPAROSCOPIC CHOLECYSTECTOMY POSSIBLE INTRAOPERATIVE CHOLANGIOGRAM;  Surgeon: Abigail Miyamoto, MD;  Location: [facility] OR;  Service: General;   Laterality: N/A;  . JOINT REPLACEMENT    . REPLACEMENT TOTAL KNEE Right 09/2005  . RIGHT/LEFT HEART CATH AND CORONARY ANGIOGRAPHY N/A 06/01/2018   Procedure: RIGHT/LEFT HEART CATH AND CORONARY ANGIOGRAPHY;  Surgeon: Corky Crafts, MD;  Location: [facility] INVASIVE CV LAB;  Service: Cardiovascular;  Laterality: N/A;  . SHOULDER OPEN ROTATOR CUFF REPAIR Right 07/1999  . TONSILLECTOMY  08/1974  . TOTAL KNEE ARTHROPLASTY Left 08/07/2013   Procedure: TOTAL KNEE ARTHROPLASTY- LEFT;  Surgeon: Dannielle Huh, MD;  Location: [facility] OR;  Service: Orthopedics;  Laterality: Left;  . TRANSTHORACIC ECHOCARDIOGRAM  2003   EF 55-65%; mild concentric LVH    Allergies as of 06/28/2018      Reactions   Clonidine Derivatives Swelling   [name]   Fish Allergy Diarrhea, Swelling, Other (See Comments)   Turns skin "black," but can tolerate white fish Salmon- Diarrhea   Shellfish Allergy Hives   Doxycycline Rash   Indomethacin Other (See Comments)   Reaction not recalled by the patient   Lyrica [pregabalin] Other (See Comments)   Hallucinations   Methyldopa Other (See Comments)   Aldomet (for hypertension): Reaction not recalled by the patient   Erskine Emery Fruit [citrus] Other (See Comments)   Indigestion/heartburn   Cetirizine Hcl Itching, Rash   Codeine Itching   Levaquin [levofloxacin In D5w] Rash   Tomato Rash      Medication List        Accurate as of 06/28/18  2:02 PM. Always use your most recent med list.          albuterol 108 (90 Base) MCG/ACT inhaler Commonly known as:  PROVENTIL HFA;VENTOLIN HFA Inhale 1 puff into the lungs every 6 (six) hours as needed for wheezing or shortness of breath.   allopurinol 100 MG tablet Commonly known as:  ZYLOPRIM Take 100 mg by mouth daily.   aspirin EC 81 MG tablet Take 81 mg by mouth daily.   carvedilol 25 MG tablet Commonly known as:  COREG Take 1 tablet (25 mg total) by mouth 2  (two) times daily with a meal.   D3-1000 1000 units capsule Generic drug:  Cholecalciferol Take 1,000 Units by mouth daily.   diclofenac sodium 1 % Gel Commonly known as:  VOLTAREN Apply 1 application topically daily.   ferrous sulfate 325 (65 FE) MG tablet Take 1 tablet (325 mg total) by mouth 2 (two) times daily with a meal.   furosemide 40 MG tablet Commonly known as:  LASIX Take 1 tablet (40 mg total) by mouth daily.   gabapentin 100 MG capsule Commonly known as:  NEURONTIN Take 1 capsule (100 mg total) by mouth at bedtime.   hydrALAZINE 100 MG tablet Commonly known as:  APRESOLINE TAKE 1 TABLET BY MOUTH THREE TIMES DAILY   hydroxypropyl methylcellulose / hypromellose 2.5 % ophthalmic solution Commonly known as:  ISOPTO TEARS / GONIOVISC Place 1 drop into both eyes 4 (four) times daily as needed for dry eyes.   isosorbide mononitrate 30 MG 24 hr tablet Commonly known as:  IMDUR Take 1 tablet (30 mg total) by mouth daily.   losartan 25 MG tablet Commonly known as:  COZAAR Take 3 tablets (  75 mg total) by mouth daily.   metFORMIN 500 MG tablet Commonly known as:  GLUCOPHAGE Take 1 tablet (500 mg total) by mouth daily with breakfast.   Olopatadine HCl 0.2 % Soln Apply 1 drop to eye daily.   potassium chloride SA 20 MEQ tablet Commonly known as:  K-DUR,KLOR-CON Take 2 tablets (40 mEq total) by mouth daily.   predniSONE 10 MG tablet Commonly known as:  DELTASONE Take 1 tablet (10 mg total) by mouth daily with breakfast.   traMADol 50 MG tablet Commonly known as:  ULTRAM Take 2 tablets (100 mg total) by mouth every 6 (six) hours as needed for up to 5 days for moderate pain or severe pain.   triamcinolone cream 0.1 % Commonly known as:  KENALOG Apply 1 application topically 2 (two) times daily.       No orders of the defined types were placed in this encounter.   Immunization History  Administered Date(s) Administered  . Influenza Whole 09/06/2012  .  Influenza, High Dose Seasonal PF 09/13/2017  . Influenza,inj,Quad PF,6+ Mos 09/12/2013, 09/11/2014, 07/23/2015, 09/16/2016  . Pneumococcal Polysaccharide-23 04/13/2005, 11/09/2009, 09/12/2015  . Td 04/13/2005  . Zoster 11/10/2011    Social History   Tobacco Use  . Smoking status: Never Smoker  . Smokeless tobacco: Never Used  Substance Use Topics  . Alcohol use: No    Alcohol/week: 0.0 standard drinks    Family history is   Family History  Problem Relation Age of Onset  . Heart attack Father       Review of Systems  DATA OBTAINED: from patient, nurse GENERAL:  no fevers, fatigue, appetite changes SKIN: No itching, or rash EYES: No eye pain, redness, discharge EARS: No earache, tinnitus, change in hearing NOSE: No congestion, drainage or bleeding  MOUTH/THROAT: No mouth or tooth pain, No sore throat RESPIRATORY: No cough, wheezing, SOB CARDIAC: No chest pain, palpitations, lower extremity edema  GI: No abdominal pain, No N/V/D or constipation, No heartburn or reflux  GU: No dysuria, frequency or urgency, or incontinence  MUSCULOSKELETAL: No unrelieved bone/joint pain NEUROLOGIC: No headache, dizziness or focal weakness PSYCHIATRIC: No c/o anxiety or sadness   Vitals:   06/28/18 1352  BP: (!) 145/79  Pulse: 82  Resp: 20  Temp: (!) 97 F (36.1 C)    SpO2 Readings from Last 1 Encounters:  06/27/18 99%   Body mass index is 29.52 kg/m.     Physical Exam  GENERAL APPEARANCE: Alert, conversant,  No acute distress.  SKIN: No diaphoresis rash HEAD: Normocephalic, atraumatic  EYES: Conjunctiva/lids clear. Pupils round, reactive. EOMs intact.  EARS: External exam WNL, canals clear. Hearing grossly normal.  NOSE: No deformity or discharge.  MOUTH/THROAT: Lips w/o lesions  RESPIRATORY: Breathing is even, unlabored. Lung sounds are clear   CARDIOVASCULAR: Heart RRR no murmurs, rubs or gallops. No peripheral edema.   GASTROINTESTINAL: Abdomen is soft, non-tender,  not distended w/ normal bowel sounds; 2 incision sites look clean. GENITOURINARY: Bladder non tender, not distended  MUSCULOSKELETAL: Joints in hand consistent with rheumatoid arthritis NEUROLOGIC:  Cranial nerves 2-12 grossly intact. Moves all extremities  PSYCHIATRIC: Mood and affect appropriate to situation, no behavioral issues  Patient Active Problem List   Diagnosis Date Noted  . Cerebral thrombosis with cerebral infarction 06/24/2018  . Abdominal pain 06/23/2018  . Cholelithiasis 06/23/2018  . Elevated LFTs 06/23/2018  . Acute pulmonary edema (HCC)   . Cholecystitis   . Uncontrolled hypertension 06/18/2018  . Acute metabolic encephalopathy 06/18/2018  .  Acute kidney injury superimposed on chronic kidney disease (HCC) 06/18/2018  . Pneumonia of left upper lobe due to infectious organism (HCC)   . Elevated troponin   . DCM (dilated cardiomyopathy) (HCC)   . Benign hypertensive heart and kidney disease with CHF and stage 3 chronic kidney disease (HCC) 05/28/2018  . Degenerative joint disease involving multiple joints 05/06/2018  . Adhesive capsulitis of right shoulder 10/12/2017  . Idiopathic chronic gout of multiple sites with tophus 08/25/2017  . Rheumatic nodule 06/28/2017  . History of total knee arthroplasty, bilateral 02/18/2017  . History of rotator cuff surgery 02/18/2017  . Obesity (BMI 30.0-34.9) 12/08/2016  . High risk medications (not anticoagulants) long-term use 09/21/2016  . Hammer toe 10/31/2015  . Acute combined systolic and diastolic heart failure (HCC) 09/12/2015  . Midline low back pain without sciatica 06/27/2014  . Bilateral edema of lower extremity 06/05/2014  . CKD stage 3 due to type 2 diabetes mellitus (HCC) 06/05/2014  . DM (diabetes mellitus), type 2 with renal complications (HCC) 06/05/2014    Class: Chronic  . Anemia of chronic disease 05/01/2014  . Fluctuating blood pressure 02/06/2014  . Stroke (HCC) 01/17/2014  . Rheumatoid arthritis  involving multiple sites (HCC) 12/19/2013    Class: Chronic  . CAD (coronary artery disease) 10/18/2013  . Fibromyalgia 05/10/2013  . Reactive airway disease 04/19/2013  . Hyperlipidemia 04/27/2007  . Allergic rhinitis 04/27/2007  . Diverticulosis 04/27/2007      Labs reviewed: Basic Metabolic Panel:    Component Value Date/Time   NA 140 06/27/2018 0630   NA 138 06/15/2018 1122   K 3.5 06/27/2018 0630   CL 104 06/27/2018 0630   CO2 26 06/27/2018 0630   GLUCOSE 108 (H) 06/27/2018 0630   BUN 17 06/27/2018 0630   BUN 32 (H) 06/15/2018 1122   CREATININE 1.10 (H) 06/27/2018 0630   CREATININE 1.19 (H) 07/21/2017 1454   CALCIUM 8.7 (L) 06/27/2018 0630   PROT 6.5 06/26/2018 0445   PROT 7.2 09/09/2015 1210   ALBUMIN 2.9 (L) 06/27/2018 0630   ALBUMIN 3.9 09/09/2015 1210   AST 24 06/26/2018 0445   ALT 80 (H) 06/26/2018 0445   ALKPHOS 161 (H) 06/26/2018 0445   BILITOT 0.9 06/26/2018 0445   BILITOT 0.5 09/09/2015 1210   GFRNONAA 44 (L) 06/27/2018 0630   GFRNONAA 42 (L) 07/21/2017 1454   GFRAA 51 (L) 06/27/2018 0630   GFRAA 48 (L) 07/21/2017 1454    Recent Labs    11/28/17 0809  05/29/18 0030  05/30/18 0449  06/01/18 0459  06/25/18 0607 06/26/18 0445 06/27/18 0630  NA 134*   < >  --    < > 143   < > 141  140   < > 138 140 140  K 4.5   < >  --    < > 4.1   < > 3.5  3.5   < > 3.1* 3.6 3.5  CL 107   < >  --    < > 108   < > 108  107   < > 101 105 104  CO2 21*   < >  --    < > 25   < > 26  27   < > 25 27 26   GLUCOSE 132*   < >  --    < > 94   < > 109*  110*   < > 144* 143* 108*  BUN 33*   < >  --    < >  24*   < > 22  21   < > 14 15 17   CREATININE 1.05*   < >  --    < > 1.31*   < > 1.00  0.98   < > 1.15* 1.11* 1.10*  CALCIUM 8.7*   < >  --    < > 8.7*   < > 8.9  8.9   < > 9.0 9.0 8.7*  MG 2.7*  --  1.7  --  1.7  --   --   --   --   --   --   PHOS  --   --   --   --   --   --  2.7  --   --   --  2.7   < > = values in this interval not displayed.   Liver Function  Tests: Recent Labs    06/24/18 0524 06/25/18 0607 06/26/18 0445 06/27/18 0630  AST 43* 27 24  --   ALT 140* 104* 80*  --   ALKPHOS 225* 184* 161*  --   BILITOT 0.7 0.9 0.9  --   PROT 6.2* 6.6 6.5  --   ALBUMIN 3.0* 3.0* 3.0* 2.9*   Recent Labs    06/22/18 2153 06/23/18 0500  LIPASE 150* 77*   No results for input(s): AMMONIA in the last 8760 hours. CBC: Recent Labs    06/25/18 0607 06/26/18 0445 06/27/18 0630  WBC 7.4 8.0 11.9*  NEUTROABS 5.4 6.2 8.9*  HGB 11.0* 11.1* 10.5*  HCT 33.6* 34.2* 32.7*  MCV 89.6 89.5 92.1  PLT 290 263 237   Lipid Recent Labs    05/29/18 0030 06/24/18 0524 06/25/18 0607  CHOL 178 189 202*  HDL 73 63 63  LDLCALC 92 113* 126*  TRIG 64 65 67    Cardiac Enzymes: Recent Labs    06/23/18 0500 06/23/18 1015 06/23/18 1628  TROPONINI 0.03* 0.03* 0.03*   BNP: Recent Labs    05/28/18 1614 06/22/18 2153  BNP 2,077.6* 1,146.7*   No results found for: [facility] Lab Results  Component Value Date   HGBA1C 5.9 (H) 06/25/2018   Lab Results  Component Value Date   TSH 1.742 06/23/2018   Lab Results  Component Value Date   VITAMINB12 612 06/08/2018   Lab Results  Component Value Date   FOLATE >19.9 07/03/2014   Lab Results  Component Value Date   IRON 19 06/08/2018   TIBC 183 06/08/2018   FERRITIN 184.10 06/08/2018    Imaging and Procedures obtained prior to SNF admission: Dg Chest 2 View  Result Date: 06/22/2018 CLINICAL DATA:  Chest pain and abdominal pain EXAM: CHEST - 2 VIEW COMPARISON:  06/01/2018 FINDINGS: Cardiac shadow is again enlarged but stable. Mild vascular congestion is noted without significant edema. No focal infiltrate is seen. No acute bony abnormality is noted. Chronic changes about both shoulder joints are seen. IMPRESSION: Cardiomegaly and mild congestive changes. Electronically Signed   By: Alcide Clever [name]   On: 06/22/2018 22:26   US Abdomen Complete  Result Date: 06/23/2018 CLINICAL DATA:   Abdominal pain for 3 days. History of appendectomy, hysterectomy. EXAM: ABDOMEN ULTRASOUND COMPLETE COMPARISON:  Abdominal ultrasound November 30, 2016 and CT abdomen and pelvis September 11, 2015. FINDINGS: Gallbladder: Mild distended gallbladder with punctate echogenic layering gallbladder sludge and cholelithiasis measuring to 4 mm. No gallbladder wall thickening or pericholecystic fluid. No sonographic Murphy sign elicited. Common bile duct: Diameter: Dilated common bile duct measuring to  15 mm with suspected sludge. Liver: No focal lesion identified. Mild intrahepatic biliary dilatation. Within normal limits in parenchymal echogenicity. Portal vein is patent on color Doppler imaging with normal direction of blood flow towards the liver. IVC: No abnormality visualized. Pancreas: Visualized portion unremarkable. Spleen: Size and appearance within normal limits. Right Kidney: Length: 10.5 cm. Mildly increased cortical echogenicity. No solid mass or hydronephrosis visualized. 5.2 cm homogeneously anechoic cyst upper pole RIGHT kidney. Left Kidney: Length: 11.4 cm. Mildly increased cortical echogenicity. No solid mass or hydronephrosis visualized. Abdominal aorta: No aneurysm visualized. Echogenic calcific atherosclerosis. Other findings: None. IMPRESSION: 1. Cholelithiasis and sludge without sonographic findings of acute cholecystitis. 2. Biliary dilatation with potential choledocholithiasis/sludge. MRCP may be more definitive. 3. Mildly echogenic kidneys seen with medical renal disease. Aortic Atherosclerosis (ICD10-I70.0). Electronically Signed   By: Awilda Metro [name]   On: 06/23/2018 00:37   Mr Abdomen Mrcp Wo Contrast  Result Date: 06/23/2018 CLINICAL DATA:  Epigastric pain. Elevated LFTs a gallstone. Concern for common bile duct stone. EXAM: MRI ABDOMEN WITHOUT CONTRAST  (INCLUDING MRCP) TECHNIQUE: Multiplanar multisequence MR imaging of the abdomen was performed. Heavily T2-weighted images of the  biliary and pancreatic ducts were obtained, and three-dimensional MRCP images were rendered by post processing. COMPARISON:  Ultrasound 06/22/2018 FINDINGS: Lower chest:  Lung bases are clear. Hepatobiliary: No intrahepatic biliary duct dilatation. Gallbladder nondistended. Sludge in gallstones described on comparison ultrasound are difficult to define. No intrahepatic duct dilatation. The common hepatic duct measures 14 mm. Duct tapers to 9 mm through the common bile duct. Duct measures 9 mm at the ampullary region. No evidence clear stone within the common bile duct. There is some dependent material within the common bile duct through the pancreatic head region (image 24/8). This may represent sludge. No obstructing lesion at the ampulla. Potential sludge within the distal common bile duct (image 25/8) layers dependently. Pancreas: Pancreatic duct is nondilated. No pancreatic inflammation. Spleen: Normal spleen. Adrenals/urinary tract: Adrenal glands normal. Benign-appearing cyst of the RIGHT kidney Stomach/Bowel: Stomach and limited of the small bowel is unremarkable Vascular/Lymphatic: Abdominal aortic normal caliber. No retroperitoneal periportal lymphadenopathy. Musculoskeletal: No aggressive osseous lesion IMPRESSION: 1. Mild dilatation of the common hepatic duct and common bile duct. No obstructing lesion is identified. Small amount sludge layers dependently within the common bile duct. 2. No gallstones evident. Gallstones and sludge identified on comparison ultrasound. 3. No intrahepatic duct dilatation. 4. No pancreatic inflammation Electronically Signed   By: Genevive Bi [name]   On: 06/23/2018 12:22   Mr 3d Recon At Scanner  Result Date: 06/23/2018 CLINICAL DATA:  Epigastric pain. Elevated LFTs a gallstone. Concern for common bile duct stone. EXAM: MRI ABDOMEN WITHOUT CONTRAST  (INCLUDING MRCP) TECHNIQUE: Multiplanar multisequence MR imaging of the abdomen was performed. Heavily T2-weighted images  of the biliary and pancreatic ducts were obtained, and three-dimensional MRCP images were rendered by post processing. COMPARISON:  Ultrasound 06/22/2018 FINDINGS: Lower chest:  Lung bases are clear. Hepatobiliary: No intrahepatic biliary duct dilatation. Gallbladder nondistended. Sludge in gallstones described on comparison ultrasound are difficult to define. No intrahepatic duct dilatation. The common hepatic duct measures 14 mm. Duct tapers to 9 mm through the common bile duct. Duct measures 9 mm at the ampullary region. No evidence clear stone within the common bile duct. There is some dependent material within the common bile duct through the pancreatic head region (image 24/8). This may represent sludge. No obstructing lesion at the ampulla. Potential sludge within the distal common bile duct (  image 25/8) layers dependently. Pancreas: Pancreatic duct is nondilated. No pancreatic inflammation. Spleen: Normal spleen. Adrenals/urinary tract: Adrenal glands normal. Benign-appearing cyst of the RIGHT kidney Stomach/Bowel: Stomach and limited of the small bowel is unremarkable Vascular/Lymphatic: Abdominal aortic normal caliber. No retroperitoneal periportal lymphadenopathy. Musculoskeletal: No aggressive osseous lesion IMPRESSION: 1. Mild dilatation of the common hepatic duct and common bile duct. No obstructing lesion is identified. Small amount sludge layers dependently within the common bile duct. 2. No gallstones evident. Gallstones and sludge identified on comparison ultrasound. 3. No intrahepatic duct dilatation. 4. No pancreatic inflammation Electronically Signed   By: Genevive Bi [name]   On: 06/23/2018 12:22     Not all labs, radiology exams or other studies done during hospitalization come through on my EPIC note; however they are reviewed by me.    Assessment and Plan  Cholecystitis/cholecystectomy- no problems; continue perioperative aspirin SNF -admitted for OT/PT continue aspirin 81 mg  daily  Rheumatoid arthritis/osteoarthritis SNF -continue prednisone 10 mg daily; continue to hold methotrexate until follow-up with rheumatology  Dilated cardiomyopathy/acute on chronic congestive heart failure- resolved with IV Lasix;: Aldactone was discontinued, and losartan was decreased SNF -continue Coreg 25 mg twice daily, hydralazine 100 mg 3 times daily, indoor 30 mg daily losartan 75 mg daily and Lasix 40 mg daily: Discharge weight 78 pounds  Diabetes mellitus type 2 SNF -patient placed back on metformin 500 mg every morning patient is on ARB; blood sugars will be checked every morning  Anemia of chronic disease/postop anemia SNF -hemoglobin fell from 11.1-10.5, no need for transfusion; continue iron 325 mg twice daily; follow-up CBC  Hypertension SNF -tinea Coreg 25 mg twice daily, Lasix 40 mg daily, hydralazine 100 mg 3 times daily, Imdur 30 mg daily, losartan 75 mg daily  Gout SNF -not stated as uncontrolled; continue allopurinol 100 mg daily   Time spent greater than 45 minutes;> 50% of time with patient was spent reviewing records, labs, tests and studies, counseling and developing plan of care  Thurston Hole D. Lyn Hollingshead, MD

## 2018-06-28 NOTE — Consult Note (Signed)
Late entry:  06/27/18 1545 A return call was received from patient's daughter, Erick Alley, from [phone], HIPAA verified.  Explained [facility] services for patient with short term rehab and to assist with transitional needs for returning home.  After explaining daughter states, "We certainly need all of the help we can get. I look forward the help."  Will have a [facility] social worker assigned to assist with transitions to community as appropriate.  For questions, please contact:  Charlesetta Shanks, RN BSN CCM [facility]  [phone] business mobile phone Toll free office [phone]

## 2018-06-29 ENCOUNTER — Ambulatory Visit: Payer: Self-pay | Admitting: Licensed Clinical Social Worker

## 2018-06-30 ENCOUNTER — Other Ambulatory Visit: Payer: Self-pay | Admitting: Licensed Clinical Social Worker

## 2018-06-30 ENCOUNTER — Ambulatory Visit: Payer: Medicare Other | Admitting: Podiatry

## 2018-06-30 NOTE — Patient Outreach (Signed)
[facility]) Care Management  [facility] Social Work  06/30/2018  [name] [name] 26-Oct-1930 [id number]  Encounter Medications:  Outpatient Encounter Medications as of 06/30/2018  Medication Sig  . albuterol (PROVENTIL HFA;VENTOLIN HFA) 108 (90 Base) MCG/ACT inhaler Inhale 1 puff into the lungs every 6 (six) hours as needed for wheezing or shortness of breath.  . allopurinol (ZYLOPRIM) 100 MG tablet Take 100 mg by mouth daily.   Marland Kitchen aspirin EC 81 MG tablet Take 81 mg by mouth daily.  . carvedilol (COREG) 25 MG tablet Take 1 tablet (25 mg total) by mouth 2 (two) times daily with a meal.  . D3-1000 1000 units capsule Take 1,000 Units by mouth daily.  . diclofenac sodium (VOLTAREN) 1 % GEL Apply 1 application topically daily.  . ferrous sulfate 325 (65 FE) MG tablet Take 1 tablet (325 mg total) by mouth 2 (two) times daily with a meal.  . furosemide (LASIX) 40 MG tablet Take 1 tablet (40 mg total) by mouth daily.  Marland Kitchen gabapentin (NEURONTIN) 100 MG capsule Take 1 capsule (100 mg total) by mouth at bedtime.  . hydrALAZINE (APRESOLINE) 100 MG tablet TAKE 1 TABLET BY MOUTH THREE TIMES DAILY  . hydroxypropyl methylcellulose / hypromellose (ISOPTO TEARS / GONIOVISC) 2.5 % ophthalmic solution Place 1 drop into both eyes 4 (four) times daily as needed for dry eyes.  . isosorbide mononitrate (IMDUR) 30 MG 24 hr tablet Take 1 tablet (30 mg total) by mouth daily.  Marland Kitchen losartan (COZAAR) 25 MG tablet Take 3 tablets (75 mg total) by mouth daily.  . metFORMIN (GLUCOPHAGE) 500 MG tablet Take 1 tablet (500 mg total) by mouth daily with breakfast.  . Olopatadine HCl 0.2 % SOLN Apply 1 drop to eye daily.  . potassium chloride SA (K-DUR,KLOR-CON) 20 MEQ tablet Take 2 tablets (40 mEq total) by mouth daily.  . predniSONE (DELTASONE) 10 MG tablet Take 1 tablet (10 mg total) by mouth daily with breakfast.  . traMADol (ULTRAM) 50 MG tablet Take 2 tablets (100 mg total) by mouth every 6 (six) hours as needed  for up to 5 days for moderate pain or severe pain.  Marland Kitchen triamcinolone cream (KENALOG) 0.1 % Apply 1 application topically 2 (two) times daily.   No facility-administered encounter medications on file as of 06/30/2018.     Functional Status:  In your present state of health, do you have any difficulty performing the following activities: 06/23/2018 05/29/2018  Hearing? N -  Vision? N -  Difficulty concentrating or making decisions? Y -  Walking or climbing stairs? Y -  Dressing or bathing? Y -  Doing errands, shopping? [name]  Some recent data might be hidden    Fall/Depression Screening:  PHQ 2/9 Scores 06/28/2017 03/29/2017 03/04/2016 07/03/2015 11/27/2014 09/26/2014 07/11/2013  PHQ - 2 Score 0 0 0 0 0 0 0  PHQ- 9 Score - - - - - - -    Assessment: [facility] CSW received new referral and request to follow up with patient at [facility]. [facility] CSW arrived at SNF and met with social workers. SNF social workers report that patient was previously at [facility] and the family requested for patient to leave and once patient returned home she went back to the ED on 06/23/18 and was admitted. Patient returned back to [facility] on 06/27/18. No further updates at this time and no care team planning meeting has been scheduled as of now. [facility] CSW arrived at patient's room and  successfully introduced self, reason for visit and of [place]. Patient is agreeable to services but wishes to sign consent once she reviews it with her family. [facility] CSW expressed understanding and left [facility] consent form in her rom. Patient reports that she lives alone and pays out of pocket for aide services which include: Monday-Friday 9:00 am - 12:00 pm and Saturday-Sunday 10:00 am- 12:00 pm. Patient reports that her family is very involved in her care and that their plan at this time is to see if patient can be able to live independently at home after receiving PT at [facility]. Patient reports that she is willing to go to a nursing  facility only as a last resort to and would prefer to live in her home instead and feels that is able to. Patient reports that she feels as if she is getting better by the day and has made a lot of progress. Patient reports that she has one daughter that lives in [place] and one daughter that lives in [place]. She reports that they provide stable transportation for her and also get her weekly groceries. Patient reports that she has recently be put on many new medications and would like [place] assistance post SNF discharge. [facility] CSW completed call to patient's daughter but was unable to reach her successfully. HIPPA compliant voice message left encouraging family to return call to [facility] CSW and that this [facility] CSW will be out of the office starting tomorrow until 07/04/18 but that there will be a covering social worker if an emergency were to happen in my absence. [facility] CSW will follow up with patient within one week.  Plan: [facility] CSW will update covering [place] on case. [facility] CSW will follow up within one week.  Eula Fried, BSW, MSW, [email or web address] Phone: [phone] Fax: [phone]

## 2018-07-03 ENCOUNTER — Encounter: Payer: Self-pay | Admitting: Internal Medicine

## 2018-07-03 DIAGNOSIS — I509 Heart failure, unspecified: Secondary | ICD-10-CM | POA: Insufficient documentation

## 2018-07-04 ENCOUNTER — Other Ambulatory Visit: Payer: Self-pay | Admitting: *Deleted

## 2018-07-04 LAB — CBC AND DIFFERENTIAL
HCT: 33 — AB (ref 36–46)
Hemoglobin: 11.1 — AB (ref 12.0–16.0)
Platelets: 266 (ref 150–399)
WBC: 7.8

## 2018-07-04 LAB — BASIC METABOLIC PANEL
BUN: 40 — AB (ref 4–21)
Creatinine: 1.2 — AB (ref 0.5–1.1)
Glucose: 91
Potassium: 3.9 (ref 3.4–5.3)
Sodium: 142 (ref 137–147)

## 2018-07-04 NOTE — Patient Outreach (Signed)
[facility]) Care Management  07/04/2018  [name] [name] [birth year] [id number]   Post Acute Care coordination call to obtain update on patient's progress in rehab. This Child psychotherapist providing coverage for [employer]. In her absence.Discharge planner [name] [name] was not available. This Child psychotherapist spoke with [name] [name] who was not able to provide an update on patient's progress stating  that the care plan meeting is scheduled for tomorrow and will have  more information to provide on patient's progress at that time.   Plan: This Child psychotherapist will inform social worker [name] [name], [place].   [name] [name] [facility] Care Management [phone]

## 2018-07-06 ENCOUNTER — Other Ambulatory Visit: Payer: Self-pay | Admitting: Licensed Clinical Social Worker

## 2018-07-06 NOTE — Patient Outreach (Addendum)
[facility]) Care Management  [facility] Social Work  07/06/2018  RAYMOND AZURE 01/20/1930 [id number]  Encounter Medications:  Outpatient Encounter Medications as of 07/06/2018  Medication Sig  . albuterol (PROVENTIL HFA;VENTOLIN HFA) 108 (90 Base) MCG/ACT inhaler Inhale 1 puff into the lungs every 6 (six) hours as needed for wheezing or shortness of breath.  . allopurinol (ZYLOPRIM) 100 MG tablet Take 100 mg by mouth daily.   Marland Kitchen aspirin EC 81 MG tablet Take 81 mg by mouth daily.  . carvedilol (COREG) 25 MG tablet Take 1 tablet (25 mg total) by mouth 2 (two) times daily with a meal.  . D3-1000 1000 units capsule Take 1,000 Units by mouth daily.  . diclofenac sodium (VOLTAREN) 1 % GEL Apply 1 application topically daily.  . ferrous sulfate 325 (65 FE) MG tablet Take 1 tablet (325 mg total) by mouth 2 (two) times daily with a meal.  . furosemide (LASIX) 40 MG tablet Take 1 tablet (40 mg total) by mouth daily.  Marland Kitchen gabapentin (NEURONTIN) 100 MG capsule Take 1 capsule (100 mg total) by mouth at bedtime.  . hydrALAZINE (APRESOLINE) 100 MG tablet TAKE 1 TABLET BY MOUTH THREE TIMES DAILY  . hydroxypropyl methylcellulose / hypromellose (ISOPTO TEARS / GONIOVISC) 2.5 % ophthalmic solution Place 1 drop into both eyes 4 (four) times daily as needed for dry eyes.  . isosorbide mononitrate (IMDUR) 30 MG 24 hr tablet Take 1 tablet (30 mg total) by mouth daily.  Marland Kitchen losartan (COZAAR) 25 MG tablet Take 3 tablets (75 mg total) by mouth daily.  . metFORMIN (GLUCOPHAGE) 500 MG tablet Take 1 tablet (500 mg total) by mouth daily with breakfast.  . Olopatadine HCl 0.2 % SOLN Apply 1 drop to eye daily.  . potassium chloride SA (K-DUR,KLOR-CON) 20 MEQ tablet Take 2 tablets (40 mEq total) by mouth daily.  . predniSONE (DELTASONE) 10 MG tablet Take 1 tablet (10 mg total) by mouth daily with breakfast.  . triamcinolone cream (KENALOG) 0.1 % Apply 1 application topically 2 (two) times daily.   No  facility-administered encounter medications on file as of 07/06/2018.     Functional Status:  In your present state of health, do you have any difficulty performing the following activities: 06/23/2018 05/29/2018  Hearing? N -  Vision? N -  Difficulty concentrating or making decisions? Y -  Walking or climbing stairs? Y -  Dressing or bathing? Y -  Doing errands, shopping? [name] [name]  Some recent data might be hidden    Fall/Depression Screening:  PHQ 2/9 Scores 06/28/2017 03/29/2017 03/04/2016 07/03/2015 11/27/2014 09/26/2014 07/11/2013  PHQ - 2 Score 0 0 0 0 0 0 0  PHQ- 9 Score - - - - - - -    Assessment: [facility] CSW arrived at [facility] and successfully completed visit with patient and her daughter Lattie Corns. Patient was receiving PT in her room when [facility] CSW arrived. [name] physical therapist reports that patient has been making a lot of progress in therapy but that they had a care team planning meeting yesterday and do not feel as if she is ready to go home just yet. PT has been doing therapy in her room as it is more comfortable for her. Patient has been able to make progress with transferring in and out of her chair to places she is wanting to go (bathroom, bed, etc.) Patient reports having a 4 wheel rollator at home that is very helpful to her. Patient was able to walk  from her room door of her to air conditioning unit at the end side of the room without any issues today. Patient denies experiencing any pain or discomfort at this time. Daughter reports that once patient discharges back home from SNF that she will have someone with her at all times at least until they feel more comfortable with patient staying alone. Daughter is requesting [facility] RNCM and Pharmacy follow up post SNF discharge. Family states that they are looking at a possible SNF discharge next week. [facility] Care Management Services explained and reviewed with family and consent was signed today. [facility] CSW will follow up within one week.    Plan: [facility] CSW will continue to follow patient and will await for updates on expected SNF discharge.  [name], BSW, MSW, LCSW [email or web address] Phone: [phone] Fax: [phone]

## 2018-07-10 DIAGNOSIS — I2699 Other pulmonary embolism without acute cor pulmonale: Secondary | ICD-10-CM

## 2018-07-10 HISTORY — DX: Other pulmonary embolism without acute cor pulmonale: I26.99

## 2018-07-11 LAB — CBC AND DIFFERENTIAL
HCT: 32 — AB (ref 36–46)
Hemoglobin: 10.5 — AB (ref 12.0–16.0)
Platelets: 266 (ref 150–399)
WBC: 6.9

## 2018-07-12 ENCOUNTER — Other Ambulatory Visit: Payer: Self-pay | Admitting: Licensed Clinical Social Worker

## 2018-07-12 ENCOUNTER — Telehealth: Payer: Self-pay | Admitting: Family Medicine

## 2018-07-12 ENCOUNTER — Encounter: Payer: Self-pay | Admitting: Internal Medicine

## 2018-07-12 ENCOUNTER — Non-Acute Institutional Stay (SKILLED_NURSING_FACILITY): Payer: Medicare Other | Admitting: Internal Medicine

## 2018-07-12 DIAGNOSIS — E1121 Type 2 diabetes mellitus with diabetic nephropathy: Secondary | ICD-10-CM | POA: Diagnosis not present

## 2018-07-12 DIAGNOSIS — I5041 Acute combined systolic (congestive) and diastolic (congestive) heart failure: Secondary | ICD-10-CM | POA: Diagnosis not present

## 2018-07-12 DIAGNOSIS — G9341 Metabolic encephalopathy: Secondary | ICD-10-CM

## 2018-07-12 DIAGNOSIS — M159 Polyosteoarthritis, unspecified: Secondary | ICD-10-CM

## 2018-07-12 DIAGNOSIS — I10 Essential (primary) hypertension: Secondary | ICD-10-CM

## 2018-07-12 DIAGNOSIS — K819 Cholecystitis, unspecified: Secondary | ICD-10-CM

## 2018-07-12 DIAGNOSIS — I5023 Acute on chronic systolic (congestive) heart failure: Secondary | ICD-10-CM

## 2018-07-12 DIAGNOSIS — M1A09X0 Idiopathic chronic gout, multiple sites, without tophus (tophi): Secondary | ICD-10-CM

## 2018-07-12 DIAGNOSIS — M0579 Rheumatoid arthritis with rheumatoid factor of multiple sites without organ or systems involvement: Secondary | ICD-10-CM

## 2018-07-12 DIAGNOSIS — M8949 Other hypertrophic osteoarthropathy, multiple sites: Secondary | ICD-10-CM

## 2018-07-12 DIAGNOSIS — M150 Primary generalized (osteo)arthritis: Secondary | ICD-10-CM

## 2018-07-12 LAB — CBC AND DIFFERENTIAL
HCT: 34 — AB (ref 36–46)
Hemoglobin: 11.2 — AB (ref 12.0–16.0)
Platelets: 289 (ref 150–399)
WBC: 6.8

## 2018-07-12 NOTE — Telephone Encounter (Signed)
Scheduled patient for 07/25/18 at 2 PM

## 2018-07-12 NOTE — Patient Outreach (Addendum)
[facility]) Care Management  07/12/2018  SHAWNA WEARING 05/03/1930 [id number]  [facility] CSW arrived at [facility] SNF to complete [facility] Consult. Patient reports that she will be discharging back home tomorrow and is very excited to be back home. Patient reports that she is tolerating the food here and eating well but feels that being home will improve her appetite. Patient reports that she was even able to get out this weekend and go out to eat and receive fellowship for her sister's birthday. Patient denies any social work needs but is agreeable to [facility] and Pharmacy involvement. Patient encouraged to get an accurate updated medication list before discharge tomorrow. [facility] CSW met with SNF social worker and was informed that patient will discharge back home with [facility]: PT, OT and Nursing. [facility] CSW will sign off at this time and refer patient to [place] and Pharmacy to assist with a safe transition back home.   Eula Fried, BSW, MSW, [email or web address] Phone: [phone] Fax: [phone]

## 2018-07-12 NOTE — Telephone Encounter (Signed)
Can pt be worked in prior to 9/20? Please advise.   Copied from CRM [id number]. Topic: Appointment Scheduling - Scheduling Inquiry for Clinic >> Jul 12, 2018 12:05 PM Terisa Starr wrote: Reason for CRM: [name] [name] with [facility] called to schedule a follow up within one week with Dr Earlene Plater. There is nothing available until 9/20. Misty Stanley can be reached at [phone], just ask for her.

## 2018-07-12 NOTE — Progress Notes (Signed)
Location:  [facility] Room Number: [phone] Place of Service:  SNF [phone])  [name] [name]. Lyn Hollingshead, MD  Patient Care Team: Helane Rima, DO as PCP - General (Family Medicine) Chilton Si, MD as PCP - Cardiology (Cardiology) Alejandro Mulling, RN as [facility] Care Management Patricia Pesa, Lynita Lombard, [facility] as Pharmacist (Pharmacist)  Extended Emergency Contact Information Primary Emergency Contact: Hampton,Jeannette Address: [address]          [place], [place] [address] [place] of [place] Home Phone: [phone] Mobile Phone: [phone] Relation: Daughter Secondary Emergency Contact: [name] Address: [address]          [place], [place] [address] [place] of [place] Mobile Phone: [phone] Relation: Daughter  Allergies  Allergen Reactions  . Clonidine Derivatives Swelling    [name]  . Fish Allergy Diarrhea, Swelling and Other (See Comments)    Turns skin "black," but can tolerate white fish Salmon- Diarrhea  . Shellfish Allergy Hives  . Doxycycline Rash  . Indomethacin Other (See Comments)    Reaction not recalled by the patient  . Lyrica [Pregabalin] Other (See Comments)    Hallucinations   . Methyldopa Other (See Comments)    Aldomet (for hypertension): Reaction not recalled by the patient  . Orange Fruit [Citrus] Other (See Comments)    Indigestion/heartburn  . Strawberry (Diagnostic) Itching  . Cetirizine Hcl Itching and Rash  . Codeine Itching  . Levaquin [Levofloxacin In D5w] Rash  . Tomato Rash    Chief Complaint  Patient presents with  . Discharge Note    Discharge from [facility]    HPI:  84 y.o. female with chronic systolic congestive heart failure, EF last month 20-25%, hypertension, diabetes mellitus, rheumatoid arthritis who was recently admitted for acute congestive heart failure and discharged to  rehab and who had gone home last week and started experiencing nausea with abdominal pain for the last 3 days.  In the ER patient's lipase was elevated along with elevated AST and ALT and total bilirubin.  Sonogram of the abdomen showed gallstones with possible CBD dilatation concerning for possible choledocholithiasis.  Patient was admitted to [facility] from 8/14-19.  Patient was in mild congestive heart failure and received one 1 dose IV Lasix in the ED and another dose of IV Lasix prior to surgery for gallbladder removal.  There were no apparent complications.  Methotrexate was on hold due to the patient can follow-up with her regular rheumatologist in 1 to 2 weeks.  Per cardiology Aldactone was stopped and Lasix and losartan dosages were changed.  Patient was admitted to skilled nursing facility for OT/PT and is now ready to be discharged home.    Past Medical History:  Diagnosis Date  . Allergic rhinitis due to pollen 04/27/2007  . Arthritis    "in q joint" (08/07/2013)  . CHF (congestive heart failure) (HCC)   . Coronary atherosclerosis of native coronary artery   . Cough   . Disorder of bone and cartilage, unspecified   . Edema 05/03/2013  . Exertional shortness of breath    "sometimes" (08/07/2013)  . First degree atrioventricular block   . Gout, unspecified 04/19/2013  . Hyperlipidemia 04/27/2007  . Hypertension   . Muscle spasm   . Muscle weakness (generalized)   . Myocardial infarction (HCC) ~ 1970  . Onychia and paronychia of toe   . Osteoarthrosis, unspecified whether generalized or localized,  unspecified site 04/27/2007  . Other fall   . Other malaise and fatigue   . Pneumonia 05/2018  . Prepatellar bursitis   . Seizures (HCC)   . Stroke (HCC) 01/17/2014  . TIA (transient ischemic attack)    "a few one summer" (08/07/2013)  . Type II diabetes mellitus (HCC)    "fasting 90-110s" (08/07/2013)  . Unspecified essential hypertension 08/08/2013  . Unspecified vitamin  D deficiency     Past Surgical History:  Procedure Laterality Date  . ABDOMINAL HYSTERECTOMY  04/1980  . APPENDECTOMY  1960  . CARDIAC CATHETERIZATION  2003  . CATARACT EXTRACTION W/ INTRAOCULAR LENS  IMPLANT, BILATERAL Bilateral ~ 2012  . CHOLECYSTECTOMY N/A 06/26/2018   Procedure: LAPAROSCOPIC CHOLECYSTECTOMY POSSIBLE INTRAOPERATIVE CHOLANGIOGRAM;  Surgeon: Abigail Miyamoto, MD;  Location: [facility] OR;  Service: General;  Laterality: N/A;  . JOINT REPLACEMENT    . REPLACEMENT TOTAL KNEE Right 09/2005  . RIGHT/LEFT HEART CATH AND CORONARY ANGIOGRAPHY N/A 06/01/2018   Procedure: RIGHT/LEFT HEART CATH AND CORONARY ANGIOGRAPHY;  Surgeon: Corky Crafts, MD;  Location: [facility] INVASIVE CV LAB;  Service: Cardiovascular;  Laterality: N/A;  . SHOULDER OPEN ROTATOR CUFF REPAIR Right 07/1999  . TONSILLECTOMY  08/1974  . TOTAL KNEE ARTHROPLASTY Left 08/07/2013   Procedure: TOTAL KNEE ARTHROPLASTY- LEFT;  Surgeon: Dannielle Huh, MD;  Location: [facility] OR;  Service: Orthopedics;  Laterality: Left;  . TRANSTHORACIC ECHOCARDIOGRAM  2003   EF 55-65%; mild concentric LVH     reports that she has never smoked. She has never used smokeless tobacco. She reports that she does not drink alcohol or use drugs. Social History   Socioeconomic History  . Marital status: Widowed    Spouse name: Not on file  . Number of children: Not on file  . Years of education: Not on file  . Highest education level: Not on file  Occupational History  . Not on file  Social Needs  . Financial resource strain: Not on file  . Food insecurity:    Worry: Not on file    Inability: Not on file  . Transportation needs:    Medical: Not on file    Non-medical: Not on file  Tobacco Use  . Smoking status: Never Smoker  . Smokeless tobacco: Never Used  Substance and Sexual Activity  . Alcohol use: No    Alcohol/week: 0.0 standard drinks  . Drug use: No  . Sexual activity: Never  Lifestyle  . Physical activity:    Days per week: Not  on file    Minutes per session: Not on file  . Stress: Not on file  Relationships  . Social connections:    Talks on phone: Not on file    Gets together: Not on file    Attends religious service: Not on file    Active member of club or organization: Not on file    Attends meetings of clubs or organizations: Not on file    Relationship status: Not on file  . Intimate partner violence:    Fear of current or ex partner: Not on file    Emotionally abused: Not on file    Physically abused: Not on file    Forced sexual activity: Not on file  Other Topics Concern  . Not on file  Social History Narrative   Widowed   Walks with cane    Pertinent  Health Maintenance Due  Topic Date Due  . FOOT EXAM  08/08/2018 (Originally 01/14/2018)  . OPHTHALMOLOGY EXAM  08/08/2018 (  Originally 09/12/2015)  . PNA vac Low Risk Adult (2 of 2 - PCV13) 08/08/2018 (Originally 09/11/2016)  . INFLUENZA VACCINE  08/28/2018 (Originally 06/09/2018)  . HEMOGLOBIN A1C  12/26/2018  . DEXA SCAN  Completed    Medications: Allergies as of 07/12/2018      Reactions   Clonidine Derivatives Swelling   [name]   Fish Allergy Diarrhea, Swelling, Other (See Comments)   Turns skin "black," but can tolerate white fish Salmon- Diarrhea   Shellfish Allergy Hives   Doxycycline Rash   Indomethacin Other (See Comments)   Reaction not recalled by the patient   Lyrica [pregabalin] Other (See Comments)   Hallucinations   Methyldopa Other (See Comments)   Aldomet (for hypertension): Reaction not recalled by the patient   Erskine Emery Fruit [citrus] Other (See Comments)   Indigestion/heartburn   Cetirizine Hcl Itching, Rash   Codeine Itching   Levaquin [levofloxacin In D5w] Rash   Tomato Rash      Medication List        Accurate as of 07/12/18 11:59 PM. Always use your most recent med list.          albuterol 108 (90 Base) MCG/ACT inhaler Commonly known  as:  PROVENTIL HFA;VENTOLIN HFA Inhale 1 puff into the lungs every 6 (six) hours as needed for wheezing or shortness of breath.   allopurinol 100 MG tablet Commonly known as:  ZYLOPRIM Take 100 mg by mouth daily.   aspirin EC 81 MG tablet Take 81 mg by mouth daily.   carvedilol 25 MG tablet Commonly known as:  COREG Take 1 tablet (25 mg total) by mouth 2 (two) times daily with a meal.   D3-1000 1000 units capsule Generic drug:  Cholecalciferol Take 1,000 Units by mouth daily.   diclofenac sodium 1 % Gel Commonly known as:  VOLTAREN Apply 1 application topically daily.   ferrous sulfate 325 (65 FE) MG tablet Take 1 tablet (325 mg total) by mouth 2 (two) times daily with a meal.   furosemide 40 MG tablet Commonly known as:  LASIX Take 1 tablet (40 mg total) by mouth daily.   gabapentin 100 MG capsule Commonly known as:  NEURONTIN Take 1 capsule (100 mg total) by mouth at bedtime.   hydrALAZINE 100 MG tablet Commonly known as:  APRESOLINE TAKE 1 TABLET BY MOUTH THREE TIMES DAILY   hydroxypropyl methylcellulose / hypromellose 2.5 % ophthalmic solution Commonly known as:  ISOPTO TEARS / GONIOVISC Place 1 drop into both eyes 4 (four) times daily as needed for dry eyes.   isosorbide mononitrate 30 MG 24 hr tablet Commonly known as:  IMDUR Take 1 tablet (30 mg total) by mouth daily.   losartan 25 MG tablet Commonly known as:  COZAAR Take 3 tablets (75 mg total) by mouth daily.   metFORMIN 500 MG tablet Commonly known as:  GLUCOPHAGE Take 1 tablet (500 mg total) by mouth daily with breakfast.   Olopatadine HCl 0.2 % Soln Apply 1 drop to eye daily.   potassium chloride SA 20 MEQ tablet Commonly known as:  K-DUR,KLOR-CON Take 2 tablets (40 mEq total) by mouth daily.   predniSONE 10 MG tablet Commonly known as:  DELTASONE Take 1 tablet (10 mg total) by mouth daily with breakfast.   triamcinolone cream 0.1 % Commonly known as:  KENALOG Apply 1 application  topically 2 (two) times daily.        Vitals:   07/12/18 1157  BP: 116/70  Pulse: 98  Resp: 17  Temp: (!) 97 F (36.1 C)  Weight: 172 lb (78 kg)  Height: 5\' 4"  (1.626 m)   Body mass index is 29.52 kg/m.  Physical Exam  GENERAL APPEARANCE: Alert, conversant. No acute distress.  HEENT: Unremarkable. RESPIRATORY: Breathing is even, unlabored. Lung sounds are clear   CARDIOVASCULAR: Heart RRR no murmurs, rubs or gallops. No peripheral edema.  GASTROINTESTINAL: Abdomen is soft, non-tender, not distended w/ normal bowel sounds.  NEUROLOGIC: Cranial nerves 2-12 grossly intact. Moves all extremities   Labs reviewed: Basic Metabolic Panel: Recent Labs    11/28/17 0809  05/29/18 0030  05/30/18 0449  06/01/18 0459  06/25/18 0607 06/26/18 0445 06/27/18 0630 07/04/18  NA 134*   < >  --    < > 143   < > 141  140   < > 138 140 140 142  K 4.5   < >  --    < > 4.1   < > 3.5  3.5   < > 3.1* 3.6 3.5 3.9  CL 107   < >  --    < > 108   < > 108  107   < > 101 105 104  --   CO2 21*   < >  --    < > 25   < > 26  27   < > 25 27 26   --   GLUCOSE 132*   < >  --    < > 94   < > 109*  110*   < > 144* 143* 108*  --   BUN 33*   < >  --    < > 24*   < > 22  21   < > 14 15 17  40*  CREATININE 1.05*   < >  --    < > 1.31*   < > 1.00  0.98   < > 1.15* 1.11* 1.10* 1.2*  CALCIUM 8.7*   < >  --    < > 8.7*   < > 8.9  8.9   < > 9.0 9.0 8.7*  --   MG 2.7*  --  1.7  --  1.7  --   --   --   --   --   --   --   PHOS  --   --   --   --   --   --  2.7  --   --   --  2.7  --    < > = values in this interval not displayed.   No results found for: [facility] Liver Function Tests: Recent Labs    06/24/18 0524 06/25/18 0607 06/26/18 0445 06/27/18 0630  AST 43* 27 24  --   ALT 140* 104* 80*  --   ALKPHOS 225* 184* 161*  --   BILITOT 0.7 0.9 0.9  --   PROT 6.2* 6.6 6.5  --   ALBUMIN 3.0* 3.0* 3.0* 2.9*   Recent Labs    06/22/18 2153 06/23/18 0500  LIPASE 150* 77*   No results for  input(s): AMMONIA in the last 8760 hours. CBC: Recent Labs    06/25/18 0607 06/26/18 0445 06/27/18 0630 07/04/18 07/11/18 07/12/18  WBC 7.4 8.0 11.9* 7.8 6.9 6.8  NEUTROABS 5.4 6.2 8.9*  --   --   --   HGB 11.0* 11.1* 10.5* 11.1* 10.5* 11.2*  HCT 33.6* 34.2* 32.7* 33* 32* 34*  MCV 89.6 89.5 92.1  --   --   --   PLT 290 263 237 266 266 289   Lipid Recent Labs    05/29/18 0030 06/24/18 0524 06/25/18 0607  CHOL 178 189 202*  HDL 73 63 63  LDLCALC 92 113* 126*  TRIG 64 65 67   Cardiac Enzymes: Recent Labs    06/23/18 0500 06/23/18 1015 06/23/18 1628  TROPONINI 0.03* 0.03* 0.03*   BNP: Recent Labs    05/28/18 1614 06/22/18 2153  BNP 2,077.6* 1,146.7*   CBG: Recent Labs    06/27/18 0727 06/27/18 1154 06/27/18 1733  GLUCAP 107* 131* 132*    Procedures and Imaging Studies During Stay: Dg Chest 2 View  Result Date: 06/22/2018 CLINICAL DATA:  Chest pain and abdominal pain EXAM: CHEST - 2 VIEW COMPARISON:  06/01/2018 FINDINGS: Cardiac shadow is again enlarged but stable. Mild vascular congestion is noted without significant edema. No focal infiltrate is seen. No acute bony abnormality is noted. Chronic changes about both shoulder joints are seen. IMPRESSION: Cardiomegaly and mild congestive changes. Electronically Signed   By: Alcide Clever [name]   On: 06/22/2018 22:26   Mr Maxine Glenn Head Wo Contrast  Result Date: 06/24/2018 CLINICAL DATA:  Stroke follow-up EXAM: MRA HEAD WITHOUT CONTRAST TECHNIQUE: Angiographic images of the Circle of Willis were obtained using MRA technique without intravenous contrast. COMPARISON:  Brain MRI 06/24/2018 FINDINGS: ANTERIOR CIRCULATION: --Intracranial internal carotid arteries: Normal. --Anterior cerebral arteries: Normal. Both A1 segments are present. Patent anterior communicating artery. --Middle cerebral arteries: Normal. --Posterior communicating arteries: Present on the right POSTERIOR CIRCULATION: --Basilar artery: Normal. --Posterior  cerebral arteries: Normal. --Superior cerebellar arteries: Normal. --Inferior cerebellar arteries: Normal anterior and posterior inferior cerebellar arteries. IMPRESSION: Normal intracranial MRA. Electronically Signed   By: Deatra Robinson [name]   On: 06/24/2018 15:39   Mr Brain Wo Contrast  Result Date: 06/24/2018 CLINICAL DATA:  84 year old female who was found with left side weakness and difficult to arouse this morning. Code stroke. EXAM: MRI HEAD WITHOUT CONTRAST TECHNIQUE: Multiplanar, multiecho pulse sequences of the brain and surrounding structures were obtained without intravenous contrast. COMPARISON:  Head CT without contrast 0628 hours today. Brain MRI 01/17/2014. FINDINGS: Brain: There are 1 or more small foci of left occipital horn periventricular white matter abnormal trace diffusion (series 3, image 18) which appear isointense to mildly facilitated on ADC. Associated T2 and FLAIR hyperintensity with no hemorrhage or mass effect. These are new since 2015. Superimposed chronic but progressed since 2015 patchy and confluent additional bilateral periventricular white matter T2 and FLAIR hyperintensity which is facilitated on diffusion. No convincing restricted diffusion or acute infarct. No midline shift, mass effect, evidence of mass lesion, ventriculomegaly, extra-axial collection or acute intracranial hemorrhage. Cervicomedullary junction and pituitary are within normal limits. Possible small left paracentral pons chronic lacune is stable (series 7, image 10). Tiny chronic lacune in the inferior right cerebellum on image 7 also appears stable. The deep gray matter nuclei remain normal for age. No cortical encephalomalacia or chronic cerebral blood products. Vascular: Major intracranial vascular flow voids are stable since 2015. Skull and upper cervical spine: Negative visible cervical spine. Visualized bone marrow signal is within normal limits. Sinuses/Orbits: Stable and negative. Other:  Nasopharyngeal Tornwaldt cyst has regressed since 2015. Mastoid air cells remain well pneumatized. Grossly normal visible internal auditory structures. Scalp and face soft tissues appear negative. IMPRESSION: 1. No acute infarct identified. Small subacute appearing left occipital lobe periventricular white matter infarcts with no associated  hemorrhage or mass effect. 2. Mild to moderate for age abnormal periventricular white matter signal elsewhere has progressed since 2015. Small chronic lacunes suspected in the left pons and cerebellum. Electronically Signed   By: Odessa Fleming [name]   On: 06/24/2018 11:17   US Abdomen Complete  Result Date: 06/23/2018 CLINICAL DATA:  Abdominal pain for 3 days. History of appendectomy, hysterectomy. EXAM: ABDOMEN ULTRASOUND COMPLETE COMPARISON:  Abdominal ultrasound November 30, 2016 and CT abdomen and pelvis September 11, 2015. FINDINGS: Gallbladder: Mild distended gallbladder with punctate echogenic layering gallbladder sludge and cholelithiasis measuring to 4 mm. No gallbladder wall thickening or pericholecystic fluid. No sonographic Murphy sign elicited. Common bile duct: Diameter: Dilated common bile duct measuring to 15 mm with suspected sludge. Liver: No focal lesion identified. Mild intrahepatic biliary dilatation. Within normal limits in parenchymal echogenicity. Portal vein is patent on color Doppler imaging with normal direction of blood flow towards the liver. IVC: No abnormality visualized. Pancreas: Visualized portion unremarkable. Spleen: Size and appearance within normal limits. Right Kidney: Length: 10.5 cm. Mildly increased cortical echogenicity. No solid mass or hydronephrosis visualized. 5.2 cm homogeneously anechoic cyst upper pole RIGHT kidney. Left Kidney: Length: 11.4 cm. Mildly increased cortical echogenicity. No solid mass or hydronephrosis visualized. Abdominal aorta: No aneurysm visualized. Echogenic calcific atherosclerosis. Other findings: None. IMPRESSION:  1. Cholelithiasis and sludge without sonographic findings of acute cholecystitis. 2. Biliary dilatation with potential choledocholithiasis/sludge. MRCP may be more definitive. 3. Mildly echogenic kidneys seen with medical renal disease. Aortic Atherosclerosis (ICD10-I70.0). Electronically Signed   By: Awilda Metro [name]   On: 06/23/2018 00:37   Mr Abdomen Mrcp Wo Contrast  Result Date: 06/23/2018 CLINICAL DATA:  Epigastric pain. Elevated LFTs a gallstone. Concern for common bile duct stone. EXAM: MRI ABDOMEN WITHOUT CONTRAST  (INCLUDING MRCP) TECHNIQUE: Multiplanar multisequence MR imaging of the abdomen was performed. Heavily T2-weighted images of the biliary and pancreatic ducts were obtained, and three-dimensional MRCP images were rendered by post processing. COMPARISON:  Ultrasound 06/22/2018 FINDINGS: Lower chest:  Lung bases are clear. Hepatobiliary: No intrahepatic biliary duct dilatation. Gallbladder nondistended. Sludge in gallstones described on comparison ultrasound are difficult to define. No intrahepatic duct dilatation. The common hepatic duct measures 14 mm. Duct tapers to 9 mm through the common bile duct. Duct measures 9 mm at the ampullary region. No evidence clear stone within the common bile duct. There is some dependent material within the common bile duct through the pancreatic head region (image 24/8). This may represent sludge. No obstructing lesion at the ampulla. Potential sludge within the distal common bile duct (image 25/8) layers dependently. Pancreas: Pancreatic duct is nondilated. No pancreatic inflammation. Spleen: Normal spleen. Adrenals/urinary tract: Adrenal glands normal. Benign-appearing cyst of the RIGHT kidney Stomach/Bowel: Stomach and limited of the small bowel is unremarkable Vascular/Lymphatic: Abdominal aortic normal caliber. No retroperitoneal periportal lymphadenopathy. Musculoskeletal: No aggressive osseous lesion IMPRESSION: 1. Mild dilatation of the common  hepatic duct and common bile duct. No obstructing lesion is identified. Small amount sludge layers dependently within the common bile duct. 2. No gallstones evident. Gallstones and sludge identified on comparison ultrasound. 3. No intrahepatic duct dilatation. 4. No pancreatic inflammation Electronically Signed   By: Genevive Bi [name]   On: 06/23/2018 12:22   Mr 3d Recon At Scanner  Result Date: 06/23/2018 CLINICAL DATA:  Epigastric pain. Elevated LFTs a gallstone. Concern for common bile duct stone. EXAM: MRI ABDOMEN WITHOUT CONTRAST  (INCLUDING MRCP) TECHNIQUE: Multiplanar multisequence MR imaging of the abdomen was performed. Heavily  T2-weighted images of the biliary and pancreatic ducts were obtained, and three-dimensional MRCP images were rendered by post processing. COMPARISON:  Ultrasound 06/22/2018 FINDINGS: Lower chest:  Lung bases are clear. Hepatobiliary: No intrahepatic biliary duct dilatation. Gallbladder nondistended. Sludge in gallstones described on comparison ultrasound are difficult to define. No intrahepatic duct dilatation. The common hepatic duct measures 14 mm. Duct tapers to 9 mm through the common bile duct. Duct measures 9 mm at the ampullary region. No evidence clear stone within the common bile duct. There is some dependent material within the common bile duct through the pancreatic head region (image 24/8). This may represent sludge. No obstructing lesion at the ampulla. Potential sludge within the distal common bile duct (image 25/8) layers dependently. Pancreas: Pancreatic duct is nondilated. No pancreatic inflammation. Spleen: Normal spleen. Adrenals/urinary tract: Adrenal glands normal. Benign-appearing cyst of the RIGHT kidney Stomach/Bowel: Stomach and limited of the small bowel is unremarkable Vascular/Lymphatic: Abdominal aortic normal caliber. No retroperitoneal periportal lymphadenopathy. Musculoskeletal: No aggressive osseous lesion IMPRESSION: 1. Mild dilatation of the  common hepatic duct and common bile duct. No obstructing lesion is identified. Small amount sludge layers dependently within the common bile duct. 2. No gallstones evident. Gallstones and sludge identified on comparison ultrasound. 3. No intrahepatic duct dilatation. 4. No pancreatic inflammation Electronically Signed   By: Genevive Bi [name]   On: 06/23/2018 12:22   Ct Head Code Stroke Wo Contrast  Result Date: 06/24/2018 CLINICAL DATA:  Code stroke. Initial evaluation for acute left arm weakness, slurred speech. EXAM: CT HEAD WITHOUT CONTRAST TECHNIQUE: Contiguous axial images were obtained from the base of the skull through the vertex without intravenous contrast. COMPARISON:  Prior CT from 05/29/2017 FINDINGS: Brain: Atrophy with moderate chronic microvascular ischemic disease. No acute intracranial hemorrhage. No acute large vessel territory infarct. No mass lesion, midline shift or mass effect. No hydrocephalus. No extra-axial fluid collection. Vascular: No hyperdense vessel. Scattered vascular calcifications noted within the carotid siphons. Skull: Scalp soft tissues and calvarium within normal limits. Sinuses/Orbits: Globes and orbital soft tissues demonstrate no acute finding. Mild layering opacity within left sphenoid sinus. Small right mastoid effusion. Other: None. ASPECTS [facility] Stroke Program Early CT Score) - Ganglionic level infarction (caudate, lentiform nuclei, internal capsule, insula, M1-M3 cortex): 7 - Supraganglionic infarction (M4-M6 cortex): 3 Total score (0-10 with 10 being normal): 10 IMPRESSION: 1. No acute intracranial infarct or other abnormality. 2. ASPECTS is 10. 3. Atrophy with moderate chronic small vessel ischemic disease. 4. Mild acute left sphenoid sinusitis. These results were communicated to Dr. Laurence Slate At 6:34 amon 8/16/2019by text page via the [facility] messaging system. Electronically Signed   By: Rise Mu [name]   On: 06/24/2018 06:35    Assessment/Plan:     Cholecystitis  Primary osteoarthritis involving multiple joints  Rheumatoid arthritis involving multiple sites with positive rheumatoid factor (HCC)  Acute combined systolic and diastolic heart failure (HCC)  Type 2 diabetes mellitus with diabetic nephropathy, without long-term current use of insulin (HCC)  Acute on chronic systolic congestive heart failure (HCC)  Acute metabolic encephalopathy  Essential hypertension  Idiopathic chronic gout of multiple sites without tophus   Patient is being discharged with the following home health services:  OT/PT/Nursing  Patient is being discharged with the following durable medical equipment:  none  Patient has been advised to f/u with their PCP in 1-2 weeks to bring them up to date on their rehab stay.  Social services at facility was responsible for arranging this appointment.  Pt  was provided with a 30 day supply of prescriptions for medications and refills must be obtained from their PCP.  For controlled substances, a more limited supply may be provided adequate until PCP appointment only.  Medications have been reconciled.  Time spent greater than 30 minutes;> 50% of time with patient was spent reviewing records, labs, tests and studies, counseling and developing plan of care  [name] [name]. Lyn Hollingshead, MD

## 2018-07-14 ENCOUNTER — Other Ambulatory Visit: Payer: Self-pay | Admitting: *Deleted

## 2018-07-14 ENCOUNTER — Telehealth: Payer: Self-pay | Admitting: Family Medicine

## 2018-07-14 ENCOUNTER — Other Ambulatory Visit: Payer: Self-pay

## 2018-07-14 NOTE — Telephone Encounter (Signed)
FYI

## 2018-07-14 NOTE — Patient Outreach (Signed)
[facility]) Care Management  07/14/2018  [name] [name] September 09, 1930 [id number]  84 year old female eferred to [facility] Care Management.  [facility] services requested for medication management. Patient is a recent discharge from a SNF.  PMHx includes, but not limited to, coronary artery disease, hypertension, congestive heart failure, dilated cardiomyopathy, cerebral infarct, diverticulosis, CKD Stage 3, diabetes mellitus, rheumatoid arthritis, gout, fibromyalgia, and hyperlipidemia.   Successful outreach call to [name]' daughter, [name].  HIPAA identifiers verified.   Patient recently discharged from a SNF.   Plan: Home visit scheduled for tomorrow at 12:30.  Berlin Hun, PharmD Clinical Pharmacist [facility] [phone]

## 2018-07-14 NOTE — Patient Outreach (Signed)
[facility]) Care Management  07/14/2018  LASHONDRA VAQUERANO 10/11/1930 [id number]   Referral received on 9/3 with d/c from the SNF 9/4  Initial outreach unsuccessful however able to leave a HIPAA approved voice message requesting a call back. Will further engage at that time on possible needs. Will reschedule another call back next week for pending services.  Elliot Cousin, RN Care Management Coordinator [facility] Main Office [phone]

## 2018-07-14 NOTE — Telephone Encounter (Signed)
See note  Copied from CRM [id number]. Topic: General - Other >> Jul 14, 2018  1:46 PM Marylen Ponto wrote: Reason for CRM: Matthias Hughs with [facility] states they will be going out on 07/16/18 to start seeing the pt. Cb# [phone]

## 2018-07-15 ENCOUNTER — Ambulatory Visit: Payer: Self-pay

## 2018-07-15 ENCOUNTER — Other Ambulatory Visit: Payer: Self-pay

## 2018-07-15 NOTE — Patient Outreach (Signed)
[facility]) Care Management  [facility]   07/15/2018  [name] [name] 02/21/1930 [id number]   84 year old female eferred to [facility] Care Management.  [facility] services requested for medication management. Patient is a recent discharge from a SNF.  PMHx includes, but not limited to, coronary artery disease, hypertension, congestive heart failure, dilated cardiomyopathy, cerebral infarct, diverticulosis, CKD Stage 3, diabetes mellitus, rheumatoid arthritis, gout, fibromyalgia, and hyperlipidemia.   Successful home visit with [name] and her daughter, [name].  HIPAA identifiers verified.   Subjective: [name] reports that she is feeling well after her recent SNF discharge.  She states that she is recording her daily weights, blood pressure and glucose.  Patient reports that she has a home health nurse coming out and has the help of her daughter.  Patient reports that her rheumatoid arthritis has been causing her pain in her upper extremities.  She states most of her RA medications had been discontinued while hospitalized, but are now resumed with the exception of methotrexate.  Objective:  HgA1c 5.9% on 06/25/18 SCr 1.10 mg/dL on 03/18/31  Current Medications: Current Outpatient Medications  Medication Sig Dispense Refill  . albuterol (PROVENTIL HFA;VENTOLIN HFA) 108 (90 Base) MCG/ACT inhaler Inhale 1 puff into the lungs every 6 (six) hours as needed for wheezing or shortness of breath.    . allopurinol (ZYLOPRIM) 100 MG tablet Take 100 mg by mouth daily.     Marland Kitchen aspirin EC 81 MG tablet Take 81 mg by mouth daily.    . calcium-vitamin D (OSCAL WITH D) 500-200 MG-UNIT tablet Take 1 tablet by mouth daily with breakfast.    . carvedilol (COREG) 25 MG tablet Take 1 tablet (25 mg total) by mouth 2 (two) times daily with a meal. 60 tablet 0  . D3-1000 1000 units capsule Take 1,000 Units by mouth daily.  0  . diclofenac sodium (VOLTAREN) 1 % GEL Apply 1  application topically daily. 100 g 4  . ferrous sulfate 325 (65 FE) MG tablet Take 1 tablet (325 mg total) by mouth 2 (two) times daily with a meal. 60 tablet 0  . gabapentin (NEURONTIN) 100 MG capsule Take 1 capsule (100 mg total) by mouth at bedtime. 30 capsule 3  . hydrALAZINE (APRESOLINE) 100 MG tablet TAKE 1 TABLET BY MOUTH THREE TIMES DAILY 90 tablet 2  . hydroxychloroquine (PLAQUENIL) 200 MG tablet Take 200 mg by mouth 2 (two) times daily.    . hydroxypropyl methylcellulose / hypromellose (ISOPTO TEARS / GONIOVISC) 2.5 % ophthalmic solution Place 1 drop into both eyes 4 (four) times daily as needed for dry eyes. (Patient taking differently: Place 1 drop into both eyes 4 (four) times daily as needed for dry eyes. Pt uses about once daily.) 15 mL 2  . isosorbide mononitrate (IMDUR) 30 MG 24 hr tablet Take 1 tablet (30 mg total) by mouth daily. 30 tablet 0  . leflunomide (ARAVA) 20 MG tablet Take 20 mg by mouth daily.    Marland Kitchen losartan (COZAAR) 50 MG tablet Take 75 mg by mouth daily.    . metFORMIN (GLUCOPHAGE) 500 MG tablet Take 1 tablet (500 mg total) by mouth daily with breakfast. 90 tablet 2  . Olopatadine HCl 0.2 % SOLN Apply 1 drop to eye daily. 2.5 mL 3  . potassium chloride SA (K-DUR,KLOR-CON) 20 MEQ tablet Take 2 tablets (40 mEq total) by mouth daily.    . predniSONE (DELTASONE) 10 MG tablet Take 1 tablet (10 mg  total) by mouth daily with breakfast. 5 tablet 0  . spironolactone (ALDACTONE) 25 MG tablet Take 12.5 mg by mouth daily.    Marland Kitchen triamcinolone cream (KENALOG) 0.1 % Apply 1 application topically 2 (two) times daily.    . vitamin C (ASCORBIC ACID) 500 MG tablet Take 500 mg by mouth daily.    . furosemide (LASIX) 40 MG tablet Take 1 tablet (40 mg total) by mouth daily. 30 tablet 0   No current facility-administered medications for this visit.     Functional Status: In your present state of health, do you have any difficulty performing the following activities: 06/23/2018 05/29/2018   Hearing? N -  Vision? N -  Difficulty concentrating or making decisions? Y -  Walking or climbing stairs? Y -  Dressing or bathing? Y -  Doing errands, shopping? Y Y  Some recent data might be hidden    Fall/Depression Screening: Fall Risk  06/28/2017 03/29/2017 12/08/2016  Falls in the past year? No Yes No  Number falls in past yr: - 2 or more -  Injury with Fall? - Yes -  Risk Factor Category  - High Fall Risk -  Risk for fall due to : - History of fall(s);Impaired balance/gait;Impaired mobility;Mental status change -  Follow up - Education provided;Falls prevention discussed -   PHQ 2/9 Scores 06/28/2017 03/29/2017 03/04/2016 07/03/2015 11/27/2014 09/26/2014 07/11/2013  PHQ - 2 Score 0 0 0 0 0 0 0  PHQ- 9 Score - - - - - - -   ASSESSMENT: Date Discharged from Hospital: 06/22/18 and went to SNF and discharged from there on 07/13/18.  Date Medication Reconciliation Performed: 07/15/2018   Medications Discontinued at Discharge from hospital:  Calcium-vitamin D  Hydroxychloroquine  Leflunomide  Methotrexate  Spironolactone  New Medications at Discharge from SNF:   Spironolactone  Losartan   Leflunomide  Hydroxychloroquine  Hydrocodone/APAP  Patient was recently discharged from hospital and all medications have been reviewed  Drugs sorted by system:   Cardiovascular: aspirin, carvedilol, furosemide, hydralazine, losartan, potassium chloride, spironolactone  Pulmonary/Allergy: albuterol  Endocrine: metformin  Topical: diclofenac sodium, isopto tears, olopatadine opth., triamcinolone cream  Pain: gabapentin, hydrocodone/APAP  Vitamins/Minerals: calcium/vitamin D, vitamin D, ferrous sulfate, vitamin C  Miscellaneous: allopurinol, hydroxychloroquine, leflunomide, prednisone  Gaps in therapy:  Patient with diabetes and not on statin therapy.  Drug interactions:  The risk of hyperkalemia may be increased when potassium-sparing diuretics are co-administered with ARBs.  Patient is also on potassium chloride 40 meq daily.   Other issues noted:  Of note, patient had 4 prescription sheets with multiple medications prescribed on each sheet.  Based on discharge instructions from SNF, patient had the following changes from her prior regimen:   Allopurinol 100 mg tablet- dose is now 1 tablet daily instead of 2 tablets daily.   Potassium chloride 20 meq tablets- dose is now 40 meq daily instead of 20 meq daily.   Had loose prednisone 5 mg tablets in container.  They were discarded and new prescription for prednisone10 mg is getting filled.    Patient with new prescription for Hydrocodone/APAP.  She had tramadol at home that she reports taking infrequently, but she would not let me discard it, as it was no longer ordered.   Patient had the following medications that were not prescribed post discharge that were discarded: methotrexate, ranitidine, mirtazapine, labetalol.    Patient is under the assumption that she is taking lasix 20 mg daily, with an extra 20 mg/day if she has  3 lb weight gain.  Her discharge prescription was for 40 mg daily.  Daughter requests that this dose be clarified with cardiology.  Ms. Labuda has an appointment with Dr. Hassell Halim, cardiologist, on 07/25/18.  I placed a call to Ms. Mayford Knife' pharmacy, [facility] and spoke with Aurther Loft, [place].  I asked him to please deactivate the medications off her [facility]' profile that are not listed on the prescriptions that her daughter is bringing to the pharmacy today.  This will help avoid future confusion as she calls in for refills.  Daughter had a least twenty medications on the prescriptions that she was taking to be profiled.  I also gave the pharmacist the list of medications that she needs filled now based on what she currently had at home.   Plan: Route note to Dr. Duke Salvia to clarify lasix regimen and follow up with Mayford Knife family.  Berlin Hun, PharmD Clinical Pharmacist [facility] [phone]  Berlin Hun, PharmD Clinical Pharmacist [facility] [phone]

## 2018-07-16 ENCOUNTER — Encounter: Payer: Self-pay | Admitting: Internal Medicine

## 2018-07-18 ENCOUNTER — Other Ambulatory Visit: Payer: Self-pay

## 2018-07-18 ENCOUNTER — Ambulatory Visit: Payer: Self-pay

## 2018-07-18 ENCOUNTER — Telehealth: Payer: Self-pay | Admitting: Family Medicine

## 2018-07-18 DIAGNOSIS — I252 Old myocardial infarction: Secondary | ICD-10-CM | POA: Diagnosis not present

## 2018-07-18 DIAGNOSIS — E1151 Type 2 diabetes mellitus with diabetic peripheral angiopathy without gangrene: Secondary | ICD-10-CM | POA: Diagnosis not present

## 2018-07-18 DIAGNOSIS — N183 Chronic kidney disease, stage 3 (moderate): Secondary | ICD-10-CM | POA: Diagnosis not present

## 2018-07-18 DIAGNOSIS — I2510 Atherosclerotic heart disease of native coronary artery without angina pectoris: Secondary | ICD-10-CM | POA: Diagnosis not present

## 2018-07-18 DIAGNOSIS — I270 Primary pulmonary hypertension: Secondary | ICD-10-CM | POA: Diagnosis not present

## 2018-07-18 DIAGNOSIS — M1A09X0 Idiopathic chronic gout, multiple sites, without tophus (tophi): Secondary | ICD-10-CM | POA: Diagnosis not present

## 2018-07-18 DIAGNOSIS — M069 Rheumatoid arthritis, unspecified: Secondary | ICD-10-CM | POA: Diagnosis not present

## 2018-07-18 DIAGNOSIS — D631 Anemia in chronic kidney disease: Secondary | ICD-10-CM | POA: Diagnosis not present

## 2018-07-18 DIAGNOSIS — M1991 Primary osteoarthritis, unspecified site: Secondary | ICD-10-CM | POA: Diagnosis not present

## 2018-07-18 DIAGNOSIS — I5043 Acute on chronic combined systolic (congestive) and diastolic (congestive) heart failure: Secondary | ICD-10-CM | POA: Diagnosis not present

## 2018-07-18 DIAGNOSIS — I130 Hypertensive heart and chronic kidney disease with heart failure and stage 1 through stage 4 chronic kidney disease, or unspecified chronic kidney disease: Secondary | ICD-10-CM | POA: Diagnosis not present

## 2018-07-18 DIAGNOSIS — E1122 Type 2 diabetes mellitus with diabetic chronic kidney disease: Secondary | ICD-10-CM | POA: Diagnosis not present

## 2018-07-18 NOTE — Telephone Encounter (Signed)
FYI

## 2018-07-18 NOTE — Patient Outreach (Signed)
[facility]) Care Management  07/18/2018  PETE MERTEN 05-13-1930 [id number]   84year old femaleeferred to [facility] Pharm[facility] services requested formedication management. Patient is a recent discharge from a SNF. PMHx includes, but not limited to, coronary artery disease, hypertension, congestive heart failure, dilated cardiomyopathy, cerebral infarct, diverticulosis, CKD Stage 3, diabetes mellitus, rheumatoid arthritis, gout, fibromyalgia, and hyperlipidemia.   Successful outreach attempt to Ms. Tenpas' daughter, Ms. Verlon Au.  HIPAA identifiers verified.    Medication Management: In-basket message received from Dr. Leonides Sake office stating that Ms. Pensyl should be taking lasix 40 mg daily.  Informed patient's daughter, Ms. Rolley Sims who verbalized understanding.   Ms. Rolley Sims states that [facility] called and told them they needed to come pick up a prescription for Ms. Cammarata over the weekend.  Daughter was confused because she had picked up her mother's prescriptions on Friday and they had not called for any refills.  I clarified with Aurther Loft, RPh at [facility] that the patient needed to pick up her losartan. It had been filled Friday, but had inadvertently not been picked up with her other medications.   I informed the daughter of the situation and she stated that she will pick it up today.   Plan: Outreach to Ms. Carmickle in a week to see is she has any medication questions or concerns after the cardiology visit on 07/25/18.  Berlin Hun, PharmD Clinical Pharmacist [facility] [phone]

## 2018-07-18 NOTE — Telephone Encounter (Signed)
See note.  Copied from CRM [id number]. Topic: General - Other >> Jul 18, 2018  1:22 PM Gaynelle Adu wrote: Reason for CRM: Matthias Hughs with [facility] states they will be going out on 07/18/18 to start seeing the pt. Cb# [phone]

## 2018-07-19 ENCOUNTER — Other Ambulatory Visit: Payer: Self-pay | Admitting: *Deleted

## 2018-07-19 DIAGNOSIS — I5043 Acute on chronic combined systolic (congestive) and diastolic (congestive) heart failure: Secondary | ICD-10-CM | POA: Diagnosis not present

## 2018-07-19 DIAGNOSIS — I130 Hypertensive heart and chronic kidney disease with heart failure and stage 1 through stage 4 chronic kidney disease, or unspecified chronic kidney disease: Secondary | ICD-10-CM | POA: Diagnosis not present

## 2018-07-19 DIAGNOSIS — M1A09X0 Idiopathic chronic gout, multiple sites, without tophus (tophi): Secondary | ICD-10-CM | POA: Diagnosis not present

## 2018-07-19 DIAGNOSIS — M1991 Primary osteoarthritis, unspecified site: Secondary | ICD-10-CM | POA: Diagnosis not present

## 2018-07-19 DIAGNOSIS — E1151 Type 2 diabetes mellitus with diabetic peripheral angiopathy without gangrene: Secondary | ICD-10-CM | POA: Diagnosis not present

## 2018-07-19 DIAGNOSIS — M069 Rheumatoid arthritis, unspecified: Secondary | ICD-10-CM | POA: Diagnosis not present

## 2018-07-19 DIAGNOSIS — I270 Primary pulmonary hypertension: Secondary | ICD-10-CM | POA: Diagnosis not present

## 2018-07-19 DIAGNOSIS — I2510 Atherosclerotic heart disease of native coronary artery without angina pectoris: Secondary | ICD-10-CM | POA: Diagnosis not present

## 2018-07-19 DIAGNOSIS — E1122 Type 2 diabetes mellitus with diabetic chronic kidney disease: Secondary | ICD-10-CM | POA: Diagnosis not present

## 2018-07-19 DIAGNOSIS — N183 Chronic kidney disease, stage 3 (moderate): Secondary | ICD-10-CM | POA: Diagnosis not present

## 2018-07-19 DIAGNOSIS — I252 Old myocardial infarction: Secondary | ICD-10-CM | POA: Diagnosis not present

## 2018-07-19 DIAGNOSIS — D631 Anemia in chronic kidney disease: Secondary | ICD-10-CM | POA: Diagnosis not present

## 2018-07-19 NOTE — Patient Outreach (Signed)
[facility]) Care Management  07/19/2018  [name] [name] 1930/04/27 [id number]    RN returned call back to however unsuccessful contact but able to leave a HIPAA approved voice message requesting a call back. Will continue outreach call to pt for pending services.   Elliot Cousin, RN Care Management Coordinator [facility] Main Office [phone]

## 2018-07-19 NOTE — Patient Outreach (Signed)
[facility]) Care Management  07/19/2018  [name] [name] January 09, 1930 [id number]    Second attempt to reach pt successful however pt in the mist of taking a bath with the help of an aide. RN introduced the purpose of today's call and offered to call back. Pt receptive and RN will follow up later today as requested and inquire further on possible needs.  Elliot Cousin, RN Care Management Coordinator [facility] Main Office [phone]

## 2018-07-20 ENCOUNTER — Telehealth: Payer: Self-pay | Admitting: Family Medicine

## 2018-07-20 DIAGNOSIS — N183 Chronic kidney disease, stage 3 (moderate): Secondary | ICD-10-CM | POA: Diagnosis not present

## 2018-07-20 DIAGNOSIS — E1151 Type 2 diabetes mellitus with diabetic peripheral angiopathy without gangrene: Secondary | ICD-10-CM | POA: Diagnosis not present

## 2018-07-20 DIAGNOSIS — D631 Anemia in chronic kidney disease: Secondary | ICD-10-CM | POA: Diagnosis not present

## 2018-07-20 DIAGNOSIS — I270 Primary pulmonary hypertension: Secondary | ICD-10-CM | POA: Diagnosis not present

## 2018-07-20 DIAGNOSIS — M1991 Primary osteoarthritis, unspecified site: Secondary | ICD-10-CM | POA: Diagnosis not present

## 2018-07-20 DIAGNOSIS — E1122 Type 2 diabetes mellitus with diabetic chronic kidney disease: Secondary | ICD-10-CM | POA: Diagnosis not present

## 2018-07-20 DIAGNOSIS — I2510 Atherosclerotic heart disease of native coronary artery without angina pectoris: Secondary | ICD-10-CM | POA: Diagnosis not present

## 2018-07-20 DIAGNOSIS — M069 Rheumatoid arthritis, unspecified: Secondary | ICD-10-CM | POA: Diagnosis not present

## 2018-07-20 DIAGNOSIS — I130 Hypertensive heart and chronic kidney disease with heart failure and stage 1 through stage 4 chronic kidney disease, or unspecified chronic kidney disease: Secondary | ICD-10-CM | POA: Diagnosis not present

## 2018-07-20 DIAGNOSIS — I5043 Acute on chronic combined systolic (congestive) and diastolic (congestive) heart failure: Secondary | ICD-10-CM | POA: Diagnosis not present

## 2018-07-20 DIAGNOSIS — M1A09X0 Idiopathic chronic gout, multiple sites, without tophus (tophi): Secondary | ICD-10-CM | POA: Diagnosis not present

## 2018-07-20 DIAGNOSIS — I252 Old myocardial infarction: Secondary | ICD-10-CM | POA: Diagnosis not present

## 2018-07-20 NOTE — Telephone Encounter (Signed)
Copied from CRM [id number]. Topic: Quick Communication - See Telephone Encounter >> Jul 20, 2018  2:30 PM Lorayne Bender wrote: CRM for notification. See Telephone encounter for: 07/20/18.  Flor from [facility] calling to get verbal orders for PT 2x week for 2 weeks. Shon Hale can be reached at [phone], [place] to leave a message

## 2018-07-21 ENCOUNTER — Encounter: Payer: Self-pay | Admitting: Cardiovascular Disease

## 2018-07-21 ENCOUNTER — Ambulatory Visit (INDEPENDENT_AMBULATORY_CARE_PROVIDER_SITE_OTHER): Payer: Medicare Other | Admitting: Cardiovascular Disease

## 2018-07-21 VITALS — BP 144/84 | HR 90 | Ht 64.0 in | Wt 174.8 lb

## 2018-07-21 DIAGNOSIS — I10 Essential (primary) hypertension: Secondary | ICD-10-CM | POA: Diagnosis not present

## 2018-07-21 DIAGNOSIS — I5042 Chronic combined systolic (congestive) and diastolic (congestive) heart failure: Secondary | ICD-10-CM | POA: Diagnosis not present

## 2018-07-21 DIAGNOSIS — I5033 Acute on chronic diastolic (congestive) heart failure: Secondary | ICD-10-CM | POA: Diagnosis not present

## 2018-07-21 DIAGNOSIS — Z5181 Encounter for therapeutic drug level monitoring: Secondary | ICD-10-CM

## 2018-07-21 NOTE — Patient Instructions (Addendum)
Medication Instructions:  STOP LOSARTAN   START ENTRESTO 49-51 MG TWICE A DAY   Labwork: BMET IN 1 WEEK  Testing/Procedures: Your physician has requested that you have an echocardiogram. Echocardiography is a painless test that uses sound waves to create images of your heart. It provides your doctor with information about the size and shape of your heart and how well your heart's chambers and valves are working. This procedure takes approximately one hour. There are no restrictions for this procedure. MID October [facility] AT [address]   Follow-Up: Your physician recommends that you schedule a follow-up appointment [id number] AFTER ECHO   Any Other Special Instructions Will Be Listed Below (If Applicable).  MONITOR BLOOD PRESSURE AND CALL NEXT WEEK  [phone]) WITH YOUR BLOOD PRESSURE READINGS SO DR [place] CAN ADJUST ENTRESTO IF NEEDED   If you need a refill on your cardiac medications before your next appointment, please call your pharmacy.

## 2018-07-21 NOTE — Progress Notes (Signed)
6   Cardiology Office Note   Date:  07/21/2018   ID:  [name] [name], DOB 07-25-1930, MRN [id number]  PCP:  Helane Rima, DO  Cardiologist:   Chilton Si, MD   No chief complaint on file.   History of Present Illness: [name] [name] is a 83 y.o. female with systolic and diastolic heart failure, hypertension, Rheumatoid Arthritis, diabetes type 2, CAD s/p MI, and CVA who presents for follow up.  She was initially seen in 2016 for management of chronic diastolic heart failure.  [name] [name] was admitted to the [facility] Medicine service 11/1-11/3 with chest pain.  At that time she reported constant chest pain that began in the setting of a cold.  CT scan of the chest was concerning for edema.  She received IV lasix and diuresed with improvement in her breathing.  She was also started on oral lasix.  At her initial appointment she was feeling well.  She was admitted 05/2018 with weakness, chest discomfort, and shortness of breath.  She was found to have pneumonia and heart failure.  BNP was also elevated to 2000 and troponin was mildly elevated to 0.06.  Echocardiogram 05/29/2018 revealed LVEF 20 to 25% and moderate pulmonary hypertension.  She underwent left and right heart catheterization and had no obstructive CAD.  [name] [name] was admitted 06/2018 with gallstone pancreatitis.  Cardiology was consulted and she was felt to be at acceptable risk for surgery.  She underwent laparoscopic cholecystectomy.  She has recovered quite well.  Since discharge she has been feeling well.  She feels stronger each day.  She is still eating small meals and has little appetite.  She is doing physical therapy and trying to walk around her home.  She did have a 3 pound increase in her weight.  She was discharged on Lasix 40 mg but due to confusion was only taking 20 mg.  After increasing dose to 40 mg her weight has gone back to her baseline of 170 to 171 pounds.  She no longer has lower extremity edema.  She  denies orthopnea or PND.  She has not had any chest pain or abdominal pain.  Overall she is doing well.     Past Medical History:  Diagnosis Date  . Allergic rhinitis due to pollen 04/27/2007  . Arthritis    "in q joint" (08/07/2013)  . CHF (congestive heart failure) (HCC)   . Coronary atherosclerosis of native coronary artery   . Cough   . Disorder of bone and cartilage, unspecified   . Edema 05/03/2013  . Exertional shortness of breath    "sometimes" (08/07/2013)  . First degree atrioventricular block   . Gout, unspecified 04/19/2013  . Hyperlipidemia 04/27/2007  . Hypertension   . Muscle spasm   . Muscle weakness (generalized)   . Myocardial infarction (HCC) ~ 1970  . Onychia and paronychia of toe   . Osteoarthrosis, unspecified whether generalized or localized, unspecified site 04/27/2007  . Other fall   . Other malaise and fatigue   . Pneumonia 05/2018  . Prepatellar bursitis   . Seizures (HCC)   . Stroke (HCC) 01/17/2014  . TIA (transient ischemic attack)    "a few one summer" (08/07/2013)  . Type II diabetes mellitus (HCC)    "fasting 90-110s" (08/07/2013)  . Unspecified essential hypertension 08/08/2013  . Unspecified vitamin D deficiency     Past Surgical History:  Procedure Laterality Date  . ABDOMINAL HYSTERECTOMY  04/1980  . APPENDECTOMY  1960  .  CARDIAC CATHETERIZATION  2003  . CATARACT EXTRACTION W/ INTRAOCULAR LENS  IMPLANT, BILATERAL Bilateral ~ 2012  . CHOLECYSTECTOMY N/A 06/26/2018   Procedure: LAPAROSCOPIC CHOLECYSTECTOMY POSSIBLE INTRAOPERATIVE CHOLANGIOGRAM;  Surgeon: Abigail Miyamoto, MD;  Location: [facility] OR;  Service: General;  Laterality: N/A;  . JOINT REPLACEMENT    . REPLACEMENT TOTAL KNEE Right 09/2005  . RIGHT/LEFT HEART CATH AND CORONARY ANGIOGRAPHY N/A 06/01/2018   Procedure: RIGHT/LEFT HEART CATH AND CORONARY ANGIOGRAPHY;  Surgeon: Corky Crafts, MD;  Location: [facility] INVASIVE CV LAB;  Service: Cardiovascular;  Laterality: N/A;  . SHOULDER  OPEN ROTATOR CUFF REPAIR Right 07/1999  . TONSILLECTOMY  08/1974  . TOTAL KNEE ARTHROPLASTY Left 08/07/2013   Procedure: TOTAL KNEE ARTHROPLASTY- LEFT;  Surgeon: Dannielle Huh, MD;  Location: [facility] OR;  Service: Orthopedics;  Laterality: Left;  . TRANSTHORACIC ECHOCARDIOGRAM  2003   EF 55-65%; mild concentric LVH     Current Outpatient Medications  Medication Sig Dispense Refill  . albuterol (PROVENTIL HFA;VENTOLIN HFA) 108 (90 Base) MCG/ACT inhaler Inhale 1 puff into the lungs every 6 (six) hours as needed for wheezing or shortness of breath.    . allopurinol (ZYLOPRIM) 100 MG tablet Take 100 mg by mouth daily.     Marland Kitchen aspirin EC 81 MG tablet Take 81 mg by mouth daily.    . calcium-vitamin D (OSCAL WITH D) 500-200 MG-UNIT tablet Take 1 tablet by mouth daily with breakfast.    . carvedilol (COREG) 25 MG tablet Take 1 tablet (25 mg total) by mouth 2 (two) times daily with a meal. 60 tablet 0  . D3-1000 1000 units capsule Take 1,000 Units by mouth daily.  0  . diclofenac sodium (VOLTAREN) 1 % GEL Apply 1 application topically daily. 100 g 4  . ferrous sulfate 325 (65 FE) MG tablet Take 1 tablet (325 mg total) by mouth 2 (two) times daily with a meal. 60 tablet 0  . furosemide (LASIX) 40 MG tablet Take 1 tablet (40 mg total) by mouth daily. 30 tablet 0  . gabapentin (NEURONTIN) 100 MG capsule Take 1 capsule (100 mg total) by mouth at bedtime. 30 capsule 3  . hydrALAZINE (APRESOLINE) 100 MG tablet TAKE 1 TABLET BY MOUTH THREE TIMES DAILY 90 tablet 2  . HYDROcodone-acetaminophen (NORCO/VICODIN) 5-325 MG tablet Take 1 tablet by mouth every 4 (four) hours as needed for moderate pain.    . hydroxychloroquine (PLAQUENIL) 200 MG tablet Take 200 mg by mouth 2 (two) times daily.    . hydroxypropyl methylcellulose / hypromellose (ISOPTO TEARS / GONIOVISC) 2.5 % ophthalmic solution Place 1 drop into both eyes 4 (four) times daily as needed for dry eyes. (Patient taking differently: Place 1 drop into both eyes 4  (four) times daily as needed for dry eyes. Pt uses about once daily.) 15 mL 2  . isosorbide mononitrate (IMDUR) 30 MG 24 hr tablet Take 1 tablet (30 mg total) by mouth daily. 30 tablet 0  . leflunomide (ARAVA) 20 MG tablet Take 20 mg by mouth daily.    . metFORMIN (GLUCOPHAGE) 500 MG tablet Take 1 tablet (500 mg total) by mouth daily with breakfast. 90 tablet 2  . Olopatadine HCl 0.2 % SOLN Apply 1 drop to eye daily. 2.5 mL 3  . potassium chloride SA (K-DUR,KLOR-CON) 20 MEQ tablet Take 2 tablets (40 mEq total) by mouth daily.    . predniSONE (DELTASONE) 10 MG tablet Take 1 tablet (10 mg total) by mouth daily with breakfast. 5 tablet 0  . sacubitril-valsartan (  ENTRESTO) 49-51 MG Take 1 tablet by mouth 2 (two) times daily.    Marland Kitchen spironolactone (ALDACTONE) 25 MG tablet Take 12.5 mg by mouth daily.    Marland Kitchen triamcinolone cream (KENALOG) 0.1 % Apply 1 application topically 2 (two) times daily.    . vitamin C (ASCORBIC ACID) 500 MG tablet Take 500 mg by mouth daily.     No current facility-administered medications for this visit.     Allergies:   Clonidine derivatives; Fish allergy; Shellfish allergy; Doxycycline; Indomethacin; Lyrica [pregabalin]; Methyldopa; Orange fruit [citrus]; Strawberry (diagnostic); Cetirizine hcl; Codeine; Levaquin [levofloxacin in d5w]; and Tomato    Social History:  The patient  reports that she has never smoked. She has never used smokeless tobacco. She reports that she does not drink alcohol or use drugs.   Family History:  The patient's family history includes Heart attack in her father.    ROS:  Please see the history of present illness.   Otherwise, review of systems are positive for allergies.   All other systems are reviewed and negative.    PHYSICAL EXAM: VS:  BP (!) 144/84   Pulse 90   Ht 5\' 4"  (1.626 m)   Wt 174 lb 12.8 oz (79.3 kg)   SpO2 97%   BMI 30.00 kg/m  , BMI Body mass index is 30 kg/m. GENERAL:  Well appearing HEENT: Pupils equal round and  reactive, fundi not visualized, oral mucosa unremarkable NECK:  No jugular venous distention, waveform within normal limits, carotid upstroke brisk and symmetric, no bruits LUNGS:  Clear to auscultation bilaterally HEART:  RRR.  PMI not displaced or sustained,S1 and S2 within normal limits, no S3, no S4, no clicks, no rubs, no murmurs ABD:  Flat, positive bowel sounds normal in frequency in pitch, no bruits, no rebound, no guarding, no midline pulsatile mass, no hepatomegaly, no splenomegaly EXT:  2 plus pulses throughout, no edema, no cyanosis no clubbing SKIN:  No rashes no nodules NEURO:  Cranial nerves II through XII grossly intact, motor grossly intact throughout PSYCH:  Cognitively intact, oriented to person place and time   EKG:  EKG is not ordered today.  LHC 06/01/18:  LV end diastolic pressure is moderately elevated.  There is no aortic valve stenosis.  Hemodynamic findings consistent with moderate pulmonary hypertension.  CO 5.68 L/min; CI 2.97; Ao sat 99%; PA sat 65%; mean PA 42 mm Hg; mean PCWP 28 mm Hg  Nonobstructive CAD.   Nonischemic cardiomyopathy with mild to moderate CAD.   Echo 05/29/18: Study Conclusions  - Left ventricle: The cavity size was normal. Wall thickness was   increased in a pattern of moderate LVH. Systolic function was   severely reduced. The estimated ejection fraction was in the   range of 20% to 25%. Although no diagnostic regional wall motion   abnormality was identified, this possibility cannot be completely   excluded on the basis of this study. - Aortic valve: Mildly calcified annulus. Trileaflet; normal   thickness, moderately calcified leaflets. There was mild   regurgitation. - Mitral valve: Mildly calcified annulus. Mildly calcified leaflets   . There was mild to moderate regurgitation. - Left atrium: The atrium was moderately to severely dilated. - Right ventricle: The cavity size was mildly dilated. Wall   thickness was normal.  Systolic function was mildly reduced. - Right atrium: The atrium was moderately dilated. - Atrial septum: There was a small patent foramen ovale. There was   a left-to-right shunt. - Tricuspid valve: There  was moderate regurgitation. - Pulmonary arteries: Systolic pressure was moderately increased.   PA peak pressure: 47 mm Hg (S). - Pericardium, extracardiac: A trivial pericardial effusion was   identified.  Echo 01/18/14: Study Conclusions  - Left ventricle: The cavity size was normal. Wall thickness was increased in a pattern of moderate to severe LVH. There was moderate focal basal hypertrophy of the septum. Systolic function was normal. The estimated ejection fraction was in the range of 60% to 65%. Wall motion was normal; there were no regional wall motion abnormalities. Doppler parameters are consistent with abnormal left ventricular relaxation (grade 1 diastolic dysfunction). - Left atrium: The atrium was moderately dilated. - Pulmonary arteries: Systolic pressure was mildly increased.  Carotid ultrasound 01/18/14: Summary: Bilateral: intimal wall thickening CCA. 1-39% ICA stenosis. Vertebral artery flow is antegrade.   Recent Labs: 05/30/2018: Magnesium 1.7 06/22/2018: B Natriuretic Peptide 1,146.7 06/23/2018: TSH 1.742 06/26/2018: ALT 80 07/04/2018: BUN 40; Creatinine 1.2; Potassium 3.9; Sodium 142 07/12/2018: Hemoglobin 11.2; Platelets 289    Lipid Panel    Component Value Date/Time   CHOL 202 (H) 06/25/2018 0607   CHOL 185 03/04/2016 1620   TRIG 67 06/25/2018 0607   HDL 63 06/25/2018 0607   HDL 73 03/04/2016 1620   CHOLHDL 3.2 06/25/2018 0607   VLDL 13 06/25/2018 0607   LDLCALC 126 (H) 06/25/2018 0607   LDLCALC 89 03/04/2016 1620      Wt Readings from Last 3 Encounters:  07/21/18 174 lb 12.8 oz (79.3 kg)  07/12/18 172 lb (78 kg)  06/28/18 172 lb (78 kg)      ASSESSMENT AND PLAN:  # Chronic systolic and diastolic heart failure:  Ms.  [name] is currently euvolemic.  Increasing the Lasix helped.  She will continue on 40 mg daily.  We will switch losartan to Entresto 49/51 mg.  Continue carvedilol, hydralazine, isosorbide mononitrate, and spironolactone.  We will repeat an echo 08/2018.  She did not have any obstructive CAD on cath.  # Hypertension: BP slightly above goal.  Switch losartan to Entresto as above.  Check BMP in 1 week.  # Chest pain: Resolved.  Likely related to cold symptoms and volume overload.  We will not pursue an ischemia evaluation at this time.  # CAD s/p MI: [name] [name] had an MI in the 11s.  No obstructive CAD on cath.  She is not interested continue aspirin and carvedilol.  Current medicines are reviewed at length with the patient today.  The patient does not have concerns regarding medicines.  The following changes have been made:  no change  Labs/ tests ordered today include:   Orders Placed This Encounter  Procedures  . Basic metabolic panel  . ECHOCARDIOGRAM COMPLETE     Disposition:   FU with Alistar Mcenery C. Duke Salvia, MD, [facility] in 1 month.   Signed, Nely Dedmon C. Duke Salvia, MD, [facility]  07/21/2018 1:21 PM    [facility]

## 2018-07-21 NOTE — Telephone Encounter (Signed)
Called and left detailed message for verbal ok.

## 2018-07-22 DIAGNOSIS — M1991 Primary osteoarthritis, unspecified site: Secondary | ICD-10-CM | POA: Diagnosis not present

## 2018-07-22 DIAGNOSIS — E1122 Type 2 diabetes mellitus with diabetic chronic kidney disease: Secondary | ICD-10-CM | POA: Diagnosis not present

## 2018-07-22 DIAGNOSIS — I270 Primary pulmonary hypertension: Secondary | ICD-10-CM | POA: Diagnosis not present

## 2018-07-22 DIAGNOSIS — D631 Anemia in chronic kidney disease: Secondary | ICD-10-CM | POA: Diagnosis not present

## 2018-07-22 DIAGNOSIS — E1151 Type 2 diabetes mellitus with diabetic peripheral angiopathy without gangrene: Secondary | ICD-10-CM | POA: Diagnosis not present

## 2018-07-22 DIAGNOSIS — M069 Rheumatoid arthritis, unspecified: Secondary | ICD-10-CM | POA: Diagnosis not present

## 2018-07-22 DIAGNOSIS — I252 Old myocardial infarction: Secondary | ICD-10-CM | POA: Diagnosis not present

## 2018-07-22 DIAGNOSIS — I130 Hypertensive heart and chronic kidney disease with heart failure and stage 1 through stage 4 chronic kidney disease, or unspecified chronic kidney disease: Secondary | ICD-10-CM | POA: Diagnosis not present

## 2018-07-22 DIAGNOSIS — I5043 Acute on chronic combined systolic (congestive) and diastolic (congestive) heart failure: Secondary | ICD-10-CM | POA: Diagnosis not present

## 2018-07-22 DIAGNOSIS — N183 Chronic kidney disease, stage 3 (moderate): Secondary | ICD-10-CM | POA: Diagnosis not present

## 2018-07-22 DIAGNOSIS — I2510 Atherosclerotic heart disease of native coronary artery without angina pectoris: Secondary | ICD-10-CM | POA: Diagnosis not present

## 2018-07-22 DIAGNOSIS — M1A09X0 Idiopathic chronic gout, multiple sites, without tophus (tophi): Secondary | ICD-10-CM | POA: Diagnosis not present

## 2018-07-23 ENCOUNTER — Encounter (HOSPITAL_COMMUNITY): Payer: Self-pay | Admitting: Emergency Medicine

## 2018-07-23 ENCOUNTER — Other Ambulatory Visit: Payer: Self-pay

## 2018-07-23 ENCOUNTER — Emergency Department (HOSPITAL_COMMUNITY): Payer: Medicare Other

## 2018-07-23 ENCOUNTER — Inpatient Hospital Stay (HOSPITAL_COMMUNITY)
Admission: EM | Admit: 2018-07-23 | Discharge: 2018-07-27 | DRG: 176 | Disposition: A | Discharge status: Skilled Nursing Facility | Payer: Medicare Other | Attending: Internal Medicine | Admitting: Internal Medicine

## 2018-07-23 DIAGNOSIS — D649 Anemia, unspecified: Secondary | ICD-10-CM | POA: Diagnosis not present

## 2018-07-23 DIAGNOSIS — E1129 Type 2 diabetes mellitus with other diabetic kidney complication: Secondary | ICD-10-CM | POA: Diagnosis present

## 2018-07-23 DIAGNOSIS — Z881 Allergy status to other antibiotic agents status: Secondary | ICD-10-CM

## 2018-07-23 DIAGNOSIS — Z96653 Presence of artificial knee joint, bilateral: Secondary | ICD-10-CM | POA: Diagnosis present

## 2018-07-23 DIAGNOSIS — T447X5A Adverse effect of beta-adrenoreceptor antagonists, initial encounter: Secondary | ICD-10-CM | POA: Diagnosis present

## 2018-07-23 DIAGNOSIS — N183 Chronic kidney disease, stage 3 unspecified: Secondary | ICD-10-CM | POA: Diagnosis present

## 2018-07-23 DIAGNOSIS — R61 Generalized hyperhidrosis: Secondary | ICD-10-CM | POA: Diagnosis not present

## 2018-07-23 DIAGNOSIS — I2609 Other pulmonary embolism with acute cor pulmonale: Secondary | ICD-10-CM | POA: Diagnosis not present

## 2018-07-23 DIAGNOSIS — M25519 Pain in unspecified shoulder: Secondary | ICD-10-CM | POA: Diagnosis not present

## 2018-07-23 DIAGNOSIS — Z7952 Long term (current) use of systemic steroids: Secondary | ICD-10-CM

## 2018-07-23 DIAGNOSIS — M0579 Rheumatoid arthritis with rheumatoid factor of multiple sites without organ or systems involvement: Secondary | ICD-10-CM | POA: Diagnosis not present

## 2018-07-23 DIAGNOSIS — R55 Syncope and collapse: Secondary | ICD-10-CM | POA: Diagnosis present

## 2018-07-23 DIAGNOSIS — Z7984 Long term (current) use of oral hypoglycemic drugs: Secondary | ICD-10-CM

## 2018-07-23 DIAGNOSIS — Z79899 Other long term (current) drug therapy: Secondary | ICD-10-CM

## 2018-07-23 DIAGNOSIS — E1122 Type 2 diabetes mellitus with diabetic chronic kidney disease: Secondary | ICD-10-CM | POA: Diagnosis not present

## 2018-07-23 DIAGNOSIS — M069 Rheumatoid arthritis, unspecified: Secondary | ICD-10-CM | POA: Diagnosis not present

## 2018-07-23 DIAGNOSIS — I82491 Acute embolism and thrombosis of other specified deep vein of right lower extremity: Secondary | ICD-10-CM | POA: Diagnosis not present

## 2018-07-23 DIAGNOSIS — I952 Hypotension due to drugs: Secondary | ICD-10-CM | POA: Diagnosis not present

## 2018-07-23 DIAGNOSIS — M109 Gout, unspecified: Secondary | ICD-10-CM | POA: Diagnosis present

## 2018-07-23 DIAGNOSIS — E1121 Type 2 diabetes mellitus with diabetic nephropathy: Secondary | ICD-10-CM | POA: Diagnosis present

## 2018-07-23 DIAGNOSIS — Z888 Allergy status to other drugs, medicaments and biological substances status: Secondary | ICD-10-CM | POA: Diagnosis not present

## 2018-07-23 DIAGNOSIS — R531 Weakness: Secondary | ICD-10-CM | POA: Diagnosis not present

## 2018-07-23 DIAGNOSIS — M797 Fibromyalgia: Secondary | ICD-10-CM | POA: Diagnosis not present

## 2018-07-23 DIAGNOSIS — Z91018 Allergy to other foods: Secondary | ICD-10-CM

## 2018-07-23 DIAGNOSIS — I252 Old myocardial infarction: Secondary | ICD-10-CM | POA: Diagnosis not present

## 2018-07-23 DIAGNOSIS — E785 Hyperlipidemia, unspecified: Secondary | ICD-10-CM | POA: Diagnosis not present

## 2018-07-23 DIAGNOSIS — I130 Hypertensive heart and chronic kidney disease with heart failure and stage 1 through stage 4 chronic kidney disease, or unspecified chronic kidney disease: Secondary | ICD-10-CM | POA: Diagnosis not present

## 2018-07-23 DIAGNOSIS — I351 Nonrheumatic aortic (valve) insufficiency: Secondary | ICD-10-CM | POA: Diagnosis not present

## 2018-07-23 DIAGNOSIS — K5730 Diverticulosis of large intestine without perforation or abscess without bleeding: Secondary | ICD-10-CM | POA: Diagnosis not present

## 2018-07-23 DIAGNOSIS — Z8673 Personal history of transient ischemic attack (TIA), and cerebral infarction without residual deficits: Secondary | ICD-10-CM

## 2018-07-23 DIAGNOSIS — Z885 Allergy status to narcotic agent status: Secondary | ICD-10-CM | POA: Diagnosis not present

## 2018-07-23 DIAGNOSIS — I5042 Chronic combined systolic (congestive) and diastolic (congestive) heart failure: Secondary | ICD-10-CM | POA: Diagnosis present

## 2018-07-23 DIAGNOSIS — I10 Essential (primary) hypertension: Secondary | ICD-10-CM | POA: Diagnosis present

## 2018-07-23 DIAGNOSIS — T465X5A Adverse effect of other antihypertensive drugs, initial encounter: Secondary | ICD-10-CM | POA: Diagnosis present

## 2018-07-23 DIAGNOSIS — I2699 Other pulmonary embolism without acute cor pulmonale: Secondary | ICD-10-CM | POA: Diagnosis present

## 2018-07-23 DIAGNOSIS — Z91013 Allergy to seafood: Secondary | ICD-10-CM

## 2018-07-23 DIAGNOSIS — I2510 Atherosclerotic heart disease of native coronary artery without angina pectoris: Secondary | ICD-10-CM | POA: Diagnosis not present

## 2018-07-23 DIAGNOSIS — M0609 Rheumatoid arthritis without rheumatoid factor, multiple sites: Secondary | ICD-10-CM

## 2018-07-23 DIAGNOSIS — H1013 Acute atopic conjunctivitis, bilateral: Secondary | ICD-10-CM

## 2018-07-23 LAB — I-STAT CHEM 8, ED
BUN: 57 mg/dL — ABNORMAL HIGH (ref 8–23)
Calcium, Ion: 1.13 mmol/L — ABNORMAL LOW (ref 1.15–1.40)
Chloride: 103 mmol/L (ref 98–111)
Creatinine, Ser: 1.2 mg/dL — ABNORMAL HIGH (ref 0.44–1.00)
Glucose, Bld: 149 mg/dL — ABNORMAL HIGH (ref 70–99)
HCT: 38 % (ref 36.0–46.0)
Hemoglobin: 12.9 g/dL (ref 12.0–15.0)
Potassium: 4.5 mmol/L (ref 3.5–5.1)
Sodium: 137 mmol/L (ref 135–145)
TCO2: 26 mmol/L (ref 22–32)

## 2018-07-23 LAB — LIPASE, BLOOD: Lipase: 50 U/L (ref 11–51)

## 2018-07-23 LAB — CBC WITH DIFFERENTIAL/PLATELET
Abs Immature Granulocytes: 0.1 10*3/uL (ref 0.0–0.1)
Basophils Absolute: 0.1 10*3/uL (ref 0.0–0.1)
Basophils Relative: 1 %
Eosinophils Absolute: 0.1 10*3/uL (ref 0.0–0.7)
Eosinophils Relative: 1 %
HCT: 37.4 % (ref 36.0–46.0)
Hemoglobin: 11.7 g/dL — ABNORMAL LOW (ref 12.0–15.0)
Immature Granulocytes: 1 %
Lymphocytes Relative: 34 %
Lymphs Abs: 3.3 10*3/uL (ref 0.7–4.0)
MCH: 29.7 pg (ref 26.0–34.0)
MCHC: 31.3 g/dL (ref 30.0–36.0)
MCV: 94.9 fL (ref 78.0–100.0)
Monocytes Absolute: 1 10*3/uL (ref 0.1–1.0)
Monocytes Relative: 10 %
Neutro Abs: 5.1 10*3/uL (ref 1.7–7.7)
Neutrophils Relative %: 53 %
Platelets: 287 10*3/uL (ref 150–400)
RBC: 3.94 MIL/uL (ref 3.87–5.11)
RDW: 20.3 % — ABNORMAL HIGH (ref 11.5–15.5)
WBC: 9.6 10*3/uL (ref 4.0–10.5)

## 2018-07-23 LAB — COMPREHENSIVE METABOLIC PANEL
ALT: 8 U/L (ref 0–44)
AST: 23 U/L (ref 15–41)
Albumin: 3.4 g/dL — ABNORMAL LOW (ref 3.5–5.0)
Alkaline Phosphatase: 61 U/L (ref 38–126)
Anion gap: 11 (ref 5–15)
BUN: 39 mg/dL — ABNORMAL HIGH (ref 8–23)
CO2: 21 mmol/L — ABNORMAL LOW (ref 22–32)
Calcium: 9.1 mg/dL (ref 8.9–10.3)
Chloride: 102 mmol/L (ref 98–111)
Creatinine, Ser: 1.22 mg/dL — ABNORMAL HIGH (ref 0.44–1.00)
GFR calc Af Amer: 45 mL/min — ABNORMAL LOW (ref >60)
GFR calc non Af Amer: 39 mL/min — ABNORMAL LOW (ref >60)
Glucose, Bld: 149 mg/dL — ABNORMAL HIGH (ref 70–99)
Potassium: 4.5 mmol/L (ref 3.5–5.1)
Sodium: 134 mmol/L — ABNORMAL LOW (ref 135–145)
Total Bilirubin: 0.8 mg/dL (ref 0.3–1.2)
Total Protein: 6.1 g/dL — ABNORMAL LOW (ref 6.5–8.1)

## 2018-07-23 LAB — I-STAT TROPONIN, ED: Troponin i, poc: 0.02 ng/mL (ref 0.00–0.08)

## 2018-07-23 LAB — TROPONIN I: Troponin I: 0.03 ng/mL (ref <0.03)

## 2018-07-23 LAB — I-STAT CG4 LACTIC ACID, ED: Lactic Acid, Venous: 1.72 mmol/L (ref 0.5–1.9)

## 2018-07-23 LAB — BRAIN NATRIURETIC PEPTIDE: B Natriuretic Peptide: 138.3 pg/mL — ABNORMAL HIGH (ref 0.0–100.0)

## 2018-07-23 MED ORDER — SODIUM CHLORIDE 0.9 % IV BOLUS
1000.0000 mL | Freq: Once | INTRAVENOUS | Status: AC
  Administered 2018-07-23: 1000 mL via INTRAVENOUS

## 2018-07-23 MED ORDER — ONDANSETRON HCL 4 MG/2ML IJ SOLN
4.0000 mg | Freq: Once | INTRAMUSCULAR | Status: AC
  Administered 2018-07-23: 4 mg via INTRAVENOUS
  Filled 2018-07-23: qty 2

## 2018-07-23 MED ORDER — SODIUM CHLORIDE 0.9 % IV BOLUS
500.0000 mL | Freq: Once | INTRAVENOUS | Status: AC
  Administered 2018-07-23: 500 mL via INTRAVENOUS

## 2018-07-23 MED ORDER — MORPHINE SULFATE (PF) 2 MG/ML IV SOLN
2.0000 mg | Freq: Once | INTRAVENOUS | Status: AC
  Administered 2018-07-23: 2 mg via INTRAVENOUS
  Filled 2018-07-23: qty 1

## 2018-07-23 NOTE — ED Triage Notes (Signed)
Pt from home c/o generalized weakness with left shoulder pain. Pt states she has rheumatoid arthritis in shoulders. States weakness has gotten worse throughout the day. Pt has hx of strokes, no neuro deficits. Pt c/o burning in chest in hall and pt diaphoretic. 132/84, 88 HR, resp 18, CBG 176. EMS states pt cold all the way here before complaining of the burning in her chest

## 2018-07-23 NOTE — ED Notes (Signed)
EDP to bedside, pt less responsive during changing closed, pt placed in [place] and feels

## 2018-07-24 ENCOUNTER — Other Ambulatory Visit: Payer: Self-pay

## 2018-07-24 ENCOUNTER — Emergency Department (HOSPITAL_COMMUNITY): Payer: Medicare Other

## 2018-07-24 ENCOUNTER — Encounter (HOSPITAL_COMMUNITY): Payer: Medicare Other

## 2018-07-24 DIAGNOSIS — M25511 Pain in right shoulder: Secondary | ICD-10-CM | POA: Diagnosis not present

## 2018-07-24 DIAGNOSIS — I2510 Atherosclerotic heart disease of native coronary artery without angina pectoris: Secondary | ICD-10-CM

## 2018-07-24 DIAGNOSIS — I2699 Other pulmonary embolism without acute cor pulmonale: Secondary | ICD-10-CM | POA: Diagnosis not present

## 2018-07-24 DIAGNOSIS — J810 Acute pulmonary edema: Secondary | ICD-10-CM | POA: Diagnosis not present

## 2018-07-24 DIAGNOSIS — D649 Anemia, unspecified: Secondary | ICD-10-CM | POA: Diagnosis not present

## 2018-07-24 DIAGNOSIS — Z881 Allergy status to other antibiotic agents status: Secondary | ICD-10-CM | POA: Diagnosis not present

## 2018-07-24 DIAGNOSIS — Z96653 Presence of artificial knee joint, bilateral: Secondary | ICD-10-CM | POA: Diagnosis present

## 2018-07-24 DIAGNOSIS — K5730 Diverticulosis of large intestine without perforation or abscess without bleeding: Secondary | ICD-10-CM | POA: Diagnosis not present

## 2018-07-24 DIAGNOSIS — K819 Cholecystitis, unspecified: Secondary | ICD-10-CM | POA: Diagnosis not present

## 2018-07-24 DIAGNOSIS — E785 Hyperlipidemia, unspecified: Secondary | ICD-10-CM | POA: Diagnosis present

## 2018-07-24 DIAGNOSIS — N179 Acute kidney failure, unspecified: Secondary | ICD-10-CM | POA: Diagnosis not present

## 2018-07-24 DIAGNOSIS — I5042 Chronic combined systolic (congestive) and diastolic (congestive) heart failure: Secondary | ICD-10-CM | POA: Diagnosis not present

## 2018-07-24 DIAGNOSIS — I351 Nonrheumatic aortic (valve) insufficiency: Secondary | ICD-10-CM | POA: Diagnosis not present

## 2018-07-24 DIAGNOSIS — R488 Other symbolic dysfunctions: Secondary | ICD-10-CM | POA: Diagnosis not present

## 2018-07-24 DIAGNOSIS — I160 Hypertensive urgency: Secondary | ICD-10-CM | POA: Diagnosis not present

## 2018-07-24 DIAGNOSIS — Z8673 Personal history of transient ischemic attack (TIA), and cerebral infarction without residual deficits: Secondary | ICD-10-CM | POA: Diagnosis not present

## 2018-07-24 DIAGNOSIS — M0579 Rheumatoid arthritis with rheumatoid factor of multiple sites without organ or systems involvement: Secondary | ICD-10-CM

## 2018-07-24 DIAGNOSIS — R262 Difficulty in walking, not elsewhere classified: Secondary | ICD-10-CM | POA: Diagnosis not present

## 2018-07-24 DIAGNOSIS — I420 Dilated cardiomyopathy: Secondary | ICD-10-CM | POA: Diagnosis not present

## 2018-07-24 DIAGNOSIS — I952 Hypotension due to drugs: Secondary | ICD-10-CM | POA: Diagnosis present

## 2018-07-24 DIAGNOSIS — Z79899 Other long term (current) drug therapy: Secondary | ICD-10-CM | POA: Diagnosis not present

## 2018-07-24 DIAGNOSIS — I130 Hypertensive heart and chronic kidney disease with heart failure and stage 1 through stage 4 chronic kidney disease, or unspecified chronic kidney disease: Secondary | ICD-10-CM | POA: Diagnosis present

## 2018-07-24 DIAGNOSIS — T447X5A Adverse effect of beta-adrenoreceptor antagonists, initial encounter: Secondary | ICD-10-CM | POA: Diagnosis present

## 2018-07-24 DIAGNOSIS — M797 Fibromyalgia: Secondary | ICD-10-CM | POA: Diagnosis present

## 2018-07-24 DIAGNOSIS — N183 Chronic kidney disease, stage 3 (moderate): Secondary | ICD-10-CM | POA: Diagnosis not present

## 2018-07-24 DIAGNOSIS — Z7952 Long term (current) use of systemic steroids: Secondary | ICD-10-CM | POA: Diagnosis not present

## 2018-07-24 DIAGNOSIS — M25512 Pain in left shoulder: Secondary | ICD-10-CM | POA: Diagnosis not present

## 2018-07-24 DIAGNOSIS — Z888 Allergy status to other drugs, medicaments and biological substances status: Secondary | ICD-10-CM | POA: Diagnosis not present

## 2018-07-24 DIAGNOSIS — M069 Rheumatoid arthritis, unspecified: Secondary | ICD-10-CM | POA: Diagnosis not present

## 2018-07-24 DIAGNOSIS — I82491 Acute embolism and thrombosis of other specified deep vein of right lower extremity: Secondary | ICD-10-CM | POA: Diagnosis present

## 2018-07-24 DIAGNOSIS — T465X5A Adverse effect of other antihypertensive drugs, initial encounter: Secondary | ICD-10-CM | POA: Diagnosis present

## 2018-07-24 DIAGNOSIS — M109 Gout, unspecified: Secondary | ICD-10-CM | POA: Diagnosis not present

## 2018-07-24 DIAGNOSIS — I10 Essential (primary) hypertension: Secondary | ICD-10-CM | POA: Diagnosis not present

## 2018-07-24 DIAGNOSIS — E1122 Type 2 diabetes mellitus with diabetic chronic kidney disease: Secondary | ICD-10-CM | POA: Diagnosis not present

## 2018-07-24 DIAGNOSIS — Z885 Allergy status to narcotic agent status: Secondary | ICD-10-CM | POA: Diagnosis not present

## 2018-07-24 DIAGNOSIS — I129 Hypertensive chronic kidney disease with stage 1 through stage 4 chronic kidney disease, or unspecified chronic kidney disease: Secondary | ICD-10-CM | POA: Diagnosis not present

## 2018-07-24 DIAGNOSIS — M6281 Muscle weakness (generalized): Secondary | ICD-10-CM | POA: Diagnosis not present

## 2018-07-24 DIAGNOSIS — R55 Syncope and collapse: Secondary | ICD-10-CM | POA: Diagnosis not present

## 2018-07-24 DIAGNOSIS — I2609 Other pulmonary embolism with acute cor pulmonale: Secondary | ICD-10-CM

## 2018-07-24 DIAGNOSIS — Z48815 Encounter for surgical aftercare following surgery on the digestive system: Secondary | ICD-10-CM | POA: Diagnosis not present

## 2018-07-24 DIAGNOSIS — D631 Anemia in chronic kidney disease: Secondary | ICD-10-CM | POA: Diagnosis not present

## 2018-07-24 DIAGNOSIS — I252 Old myocardial infarction: Secondary | ICD-10-CM | POA: Diagnosis not present

## 2018-07-24 DIAGNOSIS — E1121 Type 2 diabetes mellitus with diabetic nephropathy: Secondary | ICD-10-CM | POA: Diagnosis not present

## 2018-07-24 LAB — GLUCOSE, CAPILLARY
Glucose-Capillary: 125 mg/dL — ABNORMAL HIGH (ref 70–99)
Glucose-Capillary: 130 mg/dL — ABNORMAL HIGH (ref 70–99)
Glucose-Capillary: 150 mg/dL — ABNORMAL HIGH (ref 70–99)
Glucose-Capillary: 180 mg/dL — ABNORMAL HIGH (ref 70–99)
Glucose-Capillary: 126 mg/dL — ABNORMAL HIGH (ref 70–99)

## 2018-07-24 LAB — HEPARIN LEVEL (UNFRACTIONATED)
Heparin Unfractionated: 0.53 IU/mL (ref 0.30–0.70)
Heparin Unfractionated: 0.54 IU/mL (ref 0.30–0.70)

## 2018-07-24 LAB — BASIC METABOLIC PANEL
Anion gap: 12 (ref 5–15)
BUN: 30 mg/dL — ABNORMAL HIGH (ref 8–23)
CO2: 21 mmol/L — ABNORMAL LOW (ref 22–32)
Calcium: 9 mg/dL (ref 8.9–10.3)
Chloride: 103 mmol/L (ref 98–111)
Creatinine, Ser: 0.97 mg/dL (ref 0.44–1.00)
GFR calc Af Amer: 60 mL/min — ABNORMAL LOW (ref >60)
GFR calc non Af Amer: 51 mL/min — ABNORMAL LOW (ref >60)
Glucose, Bld: 128 mg/dL — ABNORMAL HIGH (ref 70–99)
Potassium: 4 mmol/L (ref 3.5–5.1)
Sodium: 136 mmol/L (ref 135–145)

## 2018-07-24 LAB — TROPONIN I
Troponin I: 0.03 ng/mL (ref <0.03)
Troponin I: 0.03 ng/mL (ref <0.03)
Troponin I: <0.03 ng/mL (ref <0.03)

## 2018-07-24 MED ORDER — PREDNISONE 10 MG PO TABS
10.0000 mg | ORAL_TABLET | Freq: Every day | ORAL | Status: DC
  Administered 2018-07-24 – 2018-07-27 (×4): 10 mg via ORAL
  Filled 2018-07-24 (×5): qty 1

## 2018-07-24 MED ORDER — ALLOPURINOL 100 MG PO TABS
100.0000 mg | ORAL_TABLET | Freq: Every day | ORAL | Status: DC
  Administered 2018-07-24 – 2018-07-26 (×3): 100 mg via ORAL
  Filled 2018-07-24 (×3): qty 1

## 2018-07-24 MED ORDER — SODIUM CHLORIDE 0.9% FLUSH
3.0000 mL | Freq: Two times a day (BID) | INTRAVENOUS | Status: DC
  Administered 2018-07-24 – 2018-07-25 (×2): 3 mL via INTRAVENOUS

## 2018-07-24 MED ORDER — SODIUM CHLORIDE 0.9 % IV SOLN: 250.0000 mL | INTRAVENOUS | Status: DC | PRN

## 2018-07-24 MED ORDER — ISOSORBIDE MONONITRATE ER 30 MG PO TB24
30.0000 mg | ORAL_TABLET | Freq: Every day | ORAL | Status: DC
  Administered 2018-07-24 – 2018-07-26 (×3): 30 mg via ORAL
  Filled 2018-07-24 (×3): qty 1

## 2018-07-24 MED ORDER — ACETAMINOPHEN 325 MG PO TABS
650.0000 mg | ORAL_TABLET | Freq: Four times a day (QID) | ORAL | Status: DC | PRN
  Administered 2018-07-24: 650 mg via ORAL
  Filled 2018-07-24: qty 2

## 2018-07-24 MED ORDER — HYDRALAZINE HCL 20 MG/ML IJ SOLN: 5.0000 mg | INTRAMUSCULAR | Status: DC | PRN

## 2018-07-24 MED ORDER — INFLUENZA VAC SPLIT HIGH-DOSE 0.5 ML IM SUSY
0.5000 mL | PREFILLED_SYRINGE | INTRAMUSCULAR | Status: DC
  Filled 2018-07-24: qty 0.5

## 2018-07-24 MED ORDER — HYDRALAZINE HCL 50 MG PO TABS
100.0000 mg | ORAL_TABLET | Freq: Three times a day (TID) | ORAL | Status: DC
  Administered 2018-07-24: 100 mg via ORAL
  Filled 2018-07-24: qty 2

## 2018-07-24 MED ORDER — SENNOSIDES-DOCUSATE SODIUM 8.6-50 MG PO TABS: 1.0000 | ORAL_TABLET | Freq: Every evening | ORAL | Status: DC | PRN

## 2018-07-24 MED ORDER — HEPARIN (PORCINE) IN NACL 100-0.45 UNIT/ML-% IJ SOLN
1100.0000 [IU]/h | INTRAMUSCULAR | Status: AC
  Administered 2018-07-24 (×2): 1100 [IU]/h via INTRAVENOUS
  Filled 2018-07-24 (×3): qty 250

## 2018-07-24 MED ORDER — HEPARIN BOLUS VIA INFUSION
4000.0000 [IU] | Freq: Once | INTRAVENOUS | Status: AC
  Administered 2018-07-24: 4000 [IU] via INTRAVENOUS
  Filled 2018-07-24: qty 4000

## 2018-07-24 MED ORDER — IOPAMIDOL (ISOVUE-370) INJECTION 76%
INTRAVENOUS | Status: AC
  Filled 2018-07-24: qty 100

## 2018-07-24 MED ORDER — HYDROXYCHLOROQUINE SULFATE 200 MG PO TABS
200.0000 mg | ORAL_TABLET | Freq: Two times a day (BID) | ORAL | Status: DC
  Administered 2018-07-24 – 2018-07-27 (×7): 200 mg via ORAL
  Filled 2018-07-24 (×8): qty 1

## 2018-07-24 MED ORDER — BISACODYL 5 MG PO TBEC: 5.0000 mg | DELAYED_RELEASE_TABLET | Freq: Every day | ORAL | Status: DC | PRN

## 2018-07-24 MED ORDER — ALBUTEROL SULFATE (2.5 MG/3ML) 0.083% IN NEBU: 2.5000 mg | INHALATION_SOLUTION | Freq: Four times a day (QID) | RESPIRATORY_TRACT | Status: DC | PRN

## 2018-07-24 MED ORDER — IOPAMIDOL (ISOVUE-370) INJECTION 76%
80.0000 mL | Freq: Once | INTRAVENOUS | Status: AC | PRN
  Administered 2018-07-24: 100 mL via INTRAVENOUS

## 2018-07-24 MED ORDER — DICLOFENAC SODIUM 1 % TD GEL
1.0000 "application " | Freq: Four times a day (QID) | TRANSDERMAL | Status: DC | PRN
  Administered 2018-07-24 – 2018-07-25 (×4): 1 via TOPICAL
  Filled 2018-07-24: qty 100

## 2018-07-24 MED ORDER — ACETAMINOPHEN 650 MG RE SUPP: 650.0000 mg | Freq: Four times a day (QID) | RECTAL | Status: DC | PRN

## 2018-07-24 MED ORDER — ASPIRIN EC 81 MG PO TBEC
81.0000 mg | DELAYED_RELEASE_TABLET | Freq: Every day | ORAL | Status: DC
  Administered 2018-07-24 – 2018-07-27 (×4): 81 mg via ORAL
  Filled 2018-07-24 (×4): qty 1

## 2018-07-24 MED ORDER — MORPHINE SULFATE (PF) 4 MG/ML IV SOLN: 3.0000 mg | INTRAVENOUS | Status: DC | PRN

## 2018-07-24 MED ORDER — INSULIN ASPART 100 UNIT/ML ~~LOC~~ SOLN
0.0000 [IU] | Freq: Three times a day (TID) | SUBCUTANEOUS | Status: DC
  Administered 2018-07-24: 2 [IU] via SUBCUTANEOUS
  Administered 2018-07-24 (×2): 1 [IU] via SUBCUTANEOUS
  Administered 2018-07-25 – 2018-07-26 (×3): 2 [IU] via SUBCUTANEOUS
  Administered 2018-07-27: 1 [IU] via SUBCUTANEOUS

## 2018-07-24 MED ORDER — ONDANSETRON HCL 4 MG PO TABS: 4.0000 mg | ORAL_TABLET | Freq: Four times a day (QID) | ORAL | Status: DC | PRN

## 2018-07-24 MED ORDER — OLOPATADINE HCL 0.1 % OP SOLN
1.0000 [drp] | Freq: Two times a day (BID) | OPHTHALMIC | Status: DC
  Administered 2018-07-24 – 2018-07-27 (×7): 1 [drp] via OPHTHALMIC
  Filled 2018-07-24: qty 5

## 2018-07-24 MED ORDER — SODIUM CHLORIDE 0.9% FLUSH: 3.0000 mL | INTRAVENOUS | Status: DC | PRN

## 2018-07-24 MED ORDER — LEFLUNOMIDE 20 MG PO TABS
20.0000 mg | ORAL_TABLET | Freq: Every day | ORAL | Status: DC
  Administered 2018-07-24 – 2018-07-27 (×4): 20 mg via ORAL
  Filled 2018-07-24 (×4): qty 1

## 2018-07-24 MED ORDER — SACUBITRIL-VALSARTAN 49-51 MG PO TABS
1.0000 | ORAL_TABLET | Freq: Every day | ORAL | Status: DC
  Administered 2018-07-25 – 2018-07-27 (×3): 1 via ORAL
  Filled 2018-07-24 (×4): qty 1

## 2018-07-24 MED ORDER — GABAPENTIN 100 MG PO CAPS
100.0000 mg | ORAL_CAPSULE | Freq: Every day | ORAL | Status: DC
  Administered 2018-07-24 – 2018-07-26 (×3): 100 mg via ORAL
  Filled 2018-07-24 (×3): qty 1

## 2018-07-24 MED ORDER — HYDROCODONE-ACETAMINOPHEN 5-325 MG PO TABS
1.0000 | ORAL_TABLET | ORAL | Status: DC | PRN
  Administered 2018-07-26 – 2018-07-27 (×2): 1 via ORAL
  Filled 2018-07-24 (×2): qty 1

## 2018-07-24 MED ORDER — INSULIN ASPART 100 UNIT/ML ~~LOC~~ SOLN: 0.0000 [IU] | Freq: Every day | SUBCUTANEOUS | Status: DC

## 2018-07-24 MED ORDER — SODIUM CHLORIDE 0.9% FLUSH
3.0000 mL | Freq: Two times a day (BID) | INTRAVENOUS | Status: DC
  Administered 2018-07-26 – 2018-07-27 (×3): 3 mL via INTRAVENOUS

## 2018-07-24 MED ORDER — ONDANSETRON HCL 4 MG/2ML IJ SOLN: 4.0000 mg | Freq: Four times a day (QID) | INTRAMUSCULAR | Status: DC | PRN

## 2018-07-24 MED ORDER — CALCIUM CARBONATE-VITAMIN D 500-200 MG-UNIT PO TABS
1.0000 | ORAL_TABLET | Freq: Every day | ORAL | Status: DC
  Administered 2018-07-24 – 2018-07-27 (×4): 1 via ORAL
  Filled 2018-07-24 (×4): qty 1

## 2018-07-24 MED ORDER — HYPROMELLOSE (GONIOSCOPIC) 2.5 % OP SOLN: 1.0000 [drp] | Freq: Four times a day (QID) | OPHTHALMIC | Status: DC | PRN

## 2018-07-24 NOTE — Progress Notes (Signed)
bp 83/72, paged Dr Rhona Leavens, will hold entresto

## 2018-07-24 NOTE — Progress Notes (Signed)
ANTICOAGULATION CONSULT NOTE - Follow-up Consult  Pharmacy Consult for Heparin Indication: pulmonary embolus  Allergies  Allergen Reactions  . Clonidine Derivatives Swelling    [name]  . Fish Allergy Diarrhea, Swelling and Other (See Comments)    Turns skin "black," but can tolerate white fish Salmon- Diarrhea  . Shellfish Allergy Hives  . Doxycycline Rash  . Indomethacin Other (See Comments)    Reaction not recalled by the patient  . Lyrica [Pregabalin] Other (See Comments)    Hallucinations   . Methyldopa Other (See Comments)    Aldomet (for hypertension): Reaction not recalled by the patient  . Morphine And Related Other (See Comments)    Family reports it drops her bp that she needs iv fluids  . Orange Fruit [Citrus] Other (See Comments)    Indigestion/heartburn  . Strawberry (Diagnostic) Itching  . Cetirizine Hcl Itching and Rash  . Codeine Itching  . Levaquin [Levofloxacin In D5w] Rash  . Tomato Rash    Patient Measurements: Height: 5\' 4"  (162.6 cm) Weight: 178 lb 9.2 oz (81 kg) IBW/kg (Calculated) : 54.7 Heparin Dosing Weight: 70 kg  Vital Signs: Temp: 97.5 F (36.4 C) (09/15 1618) Temp Source: Oral (09/15 1618) BP: 128/73 (09/15 1618) Pulse Rate: 96 (09/15 1618)  Labs: Recent Labs    07/23/18 2100 07/23/18 2115 07/24/18 0342 07/24/18 0920 07/24/18 0921 07/24/18 1442 07/24/18 1822  HGB 11.7* 12.9  --   --   --   --   --   HCT 37.4 38.0  --   --   --   --   --   PLT 287  --   --   --   --   --   --   HEPARINUNFRC  --   --   --   --  0.53  --  0.54  CREATININE 1.22* 1.20*  --  0.97  --   --   --   TROPONINI 0.03*  --  0.03* 0.03*  --  <0.03  --     Estimated Creatinine Clearance: 42.9 mL/min (by C-G formula based on SCr of 0.97 mg/dL).   Medical History: Past Medical History:  Diagnosis Date  . Allergic rhinitis due to pollen 04/27/2007  . Arthritis    "in q joint"  (08/07/2013)  . CHF (congestive heart failure) (HCC)   . Coronary atherosclerosis of native coronary artery   . Cough   . Disorder of bone and cartilage, unspecified   . Edema 05/03/2013  . Exertional shortness of breath    "sometimes" (08/07/2013)  . First degree atrioventricular block   . Gout, unspecified 04/19/2013  . Hyperlipidemia 04/27/2007  . Hypertension   . Muscle spasm   . Muscle weakness (generalized)   . Myocardial infarction (HCC) ~ 1970  . Onychia and paronychia of toe   . Osteoarthrosis, unspecified whether generalized or localized, unspecified site 04/27/2007  . Other fall   . Other malaise and fatigue   . Pneumonia 05/2018  . Prepatellar bursitis   . Seizures (HCC)   . Stroke (HCC) 01/17/2014  . TIA (transient ischemic attack)    "a few one summer" (08/07/2013)  . Type II diabetes mellitus (HCC)    "fasting 90-110s" (08/07/2013)  . Unspecified essential hypertension 08/08/2013  . Unspecified vitamin D deficiency     Medications:  No current facility-administered medications on file prior to encounter.    Current Outpatient Medications on File Prior to Encounter  Medication Sig Dispense Refill  . albuterol (PROVENTIL HFA;VENTOLIN HFA) 108 (90 Base) MCG/ACT inhaler Inhale 1-2 puffs into the lungs every 6 (six) hours as needed for wheezing or shortness of breath.     . allopurinol (ZYLOPRIM) 100 MG tablet Take 100 mg by mouth at bedtime.     Marland Kitchen aspirin EC 81 MG tablet Take 81 mg by mouth daily.    . calcium-vitamin D (OSCAL WITH D) 500-200 MG-UNIT tablet Take 1 tablet by mouth daily with breakfast.    . carvedilol (COREG) 25 MG tablet Take 1 tablet (25 mg total) by mouth 2 (two) times daily with a meal. 60 tablet 0  . D3-1000 1000 units capsule Take 1,000 Units by mouth daily.  0  . diclofenac sodium (VOLTAREN) 1 % GEL Apply 1 application topically daily. (Patient taking differently: Apply 1 application topically See admin instructions. Apply to painful areas of  shoulders and arms daily as directed) 100 g 4  . ferrous sulfate 325 (65 FE) MG tablet Take 1 tablet (325 mg total) by mouth 2 (two) times daily with a meal. 60 tablet 0  . furosemide (LASIX) 40 MG tablet Take 1 tablet (40 mg total) by mouth daily. 30 tablet 0  . gabapentin (NEURONTIN) 100 MG capsule Take 1 capsule (100 mg total) by mouth at bedtime. 30 capsule 3  . hydrALAZINE (APRESOLINE) 100 MG tablet TAKE 1 TABLET BY MOUTH THREE TIMES DAILY 90 tablet 2  . HYDROcodone-acetaminophen (NORCO/VICODIN) 5-325 MG tablet Take 1 tablet by mouth every 4 (four) hours as needed for moderate pain.    . hydroxychloroquine (PLAQUENIL) 200 MG tablet Take 200 mg by mouth 2 (two) times daily.    . hydroxypropyl methylcellulose / hypromellose (ISOPTO TEARS / GONIOVISC) 2.5 % ophthalmic solution Place 1 drop into both eyes 4 (four) times daily as needed for dry eyes. (Patient taking differently: Place 1 drop into both eyes daily. ) 15 mL 2  . isosorbide mononitrate (IMDUR) 30 MG 24 hr tablet Take 1 tablet (30 mg total) by mouth daily. (Patient taking differently: Take 30 mg by mouth at bedtime. ) 30 tablet 0  . leflunomide (ARAVA) 20 MG tablet Take 20 mg by mouth daily.    . metFORMIN (GLUCOPHAGE) 500 MG tablet Take 1 tablet (500 mg total) by mouth daily with breakfast. 90 tablet 2  . Olopatadine HCl 0.2 % SOLN Apply 1 drop to eye daily. (Patient taking differently: Place 1 drop into both eyes daily. ) 2.5 mL 3  . potassium chloride SA (K-DUR,KLOR-CON) 20 MEQ tablet Take 2 tablets (40 mEq total) by mouth daily. (Patient taking differently: Take 20 mEq by mouth 2 (two) times daily. )    . predniSONE (DELTASONE) 10 MG tablet Take 1 tablet (10 mg total) by mouth daily with breakfast. 5 tablet 0  . sacubitril-valsartan (ENTRESTO) 49-51 MG Take 1 tablet by mouth daily at 12 noon.     Marland Kitchen spironolactone (ALDACTONE) 25 MG tablet Take 12.5 mg by mouth daily.    Marland Kitchen triamcinolone cream (KENALOG) 0.1 % Apply 1 application  topically 2 (two) times daily as needed (for itching- to affected sites).     . vitamin C (ASCORBIC ACID) 500 MG tablet Take 500 mg by mouth daily.      Assessment: 84 y.o. female with PE on heparin. CTA showing acute small PE in RLL.   Confirmatory heparin level came back therapeutic at 0.54, on 1100 units/hr. No s/sx of bleeding. No infusion issues per nursing.  Goal of Therapy:  Heparin level 0.3-0.7 units/ml Monitor platelets by anticoagulation protocol: Yes   Plan:  - Continue heparin at 1100 units/hr - Monitor daily HL, CBC, and for s/sx of bleeding - Follow up with plans for anticoagulation at time of discharge  Girard Cooter, PharmD Clinical Pharmacist  Pager: [phone] Phone: [phone] 07/24/2018     7:35 PM

## 2018-07-24 NOTE — Progress Notes (Signed)
PROGRESS NOTE    [name] [name]  EXN:[id number] DOB: 03-06-1930 DOA: 07/23/2018 PCP: Helane Rima, DO    Brief Narrative:  84 y.o. female with medical history significant for rheumatoid arthritis, chronic combined systolic and diastolic CHF, coronary artery disease, chronic kidney disease stage III, hypertension, type 2 diabetes mellitus, and cholecystectomy 1 month ago, now presenting to the emergency department for evaluation of acute onset of lightheadedness and shoulder pain.  Patient had a brief syncopal episode with hypotension upon arrival to the ED.  She reports that she had been experiencing some increased shoulder pain that she attributes to her rheumatoid arthritis, but no chest pain or shortness of breath.  She was seated and watching TV when she developed acute onset of lightheadedness.  She apparently reported a burning chest discomfort earlier, but denies that now.  She denies any recent fevers or chills and denies cough, chest pain, leg swelling, or leg tenderness.  She denies any history of easy bleeding, melena, or hematochezia.  ED Course: Upon arrival to the ED, patient is found to be saturating adequately on room air, mildly tachypneic, transiently hypotensive to 80/53.  EKG features a sinus rhythm with LVH and chest x-ray is notable for cardiomegaly with improved vascular congestion.  Noncontrast head CT is negative for acute intracranial abnormality.  CTA chest/abdomen/pelvis is notable for acute pulmonary embolism in the right lower lobe.  Chemistry panel features a creatinine 1.22, similar to priors.  CBC is unremarkable, troponin is at the borderline of elevation and BNP slightly elevated.  Patient was given 1.5 L of normal saline and morphine in the ED.  Blood pressure normalized with the IV fluids and patient has remained hemodynamically stable since that time  Assessment & Plan:   Principal Problem:   Acute pulmonary embolism (HCC) Active Problems:   CAD (coronary  artery disease)   Rheumatoid arthritis involving multiple sites (HCC)   Syncope   CKD stage 3 due to type 2 diabetes mellitus (HCC)   DM (diabetes mellitus), type 2 with renal complications (HCC)   Chronic combined systolic and diastolic CHF (congestive heart failure) (HCC)   Hypertension  1. Acute pulmonary embolism  - Presents with acute-onset of lightheadedness and had a brief syncopal episode with hypotension in ED  - ED workup notable for acute PE in RLL  - BP normalized with IVF, presently on minimal O2 support - Troponin minimally elevated, likely secondary to presenting PE - Pt currently on heparin gtt -2d echo and LE dopplers pending -If stable in AM, consider transition to xarelto    2. Syncope  - Likely secondary to acute PE  - Remains on tele -Stable thus far  3. Chronic combined systolic & diastolic CHF  - Appears euvolemic at present  - Of note, patient was given 1.5 liters NS in ED for syncope with hypotension  - Diuretics initially on hold  4. CAD - No anginal complaints  - Non-obstructive CAD on cath in July 2019  - will continue with ASA and nitrates, beta-blocker initially held in light of acute PE with syncope   5. Rheumatoid arthritis  - Patient continued on leflunomide, Plaquenil, and daily prednisone   - Continue as needed APAP and Norco    6. CKD stage III  - SCr is 1.22 on admission, clinically stable at this time - Renally-dose medications   -Repeat bmet in AM  7. Type II DM  - A1c was 5.9% last month  - Managed at home with metformin, held  on admission  - Continue on SSI coverage as tolerated  8. Hypertension  - Held Coreg in light of acute PE with syncope - Hydralazine was resumed, however patient became hypotensive with SBP in the 80's, asymptomatic - Will hold further scheduled hydralazine. Continue on PRN IV hydralazine -  On entresto only if BP can tolerate, hold parameters written  DVT prophylaxis: Heparin subQ Code Status:  Full Family Communication: Pt in room, family at bedside Disposition Plan: Uncertain at this time  Consultants:     Procedures:     Antimicrobials: Anti-infectives (From admission, onward)   Start     Dose/Rate Route Frequency Ordered Stop   07/24/18 1000  hydroxychloroquine (PLAQUENIL) tablet 200 mg     200 mg Oral 2 times daily 07/24/18 0208         Subjective: Without complaints at this time  Objective: Vitals:   07/24/18 0245 07/24/18 0300 07/24/18 0330 07/24/18 1033  BP: (!) 151/83  (!) 144/73 (!) 145/82  Pulse: 86  87 86  Resp: 19  20 19   Temp:  97.9 F (36.6 C) 97.9 F (36.6 C) 98.1 F (36.7 C)  TempSrc:   Oral Oral  SpO2: 90%  96% 96%  Weight:   81 kg   Height:   5\' 4"  (1.626 m)     Intake/Output Summary (Last 24 hours) at 07/24/2018 1336 Last data filed at 07/24/2018 1000 Gross per 24 hour  Intake 1582.53 ml  Output -  Net 1582.53 ml   Filed Weights   07/23/18 2103 07/24/18 0330  Weight: 79 kg 81 kg    Examination:  General exam: Appears calm and comfortable  Respiratory system: Clear to auscultation. Respiratory effort normal. Cardiovascular system: S1 & S2 heard, RRR Gastrointestinal system: Abdomen is nondistended, soft and nontender. No organomegaly or masses felt. Normal bowel sounds heard. Central nervous system: Alert and oriented. No focal neurological deficits. Extremities: Symmetric 5 x 5 power. Skin: No rashes, lesions Psychiatry: Judgement and insight appear normal. Mood & affect appropriate.   Data Reviewed: I have personally reviewed following labs and imaging studies  CBC: Recent Labs  Lab 07/23/18 2100 07/23/18 2115  WBC 9.6  --   NEUTROABS 5.1  --   HGB 11.7* 12.9  HCT 37.4 38.0  MCV 94.9  --   PLT 287  --    Basic Metabolic Panel: Recent Labs  Lab 07/23/18 2100 07/23/18 2115 07/24/18 0920  NA 134* 137 136  K 4.5 4.5 4.0  CL 102 103 103  CO2 21*  --  21*  GLUCOSE 149* 149* 128*  BUN 39* 57* 30*    CREATININE 1.22* 1.20* 0.97  CALCIUM 9.1  --  9.0   GFR: Estimated Creatinine Clearance: 42.9 mL/min (by C-G formula based on SCr of 0.97 mg/dL). Liver Function Tests: Recent Labs  Lab 07/23/18 2100  AST 23  ALT 8  ALKPHOS 61  BILITOT 0.8  PROT 6.1*  ALBUMIN 3.4*   Recent Labs  Lab 07/23/18 2100  LIPASE 50   No results for input(s): AMMONIA in the last 168 hours. Coagulation Profile: No results for input(s): INR, PROTIME in the last 168 hours. Cardiac Enzymes: Recent Labs  Lab 07/23/18 2100 07/24/18 0342 07/24/18 0920  TROPONINI 0.03* 0.03* 0.03*   BNP (last 3 results) No results for input(s): PROBNP in the last 8760 hours. HbA1C: No results for input(s): HGBA1C in the last 72 hours. CBG: Recent Labs  Lab 07/24/18 0342 07/24/18 0742 07/24/18 1229  GLUCAP  125* 130* 150*   Lipid Profile: No results for input(s): CHOL, HDL, LDLCALC, TRIG, CHOLHDL, LDLDIRECT in the last 72 hours. Thyroid Function Tests: No results for input(s): TSH, T4TOTAL, FREET4, T3FREE, THYROIDAB in the last 72 hours. Anemia Panel: No results for input(s): VITAMINB12, FOLATE, FERRITIN, TIBC, IRON, RETICCTPCT in the last 72 hours. Sepsis Labs: Recent Labs  Lab 07/23/18 2115  LATICACIDVEN 1.72    No results found for this or any previous visit (from the past 240 hour(s)).   Radiology Studies: Ct Head Wo Contrast  Result Date: 07/23/2018 CLINICAL DATA:  Patient with generalized weakness. EXAM: CT HEAD WITHOUT CONTRAST TECHNIQUE: Contiguous axial images were obtained from the base of the skull through the vertex without intravenous contrast. COMPARISON:  Brain CT 06/24/2018. FINDINGS: Brain: Ventricles and sulci are appropriate for patient's age. Periventricular and subcortical white matter hypodensity compatible with chronic microvascular ischemic changes. No evidence for acute cortically based infarct, intracranial hemorrhage, mass lesion or mass-effect. Vascular: Unremarkable Skull:  Intact. Sinuses/Orbits: Unremarkable orbits. Paranasal sinuses are well aerated. Mastoid air cells are unremarkable. Other: None. IMPRESSION: No acute intracranial process. Electronically Signed   By: Annia Belt [name]   On: 07/23/2018 22:23   Dg Chest Port 1 View  Result Date: 07/23/2018 CLINICAL DATA:  Weakness and diaphoresis EXAM: PORTABLE CHEST 1 VIEW COMPARISON:  Chest radiograph 06/22/2018 FINDINGS: Cardiomegaly is unchanged. Improved vascular congestion with mild residual. No focal airspace disease, large pleural effusion, or pneumothorax. Chronic deformity of both shoulders. IMPRESSION: Cardiomegaly. Improved vascular congestion from exam last month with mild residual. Electronically Signed   By: Narda Rutherford [name]   On: 07/23/2018 21:40   Ct Angio Chest/abd/pel For Dissection W And/or Wo Contrast  Result Date: 07/24/2018 CLINICAL DATA:  84 year old with recurrent syncope. EXAM: CT ANGIOGRAPHY CHEST, ABDOMEN AND PELVIS TECHNIQUE: Multidetector CT imaging through the chest, abdomen and pelvis was performed using the standard protocol during bolus administration of intravenous contrast. Multiplanar reconstructed images and MIPs were obtained and reviewed to evaluate the vascular anatomy. CONTRAST:  ISOVUE-370 IOPAMIDOL (ISOVUE-370) INJECTION 76% COMPARISON:  Radiograph yesterday. Chest CT 06/15/2018. Chest abdomen pelvis CT 09/11/2015 FINDINGS: CTA CHEST FINDINGS Cardiovascular: Aortic tortuosity and atherosclerosis without dissection, acute aortic syndrome, aortic hematoma or aneurysm. Common origin of the brachiocephalic and left common carotid artery, normal variant arch configuration. Small volume thrombus in the right lower lobe pulmonary artery extending into segmental branch consistent with acute pulmonary embolus, new from prior exam. Multi chamber cardiomegaly is unchanged from prior exam. No evidence of right heart strain. Small pericardial effusion with fluid adjacent to the right  ventricle. Mediastinum/Nodes: No enlarged mediastinal or hilar lymph nodes. Heterogeneous thyroid gland with subcentimeter nodule in the left lobe. The esophagus is nondistended. Lungs/Pleura: Prior nodular opacity in the right upper lobe has resolved since prior exam, was likely infectious or inflammatory. Subpleural nodule in the right middle lobe image 54 series 8 is unchanged. Hypoventilatory change at the lung bases. No confluent airspace disease, pleural effusion or pulmonary edema. Musculoskeletal: Chronic osseous remottling of the shoulders, bilateral shoulder joint effusions. Multilevel degenerative change throughout the spine. Prominent Schmorl's node superior endplate of T4. Review of the MIP images confirms the above findings. CTA ABDOMEN AND PELVIS FINDINGS VASCULAR Aorta: Moderate atherosclerosis without dissection, evidence of vasculitis, aneurysm or significant stenosis. Celiac: Mild atherosclerosis at the origin without stenosis or dissection. SMA: Mild plaque at the origin without significant stenosis or dissection. Mesenteric branches are patent. Renals: Patent without dissection or significant stenosis. IMA:  Patent without dissection or stenosis. Inflow: Mild atherosclerotic calcification without dissection or stenosis. Veins: No obvious venous abnormality within the limitations of this arterial phase study. Review of the MIP images confirms the above findings. NON-VASCULAR Hepatobiliary: No focal lesion on arterial phase exam. Common bile duct dilatation with CBD measuring 14 mm at the porta hepatis, unchanged from MRCP last month. Interval cholecystectomy. No fluid collection in the gallbladder fossa. Pancreas: No ductal dilatation or inflammation. Spleen: Normal in size without focal abnormality. Adrenals/Urinary Tract: No adrenal nodule. No hydronephrosis. Mild symmetric perinephric edema appears chronic. Simple cyst in the right kidney. Urinary bladder is distended, no bladder wall  thickening. Stomach/Bowel: Colonic diverticulosis without diverticulitis. Moderate stool burden throughout the colon. No small bowel dilatation, inflammation, or obstruction. Stomach distended with ingested contents. Lymphatic: No abdominopelvic adenopathy. Reproductive: Status post hysterectomy. No adnexal masses. Other: No free air free fluid. Musculoskeletal: Chronic L1 compression fracture, upper most non-rib-bearing lumbar vertebra. Multilevel degenerative change in the lumbar spine. Anterolisthesis of L4 on L5 (numbering from superior most non-rib-bearing lumbar vertebra) is likely facet mediated. Peripherally sclerotic lucent lesion in the left proximal femur stable dating back to 09/11/2015 abdominal CT and considered benign. No acute osseous abnormalities. Review of the MIP images confirms the above findings. IMPRESSION: 1. Acute small volume pulmonary embolus in the right lower lobe, new from CT last month. 2. Diffuse thoracoabdominal aortic atherosclerosis without dissection or acute aortic abnormality. Chronic cardiomegaly. 3. Prior right upper lobe nodular opacity on chest CT has resolved, was likely infectious or inflammatory. 4. No acute findings in the abdomen or pelvis. Colonic diverticulosis without diverticulitis. 5. Recent cholecystectomy without complication. Biliary dilatation is chronic. Aortic Atherosclerosis (ICD10-I70.0). These results were called by telephone at the time of interpretation on 07/24/2018 at 1:18 am to [facility] , who verbally acknowledged these results. Electronically Signed   By: Narda Rutherford [name]   On: 07/24/2018 01:19    Scheduled Meds: . allopurinol  100 mg Oral QHS  . aspirin EC  81 mg Oral Daily  . calcium-vitamin D  1 tablet Oral Q breakfast  . gabapentin  100 mg Oral QHS  . hydrALAZINE  100 mg Oral TID  . hydroxychloroquine  200 mg Oral BID  . [START ON 07/25/2018] Influenza vac split quadrivalent PF  0.5 mL Intramuscular Tomorrow-1000  . insulin  aspart  0-5 Units Subcutaneous QHS  . insulin aspart  0-9 Units Subcutaneous TID WC  . isosorbide mononitrate  30 mg Oral QHS  . leflunomide  20 mg Oral Daily  . olopatadine  1 drop Both Eyes BID  . predniSONE  10 mg Oral Q breakfast  . sacubitril-valsartan  1 tablet Oral Q1200  . sodium chloride flush  3 mL Intravenous Q12H  . sodium chloride flush  3 mL Intravenous Q12H   Continuous Infusions: . sodium chloride    . heparin 1,100 Units/hr (07/24/18 0500)     LOS: 0 days   Rickey Barbara, MD [facility] Pager On Amion  If 7PM-7AM, please contact night-coverage 07/24/2018, 1:36 PM

## 2018-07-24 NOTE — ED Provider Notes (Addendum)
[facility] EMERGENCY DEPARTMENT Provider Note   CSN: [id number] Arrival date & time: 07/23/18  2046     History   Chief Complaint Chief Complaint  Patient presents with  . Chest Pain  . Weakness    HPI [name] [name] is a 84 y.o. female.  The history is provided by the patient and a relative. No language interpreter was used.  Chest Pain   This is a new problem. The current episode started 3 to 5 hours ago. The problem occurs constantly. The problem has not changed since onset.The pain is present in the lateral region. The pain is severe. The quality of the pain is described as brief. Associated symptoms include weakness. She has tried nothing for the symptoms. The treatment provided no relief. There are no known risk factors.  Her past medical history is significant for hyperlipidemia.  Weakness  Associated symptoms include chest pain.  EMS reports pt complained of pain in her right arm and then passed out.  Pt had gallbladder surgery 4 weeks ago.   RN reports pt became sweaty and stopped responding for 2-3 minutes here.    Pt tell me her right arm hurts.  Pt recalls arm pain.  She denies any chest pain, no abdominal pain   Past Medical History:  Diagnosis Date  . Allergic rhinitis due to pollen 04/27/2007  . Arthritis    "in q joint" (08/07/2013)  . CHF (congestive heart failure) (HCC)   . Coronary atherosclerosis of native coronary artery   . Cough   . Disorder of bone and cartilage, unspecified   . Edema 05/03/2013  . Exertional shortness of breath    "sometimes" (08/07/2013)  . First degree atrioventricular block   . Gout, unspecified 04/19/2013  . Hyperlipidemia 04/27/2007  . Hypertension   . Muscle spasm   . Muscle weakness (generalized)   . Myocardial infarction (HCC) ~ 1970  . Onychia and paronychia of toe   . Osteoarthrosis, unspecified whether generalized or localized, unspecified site 04/27/2007  . Other fall   . Other malaise and  fatigue   . Pneumonia 05/2018  . Prepatellar bursitis   . Seizures (HCC)   . Stroke (HCC) 01/17/2014  . TIA (transient ischemic attack)    "a few one summer" (08/07/2013)  . Type II diabetes mellitus (HCC)    "fasting 90-110s" (08/07/2013)  . Unspecified essential hypertension 08/08/2013  . Unspecified vitamin D deficiency     Patient Active Problem List   Diagnosis Date Noted  . Acute on chronic congestive heart failure (HCC) 07/03/2018  . Cerebral thrombosis with cerebral infarction 06/24/2018  . Abdominal pain 06/23/2018  . Cholelithiasis 06/23/2018  . Elevated LFTs 06/23/2018  . Acute pulmonary edema (HCC)   . Cholecystitis   . Hypertension 06/18/2018  . Acute metabolic encephalopathy 06/18/2018  . Acute kidney injury superimposed on chronic kidney disease (HCC) 06/18/2018  . Pneumonia of left upper lobe due to infectious organism (HCC)   . Elevated troponin   . DCM (dilated cardiomyopathy) (HCC)   . Benign hypertensive heart and kidney disease with CHF and stage 3 chronic kidney disease (HCC) 05/28/2018  . Osteoarthritis 05/06/2018  . Adhesive capsulitis of right shoulder 10/12/2017  . Idiopathic chronic gout of multiple sites with tophus 08/25/2017  . Rheumatic nodule 06/28/2017  . History of total knee arthroplasty, bilateral 02/18/2017  . History of rotator cuff surgery 02/18/2017  . Obesity (BMI 30.0-34.9) 12/08/2016  . High risk medications (not anticoagulants) long-term use 09/21/2016  .  Hammer toe 10/31/2015  . Acute combined systolic and diastolic heart failure (HCC) 09/12/2015  . Midline low back pain without sciatica 06/27/2014  . Bilateral edema of lower extremity 06/05/2014  . CKD stage 3 due to type 2 diabetes mellitus (HCC) 06/05/2014  . DM (diabetes mellitus), type 2 with renal complications (HCC) 06/05/2014    Class: Chronic  . Anemia of chronic disease 05/01/2014  . Fluctuating blood pressure 02/06/2014  . Stroke (HCC) 01/17/2014  . Rheumatoid  arthritis involving multiple sites (HCC) 12/19/2013    Class: Chronic  . CAD (coronary artery disease) 10/18/2013  . Acute blood loss as cause of postoperative anemia 08/09/2013  . Fibromyalgia 05/10/2013  . Reactive airway disease 04/19/2013  . Gout 04/19/2013  . Hyperlipidemia 04/27/2007  . Allergic rhinitis 04/27/2007  . Diverticulosis 04/27/2007    Past Surgical History:  Procedure Laterality Date  . ABDOMINAL HYSTERECTOMY  04/1980  . APPENDECTOMY  1960  . CARDIAC CATHETERIZATION  2003  . CATARACT EXTRACTION W/ INTRAOCULAR LENS  IMPLANT, BILATERAL Bilateral ~ 2012  . CHOLECYSTECTOMY N/A 06/26/2018   Procedure: LAPAROSCOPIC CHOLECYSTECTOMY POSSIBLE INTRAOPERATIVE CHOLANGIOGRAM;  Surgeon: Abigail Miyamoto, MD;  Location: [facility] OR;  Service: General;  Laterality: N/A;  . JOINT REPLACEMENT    . REPLACEMENT TOTAL KNEE Right 09/2005  . RIGHT/LEFT HEART CATH AND CORONARY ANGIOGRAPHY N/A 06/01/2018   Procedure: RIGHT/LEFT HEART CATH AND CORONARY ANGIOGRAPHY;  Surgeon: Corky Crafts, MD;  Location: [facility] INVASIVE CV LAB;  Service: Cardiovascular;  Laterality: N/A;  . SHOULDER OPEN ROTATOR CUFF REPAIR Right 07/1999  . TONSILLECTOMY  08/1974  . TOTAL KNEE ARTHROPLASTY Left 08/07/2013   Procedure: TOTAL KNEE ARTHROPLASTY- LEFT;  Surgeon: Dannielle Huh, MD;  Location: [facility] OR;  Service: Orthopedics;  Laterality: Left;  . TRANSTHORACIC ECHOCARDIOGRAM  2003   EF 55-65%; mild concentric LVH     OB History   None      Home Medications    Prior to Admission medications   Medication Sig Start Date End Date Taking? Authorizing Provider  albuterol (PROVENTIL HFA;VENTOLIN HFA) 108 (90 Base) MCG/ACT inhaler Inhale 1-2 puffs into the lungs every 6 (six) hours as needed for wheezing or shortness of breath.    Yes [provider]  allopurinol (ZYLOPRIM) 100 MG tablet Take 100 mg by mouth at bedtime.    Yes [provider]  aspirin EC 81 MG tablet Take 81 mg by mouth daily.   Yes  [provider]  calcium-vitamin D (OSCAL WITH D) 500-200 MG-UNIT tablet Take 1 tablet by mouth daily with breakfast.   Yes [provider]  carvedilol (COREG) 25 MG tablet Take 1 tablet (25 mg total) by mouth 2 (two) times daily with a meal. 06/04/18  Yes Berton Mount I, MD  D3-1000 1000 units capsule Take 1,000 Units by mouth daily. 06/07/18  Yes [provider]  diclofenac sodium (VOLTAREN) 1 % GEL Apply 1 application topically daily. Patient taking differently: Apply 1 application topically See admin instructions. Apply to painful areas of shoulders and arms daily as directed 02/22/18  Yes Helane Rima, DO  ferrous sulfate 325 (65 FE) MG tablet Take 1 tablet (325 mg total) by mouth 2 (two) times daily with a meal. 06/04/18  Yes Barnetta Chapel, MD  furosemide (LASIX) 40 MG tablet Take 1 tablet (40 mg total) by mouth daily. 06/28/18  Yes Rhetta Mura, MD  gabapentin (NEURONTIN) 100 MG capsule Take 1 capsule (100 mg total) by mouth at bedtime. 05/19/18  Yes Helane Rima,  DO  hydrALAZINE (APRESOLINE) 100 MG tablet TAKE 1 TABLET BY MOUTH THREE TIMES DAILY 04/05/18  Yes Helane Rima, DO  HYDROcodone-acetaminophen (NORCO/VICODIN) 5-325 MG tablet Take 1 tablet by mouth every 4 (four) hours as needed for moderate pain.   Yes [provider]  hydroxychloroquine (PLAQUENIL) 200 MG tablet Take 200 mg by mouth 2 (two) times daily.   Yes [provider]  hydroxypropyl methylcellulose / hypromellose (ISOPTO TEARS / GONIOVISC) 2.5 % ophthalmic solution Place 1 drop into both eyes 4 (four) times daily as needed for dry eyes. Patient taking differently: Place 1 drop into both eyes daily.  07/07/17  Yes Helane Rima, DO  isosorbide mononitrate (IMDUR) 30 MG 24 hr tablet Take 1 tablet (30 mg total) by mouth daily. Patient taking differently: Take 30 mg by mouth at bedtime.  06/05/18  Yes Barnetta Chapel, MD  leflunomide (ARAVA) 20 MG tablet Take 20 mg  by mouth daily.   Yes [provider]  metFORMIN (GLUCOPHAGE) 500 MG tablet Take 1 tablet (500 mg total) by mouth daily with breakfast. 01/28/18  Yes Helane Rima, DO  Olopatadine HCl 0.2 % SOLN Apply 1 drop to eye daily. Patient taking differently: Place 1 drop into both eyes daily.  02/22/18  Yes Helane Rima, DO  potassium chloride SA (K-DUR,KLOR-CON) 20 MEQ tablet Take 2 tablets (40 mEq total) by mouth daily. Patient taking differently: Take 20 mEq by mouth 2 (two) times daily.  06/28/18  Yes Rhetta Mura, MD  predniSONE (DELTASONE) 10 MG tablet Take 1 tablet (10 mg total) by mouth daily with breakfast. 03/25/18  Yes Helane Rima, DO  sacubitril-valsartan (ENTRESTO) 49-51 MG Take 1 tablet by mouth daily at 12 noon.    Yes [provider]  spironolactone (ALDACTONE) 25 MG tablet Take 12.5 mg by mouth daily.   Yes [provider]  triamcinolone cream (KENALOG) 0.1 % Apply 1 application topically 2 (two) times daily as needed (for itching- to affected sites).    Yes [provider]  vitamin C (ASCORBIC ACID) 500 MG tablet Take 500 mg by mouth daily.   Yes [provider]    Family History Family History  Problem Relation Age of Onset  . Heart attack Father     Social History Social History   Tobacco Use  . Smoking status: Never Smoker  . Smokeless tobacco: Never Used  Substance Use Topics  . Alcohol use: No    Alcohol/week: 0.0 standard drinks  . Drug use: No     Allergies   Clonidine derivatives; Fish allergy; Shellfish allergy; Doxycycline; Indomethacin; Lyrica [pregabalin]; Methyldopa; Orange fruit [citrus]; Strawberry (diagnostic); Cetirizine hcl; Codeine; Levaquin [levofloxacin in d5w]; and Tomato   Review of Systems Review of Systems  Cardiovascular: Positive for chest pain.  Neurological: Positive for weakness.  All other systems reviewed and are negative.    Physical Exam Updated Vital Signs BP (!) 149/82    Pulse 85   Resp 20   Wt 79 kg   SpO2 96%   BMI 29.90 kg/m   Physical Exam  Constitutional: She is oriented to person, place, and time. She appears well-developed and well-nourished.  HENT:  Head: Normocephalic.  Eyes: Pupils are equal, round, and reactive to light. EOM are normal.  Neck: Normal range of motion.  Cardiovascular: Normal rate, regular rhythm and normal pulses.  Pulmonary/Chest: Effort normal.  Abdominal: Soft. She exhibits no distension. There is no tenderness. There is no guarding.  Musculoskeletal: Normal range of motion.  Right lower leg: Normal.  Neurological: She is alert and oriented to person, place, and time.  Skin: Skin is warm.  Psychiatric: She has a normal mood and affect.  Nursing note and vitals reviewed.    ED Treatments / Results  Labs (all labs ordered are listed, but only abnormal results are displayed) Labs Reviewed  CBC WITH DIFFERENTIAL/PLATELET - Abnormal; Notable for the following components:      Result Value   Hemoglobin 11.7 (*)    RDW 20.3 (*)    All other components within normal limits  COMPREHENSIVE METABOLIC PANEL - Abnormal; Notable for the following components:   Sodium 134 (*)    CO2 21 (*)    Glucose, Bld 149 (*)    BUN 39 (*)    Creatinine, Ser 1.22 (*)    Total Protein 6.1 (*)    Albumin 3.4 (*)    GFR calc non Af Amer 39 (*)    GFR calc Af Amer 45 (*)    All other components within normal limits  BRAIN NATRIURETIC PEPTIDE - Abnormal; Notable for the following components:   B Natriuretic Peptide 138.3 (*)    All other components within normal limits  TROPONIN I - Abnormal; Notable for the following components:   Troponin I 0.03 (*)    All other components within normal limits  I-STAT CHEM 8, ED - Abnormal; Notable for the following components:   BUN 57 (*)    Creatinine, Ser 1.20 (*)    Glucose, Bld 149 (*)    Calcium, Ion 1.13 (*)    All other components within normal limits  LIPASE, BLOOD  URINALYSIS,  ROUTINE W REFLEX MICROSCOPIC  I-STAT TROPONIN, ED  I-STAT CG4 LACTIC ACID, ED  I-STAT CG4 LACTIC ACID, ED    EKG EKG Interpretation  Date/Time:  Saturday July 23 2018 20:52:01 EDT Ventricular Rate:  83 PR Interval:    QRS Duration: 93 QT Interval:  411 QTC Calculation: 483 R Axis:   -27 Text Interpretation:  Sinus rhythm Consider left atrial enlargement Left ventricular hypertrophy Inferior infarct, old No significant change since last tracing Confirmed by Melene Plan [id number]) on 07/23/2018 9:00:32 PM   Radiology Ct Head Wo Contrast  Result Date: 07/23/2018 CLINICAL DATA:  Patient with generalized weakness. EXAM: CT HEAD WITHOUT CONTRAST TECHNIQUE: Contiguous axial images were obtained from the base of the skull through the vertex without intravenous contrast. COMPARISON:  Brain CT 06/24/2018. FINDINGS: Brain: Ventricles and sulci are appropriate for patient's age. Periventricular and subcortical white matter hypodensity compatible with chronic microvascular ischemic changes. No evidence for acute cortically based infarct, intracranial hemorrhage, mass lesion or mass-effect. Vascular: Unremarkable Skull: Intact. Sinuses/Orbits: Unremarkable orbits. Paranasal sinuses are well aerated. Mastoid air cells are unremarkable. Other: None. IMPRESSION: No acute intracranial process. Electronically Signed   By: [name] Belt [name]   On: 07/23/2018 22:23   Dg Chest Port 1 View  Result Date: 07/23/2018 CLINICAL DATA:  Weakness and diaphoresis EXAM: PORTABLE CHEST 1 VIEW COMPARISON:  Chest radiograph 06/22/2018 FINDINGS: Cardiomegaly is unchanged. Improved vascular congestion with mild residual. No focal airspace disease, large pleural effusion, or pneumothorax. Chronic deformity of both shoulders. IMPRESSION: Cardiomegaly. Improved vascular congestion from exam last month with mild residual. Electronically Signed   By: Narda Rutherford [name]   On: 07/23/2018 21:40    Procedures .Critical  Care Performed by: Elson Areas, PA-C Authorized by: Elson Areas, PA-C   Critical care provider statement:    Critical care time (  minutes):  45   Critical care start time:  07/23/2018 10:00 PM   Critical care end time:  07/24/2018 1:25 AM   Critical care was necessary to treat or prevent imminent or life-threatening deterioration of the following conditions:  Circulatory failure   Critical care was time spent personally by me on the following activities:  Discussions with consultants, evaluation of patient's response to treatment, examination of patient, interpretation of cardiac output measurements, obtaining history from patient or surrogate, ordering and performing treatments and interventions, pulse oximetry, re-evaluation of patient's condition and review of old charts   (including critical care time)  Medications Ordered in ED Medications  iopamidol (ISOVUE-370) 76 % injection (has no administration in time range)  sodium chloride 0.9 % bolus 1,000 mL (1,000 mLs Intravenous New Bag/Given 07/23/18 2251)  ondansetron (ZOFRAN) injection 4 mg (4 mg Intravenous Given 07/23/18 2115)  morphine 2 MG/ML injection 2 mg (2 mg Intravenous Given 07/23/18 2116)  sodium chloride 0.9 % bolus 500 mL (0 mLs Intravenous Stopped 07/23/18 2224)  iopamidol (ISOVUE-370) 76 % injection 80 mL (100 mLs Intravenous Contrast Given 07/24/18 0034)     Initial Impression / Assessment and Plan / ED Course  I have reviewed the triage vital signs and the nursing notes.  Pertinent labs & imaging results that were available during my care of the patient were reviewed by me and considered in my medical decision making (see chart for details).  Clinical Course as of Jul 25 123  Wynelle Link Jul 24, 2018  0123 Comprehensive metabolic panel(!) [LS]    Clinical Course User Index [LS] Elson Areas, PA-C   MDM  Pt sweaty but awake at the time of my evaluation.  Pt denies any weakness.  Pt denies any neurologic changes.   Dr. Adela Lank in to see and examine pt.  Pt awake and able to give history.   Labs reviewed.   Ct angio chest and abdomen.   Radiologist reports pt has right sided PE.   Final Clinical Impressions(s) / ED Diagnoses   Final diagnoses:  Syncope, unspecified syncope type  Acute pulmonary embolism without acute cor pulmonale, unspecified pulmonary embolism type (HCC)  Syncope and collapse    ED Discharge Orders    None     Consult to hospitalist for admission   Osie Cheeks 07/24/18 0122    Elson Areas, PA-C 07/24/18 0126    Elson Areas, PA-C 07/24/18 0145    Melene Plan, DO 07/25/18 0012

## 2018-07-24 NOTE — Progress Notes (Signed)
ANTICOAGULATION CONSULT NOTE - Follow-up Consult  Pharmacy Consult for Heparin Indication: pulmonary embolus  Allergies  Allergen Reactions  . Clonidine Derivatives Swelling    [name]  . Fish Allergy Diarrhea, Swelling and Other (See Comments)    Turns skin "black," but can tolerate white fish Salmon- Diarrhea  . Shellfish Allergy Hives  . Doxycycline Rash  . Indomethacin Other (See Comments)    Reaction not recalled by the patient  . Lyrica [Pregabalin] Other (See Comments)    Hallucinations   . Methyldopa Other (See Comments)    Aldomet (for hypertension): Reaction not recalled by the patient  . Morphine And Related Other (See Comments)    Family reports it drops her bp that she needs iv fluids  . Orange Fruit [Citrus] Other (See Comments)    Indigestion/heartburn  . Strawberry (Diagnostic) Itching  . Cetirizine Hcl Itching and Rash  . Codeine Itching  . Levaquin [Levofloxacin In D5w] Rash  . Tomato Rash    Patient Measurements: Height: 5\' 4"  (162.6 cm) Weight: 178 lb 9.2 oz (81 kg) IBW/kg (Calculated) : 54.7 Heparin Dosing Weight: 70 kg  Vital Signs: Temp: 98.1 F (36.7 C) (09/15 1033) Temp Source: Oral (09/15 1033) BP: 145/82 (09/15 1033) Pulse Rate: 86 (09/15 1033)  Labs: Recent Labs    07/23/18 2100 07/23/18 2115 07/24/18 0342 07/24/18 0920 07/24/18 0921  HGB 11.7* 12.9  --   --   --   HCT 37.4 38.0  --   --   --   PLT 287  --   --   --   --   HEPARINUNFRC  --   --   --   --  0.53  CREATININE 1.22* 1.20*  --  0.97  --   TROPONINI 0.03*  --  0.03* 0.03*  --     Estimated Creatinine Clearance: 42.9 mL/min (by C-G formula based on SCr of 0.97 mg/dL).   Medical History: Past Medical History:  Diagnosis Date  . Allergic rhinitis due to pollen 04/27/2007  . Arthritis    "in q joint" (08/07/2013)  . CHF (congestive heart failure) (HCC)   . Coronary atherosclerosis of native  coronary artery   . Cough   . Disorder of bone and cartilage, unspecified   . Edema 05/03/2013  . Exertional shortness of breath    "sometimes" (08/07/2013)  . First degree atrioventricular block   . Gout, unspecified 04/19/2013  . Hyperlipidemia 04/27/2007  . Hypertension   . Muscle spasm   . Muscle weakness (generalized)   . Myocardial infarction (HCC) ~ 1970  . Onychia and paronychia of toe   . Osteoarthrosis, unspecified whether generalized or localized, unspecified site 04/27/2007  . Other fall   . Other malaise and fatigue   . Pneumonia 05/2018  . Prepatellar bursitis   . Seizures (HCC)   . Stroke (HCC) 01/17/2014  . TIA (transient ischemic attack)    "a few one summer" (08/07/2013)  . Type II diabetes mellitus (HCC)    "fasting 90-110s" (08/07/2013)  . Unspecified essential hypertension 08/08/2013  . Unspecified vitamin D deficiency     Medications:  No current facility-administered medications on file prior to encounter.    Current Outpatient Medications on File Prior to Encounter  Medication Sig Dispense Refill  . albuterol (PROVENTIL HFA;VENTOLIN HFA) 108 (90 Base) MCG/ACT inhaler Inhale 1-2 puffs into the lungs every 6 (six) hours as needed for wheezing or shortness of breath.     08/10/2013  allopurinol (ZYLOPRIM) 100 MG tablet Take 100 mg by mouth at bedtime.     Marland Kitchen aspirin EC 81 MG tablet Take 81 mg by mouth daily.    . calcium-vitamin D (OSCAL WITH D) 500-200 MG-UNIT tablet Take 1 tablet by mouth daily with breakfast.    . carvedilol (COREG) 25 MG tablet Take 1 tablet (25 mg total) by mouth 2 (two) times daily with a meal. 60 tablet 0  . D3-1000 1000 units capsule Take 1,000 Units by mouth daily.  0  . diclofenac sodium (VOLTAREN) 1 % GEL Apply 1 application topically daily. (Patient taking differently: Apply 1 application topically See admin instructions. Apply to painful areas of shoulders and arms daily as directed) 100 g 4  . ferrous sulfate 325 (65 FE) MG tablet Take 1  tablet (325 mg total) by mouth 2 (two) times daily with a meal. 60 tablet 0  . furosemide (LASIX) 40 MG tablet Take 1 tablet (40 mg total) by mouth daily. 30 tablet 0  . gabapentin (NEURONTIN) 100 MG capsule Take 1 capsule (100 mg total) by mouth at bedtime. 30 capsule 3  . hydrALAZINE (APRESOLINE) 100 MG tablet TAKE 1 TABLET BY MOUTH THREE TIMES DAILY 90 tablet 2  . HYDROcodone-acetaminophen (NORCO/VICODIN) 5-325 MG tablet Take 1 tablet by mouth every 4 (four) hours as needed for moderate pain.    . hydroxychloroquine (PLAQUENIL) 200 MG tablet Take 200 mg by mouth 2 (two) times daily.    . hydroxypropyl methylcellulose / hypromellose (ISOPTO TEARS / GONIOVISC) 2.5 % ophthalmic solution Place 1 drop into both eyes 4 (four) times daily as needed for dry eyes. (Patient taking differently: Place 1 drop into both eyes daily. ) 15 mL 2  . isosorbide mononitrate (IMDUR) 30 MG 24 hr tablet Take 1 tablet (30 mg total) by mouth daily. (Patient taking differently: Take 30 mg by mouth at bedtime. ) 30 tablet 0  . leflunomide (ARAVA) 20 MG tablet Take 20 mg by mouth daily.    . metFORMIN (GLUCOPHAGE) 500 MG tablet Take 1 tablet (500 mg total) by mouth daily with breakfast. 90 tablet 2  . Olopatadine HCl 0.2 % SOLN Apply 1 drop to eye daily. (Patient taking differently: Place 1 drop into both eyes daily. ) 2.5 mL 3  . potassium chloride SA (K-DUR,KLOR-CON) 20 MEQ tablet Take 2 tablets (40 mEq total) by mouth daily. (Patient taking differently: Take 20 mEq by mouth 2 (two) times daily. )    . predniSONE (DELTASONE) 10 MG tablet Take 1 tablet (10 mg total) by mouth daily with breakfast. 5 tablet 0  . sacubitril-valsartan (ENTRESTO) 49-51 MG Take 1 tablet by mouth daily at 12 noon.     Marland Kitchen spironolactone (ALDACTONE) 25 MG tablet Take 12.5 mg by mouth daily.    Marland Kitchen triamcinolone cream (KENALOG) 0.1 % Apply 1 application topically 2 (two) times daily as needed (for itching- to affected sites).     . vitamin C (ASCORBIC  ACID) 500 MG tablet Take 500 mg by mouth daily.      Assessment: 84 y.o. female with PE on heparin.  Initial 8 hour heparin level is therapeutic at 0.53. No s/sx of bleeding noted and last CBC was normal.  Goal of Therapy:  Heparin level 0.3-0.7 units/ml Monitor platelets by anticoagulation protocol: Yes   Plan:  - Continue heparin at 1100 units/hr - Check confirmatory heparin level in 8 hours. - Monitor for s/sx of bleeding.  Arvilla Market, PharmD PGY1 Pharmacy Resident Phone [phone])  [phone] 07/24/2018     11:36 AM

## 2018-07-24 NOTE — H&P (Signed)
History and Physical    [name] [name] FHL:[id number] DOB: 16-Oct-1930 DOA: 07/23/2018  PCP: Helane Rima, DO   Patient coming from: Home   Chief Complaint: Acute light-headedness, shoulder pain  HPI: [name] [name] is a 84 y.o. female with medical history significant for rheumatoid arthritis, chronic combined systolic and diastolic CHF, coronary artery disease, chronic kidney disease stage III, hypertension, type 2 diabetes mellitus, and cholecystectomy 1 month ago, now presenting to the emergency department for evaluation of acute onset of lightheadedness and shoulder pain.  Patient had a brief syncopal episode with hypotension upon arrival to the ED.  She reports that she had been experiencing some increased shoulder pain that she attributes to her rheumatoid arthritis, but no chest pain or shortness of breath.  She was seated and watching TV when she developed acute onset of lightheadedness.  She apparently reported a burning chest discomfort earlier, but denies that now.  She denies any recent fevers or chills and denies cough, chest pain, leg swelling, or leg tenderness.  She denies any history of easy bleeding, melena, or hematochezia.  ED Course: Upon arrival to the ED, patient is found to be saturating adequately on room air, mildly tachypneic, transiently hypotensive to 80/53.  EKG features a sinus rhythm with LVH and chest x-ray is notable for cardiomegaly with improved vascular congestion.  Noncontrast head CT is negative for acute intracranial abnormality.  CTA chest/abdomen/pelvis is notable for acute pulmonary embolism in the right lower lobe.  Chemistry panel features a creatinine 1.22, similar to priors.  CBC is unremarkable, troponin is at the borderline of elevation and BNP slightly elevated.  Patient was given 1.5 L of normal saline and morphine in the ED.  Blood pressure normalized with the IV fluids and patient has remained hemodynamically stable since that time.  She will be  admitted for ongoing evaluation and management of acute pulmonary embolism with syncope and transient hypotension.  Review of Systems:  All other systems reviewed and apart from HPI, are negative.  Past Medical History:  Diagnosis Date  . Allergic rhinitis due to pollen 04/27/2007  . Arthritis    "in q joint" (08/07/2013)  . CHF (congestive heart failure) (HCC)   . Coronary atherosclerosis of native coronary artery   . Cough   . Disorder of bone and cartilage, unspecified   . Edema 05/03/2013  . Exertional shortness of breath    "sometimes" (08/07/2013)  . First degree atrioventricular block   . Gout, unspecified 04/19/2013  . Hyperlipidemia 04/27/2007  . Hypertension   . Muscle spasm   . Muscle weakness (generalized)   . Myocardial infarction (HCC) ~ 1970  . Onychia and paronychia of toe   . Osteoarthrosis, unspecified whether generalized or localized, unspecified site 04/27/2007  . Other fall   . Other malaise and fatigue   . Pneumonia 05/2018  . Prepatellar bursitis   . Seizures (HCC)   . Stroke (HCC) 01/17/2014  . TIA (transient ischemic attack)    "a few one summer" (08/07/2013)  . Type II diabetes mellitus (HCC)    "fasting 90-110s" (08/07/2013)  . Unspecified essential hypertension 08/08/2013  . Unspecified vitamin D deficiency     Past Surgical History:  Procedure Laterality Date  . ABDOMINAL HYSTERECTOMY  04/1980  . APPENDECTOMY  1960  . CARDIAC CATHETERIZATION  2003  . CATARACT EXTRACTION W/ INTRAOCULAR LENS  IMPLANT, BILATERAL Bilateral ~ 2012  . CHOLECYSTECTOMY N/A 06/26/2018   Procedure: LAPAROSCOPIC CHOLECYSTECTOMY POSSIBLE INTRAOPERATIVE CHOLANGIOGRAM;  Surgeon: Abigail Miyamoto,  MD;  Location: [facility] OR;  Service: General;  Laterality: N/A;  . JOINT REPLACEMENT    . REPLACEMENT TOTAL KNEE Right 09/2005  . RIGHT/LEFT HEART CATH AND CORONARY ANGIOGRAPHY N/A 06/01/2018   Procedure: RIGHT/LEFT HEART CATH AND CORONARY ANGIOGRAPHY;  Surgeon: Corky Crafts,  MD;  Location: [facility] INVASIVE CV LAB;  Service: Cardiovascular;  Laterality: N/A;  . SHOULDER OPEN ROTATOR CUFF REPAIR Right 07/1999  . TONSILLECTOMY  08/1974  . TOTAL KNEE ARTHROPLASTY Left 08/07/2013   Procedure: TOTAL KNEE ARTHROPLASTY- LEFT;  Surgeon: Dannielle Huh, MD;  Location: [facility] OR;  Service: Orthopedics;  Laterality: Left;  . TRANSTHORACIC ECHOCARDIOGRAM  2003   EF 55-65%; mild concentric LVH     reports that she has never smoked. She has never used smokeless tobacco. She reports that she does not drink alcohol or use drugs.  Allergies  Allergen Reactions  . Clonidine Derivatives Swelling    [name]  . Fish Allergy Diarrhea, Swelling and Other (See Comments)    Turns skin "black," but can tolerate white fish Salmon- Diarrhea  . Shellfish Allergy Hives  . Doxycycline Rash  . Indomethacin Other (See Comments)    Reaction not recalled by the patient  . Lyrica [Pregabalin] Other (See Comments)    Hallucinations   . Methyldopa Other (See Comments)    Aldomet (for hypertension): Reaction not recalled by the patient  . Orange Fruit [Citrus] Other (See Comments)    Indigestion/heartburn  . Strawberry (Diagnostic) Itching  . Cetirizine Hcl Itching and Rash  . Codeine Itching  . Levaquin [Levofloxacin In D5w] Rash  . Tomato Rash    Family History  Problem Relation Age of Onset  . Heart attack Father      Prior to Admission medications   Medication Sig Start Date End Date Taking? Authorizing Provider  albuterol (PROVENTIL HFA;VENTOLIN HFA) 108 (90 Base) MCG/ACT inhaler Inhale 1-2 puffs into the lungs every 6 (six) hours as needed for wheezing or shortness of breath.    Yes [provider]  allopurinol (ZYLOPRIM) 100 MG tablet Take 100 mg by mouth at bedtime.    Yes [provider]  aspirin EC 81 MG tablet Take 81 mg by mouth daily.   Yes [provider]  calcium-vitamin D (OSCAL  WITH D) 500-200 MG-UNIT tablet Take 1 tablet by mouth daily with breakfast.   Yes [provider]  carvedilol (COREG) 25 MG tablet Take 1 tablet (25 mg total) by mouth 2 (two) times daily with a meal. 06/04/18  Yes Berton Mount I, MD  D3-1000 1000 units capsule Take 1,000 Units by mouth daily. 06/07/18  Yes [provider]  diclofenac sodium (VOLTAREN) 1 % GEL Apply 1 application topically daily. Patient taking differently: Apply 1 application topically See admin instructions. Apply to painful areas of shoulders and arms daily as directed 02/22/18  Yes Helane Rima, DO  ferrous sulfate 325 (65 FE) MG tablet Take 1 tablet (325 mg total) by mouth 2 (two) times daily with a meal. 06/04/18  Yes Barnetta Chapel, MD  furosemide (LASIX) 40 MG tablet Take 1 tablet (40 mg total) by mouth daily. 06/28/18  Yes Rhetta Mura, MD  gabapentin (NEURONTIN) 100 MG capsule Take 1 capsule (100 mg total) by mouth at bedtime. 05/19/18  Yes Helane Rima, DO  hydrALAZINE (APRESOLINE) 100 MG tablet TAKE 1 TABLET BY MOUTH THREE TIMES DAILY 04/05/18  Yes Helane Rima, DO  HYDROcodone-acetaminophen (NORCO/VICODIN) 5-325 MG tablet  Take 1 tablet by mouth every 4 (four) hours as needed for moderate pain.   Yes [provider]  hydroxychloroquine (PLAQUENIL) 200 MG tablet Take 200 mg by mouth 2 (two) times daily.   Yes [provider]  hydroxypropyl methylcellulose / hypromellose (ISOPTO TEARS / GONIOVISC) 2.5 % ophthalmic solution Place 1 drop into both eyes 4 (four) times daily as needed for dry eyes. Patient taking differently: Place 1 drop into both eyes daily.  07/07/17  Yes Helane Rima, DO  isosorbide mononitrate (IMDUR) 30 MG 24 hr tablet Take 1 tablet (30 mg total) by mouth daily. Patient taking differently: Take 30 mg by mouth at bedtime.  06/05/18  Yes Barnetta Chapel, MD  leflunomide (ARAVA) 20 MG tablet Take 20 mg by mouth daily.   Yes [provider]    metFORMIN (GLUCOPHAGE) 500 MG tablet Take 1 tablet (500 mg total) by mouth daily with breakfast. 01/28/18  Yes Helane Rima, DO  Olopatadine HCl 0.2 % SOLN Apply 1 drop to eye daily. Patient taking differently: Place 1 drop into both eyes daily.  02/22/18  Yes Helane Rima, DO  potassium chloride SA (K-DUR,KLOR-CON) 20 MEQ tablet Take 2 tablets (40 mEq total) by mouth daily. Patient taking differently: Take 20 mEq by mouth 2 (two) times daily.  06/28/18  Yes Rhetta Mura, MD  predniSONE (DELTASONE) 10 MG tablet Take 1 tablet (10 mg total) by mouth daily with breakfast. 03/25/18  Yes Helane Rima, DO  sacubitril-valsartan (ENTRESTO) 49-51 MG Take 1 tablet by mouth daily at 12 noon.    Yes [provider]  spironolactone (ALDACTONE) 25 MG tablet Take 12.5 mg by mouth daily.   Yes [provider]  triamcinolone cream (KENALOG) 0.1 % Apply 1 application topically 2 (two) times daily as needed (for itching- to affected sites).    Yes [provider]  vitamin C (ASCORBIC ACID) 500 MG tablet Take 500 mg by mouth daily.   Yes [provider]    Physical Exam: Vitals:   07/24/18 0100 07/24/18 0115 07/24/18 0130 07/24/18 0145  BP: (!) 155/78 (!) 166/75 (!) 150/79 (!) 159/86  Pulse: 85 83 82 81  Resp: 19 18 18 16   SpO2: 100% 98% 97% 98%  Weight:         Constitutional: NAD, calm  Eyes: PERTLA, lids and conjunctivae normal ENMT: Mucous membranes are moist. Posterior pharynx clear of any exudate or lesions.   Neck: normal, supple, no masses, no thyromegaly Respiratory: clear to auscultation bilaterally, no wheezing, no crackles. Normal respiratory effort.   Cardiovascular: S1 & S2 heard, regular rate and rhythm. No extremity edema.  Abdomen: No distension, no tenderness, soft. Bowel sounds normal.  Musculoskeletal: no clubbing / cyanosis. Bilateral shoulder pain with ROM, Lt > Rt.    Skin: No wounds or rash on exposed surfaces. Warm, dry,  well-perfused. Neurologic: CN 2-12 grossly intact. Sensation intact. Strength 5/5 in all 4 limbs.  Psychiatric: Alert and oriented to person, place, and situation. Calm, cooperative.    Labs on Admission: I have personally reviewed following labs and imaging studies  CBC: Recent Labs  Lab 07/23/18 2100 07/23/18 2115  WBC 9.6  --   NEUTROABS 5.1  --   HGB 11.7* 12.9  HCT 37.4 38.0  MCV 94.9  --   PLT 287  --    Basic Metabolic Panel: Recent Labs  Lab 07/23/18 2100 07/23/18 2115  NA 134* 137  K 4.5 4.5  CL 102 103  CO2  21*  --   GLUCOSE 149* 149*  BUN 39* 57*  CREATININE 1.22* 1.20*  CALCIUM 9.1  --    GFR: Estimated Creatinine Clearance: 34.2 mL/min (A) (by C-G formula based on SCr of 1.2 mg/dL (H)). Liver Function Tests: Recent Labs  Lab 07/23/18 2100  AST 23  ALT 8  ALKPHOS 61  BILITOT 0.8  PROT 6.1*  ALBUMIN 3.4*   Recent Labs  Lab 07/23/18 2100  LIPASE 50   No results for input(s): AMMONIA in the last 168 hours. Coagulation Profile: No results for input(s): INR, PROTIME in the last 168 hours. Cardiac Enzymes: Recent Labs  Lab 07/23/18 2100  TROPONINI 0.03*   BNP (last 3 results) No results for input(s): PROBNP in the last 8760 hours. HbA1C: No results for input(s): HGBA1C in the last 72 hours. CBG: No results for input(s): GLUCAP in the last 168 hours. Lipid Profile: No results for input(s): CHOL, HDL, LDLCALC, TRIG, CHOLHDL, LDLDIRECT in the last 72 hours. Thyroid Function Tests: No results for input(s): TSH, T4TOTAL, FREET4, T3FREE, THYROIDAB in the last 72 hours. Anemia Panel: No results for input(s): VITAMINB12, FOLATE, FERRITIN, TIBC, IRON, RETICCTPCT in the last 72 hours. Urine analysis:    Component Value Date/Time   COLORURINE YELLOW 06/22/2018 2156   APPEARANCEUR CLEAR 06/22/2018 2156   LABSPEC 1.006 06/22/2018 2156   PHURINE 6.0 06/22/2018 2156   GLUCOSEU NEGATIVE 06/22/2018 2156   HGBUR NEGATIVE 06/22/2018 2156    BILIRUBINUR NEGATIVE 06/22/2018 2156   BILIRUBINUR Negative 01/13/2017 1234   KETONESUR NEGATIVE 06/22/2018 2156   PROTEINUR NEGATIVE 06/22/2018 2156   UROBILINOGEN 0.2 01/13/2017 1234   UROBILINOGEN 1.0 09/11/2015 0135   NITRITE NEGATIVE 06/22/2018 2156   LEUKOCYTESUR NEGATIVE 06/22/2018 2156   Sepsis Labs: @LABRCNTIP (procalcitonin:4,lacticidven:4) )No results found for this or any previous visit (from the past 240 hour(s)).   Radiological Exams on Admission: Ct Head Wo Contrast  Result Date: 07/23/2018 CLINICAL DATA:  Patient with generalized weakness. EXAM: CT HEAD WITHOUT CONTRAST TECHNIQUE: Contiguous axial images were obtained from the base of the skull through the vertex without intravenous contrast. COMPARISON:  Brain CT 06/24/2018. FINDINGS: Brain: Ventricles and sulci are appropriate for patient's age. Periventricular and subcortical white matter hypodensity compatible with chronic microvascular ischemic changes. No evidence for acute cortically based infarct, intracranial hemorrhage, mass lesion or mass-effect. Vascular: Unremarkable Skull: Intact. Sinuses/Orbits: Unremarkable orbits. Paranasal sinuses are well aerated. Mastoid air cells are unremarkable. Other: None. IMPRESSION: No acute intracranial process. Electronically Signed   By: Annia Belt [name]   On: 07/23/2018 22:23   Dg Chest Port 1 View  Result Date: 07/23/2018 CLINICAL DATA:  Weakness and diaphoresis EXAM: PORTABLE CHEST 1 VIEW COMPARISON:  Chest radiograph 06/22/2018 FINDINGS: Cardiomegaly is unchanged. Improved vascular congestion with mild residual. No focal airspace disease, large pleural effusion, or pneumothorax. Chronic deformity of both shoulders. IMPRESSION: Cardiomegaly. Improved vascular congestion from exam last month with mild residual. Electronically Signed   By: Narda Rutherford [name]   On: 07/23/2018 21:40   Ct Angio Chest/abd/pel For Dissection W And/or Wo Contrast  Result Date: 07/24/2018 CLINICAL  DATA:  84 year old with recurrent syncope. EXAM: CT ANGIOGRAPHY CHEST, ABDOMEN AND PELVIS TECHNIQUE: Multidetector CT imaging through the chest, abdomen and pelvis was performed using the standard protocol during bolus administration of intravenous contrast. Multiplanar reconstructed images and MIPs were obtained and reviewed to evaluate the vascular anatomy. CONTRAST:  ISOVUE-370 IOPAMIDOL (ISOVUE-370) INJECTION 76% COMPARISON:  Radiograph yesterday. Chest CT 06/15/2018. Chest abdomen pelvis CT  09/11/2015 FINDINGS: CTA CHEST FINDINGS Cardiovascular: Aortic tortuosity and atherosclerosis without dissection, acute aortic syndrome, aortic hematoma or aneurysm. Common origin of the brachiocephalic and left common carotid artery, normal variant arch configuration. Small volume thrombus in the right lower lobe pulmonary artery extending into segmental branch consistent with acute pulmonary embolus, new from prior exam. Multi chamber cardiomegaly is unchanged from prior exam. No evidence of right heart strain. Small pericardial effusion with fluid adjacent to the right ventricle. Mediastinum/Nodes: No enlarged mediastinal or hilar lymph nodes. Heterogeneous thyroid gland with subcentimeter nodule in the left lobe. The esophagus is nondistended. Lungs/Pleura: Prior nodular opacity in the right upper lobe has resolved since prior exam, was likely infectious or inflammatory. Subpleural nodule in the right middle lobe image 54 series 8 is unchanged. Hypoventilatory change at the lung bases. No confluent airspace disease, pleural effusion or pulmonary edema. Musculoskeletal: Chronic osseous remottling of the shoulders, bilateral shoulder joint effusions. Multilevel degenerative change throughout the spine. Prominent Schmorl's node superior endplate of T4. Review of the MIP images confirms the above findings. CTA ABDOMEN AND PELVIS FINDINGS VASCULAR Aorta: Moderate atherosclerosis without dissection, evidence of  vasculitis, aneurysm or significant stenosis. Celiac: Mild atherosclerosis at the origin without stenosis or dissection. SMA: Mild plaque at the origin without significant stenosis or dissection. Mesenteric branches are patent. Renals: Patent without dissection or significant stenosis. IMA: Patent without dissection or stenosis. Inflow: Mild atherosclerotic calcification without dissection or stenosis. Veins: No obvious venous abnormality within the limitations of this arterial phase study. Review of the MIP images confirms the above findings. NON-VASCULAR Hepatobiliary: No focal lesion on arterial phase exam. Common bile duct dilatation with CBD measuring 14 mm at the porta hepatis, unchanged from MRCP last month. Interval cholecystectomy. No fluid collection in the gallbladder fossa. Pancreas: No ductal dilatation or inflammation. Spleen: Normal in size without focal abnormality. Adrenals/Urinary Tract: No adrenal nodule. No hydronephrosis. Mild symmetric perinephric edema appears chronic. Simple cyst in the right kidney. Urinary bladder is distended, no bladder wall thickening. Stomach/Bowel: Colonic diverticulosis without diverticulitis. Moderate stool burden throughout the colon. No small bowel dilatation, inflammation, or obstruction. Stomach distended with ingested contents. Lymphatic: No abdominopelvic adenopathy. Reproductive: Status post hysterectomy. No adnexal masses. Other: No free air free fluid. Musculoskeletal: Chronic L1 compression fracture, upper most non-rib-bearing lumbar vertebra. Multilevel degenerative change in the lumbar spine. Anterolisthesis of L4 on L5 (numbering from superior most non-rib-bearing lumbar vertebra) is likely facet mediated. Peripherally sclerotic lucent lesion in the left proximal femur stable dating back to 09/11/2015 abdominal CT and considered benign. No acute osseous abnormalities. Review of the MIP images confirms the above findings. IMPRESSION: 1. Acute small volume  pulmonary embolus in the right lower lobe, new from CT last month. 2. Diffuse thoracoabdominal aortic atherosclerosis without dissection or acute aortic abnormality. Chronic cardiomegaly. 3. Prior right upper lobe nodular opacity on chest CT has resolved, was likely infectious or inflammatory. 4. No acute findings in the abdomen or pelvis. Colonic diverticulosis without diverticulitis. 5. Recent cholecystectomy without complication. Biliary dilatation is chronic. Aortic Atherosclerosis (ICD10-I70.0). These results were called by telephone at the time of interpretation on 07/24/2018 at 1:18 am to PA [facility] , who verbally acknowledged these results. Electronically Signed   By: Narda Rutherford [name]   On: 07/24/2018 01:19    EKG: Independently reviewed. Sinus rhythm, LVH.   Assessment/Plan   1. Acute pulmonary embolism  - Presents with acute-onset of lightheadedness and had a brief syncopal episode with hypotension in ED  -  ED workup notable for acute PE in RLL  - BP normalized with IVF, currently on rm air and denying chest pain  - Troponin minimally elevated  - Start IV heparin, continue cardiac monitoring, hold beta-blocker, check echocardiogram and venous US LE's    2. Syncope  - Likely secondary to acute PE  - Continue cardiac monitoring, check echo as above   3. Chronic combined systolic & diastolic CHF  - Appears roughly euvolemic  - She was given 1.5 liters NS in ED for syncope with hypotension  - Hold diuretics initially, follow daily wt and I/O's, hold Coreg in light of acute PE with syncope  4. CAD - No anginal complaints  - Non-obstructive CAD on cath in July 2019  - Continue ASA and nitrates, hold beta-blocker initially in light of acute PE with syncope   5. Rheumatoid arthritis  - Shoulders have been hurting lately  - Continue leflunomide, Plaquenil, and daily prednisone   - Continue as needed APAP and Norco    6. CKD stage III  - SCr is 1.22 on admission, appears  stable  - Renally-dose medications    7. Type II DM  - A1c was 5.9% last month  - Managed at home with metformin, held on admission  - Check CBG's and use a low-intensity SSI with Novolog as needed while in hospital    8. Hypertension  - Hold Coreg in light of acute PE with syncope - Resume hydralazine in AM if BP remains stable     DVT prophylaxis: IV heparin infusion  Code Status: Full  Family Communication: Son updated at bedside Consults called: None Admission status: inpatient     Briscoe Deutscher, MD [facility] Pager [phone]  If 7PM-7AM, please contact night-coverage [email or web address] Password [email or web address]  07/24/2018, 2:11 AM

## 2018-07-24 NOTE — Progress Notes (Signed)
ANTICOAGULATION CONSULT NOTE - Initial Consult  Pharmacy Consult for Heparin Indication: pulmonary embolus  Allergies  Allergen Reactions  . Clonidine Derivatives Swelling    [name]  . Fish Allergy Diarrhea, Swelling and Other (See Comments)    Turns skin "black," but can tolerate white fish Salmon- Diarrhea  . Shellfish Allergy Hives  . Doxycycline Rash  . Indomethacin Other (See Comments)    Reaction not recalled by the patient  . Lyrica [Pregabalin] Other (See Comments)    Hallucinations   . Methyldopa Other (See Comments)    Aldomet (for hypertension): Reaction not recalled by the patient  . Orange Fruit [Citrus] Other (See Comments)    Indigestion/heartburn  . Strawberry (Diagnostic) Itching  . Cetirizine Hcl Itching and Rash  . Codeine Itching  . Levaquin [Levofloxacin In D5w] Rash  . Tomato Rash    Patient Measurements: Weight: 174 lb 2.6 oz (79 kg) Heparin Dosing Weight: 70 kg  Vital Signs: BP: 159/86 (09/15 0145) Pulse Rate: 81 (09/15 0145)  Labs: Recent Labs    07/23/18 2100 07/23/18 2115  HGB 11.7* 12.9  HCT 37.4 38.0  PLT 287  --   CREATININE 1.22* 1.20*  TROPONINI 0.03*  --     Estimated Creatinine Clearance: 34.2 mL/min (A) (by C-G formula based on SCr of 1.2 mg/dL (H)).   Medical History: Past Medical History:  Diagnosis Date  . Allergic rhinitis due to pollen 04/27/2007  . Arthritis    "in q joint" (08/07/2013)  . CHF (congestive heart failure) (HCC)   . Coronary atherosclerosis of native coronary artery   . Cough   . Disorder of bone and cartilage, unspecified   . Edema 05/03/2013  . Exertional shortness of breath    "sometimes" (08/07/2013)  . First degree atrioventricular block   . Gout, unspecified 04/19/2013  . Hyperlipidemia 04/27/2007  . Hypertension   . Muscle spasm   . Muscle weakness (generalized)   . Myocardial infarction (HCC) ~ 1970  . Onychia  and paronychia of toe   . Osteoarthrosis, unspecified whether generalized or localized, unspecified site 04/27/2007  . Other fall   . Other malaise and fatigue   . Pneumonia 05/2018  . Prepatellar bursitis   . Seizures (HCC)   . Stroke (HCC) 01/17/2014  . TIA (transient ischemic attack)    "a few one summer" (08/07/2013)  . Type II diabetes mellitus (HCC)    "fasting 90-110s" (08/07/2013)  . Unspecified essential hypertension 08/08/2013  . Unspecified vitamin D deficiency     Medications:  No current facility-administered medications on file prior to encounter.    Current Outpatient Medications on File Prior to Encounter  Medication Sig Dispense Refill  . albuterol (PROVENTIL HFA;VENTOLIN HFA) 108 (90 Base) MCG/ACT inhaler Inhale 1-2 puffs into the lungs every 6 (six) hours as needed for wheezing or shortness of breath.     . allopurinol (ZYLOPRIM) 100 MG tablet Take 100 mg by mouth at bedtime.     Marland Kitchen aspirin EC 81 MG tablet Take 81 mg by mouth daily.    . calcium-vitamin D (OSCAL WITH D) 500-200 MG-UNIT tablet Take 1 tablet by mouth daily with breakfast.    . carvedilol (COREG) 25 MG tablet Take 1 tablet (25 mg total) by mouth 2 (two) times daily with a meal. 60 tablet 0  . D3-1000 1000 units capsule Take 1,000 Units by mouth daily.  0  . diclofenac sodium (VOLTAREN) 1 % GEL Apply  1 application topically daily. (Patient taking differently: Apply 1 application topically See admin instructions. Apply to painful areas of shoulders and arms daily as directed) 100 g 4  . ferrous sulfate 325 (65 FE) MG tablet Take 1 tablet (325 mg total) by mouth 2 (two) times daily with a meal. 60 tablet 0  . furosemide (LASIX) 40 MG tablet Take 1 tablet (40 mg total) by mouth daily. 30 tablet 0  . gabapentin (NEURONTIN) 100 MG capsule Take 1 capsule (100 mg total) by mouth at bedtime. 30 capsule 3  . hydrALAZINE (APRESOLINE) 100 MG tablet TAKE 1 TABLET BY MOUTH THREE TIMES DAILY 90 tablet 2  .  HYDROcodone-acetaminophen (NORCO/VICODIN) 5-325 MG tablet Take 1 tablet by mouth every 4 (four) hours as needed for moderate pain.    . hydroxychloroquine (PLAQUENIL) 200 MG tablet Take 200 mg by mouth 2 (two) times daily.    . hydroxypropyl methylcellulose / hypromellose (ISOPTO TEARS / GONIOVISC) 2.5 % ophthalmic solution Place 1 drop into both eyes 4 (four) times daily as needed for dry eyes. (Patient taking differently: Place 1 drop into both eyes daily. ) 15 mL 2  . isosorbide mononitrate (IMDUR) 30 MG 24 hr tablet Take 1 tablet (30 mg total) by mouth daily. (Patient taking differently: Take 30 mg by mouth at bedtime. ) 30 tablet 0  . leflunomide (ARAVA) 20 MG tablet Take 20 mg by mouth daily.    . metFORMIN (GLUCOPHAGE) 500 MG tablet Take 1 tablet (500 mg total) by mouth daily with breakfast. 90 tablet 2  . Olopatadine HCl 0.2 % SOLN Apply 1 drop to eye daily. (Patient taking differently: Place 1 drop into both eyes daily. ) 2.5 mL 3  . potassium chloride SA (K-DUR,KLOR-CON) 20 MEQ tablet Take 2 tablets (40 mEq total) by mouth daily. (Patient taking differently: Take 20 mEq by mouth 2 (two) times daily. )    . predniSONE (DELTASONE) 10 MG tablet Take 1 tablet (10 mg total) by mouth daily with breakfast. 5 tablet 0  . sacubitril-valsartan (ENTRESTO) 49-51 MG Take 1 tablet by mouth daily at 12 noon.     Marland Kitchen spironolactone (ALDACTONE) 25 MG tablet Take 12.5 mg by mouth daily.    Marland Kitchen triamcinolone cream (KENALOG) 0.1 % Apply 1 application topically 2 (two) times daily as needed (for itching- to affected sites).     . vitamin C (ASCORBIC ACID) 500 MG tablet Take 500 mg by mouth daily.      Assessment: 84 y.o. female with PE for heparin Goal of Therapy:  Heparin level 0.3-0.7 units/ml Monitor platelets by anticoagulation protocol: Yes   Plan:  Heparin 4000 units IV bolus, then start heparin 1100 units/hr Check heparin level in 8 hours.   Eddie Candle 07/24/2018,2:09 AM

## 2018-07-25 ENCOUNTER — Ambulatory Visit: Payer: Medicare Other | Admitting: Family Medicine

## 2018-07-25 ENCOUNTER — Inpatient Hospital Stay (HOSPITAL_COMMUNITY): Payer: Medicare Other

## 2018-07-25 ENCOUNTER — Ambulatory Visit: Payer: Self-pay | Admitting: *Deleted

## 2018-07-25 DIAGNOSIS — I351 Nonrheumatic aortic (valve) insufficiency: Secondary | ICD-10-CM

## 2018-07-25 DIAGNOSIS — I2699 Other pulmonary embolism without acute cor pulmonale: Secondary | ICD-10-CM

## 2018-07-25 LAB — CBC
HCT: 32.8 % — ABNORMAL LOW (ref 36.0–46.0)
Hemoglobin: 10.6 g/dL — ABNORMAL LOW (ref 12.0–15.0)
MCH: 29.9 pg (ref 26.0–34.0)
MCHC: 32.3 g/dL (ref 30.0–36.0)
MCV: 92.7 fL (ref 78.0–100.0)
Platelets: 237 10*3/uL (ref 150–400)
RBC: 3.54 MIL/uL — ABNORMAL LOW (ref 3.87–5.11)
RDW: 20.5 % — ABNORMAL HIGH (ref 11.5–15.5)
WBC: 9.6 10*3/uL (ref 4.0–10.5)

## 2018-07-25 LAB — BASIC METABOLIC PANEL
Anion gap: 7 (ref 5–15)
BUN: 23 mg/dL (ref 8–23)
CO2: 25 mmol/L (ref 22–32)
Calcium: 9.1 mg/dL (ref 8.9–10.3)
Chloride: 109 mmol/L (ref 98–111)
Creatinine, Ser: 1 mg/dL (ref 0.44–1.00)
GFR calc Af Amer: 57 mL/min — ABNORMAL LOW (ref >60)
GFR calc non Af Amer: 50 mL/min — ABNORMAL LOW (ref >60)
Glucose, Bld: 109 mg/dL — ABNORMAL HIGH (ref 70–99)
Potassium: 3.6 mmol/L (ref 3.5–5.1)
Sodium: 141 mmol/L (ref 135–145)

## 2018-07-25 LAB — GLUCOSE, CAPILLARY
Glucose-Capillary: 127 mg/dL — ABNORMAL HIGH (ref 70–99)
Glucose-Capillary: 163 mg/dL — ABNORMAL HIGH (ref 70–99)
Glucose-Capillary: 151 mg/dL — ABNORMAL HIGH (ref 70–99)
Glucose-Capillary: 182 mg/dL — ABNORMAL HIGH (ref 70–99)

## 2018-07-25 LAB — HEPARIN LEVEL (UNFRACTIONATED): Heparin Unfractionated: 0.43 IU/mL (ref 0.30–0.70)

## 2018-07-25 LAB — ECHOCARDIOGRAM LIMITED
Height: 64 in
Weight: 2857.16 oz

## 2018-07-25 MED ORDER — HYDRALAZINE HCL 25 MG PO TABS
25.0000 mg | ORAL_TABLET | Freq: Three times a day (TID) | ORAL | Status: DC
  Administered 2018-07-25 – 2018-07-27 (×7): 25 mg via ORAL
  Filled 2018-07-25 (×7): qty 1

## 2018-07-25 MED ORDER — FUROSEMIDE 40 MG PO TABS
40.0000 mg | ORAL_TABLET | Freq: Every day | ORAL | Status: DC
  Administered 2018-07-25 – 2018-07-27 (×3): 40 mg via ORAL
  Filled 2018-07-25 (×3): qty 1

## 2018-07-25 MED ORDER — RIVAROXABAN 15 MG PO TABS
15.0000 mg | ORAL_TABLET | Freq: Two times a day (BID) | ORAL | Status: DC
  Administered 2018-07-25 – 2018-07-27 (×4): 15 mg via ORAL
  Filled 2018-07-25 (×4): qty 1

## 2018-07-25 MED ORDER — RIVAROXABAN 20 MG PO TABS: 20.0000 mg | ORAL_TABLET | Freq: Every day | ORAL | Status: DC

## 2018-07-25 MED ORDER — CARVEDILOL 3.125 MG PO TABS
3.1250 mg | ORAL_TABLET | Freq: Two times a day (BID) | ORAL | Status: DC
  Administered 2018-07-25 – 2018-07-27 (×4): 3.125 mg via ORAL
  Filled 2018-07-25 (×4): qty 1

## 2018-07-25 NOTE — Progress Notes (Signed)
VASCULAR LAB PRELIMINARY  PRELIMINARY  PRELIMINARY  PRELIMINARY  Bilateral lower extremity venous duplex completed.    Preliminary report:  There is acute DVT in the right peroneal vein. All other veins appear thrombus free.   Liana Camerer, RVT 07/25/2018, 9:51 AM

## 2018-07-25 NOTE — Progress Notes (Signed)
ANTICOAGULATION CONSULT NOTE - Initial Consult  Pharmacy Consult for Xarelto Indication: pulmonary embolus  Allergies  Allergen Reactions  . Clonidine Derivatives Swelling    [name]  . Fish Allergy Diarrhea, Swelling and Other (See Comments)    Turns skin "black," but can tolerate white fish Salmon- Diarrhea  . Shellfish Allergy Hives  . Doxycycline Rash  . Indomethacin Other (See Comments)    Reaction not recalled by the patient  . Lyrica [Pregabalin] Other (See Comments)    Hallucinations   . Methyldopa Other (See Comments)    Aldomet (for hypertension): Reaction not recalled by the patient  . Morphine And Related Other (See Comments)    Family reports it drops her bp that she needs iv fluids  . Orange Fruit [Citrus] Other (See Comments)    Indigestion/heartburn  . Strawberry (Diagnostic) Itching  . Cetirizine Hcl Itching and Rash  . Codeine Itching  . Levaquin [Levofloxacin In D5w] Rash  . Tomato Rash    Patient Measurements: Height: 5\' 4"  (162.6 cm) Weight: 178 lb 9.2 oz (81 kg) IBW/kg (Calculated) : 54.7 Heparin Dosing Weight:    Vital Signs: Temp: 98.3 F (36.8 C) (09/16 1051) Temp Source: Oral (09/16 1051) BP: 128/73 (09/16 1051) Pulse Rate: 95 (09/16 1051)  Labs: Recent Labs    07/23/18 2100 07/23/18 2115 07/24/18 0342 07/24/18 0920 07/24/18 0921 07/24/18 1442 07/24/18 1822 07/25/18 0808  HGB 11.7* 12.9  --   --   --   --   --  10.6*  HCT 37.4 38.0  --   --   --   --   --  32.8*  PLT 287  --   --   --   --   --   --  237  HEPARINUNFRC  --   --   --   --  0.53  --  0.54 0.43  CREATININE 1.22* 1.20*  --  0.97  --   --   --  1.00  TROPONINI 0.03*  --  0.03* 0.03*  --  <0.03  --   --     Estimated Creatinine Clearance: 41.6 mL/min (by C-G formula based on SCr of 1 mg/dL).   Medical History: Past Medical History:  Diagnosis Date  . Allergic rhinitis due to pollen  04/27/2007  . Arthritis    "in q joint" (08/07/2013)  . CHF (congestive heart failure) (HCC)   . Coronary atherosclerosis of native coronary artery   . Cough   . Disorder of bone and cartilage, unspecified   . Edema 05/03/2013  . Exertional shortness of breath    "sometimes" (08/07/2013)  . First degree atrioventricular block   . Gout, unspecified 04/19/2013  . Hyperlipidemia 04/27/2007  . Hypertension   . Muscle spasm   . Muscle weakness (generalized)   . Myocardial infarction (HCC) ~ 1970  . Onychia and paronychia of toe   . Osteoarthrosis, unspecified whether generalized or localized, unspecified site 04/27/2007  . Other fall   . Other malaise and fatigue   . Pneumonia 05/2018  . Prepatellar bursitis   . Seizures (HCC)   . Stroke (HCC) 01/17/2014  . TIA (transient ischemic attack)    "a few one summer" (08/07/2013)  . Type II diabetes mellitus (HCC)    "fasting 90-110s" (08/07/2013)  . Unspecified essential hypertension 08/08/2013  . Unspecified vitamin D deficiency     Assessment:  Anticoag: heparin for PE. HL 0.43. Hg down to 10.6.  Plts 237 ok. Transition to Xarelto  Goal of Therapy:  Therapeutic oral anticoagulation   Plan:  - d/c IV heparin at 1800 and start Xarelto 15mg  BID x 21d, then 20mg  daily  Nathalie Cavendish S. , PharmD, BCPS Clinical Staff Pharmacist [phone]  Merilynn Finland Stillinger 07/25/2018,2:11 PM

## 2018-07-25 NOTE — Progress Notes (Signed)
ANTICOAGULATION CONSULT NOTE - Follow Up Consult  Pharmacy Consult for Heparin Indication: pulmonary embolus  Allergies  Allergen Reactions  . Clonidine Derivatives Swelling    [name]  . Fish Allergy Diarrhea, Swelling and Other (See Comments)    Turns skin "black," but can tolerate white fish Salmon- Diarrhea  . Shellfish Allergy Hives  . Doxycycline Rash  . Indomethacin Other (See Comments)    Reaction not recalled by the patient  . Lyrica [Pregabalin] Other (See Comments)    Hallucinations   . Methyldopa Other (See Comments)    Aldomet (for hypertension): Reaction not recalled by the patient  . Morphine And Related Other (See Comments)    Family reports it drops her bp that she needs iv fluids  . Orange Fruit [Citrus] Other (See Comments)    Indigestion/heartburn  . Strawberry (Diagnostic) Itching  . Cetirizine Hcl Itching and Rash  . Codeine Itching  . Levaquin [Levofloxacin In D5w] Rash  . Tomato Rash    Patient Measurements: Height: 5\' 4"  (162.6 cm) Weight: 178 lb 9.2 oz (81 kg) IBW/kg (Calculated) : 54.7 Heparin Dosing Weight:  72.2 kg  Vital Signs: Temp: 98.6 F (37 C) (09/16 0032) Temp Source: Oral (09/16 0032) BP: 157/91 (09/16 0135) Pulse Rate: 99 (09/16 0135)  Labs: Recent Labs    07/23/18 2100 07/23/18 2115 07/24/18 0342 07/24/18 0920 07/24/18 0921 07/24/18 1442 07/24/18 1822 07/25/18 0808  HGB 11.7* 12.9  --   --   --   --   --  10.6*  HCT 37.4 38.0  --   --   --   --   --  32.8*  PLT 287  --   --   --   --   --   --  237  HEPARINUNFRC  --   --   --   --  0.53  --  0.54 0.43  CREATININE 1.22* 1.20*  --  0.97  --   --   --   --   TROPONINI 0.03*  --  0.03* 0.03*  --  <0.03  --   --     Estimated Creatinine Clearance: 42.9 mL/min (by C-G formula based on SCr of 0.97 mg/dL).   Assessment:  Anticoag: heparin for PE. HL 0.43. Hg down to 10.6. Plts 237 ok.  Goal of  Therapy:  Heparin level 0.3-0.7 units/ml Monitor platelets by anticoagulation protocol: Yes   Plan:  -Continue heparin 1100 units/hr - Daily HL and CBC - r/u transition to DOAC  [name] [name] S. 07/27/18, PharmD, BCPS Clinical Staff Pharmacist [phone]  [phone] Stillinger 07/25/2018,9:56 AM

## 2018-07-25 NOTE — Progress Notes (Signed)
I received a [place] for an Advanced Directive support with the patient. I met with the patient and her daughter. The daughter had the AD document and we completed an overview. She said she would help the patient complete it at a later time. I spent time talking with the patient and provided spiritual support by leading in prayer. I shared that the Chaplain is available for additional support as needed or requested.    07/25/18 1100  Clinical Encounter Type  Visited With Patient and family together  Visit Type Spiritual support  Referral From Nurse  Consult/Referral To Chaplain  Stress Factors  Patient Stress Factors None identified  Family Stress Factors None identified    Chaplain Dr Redgie Grayer

## 2018-07-25 NOTE — Discharge Instructions (Addendum)
Information on my medicine - XARELTO (rivaroxaban)  WHY WAS XARELTO PRESCRIBED FOR YOU? Xarelto was prescribed to treat blood clots that may have been found in the veins of your legs (deep vein thrombosis) or in your lungs (pulmonary embolism) and to reduce the risk of them occurring again.  What do you need to know about Xarelto? The starting dose is one 15 mg tablet taken TWICE daily with food for the FIRST 21 DAYS then on (enter date)  08/15/2018  the dose is changed to one 20 mg tablet taken ONCE A DAY with your evening meal.  DO NOT stop taking Xarelto without talking to the health care provider who prescribed the medication.  Refill your prescription for 20 mg tablets before you run out.  After discharge, you should have regular check-up appointments with your healthcare provider that is prescribing your Xarelto.  In the future your dose may need to be changed if your kidney function changes by a significant amount.  What do you do if you miss a dose? If you are taking Xarelto TWICE DAILY and you miss a dose, take it as soon as you remember. You may take two 15 mg tablets (total 30 mg) at the same time then resume your regularly scheduled 15 mg twice daily the next day.  If you are taking Xarelto ONCE DAILY and you miss a dose, take it as soon as you remember on the same day then continue your regularly scheduled once daily regimen the next day. Do not take two doses of Xarelto at the same time.   Important Safety Information Xarelto is a blood thinner medicine that can cause bleeding. You should call your healthcare provider right away if you experience any of the following: ? Bleeding from an injury or your nose that does not stop. ? Unusual colored urine (red or dark brown) or unusual colored stools (red or black). ? Unusual bruising for unknown reasons. ? A serious fall or if you hit your head (even if there is no bleeding).  Some medicines may interact with Xarelto and  might increase your risk of bleeding while on Xarelto. To help avoid this, consult your healthcare provider or pharmacist prior to using any new prescription or non-prescription medications, including herbals, vitamins, non-steroidal anti-inflammatory drugs (NSAIDs) and supplements.  This website has more information on Xarelto: [email or web address].   Additional discharge instructions:  Please get your medications reviewed and adjusted by your Primary MD.  Please request your Primary MD to go over all Hospital Tests and Procedure/Radiological results at the follow up, please get all Hospital records sent to your Prim MD by signing hospital release before you go home.  If you had Pneumonia of Lung problems at the Hospital: Please get a 2 view Chest X ray done in 6-8 weeks after hospital discharge or sooner if instructed by your Primary MD.  If you have Congestive Heart Failure: Please call your Cardiologist or Primary MD anytime you have any of the following symptoms:  1) 3 pound weight gain in 24 hours or 5 pounds in 1 week  2) shortness of breath, with or without a dry hacking cough  3) swelling in the hands, feet or stomach  4) if you have to sleep on extra pillows at night in order to breathe  Follow cardiac low salt diet and 1.5 lit/day fluid restriction.  If you have diabetes Accuchecks 4 times/day, Once in AM empty stomach and then before each meal. Log in all results and show  them to your primary doctor at your next visit. If any glucose reading is under 80 or above 300 call your primary MD immediately.  If you have Seizure/Convulsions/Epilepsy: Please do not drive, operate heavy machinery, participate in activities at heights or participate in high speed sports until you have seen by Primary MD or a Neurologist and advised to do so again.  If you had Gastrointestinal Bleeding: Please ask your Primary MD to check a complete blood count within one week of discharge or at your next  visit. Your endoscopic/colonoscopic biopsies that are pending at the time of discharge, will also need to followed by your Primary MD.  Get Medicines reviewed and adjusted. Please take all your medications with you for your next visit with your Primary MD  Please request your Primary MD to go over all hospital tests and procedure/radiological results at the follow up, please ask your Primary MD to get all Hospital records sent to his/her office.  If you experience worsening of your admission symptoms, develop shortness of breath, life threatening emergency, suicidal or homicidal thoughts you must seek medical attention immediately by calling 911 or calling your MD immediately  if symptoms less severe.  You must read complete instructions/literature along with all the possible adverse reactions/side effects for all the Medicines you take and that have been prescribed to you. Take any new Medicines after you have completely understood and accpet all the possible adverse reactions/side effects.   Do not drive or operate heavy machinery when taking Pain medications.   Do not take more than prescribed Pain, Sleep and Anxiety Medications  Special Instructions: If you have smoked or chewed Tobacco  in the last 2 yrs please stop smoking, stop any regular Alcohol  and or any Recreational drug use.  Wear Seat belts while driving.  Please note You were cared for by a hospitalist during your hospital stay. If you have any questions about your discharge medications or the care you received while you were in the hospital after you are discharged, you can call the unit and asked to speak with the hospitalist on call if the hospitalist that took care of you is not available. Once you are discharged, your primary care physician will handle any further medical issues. Please note that NO REFILLS for any discharge medications will be authorized once you are discharged, as it is imperative that you return to your  primary care physician (or establish a relationship with a primary care physician if you do not have one) for your aftercare needs so that they can reassess your need for medications and monitor your lab values.  You can reach the hospitalist office at phone [phone] or fax [phone]   If you do not have a primary care physician, you can call [phone] for a physician referral.

## 2018-07-25 NOTE — Consult Note (Signed)
   [facility] CM Inpatient Consult   07/25/2018  [name] [name] 01/06/1930 [id number]   Patient is currently active with [facility] Care Management for chronic disease management services.  Patient has been engaged by a [employer] and CSW.  Our community based plan of care has focused on disease management and community resource support.  Patient will receive a post hospital follow up call and will be evaluated for monthly home visits for assessments and disease process education.  Made Inpatient Case Manager aware that [facility] Care Management following in the morning progression meeting. Of note, [facility] Care Management services does not replace or interfere with any services that are needed or arranged by inpatient case management or social work.  For additional questions or referrals please contact:   Charlesetta Shanks, RN BSN CCM [facility]  [phone] business mobile phone Toll free office [phone]

## 2018-07-25 NOTE — Progress Notes (Signed)
PROGRESS NOTE    [name] [name]  OZH:[id number] DOB: 1930/05/31 DOA: 07/23/2018 PCP: Helane Rima, DO    Brief Narrative:  84 y.o. female with medical history significant for rheumatoid arthritis, chronic combined systolic and diastolic CHF, coronary artery disease, chronic kidney disease stage III, hypertension, type 2 diabetes mellitus, and cholecystectomy 1 month ago, now presenting to the emergency department for evaluation of acute onset of lightheadedness and shoulder pain.  Patient had a brief syncopal episode with hypotension upon arrival to the ED.  She reports that she had been experiencing some increased shoulder pain that she attributes to her rheumatoid arthritis, but no chest pain or shortness of breath.  She was seated and watching TV when she developed acute onset of lightheadedness.  She apparently reported a burning chest discomfort earlier, but denies that now.  She denies any recent fevers or chills and denies cough, chest pain, leg swelling, or leg tenderness.  She denies any history of easy bleeding, melena, or hematochezia.  ED Course: Upon arrival to the ED, patient is found to be saturating adequately on room air, mildly tachypneic, transiently hypotensive to 80/53.  EKG features a sinus rhythm with LVH and chest x-ray is notable for cardiomegaly with improved vascular congestion.  Noncontrast head CT is negative for acute intracranial abnormality.  CTA chest/abdomen/pelvis is notable for acute pulmonary embolism in the right lower lobe.  Chemistry panel features a creatinine 1.22, similar to priors.  CBC is unremarkable, troponin is at the borderline of elevation and BNP slightly elevated.  Patient was given 1.5 L of normal saline and morphine in the ED.  Blood pressure normalized with the IV fluids and patient has remained hemodynamically stable since that time  Assessment & Plan:   Principal Problem:   Acute pulmonary embolism (HCC) Active Problems:   CAD (coronary  artery disease)   Rheumatoid arthritis involving multiple sites (HCC)   Syncope   CKD stage 3 due to type 2 diabetes mellitus (HCC)   DM (diabetes mellitus), type 2 with renal complications (HCC)   Chronic combined systolic and diastolic CHF (congestive heart failure) (HCC)   Hypertension  1. Acute pulmonary embolism  - Presents with acute-onset of lightheadedness and had a brief syncopal episode with hypotension in ED  - ED workup notable for acute PE in RLL  - BP normalized with IVF, currently on minimal O2 support - Troponin minimally elevated, likely secondary to presenting PE - Pt currently on heparin gtt -2d echo without R heart strain. EF improved over baseline -LE dopplers notable for R peroneal vein DVT -Will transition off heparin to PO xarelto  2. Syncope  - Likely secondary to acute PE  - Remains stable on tele  3. Chronic combined systolic & diastolic CHF  - Appears euvolemic at present  - Of note, patient was given 1.5 liters NS in ED for syncope with hypotension  - Diuretics initially on hold - Will resume diuretic and beta blocker as tolerated  4. CAD - No anginal complaints  - Non-obstructive CAD on cath in July 2019  - will continue with ASA and nitrates - Patient stable. Will resume beta blocker at lower dose to allow BP to tolerate  5. Rheumatoid arthritis  - Patient continued on leflunomide, Plaquenil, and daily prednisone   - Continue as needed APAP and Norco  - Seen by PT. Patient weaker. Recommendations for SNF. Have consulted SW   6. CKD stage III  - SCr is 1.22 on admission, clinically stable  at this time - Renally-dose medications   -Recheck bmet in AM  7. Type II DM  - A1c was 5.9% last month  - Managed at home with metformin, held on admission  - Will continue with SSI coverage  8. Hypertension  - Held Coreg in light of acute PE with syncope - Hydralazine was resumed, however patient became hypotensive with SBP in the 80's,  asymptomatic - Will hold further scheduled hydralazine. Continue on PRN IV hydralazine -  entresto continued with hold parameters  DVT prophylaxis: Heparin subQ Code Status: Full Family Communication: Pt in room, family at bedside Disposition Plan: SNF, timing uncertain  Consultants:     Procedures:     Antimicrobials: Anti-infectives (From admission, onward)   Start     Dose/Rate Route Frequency Ordered Stop   07/24/18 1000  hydroxychloroquine (PLAQUENIL) tablet 200 mg     200 mg Oral 2 times daily 07/24/18 0208        Subjective: No complaints at this time  Objective: Vitals:   07/24/18 2022 07/25/18 0032 07/25/18 0135 07/25/18 1051  BP: (!) 156/83 (!) 177/89 (!) 157/91 128/73  Pulse: (!) 53 99 99 95  Resp: 18 18  19   Temp: 98.3 F (36.8 C) 98.6 F (37 C)  98.3 F (36.8 C)  TempSrc: Oral Oral  Oral  SpO2: 98% 96%  100%  Weight:      Height:        Intake/Output Summary (Last 24 hours) at 07/25/2018 1635 Last data filed at 07/25/2018 1345 Gross per 24 hour  Intake 1167.02 ml  Output 950 ml  Net 217.02 ml   Filed Weights   07/23/18 2103 07/24/18 0330  Weight: 79 kg 81 kg    Examination: General exam: Awake, laying in bed, in nad Respiratory system: Normal respiratory effort, no wheezing Cardiovascular system: regular rate, s1, s2 Gastrointestinal system: Soft, nondistended, positive BS Central nervous system: CN2-12 grossly intact, strength intact Extremities: Perfused, no clubbing Skin: Normal skin turgor, no notable skin lesions seen Psychiatry: Mood normal // no visual hallucinations \  Data Reviewed: I have personally reviewed following labs and imaging studies  CBC: Recent Labs  Lab 07/23/18 2100 07/23/18 2115 07/25/18 0808  WBC 9.6  --  9.6  NEUTROABS 5.1  --   --   HGB 11.7* 12.9 10.6*  HCT 37.4 38.0 32.8*  MCV 94.9  --  92.7  PLT 287  --  237   Basic Metabolic Panel: Recent Labs  Lab 07/23/18 2100 07/23/18 2115 07/24/18 0920  07/25/18 0808  NA 134* 137 136 141  K 4.5 4.5 4.0 3.6  CL 102 103 103 109  CO2 21*  --  21* 25  GLUCOSE 149* 149* 128* 109*  BUN 39* 57* 30* 23  CREATININE 1.22* 1.20* 0.97 1.00  CALCIUM 9.1  --  9.0 9.1   GFR: Estimated Creatinine Clearance: 41.6 mL/min (by C-G formula based on SCr of 1 mg/dL). Liver Function Tests: Recent Labs  Lab 07/23/18 2100  AST 23  ALT 8  ALKPHOS 61  BILITOT 0.8  PROT 6.1*  ALBUMIN 3.4*   Recent Labs  Lab 07/23/18 2100  LIPASE 50   No results for input(s): AMMONIA in the last 168 hours. Coagulation Profile: No results for input(s): INR, PROTIME in the last 168 hours. Cardiac Enzymes: Recent Labs  Lab 07/23/18 2100 07/24/18 0342 07/24/18 0920 07/24/18 1442  TROPONINI 0.03* 0.03* 0.03* <0.03   BNP (last 3 results) No results for input(s): PROBNP  in the last 8760 hours. HbA1C: No results for input(s): HGBA1C in the last 72 hours. CBG: Recent Labs  Lab 07/24/18 1621 07/24/18 2152 07/25/18 0819 07/25/18 1206 07/25/18 1627  GLUCAP 180* 126* 127* 163* 151*   Lipid Profile: No results for input(s): CHOL, HDL, LDLCALC, TRIG, CHOLHDL, LDLDIRECT in the last 72 hours. Thyroid Function Tests: No results for input(s): TSH, T4TOTAL, FREET4, T3FREE, THYROIDAB in the last 72 hours. Anemia Panel: No results for input(s): VITAMINB12, FOLATE, FERRITIN, TIBC, IRON, RETICCTPCT in the last 72 hours. Sepsis Labs: Recent Labs  Lab 07/23/18 2115  LATICACIDVEN 1.72    No results found for this or any previous visit (from the past 240 hour(s)).   Radiology Studies: Ct Head Wo Contrast  Result Date: 07/23/2018 CLINICAL DATA:  Patient with generalized weakness. EXAM: CT HEAD WITHOUT CONTRAST TECHNIQUE: Contiguous axial images were obtained from the base of the skull through the vertex without intravenous contrast. COMPARISON:  Brain CT 06/24/2018. FINDINGS: Brain: Ventricles and sulci are appropriate for patient's age. Periventricular and  subcortical white matter hypodensity compatible with chronic microvascular ischemic changes. No evidence for acute cortically based infarct, intracranial hemorrhage, mass lesion or mass-effect. Vascular: Unremarkable Skull: Intact. Sinuses/Orbits: Unremarkable orbits. Paranasal sinuses are well aerated. Mastoid air cells are unremarkable. Other: None. IMPRESSION: No acute intracranial process. Electronically Signed   By: Annia Belt [name]   On: 07/23/2018 22:23   Dg Chest Port 1 View  Result Date: 07/23/2018 CLINICAL DATA:  Weakness and diaphoresis EXAM: PORTABLE CHEST 1 VIEW COMPARISON:  Chest radiograph 06/22/2018 FINDINGS: Cardiomegaly is unchanged. Improved vascular congestion with mild residual. No focal airspace disease, large pleural effusion, or pneumothorax. Chronic deformity of both shoulders. IMPRESSION: Cardiomegaly. Improved vascular congestion from exam last month with mild residual. Electronically Signed   By: Narda Rutherford [name]   On: 07/23/2018 21:40   Ct Angio Chest/abd/pel For Dissection W And/or Wo Contrast  Result Date: 07/24/2018 CLINICAL DATA:  84 year old with recurrent syncope. EXAM: CT ANGIOGRAPHY CHEST, ABDOMEN AND PELVIS TECHNIQUE: Multidetector CT imaging through the chest, abdomen and pelvis was performed using the standard protocol during bolus administration of intravenous contrast. Multiplanar reconstructed images and MIPs were obtained and reviewed to evaluate the vascular anatomy. CONTRAST:  ISOVUE-370 IOPAMIDOL (ISOVUE-370) INJECTION 76% COMPARISON:  Radiograph yesterday. Chest CT 06/15/2018. Chest abdomen pelvis CT 09/11/2015 FINDINGS: CTA CHEST FINDINGS Cardiovascular: Aortic tortuosity and atherosclerosis without dissection, acute aortic syndrome, aortic hematoma or aneurysm. Common origin of the brachiocephalic and left common carotid artery, normal variant arch configuration. Small volume thrombus in the right lower lobe pulmonary artery extending into  segmental branch consistent with acute pulmonary embolus, new from prior exam. Multi chamber cardiomegaly is unchanged from prior exam. No evidence of right heart strain. Small pericardial effusion with fluid adjacent to the right ventricle. Mediastinum/Nodes: No enlarged mediastinal or hilar lymph nodes. Heterogeneous thyroid gland with subcentimeter nodule in the left lobe. The esophagus is nondistended. Lungs/Pleura: Prior nodular opacity in the right upper lobe has resolved since prior exam, was likely infectious or inflammatory. Subpleural nodule in the right middle lobe image 54 series 8 is unchanged. Hypoventilatory change at the lung bases. No confluent airspace disease, pleural effusion or pulmonary edema. Musculoskeletal: Chronic osseous remottling of the shoulders, bilateral shoulder joint effusions. Multilevel degenerative change throughout the spine. Prominent Schmorl's node superior endplate of T4. Review of the MIP images confirms the above findings. CTA ABDOMEN AND PELVIS FINDINGS VASCULAR Aorta: Moderate atherosclerosis without dissection, evidence of vasculitis, aneurysm or  significant stenosis. Celiac: Mild atherosclerosis at the origin without stenosis or dissection. SMA: Mild plaque at the origin without significant stenosis or dissection. Mesenteric branches are patent. Renals: Patent without dissection or significant stenosis. IMA: Patent without dissection or stenosis. Inflow: Mild atherosclerotic calcification without dissection or stenosis. Veins: No obvious venous abnormality within the limitations of this arterial phase study. Review of the MIP images confirms the above findings. NON-VASCULAR Hepatobiliary: No focal lesion on arterial phase exam. Common bile duct dilatation with CBD measuring 14 mm at the porta hepatis, unchanged from MRCP last month. Interval cholecystectomy. No fluid collection in the gallbladder fossa. Pancreas: No ductal dilatation or inflammation. Spleen: Normal in  size without focal abnormality. Adrenals/Urinary Tract: No adrenal nodule. No hydronephrosis. Mild symmetric perinephric edema appears chronic. Simple cyst in the right kidney. Urinary bladder is distended, no bladder wall thickening. Stomach/Bowel: Colonic diverticulosis without diverticulitis. Moderate stool burden throughout the colon. No small bowel dilatation, inflammation, or obstruction. Stomach distended with ingested contents. Lymphatic: No abdominopelvic adenopathy. Reproductive: Status post hysterectomy. No adnexal masses. Other: No free air free fluid. Musculoskeletal: Chronic L1 compression fracture, upper most non-rib-bearing lumbar vertebra. Multilevel degenerative change in the lumbar spine. Anterolisthesis of L4 on L5 (numbering from superior most non-rib-bearing lumbar vertebra) is likely facet mediated. Peripherally sclerotic lucent lesion in the left proximal femur stable dating back to 09/11/2015 abdominal CT and considered benign. No acute osseous abnormalities. Review of the MIP images confirms the above findings. IMPRESSION: 1. Acute small volume pulmonary embolus in the right lower lobe, new from CT last month. 2. Diffuse thoracoabdominal aortic atherosclerosis without dissection or acute aortic abnormality. Chronic cardiomegaly. 3. Prior right upper lobe nodular opacity on chest CT has resolved, was likely infectious or inflammatory. 4. No acute findings in the abdomen or pelvis. Colonic diverticulosis without diverticulitis. 5. Recent cholecystectomy without complication. Biliary dilatation is chronic. Aortic Atherosclerosis (ICD10-I70.0). These results were called by telephone at the time of interpretation on 07/24/2018 at 1:18 am to PA [facility] , who verbally acknowledged these results. Electronically Signed   By: Narda Rutherford [name]   On: 07/24/2018 01:19    Scheduled Meds: . allopurinol  100 mg Oral QHS  . aspirin EC  81 mg Oral Daily  . calcium-vitamin D  1 tablet Oral Q  breakfast  . gabapentin  100 mg Oral QHS  . hydrALAZINE  25 mg Oral Q8H  . hydroxychloroquine  200 mg Oral BID  . Influenza vac split quadrivalent PF  0.5 mL Intramuscular Tomorrow-1000  . insulin aspart  0-5 Units Subcutaneous QHS  . insulin aspart  0-9 Units Subcutaneous TID WC  . isosorbide mononitrate  30 mg Oral QHS  . leflunomide  20 mg Oral Daily  . olopatadine  1 drop Both Eyes BID  . predniSONE  10 mg Oral Q breakfast  . rivaroxaban  15 mg Oral Q12H   Followed by  . [START ON 08/15/2018] rivaroxaban  20 mg Oral Q supper  . sacubitril-valsartan  1 tablet Oral Q1200  . sodium chloride flush  3 mL Intravenous Q12H  . sodium chloride flush  3 mL Intravenous Q12H   Continuous Infusions: . sodium chloride    . heparin 1,100 Units/hr (07/24/18 2229)     LOS: 1 day   Rickey Barbara, MD [facility] Pager On Amion  If 7PM-7AM, please contact night-coverage 07/25/2018, 4:35 PM

## 2018-07-25 NOTE — Evaluation (Signed)
Physical Therapy Evaluation Patient Details Name: [name] [name] MRN: [id number] DOB: October 03, 1930 Today's Date: 07/25/2018   History of Present Illness  [name] [name] is a 84 y.o. female with medical history significant for rheumatoid arthritis, chronic combined systolic and diastolic CHF, coronary artery disease, chronic kidney disease stage III, hypertension, type 2 diabetes mellitus, and cholecystectomy 1 month ago, presented with acute onset of lightheadedness and shoulder pain.   She was admitted for ongoing evaluation and management of acute pulmonary embolism with syncope and transient hypotension.  Clinical Impression  Patient presents with decreased mobility due to weakness from bedrest and multiple recent hosptializations.  Feel she will benefit from skilled PT in the acute setting to allow return home following STSNF rehab stay.      Follow Up Recommendations Supervision/Assistance - 24 hour;SNF    Equipment Recommendations  None recommended by PT    Recommendations for Other Services       Precautions / Restrictions Precautions Precautions: Fall      Mobility  Bed Mobility Overal bed mobility: Needs Assistance Bed Mobility: Supine to Sit     Supine to sit: HOB elevated;Supervision     General bed mobility comments: assist for safety  Transfers Overall transfer level: Needs assistance Equipment used: Rolling walker (2 wheeled) Transfers: Sit to/from Stand Sit to Stand: Min guard;Min assist         General transfer comment: assist for balance/safety, did not need lifting help  Ambulation/Gait Ambulation/Gait assistance: Min guard Gait Distance (Feet): 15 Feet(x 2) Assistive device: Rolling walker (2 wheeled) Gait Pattern/deviations: Step-through pattern;Decreased stride length;Shuffle;Wide base of support;Trunk flexed     General Gait Details: shuffling pattern with wide BOS and use of walker for balance/safety, limited to in room ambulation due to  weakness and needing to use the bathroom  Stairs            Wheelchair Mobility    Modified Rankin (Stroke Patients Only)       Balance Overall balance assessment: Needs assistance   Sitting balance-Leahy Scale: Good Sitting balance - Comments: no UE support for balance sitting EOB     Standing balance-Leahy Scale: Fair Standing balance comment: close S for safety while washing up perineal area occasional single hand supported                             Pertinent Vitals/Pain Pain Assessment: 0-10 Pain Score: 8  Pain Location: R shoulder with movement Pain Descriptors / Indicators: Grimacing;Guarding;Sharp Pain Intervention(s): Repositioned;Monitored during session;Limited activity within patient's tolerance;Heat applied    Home Living Family/patient expects to be discharged to:: Private residence Living Arrangements: Alone Available Help at Discharge: Personal care attendant;Family;Available PRN/intermittently Type of Home: Apartment(senior living apartment) Home Access: Level entry     Home Layout: One level Home Equipment: Hand held shower head;Walker - 4 wheels;Walker - 2 wheels;Cane - single point;Tub bench;Wheelchair - manual;Grab bars - toilet Additional Comments: Aide 7 days/week 9-12 and occasionally has family support overnight    Prior Function Level of Independence: Needs assistance   Gait / Transfers Assistance Needed: Uses rollator for functional mobility.   ADL's / Homemaking Assistance Needed: Aide assists with ADL, meals, and bed mobility.   Comments: Amb with rollator, Aide assists with bath, getting dressed, and laundry     Hand Dominance   Dominant Hand: Right    Extremity/Trunk Assessment   Upper Extremity Assessment Upper Extremity Assessment: RUE deficits/detail;LUE deficits/detail RUE Deficits /  Details: reports R shoulder is basically gone due to rhematoid; hand and wrist with deformities from RA RUE Sensation:  decreased light touch(some numbness from joint damage) LUE Deficits / Details: AAROM limited by pain to about 45, able to use hand for simple ADL    Lower Extremity Assessment Lower Extremity Assessment: Overall WFL for tasks assessed    Cervical / Trunk Assessment Cervical / Trunk Assessment: Kyphotic  Communication   Communication: No difficulties  Cognition Arousal/Alertness: Awake/alert Behavior During Therapy: WFL for tasks assessed/performed Overall Cognitive Status: Within Functional Limits for tasks assessed                                        General Comments General comments (skin integrity, edema, etc.): patient reports feeling unable to care for self in the evenings when no one is there; recently at [employer].  Toileted in bathroom and assisted with sponge bath and LB dressing    Exercises     Assessment/Plan    PT Assessment Patient needs continued PT services  PT Problem List Decreased strength;Decreased safety awareness;Decreased mobility;Decreased range of motion;Decreased coordination;Decreased knowledge of precautions;Decreased balance;Decreased knowledge of use of DME;Decreased activity tolerance       PT Treatment Interventions DME instruction;Therapeutic activities;Gait training;Therapeutic exercise;Balance training;Functional mobility training    PT Goals (Current goals can be found in the Care Plan section)  Acute Rehab PT Goals Patient Stated Goal: to go to rehab short term PT Goal Formulation: With patient Time For Goal Achievement: 08/08/18 Potential to Achieve Goals: Good    Frequency Min 2X/week   Barriers to discharge        Co-evaluation               AM-PAC PT "6 Clicks" Daily Activity  Outcome Measure Difficulty turning over in bed (including adjusting bedclothes, sheets and blankets)?: Unable Difficulty moving from lying on back to sitting on the side of the bed? : Unable Difficulty sitting down on and  standing up from a chair with arms (e.g., wheelchair, bedside commode, etc,.)?: Unable Help needed moving to and from a bed to chair (including a wheelchair)?: A Little Help needed walking in hospital room?: A Little Help needed climbing 3-5 steps with a railing? : A Lot 6 Click Score: 11    End of Session Equipment Utilized During Treatment: Gait belt Activity Tolerance: Patient limited by pain Patient left: with call bell/phone within reach;in chair   PT Visit Diagnosis: Other abnormalities of gait and mobility (R26.89)    Time: 1610-9604 PT Time Calculation (min) (ACUTE ONLY): 43 min   Charges:   PT Evaluation $PT Eval Moderate Complexity: 1 Mod PT Treatments $Gait Training: 8-22 mins $Therapeutic Activity: 8-22 mins        Sheran Lawless, PT Acute Rehabilitation Services [phone] 07/25/2018   Elray Mcgregor 07/25/2018, 3:48 PM

## 2018-07-25 NOTE — Progress Notes (Signed)
  Echocardiogram 2D Echocardiogram has been performed.  [name] [name] 07/25/2018, 10:56 AM 

## 2018-07-26 ENCOUNTER — Other Ambulatory Visit: Payer: Self-pay

## 2018-07-26 ENCOUNTER — Ambulatory Visit: Payer: Self-pay

## 2018-07-26 LAB — GLUCOSE, CAPILLARY
Glucose-Capillary: 105 mg/dL — ABNORMAL HIGH (ref 70–99)
Glucose-Capillary: 173 mg/dL — ABNORMAL HIGH (ref 70–99)
Glucose-Capillary: 117 mg/dL — ABNORMAL HIGH (ref 70–99)
Glucose-Capillary: 167 mg/dL — ABNORMAL HIGH (ref 70–99)
Glucose-Capillary: 137 mg/dL — ABNORMAL HIGH (ref 70–99)

## 2018-07-26 NOTE — Clinical Social Work Note (Signed)
Clinical Social Work Assessment  Patient Details  Name: [name] [name] MRN: [id number] Date of Birth: [birth year]  Date of referral:  07/26/18               Reason for consult:  Facility Placement, Discharge Planning                Permission sought to share information with:  Facility Sport and exercise psychologist, Family Supports Permission granted to share information::  Yes, Verbal Permission Granted  Name::     [name] [name]  Agency::     Relationship::  Daughter  Contact Information:  [phone]  Housing/Transportation Living arrangements for the past 2 months:  Apartment Source of Information:  Patient, Medical Team Patient Interpreter Needed:  None Criminal Activity/Legal Involvement Pertinent to Current Situation/Hospitalization:  No - Comment as needed Significant Relationships:  Adult Children, Other Family Members Lives with:  Self Do you feel safe going back to the place where you live?  Yes Need for family participation in patient care:  Yes (Comment)  Care giving concerns:  PT recommending SNF once medically stable for discharge.   Social Worker assessment / plan:  CSW met with patient. No supports at bedside. CSW introduced role and explained that PT recommendations would be discussed. Patient was at [facility] from 7/27-8/9 (13 days) and 8/19-9/3 (15 days). Patient is in her copay days and does not have secondary insurance to cover the estimated $160 per day it would cost to return to SNF. She would like CSW to discuss this with her daughter, [name] [name]. If patient's daughter thinks she can afford copay, she would like to return to [facility]. CSW called and left Ms. Hampton a Advertising account executive. No further concerns. CSW encouraged patient to contact CSW as needed. CSW will continue to follow patient for support and facilitate discharge to SNF, if agreeable, once medically stable.  Employment status:  Retired Nurse, adult PT Recommendations:   [place] / Referral to community resources:  [place]  Patient/Family's Response to care:  Patient wants CSW to discuss SNF with her daughter. [name] family supportive and involved in patient's care. Patient appreciated social work intervention.  Patient/Family's Understanding of and Emotional Response to Diagnosis, Current Treatment, and Prognosis:  Patient has a good understanding of the reason for admission and her potential need for rehab prior to returning home. Patient appears happy with hospital care.  Emotional Assessment Appearance:  Appears stated age Attitude/Demeanor/Rapport:  Engaged, Gracious Affect (typically observed):  Accepting, Appropriate, Calm, Pleasant Orientation:  Oriented to Self, Oriented to Place, Oriented to  Time, Oriented to Situation Alcohol / Substance use:  Never Used Psych involvement (Current and /or in the community):  No (Comment)  Discharge Needs  Concerns to be addressed:  Care Coordination Readmission within the last 30 days:  Yes Current discharge risk:  Dependent with Mobility, Lives alone Barriers to Discharge:  Continued Medical Work up, [place], LCSW 07/26/2018, 12:18 PM

## 2018-07-26 NOTE — NC FL2 (Signed)
[place] MEDICAID FL2 LEVEL OF CARE SCREENING TOOL     IDENTIFICATION  Patient Name: [name] [name] Birthdate: 1930/02/29 Sex: female Admission Date (Current Location): 07/23/2018  [facility] and [place] Number:  Producer, television/film/video and Address:  [facility] [place], [address], [place], [place] [address]      Provider Number: [id number]  Attending Physician Name and Address:  Jerald Kief, MD  Relative Name and Phone Number:       Current Level of Care: Hospital Recommended Level of Care: Skilled Nursing Facility Prior Approval Number:    Date Approved/Denied:   PASRR Number: [id number]  Discharge Plan: SNF    Current Diagnoses: Patient Active Problem List   Diagnosis Date Noted  . Acute pulmonary embolism (HCC) 07/24/2018  . Hypertension 06/18/2018  . Elevated troponin   . DCM (dilated cardiomyopathy) (HCC)   . Benign hypertensive heart and kidney disease with CHF and stage 3 chronic kidney disease (HCC) 05/28/2018  . Osteoarthritis 05/06/2018  . Adhesive capsulitis of right shoulder 10/12/2017  . Idiopathic chronic gout of multiple sites with tophus 08/25/2017  . Rheumatic nodule 06/28/2017  . History of total knee arthroplasty, bilateral 02/18/2017  . History of rotator cuff surgery 02/18/2017  . Obesity (BMI 30.0-34.9) 12/08/2016  . High risk medications (not anticoagulants) long-term use 09/21/2016  . Hammer toe 10/31/2015  . Chronic combined systolic and diastolic CHF (congestive heart failure) (HCC) 09/12/2015  . Midline low back pain without sciatica 06/27/2014  . Bilateral edema of lower extremity 06/05/2014  . CKD stage 3 due to type 2 diabetes mellitus (HCC) 06/05/2014  . DM (diabetes mellitus), type 2 with renal complications (HCC) 06/05/2014    Class: Chronic  . Anemia of chronic disease 05/01/2014  . Fluctuating blood pressure 02/06/2014  . Stroke (HCC) 01/17/2014  . Syncope 01/17/2014  . Rheumatoid arthritis involving  multiple sites (HCC) 12/19/2013    Class: Chronic  . CAD (coronary artery disease) 10/18/2013  . Acute blood loss as cause of postoperative anemia 08/09/2013  . Fibromyalgia 05/10/2013  . Reactive airway disease 04/19/2013  . Gout 04/19/2013  . Hyperlipidemia 04/27/2007  . Allergic rhinitis 04/27/2007  . Diverticulosis 04/27/2007    Orientation RESPIRATION BLADDER Height & Weight     Self, Time, Situation, Place  Normal Incontinent, External catheter(At times) Weight: 167 lb 14.4 oz (76.2 kg) Height:  5\' 4"  (162.6 cm)  BEHAVIORAL SYMPTOMS/MOOD NEUROLOGICAL BOWEL NUTRITION STATUS  (None) (None) Continent Diet(Heart healthy/carb modified.)  AMBULATORY STATUS COMMUNICATION OF NEEDS Skin   Limited Assist Verbally Other (Comment)(MASD.)                       Personal Care Assistance Level of Assistance              Functional Limitations Info  Sight, Hearing, Speech Sight Info: Adequate Hearing Info: Adequate Speech Info: Adequate    SPECIAL CARE FACTORS FREQUENCY  PT (By licensed PT), Blood pressure     PT Frequency: 5 x week              Contractures Contractures Info: Not present    Additional Factors Info  Code Status, Allergies Code Status Info: Full code Allergies Info: Clonidine Derivatives, Fish Allergy, Shellfish Allergy, Doxycycline, Indomethacin, Lyrica (Pregabalin), Methyldopa, Morphine And Related, Orange Fruit (Citrus), Strawberry (Diagnostic), Cetirizine Hcl, Codeine, Levaquin (Levofloxacin In D5w), Tomato           Current Medications (07/26/2018):  This is  the current hospital active medication list Current Facility-Administered Medications  Medication Dose Route Frequency Provider Last Rate Last Dose  . 0.9 %  sodium chloride infusion  250 mL Intravenous PRN Opyd, Lavone Neri, MD      . acetaminophen (TYLENOL) tablet 650 mg  650 mg Oral Q6H PRN Opyd, Lavone Neri, MD   650 mg at 07/24/18 0415   Or  . acetaminophen (TYLENOL) suppository 650  mg  650 mg Rectal Q6H PRN Opyd, Lavone Neri, MD      . albuterol (PROVENTIL) (2.5 MG/3ML) 0.083% nebulizer solution 2.5 mg  2.5 mg Inhalation Q6H PRN Opyd, Lavone Neri, MD      . allopurinol (ZYLOPRIM) tablet 100 mg  100 mg Oral QHS Opyd, Lavone Neri, MD   100 mg at 07/25/18 2226  . aspirin EC tablet 81 mg  81 mg Oral Daily Opyd, Lavone Neri, MD   81 mg at 07/26/18 0849  . bisacodyl (DULCOLAX) EC tablet 5 mg  5 mg Oral Daily PRN Opyd, Lavone Neri, MD      . calcium-vitamin D (OSCAL WITH D) 500-200 MG-UNIT per tablet 1 tablet  1 tablet Oral Q breakfast Opyd, Lavone Neri, MD   1 tablet at 07/26/18 0850  . carvedilol (COREG) tablet 3.125 mg  3.125 mg Oral BID WC Jerald Kief, MD   3.125 mg at 07/26/18 0618  . diclofenac sodium (VOLTAREN) 1 % transdermal gel 1 application  1 application Topical QID PRN Opyd, Lavone Neri, MD   1 application at 07/25/18 2222  . furosemide (LASIX) tablet 40 mg  40 mg Oral Daily Jerald Kief, MD   40 mg at 07/26/18 0850  . gabapentin (NEURONTIN) capsule 100 mg  100 mg Oral QHS Opyd, Lavone Neri, MD   100 mg at 07/25/18 2226  . hydrALAZINE (APRESOLINE) injection 5 mg  5 mg Intravenous Q4H PRN Jerald Kief, MD      . hydrALAZINE (APRESOLINE) tablet 25 mg  25 mg Oral Q8H Jerald Kief, MD   25 mg at 07/26/18 0618  . HYDROcodone-acetaminophen (NORCO/VICODIN) 5-325 MG per tablet 1 tablet  1 tablet Oral Q4H PRN Opyd, Lavone Neri, MD   1 tablet at 07/26/18 5284  . hydroxychloroquine (PLAQUENIL) tablet 200 mg  200 mg Oral BID Briscoe Deutscher, MD   200 mg at 07/26/18 0850  . hydroxypropyl methylcellulose / hypromellose (ISOPTO TEARS / GONIOVISC) 2.5 % ophthalmic solution 1 drop  1 drop Both Eyes QID PRN Opyd, Lavone Neri, MD      . Influenza vac split quadrivalent PF (FLUZONE HIGH-DOSE) injection 0.5 mL  0.5 mL Intramuscular Tomorrow-1000 Jerald Kief, MD      . insulin aspart (novoLOG) injection 0-5 Units  0-5 Units Subcutaneous QHS Opyd, Timothy S, MD      . insulin aspart (novoLOG)  injection 0-9 Units  0-9 Units Subcutaneous TID WC Opyd, Lavone Neri, MD   2 Units at 07/25/18 1809  . isosorbide mononitrate (IMDUR) 24 hr tablet 30 mg  30 mg Oral QHS Jerald Kief, MD   30 mg at 07/25/18 2236  . leflunomide (ARAVA) tablet 20 mg  20 mg Oral Daily Opyd, Lavone Neri, MD   20 mg at 07/26/18 0850  . morphine 4 MG/ML injection 3 mg  3 mg Intravenous Q4H PRN Opyd, Lavone Neri, MD      . olopatadine (PATANOL) 0.1 % ophthalmic solution 1 drop  1 drop Both Eyes BID Opyd, Lavone Neri, MD  1 drop at 07/26/18 0853  . ondansetron (ZOFRAN) tablet 4 mg  4 mg Oral Q6H PRN Opyd, Lavone Neri, MD       Or  . ondansetron (ZOFRAN) injection 4 mg  4 mg Intravenous Q6H PRN Opyd, Lavone Neri, MD      . predniSONE (DELTASONE) tablet 10 mg  10 mg Oral Q breakfast Opyd, Lavone Neri, MD   10 mg at 07/26/18 0849  . Rivaroxaban (XARELTO) tablet 15 mg  15 mg Oral Q12H Norva Pavlov, [place]   15 mg at 07/26/18 0618   Followed by  . [START ON 08/15/2018] rivaroxaban (XARELTO) tablet 20 mg  20 mg Oral Q supper Norva Pavlov, RPH      . sacubitril-valsartan (ENTRESTO) 49-51 mg per tablet  1 tablet Oral Q1200 Jerald Kief, MD   1 tablet at 07/25/18 1051  . senna-docusate (Senokot-S) tablet 1 tablet  1 tablet Oral QHS PRN Opyd, Lavone Neri, MD      . sodium chloride flush (NS) 0.9 % injection 3 mL  3 mL Intravenous Q12H Opyd, Lavone Neri, MD   3 mL at 07/26/18 0851  . sodium chloride flush (NS) 0.9 % injection 3 mL  3 mL Intravenous Q12H Opyd, Lavone Neri, MD   3 mL at 07/25/18 2225  . sodium chloride flush (NS) 0.9 % injection 3 mL  3 mL Intravenous PRN Opyd, Lavone Neri, MD         Discharge Medications: Please see discharge summary for a list of discharge medications.  Relevant Imaging Results:  Relevant Lab Results:   Additional Information SS#: [id number]  Margarito Liner, LCSW

## 2018-07-26 NOTE — Progress Notes (Signed)
PROGRESS NOTE    [name] [name]  VQM:[id number] DOB: 1930-05-26 DOA: 07/23/2018 PCP: Helane Rima, DO    Brief Narrative:  84 y.o. female with medical history significant for rheumatoid arthritis, chronic combined systolic and diastolic CHF, coronary artery disease, chronic kidney disease stage III, hypertension, type 2 diabetes mellitus, and cholecystectomy 1 month ago, now presenting to the emergency department for evaluation of acute onset of lightheadedness and shoulder pain.  Patient had a brief syncopal episode with hypotension upon arrival to the ED.  She reports that she had been experiencing some increased shoulder pain that she attributes to her rheumatoid arthritis, but no chest pain or shortness of breath.  She was seated and watching TV when she developed acute onset of lightheadedness.  She apparently reported a burning chest discomfort earlier, but denies that now.  She denies any recent fevers or chills and denies cough, chest pain, leg swelling, or leg tenderness.  She denies any history of easy bleeding, melena, or hematochezia.  ED Course: Upon arrival to the ED, patient is found to be saturating adequately on room air, mildly tachypneic, transiently hypotensive to 80/53.  EKG features a sinus rhythm with LVH and chest x-ray is notable for cardiomegaly with improved vascular congestion.  Noncontrast head CT is negative for acute intracranial abnormality.  CTA chest/abdomen/pelvis is notable for acute pulmonary embolism in the right lower lobe.  Chemistry panel features a creatinine 1.22, similar to priors.  CBC is unremarkable, troponin is at the borderline of elevation and BNP slightly elevated.  Patient was given 1.5 L of normal saline and morphine in the ED.  Blood pressure normalized with the IV fluids and patient has remained hemodynamically stable since that time  Assessment & Plan:   Principal Problem:   Acute pulmonary embolism (HCC) Active Problems:   CAD (coronary  artery disease)   Rheumatoid arthritis involving multiple sites (HCC)   Syncope   CKD stage 3 due to type 2 diabetes mellitus (HCC)   DM (diabetes mellitus), type 2 with renal complications (HCC)   Chronic combined systolic and diastolic CHF (congestive heart failure) (HCC)   Hypertension  1. Acute pulmonary embolism  - Presents with acute-onset of lightheadedness and had a brief syncopal episode with hypotension in ED  - ED workup notable for acute PE in RLL  - BP normalized with IVF, currently on minimal O2 support - Troponin minimally elevated, likely secondary to presenting PE -2d echo without R heart strain. EF improved over baseline -LE dopplers notable for R peroneal vein DVT -Transitioned off heparin, now on xarelto  2. Syncope  - Likely secondary to acute PE  - Presently stable at this time  3. Chronic combined systolic & diastolic CHF  - Appears euvolemic at present  - Of note, patient was given 1.5 liters NS in ED for syncope with hypotension  - Diuretics initially on hold at time of admission - Have since resumed diuretic and beta blocker  4. CAD - No anginal complaints  - Non-obstructive CAD on cath in July 2019  - will continue with ASA and nitrates - Patient presently stable.  5. Rheumatoid arthritis  - Patient continued on leflunomide, Plaquenil, and daily prednisone   - Continue as needed APAP and Norco  - Seen by PT. Patient weaker. Recommendations for SNF. Have consulted SW. Likely able to be discharged in the next 24-48hrs per SW  6. CKD stage III  - SCr is 1.22 on admission, clinically stable at this time -  Renally-dose medications   -Recheck bmet in AM  7. Type II DM  - A1c was 5.9% last month  - Managed at home with metformin, held on admission  - Will continue with SSI coverage  8. Hypertension  - Held Coreg in light of acute PE with syncope - Hydralazine was resumed, however patient became hypotensive with SBP in the 80's,  asymptomatic - Will hold further scheduled hydralazine. Continue on PRN IV hydralazine -  entresto continued with hold parameters  DVT prophylaxis: Heparin subQ Code Status: Full Family Communication: Pt in room, family at bedside Disposition Plan: SNF,likely in the next 24-48hrs  Consultants:     Procedures:     Antimicrobials: Anti-infectives (From admission, onward)   Start     Dose/Rate Route Frequency Ordered Stop   07/24/18 1000  hydroxychloroquine (PLAQUENIL) tablet 200 mg     200 mg Oral 2 times daily 07/24/18 0208        Subjective: Without complaints  Objective: Vitals:   07/25/18 2236 07/26/18 0340 07/26/18 1018 07/26/18 1334  BP: (!) 148/83 (!) 148/86 131/82 137/75  Pulse: 91 87 86   Resp:  18 16   Temp:  97.7 F (36.5 C) 97.6 F (36.4 C)   TempSrc:  Oral Oral   SpO2:  96% 95%   Weight:  76.2 kg    Height:        Intake/Output Summary (Last 24 hours) at 07/26/2018 1509 Last data filed at 07/26/2018 1500 Gross per 24 hour  Intake 1200 ml  Output 2500 ml  Net -1300 ml   Filed Weights   07/23/18 2103 07/24/18 0330 07/26/18 0340  Weight: 79 kg 81 kg 76.2 kg    Examination: General exam: Conversant, in no acute distress Respiratory system: normal chest rise, clear, no audible wheezing  Data Reviewed: I have personally reviewed following labs and imaging studies  CBC: Recent Labs  Lab 07/23/18 2100 07/23/18 2115 07/25/18 0808  WBC 9.6  --  9.6  NEUTROABS 5.1  --   --   HGB 11.7* 12.9 10.6*  HCT 37.4 38.0 32.8*  MCV 94.9  --  92.7  PLT 287  --  237   Basic Metabolic Panel: Recent Labs  Lab 07/23/18 2100 07/23/18 2115 07/24/18 0920 07/25/18 0808  NA 134* 137 136 141  K 4.5 4.5 4.0 3.6  CL 102 103 103 109  CO2 21*  --  21* 25  GLUCOSE 149* 149* 128* 109*  BUN 39* 57* 30* 23  CREATININE 1.22* 1.20* 0.97 1.00  CALCIUM 9.1  --  9.0 9.1   GFR: Estimated Creatinine Clearance: 40.4 mL/min (by C-G formula based on SCr of 1  mg/dL). Liver Function Tests: Recent Labs  Lab 07/23/18 2100  AST 23  ALT 8  ALKPHOS 61  BILITOT 0.8  PROT 6.1*  ALBUMIN 3.4*   Recent Labs  Lab 07/23/18 2100  LIPASE 50   No results for input(s): AMMONIA in the last 168 hours. Coagulation Profile: No results for input(s): INR, PROTIME in the last 168 hours. Cardiac Enzymes: Recent Labs  Lab 07/23/18 2100 07/24/18 0342 07/24/18 0920 07/24/18 1442  TROPONINI 0.03* 0.03* 0.03* <0.03   BNP (last 3 results) No results for input(s): PROBNP in the last 8760 hours. HbA1C: No results for input(s): HGBA1C in the last 72 hours. CBG: Recent Labs  Lab 07/25/18 1627 07/25/18 2132 07/26/18 0824 07/26/18 0903 07/26/18 1139  GLUCAP 151* 182* 105* 173* 117*   Lipid Profile: No results for  input(s): CHOL, HDL, LDLCALC, TRIG, CHOLHDL, LDLDIRECT in the last 72 hours. Thyroid Function Tests: No results for input(s): TSH, T4TOTAL, FREET4, T3FREE, THYROIDAB in the last 72 hours. Anemia Panel: No results for input(s): VITAMINB12, FOLATE, FERRITIN, TIBC, IRON, RETICCTPCT in the last 72 hours. Sepsis Labs: Recent Labs  Lab 07/23/18 2115  LATICACIDVEN 1.72    No results found for this or any previous visit (from the past 240 hour(s)).   Radiology Studies: No results found.  Scheduled Meds: . allopurinol  100 mg Oral QHS  . aspirin EC  81 mg Oral Daily  . calcium-vitamin D  1 tablet Oral Q breakfast  . carvedilol  3.125 mg Oral BID WC  . furosemide  40 mg Oral Daily  . gabapentin  100 mg Oral QHS  . hydrALAZINE  25 mg Oral Q8H  . hydroxychloroquine  200 mg Oral BID  . Influenza vac split quadrivalent PF  0.5 mL Intramuscular Tomorrow-1000  . insulin aspart  0-5 Units Subcutaneous QHS  . insulin aspart  0-9 Units Subcutaneous TID WC  . isosorbide mononitrate  30 mg Oral QHS  . leflunomide  20 mg Oral Daily  . olopatadine  1 drop Both Eyes BID  . predniSONE  10 mg Oral Q breakfast  . rivaroxaban  15 mg Oral Q12H    Followed by  . [START ON 08/15/2018] rivaroxaban  20 mg Oral Q supper  . sacubitril-valsartan  1 tablet Oral Q1200  . sodium chloride flush  3 mL Intravenous Q12H  . sodium chloride flush  3 mL Intravenous Q12H   Continuous Infusions: . sodium chloride       LOS: 2 days   Rickey Barbara, MD [facility] Pager On Amion  If 7PM-7AM, please contact night-coverage 07/26/2018, 3:09 PM

## 2018-07-26 NOTE — Plan of Care (Signed)
?  Problem: Activity: ?Goal: Capacity to carry out activities will improve ?Outcome: Progressing ?  ?

## 2018-07-26 NOTE — Clinical Social Work Note (Addendum)
Received call back from patient's daughter. She confirmed wishes to return to [facility]. She stated patient has Medicaid and [facility] has the information. [name] spoke with them yesterday. CSW notified admissions coordinator and sent referral. CSW emailed financial counselor asking her to confirm Medicaid and put it in system.  Charlynn Court, CSW [phone]  2:24 pm Financial counselor confirmed Medicaid and put it in system. [facility] starting English as a second language teacher.  Charlynn Court, CSW [phone]

## 2018-07-26 NOTE — Clinical Social Work Placement (Signed)
   CLINICAL SOCIAL WORK PLACEMENT  NOTE  Date:  07/26/2018  Patient Details  Name: [name] [name] MRN: [id number] Date of Birth: [birth year]  Clinical Social Work is seeking post-discharge placement for this patient at the Skilled  Nursing Facility level of care (*CSW will initial, date and re-position this form in  chart as items are completed):      Patient/family provided with [facility] Clinical Social Work Department's list of facilities offering this level of care within the geographic area requested by the patient (or if unable, by the patient's family).      Patient/family informed of their freedom to choose among providers that offer the needed level of care, that participate in Medicare, Medicaid or managed care program needed by the patient, have an available bed and are willing to accept the patient.      Patient/family informed of [facility]'s ownership interest in [facility] and [facility], as well as of the fact that they are under no obligation to receive care at these facilities.  PASRR submitted to EDS on 07/26/18     PASRR number received on       Existing PASRR number confirmed on 07/26/18     FL2 transmitted to all facilities in geographic area requested by pt/family on 07/26/18     FL2 transmitted to all facilities within larger geographic area on       Patient informed that his/her managed care company has contracts with or will negotiate with certain facilities, including the following:            Patient/family informed of bed offers received.  Patient chooses bed at       Physician recommends and patient chooses bed at      Patient to be transferred to   on  .  Patient to be transferred to facility by       Patient family notified on   of transfer.  Name of family member notified:        PHYSICIAN Please sign FL2     Additional Comment:    _______________________________________________ Margarito Liner, LCSW 07/26/2018, 1:06  PM

## 2018-07-26 NOTE — Patient Outreach (Signed)
[facility]) Care Management  07/26/2018  [name] [name] 15-Jul-1930 [id number]  [facility] has been following [name] for medication management and post discharge medication review after her last hospitalization.   Noted that [name] will go to SNF after discharge.    Plan: Close [facility] case.    Route case closure letter to PCP, Dr. Earlene Plater.  Berlin Hun, PharmD Clinical Pharmacist [facility] [phone]

## 2018-07-26 NOTE — Progress Notes (Signed)
The daughter of the patient saw me in the hallway and asked me to come into the room with the patient to answer questions concerning the AD. I read through the document with the patient and her daughter and answered their questions. I provided spiritual support by leading in prayer.    07/26/18 1030  Clinical Encounter Type  Visited With Patient and family together  Visit Type Follow-up;Spiritual support  Referral From Nurse  Consult/Referral To Chaplain  Spiritual Encounters  Spiritual Needs Literature;Prayer  Stress Factors  Patient Stress Factors Exhausted  Family Stress Factors None identified    Chaplain Dr Melvyn Novas

## 2018-07-27 ENCOUNTER — Other Ambulatory Visit: Payer: Self-pay | Admitting: *Deleted

## 2018-07-27 DIAGNOSIS — I824Z1 Acute embolism and thrombosis of unspecified deep veins of right distal lower extremity: Secondary | ICD-10-CM | POA: Diagnosis not present

## 2018-07-27 DIAGNOSIS — J810 Acute pulmonary edema: Secondary | ICD-10-CM | POA: Diagnosis not present

## 2018-07-27 DIAGNOSIS — E1122 Type 2 diabetes mellitus with diabetic chronic kidney disease: Secondary | ICD-10-CM | POA: Diagnosis not present

## 2018-07-27 DIAGNOSIS — R55 Syncope and collapse: Secondary | ICD-10-CM | POA: Diagnosis not present

## 2018-07-27 DIAGNOSIS — N183 Chronic kidney disease, stage 3 (moderate): Secondary | ICD-10-CM | POA: Diagnosis not present

## 2018-07-27 DIAGNOSIS — I2699 Other pulmonary embolism without acute cor pulmonale: Secondary | ICD-10-CM | POA: Diagnosis not present

## 2018-07-27 DIAGNOSIS — E1121 Type 2 diabetes mellitus with diabetic nephropathy: Secondary | ICD-10-CM

## 2018-07-27 DIAGNOSIS — M25511 Pain in right shoulder: Secondary | ICD-10-CM | POA: Diagnosis not present

## 2018-07-27 DIAGNOSIS — I10 Essential (primary) hypertension: Secondary | ICD-10-CM | POA: Diagnosis not present

## 2018-07-27 DIAGNOSIS — I129 Hypertensive chronic kidney disease with stage 1 through stage 4 chronic kidney disease, or unspecified chronic kidney disease: Secondary | ICD-10-CM | POA: Diagnosis not present

## 2018-07-27 DIAGNOSIS — K819 Cholecystitis, unspecified: Secondary | ICD-10-CM | POA: Diagnosis not present

## 2018-07-27 DIAGNOSIS — R262 Difficulty in walking, not elsewhere classified: Secondary | ICD-10-CM | POA: Diagnosis not present

## 2018-07-27 DIAGNOSIS — M0579 Rheumatoid arthritis with rheumatoid factor of multiple sites without organ or systems involvement: Secondary | ICD-10-CM | POA: Diagnosis not present

## 2018-07-27 DIAGNOSIS — M109 Gout, unspecified: Secondary | ICD-10-CM | POA: Diagnosis not present

## 2018-07-27 DIAGNOSIS — R488 Other symbolic dysfunctions: Secondary | ICD-10-CM | POA: Diagnosis not present

## 2018-07-27 DIAGNOSIS — I420 Dilated cardiomyopathy: Secondary | ICD-10-CM | POA: Diagnosis not present

## 2018-07-27 DIAGNOSIS — M25512 Pain in left shoulder: Secondary | ICD-10-CM | POA: Diagnosis not present

## 2018-07-27 DIAGNOSIS — I2609 Other pulmonary embolism with acute cor pulmonale: Secondary | ICD-10-CM | POA: Diagnosis not present

## 2018-07-27 DIAGNOSIS — I110 Hypertensive heart disease with heart failure: Secondary | ICD-10-CM | POA: Diagnosis not present

## 2018-07-27 DIAGNOSIS — M6281 Muscle weakness (generalized): Secondary | ICD-10-CM | POA: Diagnosis not present

## 2018-07-27 DIAGNOSIS — I5042 Chronic combined systolic (congestive) and diastolic (congestive) heart failure: Secondary | ICD-10-CM | POA: Diagnosis not present

## 2018-07-27 DIAGNOSIS — N179 Acute kidney failure, unspecified: Secondary | ICD-10-CM | POA: Diagnosis not present

## 2018-07-27 DIAGNOSIS — I160 Hypertensive urgency: Secondary | ICD-10-CM | POA: Diagnosis not present

## 2018-07-27 DIAGNOSIS — Z48815 Encounter for surgical aftercare following surgery on the digestive system: Secondary | ICD-10-CM | POA: Diagnosis not present

## 2018-07-27 DIAGNOSIS — D631 Anemia in chronic kidney disease: Secondary | ICD-10-CM | POA: Diagnosis not present

## 2018-07-27 DIAGNOSIS — M069 Rheumatoid arthritis, unspecified: Secondary | ICD-10-CM | POA: Diagnosis not present

## 2018-07-27 DIAGNOSIS — I2510 Atherosclerotic heart disease of native coronary artery without angina pectoris: Secondary | ICD-10-CM | POA: Diagnosis not present

## 2018-07-27 LAB — GLUCOSE, CAPILLARY
Glucose-Capillary: 104 mg/dL — ABNORMAL HIGH (ref 70–99)
Glucose-Capillary: 144 mg/dL — ABNORMAL HIGH (ref 70–99)

## 2018-07-27 MED ORDER — RIVAROXABAN (XARELTO) VTE STARTER PACK (15 & 20 MG): ORAL_TABLET | ORAL | 0 refills | Status: DC

## 2018-07-27 MED ORDER — HYPROMELLOSE (GONIOSCOPIC) 2.5 % OP SOLN: 1.0000 [drp] | Freq: Every day | OPHTHALMIC | Status: DC

## 2018-07-27 MED ORDER — HYDRALAZINE HCL 25 MG PO TABS: 25.0000 mg | ORAL_TABLET | Freq: Three times a day (TID) | ORAL | Status: DC

## 2018-07-27 MED ORDER — ISOSORBIDE MONONITRATE ER 30 MG PO TB24: 30.0000 mg | ORAL_TABLET | Freq: Every day | ORAL | Status: DC

## 2018-07-27 MED ORDER — POTASSIUM CHLORIDE CRYS ER 20 MEQ PO TBCR: 20.0000 meq | EXTENDED_RELEASE_TABLET | Freq: Two times a day (BID) | ORAL | Status: DC

## 2018-07-27 MED ORDER — CARVEDILOL 6.25 MG PO TABS: 6.2500 mg | ORAL_TABLET | Freq: Two times a day (BID) | ORAL | Status: DC

## 2018-07-27 MED ORDER — HYDROCODONE-ACETAMINOPHEN 5-325 MG PO TABS: 1.0000 | ORAL_TABLET | Freq: Two times a day (BID) | ORAL | 0 refills | Status: DC | PRN

## 2018-07-27 MED ORDER — OLOPATADINE HCL 0.2 % OP SOLN: 1.0000 [drp] | Freq: Every day | OPHTHALMIC | Status: DC

## 2018-07-27 NOTE — Clinical Social Work Note (Signed)
CSW facilitated patient discharge including contacting patient family and facility to confirm patient discharge plans. Clinical information faxed to facility and family agreeable with plan. Daughter will transport by car to [facility]. RN to call report prior to discharge [phone]).  CSW will sign off for now as social work intervention is no longer needed. Please consult [place] again if new needs arise.  Charlynn Court, CSW [phone]

## 2018-07-27 NOTE — Discharge Summary (Signed)
Physician Discharge Summary  [name] [name] TDV:[id number] DOB: 01-Feb-1930  PCP: Helane Rima, DO  Admit date: 07/23/2018 Discharge date: 07/27/2018  Recommendations for Outpatient Follow-up:  1. MD at SNF in 3 days with repeat labs (CBC & BMP). 2. Dr. Helane Rima, PCP upon discharge from SNF. 3. Dr. Chilton Si, Cardiology  Home Health: Patient is being discharged to [facility] Equipment/Devices: None  Discharge Condition: Improved and stable CODE STATUS: Full Diet recommendation: Heart healthy & diabetic diet.  Discharge Diagnoses:  Principal Problem:   Acute pulmonary embolism (HCC) Active Problems:   CAD (coronary artery disease)   Rheumatoid arthritis involving multiple sites (HCC)   Syncope   CKD stage 3 due to type 2 diabetes mellitus (HCC)   DM (diabetes mellitus), type 2 with renal complications (HCC)   Chronic combined systolic and diastolic CHF (congestive heart failure) (HCC)   Hypertension   Brief Summary: 84 year old female with PMH of rheumatoid arthritis on Arava, Plaquenil and chronic prednisone, chronic combined systolic and diastolic CHF, CAD, stage III chronic kidney disease, HTN, DM 2, cholecystectomy a month PTA, gout, HLD, seizures not on medications, stroke/TIA, who presented to the ED on 07/24/2018 for evaluation of acute onset of lightheadedness and shoulder pain.  She had a brief syncopal episode with hypotension upon arrival to the ED.  She reported that she had been experiencing some increased shoulder pain that she attributed to her rheumatoid arthritis but no chest pain or dyspnea.  She was seated and watching TV when she developed acute onset of lightheadedness.  She apparently reported a burning chest discomfort earlier but denied this in the ED.  Upon arrival to the ED, patient was not hypoxic but she was mildly tachypneic and transiently hypotensive to 80/53 mmHg.  EKG showed sinus rhythm with LVH and chest x-ray was notable for  cardiomegaly with improved vascular congestion.  CT head negative for acute abnormality.  CTA chest, abdomen and pelvis notable for acute pulmonary embolism in the right lower lobe.  Blood pressures normalized after 1.5 L of normal saline in ED.  She was admitted for further evaluation and management.   Assessment & Plan:   1.Acute pulmonary embolism/acute right peroneal vein DVT -Presented with acute-onset of lightheadedness and had a brief syncopal episode with hypotension in ED -ED workup notable for acute PE in RLL -BP normalized with IVF.  Currently not hypoxic. -Troponin minimally elevated, likely secondary to presenting PE - 2d echo without R heart strain. EF improved over baseline and detailed report as below. -LE dopplers notable for R peroneal vein DVT, detailed report as below. -Transitioned off heparin, now on xarelto -Duration of anticoagulation should be at least 6 months.  However since no clear provoking factors, may need further evaluation as outpatient, possibly a hematology consultation and may warrant indefinite anticoagulation.  Will defer decision of duration of anticoagulation to patient's PCP during outpatient follow-up.  2.Syncope -Unclear etiology.  Could have been due to transient hypotension which may have been related to her multiple cardiac medications and less likely related to PE which was only a small volume PE in the right lower lobe.  Her carvedilol and hydralazine does have been reduced. -Patient has been counseled that she should not drive for at least 6 months or until cleared by her physicians during outpatient follow-up.  She verbalized understanding.  3.Chronic combined systolic & diastolic CHF -Appears euvolemic at present -Of note, patient was given 1.5 liters NS in ED for syncope with hypotension -Continue prior  home dose of Lasix, Aldactone, Entresto and Imdur.  Carvedilol dose was reduced from 25mg  to 6.25 mg twice daily and  hydralazine from 100 mg to 25 mg 3 times daily due to initial presentation with hypotension.  May consider gradually titrating up these medications during outpatient follow-up.  4.CAD -No anginal complaints -Non-obstructive CAD on cath in July 2019 -will continue cardiac medications as above.  Due to initiation of Xarelto, discussed with patient's primary Cardiologist and agreed to discontinue aspirin 81 mg daily to minimize bleeding complications. - Patient presently stable.  5.Rheumatoid arthritis -Patient continued on leflunomide, Plaquenil, and daily prednisone -Continue as needed APAP and Norco  6.CKD stage III -SCr is 1.22 on admission, clinically stable at this time -Creatinine has normalized to 1.  Follow BMP periodically and closely as outpatient.  7.Type II DM -A1c was 5.9% last month -Managed at home with metformin, held on admission -Continue metformin at discharge.  Reasonable inpatient control on SSI alone.  8.Hypertension -Held Coreg in light of acute PE with syncope -Hydralazine was resumed, however patient became hypotensive with SBP in the 80's, asymptomatic -As stated above, carvedilol and hydralazine were resumed at lower dose.  Titrate as outpatient as blood pressure allows.  9.  Normocytic anemia:  Appears to be stable.  Follow CBC as outpatient.  Consultations:  None  Procedures:  TTE 07/25/2018: Study Conclusions  - Left ventricle: The cavity size was normal. There was mild   concentric hypertrophy. Systolic function was moderately reduced.   The estimated ejection fraction was in the range of 35% to 40%.   Diffuse hypokinesis. The study is not technically sufficient to   allow evaluation of LV diastolic function. - Aortic valve: Trileaflet; mildly thickened, mildly calcified   leaflets. There was mild regurgitation. - Mitral valve: Calcified annulus. - Left atrium: The atrium was severely  dilated.  Impressions:  - EF is mildly improved when compared to prior.  Bilateral lower extremity venous Dopplers 07/25/2018:  Final Interpretation: Right: Findings consistent with acute deep vein thrombosis involving the right peroneal vein. Left: There is no evidence of deep vein thrombosis in the lower extremity.   Discharge Instructions  Discharge Instructions    (HEART FAILURE PATIENTS) Call MD:  Anytime you have any of the following symptoms: 1) 3 pound weight gain in 24 hours or 5 pounds in 1 week 2) shortness of breath, with or without a dry hacking cough 3) swelling in the hands, feet or stomach 4) if you have to sleep on extra pillows at night in order to breathe.   Complete by:  As directed    Call MD for:  difficulty breathing, headache or visual disturbances   Complete by:  As directed    Call MD for:  extreme fatigue   Complete by:  As directed    Call MD for:  persistant dizziness or light-headedness   Complete by:  As directed    Call MD for:  severe uncontrolled pain   Complete by:  As directed    Diet - low sodium heart healthy   Complete by:  As directed    Diet Carb Modified   Complete by:  As directed    Driving Restrictions   Complete by:  As directed    No driving for 6 months or until cleared by your outpatient physicians to do so.   Increase activity slowly   Complete by:  As directed        Medication List    STOP  taking these medications   aspirin EC 81 MG tablet     TAKE these medications   albuterol 108 (90 Base) MCG/ACT inhaler Commonly known as:  PROVENTIL HFA;VENTOLIN HFA Inhale 1-2 puffs into the lungs every 6 (six) hours as needed for wheezing or shortness of breath.   allopurinol 100 MG tablet Commonly known as:  ZYLOPRIM Take 100 mg by mouth at bedtime.   calcium-vitamin D 500-200 MG-UNIT tablet Commonly known as:  OSCAL WITH D Take 1 tablet by mouth daily with breakfast.   carvedilol 6.25 MG tablet Commonly known as:   COREG Take 1 tablet (6.25 mg total) by mouth 2 (two) times daily with a meal. What changed:    medication strength  how much to take   D3-1000 1000 units capsule Generic drug:  Cholecalciferol Take 1,000 Units by mouth daily.   diclofenac sodium 1 % Gel Commonly known as:  VOLTAREN Apply 1 application topically daily. What changed:    when to take this  additional instructions   ENTRESTO 49-51 MG Generic drug:  sacubitril-valsartan Take 1 tablet by mouth daily at 12 noon.   ferrous sulfate 325 (65 FE) MG tablet Take 1 tablet (325 mg total) by mouth 2 (two) times daily with a meal.   furosemide 40 MG tablet Commonly known as:  LASIX Take 1 tablet (40 mg total) by mouth daily.   gabapentin 100 MG capsule Commonly known as:  NEURONTIN Take 1 capsule (100 mg total) by mouth at bedtime.   hydrALAZINE 25 MG tablet Commonly known as:  APRESOLINE Take 1 tablet (25 mg total) by mouth 3 (three) times daily. What changed:    medication strength  how much to take   HYDROcodone-acetaminophen 5-325 MG tablet Commonly known as:  NORCO/VICODIN Take 1 tablet by mouth every 12 (twelve) hours as needed for moderate pain. What changed:  when to take this   hydroxychloroquine 200 MG tablet Commonly known as:  PLAQUENIL Take 200 mg by mouth 2 (two) times daily.   hydroxypropyl methylcellulose / hypromellose 2.5 % ophthalmic solution Commonly known as:  ISOPTO TEARS / GONIOVISC Place 1 drop into both eyes daily.   isosorbide mononitrate 30 MG 24 hr tablet Commonly known as:  IMDUR Take 1 tablet (30 mg total) by mouth at bedtime.   leflunomide 20 MG tablet Commonly known as:  ARAVA Take 20 mg by mouth daily.   metFORMIN 500 MG tablet Commonly known as:  GLUCOPHAGE Take 1 tablet (500 mg total) by mouth daily with breakfast.   Olopatadine HCl 0.2 % Soln Place 1 drop into both eyes daily.   potassium chloride SA 20 MEQ tablet Commonly known as:  K-DUR,KLOR-CON Take 1  tablet (20 mEq total) by mouth 2 (two) times daily.   predniSONE 10 MG tablet Commonly known as:  DELTASONE Take 1 tablet (10 mg total) by mouth daily with breakfast.   Rivaroxaban 15 & 20 MG Tbpk Take as directed on package: Start with one 15mg  tablet by mouth twice a day with food. On Day 22, switch to one 20mg  tablet once a day with food. PHARMACY: Please discard the first 4 tablets from the starter pack which she has received in the hospital.   spironolactone 25 MG tablet Commonly known as:  ALDACTONE Take 12.5 mg by mouth daily.   triamcinolone cream 0.1 % Commonly known as:  KENALOG Apply 1 application topically 2 (two) times daily as needed (for itching- to affected sites).   vitamin C 500 MG tablet  Commonly known as:  ASCORBIC ACID Take 500 mg by mouth daily.       Contact information for follow-up providers    Helane Rima, DO. Schedule an appointment as soon as possible for a visit.   Specialty:  Family Medicine Why:  Upon discharge from SNF. Contact information: [address] [place] [address] [phone]        Chilton Si, MD .   Specialty:  Cardiology Contact information: [address] [place] [address] [place] [address] [phone]        MD at SNF. Schedule an appointment as soon as possible for a visit in 3 day(s).   Why:  To be seen with repeat labs (CBC & BMP).           Contact information for after-discharge care    Destination    [facility] Preferred SNF .   Service:  Skilled Nursing Contact information: [address] [place] [address] [phone]                 Allergies  Allergen Reactions  . Clonidine Derivatives Swelling    [name]  . Fish Allergy Diarrhea, Swelling and Other (See Comments)    Turns skin "black," but can tolerate white fish Salmon- Diarrhea  . Shellfish Allergy Hives  .  Doxycycline Rash  . Indomethacin Other (See Comments)    Reaction not recalled by the patient  . Lyrica [Pregabalin] Other (See Comments)    Hallucinations   . Methyldopa Other (See Comments)    Aldomet (for hypertension): Reaction not recalled by the patient  . Morphine And Related Other (See Comments)    Family reports it drops her bp that she needs iv fluids  . Orange Fruit [Citrus] Other (See Comments)    Indigestion/heartburn  . Strawberry (Diagnostic) Itching  . Cetirizine Hcl Itching and Rash  . Codeine Itching  . Levaquin [Levofloxacin In D5w] Rash  . Tomato Rash      Procedures/Studies: Ct Head Wo Contrast  Result Date: 07/23/2018 CLINICAL DATA:  Patient with generalized weakness. EXAM: CT HEAD WITHOUT CONTRAST TECHNIQUE: Contiguous axial images were obtained from the base of the skull through the vertex without intravenous contrast. COMPARISON:  Brain CT 06/24/2018. FINDINGS: Brain: Ventricles and sulci are appropriate for patient's age. Periventricular and subcortical white matter hypodensity compatible with chronic microvascular ischemic changes. No evidence for acute cortically based infarct, intracranial hemorrhage, mass lesion or mass-effect. Vascular: Unremarkable Skull: Intact. Sinuses/Orbits: Unremarkable orbits. Paranasal sinuses are well aerated. Mastoid air cells are unremarkable. Other: None. IMPRESSION: No acute intracranial process. Electronically Signed   By: Annia Belt [name]   On: 07/23/2018 22:23   Dg Chest Port 1 View  Result Date: 07/23/2018 CLINICAL DATA:  Weakness and diaphoresis EXAM: PORTABLE CHEST 1 VIEW COMPARISON:  Chest radiograph 06/22/2018 FINDINGS: Cardiomegaly is unchanged. Improved vascular congestion with mild residual. No focal airspace disease, large pleural effusion, or pneumothorax. Chronic deformity of both shoulders. IMPRESSION: Cardiomegaly. Improved vascular congestion from exam last month with mild residual. Electronically Signed   By:  Narda Rutherford [name]   On: 07/23/2018 21:40   Ct Angio Chest/abd/pel For Dissection W And/or Wo Contrast  Result Date: 07/24/2018 CLINICAL DATA:  84 year old with recurrent syncope. EXAM: CT ANGIOGRAPHY CHEST, ABDOMEN AND PELVIS TECHNIQUE: Multidetector CT imaging through the chest, abdomen and pelvis was performed using the standard protocol during bolus administration of intravenous contrast. Multiplanar reconstructed  images and MIPs were obtained and reviewed to evaluate the vascular anatomy. CONTRAST:  ISOVUE-370 IOPAMIDOL (ISOVUE-370) INJECTION 76% COMPARISON:  Radiograph yesterday. Chest CT 06/15/2018. Chest abdomen pelvis CT 09/11/2015 FINDINGS: CTA CHEST FINDINGS Cardiovascular: Aortic tortuosity and atherosclerosis without dissection, acute aortic syndrome, aortic hematoma or aneurysm. Common origin of the brachiocephalic and left common carotid artery, normal variant arch configuration. Small volume thrombus in the right lower lobe pulmonary artery extending into segmental branch consistent with acute pulmonary embolus, new from prior exam. Multi chamber cardiomegaly is unchanged from prior exam. No evidence of right heart strain. Small pericardial effusion with fluid adjacent to the right ventricle. Mediastinum/Nodes: No enlarged mediastinal or hilar lymph nodes. Heterogeneous thyroid gland with subcentimeter nodule in the left lobe. The esophagus is nondistended. Lungs/Pleura: Prior nodular opacity in the right upper lobe has resolved since prior exam, was likely infectious or inflammatory. Subpleural nodule in the right middle lobe image 54 series 8 is unchanged. Hypoventilatory change at the lung bases. No confluent airspace disease, pleural effusion or pulmonary edema. Musculoskeletal: Chronic osseous remottling of the shoulders, bilateral shoulder joint effusions. Multilevel degenerative change throughout the spine. Prominent Schmorl's node superior endplate of T4. Review of the MIP images  confirms the above findings. CTA ABDOMEN AND PELVIS FINDINGS VASCULAR Aorta: Moderate atherosclerosis without dissection, evidence of vasculitis, aneurysm or significant stenosis. Celiac: Mild atherosclerosis at the origin without stenosis or dissection. SMA: Mild plaque at the origin without significant stenosis or dissection. Mesenteric branches are patent. Renals: Patent without dissection or significant stenosis. IMA: Patent without dissection or stenosis. Inflow: Mild atherosclerotic calcification without dissection or stenosis. Veins: No obvious venous abnormality within the limitations of this arterial phase study. Review of the MIP images confirms the above findings. NON-VASCULAR Hepatobiliary: No focal lesion on arterial phase exam. Common bile duct dilatation with CBD measuring 14 mm at the porta hepatis, unchanged from MRCP last month. Interval cholecystectomy. No fluid collection in the gallbladder fossa. Pancreas: No ductal dilatation or inflammation. Spleen: Normal in size without focal abnormality. Adrenals/Urinary Tract: No adrenal nodule. No hydronephrosis. Mild symmetric perinephric edema appears chronic. Simple cyst in the right kidney. Urinary bladder is distended, no bladder wall thickening. Stomach/Bowel: Colonic diverticulosis without diverticulitis. Moderate stool burden throughout the colon. No small bowel dilatation, inflammation, or obstruction. Stomach distended with ingested contents. Lymphatic: No abdominopelvic adenopathy. Reproductive: Status post hysterectomy. No adnexal masses. Other: No free air free fluid. Musculoskeletal: Chronic L1 compression fracture, upper most non-rib-bearing lumbar vertebra. Multilevel degenerative change in the lumbar spine. Anterolisthesis of L4 on L5 (numbering from superior most non-rib-bearing lumbar vertebra) is likely facet mediated. Peripherally sclerotic lucent lesion in the left proximal femur stable dating back to 09/11/2015 abdominal CT and  considered benign. No acute osseous abnormalities. Review of the MIP images confirms the above findings. IMPRESSION: 1. Acute small volume pulmonary embolus in the right lower lobe, new from CT last month. 2. Diffuse thoracoabdominal aortic atherosclerosis without dissection or acute aortic abnormality. Chronic cardiomegaly. 3. Prior right upper lobe nodular opacity on chest CT has resolved, was likely infectious or inflammatory. 4. No acute findings in the abdomen or pelvis. Colonic diverticulosis without diverticulitis. 5. Recent cholecystectomy without complication. Biliary dilatation is chronic. Aortic Atherosclerosis (ICD10-I70.0). These results were called by telephone at the time of interpretation on 07/24/2018 at 1:18 am to [facility] , who verbally acknowledged these results. Electronically Signed   By: Narda Rutherford [name]   On: 07/24/2018 01:19      Subjective:  Patient denies complaints.  No chest pain, dyspnea, palpitations, dizziness or lightheadedness.  No bleeding reported.  Discharge Exam:  Vitals:   07/26/18 1947 07/27/18 0423 07/27/18 0803 07/27/18 1303  BP: (!) 151/87 (!) 150/83 (!) 162/83 134/68  Pulse: 90 86 88 88  Resp: 18 18  20   Temp: 97.7 F (36.5 C) (!) 97.4 F (36.3 C)  98.2 F (36.8 C)  TempSrc: Oral Oral  Oral  SpO2: 100% 100%  100%  Weight:  77.3 kg    Height:        General: Pleasant elderly female, moderately built and overweight, sitting up comfortably in bed without distress. Cardiovascular: S1 & S2 heard, RRR. No murmurs, rubs, gallops or clicks. No JVD or pedal edema. Respiratory: Clear to auscultation without wheezing, rhonchi or crackles. No increased work of breathing. Abdominal:  Non distended, non tender & soft. No organomegaly or masses appreciated. Normal bowel sounds heard. CNS: Alert and oriented. No focal deficits. Extremities: no edema, no cyanosis    The results of significant diagnostics from this hospitalization (including  imaging, microbiology, ancillary and laboratory) are listed below for reference.      Labs: CBC: Recent Labs  Lab 07/23/18 2100 07/23/18 2115 07/25/18 0808  WBC 9.6  --  9.6  NEUTROABS 5.1  --   --   HGB 11.7* 12.9 10.6*  HCT 37.4 38.0 32.8*  MCV 94.9  --  92.7  PLT 287  --  237   Basic Metabolic Panel: Recent Labs  Lab 07/23/18 2100 07/23/18 2115 07/24/18 0920 07/25/18 0808  NA 134* 137 136 141  K 4.5 4.5 4.0 3.6  CL 102 103 103 109  CO2 21*  --  21* 25  GLUCOSE 149* 149* 128* 109*  BUN 39* 57* 30* 23  CREATININE 1.22* 1.20* 0.97 1.00  CALCIUM 9.1  --  9.0 9.1   Liver Function Tests: Recent Labs  Lab 07/23/18 2100  AST 23  ALT 8  ALKPHOS 61  BILITOT 0.8  PROT 6.1*  ALBUMIN 3.4*   BNP (last 3 results) Recent Labs    05/28/18 1614 06/22/18 2153 07/23/18 2100  BNP 2,077.6* 1,146.7* 138.3*   Cardiac Enzymes: Recent Labs  Lab 07/23/18 2100 07/24/18 0342 07/24/18 0920 07/24/18 1442  TROPONINI 0.03* 0.03* 0.03* <0.03   CBG: Recent Labs  Lab 07/26/18 1139 07/26/18 1713 07/26/18 2103 07/27/18 0738 07/27/18 1131  GLUCAP 117* 167* 137* 104* 144*   Discussed in detail with patient's daughter at bedside.  Updated care and answered questions.    Time coordinating discharge: 40 minutes  SIGNED:  Marcellus Scott, MD, FACP, [facility]. [facility] Pager [phone]  If 7PM-7AM, please contact night-coverage [email or web address] Password [facility] 07/27/2018, 3:09 PM

## 2018-07-27 NOTE — Clinical Social Work Note (Addendum)
Insurance authorization still pending.  Charlynn Court, CSW [phone]  12:16 pm Authorization approved. SNF needs discharge summary by 4:00. CSW sent message to MD to notify.  Charlynn Court, CSW [phone]

## 2018-07-27 NOTE — Progress Notes (Addendum)
Patient report given to nurse [name] at [facility]. [name] [name]   1612:  Patient discharged with all her belongings, discharge instructions, and prescriptions.  Daughter here to provide transport. [name] Maxwell, RN

## 2018-07-27 NOTE — Clinical Social Work Placement (Signed)
   CLINICAL SOCIAL WORK PLACEMENT  NOTE  Date:  07/27/2018  Patient Details  Name: [name] [name] MRN: [id number] Date of Birth: [birth year]  Clinical Social Work is seeking post-discharge placement for this patient at the Skilled  Nursing Facility level of care (*CSW will initial, date and re-position this form in  chart as items are completed):      Patient/family provided with [facility] Clinical Social Work Department's list of facilities offering this level of care within the geographic area requested by the patient (or if unable, by the patient's family).      Patient/family informed of their freedom to choose among providers that offer the needed level of care, that participate in Medicare, Medicaid or managed care program needed by the patient, have an available bed and are willing to accept the patient.      Patient/family informed of [facility]'s ownership interest in [facility] and [facility], as well as of the fact that they are under no obligation to receive care at these facilities.  PASRR submitted to EDS on 07/26/18     PASRR number received on       Existing PASRR number confirmed on 07/26/18     FL2 transmitted to all facilities in geographic area requested by pt/family on 07/26/18     FL2 transmitted to all facilities within larger geographic area on       Patient informed that his/her managed care company has contracts with or will negotiate with certain facilities, including the following:        Yes   Patient/family informed of bed offers received.  Patient chooses bed at [facility]     Physician recommends and patient chooses bed at      Patient to be transferred to [facility] on 07/27/18.  Patient to be transferred to facility by Daughter Para March will transport by car.     Patient family notified on 07/27/18 of transfer.  Name of family member notified:  [name]     PHYSICIAN Please prepare  prescriptions     Additional Comment:    _______________________________________________ Margarito Liner, LCSW 07/27/2018, 3:12 PM

## 2018-07-27 NOTE — Patient Outreach (Signed)
[facility]) Care Management  07/27/2018  ESRAA SERES 07-23-1930 [id number]    Case closures as pt discharged from the hospital to SNF.   Elliot Cousin, RN Care Management Coordinator [facility] Main Office [phone]

## 2018-07-28 ENCOUNTER — Non-Acute Institutional Stay (SKILLED_NURSING_FACILITY): Payer: Medicare Other | Admitting: Internal Medicine

## 2018-07-28 ENCOUNTER — Ambulatory Visit: Payer: Self-pay | Admitting: *Deleted

## 2018-07-28 ENCOUNTER — Encounter: Payer: Self-pay | Admitting: Internal Medicine

## 2018-07-28 DIAGNOSIS — I2510 Atherosclerotic heart disease of native coronary artery without angina pectoris: Secondary | ICD-10-CM | POA: Diagnosis not present

## 2018-07-28 DIAGNOSIS — I110 Hypertensive heart disease with heart failure: Secondary | ICD-10-CM

## 2018-07-28 DIAGNOSIS — I2609 Other pulmonary embolism with acute cor pulmonale: Secondary | ICD-10-CM

## 2018-07-28 DIAGNOSIS — M0579 Rheumatoid arthritis with rheumatoid factor of multiple sites without organ or systems involvement: Secondary | ICD-10-CM | POA: Diagnosis not present

## 2018-07-28 DIAGNOSIS — E1121 Type 2 diabetes mellitus with diabetic nephropathy: Secondary | ICD-10-CM

## 2018-07-28 DIAGNOSIS — I824Z1 Acute embolism and thrombosis of unspecified deep veins of right distal lower extremity: Secondary | ICD-10-CM

## 2018-07-28 NOTE — Progress Notes (Signed)
:   Location:  [facility] Room Number: [phone] Place of Service:  SNF (31)  Randon Goldsmith. Lyn Hollingshead, MD  Patient Care Team: Helane Rima, DO as PCP - General (Family Medicine) Chilton Si, MD as PCP - Cardiology (Cardiology)  Extended Emergency Contact Information Primary Emergency Contact: Hampton,Jeannette Address: [address]          [place], [place] [address] [place] of [place] Home Phone: [phone] Relation: Daughter Secondary Emergency Contact: [name] Address: [address]          [place], [place] [address] [place] of [place] Mobile Phone: [phone] Relation: Daughter     Allergies: Clonidine derivatives; Fish allergy; Shellfish allergy; Doxycycline; Indomethacin; Lyrica [pregabalin]; Methyldopa; Morphine and related; Orange fruit [citrus]; Strawberry (diagnostic); Cetirizine hcl; Codeine; Levaquin [levofloxacin in d5w]; and Tomato  Chief Complaint  Patient presents with  . Readmit To SNF    Admit to [facility]    HPI: Patient is 84 y.o. female with history of rheumatoid arthritis on Arava, Plaquenil and chronic prednisone, chronic combined systolic and diastolic congestive heart failure, coronary artery disease, stage III chronic kidney disease, hypertension, diabetes mellitus 2, cholecystectomy month prior, gout, hyperlipidemia, seizures not on medications, stroke/TIA who presented to ED on 9/15 2019 for evaluation of acute onset of lightheadedness and left shoulder pain.  She had a brief syncopal episode with hypotension upon arrival to the ED.  She reported she been experiencing some increased shoulder pain that she attributed to her rheumatoid arthritis but no chest pain or shortness of breath.  Upon arrival to the ED patient was not hypoxic but mildly could tachypneic and transiently hypotensive to 80/53.  CTA chest abdomen pelvis was notable for PE in the right lower lobe.  Blood pressures normalized after  1.5 L of normal saline in the ED.  Patient was admitted to [facility] from 9/14-18 where she was treated for acute lower lobe PE.  Patient was discovered to have an acute right peroneal vein DVT, and patient was treated with heparin initially and then Xarelto, coagulation for 6 months recommended.  Secondary to hypotension patient's Coreg and hydralazine were resumed but at a lower dose to be tried treated as an outpatient as needed.  Patient is admitted to skilled nursing facility for OT/PT.  While at skilled nursing facility patient will be followed for diabetes mellitus type 2 treated with metformin, rheumatoid arthritis treated with Plaquenil prednisone and Arava, and coronary artery disease now treated with Xarelto and Coreg.  Past Medical History:  Diagnosis Date  . Allergic rhinitis due to pollen 04/27/2007  . Arthritis    "in q joint" (08/07/2013)  . CHF (congestive heart failure) (HCC)   . Coronary atherosclerosis of native coronary artery   . Cough   . Disorder of bone and cartilage, unspecified   . Edema 05/03/2013  . Exertional shortness of breath    "sometimes" (08/07/2013)  . First degree atrioventricular block   . Gout, unspecified 04/19/2013  . Hyperlipidemia 04/27/2007  . Hypertension   . Muscle spasm   . Muscle weakness (generalized)   . Myocardial infarction (HCC) ~ 1970  . Onychia and paronychia of toe   . Osteoarthrosis, unspecified whether generalized or localized, unspecified site 04/27/2007  . Other fall   . Other malaise and fatigue   . Pneumonia 05/2018  . Prepatellar bursitis   . Seizures (HCC)   . Stroke (HCC) 01/17/2014  . TIA (transient ischemic attack)    "  a few one summer" (08/07/2013)  . Type II diabetes mellitus (HCC)    "fasting 90-110s" (08/07/2013)  . Unspecified essential hypertension 08/08/2013  . Unspecified vitamin D deficiency     Past Surgical History:  Procedure Laterality Date  . ABDOMINAL HYSTERECTOMY  04/1980  . APPENDECTOMY   1960  . CARDIAC CATHETERIZATION  2003  . CATARACT EXTRACTION W/ INTRAOCULAR LENS  IMPLANT, BILATERAL Bilateral ~ 2012  . CHOLECYSTECTOMY N/A 06/26/2018   Procedure: LAPAROSCOPIC CHOLECYSTECTOMY POSSIBLE INTRAOPERATIVE CHOLANGIOGRAM;  Surgeon: Abigail Miyamoto, MD;  Location: [facility] OR;  Service: General;  Laterality: N/A;  . JOINT REPLACEMENT    . REPLACEMENT TOTAL KNEE Right 09/2005  . RIGHT/LEFT HEART CATH AND CORONARY ANGIOGRAPHY N/A 06/01/2018   Procedure: RIGHT/LEFT HEART CATH AND CORONARY ANGIOGRAPHY;  Surgeon: Corky Crafts, MD;  Location: [facility] INVASIVE CV LAB;  Service: Cardiovascular;  Laterality: N/A;  . SHOULDER OPEN ROTATOR CUFF REPAIR Right 07/1999  . TONSILLECTOMY  08/1974  . TOTAL KNEE ARTHROPLASTY Left 08/07/2013   Procedure: TOTAL KNEE ARTHROPLASTY- LEFT;  Surgeon: Dannielle Huh, MD;  Location: [facility] OR;  Service: Orthopedics;  Laterality: Left;  . TRANSTHORACIC ECHOCARDIOGRAM  2003   EF 55-65%; mild concentric LVH    Allergies as of 07/28/2018      Reactions   Clonidine Derivatives Swelling   [name]   Fish Allergy Diarrhea, Swelling, Other (See Comments)   Turns skin "black," but can tolerate white fish Salmon- Diarrhea   Shellfish Allergy Hives   Doxycycline Rash   Indomethacin Other (See Comments)   Reaction not recalled by the patient   Lyrica [pregabalin] Other (See Comments)   Hallucinations   Methyldopa Other (See Comments)   Aldomet (for hypertension): Reaction not recalled by the patient   Morphine And Related Other (See Comments)   Family reports it drops her bp that she needs iv fluids   Orange Fruit [citrus] Other (See Comments)   Indigestion/heartburn   Strawberry (diagnostic) Itching   Cetirizine Hcl Itching, Rash   Codeine Itching   Levaquin [levofloxacin In D5w] Rash   Tomato Rash      Medication List        Accurate as of 07/28/18 12:08 PM. Always use your most recent med  list.          albuterol 108 (90 Base) MCG/ACT inhaler Commonly known as:  PROVENTIL HFA;VENTOLIN HFA Inhale 1-2 puffs into the lungs every 6 (six) hours as needed for wheezing or shortness of breath.   allopurinol 100 MG tablet Commonly known as:  ZYLOPRIM Take 100 mg by mouth at bedtime.   calcium-vitamin D 500-200 MG-UNIT tablet Commonly known as:  OSCAL WITH D Take 1 tablet by mouth daily with breakfast.   carvedilol 6.25 MG tablet Commonly known as:  COREG Take 1 tablet (6.25 mg total) by mouth 2 (two) times daily with a meal.   D3-1000 1000 units capsule Generic drug:  Cholecalciferol Take 1,000 Units by mouth daily.   diclofenac sodium 1 % Gel Commonly known as:  VOLTAREN Apply 1 application topically daily.   ENTRESTO 49-51 MG Generic drug:  sacubitril-valsartan Take 1 tablet by mouth daily at 12 noon.   ferrous sulfate 325 (65 FE) MG tablet Take 1 tablet (325 mg total) by mouth 2 (two) times daily with a meal.   furosemide 40 MG tablet Commonly known as:  LASIX Take 1 tablet (40 mg total) by mouth daily.   gabapentin 100 MG capsule  Commonly known as:  NEURONTIN Take 1 capsule (100 mg total) by mouth at bedtime.   hydrALAZINE 25 MG tablet Commonly known as:  APRESOLINE Take 1 tablet (25 mg total) by mouth 3 (three) times daily.   HYDROcodone-acetaminophen 5-325 MG tablet Commonly known as:  NORCO/VICODIN Take 1 tablet by mouth every 12 (twelve) hours as needed for moderate pain.   hydroxychloroquine 200 MG tablet Commonly known as:  PLAQUENIL Take 200 mg by mouth 2 (two) times daily.   hydroxypropyl methylcellulose / hypromellose 2.5 % ophthalmic solution Commonly known as:  ISOPTO TEARS / GONIOVISC Place 1 drop into both eyes daily.   isosorbide mononitrate 30 MG 24 hr tablet Commonly known as:  IMDUR Take 1 tablet (30 mg total) by mouth at bedtime.   leflunomide 20 MG tablet Commonly known as:  ARAVA Take 20 mg by mouth daily.   metFORMIN  500 MG tablet Commonly known as:  GLUCOPHAGE Take 1 tablet (500 mg total) by mouth daily with breakfast.   Olopatadine HCl 0.2 % Soln Place 1 drop into both eyes daily.   potassium chloride SA 20 MEQ tablet Commonly known as:  K-DUR,KLOR-CON Take 1 tablet (20 mEq total) by mouth 2 (two) times daily.   predniSONE 10 MG tablet Commonly known as:  DELTASONE Take 1 tablet (10 mg total) by mouth daily with breakfast.   Rivaroxaban 15 & 20 MG Tbpk Take as directed on package: Start with one 15mg  tablet by mouth twice a day with food. On Day 22, switch to one 20mg  tablet once a day with food. PHARMACY: Please discard the first 4 tablets from the starter pack which she has received in the hospital.   spironolactone 25 MG tablet Commonly known as:  ALDACTONE Take 12.5 mg by mouth daily.   triamcinolone cream 0.1 % Commonly known as:  KENALOG Apply 1 application topically 2 (two) times daily as needed (for itching- to affected sites).   vitamin C 500 MG tablet Commonly known as:  ASCORBIC ACID Take 500 mg by mouth daily.       No orders of the defined types were placed in this encounter.   Immunization History  Administered Date(s) Administered  . Influenza Whole 09/06/2012  . Influenza, High Dose Seasonal PF 09/13/2017  . Influenza,inj,Quad PF,6+ Mos 09/12/2013, 09/11/2014, 07/23/2015, 09/16/2016  . Pneumococcal Polysaccharide-23 04/13/2005, 11/09/2009, 09/12/2015  . Td 04/13/2005  . Zoster 11/10/2011    Social History   Tobacco Use  . Smoking status: Never Smoker  . Smokeless tobacco: Never Used  Substance Use Topics  . Alcohol use: No    Alcohol/week: 0.0 standard drinks    Family history is   Family History  Problem Relation Age of Onset  . Heart attack Father       Review of Systems  DATA OBTAINED: from patient, nurse GENERAL:  no fevers, fatigue, appetite changes SKIN: No itching, or rash EYES: No eye pain, redness, discharge EARS: No earache,  tinnitus, change in hearing NOSE: No congestion, drainage or bleeding  MOUTH/THROAT: No mouth or tooth pain, No sore throat RESPIRATORY: No cough, wheezing, SOB CARDIAC: No chest pain, palpitations, lower extremity edema  GI: No abdominal pain, No N/V/D or constipation, No heartburn or reflux  GU: No dysuria, frequency or urgency, or incontinence  MUSCULOSKELETAL: No unrelieved bone/joint pain NEUROLOGIC: No headache, dizziness or focal weakness PSYCHIATRIC: No c/o anxiety or sadness   Vitals:   07/28/18 1207  BP: (!) 162/90  Pulse: 91  Resp: 18  Temp: (!) 97.4 F (36.3 C)    SpO2 Readings from Last 1 Encounters:  07/27/18 100%   Body mass index is 29.73 kg/m.     Physical Exam  GENERAL APPEARANCE: Alert, conversant,  No acute distress.  SKIN: No diaphoresis rash HEAD: Normocephalic, atraumatic  EYES: Conjunctiva/lids clear. Pupils round, reactive. EOMs intact.  EARS: External exam WNL, canals clear. Hearing grossly normal.  NOSE: No deformity or discharge.  MOUTH/THROAT: Lips w/o lesions  RESPIRATORY: Breathing is even, unlabored. Lung sounds are clear   CARDIOVASCULAR: Heart RRR no murmurs, rubs or gallops. No peripheral edema.   GASTROINTESTINAL: Abdomen is soft, non-tender, not distended w/ normal bowel sounds. GENITOURINARY: Bladder non tender, not distended  MUSCULOSKELETAL: No abnormal joints or musculature NEUROLOGIC:  Cranial nerves 2-12 grossly intact. Moves all extremities  PSYCHIATRIC: Mood and affect appropriate to situation, no behavioral issues  Patient Active Problem List   Diagnosis Date Noted  . Acute pulmonary embolism (HCC) 07/24/2018  . Hypertension 06/18/2018  . Elevated troponin   . DCM (dilated cardiomyopathy) (HCC)   . Benign hypertensive heart and kidney disease with CHF and stage 3 chronic kidney disease (HCC) 05/28/2018  . Osteoarthritis 05/06/2018  . Adhesive capsulitis of right shoulder 10/12/2017  . Idiopathic chronic gout of  multiple sites with tophus 08/25/2017  . Rheumatic nodule 06/28/2017  . History of total knee arthroplasty, bilateral 02/18/2017  . History of rotator cuff surgery 02/18/2017  . Obesity (BMI 30.0-34.9) 12/08/2016  . High risk medications (not anticoagulants) long-term use 09/21/2016  . Hammer toe 10/31/2015  . Chronic combined systolic and diastolic CHF (congestive heart failure) (HCC) 09/12/2015  . Midline low back pain without sciatica 06/27/2014  . Bilateral edema of lower extremity 06/05/2014  . CKD stage 3 due to type 2 diabetes mellitus (HCC) 06/05/2014  . DM (diabetes mellitus), type 2 with renal complications (HCC) 06/05/2014    Class: Chronic  . Anemia of chronic disease 05/01/2014  . Fluctuating blood pressure 02/06/2014  . Stroke (HCC) 01/17/2014  . Syncope 01/17/2014  . Rheumatoid arthritis involving multiple sites (HCC) 12/19/2013    Class: Chronic  . CAD (coronary artery disease) 10/18/2013  . Acute blood loss as cause of postoperative anemia 08/09/2013  . Fibromyalgia 05/10/2013  . Reactive airway disease 04/19/2013  . Gout 04/19/2013  . Hyperlipidemia 04/27/2007  . Allergic rhinitis 04/27/2007  . Diverticulosis 04/27/2007      Labs reviewed: Basic Metabolic Panel:    Component Value Date/Time   NA 141 07/25/2018 0808   NA 142 07/04/2018   K 3.6 07/25/2018 0808   CL 109 07/25/2018 0808   CO2 25 07/25/2018 0808   GLUCOSE 109 (H) 07/25/2018 0808   BUN 23 07/25/2018 0808   BUN 40 (A) 07/04/2018   CREATININE 1.00 07/25/2018 0808   CREATININE 1.19 (H) 07/21/2017 1454   CALCIUM 9.1 07/25/2018 0808   PROT 6.1 (L) 07/23/2018 2100   PROT 7.2 09/09/2015 1210   ALBUMIN 3.4 (L) 07/23/2018 2100   ALBUMIN 3.9 09/09/2015 1210   AST 23 07/23/2018 2100   ALT 8 07/23/2018 2100   ALKPHOS 61 07/23/2018 2100   BILITOT 0.8 07/23/2018 2100   BILITOT 0.5 09/09/2015 1210   GFRNONAA 50 (L) 07/25/2018 0808   GFRNONAA 42 (L) 07/21/2017 1454   GFRAA 57 (L) 07/25/2018 0808     GFRAA 48 (L) 07/21/2017 1454    Recent Labs    11/28/17 0809  05/29/18 0030  05/30/18 0449  06/01/18 0459  06/27/18 0630  07/23/18 2100 07/23/18 2115 07/24/18 0920 07/25/18 0808  NA 134*   < >  --    < > 143   < > 141  140   < > 140   < > 134* 137 136 141  K 4.5   < >  --    < > 4.1   < > 3.5  3.5   < > 3.5   < > 4.5 4.5 4.0 3.6  CL 107   < >  --    < > 108   < > 108  107   < > 104  --  102 103 103 109  CO2 21*   < >  --    < > 25   < > 26  27   < > 26  --  21*  --  21* 25  GLUCOSE 132*   < >  --    < > 94   < > 109*  110*   < > 108*  --  149* 149* 128* 109*  BUN 33*   < >  --    < > 24*   < > 22  21   < > 17   < > 39* 57* 30* 23  CREATININE 1.05*   < >  --    < > 1.31*   < > 1.00  0.98   < > 1.10*   < > 1.22* 1.20* 0.97 1.00  CALCIUM 8.7*   < >  --    < > 8.7*   < > 8.9  8.9   < > 8.7*  --  9.1  --  9.0 9.1  MG 2.7*  --  1.7  --  1.7  --   --   --   --   --   --   --   --   --   PHOS  --   --   --   --   --   --  2.7  --  2.7  --   --   --   --   --    < > = values in this interval not displayed.   Liver Function Tests: Recent Labs    06/25/18 0607 06/26/18 0445 06/27/18 0630 07/23/18 2100  AST 27 24  --  23  ALT 104* 80*  --  8  ALKPHOS 184* 161*  --  61  BILITOT 0.9 0.9  --  0.8  PROT 6.6 6.5  --  6.1*  ALBUMIN 3.0* 3.0* 2.9* 3.4*   Recent Labs    06/22/18 2153 06/23/18 0500 07/23/18 2100  LIPASE 150* 77* 50   No results for input(s): AMMONIA in the last 8760 hours. CBC: Recent Labs    06/26/18 0445 06/27/18 0630  07/12/18 07/23/18 2100 07/23/18 2115 07/25/18 0808  WBC 8.0 11.9*   < > 6.8 9.6  --  9.6  NEUTROABS 6.2 8.9*  --   --  5.1  --   --   HGB 11.1* 10.5*   < > 11.2* 11.7* 12.9 10.6*  HCT 34.2* 32.7*   < > 34* 37.4 38.0 32.8*  MCV 89.5 92.1  --   --  94.9  --  92.7  PLT 263 237   < > 289 287  --  237   < > = values in this interval not displayed.   Lipid Recent Labs    05/29/18 0030 06/24/18 0524 06/25/18 0607  CHOL  178 189  202*  HDL 73 63 63  LDLCALC 92 113* 126*  TRIG 64 65 67    Cardiac Enzymes: Recent Labs    07/24/18 0342 07/24/18 0920 07/24/18 1442  TROPONINI 0.03* 0.03* <0.03   BNP: Recent Labs    05/28/18 1614 06/22/18 2153 07/23/18 2100  BNP 2,077.6* 1,146.7* 138.3*   No results found for: [facility] Lab Results  Component Value Date   HGBA1C 5.9 (H) 06/25/2018   Lab Results  Component Value Date   TSH 1.742 06/23/2018   Lab Results  Component Value Date   VITAMINB12 612 06/08/2018   Lab Results  Component Value Date   FOLATE >19.9 07/03/2014   Lab Results  Component Value Date   IRON 19 06/08/2018   TIBC 183 06/08/2018   FERRITIN 184.10 06/08/2018    Imaging and Procedures obtained prior to SNF admission: Ct Head Wo Contrast  Result Date: 07/23/2018 CLINICAL DATA:  Patient with generalized weakness. EXAM: CT HEAD WITHOUT CONTRAST TECHNIQUE: Contiguous axial images were obtained from the base of the skull through the vertex without intravenous contrast. COMPARISON:  Brain CT 06/24/2018. FINDINGS: Brain: Ventricles and sulci are appropriate for patient's age. Periventricular and subcortical white matter hypodensity compatible with chronic microvascular ischemic changes. No evidence for acute cortically based infarct, intracranial hemorrhage, mass lesion or mass-effect. Vascular: Unremarkable Skull: Intact. Sinuses/Orbits: Unremarkable orbits. Paranasal sinuses are well aerated. Mastoid air cells are unremarkable. Other: None. IMPRESSION: No acute intracranial process. Electronically Signed   By: Annia Belt [name]   On: 07/23/2018 22:23   Dg Chest Port 1 View  Result Date: 07/23/2018 CLINICAL DATA:  Weakness and diaphoresis EXAM: PORTABLE CHEST 1 VIEW COMPARISON:  Chest radiograph 06/22/2018 FINDINGS: Cardiomegaly is unchanged. Improved vascular congestion with mild residual. No focal airspace disease, large pleural effusion, or pneumothorax. Chronic deformity of both  shoulders. IMPRESSION: Cardiomegaly. Improved vascular congestion from exam last month with mild residual. Electronically Signed   By: Narda Rutherford [name]   On: 07/23/2018 21:40   Ct Angio Chest/abd/pel For Dissection W And/or Wo Contrast  Result Date: 07/24/2018 CLINICAL DATA:  84 year old with recurrent syncope. EXAM: CT ANGIOGRAPHY CHEST, ABDOMEN AND PELVIS TECHNIQUE: Multidetector CT imaging through the chest, abdomen and pelvis was performed using the standard protocol during bolus administration of intravenous contrast. Multiplanar reconstructed images and MIPs were obtained and reviewed to evaluate the vascular anatomy. CONTRAST:  ISOVUE-370 IOPAMIDOL (ISOVUE-370) INJECTION 76% COMPARISON:  Radiograph yesterday. Chest CT 06/15/2018. Chest abdomen pelvis CT 09/11/2015 FINDINGS: CTA CHEST FINDINGS Cardiovascular: Aortic tortuosity and atherosclerosis without dissection, acute aortic syndrome, aortic hematoma or aneurysm. Common origin of the brachiocephalic and left common carotid artery, normal variant arch configuration. Small volume thrombus in the right lower lobe pulmonary artery extending into segmental branch consistent with acute pulmonary embolus, new from prior exam. Multi chamber cardiomegaly is unchanged from prior exam. No evidence of right heart strain. Small pericardial effusion with fluid adjacent to the right ventricle. Mediastinum/Nodes: No enlarged mediastinal or hilar lymph nodes. Heterogeneous thyroid gland with subcentimeter nodule in the left lobe. The esophagus is nondistended. Lungs/Pleura: Prior nodular opacity in the right upper lobe has resolved since prior exam, was likely infectious or inflammatory. Subpleural nodule in the right middle lobe image 54 series 8 is unchanged. Hypoventilatory change at the lung bases. No confluent airspace disease, pleural effusion or pulmonary edema. Musculoskeletal: Chronic osseous remottling of the shoulders, bilateral shoulder joint  effusions. Multilevel degenerative change throughout the spine. Prominent Schmorl's node superior  endplate of T4. Review of the MIP images confirms the above findings. CTA ABDOMEN AND PELVIS FINDINGS VASCULAR Aorta: Moderate atherosclerosis without dissection, evidence of vasculitis, aneurysm or significant stenosis. Celiac: Mild atherosclerosis at the origin without stenosis or dissection. SMA: Mild plaque at the origin without significant stenosis or dissection. Mesenteric branches are patent. Renals: Patent without dissection or significant stenosis. IMA: Patent without dissection or stenosis. Inflow: Mild atherosclerotic calcification without dissection or stenosis. Veins: No obvious venous abnormality within the limitations of this arterial phase study. Review of the MIP images confirms the above findings. NON-VASCULAR Hepatobiliary: No focal lesion on arterial phase exam. Common bile duct dilatation with CBD measuring 14 mm at the porta hepatis, unchanged from MRCP last month. Interval cholecystectomy. No fluid collection in the gallbladder fossa. Pancreas: No ductal dilatation or inflammation. Spleen: Normal in size without focal abnormality. Adrenals/Urinary Tract: No adrenal nodule. No hydronephrosis. Mild symmetric perinephric edema appears chronic. Simple cyst in the right kidney. Urinary bladder is distended, no bladder wall thickening. Stomach/Bowel: Colonic diverticulosis without diverticulitis. Moderate stool burden throughout the colon. No small bowel dilatation, inflammation, or obstruction. Stomach distended with ingested contents. Lymphatic: No abdominopelvic adenopathy. Reproductive: Status post hysterectomy. No adnexal masses. Other: No free air free fluid. Musculoskeletal: Chronic L1 compression fracture, upper most non-rib-bearing lumbar vertebra. Multilevel degenerative change in the lumbar spine. Anterolisthesis of L4 on L5 (numbering from superior most non-rib-bearing lumbar vertebra) is  likely facet mediated. Peripherally sclerotic lucent lesion in the left proximal femur stable dating back to 09/11/2015 abdominal CT and considered benign. No acute osseous abnormalities. Review of the MIP images confirms the above findings. IMPRESSION: 1. Acute small volume pulmonary embolus in the right lower lobe, new from CT last month. 2. Diffuse thoracoabdominal aortic atherosclerosis without dissection or acute aortic abnormality. Chronic cardiomegaly. 3. Prior right upper lobe nodular opacity on chest CT has resolved, was likely infectious or inflammatory. 4. No acute findings in the abdomen or pelvis. Colonic diverticulosis without diverticulitis. 5. Recent cholecystectomy without complication. Biliary dilatation is chronic. Aortic Atherosclerosis (ICD10-I70.0). These results were called by telephone at the time of interpretation on 07/24/2018 at 1:18 am to PA [facility] , who verbally acknowledged these results. Electronically Signed   By: Narda Rutherford [name]   On: 07/24/2018 01:19     Not all labs, radiology exams or other studies done during hospitalization come through on my EPIC note; however they are reviewed by me.    Assessment and Plan  Right lower lobe PE/acute right peroneal vein DVT-treated initially with heparin and then transition to Xarelto; recommended anticoagulation should be of 6 months duration; 2D echo without right heart strain, EF improved over baseline SNF -continue Xarelto 15 mg twice daily for 21 days then 20 mg daily  Hypertension- blood pressure was on the low side so Coreg was held and hydralazine and Coreg were reduced prior to discharge SNF -blood pressure is elevated on admission but it is a nephrologist one reading; will monitor and adjust blood pressure meds accordingly  Rheumatoid arthritis SNF -no complaints; continue Plaquenil 200 mg p.o. twice daily, Arava 20 mg daily and prednisone 10 mg daily  Diabetes mellitus type 2-A1c was 5.9 last month SNF  -continue metformin 500 mg every morning  Coronary artery disease SNF -patient without chest pain; continue Coreg at a lower dose now of 6.25 mg twice daily  and Xarelto now instead of aspirin; and Imdur 30 mg nightly      Time spent greater than 35  minutes;> 50% of time with patient was spent reviewing records, labs, tests and studies, counseling and developing plan of care  Noah Delaine. Sheppard Coil, MD

## 2018-07-30 ENCOUNTER — Encounter: Payer: Self-pay | Admitting: Internal Medicine

## 2018-07-30 DIAGNOSIS — I824Z1 Acute embolism and thrombosis of unspecified deep veins of right distal lower extremity: Secondary | ICD-10-CM | POA: Insufficient documentation

## 2018-07-30 DIAGNOSIS — I110 Hypertensive heart disease with heart failure: Secondary | ICD-10-CM | POA: Insufficient documentation

## 2018-08-01 ENCOUNTER — Other Ambulatory Visit: Payer: Self-pay

## 2018-08-01 LAB — CBC AND DIFFERENTIAL
HCT: 36 (ref 36–46)
Hemoglobin: 11.3 — AB (ref 12.0–16.0)
Platelets: 303 (ref 150–399)
WBC: 9.3

## 2018-08-01 LAB — BASIC METABOLIC PANEL
BUN: 33 — AB (ref 4–21)
Creatinine: 1.4 — AB (ref 0.5–1.1)
Glucose: 90
Potassium: 5 (ref 3.4–5.3)
Sodium: 141 (ref 137–147)

## 2018-08-02 ENCOUNTER — Other Ambulatory Visit: Payer: Self-pay | Admitting: Licensed Clinical Social Worker

## 2018-08-02 NOTE — Patient Outreach (Signed)
[facility]) Care Management  [facility] Social Work  08/02/2018  [name] [name] 09/09/1930 [id number]  Encounter Medications:  Outpatient Encounter Medications as of 08/02/2018  Medication Sig  . albuterol (PROVENTIL HFA;VENTOLIN HFA) 108 (90 Base) MCG/ACT inhaler Inhale 1-2 puffs into the lungs every 6 (six) hours as needed for wheezing or shortness of breath.   . allopurinol (ZYLOPRIM) 100 MG tablet Take 100 mg by mouth at bedtime.   . calcium-vitamin D (OSCAL WITH D) 500-200 MG-UNIT tablet Take 1 tablet by mouth daily with breakfast.  . carvedilol (COREG) 6.25 MG tablet Take 1 tablet (6.25 mg total) by mouth 2 (two) times daily with a meal.  . D3-1000 1000 units capsule Take 1,000 Units by mouth daily.  . diclofenac sodium (VOLTAREN) 1 % GEL Apply 1 application topically daily. (Patient taking differently: Apply 1 application topically See admin instructions. Apply to painful areas of shoulders and arms daily as directed)  . ferrous sulfate 325 (65 FE) MG tablet Take 1 tablet (325 mg total) by mouth 2 (two) times daily with a meal.  . furosemide (LASIX) 40 MG tablet Take 1 tablet (40 mg total) by mouth daily.  Marland Kitchen gabapentin (NEURONTIN) 100 MG capsule Take 1 capsule (100 mg total) by mouth at bedtime.  . hydrALAZINE (APRESOLINE) 25 MG tablet Take 1 tablet (25 mg total) by mouth 3 (three) times daily.  Marland Kitchen HYDROcodone-acetaminophen (NORCO/VICODIN) 5-325 MG tablet Take 1 tablet by mouth every 12 (twelve) hours as needed for moderate pain.  . hydroxychloroquine (PLAQUENIL) 200 MG tablet Take 200 mg by mouth 2 (two) times daily.  . hydroxypropyl methylcellulose / hypromellose (ISOPTO TEARS / GONIOVISC) 2.5 % ophthalmic solution Place 1 drop into both eyes daily.  . isosorbide mononitrate (IMDUR) 30 MG 24 hr tablet Take 1 tablet (30 mg total) by mouth at bedtime.  Marland Kitchen leflunomide (ARAVA) 20 MG tablet Take 20 mg by mouth daily.  . metFORMIN (GLUCOPHAGE) 500 MG tablet Take 1 tablet (500  mg total) by mouth daily with breakfast.  . Olopatadine HCl 0.2 % SOLN Place 1 drop into both eyes daily.  . potassium chloride SA (K-DUR,KLOR-CON) 20 MEQ tablet Take 1 tablet (20 mEq total) by mouth 2 (two) times daily.  . predniSONE (DELTASONE) 10 MG tablet Take 1 tablet (10 mg total) by mouth daily with breakfast.  . Rivaroxaban 15 & 20 MG TBPK Take as directed on package: Start with one 12m tablet by mouth twice a day with food. On Day 22, switch to one 236mtablet once a day with food. PHARMACY: Please discard the first 4 tablets from the starter pack which she has received in the hospital.  . sacubitril-valsartan (ENTRESTO) 49-51 MG Take 1 tablet by mouth daily at 12 noon.   . Marland Kitchenpironolactone (ALDACTONE) 25 MG tablet Take 12.5 mg by mouth daily.  . Marland Kitchenriamcinolone cream (KENALOG) 0.1 % Apply 1 application topically 2 (two) times daily as needed (for itching- to affected sites).   . vitamin C (ASCORBIC ACID) 500 MG tablet Take 500 mg by mouth daily.   No facility-administered encounter medications on file as of 08/02/2018.     Functional Status:  In your present state of health, do you have any difficulty performing the following activities: 07/24/2018 06/23/2018  Hearing? N N  Vision? N N  Difficulty concentrating or making decisions? N Y  Walking or climbing stairs? Y Y  Dressing or bathing? Y Y  Doing errands, shopping? Y Y  Some recent data might be  hidden    Fall/Depression Screening:  PHQ 2/9 Scores 06/28/2017 03/29/2017 03/04/2016 07/03/2015 11/27/2014 09/26/2014 07/11/2013  PHQ - 2 Score 0 0 0 0 0 0 0  PHQ- 9 Score - - - - - - -    Assessment: [facility] CSW received new referral on 08/01/18 with request to follow patient during her SNF stay at [facility]. [facility] CSW is familiar with both patient and family and recently worked with patient during her [facility] stay last month. [facility] CSW arrived at [facility] and successfully met with patient. Per chart , patient has a medical history  significant for rheumatoid arthritis, chronic combined systolic and diastolic CHF, coronary artery disease, chronic kidney disease stage III, hypertension, type 2 diabetes mellitus, and cholecystectomy 1 month ago, and presented to the emergency department for evaluation of acute onset of lightheadedness and shoulder pain on 07/24/18.  Patient had a brief syncopal episode with hypotension upon arrival to the ED.  She reports that she had been experiencing some increased shoulder pain that she attributes to her rheumatoid arthritis, but no chest pain or shortness of breath. Patient provided HIPPA verifications successfully. Per patient, she had a blood clot in her lung which lead to her most recent hospitalization. Patient reports completing advance directives during that hospitalization as well. Patient reports that she still wishes to return home post SNF discharge but feels that she will need more support within the home. Patient has a very strong support network of family members. Patient already has an aide in place 3 hours per day during the week and 2 hours per day during the weekends. However, family is currently working on finding an additional aide as they feel as patient needs to have someone with her 24/7 and [facility] CSW agrees that this would be very beneficial if doable. Patient reports that PT has been very impressed with her progress since starting therapy last week. [facility] CSW completed call to patient's daughter but was unable to reach her. HIPPA compliant voice message left encouraging a return call once available.   Plan: [facility] CSW will follow up within one week and will await for return call from family.  Eula Fried, BSW, MSW, [email or web address] Phone: [phone] Fax: [phone]

## 2018-08-04 ENCOUNTER — Other Ambulatory Visit: Payer: Self-pay | Admitting: Licensed Clinical Social Worker

## 2018-08-04 NOTE — Patient Outreach (Signed)
[facility]) Care Management  08/04/2018  MARQUETTA WEISKOPF [birth year] [id number]  [facility] CSW participated in [facility] CM Multidisciplinary Discussion on 08/04/18 and successfully presented patient's case and received suggestions. Suggestions were to introduce family to [facility] with [facility]. [facility] CSW will print information and provide to patient and family during next [facility] Consult.  [name], BSW, MSW, LCSW [email or web address] Phone: [phone] Fax: [phone]

## 2018-08-08 LAB — BASIC METABOLIC PANEL
BUN: 43 — AB (ref 4–21)
Creatinine: 1.4 — AB (ref 0.5–1.1)
Glucose: 108
Potassium: 5.3 (ref 3.4–5.3)
Sodium: 142 (ref 137–147)

## 2018-08-09 ENCOUNTER — Other Ambulatory Visit: Payer: Self-pay | Admitting: Licensed Clinical Social Worker

## 2018-08-09 NOTE — Patient Outreach (Signed)
[facility]) Care Management  08/09/2018  [name] [name] 1930/06/20 [id number]  [facility] CSW arrived at [facility] on 08/09/18 to complete joint [facility] Consult visit with [facility] Social Work Intern [name]. Patient is a 84 year old female with PMH of rheumatoid arthritis on Arava, Plaquenil and chronic prednisone, chronic combined systolic and diastolic CHF, CAD, stage III chronic kidney disease, HTN, DM 2, cholecystectomy a month PTA, gout, HLD, seizures not on medications, stroke/TIA, who presented to the ED on 07/24/2018 for evaluation of acute onset of lightheadedness and shoulder pain.  She had a brief syncopal episode with hypotension upon arrival to the ED. Patient was in her room, sitting in her wheelchair when social workers arrived. Patient agreeable to intern being present during [facility] Consult. Patient reports to be feeling very drowsy these past few days. Patient shares that her family signed her out of the facility over the weekend and she was able to go to her daughter and son in law's joint birthday celebration. Patient reports that she is using her walker at all times when walking. [facility] CSW questioned if family members had found a personal care service provider as family wanted patient to have 24/7 care post SNF discharge back home. Patient states that she is not sure if her daughter has found anyone yet but that they are actively working on this. Patient continues to want to live in the community independently for as long as possible. [facility] CSW will follow up with patient within two weeks. No set discharge date at this time per SNF social worker.  [name], BSW, MSW, LCSW [employer] [name] Phone: [phone] Fax: [phone]

## 2018-08-15 ENCOUNTER — Non-Acute Institutional Stay (SKILLED_NURSING_FACILITY): Payer: Medicare Other | Admitting: Internal Medicine

## 2018-08-15 ENCOUNTER — Encounter: Payer: Self-pay | Admitting: Internal Medicine

## 2018-08-15 DIAGNOSIS — I824Z1 Acute embolism and thrombosis of unspecified deep veins of right distal lower extremity: Secondary | ICD-10-CM | POA: Diagnosis not present

## 2018-08-15 DIAGNOSIS — E1122 Type 2 diabetes mellitus with diabetic chronic kidney disease: Secondary | ICD-10-CM

## 2018-08-15 DIAGNOSIS — I2609 Other pulmonary embolism with acute cor pulmonale: Secondary | ICD-10-CM

## 2018-08-15 DIAGNOSIS — I10 Essential (primary) hypertension: Secondary | ICD-10-CM

## 2018-08-15 DIAGNOSIS — I2510 Atherosclerotic heart disease of native coronary artery without angina pectoris: Secondary | ICD-10-CM

## 2018-08-15 DIAGNOSIS — E1159 Type 2 diabetes mellitus with other circulatory complications: Secondary | ICD-10-CM

## 2018-08-15 DIAGNOSIS — M0579 Rheumatoid arthritis with rheumatoid factor of multiple sites without organ or systems involvement: Secondary | ICD-10-CM

## 2018-08-15 DIAGNOSIS — E1121 Type 2 diabetes mellitus with diabetic nephropathy: Secondary | ICD-10-CM

## 2018-08-15 DIAGNOSIS — N183 Chronic kidney disease, stage 3 unspecified: Secondary | ICD-10-CM

## 2018-08-15 DIAGNOSIS — I152 Hypertension secondary to endocrine disorders: Secondary | ICD-10-CM

## 2018-08-15 NOTE — Progress Notes (Signed)
[facility] Room Number: [phone] Place of Service:  SNF [phone])  [name] [name]. [name] Hollingshead, MD  Patient Care Team: Helane Rima, DO as PCP - General (Family Medicine) Chilton Si, MD as PCP - Cardiology (Cardiology) Alejandro Mulling, RN as [facility] Care Management Leavy Cella, Romilda Joy, [facility] (Pharmacist) Alejandro Mulling, RN as [facility] Management  Extended Emergency Contact Information Primary Emergency Contact: Hampton,Jeannette Address: [address]          [place], [place] [address] Darden Amber of [place] Home Phone: [phone] Relation: Daughter Secondary Emergency Contact: [name] Address: [address]          [place], [place] [address] Darden Amber of [place] Mobile Phone: [phone] Relation: Daughter  Allergies  Allergen Reactions  . Clonidine Derivatives Swelling    [name]  . Fish Allergy Diarrhea, Swelling and Other (See Comments)    Turns skin "black," but can tolerate white fish Salmon- Diarrhea  . Shellfish Allergy Hives  . Doxycycline Rash  . Indomethacin Other (See Comments)    Reaction not recalled by the patient  . Lyrica [Pregabalin] Other (See Comments)    Hallucinations   . Methyldopa Other (See Comments)    Aldomet (for hypertension): Reaction not recalled by the patient  . Morphine And Related Other (See Comments)    Family reports it drops her bp that she needs iv fluids  . Orange Fruit [Citrus] Other (See Comments)    Indigestion/heartburn  . Strawberry (Diagnostic) Itching  . Cetirizine Hcl Itching and Rash  . Codeine Itching  . Levaquin [Levofloxacin In D5w] Rash  . Tomato Rash    Chief Complaint  Patient presents with  . Discharge Note    Discharge from [facility]    HPI:  84 y.o. female with history of rheumatoid arthritis on Arava, Plaquenil, and chronic prednisone,  chronic combined systolic and diastolic congestive heart failure, coronary artery disease, stage III chronic kidney disease, hypertension, diabetes mellitus 2, cholecystectomy in the month prior, gout, hyperlipidemia, seizures not on medication, stroke/TIA who presented to the ED on 9/15 for evaluation of acute onset of lightheadedness and left shoulder pain.  She reported she been experiencing some increased shoulder pain that she attributed to her rheumatoid arthritis but no chest pain or shortness of breath.  Upon arrival to the ED patient was not hypoxic but mildly tachypneic and transiently hypotensive to 80/53.  CTA chest abdomen pelvis was notable for PE in the right lower lobe.  Blood pressures normalized after 1.5 L of normal saline in the ED.  Patient was admitted to [facility] from 9/14-18 where she was treated for acute lower lobe PE.  Patient was discovered to have a acute right peroneal vein DVT and patient was treated with heparin initially then Xarelto, coagulation for 6 months recommended.  Secondary to hypotension patient's Coreg and hydralazine were resumed but at lower doses, to be titrated as needed.  Patient was admitted to skilled nursing facility for OT/PT and is now ready to be discharged home.    Past Medical History:  Diagnosis Date  . Allergic rhinitis due to pollen 04/27/2007  . Arthritis    "in q joint" (08/07/2013)  . CHF (congestive heart failure) (HCC)   . Coronary atherosclerosis of native coronary artery   . Cough   . Disorder of bone and cartilage, unspecified   . Edema 05/03/2013  .  Exertional shortness of breath    "sometimes" (08/07/2013)  . First degree atrioventricular block   . Gout, unspecified 04/19/2013  . Hyperlipidemia 04/27/2007  . Hypertension   . Muscle spasm   . Muscle weakness (generalized)   . Myocardial infarction (HCC) ~ 1970  . Onychia and paronychia of toe   . Osteoarthrosis, unspecified whether generalized or localized,  unspecified site 04/27/2007  . Other fall   . Other malaise and fatigue   . Pneumonia 05/2018  . Prepatellar bursitis   . Seizures (HCC)   . Stroke (HCC) 01/17/2014  . TIA (transient ischemic attack)    "a few one summer" (08/07/2013)  . Type II diabetes mellitus (HCC)    "fasting 90-110s" (08/07/2013)  . Unspecified essential hypertension 08/08/2013  . Unspecified vitamin D deficiency     Past Surgical History:  Procedure Laterality Date  . ABDOMINAL HYSTERECTOMY  04/1980  . APPENDECTOMY  1960  . CARDIAC CATHETERIZATION  2003  . CATARACT EXTRACTION W/ INTRAOCULAR LENS  IMPLANT, BILATERAL Bilateral ~ 2012  . CHOLECYSTECTOMY N/A 06/26/2018   Procedure: LAPAROSCOPIC CHOLECYSTECTOMY POSSIBLE INTRAOPERATIVE CHOLANGIOGRAM;  Surgeon: Abigail Miyamoto, MD;  Location: [facility] OR;  Service: General;  Laterality: N/A;  . JOINT REPLACEMENT    . REPLACEMENT TOTAL KNEE Right 09/2005  . RIGHT/LEFT HEART CATH AND CORONARY ANGIOGRAPHY N/A 06/01/2018   Procedure: RIGHT/LEFT HEART CATH AND CORONARY ANGIOGRAPHY;  Surgeon: Corky Crafts, MD;  Location: [facility] INVASIVE CV LAB;  Service: Cardiovascular;  Laterality: N/A;  . SHOULDER OPEN ROTATOR CUFF REPAIR Right 07/1999  . TONSILLECTOMY  08/1974  . TOTAL KNEE ARTHROPLASTY Left 08/07/2013   Procedure: TOTAL KNEE ARTHROPLASTY- LEFT;  Surgeon: Dannielle Huh, MD;  Location: [facility] OR;  Service: Orthopedics;  Laterality: Left;  . TRANSTHORACIC ECHOCARDIOGRAM  2003   EF 55-65%; mild concentric LVH     reports that she has never smoked. She has never used smokeless tobacco. She reports that she does not drink alcohol or use drugs. Social History   Socioeconomic History  . Marital status: Widowed    Spouse name: Not on file  . Number of children: Not on file  . Years of education: Not on file  . Highest education level: Not on file  Occupational History  . Not on file  Social Needs  . Financial resource strain: Not on file  . Food insecurity:    Worry: Not on  file    Inability: Not on file  . Transportation needs:    Medical: Not on file    Non-medical: Not on file  Tobacco Use  . Smoking status: Never Smoker  . Smokeless tobacco: Never Used  Substance and Sexual Activity  . Alcohol use: No    Alcohol/week: 0.0 standard drinks  . Drug use: No  . Sexual activity: Never  Lifestyle  . Physical activity:    Days per week: Not on file    Minutes per session: Not on file  . Stress: Not on file  Relationships  . Social connections:    Talks on phone: Not on file    Gets together: Not on file    Attends religious service: Not on file    Active member of club or organization: Not on file    Attends meetings of clubs or organizations: Not on file    Relationship status: Not on file  . Intimate partner violence:    Fear of current or ex partner: Not on file    Emotionally abused: Not on file  Physically abused: Not on file    Forced sexual activity: Not on file  Other Topics Concern  . Not on file  Social History Narrative   Widowed   Walks with cane    Pertinent  Health Maintenance Due  Topic Date Due  . OPHTHALMOLOGY EXAM  09/12/2015  . PNA vac Low Risk Adult (2 of 2 - PCV13) 09/11/2016  . FOOT EXAM  01/14/2018  . INFLUENZA VACCINE  08/28/2018 (Originally 06/09/2018)  . HEMOGLOBIN A1C  12/26/2018  . DEXA SCAN  Completed    Medications: Allergies as of 08/15/2018      Reactions   Clonidine Derivatives Swelling   [name]   Fish Allergy Diarrhea, Swelling, Other (See Comments)   Turns skin "black," but can tolerate white fish Salmon- Diarrhea   Shellfish Allergy Hives   Doxycycline Rash   Indomethacin Other (See Comments)   Reaction not recalled by the patient   Lyrica [pregabalin] Other (See Comments)   Hallucinations   Methyldopa Other (See Comments)   Aldomet (for hypertension): Reaction not recalled by the patient   Morphine And Related Other  (See Comments)   Family reports it drops her bp that she needs iv fluids   Orange Fruit [citrus] Other (See Comments)   Indigestion/heartburn   Strawberry (diagnostic) Itching   Cetirizine Hcl Itching, Rash   Codeine Itching   Levaquin [levofloxacin In D5w] Rash   Tomato Rash      Medication List        Accurate as of 08/15/18 11:59 PM. Always use your most recent med list.          albuterol 108 (90 Base) MCG/ACT inhaler Commonly known as:  PROVENTIL HFA;VENTOLIN HFA Inhale 1-2 puffs into the lungs every 6 (six) hours as needed for wheezing or shortness of breath.   allopurinol 100 MG tablet Commonly known as:  ZYLOPRIM Take 100 mg by mouth at bedtime.   calcium-vitamin D 500-200 MG-UNIT tablet Commonly known as:  OSCAL WITH D Take 1 tablet by mouth daily with breakfast.   carvedilol 6.25 MG tablet Commonly known as:  COREG Take 1 tablet (6.25 mg total) by mouth 2 (two) times daily with a meal.   D3-1000 1000 units capsule Generic drug:  Cholecalciferol Take 1,000 Units by mouth daily.   diclofenac sodium 1 % Gel Commonly known as:  VOLTAREN Apply 1 application topically daily.   ENSURE Take 237 mLs by mouth.   ENTRESTO 49-51 MG Generic drug:  sacubitril-valsartan Take 1 tablet by mouth daily at 12 noon.   ferrous sulfate 325 (65 FE) MG tablet Take 1 tablet (325 mg total) by mouth 2 (two) times daily with a meal.   furosemide 40 MG tablet Commonly known as:  LASIX Take 1 tablet (40 mg total) by mouth daily.   gabapentin 100 MG capsule Commonly known as:  NEURONTIN Take 1 capsule (100 mg total) by mouth at bedtime.   hydrALAZINE 25 MG tablet Commonly known as:  APRESOLINE Take 1 tablet (25 mg total) by mouth 3 (three) times daily.   hydroxychloroquine 200 MG tablet Commonly known as:  PLAQUENIL Take 200 mg by mouth 2 (two) times daily.   hydroxypropyl methylcellulose / hypromellose 2.5 % ophthalmic solution Commonly known as:  ISOPTO TEARS /  GONIOVISC Place 1 drop into both eyes daily.   isosorbide mononitrate 30 MG 24 hr tablet Commonly known as:  IMDUR Take 1 tablet (30 mg total) by  mouth at bedtime.   leflunomide 20 MG tablet Commonly known as:  ARAVA Take 20 mg by mouth daily.   metFORMIN 500 MG tablet Commonly known as:  GLUCOPHAGE Take 1 tablet (500 mg total) by mouth daily with breakfast.   Olopatadine HCl 0.2 % Soln Place 1 drop into both eyes daily.   potassium chloride SA 20 MEQ tablet Commonly known as:  K-DUR,KLOR-CON Take 1 tablet (20 mEq total) by mouth 2 (two) times daily.   predniSONE 10 MG tablet Commonly known as:  DELTASONE Take 1 tablet (10 mg total) by mouth daily with breakfast.   rivaroxaban 20 MG Tabs tablet Commonly known as:  XARELTO Take 20 mg by mouth daily with supper.   spironolactone 25 MG tablet Commonly known as:  ALDACTONE Take 12.5 mg by mouth daily.   triamcinolone cream 0.1 % Commonly known as:  KENALOG Apply 1 application topically 2 (two) times daily as needed (for itching- to affected sites).   vitamin C 500 MG tablet Commonly known as:  ASCORBIC ACID Take 500 mg by mouth daily.        Vitals:   08/15/18 1411  BP: 124/77  Pulse: 89  Resp: 18  Temp: (!) 97.3 F (36.3 C)  Weight: 173 lb (78.5 kg)  Height: 5\' 4"  (1.626 m)   Body mass index is 29.7 kg/m.  Physical Exam  GENERAL APPEARANCE: Alert, conversant. No acute distress.  HEENT: Unremarkable. RESPIRATORY: Breathing is even, unlabored. Lung sounds are clear   CARDIOVASCULAR: Heart RRR no murmurs, rubs or gallops. No peripheral edema.  GASTROINTESTINAL: Abdomen is soft, non-tender, not distended w/ normal bowel sounds.  NEUROLOGIC: Cranial nerves 2-12 grossly intact. Moves all extremities   Labs reviewed: Basic Metabolic Panel: Recent Labs    11/28/17 0809  05/29/18 0030  05/30/18 0449  06/01/18 0459  06/27/18 0630  07/23/18 2100 07/23/18 2115 07/24/18 0920 07/25/18 0808 08/01/18  08/08/18  NA 134*   < >  --    < > 143   < > 141  140   < > 140   < > 134* 137 136 141 141 142  K 4.5   < >  --    < > 4.1   < > 3.5  3.5   < > 3.5   < > 4.5 4.5 4.0 3.6 5.0 5.3  CL 107   < >  --    < > 108   < > 108  107   < > 104  --  102 103 103 109  --   --   CO2 21*   < >  --    < > 25   < > 26  27   < > 26  --  21*  --  21* 25  --   --   GLUCOSE 132*   < >  --    < > 94   < > 109*  110*   < > 108*  --  149* 149* 128* 109*  --   --   BUN 33*   < >  --    < > 24*   < > 22  21   < > 17   < > 39* 57* 30* 23 33* 43*  CREATININE 1.05*   < >  --    < > 1.31*   < > 1.00  0.98   < > 1.10*   < > 1.22* 1.20* 0.97 1.00 1.4* 1.4*  CALCIUM 8.7*   < >  --    < > 8.7*   < > 8.9  8.9   < > 8.7*  --  9.1  --  9.0 9.1  --   --   MG 2.7*  --  1.7  --  1.7  --   --   --   --   --   --   --   --   --   --   --   PHOS  --   --   --   --   --   --  2.7  --  2.7  --   --   --   --   --   --   --    < > = values in this interval not displayed.   No results found for: [facility] Liver Function Tests: Recent Labs    06/25/18 0607 06/26/18 0445 06/27/18 0630 07/23/18 2100  AST 27 24  --  23  ALT 104* 80*  --  8  ALKPHOS 184* 161*  --  61  BILITOT 0.9 0.9  --  0.8  PROT 6.6 6.5  --  6.1*  ALBUMIN 3.0* 3.0* 2.9* 3.4*   Recent Labs    06/22/18 2153 06/23/18 0500 07/23/18 2100  LIPASE 150* 77* 50   No results for input(s): AMMONIA in the last 8760 hours. CBC: Recent Labs    06/26/18 0445 06/27/18 0630  07/23/18 2100 07/23/18 2115 07/25/18 0808 08/01/18  WBC 8.0 11.9*   < > 9.6  --  9.6 9.3  NEUTROABS 6.2 8.9*  --  5.1  --   --   --   HGB 11.1* 10.5*   < > 11.7* 12.9 10.6* 11.3*  HCT 34.2* 32.7*   < > 37.4 38.0 32.8* 36  MCV 89.5 92.1  --  94.9  --  92.7  --   PLT 263 237   < > 287  --  237 303   < > = values in this interval not displayed.   Lipid Recent Labs    05/29/18 0030 06/24/18 0524 06/25/18 0607  CHOL 178 189 202*  HDL 73 63 63  LDLCALC 92 113* 126*  TRIG 64 65 67    Cardiac Enzymes: Recent Labs    07/24/18 0342 07/24/18 0920 07/24/18 1442  TROPONINI 0.03* 0.03* <0.03   BNP: Recent Labs    05/28/18 1614 06/22/18 2153 07/23/18 2100  BNP 2,077.6* 1,146.7* 138.3*   CBG: Recent Labs    07/26/18 2103 07/27/18 0738 07/27/18 1131  GLUCAP 137* 104* 144*    Procedures and Imaging Studies During Stay: Ct Head Wo Contrast  Result Date: 07/23/2018 CLINICAL DATA:  Patient with generalized weakness. EXAM: CT HEAD WITHOUT CONTRAST TECHNIQUE: Contiguous axial images were obtained from the base of the skull through the vertex without intravenous contrast. COMPARISON:  Brain CT 06/24/2018. FINDINGS: Brain: Ventricles and sulci are appropriate for patient's age. Periventricular and subcortical white matter hypodensity compatible with chronic microvascular ischemic changes. No evidence for acute cortically based infarct, intracranial hemorrhage, mass lesion or mass-effect. Vascular: Unremarkable Skull: Intact. Sinuses/Orbits: Unremarkable orbits. Paranasal sinuses are well aerated. Mastoid air cells are unremarkable. Other: None. IMPRESSION: No acute intracranial process. Electronically Signed   By: Annia Belt [name]   On: 07/23/2018 22:23   Dg Chest Port 1 View  Result Date: 07/23/2018 CLINICAL DATA:  Weakness and diaphoresis EXAM: PORTABLE CHEST 1 VIEW COMPARISON:  Chest radiograph 06/22/2018 FINDINGS:  Cardiomegaly is unchanged. Improved vascular congestion with mild residual. No focal airspace disease, large pleural effusion, or pneumothorax. Chronic deformity of both shoulders. IMPRESSION: Cardiomegaly. Improved vascular congestion from exam last month with mild residual. Electronically Signed   By: Narda Rutherford [name]   On: 07/23/2018 21:40   Ct Angio Chest/abd/pel For Dissection W And/or Wo Contrast  Result Date: 07/24/2018 CLINICAL DATA:  84 year old with recurrent syncope. EXAM: CT ANGIOGRAPHY CHEST, ABDOMEN AND PELVIS TECHNIQUE: Multidetector CT  imaging through the chest, abdomen and pelvis was performed using the standard protocol during bolus administration of intravenous contrast. Multiplanar reconstructed images and MIPs were obtained and reviewed to evaluate the vascular anatomy. CONTRAST:  ISOVUE-370 IOPAMIDOL (ISOVUE-370) INJECTION 76% COMPARISON:  Radiograph yesterday. Chest CT 06/15/2018. Chest abdomen pelvis CT 09/11/2015 FINDINGS: CTA CHEST FINDINGS Cardiovascular: Aortic tortuosity and atherosclerosis without dissection, acute aortic syndrome, aortic hematoma or aneurysm. Common origin of the brachiocephalic and left common carotid artery, normal variant arch configuration. Small volume thrombus in the right lower lobe pulmonary artery extending into segmental branch consistent with acute pulmonary embolus, new from prior exam. Multi chamber cardiomegaly is unchanged from prior exam. No evidence of right heart strain. Small pericardial effusion with fluid adjacent to the right ventricle. Mediastinum/Nodes: No enlarged mediastinal or hilar lymph nodes. Heterogeneous thyroid gland with subcentimeter nodule in the left lobe. The esophagus is nondistended. Lungs/Pleura: Prior nodular opacity in the right upper lobe has resolved since prior exam, was likely infectious or inflammatory. Subpleural nodule in the right middle lobe image 54 series 8 is unchanged. Hypoventilatory change at the lung bases. No confluent airspace disease, pleural effusion or pulmonary edema. Musculoskeletal: Chronic osseous remottling of the shoulders, bilateral shoulder joint effusions. Multilevel degenerative change throughout the spine. Prominent Schmorl's node superior endplate of T4. Review of the MIP images confirms the above findings. CTA ABDOMEN AND PELVIS FINDINGS VASCULAR Aorta: Moderate atherosclerosis without dissection, evidence of vasculitis, aneurysm or significant stenosis. Celiac: Mild atherosclerosis at the origin without stenosis or dissection. SMA:  Mild plaque at the origin without significant stenosis or dissection. Mesenteric branches are patent. Renals: Patent without dissection or significant stenosis. IMA: Patent without dissection or stenosis. Inflow: Mild atherosclerotic calcification without dissection or stenosis. Veins: No obvious venous abnormality within the limitations of this arterial phase study. Review of the MIP images confirms the above findings. NON-VASCULAR Hepatobiliary: No focal lesion on arterial phase exam. Common bile duct dilatation with CBD measuring 14 mm at the porta hepatis, unchanged from MRCP last month. Interval cholecystectomy. No fluid collection in the gallbladder fossa. Pancreas: No ductal dilatation or inflammation. Spleen: Normal in size without focal abnormality. Adrenals/Urinary Tract: No adrenal nodule. No hydronephrosis. Mild symmetric perinephric edema appears chronic. Simple cyst in the right kidney. Urinary bladder is distended, no bladder wall thickening. Stomach/Bowel: Colonic diverticulosis without diverticulitis. Moderate stool burden throughout the colon. No small bowel dilatation, inflammation, or obstruction. Stomach distended with ingested contents. Lymphatic: No abdominopelvic adenopathy. Reproductive: Status post hysterectomy. No adnexal masses. Other: No free air free fluid. Musculoskeletal: Chronic L1 compression fracture, upper most non-rib-bearing lumbar vertebra. Multilevel degenerative change in the lumbar spine. Anterolisthesis of L4 on L5 (numbering from superior most non-rib-bearing lumbar vertebra) is likely facet mediated. Peripherally sclerotic lucent lesion in the left proximal femur stable dating back to 09/11/2015 abdominal CT and considered benign. No acute osseous abnormalities. Review of the MIP images confirms the above findings. IMPRESSION: 1. Acute small volume pulmonary embolus in the right lower lobe, new from CT last  month. 2. Diffuse thoracoabdominal aortic atherosclerosis without  dissection or acute aortic abnormality. Chronic cardiomegaly. 3. Prior right upper lobe nodular opacity on chest CT has resolved, was likely infectious or inflammatory. 4. No acute findings in the abdomen or pelvis. Colonic diverticulosis without diverticulitis. 5. Recent cholecystectomy without complication. Biliary dilatation is chronic. Aortic Atherosclerosis (ICD10-I70.0). These results were called by telephone at the time of interpretation on 07/24/2018 at 1:18 am to PA [facility] , who verbally acknowledged these results. Electronically Signed   By: Narda Rutherford [name]   On: 07/24/2018 01:19    Assessment/Plan:   Other acute pulmonary embolism with acute cor pulmonale (HCC)  Acute deep vein thrombosis (DVT) of distal vein of right lower extremity (HCC)  Essential hypertension  CKD stage 3 due to type 2 diabetes mellitus (HCC)  Type 2 diabetes mellitus with diabetic nephropathy, without long-term current use of insulin (HCC)  Hypertension associated with diabetes (HCC)  Coronary artery disease involving native coronary artery, angina presence unspecified, unspecified whether native or transplanted heart  Rheumatoid arthritis involving multiple sites with positive rheumatoid factor (HCC)   Patient is being discharged with the following home health services: OT/PT  Patient is being discharged with the following durable medical equipment: None  Patient has been advised to f/u with their PCP in 1-2 weeks to bring them up to date on their rehab stay.  Social services at facility was responsible for arranging this appointment.  Pt was provided with a 30 day supply of prescriptions for medications and refills must be obtained from their PCP.  For controlled substances, a more limited supply may be provided adequate until PCP appointment only.  Medications have been reconciled .  Time spent > 30 min;> 50% of time with patient was spent reviewing records, labs, tests and studies,  counseling and developing plan of care  [name] [name]. [name] Hollingshead, MD

## 2018-08-16 ENCOUNTER — Encounter: Payer: Self-pay | Admitting: Internal Medicine

## 2018-08-16 ENCOUNTER — Other Ambulatory Visit: Payer: Self-pay | Admitting: Licensed Clinical Social Worker

## 2018-08-16 NOTE — Patient Outreach (Signed)
[facility]) Care Management  08/16/2018  [name] [name] 01/01/1930 [id number]  [facility] CSW arrived at [facility] to complete [facility] Consult and prepare for [name] [name]'s expected discharge back home. [facility] CSW met with SNF social worker Marita [place] and was informed that [name] [name]'s set discharge date is 08/18/18. Marita [place] has not completed orders yet but will let [facility] CSW know so that [facility] CSW can update [facility] RNCM who will be following [name] [name] post SNF discharge. Per [facility] CM Services Multidisciplinary Team suggestions, [name] [name] would benefit from the the [facility] Heart Failure program as well post SNF discharge. [facility] CSW met with [name] [name] successfully and discussed discharge updates. [name] [name] reports that she is doing very well and is very ready to return home. [name] [name] reports that her daughter is suppose to be "working out" getting [name] [name] additional personal care hours within the home which will be private pay. [place] CSW educated [name] [name] on the [facility] Caregivers Solution program and [name] [name] denied needing service as she does not need help with applying for programs or applications and is already aware that she does not qualify for Medicaid. [name] [name] prefers to be referred back to [facility] and Pharmacist upon her discharge back home as they were unable to get established with her at her last SNF discharge because she had to get re-hospitalized after her return back home. [name] [name] reports that her appetite and sleeping habits are "fine." She shares that she is not experiencing any pain or discomfort at this time. [facility] CSW will follow up with SNF social worker on 08/18/18 to gain final discharge updates before signing off and transitioning case to [place] and Pharmacist.   Eula Fried, [place], MSW, [email or web address] Phone: [phone] Fax: [phone]

## 2018-08-18 ENCOUNTER — Other Ambulatory Visit: Payer: Self-pay | Admitting: Licensed Clinical Social Worker

## 2018-08-18 NOTE — Patient Outreach (Signed)
[facility]) Care Management  08/18/2018  [name] [name] [birth year] [id number]  Patient is set to discharge back home from [facility] today on 08/18/18. Patient will discharge back home with [facility] services. [facility] CSW will sign off at this time. [facility] CSW will refer patient back to [facility] Pharmacist and RNCM to help with transition back home in the community.   [name], BSW, MSW, LCSW [employer] [email or web address] Phone: [phone] Fax: [phone]

## 2018-08-19 ENCOUNTER — Telehealth: Payer: Self-pay | Admitting: Family Medicine

## 2018-08-19 ENCOUNTER — Other Ambulatory Visit: Payer: Self-pay | Admitting: *Deleted

## 2018-08-19 ENCOUNTER — Encounter: Payer: Self-pay | Admitting: *Deleted

## 2018-08-19 DIAGNOSIS — Z961 Presence of intraocular lens: Secondary | ICD-10-CM | POA: Diagnosis not present

## 2018-08-19 DIAGNOSIS — I420 Dilated cardiomyopathy: Secondary | ICD-10-CM | POA: Diagnosis not present

## 2018-08-19 DIAGNOSIS — Z96653 Presence of artificial knee joint, bilateral: Secondary | ICD-10-CM | POA: Diagnosis not present

## 2018-08-19 DIAGNOSIS — M1991 Primary osteoarthritis, unspecified site: Secondary | ICD-10-CM | POA: Diagnosis not present

## 2018-08-19 DIAGNOSIS — I130 Hypertensive heart and chronic kidney disease with heart failure and stage 1 through stage 4 chronic kidney disease, or unspecified chronic kidney disease: Secondary | ICD-10-CM | POA: Diagnosis not present

## 2018-08-19 DIAGNOSIS — Z7901 Long term (current) use of anticoagulants: Secondary | ICD-10-CM | POA: Diagnosis not present

## 2018-08-19 DIAGNOSIS — N183 Chronic kidney disease, stage 3 (moderate): Secondary | ICD-10-CM | POA: Diagnosis not present

## 2018-08-19 DIAGNOSIS — M109 Gout, unspecified: Secondary | ICD-10-CM | POA: Diagnosis not present

## 2018-08-19 DIAGNOSIS — M069 Rheumatoid arthritis, unspecified: Secondary | ICD-10-CM | POA: Diagnosis not present

## 2018-08-19 DIAGNOSIS — Z7984 Long term (current) use of oral hypoglycemic drugs: Secondary | ICD-10-CM | POA: Diagnosis not present

## 2018-08-19 DIAGNOSIS — I2510 Atherosclerotic heart disease of native coronary artery without angina pectoris: Secondary | ICD-10-CM | POA: Diagnosis not present

## 2018-08-19 DIAGNOSIS — D631 Anemia in chronic kidney disease: Secondary | ICD-10-CM | POA: Diagnosis not present

## 2018-08-19 DIAGNOSIS — E1122 Type 2 diabetes mellitus with diabetic chronic kidney disease: Secondary | ICD-10-CM | POA: Diagnosis not present

## 2018-08-19 DIAGNOSIS — I69928 Other speech and language deficits following unspecified cerebrovascular disease: Secondary | ICD-10-CM | POA: Diagnosis not present

## 2018-08-19 DIAGNOSIS — I5043 Acute on chronic combined systolic (congestive) and diastolic (congestive) heart failure: Secondary | ICD-10-CM | POA: Diagnosis not present

## 2018-08-19 DIAGNOSIS — I252 Old myocardial infarction: Secondary | ICD-10-CM | POA: Diagnosis not present

## 2018-08-19 DIAGNOSIS — Z86711 Personal history of pulmonary embolism: Secondary | ICD-10-CM | POA: Diagnosis not present

## 2018-08-19 DIAGNOSIS — Z9181 History of falling: Secondary | ICD-10-CM | POA: Diagnosis not present

## 2018-08-19 NOTE — Telephone Encounter (Signed)
Copied from CRM [id number]. Topic: Quick Communication - See Telephone Encounter >> Aug 19, 2018 12:52 PM Terisa Starr wrote: CRM for notification. See Telephone encounter for: 08/19/18.  Pam, RN with [facility] called to get orders for nursing ::  One week one Two times for the next 3 weeks One time for the next 2 weeks 2 prn's  No open wounds, heart rate goes up and down with activity , today it was 100. Please call back and ask to speak to [facility] or [place] [phone] (office) [phone] (fax)

## 2018-08-19 NOTE — Telephone Encounter (Signed)
Dr. Earlene Plater, please see message, okay for verbal orders?

## 2018-08-19 NOTE — Patient Outreach (Signed)
[facility]) Care Management  08/19/2018  [name] [name] 10-03-1930 [id number]    Transition of care   RN spoke with pt's consented [name] and introduced [facility] services and the purpose for today's call. Discussed pt's current issues and involvement with [facility] for nursing and PT services.  Offered additional assistance after review with no immediate needs. RN verified [facility] pharmacy would continue to contact pt concerning medication needs and offered a home visit for home evaluation or education on any of her ongoing needs. Daughter declined at this time however receptive to ongoing transition of care call over the next few weeks. Discussed a generated plan of care involving prevention measures for readmission , adherence with medication and all medical appointments based up pt's ongoing recovery. Will schedule another follow up call next week as agreed.   [facility] CM Care Plan Problem One     Most Recent Value  Care Plan Problem One  Recent hospitalization related to syncope  Role Documenting the Problem One  Care Management Coordinator  Care Plan for Problem One  Active  [facility] Long Term Goal   Pt will not have readmmission to the hosiutal setting over the next 60 days.  [facility] Long Term Goal Start Date  08/19/18  Interventions for Problem One Long Term Goal  Will discuss prevention measures and verified pt has support care inside the home to assist with such prevention measures and close monitoring during her ongoing recovery.  [facility] CM Short Term Goal #1   Adherence with medication over the next 30 days  [facility] CM Short Term Goal #1 Start Date  08/19/18  Interventions for Short Term Goal #1  Will stress the importance of taking all prescribed medications dur her time of recovery to prevent acutes from occuring. Will attempt to review medication however caregiver does not have the list of medications but able to verify pt taking all that has been prescribed. Note [facility]  pharmacy to follow Victorino Dike)  [facility] CM Short Term Goal #2   Adherence with medical appointments over the next 30 days  [facility] CM Short Term Goal #2 Start Date  08/19/18  Interventions for Short Term Goal #2  Will verify sufficient transportaion and offer additional resoruces such as SCATs for transport needs if necessary in the future. Will verified the importance of attending all appointments as pt's medical condition may change and require more interventions.       Elliot Cousin, RN Care Management Coordinator [facility] Main Office [phone]

## 2018-08-19 NOTE — Telephone Encounter (Signed)
Okay orders. 

## 2018-08-21 NOTE — Progress Notes (Deleted)
[name] [name] is a 84 y.o. female is here for follow up.  History of Present Illness:   {CMA SCRIBE ATTESTATION}  HPI:   Health Maintenance Due  Topic Date Due  . TETANUS/TDAP  04/14/2015  . OPHTHALMOLOGY EXAM  09/12/2015  . PNA vac Low Risk Adult (2 of 2 - PCV13) 09/11/2016  . FOOT EXAM  01/14/2018   Depression screen PHQ 2/9 06/28/2017 03/29/2017  Decreased Interest 0 0  Down, Depressed, Hopeless 0 0  PHQ - 2 Score 0 0  Some recent data might be hidden   PMHx, SurgHx, SocialHx, FamHx, Medications, and Allergies were reviewed in the Visit Navigator and updated as appropriate.   Patient Active Problem List   Diagnosis Date Noted  . Chronic combined systolic and diastolic CHF (congestive heart failure) (HCC) 09/12/2015    Priority: High  . CKD stage 3 due to type 2 diabetes mellitus (HCC) 06/05/2014    Priority: High  . DM (diabetes mellitus), type 2 with renal complications (HCC) 06/05/2014    Priority: High    Class: Chronic  . Rheumatoid arthritis involving multiple sites (HCC) 12/19/2013    Priority: High    Class: Chronic  . Hypertension associated with diabetes (HCC) 08/08/2013    Priority: High    Class: Chronic  . Deep vein thrombosis (DVT) of distal vein of right lower extremity (HCC) 07/30/2018  . Hypertensive heart disease with heart failure (HCC) 07/30/2018  . Acute pulmonary embolism (HCC) 07/24/2018  . Hypertension 06/18/2018  . Elevated troponin   . DCM (dilated cardiomyopathy) (HCC)   . Benign hypertensive heart and kidney disease with CHF and stage 3 chronic kidney disease (HCC) 05/28/2018  . Osteoarthritis 05/06/2018  . Adhesive capsulitis of right shoulder 10/12/2017  . Idiopathic chronic gout of multiple sites with tophus 08/25/2017  . Rheumatic nodule 06/28/2017  . History of total knee arthroplasty, bilateral 02/18/2017  . History of rotator cuff surgery 02/18/2017  . Obesity (BMI 30.0-34.9) 12/08/2016  . High risk medications (not  anticoagulants) long-term use 09/21/2016  . Hammer toe 10/31/2015  . Midline low back pain without sciatica 06/27/2014  . Bilateral edema of lower extremity 06/05/2014  . Anemia of chronic disease 05/01/2014  . Fluctuating blood pressure 02/06/2014  . Stroke (HCC) 01/17/2014  . Syncope 01/17/2014  . CAD (coronary artery disease) 10/18/2013  . Fibromyalgia 05/10/2013  . Reactive airway disease 04/19/2013  . Gout 04/19/2013  . Type 2 diabetes mellitus with hyperlipidemia (HCC) 04/27/2007  . Allergic rhinitis 04/27/2007  . Diverticulosis 04/27/2007   Social History   Tobacco Use  . Smoking status: Never Smoker  . Smokeless tobacco: Never Used  Substance Use Topics  . Alcohol use: No    Alcohol/week: 0.0 standard drinks  . Drug use: No   Current Medications and Allergies:   Current Outpatient Medications:  .  albuterol (PROVENTIL HFA;VENTOLIN HFA) 108 (90 Base) MCG/ACT inhaler, Inhale 1-2 puffs into the lungs every 6 (six) hours as needed for wheezing or shortness of breath. , Disp: , Rfl:  .  allopurinol (ZYLOPRIM) 100 MG tablet, Take 100 mg by mouth at bedtime. , Disp: , Rfl:  .  calcium-vitamin D (OSCAL WITH D) 500-200 MG-UNIT tablet, Take 1 tablet by mouth daily with breakfast., Disp: , Rfl:  .  carvedilol (COREG) 6.25 MG tablet, Take 1 tablet (6.25 mg total) by mouth 2 (two) times daily with a meal., Disp: , Rfl:  .  D3-1000 1000 units capsule, Take 1,000 Units by mouth  daily., Disp: , Rfl: 0 .  diclofenac sodium (VOLTAREN) 1 % GEL, Apply 1 application topically daily. (Patient taking differently: Apply 1 application topically See admin instructions. Apply to painful areas of shoulders and arms daily as directed), Disp: 100 g, Rfl: 4 .  ENSURE (ENSURE), Take 237 mLs by mouth., Disp: , Rfl:  .  ferrous sulfate 325 (65 FE) MG tablet, Take 1 tablet (325 mg total) by mouth 2 (two) times daily with a meal., Disp: 60 tablet, Rfl: 0 .  furosemide (LASIX) 40 MG tablet, Take 1 tablet (40  mg total) by mouth daily., Disp: 30 tablet, Rfl: 0 .  gabapentin (NEURONTIN) 100 MG capsule, Take 1 capsule (100 mg total) by mouth at bedtime., Disp: 30 capsule, Rfl: 3 .  hydrALAZINE (APRESOLINE) 100 MG tablet, Take 1 tablet by mouth 3 (three) times daily., Disp: , Rfl:  .  hydroxychloroquine (PLAQUENIL) 200 MG tablet, Take 200 mg by mouth 2 (two) times daily., Disp: , Rfl:  .  hydroxypropyl methylcellulose / hypromellose (ISOPTO TEARS / GONIOVISC) 2.5 % ophthalmic solution, Place 1 drop into both eyes daily., Disp: , Rfl:  .  isosorbide mononitrate (IMDUR) 30 MG 24 hr tablet, Take 1 tablet (30 mg total) by mouth at bedtime., Disp: , Rfl:  .  leflunomide (ARAVA) 20 MG tablet, Take 20 mg by mouth daily., Disp: , Rfl:  .  metFORMIN (GLUCOPHAGE) 500 MG tablet, Take 1 tablet (500 mg total) by mouth daily with breakfast., Disp: 90 tablet, Rfl: 2 .  Olopatadine HCl 0.2 % SOLN, Place 1 drop into both eyes daily., Disp: , Rfl:  .  potassium chloride SA (K-DUR,KLOR-CON) 20 MEQ tablet, Take 1 tablet (20 mEq total) by mouth 2 (two) times daily., Disp: , Rfl:  .  predniSONE (DELTASONE) 10 MG tablet, Take 1 tablet (10 mg total) by mouth daily with breakfast., Disp: 5 tablet, Rfl: 0 .  rivaroxaban (XARELTO) 20 MG TABS tablet, Take 20 mg by mouth daily with supper., Disp: , Rfl:  .  sacubitril-valsartan (ENTRESTO) 49-51 MG, Take 1 tablet by mouth daily at 12 noon. , Disp: , Rfl:  .  spironolactone (ALDACTONE) 25 MG tablet, Take 12.5 mg by mouth daily., Disp: , Rfl:  .  triamcinolone cream (KENALOG) 0.1 %, Apply 1 application topically 2 (two) times daily as needed (for itching- to affected sites). , Disp: , Rfl:  .  vitamin C (ASCORBIC ACID) 500 MG tablet, Take 500 mg by mouth daily., Disp: , Rfl:    Allergies  Allergen Reactions  . Clonidine Derivatives Swelling    [name]  . Fish Allergy Diarrhea, Swelling and Other (See Comments)     Turns skin "black," but can tolerate white fish Salmon- Diarrhea  . Shellfish Allergy Hives  . Doxycycline Rash  . Indomethacin Other (See Comments)    Reaction not recalled by the patient  . Lyrica [Pregabalin] Other (See Comments)    Hallucinations   . Methyldopa Other (See Comments)    Aldomet (for hypertension): Reaction not recalled by the patient  . Morphine And Related Other (See Comments)    Family reports it drops her bp that she needs iv fluids  . Orange Fruit [Citrus] Other (See Comments)    Indigestion/heartburn  . Strawberry (Diagnostic) Itching  . Cetirizine Hcl Itching and Rash  . Codeine Itching  . Levaquin [Levofloxacin In D5w] Rash  . Tomato Rash   Review of Systems   Pertinent items  are noted in the HPI. Otherwise, ROS is negative.  Vitals:  There were no vitals filed for this visit.   There is no height or weight on file to calculate BMI.  Physical Exam:   Physical Exam  Results for orders placed or performed in visit on 08/10/18  Basic metabolic panel  Result Value Ref Range   Glucose 108    BUN 43 (A) 4 - 21   Creatinine 1.4 (A) 0.5 - 1.1   Potassium 5.3 3.4 - 5.3   Sodium 142 137 - 147    Assessment and Plan:   Diagnoses and all orders for this visit:  Diverticulosis  Seasonal allergic rhinitis due to pollen  Type 2 diabetes mellitus with hyperlipidemia (HCC)    . Reviewed expectations re: course of current medical issues. . Discussed self-management of symptoms. . Outlined signs and symptoms indicating need for more acute intervention. . Patient verbalized understanding and all questions were answered. Marland Kitchen Health Maintenance issues including appropriate healthy diet, exercise, and smoking avoidance were discussed with patient. . See orders for this visit as documented in the electronic medical record. . Patient received an After Visit Summary.  *** CMA served as Neurosurgeon during this visit. History, Physical, and Plan performed by  medical provider. The above documentation has been reviewed and is accurate and complete. Helane Rima, D.O.  Helane Rima, DO [place] 08/22/2018

## 2018-08-22 ENCOUNTER — Ambulatory Visit: Payer: Medicare Other | Admitting: Family Medicine

## 2018-08-22 NOTE — Telephone Encounter (Signed)
Left detailed message on [name] [name]'s personal voicemail, verbal orders given for Nursing One x a week for one week, Two times for the next 3 weeks, One time for the next 2 weeks and 2 x's prn's okay per Dr. Earlene Plater for pt. Any questions please call office.

## 2018-08-23 ENCOUNTER — Other Ambulatory Visit: Payer: Self-pay

## 2018-08-23 NOTE — Patient Outreach (Signed)
[facility]) Care Management  08/23/2018  BIRIDIANA TWARDOWSKI 04-08-1930 [id number]    84year old femaleeferred to [facility] services requested formedication management. Patient is a recent discharge from a SNF. PMHx includes, but not limited to, coronary artery disease, hypertension, congestive heart failure, dilated cardiomyopathy, cerebral infarct, diverticulosis, CKD Stage 3, diabetes mellitus, rheumatoid arthritis, gout, fibromyalgia, and hyperlipidemia.   Unsuccessful outreach attempt #1 to Ms. Cashaw and her daughter [name].  Plan: Outreach attempt #2 in 3-4 business days.   Will send unsuccessful outreach attempt letter.  Berlin Hun, PharmD Clinical Pharmacist [facility] [phone]

## 2018-08-24 ENCOUNTER — Other Ambulatory Visit (HOSPITAL_COMMUNITY): Payer: Medicare Other

## 2018-08-26 ENCOUNTER — Other Ambulatory Visit: Payer: Self-pay | Admitting: *Deleted

## 2018-08-26 ENCOUNTER — Ambulatory Visit: Payer: Medicare Other | Admitting: Cardiovascular Disease

## 2018-08-26 NOTE — Patient Outreach (Signed)
[facility]) Care Management  08/26/2018  MIKINZIE MACIEJEWSKI [birth year] [id number]  Transition of care follow up  Spoke with daughter Para March who indicates pt is doing well with no encountered problems. States pt is currently out of town in the care of another her sister. Reviewed the current plan of care and adjusted interventions based upon pt's progress and current situation. Will review once again on the next follow up call next week. Daughter very appreciative for the calls and had no request at this time for additional services.   [facility] CM Care Plan Problem One     Most Recent Value  Care Plan Problem One  Recent hospitalization related to syncope  Role Documenting the Problem One  Care Management Coordinator  Care Plan for Problem One  Active  [facility] Long Term Goal   Pt will not have readmmission to the hospital setting over the next 60 days.  [facility] Long Term Goal Start Date  08/19/18  Interventions for Problem One Long Term Goal  Will continue to verifiy pt's management of care to prevent risk of readmissions. WIll continue to strongly encouraged adherence with the ongoing prevention measures discussed in managing pt's ongoing care.  [facility] CM Short Term Goal #1   Adherence with medication over the next 30 days  [facility] CM Short Term Goal #1 Start Date  08/19/18  Interventions for Short Term Goal #1  Will verified pt's adherence with medications usage with supportive efforts from family. WIll extend due to pt is outr of town with ofer family members caring for her at this time.  [facility] CM Short Term Goal #2   Adherence with medical appointments over the next 30 days  [facility] CM Short Term Goal #2 Start Date  08/19/18  Interventions for Short Term Goal #2  Will review appointments and continue to encouraged adherence with attending all medical appointments. Will verified family has contacted [facility] to delay services due to pt if out of town. Will extend to allow adherence with this task.      Elliot Cousin, RN Care Management Coordinator [facility] Main Office [phone]

## 2018-08-28 NOTE — Progress Notes (Signed)
[name] [name] is a 84 y.o. female is here for follow up.  History of Present Illness:   HPI: See Assessment and Plan section for Problem Based Charting of issues discussed today.  Health Maintenance Due  Topic Date Due  . TETANUS/TDAP  04/14/2015  . OPHTHALMOLOGY EXAM  09/12/2015  . PNA vac Low Risk Adult (2 of 2 - PCV13) 09/11/2016  . FOOT EXAM  01/14/2018  . INFLUENZA VACCINE  06/09/2018   Depression screen PHQ 2/9 06/28/2017 03/29/2017  Decreased Interest 0 0  Down, Depressed, Hopeless 0 0  PHQ - 2 Score 0 0  Some recent data might be hidden   PMHx, SurgHx, SocialHx, FamHx, Medications, and Allergies were reviewed in the Visit Navigator and updated as appropriate.   Patient Active Problem List   Diagnosis Date Noted  . Refusal of statin medication by patient 09/04/2018  . Deep vein thrombosis (DVT) of distal vein of right lower extremity (HCC) 07/30/2018  . Hypertensive heart disease with heart failure (HCC) 07/30/2018  . Pulmonary embolism (HCC), 07/2018, on Xarelto with goal to DC after 6 months if more active 07/10/2018  . DCM (dilated cardiomyopathy) (HCC)   . Benign hypertensive heart and kidney disease with CHF and stage 3 chronic kidney disease (HCC) 05/28/2018  . Osteoarthritis 05/06/2018  . Adhesive capsulitis of right shoulder 10/12/2017  . Idiopathic chronic gout of multiple sites with tophus 08/25/2017  . Rheumatic nodule 06/28/2017  . History of total knee arthroplasty, bilateral 02/18/2017  . History of rotator cuff surgery 02/18/2017  . Obesity (BMI 30.0-34.9) 12/08/2016  . High risk medications, long-term use 09/21/2016  . Hammer toe 10/31/2015  . Chronic combined systolic and diastolic CHF (congestive heart failure) (HCC) 09/12/2015  . Midline low back pain without sciatica 06/27/2014  . Bilateral edema of lower extremity 06/05/2014  . CKD stage 3 due to type 2 diabetes mellitus (HCC) 06/05/2014  . DM (diabetes mellitus), type 2 with renal  complications (HCC) 06/05/2014    Class: Chronic  . Anemia of chronic disease 05/01/2014  . Fluctuating blood pressure 02/06/2014  . Stroke (HCC) 01/17/2014  . Rheumatoid arthritis involving multiple sites (HCC) 12/19/2013    Class: Chronic  . CAD (coronary artery disease) 10/18/2013  . Hypertension associated with diabetes (HCC) 08/08/2013    Class: Chronic  . Fibromyalgia 05/10/2013  . Reactive airway disease 04/19/2013  . Gout 04/19/2013  . Type 2 diabetes mellitus with hyperlipidemia (HCC), patient declines statin 04/27/2007  . Allergic rhinitis 04/27/2007  . Diverticulosis 04/27/2007   Social History   Tobacco Use  . Smoking status: Never Smoker  . Smokeless tobacco: Never Used  Substance Use Topics  . Alcohol use: No    Alcohol/week: 0.0 standard drinks  . Drug use: No   Current Medications and Allergies:   .  albuterol (PROVENTIL HFA;VENTOLIN HFA) 108 (90 Base) MCG/ACT inhaler, Inhale 1-2 puffs into the lungs every 6 (six) hours as needed for wheezing or shortness of breath. , Disp: , Rfl:  .  allopurinol (ZYLOPRIM) 100 MG tablet, Take 100 mg by mouth at bedtime. , Disp: , Rfl:  .  calcium-vitamin D (OSCAL WITH D) 500-200 MG-UNIT tablet, Take 1 tablet by mouth daily with breakfast., Disp: , Rfl:  .  carvedilol (COREG) 6.25 MG tablet, Take 1 tablet (6.25 mg total) by mouth 2 (two) times daily with a meal., Disp: , Rfl:  .  D3-1000 1000 units capsule, Take 1,000 Units by mouth daily., Disp: , Rfl: 0 .  diclofenac sodium (VOLTAREN) 1 % GEL, Apply 1 application topically daily. (Patient taking differently: Apply 1 application topically See admin instructions. Apply to painful areas of shoulders and arms daily as directed), Disp: 100 g, Rfl: 4 .  ENSURE (ENSURE), Take 237 mLs by mouth., Disp: , Rfl:  .  ferrous sulfate 325 (65 FE) MG tablet, Take 1 tablet (325 mg total) by mouth 2 (two) times daily with a meal., Disp: 60 tablet, Rfl: 0 .  furosemide (LASIX) 40 MG tablet, Take 1  tablet (40 mg total) by mouth daily., Disp: 30 tablet, Rfl: 0 .  gabapentin (NEURONTIN) 100 MG capsule, Take 1 capsule (100 mg total) by mouth at bedtime., Disp: 30 capsule, Rfl: 3 .  hydrALAZINE (APRESOLINE) 100 MG tablet, Take 1 tablet by mouth 3 (three) times daily., Disp: , Rfl:  .  hydroxychloroquine (PLAQUENIL) 200 MG tablet, Take 200 mg by mouth 2 (two) times daily., Disp: , Rfl:  .  hydroxypropyl methylcellulose / hypromellose (ISOPTO TEARS / GONIOVISC) 2.5 % ophthalmic solution, Place 1 drop into both eyes daily., Disp: , Rfl:  .  isosorbide mononitrate (IMDUR) 30 MG 24 hr tablet, Take 1 tablet (30 mg total) by mouth at bedtime., Disp: , Rfl:  .  leflunomide (ARAVA) 20 MG tablet, Take 20 mg by mouth daily., Disp: , Rfl:  .  metFORMIN (GLUCOPHAGE) 500 MG tablet, Take 1 tablet (500 mg total) by mouth daily with breakfast., Disp: 90 tablet, Rfl: 2 .  Olopatadine HCl 0.2 % SOLN, Place 1 drop into both eyes daily., Disp: , Rfl:  .  potassium chloride SA (K-DUR,KLOR-CON) 20 MEQ tablet, Take 1 tablet (20 mEq total) by mouth 2 (two) times daily., Disp: , Rfl:  .  predniSONE (DELTASONE) 10 MG tablet, Take 1 tablet (10 mg total) by mouth daily with breakfast., Disp: 5 tablet, Rfl: 0 .  rivaroxaban (XARELTO) 20 MG TABS tablet, Take 20 mg by mouth daily with supper., Disp: , Rfl:  .  sacubitril-valsartan (ENTRESTO) 49-51 MG, Take 1 tablet by mouth daily at 12 noon. , Disp: , Rfl:  .  spironolactone (ALDACTONE) 25 MG tablet, Take 12.5 mg by mouth daily., Disp: , Rfl:  .  triamcinolone cream (KENALOG) 0.1 %, Apply 1 application topically 2 (two) times daily as needed (for itching- to affected sites). , Disp: , Rfl:  .  vitamin C (ASCORBIC ACID) 500 MG tablet, Take 500 mg by mouth daily., Disp: , Rfl:    Allergies  Allergen Reactions  . Clonidine Derivatives Swelling    [name]  . Fish Allergy Diarrhea, Swelling and Other (See  Comments)    Turns skin "black," but can tolerate white fish Salmon- Diarrhea  . Shellfish Allergy Hives  . Doxycycline Rash  . Indomethacin Other (See Comments)    Reaction not recalled by the patient  . Lyrica [Pregabalin] Other (See Comments)    Hallucinations   . Methyldopa Other (See Comments)    Aldomet (for hypertension): Reaction not recalled by the patient  . Morphine And Related Other (See Comments)    Family reports it drops her bp that she needs iv fluids  . Orange Fruit [Citrus] Other (See Comments)    Indigestion/heartburn  . Strawberry (Diagnostic) Itching  . Cetirizine Hcl Itching and Rash  . Codeine Itching  . Levaquin [Levofloxacin In D5w] Rash  . Tomato Rash   Review of Systems   Pertinent items are noted in the HPI. Otherwise, ROS  is negative.  Vitals:   Vitals:   08/29/18 1315  BP: (!) 166/77  Pulse: 85  Temp: 97.8 F (36.6 C)  TempSrc: Oral  SpO2: 97%  Weight: 177 lb (80.3 kg)  Height: 5\' 4"  (1.626 m)     Body mass index is 30.38 kg/m.  Physical Exam:   Physical Exam  Constitutional: She is oriented to person, place, and time. She appears well-nourished.  Patient looks well today. Sitting in wheelchair.  HENT:  Head: Normocephalic and atraumatic.  Eyes: Pupils are equal, round, and reactive to light. EOM are normal.  Neck: Normal range of motion. Neck supple.  Cardiovascular: Normal rate, regular rhythm and intact distal pulses.  Pulmonary/Chest: Effort normal.  Abdominal: Soft.  Musculoskeletal: She exhibits no edema.  Neurological: She is alert and oriented to person, place, and time.  Skin: Skin is warm.  Previous lower extremity nodules healed.   Psychiatric: She has a normal mood and affect. Her behavior is normal.  Nursing note and vitals reviewed.  Diabetic Foot Exam - Simple   Simple Foot Form Diabetic Foot exam was performed with the following findings:  Yes 09/04/2018 12:45 PM  Visual Inspection See comments:   Yes Sensation Testing Intact to touch and monofilament testing bilaterally:  Yes Pulse Check Posterior Tibialis and Dorsalis pulse intact bilaterally:  Yes Comments    Results for orders placed or performed in visit on 08/29/18  CBC with Differential/Platelet  Result Value Ref Range   WBC 9.1 4.0 - 10.5 K/uL   RBC 3.74 (L) 3.87 - 5.11 Mil/uL   Hemoglobin 11.4 (L) 12.0 - 15.0 g/dL   HCT 79.0 (L) 24.0 - 97.3 %   MCV 92.0 78.0 - 100.0 fl   MCHC 33.2 30.0 - 36.0 g/dL   RDW 53.2 (H) 99.2 - 42.6 %   Platelets 280.0 150.0 - 400.0 K/uL   Neutrophils Relative % 78.0 (H) 43.0 - 77.0 %   Lymphocytes Relative 15.4 12.0 - 46.0 %   Monocytes Relative 5.3 3.0 - 12.0 %   Eosinophils Relative 0.5 0.0 - 5.0 %   Basophils Relative 0.8 0.0 - 3.0 %   Neutro Abs 7.1 1.4 - 7.7 K/uL   Lymphs Abs 1.4 0.7 - 4.0 K/uL   Monocytes Absolute 0.5 0.1 - 1.0 K/uL   Eosinophils Absolute 0.0 0.0 - 0.7 K/uL   Basophils Absolute 0.1 0.0 - 0.1 K/uL  Comprehensive metabolic panel  Result Value Ref Range   Sodium 139 135 - 145 mEq/L   Potassium 3.8 3.5 - 5.1 mEq/L   Chloride 107 96 - 112 mEq/L   CO2 23 19 - 32 mEq/L   Glucose, Bld 116 (H) 70 - 99 mg/dL   BUN 29 (H) 6 - 23 mg/dL   Creatinine, Ser 8.34 0.40 - 1.20 mg/dL   Total Bilirubin 0.3 0.2 - 1.2 mg/dL   Alkaline Phosphatase 47 39 - 117 U/L   AST 17 0 - 37 U/L   ALT 10 0 - 35 U/L   Total Protein 6.7 6.0 - 8.3 g/dL   Albumin 3.7 3.5 - 5.2 g/dL   Calcium 9.8 8.4 - 19.6 mg/dL   GFR 22.29 >79.89 mL/min   Assessment and Plan:   Chronic combined systolic and diastolic CHF (congestive heart failure) (HCC) Followed by Cardiology. Cardiovascular ROS: no chest pain or dyspnea on exertion. Compliant with medications. Edema improved.  Pulmonary embolism (HCC), 07/2018, on Xarelto with goal to DC after 6 months if more active Doing well. Discontinue  Xarelto around 01/2019.  CKD stage 3 due to type 2 diabetes mellitus (HCC) Lab Results  Component Value Date    CREATININE 0.97 08/29/2018   CREATININE 1.4 (A) 08/08/2018   CREATININE 1.4 (A) 08/01/2018   Improved. Will continue to monitor.  Type 2 diabetes mellitus with hyperlipidemia (HCC), patient declines statin Diabetic ROS - medication compliance: compliant most of the time, diabetic diet compliance: compliant most of the time, home glucose monitoring: is not performed.  New concerns: doing well, home and exercising with PT.   Lab review: orders written for new lab studies as appropriate; see orders.  Assessment: Diabetes Mellitus: stable.  Plan: See orders for this visit as documented in the electronic medical record. Diabetic issues reviewed with her: all medications, side effects and compliance discussed carefully, diabetic Sick Day rules reviewed, handout given, foot care discussed and Podiatry visits discussed and annual eye examinations at Ophthalmology discussed.  Rheumatoid arthritis involving multiple sites [facility]) Followed by Rheumatology. Medications include Arava, Plaquenil, and Prednisone. Pain control with Voltaren and Tramadol.   Gout No nodules or flares. On Allopurinol.   Anemia of chronic disease CBC Latest Ref Rng & Units 08/29/2018 08/01/2018 07/25/2018  WBC 4.0 - 10.5 K/uL 9.1 9.3 9.6  Hemoglobin 12.0 - 15.0 g/dL 11.4(L) 11.3(A) 10.6(L)  Hematocrit 36.0 - 46.0 % 34.4(L) 36 32.8(L)  Platelets 150.0 - 400.0 K/uL 280.0 303 237   Stable. Will monitor, especially while on Xarelto.  Orders Placed This Encounter  Procedures  . CBC with Differential/Platelet  . Comprehensive metabolic panel   . Reviewed expectations re: course of current medical issues. . Discussed self-management of symptoms. . Outlined signs and symptoms indicating need for more acute intervention. . Patient verbalized understanding and all questions were answered. Marland Kitchen Health Maintenance issues including appropriate healthy diet, exercise, and smoking avoidance were discussed with patient. . See orders for  this visit as documented in the electronic medical record. . Patient received an After Visit Summary.  CMA served as Neurosurgeon during this visit. History, Physical, and Plan performed by medical provider. The above documentation has been reviewed and is accurate and complete. Helane Rima, D.O.  Helane Rima, DO [place], [facility] 09/04/2018

## 2018-08-29 ENCOUNTER — Ambulatory Visit: Payer: Self-pay

## 2018-08-29 ENCOUNTER — Ambulatory Visit (INDEPENDENT_AMBULATORY_CARE_PROVIDER_SITE_OTHER): Payer: Medicare Other | Admitting: Family Medicine

## 2018-08-29 ENCOUNTER — Other Ambulatory Visit: Payer: Self-pay

## 2018-08-29 VITALS — BP 166/77 | HR 85 | Temp 97.8°F | Ht 64.0 in | Wt 177.0 lb

## 2018-08-29 DIAGNOSIS — I152 Hypertension secondary to endocrine disorders: Secondary | ICD-10-CM

## 2018-08-29 DIAGNOSIS — Z79899 Other long term (current) drug therapy: Secondary | ICD-10-CM

## 2018-08-29 DIAGNOSIS — E1169 Type 2 diabetes mellitus with other specified complication: Secondary | ICD-10-CM

## 2018-08-29 DIAGNOSIS — I10 Essential (primary) hypertension: Secondary | ICD-10-CM | POA: Diagnosis not present

## 2018-08-29 DIAGNOSIS — E1122 Type 2 diabetes mellitus with diabetic chronic kidney disease: Secondary | ICD-10-CM

## 2018-08-29 DIAGNOSIS — I5042 Chronic combined systolic (congestive) and diastolic (congestive) heart failure: Secondary | ICD-10-CM

## 2018-08-29 DIAGNOSIS — E785 Hyperlipidemia, unspecified: Secondary | ICD-10-CM

## 2018-08-29 DIAGNOSIS — M0579 Rheumatoid arthritis with rheumatoid factor of multiple sites without organ or systems involvement: Secondary | ICD-10-CM | POA: Diagnosis not present

## 2018-08-29 DIAGNOSIS — Z5320 Procedure and treatment not carried out because of patient's decision for unspecified reasons: Secondary | ICD-10-CM

## 2018-08-29 DIAGNOSIS — D638 Anemia in other chronic diseases classified elsewhere: Secondary | ICD-10-CM

## 2018-08-29 DIAGNOSIS — Z5329 Procedure and treatment not carried out because of patient's decision for other reasons: Secondary | ICD-10-CM

## 2018-08-29 DIAGNOSIS — N183 Chronic kidney disease, stage 3 unspecified: Secondary | ICD-10-CM

## 2018-08-29 DIAGNOSIS — E1159 Type 2 diabetes mellitus with other circulatory complications: Secondary | ICD-10-CM

## 2018-08-29 DIAGNOSIS — M1A09X0 Idiopathic chronic gout, multiple sites, without tophus (tophi): Secondary | ICD-10-CM

## 2018-08-29 DIAGNOSIS — I2609 Other pulmonary embolism with acute cor pulmonale: Secondary | ICD-10-CM

## 2018-08-29 LAB — CBC WITH DIFFERENTIAL/PLATELET
Basophils Absolute: 0.1 10*3/uL (ref 0.0–0.1)
Basophils Relative: 0.8 % (ref 0.0–3.0)
Eosinophils Absolute: 0 10*3/uL (ref 0.0–0.7)
Eosinophils Relative: 0.5 % (ref 0.0–5.0)
HCT: 34.4 % — ABNORMAL LOW (ref 36.0–46.0)
Hemoglobin: 11.4 g/dL — ABNORMAL LOW (ref 12.0–15.0)
Lymphocytes Relative: 15.4 % (ref 12.0–46.0)
Lymphs Abs: 1.4 10*3/uL (ref 0.7–4.0)
MCHC: 33.2 g/dL (ref 30.0–36.0)
MCV: 92 fl (ref 78.0–100.0)
Monocytes Absolute: 0.5 10*3/uL (ref 0.1–1.0)
Monocytes Relative: 5.3 % (ref 3.0–12.0)
Neutro Abs: 7.1 10*3/uL (ref 1.4–7.7)
Neutrophils Relative %: 78 % — ABNORMAL HIGH (ref 43.0–77.0)
Platelets: 280 10*3/uL (ref 150.0–400.0)
RBC: 3.74 Mil/uL — ABNORMAL LOW (ref 3.87–5.11)
RDW: 20.9 % — ABNORMAL HIGH (ref 11.5–15.5)
WBC: 9.1 10*3/uL (ref 4.0–10.5)

## 2018-08-29 LAB — COMPREHENSIVE METABOLIC PANEL
ALT: 10 U/L (ref 0–35)
AST: 17 U/L (ref 0–37)
Albumin: 3.7 g/dL (ref 3.5–5.2)
Alkaline Phosphatase: 47 U/L (ref 39–117)
BUN: 29 mg/dL — ABNORMAL HIGH (ref 6–23)
CO2: 23 mEq/L (ref 19–32)
Calcium: 9.8 mg/dL (ref 8.4–10.5)
Chloride: 107 mEq/L (ref 96–112)
Creatinine, Ser: 0.97 mg/dL (ref 0.40–1.20)
GFR: 69.96 mL/min (ref >60.00)
Glucose, Bld: 116 mg/dL — ABNORMAL HIGH (ref 70–99)
Potassium: 3.8 mEq/L (ref 3.5–5.1)
Sodium: 139 mEq/L (ref 135–145)
Total Bilirubin: 0.3 mg/dL (ref 0.2–1.2)
Total Protein: 6.7 g/dL (ref 6.0–8.3)

## 2018-08-29 NOTE — Patient Outreach (Signed)
[facility]) Care Management  08/29/2018  HILDA RYNDERS [birth year] [id number]    Unsuccessful call attempt #2 to Ms. Keniston' daughter, Ms. Hampton.  Left HIPAA compliant voice message requesting a return call.  Incoming call received from Ms. Hampton.  HIPAA identifiers verified.  Ms. Rolley Sims states that she has been trying to "get her mom straightened out" and she think everything is fine now.  She declined [facility] services at this time.  Encouraged daughter to call me is she has any medication questions or concerns.   Will close [facility] case.   Plan: Route discipline closure letter to PCP, Dr. Earlene Plater.   Berlin Hun, PharmD Clinical Pharmacist [facility] [phone]

## 2018-08-30 ENCOUNTER — Ambulatory Visit (HOSPITAL_COMMUNITY): Payer: Medicare Other | Attending: Cardiovascular Disease

## 2018-08-30 ENCOUNTER — Other Ambulatory Visit: Payer: Self-pay

## 2018-08-30 DIAGNOSIS — I5033 Acute on chronic diastolic (congestive) heart failure: Secondary | ICD-10-CM | POA: Diagnosis not present

## 2018-08-31 ENCOUNTER — Encounter: Payer: Self-pay | Admitting: Cardiovascular Disease

## 2018-08-31 ENCOUNTER — Ambulatory Visit (INDEPENDENT_AMBULATORY_CARE_PROVIDER_SITE_OTHER): Payer: Medicare Other | Admitting: Cardiovascular Disease

## 2018-08-31 VITALS — BP 134/78 | HR 79 | Ht 64.0 in | Wt 177.0 lb

## 2018-08-31 DIAGNOSIS — M069 Rheumatoid arthritis, unspecified: Secondary | ICD-10-CM | POA: Diagnosis not present

## 2018-08-31 DIAGNOSIS — Z7901 Long term (current) use of anticoagulants: Secondary | ICD-10-CM | POA: Diagnosis not present

## 2018-08-31 DIAGNOSIS — Z961 Presence of intraocular lens: Secondary | ICD-10-CM | POA: Diagnosis not present

## 2018-08-31 DIAGNOSIS — I420 Dilated cardiomyopathy: Secondary | ICD-10-CM | POA: Diagnosis not present

## 2018-08-31 DIAGNOSIS — Z86711 Personal history of pulmonary embolism: Secondary | ICD-10-CM | POA: Diagnosis not present

## 2018-08-31 DIAGNOSIS — I5032 Chronic diastolic (congestive) heart failure: Secondary | ICD-10-CM | POA: Diagnosis not present

## 2018-08-31 DIAGNOSIS — M1991 Primary osteoarthritis, unspecified site: Secondary | ICD-10-CM | POA: Diagnosis not present

## 2018-08-31 DIAGNOSIS — Z9181 History of falling: Secondary | ICD-10-CM | POA: Diagnosis not present

## 2018-08-31 DIAGNOSIS — E1122 Type 2 diabetes mellitus with diabetic chronic kidney disease: Secondary | ICD-10-CM | POA: Diagnosis not present

## 2018-08-31 DIAGNOSIS — Z96653 Presence of artificial knee joint, bilateral: Secondary | ICD-10-CM | POA: Diagnosis not present

## 2018-08-31 DIAGNOSIS — N183 Chronic kidney disease, stage 3 (moderate): Secondary | ICD-10-CM | POA: Diagnosis not present

## 2018-08-31 DIAGNOSIS — I2609 Other pulmonary embolism with acute cor pulmonale: Secondary | ICD-10-CM

## 2018-08-31 DIAGNOSIS — M109 Gout, unspecified: Secondary | ICD-10-CM | POA: Diagnosis not present

## 2018-08-31 DIAGNOSIS — I2510 Atherosclerotic heart disease of native coronary artery without angina pectoris: Secondary | ICD-10-CM | POA: Diagnosis not present

## 2018-08-31 DIAGNOSIS — I252 Old myocardial infarction: Secondary | ICD-10-CM | POA: Diagnosis not present

## 2018-08-31 DIAGNOSIS — Z7984 Long term (current) use of oral hypoglycemic drugs: Secondary | ICD-10-CM | POA: Diagnosis not present

## 2018-08-31 DIAGNOSIS — I130 Hypertensive heart and chronic kidney disease with heart failure and stage 1 through stage 4 chronic kidney disease, or unspecified chronic kidney disease: Secondary | ICD-10-CM | POA: Diagnosis not present

## 2018-08-31 DIAGNOSIS — D631 Anemia in chronic kidney disease: Secondary | ICD-10-CM | POA: Diagnosis not present

## 2018-08-31 DIAGNOSIS — I10 Essential (primary) hypertension: Secondary | ICD-10-CM | POA: Diagnosis not present

## 2018-08-31 DIAGNOSIS — I69928 Other speech and language deficits following unspecified cerebrovascular disease: Secondary | ICD-10-CM | POA: Diagnosis not present

## 2018-08-31 DIAGNOSIS — I5043 Acute on chronic combined systolic (congestive) and diastolic (congestive) heart failure: Secondary | ICD-10-CM | POA: Diagnosis not present

## 2018-08-31 NOTE — Patient Instructions (Signed)
Medication Instructions:  Your physician recommends that you continue on your current medications as directed. Please refer to the Current Medication list given to you today.  If you need a refill on your cardiac medications before your next appointment, please call your pharmacy.   Lab work: NONE  Testing/Procedures: NONE   Follow-Up: At [facility], you and your health needs are our priority.  As part of our continuing mission to provide you with exceptional heart care, we have created designated Provider Care Teams.  These Care Teams include your primary Cardiologist (physician) and Advanced Practice Providers (APPs -  Physician Assistants and Nurse Practitioners) who all work together to provide you with the care you need, when you need it. You will need a follow up appointment in 6 months.  Please call our office 2 months in advance to schedule this appointment.  You may see Tiffany Lefors, MD or one of the following Advanced Practice Providers on your designated Care Team:   Luke Kilroy, PA-C Krista Kroeger, PA-C . Callie Goodrich, PA-C   

## 2018-08-31 NOTE — Progress Notes (Signed)
6   Cardiology Office Note   Date:  08/31/2018   ID:  [name] [name], DOB 17-Oct-1930, MRN [id number]  PCP:  Helane Rima, DO  Cardiologist:   Chilton Si, MD   No chief complaint on file.   History of Present Illness: [name] [name] is a 84 y.o. female with systolic and diastolic heart failure, hypertension, Rheumatoid Arthritis, diabetes type 2, CAD s/p MI, and CVA who presents for follow up.  She was initially seen in 2016 for management of chronic diastolic heart failure.  [name] [name] was admitted to the [facility] Medicine service 11/1-11/3 with chest pain.  At that time she reported constant chest pain that began in the setting of a cold.  CT scan of the chest was concerning for edema.  She received IV lasix and diuresed with improvement in her breathing.  She was also started on oral lasix.  At her initial appointment she was feeling well.  She was admitted 05/2018 with weakness, chest discomfort, and shortness of breath.  She was found to have pneumonia and heart failure.  BNP was also elevated to 2000 and troponin was mildly elevated to 0.06.  Echocardiogram 05/29/2018 revealed LVEF 20 to 25% and moderate pulmonary hypertension.  She underwent left and right heart catheterization and had no obstructive CAD.  [name] [name] was admitted 06/2018 with gallstone pancreatitis.  Cardiology was consulted and she was felt to be at acceptable risk for surgery.  She underwent laparoscopic cholecystectomy.  She has recovered quite well.  Since discharge she has been feeling well.  She feels stronger each day.  She is still eating small meals and has little appetite.  She is doing physical therapy and trying to walk around her home.  She did have a 3 pound increase in her weight.  She was discharged on Lasix 40 mg but due to confusion was only taking 20 mg.  After increasing dose to 40 mg her weight has gone back to her baseline of 170 to 171 pounds.  At her last appointment losartan was switched to  [facility].  Since that time she had an echo 08/2018 that revealed LVEF 60 to 65% with grade 1 diastolic dysfunction.  There is mild mitral stenosis with a mild to moderately calcified annulus.  Since that time she has been feeling well.  Her only complaint is pain in her joints from arthritis.  This time of year is difficult for her.  She has not had any shortness of breath or chest pain.  She denies any lower extremity edema, orthopnea, or PND.  She is happy to be back home and out of rehab.   Past Medical History:  Diagnosis Date  . Allergic rhinitis due to pollen 04/27/2007  . Arthritis    "in q joint" (08/07/2013)  . CHF (congestive heart failure) (HCC)   . Coronary atherosclerosis of native coronary artery   . Cough   . Disorder of bone and cartilage, unspecified   . Edema 05/03/2013  . Exertional shortness of breath    "sometimes" (08/07/2013)  . First degree atrioventricular block   . Gout, unspecified 04/19/2013  . Hyperlipidemia 04/27/2007  . Hypertension   . Muscle spasm   . Muscle weakness (generalized)   . Myocardial infarction (HCC) ~ 1970  . Onychia and paronychia of toe   . Osteoarthrosis, unspecified whether generalized or localized, unspecified site 04/27/2007  . Other fall   . Other malaise and fatigue   . Pneumonia 05/2018  . Prepatellar bursitis   .  Pulmonary embolism (HCC) 07/2018  . Seizures (HCC)   . Stroke (HCC) 01/17/2014  . TIA (transient ischemic attack)    "a few one summer" (08/07/2013)  . Type II diabetes mellitus (HCC)    "fasting 90-110s" (08/07/2013)  . Unspecified essential hypertension 08/08/2013  . Unspecified vitamin D deficiency     Past Surgical History:  Procedure Laterality Date  . ABDOMINAL HYSTERECTOMY  04/1980  . APPENDECTOMY  1960  . CARDIAC CATHETERIZATION  2003  . CATARACT EXTRACTION W/ INTRAOCULAR LENS  IMPLANT, BILATERAL Bilateral ~ 2012  . CHOLECYSTECTOMY N/A 06/26/2018   Procedure: LAPAROSCOPIC CHOLECYSTECTOMY POSSIBLE  INTRAOPERATIVE CHOLANGIOGRAM;  Surgeon: Abigail Miyamoto, MD;  Location: [facility] OR;  Service: General;  Laterality: N/A;  . JOINT REPLACEMENT    . REPLACEMENT TOTAL KNEE Right 09/2005  . RIGHT/LEFT HEART CATH AND CORONARY ANGIOGRAPHY N/A 06/01/2018   Procedure: RIGHT/LEFT HEART CATH AND CORONARY ANGIOGRAPHY;  Surgeon: Corky Crafts, MD;  Location: [facility] INVASIVE CV LAB;  Service: Cardiovascular;  Laterality: N/A;  . SHOULDER OPEN ROTATOR CUFF REPAIR Right 07/1999  . TONSILLECTOMY  08/1974  . TOTAL KNEE ARTHROPLASTY Left 08/07/2013   Procedure: TOTAL KNEE ARTHROPLASTY- LEFT;  Surgeon: Dannielle Huh, MD;  Location: [facility] OR;  Service: Orthopedics;  Laterality: Left;  . TRANSTHORACIC ECHOCARDIOGRAM  2003   EF 55-65%; mild concentric LVH     Current Outpatient Medications  Medication Sig Dispense Refill  . albuterol (PROVENTIL HFA;VENTOLIN HFA) 108 (90 Base) MCG/ACT inhaler Inhale 1-2 puffs into the lungs every 6 (six) hours as needed for wheezing or shortness of breath.     . allopurinol (ZYLOPRIM) 100 MG tablet Take 100 mg by mouth at bedtime.     . calcium-vitamin D (OSCAL WITH D) 500-200 MG-UNIT tablet Take 1 tablet by mouth daily with breakfast.    . carvedilol (COREG) 6.25 MG tablet Take 1 tablet (6.25 mg total) by mouth 2 (two) times daily with a meal.    . D3-1000 1000 units capsule Take 1,000 Units by mouth daily.  0  . diclofenac sodium (VOLTAREN) 1 % GEL Apply 1 application topically daily. (Patient taking differently: Apply 1 application topically See admin instructions. Apply to painful areas of shoulders and arms daily as directed) 100 g 4  . ENSURE (ENSURE) Take 237 mLs by mouth.    . ferrous sulfate 325 (65 FE) MG tablet Take 1 tablet (325 mg total) by mouth 2 (two) times daily with a meal. 60 tablet 0  . furosemide (LASIX) 40 MG tablet Take 1 tablet (40 mg total) by mouth daily. 30 tablet 0  . gabapentin (NEURONTIN) 100 MG capsule Take 1 capsule (100 mg total) by mouth at bedtime. 30  capsule 3  . hydrALAZINE (APRESOLINE) 100 MG tablet Take 1 tablet by mouth 3 (three) times daily.    . hydroxychloroquine (PLAQUENIL) 200 MG tablet Take 200 mg by mouth 2 (two) times daily.    . hydroxypropyl methylcellulose / hypromellose (ISOPTO TEARS / GONIOVISC) 2.5 % ophthalmic solution Place 1 drop into both eyes daily.    . isosorbide mononitrate (IMDUR) 30 MG 24 hr tablet Take 1 tablet (30 mg total) by mouth at bedtime.    Marland Kitchen leflunomide (ARAVA) 20 MG tablet Take 20 mg by mouth daily.    . metFORMIN (GLUCOPHAGE) 500 MG tablet Take 1 tablet (500 mg total) by mouth daily with breakfast. 90 tablet 2  . Olopatadine HCl 0.2 % SOLN Place 1 drop into both eyes daily.    . potassium chloride  SA (K-DUR,KLOR-CON) 20 MEQ tablet Take 1 tablet (20 mEq total) by mouth 2 (two) times daily.    . predniSONE (DELTASONE) 10 MG tablet Take 1 tablet (10 mg total) by mouth daily with breakfast. 5 tablet 0  . rivaroxaban (XARELTO) 20 MG TABS tablet Take 20 mg by mouth daily with supper.    . sacubitril-valsartan (ENTRESTO) 49-51 MG Take 1 tablet by mouth daily at 12 noon.     Marland Kitchen spironolactone (ALDACTONE) 25 MG tablet Take 12.5 mg by mouth daily.    . traMADol (ULTRAM) 50 MG tablet Take 1 tablet by mouth every 6 (six) hours as needed.  0  . triamcinolone cream (KENALOG) 0.1 % Apply 1 application topically 2 (two) times daily as needed (for itching- to affected sites).     . vitamin C (ASCORBIC ACID) 500 MG tablet Take 500 mg by mouth daily.     No current facility-administered medications for this visit.     Allergies:   Clonidine derivatives; Fish allergy; Shellfish allergy; Doxycycline; Indomethacin; Lyrica [pregabalin]; Methyldopa; Morphine and related; Orange fruit [citrus]; Strawberry (diagnostic); Cetirizine hcl; Codeine; Levaquin [levofloxacin in d5w]; and Tomato    Social History:  The patient  reports that she has never smoked. She has never used smokeless tobacco. She reports that she does not drink  alcohol or use drugs.   Family History:  The patient's family history includes Heart attack in her father.    ROS:  Please see the history of present illness.   Otherwise, review of systems are positive for allergies.   All other systems are reviewed and negative.    PHYSICAL EXAM: VS:  BP 134/78 (BP Location: Right Arm, Patient Position: Sitting, Cuff Size: Normal)   Pulse 79   Ht 5\' 4"  (1.626 m)   Wt 177 lb (80.3 kg)   SpO2 98%   BMI 30.38 kg/m  , BMI Body mass index is 30.38 kg/m. GENERAL:  Well appearing HEENT: Pupils equal round and reactive, fundi not visualized, oral mucosa unremarkable NECK:  No jugular venous distention, waveform within normal limits, carotid upstroke brisk and symmetric, no bruits LUNGS:  Clear to auscultation bilaterally HEART:  RRR.  PMI not displaced or sustained,S1 and S2 within normal limits, no S3, no S4, no clicks, no rubs, no murmurs ABD:  Flat, positive bowel sounds normal in frequency in pitch, no bruits, no rebound, no guarding, no midline pulsatile mass, no hepatomegaly, no splenomegaly EXT:  2 plus pulses throughout, no edema, no cyanosis no clubbing SKIN:  No rashes no nodules NEURO:  Cranial nerves II through XII grossly intact, motor grossly intact throughout PSYCH:  Cognitively intact, oriented to person place and time   EKG:  EKG is not ordered today.  LHC 06/01/18:  LV end diastolic pressure is moderately elevated.  There is no aortic valve stenosis.  Hemodynamic findings consistent with moderate pulmonary hypertension.  CO 5.68 L/min; CI 2.97; Ao sat 99%; PA sat 65%; mean PA 42 mm Hg; mean PCWP 28 mm Hg  Nonobstructive CAD.   Nonischemic cardiomyopathy with mild to moderate CAD.   Echo 05/29/18: Study Conclusions  - Left ventricle: The cavity size was normal. Wall thickness was   increased in a pattern of moderate LVH. Systolic function was   severely reduced. The estimated ejection fraction was in the   range of 20% to  25%. Although no diagnostic regional wall motion   abnormality was identified, this possibility cannot be completely   excluded on the basis  of this study. - Aortic valve: Mildly calcified annulus. Trileaflet; normal   thickness, moderately calcified leaflets. There was mild   regurgitation. - Mitral valve: Mildly calcified annulus. Mildly calcified leaflets   . There was mild to moderate regurgitation. - Left atrium: The atrium was moderately to severely dilated. - Right ventricle: The cavity size was mildly dilated. Wall   thickness was normal. Systolic function was mildly reduced. - Right atrium: The atrium was moderately dilated. - Atrial septum: There was a small patent foramen ovale. There was   a left-to-right shunt. - Tricuspid valve: There was moderate regurgitation. - Pulmonary arteries: Systolic pressure was moderately increased.   PA peak pressure: 47 mm Hg (S). - Pericardium, extracardiac: A trivial pericardial effusion was   identified.  Echo 01/18/14: Study Conclusions  - Left ventricle: The cavity size was normal. Wall thickness was increased in a pattern of moderate to severe LVH. There was moderate focal basal hypertrophy of the septum. Systolic function was normal. The estimated ejection fraction was in the range of 60% to 65%. Wall motion was normal; there were no regional wall motion abnormalities. Doppler parameters are consistent with abnormal left ventricular relaxation (grade 1 diastolic dysfunction). - Left atrium: The atrium was moderately dilated. - Pulmonary arteries: Systolic pressure was mildly increased.  Carotid ultrasound 01/18/14: Summary: Bilateral: intimal wall thickening CCA. 1-39% ICA stenosis. Vertebral artery flow is antegrade.   Recent Labs: 05/30/2018: Magnesium 1.7 06/23/2018: TSH 1.742 07/23/2018: B Natriuretic Peptide 138.3 08/29/2018: ALT 10; BUN 29; Creatinine, Ser 0.97; Hemoglobin 11.4; Platelets 280.0; Potassium  3.8; Sodium 139    Lipid Panel    Component Value Date/Time   CHOL 202 (H) 06/25/2018 0607   CHOL 185 03/04/2016 1620   TRIG 67 06/25/2018 0607   HDL 63 06/25/2018 0607   HDL 73 03/04/2016 1620   CHOLHDL 3.2 06/25/2018 0607   VLDL 13 06/25/2018 0607   LDLCALC 126 (H) 06/25/2018 0607   LDLCALC 89 03/04/2016 1620      Wt Readings from Last 3 Encounters:  08/31/18 177 lb (80.3 kg)  08/29/18 177 lb (80.3 kg)  08/15/18 173 lb (78.5 kg)      ASSESSMENT AND PLAN:  # Chronic systolic and diastolic heart failure:  She did not have any obstructive CAD on cath.  Systolic function has returned to normal.  Continue carvedilol, hydralazine, isosorbide mononitrate, spironolactone and Entresto.  # Hypertension: BP slightly above goal but much better.  Continue carvedilol, hydralazine, Entresto, and spironolactone.   # Chest pain: Resolved.  No CAD on cath.   # CAD s/p MI: [name] [name] had an MI in the 39s.  No obstructive CAD on cath in 2019.  Continue carvedilol.  She is not interested in a statin.   # PE: [name] [name] had a PE 07/2018 soon after having pneumonia.  She is on Xarelto.  Will likely discontinue in 6 month presuming she is more active at that time.   Current medicines are reviewed at length with the patient today.  The patient does not have concerns regarding medicines.  The following changes have been made:  no change  Labs/ tests ordered today include:   No orders of the defined types were placed in this encounter.    Disposition:   FU with Floris Neuhaus C. Duke Salvia, MD, [facility] in 6 months.   Signed, Jusiah Aguayo C. Duke Salvia, MD, [facility]  08/31/2018 8:40 AM    [facility]

## 2018-09-02 ENCOUNTER — Other Ambulatory Visit: Payer: Self-pay | Admitting: *Deleted

## 2018-09-02 DIAGNOSIS — I252 Old myocardial infarction: Secondary | ICD-10-CM | POA: Diagnosis not present

## 2018-09-02 DIAGNOSIS — I5043 Acute on chronic combined systolic (congestive) and diastolic (congestive) heart failure: Secondary | ICD-10-CM | POA: Diagnosis not present

## 2018-09-02 DIAGNOSIS — N183 Chronic kidney disease, stage 3 (moderate): Secondary | ICD-10-CM | POA: Diagnosis not present

## 2018-09-02 DIAGNOSIS — I69928 Other speech and language deficits following unspecified cerebrovascular disease: Secondary | ICD-10-CM | POA: Diagnosis not present

## 2018-09-02 DIAGNOSIS — Z96653 Presence of artificial knee joint, bilateral: Secondary | ICD-10-CM | POA: Diagnosis not present

## 2018-09-02 DIAGNOSIS — I130 Hypertensive heart and chronic kidney disease with heart failure and stage 1 through stage 4 chronic kidney disease, or unspecified chronic kidney disease: Secondary | ICD-10-CM | POA: Diagnosis not present

## 2018-09-02 DIAGNOSIS — M1991 Primary osteoarthritis, unspecified site: Secondary | ICD-10-CM | POA: Diagnosis not present

## 2018-09-02 DIAGNOSIS — I2510 Atherosclerotic heart disease of native coronary artery without angina pectoris: Secondary | ICD-10-CM | POA: Diagnosis not present

## 2018-09-02 DIAGNOSIS — Z7901 Long term (current) use of anticoagulants: Secondary | ICD-10-CM | POA: Diagnosis not present

## 2018-09-02 DIAGNOSIS — M109 Gout, unspecified: Secondary | ICD-10-CM | POA: Diagnosis not present

## 2018-09-02 DIAGNOSIS — D631 Anemia in chronic kidney disease: Secondary | ICD-10-CM | POA: Diagnosis not present

## 2018-09-02 DIAGNOSIS — Z86711 Personal history of pulmonary embolism: Secondary | ICD-10-CM | POA: Diagnosis not present

## 2018-09-02 DIAGNOSIS — Z961 Presence of intraocular lens: Secondary | ICD-10-CM | POA: Diagnosis not present

## 2018-09-02 DIAGNOSIS — I420 Dilated cardiomyopathy: Secondary | ICD-10-CM | POA: Diagnosis not present

## 2018-09-02 DIAGNOSIS — M069 Rheumatoid arthritis, unspecified: Secondary | ICD-10-CM | POA: Diagnosis not present

## 2018-09-02 DIAGNOSIS — E1122 Type 2 diabetes mellitus with diabetic chronic kidney disease: Secondary | ICD-10-CM | POA: Diagnosis not present

## 2018-09-02 DIAGNOSIS — Z7984 Long term (current) use of oral hypoglycemic drugs: Secondary | ICD-10-CM | POA: Diagnosis not present

## 2018-09-02 DIAGNOSIS — Z9181 History of falling: Secondary | ICD-10-CM | POA: Diagnosis not present

## 2018-09-02 NOTE — Patient Outreach (Signed)
[facility]) Care Management  09/02/2018  [name] [name] [birth year] [id number]   Unsuccessful attempt to reach pt today however was able to leave a HIPAA approved voice message requesting a call back. Will continue to intervene with ongoing management of care at that time. Will reschedule another follow up call.   Elliot Cousin, RN Care Management Coordinator [facility] Main Office [phone]

## 2018-09-04 ENCOUNTER — Encounter: Payer: Self-pay | Admitting: Family Medicine

## 2018-09-04 DIAGNOSIS — Z5329 Procedure and treatment not carried out because of patient's decision for other reasons: Secondary | ICD-10-CM | POA: Insufficient documentation

## 2018-09-04 DIAGNOSIS — Z5320 Procedure and treatment not carried out because of patient's decision for unspecified reasons: Secondary | ICD-10-CM | POA: Insufficient documentation

## 2018-09-04 NOTE — Assessment & Plan Note (Signed)
CBC Latest Ref Rng & Units 08/29/2018 08/01/2018 07/25/2018  WBC 4.0 - 10.5 K/uL 9.1 9.3 9.6  Hemoglobin 12.0 - 15.0 g/dL 11.4(L) 11.3(A) 10.6(L)  Hematocrit 36.0 - 46.0 % 34.4(L) 36 32.8(L)  Platelets 150.0 - 400.0 K/uL 280.0 303 237   Stable. Will monitor, especially while on Xarelto.

## 2018-09-04 NOTE — Assessment & Plan Note (Signed)
Followed by Rheumatology. Medications include Arava, Plaquenil, and Prednisone. Pain control with Voltaren and Tramadol.

## 2018-09-04 NOTE — Assessment & Plan Note (Signed)
Doing well. Discontinue Xarelto around 01/2019.

## 2018-09-04 NOTE — Assessment & Plan Note (Signed)
Followed by Cardiology. Cardiovascular ROS: no chest pain or dyspnea on exertion. Compliant with medications. Edema improved.

## 2018-09-04 NOTE — Assessment & Plan Note (Signed)
Diabetic ROS - medication compliance: compliant most of the time, diabetic diet compliance: compliant most of the time, home glucose monitoring: is not performed.  New concerns: doing well, home and exercising with PT.   Lab review: orders written for new lab studies as appropriate; see orders.  Assessment: Diabetes Mellitus: stable.  Plan: See orders for this visit as documented in the electronic medical record. Diabetic issues reviewed with her: all medications, side effects and compliance discussed carefully, diabetic Sick Day rules reviewed, handout given, foot care discussed and Podiatry visits discussed and annual eye examinations at Ophthalmology discussed.

## 2018-09-04 NOTE — Assessment & Plan Note (Addendum)
Lab Results  Component Value Date   CREATININE 0.97 08/29/2018   CREATININE 1.4 (A) 08/08/2018   CREATININE 1.4 (A) 08/01/2018   Improved. Will continue to monitor.

## 2018-09-04 NOTE — Assessment & Plan Note (Signed)
No nodules or flares. On Allopurinol.

## 2018-09-05 ENCOUNTER — Encounter (HOSPITAL_COMMUNITY): Payer: Self-pay | Admitting: Family Medicine

## 2018-09-05 ENCOUNTER — Emergency Department (HOSPITAL_COMMUNITY): Payer: Medicare Other

## 2018-09-05 ENCOUNTER — Observation Stay (HOSPITAL_COMMUNITY)
Admission: EM | Admit: 2018-09-05 | Discharge: 2018-09-07 | Disposition: A | Discharge status: Home / Self Care | Payer: Medicare Other | Attending: Internal Medicine | Admitting: Internal Medicine

## 2018-09-05 ENCOUNTER — Other Ambulatory Visit: Payer: Self-pay

## 2018-09-05 ENCOUNTER — Telehealth: Payer: Self-pay

## 2018-09-05 ENCOUNTER — Ambulatory Visit: Payer: Self-pay | Admitting: *Deleted

## 2018-09-05 DIAGNOSIS — M25512 Pain in left shoulder: Secondary | ICD-10-CM

## 2018-09-05 DIAGNOSIS — Z9181 History of falling: Secondary | ICD-10-CM

## 2018-09-05 DIAGNOSIS — I2510 Atherosclerotic heart disease of native coronary artery without angina pectoris: Secondary | ICD-10-CM | POA: Diagnosis present

## 2018-09-05 DIAGNOSIS — M1A09X1 Idiopathic chronic gout, multiple sites, with tophus (tophi): Secondary | ICD-10-CM | POA: Insufficient documentation

## 2018-09-05 DIAGNOSIS — Z79899 Other long term (current) drug therapy: Secondary | ICD-10-CM | POA: Insufficient documentation

## 2018-09-05 DIAGNOSIS — E559 Vitamin D deficiency, unspecified: Secondary | ICD-10-CM | POA: Diagnosis not present

## 2018-09-05 DIAGNOSIS — Z885 Allergy status to narcotic agent status: Secondary | ICD-10-CM | POA: Insufficient documentation

## 2018-09-05 DIAGNOSIS — R55 Syncope and collapse: Secondary | ICD-10-CM | POA: Diagnosis present

## 2018-09-05 DIAGNOSIS — Z7901 Long term (current) use of anticoagulants: Secondary | ICD-10-CM | POA: Diagnosis not present

## 2018-09-05 DIAGNOSIS — J45909 Unspecified asthma, uncomplicated: Secondary | ICD-10-CM | POA: Diagnosis not present

## 2018-09-05 DIAGNOSIS — Z8673 Personal history of transient ischemic attack (TIA), and cerebral infarction without residual deficits: Secondary | ICD-10-CM | POA: Insufficient documentation

## 2018-09-05 DIAGNOSIS — D638 Anemia in other chronic diseases classified elsewhere: Secondary | ICD-10-CM | POA: Insufficient documentation

## 2018-09-05 DIAGNOSIS — E1129 Type 2 diabetes mellitus with other diabetic kidney complication: Secondary | ICD-10-CM | POA: Diagnosis present

## 2018-09-05 DIAGNOSIS — I509 Heart failure, unspecified: Secondary | ICD-10-CM

## 2018-09-05 DIAGNOSIS — I491 Atrial premature depolarization: Secondary | ICD-10-CM | POA: Diagnosis not present

## 2018-09-05 DIAGNOSIS — Z881 Allergy status to other antibiotic agents status: Secondary | ICD-10-CM | POA: Diagnosis not present

## 2018-09-05 DIAGNOSIS — E785 Hyperlipidemia, unspecified: Secondary | ICD-10-CM | POA: Diagnosis not present

## 2018-09-05 DIAGNOSIS — E1121 Type 2 diabetes mellitus with diabetic nephropathy: Secondary | ICD-10-CM | POA: Diagnosis present

## 2018-09-05 DIAGNOSIS — Z23 Encounter for immunization: Secondary | ICD-10-CM | POA: Insufficient documentation

## 2018-09-05 DIAGNOSIS — E1159 Type 2 diabetes mellitus with other circulatory complications: Secondary | ICD-10-CM | POA: Diagnosis present

## 2018-09-05 DIAGNOSIS — E876 Hypokalemia: Secondary | ICD-10-CM | POA: Diagnosis not present

## 2018-09-05 DIAGNOSIS — I2699 Other pulmonary embolism without acute cor pulmonale: Secondary | ICD-10-CM | POA: Diagnosis present

## 2018-09-05 DIAGNOSIS — N183 Chronic kidney disease, stage 3 unspecified: Secondary | ICD-10-CM | POA: Diagnosis present

## 2018-09-05 DIAGNOSIS — I130 Hypertensive heart and chronic kidney disease with heart failure and stage 1 through stage 4 chronic kidney disease, or unspecified chronic kidney disease: Secondary | ICD-10-CM | POA: Insufficient documentation

## 2018-09-05 DIAGNOSIS — I252 Old myocardial infarction: Secondary | ICD-10-CM | POA: Insufficient documentation

## 2018-09-05 DIAGNOSIS — I152 Hypertension secondary to endocrine disorders: Secondary | ICD-10-CM | POA: Diagnosis present

## 2018-09-05 DIAGNOSIS — Z86711 Personal history of pulmonary embolism: Secondary | ICD-10-CM | POA: Diagnosis not present

## 2018-09-05 DIAGNOSIS — Z743 Need for continuous supervision: Secondary | ICD-10-CM | POA: Diagnosis not present

## 2018-09-05 DIAGNOSIS — I5043 Acute on chronic combined systolic (congestive) and diastolic (congestive) heart failure: Secondary | ICD-10-CM | POA: Diagnosis not present

## 2018-09-05 DIAGNOSIS — I10 Essential (primary) hypertension: Secondary | ICD-10-CM

## 2018-09-05 DIAGNOSIS — M6281 Muscle weakness (generalized): Secondary | ICD-10-CM | POA: Diagnosis not present

## 2018-09-05 DIAGNOSIS — I69928 Other speech and language deficits following unspecified cerebrovascular disease: Secondary | ICD-10-CM

## 2018-09-05 DIAGNOSIS — M797 Fibromyalgia: Secondary | ICD-10-CM | POA: Insufficient documentation

## 2018-09-05 DIAGNOSIS — E1169 Type 2 diabetes mellitus with other specified complication: Secondary | ICD-10-CM

## 2018-09-05 DIAGNOSIS — E1122 Type 2 diabetes mellitus with diabetic chronic kidney disease: Secondary | ICD-10-CM | POA: Diagnosis not present

## 2018-09-05 DIAGNOSIS — Z961 Presence of intraocular lens: Secondary | ICD-10-CM | POA: Diagnosis not present

## 2018-09-05 DIAGNOSIS — Z7984 Long term (current) use of oral hypoglycemic drugs: Secondary | ICD-10-CM

## 2018-09-05 DIAGNOSIS — M25511 Pain in right shoulder: Secondary | ICD-10-CM

## 2018-09-05 DIAGNOSIS — G8929 Other chronic pain: Secondary | ICD-10-CM

## 2018-09-05 DIAGNOSIS — M069 Rheumatoid arthritis, unspecified: Secondary | ICD-10-CM | POA: Diagnosis not present

## 2018-09-05 DIAGNOSIS — I420 Dilated cardiomyopathy: Secondary | ICD-10-CM | POA: Diagnosis not present

## 2018-09-05 DIAGNOSIS — E860 Dehydration: Principal | ICD-10-CM | POA: Insufficient documentation

## 2018-09-05 DIAGNOSIS — M109 Gout, unspecified: Secondary | ICD-10-CM

## 2018-09-05 DIAGNOSIS — I5042 Chronic combined systolic (congestive) and diastolic (congestive) heart failure: Secondary | ICD-10-CM | POA: Insufficient documentation

## 2018-09-05 DIAGNOSIS — R42 Dizziness and giddiness: Secondary | ICD-10-CM | POA: Diagnosis not present

## 2018-09-05 DIAGNOSIS — Z96653 Presence of artificial knee joint, bilateral: Secondary | ICD-10-CM | POA: Diagnosis not present

## 2018-09-05 DIAGNOSIS — D631 Anemia in chronic kidney disease: Secondary | ICD-10-CM

## 2018-09-05 DIAGNOSIS — I5023 Acute on chronic systolic (congestive) heart failure: Secondary | ICD-10-CM | POA: Diagnosis not present

## 2018-09-05 DIAGNOSIS — M1991 Primary osteoarthritis, unspecified site: Secondary | ICD-10-CM

## 2018-09-05 DIAGNOSIS — Z7952 Long term (current) use of systemic steroids: Secondary | ICD-10-CM

## 2018-09-05 DIAGNOSIS — I959 Hypotension, unspecified: Secondary | ICD-10-CM | POA: Diagnosis not present

## 2018-09-05 DIAGNOSIS — R5381 Other malaise: Secondary | ICD-10-CM

## 2018-09-05 DIAGNOSIS — D649 Anemia, unspecified: Secondary | ICD-10-CM

## 2018-09-05 LAB — CBC WITH DIFFERENTIAL/PLATELET
Abs Immature Granulocytes: 0.07 10*3/uL (ref 0.00–0.07)
Basophils Absolute: 0.1 10*3/uL (ref 0.0–0.1)
Basophils Relative: 1 %
Eosinophils Absolute: 0.1 10*3/uL (ref 0.0–0.5)
Eosinophils Relative: 1 %
HCT: 36 % (ref 36.0–46.0)
Hemoglobin: 11.2 g/dL — ABNORMAL LOW (ref 12.0–15.0)
Immature Granulocytes: 1 %
Lymphocytes Relative: 31 %
Lymphs Abs: 2.7 10*3/uL (ref 0.7–4.0)
MCH: 29.6 pg (ref 26.0–34.0)
MCHC: 31.1 g/dL (ref 30.0–36.0)
MCV: 95.2 fL (ref 80.0–100.0)
Monocytes Absolute: 0.7 10*3/uL (ref 0.1–1.0)
Monocytes Relative: 9 %
Neutro Abs: 5 10*3/uL (ref 1.7–7.7)
Neutrophils Relative %: 57 %
Platelets: 276 10*3/uL (ref 150–400)
RBC: 3.78 MIL/uL — ABNORMAL LOW (ref 3.87–5.11)
RDW: 19.9 % — ABNORMAL HIGH (ref 11.5–15.5)
WBC: 8.6 10*3/uL (ref 4.0–10.5)
nRBC: 0 % (ref 0.0–0.2)

## 2018-09-05 LAB — BASIC METABOLIC PANEL
Anion gap: 9 (ref 5–15)
BUN: 32 mg/dL — ABNORMAL HIGH (ref 8–23)
CO2: 26 mmol/L (ref 22–32)
Calcium: 8.8 mg/dL — ABNORMAL LOW (ref 8.9–10.3)
Chloride: 107 mmol/L (ref 98–111)
Creatinine, Ser: 1 mg/dL (ref 0.44–1.00)
GFR calc Af Amer: 57 mL/min — ABNORMAL LOW (ref >60)
GFR calc non Af Amer: 50 mL/min — ABNORMAL LOW (ref >60)
Glucose, Bld: 86 mg/dL (ref 70–99)
Potassium: 3 mmol/L — ABNORMAL LOW (ref 3.5–5.1)
Sodium: 142 mmol/L (ref 135–145)

## 2018-09-05 LAB — MAGNESIUM: Magnesium: 1.8 mg/dL (ref 1.7–2.4)

## 2018-09-05 LAB — TROPONIN I
Troponin I: 0.04 ng/mL (ref <0.03)
Troponin I: 0.04 ng/mL (ref <0.03)

## 2018-09-05 LAB — GLUCOSE, CAPILLARY: Glucose-Capillary: 89 mg/dL (ref 70–99)

## 2018-09-05 MED ORDER — POLYVINYL ALCOHOL 1.4 % OP SOLN
1.0000 [drp] | Freq: Every day | OPHTHALMIC | Status: DC
  Administered 2018-09-06 – 2018-09-07 (×2): 1 [drp] via OPHTHALMIC
  Filled 2018-09-05: qty 15

## 2018-09-05 MED ORDER — TRIAMCINOLONE ACETONIDE 0.1 % EX CREA
1.0000 "application " | TOPICAL_CREAM | Freq: Two times a day (BID) | CUTANEOUS | Status: DC | PRN
  Filled 2018-09-05: qty 15

## 2018-09-05 MED ORDER — CARVEDILOL 6.25 MG PO TABS
6.2500 mg | ORAL_TABLET | Freq: Two times a day (BID) | ORAL | Status: DC
  Administered 2018-09-05 – 2018-09-07 (×4): 6.25 mg via ORAL
  Filled 2018-09-05 (×4): qty 1

## 2018-09-05 MED ORDER — ENSURE ENLIVE PO LIQD
237.0000 mL | Freq: Two times a day (BID) | ORAL | Status: DC
  Administered 2018-09-06: 237 mL via ORAL

## 2018-09-05 MED ORDER — HYDRALAZINE HCL 50 MG PO TABS
100.0000 mg | ORAL_TABLET | Freq: Three times a day (TID) | ORAL | Status: DC
  Administered 2018-09-05 – 2018-09-07 (×6): 100 mg via ORAL
  Filled 2018-09-05 (×7): qty 2

## 2018-09-05 MED ORDER — TRAMADOL HCL 50 MG PO TABS: 50.0000 mg | ORAL_TABLET | Freq: Four times a day (QID) | ORAL | Status: DC | PRN

## 2018-09-05 MED ORDER — CALCIUM CARBONATE-VITAMIN D 500-200 MG-UNIT PO TABS: 1.0000 | ORAL_TABLET | Freq: Every day | ORAL | Status: DC

## 2018-09-05 MED ORDER — INFLUENZA VAC SPLIT QUAD 0.5 ML IM SUSY
0.5000 mL | PREFILLED_SYRINGE | INTRAMUSCULAR | Status: AC
  Administered 2018-09-07: 0.5 mL via INTRAMUSCULAR
  Filled 2018-09-05: qty 0.5

## 2018-09-05 MED ORDER — INSULIN ASPART 100 UNIT/ML ~~LOC~~ SOLN
0.0000 [IU] | Freq: Three times a day (TID) | SUBCUTANEOUS | Status: DC
  Administered 2018-09-06: 2 [IU] via SUBCUTANEOUS
  Administered 2018-09-06 (×2): 1 [IU] via SUBCUTANEOUS
  Administered 2018-09-07: 2 [IU] via SUBCUTANEOUS
  Administered 2018-09-07: 1 [IU] via SUBCUTANEOUS

## 2018-09-05 MED ORDER — SACUBITRIL-VALSARTAN 49-51 MG PO TABS
1.0000 | ORAL_TABLET | Freq: Every day | ORAL | Status: DC
  Administered 2018-09-06 – 2018-09-07 (×2): 1 via ORAL
  Filled 2018-09-05 (×2): qty 1

## 2018-09-05 MED ORDER — TRAMADOL HCL 50 MG PO TABS
50.0000 mg | ORAL_TABLET | Freq: Four times a day (QID) | ORAL | Status: DC | PRN
  Administered 2018-09-05 – 2018-09-07 (×3): 50 mg via ORAL
  Filled 2018-09-05 (×3): qty 1

## 2018-09-05 MED ORDER — GABAPENTIN 100 MG PO CAPS
100.0000 mg | ORAL_CAPSULE | Freq: Every day | ORAL | Status: DC
  Administered 2018-09-05 – 2018-09-06 (×2): 100 mg via ORAL
  Filled 2018-09-05 (×2): qty 1

## 2018-09-05 MED ORDER — VITAMIN C 500 MG PO TABS
500.0000 mg | ORAL_TABLET | Freq: Every day | ORAL | Status: DC
  Administered 2018-09-06 – 2018-09-07 (×2): 500 mg via ORAL
  Filled 2018-09-05 (×2): qty 1

## 2018-09-05 MED ORDER — OLOPATADINE HCL 0.1 % OP SOLN
1.0000 [drp] | Freq: Two times a day (BID) | OPHTHALMIC | Status: DC
  Administered 2018-09-06 – 2018-09-07 (×3): 1 [drp] via OPHTHALMIC
  Filled 2018-09-05: qty 5

## 2018-09-05 MED ORDER — LEFLUNOMIDE 20 MG PO TABS
20.0000 mg | ORAL_TABLET | Freq: Every day | ORAL | Status: DC
  Administered 2018-09-06 – 2018-09-07 (×2): 20 mg via ORAL
  Filled 2018-09-05 (×2): qty 1

## 2018-09-05 MED ORDER — HYPROMELLOSE (GONIOSCOPIC) 2.5 % OP SOLN
1.0000 [drp] | Freq: Every day | OPHTHALMIC | Status: DC
  Filled 2018-09-05: qty 15

## 2018-09-05 MED ORDER — IOPAMIDOL (ISOVUE-370) INJECTION 76%
100.0000 mL | Freq: Once | INTRAVENOUS | Status: AC | PRN
  Administered 2018-09-05: 100 mL via INTRAVENOUS

## 2018-09-05 MED ORDER — ISOSORBIDE MONONITRATE ER 30 MG PO TB24
30.0000 mg | ORAL_TABLET | Freq: Every day | ORAL | Status: DC
  Administered 2018-09-05 – 2018-09-06 (×2): 30 mg via ORAL
  Filled 2018-09-05 (×2): qty 1

## 2018-09-05 MED ORDER — PREDNISONE 20 MG PO TABS
10.0000 mg | ORAL_TABLET | Freq: Every day | ORAL | Status: DC
  Administered 2018-09-06 – 2018-09-07 (×2): 10 mg via ORAL
  Filled 2018-09-05 (×2): qty 1

## 2018-09-05 MED ORDER — HYDROXYCHLOROQUINE SULFATE 200 MG PO TABS
200.0000 mg | ORAL_TABLET | Freq: Two times a day (BID) | ORAL | Status: DC
  Administered 2018-09-05 – 2018-09-07 (×4): 200 mg via ORAL
  Filled 2018-09-05 (×5): qty 1

## 2018-09-05 MED ORDER — ALBUTEROL SULFATE (2.5 MG/3ML) 0.083% IN NEBU: 2.5000 mg | INHALATION_SOLUTION | Freq: Four times a day (QID) | RESPIRATORY_TRACT | Status: DC | PRN

## 2018-09-05 MED ORDER — VITAMIN D3 25 MCG (1000 UNIT) PO TABS
1000.0000 [IU] | ORAL_TABLET | Freq: Every day | ORAL | Status: DC
  Administered 2018-09-06 – 2018-09-07 (×2): 1000 [IU] via ORAL
  Filled 2018-09-05 (×2): qty 1

## 2018-09-05 MED ORDER — HYPROMELLOSE (GONIOSCOPIC) 2.5 % OP SOLN: 1.0000 [drp] | Freq: Every day | OPHTHALMIC | Status: DC

## 2018-09-05 MED ORDER — SODIUM CHLORIDE 0.9% FLUSH
3.0000 mL | Freq: Two times a day (BID) | INTRAVENOUS | Status: DC
  Administered 2018-09-05 – 2018-09-06 (×2): 3 mL via INTRAVENOUS

## 2018-09-05 MED ORDER — FERROUS SULFATE 325 (65 FE) MG PO TABS
325.0000 mg | ORAL_TABLET | Freq: Two times a day (BID) | ORAL | Status: DC
  Administered 2018-09-06 – 2018-09-07 (×3): 325 mg via ORAL
  Filled 2018-09-05 (×3): qty 1

## 2018-09-05 MED ORDER — RIVAROXABAN 20 MG PO TABS
20.0000 mg | ORAL_TABLET | ORAL | Status: AC
  Administered 2018-09-05: 20 mg via ORAL
  Filled 2018-09-05: qty 1

## 2018-09-05 MED ORDER — ALLOPURINOL 100 MG PO TABS
100.0000 mg | ORAL_TABLET | Freq: Every day | ORAL | Status: DC
  Administered 2018-09-05 – 2018-09-06 (×2): 100 mg via ORAL
  Filled 2018-09-05 (×2): qty 1

## 2018-09-05 MED ORDER — POTASSIUM CHLORIDE CRYS ER 20 MEQ PO TBCR
60.0000 meq | EXTENDED_RELEASE_TABLET | Freq: Once | ORAL | Status: AC
  Administered 2018-09-05: 60 meq via ORAL
  Filled 2018-09-05: qty 3

## 2018-09-05 MED ORDER — DICLOFENAC SODIUM 1 % TD GEL
1.0000 "application " | Freq: Every day | TRANSDERMAL | Status: DC
  Administered 2018-09-07: 1 via TOPICAL
  Filled 2018-09-05: qty 100

## 2018-09-05 MED ORDER — RIVAROXABAN 20 MG PO TABS
20.0000 mg | ORAL_TABLET | Freq: Every day | ORAL | Status: DC
  Administered 2018-09-06: 20 mg via ORAL
  Filled 2018-09-05: qty 1

## 2018-09-05 NOTE — ED Notes (Signed)
ED TO INPATIENT HANDOFF REPORT  Name/Age/Gender [name] [name] 84 y.o. female  Code Status Code Status History    Date Active Date Inactive Code Status Order ID Comments User Context   07/24/2018 0208 07/27/2018 1929 Full Code [id number]  Vianne Bulls, MD ED   06/23/2018 0451 06/27/2018 2316 Full Code [id number]  Rise Patience, MD Inpatient   05/28/2018 2345 06/04/2018 2356 Full Code [id number]  Harvie Bridge, DO Inpatient   02/02/2018 0606 02/04/2018 1957 DNR [id number]  Norval Morton, MD ED   11/26/2017 2055 11/29/2017 1715 DNR [id number]  Elodia Florence., MD Inpatient   05/25/2017 1811 05/27/2017 1956 DNR [id number]  Caren Griffins, MD Inpatient   11/29/2016 1522 12/01/2016 1509 Full Code [id number]  Elwin Mocha, MD ED   09/11/2015 0454 09/12/2015 1432 Full Code [id number]  Etta Quill, DO ED   06/19/2015 2338 06/21/2015 1941 Full Code [id number]  Etta Quill, DO ED   01/25/2014 2245 06/19/2015 2338 Full Code [id number]  Hennie Duos, MD Outpatient   01/17/2014 1305 01/22/2014 2142 Full Code [id number]  Allie Bossier, MD ED    Advance Directive Documentation     Most Recent Value  Type of Advance Directive  Healthcare Power of Attorney, Living will  Pre-existing out of facility DNR order (yellow form or pink MOST form)  -  "MOST" Form in Place?  -      Home/SNF/Other Home  Chief Complaint Retaguardia  Level of Care/Admitting Diagnosis ED Disposition    ED Disposition Condition [place]: [facility] [100102]  Level of Care: Telemetry [5]  Admit to tele based on following criteria: Other see comments  Comments: syncope  Diagnosis: Syncope [206001]  Admitting Physician: Shela Leff [[id number]]  Attending Physician: Shela Leff [[id number]]  PT Class (Do Not Modify): Observation [104]  PT Acc Code (Do Not Modify): Observation [10022]       Medical History Past Medical History:  Diagnosis Date  .  Allergic rhinitis due to pollen 04/27/2007  . Arthritis    "in q joint" (08/07/2013)  . CHF (congestive heart failure) ([place])   . Coronary atherosclerosis of native coronary artery   . Cough   . Disorder of bone and cartilage, unspecified   . Edema 05/03/2013  . Exertional shortness of breath    "sometimes" (08/07/2013)  . First degree atrioventricular block   . Gout, unspecified 04/19/2013  . Hyperlipidemia 04/27/2007  . Hypertension   . Muscle spasm   . Muscle weakness (generalized)   . Myocardial infarction ([place]) ~ 1970  . Onychia and paronychia of toe   . Osteoarthrosis, unspecified whether generalized or localized, unspecified site 04/27/2007  . Other fall   . Other malaise and fatigue   . Pneumonia 05/2018  . Prepatellar bursitis   . Pulmonary embolism ([place]) 07/2018  . Seizures ([place])   . Stroke ([place]) 01/17/2014  . TIA (transient ischemic attack)    "a few one summer" (08/07/2013)  . Type II diabetes mellitus ([place])    "fasting 90-110s" (08/07/2013)  . Unspecified essential hypertension 08/08/2013  . Unspecified vitamin D deficiency     Allergies Allergies  Allergen Reactions  . Clonidine Derivatives Swelling    [name]  . Fish Allergy Diarrhea, Swelling and Other (See Comments)    Turns skin "black," but can tolerate white fish Salmon- Diarrhea  . Shellfish Allergy Hives  . Doxycycline  Rash  . Indomethacin Other (See Comments)    Reaction not recalled by the patient  . Lyrica [Pregabalin] Other (See Comments)    Hallucinations   . Methyldopa Other (See Comments)    Aldomet (for hypertension): Reaction not recalled by the patient  . Morphine And Related Other (See Comments)    Family reports it drops her bp that she needs iv fluids  . Orange Fruit [Citrus] Other (See Comments)    Indigestion/heartburn  . Strawberry (Diagnostic) Itching  . Cetirizine Hcl Itching and Rash  . Codeine Itching   . Levaquin [Levofloxacin In D5w] Rash  . Tomato Rash    IV Location/Drains/Wounds Patient Lines/Drains/Airways Status   Active Line/Drains/Airways    Name:   Placement date:   Placement time:   Site:   Days:   Peripheral IV 09/05/18 Left Forearm   09/05/18    1535    Forearm   less than 1   External Urinary Catheter   07/24/18    0330    -   43   Incision (Closed) 06/26/18 Abdomen Other (Comment)   06/26/18    0839     71   Incision - 4 Ports Abdomen Umbilicus Right;Medial Right;Lateral Upper;Medial   06/26/18    0822     71   Wound / Incision (Open or Dehisced) 06/23/18 Non-pressure wound Buttocks Left small, round puncture  wound around 3 mm   06/23/18    0340    Buttocks   74          Labs/Imaging Results for orders placed or performed during the hospital encounter of 09/05/18 (from the past 48 hour(s))  Troponin I     Status: Abnormal   Collection Time: 09/05/18  3:38 PM  Result Value Ref Range   Troponin I 0.04 (HH) <0.03 ng/mL    Comment: CRITICAL RESULT CALLED TO, READ BACK BY AND VERIFIED WITH: Tsosie Billing  Performed at [facility], [place] [address], [place], [place] [address]   Basic metabolic panel     Status: Abnormal   Collection Time: 09/05/18  3:38 PM  Result Value Ref Range   Sodium 142 135 - 145 mmol/L   Potassium 3.0 (L) 3.5 - 5.1 mmol/L   Chloride 107 98 - 111 mmol/L   CO2 26 22 - 32 mmol/L   Glucose, Bld 86 70 - 99 mg/dL   BUN 32 (H) 8 - 23 mg/dL   Creatinine, Ser 1.00 0.44 - 1.00 mg/dL   Calcium 8.8 (L) 8.9 - 10.3 mg/dL   GFR calc non Af Amer 50 (L) >60 mL/min   GFR calc Af Amer 57 (L) >60 mL/min    Comment: (NOTE) The eGFR has been calculated using the CKD EPI equation. This calculation has not been validated in all clinical situations. eGFR's persistently <60 mL/min signify possible Chronic Kidney Disease.    Anion gap 9 5 - 15    Comment: Performed at [facility], [place] [address], [place], [place]  [address]  CBC with Differential     Status: Abnormal   Collection Time: 09/05/18  3:38 PM  Result Value Ref Range   WBC 8.6 4.0 - 10.5 K/uL   RBC 3.78 (L) 3.87 - 5.11 MIL/uL   Hemoglobin 11.2 (L) 12.0 - 15.0 g/dL   HCT 36.0 36.0 - 46.0 %   MCV 95.2 80.0 - 100.0 fL   MCH 29.6 26.0 - 34.0 pg   MCHC 31.1 30.0 - 36.0  g/dL   RDW 19.9 (H) 11.5 - 15.5 %   Platelets 276 150 - 400 K/uL   nRBC 0.0 0.0 - 0.2 %   Neutrophils Relative % 57 %   Neutro Abs 5.0 1.7 - 7.7 K/uL   Lymphocytes Relative 31 %   Lymphs Abs 2.7 0.7 - 4.0 K/uL   Monocytes Relative 9 %   Monocytes Absolute 0.7 0.1 - 1.0 K/uL   Eosinophils Relative 1 %   Eosinophils Absolute 0.1 0.0 - 0.5 K/uL   Basophils Relative 1 %   Basophils Absolute 0.1 0.0 - 0.1 K/uL   Immature Granulocytes 1 %   Abs Immature Granulocytes 0.07 0.00 - 0.07 K/uL    Comment: Performed at [facility], [place] [address], [place], [place] [address]   Ct Angio Chest Pe W And/or Wo Contrast  Result Date: 09/05/2018 CLINICAL DATA:  Weakness with bradycardia.  Syncopal episode. EXAM: CT ANGIOGRAPHY CHEST WITH CONTRAST TECHNIQUE: Multidetector CT imaging of the chest was performed using the standard protocol during bolus administration of intravenous contrast. Multiplanar CT image reconstructions and MIPs were obtained to evaluate the vascular anatomy. CONTRAST:  135m ISOVUE-370 IOPAMIDOL (ISOVUE-370) INJECTION 76% COMPARISON:  07/24/2018 FINDINGS: Cardiovascular: Clearing of previously noted right lower lobe pulmonary emboli. No new pulmonary embolus is identified. Stable cardiomegaly without significant pericardial effusion or thickening. Left main and three-vessel coronary arteriosclerosis is noted. Nonaneurysmal minimally atherosclerotic aorta. Common origin of the brachiocephalic and left common carotid arteries. Mediastinum/Nodes: Small subcentimeter cystic nodules of the thyroid gland. No adenopathy. Patent trachea and mainstem bronchi.  Esophagus is unremarkable. Lungs/Pleura: Minimal atelectasis and/or scarring at each lung base medially and along the dependent aspect. No pulmonary consolidation, overt pulmonary edema or pneumothorax. No dominant mass. Subpleural 5 mm right middle lobe pulmonary nodule is stable, series 10/74. Upper Abdomen: Small hiatal hernia. Water attenuating cyst of the right kidney partially included measuring up to 5.5 cm. Cholecystectomy clips are noted. Musculoskeletal: Remodeled appearance about the glenohumeral joints consistent with advanced osteoarthritic change. Degenerative changes are stable along the dorsal spine. Review of the MIP images confirms the above findings. IMPRESSION: 1. Stable cardiomegaly with coronary arteriosclerosis and aortic atherosclerosis. 2. Clearing of previously noted right lower lobe pulmonary emboli. No new pulmonary embolus identified. 3. Small subcentimeter cystic thyroid nodules. 4. Stable 5 mm right middle lobe subpleural nodule. 5. Partially included 5.5 cm cyst of the right kidney without complicating features noted of that which was included. Aortic Atherosclerosis (ICD10-I70.0). Electronically Signed   By: DAshley RoyaltyM.D.   On: 09/05/2018 18:05    Pending Labs Unresulted Labs (From admission, onward)   None      Vitals/Pain Today's Vitals   09/05/18 1645 09/05/18 1700 09/05/18 1840 09/05/18 1900  BP:  (!) 178/112 (!) 144/79 128/77  Pulse: (!) 29 (!) 31 87 94  Resp: 14 17 18 12   SpO2: 97% 95% 99% 98%  Weight:      Height:      PainSc:        Isolation Precautions No active isolations  Medications Medications  iopamidol (ISOVUE-370) 76 % injection 100 mL (100 mLs Intravenous Contrast Given 09/05/18 1731)    Mobility walks

## 2018-09-05 NOTE — ED Triage Notes (Signed)
Patient is from home and transported via [facility]. Patient had a syncopal episode earlier today. Patient was evaluated by EMS and recommended to contact PCP since patient refused transported to the emergency department. Patient called PCP today and recommended to follow up in the emergency department. When EMS arrived, patients heart rate was in the 40's. Now on arrival patients HR is normal sinus with PVC.

## 2018-09-05 NOTE — ED Provider Notes (Signed)
I received this patient in signout from Dr. Rubin Payor.  She had presented with a syncopal episode and we were awaiting a CTA to rule out PE as she had a history of the same.  CTA was overall reassuring, previous PE was cleared and no new PE noted.  No pleural effusion or infiltrates.  Her troponin is borderline elevated at 0.04 but it appears it has been this way previously.  She is without complaints on reassessment and stable vital signs.  Discussed admission with [facility] hospitalist for syncope work-up and patient admitted for further care.  Family updated on plan.   [name] [name], Ambrose Finland, MD 09/05/18 [phone]

## 2018-09-05 NOTE — ED Provider Notes (Signed)
[facility]-EMERGENCY DEPT Provider Note   CSN: [id number] Arrival date & time: 09/05/18  1341     History   Chief Complaint Chief Complaint  Patient presents with  . Bradycardia    HPI [name] [name] is a 84 y.o. female.  HPI Patient presents after syncopal episode.  Had had a good morning and had breakfast and talk to the family members.  Then went to the bathroom and had a syncopal episode.  Reportedly looked real pale.  Had weakness for a few minutes.  Patient does not remember it and was unconscious.  Reportedly had a heart rate in the 40s for EMS.  Still feels a little weak but no chest pain.  Normal heart rate now.  States she had passed out previously when she had a pulmonary embolism.  Currently on anticoagulation.  Does have a history of CHF.  No chest pain or shortness of breath. Past Medical History:  Diagnosis Date  . Allergic rhinitis due to pollen 04/27/2007  . Arthritis    "in q joint" (08/07/2013)  . CHF (congestive heart failure) ([facility])   . Coronary atherosclerosis of native coronary artery   . Cough   . Disorder of bone and cartilage, unspecified   . Edema 05/03/2013  . Exertional shortness of breath    "sometimes" (08/07/2013)  . First degree atrioventricular block   . Gout, unspecified 04/19/2013  . Hyperlipidemia 04/27/2007  . Hypertension   . Muscle spasm   . Muscle weakness (generalized)   . Myocardial infarction ([facility]) ~ 1970  . Onychia and paronychia of toe   . Osteoarthrosis, unspecified whether generalized or localized, unspecified site 04/27/2007  . Other fall   . Other malaise and fatigue   . Pneumonia 05/2018  . Prepatellar bursitis   . Pulmonary embolism ([facility]) 07/2018  . Seizures ([facility])   . Stroke ([facility]) 01/17/2014  . TIA (transient ischemic attack)    "a few one summer" (08/07/2013)  . Type II diabetes mellitus ([facility])    "fasting 90-110s" (08/07/2013)  . Unspecified essential hypertension 08/08/2013  . Unspecified  vitamin D deficiency     Patient Active Problem List   Diagnosis Date Noted  . Refusal of statin medication by patient 09/04/2018  . Deep vein thrombosis (DVT) of distal vein of right lower extremity ([facility]) 07/30/2018  . Hypertensive heart disease with heart failure (H[facility]) 07/30/2018  . Pulmonary embolism ([facility]), 07/2018, on Xarelto with goal to DC after 6 months if more active 07/10/2018  . DCM (dilated cardiomyopathy) ([facility])   . Benign hypertensive heart and kidney disease with CHF and stage 3 chronic kidney disease ([facility]) 05/28/2018  . Osteoarthritis 05/06/2018  . Adhesive capsulitis of right shoulder 10/12/2017  . Idiopathic chronic gout of multiple sites with tophus 08/25/2017  . Rheumatic nodule 06/28/2017  . History of total knee arthroplasty, bilateral 02/18/2017  . History of rotator cuff surgery 02/18/2017  . Obesity (BMI 30.0-34.9) 12/08/2016  . High risk medications, long-term use 09/21/2016  . Hammer toe 10/31/2015  . Chronic combined systolic and diastolic CHF (congestive heart failure) (H[facility]) 09/12/2015  . Midline low back pain without sciatica 06/27/2014  . Bilateral edema of lower extremity 06/05/2014  . CKD stage 3 due to type 2 diabetes mellitus (H[facility]) 06/05/2014  . DM (diabetes mellitus), type 2 with renal complications (H[facility]) 06/05/2014    Class: Chronic  . Anemia of chronic disease 05/01/2014  . Fluctuating blood pressure 02/06/2014  . Stroke ([facility]) 01/17/2014  . Rheumatoid arthritis  involving multiple sites (H[facility]) 12/19/2013    Class: Chronic  . CAD (coronary artery disease) 10/18/2013  . Hypertension associated with diabetes (H[facility]) 08/08/2013    Class: Chronic  . Fibromyalgia 05/10/2013  . Reactive airway disease 04/19/2013  . Gout 04/19/2013  . Type 2 diabetes mellitus with hyperlipidemia ([facility]), patient declines statin 04/27/2007  . Allergic rhinitis 04/27/2007  . Diverticulosis 04/27/2007    Past Surgical History:  Procedure Laterality Date  . ABDOMINAL  HYSTERECTOMY  04/1980  . APPENDECTOMY  1960  . CARDIAC CATHETERIZATION  2003  . CATARACT EXTRACTION W/ INTRAOCULAR LENS  IMPLANT, BILATERAL Bilateral ~ 2012  . CHOLECYSTECTOMY N/A 06/26/2018   Procedure: LAPAROSCOPIC CHOLECYSTECTOMY POSSIBLE INTRAOPERATIVE CHOLANGIOGRAM;  Surgeon: Abigail Miyamoto, MD;  Location: [facility] OR;  Service: General;  Laterality: N/A;  . JOINT REPLACEMENT    . REPLACEMENT TOTAL KNEE Right 09/2005  . RIGHT/LEFT HEART CATH AND CORONARY ANGIOGRAPHY N/A 06/01/2018   Procedure: RIGHT/LEFT HEART CATH AND CORONARY ANGIOGRAPHY;  Surgeon: Corky Crafts, MD;  Location[facility] INVASIVE CV LAB;  Service: Cardiovascular;  Laterality: N/A;  . SHOULDER OPEN ROTATOR CUFF REPAIR Right 07/1999  . TONSILLECTOMY  08/1974  . TOTAL KNEE ARTHROPLASTY Left 08/07/2013   Procedure: TOTAL KNEE ARTHROPLASTY- LEFT;  Surgeon: Dannielle Huh, MD;  Location: [facility] OR;  Service: Orthopedics;  Laterality: Left;  . TRANSTHORACIC ECHOCARDIOGRAM  2003   EF 55-65%; mild concentric LVH     OB History   None      Home Medications    Prior to Admission medications   Medication Sig Start Date End Date Taking? Authorizing Provider  albuterol (PROVENTIL HFA;VENTOLIN HFA) 108 (90 Base) MCG/ACT inhaler Inhale 1-2 puffs into the lungs every 6 (six) hours as needed for wheezing or shortness of breath.     [provider]  allopurinol (ZYLOPRIM) 100 MG tablet Take 100 mg by mouth at bedtime.     [provider]  calcium-vitamin D (OSCAL WITH D) 500-200 MG-UNIT tablet Take 1 tablet by mouth daily with breakfast.    [provider]  carvedilol (COREG) 6.25 MG tablet Take 1 tablet (6.25 mg total) by mouth 2 (two) times daily with a meal. 07/27/18   Hongalgi, Maximino Greenland, MD  D3-1000 1000 units capsule Take 1,000 Units by mouth daily. 06/07/18   [provider]  diclofenac sodium (VOLTAREN) 1 % GEL Apply 1 application topically daily. Patient taking differently: Apply 1 application  topically See admin instructions. Apply to painful areas of shoulders and arms daily as directed 02/22/18   Helane Rima, DO  ENSURE (ENSURE) Take 237 mLs by mouth.    [provider]  ferrous sulfate 325 (65 FE) MG tablet Take 1 tablet (325 mg total) by mouth 2 (two) times daily with a meal. 06/04/18   Barnetta Chapel, MD  furosemide (LASIX) 40 MG tablet Take 1 tablet (40 mg total) by mouth daily. 06/28/18   Rhetta Mura, MD  gabapentin (NEURONTIN) 100 MG capsule Take 1 capsule (100 mg total) by mouth at bedtime. 05/19/18   Helane Rima, DO  hydrALAZINE (APRESOLINE) 100 MG tablet Take 1 tablet by mouth 3 (three) times daily. 08/05/18   [provider]  hydroxychloroquine (PLAQUENIL) 200 MG tablet Take 200 mg by mouth 2 (two) times daily.    [provider]  hydroxypropyl methylcellulose / hypromellose (ISOPTO TEARS / GONIOVISC) 2.5 % ophthalmic solution Place 1 drop into both eyes daily. 07/27/18   Hongalgi, Maximino Greenland, MD  isosorbide mononitrate (IMDUR) 30 MG  24 hr tablet Take 1 tablet (30 mg total) by mouth at bedtime. 07/27/18   Hongalgi, Maximino Greenland, MD  leflunomide (ARAVA) 20 MG tablet Take 20 mg by mouth daily.    [provider]  metFORMIN (GLUCOPHAGE) 500 MG tablet Take 1 tablet (500 mg total) by mouth daily with breakfast. 01/28/18   Helane Rima, DO  Olopatadine HCl 0.2 % SOLN Place 1 drop into both eyes daily. 07/27/18   Hongalgi, Maximino Greenland, MD  potassium chloride SA (K-DUR,KLOR-CON) 20 MEQ tablet Take 1 tablet (20 mEq total) by mouth 2 (two) times daily. 07/27/18   Hongalgi, Maximino Greenland, MD  predniSONE (DELTASONE) 10 MG tablet Take 1 tablet (10 mg total) by mouth daily with breakfast. 03/25/18   Helane Rima, DO  rivaroxaban (XARELTO) 20 MG TABS tablet Take 20 mg by mouth daily with supper.    [provider]  sacubitril-valsartan (ENTRESTO) 49-51 MG Take 1 tablet by mouth daily at 12 noon.     [provider]  spironolactone (ALDACTONE)  25 MG tablet Take 12.5 mg by mouth daily.    [provider]  traMADol (ULTRAM) 50 MG tablet Take 1 tablet by mouth every 6 (six) hours as needed. 08/25/18   [provider]  triamcinolone cream (KENALOG) 0.1 % Apply 1 application topically 2 (two) times daily as needed (for itching- to affected sites).     [provider]  vitamin C (ASCORBIC ACID) 500 MG tablet Take 500 mg by mouth daily.    [provider]    Family History Family History  Problem Relation Age of Onset  . Heart attack Father     Social History Social History   Tobacco Use  . Smoking status: Never Smoker  . Smokeless tobacco: Never Used  Substance Use Topics  . Alcohol use: No    Alcohol/week: 0.0 standard drinks  . Drug use: No     Allergies   Clonidine derivatives; Fish allergy; Shellfish allergy; Doxycycline; Indomethacin; Lyrica [pregabalin]; Methyldopa; Morphine and related; Orange fruit [citrus]; Strawberry (diagnostic); Cetirizine hcl; Codeine; Levaquin [levofloxacin in d5w]; and Tomato   Review of Systems Review of Systems  Constitutional: Negative for fever.  HENT: Negative for congestion.   Respiratory: Negative for shortness of breath.   Cardiovascular: Negative for chest pain.  Gastrointestinal: Negative for abdominal distention and nausea.  Genitourinary: Negative for flank pain.  Musculoskeletal: Negative for back pain.  Skin: Negative for pallor.  Neurological: Positive for syncope.  Hematological: Negative for adenopathy.  Psychiatric/Behavioral: Negative for confusion.     Physical Exam Updated Vital Signs BP (!) 156/58   Pulse (!) 28   Resp 17   Ht 5\' 4"  (1.626 m)   Wt 76.2 kg   SpO2 97%   BMI 28.84 kg/m   Physical Exam  Constitutional: She appears well-developed.  HENT:  Head: Normocephalic.  Eyes: Pupils are equal, round, and reactive to light.  Neck: Neck supple.  Cardiovascular: Normal rate.  Pulmonary/Chest: Effort normal.    Abdominal: Soft. There is no tenderness.  Musculoskeletal: She exhibits no tenderness.  Neurological: She is alert.  Skin: Skin is warm. Capillary refill takes less than 2 seconds.  Psychiatric: She has a normal mood and affect.     ED Treatments / Results  Labs (all labs ordered are listed, but only abnormal results are displayed) Labs Reviewed  CBC WITH DIFFERENTIAL/PLATELET - Abnormal; Notable for the following components:      Result Value   RBC 3.78 (*)  Hemoglobin 11.2 (*)    RDW 19.9 (*)    All other components within normal limits  TROPONIN I  BASIC METABOLIC PANEL    EKG EKG Interpretation  Date/Time:  Monday September 05 2018 14:12:50 EDT Ventricular Rate:  88 PR Interval:    QRS Duration: 91 QT Interval:  381 QTC Calculation: 461 R Axis:   -26 Text Interpretation:  Sinus rhythm Multiple ventricular premature complexes Abnormal R-wave progression, early transition Left ventricular hypertrophy Inferior infarct, old Anterior Q waves, possibly due to LVH pvcs are new Confirmed by Benjiman Core [phone]) on 09/05/2018 3:52:50 PM   Radiology No results found.  Procedures Procedures (including critical care time)  Medications Ordered in ED Medications - No data to display   Initial Impression / Assessment and Plan / ED Course  I have reviewed the triage vital signs and the nursing notes.  Pertinent labs & imaging results that were available during my care of the patient were reviewed by me and considered in my medical decision making (see chart for details).    Patient with syncopal episode.  Occurred while patient was in the bathroom.  Potentially could be vagal but states he has she had syncope with PE in the past.  Currently on anticoagulation.  Did not hit her head.  Will repeat CTA.  Reportedly had heart rates down in the 40s.  She had not been bradycardic here besides some PVCs.  However with her heart failure history and syncope she may benefit from  admission to the hospital.  Care will be turned over to Dr. Clarene Duke.  Final Clinical Impressions(s) / ED Diagnoses   Final diagnoses:  Syncope, unspecified syncope type    ED Discharge Orders    None       Benjiman Core, MD 09/05/18 [phone]

## 2018-09-05 NOTE — Telephone Encounter (Signed)
Pt in ED.  

## 2018-09-05 NOTE — Telephone Encounter (Signed)
See note

## 2018-09-05 NOTE — Telephone Encounter (Signed)
Certification received placed with charge sheet and med list in your folder.

## 2018-09-05 NOTE — Telephone Encounter (Signed)
Pt's daughter calling to report pt  "Passed out on commode as she had a month ago." States was straining "A little." LOC for 5 minutes "At least." Daughter states pt was hot, clammy. Pt was unresponsive, "Eyes rolling back, I thought we had lost her." Daughter called EMS who evaluated pt, reported to daughter vital signs were stable and "Fluid levels were ok."  Daughter states pt oriented, "Very weak, very tired." TN spoke to patient, "Just weak."   States EMS said they did not see need for ED transport, but to alert PCP.  Stated they would come back to transport. Attempted to reach office, unable to do so. Instructed pt and daughter to go to ED for thorough  evaluation. Daughter states she agrees with disposition and will call EMS to transport. Made aware TN would alert Dr. Earlene Plater to disposition. Reason for Disposition . [1] Fainted > 15 minutes ago AND [2] still feels weak or dizzy  Answer Assessment - Initial Assessment Questions 1. ONSET: "How long were you unconscious?" (minutes) "When did it happen?"     5 minutes "At least" 2. CONTENT: "What happened during period of unconsciousness?" (e.g., seizure activity)      Hot and clammy, "Eyes rolling back" 3. MENTAL STATUS: "Alert and oriented now?" (oriented x 3 = name, month, location)      yes 4. TRIGGER: "What do you think caused the fainting?" "What were you doing just before you fainted?"  (e.g., exercise, sudden standing up, prolonged standing)     Straining "A little" on commode 5. RECURRENT SYMPTOM: "Have you ever passed out before?" If so, ask: "When was the last time?" and "What happened that time?"      Yes, similar episode last month 6. INJURY: "Did you sustain any injury during the fall?"      no 7. CARDIAC SYMPTOMS: "Have you had any of the following symptoms: chest pain, difficulty breathing, palpitations?"      8. NEUROLOGIC SYMPTOMS: "Have you had any of the following symptoms: headache, numbness, vertigo, weakness?"    Very  weak, tired. 9. GI SYMPTOMS: "Have you had any of the following symptoms: abdominal pain, vomiting, diarrhea, blood in stools?"    "Some loose stools last few days."  10. OTHER SYMPTOMS: "Do you have any other symptoms?"       Very weak, tired  Protocols used: [facility]

## 2018-09-05 NOTE — H&P (Addendum)
History and Physical    [name] [name] RKY:[id number] DOB: 01/15/1930 DOA: 09/05/2018  PCP: Helane Rima, DO Patient coming from: Home  Chief Complaint: Syncope  HPI: [name] [name] is a 84 y.o. female with medical history significant of chronic combined systolic and diastolic CHF, hypertension, hyperlipidemia, coronary artery disease s/p MI, pulmonary embolism (September 2019) on Xarelto, seizures, stroke, type 2 diabetes, CKD stage III, rheumatoid arthritis presenting to the hospital for evaluation of syncope.  Patient states she was sitting on her commode this morning trying to have a bowel movement and all of a sudden felt hot and clammy.  States she is not constipated and was not really straining that hard.  States her daughter told her that she had lost consciousness for 3 to 4 minutes.  Family at bedside states patient's daughter held her during this episode, as such, she did not sustain any injuries.  Patient denies having any chest pain, shortness of breath, nausea, or vomiting at that time.  States her appetite and fluid intake have been good.  Family at bedside states patient did not have any slurring of speech, facial droop, or focal weakness.  She did not seem confused after regaining consciousness.  Family did not notice any seizure-like activity, tongue biting, or urinary incontinence.  Patient recalls another episode from a month ago where she had a syncopal episode sitting at the dining table.  ED Course: Normal heart rate recorded via cardiac monitor.  Blood pressure elevated with systolic in the 140s to 170s and diastolic in the 50s to 110s.  Labs showing no leukocytosis.  Hemoglobin 11.2 (at baseline).  Potassium 3.0.  I-STAT troponin 0.04.  EKG not suggestive of ACS.  CTA showing clearing of previously noted right lower lobe pulmonary emboli; no new pulmonary emboli identified.  TRH paged to admit.  Review of Systems: As per HPI otherwise 10 point review of systems  negative.  Past Medical History:  Diagnosis Date  . Allergic rhinitis due to pollen 04/27/2007  . Arthritis    "in q joint" (08/07/2013)  . CHF (congestive heart failure) (HCC)   . Coronary atherosclerosis of native coronary artery   . Cough   . Disorder of bone and cartilage, unspecified   . Edema 05/03/2013  . Exertional shortness of breath    "sometimes" (08/07/2013)  . First degree atrioventricular block   . Gout, unspecified 04/19/2013  . Hyperlipidemia 04/27/2007  . Hypertension   . Muscle spasm   . Muscle weakness (generalized)   . Myocardial infarction (HCC) ~ 1970  . Onychia and paronychia of toe   . Osteoarthrosis, unspecified whether generalized or localized, unspecified site 04/27/2007  . Other fall   . Other malaise and fatigue   . Pneumonia 05/2018  . Prepatellar bursitis   . Pulmonary embolism (HCC) 07/2018  . Seizures (HCC)   . Stroke (HCC) 01/17/2014  . TIA (transient ischemic attack)    "a few one summer" (08/07/2013)  . Type II diabetes mellitus (HCC)    "fasting 90-110s" (08/07/2013)  . Unspecified essential hypertension 08/08/2013  . Unspecified vitamin D deficiency     Past Surgical History:  Procedure Laterality Date  . ABDOMINAL HYSTERECTOMY  04/1980  . APPENDECTOMY  1960  . CARDIAC CATHETERIZATION  2003  . CATARACT EXTRACTION W/ INTRAOCULAR LENS  IMPLANT, BILATERAL Bilateral ~ 2012  . CHOLECYSTECTOMY N/A 06/26/2018   Procedure: LAPAROSCOPIC CHOLECYSTECTOMY POSSIBLE INTRAOPERATIVE CHOLANGIOGRAM;  Surgeon: Abigail Miyamoto, MD;  Location: [facility] OR;  Service: General;  Laterality: N/A;  .  JOINT REPLACEMENT    . REPLACEMENT TOTAL KNEE Right 09/2005  . RIGHT/LEFT HEART CATH AND CORONARY ANGIOGRAPHY N/A 06/01/2018   Procedure: RIGHT/LEFT HEART CATH AND CORONARY ANGIOGRAPHY;  Surgeon: Corky Crafts, MD;  Location: [facility] INVASIVE CV LAB;  Service: Cardiovascular;  Laterality: N/A;  . SHOULDER OPEN ROTATOR CUFF REPAIR Right 07/1999  . TONSILLECTOMY   08/1974  . TOTAL KNEE ARTHROPLASTY Left 08/07/2013   Procedure: TOTAL KNEE ARTHROPLASTY- LEFT;  Surgeon: Dannielle Huh, MD;  Location: [facility] OR;  Service: Orthopedics;  Laterality: Left;  . TRANSTHORACIC ECHOCARDIOGRAM  2003   EF 55-65%; mild concentric LVH     reports that she has never smoked. She has never used smokeless tobacco. She reports that she does not drink alcohol or use drugs.  Allergies  Allergen Reactions  . Clonidine Derivatives Swelling    [name]  . Fish Allergy Diarrhea, Swelling and Other (See Comments)    Turns skin "black," but can tolerate white fish Salmon- Diarrhea  . Shellfish Allergy Hives  . Doxycycline Rash  . Indomethacin Other (See Comments)    Reaction not recalled by the patient  . Lyrica [Pregabalin] Other (See Comments)    Hallucinations   . Methyldopa Other (See Comments)    Aldomet (for hypertension): Reaction not recalled by the patient  . Morphine And Related Other (See Comments)    Family reports it drops her bp that she needs iv fluids  . Orange Fruit [Citrus] Other (See Comments)    Indigestion/heartburn  . Strawberry (Diagnostic) Itching  . Cetirizine Hcl Itching and Rash  . Codeine Itching  . Levaquin [Levofloxacin In D5w] Rash  . Tomato Rash    Family History  Problem Relation Age of Onset  . Heart attack Father     Prior to Admission medications   Medication Sig Start Date End Date Taking? Authorizing Provider  albuterol (PROVENTIL HFA;VENTOLIN HFA) 108 (90 Base) MCG/ACT inhaler Inhale 1-2 puffs into the lungs every 6 (six) hours as needed for wheezing or shortness of breath.     [provider]  allopurinol (ZYLOPRIM) 100 MG tablet Take 100 mg by mouth at bedtime.     [provider]  calcium-vitamin D (OSCAL WITH D) 500-200 MG-UNIT tablet Take 1 tablet by mouth daily with breakfast.    [provider]  carvedilol (COREG) 6.25 MG  tablet Take 1 tablet (6.25 mg total) by mouth 2 (two) times daily with a meal. 07/27/18   Hongalgi, Maximino Greenland, MD  D3-1000 1000 units capsule Take 1,000 Units by mouth daily. 06/07/18   [provider]  diclofenac sodium (VOLTAREN) 1 % GEL Apply 1 application topically daily. Patient taking differently: Apply 1 application topically See admin instructions. Apply to painful areas of shoulders and arms daily as directed 02/22/18   Helane Rima, DO  ENSURE (ENSURE) Take 237 mLs by mouth.    [provider]  ferrous sulfate 325 (65 FE) MG tablet Take 1 tablet (325 mg total) by mouth 2 (two) times daily with a meal. 06/04/18   Barnetta Chapel, MD  furosemide (LASIX) 40 MG tablet Take 1 tablet (40 mg total) by mouth daily. 06/28/18   Rhetta Mura, MD  gabapentin (NEURONTIN) 100 MG capsule Take 1 capsule (100 mg total) by mouth at bedtime. 05/19/18   Helane Rima, DO  hydrALAZINE (APRESOLINE) 100 MG tablet Take 1 tablet by mouth 3 (three) times daily. 08/05/18   [provider]  hydroxychloroquine (PLAQUENIL) 200 MG tablet Take 200 mg by mouth 2 (two) times daily.    [provider]  hydroxypropyl methylcellulose / hypromellose (ISOPTO TEARS / GONIOVISC) 2.5 % ophthalmic solution Place 1 drop into both eyes daily. 07/27/18   Hongalgi, Maximino Greenland, MD  isosorbide mononitrate (IMDUR) 30 MG 24 hr tablet Take 1 tablet (30 mg total) by mouth at bedtime. 07/27/18   Hongalgi, Maximino Greenland, MD  leflunomide (ARAVA) 20 MG tablet Take 20 mg by mouth daily.    [provider]  metFORMIN (GLUCOPHAGE) 500 MG tablet Take 1 tablet (500 mg total) by mouth daily with breakfast. 01/28/18   Helane Rima, DO  Olopatadine HCl 0.2 % SOLN Place 1 drop into both eyes daily. 07/27/18   Hongalgi, Maximino Greenland, MD  potassium chloride SA (K-DUR,KLOR-CON) 20 MEQ tablet Take 1 tablet (20 mEq total) by mouth 2 (two) times daily. 07/27/18   Hongalgi, Maximino Greenland, MD  predniSONE (DELTASONE) 10 MG tablet Take 1  tablet (10 mg total) by mouth daily with breakfast. 03/25/18   Helane Rima, DO  rivaroxaban (XARELTO) 20 MG TABS tablet Take 20 mg by mouth daily with supper.    [provider]  sacubitril-valsartan (ENTRESTO) 49-51 MG Take 1 tablet by mouth daily at 12 noon.     [provider]  spironolactone (ALDACTONE) 25 MG tablet Take 12.5 mg by mouth daily.    [provider]  traMADol (ULTRAM) 50 MG tablet Take 1 tablet by mouth every 6 (six) hours as needed. 08/25/18   [provider]  triamcinolone cream (KENALOG) 0.1 % Apply 1 application topically 2 (two) times daily as needed (for itching- to affected sites).     [provider]  vitamin C (ASCORBIC ACID) 500 MG tablet Take 500 mg by mouth daily.    [provider]    Physical Exam: Vitals:   09/05/18 1840 09/05/18 1900 09/05/18 1947 09/05/18 1957  BP: (!) 144/79 128/77  (!) 179/88  Pulse: 87 94  66  Resp: 18 12  16   Temp:    97.9 F (36.6 C)  TempSrc:    Oral  SpO2: 99% 98%  98%  Weight:   76.4 kg   Height:   5\' 4"  (1.626 m)     Physical Exam  Constitutional: She is oriented to person, place, and time. She appears well-developed and well-nourished. No distress.  Resting comfortably in a hospital stretcher.  HENT:  Head: Normocephalic and atraumatic.  Mouth/Throat: Oropharynx is clear and moist.  Eyes: Pupils are equal, round, and reactive to light. Right eye exhibits no discharge. Left eye exhibits no discharge.  Neck: Neck supple. No tracheal deviation present.  Cardiovascular: Normal rate, regular rhythm and intact distal pulses.  Pulmonary/Chest: Effort normal and breath sounds normal. No respiratory distress. She has no wheezes. She has no rales.  Abdominal: Soft. Bowel sounds are normal. She exhibits no distension. There is no tenderness.  Musculoskeletal: She exhibits no edema.  Neurological: She is alert and oriented to person, place, and time. No cranial nerve deficit.   Strength 4 out of 5 throughout.  Patient attributes her slightly decreased strength to chronic pain from rheumatoid arthritis.  Skin: Skin is warm and dry. She is not diaphoretic.  Psychiatric: Her behavior is normal.     Labs on Admission: I have personally reviewed following labs and imaging studies  CBC: Recent Labs  Lab 09/05/18 1538  WBC 8.6  NEUTROABS 5.0  HGB 11.2*  HCT 36.0  MCV 95.2  PLT 276   Basic Metabolic Panel: Recent Labs  Lab 09/05/18 1538 09/05/18 2018  NA 142  --   K 3.0*  --   CL 107  --   CO2 26  --   GLUCOSE 86  --   BUN 32*  --   CREATININE 1.00  --   CALCIUM 8.8*  --   MG  --  1.8   GFR: Estimated Creatinine Clearance: 40.4 mL/min (by C-G formula based on SCr of 1 mg/dL). Liver Function Tests: No results for input(s): AST, ALT, ALKPHOS, BILITOT, PROT, ALBUMIN in the last 168 hours. No results for input(s): LIPASE, AMYLASE in the last 168 hours. No results for input(s): AMMONIA in the last 168 hours. Coagulation Profile: No results for input(s): INR, PROTIME in the last 168 hours. Cardiac Enzymes: Recent Labs  Lab 09/05/18 1538 09/05/18 2018  TROPONINI 0.04* 0.04*   BNP (last 3 results) No results for input(s): PROBNP in the last 8760 hours. HbA1C: No results for input(s): HGBA1C in the last 72 hours. CBG: Recent Labs  Lab 09/05/18 2028  GLUCAP 89   Lipid Profile: No results for input(s): CHOL, HDL, LDLCALC, TRIG, CHOLHDL, LDLDIRECT in the last 72 hours. Thyroid Function Tests: No results for input(s): TSH, T4TOTAL, FREET4, T3FREE, THYROIDAB in the last 72 hours. Anemia Panel: No results for input(s): VITAMINB12, FOLATE, FERRITIN, TIBC, IRON, RETICCTPCT in the last 72 hours. Urine analysis:    Component Value Date/Time   COLORURINE YELLOW 06/22/2018 2156   APPEARANCEUR CLEAR 06/22/2018 2156   LABSPEC 1.006 06/22/2018 2156   PHURINE 6.0 06/22/2018 2156   GLUCOSEU NEGATIVE 06/22/2018 2156   HGBUR NEGATIVE 06/22/2018 2156    BILIRUBINUR NEGATIVE 06/22/2018 2156   BILIRUBINUR Negative 01/13/2017 1234   KETONESUR NEGATIVE 06/22/2018 2156   PROTEINUR NEGATIVE 06/22/2018 2156   UROBILINOGEN 0.2 01/13/2017 1234   UROBILINOGEN 1.0 09/11/2015 0135   NITRITE NEGATIVE 06/22/2018 2156   LEUKOCYTESUR NEGATIVE 06/22/2018 2156    Radiological Exams on Admission: Ct Angio Chest Pe W And/or Wo Contrast  Result Date: 09/05/2018 CLINICAL DATA:  Weakness with bradycardia.  Syncopal episode. EXAM: CT ANGIOGRAPHY CHEST WITH CONTRAST TECHNIQUE: Multidetector CT imaging of the chest was performed using the standard protocol during bolus administration of intravenous contrast. Multiplanar CT image reconstructions and MIPs were obtained to evaluate the vascular anatomy. CONTRAST:  ISOVUE-370 IOPAMIDOL (ISOVUE-370) INJECTION 76% COMPARISON:  07/24/2018 FINDINGS: Cardiovascular: Clearing of previously noted right lower lobe pulmonary emboli. No new pulmonary embolus is identified. Stable cardiomegaly without significant pericardial effusion or thickening. Left main and three-vessel coronary arteriosclerosis is noted. Nonaneurysmal minimally atherosclerotic aorta. Common origin of the brachiocephalic and left common carotid arteries. Mediastinum/Nodes: Small subcentimeter cystic nodules of the thyroid gland. No adenopathy. Patent trachea and mainstem bronchi. Esophagus is unremarkable. Lungs/Pleura: Minimal atelectasis and/or scarring at each lung base medially and along the dependent aspect. No pulmonary consolidation, overt pulmonary edema or pneumothorax. No dominant mass. Subpleural 5 mm right middle lobe pulmonary nodule is stable, series 10/74. Upper Abdomen: Small hiatal hernia. Water attenuating cyst of the right kidney partially included measuring up to 5.5 cm. Cholecystectomy clips are noted. Musculoskeletal: Remodeled appearance about the glenohumeral joints consistent with advanced osteoarthritic change. Degenerative changes are  stable along the dorsal spine. Review of the MIP images confirms the above findings. IMPRESSION: 1. Stable cardiomegaly with coronary arteriosclerosis and aortic atherosclerosis. 2. Clearing of previously noted right lower lobe pulmonary emboli. No new pulmonary embolus identified. 3. Small subcentimeter  cystic thyroid nodules. 4. Stable 5 mm right middle lobe subpleural nodule. 5. Partially included 5.5 cm cyst of the right kidney without complicating features noted of that which was included. Aortic Atherosclerosis (ICD10-I70.0). Electronically Signed   By: Tollie Eth [name]   On: 09/05/2018 18:05    EKG: Independently reviewed.  Sinus rhythm (heart rate 88), PVCs.  Assessment/Plan Principal Problem:   Syncope Active Problems:   Reactive airway disease   Gout   Hypertension associated with diabetes (HCC)   CAD (coronary artery disease)   Rheumatoid arthritis involving multiple sites [facility])   CKD stage 3 due to type 2 diabetes mellitus (HCC)   DM (diabetes mellitus), type 2 with renal complications (HCC)   Hypokalemia   Hypertension   Pulmonary embolism (HCC), 07/2018, on Xarelto with goal to DC after 6 months if more active   Physical deconditioning   Chronic anemia   Chronic pain   Hyperlipidemia   Chronic congestive heart failure (HCC)   Syncope Likely vasovagal as it happened when patient was trying to have a bowel movement. Troponin 0.04 x 2.  EKG not suggestive of ACS. CTA showing clearing of previously noted right lower lobe pulmonary emboli; no new pulmonary emboli identified.  Echo done August 30, 2018 showing normal systolic function (EF 60 to 65%), grade 1 diastolic dysfunction, and mild mitral stenosis.  Carotid Dopplers done on June 24, 2018 showing bilateral 1 to 39% stenosis of ICA.  Afebrile and no leukocytosis to suggest underlying infection.  -Monitor on telemetry -Continue to trend troponin -Check orthostatics -Hold home diuretics (spironolactone and  Lasix)  Pulmonary embolism Patient diagnosed with a PE in September 2019 soon after having a pneumonia.  She is currently on Xarelto with plan to continue it for 6 months. CTA done today showing clearing of previously noted right lower lobe pulmonary emboli; no new pulmonary emboli identified.  -Continue home Xarelto  Hypokalemia Potassium 3.0 in the setting of home Lasix use.  Mag normal. -Replete orally -Continue to monitor; BMP in a.m.  Coronary artery disease status post MI History of prior MI in the 1970s.  Non-obstructive coronary artery disease on cath done in July 2019. -Continue home carvedilol -Per cardiology documentation, patient is not interested in a statin.  Chronic combined systolic and diastolic congestive heart failure Stable.  Does not appear volume overloaded. -Followed by outpatient cardiology -Continue home carvedilol, isosorbide mononitrate, Entresto -Hold home Lasix and spironolactone at this time  Hypertension Blood pressure elevated. -Continue home carvedilol, hydralazine, isosorbide mononitrate, Entresto  Hyperlipidemia -Per cardiology documentation, patient is not interested in a statin.  Rheumatoid arthritis -Followed by outpatient rheumatology -Continue home Arava, Plaquenil, prednisone  Gout Stable. -Continue home allopurinol  Chronic pain Stable. -Continue home Voltaren gel, gabapentin, tramadol prn  Chronic anemia Hemoglobin 11.2, at baseline. -Continue home iron supplement  Well-controlled type 2 diabetes A1c 5.9 on June 25, 2018. -Sliding scale insulin sensitive -CBG checks -Hold home metformin  CKD 3 -Stable.  Creatinine 1.0, at baseline.  Reactive airway disease Stable.  No bronchospasm. -Continue home albuterol nebulizer prn  Physical deconditioning -PT eval  DVT prophylaxis: Xarelto Code Status: Patient wishes to be full code. Family Communication: Family at bedside updated. Disposition Plan: Anticipate discharge  to home in 1 to 2 days. Consults called: None Admission status: Observation   John Giovanni MD [facility] Pager [phone]  If 7PM-7AM, please contact night-coverage [email or web address] Password [email or web address]  09/06/2018, 12:03 AM

## 2018-09-05 NOTE — Telephone Encounter (Signed)
Completed and placed in fax folder

## 2018-09-05 NOTE — Progress Notes (Signed)
ANTICOAGULATION CONSULT NOTE - Initial Consult  Pharmacy Consult for Xarelto Indication: pulmonary embolus  Allergies  Allergen Reactions  . Clonidine Derivatives Swelling    [name]  . Fish Allergy Diarrhea, Swelling and Other (See Comments)    Turns skin "black," but can tolerate white fish Salmon- Diarrhea  . Shellfish Allergy Hives  . Doxycycline Rash  . Indomethacin Other (See Comments)    Reaction not recalled by the patient  . Lyrica [Pregabalin] Other (See Comments)    Hallucinations   . Methyldopa Other (See Comments)    Aldomet (for hypertension): Reaction not recalled by the patient  . Morphine And Related Other (See Comments)    Family reports it drops her bp that she needs iv fluids  . Orange Fruit [Citrus] Other (See Comments)    Indigestion/heartburn  . Strawberry (Diagnostic) Itching  . Cetirizine Hcl Itching and Rash  . Codeine Itching  . Levaquin [Levofloxacin In D5w] Rash  . Tomato Rash    Patient Measurements: Height: 5\' 4"  (162.6 cm) Weight: 168 lb (76.2 kg) IBW/kg (Calculated) : 54.7  Vital Signs: BP: 128/77 (10/28 1900) Pulse Rate: 94 (10/28 1900)  Labs: Recent Labs    09/05/18 1538  HGB 11.2*  HCT 36.0  PLT 276  CREATININE 1.00  TROPONINI 0.04*    Estimated Creatinine Clearance: 40.4 mL/min (by C-G formula based on SCr of 1 mg/dL).   Medical History: Past Medical History:  Diagnosis Date  . Allergic rhinitis due to pollen 04/27/2007  . Arthritis    "in q joint" (08/07/2013)  . CHF (congestive heart failure) (HCC)   . Coronary atherosclerosis of native coronary artery   . Cough   . Disorder of bone and cartilage, unspecified   . Edema 05/03/2013  . Exertional shortness of breath    "sometimes" (08/07/2013)  . First degree atrioventricular block   . Gout, unspecified 04/19/2013  . Hyperlipidemia 04/27/2007  . Hypertension   . Muscle spasm   . Muscle  weakness (generalized)   . Myocardial infarction (HCC) ~ 1970  . Onychia and paronychia of toe   . Osteoarthrosis, unspecified whether generalized or localized, unspecified site 04/27/2007  . Other fall   . Other malaise and fatigue   . Pneumonia 05/2018  . Prepatellar bursitis   . Pulmonary embolism (HCC) 07/2018  . Seizures (HCC)   . Stroke (HCC) 01/17/2014  . TIA (transient ischemic attack)    "a few one summer" (08/07/2013)  . Type II diabetes mellitus (HCC)    "fasting 90-110s" (08/07/2013)  . Unspecified essential hypertension 08/08/2013  . Unspecified vitamin D deficiency     Medications:  Xarelto 20mg  daily PTA  Assessment: 84 yo F with hx PE on Xarelto PTA admitted with syncope.  Pharmacy consulted to dose Xarelto.  No new PE noted on CT.  CrCl>33ml/min.   CBC reviewed- Hg low but stable, pltc WNL.  No bleeding noted.   Plan:  Xarelto 20mg  PO daily with food Monitor s/sx of bleeding  Britaney Espaillat, 89 09/05/2018,7:35 PM

## 2018-09-06 ENCOUNTER — Other Ambulatory Visit: Payer: Self-pay

## 2018-09-06 DIAGNOSIS — R5381 Other malaise: Secondary | ICD-10-CM

## 2018-09-06 DIAGNOSIS — D649 Anemia, unspecified: Secondary | ICD-10-CM

## 2018-09-06 DIAGNOSIS — E1169 Type 2 diabetes mellitus with other specified complication: Secondary | ICD-10-CM

## 2018-09-06 DIAGNOSIS — G8929 Other chronic pain: Secondary | ICD-10-CM

## 2018-09-06 DIAGNOSIS — E860 Dehydration: Secondary | ICD-10-CM | POA: Diagnosis not present

## 2018-09-06 DIAGNOSIS — R55 Syncope and collapse: Secondary | ICD-10-CM

## 2018-09-06 DIAGNOSIS — I509 Heart failure, unspecified: Secondary | ICD-10-CM

## 2018-09-06 DIAGNOSIS — E785 Hyperlipidemia, unspecified: Secondary | ICD-10-CM

## 2018-09-06 LAB — GLUCOSE, CAPILLARY
Glucose-Capillary: 115 mg/dL — ABNORMAL HIGH (ref 70–99)
Glucose-Capillary: 126 mg/dL — ABNORMAL HIGH (ref 70–99)
Glucose-Capillary: 149 mg/dL — ABNORMAL HIGH (ref 70–99)
Glucose-Capillary: 168 mg/dL — ABNORMAL HIGH (ref 70–99)
Glucose-Capillary: 224 mg/dL — ABNORMAL HIGH (ref 70–99)

## 2018-09-06 LAB — TROPONIN I
Troponin I: 0.03 ng/mL (ref <0.03)
Troponin I: 0.03 ng/mL (ref <0.03)

## 2018-09-06 MED ORDER — SODIUM CHLORIDE 0.9 % IV BOLUS
500.0000 mL | Freq: Once | INTRAVENOUS | Status: AC
  Administered 2018-09-06: 500 mL via INTRAVENOUS

## 2018-09-06 MED ORDER — SODIUM CHLORIDE 0.9 % IV SOLN
INTRAVENOUS | Status: AC
  Administered 2018-09-06 – 2018-09-07 (×2): via INTRAVENOUS

## 2018-09-06 NOTE — Evaluation (Signed)
Physical Therapy Evaluation Patient Details Name: [name] [name] MRN: [id number] DOB: 1930-01-01 Today's Date: 09/06/2018   History of Present Illness  84 yo female admitted with syncope-likely vasovagal. Hx of CHF, CAD, MI, PE, Sz, CVA, DM, RA, gout.   Clinical Impression  On eval, pt was Supervision level assist for mobility. She walked ~75 feet x 2 with a RW. No LOB with RW use. Pt denied lightheadedness. Discussed d/c plan with pt and daughter-pt plans to return home with HHPT f/u. She will resume aide assistance as well. Will follow and progress activity as tolerated.     Follow Up Recommendations Home health PT;Supervision - Intermittent    Equipment Recommendations  None recommended by PT    Recommendations for Other Services       Precautions / Restrictions Precautions Precautions: Fall Restrictions Weight Bearing Restrictions: No      Mobility  Bed Mobility Overal bed mobility: Needs Assistance Bed Mobility: Sit to Supine       Sit to supine: Supervision   General bed mobility comments: for safety  Transfers Overall transfer level: Needs assistance Equipment used: Rolling walker (2 wheeled) Transfers: Sit to/from Stand Sit to Stand: Supervision         General transfer comment: for safety  Ambulation/Gait Ambulation/Gait assistance: Supervision Gait Distance (Feet): 75 Feet(x2) Assistive device: Rolling walker (2 wheeled) Gait Pattern/deviations: Step-through pattern;Decreased stride length     General Gait Details: slow, steady gait speed. No LOB with RW use. Seated rest break taken due to fatigue. Pt denied lightheadedness.   Stairs            Wheelchair Mobility    Modified Rankin (Stroke Patients Only)       Balance Overall balance assessment: Mild deficits observed, not formally tested                                           Pertinent Vitals/Pain Pain Assessment: Faces Faces Pain Scale: Hurts even  more Pain Location: R shoulder/wrist Pain Descriptors / Indicators: Sore;Aching Pain Intervention(s): Monitored during session;Repositioned    Home Living Family/patient expects to be discharged to:: Private residence Living Arrangements: Alone Available Help at Discharge: Personal care attendant;Family;Available PRN/intermittently Type of Home: Apartment Home Access: Level entry     Home Layout: One level Home Equipment: Hand held shower head;Walker - 4 wheels;Walker - 2 wheels;Cane - single point;Tub bench;Wheelchair - manual;Grab bars - toilet Additional Comments: Aide 7 days/week 9-12 and occasionally has family support overnight    Prior Function Level of Independence: Needs assistance   Gait / Transfers Assistance Needed: Uses rollator for functional mobility.   ADL's / Homemaking Assistance Needed: Aide assists with ADL, meals, and bed mobility.         Hand Dominance        Extremity/Trunk Assessment   Upper Extremity Assessment Upper Extremity Assessment: Generalized weakness    Lower Extremity Assessment Lower Extremity Assessment: Generalized weakness    Cervical / Trunk Assessment Cervical / Trunk Assessment: Kyphotic  Communication   Communication: No difficulties  Cognition Arousal/Alertness: Awake/alert Behavior During Therapy: WFL for tasks assessed/performed Overall Cognitive Status: Within Functional Limits for tasks assessed  General Comments      Exercises     Assessment/Plan    PT Assessment Patient needs continued PT services  PT Problem List Decreased mobility;Decreased strength;Decreased activity tolerance       PT Treatment Interventions DME instruction;Gait training;Functional mobility training;Therapeutic activities;Balance training;Patient/family education;Therapeutic exercise    PT Goals (Current goals can be found in the Care Plan section)  Acute Rehab PT  Goals Patient Stated Goal: home PT Goal Formulation: With patient Time For Goal Achievement: 09/20/18 Potential to Achieve Goals: Good    Frequency Min 3X/week   Barriers to discharge        Co-evaluation               AM-PAC PT "6 Clicks" Daily Activity  Outcome Measure Difficulty turning over in bed (including adjusting bedclothes, sheets and blankets)?: A Little Difficulty moving from lying on back to sitting on the side of the bed? : A Little Difficulty sitting down on and standing up from a chair with arms (e.g., wheelchair, bedside commode, etc,.)?: A Little Help needed moving to and from a bed to chair (including a wheelchair)?: A Little Help needed walking in hospital room?: A Little Help needed climbing 3-5 steps with a railing? : A Lot 6 Click Score: 17    End of Session Equipment Utilized During Treatment: Gait belt Activity Tolerance: Patient tolerated treatment well Patient left: in bed;with call bell/phone within reach;with bed alarm set   PT Visit Diagnosis: Muscle weakness (generalized) (M62.81)    Time: 1638-4665 PT Time Calculation (min) (ACUTE ONLY): 30 min   Charges:   PT Evaluation $PT Eval Moderate Complexity: 1 Mod PT Treatments $Gait Training: 8-22 mins          Rebeca Alert, PT Acute Rehabilitation Services Pager: [phone] Office: [phone]

## 2018-09-06 NOTE — Care Management Obs Status (Signed)
MEDICARE OBSERVATION STATUS NOTIFICATION   Patient Details  Name: [name] [name] MRN: [id number] Date of Birth: 1930-03-26   Medicare Observation Status Notification Given:  Yes    Geni Bers, RN 09/06/2018, 12:55 PM

## 2018-09-06 NOTE — Care Management Note (Addendum)
Case Management Note  Patient Details  Name: NAASIA WEILBACHER MRN: [id number] Date of Birth: Apr 24, 1930  Subjective/Objective:     Pt admitted with Syncope               Action/Plan: Plan to discharge home with [facility] HHRN/PT   Expected Discharge Date:  (unknown)               Expected Discharge Plan:     In-House Referral:   CM  Discharge planning Services   Home Health  Post Acute Care Choice:    Choice offered to:   Adult Child  DME Arranged:    DME Agency:     HH Arranged:   RN,PT HH Agency:   [facility]  Status of Service:   Complete  If discussed at [employer], dates discussed:    Additional CommentsGeni Bers, RN 09/06/2018, 12:34 PM

## 2018-09-06 NOTE — Progress Notes (Signed)
PROGRESS NOTE    [name] MADIA  GNF:[id number] DOB: August 01, 1930 DOA: 09/05/2018 PCP: Helane Rima, DO   Brief Narrative: 84 year old with past medical history relevant for rheumatoid arthritis, hypertension, hyperlipidemia, coronary artery disease (catheterization on 06/01/2018 with nonobstructive disease), cardiomyopathy with most recent echo on 08/30/2018 Showing resolution with grade 1 diastolic dysfunction, gout, type 2 diabetes on oral hypoglycemics, stage III CKD, pulmonary embolism on rivaroxaban, CVA, iron deficiency anemia/hypoproliferative anemia who presented with syncopal episode while straining on the toilet concerning for vasovagal syncope.   Assessment & Plan:   Principal Problem:   Syncope Active Problems:   Reactive airway disease   Gout   Hypertension associated with diabetes (HCC)   CAD (coronary artery disease)   Rheumatoid arthritis involving multiple sites [facility])   CKD stage 3 due to type 2 diabetes mellitus (HCC)   DM (diabetes mellitus), type 2 with renal complications (HCC)   Hypokalemia   Hypertension   Pulmonary embolism (HCC), 07/2018, on Xarelto with goal to DC after 6 months if more active   Physical deconditioning   Chronic anemia   Chronic pain   Hyperlipidemia   Chronic congestive heart failure (HCC)   #) Vasovagal syncope: Her history is most consistent with this at this time.  She has certainly many reasons to have dysrhythmias as well as worsening of her pulmonary embolism though CT a on admission showed that her PEs were stable.  She is recently had an echo suggesting that her ejection fraction is not dramatically changed.  She has no signs or symptoms of heart failure.  Certainly she is on multiple medications that could cause her dehydration she appears to be slightly volume depleted including diuretics as well as multiple blood pressure medications. -IV bolus -Physical therapy consult -Telemetry  #) Nonobstructive coronary artery  disease/hypertension/hyperlipidemia: - Continue carvedilol 6.25 mg twice daily - Continue isosorbide mononitrate 30 mg nightly - Continue aspirin lactone 12.5 mg daily -Continue sacubitril/valsartan daily-continue hydralazine 20 mg 3 times daily #) Stage III CKD: Stable -Hold nephrotoxins  #)  chronic diastolic heart failure grade 1: -Hold furosemide -IV fluids  #) Hypoproliferative anemia/stage III CKD: -Hold nephrotoxins -Continue iron supplementation twice daily  #) History of pulmonary embolism: This appears to be stable on recent CT imaging -Continue rivaroxaban  #) Rheumatoid arthritis: - Continue leflunomide 20 mg daily -Continue hydroxychloroquine 200 mg twice daily -Continue prednisone 10 mg daily  #) Type 2 diabetes: -Sliding scale insulin, AC at bedtime -Hold metformin 500 mg twice daily  #) Pain/psych: -Continue gabapentin 100 mg nightly  Fluids: Gentle IV fluids Elect lites: Monitor and supplement Nutrition: Heart healthy/carb restricted diet  Prophylaxis: On rivaroxaban  Disposition: Pending PT evaluation and IV fluids  Full code     Consultants:   None  Procedures:   None  Antimicrobials:   None   Subjective: This morning patient reports she is doing well.  She denies any chest pain, palpitations, cough, congestion, abdominal pain, nausea, vomiting, diarrhea.  She reports that she feels quite fatigued.  And tired.  Objective: Vitals:   09/05/18 1957 09/06/18 0107 09/06/18 0440 09/06/18 1009  BP: (!) 179/88 139/90 132/75 103/67  Pulse: 66 93 (!) 54 99  Resp: 16 16 14    Temp: 97.9 F (36.6 C) 98 F (36.7 C) 98 F (36.7 C)   TempSrc: Oral Oral Oral   SpO2: 98% 96% 96%   Weight:      Height:        Intake/Output Summary (Last 24  hours) at 09/06/2018 1045 Last data filed at 09/06/2018 0600 Gross per 24 hour  Intake 30 ml  Output 800 ml  Net -770 ml   Filed Weights   09/05/18 1412 09/05/18 1947  Weight: 76.2 kg 76.4 kg     Examination:  General exam: Appears calm and comfortable  Respiratory system: Clear to auscultation. Respiratory effort normal. Cardiovascular system: Regular rate and rhythm, no murmurs Gastrointestinal system: Abdomen is nondistended, soft and nontender. No organomegaly or masses felt. Normal bowel sounds heard. Central nervous system: Alert and oriented. No focal neurological deficits. Extremities: No lower extremity edema. Skin: No rashes over visible skin Psychiatry: Judgement and insight appear normal. Mood & affect appropriate.     Data Reviewed: I have personally reviewed following labs and imaging studies  CBC: Recent Labs  Lab 09/05/18 1538  WBC 8.6  NEUTROABS 5.0  HGB 11.2*  HCT 36.0  MCV 95.2  PLT 276   Basic Metabolic Panel: Recent Labs  Lab 09/05/18 1538 09/05/18 2018  NA 142  --   K 3.0*  --   CL 107  --   CO2 26  --   GLUCOSE 86  --   BUN 32*  --   CREATININE 1.00  --   CALCIUM 8.8*  --   MG  --  1.8   GFR: Estimated Creatinine Clearance: 40.4 mL/min (by C-G formula based on SCr of 1 mg/dL). Liver Function Tests: No results for input(s): AST, ALT, ALKPHOS, BILITOT, PROT, ALBUMIN in the last 168 hours. No results for input(s): LIPASE, AMYLASE in the last 168 hours. No results for input(s): AMMONIA in the last 168 hours. Coagulation Profile: No results for input(s): INR, PROTIME in the last 168 hours. Cardiac Enzymes: Recent Labs  Lab 09/05/18 1538 09/05/18 2018 09/06/18 0131 09/06/18 0716  TROPONINI 0.04* 0.04* 0.03* 0.03*   BNP (last 3 results) No results for input(s): PROBNP in the last 8760 hours. HbA1C: No results for input(s): HGBA1C in the last 72 hours. CBG: Recent Labs  Lab 09/05/18 2028 09/06/18 0433 09/06/18 0739  GLUCAP 89 115* 126*   Lipid Profile: No results for input(s): CHOL, HDL, LDLCALC, TRIG, CHOLHDL, LDLDIRECT in the last 72 hours. Thyroid Function Tests: No results for input(s): TSH, T4TOTAL, FREET4,  T3FREE, THYROIDAB in the last 72 hours. Anemia Panel: No results for input(s): VITAMINB12, FOLATE, FERRITIN, TIBC, IRON, RETICCTPCT in the last 72 hours. Sepsis Labs: No results for input(s): PROCALCITON, LATICACIDVEN in the last 168 hours.  No results found for this or any previous visit (from the past 240 hour(s)).       Radiology Studies: Ct Angio Chest Pe W And/or Wo Contrast  Result Date: 09/05/2018 CLINICAL DATA:  Weakness with bradycardia.  Syncopal episode. EXAM: CT ANGIOGRAPHY CHEST WITH CONTRAST TECHNIQUE: Multidetector CT imaging of the chest was performed using the standard protocol during bolus administration of intravenous contrast. Multiplanar CT image reconstructions and MIPs were obtained to evaluate the vascular anatomy. CONTRAST:  ISOVUE-370 IOPAMIDOL (ISOVUE-370) INJECTION 76% COMPARISON:  07/24/2018 FINDINGS: Cardiovascular: Clearing of previously noted right lower lobe pulmonary emboli. No new pulmonary embolus is identified. Stable cardiomegaly without significant pericardial effusion or thickening. Left main and three-vessel coronary arteriosclerosis is noted. Nonaneurysmal minimally atherosclerotic aorta. Common origin of the brachiocephalic and left common carotid arteries. Mediastinum/Nodes: Small subcentimeter cystic nodules of the thyroid gland. No adenopathy. Patent trachea and mainstem bronchi. Esophagus is unremarkable. Lungs/Pleura: Minimal atelectasis and/or scarring at each lung base medially and along the dependent aspect.  No pulmonary consolidation, overt pulmonary edema or pneumothorax. No dominant mass. Subpleural 5 mm right middle lobe pulmonary nodule is stable, series 10/74. Upper Abdomen: Small hiatal hernia. Water attenuating cyst of the right kidney partially included measuring up to 5.5 cm. Cholecystectomy clips are noted. Musculoskeletal: Remodeled appearance about the glenohumeral joints consistent with advanced osteoarthritic change. Degenerative  changes are stable along the dorsal spine. Review of the MIP images confirms the above findings. IMPRESSION: 1. Stable cardiomegaly with coronary arteriosclerosis and aortic atherosclerosis. 2. Clearing of previously noted right lower lobe pulmonary emboli. No new pulmonary embolus identified. 3. Small subcentimeter cystic thyroid nodules. 4. Stable 5 mm right middle lobe subpleural nodule. 5. Partially included 5.5 cm cyst of the right kidney without complicating features noted of that which was included. Aortic Atherosclerosis (ICD10-I70.0). Electronically Signed   By: Tollie Eth [name]   On: 09/05/2018 18:05        Scheduled Meds: . allopurinol  100 mg Oral QHS  . carvedilol  6.25 mg Oral BID WC  . cholecalciferol  1,000 Units Oral Daily  . diclofenac sodium  1 application Topical Daily  . feeding supplement (ENSURE ENLIVE)  237 mL Oral BID BM  . ferrous sulfate  325 mg Oral BID WC  . gabapentin  100 mg Oral QHS  . hydrALAZINE  100 mg Oral Q8H  . hydroxychloroquine  200 mg Oral BID  . Influenza vac split quadrivalent PF  0.5 mL Intramuscular Tomorrow-1000  . insulin aspart  0-9 Units Subcutaneous TID WC  . isosorbide mononitrate  30 mg Oral QHS  . leflunomide  20 mg Oral Daily  . olopatadine  1 drop Both Eyes BID  . polyvinyl alcohol  1 drop Both Eyes Daily  . predniSONE  10 mg Oral Q breakfast  . rivaroxaban  20 mg Oral Q supper  . sacubitril-valsartan  1 tablet Oral Q1200  . sodium chloride flush  3 mL Intravenous Q12H  . vitamin C  500 mg Oral Daily   Continuous Infusions:   LOS: 0 days    Time spent: 35    Delaine Lame, MD [facility]  If 7PM-7AM, please contact night-coverage [email or web address] Password [email or web address] 09/06/2018, 10:45 AM

## 2018-09-07 ENCOUNTER — Ambulatory Visit: Payer: Self-pay | Admitting: *Deleted

## 2018-09-07 DIAGNOSIS — E860 Dehydration: Secondary | ICD-10-CM | POA: Diagnosis not present

## 2018-09-07 DIAGNOSIS — R55 Syncope and collapse: Secondary | ICD-10-CM | POA: Diagnosis not present

## 2018-09-07 LAB — GLUCOSE, CAPILLARY
Glucose-Capillary: 131 mg/dL — ABNORMAL HIGH (ref 70–99)
Glucose-Capillary: 162 mg/dL — ABNORMAL HIGH (ref 70–99)
Glucose-Capillary: 156 mg/dL — ABNORMAL HIGH (ref 70–99)

## 2018-09-07 NOTE — Discharge Instructions (Signed)
Near-Syncope °Near-syncope is when you suddenly get weak or dizzy, or you feel like you might pass out (faint). During an episode of near-syncope, you may: °· Feel dizzy or light-headed. °· Feel sick to your stomach (nauseous). °· See all white or all black. °· Have cold, clammy skin. ° °If you passed out, get help right away.Call your local emergency services (911 in the [place] Do not drive yourself to the hospital. °Follow these instructions at home: °Pay attention to any changes in your symptoms. Take these actions to help with your condition: °· Have someone stay with you until you feel stable. °· Do not drive, use machinery, or play sports until your doctor says it is okay. °· Keep all follow-up visits as told by your doctor. This is important. °· If you start to feel like you might pass out, lie down right away and raise (elevate) your feet above the level of your heart. Breathe deeply and steadily. Wait until all of the symptoms are gone. °· Drink enough fluid to keep your pee (urine) clear or pale yellow. °· If you are taking blood pressure or heart medicine, get up slowly and spend many minutes getting ready to sit and then stand. This can help with dizziness. °· Take over-the-counter and prescription medicines only as told by your doctor. ° °Get help right away if: °· You have a very bad headache. °· You have unusual pain in your chest, tummy, or back. °· You are bleeding from your mouth or rectum. °· You have black or tarry poop (stool). °· You have a very fast or uneven heartbeat (palpitations). °· You pass out one time or more than once. °· You have jerky movements that you cannot control (seizure). °· You are confused. °· You have trouble walking. °· You are very weak. °· You have vision problems. °These symptoms may be an emergency. Do not wait to see if the symptoms will go away. Get medical help right away. Call your local emergency services (911 in the [place] Do not drive yourself to the  hospital. °This information is not intended to replace advice given to you by your health care provider. Make sure you discuss any questions you have with your health care provider. °Document Released: 04/13/2008 Document Revised: 04/02/2016 Document Reviewed: 07/10/2015 °Elsevier Interactive Patient Education © 2017 [employer] ° °

## 2018-09-07 NOTE — Discharge Summary (Signed)
Physician Discharge Summary  [name] [name] VOH:[id number] DOB: [birth year] DOA: 09/05/2018  PCP: Helane Rima, DO  Admit date: 09/05/2018 Discharge date: 09/07/2018  Admitted From: Home Disposition:  Home  Recommendations for Outpatient Follow-up:  1. Follow up with PCP in 1-2 weeks 2. Please obtain BMP/CBC in one week   Home Health: No Equipment/Devices:No  Discharge Condition: stable CODE STATUS:FULL Diet recommendation: Heart Healthy / Carb Modified  Brief/Interim Summary:  #) Vasovagal syncope: Patient was admitted with syncope in the setting of bearing down while going to the bathroom.  Her labs are notable for what appeared to be mild dehydration likely secondary to her significant diuretics.  She was given IV fluids.  She did have a recent echo that was unremarkable.  A CT a during admission showed that she did have known pulmonary emboli but no progression of these.  She was evaluated by physical therapy and was recommended only for home health intermittent supervision.  She was given instructions on constipation.  #).Non-obstructive coronary artery disease/hypertension/hyperlipidemia: Patient was continued on home carvedilol, isosorbide mononitrate, hydralazine, spironolactone, aspirin, sacubitril/valsartan.  #) Stage III CKD: This was stable.  #) Chronic diastolic grade 1 heart failure: Home diuretics were held while she was given gentle IV fluids as she was felt to be mildly dehydrated.  She may restart her home diuretics.  #) Hypoproliferative anemia: Patient was continued on home iron supplementation.  #) History of PE: Patient was continued on home rivaroxaban.  #) rheumatoid arthritis: Patient was continued on home hydroxychloroquine, leflunomide prednisone.  #) Type 2 diabetes: Patient was maintained on sliding scale here.  She may restart her home metformin on discharge.  #) pain/psych: Patient was continued on home gabapentin.  Discharge Diagnoses:   Principal Problem:   Syncope Active Problems:   Reactive airway disease   Gout   Hypertension associated with diabetes (HCC)   CAD (coronary artery disease)   Rheumatoid arthritis involving multiple sites [facility])   CKD stage 3 due to type 2 diabetes mellitus (HCC)   DM (diabetes mellitus), type 2 with renal complications (HCC)   Hypokalemia   Hypertension   Pulmonary embolism (HCC), 07/2018, on Xarelto with goal to DC after 6 months if more active   Physical deconditioning   Chronic anemia   Chronic pain   Hyperlipidemia   Chronic congestive heart failure [facility])    Discharge Instructions  Discharge Instructions    Diet - low sodium heart healthy   Complete by:  As directed    Discharge instructions   Complete by:  As directed    Please follow-up with your primary care doctor in 1 week.  Please have your blood work checked.   Increase activity slowly   Complete by:  As directed      Allergies as of 09/07/2018      Reactions   Clonidine Derivatives Swelling   [name]   Fish Allergy Diarrhea, Swelling, Other (See Comments)   Turns skin "black," but can tolerate white fish Salmon- Diarrhea   Shellfish Allergy Hives   Doxycycline Rash   Indomethacin Other (See Comments)   Reaction not recalled by the patient   Lyrica [pregabalin] Other (See Comments)   Hallucinations   Methyldopa Other (See Comments)   Aldomet (for hypertension): Reaction not recalled by the patient   Morphine And Related Other (See Comments)   Family reports it drops her bp that she needs iv fluids   Orange Fruit [citrus]  Other (See Comments)   Indigestion/heartburn   Strawberry (diagnostic) Itching   Cetirizine Hcl Itching, Rash   Codeine Itching   Levaquin [levofloxacin In D5w] Rash   Tomato Rash      Medication List    TAKE these medications   albuterol 108 (90 Base) MCG/ACT inhaler Commonly known as:  PROVENTIL  HFA;VENTOLIN HFA Inhale 1-2 puffs into the lungs every 6 (six) hours as needed for wheezing or shortness of breath.   allopurinol 100 MG tablet Commonly known as:  ZYLOPRIM Take 100 mg by mouth at bedtime.   carvedilol 6.25 MG tablet Commonly known as:  COREG Take 1 tablet (6.25 mg total) by mouth 2 (two) times daily with a meal.   D3-1000 1000 units capsule Generic drug:  Cholecalciferol Take 1,000 Units by mouth daily.   diclofenac sodium 1 % Gel Commonly known as:  VOLTAREN Apply 1 application topically daily. What changed:    when to take this  reasons to take this  additional instructions   ENSURE Take 237 mLs by mouth every evening.   ENTRESTO 49-51 MG Generic drug:  sacubitril-valsartan Take 1 tablet by mouth daily at 12 noon.   ferrous sulfate 325 (65 FE) MG tablet Take 1 tablet (325 mg total) by mouth 2 (two) times daily with a meal.   furosemide 40 MG tablet Commonly known as:  LASIX Take 1 tablet (40 mg total) by mouth daily.   gabapentin 100 MG capsule Commonly known as:  NEURONTIN Take 1 capsule (100 mg total) by mouth at bedtime.   hydrALAZINE 100 MG tablet Commonly known as:  APRESOLINE Take 1 tablet by mouth 3 (three) times daily.   hydroxychloroquine 200 MG tablet Commonly known as:  PLAQUENIL Take 200 mg by mouth 2 (two) times daily.   hydroxypropyl methylcellulose / hypromellose 2.5 % ophthalmic solution Commonly known as:  ISOPTO TEARS / GONIOVISC Place 1 drop into both eyes daily.   isosorbide mononitrate 30 MG 24 hr tablet Commonly known as:  IMDUR Take 1 tablet (30 mg total) by mouth at bedtime.   leflunomide 20 MG tablet Commonly known as:  ARAVA Take 20 mg by mouth daily.   metFORMIN 500 MG tablet Commonly known as:  GLUCOPHAGE Take 1 tablet (500 mg total) by mouth daily with breakfast.   Olopatadine HCl 0.2 % Soln Place 1 drop into both eyes daily.   potassium chloride SA 20 MEQ tablet Commonly known as:   K-DUR,KLOR-CON Take 1 tablet (20 mEq total) by mouth 2 (two) times daily.   predniSONE 10 MG tablet Commonly known as:  DELTASONE Take 1 tablet (10 mg total) by mouth daily with breakfast.   rivaroxaban 20 MG Tabs tablet Commonly known as:  XARELTO Take 20 mg by mouth daily with supper.   spironolactone 25 MG tablet Commonly known as:  ALDACTONE Take 12.5 mg by mouth daily.   traMADol 50 MG tablet Commonly known as:  ULTRAM Take 1 tablet by mouth every 6 (six) hours as needed for moderate pain or severe pain.   triamcinolone cream 0.1 % Commonly known as:  KENALOG Apply 1 application topically 2 (two) times daily as needed (for itching- to affected sites).   vitamin C 500 MG tablet Commonly known as:  ASCORBIC ACID Take 500 mg by mouth daily.       Allergies  Allergen Reactions  . Clonidine Derivatives Swelling    [name]  . Fish Allergy Diarrhea, Swelling  and Other (See Comments)    Turns skin "black," but can tolerate white fish Salmon- Diarrhea  . Shellfish Allergy Hives  . Doxycycline Rash  . Indomethacin Other (See Comments)    Reaction not recalled by the patient  . Lyrica [Pregabalin] Other (See Comments)    Hallucinations   . Methyldopa Other (See Comments)    Aldomet (for hypertension): Reaction not recalled by the patient  . Morphine And Related Other (See Comments)    Family reports it drops her bp that she needs iv fluids  . Orange Fruit [Citrus] Other (See Comments)    Indigestion/heartburn  . Strawberry (Diagnostic) Itching  . Cetirizine Hcl Itching and Rash  . Codeine Itching  . Levaquin [Levofloxacin In D5w] Rash  . Tomato Rash    Consultations:  None   Procedures/Studies: Ct Angio Chest Pe W And/or Wo Contrast  Result Date: 09/05/2018 CLINICAL DATA:  Weakness with bradycardia.  Syncopal episode. EXAM: CT ANGIOGRAPHY CHEST WITH CONTRAST TECHNIQUE: Multidetector CT  imaging of the chest was performed using the standard protocol during bolus administration of intravenous contrast. Multiplanar CT image reconstructions and MIPs were obtained to evaluate the vascular anatomy. CONTRAST:  ISOVUE-370 IOPAMIDOL (ISOVUE-370) INJECTION 76% COMPARISON:  07/24/2018 FINDINGS: Cardiovascular: Clearing of previously noted right lower lobe pulmonary emboli. No new pulmonary embolus is identified. Stable cardiomegaly without significant pericardial effusion or thickening. Left main and three-vessel coronary arteriosclerosis is noted. Nonaneurysmal minimally atherosclerotic aorta. Common origin of the brachiocephalic and left common carotid arteries. Mediastinum/Nodes: Small subcentimeter cystic nodules of the thyroid gland. No adenopathy. Patent trachea and mainstem bronchi. Esophagus is unremarkable. Lungs/Pleura: Minimal atelectasis and/or scarring at each lung base medially and along the dependent aspect. No pulmonary consolidation, overt pulmonary edema or pneumothorax. No dominant mass. Subpleural 5 mm right middle lobe pulmonary nodule is stable, series 10/74. Upper Abdomen: Small hiatal hernia. Water attenuating cyst of the right kidney partially included measuring up to 5.5 cm. Cholecystectomy clips are noted. Musculoskeletal: Remodeled appearance about the glenohumeral joints consistent with advanced osteoarthritic change. Degenerative changes are stable along the dorsal spine. Review of the MIP images confirms the above findings. IMPRESSION: 1. Stable cardiomegaly with coronary arteriosclerosis and aortic atherosclerosis. 2. Clearing of previously noted right lower lobe pulmonary emboli. No new pulmonary embolus identified. 3. Small subcentimeter cystic thyroid nodules. 4. Stable 5 mm right middle lobe subpleural nodule. 5. Partially included 5.5 cm cyst of the right kidney without complicating features noted of that which was included. Aortic Atherosclerosis (ICD10-I70.0).  Electronically Signed   By: Tollie Eth [name]   On: 09/05/2018 18:05     Subjective:   Discharge Exam: Vitals:   09/07/18 0431 09/07/18 0849  BP: (!) 176/98 137/73  Pulse: 91 99  Resp: 16   Temp: 98.4 F (36.9 C)   SpO2: 99%    Vitals:   09/06/18 2054 09/07/18 0051 09/07/18 0431 09/07/18 0849  BP: (!) 173/90 125/80 (!) 176/98 137/73  Pulse: (!) 47 (!) 45 91 99  Resp: 16 18 16    Temp: 98.7 F (37.1 C) 98.3 F (36.8 C) 98.4 F (36.9 C)   TempSrc: Oral Oral Oral   SpO2: 100% 98% 99%   Weight:   79.6 kg   Height:      General exam: Appears calm and comfortable  Respiratory system: Clear to auscultation. Respiratory effort normal. Cardiovascular system: Regular rate and rhythm, no murmurs Gastrointestinal system: Abdomen is nondistended, soft and nontender. No organomegaly or masses felt. Normal bowel sounds  heard. Central nervous system: Alert and oriented. No focal neurological deficits. Extremities: No lower extremity edema. Skin: No rashes over visible skin Psychiatry: Judgement and insight appear normal. Mood & affect appropriate.     The results of significant diagnostics from this hospitalization (including imaging, microbiology, ancillary and laboratory) are listed below for reference.     Microbiology: No results found for this or any previous visit (from the past 240 hour(s)).   Labs: BNP (last 3 results) Recent Labs    05/28/18 1614 06/22/18 2153 07/23/18 2100  BNP 2,077.6* 1,146.7* 138.3*   Basic Metabolic Panel: Recent Labs  Lab 09/05/18 1538 09/05/18 2018  NA 142  --   K 3.0*  --   CL 107  --   CO2 26  --   GLUCOSE 86  --   BUN 32*  --   CREATININE 1.00  --   CALCIUM 8.8*  --   MG  --  1.8   Liver Function Tests: No results for input(s): AST, ALT, ALKPHOS, BILITOT, PROT, ALBUMIN in the last 168 hours. No results for input(s): LIPASE, AMYLASE in the last 168 hours. No results for input(s): AMMONIA in the last 168 hours. CBC: Recent  Labs  Lab 09/05/18 1538  WBC 8.6  NEUTROABS 5.0  HGB 11.2*  HCT 36.0  MCV 95.2  PLT 276   Cardiac Enzymes: Recent Labs  Lab 09/05/18 1538 09/05/18 2018 09/06/18 0131 09/06/18 0716  TROPONINI 0.04* 0.04* 0.03* 0.03*   BNP: Invalid input(s): POCBNP CBG: Recent Labs  Lab 09/06/18 0739 09/06/18 1208 09/06/18 1712 09/06/18 2053 09/07/18 0750  GLUCAP 126* 149* 168* 224* 131*   D-Dimer No results for input(s): DDIMER in the last 72 hours. Hgb A1c No results for input(s): HGBA1C in the last 72 hours. Lipid Profile No results for input(s): CHOL, HDL, LDLCALC, TRIG, CHOLHDL, LDLDIRECT in the last 72 hours. Thyroid function studies No results for input(s): TSH, T4TOTAL, T3FREE, THYROIDAB in the last 72 hours.  Invalid input(s): FREET3 Anemia work up No results for input(s): VITAMINB12, FOLATE, FERRITIN, TIBC, IRON, RETICCTPCT in the last 72 hours. Urinalysis    Component Value Date/Time   COLORURINE YELLOW 06/22/2018 2156   APPEARANCEUR CLEAR 06/22/2018 2156   LABSPEC 1.006 06/22/2018 2156   PHURINE 6.0 06/22/2018 2156   GLUCOSEU NEGATIVE 06/22/2018 2156   HGBUR NEGATIVE 06/22/2018 2156   BILIRUBINUR NEGATIVE 06/22/2018 2156   BILIRUBINUR Negative 01/13/2017 1234   KETONESUR NEGATIVE 06/22/2018 2156   PROTEINUR NEGATIVE 06/22/2018 2156   UROBILINOGEN 0.2 01/13/2017 1234   UROBILINOGEN 1.0 09/11/2015 0135   NITRITE NEGATIVE 06/22/2018 2156   LEUKOCYTESUR NEGATIVE 06/22/2018 2156   Sepsis Labs Invalid input(s): PROCALCITONIN,  WBC,  LACTICIDVEN Microbiology No results found for this or any previous visit (from the past 240 hour(s)).   Time coordinating discharge: 35  SIGNED:   Delaine Lame, MD  [facility] 09/07/2018, 10:20 AM  If 7PM-7AM, please contact night-coverage [email or web address] Password [email or web address]

## 2018-09-08 ENCOUNTER — Telehealth: Payer: Self-pay | Admitting: *Deleted

## 2018-09-08 ENCOUNTER — Other Ambulatory Visit: Payer: Self-pay | Admitting: *Deleted

## 2018-09-08 ENCOUNTER — Telehealth: Payer: Self-pay

## 2018-09-08 NOTE — Telephone Encounter (Signed)
Copied from CRM [id number]. Topic: General - Other >> Sep 08, 2018  1:06 PM Jaquita Rector A wrote: Reason for CRM: Matthias Hughs nurse with [facility] called to inform Dr  Earlene Plater that she will be going out to see patient in Tuesday 09/13/18. Any questions please call Ph# [phone] ok to leave message

## 2018-09-08 NOTE — Patient Outreach (Signed)
[facility]) Care Management  09/08/2018  [name] [name] 06-15-1930 [id number]    RN Attempted outreach call today based upon pt's having a recent ED/observational stay and d/c/ 10/30 from the hospital however unsuccessful but able to leave a HIPAA approved voice message requesting a call back. Will follow up once again next week with another call and letter if remains unsuccessful.  Elliot Cousin, RN Care Management Coordinator [facility] Main Office [phone]

## 2018-09-08 NOTE — Telephone Encounter (Signed)
Per chart review: Admit date: 09/05/2018 Discharge date: 09/07/2018  Admitted From: Home Disposition:  Home  Recommendations for Outpatient Follow-up:  1. Follow up with PCP in 1-2 weeks 2. Please obtain BMP/CBC in one week   Home Health: No Equipment/Devices:No  Discharge Condition: stable CODE STATUS:FULL Diet recommendation: Heart Healthy / Carb Modified ___________________________________________________________________ Per telephone call: Transition Care Management Follow-up Telephone Call   Date discharged? 09/08/18   How have you been since you were released from the hospital? "well"   Do you understand why you were in the hospital? yes   Do you understand the discharge instructions? yes   Where were you discharged to? Home   Items Reviewed:  Medications reviewed: yes  Allergies reviewed: yes  Dietary changes reviewed: yes  Referrals reviewed: yes   Functional Questionnaire:   Activities of Daily Living (ADLs):   She states they are independent in the following: ambulation, bathing and hygiene, feeding, continence, grooming, toileting and dressing States they require assistance with the following: none   Any transportation issues/concerns?: no   Any patient concerns? No   Confirmed importance and date/time of follow-up visits scheduled yes  Provider Appointment booked with Dr Earlene Plater 09/14/18 2:20  Confirmed with patient if condition begins to worsen call PCP or go to the ER.  Patient was given the office number and encouraged to call back with question or concerns.  : yes

## 2018-09-10 DIAGNOSIS — R55 Syncope and collapse: Secondary | ICD-10-CM | POA: Diagnosis not present

## 2018-09-10 DIAGNOSIS — R531 Weakness: Secondary | ICD-10-CM | POA: Diagnosis not present

## 2018-09-10 DIAGNOSIS — I959 Hypotension, unspecified: Secondary | ICD-10-CM | POA: Diagnosis not present

## 2018-09-12 ENCOUNTER — Other Ambulatory Visit: Payer: Self-pay | Admitting: Internal Medicine

## 2018-09-13 ENCOUNTER — Other Ambulatory Visit: Payer: Self-pay | Admitting: *Deleted

## 2018-09-13 DIAGNOSIS — Z7984 Long term (current) use of oral hypoglycemic drugs: Secondary | ICD-10-CM | POA: Diagnosis not present

## 2018-09-13 DIAGNOSIS — I252 Old myocardial infarction: Secondary | ICD-10-CM | POA: Diagnosis not present

## 2018-09-13 DIAGNOSIS — Z96653 Presence of artificial knee joint, bilateral: Secondary | ICD-10-CM | POA: Diagnosis not present

## 2018-09-13 DIAGNOSIS — M1991 Primary osteoarthritis, unspecified site: Secondary | ICD-10-CM | POA: Diagnosis not present

## 2018-09-13 DIAGNOSIS — Z961 Presence of intraocular lens: Secondary | ICD-10-CM | POA: Diagnosis not present

## 2018-09-13 DIAGNOSIS — I5043 Acute on chronic combined systolic (congestive) and diastolic (congestive) heart failure: Secondary | ICD-10-CM | POA: Diagnosis not present

## 2018-09-13 DIAGNOSIS — I69928 Other speech and language deficits following unspecified cerebrovascular disease: Secondary | ICD-10-CM | POA: Diagnosis not present

## 2018-09-13 DIAGNOSIS — Z86711 Personal history of pulmonary embolism: Secondary | ICD-10-CM | POA: Diagnosis not present

## 2018-09-13 DIAGNOSIS — I420 Dilated cardiomyopathy: Secondary | ICD-10-CM | POA: Diagnosis not present

## 2018-09-13 DIAGNOSIS — Z9181 History of falling: Secondary | ICD-10-CM | POA: Diagnosis not present

## 2018-09-13 DIAGNOSIS — I130 Hypertensive heart and chronic kidney disease with heart failure and stage 1 through stage 4 chronic kidney disease, or unspecified chronic kidney disease: Secondary | ICD-10-CM | POA: Diagnosis not present

## 2018-09-13 DIAGNOSIS — Z7901 Long term (current) use of anticoagulants: Secondary | ICD-10-CM | POA: Diagnosis not present

## 2018-09-13 DIAGNOSIS — M069 Rheumatoid arthritis, unspecified: Secondary | ICD-10-CM | POA: Diagnosis not present

## 2018-09-13 DIAGNOSIS — D631 Anemia in chronic kidney disease: Secondary | ICD-10-CM | POA: Diagnosis not present

## 2018-09-13 DIAGNOSIS — E1122 Type 2 diabetes mellitus with diabetic chronic kidney disease: Secondary | ICD-10-CM | POA: Diagnosis not present

## 2018-09-13 DIAGNOSIS — I2510 Atherosclerotic heart disease of native coronary artery without angina pectoris: Secondary | ICD-10-CM | POA: Diagnosis not present

## 2018-09-13 DIAGNOSIS — M109 Gout, unspecified: Secondary | ICD-10-CM | POA: Diagnosis not present

## 2018-09-13 DIAGNOSIS — N183 Chronic kidney disease, stage 3 (moderate): Secondary | ICD-10-CM | POA: Diagnosis not present

## 2018-09-13 NOTE — Patient Outreach (Signed)
[facility]) Care Management  09/13/2018  KHYA HALLS 04/27/1930 [id number]    Transition of care  RN attempted outreach call today that was unsuccessful however able to leave a HIPAA approved voice message requesting a call back. Note unsuccessful contacts on last three attempts. Plan to close case on 11/7. Note outreach letter has been sent by several team members on this pt.  Will await a call back for pending services with [facility].  Elliot Cousin, RN Care Management Coordinator [facility] Main Office [phone]

## 2018-09-14 ENCOUNTER — Ambulatory Visit (INDEPENDENT_AMBULATORY_CARE_PROVIDER_SITE_OTHER): Payer: Medicare Other | Admitting: Family Medicine

## 2018-09-14 VITALS — BP 132/70 | HR 115 | Temp 98.4°F

## 2018-09-14 DIAGNOSIS — I10 Essential (primary) hypertension: Secondary | ICD-10-CM

## 2018-09-14 DIAGNOSIS — D638 Anemia in other chronic diseases classified elsewhere: Secondary | ICD-10-CM

## 2018-09-14 DIAGNOSIS — R55 Syncope and collapse: Secondary | ICD-10-CM | POA: Diagnosis not present

## 2018-09-14 DIAGNOSIS — E538 Deficiency of other specified B group vitamins: Secondary | ICD-10-CM | POA: Diagnosis not present

## 2018-09-14 DIAGNOSIS — Z09 Encounter for follow-up examination after completed treatment for conditions other than malignant neoplasm: Secondary | ICD-10-CM

## 2018-09-14 DIAGNOSIS — R600 Localized edema: Secondary | ICD-10-CM

## 2018-09-14 LAB — CBC WITH DIFFERENTIAL/PLATELET
Basophils Absolute: 0.1 10*3/uL (ref 0.0–0.1)
Basophils Relative: 1 % (ref 0.0–3.0)
Eosinophils Absolute: 0.1 10*3/uL (ref 0.0–0.7)
Eosinophils Relative: 0.7 % (ref 0.0–5.0)
HCT: 35.2 % — ABNORMAL LOW (ref 36.0–46.0)
Hemoglobin: 11.6 g/dL — ABNORMAL LOW (ref 12.0–15.0)
Lymphocytes Relative: 12.3 % (ref 12.0–46.0)
Lymphs Abs: 1.1 10*3/uL (ref 0.7–4.0)
MCHC: 32.8 g/dL (ref 30.0–36.0)
MCV: 93 fl (ref 78.0–100.0)
Monocytes Absolute: 0.4 10*3/uL (ref 0.1–1.0)
Monocytes Relative: 4.5 % (ref 3.0–12.0)
Neutro Abs: 7.4 10*3/uL (ref 1.4–7.7)
Neutrophils Relative %: 81.5 % — ABNORMAL HIGH (ref 43.0–77.0)
Platelets: 264 10*3/uL (ref 150.0–400.0)
RBC: 3.79 Mil/uL — ABNORMAL LOW (ref 3.87–5.11)
RDW: 20 % — ABNORMAL HIGH (ref 11.5–15.5)
WBC: 9.1 10*3/uL (ref 4.0–10.5)

## 2018-09-14 LAB — VITAMIN B12: Vitamin B-12: 441 pg/mL (ref 211–911)

## 2018-09-14 LAB — BASIC METABOLIC PANEL
BUN: 23 mg/dL (ref 6–23)
CO2: 24 mEq/L (ref 19–32)
Calcium: 9.9 mg/dL (ref 8.4–10.5)
Chloride: 102 mEq/L (ref 96–112)
Creatinine, Ser: 1.24 mg/dL — ABNORMAL HIGH (ref 0.40–1.20)
GFR: 52.69 mL/min — ABNORMAL LOW (ref >60.00)
Glucose, Bld: 124 mg/dL — ABNORMAL HIGH (ref 70–99)
Potassium: 4 mEq/L (ref 3.5–5.1)
Sodium: 136 mEq/L (ref 135–145)

## 2018-09-14 NOTE — Progress Notes (Signed)
[name] [name] is a 84 y.o. female is here for follow up from her most recent hospital stay.  History of Present Illness:   HPI:   1) Vasovagal syncope: Patient was admitted with syncope in the setting of bearing down while going to the bathroom.  Her labs were notable for what appeared to be mild dehydration likely secondary to her significant diuretics.  She was given IV fluids.  She did have a recent echo that was unremarkable.  A CT a during admission showed that she did have known pulmonary emboli but no progression of these.  She was evaluated by physical therapy and was recommended only for home health intermittent supervision.  She was given instructions on constipation.  Patient states she has difficulty with her bowels and has to hurry to make it to the bathroom when she needs to have a bowel movement.  Patient will start trying fiber chewables, such as Benefiber, to help aid with bowel movements and decrease constipation. Discussion re: lack of gallbladder and diarrhea. Patient states that she rarely eats fatty or fried foods. States she felt weak in her arms and legs this morning and slightly lightheaded. [name] vitals are stable today and does not feel her weight has changed significantly.  She states she has been checking her BP at home.  She drinks a lot of water at home and would like to cut back because the Lasix makes her have to use the bathroom a lot.  Patient has been watching her weight very closely due to swelling and has had an 8 pound weight change since last visit.  She has also been started on Entresto.  Patient is taking several medications that can lower her blood pressure. She will see her cardiologist in 6 months.    2) Non-obstructive coronary artery disease/hypertension/hyperlipidemia: Patient was continued on home carvedilol, isosorbide mononitrate, hydralazine, spironolactone, aspirin, sacubitril/valsartan.  [name] heart rate is elevated in the office today.   She has an appointment with her cardiologist in 6 months.  Will try decreasing furosemide to 20 mg to see how patient does.  3) Stage III CKD: This was stable.  4) Chronic diastolic grade 1 heart failure: Home diuretics were held while she was given gentle IV fluids as she was felt to be mildly dehydrated.  She was sent home on previous Lasix does.  Patient will decrease her furosemide to 20 mg and weigh herself daily.  If her weight increased by more than 3 pounds per day, she is to increase her furosemide to 40 mg on that day.   5) Hypoproliferative anemia: Patient was continued on home iron supplementation.  Ferritin level was 184 three months ago.  Hemoglobin is 11.2.  Will have her hold the iron as it seems to also cause stomach issues.  6) History of PE: Patient was continued on home rivaroxaban.  7) Rheumatoid arthritis: Patient was continued on home hydroxychloroquine, leflunomide prednisone. Pain is waxing and waning.  8) Type 2 diabetes: Patient was maintained on sliding scale here and told to restart her Metformin upon DC. With her concerns re: pain and urgency with BM as well as low A1c, will hold Metformin.  9) Pain/psych: Patient was continued on home gabapentin.  [name] family has been concerned that patient may be experiencing some sadness lately.  Patient enjoys having her family and grandchildren around her. Family is afraid to leave her alone because of what has been happening to her recently with her syncopal episodes.  Patient  states she has a bedside commode and will not get out of bed at night alone.  States she has no pain.  She does no housework at home and states she does no cooking at home.  Health Maintenance Due  Topic Date Due  . TETANUS/TDAP  04/14/2015  . OPHTHALMOLOGY EXAM  09/12/2015  . PNA vac Low Risk Adult (2 of 2 - PCV13) 09/11/2016   Depression screen PHQ 2/9 06/28/2017 03/29/2017  Decreased Interest 0 0  Down, Depressed, Hopeless 0 0  PHQ - 2  Score 0 0  Some recent data might be hidden   PMHx, SurgHx, SocialHx, FamHx, Medications, and Allergies were reviewed in the Visit Navigator and updated as appropriate.   Patient Active Problem List   Diagnosis Date Noted  . Physical deconditioning 09/06/2018  . Chronic anemia 09/06/2018  . Chronic pain 09/06/2018  . Hyperlipidemia 09/06/2018  . Chronic congestive heart failure (HCC) 09/06/2018  . Syncope 09/05/2018  . Refusal of statin medication by patient 09/04/2018  . Deep vein thrombosis (DVT) of distal vein of right lower extremity (HCC) 07/30/2018  . Hypertensive heart disease with heart failure (HCC) 07/30/2018  . Pulmonary embolism (HCC), 07/2018, on Xarelto with goal to DC after 6 months if more active 07/10/2018  . Hypertension 06/18/2018  . DCM (dilated cardiomyopathy) (HCC)   . Benign hypertensive heart and kidney disease with CHF and stage 3 chronic kidney disease (HCC) 05/28/2018  . Osteoarthritis 05/06/2018  . Hypokalemia 02/02/2018  . Adhesive capsulitis of right shoulder 10/12/2017  . Idiopathic chronic gout of multiple sites with tophus 08/25/2017  . Rheumatic nodule 06/28/2017  . History of total knee arthroplasty, bilateral 02/18/2017  . History of rotator cuff surgery 02/18/2017  . Obesity (BMI 30.0-34.9) 12/08/2016  . High risk medications, long-term use 09/21/2016  . Hammer toe 10/31/2015  . Chronic combined systolic and diastolic CHF (congestive heart failure) (HCC) 09/12/2015  . Midline low back pain without sciatica 06/27/2014  . Bilateral edema of lower extremity 06/05/2014  . CKD stage 3 due to type 2 diabetes mellitus (HCC) 06/05/2014  . DM (diabetes mellitus), type 2 with renal complications (HCC) 06/05/2014    Class: Chronic  . Anemia of chronic disease 05/01/2014  . Fluctuating blood pressure 02/06/2014  . Stroke (HCC) 01/17/2014  . Rheumatoid arthritis involving multiple sites (HCC) 12/19/2013    Class: Chronic  . CAD (coronary artery disease)  10/18/2013  . Hypertension associated with diabetes (HCC) 08/08/2013    Class: Chronic  . Fibromyalgia 05/10/2013  . Reactive airway disease 04/19/2013  . Gout 04/19/2013  . Type 2 diabetes mellitus with hyperlipidemia (HCC), patient declines statin 04/27/2007  . Allergic rhinitis 04/27/2007  . Diverticulosis 04/27/2007   Social History   Tobacco Use  . Smoking status: Never Smoker  . Smokeless tobacco: Never Used  Substance Use Topics  . Alcohol use: No    Alcohol/week: 0.0 standard drinks  . Drug use: No   Current Medications and Allergies:   .  albuterol (PROVENTIL HFA;VENTOLIN HFA) 108 (90 Base) MCG/ACT inhaler, Inhale 1-2 puffs into the lungs every 6 (six) hours as needed for wheezing or shortness of breath. , Disp: , Rfl:  .  allopurinol (ZYLOPRIM) 100 MG tablet, Take 100 mg by mouth at bedtime. , Disp: , Rfl:  .  carvedilol (COREG) 6.25 MG tablet, Take 1 tablet (6.25 mg total) by mouth 2 (two) times daily with a meal., Disp: , Rfl:  .  D3-1000 1000 units capsule, Take 1,000 Units  by mouth daily., Disp: , Rfl: 0 .  diclofenac sodium (VOLTAREN) 1 % GEL, Apply 1 application topically daily. (Patient taking differently: Apply 1 application topically 4 (four) times daily as needed (pain). Apply to painful areas of shoulders and arms daily as directed), Disp: 100 g, Rfl: 4 .  ENSURE (ENSURE), Take 237 mLs by mouth every evening. , Disp: , Rfl:  .  ferrous sulfate 325 (65 FE) MG tablet, Take 1 tablet (325 mg total) by mouth 2 (two) times daily with a meal., Disp: 60 tablet, Rfl: 0 .  furosemide (LASIX) 40 MG tablet, Take 1 tablet (40 mg total) by mouth daily., Disp: 30 tablet, Rfl: 0 .  gabapentin (NEURONTIN) 100 MG capsule, Take 1 capsule (100 mg total) by mouth at bedtime., Disp: 30 capsule, Rfl: 3 .  hydrALAZINE (APRESOLINE) 100 MG tablet, Take 1 tablet by mouth 3 (three) times daily., Disp: , Rfl:  .  hydroxychloroquine (PLAQUENIL) 200 MG tablet, Take 200 mg by mouth 2 (two) times  daily., Disp: , Rfl:  .  hydroxypropyl methylcellulose / hypromellose (ISOPTO TEARS / GONIOVISC) 2.5 % ophthalmic solution, Place 1 drop into both eyes daily., Disp: , Rfl:  .  isosorbide mononitrate (IMDUR) 30 MG 24 hr tablet, Take 1 tablet (30 mg total) by mouth at bedtime., Disp: , Rfl:  .  leflunomide (ARAVA) 20 MG tablet, Take 20 mg by mouth daily., Disp: , Rfl:  .  metFORMIN (GLUCOPHAGE) 500 MG tablet, Take 1 tablet (500 mg total) by mouth daily with breakfast., Disp: 90 tablet, Rfl: 2 .  Olopatadine HCl 0.2 % SOLN, Place 1 drop into both eyes daily., Disp: , Rfl:  .  potassium chloride SA (K-DUR,KLOR-CON) 20 MEQ tablet, Take 1 tablet (20 mEq total) by mouth 2 (two) times daily., Disp: , Rfl:  .  predniSONE (DELTASONE) 10 MG tablet, Take 1 tablet (10 mg total) by mouth daily with breakfast., Disp: 5 tablet, Rfl: 0 .  rivaroxaban (XARELTO) 20 MG TABS tablet, Take 20 mg by mouth daily with supper., Disp: , Rfl:  .  sacubitril-valsartan (ENTRESTO) 49-51 MG, Take 1 tablet by mouth daily at 12 noon. , Disp: , Rfl:  .  spironolactone (ALDACTONE) 25 MG tablet, Take 12.5 mg by mouth daily., Disp: , Rfl:  .  traMADol (ULTRAM) 50 MG tablet, Take 1 tablet by mouth every 6 (six) hours as needed for moderate pain or severe pain. , Disp: , Rfl: 0 .  triamcinolone cream (KENALOG) 0.1 %, Apply 1 application topically 2 (two) times daily as needed (for itching- to affected sites). , Disp: , Rfl:  .  vitamin C (ASCORBIC ACID) 500 MG tablet, Take 500 mg by mouth daily., Disp: , Rfl:    Allergies  Allergen Reactions  . Clonidine Derivatives Swelling    [name]  . Fish Allergy Diarrhea, Swelling and Other (See Comments)    Turns skin "black," but can tolerate white fish Salmon- Diarrhea  . Shellfish Allergy Hives  . Doxycycline Rash  . Indomethacin Other (See Comments)    Reaction not recalled by the patient  . Lyrica [Pregabalin]  Other (See Comments)    Hallucinations   . Methyldopa Other (See Comments)    Aldomet (for hypertension): Reaction not recalled by the patient  . Morphine And Related Other (See Comments)    Family reports it drops her bp that she needs iv fluids  . Orange Fruit [Citrus] Other (See Comments)  Indigestion/heartburn  . Strawberry (Diagnostic) Itching  . Cetirizine Hcl Itching and Rash  . Codeine Itching  . Levaquin [Levofloxacin In D5w] Rash  . Tomato Rash   Review of Systems   Pertinent items are noted in the HPI. Otherwise, ROS is negative.  Vitals:   Vitals:   09/14/18 1420  BP: 132/70  Pulse: (!) 115  Temp: 98.4 F (36.9 C)  TempSrc: Oral  SpO2: 97%     There is no height or weight on file to calculate BMI.  Physical Exam:   Physical Exam  Constitutional: She is oriented to person, place, and time. She appears well-nourished.  Patient looks tired today. Sitting in wheelchair.  HENT:  Head: Normocephalic and atraumatic.  Eyes: Pupils are equal, round, and reactive to light. EOM are normal.  Neck: Normal range of motion. Neck supple.  Cardiovascular: Regular rhythm and intact distal pulses.  No extrasystoles are present. Tachycardia present.  Pulmonary/Chest: Effort normal.  Abdominal: Soft.  Musculoskeletal: She exhibits no edema.  Neurological: She is alert and oriented to person, place, and time.  Skin: Skin is warm.  Previous lower extremity nodules healed.   Psychiatric: She has a normal mood and affect. Her behavior is normal.  Nursing note and vitals reviewed.  Lab Results  Component Value Date   WBC 9.1 09/14/2018   HGB 11.6 (L) 09/14/2018   HCT 35.2 (L) 09/14/2018   MCV 93.0 09/14/2018   PLT 264.0 09/14/2018   Lab Results  Component Value Date   IRON 19 06/08/2018   TIBC 183 06/08/2018   FERRITIN 184.10 06/08/2018   Lab Results  Component Value Date   CREATININE 1.24 (H) 09/14/2018   BUN 23 09/14/2018   NA 136 09/14/2018   K 4.0  09/14/2018   CL 102 09/14/2018   CO2 24 09/14/2018   Lab Results  Component Value Date   TSH 1.742 06/23/2018   Lab Results  Component Value Date   CHOL 202 (H) 06/25/2018   HDL 63 06/25/2018   LDLCALC 126 (H) 06/25/2018   TRIG 67 06/25/2018   CHOLHDL 3.2 06/25/2018   Wt Readings from Last 3 Encounters:  09/07/18 175 lb 7.8 oz (79.6 kg)  08/31/18 177 lb (80.3 kg)  08/29/18 177 lb (80.3 kg)   Lab Results  Component Value Date   HGBA1C 5.9 (H) 06/25/2018   Assessment and Plan:   [name] [name] was seen today for follow-up. See above for discussion of diagnoses and plan.  [name] [name] was seen today for hospitalization follow-up.  Diagnoses and all orders for this visit:  Hospital discharge follow-up Comments: Medication reconciliation:  [x]   Medication list updated [x]   New medication list given to patient/family/caregiver  Referrals: []   None needed []   Referrals made to: Cardiology to discuss multiple medications  Community resources identified for patient/family:  []   None needed  [x]   Home health agency []   Assisted living  []   Hospice  []   Support group  []   Education program  Durable medical equipment ordered:  [x]   None needed  []   DME ordered:   Additional communication delivered or planned:  [x]   Family/Caregiver:  [x]   Specialists:  []   Other:  Patient education: Topics discussed: AS ABOVE Handouts given: SEE AVS  Initial transitional care contact was made on 09/08/18 (see separate note).  Anemia of chronic disease -     CBC with Differential/Platelet -     Basic metabolic panel  Essential hypertension -  CBC with Differential/Platelet -     Basic metabolic panel  Vasovagal syncope -     CBC with Differential/Platelet -     Basic metabolic panel  B12 deficiency -     B12  . Reviewed expectations re: course of current medical issues. . Discussed self-management of symptoms. . Outlined signs and symptoms indicating need for more  acute intervention. . Patient verbalized understanding and all questions were answered. Marland Kitchen Health Maintenance issues including appropriate healthy diet, exercise, and smoking avoidance were discussed with patient. . See orders for this visit as documented in the electronic medical record. . Patient received an After Visit Summary.  CMA served as Neurosurgeon during this visit. History, Physical, and Plan performed by medical provider. The above documentation has been reviewed and is accurate and complete. Helane Rima, D.O.  Helane Rima, DO [place] 09/15/2018

## 2018-09-14 NOTE — Patient Instructions (Addendum)
Decrease furosemide to 20 mg daily.  Weigh yourself daily.  If your weight increases by more than 3 pounds, increase your furosemide to 40 mg.  Stop taking your iron.  Stop metformin.

## 2018-09-15 ENCOUNTER — Encounter: Payer: Self-pay | Admitting: Family Medicine

## 2018-09-15 MED ORDER — FUROSEMIDE 40 MG PO TABS: 20.0000 mg | ORAL_TABLET | Freq: Every day | ORAL | 0 refills | Status: DC

## 2018-09-15 NOTE — Addendum Note (Signed)
Addended by: Helane Rima R on: 09/15/2018 02:20 PM   Modules accepted: Orders

## 2018-09-16 ENCOUNTER — Telehealth: Payer: Self-pay | Admitting: *Deleted

## 2018-09-16 ENCOUNTER — Other Ambulatory Visit: Payer: Self-pay | Admitting: Internal Medicine

## 2018-09-16 DIAGNOSIS — S81801D Unspecified open wound, right lower leg, subsequent encounter: Secondary | ICD-10-CM

## 2018-09-16 NOTE — Telephone Encounter (Signed)
fyi

## 2018-09-16 NOTE — Telephone Encounter (Signed)
Copied from CRM [id number]. Topic: General - Other >> Sep 16, 2018 10:08 AM Marylen Ponto wrote: Reason for CRM: Elmyra Ricks with [facility] states pt will start home services care on Sunday 09/18/18. Cb# [phone]

## 2018-09-16 NOTE — Telephone Encounter (Signed)
Copied from CRM [id number]. Topic: Referral - Request for Referral >> Sep 16, 2018 11:22 AM Gerrianne Scale wrote: Has patient seen PCP for this complaint? Yes.   *If NO, is insurance requiring patient see PCP for this issue before PCP can refer them? Referral for which specialty: Rheumatologist Preferred provider/office: anywhere local per her daughter [name] Reason for referral: rheumatoid arthritis

## 2018-09-16 NOTE — Telephone Encounter (Signed)
Ok to send referral  

## 2018-09-17 DIAGNOSIS — M1991 Primary osteoarthritis, unspecified site: Secondary | ICD-10-CM | POA: Diagnosis not present

## 2018-09-17 DIAGNOSIS — M109 Gout, unspecified: Secondary | ICD-10-CM | POA: Diagnosis not present

## 2018-09-17 DIAGNOSIS — Z86711 Personal history of pulmonary embolism: Secondary | ICD-10-CM | POA: Diagnosis not present

## 2018-09-17 DIAGNOSIS — M069 Rheumatoid arthritis, unspecified: Secondary | ICD-10-CM | POA: Diagnosis not present

## 2018-09-17 DIAGNOSIS — Z7984 Long term (current) use of oral hypoglycemic drugs: Secondary | ICD-10-CM | POA: Diagnosis not present

## 2018-09-17 DIAGNOSIS — Z9181 History of falling: Secondary | ICD-10-CM | POA: Diagnosis not present

## 2018-09-17 DIAGNOSIS — D631 Anemia in chronic kidney disease: Secondary | ICD-10-CM | POA: Diagnosis not present

## 2018-09-17 DIAGNOSIS — I69928 Other speech and language deficits following unspecified cerebrovascular disease: Secondary | ICD-10-CM | POA: Diagnosis not present

## 2018-09-17 DIAGNOSIS — I420 Dilated cardiomyopathy: Secondary | ICD-10-CM | POA: Diagnosis not present

## 2018-09-17 DIAGNOSIS — I252 Old myocardial infarction: Secondary | ICD-10-CM | POA: Diagnosis not present

## 2018-09-17 DIAGNOSIS — N183 Chronic kidney disease, stage 3 (moderate): Secondary | ICD-10-CM | POA: Diagnosis not present

## 2018-09-17 DIAGNOSIS — I130 Hypertensive heart and chronic kidney disease with heart failure and stage 1 through stage 4 chronic kidney disease, or unspecified chronic kidney disease: Secondary | ICD-10-CM | POA: Diagnosis not present

## 2018-09-17 DIAGNOSIS — I5043 Acute on chronic combined systolic (congestive) and diastolic (congestive) heart failure: Secondary | ICD-10-CM | POA: Diagnosis not present

## 2018-09-17 DIAGNOSIS — Z961 Presence of intraocular lens: Secondary | ICD-10-CM | POA: Diagnosis not present

## 2018-09-17 DIAGNOSIS — I2510 Atherosclerotic heart disease of native coronary artery without angina pectoris: Secondary | ICD-10-CM | POA: Diagnosis not present

## 2018-09-17 DIAGNOSIS — Z96653 Presence of artificial knee joint, bilateral: Secondary | ICD-10-CM | POA: Diagnosis not present

## 2018-09-17 DIAGNOSIS — Z7901 Long term (current) use of anticoagulants: Secondary | ICD-10-CM | POA: Diagnosis not present

## 2018-09-17 DIAGNOSIS — E1122 Type 2 diabetes mellitus with diabetic chronic kidney disease: Secondary | ICD-10-CM | POA: Diagnosis not present

## 2018-09-17 NOTE — Telephone Encounter (Signed)
Okay to send. I was also going to look at pill packs through [facility]. Please make sure that I did this. Also - she has a current Rheumatologist, so see if she has a preference.

## 2018-09-19 ENCOUNTER — Other Ambulatory Visit: Payer: Self-pay | Admitting: *Deleted

## 2018-09-19 ENCOUNTER — Other Ambulatory Visit: Payer: Self-pay

## 2018-09-19 DIAGNOSIS — N183 Chronic kidney disease, stage 3 unspecified: Secondary | ICD-10-CM

## 2018-09-19 DIAGNOSIS — M0579 Rheumatoid arthritis with rheumatoid factor of multiple sites without organ or systems involvement: Secondary | ICD-10-CM

## 2018-09-19 NOTE — Progress Notes (Signed)
t

## 2018-09-19 NOTE — Telephone Encounter (Signed)
Both referrals placed.

## 2018-09-19 NOTE — Patient Outreach (Signed)
[facility]) Care Management  09/19/2018  [name] [name] 04-21-1930 [id number]  CASE CLOSURE  Several outreach calls and a letter that was previous sent to pt with no response however RN has spoken daughter several weeks ago who response to call left to pt's voice mailbox but there has not been a response since that time. Case will be closed and provider notified that RN has not been able to reach this pt for ongoing services.   Elliot Cousin, RN Care Management Coordinator [facility] Main Office [phone]

## 2018-09-20 ENCOUNTER — Telehealth: Payer: Self-pay | Admitting: *Deleted

## 2018-09-20 ENCOUNTER — Telehealth: Payer: Self-pay | Admitting: Family Medicine

## 2018-09-20 NOTE — Telephone Encounter (Signed)
Copied from CRM [id number]. Topic: General - Other >> Sep 20, 2018  4:23 PM Elliot Gault wrote: Jethro Bolus name: Barbette Or  Relation to pt: RN from [facility]  Call back number: [phone]   Reason for call:  Requesting verbal orders for continuation for home care services for medication management and disease process, 1x 8 please advise

## 2018-09-20 NOTE — Telephone Encounter (Signed)
Meds per our list. I think that we also put in referral for [facility] but closed due to not getting in touch.

## 2018-09-20 NOTE — Telephone Encounter (Signed)
What do you want her on?

## 2018-09-20 NOTE — Telephone Encounter (Signed)
Copied from CRM [id number]. Topic: General - Other >> Sep 20, 2018  1:05 PM Leafy Ro wrote: Reason for CRM:pt daughter Lattie Corns is calling and has a question concerning carvedilol . The med list has different strength  6.25 mg and pill bottle has 25 mg. Please clarify which strength pt should be on. Pt daughter would like to know if her mother should still be taking spironolactone 25 mg

## 2018-09-21 NOTE — Telephone Encounter (Signed)
Called Genevive gave verbal orders. Will call if any problems.

## 2018-09-30 ENCOUNTER — Telehealth: Payer: Self-pay | Admitting: Family Medicine

## 2018-09-30 DIAGNOSIS — M0579 Rheumatoid arthritis with rheumatoid factor of multiple sites without organ or systems involvement: Secondary | ICD-10-CM

## 2018-09-30 NOTE — Telephone Encounter (Signed)
See note  Copied from CRM [id number]. Topic: Referral - Request for Referral >> Sep 16, 2018 11:22 AM Gerrianne Scale wrote: Has patient seen PCP for this complaint? Yes.   *If NO, is insurance requiring patient see PCP for this issue before PCP can refer them? Referral for which specialty: Rheumatologist Preferred provider/office: anywhere local per her daughter [name] Reason for referral: rheumatoid arthritis >> Sep 30, 2018  2:45 PM Gean Birchwood R wrote: Patients daughter is requesting for another referral due to denial

## 2018-09-30 NOTE — Telephone Encounter (Signed)
Dr. Earlene Plater, referral was denied by Dr. Corliss Skains. Okay to send another referral for local place?

## 2018-10-01 NOTE — Telephone Encounter (Signed)
Okay new referral. 

## 2018-10-03 NOTE — Addendum Note (Signed)
Addended by: Jimmye Norman on: 10/03/2018 08:42 AM   Modules accepted: Orders

## 2018-10-03 NOTE — Telephone Encounter (Signed)
New referral placed for Rheumatology.

## 2018-10-04 NOTE — Telephone Encounter (Signed)
Pt daughter Para March is calling and would like dr Deanne Coffer at [facility] phone [phone]

## 2018-10-04 NOTE — Telephone Encounter (Signed)
See note

## 2018-10-07 ENCOUNTER — Other Ambulatory Visit: Payer: Self-pay | Admitting: Internal Medicine

## 2018-10-10 ENCOUNTER — Ambulatory Visit: Payer: Self-pay

## 2018-10-10 ENCOUNTER — Telehealth: Payer: Self-pay | Admitting: *Deleted

## 2018-10-10 NOTE — Telephone Encounter (Signed)
Called number l/m to call office.

## 2018-10-10 NOTE — Telephone Encounter (Signed)
Pt.'s daughter called to report pt.'s BP now is 179/95. BS 226, pulse 91. Pt. Is "very weak this morning.She got up to get cleaned up, but had to go right back to bed." "This is not like her - the last time this happened, we had to call EMS and go to the ED." Instructed daughter to call EMS to take pt. To ED for evaluation. Verbalizes understanding.  Reason for Disposition . [1] Systolic BP  >= 160 OR Diastolic >= 100 AND [2] cardiac or neurologic symptoms (e.g., chest pain, difficulty breathing, unsteady gait, blurred vision)  Answer Assessment - Initial Assessment Questions 1. BLOOD PRESSURE: "What is the blood pressure?" "Did you take at least two measurements 5 minutes apart?"     179/95 2. ONSET: "When did you take your blood pressure?"      A few minutes ago 3. HOW: "How did you obtain the blood pressure?" (e.g., visiting nurse, automatic home BP monitor)     Home BP 4. HISTORY: "Do you have a history of high blood pressure?"     Yes 5. MEDICATIONS: "Are you taking any medications for blood pressure?" "Have you missed any doses recently?"     No 6. OTHER SYMPTOMS: "Do you have any symptoms?" (e.g., headache, chest pain, blurred vision, difficulty breathing, weakness)     Just doesn't feel good 7. PREGNANCY: "Is there any chance you are pregnant?" "When was your last menstrual period?"     N/a  Protocols used: HIGH BLOOD PRESSURE-A-AH

## 2018-10-10 NOTE — Telephone Encounter (Signed)
Added to other note.

## 2018-10-10 NOTE — Telephone Encounter (Signed)
Medication question: same as November telephone note? Did patient go to ED? Need to follow up and ask.

## 2018-10-10 NOTE — Telephone Encounter (Signed)
Copied from CRM [id number]. Topic: General - Other >> Sep 20, 2018  1:05 PM Leafy Ro wrote: Reason for CRM:pt [name] is calling and has a question concerning carvedilol . The med list has different strength  6.25 mg and pill bottle has 25 mg. Please clarify which strength pt should be on. Pt daughter would like to know if her mother should still be taking spironolactone 25 mg >> Oct 10, 2018 11:08 AM Trula Slade wrote: [name] [name] w/[facility] [phone] would like to know if skill nursing can see the patient, 1w3, and a PT evaluation.

## 2018-10-10 NOTE — Telephone Encounter (Signed)
Also on another phone message.   Copied from CRM [id number]. Topic: General - Other >> Sep 20, 2018  1:05 PM Leafy Ro wrote: Reason for CRM:pt daughter Lattie Corns is calling and has a question concerning carvedilol . The med list has different strength  6.25 mg and pill bottle has 25 mg. Please clarify which strength pt should be on. Pt daughter would like to know if her mother should still be taking spironolactone 25 mg >> Oct 10, 2018 11:08 AM Trula Slade wrote: Claris Che w/[facility] [phone] would like to know if skill nursing can see the patient, 1w3, and a PT evaluation

## 2018-10-11 NOTE — Telephone Encounter (Addendum)
Left voicemail requesting call back. No ED visit in Epic.

## 2018-10-12 ENCOUNTER — Other Ambulatory Visit: Payer: Self-pay

## 2018-10-12 NOTE — Patient Outreach (Signed)
[facility] Customer [facility] manager [facility]) Care Management  10/12/2018  CARNISHA FELTZ Jul 26, 1930 [id number]   Medication Adherence call to Mrs. [name] [name] left a message for patient is past due on Metformin 500 mg. Mrs. Hargreaves is showing past due under [facility].   Lillia Abed CPhT Pharmacy Technician [facility] Care Management Direct Dial [phone]  Fax [phone] [email or web address]

## 2018-10-13 NOTE — Telephone Encounter (Signed)
FYI not able to get patient.

## 2018-10-14 ENCOUNTER — Ambulatory Visit: Payer: Medicare Other | Admitting: Family Medicine

## 2018-10-14 ENCOUNTER — Telehealth: Payer: Self-pay | Admitting: Family Medicine

## 2018-10-14 NOTE — Telephone Encounter (Signed)
Copied from CRM [id number]. Topic: Quick Communication - Home Health Verbal Orders >> Oct 14, 2018 11:15 AM Angela Nevin wrote: Caller/Agency: Claris Che with [facility] Callback Number: [phone] Requesting OT/PT/Skilled Nursing/Social Work: Skilled Nursing, Requesting reinstitution of care 1w1 Also requesting PT re-certification   Claris Che wanted to note they are requesting the SN reinstitution of care because previous order of "resumption of care" did not go through as Claris Che states RN that went out was new to the company and did not understant 1w3 order from 11/9.

## 2018-10-15 ENCOUNTER — Other Ambulatory Visit: Payer: Self-pay | Admitting: Internal Medicine

## 2018-10-17 ENCOUNTER — Other Ambulatory Visit: Payer: Self-pay | Admitting: Internal Medicine

## 2018-10-18 ENCOUNTER — Other Ambulatory Visit: Payer: Self-pay | Admitting: Family Medicine

## 2018-10-18 DIAGNOSIS — M19041 Primary osteoarthritis, right hand: Secondary | ICD-10-CM | POA: Diagnosis not present

## 2018-10-18 DIAGNOSIS — M79641 Pain in right hand: Secondary | ICD-10-CM | POA: Diagnosis not present

## 2018-10-18 DIAGNOSIS — M109 Gout, unspecified: Secondary | ICD-10-CM | POA: Diagnosis not present

## 2018-10-18 DIAGNOSIS — N189 Chronic kidney disease, unspecified: Secondary | ICD-10-CM | POA: Diagnosis not present

## 2018-10-18 DIAGNOSIS — M19042 Primary osteoarthritis, left hand: Secondary | ICD-10-CM | POA: Diagnosis not present

## 2018-10-18 DIAGNOSIS — I10 Essential (primary) hypertension: Secondary | ICD-10-CM

## 2018-10-18 DIAGNOSIS — M79642 Pain in left hand: Secondary | ICD-10-CM | POA: Diagnosis not present

## 2018-10-18 DIAGNOSIS — M069 Rheumatoid arthritis, unspecified: Secondary | ICD-10-CM | POA: Diagnosis not present

## 2018-10-18 DIAGNOSIS — M19071 Primary osteoarthritis, right ankle and foot: Secondary | ICD-10-CM | POA: Diagnosis not present

## 2018-10-18 DIAGNOSIS — M79671 Pain in right foot: Secondary | ICD-10-CM | POA: Diagnosis not present

## 2018-10-18 DIAGNOSIS — M19072 Primary osteoarthritis, left ankle and foot: Secondary | ICD-10-CM | POA: Diagnosis not present

## 2018-10-18 DIAGNOSIS — E1169 Type 2 diabetes mellitus with other specified complication: Secondary | ICD-10-CM | POA: Diagnosis not present

## 2018-10-18 NOTE — Telephone Encounter (Signed)
Last OV 09/14/18 Next OV 10/21/18  Per note from 07/28/18 -  Secondary to hypotension patient's Coreg and hydralazine were resumed but at a lower dose to be tried treated as an outpatient as needed.  Per note, looks like rx was changed to 1 tab po at bedtime but it doesn't appear to have been updated on med list.   Forwarding to Dr. Earlene Plater to advise.

## 2018-10-18 NOTE — Telephone Encounter (Signed)
Last OV 09/14/18 Last refill 03/25/18 #5/0 Next OV 10/21/18  OK to refill?

## 2018-10-20 NOTE — Telephone Encounter (Signed)
Verbal given 

## 2018-10-21 ENCOUNTER — Telehealth: Payer: Self-pay | Admitting: *Deleted

## 2018-10-21 ENCOUNTER — Other Ambulatory Visit: Payer: Self-pay

## 2018-10-21 ENCOUNTER — Ambulatory Visit: Payer: Medicare Other | Admitting: Family Medicine

## 2018-10-21 ENCOUNTER — Emergency Department (HOSPITAL_COMMUNITY): Payer: Medicare Other

## 2018-10-21 ENCOUNTER — Observation Stay (HOSPITAL_COMMUNITY)
Admission: EM | Admit: 2018-10-21 | Discharge: 2018-10-25 | Disposition: A | Discharge status: Home / Self Care | Payer: Medicare Other | Attending: Internal Medicine | Admitting: Internal Medicine

## 2018-10-21 ENCOUNTER — Encounter (HOSPITAL_COMMUNITY): Payer: Self-pay | Admitting: Emergency Medicine

## 2018-10-21 ENCOUNTER — Telehealth: Payer: Self-pay | Admitting: Family Medicine

## 2018-10-21 DIAGNOSIS — E860 Dehydration: Secondary | ICD-10-CM | POA: Insufficient documentation

## 2018-10-21 DIAGNOSIS — I252 Old myocardial infarction: Secondary | ICD-10-CM | POA: Insufficient documentation

## 2018-10-21 DIAGNOSIS — M797 Fibromyalgia: Secondary | ICD-10-CM | POA: Diagnosis not present

## 2018-10-21 DIAGNOSIS — M5136 Other intervertebral disc degeneration, lumbar region: Principal | ICD-10-CM | POA: Insufficient documentation

## 2018-10-21 DIAGNOSIS — M25551 Pain in right hip: Secondary | ICD-10-CM | POA: Diagnosis not present

## 2018-10-21 DIAGNOSIS — I5042 Chronic combined systolic (congestive) and diastolic (congestive) heart failure: Secondary | ICD-10-CM | POA: Diagnosis not present

## 2018-10-21 DIAGNOSIS — G8929 Other chronic pain: Secondary | ICD-10-CM | POA: Insufficient documentation

## 2018-10-21 DIAGNOSIS — M069 Rheumatoid arthritis, unspecified: Secondary | ICD-10-CM | POA: Insufficient documentation

## 2018-10-21 DIAGNOSIS — Z86711 Personal history of pulmonary embolism: Secondary | ICD-10-CM | POA: Diagnosis not present

## 2018-10-21 DIAGNOSIS — I130 Hypertensive heart and chronic kidney disease with heart failure and stage 1 through stage 4 chronic kidney disease, or unspecified chronic kidney disease: Secondary | ICD-10-CM | POA: Diagnosis not present

## 2018-10-21 DIAGNOSIS — D619 Aplastic anemia, unspecified: Secondary | ICD-10-CM | POA: Diagnosis not present

## 2018-10-21 DIAGNOSIS — M4316 Spondylolisthesis, lumbar region: Secondary | ICD-10-CM | POA: Diagnosis not present

## 2018-10-21 DIAGNOSIS — M79661 Pain in right lower leg: Secondary | ICD-10-CM | POA: Diagnosis not present

## 2018-10-21 DIAGNOSIS — I420 Dilated cardiomyopathy: Secondary | ICD-10-CM | POA: Insufficient documentation

## 2018-10-21 DIAGNOSIS — I152 Hypertension secondary to endocrine disorders: Secondary | ICD-10-CM | POA: Diagnosis present

## 2018-10-21 DIAGNOSIS — E876 Hypokalemia: Secondary | ICD-10-CM

## 2018-10-21 DIAGNOSIS — E1122 Type 2 diabetes mellitus with diabetic chronic kidney disease: Secondary | ICD-10-CM | POA: Insufficient documentation

## 2018-10-21 DIAGNOSIS — Z79899 Other long term (current) drug therapy: Secondary | ICD-10-CM

## 2018-10-21 DIAGNOSIS — M4807 Spinal stenosis, lumbosacral region: Secondary | ICD-10-CM | POA: Diagnosis not present

## 2018-10-21 DIAGNOSIS — M79662 Pain in left lower leg: Secondary | ICD-10-CM | POA: Diagnosis not present

## 2018-10-21 DIAGNOSIS — Z7901 Long term (current) use of anticoagulants: Secondary | ICD-10-CM | POA: Diagnosis not present

## 2018-10-21 DIAGNOSIS — I10 Essential (primary) hypertension: Secondary | ICD-10-CM | POA: Diagnosis not present

## 2018-10-21 DIAGNOSIS — R52 Pain, unspecified: Secondary | ICD-10-CM | POA: Diagnosis not present

## 2018-10-21 DIAGNOSIS — M4800 Spinal stenosis, site unspecified: Secondary | ICD-10-CM | POA: Diagnosis present

## 2018-10-21 DIAGNOSIS — N183 Chronic kidney disease, stage 3 unspecified: Secondary | ICD-10-CM | POA: Diagnosis present

## 2018-10-21 DIAGNOSIS — I2510 Atherosclerotic heart disease of native coronary artery without angina pectoris: Secondary | ICD-10-CM | POA: Diagnosis not present

## 2018-10-21 DIAGNOSIS — Z743 Need for continuous supervision: Secondary | ICD-10-CM | POA: Diagnosis not present

## 2018-10-21 DIAGNOSIS — E1159 Type 2 diabetes mellitus with other circulatory complications: Secondary | ICD-10-CM | POA: Diagnosis present

## 2018-10-21 DIAGNOSIS — M48061 Spinal stenosis, lumbar region without neurogenic claudication: Secondary | ICD-10-CM | POA: Insufficient documentation

## 2018-10-21 DIAGNOSIS — M25559 Pain in unspecified hip: Secondary | ICD-10-CM | POA: Diagnosis not present

## 2018-10-21 DIAGNOSIS — H409 Unspecified glaucoma: Secondary | ICD-10-CM | POA: Insufficient documentation

## 2018-10-21 DIAGNOSIS — I2699 Other pulmonary embolism without acute cor pulmonale: Secondary | ICD-10-CM | POA: Diagnosis present

## 2018-10-21 DIAGNOSIS — E785 Hyperlipidemia, unspecified: Secondary | ICD-10-CM | POA: Insufficient documentation

## 2018-10-21 DIAGNOSIS — Z8673 Personal history of transient ischemic attack (TIA), and cerebral infarction without residual deficits: Secondary | ICD-10-CM | POA: Diagnosis not present

## 2018-10-21 DIAGNOSIS — M545 Low back pain: Secondary | ICD-10-CM | POA: Diagnosis not present

## 2018-10-21 DIAGNOSIS — M1A09X1 Idiopathic chronic gout, multiple sites, with tophus (tophi): Secondary | ICD-10-CM | POA: Insufficient documentation

## 2018-10-21 DIAGNOSIS — Z7952 Long term (current) use of systemic steroids: Secondary | ICD-10-CM | POA: Insufficient documentation

## 2018-10-21 DIAGNOSIS — E1169 Type 2 diabetes mellitus with other specified complication: Secondary | ICD-10-CM | POA: Diagnosis present

## 2018-10-21 DIAGNOSIS — Z885 Allergy status to narcotic agent status: Secondary | ICD-10-CM | POA: Insufficient documentation

## 2018-10-21 DIAGNOSIS — D638 Anemia in other chronic diseases classified elsewhere: Secondary | ICD-10-CM | POA: Insufficient documentation

## 2018-10-21 LAB — COMPREHENSIVE METABOLIC PANEL
ALT: 11 U/L (ref 0–44)
AST: 20 U/L (ref 15–41)
Albumin: 3.3 g/dL — ABNORMAL LOW (ref 3.5–5.0)
Alkaline Phosphatase: 41 U/L (ref 38–126)
Anion gap: 10 (ref 5–15)
BUN: 18 mg/dL (ref 8–23)
CO2: 22 mmol/L (ref 22–32)
Calcium: 8.4 mg/dL — ABNORMAL LOW (ref 8.9–10.3)
Chloride: 104 mmol/L (ref 98–111)
Creatinine, Ser: 1.01 mg/dL — ABNORMAL HIGH (ref 0.44–1.00)
GFR calc Af Amer: 58 mL/min — ABNORMAL LOW (ref >60)
GFR calc non Af Amer: 50 mL/min — ABNORMAL LOW (ref >60)
Glucose, Bld: 96 mg/dL (ref 70–99)
Potassium: 3.3 mmol/L — ABNORMAL LOW (ref 3.5–5.1)
Sodium: 136 mmol/L (ref 135–145)
Total Bilirubin: 1 mg/dL (ref 0.3–1.2)
Total Protein: 5.9 g/dL — ABNORMAL LOW (ref 6.5–8.1)

## 2018-10-21 LAB — URINALYSIS, ROUTINE W REFLEX MICROSCOPIC
Bilirubin Urine: NEGATIVE
Glucose, UA: NEGATIVE mg/dL
Hgb urine dipstick: NEGATIVE
Ketones, ur: NEGATIVE mg/dL
Leukocytes, UA: NEGATIVE
Nitrite: NEGATIVE
Protein, ur: NEGATIVE mg/dL
Specific Gravity, Urine: 1.009 (ref 1.005–1.030)
pH: 6 (ref 5.0–8.0)

## 2018-10-21 LAB — IRON AND TIBC
Iron: 17 ug/dL — ABNORMAL LOW (ref 28–170)
Saturation Ratios: 7 % — ABNORMAL LOW (ref 10.4–31.8)
TIBC: 233 ug/dL — ABNORMAL LOW (ref 250–450)
UIBC: 216 ug/dL

## 2018-10-21 LAB — FERRITIN: Ferritin: 215 ng/mL (ref 11–307)

## 2018-10-21 LAB — CBC WITH DIFFERENTIAL/PLATELET
Abs Immature Granulocytes: 0.02 10*3/uL (ref 0.00–0.07)
Basophils Absolute: 0.1 10*3/uL (ref 0.0–0.1)
Basophils Relative: 1 %
Eosinophils Absolute: 0.2 10*3/uL (ref 0.0–0.5)
Eosinophils Relative: 3 %
HCT: 31.4 % — ABNORMAL LOW (ref 36.0–46.0)
Hemoglobin: 9.8 g/dL — ABNORMAL LOW (ref 12.0–15.0)
Immature Granulocytes: 0 %
Lymphocytes Relative: 27 %
Lymphs Abs: 2.1 10*3/uL (ref 0.7–4.0)
MCH: 31 pg (ref 26.0–34.0)
MCHC: 31.2 g/dL (ref 30.0–36.0)
MCV: 99.4 fL (ref 80.0–100.0)
Monocytes Absolute: 0.6 10*3/uL (ref 0.1–1.0)
Monocytes Relative: 8 %
Neutro Abs: 4.7 10*3/uL (ref 1.7–7.7)
Neutrophils Relative %: 61 %
Platelets: 247 10*3/uL (ref 150–400)
RBC: 3.16 MIL/uL — ABNORMAL LOW (ref 3.87–5.11)
RDW: 15.9 % — ABNORMAL HIGH (ref 11.5–15.5)
WBC: 7.7 10*3/uL (ref 4.0–10.5)
nRBC: 0 % (ref 0.0–0.2)

## 2018-10-21 LAB — VITAMIN B12: Vitamin B-12: 419 pg/mL (ref 180–914)

## 2018-10-21 MED ORDER — GABAPENTIN 100 MG PO CAPS
100.0000 mg | ORAL_CAPSULE | Freq: Every day | ORAL | Status: DC
  Administered 2018-10-21 – 2018-10-24 (×4): 100 mg via ORAL
  Filled 2018-10-21 (×4): qty 1

## 2018-10-21 MED ORDER — ISOSORBIDE MONONITRATE ER 30 MG PO TB24
30.0000 mg | ORAL_TABLET | Freq: Every day | ORAL | Status: DC
  Administered 2018-10-21 – 2018-10-24 (×4): 30 mg via ORAL
  Filled 2018-10-21 (×4): qty 1

## 2018-10-21 MED ORDER — ONDANSETRON HCL 4 MG/2ML IJ SOLN
4.0000 mg | Freq: Once | INTRAMUSCULAR | Status: AC
  Administered 2018-10-21: 4 mg via INTRAVENOUS
  Filled 2018-10-21: qty 2

## 2018-10-21 MED ORDER — OLOPATADINE HCL 0.1 % OP SOLN
1.0000 [drp] | Freq: Two times a day (BID) | OPHTHALMIC | Status: DC
  Administered 2018-10-21 – 2018-10-25 (×8): 1 [drp] via OPHTHALMIC
  Filled 2018-10-21: qty 5

## 2018-10-21 MED ORDER — SPIRONOLACTONE 12.5 MG HALF TABLET
12.5000 mg | ORAL_TABLET | Freq: Every day | ORAL | Status: DC
  Administered 2018-10-21 – 2018-10-25 (×5): 12.5 mg via ORAL
  Filled 2018-10-21 (×5): qty 1

## 2018-10-21 MED ORDER — TRAMADOL HCL 50 MG PO TABS
50.0000 mg | ORAL_TABLET | Freq: Two times a day (BID) | ORAL | Status: DC
  Administered 2018-10-21 – 2018-10-25 (×8): 50 mg via ORAL
  Filled 2018-10-21 (×8): qty 1

## 2018-10-21 MED ORDER — SACUBITRIL-VALSARTAN 49-51 MG PO TABS
1.0000 | ORAL_TABLET | Freq: Every day | ORAL | Status: DC
  Administered 2018-10-22 – 2018-10-24 (×3): 1 via ORAL
  Filled 2018-10-21 (×4): qty 1

## 2018-10-21 MED ORDER — HYDRALAZINE HCL 50 MG PO TABS
100.0000 mg | ORAL_TABLET | Freq: Three times a day (TID) | ORAL | Status: DC
  Administered 2018-10-21 – 2018-10-25 (×12): 100 mg via ORAL
  Filled 2018-10-21 (×14): qty 2

## 2018-10-21 MED ORDER — HYDROXYCHLOROQUINE SULFATE 200 MG PO TABS
200.0000 mg | ORAL_TABLET | Freq: Two times a day (BID) | ORAL | Status: DC
  Administered 2018-10-21 – 2018-10-25 (×8): 200 mg via ORAL
  Filled 2018-10-21 (×8): qty 1

## 2018-10-21 MED ORDER — SODIUM CHLORIDE 0.9% FLUSH
3.0000 mL | Freq: Two times a day (BID) | INTRAVENOUS | Status: DC
  Administered 2018-10-21 – 2018-10-24 (×6): 3 mL via INTRAVENOUS

## 2018-10-21 MED ORDER — ALLOPURINOL 100 MG PO TABS
100.0000 mg | ORAL_TABLET | Freq: Every day | ORAL | Status: DC
  Administered 2018-10-21 – 2018-10-24 (×4): 100 mg via ORAL
  Filled 2018-10-21 (×4): qty 1

## 2018-10-21 MED ORDER — SODIUM CHLORIDE 0.9% FLUSH: 3.0000 mL | INTRAVENOUS | Status: DC | PRN

## 2018-10-21 MED ORDER — HYDROMORPHONE HCL 1 MG/ML IJ SOLN
0.5000 mg | Freq: Once | INTRAMUSCULAR | Status: AC
  Administered 2018-10-21: 0.5 mg via INTRAVENOUS
  Filled 2018-10-21: qty 1

## 2018-10-21 MED ORDER — LEFLUNOMIDE 20 MG PO TABS
20.0000 mg | ORAL_TABLET | Freq: Every day | ORAL | Status: DC
  Administered 2018-10-21 – 2018-10-25 (×5): 20 mg via ORAL
  Filled 2018-10-21 (×5): qty 1

## 2018-10-21 MED ORDER — POTASSIUM CHLORIDE CRYS ER 20 MEQ PO TBCR
20.0000 meq | EXTENDED_RELEASE_TABLET | Freq: Two times a day (BID) | ORAL | Status: DC
  Administered 2018-10-21 – 2018-10-25 (×8): 20 meq via ORAL
  Filled 2018-10-21 (×8): qty 1

## 2018-10-21 MED ORDER — HYDROMORPHONE HCL 1 MG/ML IJ SOLN: 0.5000 mg | INTRAMUSCULAR | Status: DC | PRN

## 2018-10-21 MED ORDER — LORAZEPAM 2 MG/ML IJ SOLN
0.5000 mg | Freq: Once | INTRAMUSCULAR | Status: AC
  Administered 2018-10-21: 0.5 mg via INTRAVENOUS
  Filled 2018-10-21: qty 1

## 2018-10-21 MED ORDER — PREDNISONE 5 MG PO TABS
10.0000 mg | ORAL_TABLET | Freq: Every day | ORAL | Status: DC
  Administered 2018-10-22 – 2018-10-25 (×4): 10 mg via ORAL
  Filled 2018-10-21 (×4): qty 2

## 2018-10-21 MED ORDER — SODIUM CHLORIDE 0.9 % IV SOLN
250.0000 mL | INTRAVENOUS | Status: DC | PRN
  Filled 2018-10-21: qty 250

## 2018-10-21 MED ORDER — RIVAROXABAN 10 MG PO TABS
20.0000 mg | ORAL_TABLET | Freq: Every day | ORAL | Status: DC
  Administered 2018-10-21 – 2018-10-24 (×4): 20 mg via ORAL
  Filled 2018-10-21 (×4): qty 2

## 2018-10-21 MED ORDER — ENSURE ENLIVE PO LIQD
237.0000 mL | Freq: Two times a day (BID) | ORAL | Status: DC
  Administered 2018-10-22 – 2018-10-25 (×4): 237 mL via ORAL

## 2018-10-21 MED ORDER — METHYLPREDNISOLONE SODIUM SUCC 125 MG IJ SOLR
125.0000 mg | Freq: Once | INTRAMUSCULAR | Status: AC
  Administered 2018-10-21: 125 mg via INTRAVENOUS
  Filled 2018-10-21: qty 2

## 2018-10-21 MED ORDER — CARVEDILOL 6.25 MG PO TABS
6.2500 mg | ORAL_TABLET | Freq: Two times a day (BID) | ORAL | Status: DC
  Administered 2018-10-21 – 2018-10-25 (×8): 6.25 mg via ORAL
  Filled 2018-10-21 (×9): qty 1

## 2018-10-21 MED ORDER — SODIUM CHLORIDE 0.9 % IV BOLUS
500.0000 mL | Freq: Once | INTRAVENOUS | Status: AC
  Administered 2018-10-21: 500 mL via INTRAVENOUS

## 2018-10-21 MED ORDER — FUROSEMIDE 20 MG PO TABS
20.0000 mg | ORAL_TABLET | Freq: Every day | ORAL | Status: DC
  Administered 2018-10-21 – 2018-10-25 (×5): 20 mg via ORAL
  Filled 2018-10-21 (×5): qty 1

## 2018-10-21 NOTE — ED Notes (Signed)
ED Provider at bedside. 

## 2018-10-21 NOTE — Telephone Encounter (Signed)
Pt in the ED

## 2018-10-21 NOTE — ED Triage Notes (Signed)
Per [facility] pt from home for bilat hip and leg pain. Hx arthritis and fibromyalgia.

## 2018-10-21 NOTE — H&P (Signed)
History and Physical    [name] [name] INO:[id number] DOB: [birth year] DOA: 10/21/2018  PCP: Helane Rima, DO  Patient coming from: home   Chief Complaint: pain  HPI: [name] [name] is a 84 y.o. female with medical history significant for diet-controlled DM, CVA, pulmonary embolism 2019 on DOAC, dCHF, CKD3, rheumatoid arthritis on immune suppressants, chronic pain, who presents with above.  No recent trauma, pain began insidiously about 2 days ago. Mainly both hips and legs, radiating down legs. Mild low back pain as well. No focal weakness or numbness. Able to ambulate with a walker at baseline but wouldn't ambulate today 2/2 pain. No change in bowel/bladder function. No fevers, no midline back pain. No chest pain or sob. No abdominal pain, no vomiting or diarrhea.  Family has noticed decline in function since recent hospitalization (surgery for gallstone pancreatitis). Family does not think they can care for patient in her current conditioin  ED Course: labs, imaging, pain control.   Review of Systems: As per HPI otherwise 10 point review of systems negative.    Past Medical History:  Diagnosis Date  . Allergic rhinitis due to pollen 04/27/2007  . Arthritis    "in q joint" (08/07/2013)  . CHF (congestive heart failure) (HCC)   . Coronary atherosclerosis of native coronary artery   . Cough   . Disorder of bone and cartilage, unspecified   . Edema 05/03/2013  . Exertional shortness of breath    "sometimes" (08/07/2013)  . First degree atrioventricular block   . Gout, unspecified 04/19/2013  . Hyperlipidemia 04/27/2007  . Hypertension   . Midline low back pain without sciatica 06/27/2014  . Muscle spasm   . Muscle weakness (generalized)   . Myocardial infarction (HCC) ~ 1970  . Onychia and paronychia of toe   . Osteoarthrosis, unspecified whether generalized or localized, unspecified site 04/27/2007  . Other fall   . Other malaise and fatigue   . Pneumonia 05/2018  .  Prepatellar bursitis   . Pulmonary embolism (HCC) 07/2018  . Rheumatic nodule 06/28/2017  . Seizures (HCC)   . Stroke (HCC) 01/17/2014  . TIA (transient ischemic attack)    "a few one summer" (08/07/2013)  . Type II diabetes mellitus (HCC)    "fasting 90-110s" (08/07/2013)  . Unspecified essential hypertension 08/08/2013  . Unspecified vitamin D deficiency     Past Surgical History:  Procedure Laterality Date  . ABDOMINAL HYSTERECTOMY  04/1980  . APPENDECTOMY  1960  . CARDIAC CATHETERIZATION  2003  . CATARACT EXTRACTION W/ INTRAOCULAR LENS  IMPLANT, BILATERAL Bilateral ~ 2012  . CHOLECYSTECTOMY N/A 06/26/2018   Procedure: LAPAROSCOPIC CHOLECYSTECTOMY POSSIBLE INTRAOPERATIVE CHOLANGIOGRAM;  Surgeon: Abigail Miyamoto, MD;  Location: [facility] OR;  Service: General;  Laterality: N/A;  . JOINT REPLACEMENT    . REPLACEMENT TOTAL KNEE Right 09/2005  . RIGHT/LEFT HEART CATH AND CORONARY ANGIOGRAPHY N/A 06/01/2018   Procedure: RIGHT/LEFT HEART CATH AND CORONARY ANGIOGRAPHY;  Surgeon: Corky Crafts, MD;  Location: [facility] INVASIVE CV LAB;  Service: Cardiovascular;  Laterality: N/A;  . SHOULDER OPEN ROTATOR CUFF REPAIR Right 07/1999  . TONSILLECTOMY  08/1974  . TOTAL KNEE ARTHROPLASTY Left 08/07/2013   Procedure: TOTAL KNEE ARTHROPLASTY- LEFT;  Surgeon: Dannielle Huh, MD;  Location: [facility] OR;  Service: Orthopedics;  Laterality: Left;  . TRANSTHORACIC ECHOCARDIOGRAM  2003   EF 55-65%; mild concentric LVH     reports that she has never smoked. She has never used smokeless tobacco. She reports that she does not drink alcohol  or use drugs.  Allergies  Allergen Reactions  . Clonidine Derivatives Swelling    [name]  . Fish Allergy Diarrhea, Swelling and Other (See Comments)    Turns skin "black," but can tolerate white fish Salmon- Diarrhea  . Shellfish Allergy Hives  . Doxycycline Rash  . Indomethacin Other (See Comments)     Reaction not recalled by the patient  . Lyrica [Pregabalin] Other (See Comments)    Hallucinations   . Methyldopa Other (See Comments)    Aldomet (for hypertension): Reaction not recalled by the patient  . Morphine And Related Other (See Comments)    Family reports it drops her bp that she needs iv fluids  . Orange Fruit [Citrus] Other (See Comments)    Indigestion/heartburn  . Strawberry (Diagnostic) Itching  . Cetirizine Hcl Itching and Rash  . Codeine Itching  . Levaquin [Levofloxacin In D5w] Rash  . Tomato Rash    Family History  Problem Relation Age of Onset  . Heart attack Father     Prior to Admission medications   Medication Sig Start Date End Date Taking? Authorizing Provider  albuterol (PROVENTIL HFA;VENTOLIN HFA) 108 (90 Base) MCG/ACT inhaler Inhale 1-2 puffs into the lungs every 6 (six) hours as needed for wheezing or shortness of breath.    Yes [provider]  allopurinol (ZYLOPRIM) 100 MG tablet Take 100 mg by mouth at bedtime.    Yes [provider]  carvedilol (COREG) 6.25 MG tablet Take 1 tablet (6.25 mg total) by mouth 2 (two) times daily with a meal. 07/27/18  Yes Hongalgi, Anand D, MD  D3-1000 1000 units capsule Take 1,000 Units by mouth daily. 06/07/18  Yes [provider]  diclofenac sodium (VOLTAREN) 1 % GEL Apply 1 application topically daily. Patient taking differently: Apply 1 application topically 4 (four) times daily as needed (pain). Apply to painful areas of shoulders and arms daily as directed 02/22/18  Yes Helane Rima, DO  ENSURE (ENSURE) Take 237 mLs by mouth every evening.    Yes [provider]  furosemide (LASIX) 40 MG tablet Take 0.5 tablets (20 mg total) by mouth daily. 09/15/18  Yes Helane Rima, DO  gabapentin (NEURONTIN) 100 MG capsule Take 1 capsule (100 mg total) by mouth at bedtime. 05/19/18  Yes Helane Rima, DO  hydrALAZINE (APRESOLINE) 100 MG tablet Take 100 mg by mouth 3 (three) times daily.   08/05/18  Yes [provider]  hydroxychloroquine (PLAQUENIL) 200 MG tablet Take 200 mg by mouth 2 (two) times daily.   Yes [provider]  hydroxypropyl methylcellulose / hypromellose (ISOPTO TEARS / GONIOVISC) 2.5 % ophthalmic solution Place 1 drop into both eyes daily. 07/27/18  Yes Hongalgi, Maximino Greenland, MD  isosorbide mononitrate (IMDUR) 30 MG 24 hr tablet Take 1 tablet (30 mg total) by mouth at bedtime. 07/27/18  Yes Hongalgi, Maximino Greenland, MD  leflunomide (ARAVA) 20 MG tablet Take 20 mg by mouth daily.   Yes [provider]  Olopatadine HCl 0.2 % SOLN Place 1 drop into both eyes daily. 07/27/18  Yes Hongalgi, Maximino Greenland, MD  predniSONE (DELTASONE) 10 MG tablet TAKE 1 TABLET DAILY Patient taking differently: Take 10 mg by mouth daily with breakfast.  10/18/18  Yes Helane Rima, DO  rivaroxaban (XARELTO) 20 MG TABS tablet Take 20 mg by mouth daily with supper.   Yes [provider]  sacubitril-valsartan (ENTRESTO) 49-51 MG Take 1 tablet by mouth daily at 12  noon.    Yes [provider]  spironolactone (ALDACTONE) 25 MG tablet Take 12.5 mg by mouth daily.   Yes [provider]  traMADol (ULTRAM) 50 MG tablet Take 50 mg by mouth every 6 (six) hours as needed for moderate pain or severe pain.  08/25/18  Yes [provider]  triamcinolone cream (KENALOG) 0.1 % Apply 1 application topically 2 (two) times daily as needed (for itching- to affected sites).    Yes [provider]  vitamin C (ASCORBIC ACID) 500 MG tablet Take 500 mg by mouth daily.   Yes [provider]  potassium chloride SA (K-DUR,KLOR-CON) 20 MEQ tablet Take 1 tablet (20 mEq total) by mouth 2 (two) times daily. Patient not taking: Reported on 10/21/2018 07/27/18   Elease Etienne, MD    Physical Exam: Vitals:   10/21/18 1052 10/21/18 1219 10/21/18 1623 10/21/18 1712  BP: (!) 191/101 (!) 193/96 (!) 182/96 (!) 175/89  Pulse: 84 87 89 99  Resp: 18 18 19 18   Temp:       TempSrc:      SpO2: 99% 100% 99% 99%    Constitutional: No acute distress Head: Atraumatic Eyes: Conjunctiva clear ENM: Moist mucous membranes. Poor dentition.  Neck: Supple Respiratory: Clear to auscultation bilaterally save for faint rales at bases, no wheezing/rales/rhonchi. decreased respiratory effort. No accessory muscle use. . Cardiovascular: Regular rate and rhythm. Distant heart sounds, soft systolic murmur. Abdomen: Non-tender, non-distended. No masses. No rebound or guarding. Positive bowel sounds. Musculoskeletal: right knee effusion. Normal rom hips and knees. Neg faber/fader Skin: No rashes, lesions, or ulcers.  Extremities: trace peripheral edema in LEs right greater than left Neurologic: Alert, moving all 4 extremities. Distal sensation to light touch intact Psychiatric: Normal insight and judgement.   Labs on Admission: I have personally reviewed following labs and imaging studies  CBC: Recent Labs  Lab 10/21/18 0954  WBC 7.7  NEUTROABS 4.7  HGB 9.8*  HCT 31.4*  MCV 99.4  PLT 247   Basic Metabolic Panel: Recent Labs  Lab 10/21/18 0954  NA 136  K 3.3*  CL 104  CO2 22  GLUCOSE 96  BUN 18  CREATININE 1.01*  CALCIUM 8.4*   GFR: CrCl cannot be calculated (Unknown ideal weight.). Liver Function Tests: Recent Labs  Lab 10/21/18 0954  AST 20  ALT 11  ALKPHOS 41  BILITOT 1.0  PROT 5.9*  ALBUMIN 3.3*   No results for input(s): LIPASE, AMYLASE in the last 168 hours. No results for input(s): AMMONIA in the last 168 hours. Coagulation Profile: No results for input(s): INR, PROTIME in the last 168 hours. Cardiac Enzymes: No results for input(s): CKTOTAL, CKMB, CKMBINDEX, TROPONINI in the last 168 hours. BNP (last 3 results) No results for input(s): PROBNP in the last 8760 hours. HbA1C: No results for input(s): HGBA1C in the last 72 hours. CBG: No results for input(s): GLUCAP in the last 168 hours. Lipid Profile: No results for input(s):  CHOL, HDL, LDLCALC, TRIG, CHOLHDL, LDLDIRECT in the last 72 hours. Thyroid Function Tests: No results for input(s): TSH, T4TOTAL, FREET4, T3FREE, THYROIDAB in the last 72 hours. Anemia Panel: No results for input(s): VITAMINB12, FOLATE, FERRITIN, TIBC, IRON, RETICCTPCT in the last 72 hours. Urine analysis:    Component Value Date/Time   COLORURINE STRAW (A) 10/21/2018 1131   APPEARANCEUR CLEAR 10/21/2018 1131   LABSPEC 1.009 10/21/2018 1131   PHURINE 6.0 10/21/2018 1131   GLUCOSEU NEGATIVE 10/21/2018 1131   HGBUR NEGATIVE 10/21/2018 1131  BILIRUBINUR NEGATIVE 10/21/2018 1131   BILIRUBINUR Negative 01/13/2017 1234   KETONESUR NEGATIVE 10/21/2018 1131   PROTEINUR NEGATIVE 10/21/2018 1131   UROBILINOGEN 0.2 01/13/2017 1234   UROBILINOGEN 1.0 09/11/2015 0135   NITRITE NEGATIVE 10/21/2018 1131   LEUKOCYTESUR NEGATIVE 10/21/2018 1131    Radiological Exams on Admission: Dg Lumbar Spine Complete  Result Date: 10/21/2018 CLINICAL DATA:  Bilateral hip and leg pain EXAM: LUMBAR SPINE - COMPLETE 4+ VIEW COMPARISON:  Abdominal CT 09/11/2015 FINDINGS: Transitional S1 vertebra with rudimentary S1-2 disc space. Remote L1 superior endplate fracture based on prior. Height loss is mild. No acute fracture is seen. Advanced diffuse degenerative disc narrowing. Diffuse advanced facet arthropathy with spurring and anterolisthesis at L4-5 and L5-S1. There is milder retrolisthesis at L2-3. IMPRESSION: 1. No acute finding. 2. Remote L1 superior endplate fracture. 3. Advanced degenerative disease with multilevel listhesis. Electronically Signed   By: Marnee Spring [name]   On: 10/21/2018 10:30   Mr Lumbar Spine Wo Contrast  Result Date: 10/21/2018 CLINICAL DATA:  84 year old female with severe pain radiating to the hips and legs, worse on the right. EXAM: MRI LUMBAR SPINE WITHOUT CONTRAST TECHNIQUE: Multiplanar, multisequence MR imaging of the lumbar spine was performed. No intravenous contrast was  administered. COMPARISON:  Lumbar radiographs 1014 hours today. Abdomen MRI 06/23/2018, and earlier. FINDINGS: Segmentation: Nearly full size S1-S2 disc space, but otherwise normal segmentation with 5 lumbar type vertebral bodies and sacralized S1 level. Same numbering as on the plain films today. Alignment: Mild levoconvex lumbar scoliosis. Chronic grade 1 anterolisthesis at L4-L5 and L5-S1 is stable to mildly increased since 2016. Chronic mild retrolisthesis of L2 on L3 is stable. Vertebrae: No marrow edema or evidence of acute osseous abnormality. Chronic L1 compression fracture. Degenerative endplate signal changes in the lumbar spine. Intact visible sacrum and SI joints. Conus medullaris and cauda equina: Conus extends to the L1-L2 level. No lower spinal cord or conus signal abnormality. Paraspinal and other soft tissues: Stable visible abdomen. Negative paraspinal soft tissues aside from posterior subcutaneous edema. Disc levels: No lower thoracic spinal stenosis. T12-L1: Mild grade 1 anterolisthesis with mild to moderate facet and ligament flavum hypertrophy. Mild disc bulge. No spinal stenosis. Mild left and moderate right T12 foraminal stenosis. L1-L2:  Mild to moderate facet hypertrophy.  No stenosis. L2-L3: Chronic disc space loss and mild retrolisthesis. Circumferential disc bulge with mild to moderate facet and ligament flavum hypertrophy. Mild spinal stenosis. Mild left and moderate right L2 foraminal stenosis. L3-L4: Chronic disc space loss. Vacuum disc here in 2016. Circumferential but mostly far lateral disc bulging and endplate spurring. Moderate facet and ligament flavum hypertrophy. Mild spinal and right lateral recess stenosis (right L4 nerve level). Mild left and moderate right L3 foraminal stenosis. L4-L5: Chronic anterolisthesis, disc space loss and vacuum disc. Circumferential disc/pseudo disc with severe facet and ligament flavum hypertrophy. Severe spinal stenosis with left greater than  right lateral recess stenosis (L5 nerve levels). Moderate to severe bilateral L4 foraminal stenosis. L5-S1: Chronic anterolisthesis with disc space loss and vacuum disc. Circumferential disc/pseudo disc. Severe facet and ligament flavum hypertrophy. Severe spinal and bilateral lateral recess stenosis (S1 nerve levels). Moderate left and moderate to severe right L5 foraminal stenosis. S1-S2: Full size disc space, otherwise negative with no stenosis. IMPRESSION: 1. No acute osseous abnormality. Chronic lumbar spondylolisthesis. Chronic L1 compression fracture. 2. Severe multifactorial spinal stenosis at L4-L5 and L5-S1, with severe lateral recess stenosis worse at the latter. Moderate to severe bilateral L5 foraminal stenosis at both  levels. 3. Mild spinal stenosis at L2-L3 and L3-L4 with up to moderate right neural foraminal stenosis. Electronically Signed   By: Odessa Fleming [name]   On: 10/21/2018 17:32   Dg Hip Unilat W Or Wo Pelvis 2-3 Views Right  Result Date: 10/21/2018 CLINICAL DATA:  BILATERAL hip and leg pain EXAM: DG HIP (WITH OR WITHOUT PELVIS) 2-3V RIGHT COMPARISON:  Pelvic radiograph 01/24/2015, CT abdomen and pelvis 06/12/2014 FINDINGS: [place] mineralization. Narrowing of hip joints bilaterally. SI joints preserved. No acute fracture or dislocation. Bubbly well-circumscribed lytic lesion at the proximal LEFT femoral diaphysis lesion again identified, 4.7 x 2.0 cm, present and not significantly changed since at least 01/24/2015 and present on the prior CT exam from 2015 as well, generally benign in character. Scattered atherosclerotic calcifications. IMPRESSION: Degenerative changes of the hip joints bilaterally. No acute bony abnormalities. Chronic benign-appearing lytic lesion at proximal LEFT femoral diaphysis not significantly since 01/24/2015. Electronically Signed   By: Ulyses Southward [name]   On: 10/21/2018 10:37    Assessment/Plan Principal Problem:   Acute pain Active Problems:   Type 2 diabetes  mellitus with hyperlipidemia (HCC), patient declines statin   Hypertension associated with diabetes (HCC)   Rheumatoid arthritis involving multiple sites (HCC)   Anemia of chronic disease   CKD stage 3 due to type 2 diabetes mellitus (HCC)   High risk medications, long-term use   Hypokalemia   Pulmonary embolism (HCC), 07/2018, on Xarelto with goal to DC after 6 months if more active   # Acute pain - etiology unclear, possibly 2/2 RA but family says this is not her typical RA pain. Reassuringly imaging (x-rays of lumbar spine and pelvis, and MRI of lumbar spine) negative for acute process, though chronic degenerative disease is seen which very well could be causing her radiculopathic symptoms. No s/s cauda equina or myelopathy. Pain much better controlled after IV dialudid in ED. Admitted for pain control and inability to care for in home. - continue home tramadol and gabapentin - dilaudid prn - will hold on continuing pulse-dose steroids that given in ED, as my understanding is that they are of limited benefit in RA - continue RA meds as below - monitor for s/s spinal cord involvement - pt/ot ordered  # Rheumatoid arthritis - w/ possible flare - cont home plaquenil, leflunomide, prednisone  # PE - this year, in setting of multiple hospitalizations - cont home rivaroxaban  # dCHF - cont home lasix  # HTN - here bp mod/severe elevation, asymptomatic. Hasn't had home meds yet - cont home lasix, hydralazine, imdur, spironolactone, carvedilol, entresto - ctm  # gout - cont home allopurinol  # glaucoma - home olopatadine   DVT prophylaxis: anticoagualted Code Status: full  Family Communication: daughter Galen Daft [phone]  Disposition Plan: tbd  Consults called: none  Admission status: obs    Silvano Bilis MD [facility] Pager [phone]  If 7PM-7AM, please contact night-coverage [email or web address] Password [facility]  10/21/2018, 7:36 PM

## 2018-10-21 NOTE — Telephone Encounter (Signed)
Copied from CRM [id number]. Topic: General - Inquiry >> Oct 21, 2018  1:57 PM Terisa Starr wrote: Reason for CRM: [name], Para March called and said that patient went to ED this morning as advised by office and it is 2pm and the doctor is just now walking in. She is highly upset because she has not had her medications all day. She was very upset and wanted Dr Earlene Plater to be aware

## 2018-10-21 NOTE — Telephone Encounter (Signed)
Caller states her mother is having severe pain in her legs and hips- worse on the right side. Her BP os 188/99, pulse 87 BP is increased due to pain. No Fever. She fells cold- using covers and increasing thermostat . Has taken Tylenol and tramadol and it is not helping . PER [facility]  Patient went to the ED

## 2018-10-21 NOTE — ED Provider Notes (Signed)
[facility]-EMERGENCY DEPT Provider Note   CSN: [id number] Arrival date & time: 10/21/18  0813     History   Chief Complaint Chief Complaint  Patient presents with  . Hip Pain  . Leg Pain    HPI [name] [name] is a 84 y.o. female.  Patient presented with pain in her lower back and down both legs.  Seems to be worse in the right side.  This is been going on for couple days  The history is provided by the patient. No language interpreter was used.  Hip Pain  This is a new problem. The current episode started 12 to 24 hours ago. The problem occurs constantly. The problem has not changed since onset.Pertinent negatives include no chest pain, no abdominal pain and no headaches. Exacerbated by: Movement. Nothing relieves the symptoms. She has tried nothing for the symptoms.    Past Medical History:  Diagnosis Date  . Allergic rhinitis due to pollen 04/27/2007  . Arthritis    "in q joint" (08/07/2013)  . CHF (congestive heart failure) (HCC)   . Coronary atherosclerosis of native coronary artery   . Cough   . Disorder of bone and cartilage, unspecified   . Edema 05/03/2013  . Exertional shortness of breath    "sometimes" (08/07/2013)  . First degree atrioventricular block   . Gout, unspecified 04/19/2013  . Hyperlipidemia 04/27/2007  . Hypertension   . Midline low back pain without sciatica 06/27/2014  . Muscle spasm   . Muscle weakness (generalized)   . Myocardial infarction (HCC) ~ 1970  . Onychia and paronychia of toe   . Osteoarthrosis, unspecified whether generalized or localized, unspecified site 04/27/2007  . Other fall   . Other malaise and fatigue   . Pneumonia 05/2018  . Prepatellar bursitis   . Pulmonary embolism (HCC) 07/2018  . Rheumatic nodule 06/28/2017  . Seizures (HCC)   . Stroke (HCC) 01/17/2014  . TIA (transient ischemic attack)    "a few one summer" (08/07/2013)  . Type II diabetes mellitus (HCC)    "fasting 90-110s"  (08/07/2013)  . Unspecified essential hypertension 08/08/2013  . Unspecified vitamin D deficiency     Patient Active Problem List   Diagnosis Date Noted  . Physical deconditioning 09/06/2018  . Hyperlipidemia associated with type 2 diabetes mellitus (HCC), refuses statin 09/06/2018  . Refusal of statin medication by patient 09/04/2018  . Deep vein thrombosis (DVT) of distal vein of right lower extremity (HCC) 07/30/2018  . Hypertensive heart disease with heart failure (HCC) 07/30/2018  . Pulmonary embolism (HCC), 07/2018, on Xarelto with goal to DC after 6 months if more active 07/10/2018  . DCM (dilated cardiomyopathy) (HCC)   . Benign hypertensive heart and kidney disease with CHF and stage 3 chronic kidney disease (HCC) 05/28/2018  . Osteoarthritis 05/06/2018  . Adhesive capsulitis of right shoulder 10/12/2017  . Idiopathic chronic gout of multiple sites with tophus 08/25/2017  . History of total knee arthroplasty, bilateral 02/18/2017  . History of rotator cuff surgery 02/18/2017  . High risk medications, long-term use 09/21/2016  . Chronic combined systolic and diastolic CHF (congestive heart failure) (HCC) 09/12/2015  . Bilateral edema of lower extremity 06/05/2014  . CKD stage 3 due to type 2 diabetes mellitus (HCC) 06/05/2014  . DM (diabetes mellitus), type 2 with renal complications (HCC) 06/05/2014    Class: Chronic  . Anemia of chronic disease 05/01/2014  . Rheumatoid arthritis involving multiple sites (HCC) 12/19/2013    Class: Chronic  .  CAD (coronary artery disease) 10/18/2013  . Hypertension associated with diabetes (HCC) 08/08/2013    Class: Chronic  . Fibromyalgia 05/10/2013  . Type 2 diabetes mellitus with hyperlipidemia (HCC), patient declines statin 04/27/2007  . Allergic rhinitis 04/27/2007  . Diverticulosis 04/27/2007    Past Surgical History:  Procedure Laterality Date  . ABDOMINAL HYSTERECTOMY  04/1980  . APPENDECTOMY  1960  . CARDIAC CATHETERIZATION   2003  . CATARACT EXTRACTION W/ INTRAOCULAR LENS  IMPLANT, BILATERAL Bilateral ~ 2012  . CHOLECYSTECTOMY N/A 06/26/2018   Procedure: LAPAROSCOPIC CHOLECYSTECTOMY POSSIBLE INTRAOPERATIVE CHOLANGIOGRAM;  Surgeon: Abigail Miyamoto, MD;  Location: [facility] OR;  Service: General;  Laterality: N/A;  . JOINT REPLACEMENT    . REPLACEMENT TOTAL KNEE Right 09/2005  . RIGHT/LEFT HEART CATH AND CORONARY ANGIOGRAPHY N/A 06/01/2018   Procedure: RIGHT/LEFT HEART CATH AND CORONARY ANGIOGRAPHY;  Surgeon: Corky Crafts, MD;  Location: [facility] INVASIVE CV LAB;  Service: Cardiovascular;  Laterality: N/A;  . SHOULDER OPEN ROTATOR CUFF REPAIR Right 07/1999  . TONSILLECTOMY  08/1974  . TOTAL KNEE ARTHROPLASTY Left 08/07/2013   Procedure: TOTAL KNEE ARTHROPLASTY- LEFT;  Surgeon: Dannielle Huh, MD;  Location: [facility] OR;  Service: Orthopedics;  Laterality: Left;  . TRANSTHORACIC ECHOCARDIOGRAM  2003   EF 55-65%; mild concentric LVH     OB History   No obstetric history on file.      Home Medications    Prior to Admission medications   Medication Sig Start Date End Date Taking? Authorizing Provider  albuterol (PROVENTIL HFA;VENTOLIN HFA) 108 (90 Base) MCG/ACT inhaler Inhale 1-2 puffs into the lungs every 6 (six) hours as needed for wheezing or shortness of breath.    Yes [provider]  allopurinol (ZYLOPRIM) 100 MG tablet Take 100 mg by mouth at bedtime.    Yes [provider]  carvedilol (COREG) 6.25 MG tablet Take 1 tablet (6.25 mg total) by mouth 2 (two) times daily with a meal. 07/27/18  Yes Hongalgi, Anand D, MD  D3-1000 1000 units capsule Take 1,000 Units by mouth daily. 06/07/18  Yes [provider]  diclofenac sodium (VOLTAREN) 1 % GEL Apply 1 application topically daily. Patient taking differently: Apply 1 application topically 4 (four) times daily as needed (pain). Apply to painful areas of shoulders and arms daily as directed 02/22/18  Yes Helane Rima, DO  ENSURE (ENSURE) Take 237 mLs  by mouth every evening.    Yes [provider]  furosemide (LASIX) 40 MG tablet Take 0.5 tablets (20 mg total) by mouth daily. 09/15/18  Yes Helane Rima, DO  gabapentin (NEURONTIN) 100 MG capsule Take 1 capsule (100 mg total) by mouth at bedtime. 05/19/18  Yes Helane Rima, DO  hydrALAZINE (APRESOLINE) 100 MG tablet Take 100 mg by mouth 3 (three) times daily.  08/05/18  Yes [provider]  hydroxychloroquine (PLAQUENIL) 200 MG tablet Take 200 mg by mouth 2 (two) times daily.   Yes [provider]  hydroxypropyl methylcellulose / hypromellose (ISOPTO TEARS / GONIOVISC) 2.5 % ophthalmic solution Place 1 drop into both eyes daily. 07/27/18  Yes Hongalgi, Maximino Greenland, MD  isosorbide mononitrate (IMDUR) 30 MG 24 hr tablet Take 1 tablet (30 mg total) by mouth at bedtime. 07/27/18  Yes Hongalgi, Maximino Greenland, MD  leflunomide (ARAVA) 20 MG tablet Take 20 mg by mouth daily.   Yes [provider]  Olopatadine HCl 0.2 % SOLN Place 1 drop into both eyes daily. 07/27/18  Yes Hongalgi, Maximino Greenland, MD  predniSONE (DELTASONE) 10 MG  tablet TAKE 1 TABLET DAILY Patient taking differently: Take 10 mg by mouth daily with breakfast.  10/18/18  Yes Helane Rima, DO  rivaroxaban (XARELTO) 20 MG TABS tablet Take 20 mg by mouth daily with supper.   Yes [provider]  sacubitril-valsartan (ENTRESTO) 49-51 MG Take 1 tablet by mouth daily at 12 noon.    Yes [provider]  spironolactone (ALDACTONE) 25 MG tablet Take 12.5 mg by mouth daily.   Yes [provider]  traMADol (ULTRAM) 50 MG tablet Take 50 mg by mouth every 6 (six) hours as needed for moderate pain or severe pain.  08/25/18  Yes [provider]  triamcinolone cream (KENALOG) 0.1 % Apply 1 application topically 2 (two) times daily as needed (for itching- to affected sites).    Yes [provider]  vitamin C (ASCORBIC ACID) 500 MG tablet Take 500 mg by mouth daily.   Yes [provider]    potassium chloride SA (K-DUR,KLOR-CON) 20 MEQ tablet Take 1 tablet (20 mEq total) by mouth 2 (two) times daily. Patient not taking: Reported on 10/21/2018 07/27/18   Elease Etienne, MD    Family History Family History  Problem Relation Age of Onset  . Heart attack Father     Social History Social History   Tobacco Use  . Smoking status: Never Smoker  . Smokeless tobacco: Never Used  Substance Use Topics  . Alcohol use: No    Alcohol/week: 0.0 standard drinks  . Drug use: No     Allergies   Clonidine derivatives; Fish allergy; Shellfish allergy; Doxycycline; Indomethacin; Lyrica [pregabalin]; Methyldopa; Morphine and related; Orange fruit [citrus]; Strawberry (diagnostic); Cetirizine hcl; Codeine; Levaquin [levofloxacin in d5w]; and Tomato   Review of Systems Review of Systems  Constitutional: Negative for appetite change and fatigue.  HENT: Negative for congestion, ear discharge and sinus pressure.   Eyes: Negative for discharge.  Respiratory: Negative for cough.   Cardiovascular: Negative for chest pain.  Gastrointestinal: Negative for abdominal pain and diarrhea.  Genitourinary: Negative for frequency and hematuria.  Musculoskeletal: Positive for back pain.       Pain in both lower legs  Skin: Negative for rash.  Neurological: Negative for seizures and headaches.  Psychiatric/Behavioral: Negative for hallucinations.     Physical Exam Updated Vital Signs BP (!) 193/96 (BP Location: Right Arm)   Pulse 87   Temp 98.2 F (36.8 C) (Oral)   Resp 18   SpO2 100%   Physical Exam Constitutional:      Appearance: She is well-developed.  HENT:     Head: Normocephalic.     Mouth/Throat:     Mouth: Mucous membranes are moist.  Eyes:     General: No scleral icterus.    Conjunctiva/sclera: Conjunctivae normal.  Neck:     Musculoskeletal: Neck supple.     Thyroid: No thyromegaly.  Cardiovascular:     Rate and Rhythm: Normal rate and regular rhythm.     Heart  sounds: No murmur. No friction rub. No gallop.   Pulmonary:     Breath sounds: No stridor. No wheezing or rales.  Chest:     Chest wall: No tenderness.  Abdominal:     General: There is no distension.     Tenderness: There is no abdominal tenderness. There is no rebound.  Musculoskeletal: Normal range of motion.        General: No swelling.     Right lower leg: No edema.     Comments: Tenderness  to lumbar spine.  Lymphadenopathy:     Cervical: No cervical adenopathy.  Skin:    Findings: No erythema or rash.  Neurological:     Mental Status: She is alert and oriented to person, place, and time.     Motor: No weakness or abnormal muscle tone.     Coordination: Coordination normal.     Comments: Patient able to lift legs off the table without significant pain  Psychiatric:        Behavior: Behavior normal.      ED Treatments / Results  Labs (all labs ordered are listed, but only abnormal results are displayed) Labs Reviewed  CBC WITH DIFFERENTIAL/PLATELET - Abnormal; Notable for the following components:      Result Value   RBC 3.16 (*)    Hemoglobin 9.8 (*)    HCT 31.4 (*)    RDW 15.9 (*)    All other components within normal limits  COMPREHENSIVE METABOLIC PANEL - Abnormal; Notable for the following components:   Potassium 3.3 (*)    Creatinine, Ser 1.01 (*)    Calcium 8.4 (*)    Total Protein 5.9 (*)    Albumin 3.3 (*)    GFR calc non Af Amer 50 (*)    GFR calc Af Amer 58 (*)    All other components within normal limits  URINALYSIS, ROUTINE W REFLEX MICROSCOPIC - Abnormal; Notable for the following components:   Color, Urine STRAW (*)    All other components within normal limits    EKG None  Radiology Dg Lumbar Spine Complete  Result Date: 10/21/2018 CLINICAL DATA:  Bilateral hip and leg pain EXAM: LUMBAR SPINE - COMPLETE 4+ VIEW COMPARISON:  Abdominal CT 09/11/2015 FINDINGS: Transitional S1 vertebra with rudimentary S1-2 disc space. Remote L1 superior  endplate fracture based on prior. Height loss is mild. No acute fracture is seen. Advanced diffuse degenerative disc narrowing. Diffuse advanced facet arthropathy with spurring and anterolisthesis at L4-5 and L5-S1. There is milder retrolisthesis at L2-3. IMPRESSION: 1. No acute finding. 2. Remote L1 superior endplate fracture. 3. Advanced degenerative disease with multilevel listhesis. Electronically Signed   By: Marnee Spring [name]   On: 10/21/2018 10:30   Dg Hip Unilat W Or Wo Pelvis 2-3 Views Right  Result Date: 10/21/2018 CLINICAL DATA:  BILATERAL hip and leg pain EXAM: DG HIP (WITH OR WITHOUT PELVIS) 2-3V RIGHT COMPARISON:  Pelvic radiograph 01/24/2015, CT abdomen and pelvis 06/12/2014 FINDINGS: [place] mineralization. Narrowing of hip joints bilaterally. SI joints preserved. No acute fracture or dislocation. Bubbly well-circumscribed lytic lesion at the proximal LEFT femoral diaphysis lesion again identified, 4.7 x 2.0 cm, present and not significantly changed since at least 01/24/2015 and present on the prior CT exam from 2015 as well, generally benign in character. Scattered atherosclerotic calcifications. IMPRESSION: Degenerative changes of the hip joints bilaterally. No acute bony abnormalities. Chronic benign-appearing lytic lesion at proximal LEFT femoral diaphysis not significantly since 01/24/2015. Electronically Signed   By: Ulyses Southward [name]   On: 10/21/2018 10:37    Procedures Procedures (including critical care time)  Medications Ordered in ED Medications  hydrALAZINE (APRESOLINE) tablet 100 mg (100 mg Oral Given 10/21/18 1445)  HYDROmorphone (DILAUDID) injection 0.5 mg (0.5 mg Intravenous Given 10/21/18 0953)  ondansetron (ZOFRAN) injection 4 mg (4 mg Intravenous Given 10/21/18 0953)  sodium chloride 0.9 % bolus 500 mL (0 mLs Intravenous Stopped 10/21/18 1217)     Initial Impression / Assessment and Plan / ED Course  I have reviewed  the triage vital signs and the nursing  notes.  Pertinent labs & imaging results that were available during my care of the patient were reviewed by me and considered in my medical decision making (see chart for details).     Pain with lower back pain.  Labs unremarkable and x-rays show arthritis.  When I went back into evaluate the patient she was with her medical power of attorney who I assume is her daughter.  Her daughter was very unhappy with the care and said she had not seen a provider or gotten any medicine.  I told the daughter that I saw her earlier this morning and we had ordered some pain medicine for her and when I checked she did receive the pain medicine.  The daughter stated that I was not listening to her.  At that time I was listening to her but also trying to examine the patient.  She stated she was unhappy with the care the patient has received.  She told me that she used to work from [employer].  I asked her what kind of work she did and she became more angry and stated I was being condescending to her.  It was obvious that was not going to be able to continue examining the patient.  So I left the room.  I told the charge nurse that I would be glad to take care of the patient if the daughter would agree to leave the room while I am examining the patient and then I will talk with the daughter.  I also stated to the daughter does not want me to take care of her mother anymore that another provider will be coming in in an hour and could continue the care.  The daughter did not want me to care for her mother anymore so a new provider saw the patient at a later time today Final Clinical Impressions(s) / ED Diagnoses   Final diagnoses:  None    ED Discharge Orders    None       Bethann Berkshire, MD 10/21/18 [phone]

## 2018-10-21 NOTE — ED Notes (Signed)
Patient transported to MRI 

## 2018-10-21 NOTE — ED Notes (Signed)
Patient visitor out at nurses station demanding to speak with "higher up personnel". Upset "with doctor about care". Patient visitor is reporting that "dr is not listening to my concerns". Charge nurse Misty Stanley notified, AD Alaina notified, and [facility] Tammy notified.

## 2018-10-21 NOTE — ED Notes (Signed)
AC Tammy at bedside

## 2018-10-21 NOTE — Progress Notes (Deleted)
[name] [name] is a 84 y.o. female is here for follow up.  History of Present Illness:   {CMA SCRIBE ATTESTATION}  HPI:   Health Maintenance Due  Topic Date Due  . TETANUS/TDAP  04/14/2015  . OPHTHALMOLOGY EXAM  09/12/2015  . PNA vac Low Risk Adult (2 of 2 - PCV13) 09/11/2016   Depression screen PHQ 2/9 06/28/2017 03/29/2017  Decreased Interest 0 0  Down, Depressed, Hopeless 0 0  PHQ - 2 Score 0 0  Some recent data might be hidden   PMHx, SurgHx, SocialHx, FamHx, Medications, and Allergies were reviewed in the Visit Navigator and updated as appropriate.   Patient Active Problem List   Diagnosis Date Noted  . Physical deconditioning 09/06/2018  . Hyperlipidemia associated with type 2 diabetes mellitus (HCC), refuses statin 09/06/2018  . Refusal of statin medication by patient 09/04/2018  . Deep vein thrombosis (DVT) of distal vein of right lower extremity (HCC) 07/30/2018  . Hypertensive heart disease with heart failure (HCC) 07/30/2018  . Pulmonary embolism (HCC), 07/2018, on Xarelto with goal to DC after 6 months if more active 07/10/2018  . DCM (dilated cardiomyopathy) (HCC)   . Benign hypertensive heart and kidney disease with CHF and stage 3 chronic kidney disease (HCC) 05/28/2018  . Osteoarthritis 05/06/2018  . Adhesive capsulitis of right shoulder 10/12/2017  . Idiopathic chronic gout of multiple sites with tophus 08/25/2017  . History of total knee arthroplasty, bilateral 02/18/2017  . History of rotator cuff surgery 02/18/2017  . High risk medications, long-term use 09/21/2016  . Chronic combined systolic and diastolic CHF (congestive heart failure) (HCC) 09/12/2015  . Bilateral edema of lower extremity 06/05/2014  . CKD stage 3 due to type 2 diabetes mellitus (HCC) 06/05/2014  . DM (diabetes mellitus), type 2 with renal complications (HCC) 06/05/2014    Class: Chronic  . Anemia of chronic disease 05/01/2014  . Rheumatoid arthritis involving multiple sites (HCC)  12/19/2013    Class: Chronic  . CAD (coronary artery disease) 10/18/2013  . Hypertension associated with diabetes (HCC) 08/08/2013    Class: Chronic  . Fibromyalgia 05/10/2013  . Type 2 diabetes mellitus with hyperlipidemia (HCC), patient declines statin 04/27/2007  . Allergic rhinitis 04/27/2007  . Diverticulosis 04/27/2007   Social History   Tobacco Use  . Smoking status: Never Smoker  . Smokeless tobacco: Never Used  Substance Use Topics  . Alcohol use: No    Alcohol/week: 0.0 standard drinks  . Drug use: No   Current Medications and Allergies:   Current Outpatient Medications:  .  albuterol (PROVENTIL HFA;VENTOLIN HFA) 108 (90 Base) MCG/ACT inhaler, Inhale 1-2 puffs into the lungs every 6 (six) hours as needed for wheezing or shortness of breath. , Disp: , Rfl:  .  allopurinol (ZYLOPRIM) 100 MG tablet, Take 100 mg by mouth at bedtime. , Disp: , Rfl:  .  carvedilol (COREG) 6.25 MG tablet, Take 1 tablet (6.25 mg total) by mouth 2 (two) times daily with a meal., Disp: , Rfl:  .  D3-1000 1000 units capsule, Take 1,000 Units by mouth daily., Disp: , Rfl: 0 .  diclofenac sodium (VOLTAREN) 1 % GEL, Apply 1 application topically daily. (Patient taking differently: Apply 1 application topically 4 (four) times daily as needed (pain). Apply to painful areas of shoulders and arms daily as directed), Disp: 100 g, Rfl: 4 .  ENSURE (ENSURE), Take 237 mLs by mouth every evening. , Disp: , Rfl:  .  furosemide (LASIX) 40 MG tablet, Take  0.5 tablets (20 mg total) by mouth daily., Disp: 30 tablet, Rfl: 0 .  gabapentin (NEURONTIN) 100 MG capsule, Take 1 capsule (100 mg total) by mouth at bedtime., Disp: 30 capsule, Rfl: 3 .  hydrALAZINE (APRESOLINE) 100 MG tablet, Take 1 tablet by mouth 3 (three) times daily., Disp: , Rfl:  .  hydroxychloroquine (PLAQUENIL) 200 MG tablet, Take 200 mg by mouth 2 (two) times daily., Disp: , Rfl:  .  hydroxypropyl methylcellulose / hypromellose (ISOPTO TEARS /  GONIOVISC) 2.5 % ophthalmic solution, Place 1 drop into both eyes daily., Disp: , Rfl:  .  isosorbide mononitrate (IMDUR) 30 MG 24 hr tablet, Take 1 tablet (30 mg total) by mouth at bedtime., Disp: , Rfl:  .  leflunomide (ARAVA) 20 MG tablet, Take 20 mg by mouth daily., Disp: , Rfl:  .  Olopatadine HCl 0.2 % SOLN, Place 1 drop into both eyes daily., Disp: , Rfl:  .  potassium chloride SA (K-DUR,KLOR-CON) 20 MEQ tablet, Take 1 tablet (20 mEq total) by mouth 2 (two) times daily., Disp: , Rfl:  .  predniSONE (DELTASONE) 10 MG tablet, TAKE 1 TABLET DAILY, Disp: 30 tablet, Rfl: 0 .  rivaroxaban (XARELTO) 20 MG TABS tablet, Take 20 mg by mouth daily with supper., Disp: , Rfl:  .  sacubitril-valsartan (ENTRESTO) 49-51 MG, Take 1 tablet by mouth daily at 12 noon. , Disp: , Rfl:  .  spironolactone (ALDACTONE) 25 MG tablet, Take 12.5 mg by mouth daily., Disp: , Rfl:  .  traMADol (ULTRAM) 50 MG tablet, Take 1 tablet by mouth every 6 (six) hours as needed for moderate pain or severe pain. , Disp: , Rfl: 0 .  triamcinolone cream (KENALOG) 0.1 %, Apply 1 application topically 2 (two) times daily as needed (for itching- to affected sites). , Disp: , Rfl:  .  vitamin C (ASCORBIC ACID) 500 MG tablet, Take 500 mg by mouth daily., Disp: , Rfl:   Allergies  Allergen Reactions  . Clonidine Derivatives Swelling    [name]  . Fish Allergy Diarrhea, Swelling and Other (See Comments)    Turns skin "black," but can tolerate white fish Salmon- Diarrhea  . Shellfish Allergy Hives  . Doxycycline Rash  . Indomethacin Other (See Comments)    Reaction not recalled by the patient  . Lyrica [Pregabalin] Other (See Comments)    Hallucinations   . Methyldopa Other (See Comments)    Aldomet (for hypertension): Reaction not recalled by the patient  . Morphine And Related Other (See Comments)    Family reports it drops her bp that she needs iv fluids    . Orange Fruit [Citrus] Other (See Comments)    Indigestion/heartburn  . Strawberry (Diagnostic) Itching  . Cetirizine Hcl Itching and Rash  . Codeine Itching  . Levaquin [Levofloxacin In D5w] Rash  . Tomato Rash   Review of Systems   Pertinent items are noted in the HPI. Otherwise, a complete ROS is negative.  Vitals:  There were no vitals filed for this visit.   There is no height or weight on file to calculate BMI.  Physical Exam:   Physical Exam  Results for orders placed or performed in visit on 09/14/18  CBC with Differential/Platelet  Result Value Ref Range   WBC 9.1 4.0 - 10.5 K/uL   RBC 3.79 (L) 3.87 - 5.11 Mil/uL   Hemoglobin 11.6 (L) 12.0 - 15.0 g/dL   HCT 95.2 (L) 84.1 -  46.0 %   MCV 93.0 78.0 - 100.0 fl   MCHC 32.8 30.0 - 36.0 g/dL   RDW 29.9 (H) 24.2 - 68.3 %   Platelets 264.0 150.0 - 400.0 K/uL   Neutrophils Relative % 81.5 (H) 43.0 - 77.0 %   Lymphocytes Relative 12.3 12.0 - 46.0 %   Monocytes Relative 4.5 3.0 - 12.0 %   Eosinophils Relative 0.7 0.0 - 5.0 %   Basophils Relative 1.0 0.0 - 3.0 %   Neutro Abs 7.4 1.4 - 7.7 K/uL   Lymphs Abs 1.1 0.7 - 4.0 K/uL   Monocytes Absolute 0.4 0.1 - 1.0 K/uL   Eosinophils Absolute 0.1 0.0 - 0.7 K/uL   Basophils Absolute 0.1 0.0 - 0.1 K/uL  Basic metabolic panel  Result Value Ref Range   Sodium 136 135 - 145 mEq/L   Potassium 4.0 3.5 - 5.1 mEq/L   Chloride 102 96 - 112 mEq/L   CO2 24 19 - 32 mEq/L   Glucose, Bld 124 (H) 70 - 99 mg/dL   BUN 23 6 - 23 mg/dL   Creatinine, Ser 4.19 (H) 0.40 - 1.20 mg/dL   Calcium 9.9 8.4 - 62.2 mg/dL   GFR 29.79 (L) >89.21 mL/min  B12  Result Value Ref Range   Vitamin B-12 441 211 - 911 pg/mL    Assessment and Plan:   There are no diagnoses linked to this encounter.  . Orders and follow up as documented in EpicCare, reviewed diet, exercise and weight control, cardiovascular risk and specific lipid/LDL goals reviewed, reviewed medications and side effects in detail.   . Reviewed expectations re: course of current medical issues. . Outlined signs and symptoms indicating need for more acute intervention. . Patient verbalized understanding and all questions were answered. . Patient received an After Visit Summary.  *** CMA served as Neurosurgeon during this visit. History, Physical, and Plan performed by medical provider. The above documentation has been reviewed and is accurate and complete. Helane Rima, D.O.  Helane Rima, DO [place] 10/21/2018

## 2018-10-21 NOTE — Telephone Encounter (Signed)
Added to open message.  

## 2018-10-21 NOTE — ED Provider Notes (Signed)
Assumed care from Dr. Verneda Skill.  Patient presented initially with lower back pain rating to her legs with some associated paresthesias.  No bowel or bladder dysfunction.  No red flags for cauda equina.  [name] baseline that she walks with a walker.  Was medicated by Dr. Roderic Palau here but continues to still note discomfort.  Work-up here including urinalysis, plain x-rays, CBC and C met without significant findings.  Will order MRI of LS spine as well as re-medicate for pain along with given steroids.  Suspect etiology is rheumatoid arthritis versus exacerbation of her fibromyalgia.  Will reassess  6:22 PM Patient is MRI shows evidence of spinal stenosis at L4/L5 and L5/S1.  Patient be medicated for pain but continues to be uncomfortable.  Concern extremities could be rheumatoid arthritis as well.  Will consult medicine for admission   Lacretia Leigh, MD 10/21/18 [phone]

## 2018-10-21 NOTE — Telephone Encounter (Signed)
Caller states her mother is having severe pain in her legs and hips- worse on the right side. Her BP os 188/99, pulse 87 BP is increased due to pain. No Fever. She fells cold- using covers and increasing thermostat . Has taken Tylenol and tramadol and it is not helping . PER [facility]  °Patient went to the ED °

## 2018-10-22 DIAGNOSIS — M5136 Other intervertebral disc degeneration, lumbar region: Secondary | ICD-10-CM | POA: Diagnosis not present

## 2018-10-22 DIAGNOSIS — R52 Pain, unspecified: Secondary | ICD-10-CM | POA: Diagnosis not present

## 2018-10-22 LAB — CBC
HCT: 33.9 % — ABNORMAL LOW (ref 36.0–46.0)
Hemoglobin: 10.6 g/dL — ABNORMAL LOW (ref 12.0–15.0)
MCH: 31.3 pg (ref 26.0–34.0)
MCHC: 31.3 g/dL (ref 30.0–36.0)
MCV: 100 fL (ref 80.0–100.0)
Platelets: 256 10*3/uL (ref 150–400)
RBC: 3.39 MIL/uL — ABNORMAL LOW (ref 3.87–5.11)
RDW: 15.8 % — ABNORMAL HIGH (ref 11.5–15.5)
WBC: 6 10*3/uL (ref 4.0–10.5)
nRBC: 0 % (ref 0.0–0.2)

## 2018-10-22 LAB — BASIC METABOLIC PANEL
Anion gap: 9 (ref 5–15)
BUN: 17 mg/dL (ref 8–23)
CO2: 24 mmol/L (ref 22–32)
Calcium: 8.6 mg/dL — ABNORMAL LOW (ref 8.9–10.3)
Chloride: 104 mmol/L (ref 98–111)
Creatinine, Ser: 0.86 mg/dL (ref 0.44–1.00)
GFR calc Af Amer: >60 mL/min (ref >60)
GFR calc non Af Amer: >60 mL/min (ref >60)
Glucose, Bld: 177 mg/dL — ABNORMAL HIGH (ref 70–99)
Potassium: 3.6 mmol/L (ref 3.5–5.1)
Sodium: 137 mmol/L (ref 135–145)

## 2018-10-22 LAB — MAGNESIUM: Magnesium: 1.9 mg/dL (ref 1.7–2.4)

## 2018-10-22 MED ORDER — DICLOFENAC SODIUM 1 % TD GEL
2.0000 g | Freq: Four times a day (QID) | TRANSDERMAL | Status: DC | PRN
  Administered 2018-10-25: 2 g via TOPICAL
  Filled 2018-10-22: qty 100

## 2018-10-22 NOTE — Discharge Summary (Signed)
Physician Discharge Summary  [name] [name] TGG:[id number] DOB: [birth year] DOA: 10/21/2018  PCP: Helane Rima, DO  Admit date: 10/21/2018 Discharge date: 10/23/2018  Admitted From: Home Disposition: SNF  Recommendations for Outpatient Follow-up:  1. Follow up with PCP in 1-2 weeks 2. Please obtain BMP/CBC in one week   Home Health: No Equipment/Devices: Rolling walker  Discharge Condition: Stable CODE STATUS: Full Diet recommendation: Heart Healthy   Brief/Interim Summary:  #) Acute low back pain/lower extremity pain: Patient presented with nonspecific lower extremity pain and acute low back pain.  She did require IV pain medications for this.  She was evaluated by physical therapy and recommended skilled nursing facility as well as a rolling walker which she was using.  MRI done in the emergency department of her lumbar spine showed no evidence of acute cord compression but did show severe degenerative joint disease.  She did not have any red flag signs including no weight loss, fevers, loss of tone of her bladder or bowel.  #).Non-obstructive coronary artery disease/hypertension/hyperlipidemia: Patient was continued on home carvedilol, isosorbide mononitrate, hydralazine, spironolactone, aspirin, sacubitril/valsartan.  #) Stage III CKD: This was stable.  #) Chronic diastolic grade 1 heart failure: Home diuretics were held while she was given gentle IV fluids as she was felt to be mildly dehydrated.  She may restart her home diuretics.  #) Hypoproliferative anemia: Patient was continued on home iron supplementation.  #) History of PE: Patient was continued on home rivaroxaban.  #) rheumatoid arthritis: Patient was continued on home hydroxychloroquine, leflunomide prednisone.  #) Type 2 diabetes: Patient was maintained on sliding scale here.  She may restart her home metformin on discharge.  #) pain/psych: Patient was continued on home gabapentin.  Discharge  Diagnoses:  Principal Problem:   Acute pain Active Problems:   Type 2 diabetes mellitus with hyperlipidemia (HCC), patient declines statin   Hypertension associated with diabetes (HCC)   Rheumatoid arthritis involving multiple sites (HCC)   Anemia of chronic disease   CKD stage 3 due to type 2 diabetes mellitus (HCC)   High risk medications, long-term use   Hypokalemia   Pulmonary embolism (HCC), 07/2018, on Xarelto with goal to DC after 6 months if more active    Discharge Instructions   Allergies as of 10/23/2018      Reactions   Clonidine Derivatives Swelling   [name]   Fish Allergy Diarrhea, Swelling, Other (See Comments)   Turns skin "black," but can tolerate white fish Salmon- Diarrhea   Shellfish Allergy Hives   Doxycycline Rash   Indomethacin Other (See Comments)   Reaction not recalled by the patient   Lyrica [pregabalin] Other (See Comments)   Hallucinations   Methyldopa Other (See Comments)   Aldomet (for hypertension): Reaction not recalled by the patient   Morphine And Related Other (See Comments)   Family reports it drops her bp that she needs iv fluids   Orange Fruit [citrus] Other (See Comments)   Indigestion/heartburn   Strawberry (diagnostic) Itching   Cetirizine Hcl Itching, Rash   Codeine Itching   Levaquin [levofloxacin In D5w] Rash   Tomato Rash      Medication List    TAKE these medications   albuterol 108 (90 Base) MCG/ACT inhaler Commonly known as:  PROVENTIL HFA;VENTOLIN HFA Inhale 1-2 puffs into the lungs every 6 (six) hours as needed for wheezing or shortness of breath.   allopurinol 100 MG tablet Commonly known as:  ZYLOPRIM Take 100 mg by mouth at bedtime.   carvedilol 6.25 MG tablet Commonly known as:  COREG Take 1 tablet (6.25 mg total) by mouth 2 (two) times daily with a meal.   D3-1000 25 MCG (1000 UT) capsule Generic drug:  Cholecalciferol Take 1,000  Units by mouth daily.   diclofenac sodium 1 % Gel Commonly known as:  VOLTAREN Apply 1 application topically daily. What changed:    when to take this  reasons to take this  additional instructions   ENSURE Take 237 mLs by mouth every evening.   ENTRESTO 49-51 MG Generic drug:  sacubitril-valsartan Take 1 tablet by mouth daily at 12 noon.   furosemide 40 MG tablet Commonly known as:  LASIX Take 0.5 tablets (20 mg total) by mouth daily.   gabapentin 100 MG capsule Commonly known as:  NEURONTIN Take 1 capsule (100 mg total) by mouth at bedtime.   hydrALAZINE 100 MG tablet Commonly known as:  APRESOLINE Take 100 mg by mouth 3 (three) times daily.   hydroxychloroquine 200 MG tablet Commonly known as:  PLAQUENIL Take 200 mg by mouth 2 (two) times daily.   hydroxypropyl methylcellulose / hypromellose 2.5 % ophthalmic solution Commonly known as:  ISOPTO TEARS / GONIOVISC Place 1 drop into both eyes daily.   isosorbide mononitrate 30 MG 24 hr tablet Commonly known as:  IMDUR Take 1 tablet (30 mg total) by mouth at bedtime.   leflunomide 20 MG tablet Commonly known as:  ARAVA Take 20 mg by mouth daily.   Olopatadine HCl 0.2 % Soln Place 1 drop into both eyes daily.   potassium chloride SA 20 MEQ tablet Commonly known as:  K-DUR,KLOR-CON Take 1 tablet (20 mEq total) by mouth 2 (two) times daily.   predniSONE 10 MG tablet Commonly known as:  DELTASONE TAKE 1 TABLET DAILY What changed:  when to take this   rivaroxaban 20 MG Tabs tablet Commonly known as:  XARELTO Take 20 mg by mouth daily with supper.   spironolactone 25 MG tablet Commonly known as:  ALDACTONE Take 12.5 mg by mouth daily.   traMADol 50 MG tablet Commonly known as:  ULTRAM Take 50 mg by mouth every 6 (six) hours as needed for moderate pain or severe pain.   triamcinolone cream 0.1 % Commonly known as:  KENALOG Apply 1 application topically 2 (two) times daily as needed (for itching- to  affected sites).   vitamin C 500 MG tablet Commonly known as:  ASCORBIC ACID Take 500 mg by mouth daily.       Allergies  Allergen Reactions  . Clonidine Derivatives Swelling    [name]  . Fish Allergy Diarrhea, Swelling and Other (See Comments)    Turns skin "black," but can tolerate white fish Salmon- Diarrhea  . Shellfish Allergy Hives  . Doxycycline Rash  . Indomethacin Other (See Comments)    Reaction not recalled by the patient  . Lyrica [Pregabalin] Other (See Comments)    Hallucinations   . Methyldopa Other (See Comments)    Aldomet (for hypertension): Reaction not recalled by the patient  . Morphine And Related Other (See Comments)    Family reports it drops her bp that she needs iv fluids  . Orange Fruit [Citrus] Other (See Comments)    Indigestion/heartburn  . Strawberry (Diagnostic) Itching  . Cetirizine Hcl Itching and Rash  . Codeine Itching  . Levaquin [Levofloxacin In D5w] Rash  . Tomato Rash  Consultations:  None   Procedures/Studies: Dg Lumbar Spine Complete  Result Date: 10/21/2018 CLINICAL DATA:  Bilateral hip and leg pain EXAM: LUMBAR SPINE - COMPLETE 4+ VIEW COMPARISON:  Abdominal CT 09/11/2015 FINDINGS: Transitional S1 vertebra with rudimentary S1-2 disc space. Remote L1 superior endplate fracture based on prior. Height loss is mild. No acute fracture is seen. Advanced diffuse degenerative disc narrowing. Diffuse advanced facet arthropathy with spurring and anterolisthesis at L4-5 and L5-S1. There is milder retrolisthesis at L2-3. IMPRESSION: 1. No acute finding. 2. Remote L1 superior endplate fracture. 3. Advanced degenerative disease with multilevel listhesis. Electronically Signed   By: Marnee Spring [name]   On: 10/21/2018 10:30   Mr Lumbar Spine Wo Contrast  Result Date: 10/21/2018 CLINICAL DATA:  84 year old female with severe pain radiating to the hips and legs,  worse on the right. EXAM: MRI LUMBAR SPINE WITHOUT CONTRAST TECHNIQUE: Multiplanar, multisequence MR imaging of the lumbar spine was performed. No intravenous contrast was administered. COMPARISON:  Lumbar radiographs 1014 hours today. Abdomen MRI 06/23/2018, and earlier. FINDINGS: Segmentation: Nearly full size S1-S2 disc space, but otherwise normal segmentation with 5 lumbar type vertebral bodies and sacralized S1 level. Same numbering as on the plain films today. Alignment: Mild levoconvex lumbar scoliosis. Chronic grade 1 anterolisthesis at L4-L5 and L5-S1 is stable to mildly increased since 2016. Chronic mild retrolisthesis of L2 on L3 is stable. Vertebrae: No marrow edema or evidence of acute osseous abnormality. Chronic L1 compression fracture. Degenerative endplate signal changes in the lumbar spine. Intact visible sacrum and SI joints. Conus medullaris and cauda equina: Conus extends to the L1-L2 level. No lower spinal cord or conus signal abnormality. Paraspinal and other soft tissues: Stable visible abdomen. Negative paraspinal soft tissues aside from posterior subcutaneous edema. Disc levels: No lower thoracic spinal stenosis. T12-L1: Mild grade 1 anterolisthesis with mild to moderate facet and ligament flavum hypertrophy. Mild disc bulge. No spinal stenosis. Mild left and moderate right T12 foraminal stenosis. L1-L2:  Mild to moderate facet hypertrophy.  No stenosis. L2-L3: Chronic disc space loss and mild retrolisthesis. Circumferential disc bulge with mild to moderate facet and ligament flavum hypertrophy. Mild spinal stenosis. Mild left and moderate right L2 foraminal stenosis. L3-L4: Chronic disc space loss. Vacuum disc here in 2016. Circumferential but mostly far lateral disc bulging and endplate spurring. Moderate facet and ligament flavum hypertrophy. Mild spinal and right lateral recess stenosis (right L4 nerve level). Mild left and moderate right L3 foraminal stenosis. L4-L5: Chronic  anterolisthesis, disc space loss and vacuum disc. Circumferential disc/pseudo disc with severe facet and ligament flavum hypertrophy. Severe spinal stenosis with left greater than right lateral recess stenosis (L5 nerve levels). Moderate to severe bilateral L4 foraminal stenosis. L5-S1: Chronic anterolisthesis with disc space loss and vacuum disc. Circumferential disc/pseudo disc. Severe facet and ligament flavum hypertrophy. Severe spinal and bilateral lateral recess stenosis (S1 nerve levels). Moderate left and moderate to severe right L5 foraminal stenosis. S1-S2: Full size disc space, otherwise negative with no stenosis. IMPRESSION: 1. No acute osseous abnormality. Chronic lumbar spondylolisthesis. Chronic L1 compression fracture. 2. Severe multifactorial spinal stenosis at L4-L5 and L5-S1, with severe lateral recess stenosis worse at the latter. Moderate to severe bilateral L5 foraminal stenosis at both levels. 3. Mild spinal stenosis at L2-L3 and L3-L4 with up to moderate right neural foraminal stenosis. Electronically Signed   By: Odessa Fleming [name]   On: 10/21/2018 17:32   Dg Hip Unilat W Or Wo Pelvis 2-3 Views Right  Result Date: 10/21/2018  CLINICAL DATA:  BILATERAL hip and leg pain EXAM: DG HIP (WITH OR WITHOUT PELVIS) 2-3V RIGHT COMPARISON:  Pelvic radiograph 01/24/2015, CT abdomen and pelvis 06/12/2014 FINDINGS: [place] mineralization. Narrowing of hip joints bilaterally. SI joints preserved. No acute fracture or dislocation. Bubbly well-circumscribed lytic lesion at the proximal LEFT femoral diaphysis lesion again identified, 4.7 x 2.0 cm, present and not significantly changed since at least 01/24/2015 and present on the prior CT exam from 2015 as well, generally benign in character. Scattered atherosclerotic calcifications. IMPRESSION: Degenerative changes of the hip joints bilaterally. No acute bony abnormalities. Chronic benign-appearing lytic lesion at proximal LEFT femoral diaphysis not significantly  since 01/24/2015. Electronically Signed   By: Ulyses Southward [name]   On: 10/21/2018 10:37      Subjective:   Discharge Exam: Vitals:   10/23/18 0923 10/23/18 0923  BP: (!) 141/75 (!) 141/75  Pulse: 89 93  Resp:  20  Temp:  98.2 F (36.8 C)  SpO2:  100%   Vitals:   10/22/18 2110 10/23/18 0529 10/23/18 0923 10/23/18 0923  BP: 131/69 118/75 (!) 141/75 (!) 141/75  Pulse: 94 80 89 93  Resp: 18 16  20   Temp: 98 F (36.7 C) 98.2 F (36.8 C)  98.2 F (36.8 C)  TempSrc: Oral Oral  Oral  SpO2: 99% 100%  100%  Weight:      Height:        General exam:Appears calm and comfortable  Respiratory system: Clear to auscultation. Respiratory effort normal. Cardiovascular system:Regular rate and rhythm, no murmurs Gastrointestinal system:Abdomen is nondistended, soft and nontender. No organomegaly or masses felt. Normal bowel sounds heard. Central nervous system:Alert and oriented. No focal neurological deficits. Extremities: No lower extremity edema. Skin: No rashes over visible skin Psychiatry:Judgement and insight appear normal. Mood &affect appropriate.     The results of significant diagnostics from this hospitalization (including imaging, microbiology, ancillary and laboratory) are listed below for reference.     Microbiology: No results found for this or any previous visit (from the past 240 hour(s)).   Labs: BNP (last 3 results) Recent Labs    05/28/18 1614 06/22/18 2153 07/23/18 2100  BNP 2,077.6* 1,146.7* 138.3*   Basic Metabolic Panel: Recent Labs  Lab 10/21/18 0954 10/22/18 0344  NA 136 137  K 3.3* 3.6  CL 104 104  CO2 22 24  GLUCOSE 96 177*  BUN 18 17  CREATININE 1.01* 0.86  CALCIUM 8.4* 8.6*  MG  --  1.9   Liver Function Tests: Recent Labs  Lab 10/21/18 0954  AST 20  ALT 11  ALKPHOS 41  BILITOT 1.0  PROT 5.9*  ALBUMIN 3.3*   No results for input(s): LIPASE, AMYLASE in the last 168 hours. No results for input(s): AMMONIA in the last  168 hours. CBC: Recent Labs  Lab 10/21/18 0954 10/22/18 0344  WBC 7.7 6.0  NEUTROABS 4.7  --   HGB 9.8* 10.6*  HCT 31.4* 33.9*  MCV 99.4 100.0  PLT 247 256   Cardiac Enzymes: No results for input(s): CKTOTAL, CKMB, CKMBINDEX, TROPONINI in the last 168 hours. BNP: Invalid input(s): POCBNP CBG: No results for input(s): GLUCAP in the last 168 hours. D-Dimer No results for input(s): DDIMER in the last 72 hours. Hgb A1c No results for input(s): HGBA1C in the last 72 hours. Lipid Profile No results for input(s): CHOL, HDL, LDLCALC, TRIG, CHOLHDL, LDLDIRECT in the last 72 hours. Thyroid function studies No results for input(s): TSH, T4TOTAL, T3FREE, THYROIDAB in the last 72  hours.  Invalid input(s): FREET3 Anemia work up Recent Labs    10/21/18 2140  VITAMINB12 419  FERRITIN 215  TIBC 233*  IRON 17*   Urinalysis    Component Value Date/Time   COLORURINE STRAW (A) 10/21/2018 1131   APPEARANCEUR CLEAR 10/21/2018 1131   LABSPEC 1.009 10/21/2018 1131   PHURINE 6.0 10/21/2018 1131   GLUCOSEU NEGATIVE 10/21/2018 1131   HGBUR NEGATIVE 10/21/2018 1131   BILIRUBINUR NEGATIVE 10/21/2018 1131   BILIRUBINUR Negative 01/13/2017 1234   KETONESUR NEGATIVE 10/21/2018 1131   PROTEINUR NEGATIVE 10/21/2018 1131   UROBILINOGEN 0.2 01/13/2017 1234   UROBILINOGEN 1.0 09/11/2015 0135   NITRITE NEGATIVE 10/21/2018 1131   LEUKOCYTESUR NEGATIVE 10/21/2018 1131   Sepsis Labs Invalid input(s): PROCALCITONIN,  WBC,  LACTICIDVEN Microbiology No results found for this or any previous visit (from the past 240 hour(s)).   Time coordinating discharge: 35  SIGNED:   Delaine Lame, MD  [facility] 10/23/2018, 11:34 AM  If 7PM-7AM, please contact night-coverage [email or web address] Password [email or web address]

## 2018-10-22 NOTE — Discharge Instructions (Signed)

## 2018-10-22 NOTE — Plan of Care (Signed)
Plan of care reviewed. 

## 2018-10-22 NOTE — Progress Notes (Signed)
PROGRESS NOTE    FAJR FIFE  TFT:[id number] DOB: 12/21/1929 DOA: 10/21/2018 PCP: Helane Rima, DO   Brief Narrative:  84 year old with past medical history relevant for rheumatoid arthritis, hypertension, hyperlipidemia, coronary artery disease (catheterization on 06/01/2018 with nonobstructive disease), cardiomyopathy with most recent echo on 08/30/2018 Showing resolution with grade 1 diastolic dysfunction, gout, type 2 diabetes on oral hypoglycemics, stage III CKD, pulmonary embolism on rivaroxaban, CVA, iron deficiency anemia/hypoproliferative anemia who presented with acute low back and lower extremity pain and weakness.   Assessment & Plan:   Principal Problem:   Acute pain Active Problems:   Type 2 diabetes mellitus with hyperlipidemia (HCC), patient declines statin   Hypertension associated with diabetes (HCC)   Rheumatoid arthritis involving multiple sites (HCC)   Anemia of chronic disease   CKD stage 3 due to type 2 diabetes mellitus (HCC)   High risk medications, long-term use   Hypokalemia   Pulmonary embolism (HCC), 07/2018, on Xarelto with goal to DC after 6 months if more active  #) Acute low back pain/lower extremity weakness/lower extremity pain: MRI on admission shows no evidence of cord embarrassment.  Suspect most likely slow progression of spinal stenosis and spinal arthritis.  Her symptoms are not classical for radiculopathy though she very well might have both severe arthritis in the spine as well as the hips. -Physical therapy recommends skilled nursing facility -Continue IV hydromorphone for pain control -Social work consult for placement  #) nonobstructive coronary artery disease/hypertension/hyperlipidemia: Patient reportedly had a history of chronic systolic heart failure.  This is resolved with appropriate therapy. - Continue carvedilol 6.25 mg twice daily - Continue isosorbide mononitrate 30 mg nightly - Continue aspirin  -Continue spironolactone  12.5 mg daily -Continue sacubitril/valsartan daily -continue hydralazine 20 mg 3 times daily  #) Stage III CKD: Stable -Hold nephrotoxins  #)  chronic diastolic heart failure grade 1: -Continue oral furosemide  #) Hypoproliferative anemia/stage III CKD: -Hold nephrotoxins -Continue iron supplementation twice daily  #) History of pulmonary embolism: This appears to be stable on recent CT imaging -Continue rivaroxaban  #) Rheumatoid arthritis: - Continue leflunomide 20 mg daily -Continue hydroxychloroquine 200 mg twice daily -Continue prednisone 10 mg daily  #) Type 2 diabetes: -Sliding scale insulin, AC at bedtime -Hold metformin 500 mg twice daily  #) Pain/psych: -Continue gabapentin 100 mg nightly  Fluids: Gentle IV fluids Elect lites: Monitor and supplement Nutrition: Heart healthy/carb restricted diet  Prophylaxis: On rivaroxaban  Disposition: Pending PT evaluation and IV fluids  Full code   Consultants:   None  Procedures:   None  Antimicrobials:   None   Subjective: This morning patient reports that her back pain is still present.  She continues to report lower extremity weakness.  She reports that it is primarily unchanged but she has not tried to get out of bed.  Her lower extremity pain is somewhat better but not dramatically.  She fortunately does not have any red flag signs including no saddle anesthesia, loss of bladder or bowel control, severe bilateral radiculopathy, fevers.  Objective: Vitals:   10/21/18 1957 10/21/18 2048 10/22/18 1120 10/22/18 1329  BP: (!) 185/101 (!) 156/89 (!) 144/79 128/87  Pulse: (!) 109 (!) 107 60 (!) 107  Resp: 20 16 16 16   Temp:  98.5 F (36.9 C) 98 F (36.7 C) 97.6 F (36.4 C)  TempSrc:  Oral Oral Oral  SpO2: 99% 99% 100% 97%  Weight:  79.3 kg    Height:  5\' 4"  (1.626  m)      Intake/Output Summary (Last 24 hours) at 10/22/2018 1619 Last data filed at 10/22/2018 1500 Gross per 24 hour  Intake  -  Output 700 ml  Net -700 ml   Filed Weights   10/21/18 2048  Weight: 79.3 kg    Examination:  General exam:Appears calm and comfortable  Respiratory system: Clear to auscultation. Respiratory effort normal. Cardiovascular system:Regular rate and rhythm, no murmurs Gastrointestinal system:Abdomen is nondistended, soft and nontender. No organomegaly or masses felt. Normal bowel sounds heard. Central nervous system:Alert and oriented. No focal neurological deficits. Extremities: No lower extremity edema.  Lower extremity strength is 5 out of 5, straight leg test negative bilaterally, no midline tenderness to palpation Skin: No rashes over visible skin Psychiatry:Judgement and insight appear normal. Mood &affect appropriate    Data Reviewed: I have personally reviewed following labs and imaging studies  CBC: Recent Labs  Lab 10/21/18 0954 10/22/18 0344  WBC 7.7 6.0  NEUTROABS 4.7  --   HGB 9.8* 10.6*  HCT 31.4* 33.9*  MCV 99.4 100.0  PLT 247 256   Basic Metabolic Panel: Recent Labs  Lab 10/21/18 0954 10/22/18 0344  NA 136 137  K 3.3* 3.6  CL 104 104  CO2 22 24  GLUCOSE 96 177*  BUN 18 17  CREATININE 1.01* 0.86  CALCIUM 8.4* 8.6*  MG  --  1.9   GFR: Estimated Creatinine Clearance: 47.8 mL/min (by C-G formula based on SCr of 0.86 mg/dL). Liver Function Tests: Recent Labs  Lab 10/21/18 0954  AST 20  ALT 11  ALKPHOS 41  BILITOT 1.0  PROT 5.9*  ALBUMIN 3.3*   No results for input(s): LIPASE, AMYLASE in the last 168 hours. No results for input(s): AMMONIA in the last 168 hours. Coagulation Profile: No results for input(s): INR, PROTIME in the last 168 hours. Cardiac Enzymes: No results for input(s): CKTOTAL, CKMB, CKMBINDEX, TROPONINI in the last 168 hours. BNP (last 3 results) No results for input(s): PROBNP in the last 8760 hours. HbA1C: No results for input(s): HGBA1C in the last 72 hours. CBG: No results for input(s): GLUCAP in the last 168  hours. Lipid Profile: No results for input(s): CHOL, HDL, LDLCALC, TRIG, CHOLHDL, LDLDIRECT in the last 72 hours. Thyroid Function Tests: No results for input(s): TSH, T4TOTAL, FREET4, T3FREE, THYROIDAB in the last 72 hours. Anemia Panel: Recent Labs    10/21/18 2140  VITAMINB12 419  FERRITIN 215  TIBC 233*  IRON 17*   Sepsis Labs: No results for input(s): PROCALCITON, LATICACIDVEN in the last 168 hours.  No results found for this or any previous visit (from the past 240 hour(s)).       Radiology Studies: Dg Lumbar Spine Complete  Result Date: 10/21/2018 CLINICAL DATA:  Bilateral hip and leg pain EXAM: LUMBAR SPINE - COMPLETE 4+ VIEW COMPARISON:  Abdominal CT 09/11/2015 FINDINGS: Transitional S1 vertebra with rudimentary S1-2 disc space. Remote L1 superior endplate fracture based on prior. Height loss is mild. No acute fracture is seen. Advanced diffuse degenerative disc narrowing. Diffuse advanced facet arthropathy with spurring and anterolisthesis at L4-5 and L5-S1. There is milder retrolisthesis at L2-3. IMPRESSION: 1. No acute finding. 2. Remote L1 superior endplate fracture. 3. Advanced degenerative disease with multilevel listhesis. Electronically Signed   By: Marnee Spring [name]   On: 10/21/2018 10:30   Mr Lumbar Spine Wo Contrast  Result Date: 10/21/2018 CLINICAL DATA:  84 year old female with severe pain radiating to the hips and legs, worse on the  right. EXAM: MRI LUMBAR SPINE WITHOUT CONTRAST TECHNIQUE: Multiplanar, multisequence MR imaging of the lumbar spine was performed. No intravenous contrast was administered. COMPARISON:  Lumbar radiographs 1014 hours today. Abdomen MRI 06/23/2018, and earlier. FINDINGS: Segmentation: Nearly full size S1-S2 disc space, but otherwise normal segmentation with 5 lumbar type vertebral bodies and sacralized S1 level. Same numbering as on the plain films today. Alignment: Mild levoconvex lumbar scoliosis. Chronic grade 1 anterolisthesis  at L4-L5 and L5-S1 is stable to mildly increased since 2016. Chronic mild retrolisthesis of L2 on L3 is stable. Vertebrae: No marrow edema or evidence of acute osseous abnormality. Chronic L1 compression fracture. Degenerative endplate signal changes in the lumbar spine. Intact visible sacrum and SI joints. Conus medullaris and cauda equina: Conus extends to the L1-L2 level. No lower spinal cord or conus signal abnormality. Paraspinal and other soft tissues: Stable visible abdomen. Negative paraspinal soft tissues aside from posterior subcutaneous edema. Disc levels: No lower thoracic spinal stenosis. T12-L1: Mild grade 1 anterolisthesis with mild to moderate facet and ligament flavum hypertrophy. Mild disc bulge. No spinal stenosis. Mild left and moderate right T12 foraminal stenosis. L1-L2:  Mild to moderate facet hypertrophy.  No stenosis. L2-L3: Chronic disc space loss and mild retrolisthesis. Circumferential disc bulge with mild to moderate facet and ligament flavum hypertrophy. Mild spinal stenosis. Mild left and moderate right L2 foraminal stenosis. L3-L4: Chronic disc space loss. Vacuum disc here in 2016. Circumferential but mostly far lateral disc bulging and endplate spurring. Moderate facet and ligament flavum hypertrophy. Mild spinal and right lateral recess stenosis (right L4 nerve level). Mild left and moderate right L3 foraminal stenosis. L4-L5: Chronic anterolisthesis, disc space loss and vacuum disc. Circumferential disc/pseudo disc with severe facet and ligament flavum hypertrophy. Severe spinal stenosis with left greater than right lateral recess stenosis (L5 nerve levels). Moderate to severe bilateral L4 foraminal stenosis. L5-S1: Chronic anterolisthesis with disc space loss and vacuum disc. Circumferential disc/pseudo disc. Severe facet and ligament flavum hypertrophy. Severe spinal and bilateral lateral recess stenosis (S1 nerve levels). Moderate left and moderate to severe right L5 foraminal  stenosis. S1-S2: Full size disc space, otherwise negative with no stenosis. IMPRESSION: 1. No acute osseous abnormality. Chronic lumbar spondylolisthesis. Chronic L1 compression fracture. 2. Severe multifactorial spinal stenosis at L4-L5 and L5-S1, with severe lateral recess stenosis worse at the latter. Moderate to severe bilateral L5 foraminal stenosis at both levels. 3. Mild spinal stenosis at L2-L3 and L3-L4 with up to moderate right neural foraminal stenosis. Electronically Signed   By: Odessa Fleming [name]   On: 10/21/2018 17:32   Dg Hip Unilat W Or Wo Pelvis 2-3 Views Right  Result Date: 10/21/2018 CLINICAL DATA:  BILATERAL hip and leg pain EXAM: DG HIP (WITH OR WITHOUT PELVIS) 2-3V RIGHT COMPARISON:  Pelvic radiograph 01/24/2015, CT abdomen and pelvis 06/12/2014 FINDINGS: [place] mineralization. Narrowing of hip joints bilaterally. SI joints preserved. No acute fracture or dislocation. Bubbly well-circumscribed lytic lesion at the proximal LEFT femoral diaphysis lesion again identified, 4.7 x 2.0 cm, present and not significantly changed since at least 01/24/2015 and present on the prior CT exam from 2015 as well, generally benign in character. Scattered atherosclerotic calcifications. IMPRESSION: Degenerative changes of the hip joints bilaterally. No acute bony abnormalities. Chronic benign-appearing lytic lesion at proximal LEFT femoral diaphysis not significantly since 01/24/2015. Electronically Signed   By: Ulyses Southward [name]   On: 10/21/2018 10:37        Scheduled Meds: . allopurinol  100 mg Oral QHS  .  carvedilol  6.25 mg Oral BID WC  . feeding supplement (ENSURE ENLIVE)  237 mL Oral BID BM  . furosemide  20 mg Oral Daily  . gabapentin  100 mg Oral QHS  . hydrALAZINE  100 mg Oral TID  . hydroxychloroquine  200 mg Oral BID  . isosorbide mononitrate  30 mg Oral QHS  . leflunomide  20 mg Oral Daily  . olopatadine  1 drop Both Eyes BID  . potassium chloride SA  20 mEq Oral BID  . predniSONE   10 mg Oral Q breakfast  . rivaroxaban  20 mg Oral Q supper  . sacubitril-valsartan  1 tablet Oral Q1200  . sodium chloride flush  3 mL Intravenous Q12H  . spironolactone  12.5 mg Oral Daily  . traMADol  50 mg Oral Q12H   Continuous Infusions: . sodium chloride       LOS: 0 days    Time spent: 35    Delaine Lame, MD [facility]  If 7PM-7AM, please contact night-coverage [email or web address] Password [email or web address] 10/22/2018, 4:19 PM

## 2018-10-22 NOTE — Plan of Care (Signed)
Pt alert and oriented, minimal complaints of pain this am. States she is feeling "a lot better today." plan to work with PT/OT. RN will monitor.

## 2018-10-22 NOTE — Evaluation (Signed)
Physical Therapy Evaluation Patient Details Name: [name] [name] MRN: [id number] DOB: 1930/07/18 Today's Date: 10/22/2018   History of Present Illness  84 yo female was admitted with back and BLE's radiating pain without focal weakness or numbness on legs.  Had recent hospitalization for pancreatitis, but no notes of back pain previously.  Current onset was 2 days before admission.  PMHx:  CHF, CAD, MI, PE, Sz, CVA, DM, RA, gout.  Clinical Impression  Pt was seen for mobility assessment and noted her alterations of gait, instability standing and difficulty controlling gait after fatigue.  Her daughter arrived and talked with PT about her fears of pt falling.  Pt is demonstrating enough issues of balance and strength to merit a request for SNF admission.  Will continue to see her to see how far strengthening and control of mobility can be increased to shorten her rehab stay, as well as to increase safety in transition home if the stay is denied.  Pt is definitely at a higher fall risk to be home any time alone.    Follow Up Recommendations SNF    Equipment Recommendations  Rolling walker with 5" wheels    Recommendations for Other Services       Precautions / Restrictions Precautions Precautions: Fall Restrictions Weight Bearing Restrictions: No      Mobility  Bed Mobility Overal bed mobility: Needs Assistance Bed Mobility: Supine to Sit;Sit to Supine     Supine to sit: Min assist Sit to supine: Min guard   General bed mobility comments: scooting up bed with min assist  Transfers Overall transfer level: Needs assistance Equipment used: Rolling walker (2 wheeled);1 person hand held assist Transfers: Sit to/from Stand Sit to Stand: Min assist         General transfer comment: min from higher surfaces and mod from lower surfaces  Ambulation/Gait Ambulation/Gait assistance: Min assist Gait Distance (Feet): 50 Feet Assistive device: Rolling walker (2 wheeled);1 person  hand held assist Gait Pattern/deviations: Step-to pattern;Step-through pattern;Wide base of support;Trunk flexed Gait velocity: reduced   General Gait Details: pt has reduced tolerance for standing and controlling balance, using RW for stability but cannot control descent to chair well  Stairs            Wheelchair Mobility    Modified Rankin (Stroke Patients Only)       Balance Overall balance assessment: Needs assistance Sitting-balance support: Feet supported Sitting balance-Leahy Scale: Fair     Standing balance support: Bilateral upper extremity supported;During functional activity Standing balance-Leahy Scale: Poor                               Pertinent Vitals/Pain Pain Assessment: Faces Faces Pain Scale: Hurts little more Pain Location: back Pain Descriptors / Indicators: Grimacing Pain Intervention(s): Limited activity within patient's tolerance;Monitored during session;Premedicated before session;Repositioned;Other (comment)    Home Living Family/patient expects to be discharged to:: Private residence Living Arrangements: Alone Available Help at Discharge: Personal care attendant;Family;Available PRN/intermittently Type of Home: Apartment Home Access: Level entry     Home Layout: One level Home Equipment: Hand held shower head;Walker - 4 wheels;Walker - 2 wheels;Cane - single point;Tub bench;Wheelchair - manual;Grab bars - toilet Additional Comments: Aide 7 days/week 9-12 and occasionally has family support overnight    Prior Function Level of Independence: Needs assistance   Gait / Transfers Assistance Needed: Uses rollator for functional mobility.   ADL's / Homemaking Assistance Needed: Aide assists  with ADL, meals, and bed mobility.   Comments: Amb with rollator, Aide assists with bath, getting dressed, and laundry     Hand Dominance   Dominant Hand: Right    Extremity/Trunk Assessment   Upper Extremity Assessment Upper  Extremity Assessment: Overall WFL for tasks assessed    Lower Extremity Assessment Lower Extremity Assessment: Generalized weakness    Cervical / Trunk Assessment Cervical / Trunk Assessment: Normal  Communication   Communication: No difficulties  Cognition Arousal/Alertness: Awake/alert Behavior During Therapy: WFL for tasks assessed/performed Overall Cognitive Status: No family/caregiver present to determine baseline cognitive functioning                                 General Comments: has some minor struggles with safety and awareness of limits      General Comments General comments (skin integrity, edema, etc.): pt is demonstrating some hallmarks of being a higher fall risk, with strength changes, balance losses and wide based gait with difficulty controlling walker    Exercises     Assessment/Plan    PT Assessment Patient needs continued PT services  PT Problem List Decreased strength;Decreased range of motion;Decreased activity tolerance;Decreased balance;Decreased mobility;Decreased coordination;Decreased cognition;Decreased knowledge of use of DME;Decreased safety awareness;Obesity       PT Treatment Interventions DME instruction;Gait training;Functional mobility training;Therapeutic activities;Therapeutic exercise;Balance training;Neuromuscular re-education;Patient/family education    PT Goals (Current goals can be found in the Care Plan section)  Acute Rehab PT Goals Patient Stated Goal: to walk and get home PT Goal Formulation: With patient/family Time For Goal Achievement: 11/05/18 Potential to Achieve Goals: Good    Frequency Min 2X/week   Barriers to discharge Decreased caregiver support home alone at times    Co-evaluation               AM-PAC PT "6 Clicks" Mobility  Outcome Measure Help needed turning from your back to your side while in a flat bed without using bedrails?: A Little Help needed moving from lying on your back to  sitting on the side of a flat bed without using bedrails?: A Little Help needed moving to and from a bed to a chair (including a wheelchair)?: A Little Help needed standing up from a chair using your arms (e.g., wheelchair or bedside chair)?: A Lot Help needed to walk in hospital room?: A Little Help needed climbing 3-5 steps with a railing? : Total 6 Click Score: 15    End of Session Equipment Utilized During Treatment: Gait belt Activity Tolerance: Patient limited by fatigue Patient left: in bed;with call bell/phone within reach;with bed alarm set Nurse Communication: Mobility status PT Visit Diagnosis: Unsteadiness on feet (R26.81);Muscle weakness (generalized) (M62.81);Difficulty in walking, not elsewhere classified (R26.2)    Time: 0321-2248 PT Time Calculation (min) (ACUTE ONLY): 27 min   Charges:   PT Evaluation $PT Eval Moderate Complexity: 1 Mod PT Treatments $Gait Training: 8-22 mins       Ivar Drape 10/22/2018, 4:37 PM   Samul Dada, PT MS Acute Rehab Dept. Number: [facility] [id number] and [facility] [phone]

## 2018-10-23 DIAGNOSIS — R52 Pain, unspecified: Secondary | ICD-10-CM | POA: Diagnosis not present

## 2018-10-23 DIAGNOSIS — M5136 Other intervertebral disc degeneration, lumbar region: Secondary | ICD-10-CM | POA: Diagnosis not present

## 2018-10-23 LAB — FOLATE RBC
Folate, Hemolysate: 478.4 ng/mL
Folate, RBC: 1495 ng/mL (ref >498)
Hematocrit: 32 % — ABNORMAL LOW (ref 34.0–46.6)

## 2018-10-23 NOTE — Plan of Care (Signed)
  Problem: Education: Goal: Knowledge of General Education information will improve Description: Including pain rating scale, medication(s)/side effects and non-pharmacologic comfort measures Outcome: Progressing   Problem: Health Behavior/Discharge Planning: Goal: Ability to manage health-related needs will improve Outcome: Progressing   Problem: Clinical Measurements: Goal: Ability to maintain clinical measurements within normal limits will improve Outcome: Progressing Goal: Will remain free from infection Outcome: Progressing Goal: Diagnostic test results will improve Outcome: Progressing Goal: Respiratory complications will improve Outcome: Progressing Goal: Cardiovascular complication will be avoided Outcome: Progressing   Problem: Activity: Goal: Risk for activity intolerance will decrease Outcome: Progressing   Problem: Nutrition: Goal: Adequate nutrition will be maintained Outcome: Progressing   Problem: Coping: Goal: Level of anxiety will decrease Outcome: Progressing   Problem: Elimination: Goal: Will not experience complications related to bowel motility Outcome: Progressing   Problem: Safety: Goal: Ability to remain free from injury will improve Outcome: Progressing   Problem: Skin Integrity: Goal: Risk for impaired skin integrity will decrease Outcome: Progressing   

## 2018-10-23 NOTE — [place] FL2 (Signed)
[place] MEDICAID FL2 LEVEL OF CARE SCREENING TOOL     IDENTIFICATION  Patient Name: [name] [name] Birthdate: Jul 18, 1930 Sex: female Admission Date (Current Location): 10/21/2018  [facility] and [place] Number:  Producer, television/film/video and Address:  [facility],  [address] [place] [address]      Provider Number: [id number]  Attending Physician Name and Address:  Delaine Lame, MD  Relative Name and Phone Number:  Verlon Au: [phone]    Current Level of Care: Hospital Recommended Level of Care: Skilled Nursing Facility Prior Approval Number:    Date Approved/Denied:   PASRR Number: [id number]  Discharge Plan: SNF    Current Diagnoses: Patient Active Problem List   Diagnosis Date Noted  . Acute pain 10/21/2018  . Physical deconditioning 09/06/2018  . Hyperlipidemia associated with type 2 diabetes mellitus (HCC), refuses statin 09/06/2018  . Refusal of statin medication by patient 09/04/2018  . Deep vein thrombosis (DVT) of distal vein of right lower extremity (HCC) 07/30/2018  . Hypertensive heart disease with heart failure (HCC) 07/30/2018  . Pulmonary embolism (HCC), 07/2018, on Xarelto with goal to DC after 6 months if more active 07/10/2018  . DCM (dilated cardiomyopathy) (HCC)   . Benign hypertensive heart and kidney disease with CHF and stage 3 chronic kidney disease (HCC) 05/28/2018  . Osteoarthritis 05/06/2018  . Hypokalemia 02/02/2018  . Adhesive capsulitis of right shoulder 10/12/2017  . Idiopathic chronic gout of multiple sites with tophus 08/25/2017  . History of total knee arthroplasty, bilateral 02/18/2017  . History of rotator cuff surgery 02/18/2017  . High risk medications, long-term use 09/21/2016  . Chronic combined systolic and diastolic CHF (congestive heart failure) (HCC) 09/12/2015  . Bilateral edema of lower extremity 06/05/2014  . CKD stage 3 due to type 2 diabetes mellitus (HCC) 06/05/2014  . DM (diabetes  mellitus), type 2 with renal complications (HCC) 06/05/2014    Class: Chronic  . Anemia of chronic disease 05/01/2014  . Rheumatoid arthritis involving multiple sites (HCC) 12/19/2013    Class: Chronic  . CAD (coronary artery disease) 10/18/2013  . Hypertension associated with diabetes (HCC) 08/08/2013    Class: Chronic  . Fibromyalgia 05/10/2013  . Type 2 diabetes mellitus with hyperlipidemia (HCC), patient declines statin 04/27/2007  . Allergic rhinitis 04/27/2007  . Diverticulosis 04/27/2007    Orientation RESPIRATION BLADDER Height & Weight     Self, Time, Place, Situation  Normal External catheter Weight: 174 lb 13.2 oz (79.3 kg) Height:  5\' 4"  (162.6 cm)  BEHAVIORAL SYMPTOMS/MOOD NEUROLOGICAL BOWEL NUTRITION STATUS      Continent Diet(Heart healthy)  AMBULATORY STATUS COMMUNICATION OF NEEDS Skin   Limited Assist Verbally Normal                       Personal Care Assistance Level of Assistance  Bathing, Dressing, Feeding Bathing Assistance: Limited assistance Feeding assistance: Independent Dressing Assistance: Limited assistance     Functional Limitations Info  Speech, Hearing, Sight Sight Info: Adequate Hearing Info: Adequate Speech Info: Adequate    SPECIAL CARE FACTORS FREQUENCY  OT (By licensed OT), PT (By licensed PT)     PT Frequency: 5x/week OT Frequency: 5x/week            Contractures Contractures Info: Not present    Additional Factors Info  Code Status, Allergies Code Status Info: Full Allergies Info: CLONIDINE DERIVATIVES, FISH ALLERGY, SHELLFISH ALLERGY, DOXYCYCLINE, INDOMETHACIN, LYRICA PREGABALIN, METHYLDOPA, MORPHINE AND RELATED, ORANGE FRUIT CITRUS, STRAWBERRY (  DIAGNOSTIC), CETIRIZINE HCL, CODEINE, LEVAQUIN LEVOFLOXACIN IN D5W, TOMATO            Current Medications (10/23/2018):  This is the current hospital active medication list Current Facility-Administered Medications  Medication Dose Route Frequency Provider Last Rate  Last Dose  . 0.9 %  sodium chloride infusion  250 mL Intravenous PRN Wouk, Wilfred Curtis, MD      . allopurinol (ZYLOPRIM) tablet 100 mg  100 mg Oral QHS Kathrynn Running, MD   100 mg at 10/22/18 2108  . carvedilol (COREG) tablet 6.25 mg  6.25 mg Oral BID WC Kathrynn Running, MD   6.25 mg at 10/23/18 6222  . diclofenac sodium (VOLTAREN) 1 % transdermal gel 2 g  2 g Topical QID PRN Purohit, Shrey C, MD      . feeding supplement (ENSURE ENLIVE) (ENSURE ENLIVE) liquid 237 mL  237 mL Oral BID BM Wouk, Wilfred Curtis, MD   237 mL at 10/22/18 0933  . furosemide (LASIX) tablet 20 mg  20 mg Oral Daily Kathrynn Running, MD   20 mg at 10/23/18 9798  . gabapentin (NEURONTIN) capsule 100 mg  100 mg Oral QHS Kathrynn Running, MD   100 mg at 10/22/18 2109  . hydrALAZINE (APRESOLINE) tablet 100 mg  100 mg Oral TID Kathrynn Running, MD   100 mg at 10/23/18 9211  . HYDROmorphone (DILAUDID) injection 0.5 mg  0.5 mg Intravenous Q4H PRN Wouk, Wilfred Curtis, MD      . hydroxychloroquine (PLAQUENIL) tablet 200 mg  200 mg Oral BID Kathrynn Running, MD   200 mg at 10/23/18 0936  . isosorbide mononitrate (IMDUR) 24 hr tablet 30 mg  30 mg Oral QHS Kathrynn Running, MD   30 mg at 10/22/18 2107  . leflunomide (ARAVA) tablet 20 mg  20 mg Oral Daily Kathrynn Running, MD   20 mg at 10/23/18 0932  . olopatadine (PATANOL) 0.1 % ophthalmic solution 1 drop  1 drop Both Eyes BID Kathrynn Running, MD   1 drop at 10/23/18 [phone]  . potassium chloride SA (K-DUR,KLOR-CON) CR tablet 20 mEq  20 mEq Oral BID Kathrynn Running, MD   20 mEq at 10/23/18 4081  . predniSONE (DELTASONE) tablet 10 mg  10 mg Oral Q breakfast Kathrynn Running, MD   10 mg at 10/23/18 4481  . rivaroxaban (XARELTO) tablet 20 mg  20 mg Oral Q supper Kathrynn Running, MD   20 mg at 10/22/18 1722  . sacubitril-valsartan (ENTRESTO) 49-51 mg per tablet  1 tablet Oral Q1200 Kathrynn Running, MD   1 tablet at 10/22/18 1300  . sodium chloride flush (NS) 0.9  % injection 3 mL  3 mL Intravenous Q12H Wouk, Wilfred Curtis, MD   3 mL at 10/23/18 0930  . sodium chloride flush (NS) 0.9 % injection 3 mL  3 mL Intravenous PRN Wouk, Wilfred Curtis, MD      . spironolactone (ALDACTONE) tablet 12.5 mg  12.5 mg Oral Daily Kathrynn Running, MD   12.5 mg at 10/23/18 0932  . traMADol (ULTRAM) tablet 50 mg  50 mg Oral Q12H Wouk, Wilfred Curtis, MD   50 mg at 10/23/18 8563     Discharge Medications: Please see discharge summary for a list of discharge medications.  Relevant Imaging Results:  Relevant Lab Results:   Additional Information SSN: [id number]  Enid Cutter, [place]

## 2018-10-23 NOTE — Evaluation (Signed)
Occupational Therapy Evaluation Patient Details Name: [name] [name] MRN: [id number] DOB: 02-Feb-1930 Today's Date: 10/23/2018    History of Present Illness 84 yo female was admitted with back and BLE's radiating pain without focal weakness or numbness on legs.  Had recent hospitalization for pancreatitis, but no notes of back pain previously.  Current onset was 2 days before admission.  PMHx:  CHF, CAD, MI, PE, Sz, CVA, DM, RA, gout.   Clinical Impression   Pt admitted with above diagnoses, with generalized weakness and balance deficits limiting ability to complete functional mobility at desired level of ind. Some limited insight into deficits/safety awareness noted throughout evaluation. Per chart review, pt with significant back pain that also limits BADL participation, none noted at this time per report. Pt completed toileting at min guard level, needing min A to don brief and pants over hips. Supervision to complete grooming at sink. Min guard for bed mobility. Pt with decreased activity tolerance. Will benefit from acute OT to address problems listed below. Recommend SNF at d/c to help maximize safety and functional engagement in BADL.    Follow Up Recommendations  SNF    Equipment Recommendations  Other (comment)(defer to next venue)    Recommendations for Other Services       Precautions / Restrictions Precautions Precautions: Fall Restrictions Weight Bearing Restrictions: No      Mobility Bed Mobility Overal bed mobility: Needs Assistance         Sit to supine: Min guard      Transfers Overall transfer level: Needs assistance Equipment used: Rolling walker (2 wheeled) Transfers: Sit to/from Stand Sit to Stand: Min guard         General transfer comment: cues for hand placement    Balance Overall balance assessment: Needs assistance Sitting-balance support: Feet supported Sitting balance-Leahy Scale: Fair     Standing balance support: Bilateral upper  extremity supported;During functional activity Standing balance-Leahy Scale: Poor                             ADL either performed or assessed with clinical judgement   ADL Overall ADL's : Needs assistance/impaired Eating/Feeding: Set up;Sitting       Upper Body Bathing: Supervision/ safety;Standing Upper Body Bathing Details (indicate cue type and reason): to wash hands at sink Lower Body Bathing: Sit to/from stand;Minimal assistance   Upper Body Dressing : Minimal assistance;Sitting   Lower Body Dressing: Sit to/from stand;Minimal assistance Lower Body Dressing Details (indicate cue type and reason): to pull brief/pants past hip Toilet Transfer: Min Tax inspector Details (indicate cue type and reason): increased time, close guard for safety Toileting- Clothing Manipulation and Hygiene: Modified independent;Sit to/from stand   Tub/ Shower Transfer: Minimal assistance;Shower seat;Rolling walker   Functional mobility during ADLs: Min guard;Rolling walker General ADL Comments: pt presenting with decreased activity tolerance, needs increased time to engage in BADL. Pt recieves assist for majority of BADL at baseline     Vision Baseline Vision/History: Wears glasses Wears Glasses: At all times Patient Visual Report: No change from baseline       Perception     Praxis      Pertinent Vitals/Pain Pain Assessment: No/denies pain     Hand Dominance     Extremity/Trunk Assessment Upper Extremity Assessment Upper Extremity Assessment: Generalized weakness   Lower Extremity Assessment Lower Extremity Assessment: Generalized weakness       Communication Communication Communication: No difficulties  Cognition Arousal/Alertness: Awake/alert Behavior During Therapy: WFL for tasks assessed/performed Overall Cognitive Status: No family/caregiver present to determine baseline cognitive functioning                                  General Comments: some limited insight into deficits/safety   General Comments       Exercises     Shoulder Instructions      Home Living Family/patient expects to be discharged to:: Private residence Living Arrangements: Alone Available Help at Discharge: Personal care attendant;Family;Available PRN/intermittently(reports having aides from 9-1) Type of Home: Apartment("senior apartment") Home Access: Level entry     Home Layout: One level     Bathroom Shower/Tub: Producer, television/film/video: Handicapped height Bathroom Accessibility: Yes   Home Equipment: Stage manager - 4 wheels;Walker - 2 wheels;Cane - single point;Tub bench;Wheelchair - manual;Grab bars - toilet;Bedside commode   Additional Comments: Aide 7 days/week 9-1 and occasionally has family support overnight      Prior Functioning/Environment Level of Independence: Needs assistance  Gait / Transfers Assistance Needed: Uses rollator for functional mobility ADL's / Homemaking Assistance Needed: Aide assists with dressing in the morning, bathing, house work, and light meal prep. Pt has meals pre frozen from family and is able to microwave to feed self. Family occasionally comes to assist overnight             OT Problem List: Decreased strength;Decreased activity tolerance;Decreased knowledge of use of DME or AE;Impaired balance (sitting and/or standing);Decreased safety awareness      OT Treatment/Interventions: Self-care/ADL training;DME and/or AE instruction;Therapeutic activities;Balance training;Therapeutic exercise;Patient/family education    OT Goals(Current goals can be found in the care plan section) Acute Rehab OT Goals Patient Stated Goal: to get back to PLOF OT Goal Formulation: With patient Time For Goal Achievement: 11/06/18 Potential to Achieve Goals: Good  OT Frequency: Min 2X/week   Barriers to D/C:            Co-evaluation              AM-PAC OT "6  Clicks" Daily Activity     Outcome Measure Help from another person eating meals?: None Help from another person taking care of personal grooming?: A Little Help from another person toileting, which includes using toliet, bedpan, or urinal?: A Little Help from another person bathing (including washing, rinsing, drying)?: A Little Help from another person to put on and taking off regular upper body clothing?: None Help from another person to put on and taking off regular lower body clothing?: A Little 6 Click Score: 20   End of Session Equipment Utilized During Treatment: Gait belt;Rolling walker Nurse Communication: Mobility status  Activity Tolerance: Patient tolerated treatment well Patient left: in bed;with call bell/phone within reach;with nursing/sitter in room  OT Visit Diagnosis: Other abnormalities of gait and mobility (R26.89);Muscle weakness (generalized) (M62.81)                Time: 5176-1607 OT Time Calculation (min): 18 min Charges:  OT General Charges $OT Visit: 1 Visit OT Evaluation $OT Eval Moderate Complexity: 1 Mod OT Treatments $Self Care/Home Management : 8-22 mins  Dalphine Handing, MSOT, OTR/L Behavioral Health OT/ Acute Relief OT [facility] Office: [phone]  Dalphine Handing 10/23/2018, 2:38 PM

## 2018-10-23 NOTE — Progress Notes (Signed)
Nutrition Brief Note  Patient identified on the Malnutrition Screening Tool (MST) Report  [name] weight is stable. Pt currently eating 90% of meals. Breakfast this morning provided ~600 kcal and 26g protein.   Wt Readings from Last 15 Encounters:  10/21/18 79.3 kg  09/07/18 79.6 kg  08/31/18 80.3 kg  08/29/18 80.3 kg  08/15/18 78.5 kg  07/28/18 78.6 kg  07/27/18 77.3 kg  07/21/18 79.3 kg  07/12/18 78 kg  06/28/18 78 kg  06/27/18 78.1 kg  06/16/18 83 kg  06/15/18 82.8 kg  06/13/18 83 kg  06/07/18 83 kg    Body mass index is 30.01 kg/m. Patient meets criteria for obesity based on current BMI.   Current diet order is regular, patient is consuming approximately 90% of meals at this time. Labs and medications reviewed.   No nutrition interventions warranted at this time. If nutrition issues arise, please consult RD.   Tilda Franco, MS, RD, LDN [facility] Inpatient Clinical Dietitian Pager: [phone] After Hours Pager: [phone]

## 2018-10-23 NOTE — Clinical Social Work Note (Signed)
Clinical Social Work Assessment  Patient Details  Name: [name] [name] MRN: [id number] Date of Birth: 09/20/1930  Date of referral:  10/22/18               Reason for consult:  Facility Placement                Permission sought to share information with:  Facility Art therapist granted to share information::  Yes, Verbal Permission Granted  Name::     Excell Seltzer  Agency::  SNF  Relationship::  Daughter  Contact Information:  [phone]  Housing/Transportation Living arrangements for the past 2 months:  Single Family Home Source of Information:  Patient Patient Interpreter Needed:  None Criminal Activity/Legal Involvement Pertinent to Current Situation/Hospitalization:  No - Comment as needed Significant Relationships:  Adult Children, Other Family Members Lives with:  Self Do you feel safe going back to the place where you live?  Yes Need for family participation in patient care:  No (Coment)  Care giving concerns: Patient presented with nonspecific lower extremity pain and acute low back pain. PT recommending short term therapy at SNF before returning home. Patient lives alone.    Social Worker assessment / plan:  CSW met with patient at bedside to discuss SNF process and role. Patient is familiar with SNF and states she has been to [facility] 3 times in the past.  Patient lives alone and has a caregiver who comes from 9-1 every day. Her daughter lives nearby and is supportive.   Patient prefers to go to [facility] due to past positive experiences there.  Patient will need [facility] insurance auth before d/c. CSW will complete FL2 and send out referrals.    Employment status:  Retired Nurse, adult PT Recommendations:  [place] / Referral to community resources:  [place]   Patient/Family's Response to care:  Patient is agreeable to SNF placement at d/c. She has previously  been to SNFs and is familiar with process.  Patient/Family's Understanding of and Emotional Response to Diagnosis, Current Treatment, and Prognosis:  Patient understands treatment and SNF process. CSW explained [facility] insurance auth process and patient knows CSW will f/u.  Emotional Assessment Appearance:  Appears older than stated age Attitude/Demeanor/Rapport:    Affect (typically observed):  Pleasant, Accepting Orientation:  Oriented to Self, Oriented to Place, Oriented to  Time, Oriented to Situation Alcohol / Substance use:  Not Applicable Psych involvement (Current and /or in the community):  No (Comment)  Discharge Needs  Concerns to be addressed:  Care Coordination Readmission within the last 30 days:  No Current discharge risk:  Lives alone, Physical Impairment Barriers to Discharge:  [place], [place] 10/23/2018, 1:47 PM

## 2018-10-24 DIAGNOSIS — E1159 Type 2 diabetes mellitus with other circulatory complications: Secondary | ICD-10-CM

## 2018-10-24 DIAGNOSIS — E1122 Type 2 diabetes mellitus with diabetic chronic kidney disease: Secondary | ICD-10-CM

## 2018-10-24 DIAGNOSIS — I10 Essential (primary) hypertension: Secondary | ICD-10-CM

## 2018-10-24 DIAGNOSIS — R52 Pain, unspecified: Secondary | ICD-10-CM | POA: Diagnosis not present

## 2018-10-24 DIAGNOSIS — M0579 Rheumatoid arthritis with rheumatoid factor of multiple sites without organ or systems involvement: Secondary | ICD-10-CM | POA: Diagnosis not present

## 2018-10-24 DIAGNOSIS — M5136 Other intervertebral disc degeneration, lumbar region: Secondary | ICD-10-CM | POA: Diagnosis not present

## 2018-10-24 DIAGNOSIS — N183 Chronic kidney disease, stage 3 (moderate): Secondary | ICD-10-CM

## 2018-10-24 DIAGNOSIS — D638 Anemia in other chronic diseases classified elsewhere: Secondary | ICD-10-CM | POA: Diagnosis not present

## 2018-10-24 MED ORDER — TRAMADOL HCL 50 MG PO TABS: 50.0000 mg | ORAL_TABLET | Freq: Four times a day (QID) | ORAL | 0 refills | Status: DC | PRN

## 2018-10-24 NOTE — Progress Notes (Signed)
Physical Therapy Treatment Patient Details Name: [name] [name] MRN: [id number] DOB: Mar 29, 1930 Today's Date: 10/24/2018    History of Present Illness 84 yo female was admitted with back and BLE's radiating pain without focal weakness or numbness on legs.  Had recent hospitalization for pancreatitis, but no notes of back pain previously.  Current onset was 2 days before admission.  PMHx:  CHF, CAD, MI, PE, Sz, CVA, DM, RA, gout.    PT Comments    Pt OOB in recliner.  Assisted out of recliner to amb to bathroom than a limited distance in hallway.   General transfer comment: cues for hand placement and safety with turns.  Assisted out of recliner as well as toilet transfer.  Pt required increased time to rise and freq rest breaks due to fatigue.  General Gait Details: used a rollator walker this session as this is what pt uses at home.  Limited amb distance due to weakness/fatigue.  Present with poor forward flex posture and "bad knees" stated pt.  HIGH FALL RISK Pt will need ST Rehab at SNF to regain prior level of mobility.   Follow Up Recommendations  SNF     Equipment Recommendations       Recommendations for Other Services       Precautions / Restrictions Precautions Precautions: Fall Precaution Comments: "bad knees" Restrictions Weight Bearing Restrictions: No    Mobility  Bed Mobility               General bed mobility comments: pt OOB in recliner   Transfers Overall transfer level: Needs assistance Equipment used: 4-wheeled walker Transfers: Sit to/from Stand;Stand Pivot Transfers Sit to Stand: Min guard;Min assist Stand pivot transfers: Min guard;Min assist       General transfer comment: cues for hand placement and safety with turns.  Assisted out of recliner as well as toilet transfer.  Pt required increased time to rise and freq rest breaks due to fatigue.    Ambulation/Gait Ambulation/Gait assistance: Min Chemical engineer (Feet): 28  Feet Assistive device: 4-wheeled walker Gait Pattern/deviations: Step-to pattern;Step-through pattern;Drifts right/left;Trunk flexed Gait velocity: decreased    General Gait Details: used a rollator walker this session as this is what pt uses at home.  Limited amb distance due to weakness/fatigue.  Present with poor forward flex posture and "bad knees" stated pt.     Stairs             Wheelchair Mobility    Modified Rankin (Stroke Patients Only)       Balance                                            Cognition Arousal/Alertness: Awake/alert   Overall Cognitive Status: No family/caregiver present to determine baseline cognitive functioning                                 General Comments: pleasant      Exercises      General Comments        Pertinent Vitals/Pain Pain Assessment: Faces Faces Pain Scale: Hurts a little bit Pain Location: back Pain Descriptors / Indicators: Grimacing Pain Intervention(s): Monitored during session    Home Living  Prior Function            PT Goals (current goals can now be found in the care plan section)      Frequency    Min 2X/week      PT Plan Current plan remains appropriate    Co-evaluation              AM-PAC PT "6 Clicks" Mobility   Outcome Measure  Help needed turning from your back to your side while in a flat bed without using bedrails?: A Lot Help needed moving from lying on your back to sitting on the side of a flat bed without using bedrails?: A Lot Help needed moving to and from a bed to a chair (including a wheelchair)?: A Lot Help needed standing up from a chair using your arms (e.g., wheelchair or bedside chair)?: A Lot Help needed to walk in hospital room?: A Lot Help needed climbing 3-5 steps with a railing? : Total 6 Click Score: 11    End of Session Equipment Utilized During Treatment: Gait belt Activity Tolerance:  Patient limited by fatigue Patient left: in chair;with call bell/phone within reach Nurse Communication: Mobility status PT Visit Diagnosis: Unsteadiness on feet (R26.81);Muscle weakness (generalized) (M62.81);Difficulty in walking, not elsewhere classified (R26.2)     Time: 7510-2585 PT Time Calculation (min) (ACUTE ONLY): 25 min  Charges:  $Gait Training: 8-22 mins $Therapeutic Activity: 8-22 mins                     Felecia Shelling  PTA Acute  Rehabilitation Services Pager      [phone] Office      [phone]

## 2018-10-24 NOTE — Progress Notes (Signed)
[facility] PROGRESS NOTE  [name] [name] ZDG:[id number] DOB: October 15, 1930 DOA: 10/21/2018 PCP: Helane Rima, DO  Brief summary   84 year old with past medical history relevant for rheumatoid arthritis, hypertension, hyperlipidemia, coronary artery disease (catheterization on 06/01/2018 with nonobstructive disease), cardiomyopathy with most recent echo on 08/30/2018 Showing resolution with grade 1 diastolic dysfunction, gout, type 2 diabetes on oral hypoglycemics, stage III CKD, pulmonary embolism on rivaroxaban, CVA, iron deficiency anemia/hypoproliferative anemia who presented with acute low back and lower extremity pain and weakness.  Assessment/Plan:  Acute low back pain/lower extremity pain: DJD. Patient presented with nonspecific lower extremity pain and acute low back pain.  She did require IV pain medications for this.  She was evaluated by physical therapy and recommended skilled nursing facility as well as a rolling walker which she was using. MRI done in the emergency department of her lumbar spine showed no evidence of acute cord compression but did show severe degenerative joint disease.  She did not have any red flag signs including no weight loss, fevers, loss of tone of her bladder or bowel. Cont outpatient follow up with ortho   Non-obstructive coronary artery disease/hypertension/hyperlipidemia: Patient was continued on home carvedilol, isosorbide mononitrate, hydralazine, spironolactone, aspirin, sacubitril/valsartan.  Stage III CKD: This was stable.  Chronic diastolic grade 1 heart failure: Home diuretics were held while she was given gentle IV fluids as she was felt to be mildly dehydrated. She may restart her home diuretics.  Hypoproliferative anemia: Patient was continued on home iron supplementation.  History of PE: Patient was continued on home rivaroxaban.  rheumatoid arthritis: Patient was continued on home hydroxychloroquine, leflunomide  prednisone.  Type 2 diabetes: Patient was maintained on sliding scale here. She may restart her home metformin on discharge.  Review PDMP. Patient will need prescription to go to NH Code Status: full Family Communication: d/w patient, RN (indicate person spoken with, relationship, and if by phone, the number) Disposition Plan: awaiting SNF   Consultants:  none  Procedures:  none  Antibiotics:  none (indicate start date, and stop date if known)  HPI/Subjective: Reports feeling better, morning stiffness.  Objective: Vitals:   10/24/18 0011 10/24/18 0604  BP: (!) 146/81 131/80  Pulse: 91 88  Resp: 20 17  Temp: 98.6 F (37 C) 98 F (36.7 C)  SpO2: 100% 97%    Intake/Output Summary (Last 24 hours) at 10/24/2018 0948 Last data filed at 10/24/2018 5956 Gross per 24 hour  Intake 600 ml  Output 1000 ml  Net -400 ml   Filed Weights   10/21/18 2048  Weight: 79.3 kg    Exam:   General:  No acute distress   Cardiovascular: s1,s2 rrr  Respiratory: CTA BL  Abdomen: soft, tn   Musculoskeletal: no leg edema    Data Reviewed: Basic Metabolic Panel: Recent Labs  Lab 10/21/18 0954 10/22/18 0344  NA 136 137  K 3.3* 3.6  CL 104 104  CO2 22 24  GLUCOSE 96 177*  BUN 18 17  CREATININE 1.01* 0.86  CALCIUM 8.4* 8.6*  MG  --  1.9   Liver Function Tests: Recent Labs  Lab 10/21/18 0954  AST 20  ALT 11  ALKPHOS 41  BILITOT 1.0  PROT 5.9*  ALBUMIN 3.3*   No results for input(s): LIPASE, AMYLASE in the last 168 hours. No results for input(s): AMMONIA in the last 168 hours. CBC: Recent Labs  Lab 10/21/18 0954 10/21/18 2140 10/22/18 0344  WBC 7.7  --  6.0  NEUTROABS 4.7  --   --  HGB 9.8*  --  10.6*  HCT 31.4* 32.0* 33.9*  MCV 99.4  --  100.0  PLT 247  --  256   Cardiac Enzymes: No results for input(s): CKTOTAL, CKMB, CKMBINDEX, TROPONINI in the last 168 hours. BNP (last 3 results) Recent Labs    05/28/18 1614 06/22/18 2153 07/23/18 2100   BNP 2,077.6* 1,146.7* 138.3*    ProBNP (last 3 results) No results for input(s): PROBNP in the last 8760 hours.  CBG: No results for input(s): GLUCAP in the last 168 hours.  No results found for this or any previous visit (from the past 240 hour(s)).   Studies: No results found.  Scheduled Meds: . allopurinol  100 mg Oral QHS  . carvedilol  6.25 mg Oral BID WC  . feeding supplement (ENSURE ENLIVE)  237 mL Oral BID BM  . furosemide  20 mg Oral Daily  . gabapentin  100 mg Oral QHS  . hydrALAZINE  100 mg Oral TID  . hydroxychloroquine  200 mg Oral BID  . isosorbide mononitrate  30 mg Oral QHS  . leflunomide  20 mg Oral Daily  . olopatadine  1 drop Both Eyes BID  . potassium chloride SA  20 mEq Oral BID  . predniSONE  10 mg Oral Q breakfast  . rivaroxaban  20 mg Oral Q supper  . sacubitril-valsartan  1 tablet Oral Q1200  . sodium chloride flush  3 mL Intravenous Q12H  . spironolactone  12.5 mg Oral Daily  . traMADol  50 mg Oral Q12H   Continuous Infusions: . sodium chloride      Principal Problem:   Acute pain Active Problems:   Type 2 diabetes mellitus with hyperlipidemia (HCC), patient declines statin   Hypertension associated with diabetes (HCC)   Rheumatoid arthritis involving multiple sites (HCC)   Anemia of chronic disease   CKD stage 3 due to type 2 diabetes mellitus (HCC)   High risk medications, long-term use   Hypokalemia   Pulmonary embolism (HCC), 07/2018, on Xarelto with goal to DC after 6 months if more active    Time spent: >35 minutes     [name] [name]  [facility] Pager [phone]. If 7PM-7AM, please contact night-coverage at [email or web address], password [facility] 10/24/2018, 9:48 AM  LOS: 0 days

## 2018-10-24 NOTE — Plan of Care (Signed)
  Problem: Education: Goal: Knowledge of General Education information will improve Description: Including pain rating scale, medication(s)/side effects and non-pharmacologic comfort measures Outcome: Progressing   Problem: Health Behavior/Discharge Planning: Goal: Ability to manage health-related needs will improve Outcome: Progressing   Problem: Clinical Measurements: Goal: Ability to maintain clinical measurements within normal limits will improve Outcome: Progressing Goal: Will remain free from infection Outcome: Progressing Goal: Diagnostic test results will improve Outcome: Progressing Goal: Respiratory complications will improve Outcome: Progressing Goal: Cardiovascular complication will be avoided Outcome: Progressing   Problem: Activity: Goal: Risk for activity intolerance will decrease Outcome: Progressing   Problem: Nutrition: Goal: Adequate nutrition will be maintained Outcome: Progressing   Problem: Coping: Goal: Level of anxiety will decrease Outcome: Progressing   Problem: Elimination: Goal: Will not experience complications related to bowel motility Outcome: Progressing   Problem: Safety: Goal: Ability to remain free from injury will improve Outcome: Progressing   Problem: Skin Integrity: Goal: Risk for impaired skin integrity will decrease Outcome: Progressing   

## 2018-10-24 NOTE — Care Management Obs Status (Signed)
MEDICARE OBSERVATION STATUS NOTIFICATION   Patient Details  Name: MYAH GUYNES MRN: [id number] Date of Birth: 08-25-1930   Medicare Observation Status Notification Given:  Yes    Alexis Goodell, RN 10/24/2018, 9:29 AM

## 2018-10-25 ENCOUNTER — Other Ambulatory Visit: Payer: Self-pay | Admitting: Internal Medicine

## 2018-10-25 DIAGNOSIS — H1013 Acute atopic conjunctivitis, bilateral: Secondary | ICD-10-CM | POA: Diagnosis not present

## 2018-10-25 DIAGNOSIS — I429 Cardiomyopathy, unspecified: Secondary | ICD-10-CM | POA: Diagnosis not present

## 2018-10-25 DIAGNOSIS — E876 Hypokalemia: Secondary | ICD-10-CM | POA: Diagnosis not present

## 2018-10-25 DIAGNOSIS — M0579 Rheumatoid arthritis with rheumatoid factor of multiple sites without organ or systems involvement: Secondary | ICD-10-CM

## 2018-10-25 DIAGNOSIS — I420 Dilated cardiomyopathy: Secondary | ICD-10-CM | POA: Diagnosis not present

## 2018-10-25 DIAGNOSIS — H409 Unspecified glaucoma: Secondary | ICD-10-CM | POA: Diagnosis not present

## 2018-10-25 DIAGNOSIS — M25561 Pain in right knee: Secondary | ICD-10-CM | POA: Diagnosis not present

## 2018-10-25 DIAGNOSIS — I5042 Chronic combined systolic (congestive) and diastolic (congestive) heart failure: Secondary | ICD-10-CM | POA: Diagnosis not present

## 2018-10-25 DIAGNOSIS — R569 Unspecified convulsions: Secondary | ICD-10-CM | POA: Diagnosis not present

## 2018-10-25 DIAGNOSIS — M069 Rheumatoid arthritis, unspecified: Secondary | ICD-10-CM | POA: Diagnosis not present

## 2018-10-25 DIAGNOSIS — G8929 Other chronic pain: Secondary | ICD-10-CM | POA: Diagnosis not present

## 2018-10-25 DIAGNOSIS — I69398 Other sequelae of cerebral infarction: Secondary | ICD-10-CM | POA: Diagnosis not present

## 2018-10-25 DIAGNOSIS — E1169 Type 2 diabetes mellitus with other specified complication: Secondary | ICD-10-CM | POA: Diagnosis not present

## 2018-10-25 DIAGNOSIS — S81801D Unspecified open wound, right lower leg, subsequent encounter: Secondary | ICD-10-CM | POA: Diagnosis not present

## 2018-10-25 DIAGNOSIS — I2609 Other pulmonary embolism with acute cor pulmonale: Secondary | ICD-10-CM | POA: Diagnosis not present

## 2018-10-25 DIAGNOSIS — M6281 Muscle weakness (generalized): Secondary | ICD-10-CM | POA: Diagnosis not present

## 2018-10-25 DIAGNOSIS — M48061 Spinal stenosis, lumbar region without neurogenic claudication: Secondary | ICD-10-CM | POA: Diagnosis not present

## 2018-10-25 DIAGNOSIS — I130 Hypertensive heart and chronic kidney disease with heart failure and stage 1 through stage 4 chronic kidney disease, or unspecified chronic kidney disease: Secondary | ICD-10-CM | POA: Diagnosis not present

## 2018-10-25 DIAGNOSIS — M5136 Other intervertebral disc degeneration, lumbar region: Secondary | ICD-10-CM | POA: Diagnosis not present

## 2018-10-25 DIAGNOSIS — M797 Fibromyalgia: Secondary | ICD-10-CM | POA: Diagnosis not present

## 2018-10-25 DIAGNOSIS — Z86711 Personal history of pulmonary embolism: Secondary | ICD-10-CM | POA: Diagnosis not present

## 2018-10-25 DIAGNOSIS — D619 Aplastic anemia, unspecified: Secondary | ICD-10-CM | POA: Diagnosis not present

## 2018-10-25 DIAGNOSIS — E860 Dehydration: Secondary | ICD-10-CM | POA: Diagnosis not present

## 2018-10-25 DIAGNOSIS — M47816 Spondylosis without myelopathy or radiculopathy, lumbar region: Secondary | ICD-10-CM | POA: Diagnosis not present

## 2018-10-25 DIAGNOSIS — M1A09X1 Idiopathic chronic gout, multiple sites, with tophus (tophi): Secondary | ICD-10-CM | POA: Diagnosis not present

## 2018-10-25 DIAGNOSIS — Z885 Allergy status to narcotic agent status: Secondary | ICD-10-CM | POA: Diagnosis not present

## 2018-10-25 DIAGNOSIS — R2681 Unsteadiness on feet: Secondary | ICD-10-CM | POA: Diagnosis not present

## 2018-10-25 DIAGNOSIS — D638 Anemia in other chronic diseases classified elsewhere: Secondary | ICD-10-CM | POA: Diagnosis not present

## 2018-10-25 DIAGNOSIS — N183 Chronic kidney disease, stage 3 (moderate): Secondary | ICD-10-CM | POA: Diagnosis not present

## 2018-10-25 DIAGNOSIS — Z8673 Personal history of transient ischemic attack (TIA), and cerebral infarction without residual deficits: Secondary | ICD-10-CM | POA: Diagnosis not present

## 2018-10-25 DIAGNOSIS — I252 Old myocardial infarction: Secondary | ICD-10-CM | POA: Diagnosis not present

## 2018-10-25 DIAGNOSIS — E1159 Type 2 diabetes mellitus with other circulatory complications: Secondary | ICD-10-CM | POA: Diagnosis not present

## 2018-10-25 DIAGNOSIS — M4316 Spondylolisthesis, lumbar region: Secondary | ICD-10-CM | POA: Diagnosis not present

## 2018-10-25 DIAGNOSIS — J810 Acute pulmonary edema: Secondary | ICD-10-CM | POA: Diagnosis not present

## 2018-10-25 DIAGNOSIS — I2699 Other pulmonary embolism without acute cor pulmonale: Secondary | ICD-10-CM | POA: Diagnosis not present

## 2018-10-25 DIAGNOSIS — Z7901 Long term (current) use of anticoagulants: Secondary | ICD-10-CM | POA: Diagnosis not present

## 2018-10-25 DIAGNOSIS — M0609 Rheumatoid arthritis without rheumatoid factor, multiple sites: Secondary | ICD-10-CM | POA: Diagnosis not present

## 2018-10-25 DIAGNOSIS — I160 Hypertensive urgency: Secondary | ICD-10-CM | POA: Diagnosis not present

## 2018-10-25 DIAGNOSIS — M545 Low back pain: Secondary | ICD-10-CM | POA: Diagnosis not present

## 2018-10-25 DIAGNOSIS — I2510 Atherosclerotic heart disease of native coronary artery without angina pectoris: Secondary | ICD-10-CM | POA: Diagnosis not present

## 2018-10-25 DIAGNOSIS — D631 Anemia in chronic kidney disease: Secondary | ICD-10-CM | POA: Diagnosis not present

## 2018-10-25 DIAGNOSIS — K819 Cholecystitis, unspecified: Secondary | ICD-10-CM | POA: Diagnosis not present

## 2018-10-25 DIAGNOSIS — R52 Pain, unspecified: Secondary | ICD-10-CM | POA: Diagnosis not present

## 2018-10-25 DIAGNOSIS — E1122 Type 2 diabetes mellitus with diabetic chronic kidney disease: Secondary | ICD-10-CM | POA: Diagnosis not present

## 2018-10-25 DIAGNOSIS — E785 Hyperlipidemia, unspecified: Secondary | ICD-10-CM

## 2018-10-25 DIAGNOSIS — I152 Hypertension secondary to endocrine disorders: Secondary | ICD-10-CM | POA: Diagnosis not present

## 2018-10-25 DIAGNOSIS — I69828 Other speech and language deficits following other cerebrovascular disease: Secondary | ICD-10-CM | POA: Diagnosis not present

## 2018-10-25 MED ORDER — TRAMADOL HCL 50 MG PO TABS: 50.0000 mg | ORAL_TABLET | Freq: Four times a day (QID) | ORAL | 0 refills | Status: DC | PRN

## 2018-10-25 MED ORDER — TRAMADOL HCL 50 MG PO TABS: 50.0000 mg | ORAL_TABLET | Freq: Two times a day (BID) | ORAL | Status: DC | PRN

## 2018-10-25 NOTE — Progress Notes (Signed)
[facility] PROGRESS NOTE  [name] [name] OAC:[id number] DOB: 1930/10/31 DOA: 10/21/2018 PCP: Helane Rima, DO  Brief summary   84 year old with past medical history relevant for rheumatoid arthritis, hypertension, hyperlipidemia, coronary artery disease (catheterization on 06/01/2018 with nonobstructive disease), cardiomyopathy with most recent echo on 08/30/2018 Showing resolution with grade 1 diastolic dysfunction, gout, type 2 diabetes on oral hypoglycemics, stage III CKD, pulmonary embolism on rivaroxaban, CVA, iron deficiency anemia/hypoproliferative anemia who presented with acute low back and lower extremity pain and weakness.  Assessment/Plan:  Acute low back pain/lower extremity pain: DJD. Patient presented with nonspecific lower extremity pain and acute low back pain.  She did require IV pain medications for this.  She was evaluated by physical therapy and recommended skilled nursing facility as well as a rolling walker which she was using. MRI done in the emergency department of her lumbar spine showed no evidence of acute cord compression but did show severe degenerative joint disease.  She did not have any red flag signs including no weight loss, fevers, loss of tone of her bladder or bowel. Cont outpatient follow up with ortho   Non-obstructive coronary artery disease/hypertension/hyperlipidemia: Patient was continued on home carvedilol, isosorbide mononitrate, hydralazine, spironolactone, aspirin, sacubitril/valsartan.  Stage III CKD: This was stable.  Chronic diastolic grade 1 heart failure: Home diuretics were held while she was given gentle IV fluids as she was felt to be mildly dehydrated. She may restart her home diuretics.  Hypoproliferative anemia: Patient was continued on home iron supplementation.  History of PE: Patient was continued on home rivaroxaban.  Rheumatoid arthritis: Patient was continued on home hydroxychloroquine, leflunomide  prednisone.  Type 2 diabetes: Patient was maintained on sliding scale here. She may restart her home metformin on discharge.  Review PDMP. Patient will need prescription to go to NH Code Status: full Family Communication: d/w patient, RN, updated her daughter (indicate person spoken with, relationship, and if by phone, the number) Disposition Plan: awaiting SNF   Consultants:  none  Procedures:  none  Antibiotics:  none (indicate start date, and stop date if known)  HPI/Subjective: Reports feeling better, but still have arthrotic pains. morning stiffness.  Objective: Vitals:   10/24/18 2223 10/25/18 0540  BP: (!) 159/82 (!) 142/82  Pulse: 85 93  Resp: 19 18  Temp: 97.7 F (36.5 C) 97.9 F (36.6 C)  SpO2: 99% 99%    Intake/Output Summary (Last 24 hours) at 10/25/2018 1012 Last data filed at 10/25/2018 0906 Gross per 24 hour  Intake 563 ml  Output -  Net 563 ml   Filed Weights   10/21/18 2048  Weight: 79.3 kg    Exam:   General:  No acute distress   Cardiovascular: s1,s2 rrr  Respiratory: CTA BL  Abdomen: soft, tn   Musculoskeletal: no leg edema    Data Reviewed: Basic Metabolic Panel: Recent Labs  Lab 10/21/18 0954 10/22/18 0344  NA 136 137  K 3.3* 3.6  CL 104 104  CO2 22 24  GLUCOSE 96 177*  BUN 18 17  CREATININE 1.01* 0.86  CALCIUM 8.4* 8.6*  MG  --  1.9   Liver Function Tests: Recent Labs  Lab 10/21/18 0954  AST 20  ALT 11  ALKPHOS 41  BILITOT 1.0  PROT 5.9*  ALBUMIN 3.3*   No results for input(s): LIPASE, AMYLASE in the last 168 hours. No results for input(s): AMMONIA in the last 168 hours. CBC: Recent Labs  Lab 10/21/18 0954 10/21/18 2140 10/22/18 0344  WBC  7.7  --  6.0  NEUTROABS 4.7  --   --   HGB 9.8*  --  10.6*  HCT 31.4* 32.0* 33.9*  MCV 99.4  --  100.0  PLT 247  --  256   Cardiac Enzymes: No results for input(s): CKTOTAL, CKMB, CKMBINDEX, TROPONINI in the last 168 hours. BNP (last 3 results) Recent  Labs    05/28/18 1614 06/22/18 2153 07/23/18 2100  BNP 2,077.6* 1,146.7* 138.3*    ProBNP (last 3 results) No results for input(s): PROBNP in the last 8760 hours.  CBG: No results for input(s): GLUCAP in the last 168 hours.  No results found for this or any previous visit (from the past 240 hour(s)).   Studies: No results found.  Scheduled Meds: . allopurinol  100 mg Oral QHS  . carvedilol  6.25 mg Oral BID WC  . feeding supplement (ENSURE ENLIVE)  237 mL Oral BID BM  . furosemide  20 mg Oral Daily  . gabapentin  100 mg Oral QHS  . hydrALAZINE  100 mg Oral TID  . hydroxychloroquine  200 mg Oral BID  . isosorbide mononitrate  30 mg Oral QHS  . leflunomide  20 mg Oral Daily  . olopatadine  1 drop Both Eyes BID  . potassium chloride SA  20 mEq Oral BID  . predniSONE  10 mg Oral Q breakfast  . rivaroxaban  20 mg Oral Q supper  . sacubitril-valsartan  1 tablet Oral Q1200  . sodium chloride flush  3 mL Intravenous Q12H  . spironolactone  12.5 mg Oral Daily  . traMADol  50 mg Oral Q12H   Continuous Infusions: . sodium chloride      Principal Problem:   Acute pain Active Problems:   Type 2 diabetes mellitus with hyperlipidemia (HCC), patient declines statin   Hypertension associated with diabetes (HCC)   Rheumatoid arthritis involving multiple sites (HCC)   Anemia of chronic disease   CKD stage 3 due to type 2 diabetes mellitus (HCC)   High risk medications, long-term use   Hypokalemia   Pulmonary embolism (HCC), 07/2018, on Xarelto with goal to DC after 6 months if more active    Time spent: >35 minutes     Esperanza Sheets  [facility] Pager [phone]. If 7PM-7AM, please contact night-coverage at [email or web address], password [facility] 10/25/2018, 10:12 AM  LOS: 0 days

## 2018-10-25 NOTE — Progress Notes (Signed)
Occupational Therapy Treatment Patient Details Name: [name] [name] MRN: [id number] DOB: September 16, 1930 Today's Date: 10/25/2018    History of present illness 84 yo female was admitted with back and BLE's radiating pain without focal weakness or numbness on legs.  Had recent hospitalization for pancreatitis, but no notes of back pain previously.  Current onset was 2 days before admission.  PMHx:  CHF, CAD, MI, PE, Sz, CVA, DM, RA, gout.   OT comments  Pt plans to go to rehab/SNF prior to DC home.  Pt overall min A with ADL activity at this time.    Follow Up Recommendations  SNF    Equipment Recommendations  Other (comment)(defer to next venue)    Recommendations for Other Services      Precautions / Restrictions Precautions Precautions: Fall Precaution Comments: "bad knees" Restrictions Weight Bearing Restrictions: No       Mobility Bed Mobility               General bed mobility comments: pt OOB in recliner   Transfers Overall transfer level: Needs assistance Equipment used: Rolling walker (2 wheeled) Transfers: Sit to/from [employer] Sit to Stand: Min guard Stand pivot transfers: Min guard       General transfer comment: VC for hand placement    Balance Overall balance assessment: Needs assistance Sitting-balance support: Feet supported Sitting balance-Leahy Scale: Fair     Standing balance support: Bilateral upper extremity supported;During functional activity Standing balance-Leahy Scale: Poor                             ADL either performed or assessed with clinical judgement   ADL Overall ADL's : Needs assistance/impaired     Grooming: Standing;Wash/dry face;Oral care;Wash/dry hands;Min guard   Upper Body Bathing: Set up;Sitting   Lower Body Bathing: Sit to/from stand;Minimal assistance   Upper Body Dressing : Minimal assistance;Sitting   Lower Body Dressing: Sit to/from stand;Minimal assistance                  General ADL Comments: Pt plans to go to rehab to increase I with ADL activity      Vision Baseline Vision/History: Wears glasses Wears Glasses: At all times Patient Visual Report: No change from baseline     Perception     Praxis      Cognition Arousal/Alertness: Awake/alert Behavior During Therapy: WFL for tasks assessed/performed Overall Cognitive Status: Within Functional Limits for tasks assessed                                                     Pertinent Vitals/ Pain       Pain Assessment: No/denies pain     Prior Functioning/Environment              Frequency  Min 2X/week        Progress Toward Goals  OT Goals(current goals can now be found in the care plan section)  Progress towards OT goals: Progressing toward goals     Plan Discharge plan remains appropriate    Co-evaluation                 AM-PAC OT "6 Clicks" Daily Activity     Outcome Measure   Help from another person eating meals?: None Help from  another person taking care of personal grooming?: A Little Help from another person toileting, which includes using toliet, bedpan, or urinal?: A Little Help from another person bathing (including washing, rinsing, drying)?: A Little Help from another person to put on and taking off regular upper body clothing?: None Help from another person to put on and taking off regular lower body clothing?: A Little 6 Click Score: 20    End of Session Equipment Utilized During Treatment: Gait belt;Rolling walker  OT Visit Diagnosis: Other abnormalities of gait and mobility (R26.89);Muscle weakness (generalized) (M62.81)   Activity Tolerance Patient tolerated treatment well   Patient Left with call bell/phone within reach;in chair   Nurse Communication Mobility status        Time: 0940-1001 OT Time Calculation (min): 21 min  Charges: OT General Charges $OT Visit: 1 Visit OT Treatments $Self Care/Home  Management : 8-22 mins  [name] [name], OT Acute Rehabilitation Services [phone] Office- [phone]      [name] [name], [name] [name] 10/25/2018, 10:16 AM

## 2018-10-25 NOTE — Discharge Summary (Signed)
Physician Discharge Summary  [name] [name] GGY:[id number] DOB: [birth year] DOA: 10/21/2018  PCP: Helane Rima, DO  Admit date: 10/21/2018 Discharge date: 10/25/2018  Time spent: >35 minutes  Recommendations for Outpatient Follow-up:  MD at the facility  Orthopedics in 2-3 weeks  Discharge Diagnoses:  Principal Problem:   Acute pain Active Problems:   Type 2 diabetes mellitus with hyperlipidemia (HCC), patient declines statin   Hypertension associated with diabetes (HCC)   Rheumatoid arthritis involving multiple sites (HCC)   Anemia of chronic disease   CKD stage 3 due to type 2 diabetes mellitus (HCC)   High risk medications, long-term use   Hypokalemia   Pulmonary embolism (HCC), 07/2018, on Xarelto with goal to DC after 6 months if more active   Discharge Condition: stable   Diet recommendation: low sodium   Filed Weights   10/21/18 2048  Weight: 79.3 kg    History of present illness:   84 year old with past medical history relevant for rheumatoid arthritis, hypertension, hyperlipidemia, coronary artery disease (catheterization on 06/01/2018 with nonobstructive disease), cardiomyopathy with most recent echo on 08/30/2018 Showing resolution with grade 1 diastolic dysfunction, gout, type 2 diabetes on oral hypoglycemics, stage III CKD, pulmonary embolism on rivaroxaban, CVA, iron deficiency anemia/hypoproliferative anemia who presented withacute low back and lower extremity pain and weakness.  Hospital Course:   Acute low back pain/lower extremity pain: DJD. Patient presented with nonspecific lower extremity pain and acute low back pain. She did require IV pain medications for this. She was evaluated by physical therapy and recommendedskilled nursing facilityas well as a rolling walker which she was using.MRI done in the emergency department of her lumbar spine showed no evidence of acute cord compression but did show severe degenerative joint disease. She did not  have any red flag signs including no weight loss, fevers, loss of tone of her bladder or bowel. Cont outpatient follow up with ortho   Non-obstructive coronary artery disease/hypertension/hyperlipidemia: Patient was continued on home carvedilol, isosorbide mononitrate, hydralazine, spironolactone, aspirin, sacubitril/valsartan.  Stage III CKD: This was stable.  Chronic diastolic grade 1 heart failure: cont home diuretics.  Hypoproliferative anemia: Patient was continued on home iron supplementation.  History of PE: Patient was continued on home rivaroxaban.  Rheumatoid arthritis: Patient was continued on home hydroxychloroquine, leflunomide prednisone.  Type 2 diabetes:  home metformin .  Review PDMP. Patient will need prescription to go to NH  Procedures:  none (i.e. Studies not automatically included, echos, thoracentesis, etc; not x-rays)  Consultations:  none  Discharge Exam: Vitals:   10/24/18 2223 10/25/18 0540  BP: (!) 159/82 (!) 142/82  Pulse: 85 93  Resp: 19 18  Temp: 97.7 F (36.5 C) 97.9 F (36.6 C)  SpO2: 99% 99%    General: no distress  Cardiovascular: s1,s2 rrr Respiratory: CTA BL  Discharge Instructions  Discharge Instructions    Diet - low sodium heart healthy   Complete by:  As directed    Increase activity slowly   Complete by:  As directed      Allergies as of 10/25/2018      Reactions   Clonidine Derivatives Swelling   [name]   Fish Allergy Diarrhea, Swelling, Other (See Comments)   Turns skin "black," but can tolerate white fish Salmon- Diarrhea   Shellfish Allergy Hives   Doxycycline Rash   Indomethacin Other (See Comments)   Reaction not recalled by the patient   Lyrica [pregabalin] Other (See Comments)  Hallucinations   Methyldopa Other (See Comments)   Aldomet (for hypertension): Reaction not recalled by the patient   Morphine And Related Other  (See Comments)   Family reports it drops her bp that she needs iv fluids   Orange Fruit [citrus] Other (See Comments)   Indigestion/heartburn   Strawberry (diagnostic) Itching   Cetirizine Hcl Itching, Rash   Codeine Itching   Levaquin [levofloxacin In D5w] Rash   Tomato Rash      Medication List    STOP taking these medications   spironolactone 25 MG tablet Commonly known as:  ALDACTONE     TAKE these medications   albuterol 108 (90 Base) MCG/ACT inhaler Commonly known as:  PROVENTIL HFA;VENTOLIN HFA Inhale 1-2 puffs into the lungs every 6 (six) hours as needed for wheezing or shortness of breath.   allopurinol 100 MG tablet Commonly known as:  ZYLOPRIM Take 100 mg by mouth at bedtime.   carvedilol 6.25 MG tablet Commonly known as:  COREG Take 1 tablet (6.25 mg total) by mouth 2 (two) times daily with a meal.   D3-1000 25 MCG (1000 UT) capsule Generic drug:  Cholecalciferol Take 1,000 Units by mouth daily.   diclofenac sodium 1 % Gel Commonly known as:  VOLTAREN Apply 1 application topically daily. What changed:    when to take this  reasons to take this  additional instructions   ENSURE Take 237 mLs by mouth every evening.   ENTRESTO 49-51 MG Generic drug:  sacubitril-valsartan Take 1 tablet by mouth daily at 12 noon.   furosemide 40 MG tablet Commonly known as:  LASIX Take 0.5 tablets (20 mg total) by mouth daily.   gabapentin 100 MG capsule Commonly known as:  NEURONTIN Take 1 capsule (100 mg total) by mouth at bedtime.   hydrALAZINE 100 MG tablet Commonly known as:  APRESOLINE Take 100 mg by mouth 3 (three) times daily.   hydroxychloroquine 200 MG tablet Commonly known as:  PLAQUENIL Take 200 mg by mouth 2 (two) times daily.   hydroxypropyl methylcellulose / hypromellose 2.5 % ophthalmic solution Commonly known as:  ISOPTO TEARS / GONIOVISC Place 1 drop into both eyes daily.   isosorbide mononitrate 30 MG 24 hr tablet Commonly known as:   IMDUR Take 1 tablet (30 mg total) by mouth at bedtime.   leflunomide 20 MG tablet Commonly known as:  ARAVA Take 20 mg by mouth daily.   Olopatadine HCl 0.2 % Soln Place 1 drop into both eyes daily.   potassium chloride SA 20 MEQ tablet Commonly known as:  K-DUR,KLOR-CON Take 1 tablet (20 mEq total) by mouth 2 (two) times daily.   predniSONE 10 MG tablet Commonly known as:  DELTASONE TAKE 1 TABLET DAILY What changed:  when to take this   rivaroxaban 20 MG Tabs tablet Commonly known as:  XARELTO Take 20 mg by mouth daily with supper.   traMADol 50 MG tablet Commonly known as:  ULTRAM Take 1 tablet (50 mg total) by mouth every 6 (six) hours as needed for up to 5 days for moderate pain or severe pain.   triamcinolone cream 0.1 % Commonly known as:  KENALOG Apply 1 application topically 2 (two) times daily as needed (for itching- to affected sites).   vitamin C 500 MG tablet Commonly known as:  ASCORBIC ACID Take 500 mg by mouth daily.      Allergies  Allergen Reactions  . Clonidine Derivatives Swelling    [name] reports patient's tongue was swollen  and patient hallucinated  . Fish Allergy Diarrhea, Swelling and Other (See Comments)    Turns skin "black," but can tolerate white fish Salmon- Diarrhea  . Shellfish Allergy Hives  . Doxycycline Rash  . Indomethacin Other (See Comments)    Reaction not recalled by the patient  . Lyrica [Pregabalin] Other (See Comments)    Hallucinations   . Methyldopa Other (See Comments)    Aldomet (for hypertension): Reaction not recalled by the patient  . Morphine And Related Other (See Comments)    Family reports it drops her bp that she needs iv fluids  . Orange Fruit [Citrus] Other (See Comments)    Indigestion/heartburn  . Strawberry (Diagnostic) Itching  . Cetirizine Hcl Itching and Rash  . Codeine Itching  . Levaquin [Levofloxacin In D5w] Rash  . Tomato Rash      The results of significant diagnostics from  this hospitalization (including imaging, microbiology, ancillary and laboratory) are listed below for reference.    Significant Diagnostic Studies: Dg Lumbar Spine Complete  Result Date: 10/21/2018 CLINICAL DATA:  Bilateral hip and leg pain EXAM: LUMBAR SPINE - COMPLETE 4+ VIEW COMPARISON:  Abdominal CT 09/11/2015 FINDINGS: Transitional S1 vertebra with rudimentary S1-2 disc space. Remote L1 superior endplate fracture based on prior. Height loss is mild. No acute fracture is seen. Advanced diffuse degenerative disc narrowing. Diffuse advanced facet arthropathy with spurring and anterolisthesis at L4-5 and L5-S1. There is milder retrolisthesis at L2-3. IMPRESSION: 1. No acute finding. 2. Remote L1 superior endplate fracture. 3. Advanced degenerative disease with multilevel listhesis. Electronically Signed   By: Marnee Spring [name]   On: 10/21/2018 10:30   Mr Lumbar Spine Wo Contrast  Result Date: 10/21/2018 CLINICAL DATA:  84 year old female with severe pain radiating to the hips and legs, worse on the right. EXAM: MRI LUMBAR SPINE WITHOUT CONTRAST TECHNIQUE: Multiplanar, multisequence MR imaging of the lumbar spine was performed. No intravenous contrast was administered. COMPARISON:  Lumbar radiographs 1014 hours today. Abdomen MRI 06/23/2018, and earlier. FINDINGS: Segmentation: Nearly full size S1-S2 disc space, but otherwise normal segmentation with 5 lumbar type vertebral bodies and sacralized S1 level. Same numbering as on the plain films today. Alignment: Mild levoconvex lumbar scoliosis. Chronic grade 1 anterolisthesis at L4-L5 and L5-S1 is stable to mildly increased since 2016. Chronic mild retrolisthesis of L2 on L3 is stable. Vertebrae: No marrow edema or evidence of acute osseous abnormality. Chronic L1 compression fracture. Degenerative endplate signal changes in the lumbar spine. Intact visible sacrum and SI joints. Conus medullaris and cauda equina: Conus extends to the L1-L2 level. No  lower spinal cord or conus signal abnormality. Paraspinal and other soft tissues: Stable visible abdomen. Negative paraspinal soft tissues aside from posterior subcutaneous edema. Disc levels: No lower thoracic spinal stenosis. T12-L1: Mild grade 1 anterolisthesis with mild to moderate facet and ligament flavum hypertrophy. Mild disc bulge. No spinal stenosis. Mild left and moderate right T12 foraminal stenosis. L1-L2:  Mild to moderate facet hypertrophy.  No stenosis. L2-L3: Chronic disc space loss and mild retrolisthesis. Circumferential disc bulge with mild to moderate facet and ligament flavum hypertrophy. Mild spinal stenosis. Mild left and moderate right L2 foraminal stenosis. L3-L4: Chronic disc space loss. Vacuum disc here in 2016. Circumferential but mostly far lateral disc bulging and endplate spurring. Moderate facet and ligament flavum hypertrophy. Mild spinal and right lateral recess stenosis (right L4 nerve level). Mild left and moderate right L3 foraminal stenosis. L4-L5: Chronic anterolisthesis, disc space loss and vacuum disc. Circumferential disc/pseudo  disc with severe facet and ligament flavum hypertrophy. Severe spinal stenosis with left greater than right lateral recess stenosis (L5 nerve levels). Moderate to severe bilateral L4 foraminal stenosis. L5-S1: Chronic anterolisthesis with disc space loss and vacuum disc. Circumferential disc/pseudo disc. Severe facet and ligament flavum hypertrophy. Severe spinal and bilateral lateral recess stenosis (S1 nerve levels). Moderate left and moderate to severe right L5 foraminal stenosis. S1-S2: Full size disc space, otherwise negative with no stenosis. IMPRESSION: 1. No acute osseous abnormality. Chronic lumbar spondylolisthesis. Chronic L1 compression fracture. 2. Severe multifactorial spinal stenosis at L4-L5 and L5-S1, with severe lateral recess stenosis worse at the latter. Moderate to severe bilateral L5 foraminal stenosis at both levels. 3. Mild  spinal stenosis at L2-L3 and L3-L4 with up to moderate right neural foraminal stenosis. Electronically Signed   By: Odessa Fleming [name]   On: 10/21/2018 17:32   Dg Hip Unilat W Or Wo Pelvis 2-3 Views Right  Result Date: 10/21/2018 CLINICAL DATA:  BILATERAL hip and leg pain EXAM: DG HIP (WITH OR WITHOUT PELVIS) 2-3V RIGHT COMPARISON:  Pelvic radiograph 01/24/2015, CT abdomen and pelvis 06/12/2014 FINDINGS: [place] mineralization. Narrowing of hip joints bilaterally. SI joints preserved. No acute fracture or dislocation. Bubbly well-circumscribed lytic lesion at the proximal LEFT femoral diaphysis lesion again identified, 4.7 x 2.0 cm, present and not significantly changed since at least 01/24/2015 and present on the prior CT exam from 2015 as well, generally benign in character. Scattered atherosclerotic calcifications. IMPRESSION: Degenerative changes of the hip joints bilaterally. No acute bony abnormalities. Chronic benign-appearing lytic lesion at proximal LEFT femoral diaphysis not significantly since 01/24/2015. Electronically Signed   By: Ulyses Southward [name]   On: 10/21/2018 10:37    Microbiology: No results found for this or any previous visit (from the past 240 hour(s)).   Labs: Basic Metabolic Panel: Recent Labs  Lab 10/21/18 0954 10/22/18 0344  NA 136 137  K 3.3* 3.6  CL 104 104  CO2 22 24  GLUCOSE 96 177*  BUN 18 17  CREATININE 1.01* 0.86  CALCIUM 8.4* 8.6*  MG  --  1.9   Liver Function Tests: Recent Labs  Lab 10/21/18 0954  AST 20  ALT 11  ALKPHOS 41  BILITOT 1.0  PROT 5.9*  ALBUMIN 3.3*   No results for input(s): LIPASE, AMYLASE in the last 168 hours. No results for input(s): AMMONIA in the last 168 hours. CBC: Recent Labs  Lab 10/21/18 0954 10/21/18 2140 10/22/18 0344  WBC 7.7  --  6.0  NEUTROABS 4.7  --   --   HGB 9.8*  --  10.6*  HCT 31.4* 32.0* 33.9*  MCV 99.4  --  100.0  PLT 247  --  256   Cardiac Enzymes: No results for input(s): CKTOTAL, CKMB, CKMBINDEX,  TROPONINI in the last 168 hours. BNP: BNP (last 3 results) Recent Labs    05/28/18 1614 06/22/18 2153 07/23/18 2100  BNP 2,077.6* 1,146.7* 138.3*    ProBNP (last 3 results) No results for input(s): PROBNP in the last 8760 hours.  CBG: No results for input(s): GLUCAP in the last 168 hours.     SignedEsperanza Sheets  [facility] 10/25/2018, 10:29 AM

## 2018-10-25 NOTE — Plan of Care (Signed)
  Problem: Clinical Measurements: Goal: Will remain free from infection Outcome: Progressing Goal: Diagnostic test results will improve Outcome: Progressing Goal: Respiratory complications will improve Outcome: Progressing Goal: Cardiovascular complication will be avoided Outcome: Progressing   Problem: Skin Integrity: Goal: Risk for impaired skin integrity will decrease Outcome: Progressing   

## 2018-10-25 NOTE — Progress Notes (Signed)
Report called to Leotis Shames, RN at [facility].  [facility] at bedside to transport patient.  Pt A&O, no complaints at this time.

## 2018-10-26 ENCOUNTER — Encounter: Payer: Self-pay | Admitting: Internal Medicine

## 2018-10-26 ENCOUNTER — Other Ambulatory Visit: Payer: Self-pay | Admitting: Internal Medicine

## 2018-10-26 ENCOUNTER — Non-Acute Institutional Stay (SKILLED_NURSING_FACILITY): Payer: Medicare Other | Admitting: Internal Medicine

## 2018-10-26 DIAGNOSIS — M545 Low back pain, unspecified: Secondary | ICD-10-CM

## 2018-10-26 DIAGNOSIS — I2609 Other pulmonary embolism with acute cor pulmonale: Secondary | ICD-10-CM | POA: Diagnosis not present

## 2018-10-26 DIAGNOSIS — G8929 Other chronic pain: Secondary | ICD-10-CM

## 2018-10-26 DIAGNOSIS — M25561 Pain in right knee: Secondary | ICD-10-CM

## 2018-10-26 DIAGNOSIS — E1169 Type 2 diabetes mellitus with other specified complication: Secondary | ICD-10-CM | POA: Diagnosis not present

## 2018-10-26 DIAGNOSIS — I5042 Chronic combined systolic (congestive) and diastolic (congestive) heart failure: Secondary | ICD-10-CM

## 2018-10-26 DIAGNOSIS — I2510 Atherosclerotic heart disease of native coronary artery without angina pectoris: Secondary | ICD-10-CM

## 2018-10-26 DIAGNOSIS — M25562 Pain in left knee: Secondary | ICD-10-CM

## 2018-10-26 DIAGNOSIS — M069 Rheumatoid arthritis, unspecified: Secondary | ICD-10-CM

## 2018-10-26 DIAGNOSIS — E785 Hyperlipidemia, unspecified: Secondary | ICD-10-CM

## 2018-10-26 MED ORDER — TRAMADOL HCL 50 MG PO TABS: 100.0000 mg | ORAL_TABLET | Freq: Four times a day (QID) | ORAL | 0 refills | Status: DC

## 2018-10-26 NOTE — Progress Notes (Signed)
Location:  [facility] Room Number: [phone] Place of Service:  SNF [phone])  Merrilee Seashore, MD  Patient Care Team: Helane Rima, DO as PCP - General (Family Medicine) Chilton Si, MD as PCP - Cardiology (Cardiology) Arlean Hopping, MD as Rounding Team (Internal Medicine)  Extended Emergency Contact Information Primary Emergency Contact: Hampton,Jeannette Address: [address]          [place], [place] [address] [place] of [place] Home Phone: [phone] Relation: Daughter Secondary Emergency Contact: [name] Address: [address]          [place], [place] [address] [place] of [place] Mobile Phone: [phone] Relation: Daughter    Allergies: Clonidine derivatives; Fish allergy; Shellfish allergy; Doxycycline; Indomethacin; Lyrica [pregabalin]; Methyldopa; Morphine and related; Orange fruit [citrus]; Strawberry (diagnostic); Cetirizine hcl; Codeine; Levaquin [levofloxacin in d5w]; and Tomato  Chief Complaint  Patient presents with  . New Admit To SNF    Admit to [facility]    HPI: Patient is 84 y.o. female with diabetes mellitus type 2, diet-controlled, CVA, PE 2019 on DO AC, diastolic congestive heart failure, chronic kidney disease stage III, rheumatoid arthritis on immune suppressants, and chronic pain presented with pain that started 2 days prior to admission.  Mainly both hips and legs radiating down the legs with mild low back pain as well.  No focal weakness or numbness, able to ambulate with a walker at baseline but would not ambulate  secondary to the pain on day of admission.  No change in bowel bladder, no fevers, no midline back pain, no chest pain or shortness of breath, no abdominal pain no vomiting or diarrhea.  Family had noted a decline in function since recent hospitalization, with a surgery for gallstone pancreatitis.  Family did not think they could care for her in her current condition.  Patient was  admitted to [facility] from 12/13-17 where she was treated with IV pain medicines for her acute low back pain and lower extremity pain which was DJD.  MRI done in the emergency department of her lumbar spine showed no evidence of acute cord compression but did show degenerative joint disease.  She did not have any red flags including weight loss fevers loss of tone in her bladder or bowel.  Patient was evaluated by physical therapy and they recommended SNF with physical therapy.  Patient was admitted to SNF for OT/PT.  While at skilled nursing facility patient will be followed for chronic diastolic congestive heart failure treated with Coreg Lasix, PE treated with Xarelto and rheumatoid arthritis treated prednisone,ARAVA, hydroxychloroquine.  Past Medical History:  Diagnosis Date  . Allergic rhinitis due to pollen 04/27/2007  . Arthritis    "in q joint" (08/07/2013)  . CHF (congestive heart failure) (HCC)   . Coronary atherosclerosis of native coronary artery   . Cough   . Disorder of bone and cartilage, unspecified   . Edema 05/03/2013  . Exertional shortness of breath    "sometimes" (08/07/2013)  . First degree atrioventricular block   . Gout, unspecified 04/19/2013  . Hyperlipidemia 04/27/2007  . Hypertension   . Midline low back pain without sciatica 06/27/2014  . Muscle spasm   . Muscle weakness (generalized)   . Myocardial infarction (HCC) ~ 1970  . Onychia and paronychia of toe   . Osteoarthrosis, unspecified whether generalized or localized, unspecified site 04/27/2007  . Other fall   . Other malaise and fatigue   . Pneumonia 05/2018  .  Prepatellar bursitis   . Pulmonary embolism (HCC) 07/2018  . Rheumatic nodule 06/28/2017  . Seizures (HCC)   . Stroke (HCC) 01/17/2014  . TIA (transient ischemic attack)    "a few one summer" (08/07/2013)  . Type II diabetes mellitus (HCC)    "fasting 90-110s" (08/07/2013)  . Unspecified essential hypertension 08/08/2013  . Unspecified  vitamin D deficiency     Past Surgical History:  Procedure Laterality Date  . ABDOMINAL HYSTERECTOMY  04/1980  . APPENDECTOMY  1960  . CARDIAC CATHETERIZATION  2003  . CATARACT EXTRACTION W/ INTRAOCULAR LENS  IMPLANT, BILATERAL Bilateral ~ 2012  . CHOLECYSTECTOMY N/A 06/26/2018   Procedure: LAPAROSCOPIC CHOLECYSTECTOMY POSSIBLE INTRAOPERATIVE CHOLANGIOGRAM;  Surgeon: Abigail Miyamoto, MD;  Location: [facility] OR;  Service: General;  Laterality: N/A;  . JOINT REPLACEMENT    . REPLACEMENT TOTAL KNEE Right 09/2005  . RIGHT/LEFT HEART CATH AND CORONARY ANGIOGRAPHY N/A 06/01/2018   Procedure: RIGHT/LEFT HEART CATH AND CORONARY ANGIOGRAPHY;  Surgeon: Corky Crafts, MD;  Location: [facility] INVASIVE CV LAB;  Service: Cardiovascular;  Laterality: N/A;  . SHOULDER OPEN ROTATOR CUFF REPAIR Right 07/1999  . TONSILLECTOMY  08/1974  . TOTAL KNEE ARTHROPLASTY Left 08/07/2013   Procedure: TOTAL KNEE ARTHROPLASTY- LEFT;  Surgeon: Dannielle Huh, MD;  Location: [facility] OR;  Service: Orthopedics;  Laterality: Left;  . TRANSTHORACIC ECHOCARDIOGRAM  2003   EF 55-65%; mild concentric LVH    Allergies as of 10/26/2018      Reactions   Clonidine Derivatives Swelling   [name]   Fish Allergy Diarrhea, Swelling, Other (See Comments)   Turns skin "black," but can tolerate white fish Salmon- Diarrhea   Shellfish Allergy Hives   Doxycycline Rash   Indomethacin Other (See Comments)   Reaction not recalled by the patient   Lyrica [pregabalin] Other (See Comments)   Hallucinations   Methyldopa Other (See Comments)   Aldomet (for hypertension): Reaction not recalled by the patient   Morphine And Related Other (See Comments)   Family reports it drops her bp that she needs iv fluids   Orange Fruit [citrus] Other (See Comments)   Indigestion/heartburn   Strawberry (diagnostic) Itching   Cetirizine Hcl Itching, Rash   Codeine Itching   Levaquin  [levofloxacin In D5w] Rash   Tomato Rash      Medication List       Accurate as of October 26, 2018  2:06 PM. Always use your most recent med list.        albuterol 108 (90 Base) MCG/ACT inhaler Commonly known as:  PROVENTIL HFA;VENTOLIN HFA Inhale 1-2 puffs into the lungs every 6 (six) hours as needed for wheezing or shortness of breath.   allopurinol 100 MG tablet Commonly known as:  ZYLOPRIM Take 100 mg by mouth at bedtime.   carvedilol 6.25 MG tablet Commonly known as:  COREG Take 1 tablet (6.25 mg total) by mouth 2 (two) times daily with a meal.   D3-1000 25 MCG (1000 UT) capsule Generic drug:  Cholecalciferol Take 1,000 Units by mouth daily.   diclofenac sodium 1 % Gel Commonly known as:  VOLTAREN Apply 1 application topically daily.   ENSURE Take 237 mLs by mouth every evening.   ENTRESTO 49-51 MG Generic drug:  sacubitril-valsartan Take 1 tablet by mouth daily at 12 noon.   furosemide 40 MG tablet Commonly known as:  LASIX Take 0.5 tablets (20 mg total) by mouth daily.   gabapentin 100 MG capsule Commonly  known as:  NEURONTIN Take 1 capsule (100 mg total) by mouth at bedtime.   hydrALAZINE 100 MG tablet Commonly known as:  APRESOLINE Take 100 mg by mouth 3 (three) times daily.   hydroxychloroquine 200 MG tablet Commonly known as:  PLAQUENIL Take 200 mg by mouth 2 (two) times daily.   hydroxypropyl methylcellulose / hypromellose 2.5 % ophthalmic solution Commonly known as:  ISOPTO TEARS / GONIOVISC Place 1 drop into both eyes daily.   isosorbide mononitrate 30 MG 24 hr tablet Commonly known as:  IMDUR Take 1 tablet (30 mg total) by mouth at bedtime.   leflunomide 20 MG tablet Commonly known as:  ARAVA Take 20 mg by mouth daily.   Olopatadine HCl 0.2 % Soln Place 1 drop into both eyes daily.   potassium chloride SA 20 MEQ tablet Commonly known as:  K-DUR,KLOR-CON Take 1 tablet (20 mEq total) by mouth 2 (two) times daily.   predniSONE 10  MG tablet Commonly known as:  DELTASONE TAKE 1 TABLET DAILY   rivaroxaban 20 MG Tabs tablet Commonly known as:  XARELTO Take 20 mg by mouth daily with supper.   traMADol 50 MG tablet Commonly known as:  ULTRAM Take 2 tablets (100 mg total) by mouth every 6 (six) hours.   triamcinolone cream 0.1 % Commonly known as:  KENALOG Apply 1 application topically 2 (two) times daily as needed (for itching- to affected sites).   vitamin C 500 MG tablet Commonly known as:  ASCORBIC ACID Take 500 mg by mouth daily.       No orders of the defined types were placed in this encounter.   Immunization History  Administered Date(s) Administered  . Influenza Whole 09/06/2012  . Influenza, High Dose Seasonal PF 09/13/2017  . Influenza,inj,Quad PF,6+ Mos 09/12/2013, 09/11/2014, 07/23/2015, 09/16/2016, 09/07/2018  . Pneumococcal Polysaccharide-23 04/13/2005, 11/09/2009, 09/12/2015  . Td 04/13/2005  . Zoster 11/10/2011    Social History   Tobacco Use  . Smoking status: Never Smoker  . Smokeless tobacco: Never Used  Substance Use Topics  . Alcohol use: No    Alcohol/week: 0.0 standard drinks    Review of Systems  DATA OBTAINED: from patient, nurse GENERAL:  no fevers, fatigue, appetite changes SKIN: No itching, rash HEENT: No complaint RESPIRATORY: No cough, wheezing, SOB CARDIAC: No chest pain, palpitations, lower extremity edema  GI: No abdominal pain, No N/V/D or constipation, No heartburn or reflux  GU: No dysuria, frequency or urgency, or incontinence  MUSCULOSKELETAL: Bilateral knee pain, admits pain medicine works for a while NEUROLOGIC: No headache, dizziness  PSYCHIATRIC: No overt anxiety or sadness  Vitals:   10/26/18 1403  BP: (!) 142/81  Pulse: 89  Resp: 20  Temp: (!) 97.1 F (36.2 C)   Body mass index is 29.87 kg/m. Physical Exam  GENERAL APPEARANCE: Alert, conversant, No acute distress  SKIN: No diaphoresis rash HEENT: Unremarkable RESPIRATORY:  Breathing is even, unlabored. Lung sounds are clear   CARDIOVASCULAR: Heart RRR no murmurs, rubs or gallops. No peripheral edema  GASTROINTESTINAL: Abdomen is soft, non-tender, not distended w/ normal bowel sounds.  GENITOURINARY: Bladder non tender, not distended  MUSCULOSKELETAL: DJD knees, no fluid or point tenderness; patient is sitting with a warm blanket on her knees NEUROLOGIC: Cranial nerves 2-12 grossly intact. Moves all extremities PSYCHIATRIC: Mood and affect appropriate to situation, no behavioral issues  Patient Active Problem List   Diagnosis Date Noted  . Acute pain 10/21/2018  . Physical deconditioning 09/06/2018  . Hyperlipidemia associated with  type 2 diabetes mellitus (HCC), refuses statin 09/06/2018  . Refusal of statin medication by patient 09/04/2018  . Deep vein thrombosis (DVT) of distal vein of right lower extremity (HCC) 07/30/2018  . Hypertensive heart disease with heart failure (HCC) 07/30/2018  . Pulmonary embolism (HCC), 07/2018, on Xarelto with goal to DC after 6 months if more active 07/10/2018  . DCM (dilated cardiomyopathy) (HCC)   . Benign hypertensive heart and kidney disease with CHF and stage 3 chronic kidney disease (HCC) 05/28/2018  . Osteoarthritis 05/06/2018  . Hypokalemia 02/02/2018  . Adhesive capsulitis of right shoulder 10/12/2017  . Idiopathic chronic gout of multiple sites with tophus 08/25/2017  . History of total knee arthroplasty, bilateral 02/18/2017  . History of rotator cuff surgery 02/18/2017  . High risk medications, long-term use 09/21/2016  . Chronic combined systolic and diastolic CHF (congestive heart failure) (HCC) 09/12/2015  . Bilateral edema of lower extremity 06/05/2014  . CKD stage 3 due to type 2 diabetes mellitus (HCC) 06/05/2014  . DM (diabetes mellitus), type 2 with renal complications (HCC) 06/05/2014    Class: Chronic  . Anemia of chronic disease 05/01/2014  . Rheumatoid arthritis involving multiple sites (HCC)  12/19/2013    Class: Chronic  . CAD (coronary artery disease) 10/18/2013  . Hypertension associated with diabetes (HCC) 08/08/2013    Class: Chronic  . Fibromyalgia 05/10/2013  . Type 2 diabetes mellitus with hyperlipidemia (HCC), patient declines statin 04/27/2007  . Allergic rhinitis 04/27/2007  . Diverticulosis 04/27/2007    CMP     Component Value Date/Time   NA 137 10/22/2018 0344   NA 142 08/08/2018   K 3.6 10/22/2018 0344   CL 104 10/22/2018 0344   CO2 24 10/22/2018 0344   GLUCOSE 177 (H) 10/22/2018 0344   BUN 17 10/22/2018 0344   BUN 43 (A) 08/08/2018   CREATININE 0.86 10/22/2018 0344   CREATININE 1.19 (H) 07/21/2017 1454   CALCIUM 8.6 (L) 10/22/2018 0344   PROT 5.9 (L) 10/21/2018 0954   PROT 7.2 09/09/2015 1210   ALBUMIN 3.3 (L) 10/21/2018 0954   ALBUMIN 3.9 09/09/2015 1210   AST 20 10/21/2018 0954   ALT 11 10/21/2018 0954   ALKPHOS 41 10/21/2018 0954   BILITOT 1.0 10/21/2018 0954   BILITOT 0.5 09/09/2015 1210   GFRNONAA >60 10/22/2018 0344   GFRNONAA 42 (L) 07/21/2017 1454   GFRAA >60 10/22/2018 0344   GFRAA 48 (L) 07/21/2017 1454   Recent Labs    05/30/18 0449  06/01/18 0459  06/27/18 0630  09/05/18 2018 09/14/18 1451 10/21/18 0954 10/22/18 0344  NA 143   < > 141  140   < > 140   < >  --  136 136 137  K 4.1   < > 3.5  3.5   < > 3.5   < >  --  4.0 3.3* 3.6  CL 108   < > 108  107   < > 104   < >  --  102 104 104  CO2 25   < > 26  27   < > 26   < >  --  24 22 24   GLUCOSE 94   < > 109*  110*   < > 108*   < >  --  124* 96 177*  BUN 24*   < > 22  21   < > 17   < >  --  23 18 17   CREATININE 1.31*   < > 1.00  0.98   < > 1.10*   < >  --  1.24* 1.01* 0.86  CALCIUM 8.7*   < > 8.9  8.9   < > 8.7*   < >  --  9.9 8.4* 8.6*  MG 1.7  --   --   --   --   --  1.8  --   --  1.9  PHOS  --   --  2.7  --  2.7  --   --   --   --   --    < > = values in this interval not displayed.   Recent Labs    07/23/18 2100 08/29/18 1345 10/21/18 0954  AST 23 17 20     ALT 8 10 11   ALKPHOS 61 47 41  BILITOT 0.8 0.3 1.0  PROT 6.1* 6.7 5.9*  ALBUMIN 3.4* 3.7 3.3*   Recent Labs    09/05/18 1538 09/14/18 1451 10/21/18 0954 10/21/18 2140 10/22/18 0344  WBC 8.6 9.1 7.7  --  6.0  NEUTROABS 5.0 7.4 4.7  --   --   HGB 11.2* 11.6* 9.8*  --  10.6*  HCT 36.0 35.2* 31.4* 32.0* 33.9*  MCV 95.2 93.0 99.4  --  100.0  PLT 276 264.0 247  --  256   Recent Labs    05/29/18 0030 06/24/18 0524 06/25/18 0607  CHOL 178 189 202*  LDLCALC 92 113* 126*  TRIG 64 65 67   No results found for: [facility] Lab Results  Component Value Date   TSH 1.742 06/23/2018   Lab Results  Component Value Date   HGBA1C 5.9 (H) 06/25/2018   Lab Results  Component Value Date   CHOL 202 (H) 06/25/2018   HDL 63 06/25/2018   LDLCALC 126 (H) 06/25/2018   TRIG 67 06/25/2018   CHOLHDL 3.2 06/25/2018    Significant Diagnostic Results in last 30 days:  Dg Lumbar Spine Complete  Result Date: 10/21/2018 CLINICAL DATA:  Bilateral hip and leg pain EXAM: LUMBAR SPINE - COMPLETE 4+ VIEW COMPARISON:  Abdominal CT 09/11/2015 FINDINGS: Transitional S1 vertebra with rudimentary S1-2 disc space. Remote L1 superior endplate fracture based on prior. Height loss is mild. No acute fracture is seen. Advanced diffuse degenerative disc narrowing. Diffuse advanced facet arthropathy with spurring and anterolisthesis at L4-5 and L5-S1. There is milder retrolisthesis at L2-3. IMPRESSION: 1. No acute finding. 2. Remote L1 superior endplate fracture. 3. Advanced degenerative disease with multilevel listhesis. Electronically Signed   By: Marnee Spring [name]   On: 10/21/2018 10:30   Mr Lumbar Spine Wo Contrast  Result Date: 10/21/2018 CLINICAL DATA:  84 year old female with severe pain radiating to the hips and legs, worse on the right. EXAM: MRI LUMBAR SPINE WITHOUT CONTRAST TECHNIQUE: Multiplanar, multisequence MR imaging of the lumbar spine was performed. No intravenous contrast was administered.  COMPARISON:  Lumbar radiographs 1014 hours today. Abdomen MRI 06/23/2018, and earlier. FINDINGS: Segmentation: Nearly full size S1-S2 disc space, but otherwise normal segmentation with 5 lumbar type vertebral bodies and sacralized S1 level. Same numbering as on the plain films today. Alignment: Mild levoconvex lumbar scoliosis. Chronic grade 1 anterolisthesis at L4-L5 and L5-S1 is stable to mildly increased since 2016. Chronic mild retrolisthesis of L2 on L3 is stable. Vertebrae: No marrow edema or evidence of acute osseous abnormality. Chronic L1 compression fracture. Degenerative endplate signal changes in the lumbar spine. Intact visible sacrum and SI joints. Conus medullaris and cauda equina: Conus extends to the L1-L2 level. No  lower spinal cord or conus signal abnormality. Paraspinal and other soft tissues: Stable visible abdomen. Negative paraspinal soft tissues aside from posterior subcutaneous edema. Disc levels: No lower thoracic spinal stenosis. T12-L1: Mild grade 1 anterolisthesis with mild to moderate facet and ligament flavum hypertrophy. Mild disc bulge. No spinal stenosis. Mild left and moderate right T12 foraminal stenosis. L1-L2:  Mild to moderate facet hypertrophy.  No stenosis. L2-L3: Chronic disc space loss and mild retrolisthesis. Circumferential disc bulge with mild to moderate facet and ligament flavum hypertrophy. Mild spinal stenosis. Mild left and moderate right L2 foraminal stenosis. L3-L4: Chronic disc space loss. Vacuum disc here in 2016. Circumferential but mostly far lateral disc bulging and endplate spurring. Moderate facet and ligament flavum hypertrophy. Mild spinal and right lateral recess stenosis (right L4 nerve level). Mild left and moderate right L3 foraminal stenosis. L4-L5: Chronic anterolisthesis, disc space loss and vacuum disc. Circumferential disc/pseudo disc with severe facet and ligament flavum hypertrophy. Severe spinal stenosis with left greater than right lateral  recess stenosis (L5 nerve levels). Moderate to severe bilateral L4 foraminal stenosis. L5-S1: Chronic anterolisthesis with disc space loss and vacuum disc. Circumferential disc/pseudo disc. Severe facet and ligament flavum hypertrophy. Severe spinal and bilateral lateral recess stenosis (S1 nerve levels). Moderate left and moderate to severe right L5 foraminal stenosis. S1-S2: Full size disc space, otherwise negative with no stenosis. IMPRESSION: 1. No acute osseous abnormality. Chronic lumbar spondylolisthesis. Chronic L1 compression fracture. 2. Severe multifactorial spinal stenosis at L4-L5 and L5-S1, with severe lateral recess stenosis worse at the latter. Moderate to severe bilateral L5 foraminal stenosis at both levels. 3. Mild spinal stenosis at L2-L3 and L3-L4 with up to moderate right neural foraminal stenosis. Electronically Signed   By: Odessa Fleming [name]   On: 10/21/2018 17:32   Dg Hip Unilat W Or Wo Pelvis 2-3 Views Right  Result Date: 10/21/2018 CLINICAL DATA:  BILATERAL hip and leg pain EXAM: DG HIP (WITH OR WITHOUT PELVIS) 2-3V RIGHT COMPARISON:  Pelvic radiograph 01/24/2015, CT abdomen and pelvis 06/12/2014 FINDINGS: Austin mineralization. Narrowing of hip joints bilaterally. SI joints preserved. No acute fracture or dislocation. Bubbly well-circumscribed lytic lesion at the proximal LEFT femoral diaphysis lesion again identified, 4.7 x 2.0 cm, present and not significantly changed since at least 01/24/2015 and present on the prior CT exam from 2015 as well, generally benign in character. Scattered atherosclerotic calcifications. IMPRESSION: Degenerative changes of the hip joints bilaterally. No acute bony abnormalities. Chronic benign-appearing lytic lesion at proximal LEFT femoral diaphysis not significantly since 01/24/2015. Electronically Signed   By: Ulyses Southward [name]   On: 10/21/2018 10:37    Assessment and Plan  Acute on chronic low back pain/lower extremitypain/rheumatoid arthritis  -secondary to DJD per MRI: Patient was sent out with tramadol 50 mg every 6 hour SNF- patient is tolerating tramadol will increase it to 100 mg every 6 hours; continue hydroxychloroquine 200 mg 2 times daily, leflunomide 20 mg daily and prednisone 10 mg daily  History of PE SNF- continue Xarelto 20 mg daily  Diabetes mellitus type 2 SNF- last A1c 5.9; continue carb diet  Diastolic and systolic congestive heart failure SNF- continue Coreg 6.25 mg twice daily and Lasix milligrams daily  Nonobstructive coronary artery disease/hypertension SNF- continue Coreg 6.25 mg twice daily isosorbide mononitrate Imdur 30 mg nightly, hydralazine 100 mg 3 times daily, Entresto 49-51 1 p.o. daily    Time spent greater than 45 minutes;> 50% of time with patient was spent reviewing records, labs, tests and  studies, counseling and developing plan of care  Merrilee Seashore, MD

## 2018-10-27 ENCOUNTER — Non-Acute Institutional Stay (SKILLED_NURSING_FACILITY): Payer: Medicare Other | Admitting: Internal Medicine

## 2018-10-27 ENCOUNTER — Encounter: Payer: Self-pay | Admitting: Internal Medicine

## 2018-10-27 DIAGNOSIS — M25561 Pain in right knee: Secondary | ICD-10-CM | POA: Insufficient documentation

## 2018-10-27 DIAGNOSIS — E1121 Type 2 diabetes mellitus with diabetic nephropathy: Secondary | ICD-10-CM

## 2018-10-27 DIAGNOSIS — M549 Dorsalgia, unspecified: Secondary | ICD-10-CM | POA: Insufficient documentation

## 2018-10-27 DIAGNOSIS — I2609 Other pulmonary embolism with acute cor pulmonale: Secondary | ICD-10-CM

## 2018-10-27 DIAGNOSIS — G8929 Other chronic pain: Secondary | ICD-10-CM | POA: Insufficient documentation

## 2018-10-27 DIAGNOSIS — H1013 Acute atopic conjunctivitis, bilateral: Secondary | ICD-10-CM | POA: Diagnosis not present

## 2018-10-27 DIAGNOSIS — M8949 Other hypertrophic osteoarthropathy, multiple sites: Secondary | ICD-10-CM

## 2018-10-27 DIAGNOSIS — S81801D Unspecified open wound, right lower leg, subsequent encounter: Secondary | ICD-10-CM | POA: Diagnosis not present

## 2018-10-27 DIAGNOSIS — R52 Pain, unspecified: Secondary | ICD-10-CM | POA: Diagnosis not present

## 2018-10-27 DIAGNOSIS — M25562 Pain in left knee: Secondary | ICD-10-CM

## 2018-10-27 DIAGNOSIS — M0609 Rheumatoid arthritis without rheumatoid factor, multiple sites: Secondary | ICD-10-CM

## 2018-10-27 DIAGNOSIS — R600 Localized edema: Secondary | ICD-10-CM

## 2018-10-27 DIAGNOSIS — I130 Hypertensive heart and chronic kidney disease with heart failure and stage 1 through stage 4 chronic kidney disease, or unspecified chronic kidney disease: Secondary | ICD-10-CM

## 2018-10-27 DIAGNOSIS — M545 Low back pain, unspecified: Secondary | ICD-10-CM

## 2018-10-27 DIAGNOSIS — M069 Rheumatoid arthritis, unspecified: Secondary | ICD-10-CM

## 2018-10-27 DIAGNOSIS — I2510 Atherosclerotic heart disease of native coronary artery without angina pectoris: Secondary | ICD-10-CM

## 2018-10-27 DIAGNOSIS — M150 Primary generalized (osteo)arthritis: Secondary | ICD-10-CM

## 2018-10-27 DIAGNOSIS — N183 Chronic kidney disease, stage 3 unspecified: Secondary | ICD-10-CM

## 2018-10-27 DIAGNOSIS — M159 Polyosteoarthritis, unspecified: Secondary | ICD-10-CM

## 2018-10-27 DIAGNOSIS — I5042 Chronic combined systolic (congestive) and diastolic (congestive) heart failure: Secondary | ICD-10-CM

## 2018-10-27 NOTE — Progress Notes (Signed)
Location:  [facility] Room Number: [phone] Place of Service:  SNF [phone])  Randon Goldsmith. Lyn Hollingshead, MD  Patient Care Team: Helane Rima, DO as PCP - General (Family Medicine) Chilton Si, MD as PCP - Cardiology (Cardiology) Arlean Hopping, MD as Rounding Team (Internal Medicine)  Extended Emergency Contact Information Primary Emergency Contact: Hampton,Jeannette Address: [address]          [place], [place] [address] D[place] of [place] Home Phone: [phone] Relation: Daughter Secondary Emergency Contact: [name] Address: [address]          [place], [place] [address] [place] of [place] Mobile Phone: [phone] Relation: Daughter  Allergies  Allergen Reactions  . Clonidine Derivatives Swelling    [name]  . Fish Allergy Diarrhea, Swelling and Other (See Comments)    Turns skin "black," but can tolerate white fish Salmon- Diarrhea  . Shellfish Allergy Hives  . Doxycycline Rash  . Indomethacin Other (See Comments)    Reaction not recalled by the patient  . Lyrica [Pregabalin] Other (See Comments)    Hallucinations   . Methyldopa Other (See Comments)    Aldomet (for hypertension): Reaction not recalled by the patient  . Morphine And Related Other (See Comments)    Family reports it drops her bp that she needs iv fluids  . Orange Fruit [Citrus] Other (See Comments)    Indigestion/heartburn  . Strawberry (Diagnostic) Itching  . Cetirizine Hcl Itching and Rash  . Codeine Itching  . Levaquin [Levofloxacin In D5w] Rash  . Tomato Rash    Chief Complaint  Patient presents with  . Discharge Note    Discharge from [facility]    HPI:  84 y.o. female with diabetes mellitus type 2, diet-controlled, CVA, PE 2019 on DO AC, diastolic congestive heart failure, chronic kidney disease stage III, rheumatoid arthritis on immunosuppressants, and  chronic pain who presented with pain that started 2 days prior to admission.  Pain was mainly both hips and legs radiating down the legs with mild low back pain as well.  No focal weakness or numbness, able to ambulate with walker at baseline but would not ambulate secondary to pain the day of admission.  Family had noted decline in function since recent hospitalization for surgery for gallstone pancreatitis..  Family did not think they could care for her in her current condition.  Patient was admitted to [facility] from 12/13[facility]17 where she was treated with IV pain medications for her acute low back pain and lower extremity pain.  MRI done in the emergency department of her lumbar spine showed no evidence of acute cord compression but did show DJD.  She did not have any red flags including weight loss fevers loss of tone in her bowel or bladder.  Patient was evaluated by physical therapy and they recommended skilled nursing facility.  Patient was admitted to skilled nursing facility for OT/PT and is now ready to be discharged home.    Past Medical History:  Diagnosis Date  . Allergic rhinitis due to pollen 04/27/2007  . Arthritis    "in q joint" (08/07/2013)  . CHF (congestive heart failure) (HCC)   . Coronary atherosclerosis of native coronary artery   . Cough   . Disorder of bone and cartilage, unspecified   . Edema 05/03/2013  . Exertional shortness of breath    "sometimes" (08/07/2013)  . First degree atrioventricular block   .  Gout, unspecified 04/19/2013  . Hyperlipidemia 04/27/2007  . Hypertension   . Midline low back pain without sciatica 06/27/2014  . Muscle spasm   . Muscle weakness (generalized)   . Myocardial infarction (HCC) ~ 1970  . Onychia and paronychia of toe   . Osteoarthrosis, unspecified whether generalized or localized, unspecified site 04/27/2007  . Other fall   . Other malaise and fatigue   . Pneumonia 05/2018  . Prepatellar bursitis   . Pulmonary embolism  (HCC) 07/2018  . Rheumatic nodule 06/28/2017  . Seizures (HCC)   . Stroke (HCC) 01/17/2014  . TIA (transient ischemic attack)    "a few one summer" (08/07/2013)  . Type II diabetes mellitus (HCC)    "fasting 90-110s" (08/07/2013)  . Unspecified essential hypertension 08/08/2013  . Unspecified vitamin D deficiency     Past Surgical History:  Procedure Laterality Date  . ABDOMINAL HYSTERECTOMY  04/1980  . APPENDECTOMY  1960  . CARDIAC CATHETERIZATION  2003  . CATARACT EXTRACTION W/ INTRAOCULAR LENS  IMPLANT, BILATERAL Bilateral ~ 2012  . CHOLECYSTECTOMY N/A 06/26/2018   Procedure: LAPAROSCOPIC CHOLECYSTECTOMY POSSIBLE INTRAOPERATIVE CHOLANGIOGRAM;  Surgeon: Abigail Miyamoto, MD;  Location: [facility] OR;  Service: General;  Laterality: N/A;  . JOINT REPLACEMENT    . REPLACEMENT TOTAL KNEE Right 09/2005  . RIGHT/LEFT HEART CATH AND CORONARY ANGIOGRAPHY N/A 06/01/2018   Procedure: RIGHT/LEFT HEART CATH AND CORONARY ANGIOGRAPHY;  Surgeon: Corky Crafts, MD;  Location: [facility] INVASIVE CV LAB;  Service: Cardiovascular;  Laterality: N/A;  . SHOULDER OPEN ROTATOR CUFF REPAIR Right 07/1999  . TONSILLECTOMY  08/1974  . TOTAL KNEE ARTHROPLASTY Left 08/07/2013   Procedure: TOTAL KNEE ARTHROPLASTY- LEFT;  Surgeon: Dannielle Huh, MD;  Location: [facility] OR;  Service: Orthopedics;  Laterality: Left;  . TRANSTHORACIC ECHOCARDIOGRAM  2003   EF 55-65%; mild concentric LVH     reports that she has never smoked. She has never used smokeless tobacco. She reports that she does not drink alcohol or use drugs. Social History   Socioeconomic History  . Marital status: Widowed    Spouse name: Not on file  . Number of children: Not on file  . Years of education: Not on file  . Highest education level: Not on file  Occupational History  . Not on file  Social Needs  . Financial resource strain: Not on file  . Food insecurity:    Worry: Not on file    Inability: Not on file  . Transportation needs:    Medical: Not on  file    Non-medical: Not on file  Tobacco Use  . Smoking status: Never Smoker  . Smokeless tobacco: Never Used  Substance and Sexual Activity  . Alcohol use: No    Alcohol/week: 0.0 standard drinks  . Drug use: No  . Sexual activity: Never  Lifestyle  . Physical activity:    Days per week: Not on file    Minutes per session: Not on file  . Stress: Not on file  Relationships  . Social connections:    Talks on phone: Not on file    Gets together: Not on file    Attends religious service: Not on file    Active member of club or organization: Not on file    Attends meetings of clubs or organizations: Not on file    Relationship status: Not on file  . Intimate partner violence:    Fear of current or ex partner: Not on file    Emotionally abused: Not on  file    Physically abused: Not on file    Forced sexual activity: Not on file  Other Topics Concern  . Not on file  Social History Narrative   Widowed   Walks with cane    Pertinent  Health Maintenance Due  Topic Date Due  . OPHTHALMOLOGY EXAM  09/12/2015  . PNA vac Low Risk Adult (2 of 2 - PCV13) 09/11/2016  . HEMOGLOBIN A1C  12/26/2018  . FOOT EXAM  08/30/2019  . INFLUENZA VACCINE  Completed  . DEXA SCAN  Completed    Medications: Allergies as of 10/27/2018      Reactions   Clonidine Derivatives Swelling   [name]   Fish Allergy Diarrhea, Swelling, Other (See Comments)   Turns skin "black," but can tolerate white fish Salmon- Diarrhea   Shellfish Allergy Hives   Doxycycline Rash   Indomethacin Other (See Comments)   Reaction not recalled by the patient   Lyrica [pregabalin] Other (See Comments)   Hallucinations   Methyldopa Other (See Comments)   Aldomet (for hypertension): Reaction not recalled by the patient   Morphine And Related Other (See Comments)   Family reports it drops her bp that she needs iv fluids   Orange Fruit [citrus]  Other (See Comments)   Indigestion/heartburn   Strawberry (diagnostic) Itching   Cetirizine Hcl Itching, Rash   Codeine Itching   Levaquin [levofloxacin In D5w] Rash   Tomato Rash      Medication List       Accurate as of October 27, 2018  4:36 PM. Always use your most recent med list.        albuterol 108 (90 Base) MCG/ACT inhaler Commonly known as:  PROVENTIL HFA;VENTOLIN HFA Inhale 1-2 puffs into the lungs every 6 (six) hours as needed for wheezing or shortness of breath.   allopurinol 100 MG tablet Commonly known as:  ZYLOPRIM Take 100 mg by mouth at bedtime.   carvedilol 6.25 MG tablet Commonly known as:  COREG Take 1 tablet (6.25 mg total) by mouth 2 (two) times daily with a meal.   D3-1000 25 MCG (1000 UT) capsule Generic drug:  Cholecalciferol Take 1,000 Units by mouth daily.   diclofenac sodium 1 % Gel Commonly known as:  VOLTAREN Apply 1 application topically daily.   ENSURE Take 237 mLs by mouth every evening.   ENTRESTO 49-51 MG Generic drug:  sacubitril-valsartan Take 1 tablet by mouth daily at 12 noon.   furosemide 40 MG tablet Commonly known as:  LASIX Take 0.5 tablets (20 mg total) by mouth daily.   gabapentin 100 MG capsule Commonly known as:  NEURONTIN Take 1 capsule (100 mg total) by mouth at bedtime.   hydrALAZINE 100 MG tablet Commonly known as:  APRESOLINE Take 100 mg by mouth 3 (three) times daily.   hydroxychloroquine 200 MG tablet Commonly known as:  PLAQUENIL Take 200 mg by mouth 2 (two) times daily.   hydroxypropyl methylcellulose / hypromellose 2.5 % ophthalmic solution Commonly known as:  ISOPTO TEARS / GONIOVISC Place 1 drop into both eyes daily.   isosorbide mononitrate 30 MG 24 hr tablet Commonly known as:  IMDUR Take 1 tablet (30 mg total) by mouth at bedtime.   leflunomide 20 MG tablet Commonly known as:  ARAVA Take 20 mg by mouth daily.   Olopatadine HCl 0.2 % Soln Place 1 drop into both eyes daily.     potassium chloride SA 20 MEQ tablet  Commonly known as:  K-DUR,KLOR-CON Take 1 tablet (20 mEq total) by mouth 2 (two) times daily.   predniSONE 10 MG tablet Commonly known as:  DELTASONE TAKE 1 TABLET DAILY   rivaroxaban 20 MG Tabs tablet Commonly known as:  XARELTO Take 20 mg by mouth daily with supper.   traMADol 50 MG tablet Commonly known as:  ULTRAM Take 2 tablets (100 mg total) by mouth every 6 (six) hours.   triamcinolone cream 0.1 % Commonly known as:  KENALOG Apply 1 application topically 2 (two) times daily as needed (for itching- to affected sites).   vitamin C 500 MG tablet Commonly known as:  ASCORBIC ACID Take 500 mg by mouth daily.        Vitals:   10/27/18 1621  BP: 107/66  Pulse: (!) 102  Resp: 18  Temp: 98 F (36.7 C)  Weight: 174 lb (78.9 kg)  Height: 5\' 4"  (1.626 m)   Body mass index is 29.87 kg/m.  Physical Exam  GENERAL APPEARANCE: Alert, conversant. No acute distress.  HEENT: Unremarkable. RESPIRATORY: Breathing is even, unlabored. Lung sounds are clear   CARDIOVASCULAR: Heart RRR no murmurs, rubs or gallops. No peripheral edema.  GASTROINTESTINAL: Abdomen is soft, non-tender, not distended w/ normal bowel sounds.  NEUROLOGIC: Cranial nerves 2-12 grossly intact. Moves all extremities   Labs reviewed: Basic Metabolic Panel: Recent Labs    05/30/18 0449  06/01/18 0459  06/27/18 0630  09/05/18 2018 09/14/18 1451 10/21/18 0954 10/22/18 0344  NA 143   < > 141  140   < > 140   < >  --  136 136 137  K 4.1   < > 3.5  3.5   < > 3.5   < >  --  4.0 3.3* 3.6  CL 108   < > 108  107   < > 104   < >  --  102 104 104  CO2 25   < > 26  27   < > 26   < >  --  24 22 24   GLUCOSE 94   < > 109*  110*   < > 108*   < >  --  124* 96 177*  BUN 24*   < > 22  21   < > 17   < >  --  23 18 17   CREATININE 1.31*   < > 1.00  0.98   < > 1.10*   < >  --  1.24* 1.01* 0.86  CALCIUM 8.7*   < > 8.9  8.9   < > 8.7*   < >  --  9.9 8.4* 8.6*  MG 1.7  --    --   --   --   --  1.8  --   --  1.9  PHOS  --   --  2.7  --  2.7  --   --   --   --   --    < > = values in this interval not displayed.   No results found for: [facility] Liver Function Tests: Recent Labs    07/23/18 2100 08/29/18 1345 10/21/18 0954  AST 23 17 20   ALT 8 10 11   ALKPHOS 61 47 41  BILITOT 0.8 0.3 1.0  PROT 6.1* 6.7 5.9*  ALBUMIN 3.4* 3.7 3.3*   Recent Labs    06/22/18 2153 06/23/18 0500 07/23/18 2100  LIPASE 150* 77* 50   No results for input(s): AMMONIA in the last  8760 hours. CBC: Recent Labs    09/05/18 1538 09/14/18 1451 10/21/18 0954 10/21/18 2140 10/22/18 0344  WBC 8.6 9.1 7.7  --  6.0  NEUTROABS 5.0 7.4 4.7  --   --   HGB 11.2* 11.6* 9.8*  --  10.6*  HCT 36.0 35.2* 31.4* 32.0* 33.9*  MCV 95.2 93.0 99.4  --  100.0  PLT 276 264.0 247  --  256   Lipid Recent Labs    05/29/18 0030 06/24/18 0524 06/25/18 0607  CHOL 178 189 202*  HDL 73 63 63  LDLCALC 92 113* 126*  TRIG 64 65 67   Cardiac Enzymes: Recent Labs    09/05/18 2018 09/06/18 0131 09/06/18 0716  TROPONINI 0.04* 0.03* 0.03*   BNP: Recent Labs    05/28/18 1614 06/22/18 2153 07/23/18 2100  BNP 2,077.6* 1,146.7* 138.3*   CBG: Recent Labs    09/07/18 0750 09/07/18 1149 09/07/18 1644  GLUCAP 131* 162* 156*    Procedures and Imaging Studies During Stay: Dg Lumbar Spine Complete  Result Date: 10/21/2018 CLINICAL DATA:  Bilateral hip and leg pain EXAM: LUMBAR SPINE - COMPLETE 4+ VIEW COMPARISON:  Abdominal CT 09/11/2015 FINDINGS: Transitional S1 vertebra with rudimentary S1-2 disc space. Remote L1 superior endplate fracture based on prior. Height loss is mild. No acute fracture is seen. Advanced diffuse degenerative disc narrowing. Diffuse advanced facet arthropathy with spurring and anterolisthesis at L4-5 and L5-S1. There is milder retrolisthesis at L2-3. IMPRESSION: 1. No acute finding. 2. Remote L1 superior endplate fracture. 3. Advanced degenerative disease with  multilevel listhesis. Electronically Signed   By: Marnee SpringJonathon  Watts [name]   On: 10/21/2018 10:30   Mr Lumbar Spine Wo Contrast  Result Date: 10/21/2018 CLINICAL DATA:  84 year old female with severe pain radiating to the hips and legs, worse on the right. EXAM: MRI LUMBAR SPINE WITHOUT CONTRAST TECHNIQUE: Multiplanar, multisequence MR imaging of the lumbar spine was performed. No intravenous contrast was administered. COMPARISON:  Lumbar radiographs 1014 hours today. Abdomen MRI 06/23/2018, and earlier. FINDINGS: Segmentation: Nearly full size S1-S2 disc space, but otherwise normal segmentation with 5 lumbar type vertebral bodies and sacralized S1 level. Same numbering as on the plain films today. Alignment: Mild levoconvex lumbar scoliosis. Chronic grade 1 anterolisthesis at L4-L5 and L5-S1 is stable to mildly increased since 2016. Chronic mild retrolisthesis of L2 on L3 is stable. Vertebrae: No marrow edema or evidence of acute osseous abnormality. Chronic L1 compression fracture. Degenerative endplate signal changes in the lumbar spine. Intact visible sacrum and SI joints. Conus medullaris and cauda equina: Conus extends to the L1-L2 level. No lower spinal cord or conus signal abnormality. Paraspinal and other soft tissues: Stable visible abdomen. Negative paraspinal soft tissues aside from posterior subcutaneous edema. Disc levels: No lower thoracic spinal stenosis. T12-L1: Mild grade 1 anterolisthesis with mild to moderate facet and ligament flavum hypertrophy. Mild disc bulge. No spinal stenosis. Mild left and moderate right T12 foraminal stenosis. L1-L2:  Mild to moderate facet hypertrophy.  No stenosis. L2-L3: Chronic disc space loss and mild retrolisthesis. Circumferential disc bulge with mild to moderate facet and ligament flavum hypertrophy. Mild spinal stenosis. Mild left and moderate right L2 foraminal stenosis. L3-L4: Chronic disc space loss. Vacuum disc here in 2016. Circumferential but mostly far  lateral disc bulging and endplate spurring. Moderate facet and ligament flavum hypertrophy. Mild spinal and right lateral recess stenosis (right L4 nerve level). Mild left and moderate right L3 foraminal stenosis. L4-L5: Chronic anterolisthesis, disc space loss and vacuum disc.  Circumferential disc/pseudo disc with severe facet and ligament flavum hypertrophy. Severe spinal stenosis with left greater than right lateral recess stenosis (L5 nerve levels). Moderate to severe bilateral L4 foraminal stenosis. L5-S1: Chronic anterolisthesis with disc space loss and vacuum disc. Circumferential disc/pseudo disc. Severe facet and ligament flavum hypertrophy. Severe spinal and bilateral lateral recess stenosis (S1 nerve levels). Moderate left and moderate to severe right L5 foraminal stenosis. S1-S2: Full size disc space, otherwise negative with no stenosis. IMPRESSION: 1. No acute osseous abnormality. Chronic lumbar spondylolisthesis. Chronic L1 compression fracture. 2. Severe multifactorial spinal stenosis at L4-L5 and L5-S1, with severe lateral recess stenosis worse at the latter. Moderate to severe bilateral L5 foraminal stenosis at both levels. 3. Mild spinal stenosis at L2-L3 and L3-L4 with up to moderate right neural foraminal stenosis. Electronically Signed   By: Odessa Fleming [name]   On: 10/21/2018 17:32   Dg Hip Unilat W Or Wo Pelvis 2-3 Views Right  Result Date: 10/21/2018 CLINICAL DATA:  BILATERAL hip and leg pain EXAM: DG HIP (WITH OR WITHOUT PELVIS) 2-3V RIGHT COMPARISON:  Pelvic radiograph 01/24/2015, CT abdomen and pelvis 06/12/2014 FINDINGS: Austin mineralization. Narrowing of hip joints bilaterally. SI joints preserved. No acute fracture or dislocation. Bubbly well-circumscribed lytic lesion at the proximal LEFT femoral diaphysis lesion again identified, 4.7 x 2.0 cm, present and not significantly changed since at least 01/24/2015 and present on the prior CT exam from 2015 as well, generally benign in  character. Scattered atherosclerotic calcifications. IMPRESSION: Degenerative changes of the hip joints bilaterally. No acute bony abnormalities. Chronic benign-appearing lytic lesion at proximal LEFT femoral diaphysis not significantly since 01/24/2015. Electronically Signed   By: Ulyses Southward [name]   On: 10/21/2018 10:37    Assessment/Plan:   No diagnosis found.   Patient is being discharged with the following home health services: OT/PT  Patient is being discharged with the following durable medical equipment: None  Patient has been advised to f/u with their PCP in 1-2 weeks to bring them up to date on their rehab stay.  Social services at facility was responsible for arranging this appointment.  Pt was provided with a 30 day supply of prescriptions for medications and refills must be obtained from their PCP.  For controlled substances, a more limited supply may be provided adequate until PCP appointment only.  Medications have been reconciled.  I am spent greater than 30 minutes;> 50% of time with patient was spent reviewing records, labs, tests and studies, counseling and developing plan of care  Randon Goldsmith. Lyn Hollingshead, MD

## 2018-10-28 ENCOUNTER — Encounter: Payer: Self-pay | Admitting: Internal Medicine

## 2018-10-28 MED ORDER — CARVEDILOL 6.25 MG PO TABS: 6.2500 mg | ORAL_TABLET | Freq: Two times a day (BID) | ORAL | 0 refills | Status: DC

## 2018-10-28 MED ORDER — TRIAMCINOLONE ACETONIDE 0.1 % EX CREA: 1.0000 "application " | TOPICAL_CREAM | Freq: Two times a day (BID) | CUTANEOUS | 0 refills | Status: DC | PRN

## 2018-10-28 MED ORDER — FUROSEMIDE 40 MG PO TABS: 20.0000 mg | ORAL_TABLET | Freq: Every day | ORAL | 0 refills | Status: DC

## 2018-10-28 MED ORDER — SACUBITRIL-VALSARTAN 49-51 MG PO TABS: 1.0000 | ORAL_TABLET | Freq: Every day | ORAL | 0 refills | Status: DC

## 2018-10-28 MED ORDER — RIVAROXABAN 20 MG PO TABS: 20.0000 mg | ORAL_TABLET | Freq: Every day | ORAL | 0 refills | Status: DC

## 2018-10-28 MED ORDER — OLOPATADINE HCL 0.2 % OP SOLN: 1.0000 [drp] | Freq: Every day | OPHTHALMIC | 0 refills | Status: DC

## 2018-10-28 MED ORDER — HYDROXYCHLOROQUINE SULFATE 200 MG PO TABS: 200.0000 mg | ORAL_TABLET | Freq: Two times a day (BID) | ORAL | 0 refills | Status: DC

## 2018-10-28 MED ORDER — VITAMIN C 500 MG PO TABS: 500.0000 mg | ORAL_TABLET | Freq: Every day | ORAL | 0 refills | Status: DC

## 2018-10-28 MED ORDER — DICLOFENAC SODIUM 1 % TD GEL: 2.0000 g | Freq: Four times a day (QID) | TRANSDERMAL | 3 refills | Status: DC | PRN

## 2018-10-28 MED ORDER — TRAMADOL HCL 50 MG PO TABS: 100.0000 mg | ORAL_TABLET | Freq: Four times a day (QID) | ORAL | 0 refills | Status: DC

## 2018-10-28 MED ORDER — ISOSORBIDE MONONITRATE ER 30 MG PO TB24: 30.0000 mg | ORAL_TABLET | Freq: Every day | ORAL | 0 refills | Status: DC

## 2018-10-28 MED ORDER — LEFLUNOMIDE 20 MG PO TABS: 20.0000 mg | ORAL_TABLET | Freq: Every day | ORAL | 0 refills | Status: DC

## 2018-10-28 MED ORDER — GABAPENTIN 100 MG PO CAPS: 100.0000 mg | ORAL_CAPSULE | Freq: Every day | ORAL | 3 refills | Status: DC

## 2018-10-28 MED ORDER — HYDRALAZINE HCL 100 MG PO TABS: 100.0000 mg | ORAL_TABLET | Freq: Three times a day (TID) | ORAL | 0 refills | Status: DC

## 2018-10-28 MED ORDER — HYPROMELLOSE (GONIOSCOPIC) 2.5 % OP SOLN: 1.0000 [drp] | Freq: Every day | OPHTHALMIC | 0 refills | Status: DC

## 2018-10-28 MED ORDER — ALBUTEROL SULFATE HFA 108 (90 BASE) MCG/ACT IN AERS: 1.0000 | INHALATION_SPRAY | Freq: Four times a day (QID) | RESPIRATORY_TRACT | 0 refills | Status: DC | PRN

## 2018-10-28 MED ORDER — POTASSIUM CHLORIDE CRYS ER 20 MEQ PO TBCR: 20.0000 meq | EXTENDED_RELEASE_TABLET | Freq: Two times a day (BID) | ORAL | 0 refills | Status: DC

## 2018-10-28 MED ORDER — PREDNISONE 10 MG PO TABS: 10.0000 mg | ORAL_TABLET | Freq: Every day | ORAL | 0 refills | Status: DC

## 2018-10-28 MED ORDER — D3-1000 25 MCG (1000 UT) PO CAPS: 1000.0000 [IU] | ORAL_CAPSULE | Freq: Every day | ORAL | 0 refills | Status: DC

## 2018-10-28 MED ORDER — ALLOPURINOL 100 MG PO TABS: 100.0000 mg | ORAL_TABLET | Freq: Every day | ORAL | 0 refills | Status: DC

## 2018-10-31 ENCOUNTER — Telehealth: Payer: Self-pay | Admitting: Family Medicine

## 2018-10-31 NOTE — Telephone Encounter (Signed)
See note  Copied from CRM [id number]. Topic: Quick Communication - See Telephone Encounter >> Oct 31, 2018  2:16 PM Terisa Starr wrote: CRM for notification. See Telephone encounter for: 10/31/18.  Matthias Hughs RN with [facility] called to let Dr Earlene Plater know that they will start services on 12/30. FYI.

## 2018-11-03 ENCOUNTER — Ambulatory Visit: Payer: Self-pay | Admitting: *Deleted

## 2018-11-03 NOTE — Telephone Encounter (Signed)
See note

## 2018-11-03 NOTE — Telephone Encounter (Signed)
FYI

## 2018-11-03 NOTE — Telephone Encounter (Signed)
Ms.Hampton is calling for advise in regards to the patient pain level. She stated the patient was in the ED on 10-21-18, please Advise   Mother has been in hospital and rehab recently. MRI- spinal stenosis and rheumatoid arthritis. Rheumatoid specialist is suggesting pain management for pain control. Daughter is calling to get guidance as far as pain management- she wants to know what she is supposed to do. Mother is sore and stiff now. She was told to take her back to hospital if she got worse.  Call to office- they assure that they will make sure patient has good pain control until she can get into pain management- daughter and patient state she has plenty of medication to get her to appointment. She is presently taking Tramadol every 6 hours and she has oxycodone if needed- she does not take that often. Daughter is concerned because patient had to take oxycodone pill last night due to pain.  Reason for Disposition . [1] MODERATE pain (e.g., interferes with normal activities, limping) AND [2] present > 3 days    Patient has hospital follow up scheduled- pain management is concern  Answer Assessment - Initial Assessment Questions 1. ONSET: "When did the pain start?"      Pain for some time- patient was admitted to the hospital for it 2. LOCATION: "Where is the pain located?"       Bilateral legs and knees- MRI- spinal stenosis 3. PAIN: "How bad is the pain?"    (Scale 1-10; or mild, moderate, severe)   -  MILD (1-3): doesn't interfere with normal activities    -  MODERATE (4-7): interferes with normal activities (e.g., work or school) or awakens from sleep, limping    -  SEVERE (8-10): excruciating pain, unable to do any normal activities, unable to walk     Severe- weakness and stiffness in joints 4. WORK OR EXERCISE: "Has there been any recent work or exercise that involved this part of the body?"      no 5. CAUSE: "What do you think is causing the leg pain?"     Spinal stenosis 6. OTHER  SYMPTOMS: "Do you have any other symptoms?" (e.g., chest pain, back pain, breathing difficulty, swelling, rash, fever, numbness, weakness)     No pain in back- just thighs, knees, elbows 7. PREGNANCY: "Is there any chance you are pregnant?" "When was your last menstrual period?"     n/a  Protocols used: LEG PAIN-A-AH

## 2018-11-10 ENCOUNTER — Telehealth: Payer: Self-pay | Admitting: Family Medicine

## 2018-11-10 ENCOUNTER — Other Ambulatory Visit: Payer: Self-pay | Admitting: Internal Medicine

## 2018-11-10 DIAGNOSIS — I440 Atrioventricular block, first degree: Secondary | ICD-10-CM | POA: Diagnosis not present

## 2018-11-10 DIAGNOSIS — M0609 Rheumatoid arthritis without rheumatoid factor, multiple sites: Secondary | ICD-10-CM | POA: Diagnosis not present

## 2018-11-10 DIAGNOSIS — Z86711 Personal history of pulmonary embolism: Secondary | ICD-10-CM | POA: Diagnosis not present

## 2018-11-10 DIAGNOSIS — D631 Anemia in chronic kidney disease: Secondary | ICD-10-CM | POA: Diagnosis not present

## 2018-11-10 DIAGNOSIS — Z7952 Long term (current) use of systemic steroids: Secondary | ICD-10-CM | POA: Diagnosis not present

## 2018-11-10 DIAGNOSIS — M109 Gout, unspecified: Secondary | ICD-10-CM | POA: Diagnosis not present

## 2018-11-10 DIAGNOSIS — E1169 Type 2 diabetes mellitus with other specified complication: Secondary | ICD-10-CM | POA: Diagnosis not present

## 2018-11-10 DIAGNOSIS — Z86718 Personal history of other venous thrombosis and embolism: Secondary | ICD-10-CM | POA: Diagnosis not present

## 2018-11-10 DIAGNOSIS — M4856XD Collapsed vertebra, not elsewhere classified, lumbar region, subsequent encounter for fracture with routine healing: Secondary | ICD-10-CM | POA: Diagnosis not present

## 2018-11-10 DIAGNOSIS — M4316 Spondylolisthesis, lumbar region: Secondary | ICD-10-CM | POA: Diagnosis not present

## 2018-11-10 DIAGNOSIS — M4807 Spinal stenosis, lumbosacral region: Secondary | ICD-10-CM | POA: Diagnosis not present

## 2018-11-10 DIAGNOSIS — Z8673 Personal history of transient ischemic attack (TIA), and cerebral infarction without residual deficits: Secondary | ICD-10-CM | POA: Diagnosis not present

## 2018-11-10 DIAGNOSIS — Z79891 Long term (current) use of opiate analgesic: Secondary | ICD-10-CM | POA: Diagnosis not present

## 2018-11-10 DIAGNOSIS — I2510 Atherosclerotic heart disease of native coronary artery without angina pectoris: Secondary | ICD-10-CM | POA: Diagnosis not present

## 2018-11-10 DIAGNOSIS — N183 Chronic kidney disease, stage 3 (moderate): Secondary | ICD-10-CM | POA: Diagnosis not present

## 2018-11-10 DIAGNOSIS — E785 Hyperlipidemia, unspecified: Secondary | ICD-10-CM | POA: Diagnosis not present

## 2018-11-10 DIAGNOSIS — I110 Hypertensive heart disease with heart failure: Secondary | ICD-10-CM | POA: Diagnosis not present

## 2018-11-10 DIAGNOSIS — M160 Bilateral primary osteoarthritis of hip: Secondary | ICD-10-CM | POA: Diagnosis not present

## 2018-11-10 DIAGNOSIS — E1122 Type 2 diabetes mellitus with diabetic chronic kidney disease: Secondary | ICD-10-CM | POA: Diagnosis not present

## 2018-11-10 DIAGNOSIS — M5440 Lumbago with sciatica, unspecified side: Secondary | ICD-10-CM | POA: Diagnosis not present

## 2018-11-10 DIAGNOSIS — I5042 Chronic combined systolic (congestive) and diastolic (congestive) heart failure: Secondary | ICD-10-CM | POA: Diagnosis not present

## 2018-11-10 DIAGNOSIS — Z96653 Presence of artificial knee joint, bilateral: Secondary | ICD-10-CM | POA: Diagnosis not present

## 2018-11-10 DIAGNOSIS — Z7901 Long term (current) use of anticoagulants: Secondary | ICD-10-CM | POA: Diagnosis not present

## 2018-11-10 DIAGNOSIS — Z7982 Long term (current) use of aspirin: Secondary | ICD-10-CM | POA: Diagnosis not present

## 2018-11-10 NOTE — Telephone Encounter (Signed)
See note

## 2018-11-10 NOTE — Telephone Encounter (Signed)
Copied from CRM [id number]. Topic: Quick Communication - See Telephone Encounter >> Nov 10, 2018  1:32 PM Terisa Starr wrote: CRM for notification. See Telephone encounter for: 11/10/18.  Matthias Hughs RN with [facility] would like Dr Earlene Plater to know that physical therapy will go out today and see her for their services. Thanks

## 2018-11-10 NOTE — Telephone Encounter (Signed)
FYI

## 2018-11-11 ENCOUNTER — Telehealth: Payer: Self-pay | Admitting: Family Medicine

## 2018-11-11 NOTE — Telephone Encounter (Signed)
See note

## 2018-11-11 NOTE — Telephone Encounter (Signed)
Called and gave verbal for P.T. 

## 2018-11-11 NOTE — Telephone Encounter (Signed)
Copied from CRM [id number]. Topic: Quick Communication - See Telephone Encounter >> Nov 11, 2018  1:50 PM Burchel, Abbi R wrote: CRM for notification. See Telephone encounter for: 11/11/18.  Shon Hale ([facility] [phone]) requesting v/o for PT 2x4wks.    *ok to leave VM

## 2018-11-14 ENCOUNTER — Observation Stay (HOSPITAL_COMMUNITY)
Admission: EM | Admit: 2018-11-14 | Discharge: 2018-11-17 | Disposition: A | Discharge status: Skilled Nursing Facility | Payer: Medicare Other | Attending: Internal Medicine | Admitting: Internal Medicine

## 2018-11-14 ENCOUNTER — Ambulatory Visit: Payer: Self-pay | Admitting: Family Medicine

## 2018-11-14 ENCOUNTER — Encounter (HOSPITAL_COMMUNITY): Payer: Self-pay | Admitting: Emergency Medicine

## 2018-11-14 ENCOUNTER — Telehealth: Payer: Self-pay | Admitting: Family Medicine

## 2018-11-14 ENCOUNTER — Other Ambulatory Visit: Payer: Self-pay

## 2018-11-14 DIAGNOSIS — Z8673 Personal history of transient ischemic attack (TIA), and cerebral infarction without residual deficits: Secondary | ICD-10-CM | POA: Insufficient documentation

## 2018-11-14 DIAGNOSIS — M4800 Spinal stenosis, site unspecified: Secondary | ICD-10-CM | POA: Diagnosis not present

## 2018-11-14 DIAGNOSIS — I2510 Atherosclerotic heart disease of native coronary artery without angina pectoris: Secondary | ICD-10-CM | POA: Diagnosis present

## 2018-11-14 DIAGNOSIS — I491 Atrial premature depolarization: Secondary | ICD-10-CM | POA: Diagnosis not present

## 2018-11-14 DIAGNOSIS — R4182 Altered mental status, unspecified: Secondary | ICD-10-CM | POA: Insufficient documentation

## 2018-11-14 DIAGNOSIS — R52 Pain, unspecified: Secondary | ICD-10-CM | POA: Diagnosis present

## 2018-11-14 DIAGNOSIS — D638 Anemia in other chronic diseases classified elsewhere: Secondary | ICD-10-CM | POA: Diagnosis present

## 2018-11-14 DIAGNOSIS — Z86718 Personal history of other venous thrombosis and embolism: Secondary | ICD-10-CM | POA: Insufficient documentation

## 2018-11-14 DIAGNOSIS — M109 Gout, unspecified: Secondary | ICD-10-CM | POA: Diagnosis present

## 2018-11-14 DIAGNOSIS — N183 Chronic kidney disease, stage 3 unspecified: Secondary | ICD-10-CM | POA: Diagnosis present

## 2018-11-14 DIAGNOSIS — E1122 Type 2 diabetes mellitus with diabetic chronic kidney disease: Secondary | ICD-10-CM | POA: Diagnosis not present

## 2018-11-14 DIAGNOSIS — I10 Essential (primary) hypertension: Secondary | ICD-10-CM | POA: Diagnosis not present

## 2018-11-14 DIAGNOSIS — E785 Hyperlipidemia, unspecified: Secondary | ICD-10-CM | POA: Diagnosis not present

## 2018-11-14 DIAGNOSIS — M48061 Spinal stenosis, lumbar region without neurogenic claudication: Secondary | ICD-10-CM | POA: Insufficient documentation

## 2018-11-14 DIAGNOSIS — I130 Hypertensive heart and chronic kidney disease with heart failure and stage 1 through stage 4 chronic kidney disease, or unspecified chronic kidney disease: Secondary | ICD-10-CM | POA: Diagnosis not present

## 2018-11-14 DIAGNOSIS — M0609 Rheumatoid arthritis without rheumatoid factor, multiple sites: Secondary | ICD-10-CM | POA: Diagnosis not present

## 2018-11-14 DIAGNOSIS — Z86711 Personal history of pulmonary embolism: Secondary | ICD-10-CM | POA: Insufficient documentation

## 2018-11-14 DIAGNOSIS — M79604 Pain in right leg: Secondary | ICD-10-CM

## 2018-11-14 DIAGNOSIS — M545 Low back pain, unspecified: Secondary | ICD-10-CM | POA: Diagnosis present

## 2018-11-14 DIAGNOSIS — I5042 Chronic combined systolic (congestive) and diastolic (congestive) heart failure: Secondary | ICD-10-CM | POA: Diagnosis present

## 2018-11-14 DIAGNOSIS — R29898 Other symptoms and signs involving the musculoskeletal system: Secondary | ICD-10-CM

## 2018-11-14 DIAGNOSIS — I824Z1 Acute embolism and thrombosis of unspecified deep veins of right distal lower extremity: Secondary | ICD-10-CM

## 2018-11-14 DIAGNOSIS — M6281 Muscle weakness (generalized): Secondary | ICD-10-CM | POA: Diagnosis not present

## 2018-11-14 DIAGNOSIS — I959 Hypotension, unspecified: Secondary | ICD-10-CM | POA: Diagnosis not present

## 2018-11-14 DIAGNOSIS — R509 Fever, unspecified: Secondary | ICD-10-CM

## 2018-11-14 DIAGNOSIS — Z7901 Long term (current) use of anticoagulants: Secondary | ICD-10-CM | POA: Diagnosis not present

## 2018-11-14 DIAGNOSIS — M549 Dorsalgia, unspecified: Secondary | ICD-10-CM

## 2018-11-14 DIAGNOSIS — R531 Weakness: Secondary | ICD-10-CM | POA: Diagnosis not present

## 2018-11-14 DIAGNOSIS — Z79899 Other long term (current) drug therapy: Secondary | ICD-10-CM | POA: Insufficient documentation

## 2018-11-14 DIAGNOSIS — I252 Old myocardial infarction: Secondary | ICD-10-CM | POA: Diagnosis not present

## 2018-11-14 DIAGNOSIS — R600 Localized edema: Secondary | ICD-10-CM

## 2018-11-14 DIAGNOSIS — M069 Rheumatoid arthritis, unspecified: Secondary | ICD-10-CM

## 2018-11-14 DIAGNOSIS — R000 Tachycardia, unspecified: Secondary | ICD-10-CM | POA: Diagnosis not present

## 2018-11-14 DIAGNOSIS — Z79891 Long term (current) use of opiate analgesic: Secondary | ICD-10-CM | POA: Insufficient documentation

## 2018-11-14 DIAGNOSIS — E1169 Type 2 diabetes mellitus with other specified complication: Secondary | ICD-10-CM | POA: Diagnosis present

## 2018-11-14 LAB — BASIC METABOLIC PANEL
Anion gap: 7 (ref 5–15)
BUN: 15 mg/dL (ref 8–23)
CO2: 24 mmol/L (ref 22–32)
Calcium: 8.6 mg/dL — ABNORMAL LOW (ref 8.9–10.3)
Chloride: 106 mmol/L (ref 98–111)
Creatinine, Ser: 0.9 mg/dL (ref 0.44–1.00)
GFR calc Af Amer: >60 mL/min (ref >60)
GFR calc non Af Amer: 58 mL/min — ABNORMAL LOW (ref >60)
Glucose, Bld: 94 mg/dL (ref 70–99)
Potassium: 4.1 mmol/L (ref 3.5–5.1)
Sodium: 137 mmol/L (ref 135–145)

## 2018-11-14 LAB — CBC WITH DIFFERENTIAL/PLATELET
Abs Immature Granulocytes: 0.02 10*3/uL (ref 0.00–0.07)
Basophils Absolute: 0.1 10*3/uL (ref 0.0–0.1)
Basophils Relative: 1 %
Eosinophils Absolute: 0.2 10*3/uL (ref 0.0–0.5)
Eosinophils Relative: 4 %
HCT: 31.6 % — ABNORMAL LOW (ref 36.0–46.0)
Hemoglobin: 10.1 g/dL — ABNORMAL LOW (ref 12.0–15.0)
Immature Granulocytes: 0 %
Lymphocytes Relative: 23 %
Lymphs Abs: 1.5 10*3/uL (ref 0.7–4.0)
MCH: 31.6 pg (ref 26.0–34.0)
MCHC: 32 g/dL (ref 30.0–36.0)
MCV: 98.8 fL (ref 80.0–100.0)
Monocytes Absolute: 0.6 10*3/uL (ref 0.1–1.0)
Monocytes Relative: 9 %
Neutro Abs: 4.1 10*3/uL (ref 1.7–7.7)
Neutrophils Relative %: 63 %
Platelets: 272 10*3/uL (ref 150–400)
RBC: 3.2 MIL/uL — ABNORMAL LOW (ref 3.87–5.11)
RDW: 15.7 % — ABNORMAL HIGH (ref 11.5–15.5)
WBC: 6.4 10*3/uL (ref 4.0–10.5)
nRBC: 0 % (ref 0.0–0.2)

## 2018-11-14 LAB — URINALYSIS, ROUTINE W REFLEX MICROSCOPIC
Bilirubin Urine: NEGATIVE
Glucose, UA: NEGATIVE mg/dL
Hgb urine dipstick: NEGATIVE
Ketones, ur: NEGATIVE mg/dL
Leukocytes, UA: NEGATIVE
Nitrite: NEGATIVE
Protein, ur: NEGATIVE mg/dL
Specific Gravity, Urine: 1.012 (ref 1.005–1.030)
pH: 6 (ref 5.0–8.0)

## 2018-11-14 LAB — I-STAT CG4 LACTIC ACID, ED
Lactic Acid, Venous: 0.65 mmol/L (ref 0.5–1.9)
Lactic Acid, Venous: 0.8 mmol/L (ref 0.5–1.9)

## 2018-11-14 LAB — TSH: TSH: 1.516 u[IU]/mL (ref 0.350–4.500)

## 2018-11-14 LAB — SEDIMENTATION RATE: Sed Rate: 8 mm/hr (ref 0–22)

## 2018-11-14 LAB — PROTIME-INR
INR: 1.43
Prothrombin Time: 17.2 seconds — ABNORMAL HIGH (ref 11.4–15.2)

## 2018-11-14 MED ORDER — OXYCODONE-ACETAMINOPHEN 5-325 MG PO TABS
1.0000 | ORAL_TABLET | Freq: Once | ORAL | Status: AC
  Administered 2018-11-14: 1 via ORAL
  Filled 2018-11-14: qty 1

## 2018-11-14 NOTE — Telephone Encounter (Signed)
Copied from CRM [id number]. Topic: Quick Communication - Home Health Verbal Orders >> Nov 14, 2018  9:44 AM Maia Petties wrote: Caller/Agency: Shon Hale PT with [facility] Number: [phone], secure VM if no answer Requesting OT/PT/Skilled Nursing/Social Work: PT Frequency: requesting VO for PT 2x week for 4 weeks

## 2018-11-14 NOTE — Telephone Encounter (Signed)
Orders have been placed for [facility]. Pt daughter will be notified.

## 2018-11-14 NOTE — Telephone Encounter (Signed)
Copied from CRM [id number]. Topic: Quick Communication - See Telephone Encounter >> Nov 14, 2018  8:50 AM Arlyss Gandy, NT wrote: CRM for notification. See Telephone encounter for: 11/14/18. Pts daughter, [name] [name] calling to request an order for her mom to have nursing care with home health. She states her mom would benefit from this care. She states her mom is still having pain in her right leg and having to take pain meds for this. She would like to what the doctor advises for her to do as her mom is unable to bear weight on her right leg.

## 2018-11-14 NOTE — Telephone Encounter (Signed)
B/P 199/100 Pulse 97 just a few moments ago / Has taken tramadol today but no improvement.  Patient was going to take the oxycodone last night / Right leg not swollen, just painful.  Per daughter patient is unable to bear weight on the leg.  Hx: rheumatoid arthritis.  Patient has an appointment for f/u with specialist on 11/21/2018 / Patient does have Hypertension and takes daily medications.  Per patient she has not missed any medications. This RN advised that increased BP may be due to pain since patient is not really taking the oxycodone.  Daughter said she is not around the patient enough to know if when the pain decreases the BP descreases / Appointment 11-16-18 with Dr. Earlene Plater Reason for Disposition . [1] Systolic BP  >= 130 OR Diastolic >= 80 AND [2] taking BP medications  Answer Assessment - Initial Assessment Questions 1. ONSET: "When did the pain start?"      On going for about 2-3 weeks 2. LOCATION: "Where is the pain located?"      Right leg 3. PAIN: "How bad is the pain?"    (Scale 1-10; or mild, moderate, severe)    -  SEVERE (8-10): excruciating pain, unable to do any normal activities, unable to walk     5. CAUSE: "What do you think is causing the leg pain?"  not sure 6. OTHER SYMPTOMS: "Do you have any other symptoms?" (e.g., chest pain, back pain, breathing difficulty, swelling, rash, fever, numbness, weakness) Rheumatoid and spinal stanosis  Answer Assessment - Initial Assessment Questions 1. BLOOD PRESSURE: "What is the blood pressure?" "Did you take at least two measurements 5 minutes apart?"     199/100 2. ONSET: "When did you take your blood pressure?"  This morning 3. HOW: "How did you obtain the blood pressure?" (e.g., visiting nurse, automatic home BP monitor)  Home BP 4. HISTORY: "Do you have a history of high blood pressure?"  yes  6. OTHER SYMPTOMS: "Do you have any symptoms?" (e.g., headache, chest pain, blurred vision, difficulty breathing,  weakness) no  Protocols used: HIGH BLOOD PRESSURE-A-AH, LEG PAIN-A-AH

## 2018-11-14 NOTE — Telephone Encounter (Signed)
See note

## 2018-11-14 NOTE — Telephone Encounter (Signed)
Okay for verbal orders? Please advise 

## 2018-11-14 NOTE — Telephone Encounter (Signed)
Verbal orders given to [facility] via confidential vm.

## 2018-11-14 NOTE — Telephone Encounter (Signed)
Please check in with them. Absolutely okay for orders and please make priority.

## 2018-11-14 NOTE — ED Triage Notes (Signed)
Per EMS: pt from home with c/o weakness and pain to right leg which is a chronic issue for her but has worsened today.  Pt has taken tramadol with no relief, then took an Oxycodone at 0930 this AM around 0930.  Pt currently states she is pain  free. EMS also notes pt hypertensive.  Initially 230/110

## 2018-11-14 NOTE — Telephone Encounter (Signed)
Tried to call pt daughter for update no answer.

## 2018-11-14 NOTE — ED Provider Notes (Addendum)
[facility] Provider Note   CSN: [id number] Arrival date & time: 11/14/18  1538     History   Chief Complaint No chief complaint on file.   HPI [name] [name] is a 85 y.o. female.  HPI Patient has had ongoing problems with severe back pain and gait limitation due to rheumatoid arthritis and spinal stenosis.  Patient was admitted to the hospital 12\13 and discharged 12\15 to [facility].  It appears patient was there for 2 days and discharged with plan for home health OT\PT.  Apparently, PT OT was supposed to start 12\30.  Review of EMR indicates patient's daughter and caregiver was calling PCP office 12\26 due to problems with pain control and decreased mobility.  Several calls on January 2 and 3 appear to show that physical therapy was planned but not started.  Patient reports that they were supposed to be coming to her house today at 5 PM.  She reports that her pain has been increasing and mobility has been decreasing.  She has pain in the back and both legs but the pain on the right is worse.  She reports that the right leg often is much weaker as well.  She does live alone in a senior apartment and is now having difficulty transferring to get up and perform usual activities.  She reports the pain medication helps sometimes she did try an oxycodone today and there has been some alleviation but pain remains fairly severe.  Denies she has had pain or burning with urination.  She denies loss of continence of her bowels or bladder.  No abdominal pain or vomiting. Past Medical History:  Diagnosis Date  . Allergic rhinitis due to pollen 04/27/2007  . Arthritis    "in q joint" (08/07/2013)  . CHF (congestive heart failure) (HCC)   . Coronary atherosclerosis of native coronary artery   . Cough   . Disorder of bone and cartilage, unspecified   . Edema 05/03/2013  . Exertional shortness of breath    "sometimes" (08/07/2013)  . First degree  atrioventricular block   . Gout, unspecified 04/19/2013  . Hyperlipidemia 04/27/2007  . Hypertension   . Midline low back pain without sciatica 06/27/2014  . Muscle spasm   . Muscle weakness (generalized)   . Myocardial infarction (HCC) ~ 1970  . Onychia and paronychia of toe   . Osteoarthrosis, unspecified whether generalized or localized, unspecified site 04/27/2007  . Other fall   . Other malaise and fatigue   . Pneumonia 05/2018  . Prepatellar bursitis   . Pulmonary embolism (HCC) 07/2018  . Rheumatic nodule 06/28/2017  . Seizures (HCC)   . Stroke (HCC) 01/17/2014  . TIA (transient ischemic attack)    "a few one summer" (08/07/2013)  . Type II diabetes mellitus (HCC)    "fasting 90-110s" (08/07/2013)  . Unspecified essential hypertension 08/08/2013  . Unspecified vitamin D deficiency     Patient Active Problem List   Diagnosis Date Noted  . Chronic back pain 10/27/2018  . Arthralgia of both knees 10/27/2018  . Rheumatoid arthritis involving both knees (HCC) 10/27/2018  . Acute pain 10/21/2018  . Physical deconditioning 09/06/2018  . Hyperlipidemia associated with type 2 diabetes mellitus (HCC), refuses statin 09/06/2018  . Refusal of statin medication by patient 09/04/2018  . Deep vein thrombosis (DVT) of distal vein of right lower extremity (HCC) 07/30/2018  . Hypertensive heart disease with heart failure (HCC) 07/30/2018  . Pulmonary embolism (HCC), 07/2018, on  Xarelto with goal to DC after 6 months if more active 07/10/2018  . DCM (dilated cardiomyopathy) (HCC)   . Benign hypertensive heart and kidney disease with CHF and stage 3 chronic kidney disease (HCC) 05/28/2018  . Osteoarthritis 05/06/2018  . Hypokalemia 02/02/2018  . Adhesive capsulitis of right shoulder 10/12/2017  . Idiopathic chronic gout of multiple sites with tophus 08/25/2017  . History of total knee arthroplasty, bilateral 02/18/2017  . History of rotator cuff surgery 02/18/2017  . High risk  medications, long-term use 09/21/2016  . Chronic combined systolic and diastolic CHF (congestive heart failure) (HCC) 09/12/2015  . Bilateral edema of lower extremity 06/05/2014  . CKD stage 3 due to type 2 diabetes mellitus (HCC) 06/05/2014  . DM (diabetes mellitus), type 2 with renal complications (HCC) 06/05/2014    Class: Chronic  . Anemia of chronic disease 05/01/2014  . Rheumatoid arthritis involving multiple sites (HCC) 12/19/2013    Class: Chronic  . CAD (coronary artery disease) 10/18/2013  . Hypertension associated with diabetes (HCC) 08/08/2013    Class: Chronic  . Fibromyalgia 05/10/2013  . Type 2 diabetes mellitus with hyperlipidemia (HCC), patient declines statin 04/27/2007  . Allergic rhinitis 04/27/2007  . Diverticulosis 04/27/2007    Past Surgical History:  Procedure Laterality Date  . ABDOMINAL HYSTERECTOMY  04/1980  . APPENDECTOMY  1960  . CARDIAC CATHETERIZATION  2003  . CATARACT EXTRACTION W/ INTRAOCULAR LENS  IMPLANT, BILATERAL Bilateral ~ 2012  . CHOLECYSTECTOMY N/A 06/26/2018   Procedure: LAPAROSCOPIC CHOLECYSTECTOMY POSSIBLE INTRAOPERATIVE CHOLANGIOGRAM;  Surgeon: Abigail MiyamotoBlackman, Douglas, MD;  Location: [facility] OR;  Service: General;  Laterality: N/A;  . JOINT REPLACEMENT    . REPLACEMENT TOTAL KNEE Right 09/2005  . RIGHT/LEFT HEART CATH AND CORONARY ANGIOGRAPHY N/A 06/01/2018   Procedure: RIGHT/LEFT HEART CATH AND CORONARY ANGIOGRAPHY;  Surgeon: Corky CraftsVaranasi, Jayadeep S, MD;  [facility];  Service: Cardiovascular;  Laterality: N/A;  . SHOULDER OPEN ROTATOR CUFF REPAIR Right 07/1999  . TONSILLECTOMY  08/1974  . TOTAL KNEE ARTHROPLASTY Left 08/07/2013   Procedure: TOTAL KNEE ARTHROPLASTY- LEFT;  Surgeon: Dannielle HuhSteve Lucey, MD;  Location: [facility] OR;  Service: Orthopedics;  Laterality: Left;  . TRANSTHORACIC ECHOCARDIOGRAM  2003   EF 55-65%; mild concentric LVH     OB History   No obstetric history on file.      Home Medications    Prior to Admission medications    Medication Sig Start Date End Date Taking? Authorizing Provider  albuterol (PROVENTIL HFA;VENTOLIN HFA) 108 (90 Base) MCG/ACT inhaler Inhale 1-2 puffs into the lungs every 6 (six) hours as needed for wheezing or shortness of breath. 10/28/18  Yes Margit HanksAlexander, Anne D, MD  allopurinol (ZYLOPRIM) 100 MG tablet Take 1 tablet (100 mg total) by mouth at bedtime. 10/28/18  Yes Margit HanksAlexander, Anne D, MD  carvedilol (COREG) 25 MG tablet Take 25 mg by mouth 2 (two) times daily with a meal.   Yes [provider]  D3-1000 25 MCG (1000 UT) capsule Take 1 capsule (1,000 Units total) by mouth daily. 10/28/18  Yes Margit HanksAlexander, Anne D, MD  diclofenac sodium (VOLTAREN) 1 % GEL Apply 2 g topically 4 (four) times daily as needed. Patient taking differently: Apply 2 g topically daily as needed (pain).  10/28/18  Yes Margit HanksAlexander, Anne D, MD  ENSURE (ENSURE) Take 237 mLs by mouth every evening.    Yes [provider]  furosemide (LASIX) 40 MG tablet Take 0.5 tablets (20 mg total) by mouth daily. 10/28/18  Yes Margit HanksAlexander, Anne D,  MD  gabapentin (NEURONTIN) 100 MG capsule Take 1 capsule (100 mg total) by mouth at bedtime. 10/28/18  Yes Margit Hanks, MD  hydrALAZINE (APRESOLINE) 100 MG tablet Take 1 tablet (100 mg total) by mouth 3 (three) times daily. 10/28/18  Yes Margit Hanks, MD  hydroxychloroquine (PLAQUENIL) 200 MG tablet Take 1 tablet (200 mg total) by mouth 2 (two) times daily. 10/28/18  Yes Margit Hanks, MD  hydroxypropyl methylcellulose / hypromellose (ISOPTO TEARS / GONIOVISC) 2.5 % ophthalmic solution Place 1 drop into both eyes daily. 10/28/18  Yes Margit Hanks, MD  isosorbide mononitrate (IMDUR) 30 MG 24 hr tablet Take 1 tablet (30 mg total) by mouth at bedtime. 10/28/18  Yes Margit Hanks, MD  leflunomide (ARAVA) 20 MG tablet Take 1 tablet (20 mg total) by mouth daily. 10/28/18  Yes Margit Hanks, MD  Olopatadine HCl 0.2 % SOLN Place 1 drop into both eyes daily. 10/28/18   Yes Margit Hanks, MD  predniSONE (DELTASONE) 10 MG tablet Take 1 tablet (10 mg total) by mouth daily. 10/28/18  Yes Margit Hanks, MD  rivaroxaban (XARELTO) 20 MG TABS tablet Take 1 tablet (20 mg total) by mouth daily with supper. 10/28/18  Yes Margit Hanks, MD  sacubitril-valsartan (ENTRESTO) 49-51 MG Take 1 tablet by mouth daily at 12 noon. 10/28/18  Yes Margit Hanks, MD  traMADol (ULTRAM) 50 MG tablet Take 2 tablets (100 mg total) by mouth every 6 (six) hours for 7 days. Patient taking differently: Take 50 mg by mouth every 6 (six) hours as needed for moderate pain.  10/28/18 11/14/18 Yes Margit Hanks, MD  triamcinolone cream (KENALOG) 0.1 % Apply 1 application topically 2 (two) times daily as needed (for itching- to affected sites). 10/28/18  Yes Margit Hanks, MD  vitamin C (ASCORBIC ACID) 500 MG tablet Take 1 tablet (500 mg total) by mouth daily. 10/28/18  Yes Margit Hanks, MD  carvedilol (COREG) 6.25 MG tablet Take 1 tablet (6.25 mg total) by mouth 2 (two) times daily with a meal. Patient not taking: Reported on 11/14/2018 10/28/18   Margit Hanks, MD  potassium chloride SA (K-DUR,KLOR-CON) 20 MEQ tablet Take 1 tablet (20 mEq total) by mouth 2 (two) times daily. Patient not taking: Reported on 11/14/2018 10/28/18   Margit Hanks, MD    Family History Family History  Problem Relation Age of Onset  . Heart attack Father     Social History Social History   Tobacco Use  . Smoking status: Never Smoker  . Smokeless tobacco: Never Used  Substance Use Topics  . Alcohol use: No    Alcohol/week: 0.0 standard drinks  . Drug use: No     Allergies   Clonidine derivatives; Fish allergy; Shellfish allergy; Doxycycline; Indomethacin; Lyrica [pregabalin]; Methyldopa; Morphine and related; Orange fruit [citrus]; Strawberry (diagnostic); Cetirizine hcl; Codeine; Levaquin [levofloxacin in d5w]; and Tomato   Review of Systems Review of Systems 10 Systems  reviewed and are negative for acute change except as noted in the HPI.  Physical Exam Updated Vital Signs BP (!) 164/93   Pulse (!) 108   Temp 98.4 F (36.9 C) (Oral)   Resp 14   Ht 5\' 4"  (1.626 m)   Wt 78.9 kg   SpO2 98%   BMI 29.86 kg/m   Physical Exam Constitutional:      Comments: Patient is alert and appropriate.  She is giving full history.  She is deconditioned in appearance.  No respiratory distress.  HENT:     Head: Normocephalic and atraumatic.     Mouth/Throat:     Mouth: Mucous membranes are moist.  Eyes:     Extraocular Movements: Extraocular movements intact.  Cardiovascular:     Rate and Rhythm: Normal rate and regular rhythm.  Pulmonary:     Effort: Pulmonary effort is normal.     Breath sounds: Normal breath sounds.  Abdominal:     General: There is no distension.     Palpations: Abdomen is soft.     Tenderness: There is no abdominal tenderness. There is no guarding.  Musculoskeletal:     Comments: Patient is able to be assisted into an upright sitting position.  This does cause discomfort but once in position she can help to maintain by holding the bed rails.  No focal bony point tenderness over the spine.  [name] lower extremities do not have peripheral edema.  She has significant arthritic and surgical changes old of the knees.  He has healed old scars over the lower legs which she reports was a bad infection or lesions related to her rheumatoid arthritis.  She reports that the time is very severe but now does not cause her much problem.  Dorsalis pedis pulses are 2+ and symmetric.  Patient is able to independently lift each leg off of the bed.  She does not have much strength to resist downward pressure.  Pain is not specifically elicited by straight leg raise to 30 degrees.  She does get pain in the hip and low back with any internal and external rotation of the right lower extremity.  Neurological:     General: No focal deficit present.     Mental  Status: She is oriented to person, place, and time.  Psychiatric:        Mood and Affect: Mood normal.   ED Treatments / Results  Labs (all labs ordered are listed, but only abnormal results are displayed) Labs Reviewed  BASIC METABOLIC PANEL - Abnormal; Notable for the following components:      Result Value   Calcium 8.6 (*)    GFR calc non Af Amer 58 (*)    All other components within normal limits  CBC WITH DIFFERENTIAL/PLATELET - Abnormal; Notable for the following components:   RBC 3.20 (*)    Hemoglobin 10.1 (*)    HCT 31.6 (*)    RDW 15.7 (*)    All other components within normal limits  PROTIME-INR - Abnormal; Notable for the following components:   Prothrombin Time 17.2 (*)    All other components within normal limits  SEDIMENTATION RATE  TSH  URINALYSIS, ROUTINE W REFLEX MICROSCOPIC    EKG EKG Interpretation  Date/Time:  Monday November 14 2018 15:54:45 EST Ventricular Rate:  102 PR Interval:    QRS Duration: 93 QT Interval:  386 QTC Calculation: 503 R Axis:   -18 Text Interpretation:  Sinus tachycardia Multiple premature complexes, vent & supraven Borderline left axis deviation Posterior infarct, old Prolonged QT interval no sig change from previous Confirmed by Arby Barrette [phone]) on 11/14/2018 6:02:37 PM   Radiology No results found.  Procedures Procedures (including critical care time)  Medications Ordered in ED Medications  oxyCODONE-acetaminophen (PERCOCET/ROXICET) 5-325 MG per tablet 1 tablet (1 tablet Oral Given 11/14/18 1933)     Initial Impression / Assessment and Plan / ED Course  I have reviewed the triage vital signs and the nursing notes.  Pertinent labs & imaging results  that were available during my care of the patient were reviewed by me and considered in my medical decision making (see chart for details).    Patient has had declining function at home.  Previously she was able to stand and ambulate for basic self-care and transfers.   Over the past several days now pain has intensified in the right lower extremity as well as increasing weakness.  She has known spinal stenosis also rheumatoid arthritis.  Was recently hospitalized for pain control and briefly sent to rehabilitation.  Trial has been made to put patient on bedpan and stand for transfer.  Patient has been unable to urinate and bladder shows 450 cc will straight cath for urine and consult for readmission for ambulatory dysfunction and increasing right lower extremity weakness with known spinal stenosis.  [name] reports that at this point she is requiring 24-hour care and assistance for activities of daily living.  Patient will also need increased plans for pain management.  Recheck: [name] mental status remains alert and appropriate.  No respiratory distress.  Now repeat temp is 100.9.  Nursing staff has obtained urine by catheterization.  Will add urine culture and blood culture and lactic acid.  Patient however does not have hypotension or leukocytosis.  [facility] is being [facility] and being seen by Dr. Toniann Fail.  At this time, will not initiate sepsis protocol.  Final Clinical Impressions(s) / ED Diagnoses   Final diagnoses:  Intractable back pain  Weakness of right lower extremity  Rheumatoid arthritis involving multiple sites, unspecified rheumatoid factor presence (HCC)  Spinal stenosis, unspecified spinal region    ED Discharge Orders    None       Arby Barrette, MD 11/14/18 2017    Arby Barrette, MD 11/14/18 2023

## 2018-11-14 NOTE — Telephone Encounter (Signed)
Pt daughter Galen Daft  calling back stating that they need a  [facility] to do in home care on mother she is in a lot of pain she would like a call back at [phone]

## 2018-11-15 ENCOUNTER — Observation Stay (HOSPITAL_COMMUNITY): Payer: Medicare Other

## 2018-11-15 ENCOUNTER — Encounter (HOSPITAL_COMMUNITY): Payer: Self-pay | Admitting: Internal Medicine

## 2018-11-15 DIAGNOSIS — M545 Low back pain: Secondary | ICD-10-CM

## 2018-11-15 DIAGNOSIS — E1122 Type 2 diabetes mellitus with diabetic chronic kidney disease: Secondary | ICD-10-CM

## 2018-11-15 DIAGNOSIS — J984 Other disorders of lung: Secondary | ICD-10-CM | POA: Diagnosis not present

## 2018-11-15 DIAGNOSIS — N183 Chronic kidney disease, stage 3 (moderate): Secondary | ICD-10-CM

## 2018-11-15 DIAGNOSIS — E1169 Type 2 diabetes mellitus with other specified complication: Secondary | ICD-10-CM

## 2018-11-15 DIAGNOSIS — S32010A Wedge compression fracture of first lumbar vertebra, initial encounter for closed fracture: Secondary | ICD-10-CM | POA: Diagnosis not present

## 2018-11-15 DIAGNOSIS — M10031 Idiopathic gout, right wrist: Secondary | ICD-10-CM

## 2018-11-15 DIAGNOSIS — M109 Gout, unspecified: Secondary | ICD-10-CM | POA: Diagnosis present

## 2018-11-15 DIAGNOSIS — M4800 Spinal stenosis, site unspecified: Secondary | ICD-10-CM

## 2018-11-15 DIAGNOSIS — M069 Rheumatoid arthritis, unspecified: Secondary | ICD-10-CM

## 2018-11-15 DIAGNOSIS — E785 Hyperlipidemia, unspecified: Secondary | ICD-10-CM

## 2018-11-15 LAB — GLUCOSE, CAPILLARY
Glucose-Capillary: 67 mg/dL — ABNORMAL LOW (ref 70–99)
Glucose-Capillary: 124 mg/dL — ABNORMAL HIGH (ref 70–99)
Glucose-Capillary: 211 mg/dL — ABNORMAL HIGH (ref 70–99)
Glucose-Capillary: 168 mg/dL — ABNORMAL HIGH (ref 70–99)
Glucose-Capillary: 171 mg/dL — ABNORMAL HIGH (ref 70–99)
Glucose-Capillary: 217 mg/dL — ABNORMAL HIGH (ref 70–99)

## 2018-11-15 LAB — COMPREHENSIVE METABOLIC PANEL
ALT: 12 U/L (ref 0–44)
AST: 18 U/L (ref 15–41)
Albumin: 2.8 g/dL — ABNORMAL LOW (ref 3.5–5.0)
Alkaline Phosphatase: 45 U/L (ref 38–126)
Anion gap: 8 (ref 5–15)
BUN: 12 mg/dL (ref 8–23)
CO2: 24 mmol/L (ref 22–32)
Calcium: 8.4 mg/dL — ABNORMAL LOW (ref 8.9–10.3)
Chloride: 103 mmol/L (ref 98–111)
Creatinine, Ser: 1.01 mg/dL — ABNORMAL HIGH (ref 0.44–1.00)
GFR calc Af Amer: 58 mL/min — ABNORMAL LOW (ref >60)
GFR calc non Af Amer: 50 mL/min — ABNORMAL LOW (ref >60)
Glucose, Bld: 80 mg/dL (ref 70–99)
Potassium: 3.3 mmol/L — ABNORMAL LOW (ref 3.5–5.1)
Sodium: 135 mmol/L (ref 135–145)
Total Bilirubin: 1 mg/dL (ref 0.3–1.2)
Total Protein: 5.4 g/dL — ABNORMAL LOW (ref 6.5–8.1)

## 2018-11-15 LAB — BLOOD GAS, ARTERIAL
Acid-Base Excess: 0.8 mmol/L (ref 0.0–2.0)
Bicarbonate: 24.2 mmol/L (ref 20.0–28.0)
Drawn by: 448981
FIO2: 21
O2 Saturation: 94.3 %
Patient temperature: 97.3
pCO2 arterial: 33.3 mmHg (ref 32.0–48.0)
pH, Arterial: 7.471 — ABNORMAL HIGH (ref 7.350–7.450)
pO2, Arterial: 78.2 mmHg — ABNORMAL LOW (ref 83.0–108.0)

## 2018-11-15 LAB — MAGNESIUM: Magnesium: 1.6 mg/dL — ABNORMAL LOW (ref 1.7–2.4)

## 2018-11-15 LAB — BASIC METABOLIC PANEL
Anion gap: 9 (ref 5–15)
BUN: 16 mg/dL (ref 8–23)
CO2: 24 mmol/L (ref 22–32)
Calcium: 8.6 mg/dL — ABNORMAL LOW (ref 8.9–10.3)
Chloride: 103 mmol/L (ref 98–111)
Creatinine, Ser: 1.16 mg/dL — ABNORMAL HIGH (ref 0.44–1.00)
GFR calc Af Amer: 49 mL/min — ABNORMAL LOW (ref >60)
GFR calc non Af Amer: 43 mL/min — ABNORMAL LOW (ref >60)
Glucose, Bld: 166 mg/dL — ABNORMAL HIGH (ref 70–99)
Potassium: 4.1 mmol/L (ref 3.5–5.1)
Sodium: 136 mmol/L (ref 135–145)

## 2018-11-15 LAB — CBC WITH DIFFERENTIAL/PLATELET
Abs Immature Granulocytes: 0.04 10*3/uL (ref 0.00–0.07)
Basophils Absolute: 0.1 10*3/uL (ref 0.0–0.1)
Basophils Relative: 1 %
Eosinophils Absolute: 0.2 10*3/uL (ref 0.0–0.5)
Eosinophils Relative: 2 %
HCT: 31.3 % — ABNORMAL LOW (ref 36.0–46.0)
Hemoglobin: 10.2 g/dL — ABNORMAL LOW (ref 12.0–15.0)
Immature Granulocytes: 0 %
Lymphocytes Relative: 18 %
Lymphs Abs: 1.9 10*3/uL (ref 0.7–4.0)
MCH: 31.9 pg (ref 26.0–34.0)
MCHC: 32.6 g/dL (ref 30.0–36.0)
MCV: 97.8 fL (ref 80.0–100.0)
Monocytes Absolute: 0.8 10*3/uL (ref 0.1–1.0)
Monocytes Relative: 8 %
Neutro Abs: 7.4 10*3/uL (ref 1.7–7.7)
Neutrophils Relative %: 71 %
Platelets: 238 10*3/uL (ref 150–400)
RBC: 3.2 MIL/uL — ABNORMAL LOW (ref 3.87–5.11)
RDW: 15.5 % (ref 11.5–15.5)
WBC: 10.5 10*3/uL (ref 4.0–10.5)
nRBC: 0 % (ref 0.0–0.2)

## 2018-11-15 LAB — URIC ACID: Uric Acid, Serum: 5 mg/dL (ref 2.5–7.1)

## 2018-11-15 LAB — CBC
HCT: 32.3 % — ABNORMAL LOW (ref 36.0–46.0)
Hemoglobin: 10.3 g/dL — ABNORMAL LOW (ref 12.0–15.0)
MCH: 30.5 pg (ref 26.0–34.0)
MCHC: 31.9 g/dL (ref 30.0–36.0)
MCV: 95.6 fL (ref 80.0–100.0)
Platelets: 226 10*3/uL (ref 150–400)
RBC: 3.38 MIL/uL — ABNORMAL LOW (ref 3.87–5.11)
RDW: 15.1 % (ref 11.5–15.5)
WBC: 10 10*3/uL (ref 4.0–10.5)
nRBC: 0 % (ref 0.0–0.2)

## 2018-11-15 LAB — TSH: TSH: 1.095 u[IU]/mL (ref 0.350–4.500)

## 2018-11-15 LAB — AMMONIA: Ammonia: 10 umol/L (ref 9–35)

## 2018-11-15 MED ORDER — SODIUM CHLORIDE 0.9 % IV SOLN
1.0000 g | INTRAVENOUS | Status: DC
  Administered 2018-11-15 – 2018-11-16 (×2): 1 g via INTRAVENOUS
  Filled 2018-11-15 (×3): qty 10

## 2018-11-15 MED ORDER — ACETAMINOPHEN 325 MG PO TABS
650.0000 mg | ORAL_TABLET | Freq: Four times a day (QID) | ORAL | Status: DC | PRN
  Administered 2018-11-15: 650 mg via ORAL
  Filled 2018-11-15: qty 2

## 2018-11-15 MED ORDER — LEFLUNOMIDE 20 MG PO TABS
20.0000 mg | ORAL_TABLET | Freq: Every day | ORAL | Status: DC
  Administered 2018-11-15 – 2018-11-17 (×3): 20 mg via ORAL
  Filled 2018-11-15 (×3): qty 1

## 2018-11-15 MED ORDER — SODIUM CHLORIDE 0.9 % IV SOLN
500.0000 mg | INTRAVENOUS | Status: DC
  Administered 2018-11-15 – 2018-11-16 (×2): 500 mg via INTRAVENOUS
  Filled 2018-11-15 (×2): qty 500

## 2018-11-15 MED ORDER — DICLOFENAC SODIUM 1 % TD GEL: 2.0000 g | Freq: Every day | TRANSDERMAL | Status: DC | PRN

## 2018-11-15 MED ORDER — VITAMIN D 25 MCG (1000 UNIT) PO TABS
1000.0000 [IU] | ORAL_TABLET | Freq: Every day | ORAL | Status: DC
  Administered 2018-11-15 – 2018-11-17 (×3): 1000 [IU] via ORAL
  Filled 2018-11-15 (×3): qty 1

## 2018-11-15 MED ORDER — GABAPENTIN 100 MG PO CAPS
100.0000 mg | ORAL_CAPSULE | Freq: Every day | ORAL | Status: DC
  Administered 2018-11-15 – 2018-11-16 (×2): 100 mg via ORAL
  Filled 2018-11-15 (×2): qty 1

## 2018-11-15 MED ORDER — ACETAMINOPHEN 650 MG RE SUPP: 650.0000 mg | Freq: Four times a day (QID) | RECTAL | Status: DC | PRN

## 2018-11-15 MED ORDER — ENSURE ENLIVE PO LIQD
237.0000 mL | Freq: Every evening | ORAL | Status: DC
  Administered 2018-11-16: 237 mL via ORAL
  Filled 2018-11-15: qty 237

## 2018-11-15 MED ORDER — SACUBITRIL-VALSARTAN 49-51 MG PO TABS
1.0000 | ORAL_TABLET | Freq: Every day | ORAL | Status: DC
  Administered 2018-11-15: 1 via ORAL
  Filled 2018-11-15: qty 1

## 2018-11-15 MED ORDER — ALBUTEROL (5 MG/ML) CONTINUOUS INHALATION SOLN
2.5000 mg | INHALATION_SOLUTION | Freq: Four times a day (QID) | RESPIRATORY_TRACT | Status: DC | PRN
  Filled 2018-11-15: qty 20

## 2018-11-15 MED ORDER — ISOSORBIDE MONONITRATE ER 30 MG PO TB24
30.0000 mg | ORAL_TABLET | Freq: Every day | ORAL | Status: DC
  Administered 2018-11-15 – 2018-11-16 (×2): 30 mg via ORAL
  Filled 2018-11-15 (×2): qty 1

## 2018-11-15 MED ORDER — SODIUM CHLORIDE 0.9 % IV SOLN
INTRAVENOUS | Status: DC
  Administered 2018-11-15: 19:00:00 via INTRAVENOUS

## 2018-11-15 MED ORDER — ONDANSETRON HCL 4 MG/2ML IJ SOLN: 4.0000 mg | Freq: Four times a day (QID) | INTRAMUSCULAR | Status: DC | PRN

## 2018-11-15 MED ORDER — MAGNESIUM SULFATE 2 GM/50ML IV SOLN
2.0000 g | Freq: Once | INTRAVENOUS | Status: AC
  Administered 2018-11-15: 2 g via INTRAVENOUS
  Filled 2018-11-15: qty 50

## 2018-11-15 MED ORDER — RIVAROXABAN 20 MG PO TABS
20.0000 mg | ORAL_TABLET | Freq: Every day | ORAL | Status: DC
  Administered 2018-11-16 – 2018-11-17 (×2): 20 mg via ORAL
  Filled 2018-11-15 (×3): qty 1

## 2018-11-15 MED ORDER — INSULIN ASPART 100 UNIT/ML ~~LOC~~ SOLN
0.0000 [IU] | Freq: Three times a day (TID) | SUBCUTANEOUS | Status: DC
  Administered 2018-11-15: 3 [IU] via SUBCUTANEOUS
  Administered 2018-11-15: 2 [IU] via SUBCUTANEOUS
  Administered 2018-11-16: 1 [IU] via SUBCUTANEOUS
  Administered 2018-11-16: 2 [IU] via SUBCUTANEOUS
  Administered 2018-11-16: 1 [IU] via SUBCUTANEOUS
  Administered 2018-11-17: 2 [IU] via SUBCUTANEOUS

## 2018-11-15 MED ORDER — ALLOPURINOL 100 MG PO TABS
100.0000 mg | ORAL_TABLET | Freq: Every day | ORAL | Status: DC
  Administered 2018-11-15 – 2018-11-16 (×2): 100 mg via ORAL
  Filled 2018-11-15 (×2): qty 1

## 2018-11-15 MED ORDER — FUROSEMIDE 20 MG PO TABS
20.0000 mg | ORAL_TABLET | Freq: Every day | ORAL | Status: DC
  Administered 2018-11-15: 20 mg via ORAL
  Filled 2018-11-15: qty 1

## 2018-11-15 MED ORDER — SENNA 8.6 MG PO TABS
1.0000 | ORAL_TABLET | Freq: Every day | ORAL | Status: DC
  Administered 2018-11-15 – 2018-11-17 (×4): 8.6 mg via ORAL
  Filled 2018-11-15 (×4): qty 1

## 2018-11-15 MED ORDER — HYDRALAZINE HCL 50 MG PO TABS
100.0000 mg | ORAL_TABLET | Freq: Three times a day (TID) | ORAL | Status: DC
  Administered 2018-11-15 – 2018-11-17 (×5): 100 mg via ORAL
  Filled 2018-11-15 (×7): qty 2

## 2018-11-15 MED ORDER — PREDNISONE 10 MG PO TABS
10.0000 mg | ORAL_TABLET | Freq: Every day | ORAL | Status: DC
  Administered 2018-11-15: 10 mg via ORAL
  Filled 2018-11-15: qty 1

## 2018-11-15 MED ORDER — ACETAMINOPHEN 500 MG PO TABS
1000.0000 mg | ORAL_TABLET | Freq: Three times a day (TID) | ORAL | Status: DC
  Administered 2018-11-15 – 2018-11-17 (×7): 1000 mg via ORAL
  Filled 2018-11-15 (×8): qty 2

## 2018-11-15 MED ORDER — ONDANSETRON HCL 4 MG PO TABS: 4.0000 mg | ORAL_TABLET | Freq: Four times a day (QID) | ORAL | Status: DC | PRN

## 2018-11-15 MED ORDER — HYDROXYCHLOROQUINE SULFATE 200 MG PO TABS
200.0000 mg | ORAL_TABLET | Freq: Two times a day (BID) | ORAL | Status: DC
  Administered 2018-11-15 – 2018-11-17 (×5): 200 mg via ORAL
  Filled 2018-11-15 (×6): qty 1

## 2018-11-15 MED ORDER — VITAMIN C 500 MG PO TABS
500.0000 mg | ORAL_TABLET | Freq: Every day | ORAL | Status: DC
  Administered 2018-11-15 – 2018-11-17 (×3): 500 mg via ORAL
  Filled 2018-11-15 (×3): qty 1

## 2018-11-15 MED ORDER — METHYLPREDNISOLONE SODIUM SUCC 125 MG IJ SOLR
40.0000 mg | Freq: Once | INTRAMUSCULAR | Status: AC
  Administered 2018-11-15: 40 mg via INTRAVENOUS
  Filled 2018-11-15: qty 2

## 2018-11-15 MED ORDER — PREDNISONE 20 MG PO TABS
20.0000 mg | ORAL_TABLET | Freq: Every day | ORAL | Status: DC
  Administered 2018-11-16 – 2018-11-17 (×2): 20 mg via ORAL
  Filled 2018-11-15 (×2): qty 1

## 2018-11-15 MED ORDER — POTASSIUM CHLORIDE CRYS ER 20 MEQ PO TBCR
40.0000 meq | EXTENDED_RELEASE_TABLET | Freq: Once | ORAL | Status: AC
  Administered 2018-11-15: 40 meq via ORAL
  Filled 2018-11-15: qty 2

## 2018-11-15 MED ORDER — CARVEDILOL 25 MG PO TABS
25.0000 mg | ORAL_TABLET | Freq: Two times a day (BID) | ORAL | Status: DC
  Administered 2018-11-15 – 2018-11-17 (×5): 25 mg via ORAL
  Filled 2018-11-15 (×6): qty 1

## 2018-11-15 MED ORDER — FENTANYL CITRATE (PF) 100 MCG/2ML IJ SOLN
25.0000 ug | INTRAMUSCULAR | Status: DC | PRN
  Administered 2018-11-15: 25 ug via INTRAVENOUS
  Filled 2018-11-15: qty 2

## 2018-11-15 MED ORDER — POLYVINYL ALCOHOL 1.4 % OP SOLN
1.0000 [drp] | Freq: Every day | OPHTHALMIC | Status: DC
  Administered 2018-11-15 – 2018-11-17 (×3): 1 [drp] via OPHTHALMIC
  Filled 2018-11-15: qty 15

## 2018-11-15 NOTE — Plan of Care (Signed)
  Problem: Pain Managment: Goal: General experience of comfort will improve Outcome: Progressing   Problem: Safety: Goal: Ability to remain free from injury will improve Outcome: Progressing   

## 2018-11-15 NOTE — Progress Notes (Signed)
Called to beside due to "changes in patient's mental status".  Dg chest negative.  ABG pending -- while abg was being drawn, patient awoke and was moving both arms, speaking in sentences and recognizing family members Suspect as she did not sleep at all over last few days and especially last night, she was tired. Labs/abg pending Doubt CVA -have held diuretics as perhaps dehydration is contributing-- NS 50 overnight  Marlin Canary DO

## 2018-11-15 NOTE — ED Notes (Signed)
Pt attempted to use BSC with 2x assist; Family at the bedside telling patient she could not get up but staff was assisting patient to pivot to the [facility]; pt just gave up trying after family constantly telling her she was unable to stand or use her legs; Pt placed back in bed and pt placed on bed pan; pt eventually was able to lift up her own hips for staff to slide bedpan under her; pt was unable to void at this time; states she feels she has to go but does not feel anything coming down; pt requested to be taken off bed pan; Peri care preformed and patient repositioned back in bed-Monique,RN

## 2018-11-15 NOTE — ED Notes (Signed)
Pt complaining that her right arm hurts very bad

## 2018-11-15 NOTE — ED Notes (Signed)
Pt family requesting pain meds be ordered for pt b/c pt can not take pain any longer; pt denied pain to RN when asked less than an hour ago; Md paged ; family was advised pt will be moving to floor momentarily-Monique,RN

## 2018-11-15 NOTE — Progress Notes (Addendum)
[id number] Fail, MD paged per pt's daughter, Zenia Resides, request due to pt's drowsiness and orientation status. Pt AxO X2 at this time. Pt unable to carry on a conversation - pt falls back to sleep. VSS. Awaiting MD response. Will continue to monitor.  1613: No response from Toniann Fail, MD. Benjamine Mola, MD paged. Orders to follow. Janette updated. Will continue to monitor.

## 2018-11-15 NOTE — ED Notes (Signed)
Pt will be transport to 5N when return from MRI-Monique,RN

## 2018-11-15 NOTE — Progress Notes (Signed)
Pt arrived to room 5N12 via bed. See assessment. Will continue to monitor.

## 2018-11-15 NOTE — Evaluation (Signed)
Physical Therapy Evaluation Patient Details Name: [name] [name] MRN: [id number] DOB: [birth year] Today's Date: 11/15/2018   History of Present Illness  [name] CluckHazel C [name] is a 85 y.o. female with history of rheumatoid arthritis, gout, diabetes mellitus type 2, diastolic CHF, PE on Xarelto, history of CVA, iron deficiency, hypertension, nonobstructive CAD was recently admitted last month for low back pain at the time MRI of the lumbar spine showed degenerative disc disease and patient was eventually discharged to rehab and then went home. Pt return with worsening back pain along with RLE weakness and is mildly febrile in ED.   Clinical Impression  Pt admitted with above diagnosis. Pt currently with functional limitations due to the deficits listed below (see PT Problem List). Pt very cold, shivering in bed, continues to c/o R writs pain and was unable to grasp bed rail to help turn to side. Mod A needed for bed mobility as well as SPT to recliner right beside bed, unable to ambulate safely due to LE weakness and general fatigue. Pt not safe to be alone day or night at this point, recommending SNF.  Pt will benefit from skilled PT to increase their independence and safety with mobility to allow discharge to the venue listed below.       Follow Up Recommendations SNF;Supervision/Assistance - 24 hour    Equipment Recommendations  None recommended by PT    Recommendations for Other Services OT consult     Precautions / Restrictions Precautions Precautions: Fall Restrictions Weight Bearing Restrictions: No      Mobility  Bed Mobility Overal bed mobility: Needs Assistance Bed Mobility: Supine to Sit     Supine to sit: Mod assist     General bed mobility comments: pt unable to grasp rail with R hand due to pain, assist to LE's and trunk to come to sitting, assist at hips to scoot to EOB  Transfers Overall transfer level: Needs assistance Equipment used: Rolling walker (2  wheeled) Transfers: Sit to/from [employer] CorporationStand;Stand Pivot Transfers Sit to Stand: Mod assist Stand pivot transfers: Mod assist       General transfer comment: mod A for power up to stand with therapist in front of pt. At first pt felt that she could not step feet and facilitation given at hips to move twd chair but then pt able to take small shuffling steps to turn to chair with mod support  Ambulation/Gait             General Gait Details: unable due to weakness  Stairs            Wheelchair Mobility    Modified Rankin (Stroke Patients Only)       Balance Overall balance assessment: Needs assistance Sitting-balance support: Feet supported Sitting balance-Leahy Scale: Poor Sitting balance - Comments: 1 LOB posterior in sitting Postural control: Posterior lean Standing balance support: Bilateral upper extremity supported;During functional activity Standing balance-Leahy Scale: Poor Standing balance comment: heavy reliance on UE support                             Pertinent Vitals/Pain Pain Assessment: Faces Faces Pain Scale: Hurts even more Pain Location: R wrist, back Pain Descriptors / Indicators: Grimacing;Sore Pain Intervention(s): Limited activity within patient's tolerance;Monitored during session    Home Living Family/patient expects to be discharged to:: Private residence Living Arrangements: Alone Available Help at Discharge: Personal care attendant;Available PRN/intermittently Type of Home: Apartment Home Access: Level entry  Home Layout: One level Home Equipment: Hand held shower head;Walker - 4 wheels;Walker - 2 wheels;Cane - single point;Tub bench;Wheelchair - manual;Grab bars - toilet;Bedside commode Additional Comments: Aide 7 days/week 9-1 but pt does not have consistent support at night and daughter reports that they need a longer term solution    Prior Function Level of Independence: Needs assistance   Gait / Transfers Assistance  Needed: Uses rollator for functional mobility  ADL's / Homemaking Assistance Needed: Aide assists with dressing in the morning, bathing, house work, and light meal prep. Pt has meals pre frozen from family and is able to microwave to feed self.        Hand Dominance   Dominant Hand: Right    Extremity/Trunk Assessment   Upper Extremity Assessment Upper Extremity Assessment: RUE deficits/detail RUE Deficits / Details: R wrist swollen and painful to touch and minimal use of that hand, gout flare vs arthritis RUE Sensation: WNL RUE Coordination: decreased fine motor;decreased gross motor    Lower Extremity Assessment Lower Extremity Assessment: RLE deficits/detail;LLE deficits/detail RLE Deficits / Details: hip flex 2+/5, knee ext 3+/5 but lacking full knee control in standing  RLE Sensation: WNL RLE Coordination: decreased gross motor LLE Deficits / Details: hip flex 3/5, knee ext 3+/5 LLE Sensation: WNL LLE Coordination: WNL    Cervical / Trunk Assessment Cervical / Trunk Assessment: Normal  Communication   Communication: No difficulties  Cognition Arousal/Alertness: Awake/alert Behavior During Therapy: WFL for tasks assessed/performed Overall Cognitive Status: Within Functional Limits for tasks assessed                                 General Comments: pt defers to daughter to answer many questions, appropriate with conversation but question mild cognitive deficits      General Comments General comments (skin integrity, edema, etc.): pt soiled of urine, purewick not working, gown changed in chair and wet linens removed from bed. NT aware    Exercises     Assessment/Plan    PT Assessment Patient needs continued PT services  PT Problem List Decreased strength;Decreased range of motion;Decreased activity tolerance;Decreased balance;Decreased mobility;Decreased coordination;Decreased cognition;Decreased knowledge of use of DME;Obesity;Pain       PT  Treatment Interventions DME instruction;Gait training;Functional mobility training;Therapeutic activities;Therapeutic exercise;Balance training;Neuromuscular re-education;Patient/family education    PT Goals (Current goals can be found in the Care Plan section)  Acute Rehab PT Goals Patient Stated Goal: be able to walk PT Goal Formulation: With patient/family Time For Goal Achievement: 11/29/18 Potential to Achieve Goals: Fair    Frequency Min 2X/week   Barriers to discharge Decreased caregiver support home alone at night    Co-evaluation               AM-PAC PT "6 Clicks" Mobility  Outcome Measure Help needed turning from your back to your side while in a flat bed without using bedrails?: A Lot Help needed moving from lying on your back to sitting on the side of a flat bed without using bedrails?: A Lot Help needed moving to and from a bed to a chair (including a wheelchair)?: A Lot Help needed standing up from a chair using your arms (e.g., wheelchair or bedside chair)?: A Lot Help needed to walk in hospital room?: Total Help needed climbing 3-5 steps with a railing? : Total 6 Click Score: 10    End of Session Equipment Utilized During Treatment: Gait belt Activity Tolerance: Patient  limited by pain;Patient limited by fatigue Patient left: in chair;with call bell/phone within reach;with family/visitor present Nurse Communication: Mobility status PT Visit Diagnosis: Unsteadiness on feet (R26.81);Muscle weakness (generalized) (M62.81);Difficulty in walking, not elsewhere classified (R26.2);Pain Pain - Right/Left: Right Pain - part of body: Hand    Time: 1308-6578 PT Time Calculation (min) (ACUTE ONLY): 28 min   Charges:   PT Evaluation $PT Eval Moderate Complexity: 1 Mod PT Treatments $Therapeutic Activity: 8-22 mins        Lyanne Co, PT  Acute Rehab Services  Pager [phone] Office [phone]   Lawana Chambers Latrece Nitta 11/15/2018, 10:03 AM

## 2018-11-15 NOTE — Plan of Care (Signed)
Problem: Education: Goal: Knowledge of General Education information will improve Description Including pain rating scale, medication(s)/side effects and non-pharmacologic comfort measures Outcome: Progressing   Problem: Health Behavior/Discharge Planning: Goal: Ability to manage health-related needs will improve Outcome: Progressing   Problem: Activity: Goal: Risk for activity intolerance will decrease Outcome: Progressing   Problem: Nutrition: Goal: Adequate nutrition will be maintained Outcome: Progressing   Problem: Coping: Goal: Level of anxiety will decrease Outcome: Progressing   Problem: Elimination: Goal: Will not experience complications related to urinary retention Outcome: Progressing   Problem: Pain Managment: Goal: General experience of comfort will improve Outcome: Progressing   Problem: Safety: Goal: Ability to remain free from injury will improve Outcome: Progressing   Problem: Skin Integrity: Goal: Risk for impaired skin integrity will decrease Outcome: Progressing   

## 2018-11-15 NOTE — ED Notes (Signed)
Patient transported to MRI 

## 2018-11-15 NOTE — H&P (Signed)
History and Physical    HEWAN STUHL OVF:[id number] DOB: Dec 24, 1929 DOA: 11/14/2018  PCP: Helane Rima, DO  Patient coming from: Home.  Chief Complaint: Low back pain.  HPI: [name] [name] is a 85 y.o. female with history of rheumatoid arthritis, gout, diabetes mellitus type 2, diastolic CHF, PE on Xarelto, history of CVA, iron deficiency, hypertension, nonobstructive CAD was recently admitted last month for low back pain at the time MRI of the lumbar spine showed degenerative disc disease and patient was eventually discharged to rehab.  Patient was at the rehab for few days and was discharged back home.  Last few days patient has been having increasing pain which is acutely worsened over the last 48 hours.  Patient states he is not able to bear any weight on the exam she feels weak on the right leg lower extremity particularly.  Denies any incontinence of urine or bowel.  Last 24 hours patient has been having some fever.  Denies any productive cough chest pain.  Over the last 2 days patient also has been a increasing pain and swelling of the right wrist which may be typical for her gout.  ED Course: In the ER patient was mildly febrile.  On exam patient has strength on both lower extremity with no hyperreflexia.  But has general weakness.  Complains of significant pain on minimal movement of her back.  Patient admitted for further management of low back pain with mild fever and weakness of the right lower extremity.  Review of Systems: As per HPI, rest all negative.   Past Medical History:  Diagnosis Date  . Allergic rhinitis due to pollen 04/27/2007  . Arthritis    "in q joint" (08/07/2013)  . CHF (congestive heart failure) (HCC)   . Coronary atherosclerosis of native coronary artery   . Cough   . Disorder of bone and cartilage, unspecified   . Edema 05/03/2013  . Exertional shortness of breath    "sometimes" (08/07/2013)  . First degree atrioventricular block   . Gout,  unspecified 04/19/2013  . Hyperlipidemia 04/27/2007  . Hypertension   . Midline low back pain without sciatica 06/27/2014  . Muscle spasm   . Muscle weakness (generalized)   . Myocardial infarction (HCC) ~ 1970  . Onychia and paronychia of toe   . Osteoarthrosis, unspecified whether generalized or localized, unspecified site 04/27/2007  . Other fall   . Other malaise and fatigue   . Pneumonia 05/2018  . Prepatellar bursitis   . Pulmonary embolism (HCC) 07/2018  . Rheumatic nodule 06/28/2017  . Seizures (HCC)   . Stroke (HCC) 01/17/2014  . TIA (transient ischemic attack)    "a few one summer" (08/07/2013)  . Type II diabetes mellitus (HCC)    "fasting 90-110s" (08/07/2013)  . Unspecified essential hypertension 08/08/2013  . Unspecified vitamin D deficiency     Past Surgical History:  Procedure Laterality Date  . ABDOMINAL HYSTERECTOMY  04/1980  . APPENDECTOMY  1960  . CARDIAC CATHETERIZATION  2003  . CATARACT EXTRACTION W/ INTRAOCULAR LENS  IMPLANT, BILATERAL Bilateral ~ 2012  . CHOLECYSTECTOMY N/A 06/26/2018   Procedure: LAPAROSCOPIC CHOLECYSTECTOMY POSSIBLE INTRAOPERATIVE CHOLANGIOGRAM;  Surgeon: Abigail Miyamoto, MD;  Location: [facility] OR;  Service: General;  Laterality: N/A;  . JOINT REPLACEMENT    . REPLACEMENT TOTAL KNEE Right 09/2005  . RIGHT/LEFT HEART CATH AND CORONARY ANGIOGRAPHY N/A 06/01/2018   Procedure: RIGHT/LEFT HEART CATH AND CORONARY ANGIOGRAPHY;  Surgeon: Corky Crafts, MD;  Location: [facility] INVASIVE CV  LAB;  Service: Cardiovascular;  Laterality: N/A;  . SHOULDER OPEN ROTATOR CUFF REPAIR Right 07/1999  . TONSILLECTOMY  08/1974  . TOTAL KNEE ARTHROPLASTY Left 08/07/2013   Procedure: TOTAL KNEE ARTHROPLASTY- LEFT;  Surgeon: Dannielle Huh, MD;  Location: [facility] OR;  Service: Orthopedics;  Laterality: Left;  . TRANSTHORACIC ECHOCARDIOGRAM  2003   EF 55-65%; mild concentric LVH     reports that she has never smoked. She has never used smokeless tobacco. She reports that she  does not drink alcohol or use drugs.  Allergies  Allergen Reactions  . Clonidine Derivatives Swelling    [name]  . Fish Allergy Diarrhea, Swelling and Other (See Comments)    Turns skin "black," but can tolerate white fish Salmon- Diarrhea  . Shellfish Allergy Hives  . Doxycycline Rash  . Indomethacin Other (See Comments)    Reaction not recalled by the patient  . Lyrica [Pregabalin] Other (See Comments)    Hallucinations   . Methyldopa Other (See Comments)    Aldomet (for hypertension): Reaction not recalled by the patient  . Morphine And Related Other (See Comments)    Family reports it drops her bp that she needs iv fluids  . Orange Fruit [Citrus] Other (See Comments)    Indigestion/heartburn  . Strawberry (Diagnostic) Itching  . Cetirizine Hcl Itching and Rash  . Codeine Itching  . Levaquin [Levofloxacin In D5w] Rash  . Tomato Rash    Family History  Problem Relation Age of Onset  . Heart attack Father     Prior to Admission medications   Medication Sig Start Date End Date Taking? Authorizing Provider  albuterol (PROVENTIL HFA;VENTOLIN HFA) 108 (90 Base) MCG/ACT inhaler Inhale 1-2 puffs into the lungs every 6 (six) hours as needed for wheezing or shortness of breath. 10/28/18  Yes Margit Hanks, MD  allopurinol (ZYLOPRIM) 100 MG tablet Take 1 tablet (100 mg total) by mouth at bedtime. 10/28/18  Yes Margit Hanks, MD  carvedilol (COREG) 25 MG tablet Take 25 mg by mouth 2 (two) times daily with a meal.   Yes [provider]  D3-1000 25 MCG (1000 UT) capsule Take 1 capsule (1,000 Units total) by mouth daily. 10/28/18  Yes Margit Hanks, MD  diclofenac sodium (VOLTAREN) 1 % GEL Apply 2 g topically 4 (four) times daily as needed. Patient taking differently: Apply 2 g topically daily as needed (pain).  10/28/18  Yes Margit Hanks, MD  ENSURE (ENSURE) Take 237 mLs by mouth every  evening.    Yes [provider]  furosemide (LASIX) 40 MG tablet Take 0.5 tablets (20 mg total) by mouth daily. 10/28/18  Yes Margit Hanks, MD  gabapentin (NEURONTIN) 100 MG capsule Take 1 capsule (100 mg total) by mouth at bedtime. 10/28/18  Yes Margit Hanks, MD  hydrALAZINE (APRESOLINE) 100 MG tablet Take 1 tablet (100 mg total) by mouth 3 (three) times daily. 10/28/18  Yes Margit Hanks, MD  hydroxychloroquine (PLAQUENIL) 200 MG tablet Take 1 tablet (200 mg total) by mouth 2 (two) times daily. 10/28/18  Yes Margit Hanks, MD  hydroxypropyl methylcellulose / hypromellose (ISOPTO TEARS / GONIOVISC) 2.5 % ophthalmic solution Place 1 drop into both eyes daily. 10/28/18  Yes Margit Hanks, MD  isosorbide mononitrate (IMDUR) 30 MG 24 hr tablet Take 1 tablet (30 mg total) by mouth at bedtime. 10/28/18  Yes Margit Hanks, MD  leflunomide (ARAVA) 20 MG tablet  Take 1 tablet (20 mg total) by mouth daily. 10/28/18  Yes Margit Hanks, MD  Olopatadine HCl 0.2 % SOLN Place 1 drop into both eyes daily. 10/28/18  Yes Margit Hanks, MD  predniSONE (DELTASONE) 10 MG tablet Take 1 tablet (10 mg total) by mouth daily. 10/28/18  Yes Margit Hanks, MD  rivaroxaban (XARELTO) 20 MG TABS tablet Take 1 tablet (20 mg total) by mouth daily with supper. 10/28/18  Yes Margit Hanks, MD  sacubitril-valsartan (ENTRESTO) 49-51 MG Take 1 tablet by mouth daily at 12 noon. 10/28/18  Yes Margit Hanks, MD  traMADol (ULTRAM) 50 MG tablet Take 2 tablets (100 mg total) by mouth every 6 (six) hours for 7 days. Patient taking differently: Take 50 mg by mouth every 6 (six) hours as needed for moderate pain.  10/28/18 11/14/18 Yes Margit Hanks, MD  triamcinolone cream (KENALOG) 0.1 % Apply 1 application topically 2 (two) times daily as needed (for itching- to affected sites). 10/28/18  Yes Margit Hanks, MD  vitamin C (ASCORBIC ACID) 500 MG tablet Take 1 tablet (500 mg total) by  mouth daily. 10/28/18  Yes Margit Hanks, MD  carvedilol (COREG) 6.25 MG tablet Take 1 tablet (6.25 mg total) by mouth 2 (two) times daily with a meal. Patient not taking: Reported on 11/14/2018 10/28/18   Margit Hanks, MD  potassium chloride SA (K-DUR,KLOR-CON) 20 MEQ tablet Take 1 tablet (20 mEq total) by mouth 2 (two) times daily. Patient not taking: Reported on 11/14/2018 10/28/18   Margit Hanks, MD    Physical Exam: Vitals:   11/14/18 1936 11/14/18 2000 11/14/18 2021 11/14/18 2305  BP: (!) 164/93 (!) 178/94 (!) 178/94 (!) 151/102  Pulse: (!) 108 (!) 106 (!) 108 (!) 116  Resp: 14 20 (!) 22 20  Temp:   (!) 100.9 F (38.3 C) 98.7 F (37.1 C)  TempSrc:   Rectal Oral  SpO2: 98% 99% 97% 96%  Weight:      Height:          Constitutional: Moderately built and nourished. Vitals:   11/14/18 1936 11/14/18 2000 11/14/18 2021 11/14/18 2305  BP: (!) 164/93 (!) 178/94 (!) 178/94 (!) 151/102  Pulse: (!) 108 (!) 106 (!) 108 (!) 116  Resp: 14 20 (!) 22 20  Temp:   (!) 100.9 F (38.3 C) 98.7 F (37.1 C)  TempSrc:   Rectal Oral  SpO2: 98% 99% 97% 96%  Weight:      Height:       Eyes: Anicteric no pallor. ENMT: No discharge from the ears eyes nose and mouth. Neck: No mass felt.  No neck rigidity but no JVD appreciated. Respiratory: No rhonchi or crepitations. Cardiovascular: S1-S2 heard. Abdomen: Soft nontender bowel sounds present. Musculoskeletal: No edema. Skin: No rash. Neurologic: Alert awake oriented to time place and person.  Able to move all extremities but feels weak in the both lower extremity no hyperreflexia. Psychiatric: Appears normal.   Labs on Admission: I have personally reviewed following labs and imaging studies  CBC: Recent Labs  Lab 11/14/18 1635  WBC 6.4  NEUTROABS 4.1  HGB 10.1*  HCT 31.6*  MCV 98.8  PLT 272   Basic Metabolic Panel: Recent Labs  Lab 11/14/18 1635  NA 137  K 4.1  CL 106  CO2 24  GLUCOSE 94  BUN 15  CREATININE  0.90  CALCIUM 8.6*   GFR: Estimated Creatinine Clearance: 45.6 mL/min (by C-G formula based on  SCr of 0.9 mg/dL). Liver Function Tests: No results for input(s): AST, ALT, ALKPHOS, BILITOT, PROT, ALBUMIN in the last 168 hours. No results for input(s): LIPASE, AMYLASE in the last 168 hours. No results for input(s): AMMONIA in the last 168 hours. Coagulation Profile: Recent Labs  Lab 11/14/18 1635  INR 1.43   Cardiac Enzymes: No results for input(s): CKTOTAL, CKMB, CKMBINDEX, TROPONINI in the last 168 hours. BNP (last 3 results) No results for input(s): PROBNP in the last 8760 hours. HbA1C: No results for input(s): HGBA1C in the last 72 hours. CBG: No results for input(s): GLUCAP in the last 168 hours. Lipid Profile: No results for input(s): CHOL, HDL, LDLCALC, TRIG, CHOLHDL, LDLDIRECT in the last 72 hours. Thyroid Function Tests: Recent Labs    11/14/18 1640  TSH 1.516   Anemia Panel: No results for input(s): VITAMINB12, FOLATE, FERRITIN, TIBC, IRON, RETICCTPCT in the last 72 hours. Urine analysis:    Component Value Date/Time   COLORURINE YELLOW 11/14/2018 1635   APPEARANCEUR CLEAR 11/14/2018 1635   LABSPEC 1.012 11/14/2018 1635   PHURINE 6.0 11/14/2018 1635   GLUCOSEU NEGATIVE 11/14/2018 1635   HGBUR NEGATIVE 11/14/2018 1635   BILIRUBINUR NEGATIVE 11/14/2018 1635   BILIRUBINUR Negative 01/13/2017 1234   KETONESUR NEGATIVE 11/14/2018 1635   PROTEINUR NEGATIVE 11/14/2018 1635   UROBILINOGEN 0.2 01/13/2017 1234   UROBILINOGEN 1.0 09/11/2015 0135   NITRITE NEGATIVE 11/14/2018 1635   LEUKOCYTESUR NEGATIVE 11/14/2018 1635   Sepsis Labs: @LABRCNTIP (procalcitonin:4,lacticidven:4) )No results found for this or any previous visit (from the past 240 hour(s)).   Radiological Exams on Admission: No results found.    Assessment/Plan Principal Problem:   Low back pain Active Problems:   Type 2 diabetes mellitus with hyperlipidemia (HCC), patient declines statin   CAD  (coronary artery disease)   Rheumatoid arthritis involving multiple sites (HCC)   Anemia of chronic disease   CKD stage 3 due to type 2 diabetes mellitus (HCC)   Chronic combined systolic and diastolic CHF (congestive heart failure) (HCC)   Deep vein thrombosis (DVT) of distal vein of right lower extremity (HCC)   Acute pain   Acute gout    1. Increasing low back pain with weakness mostly on the right lower extremity -we will repeat MRI of the L-spine since patient has increasing pain and also at this time weakness.  Patient also mildly febrile.  For now patient is on IV pain medications.  Based on MRI findings will have further plans. 2. Fever -I have ordered influenza PCR blood cultures follow UA and chest x-ray and also MRI findings.  For now I have not added any antibiotics.  Patient also has possible gouty flareup. 3. Right wrist gouty flareup for which I have ordered 1 dose of IV Solu-Medrol and patient takes prednisone daily.  Check uric acid levels. 4. Still rheumatoid arthritis on leflunomide, prednisone, Plaquenil. 5. Diabetes mellitus type 2 presently not on medication we will keep patient on sliding scale coverage.  Hemoglobin A1c in August 2019 was 5.9. 6. Diastolic CHF last EF measured in October 2019 was 60 to 65%.  On Lasix.  Appears compensated. 7. Hypertension on Entresto, Imdur, hydralazine, Coreg. 8. History of PE on Xarelto. 9. History of stroke on Xarelto presently. 10. Chronic kidney disease stage III creatinine appears to be at baseline.  Follow metabolic panel. 11. Chronic anemia follow CBC.  Hemoglobin appears to be at baseline. 12. Nonobstructive CAD per cardiac cath in July 2019.  Denies any chest pain.  DVT prophylaxis: Xarelto. Code Status: Full code. Family Communication: [name]. Disposition Plan: To be determined. Consults called: None. Admission status: Observation   Eduard Clos MD [facility] Pager [phone].  If  7PM-7AM, please contact night-coverage [email or web address] Password [facility]  11/15/2018, 3:44 AM

## 2018-11-15 NOTE — Significant Event (Signed)
Rapid Response Event Note  Overview:  Called for second set of eyes for patient with decreased LOC Time Called: 1730 Arrival Time: 1738 Event Type: Neurologic  Initial Focused Assessment:  Supine in bed- warm and dry - responds to painful stimuli - slurred speech with pain - "that hurts" - purposeful with right arm - left arm slight movement to pain only - legs move to pain - abd soft - pupils 30mm ERL - daughter present states she became sleepy after lunch today - did pocket some food earlier - oral cavity clear now - RN states she was sleepy all afternoon. VSS - RA.  Dr. Benjamine Mola notified. CBG 121.  Resps reg and unlabored - O2 sats 100% on RA.   No new meds.    Interventions:  ABG requested - Dr. Benjamine Mola to bedside - update given - This RN left for an emergency - will follow up.  CBC and Ammonia levels ordered.   Follow UP:  With ABG stick patient awakened to baseline per report.  Staff to call as needed. Will follow up on labs.   Plan of Care (if not transferred):  Event Summary: Name of Physician Notified: Dr. Benjamine Mola at      at (pta RRT)  Outcome: Stayed in room and stabalized  Event End Time: 1815  [place]

## 2018-11-15 NOTE — ED Notes (Signed)
Pt's family member in the room at this time; since her arrival patient has needed assistance with every move; prior to family arrival patient was able to lip hips and place self on bed pain with no issues, pt also denied pain to RN prior to family arrival; pt now needs assistance to drink water and states she can not move right hand due to pain; family member continues to ask for things for the patient; Patient needed 2x assist to move to left side-Monique,RN

## 2018-11-15 NOTE — Progress Notes (Signed)
Patient admitted after midnight, please see H&P.  Here with worsening pain and inability to bear weight.  Has known lumbar spine issues-- MRI done this AM stable.  Patient describes more like a "flare" of her rheumatoid arthritis.  Given IV steroids x 1 with improvement.  WIll double home steroids dose with close monitoring of labs/symptoms.  PT consult.  Will need outpatient rheumatology follow up as well as orthopedic/spine follow up-- has seen Dr. Juliene Pina in the past.  Marlin Canary DO

## 2018-11-16 ENCOUNTER — Ambulatory Visit: Payer: Medicare Other | Admitting: Family Medicine

## 2018-11-16 DIAGNOSIS — D638 Anemia in other chronic diseases classified elsewhere: Secondary | ICD-10-CM | POA: Diagnosis not present

## 2018-11-16 DIAGNOSIS — I2510 Atherosclerotic heart disease of native coronary artery without angina pectoris: Secondary | ICD-10-CM | POA: Diagnosis not present

## 2018-11-16 DIAGNOSIS — I5042 Chronic combined systolic (congestive) and diastolic (congestive) heart failure: Secondary | ICD-10-CM | POA: Diagnosis not present

## 2018-11-16 DIAGNOSIS — M545 Low back pain: Secondary | ICD-10-CM | POA: Diagnosis not present

## 2018-11-16 DIAGNOSIS — R52 Pain, unspecified: Secondary | ICD-10-CM | POA: Diagnosis not present

## 2018-11-16 LAB — URINE CULTURE: Culture: NO GROWTH

## 2018-11-16 LAB — CBC
HCT: 29.7 % — ABNORMAL LOW (ref 36.0–46.0)
Hemoglobin: 9.6 g/dL — ABNORMAL LOW (ref 12.0–15.0)
MCH: 30.5 pg (ref 26.0–34.0)
MCHC: 32.3 g/dL (ref 30.0–36.0)
MCV: 94.3 fL (ref 80.0–100.0)
Platelets: 233 10*3/uL (ref 150–400)
RBC: 3.15 MIL/uL — ABNORMAL LOW (ref 3.87–5.11)
RDW: 15.2 % (ref 11.5–15.5)
WBC: 11.9 10*3/uL — ABNORMAL HIGH (ref 4.0–10.5)
nRBC: 0 % (ref 0.0–0.2)

## 2018-11-16 LAB — BASIC METABOLIC PANEL
Anion gap: 7 (ref 5–15)
BUN: 22 mg/dL (ref 8–23)
CO2: 23 mmol/L (ref 22–32)
Calcium: 8.6 mg/dL — ABNORMAL LOW (ref 8.9–10.3)
Chloride: 106 mmol/L (ref 98–111)
Creatinine, Ser: 1.17 mg/dL — ABNORMAL HIGH (ref 0.44–1.00)
GFR calc Af Amer: 49 mL/min — ABNORMAL LOW (ref >60)
GFR calc non Af Amer: 42 mL/min — ABNORMAL LOW (ref >60)
Glucose, Bld: 150 mg/dL — ABNORMAL HIGH (ref 70–99)
Potassium: 4.1 mmol/L (ref 3.5–5.1)
Sodium: 136 mmol/L (ref 135–145)

## 2018-11-16 LAB — GLUCOSE, CAPILLARY
Glucose-Capillary: 141 mg/dL — ABNORMAL HIGH (ref 70–99)
Glucose-Capillary: 127 mg/dL — ABNORMAL HIGH (ref 70–99)
Glucose-Capillary: 172 mg/dL — ABNORMAL HIGH (ref 70–99)
Glucose-Capillary: 116 mg/dL — ABNORMAL HIGH (ref 70–99)

## 2018-11-16 NOTE — Plan of Care (Signed)
Problem: Education: Goal: Knowledge of General Education information will improve Description Including pain rating scale, medication(s)/side effects and non-pharmacologic comfort measures Outcome: Progressing   Problem: Health Behavior/Discharge Planning: Goal: Ability to manage health-related needs will improve Outcome: Progressing   Problem: Clinical Measurements: Goal: Will remain free from infection Outcome: Progressing Goal: Cardiovascular complication will be avoided Outcome: Progressing   Problem: Nutrition: Goal: Adequate nutrition will be maintained Outcome: Progressing   Problem: Coping: Goal: Level of anxiety will decrease Outcome: Progressing   Problem: Elimination: Goal: Will not experience complications related to bowel motility Outcome: Progressing   Problem: Pain Managment: Goal: General experience of comfort will improve Outcome: Progressing   Problem: Safety: Goal: Ability to remain free from injury will improve Outcome: Progressing   Problem: Skin Integrity: Goal: Risk for impaired skin integrity will decrease Outcome: Progressing

## 2018-11-16 NOTE — NC FL2 (Signed)
[place] MEDICAID FL2 LEVEL OF CARE SCREENING TOOL     IDENTIFICATION  Patient Name: [name] [name] Birthdate: 29-Mar-1930 Sex: female Admission Date (Current Location): 11/14/2018  [facility] and [place] Number:  Producer, television/film/video and Address:  [facility], [address], [place], [place] [address]      Provider Number: [id number]  Attending Physician Name and Address:  [name], MD  Relative Name and Phone Number:  Elease Hashimoto (daughter) [phone]    Current Level of Care: Hospital Recommended Level of Care: Skilled Nursing Facility Prior Approval Number:    Date Approved/Denied: 08/10/13 PASRR Number: [id number]  Discharge Plan: SNF    Current Diagnoses: Patient Active Problem List   Diagnosis Date Noted  . Acute gout 11/15/2018  . Low back pain 11/14/2018  . Chronic back pain 10/27/2018  . Arthralgia of both knees 10/27/2018  . Rheumatoid arthritis involving both knees (HCC) 10/27/2018  . Acute pain 10/21/2018  . Physical deconditioning 09/06/2018  . Hyperlipidemia associated with type 2 diabetes mellitus (HCC), refuses statin 09/06/2018  . Refusal of statin medication by patient 09/04/2018  . Deep vein thrombosis (DVT) of distal vein of right lower extremity (HCC) 07/30/2018  . Hypertensive heart disease with heart failure (HCC) 07/30/2018  . Pulmonary embolism (HCC), 07/2018, on Xarelto with goal to DC after 6 months if more active 07/10/2018  . DCM (dilated cardiomyopathy) (HCC)   . Benign hypertensive heart and kidney disease with CHF and stage 3 chronic kidney disease (HCC) 05/28/2018  . Osteoarthritis 05/06/2018  . Hypokalemia 02/02/2018  . Adhesive capsulitis of right shoulder 10/12/2017  . Idiopathic chronic gout of multiple sites with tophus 08/25/2017  . History of total knee arthroplasty, bilateral 02/18/2017  . History of rotator cuff surgery 02/18/2017  . High risk medications, long-term use 09/21/2016  . Chronic  combined systolic and diastolic CHF (congestive heart failure) (HCC) 09/12/2015  . Bilateral edema of lower extremity 06/05/2014  . CKD stage 3 due to type 2 diabetes mellitus (HCC) 06/05/2014  . DM (diabetes mellitus), type 2 with renal complications (HCC) 06/05/2014    Class: Chronic  . Anemia of chronic disease 05/01/2014  . Rheumatoid arthritis involving multiple sites (HCC) 12/19/2013    Class: Chronic  . CAD (coronary artery disease) 10/18/2013  . Hypertension associated with diabetes (HCC) 08/08/2013    Class: Chronic  . Fibromyalgia 05/10/2013  . Type 2 diabetes mellitus with hyperlipidemia (HCC), patient declines statin 04/27/2007  . Allergic rhinitis 04/27/2007  . Diverticulosis 04/27/2007    Orientation RESPIRATION BLADDER Height & Weight     Self, Situation, Place, Time  Normal Continent, External catheter Weight: 79.4 kg Height:  5\' 4"  (162.6 cm)  BEHAVIORAL SYMPTOMS/MOOD NEUROLOGICAL BOWEL NUTRITION STATUS      Continent Diet(see discharge summary)  AMBULATORY STATUS COMMUNICATION OF NEEDS Skin   Extensive Assist Verbally Normal                       Personal Care Assistance Level of Assistance  Bathing, Feeding, Dressing, Total care Bathing Assistance: Maximum assistance Feeding assistance: Independent Dressing Assistance: Maximum assistance Total Care Assistance: Maximum assistance   Functional Limitations Info  Sight, Hearing, Speech Sight Info: Adequate Hearing Info: Adequate Speech Info: Adequate    SPECIAL CARE FACTORS FREQUENCY  PT (By licensed PT), OT (By licensed OT)     PT Frequency: min 5x weekly OT Frequency: min 5x weekly  Contractures Contractures Info: Not present    Additional Factors Info  Code Status, Allergies Code Status Info: Full Allergies Info: Clonidine Derivatives, Fish Allergy, Shellfish Allergy, Doxycycline, Indomethacin, Lyrica (pregabalin), Methylodpa, Morphine and Related, Orange Fruit (citrus),  Strawberry (diagnostic), Cetirizine Hcl, Codeine, Levaquin (levoflloxacin in D5w), Tomato           Current Medications (11/16/2018):  This is the current hospital active medication list Current Facility-Administered Medications  Medication Dose Route Frequency Provider Last Rate Last Dose  . acetaminophen (TYLENOL) tablet 1,000 mg  1,000 mg Oral TID Marlin Canary U, DO   1,000 mg at 11/16/18 0834  . albuterol (PROVENTIL,VENTOLIN) solution continuous neb  2.5 mg Inhalation Q6H PRN Eduard Clos, MD      . allopurinol (ZYLOPRIM) tablet 100 mg  100 mg Oral QHS Eduard Clos, MD   100 mg at 11/15/18 2244  . azithromycin (ZITHROMAX) 500 mg in sodium chloride 0.9 % 250 mL IVPB  500 mg Intravenous Q24H Marlin Canary U, DO 250 mL/hr at 11/15/18 1802 500 mg at 11/15/18 1802  . carvedilol (COREG) tablet 25 mg  25 mg Oral BID WC Eduard Clos, MD   25 mg at 11/16/18 0834  . cefTRIAXone (ROCEPHIN) 1 g in sodium chloride 0.9 % 100 mL IVPB  1 g Intravenous Q24H Vann, Jessica U, DO 200 mL/hr at 11/15/18 1724 1 g at 11/15/18 1724  . cholecalciferol (VITAMIN D3) tablet 1,000 Units  1,000 Units Oral Daily Eduard Clos, MD   1,000 Units at 11/16/18 [phone]  . diclofenac sodium (VOLTAREN) 1 % transdermal gel 2 g  2 g Topical Daily PRN Eduard Clos, MD      . feeding supplement (ENSURE ENLIVE) (ENSURE ENLIVE) liquid 237 mL  237 mL Oral QPM Eduard Clos, MD      . fentaNYL (SUBLIMAZE) injection 25 mcg  25 mcg Intravenous Q2H PRN Eduard Clos, MD   25 mcg at 11/15/18 0057  . gabapentin (NEURONTIN) capsule 100 mg  100 mg Oral QHS Eduard Clos, MD   100 mg at 11/15/18 2244  . hydrALAZINE (APRESOLINE) tablet 100 mg  100 mg Oral Q8H Eduard Clos, MD   100 mg at 11/16/18 0511  . hydroxychloroquine (PLAQUENIL) tablet 200 mg  200 mg Oral BID Eduard Clos, MD   200 mg at 11/16/18 0834  . insulin aspart (novoLOG) injection 0-9 Units  0-9 Units  Subcutaneous TID WC Eduard Clos, MD   1 Units at 11/16/18 [phone]  . isosorbide mononitrate (IMDUR) 24 hr tablet 30 mg  30 mg Oral QHS Eduard Clos, MD   30 mg at 11/15/18 2244  . leflunomide (ARAVA) tablet 20 mg  20 mg Oral Daily Eduard Clos, MD   20 mg at 11/16/18 0834  . ondansetron (ZOFRAN) tablet 4 mg  4 mg Oral Q6H PRN Eduard Clos, MD       Or  . ondansetron [facility]) injection 4 mg  4 mg Intravenous Q6H PRN Eduard Clos, MD      . polyvinyl alcohol (LIQUIFILM TEARS) 1.4 % ophthalmic solution 1 drop  1 drop Both Eyes Daily Eduard Clos, MD   1 drop at 11/16/18 0834  . predniSONE (DELTASONE) tablet 20 mg  20 mg Oral Q breakfast Marlin Canary U, DO   20 mg at 11/16/18 0834  . rivaroxaban (XARELTO) tablet 20 mg  20 mg Oral Q supper Eduard Clos, MD      .  senna (SENOKOT) tablet 8.6 mg  1 tablet Oral Daily Eduard Clos, MD   8.6 mg at 11/16/18 0834  . vitamin C (ASCORBIC ACID) tablet 500 mg  500 mg Oral Daily Eduard Clos, MD   500 mg at 11/16/18 1610     Discharge Medications: Please see discharge summary for a list of discharge medications.  Relevant Imaging Results:  Relevant Lab Results:   Additional Information SSN: [id number]  Gildardo Griffes, LCSW

## 2018-11-16 NOTE — Consult Note (Signed)
   [facility] CM Inpatient Consult   11/16/2018  FRANNIE AVERY 1930-08-28 [id number]   Patient screened for potential [facility] Care Management services due to unplanned readmission risk score of 36%, extreme, multiple hospitalizations, and 30 day unplanned readmission.   Went to bedside to speak with patient [facility] Care Management Services. Patient being attended by nursing staff at this time.   Spoke with inpatient RNCM. Current disposition plan is for patient to transition to SNF.   1430  Spoke with patient and daughter at bedside. Attempts to engage patient by [facility] RN Care Manager had been unsuccessful in October 2019. Explained to patient and daughter about [facility] community care management services. Patient affirms current disposition plan for SNF. Informed patient that she may engage community care management services after transitions home. Patient and daughter accept [facility] brochure and were thankful for information.   Explained that [facility] Care Management services does not replace or interfere with any services that are needed or arranged by inpatient case management or social work.   Christophe Louis, MSN, RN [facility] Liaison Nurse Mobile Phone [phone]  Toll free office [phone]

## 2018-11-16 NOTE — Discharge Instructions (Signed)

## 2018-11-16 NOTE — Clinical Social Work Note (Signed)
Clinical Social Work Assessment  Patient Details  Name: [name] [name] MRN: [id number] Date of Birth: Mar 31, 1930  Date of referral:  11/16/18               Reason for consult:  Discharge Planning                Permission sought to share information with:  Case Manager, Facility Medical sales representative, Family Supports Permission granted to share information::  Yes, Verbal Permission Granted  Name::     [name] [name]  Agency::  SNFs  Relationship::  daughter  Contact Information:  [phone]  Housing/Transportation Living arrangements for the past 2 months:  Single Family Home Source of Information:  Patient Patient Interpreter Needed:  None Criminal Activity/Legal Involvement Pertinent to Current Situation/Hospitalization:  No - Comment as needed Significant Relationships:  Adult Children Lives with:  Self Do you feel safe going back to the place where you live?  No Need for family participation in patient care:  Yes (Comment)  Care giving concerns:  CSW received referral for possible SNF placement at time of discharge. Spoke with patient regarding possibility of SNF placement . [name]  family  is currently unable to care for her at their home given patient's current needs and fall risk.  Patient and [name] at bedside expressed understanding of PT recommendation and are agreeable to SNF placement at time of discharge. CSW to continue to follow and assist with discharge planning needs.     Social Worker assessment / plan:  Spoke with patient and  [name] at bedside   concerning possibility of rehab at [facility] before returning home. They report patient has been to [facility] in the past and would like to return there as she has had positive experiences.    Employment status:  Retired Database administrator PT Recommendations:  Skilled Nursing Facility Information / Referral to community resources:  Skilled Nursing Facility  Patient/Family's  Response to care:  Patient and [name] at bedside    recognize need for rehab before returning home and are agreeable to a SNF and report preference for  [facility] as patient has been there in the past . CSW explained insurance authorization process. [name] family reported that they want patient to get stronger to be able to come back home.    Patient/Family's Understanding of and Emotional Response to Diagnosis, Current Treatment, and Prognosis:  Patient/family is realistic regarding therapy needs and expressed being hopeful for SNF placement. Patient expressed understanding of CSW role and discharge process as well as medical condition. No questions/concerns about plan or treatment.    Emotional Assessment Appearance:  Appears stated age Attitude/Demeanor/Rapport:  Gracious Affect (typically observed):  Accepting, Adaptable Orientation:  Oriented to Self, Oriented to Place, Oriented to  Time, Oriented to Situation Alcohol / Substance use:  Not Applicable Psych involvement (Current and /or in the community):  No (Comment)  Discharge Needs  Concerns to be addressed:  Discharge Planning Concerns Readmission within the last 30 days:  No Current discharge risk:  Dependent with Mobility Barriers to Discharge:  Continued Medical Work up   [employer], LCSW 11/16/2018, 10:57 AM

## 2018-11-17 ENCOUNTER — Other Ambulatory Visit: Payer: Self-pay | Admitting: Internal Medicine

## 2018-11-17 DIAGNOSIS — D638 Anemia in other chronic diseases classified elsewhere: Secondary | ICD-10-CM | POA: Diagnosis not present

## 2018-11-17 DIAGNOSIS — R609 Edema, unspecified: Secondary | ICD-10-CM | POA: Diagnosis not present

## 2018-11-17 DIAGNOSIS — I69398 Other sequelae of cerebral infarction: Secondary | ICD-10-CM | POA: Diagnosis not present

## 2018-11-17 DIAGNOSIS — E1159 Type 2 diabetes mellitus with other circulatory complications: Secondary | ICD-10-CM | POA: Diagnosis not present

## 2018-11-17 DIAGNOSIS — M4856XD Collapsed vertebra, not elsewhere classified, lumbar region, subsequent encounter for fracture with routine healing: Secondary | ICD-10-CM | POA: Diagnosis not present

## 2018-11-17 DIAGNOSIS — Z8673 Personal history of transient ischemic attack (TIA), and cerebral infarction without residual deficits: Secondary | ICD-10-CM | POA: Diagnosis not present

## 2018-11-17 DIAGNOSIS — I69828 Other speech and language deficits following other cerebrovascular disease: Secondary | ICD-10-CM | POA: Diagnosis not present

## 2018-11-17 DIAGNOSIS — R2681 Unsteadiness on feet: Secondary | ICD-10-CM | POA: Diagnosis not present

## 2018-11-17 DIAGNOSIS — M545 Low back pain: Secondary | ICD-10-CM | POA: Diagnosis not present

## 2018-11-17 DIAGNOSIS — N189 Chronic kidney disease, unspecified: Secondary | ICD-10-CM | POA: Diagnosis not present

## 2018-11-17 DIAGNOSIS — Z79899 Other long term (current) drug therapy: Secondary | ICD-10-CM | POA: Diagnosis not present

## 2018-11-17 DIAGNOSIS — I252 Old myocardial infarction: Secondary | ICD-10-CM | POA: Diagnosis not present

## 2018-11-17 DIAGNOSIS — M109 Gout, unspecified: Secondary | ICD-10-CM | POA: Diagnosis not present

## 2018-11-17 DIAGNOSIS — I429 Cardiomyopathy, unspecified: Secondary | ICD-10-CM | POA: Diagnosis not present

## 2018-11-17 DIAGNOSIS — M069 Rheumatoid arthritis, unspecified: Secondary | ICD-10-CM | POA: Diagnosis not present

## 2018-11-17 DIAGNOSIS — I2510 Atherosclerotic heart disease of native coronary artery without angina pectoris: Secondary | ICD-10-CM

## 2018-11-17 DIAGNOSIS — G9340 Encephalopathy, unspecified: Secondary | ICD-10-CM | POA: Diagnosis not present

## 2018-11-17 DIAGNOSIS — R52 Pain, unspecified: Secondary | ICD-10-CM

## 2018-11-17 DIAGNOSIS — M1A09X1 Idiopathic chronic gout, multiple sites, with tophus (tophi): Secondary | ICD-10-CM | POA: Diagnosis not present

## 2018-11-17 DIAGNOSIS — I130 Hypertensive heart and chronic kidney disease with heart failure and stage 1 through stage 4 chronic kidney disease, or unspecified chronic kidney disease: Secondary | ICD-10-CM | POA: Diagnosis not present

## 2018-11-17 DIAGNOSIS — I5042 Chronic combined systolic (congestive) and diastolic (congestive) heart failure: Secondary | ICD-10-CM | POA: Diagnosis not present

## 2018-11-17 DIAGNOSIS — I824Z1 Acute embolism and thrombosis of unspecified deep veins of right distal lower extremity: Secondary | ICD-10-CM

## 2018-11-17 DIAGNOSIS — Z79891 Long term (current) use of opiate analgesic: Secondary | ICD-10-CM | POA: Diagnosis not present

## 2018-11-17 DIAGNOSIS — M4807 Spinal stenosis, lumbosacral region: Secondary | ICD-10-CM | POA: Diagnosis not present

## 2018-11-17 DIAGNOSIS — I160 Hypertensive urgency: Secondary | ICD-10-CM | POA: Diagnosis not present

## 2018-11-17 DIAGNOSIS — K819 Cholecystitis, unspecified: Secondary | ICD-10-CM | POA: Diagnosis not present

## 2018-11-17 DIAGNOSIS — M6281 Muscle weakness (generalized): Secondary | ICD-10-CM | POA: Diagnosis not present

## 2018-11-17 DIAGNOSIS — I152 Hypertension secondary to endocrine disorders: Secondary | ICD-10-CM | POA: Diagnosis not present

## 2018-11-17 DIAGNOSIS — I2699 Other pulmonary embolism without acute cor pulmonale: Secondary | ICD-10-CM | POA: Diagnosis not present

## 2018-11-17 DIAGNOSIS — D631 Anemia in chronic kidney disease: Secondary | ICD-10-CM | POA: Diagnosis not present

## 2018-11-17 DIAGNOSIS — M4316 Spondylolisthesis, lumbar region: Secondary | ICD-10-CM | POA: Diagnosis not present

## 2018-11-17 DIAGNOSIS — Z86711 Personal history of pulmonary embolism: Secondary | ICD-10-CM | POA: Diagnosis not present

## 2018-11-17 DIAGNOSIS — G5793 Unspecified mononeuropathy of bilateral lower limbs: Secondary | ICD-10-CM | POA: Diagnosis not present

## 2018-11-17 DIAGNOSIS — I110 Hypertensive heart disease with heart failure: Secondary | ICD-10-CM | POA: Diagnosis not present

## 2018-11-17 DIAGNOSIS — R635 Abnormal weight gain: Secondary | ICD-10-CM | POA: Diagnosis not present

## 2018-11-17 DIAGNOSIS — M0579 Rheumatoid arthritis with rheumatoid factor of multiple sites without organ or systems involvement: Secondary | ICD-10-CM

## 2018-11-17 DIAGNOSIS — Z7401 Bed confinement status: Secondary | ICD-10-CM | POA: Diagnosis not present

## 2018-11-17 DIAGNOSIS — M0609 Rheumatoid arthritis without rheumatoid factor, multiple sites: Secondary | ICD-10-CM | POA: Diagnosis not present

## 2018-11-17 DIAGNOSIS — E1169 Type 2 diabetes mellitus with other specified complication: Secondary | ICD-10-CM | POA: Diagnosis not present

## 2018-11-17 DIAGNOSIS — R600 Localized edema: Secondary | ICD-10-CM | POA: Diagnosis not present

## 2018-11-17 DIAGNOSIS — M48061 Spinal stenosis, lumbar region without neurogenic claudication: Secondary | ICD-10-CM | POA: Diagnosis not present

## 2018-11-17 DIAGNOSIS — Z7901 Long term (current) use of anticoagulants: Secondary | ICD-10-CM | POA: Diagnosis not present

## 2018-11-17 DIAGNOSIS — E1122 Type 2 diabetes mellitus with diabetic chronic kidney disease: Secondary | ICD-10-CM | POA: Diagnosis not present

## 2018-11-17 DIAGNOSIS — J810 Acute pulmonary edema: Secondary | ICD-10-CM | POA: Diagnosis not present

## 2018-11-17 DIAGNOSIS — E876 Hypokalemia: Secondary | ICD-10-CM | POA: Diagnosis not present

## 2018-11-17 DIAGNOSIS — R569 Unspecified convulsions: Secondary | ICD-10-CM | POA: Diagnosis not present

## 2018-11-17 DIAGNOSIS — M2550 Pain in unspecified joint: Secondary | ICD-10-CM | POA: Diagnosis not present

## 2018-11-17 DIAGNOSIS — N183 Chronic kidney disease, stage 3 (moderate): Secondary | ICD-10-CM | POA: Diagnosis not present

## 2018-11-17 DIAGNOSIS — E785 Hyperlipidemia, unspecified: Secondary | ICD-10-CM | POA: Diagnosis not present

## 2018-11-17 DIAGNOSIS — Z86718 Personal history of other venous thrombosis and embolism: Secondary | ICD-10-CM | POA: Diagnosis not present

## 2018-11-17 DIAGNOSIS — M47816 Spondylosis without myelopathy or radiculopathy, lumbar region: Secondary | ICD-10-CM | POA: Diagnosis not present

## 2018-11-17 LAB — GLUCOSE, CAPILLARY
Glucose-Capillary: 105 mg/dL — ABNORMAL HIGH (ref 70–99)
Glucose-Capillary: 94 mg/dL (ref 70–99)
Glucose-Capillary: 120 mg/dL — ABNORMAL HIGH (ref 70–99)
Glucose-Capillary: 167 mg/dL — ABNORMAL HIGH (ref 70–99)

## 2018-11-17 MED ORDER — PREDNISONE 10 MG PO TABS: 10.0000 mg | ORAL_TABLET | Freq: Every day | ORAL | 0 refills | Status: DC

## 2018-11-17 MED ORDER — FUROSEMIDE 40 MG PO TABS: 20.0000 mg | ORAL_TABLET | Freq: Every day | ORAL | 0 refills | Status: DC | PRN

## 2018-11-17 MED ORDER — TRAMADOL HCL 50 MG PO TABS: 100.0000 mg | ORAL_TABLET | Freq: Three times a day (TID) | ORAL | 0 refills | Status: DC

## 2018-11-17 MED ORDER — AZITHROMYCIN 500 MG PO TABS
500.0000 mg | ORAL_TABLET | ORAL | Status: DC
  Filled 2018-11-17: qty 1

## 2018-11-17 NOTE — Discharge Summary (Addendum)
Physician Discharge Summary  CAPPIE GLOCKNER KYH:[id number] DOB: [birth year] DOA: 11/14/2018  PCP: Helane Rima, DO  Admit date: 11/14/2018 Discharge date: 11/17/2018  Recommendations for Outpatient Follow-up:  MD at the facility  Call and set up appointment for follow-up with orthopedics  Discharge Diagnoses:  1. Low back pain-severe spinal stenosis 2. Type II diabetes mellitus type 2 3. Rheumatoid arthritis involving multiple joints  Discharge Condition: Good Disposition: Skilled nursing facility  Diet recommendation: Carb modified  Filed Weights   11/14/18 1549 11/16/18 0500 11/17/18 0537  Weight: 78.9 kg 79.4 kg 79.2 kg    History of present illness:  [name] [name] is an 85 y.o. female RA, gout, diabetes mellitus type 2, diastolic CHF, PE on Xarelto, history of CVA, iron deficiency, hypertension, nonobstructive CAD.  Patient admitted last month for low back pain with MRI showing degenerative disc disease and eventually discharged to rehab.   Hospital Course:  Low back pain, severe spinal stenosis: Repeat MRI of the L-spine shows no acute changes from December imaging showing severe spinal stenosis of L4-L5 with lateral recess narrowing and compression fracture.   - Continue physical therapy   Fever: Patient initially reported complaints of fever.  No fevers documented since admission.  No influenza screen appears to have been obtained.  UA and chest x-ray showed no acute findings.  Fever could have been related with flare of rheumatoid arthritis.  Patient had initially been on ceftriaxone and azithromycin these medications were discontinued as no signs of infection were noted.  Altered mental status: Patient appears to have some aspect of dementia with poor memory.  Family and notes that she lives alone and are wanting to have her placed in a skilled nursing facility.  Suspected rheumatoid arthritis flare: Normally on 10 mg of prednisone daily but had been out of this at  home. - Continue leflunomide and Plaquenil - Continue increased dose of prednisone for 20 mg, will decrease back to daily 10 at discharge  Essential hypertension: Change Lasix to as needed for fluid and edema. - Continue Entresto, Imdur, hydralazine, and Coreg as tolerated  Diastolic CHF: Last EF October 2019 was 60-65%.  [name] Lasix was put on hold as altered mental status was thought to be related with dehydration.  Patient had been treated with normal saline IV fluids 50 mL/h on 1/7 overnight until mid afternoon. - Discontinue IV fluids - Continue to hold Lasix for now  History of PE/DVT - Continue Xarelto  History of gout - Continue home meds  History of CVA - Continue Xarelto  Diabetes mellitus type 2: Patient was on sliding scale insulin while hospitalized.  Appears reports noted patient previously being on metformin, but not on admission medications.  Anemia of chronic disease: Stable - Continue to monitor  CAD: Nonobstructive per cath in July 2019. Patient without chest pain complaints.   Discharge Instructions   Allergies as of 11/17/2018      Reactions   Clonidine Derivatives Swelling   [name]   Fish Allergy Diarrhea, Swelling, Other (See Comments)   Turns skin "black," but can tolerate white fish Salmon- Diarrhea   Shellfish Allergy Hives   Doxycycline Rash   Indomethacin Other (See Comments)   Reaction not recalled by the patient   Lyrica [pregabalin] Other (See Comments)   Hallucinations   Methyldopa Other (See Comments)   Aldomet (for hypertension): Reaction not recalled by the patient   Morphine And Related Other (See Comments)  Family reports it drops her bp that she needs iv fluids   Orange Fruit [citrus] Other (See Comments)   Indigestion/heartburn   Strawberry (diagnostic) Itching   Cetirizine Hcl Itching, Rash   Codeine Itching   Levaquin [levofloxacin In D5w]  Rash   Tomato Rash      Medication List    STOP taking these medications   potassium chloride SA 20 MEQ tablet Commonly known as:  K-DUR,KLOR-CON     TAKE these medications   albuterol 108 (90 Base) MCG/ACT inhaler Commonly known as:  PROVENTIL HFA;VENTOLIN HFA Inhale 1-2 puffs into the lungs every 6 (six) hours as needed for wheezing or shortness of breath.   allopurinol 100 MG tablet Commonly known as:  ZYLOPRIM Take 1 tablet (100 mg total) by mouth at bedtime.   carvedilol 25 MG tablet Commonly known as:  COREG Take 25 mg by mouth 2 (two) times daily with a meal. What changed:  Another medication with the same name was removed. Continue taking this medication, and follow the directions you see here.   D3-1000 25 MCG (1000 UT) capsule Generic drug:  Cholecalciferol Take 1 capsule (1,000 Units total) by mouth daily.   diclofenac sodium 1 % Gel Commonly known as:  VOLTAREN Apply 2 g topically 4 (four) times daily as needed. What changed:    when to take this  reasons to take this   ENSURE Take 237 mLs by mouth every evening.   furosemide 40 MG tablet Commonly known as:  LASIX Take 0.5 tablets (20 mg total) by mouth daily as needed for fluid or edema. What changed:    when to take this  reasons to take this   gabapentin 100 MG capsule Commonly known as:  NEURONTIN Take 1 capsule (100 mg total) by mouth at bedtime.   hydrALAZINE 100 MG tablet Commonly known as:  APRESOLINE Take 1 tablet (100 mg total) by mouth 3 (three) times daily.   hydroxychloroquine 200 MG tablet Commonly known as:  PLAQUENIL Take 1 tablet (200 mg total) by mouth 2 (two) times daily.   hydroxypropyl methylcellulose / hypromellose 2.5 % ophthalmic solution Commonly known as:  ISOPTO TEARS / GONIOVISC Place 1 drop into both eyes daily.   isosorbide mononitrate 30 MG 24 hr tablet Commonly known as:  IMDUR Take 1 tablet (30 mg total) by mouth at bedtime.   leflunomide 20 MG  tablet Commonly known as:  ARAVA Take 1 tablet (20 mg total) by mouth daily.   Olopatadine HCl 0.2 % Soln Place 1 drop into both eyes daily.   predniSONE 10 MG tablet Commonly known as:  DELTASONE Take 1 tablet (10 mg total) by mouth daily.   rivaroxaban 20 MG Tabs tablet Commonly known as:  XARELTO Take 1 tablet (20 mg total) by mouth daily with supper.   sacubitril-valsartan 49-51 MG Commonly known as:  ENTRESTO Take 1 tablet by mouth daily at 12 noon.   traMADol 50 MG tablet Commonly known as:  ULTRAM Take 2 tablets (100 mg total) by mouth every 6 (six) hours for 7 days. What changed:    how much to take  when to take this  reasons to take this   triamcinolone cream 0.1 % Commonly known as:  KENALOG Apply 1 application topically 2 (two) times daily as needed (for itching- to affected sites).   vitamin C 500 MG tablet Commonly known as:  ASCORBIC ACID Take 1 tablet (500 mg total) by mouth daily.  Allergies  Allergen Reactions  . Clonidine Derivatives Swelling    [name]  . Fish Allergy Diarrhea, Swelling and Other (See Comments)    Turns skin "black," but can tolerate white fish Salmon- Diarrhea  . Shellfish Allergy Hives  . Doxycycline Rash  . Indomethacin Other (See Comments)    Reaction not recalled by the patient  . Lyrica [Pregabalin] Other (See Comments)    Hallucinations   . Methyldopa Other (See Comments)    Aldomet (for hypertension): Reaction not recalled by the patient  . Morphine And Related Other (See Comments)    Family reports it drops her bp that she needs iv fluids  . Orange Fruit [Citrus] Other (See Comments)    Indigestion/heartburn  . Strawberry (Diagnostic) Itching  . Cetirizine Hcl Itching and Rash  . Codeine Itching  . Levaquin [Levofloxacin In D5w] Rash  . Tomato Rash    The results of significant diagnostics from this hospitalization (including  imaging, microbiology, ancillary and laboratory) are listed below for reference.    Significant Diagnostic Studies: Dg Lumbar Spine Complete  Result Date: 10/21/2018 CLINICAL DATA:  Bilateral hip and leg pain EXAM: LUMBAR SPINE - COMPLETE 4+ VIEW COMPARISON:  Abdominal CT 09/11/2015 FINDINGS: Transitional S1 vertebra with rudimentary S1-2 disc space. Remote L1 superior endplate fracture based on prior. Height loss is mild. No acute fracture is seen. Advanced diffuse degenerative disc narrowing. Diffuse advanced facet arthropathy with spurring and anterolisthesis at L4-5 and L5-S1. There is milder retrolisthesis at L2-3. IMPRESSION: 1. No acute finding. 2. Remote L1 superior endplate fracture. 3. Advanced degenerative disease with multilevel listhesis. Electronically Signed   By: Marnee Spring [name]   On: 10/21/2018 10:30   Mr Lumbar Spine Wo Contrast  Result Date: 11/15/2018 CLINICAL DATA:  Initial evaluation for acute rapidly progressive back pain. EXAM: MRI LUMBAR SPINE WITHOUT CONTRAST TECHNIQUE: Multiplanar, multisequence MR imaging of the lumbar spine was performed. No intravenous contrast was administered. COMPARISON:  Recent MRI from 10/21/2018 FINDINGS: Segmentation: Transitional lumbosacral anatomy with nearly fully formed S1-2 interspace. Same numbering system employed as on previous exam. Alignment: Mild levoconvex scoliosis. Chronic grade 1 anterolisthesis of L4 on L5 and L5 on S1, stable. Trace retrolisthesis of L2 on L3 also unchanged. Vertebrae: Chronic height loss with associated Schmorl's node at the superior endplate of L1, stable. Vertebral body heights otherwise maintained without evidence for acute or interval fracture. Bone marrow signal intensity within normal limits. No discrete or worrisome osseous lesions. No abnormal marrow edema. Reactive endplate changes at the superior endplate of L1, stable. Conus medullaris and cauda equina: Conus extends to the L2 level. Conus and cauda  equina appear normal. Paraspinal and other soft tissues: Paraspinous soft tissues demonstrate no acute finding. Chronic fatty atrophy noted within the posterior paraspinous musculature. Visualized visceral structures within normal limits. Disc levels: T12-L1: Seen only on sagittal projection. Trace anterolisthesis. Mild diffuse disc bulge with disc desiccation. Moderate bilateral facet hypertrophy, right greater than left. No spinal stenosis. Mild left with moderate right foraminal narrowing. Appearance is stable. L1-2: Minimal annular disc bulge. Mild to moderate facet hypertrophy. No stenosis. L2-3: Retrolisthesis. Mild circumferential disc bulge with disc desiccation and intervertebral disc space narrowing. Moderate facet and ligament flavum hypertrophy. Resultant mild spinal stenosis. Mild left with moderate right L2 foraminal narrowing. Appearance unchanged. L3-4: Diffuse disc bulge with disc desiccation and intervertebral disc space narrowing. Disc bulging greatest within the extraforaminal regions. Associated mild osteophytic endplate spurring. Moderate  facet and ligament flavum hypertrophy. Resultant mild spinal stenosis with mild right lateral recess narrowing, stable. Moderate right with mild left L3 foraminal narrowing, unchanged. L4-5: Chronic anterolisthesis. Associated degenerative intervertebral disc space narrowing with disc desiccation. Associated broad posterior pseudo disc bulge, asymmetric to the right. Severe facet arthrosis, left slightly worse than right. Ligamentum flavum hypertrophy. Resultant severe canal with left greater than right lateral recess stenosis. Moderate to severe bilateral L4 foraminal narrowing, stable. L5-S1: Chronic anterolisthesis. Associated disc desiccation with intervertebral disc space narrowing with broad posterior disc bulge/pseudo disc bulge, slightly asymmetric to the right. Severe facet arthrosis with ligamentum flavum hypertrophy. Resultant severe spinal stenosis  with bilateral recess narrowing. Moderate to severe bilateral L5 foraminal stenosis, stable. S1-2: Fully formed disc space. Tiny central disc protrusion minimally indents the ventral thecal sac. No significant stenosis. IMPRESSION: 1. Stable appearance of the lumbar spine as compared to recent MRI from 10/21/2018. No acute abnormality. 2. Severe multifactorial spinal stenosis at L4-5 and L5-S1 with severe lateral recess narrowing. Moderate to severe bilateral L4 and L5 foraminal narrowing at these levels as above. 3. More mild spinal stenosis at L2-3 and L3-4 with moderate right-sided foraminal narrowing. 4. Chronic L1 compression fracture, stable. Electronically Signed   By: Rise Mu [name]   On: 11/15/2018 06:31   Mr Lumbar Spine Wo Contrast  Result Date: 10/21/2018 CLINICAL DATA:  85 year old female with severe pain radiating to the hips and legs, worse on the right. EXAM: MRI LUMBAR SPINE WITHOUT CONTRAST TECHNIQUE: Multiplanar, multisequence MR imaging of the lumbar spine was performed. No intravenous contrast was administered. COMPARISON:  Lumbar radiographs 1014 hours today. Abdomen MRI 06/23/2018, and earlier. FINDINGS: Segmentation: Nearly full size S1-S2 disc space, but otherwise normal segmentation with 5 lumbar type vertebral bodies and sacralized S1 level. Same numbering as on the plain films today. Alignment: Mild levoconvex lumbar scoliosis. Chronic grade 1 anterolisthesis at L4-L5 and L5-S1 is stable to mildly increased since 2016. Chronic mild retrolisthesis of L2 on L3 is stable. Vertebrae: No marrow edema or evidence of acute osseous abnormality. Chronic L1 compression fracture. Degenerative endplate signal changes in the lumbar spine. Intact visible sacrum and SI joints. Conus medullaris and cauda equina: Conus extends to the L1-L2 level. No lower spinal cord or conus signal abnormality. Paraspinal and other soft tissues: Stable visible abdomen. Negative paraspinal soft tissues  aside from posterior subcutaneous edema. Disc levels: No lower thoracic spinal stenosis. T12-L1: Mild grade 1 anterolisthesis with mild to moderate facet and ligament flavum hypertrophy. Mild disc bulge. No spinal stenosis. Mild left and moderate right T12 foraminal stenosis. L1-L2:  Mild to moderate facet hypertrophy.  No stenosis. L2-L3: Chronic disc space loss and mild retrolisthesis. Circumferential disc bulge with mild to moderate facet and ligament flavum hypertrophy. Mild spinal stenosis. Mild left and moderate right L2 foraminal stenosis. L3-L4: Chronic disc space loss. Vacuum disc here in 2016. Circumferential but mostly far lateral disc bulging and endplate spurring. Moderate facet and ligament flavum hypertrophy. Mild spinal and right lateral recess stenosis (right L4 nerve level). Mild left and moderate right L3 foraminal stenosis. L4-L5: Chronic anterolisthesis, disc space loss and vacuum disc. Circumferential disc/pseudo disc with severe facet and ligament flavum hypertrophy. Severe spinal stenosis with left greater than right lateral recess stenosis (L5 nerve levels). Moderate to severe bilateral L4 foraminal stenosis. L5-S1: Chronic anterolisthesis with disc space loss and vacuum disc. Circumferential disc/pseudo disc. Severe facet and ligament flavum hypertrophy. Severe spinal and bilateral lateral recess stenosis (S1 nerve levels). Moderate  left and moderate to severe right L5 foraminal stenosis. S1-S2: Full size disc space, otherwise negative with no stenosis. IMPRESSION: 1. No acute osseous abnormality. Chronic lumbar spondylolisthesis. Chronic L1 compression fracture. 2. Severe multifactorial spinal stenosis at L4-L5 and L5-S1, with severe lateral recess stenosis worse at the latter. Moderate to severe bilateral L5 foraminal stenosis at both levels. 3. Mild spinal stenosis at L2-L3 and L3-L4 with up to moderate right neural foraminal stenosis. Electronically Signed   By: Odessa FlemingH  Hall [name]   On:  10/21/2018 17:32   Dg Chest Port 1 View  Result Date: 11/15/2018 CLINICAL DATA:  History of fever, history of rheumatoid arthritis and limited mobility, increasing drowsiness today, some difficulty swallowing and speaking EXAM: PORTABLE CHEST 1 VIEW COMPARISON:  Chest x-ray of 07/23/2018 and CT chest of 09/05/2017 FINDINGS: The patient did not take a deep inspiration, but no pneumonia or effusion is currently seen. Mediastinal and hilar contours are stable with ectasia of the descending thoracic aorta. Moderate cardiomegaly is stable. There are degenerative changes in both shoulders. IMPRESSION: Poor inspiration but no active lung disease. Stable moderate cardiomegaly. Electronically Signed   By: Dwyane DeePaul  Barry [name]   On: 11/15/2018 16:52   Dg Hip Unilat W Or Wo Pelvis 2-3 Views Right  Result Date: 10/21/2018 CLINICAL DATA:  BILATERAL hip and leg pain EXAM: DG HIP (WITH OR WITHOUT PELVIS) 2-3V RIGHT COMPARISON:  Pelvic radiograph 01/24/2015, CT abdomen and pelvis 06/12/2014 FINDINGS: Austin mineralization. Narrowing of hip joints bilaterally. SI joints preserved. No acute fracture or dislocation. Bubbly well-circumscribed lytic lesion at the proximal LEFT femoral diaphysis lesion again identified, 4.7 x 2.0 cm, present and not significantly changed since at least 01/24/2015 and present on the prior CT exam from 2015 as well, generally benign in character. Scattered atherosclerotic calcifications. IMPRESSION: Degenerative changes of the hip joints bilaterally. No acute bony abnormalities. Chronic benign-appearing lytic lesion at proximal LEFT femoral diaphysis not significantly since 01/24/2015. Electronically Signed   By: Ulyses SouthwardMark  Boles [name]   On: 10/21/2018 10:37    Microbiology: Recent Results (from the past 240 hour(s))  Culture, blood (routine x 2)     Status: None (Preliminary result)   Collection Time: 11/14/18  8:47 PM  Result Value Ref Range Status   Specimen Description BLOOD BLOOD LEFT FOREARM   Final   Special Requests   Final    BOTTLES DRAWN AEROBIC AND ANAEROBIC Blood Culture adequate volume   Culture   Final    NO GROWTH 3 DAYS Performed at [facility], [address] [place], [place] [address]    Report Status PENDING  Incomplete  Urine culture     Status: None   Collection Time: 11/14/18  8:52 PM  Result Value Ref Range Status   Specimen Description URINE, RANDOM  Final   Special Requests NONE  Final   Culture   Final    NO GROWTH Performed at [facility], [address] [place], [place] [address]    Report Status 11/16/2018 FINAL  Final  Culture, blood (routine x 2)     Status: None (Preliminary result)   Collection Time: 11/14/18  9:03 PM  Result Value Ref Range Status   Specimen Description BLOOD LEFT WRIST  Final   Special Requests   Final    BOTTLES DRAWN AEROBIC AND ANAEROBIC Blood Culture adequate volume   Culture   Final    NO GROWTH 3 DAYS Performed at [facility], [address]  [place], [place] [address]    Report Status PENDING  Incomplete  Culture, blood (routine x 2)     Status: None (Preliminary result)   Collection Time: 11/15/18  7:56 AM  Result Value Ref Range Status   Specimen Description BLOOD LEFT HAND  Final   Special Requests   Final    BOTTLES DRAWN AEROBIC AND ANAEROBIC Blood Culture adequate volume   Culture   Final    NO GROWTH 2 DAYS Performed at [facility], [address] [place], [place] [address]    Report Status PENDING  Incomplete  Culture, blood (routine x 2)     Status: None (Preliminary result)   Collection Time: 11/15/18  8:29 AM  Result Value Ref Range Status   Specimen Description BLOOD RIGHT ANTECUBITAL  Final   Special Requests   Final    BOTTLES DRAWN AEROBIC AND ANAEROBIC Blood Culture adequate volume   Culture   Final    NO GROWTH 2 DAYS Performed at [facility], [address] [place], [place] [address]    Report Status PENDING  Incomplete     Labs: Basic  Metabolic Panel: Recent Labs  Lab 11/14/18 1635 11/15/18 0508 11/15/18 1830 11/16/18 0116  NA 137 135 136 136  K 4.1 3.3* 4.1 4.1  CL 106 103 103 106  CO2 24 24 24 23   GLUCOSE 94 80 166* 150*  BUN 15 12 16 22   CREATININE 0.90 1.01* 1.16* 1.17*  CALCIUM 8.6* 8.4* 8.6* 8.6*  MG  --  1.6*  --   --    Liver Function Tests: Recent Labs  Lab 11/15/18 0508  AST 18  ALT 12  ALKPHOS 45  BILITOT 1.0  PROT 5.4*  ALBUMIN 2.8*   No results for input(s): LIPASE, AMYLASE in the last 168 hours. Recent Labs  Lab 11/15/18 1830  AMMONIA 10   CBC: Recent Labs  Lab 11/14/18 1635 11/15/18 0508 11/15/18 1830 11/16/18 0116  WBC 6.4 10.5 10.0 11.9*  NEUTROABS 4.1 7.4  --   --   HGB 10.1* 10.2* 10.3* 9.6*  HCT 31.6* 31.3* 32.3* 29.7*  MCV 98.8 97.8 95.6 94.3  PLT 272 238 226 233   Cardiac Enzymes: No results for input(s): CKTOTAL, CKMB, CKMBINDEX, TROPONINI in the last 168 hours. BNP: BNP (last 3 results) Recent Labs    05/28/18 1614 06/22/18 2153 07/23/18 2100  BNP 2,077.6* 1,146.7* 138.3*    ProBNP (last 3 results) No results for input(s): PROBNP in the last 8760 hours.  CBG: Recent Labs  Lab 11/16/18 1633 11/16/18 2116 11/17/18 0626 11/17/18 0752 11/17/18 1140  GLUCAP 172* 116* 105* 94 120*    Principal Problem:   Low back pain Active Problems:   Type 2 diabetes mellitus with hyperlipidemia (HCC), patient declines statin   CAD (coronary artery disease)   Rheumatoid arthritis involving multiple sites (HCC)   Anemia of chronic disease   CKD stage 3 due to type 2 diabetes mellitus (HCC)   Chronic combined systolic and diastolic CHF (congestive heart failure) (HCC)   Deep vein thrombosis (DVT) of distal vein of right lower extremity (HCC)   Acute pain   Acute gout   Time coordinating discharge: 35 minutes  Signed:  Madelyn Flavorsondell Nikcole Eischeid, MD [facility] 11/17/2018, 2:15 PM

## 2018-11-17 NOTE — Progress Notes (Signed)
Patient discharging to [facility]. Report called in to [facility] LPN. Awaiting [facility] to transport.

## 2018-11-17 NOTE — Clinical Social Work Placement (Signed)
   CLINICAL SOCIAL WORK PLACEMENT  NOTE  Date:  11/17/2018  Patient Details  Name: [name] [name] MRN: [id number] Date of Birth: 1929-12-12  Clinical Social Work is seeking post-discharge placement for this patient at the Skilled  Nursing Facility level of care (*CSW will initial, date and re-position this form in  chart as items are completed):      Patient/family provided with [facility] Health Clinical Social Work Department's list of facilities offering this level of care within the geographic area requested by the patient (or if unable, by the patient's family).  Yes   Patient/family informed of their freedom to choose among providers that offer the needed level of care, that participate in Medicare, Medicaid or managed care program needed by the patient, have an available bed and are willing to accept the patient.      Patient/family informed of [facility]'s ownership interest in [facility] and [facility], as well as of the fact that they are under no obligation to receive care at these facilities.  PASRR submitted to EDS on       PASRR number received on 11/16/18     Existing PASRR number confirmed on       FL2 transmitted to all facilities in geographic area requested by pt/family on 11/16/18     FL2 transmitted to all facilities within larger geographic area on 11/16/18     Patient informed that his/her managed care company has contracts with or will negotiate with certain facilities, including the following:        Yes   Patient/family informed of bed offers received.  Patient chooses bed at [facility]     Physician recommends and patient chooses bed at      Patient to be transferred to [facility] on 11/17/18.  Patient to be transferred to facility by [facility]     Patient family notified on 11/17/18 of transfer.  Name of family member notified:  Lattie Corns and Elease Hashimoto (daughter)     PHYSICIAN       Additional Comment:     _______________________________________________ Gildardo Griffes, LCSW 11/17/2018, 2:22 PM

## 2018-11-17 NOTE — Progress Notes (Addendum)
Progress Note    [name] [name]  WYO:[id number] DOB: June 20, 1930  DOA: 11/14/2018 PCP: Helane Rima, DO    Brief Narrative:   Chief complaint:   Medical records reviewed and are as summarized below:  [name] [name] is an 85 y.o. female RA, gout, diabetes mellitus type 2, diastolic CHF, PE on Xarelto, history of CVA, iron deficiency, hypertension, nonobstructive CAD.  Patient admitted last month for low back pain with MRI showing degenerative disc disease and eventually discharged to rehab.    Assessment/Plan:   Principal Problem:   Low back pain Active Problems:   Type 2 diabetes mellitus with hyperlipidemia (HCC), patient declines statin   CAD (coronary artery disease)   Rheumatoid arthritis involving multiple sites (HCC)   Anemia of chronic disease   CKD stage 3 due to type 2 diabetes mellitus (HCC)   Chronic combined systolic and diastolic CHF (congestive heart failure) (HCC)   Deep vein thrombosis (DVT) of distal vein of right lower extremity (HCC)   Acute pain   Acute gout  Low back pain: Repeat MRI of the L-spine shows no acute changes from December imaging showing severe spinal stenosis of L4-L5 with lateral recess narrowing and compression fracture.   - Continue physical therapy  Fever: Patient initially reported complaints of fever.  No fevers documented since admission.  No influenza screen appears to have been obtained.  UA and chest x-ray showed no acute findings.  Fever could have been related with flare of rheumatoid arthritis.  Altered mental status: Patient appears to have some aspect of dementia with poor memory.  Family and notes that she lives alone and are wanting to have her placed in a skilled nursing facility.  Suspected rheumatoid arthritis flare: Normally on 10 mg of prednisone daily. - Continue leflunomide and Plaquenil - Continue increased dose of prednisone for now  Essential hypertension - Continue Entresto, Imdur, hydralazine, and Coreg  as tolerated  Diastolic CHF: Last EF October 2019 was 60-65%.  [name] Lasix was put on hold as altered mental status was thought to be related with dehydration.  Patient had been treated with normal saline IV fluids 50 mL/h on 1/7 overnight until mid afternoon. - Discontinue IV fluids - Continue to hold Lasix for now  History of PE - Continue Xarelto  History of CVA - Continue Xarelto  Anemia of chronic disease: Stable - Continue to monitor  CAD: Nonobstructive per cath in July 2019. Patient without chest pain complaints.  Body mass index is 29.97 kg/m.   Family Communication/Anticipated D/C date and plan/Code Status   DVT prophylaxis: Lovenox ordered. Code Status: Full Code.  Family Communication: Discussed plan of care with granddaughters present at bedside Disposition Plan: Discharge to skilled nursing facility in a.m.   Medical Consultants:    None.   Anti-Infectives:    None  Subjective:   Patient reports that she feels somewhat better today and has not complained of pain.  She had a good appetite and ate breakfast and lunch.  Objective:    Vitals:   11/16/18 1336 11/16/18 1636 11/17/18 0432 11/17/18 0537  BP: (!) 92/58 128/72 118/65   Pulse: 83 85 88   Resp:   14   Temp: 98.2 F (36.8 C) 98.4 F (36.9 C) 99.4 F (37.4 C)   TempSrc: Oral Oral Oral   SpO2: 99% 99% 100%   Weight:    79.2 kg  Height:        Intake/Output Summary (Last 24 hours) at  11/17/2018 0624 Last data filed at 11/16/2018 1900 Gross per 24 hour  Intake 1420.03 ml  Output 450 ml  Net 970.03 ml   Filed Weights   11/14/18 1549 11/16/18 0500 11/17/18 0537  Weight: 78.9 kg 79.4 kg 79.2 kg    Exam: Constitutional: Early female currently in NAD, calm, comfortable Eyes: PERRL, lids and conjunctivae normal ENMT: Mucous membranes are moist. Posterior pharynx clear of any exudate or lesions. Neck: normal, supple, no masses, no thyromegaly Respiratory: clear to auscultation  bilaterally, no wheezing, no crackles. Normal respiratory effort. No accessory muscle use.  Cardiovascular: Regular rate and rhythm, no murmurs / rubs / gallops. No extremity edema. 2+ pedal pulses. No carotid bruits.  Abdomen: no tenderness, no masses palpated. No hepatosplenomegaly. Bowel sounds positive.  Musculoskeletal: no clubbing / cyanosis. No joint deformity upper and lower extremities. Good ROM, no contractures. Normal muscle tone.  Skin: no rashes, lesions, ulcers. No induration Neurologic: CN 2-12 grossly intact. Sensation intact, DTR normal. Strength 4/5 Psychiatric: Normal judgment and insight. Alert and oriented x 2. Normal mood.    Data Reviewed:   I have personally reviewed following labs and imaging studies:  Labs: Labs show the following:   Basic Metabolic Panel: Recent Labs  Lab 11/14/18 1635 11/15/18 0508 11/15/18 1830 11/16/18 0116  NA 137 135 136 136  K 4.1 3.3* 4.1 4.1  CL 106 103 103 106  CO2 24 24 24 23   GLUCOSE 94 80 166* 150*  BUN 15 12 16 22   CREATININE 0.90 1.01* 1.16* 1.17*  CALCIUM 8.6* 8.4* 8.6* 8.6*  MG  --  1.6*  --   --    GFR Estimated Creatinine Clearance: 35.1 mL/min (A) (by C-G formula based on SCr of 1.17 mg/dL (H)). Liver Function Tests: Recent Labs  Lab 11/15/18 0508  AST 18  ALT 12  ALKPHOS 45  BILITOT 1.0  PROT 5.4*  ALBUMIN 2.8*   No results for input(s): LIPASE, AMYLASE in the last 168 hours. Recent Labs  Lab 11/15/18 1830  AMMONIA 10   Coagulation profile Recent Labs  Lab 11/14/18 1635  INR 1.43    CBC: Recent Labs  Lab 11/14/18 1635 11/15/18 0508 11/15/18 1830 11/16/18 0116  WBC 6.4 10.5 10.0 11.9*  NEUTROABS 4.1 7.4  --   --   HGB 10.1* 10.2* 10.3* 9.6*  HCT 31.6* 31.3* 32.3* 29.7*  MCV 98.8 97.8 95.6 94.3  PLT 272 238 226 233   Cardiac Enzymes: No results for input(s): CKTOTAL, CKMB, CKMBINDEX, TROPONINI in the last 168 hours. BNP (last 3 results) No results for input(s): PROBNP in the last  8760 hours. CBG: Recent Labs  Lab 11/15/18 2236 11/16/18 0618 11/16/18 1158 11/16/18 1633 11/16/18 2116  GLUCAP 217* 141* 127* 172* 116*   D-Dimer: No results for input(s): DDIMER in the last 72 hours. Hgb A1c: No results for input(s): HGBA1C in the last 72 hours. Lipid Profile: No results for input(s): CHOL, HDL, LDLCALC, TRIG, CHOLHDL, LDLDIRECT in the last 72 hours. Thyroid function studies: Recent Labs    11/15/18 0508  TSH 1.095   Anemia work up: No results for input(s): VITAMINB12, FOLATE, FERRITIN, TIBC, IRON, RETICCTPCT in the last 72 hours. Sepsis Labs: Recent Labs  Lab 11/14/18 1635 11/14/18 2055 11/14/18 2323 11/15/18 0508 11/15/18 1830 11/16/18 0116  WBC 6.4  --   --  10.5 10.0 11.9*  LATICACIDVEN  --  0.65 0.80  --   --   --     Microbiology Recent Results (  from the past 240 hour(s))  Culture, blood (routine x 2)     Status: None (Preliminary result)   Collection Time: 11/14/18  8:47 PM  Result Value Ref Range Status   Specimen Description BLOOD BLOOD LEFT FOREARM  Final   Special Requests   Final    BOTTLES DRAWN AEROBIC AND ANAEROBIC Blood Culture adequate volume   Culture   Final    NO GROWTH 2 DAYS Performed at [facility], [address], [place], [place] [address]    Report Status PENDING  Incomplete  Urine culture     Status: None   Collection Time: 11/14/18  8:52 PM  Result Value Ref Range Status   Specimen Description URINE, RANDOM  Final   Special Requests NONE  Final   Culture   Final    NO GROWTH Performed at [facility], [address], [place], [place] [address]    Report Status 11/16/2018 FINAL  Final  Culture, blood (routine x 2)     Status: None (Preliminary result)   Collection Time: 11/14/18  9:03 PM  Result Value Ref Range Status   Specimen Description BLOOD LEFT WRIST  Final   Special Requests   Final    BOTTLES DRAWN AEROBIC AND ANAEROBIC Blood Culture adequate volume   Culture   Final    NO GROWTH 2  DAYS Performed at [facility], [address] [place], [place] [address]    Report Status PENDING  Incomplete  Culture, blood (routine x 2)     Status: None (Preliminary result)   Collection Time: 11/15/18  7:56 AM  Result Value Ref Range Status   Specimen Description BLOOD LEFT HAND  Final   Special Requests   Final    BOTTLES DRAWN AEROBIC AND ANAEROBIC Blood Culture adequate volume   Culture   Final    NO GROWTH 1 DAY Performed at [facility], [address] [place], [place] [address]    Report Status PENDING  Incomplete  Culture, blood (routine x 2)     Status: None (Preliminary result)   Collection Time: 11/15/18  8:29 AM  Result Value Ref Range Status   Specimen Description BLOOD RIGHT ANTECUBITAL  Final   Special Requests   Final    BOTTLES DRAWN AEROBIC AND ANAEROBIC Blood Culture adequate volume   Culture   Final    NO GROWTH 1 DAY Performed at [facility], [address], [place], [place] [address]    Report Status PENDING  Incomplete    Procedures and diagnostic studies:  Dg Chest Port 1 View  Result Date: 11/15/2018 CLINICAL DATA:  History of fever, history of rheumatoid arthritis and limited mobility, increasing drowsiness today, some difficulty swallowing and speaking EXAM: PORTABLE CHEST 1 VIEW COMPARISON:  Chest x-ray of 07/23/2018 and CT chest of 09/05/2017 FINDINGS: The patient did not take a deep inspiration, but no pneumonia or effusion is currently seen. Mediastinal and hilar contours are stable with ectasia of the descending thoracic aorta. Moderate cardiomegaly is stable. There are degenerative changes in both shoulders. IMPRESSION: Poor inspiration but no active lung disease. Stable moderate cardiomegaly. Electronically Signed   By: Dwyane Dee [name]   On: 11/15/2018 16:52    Medications:   . acetaminophen  1,000 mg Oral TID  . allopurinol  100 mg Oral QHS  . carvedilol  25 mg Oral BID WC  . cholecalciferol  1,000 Units Oral Daily    .  feeding supplement (ENSURE ENLIVE)  237 mL Oral QPM  . gabapentin  100 mg Oral QHS  . hydrALAZINE  100 mg Oral Q8H  . hydroxychloroquine  200 mg Oral BID  . insulin aspart  0-9 Units Subcutaneous TID WC  . isosorbide mononitrate  30 mg Oral QHS  . leflunomide  20 mg Oral Daily  . polyvinyl alcohol  1 drop Both Eyes Daily  . predniSONE  20 mg Oral Q breakfast  . rivaroxaban  20 mg Oral Q supper  . senna  1 tablet Oral Daily  . vitamin C  500 mg Oral Daily   Continuous Infusions: . azithromycin Stopped (11/16/18 1857)  . cefTRIAXone (ROCEPHIN)  IV 1 g (11/16/18 1650)     LOS: 0 days   Holly Pring A Brandyn Lowrey  [facility]   *Please refer to [email or web address], password [email or web address] to get updated schedule on who will round on this patient, as hospitalists switch teams weekly. If 7PM-7AM, please contact night-coverage at [email or web address], password [email or web address] for any overnight needs.

## 2018-11-17 NOTE — Progress Notes (Signed)
Patient will DC to: [facility] Anticipated DC date: Family notified: 11/17/2018 Transport by: Sharin Mons  Per MD patient ready for DC to [facility] . RN, patient, patient's family, and facility notified of DC. Discharge Summary sent to facility. RN given number for report [phone] Room 506. DC packet on chart. Ambulance transport requested for patient.  CSW signing off.  [place], [place] [phone]

## 2018-11-18 ENCOUNTER — Non-Acute Institutional Stay (SKILLED_NURSING_FACILITY): Payer: Medicare Other | Admitting: Internal Medicine

## 2018-11-18 ENCOUNTER — Other Ambulatory Visit: Payer: Self-pay

## 2018-11-18 ENCOUNTER — Encounter: Payer: Self-pay | Admitting: Internal Medicine

## 2018-11-18 DIAGNOSIS — M48061 Spinal stenosis, lumbar region without neurogenic claudication: Secondary | ICD-10-CM | POA: Diagnosis not present

## 2018-11-18 DIAGNOSIS — G9340 Encephalopathy, unspecified: Secondary | ICD-10-CM | POA: Diagnosis not present

## 2018-11-18 DIAGNOSIS — M545 Low back pain, unspecified: Secondary | ICD-10-CM

## 2018-11-18 DIAGNOSIS — I2609 Other pulmonary embolism with acute cor pulmonale: Secondary | ICD-10-CM

## 2018-11-18 DIAGNOSIS — Z8673 Personal history of transient ischemic attack (TIA), and cerebral infarction without residual deficits: Secondary | ICD-10-CM

## 2018-11-18 DIAGNOSIS — M1A09X1 Idiopathic chronic gout, multiple sites, with tophus (tophi): Secondary | ICD-10-CM | POA: Diagnosis not present

## 2018-11-18 DIAGNOSIS — I824Z1 Acute embolism and thrombosis of unspecified deep veins of right distal lower extremity: Secondary | ICD-10-CM

## 2018-11-18 DIAGNOSIS — M0579 Rheumatoid arthritis with rheumatoid factor of multiple sites without organ or systems involvement: Secondary | ICD-10-CM

## 2018-11-18 NOTE — Progress Notes (Signed)
:  Location:  [facility] Room Number: [phone] Place of Service:  SNF (31)  Randon Goldsmith. Lyn Hollingshead, MD  Patient Care Team: Helane Rima, DO as PCP - General (Family Medicine) Chilton Si, MD as PCP - Cardiology (Cardiology) Arlean Hopping, MD as Rounding Team (Internal Medicine)  Extended Emergency Contact Information Primary Emergency Contact: Hampton,Jeannette Address: [address]          [place], [place] [address] Darden [place] of [place] Home Phone: [phone] Relation: Daughter Secondary Emergency Contact: [name] Address: [address]          [place], [place] [address] [place] of [place] Mobile Phone: [phone] Relation: Daughter     Allergies: Clonidine derivatives; Fish allergy; Shellfish allergy; Doxycycline; Indomethacin; Lyrica [pregabalin]; Methyldopa; Morphine and related; Orange fruit [citrus]; Strawberry (diagnostic); Cetirizine hcl; Codeine; Levaquin [levofloxacin in d5w]; and Tomato  Chief Complaint  Patient presents with  . New Admit To SNF    Admit to [facility]    HPI: Patient is 85 y.o. female with RA, gout, diabetes mellitus type 2, diastolic congestive heart failure, PE on Xarelto, history of CVA, iron deficiency, hypertension, nonobstructive CAD, who was recently admitted for low back pain.  At that that time the MRI showed degenerative disc disease of lumbar spine and lumbar stenosis.  Patient was discharged to rehab and was discharged from rehab back to home several days ago.  Last few days patient has been having increasing pain that acutely worsened over the last 48 hours, unable to bear weight on exam and feels weak in the right leg particularly.  Denies any incontinence of urine or bowel, fever for the last 24 hours, no productive cough or chest pain.In the ED patient was mildly febrile.  Patient was admitted to [facility] from 1/6[facility]9 for management of low back pain and mild fever and  weakness of the right lower extremity.  Patient was reported complaints of fever but there were no fevers documented since admission.  UA and chest x-ray showed no acute findings.  Patient was suspected of having an acute rheumatoid arthritis flare.  Patient normally on 10 mg of prednisone but has been out of this at home so the doses of prednisone was increased to 20 mg and then did not will be decreased back to 10 mg on discharge.  Patient is admitted to SNF for OT/PT.  While at skilled nursing facility patient will be followed for history of PE/DVT treated with Xarelto, gout treated with allopurinol, and RA treated with Arava, Plaquenil, and prednisone.  Past Medical History:  Diagnosis Date  . Allergic rhinitis due to pollen 04/27/2007  . Arthritis    "in q joint" (08/07/2013)  . CHF (congestive heart failure) (HCC)   . Coronary atherosclerosis of native coronary artery   . Cough   . Disorder of bone and cartilage, unspecified   . Edema 05/03/2013  . Exertional shortness of breath    "sometimes" (08/07/2013)  . First degree atrioventricular block   . Gout, unspecified 04/19/2013  . Hyperlipidemia 04/27/2007  . Hypertension   . Midline low back pain without sciatica 06/27/2014  . Muscle spasm   . Muscle weakness (generalized)   . Myocardial infarction (HCC) ~ 1970  . Onychia and paronychia of toe   . Osteoarthrosis, unspecified whether generalized or localized, unspecified site 04/27/2007  . Other fall   . Other malaise and fatigue   . Pneumonia 05/2018  . Prepatellar bursitis   .  Pulmonary embolism (HCC) 07/2018  . Rheumatic nodule 06/28/2017  . Seizures (HCC)   . Stroke (HCC) 01/17/2014  . TIA (transient ischemic attack)    "a few one summer" (08/07/2013)  . Type II diabetes mellitus (HCC)    "fasting 90-110s" (08/07/2013)  . Unspecified essential hypertension 08/08/2013  . Unspecified vitamin D deficiency     Past Surgical History:  Procedure Laterality Date  . ABDOMINAL  HYSTERECTOMY  04/1980  . APPENDECTOMY  1960  . CARDIAC CATHETERIZATION  2003  . CATARACT EXTRACTION W/ INTRAOCULAR LENS  IMPLANT, BILATERAL Bilateral ~ 2012  . CHOLECYSTECTOMY N/A 06/26/2018   Procedure: LAPAROSCOPIC CHOLECYSTECTOMY POSSIBLE INTRAOPERATIVE CHOLANGIOGRAM;  Surgeon: Abigail Miyamoto, MD;  Location: [facility] OR;  Service: General;  Laterality: N/A;  . JOINT REPLACEMENT    . REPLACEMENT TOTAL KNEE Right 09/2005  . RIGHT/LEFT HEART CATH AND CORONARY ANGIOGRAPHY N/A 06/01/2018   Procedure: RIGHT/LEFT HEART CATH AND CORONARY ANGIOGRAPHY;  Surgeon: Corky Crafts, MD;  Location: [facility] INVASIVE CV LAB;  Service: Cardiovascular;  Laterality: N/A;  . SHOULDER OPEN ROTATOR CUFF REPAIR Right 07/1999  . TONSILLECTOMY  08/1974  . TOTAL KNEE ARTHROPLASTY Left 08/07/2013   Procedure: TOTAL KNEE ARTHROPLASTY- LEFT;  Surgeon: Dannielle Huh, MD;  Location: [facility] OR;  Service: Orthopedics;  Laterality: Left;  . TRANSTHORACIC ECHOCARDIOGRAM  2003   EF 55-65%; mild concentric LVH    Allergies as of 11/18/2018      Reactions   Clonidine Derivatives Swelling   [name]   Fish Allergy Diarrhea, Swelling, Other (See Comments)   Turns skin "black," but can tolerate white fish Salmon- Diarrhea   Shellfish Allergy Hives   Doxycycline Rash   Indomethacin Other (See Comments)   Reaction not recalled by the patient   Lyrica [pregabalin] Other (See Comments)   Hallucinations   Methyldopa Other (See Comments)   Aldomet (for hypertension): Reaction not recalled by the patient   Morphine And Related Other (See Comments)   Family reports it drops her bp that she needs iv fluids   Orange Fruit [citrus] Other (See Comments)   Indigestion/heartburn   Strawberry (diagnostic) Itching   Cetirizine Hcl Itching, Rash   Codeine Itching   Levaquin [levofloxacin In D5w] Rash   Tomato Rash      Medication List       Accurate as of November 18, 2018  9:50 AM. Always use your most recent med list.        albuterol 108 (90 Base) MCG/ACT inhaler Commonly known as:  PROVENTIL HFA;VENTOLIN HFA Inhale 1-2 puffs into the lungs every 6 (six) hours as needed for wheezing or shortness of breath.   allopurinol 100 MG tablet Commonly known as:  ZYLOPRIM Take 1 tablet (100 mg total) by mouth at bedtime.   carvedilol 25 MG tablet Commonly known as:  COREG Take 25 mg by mouth 2 (two) times daily with a meal.   D3-1000 25 MCG (1000 UT) capsule Generic drug:  Cholecalciferol Take 1 capsule (1,000 Units total) by mouth daily.   diclofenac sodium 1 % Gel Commonly known as:  VOLTAREN Apply 2 g topically 4 (four) times daily as needed.   ENSURE Take 237 mLs by mouth every evening.   furosemide 40 MG tablet Commonly known as:  LASIX Take 0.5 tablets (20 mg total) by mouth daily as needed for fluid or edema.   gabapentin 100 MG capsule Commonly known as:  NEURONTIN Take 1 capsule (100 mg  total) by mouth at bedtime.   hydrALAZINE 100 MG tablet Commonly known as:  APRESOLINE Take 1 tablet (100 mg total) by mouth 3 (three) times daily.   hydroxychloroquine 200 MG tablet Commonly known as:  PLAQUENIL Take 1 tablet (200 mg total) by mouth 2 (two) times daily.   hydroxypropyl methylcellulose / hypromellose 2.5 % ophthalmic solution Commonly known as:  ISOPTO TEARS / GONIOVISC Place 1 drop into both eyes daily.   isosorbide mononitrate 30 MG 24 hr tablet Commonly known as:  IMDUR Take 1 tablet (30 mg total) by mouth at bedtime.   leflunomide 20 MG tablet Commonly known as:  ARAVA Take 1 tablet (20 mg total) by mouth daily.   Olopatadine HCl 0.2 % Soln Place 1 drop into both eyes daily.   predniSONE 10 MG tablet Commonly known as:  DELTASONE Take 1 tablet (10 mg total) by mouth daily.   rivaroxaban 20 MG Tabs tablet Commonly known as:  XARELTO Take 1 tablet (20 mg total) by mouth daily with supper.   sacubitril-valsartan  49-51 MG Commonly known as:  ENTRESTO Take 1 tablet by mouth daily at 12 noon.   traMADol 50 MG tablet Commonly known as:  ULTRAM Take 2 tablets (100 mg total) by mouth every 8 (eight) hours.   triamcinolone cream 0.1 % Commonly known as:  KENALOG Apply 1 application topically 2 (two) times daily as needed (for itching- to affected sites).   vitamin C 500 MG tablet Commonly known as:  ASCORBIC ACID Take 1 tablet (500 mg total) by mouth daily.       No orders of the defined types were placed in this encounter.   Immunization History  Administered Date(s) Administered  . Influenza Whole 09/06/2012  . Influenza, High Dose Seasonal PF 09/13/2017  . Influenza,inj,Quad PF,6+ Mos 09/12/2013, 09/11/2014, 07/23/2015, 09/16/2016, 09/07/2018  . Pneumococcal Polysaccharide-23 04/13/2005, 11/09/2009, 09/12/2015  . Td 04/13/2005  . Zoster 11/10/2011    Social History   Tobacco Use  . Smoking status: Never Smoker  . Smokeless tobacco: Never Used  Substance Use Topics  . Alcohol use: No    Alcohol/week: 0.0 standard drinks    Family history is   Family History  Problem Relation Age of Onset  . Heart attack Father       Review of Systems  DATA OBTAINED: from patient, nurse GENERAL:  no fevers, fatigue, appetite changes SKIN: No itching, or rash EYES: No eye pain, redness, discharge EARS: No earache, tinnitus, change in hearing NOSE: No congestion, drainage or bleeding  MOUTH/THROAT: No mouth or tooth pain, No sore throat RESPIRATORY: No cough, wheezing, SOB CARDIAC: No chest pain, palpitations, lower extremity edema  GI: No abdominal pain, No N/V/D or constipation, No heartburn or reflux  GU: No dysuria, frequency or urgency, or incontinence  MUSCULOSKELETAL: No unrelieved bone/joint pain NEUROLOGIC: No headache, dizziness or focal weakness PSYCHIATRIC: No c/o anxiety or sadness   Vitals:   11/18/18 0949  BP: (!) 154/82  Pulse: 81  Resp: 18  Temp: 98.3 F (36.8  C)    SpO2 Readings from Last 1 Encounters:  11/17/18 99%   Body mass index is 29.7 kg/m.     Physical Exam  GENERAL APPEARANCE: Alert, conversant,  No acute distress.  SKIN: No diaphoresis rash HEAD: Normocephalic, atraumatic  EYES: Conjunctiva/lids clear. Pupils round, reactive. EOMs intact.  EARS: External exam WNL, canals clear. Hearing grossly normal.  NOSE: No deformity or discharge.  MOUTH/THROAT: Lips w/o lesions  RESPIRATORY: Breathing is  even, unlabored. Lung sounds are clear   CARDIOVASCULAR: Heart RRR no murmurs, rubs or gallops. No peripheral edema.   GASTROINTESTINAL: Abdomen is soft, non-tender, not distended w/ normal bowel sounds. GENITOURINARY: Bladder non tender, not distended  MUSCULOSKELETAL: No abnormal joints or musculature NEUROLOGIC:  Cranial nerves 2-12 grossly intact. Moves all extremities  PSYCHIATRIC: Mood and affect appropriate to situation, no behavioral issues  Patient Active Problem List   Diagnosis Date Noted  . Acute gout 11/15/2018  . Low back pain 11/14/2018  . Chronic back pain 10/27/2018  . Arthralgia of both knees 10/27/2018  . Rheumatoid arthritis involving both knees (HCC) 10/27/2018  . Acute pain 10/21/2018  . Physical deconditioning 09/06/2018  . Hyperlipidemia associated with type 2 diabetes mellitus (HCC), refuses statin 09/06/2018  . Refusal of statin medication by patient 09/04/2018  . Deep vein thrombosis (DVT) of distal vein of right lower extremity (HCC) 07/30/2018  . Hypertensive heart disease with heart failure (HCC) 07/30/2018  . Pulmonary embolism (HCC), 07/2018, on Xarelto with goal to DC after 6 months if more active 07/10/2018  . DCM (dilated cardiomyopathy) (HCC)   . Benign hypertensive heart and kidney disease with CHF and stage 3 chronic kidney disease (HCC) 05/28/2018  . Osteoarthritis 05/06/2018  . Hypokalemia 02/02/2018  . Adhesive capsulitis of right shoulder 10/12/2017  . Idiopathic chronic gout of  multiple sites with tophus 08/25/2017  . History of total knee arthroplasty, bilateral 02/18/2017  . History of rotator cuff surgery 02/18/2017  . High risk medications, long-term use 09/21/2016  . Chronic combined systolic and diastolic CHF (congestive heart failure) (HCC) 09/12/2015  . Bilateral edema of lower extremity 06/05/2014  . CKD stage 3 due to type 2 diabetes mellitus (HCC) 06/05/2014  . DM (diabetes mellitus), type 2 with renal complications (HCC) 06/05/2014    Class: Chronic  . Anemia of chronic disease 05/01/2014  . Rheumatoid arthritis involving multiple sites (HCC) 12/19/2013    Class: Chronic  . CAD (coronary artery disease) 10/18/2013  . Hypertension associated with diabetes (HCC) 08/08/2013    Class: Chronic  . Fibromyalgia 05/10/2013  . Type 2 diabetes mellitus with hyperlipidemia (HCC), patient declines statin 04/27/2007  . Allergic rhinitis 04/27/2007  . Diverticulosis 04/27/2007      Labs reviewed: Basic Metabolic Panel:    Component Value Date/Time   NA 136 11/16/2018 0116   NA 142 08/08/2018   K 4.1 11/16/2018 0116   CL 106 11/16/2018 0116   CO2 23 11/16/2018 0116   GLUCOSE 150 (H) 11/16/2018 0116   BUN 22 11/16/2018 0116   BUN 43 (A) 08/08/2018   CREATININE 1.17 (H) 11/16/2018 0116   CREATININE 1.19 (H) 07/21/2017 1454   CALCIUM 8.6 (L) 11/16/2018 0116   PROT 5.4 (L) 11/15/2018 0508   PROT 7.2 09/09/2015 1210   ALBUMIN 2.8 (L) 11/15/2018 0508   ALBUMIN 3.9 09/09/2015 1210   AST 18 11/15/2018 0508   ALT 12 11/15/2018 0508   ALKPHOS 45 11/15/2018 0508   BILITOT 1.0 11/15/2018 0508   BILITOT 0.5 09/09/2015 1210   GFRNONAA 42 (L) 11/16/2018 0116   GFRNONAA 42 (L) 07/21/2017 1454   GFRAA 49 (L) 11/16/2018 0116   GFRAA 48 (L) 07/21/2017 1454    Recent Labs    06/01/18 0459  06/27/18 0630  09/05/18 2018  10/22/18 0344  11/15/18 0508 11/15/18 1830 11/16/18 0116  NA 141  140   < > 140   < >  --    < > 137   < >  135 136 136  K 3.5  3.5    < > 3.5   < >  --    < > 3.6   < > 3.3* 4.1 4.1  CL 108  107   < > 104   < >  --    < > 104   < > 103 103 106  CO2 26  27   < > 26   < >  --    < > 24   < > 24 24 23   GLUCOSE 109*  110*   < > 108*   < >  --    < > 177*   < > 80 166* 150*  BUN 22  21   < > 17   < >  --    < > 17   < > 12 16 22   CREATININE 1.00  0.98   < > 1.10*   < >  --    < > 0.86   < > 1.01* 1.16* 1.17*  CALCIUM 8.9  8.9   < > 8.7*   < >  --    < > 8.6*   < > 8.4* 8.6* 8.6*  MG  --   --   --   --  1.8  --  1.9  --  1.6*  --   --   PHOS 2.7  --  2.7  --   --   --   --   --   --   --   --    < > = values in this interval not displayed.   Liver Function Tests: Recent Labs    08/29/18 1345 10/21/18 0954 11/15/18 0508  AST 17 20 18   ALT 10 11 12   ALKPHOS 47 41 45  BILITOT 0.3 1.0 1.0  PROT 6.7 5.9* 5.4*  ALBUMIN 3.7 3.3* 2.8*   Recent Labs    06/22/18 2153 06/23/18 0500 07/23/18 2100  LIPASE 150* 77* 50   Recent Labs    11/15/18 1830  AMMONIA 10   CBC: Recent Labs    10/21/18 0954  11/14/18 1635 11/15/18 0508 11/15/18 1830 11/16/18 0116  WBC 7.7   < > 6.4 10.5 10.0 11.9*  NEUTROABS 4.7  --  4.1 7.4  --   --   HGB 9.8*   < > 10.1* 10.2* 10.3* 9.6*  HCT 31.4*   < > 31.6* 31.3* 32.3* 29.7*  MCV 99.4   < > 98.8 97.8 95.6 94.3  PLT 247   < > 272 238 226 233   < > = values in this interval not displayed.   Lipid Recent Labs    05/29/18 0030 06/24/18 0524 06/25/18 0607  CHOL 178 189 202*  HDL 73 63 63  LDLCALC 92 113* 126*  TRIG 64 65 67    Cardiac Enzymes: Recent Labs    09/05/18 2018 09/06/18 0131 09/06/18 0716  TROPONINI 0.04* 0.03* 0.03*   BNP: Recent Labs    05/28/18 1614 06/22/18 2153 07/23/18 2100  BNP 2,077.6* 1,146.7* 138.3*   No results found for: [facility] Lab Results  Component Value Date   HGBA1C 5.9 (H) 06/25/2018   Lab Results  Component Value Date   TSH 1.095 11/15/2018   Lab Results  Component Value Date   VITAMINB12 419 10/21/2018   Lab  Results  Component Value Date   FOLATE >19.9 07/03/2014   Lab Results  Component Value Date   IRON 17 (L) 10/21/2018  TIBC 233 (L) 10/21/2018   FERRITIN 215 10/21/2018    Imaging and Procedures obtained prior to SNF admission: Mr Lumbar Spine Wo Contrast  Result Date: 11/15/2018 CLINICAL DATA:  Initial evaluation for acute rapidly progressive back pain. EXAM: MRI LUMBAR SPINE WITHOUT CONTRAST TECHNIQUE: Multiplanar, multisequence MR imaging of the lumbar spine was performed. No intravenous contrast was administered. COMPARISON:  Recent MRI from 10/21/2018 FINDINGS: Segmentation: Transitional lumbosacral anatomy with nearly fully formed S1-2 interspace. Same numbering system employed as on previous exam. Alignment: Mild levoconvex scoliosis. Chronic grade 1 anterolisthesis of L4 on L5 and L5 on S1, stable. Trace retrolisthesis of L2 on L3 also unchanged. Vertebrae: Chronic height loss with associated Schmorl's node at the superior endplate of L1, stable. Vertebral body heights otherwise maintained without evidence for acute or interval fracture. Bone marrow signal intensity within normal limits. No discrete or worrisome osseous lesions. No abnormal marrow edema. Reactive endplate changes at the superior endplate of L1, stable. Conus medullaris and cauda equina: Conus extends to the L2 level. Conus and cauda equina appear normal. Paraspinal and other soft tissues: Paraspinous soft tissues demonstrate no acute finding. Chronic fatty atrophy noted within the posterior paraspinous musculature. Visualized visceral structures within normal limits. Disc levels: T12-L1: Seen only on sagittal projection. Trace anterolisthesis. Mild diffuse disc bulge with disc desiccation. Moderate bilateral facet hypertrophy, right greater than left. No spinal stenosis. Mild left with moderate right foraminal narrowing. Appearance is stable. L1-2: Minimal annular disc bulge. Mild to moderate facet hypertrophy. No stenosis.  L2-3: Retrolisthesis. Mild circumferential disc bulge with disc desiccation and intervertebral disc space narrowing. Moderate facet and ligament flavum hypertrophy. Resultant mild spinal stenosis. Mild left with moderate right L2 foraminal narrowing. Appearance unchanged. L3-4: Diffuse disc bulge with disc desiccation and intervertebral disc space narrowing. Disc bulging greatest within the extraforaminal regions. Associated mild osteophytic endplate spurring. Moderate facet and ligament flavum hypertrophy. Resultant mild spinal stenosis with mild right lateral recess narrowing, stable. Moderate right with mild left L3 foraminal narrowing, unchanged. L4-5: Chronic anterolisthesis. Associated degenerative intervertebral disc space narrowing with disc desiccation. Associated broad posterior pseudo disc bulge, asymmetric to the right. Severe facet arthrosis, left slightly worse than right. Ligamentum flavum hypertrophy. Resultant severe canal with left greater than right lateral recess stenosis. Moderate to severe bilateral L4 foraminal narrowing, stable. L5-S1: Chronic anterolisthesis. Associated disc desiccation with intervertebral disc space narrowing with broad posterior disc bulge/pseudo disc bulge, slightly asymmetric to the right. Severe facet arthrosis with ligamentum flavum hypertrophy. Resultant severe spinal stenosis with bilateral recess narrowing. Moderate to severe bilateral L5 foraminal stenosis, stable. S1-2: Fully formed disc space. Tiny central disc protrusion minimally indents the ventral thecal sac. No significant stenosis. IMPRESSION: 1. Stable appearance of the lumbar spine as compared to recent MRI from 10/21/2018. No acute abnormality. 2. Severe multifactorial spinal stenosis at L4-5 and L5-S1 with severe lateral recess narrowing. Moderate to severe bilateral L4 and L5 foraminal narrowing at these levels as above. 3. More mild spinal stenosis at L2-3 and L3-4 with moderate right-sided foraminal  narrowing. 4. Chronic L1 compression fracture, stable. Electronically Signed   By: Rise Mu [name]   On: 11/15/2018 06:31   Dg Chest Port 1 View  Result Date: 11/15/2018 CLINICAL DATA:  History of fever, history of rheumatoid arthritis and limited mobility, increasing drowsiness today, some difficulty swallowing and speaking EXAM: PORTABLE CHEST 1 VIEW COMPARISON:  Chest x-ray of 07/23/2018 and CT chest of 09/05/2017 FINDINGS: The patient did not take a deep inspiration, but  no pneumonia or effusion is currently seen. Mediastinal and hilar contours are stable with ectasia of the descending thoracic aorta. Moderate cardiomegaly is stable. There are degenerative changes in both shoulders. IMPRESSION: Poor inspiration but no active lung disease. Stable moderate cardiomegaly. Electronically Signed   By: Dwyane Dee [name]   On: 11/15/2018 16:52     Not all labs, radiology exams or other studies done during hospitalization come through on my EPIC note; however they are reviewed by me.    Assessment and Plan  Low back pain/spinal stenosis-repeat MRI of the L-spine showed no acute changes from December SNF-patient admitted for OT/PT  Altered mental status-patient appears to have some aspect of dementia with poor memory; family notes state that she lives alone and are wanting to have her placed in a skilled nursing facility  Rheumatoid arthritis-suspected flare secondary to patient noncompliance with meds; patient was placed on 20 mg of prednisone until DC then back to patient's normal 10 mg daily SNF- continue prednisone 10 mg daily along with Plaquenil 200 mg and Arava 20 mg daily  History of PE/DVT SNF- continue Xarelto 20 mg daily  History of gout SNF- continue allopurinol 100 mg daily  History of CVA SNF-continue Xarelto 20 mg daily   Time spent greater than 45 minutes;> 50% of time with patient was spent reviewing records, labs, tests and studies, counseling and developing plan  of care  Thurston Hole D. Lyn Hollingshead, MD

## 2018-11-19 ENCOUNTER — Encounter: Payer: Self-pay | Admitting: Internal Medicine

## 2018-11-19 DIAGNOSIS — M48061 Spinal stenosis, lumbar region without neurogenic claudication: Secondary | ICD-10-CM | POA: Insufficient documentation

## 2018-11-19 DIAGNOSIS — G9340 Encephalopathy, unspecified: Secondary | ICD-10-CM | POA: Insufficient documentation

## 2018-11-19 DIAGNOSIS — Z8673 Personal history of transient ischemic attack (TIA), and cerebral infarction without residual deficits: Secondary | ICD-10-CM | POA: Insufficient documentation

## 2018-11-19 LAB — CULTURE, BLOOD (ROUTINE X 2)
Culture: NO GROWTH
Culture: NO GROWTH
Special Requests: ADEQUATE
Special Requests: ADEQUATE

## 2018-11-20 LAB — CULTURE, BLOOD (ROUTINE X 2)
Culture: NO GROWTH
Culture: NO GROWTH
Special Requests: ADEQUATE
Special Requests: ADEQUATE

## 2018-11-21 ENCOUNTER — Ambulatory Visit: Payer: Medicare Other | Admitting: Family Medicine

## 2018-11-21 DIAGNOSIS — M109 Gout, unspecified: Secondary | ICD-10-CM | POA: Diagnosis not present

## 2018-11-21 DIAGNOSIS — E1169 Type 2 diabetes mellitus with other specified complication: Secondary | ICD-10-CM | POA: Diagnosis not present

## 2018-11-21 DIAGNOSIS — N189 Chronic kidney disease, unspecified: Secondary | ICD-10-CM | POA: Diagnosis not present

## 2018-11-21 DIAGNOSIS — M069 Rheumatoid arthritis, unspecified: Secondary | ICD-10-CM | POA: Diagnosis not present

## 2018-11-22 ENCOUNTER — Telehealth (INDEPENDENT_AMBULATORY_CARE_PROVIDER_SITE_OTHER): Payer: Self-pay | Admitting: Rheumatology

## 2018-11-22 NOTE — Telephone Encounter (Signed)
Received call from pts daughter Para March, checking if we received request for records from [facility]. I advised her that we had not. She stated she would contact them and have them to call me

## 2018-11-23 ENCOUNTER — Other Ambulatory Visit: Payer: Self-pay | Admitting: Internal Medicine

## 2018-11-23 MED ORDER — TRAMADOL HCL 50 MG PO TABS: 100.0000 mg | ORAL_TABLET | Freq: Three times a day (TID) | ORAL | 0 refills | Status: DC

## 2018-11-23 MED ORDER — HYDROCODONE-ACETAMINOPHEN 5-325 MG PO TABS: 1.0000 | ORAL_TABLET | Freq: Three times a day (TID) | ORAL | 0 refills | Status: DC

## 2018-11-24 ENCOUNTER — Telehealth: Payer: Self-pay

## 2018-11-24 LAB — BASIC METABOLIC PANEL
BUN: 22 — AB (ref 4–21)
Creatinine: 0.9 (ref 0.5–1.1)
Glucose: 145
Potassium: 4.5 (ref 3.4–5.3)
Sodium: 136 — AB (ref 137–147)

## 2018-11-24 LAB — CBC AND DIFFERENTIAL
HCT: 26 — AB (ref 36–46)
Hemoglobin: 8.9 — AB (ref 12.0–16.0)
Platelets: 320 (ref 150–399)
WBC: 8

## 2018-11-24 NOTE — Telephone Encounter (Signed)
PPW received placed in your box for review.  

## 2018-11-25 ENCOUNTER — Encounter: Payer: Self-pay | Admitting: Internal Medicine

## 2018-11-25 ENCOUNTER — Ambulatory Visit: Payer: Self-pay | Admitting: *Deleted

## 2018-11-25 ENCOUNTER — Non-Acute Institutional Stay (SKILLED_NURSING_FACILITY): Payer: Medicare Other | Admitting: Internal Medicine

## 2018-11-25 DIAGNOSIS — I2510 Atherosclerotic heart disease of native coronary artery without angina pectoris: Secondary | ICD-10-CM

## 2018-11-25 DIAGNOSIS — M5440 Lumbago with sciatica, unspecified side: Secondary | ICD-10-CM

## 2018-11-25 DIAGNOSIS — Z79891 Long term (current) use of opiate analgesic: Secondary | ICD-10-CM

## 2018-11-25 DIAGNOSIS — Z86718 Personal history of other venous thrombosis and embolism: Secondary | ICD-10-CM

## 2018-11-25 DIAGNOSIS — Z7901 Long term (current) use of anticoagulants: Secondary | ICD-10-CM

## 2018-11-25 DIAGNOSIS — E1169 Type 2 diabetes mellitus with other specified complication: Secondary | ICD-10-CM

## 2018-11-25 DIAGNOSIS — Z8673 Personal history of transient ischemic attack (TIA), and cerebral infarction without residual deficits: Secondary | ICD-10-CM

## 2018-11-25 DIAGNOSIS — I440 Atrioventricular block, first degree: Secondary | ICD-10-CM

## 2018-11-25 DIAGNOSIS — G5793 Unspecified mononeuropathy of bilateral lower limbs: Secondary | ICD-10-CM | POA: Diagnosis not present

## 2018-11-25 DIAGNOSIS — Z86711 Personal history of pulmonary embolism: Secondary | ICD-10-CM

## 2018-11-25 DIAGNOSIS — M4316 Spondylolisthesis, lumbar region: Secondary | ICD-10-CM | POA: Diagnosis not present

## 2018-11-25 DIAGNOSIS — M109 Gout, unspecified: Secondary | ICD-10-CM

## 2018-11-25 DIAGNOSIS — Z7952 Long term (current) use of systemic steroids: Secondary | ICD-10-CM

## 2018-11-25 DIAGNOSIS — M4807 Spinal stenosis, lumbosacral region: Secondary | ICD-10-CM | POA: Diagnosis not present

## 2018-11-25 DIAGNOSIS — I5042 Chronic combined systolic (congestive) and diastolic (congestive) heart failure: Secondary | ICD-10-CM

## 2018-11-25 DIAGNOSIS — Z96653 Presence of artificial knee joint, bilateral: Secondary | ICD-10-CM

## 2018-11-25 DIAGNOSIS — N183 Chronic kidney disease, stage 3 (moderate): Secondary | ICD-10-CM

## 2018-11-25 DIAGNOSIS — M0609 Rheumatoid arthritis without rheumatoid factor, multiple sites: Secondary | ICD-10-CM | POA: Diagnosis not present

## 2018-11-25 DIAGNOSIS — E785 Hyperlipidemia, unspecified: Secondary | ICD-10-CM

## 2018-11-25 DIAGNOSIS — Z9181 History of falling: Secondary | ICD-10-CM

## 2018-11-25 DIAGNOSIS — I110 Hypertensive heart disease with heart failure: Secondary | ICD-10-CM

## 2018-11-25 DIAGNOSIS — D631 Anemia in chronic kidney disease: Secondary | ICD-10-CM

## 2018-11-25 DIAGNOSIS — Z7982 Long term (current) use of aspirin: Secondary | ICD-10-CM

## 2018-11-25 DIAGNOSIS — M4856XD Collapsed vertebra, not elsewhere classified, lumbar region, subsequent encounter for fracture with routine healing: Secondary | ICD-10-CM | POA: Diagnosis not present

## 2018-11-25 DIAGNOSIS — E1122 Type 2 diabetes mellitus with diabetic chronic kidney disease: Secondary | ICD-10-CM

## 2018-11-25 NOTE — Progress Notes (Signed)
:    Location:  [facility] Room Number: [phone] Place of Service:  SNF (31)  Randon Goldsmith. Lyn Hollingshead, MD  Patient Care Team: Helane Rima, DO as PCP - General (Family Medicine) Chilton Si, MD as PCP - Cardiology (Cardiology) Arlean Hopping, MD as Rounding Team (Internal Medicine)  Extended Emergency Contact Information Primary Emergency Contact: Hampton,Jeannette Address: [address]          [place], [place] [address] [place] of [place] Home Phone: [phone] Relation: Daughter Secondary Emergency Contact: [name] Address: [address]          [place], [place] [address] [place] of [place] Mobile Phone: [phone] Relation: Daughter     Allergies: Clonidine derivatives; Fish allergy; Shellfish allergy; Doxycycline; Indomethacin; Lyrica [pregabalin]; Methyldopa; Morphine and related; Orange fruit [citrus]; Strawberry (diagnostic); Cetirizine hcl; Codeine; Levaquin [levofloxacin in d5w]; and Tomato  Chief Complaint  Patient presents with  . Acute Visit    Leg Pain    HPI: Patient is 85 y.o. female who being seen for bilateral leg pain.  Patient states that pain is in her thighs, occasionally radiates to back, occasionally radiates to feet.  She admits that it is not sharp or burning.  It has been going on for a while was just occurring more frequently now nothing makes it better, nothing makes it worse.  Patient has had no recent injuries or falls.  Past Medical History:  Diagnosis Date  . Allergic rhinitis due to pollen 04/27/2007  . Arthritis    "in q joint" (08/07/2013)  . CHF (congestive heart failure) (HCC)   . Coronary atherosclerosis of native coronary artery   . Cough   . Disorder of bone and cartilage, unspecified   . Edema 05/03/2013  . Exertional shortness of breath    "sometimes" (08/07/2013)  . First degree atrioventricular block   . Gout, unspecified 04/19/2013  . Hyperlipidemia 04/27/2007    . Hypertension   . Midline low back pain without sciatica 06/27/2014  . Muscle spasm   . Muscle weakness (generalized)   . Myocardial infarction (HCC) ~ 1970  . Onychia and paronychia of toe   . Osteoarthrosis, unspecified whether generalized or localized, unspecified site 04/27/2007  . Other fall   . Other malaise and fatigue   . Pneumonia 05/2018  . Prepatellar bursitis   . Pulmonary embolism (HCC) 07/2018  . Rheumatic nodule 06/28/2017  . Seizures (HCC)   . Stroke (HCC) 01/17/2014  . TIA (transient ischemic attack)    "a few one summer" (08/07/2013)  . Type II diabetes mellitus (HCC)    "fasting 90-110s" (08/07/2013)  . Unspecified essential hypertension 08/08/2013  . Unspecified vitamin D deficiency     Past Surgical History:  Procedure Laterality Date  . ABDOMINAL HYSTERECTOMY  04/1980  . APPENDECTOMY  1960  . CARDIAC CATHETERIZATION  2003  . CATARACT EXTRACTION W/ INTRAOCULAR LENS  IMPLANT, BILATERAL Bilateral ~ 2012  . CHOLECYSTECTOMY N/A 06/26/2018   Procedure: LAPAROSCOPIC CHOLECYSTECTOMY POSSIBLE INTRAOPERATIVE CHOLANGIOGRAM;  Surgeon: Abigail Miyamoto, MD;  Location: [facility] OR;  Service: General;  Laterality: N/A;  . JOINT REPLACEMENT    . REPLACEMENT TOTAL KNEE Right 09/2005  . RIGHT/LEFT HEART CATH AND CORONARY ANGIOGRAPHY N/A 06/01/2018   Procedure: RIGHT/LEFT HEART CATH AND CORONARY ANGIOGRAPHY;  Surgeon: Corky Crafts, MD;  Location: [facility] INVASIVE CV LAB;  Service: Cardiovascular;  Laterality: N/A;  . SHOULDER OPEN ROTATOR CUFF REPAIR Right 07/1999  . TONSILLECTOMY  08/1974  . TOTAL  KNEE ARTHROPLASTY Left 08/07/2013   Procedure: TOTAL KNEE ARTHROPLASTY- LEFT;  Surgeon: Dannielle HuhSteve Lucey, MD;  Location: [facility] OR;  Service: Orthopedics;  Laterality: Left;  . TRANSTHORACIC ECHOCARDIOGRAM  2003   EF 55-65%; mild concentric LVH    Allergies as of 11/25/2018      Reactions   Clonidine Derivatives Swelling   [name] reports patient's tongue was swollen and patient  hallucinated   Fish Allergy Diarrhea, Swelling, Other (See Comments)   Turns skin "black," but can tolerate white fish Salmon- Diarrhea   Shellfish Allergy Hives   Doxycycline Rash   Indomethacin Other (See Comments)   Reaction not recalled by the patient   Lyrica [pregabalin] Other (See Comments)   Hallucinations   Methyldopa Other (See Comments)   Aldomet (for hypertension): Reaction not recalled by the patient   Morphine And Related Other (See Comments)   Family reports it drops her bp that she needs iv fluids   Orange Fruit [citrus] Other (See Comments)   Indigestion/heartburn   Strawberry (diagnostic) Itching   Cetirizine Hcl Itching, Rash   Codeine Itching   Levaquin [levofloxacin In D5w] Rash   Tomato Rash      Medication List       Accurate as of November 25, 2018  2:34 PM. Always use your most recent med list.        albuterol 108 (90 Base) MCG/ACT inhaler Commonly known as:  PROVENTIL HFA;VENTOLIN HFA Inhale 1-2 puffs into the lungs every 6 (six) hours as needed for wheezing or shortness of breath.   allopurinol 100 MG tablet Commonly known as:  ZYLOPRIM Take 1 tablet (100 mg total) by mouth at bedtime.   carvedilol 25 MG tablet Commonly known as:  COREG Take 25 mg by mouth 2 (two) times daily with a meal.   D3-1000 25 MCG (1000 UT) capsule Generic drug:  Cholecalciferol Take 1 capsule (1,000 Units total) by mouth daily.   diclofenac sodium 1 % Gel Commonly known as:  VOLTAREN Apply 2 g topically 4 (four) times daily as needed.   ENSURE Take 237 mLs by mouth every evening.   gabapentin 100 MG capsule Commonly known as:  NEURONTIN Take 1 capsule (100 mg total) by mouth at bedtime.   hydrALAZINE 100 MG tablet Commonly known as:  APRESOLINE Take 1 tablet (100 mg total) by mouth 3 (three) times daily.   HYDROcodone-acetaminophen 5-325 MG tablet Commonly known as:  NORCO/VICODIN Take 1 tablet by mouth every 8 (eight) hours.   hydroxychloroquine 200  MG tablet Commonly known as:  PLAQUENIL Take 1 tablet (200 mg total) by mouth 2 (two) times daily.   hydroxypropyl methylcellulose / hypromellose 2.5 % ophthalmic solution Commonly known as:  ISOPTO TEARS / GONIOVISC Place 1 drop into both eyes daily.   isosorbide mononitrate 30 MG 24 hr tablet Commonly known as:  IMDUR Take 1 tablet (30 mg total) by mouth at bedtime.   leflunomide 20 MG tablet Commonly known as:  ARAVA Take 1 tablet (20 mg total) by mouth daily.   Olopatadine HCl 0.2 % Soln Place 1 drop into both eyes daily.   predniSONE 10 MG tablet Commonly known as:  DELTASONE Take 1 tablet (10 mg total) by mouth daily.   rivaroxaban 20 MG Tabs tablet Commonly known as:  XARELTO Take 1 tablet (20 mg total) by mouth daily with supper.   sacubitril-valsartan 49-51 MG Commonly known as:  ENTRESTO Take 1 tablet by mouth daily at 12 noon.   triamcinolone cream  0.1 % Commonly known as:  KENALOG Apply 1 application topically 2 (two) times daily as needed (for itching- to affected sites).   vitamin C 500 MG tablet Commonly known as:  ASCORBIC ACID Take 1 tablet (500 mg total) by mouth daily.       No orders of the defined types were placed in this encounter.   Immunization History  Administered Date(s) Administered  . Influenza Whole 09/06/2012  . Influenza, High Dose Seasonal PF 09/13/2017  . Influenza,inj,Quad PF,6+ Mos 09/12/2013, 09/11/2014, 07/23/2015, 09/16/2016, 09/07/2018  . Pneumococcal Polysaccharide-23 04/13/2005, 11/09/2009, 09/12/2015  . Td 04/13/2005  . Zoster 11/10/2011    Social History   Tobacco Use  . Smoking status: Never Smoker  . Smokeless tobacco: Never Used  Substance Use Topics  . Alcohol use: No    Alcohol/week: 0.0 standard drinks    Family history is   Family History  Problem Relation Age of Onset  . Heart attack Father       Review of Systems  DATA OBTAINED: from patient, nurse-both as per history of present  illness GENERAL:  no fevers, fatigue, appetite changes SKIN: No itching, or rash EYES: No eye pain, redness, discharge EARS: No earache, tinnitus, change in hearing NOSE: No congestion, drainage or bleeding  MOUTH/THROAT: No mouth or tooth pain, No sore throat RESPIRATORY: No cough, wheezing, SOB CARDIAC: No chest pain, palpitations, lower extremity edema  GI: No abdominal pain, No N/V/D or constipation, No heartburn or reflux  GU: No dysuria, frequency or urgency, or incontinence  MUSCULOSKELETAL: No unrelieved bone/joint pain NEUROLOGIC: No headache, dizziness or focal weakness:+ Shooting pain in legs PSYCHIATRIC: No c/o anxiety or sadness   Vitals:   11/25/18 1420  BP: (!) 145/86  Pulse: 92  Resp: 20  Temp: 98.1 F (36.7 C)    SpO2 Readings from Last 1 Encounters:  11/17/18 99%   Body mass index is 29.7 kg/m.     Physical Exam  GENERAL APPEARANCE: Alert, conversant,  No acute distress.  SKIN: No diaphoresis rash HEAD: Normocephalic, atraumatic  EYES: Conjunctiva/lids clear. Pupils round, reactive. EOMs intact.  EARS: External exam WNL, canals clear. Hearing grossly normal.  NOSE: No deformity or discharge.  MOUTH/THROAT: Lips w/o lesions  RESPIRATORY: Breathing is even, unlabored. Lung sounds are clear   CARDIOVASCULAR: Heart RRR no murmurs, rubs or gallops. No peripheral edema.   GASTROINTESTINAL: Abdomen is soft, non-tender, not distended w/ normal bowel sounds. GENITOURINARY: Bladder non tender, not distended  MUSCULOSKELETAL: No abnormal joints or musculature NEUROLOGIC:  Cranial nerves 2-12 grossly intact. Moves all extremities  PSYCHIATRIC: Mood and affect appropriate to situation, no behavioral issues  Patient Active Problem List   Diagnosis Date Noted  . Spinal stenosis, lumbar 11/19/2018  . Acute encephalopathy 11/19/2018  . History of CVA (cerebrovascular accident) 11/19/2018  . Acute gout 11/15/2018  . Low back pain 11/14/2018  . Chronic back pain  10/27/2018  . Arthralgia of both knees 10/27/2018  . Rheumatoid arthritis involving both knees (HCC) 10/27/2018  . Acute pain 10/21/2018  . Physical deconditioning 09/06/2018  . Hyperlipidemia associated with type 2 diabetes mellitus (HCC), refuses statin 09/06/2018  . Refusal of statin medication by patient 09/04/2018  . Deep vein thrombosis (DVT) of distal vein of right lower extremity (HCC) 07/30/2018  . Hypertensive heart disease with heart failure (HCC) 07/30/2018  . Pulmonary embolism (HCC), 07/2018, on Xarelto with goal to DC after 6 months if more active 07/10/2018  . DCM (dilated cardiomyopathy) (HCC)   .  Benign hypertensive heart and kidney disease with CHF and stage 3 chronic kidney disease (HCC) 05/28/2018  . Osteoarthritis 05/06/2018  . Hypokalemia 02/02/2018  . Adhesive capsulitis of right shoulder 10/12/2017  . Idiopathic chronic gout of multiple sites with tophus 08/25/2017  . History of total knee arthroplasty, bilateral 02/18/2017  . History of rotator cuff surgery 02/18/2017  . High risk medications, long-term use 09/21/2016  . Chronic combined systolic and diastolic CHF (congestive heart failure) (HCC) 09/12/2015  . Bilateral edema of lower extremity 06/05/2014  . CKD stage 3 due to type 2 diabetes mellitus (HCC) 06/05/2014  . DM (diabetes mellitus), type 2 with renal complications (HCC) 06/05/2014    Class: Chronic  . Anemia of chronic disease 05/01/2014  . Rheumatoid arthritis involving multiple sites (HCC) 12/19/2013    Class: Chronic  . CAD (coronary artery disease) 10/18/2013  . Hypertension associated with diabetes (HCC) 08/08/2013    Class: Chronic  . Fibromyalgia 05/10/2013  . Type 2 diabetes mellitus with hyperlipidemia (HCC), patient declines statin 04/27/2007  . Allergic rhinitis 04/27/2007  . Diverticulosis 04/27/2007      Labs reviewed: Basic Metabolic Panel:    Component Value Date/Time   NA 136 (A) 11/24/2018   K 4.5 11/24/2018   CL 106  11/16/2018 0116   CO2 23 11/16/2018 0116   GLUCOSE 150 (H) 11/16/2018 0116   BUN 22 (A) 11/24/2018   CREATININE 0.9 11/24/2018   CREATININE 1.17 (H) 11/16/2018 0116   CREATININE 1.19 (H) 07/21/2017 1454   CALCIUM 8.6 (L) 11/16/2018 0116   PROT 5.4 (L) 11/15/2018 0508   PROT 7.2 09/09/2015 1210   ALBUMIN 2.8 (L) 11/15/2018 0508   ALBUMIN 3.9 09/09/2015 1210   AST 18 11/15/2018 0508   ALT 12 11/15/2018 0508   ALKPHOS 45 11/15/2018 0508   BILITOT 1.0 11/15/2018 0508   BILITOT 0.5 09/09/2015 1210   GFRNONAA 42 (L) 11/16/2018 0116   GFRNONAA 42 (L) 07/21/2017 1454   GFRAA 49 (L) 11/16/2018 0116   GFRAA 48 (L) 07/21/2017 1454    Recent Labs    06/01/18 0459  06/27/18 0630  09/05/18 2018  10/22/18 0344  11/15/18 0508 11/15/18 1830 11/16/18 0116 11/24/18  NA 141  140   < > 140   < >  --    < > 137   < > 135 136 136 136*  K 3.5  3.5   < > 3.5   < >  --    < > 3.6   < > 3.3* 4.1 4.1 4.5  CL 108  107   < > 104   < >  --    < > 104   < > 103 103 106  --   CO2 26  27   < > 26   < >  --    < > 24   < > 24 24 23   --   GLUCOSE 109*  110*   < > 108*   < >  --    < > 177*   < > 80 166* 150*  --   BUN 22  21   < > 17   < >  --    < > 17   < > 12 16 22  22*  CREATININE 1.00  0.98   < > 1.10*   < >  --    < > 0.86   < > 1.01* 1.16* 1.17* 0.9  CALCIUM 8.9  8.9   < >  8.7*   < >  --    < > 8.6*   < > 8.4* 8.6* 8.6*  --   MG  --   --   --   --  1.8  --  1.9  --  1.6*  --   --   --   PHOS 2.7  --  2.7  --   --   --   --   --   --   --   --   --    < > = values in this interval not displayed.   Liver Function Tests: Recent Labs    08/29/18 1345 10/21/18 0954 11/15/18 0508  AST 17 20 18   ALT 10 11 12   ALKPHOS 47 41 45  BILITOT 0.3 1.0 1.0  PROT 6.7 5.9* 5.4*  ALBUMIN 3.7 3.3* 2.8*   Recent Labs    06/22/18 2153 06/23/18 0500 07/23/18 2100  LIPASE 150* 77* 50   Recent Labs    11/15/18 1830  AMMONIA 10   CBC: Recent Labs    10/21/18 0954  11/14/18 1635 11/15/18 0508  11/15/18 1830 11/16/18 0116 11/24/18  WBC 7.7   < > 6.4 10.5 10.0 11.9* 8.0  NEUTROABS 4.7  --  4.1 7.4  --   --   --   HGB 9.8*   < > 10.1* 10.2* 10.3* 9.6* 8.9*  HCT 31.4*   < > 31.6* 31.3* 32.3* 29.7* 26*  MCV 99.4   < > 98.8 97.8 95.6 94.3  --   PLT 247   < > 272 238 226 233 320   < > = values in this interval not displayed.   Lipid Recent Labs    05/29/18 0030 06/24/18 0524 06/25/18 0607  CHOL 178 189 202*  HDL 73 63 63  LDLCALC 92 113* 126*  TRIG 64 65 67    Cardiac Enzymes: Recent Labs    09/05/18 2018 09/06/18 0131 09/06/18 0716  TROPONINI 0.04* 0.03* 0.03*   BNP: Recent Labs    05/28/18 1614 06/22/18 2153 07/23/18 2100  BNP 2,077.6* 1,146.7* 138.3*   No results found for: [facility] Lab Results  Component Value Date   HGBA1C 5.9 (H) 06/25/2018   Lab Results  Component Value Date   TSH 1.095 11/15/2018   Lab Results  Component Value Date   VITAMINB12 419 10/21/2018   Lab Results  Component Value Date   FOLATE >19.9 07/03/2014   Lab Results  Component Value Date   IRON 17 (L) 10/21/2018   TIBC 233 (L) 10/21/2018   FERRITIN 215 10/21/2018    Imaging and Procedures obtained prior to SNF admission: Mr Lumbar Spine Wo Contrast  Result Date: 11/15/2018 CLINICAL DATA:  Initial evaluation for acute rapidly progressive back pain. EXAM: MRI LUMBAR SPINE WITHOUT CONTRAST TECHNIQUE: Multiplanar, multisequence MR imaging of the lumbar spine was performed. No intravenous contrast was administered. COMPARISON:  Recent MRI from 10/21/2018 FINDINGS: Segmentation: Transitional lumbosacral anatomy with nearly fully formed S1-2 interspace. Same numbering system employed as on previous exam. Alignment: Mild levoconvex scoliosis. Chronic grade 1 anterolisthesis of L4 on L5 and L5 on S1, stable. Trace retrolisthesis of L2 on L3 also unchanged. Vertebrae: Chronic height loss with associated Schmorl's node at the superior endplate of L1, stable. Vertebral body heights  otherwise maintained without evidence for acute or interval fracture. Bone marrow signal intensity within normal limits. No discrete or worrisome osseous lesions. No abnormal marrow edema. Reactive endplate changes at the superior endplate of  L1, stable. Conus medullaris and cauda equina: Conus extends to the L2 level. Conus and cauda equina appear normal. Paraspinal and other soft tissues: Paraspinous soft tissues demonstrate no acute finding. Chronic fatty atrophy noted within the posterior paraspinous musculature. Visualized visceral structures within normal limits. Disc levels: T12-L1: Seen only on sagittal projection. Trace anterolisthesis. Mild diffuse disc bulge with disc desiccation. Moderate bilateral facet hypertrophy, right greater than left. No spinal stenosis. Mild left with moderate right foraminal narrowing. Appearance is stable. L1-2: Minimal annular disc bulge. Mild to moderate facet hypertrophy. No stenosis. L2-3: Retrolisthesis. Mild circumferential disc bulge with disc desiccation and intervertebral disc space narrowing. Moderate facet and ligament flavum hypertrophy. Resultant mild spinal stenosis. Mild left with moderate right L2 foraminal narrowing. Appearance unchanged. L3-4: Diffuse disc bulge with disc desiccation and intervertebral disc space narrowing. Disc bulging greatest within the extraforaminal regions. Associated mild osteophytic endplate spurring. Moderate facet and ligament flavum hypertrophy. Resultant mild spinal stenosis with mild right lateral recess narrowing, stable. Moderate right with mild left L3 foraminal narrowing, unchanged. L4-5: Chronic anterolisthesis. Associated degenerative intervertebral disc space narrowing with disc desiccation. Associated broad posterior pseudo disc bulge, asymmetric to the right. Severe facet arthrosis, left slightly worse than right. Ligamentum flavum hypertrophy. Resultant severe canal with left greater than right lateral recess stenosis.  Moderate to severe bilateral L4 foraminal narrowing, stable. L5-S1: Chronic anterolisthesis. Associated disc desiccation with intervertebral disc space narrowing with broad posterior disc bulge/pseudo disc bulge, slightly asymmetric to the right. Severe facet arthrosis with ligamentum flavum hypertrophy. Resultant severe spinal stenosis with bilateral recess narrowing. Moderate to severe bilateral L5 foraminal stenosis, stable. S1-2: Fully formed disc space. Tiny central disc protrusion minimally indents the ventral thecal sac. No significant stenosis. IMPRESSION: 1. Stable appearance of the lumbar spine as compared to recent MRI from 10/21/2018. No acute abnormality. 2. Severe multifactorial spinal stenosis at L4-5 and L5-S1 with severe lateral recess narrowing. Moderate to severe bilateral L4 and L5 foraminal narrowing at these levels as above. 3. More mild spinal stenosis at L2-3 and L3-4 with moderate right-sided foraminal narrowing. 4. Chronic L1 compression fracture, stable. Electronically Signed   By: Rise Mu [name]   On: 11/15/2018 06:31   Dg Chest Port 1 View  Result Date: 11/15/2018 CLINICAL DATA:  History of fever, history of rheumatoid arthritis and limited mobility, increasing drowsiness today, some difficulty swallowing and speaking EXAM: PORTABLE CHEST 1 VIEW COMPARISON:  Chest x-ray of 07/23/2018 and CT chest of 09/05/2017 FINDINGS: The patient did not take a deep inspiration, but no pneumonia or effusion is currently seen. Mediastinal and hilar contours are stable with ectasia of the descending thoracic aorta. Moderate cardiomegaly is stable. There are degenerative changes in both shoulders. IMPRESSION: Poor inspiration but no active lung disease. Stable moderate cardiomegaly. Electronically Signed   By: Dwyane Dee [name]   On: 11/15/2018 16:52     Not all labs, radiology exams or other studies done during hospitalization come through on my EPIC note; however they are reviewed by  me.    Assessment and Plan  Neuropathic pain bilateral lower extremities-patient is on Neurontin 100 mg nightly; increasing this to Neurontin 200 mg twice daily and nightly    Michille Mcelrath D. Lyn Hollingshead, MD

## 2018-11-25 NOTE — Telephone Encounter (Signed)
patient daughter is calling and states that the rehab center has called and states the patient is in really bad pain in both of her legs. She states she contacted the Rheumatologist and they informed her to contact Dr. Earlene Plater office to see if she is needing to go back to the ER, if her medication needs to be increased or what exactly needs to be done. Please advise.   Call to daughter: ( Mother is at Rehab center and she has been contacted about her mother's pain) Patient is in a lot of pain- knees and legs. Patient had trouble sleeping last night. She is taking the pain medication without relief-Oxycodone. Rheumatologist was called and advised to ask Dr Earlene Plater about adding Gabapentin to see if that may help with discomfort. Daughter is on her way out to see her now- she will report any changes she sees.

## 2018-11-25 NOTE — Telephone Encounter (Signed)
Completed and given to front staff. 11/25/2018  

## 2018-11-25 NOTE — Telephone Encounter (Signed)
See note

## 2018-11-25 NOTE — Telephone Encounter (Signed)
Spoke to pt's daughter Para March, asked her if she was with her mother yet. Para March said no, the facility called her and said the in house doctor is going to see her. Told her okay that is fine please call back and give [place] update on patient. Jeanette verbalized understanding.

## 2018-11-26 ENCOUNTER — Encounter: Payer: Self-pay | Admitting: Internal Medicine

## 2018-11-29 ENCOUNTER — Non-Acute Institutional Stay (SKILLED_NURSING_FACILITY): Payer: Medicare Other | Admitting: Internal Medicine

## 2018-11-29 ENCOUNTER — Encounter: Payer: Self-pay | Admitting: Internal Medicine

## 2018-11-29 DIAGNOSIS — R600 Localized edema: Secondary | ICD-10-CM

## 2018-11-29 DIAGNOSIS — G5793 Unspecified mononeuropathy of bilateral lower limbs: Secondary | ICD-10-CM

## 2018-11-29 NOTE — Progress Notes (Signed)
:  Location:  [facility] Room Number: [phone] Place of Service:  SNF (31)  Randon Goldsmith. Lyn Hollingshead, MD  Patient Care Team: Helane Rima, DO as PCP - General (Family Medicine) Chilton Si, MD as PCP - Cardiology (Cardiology) Arlean Hopping, MD as Rounding Team (Internal Medicine)  Extended Emergency Contact Information Primary Emergency Contact: Hampton,Jeannette Address: [address]          [place], [place] [address] D[place] of [place] Home Phone: [phone] Relation: Daughter Secondary Emergency Contact: [name] Address: [address]          [place], [place] [address] [place] of [place] Mobile Phone: [phone] Relation: Daughter     Allergies: Clonidine derivatives; Fish allergy; Shellfish allergy; Doxycycline; Indomethacin; Lyrica [pregabalin]; Methyldopa; Morphine and related; Orange fruit [citrus]; Strawberry (diagnostic); Cetirizine hcl; Codeine; Levaquin [levofloxacin in d5w]; and Tomato  Chief Complaint  Patient presents with  . Acute Visit    HPI: Patient is 85 y.o. female who is being seen for pedal edema, and for leg pain.  Regarding her swelling of her legs patient says that she was on a diuretic before and that she needs it now.  Patient denies any chest pain or shortness of breath.  Patient also complains of leg pain at night that radiates down her leg.  Patient has been treated for this pain before.  Past Medical History:  Diagnosis Date  . Allergic rhinitis due to pollen 04/27/2007  . Arthritis    "in q joint" (08/07/2013)  . CHF (congestive heart failure) (HCC)   . Coronary atherosclerosis of native coronary artery   . Cough   . Disorder of bone and cartilage, unspecified   . Edema 05/03/2013  . Exertional shortness of breath    "sometimes" (08/07/2013)  . First degree atrioventricular block   . Gout, unspecified 04/19/2013  . Hyperlipidemia 04/27/2007  . Hypertension   . Midline low  back pain without sciatica 06/27/2014  . Muscle spasm   . Muscle weakness (generalized)   . Myocardial infarction (HCC) ~ 1970  . Onychia and paronychia of toe   . Osteoarthrosis, unspecified whether generalized or localized, unspecified site 04/27/2007  . Other fall   . Other malaise and fatigue   . Pneumonia 05/2018  . Prepatellar bursitis   . Pulmonary embolism (HCC) 07/2018  . Rheumatic nodule 06/28/2017  . Seizures (HCC)   . Stroke (HCC) 01/17/2014  . TIA (transient ischemic attack)    "a few one summer" (08/07/2013)  . Type II diabetes mellitus (HCC)    "fasting 90-110s" (08/07/2013)  . Unspecified essential hypertension 08/08/2013  . Unspecified vitamin D deficiency     Past Surgical History:  Procedure Laterality Date  . ABDOMINAL HYSTERECTOMY  04/1980  . APPENDECTOMY  1960  . CARDIAC CATHETERIZATION  2003  . CATARACT EXTRACTION W/ INTRAOCULAR LENS  IMPLANT, BILATERAL Bilateral ~ 2012  . CHOLECYSTECTOMY N/A 06/26/2018   Procedure: LAPAROSCOPIC CHOLECYSTECTOMY POSSIBLE INTRAOPERATIVE CHOLANGIOGRAM;  Surgeon: Abigail Miyamoto, MD;  Location: [facility] OR;  Service: General;  Laterality: N/A;  . JOINT REPLACEMENT    . REPLACEMENT TOTAL KNEE Right 09/2005  . RIGHT/LEFT HEART CATH AND CORONARY ANGIOGRAPHY N/A 06/01/2018   Procedure: RIGHT/LEFT HEART CATH AND CORONARY ANGIOGRAPHY;  Surgeon: Corky Crafts, MD;  Location: [facility] INVASIVE CV LAB;  Service: Cardiovascular;  Laterality: N/A;  . SHOULDER OPEN ROTATOR CUFF REPAIR Right 07/1999  . TONSILLECTOMY  08/1974  . TOTAL KNEE ARTHROPLASTY Left 08/07/2013   Procedure: TOTAL  KNEE ARTHROPLASTY- LEFT;  Surgeon: Dannielle Huh, MD;  Location: [facility] OR;  Service: Orthopedics;  Laterality: Left;  . TRANSTHORACIC ECHOCARDIOGRAM  2003   EF 55-65%; mild concentric LVH    Allergies as of 11/29/2018      Reactions   Clonidine Derivatives Swelling   [name]   Fish Allergy  Diarrhea, Swelling, Other (See Comments)   Turns skin "black," but can tolerate white fish Salmon- Diarrhea   Shellfish Allergy Hives   Doxycycline Rash   Indomethacin Other (See Comments)   Reaction not recalled by the patient   Lyrica [pregabalin] Other (See Comments)   Hallucinations   Methyldopa Other (See Comments)   Aldomet (for hypertension): Reaction not recalled by the patient   Morphine And Related Other (See Comments)   Family reports it drops her bp that she needs iv fluids   Orange Fruit [citrus] Other (See Comments)   Indigestion/heartburn   Strawberry (diagnostic) Itching   Cetirizine Hcl Itching, Rash   Codeine Itching   Levaquin [levofloxacin In D5w] Rash   Tomato Rash      Medication List       Accurate as of November 29, 2018  3:41 PM. Always use your most recent med list.        albuterol 108 (90 Base) MCG/ACT inhaler Commonly known as:  PROVENTIL HFA;VENTOLIN HFA Inhale 1-2 puffs into the lungs every 6 (six) hours as needed for wheezing or shortness of breath.   allopurinol 100 MG tablet Commonly known as:  ZYLOPRIM Take 1 tablet (100 mg total) by mouth at bedtime.   carvedilol 25 MG tablet Commonly known as:  COREG Take 25 mg by mouth 2 (two) times daily with a meal.   D3-1000 25 MCG (1000 UT) capsule Generic drug:  Cholecalciferol Take 1 capsule (1,000 Units total) by mouth daily.   diclofenac sodium 1 % Gel Commonly known as:  VOLTAREN Apply 2 g topically 4 (four) times daily as needed.   ENSURE Take 237 mLs by mouth every evening.   gabapentin 100 MG capsule Commonly known as:  NEURONTIN Take 1 capsule (100 mg total) by mouth at bedtime.   hydrALAZINE 100 MG tablet Commonly known as:  APRESOLINE Take 1 tablet (100 mg total) by mouth 3 (three) times daily.   HYDROcodone-acetaminophen 5-325 MG tablet Commonly known as:  NORCO/VICODIN Take 1 tablet by mouth every 8 (eight) hours.   hydroxychloroquine 200 MG tablet Commonly known  as:  PLAQUENIL Take 1 tablet (200 mg total) by mouth 2 (two) times daily.   hydroxypropyl methylcellulose / hypromellose 2.5 % ophthalmic solution Commonly known as:  ISOPTO TEARS / GONIOVISC Place 1 drop into both eyes daily.   isosorbide mononitrate 30 MG 24 hr tablet Commonly known as:  IMDUR Take 1 tablet (30 mg total) by mouth at bedtime.   leflunomide 20 MG tablet Commonly known as:  ARAVA Take 1 tablet (20 mg total) by mouth daily.   Olopatadine HCl 0.2 % Soln Place 1 drop into both eyes daily.   predniSONE 10 MG tablet Commonly known as:  DELTASONE Take 1 tablet (10 mg total) by mouth daily.   rivaroxaban 20 MG Tabs tablet Commonly known as:  XARELTO Take 1 tablet (20 mg total) by mouth daily with supper.   sacubitril-valsartan 49-51 MG Commonly known as:  ENTRESTO Take 1 tablet by mouth daily at 12 noon.   triamcinolone cream 0.1 % Commonly known as:  KENALOG Apply  1 application topically 2 (two) times daily as needed (for itching- to affected sites).   vitamin C 500 MG tablet Commonly known as:  ASCORBIC ACID Take 1 tablet (500 mg total) by mouth daily.       No orders of the defined types were placed in this encounter.   Immunization History  Administered Date(s) Administered  . Influenza Whole 09/06/2012  . Influenza, High Dose Seasonal PF 09/13/2017  . Influenza,inj,Quad PF,6+ Mos 09/12/2013, 09/11/2014, 07/23/2015, 09/16/2016, 09/07/2018  . Pneumococcal Polysaccharide-23 04/13/2005, 11/09/2009, 09/12/2015  . Td 04/13/2005  . Zoster 11/10/2011    Social History   Tobacco Use  . Smoking status: Never Smoker  . Smokeless tobacco: Never Used  Substance Use Topics  . Alcohol use: No    Alcohol/week: 0.0 standard drinks    Family history is   Family History  Problem Relation Age of Onset  . Heart attack Father       Review of Systems  DATA OBTAINED: from patient- as per history of present illness GENERAL:  no fevers, fatigue, appetite  changes SKIN: No itching, or rash EYES: No eye pain, redness, discharge EARS: No earache, tinnitus, change in hearing NOSE: No congestion, drainage or bleeding  MOUTH/THROAT: No mouth or tooth pain, No sore throat RESPIRATORY: No cough, wheezing, SOB CARDIAC: No chest pain, palpitations, +lower extremity edema  GI: No abdominal pain, No N/V/D or constipation, No heartburn or reflux  GU: No dysuria, frequency or urgency, or incontinence  MUSCULOSKELETAL: No unrelieved bone/joint pain NEUROLOGIC: No headache, dizziness or focal weakness; + leg pain PSYCHIATRIC: No c/o anxiety or sadness   Vitals:   11/29/18 1535  BP: 123/75  Pulse: 95  Resp: 18  Temp: 98 F (36.7 C)    SpO2 Readings from Last 1 Encounters:  11/17/18 99%   Body mass index is 29.7 kg/m.     Physical Exam  GENERAL APPEARANCE: Alert, conversant,  No acute distress.  SKIN: No diaphoresis rash HEAD: Normocephalic, atraumatic  EYES: Conjunctiva/lids clear. Pupils round, reactive. EOMs intact.  EARS: External exam WNL, canals clear. Hearing grossly normal.  NOSE: No deformity or discharge.  MOUTH/THROAT: Lips w/o lesions  RESPIRATORY: Breathing is even, unlabored. Lung sounds are clear   CARDIOVASCULAR: Heart RRR no murmurs, rubs or gallops. +1 bilateral lower peripheral edema.   GASTROINTESTINAL: Abdomen is soft, non-tender, not distended w/ normal bowel sounds. GENITOURINARY: Bladder non tender, not distended  MUSCULOSKELETAL: No abnormal joints or musculature NEUROLOGIC:  Cranial nerves 2-12 grossly intact. Moves all extremities  PSYCHIATRIC: Mood and affect appropriate to situation, no behavioral issues  Patient Active Problem List   Diagnosis Date Noted  . Spinal stenosis, lumbar 11/19/2018  . Acute encephalopathy 11/19/2018  . History of CVA (cerebrovascular accident) 11/19/2018  . Acute gout 11/15/2018  . Low back pain 11/14/2018  . Chronic back pain 10/27/2018  . Arthralgia of both knees  10/27/2018  . Rheumatoid arthritis involving both knees (HCC) 10/27/2018  . Acute pain 10/21/2018  . Physical deconditioning 09/06/2018  . Hyperlipidemia associated with type 2 diabetes mellitus (HCC), refuses statin 09/06/2018  . Refusal of statin medication by patient 09/04/2018  . Deep vein thrombosis (DVT) of distal vein of right lower extremity (HCC) 07/30/2018  . Hypertensive heart disease with heart failure (HCC) 07/30/2018  . Pulmonary embolism (HCC), 07/2018, on Xarelto with goal to DC after 6 months if more active 07/10/2018  . DCM (dilated cardiomyopathy) (HCC)   . Benign hypertensive heart and kidney disease with CHF and  stage 3 chronic kidney disease (HCC) 05/28/2018  . Osteoarthritis 05/06/2018  . Hypokalemia 02/02/2018  . Adhesive capsulitis of right shoulder 10/12/2017  . Idiopathic chronic gout of multiple sites with tophus 08/25/2017  . History of total knee arthroplasty, bilateral 02/18/2017  . History of rotator cuff surgery 02/18/2017  . High risk medications, long-term use 09/21/2016  . Chronic combined systolic and diastolic CHF (congestive heart failure) (HCC) 09/12/2015  . Bilateral edema of lower extremity 06/05/2014  . CKD stage 3 due to type 2 diabetes mellitus (HCC) 06/05/2014  . DM (diabetes mellitus), type 2 with renal complications (HCC) 06/05/2014    Class: Chronic  . Anemia of chronic disease 05/01/2014  . Rheumatoid arthritis involving multiple sites (HCC) 12/19/2013    Class: Chronic  . CAD (coronary artery disease) 10/18/2013  . Hypertension associated with diabetes (HCC) 08/08/2013    Class: Chronic  . Fibromyalgia 05/10/2013  . Type 2 diabetes mellitus with hyperlipidemia (HCC), patient declines statin 04/27/2007  . Allergic rhinitis 04/27/2007  . Diverticulosis 04/27/2007      Labs reviewed: Basic Metabolic Panel:    Component Value Date/Time   NA 136 (A) 11/24/2018   K 4.5 11/24/2018   CL 106 11/16/2018 0116   CO2 23 11/16/2018 0116     GLUCOSE 150 (H) 11/16/2018 0116   BUN 22 (A) 11/24/2018   CREATININE 0.9 11/24/2018   CREATININE 1.17 (H) 11/16/2018 0116   CREATININE 1.19 (H) 07/21/2017 1454   CALCIUM 8.6 (L) 11/16/2018 0116   PROT 5.4 (L) 11/15/2018 0508   PROT 7.2 09/09/2015 1210   ALBUMIN 2.8 (L) 11/15/2018 0508   ALBUMIN 3.9 09/09/2015 1210   AST 18 11/15/2018 0508   ALT 12 11/15/2018 0508   ALKPHOS 45 11/15/2018 0508   BILITOT 1.0 11/15/2018 0508   BILITOT 0.5 09/09/2015 1210   GFRNONAA 42 (L) 11/16/2018 0116   GFRNONAA 42 (L) 07/21/2017 1454   GFRAA 49 (L) 11/16/2018 0116   GFRAA 48 (L) 07/21/2017 1454    Recent Labs    06/01/18 0459  06/27/18 0630  09/05/18 2018  10/22/18 0344  11/15/18 0508 11/15/18 1830 11/16/18 0116 11/24/18  NA 141  140   < > 140   < >  --    < > 137   < > 135 136 136 136*  K 3.5  3.5   < > 3.5   < >  --    < > 3.6   < > 3.3* 4.1 4.1 4.5  CL 108  107   < > 104   < >  --    < > 104   < > 103 103 106  --   CO2 26  27   < > 26   < >  --    < > 24   < > 24 24 23   --   GLUCOSE 109*  110*   < > 108*   < >  --    < > 177*   < > 80 166* 150*  --   BUN 22  21   < > 17   < >  --    < > 17   < > 12 16 22  22*  CREATININE 1.00  0.98   < > 1.10*   < >  --    < > 0.86   < > 1.01* 1.16* 1.17* 0.9  CALCIUM 8.9  8.9   < > 8.7*   < >  --    < >  8.6*   < > 8.4* 8.6* 8.6*  --   MG  --   --   --   --  1.8  --  1.9  --  1.6*  --   --   --   PHOS 2.7  --  2.7  --   --   --   --   --   --   --   --   --    < > = values in this interval not displayed.   Liver Function Tests: Recent Labs    08/29/18 1345 10/21/18 0954 11/15/18 0508  AST 17 20 18   ALT 10 11 12   ALKPHOS 47 41 45  BILITOT 0.3 1.0 1.0  PROT 6.7 5.9* 5.4*  ALBUMIN 3.7 3.3* 2.8*   Recent Labs    06/22/18 2153 06/23/18 0500 07/23/18 2100  LIPASE 150* 77* 50   Recent Labs    11/15/18 1830  AMMONIA 10   CBC: Recent Labs    10/21/18 0954  11/14/18 1635 11/15/18 0508 11/15/18 1830 11/16/18 0116 11/24/18   WBC 7.7   < > 6.4 10.5 10.0 11.9* 8.0  NEUTROABS 4.7  --  4.1 7.4  --   --   --   HGB 9.8*   < > 10.1* 10.2* 10.3* 9.6* 8.9*  HCT 31.4*   < > 31.6* 31.3* 32.3* 29.7* 26*  MCV 99.4   < > 98.8 97.8 95.6 94.3  --   PLT 247   < > 272 238 226 233 320   < > = values in this interval not displayed.   Lipid Recent Labs    05/29/18 0030 06/24/18 0524 06/25/18 0607  CHOL 178 189 202*  HDL 73 63 63  LDLCALC 92 113* 126*  TRIG 64 65 67    Cardiac Enzymes: Recent Labs    09/05/18 2018 09/06/18 0131 09/06/18 0716  TROPONINI 0.04* 0.03* 0.03*   BNP: Recent Labs    05/28/18 1614 06/22/18 2153 07/23/18 2100  BNP 2,077.6* 1,146.7* 138.3*   No results found for: [facility] Lab Results  Component Value Date   HGBA1C 5.9 (H) 06/25/2018   Lab Results  Component Value Date   TSH 1.095 11/15/2018   Lab Results  Component Value Date   VITAMINB12 419 10/21/2018   Lab Results  Component Value Date   FOLATE >19.9 07/03/2014   Lab Results  Component Value Date   IRON 17 (L) 10/21/2018   TIBC 233 (L) 10/21/2018   FERRITIN 215 10/21/2018    Imaging and Procedures obtained prior to SNF admission: Mr Lumbar Spine Wo Contrast  Result Date: 11/15/2018 CLINICAL DATA:  Initial evaluation for acute rapidly progressive back pain. EXAM: MRI LUMBAR SPINE WITHOUT CONTRAST TECHNIQUE: Multiplanar, multisequence MR imaging of the lumbar spine was performed. No intravenous contrast was administered. COMPARISON:  Recent MRI from 10/21/2018 FINDINGS: Segmentation: Transitional lumbosacral anatomy with nearly fully formed S1-2 interspace. Same numbering system employed as on previous exam. Alignment: Mild levoconvex scoliosis. Chronic grade 1 anterolisthesis of L4 on L5 and L5 on S1, stable. Trace retrolisthesis of L2 on L3 also unchanged. Vertebrae: Chronic height loss with associated Schmorl's node at the superior endplate of L1, stable. Vertebral body heights otherwise maintained without evidence  for acute or interval fracture. Bone marrow signal intensity within normal limits. No discrete or worrisome osseous lesions. No abnormal marrow edema. Reactive endplate changes at the superior endplate of L1, stable. Conus medullaris and cauda equina: Conus extends to the L2  level. Conus and cauda equina appear normal. Paraspinal and other soft tissues: Paraspinous soft tissues demonstrate no acute finding. Chronic fatty atrophy noted within the posterior paraspinous musculature. Visualized visceral structures within normal limits. Disc levels: T12-L1: Seen only on sagittal projection. Trace anterolisthesis. Mild diffuse disc bulge with disc desiccation. Moderate bilateral facet hypertrophy, right greater than left. No spinal stenosis. Mild left with moderate right foraminal narrowing. Appearance is stable. L1-2: Minimal annular disc bulge. Mild to moderate facet hypertrophy. No stenosis. L2-3: Retrolisthesis. Mild circumferential disc bulge with disc desiccation and intervertebral disc space narrowing. Moderate facet and ligament flavum hypertrophy. Resultant mild spinal stenosis. Mild left with moderate right L2 foraminal narrowing. Appearance unchanged. L3-4: Diffuse disc bulge with disc desiccation and intervertebral disc space narrowing. Disc bulging greatest within the extraforaminal regions. Associated mild osteophytic endplate spurring. Moderate facet and ligament flavum hypertrophy. Resultant mild spinal stenosis with mild right lateral recess narrowing, stable. Moderate right with mild left L3 foraminal narrowing, unchanged. L4-5: Chronic anterolisthesis. Associated degenerative intervertebral disc space narrowing with disc desiccation. Associated broad posterior pseudo disc bulge, asymmetric to the right. Severe facet arthrosis, left slightly worse than right. Ligamentum flavum hypertrophy. Resultant severe canal with left greater than right lateral recess stenosis. Moderate to severe bilateral L4  foraminal narrowing, stable. L5-S1: Chronic anterolisthesis. Associated disc desiccation with intervertebral disc space narrowing with broad posterior disc bulge/pseudo disc bulge, slightly asymmetric to the right. Severe facet arthrosis with ligamentum flavum hypertrophy. Resultant severe spinal stenosis with bilateral recess narrowing. Moderate to severe bilateral L5 foraminal stenosis, stable. S1-2: Fully formed disc space. Tiny central disc protrusion minimally indents the ventral thecal sac. No significant stenosis. IMPRESSION: 1. Stable appearance of the lumbar spine as compared to recent MRI from 10/21/2018. No acute abnormality. 2. Severe multifactorial spinal stenosis at L4-5 and L5-S1 with severe lateral recess narrowing. Moderate to severe bilateral L4 and L5 foraminal narrowing at these levels as above. 3. More mild spinal stenosis at L2-3 and L3-4 with moderate right-sided foraminal narrowing. 4. Chronic L1 compression fracture, stable. Electronically Signed   By: Rise Mu [name]   On: 11/15/2018 06:31   Dg Chest Port 1 View  Result Date: 11/15/2018 CLINICAL DATA:  History of fever, history of rheumatoid arthritis and limited mobility, increasing drowsiness today, some difficulty swallowing and speaking EXAM: PORTABLE CHEST 1 VIEW COMPARISON:  Chest x-ray of 07/23/2018 and CT chest of 09/05/2017 FINDINGS: The patient did not take a deep inspiration, but no pneumonia or effusion is currently seen. Mediastinal and hilar contours are stable with ectasia of the descending thoracic aorta. Moderate cardiomegaly is stable. There are degenerative changes in both shoulders. IMPRESSION: Poor inspiration but no active lung disease. Stable moderate cardiomegaly. Electronically Signed   By: Dwyane Dee [name]   On: 11/15/2018 16:52     Not all labs, radiology exams or other studies done during hospitalization come through on my EPIC note; however they are reviewed by me.    Assessment and  Plan  Bilateral lower extremity edema- patient was on Lasix 20 mg as needed prior; will restart Lasix 20 mg for 3 days then back to as needed  Neuropathic leg pain- patient is already on Neurontin 200 mg nightly; increase to 400 mg nightly and monitor response    Akeel Reffner D. Lyn Hollingshead, MD

## 2018-11-30 ENCOUNTER — Encounter: Payer: Self-pay | Admitting: Internal Medicine

## 2018-12-01 ENCOUNTER — Other Ambulatory Visit: Payer: Self-pay | Admitting: Internal Medicine

## 2018-12-01 MED ORDER — HYDROCODONE-ACETAMINOPHEN 5-325 MG PO TABS: 1.0000 | ORAL_TABLET | Freq: Four times a day (QID) | ORAL | 0 refills | Status: DC

## 2018-12-02 ENCOUNTER — Encounter: Payer: Self-pay | Admitting: Internal Medicine

## 2018-12-02 ENCOUNTER — Telehealth: Payer: Self-pay | Admitting: Cardiovascular Disease

## 2018-12-02 DIAGNOSIS — M130 Polyarthritis, unspecified: Secondary | ICD-10-CM | POA: Insufficient documentation

## 2018-12-02 DIAGNOSIS — M7989 Other specified soft tissue disorders: Secondary | ICD-10-CM | POA: Insufficient documentation

## 2018-12-02 DIAGNOSIS — M79643 Pain in unspecified hand: Secondary | ICD-10-CM | POA: Insufficient documentation

## 2018-12-02 DIAGNOSIS — M25539 Pain in unspecified wrist: Secondary | ICD-10-CM | POA: Insufficient documentation

## 2018-12-02 NOTE — Progress Notes (Signed)
Opened in error

## 2018-12-02 NOTE — Telephone Encounter (Signed)
Called patient, LVM to call back to discuss swelling issues.

## 2018-12-02 NOTE — Telephone Encounter (Signed)
Spoke with patients daughter regarding swelling mother has been having and not getting daily weights. Lasix was d/c when patient left the hospital where she was d/c to facility. Daughter stated no difference in breathing and has been more sedentary. After reviewing chart explained to the daughter that Furosemide was ordered daily for 3 days daily and then PRN on  11/29/18. Daughter is planning for mother to leave facility on Monday, she will be able to monitor her weights and foods she is eating.  Advised daughter of facility physicians recommendations, she was unaware. Patient has f/u on 12/15/18 with Eda Paschal PA. Advised daughter to see how mother does with added Furosemide, keep follow up, and to call back if any concerns prior to follow up. Daughter agreeable to plan.

## 2018-12-02 NOTE — Telephone Encounter (Addendum)
° °  [name] calling to report swelling. Patient being discharged from rehab today, states her mother does not look well. Concerned about swelling and not being on Lasix   Pt c/o swelling: STAT is pt has developed SOB within 24 hours  1) How much weight have you gained and in what time span? N/A  2) If swelling, where is the swelling located? LEGS  3) Are you currently taking a fluid pill? NO  4) Are you currently SOB? NO  5) Do you have a log of your daily weights (if so, list)? N/A  6) Have you gained 3 pounds in a day or 5 pounds in a week? N/A  7) Have you traveled recently? NO

## 2018-12-05 ENCOUNTER — Encounter: Payer: Self-pay | Admitting: Internal Medicine

## 2018-12-05 ENCOUNTER — Non-Acute Institutional Stay (SKILLED_NURSING_FACILITY): Payer: Medicare Other | Admitting: Internal Medicine

## 2018-12-05 ENCOUNTER — Inpatient Hospital Stay: Payer: Medicare Other | Admitting: Family Medicine

## 2018-12-05 DIAGNOSIS — R609 Edema, unspecified: Secondary | ICD-10-CM | POA: Diagnosis not present

## 2018-12-05 DIAGNOSIS — R635 Abnormal weight gain: Secondary | ICD-10-CM

## 2018-12-05 NOTE — Progress Notes (Signed)
:  [facility] Room Number: [phone] Place of Service:  SNF (31)  [name] Goldsmithnne D. [name] HollingsheadAlexander, [name]  Patient Care Team: [name] RimaWallace, Erica, DO as PCP - General (Family Medicine) [name] Siandolph, Tiffany, [name] as PCP - Cardiology (Cardiology) [name] [facility]osp, Wladmits, [name] as Rounding Team (Internal Medicine)  Extended Emergency Contact Information Primary Emergency Contact: [name] [name] Address: [address]          [place], [place] [address] [place] Home Phone: [phone] Relation: Daughter Secondary Emergency Contact: [name] BlightSurgeon,Patricia Address: [address]          [place], [place] [address] [place] Mobile Phone: [phone] Relation: Daughter     Allergies: Clonidine derivatives; Fish allergy; Shellfish allergy; Doxycycline; Indomethacin; Lyrica [pregabalin]; Methyldopa; Morphine and related; Orange fruit [citrus]; Strawberry (diagnostic); Cetirizine hcl; Codeine; Levaquin [levofloxacin in d5w]; and Tomato  Chief Complaint  Patient presents with  . Acute Visit    HPI: Patient is 85 y.o. female who is being seen for weight gain.  Patient was seen last week for weight gain of 3 pounds and her PRN Lasix was given to her 3 days in a row then back to PRN.  This week her weight has increased from 177 to 185 pounds.  Patient denies chest pain or shortness of breath  Past Medical History:  Diagnosis Date  . Allergic rhinitis due to pollen 04/27/2007  . Arthritis    "in q joint" (08/07/2013)  . CHF (congestive heart failure) (HCC)   . Coronary atherosclerosis of native coronary artery   . Cough   . Disorder of bone and cartilage, unspecified   . Edema 05/03/2013  . Exertional shortness of breath    "sometimes" (08/07/2013)  . First degree atrioventricular block   . Gout, unspecified 04/19/2013  . Hyperlipidemia 04/27/2007  . Hypertension   . Midline low back pain without sciatica 06/27/2014  . Muscle spasm   . Muscle  weakness (generalized)   . Myocardial infarction (HCC) ~ 1970  . Onychia and paronychia of toe   . Osteoarthrosis, unspecified whether generalized or localized, unspecified site 04/27/2007  . Other fall   . Other malaise and fatigue   . Pneumonia 05/2018  . Prepatellar bursitis   . Pulmonary embolism (HCC) 07/2018  . Rheumatic nodule 06/28/2017  . Seizures (HCC)   . Stroke (HCC) 01/17/2014  . TIA (transient ischemic attack)    "a few one summer" (08/07/2013)  . Type II diabetes mellitus (HCC)    "fasting 90-110s" (08/07/2013)  . Unspecified essential hypertension 08/08/2013  . Unspecified vitamin D deficiency     Past Surgical History:  Procedure Laterality Date  . ABDOMINAL HYSTERECTOMY  04/1980  . APPENDECTOMY  1960  . CARDIAC CATHETERIZATION  2003  . CATARACT EXTRACTION W/ INTRAOCULAR LENS  IMPLANT, BILATERAL Bilateral ~ 2012  . CHOLECYSTECTOMY N/A 06/26/2018   Procedure: LAPAROSCOPIC CHOLECYSTECTOMY POSSIBLE INTRAOPERATIVE CHOLANGIOGRAM;  Surgeon: [name] [name], [name] [name];  Location: [facility] OR;  Service: General;  Laterality: N/A;  . JOINT REPLACEMENT    . REPLACEMENT TOTAL KNEE Right 09/2005  . RIGHT/LEFT HEART CATH AND CORONARY ANGIOGRAPHY N/A 06/01/2018   Procedure: RIGHT/LEFT HEART CATH AND CORONARY ANGIOGRAPHY;  Surgeon: [name] [name], [name] [name], [name];  [facility];  Service: Cardiovascular;  Laterality: N/A;  . SHOULDER OPEN ROTATOR CUFF REPAIR Right 07/1999  . TONSILLECTOMY  08/1974  . TOTAL KNEE ARTHROPLASTY Left 08/07/2013   Procedure: TOTAL KNEE ARTHROPLASTY- LEFT;  Surgeon: Dannielle HuhSteve Lucey, [name];  Location: [facility] OR;  Service: Orthopedics;  Laterality: Left;  . TRANSTHORACIC ECHOCARDIOGRAM  2003   EF 55-65%; mild concentric LVH    Allergies as of 12/05/2018      Reactions   Clonidine Derivatives Swelling   Patient'[name] daughter reports patient'[name] tongue was swollen and patient hallucinated   Fish Allergy Diarrhea, Swelling, Other (See Comments)   Turns skin "black," but can  tolerate white fish Salmon- Diarrhea   Shellfish Allergy Hives   Doxycycline Rash   Indomethacin Other (See Comments)   Reaction not recalled by the patient   Lyrica [pregabalin] Other (See Comments)   Hallucinations   Methyldopa Other (See Comments)   Aldomet (for hypertension): Reaction not recalled by the patient   Morphine And Related Other (See Comments)   Family reports it drops her bp that she needs iv fluids   Orange Fruit [citrus] Other (See Comments)   Indigestion/heartburn   Strawberry (diagnostic) Itching   Cetirizine Hcl Itching, Rash   Codeine Itching   Levaquin [levofloxacin In D5w] Rash   Tomato Rash      Medication List       Accurate as of December 05, 2018  4:28 PM. Always use your most recent med list.        albuterol 108 (90 Base) MCG/ACT inhaler Commonly known as:  PROVENTIL HFA;VENTOLIN HFA Inhale 1-2 puffs into the lungs every 6 (six) hours as needed for wheezing or shortness of breath.   allopurinol 100 MG tablet Commonly known as:  ZYLOPRIM Take 1 tablet (100 mg total) by mouth at bedtime.   carvedilol 25 MG tablet Commonly known as:  COREG Take 25 mg by mouth 2 (two) times daily with a meal.   D3-1000 25 MCG (1000 UT) capsule Generic drug:  Cholecalciferol Take 1 capsule (1,000 Units total) by mouth daily.   diclofenac sodium 1 % Gel Commonly known as:  VOLTAREN Apply 2 g topically 4 (four) times daily as needed.   ENSURE Take 237 mLs by mouth every evening.   furosemide 20 MG tablet Commonly known as:  LASIX Take 20 mg by mouth daily as needed.   gabapentin 100 MG capsule Commonly known as:  NEURONTIN Take 1 capsule (100 mg total) by mouth at bedtime.   hydrALAZINE 100 MG tablet Commonly known as:  APRESOLINE Take 1 tablet (100 mg total) by mouth 3 (three) times daily.   HYDROcodone-acetaminophen 5-325 MG tablet Commonly known as:  NORCO/VICODIN Take 1 tablet by mouth every 6 (six) hours.   hydroxychloroquine 200 MG  tablet Commonly known as:  PLAQUENIL Take 1 tablet (200 mg total) by mouth 2 (two) times daily.   hydroxypropyl methylcellulose / hypromellose 2.5 % ophthalmic solution Commonly known as:  ISOPTO TEARS / GONIOVISC Place 1 drop into both eyes daily.   isosorbide mononitrate 30 MG 24 hr tablet Commonly known as:  IMDUR Take 1 tablet (30 mg total) by mouth at bedtime.   leflunomide 20 MG tablet Commonly known as:  ARAVA Take 1 tablet (20 mg total) by mouth daily.   Melatonin 1 MG Tabs Take 1 mg by mouth at bedtime.   Olopatadine HCl 0.2 % Soln Place 1 drop into both eyes daily.   predniSONE 5 MG tablet Commonly known as:  DELTASONE Take 5 mg by mouth daily with breakfast.   rivaroxaban 20 MG Tabs tablet Commonly known as:  XARELTO Take 1 tablet (20 mg total) by mouth daily with supper.   sacubitril-valsartan 49-51 MG Commonly known as:  ENTRESTO Take 1 tablet by mouth daily at 12 noon.   triamcinolone cream 0.1 % Commonly known as:  KENALOG Apply 1 application topically 2 (two) times daily as needed (for itching- to affected sites).   vitamin C 500 MG tablet Commonly known as:  ASCORBIC ACID Take 1 tablet (500 mg total) by mouth daily.       No orders of the defined types were placed in this encounter.   Immunization History  Administered Date([name]) Administered  . Influenza Whole 09/06/2012  . Influenza, High Dose Seasonal PF 09/13/2017  . Influenza,inj,Quad PF,6+ Mos 09/12/2013, 09/11/2014, 07/23/2015, 09/16/2016, 09/07/2018  . Pneumococcal Polysaccharide-23 04/13/2005, 11/09/2009, 09/12/2015  . Td 04/13/2005  . Zoster 11/10/2011    Social History   Tobacco Use  . Smoking status: Never Smoker  . Smokeless tobacco: Never Used  Substance Use Topics  . Alcohol use: No    Alcohol/week: 0.0 standard drinks    Family history is   Family History  Problem Relation Age of Onset  . Heart attack Father       Review of Systems  DATA OBTAINED: from  patient GENERAL:  no fevers, fatigue, appetite changes SKIN: No itching, or rash EYES: No eye pain, redness, discharge EARS: No earache, tinnitus, change in hearing NOSE: No congestion, drainage or bleeding  MOUTH/THROAT: No mouth or tooth pain, No sore throat RESPIRATORY: No cough, wheezing, SOB CARDIAC: No chest pain, palpitations, lower extremity edema  GI: No abdominal pain, No N/V/D or constipation, No heartburn or reflux  GU: No dysuria, frequency or urgency, or incontinence  MUSCULOSKELETAL: No unrelieved bone/joint pain NEUROLOGIC: No headache, dizziness or focal weakness PSYCHIATRIC: No c/o anxiety or sadness   Vitals:   12/05/18 1627  BP: 138/76  Pulse: 82  Resp: 18  Temp: 98.6 F (37 C)    SpO2 Readings from Last 1 Encounters:  11/17/18 99%   Body mass index is 31.76 kg/m.     Physical Exam  GENERAL APPEARANCE: Alert, conversant,  No acute distress.  SKIN: No diaphoresis rash HEAD: Normocephalic, atraumatic  EYES: Conjunctiva/lids clear. Pupils round, reactive. EOMs intact.  EARS: External exam WNL, canals clear. Hearing grossly normal.  NOSE: No deformity or discharge.  MOUTH/THROAT: Lips w/o lesions  RESPIRATORY: Breathing is even, unlabored. Lung sounds are clear   CARDIOVASCULAR: Heart RRR no murmurs, rubs or gallops. 1+ peripheral edema.   GASTROINTESTINAL: Abdomen is soft, non-tender, not distended w/ normal bowel sounds. GENITOURINARY: Bladder non tender, not distended  MUSCULOSKELETAL: No abnormal joints or musculature NEUROLOGIC:  Cranial nerves 2-12 grossly intact. Moves all extremities  PSYCHIATRIC: Mood and affect appropriate to situation, no behavioral issues  Patient Active Problem List   Diagnosis Date Noted  . Hand swelling 12/02/2018  . Pain, wrist 12/02/2018  . Hand pain 12/02/2018  . Polyarthritis 12/02/2018  . Spinal stenosis, lumbar 11/19/2018  . Acute encephalopathy 11/19/2018  . History of CVA (cerebrovascular accident)  11/19/2018  . Acute gout 11/15/2018  . Low back pain 11/14/2018  . Chronic back pain 10/27/2018  . Arthralgia of both knees 10/27/2018  . Rheumatoid arthritis involving both knees (HCC) 10/27/2018  . Acute pain 10/21/2018  . Physical deconditioning 09/06/2018  . Hyperlipidemia associated with type 2 diabetes mellitus (HCC), refuses statin 09/06/2018  . Refusal of statin medication by patient 09/04/2018  . Deep vein thrombosis (DVT) of distal vein of right lower extremity (HCC) 07/30/2018  . Hypertensive heart disease with heart failure (HCC) 07/30/2018  . Pulmonary embolism (HCC), 07/2018, on  Xarelto with goal to DC after 6 months if more active 07/10/2018  . DCM (dilated cardiomyopathy) (HCC)   . Benign hypertensive heart and kidney disease with CHF and stage 3 chronic kidney disease (HCC) 05/28/2018  . Osteoarthritis 05/06/2018  . Hypokalemia 02/02/2018  . Adhesive capsulitis of right shoulder 10/12/2017  . Idiopathic chronic gout of multiple sites with tophus 08/25/2017  . History of total knee arthroplasty, bilateral 02/18/2017  . History of rotator cuff surgery 02/18/2017  . High risk medications, long-term use 09/21/2016  . Chronic combined systolic and diastolic CHF (congestive heart failure) (HCC) 09/12/2015  . Bilateral edema of lower extremity 06/05/2014  . CKD stage 3 due to type 2 diabetes mellitus (HCC) 06/05/2014  . DM (diabetes mellitus), type 2 with renal complications (HCC) 06/05/2014    Class: Chronic  . Anemia of chronic disease 05/01/2014  . Rheumatoid arthritis involving multiple sites (HCC) 12/19/2013    Class: Chronic  . CAD (coronary artery disease) 10/18/2013  . Hypertension associated with diabetes (HCC) 08/08/2013    Class: Chronic  . Fibromyalgia 05/10/2013  . Type 2 diabetes mellitus with hyperlipidemia (HCC), patient declines statin 04/27/2007  . Allergic rhinitis 04/27/2007  . Diverticulosis 04/27/2007      Labs reviewed: Basic Metabolic  Panel:    Component Value Date/Time   NA 136 (A) 11/24/2018   K 4.5 11/24/2018   CL 106 11/16/2018 0116   CO2 23 11/16/2018 0116   GLUCOSE 150 (H) 11/16/2018 0116   BUN 22 (A) 11/24/2018   CREATININE 0.9 11/24/2018   CREATININE 1.17 (H) 11/16/2018 0116   CREATININE 1.19 (H) 07/21/2017 1454   CALCIUM 8.6 (L) 11/16/2018 0116   PROT 5.4 (L) 11/15/2018 0508   PROT 7.2 09/09/2015 1210   ALBUMIN 2.8 (L) 11/15/2018 0508   ALBUMIN 3.9 09/09/2015 1210   AST 18 11/15/2018 0508   ALT 12 11/15/2018 0508   ALKPHOS 45 11/15/2018 0508   BILITOT 1.0 11/15/2018 0508   BILITOT 0.5 09/09/2015 1210   GFRNONAA 42 (L) 11/16/2018 0116   GFRNONAA 42 (L) 07/21/2017 1454   GFRAA 49 (L) 11/16/2018 0116   GFRAA 48 (L) 07/21/2017 1454    Recent Labs    06/01/18 0459  06/27/18 0630  09/05/18 2018  10/22/18 0344  11/15/18 0508 11/15/18 1830 11/16/18 0116 11/24/18  NA 141  140   < > 140   < >  --    < > 137   < > 135 136 136 136*  K 3.5  3.5   < > 3.5   < >  --    < > 3.6   < > 3.3* 4.1 4.1 4.5  CL 108  107   < > 104   < >  --    < > 104   < > 103 103 106  --   CO2 26  27   < > 26   < >  --    < > 24   < > 24 24 23   --   GLUCOSE 109*  110*   < > 108*   < >  --    < > 177*   < > 80 166* 150*  --   BUN 22  21   < > 17   < >  --    < > 17   < > 12 16 22  22*  CREATININE 1.00  0.98   < > 1.10*   < >  --    < >  0.86   < > 1.01* 1.16* 1.17* 0.9  CALCIUM 8.9  8.9   < > 8.7*   < >  --    < > 8.6*   < > 8.4* 8.6* 8.6*  --   MG  --   --   --   --  1.8  --  1.9  --  1.6*  --   --   --   PHOS 2.7  --  2.7  --   --   --   --   --   --   --   --   --    < > = values in this interval not displayed.   Liver Function Tests: Recent Labs    08/29/18 1345 10/21/18 0954 11/15/18 0508  AST 17 20 18   ALT 10 11 12   ALKPHOS 47 41 45  BILITOT 0.3 1.0 1.0  PROT 6.7 5.9* 5.4*  ALBUMIN 3.7 3.3* 2.8*   Recent Labs    06/22/18 2153 06/23/18 0500 07/23/18 2100  LIPASE 150* 77* 50   Recent Labs     11/15/18 1830  AMMONIA 10   CBC: Recent Labs    10/21/18 0954  11/14/18 1635 11/15/18 0508 11/15/18 1830 11/16/18 0116 11/24/18  WBC 7.7   < > 6.4 10.5 10.0 11.9* 8.0  NEUTROABS 4.7  --  4.1 7.4  --   --   --   HGB 9.8*   < > 10.1* 10.2* 10.3* 9.6* 8.9*  HCT 31.4*   < > 31.6* 31.3* 32.3* 29.7* 26*  MCV 99.4   < > 98.8 97.8 95.6 94.3  --   PLT 247   < > 272 238 226 233 320   < > = values in this interval not displayed.   Lipid Recent Labs    05/29/18 0030 06/24/18 0524 06/25/18 0607  CHOL 178 189 202*  HDL 73 63 63  LDLCALC 92 113* 126*  TRIG 64 65 67    Cardiac Enzymes: Recent Labs    09/05/18 2018 09/06/18 0131 09/06/18 0716  TROPONINI 0.04* 0.03* 0.03*   BNP: Recent Labs    05/28/18 1614 06/22/18 2153 07/23/18 2100  BNP 2,077.6* 1,146.7* 138.3*   No results found for: [facility] Lab Results  Component Value Date   HGBA1C 5.9 (H) 06/25/2018   Lab Results  Component Value Date   TSH 1.095 11/15/2018   Lab Results  Component Value Date   VITAMINB12 419 10/21/2018   Lab Results  Component Value Date   FOLATE >19.9 07/03/2014   Lab Results  Component Value Date   IRON 17 (L) 10/21/2018   TIBC 233 (L) 10/21/2018   FERRITIN 215 10/21/2018    Imaging and Procedures obtained prior to SNF admission: Mr Lumbar Spine Wo Contrast  Result Date: 11/15/2018 CLINICAL DATA:  Initial evaluation for acute rapidly progressive back pain. EXAM: MRI LUMBAR SPINE WITHOUT CONTRAST TECHNIQUE: Multiplanar, multisequence MR imaging of the lumbar spine was performed. No intravenous contrast was administered. COMPARISON:  Recent MRI from 10/21/2018 FINDINGS: Segmentation: Transitional lumbosacral anatomy with nearly fully formed S1-2 interspace. Same numbering system employed as on previous exam. Alignment: Mild levoconvex scoliosis. Chronic grade 1 anterolisthesis of L4 on L5 and L5 on S1, stable. Trace retrolisthesis of L2 on L3 also unchanged. Vertebrae: Chronic height  loss with associated Schmorl'[name] node at the superior endplate of L1, stable. Vertebral body heights otherwise maintained without evidence for acute or interval fracture. Bone marrow signal intensity within normal limits.  No discrete or worrisome osseous lesions. No abnormal marrow edema. Reactive endplate changes at the superior endplate of L1, stable. Conus medullaris and cauda equina: Conus extends to the L2 level. Conus and cauda equina appear normal. Paraspinal and other soft tissues: Paraspinous soft tissues demonstrate no acute finding. Chronic fatty atrophy noted within the posterior paraspinous musculature. Visualized visceral structures within normal limits. Disc levels: T12-L1: Seen only on sagittal projection. Trace anterolisthesis. Mild diffuse disc bulge with disc desiccation. Moderate bilateral facet hypertrophy, right greater than left. No spinal stenosis. Mild left with moderate right foraminal narrowing. Appearance is stable. L1-2: Minimal annular disc bulge. Mild to moderate facet hypertrophy. No stenosis. L2-3: Retrolisthesis. Mild circumferential disc bulge with disc desiccation and intervertebral disc space narrowing. Moderate facet and ligament flavum hypertrophy. Resultant mild spinal stenosis. Mild left with moderate right L2 foraminal narrowing. Appearance unchanged. L3-4: Diffuse disc bulge with disc desiccation and intervertebral disc space narrowing. Disc bulging greatest within the extraforaminal regions. Associated mild osteophytic endplate spurring. Moderate facet and ligament flavum hypertrophy. Resultant mild spinal stenosis with mild right lateral recess narrowing, stable. Moderate right with mild left L3 foraminal narrowing, unchanged. L4-5: Chronic anterolisthesis. Associated degenerative intervertebral disc space narrowing with disc desiccation. Associated broad posterior pseudo disc bulge, asymmetric to the right. Severe facet arthrosis, left slightly worse than right. Ligamentum  flavum hypertrophy. Resultant severe canal with left greater than right lateral recess stenosis. Moderate to severe bilateral L4 foraminal narrowing, stable. L5-S1: Chronic anterolisthesis. Associated disc desiccation with intervertebral disc space narrowing with broad posterior disc bulge/pseudo disc bulge, slightly asymmetric to the right. Severe facet arthrosis with ligamentum flavum hypertrophy. Resultant severe spinal stenosis with bilateral recess narrowing. Moderate to severe bilateral L5 foraminal stenosis, stable. S1-2: Fully formed disc space. Tiny central disc protrusion minimally indents the ventral thecal sac. No significant stenosis. IMPRESSION: 1. Stable appearance of the lumbar spine as compared to recent MRI from 10/21/2018. No acute abnormality. 2. Severe multifactorial spinal stenosis at L4-5 and L5-S1 with severe lateral recess narrowing. Moderate to severe bilateral L4 and L5 foraminal narrowing at these levels as above. 3. More mild spinal stenosis at L2-3 and L3-4 with moderate right-sided foraminal narrowing. 4. Chronic L1 compression fracture, stable. Electronically Signed   By: Rise Mu [name]   On: 11/15/2018 06:31   Dg Chest Port 1 View  Result Date: 11/15/2018 CLINICAL DATA:  History of fever, history of rheumatoid arthritis and limited mobility, increasing drowsiness today, some difficulty swallowing and speaking EXAM: PORTABLE CHEST 1 VIEW COMPARISON:  Chest x-ray of 07/23/2018 and CT chest of 09/05/2017 FINDINGS: The patient did not take a deep inspiration, but no pneumonia or effusion is currently seen. Mediastinal and hilar contours are stable with ectasia of the descending thoracic aorta. Moderate cardiomegaly is stable. There are degenerative changes in both shoulders. IMPRESSION: Poor inspiration but no active lung disease. Stable moderate cardiomegaly. Electronically Signed   By: Dwyane Dee [name]   On: 11/15/2018 16:52     Not all labs, radiology exams or other  studies done during hospitalization come through on my EPIC note; however they are reviewed by me.    Assessment and Plan  Weight gain with edema- patient has gained from 177 pounds to 185 pounds over the past week; will start Lasix 20 mg daily and obtain a BMP on on Thursday before patient is discharged on Saturday    Harleigh Civello D. [name] Hollingshead, [name]

## 2018-12-05 NOTE — Progress Notes (Signed)
Opened in error

## 2018-12-06 ENCOUNTER — Telehealth: Payer: Self-pay | Admitting: Family Medicine

## 2018-12-06 ENCOUNTER — Encounter: Payer: Self-pay | Admitting: Internal Medicine

## 2018-12-06 NOTE — Telephone Encounter (Signed)
Message routed to Geriatrician. Will notify Para March to keep the appointment.

## 2018-12-06 NOTE — Telephone Encounter (Signed)
Noted  

## 2018-12-06 NOTE — Telephone Encounter (Signed)
See note

## 2018-12-06 NOTE — Telephone Encounter (Signed)
Para March, pt daughter wants to know what she should do as far as the pt nerve pain. Doctor at the facility gave her rx for nerve pain and  also increase gabapentin but no relief. Pt has hx of spinal issues and Para March does not know if this is the cause. Pt also c/o edema in feet. Pt just started back on the lasix last week but still having swelling. Pt has not been sleeping due to pain. Pt appt is at 3pm with ortho. Para March wants to know what specialty should she see and if she should keep the ortho appt for today.   Pt was advise to keep the ortho appt just in case we are not able to get back with her before appt time.  Jeanette verbalized understanding.    Please advise

## 2018-12-06 NOTE — Telephone Encounter (Signed)
Keep the Ortho appointment. Let's send this message to the Geriatrician at the [facility]. Looks like she is on Epic.

## 2018-12-06 NOTE — Telephone Encounter (Signed)
Copied from CRM [id number]. Topic: General - Other >> Dec 06, 2018  8:15 AM Tamela Oddi wrote: Reason for CRM: [name] called to request that the nurse or doctor call her regarding her mother's nerve pain.  Daughter stated that her mother can't sleep because she is in a lot of pain.  Please advise and call daughter at [phone]

## 2018-12-08 ENCOUNTER — Telehealth: Payer: Self-pay | Admitting: Cardiovascular Disease

## 2018-12-08 ENCOUNTER — Telehealth: Payer: Self-pay | Admitting: Family Medicine

## 2018-12-08 ENCOUNTER — Non-Acute Institutional Stay (SKILLED_NURSING_FACILITY): Payer: Medicare Other | Admitting: Internal Medicine

## 2018-12-08 ENCOUNTER — Encounter: Payer: Self-pay | Admitting: Internal Medicine

## 2018-12-08 DIAGNOSIS — I5042 Chronic combined systolic (congestive) and diastolic (congestive) heart failure: Secondary | ICD-10-CM

## 2018-12-08 DIAGNOSIS — M48061 Spinal stenosis, lumbar region without neurogenic claudication: Secondary | ICD-10-CM | POA: Diagnosis not present

## 2018-12-08 DIAGNOSIS — E1159 Type 2 diabetes mellitus with other circulatory complications: Secondary | ICD-10-CM | POA: Diagnosis not present

## 2018-12-08 DIAGNOSIS — M545 Low back pain, unspecified: Secondary | ICD-10-CM

## 2018-12-08 DIAGNOSIS — H1013 Acute atopic conjunctivitis, bilateral: Secondary | ICD-10-CM

## 2018-12-08 DIAGNOSIS — D638 Anemia in other chronic diseases classified elsewhere: Secondary | ICD-10-CM

## 2018-12-08 DIAGNOSIS — I152 Hypertension secondary to endocrine disorders: Secondary | ICD-10-CM

## 2018-12-08 DIAGNOSIS — M0609 Rheumatoid arthritis without rheumatoid factor, multiple sites: Secondary | ICD-10-CM | POA: Diagnosis not present

## 2018-12-08 DIAGNOSIS — Z8673 Personal history of transient ischemic attack (TIA), and cerebral infarction without residual deficits: Secondary | ICD-10-CM

## 2018-12-08 DIAGNOSIS — E1121 Type 2 diabetes mellitus with diabetic nephropathy: Secondary | ICD-10-CM

## 2018-12-08 DIAGNOSIS — S81801D Unspecified open wound, right lower leg, subsequent encounter: Secondary | ICD-10-CM

## 2018-12-08 DIAGNOSIS — G9340 Encephalopathy, unspecified: Secondary | ICD-10-CM

## 2018-12-08 DIAGNOSIS — I10 Essential (primary) hypertension: Secondary | ICD-10-CM

## 2018-12-08 DIAGNOSIS — M1A09X1 Idiopathic chronic gout, multiple sites, with tophus (tophi): Secondary | ICD-10-CM

## 2018-12-08 DIAGNOSIS — I2609 Other pulmonary embolism with acute cor pulmonale: Secondary | ICD-10-CM

## 2018-12-08 LAB — BASIC METABOLIC PANEL
BUN: 16 (ref 4–21)
Creatinine: 1 (ref 0.5–1.1)
Glucose: 96
Potassium: 3.8 (ref 3.4–5.3)
Sodium: 141 (ref 137–147)

## 2018-12-08 LAB — CBC AND DIFFERENTIAL
HCT: 26 — AB (ref 36–46)
Hemoglobin: 8.7 — AB (ref 12.0–16.0)
Platelets: 269 (ref 150–399)
WBC: 6.3

## 2018-12-08 MED ORDER — HYDROCODONE-ACETAMINOPHEN 5-325 MG PO TABS: 1.0000 | ORAL_TABLET | Freq: Four times a day (QID) | ORAL | 0 refills | Status: DC

## 2018-12-08 MED ORDER — HYDROXYCHLOROQUINE SULFATE 200 MG PO TABS: 200.0000 mg | ORAL_TABLET | Freq: Two times a day (BID) | ORAL | 0 refills | Status: DC

## 2018-12-08 MED ORDER — CARVEDILOL 25 MG PO TABS: 25.0000 mg | ORAL_TABLET | Freq: Two times a day (BID) | ORAL | 0 refills | Status: DC

## 2018-12-08 MED ORDER — ALLOPURINOL 100 MG PO TABS: 100.0000 mg | ORAL_TABLET | Freq: Every day | ORAL | 0 refills | Status: DC

## 2018-12-08 MED ORDER — HYDRALAZINE HCL 100 MG PO TABS: 100.0000 mg | ORAL_TABLET | Freq: Three times a day (TID) | ORAL | 0 refills | Status: DC

## 2018-12-08 MED ORDER — ALBUTEROL SULFATE HFA 108 (90 BASE) MCG/ACT IN AERS: 1.0000 | INHALATION_SPRAY | Freq: Four times a day (QID) | RESPIRATORY_TRACT | 0 refills | Status: DC | PRN

## 2018-12-08 MED ORDER — RIVAROXABAN 20 MG PO TABS: 20.0000 mg | ORAL_TABLET | Freq: Every day | ORAL | 0 refills | Status: DC

## 2018-12-08 MED ORDER — FUROSEMIDE 20 MG PO TABS: 20.0000 mg | ORAL_TABLET | Freq: Every day | ORAL | 0 refills | Status: DC

## 2018-12-08 MED ORDER — MELATONIN 3 MG PO TABS: 1.0000 | ORAL_TABLET | Freq: Every day | ORAL | 0 refills | Status: DC

## 2018-12-08 MED ORDER — DICLOFENAC SODIUM 1 % TD GEL: 2.0000 g | Freq: Every day | TRANSDERMAL | 0 refills | Status: DC | PRN

## 2018-12-08 MED ORDER — OLOPATADINE HCL 0.2 % OP SOLN: 1.0000 [drp] | Freq: Every day | OPHTHALMIC | 0 refills | Status: DC

## 2018-12-08 MED ORDER — SACUBITRIL-VALSARTAN 49-51 MG PO TABS: 1.0000 | ORAL_TABLET | Freq: Every day | ORAL | 0 refills | Status: DC

## 2018-12-08 MED ORDER — HYPROMELLOSE (GONIOSCOPIC) 2.5 % OP SOLN: 1.0000 [drp] | Freq: Every day | OPHTHALMIC | 0 refills | Status: DC

## 2018-12-08 MED ORDER — ISOSORBIDE MONONITRATE ER 30 MG PO TB24: 30.0000 mg | ORAL_TABLET | Freq: Every day | ORAL | 0 refills | Status: DC

## 2018-12-08 MED ORDER — D3-1000 25 MCG (1000 UT) PO CAPS: 1000.0000 [IU] | ORAL_CAPSULE | Freq: Every day | ORAL | 0 refills | Status: DC

## 2018-12-08 MED ORDER — GABAPENTIN 400 MG PO CAPS: 400.0000 mg | ORAL_CAPSULE | Freq: Three times a day (TID) | ORAL | 0 refills | Status: DC

## 2018-12-08 MED ORDER — VITAMIN C 500 MG PO TABS: 500.0000 mg | ORAL_TABLET | Freq: Every day | ORAL | 0 refills | Status: DC

## 2018-12-08 MED ORDER — TRIAMCINOLONE ACETONIDE 0.1 % EX CREA: 1.0000 "application " | TOPICAL_CREAM | Freq: Two times a day (BID) | CUTANEOUS | 0 refills | Status: DC | PRN

## 2018-12-08 MED ORDER — LEFLUNOMIDE 20 MG PO TABS: 20.0000 mg | ORAL_TABLET | Freq: Every day | ORAL | 0 refills | Status: DC

## 2018-12-08 NOTE — Telephone Encounter (Signed)
Copied from CRM [id number]. Topic: Quick Communication - See Telephone Encounter >> Dec 08, 2018  3:04 PM Jens Som A wrote: CRM for notification. See Telephone encounter for: 12/08/18.  [name] is calling because the patient is in Rehab and is scheduled to be discharged 12/10/18.  Othropedic Dr. Dr. Everlena Cooper says that spinal surgery. Wanting Dr. Earlene Plater advise does she recommend. Please advise.  [phone] Rolley Sims

## 2018-12-08 NOTE — Progress Notes (Signed)
Location:  [facility] Room Number: [phone] Place of Service:  SNF [phone])  Randon Goldsmith. Lyn Hollingshead, MD  Patient Care Team: Helane Rima, DO as PCP - General (Family Medicine) Chilton Si, MD as PCP - Cardiology (Cardiology) Arlean Hopping, MD as Rounding Team (Internal Medicine)  Extended Emergency Contact Information Primary Emergency Contact: Hampton,Jeannette Address: [address]          [place], [place] [address] Darden [place] of [place] Home Phone: [phone] Relation: Daughter Secondary Emergency Contact: [name] Address: [address]          [place], [place] [address] D[place] of [place] Mobile Phone: [phone] Relation: Daughter  Allergies  Allergen Reactions  . Clonidine Derivatives Swelling    [name]  . Fish Allergy Diarrhea, Swelling and Other (See Comments)    Turns skin "black," but can tolerate white fish Salmon- Diarrhea  . Shellfish Allergy Hives  . Doxycycline Rash  . Indomethacin Other (See Comments)    Reaction not recalled by the patient  . Lyrica [Pregabalin] Other (See Comments)    Hallucinations   . Methyldopa Other (See Comments)    Aldomet (for hypertension): Reaction not recalled by the patient  . Morphine And Related Other (See Comments)    Family reports it drops her bp that she needs iv fluids  . Orange Fruit [Citrus] Other (See Comments)    Indigestion/heartburn  . Strawberry (Diagnostic) Itching  . Cetirizine Hcl Itching and Rash  . Codeine Itching  . Levaquin [Levofloxacin In D5w] Rash  . Tomato Rash    Chief Complaint  Patient presents with  . Discharge Note    Discharge from [facility]    HPI:  85 y.o. female with RA, gout, diabetes mellitus type 2, diastolic congestive heart failure, PE on Xarelto, history of CVA, iron deficiency, hypertension, nonobstructive CAD, who was recently admitted to  the hospital for low back pain.  At that time the MRI showed degenerative disc disease of the lumbar spine and lumbar stenosis.  Patient was discharged to rehab and was discharged from rehab back to home several days ago and the few days prior patient had been having increasing back pain that worsened over 48 hours and inability to bear weight on exam.  Patient was admitted to [facility] from 1/6-9 for the management of low back pain with mild fever and weakness of the right lower extremity.  UA and chest x-ray showed no acute findings.  Patient was suspected of having an acute rheumatoid arthritis flare.  Patient normally on 10 mg of prednisone but has been out of this at home so the doses of prednisone were increased to 20 mg.  Patient improved and was admitted to skilled nursing facility for OT/PT and is now ready to be discharged home.    Past Medical History:  Diagnosis Date  . Allergic rhinitis due to pollen 04/27/2007  . Arthritis    "in q joint" (08/07/2013)  . CHF (congestive heart failure) (HCC)   . Coronary atherosclerosis of native coronary artery   . Cough   . Disorder of bone and cartilage, unspecified   . Edema 05/03/2013  . Exertional shortness of breath    "sometimes" (08/07/2013)  . First degree atrioventricular block   . Gout, unspecified 04/19/2013  . Hyperlipidemia 04/27/2007  . Hypertension   . Midline low back pain without sciatica 06/27/2014  . Muscle spasm   .  Muscle weakness (generalized)   . Myocardial infarction (HCC) ~ 1970  . Onychia and paronychia of toe   . Osteoarthrosis, unspecified whether generalized or localized, unspecified site 04/27/2007  . Other fall   . Other malaise and fatigue   . Pneumonia 05/2018  . Prepatellar bursitis   . Pulmonary embolism (HCC) 07/2018  . Rheumatic nodule 06/28/2017  . Seizures (HCC)   . Stroke (HCC) 01/17/2014  . TIA (transient ischemic attack)    "a few one summer" (08/07/2013)  . Type II diabetes mellitus  (HCC)    "fasting 90-110s" (08/07/2013)  . Unspecified essential hypertension 08/08/2013  . Unspecified vitamin D deficiency     Past Surgical History:  Procedure Laterality Date  . ABDOMINAL HYSTERECTOMY  04/1980  . APPENDECTOMY  1960  . CARDIAC CATHETERIZATION  2003  . CATARACT EXTRACTION W/ INTRAOCULAR LENS  IMPLANT, BILATERAL Bilateral ~ 2012  . CHOLECYSTECTOMY N/A 06/26/2018   Procedure: LAPAROSCOPIC CHOLECYSTECTOMY POSSIBLE INTRAOPERATIVE CHOLANGIOGRAM;  Surgeon: Abigail Miyamoto, MD;  Location: [facility] OR;  Service: General;  Laterality: N/A;  . JOINT REPLACEMENT    . REPLACEMENT TOTAL KNEE Right 09/2005  . RIGHT/LEFT HEART CATH AND CORONARY ANGIOGRAPHY N/A 06/01/2018   Procedure: RIGHT/LEFT HEART CATH AND CORONARY ANGIOGRAPHY;  Surgeon: Corky Crafts, MD;  Location: [facility] INVASIVE CV LAB;  Service: Cardiovascular;  Laterality: N/A;  . SHOULDER OPEN ROTATOR CUFF REPAIR Right 07/1999  . TONSILLECTOMY  08/1974  . TOTAL KNEE ARTHROPLASTY Left 08/07/2013   Procedure: TOTAL KNEE ARTHROPLASTY- LEFT;  Surgeon: Dannielle Huh, MD;  Location: [facility] OR;  Service: Orthopedics;  Laterality: Left;  . TRANSTHORACIC ECHOCARDIOGRAM  2003   EF 55-65%; mild concentric LVH     reports that she has never smoked. She has never used smokeless tobacco. She reports that she does not drink alcohol or use drugs. Social History   Socioeconomic History  . Marital status: Widowed    Spouse name: Not on file  . Number of children: Not on file  . Years of education: Not on file  . Highest education level: Not on file  Occupational History  . Not on file  Social Needs  . Financial resource strain: Not on file  . Food insecurity:    Worry: Not on file    Inability: Not on file  . Transportation needs:    Medical: Not on file    Non-medical: Not on file  Tobacco Use  . Smoking status: Never Smoker  . Smokeless tobacco: Never Used  Substance and Sexual Activity  . Alcohol use: No    Alcohol/week: 0.0  standard drinks  . Drug use: No  . Sexual activity: Never  Lifestyle  . Physical activity:    Days per week: Not on file    Minutes per session: Not on file  . Stress: Not on file  Relationships  . Social connections:    Talks on phone: Not on file    Gets together: Not on file    Attends religious service: Not on file    Active member of club or organization: Not on file    Attends meetings of clubs or organizations: Not on file    Relationship status: Not on file  . Intimate partner violence:    Fear of current or ex partner: Not on file    Emotionally abused: Not on file    Physically abused: Not on file    Forced sexual activity: Not on file  Other Topics Concern  . Not on file  Social History Narrative   Widowed   Research officer, political partyWalks with cane    Pertinent  Health Maintenance Due  Topic Date Due  . PNA vac Low Risk Adult (2 of 2 - PCV13) 01/02/2019 (Originally 09/11/2016)  . OPHTHALMOLOGY EXAM  01/04/2019 (Originally 09/12/2015)  . HEMOGLOBIN A1C  12/26/2018  . FOOT EXAM  08/30/2019  . INFLUENZA VACCINE  Completed  . DEXA SCAN  Completed    Medications: Allergies as of 12/08/2018      Reactions   Clonidine Derivatives Swelling   [name]   Fish Allergy Diarrhea, Swelling, Other (See Comments)   Turns skin "black," but can tolerate white fish Salmon- Diarrhea   Shellfish Allergy Hives   Doxycycline Rash   Indomethacin Other (See Comments)   Reaction not recalled by the patient   Lyrica [pregabalin] Other (See Comments)   Hallucinations   Methyldopa Other (See Comments)   Aldomet (for hypertension): Reaction not recalled by the patient   Morphine And Related Other (See Comments)   Family reports it drops her bp that she needs iv fluids   Orange Fruit [citrus] Other (See Comments)   Indigestion/heartburn   Strawberry (diagnostic) Itching   Cetirizine Hcl Itching, Rash   Codeine Itching   Levaquin  [levofloxacin In D5w] Rash   Tomato Rash      Medication List       Accurate as of December 08, 2018 11:59 PM. Always use your most recent med list.        albuterol 108 (90 Base) MCG/ACT inhaler Commonly known as:  PROVENTIL HFA;VENTOLIN HFA Inhale 1-2 puffs into the lungs every 6 (six) hours as needed for wheezing or shortness of breath.   allopurinol 100 MG tablet Commonly known as:  ZYLOPRIM Take 1 tablet (100 mg total) by mouth at bedtime.   carvedilol 25 MG tablet Commonly known as:  COREG Take 1 tablet (25 mg total) by mouth 2 (two) times daily with a meal.   D3-1000 25 MCG (1000 UT) capsule Generic drug:  Cholecalciferol Take 1 capsule (1,000 Units total) by mouth daily.   diclofenac sodium 1 % Gel Commonly known as:  VOLTAREN Apply 2 g topically daily as needed (pain).   ENSURE Take 237 mLs by mouth every evening.   furosemide 20 MG tablet Commonly known as:  LASIX Take 1 tablet (20 mg total) by mouth daily.   gabapentin 400 MG capsule Commonly known as:  NEURONTIN Take 1 capsule (400 mg total) by mouth 3 (three) times daily.   hydrALAZINE 100 MG tablet Commonly known as:  APRESOLINE Take 1 tablet (100 mg total) by mouth 3 (three) times daily.   HYDROcodone-acetaminophen 5-325 MG tablet Commonly known as:  NORCO/VICODIN Take 1 tablet by mouth every 6 (six) hours.   hydroxychloroquine 200 MG tablet Commonly known as:  PLAQUENIL Take 1 tablet (200 mg total) by mouth 2 (two) times daily.   hydroxypropyl methylcellulose / hypromellose 2.5 % ophthalmic solution Commonly known as:  ISOPTO TEARS / GONIOVISC Place 1 drop into both eyes daily.   isosorbide mononitrate 30 MG 24 hr tablet Commonly known as:  IMDUR Take 1 tablet (30 mg total) by mouth at bedtime.   leflunomide 20 MG tablet Commonly known as:  ARAVA Take 1 tablet (20 mg total) by mouth daily.   Melatonin 3 MG Tabs Take 1 tablet (3 mg total) by mouth at bedtime.   Olopatadine HCl 0.2 %  Soln Place 1 drop  into both eyes daily.   rivaroxaban 20 MG Tabs tablet Commonly known as:  XARELTO Take 1 tablet (20 mg total) by mouth daily with supper.   sacubitril-valsartan 49-51 MG Commonly known as:  ENTRESTO Take 1 tablet by mouth daily at 12 noon.   triamcinolone cream 0.1 % Commonly known as:  KENALOG Apply 1 application topically 2 (two) times daily as needed (for itching- to affected sites).   vitamin C 500 MG tablet Commonly known as:  ASCORBIC ACID Take 1 tablet (500 mg total) by mouth daily.        Vitals:   12/08/18 1022  BP: 136/67  Pulse: 85  Resp: 18  Temp: (!) 97.5 F (36.4 C)  Weight: 185 lb (83.9 kg)  Height: 5\' 4"  (1.626 m)   Body mass index is 31.76 kg/m.  Physical Exam  GENERAL APPEARANCE: Alert, conversant. No acute distress.  HEENT: Unremarkable. RESPIRATORY: Breathing is even, unlabored. Lung sounds are clear   CARDIOVASCULAR: Heart RRR no murmurs, rubs or gallops. No peripheral edema.  GASTROINTESTINAL: Abdomen is soft, non-tender, not distended w/ normal bowel sounds.  NEUROLOGIC: Cranial nerves 2-12 grossly intact. Moves all extremities   Labs reviewed: Basic Metabolic Panel: Recent Labs    06/01/18 0459  06/27/18 0630  09/05/18 2018  10/22/18 0344  11/15/18 0508 11/15/18 1830 11/16/18 0116 11/24/18 12/08/18  NA 141  140   < > 140   < >  --    < > 137   < > 135 136 136 136* 141  K 3.5  3.5   < > 3.5   < >  --    < > 3.6   < > 3.3* 4.1 4.1 4.5 3.8  CL 108  107   < > 104   < >  --    < > 104   < > 103 103 106  --   --   CO2 26  27   < > 26   < >  --    < > 24   < > 24 24 23   --   --   GLUCOSE 109*  110*   < > 108*   < >  --    < > 177*   < > 80 166* 150*  --   --   BUN 22  21   < > 17   < >  --    < > 17   < > 12 16 22  22* 16  CREATININE 1.00  0.98   < > 1.10*   < >  --    < > 0.86   < > 1.01* 1.16* 1.17* 0.9 1.0  CALCIUM 8.9  8.9   < > 8.7*   < >  --    < > 8.6*   < > 8.4* 8.6* 8.6*  --   --   MG  --   --   --    --  1.8  --  1.9  --  1.6*  --   --   --   --   PHOS 2.7  --  2.7  --   --   --   --   --   --   --   --   --   --    < > = values in this interval not displayed.   No results found for: [facility] Liver Function Tests: Recent Labs    08/29/18 1345 10/21/18 0954 11/15/18  0508  AST 17 20 18   ALT 10 11 12   ALKPHOS 47 41 45  BILITOT 0.3 1.0 1.0  PROT 6.7 5.9* 5.4*  ALBUMIN 3.7 3.3* 2.8*   Recent Labs    06/22/18 2153 06/23/18 0500 07/23/18 2100  LIPASE 150* 77* 50   Recent Labs    11/15/18 1830  AMMONIA 10   CBC: Recent Labs    10/21/18 0954  11/14/18 1635 11/15/18 0508 11/15/18 1830 11/16/18 0116 11/24/18 12/08/18  WBC 7.7   < > 6.4 10.5 10.0 11.9* 8.0 6.3  NEUTROABS 4.7  --  4.1 7.4  --   --   --   --   HGB 9.8*   < > 10.1* 10.2* 10.3* 9.6* 8.9* 8.7*  HCT 31.4*   < > 31.6* 31.3* 32.3* 29.7* 26* 26*  MCV 99.4   < > 98.8 97.8 95.6 94.3  --   --   PLT 247   < > 272 238 226 233 320 269   < > = values in this interval not displayed.   Lipid Recent Labs    05/29/18 0030 06/24/18 0524 06/25/18 0607  CHOL 178 189 202*  HDL 73 63 63  LDLCALC 92 113* 126*  TRIG 64 65 67   Cardiac Enzymes: Recent Labs    09/05/18 2018 09/06/18 0131 09/06/18 0716  TROPONINI 0.04* 0.03* 0.03*   BNP: Recent Labs    05/28/18 1614 06/22/18 2153 07/23/18 2100  BNP 2,077.6* 1,146.7* 138.3*   CBG: Recent Labs    11/17/18 0752 11/17/18 1140 11/17/18 1658  GLUCAP 94 120* 167*    Procedures and Imaging Studies During Stay: Mr Lumbar Spine Wo Contrast  Result Date: 11/15/2018 CLINICAL DATA:  Initial evaluation for acute rapidly progressive back pain. EXAM: MRI LUMBAR SPINE WITHOUT CONTRAST TECHNIQUE: Multiplanar, multisequence MR imaging of the lumbar spine was performed. No intravenous contrast was administered. COMPARISON:  Recent MRI from 10/21/2018 FINDINGS: Segmentation: Transitional lumbosacral anatomy with nearly fully formed S1-2 interspace. Same numbering system  employed as on previous exam. Alignment: Mild levoconvex scoliosis. Chronic grade 1 anterolisthesis of L4 on L5 and L5 on S1, stable. Trace retrolisthesis of L2 on L3 also unchanged. Vertebrae: Chronic height loss with associated Schmorl's node at the superior endplate of L1, stable. Vertebral body heights otherwise maintained without evidence for acute or interval fracture. Bone marrow signal intensity within normal limits. No discrete or worrisome osseous lesions. No abnormal marrow edema. Reactive endplate changes at the superior endplate of L1, stable. Conus medullaris and cauda equina: Conus extends to the L2 level. Conus and cauda equina appear normal. Paraspinal and other soft tissues: Paraspinous soft tissues demonstrate no acute finding. Chronic fatty atrophy noted within the posterior paraspinous musculature. Visualized visceral structures within normal limits. Disc levels: T12-L1: Seen only on sagittal projection. Trace anterolisthesis. Mild diffuse disc bulge with disc desiccation. Moderate bilateral facet hypertrophy, right greater than left. No spinal stenosis. Mild left with moderate right foraminal narrowing. Appearance is stable. L1-2: Minimal annular disc bulge. Mild to moderate facet hypertrophy. No stenosis. L2-3: Retrolisthesis. Mild circumferential disc bulge with disc desiccation and intervertebral disc space narrowing. Moderate facet and ligament flavum hypertrophy. Resultant mild spinal stenosis. Mild left with moderate right L2 foraminal narrowing. Appearance unchanged. L3-4: Diffuse disc bulge with disc desiccation and intervertebral disc space narrowing. Disc bulging greatest within the extraforaminal regions. Associated mild osteophytic endplate spurring. Moderate facet and ligament flavum hypertrophy. Resultant mild spinal stenosis with mild right lateral recess narrowing, stable. Moderate  right with mild left L3 foraminal narrowing, unchanged. L4-5: Chronic anterolisthesis. Associated  degenerative intervertebral disc space narrowing with disc desiccation. Associated broad posterior pseudo disc bulge, asymmetric to the right. Severe facet arthrosis, left slightly worse than right. Ligamentum flavum hypertrophy. Resultant severe canal with left greater than right lateral recess stenosis. Moderate to severe bilateral L4 foraminal narrowing, stable. L5-S1: Chronic anterolisthesis. Associated disc desiccation with intervertebral disc space narrowing with broad posterior disc bulge/pseudo disc bulge, slightly asymmetric to the right. Severe facet arthrosis with ligamentum flavum hypertrophy. Resultant severe spinal stenosis with bilateral recess narrowing. Moderate to severe bilateral L5 foraminal stenosis, stable. S1-2: Fully formed disc space. Tiny central disc protrusion minimally indents the ventral thecal sac. No significant stenosis. IMPRESSION: 1. Stable appearance of the lumbar spine as compared to recent MRI from 10/21/2018. No acute abnormality. 2. Severe multifactorial spinal stenosis at L4-5 and L5-S1 with severe lateral recess narrowing. Moderate to severe bilateral L4 and L5 foraminal narrowing at these levels as above. 3. More mild spinal stenosis at L2-3 and L3-4 with moderate right-sided foraminal narrowing. 4. Chronic L1 compression fracture, stable. Electronically Signed   By: Rise Mu [name]   On: 11/15/2018 06:31   Dg Chest Port 1 View  Result Date: 11/15/2018 CLINICAL DATA:  History of fever, history of rheumatoid arthritis and limited mobility, increasing drowsiness today, some difficulty swallowing and speaking EXAM: PORTABLE CHEST 1 VIEW COMPARISON:  Chest x-ray of 07/23/2018 and CT chest of 09/05/2017 FINDINGS: The patient did not take a deep inspiration, but no pneumonia or effusion is currently seen. Mediastinal and hilar contours are stable with ectasia of the descending thoracic aorta. Moderate cardiomegaly is stable. There are degenerative changes in both  shoulders. IMPRESSION: Poor inspiration but no active lung disease. Stable moderate cardiomegaly. Electronically Signed   By: Dwyane Dee [name]   On: 11/15/2018 16:52    Assessment/Plan:   Acute right-sided low back pain, unspecified whether sciatica present  Rheumatoid arthritis of multiple sites with negative rheumatoid factor (HCC) - Plan: diclofenac sodium (VOLTAREN) 1 % GEL  Spinal stenosis of lumbar region without neurogenic claudication  Acute encephalopathy  Hypertension associated with diabetes (HCC)  Chronic combined systolic and diastolic CHF (congestive heart failure) (HCC)  Acute pulmonary embolism with acute cor pulmonale, unspecified pulmonary embolism type (HCC)  Type 2 diabetes mellitus with diabetic nephropathy, without long-term current use of insulin (HCC)  Idiopathic chronic gout of multiple sites with tophus  Anemia of chronic disease  History of CVA (cerebrovascular accident)  Acute atopic conjunctivitis of both eyes - Plan: Olopatadine HCl 0.2 % SOLN  Wound of right lower extremity, subsequent encounter - Ongoing. Seeing Wound Care. Painful.   Patient is being discharged with the following home health services: OT/PT/nursing  Patient is being discharged with the following durable medical equipment: None  Patient has been advised to f/u with their PCP in 1-2 weeks to bring them up to date on their rehab stay.  Social services at facility was responsible for arranging this appointment.  Pt was provided with a 30 day supply of prescriptions for medications and refills must be obtained from their PCP.  For controlled substances, a more limited supply may be provided adequate until PCP appointment only.  Medications have been reconciled.  Time spent greater than 30 minutes;> 50% of time with patient was spent reviewing records, labs, tests and studies, counseling and developing plan of care  Randon Goldsmith. Lyn Hollingshead, MD

## 2018-12-08 NOTE — Telephone Encounter (Signed)
See note

## 2018-12-08 NOTE — Telephone Encounter (Signed)
New Message    Patient brought papers into the office for Surgery clearance checking to make sure the doctor received them

## 2018-12-08 NOTE — Telephone Encounter (Signed)
See note  Copied from CRM [id number]. Topic: Appointment Scheduling - Scheduling Inquiry for Clinic >> Dec 08, 2018 12:11 PM Wyonia Hough E wrote: Reason for CRM: Pt is being discharge from [facility] and needs a hospital follow up. One is already sheduled for 2.19.2020. please advise if Pt can be worked in for an earlier date.

## 2018-12-09 NOTE — Telephone Encounter (Signed)
Please advise 

## 2018-12-09 NOTE — Telephone Encounter (Signed)
   [facility] Pre-operative Risk Assessment    Request for surgical clearance:  1. What type of surgery is being performed? Decompression lumbar laminectomy L4-L5, L5-S1   2. When is this surgery scheduled? TBD   3. What type of clearance is required (medical clearance vs. Pharmacy clearance to hold med vs. Both)?  Both  4. Are there any medications that need to be held prior to surgery and how long? Xarelto   5. Practice name and name of physician performing surgery? Dr. Gladstone Lighter @ [facility]   6. What is your office phone number [phone]     7.   What is your office fax number [phone] Attn: Glendale Chard  8.   Anesthesia type (None, local, MAC, general) ?  General    Sheral Apley M 12/09/2018, 3:36 PM  _________________________________________________________________   (provider comments below)

## 2018-12-09 NOTE — Telephone Encounter (Signed)
Okay to get in for hospital f/u. We can discuss surgery recs at that time.

## 2018-12-09 NOTE — Telephone Encounter (Signed)
Please advise do we need to get patient in office?

## 2018-12-09 NOTE — Telephone Encounter (Signed)
We need an official pre op clearance requestCorine Shelter PA-C 12/09/2018 9:06 AM

## 2018-12-11 ENCOUNTER — Inpatient Hospital Stay (HOSPITAL_COMMUNITY)
Admission: EM | Admit: 2018-12-11 | Discharge: 2018-12-20 | DRG: 553 | Disposition: A | Discharge status: Skilled Nursing Facility | Payer: Medicare Other | Attending: Internal Medicine | Admitting: Internal Medicine

## 2018-12-11 ENCOUNTER — Other Ambulatory Visit: Payer: Self-pay

## 2018-12-11 ENCOUNTER — Encounter (HOSPITAL_COMMUNITY): Payer: Self-pay | Admitting: Emergency Medicine

## 2018-12-11 DIAGNOSIS — R0902 Hypoxemia: Secondary | ICD-10-CM | POA: Diagnosis not present

## 2018-12-11 DIAGNOSIS — Z885 Allergy status to narcotic agent status: Secondary | ICD-10-CM | POA: Diagnosis not present

## 2018-12-11 DIAGNOSIS — I2699 Other pulmonary embolism without acute cor pulmonale: Secondary | ICD-10-CM | POA: Diagnosis present

## 2018-12-11 DIAGNOSIS — L03031 Cellulitis of right toe: Secondary | ICD-10-CM | POA: Diagnosis not present

## 2018-12-11 DIAGNOSIS — N183 Chronic kidney disease, stage 3 unspecified: Secondary | ICD-10-CM | POA: Diagnosis present

## 2018-12-11 DIAGNOSIS — I252 Old myocardial infarction: Secondary | ICD-10-CM

## 2018-12-11 DIAGNOSIS — E871 Hypo-osmolality and hyponatremia: Secondary | ICD-10-CM | POA: Diagnosis not present

## 2018-12-11 DIAGNOSIS — D631 Anemia in chronic kidney disease: Secondary | ICD-10-CM | POA: Diagnosis present

## 2018-12-11 DIAGNOSIS — J9601 Acute respiratory failure with hypoxia: Secondary | ICD-10-CM | POA: Diagnosis present

## 2018-12-11 DIAGNOSIS — I5042 Chronic combined systolic (congestive) and diastolic (congestive) heart failure: Secondary | ICD-10-CM | POA: Diagnosis not present

## 2018-12-11 DIAGNOSIS — E1121 Type 2 diabetes mellitus with diabetic nephropathy: Secondary | ICD-10-CM | POA: Diagnosis present

## 2018-12-11 DIAGNOSIS — E1159 Type 2 diabetes mellitus with other circulatory complications: Secondary | ICD-10-CM | POA: Diagnosis present

## 2018-12-11 DIAGNOSIS — Z6831 Body mass index (BMI) 31.0-31.9, adult: Secondary | ICD-10-CM

## 2018-12-11 DIAGNOSIS — I2782 Chronic pulmonary embolism: Secondary | ICD-10-CM | POA: Diagnosis not present

## 2018-12-11 DIAGNOSIS — M109 Gout, unspecified: Secondary | ICD-10-CM | POA: Diagnosis present

## 2018-12-11 DIAGNOSIS — I130 Hypertensive heart and chronic kidney disease with heart failure and stage 1 through stage 4 chronic kidney disease, or unspecified chronic kidney disease: Secondary | ICD-10-CM | POA: Diagnosis not present

## 2018-12-11 DIAGNOSIS — Z8249 Family history of ischemic heart disease and other diseases of the circulatory system: Secondary | ICD-10-CM

## 2018-12-11 DIAGNOSIS — Z5320 Procedure and treatment not carried out because of patient's decision for unspecified reasons: Secondary | ICD-10-CM

## 2018-12-11 DIAGNOSIS — Z91013 Allergy to seafood: Secondary | ICD-10-CM | POA: Diagnosis not present

## 2018-12-11 DIAGNOSIS — Z961 Presence of intraocular lens: Secondary | ICD-10-CM | POA: Diagnosis present

## 2018-12-11 DIAGNOSIS — Z79890 Hormone replacement therapy (postmenopausal): Secondary | ICD-10-CM

## 2018-12-11 DIAGNOSIS — I824Z1 Acute embolism and thrombosis of unspecified deep veins of right distal lower extremity: Secondary | ICD-10-CM | POA: Diagnosis present

## 2018-12-11 DIAGNOSIS — Z91018 Allergy to other foods: Secondary | ICD-10-CM

## 2018-12-11 DIAGNOSIS — M549 Dorsalgia, unspecified: Secondary | ICD-10-CM | POA: Diagnosis not present

## 2018-12-11 DIAGNOSIS — I440 Atrioventricular block, first degree: Secondary | ICD-10-CM | POA: Diagnosis present

## 2018-12-11 DIAGNOSIS — Z90710 Acquired absence of both cervix and uterus: Secondary | ICD-10-CM

## 2018-12-11 DIAGNOSIS — M0609 Rheumatoid arthritis without rheumatoid factor, multiple sites: Secondary | ICD-10-CM

## 2018-12-11 DIAGNOSIS — Z01818 Encounter for other preprocedural examination: Secondary | ICD-10-CM | POA: Diagnosis not present

## 2018-12-11 DIAGNOSIS — Z79891 Long term (current) use of opiate analgesic: Secondary | ICD-10-CM

## 2018-12-11 DIAGNOSIS — E785 Hyperlipidemia, unspecified: Secondary | ICD-10-CM | POA: Diagnosis not present

## 2018-12-11 DIAGNOSIS — M069 Rheumatoid arthritis, unspecified: Secondary | ICD-10-CM | POA: Diagnosis present

## 2018-12-11 DIAGNOSIS — I10 Essential (primary) hypertension: Secondary | ICD-10-CM | POA: Diagnosis not present

## 2018-12-11 DIAGNOSIS — M064 Inflammatory polyarthropathy: Secondary | ICD-10-CM | POA: Diagnosis not present

## 2018-12-11 DIAGNOSIS — E1169 Type 2 diabetes mellitus with other specified complication: Secondary | ICD-10-CM | POA: Diagnosis not present

## 2018-12-11 DIAGNOSIS — G92 Toxic encephalopathy: Secondary | ICD-10-CM | POA: Diagnosis not present

## 2018-12-11 DIAGNOSIS — Z86711 Personal history of pulmonary embolism: Secondary | ICD-10-CM | POA: Diagnosis not present

## 2018-12-11 DIAGNOSIS — E1122 Type 2 diabetes mellitus with diabetic chronic kidney disease: Secondary | ICD-10-CM | POA: Diagnosis not present

## 2018-12-11 DIAGNOSIS — Z79899 Other long term (current) drug therapy: Secondary | ICD-10-CM

## 2018-12-11 DIAGNOSIS — R5081 Fever presenting with conditions classified elsewhere: Secondary | ICD-10-CM | POA: Diagnosis not present

## 2018-12-11 DIAGNOSIS — F0390 Unspecified dementia without behavioral disturbance: Secondary | ICD-10-CM | POA: Diagnosis present

## 2018-12-11 DIAGNOSIS — E669 Obesity, unspecified: Secondary | ICD-10-CM | POA: Diagnosis present

## 2018-12-11 DIAGNOSIS — I2510 Atherosclerotic heart disease of native coronary artery without angina pectoris: Secondary | ICD-10-CM | POA: Diagnosis not present

## 2018-12-11 DIAGNOSIS — Z86718 Personal history of other venous thrombosis and embolism: Secondary | ICD-10-CM

## 2018-12-11 DIAGNOSIS — D509 Iron deficiency anemia, unspecified: Secondary | ICD-10-CM | POA: Diagnosis present

## 2018-12-11 DIAGNOSIS — R000 Tachycardia, unspecified: Secondary | ICD-10-CM | POA: Diagnosis not present

## 2018-12-11 DIAGNOSIS — Z9049 Acquired absence of other specified parts of digestive tract: Secondary | ICD-10-CM

## 2018-12-11 DIAGNOSIS — R509 Fever, unspecified: Secondary | ICD-10-CM | POA: Diagnosis not present

## 2018-12-11 DIAGNOSIS — Z96653 Presence of artificial knee joint, bilateral: Secondary | ICD-10-CM | POA: Diagnosis present

## 2018-12-11 DIAGNOSIS — M0689 Other specified rheumatoid arthritis, multiple sites: Secondary | ICD-10-CM | POA: Diagnosis not present

## 2018-12-11 DIAGNOSIS — I152 Hypertension secondary to endocrine disorders: Secondary | ICD-10-CM | POA: Diagnosis not present

## 2018-12-11 DIAGNOSIS — Z7901 Long term (current) use of anticoagulants: Secondary | ICD-10-CM

## 2018-12-11 DIAGNOSIS — Z9842 Cataract extraction status, left eye: Secondary | ICD-10-CM

## 2018-12-11 DIAGNOSIS — M4807 Spinal stenosis, lumbosacral region: Secondary | ICD-10-CM | POA: Diagnosis present

## 2018-12-11 DIAGNOSIS — Z881 Allergy status to other antibiotic agents status: Secondary | ICD-10-CM

## 2018-12-11 DIAGNOSIS — H1013 Acute atopic conjunctivitis, bilateral: Secondary | ICD-10-CM

## 2018-12-11 DIAGNOSIS — M0579 Rheumatoid arthritis with rheumatoid factor of multiple sites without organ or systems involvement: Secondary | ICD-10-CM | POA: Diagnosis not present

## 2018-12-11 DIAGNOSIS — M48061 Spinal stenosis, lumbar region without neurogenic claudication: Secondary | ICD-10-CM | POA: Diagnosis not present

## 2018-12-11 DIAGNOSIS — Z9841 Cataract extraction status, right eye: Secondary | ICD-10-CM

## 2018-12-11 DIAGNOSIS — I825Z1 Chronic embolism and thrombosis of unspecified deep veins of right distal lower extremity: Secondary | ICD-10-CM | POA: Diagnosis not present

## 2018-12-11 DIAGNOSIS — L02413 Cutaneous abscess of right upper limb: Secondary | ICD-10-CM | POA: Diagnosis not present

## 2018-12-11 DIAGNOSIS — Z5329 Procedure and treatment not carried out because of patient's decision for other reasons: Secondary | ICD-10-CM

## 2018-12-11 DIAGNOSIS — I2609 Other pulmonary embolism with acute cor pulmonale: Secondary | ICD-10-CM | POA: Diagnosis not present

## 2018-12-11 DIAGNOSIS — R531 Weakness: Secondary | ICD-10-CM | POA: Diagnosis not present

## 2018-12-11 DIAGNOSIS — E1129 Type 2 diabetes mellitus with other diabetic kidney complication: Secondary | ICD-10-CM | POA: Diagnosis present

## 2018-12-11 LAB — URINALYSIS, ROUTINE W REFLEX MICROSCOPIC
Bacteria, UA: NONE SEEN
Bilirubin Urine: NEGATIVE
Glucose, UA: NEGATIVE mg/dL
Hgb urine dipstick: NEGATIVE
Ketones, ur: NEGATIVE mg/dL
Nitrite: NEGATIVE
Protein, ur: NEGATIVE mg/dL
Specific Gravity, Urine: 1.011 (ref 1.005–1.030)
pH: 7 (ref 5.0–8.0)

## 2018-12-11 LAB — CBC WITH DIFFERENTIAL/PLATELET
Abs Immature Granulocytes: 0.02 10*3/uL (ref 0.00–0.07)
Basophils Absolute: 0.1 10*3/uL (ref 0.0–0.1)
Basophils Relative: 1 %
Eosinophils Absolute: 0.3 10*3/uL (ref 0.0–0.5)
Eosinophils Relative: 4 %
HCT: 29 % — ABNORMAL LOW (ref 36.0–46.0)
Hemoglobin: 9.2 g/dL — ABNORMAL LOW (ref 12.0–15.0)
Immature Granulocytes: 0 %
Lymphocytes Relative: 27 %
Lymphs Abs: 1.6 10*3/uL (ref 0.7–4.0)
MCH: 31.3 pg (ref 26.0–34.0)
MCHC: 31.7 g/dL (ref 30.0–36.0)
MCV: 98.6 fL (ref 80.0–100.0)
Monocytes Absolute: 0.6 10*3/uL (ref 0.1–1.0)
Monocytes Relative: 9 %
Neutro Abs: 3.6 10*3/uL (ref 1.7–7.7)
Neutrophils Relative %: 59 %
Platelets: 298 10*3/uL (ref 150–400)
RBC: 2.94 MIL/uL — ABNORMAL LOW (ref 3.87–5.11)
RDW: 16.6 % — ABNORMAL HIGH (ref 11.5–15.5)
WBC: 6.1 10*3/uL (ref 4.0–10.5)
nRBC: 0 % (ref 0.0–0.2)

## 2018-12-11 LAB — COMPREHENSIVE METABOLIC PANEL
ALT: 7 U/L (ref 0–44)
AST: 24 U/L (ref 15–41)
Albumin: 3.1 g/dL — ABNORMAL LOW (ref 3.5–5.0)
Alkaline Phosphatase: 42 U/L (ref 38–126)
Anion gap: 8 (ref 5–15)
BUN: 12 mg/dL (ref 8–23)
CO2: 25 mmol/L (ref 22–32)
Calcium: 8.4 mg/dL — ABNORMAL LOW (ref 8.9–10.3)
Chloride: 103 mmol/L (ref 98–111)
Creatinine, Ser: 0.98 mg/dL (ref 0.44–1.00)
GFR calc Af Amer: >60 mL/min (ref >60)
GFR calc non Af Amer: 52 mL/min — ABNORMAL LOW (ref >60)
Glucose, Bld: 99 mg/dL (ref 70–99)
Potassium: 3.7 mmol/L (ref 3.5–5.1)
Sodium: 136 mmol/L (ref 135–145)
Total Bilirubin: 0.9 mg/dL (ref 0.3–1.2)
Total Protein: 6.1 g/dL — ABNORMAL LOW (ref 6.5–8.1)

## 2018-12-11 LAB — CBG MONITORING, ED: Glucose-Capillary: 89 mg/dL (ref 70–99)

## 2018-12-11 MED ORDER — SACUBITRIL-VALSARTAN 49-51 MG PO TABS
1.0000 | ORAL_TABLET | Freq: Every day | ORAL | Status: DC
  Administered 2018-12-11 – 2018-12-20 (×10): 1 via ORAL
  Filled 2018-12-11 (×11): qty 1

## 2018-12-11 MED ORDER — ONDANSETRON HCL 4 MG/2ML IJ SOLN
4.0000 mg | Freq: Once | INTRAMUSCULAR | Status: AC
  Administered 2018-12-11: 4 mg via INTRAVENOUS
  Filled 2018-12-11: qty 2

## 2018-12-11 MED ORDER — HYDRALAZINE HCL 50 MG PO TABS
100.0000 mg | ORAL_TABLET | Freq: Three times a day (TID) | ORAL | Status: DC
  Administered 2018-12-11 – 2018-12-20 (×27): 100 mg via ORAL
  Filled 2018-12-11 (×30): qty 2

## 2018-12-11 MED ORDER — FUROSEMIDE 20 MG PO TABS
20.0000 mg | ORAL_TABLET | Freq: Every day | ORAL | Status: DC
  Administered 2018-12-11 – 2018-12-20 (×10): 20 mg via ORAL
  Filled 2018-12-11 (×11): qty 1

## 2018-12-11 MED ORDER — HYDROCODONE-ACETAMINOPHEN 5-325 MG PO TABS
1.0000 | ORAL_TABLET | Freq: Four times a day (QID) | ORAL | Status: DC
  Administered 2018-12-11 – 2018-12-20 (×32): 1 via ORAL
  Filled 2018-12-11 (×32): qty 1

## 2018-12-11 MED ORDER — DICLOFENAC SODIUM 1 % TD GEL
2.0000 g | Freq: Every day | TRANSDERMAL | Status: DC | PRN
  Administered 2018-12-13 – 2018-12-19 (×3): 2 g via TOPICAL
  Filled 2018-12-11 (×4): qty 100

## 2018-12-11 MED ORDER — VITAMIN C 500 MG PO TABS
500.0000 mg | ORAL_TABLET | Freq: Every day | ORAL | Status: DC
  Administered 2018-12-11 – 2018-12-20 (×10): 500 mg via ORAL
  Filled 2018-12-11 (×11): qty 1

## 2018-12-11 MED ORDER — ALLOPURINOL 100 MG PO TABS
100.0000 mg | ORAL_TABLET | Freq: Every day | ORAL | Status: DC
  Filled 2018-12-11: qty 1

## 2018-12-11 MED ORDER — HYDROXYCHLOROQUINE SULFATE 200 MG PO TABS
200.0000 mg | ORAL_TABLET | Freq: Two times a day (BID) | ORAL | Status: DC
  Administered 2018-12-12 – 2018-12-20 (×18): 200 mg via ORAL
  Filled 2018-12-11 (×19): qty 1

## 2018-12-11 MED ORDER — LEFLUNOMIDE 20 MG PO TABS
20.0000 mg | ORAL_TABLET | Freq: Every day | ORAL | Status: DC
  Administered 2018-12-11 – 2018-12-13 (×3): 20 mg via ORAL
  Filled 2018-12-11 (×5): qty 1

## 2018-12-11 MED ORDER — MELATONIN 3 MG PO TABS
1.0000 | ORAL_TABLET | Freq: Every day | ORAL | Status: DC
  Administered 2018-12-12 – 2018-12-19 (×8): 3 mg via ORAL
  Filled 2018-12-11 (×11): qty 1

## 2018-12-11 MED ORDER — ALBUTEROL SULFATE HFA 108 (90 BASE) MCG/ACT IN AERS
1.0000 | INHALATION_SPRAY | Freq: Four times a day (QID) | RESPIRATORY_TRACT | Status: DC | PRN
  Filled 2018-12-11: qty 6.7

## 2018-12-11 MED ORDER — OLOPATADINE HCL 0.1 % OP SOLN
1.0000 [drp] | Freq: Two times a day (BID) | OPHTHALMIC | Status: DC
  Administered 2018-12-12 – 2018-12-20 (×14): 1 [drp] via OPHTHALMIC
  Filled 2018-12-11 (×2): qty 5

## 2018-12-11 MED ORDER — POLYVINYL ALCOHOL 1.4 % OP SOLN
1.0000 [drp] | Freq: Every day | OPHTHALMIC | Status: DC
  Administered 2018-12-13 – 2018-12-20 (×8): 1 [drp] via OPHTHALMIC
  Filled 2018-12-11 (×2): qty 15

## 2018-12-11 MED ORDER — VITAMIN D 25 MCG (1000 UNIT) PO TABS
1000.0000 [IU] | ORAL_TABLET | Freq: Every day | ORAL | Status: DC
  Administered 2018-12-12 – 2018-12-20 (×9): 1000 [IU] via ORAL
  Filled 2018-12-11 (×10): qty 1

## 2018-12-11 MED ORDER — CARVEDILOL 25 MG PO TABS
25.0000 mg | ORAL_TABLET | Freq: Two times a day (BID) | ORAL | Status: DC
  Administered 2018-12-11 – 2018-12-20 (×17): 25 mg via ORAL
  Filled 2018-12-11 (×19): qty 1

## 2018-12-11 MED ORDER — RIVAROXABAN 20 MG PO TABS
20.0000 mg | ORAL_TABLET | Freq: Every day | ORAL | Status: DC
  Administered 2018-12-11 – 2018-12-13 (×3): 20 mg via ORAL
  Filled 2018-12-11 (×4): qty 1

## 2018-12-11 MED ORDER — HYPROMELLOSE (GONIOSCOPIC) 2.5 % OP SOLN: 1.0000 [drp] | Freq: Every day | OPHTHALMIC | Status: DC

## 2018-12-11 MED ORDER — OLOPATADINE HCL 0.1 % OP SOLN
1.0000 [drp] | Freq: Two times a day (BID) | OPHTHALMIC | Status: DC
  Filled 2018-12-11: qty 5

## 2018-12-11 MED ORDER — ENSURE ENLIVE PO LIQD
237.0000 mL | Freq: Every evening | ORAL | Status: DC
  Administered 2018-12-11 – 2018-12-19 (×7): 237 mL via ORAL
  Filled 2018-12-11 (×3): qty 237

## 2018-12-11 MED ORDER — TRIAMCINOLONE ACETONIDE 0.1 % EX CREA
1.0000 "application " | TOPICAL_CREAM | Freq: Two times a day (BID) | CUTANEOUS | Status: DC | PRN
  Filled 2018-12-11: qty 15

## 2018-12-11 MED ORDER — SODIUM CHLORIDE 0.9 % IV BOLUS
500.0000 mL | Freq: Once | INTRAVENOUS | Status: AC
  Administered 2018-12-11: 500 mL via INTRAVENOUS

## 2018-12-11 MED ORDER — GABAPENTIN 400 MG PO CAPS
400.0000 mg | ORAL_CAPSULE | Freq: Three times a day (TID) | ORAL | Status: DC
  Administered 2018-12-11 – 2018-12-20 (×27): 400 mg via ORAL
  Filled 2018-12-11 (×27): qty 1

## 2018-12-11 MED ORDER — ISOSORBIDE MONONITRATE ER 30 MG PO TB24
30.0000 mg | ORAL_TABLET | Freq: Every day | ORAL | Status: DC
  Administered 2018-12-12 – 2018-12-19 (×9): 30 mg via ORAL
  Filled 2018-12-11 (×9): qty 1

## 2018-12-11 NOTE — Progress Notes (Signed)
CSW met with pt and her daughter, Gilmore Laroche, who provided most information.  Pt discharged from [facility] yesterday.  Home health through [facility].  Personal care aid in place for 3 hours in AM and 3 hours in evening.  Pt was too weak to get out of the car, too weak to move from the bed to the bedside commode.  Daughter stayed there all day and night yesterday and even with the aid they cannot move pt around adequately.  CSW spoke with Ashely from [employer].  They would need a new authorization but willing to work towards her returning.  Need new PT evaluation. OT would be nice, too.  Could potentially have authorization by tomorrow afternoon if evaluation was available in the AM.  CSW spoke with Dr Eulis Foster, Gabriel Cirri, who is in agreement with this plan and will order the evaluations.  CSW updated pt and daughter. Winferd Humphrey, MSW, LCSW Clinical Social Worker 12/11/2018 3:38 PM

## 2018-12-11 NOTE — ED Provider Notes (Signed)
[facility]-EMERGENCY DEPT Provider Note   CSN: [id number] Arrival date & time: 12/11/18  5621     History   Chief Complaint Chief Complaint  Patient presents with  . Weakness    HPI [name] [name] is a 85 y.o. female.  HPI  Patient is here for evaluation of weakness, which prevents her from managing herself at home.  She was discharged from rehab yesterday where she had been staying because of low back pain with spinal stenosis.  She is due to see cardiology for clearance for back surgery to treat spinal stenosis.  Her daughter is here with her.  She states that she had to spend the night with her mother last night.  Apparently home health services with an aide was set up, for daily assistance.  She does not have 24/7 coverage.  There is no reported fever, chills, vomiting or dizziness.  Patient feels weak in her right arm and right leg.  She also has weakness in her left leg.  There are no other known modifying factors.  Past Medical History:  Diagnosis Date  . Allergic rhinitis due to pollen 04/27/2007  . Arthritis    "in q joint" (08/07/2013)  . CHF (congestive heart failure) (HCC)   . Coronary atherosclerosis of native coronary artery   . Cough   . Disorder of bone and cartilage, unspecified   . Edema 05/03/2013  . Exertional shortness of breath    "sometimes" (08/07/2013)  . First degree atrioventricular block   . Gout, unspecified 04/19/2013  . Hyperlipidemia 04/27/2007  . Hypertension   . Midline low back pain without sciatica 06/27/2014  . Muscle spasm   . Muscle weakness (generalized)   . Myocardial infarction (HCC) ~ 1970  . Onychia and paronychia of toe   . Osteoarthrosis, unspecified whether generalized or localized, unspecified site 04/27/2007  . Other fall   . Other malaise and fatigue   . Pneumonia 05/2018  . Prepatellar bursitis   . Pulmonary embolism (HCC) 07/2018  . Rheumatic nodule 06/28/2017  . Seizures (HCC)   . Stroke (HCC)  01/17/2014  . TIA (transient ischemic attack)    "a few one summer" (08/07/2013)  . Type II diabetes mellitus (HCC)    "fasting 90-110s" (08/07/2013)  . Unspecified essential hypertension 08/08/2013  . Unspecified vitamin D deficiency     Patient Active Problem List   Diagnosis Date Noted  . Hand swelling 12/02/2018  . Pain, wrist 12/02/2018  . Hand pain 12/02/2018  . Polyarthritis 12/02/2018  . Spinal stenosis, lumbar 11/19/2018  . Acute encephalopathy 11/19/2018  . History of CVA (cerebrovascular accident) 11/19/2018  . Acute gout 11/15/2018  . Low back pain 11/14/2018  . Chronic back pain 10/27/2018  . Arthralgia of both knees 10/27/2018  . Rheumatoid arthritis involving both knees (HCC) 10/27/2018  . Acute pain 10/21/2018  . Physical deconditioning 09/06/2018  . Hyperlipidemia associated with type 2 diabetes mellitus (HCC), refuses statin 09/06/2018  . Refusal of statin medication by patient 09/04/2018  . Deep vein thrombosis (DVT) of distal vein of right lower extremity (HCC) 07/30/2018  . Hypertensive heart disease with heart failure (HCC) 07/30/2018  . Pulmonary embolism (HCC), 07/2018, on Xarelto with goal to DC after 6 months if more active 07/10/2018  . DCM (dilated cardiomyopathy) (HCC)   . Benign hypertensive heart and kidney disease with CHF and stage 3 chronic kidney disease (HCC) 05/28/2018  . Osteoarthritis 05/06/2018  . Hypokalemia 02/02/2018  . Adhesive capsulitis of right  shoulder 10/12/2017  . Idiopathic chronic gout of multiple sites with tophus 08/25/2017  . History of total knee arthroplasty, bilateral 02/18/2017  . History of rotator cuff surgery 02/18/2017  . High risk medications, long-term use 09/21/2016  . Chronic combined systolic and diastolic CHF (congestive heart failure) (HCC) 09/12/2015  . Bilateral edema of lower extremity 06/05/2014  . CKD stage 3 due to type 2 diabetes mellitus (HCC) 06/05/2014  . DM (diabetes mellitus), type 2 with renal  complications (HCC) 06/05/2014    Class: Chronic  . Anemia of chronic disease 05/01/2014  . Rheumatoid arthritis involving multiple sites (HCC) 12/19/2013    Class: Chronic  . CAD (coronary artery disease) 10/18/2013  . Hypertension associated with diabetes (HCC) 08/08/2013    Class: Chronic  . Fibromyalgia 05/10/2013  . Type 2 diabetes mellitus with hyperlipidemia (HCC), patient declines statin 04/27/2007  . Allergic rhinitis 04/27/2007  . Diverticulosis 04/27/2007    Past Surgical History:  Procedure Laterality Date  . ABDOMINAL HYSTERECTOMY  04/1980  . APPENDECTOMY  1960  . CARDIAC CATHETERIZATION  2003  . CATARACT EXTRACTION W/ INTRAOCULAR LENS  IMPLANT, BILATERAL Bilateral ~ 2012  . CHOLECYSTECTOMY N/A 06/26/2018   Procedure: LAPAROSCOPIC CHOLECYSTECTOMY POSSIBLE INTRAOPERATIVE CHOLANGIOGRAM;  Surgeon: Abigail Miyamoto, MD;  Location: [facility] OR;  Service: General;  Laterality: N/A;  . JOINT REPLACEMENT    . REPLACEMENT TOTAL KNEE Right 09/2005  . RIGHT/LEFT HEART CATH AND CORONARY ANGIOGRAPHY N/A 06/01/2018   Procedure: RIGHT/LEFT HEART CATH AND CORONARY ANGIOGRAPHY;  Surgeon: Corky Crafts, MD;  Location: [facility] INVASIVE CV LAB;  Service: Cardiovascular;  Laterality: N/A;  . SHOULDER OPEN ROTATOR CUFF REPAIR Right 07/1999  . TONSILLECTOMY  08/1974  . TOTAL KNEE ARTHROPLASTY Left 08/07/2013   Procedure: TOTAL KNEE ARTHROPLASTY- LEFT;  Surgeon: Dannielle Huh, MD;  Location: [facility] OR;  Service: Orthopedics;  Laterality: Left;  . TRANSTHORACIC ECHOCARDIOGRAM  2003   EF 55-65%; mild concentric LVH     OB History   No obstetric history on file.      Home Medications    Prior to Admission medications   Medication Sig Start Date End Date Taking? Authorizing Provider  albuterol (PROVENTIL HFA;VENTOLIN HFA) 108 (90 Base) MCG/ACT inhaler Inhale 1-2 puffs into the lungs every 6 (six) hours as needed for wheezing or shortness of breath. 12/08/18  Yes Margit Hanks, MD  allopurinol  (ZYLOPRIM) 100 MG tablet Take 1 tablet (100 mg total) by mouth at bedtime. 12/08/18  Yes Margit Hanks, MD  carvedilol (COREG) 25 MG tablet Take 1 tablet (25 mg total) by mouth 2 (two) times daily with a meal. 12/08/18  Yes Margit Hanks, MD  D3-1000 25 MCG (1000 UT) capsule Take 1 capsule (1,000 Units total) by mouth daily. 12/08/18  Yes Margit Hanks, MD  diclofenac sodium (VOLTAREN) 1 % GEL Apply 2 g topically daily as needed (pain). 12/08/18  Yes Margit Hanks, MD  ENSURE (ENSURE) Take 237 mLs by mouth every evening.    Yes [provider]  furosemide (LASIX) 20 MG tablet Take 1 tablet (20 mg total) by mouth daily. 12/08/18  Yes Margit Hanks, MD  gabapentin (NEURONTIN) 400 MG capsule Take 1 capsule (400 mg total) by mouth 3 (three) times daily. 12/08/18  Yes Margit Hanks, MD  hydrALAZINE (APRESOLINE) 100 MG tablet Take 1 tablet (100 mg total) by mouth 3 (three) times daily. 12/08/18  Yes Margit Hanks, MD  HYDROcodone-acetaminophen (NORCO/VICODIN) 5-325 MG tablet Take 1 tablet by  mouth every 6 (six) hours. 12/08/18  Yes Margit Hanks, MD  hydroxychloroquine (PLAQUENIL) 200 MG tablet Take 1 tablet (200 mg total) by mouth 2 (two) times daily. 12/08/18  Yes Margit Hanks, MD  hydroxypropyl methylcellulose / hypromellose (ISOPTO TEARS / GONIOVISC) 2.5 % ophthalmic solution Place 1 drop into both eyes daily. 12/08/18  Yes Margit Hanks, MD  isosorbide mononitrate (IMDUR) 30 MG 24 hr tablet Take 1 tablet (30 mg total) by mouth at bedtime. 12/08/18  Yes Margit Hanks, MD  leflunomide (ARAVA) 20 MG tablet Take 1 tablet (20 mg total) by mouth daily. 12/08/18  Yes Margit Hanks, MD  Melatonin 3 MG TABS Take 1 tablet (3 mg total) by mouth at bedtime. 12/08/18  Yes Margit Hanks, MD  Olopatadine HCl 0.2 % SOLN Place 1 drop into both eyes daily. 12/08/18  Yes Margit Hanks, MD  rivaroxaban (XARELTO) 20 MG TABS tablet Take 1 tablet (20 mg total) by mouth  daily with supper. 12/08/18  Yes Margit Hanks, MD  sacubitril-valsartan (ENTRESTO) 49-51 MG Take 1 tablet by mouth daily at 12 noon. 12/08/18  Yes Margit Hanks, MD  triamcinolone cream (KENALOG) 0.1 % Apply 1 application topically 2 (two) times daily as needed (for itching- to affected sites). 12/08/18  Yes Margit Hanks, MD  vitamin C (ASCORBIC ACID) 500 MG tablet Take 1 tablet (500 mg total) by mouth daily. 12/08/18  Yes Margit Hanks, MD    Family History Family History  Problem Relation Age of Onset  . Heart attack Father     Social History Social History   Tobacco Use  . Smoking status: Never Smoker  . Smokeless tobacco: Never Used  Substance Use Topics  . Alcohol use: No    Alcohol/week: 0.0 standard drinks  . Drug use: No     Allergies   Clonidine derivatives; Fish allergy; Shellfish allergy; Doxycycline; Indomethacin; Lyrica [pregabalin]; Methyldopa; Morphine and related; Orange fruit [citrus]; Strawberry (diagnostic); Cetirizine hcl; Codeine; Levaquin [levofloxacin in d5w]; and Tomato   Review of Systems Review of Systems  All other systems reviewed and are negative.    Physical Exam Updated Vital Signs BP (!) 198/97   Pulse (!) 103   Temp 99.2 F (37.3 C)   Resp (!) 24   Ht  (1.626 m)   Wt 78 kg   SpO2 94%   BMI 29.52 kg/m   Physical Exam Vitals signs and nursing note reviewed.  Constitutional:      General: She is not in acute distress.    Appearance: She is well-developed. She is obese. She is not ill-appearing.     Comments: Elderly, frail  HENT:     Head: Normocephalic and atraumatic.     Right Ear: External ear normal.     Left Ear: External ear normal.     Nose: No congestion or rhinorrhea.     Mouth/Throat:     Mouth: Mucous membranes are moist.     Pharynx: No oropharyngeal exudate or posterior oropharyngeal erythema.  Eyes:     Conjunctiva/sclera: Conjunctivae normal.     Pupils: Pupils are equal, round, and  reactive to light.  Neck:     Musculoskeletal: Normal range of motion and neck supple.     Trachea: Phonation normal.  Cardiovascular:     Rate and Rhythm: Normal rate and regular rhythm.     Heart sounds: Normal heart sounds.  Pulmonary:     Effort: Pulmonary effort  is normal.     Breath sounds: Normal breath sounds.  Abdominal:     Palpations: Abdomen is soft.     Tenderness: There is no abdominal tenderness.  Musculoskeletal: Normal range of motion.        General: Swelling (Bilateral legs) present.     Comments: Deformity right dorsal medial wrist consistent with toyed arthritis.  Strength assessment: Symmetric arm strength, legs 1/5, bilaterally.  Skin:    General: Skin is warm and dry.  Neurological:     Mental Status: She is alert.     Cranial Nerves: No cranial nerve deficit.     Sensory: No sensory deficit.     Motor: No abnormal muscle tone.     Coordination: Coordination normal.  Psychiatric:        Mood and Affect: Mood normal.        Behavior: Behavior normal.      ED Treatments / Results  Labs (all labs ordered are listed, but only abnormal results are displayed) Labs Reviewed  COMPREHENSIVE METABOLIC PANEL - Abnormal; Notable for the following components:      Result Value   Calcium 8.4 (*)    Total Protein 6.1 (*)    Albumin 3.1 (*)    GFR calc non Af Amer 52 (*)    All other components within normal limits  CBC WITH DIFFERENTIAL/PLATELET - Abnormal; Notable for the following components:   RBC 2.94 (*)    Hemoglobin 9.2 (*)    HCT 29.0 (*)    RDW 16.6 (*)    All other components within normal limits  URINALYSIS, ROUTINE W REFLEX MICROSCOPIC - Abnormal; Notable for the following components:   Leukocytes, UA TRACE (*)    All other components within normal limits  CBG MONITORING, ED  CBG MONITORING, ED    EKG None  Radiology No results found.  Procedures Procedures (including critical care time)  Medications Ordered in ED Medications    sodium chloride 0.9 % bolus 500 mL ( Intravenous Stopped 12/11/18 1248)     Initial Impression / Assessment and Plan / ED Course  I have reviewed the triage vital signs and the nursing notes.  Pertinent labs & imaging results that were available during my care of the patient were reviewed by me and considered in my medical decision making (see chart for details).  Clinical Course as of Dec 11 1532  Sun Dec 11, 2018  1533 Normal  Urinalysis, Routine w reflex microscopic(!) [EW]  1534 Normal except calcium low, total protein low, albumin low  Comprehensive metabolic panel(!) [EW]  1534 Normal except hemoglobin low  CBC with Differential(!) [EW]    Clinical Course User Index [EW] Mancel BaleWentz, Hang Ammon, MD     Patient Vitals for the past 24 hrs:  BP Temp Pulse Resp SpO2 Height Weight  12/11/18 1400 (!) 198/97 - (!) 103 (!) 24 94 % - -  12/11/18 1158 (!) 198/106 - (!) 102 15 99 % - -  12/11/18 0954 (!) 203/95 99.2 F (37.3 C) (!) 103 17 97 % - -  12/11/18 [id number] - - - - - 5\' 4"  (1.626 m) 78 kg  12/11/18 0945 - - - - 96 % - -    2:23 PM Reevaluation with update and discussion. After initial assessment and treatment, an updated evaluation reveals she is currently comfortable and eating.  Findings discussed with patient's daughter and the patient.  The daughter is concerned that patient cannot be managed at home.  I explained  that she should be able to sit she just got out of rehab and had comprehensive evaluation and treatment, there.  Daughter would like to talk to Child psychotherapist and possibly case management for increasing care at home. Mancel Bale   Medical Decision Making: Toma Deiters, with spinal stenosis causing back pain.  Screening evaluation does not indicate need for hospitalization, at this time.  Social work has been consulted and have contacted her rehab facility, for which she was discharged yesterday.  They state that she may be able to be readmitted there, tomorrow.  They will require  a PT/OT evaluation to document need for additional services.  CRITICAL CARE-no Performed by: Mancel Bale  Nursing Notes Reviewed/ Care Coordinated Applicable Imaging Reviewed Interpretation of Laboratory Data incorporated into ED treatment   Plan-board patient in ED until she can be seen by PT/OT, and hopefully be placed in a rehab facility, tomorrow.  Final Clinical Impressions(s) / ED Diagnoses   Final diagnoses:  Back pain, unspecified back location, unspecified back pain laterality, unspecified chronicity  Spinal stenosis of lumbosacral region     ED Discharge Orders    None       Mancel Bale, MD 12/11/18 1537

## 2018-12-11 NOTE — Telephone Encounter (Signed)
Patient in hospital.

## 2018-12-11 NOTE — ED Triage Notes (Signed)
Patient BIB [facility] from home, lives with daughter. C/O generalized weakness progressively worse  x1 week, pt just released for [facility], there x3 weeks for chronic pain. Dx with stenosis of spine. Since last night hasn't been able to stand or ambulated, denies n.v/d, fever.

## 2018-12-12 ENCOUNTER — Ambulatory Visit: Payer: Self-pay

## 2018-12-12 ENCOUNTER — Telehealth: Payer: Self-pay | Admitting: Internal Medicine

## 2018-12-12 MED ORDER — ALLOPURINOL 100 MG PO TABS
100.0000 mg | ORAL_TABLET | Freq: Every day | ORAL | Status: DC
  Administered 2018-12-13 – 2018-12-20 (×8): 100 mg via ORAL
  Filled 2018-12-12 (×8): qty 1

## 2018-12-12 MED ORDER — HYDROCODONE-ACETAMINOPHEN 5-325 MG PO TABS
1.0000 | ORAL_TABLET | Freq: Once | ORAL | Status: AC
  Administered 2018-12-12: 1 via ORAL
  Filled 2018-12-12: qty 1

## 2018-12-12 NOTE — Progress Notes (Signed)
CSW received handoff from Weekend CSW stating patient was recently discharged from [facility] on 2/1. Per notes, patient brought here due to patient's increased weakness at home and inability to stand or ambulate. Per Weekend CSW, CSW reached out to [facility] regarding patient return to facility. Per notes, [facility] SNF would start authorization once PT recommendation was in. CSW informed by Lowella Bandy, Admissions at [facility], that they have begun [facility] authorization and patient has 22 days left. CSW will continue to follow.  Archie Balboa, LCSWA  Clinical Social Work Department  [employer]  [phone]

## 2018-12-12 NOTE — Telephone Encounter (Signed)
7:10pm: TC and voice message left with daughter [name]. This was a return TC as Para March had left a message with our triage nurse (see below). I left Jeanette my contact information.   "I (triage nurse) have received a call from [facility] for this patient on today.  She tells me that she would like the palliative team to be aware and update them regarding an increase in  pain and change in weight bearing status.  She is awaiting a call back from the patients PCP on today.  Patient was discharged from rehab on 12/10/18 and today she is unable to manage care and care needs.  She discussed possibly having her return to the rehab center??  She would like for the palliative team to contact her on 12/12/18 to follow up."    Holly Bodily NP-C

## 2018-12-12 NOTE — Discharge Summary (Signed)
EDCM consulted to increase home health services.  EDCM will contact home health provider to increase services.  No further EDCM needs identified at this time.

## 2018-12-12 NOTE — Evaluation (Signed)
Physical Therapy Evaluation Patient Details Name: [name] [name] MRN: [id number] DOB: October 26, 1930 Today's Date: 12/12/2018   History of Present Illness  [name] [name] is a 85 y.o. female with history of rheumatoid arthritis, gout, diabetes mellitus type 2, diastolic CHF, PE on Xarelto, history of CVA, iron deficiency, hypertension, nonobstructive CAD was recently admitted last month for low back pain at the time MRI of the lumbar spine showed degenerative disc disease and patient was eventually discharged to rehab , DC from [facility] 12/10/18. Per family, patient unable to ambulate after DC.   Clinical Impression  PT noted the  patient feeling  very warm to touch. Temp recorded at 101.3. RN notified. [name] HR 115, Sats  Noted to drop to 88%, noted decreased breaths, slightly apneic. Improves with stimulation. The patient is very lethargic, speech is slurred, decreased effort. The patient's  extremities are low tone , as well as trunk when sitting and requires max assist for balance.Patient remains very lethargic.  Patient in [facility] for back pain, being evaluated for back surgery, was DC from [facility] 11/17/18  To SNF. Per Daughter present in room,  reports that patient was ambulating with PT with RW at SNF. On day of DC,12/10/18, patient required extensive assist to get into and out of  Car. Patient has been lethargic and unable to mobilize .Therfore, EMS called as patient was unable to care for self.  Patient clearly has had a change in functional and mental status by report.  Pt admitted with above diagnosis. Pt currently with functional limitations due to the deficits listed below (see PT Problem List).  Pt will benefit from skilled PT to increase their independence and safety with mobility to allow discharge to the venue listed below.     Follow Up Recommendations SNF    Equipment Recommendations  None recommended by PT    Recommendations for Other Services       Precautions / Restrictions  Precautions Precautions: Fall Precaution Comments: generalized pain in joints      Mobility  Bed Mobility Overal bed mobility: Needs Assistance Bed Mobility: Rolling;Sidelying to Sit;Sit to Supine Rolling: Total assist Sidelying to sit: Total assist   Sit to supine: Total assist   General bed mobility comments: multimodal cues to roll, max assist to reach for rail.  Total assist to sit at bed edge and t get legs onto bed and lie back down  Transfers                 General transfer comment: unable  Ambulation/Gait                Stairs            Wheelchair Mobility    Modified Rankin (Stroke Patients Only)       Balance Overall balance assessment: Needs assistance Sitting-balance support: Feet unsupported;No upper extremity supported Sitting balance-Leahy Scale: Zero Sitting balance - Comments: patient does not arouse to offer self support                                      Pertinent Vitals/Pain Pain Assessment: Faces Faces Pain Scale: Hurts even more Pain Location: shoulders, back, left wrist Pain Descriptors / Indicators: Discomfort;Grimacing;Guarding Pain Intervention(s): Monitored during session    Home Living Family/patient expects to be discharged to:: Private residence Living Arrangements: Alone Available Help at Discharge: Personal care attendant;Available PRN/intermittently Type of Home: Apartment  Home Access: Level entry     Home Layout: One level Home Equipment: Hand held shower head;Walker - 4 wheels;Walker - 2 wheels;Cane - single point;Tub bench;Wheelchair - manual;Grab bars - toilet;Bedside commode Additional Comments: was to get an aide several hours /day at DC from SNF- info from previous encounter    Prior Function Level of Independence: Needs assistance   Gait / Transfers Assistance Needed: per daughter, ambulating with RW with PT up until day of DC from SNF.           Hand Dominance         Extremity/Trunk Assessment   Upper Extremity Assessment Upper Extremity Assessment: RUE deficits/detail;LUE deficits/detail RUE Deficits / Details: drops  arm when placed in flexion, cannot reach mouth.  LUE Deficits / Details: same as right, drops when placed  in elevation.    Lower Extremity Assessment Lower Extremity Assessment: RLE deficits/detail;Generalized weakness;LLE deficits/detail RLE Deficits / Details: did flex  hip and knee partially when supine LLE Deficits / Details: same as right    Cervical / Trunk Assessment Cervical / Trunk Assessment: Other exceptions Cervical / Trunk Exceptions: no control of sitting balance, at times does attempt to balance trunk briefly, but arousal waxes and  wanes  Communication      Cognition Arousal/Alertness: Lethargic   Overall Cognitive Status: Impaired/Different from baseline Area of Impairment: Orientation;Following commands;Awareness                       Following Commands: Follows one step commands inconsistently;Follows one step commands with increased time       General Comments: patient essentially lethargic, arouses to verbal and tactile stimulation but does not stay aroused. Patients speech is slurred. Oriented to [facility]. Arouses briefly, then returns to non responsiveness.      General Comments      Exercises     Assessment/Plan    PT Assessment Patient needs continued PT services  PT Problem List Decreased strength;Cardiopulmonary status limiting activity;Decreased cognition;Decreased activity tolerance;Decreased knowledge of use of DME;Impaired tone;Decreased balance;Decreased safety awareness;Decreased mobility;Decreased knowledge of precautions;Pain       PT Treatment Interventions DME instruction;Gait training;Balance training;Therapeutic exercise;Functional mobility training;Therapeutic activities;Patient/family education;Cognitive remediation    PT Goals (Current goals can be found in the Care Plan  section)  Acute Rehab PT Goals Patient Stated Goal: per daughter , to wake up and walk PT Goal Formulation: With family Time For Goal Achievement: 12/26/18 Potential to Achieve Goals: Fair    Frequency Min 2X/week   Barriers to discharge Decreased caregiver support      Co-evaluation               AM-PAC PT "6 Clicks" Mobility  Outcome Measure Help needed turning from your back to your side while in a flat bed without using bedrails?: Total Help needed moving from lying on your back to sitting on the side of a flat bed without using bedrails?: Total Help needed moving to and from a bed to a chair (including a wheelchair)?: Total Help needed standing up from a chair using your arms (e.g., wheelchair or bedside chair)?: Total Help needed to walk in hospital room?: Total Help needed climbing 3-5 steps with a railing? : Total 6 Click Score: 6    End of Session   Activity Tolerance: Patient limited by lethargy Patient left: in bed;with call bell/phone within reach;with family/visitor present Nurse Communication: Mobility status(patient has a temp of 101.3) PT Visit Diagnosis: Other abnormalities of gait  and mobility (R26.89);Muscle weakness (generalized) (M62.81);Pain Pain - Right/Left: Right Pain - part of body: Shoulder    Time: [id number] PT Time Calculation (min) (ACUTE ONLY): 30 min   Charges:   PT Evaluation $PT Eval Low Complexity: 1 MOD PT Treatments $Self Care/Home Management: 8-22        Blanchard KelchKaren Lizzete Gough PT Acute Rehabilitation Services Pager [phone] Office [phone]   Rada HayHill, Conrado Nance Elizabeth 12/12/2018, 9:13 AM

## 2018-12-12 NOTE — ED Notes (Signed)
Primary nurse verbalizes medications have not been received/available from pharmacy. This Clinical research associate called pharmacy to request all morning medication not available in our ED pyxis. Per pharmacy will send. Primary nurse made aware.

## 2018-12-12 NOTE — ED Notes (Signed)
Pt laying in bed eyes closed equal chest rise and fall no acute distress.  

## 2018-12-12 NOTE — ED Notes (Signed)
[name] from PT informed of Pt temp of 101.3

## 2018-12-12 NOTE — ED Notes (Signed)
Pharmacy tech verbalizes will review medication list due to pt daughter stating that pt takes Prednisone. Primary nurse aware.

## 2018-12-13 ENCOUNTER — Telehealth: Payer: Self-pay | Admitting: Family Medicine

## 2018-12-13 ENCOUNTER — Emergency Department (HOSPITAL_COMMUNITY): Payer: Medicare Other

## 2018-12-13 ENCOUNTER — Observation Stay (HOSPITAL_COMMUNITY): Payer: Medicare Other

## 2018-12-13 ENCOUNTER — Encounter (HOSPITAL_COMMUNITY): Payer: Self-pay

## 2018-12-13 DIAGNOSIS — R509 Fever, unspecified: Secondary | ICD-10-CM | POA: Diagnosis present

## 2018-12-13 DIAGNOSIS — M4807 Spinal stenosis, lumbosacral region: Secondary | ICD-10-CM

## 2018-12-13 DIAGNOSIS — L03031 Cellulitis of right toe: Secondary | ICD-10-CM | POA: Diagnosis not present

## 2018-12-13 LAB — URINALYSIS, ROUTINE W REFLEX MICROSCOPIC
Bacteria, UA: NONE SEEN
Bilirubin Urine: NEGATIVE
Glucose, UA: NEGATIVE mg/dL
Hgb urine dipstick: NEGATIVE
Ketones, ur: NEGATIVE mg/dL
Nitrite: NEGATIVE
Protein, ur: NEGATIVE mg/dL
Specific Gravity, Urine: 1.013 (ref 1.005–1.030)
pH: 5 (ref 5.0–8.0)

## 2018-12-13 LAB — COMPREHENSIVE METABOLIC PANEL
ALT: 9 U/L (ref 0–44)
AST: 16 U/L (ref 15–41)
Albumin: 2.6 g/dL — ABNORMAL LOW (ref 3.5–5.0)
Alkaline Phosphatase: 36 U/L — ABNORMAL LOW (ref 38–126)
Anion gap: 8 (ref 5–15)
BUN: 13 mg/dL (ref 8–23)
CO2: 24 mmol/L (ref 22–32)
Calcium: 8.1 mg/dL — ABNORMAL LOW (ref 8.9–10.3)
Chloride: 102 mmol/L (ref 98–111)
Creatinine, Ser: 1.15 mg/dL — ABNORMAL HIGH (ref 0.44–1.00)
GFR calc Af Amer: 50 mL/min — ABNORMAL LOW (ref >60)
GFR calc non Af Amer: 43 mL/min — ABNORMAL LOW (ref >60)
Glucose, Bld: 109 mg/dL — ABNORMAL HIGH (ref 70–99)
Potassium: 3.5 mmol/L (ref 3.5–5.1)
Sodium: 134 mmol/L — ABNORMAL LOW (ref 135–145)
Total Bilirubin: 0.5 mg/dL (ref 0.3–1.2)
Total Protein: 5.5 g/dL — ABNORMAL LOW (ref 6.5–8.1)

## 2018-12-13 LAB — CBC
HCT: 27.5 % — ABNORMAL LOW (ref 36.0–46.0)
Hemoglobin: 8.5 g/dL — ABNORMAL LOW (ref 12.0–15.0)
MCH: 30.1 pg (ref 26.0–34.0)
MCHC: 30.9 g/dL (ref 30.0–36.0)
MCV: 97.5 fL (ref 80.0–100.0)
Platelets: 239 10*3/uL (ref 150–400)
RBC: 2.82 MIL/uL — ABNORMAL LOW (ref 3.87–5.11)
RDW: 16.8 % — ABNORMAL HIGH (ref 11.5–15.5)
WBC: 5.3 10*3/uL (ref 4.0–10.5)
nRBC: 0 % (ref 0.0–0.2)

## 2018-12-13 LAB — URIC ACID: Uric Acid, Serum: 5 mg/dL (ref 2.5–7.1)

## 2018-12-13 LAB — INFLUENZA PANEL BY PCR (TYPE A & B)
Influenza A By PCR: NEGATIVE
Influenza B By PCR: NEGATIVE

## 2018-12-13 LAB — SEDIMENTATION RATE: Sed Rate: 55 mm/hr — ABNORMAL HIGH (ref 0–22)

## 2018-12-13 MED ORDER — ALBUTEROL SULFATE (2.5 MG/3ML) 0.083% IN NEBU
3.0000 mL | INHALATION_SOLUTION | Freq: Four times a day (QID) | RESPIRATORY_TRACT | Status: DC | PRN
  Administered 2018-12-18: 3 mL via RESPIRATORY_TRACT
  Filled 2018-12-13: qty 3

## 2018-12-13 MED ORDER — PREDNISONE 20 MG PO TABS
40.0000 mg | ORAL_TABLET | Freq: Every day | ORAL | Status: DC
  Administered 2018-12-13 – 2018-12-14 (×2): 40 mg via ORAL
  Filled 2018-12-13 (×2): qty 2

## 2018-12-13 MED ORDER — SODIUM CHLORIDE 0.9 % IV SOLN
1.0000 g | Freq: Two times a day (BID) | INTRAVENOUS | Status: DC
  Administered 2018-12-13 – 2018-12-16 (×6): 1 g via INTRAVENOUS
  Filled 2018-12-13 (×9): qty 1

## 2018-12-13 MED ORDER — VANCOMYCIN HCL 10 G IV SOLR
1250.0000 mg | INTRAVENOUS | Status: DC
  Administered 2018-12-13 – 2018-12-15 (×2): 1250 mg via INTRAVENOUS
  Filled 2018-12-13 (×2): qty 1250

## 2018-12-13 MED ORDER — ACETAMINOPHEN 650 MG RE SUPP: 650.0000 mg | Freq: Four times a day (QID) | RECTAL | Status: DC | PRN

## 2018-12-13 MED ORDER — SODIUM CHLORIDE 0.9 % IV BOLUS
1000.0000 mL | Freq: Once | INTRAVENOUS | Status: AC
  Administered 2018-12-13: 1000 mL via INTRAVENOUS

## 2018-12-13 MED ORDER — ONDANSETRON HCL 4 MG PO TABS: 4.0000 mg | ORAL_TABLET | Freq: Four times a day (QID) | ORAL | Status: DC | PRN

## 2018-12-13 MED ORDER — ONDANSETRON HCL 4 MG/2ML IJ SOLN: 4.0000 mg | Freq: Four times a day (QID) | INTRAMUSCULAR | Status: DC | PRN

## 2018-12-13 MED ORDER — VANCOMYCIN HCL IN DEXTROSE 1-5 GM/200ML-% IV SOLN
1000.0000 mg | Freq: Once | INTRAVENOUS | Status: AC
  Administered 2018-12-13: 1000 mg via INTRAVENOUS
  Filled 2018-12-13 (×2): qty 200

## 2018-12-13 MED ORDER — ACETAMINOPHEN 325 MG PO TABS
650.0000 mg | ORAL_TABLET | Freq: Once | ORAL | Status: AC
  Administered 2018-12-13: 650 mg via ORAL
  Filled 2018-12-13: qty 2

## 2018-12-13 MED ORDER — DOCUSATE SODIUM 100 MG PO CAPS
100.0000 mg | ORAL_CAPSULE | Freq: Two times a day (BID) | ORAL | Status: DC
  Administered 2018-12-14 – 2018-12-20 (×9): 100 mg via ORAL
  Filled 2018-12-13 (×13): qty 1

## 2018-12-13 MED ORDER — GADOBUTROL 1 MMOL/ML IV SOLN
7.5000 mL | Freq: Once | INTRAVENOUS | Status: AC | PRN
  Administered 2018-12-13: 7.5 mL via INTRAVENOUS

## 2018-12-13 MED ORDER — SODIUM CHLORIDE 0.9 % IV SOLN
2.0000 g | Freq: Once | INTRAVENOUS | Status: AC
  Administered 2018-12-13: 2 g via INTRAVENOUS
  Filled 2018-12-13: qty 2

## 2018-12-13 MED ORDER — SENNA 8.6 MG PO TABS
1.0000 | ORAL_TABLET | Freq: Two times a day (BID) | ORAL | Status: DC
  Administered 2018-12-14 – 2018-12-20 (×9): 8.6 mg via ORAL
  Filled 2018-12-13 (×13): qty 1

## 2018-12-13 MED ORDER — ACETAMINOPHEN 325 MG PO TABS: 650.0000 mg | ORAL_TABLET | Freq: Four times a day (QID) | ORAL | Status: DC | PRN

## 2018-12-13 NOTE — ED Notes (Signed)
Pt is alert and oriented x 4 and is verbally responsive. Pt is c/o foot pain to bilateral heels, pt has some swelling noted to the rt hand.Pt dtr is at bedside.

## 2018-12-13 NOTE — ED Notes (Signed)
Pt dtr is at bedside  

## 2018-12-13 NOTE — ED Notes (Signed)
ED TO INPATIENT HANDOFF REPORT  Name/Age/Gender [name] [name] 85 y.o. female  Code Status Code Status History    Date Active Date Inactive Code Status Order ID Comments User Context   11/15/2018 0343 11/17/2018 2355 Full Code [id number]  Eduard Clos, MD ED   10/21/2018 2111 10/25/2018 1458 Full Code [id number]  Kathrynn Running, MD Inpatient   09/05/2018 1932 09/07/2018 2115 Full Code [id number]  John Giovanni, MD ED   07/24/2018 0208 07/27/2018 1929 Full Code [id number]  Briscoe Deutscher, MD ED   06/23/2018 0451 06/27/2018 2316 Full Code [id number]  Eduard Clos, MD Inpatient   05/28/2018 2345 06/04/2018 2356 Full Code [id number]  Hugelmeyer, [place], DO Inpatient   02/02/2018 0606 02/04/2018 1957 DNR [id number]  [name], MD ED   11/26/2017 2055 11/29/2017 1715 DNR [id number]  Zigmund Daniel., MD Inpatient   05/25/2017 1811 05/27/2017 1956 DNR [id number]  Leatha Gilding, MD Inpatient   11/29/2016 1522 12/01/2016 1509 Full Code [id number]  Haydee Salter, MD ED   09/11/2015 0454 09/12/2015 1432 Full Code [id number]  Hillary Bow, DO ED   06/19/2015 2338 06/21/2015 1941 Full Code [id number]  Hillary Bow, DO ED   01/25/2014 2245 06/19/2015 2338 Full Code [id number]  Margit Hanks, MD Outpatient   01/17/2014 1305 01/22/2014 2142 Full Code [id number]  Drema Dallas, MD ED    Advance Directive Documentation     Most Recent Value  Type of Advance Directive  Healthcare Power of Attorney, Living will  Pre-existing out of facility DNR order (yellow form or pink MOST form)  -  "MOST" Form in Place?  -      Home/SNF/Other Skilled nursing facility  Chief Complaint generalized weakness  Level of Care/Admitting Diagnosis ED Disposition    ED Disposition Condition Comment   Admit  Hospital Area: [facility] [[id number]]  Level of Care: Telemetry [5]  Admit to tele based on following criteria: Complex arrhythmia (Bradycardia/Tachycardia)  Diagnosis:  Fever [626948]  Admitting Physician: Alba Cory [id number]  Attending Physician: Hartley Barefoot A [[id number]]  PT Class (Do Not Modify): Observation [104]  PT Acc Code (Do Not Modify): Observation [10022]       Medical History Past Medical History:  Diagnosis Date  . Allergic rhinitis due to pollen 04/27/2007  . Arthritis    "in q joint" (08/07/2013)  . CHF (congestive heart failure) (HCC)   . Coronary atherosclerosis of native coronary artery   . Cough   . Disorder of bone and cartilage, unspecified   . Edema 05/03/2013  . Exertional shortness of breath    "sometimes" (08/07/2013)  . First degree atrioventricular block   . Gout, unspecified 04/19/2013  . Hyperlipidemia 04/27/2007  . Hypertension   . Midline low back pain without sciatica 06/27/2014  . Muscle spasm   . Muscle weakness (generalized)   . Myocardial infarction (HCC) ~ 1970  . Onychia and paronychia of toe   . Osteoarthrosis, unspecified whether generalized or localized, unspecified site 04/27/2007  . Other fall   . Other malaise and fatigue   . Pneumonia 05/2018  . Prepatellar bursitis   . Pulmonary embolism (HCC) 07/2018  . Rheumatic nodule 06/28/2017  . Seizures (HCC)   . Stroke (HCC) 01/17/2014  . TIA (transient ischemic attack)    "a few one summer" (08/07/2013)  . Type II diabetes mellitus (HCC)    "fasting 90-110s" (08/07/2013)  . Unspecified essential hypertension 08/08/2013  . Unspecified vitamin  D deficiency     Allergies Allergies  Allergen Reactions  . Clonidine Derivatives Swelling    [name]  . Fish Allergy Diarrhea, Swelling and Other (See Comments)    Turns skin "black," but can tolerate white fish Salmon- Diarrhea  . Shellfish Allergy Hives  . Doxycycline Rash  . Indomethacin Other (See Comments)    Reaction not recalled by the patient  . Lyrica [Pregabalin] Other (See Comments)    Hallucinations   .  Methyldopa Other (See Comments)    Aldomet (for hypertension): Reaction not recalled by the patient  . Morphine And Related Other (See Comments)    Family reports it drops her bp that she needs iv fluids  . Orange Fruit [Citrus] Other (See Comments)    Indigestion/heartburn  . Strawberry (Diagnostic) Itching  . Cetirizine Hcl Itching and Rash  . Codeine Itching  . Levaquin [Levofloxacin In D5w] Rash  . Tomato Rash    IV Location/Drains/Wounds Patient Lines/Drains/Airways Status   Active Line/Drains/Airways    Name:   Placement date:   Placement time:   Site:   Days:   Peripheral IV 12/11/18 Left Hand   12/11/18    1027    Hand   2          Labs/Imaging Results for orders placed or performed during the hospital encounter of 12/11/18 (from the past 48 hour(s))  CBC     Status: Abnormal   Collection Time: 12/13/18  8:45 AM  Result Value Ref Range   WBC 5.3 4.0 - 10.5 K/uL   RBC 2.82 (L) 3.87 - 5.11 MIL/uL   Hemoglobin 8.5 (L) 12.0 - 15.0 g/dL   HCT 73.4 (L) 19.3 - 79.0 %   MCV 97.5 80.0 - 100.0 fL   MCH 30.1 26.0 - 34.0 pg   MCHC 30.9 30.0 - 36.0 g/dL   RDW 24.0 (H) 97.3 - 53.2 %   Platelets 239 150 - 400 K/uL   nRBC 0.0 0.0 - 0.2 %    Comment: Performed at [facility], [address], [place], [place] [address]  Comprehensive metabolic panel     Status: Abnormal   Collection Time: 12/13/18  8:45 AM  Result Value Ref Range   Sodium 134 (L) 135 - 145 mmol/L   Potassium 3.5 3.5 - 5.1 mmol/L   Chloride 102 98 - 111 mmol/L   CO2 24 22 - 32 mmol/L   Glucose, Bld 109 (H) 70 - 99 mg/dL   BUN 13 8 - 23 mg/dL   Creatinine, Ser 6.83 (H) 0.44 - 1.00 mg/dL   Calcium 8.1 (L) 8.9 - 10.3 mg/dL   Total Protein 5.5 (L) 6.5 - 8.1 g/dL   Albumin 2.6 (L) 3.5 - 5.0 g/dL   AST 16 15 - 41 U/L   ALT 9 0 - 44 U/L   Alkaline Phosphatase 36 (L) 38 - 126 U/L   Total Bilirubin 0.5 0.3 - 1.2 mg/dL   GFR calc non Af Amer 43 (L) >60 mL/min   GFR calc Af Amer 50 (L) >60  mL/min   Anion gap 8 5 - 15    Comment: Performed at [facility], [address], [place], [place] [address]  Influenza panel by PCR (type A & B)     Status: None   Collection Time: 12/13/18  8:52 AM  Result Value Ref Range   Influenza A By PCR NEGATIVE NEGATIVE  Influenza B By PCR NEGATIVE NEGATIVE    Comment: (NOTE) The Xpert Xpress Flu assay is intended as an aid in the diagnosis of  influenza and should not be used as a sole basis for treatment.  This  assay is FDA approved for nasopharyngeal swab specimens only. Nasal  washings and aspirates are unacceptable for Xpert Xpress Flu testing. Performed at [facility], [address] [place], [place] [address]   Urinalysis, Routine w reflex microscopic     Status: Abnormal   Collection Time: 12/13/18 11:11 AM  Result Value Ref Range   Color, Urine YELLOW YELLOW   APPearance HAZY (A) CLEAR   Specific Gravity, Urine 1.013 1.005 - 1.030   pH 5.0 5.0 - 8.0   Glucose, UA NEGATIVE NEGATIVE mg/dL   Hgb urine dipstick NEGATIVE NEGATIVE   Bilirubin Urine NEGATIVE NEGATIVE   Ketones, ur NEGATIVE NEGATIVE mg/dL   Protein, ur NEGATIVE NEGATIVE mg/dL   Nitrite NEGATIVE NEGATIVE   Leukocytes, UA SMALL (A) NEGATIVE   RBC / HPF 0-5 0 - 5 RBC/hpf   WBC, UA 0-5 0 - 5 WBC/hpf   Bacteria, UA NONE SEEN NONE SEEN   Squamous Epithelial / LPF 11-20 0 - 5   Mucus PRESENT     Comment: Performed at [facility], [address], [place], [place] [address]   Dg Chest 2 View  Result Date: 12/13/2018 CLINICAL DATA:  Fever, hypoxia. EXAM: CHEST - 2 VIEW COMPARISON:  Radiograph of November 15, 2018 FINDINGS: Stable cardiomegaly. No pneumothorax or pleural effusion is noted. Both lungs are clear. The visualized skeletal structures are unremarkable. IMPRESSION: No active cardiopulmonary disease. Electronically Signed   By: Lupita Raider, [name]   On: 12/13/2018 09:51   None  Pending Labs Unresulted  Labs (From admission, onward)    Start     Ordered   12/13/18 1356  Sedimentation rate  Once,   R     12/13/18 1355   12/13/18 1356  Uric acid  Once,   R     12/13/18 1355   12/13/18 1356  Rheumatoid factor  Once,   R     12/13/18 1355   12/13/18 1037  Urine culture  ONCE - STAT,   STAT     12/13/18 1036   12/13/18 0812  Blood culture (routine x 2)  BLOOD CULTURE X 2,   STAT     12/13/18 6381   Signed and Held  Comprehensive metabolic panel  Tomorrow morning,   R     Signed and Held   Signed and Held  CBC  Tomorrow morning,   R     Signed and Held          Vitals/Pain Today's Vitals   12/13/18 1151 12/13/18 1332 12/13/18 1333 12/13/18 1334  BP: (!) 151/74     Pulse: 89     Resp: (!) 22     Temp: (!) 102.7 F (39.3 C) (!) 100.4 F (38 C)    TempSrc: Oral Oral    SpO2: 93%     Weight:      Height:      PainSc:   7  7     Isolation Precautions Droplet precaution  Medications Medications  albuterol (PROVENTIL HFA;VENTOLIN HFA) 108 (90 Base) MCG/ACT inhaler 1-2 puff (has no administration in time range)  carvedilol (COREG) tablet 25 mg (25 mg Oral Given 12/13/18 0858)  cholecalciferol (VITAMIN D3) tablet 1,000 Units (1,000 Units Oral Given  12/13/18 0900)  diclofenac sodium (VOLTAREN) 1 % transdermal gel 2 g (2 g Topical Given 12/13/18 1332)  feeding supplement (ENSURE ENLIVE) (ENSURE ENLIVE) liquid 237 mL (0 mLs Oral Hold 12/12/18 2058)  furosemide (LASIX) tablet 20 mg (20 mg Oral Given 12/13/18 0900)  gabapentin (NEURONTIN) capsule 400 mg (400 mg Oral Given 12/13/18 0900)  hydrALAZINE (APRESOLINE) tablet 100 mg (100 mg Oral Given 12/13/18 0900)  HYDROcodone-acetaminophen (NORCO/VICODIN) 5-325 MG per tablet 1 tablet (1 tablet Oral Given 12/13/18 0900)  hydroxychloroquine (PLAQUENIL) tablet 200 mg (200 mg Oral Given 12/13/18 0900)  isosorbide mononitrate (IMDUR) 24 hr tablet 30 mg (30 mg Oral Given 12/12/18 2120)  leflunomide (ARAVA) tablet 20 mg (20 mg Oral Given 12/13/18 0900)  Melatonin  TABS 3 mg (3 mg Oral Given 12/12/18 2120)  rivaroxaban (XARELTO) tablet 20 mg (20 mg Oral Given 12/12/18 1844)  sacubitril-valsartan (ENTRESTO) 49-51 mg per tablet (1 tablet Oral Given 12/13/18 1320)  vitamin C (ASCORBIC ACID) tablet 500 mg (500 mg Oral Given 12/13/18 0900)  triamcinolone cream (KENALOG) 0.1 % 1 application (has no administration in time range)  polyvinyl alcohol (LIQUIFILM TEARS) 1.4 % ophthalmic solution 1 drop (1 drop Both Eyes Not Given 12/13/18 0924)  olopatadine (PATANOL) 0.1 % ophthalmic solution 1 drop (1 drop Both Eyes Given 12/13/18 0900)  allopurinol (ZYLOPRIM) tablet 100 mg (100 mg Oral Given 12/13/18 0900)  vancomycin (VANCOCIN) IVPB 1000 mg/200 mL premix (has no administration in time range)  predniSONE (DELTASONE) tablet 40 mg (has no administration in time range)  sodium chloride 0.9 % bolus 500 mL ( Intravenous Stopped 12/11/18 1248)  ondansetron (ZOFRAN) injection 4 mg (4 mg Intravenous Given 12/11/18 1927)  HYDROcodone-acetaminophen (NORCO/VICODIN) 5-325 MG per tablet 1 tablet (1 tablet Oral Given 12/12/18 2317)  acetaminophen (TYLENOL) tablet 650 mg (650 mg Oral Given 12/13/18 1126)  sodium chloride 0.9 % bolus 1,000 mL (0 mLs Intravenous Stopped 12/13/18 0946)  ceFEPIme (MAXIPIME) 2 g in sodium chloride 0.9 % 100 mL IVPB (2 g Intravenous New Bag/Given 12/13/18 1321)    Mobility non-ambulatory

## 2018-12-13 NOTE — Telephone Encounter (Signed)
Caller states her mom was discharged from rehab yesterday. Her right side is almost paralyzed and is worsening. Her spine is messed up really bad. She is awaiting surgery but has to have clearance. Reports pain medicine is not helping her. Reports pain/ tingling numbness. Patient did go to the Hospital.  Per [facility]

## 2018-12-13 NOTE — Plan of Care (Signed)
Influenza A/B negative

## 2018-12-13 NOTE — Progress Notes (Addendum)
CSW still following for discharge planning. CSW aware [facility] started authorization on patient yesterday morning. CSW to follow up with Lowella Bandy, Admissions at [facility], regarding status of authorization. CSW will continue to follow.  9:25am- CSW received message from [facility] that they have not received authorization for patient at this time but will update CSW when they know.   Archie Balboa, LCSWA  Clinical Social Work Department  [employer]  [phone]

## 2018-12-13 NOTE — H&P (Signed)
History and Physical  [name] [name] LGX:[id number] DOB: 09/12/1930 DOA: 12/11/2018  PCP: Briscoe Deutscher, DO Patient coming from: Home, recently discharge from rehab.   I have personally briefly reviewed patient's old medical records in [place]   Chief Complaint: Generalized weakness.   HPI: [name] [name] is a 85 y.o. female medical history significant for hypertension, diastolic heart failure, diabetes, seizure, prior stroke, who was recently discharged from skilled nursing facility presents on February 2 with generalized weakness .  She was last admitted to the hospital from January 6 until January 9.  Patient was admitted at that time with severe spinal stenosis, back pain.  Patient has been waiting in the ED for placement.  She spike a fever at 101 on February 3.  [facility] was consulted by ED physician for further evaluation of fever. Patient denies shortness of breath.  She does relate a dry cough.  Productive at times.  She denies abdominal pain, diarrhea.  Last bowel movement was day prior to admission.  Daughter at bedside reports that patient has been having progressive lower extremities weakness, weakness is started a month ago and has progressively getting worse.  She saw Dr. Gladstone Lighter last week and he was recommending surgery.  She will need to be cleared by her cardiology to undergo spinal surgery.  Patient  has also developed right hand  swelling over the last 24 hours.  It is very tender to palpation.  There is not erythema.  Patient has been also complaining of joint pain, ankle pain.  Per daughter patient has not received her daily dose of prednisone since she has been here in the ED 24 hours.  Patient also report that patient has been progressively more confused and with low energy and weakness for the last month. Elevation in the ED: Sodium 134, potassium 3.5, BUN 13, creatinine 1.1, calcium 8.1, albumin 2.6, hemoglobin 8.5.  Influenza panel negative, UA no significant  white count 025.  Urine culture pending chest x-ray; negative for pneumonia.   Review of Systems: All systems reviewed and apart from history of presenting illness, are negative.  Past Medical History:  Diagnosis Date  . Allergic rhinitis due to pollen 04/27/2007  . Arthritis    "in q joint" (08/07/2013)  . CHF (congestive heart failure) ([place])   . Coronary atherosclerosis of native coronary artery   . Cough   . Disorder of bone and cartilage, unspecified   . Edema 05/03/2013  . Exertional shortness of breath    "sometimes" (08/07/2013)  . First degree atrioventricular block   . Gout, unspecified 04/19/2013  . Hyperlipidemia 04/27/2007  . Hypertension   . Midline low back pain without sciatica 06/27/2014  . Muscle spasm   . Muscle weakness (generalized)   . Myocardial infarction ([place]) ~ 1970  . Onychia and paronychia of toe   . Osteoarthrosis, unspecified whether generalized or localized, unspecified site 04/27/2007  . Other fall   . Other malaise and fatigue   . Pneumonia 05/2018  . Prepatellar bursitis   . Pulmonary embolism ([place]) 07/2018  . Rheumatic nodule 06/28/2017  . Seizures ([place])   . Stroke ([place]) 01/17/2014  . TIA (transient ischemic attack)    "a few one summer" (08/07/2013)  . Type II diabetes mellitus ([place])    "fasting 90-110s" (08/07/2013)  . Unspecified essential hypertension 08/08/2013  . Unspecified vitamin D deficiency    Past Surgical History:  Procedure Laterality Date  . ABDOMINAL HYSTERECTOMY  04/1980  . APPENDECTOMY  [place]  2003  . CATARACT EXTRACTION W/ INTRAOCULAR LENS  IMPLANT, BILATERAL Bilateral ~ 2012  . CHOLECYSTECTOMY N/A 06/26/2018   Procedure: LAPAROSCOPIC CHOLECYSTECTOMY POSSIBLE INTRAOPERATIVE CHOLANGIOGRAM;  Surgeon: Coralie Keens, MD;  Location: [facility];  Service: General;  Laterality: N/A;  . JOINT REPLACEMENT    . REPLACEMENT TOTAL KNEE Right 09/2005  . RIGHT/LEFT HEART CATH AND CORONARY ANGIOGRAPHY N/A  06/01/2018   Procedure: RIGHT/LEFT HEART CATH AND CORONARY ANGIOGRAPHY;  Surgeon: Jettie Booze, MD;  Location: [facility];  Service: Cardiovascular;  Laterality: N/A;  . SHOULDER OPEN ROTATOR CUFF REPAIR Right 07/1999  . TONSILLECTOMY  08/1974  . TOTAL KNEE ARTHROPLASTY Left 08/07/2013   Procedure: TOTAL KNEE ARTHROPLASTY- LEFT;  Surgeon: Vickey Huger, MD;  Location: [facility];  Service: Orthopedics;  Laterality: Left;  . TRANSTHORACIC ECHOCARDIOGRAM  2003   EF 55-65%; mild concentric LVH   Social History:  reports that she has never smoked. She has never used smokeless tobacco. She reports that she does not drink alcohol or use drugs.   Allergies  Allergen Reactions  . Clonidine Derivatives Swelling    [name]  . Fish Allergy Diarrhea, Swelling and Other (See Comments)    Turns skin "black," but can tolerate white fish Salmon- Diarrhea  . Shellfish Allergy Hives  . Doxycycline Rash  . Indomethacin Other (See Comments)    Reaction not recalled by the patient  . Lyrica [Pregabalin] Other (See Comments)    Hallucinations   . Methyldopa Other (See Comments)    Aldomet (for hypertension): Reaction not recalled by the patient  . Morphine And Related Other (See Comments)    Family reports it drops her bp that she needs iv fluids  . Orange Fruit [Citrus] Other (See Comments)    Indigestion/heartburn  . Strawberry (Diagnostic) Itching  . Cetirizine Hcl Itching and Rash  . Codeine Itching  . Levaquin [Levofloxacin In D5w] Rash  . Tomato Rash    Family History  Problem Relation Age of Onset  . Heart attack Father     Prior to Admission medications   Medication Sig Start Date End Date Taking? Authorizing Provider  albuterol (PROVENTIL HFA;VENTOLIN HFA) 108 (90 Base) MCG/ACT inhaler Inhale 1-2 puffs into the lungs every 6 (six) hours as needed for wheezing or shortness of breath. 12/08/18  Yes Hennie Duos, MD  allopurinol (ZYLOPRIM) 100 MG tablet Take 1 tablet (100 mg total) by mouth at bedtime. 12/08/18  Yes Hennie Duos, MD  carvedilol (COREG) 25 MG tablet Take 1 tablet (25 mg total) by mouth 2 (two) times daily with a meal. 12/08/18  Yes Hennie Duos, MD  D3-1000 25 MCG (1000 UT) capsule Take 1 capsule (1,000 Units total) by mouth daily. 12/08/18  Yes Hennie Duos, MD  diclofenac sodium (VOLTAREN) 1 % GEL Apply 2 g topically daily as needed (pain). 12/08/18  Yes Hennie Duos, MD  ENSURE (ENSURE) Take 237 mLs by mouth every evening.    Yes [provider]  furosemide (LASIX) 20 MG tablet Take 1 tablet (20 mg total) by mouth daily. 12/08/18  Yes Hennie Duos, MD  gabapentin (NEURONTIN) 400 MG capsule Take 1 capsule (400 mg total) by mouth 3 (three) times daily. 12/08/18  Yes Hennie Duos, MD  hydrALAZINE (APRESOLINE) 100 MG tablet Take 1 tablet (100 mg total) by mouth 3 (three) times daily. 12/08/18  Yes Hennie Duos,  MD  HYDROcodone-acetaminophen (NORCO/VICODIN) 5-325 MG tablet Take 1 tablet by mouth every 6 (six) hours. 12/08/18  Yes Hennie Duos, MD  hydroxychloroquine (PLAQUENIL) 200 MG tablet Take 1 tablet (200 mg total) by mouth 2 (two) times daily. 12/08/18  Yes Hennie Duos, MD  hydroxypropyl methylcellulose / hypromellose (ISOPTO TEARS / GONIOVISC) 2.5 % ophthalmic solution Place 1 drop into both eyes daily. 12/08/18  Yes Hennie Duos, MD  isosorbide mononitrate (IMDUR) 30 MG 24 hr tablet Take 1 tablet (30 mg total) by mouth at bedtime. 12/08/18  Yes Hennie Duos, MD  leflunomide (ARAVA) 20 MG tablet Take 1 tablet (20 mg total) by mouth daily. 12/08/18  Yes Hennie Duos, MD  Melatonin 3 MG TABS Take 1 tablet (3 mg total) by mouth at bedtime. 12/08/18  Yes Hennie Duos, MD  Olopatadine HCl 0.2 % SOLN Place 1 drop into both eyes daily. 12/08/18  Yes Hennie Duos, MD  predniSONE (DELTASONE) 10 MG tablet Take 10 mg by mouth  daily with breakfast.   Yes [provider]  rivaroxaban (XARELTO) 20 MG TABS tablet Take 1 tablet (20 mg total) by mouth daily with supper. 12/08/18  Yes Hennie Duos, MD  sacubitril-valsartan (ENTRESTO) 49-51 MG Take 1 tablet by mouth daily at 12 noon. 12/08/18  Yes Hennie Duos, MD  triamcinolone cream (KENALOG) 0.1 % Apply 1 application topically 2 (two) times daily as needed (for itching- to affected sites). 12/08/18  Yes Hennie Duos, MD  vitamin C (ASCORBIC ACID) 500 MG tablet Take 1 tablet (500 mg total) by mouth daily. 12/08/18  Yes Hennie Duos, MD   Physical Exam: Vitals:   12/13/18 1151 12/13/18 1332 12/13/18 1518 12/13/18 1525  BP: (!) 151/74  138/84   Pulse: 89  85   Resp: (!) 22  16   Temp: (!) 102.7 F (39.3 C) (!) 100.4 F (38 C) 100.1 F (37.8 C)   TempSrc: Oral Oral Oral   SpO2: 93%  96%   Weight:    78 kg  Height:    _0  (1.626 m)     General exam: Moderately built and nourished patient, lying comfortably supine on the gurney in no obvious distress.  Head, eyes and ENT: Nontraumatic and normocephalic. Pupils equally reacting to light and accommodation. Oral mucosa moist.  Neck: Supple. No JVD, carotid bruit or thyromegaly.  Lymphatics: No lymphadenopathy.  Respiratory system: Clear to auscultation. No increased work of breathing.  Cardiovascular system: S1 and S2 heard, RRR. No JVD, murmurs, gallops, clicks or pedal edema.  Gastrointestinal system: Abdomen is nondistended, soft and nontender. Normal bowel sounds heard. No organomegaly or masses appreciated.  Central nervous system: Alert and oriented.  Motor strength: 3 out of 5  Extremities:  Peripheral pulses symmetrically felt.   Skin: No rashes or acute findings.  Musculoskeletal system: Negative exam.  Psychiatry: Pleasant and cooperative.   Labs on Admission:  Basic Metabolic Panel: Recent Labs  Lab 12/08/18 12/11/18 1103 12/13/18 0845  NA 141 136 134*  K 3.8  3.7 3.5  CL  --  103 102  CO2  --  25 24  GLUCOSE  --  99 109*  BUN _1 CREATININE 1.0 0.98 1.15*  CALCIUM  --  8.4* 8.1*   Liver Function Tests: Recent Labs  Lab 12/11/18 1103 12/13/18 0845  AST 24 16  ALT 7 9  ALKPHOS 42 36*  BILITOT 0.9 0.5  PROT 6.1* 5.5*  ALBUMIN 3.1* 2.6*   No results for input(s): LIPASE, AMYLASE in the last 168 hours. No results for input(s): AMMONIA in the last 168 hours. CBC: Recent Labs  Lab 12/08/18 12/11/18 1103 12/13/18 0845  WBC 6.3 6.1 5.3  NEUTROABS  --  3.6  --   HGB 8.7* 9.2* 8.5*  HCT 26* 29.0* 27.5*  MCV  --  98.6 97.5  PLT 269 298 239   Cardiac Enzymes: No results for input(s): CKTOTAL, CKMB, CKMBINDEX, TROPONINI in the last 168 hours.  BNP (last 3 results) No results for input(s): PROBNP in the last 8760 hours. CBG: Recent Labs  Lab 12/11/18 0956  GLUCAP 89    Radiological Exams on Admission: Dg Chest 2 View  Result Date: 12/13/2018 CLINICAL DATA:  Fever, hypoxia. EXAM: CHEST - 2 VIEW COMPARISON:  Radiograph of November 15, 2018 FINDINGS: Stable cardiomegaly. No pneumothorax or pleural effusion is noted. Both lungs are clear. The visualized skeletal structures are unremarkable. IMPRESSION: No active cardiopulmonary disease. Electronically Signed   By: Marijo Conception, [name]   On: 12/13/2018 09:51    EKG: Independently reviewed.    Assessment/Plan Active Problems:   Type 2 diabetes mellitus with hyperlipidemia ([place]), patient declines statin   Hypertension associated with diabetes ([place])   CAD (coronary artery disease)   Rheumatoid arthritis involving multiple sites ([place])   CKD stage 3 due to type 2 diabetes mellitus ([facility])   Chronic combined systolic and diastolic CHF (congestive heart failure) ([facility])   Deep vein thrombosis (DVT) of distal vein of right lower extremity ([facility])   Fever   1-Fever of unknown etiology. This could be related to infectious process.  Follow blood cultures, follow urine culture. She has  a swelling of the right hand.  We will get an MRI. Continue with IV vancomycin and cefepime. Also could be related to her rheumatoid arthritis. If MRI of the right hand is unrevealing, patient will require further evaluation like MRI of the lumbar spine and/or CT abdomen or chest.  -Check ESR, rheumatoid factor.  2-Rheumatoid arthritis: Will resume prednisone, will do higher dose 40 mg daily in case that this is rheumatoid arthritis flare. Reports multiple joints pain. Check uric acid. Continue with Plaquenil.  3-Chronic combined systolic and diastolic heart failure: Continue with Lasix, Entresto, Imdur, carvedilol.  Appears compensated.   4-history of DVT: Continue with Xarelto.  5-acute on chronic metabolic encephalopathy: Per daughter patient has been more confused, less interactive.  More weak. Suspect related to febrile illness.  6-severe spinal stenosis:; Might need repeated MRI, if no clear source for infection is found.  7-anemia: Iron deficiency: Need to resume iron supplement infection is controlled. Follow hemoglobin trend.  8-mild hyponatremia: Monitor.  DVT Prophylaxis: On Xarelto Code Status: Full code Family Communication: Daughter who was at bedside Disposition Plan: Admit to the hospital under observation for further evaluation of fever.  Patient likely require placement  Time spent: 75 minutes.   Elmarie Shiley MD [facility]   12/13/2018, 5:29 PM

## 2018-12-13 NOTE — Telephone Encounter (Signed)
Please see message and advise 

## 2018-12-13 NOTE — Progress Notes (Signed)
A consult was received from an ED physician for Vancomycin and Cefepime per pharmacy dosing (for an indication other than meningitis). The patient's profile has been reviewed for ht/wt/allergies/indication/available labs. A one time order has been placed for the above antibiotics.  Further antibiotics/pharmacy consults should be ordered by admitting physician if indicated.                       Bernadene Person, PharmD, BCPS [phone] 12/13/2018, 1:08 PM

## 2018-12-13 NOTE — ED Provider Notes (Signed)
Patient presented to the emergency department 2 days ago for increasing generalized weakness and too weak to get up out of bed.  No clear etiology was found in the ER and the plan was for the patient to go back to a rehabilitation facility.  I was alerted by nursing staff this morning that the patient had a fever to 101.8 yesterday as well as mild hypoxia down to 88% as well as recurrent fever today in the emergency department to 101.6 at 7:59 AM and 102.7 at 1151.  The patient has no focal complaints.  She still complains of mild nonproductive cough.  No significant shortness of breath.  She denies urinary symptoms.  No new rash.  She has been at a rehab facility secondary to worsening spinal stenosis and need for surgery to improve her mobility and her spinal stenosis with associated myelopathy.  Patient is sitting up eating food at this time.  Given her persistent fever both yesterday as well as today and her advanced age of age 85 with generalized weakness I will put the patient in the hospital for ongoing work-up.  Repeat blood work was obtained without acute pathology.  Chest x-ray demonstrates no evidence of pneumonia.  Influenza swab here is negative.  Blood cultures have been obtained.  Discussed the possibility of broad-spectrum antibiotics with the admitting team.  At this point with her advanced age and fever over the past 48 hours and increasing generalized weakness I do not think she needs placement in a rehab facility as there appears to be an acute medical issue leading to these symptoms.  Early January she was also hospitalized with fever unknown etiology and this was thought to be secondary to a flare of her rheumatoid arthritis.  Results for orders placed or performed during the hospital encounter of 12/11/18  Comprehensive metabolic panel  Result Value Ref Range   Sodium 136 135 - 145 mmol/L   Potassium 3.7 3.5 - 5.1 mmol/L   Chloride 103 98 - 111 mmol/L   CO2 25 22 - 32 mmol/L    Glucose, Bld 99 70 - 99 mg/dL   BUN 12 8 - 23 mg/dL   Creatinine, Ser 1.61 0.44 - 1.00 mg/dL   Calcium 8.4 (L) 8.9 - 10.3 mg/dL   Total Protein 6.1 (L) 6.5 - 8.1 g/dL   Albumin 3.1 (L) 3.5 - 5.0 g/dL   AST 24 15 - 41 U/L   ALT 7 0 - 44 U/L   Alkaline Phosphatase 42 38 - 126 U/L   Total Bilirubin 0.9 0.3 - 1.2 mg/dL   GFR calc non Af Amer 52 (L) >60 mL/min   GFR calc Af Amer >60 >60 mL/min   Anion gap 8 5 - 15  CBC with Differential  Result Value Ref Range   WBC 6.1 4.0 - 10.5 K/uL   RBC 2.94 (L) 3.87 - 5.11 MIL/uL   Hemoglobin 9.2 (L) 12.0 - 15.0 g/dL   HCT 09.6 (L) 04.5 - 40.9 %   MCV 98.6 80.0 - 100.0 fL   MCH 31.3 26.0 - 34.0 pg   MCHC 31.7 30.0 - 36.0 g/dL   RDW 81.1 (H) 91.4 - 78.2 %   Platelets 298 150 - 400 K/uL   nRBC 0.0 0.0 - 0.2 %   Neutrophils Relative % 59 %   Neutro Abs 3.6 1.7 - 7.7 K/uL   Lymphocytes Relative 27 %   Lymphs Abs 1.6 0.7 - 4.0 K/uL   Monocytes Relative 9 %  Monocytes Absolute 0.6 0.1 - 1.0 K/uL   Eosinophils Relative 4 %   Eosinophils Absolute 0.3 0.0 - 0.5 K/uL   Basophils Relative 1 %   Basophils Absolute 0.1 0.0 - 0.1 K/uL   Immature Granulocytes 0 %   Abs Immature Granulocytes 0.02 0.00 - 0.07 K/uL  Urinalysis, Routine w reflex microscopic  Result Value Ref Range   Color, Urine YELLOW YELLOW   APPearance CLEAR CLEAR   Specific Gravity, Urine 1.011 1.005 - 1.030   pH 7.0 5.0 - 8.0   Glucose, UA NEGATIVE NEGATIVE mg/dL   Hgb urine dipstick NEGATIVE NEGATIVE   Bilirubin Urine NEGATIVE NEGATIVE   Ketones, ur NEGATIVE NEGATIVE mg/dL   Protein, ur NEGATIVE NEGATIVE mg/dL   Nitrite NEGATIVE NEGATIVE   Leukocytes, UA TRACE (A) NEGATIVE   RBC / HPF 0-5 0 - 5 RBC/hpf   WBC, UA 0-5 0 - 5 WBC/hpf   Bacteria, UA NONE SEEN NONE SEEN   Squamous Epithelial / LPF 6-10 0 - 5  Influenza panel by PCR (type A & B)  Result Value Ref Range   Influenza A By PCR NEGATIVE NEGATIVE   Influenza B By PCR NEGATIVE NEGATIVE  CBC  Result Value Ref Range    WBC 5.3 4.0 - 10.5 K/uL   RBC 2.82 (L) 3.87 - 5.11 MIL/uL   Hemoglobin 8.5 (L) 12.0 - 15.0 g/dL   HCT 78.227.5 (L) 95.636.0 - 21.346.0 %   MCV 97.5 80.0 - 100.0 fL   MCH 30.1 26.0 - 34.0 pg   MCHC 30.9 30.0 - 36.0 g/dL   RDW 08.616.8 (H) 57.811.5 - 46.915.5 %   Platelets 239 150 - 400 K/uL   nRBC 0.0 0.0 - 0.2 %  Comprehensive metabolic panel  Result Value Ref Range   Sodium 134 (L) 135 - 145 mmol/L   Potassium 3.5 3.5 - 5.1 mmol/L   Chloride 102 98 - 111 mmol/L   CO2 24 22 - 32 mmol/L   Glucose, Bld 109 (H) 70 - 99 mg/dL   BUN 13 8 - 23 mg/dL   Creatinine, Ser 6.291.15 (H) 0.44 - 1.00 mg/dL   Calcium 8.1 (L) 8.9 - 10.3 mg/dL   Total Protein 5.5 (L) 6.5 - 8.1 g/dL   Albumin 2.6 (L) 3.5 - 5.0 g/dL   AST 16 15 - 41 U/L   ALT 9 0 - 44 U/L   Alkaline Phosphatase 36 (L) 38 - 126 U/L   Total Bilirubin 0.5 0.3 - 1.2 mg/dL   GFR calc non Af Amer 43 (L) >60 mL/min   GFR calc Af Amer 50 (L) >60 mL/min   Anion gap 8 5 - 15  Urinalysis, Routine w reflex microscopic  Result Value Ref Range   Color, Urine YELLOW YELLOW   APPearance HAZY (A) CLEAR   Specific Gravity, Urine 1.013 1.005 - 1.030   pH 5.0 5.0 - 8.0   Glucose, UA NEGATIVE NEGATIVE mg/dL   Hgb urine dipstick NEGATIVE NEGATIVE   Bilirubin Urine NEGATIVE NEGATIVE   Ketones, ur NEGATIVE NEGATIVE mg/dL   Protein, ur NEGATIVE NEGATIVE mg/dL   Nitrite NEGATIVE NEGATIVE   Leukocytes, UA SMALL (A) NEGATIVE   RBC / HPF 0-5 0 - 5 RBC/hpf   WBC, UA 0-5 0 - 5 WBC/hpf   Bacteria, UA NONE SEEN NONE SEEN   Squamous Epithelial / LPF 11-20 0 - 5   Mucus PRESENT   CBG monitoring, ED  Result Value Ref Range   Glucose-Capillary 89 70 -  99 mg/dL      Azalia Bilis, MD 12/13/18 1300

## 2018-12-13 NOTE — Progress Notes (Signed)
Pharmacy Antibiotic Note  [name] [name] is a 85 y.o. female admitted on 12/11/2018 with suspected hand infection.  Pharmacy has been consulted for vancomycin and cefepime dosing.  Plan:  Vancomycin 1000 mg IV now, then 1250 mg IV q36 hr (est AUC 501 based on SCr 1.15)  Measure vancomycin AUC at steady state as indicated  Cefepime 1 g IV q12 hr  SCr q48 hr while on vancomycin    Height: 5\' 4"  (162.6 cm) Weight: 172 lb (78 kg) IBW/kg (Calculated) : 54.7  Temp (24hrs), Avg:101.6 F (38.7 C), Min:100.4 F (38 C), Max:102.7 F (39.3 C)  Recent Labs  Lab 12/08/18 12/11/18 1103 12/13/18 0845  WBC 6.3 6.1 5.3  CREATININE 1.0 0.98 1.15*    Estimated Creatinine Clearance: 35.5 mL/min (A) (by C-G formula based on SCr of 1.15 mg/dL (H)).    Allergies  Allergen Reactions  . Clonidine Derivatives Swelling    [name]  . Fish Allergy Diarrhea, Swelling and Other (See Comments)    Turns skin "black," but can tolerate white fish Salmon- Diarrhea  . Shellfish Allergy Hives  . Doxycycline Rash  . Indomethacin Other (See Comments)    Reaction not recalled by the patient  . Lyrica [Pregabalin] Other (See Comments)    Hallucinations   . Methyldopa Other (See Comments)    Aldomet (for hypertension): Reaction not recalled by the patient  . Morphine And Related Other (See Comments)    Family reports it drops her bp that she needs iv fluids  . Orange Fruit [Citrus] Other (See Comments)    Indigestion/heartburn  . Strawberry (Diagnostic) Itching  . Cetirizine Hcl Itching and Rash  . Codeine Itching  . Levaquin [Levofloxacin In D5w] Rash  . Tomato Rash    Antimicrobials this admission: 2/4 vancomycin >>  2/4 cefepime >>   Dose adjustments this admission: n/a  Microbiology results: 2/4 BCx: sent 2/4 UCx: sent   Thank you for allowing pharmacy to be a part of this patient's care.  Bernadene Person, PharmD,  BCPS [phone] 12/13/2018, 2:55 PM

## 2018-12-14 ENCOUNTER — Encounter (HOSPITAL_COMMUNITY): Payer: Self-pay | Admitting: Certified Registered"

## 2018-12-14 ENCOUNTER — Encounter (HOSPITAL_COMMUNITY)
Admission: EM | Disposition: A | Discharge status: Skilled Nursing Facility | Payer: Self-pay | Source: Home / Self Care | Attending: Internal Medicine

## 2018-12-14 DIAGNOSIS — M0689 Other specified rheumatoid arthritis, multiple sites: Secondary | ICD-10-CM | POA: Diagnosis not present

## 2018-12-14 DIAGNOSIS — M47816 Spondylosis without myelopathy or radiculopathy, lumbar region: Secondary | ICD-10-CM | POA: Diagnosis not present

## 2018-12-14 DIAGNOSIS — M6281 Muscle weakness (generalized): Secondary | ICD-10-CM | POA: Diagnosis not present

## 2018-12-14 DIAGNOSIS — E785 Hyperlipidemia, unspecified: Secondary | ICD-10-CM | POA: Diagnosis present

## 2018-12-14 DIAGNOSIS — I5042 Chronic combined systolic (congestive) and diastolic (congestive) heart failure: Secondary | ICD-10-CM | POA: Diagnosis not present

## 2018-12-14 DIAGNOSIS — Z881 Allergy status to other antibiotic agents status: Secondary | ICD-10-CM | POA: Diagnosis not present

## 2018-12-14 DIAGNOSIS — E1169 Type 2 diabetes mellitus with other specified complication: Secondary | ICD-10-CM | POA: Diagnosis not present

## 2018-12-14 DIAGNOSIS — R2681 Unsteadiness on feet: Secondary | ICD-10-CM | POA: Diagnosis not present

## 2018-12-14 DIAGNOSIS — I2699 Other pulmonary embolism without acute cor pulmonale: Secondary | ICD-10-CM | POA: Diagnosis not present

## 2018-12-14 DIAGNOSIS — E669 Obesity, unspecified: Secondary | ICD-10-CM | POA: Diagnosis present

## 2018-12-14 DIAGNOSIS — L02413 Cutaneous abscess of right upper limb: Secondary | ICD-10-CM | POA: Diagnosis not present

## 2018-12-14 DIAGNOSIS — E1159 Type 2 diabetes mellitus with other circulatory complications: Secondary | ICD-10-CM

## 2018-12-14 DIAGNOSIS — R509 Fever, unspecified: Secondary | ICD-10-CM | POA: Diagnosis not present

## 2018-12-14 DIAGNOSIS — G92 Toxic encephalopathy: Secondary | ICD-10-CM | POA: Diagnosis present

## 2018-12-14 DIAGNOSIS — Z01818 Encounter for other preprocedural examination: Secondary | ICD-10-CM | POA: Diagnosis not present

## 2018-12-14 DIAGNOSIS — I10 Essential (primary) hypertension: Secondary | ICD-10-CM

## 2018-12-14 DIAGNOSIS — M064 Inflammatory polyarthropathy: Secondary | ICD-10-CM | POA: Diagnosis present

## 2018-12-14 DIAGNOSIS — I429 Cardiomyopathy, unspecified: Secondary | ICD-10-CM | POA: Diagnosis not present

## 2018-12-14 DIAGNOSIS — Z86711 Personal history of pulmonary embolism: Secondary | ICD-10-CM | POA: Diagnosis not present

## 2018-12-14 DIAGNOSIS — E876 Hypokalemia: Secondary | ICD-10-CM | POA: Diagnosis not present

## 2018-12-14 DIAGNOSIS — M109 Gout, unspecified: Secondary | ICD-10-CM | POA: Diagnosis not present

## 2018-12-14 DIAGNOSIS — J9601 Acute respiratory failure with hypoxia: Secondary | ICD-10-CM | POA: Diagnosis present

## 2018-12-14 DIAGNOSIS — I152 Hypertension secondary to endocrine disorders: Secondary | ICD-10-CM | POA: Diagnosis not present

## 2018-12-14 DIAGNOSIS — R569 Unspecified convulsions: Secondary | ICD-10-CM | POA: Diagnosis not present

## 2018-12-14 DIAGNOSIS — E871 Hypo-osmolality and hyponatremia: Secondary | ICD-10-CM | POA: Diagnosis present

## 2018-12-14 DIAGNOSIS — M069 Rheumatoid arthritis, unspecified: Secondary | ICD-10-CM | POA: Diagnosis present

## 2018-12-14 DIAGNOSIS — I825Z1 Chronic embolism and thrombosis of unspecified deep veins of right distal lower extremity: Secondary | ICD-10-CM | POA: Diagnosis not present

## 2018-12-14 DIAGNOSIS — I2510 Atherosclerotic heart disease of native coronary artery without angina pectoris: Secondary | ICD-10-CM | POA: Diagnosis not present

## 2018-12-14 DIAGNOSIS — M0579 Rheumatoid arthritis with rheumatoid factor of multiple sites without organ or systems involvement: Secondary | ICD-10-CM

## 2018-12-14 DIAGNOSIS — M62838 Other muscle spasm: Secondary | ICD-10-CM | POA: Diagnosis not present

## 2018-12-14 DIAGNOSIS — Z6831 Body mass index (BMI) 31.0-31.9, adult: Secondary | ICD-10-CM | POA: Diagnosis not present

## 2018-12-14 DIAGNOSIS — I2782 Chronic pulmonary embolism: Secondary | ICD-10-CM | POA: Diagnosis not present

## 2018-12-14 DIAGNOSIS — M2550 Pain in unspecified joint: Secondary | ICD-10-CM | POA: Diagnosis not present

## 2018-12-14 DIAGNOSIS — M549 Dorsalgia, unspecified: Secondary | ICD-10-CM | POA: Diagnosis not present

## 2018-12-14 DIAGNOSIS — I2609 Other pulmonary embolism with acute cor pulmonale: Secondary | ICD-10-CM | POA: Diagnosis not present

## 2018-12-14 DIAGNOSIS — I69398 Other sequelae of cerebral infarction: Secondary | ICD-10-CM | POA: Diagnosis not present

## 2018-12-14 DIAGNOSIS — N183 Chronic kidney disease, stage 3 (moderate): Secondary | ICD-10-CM | POA: Diagnosis not present

## 2018-12-14 DIAGNOSIS — Z885 Allergy status to narcotic agent status: Secondary | ICD-10-CM | POA: Diagnosis not present

## 2018-12-14 DIAGNOSIS — D631 Anemia in chronic kidney disease: Secondary | ICD-10-CM | POA: Diagnosis not present

## 2018-12-14 DIAGNOSIS — I130 Hypertensive heart and chronic kidney disease with heart failure and stage 1 through stage 4 chronic kidney disease, or unspecified chronic kidney disease: Secondary | ICD-10-CM | POA: Diagnosis not present

## 2018-12-14 DIAGNOSIS — M48061 Spinal stenosis, lumbar region without neurogenic claudication: Secondary | ICD-10-CM | POA: Diagnosis not present

## 2018-12-14 DIAGNOSIS — Z91013 Allergy to seafood: Secondary | ICD-10-CM | POA: Diagnosis not present

## 2018-12-14 DIAGNOSIS — M4807 Spinal stenosis, lumbosacral region: Secondary | ICD-10-CM | POA: Diagnosis not present

## 2018-12-14 DIAGNOSIS — E1121 Type 2 diabetes mellitus with diabetic nephropathy: Secondary | ICD-10-CM | POA: Diagnosis not present

## 2018-12-14 DIAGNOSIS — I160 Hypertensive urgency: Secondary | ICD-10-CM | POA: Diagnosis not present

## 2018-12-14 DIAGNOSIS — Z7401 Bed confinement status: Secondary | ICD-10-CM | POA: Diagnosis not present

## 2018-12-14 DIAGNOSIS — E1122 Type 2 diabetes mellitus with diabetic chronic kidney disease: Secondary | ICD-10-CM | POA: Diagnosis not present

## 2018-12-14 LAB — CBC
HCT: 26 % — ABNORMAL LOW (ref 36.0–46.0)
Hemoglobin: 8.3 g/dL — ABNORMAL LOW (ref 12.0–15.0)
MCH: 30.7 pg (ref 26.0–34.0)
MCHC: 31.9 g/dL (ref 30.0–36.0)
MCV: 96.3 fL (ref 80.0–100.0)
Platelets: 229 10*3/uL (ref 150–400)
RBC: 2.7 MIL/uL — ABNORMAL LOW (ref 3.87–5.11)
RDW: 16.4 % — ABNORMAL HIGH (ref 11.5–15.5)
WBC: 4.9 10*3/uL (ref 4.0–10.5)
nRBC: 0 % (ref 0.0–0.2)

## 2018-12-14 LAB — GLUCOSE, CAPILLARY: Glucose-Capillary: 149 mg/dL — ABNORMAL HIGH (ref 70–99)

## 2018-12-14 LAB — COMPREHENSIVE METABOLIC PANEL
ALT: 11 U/L (ref 0–44)
AST: 19 U/L (ref 15–41)
Albumin: 2.4 g/dL — ABNORMAL LOW (ref 3.5–5.0)
Alkaline Phosphatase: 39 U/L (ref 38–126)
Anion gap: 8 (ref 5–15)
BUN: 17 mg/dL (ref 8–23)
CO2: 22 mmol/L (ref 22–32)
Calcium: 7.9 mg/dL — ABNORMAL LOW (ref 8.9–10.3)
Chloride: 104 mmol/L (ref 98–111)
Creatinine, Ser: 1 mg/dL (ref 0.44–1.00)
GFR calc Af Amer: 59 mL/min — ABNORMAL LOW (ref >60)
GFR calc non Af Amer: 51 mL/min — ABNORMAL LOW (ref >60)
Glucose, Bld: 178 mg/dL — ABNORMAL HIGH (ref 70–99)
Potassium: 3.7 mmol/L (ref 3.5–5.1)
Sodium: 134 mmol/L — ABNORMAL LOW (ref 135–145)
Total Bilirubin: 0.3 mg/dL (ref 0.3–1.2)
Total Protein: 5.5 g/dL — ABNORMAL LOW (ref 6.5–8.1)

## 2018-12-14 LAB — URINE CULTURE: Culture: NO GROWTH

## 2018-12-14 LAB — SURGICAL PCR SCREEN
MRSA, PCR: NEGATIVE
Staphylococcus aureus: NEGATIVE

## 2018-12-14 LAB — RHEUMATOID FACTOR: Rheumatoid fact SerPl-aCnc: 22.1 IU/mL — ABNORMAL HIGH (ref 0.0–13.9)

## 2018-12-14 SURGERY — IRRIGATION AND DEBRIDEMENT EXTREMITY
Anesthesia: Choice | Laterality: Right

## 2018-12-14 MED ORDER — POVIDONE-IODINE 10 % EX SWAB
2.0000 "application " | Freq: Once | CUTANEOUS | Status: AC
  Administered 2018-12-14: 2 via TOPICAL

## 2018-12-14 MED ORDER — FENTANYL CITRATE (PF) 250 MCG/5ML IJ SOLN
INTRAMUSCULAR | Status: AC
  Filled 2018-12-14: qty 5

## 2018-12-14 MED ORDER — LEFLUNOMIDE 20 MG PO TABS: 20.0000 mg | ORAL_TABLET | Freq: Every day | ORAL | Status: DC

## 2018-12-14 MED ORDER — LACTATED RINGERS IV SOLN
Freq: Once | INTRAVENOUS | Status: AC
  Administered 2018-12-15: via INTRAVENOUS

## 2018-12-14 MED ORDER — PREDNISONE 20 MG PO TABS
20.0000 mg | ORAL_TABLET | Freq: Every day | ORAL | Status: DC
  Administered 2018-12-15 – 2018-12-16 (×2): 20 mg via ORAL
  Filled 2018-12-14 (×2): qty 1

## 2018-12-14 MED ORDER — CHLORHEXIDINE GLUCONATE 4 % EX LIQD
60.0000 mL | Freq: Once | CUTANEOUS | Status: AC
  Administered 2018-12-14: 4 via TOPICAL
  Filled 2018-12-14: qty 60

## 2018-12-14 MED ORDER — RIVAROXABAN 20 MG PO TABS: 20.0000 mg | ORAL_TABLET | Freq: Every day | ORAL | Status: DC

## 2018-12-14 NOTE — Progress Notes (Signed)
Patient transferring to [name] for I & D of right wrist. [facility] given report.

## 2018-12-14 NOTE — Consult Note (Addendum)
Reason for Consult:Right wrist abscess Referring Physician: A Hongalgi  [name] [name] is an 85 y.o. female.  HPI: [name] [name] has had a complicated medical course over the last several weeks. [name] A MRI was c/w an abscess and hand surgery was consulted.  Past Medical History:  Diagnosis Date  . Allergic rhinitis due to pollen 04/27/2007  . Arthritis    "in q joint" (08/07/2013)  . CHF (congestive heart failure) (HCC)   . Coronary atherosclerosis of native coronary artery   . Cough   . Disorder of bone and cartilage, unspecified   . Edema 05/03/2013  . Exertional shortness of breath    "sometimes" (08/07/2013)  . First degree atrioventricular block   . Gout, unspecified 04/19/2013  . Hyperlipidemia 04/27/2007  . Hypertension   . Midline low back pain without sciatica 06/27/2014  . Muscle spasm   . Muscle weakness (generalized)   . Myocardial infarction (HCC) ~ 1970  . Onychia and paronychia of toe   . Osteoarthrosis, unspecified whether generalized or localized, unspecified site 04/27/2007  . Other fall   . Other malaise and fatigue   . Pneumonia 05/2018  . Prepatellar bursitis   . Pulmonary embolism (HCC) 07/2018  . Rheumatic nodule 06/28/2017  . Seizures (HCC)   . Stroke (HCC) 01/17/2014  . TIA (transient ischemic attack)    "a few one summer" (08/07/2013)  . Type II diabetes mellitus (HCC)    "fasting 90-110s" (08/07/2013)  . Unspecified essential hypertension 08/08/2013  . Unspecified vitamin D deficiency     Past Surgical History:  Procedure Laterality Date  . ABDOMINAL HYSTERECTOMY  04/1980  . APPENDECTOMY  1960  . CARDIAC CATHETERIZATION  2003  . CATARACT EXTRACTION W/ INTRAOCULAR LENS  IMPLANT,  BILATERAL Bilateral ~ 2012  . CHOLECYSTECTOMY N/A 06/26/2018   Procedure: LAPAROSCOPIC CHOLECYSTECTOMY POSSIBLE INTRAOPERATIVE CHOLANGIOGRAM;  Surgeon: Abigail Miyamoto, MD;  Location: [facility] OR;  Service: General;  Laterality: N/A;  . JOINT REPLACEMENT    . REPLACEMENT TOTAL KNEE Right 09/2005  . RIGHT/LEFT HEART CATH AND CORONARY ANGIOGRAPHY N/A 06/01/2018   Procedure: RIGHT/LEFT HEART CATH AND CORONARY ANGIOGRAPHY;  Surgeon: Corky Crafts, MD;  Location: [facility]VASIVE CV LAB;  Service: Cardiovascular;  Laterality: N/A;  . SHOULDER OPEN ROTATOR CUFF REPAIR Right 07/1999  . TONSILLECTOMY  08/1974  . TOTAL KNEE ARTHROPLASTY Left 08/07/2013   Procedure: TOTAL KNEE ARTHROPLASTY- LEFT;  Surgeon: Dannielle Huh, MD;  Location: [facility] OR;  Service: Orthopedics;  Laterality: Left;  . TRANSTHORACIC ECHOCARDIOGRAM  2003   EF 55-65%; mild concentric LVH    Family History  Problem Relation Age of Onset  . Heart attack Father     Social History:  reports that she has never smoked. She has never used smokeless tobacco. She reports that she does not drink alcohol or use drugs.  Allergies:  Allergies  Allergen Reactions  . Clonidine Derivatives Swelling    [name]  . Fish Allergy Diarrhea, Swelling and Other (See Comments)    Turns skin "black," but can tolerate white fish Salmon- Diarrhea  . Shellfish Allergy Hives  . Doxycycline Rash  . Indomethacin Other (See Comments)  Reaction not recalled by the patient  . Lyrica [Pregabalin] Other (See Comments)    Hallucinations   . Methyldopa Other (See Comments)    Aldomet (for hypertension): Reaction not recalled by the patient  . Morphine And Related Other (See Comments)    Family reports it drops her bp that she needs iv fluids  . Orange Fruit [Citrus] Other (See Comments)    Indigestion/heartburn  . Strawberry (Diagnostic) Itching  . Cetirizine Hcl Itching and Rash  .  Codeine Itching  . Levaquin [Levofloxacin In D5w] Rash  . Tomato Rash    Medications: I have reviewed the patient's current medications.  Results for orders placed or performed during the hospital encounter of 12/11/18 (from the past 48 hour(s))  CBC     Status: Abnormal   Collection Time: 12/13/18  8:45 AM  Result Value Ref Range   WBC 5.3 4.0 - 10.5 K/uL   RBC 2.82 (L) 3.87 - 5.11 MIL/uL   Hemoglobin 8.5 (L) 12.0 - 15.0 g/dL   HCT 13.227.5 (L) 44.036.0 - 10.246.0 %   MCV 97.5 80.0 - 100.0 fL   MCH 30.1 26.0 - 34.0 pg   MCHC 30.9 30.0 - 36.0 g/dL   RDW 72.516.8 (H) 36.611.5 - 44.015.5 %   Platelets 239 150 - 400 K/uL   nRBC 0.0 0.0 - 0.2 %    Comment: Performed at [facility], [address] [place], [place] [address]  Comprehensive metabolic panel     Status: Abnormal   Collection Time: 12/13/18  8:45 AM  Result Value Ref Range   Sodium 134 (L) 135 - 145 mmol/L   Potassium 3.5 3.5 - 5.1 mmol/L   Chloride 102 98 - 111 mmol/L   CO2 24 22 - 32 mmol/L   Glucose, Bld 109 (H) 70 - 99 mg/dL   BUN 13 8 - 23 mg/dL   Creatinine, Ser 5.951.15 (H) 0.44 - 1.00 mg/dL   Calcium 8.1 (L) 8.9 - 10.3 mg/dL   Total Protein 5.5 (L) 6.5 - 8.1 g/dL   Albumin 2.6 (L) 3.5 - 5.0 g/dL   AST 16 15 - 41 U/L   ALT 9 0 - 44 U/L   Alkaline Phosphatase 36 (L) 38 - 126 U/L   Total Bilirubin 0.5 0.3 - 1.2 mg/dL   GFR calc non Af Amer 43 (L) >60 mL/min   GFR calc Af Amer 50 (L) >60 mL/min   Anion gap 8 5 - 15    Comment: Performed at [facility], [address] [place], [place] [address]  Influenza panel by PCR (type A & B)     Status: None   Collection Time: 12/13/18  8:52 AM  Result Value Ref Range   Influenza A By PCR NEGATIVE NEGATIVE   Influenza B By PCR NEGATIVE NEGATIVE    Comment: (NOTE) The Xpert Xpress Flu assay is intended as an aid in the diagnosis of  influenza and should not be used as a sole basis for treatment.  This  assay is FDA approved for nasopharyngeal swab  specimens only. Nasal  washings and aspirates are unacceptable for Xpert Xpress Flu testing. Performed at [facility], [address] [place], [place] [address]   Urinalysis, Routine w reflex microscopic     Status: Abnormal   Collection Time: 12/13/18 11:11 AM  Result Value Ref Range   Color, Urine YELLOW YELLOW   APPearance HAZY (A) CLEAR   Specific Gravity, Urine 1.013 1.005 -  1.030   pH 5.0 5.0 - 8.0   Glucose, UA NEGATIVE NEGATIVE mg/dL   Hgb urine dipstick NEGATIVE NEGATIVE   Bilirubin Urine NEGATIVE NEGATIVE   Ketones, ur NEGATIVE NEGATIVE mg/dL   Protein, ur NEGATIVE NEGATIVE mg/dL   Nitrite NEGATIVE NEGATIVE   Leukocytes, UA SMALL (A) NEGATIVE   RBC / HPF 0-5 0 - 5 RBC/hpf   WBC, UA 0-5 0 - 5 WBC/hpf   Bacteria, UA NONE SEEN NONE SEEN   Squamous Epithelial / LPF 11-20 0 - 5   Mucus PRESENT     Comment: Performed at [facility], [address] [place], [place] [address]  Urine culture     Status: None   Collection Time: 12/13/18 11:11 AM  Result Value Ref Range   Specimen Description      URINE, RANDOM Performed at [facility], [address] [place], [place] [address]    Special Requests      NONE Performed at [facility], [address] [place], [place] [address]    Culture      NO GROWTH Performed at [facility], [address] [place], [place] [address]    Report Status 12/14/2018 FINAL   Sedimentation rate     Status: Abnormal   Collection Time: 12/13/18  2:22 PM  Result Value Ref Range   Sed Rate 55 (H) 0 - 22 mm/hr    Comment: Performed at [facility], [address] [place], [place] [address]  Uric acid     Status: None   Collection Time: 12/13/18  2:22 PM  Result Value Ref Range   Uric Acid, Serum 5.0 2.5 - 7.1 mg/dL    Comment: Performed at [facility], [address] [place], [place] [address]  Rheumatoid factor      Status: Abnormal   Collection Time: 12/13/18  2:22 PM  Result Value Ref Range   Rhuematoid fact SerPl-aCnc 22.1 (H) 0.0 - 13.9 IU/mL    Comment: (NOTE) Performed At: [facility] [address] [place], [place] [id number] Jolene SchimkeNagendra Sanjai MD IH:[id number]:[phone]   Comprehensive metabolic panel     Status: Abnormal   Collection Time: 12/14/18  6:13 AM  Result Value Ref Range   Sodium 134 (L) 135 - 145 mmol/L   Potassium 3.7 3.5 - 5.1 mmol/L   Chloride 104 98 - 111 mmol/L   CO2 22 22 - 32 mmol/L   Glucose, Bld 178 (H) 70 - 99 mg/dL   BUN 17 8 - 23 mg/dL   Creatinine, Ser 7.561.00 0.44 - 1.00 mg/dL   Calcium 7.9 (L) 8.9 - 10.3 mg/dL   Total Protein 5.5 (L) 6.5 - 8.1 g/dL   Albumin 2.4 (L) 3.5 - 5.0 g/dL   AST 19 15 - 41 U/L   ALT 11 0 - 44 U/L   Alkaline Phosphatase 39 38 - 126 U/L   Total Bilirubin 0.3 0.3 - 1.2 mg/dL   GFR calc non Af Amer 51 (L) >60 mL/min   GFR calc Af Amer 59 (L) >60 mL/min   Anion gap 8 5 - 15    Comment: Performed at [facility], [address] [place], [place] [address]  CBC     Status: Abnormal   Collection Time: 12/14/18  6:13 AM  Result Value Ref Range   WBC 4.9 4.0 - 10.5 K/uL   RBC 2.70 (L) 3.87 - 5.11 MIL/uL   Hemoglobin 8.3 (  L) 12.0 - 15.0 g/dL   HCT 94.4 (L) 96.7 - 59.1 %   MCV 96.3 80.0 - 100.0 fL   MCH 30.7 26.0 - 34.0 pg   MCHC 31.9 30.0 - 36.0 g/dL   RDW 63.8 (H) 46.6 - 59.9 %   Platelets 229 150 - 400 K/uL   nRBC 0.0 0.0 - 0.2 %    Comment: Performed at [facility], [address], [place], [place] [address]    Dg Chest 2 View  Result Date: 12/13/2018 CLINICAL DATA:  Fever, hypoxia. EXAM: CHEST - 2 VIEW COMPARISON:  Radiograph of November 15, 2018 FINDINGS: Stable cardiomegaly. No pneumothorax or pleural effusion is noted. Both lungs are clear. The visualized skeletal structures are unremarkable. IMPRESSION: No active cardiopulmonary disease. Electronically Signed   By: Lupita Raider, [name]   On:  12/13/2018 09:51   Mr Hand Right W Wo Contrast  Result Date: 12/13/2018 CLINICAL DATA:  Hand swelling, cellulitis EXAM: MRI OF THE RIGHT HAND WITHOUT AND WITH CONTRAST TECHNIQUE: Multiplanar, multisequence MR imaging of the right hand was performed before and after the administration of intravenous contrast. CONTRAST:  7.5 mL Gadavist COMPARISON:  None. FINDINGS: Bones/Joint/Cartilage No acute fracture or dislocation. Scapholunate widening consistent with a scapholunate ligament tear with proximal migration of the capitate consistent with a SLAC wrist. Severe carpal synovitis. Multiple erosions involving the carpal bones. Carpal crowding. Erosion of the distal radius. Erosion of the first metacarpal head. Small joint effusion and synovitis of the second, third, fourth and fifth MCP joints. Mild osteoarthritis of the first CMC joint. Ligaments Collateral ligaments are intact. Muscles and Tendons Muscles are normal. No muscle atrophy. Flexor and extensor compartment tendons are intact. Soft tissue Soft tissue edema with enhancement along the radial aspect of the distal forearm and wrist. Complex peripherally enhancing fluid collection measuring approximately 1.8 x 4.2 x 6.9 cm along the dorsal radial aspect of the forearm and wrist most concerning for an abscess. No soft tissue mass. IMPRESSION: 1. Cellulitis of the distal radial aspect of forearm and wrist. 1.8 x 4.2 x 6.9 cm peripherally enhancing fluid collection along the dorsal radial aspect of the distal forearm and wrist most concerning for an abscess. The fluid collection extends into the distal forearm which is outside of the field of view limiting full characterization of the extent of fluid collection. 2. Erosive changes of the carpus with carpal crowding and diffuse carpal synovitis as can be seen with an inflammatory arthropathy such as rheumatoid arthritis. 3. Small joint effusion and synovitis of the second, third, fourth and fifth MCP joints. 4.  SLAC wrist. Electronically Signed   By: Elige Ko   On: 12/13/2018 20:48    Review of Systems  Constitutional: Negative for weight loss.  HENT: Negative for ear discharge, ear pain, hearing loss and tinnitus.   Eyes: Negative for blurred vision, double vision, photophobia and pain.  Respiratory: Negative for cough, sputum production and shortness of breath.   Cardiovascular: Negative for chest pain.  Gastrointestinal: Negative for abdominal pain, nausea and vomiting.  Genitourinary: Negative for dysuria, flank pain, frequency and urgency.  Musculoskeletal: Negative for back pain, falls, joint pain, myalgias and neck pain.  Neurological: Negative for dizziness, tingling, sensory change, focal weakness, loss of consciousness and headaches.  Endo/Heme/Allergies: Does not bruise/bleed easily.  Psychiatric/Behavioral: Negative for depression, memory loss and substance abuse. The patient is not nervous/anxious.    Blood pressure 129/65, pulse 66, temperature (!) 97.3 F (36.3 C), temperature  source Oral, resp. rate 12, height 5\' 4"  (1.626 m), weight 78 kg, SpO2 99 %. Physical Exam  Constitutional: She appears well-developed and well-nourished. No distress.  HENT:  Head: Normocephalic and atraumatic.  Eyes: Conjunctivae are normal. Right eye exhibits no discharge. Left eye exhibits no discharge. No scleral icterus.  Neck: Normal range of motion.  Cardiovascular: Normal rate and regular rhythm.  Respiratory: Effort normal. No respiratory distress.  Musculoskeletal:     Comments: Right shoulder, elbow, wrist, digits- no skin wounds, fluctuant area radial wrist extending into distal volar forearm, no TTP at wrist, mod TTP forearm, no pain with AROM/PROM of wrist, no instability, no blocks to motion  Sens  Ax/R/M/U intact  Mot   Ax/ R/ PIN/ M/ AIN/ U intact  Rad 2+  Neurological: She is alert.  Skin: Skin is warm and dry. She is not diaphoretic.  Psychiatric: She has a normal mood and  affect. Her behavior is normal.    Assessment/Plan: Right wrist abscess -- Despite the MRI report it's hard to imagine this abscess involves the wrist joint itself. There may very well be another etiology for this fluid but given the forearm tenderness it's probably still worth operative I&D and fluid analysis. Will have pt transferred to [facility] for I&D this evening by Dr. Melvyn Novas. Please keep NPO. Multiple medical problems including hypertension, diastolic heart failure, diabetes, seizure, and prior stroke -- per primary service   The patient was seen and examined in the holding area tonight.  Clinically the patient did not have an abscess to the dorsal aspect of the wrist.  She has had swelling over her wrist for over years with her rheumatoid arthritis.  The patient's area of concern was aspirated.  There was no purulence to the aspiration this was a serous sanguinous and crystal-like material consistent with the inflammatory arthropathy that the patient has.  I do not recommend incision and drainage of the area.  I do not believe this is a source of infection.  The patient most importantly was not having any pain.  Clinically she did this was not consistent with an abscess.  Patient tolerated the aspiration.  She will be sent back to [facility].  Short arm wrist splint for comfort.  Mazi Schuff Melvyn Novas MD 12/14/2018   Freeman Caldron, PA-C Orthopedic Surgery [phone] 12/14/2018, 1:05 PM

## 2018-12-14 NOTE — Progress Notes (Signed)
Patient has bed and auth for [employer].   LCSW will assist with dc when patient is medically stable.  Beulah Gandy [name] CSW [phone]

## 2018-12-14 NOTE — Progress Notes (Signed)
PROGRESS NOTE   [name] [name]  PPI:[id number]    DOB: 09/10/1930    DOA: 12/11/2018  PCP: Briscoe Deutscher, DO   I have briefly reviewed patients previous medical records in [facility]  Brief Narrative:  85 year old female with PMH of rheumatoid arthritis on prednisone and other immunosuppressants, DVT/PE on Xarelto, gout, DM 2, chronic combined systolic and diastolic CHF, CVA, iron deficiency anemia, HTN, nonobstructive CAD, hospitalized 11/14/2018-11/17/2018 for low back pain due to severe spinal stenosis for which he is awaiting surgery, fever attributed to RA flare, altered mental status, suspected rheumatoid arthritis flare, was discharged from SNF on 12/12/2018, presented to ED on 12/13/2018 due to ongoing high fevers up to 102.7 F, mild hypoxia to 88%, generalized weakness and especially of her lower extremities to an extent where she is having difficulty walking, right upper extremity swelling of 1 day duration and associated tenderness.  Initially admitted to [facility] for fever of unknown etiology, subsequent MRI of right hand confirmed abscess.  Orthopedics/hand surgery/hand surgery consulted and recommended transfer to [facility] for possible surgery 2/5.   Assessment & Plan:   Active Problems:   Type 2 diabetes mellitus with hyperlipidemia ([place]), patient declines statin   Hypertension associated with diabetes ([place])   CAD (coronary artery disease)   Rheumatoid arthritis involving multiple sites ([place])   CKD stage 3 due to type 2 diabetes mellitus (HCC)   Chronic combined systolic and diastolic CHF (congestive heart failure) (HCC)   Deep vein thrombosis (DVT) of distal vein of right lower extremity (HCC)   Fever   Abscess of right upper extremity   1. Fever/abscess and cellulitis of right distal forearm: Patient does have underlying rheumatoid arthritis and is on prednisone and other immunosuppressants.  MRI results as below confirm abscess and cellulitis.  Urine culture  negative.  ESR 55.  Rheumatoid factor: 22.  Influenza panel PCR negative.  UA not suggestive of UTI.  Chest x-ray without pneumonia.  Blood cultures x2: Pending.  Empirically started on IV cefepime and vancomycin, continue.  I consulted orthopedic/hand surgery who evaluated patient and recommend transferring patient to [facility] for possible I&D later on 2/5.  Discontinued Xarelto for now (last dose on 2/4 at 5:59 PM).  Based on available data, patient is at moderate risk for perioperative CV events but may proceed with procedure with close perioperative monitoring without any further cardiac work-up. 2. Rheumatoid arthritis: No clinical flare.  Patient usually on prednisone 10 mg daily at home.  This had been increased to 40 mg daily for suspected flare.  Will reduce to 20 mg daily given acute illness.  Continue Plaquenil.  [place].  Uric acid normal. 3. Acute respiratory failure with hypoxia: Unclear etiology.  Chest x-ray negative.  No respiratory symptoms reported.  Resolved.  Continue to monitor closely. 4. Chronic combined systolic and diastolic CHF: Compensated.  Continue Lasix, Entresto, Imdur and carvedilol.  TTE 08/30/2018: LVEF 06-30%, grade 1 diastolic dysfunction.  Left and right cardiac cath 06/01/2018: No aortic valve stenosis, moderate pulmonary hypertension and nonobstructive CAD. 5. History of DVT/PE: Xarelto held for upcoming I&D.  Resume postprocedure when okay with hand surgeons.  CTA chest 9/15 had shown acute PE.  Lower extremity venous Dopplers 07/25/2018 had shown acute right peroneal vein DVT.?  Duration of anticoagulation. 6. Acute toxic metabolic encephalopathy: Likely related to acute illness complicating underlying dementia.  No focal deficits.  Delirium precautions.  Seems to have improved compared to admission. 7. Severe spinal stenosis: Reportedly seen  by Dr. Gladstone Lighter last week and he recommended surgery.  She will need to follow-up outpatient after current acute illness to further  manage this.  No overt neurological deficits noted in lower extremities or sphincter problems. 8. Iron deficiency anemia: Stable.  Follow CBCs daily. 9. Essential hypertension: Controlled. 10. DM2: Suspect diet controlled.  A1c 5.9 on 06/25/2018.   DVT prophylaxis: Patient received last dose of Xarelto on 2/4 at 5:59 PM.  Xarelto now discontinued for right upper extremity abscess I&D supposed to be for 2/5 evening.  Resume Xarelto when cleared by orthopedic/hand surgery. Code Status: Full Family Communication: Discussed with daughter at bedside, updated care and answered questions. Disposition: Initially admitted to [facility].  Transferred to [facility] on 2/5.   Consultants:  Orthopedic  Procedures:  None  Antimicrobials:  Review vancomycin and cefepime 2/4 >   Subjective: Patient is a poor historian due to mental status changes from underlying dementia.  Denies right upper extremity pain but has some swelling.  States that she usually takes prednisone 10 mg daily.  As per daughter at bedside, generalized weakness even prior to discharge from SNF and 2 people had difficulty to stand her up at home.  No chest pain, dyspnea or palpitations reported.  No dysuria.  ROS: As above.  Objective:  Vitals:   12/13/18 1518 12/13/18 1525 12/13/18 2045 12/14/18 0449  BP: 138/84  (!) 159/76 129/65  Pulse: 85  84 66  Resp: _0 Temp: 100.1 F (37.8 C)   (!) 97.3 F (36.3 C)  TempSrc: Oral   Oral  SpO2: 96%  98% 99%  Weight:  78 kg    Height:  _1  (1.626 m)      Examination:  General exam: Pleasant elderly female, moderately built and overweight lying comfortably propped up in bed.  Oral mucosa moist. Respiratory system: Clear to auscultation. Respiratory effort normal. Cardiovascular system: S1 & S2 heard, RRR. No JVD, murmurs, rubs, gallops or clicks. No pedal edema.  Telemetry personally reviewed: Sinus rhythm with occasional PVCs. Gastrointestinal system: Abdomen is  nondistended, soft and nontender. No organomegaly or masses felt. Normal bowel sounds heard. Central nervous system: Alert and oriented to person and partly to place. No focal neurological deficits. Extremities: Symmetric at least 4+ by 5 power in all extremities.  RA changes noted in both wrists and fingers.  Right upper extremity diffusely mildly swollen up to elbow with mild increased warmth but no erythema or tenderness.  No crepitus. Skin: No rashes, lesions or ulcers Psychiatry: Judgement and insight impaired. Mood & affect appropriate.     Data Reviewed: I have personally reviewed following labs and imaging studies  CBC: Recent Labs  Lab 12/08/18 12/11/18 1103 12/13/18 0845 12/14/18 0613  WBC 6.3 6.1 5.3 4.9  NEUTROABS  --  3.6  --   --   HGB 8.7* 9.2* 8.5* 8.3*  HCT 26* 29.0* 27.5* 26.0*  MCV  --  98.6 97.5 96.3  PLT 269 298 239 354   Basic Metabolic Panel: Recent Labs  Lab 12/08/18 12/11/18 1103 12/13/18 0845 12/14/18 0613  NA 141 136 134* 134*  K 3.8 3.7 3.5 3.7  CL  --  103 102 104  CO2  --  _2 GLUCOSE  --  99 109* 178*  BUN _3 CREATININE 1.0 0.98 1.15* 1.00  CALCIUM  --  8.4* 8.1* 7.9*   Liver Function Tests: Recent Labs  Lab 12/11/18 1103 12/13/18  0845 12/14/18 0613  AST _0 ALT _1 ALKPHOS 42 36* 39  BILITOT 0.9 0.5 0.3  PROT 6.1* 5.5* 5.5*  ALBUMIN 3.1* 2.6* 2.4*   CBG: Recent Labs  Lab 12/11/18 0956  GLUCAP 89    Recent Results (from the past 240 hour(s))  Urine culture     Status: None   Collection Time: 12/13/18 11:11 AM  Result Value Ref Range Status   Specimen Description   Final    URINE, RANDOM Performed at [facility], [place] [address] [place], [place] [address]    Special Requests   Final    NONE Performed at [facility] [place] [address] [place], [place] [address]    Culture   Final    NO GROWTH Performed at [facility], [place] [address],  [place], [place] [address]    Report Status 12/14/2018 FINAL  Final         Radiology Studies: Dg Chest 2 View  Result Date: 12/13/2018 CLINICAL DATA:  Fever, hypoxia. EXAM: CHEST - 2 VIEW COMPARISON:  Radiograph of November 15, 2018 FINDINGS: Stable cardiomegaly. No pneumothorax or pleural effusion is noted. Both lungs are clear. The visualized skeletal structures are unremarkable. IMPRESSION: No active cardiopulmonary disease. Electronically Signed   By: Marijo Conception, [name]   On: 12/13/2018 09:51   Mr Hand Right W Wo Contrast  Result Date: 12/13/2018 CLINICAL DATA:  Hand swelling, cellulitis EXAM: MRI OF THE RIGHT HAND WITHOUT AND WITH CONTRAST TECHNIQUE: Multiplanar, multisequence MR imaging of the right hand was performed before and after the administration of intravenous contrast. CONTRAST:  7.5 mL Gadavist COMPARISON:  None. FINDINGS: Bones/Joint/Cartilage No acute fracture or dislocation. Scapholunate widening consistent with a scapholunate ligament tear with proximal migration of the capitate consistent with a SLAC wrist. Severe carpal synovitis. Multiple erosions involving the carpal bones. Carpal crowding. Erosion of the distal radius. Erosion of the first metacarpal head. Small joint effusion and synovitis of the second, third, fourth and fifth MCP joints. Mild osteoarthritis of the first Snead joint. Ligaments Collateral ligaments are intact. Muscles and Tendons Muscles are normal. No muscle atrophy. Flexor and extensor compartment tendons are intact. Soft tissue Soft tissue edema with enhancement along the radial aspect of the distal forearm and wrist. Complex peripherally enhancing fluid collection measuring approximately 1.8 x 4.2 x 6.9 cm along the dorsal radial aspect of the forearm and wrist most concerning for an abscess. No soft tissue mass. IMPRESSION: 1. Cellulitis of the distal radial aspect of forearm and wrist. 1.8 x 4.2 x 6.9 cm peripherally enhancing fluid collection along the dorsal  radial aspect of the distal forearm and wrist most concerning for an abscess. The fluid collection extends into the distal forearm which is outside of the field of view limiting full characterization of the extent of fluid collection. 2. Erosive changes of the carpus with carpal crowding and diffuse carpal synovitis as can be seen with an inflammatory arthropathy such as rheumatoid arthritis. 3. Small joint effusion and synovitis of the second, third, fourth and fifth MCP joints. 4. SLAC wrist. Electronically Signed   By: Kathreen Devoid   On: 12/13/2018 20:48        Scheduled Meds: . allopurinol  100 mg Oral Daily  . carvedilol  25 mg Oral BID WC  . cholecalciferol  1,000 Units Oral Daily  . docusate sodium  100 mg Oral BID  . feeding supplement (ENSURE ENLIVE)  237 mL Oral QPM  . furosemide  20 mg Oral Daily  . gabapentin  400 mg Oral TID  . hydrALAZINE  100 mg Oral TID  . HYDROcodone-acetaminophen  1 tablet Oral Q6H  . hydroxychloroquine  200 mg Oral BID  . isosorbide mononitrate  30 mg Oral QHS  . Melatonin  1 tablet Oral QHS  . olopatadine  1 drop Both Eyes BID  . polyvinyl alcohol  1 drop Both Eyes Daily  . [START ON 12/15/2018] predniSONE  20 mg Oral Q breakfast  . sacubitril-valsartan  1 tablet Oral Q1200  . senna  1 tablet Oral BID  . vitamin C  500 mg Oral Daily   Continuous Infusions: . ceFEPime (MAXIPIME) IV 1 g (12/14/18 0934)  . vancomycin 1,250 mg (12/13/18 2128)     LOS: 0 days     Vernell Leep, MD, FACP, [facility]. [facility]  To contact the attending provider between 7A-7P or the covering provider during after hours 7P-7A, please log into the web site [email or web address] and access using universal [place] password for that web site. If you do not have the password, please call the hospital operator.  12/14/2018, 1:23 PM

## 2018-12-14 NOTE — Progress Notes (Signed)
Physical Therapy Treatment Patient Details Name: [name] CluckHazel C Osmundson MRN: [id number] DOB: [birth year] Today's Date: 12/14/2018    History of Present Illness [name] [name] is a 85 y.o. female with history of rheumatoid arthritis, gout, diabetes mellitus type 2, diastolic CHF, PE on Xarelto, history of CVA, iron deficiency, hypertension, nonobstructive CAD was recently admitted last month for low back pain at the time MRI of the lumbar spine showed degenerative disc disease and patient was eventually discharged to rehab , [facility]. Per family, patient unable to ambulate after DC.     PT Comments    Pt much more awake and alert this session, and motivated to progress mobility. Pt limited in mobility progression by LE and hip extensor/flexor weakness (which pt states R>L but via MMT testing strength roughly equal side to side), deconditioning, and mild R hand pain. Pt required mod assist +2 for transfer to standing today, and pt unable to transfer due to recliner today due to suspected anxiety as well as weakness. PT to continue to progress mobility as tolerated by pt.    Follow Up Recommendations  SNF     Equipment Recommendations  None recommended by PT    Recommendations for Other Services       Precautions / Restrictions Precautions Precautions: Fall Precaution Comments: generalized pain in joints Restrictions Weight Bearing Restrictions: No Other Position/Activity Restrictions: Pt with R hand swelling and mild pain, no WB status documented in chart but pt with very light use of RUE during PT as a conservative measure     Mobility  Bed Mobility Overal bed mobility: Needs Assistance Bed Mobility: Supine to Sit;Sit to Supine;Rolling Rolling: Min assist   Supine to sit: Min assist;HOB elevated Sit to supine: Mod assist;HOB elevated;+2 for physical assistance   General bed mobility comments: Min assist for supine to sit for LE lowering once dangling, scoot assist with use  of bed pad, trunk elevation off of bed. Pt with good initiation of movement and performance of LE movement. Mod assist +2 for return to supine for bilateral LE management, trunk lowering, and scooting pt up in bed. Min assist for trunk translation during rolling to place bed pad upon return to bed   Transfers Overall transfer level: Needs assistance Equipment used: Rolling walker (2 wheeled) Transfers: Sit to/from Stand Sit to Stand: Mod assist;From elevated surface;+2 physical assistance;+2 safety/equipment         General transfer comment: Mod assist for power up, hip extension via PT facilitation, and steadying upon standing. Pt able to tolerate standing ~10 seconds before needing seated rest break. Attempted pre-gait weight shift and stepping, but pt unable to perform with proper posture. Sit to stand x3 for LE strengthening, emphasis on hip extension and posture piece. VC for placement of hands for power up (LUE moreso than RUE due to painful R hand), placement of feet to promote power up. PT attempted to perform squat pivot with pt towards recliner, but pt with difficulty scooting, R>L. Drop arm recliner only dropped on L side, and pt unable to effectively scoot towards R even with PT assisting via trunk rocking on gluteal offloading.   Ambulation/Gait Ambulation/Gait assistance: (NT - pt struggled with STS)               Stairs             Wheelchair Mobility    Modified Rankin (Stroke Patients Only)       Balance Overall balance assessment: Needs assistance Sitting-balance  support: No upper extremity supported;Feet supported Sitting balance-Leahy Scale: Fair Sitting balance - Comments: able to sit EOB without UE or PT support. Pt sat EOB ~5 minutes without difficulty   Standing balance support: Bilateral upper extremity supported Standing balance-Leahy Scale: Poor Standing balance comment: relies on RW and PT/PT cues in order to maintain balance                              Cognition Arousal/Alertness: Awake/alert Behavior During Therapy: WFL for tasks assessed/performed Overall Cognitive Status: Within Functional Limits for tasks assessed                                 General Comments: Pt awake, interactive, oriented to self and situation today. Pt playing active role in PT session today, no slurred speech noted today.       Exercises Total Joint Exercises Bridges: Barbaraann Boys;Both;5 reps;Supine General Exercises - Lower Extremity Long Arc Quad: AROM;Both;Seated(2 reps of each) Hip Flexion/Marching: AROM;Both;5 reps;Seated Mini-Sqauts: AAROM;Both;Seated;Standing(sit to stands, 3 reps 1 set, for strengthening, limited by fatigue)    General Comments General comments (skin integrity, edema, etc.): Strength via MMT - RLE: hip flexion 3/5, hip extension 2/5, knee extension 3+/5. LLE:  hip flexion 3/5, hip extension 2/5, knee extension 3/5.      Pertinent Vitals/Pain Pain Assessment: Faces Faces Pain Scale: Hurts a little bit Pain Location: R wrist and hand  Pain Descriptors / Indicators: Discomfort Pain Intervention(s): Limited activity within patient's tolerance;Repositioned;Monitored during session    Home Living                      Prior Function            PT Goals (current goals can now be found in the care plan section) Acute Rehab PT Goals Patient Stated Goal: none stated  PT Goal Formulation: With family Time For Goal Achievement: 12/26/18 Potential to Achieve Goals: Fair Progress towards PT goals: Progressing toward goals    Frequency    Min 2X/week      PT Plan Current plan remains appropriate    Co-evaluation              AM-PAC PT "6 Clicks" Mobility   Outcome Measure  Help needed turning from your back to your side while in a flat bed without using bedrails?: A Little Help needed moving from lying on your back to sitting on the side of a flat bed without using  bedrails?: A Lot Help needed moving to and from a bed to a chair (including a wheelchair)?: A Lot Help needed standing up from a chair using your arms (e.g., wheelchair or bedside chair)?: A Lot Help needed to walk in hospital room?: Total Help needed climbing 3-5 steps with a railing? : Total 6 Click Score: 11    End of Session Equipment Utilized During Treatment: Gait belt Activity Tolerance: Patient tolerated treatment well;Patient limited by fatigue Patient left: in bed;with call bell/phone within reach;with family/visitor present;with bed alarm set Nurse Communication: Mobility status PT Visit Diagnosis: Other abnormalities of gait and mobility (R26.89);Muscle weakness (generalized) (M62.81)     Time: 3794-3276 PT Time Calculation (min) (ACUTE ONLY): 34 min  Charges:  $Therapeutic Exercise: 8-22 mins $Therapeutic Activity: 8-22 mins  Nicola Police, PT Acute Rehabilitation Services Pager [phone]  Office [phone]   Tyrone Apple D Despina Hidden 12/14/2018, 1:29 PM

## 2018-12-14 NOTE — Progress Notes (Signed)
Report called to 5NKatina Degree RN

## 2018-12-14 NOTE — Progress Notes (Signed)
Pt transferred to [facility] short stay from [facility] for surgery. Pt does not need surgery per Dr. Melvyn Novas, requested pt be transferred back to [facility]. Dr. Waymon Amato called and made aware, states patient should stay at [facility], he has already reported off. Patient and family agreeable to plan. Awaiting bed.

## 2018-12-15 ENCOUNTER — Ambulatory Visit: Payer: Medicare Other | Admitting: Cardiology

## 2018-12-15 DIAGNOSIS — I2609 Other pulmonary embolism with acute cor pulmonale: Secondary | ICD-10-CM

## 2018-12-15 DIAGNOSIS — R509 Fever, unspecified: Secondary | ICD-10-CM

## 2018-12-15 DIAGNOSIS — Z01818 Encounter for other preprocedural examination: Secondary | ICD-10-CM

## 2018-12-15 DIAGNOSIS — I2782 Chronic pulmonary embolism: Secondary | ICD-10-CM

## 2018-12-15 DIAGNOSIS — I825Z1 Chronic embolism and thrombosis of unspecified deep veins of right distal lower extremity: Secondary | ICD-10-CM

## 2018-12-15 DIAGNOSIS — I2510 Atherosclerotic heart disease of native coronary artery without angina pectoris: Secondary | ICD-10-CM

## 2018-12-15 LAB — CBC
HCT: 24.2 % — ABNORMAL LOW (ref 36.0–46.0)
Hemoglobin: 7.9 g/dL — ABNORMAL LOW (ref 12.0–15.0)
MCH: 29.9 pg (ref 26.0–34.0)
MCHC: 32.6 g/dL (ref 30.0–36.0)
MCV: 91.7 fL (ref 80.0–100.0)
Platelets: 249 10*3/uL (ref 150–400)
RBC: 2.64 MIL/uL — ABNORMAL LOW (ref 3.87–5.11)
RDW: 16.1 % — ABNORMAL HIGH (ref 11.5–15.5)
WBC: 6.7 10*3/uL (ref 4.0–10.5)
nRBC: 0 % (ref 0.0–0.2)

## 2018-12-15 LAB — PROCALCITONIN: Procalcitonin: 0.15 ng/mL

## 2018-12-15 MED ORDER — RIVAROXABAN 20 MG PO TABS
20.0000 mg | ORAL_TABLET | Freq: Every day | ORAL | Status: DC
  Administered 2018-12-15 – 2018-12-20 (×6): 20 mg via ORAL
  Filled 2018-12-15 (×6): qty 1

## 2018-12-15 NOTE — Progress Notes (Addendum)
[facility] PROGRESS NOTE  [name] [name] GMW:[id number] DOB: 1930/08/01 DOA: 12/11/2018 PCP: Briscoe Deutscher, DO  Assessment/Plan: 1. Persistent fever, improving.  Has remained afebrile for the last 24 hours.  Currently on empiric antibiotics due to edition concern for potential wrist abscess.  Suspect this may be more likely related to her autoimmune disease (RA) as patient does report she seems like she is having pain consistent with her typical flares, will continue antibiotics and follow procalcitonin if negative will increase prednisone, for now continue empiric cefepime.  Discontinue vancomycin given no MRSA after 2 days of cultures.  Encouraged by no localizing signs or symptoms of infection, no white count, unremarkable CXR, wnl ua, negative blood cultures to date.  2. Enhancing fluid collection of right wrist.  Presumed to be abscess based off MR imaging.  Dr. Caralyn Guile with surgery evaluated at bedside with serosanguinous fluid on aspiration and crystal-like material consistent with inflammatory arhtopathy.  Patient reports this nodule has been present for over a year related to her known RA disease. Clinically this does not seem to be an abscess it is not tender, there is no erythema and noted aspiration of fluid findings. We will continue antibiotics for now until true infection ruled out with pro-Cal and continue monitoring of her blood cultures.  3. Right extremity weakness, subacute. Has been ongoing since January per documentation. Noted improvement with therapy at rehab facility. Has spinal stenosis but no claudication or urine/bowel incontinence to suggest it is worsening. Has strength on exam but less on right side. PT eval.    4. Chronic combined systolic/diastolic heart failure.  Euvolemic on exam.  TTE, 08/2018 with EF 60-65% (improved from 07/2018 with EF of 35-40%), grade 1 diastolic dysfunction.  Continue home Lasix 20 mg daily, Entresto, Coreg Daily weights, strict I's and  O's  5. Rheumatoid arthritis, suspect acute flare given persistent fevers and aches, ESR elevated ( not specific).  Continue home Plaquenil.  Hold leflunomide while ruling out infection currently on reduced dose of prednisone given ruling out active infection would like to increase if infection ruled out.  6. Hypertension, stable.  Continue home hydralazine, Coreg  7. History of right lower lobe PE and Right DVT (07/2018).  Normal respiratory exam, no SOB. Continue home Xarelto, initial plan to continue for at least 6 months before considering discontinuation (CTA on 07/2018 showed small  PE in right lower lobe, repeat CTA on 08/2018 showed resolution)  8. Gout, stable.  Continue home allopurinol  9. Lumbar Spinal stenosis of L4-L5, L5-S1. No claudication present. No bowel/urinary incontinence.  Diagnosed on 10/2018 with no signs of cord compression and repeat MRI from 11/2018 showed no changes.  Dr. Gladstone Lighter planning for decompressive laminectomy as outpatient, but she will need to be off xarelto for at least a week per Dr. Gladstone Lighter prior to any surgery. Will discuss with cardiology for further clarification, may warrant discussing with NSG while inpatient if right leg weakness worsens while here and is not attributable to potential RA flare.   10. T2DM, not on insulin.  A1c 5.9% 06/2018 CBG monitoring, SSI, diabetic diet  11. CKD, stage III.  GFR range 43-51.  Creatinine stable at baseline 0.9-1.1.  Avoid nephrotoxins, monitor BMP.  12. Anemia of chronic disease. Likely related to RA (iron panel from 10/2018 shows low iron, normal ferritin, hgb stable, daily CBC  13. CAD. MI in 1970a. Nonobstructive on cath in 2019.No chest pain. Continue imdur and carvedilol  14. Pain/psych. Continue home gabapentin  Code  Status: FULL Family Communication: left voice message for [name] at [phone] Disposition Plan: procalcitonin trend, IV antibiotics, monitor blood cultures, monitor for  fever   Consultants:  Hand surgery(dr. Caralyn Guile)  Procedures:  Bedside aspiration of right wrist nodule- consistent with crystal like material, sero sanguinous fluid  Antibiotics:  IV vancomycin, 2/4-2/5  IV cefepime 2/4-  HPI/Subjective:  [name] [name] is a 85 y.o. year old female with medical history significant for combined systolic/diastolic CHF, CAD, PE on xarelto, diabetes,  RA on leflunomide, Plaquenil, and prednisone, prior stroke, HTN, gout, severe lumbar spinal stenosis who presented on 12/11/2018 with reports of weakness to right leg and difficulty ambulating/transferring per family after recent discharge from skilled nursing facility and was found to have decreased ability to bear weight on right leg and reported fever. She was discharged from her SNF on Saturday and reports difficulty transferring for several weeks due to decreased ability to bear weight on right leg ( in SNF she was transferred to wheelchair for ambulation). Denies any urine/bowel incontinence. She does not recall any fevers. Denies any localizing symptoms other than mild cough. Reports adherence to all her medications, states her last weight was in the 170s prior to discharge from the SNF. No pain in her right wrist Transferred from [facility] for surgical debridement of presumed right wrist abscess as potential etiology for recurrent fevers. Evaluated at bedside on 12/14/18 with aspration of serosanguineous fluid with no concern for actual infection/abscess. Found to have a chronic longstanding nodule related to her RA  Reports some right hip and behind knee pain similar to her typical RA flare States swelling on right wrist has been present for over a year and was reportedly a nodule.   Objective: Vitals:   12/14/18 2007 12/15/18 0509  BP: (!) 144/69 (!) 144/75  Pulse: 74 72  Resp:  16  Temp: 98.7 F (37.1 C) 97.8 F (36.6 C)  SpO2: 98% 100%    Intake/Output Summary (Last 24 hours) at 12/15/2018  0848 Last data filed at 12/15/2018 0500 Gross per 24 hour  Intake 321.5 ml  Output 1350 ml  Net -1028.5 ml   Filed Weights   12/11/18 0952 12/13/18 1525  Weight: 78 kg 78 kg    Exam:   General:  Lying in bed in no distress  Cardiovascular: RRR, scant edema in bilateral lower extremities ( non-pitting),  Respiratory: normal respiratory effort on room air, no crackles or wheezing  Abdomen: soft, obese abdomen  Musculoskeletal: limited ROM of right leg  Skin right wrist with palpable nodule, nontender with no surrounding erythema or fluctuance  Neurologic decreased strength in right leg but able to lift against resistance, sensation intact  Data Reviewed: Basic Metabolic Panel: Recent Labs  Lab 12/11/18 1103 12/13/18 0845 12/14/18 0613  NA 136 134* 134*  K 3.7 3.5 3.7  CL 103 102 104  CO2 _0 GLUCOSE 99 109* 178*  BUN _1 CREATININE 0.98 1.15* 1.00  CALCIUM 8.4* 8.1* 7.9*   Liver Function Tests: Recent Labs  Lab 12/11/18 1103 12/13/18 0845 12/14/18 0613  AST _2 ALT _3 ALKPHOS 42 36* 39  BILITOT 0.9 0.5 0.3  PROT 6.1* 5.5* 5.5*  ALBUMIN 3.1* 2.6* 2.4*   No results for input(s): LIPASE, AMYLASE in the last 168 hours. No results for input(s): AMMONIA in the last 168 hours. CBC: Recent Labs  Lab 12/11/18 1103 12/13/18 0845 12/14/18 7416 12/15/18 3845  WBC 6.1 5.3 4.9 6.7  NEUTROABS 3.6  --   --   --   HGB 9.2* 8.5* 8.3* 7.9*  HCT 29.0* 27.5* 26.0* 24.2*  MCV 98.6 97.5 96.3 91.7  PLT 298 239 229 249   Cardiac Enzymes: No results for input(s): CKTOTAL, CKMB, CKMBINDEX, TROPONINI in the last 168 hours. BNP (last 3 results) Recent Labs    05/28/18 1614 06/22/18 2153 07/23/18 2100  BNP 2,077.6* 1,146.7* 138.3*    ProBNP (last 3 results) No results for input(s): PROBNP in the last 8760 hours.  CBG: Recent Labs  Lab 12/11/18 0956 12/14/18 1714  GLUCAP 89 149*    Recent Results (from the past 240 hour(s))  Blood  culture (routine x 2)     Status: None (Preliminary result)   Collection Time: 12/13/18  8:43 AM  Result Value Ref Range Status   Specimen Description   Final    BLOOD RIGHT ARM Performed at [facility] [address] [place], [place] [address]    Special Requests   Final    BOTTLES DRAWN AEROBIC AND ANAEROBIC Blood Culture adequate volume Performed at [facility] [address], [place], [place] [address]    Culture   Final    NO GROWTH 1 DAY Performed at [facility], [place] [address], [place], [place] [address]    Report Status PENDING  Incomplete  Blood culture (routine x 2)     Status: None (Preliminary result)   Collection Time: 12/13/18  8:45 AM  Result Value Ref Range Status   Specimen Description   Final    BLOOD LEFT HAND Performed at [facility] [address] [place], [place] [address]    Special Requests   Final    BOTTLES DRAWN AEROBIC AND ANAEROBIC BCAV Performed at [facility], [place] [address], [place], [place] [address]    Culture   Final    NO GROWTH 1 DAY Performed at [facility], [place] [address], [place], [place] [address]    Report Status PENDING  Incomplete  Urine culture     Status: None   Collection Time: 12/13/18 11:11 AM  Result Value Ref Range Status   Specimen Description   Final    URINE, RANDOM Performed at [facility] [address] [place], [place] [address]    Special Requests   Final    NONE Performed at [facility], [place] [address], [place], [place] [address]    Culture   Final    NO GROWTH Performed at [facility], [place] [address], [place], [place] [address]    Report Status 12/14/2018 FINAL  Final  Surgical pcr screen     Status: None   Collection Time: 12/14/18  1:40 PM  Result Value Ref Range Status   MRSA, PCR NEGATIVE NEGATIVE Final   Staphylococcus aureus NEGATIVE NEGATIVE Final     Comment: (NOTE) The Xpert SA Assay (FDA approved for NASAL specimens in patients 47 years of age and older), is one component of a comprehensive surveillance program. It is not intended to diagnose infection nor to guide or monitor treatment. Performed at [facility], [place] [address], [place],  [address]      Studies: Dg Chest 2 View  Result Date: 12/13/2018 CLINICAL DATA:  Fever, hypoxia. EXAM: CHEST - 2 VIEW COMPARISON:  Radiograph of November 15, 2018 FINDINGS: Stable cardiomegaly. No pneumothorax or pleural  effusion is noted. Both lungs are clear. The visualized skeletal structures are unremarkable. IMPRESSION: No active cardiopulmonary disease. Electronically Signed   By: Marijo Conception, [name]   On: 12/13/2018 09:51   Mr Hand Right W Wo Contrast  Result Date: 12/13/2018 CLINICAL DATA:  Hand swelling, cellulitis EXAM: MRI OF THE RIGHT HAND WITHOUT AND WITH CONTRAST TECHNIQUE: Multiplanar, multisequence MR imaging of the right hand was performed before and after the administration of intravenous contrast. CONTRAST:  7.5 mL Gadavist COMPARISON:  None. FINDINGS: Bones/Joint/Cartilage No acute fracture or dislocation. Scapholunate widening consistent with a scapholunate ligament tear with proximal migration of the capitate consistent with a SLAC wrist. Severe carpal synovitis. Multiple erosions involving the carpal bones. Carpal crowding. Erosion of the distal radius. Erosion of the first metacarpal head. Small joint effusion and synovitis of the second, third, fourth and fifth MCP joints. Mild osteoarthritis of the first [place] joint. Ligaments Collateral ligaments are intact. Muscles and Tendons Muscles are normal. No muscle atrophy. Flexor and extensor compartment tendons are intact. Soft tissue Soft tissue edema with enhancement along the radial aspect of the distal forearm and wrist. Complex peripherally enhancing fluid collection measuring approximately 1.8 x 4.2 x 6.9 cm  along the dorsal radial aspect of the forearm and wrist most concerning for an abscess. No soft tissue mass. IMPRESSION: 1. Cellulitis of the distal radial aspect of forearm and wrist. 1.8 x 4.2 x 6.9 cm peripherally enhancing fluid collection along the dorsal radial aspect of the distal forearm and wrist most concerning for an abscess. The fluid collection extends into the distal forearm which is outside of the field of view limiting full characterization of the extent of fluid collection. 2. Erosive changes of the carpus with carpal crowding and diffuse carpal synovitis as can be seen with an inflammatory arthropathy such as rheumatoid arthritis. 3. Small joint effusion and synovitis of the second, third, fourth and fifth MCP joints. 4. SLAC wrist. Electronically Signed   By: Kathreen Devoid   On: 12/13/2018 20:48    Scheduled Meds: . allopurinol  100 mg Oral Daily  . carvedilol  25 mg Oral BID WC  . cholecalciferol  1,000 Units Oral Daily  . docusate sodium  100 mg Oral BID  . feeding supplement (ENSURE ENLIVE)  237 mL Oral QPM  . furosemide  20 mg Oral Daily  . gabapentin  400 mg Oral TID  . hydrALAZINE  100 mg Oral TID  . HYDROcodone-acetaminophen  1 tablet Oral Q6H  . hydroxychloroquine  200 mg Oral BID  . isosorbide mononitrate  30 mg Oral QHS  . Melatonin  1 tablet Oral QHS  . olopatadine  1 drop Both Eyes BID  . polyvinyl alcohol  1 drop Both Eyes Daily  . predniSONE  20 mg Oral Q breakfast  . sacubitril-valsartan  1 tablet Oral Q1200  . senna  1 tablet Oral BID  . vitamin C  500 mg Oral Daily   Continuous Infusions: . ceFEPime (MAXIPIME) IV Stopped (12/15/18 0035)  . vancomycin 1,250 mg (12/13/18 2128)    Active Problems:   Type 2 diabetes mellitus with hyperlipidemia ([place]), patient declines statin   Hypertension associated with diabetes ([place])   CAD (coronary artery disease)   Rheumatoid arthritis involving multiple sites [facility])   CKD stage 3 due to type 2 diabetes mellitus  ([place])   Chronic combined systolic and diastolic CHF (congestive heart failure) ([place])   Deep vein thrombosis (DVT) of distal vein of right lower extremity (  [place])   Fever   Abscess of right upper extremity      Desiree Hane  [facility]

## 2018-12-15 NOTE — Telephone Encounter (Signed)
Pharm please address xarelto  

## 2018-12-15 NOTE — Telephone Encounter (Signed)
Pt currently admitted.   Pt does take Xarelto for history of VTE. It appears this has been managed by PCP. Will defer to PCP for clearance of anticoagulation.

## 2018-12-15 NOTE — Discharge Instructions (Signed)
Information on my medicine - XARELTO (rivaroxaban)  WHY WAS XARELTO PRESCRIBED FOR YOU? Xarelto was prescribed to you due to a history of blood clots.  What do you need to know about Xarelto? Continue taking one 20 mg tablet ONCE A DAY with your evening meal.  DO NOT stop taking Xarelto without talking to the health care provider who prescribed the medication.  Refill your prescription for 20 mg tablets before you run out.  After discharge, you should have regular check-up appointments with your healthcare provider that is prescribing your Xarelto.  In the future your dose may need to be changed if your kidney function changes by a significant amount.  What do you do if you miss a dose? If you are taking Xarelto TWICE DAILY and you miss a dose, take it as soon as you remember. You may take two 15 mg tablets (total 30 mg) at the same time then resume your regularly scheduled 15 mg twice daily the next day.  If you are taking Xarelto ONCE DAILY and you miss a dose, take it as soon as you remember on the same day then continue your regularly scheduled once daily regimen the next day. Do not take two doses of Xarelto at the same time.   Important Safety Information Xarelto is a blood thinner medicine that can cause bleeding. You should call your healthcare provider right away if you experience any of the following: ? Bleeding from an injury or your nose that does not stop. ? Unusual colored urine (red or dark brown) or unusual colored stools (red or black). ? Unusual bruising for unknown reasons. ? A serious fall or if you hit your head (even if there is no bleeding).  Some medicines may interact with Xarelto and might increase your risk of bleeding while on Xarelto. To help avoid this, consult your healthcare provider or pharmacist prior to using any new prescription or non-prescription medications, including herbals, vitamins, non-steroidal anti-inflammatory drugs (NSAIDs) and  supplements.  This website has more information on Xarelto: VisitDestination.com.br.

## 2018-12-15 NOTE — Consult Note (Signed)
Cardiology Consultation:   Patient ID: TOSHIYE [name] MRN: [id number]; DOB: 1930/08/02  Admit date: 12/11/2018 Date of Consult: 12/15/2018  Primary Care Provider: Helane Rima, DO Primary Cardiologist: Chilton Si, MD   Primary Electrophysiologist:  None     Patient Profile:   [name] [name] is a 85 y.o. female with a hx of rheumatoid arthritis who is on prednisone and other immunosuppressants.  She was admitted with hand cellulitis and abscess and we are who is being seen today for the evaluation of preoperative evaluation prior to hand surgery at the request of  Dr. Caleb Popp.  History of Present Illness:   Ms. [name] [name] is an 85 year old female with a history of hypertension, nonobstructive coronary artery disease, chronic combined systolic and diastolic congestive heart failure ( Aug. 2019) , DVT/PE ( Sept. 2019) on Xarelto, and rheumatoid arthritis on prednisone and other immunosuppressants.  She was admitted with temperature of 102.7 and swelling of her right hand.  There was some concern that she may need hand surgery . She has been seen by Orthopedics and does not need hand surgery but will need cervical spine surgery in the near future .  In August she was admitted with just heart failure and she had markedly depressed left ventricular systolic function with an EF of 25 to 30%.  Over time and with medical therapy, her left ventricular systolic function has improved the echocardiogram from October, 2019 revealed an ejection fraction of 60 to 65%.  She does have grade 1 diastolic dysfunction.  She previously had a history of pulmonary hypertension but her PA pressures had normalized by October, 2019:  She feels well,  No CP , no dyspnea Is very immobilized by her arthritis.   Is not very active     Past Medical History:  Diagnosis Date  . Allergic rhinitis due to pollen 04/27/2007  . Arthritis    "in q joint" (08/07/2013)  . CHF (congestive heart failure) (HCC)   .  Coronary atherosclerosis of native coronary artery   . Cough   . Disorder of bone and cartilage, unspecified   . Edema 05/03/2013  . Exertional shortness of breath    "sometimes" (08/07/2013)  . First degree atrioventricular block   . Gout, unspecified 04/19/2013  . Hyperlipidemia 04/27/2007  . Hypertension   . Midline low back pain without sciatica 06/27/2014  . Muscle spasm   . Muscle weakness (generalized)   . Myocardial infarction (HCC) ~ 1970  . Onychia and paronychia of toe   . Osteoarthrosis, unspecified whether generalized or localized, unspecified site 04/27/2007  . Other fall   . Other malaise and fatigue   . Pneumonia 05/2018  . Prepatellar bursitis   . Pulmonary embolism (HCC) 07/2018  . Rheumatic nodule 06/28/2017  . Seizures (HCC)   . Stroke (HCC) 01/17/2014  . TIA (transient ischemic attack)    "a few one summer" (08/07/2013)  . Type II diabetes mellitus (HCC)    "fasting 90-110s" (08/07/2013)  . Unspecified essential hypertension 08/08/2013  . Unspecified vitamin D deficiency     Past Surgical History:  Procedure Laterality Date  . ABDOMINAL HYSTERECTOMY  04/1980  . APPENDECTOMY  1960  . CARDIAC CATHETERIZATION  2003  . CATARACT EXTRACTION W/ INTRAOCULAR LENS  IMPLANT, BILATERAL Bilateral ~ 2012  . CHOLECYSTECTOMY N/A 06/26/2018   Procedure: LAPAROSCOPIC CHOLECYSTECTOMY POSSIBLE INTRAOPERATIVE CHOLANGIOGRAM;  Surgeon: Abigail Miyamoto, MD;  Location: [facility] OR;  Service: General;  Laterality: N/A;  . JOINT REPLACEMENT    . REPLACEMENT TOTAL  KNEE Right 09/2005  . RIGHT/LEFT HEART CATH AND CORONARY ANGIOGRAPHY N/A 06/01/2018   Procedure: RIGHT/LEFT HEART CATH AND CORONARY ANGIOGRAPHY;  Surgeon: Corky Crafts, MD;  Location: [facility] INVASIVE CV LAB;  Service: Cardiovascular;  Laterality: N/A;  . SHOULDER OPEN ROTATOR CUFF REPAIR Right 07/1999  . TONSILLECTOMY  08/1974  . TOTAL KNEE ARTHROPLASTY Left 08/07/2013   Procedure: TOTAL KNEE ARTHROPLASTY- LEFT;  Surgeon:  Dannielle Huh, MD;  Location: [facility] OR;  Service: Orthopedics;  Laterality: Left;  . TRANSTHORACIC ECHOCARDIOGRAM  2003   EF 55-65%; mild concentric LVH     Home Medications:  Prior to Admission medications   Medication Sig Start Date End Date Taking? Authorizing Provider  albuterol (PROVENTIL HFA;VENTOLIN HFA) 108 (90 Base) MCG/ACT inhaler Inhale 1-2 puffs into the lungs every 6 (six) hours as needed for wheezing or shortness of breath. 12/08/18  Yes Margit Hanks, MD  allopurinol (ZYLOPRIM) 100 MG tablet Take 1 tablet (100 mg total) by mouth at bedtime. 12/08/18  Yes Margit Hanks, MD  carvedilol (COREG) 25 MG tablet Take 1 tablet (25 mg total) by mouth 2 (two) times daily with a meal. 12/08/18  Yes Margit Hanks, MD  D3-1000 25 MCG (1000 UT) capsule Take 1 capsule (1,000 Units total) by mouth daily. 12/08/18  Yes Margit Hanks, MD  diclofenac sodium (VOLTAREN) 1 % GEL Apply 2 g topically daily as needed (pain). 12/08/18  Yes Margit Hanks, MD  ENSURE (ENSURE) Take 237 mLs by mouth every evening.    Yes [provider]  furosemide (LASIX) 20 MG tablet Take 1 tablet (20 mg total) by mouth daily. 12/08/18  Yes Margit Hanks, MD  gabapentin (NEURONTIN) 400 MG capsule Take 1 capsule (400 mg total) by mouth 3 (three) times daily. 12/08/18  Yes Margit Hanks, MD  hydrALAZINE (APRESOLINE) 100 MG tablet Take 1 tablet (100 mg total) by mouth 3 (three) times daily. 12/08/18  Yes Margit Hanks, MD  HYDROcodone-acetaminophen (NORCO/VICODIN) 5-325 MG tablet Take 1 tablet by mouth every 6 (six) hours. 12/08/18  Yes Margit Hanks, MD  hydroxychloroquine (PLAQUENIL) 200 MG tablet Take 1 tablet (200 mg total) by mouth 2 (two) times daily. 12/08/18  Yes Margit Hanks, MD  hydroxypropyl methylcellulose / hypromellose (ISOPTO TEARS / GONIOVISC) 2.5 % ophthalmic solution Place 1 drop into both eyes daily. 12/08/18  Yes Margit Hanks, MD  isosorbide mononitrate (IMDUR) 30 MG 24  hr tablet Take 1 tablet (30 mg total) by mouth at bedtime. 12/08/18  Yes Margit Hanks, MD  leflunomide (ARAVA) 20 MG tablet Take 1 tablet (20 mg total) by mouth daily. 12/08/18  Yes Margit Hanks, MD  Melatonin 3 MG TABS Take 1 tablet (3 mg total) by mouth at bedtime. 12/08/18  Yes Margit Hanks, MD  Olopatadine HCl 0.2 % SOLN Place 1 drop into both eyes daily. 12/08/18  Yes Margit Hanks, MD  predniSONE (DELTASONE) 10 MG tablet Take 10 mg by mouth daily with breakfast.   Yes [provider]  rivaroxaban (XARELTO) 20 MG TABS tablet Take 1 tablet (20 mg total) by mouth daily with supper. 12/08/18  Yes Margit Hanks, MD  sacubitril-valsartan (ENTRESTO) 49-51 MG Take 1 tablet by mouth daily at 12 noon. 12/08/18  Yes Margit Hanks, MD  triamcinolone cream (KENALOG) 0.1 % Apply 1 application topically 2 (two) times daily as needed (for itching- to affected sites). 12/08/18  Yes Margit Hanks, MD  vitamin  C (ASCORBIC ACID) 500 MG tablet Take 1 tablet (500 mg total) by mouth daily. 12/08/18  Yes Margit HanksAlexander, Anne D, MD    Inpatient Medications: Scheduled Meds: . allopurinol  100 mg Oral Daily  . carvedilol  25 mg Oral BID WC  . cholecalciferol  1,000 Units Oral Daily  . docusate sodium  100 mg Oral BID  . feeding supplement (ENSURE ENLIVE)  237 mL Oral QPM  . furosemide  20 mg Oral Daily  . gabapentin  400 mg Oral TID  . hydrALAZINE  100 mg Oral TID  . HYDROcodone-acetaminophen  1 tablet Oral Q6H  . hydroxychloroquine  200 mg Oral BID  . isosorbide mononitrate  30 mg Oral QHS  . Melatonin  1 tablet Oral QHS  . olopatadine  1 drop Both Eyes BID  . polyvinyl alcohol  1 drop Both Eyes Daily  . predniSONE  20 mg Oral Q breakfast  . rivaroxaban  20 mg Oral Q supper  . sacubitril-valsartan  1 tablet Oral Q1200  . senna  1 tablet Oral BID  . vitamin C  500 mg Oral Daily   Continuous Infusions: . ceFEPime (MAXIPIME) IV 1 g (12/15/18 0929)   PRN Meds: acetaminophen  **OR** acetaminophen, albuterol, diclofenac sodium, ondansetron **OR** ondansetron (ZOFRAN) IV, triamcinolone cream  Allergies:    Allergies  Allergen Reactions  . Clonidine Derivatives Swelling    [name]  . Fish Allergy Diarrhea, Swelling and Other (See Comments)    Turns skin "black," but can tolerate white fish Salmon- Diarrhea  . Shellfish Allergy Hives  . Doxycycline Rash  . Indomethacin Other (See Comments)    Reaction not recalled by the patient  . Lyrica [Pregabalin] Other (See Comments)    Hallucinations   . Methyldopa Other (See Comments)    Aldomet (for hypertension): Reaction not recalled by the patient  . Morphine And Related Other (See Comments)    Family reports it drops her bp that she needs iv fluids  . Orange Fruit [Citrus] Other (See Comments)    Indigestion/heartburn  . Strawberry (Diagnostic) Itching  . Cetirizine Hcl Itching and Rash  . Codeine Itching  . Levaquin [Levofloxacin In D5w] Rash  . Tomato Rash    Social History:   Social History   Socioeconomic History  . Marital status: Widowed    Spouse name: Not on file  . Number of children: 3  . Years of education: Not on file  . Highest education level: Not on file  Occupational History  . Not on file  Social Needs  . Financial resource strain: Not on file  . Food insecurity:    Worry: Not on file    Inability: Not on file  . Transportation needs:    Medical: Not on file    Non-medical: Not on file  Tobacco Use  . Smoking status: Never Smoker  . Smokeless tobacco: Never Used  Substance and Sexual Activity  . Alcohol use: No    Alcohol/week: 0.0 standard drinks  . Drug use: No  . Sexual activity: Never  Lifestyle  . Physical activity:    Days per week: Not on file    Minutes per session: Not on file  . Stress: Not on file  Relationships  . Social connections:    Talks on phone: Not on file    Gets together:  Not on file    Attends religious service: Not on file    Active member  of club or organization: Not on file    Attends meetings of clubs or organizations: Not on file    Relationship status: Not on file  . Intimate partner violence:    Fear of current or ex partner: Not on file    Emotionally abused: Not on file    Physically abused: Not on file    Forced sexual activity: Not on file  Other Topics Concern  . Not on file  Social History Narrative   Widowed   Walks with cane    Family History:    Family History  Problem Relation Age of Onset  . Heart attack Father      ROS:  Please see the history of present illness.   All other ROS reviewed and negative.     Physical Exam/Data:   Vitals:   12/14/18 0449 12/14/18 1352 12/14/18 2007 12/15/18 0509  BP: 129/65 139/64 (!) 144/69 (!) 144/75  Pulse: 66 72 74 72  Resp: 12   16  Temp: (!) 97.3 F (36.3 C) 98 F (36.7 C) 98.7 F (37.1 C) 97.8 F (36.6 C)  TempSrc: Oral Oral Oral Oral  SpO2: 99% 98% 98% 100%  Weight:      Height:        Intake/Output Summary (Last 24 hours) at 12/15/2018 1703 Last data filed at 12/15/2018 1038 Gross per 24 hour  Intake 811.5 ml  Output 600 ml  Net 211.5 ml   Last 3 Weights 12/13/2018 12/11/2018 12/08/2018  Weight (lbs) 172 lb 172 lb 185 lb  Weight (kg) 78.019 kg 78.019 kg 83.915 kg     Body mass index is 29.52 kg/m.  General:  Well nourished, well developed, in no acute distress HEENT: normal Lymph: no adenopathy Neck: no JVD Endocrine:  No thryomegaly Vascular: No carotid bruits; FA pulses 2+ bilaterally without bruits  Cardiac:  normal S1, S2; RRR;  Soft systolic murmur Lungs:  clear to auscultation bilaterally, no wheezing, rhonchi or rales  Abd: soft, nontender, no hepatomegaly  Ext: no edema Musculoskeletal:   + for RA changes.  Neuro:  CNs 2-12 intact, no focal abnormalities noted Psych:  Normal affect   EKG:  The EKG was personally reviewed and demonstrates:  S. Tach with no  ST or T wave changes.  Telemetry:      Relevant CV Studies:   Laboratory Data:  Chemistry Recent Labs  Lab 12/11/18 1103 12/13/18 0845 12/14/18 0613  NA 136 134* 134*  K 3.7 3.5 3.7  CL 103 102 104  CO2 25 24 22   GLUCOSE 99 109* 178*  BUN 12 13 17   CREATININE 0.98 1.15* 1.00  CALCIUM 8.4* 8.1* 7.9*  GFRNONAA 52* 43* 51*  GFRAA >60 50* 59*  ANIONGAP 8 8 8     Recent Labs  Lab 12/11/18 1103 12/13/18 0845 12/14/18 0613  PROT 6.1* 5.5* 5.5*  ALBUMIN 3.1* 2.6* 2.4*  AST 24 16 19   ALT 7 9 11   ALKPHOS 42 36* 39  BILITOT 0.9 0.5 0.3   Hematology Recent Labs  Lab 12/13/18 0845 12/14/18 0613 12/15/18 0203  WBC 5.3 4.9 6.7  RBC 2.82* 2.70* 2.64*  HGB 8.5* 8.3* 7.9*  HCT 27.5* 26.0* 24.2*  MCV 97.5 96.3 91.7  MCH 30.1 30.7 29.9  MCHC 30.9 31.9 32.6  RDW 16.8* 16.4* 16.1*  PLT 239 229 249   Cardiac EnzymesNo results for input(s): TROPONINI in the last 168 hours. No results for input(s): TROPIPOC in the last 168 hours.  BNPNo results  for input(s): BNP, PROBNP in the last 168 hours.  DDimer No results for input(s): DDIMER in the last 168 hours.  Radiology/Studies:  Dg Chest 2 View  Result Date: 12/13/2018 CLINICAL DATA:  Fever, hypoxia. EXAM: CHEST - 2 VIEW COMPARISON:  Radiograph of November 15, 2018 FINDINGS: Stable cardiomegaly. No pneumothorax or pleural effusion is noted. Both lungs are clear. The visualized skeletal structures are unremarkable. IMPRESSION: No active cardiopulmonary disease. Electronically Signed   By: Lupita Raider, [name]   On: 12/13/2018 09:51   Mr Hand Right W Wo Contrast  Result Date: 12/13/2018 CLINICAL DATA:  Hand swelling, cellulitis EXAM: MRI OF THE RIGHT HAND WITHOUT AND WITH CONTRAST TECHNIQUE: Multiplanar, multisequence MR imaging of the right hand was performed before and after the administration of intravenous contrast. CONTRAST:  7.5 mL Gadavist COMPARISON:  None. FINDINGS: Bones/Joint/Cartilage No acute fracture or dislocation.  Scapholunate widening consistent with a scapholunate ligament tear with proximal migration of the capitate consistent with a SLAC wrist. Severe carpal synovitis. Multiple erosions involving the carpal bones. Carpal crowding. Erosion of the distal radius. Erosion of the first metacarpal head. Small joint effusion and synovitis of the second, third, fourth and fifth MCP joints. Mild osteoarthritis of the first CMC joint. Ligaments Collateral ligaments are intact. Muscles and Tendons Muscles are normal. No muscle atrophy. Flexor and extensor compartment tendons are intact. Soft tissue Soft tissue edema with enhancement along the radial aspect of the distal forearm and wrist. Complex peripherally enhancing fluid collection measuring approximately 1.8 x 4.2 x 6.9 cm along the dorsal radial aspect of the forearm and wrist most concerning for an abscess. No soft tissue mass. IMPRESSION: 1. Cellulitis of the distal radial aspect of forearm and wrist. 1.8 x 4.2 x 6.9 cm peripherally enhancing fluid collection along the dorsal radial aspect of the distal forearm and wrist most concerning for an abscess. The fluid collection extends into the distal forearm which is outside of the field of view limiting full characterization of the extent of fluid collection. 2. Erosive changes of the carpus with carpal crowding and diffuse carpal synovitis as can be seen with an inflammatory arthropathy such as rheumatoid arthritis. 3. Small joint effusion and synovitis of the second, third, fourth and fifth MCP joints. 4. SLAC wrist. Electronically Signed   By: Elige Ko   On: 12/13/2018 20:48    Assessment and Plan:   1. Pre op visit for cervical spine surgery  Pt has a hx of CHF but her LV systolic function has normalized.  shes not having any signs or symptoms of CHF and denies any cp  She was transferred here originally with the idea that she would need hand surgery but ortho has said that she does not need hand surgery at this  time  She is at low - moderate risk for her cervical surgery ( due to age and other medical issues ).  She does not need any further cardiac work up   She is on Xarelto for her hx of DVT and PE and this will need to be held.  I suspect she could safely hold Xarelto for 3 days prior to surgery with heparin bridging .   Will let IM weigh in on this .     She will likely get this surgery at a later time - possibly at a separate admission .  Dr. Caleb Popp will be consulting with ortho to decide on the best timing of this surgery    [facility] will  sign off.   Medication Recommendations:  Continue same cardiac meds.  Other recommendations (labs, testing, etc):   Follow up as an outpatient:  With Dr. Duke Salvia   For questions or updates, please contact [facility] Please consult [email or web address] for contact info under     Signed, Kristeen Miss, MD  12/15/2018 5:03 PM

## 2018-12-15 NOTE — Telephone Encounter (Signed)
   Primary Cardiologist:Tiffany Duke Salvia, MD  Chart reviewed as part of pre-operative protocol coverage. Because of Celia C Heidinger's past medical history and time since last visit, he/she will require a follow-up visit in order to better assess preoperative cardiovascular risk.  Pt currently hospitalized - would need office visit to clear.  Please arrange in 2-3 weeks.  Pre-op covering staff: - Please schedule appointment and call patient to inform them. - Please contact requesting surgeon's office via preferred method (i.e, phone, fax) to inform them of need for appointment prior to surgery.  If applicable, this message will also be routed to pharmacy pool and/or primary cardiologist for input on holding anticoagulant/antiplatelet agent as requested below so that this information is available at time of patient's appointment.   Nada Boozer, NP  12/15/2018, 8:36 AM

## 2018-12-16 ENCOUNTER — Inpatient Hospital Stay (HOSPITAL_COMMUNITY): Payer: Medicare Other

## 2018-12-16 DIAGNOSIS — N183 Chronic kidney disease, stage 3 (moderate): Secondary | ICD-10-CM

## 2018-12-16 DIAGNOSIS — E1122 Type 2 diabetes mellitus with diabetic chronic kidney disease: Secondary | ICD-10-CM

## 2018-12-16 DIAGNOSIS — M069 Rheumatoid arthritis, unspecified: Secondary | ICD-10-CM

## 2018-12-16 DIAGNOSIS — E1121 Type 2 diabetes mellitus with diabetic nephropathy: Secondary | ICD-10-CM

## 2018-12-16 DIAGNOSIS — M549 Dorsalgia, unspecified: Secondary | ICD-10-CM

## 2018-12-16 DIAGNOSIS — I130 Hypertensive heart and chronic kidney disease with heart failure and stage 1 through stage 4 chronic kidney disease, or unspecified chronic kidney disease: Secondary | ICD-10-CM

## 2018-12-16 LAB — BASIC METABOLIC PANEL
Anion gap: 11 (ref 5–15)
BUN: 22 mg/dL (ref 8–23)
CO2: 24 mmol/L (ref 22–32)
Calcium: 8.2 mg/dL — ABNORMAL LOW (ref 8.9–10.3)
Chloride: 104 mmol/L (ref 98–111)
Creatinine, Ser: 1.22 mg/dL — ABNORMAL HIGH (ref 0.44–1.00)
GFR calc Af Amer: 46 mL/min — ABNORMAL LOW (ref >60)
GFR calc non Af Amer: 40 mL/min — ABNORMAL LOW (ref >60)
Glucose, Bld: 142 mg/dL — ABNORMAL HIGH (ref 70–99)
Potassium: 3.9 mmol/L (ref 3.5–5.1)
Sodium: 139 mmol/L (ref 135–145)

## 2018-12-16 LAB — CBC
HCT: 24 % — ABNORMAL LOW (ref 36.0–46.0)
Hemoglobin: 7.8 g/dL — ABNORMAL LOW (ref 12.0–15.0)
MCH: 30.2 pg (ref 26.0–34.0)
MCHC: 32.5 g/dL (ref 30.0–36.0)
MCV: 93 fL (ref 80.0–100.0)
Platelets: 268 10*3/uL (ref 150–400)
RBC: 2.58 MIL/uL — ABNORMAL LOW (ref 3.87–5.11)
RDW: 16.3 % — ABNORMAL HIGH (ref 11.5–15.5)
WBC: 6.5 10*3/uL (ref 4.0–10.5)
nRBC: 0 % (ref 0.0–0.2)

## 2018-12-16 LAB — PROCALCITONIN: Procalcitonin: 0.17 ng/mL

## 2018-12-16 MED ORDER — PREDNISONE 20 MG PO TABS
40.0000 mg | ORAL_TABLET | Freq: Every day | ORAL | Status: DC
  Administered 2018-12-17 – 2018-12-20 (×4): 40 mg via ORAL
  Filled 2018-12-16 (×4): qty 2

## 2018-12-16 MED ORDER — LORAZEPAM 1 MG PO TABS
1.0000 mg | ORAL_TABLET | Freq: Once | ORAL | Status: AC | PRN
  Administered 2018-12-16: 1 mg via ORAL
  Filled 2018-12-16: qty 1

## 2018-12-16 MED ORDER — PREDNISONE 20 MG PO TABS
20.0000 mg | ORAL_TABLET | Freq: Every day | ORAL | Status: AC
  Administered 2018-12-16: 20 mg via ORAL
  Filled 2018-12-16: qty 1

## 2018-12-16 NOTE — Progress Notes (Signed)
PT Cancellation Note  Patient Details Name: FOY GALEY MRN: [id number] DOB: 13-Aug-1930   Cancelled Treatment:    Reason Eval/Treat Not Completed: (P) Patient at procedure or test/unavailable Pt is off floor for MRI. PT and staff are able as pt requires two person assist for mobility.  Season Astacio B. Beverely Risen PT, DPT Acute Rehabilitation Services Pager [phone] Office [phone]    Elon Alas Fleet 12/16/2018, 9:55 AM

## 2018-12-16 NOTE — Progress Notes (Signed)
Physical Therapy Treatment Patient Details Name: [name] [name] MRN: [id number] DOB: 19-Mar-1930 Today's Date: 12/16/2018    History of Present Illness [name] [name] is a 85 y.o. female with history of rheumatoid arthritis, gout, diabetes mellitus type 2, diastolic CHF, PE on Xarelto, history of CVA, iron deficiency, hypertension, nonobstructive CAD was recently admitted last month for low back pain at the time MRI of the lumbar spine showed degenerative disc disease and patient was eventually discharged to rehab , DC from [facility] 12/10/18. Per family, patient unable to ambulate after DC.     PT Comments    Pt very eager to get up with therapy today. Pt making progress towards her goals, however continues to be limited in safe mobility by decreased strength and endurance. Pt requires modAx1 for bed mobility and modAx2 for sit>stand transfers with [facility]. Pt with increased ability to stand with being able to pull up on the Stedy instead of pushing up from the bed to the RW. D/c plans remain appropriate as pt is motivated and reports when she was feeling well at SNF she was able to ambulate with RW. PT will continue to follow acutely.    Follow Up Recommendations  SNF     Equipment Recommendations  None recommended by PT    Recommendations for Other Services       Precautions / Restrictions Precautions Precautions: Fall Precaution Comments: generalized pain in joints Restrictions Weight Bearing Restrictions: No Other Position/Activity Restrictions: Pt with R hand swelling and mild pain, no WB status documented in chart but pt with very light use of RUE during PT as a conservative measure     Mobility  Bed Mobility Overal bed mobility: Needs Assistance Bed Mobility: Supine to Sit;Sit to Supine;Rolling Rolling: Min assist   Supine to sit: HOB elevated;Mod assist     General bed mobility comments: modA for management of LE across bed and to floor, pt used R UE to reach across body to  therapist and pull to upright while PT provided pad scoot to EoB  Transfers Overall transfer level: Needs assistance   Transfers: Sit to/from Stand Sit to Stand: Mod assist;From elevated surface;+2 safety/equipment         General transfer comment: modA for power up to [facility], vc for hand placement for powerup and for posterior pelvic tilt to come all the way to upright, both from bed surface and from Stedy pads  Ambulation/Gait Ambulation/Gait assistance: (unable to attempt)                     Balance Overall balance assessment: Needs assistance Sitting-balance support: No upper extremity supported;Feet supported Sitting balance-Leahy Scale: Fair     Standing balance support: Bilateral upper extremity supported Standing balance-Leahy Scale: Poor                              Cognition Arousal/Alertness: Awake/alert Behavior During Therapy: WFL for tasks assessed/performed Overall Cognitive Status: Within Functional Limits for tasks assessed                                        Exercises Total Joint Exercises Ankle Circles/Pumps: AROM;Both;20 reps;Seated    General Comments General comments (skin integrity, edema, etc.): Pt with slight dizziness with inital sitting and initial standing but quickly dissipated      Pertinent Vitals/Pain  Pain Assessment: Faces Faces Pain Scale: Hurts a little bit Pain Location: R wrist and hand  Pain Descriptors / Indicators: Discomfort Pain Intervention(s): Limited activity within patient's tolerance;Monitored during session;Repositioned           PT Goals (current goals can now be found in the care plan section) Acute Rehab PT Goals Patient Stated Goal: none stated  PT Goal Formulation: With family Time For Goal Achievement: 12/26/18 Potential to Achieve Goals: Fair Progress towards PT goals: Progressing toward goals    Frequency    Min 2X/week      PT Plan Current plan remains  appropriate       AM-PAC PT "6 Clicks" Mobility   Outcome Measure  Help needed turning from your back to your side while in a flat bed without using bedrails?: A Little Help needed moving from lying on your back to sitting on the side of a flat bed without using bedrails?: A Lot Help needed moving to and from a bed to a chair (including a wheelchair)?: A Lot Help needed standing up from a chair using your arms (e.g., wheelchair or bedside chair)?: A Lot Help needed to walk in hospital room?: Total Help needed climbing 3-5 steps with a railing? : Total 6 Click Score: 11    End of Session Equipment Utilized During Treatment: Gait belt Activity Tolerance: Patient tolerated treatment well;Patient limited by fatigue Patient left: in bed;with call bell/phone within reach;with family/visitor present;with bed alarm set Nurse Communication: Mobility status PT Visit Diagnosis: Other abnormalities of gait and mobility (R26.89);Muscle weakness (generalized) (M62.81)     Time: 4847-2072 PT Time Calculation (min) (ACUTE ONLY): 22 min  Charges:  $Therapeutic Activity: 8-22 mins                     [place] Flett B. Beverely Risen PT, DPT Acute Rehabilitation Services Pager [phone] Office [phone]    Elon Alas Fleet 12/16/2018, 12:13 PM

## 2018-12-16 NOTE — Progress Notes (Signed)
[facility] PROGRESS NOTE  [name] [name] HUT:[id number] DOB: 1930/06/09 DOA: 12/11/2018 PCP: Briscoe Deutscher, DO  Assessment/Plan: 1. Persistent fever, resolved. Afebrile > 48 hours. Procalcitonin not suggestive of infection. Blood cultures negative. Wrist nodule more consistent with RA not abscess after hand surgery evaluation. Will dc cefepime and monitor off antibiotics. Suspect this was related to RA flare so will increase prednisone from 20 to 40. Encouraged by no localizing signs or symptoms of infection, no white count, unremarkable CXR, wnl ua, negative blood cultures to date.  2. Enhancing fluid collection of right wrist.  Presumed to be abscess based off MR imaging.  Dr. Caralyn Guile with surgery evaluated at bedside with serosanguinous fluid on aspiration and crystal-like material consistent with inflammatory arhtopathy.  Patient reports this nodule has been present for over a year related to her known RA disease. Clinically this does not seem to be an abscess it is not tender, there is no erythema and noted aspiration of fluid findings.   3. Right extremity weakness, subacute, improving. Has been ongoing since January per documentation and patient. Improved some in past 24 hours. Repeat MRI imaging shows stable lumbar stenosis. Will treat as possible link to RA flare as mentioned above. Noted improvement with therapy at rehab facility.PT to evaluate  4. Chronic combined systolic/diastolic heart failure.  Euvolemic on exam.  TTE, 08/2018 with EF 60-65% (improved from 07/2018 with EF of 35-40%), grade 1 diastolic dysfunction.  Continue home Lasix 20 mg daily, Entresto, Coreg Daily weights, strict I's and O's. Cardiology gave clearance for any spine surgery  5. Rheumatoid arthritis, suspect acute flare given persistent fevers and aches, ESR elevated ( not specific).  Continue home Plaquenil.  Increase prednisone from 83m to 481m Hold leflunomide while making sure no fever while off  antibiotics.  6. Hypertension, stable.  Continue home hydralazine, Coreg  7. History of right lower lobe PE and Right DVT (07/2018).  Normal respiratory exam, no SOB. Continue home Xarelto, initial plan to continue for at least 6 months before considering discontinuation (CTA on 07/2018 showed small  PE in right lower lobe, repeat CTA on 08/2018 showed resolution)  8. Gout, stable.  Continue home allopurinol  9. Lumbar Spinal stenosis of L4-L5, L5-S1. No claudication present. No bowel/urinary incontinence.  Diagnosed on 10/2018 with no signs of cord compression and repeat MRI from 11/2018 showed no changes.  Dr. GiGladstone Lighterlanning for decompressive laminectomy as outpatient, but she will need to be off xarelto for at least a week per Dr. GiGladstone Lighterrior to any surgery. Repeat MRI spine here shows stable stenosis   10. T2DM, not on insulin.  A1c 5.9% 06/2018 CBG monitoring, SSI, diabetic diet  11. CKD, stage III.  GFR range 43-51.  Creatinine stable at baseline 0.9-1.1.  Avoid nephrotoxins, monitor BMP.  12. Anemia of chronic disease. Likely related to RA (iron panel from 10/2018 shows low iron, normal ferritin, hgb stable, daily CBC  13. CAD. MI in 1970a. Nonobstructive on cath in 2019.No chest pain. Continue imdur and carvedilol  14. Pain/psych. Continue home gabapentin  Code Status: FULL Family Communication: will call daughter jeExcell Seltzert [phone] Plan: monitor for fever off antibiotics, increase prednisone, PT eval   Consultants:  Hand surgery(dr. OrCaralyn Guile cardiology  Procedures:  Bedside aspiration of right wrist nodule- consistent with crystal like material, sero sanguinous fluid  Antibiotics:  IV vancomycin, 2/4-2/5  IV cefepime 2/4-2/7  HPI/Subjective:  HaTRACEY HERMANCEs a 89.o. year old female with medical history significant  for combined systolic/diastolic CHF, CAD, PE on xarelto, diabetes,  RA on leflunomide, Plaquenil, and prednisone, prior  stroke, HTN, gout, severe lumbar spinal stenosis who presented on 12/11/2018 with reports of weakness to right leg and difficulty ambulating/transferring per family after recent discharge from skilled nursing facility and was found to have decreased ability to bear weight on right leg and reported fever. She was discharged from her SNF on Saturday and reports difficulty transferring for several weeks due to decreased ability to bear weight on right leg ( in SNF she was transferred to wheelchair for ambulation). Denies any urine/bowel incontinence. She does not recall any fevers. Denies any localizing symptoms other than mild cough. Reports adherence to all her medications, states her last weight was in the 170s prior to discharge from the SNF. No pain in her right wrist Transferred from [facility] to [facility] for surgical debridement of presumed right wrist abscess as potential etiology for recurrent fevers. Evaluated at bedside on 12/14/18 with aspration of serosanguineous fluid with no concern for actual infection/abscess. Found to have a chronic longstanding nodule related to her RA  Notes improvement in right leg pain and weakness. No fevers or chills. No cough or CP  Objective: Vitals:   12/16/18 0515 12/16/18 1146  BP: (!) 145/78 (!) 151/69  Pulse: 77 76  Resp: 18 16  Temp: 98.2 F (36.8 C) 98.1 F (36.7 C)  SpO2: 97% 100%    Intake/Output Summary (Last 24 hours) at 12/16/2018 1606 Last data filed at 12/16/2018 0900 Gross per 24 hour  Intake 480 ml  Output 800 ml  Net -320 ml   Filed Weights   12/11/18 0952 12/13/18 1525  Weight: 78 kg 78 kg    Exam:   General:  Lying in bed in no distress  Cardiovascular: RRR, scant edema in bilateral lower extremities ( non-pitting),  Respiratory: normal respiratory effort on room air, no crackles or wheezing  Abdomen: soft, obese abdomen  Musculoskeletal: limited ROM of right leg  Skin right wrist with palpable nodule, nontender with no surrounding  erythema or fluctuance  Neurologic decreased strength in right leg but able to lift against resistance, sensation intact  Data Reviewed: Basic Metabolic Panel: Recent Labs  Lab 12/11/18 1103 12/13/18 0845 12/14/18 0613 12/16/18 0159  NA 136 134* 134* 139  K 3.7 3.5 3.7 3.9  CL 103 102 104 104  CO2 25 24 22 24   GLUCOSE 99 109* 178* 142*  BUN 12 13 17 22   CREATININE 0.98 1.15* 1.00 1.22*  CALCIUM 8.4* 8.1* 7.9* 8.2*   Liver Function Tests: Recent Labs  Lab 12/11/18 1103 12/13/18 0845 12/14/18 0613  AST 24 16 19   ALT 7 9 11   ALKPHOS 42 36* 39  BILITOT 0.9 0.5 0.3  PROT 6.1* 5.5* 5.5*  ALBUMIN 3.1* 2.6* 2.4*   No results for input(s): LIPASE, AMYLASE in the last 168 hours. No results for input(s): AMMONIA in the last 168 hours. CBC: Recent Labs  Lab 12/11/18 1103 12/13/18 0845 12/14/18 7353 12/15/18 0203 12/16/18 0159  WBC 6.1 5.3 4.9 6.7 6.5  NEUTROABS 3.6  --   --   --   --   HGB 9.2* 8.5* 8.3* 7.9* 7.8*  HCT 29.0* 27.5* 26.0* 24.2* 24.0*  MCV 98.6 97.5 96.3 91.7 93.0  PLT 298 239 229 249 268   Cardiac Enzymes: No results for input(s): CKTOTAL, CKMB, CKMBINDEX, TROPONINI in the last 168 hours. BNP (last 3 results) Recent Labs    05/28/18 1614 06/22/18 2153  07/23/18 2100  BNP 2,077.6* 1,146.7* 138.3*    ProBNP (last 3 results) No results for input(s): PROBNP in the last 8760 hours.  CBG: Recent Labs  Lab 12/11/18 0956 12/14/18 1714  GLUCAP 89 149*    Recent Results (from the past 240 hour(s))  Blood culture (routine x 2)     Status: None (Preliminary result)   Collection Time: 12/13/18  8:43 AM  Result Value Ref Range Status   Specimen Description   Final    BLOOD RIGHT ARM Performed at [facility] [address], [place], [place] [address]    Special Requests   Final    BOTTLES DRAWN AEROBIC AND ANAEROBIC Blood Culture adequate volume Performed at [facility] [address], [place], [place]  [address]    Culture   Final    NO GROWTH 3 DAYS Performed at [facility], [place] [address] [place], [place] [address]    Report Status PENDING  Incomplete  Blood culture (routine x 2)     Status: None (Preliminary result)   Collection Time: 12/13/18  8:45 AM  Result Value Ref Range Status   Specimen Description   Final    BLOOD LEFT HAND Performed at [place] [address], [place], [place] [address]    Special Requests   Final    BOTTLES DRAWN AEROBIC AND ANAEROBIC BCAV Performed at [facility], [place] [address] [place], [place] [address]    Culture   Final    NO GROWTH 3 DAYS Performed at [facility], [place] [address] [place], [place] [address]    Report Status PENDING  Incomplete  Urine culture     Status: None   Collection Time: 12/13/18 11:11 AM  Result Value Ref Range Status   Specimen Description   Final    URINE, RANDOM Performed at [facility] [address], [place], [place] [address]    Special Requests   Final    NONE Performed at [facility], [place] [address] [place], [place] [address]    Culture   Final    NO GROWTH Performed at [facility], [place] [address] [place], [place] [address]    Report Status 12/14/2018 FINAL  Final  Surgical pcr screen     Status: None   Collection Time: 12/14/18  1:40 PM  Result Value Ref Range Status   MRSA, PCR NEGATIVE NEGATIVE Final   Staphylococcus aureus NEGATIVE NEGATIVE Final    Comment: (NOTE) The Xpert SA Assay (FDA approved for NASAL specimens in patients 23 years of age and older), is one component of a comprehensive surveillance program. It is not intended to diagnose infection nor to guide or monitor treatment. Performed at [facility], [place] [address] [place], [place] [address]      Studies: Mr Lumbar Spine Wo Contrast  Result Date: 12/16/2018 CLINICAL DATA:  Back pain.  Inability to bear  weight on legs. EXAM: MRI LUMBAR SPINE WITHOUT CONTRAST TECHNIQUE: Multiplanar, multisequence MR imaging of the lumbar spine was performed. No intravenous contrast was administered. COMPARISON:  Two recent examinations: MRI 11/15/2018 and 10/21/2018 FINDINGS: Segmentation: The same numbering system is use as the prior MRIs with partial lumbarization of S1 and a near full disc space at S1-2. Alignment: Stable advanced degenerative lumbar spondylosis with multilevel disc disease and facet disease. Degenerative anterolisthesis of L4 and L5 is stable along with mild degenerative retrolisthesis  of L2. Vertebrae: Stable compression deformity of L1 with L1 hemangioma. No acute lumbar spine fracture. No worrisome bone lesions. Conus medullaris and cauda equina: Conus extends to the L1-2 level. Conus and cauda equina appear normal. Paraspinal and other soft tissues: No significant findings. Stable right renal cyst. Disc levels: L1-2: Mild facet disease but no disc protrusions, spinal or foraminal stenosis. No change since recent MRIs. L2-3: Advanced degenerate disc disease and moderate facet disease. There is a bulging uncovered disc and osteophytic ridging with mild flattening of the ventral thecal sac and mild bilateral lateral recess encroachment. There is also mild foraminal encroachment, left greater than right. This appears stable. L3-4: Stable bulging degenerated annulus, osteophytic ridging, short pedicles and facet disease contributing to moderate spinal and bilateral lateral recess stenosis. No significant foraminal stenosis. L4-5: Degenerative anterolisthesis of L4 with a bulging uncovered disc. This in combination with short pedicles and severe facet disease contributes to moderately severe spinal and bilateral lateral recess stenosis and moderate bilateral foraminal stenosis. These findings are stable. L5-S1: Degenerative anterolisthesis of L5 with a bulging uncovered disc. There is also severe facet disease and  short pedicles contributing to severe spinal and bilateral lateral recess stenosis and moderate bilateral foraminal stenosis. These findings appear stable. S1-S2: Facet disease but no disc protrusions or foraminal stenosis. IMPRESSION: 1. Stable advanced degenerative lumbar spondylosis with multilevel disc disease and facet disease. 2. Stable compression deformity of L1.  No acute bony abnormalities. 3. Stable multifactorial moderate spinal and bilateral lateral recess stenosis at L3-4. 4. Stable multifactorial moderately severe spinal and bilateral lateral recess stenosis at L4-5. There is also moderate bilateral foraminal stenosis. 5. Stable multifactorial severe spinal and bilateral lateral recess stenosis at L5-S1 and moderate bilateral foraminal stenosis. Electronically Signed   By: Marijo Sanes [name]   On: 12/16/2018 11:01    Scheduled Meds: . allopurinol  100 mg Oral Daily  . carvedilol  25 mg Oral BID WC  . cholecalciferol  1,000 Units Oral Daily  . docusate sodium  100 mg Oral BID  . feeding supplement (ENSURE ENLIVE)  237 mL Oral QPM  . furosemide  20 mg Oral Daily  . gabapentin  400 mg Oral TID  . hydrALAZINE  100 mg Oral TID  . HYDROcodone-acetaminophen  1 tablet Oral Q6H  . hydroxychloroquine  200 mg Oral BID  . isosorbide mononitrate  30 mg Oral QHS  . Melatonin  1 tablet Oral QHS  . olopatadine  1 drop Both Eyes BID  . polyvinyl alcohol  1 drop Both Eyes Daily  . predniSONE  20 mg Oral Q breakfast  . [START ON 12/17/2018] predniSONE  40 mg Oral Q breakfast  . rivaroxaban  20 mg Oral Q supper  . sacubitril-valsartan  1 tablet Oral Q1200  . senna  1 tablet Oral BID  . vitamin C  500 mg Oral Daily   Continuous Infusions:   Active Problems:   Type 2 diabetes mellitus with hyperlipidemia ([place]), patient declines statin   Hypertension associated with diabetes ([place])   CAD (coronary artery disease)   Rheumatoid arthritis involving multiple sites ([place])   CKD stage 3 due to type 2  diabetes mellitus ([place])   DM (diabetes mellitus), type 2 with renal complications ([facility])   Chronic combined systolic and diastolic CHF (congestive heart failure) ([facility])   Benign hypertensive heart and kidney disease with CHF and stage 3 chronic kidney disease ([place])   Deep vein thrombosis (DVT) of distal vein of right lower extremity (  [place])   Pulmonary embolism ([place]), 07/2018, on Xarelto with goal to DC after 6 months if more active   Refusal of statin medication by patient   Rheumatoid arthritis involving both knees ([place])   Spinal stenosis, lumbar   Fever   Abscess of right upper extremity      Desiree Hane  [facility]

## 2018-12-16 NOTE — Progress Notes (Signed)
Pt IV occluded will not flush, Cefepime paused awaiting IV team

## 2018-12-16 NOTE — Progress Notes (Signed)
Pharmacy Antibiotic Note  MERCEDESE ACOB is a 85 y.o. female admitted on 12/11/2018 with suspected hand infection.  Pharmacy has been consulted for vancomycin and cefepime dosing.  On day #4 of abx for hand infection. MRI showed abscess / cellulitis. Ortho consulted for possible I&D but decided not to. MD may also be suspecting autoimmune flareup. May be able to monitor off abx soon. Afebrile, WBC wnl. PCT negative  Plan: Continue cefepime 1g IV Q12h Monitor clinical picture, renal function, vanc levels as needed F/U C&S, abx deescalation / LOT  If highly suspicious for acute autoimmune flareup consider monitoring off all abx soon  Height: 5\' 4"  (162.6 cm) Weight: 172 lb (78 kg) IBW/kg (Calculated) : 54.7  Temp (24hrs), Avg:98.1 F (36.7 C), Min:97.8 F (36.6 C), Max:98.3 F (36.8 C)  Recent Labs  Lab 12/11/18 1103 12/13/18 0845 12/14/18 0613 12/15/18 0203 12/16/18 0159  WBC 6.1 5.3 4.9 6.7 6.5  CREATININE 0.98 1.15* 1.00  --  1.22*    Estimated Creatinine Clearance: 33.4 mL/min (A) (by C-G formula based on SCr of 1.22 mg/dL (H)).    Allergies  Allergen Reactions  . Clonidine Derivatives Swelling    [name]  . Fish Allergy Diarrhea, Swelling and Other (See Comments)    Turns skin "black," but can tolerate white fish Salmon- Diarrhea  . Shellfish Allergy Hives  . Doxycycline Rash  . Indomethacin Other (See Comments)    Reaction not recalled by the patient  . Lyrica [Pregabalin] Other (See Comments)    Hallucinations   . Methyldopa Other (See Comments)    Aldomet (for hypertension): Reaction not recalled by the patient  . Morphine And Related Other (See Comments)    Family reports it drops her bp that she needs iv fluids  . Orange Fruit [Citrus] Other (See Comments)    Indigestion/heartburn  . Strawberry (Diagnostic) Itching  . Cetirizine Hcl Itching and Rash  . Codeine Itching  . Levaquin  [Levofloxacin In D5w] Rash  . Tomato Rash    Thank you for allowing pharmacy to be a part of this patient's care.  Enzo Bi, PharmD, BCPS, [facility] Clinical Pharmacist Phone number [phone] 12/16/2018 9:09 AM

## 2018-12-16 NOTE — Care Management Important Message (Signed)
Important Message  Patient Details  Name: [name] [name] MRN: [id number] Date of Birth: 02-Jul-1930   Medicare Important Message Given:  Yes    [name] 12/16/2018, 3:50 PM

## 2018-12-17 LAB — CBC
HCT: 26.7 % — ABNORMAL LOW (ref 36.0–46.0)
Hemoglobin: 8.4 g/dL — ABNORMAL LOW (ref 12.0–15.0)
MCH: 29.3 pg (ref 26.0–34.0)
MCHC: 31.5 g/dL (ref 30.0–36.0)
MCV: 93 fL (ref 80.0–100.0)
Platelets: 299 10*3/uL (ref 150–400)
RBC: 2.87 MIL/uL — ABNORMAL LOW (ref 3.87–5.11)
RDW: 16.7 % — ABNORMAL HIGH (ref 11.5–15.5)
WBC: 6.8 10*3/uL (ref 4.0–10.5)
nRBC: 0 % (ref 0.0–0.2)

## 2018-12-17 LAB — BASIC METABOLIC PANEL
Anion gap: 7 (ref 5–15)
BUN: 23 mg/dL (ref 8–23)
CO2: 25 mmol/L (ref 22–32)
Calcium: 8.1 mg/dL — ABNORMAL LOW (ref 8.9–10.3)
Chloride: 106 mmol/L (ref 98–111)
Creatinine, Ser: 1.12 mg/dL — ABNORMAL HIGH (ref 0.44–1.00)
GFR calc Af Amer: 52 mL/min — ABNORMAL LOW (ref >60)
GFR calc non Af Amer: 44 mL/min — ABNORMAL LOW (ref >60)
Glucose, Bld: 167 mg/dL — ABNORMAL HIGH (ref 70–99)
Potassium: 3.9 mmol/L (ref 3.5–5.1)
Sodium: 138 mmol/L (ref 135–145)

## 2018-12-17 LAB — PROCALCITONIN: Procalcitonin: 0.1 ng/mL

## 2018-12-17 MED ORDER — LEFLUNOMIDE 20 MG PO TABS
20.0000 mg | ORAL_TABLET | Freq: Every day | ORAL | Status: DC
  Administered 2018-12-17 – 2018-12-20 (×4): 20 mg via ORAL
  Filled 2018-12-17 (×4): qty 1

## 2018-12-17 NOTE — Progress Notes (Signed)
Patient weight is 85.9 kg way over the last weight in 4 days ago ( ie 78 kg). Weight was taken 2 times and the bed reading was the same.  Patient stated she feels heavy. This will be communicated the incoing RN to notify the provider. No c/o SOB or s/s of it. Will continue to monitor.

## 2018-12-17 NOTE — Progress Notes (Signed)
[facility] PROGRESS NOTE  [name] [name] ZRA:[id number] DOB: 1930/03/07 DOA: 12/11/2018 PCP: Briscoe Deutscher, DO  Assessment/Plan: 1. Persistent fever, resolved. Afebrile > 48 hours.  Procalcitonin not suggestive of infection. Blood cultures remained negative. Wrist nodule more consistent with RA not abscess after hand surgery evaluation. Cefepime d/c'd on 2/7. Suspect this was related to RA flare and has responded well tol increase inprednisone from 20 to 40. Encouraged by no localizing signs or symptoms of infection, no white count, unremarkable CXR, wnl ua, negative blood cultures to date.  2. Enhancing fluid collection of right wrist.  Presumed to be abscess based off MR imaging which prompted [facility].  Dr. Caralyn Guile with surgery evaluated at bedside with serosanguinous fluid on aspiration and crystal-like material consistent with inflammatory arhtopathy.  Patient reports this nodule has been present for over a year related to her known RA disease. Clinically this does not seem to be an abscess it is not tender, there is no erythema and noted aspiration of fluid findings.   3. Right extremity weakness, subacute, improving. Has been ongoing since January per documentation and patient. Improved some in past 24 hours. Repeat MRI imaging shows stable lumbar stenosis. Will treat as possible link to RA flare as mentioned above. Noted improvement with therapy at rehab facility.PT recommends SNF  4. Chronic combined systolic/diastolic heart failure.  Euvolemic on exam.  TTE, 08/2018 with EF 60-65% (improved from 07/2018 with EF of 35-40%), grade 1 diastolic dysfunction.  Continue home Lasix 20 mg daily, Entresto, Coreg Daily weights, strict I's and O's. Cardiology gave clearance for any spine surgery  5. Rheumatoid arthritis, suspect acute flare given persistent fevers and aches, ESR elevated ( not specific).  Continue home Plaquenil.  Increased prednisone of 4m. Resume leflunomide    6. Hypertension, stable.  Continue home hydralazine, Coreg  7. History of right lower lobe PE and Right DVT (07/2018).  Normal respiratory exam, no SOB. Continue home Xarelto, initial plan to continue for at least 6 months before considering discontinuation (CTA on 07/2018 showed small  PE in right lower lobe, repeat CTA on 08/2018 showed resolution)  8. Gout, stable.  Continue home allopurinol  9. Lumbar Spinal stenosis of L4-L5, L5-S1. No claudication present. No bowel/urinary incontinence.  Diagnosed on 10/2018 with no signs of cord compression and repeat MRI from 11/2018 showed no changes.  Dr. GGladstone Lighterplanning for decompressive laminectomy as outpatient, but she will need to be off xarelto for at least a week per Dr. GGladstone Lighterprior to any surgery. Repeat MRI spine here shows stable stenosis   10. T2DM, not on insulin.  A1c 5.9% 06/2018 CBG monitoring, SSI, diabetic diet  11. CKD, stage III.  GFR range 43-51.  Creatinine stable at baseline 0.9-1.1.  Avoid nephrotoxins, monitor BMP.  12. Anemia of chronic disease. Likely related to RA (iron panel from 10/2018 shows low iron, normal ferritin, hgb stable, daily CBC  13. CAD. MI in 1970a. Nonobstructive on cath in 2019.No chest pain. Continue imdur and carvedilol  14. Pain/psych. Continue home gabapentin  Code Status: FULL Family Communication: left message for Daughter PFelicity Pellegriniat [phone] Plan: add leflunomide, s/w consult for SNF, discuss with family, medically stable   Consultants:  Hand surgery(dr. OCaralyn Guile, cardiology  Procedures:  Bedside aspiration of right wrist nodule- consistent with crystal like material, sero sanguinous fluid  Antibiotics:  IV vancomycin, 2/4-2/5  IV cefepime 2/4-2/7  HPI/Subjective:  [name] SCHOLLERis a 85y.o. year old female with  medical history significant for combined systolic/diastolic CHF, CAD, PE on xarelto, diabetes,  RA on leflunomide, Plaquenil, and prednisone,  prior stroke, HTN, gout, severe lumbar spinal stenosis who presented on 12/11/2018 with reports of weakness to right leg and difficulty ambulating/transferring per family after recent discharge from skilled nursing facility and was found to have decreased ability to bear weight on right leg and reported fever. She was discharged from her SNF on Saturday and reports difficulty transferring for several weeks due to decreased ability to bear weight on right leg ( in SNF she was transferred to wheelchair for ambulation). Denies any urine/bowel incontinence. She does not recall any fevers. Denies any localizing symptoms other than mild cough. Reports adherence to all her medications, states her last weight was in the 170s prior to discharge from the SNF. No pain in her right wrist [facility] for surgical debridement of presumed right wrist abscess as potential etiology for recurrent fevers. Evaluated at bedside on 12/14/18 with aspration of serosanguineous fluid with no concern for actual infection/abscess. Found to have a chronic longstanding nodule related to her RA  Continued improvement in right leg but still not able to bear weight. No CP, SOB, fevers or chills.   Objective: Vitals:   12/17/18 0459 12/17/18 1159  BP: (!) 165/82 131/73  Pulse: 72 75  Resp:  16  Temp: 98.2 F (36.8 C) 97.8 F (36.6 C)  SpO2: 99% 100%    Intake/Output Summary (Last 24 hours) at 12/17/2018 1419 Last data filed at 12/17/2018 1100 Gross per 24 hour  Intake 840 ml  Output 900 ml  Net -60 ml   Filed Weights   12/11/18 0952 12/13/18 1525 12/17/18 0500  Weight: 78 kg 78 kg 85.9 kg    Exam:   General:  Lying in bed in no distress  Cardiovascular: RRR, no peripheral edema  Respiratory: normal respiratory effort on room air, no crackles or wheezing  Abdomen: soft, obese abdomen  Musculoskeletal: normal ROM  Skin right wrist with palpable nodule, nontender with no surrounding erythema or  fluctuance  Neurologic decreased strength in right leg but able to lift against resistance, sensation intact  Data Reviewed: Basic Metabolic Panel: Recent Labs  Lab 12/11/18 1103 12/13/18 0845 12/14/18 0613 12/16/18 0159 12/17/18 0712  NA 136 134* 134* 139 138  K 3.7 3.5 3.7 3.9 3.9  CL 103 102 104 104 106  CO2 _0 GLUCOSE 99 109* 178* 142* 167*  BUN _1 CREATININE 0.98 1.15* 1.00 1.22* 1.12*  CALCIUM 8.4* 8.1* 7.9* 8.2* 8.1*   Liver Function Tests: Recent Labs  Lab 12/11/18 1103 12/13/18 0845 12/14/18 0613  AST _2 ALT _3 ALKPHOS 42 36* 39  BILITOT 0.9 0.5 0.3  PROT 6.1* 5.5* 5.5*  ALBUMIN 3.1* 2.6* 2.4*   No results for input(s): LIPASE, AMYLASE in the last 168 hours. No results for input(s): AMMONIA in the last 168 hours. CBC: Recent Labs  Lab 12/11/18 1103 12/13/18 0845 12/14/18 2774 12/15/18 0203 12/16/18 0159 12/17/18 0712  WBC 6.1 5.3 4.9 6.7 6.5 6.8  NEUTROABS 3.6  --   --   --   --   --   HGB 9.2* 8.5* 8.3* 7.9* 7.8* 8.4*  HCT 29.0* 27.5* 26.0* 24.2* 24.0* 26.7*  MCV 98.6 97.5 96.3 91.7 93.0 93.0  PLT 298 239 229 249 268 299   Cardiac Enzymes: No results for input(s): CKTOTAL,  CKMB, CKMBINDEX, TROPONINI in the last 168 hours. BNP (last 3 results) Recent Labs    05/28/18 1614 06/22/18 2153 07/23/18 2100  BNP 2,077.6* 1,146.7* 138.3*    ProBNP (last 3 results) No results for input(s): PROBNP in the last 8760 hours.  CBG: Recent Labs  Lab 12/11/18 0956 12/14/18 1714  GLUCAP 89 149*    Recent Results (from the past 240 hour(s))  Blood culture (routine x 2)     Status: None (Preliminary result)   Collection Time: 12/13/18  8:43 AM  Result Value Ref Range Status   Specimen Description   Final    BLOOD RIGHT ARM Performed at [facility] [address] [place], [place] [address]    Special Requests   Final    BOTTLES DRAWN AEROBIC AND ANAEROBIC Blood Culture adequate  volume Performed at [facility] [address], [place], [place] [address]    Culture   Final    NO GROWTH 3 DAYS Performed at [facility], [place] [address], [place], [place] [address]    Report Status PENDING  Incomplete  Blood culture (routine x 2)     Status: None (Preliminary result)   Collection Time: 12/13/18  8:45 AM  Result Value Ref Range Status   Specimen Description   Final    BLOOD LEFT HAND Performed at [facility] [address] [place], [place] [address]    Special Requests   Final    BOTTLES DRAWN AEROBIC AND ANAEROBIC BCAV Performed at [facility], [place] [address] [place], [place] [address]    Culture   Final    NO GROWTH 3 DAYS Performed at [facility], [place] [address] [place], [place] [address]    Report Status PENDING  Incomplete  Urine culture     Status: None   Collection Time: 12/13/18 11:11 AM  Result Value Ref Range Status   Specimen Description   Final    URINE, RANDOM Performed at [facility] [address], [place], [place] [address]    Special Requests   Final    NONE Performed at [facility], [place] [address] [place], [place] [address]    Culture   Final    NO GROWTH Performed at [facility], [place] [address], [place], [place] [address]    Report Status 12/14/2018 FINAL  Final  Surgical pcr screen     Status: None   Collection Time: 12/14/18  1:40 PM  Result Value Ref Range Status   MRSA, PCR NEGATIVE NEGATIVE Final   Staphylococcus aureus NEGATIVE NEGATIVE Final    Comment: (NOTE) The Xpert SA Assay (FDA approved for NASAL specimens in patients 64 years of age and older), is one component of a comprehensive surveillance program. It is not intended to diagnose infection nor to guide or monitor treatment. Performed at [facility], [place] [address], [place], [place] [address]      Studies: Mr  Lumbar Spine Wo Contrast  Result Date: 12/16/2018 CLINICAL DATA:  Back pain.  Inability to bear weight on legs. EXAM: MRI LUMBAR SPINE WITHOUT CONTRAST TECHNIQUE: Multiplanar, multisequence MR imaging of the lumbar spine was performed. No intravenous contrast was administered. COMPARISON:  Two recent examinations: MRI 11/15/2018 and 10/21/2018 FINDINGS: Segmentation: The same numbering system is use as the prior MRIs with partial lumbarization of S1 and a near full disc space at S1-2. Alignment: Stable advanced degenerative lumbar  spondylosis with multilevel disc disease and facet disease. Degenerative anterolisthesis of L4 and L5 is stable along with mild degenerative retrolisthesis of L2. Vertebrae: Stable compression deformity of L1 with L1 hemangioma. No acute lumbar spine fracture. No worrisome bone lesions. Conus medullaris and cauda equina: Conus extends to the L1-2 level. Conus and cauda equina appear normal. Paraspinal and other soft tissues: No significant findings. Stable right renal cyst. Disc levels: L1-2: Mild facet disease but no disc protrusions, spinal or foraminal stenosis. No change since recent MRIs. L2-3: Advanced degenerate disc disease and moderate facet disease. There is a bulging uncovered disc and osteophytic ridging with mild flattening of the ventral thecal sac and mild bilateral lateral recess encroachment. There is also mild foraminal encroachment, left greater than right. This appears stable. L3-4: Stable bulging degenerated annulus, osteophytic ridging, short pedicles and facet disease contributing to moderate spinal and bilateral lateral recess stenosis. No significant foraminal stenosis. L4-5: Degenerative anterolisthesis of L4 with a bulging uncovered disc. This in combination with short pedicles and severe facet disease contributes to moderately severe spinal and bilateral lateral recess stenosis and moderate bilateral foraminal stenosis. These findings are stable. L5-S1:  Degenerative anterolisthesis of L5 with a bulging uncovered disc. There is also severe facet disease and short pedicles contributing to severe spinal and bilateral lateral recess stenosis and moderate bilateral foraminal stenosis. These findings appear stable. S1-S2: Facet disease but no disc protrusions or foraminal stenosis. IMPRESSION: 1. Stable advanced degenerative lumbar spondylosis with multilevel disc disease and facet disease. 2. Stable compression deformity of L1.  No acute bony abnormalities. 3. Stable multifactorial moderate spinal and bilateral lateral recess stenosis at L3-4. 4. Stable multifactorial moderately severe spinal and bilateral lateral recess stenosis at L4-5. There is also moderate bilateral foraminal stenosis. 5. Stable multifactorial severe spinal and bilateral lateral recess stenosis at L5-S1 and moderate bilateral foraminal stenosis. Electronically Signed   By: Marijo Sanes [name]   On: 12/16/2018 11:01    Scheduled Meds: . allopurinol  100 mg Oral Daily  . carvedilol  25 mg Oral BID WC  . cholecalciferol  1,000 Units Oral Daily  . docusate sodium  100 mg Oral BID  . feeding supplement (ENSURE ENLIVE)  237 mL Oral QPM  . furosemide  20 mg Oral Daily  . gabapentin  400 mg Oral TID  . hydrALAZINE  100 mg Oral TID  . HYDROcodone-acetaminophen  1 tablet Oral Q6H  . hydroxychloroquine  200 mg Oral BID  . isosorbide mononitrate  30 mg Oral QHS  . Melatonin  1 tablet Oral QHS  . olopatadine  1 drop Both Eyes BID  . polyvinyl alcohol  1 drop Both Eyes Daily  . predniSONE  40 mg Oral Q breakfast  . rivaroxaban  20 mg Oral Q supper  . sacubitril-valsartan  1 tablet Oral Q1200  . senna  1 tablet Oral BID  . vitamin C  500 mg Oral Daily   Continuous Infusions:   Active Problems:   Type 2 diabetes mellitus with hyperlipidemia ([place]), patient declines statin   Hypertension associated with diabetes ([place])   CAD (coronary artery disease)   Rheumatoid arthritis involving  multiple sites ([place])   CKD stage 3 due to type 2 diabetes mellitus ([place])   DM (diabetes mellitus), type 2 with renal complications ([facility])   Chronic combined systolic and diastolic CHF (congestive heart failure) ([facility])   Benign hypertensive heart and kidney disease with CHF and stage 3 chronic kidney disease ([place])   Deep vein  thrombosis (DVT) of distal vein of right lower extremity ([place])   Pulmonary embolism ([place]), 07/2018, on Xarelto with goal to DC after 6 months if more active   Refusal of statin medication by patient   Rheumatoid arthritis involving both knees ([place])   Spinal stenosis, lumbar   Fever   Abscess of right upper extremity      Desiree Hane  [facility]

## 2018-12-17 NOTE — Progress Notes (Signed)
Physical Therapy Treatment Patient Details Name: [name] CluckHazel C Prisk MRN: [id number] DOB: 1930-10-14 Today's Date: 12/17/2018    History of Present Illness [name] [name] is a 85 y.o. female with history of rheumatoid arthritis, gout, diabetes mellitus type 2, diastolic CHF, PE on Xarelto, history of CVA, iron deficiency, hypertension, nonobstructive CAD was recently admitted last month for low back pain at the time MRI of the lumbar spine showed degenerative disc disease and patient was eventually discharged to rehab , [facility]. Per family, patient unable to ambulate after DC. Pt dx with persistant fever (of unknown origin), fluid collection R wrist (presumed abcess based on MR imaging), aspiration of R wirst consistant with inflammatory pathology, R extremity weakness (leg and arm) subacute, and chronic combined systolic and diastolic heart failure.    PT Comments    Pt did a bit better today with mobility and was even able to walk a short distance with her RW (5').  She remains appropriate for SNF level rehab at discharge.  PT will continue to follow acutely for safe mobility progression  Follow Up Recommendations  SNF     Equipment Recommendations  None recommended by PT    Recommendations for Other Services   NA     Precautions / Restrictions Precautions Precautions: Fall Precaution Comments: generalized pain in joints, right leg seems weaker than left leg    Mobility  Bed Mobility Overal bed mobility: Needs Assistance Bed Mobility: Supine to Sit     Supine to sit: Min assist;HOB elevated     General bed mobility comments: Min assist to come to sitting EOB.  Pt progressing her own legs, posterior LOB throughout coming to EOB, so most assist at trunk.    Transfers Overall transfer level: Needs assistance Equipment used: Rolling walker (2 wheeled) Transfers: Sit to/from Stand Sit to Stand: Mod assist;From elevated surface         General transfer comment: Mod  assist to support trunk to power up to standing and stabilize trunk as pt gets her feet under her body with RW.  Had to repeat x 2 to adjust RW down to better fit her height.    Ambulation/Gait Ambulation/Gait assistance: Mod assist;+2 physical assistance Gait Distance (Feet): 5 Feet Assistive device: Rolling walker (2 wheeled) Gait Pattern/deviations: Step-to pattern;Shuffle;Antalgic     General Gait Details: Pt with difficulty WB through R LE due to pain and weakness on this side.  She was able to take some weak steps forward away from the bed with recliner pulled up behind her.            Balance Overall balance assessment: Needs assistance Sitting-balance support: Feet supported;Bilateral upper extremity supported Sitting balance-Leahy Scale: Fair     Standing balance support: Bilateral upper extremity supported Standing balance-Leahy Scale: Poor Standing balance comment: very heavily reliant on UE support on RW and assist from therapist.                             Cognition Arousal/Alertness: Awake/alert Behavior During Therapy: WFL for tasks assessed/performed Overall Cognitive Status: Within Functional Limits for tasks assessed                                 General Comments: Not specifically tested, but conversation is normal and pt is alert and cooperative.      Exercises Total Joint Exercises Ankle  Circles/Pumps: AROM;Both;20 reps General Exercises - Lower Extremity Long Arc Quad: AROM;Both;10 reps        Pertinent Vitals/Pain Pain Assessment: Faces Faces Pain Scale: Hurts little more Pain Location: R wrist, hand and leg Pain Descriptors / Indicators: Grimacing;Guarding Pain Intervention(s): Limited activity within patient's tolerance;Monitored during session;Repositioned           PT Goals (current goals can now be found in the care plan section) Acute Rehab PT Goals Patient Stated Goal: to get back to walking  again Progress towards PT goals: Progressing toward goals    Frequency    Min 2X/week      PT Plan Current plan remains appropriate       AM-PAC PT "6 Clicks" Mobility   Outcome Measure  Help needed turning from your back to your side while in a flat bed without using bedrails?: A Little Help needed moving from lying on your back to sitting on the side of a flat bed without using bedrails?: A Little Help needed moving to and from a bed to a chair (including a wheelchair)?: A Lot Help needed standing up from a chair using your arms (e.g., wheelchair or bedside chair)?: A Lot Help needed to walk in hospital room?: A Lot Help needed climbing 3-5 steps with a railing? : Total 6 Click Score: 13    End of Session Equipment Utilized During Treatment: Gait belt Activity Tolerance: Patient limited by pain;Patient limited by fatigue Patient left: in chair;with call bell/phone within reach Nurse Communication: Mobility status PT Visit Diagnosis: Other abnormalities of gait and mobility (R26.89);Muscle weakness (generalized) (M62.81) Pain - Right/Left: Right Pain - part of body: Leg     Time: 0488-8916 PT Time Calculation (min) (ACUTE ONLY): 17 min  Charges:  $Therapeutic Activity: 8-22 mins           Correna Meacham B. Rosielee Corporan, PT, DPT  Acute Rehabilitation [phone] pager #[phone] office             12/17/2018, 2:08 PM

## 2018-12-18 LAB — CULTURE, BLOOD (ROUTINE X 2)
Culture: NO GROWTH
Culture: NO GROWTH
Special Requests: ADEQUATE

## 2018-12-18 LAB — CREATININE, SERUM
Creatinine, Ser: 1.13 mg/dL — ABNORMAL HIGH (ref 0.44–1.00)
GFR calc Af Amer: 51 mL/min — ABNORMAL LOW (ref >60)
GFR calc non Af Amer: 44 mL/min — ABNORMAL LOW (ref >60)

## 2018-12-18 MED ORDER — LORATADINE 10 MG PO TABS
10.0000 mg | ORAL_TABLET | Freq: Every day | ORAL | Status: DC
  Administered 2018-12-18 – 2018-12-20 (×3): 10 mg via ORAL
  Filled 2018-12-18 (×3): qty 1

## 2018-12-18 NOTE — Progress Notes (Signed)
[facility] PROGRESS NOTE  [name] [name] JHE:[id number] DOB: [birth year] DOA: 12/11/2018 PCP: Briscoe Deutscher, DO  Assessment/Plan: 1. Persistent fever, resolved. Afebrile > 48 hours.  Procalcitonin not suggestive of infection. Blood cultures remained negative. Wrist nodule more consistent with RA not abscess after hand surgery evaluation. Cefepime d/c'd on 2/7. Suspect this was related to RA flare and has responded well to increase inprednisone from 20 to 40 on 2/8. Encouraged by no localizing signs or symptoms of infection, no white count, unremarkable CXR, wnl ua, negative blood cultures to date.  2. Enhancing fluid collection of right wrist.  Presumed to be abscess based off MR imaging which [facility].  Dr. Caralyn Guile with surgery evaluated at bedside with serosanguinous fluid on aspiration and crystal-like material consistent with inflammatory arhtopathy.  Patient reports this nodule has been present for over a year related to her known RA disease. Clinically this does not seem to be an abscess it is not tender, there is no erythema and noted aspiration of fluid findings.   3. Right extremity weakness, subacute, improving. Has been ongoing since January per documentation and patient. Improved some in past 24 hours. Repeat MRI imaging shows stable lumbar stenosis. Will treat as possible link to RA flare as mentioned above. Noted improvement with therapy at rehab facility.PT recommends SNF  4. Chronic combined systolic/diastolic heart failure.  Euvolemic on exam.  TTE, 08/2018 with EF 60-65% (improved from 07/2018 with EF of 35-40%), grade 1 diastolic dysfunction.  Continue home Lasix 20 mg daily, Entresto, Coreg Daily weights, strict I's and O's. Cardiology gave clearance for any spine surgery  5. Rheumatoid arthritis, suspect acute flare given persistent fevers and aches, ESR elevated ( not specific).  Continue home Plaquenil.  Prednisone 47m. Home leflunomide    6. Hypertension, stable.  Continue home hydralazine, Coreg  7. History of right lower lobe PE and Right DVT (07/2018).  Normal respiratory exam, no SOB. Continue home Xarelto, initial plan to continue for at least 6 months before considering discontinuation (CTA on 07/2018 showed small  PE in right lower lobe, repeat CTA on 08/2018 showed resolution)  8. Gout, stable.  Continue home allopurinol  9. Lumbar Spinal stenosis of L4-L5, L5-S1. No claudication present. No bowel/urinary incontinence.  Diagnosed on 10/2018 with no signs of cord compression and repeat MRI from 11/2018 showed no changes.  Dr. GGladstone Lighterplanning for decompressive laminectomy as outpatient, but she will need to be off xarelto for at least a week per Dr. GGladstone Lighterprior to any surgery. Repeat MRI spine here shows stable stenosis   10. T2DM, not on insulin.  A1c 5.9% 06/2018 CBG monitoring, SSI, diabetic diet  11. CKD, stage III.  GFR range 43-51.  Creatinine stable at baseline 0.9-1.1.  Avoid nephrotoxins, monitor BMP.  12. Anemia of chronic disease. Likely related to RA (iron panel from 10/2018 shows low iron, normal ferritin, hgb stable, daily CBC  13. CAD. MI in 1970a. Nonobstructive on cath in 2019.No chest pain. Continue imdur and carvedilol  14. Pain/psych. Continue home gabapentin  Code Status: FULL Family Communication: spoke with  Daughter PFelicity Pellegriniat [phone] Plan: , s/w consult for SNF,, medically stable   Consultants:  Hand surgery(dr. OCaralyn Guile, cardiology  Procedures:  Bedside aspiration of right wrist nodule- consistent with crystal like material, sero sanguinous fluid  Antibiotics:  IV vancomycin, 2/4-2/5  IV cefepime 2/4-2/7  HPI/Subjective:  HFARHANA FELLOWSis a 85y.o. year old female with medical history significant for  combined systolic/diastolic CHF, CAD, PE on xarelto, diabetes,  RA on leflunomide, Plaquenil, and prednisone, prior stroke, HTN, gout, severe lumbar  spinal stenosis who presented on 12/11/2018 with reports of weakness to right leg and difficulty ambulating/transferring per family after recent discharge from skilled nursing facility and was found to have decreased ability to bear weight on right leg and reported fever. She was discharged from her SNF on Saturday and reports difficulty transferring for several weeks due to decreased ability to bear weight on right leg ( in SNF she was transferred to wheelchair for ambulation). Denies any urine/bowel incontinence. She does not recall any fevers. Denies any localizing symptoms other than mild cough. Reports adherence to all her medications, states her last weight was in the 170s prior to discharge from the SNF. No pain in her right wrist [facility] for surgical debridement of presumed right wrist abscess as potential etiology for recurrent fevers. Evaluated at bedside on 12/14/18 with aspration of serosanguineous fluid with no concern for actual infection/abscess. Found to have a chronic longstanding nodule related to her RA  r leg feels better this am. No CP or SOB.   Objective: Vitals:   12/18/18 0639 12/18/18 1351  BP: 137/69 125/65  Pulse: 73 76  Resp: 18 16  Temp: 98.3 F (36.8 C) 97.6 F (36.4 C)  SpO2: 98% 99%    Intake/Output Summary (Last 24 hours) at 12/18/2018 1532 Last data filed at 12/18/2018 1100 Gross per 24 hour  Intake 480 ml  Output 1000 ml  Net -520 ml   Filed Weights   12/13/18 1525 12/17/18 0500 12/18/18 0356  Weight: 78 kg 85.9 kg 84.3 kg    Exam:   General:  Lying in bed in no distress  Cardiovascular: RRR, no peripheral edema  Respiratory: normal respiratory effort on room air, no crackles or wheezing  Abdomen: soft, obese abdomen  Musculoskeletal: normal ROM  Skin right wrist with palpable nodule, nontender with no surrounding erythema or fluctuance  Neurologic decreased strength in right leg but able to lift against resistance, sensation  intact  Data Reviewed: Basic Metabolic Panel: Recent Labs  Lab 12/13/18 0845 12/14/18 0613 12/16/18 0159 12/17/18 0712 12/18/18 0428  NA 134* 134* 139 138  --   K 3.5 3.7 3.9 3.9  --   CL 102 104 104 106  --   CO2 24 22 24 25   --   GLUCOSE 109* 178* 142* 167*  --   BUN 13 17 22 23   --   CREATININE 1.15* 1.00 1.22* 1.12* 1.13*  CALCIUM 8.1* 7.9* 8.2* 8.1*  --    Liver Function Tests: Recent Labs  Lab 12/13/18 0845 12/14/18 0613  AST 16 19  ALT 9 11  ALKPHOS 36* 39  BILITOT 0.5 0.3  PROT 5.5* 5.5*  ALBUMIN 2.6* 2.4*   No results for input(s): LIPASE, AMYLASE in the last 168 hours. No results for input(s): AMMONIA in the last 168 hours. CBC: Recent Labs  Lab 12/13/18 0845 12/14/18 7793 12/15/18 0203 12/16/18 0159 12/17/18 0712  WBC 5.3 4.9 6.7 6.5 6.8  HGB 8.5* 8.3* 7.9* 7.8* 8.4*  HCT 27.5* 26.0* 24.2* 24.0* 26.7*  MCV 97.5 96.3 91.7 93.0 93.0  PLT 239 229 249 268 299   Cardiac Enzymes: No results for input(s): CKTOTAL, CKMB, CKMBINDEX, TROPONINI in the last 168 hours. BNP (last 3 results) Recent Labs    05/28/18 1614 06/22/18 2153 07/23/18 2100  BNP 2,077.6* 1,146.7* 138.3*    ProBNP (  last 3 results) No results for input(s): PROBNP in the last 8760 hours.  CBG: Recent Labs  Lab 12/14/18 1714  GLUCAP 149*    Recent Results (from the past 240 hour(s))  Blood culture (routine x 2)     Status: None (Preliminary result)   Collection Time: 12/13/18  8:43 AM  Result Value Ref Range Status   Specimen Description   Final    BLOOD RIGHT ARM Performed at [facility] [address], [place], [place] [address]    Special Requests   Final    BOTTLES DRAWN AEROBIC AND ANAEROBIC Blood Culture adequate volume Performed at [facility] [address], [place], [place] [address]    Culture   Final    NO GROWTH 4 DAYS Performed at [facility], [place] [address], [place], [place] [address]    Report Status  PENDING  Incomplete  Blood culture (routine x 2)     Status: None (Preliminary result)   Collection Time: 12/13/18  8:45 AM  Result Value Ref Range Status   Specimen Description   Final    BLOOD LEFT HAND Performed at [facility] [address], [place], [place] [address]    Special Requests   Final    BOTTLES DRAWN AEROBIC AND ANAEROBIC BCAV Performed at [facility], [place] [address], [place], [place] [address]    Culture   Final    NO GROWTH 4 DAYS Performed at [facility], [place] [address], [place], [place] [address]    Report Status PENDING  Incomplete  Urine culture     Status: None   Collection Time: 12/13/18 11:11 AM  Result Value Ref Range Status   Specimen Description   Final    URINE, RANDOM Performed at [facility] [address], [place], [place] [address]    Special Requests   Final    NONE Performed at [facility], [place] [address] [place], [place] [address]    Culture   Final    NO GROWTH Performed at [facility], [place] [address], [place], [place] [address]    Report Status 12/14/2018 FINAL  Final  Surgical pcr screen     Status: None   Collection Time: 12/14/18  1:40 PM  Result Value Ref Range Status   MRSA, PCR NEGATIVE NEGATIVE Final   Staphylococcus aureus NEGATIVE NEGATIVE Final    Comment: (NOTE) The Xpert SA Assay (FDA approved for NASAL specimens in patients 31 years of age and older), is one component of a comprehensive surveillance program. It is not intended to diagnose infection nor to guide or monitor treatment. Performed at [facility], [place] [address] [place], [place] [address]      Studies: No results found.  Scheduled Meds: . allopurinol  100 mg Oral Daily  . carvedilol  25 mg Oral BID WC  . cholecalciferol  1,000 Units Oral Daily  . docusate sodium  100 mg Oral BID  . feeding supplement (ENSURE ENLIVE)  237 mL Oral QPM   . furosemide  20 mg Oral Daily  . gabapentin  400 mg Oral TID  . hydrALAZINE  100 mg Oral TID  . HYDROcodone-acetaminophen  1 tablet Oral Q6H  . hydroxychloroquine  200 mg Oral BID  . isosorbide mononitrate  30 mg Oral QHS  . leflunomide  20 mg Oral Daily  . loratadine  10 mg Oral Daily  .  Melatonin  1 tablet Oral QHS  . olopatadine  1 drop Both Eyes BID  . polyvinyl alcohol  1 drop Both Eyes Daily  . predniSONE  40 mg Oral Q breakfast  . rivaroxaban  20 mg Oral Q supper  . sacubitril-valsartan  1 tablet Oral Q1200  . senna  1 tablet Oral BID  . vitamin C  500 mg Oral Daily   Continuous Infusions:   Active Problems:   Type 2 diabetes mellitus with hyperlipidemia ([place]), patient declines statin   Hypertension associated with diabetes ([place])   CAD (coronary artery disease)   Rheumatoid arthritis involving multiple sites [facility])   CKD stage 3 due to type 2 diabetes mellitus ([place])   DM (diabetes mellitus), type 2 with renal complications (H[facility])   Chronic combined systolic and diastolic CHF (congestive heart failure) (H[facility])   Benign hypertensive heart and kidney disease with CHF and stage 3 chronic kidney disease ([place])   Deep vein thrombosis (DVT) of distal vein of right lower extremity ([place])   Pulmonary embolism ([place]), 07/2018, on Xarelto with goal to DC after 6 months if more active   Refusal of statin medication by patient   Rheumatoid arthritis involving both knees ([place])   Spinal stenosis, lumbar   Fever   Abscess of right upper extremity      Desiree Hane  [facility]

## 2018-12-18 NOTE — Plan of Care (Signed)
  Problem: Clinical Measurements: Goal: Respiratory complications will improve Outcome: Progressing   Problem: Pain Managment: Goal: General experience of comfort will improve Outcome: Progressing   Problem: Safety: Goal: Ability to remain free from injury will improve Outcome: Progressing   Problem: Skin Integrity: Goal: Risk for impaired skin integrity will decrease Outcome: Progressing   

## 2018-12-19 ENCOUNTER — Ambulatory Visit: Payer: Self-pay

## 2018-12-19 NOTE — Progress Notes (Signed)
[facility] PROGRESS NOTE  [name] [name] FAO:[id number] DOB: 10-18-1930 DOA: 12/11/2018 PCP: Briscoe Deutscher, DO  Assessment/Plan: 1. Persistent fever, resolved. Afebrile > more than 48 hours.  Procalcitonin not suggestive of infection. Blood cultures remained negative. Wrist nodule more consistent with RA not abscess after hand surgery evaluation. Cefepime d/c'd on 2/7. Suspect this was related to RA flare and has responded well to increase inprednisone from 20 to 40 on 2/8. Encouraged by no localizing signs or symptoms of infection, no white count, unremarkable CXR, wnl ua, negative blood cultures to date.  2. Enhancing fluid collection of right wrist secondary to RA.  Presumed to be abscess based off MR imaging which prompted transfer from [facility] to [facility]  Dr. Caralyn Guile with surgery evaluated at bedside with serosanguinous fluid on aspiration and crystal-like material consistent with inflammatory arhtopathy.  Patient reports this nodule has been present for over a year related to her known RA disease. Clinically this does not seem to be an abscess it is not tender, there is no erythema and noted aspiration of fluid findings.   3. Right extremity weakness, subacute, improving. Has been ongoing since January per documentation and patient. Improved some in past 24 hours. Repeat MRI imaging shows stable lumbar stenosis. Will treat as possible link to RA flare as mentioned above. Noted improvement with therapy at rehab facility.PT recommends SNF  4. Chronic combined systolic/diastolic heart failure, stable.  Euvolemic on exam.  TTE, 08/2018 with EF 60-65% (improved from 07/2018 with EF of 35-40%), grade 1 diastolic dysfunction.  Continue home Lasix 20 mg daily, Entresto, Coreg Daily weights, strict I's and O's. Cardiology gave clearance for any spine surgery  5. Rheumatoid arthritis, suspect acute flare given persistent fevers and aches, ESR elevated ( not specific).  Continue home Plaquenil.  Prednisone  40m, will slowly taper. Home leflunomide   6. Hypertension, stable.  Continue home hydralazine, Coreg  7. History of right lower lobe PE and Right DVT (07/2018).  Normal respiratory exam, no SOB. Continue home Xarelto, initial plan to continue for at least 6 months before considering discontinuation (CTA on 07/2018 showed small  PE in right lower lobe, repeat CTA on 08/2018 showed resolution)  8. Gout, stable.  Continue home allopurinol  9. Lumbar Spinal stenosis of L4-L5, L5-S1. No claudication present. No bowel/urinary incontinence.  Diagnosed on 10/2018 with no signs of cord compression and repeat MRI from 11/2018 showed no changes.  Dr. GGladstone Lighterplanning for decompressive laminectomy as outpatient, but she will need to be off xarelto for at least a week per Dr. GGladstone Lighterprior to any surgery. Repeat MRI spine here shows stable stenosis   10. T2DM, not on insulin.  A1c 5.9% 06/2018 CBG monitoring, SSI, diabetic diet  11. CKD, stage III.  GFR range 43-51.  Creatinine stable at baseline 0.9-1.1.  Avoid nephrotoxins, monitor BMP.  12. Anemia of chronic disease. Likely related to RA (iron panel from 10/2018 shows low iron, normal ferritin, hgb stable, daily CBC  13. CAD. MI in 1970a. Nonobstructive on cath in 2019.No chest pain. Continue imdur and carvedilol  14. Pain/psych. Continue home gabapentin  Code Status: FULL Family Communication: No family at bedside  Disposition Plan: , s/w consult for SNF,medically stable   Consultants:  Hand surgery(dr. OCaralyn Guile, cardiology  Procedures:  Bedside aspiration of right wrist nodule- consistent with crystal like material, sero sanguinous fluid  Antibiotics:  IV vancomycin, 2/4-2/5  IV cefepime 2/4-2/7  HPI/Subjective:  [name] WOODROOFis a 85y.o. year old female  with medical history significant for combined systolic/diastolic CHF, CAD, PE on xarelto, diabetes,  RA on leflunomide, Plaquenil, and prednisone, prior stroke, HTN, gout, severe  lumbar spinal stenosis who presented on 12/11/2018 with reports of weakness to right leg and difficulty ambulating/transferring per family after recent discharge from skilled nursing facility and was found to have decreased ability to bear weight on right leg and reported fever. She was discharged from her SNF on Saturday and reports difficulty transferring for several weeks due to decreased ability to bear weight on right leg ( in SNF she was transferred to wheelchair for ambulation). Denies any urine/bowel incontinence. She does not recall any fevers. Denies any localizing symptoms other than mild cough. Reports adherence to all her medications, states her last weight was in the 170s prior to discharge from the SNF. No pain in her right wrist [facility] for [facility] debridement of presumed right wrist abscess as potential etiology for recurrent fevers. Evaluated at bedside on 12/14/18 with aspration of serosanguineous fluid with no concern for actual infection/abscess. Found to have a chronic longstanding nodule related to her RA  This a.m. continues to report right leg feeling much better.  Denies any chest pain, shortness of breath, fevers or chills.  Objective: Vitals:   12/19/18 1502 12/19/18 1506  BP: (!) 165/78 (!) 148/72  Pulse:  80  Resp:    Temp:  97.9 F (36.6 C)  SpO2:  98%    Intake/Output Summary (Last 24 hours) at 12/19/2018 1733 Last data filed at 12/18/2018 2242 Gross per 24 hour  Intake -  Output 750 ml  Net -750 ml   Filed Weights   12/13/18 1525 12/17/18 0500 12/18/18 0356  Weight: 78 kg 85.9 kg 84.3 kg    Exam:   General:  Lying in bed in no distress  Cardiovascular: RRR, no peripheral edema  Respiratory: normal respiratory effort on room air, no crackles or wheezing  Abdomen: soft, obese abdomen  Musculoskeletal: normal ROM  Skin right wrist with palpable nodule, nontender with no surrounding erythema or fluctuance  Neurologic decreased  strength in right leg but able to lift against resistance, sensation intact  Data Reviewed: Basic Metabolic Panel: Recent Labs  Lab 12/13/18 0845 12/14/18 0613 12/16/18 0159 12/17/18 0712 12/18/18 0428  NA 134* 134* 139 138  --   K 3.5 3.7 3.9 3.9  --   CL 102 104 104 106  --   CO2 _0 --   GLUCOSE 109* 178* 142* 167*  --   BUN _1 --   CREATININE 1.15* 1.00 1.22* 1.12* 1.13*  CALCIUM 8.1* 7.9* 8.2* 8.1*  --    Liver Function Tests: Recent Labs  Lab 12/13/18 0845 12/14/18 0613  AST 16 19  ALT 9 11  ALKPHOS 36* 39  BILITOT 0.5 0.3  PROT 5.5* 5.5*  ALBUMIN 2.6* 2.4*   No results for input(s): LIPASE, AMYLASE in the last 168 hours. No results for input(s): AMMONIA in the last 168 hours. CBC: Recent Labs  Lab 12/13/18 0845 12/14/18 9163 12/15/18 0203 12/16/18 0159 12/17/18 0712  WBC 5.3 4.9 6.7 6.5 6.8  HGB 8.5* 8.3* 7.9* 7.8* 8.4*  HCT 27.5* 26.0* 24.2* 24.0* 26.7*  MCV 97.5 96.3 91.7 93.0 93.0  PLT 239 229 249 268 299   Cardiac Enzymes: No results for input(s): CKTOTAL, CKMB, CKMBINDEX, TROPONINI in the last 168 hours. BNP (last 3 results) Recent Labs    05/28/18 1614  06/22/18 2153 07/23/18 2100  BNP 2,077.6* 1,146.7* 138.3*    ProBNP (last 3 results) No results for input(s): PROBNP in the last 8760 hours.  CBG: Recent Labs  Lab 12/14/18 1714  GLUCAP 149*    Recent Results (from the past 240 hour(s))  Blood culture (routine x 2)     Status: None   Collection Time: 12/13/18  8:43 AM  Result Value Ref Range Status   Specimen Description   Final    BLOOD RIGHT ARM Performed at [facility] [address], [place], [place] [address]    Special Requests   Final    BOTTLES DRAWN AEROBIC AND ANAEROBIC Blood Culture adequate volume Performed at [facility] [address], [place], [place] [address]    Culture   Final    NO GROWTH 5 DAYS Performed at [facility], [place] [address], [place], [place] [address]    Report Status 12/18/2018 FINAL  Final  Blood culture (routine x 2)     Status: None   Collection Time: 12/13/18  8:45 AM  Result Value Ref Range Status   Specimen Description   Final    BLOOD LEFT HAND Performed at [facility] [address] [place], [place] [address]    Special Requests   Final    BOTTLES DRAWN AEROBIC AND ANAEROBIC BCAV Performed at [facility], [place] [address] [place], [place] [address]    Culture   Final    NO GROWTH 5 DAYS Performed at [facility], [place] [address], [place], [place] [address]    Report Status 12/18/2018 FINAL  Final  Urine culture     Status: None   Collection Time: 12/13/18 11:11 AM  Result Value Ref Range Status   Specimen Description   Final    URINE, RANDOM Performed at [facility] [address] [place], [place] [address]    Special Requests   Final    NONE Performed at [facility], [place] [address] [place], [place] [address]    Culture   Final    NO GROWTH Performed at [facility], [place] [address], [place], [place] [address]    Report Status 12/14/2018 FINAL  Final  Surgical pcr screen     Status: None   Collection Time: 12/14/18  1:40 PM  Result Value Ref Range Status   MRSA, PCR NEGATIVE NEGATIVE Final   Staphylococcus aureus NEGATIVE NEGATIVE Final    Comment: (NOTE) The Xpert SA Assay (FDA approved for NASAL specimens in patients 72 years of age and older), is one component of a comprehensive surveillance program. It is not intended to diagnose infection nor to guide or monitor treatment. Performed at [facility], [place] [address] [place],  [address]      Studies: No results found.  Scheduled Meds: . allopurinol  100 mg Oral Daily  . carvedilol  25 mg Oral BID WC  . cholecalciferol  1,000 Units Oral Daily  . docusate sodium  100 mg Oral BID  . feeding supplement  (ENSURE ENLIVE)  237 mL Oral QPM  . furosemide  20 mg Oral Daily  . gabapentin  400 mg Oral TID  . hydrALAZINE  100 mg Oral TID  . HYDROcodone-acetaminophen  1 tablet Oral Q6H  . hydroxychloroquine  200 mg Oral BID  . isosorbide mononitrate  30 mg Oral QHS  . leflunomide  20 mg Oral  Daily  . loratadine  10 mg Oral Daily  . Melatonin  1 tablet Oral QHS  . olopatadine  1 drop Both Eyes BID  . polyvinyl alcohol  1 drop Both Eyes Daily  . predniSONE  40 mg Oral Q breakfast  . rivaroxaban  20 mg Oral Q supper  . sacubitril-valsartan  1 tablet Oral Q1200  . senna  1 tablet Oral BID  . vitamin C  500 mg Oral Daily   Continuous Infusions:   Active Problems:   Type 2 diabetes mellitus with hyperlipidemia ([place]), patient declines statin   Hypertension associated with diabetes ([place])   CAD (coronary artery disease)   Rheumatoid arthritis involving multiple sites [facility])   CKD stage 3 due to type 2 diabetes mellitus ([place])   DM (diabetes mellitus), type 2 with renal complications ([facility])   Chronic combined systolic and diastolic CHF (congestive heart failure) ([facility])   Benign hypertensive heart and kidney disease with CHF and stage 3 chronic kidney disease ([place])   Deep vein thrombosis (DVT) of distal vein of right lower extremity ([place])   Pulmonary embolism ([place]), 07/2018, on Xarelto with goal to DC after 6 months if more active   Refusal of statin medication by patient   Rheumatoid arthritis involving both knees ([place])   Spinal stenosis, lumbar   Fever   Abscess of right upper extremity      Desiree Hane  [facility]

## 2018-12-19 NOTE — Consult Note (Signed)
   [facility] CM Inpatient Consult   12/19/2018  KOLLEEN DURRETTE July 17, 1930 [id number]     Patient screened for potential [facility] Care Management services due to unplanned readmission risk score of 35% (extreme) and multiple hospitalizations.  Chart reviewed and spoke with inpatient LCSW and inpatient RNCM. Will engage for [facility] Care Management services if disposition plans change.   Raiford Noble, MSN-Ed, RN,BSN [facility] Liaison [phone]

## 2018-12-20 ENCOUNTER — Other Ambulatory Visit: Payer: Self-pay | Admitting: Internal Medicine

## 2018-12-20 DIAGNOSIS — I5042 Chronic combined systolic (congestive) and diastolic (congestive) heart failure: Secondary | ICD-10-CM | POA: Diagnosis not present

## 2018-12-20 DIAGNOSIS — E1122 Type 2 diabetes mellitus with diabetic chronic kidney disease: Secondary | ICD-10-CM | POA: Diagnosis not present

## 2018-12-20 DIAGNOSIS — M2550 Pain in unspecified joint: Secondary | ICD-10-CM | POA: Diagnosis not present

## 2018-12-20 DIAGNOSIS — M48062 Spinal stenosis, lumbar region with neurogenic claudication: Secondary | ICD-10-CM | POA: Diagnosis not present

## 2018-12-20 DIAGNOSIS — I2699 Other pulmonary embolism without acute cor pulmonale: Secondary | ICD-10-CM | POA: Diagnosis not present

## 2018-12-20 DIAGNOSIS — M109 Gout, unspecified: Secondary | ICD-10-CM | POA: Diagnosis not present

## 2018-12-20 DIAGNOSIS — I152 Hypertension secondary to endocrine disorders: Secondary | ICD-10-CM | POA: Diagnosis not present

## 2018-12-20 DIAGNOSIS — M47816 Spondylosis without myelopathy or radiculopathy, lumbar region: Secondary | ICD-10-CM | POA: Diagnosis not present

## 2018-12-20 DIAGNOSIS — M0689 Other specified rheumatoid arthritis, multiple sites: Secondary | ICD-10-CM | POA: Diagnosis not present

## 2018-12-20 DIAGNOSIS — I825Z1 Chronic embolism and thrombosis of unspecified deep veins of right distal lower extremity: Secondary | ICD-10-CM | POA: Diagnosis not present

## 2018-12-20 DIAGNOSIS — I429 Cardiomyopathy, unspecified: Secondary | ICD-10-CM | POA: Diagnosis not present

## 2018-12-20 DIAGNOSIS — M0579 Rheumatoid arthritis with rheumatoid factor of multiple sites without organ or systems involvement: Secondary | ICD-10-CM | POA: Diagnosis not present

## 2018-12-20 DIAGNOSIS — M069 Rheumatoid arthritis, unspecified: Secondary | ICD-10-CM | POA: Diagnosis not present

## 2018-12-20 DIAGNOSIS — R5081 Fever presenting with conditions classified elsewhere: Secondary | ICD-10-CM | POA: Diagnosis not present

## 2018-12-20 DIAGNOSIS — R2681 Unsteadiness on feet: Secondary | ICD-10-CM | POA: Diagnosis not present

## 2018-12-20 DIAGNOSIS — M48061 Spinal stenosis, lumbar region without neurogenic claudication: Secondary | ICD-10-CM

## 2018-12-20 DIAGNOSIS — Z7401 Bed confinement status: Secondary | ICD-10-CM | POA: Diagnosis not present

## 2018-12-20 DIAGNOSIS — E876 Hypokalemia: Secondary | ICD-10-CM | POA: Diagnosis not present

## 2018-12-20 DIAGNOSIS — M62838 Other muscle spasm: Secondary | ICD-10-CM | POA: Diagnosis not present

## 2018-12-20 DIAGNOSIS — I2510 Atherosclerotic heart disease of native coronary artery without angina pectoris: Secondary | ICD-10-CM | POA: Diagnosis not present

## 2018-12-20 DIAGNOSIS — I160 Hypertensive urgency: Secondary | ICD-10-CM | POA: Diagnosis not present

## 2018-12-20 DIAGNOSIS — R29898 Other symptoms and signs involving the musculoskeletal system: Secondary | ICD-10-CM | POA: Diagnosis not present

## 2018-12-20 DIAGNOSIS — D631 Anemia in chronic kidney disease: Secondary | ICD-10-CM | POA: Diagnosis not present

## 2018-12-20 DIAGNOSIS — M4726 Other spondylosis with radiculopathy, lumbar region: Secondary | ICD-10-CM | POA: Diagnosis not present

## 2018-12-20 DIAGNOSIS — I130 Hypertensive heart and chronic kidney disease with heart failure and stage 1 through stage 4 chronic kidney disease, or unspecified chronic kidney disease: Secondary | ICD-10-CM | POA: Diagnosis not present

## 2018-12-20 DIAGNOSIS — M5431 Sciatica, right side: Secondary | ICD-10-CM | POA: Diagnosis not present

## 2018-12-20 DIAGNOSIS — R569 Unspecified convulsions: Secondary | ICD-10-CM | POA: Diagnosis not present

## 2018-12-20 DIAGNOSIS — N183 Chronic kidney disease, stage 3 (moderate): Secondary | ICD-10-CM | POA: Diagnosis not present

## 2018-12-20 DIAGNOSIS — I69398 Other sequelae of cerebral infarction: Secondary | ICD-10-CM | POA: Diagnosis not present

## 2018-12-20 DIAGNOSIS — M6281 Muscle weakness (generalized): Secondary | ICD-10-CM | POA: Diagnosis not present

## 2018-12-20 LAB — CREATININE, SERUM
Creatinine, Ser: 1.1 mg/dL — ABNORMAL HIGH (ref 0.44–1.00)
GFR calc Af Amer: 53 mL/min — ABNORMAL LOW (ref >60)
GFR calc non Af Amer: 45 mL/min — ABNORMAL LOW (ref >60)

## 2018-12-20 LAB — GLUCOSE, CAPILLARY
Glucose-Capillary: 88 mg/dL (ref 70–99)
Glucose-Capillary: 177 mg/dL — ABNORMAL HIGH (ref 70–99)

## 2018-12-20 MED ORDER — HYDROCODONE-ACETAMINOPHEN 5-325 MG PO TABS: 1.0000 | ORAL_TABLET | Freq: Four times a day (QID) | ORAL | 0 refills | Status: DC

## 2018-12-20 MED ORDER — GABAPENTIN 400 MG PO CAPS: 400.0000 mg | ORAL_CAPSULE | Freq: Three times a day (TID) | ORAL | 0 refills | Status: DC

## 2018-12-20 MED ORDER — HYDROXYCHLOROQUINE SULFATE 200 MG PO TABS: 200.0000 mg | ORAL_TABLET | Freq: Two times a day (BID) | ORAL | 0 refills | Status: AC

## 2018-12-20 MED ORDER — RIVAROXABAN 20 MG PO TABS: 20.0000 mg | ORAL_TABLET | Freq: Every day | ORAL | 0 refills | Status: DC

## 2018-12-20 MED ORDER — HYDRALAZINE HCL 100 MG PO TABS: 100.0000 mg | ORAL_TABLET | Freq: Three times a day (TID) | ORAL | 0 refills | Status: DC

## 2018-12-20 MED ORDER — ALLOPURINOL 100 MG PO TABS: 100.0000 mg | ORAL_TABLET | Freq: Every day | ORAL | 0 refills | Status: DC

## 2018-12-20 MED ORDER — PREDNISONE 10 MG PO TABS: ORAL_TABLET | ORAL | 0 refills | Status: DC

## 2018-12-20 MED ORDER — DOCUSATE SODIUM 100 MG PO CAPS: 100.0000 mg | ORAL_CAPSULE | Freq: Every day | ORAL | 0 refills | Status: DC | PRN

## 2018-12-20 MED ORDER — LEFLUNOMIDE 20 MG PO TABS: 20.0000 mg | ORAL_TABLET | Freq: Every day | ORAL | 0 refills | Status: DC

## 2018-12-20 MED ORDER — OLOPATADINE HCL 0.2 % OP SOLN: 1.0000 [drp] | Freq: Every day | OPHTHALMIC | 0 refills | Status: DC

## 2018-12-20 MED ORDER — VITAMIN C 500 MG PO TABS: 500.0000 mg | ORAL_TABLET | Freq: Every day | ORAL | 0 refills | Status: DC

## 2018-12-20 MED ORDER — ENSURE PO LIQD: 237.0000 mL | Freq: Every evening | ORAL | 12 refills | Status: DC

## 2018-12-20 MED ORDER — D3-1000 25 MCG (1000 UT) PO CAPS: 1000.0000 [IU] | ORAL_CAPSULE | Freq: Every day | ORAL | 0 refills | Status: DC

## 2018-12-20 MED ORDER — ALBUTEROL SULFATE HFA 108 (90 BASE) MCG/ACT IN AERS: 1.0000 | INHALATION_SPRAY | Freq: Four times a day (QID) | RESPIRATORY_TRACT | 0 refills | Status: DC | PRN

## 2018-12-20 MED ORDER — MELATONIN 3 MG PO TABS: 1.0000 | ORAL_TABLET | Freq: Every day | ORAL | 0 refills | Status: DC

## 2018-12-20 MED ORDER — TRIAMCINOLONE ACETONIDE 0.1 % EX CREA: 1.0000 "application " | TOPICAL_CREAM | Freq: Two times a day (BID) | CUTANEOUS | 0 refills | Status: DC | PRN

## 2018-12-20 MED ORDER — FUROSEMIDE 20 MG PO TABS: 20.0000 mg | ORAL_TABLET | Freq: Every day | ORAL | 0 refills | Status: DC

## 2018-12-20 MED ORDER — LORATADINE 10 MG PO TABS: 10.0000 mg | ORAL_TABLET | Freq: Every day | ORAL | Status: DC

## 2018-12-20 MED ORDER — CARVEDILOL 25 MG PO TABS: 25.0000 mg | ORAL_TABLET | Freq: Two times a day (BID) | ORAL | 0 refills | Status: DC

## 2018-12-20 MED ORDER — HYPROMELLOSE (GONIOSCOPIC) 2.5 % OP SOLN: 1.0000 [drp] | Freq: Every day | OPHTHALMIC | 0 refills | Status: DC

## 2018-12-20 MED ORDER — SACUBITRIL-VALSARTAN 49-51 MG PO TABS: 1.0000 | ORAL_TABLET | Freq: Every day | ORAL | 0 refills | Status: DC

## 2018-12-20 MED ORDER — ISOSORBIDE MONONITRATE ER 30 MG PO TB24: 30.0000 mg | ORAL_TABLET | Freq: Every day | ORAL | 0 refills | Status: DC

## 2018-12-20 MED ORDER — DICLOFENAC SODIUM 1 % TD GEL: 2.0000 g | Freq: Every day | TRANSDERMAL | 0 refills | Status: DC | PRN

## 2018-12-20 NOTE — Discharge Summary (Signed)
Discharge Summary  [name] [name] WUJ:[id number] DOB: 1930-01-03  PCP: [name] Rima, DO  Admit date: 12/11/2018 Discharge date: 12/20/2018   Time spent: < 25 minutes  Admitted From: SNF Disposition:  SNF  Recommendations for Outpatient Follow-up:  1. Follow up with PCP in 1week 2. Continue prednisone 20 mg for the next 3 days (post discharge) and quick taper back to her home regimen of 10 mg(starting 2/15).   Discharge Diagnoses:  Active Hospital Problems   Diagnosis Date Noted  . Abscess of right upper extremity 12/14/2018  . Fever 12/13/2018  . Spinal stenosis, lumbar 11/19/2018  . Rheumatoid arthritis involving both knees (HCC) 10/27/2018  . Refusal of statin medication by patient 09/04/2018  . Deep vein thrombosis (DVT) of distal vein of right lower extremity (HCC) 07/30/2018  . Pulmonary embolism (HCC), 07/2018, on Xarelto with goal to DC after 6 months if more active 07/10/2018  . Benign hypertensive heart and kidney disease with CHF and stage 3 chronic kidney disease (HCC) 05/28/2018  . Chronic combined systolic and diastolic CHF (congestive heart failure) (HCC) 09/12/2015  . CKD stage 3 due to type 2 diabetes mellitus (HCC) 06/05/2014  . DM (diabetes mellitus), type 2 with renal complications (HCC) 06/05/2014    Class: Chronic  . Rheumatoid arthritis involving multiple sites (HCC) 12/19/2013    Class: Chronic  . CAD (coronary artery disease) 10/18/2013  . Hypertension associated with diabetes (HCC) 08/08/2013    Class: Chronic  . Type 2 diabetes mellitus with hyperlipidemia [facility]), patient declines statin 04/27/2007    Resolved Hospital Problems  No resolved problems to display.    Discharge Condition: STABLE   CODE STATUS:FULL  History of present illness:  [name] [name] is a 85 y.o. year old female with medical history significant for combined systolic/diastolic CHF, CAD, PE on xarelto, diabetes,  RA on leflunomide, Plaquenil, and prednisone, prior stroke,  HTN, gout, severe lumbar spinal stenosis who presented on 12/11/2018 with reports of weakness to right leg and difficulty ambulating/transferring per family after recent discharge from skilled nursing facility and was found to have decreased ability to bear weight on right leg and reported fever. She was discharged from her SNF on 2/1 and reports difficulty transferring for several weeks due to decreased ability to bear weight on right leg ( in SNF she was transferred to wheelchair for ambulation). Denies any urine/bowel incontinence. She does not recall any fevers. Denies any localizing symptoms other than mild cough. Reports adherence to all her medications, states her last weight was in the 170s prior to discharge from the SNF. No pain in her right wrist  She was initially admitted at [facility] long on 2/4 due to persistent fevers of unknown etiology with concern for potential infectious process related to right wrist nodule believed to be a wrist abscess.  Patient was transferred [facility] to [facility] for surgical debridement of presumed right wrist abscess as potential etiology for recurrent fevers. Evaluated at bedside on 12/14/18 with aspration of serosanguineous fluid with no concern for actual infection/abscess. Found to have a chronic longstanding nodule related to her RARemaining hospital course addressed in problem based format below:   Hospital Course:   Persistent fever, presumed secondary to RA flare, resolved Initially when admitted at [facility] long concern for wrist abscess based off MRI imaging of right wrist nodule.  Hand surgery evaluated at bedside on 2/5 and aspiration of serosanguineous fluid with no concern for actual infection/abscess and more consistent with inflammatory arthritis patient was monitored off of  IV vancomycin and cefepime and initiation of prednisone and resuming home RA DMARD medications with resolution of fevers and improvement in clinical status.  Enhancing fluid collection of right  wrist secondary to RA.  Initially presumed to be abscess based off MRI imaging as mentioned above.  Dr. Gwynneth [name] with surgery evaluated and aspiration of fluid showed?  Material consistent with inflammatory arthropathy.  Patient reported having this nodule for well over a year and related to her known artery disease.  Clinically does not appear to be an abscess, is not tender not erythematous not fluctuant.  Rheumatoid arthritis, stable patient has right wrist nodule related to RA.  Persistent pain in right knee and hip has improved greatly with initiation of high-dose prednisone (up from her previous baseline of 10).  On discharge she will continue 20 mg for the next 3 days and quick taper back to her home regimen of 10 mg starting 2/15.  Continue her Plaquenil every flutamide on discharge  Severe lumbar spinal stenosis of L4-L5, L5-S1.  Has had no red flag symptoms (no claudication, no bowel/urinary incontinence).  Has had no cord compression symptoms.  Repeat MRI here shows stable stenosis.  Cardiology evaluated while in hospital and cleared patient for surgery while recommending continuing IV heparin for bridge for anticoagulation for known DVT while discontinuing warfarin when surgery is pursued.  Right extremity weakness, subacute, improving.  Initial concern that this may be due to symptomatic lumbar stenosis however MRI showed stability there.  With initiation of prednisone therapy for RA flare noticed improvement in strength though still requiring quite significant rehab for which PT recommended skilled nursing facility  Chronic combined systolic/diastolic heart failure, stable.  Remains euvolemic on exam.  Can continue Lasix 20 mg daily at home [place], Coreg.  Mentioned above cardiology gave preop clearance for any spine surgery in the future.  Hypertension, stable.  Continue home hydralazine and Coreg.  History of right lower lobe PE/right DVT (diagnosed 07/2018).  Maintain normal oxygen  saturation, with no shortness of breath.  Can continue home Xarelto (need to continue for least 6 months before considering discontinuation).  When patient does have outpatient procedure for spinal stenosis repair cardiology recommends transitioning to IV heparin at that time.  Gout, stable.  Continue home allopurinol.  Type 2 diabetes not on insulin.  Can resume home oral hypoglycemics.  CKD, stage III.  Creatinine remained stable at baseline.  Continue to avoid nephrotoxins.  Anemia of chronic disease.  Likely consider RA.  Patient had no active bleeding here remained asymptomatic.  CAD, stable.  Had no chest pain throughout hospital stay.  Continue Imdur and carvedilol.  Pain/psych.  Continue home gabapentin.   Consultations:  Cardiology, Hand Surgery  Procedures/Studies:  Bedside aspiration of right wrist nodule- consistent with crystal like material, sero sanguinous fluid  Discharge Exam: BP (!) 143/62 (BP Location: Right Arm)   Pulse 68   Temp 98.1 F (36.7 C) (Oral)   Resp 18   Ht  (1.626 m)   Wt 84.3 kg   SpO2 99%   BMI 31.90 kg/m    General:  Lying in bed in no distress  Cardiovascular: RRR, no peripheral edema  Respiratory: normal respiratory effort on room air, no crackles or wheezing  Abdomen: soft, obese abdomen  Musculoskeletal: normal ROM  Skin right wrist with palpable nodule, nontender with no surrounding erythema or fluctuance  Neurologic decreased strength in right leg but able to lift against resistance, sensation intact   Discharge Instructions You  were cared for by a hospitalist during your hospital stay. If you have any questions about your discharge medications or the care you received while you were in the hospital after you are discharged, you can call the unit and asked to speak with the hospitalist on call if the hospitalist that took care of you is not available. Once you are discharged, your primary care physician will handle any  further medical issues. Please note that NO REFILLS for any discharge medications will be authorized once you are discharged, as it is imperative that you return to your primary care physician (or establish a relationship with a primary care physician if you do not have one) for your aftercare needs so that they can reassess your need for medications and monitor your lab values.  Discharge Instructions    Diet - low sodium heart healthy   Complete by:  As directed    Increase activity slowly   Complete by:  As directed      Allergies as of 12/20/2018      Reactions   Clonidine Derivatives Swelling   [name]   Fish Allergy Diarrhea, Swelling, Other (See Comments)   Turns skin "black," but can tolerate white fish Salmon- Diarrhea   Shellfish Allergy Hives   Doxycycline Rash   Indomethacin Other (See Comments)   Reaction not recalled by the patient   Lyrica [pregabalin] Other (See Comments)   Hallucinations   Methyldopa Other (See Comments)   Aldomet (for hypertension): Reaction not recalled by the patient   Morphine And Related Other (See Comments)   Family reports it drops her bp that she needs iv fluids   Orange Fruit [citrus] Other (See Comments)   Indigestion/heartburn   Strawberry (diagnostic) Itching   Cetirizine Hcl Itching, Rash   Codeine Itching   Levaquin [levofloxacin In D5w] Rash   Tomato Rash      Medication List    TAKE these medications   albuterol 108 (90 Base) MCG/ACT inhaler Commonly known as:  PROVENTIL HFA;VENTOLIN HFA Inhale 1-2 puffs into the lungs every 6 (six) hours as needed for wheezing or shortness of breath.   allopurinol 100 MG tablet Commonly known as:  ZYLOPRIM Take 1 tablet (100 mg total) by mouth at bedtime.   carvedilol 25 MG tablet Commonly known as:  COREG Take 1 tablet (25 mg total) by mouth 2 (two) times daily with a meal.   D3-1000 25 MCG (1000 UT)  capsule Generic drug:  Cholecalciferol Take 1 capsule (1,000 Units total) by mouth daily.   diclofenac sodium 1 % Gel Commonly known as:  VOLTAREN Apply 2 g topically daily as needed (pain).   docusate sodium 100 MG capsule Commonly known as:  COLACE Take 1 capsule (100 mg total) by mouth daily as needed for mild constipation.   ENSURE Take 237 mLs by mouth every evening.   furosemide 20 MG tablet Commonly known as:  LASIX Take 1 tablet (20 mg total) by mouth daily.   gabapentin 400 MG capsule Commonly known as:  NEURONTIN Take 1 capsule (400 mg total) by mouth 3 (three) times daily.   hydrALAZINE 100 MG tablet Commonly known as:  APRESOLINE Take 1 tablet (100 mg total) by mouth 3 (three) times daily.   HYDROcodone-acetaminophen 5-325 MG tablet Commonly known as:  NORCO/VICODIN Take 1 tablet by mouth every 6 (six) hours.   hydroxychloroquine 200 MG tablet Commonly known as:  PLAQUENIL Take 1 tablet (200  mg total) by mouth 2 (two) times daily.   hydroxypropyl methylcellulose / hypromellose 2.5 % ophthalmic solution Commonly known as:  ISOPTO TEARS / GONIOVISC Place 1 drop into both eyes daily.   isosorbide mononitrate 30 MG 24 hr tablet Commonly known as:  IMDUR Take 1 tablet (30 mg total) by mouth at bedtime.   leflunomide 20 MG tablet Commonly known as:  ARAVA Take 1 tablet (20 mg total) by mouth daily.   loratadine 10 MG tablet Commonly known as:  CLARITIN Take 1 tablet (10 mg total) by mouth daily. Start taking on:  December 21, 2018   Melatonin 3 MG Tabs Take 1 tablet (3 mg total) by mouth at bedtime.   Olopatadine HCl 0.2 % Soln Place 1 drop into both eyes daily.   predniSONE 10 MG tablet Commonly known as:  DELTASONE 2/12 start 20 mg x 3 days then resume 10 mg on 2/15 What changed:    how much to take  how to take this  when to take this  additional instructions   rivaroxaban 20 MG Tabs tablet Commonly known as:  XARELTO Take 1 tablet (20  mg total) by mouth daily with supper.   sacubitril-valsartan 49-51 MG Commonly known as:  ENTRESTO Take 1 tablet by mouth daily at 12 noon.   triamcinolone cream 0.1 % Commonly known as:  KENALOG Apply 1 application topically 2 (two) times daily as needed (for itching- to affected sites).   vitamin C 500 MG tablet Commonly known as:  ASCORBIC ACID Take 1 tablet (500 mg total) by mouth daily.      Allergies  Allergen Reactions  . Clonidine Derivatives Swelling    [name]  . Fish Allergy Diarrhea, Swelling and Other (See Comments)    Turns skin "black," but can tolerate white fish Salmon- Diarrhea  . Shellfish Allergy Hives  . Doxycycline Rash  . Indomethacin Other (See Comments)    Reaction not recalled by the patient  . Lyrica [Pregabalin] Other (See Comments)    Hallucinations   . Methyldopa Other (See Comments)    Aldomet (for hypertension): Reaction not recalled by the patient  . Morphine And Related Other (See Comments)    Family reports it drops her bp that she needs iv fluids  . Orange Fruit [Citrus] Other (See Comments)    Indigestion/heartburn  . Strawberry (Diagnostic) Itching  . Cetirizine Hcl Itching and Rash  . Codeine Itching  . Levaquin [Levofloxacin In D5w] Rash  . Tomato Rash   Follow-up Information    [name] RimaWallace, Erica, DO.   Specialty:  Family Medicine Contact information: [address] [place] [id number] [phone]            The results of significant diagnostics from this hospitalization (including imaging, microbiology, ancillary and laboratory) are listed below for reference.    Significant Diagnostic Studies: Dg Chest 2 View  Result Date: 12/13/2018 CLINICAL DATA:  Fever, hypoxia. EXAM: CHEST - 2 VIEW COMPARISON:  Radiograph of November 15, 2018 FINDINGS: Stable cardiomegaly. No pneumothorax or pleural effusion is noted. Both lungs are clear. The  visualized skeletal structures are unremarkable. IMPRESSION: No active cardiopulmonary disease. Electronically Signed   By: Lupita RaiderJames  Green Jr, [name]   On: 12/13/2018 09:51   Mr Lumbar Spine Wo Contrast  Result Date: 12/16/2018 CLINICAL DATA:  Back pain.  Inability to bear weight on legs. EXAM: MRI LUMBAR SPINE WITHOUT CONTRAST TECHNIQUE: Multiplanar, multisequence MR imaging of the  lumbar spine was performed. No intravenous contrast was administered. COMPARISON:  Two recent examinations: MRI 11/15/2018 and 10/21/2018 FINDINGS: Segmentation: The same numbering system is use as the prior MRIs with partial lumbarization of S1 and a near full disc space at S1-2. Alignment: Stable advanced degenerative lumbar spondylosis with multilevel disc disease and facet disease. Degenerative anterolisthesis of L4 and L5 is stable along with mild degenerative retrolisthesis of L2. Vertebrae: Stable compression deformity of L1 with L1 hemangioma. No acute lumbar spine fracture. No worrisome bone lesions. Conus medullaris and cauda equina: Conus extends to the L1-2 level. Conus and cauda equina appear normal. Paraspinal and other soft tissues: No significant findings. Stable right renal cyst. Disc levels: L1-2: Mild facet disease but no disc protrusions, spinal or foraminal stenosis. No change since recent MRIs. L2-3: Advanced degenerate disc disease and moderate facet disease. There is a bulging uncovered disc and osteophytic ridging with mild flattening of the ventral thecal sac and mild bilateral lateral recess encroachment. There is also mild foraminal encroachment, left greater than right. This appears stable. L3-4: Stable bulging degenerated annulus, osteophytic ridging, short pedicles and facet disease contributing to moderate spinal and bilateral lateral recess stenosis. No significant foraminal stenosis. L4-5: Degenerative anterolisthesis of L4 with a bulging uncovered disc. This in combination with short pedicles and severe  facet disease contributes to moderately severe spinal and bilateral lateral recess stenosis and moderate bilateral foraminal stenosis. These findings are stable. L5-S1: Degenerative anterolisthesis of L5 with a bulging uncovered disc. There is also severe facet disease and short pedicles contributing to severe spinal and bilateral lateral recess stenosis and moderate bilateral foraminal stenosis. These findings appear stable. S1-S2: Facet disease but no disc protrusions or foraminal stenosis. IMPRESSION: 1. Stable advanced degenerative lumbar spondylosis with multilevel disc disease and facet disease. 2. Stable compression deformity of L1.  No acute bony abnormalities. 3. Stable multifactorial moderate spinal and bilateral lateral recess stenosis at L3-4. 4. Stable multifactorial moderately severe spinal and bilateral lateral recess stenosis at L4-5. There is also moderate bilateral foraminal stenosis. 5. Stable multifactorial severe spinal and bilateral lateral recess stenosis at L5-S1 and moderate bilateral foraminal stenosis. Electronically Signed   By: Rudie Meyer [name]   On: 12/16/2018 11:01   Mr Hand Right W Wo Contrast  Result Date: 12/13/2018 CLINICAL DATA:  Hand swelling, cellulitis EXAM: MRI OF THE RIGHT HAND WITHOUT AND WITH CONTRAST TECHNIQUE: Multiplanar, multisequence MR imaging of the right hand was performed before and after the administration of intravenous contrast. CONTRAST:  7.5 mL Gadavist COMPARISON:  None. FINDINGS: Bones/Joint/Cartilage No acute fracture or dislocation. Scapholunate widening consistent with a scapholunate ligament tear with proximal migration of the capitate consistent with a SLAC wrist. Severe carpal synovitis. Multiple erosions involving the carpal bones. Carpal crowding. Erosion of the distal radius. Erosion of the first metacarpal head. Small joint effusion and synovitis of the second, third, fourth and fifth MCP joints. Mild osteoarthritis of the first CMC joint.  Ligaments Collateral ligaments are intact. Muscles and Tendons Muscles are normal. No muscle atrophy. Flexor and extensor compartment tendons are intact. Soft tissue Soft tissue edema with enhancement along the radial aspect of the distal forearm and wrist. Complex peripherally enhancing fluid collection measuring approximately 1.8 x 4.2 x 6.9 cm along the dorsal radial aspect of the forearm and wrist most concerning for an abscess. No soft tissue mass. IMPRESSION: 1. Cellulitis of the distal radial aspect of forearm and wrist. 1.8 x 4.2 x 6.9 cm peripherally enhancing fluid collection along  the dorsal radial aspect of the distal forearm and wrist most concerning for an abscess. The fluid collection extends into the distal forearm which is outside of the field of view limiting full characterization of the extent of fluid collection. 2. Erosive changes of the carpus with carpal crowding and diffuse carpal synovitis as can be seen with an inflammatory arthropathy such as rheumatoid arthritis. 3. Small joint effusion and synovitis of the second, third, fourth and fifth MCP joints. 4. SLAC wrist. Electronically Signed   By: Elige Ko   On: 12/13/2018 20:48    Microbiology: Recent Results (from the past 240 hour(s))  Blood culture (routine x 2)     Status: None   Collection Time: 12/13/18  8:43 AM  Result Value Ref Range Status   Specimen Description   Final    BLOOD RIGHT ARM Performed at [facility], [address] [place], [place] [address]    Special Requests   Final    BOTTLES DRAWN AEROBIC AND ANAEROBIC Blood Culture adequate volume Performed at [facility], [address] [place], [place] [address]    Culture   Final    NO GROWTH 5 DAYS Performed at [facility], [address], [place], [place] [address]    Report Status 12/18/2018 FINAL  Final  Blood culture (routine x 2)     Status: None   Collection Time: 12/13/18  8:45 AM  Result Value  Ref Range Status   Specimen Description   Final    BLOOD LEFT HAND Performed at [facility], [address], [place], [place] [address]    Special Requests   Final    BOTTLES DRAWN AEROBIC AND ANAEROBIC BCAV Performed at [facility], [address], [place], [place] [address]    Culture   Final    NO GROWTH 5 DAYS Performed at [facility], [address], [place], [place] [address]    Report Status 12/18/2018 FINAL  Final  Urine culture     Status: None   Collection Time: 12/13/18 11:11 AM  Result Value Ref Range Status   Specimen Description   Final    URINE, RANDOM Performed at [facility], [address], [place], [place] [address]    Special Requests   Final    NONE Performed at [facility], [address] [place] [address] [place], [place] [address]    Culture   Final    NO GROWTH Performed at [facility], [address] [place], [place] [address]    Report Status 12/14/2018 FINAL  Final  Surgical pcr screen     Status: None   Collection Time: 12/14/18  1:40 PM  Result Value Ref Range Status   MRSA, PCR NEGATIVE NEGATIVE Final   Staphylococcus aureus NEGATIVE NEGATIVE Final    Comment: (NOTE) The Xpert SA Assay (FDA approved for NASAL specimens in patients 19 years of age and older), is one component of a comprehensive surveillance program. It is not intended to diagnose infection nor to guide or monitor treatment. Performed at [facility], [address] [place], [place] [address]      Labs: Basic Metabolic Panel: Recent Labs  Lab 12/14/18 [id number] 12/16/18 0159 12/17/18 0712 12/18/18 0428 12/20/18 0255  NA 134* 139 138  --   --   K 3.7 3.9 3.9  --   --   CL 104 104 106  --   --  CO2 22 24 25   --   --   GLUCOSE 178* 142* 167*  --   --   BUN 17 22 23   --   --   CREATININE 1.00 1.22* 1.12* 1.13* 1.10*  CALCIUM 7.9* 8.2* 8.1*  --   --    Liver Function  Tests: Recent Labs  Lab 12/14/18 0613  AST 19  ALT 11  ALKPHOS 39  BILITOT 0.3  PROT 5.5*  ALBUMIN 2.4*   No results for input(s): LIPASE, AMYLASE in the last 168 hours. No results for input(s): AMMONIA in the last 168 hours. CBC: Recent Labs  Lab 12/14/18 0613 12/15/18 0203 12/16/18 0159 12/17/18 0712  WBC 4.9 6.7 6.5 6.8  HGB 8.3* 7.9* 7.8* 8.4*  HCT 26.0* 24.2* 24.0* 26.7*  MCV 96.3 91.7 93.0 93.0  PLT 229 249 268 299   Cardiac Enzymes: No results for input(s): CKTOTAL, CKMB, CKMBINDEX, TROPONINI in the last 168 hours. BNP: BNP (last 3 results) Recent Labs    05/28/18 1614 06/22/18 2153 07/23/18 2100  BNP 2,077.6* 1,146.7* 138.3*    ProBNP (last 3 results) No results for input(s): PROBNP in the last 8760 hours.  CBG: Recent Labs  Lab 12/14/18 1714 12/20/18 1123  GLUCAP 149* 88       Signed:  Laverna PeaceShayla D Amere Iott, MD [facility] 12/20/2018, 2:14 PM

## 2018-12-20 NOTE — Clinical Social Work Placement (Signed)
   CLINICAL SOCIAL WORK PLACEMENT  NOTE  Date:  12/20/2018  Patient Details  Name: [name] [name] MRN: [id number] Date of Birth: Jan 20, 1930  Clinical Social Work is seeking post-discharge placement for this patient at the Skilled  Nursing Facility level of care (*CSW will initial, date and re-position this form in  chart as items are completed):      Patient/family provided with [facility] Clinical Social Work Department's list of facilities offering this level of care within the geographic area requested by the patient (or if unable, by the patient's family).  Yes   Patient/family informed of their freedom to choose among providers that offer the needed level of care, that participate in Medicare, Medicaid or managed care program needed by the patient, have an available bed and are willing to accept the patient.      Patient/family informed of [facility]'s ownership interest in [facility] and [facility], as well as of the fact that they are under no obligation to receive care at these facilities.  PASRR submitted to EDS on       PASRR number received on 12/20/18     Existing PASRR number confirmed on       FL2 transmitted to all facilities in geographic area requested by pt/family on 12/20/18     FL2 transmitted to all facilities within larger geographic area on       Patient informed that his/her managed care company has contracts with or will negotiate with certain facilities, including the following:        Yes   Patient/family informed of bed offers received.  Patient chooses bed at [facility]     Physician recommends and patient chooses bed at      Patient to be transferred to [facility] on 12/20/18.  Patient to be transferred to facility by [facility]     Patient family notified on 12/20/18 of transfer.  Name of family member notified:  [facility]     PHYSICIAN       Additional Comment:     _______________________________________________ Gildardo Griffes, LCSW 12/20/2018, 4:03 PM

## 2018-12-20 NOTE — Progress Notes (Signed)
Patient will DC to:[facility] Anticipated DC date: 12/20/2018 Family notified:Jeannette Transport SW:NIOE  Per MD patient ready for DC to [facility]. RN, patient, patient's family, and facility notified of DC. Discharge Summary sent to facility. RN given number for report [phone] . DC packet on chart. Ambulance transport requested for patient.  CSW signing off.  [place], [place] [phone]

## 2018-12-20 NOTE — [place] FL2 (Signed)
[place] MEDICAID FL2 LEVEL OF CARE SCREENING TOOL     IDENTIFICATION  Patient Name: [name] [name] Birthdate: May 22, 1930 Sex: female Admission Date (Current Location): 12/11/2018  [facility] and [place] Number:  Producer, television/film/video and Address:  [facility], [address], [place], [place] [address]      Provider Number: [id number]  Attending Physician Name and Address:  Laverna Peace, MD  Relative Name and Phone Number:  Lattie Corns [phone]    Current Level of Care: Hospital Recommended Level of Care: Skilled Nursing Facility Prior Approval Number:    Date Approved/Denied: 08/10/13 PASRR Number: [id number]  Discharge Plan: SNF    Current Diagnoses: Patient Active Problem List   Diagnosis Date Noted  . Abscess of right upper extremity 12/14/2018  . Fever 12/13/2018  . Hand swelling 12/02/2018  . Pain, wrist 12/02/2018  . Hand pain 12/02/2018  . Polyarthritis 12/02/2018  . Spinal stenosis, lumbar 11/19/2018  . Acute encephalopathy 11/19/2018  . History of CVA (cerebrovascular accident) 11/19/2018  . Acute gout 11/15/2018  . Low back pain 11/14/2018  . Chronic back pain 10/27/2018  . Arthralgia of both knees 10/27/2018  . Rheumatoid arthritis involving both knees (HCC) 10/27/2018  . Acute pain 10/21/2018  . Physical deconditioning 09/06/2018  . Hyperlipidemia associated with type 2 diabetes mellitus (HCC), refuses statin 09/06/2018  . Refusal of statin medication by patient 09/04/2018  . Deep vein thrombosis (DVT) of distal vein of right lower extremity (HCC) 07/30/2018  . Hypertensive heart disease with heart failure (HCC) 07/30/2018  . Pulmonary embolism (HCC), 07/2018, on Xarelto with goal to DC after 6 months if more active 07/10/2018  . DCM (dilated cardiomyopathy) (HCC)   . Benign hypertensive heart and kidney disease with CHF and stage 3 chronic kidney disease (HCC) 05/28/2018  . Osteoarthritis 05/06/2018  . Hypokalemia  02/02/2018  . Adhesive capsulitis of right shoulder 10/12/2017  . Idiopathic chronic gout of multiple sites with tophus 08/25/2017  . History of total knee arthroplasty, bilateral 02/18/2017  . History of rotator cuff surgery 02/18/2017  . High risk medications, long-term use 09/21/2016  . Chronic combined systolic and diastolic CHF (congestive heart failure) (HCC) 09/12/2015  . Bilateral edema of lower extremity 06/05/2014  . CKD stage 3 due to type 2 diabetes mellitus (HCC) 06/05/2014  . DM (diabetes mellitus), type 2 with renal complications (HCC) 06/05/2014    Class: Chronic  . Anemia of chronic disease 05/01/2014  . Rheumatoid arthritis involving multiple sites (HCC) 12/19/2013    Class: Chronic  . CAD (coronary artery disease) 10/18/2013  . Hypertension associated with diabetes (HCC) 08/08/2013    Class: Chronic  . Fibromyalgia 05/10/2013  . Type 2 diabetes mellitus with hyperlipidemia (HCC), patient declines statin 04/27/2007  . Allergic rhinitis 04/27/2007  . Diverticulosis 04/27/2007    Orientation RESPIRATION BLADDER Height & Weight     Self, Time, Situation, Place  Normal Incontinent, External catheter Weight: 84.3 kg Height:  5\' 4"  (162.6 cm)  BEHAVIORAL SYMPTOMS/MOOD NEUROLOGICAL BOWEL NUTRITION STATUS      Continent Diet(see discharge summary)  AMBULATORY STATUS COMMUNICATION OF NEEDS Skin   Extensive Assist Verbally Other (Comment)(right hand closed surgical incision)                       Personal Care Assistance Level of Assistance  Bathing, Feeding, Dressing, Total care Bathing Assistance: Maximum assistance Feeding assistance: Limited assistance Dressing Assistance: Maximum assistance Total Care Assistance: Maximum assistance  Functional Limitations Info  Sight, Hearing, Speech Sight Info: Adequate Hearing Info: Adequate Speech Info: Adequate    SPECIAL CARE FACTORS FREQUENCY  PT (By licensed PT), OT (By licensed OT)     PT Frequency: min 5x  weekly OT Frequency: min 5x weekly            Contractures Contractures Info: Not present    Additional Factors Info  Code Status, Allergies Code Status Info: full Allergies Info: Clonidine Derivatives, Fish allergy, shellfish allergy, doxycycline, indomethacin, lyrica (pregabalin), methyldopa, morphine and related, orange fruit (citrus), strawberry, cetirizine hcl, codeine, levaquin (levofloxacin ln D5w), tomato           Current Medications (12/20/2018):  This is the current hospital active medication list Current Facility-Administered Medications  Medication Dose Route Frequency Provider Last Rate Last Dose  . acetaminophen (TYLENOL) tablet 650 mg  650 mg Oral Q6H PRN Hongalgi, Maximino Greenland, MD       Or  . acetaminophen (TYLENOL) suppository 650 mg  650 mg Rectal Q6H PRN Hongalgi, Anand D, MD      . albuterol (PROVENTIL) (2.5 MG/3ML) 0.083% nebulizer solution 3 mL  3 mL Nebulization Q6H PRN Elease Etienne, MD   3 mL at 12/18/18 0415  . allopurinol (ZYLOPRIM) tablet 100 mg  100 mg Oral Daily Marcellus Scott D, MD   100 mg at 12/20/18 1057  . carvedilol (COREG) tablet 25 mg  25 mg Oral BID WC Hongalgi, Maximino Greenland, MD   25 mg at 12/20/18 1057  . cholecalciferol (VITAMIN D3) tablet 1,000 Units  1,000 Units Oral Daily Elease Etienne, MD   1,000 Units at 12/20/18 1058  . diclofenac sodium (VOLTAREN) 1 % transdermal gel 2 g  2 g Topical Daily PRN Elease Etienne, MD   2 g at 12/19/18 1328  . docusate sodium (COLACE) capsule 100 mg  100 mg Oral BID Marcellus Scott D, MD   100 mg at 12/20/18 1057  . feeding supplement (ENSURE ENLIVE) (ENSURE ENLIVE) liquid 237 mL  237 mL Oral QPM Hongalgi, Anand D, MD   237 mL at 12/19/18 1713  . furosemide (LASIX) tablet 20 mg  20 mg Oral Daily Elease Etienne, MD   20 mg at 12/20/18 1057  . gabapentin (NEURONTIN) capsule 400 mg  400 mg Oral TID Elease Etienne, MD   400 mg at 12/20/18 1058  . hydrALAZINE (APRESOLINE) tablet 100 mg  100 mg Oral TID  Elease Etienne, MD   100 mg at 12/20/18 1058  . HYDROcodone-acetaminophen (NORCO/VICODIN) 5-325 MG per tablet 1 tablet  1 tablet Oral Q6H Hongalgi, Anand D, MD   1 tablet at 12/20/18 1057  . hydroxychloroquine (PLAQUENIL) tablet 200 mg  200 mg Oral BID Marcellus Scott D, MD   200 mg at 12/20/18 1059  . isosorbide mononitrate (IMDUR) 24 hr tablet 30 mg  30 mg Oral QHS Elease Etienne, MD   30 mg at 12/19/18 2224  . leflunomide (ARAVA) tablet 20 mg  20 mg Oral Daily Roberto Scales D, MD   20 mg at 12/20/18 1057  . loratadine (CLARITIN) tablet 10 mg  10 mg Oral Daily Roberto Scales D, MD   10 mg at 12/20/18 1058  . Melatonin TABS 3 mg  1 tablet Oral QHS Elease Etienne, MD   3 mg at 12/19/18 2223  . olopatadine (PATANOL) 0.1 % ophthalmic solution 1 drop  1 drop Both Eyes BID Hongalgi, Maximino Greenland, MD   1 drop  at 12/20/18 1059  . ondansetron (ZOFRAN) tablet 4 mg  4 mg Oral Q6H PRN Hongalgi, Maximino Greenland, MD       Or  . ondansetron (ZOFRAN) injection 4 mg  4 mg Intravenous Q6H PRN Hongalgi, Anand D, MD      . polyvinyl alcohol (LIQUIFILM TEARS) 1.4 % ophthalmic solution 1 drop  1 drop Both Eyes Daily Elease Etienne, MD   1 drop at 12/20/18 1059  . predniSONE (DELTASONE) tablet 40 mg  40 mg Oral Q breakfast Roberto Scales D, MD   40 mg at 12/20/18 1059  . rivaroxaban (XARELTO) tablet 20 mg  20 mg Oral Q supper Armandina Stammer, RPH   20 mg at 12/19/18 1714  . sacubitril-valsartan (ENTRESTO) 49-51 mg per tablet  1 tablet Oral Q1200 Elease Etienne, MD   1 tablet at 12/20/18 1350  . senna (SENOKOT) tablet 8.6 mg  1 tablet Oral BID Marcellus Scott D, MD   8.6 mg at 12/20/18 1057  . triamcinolone cream (KENALOG) 0.1 % 1 application  1 application Topical BID PRN Hongalgi, Anand D, MD      . vitamin C (ASCORBIC ACID) tablet 500 mg  500 mg Oral Daily Elease Etienne, MD   500 mg at 12/20/18 1057     Discharge Medications: Please see discharge summary for a list of discharge  medications.  Relevant Imaging Results:  Relevant Lab Results:   Additional Information SSN: [id number]  Gildardo Griffes, LCSW

## 2018-12-20 NOTE — Progress Notes (Signed)
Physical Therapy Treatment Patient Details Name: [name] [name] MRN: [id number] DOB: March 06, 1930 Today's Date: 12/20/2018    History of Present Illness [name] [name] is a 85 y.o. female with history of rheumatoid arthritis, gout, diabetes mellitus type 2, diastolic CHF, PE on Xarelto, history of CVA, iron deficiency, hypertension, nonobstructive CAD was recently admitted last month for low back pain at the time MRI of the lumbar spine showed degenerative disc disease and patient was eventually discharged to rehab , [facility] from [facility] 12/10/18. Per family, patient unable to ambulate after DC. Pt dx with persistant fever (of unknown origin), fluid collection R wrist (presumed abcess based on MR imaging), aspiration of R wirst consistant with inflammatory pathology, R extremity weakness (leg and arm) subacute, and chronic combined systolic and diastolic heart failure.    PT Comments    Pt very eager to get out of bed with therapy and was happy with being able to progress her ambulation with last therapy session. Pt continues to be limited in safe mobility by decreased strength and endurance especially in LE. Pt is min A for bed mobility and modA progressing to min A for sit>stand from the side of the bed requiring increased time and effort to come all the way to standing. Once in standing pt attempted to move move LE forward but experienced bilateral knee buckling with shifting weight to advance LE R>L. Pt discouraged and sat back down. Therapy utilized Stedy to move pt to recliner. D/c plans remain appropriate at this time. PT will continue to follow acutely.     Follow Up Recommendations  SNF     Equipment Recommendations  None recommended by PT    Recommendations for Other Services       Precautions / Restrictions Precautions Precautions: Fall Precaution Comments: generalized pain in joints, right leg seems weaker than left leg Restrictions Weight Bearing Restrictions: No    Mobility  Bed  Mobility Overal bed mobility: Needs Assistance Bed Mobility: Supine to Sit     Supine to sit: Min assist;HOB elevated     General bed mobility comments: Min assist to come to sitting EOB.  Pt progressing her own legs, pt requires assist to bring trunk to upright  Transfers Overall transfer level: Needs assistance Equipment used: Rolling walker (2 wheeled) Transfers: Sit to/from Stand Sit to Stand: From elevated surface;Min assist;Mod assist         General transfer comment: mod A progressing to min A for sit>stand with 3 attempts at sit>stand from bed to RW and then to [facility]  Ambulation/Gait Ambulation/Gait assistance: Mod assist;+2 physical assistance           General Gait Details: pt with bilateral knee buckling R>L when she tried to advance feet, pt very discouraged given progress made in last therapy session.          Balance Overall balance assessment: Needs assistance Sitting-balance support: Feet supported;Bilateral upper extremity supported Sitting balance-Leahy Scale: Fair     Standing balance support: Bilateral upper extremity supported Standing balance-Leahy Scale: Poor Standing balance comment: very heavily reliant on UE support on RW and assist from therapist.                             Cognition Arousal/Alertness: Awake/alert Behavior During Therapy: WFL for tasks assessed/performed Overall Cognitive Status: Within Functional Limits for tasks assessed  Pertinent Vitals/Pain Pain Assessment: Faces Faces Pain Scale: Hurts little more Pain Location: back and legs Pain Descriptors / Indicators: Grimacing;Guarding Pain Intervention(s): Limited activity within patient's tolerance;Monitored during session;Repositioned           PT Goals (current goals can now be found in the care plan section) Acute Rehab PT Goals Patient Stated Goal: to get back to walking again PT  Goal Formulation: With patient Time For Goal Achievement: 12/26/18 Potential to Achieve Goals: Fair Progress towards PT goals: PT to reassess next treatment    Frequency    Min 2X/week      PT Plan Current plan remains appropriate       AM-PAC PT "6 Clicks" Mobility   Outcome Measure  Help needed turning from your back to your side while in a flat bed without using bedrails?: A Little Help needed moving from lying on your back to sitting on the side of a flat bed without using bedrails?: A Little Help needed moving to and from a bed to a chair (including a wheelchair)?: A Lot Help needed standing up from a chair using your arms (e.g., wheelchair or bedside chair)?: A Lot Help needed to walk in hospital room?: A Lot Help needed climbing 3-5 steps with a railing? : Total 6 Click Score: 13    End of Session Equipment Utilized During Treatment: Gait belt Activity Tolerance: Patient limited by fatigue Patient left: in chair;with call bell/phone within reach Nurse Communication: Mobility status PT Visit Diagnosis: Other abnormalities of gait and mobility (R26.89);Muscle weakness (generalized) (M62.81) Pain - Right/Left: Right Pain - part of body: Leg     Time: 5465-6812 PT Time Calculation (min) (ACUTE ONLY): 31 min  Charges:  $Gait Training: 8-22 mins $Therapeutic Activity: 8-22 mins                     Elizandro Laura B. Beverely Risen PT, DPT Acute Rehabilitation Services Pager [phone] Office [phone]    Elon Alas [facility] 12/20/2018, 3:38 PM

## 2018-12-21 ENCOUNTER — Encounter: Payer: Self-pay | Admitting: Internal Medicine

## 2018-12-21 ENCOUNTER — Non-Acute Institutional Stay (SKILLED_NURSING_FACILITY): Payer: Medicare Other | Admitting: Internal Medicine

## 2018-12-21 DIAGNOSIS — N183 Chronic kidney disease, stage 3 unspecified: Secondary | ICD-10-CM

## 2018-12-21 DIAGNOSIS — R5081 Fever presenting with conditions classified elsewhere: Secondary | ICD-10-CM | POA: Diagnosis not present

## 2018-12-21 DIAGNOSIS — I5042 Chronic combined systolic (congestive) and diastolic (congestive) heart failure: Secondary | ICD-10-CM | POA: Diagnosis not present

## 2018-12-21 DIAGNOSIS — M069 Rheumatoid arthritis, unspecified: Secondary | ICD-10-CM

## 2018-12-21 DIAGNOSIS — R29898 Other symptoms and signs involving the musculoskeletal system: Secondary | ICD-10-CM | POA: Diagnosis not present

## 2018-12-21 DIAGNOSIS — M1A09X1 Idiopathic chronic gout, multiple sites, with tophus (tophi): Secondary | ICD-10-CM

## 2018-12-21 DIAGNOSIS — M0579 Rheumatoid arthritis with rheumatoid factor of multiple sites without organ or systems involvement: Secondary | ICD-10-CM

## 2018-12-21 DIAGNOSIS — I130 Hypertensive heart and chronic kidney disease with heart failure and stage 1 through stage 4 chronic kidney disease, or unspecified chronic kidney disease: Secondary | ICD-10-CM

## 2018-12-21 NOTE — Progress Notes (Signed)
:  Location:  [facility] Room Number: 110P Place of Service:  SNF (31)  Marillyn Goren D. Lyn Hollingshead, MD  Patient Care Team: Helane Rima, DO as PCP - General (Family Medicine) Chilton Si, MD as PCP - Cardiology (Cardiology) Arlean Hopping, MD as Rounding Team (Internal Medicine)  Extended Emergency Contact Information Primary Emergency Contact: Hampton,Jeannette Address: [address]          [place], [place] [address] [place] of [place] Home Phone: [phone] Mobile Phone: [phone] Relation: Daughter Secondary Emergency Contact: [name] Address: [address]          [place], [place] [address] [place] of [place] Mobile Phone: [phone] Relation: Daughter     Allergies: Clonidine derivatives; Fish allergy; Shellfish allergy; Doxycycline; Indomethacin; Lyrica [pregabalin]; Methyldopa; Morphine and related; Orange fruit [citrus]; Strawberry (diagnostic); Cetirizine hcl; Codeine; Levaquin [levofloxacin in d5w]; and Tomato  Chief Complaint  Patient presents with  . New Admit To SNF    Admit to [facility]    HPI: Patient is 85 y.o. female with combined systolic/diastolic congestive heart failure, CAD, PE on Xarelto, diabetes, RA on leflunomide Plaquenil and prednisone, prior stroke, hypertension, gout, severe lumbar spinal stenosis who presented on 12/31/2018 with reports of weakness to right leg and difficulty ambulating/transferring per family.  Patient recently discharged from skilled nursing facility and found to have decreased ability to bear weight on her right leg and reported fever.  Patient denied any urine/bowel incontinence.  She denied fever, denied localized symptoms other than mild cough.  Patient was admitted to [facility] hospital from 2/2-11 due to fevers of unknown etiology with concern for potential infectious process related right wrist nodule believed to be a wrist abscess.  Patient was transferred  from [facility] to [facility] for surgical debridement of presumed right wrist abscess as potential etiology for recurrent fever.  Evaluation of the bedside on 2/5 with aspiration of serosanguineous fluid with no concern for actual infection or abscess.  Found to have chronic longstanding nodule related to her RA.  Hospital course was complicated by right lower extremity weakness subacute appeared to be improving.  All other chronic problems were stable.  Patient is admitted to skilled nursing facility for OT/PT.  While at skilled nursing facility patient will be followed for congestive heart failure treated with Lasix, Entresto and Coreg, hypertension treated with hydralazine and Coreg and Lasix and gout treated with allopurinol.  Past Medical History:  Diagnosis Date  . Allergic rhinitis due to pollen 04/27/2007  . Arthritis    "in q joint" (08/07/2013)  . CHF (congestive heart failure) (HCC)   . Coronary atherosclerosis of native coronary artery   . Cough   . Disorder of bone and cartilage, unspecified   . Edema 05/03/2013  . Exertional shortness of breath    "sometimes" (08/07/2013)  . First degree atrioventricular block   . Gout, unspecified 04/19/2013  . Hyperlipidemia 04/27/2007  . Hypertension   . Midline low back pain without sciatica 06/27/2014  . Muscle spasm   . Muscle weakness (generalized)   . Myocardial infarction (HCC) ~ 1970  . Onychia and paronychia of toe   . Osteoarthrosis, unspecified whether generalized or localized, unspecified site 04/27/2007  . Other fall   . Other malaise and fatigue   . Pneumonia 05/2018  . Prepatellar bursitis   . Pulmonary embolism (HCC) 07/2018  . Rheumatic nodule 06/28/2017  . Seizures (HCC)   . Stroke (HCC) 01/17/2014  . TIA (transient  ischemic attack)    "a few one summer" (08/07/2013)  . Type II diabetes mellitus (HCC)    "fasting 90-110s" (08/07/2013)  . Unspecified essential hypertension 08/08/2013  . Unspecified vitamin D deficiency       Past Surgical History:  Procedure Laterality Date  . ABDOMINAL HYSTERECTOMY  04/1980  . APPENDECTOMY  1960  . CARDIAC CATHETERIZATION  2003  . CATARACT EXTRACTION W/ INTRAOCULAR LENS  IMPLANT, BILATERAL Bilateral ~ 2012  . CHOLECYSTECTOMY N/A 06/26/2018   Procedure: LAPAROSCOPIC CHOLECYSTECTOMY POSSIBLE INTRAOPERATIVE CHOLANGIOGRAM;  Surgeon: Abigail Miyamoto, MD;  Location: [facility] OR;  Service: General;  Laterality: N/A;  . JOINT REPLACEMENT    . REPLACEMENT TOTAL KNEE Right 09/2005  . RIGHT/LEFT HEART CATH AND CORONARY ANGIOGRAPHY N/A 06/01/2018   Procedure: RIGHT/LEFT HEART CATH AND CORONARY ANGIOGRAPHY;  Surgeon: Corky Crafts, MD;  Location: [facility] INVASIVE CV LAB;  Service: Cardiovascular;  Laterality: N/A;  . SHOULDER OPEN ROTATOR CUFF REPAIR Right 07/1999  . TONSILLECTOMY  08/1974  . TOTAL KNEE ARTHROPLASTY Left 08/07/2013   Procedure: TOTAL KNEE ARTHROPLASTY- LEFT;  Surgeon: Dannielle Huh, MD;  Location: [facility] OR;  Service: Orthopedics;  Laterality: Left;  . TRANSTHORACIC ECHOCARDIOGRAM  2003   EF 55-65%; mild concentric LVH    Allergies as of 12/21/2018      Reactions   Clonidine Derivatives Swelling   [name]   Fish Allergy Diarrhea, Swelling, Other (See Comments)   Turns skin "black," but can tolerate white fish Salmon- Diarrhea   Shellfish Allergy Hives   Doxycycline Rash   Indomethacin Other (See Comments)   Reaction not recalled by the patient   Lyrica [pregabalin] Other (See Comments)   Hallucinations   Methyldopa Other (See Comments)   Aldomet (for hypertension): Reaction not recalled by the patient   Morphine And Related Other (See Comments)   Family reports it drops her bp that she needs iv fluids   Orange Fruit [citrus] Other (See Comments)   Indigestion/heartburn   Strawberry (diagnostic) Itching   Cetirizine Hcl Itching, Rash   Codeine Itching   Levaquin [levofloxacin In D5w] Rash    Tomato Rash      Medication List       Accurate as of December 21, 2018 11:48 AM. Always use your most recent med list.        albuterol 108 (90 Base) MCG/ACT inhaler Commonly known as:  PROVENTIL HFA;VENTOLIN HFA Inhale 1-2 puffs into the lungs every 6 (six) hours as needed for wheezing or shortness of breath.   allopurinol 100 MG tablet Commonly known as:  ZYLOPRIM Take 1 tablet (100 mg total) by mouth at bedtime.   carvedilol 25 MG tablet Commonly known as:  COREG Take 1 tablet (25 mg total) by mouth 2 (two) times daily with a meal.   D3-1000 25 MCG (1000 UT) capsule Generic drug:  Cholecalciferol Take 1 capsule (1,000 Units total) by mouth daily.   diclofenac sodium 1 % Gel Commonly known as:  VOLTAREN Apply 2 g topically daily as needed (pain).   docusate sodium 100 MG capsule Commonly known as:  COLACE Take 1 capsule (100 mg total) by mouth daily as needed for mild constipation.   ENSURE Take 237 mLs by mouth every evening.   furosemide 20 MG tablet Commonly known as:  LASIX Take 1 tablet (20 mg total) by mouth daily.   gabapentin 400 MG capsule Commonly known as:  NEURONTIN Take 1 capsule (400 mg total) by  mouth 3 (three) times daily.   hydrALAZINE 100 MG tablet Commonly known as:  APRESOLINE Take 1 tablet (100 mg total) by mouth 3 (three) times daily.   HYDROcodone-acetaminophen 5-325 MG tablet Commonly known as:  NORCO/VICODIN Take 1 tablet by mouth every 6 (six) hours.   hydroxychloroquine 200 MG tablet Commonly known as:  PLAQUENIL Take 1 tablet (200 mg total) by mouth 2 (two) times daily.   hydroxypropyl methylcellulose / hypromellose 2.5 % ophthalmic solution Commonly known as:  ISOPTO TEARS / GONIOVISC Place 1 drop into both eyes daily.   isosorbide mononitrate 30 MG 24 hr tablet Commonly known as:  IMDUR Take 1 tablet (30 mg total) by mouth at bedtime.   leflunomide 20 MG tablet Commonly known as:  ARAVA Take 1 tablet (20 mg total) by  mouth daily.   loratadine 10 MG tablet Commonly known as:  CLARITIN Take 1 tablet (10 mg total) by mouth daily.   Melatonin 3 MG Tabs Take 1 tablet (3 mg total) by mouth at bedtime.   Olopatadine HCl 0.2 % Soln Place 1 drop into both eyes daily.   predniSONE 10 MG tablet Commonly known as:  DELTASONE 2/12 start 20 mg x 3 days then resume 10 mg on 2/15   rivaroxaban 20 MG Tabs tablet Commonly known as:  XARELTO Take 1 tablet (20 mg total) by mouth daily with supper.   sacubitril-valsartan 49-51 MG Commonly known as:  ENTRESTO Take 1 tablet by mouth daily at 12 noon.   triamcinolone cream 0.1 % Commonly known as:  KENALOG Apply 1 application topically 2 (two) times daily as needed (for itching- to affected sites).   vitamin C 500 MG tablet Commonly known as:  ASCORBIC ACID Take 1 tablet (500 mg total) by mouth daily.       No orders of the defined types were placed in this encounter.   Immunization History  Administered Date(s) Administered  . Influenza Whole 09/06/2012  . Influenza, High Dose Seasonal PF 09/13/2017  . Influenza,inj,Quad PF,6+ Mos 09/12/2013, 09/11/2014, 07/23/2015, 09/16/2016, 09/07/2018  . Pneumococcal Polysaccharide-23 04/13/2005, 11/09/2009, 09/12/2015  . Td 04/13/2005  . Zoster 11/10/2011    Social History   Tobacco Use  . Smoking status: Never Smoker  . Smokeless tobacco: Never Used  Substance Use Topics  . Alcohol use: No    Alcohol/week: 0.0 standard drinks    Family history is   Family History  Problem Relation Age of Onset  . Heart attack Father       Review of Systems  DATA OBTAINED: from patient, nurse GENERAL:  no fevers, fatigue, appetite changes SKIN: No itching, or rash EYES: No eye pain, redness, discharge EARS: No earache, tinnitus, change in hearing NOSE: No congestion, drainage or bleeding  MOUTH/THROAT: No mouth or tooth pain, No sore throat RESPIRATORY: No cough, wheezing, SOB CARDIAC: No chest pain,  palpitations, lower extremity edema  GI: No abdominal pain, No N/V/D or constipation, No heartburn or reflux  GU: No dysuria, frequency or urgency, or incontinence  MUSCULOSKELETAL: No unrelieved bone/joint pain NEUROLOGIC: No headache, dizziness or focal weakness PSYCHIATRIC: No c/o anxiety or sadness   Vitals:   12/21/18 1140  BP: 140/82  Pulse: 93  Resp: 18  Temp: 98.7 F (37.1 C)    SpO2 Readings from Last 1 Encounters:  12/20/18 98%   Body mass index is 31.76 kg/m.     Physical Exam  GENERAL APPEARANCE: Alert, conversant,  No acute distress.  SKIN: No diaphoresis rash HEAD:  Normocephalic, atraumatic  EYES: Conjunctiva/lids clear. Pupils round, reactive. EOMs intact.  EARS: External exam WNL, canals clear. Hearing grossly normal.  NOSE: No deformity or discharge.  MOUTH/THROAT: Lips w/o lesions  RESPIRATORY: Breathing is even, unlabored. Lung sounds are clear   CARDIOVASCULAR: Heart RRR no murmurs, rubs or gallops. No peripheral edema.   GASTROINTESTINAL: Abdomen is soft, non-tender, not distended w/ normal bowel sounds. GENITOURINARY: Bladder non tender, not distended  MUSCULOSKELETAL: No abnormal joints or musculature NEUROLOGIC:  Cranial nerves 2-12 grossly intact. Moves all extremities  PSYCHIATRIC: Mood and affect appropriate to situation, no behavioral issues  Patient Active Problem List   Diagnosis Date Noted  . Abscess of right upper extremity 12/14/2018  . Fever 12/13/2018  . Hand swelling 12/02/2018  . Pain, wrist 12/02/2018  . Hand pain 12/02/2018  . Polyarthritis 12/02/2018  . Spinal stenosis, lumbar 11/19/2018  . Acute encephalopathy 11/19/2018  . History of CVA (cerebrovascular accident) 11/19/2018  . Acute gout 11/15/2018  . Low back pain 11/14/2018  . Chronic back pain 10/27/2018  . Arthralgia of both knees 10/27/2018  . Rheumatoid arthritis involving both knees (HCC) 10/27/2018  . Acute pain 10/21/2018  . Physical deconditioning  09/06/2018  . Hyperlipidemia associated with type 2 diabetes mellitus (HCC), refuses statin 09/06/2018  . Refusal of statin medication by patient 09/04/2018  . Deep vein thrombosis (DVT) of distal vein of right lower extremity (HCC) 07/30/2018  . Hypertensive heart disease with heart failure (HCC) 07/30/2018  . Pulmonary embolism (HCC), 07/2018, on Xarelto with goal to DC after 6 months if more active 07/10/2018  . DCM (dilated cardiomyopathy) (HCC)   . Benign hypertensive heart and kidney disease with CHF and stage 3 chronic kidney disease (HCC) 05/28/2018  . Osteoarthritis 05/06/2018  . Hypokalemia 02/02/2018  . Adhesive capsulitis of right shoulder 10/12/2017  . Idiopathic chronic gout of multiple sites with tophus 08/25/2017  . History of total knee arthroplasty, bilateral 02/18/2017  . History of rotator cuff surgery 02/18/2017  . High risk medications, long-term use 09/21/2016  . Chronic combined systolic and diastolic CHF (congestive heart failure) (HCC) 09/12/2015  . Bilateral edema of lower extremity 06/05/2014  . CKD stage 3 due to type 2 diabetes mellitus (HCC) 06/05/2014  . DM (diabetes mellitus), type 2 with renal complications (HCC) 06/05/2014    Class: Chronic  . Anemia of chronic disease 05/01/2014  . Rheumatoid arthritis involving multiple sites (HCC) 12/19/2013    Class: Chronic  . CAD (coronary artery disease) 10/18/2013  . Hypertension associated with diabetes (HCC) 08/08/2013    Class: Chronic  . Fibromyalgia 05/10/2013  . Type 2 diabetes mellitus with hyperlipidemia (HCC), patient declines statin 04/27/2007  . Allergic rhinitis 04/27/2007  . Diverticulosis 04/27/2007      Labs reviewed: Basic Metabolic Panel:    Component Value Date/Time   NA 138 12/17/2018 0712   NA 141 12/08/2018   K 3.9 12/17/2018 0712   CL 106 12/17/2018 0712   CO2 25 12/17/2018 0712   GLUCOSE 167 (H) 12/17/2018 0712   BUN 23 12/17/2018 0712   BUN 16 12/08/2018   CREATININE 1.10  (H) 12/20/2018 0255   CREATININE 1.19 (H) 07/21/2017 1454   CALCIUM 8.1 (L) 12/17/2018 0712   PROT 5.5 (L) 12/14/2018 0613   PROT 7.2 09/09/2015 1210   ALBUMIN 2.4 (L) 12/14/2018 0613   ALBUMIN 3.9 09/09/2015 1210   AST 19 12/14/2018 0613   ALT 11 12/14/2018 0613   ALKPHOS 39 12/14/2018 0613   BILITOT 0.3  12/14/2018 0613   BILITOT 0.5 09/09/2015 1210   GFRNONAA 45 (L) 12/20/2018 0255   GFRNONAA 42 (L) 07/21/2017 1454   GFRAA 53 (L) 12/20/2018 0255   GFRAA 48 (L) 07/21/2017 1454    Recent Labs    06/01/18 0459  06/27/18 0630  09/05/18 2018  10/22/18 0344  11/15/18 0508  12/14/18 1610 12/16/18 0159 12/17/18 0712 12/18/18 0428 12/20/18 0255  NA 141  140   < > 140   < >  --    < > 137   < > 135   < > 134* 139 138  --   --   K 3.5  3.5   < > 3.5   < >  --    < > 3.6   < > 3.3*   < > 3.7 3.9 3.9  --   --   CL 108  107   < > 104   < >  --    < > 104   < > 103   < > 104 104 106  --   --   CO2 26  27   < > 26   < >  --    < > 24   < > 24   < > --   --   GLUCOSE 109*  110*   < > 108*   < >  --    < > 177*   < > 80   < > 178* 142* 167*  --   --   BUN 22  21   < > 17   < >  --    < > 17   < > 12   < > --   --   CREATININE 1.00  0.98   < > 1.10*   < >  --    < > 0.86   < > 1.01*   < > 1.00 1.22* 1.12* 1.13* 1.10*  CALCIUM 8.9  8.9   < > 8.7*   < >  --    < > 8.6*   < > 8.4*   < > 7.9* 8.2* 8.1*  --   --   MG  --   --   --   --  1.8  --  1.9  --  1.6*  --   --   --   --   --   --   PHOS 2.7  --  2.7  --   --   --   --   --   --   --   --   --   --   --   --    < > = values in this interval not displayed.   Liver Function Tests: Recent Labs    12/11/18 1103 12/13/18 0845 12/14/18 0613  AST ALT ALKPHOS 42 36* 39  BILITOT 0.9 0.5 0.3  PROT 6.1* 5.5* 5.5*  ALBUMIN 3.1* 2.6* 2.4*   Recent Labs    06/22/18 2153 06/23/18 0500 07/23/18 2100  LIPASE 150* 77* 50   Recent Labs    11/15/18 1830  AMMONIA 10   CBC: Recent Labs     11/14/18 1635 11/15/18 0508  12/11/18 1103  12/15/18 0203 12/16/18 0159 12/17/18 0712  WBC 6.4 10.5   < > 6.1   < > 6.7 6.5 6.8  NEUTROABS 4.1 7.4  --  3.6  --   --   --   --   HGB 10.1* 10.2*   < > 9.2*   < > 7.9* 7.8* 8.4*  HCT 31.6* 31.3*   < > 29.0*   < > 24.2* 24.0* 26.7*  MCV 98.8 97.8   < > 98.6   < > 91.7 93.0 93.0  PLT 272 238   < > 298   < > 249 268 299   < > = values in this interval not displayed.   Lipid Recent Labs    05/29/18 0030 06/24/18 0524 06/25/18 0607  CHOL 178 189 202*  HDL 73 63 63  LDLCALC 92 113* 126*  TRIG 64 65 67    Cardiac Enzymes: Recent Labs    09/05/18 2018 09/06/18 0131 09/06/18 0716  TROPONINI 0.04* 0.03* 0.03*   BNP: Recent Labs    05/28/18 1614 06/22/18 2153 07/23/18 2100  BNP 2,077.6* 1,146.7* 138.3*   No results found for: [facility] Lab Results  Component Value Date   HGBA1C 5.9 (H) 06/25/2018   Lab Results  Component Value Date   TSH 1.095 11/15/2018   Lab Results  Component Value Date   VITAMINB12 419 10/21/2018   Lab Results  Component Value Date   FOLATE >19.9 07/03/2014   Lab Results  Component Value Date   IRON 17 (L) 10/21/2018   TIBC 233 (L) 10/21/2018   FERRITIN 215 10/21/2018    Imaging and Procedures obtained prior to SNF admission: No results found.   Not all labs, radiology exams or other studies done during hospitalization come through on my EPIC note; however they are reviewed by me.    Assessment and Plan  Persistent fever, presumed secondary to RA flare, resolved, not secondary to RA flare/rheumatoid arthritis- concern for abscess after MRI of wrist, hand surgery evaluated at bedside on 2/5 and aspirated serosanguineous fluid consistent with inflammatory arthritis.  Patient was taken off of IV vancomycin and cefepime and started on prednisone and resumed RA medications with resolution of fevers and improvement in clinical status SNF- patient admitted for OT/PT; continue prednisone 20  mg for 3 days and then back to baseline of 10 mg daily; continue leflunomide 20 mg daily, Plaquenil 200 mg twice daily   Right extremity weakness, subacute, improving- initial concern that this may be due to symptomatic lumbar stenosis however MRI shows stability; improvement with initiation of prednisone for RA flare SNF- admitted for OT/PT  Chronic combined systolic and diastolic congestive heart failure- patient euvolemic SNF- continue Lasix 20 mg daily, Coreg 25 mg twice daily and Entresto 49-51 1 p.o. daily  Hypertension-stable SNF- continue Coreg 25 mg twice daily, hydralazine 100 mg 3 times daily, Lasix 20 mg daily  Gout SNF- stable; continue allopurinol 100 nightly   Time spent greater than 45 minutes;> 50% of time with patient was spent reviewing records, labs, tests and studies, counseling and developing plan of care  Thurston Hole D. Lyn Hollingshead, MD

## 2018-12-22 ENCOUNTER — Other Ambulatory Visit: Payer: Self-pay | Admitting: Internal Medicine

## 2018-12-22 MED ORDER — HYDROCODONE-ACETAMINOPHEN 5-325 MG PO TABS: 1.0000 | ORAL_TABLET | Freq: Four times a day (QID) | ORAL | 0 refills | Status: DC

## 2018-12-23 ENCOUNTER — Encounter: Payer: Self-pay | Admitting: Internal Medicine

## 2018-12-23 DIAGNOSIS — R29898 Other symptoms and signs involving the musculoskeletal system: Secondary | ICD-10-CM | POA: Insufficient documentation

## 2018-12-23 DIAGNOSIS — M069 Rheumatoid arthritis, unspecified: Secondary | ICD-10-CM | POA: Insufficient documentation

## 2018-12-27 ENCOUNTER — Telehealth: Payer: Self-pay

## 2018-12-27 LAB — BASIC METABOLIC PANEL
BUN: 39 — AB (ref 4–21)
Creatinine: 1 (ref 0.5–1.1)
Glucose: 91
Potassium: 4.4 (ref 3.4–5.3)
Sodium: 142 (ref 137–147)

## 2018-12-27 LAB — CBC AND DIFFERENTIAL
HCT: 29 — AB (ref 36–46)
Hemoglobin: 9.4 — AB (ref 12.0–16.0)
Platelets: 378 (ref 150–399)
WBC: 11.4

## 2018-12-27 NOTE — Telephone Encounter (Signed)
Daughter called to c/a appointment for tomorrow. She is still in rehab. Will call when she gets out to make f/u app.

## 2018-12-28 ENCOUNTER — Inpatient Hospital Stay: Payer: Medicare Other | Admitting: Family Medicine

## 2019-01-04 ENCOUNTER — Inpatient Hospital Stay: Payer: Medicare Other | Admitting: Family Medicine

## 2019-01-05 ENCOUNTER — Telehealth: Payer: Self-pay | Admitting: *Deleted

## 2019-01-05 DIAGNOSIS — M5431 Sciatica, right side: Secondary | ICD-10-CM | POA: Diagnosis not present

## 2019-01-05 DIAGNOSIS — M4726 Other spondylosis with radiculopathy, lumbar region: Secondary | ICD-10-CM | POA: Diagnosis not present

## 2019-01-05 DIAGNOSIS — M48062 Spinal stenosis, lumbar region with neurogenic claudication: Secondary | ICD-10-CM | POA: Diagnosis not present

## 2019-01-05 NOTE — Telephone Encounter (Signed)
   [facility] Pre-operative Risk Assessment    Request for surgical clearance:  1. What type of surgery is being performed?  DECOMPRESSIVE LUMBAR LAMINECTOMY L4-5  L 5 S1  2. When is this surgery scheduled? TBD  3. What type of clearance is required (medical clearance vs. Pharmacy clearance to hold med vs. Both)? MEDICAL  4. Are there any medications that need to be held prior to surgery and how long? XARELTO FOLLOWED BY PMD  5. Practice name and name of physician performing surgery? [facility]  6. What is your office phone number [phone]   7.   What is your office fax number [phone]  8.   Anesthesia type (None, local, MAC, general) ?  GENERAL   Devra Dopp 01/05/2019, 2:16 PM  _________________________________________________________________   (provider comments below)

## 2019-01-05 NOTE — Telephone Encounter (Signed)
   Primary Cardiologist:Tiffany Duke Salvia, MD  Chart reviewed as part of pre-operative protocol coverage. Pre-op clearance already addressed by colleagues in earlier consult note. Pt was evaluated by Dr. Melburn Popper on 12/15/18 during recent hospitalization for preop clearance for spine surgery.   To summarize recommendations:  "She is at low - moderate risk for her cervical surgery ( due to age and other medical issues ).  She does not need any further cardiac work up"   Pt can be cleared for surgery w/o any further cardiac work up.  Her PCP manages her Xarelto given h/o DVT/ PE and CVA. Recs regarding Xarelto will need to come from PCP.   Will route this bundled recommendation to requesting provider via Epic fax function. Please call with questions.  Robbie Lis, PA-C 01/05/2019, 4:00 PM

## 2019-01-05 NOTE — Telephone Encounter (Signed)
SEE PREVIOUS NOTE FROM JAN 30 ./CY

## 2019-01-09 ENCOUNTER — Observation Stay (HOSPITAL_COMMUNITY)
Admission: EM | Admit: 2019-01-09 | Discharge: 2019-01-12 | Disposition: A | Discharge status: Skilled Nursing Facility | Payer: Medicare Other | Attending: Internal Medicine | Admitting: Internal Medicine

## 2019-01-09 ENCOUNTER — Emergency Department (HOSPITAL_COMMUNITY): Payer: Medicare Other

## 2019-01-09 ENCOUNTER — Inpatient Hospital Stay (HOSPITAL_COMMUNITY): Payer: Medicare Other

## 2019-01-09 ENCOUNTER — Telehealth: Payer: Self-pay

## 2019-01-09 ENCOUNTER — Other Ambulatory Visit: Payer: Self-pay

## 2019-01-09 ENCOUNTER — Encounter (HOSPITAL_COMMUNITY): Payer: Self-pay | Admitting: Emergency Medicine

## 2019-01-09 DIAGNOSIS — G9340 Encephalopathy, unspecified: Secondary | ICD-10-CM | POA: Insufficient documentation

## 2019-01-09 DIAGNOSIS — E876 Hypokalemia: Secondary | ICD-10-CM | POA: Diagnosis not present

## 2019-01-09 DIAGNOSIS — R400 Somnolence: Secondary | ICD-10-CM | POA: Diagnosis not present

## 2019-01-09 DIAGNOSIS — I420 Dilated cardiomyopathy: Secondary | ICD-10-CM | POA: Diagnosis not present

## 2019-01-09 DIAGNOSIS — I2510 Atherosclerotic heart disease of native coronary artery without angina pectoris: Secondary | ICD-10-CM | POA: Insufficient documentation

## 2019-01-09 DIAGNOSIS — I252 Old myocardial infarction: Secondary | ICD-10-CM | POA: Insufficient documentation

## 2019-01-09 DIAGNOSIS — R569 Unspecified convulsions: Secondary | ICD-10-CM | POA: Insufficient documentation

## 2019-01-09 DIAGNOSIS — E785 Hyperlipidemia, unspecified: Secondary | ICD-10-CM | POA: Insufficient documentation

## 2019-01-09 DIAGNOSIS — M109 Gout, unspecified: Secondary | ICD-10-CM | POA: Diagnosis not present

## 2019-01-09 DIAGNOSIS — R059 Cough, unspecified: Secondary | ICD-10-CM

## 2019-01-09 DIAGNOSIS — I130 Hypertensive heart and chronic kidney disease with heart failure and stage 1 through stage 4 chronic kidney disease, or unspecified chronic kidney disease: Secondary | ICD-10-CM | POA: Diagnosis not present

## 2019-01-09 DIAGNOSIS — R05 Cough: Secondary | ICD-10-CM | POA: Diagnosis not present

## 2019-01-09 DIAGNOSIS — L8990 Pressure ulcer of unspecified site, unspecified stage: Secondary | ICD-10-CM

## 2019-01-09 DIAGNOSIS — Z8673 Personal history of transient ischemic attack (TIA), and cerebral infarction without residual deficits: Secondary | ICD-10-CM | POA: Diagnosis not present

## 2019-01-09 DIAGNOSIS — Z86718 Personal history of other venous thrombosis and embolism: Secondary | ICD-10-CM | POA: Diagnosis not present

## 2019-01-09 DIAGNOSIS — E1122 Type 2 diabetes mellitus with diabetic chronic kidney disease: Secondary | ICD-10-CM | POA: Insufficient documentation

## 2019-01-09 DIAGNOSIS — N183 Chronic kidney disease, stage 3 (moderate): Secondary | ICD-10-CM | POA: Diagnosis not present

## 2019-01-09 DIAGNOSIS — R0602 Shortness of breath: Secondary | ICD-10-CM | POA: Diagnosis not present

## 2019-01-09 DIAGNOSIS — M069 Rheumatoid arthritis, unspecified: Secondary | ICD-10-CM | POA: Diagnosis not present

## 2019-01-09 DIAGNOSIS — M48061 Spinal stenosis, lumbar region without neurogenic claudication: Secondary | ICD-10-CM | POA: Diagnosis not present

## 2019-01-09 DIAGNOSIS — S81801A Unspecified open wound, right lower leg, initial encounter: Secondary | ICD-10-CM

## 2019-01-09 DIAGNOSIS — R7989 Other specified abnormal findings of blood chemistry: Secondary | ICD-10-CM | POA: Diagnosis not present

## 2019-01-09 DIAGNOSIS — R0689 Other abnormalities of breathing: Secondary | ICD-10-CM | POA: Diagnosis not present

## 2019-01-09 DIAGNOSIS — D649 Anemia, unspecified: Secondary | ICD-10-CM | POA: Insufficient documentation

## 2019-01-09 DIAGNOSIS — I5042 Chronic combined systolic (congestive) and diastolic (congestive) heart failure: Secondary | ICD-10-CM | POA: Insufficient documentation

## 2019-01-09 DIAGNOSIS — Z7901 Long term (current) use of anticoagulants: Secondary | ICD-10-CM | POA: Diagnosis not present

## 2019-01-09 DIAGNOSIS — Z86711 Personal history of pulmonary embolism: Secondary | ICD-10-CM | POA: Diagnosis not present

## 2019-01-09 DIAGNOSIS — R0989 Other specified symptoms and signs involving the circulatory and respiratory systems: Secondary | ICD-10-CM | POA: Diagnosis not present

## 2019-01-09 DIAGNOSIS — R531 Weakness: Secondary | ICD-10-CM | POA: Diagnosis not present

## 2019-01-09 DIAGNOSIS — Z791 Long term (current) use of non-steroidal anti-inflammatories (NSAID): Secondary | ICD-10-CM | POA: Insufficient documentation

## 2019-01-09 DIAGNOSIS — E559 Vitamin D deficiency, unspecified: Secondary | ICD-10-CM | POA: Insufficient documentation

## 2019-01-09 DIAGNOSIS — Z79899 Other long term (current) drug therapy: Secondary | ICD-10-CM | POA: Insufficient documentation

## 2019-01-09 DIAGNOSIS — I10 Essential (primary) hypertension: Secondary | ICD-10-CM | POA: Diagnosis not present

## 2019-01-09 DIAGNOSIS — R4182 Altered mental status, unspecified: Secondary | ICD-10-CM | POA: Diagnosis present

## 2019-01-09 LAB — CBC WITH DIFFERENTIAL/PLATELET
Abs Immature Granulocytes: 0.03 10*3/uL (ref 0.00–0.07)
Basophils Absolute: 0 10*3/uL (ref 0.0–0.1)
Basophils Relative: 0 %
Eosinophils Absolute: 0.3 10*3/uL (ref 0.0–0.5)
Eosinophils Relative: 3 %
HCT: 26.8 % — ABNORMAL LOW (ref 36.0–46.0)
Hemoglobin: 8.4 g/dL — ABNORMAL LOW (ref 12.0–15.0)
Immature Granulocytes: 0 %
Lymphocytes Relative: 12 %
Lymphs Abs: 0.9 10*3/uL (ref 0.7–4.0)
MCH: 30.8 pg (ref 26.0–34.0)
MCHC: 31.3 g/dL (ref 30.0–36.0)
MCV: 98.2 fL (ref 80.0–100.0)
Monocytes Absolute: 0.4 10*3/uL (ref 0.1–1.0)
Monocytes Relative: 5 %
Neutro Abs: 6 10*3/uL (ref 1.7–7.7)
Neutrophils Relative %: 80 %
Platelets: 158 10*3/uL (ref 150–400)
RBC: 2.73 MIL/uL — ABNORMAL LOW (ref 3.87–5.11)
RDW: 21.6 % — ABNORMAL HIGH (ref 11.5–15.5)
WBC: 7.5 10*3/uL (ref 4.0–10.5)
nRBC: 0 % (ref 0.0–0.2)

## 2019-01-09 LAB — URINALYSIS, ROUTINE W REFLEX MICROSCOPIC
Bilirubin Urine: NEGATIVE
Glucose, UA: NEGATIVE mg/dL
Hgb urine dipstick: NEGATIVE
Ketones, ur: NEGATIVE mg/dL
Leukocytes,Ua: NEGATIVE
Nitrite: NEGATIVE
Protein, ur: NEGATIVE mg/dL
Specific Gravity, Urine: 1.004 — ABNORMAL LOW (ref 1.005–1.030)
pH: 7 (ref 5.0–8.0)

## 2019-01-09 LAB — MRSA PCR SCREENING: MRSA by PCR: NEGATIVE

## 2019-01-09 LAB — CBG MONITORING, ED: Glucose-Capillary: 121 mg/dL — ABNORMAL HIGH (ref 70–99)

## 2019-01-09 LAB — COMPREHENSIVE METABOLIC PANEL
ALT: 19 U/L (ref 0–44)
AST: 23 U/L (ref 15–41)
Albumin: 2.9 g/dL — ABNORMAL LOW (ref 3.5–5.0)
Alkaline Phosphatase: 47 U/L (ref 38–126)
Anion gap: 11 (ref 5–15)
BUN: 22 mg/dL (ref 8–23)
CO2: 20 mmol/L — ABNORMAL LOW (ref 22–32)
Calcium: 8.3 mg/dL — ABNORMAL LOW (ref 8.9–10.3)
Chloride: 107 mmol/L (ref 98–111)
Creatinine, Ser: 0.99 mg/dL (ref 0.44–1.00)
GFR calc Af Amer: 60 mL/min — ABNORMAL LOW (ref >60)
GFR calc non Af Amer: 52 mL/min — ABNORMAL LOW (ref >60)
Glucose, Bld: 115 mg/dL — ABNORMAL HIGH (ref 70–99)
Potassium: 3.6 mmol/L (ref 3.5–5.1)
Sodium: 138 mmol/L (ref 135–145)
Total Bilirubin: 0.8 mg/dL (ref 0.3–1.2)
Total Protein: 5.5 g/dL — ABNORMAL LOW (ref 6.5–8.1)

## 2019-01-09 LAB — BRAIN NATRIURETIC PEPTIDE: B Natriuretic Peptide: 2955.1 pg/mL — ABNORMAL HIGH (ref 0.0–100.0)

## 2019-01-09 LAB — INFLUENZA PANEL BY PCR (TYPE A & B)
Influenza A By PCR: NEGATIVE
Influenza B By PCR: NEGATIVE

## 2019-01-09 LAB — LACTIC ACID, PLASMA
Lactic Acid, Venous: 0.8 mmol/L (ref 0.5–1.9)
Lactic Acid, Venous: 0.8 mmol/L (ref 0.5–1.9)

## 2019-01-09 LAB — TROPONIN I: Troponin I: 0.04 ng/mL (ref <0.03)

## 2019-01-09 LAB — GLUCOSE, CAPILLARY
Glucose-Capillary: 93 mg/dL (ref 70–99)
Glucose-Capillary: 99 mg/dL (ref 70–99)

## 2019-01-09 LAB — PROTIME-INR
INR: 2 — ABNORMAL HIGH (ref 0.8–1.2)
Prothrombin Time: 22.6 seconds — ABNORMAL HIGH (ref 11.4–15.2)

## 2019-01-09 MED ORDER — CARVEDILOL 25 MG PO TABS
25.0000 mg | ORAL_TABLET | Freq: Two times a day (BID) | ORAL | Status: DC
  Administered 2019-01-10 – 2019-01-12 (×5): 25 mg via ORAL
  Filled 2019-01-09 (×5): qty 1

## 2019-01-09 MED ORDER — LORATADINE 10 MG PO TABS
10.0000 mg | ORAL_TABLET | Freq: Every day | ORAL | Status: DC
  Administered 2019-01-10 – 2019-01-12 (×3): 10 mg via ORAL
  Filled 2019-01-09 (×3): qty 1

## 2019-01-09 MED ORDER — HYDROCORTISONE NA SUCCINATE PF 100 MG IJ SOLR
50.0000 mg | Freq: Four times a day (QID) | INTRAMUSCULAR | Status: DC
  Administered 2019-01-09 – 2019-01-10 (×5): 50 mg via INTRAVENOUS
  Filled 2019-01-09 (×5): qty 2

## 2019-01-09 MED ORDER — RIVAROXABAN 20 MG PO TABS
20.0000 mg | ORAL_TABLET | Freq: Every day | ORAL | Status: DC
  Administered 2019-01-10 – 2019-01-11 (×2): 20 mg via ORAL
  Filled 2019-01-09 (×4): qty 1

## 2019-01-09 MED ORDER — ONDANSETRON HCL 4 MG/2ML IJ SOLN: 4.0000 mg | Freq: Four times a day (QID) | INTRAMUSCULAR | Status: DC | PRN

## 2019-01-09 MED ORDER — SODIUM CHLORIDE 0.9% FLUSH
3.0000 mL | Freq: Two times a day (BID) | INTRAVENOUS | Status: DC
  Administered 2019-01-09 – 2019-01-11 (×5): 3 mL via INTRAVENOUS

## 2019-01-09 MED ORDER — HYDRALAZINE HCL 50 MG PO TABS
100.0000 mg | ORAL_TABLET | Freq: Three times a day (TID) | ORAL | Status: DC
  Administered 2019-01-09 – 2019-01-12 (×8): 100 mg via ORAL
  Filled 2019-01-09 (×8): qty 2

## 2019-01-09 MED ORDER — HYDRALAZINE HCL 20 MG/ML IJ SOLN: 5.0000 mg | INTRAMUSCULAR | Status: DC | PRN

## 2019-01-09 MED ORDER — ALLOPURINOL 100 MG PO TABS
100.0000 mg | ORAL_TABLET | Freq: Every day | ORAL | Status: DC
  Administered 2019-01-09 – 2019-01-11 (×3): 100 mg via ORAL
  Filled 2019-01-09 (×3): qty 1

## 2019-01-09 MED ORDER — DOCUSATE SODIUM 100 MG PO CAPS
100.0000 mg | ORAL_CAPSULE | Freq: Two times a day (BID) | ORAL | Status: DC
  Administered 2019-01-09 – 2019-01-12 (×3): 100 mg via ORAL
  Filled 2019-01-09 (×5): qty 1

## 2019-01-09 MED ORDER — ONDANSETRON HCL 4 MG PO TABS: 4.0000 mg | ORAL_TABLET | Freq: Four times a day (QID) | ORAL | Status: DC | PRN

## 2019-01-09 MED ORDER — SACUBITRIL-VALSARTAN 49-51 MG PO TABS
1.0000 | ORAL_TABLET | Freq: Every day | ORAL | Status: DC
  Administered 2019-01-10 – 2019-01-12 (×3): 1 via ORAL
  Filled 2019-01-09 (×3): qty 1

## 2019-01-09 MED ORDER — FUROSEMIDE 10 MG/ML IJ SOLN
20.0000 mg | Freq: Two times a day (BID) | INTRAMUSCULAR | Status: DC
  Administered 2019-01-09 – 2019-01-11 (×4): 20 mg via INTRAVENOUS
  Filled 2019-01-09 (×4): qty 2

## 2019-01-09 MED ORDER — INSULIN ASPART 100 UNIT/ML ~~LOC~~ SOLN
0.0000 [IU] | Freq: Three times a day (TID) | SUBCUTANEOUS | Status: DC
  Administered 2019-01-10: 2 [IU] via SUBCUTANEOUS
  Administered 2019-01-10: 1 [IU] via SUBCUTANEOUS
  Administered 2019-01-11: 2 [IU] via SUBCUTANEOUS
  Administered 2019-01-11 – 2019-01-12 (×2): 1 [IU] via SUBCUTANEOUS

## 2019-01-09 MED ORDER — ISOSORBIDE MONONITRATE ER 30 MG PO TB24
30.0000 mg | ORAL_TABLET | Freq: Every day | ORAL | Status: DC
  Administered 2019-01-09 – 2019-01-11 (×3): 30 mg via ORAL
  Filled 2019-01-09 (×3): qty 1

## 2019-01-09 MED ORDER — PREDNISONE 10 MG PO TABS: 10.0000 mg | ORAL_TABLET | Freq: Every day | ORAL | Status: DC

## 2019-01-09 MED ORDER — FUROSEMIDE 10 MG/ML IJ SOLN
20.0000 mg | Freq: Once | INTRAMUSCULAR | Status: AC
  Administered 2019-01-09: 20 mg via INTRAVENOUS
  Filled 2019-01-09: qty 2

## 2019-01-09 MED ORDER — ACETAMINOPHEN 650 MG RE SUPP: 650.0000 mg | Freq: Four times a day (QID) | RECTAL | Status: DC | PRN

## 2019-01-09 MED ORDER — HYDROCODONE-ACETAMINOPHEN 5-325 MG PO TABS
1.0000 | ORAL_TABLET | Freq: Four times a day (QID) | ORAL | Status: DC
  Administered 2019-01-10 – 2019-01-12 (×9): 1 via ORAL
  Filled 2019-01-09 (×10): qty 1

## 2019-01-09 MED ORDER — ACETAMINOPHEN 325 MG PO TABS: 650.0000 mg | ORAL_TABLET | Freq: Four times a day (QID) | ORAL | Status: DC | PRN

## 2019-01-09 MED ORDER — SODIUM CHLORIDE 0.9% FLUSH: 3.0000 mL | Freq: Once | INTRAVENOUS | Status: DC

## 2019-01-09 MED ORDER — OLOPATADINE HCL 0.1 % OP SOLN
1.0000 [drp] | Freq: Two times a day (BID) | OPHTHALMIC | Status: DC
  Administered 2019-01-09 – 2019-01-12 (×6): 1 [drp] via OPHTHALMIC
  Filled 2019-01-09: qty 5

## 2019-01-09 NOTE — ED Notes (Signed)
Patient transported to X-ray 

## 2019-01-09 NOTE — ED Notes (Signed)
Visitor 2 sent to Pt's Rm

## 2019-01-09 NOTE — ED Notes (Signed)
Family stated Pt unable to provide urine sample.

## 2019-01-09 NOTE — ED Notes (Signed)
RN sent 1 visitor  

## 2019-01-09 NOTE — H&P (Addendum)
History and Physical    [name] [name] YPP:[id number] DOB: [birth year] DOA: 01/09/2019  PCP: Helane Rima, DO Consultants:  Palliative care; [facility] - cardiology; [facility] - rheumatology; [facility] - orthopedics Patient coming from:  [facility] since last hospitalization, no discharge date is pending; NOK: Daughter, [name] [name], [phone]  Chief Complaint: Weakness  HPI: [name] [name] is a 85 y.o. female with medical history significant of HTN; DM; CVA; seizures; CAD; and CHF presenting with weakness.  Shortly after discharge to [facility], she went for rehab with the goal to help her stand and walk with the goal to proceed with surgery.  She has done fairly well but is still having a lot of back pain.  Her daughter took her to the spine specialist Thursday and she was ok.  Friday, she was weak and her color wasn't good - maybe tired from the day before.  Saturday was a bit better - spirits better, talking, but not up and about.  This AM, she had slurred speech, difficulty talking, cold/stiff, felt like her body was numb all over, they couldn't get her up to chair.  She was aware of everything last night and today is basically unresponsive.  She was "full of fever" - T 100.?, but subjective fever.  They have not been monitoring her Na++ there and she has gained 6-7 pounds.  +LE edema.  Her daughter has not noticed significant or persistent SOB.  Today, she had a cough that was new today, ?productive.  She has had increased confusion since last hospitalization.  She was hospitalized on 2/2 and discharged 2/11due to fever thought to be related to a RA flare; this was treated with prednisone and resumption of home DMARDs after initial antibiotics were discontinued.  She had stable severe L4-S1 stenosis.  She was euvolemic during this hospitalization, with known h/o chronic combined CHF.  She has a prior DVT/PE from 9/19 and Xarelto was continued.  ED Course:  Appears to have CHF - still waiting on  BNP, CXR with volume overload.  Troponin 0.04.  Flu negative.  Does not sound infectious.  Family reports weakness, no fevers.  Cultures pending.  No concern for sepsis.  Review of Systems: Unable to perform  Ambulatory Status:  Ambulates with a walker  Past Medical History:  Diagnosis Date  . Allergic rhinitis due to pollen 04/27/2007  . Arthritis    "in q joint" (08/07/2013)  . CHF (congestive heart failure) (HCC)   . Coronary atherosclerosis of native coronary artery   . Cough   . Disorder of bone and cartilage, unspecified   . Edema 05/03/2013  . Exertional shortness of breath    "sometimes" (08/07/2013)  . First degree atrioventricular block   . Gout, unspecified 04/19/2013  . Hyperlipidemia 04/27/2007  . Hypertension   . Midline low back pain without sciatica 06/27/2014  . Muscle spasm   . Muscle weakness (generalized)   . Myocardial infarction (HCC) ~ 1970  . Onychia and paronychia of toe   . Osteoarthrosis, unspecified whether generalized or localized, unspecified site 04/27/2007  . Other fall   . Other malaise and fatigue   . Pneumonia 05/2018  . Prepatellar bursitis   . Pulmonary embolism (HCC) 07/2018  . Rheumatic nodule 06/28/2017  . Seizures (HCC)   . Stroke (HCC) 01/17/2014  . TIA (transient ischemic attack)    "a few one summer" (08/07/2013)  . Type II diabetes mellitus (HCC)    "fasting 90-110s" (08/07/2013)  . Unspecified essential hypertension  08/08/2013  . Unspecified vitamin D deficiency     Past Surgical History:  Procedure Laterality Date  . ABDOMINAL HYSTERECTOMY  04/1980  . APPENDECTOMY  1960  . CARDIAC CATHETERIZATION  2003  . CATARACT EXTRACTION W/ INTRAOCULAR LENS  IMPLANT, BILATERAL Bilateral ~ 2012  . CHOLECYSTECTOMY N/A 06/26/2018   Procedure: LAPAROSCOPIC CHOLECYSTECTOMY POSSIBLE INTRAOPERATIVE CHOLANGIOGRAM;  Surgeon: Abigail Miyamoto, MD;  Location: [facility] OR;  Service: General;  Laterality: N/A;  . JOINT REPLACEMENT    . REPLACEMENT TOTAL  KNEE Right 09/2005  . RIGHT/LEFT HEART CATH AND CORONARY ANGIOGRAPHY N/A 06/01/2018   Procedure: RIGHT/LEFT HEART CATH AND CORONARY ANGIOGRAPHY;  Surgeon: Corky Crafts, MD;  Location: [facility] INVASIVE CV [facility];  Service: Cardiovascular;  Laterality: N/A;  . SHOULDER OPEN ROTATOR CUFF REPAIR Right 07/1999  . TONSILLECTOMY  08/1974  . TOTAL KNEE ARTHROPLASTY Left 08/07/2013   Procedure: TOTAL KNEE ARTHROPLASTY- LEFT;  Surgeon: Dannielle Huh, MD;  Location: [facility] OR;  Service: Orthopedics;  Laterality: Left;  . TRANSTHORACIC ECHOCARDIOGRAM  2003   EF 55-65%; mild concentric LVH    Social History   Socioeconomic History  . Marital status: Widowed    Spouse name: Not on file  . Number of children: 3  . Years of education: Not on file  . Highest education level: Not on file  Occupational History  . Not on file  Social Needs  . Financial resource strain: Not on file  . Food insecurity:    Worry: Not on file    Inability: Not on file  . Transportation needs:    Medical: Not on file    Non-medical: Not on file  Tobacco Use  . Smoking status: Never Smoker  . Smokeless tobacco: Never Used  Substance and Sexual Activity  . Alcohol use: No    Alcohol/week: 0.0 standard drinks  . Drug use: No  . Sexual activity: Never  Lifestyle  . Physical activity:    Days per week: Not on file    Minutes per session: Not on file  . Stress: Not on file  Relationships  . Social connections:    Talks on phone: Not on file    Gets together: Not on file    Attends religious service: Not on file    Active member of club or organization: Not on file    Attends meetings of clubs or organizations: Not on file    Relationship status: Not on file  . Intimate partner violence:    Fear of current or ex partner: Not on file    Emotionally abused: Not on file    Physically abused: Not on file    Forced sexual activity: Not on file  Other Topics Concern  . Not on file  Social History Narrative   Widowed    Walks with cane    Allergies  Allergen Reactions  . Clonidine Derivatives Swelling    [name]  . Fish Allergy Diarrhea, Swelling and Other (See Comments)    Turns skin "black," but can tolerate white fish Salmon- Diarrhea  . Shellfish Allergy Hives  . Doxycycline Rash  . Indomethacin Other (See Comments)    Reaction not recalled by the patient  . Lyrica [Pregabalin] Other (See Comments)    Hallucinations   . Methyldopa Other (See Comments)    Aldomet (for hypertension): Reaction not recalled by the patient  . Morphine And Related Other (See Comments)    Family reports it drops her  bp that she needs iv fluids  . Orange Fruit [Citrus] Other (See Comments)    Indigestion/heartburn  . Strawberry (Diagnostic) Itching  . Cetirizine Hcl Itching and Rash  . Codeine Itching  . Levaquin [Levofloxacin In D5w] Rash  . Tomato Rash    Family History  Problem Relation Age of Onset  . Heart attack Father     Prior to Admission medications   Medication Sig Start Date End Date Taking? Authorizing Provider  albuterol (PROVENTIL HFA;VENTOLIN HFA) 108 (90 Base) MCG/ACT inhaler Inhale 1-2 puffs into the lungs every 6 (six) hours as needed for wheezing or shortness of breath. 12/20/18   Laverna Peace, MD  allopurinol (ZYLOPRIM) 100 MG tablet Take 1 tablet (100 mg total) by mouth at bedtime. 12/20/18   Laverna Peace, MD  carvedilol (COREG) 25 MG tablet Take 1 tablet (25 mg total) by mouth 2 (two) times daily with a meal. 12/20/18   Roberto Scales D, MD  D3-1000 25 MCG (1000 UT) capsule Take 1 capsule (1,000 Units total) by mouth daily. 12/20/18   Roberto Scales D, MD  diclofenac sodium (VOLTAREN) 1 % GEL Apply 2 g topically daily as needed (pain). 12/20/18   Laverna Peace, MD  docusate sodium (COLACE) 100 MG capsule Take 1 capsule (100 mg total) by mouth daily as needed for mild constipation. 12/20/18   Laverna Peace, MD   ENSURE (ENSURE) Take 237 mLs by mouth every evening. 12/20/18   Roberto Scales D, MD  furosemide (LASIX) 20 MG tablet Take 1 tablet (20 mg total) by mouth daily. 12/20/18   Laverna Peace, MD  gabapentin (NEURONTIN) 400 MG capsule Take 1 capsule (400 mg total) by mouth 3 (three) times daily. 12/20/18   Roberto Scales D, MD  hydrALAZINE (APRESOLINE) 100 MG tablet Take 1 tablet (100 mg total) by mouth 3 (three) times daily. 12/20/18   Laverna Peace, MD  HYDROcodone-acetaminophen (NORCO/VICODIN) 5-325 MG tablet Take 1 tablet by mouth every 6 (six) hours. 12/22/18   Margit Hanks, MD  hydroxychloroquine (PLAQUENIL) 200 MG tablet Take 1 tablet (200 mg total) by mouth 2 (two) times daily. 12/20/18   Laverna Peace, MD  hydroxypropyl methylcellulose / hypromellose (ISOPTO TEARS / GONIOVISC) 2.5 % ophthalmic solution Place 1 drop into both eyes daily. 12/20/18   Laverna Peace, MD  isosorbide mononitrate (IMDUR) 30 MG 24 hr tablet Take 1 tablet (30 mg total) by mouth at bedtime. 12/20/18   Laverna Peace, MD  leflunomide (ARAVA) 20 MG tablet Take 1 tablet (20 mg total) by mouth daily. 12/20/18   Laverna Peace, MD  loratadine (CLARITIN) 10 MG tablet Take 1 tablet (10 mg total) by mouth daily. 12/21/18   Roberto Scales D, MD  Melatonin 3 MG TABS Take 1 tablet (3 mg total) by mouth at bedtime. 12/20/18   Roberto Scales D, MD  Olopatadine HCl 0.2 % SOLN Place 1 drop into both eyes daily. 12/20/18   Roberto Scales D, MD  predniSONE (DELTASONE) 10 MG tablet 2/12 start 20 mg x 3 days then resume 10 mg on 2/15 12/20/18   Roberto Scales D, MD  rivaroxaban (XARELTO) 20 MG TABS tablet Take 1 tablet (20 mg total) by mouth daily with supper. 12/20/18   Laverna Peace, MD  sacubitril-valsartan (ENTRESTO) 49-51 MG Take 1 tablet by mouth daily at 12 noon. 12/20/18   Roberto Scales D, MD  triamcinolone cream (KENALOG) 0.1 % Apply 1 application topically 2 (two) times  daily as needed (for itching- to affected sites).  12/20/18   Laverna Peace, MD  vitamin C (ASCORBIC ACID) 500 MG tablet Take 1 tablet (500 mg total) by mouth daily. 12/20/18   Laverna Peace, MD    Physical Exam: Vitals:   01/09/19 1130 01/09/19 1145 01/09/19 1200 01/09/19 1215  BP: (!) 160/96 (!) 180/88 (!) 185/86 (!) 161/85  Pulse: (!) 104 (!) 103 (!) 101 (!) 101  Resp: (!) 23 14 20  (!) 29  Temp:      TempSrc:      SpO2:      Weight:      Height:         . General: Obtunded.  Awakens briefly to loud voice, touch and then lapses back to sleep without answering questions . Eyes:  PERRL, normal lids, iris . ENT: normal lips & tongue, mmm . Neck:  no LAD, masses or thyromegaly; no carotid bruits . Cardiovascular:  RR with mild tachycardia, no m/r/g. No LE edema.  Marland Kitchen Respiratory:   CTA bilaterally with no wheezes/rales/rhonchi.  Normal respiratory effort. . Abdomen:  soft, NT, ND, NABS . Skin:  no rash or induration seen on limited exam . Musculoskeletal:  grossly normal tone BUE/BLE,  no bony abnormality . Psychiatric: obtunded, as described above . Neurologic: unable to perform    Radiological Exams on Admission: Dg Chest 2 View  Result Date: 01/09/2019 CLINICAL DATA:  Shortness of Breath EXAM: CHEST - 2 VIEW COMPARISON:  12/13/2018 FINDINGS: Cardiomegaly with vascular congestion. No confluent opacities or effusions. No overt edema. Degenerative changes in the shoulders. IMPRESSION: Cardiomegaly, vascular congestion. Electronically Signed   By: Charlett Nose [name]   On: 01/09/2019 12:41    EKG: Independently reviewed.  Sinus tachycardia with rate 106; nonspecific ST changes with no evidence of acute ischemia   Labs on Admission: I have personally reviewed the available labs and imaging studies at the time of the admission.  Pertinent labs:   CO2 20 Glucose 115 Albumin 2.9 Troponin 0.04 Lactate 0.8 WBC 7.5 Hgb 8.4; stable since 2/8 INR 2.0 Flu negative Blood cultures pending  Assessment/Plan Active Problems:    * No active hospital problems. * (Unable to update since the RN is using the chart)   AMS -Patient presenting with encephalopathy as evidenced by her obtunded/unresponsiveness -She has been progressively weaker since the weekend, according to her daughter -Evaluation thus far unremarkable other than AMS and mild tachycardia -Will place foley and check UA/culture -While initial concern was for infection in the setting of chronic immunosuppression, there is no evidence of infection at this time -Will hold immunosuppressants (other than stress-dosed steroids) at this time in case sepsis physiology declares itself  -Based on unremarkable evaluation with current ability to protect her airway, will admit on telemetry  -50% of patients with delirium while hospitalized will be institutionalized at 6 months, and these patients have a 25% mortality at 6 months -The family would benefit from being referred to the [facility] and also provided with the [employer] website -The patient does not sleep well; she may simply be exhausted in the setting of rehab and severe lumbar stenosis -Medications are also a consideration; sedating medications will be held at this time  Elevated troponin -No specific concerns for ACS based on history provided by the patient's daughter -EKG not overly concerning for ACS -Will monitor on telemetry and trend troponin q6h  Rheumatoid arthritis with recent flare requiring hospitalization -Wrist inflammation with hand  surgery involvement, thought to be  inflammatory arthritis  -Antibiotics were stopped, prednisone and home RA DMARD medications were resumed with resolution of fevers and improvement in clinical status. -Holding DMARD therapy for now, as above  Severe lumbar spinal stenosis of L4-L5, L5-S1.   -There is a plan for surgical repair of this issue, as the patient is continuing to have severe mobility issues at her SNF -This will obviously be on hold  for now -There is no obvious discrepancy in left vs. Right-sided weakness -Head CT is negative  Chronic combined systolic/diastolic heart failure.   -She has had dietary indiscretion at her facility -Her weight is actually down from when she was discharged on 2/11 -Mild volume overload on CXR but less so clinically -She was given Lasix 20 mg IV at home; will continue BID for now  -Continue Entresto, Coreg when awake enough to take PO  HTN   -Continue home hydralazine and Coreg -For now, will cover with IV prn hydralazine  DM   -Prior A1c 5.9, not on medications -Cover with sensitive-scale SSI  CKD, stage III.   -Stable -Repeat BMP in AM    DVT prophylaxis:  Xarelto Code Status:  Full - confirmed with family Family Communication: Daughter present throughout evaluation  Disposition Plan:  Back to SNF Consults called: None Admission status: Admit - It is my clinical opinion that admission to INPATIENT is reasonable and necessary because of the expectation that this patient will require hospital care that crosses at least 2 midnights to treat this condition based on the medical complexity of the problems presented.  Given the aforementioned information, the predictability of an adverse outcome is felt to be significant.    Jonah BlueJennifer Kipper Buch MD [facility]   How to contact the [facility] Attending or Consulting provider 7A - 7P or covering provider during after hours 7P -7A, for this patient?  1. Check the care team in [facility] and look for a) attending/consulting [facility] provider listed and b) the [facility] team listed 2. Log into [email or web address] and use [place]'s universal password to access. If you do not have the password, please contact the hospital operator. 3. Locate the [facility] provider you are looking for under [facility] and page to a number that you can be directly reached. 4. If you still have difficulty reaching the provider, please page the [facility] (Director on Call) for the  Hospitalists listed on amion for assistance.   01/09/2019, 2:25 PM

## 2019-01-09 NOTE — ED Triage Notes (Signed)
Arrived via EMS onset 1 weeks ago developed general weakness, productive cough, and fever today. Alert answering and following commands appropriate. Sent to ED for evaluation.

## 2019-01-09 NOTE — ED Notes (Signed)
ED TO INPATIENT HANDOFF REPORT  ED Nurse Name and Phone #: [phone]  S Name/Age/Gender [name] [name] 85 y.o. female Room/Bed: 022C/022C  Code Status   Code Status: Prior  Home/SNF/Other Skilled nursing facility Patient oriented to: self, place, time and situation Is this baseline? Yes   Triage Complete: Triage complete  Chief Complaint possible sepsis  Triage Note Arrived via EMS onset 1 weeks ago developed general weakness, productive cough, and fever today. Alert answering and following commands appropriate. Sent to ED for evaluation.    Allergies Allergies  Allergen Reactions  . Clonidine Derivatives Swelling    [name]  . Fish Allergy Diarrhea, Swelling and Other (See Comments)    Turns skin "black," but can tolerate white fish Salmon- Diarrhea  . Shellfish Allergy Hives  . Doxycycline Rash  . Indomethacin Other (See Comments)    Reaction not recalled by the patient  . Lyrica [Pregabalin] Other (See Comments)    Hallucinations   . Methyldopa Other (See Comments)    Aldomet (for hypertension): Reaction not recalled by the patient  . Morphine And Related Other (See Comments)    Family reports it drops her bp that she needs iv fluids  . Orange Fruit [Citrus] Other (See Comments)    Indigestion/heartburn  . Strawberry (Diagnostic) Itching  . Cetirizine Hcl Itching and Rash  . Codeine Itching  . Levaquin [Levofloxacin In D5w] Rash  . Tomato Rash    Level of Care/Admitting Diagnosis ED Disposition    ED Disposition Condition Comment   Admit  Hospital Area: [facility] [[id number]]  Level of Care: Medical Telemetry [104]  Diagnosis: AMS (altered mental status) [[id number]]  Admitting Physician: Jonah Blue [[id number]]  Attending Physician: Jonah Blue [[id number]]  Estimated length of stay: 3 - 4 days  Certification:: I certify this patient will need inpatient services  for at least 2 midnights  PT Class (Do Not Modify): Inpatient [101]  PT Acc Code (Do Not Modify): Private [1]       B Medical/Surgery History Past Medical History:  Diagnosis Date  . Allergic rhinitis due to pollen 04/27/2007  . Arthritis    "in q joint" (08/07/2013)  . CHF (congestive heart failure) (HCC)   . Coronary atherosclerosis of native coronary artery   . Cough   . Edema 05/03/2013  . Exertional shortness of breath    "sometimes" (08/07/2013)  . First degree atrioventricular block   . Gout, unspecified 04/19/2013  . Hyperlipidemia 04/27/2007  . Hypertension   . Midline low back pain without sciatica 06/27/2014  . Muscle spasm   . Muscle weakness (generalized)   . Onychia and paronychia of toe   . Osteoarthrosis, unspecified whether generalized or localized, unspecified site 04/27/2007  . Other fall   . Other malaise and fatigue   . Pneumonia 05/2018  . Prepatellar bursitis   . Pulmonary embolism (HCC) 07/2018  . Rheumatic nodule 06/28/2017  . Seizures (HCC)   . Stroke (HCC) 01/17/2014  . Type II diabetes mellitus (HCC)    "fasting 90-110s" (08/07/2013)  . Unspecified vitamin D deficiency    Past Surgical History:  Procedure Laterality Date  . ABDOMINAL HYSTERECTOMY  04/1980  . APPENDECTOMY  1960  . CARDIAC CATHETERIZATION  2003  . CATARACT EXTRACTION W/ INTRAOCULAR LENS  IMPLANT, BILATERAL Bilateral ~ 2012  . CHOLECYSTECTOMY N/A 06/26/2018   Procedure: LAPAROSCOPIC CHOLECYSTECTOMY POSSIBLE INTRAOPERATIVE CHOLANGIOGRAM;  Surgeon: Abigail Miyamoto, MD;  Location: [facility] OR;  Service: General;  Laterality: N/A;  . JOINT REPLACEMENT    . REPLACEMENT TOTAL KNEE Right 09/2005  . RIGHT/LEFT HEART CATH AND CORONARY ANGIOGRAPHY N/A 06/01/2018   Procedure: RIGHT/LEFT HEART CATH AND CORONARY ANGIOGRAPHY;  Surgeon: Corky Crafts, MD;  [facility] INV[facility] CV LAB;  Service: Cardiovascular;  Laterality: N/A;  . SHOULDER OPEN ROTATOR CUFF REPAIR Right 07/1999  .  TONSILLECTOMY  08/1974  . TOTAL KNEE ARTHROPLASTY Left 08/07/2013   Procedure: TOTAL KNEE ARTHROPLASTY- LEFT;  Surgeon: Dannielle Huh, MD;  Location: [facility] OR;  Service: Orthopedics;  Laterality: Left;  . TRANSTHORACIC ECHOCARDIOGRAM  2003   EF 55-65%; mild concentric LVH     A IV Location/Drains/Wounds Patient Lines/Drains/Airways Status   Active Line/Drains/Airways    Name:   Placement date:   Placement time:   Site:   Days:   Peripheral IV 01/09/19 Left Forearm   01/09/19    1102    Forearm   less than 1   Incision (Closed) 12/14/18 Hand Right   12/14/18    1945     26          Intake/Output Last 24 hours No intake or output data in the 24 hours ending 01/09/19 1616  Labs/Imaging Results for orders placed or performed during the hospital encounter of 01/09/19 (from the past 48 hour(s))  Culture, blood (Routine x 2)     Status: None (Preliminary result)   Collection Time: 01/09/19 11:02 AM  Result Value Ref Range   Specimen Description BLOOD BLOOD LEFT FOREARM    Special Requests      BOTTLES DRAWN AEROBIC AND ANAEROBIC Blood Culture adequate volume   Culture      NO GROWTH < 12 HOURS Performed at [facility], [address], [place], [place] [address]    Report Status PENDING   Culture, blood (Routine x 2)     Status: None (Preliminary result)   Collection Time: 01/09/19 11:07 AM  Result Value Ref Range   Specimen Description BLOOD BLOOD RIGHT HAND    Special Requests AEROBIC BOTTLE ONLY Blood Culture adequate volume    Culture      NO GROWTH < 12 HOURS Performed at [facility], [address], [place], [place] [address]    Report Status PENDING   Comprehensive metabolic panel     Status: Abnormal   Collection Time: 01/09/19 11:47 AM  Result Value Ref Range   Sodium 138 135 - 145 mmol/L   Potassium 3.6 3.5 - 5.1 mmol/L   Chloride 107 98 - 111 mmol/L   CO2 20 (L) 22 - 32 mmol/L   Glucose, Bld 115 (H) 70 - 99 mg/dL   BUN 22 8 - 23 mg/dL   Creatinine, Ser  7.42 0.44 - 1.00 mg/dL   Calcium 8.3 (L) 8.9 - 10.3 mg/dL   Total Protein 5.5 (L) 6.5 - 8.1 g/dL   Albumin 2.9 (L) 3.5 - 5.0 g/dL   AST 23 15 - 41 U/L   ALT 19 0 - 44 U/L   Alkaline Phosphatase 47 38 - 126 U/L   Total Bilirubin 0.8 0.3 - 1.2 mg/dL   GFR calc non Af Amer 52 (L) >60 mL/min   GFR calc Af Amer 60 (L) >60 mL/min   Anion gap 11 5 - 15    Comment: Performed at [facility], [address] [place], [place] [address]  Lactic acid, plasma     Status: None   Collection  Time: 01/09/19 11:47 AM  Result Value Ref Range   Lactic Acid, Venous 0.8 0.5 - 1.9 mmol/L    Comment: Performed at [facility], [address] [place], [place] [address]  CBC with Differential     Status: Abnormal   Collection Time: 01/09/19 11:47 AM  Result Value Ref Range   WBC 7.5 4.0 - 10.5 K/uL   RBC 2.73 (L) 3.87 - 5.11 MIL/uL   Hemoglobin 8.4 (L) 12.0 - 15.0 g/dL   HCT 60.426.8 (L) 54.036.0 - 98.146.0 %   MCV 98.2 80.0 - 100.0 fL   MCH 30.8 26.0 - 34.0 pg   MCHC 31.3 30.0 - 36.0 g/dL   RDW 19.121.6 (H) 47.811.5 - 29.515.5 %   Platelets 158 150 - 400 K/uL   nRBC 0.0 0.0 - 0.2 %   Neutrophils Relative % 80 %   Neutro Abs 6.0 1.7 - 7.7 K/uL   Lymphocytes Relative 12 %   Lymphs Abs 0.9 0.7 - 4.0 K/uL   Monocytes Relative 5 %   Monocytes Absolute 0.4 0.1 - 1.0 K/uL   Eosinophils Relative 3 %   Eosinophils Absolute 0.3 0.0 - 0.5 K/uL   Basophils Relative 0 %   Basophils Absolute 0.0 0.0 - 0.1 K/uL   Immature Granulocytes 0 %   Abs Immature Granulocytes 0.03 0.00 - 0.07 K/uL   Schistocytes PRESENT     Comment: Performed at [facility], [address] [place], [place] [address]  Protime-INR     Status: Abnormal   Collection Time: 01/09/19 11:47 AM  Result Value Ref Range   Prothrombin Time 22.6 (H) 11.4 - 15.2 seconds   INR 2.0 (H) 0.8 - 1.2    Comment: (NOTE) INR goal varies based on device and disease states. Performed at [facility], [address] [place], [place] [address]    Troponin I - Add-On to previous collection     Status: Abnormal   Collection Time: 01/09/19 11:47 AM  Result Value Ref Range   Troponin I 0.04 (HH) <0.03 ng/mL    Comment: CRITICAL RESULT CALLED TO, READ BACK BY AND VERIFIED WITH: Elzie RingsKIRK K, RN AT 1255 ON 01/09/2019 BY Carolyn StareSAINVILUS S Performed at [facility], [address] [place], [place] [address]   Influenza panel by PCR (type A & B)     Status: None   Collection Time: 01/09/19 11:55 AM  Result Value Ref Range   Influenza A By PCR NEGATIVE NEGATIVE   Influenza B By PCR NEGATIVE NEGATIVE    Comment: (NOTE) The Xpert Xpress Flu assay is intended as an aid in the diagnosis of  influenza and should not be used as a sole basis for treatment.  This  assay is FDA approved for nasopharyngeal swab specimens only. Nasal  washings and aspirates are unacceptable for Xpert Xpress Flu testing. Performed at [facility], [address] [place], [place] [address]   CBG monitoring, ED     Status: Abnormal   Collection Time: 01/09/19  3:04 PM  Result Value Ref Range   Glucose-Capillary 121 (H) 70 - 99 mg/dL   Dg Chest 2 View  Result Date: 01/09/2019 CLINICAL DATA:  Shortness of Breath EXAM: CHEST - 2 VIEW COMPARISON:  12/13/2018 FINDINGS: Cardiomegaly with vascular congestion. No confluent opacities or effusions. No overt edema. Degenerative changes in the shoulders. IMPRESSION: Cardiomegaly, vascular congestion. Electronically Signed   By: Charlett NoseKevin  Dover [name]   On: 01/09/2019 12:41  Pending Labs Unresulted Labs (From admission, onward)    Start     Ordered   01/09/19 1115  Urinalysis, Routine w reflex microscopic  ONCE - STAT,   STAT     01/09/19 1114   01/09/19 1113  Brain natriuretic peptide  Once,   STAT     01/09/19 1112   01/09/19 1102  Lactic acid, plasma  Now then every 2 hours,   STAT     01/09/19 1101   01/09/19 1102  Urinalysis, Routine w reflex microscopic  ONCE - STAT,   STAT     01/09/19 1101           Vitals/Pain Today's Vitals   01/09/19 1430 01/09/19 1445 01/09/19 1500 01/09/19 1515  BP: (!) 172/84 (!) 176/88 (!) 175/97 (!) 171/80  Pulse: 97 96 93 93  Resp: 17 15 (!) 23 16  Temp:      TempSrc:      SpO2:      Weight:      Height:      PainSc:        Isolation Precautions Droplet precaution  Medications Medications  sodium chloride flush (NS) 0.9 % injection 3 mL (has no administration in time range)  furosemide (LASIX) injection 20 mg (20 mg Intravenous Given 01/09/19 1436)    Mobility walks with device Moderate fall risk   Focused Assessments Neuro Assessment Handoff:   Cardiac Rhythm: Sinus tachycardia       , Pulmonary Assessment Handoff:  Lung sounds: Bilateral Breath Sounds: Diminished          R Recommendations: See Admitting Provider Note  Report given to:   Additional Notes: .

## 2019-01-09 NOTE — Progress Notes (Signed)
Admission note:  Arrival Method: Patient arrived on stretcher from ED accompanied by the daughter. Mental Orientation: Alert to self, lethargic, only responding to the name and going back to sleep. Telemetry: 5M11, NSR Assessment: See doc flow sheets. Skin: Dry, intact except blister on right heel and discoloration spots on both lower extremities, per daughter they are from rheumatoid arthritis. IV: Left FA NSL. Pain: no pain. Tubes: Foley 14 Fr draining clear light yellow urine. Safety Measures: Bed in low position, call bell and phone within reach. . Fall Prevention Safety Plan: Reviewed with the family and bed alarm initiated. Admission Screening: in process. 5100 Orientation: Patient has been oriented to the unit, staff and to the room.

## 2019-01-09 NOTE — ED Provider Notes (Signed)
[facility] EMERGENCY DEPARTMENT Provider Note   CSN: [id number] Arrival date & time: 01/09/19  1047    History   Chief Complaint Chief Complaint  Patient presents with  . Fever  . Cough  . Fatigue    HPI [name] [name] is a 85 y.o. female.     85 year old female with prior medical history as detailed below presents for evaluation of cough and report weakness.  Symptoms apparently became very bad this morning.  Patient is not the best historian.  Level 5 caveat secondary to same.  She denies associated chest pain.  She denies fever.  She reports increased edema to her lower extremities.  The history is provided by the patient and medical records.  Illness  Location:  Cough, weakness, lower extremity edema  Severity:  Moderate Onset quality:  Gradual Duration:  2 days Timing:  Constant Progression:  Worsening Chronicity:  New Associated symptoms: cough, fatigue and shortness of breath   Associated symptoms: no chest pain     Past Medical History:  Diagnosis Date  . Allergic rhinitis due to pollen 04/27/2007  . Arthritis    "in q joint" (08/07/2013)  . CHF (congestive heart failure) (HCC)   . Coronary atherosclerosis of native coronary artery   . Cough   . Disorder of bone and cartilage, unspecified   . Edema 05/03/2013  . Exertional shortness of breath    "sometimes" (08/07/2013)  . First degree atrioventricular block   . Gout, unspecified 04/19/2013  . Hyperlipidemia 04/27/2007  . Hypertension   . Midline low back pain without sciatica 06/27/2014  . Muscle spasm   . Muscle weakness (generalized)   . Myocardial infarction (HCC) ~ 1970  . Onychia and paronychia of toe   . Osteoarthrosis, unspecified whether generalized or localized, unspecified site 04/27/2007  . Other fall   . Other malaise and fatigue   . Pneumonia 05/2018  . Prepatellar bursitis   . Pulmonary embolism (HCC) 07/2018  . Rheumatic nodule 06/28/2017  . Seizures (HCC)   .  Stroke (HCC) 01/17/2014  . TIA (transient ischemic attack)    "a few one summer" (08/07/2013)  . Type II diabetes mellitus (HCC)    "fasting 90-110s" (08/07/2013)  . Unspecified essential hypertension 08/08/2013  . Unspecified vitamin D deficiency     Patient Active Problem List   Diagnosis Date Noted  . Rheumatoid arthritis flare (HCC) 12/23/2018  . Weakness of right lower extremity 12/23/2018  . Abscess of right upper extremity 12/14/2018  . Fever 12/13/2018  . Hand swelling 12/02/2018  . Pain, wrist 12/02/2018  . Hand pain 12/02/2018  . Polyarthritis 12/02/2018  . Spinal stenosis, lumbar 11/19/2018  . Acute encephalopathy 11/19/2018  . History of CVA (cerebrovascular accident) 11/19/2018  . Acute gout 11/15/2018  . Low back pain 11/14/2018  . Chronic back pain 10/27/2018  . Arthralgia of both knees 10/27/2018  . Rheumatoid arthritis involving both knees (HCC) 10/27/2018  . Acute pain 10/21/2018  . Physical deconditioning 09/06/2018  . Hyperlipidemia associated with type 2 diabetes mellitus (HCC), refuses statin 09/06/2018  . Refusal of statin medication by patient 09/04/2018  . Deep vein thrombosis (DVT) of distal vein of right lower extremity (HCC) 07/30/2018  . Hypertensive heart disease with heart failure (HCC) 07/30/2018  . Pulmonary embolism (HCC), 07/2018, on Xarelto with goal to DC after 6 months if more active 07/10/2018  . DCM (dilated cardiomyopathy) (HCC)   . Benign hypertensive heart and kidney disease with CHF and stage  3 chronic kidney disease (HCC) 05/28/2018  . Osteoarthritis 05/06/2018  . Hypokalemia 02/02/2018  . Adhesive capsulitis of right shoulder 10/12/2017  . Idiopathic chronic gout of multiple sites with tophus 08/25/2017  . History of total knee arthroplasty, bilateral 02/18/2017  . History of rotator cuff surgery 02/18/2017  . High risk medications, long-term use 09/21/2016  . Chronic combined systolic and diastolic CHF (congestive heart failure)  (HCC) 09/12/2015  . Bilateral edema of lower extremity 06/05/2014  . CKD stage 3 due to type 2 diabetes mellitus (HCC) 06/05/2014  . DM (diabetes mellitus), type 2 with renal complications (HCC) 06/05/2014    Class: Chronic  . Anemia of chronic disease 05/01/2014  . Rheumatoid arthritis involving multiple sites (HCC) 12/19/2013    Class: Chronic  . CAD (coronary artery disease) 10/18/2013  . Hypertension associated with diabetes (HCC) 08/08/2013    Class: Chronic  . Fibromyalgia 05/10/2013  . Type 2 diabetes mellitus with hyperlipidemia (HCC), patient declines statin 04/27/2007  . Allergic rhinitis 04/27/2007  . Diverticulosis 04/27/2007    Past Surgical History:  Procedure Laterality Date  . ABDOMINAL HYSTERECTOMY  04/1980  . APPENDECTOMY  1960  . CARDIAC CATHETERIZATION  2003  . CATARACT EXTRACTION W/ INTRAOCULAR LENS  IMPLANT, BILATERAL Bilateral ~ 2012  . CHOLECYSTECTOMY N/A 06/26/2018   Procedure: LAPAROSCOPIC CHOLECYSTECTOMY POSSIBLE INTRAOPERATIVE CHOLANGIOGRAM;  Surgeon: Abigail Miyamoto, MD;  Location: [facility] OR;  Service: General;  Laterality: N/A;  . JOINT REPLACEMENT    . REPLACEMENT TOTAL KNEE Right 09/2005  . RIGHT/LEFT HEART CATH AND CORONARY ANGIOGRAPHY N/A 06/01/2018   Procedure: RIGHT/LEFT HEART CATH AND CORONARY ANGIOGRAPHY;  Surgeon: Corky Crafts, MD;  Location: [facility] INVASIVE CV LAB;  Service: Cardiovascular;  Laterality: N/A;  . SHOULDER OPEN ROTATOR CUFF REPAIR Right 07/1999  . TONSILLECTOMY  08/1974  . TOTAL KNEE ARTHROPLASTY Left 08/07/2013   Procedure: TOTAL KNEE ARTHROPLASTY- LEFT;  Surgeon: Dannielle Huh, MD;  Location: [facility] OR;  Service: Orthopedics;  Laterality: Left;  . TRANSTHORACIC ECHOCARDIOGRAM  2003   EF 55-65%; mild concentric LVH     OB History   No obstetric history on file.      Home Medications    Prior to Admission medications   Medication Sig Start Date End Date Taking? Authorizing Provider  albuterol (PROVENTIL HFA;VENTOLIN HFA) 108  (90 Base) MCG/ACT inhaler Inhale 1-2 puffs into the lungs every 6 (six) hours as needed for wheezing or shortness of breath. 12/20/18   Laverna Peace, MD  allopurinol (ZYLOPRIM) 100 MG tablet Take 1 tablet (100 mg total) by mouth at bedtime. 12/20/18   Laverna Peace, MD  carvedilol (COREG) 25 MG tablet Take 1 tablet (25 mg total) by mouth 2 (two) times daily with a meal. 12/20/18   Roberto Scales D, MD  D3-1000 25 MCG (1000 UT) capsule Take 1 capsule (1,000 Units total) by mouth daily. 12/20/18   Roberto Scales D, MD  diclofenac sodium (VOLTAREN) 1 % GEL Apply 2 g topically daily as needed (pain). 12/20/18   Laverna Peace, MD  docusate sodium (COLACE) 100 MG capsule Take 1 capsule (100 mg total) by mouth daily as needed for mild constipation. 12/20/18   Laverna Peace, MD  ENSURE (ENSURE) Take 237 mLs by mouth every evening. 12/20/18   Roberto Scales D, MD  furosemide (LASIX) 20 MG tablet Take 1 tablet (20 mg total) by mouth daily. 12/20/18   Laverna Peace, MD  gabapentin (NEURONTIN) 400 MG capsule Take 1 capsule (400 mg total)  by mouth 3 (three) times daily. 12/20/18   Roberto Scales D, MD  hydrALAZINE (APRESOLINE) 100 MG tablet Take 1 tablet (100 mg total) by mouth 3 (three) times daily. 12/20/18   Laverna Peace, MD  HYDROcodone-acetaminophen (NORCO/VICODIN) 5-325 MG tablet Take 1 tablet by mouth every 6 (six) hours. 12/22/18   Margit Hanks, MD  hydroxychloroquine (PLAQUENIL) 200 MG tablet Take 1 tablet (200 mg total) by mouth 2 (two) times daily. 12/20/18   Laverna Peace, MD  hydroxypropyl methylcellulose / hypromellose (ISOPTO TEARS / GONIOVISC) 2.5 % ophthalmic solution Place 1 drop into both eyes daily. 12/20/18   Laverna Peace, MD  isosorbide mononitrate (IMDUR) 30 MG 24 hr tablet Take 1 tablet (30 mg total) by mouth at bedtime. 12/20/18   Laverna Peace, MD  leflunomide (ARAVA) 20 MG tablet Take 1 tablet (20 mg total) by mouth daily. 12/20/18   Laverna Peace, MD  loratadine  (CLARITIN) 10 MG tablet Take 1 tablet (10 mg total) by mouth daily. 12/21/18   Roberto Scales D, MD  Melatonin 3 MG TABS Take 1 tablet (3 mg total) by mouth at bedtime. 12/20/18   Roberto Scales D, MD  Olopatadine HCl 0.2 % SOLN Place 1 drop into both eyes daily. 12/20/18   Roberto Scales D, MD  predniSONE (DELTASONE) 10 MG tablet 2/12 start 20 mg x 3 days then resume 10 mg on 2/15 12/20/18   Roberto Scales D, MD  rivaroxaban (XARELTO) 20 MG TABS tablet Take 1 tablet (20 mg total) by mouth daily with supper. 12/20/18   Laverna Peace, MD  sacubitril-valsartan (ENTRESTO) 49-51 MG Take 1 tablet by mouth daily at 12 noon. 12/20/18   Roberto Scales D, MD  triamcinolone cream (KENALOG) 0.1 % Apply 1 application topically 2 (two) times daily as needed (for itching- to affected sites). 12/20/18   Laverna Peace, MD  vitamin C (ASCORBIC ACID) 500 MG tablet Take 1 tablet (500 mg total) by mouth daily. 12/20/18   Laverna Peace, MD    Family History Family History  Problem Relation Age of Onset  . Heart attack Father     Social History Social History   Tobacco Use  . Smoking status: Never Smoker  . Smokeless tobacco: Never Used  Substance Use Topics  . Alcohol use: No    Alcohol/week: 0.0 standard drinks  . Drug use: No     Allergies   Clonidine derivatives; Fish allergy; Shellfish allergy; Doxycycline; Indomethacin; Lyrica [pregabalin]; Methyldopa; Morphine and related; Orange fruit [citrus]; Strawberry (diagnostic); Cetirizine hcl; Codeine; Levaquin [levofloxacin in d5w]; and Tomato   Review of Systems Review of Systems  Constitutional: Positive for fatigue.  Respiratory: Positive for cough and shortness of breath.   Cardiovascular: Negative for chest pain.  All other systems reviewed and are negative.    Physical Exam Updated Vital Signs BP (!) 161/85   Pulse (!) 101   Temp 99.5 F (37.5 C) (Oral)   Resp (!) 29   Ht 5\' 4"  (1.626 m)   Wt 81.6 kg   SpO2 97%   BMI 30.90 kg/m     Physical Exam Vitals signs and nursing note reviewed.  Constitutional:      General: She is not in acute distress.    Appearance: She is well-developed.  HENT:     Head: Normocephalic and atraumatic.  Eyes:     Conjunctiva/sclera: Conjunctivae normal.     Pupils: Pupils are equal, round, and reactive to light.  Neck:     Musculoskeletal: Normal range of motion and neck supple.  Cardiovascular:     Rate and Rhythm: Normal rate and regular rhythm.     Heart sounds: Normal heart sounds.  Pulmonary:     Effort: Pulmonary effort is normal. No respiratory distress.     Breath sounds: Normal breath sounds.  Abdominal:     General: There is no distension.     Palpations: Abdomen is soft.     Tenderness: There is no abdominal tenderness.  Musculoskeletal: Normal range of motion.        General: No deformity.     Right lower leg: Edema present.     Left lower leg: Edema present.  Skin:    General: Skin is warm and dry.  Neurological:     Mental Status: She is alert and oriented to person, place, and time.      ED Treatments / Results  Labs (all labs ordered are listed, but only abnormal results are displayed) Labs Reviewed  COMPREHENSIVE METABOLIC PANEL - Abnormal; Notable for the following components:      Result Value   CO2 20 (*)    Glucose, Bld 115 (*)    Calcium 8.3 (*)    Total Protein 5.5 (*)    Albumin 2.9 (*)    GFR calc non Af Amer 52 (*)    GFR calc Af Amer 60 (*)    All other components within normal limits  CBC WITH DIFFERENTIAL/PLATELET - Abnormal; Notable for the following components:   RBC 2.73 (*)    Hemoglobin 8.4 (*)    HCT 26.8 (*)    RDW 21.6 (*)    All other components within normal limits  PROTIME-INR - Abnormal; Notable for the following components:   Prothrombin Time 22.6 (*)    INR 2.0 (*)    All other components within normal limits  TROPONIN I - Abnormal; Notable for the following components:   Troponin I 0.04 (*)    All other  components within normal limits  CULTURE, BLOOD (ROUTINE X 2)  CULTURE, BLOOD (ROUTINE X 2)  LACTIC ACID, PLASMA  INFLUENZA PANEL BY PCR (TYPE A & B)  LACTIC ACID, PLASMA  URINALYSIS, ROUTINE W REFLEX MICROSCOPIC  BRAIN NATRIURETIC PEPTIDE  URINALYSIS, ROUTINE W REFLEX MICROSCOPIC    EKG EKG Interpretation  Date/Time:  Monday January 09 2019 10:57:54 EST Ventricular Rate:  106 PR Interval:    QRS Duration: 85 QT Interval:  372 QTC Calculation: 494 R Axis:   -170 Text Interpretation:  Right and left arm electrode reversal, interpretation assumes no reversal Sinus tachycardia Probable lateral infarct, age indeterminate Confirmed by Kristine RoyalMessick, Anthonie Lotito [phone]([id number]) on 01/09/2019 11:13:42 AM   Radiology Dg Chest 2 View  Result Date: 01/09/2019 CLINICAL DATA:  Shortness of Breath EXAM: CHEST - 2 VIEW COMPARISON:  12/13/2018 FINDINGS: Cardiomegaly with vascular congestion. No confluent opacities or effusions. No overt edema. Degenerative changes in the shoulders. IMPRESSION: Cardiomegaly, vascular congestion. Electronically Signed   By: Charlett NoseKevin  Dover [name]   On: 01/09/2019 12:41    Procedures Procedures (including critical care time)  Medications Ordered in ED Medications  sodium chloride flush (NS) 0.9 % injection 3 mL (has no administration in time range)  furosemide (LASIX) injection 20 mg (has no administration in time range)     Initial Impression / Assessment and Plan / ED Course  I have reviewed the triage vital signs and the nursing notes.  Pertinent labs & imaging results that  were available during my care of the patient were reviewed by me and considered in my medical decision making (see chart for details).        MDM  Screen complete  Patient with cough, shortness of breath, lower extremity edema.  Symptoms are suggestive of likely CHF exacerbation.  Baseline labs obtained in the ED are on the whole without significant acute abnormality.  However, chest x-ray does  suggest pulmonary edema.  Patient likely would benefit from further inpatient work-up and treatment.  Hospitalist service Ophelia Charter) is aware of case will evaluate for admission.  Final Clinical Impressions(s) / ED Diagnoses   Final diagnoses:  Cough  Shortness of breath    ED Discharge Orders    None       Wynetta Fines, MD 01/09/19 [phone]

## 2019-01-10 ENCOUNTER — Inpatient Hospital Stay (HOSPITAL_COMMUNITY): Payer: Medicare Other

## 2019-01-10 DIAGNOSIS — R0602 Shortness of breath: Secondary | ICD-10-CM | POA: Diagnosis not present

## 2019-01-10 DIAGNOSIS — R29818 Other symptoms and signs involving the nervous system: Secondary | ICD-10-CM | POA: Diagnosis not present

## 2019-01-10 DIAGNOSIS — R400 Somnolence: Secondary | ICD-10-CM | POA: Diagnosis not present

## 2019-01-10 DIAGNOSIS — S91301A Unspecified open wound, right foot, initial encounter: Secondary | ICD-10-CM | POA: Diagnosis not present

## 2019-01-10 LAB — GLUCOSE, CAPILLARY
Glucose-Capillary: 132 mg/dL — ABNORMAL HIGH (ref 70–99)
Glucose-Capillary: 129 mg/dL — ABNORMAL HIGH (ref 70–99)
Glucose-Capillary: 168 mg/dL — ABNORMAL HIGH (ref 70–99)
Glucose-Capillary: 251 mg/dL — ABNORMAL HIGH (ref 70–99)

## 2019-01-10 LAB — CBC
HCT: 25.3 % — ABNORMAL LOW (ref 36.0–46.0)
Hemoglobin: 8.2 g/dL — ABNORMAL LOW (ref 12.0–15.0)
MCH: 30.9 pg (ref 26.0–34.0)
MCHC: 32.4 g/dL (ref 30.0–36.0)
MCV: 95.5 fL (ref 80.0–100.0)
Platelets: 163 10*3/uL (ref 150–400)
RBC: 2.65 MIL/uL — ABNORMAL LOW (ref 3.87–5.11)
RDW: 21.1 % — ABNORMAL HIGH (ref 11.5–15.5)
WBC: 5.8 10*3/uL (ref 4.0–10.5)
nRBC: 0 % (ref 0.0–0.2)

## 2019-01-10 LAB — BASIC METABOLIC PANEL
Anion gap: 9 (ref 5–15)
BUN: 22 mg/dL (ref 8–23)
CO2: 25 mmol/L (ref 22–32)
Calcium: 8.6 mg/dL — ABNORMAL LOW (ref 8.9–10.3)
Chloride: 107 mmol/L (ref 98–111)
Creatinine, Ser: 1.07 mg/dL — ABNORMAL HIGH (ref 0.44–1.00)
GFR calc Af Amer: 54 mL/min — ABNORMAL LOW (ref >60)
GFR calc non Af Amer: 47 mL/min — ABNORMAL LOW (ref >60)
Glucose, Bld: 138 mg/dL — ABNORMAL HIGH (ref 70–99)
Potassium: 3.2 mmol/L — ABNORMAL LOW (ref 3.5–5.1)
Sodium: 141 mmol/L (ref 135–145)

## 2019-01-10 LAB — TROPONIN I: Troponin I: 0.03 ng/mL (ref <0.03)

## 2019-01-10 MED ORDER — HYDROCORTISONE NA SUCCINATE PF 100 MG IJ SOLR: 50.0000 mg | Freq: Two times a day (BID) | INTRAMUSCULAR | Status: DC

## 2019-01-10 MED ORDER — POTASSIUM CHLORIDE CRYS ER 20 MEQ PO TBCR
40.0000 meq | EXTENDED_RELEASE_TABLET | ORAL | Status: AC
  Administered 2019-01-10 (×2): 40 meq via ORAL
  Filled 2019-01-10 (×2): qty 2

## 2019-01-10 MED ORDER — HYDROXYCHLOROQUINE SULFATE 200 MG PO TABS
200.0000 mg | ORAL_TABLET | Freq: Two times a day (BID) | ORAL | Status: DC
  Administered 2019-01-10 – 2019-01-12 (×4): 200 mg via ORAL
  Filled 2019-01-10 (×4): qty 1

## 2019-01-10 MED ORDER — PREDNISONE 10 MG PO TABS
10.0000 mg | ORAL_TABLET | Freq: Every day | ORAL | Status: DC
  Administered 2019-01-11 – 2019-01-12 (×2): 10 mg via ORAL
  Filled 2019-01-10 (×2): qty 1

## 2019-01-10 MED ORDER — LORAZEPAM 2 MG/ML IJ SOLN
0.5000 mg | Freq: Once | INTRAMUSCULAR | Status: AC | PRN
  Administered 2019-01-10: 0.5 mg via INTRAVENOUS
  Filled 2019-01-10: qty 1

## 2019-01-10 MED ORDER — LEFLUNOMIDE 20 MG PO TABS
20.0000 mg | ORAL_TABLET | Freq: Every day | ORAL | Status: DC
  Administered 2019-01-10 – 2019-01-12 (×3): 20 mg via ORAL
  Filled 2019-01-10 (×3): qty 1

## 2019-01-10 NOTE — Discharge Instructions (Signed)
Information on my medicine - XARELTO (rivaroxaban)  WHY WAS XARELTO PRESCRIBED FOR YOU? Xarelto was prescribed to treat blood clots from last year  What do you need to know about Xarelto? Continue taking one 20 mg tablet taken ONCE A DAY with your evening meal.  DO NOT stop taking Xarelto without talking to the health care provider who prescribed the medication.  Refill your prescription for 20 mg tablets before you run out.  After discharge, you should have regular check-up appointments with your healthcare provider that is prescribing your Xarelto.  In the future your dose may need to be changed if your kidney function changes by a significant amount.  What do you do if you miss a dose? If you are taking Xarelto TWICE DAILY and you miss a dose, take it as soon as you remember. You may take two 15 mg tablets (total 30 mg) at the same time then resume your regularly scheduled 15 mg twice daily the next day.  If you are taking Xarelto ONCE DAILY and you miss a dose, take it as soon as you remember on the same day then continue your regularly scheduled once daily regimen the next day. Do not take two doses of Xarelto at the same time.   Important Safety Information Xarelto is a blood thinner medicine that can cause bleeding. You should call your healthcare provider right away if you experience any of the following: ? Bleeding from an injury or your nose that does not stop. ? Unusual colored urine (red or dark brown) or unusual colored stools (red or black). ? Unusual bruising for unknown reasons. ? A serious fall or if you hit your head (even if there is no bleeding).  Some medicines may interact with Xarelto and might increase your risk of bleeding while on Xarelto. To help avoid this, consult your healthcare provider or pharmacist prior to using any new prescription or non-prescription medications, including herbals, vitamins, non-steroidal anti-inflammatory drugs (NSAIDs) and  supplements.  This website has more information on Xarelto: VisitDestination.com.br.

## 2019-01-10 NOTE — Consult Note (Addendum)
   [facility] CM Inpatient Consult   01/10/2019  TASHUNDA LINEBARGER 02/08/1930 [id number]  Patient screened for extreme high risk score for unplanned readmission  and hospitalizations to check if potential [facility] Care Management services are needed.  Patient was from [facility] and returned with Altered Mental Status.   Came by to see patient and she was in nursing care.  This Clinical research associate waited but will follow up at a more appropriate time.  01/11/2019 0920 am:  Patient is for skilled nursing per notes. No community follow up needs noted at this time.  Please place a [facility] Care Management consult or for questions contact:   Charlesetta Shanks, RN BSN CCM [facility]  [phone] business mobile phone Toll free office [phone]

## 2019-01-10 NOTE — Consult Note (Signed)
WOC Nurse wound consult note Reason for Consult: Consult requested for right heel, family states "it has been there for awhile" Wound type: Right posterior heel with dark red fluid billed blister; deep tissue pressure injury Pressure Injury POA: Yes Measurement: 3X5cm Wound bed: intact skin over the affected area, no open wound or drainage Dressing procedure/placement/frequency: Discussed with family members that deep tissue pressure injuries are high risk to evolve into full thickness tissue loss within the next 7-10 days.  Float heel to reduce pressure in Prevalon boot.  Foam dressing to protect from further injury. Please re-consult if further assistance is needed.  Thank-you,  Cammie Mcgee MSN, RN, CWOCN, [place], CNS [phone]

## 2019-01-10 NOTE — Clinical Social Work Note (Signed)
Clinical Social Work Assessment  Patient Details  Name: [name] [name] MRN: [id number] Date of Birth: 1930/07/14  Date of referral:  01/10/19               Reason for consult:  Discharge Planning                Permission sought to share information with:  Case Manager, Facility Medical sales representative Permission granted to share information::  Yes, Verbal Permission Granted  Name::     Hampton,Jeannette  Agency::  [facility]  Relationship::  daughter  Contact Information:  [phone]  Housing/Transportation Living arrangements for the past 2 months:  Skilled Nursing Facility, Independent Living Facility Source of Information:  Patient Patient Interpreter Needed:  None Criminal Activity/Legal Involvement Pertinent to Current Situation/Hospitalization:  No - Comment as needed Significant Relationships:  Adult Children, Other(Comment), Other Family Members(faith community) Lives with:  Self, Facility Resident Do you feel safe going back to the place where you live?  Yes Need for family participation in patient care:  Yes (Comment)  Care giving concerns:  Pt states that she has been living at [facility] for the last 4 weeks getting rehabiliation. States that she wants to return to her independent living apartment which has a handicap bathroom. She also has a wheelchair and rollator.   Social Worker assessment / plan:  Patient states that she would like to return to [facility] to continue her rehab. She stated that she felt safe in their care. Spoke with [facility] who stated that they would accept her back, however, [facility] advised that patient had been recently cut from rehab services. CSW spoke with daughter about SNF placement. Daughter, Para March ([place]) toured [facility] and chose this facility for patient's continued care instead of having patient return to previous facility. This Clinical research associate spoke with [place] today who offered bed after receiving initial referral and clinical  information, and initiated authorization with insurance. Spoke with patient about going to different facility. She stated that her daughter had told her. Patient was accepting of the change.   Employment status:  Retired Database administrator, Medicaid In [place] PT Recommendations:   SNF Information / Referral to community resources:  Skilled Nursing Facility  Patient/Family's Response to care:  No concerns expressed by patient or family about hospital care  Patient/Family's Understanding of and Emotional Response to Diagnosis, Current Treatment, and Prognosis:  Accepting  Emotional Assessment Appearance:  Appears stated age Attitude/Demeanor/Rapport:  Engaged, Gracious Affect (typically observed):  Accepting Orientation:  Oriented to Self, Oriented to Place, Oriented to  Time, Oriented to Situation Alcohol / Substance use:  Never Used Psych involvement (Current and /or in the community):  No (Comment)  Discharge Needs  Concerns to be addressed:  Discharge Planning Concerns Readmission within the last 30 days:  Yes Current discharge risk:  Chronically ill, Physical Impairment Barriers to Discharge:  Continued Medical Work up   Bess Kinds, RN 01/10/2019, 4:39 PM

## 2019-01-10 NOTE — [place] FL2 (Signed)
[place] MEDICAID FL2 LEVEL OF CARE SCREENING TOOL     IDENTIFICATION  Patient Name: [name] [name] Birthdate: 07-15-1930 Sex: female Admission Date (Current Location): 01/09/2019  [place]  [place] [id number] Facility and Address:  [facility] [place], [address], [place], [place] [address]      Provider Number: [id number]  Attending Physician Name and Address:  Zigmund DanielPowell, [name] Jr., *  Relative Name and Phone Number:  [name] [phone]    Current Level of Care: Hospital Recommended Level of Care: Skilled Nursing Facility Prior Approval Number:    Date Approved/Denied:   PASRR Number: [id number]  Discharge Plan: SNF    Current Diagnoses: Patient Active Problem List   Diagnosis Date Noted  . AMS (altered mental status) 01/09/2019  . Rheumatoid arthritis flare (HCC) 12/23/2018  . Weakness of right lower extremity 12/23/2018  . Abscess of right upper extremity 12/14/2018  . Fever 12/13/2018  . Hand swelling 12/02/2018  . Pain, wrist 12/02/2018  . Hand pain 12/02/2018  . Polyarthritis 12/02/2018  . Spinal stenosis, lumbar 11/19/2018  . Acute encephalopathy 11/19/2018  . History of CVA (cerebrovascular accident) 11/19/2018  . Acute gout 11/15/2018  . Low back pain 11/14/2018  . Chronic back pain 10/27/2018  . Arthralgia of both knees 10/27/2018  . Rheumatoid arthritis involving both knees (HCC) 10/27/2018  . Acute pain 10/21/2018  . Physical deconditioning 09/06/2018  . Hyperlipidemia associated with type 2 diabetes mellitus (HCC), refuses statin 09/06/2018  . Refusal of statin medication by patient 09/04/2018  . Deep vein thrombosis (DVT) of distal vein of right lower extremity (HCC) 07/30/2018  . Hypertensive heart disease with heart failure (HCC) 07/30/2018  . Pulmonary embolism (HCC), 07/2018, on Xarelto with goal to DC after 6 months if more active 07/10/2018  . DCM (dilated cardiomyopathy) (HCC)   . Benign  hypertensive heart and kidney disease with CHF and stage 3 chronic kidney disease (HCC) 05/28/2018  . Osteoarthritis 05/06/2018  . Hypokalemia 02/02/2018  . Adhesive capsulitis of right shoulder 10/12/2017  . Idiopathic chronic gout of multiple sites with tophus 08/25/2017  . History of total knee arthroplasty, bilateral 02/18/2017  . History of rotator cuff surgery 02/18/2017  . High risk medications, long-term use 09/21/2016  . Chronic combined systolic and diastolic CHF (congestive heart failure) (HCC) 09/12/2015  . Bilateral edema of lower extremity 06/05/2014  . CKD stage 3 due to type 2 diabetes mellitus (HCC) 06/05/2014  . DM (diabetes mellitus), type 2 with renal complications (HCC) 06/05/2014    Class: Chronic  . Anemia of chronic disease 05/01/2014  . Rheumatoid arthritis involving multiple sites (HCC) 12/19/2013    Class: Chronic  . CAD (coronary artery disease) 10/18/2013  . Hypertension associated with diabetes (HCC) 08/08/2013    Class: Chronic  . Fibromyalgia 05/10/2013  . Type 2 diabetes mellitus with hyperlipidemia (HCC), patient declines statin 04/27/2007  . Allergic rhinitis 04/27/2007  . Diverticulosis 04/27/2007    Orientation RESPIRATION BLADDER Height & Weight     Self, Time, Situation, Place   clear, diminished, 18 Incontinent Weight: 86.6 kg Height:  5\' 4"  (162.6 cm)  BEHAVIORAL SYMPTOMS/MOOD NEUROLOGICAL  BOWEL NUTRITION STATUS      Alert and oriented x 4-forgetful Incontinent Diet-low sodium heart healthy  AMBULATORY STATUS  COMMUNICATION OF NEEDS Skin     Transfers with 2 assist  Verbally Other (Comment)(blister to R/L lower legs-deep tissue injury to R heel with foam dressing)  Personal Care Assistance Level of Assistance              Functional Limitations Info             SPECIAL CARE FACTORS FREQUENCY                       Contractures Contractures Info: Not present    Additional Factors Info   Code Status, Allergies Code Status Info: Full Allergies Info: clonidine derivatives, fish, shellfish, doxycycline, indomethacin, lyrica (pregabalin), methyldopa, morphine and related, orange fruit (citrus), strawberry, cetirizine hcl, codeine, levaquin (levofloxacin in D5W), tomato           Current Medications (01/10/2019):  This is the current hospital active medication list Current Facility-Administered Medications  Medication Dose Route Frequency Provider Last Rate Last Dose  . acetaminophen (TYLENOL) tablet 650 mg  650 mg Oral Q6H PRN Jonah Blue, MD       Or  . acetaminophen (TYLENOL) suppository 650 mg  650 mg Rectal Q6H PRN Jonah Blue, MD      . allopurinol (ZYLOPRIM) tablet 100 mg  100 mg Oral Noemi Chapel, MD   100 mg at 01/09/19 2154  . carvedilol (COREG) tablet 25 mg  25 mg Oral BID WC Jonah Blue, MD   25 mg at 01/10/19 1020  . docusate sodium (COLACE) capsule 100 mg  100 mg Oral BID Jonah Blue, MD   100 mg at 01/09/19 2154  . furosemide (LASIX) injection 20 mg  20 mg Intravenous BID Jonah Blue, MD   20 mg at 01/10/19 1020  . hydrALAZINE (APRESOLINE) injection 5 mg  5 mg Intravenous Q4H PRN Jonah Blue, MD      . hydrALAZINE (APRESOLINE) tablet 100 mg  100 mg Oral TID Jonah Blue, MD   100 mg at 01/10/19 1019  . HYDROcodone-acetaminophen (NORCO/VICODIN) 5-325 MG per tablet 1 tablet  1 tablet Oral Q6H Jonah Blue, MD   1 tablet at 01/10/19 0608  . hydrocortisone sodium succinate (SOLU-CORTEF) 100 MG injection 50 mg  50 mg Intravenous Q6H Jonah Blue, MD   50 mg at 01/10/19 1416  . insulin aspart (novoLOG) injection 0-9 Units  0-9 Units Subcutaneous TID WC Jonah Blue, MD   1 Units at 01/10/19 0820  . isosorbide mononitrate (IMDUR) 24 hr tablet 30 mg  30 mg Oral QHS Jonah Blue, MD   30 mg at 01/09/19 2155  . loratadine (CLARITIN) tablet 10 mg  10 mg Oral Daily Jonah Blue, MD   10 mg at 01/10/19 1020  . olopatadine  (PATANOL) 0.1 % ophthalmic solution 1 drop  1 drop Both Eyes BID Jonah Blue, MD   1 drop at 01/10/19 1416  . ondansetron (ZOFRAN) tablet 4 mg  4 mg Oral Q6H PRN Jonah Blue, MD       Or  . ondansetron [facility]) injection 4 mg  4 mg Intravenous Q6H PRN Jonah Blue, MD      . rivaroxaban Carlena Hurl) tablet 20 mg  20 mg Oral Q supper Jonah Blue, MD      . sacubitril-valsartan [facility]) 49-51 mg per tablet  1 tablet Oral Q1200 Jonah Blue, MD   1 tablet at 01/10/19 1416  . sodium chloride flush (NS) 0.9 % injection 3 mL  3 mL Intravenous Once Jonah Blue, MD      . sodium chloride flush (NS) 0.9 % injection 3 mL  3 mL Intravenous Q12H Jonah Blue, MD   3 mL at 01/10/19 1026  Discharge Medications: Please see discharge summary for a list of discharge medications.  Relevant Imaging Results:  Relevant Lab Results:   Additional Information SSN: [id number]  Bess Kinds, RN

## 2019-01-10 NOTE — Progress Notes (Addendum)
PROGRESS NOTE    [name] [name]  IBB:[id number] DOB: Jul 06, 1930 DOA: 01/09/2019 PCP: [name] Rima, DO   Brief Narrative:  [name] [name] is [name] [name] 85 y.o. female with medical history significant of HTN; DM; CVA; seizures; CAD; and CHF presenting with weakness.  Shortly after discharge to [facility], she went for rehab with the goal to help her stand and walk with the goal to proceed with surgery.  She has done fairly well but is still having [name] [name] lot of back pain.  Her daughter took her to the spine specialist Thursday and she was ok.  Friday, she was weak and her color wasn't good - maybe tired from the day before.  Saturday was [name] [name] bit better - spirits better, talking, but not up and about.  This AM, she had slurred speech, difficulty talking, cold/stiff, felt like her body was numb all over, they couldn't get her up to chair.  She was aware of everything last night and today is basically unresponsive.  She was "full of fever" - T 100.?, but subjective fever.  They have not been monitoring her Na++ there and she has gained 6-7 pounds.  +LE edema.  Her daughter has not noticed significant or persistent SOB.  Today, she had [name] [name] cough that was new today, ?productive.  She has had increased confusion since last hospitalization.  She was hospitalized on 2/2 and discharged 2/11due to fever thought to be related to [name] [name] RA flare; this was treated with prednisone and resumption of home DMARDs after initial antibiotics were discontinued.  She had stable severe L4-S1 stenosis.  She was euvolemic during this hospitalization, with known h/o chronic combined CHF.  She has [name] [name] prior DVT/PE from 9/19 and Xarelto was continued.  Assessment & Plan:   Active Problems:   AMS (altered mental status)   AMS - Obtunded on presentation - Unclear etiology, ? Narcotics, TIA? - UA not suggestive of infection - follow culture - follow blood cultures - CXR with vascular congestion, cardiomegally - Given concern for right sided  weakness/numbness, will order MRI - Will also follow R foot plain films given heel wound  - She was transitioned to high dose steroids, will resume home steroid regiment  - Initially concern for infection in setting of immunosuppression, but back to baseline today, will resume home arava/plaquenil -The family would benefit from being referred to the [facility] and also provided with the [employer] website - narcotics currently continued.  Consider holding these if she becomes sedated.  Gabapentin on hold.  - ? TIA with description of R sided numbness/weakness (MRI pending as noted above)  Elevated troponin -No specific concerns for ACS based on history provided by the patient's daughter -EKG appears like some lead placement issues, will repeat -troponin low and flat -follow outpatient  Rheumatoid arthritis with recent flare requiring hospitalization -Wrist inflammation with hand surgery involvement, thought to be  inflammatory arthritis  -Antibiotics were stopped, prednisone and home RA DMARD medications were resumed with resolution of fevers and improvement in clinical status.  Severe lumbar spinal stenosis of L4-L5, L5-S1.  -There is [name] [name] plan for surgical repair of this issue, as the patient is continuing to have severe mobility issues at her SNF - needs continued outpatient follow up  Chronic combined systolic/diastolic heart failure.  - She has had dietary indiscretion at her facility - Mild volume overload on CXR but less so clinically - Continue lasix 20 IV BID - I/O, daily weights  -Continue Entresto,  Coreg   Wt Readings from Last 3 Encounters:  01/09/19 86.6 kg  12/21/18 83.9 kg  12/18/18 84.3 kg    HTN -Continue home hydralazine and Coreg -For now, will cover with IV prn hydralazine  DM -Prior A1c 5.9 (06/2018) -Cover with sensitive-scale SSI  CKD, stage III.  -Stable -Repeat BMP in AM  Hypokalemia:  - follow and replace  R heel  wound: wound c/s  DVT prophylaxis: xarelto Code Status: full  Family Communication: daughter at bedside Disposition Plan: pending improvement   Consultants:   none  Procedures:   none  Antimicrobials: Anti-infectives (From admission, onward)   Start     Dose/Rate Route Frequency Ordered Stop   01/10/19 2200  hydroxychloroquine (PLAQUENIL) tablet 200 mg     200 mg Oral 2 times daily 01/10/19 1856       Subjective: Feels better now. Yesterday, could use hand.  Felt voice slurring. Doesn't remember things from then Numb on R side? Maybe after medication. She can't move like she wants.  Objective: Vitals:   01/09/19 1715 01/09/19 2004 01/10/19 0438 01/10/19 0834  BP: (!) 168/77 (!) 152/74 (!) 158/76 131/76  Pulse: 94 95 81 91  Resp: 20 (!) 21 20 18   Temp: 98.2 F (36.8 C) 98.8 F (37.1 C) 97.8 F (36.6 C) 98.4 F (36.9 C)  TempSrc: Oral Oral Oral Oral  SpO2: 97% 94% 100% 98%  Weight:  86.6 kg    Height:        Intake/Output Summary (Last 24 hours) at 01/10/2019 1813 Last data filed at 01/10/2019 1500 Gross per 24 hour  Intake 900 ml  Output 2250 ml  Net -1350 ml   Filed Weights   01/09/19 1059 01/09/19 2004  Weight: 81.6 kg 86.6 kg    Examination:  General exam: Appears calm and comfortable  Respiratory system: Clear to auscultation. Respiratory effort normal. Cardiovascular system: S1 & S2 heard, RRR. Gastrointestinal system: Abdomen is nondistended, soft and nontender. Central nervous system: Alert and oriented. No focal neurological deficits. Skin: No rashes, lesions or ulcers Psychiatry: Judgement and insight appear normal. Mood & affect appropriate.     Data Reviewed: I have personally reviewed following labs and imaging studies  CBC: Recent Labs  Lab 01/09/19 1147 01/10/19 0429  WBC 7.5 5.8  NEUTROABS 6.0  --   HGB 8.4* 8.2*  HCT 26.8* 25.3*  MCV 98.2 95.5  PLT 158 163   Basic Metabolic Panel: Recent Labs  Lab 01/09/19 1147  01/10/19 0429  NA 138 141  K 3.6 3.2*  CL 107 107  CO2 20* 25  GLUCOSE 115* 138*  BUN 22 22  CREATININE 0.99 1.07*  CALCIUM 8.3* 8.6*   GFR: Estimated Creatinine Clearance: 40.2 mL/min (Jossilyn Benda) (by C-G formula based on SCr of 1.07 mg/dL (H)). Liver Function Tests: Recent Labs  Lab 01/09/19 1147  AST 23  ALT 19  ALKPHOS 47  BILITOT 0.8  PROT 5.5*  ALBUMIN 2.9*   No results for input(s): LIPASE, AMYLASE in the last 168 hours. No results for input(s): AMMONIA in the last 168 hours. Coagulation Profile: Recent Labs  Lab 01/09/19 1147  INR 2.0*   Cardiac Enzymes: Recent Labs  Lab 01/09/19 1147 01/10/19 1012  TROPONINI 0.04* 0.03*   BNP (last 3 results) No results for input(s): PROBNP in the last 8760 hours. HbA1C: No results for input(s): HGBA1C in the last 72 hours. CBG: Recent Labs  Lab 01/09/19 1831 01/09/19 2007 01/10/19 1655 01/10/19 1224 01/10/19 1638  GLUCAP 93 99 132* 129* 168*   Lipid Profile: No results for input(s): CHOL, HDL, LDLCALC, TRIG, CHOLHDL, LDLDIRECT in the last 72 hours. Thyroid Function Tests: No results for input(s): TSH, T4TOTAL, FREET4, T3FREE, THYROIDAB in the last 72 hours. Anemia Panel: No results for input(s): VITAMINB12, FOLATE, FERRITIN, TIBC, IRON, RETICCTPCT in the last 72 hours. Sepsis Labs: Recent Labs  Lab 01/09/19 1147 01/09/19 1657  LATICACIDVEN 0.8 0.8    Recent Results (from the past 240 hour(s))  Culture, blood (Routine x 2)     Status: None (Preliminary result)   Collection Time: 01/09/19 11:02 AM  Result Value Ref Range Status   Specimen Description BLOOD BLOOD LEFT FOREARM  Final   Special Requests   Final    BOTTLES DRAWN AEROBIC AND ANAEROBIC Blood Culture adequate volume   Culture   Final    NO GROWTH 1 DAY Performed at [facility], [address] [place], [place] [address]    Report Status PENDING  Incomplete  Culture, blood (Routine x 2)     Status: None (Preliminary result)   Collection  Time: 01/09/19 11:07 AM  Result Value Ref Range Status   Specimen Description BLOOD BLOOD RIGHT HAND  Final   Special Requests AEROBIC BOTTLE ONLY Blood Culture adequate volume  Final   Culture   Final    NO GROWTH 1 DAY Performed at [facility], [address] [place], [place] [address]    Report Status PENDING  Incomplete  MRSA PCR Screening     Status: None   Collection Time: 01/09/19  7:34 PM  Result Value Ref Range Status   MRSA by PCR NEGATIVE NEGATIVE Final    Comment:        The GeneXpert MRSA Assay (FDA approved for NASAL specimens only), is one component of Hailynn Slovacek comprehensive MRSA colonization surveillance program. It is not intended to diagnose MRSA infection nor to guide or monitor treatment for MRSA infections. Performed at [facility], [address], [place], [place] [address]          Radiology Studies: Dg Chest 2 View  Result Date: 01/09/2019 CLINICAL DATA:  Shortness of Breath EXAM: CHEST - 2 VIEW COMPARISON:  12/13/2018 FINDINGS: Cardiomegaly with vascular congestion. No confluent opacities or effusions. No overt edema. Degenerative changes in the shoulders. IMPRESSION: Cardiomegaly, vascular congestion. Electronically Signed   By: Charlett Nose [name]   On: 01/09/2019 12:41   Ct Head Wo Contrast  Result Date: 01/09/2019 CLINICAL DATA:  Generalized weakness for 1 week. Productive cough and fever today. EXAM: CT HEAD WITHOUT CONTRAST TECHNIQUE: Contiguous axial images were obtained from the base of the skull through the vertex without intravenous contrast. COMPARISON:  CT head 07/23/2018. FINDINGS: Brain: There is no evidence of acute intracranial hemorrhage, mass lesion, brain edema or extra-axial fluid collection. The ventricles and subarachnoid spaces are appropriately sized for age. There is no CT evidence of acute cortical infarction. There is stable patchy low-density in the periventricular white matter, likely due to chronic small vessel ischemic  changes. Vascular: Intracranial vascular calcifications. No hyperdense vessel identified. Skull: Negative for fracture or focal lesion. Sinuses/Orbits: Mild frontal, ethmoid and left sphenoid sinus mucosal thickening without air-fluid levels. The maxillary sinuses are clear. The mastoid air cells and middle ears are clear. No significant orbital findings. Other: None. IMPRESSION: 1. Stable appearance of the brain without acute intracranial findings. 2. Stable mild chronic small vessel ischemic changes. 3. New mild mucosal thickening throughout  the paranasal sinuses. Electronically Signed   By: Carey BullocksWilliam  Veazey [name]   On: 01/09/2019 16:29   Mr Brain Wo Contrast  Result Date: 01/10/2019 CLINICAL DATA:  85 y/o F; Focal neuro deficit, > 6 hrs, stroke suspected, weakness. EXAM: MRI HEAD WITHOUT CONTRAST TECHNIQUE: Multiplanar, multiecho pulse sequences of the brain and surrounding structures were obtained without intravenous contrast. COMPARISON:  01/09/2019 CT head.  06/25/2019 MRI head. FINDINGS: Brain: No acute infarction, hemorrhage, hydrocephalus, extra-axial collection or mass lesion. Early confluent nonspecific T2 FLAIR hyperintensities in subcortical and periventricular white matter are compatible with moderate chronic microvascular ischemic changes. Moderate volume loss of the brain. Very small chronic infarctions within the right inferior cerebellar hemisphere and left hemi pons. Vascular: Normal flow voids. Skull and upper cervical spine: Normal marrow signal. Sinuses/Orbits: Mild mucosal thickening of frontal and anterior ethmoid air cells. No significant abnormal signal of mastoid air cells. Bilateral intra-ocular lens replacement. Other: None. IMPRESSION: 1. No acute intracranial abnormality identified. 2. Moderate chronic microvascular ischemic changes and volume loss of the brain. Electronically Signed   By: Mitzi HansenLance  Furusawa-Stratton [name]   On: 01/10/2019 13:50   Dg Foot 2 Views Right  Result Date:  01/10/2019 CLINICAL DATA:  Open wound on the right heel. EXAM: RIGHT FOOT - 2 VIEW COMPARISON:  None. FINDINGS: No fracture or bone lesion. There is no bone resorption to suggest osteomyelitis. Skeletal structures are demineralized. There is mild joint space narrowing at the first metatarsophalangeal joint. Remaining joints are normally spaced and aligned. Small to moderate plantar and small dorsal calcaneal spurs. No soft tissue air. IMPRESSION: 1. No fracture bone lesion or evidence of osteomyelitis. 2. Calcaneal spurs.  No soft tissue air. Electronically Signed   By: Amie Portlandavid  Ormond [name]   On: 01/10/2019 11:10        Scheduled Meds: . allopurinol  100 mg Oral QHS  . carvedilol  25 mg Oral BID WC  . docusate sodium  100 mg Oral BID  . furosemide  20 mg Intravenous BID  . hydrALAZINE  100 mg Oral TID  . HYDROcodone-acetaminophen  1 tablet Oral Q6H  . [START ON 01/11/2019] hydrocortisone sod succinate (SOLU-CORTEF) inj  50 mg Intravenous Q12H  . insulin aspart  0-9 Units Subcutaneous TID WC  . isosorbide mononitrate  30 mg Oral QHS  . loratadine  10 mg Oral Daily  . olopatadine  1 drop Both Eyes BID  . rivaroxaban  20 mg Oral Q supper  . sacubitril-valsartan  1 tablet Oral Q1200  . sodium chloride flush  3 mL Intravenous Once  . sodium chloride flush  3 mL Intravenous Q12H   Continuous Infusions:   LOS: 1 day    Time spent: over 30 min    Lacretia Nicksaldwell Powell, MD [facility] Pager AMION  If 7PM-7AM, please contact night-coverage [email or web address] Password [email or web address] 01/10/2019, 6:13 PM

## 2019-01-11 DIAGNOSIS — R0602 Shortness of breath: Secondary | ICD-10-CM | POA: Diagnosis not present

## 2019-01-11 DIAGNOSIS — I5032 Chronic diastolic (congestive) heart failure: Secondary | ICD-10-CM | POA: Diagnosis not present

## 2019-01-11 DIAGNOSIS — D649 Anemia, unspecified: Secondary | ICD-10-CM | POA: Diagnosis not present

## 2019-01-11 DIAGNOSIS — I509 Heart failure, unspecified: Secondary | ICD-10-CM | POA: Diagnosis not present

## 2019-01-11 DIAGNOSIS — R400 Somnolence: Secondary | ICD-10-CM | POA: Diagnosis not present

## 2019-01-11 DIAGNOSIS — E876 Hypokalemia: Secondary | ICD-10-CM | POA: Diagnosis not present

## 2019-01-11 DIAGNOSIS — E1169 Type 2 diabetes mellitus with other specified complication: Secondary | ICD-10-CM

## 2019-01-11 LAB — RETICULOCYTES
Immature Retic Fract: 11.7 % (ref 2.3–15.9)
RBC.: 2.81 MIL/uL — ABNORMAL LOW (ref 3.87–5.11)
Retic Count, Absolute: 81.2 10*3/uL (ref 19.0–186.0)
Retic Ct Pct: 2.9 % (ref 0.4–3.1)

## 2019-01-11 LAB — URINE CULTURE: Culture: NO GROWTH

## 2019-01-11 LAB — COMPREHENSIVE METABOLIC PANEL
ALT: 15 U/L (ref 0–44)
AST: 16 U/L (ref 15–41)
Albumin: 2.7 g/dL — ABNORMAL LOW (ref 3.5–5.0)
Alkaline Phosphatase: 39 U/L (ref 38–126)
Anion gap: 9 (ref 5–15)
BUN: 28 mg/dL — ABNORMAL HIGH (ref 8–23)
CO2: 25 mmol/L (ref 22–32)
Calcium: 8.3 mg/dL — ABNORMAL LOW (ref 8.9–10.3)
Chloride: 107 mmol/L (ref 98–111)
Creatinine, Ser: 1.15 mg/dL — ABNORMAL HIGH (ref 0.44–1.00)
GFR calc Af Amer: 50 mL/min — ABNORMAL LOW (ref >60)
GFR calc non Af Amer: 43 mL/min — ABNORMAL LOW (ref >60)
Glucose, Bld: 145 mg/dL — ABNORMAL HIGH (ref 70–99)
Potassium: 3.8 mmol/L (ref 3.5–5.1)
Sodium: 141 mmol/L (ref 135–145)
Total Bilirubin: 0.4 mg/dL (ref 0.3–1.2)
Total Protein: 5.4 g/dL — ABNORMAL LOW (ref 6.5–8.1)

## 2019-01-11 LAB — IRON AND TIBC
Iron: 35 ug/dL (ref 28–170)
Saturation Ratios: 14 % (ref 10.4–31.8)
TIBC: 249 ug/dL — ABNORMAL LOW (ref 250–450)
UIBC: 214 ug/dL

## 2019-01-11 LAB — CBC
HCT: 24.4 % — ABNORMAL LOW (ref 36.0–46.0)
Hemoglobin: 7.7 g/dL — ABNORMAL LOW (ref 12.0–15.0)
MCH: 30.2 pg (ref 26.0–34.0)
MCHC: 31.6 g/dL (ref 30.0–36.0)
MCV: 95.7 fL (ref 80.0–100.0)
Platelets: 180 10*3/uL (ref 150–400)
RBC: 2.55 MIL/uL — ABNORMAL LOW (ref 3.87–5.11)
RDW: 21 % — ABNORMAL HIGH (ref 11.5–15.5)
WBC: 7.4 10*3/uL (ref 4.0–10.5)
nRBC: 0 % (ref 0.0–0.2)

## 2019-01-11 LAB — GLUCOSE, CAPILLARY
Glucose-Capillary: 123 mg/dL — ABNORMAL HIGH (ref 70–99)
Glucose-Capillary: 81 mg/dL (ref 70–99)
Glucose-Capillary: 170 mg/dL — ABNORMAL HIGH (ref 70–99)
Glucose-Capillary: 136 mg/dL — ABNORMAL HIGH (ref 70–99)

## 2019-01-11 LAB — FERRITIN: Ferritin: 304 ng/mL (ref 11–307)

## 2019-01-11 LAB — OCCULT BLOOD X 1 CARD TO LAB, STOOL: Fecal Occult Bld: NEGATIVE

## 2019-01-11 LAB — MAGNESIUM: Magnesium: 2 mg/dL (ref 1.7–2.4)

## 2019-01-11 LAB — FOLATE: Folate: 14.2 ng/mL (ref >5.9)

## 2019-01-11 LAB — VITAMIN B12: Vitamin B-12: 631 pg/mL (ref 180–914)

## 2019-01-11 MED ORDER — FUROSEMIDE 20 MG PO TABS
20.0000 mg | ORAL_TABLET | Freq: Every day | ORAL | Status: DC
  Administered 2019-01-11 – 2019-01-12 (×2): 20 mg via ORAL
  Filled 2019-01-11 (×2): qty 1

## 2019-01-11 NOTE — Progress Notes (Signed)
PROGRESS NOTE    [name] [name]  ZOX:[id number] DOB: [birth year] DOA: 01/09/2019 PCP: Helane Rima, DO   Brief Narrative:  HPI on 01/09/2019 by Dr. Jonah Blue [name] [name] is a 85 y.o. female with medical history significant of HTN; DM; CVA; seizures; CAD; and CHF presenting with weakness.  Shortly after discharge to [facility], she went for rehab with the goal to help her stand and walk with the goal to proceed with surgery.  She has done fairly well but is still having a lot of back pain.  Her daughter took her to the spine specialist Thursday and she was ok.  Friday, she was weak and her color wasn't good - maybe tired from the day before.  Saturday was a bit better - spirits better, talking, but not up and about.  This AM, she had slurred speech, difficulty talking, cold/stiff, felt like her body was numb all over, they couldn't get her up to chair.  She was aware of everything last night and today is basically unresponsive.  She was "full of fever" - T 100.?, but subjective fever.  They have not been monitoring her Na++ there and she has gained 6-7 pounds.  +LE edema.  Her daughter has not noticed significant or persistent SOB.  Today, she had a cough that was new today, ?productive.  She has had increased confusion since last hospitalization.  She was hospitalized on 2/2 and discharged 2/11due to fever thought to be related to a RA flare; this was treated with prednisone and resumption of home DMARDs after initial antibiotics were discontinued.  She had stable severe L4-S1 stenosis.  She was euvolemic during this hospitalization, with known h/o chronic combined CHF.  She has a prior DVT/PE from 9/19 and Xarelto was continued.  Interim history Patient was admitted with altered mental status.  Thus far work-up has been unremarkable.  Pending PT and OT evaluations recommendations.  Suspect SNF at discharge. Assessment & Plan   Acute encephalopathy, unclear etiology -Resolved, patient  currently alert and oriented x3 -Patient was obtunded on presentation, etiology unclear at this time. -UA as well as chest x-ray unremarkable for infection -Blood cultures no growth to date -CT head: stable appearance of the brain without acute intracranial findings  -MRI brain: no acute intracranial abnormality identified   Elevated troponin -Currently no complaints of chest pain -Troponin has been flat  Rheumatoid arthritis with recent flare requiring hospitalization -Wrist inflammation with hand surgery involvement, thought to be inflammatory arthritis -Patient was placed on antibiotics earlier these were discontinued -Continue DMARD, prednisone  Severe lumbar spinal stenosis -Of L4-5, L5-S1 -There is a plan for surgical repair of this issue, as patient is continuing to have severe mobility issues at her SNF -Will need to further outpatient follow-up  Chronic combined systolic and diastolic heart failure -Patient had dietary indiscretion at her facility -Patient had mild volume overload on chest x-ray however clinically she appears to be euvolemic and compensated -Continue Lasix 20 mg IV BID - will place back on home dose -Monitor intake and output, daily weights -Continue Coreg, Entresto  Essential hypertension -Continue hydralazine and coreg  Diabetes mellitus, type II -Last hemoglobin A1c was 5.9 in August 2019 -Continue insulin sliding scale with CBG monitoring  Chronic kidney disease, stage III -Creatinine appears to be stable, continue to monitor BMP  Hypokalemia -Replaced, continue to monitor BMP  Right heel wound -Present on admission -Wound care consulted and appreciated continue float heel to reduce pressure and Prevalon boot.  Foam dressing to protect further injury  Chronic normocytic anemia -Hemoglobin currently 7.7, appears to be close to her baseline from last month however has been higher in the past -will obtain anemia panel, patient has had low iron  in the past -Continue to monitor CBC  DVT Prophylaxis  Xarelto  Code Status: Full  Family Communication: None at bedside  Disposition Plan: Observation. Pending PT/OT. Dispo TBD  Consultants None  Procedures  None  Antibiotics   Anti-infectives (From admission, onward)   Start     Dose/Rate Route Frequency Ordered Stop   01/10/19 2200  hydroxychloroquine (PLAQUENIL) tablet 200 mg     200 mg Oral 2 times daily 01/10/19 1856        Subjective:   [name] [name] seen and examined today.  Patient continues to feel somewhat weak.  Denies current chest pain, shortness breath, abdominal pain, nausea or vomiting, diarrhea constipation, dizziness or headache.  Objective:   Vitals:   01/10/19 1700 01/10/19 2039 01/11/19 0342 01/11/19 0816  BP: (!) 149/72 130/77 129/69 (!) 149/81  Pulse: 94 96 79 91  Resp: 18 19 17 18   Temp: 98.6 F (37 C) 98.8 F (37.1 C) 97.8 F (36.6 C) (!) 97.3 F (36.3 C)  TempSrc: Oral Oral Oral Oral  SpO2:  100% 100% 100%  Weight:  86.4 kg    Height:        Intake/Output Summary (Last 24 hours) at 01/11/2019 1223 Last data filed at 01/11/2019 0900 Gross per 24 hour  Intake 1380 ml  Output 1375 ml  Net 5 ml   Filed Weights   01/09/19 1059 01/09/19 2004 01/10/19 2039  Weight: 81.6 kg 86.6 kg 86.4 kg    Exam  General: Well developed, well nourished, NAD, appears stated age  HEENT: NCAT, mucous membranes moist.   Neck: Supple  Cardiovascular: S1 S2 auscultated, no murmur, RRR  Respiratory: Clear to auscultation bilaterally with equal chest rise  Abdomen: Soft, obese, nontender, nondistended, + bowel sounds  Extremities: warm dry without cyanosis clubbing. Mild LE edema  Neuro: AAOx3, nonfocal  Psych: Appropriate mood and affect   Data Reviewed: I have personally reviewed following labs and imaging studies  CBC: Recent Labs  Lab 01/09/19 1147 01/10/19 0429 01/11/19 0536  WBC 7.5 5.8 7.4  NEUTROABS 6.0  --   --   HGB 8.4*  8.2* 7.7*  HCT 26.8* 25.3* 24.4*  MCV 98.2 95.5 95.7  PLT 158 163 180   Basic Metabolic Panel: Recent Labs  Lab 01/09/19 1147 01/10/19 0429 01/11/19 0536  NA 138 141 141  K 3.6 3.2* 3.8  CL 107 107 107  CO2 20* 25 25  GLUCOSE 115* 138* 145*  BUN 22 22 28*  CREATININE 0.99 1.07* 1.15*  CALCIUM 8.3* 8.6* 8.3*  MG  --   --  2.0   GFR: Estimated Creatinine Clearance: 37.4 mL/min (A) (by C-G formula based on SCr of 1.15 mg/dL (H)). Liver Function Tests: Recent Labs  Lab 01/09/19 1147 01/11/19 0536  AST 23 16  ALT 19 15  ALKPHOS 47 39  BILITOT 0.8 0.4  PROT 5.5* 5.4*  ALBUMIN 2.9* 2.7*   No results for input(s): LIPASE, AMYLASE in the last 168 hours. No results for input(s): AMMONIA in the last 168 hours. Coagulation Profile: Recent Labs  Lab 01/09/19 1147  INR 2.0*   Cardiac Enzymes: Recent Labs  Lab 01/09/19 1147 01/10/19 1012  TROPONINI 0.04* 0.03*   BNP (last 3 results) No results for input(s):  PROBNP in the last 8760 hours. HbA1C: No results for input(s): HGBA1C in the last 72 hours. CBG: Recent Labs  Lab 01/10/19 1224 01/10/19 1638 01/10/19 2138 01/11/19 0730 01/11/19 1122  GLUCAP 129* 168* 251* 123* 81   Lipid Profile: No results for input(s): CHOL, HDL, LDLCALC, TRIG, CHOLHDL, LDLDIRECT in the last 72 hours. Thyroid Function Tests: No results for input(s): TSH, T4TOTAL, FREET4, T3FREE, THYROIDAB in the last 72 hours. Anemia Panel: No results for input(s): VITAMINB12, FOLATE, FERRITIN, TIBC, IRON, RETICCTPCT in the last 72 hours. Urine analysis:    Component Value Date/Time   COLORURINE STRAW (A) 01/09/2019 1758   APPEARANCEUR CLEAR 01/09/2019 1758   LABSPEC 1.004 (L) 01/09/2019 1758   PHURINE 7.0 01/09/2019 1758   GLUCOSEU NEGATIVE 01/09/2019 1758   HGBUR NEGATIVE 01/09/2019 1758   BILIRUBINUR NEGATIVE 01/09/2019 1758   BILIRUBINUR Negative 01/13/2017 1234   KETONESUR NEGATIVE 01/09/2019 1758   PROTEINUR NEGATIVE 01/09/2019 1758    UROBILINOGEN 0.2 01/13/2017 1234   UROBILINOGEN 1.0 09/11/2015 0135   NITRITE NEGATIVE 01/09/2019 1758   LEUKOCYTESUR NEGATIVE 01/09/2019 1758   Sepsis Labs: @LABRCNTIP (procalcitonin:4,lacticidven:4)  ) Recent Results (from the past 240 hour(s))  Culture, blood (Routine x 2)     Status: None (Preliminary result)   Collection Time: 01/09/19 11:02 AM  Result Value Ref Range Status   Specimen Description BLOOD BLOOD LEFT FOREARM  Final   Special Requests   Final    BOTTLES DRAWN AEROBIC AND ANAEROBIC Blood Culture adequate volume   Culture   Final    NO GROWTH 2 DAYS Performed at [facility], [address] [place], [place] [address]    Report Status PENDING  Incomplete  Culture, blood (Routine x 2)     Status: None (Preliminary result)   Collection Time: 01/09/19 11:07 AM  Result Value Ref Range Status   Specimen Description BLOOD BLOOD RIGHT HAND  Final   Special Requests AEROBIC BOTTLE ONLY Blood Culture adequate volume  Final   Culture   Final    NO GROWTH 2 DAYS Performed at [facility], [address] [place], [place] [address]    Report Status PENDING  Incomplete  Culture, Urine     Status: None   Collection Time: 01/09/19  6:36 PM  Result Value Ref Range Status   Specimen Description URINE, CATHETERIZED  Final   Special Requests NONE  Final   Culture   Final    NO GROWTH Performed at [facility], [address] [place], [place] [address]    Report Status 01/11/2019 FINAL  Final  MRSA PCR Screening     Status: None   Collection Time: 01/09/19  7:34 PM  Result Value Ref Range Status   MRSA by PCR NEGATIVE NEGATIVE Final    Comment:        The GeneXpert MRSA Assay (FDA approved for NASAL specimens only), is one component of a comprehensive MRSA colonization surveillance program. It is not intended to diagnose MRSA infection nor to guide or monitor treatment for MRSA infections. Performed at [facility], [address]  [place], [place] [address]       Radiology Studies: Dg Chest 2 View  Result Date: 01/09/2019 CLINICAL DATA:  Shortness of Breath EXAM: CHEST - 2 VIEW COMPARISON:  12/13/2018 FINDINGS: Cardiomegaly with vascular congestion. No confluent opacities or effusions. No overt edema. Degenerative changes in the shoulders. IMPRESSION: Cardiomegaly, vascular congestion. Electronically Signed   By: Charlett Nose  [name]   On: 01/09/2019 12:41   Ct Head Wo Contrast  Result Date: 01/09/2019 CLINICAL DATA:  Generalized weakness for 1 week. Productive cough and fever today. EXAM: CT HEAD WITHOUT CONTRAST TECHNIQUE: Contiguous axial images were obtained from the base of the skull through the vertex without intravenous contrast. COMPARISON:  CT head 07/23/2018. FINDINGS: Brain: There is no evidence of acute intracranial hemorrhage, mass lesion, brain edema or extra-axial fluid collection. The ventricles and subarachnoid spaces are appropriately sized for age. There is no CT evidence of acute cortical infarction. There is stable patchy low-density in the periventricular white matter, likely due to chronic small vessel ischemic changes. Vascular: Intracranial vascular calcifications. No hyperdense vessel identified. Skull: Negative for fracture or focal lesion. Sinuses/Orbits: Mild frontal, ethmoid and left sphenoid sinus mucosal thickening without air-fluid levels. The maxillary sinuses are clear. The mastoid air cells and middle ears are clear. No significant orbital findings. Other: None. IMPRESSION: 1. Stable appearance of the brain without acute intracranial findings. 2. Stable mild chronic small vessel ischemic changes. 3. New mild mucosal thickening throughout the paranasal sinuses. Electronically Signed   By: Carey Bullocks [name]   On: 01/09/2019 16:29   Mr Brain Wo Contrast  Result Date: 01/10/2019 CLINICAL DATA:  85 y/o F; Focal neuro deficit, > 6 hrs, stroke suspected, weakness. EXAM: MRI HEAD WITHOUT CONTRAST TECHNIQUE:  Multiplanar, multiecho pulse sequences of the brain and surrounding structures were obtained without intravenous contrast. COMPARISON:  01/09/2019 CT head.  06/25/2019 MRI head. FINDINGS: Brain: No acute infarction, hemorrhage, hydrocephalus, extra-axial collection or mass lesion. Early confluent nonspecific T2 FLAIR hyperintensities in subcortical and periventricular white matter are compatible with moderate chronic microvascular ischemic changes. Moderate volume loss of the brain. Very small chronic infarctions within the right inferior cerebellar hemisphere and left hemi pons. Vascular: Normal flow voids. Skull and upper cervical spine: Normal marrow signal. Sinuses/Orbits: Mild mucosal thickening of frontal and anterior ethmoid air cells. No significant abnormal signal of mastoid air cells. Bilateral intra-ocular lens replacement. Other: None. IMPRESSION: 1. No acute intracranial abnormality identified. 2. Moderate chronic microvascular ischemic changes and volume loss of the brain. Electronically Signed   By: Mitzi Hansen [name]   On: 01/10/2019 13:50   Dg Foot 2 Views Right  Result Date: 01/10/2019 CLINICAL DATA:  Open wound on the right heel. EXAM: RIGHT FOOT - 2 VIEW COMPARISON:  None. FINDINGS: No fracture or bone lesion. There is no bone resorption to suggest osteomyelitis. Skeletal structures are demineralized. There is mild joint space narrowing at the first metatarsophalangeal joint. Remaining joints are normally spaced and aligned. Small to moderate plantar and small dorsal calcaneal spurs. No soft tissue air. IMPRESSION: 1. No fracture bone lesion or evidence of osteomyelitis. 2. Calcaneal spurs.  No soft tissue air. Electronically Signed   By: Amie Portland [name]   On: 01/10/2019 11:10     Scheduled Meds: . allopurinol  100 mg Oral QHS  . carvedilol  25 mg Oral BID WC  . docusate sodium  100 mg Oral BID  . furosemide  20 mg Intravenous BID  . hydrALAZINE  100 mg Oral TID  .  HYDROcodone-acetaminophen  1 tablet Oral Q6H  . hydroxychloroquine  200 mg Oral BID  . insulin aspart  0-9 Units Subcutaneous TID WC  . isosorbide mononitrate  30 mg Oral QHS  . leflunomide  20 mg Oral Daily  . loratadine  10 mg Oral Daily  . olopatadine  1 drop Both Eyes BID  .  predniSONE  10 mg Oral Daily  . rivaroxaban  20 mg Oral Q supper  . sacubitril-valsartan  1 tablet Oral Q1200  . sodium chloride flush  3 mL Intravenous Once  . sodium chloride flush  3 mL Intravenous Q12H   Continuous Infusions:   LOS: 2 days   Time Spent in minutes   30 minutes  Detrich Rakestraw D.O. on 01/11/2019 at 12:23 PM  Between 7am to 7pm - Please see pager noted on [email or web address]  After 7pm go to [email or web address]  And look for the night coverage person covering for me after hours  [facility] Office  [phone]

## 2019-01-11 NOTE — Evaluation (Signed)
Physical Therapy Evaluation Patient Details Name: [name] [name] MRN: [id number] DOB: 1930-09-19 Today's Date: 01/11/2019   History of Present Illness  Pt is an 85 y/o female with 6 prior admissions in the last 6 months. She presents from SNF with AMS and R sided numbness and weakness. Noted from last admission pt had R side weakness (subacute at that time), and MRI was negative for acute intracranial changes this admission. PMH significant for RA, gout, DM II, diastolic CHF, PE on Xarelto, CVA, HTN, CAD, low back pain (DDD on MRI).  Clinical Impression  Pt admitted with above diagnosis. Pt currently with functional limitations due to the deficits listed below (see PT Problem List). At the time of PT eval pt was able to perform transfers with +2 assist for balance support and safety. PTA pt reports she had progressed with therapy at SNF to taking "a couple steps" but is still at a transfers only level with nursing staff. Pt is currently appropriate for transfers either with +2 staff assist, or use of Stedy. Pt will benefit from skilled PT to increase their independence and safety with mobility to allow discharge to the venue listed below.       Follow Up Recommendations SNF    Equipment Recommendations  None recommended by PT    Recommendations for Other Services       Precautions / Restrictions Precautions Precautions: Fall Restrictions Weight Bearing Restrictions: No      Mobility  Bed Mobility Overal bed mobility: Needs Assistance Bed Mobility: Supine to Sit     Supine to sit: Min guard;HOB elevated     General bed mobility comments: Pt was able to transition to EOB with increased time and use of rails, however no assistance required.   Transfers Overall transfer level: Needs assistance Equipment used: 2 person hand held assist Transfers: Sit to/from [employer] Sit to Stand: Mod assist;+2 physical assistance;From elevated surface Stand pivot transfers:  Mod assist;+2 physical assistance;From elevated surface       General transfer comment: +2 mod assist for basic sit>stand transfer. Pt was able to demonstrate some pre-gait activity of marching in place (x2 on each side). Pt required a heavier +2 mod assist to take pivotal steps around to the recliner chair.   Ambulation/Gait             General Gait Details: Unable to progress to gait training at this time.   Stairs            Wheelchair Mobility    Modified Rankin (Stroke Patients Only)       Balance Overall balance assessment: Needs assistance Sitting-balance support: Feet supported;Bilateral upper extremity supported Sitting balance-Leahy Scale: Fair Sitting balance - Comments: able to sit EOB without UE or PT support. Pt sat EOB ~5 minutes without difficulty   Standing balance support: Bilateral upper extremity supported Standing balance-Leahy Scale: Poor Standing balance comment: very heavily reliant on UE support on RW and assist from therapist.                              Pertinent Vitals/Pain Pain Assessment: Faces Faces Pain Scale: No hurt Pain Intervention(s): Monitored during session    Home Living Family/patient expects to be discharged to:: Skilled nursing facility                      Prior Function Level of Independence: Needs assistance   Gait /  Transfers Assistance Needed: Pt states she had gotten to the place where she could take a few steps with therapy, but otherwise was at a transfers only level.  ADL's / Homemaking Assistance Needed: Staff assisting with ADL's at SNF        Hand Dominance   Dominant Hand: Right    Extremity/Trunk Assessment   Upper Extremity Assessment Upper Extremity Assessment: Defer to OT evaluation    Lower Extremity Assessment Lower Extremity Assessment: Generalized weakness RLE Deficits / Details: Pt able to flex knee up in supine with heel slide. Note wound on R heel covered with  pink foam and in prevalon boot. Pt reports sensation is equal R and L and "back to normal"    Cervical / Trunk Assessment Cervical / Trunk Assessment: Other exceptions Cervical / Trunk Exceptions: Forward head posture with rounded shoulders  Communication   Communication: No difficulties  Cognition Arousal/Alertness: Awake/alert Behavior During Therapy: WFL for tasks assessed/performed Overall Cognitive Status: Within Functional Limits for tasks assessed                   Orientation Level: (Oriented x4)             General Comments: Not specifically tested, but conversation is normal and pt is alert and cooperative.      General Comments      Exercises     Assessment/Plan    PT Assessment Patient needs continued PT services  PT Problem List Decreased strength;Cardiopulmonary status limiting activity;Decreased cognition;Decreased activity tolerance;Decreased knowledge of use of DME;Impaired tone;Decreased balance;Decreased safety awareness;Decreased mobility;Decreased knowledge of precautions;Pain       PT Treatment Interventions DME instruction;Gait training;Balance training;Therapeutic exercise;Functional mobility training;Therapeutic activities;Patient/family education;Cognitive remediation    PT Goals (Current goals can be found in the Care Plan section)  Acute Rehab PT Goals Patient Stated Goal: Pt is eager to continue her therapy PT Goal Formulation: With patient Time For Goal Achievement: 01/25/19 Potential to Achieve Goals: Fair    Frequency Min 2X/week   Barriers to discharge        Co-evaluation PT/OT/SLP Co-Evaluation/Treatment: Yes Reason for Co-Treatment: For patient/therapist safety;To address functional/ADL transfers PT goals addressed during session: Balance;Mobility/safety with mobility         AM-PAC PT "6 Clicks" Mobility  Outcome Measure Help needed turning from your back to your side while in a flat bed without using bedrails?:  A Little Help needed moving from lying on your back to sitting on the side of a flat bed without using bedrails?: A Little Help needed moving to and from a bed to a chair (including a wheelchair)?: A Lot Help needed standing up from a chair using your arms (e.g., wheelchair or bedside chair)?: A Lot Help needed to walk in hospital room?: Total Help needed climbing 3-5 steps with a railing? : Total 6 Click Score: 12    End of Session Equipment Utilized During Treatment: Gait belt Activity Tolerance: Patient limited by fatigue Patient left: in chair;with call bell/phone within reach;with chair alarm set Nurse Communication: Mobility status PT Visit Diagnosis: Other abnormalities of gait and mobility (R26.89);Muscle weakness (generalized) (M62.81) Pain - Right/Left: Right Pain - part of body: Leg    Time: 0160-1093 PT Time Calculation (min) (ACUTE ONLY): 24 min   Charges:   PT Evaluation $PT Eval Moderate Complexity: 1 Mod          Conni Slipper, PT, DPT Acute Rehabilitation Services Pager: [phone] Office: [phone]   Marylynn Pearson 01/11/2019, 12:45  PM   

## 2019-01-11 NOTE — Care Management Obs Status (Signed)
MEDICARE OBSERVATION STATUS NOTIFICATION   Patient Details  Name: [name] [name] MRN: [id number] Date of Birth: [birth year]   Medicare Observation Status Notification Given:  Yes    Bess Kinds, RN 01/11/2019, 12:54 PM

## 2019-01-11 NOTE — Care Management CC44 (Signed)
Condition Code 44 Documentation Completed  Patient Details  Name: [name] [name] MRN: [id number] Date of Birth: September 10, 1930   Condition Code 44 given:  Yes Patient signature on Condition Code 44 notice:  Yes Documentation of 2 MD's agreement:  Yes Code 44 added to claim:  Yes    Bess Kinds, RN 01/11/2019, 12:54 PM

## 2019-01-11 NOTE — Evaluation (Signed)
Occupational Therapy Evaluation Patient Details Name: [name] [name] MRN: [id number] DOB: 10-19-1930 Today's Date: 01/11/2019    History of Present Illness Pt is an 85 y/o female with 6 prior admissions in the last 6 months. She presents from SNF with AMS and R sided numbness and weakness. Noted from last admission pt had R side weakness (subacute at that time), and MRI was negative for acute intracranial changes this admission. PMH significant for RA, gout, DM II, diastolic CHF, PE on Xarelto, CVA, HTN, CAD, low back pain (DDD on MRI).   Clinical Impression   Pt was taking a few steps in rehab and assisted for bathing, dressing and toileting at the SNF from which she was admitted. Pt presents with generalized weakness. She needs set up to total assist for ADL. She requires 2 person assist for stand-pivot transfer to chair. Pt desires to become stronger so she may return home with the assistance of her daughters. Will follow acutely.    Follow Up Recommendations  SNF;Supervision/Assistance - 24 hour    Equipment Recommendations       Recommendations for Other Services       Precautions / Restrictions Precautions Precautions: Fall Restrictions Weight Bearing Restrictions: No      Mobility Bed Mobility Overal bed mobility: Needs Assistance Bed Mobility: Supine to Sit     Supine to sit: Min guard;HOB elevated     General bed mobility comments: Pt was able to transition to EOB with increased time and use of rails, however no assistance required.   Transfers Overall transfer level: Needs assistance Equipment used: 2 person hand held assist Transfers: Sit to/from [employer] CorporationStand;Stand Pivot Transfers Sit to Stand: Mod assist;+2 physical assistance;From elevated surface Stand pivot transfers: Mod assist;+2 physical assistance;From elevated surface       General transfer comment: +2 mod assist for basic sit>stand transfer. Pt was able to demonstrate some pre-gait activity of marching  in place (x2 on each side). Pt required a heavier +2 mod assist to take pivotal steps around to the recliner chair.     Balance Overall balance assessment: Needs assistance Sitting-balance support: Feet supported;Bilateral upper extremity supported Sitting balance-Leahy Scale: Fair Sitting balance - Comments: able to sit EOB without UE or PT support. Pt sat EOB ~5 minutes without difficulty   Standing balance support: Bilateral upper extremity supported Standing balance-Leahy Scale: Poor Standing balance comment: very heavily reliant on UE support on RW and assist from therapist.                            ADL either performed or assessed with clinical judgement   ADL Overall ADL's : Needs assistance/impaired Eating/Feeding: Independent;Sitting   Grooming: Wash/dry hands;Wash/dry face;Oral care;Set up;Sitting   Upper Body Bathing: Minimal assistance;Sitting   Lower Body Bathing: Total assistance;+2 for physical assistance;Sit to/from stand   Upper Body Dressing : Minimal assistance;Sitting   Lower Body Dressing: Total assistance;+2 for physical assistance;Sit to/from stand   Toilet Transfer: +2 for physical assistance;Moderate assistance;Stand-pivot;BSC   Toileting- Clothing Manipulation and Hygiene: Total assistance;+2 for physical assistance;Sit to/from stand       Functional mobility during ADLs: (unable to ambulate)       Vision Baseline Vision/History: Wears glasses Wears Glasses: Reading only Patient Visual Report: No change from baseline       Perception     Praxis      Pertinent Vitals/Pain Pain Assessment: Faces Faces Pain Scale: No hurt Pain Intervention(s): Monitored  during session     Hand Dominance Right   Extremity/Trunk Assessment Upper Extremity Assessment Upper Extremity Assessment: Generalized weakness(RA changes in hands)   Lower Extremity Assessment Lower Extremity Assessment: Defer to PT evaluation RLE Deficits / Details:  Pt able to flex knee up in supine with heel slide. Note wound on R heel covered with pink foam and in prevalon boot. Pt reports sensation is equal R and L and "back to normal"   Cervical / Trunk Assessment Cervical / Trunk Assessment: Other exceptions Cervical / Trunk Exceptions: Forward head posture with rounded shoulders   Communication Communication Communication: No difficulties   Cognition Arousal/Alertness: Awake/alert Behavior During Therapy: WFL for tasks assessed/performed Overall Cognitive Status: Within Functional Limits for tasks assessed                   Orientation Level: (Oriented x4)             General Comments: Not specifically tested, but conversation is normal and pt is alert and cooperative.   General Comments       Exercises     Shoulder Instructions      Home Living Family/patient expects to be discharged to:: Skilled nursing facility                                        Prior Functioning/Environment Level of Independence: Needs assistance  Gait / Transfers Assistance Needed: Pt states she had gotten to the place where she could take a few steps with therapy, but otherwise was at a transfers only level. ADL's / Homemaking Assistance Needed: Pt with longstanding dependence in bathing, dressing and toileting, could self feed and groom and participate in UB dressing            OT Problem List: Decreased strength;Impaired balance (sitting and/or standing);Decreased knowledge of use of DME or AE;Obesity      OT Treatment/Interventions: Self-care/ADL training;Therapeutic exercise;DME and/or AE instruction;Patient/family education;Balance training;Therapeutic activities    OT Goals(Current goals can be found in the care plan section) Acute Rehab OT Goals Patient Stated Goal: Pt is eager to continue her therapy OT Goal Formulation: With patient Time For Goal Achievement: 01/25/19 Potential to Achieve Goals: Good ADL  Goals Pt Will Transfer to Toilet: with mod assist;bedside commode;stand pivot transfer Pt Will Perform Toileting - Clothing Manipulation and hygiene: with min assist;sit to/from stand Pt/caregiver will Perform Home Exercise Program: Increased strength;Both right and left upper extremity;With Supervision  OT Frequency: Min 2X/week   Barriers to D/C:            Co-evaluation PT/OT/SLP Co-Evaluation/Treatment: Yes Reason for Co-Treatment: For patient/therapist safety PT goals addressed during session: Balance;Mobility/safety with mobility OT goals addressed during session: ADL's and self-care      AM-PAC OT "6 Clicks" Daily Activity     Outcome Measure Help from another person eating meals?: None Help from another person taking care of personal grooming?: A Little Help from another person toileting, which includes using toliet, bedpan, or urinal?: Total Help from another person bathing (including washing, rinsing, drying)?: A Lot Help from another person to put on and taking off regular upper body clothing?: A Little Help from another person to put on and taking off regular lower body clothing?: Total 6 Click Score: 14   End of Session Equipment Utilized During Treatment: Gait belt Nurse Communication: Mobility status  Activity Tolerance: Patient tolerated treatment  well Patient left: in chair;with call bell/phone within reach;with chair alarm set  OT Visit Diagnosis: Unsteadiness on feet (R26.81);Muscle weakness (generalized) (M62.81)                Time: 6384-6659 OT Time Calculation (min): 24 min Charges:  OT General Charges $OT Visit: 1 Visit OT Evaluation $OT Eval Moderate Complexity: 1 Mod  Martie Round, OTR/L Acute Rehabilitation Services Pager: [phone] Office: [phone]  [name] [name] 01/11/2019, 2:02 PM

## 2019-01-11 NOTE — Care Management Note (Addendum)
Spoke with daughter, [name]. [facility] chosen for patient placement. Spoke with [place] in admissions at [place]. Initial referral and therapy notes sent to [facility]. Pending approval. Bess Kinds, RN CM 1:41 PM 01/11/19

## 2019-01-12 DIAGNOSIS — I2782 Chronic pulmonary embolism: Secondary | ICD-10-CM | POA: Diagnosis not present

## 2019-01-12 DIAGNOSIS — E1169 Type 2 diabetes mellitus with other specified complication: Secondary | ICD-10-CM | POA: Diagnosis not present

## 2019-01-12 DIAGNOSIS — I5041 Acute combined systolic (congestive) and diastolic (congestive) heart failure: Secondary | ICD-10-CM | POA: Diagnosis not present

## 2019-01-12 DIAGNOSIS — Z86718 Personal history of other venous thrombosis and embolism: Secondary | ICD-10-CM | POA: Diagnosis not present

## 2019-01-12 DIAGNOSIS — Z79899 Other long term (current) drug therapy: Secondary | ICD-10-CM | POA: Diagnosis not present

## 2019-01-12 DIAGNOSIS — E119 Type 2 diabetes mellitus without complications: Secondary | ICD-10-CM | POA: Diagnosis not present

## 2019-01-12 DIAGNOSIS — R0602 Shortness of breath: Secondary | ICD-10-CM | POA: Diagnosis not present

## 2019-01-12 DIAGNOSIS — R569 Unspecified convulsions: Secondary | ICD-10-CM | POA: Diagnosis not present

## 2019-01-12 DIAGNOSIS — E876 Hypokalemia: Secondary | ICD-10-CM | POA: Diagnosis not present

## 2019-01-12 DIAGNOSIS — G458 Other transient cerebral ischemic attacks and related syndromes: Secondary | ICD-10-CM | POA: Diagnosis not present

## 2019-01-12 DIAGNOSIS — G478 Other sleep disorders: Secondary | ICD-10-CM | POA: Diagnosis not present

## 2019-01-12 DIAGNOSIS — R5381 Other malaise: Secondary | ICD-10-CM | POA: Diagnosis not present

## 2019-01-12 DIAGNOSIS — I129 Hypertensive chronic kidney disease with stage 1 through stage 4 chronic kidney disease, or unspecified chronic kidney disease: Secondary | ICD-10-CM | POA: Diagnosis not present

## 2019-01-12 DIAGNOSIS — M48061 Spinal stenosis, lumbar region without neurogenic claudication: Secondary | ICD-10-CM | POA: Diagnosis not present

## 2019-01-12 DIAGNOSIS — Z86711 Personal history of pulmonary embolism: Secondary | ICD-10-CM | POA: Diagnosis not present

## 2019-01-12 DIAGNOSIS — I420 Dilated cardiomyopathy: Secondary | ICD-10-CM | POA: Diagnosis not present

## 2019-01-12 DIAGNOSIS — M069 Rheumatoid arthritis, unspecified: Secondary | ICD-10-CM

## 2019-01-12 DIAGNOSIS — I5040 Unspecified combined systolic (congestive) and diastolic (congestive) heart failure: Secondary | ICD-10-CM | POA: Diagnosis not present

## 2019-01-12 DIAGNOSIS — M0589 Other rheumatoid arthritis with rheumatoid factor of multiple sites: Secondary | ICD-10-CM | POA: Diagnosis not present

## 2019-01-12 DIAGNOSIS — E559 Vitamin D deficiency, unspecified: Secondary | ICD-10-CM | POA: Diagnosis not present

## 2019-01-12 DIAGNOSIS — R05 Cough: Secondary | ICD-10-CM | POA: Diagnosis not present

## 2019-01-12 DIAGNOSIS — Z791 Long term (current) use of non-steroidal anti-inflammatories (NSAID): Secondary | ICD-10-CM | POA: Diagnosis not present

## 2019-01-12 DIAGNOSIS — Z515 Encounter for palliative care: Secondary | ICD-10-CM | POA: Diagnosis not present

## 2019-01-12 DIAGNOSIS — I5042 Chronic combined systolic (congestive) and diastolic (congestive) heart failure: Secondary | ICD-10-CM | POA: Diagnosis not present

## 2019-01-12 DIAGNOSIS — I10 Essential (primary) hypertension: Secondary | ICD-10-CM | POA: Diagnosis not present

## 2019-01-12 DIAGNOSIS — M5410 Radiculopathy, site unspecified: Secondary | ICD-10-CM | POA: Diagnosis not present

## 2019-01-12 DIAGNOSIS — N182 Chronic kidney disease, stage 2 (mild): Secondary | ICD-10-CM | POA: Diagnosis not present

## 2019-01-12 DIAGNOSIS — M5136 Other intervertebral disc degeneration, lumbar region: Secondary | ICD-10-CM | POA: Diagnosis not present

## 2019-01-12 DIAGNOSIS — L8990 Pressure ulcer of unspecified site, unspecified stage: Secondary | ICD-10-CM

## 2019-01-12 DIAGNOSIS — R7989 Other specified abnormal findings of blood chemistry: Secondary | ICD-10-CM | POA: Diagnosis not present

## 2019-01-12 DIAGNOSIS — R400 Somnolence: Secondary | ICD-10-CM | POA: Diagnosis not present

## 2019-01-12 DIAGNOSIS — E568 Deficiency of other vitamins: Secondary | ICD-10-CM | POA: Diagnosis not present

## 2019-01-12 DIAGNOSIS — M109 Gout, unspecified: Secondary | ICD-10-CM | POA: Diagnosis not present

## 2019-01-12 DIAGNOSIS — I252 Old myocardial infarction: Secondary | ICD-10-CM | POA: Diagnosis not present

## 2019-01-12 DIAGNOSIS — G894 Chronic pain syndrome: Secondary | ICD-10-CM | POA: Diagnosis not present

## 2019-01-12 DIAGNOSIS — I82509 Chronic embolism and thrombosis of unspecified deep veins of unspecified lower extremity: Secondary | ICD-10-CM | POA: Diagnosis not present

## 2019-01-12 DIAGNOSIS — G9340 Encephalopathy, unspecified: Secondary | ICD-10-CM | POA: Diagnosis not present

## 2019-01-12 DIAGNOSIS — I130 Hypertensive heart and chronic kidney disease with heart failure and stage 1 through stage 4 chronic kidney disease, or unspecified chronic kidney disease: Secondary | ICD-10-CM | POA: Diagnosis not present

## 2019-01-12 DIAGNOSIS — D500 Iron deficiency anemia secondary to blood loss (chronic): Secondary | ICD-10-CM | POA: Diagnosis not present

## 2019-01-12 DIAGNOSIS — E1122 Type 2 diabetes mellitus with diabetic chronic kidney disease: Secondary | ICD-10-CM | POA: Diagnosis not present

## 2019-01-12 DIAGNOSIS — R52 Pain, unspecified: Secondary | ICD-10-CM | POA: Diagnosis not present

## 2019-01-12 DIAGNOSIS — I2510 Atherosclerotic heart disease of native coronary artery without angina pectoris: Secondary | ICD-10-CM | POA: Diagnosis not present

## 2019-01-12 DIAGNOSIS — I509 Heart failure, unspecified: Secondary | ICD-10-CM

## 2019-01-12 DIAGNOSIS — R195 Other fecal abnormalities: Secondary | ICD-10-CM | POA: Diagnosis not present

## 2019-01-12 DIAGNOSIS — D638 Anemia in other chronic diseases classified elsewhere: Secondary | ICD-10-CM | POA: Diagnosis not present

## 2019-01-12 DIAGNOSIS — I6930 Unspecified sequelae of cerebral infarction: Secondary | ICD-10-CM | POA: Diagnosis not present

## 2019-01-12 DIAGNOSIS — N183 Chronic kidney disease, stage 3 (moderate): Secondary | ICD-10-CM

## 2019-01-12 DIAGNOSIS — Z8673 Personal history of transient ischemic attack (TIA), and cerebral infarction without residual deficits: Secondary | ICD-10-CM | POA: Diagnosis not present

## 2019-01-12 DIAGNOSIS — R451 Restlessness and agitation: Secondary | ICD-10-CM | POA: Diagnosis not present

## 2019-01-12 DIAGNOSIS — M0689 Other specified rheumatoid arthritis, multiple sites: Secondary | ICD-10-CM | POA: Diagnosis not present

## 2019-01-12 DIAGNOSIS — Z743 Need for continuous supervision: Secondary | ICD-10-CM | POA: Diagnosis not present

## 2019-01-12 DIAGNOSIS — R531 Weakness: Secondary | ICD-10-CM | POA: Diagnosis not present

## 2019-01-12 DIAGNOSIS — M7501 Adhesive capsulitis of right shoulder: Secondary | ICD-10-CM | POA: Diagnosis not present

## 2019-01-12 DIAGNOSIS — Z7901 Long term (current) use of anticoagulants: Secondary | ICD-10-CM | POA: Diagnosis not present

## 2019-01-12 DIAGNOSIS — R279 Unspecified lack of coordination: Secondary | ICD-10-CM | POA: Diagnosis not present

## 2019-01-12 DIAGNOSIS — E785 Hyperlipidemia, unspecified: Secondary | ICD-10-CM | POA: Diagnosis not present

## 2019-01-12 DIAGNOSIS — G8929 Other chronic pain: Secondary | ICD-10-CM | POA: Diagnosis not present

## 2019-01-12 LAB — CBC
HCT: 25 % — ABNORMAL LOW (ref 36.0–46.0)
Hemoglobin: 7.9 g/dL — ABNORMAL LOW (ref 12.0–15.0)
MCH: 30.3 pg (ref 26.0–34.0)
MCHC: 31.6 g/dL (ref 30.0–36.0)
MCV: 95.8 fL (ref 80.0–100.0)
Platelets: 188 10*3/uL (ref 150–400)
RBC: 2.61 MIL/uL — ABNORMAL LOW (ref 3.87–5.11)
RDW: 20.8 % — ABNORMAL HIGH (ref 11.5–15.5)
WBC: 7.2 10*3/uL (ref 4.0–10.5)
nRBC: 0 % (ref 0.0–0.2)

## 2019-01-12 LAB — MAGNESIUM: Magnesium: 1.9 mg/dL (ref 1.7–2.4)

## 2019-01-12 LAB — BASIC METABOLIC PANEL
Anion gap: 8 (ref 5–15)
BUN: 27 mg/dL — ABNORMAL HIGH (ref 8–23)
CO2: 25 mmol/L (ref 22–32)
Calcium: 8.2 mg/dL — ABNORMAL LOW (ref 8.9–10.3)
Chloride: 108 mmol/L (ref 98–111)
Creatinine, Ser: 1.17 mg/dL — ABNORMAL HIGH (ref 0.44–1.00)
GFR calc Af Amer: 49 mL/min — ABNORMAL LOW (ref >60)
GFR calc non Af Amer: 42 mL/min — ABNORMAL LOW (ref >60)
Glucose, Bld: 100 mg/dL — ABNORMAL HIGH (ref 70–99)
Potassium: 3.2 mmol/L — ABNORMAL LOW (ref 3.5–5.1)
Sodium: 141 mmol/L (ref 135–145)

## 2019-01-12 LAB — GLUCOSE, CAPILLARY
Glucose-Capillary: 131 mg/dL — ABNORMAL HIGH (ref 70–99)
Glucose-Capillary: 100 mg/dL — ABNORMAL HIGH (ref 70–99)

## 2019-01-12 MED ORDER — HYDROCODONE-ACETAMINOPHEN 5-325 MG PO TABS: 1.0000 | ORAL_TABLET | Freq: Four times a day (QID) | ORAL | 0 refills | Status: DC

## 2019-01-12 MED ORDER — POTASSIUM CHLORIDE CRYS ER 20 MEQ PO TBCR
40.0000 meq | EXTENDED_RELEASE_TABLET | ORAL | Status: AC
  Administered 2019-01-12 (×2): 40 meq via ORAL
  Filled 2019-01-12 (×2): qty 2

## 2019-01-12 NOTE — Clinical Social Work Placement (Deleted)
   CLINICAL SOCIAL WORK PLACEMENT  NOTE  Date:  01/12/2019  Patient Details  Name: [name] [name] MRN: [id number] Date of Birth: 1930/06/25  Clinical Social Work is seeking post-discharge placement for this patient at the Skilled  Nursing Facility level of care (*CSW will initial, date and re-position this form in  chart as items are completed):  No   Patient/family provided with [facility] Clinical Social Work Department's list of facilities offering this level of care within the geographic area requested by the patient (or if unable, by the patient's family).  Yes   Patient/family informed of their freedom to choose among providers that offer the needed level of care, that participate in Medicare, Medicaid or managed care program needed by the patient, have an available bed and are willing to accept the patient.  Yes   Patient/family informed of [facility]'s ownership interest in [facility] and [facility], as well as of the fact that they are under no obligation to receive care at these facilities.  PASRR submitted to EDS on       PASRR number received on       Existing PASRR number confirmed on 01/10/19     FL2 transmitted to all facilities in geographic area requested by pt/family on 01/10/19     FL2 transmitted to all facilities within larger geographic area on       Patient informed that his/her managed care company has contracts with or will negotiate with certain facilities, including the following:        Yes   Patient/family informed of bed offers received.  Patient chooses bed at [facility]     Physician recommends and patient chooses bed at      Patient to be transferred to [facility] on 01/12/19.  Patient to be transferred to facility by Ambulance     Patient family notified on 01/12/19 of transfer.  Name of family member notified:        PHYSICIAN       Additional Comment:    _______________________________________________ Cristobal Goldmann, LCSW 01/12/2019, 11:53 AM

## 2019-01-12 NOTE — Discharge Summary (Signed)
Physician Discharge Summary  [name] [name]:[id number] DOB: Sep 09, 1930 DOA: 01/09/2019  PCP: Helane Rima, DO  Admit date: 01/09/2019 Discharge date: 01/12/2019  Time spent: 45 minutes  Recommendations for Outpatient Follow-up:  Patient will be discharged to skilled nursing facility, continue physical and occupational therapy.  Patient will need to follow up with primary care provider within one week of discharge, repeat CBC and BMP. Follow up with Dr. Darrelyn Hillock, orthopedics on 01/16/2019.  Patient should continue medications as prescribed.  Patient should follow a heart healthy/carb modified diet.   Discharge Diagnoses:  Principal problem: Acute encephalopathy, unclear etiology Elevated troponin Rheumatoid arthritis with recent flare requiring hospitalization Severe lumbar spinal stenosis Chronic combined systolic and diastolic heart failure Essential hypertension Diabetes mellitus, type II Chronic kidney disease, stage III Hypokalemia Right heel wound Chronic normocytic anemia  Discharge Condition: Stable  Diet recommendation: heart healthy/carb modified  Filed Weights   01/10/19 2039 01/11/19 2058 01/12/19 0500  Weight: 86.4 kg 86.8 kg 86.8 kg    History of present illness:  on 01/09/2019 by Dr. Mary Sella a 85 y.o.femalewith medical history significant ofHTN; DM; CVA; seizures; CAD; and CHF presenting with weakness.Shortly after discharge to [facility], she went for rehab with the goal to help her stand and walk with the goal to proceed with surgery. She has done fairly well but is still having a lot of back pain. Her daughter took her to the spine specialist Thursday and she was ok. Friday, she was weak and her color wasn't good - maybe tired from the day before. Saturday was a bit better - spirits better, talking, but not up and about. This AM, she had slurred speech, difficulty talking, cold/stiff, felt like her body was numb all over, they  couldn't get her up to chair. She was aware of everything last night and today is basically unresponsive. She was "full of fever" - T 100.?, but subjective fever. They have not been monitoring her Na++ there and she has gained 6-7 pounds. +LE edema. Her daughter has not noticed significant or persistent SOB. Today, she had a cough that was new today, ?productive. She has had increased confusion since last hospitalization.  She was hospitalized on 2/2 and discharged 2/11due to fever thought to be related to a RA flare; this was treated with prednisone and resumption of home DMARDs after initial antibiotics were discontinued. She had stable severe L4-S1 stenosis. She was euvolemic during this hospitalization, with known h/o chronic combined CHF. She has a prior DVT/PE from 9/19 and Xarelto was continued.  Hospital Course:  Acute encephalopathy, unclear etiology -Resolved, patient currently alert and oriented x3 -Patient was obtunded on presentation, etiology unclear at this time. -UA as well as chest x-ray unremarkable for infection -Blood cultures no growth to date -CT head: stable appearance of the brain without acute intracranial findings  -MRI brain: no acute intracranial abnormality identified   Elevated troponin -Currently no complaints of chest pain -Troponin has been flat  Rheumatoid arthritis with recent flare requiring hospitalization -Wrist inflammation with hand surgery involvement, thought to be inflammatory arthritis -Patient was placed on antibiotics earlier these were discontinued -Continue DMARD, prednisone  Severe lumbar spinal stenosis -Of L4-5, L5-S1 -There is a plan for surgical repair of this issue, as patient is continuing to have severe mobility issues at her SNF -Will need to further outpatient follow-up -Discussed with Dr. Darrelyn Hillock, orthopedics- patient to follow up with him on Monday 01/16/2019, she will need to call the office.  -  There is documentation  in Epic from cardiology, Dr. Duke Salvia and Dr. Elease Hashimoto- 01/05/2019 "She is at low - moderate risk for her cervical surgery ( due to age and other medical issues ). She does not need any further cardiac work up" Pt can be cleared for surgery w/o any further cardiac work up. Her PCP manages her Xarelto given h/o DVT/ PE and CVA. Recs regarding Xarelto will need to come from PCP.  -Continue pain control  Chronic combined systolic and diastolic heart failure -Patient had dietary indiscretion at her facility -Patient had mild volume overload on chest x-ray however clinically she appears to be euvolemic and compensated -Continue Lasix 20 mg daily -Monitor intake and output, daily weights -Continue Coreg, Entresto  Essential hypertension -Continue hydralazine and coreg, Entresto  Diabetes mellitus, type II -Last hemoglobin A1c was 5.9 in August 2019 -appears to be diet controlled, no home medications  Chronic kidney disease, stage III -Creatinine appears to be stable  Hypokalemia -Replaced, repeat BMP in one week -Magnesium 1.9  Right heel wound -Present on admission -Wound care consulted and appreciated continue float heel to reduce pressure and Prevalon boot.  Foam dressing to protect further injury  Chronic normocytic anemia -Hemoglobin currently 7.9, appears to be close to her baseline from last month however has been higher in the past -FOBT negative -Anemia panel shows iron of 35, ferritin 11/11/2002, saturation ratio 14  Procedures: None  Consultations: Dr. Darrelyn Hillock, via phone (ortho)  Discharge Exam: Vitals:   01/12/19 0826 01/12/19 1035  BP: (!) 173/84 (!) 145/82  Pulse: 89 85  Resp: 18   Temp: 97.6 F (36.4 C)   SpO2: 100%      General: Well developed, well nourished, NAD, appears stated age  HEENT: NCAT, mucous membranes moist.  Cardiovascular: S1 S2 auscultated, RRR  Respiratory: Clear to auscultation bilaterally with equal chest rise  Abdomen: Soft,  nontender, nondistended, + bowel sounds  Extremities: warm dry without cyanosis clubbing  Neuro: AAOx3, nonfocal  Psych: Appropriate mood and affect  Discharge Instructions Discharge Instructions    Discharge instructions   Complete by:  As directed    Patient will be discharged to skilled nursing facility, continue physical and occupational therapy.  Patient will need to follow up with primary care provider within one week of discharge, repeat CBC and BMP. Follow up with Dr. Darrelyn Hillock, orthopedics on 01/16/2019.  Patient should continue medications as prescribed.  Patient should follow a heart healthy/carb modified diet.     Allergies as of 01/12/2019      Reactions   Clonidine Derivatives Swelling   [name]   Fish Allergy Diarrhea, Swelling, Other (See Comments)   Turns skin "black," but can tolerate white fish Salmon- Diarrhea   Shellfish Allergy Hives   Doxycycline Rash   Indomethacin Other (See Comments)   Reaction not recalled by the patient   Lyrica [pregabalin] Other (See Comments)   Hallucinations   Methyldopa Other (See Comments)   Aldomet (for hypertension): Reaction not recalled by the patient   Morphine And Related Other (See Comments)   Family reports it drops her bp that she needs iv fluids   Orange Fruit [citrus] Other (See Comments)   Indigestion/heartburn   Strawberry (diagnostic) Itching   Cetirizine Hcl Itching, Rash   Codeine Itching   Levaquin [levofloxacin In D5w] Rash   Tomato Rash      Medication List    TAKE these medications   albuterol 108 (  90 Base) MCG/ACT inhaler Commonly known as:  PROVENTIL HFA;VENTOLIN HFA Inhale 1-2 puffs into the lungs every 6 (six) hours as needed for wheezing or shortness of breath.   allopurinol 100 MG tablet Commonly known as:  ZYLOPRIM Take 1 tablet (100 mg total) by mouth at bedtime.   carvedilol 25 MG tablet Commonly known as:  COREG Take  1 tablet (25 mg total) by mouth 2 (two) times daily with a meal.   D3-1000 25 MCG (1000 UT) capsule Generic drug:  Cholecalciferol Take 1 capsule (1,000 Units total) by mouth daily.   diclofenac sodium 1 % Gel Commonly known as:  VOLTAREN Apply 2 g topically daily as needed (pain).   docusate sodium 100 MG capsule Commonly known as:  COLACE Take 1 capsule (100 mg total) by mouth daily as needed for mild constipation.   ENSURE Take 237 mLs by mouth every evening.   furosemide 20 MG tablet Commonly known as:  LASIX Take 1 tablet (20 mg total) by mouth daily.   gabapentin 400 MG capsule Commonly known as:  NEURONTIN Take 1 capsule (400 mg total) by mouth 3 (three) times daily.   hydrALAZINE 100 MG tablet Commonly known as:  APRESOLINE Take 1 tablet (100 mg total) by mouth 3 (three) times daily.   HYDROcodone-acetaminophen 5-325 MG tablet Commonly known as:  NORCO/VICODIN Take 1 tablet by mouth every 6 (six) hours.   hydroxychloroquine 200 MG tablet Commonly known as:  PLAQUENIL Take 1 tablet (200 mg total) by mouth 2 (two) times daily.   hydroxypropyl methylcellulose / hypromellose 2.5 % ophthalmic solution Commonly known as:  ISOPTO TEARS / GONIOVISC Place 1 drop into both eyes daily.   isosorbide mononitrate 30 MG 24 hr tablet Commonly known as:  IMDUR Take 1 tablet (30 mg total) by mouth at bedtime.   leflunomide 20 MG tablet Commonly known as:  ARAVA Take 1 tablet (20 mg total) by mouth daily.   loratadine 10 MG tablet Commonly known as:  CLARITIN Take 1 tablet (10 mg total) by mouth daily.   Melatonin 3 MG Tabs Take 1 tablet (3 mg total) by mouth at bedtime.   Olopatadine HCl 0.2 % Soln Place 1 drop into both eyes daily.   predniSONE 10 MG tablet Commonly known as:  DELTASONE 2/12 start 20 mg x 3 days then resume 10 mg on 2/15 What changed:    how much to take  how to take this  when to take this  additional instructions   rivaroxaban 20 MG  Tabs tablet Commonly known as:  XARELTO Take 1 tablet (20 mg total) by mouth daily with supper.   sacubitril-valsartan 49-51 MG Commonly known as:  ENTRESTO Take 1 tablet by mouth daily at 12 noon.   vitamin C 500 MG tablet Commonly known as:  ASCORBIC ACID Take 1 tablet (500 mg total) by mouth daily.      Allergies  Allergen Reactions  . Clonidine Derivatives Swelling    [name]  . Fish Allergy Diarrhea, Swelling and Other (See Comments)    Turns skin "black," but can tolerate white fish Salmon- Diarrhea  . Shellfish Allergy Hives  . Doxycycline Rash  . Indomethacin Other (See Comments)    Reaction not recalled by the patient  . Lyrica [Pregabalin] Other (See Comments)    Hallucinations   . Methyldopa Other (See Comments)    Aldomet (for hypertension): Reaction not recalled by the patient  . Morphine  And Related Other (See Comments)    Family reports it drops her bp that she needs iv fluids  . Orange Fruit [Citrus] Other (See Comments)    Indigestion/heartburn  . Strawberry (Diagnostic) Itching  . Cetirizine Hcl Itching and Rash  . Codeine Itching  . Levaquin [Levofloxacin In D5w] Rash  . Tomato Rash   Follow-up Information    Helane RimaWallace, Erica, DO. Schedule an appointment as soon as possible for a visit in 1 week(s).   Specialty:  Family Medicine Why:  Hospital follow up Contact information: [address] [place] [address] [phone]        Chilton Siandolph, Tiffany, MD .   Specialty:  Cardiology Contact information: [address] [place] [address] [phone]        Ranee GosselinGioffre, Ronald, MD. Go on 01/16/2019.   Specialty:  Orthopedic Surgery Why:  Call office for appointment time.  Contact information: [address] [place] [address] [phone]            The results of significant diagnostics from this hospitalization (including  imaging, microbiology, ancillary and laboratory) are listed below for reference.    Significant Diagnostic Studies: Dg Chest 2 View  Result Date: 01/09/2019 CLINICAL DATA:  Shortness of Breath EXAM: CHEST - 2 VIEW COMPARISON:  12/13/2018 FINDINGS: Cardiomegaly with vascular congestion. No confluent opacities or effusions. No overt edema. Degenerative changes in the shoulders. IMPRESSION: Cardiomegaly, vascular congestion. Electronically Signed   By: Charlett NoseKevin  Dover [name]   On: 01/09/2019 12:41   Ct Head Wo Contrast  Result Date: 01/09/2019 CLINICAL DATA:  Generalized weakness for 1 week. Productive cough and fever today. EXAM: CT HEAD WITHOUT CONTRAST TECHNIQUE: Contiguous axial images were obtained from the base of the skull through the vertex without intravenous contrast. COMPARISON:  CT head 07/23/2018. FINDINGS: Brain: There is no evidence of acute intracranial hemorrhage, mass lesion, brain edema or extra-axial fluid collection. The ventricles and subarachnoid spaces are appropriately sized for age. There is no CT evidence of acute cortical infarction. There is stable patchy low-density in the periventricular white matter, likely due to chronic small vessel ischemic changes. Vascular: Intracranial vascular calcifications. No hyperdense vessel identified. Skull: Negative for fracture or focal lesion. Sinuses/Orbits: Mild frontal, ethmoid and left sphenoid sinus mucosal thickening without air-fluid levels. The maxillary sinuses are clear. The mastoid air cells and middle ears are clear. No significant orbital findings. Other: None. IMPRESSION: 1. Stable appearance of the brain without acute intracranial findings. 2. Stable mild chronic small vessel ischemic changes. 3. New mild mucosal thickening throughout the paranasal sinuses. Electronically Signed   By: Carey BullocksWilliam  Veazey [name]   On: 01/09/2019 16:29   Mr Brain Wo Contrast  Result Date: 01/10/2019 CLINICAL DATA:  85 y/o F; Focal neuro deficit, > 6 hrs,  stroke suspected, weakness. EXAM: MRI HEAD WITHOUT CONTRAST TECHNIQUE: Multiplanar, multiecho pulse sequences of the brain and surrounding structures were obtained without intravenous contrast. COMPARISON:  01/09/2019 CT head.  06/25/2019 MRI head. FINDINGS: Brain: No acute infarction, hemorrhage, hydrocephalus, extra-axial collection or mass lesion. Early confluent nonspecific T2 FLAIR hyperintensities in subcortical and periventricular white matter are compatible with moderate chronic microvascular ischemic changes. Moderate volume loss of the brain. Very small chronic infarctions within the right inferior cerebellar hemisphere and left hemi pons. Vascular: Normal flow voids. Skull and upper cervical spine: Normal marrow signal. Sinuses/Orbits: Mild mucosal thickening of frontal and anterior ethmoid air cells. No significant abnormal signal of mastoid air cells. Bilateral intra-ocular lens  replacement. Other: None. IMPRESSION: 1. No acute intracranial abnormality identified. 2. Moderate chronic microvascular ischemic changes and volume loss of the brain. Electronically Signed   By: Mitzi Hansen [name]   On: 01/10/2019 13:50   Mr Lumbar Spine Wo Contrast  Result Date: 12/16/2018 CLINICAL DATA:  Back pain.  Inability to bear weight on legs. EXAM: MRI LUMBAR SPINE WITHOUT CONTRAST TECHNIQUE: Multiplanar, multisequence MR imaging of the lumbar spine was performed. No intravenous contrast was administered. COMPARISON:  Two recent examinations: MRI 11/15/2018 and 10/21/2018 FINDINGS: Segmentation: The same numbering system is use as the prior MRIs with partial lumbarization of S1 and a near full disc space at S1-2. Alignment: Stable advanced degenerative lumbar spondylosis with multilevel disc disease and facet disease. Degenerative anterolisthesis of L4 and L5 is stable along with mild degenerative retrolisthesis of L2. Vertebrae: Stable compression deformity of L1 with L1 hemangioma. No acute lumbar spine  fracture. No worrisome bone lesions. Conus medullaris and cauda equina: Conus extends to the L1-2 level. Conus and cauda equina appear normal. Paraspinal and other soft tissues: No significant findings. Stable right renal cyst. Disc levels: L1-2: Mild facet disease but no disc protrusions, spinal or foraminal stenosis. No change since recent MRIs. L2-3: Advanced degenerate disc disease and moderate facet disease. There is a bulging uncovered disc and osteophytic ridging with mild flattening of the ventral thecal sac and mild bilateral lateral recess encroachment. There is also mild foraminal encroachment, left greater than right. This appears stable. L3-4: Stable bulging degenerated annulus, osteophytic ridging, short pedicles and facet disease contributing to moderate spinal and bilateral lateral recess stenosis. No significant foraminal stenosis. L4-5: Degenerative anterolisthesis of L4 with a bulging uncovered disc. This in combination with short pedicles and severe facet disease contributes to moderately severe spinal and bilateral lateral recess stenosis and moderate bilateral foraminal stenosis. These findings are stable. L5-S1: Degenerative anterolisthesis of L5 with a bulging uncovered disc. There is also severe facet disease and short pedicles contributing to severe spinal and bilateral lateral recess stenosis and moderate bilateral foraminal stenosis. These findings appear stable. S1-S2: Facet disease but no disc protrusions or foraminal stenosis. IMPRESSION: 1. Stable advanced degenerative lumbar spondylosis with multilevel disc disease and facet disease. 2. Stable compression deformity of L1.  No acute bony abnormalities. 3. Stable multifactorial moderate spinal and bilateral lateral recess stenosis at L3-4. 4. Stable multifactorial moderately severe spinal and bilateral lateral recess stenosis at L4-5. There is also moderate bilateral foraminal stenosis. 5. Stable multifactorial severe spinal and  bilateral lateral recess stenosis at L5-S1 and moderate bilateral foraminal stenosis. Electronically Signed   By: Rudie Meyer [name]   On: 12/16/2018 11:01   Mr Hand Right W Wo Contrast  Result Date: 12/13/2018 CLINICAL DATA:  Hand swelling, cellulitis EXAM: MRI OF THE RIGHT HAND WITHOUT AND WITH CONTRAST TECHNIQUE: Multiplanar, multisequence MR imaging of the right hand was performed before and after the administration of intravenous contrast. CONTRAST:  7.5 mL Gadavist COMPARISON:  None. FINDINGS: Bones/Joint/Cartilage No acute fracture or dislocation. Scapholunate widening consistent with a scapholunate ligament tear with proximal migration of the capitate consistent with a SLAC wrist. Severe carpal synovitis. Multiple erosions involving the carpal bones. Carpal crowding. Erosion of the distal radius. Erosion of the first metacarpal head. Small joint effusion and synovitis of the second, third, fourth and fifth MCP joints. Mild osteoarthritis of the first CMC joint. Ligaments Collateral ligaments are intact. Muscles and Tendons Muscles are normal. No muscle atrophy. Flexor and extensor compartment tendons are intact.  Soft tissue Soft tissue edema with enhancement along the radial aspect of the distal forearm and wrist. Complex peripherally enhancing fluid collection measuring approximately 1.8 x 4.2 x 6.9 cm along the dorsal radial aspect of the forearm and wrist most concerning for an abscess. No soft tissue mass. IMPRESSION: 1. Cellulitis of the distal radial aspect of forearm and wrist. 1.8 x 4.2 x 6.9 cm peripherally enhancing fluid collection along the dorsal radial aspect of the distal forearm and wrist most concerning for an abscess. The fluid collection extends into the distal forearm which is outside of the field of view limiting full characterization of the extent of fluid collection. 2. Erosive changes of the carpus with carpal crowding and diffuse carpal synovitis as can be seen with an  inflammatory arthropathy such as rheumatoid arthritis. 3. Small joint effusion and synovitis of the second, third, fourth and fifth MCP joints. 4. SLAC wrist. Electronically Signed   By: Elige Ko   On: 12/13/2018 20:48   Dg Foot 2 Views Right  Result Date: 01/10/2019 CLINICAL DATA:  Open wound on the right heel. EXAM: RIGHT FOOT - 2 VIEW COMPARISON:  None. FINDINGS: No fracture or bone lesion. There is no bone resorption to suggest osteomyelitis. Skeletal structures are demineralized. There is mild joint space narrowing at the first metatarsophalangeal joint. Remaining joints are normally spaced and aligned. Small to moderate plantar and small dorsal calcaneal spurs. No soft tissue air. IMPRESSION: 1. No fracture bone lesion or evidence of osteomyelitis. 2. Calcaneal spurs.  No soft tissue air. Electronically Signed   By: Amie Portland [name]   On: 01/10/2019 11:10    Microbiology: Recent Results (from the past 240 hour(s))  Culture, blood (Routine x 2)     Status: None (Preliminary result)   Collection Time: 01/09/19 11:02 AM  Result Value Ref Range Status   Specimen Description BLOOD BLOOD LEFT FOREARM  Final   Special Requests   Final    BOTTLES DRAWN AEROBIC AND ANAEROBIC Blood Culture adequate volume   Culture   Final    NO GROWTH 2 DAYS Performed at [facility], [address] [place], [place] [address]    Report Status PENDING  Incomplete  Culture, blood (Routine x 2)     Status: None (Preliminary result)   Collection Time: 01/09/19 11:07 AM  Result Value Ref Range Status   Specimen Description BLOOD BLOOD RIGHT HAND  Final   Special Requests AEROBIC BOTTLE ONLY Blood Culture adequate volume  Final   Culture   Final    NO GROWTH 2 DAYS Performed at [facility], [address], [place], [place] [address]    Report Status PENDING  Incomplete  Culture, Urine     Status: None   Collection Time: 01/09/19  6:36 PM  Result Value Ref Range Status   Specimen Description  URINE, CATHETERIZED  Final   Special Requests NONE  Final   Culture   Final    NO GROWTH Performed at [facility], [address] [place], [place] [address]    Report Status 01/11/2019 FINAL  Final  MRSA PCR Screening     Status: None   Collection Time: 01/09/19  7:34 PM  Result Value Ref Range Status   MRSA by PCR NEGATIVE NEGATIVE Final    Comment:        The GeneXpert MRSA Assay (FDA approved for NASAL specimens only), is one component of a comprehensive MRSA colonization surveillance program. It  is not intended to diagnose MRSA infection nor to guide or monitor treatment for MRSA infections. Performed at [facility], [address], [place], [place] [address]      Labs: Basic Metabolic Panel: Recent Labs  Lab 01/09/19 1147 01/10/19 0429 01/11/19 0536 01/12/19 0508  NA 138 141 141 141  K 3.6 3.2* 3.8 3.2*  CL 107 107 107 108  CO2 20* GLUCOSE 115* 138* 145* 100*  BUN 22 22 28* 27*  CREATININE 0.99 1.07* 1.15* 1.17*  CALCIUM 8.3* 8.6* 8.3* 8.2*  MG  --   --  2.0 1.9   Liver Function Tests: Recent Labs  Lab 01/09/19 1147 01/11/19 0536  AST 23 16  ALT 19 15  ALKPHOS 47 39  BILITOT 0.8 0.4  PROT 5.5* 5.4*  ALBUMIN 2.9* 2.7*   No results for input(s): LIPASE, AMYLASE in the last 168 hours. No results for input(s): AMMONIA in the last 168 hours. CBC: Recent Labs  Lab 01/09/19 1147 01/10/19 0429 01/11/19 0536 01/12/19 0508  WBC 7.5 5.8 7.4 7.2  NEUTROABS 6.0  --   --   --   HGB 8.4* 8.2* 7.7* 7.9*  HCT 26.8* 25.3* 24.4* 25.0*  MCV 98.2 95.5 95.7 95.8  PLT 158 163 180 188   Cardiac Enzymes: Recent Labs  Lab 01/09/19 1147 01/10/19 1012  TROPONINI 0.04* 0.03*   BNP: BNP (last 3 results) Recent Labs    06/22/18 2153 07/23/18 2100 01/09/19 1657  BNP 1,146.7* 138.3* 2,955.1*    ProBNP (last 3 results) No results for input(s): PROBNP in the last 8760 hours.  CBG: Recent Labs  Lab 01/11/19 0730 01/11/19 1122  01/11/19 1650 01/11/19 2118 01/12/19 0823  GLUCAP 123* 81 170* 136* 131*       Signed:  Lisett Dirusso  [facility] 01/12/2019, 10:53 AM

## 2019-01-12 NOTE — Clinical Social Work Placement (Signed)
   CLINICAL SOCIAL WORK PLACEMENT  NOTE *DISCHARGED TO [facility] VIA AMBULANCE  Date:  01/12/2019  Patient Details  Name: [name] [name] MRN: [id number] Date of Birth: December 17, 1929  Clinical Social Work is seeking post-discharge placement for this patient at the Skilled  Nursing Facility level of care (*CSW will initial, date and re-position this form in  chart as items are completed):  No   Patient/family provided with [facility] Clinical Social Work Department's list of facilities offering this level of care within the geographic area requested by the patient (or if unable, by the patient's family).  Yes   Patient/family informed of their freedom to choose among providers that offer the needed level of care, that participate in Medicare, Medicaid or managed care program needed by the patient, have an available bed and are willing to accept the patient.  Yes   Patient/family informed of [facility]'s ownership interest in [facility] and [facility], as well as of the fact that they are under no obligation to receive care at these facilities.  PASRR submitted to EDS on       PASRR number received on       Existing PASRR number confirmed on 01/10/19     FL2 transmitted to all facilities in geographic area requested by pt/family on 01/10/19     FL2 transmitted to all facilities within larger geographic area on       Patient informed that his/her managed care company has contracts with or will negotiate with certain facilities, including the following:        Yes   Patient/family informed of bed offers received.  Patient chooses bed at [facility]     Physician recommends and patient chooses bed at      Patient to be transferred to [facility] on 01/12/19.  Patient to be transferred to facility by Ambulance     Patient family notified on 01/12/19 of transfer.  Name of family member notified:  Daughter Rodman Comp [phone]     PHYSICIAN     Additional Comment:    _______________________________________________ Cristobal Goldmann, LCSW 01/12/2019, 2:14 PM

## 2019-01-12 NOTE — Telephone Encounter (Signed)
Error

## 2019-01-14 LAB — CULTURE, BLOOD (ROUTINE X 2)
Culture: NO GROWTH
Culture: NO GROWTH
Special Requests: ADEQUATE
Special Requests: ADEQUATE

## 2019-01-16 DIAGNOSIS — M0689 Other specified rheumatoid arthritis, multiple sites: Secondary | ICD-10-CM | POA: Diagnosis not present

## 2019-01-16 DIAGNOSIS — I5041 Acute combined systolic (congestive) and diastolic (congestive) heart failure: Secondary | ICD-10-CM | POA: Diagnosis not present

## 2019-01-16 DIAGNOSIS — N182 Chronic kidney disease, stage 2 (mild): Secondary | ICD-10-CM | POA: Diagnosis not present

## 2019-01-16 DIAGNOSIS — G458 Other transient cerebral ischemic attacks and related syndromes: Secondary | ICD-10-CM | POA: Diagnosis not present

## 2019-01-16 DIAGNOSIS — I130 Hypertensive heart and chronic kidney disease with heart failure and stage 1 through stage 4 chronic kidney disease, or unspecified chronic kidney disease: Secondary | ICD-10-CM | POA: Diagnosis not present

## 2019-01-17 ENCOUNTER — Non-Acute Institutional Stay: Payer: Medicare Other | Admitting: Adult Health Nurse Practitioner

## 2019-01-17 DIAGNOSIS — Z515 Encounter for palliative care: Secondary | ICD-10-CM

## 2019-01-17 NOTE — Progress Notes (Signed)
Therapist, nutritional Palliative Care Consult Note Telephone: [phone]  Fax: [phone] 01/17/2019  PATIENT NAME: [name] [name] DOB: Aug 28, 1930 MRN: [id number]  PRIMARY CARE PROVIDER:   Helane Rima, DO  REFERRING PROVIDER:  Dr. Lily [name]  RESPONSIBLE PARTY:  [name] [name], daughter, [phone]        RECOMMENDATIONS and PLAN:  1.  Patient has had 3 hospitalizations this year.  The last one was 01/09/2019 to 01/12/2019 in which she had acute encephalopathy of unknown origin.  Patient had hospitalization in February for RA flare up.  Patient does state that she has not been at her home for very long since January due to her hospitalizations.  Does state that when she is at home she has an aide that is with her from 9-12 in the mornings 7 days a week and that her 2 daughters help her.  Was supposed to start CAP program to get assistance at night but have not been home to get that started.  2.  Lumbar spinal stenosis.  Patient has severe spinal stenosis of lumbar spine and was getting therapy services to get strong enough to go through surgery.  Her plans are still to get strong enough to go through with the back surgery and be able to return home.  She does admit that her daughters do think it is a good idea for her to return home by herself.  Was supposed to have appointment with orthopedic yesterday but she was not feeling well enough to go.  This to be rescheduled when she is feeling better.  3.  Pain.  Patient has a lot of pain due to her spinal stenosis and sciatic pain.  She is currently on Hydrocodone scheduled BID and Percocet PRN and gabapentin TID.  Staff reports that she was drowsy and at times not very responsive yesterday. Feels like it could be due to her pain meds. Today is she is more herself and states that she has already been working with therapy this morning.  Patient states that Dr. Harrison [name] has spoken with her daughter about changes to meds.  Staff  does not report any changes to her meds at this time.  4.  Goal of care.  Patient states she is a DNR.  States that her daughter takes care of all that and would like me to talk to her.  Left message with daughter, [name] [name].  Will discuss goals of care and advance care planning with her.  I spent 30 minutes providing this consultation,  from 11:00 to 11:30. More than 50% of the time in this consultation was spent coordinating communication.   HISTORY OF PRESENT ILLNESS:  [name] [name] is a 85 y.o. year old female with multiple medical problems including lumbar spinal stenosis, RA, CHF, HTN, HLD. Palliative Care was asked to help address goals of care.   CODE STATUS: DNR  PPS: 50% HOSPICE ELIGIBILITY/DIAGNOSIS: TBD  PHYSICAL EXAM:   General: Patient lying in bed in NAD Cardiovascular: regular rate and rhythm Pulmonary: clear ant fields Abdomen: soft, nontender, + bowel sounds GU: no suprapubic tenderness Extremities: no edema, finger deformities noted consistent with RA Neurological: Weakness but otherwise nonfocal, A&Ox3  PAST MEDICAL HISTORY:  Past Medical History:  Diagnosis Date  . Allergic rhinitis due to pollen 04/27/2007  . Arthritis    "in q joint" (08/07/2013)  . CHF (congestive heart failure) (HCC)   . Coronary atherosclerosis of native coronary artery   . Cough   . Edema  05/03/2013  . Exertional shortness of breath    "sometimes" (08/07/2013)  . First degree atrioventricular block   . Gout, unspecified 04/19/2013  . Hyperlipidemia 04/27/2007  . Hypertension   . Midline low back pain without sciatica 06/27/2014  . Muscle spasm   . Muscle weakness (generalized)   . Onychia and paronychia of toe   . Osteoarthrosis, unspecified whether generalized or localized, unspecified site 04/27/2007  . Other fall   . Other malaise and fatigue   . Pneumonia 05/2018  . Prepatellar bursitis   . Pulmonary embolism (HCC) 07/2018  . Rheumatic nodule 06/28/2017  . Seizures (HCC)    . Stroke (HCC) 01/17/2014  . Type II diabetes mellitus (HCC)    "fasting 90-110s" (08/07/2013)  . Unspecified vitamin D deficiency     SOCIAL HX:  Social History   Tobacco Use  . Smoking status: Never Smoker  . Smokeless tobacco: Never Used  Substance Use Topics  . Alcohol use: No    Alcohol/week: 0.0 standard drinks    ALLERGIES:  Allergies  Allergen Reactions  . Clonidine Derivatives Swelling    [name]  . Fish Allergy Diarrhea, Swelling and Other (See Comments)    Turns skin "black," but can tolerate white fish Salmon- Diarrhea  . Shellfish Allergy Hives  . Doxycycline Rash  . Indomethacin Other (See Comments)    Reaction not recalled by the patient  . Lyrica [Pregabalin] Other (See Comments)    Hallucinations   . Methyldopa Other (See Comments)    Aldomet (for hypertension): Reaction not recalled by the patient  . Morphine And Related Other (See Comments)    Family reports it drops her bp that she needs iv fluids  . Orange Fruit [Citrus] Other (See Comments)    Indigestion/heartburn  . Strawberry (Diagnostic) Itching  . Cetirizine Hcl Itching and Rash  . Codeine Itching  . Levaquin [Levofloxacin In D5w] Rash  . Tomato Rash     PERTINENT MEDICATIONS:  Outpatient Encounter Medications as of 01/17/2019  Medication Sig  . albuterol (PROVENTIL HFA;VENTOLIN HFA) 108 (90 Base) MCG/ACT inhaler Inhale 1-2 puffs into the lungs every 6 (six) hours as needed for wheezing or shortness of breath.  . allopurinol (ZYLOPRIM) 100 MG tablet Take 1 tablet (100 mg total) by mouth at bedtime.  . carvedilol (COREG) 25 MG tablet Take 1 tablet (25 mg total) by mouth 2 (two) times daily with a meal.  . D3-1000 25 MCG (1000 UT) capsule Take 1 capsule (1,000 Units total) by mouth daily.  . diclofenac sodium (VOLTAREN) 1 % GEL Apply 2 g topically daily as needed (pain).  Marland Kitchen docusate sodium (COLACE) 100 MG capsule Take 1  capsule (100 mg total) by mouth daily as needed for mild constipation.  . ENSURE (ENSURE) Take 237 mLs by mouth every evening.  . furosemide (LASIX) 20 MG tablet Take 1 tablet (20 mg total) by mouth daily.  Marland Kitchen gabapentin (NEURONTIN) 400 MG capsule Take 1 capsule (400 mg total) by mouth 3 (three) times daily.  . hydrALAZINE (APRESOLINE) 100 MG tablet Take 1 tablet (100 mg total) by mouth 3 (three) times daily.  Marland Kitchen HYDROcodone-acetaminophen (NORCO/VICODIN) 5-325 MG tablet Take 1 tablet by mouth every 6 (six) hours.  . hydroxychloroquine (PLAQUENIL) 200 MG tablet Take 1 tablet (200 mg total) by mouth 2 (two) times daily.  . hydroxypropyl methylcellulose / hypromellose (ISOPTO TEARS / GONIOVISC) 2.5 % ophthalmic solution Place 1 drop into both eyes daily.  Marland Kitchen  isosorbide mononitrate (IMDUR) 30 MG 24 hr tablet Take 1 tablet (30 mg total) by mouth at bedtime.  Marland Kitchen leflunomide (ARAVA) 20 MG tablet Take 1 tablet (20 mg total) by mouth daily.  Marland Kitchen loratadine (CLARITIN) 10 MG tablet Take 1 tablet (10 mg total) by mouth daily.  . Melatonin 3 MG TABS Take 1 tablet (3 mg total) by mouth at bedtime.  . Olopatadine HCl 0.2 % SOLN Place 1 drop into both eyes daily.  . predniSONE (DELTASONE) 10 MG tablet 2/12 start 20 mg x 3 days then resume 10 mg on 2/15 (Patient taking differently: Take 10 mg by mouth daily. )  . rivaroxaban (XARELTO) 20 MG TABS tablet Take 1 tablet (20 mg total) by mouth daily with supper.  . sacubitril-valsartan (ENTRESTO) 49-51 MG Take 1 tablet by mouth daily at 12 noon.  . vitamin C (ASCORBIC ACID) 500 MG tablet Take 1 tablet (500 mg total) by mouth daily.   No facility-administered encounter medications on file as of 01/17/2019.       Addiel Mccardle Marlena Clipper, NP

## 2019-01-20 DIAGNOSIS — G458 Other transient cerebral ischemic attacks and related syndromes: Secondary | ICD-10-CM | POA: Diagnosis not present

## 2019-01-20 DIAGNOSIS — R195 Other fecal abnormalities: Secondary | ICD-10-CM | POA: Diagnosis not present

## 2019-01-20 DIAGNOSIS — M0689 Other specified rheumatoid arthritis, multiple sites: Secondary | ICD-10-CM | POA: Diagnosis not present

## 2019-01-20 DIAGNOSIS — I5041 Acute combined systolic (congestive) and diastolic (congestive) heart failure: Secondary | ICD-10-CM | POA: Diagnosis not present

## 2019-01-23 ENCOUNTER — Other Ambulatory Visit: Payer: Self-pay

## 2019-01-23 ENCOUNTER — Non-Acute Institutional Stay: Payer: Medicare Other | Admitting: Adult Health Nurse Practitioner

## 2019-01-23 ENCOUNTER — Telehealth: Payer: Self-pay | Admitting: Adult Health Nurse Practitioner

## 2019-01-23 DIAGNOSIS — Z515 Encounter for palliative care: Secondary | ICD-10-CM

## 2019-01-23 NOTE — Telephone Encounter (Signed)
Confirmed meeting at 2pm today.  Will meet in lobby as visitors are not allowed back to see patients at this time March Steyer K. Garner Nash

## 2019-01-23 NOTE — Progress Notes (Signed)
Therapist, nutritional Palliative Care Consult Note Telephone: [phone]  Fax: [phone]  PATIENT NAME: [name] [name] DOB: [birth year] MRN: [id number]  PRIMARY CARE PROVIDER:   Helane Rima, DO  REFERRING PROVIDER: Dr. Lily Kocher RESPONSIBLE PARTY:   [name], daughter, [phone]     RECOMMENDATIONS and PLAN:  1. Lumbar spinal stenosis.  Patient has severe spinal stenosis of lumbar spine and was getting therapy services to get strong enough to go through surgery.  Her plans are still to get strong enough to go through with the back surgery and be able to return home.  Daughter also states that the plan is for her to get strong enough to go through spinal surgery.  2.  Pain.  Patient has a lot of pain due to her spinal stenosis and sciatic pain.  [name] pain meds have been changed to lidocaine patches to hips and lower back daily, hydrocodone/APAP 5/325 half tab Q12 hrs, and tylenol 1000mg  Q8hr.  She also gets gabapentin 600 mg TID.  This regimen appears to be helping with the pain without making her so drowsy that she cannot participate with therapy.  Continue current pain regimen and continue to monitor for any uncontrolled pain or excessive drowsiness  3.  Goal of care.  Had meeting with daughter, [name], who is POA.  States that she wants her mother to be full code.  States that she and her family are in agreement that they want everything done that can be done but do not want her on prolonged life sustaining machines. Do want feeding tube for trial period.  MOST form left at facility  I spent 50 minutes providing this consultation,  from 2:00 to 2:50. More than 50% of the time in this consultation was spent coordinating communication.   HISTORY OF PRESENT ILLNESS:  [name] [name] is a 85 y.o. year old female with multiple medical problems including lumbar spinal stenosis, RA, CHF, HTN, HLD. Palliative Care was asked to help address  goals of care. Patient appears to be improving though she does state that the cold day is making her RA hurt.    CODE STATUS: Full Code  PPS: 50% HOSPICE ELIGIBILITY/DIAGNOSIS: TBD  PHYSICAL EXAM:   General: Patient lying in bed in NAD Cardiovascular: regular rate and rhythm Pulmonary: lung sounds clear, normal respiratory effort Abdomen: soft, nontender, + bowel sounds GU: no suprapubic tenderness Extremities: no edema, finger deformities noted consistent with RA Neurological: Weakness but otherwise nonfocal, A&Ox3  PAST MEDICAL HISTORY:  Past Medical History:  Diagnosis Date  . Allergic rhinitis due to pollen 04/27/2007  . Arthritis    "in q joint" (08/07/2013)  . CHF (congestive heart failure) (HCC)   . Coronary atherosclerosis of native coronary artery   . Cough   . Edema 05/03/2013  . Exertional shortness of breath    "sometimes" (08/07/2013)  . First degree atrioventricular block   . Gout, unspecified 04/19/2013  . Hyperlipidemia 04/27/2007  . Hypertension   . Midline low back pain without sciatica 06/27/2014  . Muscle spasm   . Muscle weakness (generalized)   . Onychia and paronychia of toe   . Osteoarthrosis, unspecified whether generalized or localized, unspecified site 04/27/2007  . Other fall   . Other malaise and fatigue   . Pneumonia 05/2018  . Prepatellar bursitis   . Pulmonary embolism (HCC) 07/2018  . Rheumatic nodule 06/28/2017  . Seizures (HCC)   . Stroke (HCC) 01/17/2014  . Type II diabetes  mellitus (HCC)    "fasting 90-110s" (08/07/2013)  . Unspecified vitamin D deficiency     SOCIAL HX:  Social History   Tobacco Use  . Smoking status: Never Smoker  . Smokeless tobacco: Never Used  Substance Use Topics  . Alcohol use: No    Alcohol/week: 0.0 standard drinks    ALLERGIES:  Allergies  Allergen Reactions  . Clonidine Derivatives Swelling    [name]  . Fish Allergy  Diarrhea, Swelling and Other (See Comments)    Turns skin "black," but can tolerate white fish Salmon- Diarrhea  . Shellfish Allergy Hives  . Doxycycline Rash  . Indomethacin Other (See Comments)    Reaction not recalled by the patient  . Lyrica [Pregabalin] Other (See Comments)    Hallucinations   . Methyldopa Other (See Comments)    Aldomet (for hypertension): Reaction not recalled by the patient  . Morphine And Related Other (See Comments)    Family reports it drops her bp that she needs iv fluids  . Orange Fruit [Citrus] Other (See Comments)    Indigestion/heartburn  . Strawberry (Diagnostic) Itching  . Cetirizine Hcl Itching and Rash  . Codeine Itching  . Levaquin [Levofloxacin In D5w] Rash  . Tomato Rash     PERTINENT MEDICATIONS:  Outpatient Encounter Medications as of 01/23/2019  Medication Sig  . albuterol (PROVENTIL HFA;VENTOLIN HFA) 108 (90 Base) MCG/ACT inhaler Inhale 1-2 puffs into the lungs every 6 (six) hours as needed for wheezing or shortness of breath.  . allopurinol (ZYLOPRIM) 100 MG tablet Take 1 tablet (100 mg total) by mouth at bedtime.  . carvedilol (COREG) 25 MG tablet Take 1 tablet (25 mg total) by mouth 2 (two) times daily with a meal.  . D3-1000 25 MCG (1000 UT) capsule Take 1 capsule (1,000 Units total) by mouth daily.  . diclofenac sodium (VOLTAREN) 1 % GEL Apply 2 g topically daily as needed (pain).  Marland Kitchen docusate sodium (COLACE) 100 MG capsule Take 1 capsule (100 mg total) by mouth daily as needed for mild constipation.  . ENSURE (ENSURE) Take 237 mLs by mouth every evening.  . furosemide (LASIX) 20 MG tablet Take 1 tablet (20 mg total) by mouth daily.  Marland Kitchen gabapentin (NEURONTIN) 400 MG capsule Take 1 capsule (400 mg total) by mouth 3 (three) times daily.  . hydrALAZINE (APRESOLINE) 100 MG tablet Take 1 tablet (100 mg total) by mouth 3 (three) times daily.  Marland Kitchen HYDROcodone-acetaminophen (NORCO/VICODIN) 5-325 MG tablet Take 1 tablet by mouth every 6 (six)  hours.  . hydroxychloroquine (PLAQUENIL) 200 MG tablet Take 1 tablet (200 mg total) by mouth 2 (two) times daily.  . hydroxypropyl methylcellulose / hypromellose (ISOPTO TEARS / GONIOVISC) 2.5 % ophthalmic solution Place 1 drop into both eyes daily.  . isosorbide mononitrate (IMDUR) 30 MG 24 hr tablet Take 1 tablet (30 mg total) by mouth at bedtime.  Marland Kitchen leflunomide (ARAVA) 20 MG tablet Take 1 tablet (20 mg total) by mouth daily.  Marland Kitchen loratadine (CLARITIN) 10 MG tablet Take 1 tablet (10 mg total) by mouth daily.  . Melatonin 3 MG TABS Take 1 tablet (3 mg total) by mouth at bedtime.  . Olopatadine HCl 0.2 % SOLN Place 1 drop into both eyes daily.  . predniSONE (DELTASONE) 10 MG tablet 2/12 start 20 mg x 3 days then resume 10 mg on 2/15 (Patient taking differently: Take 10 mg by mouth daily. )  . rivaroxaban (XARELTO) 20 MG  TABS tablet Take 1 tablet (20 mg total) by mouth daily with supper.  . sacubitril-valsartan (ENTRESTO) 49-51 MG Take 1 tablet by mouth daily at 12 noon.  . vitamin C (ASCORBIC ACID) 500 MG tablet Take 1 tablet (500 mg total) by mouth daily.   No facility-administered encounter medications on file as of 01/23/2019.      Ethie Curless Marlena Clipper, NP

## 2019-01-25 DIAGNOSIS — I10 Essential (primary) hypertension: Secondary | ICD-10-CM | POA: Diagnosis not present

## 2019-01-25 DIAGNOSIS — I5041 Acute combined systolic (congestive) and diastolic (congestive) heart failure: Secondary | ICD-10-CM | POA: Diagnosis not present

## 2019-01-25 DIAGNOSIS — R451 Restlessness and agitation: Secondary | ICD-10-CM | POA: Diagnosis not present

## 2019-01-25 DIAGNOSIS — G478 Other sleep disorders: Secondary | ICD-10-CM | POA: Diagnosis not present

## 2019-01-25 DIAGNOSIS — E568 Deficiency of other vitamins: Secondary | ICD-10-CM | POA: Diagnosis not present

## 2019-01-27 DIAGNOSIS — I5041 Acute combined systolic (congestive) and diastolic (congestive) heart failure: Secondary | ICD-10-CM | POA: Diagnosis not present

## 2019-01-27 DIAGNOSIS — M48061 Spinal stenosis, lumbar region without neurogenic claudication: Secondary | ICD-10-CM | POA: Diagnosis not present

## 2019-01-27 DIAGNOSIS — M0689 Other specified rheumatoid arthritis, multiple sites: Secondary | ICD-10-CM | POA: Diagnosis not present

## 2019-02-03 DIAGNOSIS — R05 Cough: Secondary | ICD-10-CM | POA: Diagnosis not present

## 2019-02-03 DIAGNOSIS — M48061 Spinal stenosis, lumbar region without neurogenic claudication: Secondary | ICD-10-CM | POA: Diagnosis not present

## 2019-02-06 DIAGNOSIS — M48061 Spinal stenosis, lumbar region without neurogenic claudication: Secondary | ICD-10-CM | POA: Diagnosis not present

## 2019-02-06 DIAGNOSIS — M0689 Other specified rheumatoid arthritis, multiple sites: Secondary | ICD-10-CM | POA: Diagnosis not present

## 2019-02-08 ENCOUNTER — Telehealth: Payer: Self-pay | Admitting: Family Medicine

## 2019-02-08 NOTE — Telephone Encounter (Signed)
Pt unable to schedule webex because she is in rehab center still and not allowed visitors.

## 2019-02-10 DIAGNOSIS — I130 Hypertensive heart and chronic kidney disease with heart failure and stage 1 through stage 4 chronic kidney disease, or unspecified chronic kidney disease: Secondary | ICD-10-CM | POA: Diagnosis not present

## 2019-02-10 DIAGNOSIS — M0689 Other specified rheumatoid arthritis, multiple sites: Secondary | ICD-10-CM | POA: Diagnosis not present

## 2019-02-10 DIAGNOSIS — N182 Chronic kidney disease, stage 2 (mild): Secondary | ICD-10-CM | POA: Diagnosis not present

## 2019-02-10 DIAGNOSIS — I5041 Acute combined systolic (congestive) and diastolic (congestive) heart failure: Secondary | ICD-10-CM | POA: Diagnosis not present

## 2019-02-10 DIAGNOSIS — M48061 Spinal stenosis, lumbar region without neurogenic claudication: Secondary | ICD-10-CM | POA: Diagnosis not present

## 2019-02-13 DIAGNOSIS — R451 Restlessness and agitation: Secondary | ICD-10-CM | POA: Diagnosis not present

## 2019-02-13 DIAGNOSIS — M48061 Spinal stenosis, lumbar region without neurogenic claudication: Secondary | ICD-10-CM | POA: Diagnosis not present

## 2019-02-13 DIAGNOSIS — G478 Other sleep disorders: Secondary | ICD-10-CM | POA: Diagnosis not present

## 2019-02-14 ENCOUNTER — Telehealth: Payer: Self-pay | Admitting: Adult Health Nurse Practitioner

## 2019-02-14 NOTE — Telephone Encounter (Signed)
Returning daughter's call.  Stated she had some concerns that she has not heard back from the facility about and was wondering if I had been in to see her lately.  Told her I had not been at the facility for about a week and a half and did not have any updates on her mother.  Did call facility and they are not letting providers in at this time.  Did let the daughter know and that we will work on setting up either a telehealth appointment or a telephone evaluation Brynlie Daza K. Garner Nash NP

## 2019-02-20 DIAGNOSIS — G8929 Other chronic pain: Secondary | ICD-10-CM | POA: Diagnosis not present

## 2019-02-20 DIAGNOSIS — M48061 Spinal stenosis, lumbar region without neurogenic claudication: Secondary | ICD-10-CM | POA: Diagnosis not present

## 2019-02-20 DIAGNOSIS — I82509 Chronic embolism and thrombosis of unspecified deep veins of unspecified lower extremity: Secondary | ICD-10-CM | POA: Diagnosis not present

## 2019-02-20 DIAGNOSIS — M0689 Other specified rheumatoid arthritis, multiple sites: Secondary | ICD-10-CM | POA: Diagnosis not present

## 2019-02-20 DIAGNOSIS — D638 Anemia in other chronic diseases classified elsewhere: Secondary | ICD-10-CM | POA: Diagnosis not present

## 2019-02-24 DIAGNOSIS — R5381 Other malaise: Secondary | ICD-10-CM | POA: Diagnosis not present

## 2019-02-24 DIAGNOSIS — R195 Other fecal abnormalities: Secondary | ICD-10-CM | POA: Diagnosis not present

## 2019-02-24 DIAGNOSIS — G478 Other sleep disorders: Secondary | ICD-10-CM | POA: Diagnosis not present

## 2019-02-27 DIAGNOSIS — M48061 Spinal stenosis, lumbar region without neurogenic claudication: Secondary | ICD-10-CM | POA: Diagnosis not present

## 2019-02-27 DIAGNOSIS — G894 Chronic pain syndrome: Secondary | ICD-10-CM | POA: Diagnosis not present

## 2019-02-27 DIAGNOSIS — M0689 Other specified rheumatoid arthritis, multiple sites: Secondary | ICD-10-CM | POA: Diagnosis not present

## 2019-03-01 ENCOUNTER — Telehealth: Payer: Self-pay | Admitting: *Deleted

## 2019-03-01 DIAGNOSIS — R195 Other fecal abnormalities: Secondary | ICD-10-CM | POA: Diagnosis not present

## 2019-03-01 DIAGNOSIS — M7501 Adhesive capsulitis of right shoulder: Secondary | ICD-10-CM | POA: Diagnosis not present

## 2019-03-01 DIAGNOSIS — M5410 Radiculopathy, site unspecified: Secondary | ICD-10-CM | POA: Diagnosis not present

## 2019-03-01 DIAGNOSIS — M0689 Other specified rheumatoid arthritis, multiple sites: Secondary | ICD-10-CM | POA: Diagnosis not present

## 2019-03-01 NOTE — Telephone Encounter (Signed)
Follow up    Pts daughter is calling back and would like for the nurse to return her call to discuss some things that are going on with the pt who is currently in a rehab   Please call

## 2019-03-01 NOTE — Telephone Encounter (Signed)
Follow    Pts daughter missed the call and is calling back   Please call

## 2019-03-01 NOTE — Telephone Encounter (Signed)
Left message to call back  

## 2019-03-01 NOTE — Telephone Encounter (Signed)
Spoke with daughter and patient currently in facility awaiting surgery. Per daughter she has declined since her last office visit and she would like her evaluated prior to surgery. No surgery scheduled at this time. Daughter will call back if any further issues

## 2019-03-01 NOTE — Telephone Encounter (Signed)
Left message for patient to call and schedule virtual/telephone 6 mos recall with Dr. [place] 

## 2019-03-06 DIAGNOSIS — M0689 Other specified rheumatoid arthritis, multiple sites: Secondary | ICD-10-CM | POA: Diagnosis not present

## 2019-03-06 DIAGNOSIS — I5041 Acute combined systolic (congestive) and diastolic (congestive) heart failure: Secondary | ICD-10-CM | POA: Diagnosis not present

## 2019-03-06 DIAGNOSIS — I129 Hypertensive chronic kidney disease with stage 1 through stage 4 chronic kidney disease, or unspecified chronic kidney disease: Secondary | ICD-10-CM | POA: Diagnosis not present

## 2019-03-06 DIAGNOSIS — D638 Anemia in other chronic diseases classified elsewhere: Secondary | ICD-10-CM | POA: Diagnosis not present

## 2019-03-07 ENCOUNTER — Telehealth: Payer: Self-pay | Admitting: Cardiovascular Disease

## 2019-03-08 ENCOUNTER — Telehealth: Payer: Self-pay | Admitting: Cardiovascular Disease

## 2019-03-08 NOTE — Telephone Encounter (Signed)
  Para March is her POA and she wants to be in on the call with Ms Oro who is her mother. She states she has not heard from the rehab center in regards to the appt and would like to speak with the nurse about this. She states her mom may not be lucid enough to do the visit

## 2019-03-08 NOTE — Telephone Encounter (Signed)
Spoke with daughter and facility nurses station [phone] director [phone] Will call regarding appointment tomorrow

## 2019-03-09 ENCOUNTER — Telehealth: Payer: Self-pay | Admitting: Family Medicine

## 2019-03-09 ENCOUNTER — Encounter: Payer: Medicare Other | Admitting: Cardiovascular Disease

## 2019-03-09 NOTE — Telephone Encounter (Signed)
Copied from CRM [id number]. Topic: Quick Communication - See Telephone Encounter >> Mar 09, 2019  3:09 PM Maia Petties wrote: CRM for notification. See Telephone encounter for: 03/09/19. Pts daughter, Para March, called in with great concern. She is worried about her mother being at [facility] for almost 2 months. Pt was supposed to have spinal surgery but it has been delayed due to COVID19. Pt not able to leave facility to attend doctor visits and if she leaves to go to ER she is not able to return to the facility. Para March states the pt is in crisis. She isn't able to due PT so she is having RA flares. She was supposed to have a telehealth visit with Cardiology but Para March isn't sure they were able to speak with the pt. She said pt is not eating well and what she gets is not on a heart healthy diet. Para March noted the pt is "wasting away in there". She wants to get her out of there but is equally afraid to take her to the ER for evaluation. If pt cannot go back to a facility she'll need around the clock nursing care at home. Para March is requesting call from Dr. Earlene Plater with advice.

## 2019-03-09 NOTE — Telephone Encounter (Signed)
See note

## 2019-03-09 NOTE — Telephone Encounter (Signed)
Spoke with facility and internet is down, unable to do visit at scheduled time. Will call back later in day to see if able to do at that point. Per daughter if unable to do visit may have to take to ED for evaluation

## 2019-03-09 NOTE — Telephone Encounter (Signed)
Spoke with patient's daughter after attempting to have a video visit with Ms. Choksi.  She is currently in a facility and they were unable to do video visits or phone visits at this time.  Her daughter is very concerned that she has been minimally responsive, not eating well, eating salty foods when she does eat, and in severe pain from her back.  She is awaiting spinal stenosis surgery.  Her daughter is concerned that she is deteriorating due to severe pain and not able to get out of bed.  There are no specific cardiovascular complaints at this time.  She is concerned because she needs cardiology clearance again before spinal stenosis surgery.  Per her facility rules she cannot be taken out of the facility due to COVID-19.  I advised that she insist on her mother being evaluated by the facility's MD.  If she is encephalopathic or her pain control is not adequate she may need to be seen in the ED.  The facility will not allow her to come to the office for evaluation.  Her daughter expressed appreciation and understanding.   Kaylob Wallen C. Duke Salvia, MD, [facility]  03/09/2019 5:17 PM

## 2019-03-09 NOTE — Progress Notes (Signed)
This encounter was created in error - please disregard.

## 2019-03-10 DIAGNOSIS — G894 Chronic pain syndrome: Secondary | ICD-10-CM | POA: Diagnosis not present

## 2019-03-10 DIAGNOSIS — I5041 Acute combined systolic (congestive) and diastolic (congestive) heart failure: Secondary | ICD-10-CM | POA: Diagnosis not present

## 2019-03-10 DIAGNOSIS — M48061 Spinal stenosis, lumbar region without neurogenic claudication: Secondary | ICD-10-CM | POA: Diagnosis not present

## 2019-03-10 DIAGNOSIS — M0689 Other specified rheumatoid arthritis, multiple sites: Secondary | ICD-10-CM | POA: Diagnosis not present

## 2019-03-12 DIAGNOSIS — M25551 Pain in right hip: Secondary | ICD-10-CM | POA: Diagnosis not present

## 2019-03-13 DIAGNOSIS — M0689 Other specified rheumatoid arthritis, multiple sites: Secondary | ICD-10-CM | POA: Diagnosis not present

## 2019-03-13 DIAGNOSIS — M5116 Intervertebral disc disorders with radiculopathy, lumbar region: Secondary | ICD-10-CM | POA: Diagnosis not present

## 2019-03-13 DIAGNOSIS — M25551 Pain in right hip: Secondary | ICD-10-CM | POA: Diagnosis not present

## 2019-03-14 NOTE — Telephone Encounter (Signed)
Called and schedule patient for appointment after they had to reschedule for Friday at 9:00. Daughter verbalized understanding.  Will route to nurse to make her aware.

## 2019-03-14 NOTE — Telephone Encounter (Signed)
Follow up   [name] is returning a call about setting up a virtual visit. Please call.

## 2019-03-15 DIAGNOSIS — M069 Rheumatoid arthritis, unspecified: Secondary | ICD-10-CM | POA: Diagnosis not present

## 2019-03-15 DIAGNOSIS — M109 Gout, unspecified: Secondary | ICD-10-CM | POA: Diagnosis not present

## 2019-03-15 DIAGNOSIS — N189 Chronic kidney disease, unspecified: Secondary | ICD-10-CM | POA: Diagnosis not present

## 2019-03-15 DIAGNOSIS — E1169 Type 2 diabetes mellitus with other specified complication: Secondary | ICD-10-CM | POA: Diagnosis not present

## 2019-03-16 ENCOUNTER — Telehealth: Payer: Self-pay | Admitting: Cardiovascular Disease

## 2019-03-16 NOTE — Telephone Encounter (Signed)
YOUR CARDIOLOGY TEAM HAS ARRANGED FOR AN E-VISIT FOR YOUR APPOINTMENT - PLEASE REVIEW IMPORTANT INFORMATION BELOW SEVERAL DAYS PRIOR TO YOUR APPOINTMENT   Due to the recent COVID-19 pandemic, we are transitioning in-person office visits to tele-medicine visits in an effort to decrease unnecessary exposure to our patients, their families, and staff. These visits are billed to your insurance just like a normal visit is. We also encourage you to sign up for MyChart if you have not already done so. You will need a smartphone if possible. For patients that do not have this, we can still complete the visit using a regular telephone but do prefer a smartphone to enable video when possible. You may have a family member that lives with you that can help. If possible, we also ask that you have a blood pressure cuff and scale at home to measure your blood pressure, heart rate and weight prior to your scheduled appointment. Patients with clinical needs that need an in-person evaluation and testing will still be able to come to the office if absolutely necessary. If you have any questions, feel free to call our office.    YOUR PROVIDER WILL BE USING DOXIMITY or [email or web address] - The staff will give you instructions on receiving your link to join the meeting the day of your visit.    THE DAY OF YOUR APPOINTMENT  Approximately 15 minutes prior to your scheduled appointment, you will receive a telephone call from one of [facility] team - your caller ID may say "Unknown caller."  Our staff will confirm medications, vital signs for the day and any symptoms you may be experiencing. Please have this information available prior to the time of visit start. It may also be helpful for you to have a pad of paper and pen handy for any instructions given during your visit. They will also walk you through joining the smartphone meeting if this is a video visit.    CONSENT FOR TELE-HEALTH VISIT - PLEASE REVIEW  I hereby voluntarily request,  consent and authorize [facility] and its employed or contracted physicians, physician assistants, nurse practitioners or other licensed health care professionals (the Practitioner), to provide me with telemedicine health care services (the "Services") as deemed necessary by the treating Practitioner. I acknowledge and consent to receive the Services by the Practitioner via telemedicine. I understand that the telemedicine visit will involve communicating with the Practitioner through live audiovisual communication technology and the disclosure of certain medical information by electronic transmission. I acknowledge that I have been given the opportunity to request an in-person assessment or other available alternative prior to the telemedicine visit and am voluntarily participating in the telemedicine visit.   I understand that I have the right to withhold or withdraw my consent to the use of telemedicine in the course of my care at any time, without affecting my right to future care or treatment, and that the Practitioner or I may terminate the telemedicine visit at any time. I understand that I have the right to inspect all information obtained and/or recorded in the course of the telemedicine visit and may receive copies of available information for a reasonable fee.  I understand that some of the potential risks of receiving the Services via telemedicine include:   Delay or interruption in medical evaluation due to technological equipment failure or disruption;  Information transmitted may not be sufficient (e.g. poor resolution of images) to allow for appropriate medical decision making by the Practitioner; and/or  In rare instances, security   protocols could fail, causing a breach of personal health information.   Furthermore, I acknowledge that it is my responsibility to provide information about my medical history, conditions and care that is complete and accurate to the best of my ability. I  acknowledge that Practitioner's advice, recommendations, and/or decision may be based on factors not within their control, such as incomplete or inaccurate data provided by me or distortions of diagnostic images or specimens that may result from electronic transmissions. I understand that the practice of medicine is not an exact science and that Practitioner makes no warranties or guarantees regarding treatment outcomes. I acknowledge that I will receive a copy of this consent concurrently upon execution via email to the email address I last provided but may also request a printed copy by calling the office of [facility].     I understand that my insurance will be billed for this visit.   I have read or had this consent read to me.  I understand the contents of this consent, which adequately explains the benefits and risks of the Services being provided via telemedicine.  I have been provided ample opportunity to ask questions regarding this consent and the Services and have had my questions answered to my satisfaction.  I give my informed consent for the services to be provided through the use of telemedicine in my medical care   By participating in this telemedicine visit I agree to the above.  

## 2019-03-16 NOTE — Telephone Encounter (Signed)
Patient is at [facility]/Daughter handles Mom affairs [phone] consent/ my chart via text to daughter's phone/ pre reg completed

## 2019-03-17 ENCOUNTER — Telehealth: Payer: Self-pay | Admitting: Cardiovascular Disease

## 2019-03-17 ENCOUNTER — Encounter: Payer: Self-pay | Admitting: Cardiovascular Disease

## 2019-03-17 ENCOUNTER — Telehealth (INDEPENDENT_AMBULATORY_CARE_PROVIDER_SITE_OTHER): Payer: Medicare Other | Admitting: Cardiovascular Disease

## 2019-03-17 DIAGNOSIS — I10 Essential (primary) hypertension: Secondary | ICD-10-CM

## 2019-03-17 DIAGNOSIS — I2510 Atherosclerotic heart disease of native coronary artery without angina pectoris: Secondary | ICD-10-CM

## 2019-03-17 DIAGNOSIS — I5032 Chronic diastolic (congestive) heart failure: Secondary | ICD-10-CM | POA: Diagnosis not present

## 2019-03-17 DIAGNOSIS — I825Z1 Chronic embolism and thrombosis of unspecified deep veins of right distal lower extremity: Secondary | ICD-10-CM | POA: Diagnosis not present

## 2019-03-17 NOTE — Telephone Encounter (Signed)
Returning your call tele visit with Dr.[place].  Thanks!

## 2019-03-17 NOTE — Telephone Encounter (Signed)
Reviewed AVS with daughter and sent copy of office note to Dr Netta Corrigan

## 2019-03-17 NOTE — Progress Notes (Signed)
Virtual Visit via Video Note   This visit type was conducted due to national recommendations for restrictions regarding the COVID-19 Pandemic (e.g. social distancing) in an effort to limit this patient's exposure and mitigate transmission in our community.  Due to her co-morbid illnesses, this patient is at least at moderate risk for complications without adequate follow up.  This format is felt to be most appropriate for this patient at this time.  All issues noted in this document were discussed and addressed.  A limited physical exam was performed with this format.  Please refer to the patient's chart for her consent to telehealth for [facility].   Date:  03/17/2019   ID:  [name] [name], DOB 04/20/1930, MRN [id number]  Patient Location: Home Provider Location: Office  PCP:  Helane Rima, DO  Cardiologist:  Chilton Si, MD  Electrophysiologist:  None   Evaluation Performed:  Follow-Up Visit  Chief Complaint:  Pre-surgical clearance  History of Present Illness:    [name] [name] is a 85 y.o. female with systolic and diastolic heart failure, hypertension, Rheumatoid Arthritis, diabetes type 2, CAD s/p MI, and CVA who presents for follow up.  She was initially seen in 2016 for management of chronic diastolic heart failure.  [name] [name] was admitted to the [facility] Medicine service 11/1-11/3 with chest pain.  At that time she reported constant chest pain that began in the setting of a cold.  CT scan of the chest was concerning for edema.  She received IV lasix and diuresed with improvement in her breathing.  She was also started on oral lasix.  At her initial appointment she was feeling well.  She was admitted 05/2018 with weakness, chest discomfort, and shortness of breath.  She was found to have pneumonia and heart failure.  BNP was also elevated to 2000 and troponin was mildly elevated to 0.06.  Echocardiogram 05/29/2018 revealed LVEF 20 to 25% and moderate pulmonary hypertension.   She underwent left and right heart catheterization and had no obstructive CAD.  [name] [name] was admitted 06/2018 with gallstone pancreatitis.  Cardiology was consulted and she was felt to be at acceptable risk for surgery.  She underwent laparoscopic cholecystectomy and recovered quite well. She was discharged on Lasix 40 mg but due to confusion was only taking 20 mg.  After increasing dose to 40 mg her weight returned to her baseline of 170 to 171 pounds.  Losartan was switched to [facility].  She had a follow up echo 08/2018 that revealed LVEF 60 to 65% with grade 1 diastolic dysfunction.  There is mild mitral stenosis with a mild to moderately calcified annulus. She was admitted 12/2018 for hand surgery and received cardiology clearance for both hand and cervical surgery.  Plans were made to hold Xarelto for 3 days with heparin bridging.  She was later admitted 01/2019 with encephalopathy and fever of unclear etiology.  Troponin was elevated to 0.04 and flat.  She had no chest pain.  She was discharged to a skilled nursing facility.  [name] [name] is currently feeling well.  Her main complaint is pain in her legs and feet.  She is unable to walk due to this pain.  She has minimal swelling in her feet that is stable and chronic.  She denies any shortness of breath, orthopnea or PND.  She has not experienced any chest pain.  She is mostly lying in the bed or sitting in a chair.  Her appetite has been poor.  The facility  reports that they are giving her nutritional supplements twice daily when she does not eat.  The patient does not have symptoms concerning for COVID-19 infection (fever, chills, cough, or new shortness of breath).    Past Medical History:  Diagnosis Date   Allergic rhinitis due to pollen 04/27/2007   Arthritis    "in q joint" (08/07/2013)   CHF (congestive heart failure) (HCC)    Coronary atherosclerosis of native coronary artery    Cough    Edema 05/03/2013   Exertional shortness  of breath    "sometimes" (08/07/2013)   First degree atrioventricular block    Gout, unspecified 04/19/2013   Hyperlipidemia 04/27/2007   Hypertension    Midline low back pain without sciatica 06/27/2014   Muscle spasm    Muscle weakness (generalized)    Onychia and paronychia of toe    Osteoarthrosis, unspecified whether generalized or localized, unspecified site 04/27/2007   Other fall    Other malaise and fatigue    Pneumonia 05/2018   Prepatellar bursitis    Pulmonary embolism (HCC) 07/2018   Rheumatic nodule 06/28/2017   Seizures (HCC)    Stroke (HCC) 01/17/2014   Type II diabetes mellitus (HCC)    "fasting 90-110s" (08/07/2013)   Unspecified vitamin D deficiency    Past Surgical History:  Procedure Laterality Date   ABDOMINAL HYSTERECTOMY  04/1980   APPENDECTOMY  1960   CARDIAC CATHETERIZATION  2003   CATARACT EXTRACTION W/ INTRAOCULAR LENS  IMPLANT, BILATERAL Bilateral ~ 2012   CHOLECYSTECTOMY N/A 06/26/2018   Procedure: LAPAROSCOPIC CHOLECYSTECTOMY POSSIBLE INTRAOPERATIVE CHOLANGIOGRAM;  Surgeon: Abigail Miyamoto, MD;  Location: [facility] OR;  Service: General;  Laterality: N/A;   JOINT REPLACEMENT     REPLACEMENT TOTAL KNEE Right 09/2005   RIGHT/LEFT HEART CATH AND CORONARY ANGIOGRAPHY N/A 06/01/2018   Procedure: RIGHT/LEFT HEART CATH AND CORONARY ANGIOGRAPHY;  Surgeon: Corky Crafts, MD;  Location: [facility] INVASIVE CV LAB;  Service: Cardiovascular;  Laterality: N/A;   SHOULDER OPEN ROTATOR CUFF REPAIR Right 07/1999   TONSILLECTOMY  08/1974   TOTAL KNEE ARTHROPLASTY Left 08/07/2013   Procedure: TOTAL KNEE ARTHROPLASTY- LEFT;  Surgeon: Dannielle Huh, MD;  Location: [facility] OR;  Service: Orthopedics;  Laterality: Left;   TRANSTHORACIC ECHOCARDIOGRAM  2003   EF 55-65%; mild concentric LVH     Current Meds  Medication Sig   acetaminophen (TYLENOL) 500 MG tablet Take 1,000 mg by mouth every 12 (twelve) hours.    albuterol (PROVENTIL HFA;VENTOLIN HFA) 108  (90 Base) MCG/ACT inhaler Inhale 1-2 puffs into the lungs every 6 (six) hours as needed for wheezing or shortness of breath.   allopurinol (ZYLOPRIM) 100 MG tablet Take 1 tablet (100 mg total) by mouth at bedtime.   carvedilol (COREG) 25 MG tablet Take 1 tablet (25 mg total) by mouth 2 (two) times daily with a meal.   D3-1000 25 MCG (1000 UT) capsule Take 1 capsule (1,000 Units total) by mouth daily.   docusate sodium (COLACE) 100 MG capsule Take 1 capsule (100 mg total) by mouth daily as needed for mild constipation.   ENSURE (ENSURE) Take 237 mLs by mouth every evening.   ferrous sulfate 325 (65 FE) MG tablet Take 325 mg by mouth every other day.   furosemide (LASIX) 20 MG tablet Take 1 tablet (20 mg total) by mouth daily.   gabapentin (NEURONTIN) 400 MG capsule Take 400 mg by mouth every morning. And 2 pm   gabapentin (NEURONTIN) 800 MG tablet Take 800 mg by mouth every  evening.   guaiFENesin (MUCINEX) 600 MG 12 hr tablet Take by mouth 2 (two) times daily as needed for cough.   hydrALAZINE (APRESOLINE) 100 MG tablet Take 1 tablet (100 mg total) by mouth 3 (three) times daily.   HYDROcodone-acetaminophen (NORCO/VICODIN) 5-325 MG tablet Take 1 tablet by mouth every 6 (six) hours.   HYDROcodone-acetaminophen (NORCO/VICODIN) 5-325 MG tablet Take 1 tablet by mouth as directed. 1/2 TABLET EVERY 12 HOURS   hydroxychloroquine (PLAQUENIL) 200 MG tablet Take 1 tablet (200 mg total) by mouth 2 (two) times daily.   hydroxypropyl methylcellulose / hypromellose (ISOPTO TEARS / GONIOVISC) 2.5 % ophthalmic solution Place 1 drop into both eyes daily.   isosorbide mononitrate (IMDUR) 30 MG 24 hr tablet Take 1 tablet (30 mg total) by mouth at bedtime.   lactose free nutrition (BOOST) LIQD Take 237 mLs by mouth daily.   leflunomide (ARAVA) 20 MG tablet Take 1 tablet (20 mg total) by mouth daily.   Lidocaine (ASPERCREME LIDOCAINE) 4 % PTCH Apply topically daily.   loratadine (CLARITIN) 10 MG  tablet Take 1 tablet (10 mg total) by mouth daily.   LORazepam (ATIVAN) 0.5 MG tablet Take 0.5 mg by mouth as directed. 1/2 TABLET DAILY AS NEEDED FOR ANXIETY   Melatonin 3 MG TABS Take 3 mg by mouth as directed. 2 AT BEDTIME   Olopatadine HCl 0.2 % SOLN Place 1 drop into both eyes daily.   potassium chloride SA (K-DUR) 20 MEQ tablet Take 20 mEq by mouth as directed. TWICE A WEEK   predniSONE (DELTASONE) 10 MG tablet Take 10 mg by mouth daily with breakfast.   rivaroxaban (XARELTO) 20 MG TABS tablet Take 1 tablet (20 mg total) by mouth daily with supper.   saccharomyces boulardii (FLORASTOR) 250 MG capsule Take 250 mg by mouth every other day.   sacubitril-valsartan (ENTRESTO) 49-51 MG Take 1 tablet by mouth daily at 12 noon.   sitaGLIPtin (JANUVIA) 25 MG tablet Take 25 mg by mouth as directed. 1/2 tablet daily   vitamin C (ASCORBIC ACID) 500 MG tablet Take 1 tablet (500 mg total) by mouth daily.     Allergies:   Clonidine derivatives; Fish allergy; Shellfish allergy; Doxycycline; Indomethacin; Lyrica [pregabalin]; Methyldopa; Morphine and related; Orange fruit [citrus]; Strawberry (diagnostic); Cetirizine hcl; Codeine; Levaquin [levofloxacin in d5w]; and Tomato   Social History   Tobacco Use   Smoking status: Never Smoker   Smokeless tobacco: Never Used  Substance Use Topics   Alcohol use: No    Alcohol/week: 0.0 standard drinks   Drug use: No     Family Hx: The patient's family history includes Heart attack in her father.  ROS:   Please see the history of present illness.    All other systems reviewed and are negative.   Prior CV studies:   The following studies were reviewed today:  LHC 06/01/18:  LV end diastolic pressure is moderately elevated.  There is no aortic valve stenosis.  Hemodynamic findings consistent with moderate pulmonary hypertension.  CO 5.68 L/min; CI 2.97; Ao sat 99%; PA sat 65%; mean PA 42 mm Hg; mean PCWP 28 mm Hg  Nonobstructive  CAD.  Nonischemic cardiomyopathy with minimal to mild CAD.   Echo 05/29/18: Study Conclusions  - Left ventricle: The cavity size was normal. Wall thickness was increased in a pattern of moderate LVH. Systolic function was severely reduced. The estimated ejection fraction was in the range of 20% to 25%. Although no diagnostic regional wall motion abnormality was identified, this  possibility cannot be completely excluded on the basis of this study. - Aortic valve: Mildly calcified annulus. Trileaflet; normal thickness, moderately calcified leaflets. There was mild regurgitation. - Mitral valve: Mildly calcified annulus. Mildly calcified leaflets . There was mild to moderate regurgitation. - Left atrium: The atrium was moderately to severely dilated. - Right ventricle: The cavity size was mildly dilated. Wall thickness was normal. Systolic function was mildly reduced. - Right atrium: The atrium was moderately dilated. - Atrial septum: There was a small patent foramen ovale. There was a left-to-right shunt. - Tricuspid valve: There was moderate regurgitation. - Pulmonary arteries: Systolic pressure was moderately increased. PA peak pressure: 47 mm Hg (S). - Pericardium, extracardiac: A trivial pericardial effusion was identified.  Echo 01/18/14: Study Conclusions  - Left ventricle: The cavity size was normal. Wall thickness was increased in a pattern of moderate to severe LVH. There was moderate focal basal hypertrophy of the septum. Systolic function was normal. The estimated ejection fraction was in the range of 60% to 65%. Wall motion was normal; there were no regional wall motion abnormalities. Doppler parameters are consistent with abnormal left ventricular relaxation (grade 1 diastolic dysfunction). - Left atrium: The atrium was moderately dilated. - Pulmonary arteries: Systolic pressure was mildly increased.  Carotid  ultrasound 01/18/14: Summary: Bilateral: intimal wall thickening CCA. 1-39% ICA stenosis. Vertebral artery flow is antegrade.  Labs/Other Tests and Data Reviewed:    EKG:  An ECG dated 01/11/19 was personally reviewed today and demonstrated:  sinus tachycardia.  Rate 106 bpm.  Lead reversal.  Recent Labs: 11/15/2018: TSH 1.095 01/09/2019: B Natriuretic Peptide 2,955.1 01/11/2019: ALT 15 01/12/2019: BUN 27; Creatinine, Ser 1.17; Hemoglobin 7.9; Magnesium 1.9; Platelets 188; Potassium 3.2; Sodium 141   Recent Lipid Panel Lab Results  Component Value Date/Time   CHOL 202 (H) 06/25/2018 06:07 AM   CHOL 185 03/04/2016 04:20 PM   TRIG 67 06/25/2018 06:07 AM   HDL 63 06/25/2018 06:07 AM   HDL 73 03/04/2016 04:20 PM   CHOLHDL 3.2 06/25/2018 06:07 AM   LDLCALC 126 (H) 06/25/2018 06:07 AM   LDLCALC 89 03/04/2016 04:20 PM    Wt Readings from Last 3 Encounters:  03/17/19 178 lb 6.4 oz (80.9 kg)  01/12/19 191 lb 5.8 oz (86.8 kg)  12/21/18 185 lb (83.9 kg)     Objective:    BP 112/64    Pulse 82    Temp 98.6 F (37 C)    Resp 18    Wt 178 lb 6.4 oz (80.9 kg)    SpO2 96%    BMI 30.62 kg/m  GENERAL: Well-appearing.  No acute distress. HEENT: Pupils equal round.  Oral mucosa unremarkable NECK:  No jugular venous distention, no visible thyromegaly EXT:  No edema, no cyanosis no clubbing SKIN:  No rashes no nodules NEURO:  Speech fluent.  Cranial nerves grossly intact.  Moves all 4 extremities freely PSYCH:  Cognitively intact, oriented to person place and time   ASSESSMENT & PLAN:    # Chronic diastolic heart failure: Systolic function has returned to normal.  She is euvolemic and has no heart failure symptoms. Continue carvedilol, hydralazine, isosorbide mononitrate, lasix, and Entresto.    # Hypertension: BP well-controlled.  Continue Entresto, carvedilol, hydralazine, and Imdur.  # Chest pain: Resolved.  No CAD on cath.   # CAD s/p MI: [name] [name] reportedly had an MI in the 31s.  She had minimal, non-obstructive disease on cath 05/2018.    Continue carvedilol.  She  is not interested in a statin.   # PE: [name] [name] had a PE 07/2018 soon after having pneumonia.  She is on Xarelto.  Will likely discontinue in 6 month presuming she is more active at that time.  It has been recommended that she be bridged with either heparin or lovenox 3 days prior to surgery.  # Surgical risk assessment: The patient does not have any unstable cardiac conditions.  Upon evaluation today, she cannot achieve 4 METs or greater due to pain.  However, within the last year she has had an echocardiogram that showed normal systolic function and she has no evidence of heart failure symptoms at this time.  Additionally, she had a left heart cath that showed no significant CAD on 05/2018.  Her biggest risk factors are age and frailty.  She is at acceptable cardiovascular risk for surgery.  Recommend holding Xarelto for 3 days prior to surgery and bridging with either heparin or Lovenox.   COVID-19 Education: The signs and symptoms of COVID-19 were discussed with the patient and how to seek care for testing (follow up with PCP or arrange E-visit).  The importance of social distancing was discussed today.  Time:   Today, I have spent 22 minutes with the patient with telehealth technology discussing the above problems.     Medication Adjustments/Labs and Tests Ordered: Current medicines are reviewed at length with the patient today.  Concerns regarding medicines are outlined above.   Tests Ordered: No orders of the defined types were placed in this encounter.   Medication Changes: No orders of the defined types were placed in this encounter.   Disposition:  Follow up in 6 months  Signed, Chilton Si, MD  03/17/2019 12:57 PM    [facility]

## 2019-03-17 NOTE — Patient Instructions (Addendum)
Medication Instructions:  Your physician recommends that you continue on your current medications as directed. Please refer to the Current Medication list given to you today.  If you need a refill on your cardiac medications before your next appointment, please call your pharmacy.   Lab work: NONE  Testing/Procedures: NONE  Follow-Up: At [facility], you and your health needs are our priority.  As part of our continuing mission to provide you with exceptional heart care, we have created designated Provider Care Teams.  These Care Teams include your primary Cardiologist (physician) and Advanced Practice Providers (APPs -  Physician Assistants and Nurse Practitioners) who all work together to provide you with the care you need, when you need it. You will need a follow up appointment in 6 months.  Please call our office 2 months in advance to schedule this appointment.  You may see Chilton Si, MD or one of the following Advanced Practice Providers on your designated Care Team:   Corine Shelter, PA-C Judy Pimple, [place] . Marjie Skiff, PA-C  Any Other Special Instructions Will Be Listed Below (If Applicable).  YOU CLEARED FOR UPCOMING SURGERY

## 2019-03-17 NOTE — Telephone Encounter (Signed)
New Message    Pts daugther is returning the call for melinda   Please call back

## 2019-03-21 DIAGNOSIS — I5040 Unspecified combined systolic (congestive) and diastolic (congestive) heart failure: Secondary | ICD-10-CM | POA: Diagnosis not present

## 2019-03-21 DIAGNOSIS — M6281 Muscle weakness (generalized): Secondary | ICD-10-CM | POA: Diagnosis not present

## 2019-03-21 DIAGNOSIS — M48061 Spinal stenosis, lumbar region without neurogenic claudication: Secondary | ICD-10-CM | POA: Diagnosis not present

## 2019-03-21 DIAGNOSIS — M06831 Other specified rheumatoid arthritis, right wrist: Secondary | ICD-10-CM | POA: Diagnosis not present

## 2019-03-22 DIAGNOSIS — I5040 Unspecified combined systolic (congestive) and diastolic (congestive) heart failure: Secondary | ICD-10-CM | POA: Diagnosis not present

## 2019-03-22 DIAGNOSIS — M48061 Spinal stenosis, lumbar region without neurogenic claudication: Secondary | ICD-10-CM | POA: Diagnosis not present

## 2019-03-22 DIAGNOSIS — M06831 Other specified rheumatoid arthritis, right wrist: Secondary | ICD-10-CM | POA: Diagnosis not present

## 2019-03-22 DIAGNOSIS — M6281 Muscle weakness (generalized): Secondary | ICD-10-CM | POA: Diagnosis not present

## 2019-03-23 DIAGNOSIS — M6281 Muscle weakness (generalized): Secondary | ICD-10-CM | POA: Diagnosis not present

## 2019-03-23 DIAGNOSIS — M48061 Spinal stenosis, lumbar region without neurogenic claudication: Secondary | ICD-10-CM | POA: Diagnosis not present

## 2019-03-23 DIAGNOSIS — I5040 Unspecified combined systolic (congestive) and diastolic (congestive) heart failure: Secondary | ICD-10-CM | POA: Diagnosis not present

## 2019-03-23 DIAGNOSIS — M06831 Other specified rheumatoid arthritis, right wrist: Secondary | ICD-10-CM | POA: Diagnosis not present

## 2019-03-23 NOTE — Telephone Encounter (Signed)
See note

## 2019-03-23 NOTE — Telephone Encounter (Signed)
Pt's daughter is calling back and stating her mom is still having groin pain, Rheumatoid and needing spinal surgery. Daughter is still concern and stated no one has called her back from previous message. She wish to speak to Dr Earlene Plater for advice on what she need to do to get her mom the care that she need.

## 2019-03-24 DIAGNOSIS — R451 Restlessness and agitation: Secondary | ICD-10-CM | POA: Diagnosis not present

## 2019-03-24 DIAGNOSIS — G894 Chronic pain syndrome: Secondary | ICD-10-CM | POA: Diagnosis not present

## 2019-03-24 DIAGNOSIS — I5040 Unspecified combined systolic (congestive) and diastolic (congestive) heart failure: Secondary | ICD-10-CM | POA: Diagnosis not present

## 2019-03-24 DIAGNOSIS — M06831 Other specified rheumatoid arthritis, right wrist: Secondary | ICD-10-CM | POA: Diagnosis not present

## 2019-03-24 DIAGNOSIS — M48061 Spinal stenosis, lumbar region without neurogenic claudication: Secondary | ICD-10-CM | POA: Diagnosis not present

## 2019-03-24 DIAGNOSIS — M6281 Muscle weakness (generalized): Secondary | ICD-10-CM | POA: Diagnosis not present

## 2019-03-24 DIAGNOSIS — G478 Other sleep disorders: Secondary | ICD-10-CM | POA: Diagnosis not present

## 2019-03-24 NOTE — Telephone Encounter (Signed)
Please advise 

## 2019-03-25 DIAGNOSIS — M6281 Muscle weakness (generalized): Secondary | ICD-10-CM | POA: Diagnosis not present

## 2019-03-25 DIAGNOSIS — I5040 Unspecified combined systolic (congestive) and diastolic (congestive) heart failure: Secondary | ICD-10-CM | POA: Diagnosis not present

## 2019-03-25 DIAGNOSIS — M48061 Spinal stenosis, lumbar region without neurogenic claudication: Secondary | ICD-10-CM | POA: Diagnosis not present

## 2019-03-25 DIAGNOSIS — M06831 Other specified rheumatoid arthritis, right wrist: Secondary | ICD-10-CM | POA: Diagnosis not present

## 2019-03-27 ENCOUNTER — Encounter: Payer: Self-pay | Admitting: Physician Assistant

## 2019-03-27 DIAGNOSIS — M6281 Muscle weakness (generalized): Secondary | ICD-10-CM | POA: Diagnosis not present

## 2019-03-27 DIAGNOSIS — M069 Rheumatoid arthritis, unspecified: Secondary | ICD-10-CM

## 2019-03-27 DIAGNOSIS — I5040 Unspecified combined systolic (congestive) and diastolic (congestive) heart failure: Secondary | ICD-10-CM | POA: Diagnosis not present

## 2019-03-27 DIAGNOSIS — M48061 Spinal stenosis, lumbar region without neurogenic claudication: Secondary | ICD-10-CM | POA: Diagnosis not present

## 2019-03-27 DIAGNOSIS — M06831 Other specified rheumatoid arthritis, right wrist: Secondary | ICD-10-CM | POA: Diagnosis not present

## 2019-03-28 DIAGNOSIS — M6281 Muscle weakness (generalized): Secondary | ICD-10-CM | POA: Diagnosis not present

## 2019-03-28 DIAGNOSIS — M06831 Other specified rheumatoid arthritis, right wrist: Secondary | ICD-10-CM | POA: Diagnosis not present

## 2019-03-28 DIAGNOSIS — M48061 Spinal stenosis, lumbar region without neurogenic claudication: Secondary | ICD-10-CM | POA: Diagnosis not present

## 2019-03-28 DIAGNOSIS — I5040 Unspecified combined systolic (congestive) and diastolic (congestive) heart failure: Secondary | ICD-10-CM | POA: Diagnosis not present

## 2019-03-29 ENCOUNTER — Other Ambulatory Visit: Payer: Self-pay

## 2019-03-29 ENCOUNTER — Telehealth: Payer: Self-pay | Admitting: Family Medicine

## 2019-03-29 ENCOUNTER — Emergency Department (HOSPITAL_COMMUNITY): Payer: Medicare Other

## 2019-03-29 ENCOUNTER — Inpatient Hospital Stay (HOSPITAL_COMMUNITY)
Admission: EM | Admit: 2019-03-29 | Discharge: 2019-04-03 | DRG: 091 | Disposition: A | Discharge status: Home Health | Payer: Medicare Other | Attending: Internal Medicine | Admitting: Internal Medicine

## 2019-03-29 DIAGNOSIS — G9341 Metabolic encephalopathy: Secondary | ICD-10-CM | POA: Diagnosis not present

## 2019-03-29 DIAGNOSIS — I10 Essential (primary) hypertension: Secondary | ICD-10-CM | POA: Diagnosis not present

## 2019-03-29 DIAGNOSIS — I5042 Chronic combined systolic (congestive) and diastolic (congestive) heart failure: Secondary | ICD-10-CM | POA: Diagnosis not present

## 2019-03-29 DIAGNOSIS — I130 Hypertensive heart and chronic kidney disease with heart failure and stage 1 through stage 4 chronic kidney disease, or unspecified chronic kidney disease: Secondary | ICD-10-CM | POA: Diagnosis present

## 2019-03-29 DIAGNOSIS — Z8249 Family history of ischemic heart disease and other diseases of the circulatory system: Secondary | ICD-10-CM

## 2019-03-29 DIAGNOSIS — G9340 Encephalopathy, unspecified: Secondary | ICD-10-CM | POA: Diagnosis present

## 2019-03-29 DIAGNOSIS — R030 Elevated blood-pressure reading, without diagnosis of hypertension: Secondary | ICD-10-CM | POA: Diagnosis not present

## 2019-03-29 DIAGNOSIS — M069 Rheumatoid arthritis, unspecified: Secondary | ICD-10-CM | POA: Diagnosis present

## 2019-03-29 DIAGNOSIS — Z1159 Encounter for screening for other viral diseases: Secondary | ICD-10-CM | POA: Diagnosis not present

## 2019-03-29 DIAGNOSIS — G92 Toxic encephalopathy: Secondary | ICD-10-CM | POA: Diagnosis not present

## 2019-03-29 DIAGNOSIS — E871 Hypo-osmolality and hyponatremia: Secondary | ICD-10-CM | POA: Diagnosis present

## 2019-03-29 DIAGNOSIS — R569 Unspecified convulsions: Secondary | ICD-10-CM | POA: Diagnosis present

## 2019-03-29 DIAGNOSIS — M0689 Other specified rheumatoid arthritis, multiple sites: Secondary | ICD-10-CM | POA: Diagnosis not present

## 2019-03-29 DIAGNOSIS — E785 Hyperlipidemia, unspecified: Secondary | ICD-10-CM | POA: Diagnosis present

## 2019-03-29 DIAGNOSIS — I824Z1 Acute embolism and thrombosis of unspecified deep veins of right distal lower extremity: Secondary | ICD-10-CM | POA: Diagnosis not present

## 2019-03-29 DIAGNOSIS — M109 Gout, unspecified: Secondary | ICD-10-CM | POA: Diagnosis present

## 2019-03-29 DIAGNOSIS — R5381 Other malaise: Secondary | ICD-10-CM | POA: Diagnosis not present

## 2019-03-29 DIAGNOSIS — E1151 Type 2 diabetes mellitus with diabetic peripheral angiopathy without gangrene: Secondary | ICD-10-CM | POA: Diagnosis present

## 2019-03-29 DIAGNOSIS — Z86711 Personal history of pulmonary embolism: Secondary | ICD-10-CM | POA: Diagnosis not present

## 2019-03-29 DIAGNOSIS — R9431 Abnormal electrocardiogram [ECG] [EKG]: Secondary | ICD-10-CM | POA: Diagnosis present

## 2019-03-29 DIAGNOSIS — M797 Fibromyalgia: Secondary | ICD-10-CM | POA: Diagnosis present

## 2019-03-29 DIAGNOSIS — M545 Low back pain: Secondary | ICD-10-CM | POA: Diagnosis not present

## 2019-03-29 DIAGNOSIS — Z888 Allergy status to other drugs, medicaments and biological substances status: Secondary | ICD-10-CM

## 2019-03-29 DIAGNOSIS — Z9841 Cataract extraction status, right eye: Secondary | ICD-10-CM

## 2019-03-29 DIAGNOSIS — T424X5A Adverse effect of benzodiazepines, initial encounter: Secondary | ICD-10-CM | POA: Diagnosis present

## 2019-03-29 DIAGNOSIS — L89613 Pressure ulcer of right heel, stage 3: Secondary | ICD-10-CM | POA: Diagnosis present

## 2019-03-29 DIAGNOSIS — I2510 Atherosclerotic heart disease of native coronary artery without angina pectoris: Secondary | ICD-10-CM | POA: Diagnosis present

## 2019-03-29 DIAGNOSIS — D638 Anemia in other chronic diseases classified elsewhere: Secondary | ICD-10-CM | POA: Diagnosis present

## 2019-03-29 DIAGNOSIS — G8929 Other chronic pain: Secondary | ICD-10-CM | POA: Diagnosis present

## 2019-03-29 DIAGNOSIS — Z91013 Allergy to seafood: Secondary | ICD-10-CM

## 2019-03-29 DIAGNOSIS — Z961 Presence of intraocular lens: Secondary | ICD-10-CM | POA: Diagnosis present

## 2019-03-29 DIAGNOSIS — Z96653 Presence of artificial knee joint, bilateral: Secondary | ICD-10-CM | POA: Diagnosis present

## 2019-03-29 DIAGNOSIS — E1129 Type 2 diabetes mellitus with other diabetic kidney complication: Secondary | ICD-10-CM | POA: Diagnosis present

## 2019-03-29 DIAGNOSIS — Z86718 Personal history of other venous thrombosis and embolism: Secondary | ICD-10-CM

## 2019-03-29 DIAGNOSIS — E1165 Type 2 diabetes mellitus with hyperglycemia: Secondary | ICD-10-CM | POA: Diagnosis present

## 2019-03-29 DIAGNOSIS — I110 Hypertensive heart disease with heart failure: Secondary | ICD-10-CM | POA: Diagnosis present

## 2019-03-29 DIAGNOSIS — Z9842 Cataract extraction status, left eye: Secondary | ICD-10-CM

## 2019-03-29 DIAGNOSIS — E1122 Type 2 diabetes mellitus with diabetic chronic kidney disease: Secondary | ICD-10-CM | POA: Diagnosis present

## 2019-03-29 DIAGNOSIS — N183 Chronic kidney disease, stage 3 unspecified: Secondary | ICD-10-CM | POA: Diagnosis present

## 2019-03-29 DIAGNOSIS — Z79891 Long term (current) use of opiate analgesic: Secondary | ICD-10-CM | POA: Diagnosis not present

## 2019-03-29 DIAGNOSIS — M549 Dorsalgia, unspecified: Secondary | ICD-10-CM | POA: Diagnosis present

## 2019-03-29 DIAGNOSIS — E1121 Type 2 diabetes mellitus with diabetic nephropathy: Secondary | ICD-10-CM | POA: Diagnosis present

## 2019-03-29 DIAGNOSIS — M48061 Spinal stenosis, lumbar region without neurogenic claudication: Secondary | ICD-10-CM | POA: Diagnosis present

## 2019-03-29 DIAGNOSIS — Z79899 Other long term (current) drug therapy: Secondary | ICD-10-CM

## 2019-03-29 DIAGNOSIS — R531 Weakness: Secondary | ICD-10-CM | POA: Diagnosis not present

## 2019-03-29 DIAGNOSIS — I825Z1 Chronic embolism and thrombosis of unspecified deep veins of right distal lower extremity: Secondary | ICD-10-CM | POA: Diagnosis not present

## 2019-03-29 DIAGNOSIS — Z8673 Personal history of transient ischemic attack (TIA), and cerebral infarction without residual deficits: Secondary | ICD-10-CM

## 2019-03-29 DIAGNOSIS — Z7952 Long term (current) use of systemic steroids: Secondary | ICD-10-CM

## 2019-03-29 DIAGNOSIS — T426X5A Adverse effect of other antiepileptic and sedative-hypnotic drugs, initial encounter: Secondary | ICD-10-CM | POA: Diagnosis not present

## 2019-03-29 DIAGNOSIS — Z7901 Long term (current) use of anticoagulants: Secondary | ICD-10-CM

## 2019-03-29 DIAGNOSIS — R400 Somnolence: Secondary | ICD-10-CM | POA: Diagnosis not present

## 2019-03-29 DIAGNOSIS — I420 Dilated cardiomyopathy: Secondary | ICD-10-CM | POA: Diagnosis present

## 2019-03-29 DIAGNOSIS — Z6830 Body mass index (BMI) 30.0-30.9, adult: Secondary | ICD-10-CM

## 2019-03-29 DIAGNOSIS — E118 Type 2 diabetes mellitus with unspecified complications: Secondary | ICD-10-CM | POA: Diagnosis not present

## 2019-03-29 LAB — BASIC METABOLIC PANEL
Anion gap: 9 (ref 5–15)
Anion gap: 10 (ref 5–15)
BUN: 16 mg/dL (ref 8–23)
BUN: 16 mg/dL (ref 8–23)
CO2: 22 mmol/L (ref 22–32)
CO2: 23 mmol/L (ref 22–32)
Calcium: 8.3 mg/dL — ABNORMAL LOW (ref 8.9–10.3)
Calcium: 8.6 mg/dL — ABNORMAL LOW (ref 8.9–10.3)
Chloride: 92 mmol/L — ABNORMAL LOW (ref 98–111)
Chloride: 90 mmol/L — ABNORMAL LOW (ref 98–111)
Creatinine, Ser: 0.9 mg/dL (ref 0.44–1.00)
Creatinine, Ser: 0.81 mg/dL (ref 0.44–1.00)
GFR calc Af Amer: >60 mL/min (ref >60)
GFR calc Af Amer: >60 mL/min (ref >60)
GFR calc non Af Amer: 58 mL/min — ABNORMAL LOW (ref >60)
GFR calc non Af Amer: >60 mL/min (ref >60)
Glucose, Bld: 122 mg/dL — ABNORMAL HIGH (ref 70–99)
Glucose, Bld: 111 mg/dL — ABNORMAL HIGH (ref 70–99)
Potassium: 4.9 mmol/L (ref 3.5–5.1)
Potassium: 4.8 mmol/L (ref 3.5–5.1)
Sodium: 123 mmol/L — ABNORMAL LOW (ref 135–145)
Sodium: 123 mmol/L — ABNORMAL LOW (ref 135–145)

## 2019-03-29 LAB — CBC WITH DIFFERENTIAL/PLATELET
Abs Immature Granulocytes: 0.03 10*3/uL (ref 0.00–0.07)
Basophils Absolute: 0.1 10*3/uL (ref 0.0–0.1)
Basophils Relative: 1 %
Eosinophils Absolute: 0.1 10*3/uL (ref 0.0–0.5)
Eosinophils Relative: 1 %
HCT: 25.5 % — ABNORMAL LOW (ref 36.0–46.0)
Hemoglobin: 8.5 g/dL — ABNORMAL LOW (ref 12.0–15.0)
Immature Granulocytes: 0 %
Lymphocytes Relative: 9 %
Lymphs Abs: 0.8 10*3/uL (ref 0.7–4.0)
MCH: 30.8 pg (ref 26.0–34.0)
MCHC: 33.3 g/dL (ref 30.0–36.0)
MCV: 92.4 fL (ref 80.0–100.0)
Monocytes Absolute: 0.3 10*3/uL (ref 0.1–1.0)
Monocytes Relative: 4 %
Neutro Abs: 7.8 10*3/uL — ABNORMAL HIGH (ref 1.7–7.7)
Neutrophils Relative %: 85 %
Platelets: 231 10*3/uL (ref 150–400)
RBC: 2.76 MIL/uL — ABNORMAL LOW (ref 3.87–5.11)
RDW: 17.9 % — ABNORMAL HIGH (ref 11.5–15.5)
WBC: 9.1 10*3/uL (ref 4.0–10.5)
nRBC: 0 % (ref 0.0–0.2)

## 2019-03-29 LAB — URINALYSIS, ROUTINE W REFLEX MICROSCOPIC
Bacteria, UA: NONE SEEN
Bilirubin Urine: NEGATIVE
Glucose, UA: NEGATIVE mg/dL
Hgb urine dipstick: NEGATIVE
Ketones, ur: NEGATIVE mg/dL
Nitrite: NEGATIVE
Protein, ur: NEGATIVE mg/dL
Specific Gravity, Urine: 1.005 (ref 1.005–1.030)
pH: 7 (ref 5.0–8.0)

## 2019-03-29 LAB — POCT I-STAT EG7
Acid-Base Excess: 2 mmol/L (ref 0.0–2.0)
Bicarbonate: 26.4 mmol/L (ref 20.0–28.0)
Calcium, Ion: 0.98 mmol/L — ABNORMAL LOW (ref 1.15–1.40)
HCT: 26 % — ABNORMAL LOW (ref 36.0–46.0)
Hemoglobin: 8.8 g/dL — ABNORMAL LOW (ref 12.0–15.0)
O2 Saturation: 96 %
Potassium: 4.8 mmol/L (ref 3.5–5.1)
Sodium: 122 mmol/L — ABNORMAL LOW (ref 135–145)
TCO2: 28 mmol/L (ref 22–32)
pCO2, Ven: 39 mmHg — ABNORMAL LOW (ref 44.0–60.0)
pH, Ven: 7.439 — ABNORMAL HIGH (ref 7.250–7.430)
pO2, Ven: 77 mmHg — ABNORMAL HIGH (ref 32.0–45.0)

## 2019-03-29 LAB — SODIUM: Sodium: 124 mmol/L — ABNORMAL LOW (ref 135–145)

## 2019-03-29 LAB — SARS CORONAVIRUS 2 BY RT PCR (HOSPITAL ORDER, PERFORMED IN ~~LOC~~ HOSPITAL LAB): SARS Coronavirus 2: NEGATIVE

## 2019-03-29 LAB — OSMOLALITY: Osmolality: 256 mOsm/kg — ABNORMAL LOW (ref 275–295)

## 2019-03-29 LAB — TROPONIN I: Troponin I: 0.05 ng/mL (ref <0.03)

## 2019-03-29 LAB — AMMONIA: Ammonia: 18 umol/L (ref 9–35)

## 2019-03-29 LAB — TSH: TSH: 1.521 u[IU]/mL (ref 0.350–4.500)

## 2019-03-29 LAB — VITAMIN B12: Vitamin B-12: 531 pg/mL (ref 180–914)

## 2019-03-29 MED ORDER — ONDANSETRON HCL 4 MG PO TABS: 4.0000 mg | ORAL_TABLET | Freq: Four times a day (QID) | ORAL | Status: DC | PRN

## 2019-03-29 MED ORDER — PREDNISONE 5 MG PO TABS
10.0000 mg | ORAL_TABLET | Freq: Every day | ORAL | Status: DC
  Administered 2019-03-30 – 2019-04-03 (×5): 10 mg via ORAL
  Filled 2019-03-29 (×5): qty 2

## 2019-03-29 MED ORDER — HYPROMELLOSE (GONIOSCOPIC) 2.5 % OP SOLN
1.0000 [drp] | Freq: Every day | OPHTHALMIC | Status: DC
  Filled 2019-03-29: qty 15

## 2019-03-29 MED ORDER — POLYVINYL ALCOHOL 1.4 % OP SOLN
1.0000 [drp] | Freq: Every day | OPHTHALMIC | Status: DC
  Administered 2019-03-29 – 2019-04-03 (×6): 1 [drp] via OPHTHALMIC
  Filled 2019-03-29: qty 15

## 2019-03-29 MED ORDER — SODIUM CHLORIDE 0.9% FLUSH
3.0000 mL | Freq: Two times a day (BID) | INTRAVENOUS | Status: DC
  Administered 2019-03-29 – 2019-03-31 (×5): 3 mL via INTRAVENOUS

## 2019-03-29 MED ORDER — ONDANSETRON HCL 4 MG/2ML IJ SOLN: 4.0000 mg | Freq: Four times a day (QID) | INTRAMUSCULAR | Status: DC | PRN

## 2019-03-29 MED ORDER — HYDROXYCHLOROQUINE SULFATE 200 MG PO TABS: 200.0000 mg | ORAL_TABLET | Freq: Two times a day (BID) | ORAL | Status: DC

## 2019-03-29 MED ORDER — ISOSORBIDE MONONITRATE ER 30 MG PO TB24
30.0000 mg | ORAL_TABLET | Freq: Every day | ORAL | Status: DC
  Administered 2019-03-29 – 2019-04-02 (×5): 30 mg via ORAL
  Filled 2019-03-29 (×5): qty 1

## 2019-03-29 MED ORDER — HYDRALAZINE HCL 50 MG PO TABS
100.0000 mg | ORAL_TABLET | Freq: Three times a day (TID) | ORAL | Status: DC
  Administered 2019-03-29 – 2019-04-03 (×15): 100 mg via ORAL
  Filled 2019-03-29 (×15): qty 2

## 2019-03-29 MED ORDER — SACUBITRIL-VALSARTAN 49-51 MG PO TABS
1.0000 | ORAL_TABLET | Freq: Every day | ORAL | Status: DC
  Administered 2019-03-30 – 2019-04-03 (×5): 1 via ORAL
  Filled 2019-03-29 (×7): qty 1

## 2019-03-29 MED ORDER — LEFLUNOMIDE 20 MG PO TABS
20.0000 mg | ORAL_TABLET | Freq: Every day | ORAL | Status: DC
  Administered 2019-03-30 – 2019-04-03 (×5): 20 mg via ORAL
  Filled 2019-03-29 (×5): qty 1

## 2019-03-29 MED ORDER — GABAPENTIN 100 MG PO CAPS
100.0000 mg | ORAL_CAPSULE | Freq: Three times a day (TID) | ORAL | Status: DC
  Administered 2019-03-29 – 2019-04-03 (×15): 100 mg via ORAL
  Filled 2019-03-29 (×15): qty 1

## 2019-03-29 MED ORDER — ENSURE PO LIQD: 237.0000 mL | Freq: Three times a day (TID) | ORAL | Status: DC

## 2019-03-29 MED ORDER — FERROUS SULFATE 325 (65 FE) MG PO TABS
325.0000 mg | ORAL_TABLET | ORAL | Status: DC
  Administered 2019-03-31 – 2019-04-03 (×3): 325 mg via ORAL
  Filled 2019-03-29 (×3): qty 1

## 2019-03-29 MED ORDER — ENSURE ENLIVE PO LIQD
237.0000 mL | Freq: Three times a day (TID) | ORAL | Status: DC
  Administered 2019-03-29 – 2019-04-03 (×9): 237 mL via ORAL

## 2019-03-29 MED ORDER — HYDRALAZINE HCL 100 MG PO TABS: 100.0000 mg | ORAL_TABLET | Freq: Three times a day (TID) | ORAL | Status: DC

## 2019-03-29 MED ORDER — LINAGLIPTIN 5 MG PO TABS
5.0000 mg | ORAL_TABLET | Freq: Every day | ORAL | Status: DC
  Administered 2019-03-30 – 2019-04-03 (×5): 5 mg via ORAL
  Filled 2019-03-29 (×5): qty 1

## 2019-03-29 MED ORDER — OLOPATADINE HCL 0.1 % OP SOLN
1.0000 [drp] | Freq: Two times a day (BID) | OPHTHALMIC | Status: DC
  Administered 2019-03-29 – 2019-04-03 (×10): 1 [drp] via OPHTHALMIC
  Filled 2019-03-29: qty 5

## 2019-03-29 MED ORDER — RIVAROXABAN 20 MG PO TABS
20.0000 mg | ORAL_TABLET | Freq: Every day | ORAL | Status: DC
  Administered 2019-03-30 – 2019-04-03 (×5): 20 mg via ORAL
  Filled 2019-03-29 (×6): qty 1

## 2019-03-29 MED ORDER — CARVEDILOL 12.5 MG PO TABS
25.0000 mg | ORAL_TABLET | Freq: Two times a day (BID) | ORAL | Status: DC
  Administered 2019-03-30 – 2019-04-03 (×10): 25 mg via ORAL
  Filled 2019-03-29 (×10): qty 2

## 2019-03-29 MED ORDER — ALLOPURINOL 100 MG PO TABS
100.0000 mg | ORAL_TABLET | Freq: Every day | ORAL | Status: DC
  Administered 2019-03-29 – 2019-04-02 (×5): 100 mg via ORAL
  Filled 2019-03-29 (×5): qty 1

## 2019-03-29 MED ORDER — ACETAMINOPHEN 650 MG RE SUPP: 650.0000 mg | Freq: Four times a day (QID) | RECTAL | Status: DC | PRN

## 2019-03-29 MED ORDER — DOCUSATE SODIUM 100 MG PO CAPS: 100.0000 mg | ORAL_CAPSULE | Freq: Every day | ORAL | Status: DC | PRN

## 2019-03-29 MED ORDER — ACETAMINOPHEN 325 MG PO TABS: 650.0000 mg | ORAL_TABLET | Freq: Four times a day (QID) | ORAL | Status: DC | PRN

## 2019-03-29 NOTE — ED Triage Notes (Signed)
Pt discharged from rehab stay 19 days and daughter reportedly requested she be evaluated prior to going home.  Pt states she wants to go home.  C/o only tired and weak this morning.  Alert and oriented.  No fever, pain, or GI s/sx

## 2019-03-29 NOTE — ED Notes (Signed)
ED TO INPATIENT HANDOFF REPORT  ED Nurse Name and Phone #: Britta Mccreedy RN  S Name/Age/Gender [name] [name] 85 y.o. female Room/Bed: 035C/035C  Code Status   Code Status: Full Code  Home/SNF/Other Home Patient oriented to: self, place, time and situation Is this baseline? Yes   Triage Complete: Triage complete  Chief Complaint Weakness  Triage Note Pt discharged from rehab stay 19 days and daughter reportedly requested she be evaluated prior to going home.  Pt states she wants to go home.  C/o only tired and weak this morning.  Alert and oriented.  No fever, pain, or GI s/sx  Pt also reports she will have 24/7 services at home.   Allergies Allergies  Allergen Reactions  . Clonidine Derivatives Swelling    [name]  . Fish Allergy Diarrhea, Swelling and Other (See Comments)    Turns skin "black," but can tolerate white fish Salmon- Diarrhea  . Shellfish Allergy Hives  . Doxycycline Rash  . Indomethacin Other (See Comments)    Reaction not recalled by the patient  . Lyrica [Pregabalin] Other (See Comments)    Hallucinations   . Methyldopa Other (See Comments)    Aldomet (for hypertension): Reaction not recalled by the patient  . Morphine And Related Other (See Comments)    Family reports it drops her bp that she needs iv fluids  . Orange Fruit [Citrus] Other (See Comments)    Indigestion/heartburn  . Strawberry (Diagnostic) Itching  . Cetirizine Hcl Itching and Rash  . Codeine Itching  . Levaquin [Levofloxacin In D5w] Rash  . Tomato Rash    Level of Care/Admitting Diagnosis ED Disposition    ED Disposition Condition Comment   Admit  Hospital Area: [facility] [[id number]]  Level of Care: Telemetry Medical [104]  I expect the patient will be discharged within 24 hours: No (not a candidate for 5C-Observation unit)  Covid Evaluation: Person Under Investigation (PUI)  Isolation Risk Level: Low Risk/Droplet (Less than 4L [place] supplementation)  Diagnosis: Acute encephalopathy [379432]  Admitting Physician: Rolly Salter [[id number]]  Attending Physician: Rolly Salter [[id number]]  PT Class (Do Not Modify): Observation [104]  PT Acc Code (Do Not Modify): Observation [10022]       B Medical/Surgery History Past Medical History:  Diagnosis Date  . Allergic rhinitis due to pollen 04/27/2007  . Arthritis    "in q joint" (08/07/2013)  . CHF (congestive heart failure) (HCC)   . Coronary atherosclerosis of native coronary artery   . Cough   . Edema 05/03/2013  . Exertional shortness of breath    "sometimes" (08/07/2013)  . First degree atrioventricular block   . Gout, unspecified 04/19/2013  . Hyperlipidemia 04/27/2007  . Hypertension   . Midline low back pain without sciatica 06/27/2014  . Muscle spasm   . Muscle weakness (generalized)   . Onychia and paronychia of toe   . Osteoarthrosis, unspecified whether generalized or localized, unspecified site 04/27/2007  . Other fall   . Other malaise and fatigue   . Pneumonia 05/2018  . Prepatellar bursitis   . Pulmonary embolism (HCC) 07/2018  . Rheumatic nodule 06/28/2017  . Seizures (HCC)   . Stroke (HCC) 01/17/2014  . Type II diabetes mellitus (HCC)    "fasting 90-110s" (08/07/2013)  . Unspecified vitamin D deficiency    Past Surgical History:  Procedure Laterality Date  . ABDOMINAL HYSTERECTOMY  04/1980  . APPENDECTOMY  1960  .  CARDIAC CATHETERIZATION  2003  . CATARACT EXTRACTION W/ INTRAOCULAR LENS  IMPLANT, BILATERAL Bilateral ~ 2012  . CHOLECYSTECTOMY N/A 06/26/2018   Procedure: LAPAROSCOPIC CHOLECYSTECTOMY POSSIBLE INTRAOPERATIVE CHOLANGIOGRAM;  Surgeon: Abigail MiyamotoBlackman, Douglas, MD;  Location: [facility] OR;  Service: General;  Laterality: N/A;  . JOINT REPLACEMENT    . REPLACEMENT TOTAL KNEE Right 09/2005  . RIGHT/LEFT HEART CATH AND CORONARY ANGIOGRAPHY N/A 06/01/2018   Procedure: RIGHT/LEFT HEART CATH AND  CORONARY ANGIOGRAPHY;  Surgeon: Corky CraftsVaranasi, Jayadeep S, MD;  [facility];  Service: Cardiovascular;  Laterality: N/A;  . SHOULDER OPEN ROTATOR CUFF REPAIR Right 07/1999  . TONSILLECTOMY  08/1974  . TOTAL KNEE ARTHROPLASTY Left 08/07/2013   Procedure: TOTAL KNEE ARTHROPLASTY- LEFT;  Surgeon: Dannielle HuhSteve Lucey, MD;  Location: [facility] OR;  Service: Orthopedics;  Laterality: Left;  . TRANSTHORACIC ECHOCARDIOGRAM  2003   EF 55-65%; mild concentric LVH     A IV Location/Drains/Wounds Patient Lines/Drains/Airways Status   Active Line/Drains/Airways    Name:   Placement date:   Placement time:   Site:   Days:   External Urinary Catheter   03/29/19    1720    -   less than 1   Incision (Closed) 12/14/18 Hand Right   12/14/18    1945     105   Pressure Injury 01/09/19 Deep Tissue Injury - Purple or maroon localized area of discolored intact skin or blood-filled blister due to damage of underlying soft tissue from pressure and/or shear.   01/09/19    1837     79          Intake/Output Last 24 hours No intake or output data in the 24 hours ending 03/29/19 1810  Labs/Imaging Results for orders placed or performed during the hospital encounter of 03/29/19 (from the past 48 hour(s))  CBC with Differential     Status: Abnormal   Collection Time: 03/29/19  2:00 PM  Result Value Ref Range   WBC 9.1 4.0 - 10.5 K/uL   RBC 2.76 (L) 3.87 - 5.11 MIL/uL   Hemoglobin 8.5 (L) 12.0 - 15.0 g/dL   HCT 16.125.5 (L) 09.636.0 - 04.546.0 %   MCV 92.4 80.0 - 100.0 fL   MCH 30.8 26.0 - 34.0 pg   MCHC 33.3 30.0 - 36.0 g/dL   RDW 40.917.9 (H) 81.111.5 - 91.415.5 %   Platelets 231 150 - 400 K/uL   nRBC 0.0 0.0 - 0.2 %   Neutrophils Relative % 85 %   Neutro Abs 7.8 (H) 1.7 - 7.7 K/uL   Lymphocytes Relative 9 %   Lymphs Abs 0.8 0.7 - 4.0 K/uL   Monocytes Relative 4 %   Monocytes Absolute 0.3 0.1 - 1.0 K/uL   Eosinophils Relative 1 %   Eosinophils Absolute 0.1 0.0 - 0.5 K/uL   Basophils Relative 1 %   Basophils Absolute 0.1 0.0 -  0.1 K/uL   Immature Granulocytes 0 %   Abs Immature Granulocytes 0.03 0.00 - 0.07 K/uL    Comment: Performed at [facility], [address] [place], [place] [address]  Basic metabolic panel     Status: Abnormal   Collection Time: 03/29/19  2:00 PM  Result Value Ref Range   Sodium 123 (L) 135 - 145 mmol/L   Potassium 4.9 3.5 - 5.1 mmol/L   Chloride 92 (L) 98 - 111 mmol/L   CO2 22 22 - 32 mmol/L   Glucose, Bld 122 (H) 70 - 99 mg/dL   BUN  16 8 - 23 mg/dL   Creatinine, Ser 6.81 0.44 - 1.00 mg/dL   Calcium 8.3 (L) 8.9 - 10.3 mg/dL   GFR calc non Af Amer 58 (L) >60 mL/min   GFR calc Af Amer >60 >60 mL/min   Anion gap 9 5 - 15    Comment: Performed at [facility], [address], [place], [place] [address]  Urinalysis, Routine w reflex microscopic     Status: Abnormal   Collection Time: 03/29/19  2:50 PM  Result Value Ref Range   Color, Urine STRAW (A) YELLOW   APPearance CLEAR CLEAR   Specific Gravity, Urine 1.005 1.005 - 1.030   pH 7.0 5.0 - 8.0   Glucose, UA NEGATIVE NEGATIVE mg/dL   Hgb urine dipstick NEGATIVE NEGATIVE   Bilirubin Urine NEGATIVE NEGATIVE   Ketones, ur NEGATIVE NEGATIVE mg/dL   Protein, ur NEGATIVE NEGATIVE mg/dL   Nitrite NEGATIVE NEGATIVE   Leukocytes,Ua TRACE (A) NEGATIVE   RBC / HPF 0-5 0 - 5 RBC/hpf   WBC, UA 0-5 0 - 5 WBC/hpf   Bacteria, UA NONE SEEN NONE SEEN   Squamous Epithelial / LPF 0-5 0 - 5    Comment: Performed at [facility], [address], [place], [place] [address]  POCT I-Stat EG7     Status: Abnormal   Collection Time: 03/29/19  3:37 PM  Result Value Ref Range   pH, Ven 7.439 (H) 7.250 - 7.430   pCO2, Ven 39.0 (L) 44.0 - 60.0 mmHg   pO2, Ven 77.0 (H) 32.0 - 45.0 mmHg   Bicarbonate 26.4 20.0 - 28.0 mmol/L   TCO2 28 22 - 32 mmol/L   O2 Saturation 96.0 %   Acid-Base Excess 2.0 0.0 - 2.0 mmol/L   Sodium 122 (L) 135 - 145 mmol/L   Potassium 4.8 3.5 - 5.1 mmol/L   Calcium, Ion 0.98 (L) 1.15 - 1.40 mmol/L   HCT 26.0  (L) 36.0 - 46.0 %   Hemoglobin 8.8 (L) 12.0 - 15.0 g/dL   Patient temperature HIDE    Sample type VENOUS   Troponin I - ONCE - STAT     Status: Abnormal   Collection Time: 03/29/19  3:40 PM  Result Value Ref Range   Troponin I 0.05 (HH) <0.03 ng/mL    Comment: CRITICAL RESULT CALLED TO, READ BACK BY AND VERIFIED WITH: M.Cailey Trigueros RN [id number] 03/29/2019 MCCORMICK K Performed at [facility], [address] [place], [place] [address]    *Note: Due to a large number of results and/or encounters for the requested time period, some results have not been displayed. A complete set of results can be found in Results Review.   Ct Head Wo Contrast  Result Date: 03/29/2019 CLINICAL DATA:  Weakness EXAM: CT HEAD WITHOUT CONTRAST TECHNIQUE: Contiguous axial images were obtained from the base of the skull through the vertex without intravenous contrast. COMPARISON:  Head CT 01/09/2019 FINDINGS: Brain: There is no mass, hemorrhage or extra-axial collection. The size and configuration of the ventricles and extra-axial CSF spaces are normal. There is hypoattenuation of the white matter, most commonly indicating chronic small vessel disease. Vascular: No abnormal hyperdensity of the major intracranial arteries or dural venous sinuses. No intracranial atherosclerosis. Skull: The visualized skull base, calvarium and extracranial soft tissues are normal. Sinuses/Orbits: No fluid levels or advanced mucosal thickening of the visualized paranasal sinuses. No mastoid or middle ear effusion. The orbits are normal. IMPRESSION: Chronic small vessel ischemia without acute  intracranial abnormality. Electronically Signed   By: Deatra Robinson [name]   On: 03/29/2019 15:56   Dg Chest Portable 1 View  Result Date: 03/29/2019 CLINICAL DATA:  Increased somnolence EXAM: PORTABLE CHEST 1 VIEW COMPARISON:  01/09/2019 FINDINGS: Cardiac shadow remains enlarged. Tortuous aorta with calcifications is again noted. No focal infiltrate or  sizable effusion is seen. No significant vascular congestion is noted. No bony abnormality is noted. IMPRESSION: No acute abnormality noted. Electronically Signed   By: Alcide Clever [name]   On: 03/29/2019 15:26    Pending Labs Unresulted Labs (From admission, onward)    Start     Ordered   03/30/19 0500  Basic metabolic panel  Tomorrow morning,   R     03/29/19 1717   03/30/19 0500  CBC  Tomorrow morning,   R     03/29/19 1717   03/29/19 2200  Sodium  Now then every 6 hours,   R     03/29/19 1717   03/29/19 1750  Osmolality, urine  Once,   R     03/29/19 1750   03/29/19 1717  Basic metabolic panel  Once,   R     78/93/81 1717   03/29/19 1717  Osmolality  Once,   R     03/29/19 1717   03/29/19 1717  TSH  Once,   R     03/29/19 1717   03/29/19 1717  Vitamin B12  Once,   R     03/29/19 1717   03/29/19 1717  Ammonia  Once,   R     03/29/19 1717   03/29/19 1708  SARS Coronavirus 2 (CEPHEID - Performed in [facility] hospital lab), Hosp Order  (Asymptomatic Patients Labs)  ONCE - STAT,   R    Question:  Rule Out  Answer:  Yes   03/29/19 1707   03/29/19 1314  Urine culture  ONCE - STAT,   STAT     03/29/19 1314          Vitals/Pain Today's Vitals   03/29/19 1345 03/29/19 1500 03/29/19 1515 03/29/19 1723  BP:  (!) 146/81 135/77 (!) 156/92  Pulse: 88  85 97  Resp:  (!) 21 (!) 22 20  Temp:    98.6 F (37 C)  TempSrc:    Oral  SpO2: 95%  97% 96%  Weight:      Height:      PainSc:        Isolation Precautions No active isolations  Medications Medications  allopurinol (ZYLOPRIM) tablet 100 mg (has no administration in time range)  carvedilol (COREG) tablet 25 mg (has no administration in time range)  docusate sodium (COLACE) capsule 100 mg (has no administration in time range)  Ensure liquid 237 mL (has no administration in time range)  ferrous sulfate tablet 325 mg (has no administration in time range)  gabapentin (NEURONTIN) capsule 100 mg (has no administration in time  range)  hydroxychloroquine (PLAQUENIL) tablet 200 mg (has no administration in time range)  hydroxypropyl methylcellulose / hypromellose (ISOPTO TEARS / GONIOVISC) 2.5 % ophthalmic solution 1 drop (has no administration in time range)  linagliptin (TRADJENTA) tablet 5 mg (has no administration in time range)  sacubitril-valsartan (ENTRESTO) 49-51 mg per tablet (has no administration in time range)  predniSONE (DELTASONE) tablet 10 mg (has no administration in time range)  rivaroxaban (XARELTO) tablet 20 mg (has no administration in time range)  olopatadine (PATANOL) 0.1 % ophthalmic solution 1 drop (has no administration  in time range)  leflunomide (ARAVA) tablet 20 mg (has no administration in time range)  isosorbide mononitrate (IMDUR) 24 hr tablet 30 mg (has no administration in time range)  sodium chloride flush (NS) 0.9 % injection 3 mL (has no administration in time range)  acetaminophen (TYLENOL) tablet 650 mg (has no administration in time range)    Or  acetaminophen (TYLENOL) suppository 650 mg (has no administration in time range)  ondansetron (ZOFRAN) tablet 4 mg (has no administration in time range)    Or  ondansetron (ZOFRAN) injection 4 mg (has no administration in time range)  hydrALAZINE (APRESOLINE) tablet 100 mg (has no administration in time range)    Mobility non-ambulatory High fall risk   Focused Assessments Cardiac Assessment Handoff:    Lab Results  Component Value Date   TROPONINI 0.05 (HH) 03/29/2019   No results found for: DDIMER Does the Patient currently have chest pain? No     R Recommendations: See Admitting Provider Note  Report given to:   Additional Notes:

## 2019-03-29 NOTE — H&P (Signed)
[facility] History and Physical   Patient: [name] [name] ZDG:[id number]   PCP: Helane RimaWallace, Erica, DO DOB: 04/13/1930   DOA: 03/29/2019   DOS: 03/29/2019   DOS: the patient was seen and examined on 03/29/2019  Patient coming from: The patient is coming from home.  Chief Complaint: acute confusion status   HPI: [name] CluckHazel C Dibari is a 85 y.o. female with Past medical history of HTN; DM; CVA; seizures; CAD; and CHF . The patient is presenting with generalized weakness. Patient was recently hospitalized for confusion from polypharmacy.  Discharge to SNF.  During the SNF stay the patient and her daughter did not receive adequate physical therapy.  Patient was either in too much pain or sleepy because of pain medication.  She felt that the patient was being neglected there.  At the time of the discharge from the SNF daughter felt that the patient be willing to the hospital and therefore she was brought to the hospital. Patient tells me that she woke up not feeling good and therefore she is in the hospital. On further questioning she denies any complaints of chest pain, abdominal pain, nausea, vomiting, diarrhea, fever, headache, burning urination.  Also denies any focal deficit.  No fall no trauma no injury reported recently.  No recent change in medications reported as well.  ED Course: Patient was brought in with generalized fatigue.  During my evaluation patient will go to sleep mid conversation.  Orientation questions were appropriate and patient was wobbled to follow commands with some slow response time.  Her sodium was 123 and with her increased somnolence patient was admitted to the hospital for observation for acute encephalopathy.  At her baseline ambulates with support And is dependent for most of her ADL; does not manages her medication on her own.  Review of Systems: as mentioned in the history of present illness.  All other systems reviewed and are negative.  Past Medical History:   Diagnosis Date  . Allergic rhinitis due to pollen 04/27/2007  . Arthritis    "in q joint" (08/07/2013)  . CHF (congestive heart failure) (HCC)   . Coronary atherosclerosis of native coronary artery   . Cough   . Edema 05/03/2013  . Exertional shortness of breath    "sometimes" (08/07/2013)  . First degree atrioventricular block   . Gout, unspecified 04/19/2013  . Hyperlipidemia 04/27/2007  . Hypertension   . Midline low back pain without sciatica 06/27/2014  . Muscle spasm   . Muscle weakness (generalized)   . Onychia and paronychia of toe   . Osteoarthrosis, unspecified whether generalized or localized, unspecified site 04/27/2007  . Other fall   . Other malaise and fatigue   . Pneumonia 05/2018  . Prepatellar bursitis   . Pulmonary embolism (HCC) 07/2018  . Rheumatic nodule 06/28/2017  . Seizures (HCC)   . Stroke (HCC) 01/17/2014  . Type II diabetes mellitus (HCC)    "fasting 90-110s" (08/07/2013)  . Unspecified vitamin D deficiency    Past Surgical History:  Procedure Laterality Date  . ABDOMINAL HYSTERECTOMY  04/1980  . APPENDECTOMY  1960  . CARDIAC CATHETERIZATION  2003  . CATARACT EXTRACTION W/ INTRAOCULAR LENS  IMPLANT, BILATERAL Bilateral ~ 2012  . CHOLECYSTECTOMY N/A 06/26/2018   Procedure: LAPAROSCOPIC CHOLECYSTECTOMY POSSIBLE INTRAOPERATIVE CHOLANGIOGRAM;  Surgeon: Abigail MiyamotoBlackman, Douglas, MD;  Location: [facility] OR;  Service: General;  Laterality: N/A;  . JOINT REPLACEMENT    . REPLACEMENT TOTAL KNEE Right 09/2005  . RIGHT/LEFT HEART CATH AND CORONARY ANGIOGRAPHY N/A  06/01/2018   Procedure: RIGHT/LEFT HEART CATH AND CORONARY ANGIOGRAPHY;  Surgeon: Corky CraftsVaranasi, Jayadeep S, MD;  [facility];  Service: Cardiovascular;  Laterality: N/A;  . SHOULDER OPEN ROTATOR CUFF REPAIR Right 07/1999  . TONSILLECTOMY  08/1974  . TOTAL KNEE ARTHROPLASTY Left 08/07/2013   Procedure: TOTAL KNEE ARTHROPLASTY- LEFT;  Surgeon: Dannielle HuhSteve Lucey, MD;  Location: [facility] OR;  Service: Orthopedics;   Laterality: Left;  . TRANSTHORACIC ECHOCARDIOGRAM  2003   EF 55-65%; mild concentric LVH   Social History:  reports that she has never smoked. She has never used smokeless tobacco. She reports that she does not drink alcohol or use drugs.  Allergies  Allergen Reactions  . Clonidine Derivatives Swelling    [name]  . Fish Allergy Diarrhea, Swelling and Other (See Comments)    Turns skin "black," but can tolerate white fish Salmon- Diarrhea  . Shellfish Allergy Hives  . Doxycycline Rash  . Indomethacin Other (See Comments)    Reaction not recalled by the patient  . Lyrica [Pregabalin] Other (See Comments)    Hallucinations   . Methyldopa Other (See Comments)    Aldomet (for hypertension): Reaction not recalled by the patient  . Morphine And Related Other (See Comments)    Family reports it drops her bp that she needs iv fluids  . Orange Fruit [Citrus] Other (See Comments)    Indigestion/heartburn  . Strawberry (Diagnostic) Itching  . Cetirizine Hcl Itching and Rash  . Codeine Itching  . Levaquin [Levofloxacin In D5w] Rash  . Tomato Rash   Family History  Problem Relation Age of Onset  . Heart attack Father      Prior to Admission medications   Medication Sig Start Date End Date Taking? Authorizing Provider  acetaminophen (TYLENOL) 500 MG tablet Take 1,000 mg by mouth every 12 (twelve) hours.    Yes [provider]  albuterol (PROVENTIL HFA;VENTOLIN HFA) 108 (90 Base) MCG/ACT inhaler Inhale 1-2 puffs into the lungs every 6 (six) hours as needed for wheezing or shortness of breath. 12/20/18  Yes Roberto ScalesNettey, Shayla D, MD  allopurinol (ZYLOPRIM) 100 MG tablet Take 1 tablet (100 mg total) by mouth at bedtime. 12/20/18  Yes Roberto ScalesNettey, Shayla D, MD  carvedilol (COREG) 25 MG tablet Take 1 tablet (25 mg total) by mouth 2 (two) times daily with a meal. 12/20/18  Yes Roberto ScalesNettey, Shayla D, MD  D3-1000 25 MCG (1000 UT)  capsule Take 1 capsule (1,000 Units total) by mouth daily. Patient taking differently: Take 1,000 Units by mouth at bedtime.  12/20/18  Yes Roberto ScalesNettey, Shayla D, MD  docusate sodium (COLACE) 100 MG capsule Take 1 capsule (100 mg total) by mouth daily as needed for mild constipation. 12/20/18  Yes Laverna PeaceNettey, Shayla D, MD  ENSURE (ENSURE) Take 237 mLs by mouth every evening. 12/20/18  Yes Roberto ScalesNettey, Shayla D, MD  ferrous sulfate 325 (65 FE) MG tablet Take 325 mg by mouth every other day.   Yes [provider]  furosemide (LASIX) 20 MG tablet Take 1 tablet (20 mg total) by mouth daily. 12/20/18  Yes Roberto ScalesNettey, Shayla D, MD  gabapentin (NEURONTIN) 400 MG capsule Take 400-800 mg by mouth See admin instructions. Take 400 mg at 8 am and 1400 and 800 mg in the evening   Yes [provider]  hydrALAZINE (APRESOLINE) 100 MG tablet Take 1 tablet (100 mg total) by mouth 3 (three) times daily. 12/20/18  Yes Laverna PeaceNettey, Shayla D, MD  HYDROcodone-acetaminophen (NORCO/VICODIN) 5-325 MG tablet Take 0.5 tablets by mouth daily.    Yes [provider]  hydroxychloroquine (PLAQUENIL) 200 MG tablet Take 1 tablet (200 mg total) by mouth 2 (two) times daily. 12/20/18  Yes Roberto Scales D, MD  hydroxypropyl methylcellulose / hypromellose (ISOPTO TEARS / GONIOVISC) 2.5 % ophthalmic solution Place 1 drop into both eyes daily. 12/20/18  Yes Roberto Scales D, MD  isosorbide mononitrate (IMDUR) 30 MG 24 hr tablet Take 1 tablet (30 mg total) by mouth at bedtime. 12/20/18  Yes Roberto Scales D, MD  leflunomide (ARAVA) 20 MG tablet Take 1 tablet (20 mg total) by mouth daily. 12/20/18  Yes Roberto Scales D, MD  Lidocaine (ASPERCREME LIDOCAINE) 4 % PTCH Apply 1 Package topically daily.    Yes [provider]  LORazepam (ATIVAN) 0.5 MG tablet Take 0.25 mg by mouth as needed for anxiety.    Yes [provider]  Melatonin 3 MG TABS Take 6 mg by mouth at bedtime.    Yes [provider]  Olopatadine HCl 0.2 %  SOLN Place 1 drop into both eyes daily. 12/20/18  Yes Roberto Scales D, MD  potassium chloride SA (K-DUR) 20 MEQ tablet Take 20 mEq by mouth See admin instructions. Monday and Thursday   Yes [provider]  predniSONE (DELTASONE) 10 MG tablet Take 10 mg by mouth daily with breakfast.   Yes [provider]  rivaroxaban (XARELTO) 20 MG TABS tablet Take 1 tablet (20 mg total) by mouth daily with supper. 12/20/18  Yes Laverna Peace, MD  saccharomyces boulardii (FLORASTOR) 250 MG capsule Take 250 mg by mouth every other day.   Yes [provider]  sacubitril-valsartan (ENTRESTO) 49-51 MG Take 1 tablet by mouth daily at 12 noon. 12/20/18  Yes Roberto Scales D, MD  sitaGLIPtin (JANUVIA) 25 MG tablet Take 12.5 mg by mouth daily.    Yes [provider]    Physical Exam: Vitals:   03/29/19 1730 03/29/19 1745 03/29/19 1800 03/29/19 1815  BP: (!) 149/82 (!) 151/82 (!) 153/82 (!) 149/85  Pulse: 96 95 94 94  Resp: (!) 24 (!) 25 (!) 21 16  Temp:      TempSrc:      SpO2: 97% 98% 98% 97%  Weight:      Height:        General: drowsy and lethargic and Oriented to Time, Place and Person. Appear in mild distress, affect flat Eyes: PERRL, Conjunctiva normal ENT: Oral Mucosa clear dry. Neck: difficult to assess  JVD, no Abnormal Mass Or lumps Cardiovascular: S1 and S2 Present, no Murmur, Peripheral Pulses Present Respiratory: normal respiratory effort, Bilateral Air entry equal and Decreased, no use of accessory muscle, Clear to Auscultation, no Crackles, no wheezes Abdomen: Bowel Sound present, Soft and no tenderness, no hernia Skin: no redness, no Rash, no induration Extremities: no Pedal edema, no calf tenderness Neurologic: Grossly no focal neuro deficit. Bilaterally Equal motor strength  Labs on Admission:  CBC: Recent Labs  Lab 03/29/19 1400 03/29/19 1537  WBC 9.1  --   NEUTROABS 7.8*  --   HGB 8.5* 8.8*  HCT 25.5* 26.0*  MCV 92.4  --   PLT 231  --     Basic Metabolic Panel: Recent Labs  Lab 03/29/19 1400 03/29/19 1537  NA 123* 122*  K 4.9 4.8  CL 92*  --   CO2 22  --   GLUCOSE 122*  --   BUN 16  --  CREATININE 0.90  --   CALCIUM 8.3*  --    GFR: Estimated Creatinine Clearance: 46.4 mL/min (by C-G formula based on SCr of 0.9 mg/dL). Liver Function Tests: No results for input(s): AST, ALT, ALKPHOS, BILITOT, PROT, ALBUMIN in the last 168 hours. No results for input(s): LIPASE, AMYLASE in the last 168 hours. No results for input(s): AMMONIA in the last 168 hours. Coagulation Profile: No results for input(s): INR, PROTIME in the last 168 hours. Cardiac Enzymes: Recent Labs  Lab 03/29/19 1540  TROPONINI 0.05*   BNP (last 3 results) No results for input(s): PROBNP in the last 8760 hours. HbA1C: No results for input(s): HGBA1C in the last 72 hours. CBG: No results for input(s): GLUCAP in the last 168 hours. Lipid Profile: No results for input(s): CHOL, HDL, LDLCALC, TRIG, CHOLHDL, LDLDIRECT in the last 72 hours. Thyroid Function Tests: No results for input(s): TSH, T4TOTAL, FREET4, T3FREE, THYROIDAB in the last 72 hours. Anemia Panel: No results for input(s): VITAMINB12, FOLATE, FERRITIN, TIBC, IRON, RETICCTPCT in the last 72 hours. Urine analysis:    Component Value Date/Time   COLORURINE STRAW (A) 03/29/2019 1450   APPEARANCEUR CLEAR 03/29/2019 1450   LABSPEC 1.005 03/29/2019 1450   PHURINE 7.0 03/29/2019 1450   GLUCOSEU NEGATIVE 03/29/2019 1450   HGBUR NEGATIVE 03/29/2019 1450   BILIRUBINUR NEGATIVE 03/29/2019 1450   BILIRUBINUR Negative 01/13/2017 1234   KETONESUR NEGATIVE 03/29/2019 1450   PROTEINUR NEGATIVE 03/29/2019 1450   UROBILINOGEN 0.2 01/13/2017 1234   UROBILINOGEN 1.0 09/11/2015 0135   NITRITE NEGATIVE 03/29/2019 1450   LEUKOCYTESUR TRACE (A) 03/29/2019 1450    Radiological Exams on Admission: Ct Head Wo Contrast  Result Date: 03/29/2019 CLINICAL DATA:  Weakness EXAM: CT HEAD WITHOUT  CONTRAST TECHNIQUE: Contiguous axial images were obtained from the base of the skull through the vertex without intravenous contrast. COMPARISON:  Head CT 01/09/2019 FINDINGS: Brain: There is no mass, hemorrhage or extra-axial collection. The size and configuration of the ventricles and extra-axial CSF spaces are normal. There is hypoattenuation of the white matter, most commonly indicating chronic small vessel disease. Vascular: No abnormal hyperdensity of the major intracranial arteries or dural venous sinuses. No intracranial atherosclerosis. Skull: The visualized skull base, calvarium and extracranial soft tissues are normal. Sinuses/Orbits: No fluid levels or advanced mucosal thickening of the visualized paranasal sinuses. No mastoid or middle ear effusion. The orbits are normal. IMPRESSION: Chronic small vessel ischemia without acute intracranial abnormality. Electronically Signed   By: Deatra Robinson [name]   On: 03/29/2019 15:56   Dg Chest Portable 1 View  Result Date: 03/29/2019 CLINICAL DATA:  Increased somnolence EXAM: PORTABLE CHEST 1 VIEW COMPARISON:  01/09/2019 FINDINGS: Cardiac shadow remains enlarged. Tortuous aorta with calcifications is again noted. No focal infiltrate or sizable effusion is seen. No significant vascular congestion is noted. No bony abnormality is noted. IMPRESSION: No acute abnormality noted. Electronically Signed   By: Alcide Clever [name]   On: 03/29/2019 15:26   EKG: Independently reviewed. normal sinus rhythm, nonspecific ST and T waves changes, prolonged QT interval.  Assessment/Plan 1. Acute encephalopathy Toxic in nature from polypharmacy. Patient is on multiple psychotropic medication along with pain medication. Currently will reduce the dose of gabapentin, hold her Ativan and also hold pain medication. Monitor on telemetry. CT head unremarkable. UA unremarkable. No focal deficit on my examination. Check further metabolic work-up to rule out any metabolic  etiology of encephalopathy.  2.  Physical deconditioning. Chronic back pain. Spinal stenosis. Patient is following up  with orthopedic and is scheduled to have surgery outpatient. Her deconditioning is progressively worsening secondary to pain medications as well as her pain. PT OT consulted.  3.  Hyponatremia. Etiology not clear. Patient does have history of CHF as well as does not appear to be dehydrated. We will check serum osmolarity urine osmolarity urine sodium for further work-up. Currently maintained a free water restriction. Sodium every 6 hours. Hold off diuretics per  4. History of DVT. Continue Xarelto.  5.  Chronic combined CHF. Continue Coreg home dose, hold Lasix, continue hydralazine, Imdur, Entresto.  6.  Type 2 diabetes mellitus, uncontrolled with hyperglycemia as well as renal complication Continue liraglutide, placing her on sliding scale insulin.  7.  Chronic rheumatoid arthritis primarily involving small joints as well as knee. Prolonged QTC. Holding on Plaquenil. Continue Arava. Continue prednisone.  8.  History of gout. Continue allopurinol.  Nutrition: regular diet, fluid restricted DVT Prophylaxis: on therapeutic anticoagulation.  Advance goals of care discussion: full code   Consults: none  Family Communication: no family was present at bedside, at the time of interview.  Disposition: Admitted as observation telemetry unit. Likely to be discharged home, in 1-2 days.  Author: Lynden OxfordPranav Malana Eberwein, MD [facility] 03/29/2019  To reach On-call, see care teams to locate the attending and reach out to them via [email or web address]. If 7PM-7AM, please contact night-coverage If you still have difficulty reaching the attending provider, please page the [facility] (Director on Call) for [facility] on amion for assistance.

## 2019-03-29 NOTE — Telephone Encounter (Signed)
See if it is possible for me to visit her at the center, or speak with the provider taking care of her, or do a video visit with her.

## 2019-03-29 NOTE — ED Provider Notes (Signed)
[facility] EMERGENCY DEPARTMENT Provider Note   CSN: [id number] Arrival date & time: 03/29/19  1233    History   Chief Complaint No chief complaint on file.   HPI [name] [name] is a 85 y.o. female with h/o elevated troponin, rheumatoid arthritis, severe lumbar spinal stenosis on chronic opioids, HTN, combined systolic/diastolic HF, DM, CKD, anemia, chronic right heel wound, CVA brought to ED for evaluation of weakness. She was discharged from [facility] today and was planned to be discharged back home.  I called facility and spoke to RN who had her yesterday and today states patient has been in rehab since March  After [facility] discharge. Per RN, physical therapy at facility has been unable to work with patient consistently due to either severe back pain and/or being too somnolent during PT.  RN stated daughter wanted patient to be checked out in the ED before returning home.  Pt is oriented to self, place, time and event. States she is here because she woke up this morning and felt weak all over feeling like she can't walk.  Reports chronic low back pain, unchanged.  Denies headache, vision changes, CP, SOB, cough, dysuria, diarrhea, vomiting, abdominal pain.  Will call daughter for further history.   1520: I spoke to [name] who expresses frustration about PT at rehab.  States since March patient has not been gaining any strength.  She still cannot roll over in bed by herself or stand up.  She is concerned that patient is sometimes in too much pain or too somnolent from taking too much pain medicines.  Overall patient's strength is declining despite being in rehab.  Daughter still unsure when or if orthopedics plans on doing lumbar spine surgery, fears recent worsening weakness may be preventing surgery.  Daughter called PCP who advised patient be evaluated in the ED.    HPI  Past Medical History:  Diagnosis Date  . Allergic rhinitis due to pollen  04/27/2007  . Arthritis    "in q joint" (08/07/2013)  . CHF (congestive heart failure) (HCC)   . Coronary atherosclerosis of native coronary artery   . Cough   . Edema 05/03/2013  . Exertional shortness of breath    "sometimes" (08/07/2013)  . First degree atrioventricular block   . Gout, unspecified 04/19/2013  . Hyperlipidemia 04/27/2007  . Hypertension   . Midline low back pain without sciatica 06/27/2014  . Muscle spasm   . Muscle weakness (generalized)   . Onychia and paronychia of toe   . Osteoarthrosis, unspecified whether generalized or localized, unspecified site 04/27/2007  . Other fall   . Other malaise and fatigue   . Pneumonia 05/2018  . Prepatellar bursitis   . Pulmonary embolism (HCC) 07/2018  . Rheumatic nodule 06/28/2017  . Seizures (HCC)   . Stroke (HCC) 01/17/2014  . Type II diabetes mellitus (HCC)    "fasting 90-110s" (08/07/2013)  . Unspecified vitamin D deficiency     Patient Active Problem List   Diagnosis Date Noted  . Pressure injury of skin 01/12/2019  . AMS (altered mental status) 01/09/2019  . Rheumatoid arthritis flare (HCC) 12/23/2018  . Weakness of right lower extremity 12/23/2018  . Abscess of right upper extremity 12/14/2018  . Fever 12/13/2018  . Hand swelling 12/02/2018  . Pain, wrist 12/02/2018  . Hand pain 12/02/2018  . Polyarthritis 12/02/2018  . Spinal stenosis, lumbar 11/19/2018  . Acute encephalopathy 11/19/2018  . History of CVA (cerebrovascular accident) 11/19/2018  .  Acute gout 11/15/2018  . Low back pain 11/14/2018  . Chronic back pain 10/27/2018  . Arthralgia of both knees 10/27/2018  . Rheumatoid arthritis involving both knees (HCC) 10/27/2018  . Acute pain 10/21/2018  . Physical deconditioning 09/06/2018  . Hyperlipidemia associated with type 2 diabetes mellitus (HCC), refuses statin 09/06/2018  . Refusal of statin medication by patient 09/04/2018  . Deep vein thrombosis (DVT) of distal vein of right lower extremity  (HCC) 07/30/2018  . Hypertensive heart disease with heart failure (HCC) 07/30/2018  . Pulmonary embolism (HCC), 07/2018, on Xarelto with goal to DC after 6 months if more active 07/10/2018  . DCM (dilated cardiomyopathy) (HCC)   . Benign hypertensive heart and kidney disease with CHF and stage 3 chronic kidney disease (HCC) 05/28/2018  . Osteoarthritis 05/06/2018  . Hypokalemia 02/02/2018  . Adhesive capsulitis of right shoulder 10/12/2017  . Idiopathic chronic gout of multiple sites with tophus 08/25/2017  . History of total knee arthroplasty, bilateral 02/18/2017  . History of rotator cuff surgery 02/18/2017  . High risk medications, long-term use 09/21/2016  . Chronic combined systolic and diastolic CHF (congestive heart failure) (HCC) 09/12/2015  . Bilateral edema of lower extremity 06/05/2014  . CKD stage 3 due to type 2 diabetes mellitus (HCC) 06/05/2014  . DM (diabetes mellitus), type 2 with renal complications (HCC) 06/05/2014    Class: Chronic  . Anemia of chronic disease 05/01/2014  . CAD (coronary artery disease) 10/18/2013  . Hypertension associated with diabetes (HCC) 08/08/2013    Class: Chronic  . Fibromyalgia 05/10/2013  . Type 2 diabetes mellitus with hyperlipidemia (HCC), patient declines statin 04/27/2007  . Allergic rhinitis 04/27/2007  . Diverticulosis 04/27/2007    Past Surgical History:  Procedure Laterality Date  . ABDOMINAL HYSTERECTOMY  04/1980  . APPENDECTOMY  1960  . CARDIAC CATHETERIZATION  2003  . CATARACT EXTRACTION W/ INTRAOCULAR LENS  IMPLANT, BILATERAL Bilateral ~ 2012  . CHOLECYSTECTOMY N/A 06/26/2018   Procedure: LAPAROSCOPIC CHOLECYSTECTOMY POSSIBLE INTRAOPERATIVE CHOLANGIOGRAM;  Surgeon: Abigail MiyamotoBlackman, Douglas, MD;  Location: [facility] OR;  Service: General;  Laterality: N/A;  . JOINT REPLACEMENT    . REPLACEMENT TOTAL KNEE Right 09/2005  . RIGHT/LEFT HEART CATH AND CORONARY ANGIOGRAPHY N/A 06/01/2018   Procedure: RIGHT/LEFT HEART CATH AND CORONARY  ANGIOGRAPHY;  Surgeon: Corky CraftsVaranasi, Jayadeep S, MD;  [facility];  Service: Cardiovascular;  Laterality: N/A;  . SHOULDER OPEN ROTATOR CUFF REPAIR Right 07/1999  . TONSILLECTOMY  08/1974  . TOTAL KNEE ARTHROPLASTY Left 08/07/2013   Procedure: TOTAL KNEE ARTHROPLASTY- LEFT;  Surgeon: Dannielle HuhSteve Lucey, MD;  Location: [facility] OR;  Service: Orthopedics;  Laterality: Left;  . TRANSTHORACIC ECHOCARDIOGRAM  2003   EF 55-65%; mild concentric LVH     OB History   No obstetric history on file.      Home Medications    Prior to Admission medications   Medication Sig Start Date End Date Taking? Authorizing Provider  acetaminophen (TYLENOL) 500 MG tablet Take 1,000 mg by mouth every 12 (twelve) hours.    Yes [provider]  albuterol (PROVENTIL HFA;VENTOLIN HFA) 108 (90 Base) MCG/ACT inhaler Inhale 1-2 puffs into the lungs every 6 (six) hours as needed for wheezing or shortness of breath. 12/20/18  Yes Roberto ScalesNettey, Shayla D, MD  allopurinol (ZYLOPRIM) 100 MG tablet Take 1 tablet (100 mg total) by mouth at bedtime. 12/20/18  Yes Roberto ScalesNettey, Shayla D, MD  carvedilol (COREG) 25 MG tablet Take 1 tablet (25 mg total) by mouth 2 (two) times daily  with a meal. 12/20/18  Yes Roberto Scales D, MD  D3-1000 25 MCG (1000 UT) capsule Take 1 capsule (1,000 Units total) by mouth daily. Patient taking differently: Take 1,000 Units by mouth at bedtime.  12/20/18  Yes Roberto Scales D, MD  docusate sodium (COLACE) 100 MG capsule Take 1 capsule (100 mg total) by mouth daily as needed for mild constipation. 12/20/18  Yes Laverna Peace, MD  ENSURE (ENSURE) Take 237 mLs by mouth every evening. 12/20/18  Yes Roberto Scales D, MD  ferrous sulfate 325 (65 FE) MG tablet Take 325 mg by mouth every other day.   Yes [provider]  furosemide (LASIX) 20 MG tablet Take 1 tablet (20 mg total) by mouth daily. 12/20/18  Yes Roberto Scales D, MD  gabapentin (NEURONTIN) 400 MG capsule Take 400-800 mg by mouth See admin  instructions. Take 400 mg at 8 am and 1400 and 800 mg in the evening   Yes [provider]  hydrALAZINE (APRESOLINE) 100 MG tablet Take 1 tablet (100 mg total) by mouth 3 (three) times daily. 12/20/18  Yes Roberto Scales D, MD  HYDROcodone-acetaminophen (NORCO/VICODIN) 5-325 MG tablet Take 0.5 tablets by mouth daily.    Yes [provider]  hydroxychloroquine (PLAQUENIL) 200 MG tablet Take 1 tablet (200 mg total) by mouth 2 (two) times daily. 12/20/18  Yes Roberto Scales D, MD  hydroxypropyl methylcellulose / hypromellose (ISOPTO TEARS / GONIOVISC) 2.5 % ophthalmic solution Place 1 drop into both eyes daily. 12/20/18  Yes Roberto Scales D, MD  isosorbide mononitrate (IMDUR) 30 MG 24 hr tablet Take 1 tablet (30 mg total) by mouth at bedtime. 12/20/18  Yes Roberto Scales D, MD  leflunomide (ARAVA) 20 MG tablet Take 1 tablet (20 mg total) by mouth daily. 12/20/18  Yes Roberto Scales D, MD  Lidocaine (ASPERCREME LIDOCAINE) 4 % PTCH Apply 1 Package topically daily.    Yes [provider]  LORazepam (ATIVAN) 0.5 MG tablet Take 0.25 mg by mouth as needed for anxiety.    Yes [provider]  Melatonin 3 MG TABS Take 6 mg by mouth at bedtime.    Yes [provider]  Olopatadine HCl 0.2 % SOLN Place 1 drop into both eyes daily. 12/20/18  Yes Roberto Scales D, MD  potassium chloride SA (K-DUR) 20 MEQ tablet Take 20 mEq by mouth See admin instructions. Monday and Thursday   Yes [provider]  predniSONE (DELTASONE) 10 MG tablet Take 10 mg by mouth daily with breakfast.   Yes [provider]  rivaroxaban (XARELTO) 20 MG TABS tablet Take 1 tablet (20 mg total) by mouth daily with supper. 12/20/18  Yes Laverna Peace, MD  saccharomyces boulardii (FLORASTOR) 250 MG capsule Take 250 mg by mouth every other day.   Yes [provider]  sacubitril-valsartan (ENTRESTO) 49-51 MG Take 1 tablet by mouth daily at 12 noon. 12/20/18  Yes Roberto Scales D, MD   sitaGLIPtin (JANUVIA) 25 MG tablet Take 12.5 mg by mouth daily.    Yes [provider]    Family History Family History  Problem Relation Age of Onset  . Heart attack Father     Social History Social History   Tobacco Use  . Smoking status: Never Smoker  . Smokeless tobacco: Never Used  Substance Use Topics  . Alcohol use: No    Alcohol/week: 0.0 standard drinks  . Drug use: No     Allergies   Clonidine derivatives; Fish allergy; Shellfish allergy;  Doxycycline; Indomethacin; Lyrica [pregabalin]; Methyldopa; Morphine and related; Orange fruit [citrus]; Strawberry (diagnostic); Cetirizine hcl; Codeine; Levaquin [levofloxacin in d5w]; and Tomato   Review of Systems Review of Systems  Neurological: Positive for weakness.  All other systems reviewed and are negative.    Physical Exam Updated Vital Signs BP (!) 154/82 (BP Location: Left Arm)   Pulse 97   Temp 99.1 F (37.3 C) (Oral)   Resp 18   Ht 5\' 4"  (1.626 m)   Wt 81.2 kg   SpO2 97%   BMI 30.73 kg/m   Physical Exam Vitals signs and nursing note reviewed.  Constitutional:      Appearance: She is well-developed.     Comments: Snoring, arousable to voice. Non toxic.   HENT:     Head: Normocephalic and atraumatic.     Comments: No facial, nasal, scalp bone tenderness.    Nose: Nose normal.     Mouth/Throat:     Comments: Dry lips MMM Eyes:     Conjunctiva/sclera: Conjunctivae normal.  Neck:     Musculoskeletal: Normal range of motion.     Comments: c-spine: no midline or paraspinal muscle tenderness Cardiovascular:     Rate and Rhythm: Normal rate and regular rhythm.     Comments: 1+ radial and DP pulses bilaterally. No LE edema. No calf tenderness. Pulmonary:     Effort: Pulmonary effort is normal.     Breath sounds: Normal breath sounds.  Abdominal:     General: Bowel sounds are normal.     Palpations: Abdomen is soft.     Tenderness: There is no abdominal tenderness.     Comments: No  G/R/R. No suprapubic or CVA tenderness.  Musculoskeletal: Normal range of motion.  Skin:    General: Skin is warm and dry.     Capillary Refill: Capillary refill takes less than 2 seconds.     Comments: No sacral wounds. Chronic RIGHT heel wound without surrounding erythema, warmth, discharge, tenderness.   Neurological:     Mental Status: She is alert.     Comments:  Somnolent, falls asleep during conversation but can be arousable to voice. Alert and oriented to self, place, time and event.  Speech without dysarthria or dysphasia.  3/5 strength but symmetric strength in all upper/lower extremities. Can lift and hold arms 5+ seconds, cannot lift legs off bed but can heel drag bilaterally with equal strength. Difficult exam given somnolence  Sensation to light touch intact in face, hands and feet. Patient cannot sit up on her own No pronator drift. Cannot assess leg drop, cannot lift legs off bed.  Finger to nose intact bilaterally  CN I and II not tested CN III, IV, VI PEERL and EOMs intact bilaterally CN V light touch intact in all 3 divisions of trigeminal nerve CN VII facial movements symmetric CN VIII not tested CN IX, X no uvula deviation, symmetric rise of soft palate  CN XI 5/5 SCM and trapezius strength bilaterally  CN XII Midline tongue protrusion, symmetric L/R movements  Psychiatric:        Behavior: Behavior normal.      ED Treatments / Results  Labs (all labs ordered are listed, but only abnormal results are displayed) Labs Reviewed  CBC WITH DIFFERENTIAL/PLATELET - Abnormal; Notable for the following components:      Result Value   RBC 2.76 (*)    Hemoglobin 8.5 (*)    HCT 25.5 (*)    RDW 17.9 (*)    Neutro Abs  7.8 (*)    All other components within normal limits  BASIC METABOLIC PANEL - Abnormal; Notable for the following components:   Sodium 123 (*)    Chloride 92 (*)    Glucose, Bld 122 (*)    Calcium 8.3 (*)    GFR calc non Af Amer 58 (*)    All  other components within normal limits  URINALYSIS, ROUTINE W REFLEX MICROSCOPIC - Abnormal; Notable for the following components:   Color, Urine STRAW (*)    Leukocytes,Ua TRACE (*)    All other components within normal limits  TROPONIN I - Abnormal; Notable for the following components:   Troponin I 0.05 (*)    All other components within normal limits  BASIC METABOLIC PANEL - Abnormal; Notable for the following components:   Sodium 123 (*)    Chloride 90 (*)    Glucose, Bld 111 (*)    Calcium 8.6 (*)    All other components within normal limits  OSMOLALITY - Abnormal; Notable for the following components:   Osmolality 256 (*)    All other components within normal limits  POCT I-STAT EG7 - Abnormal; Notable for the following components:   pH, Ven 7.439 (*)    pCO2, Ven 39.0 (*)    pO2, Ven 77.0 (*)    Sodium 122 (*)    Calcium, Ion 0.98 (*)    HCT 26.0 (*)    Hemoglobin 8.8 (*)    All other components within normal limits  SARS CORONAVIRUS 2 (HOSPITAL ORDER, PERFORMED IN [facility])  URINE CULTURE  TSH  VITAMIN B12  AMMONIA  BASIC METABOLIC PANEL  CBC  SODIUM  OSMOLALITY, URINE  I-STAT VENOUS BLOOD GAS, ED    EKG EKG Interpretation  Date/Time:  Wednesday Mar 29 2019 13:45:37 EDT Ventricular Rate:  89 PR Interval:    QRS Duration: 99 QT Interval:  444 QTC Calculation: 541 R Axis:   -21 Text Interpretation:  Sinus rhythm LVH with secondary repolarization abnormality Prolonged QT interval compared with prior 3/20 axis change - ?prior was incorrect Confirmed by Meridee ScoreButler, Michael [phone]) on 03/29/2019 1:55:12 PM   Radiology Ct Head Wo Contrast  Result Date: 03/29/2019 CLINICAL DATA:  Weakness EXAM: CT HEAD WITHOUT CONTRAST TECHNIQUE: Contiguous axial images were obtained from the base of the skull through the vertex without intravenous contrast. COMPARISON:  Head CT 01/09/2019 FINDINGS: Brain: There is no mass, hemorrhage or extra-axial collection. The  size and configuration of the ventricles and extra-axial CSF spaces are normal. There is hypoattenuation of the white matter, most commonly indicating chronic small vessel disease. Vascular: No abnormal hyperdensity of the major intracranial arteries or dural venous sinuses. No intracranial atherosclerosis. Skull: The visualized skull base, calvarium and extracranial soft tissues are normal. Sinuses/Orbits: No fluid levels or advanced mucosal thickening of the visualized paranasal sinuses. No mastoid or middle ear effusion. The orbits are normal. IMPRESSION: Chronic small vessel ischemia without acute intracranial abnormality. Electronically Signed   By: Deatra RobinsonKevin  Herman [name]   On: 03/29/2019 15:56   Dg Chest Portable 1 View  Result Date: 03/29/2019 CLINICAL DATA:  Increased somnolence EXAM: PORTABLE CHEST 1 VIEW COMPARISON:  01/09/2019 FINDINGS: Cardiac shadow remains enlarged. Tortuous aorta with calcifications is again noted. No focal infiltrate or sizable effusion is seen. No significant vascular congestion is noted. No bony abnormality is noted. IMPRESSION: No acute abnormality noted. Electronically Signed   By: Alcide CleverMark  Lukens [name]   On: 03/29/2019 15:26    Procedures  Procedures (including critical care time)  Medications Ordered in ED Medications  allopurinol (ZYLOPRIM) tablet 100 mg (100 mg Oral Given 03/29/19 2118)  carvedilol (COREG) tablet 25 mg (has no administration in time range)  docusate sodium (COLACE) capsule 100 mg (has no administration in time range)  ferrous sulfate tablet 325 mg (has no administration in time range)  gabapentin (NEURONTIN) capsule 100 mg (100 mg Oral Given 03/29/19 2117)  linagliptin (TRADJENTA) tablet 5 mg (has no administration in time range)  sacubitril-valsartan (ENTRESTO) 49-51 mg per tablet (has no administration in time range)  predniSONE (DELTASONE) tablet 10 mg (has no administration in time range)  rivaroxaban (XARELTO) tablet 20 mg (has no administration  in time range)  olopatadine (PATANOL) 0.1 % ophthalmic solution 1 drop (1 drop Both Eyes Given 03/29/19 2120)  leflunomide (ARAVA) tablet 20 mg (has no administration in time range)  isosorbide mononitrate (IMDUR) 24 hr tablet 30 mg (30 mg Oral Given 03/29/19 2118)  sodium chloride flush (NS) 0.9 % injection 3 mL (3 mLs Intravenous Given 03/29/19 2119)  acetaminophen (TYLENOL) tablet 650 mg (has no administration in time range)    Or  acetaminophen (TYLENOL) suppository 650 mg (has no administration in time range)  ondansetron (ZOFRAN) tablet 4 mg (has no administration in time range)    Or  ondansetron (ZOFRAN) injection 4 mg (has no administration in time range)  hydrALAZINE (APRESOLINE) tablet 100 mg (100 mg Oral Given 03/29/19 2117)  polyvinyl alcohol (LIQUIFILM TEARS) 1.4 % ophthalmic solution 1 drop (1 drop Both Eyes Given 03/29/19 2121)  feeding supplement (ENSURE ENLIVE) (ENSURE ENLIVE) liquid 237 mL (237 mLs Oral Given 03/29/19 2116)     Initial Impression / Assessment and Plan / ED Course  I have reviewed the triage vital signs and the nursing notes.  Pertinent labs & imaging results that were available during my care of the patient were reviewed by me and considered in my medical decision making (see chart for details).  Clinical Course as of Mar 28 2122  Wed Mar 29, 2019  [id number] 85 year old female who is currently at a rehab facility is here for increased lethargy.  I did wake the patient up but when I did she was able to answer questions easily and said she just felt really washed out this morning and could not do anything for herself.  She said this is happened before and they have never figured out why.  She currently denies any complaints.  She is getting some lab work urinalysis head CT chest x-ray.  Disposition per results of testing.   [MB]  1436 Hemoglobin(!): 8.5 [CG]  1436 At baseline   HCT(!): 25.5 [CG]  1449 Sodium(!): 123 [CG]  1449 Glucose(!): 122 [CG]  1510 Spoke to  [name], updated her    [CG]  19 Leukocytes,Ua(!): TRACE [CG]  1519 RBC / HPF: 0-5 [CG]  1519 WBC, UA: 0-5 [CG]  1519 Bacteria, UA: NONE SEEN [CG]  1547 Sodium(!): 122 [CG]  1547 pH, Ven(!): 7.439 [CG]  1547 pCO2, Ven(!): 39.0 [CG]  1547 pO2, Ven(!): 77.0 [CG]    Clinical Course User Index [CG] Liberty Handy, PA-C [MB] Terrilee Files, MD      85 yo brought just discharged from [facility] to home here for for  generalized weakness/FTT despite PT at facility.  Daughter POA concern physical strength worsening despite rehab/PT. Plan was to work with PT at rehab to at some point obtain back surgery. Ddx includes polypharmacy vs  electorlyte abnormalities vs symptomatic anemia vs occult infection.  Pt denies infectious symptoms. Pt denies CP, SOB, syncope, palpitations.  Leg weakness and somnolence on exam but no focal neuro deficits on exam here.  Spoke to daughter POA who states patient/strength has been declining since last hospital discharge in March, acute process such as CVA, infection considered less likely. No falls, fevers reported by RN at facility.  Less likely to be cardiac etiology.  Will obtain labs, infectious work up to evaluate for occult etiologies of weakness/FTT.   1558: Will hand patient off to oncoming EDPA.  Anticipate discussion with hospitalist for hyponatremia, confusion, failure to thrive.  Na 123 which would explain somnolence.  VBG unremarkable. CT head negative. UA and CXR w/o infection. Hgb at baseline. Polypharmacy (oxycodone/ativan) likely playing a large role in mentation.  Pt re-evaluated at transfer of care and appears slightly more alert, talking on the phone. VSS. Updated [name], states patient did not have PT/OT/CNA set up yet for home when she was dc from SNF.  Will benefit from social work.   Final Clinical Impressions(s) / ED Diagnoses   Final diagnoses:  Weakness    ED Discharge Orders    None        Jerrell Mylar 03/29/19 2123    Terrilee Files, MD 03/30/19 [phone]

## 2019-03-29 NOTE — ED Triage Notes (Signed)
Pt also reports she will have 24/7 services at home.

## 2019-03-29 NOTE — Telephone Encounter (Signed)
Went to call patient seen in chart where patient is at ED. Will call tomorrow and check on her.

## 2019-03-29 NOTE — Telephone Encounter (Signed)
Initial Comment Caller states her mother has been sick and weak recently, she states she is about to give up. She is unable to walk or move. She is on a [employer], and caller states they are neglecting her. Caller states her mother has been in this [employer] since March. She went there to get built up for a spinal surgery. She now has clearance from the heart doctor to have the spinal surgery, but it is not scheduled yet. She is taking pain pills. Daughter feels like they are neglecting her. States they don't have enough staff at the [facility] to take care of her. States she has not seen her mother in 3 months, and does not talk to the care givers. States she just wanted confirmation from someone that she is doing the right thing. States she cannot get another Rehab facility to accept her. They will only accept her from the hospital but not another Rehab facility. So she feels she must wait until she has the surgery to get her moved to another Rehab facility. Triage not done because daughter is not with her mother and has not seen her since March.   Forwarding to Dr. Earlene Plater

## 2019-03-29 NOTE — ED Notes (Signed)
Pt drowsy and nods off to sleep, wakens easily.  Able to answer questions appropriately,

## 2019-03-29 NOTE — ED Notes (Signed)
Report called to the floor. Will need to wait until Covid resulted.

## 2019-03-29 NOTE — ED Notes (Signed)
Cath done with (A).  Pt able to help roll and assist only minimally.  No outward distress noted.
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
## 2019-03-30 DIAGNOSIS — G9341 Metabolic encephalopathy: Secondary | ICD-10-CM | POA: Diagnosis present

## 2019-03-30 DIAGNOSIS — E1151 Type 2 diabetes mellitus with diabetic peripheral angiopathy without gangrene: Secondary | ICD-10-CM | POA: Diagnosis present

## 2019-03-30 DIAGNOSIS — T426X5A Adverse effect of other antiepileptic and sedative-hypnotic drugs, initial encounter: Secondary | ICD-10-CM | POA: Diagnosis present

## 2019-03-30 DIAGNOSIS — N183 Chronic kidney disease, stage 3 (moderate): Secondary | ICD-10-CM | POA: Diagnosis not present

## 2019-03-30 DIAGNOSIS — D638 Anemia in other chronic diseases classified elsewhere: Secondary | ICD-10-CM | POA: Diagnosis present

## 2019-03-30 DIAGNOSIS — G9340 Encephalopathy, unspecified: Secondary | ICD-10-CM | POA: Diagnosis not present

## 2019-03-30 DIAGNOSIS — R531 Weakness: Secondary | ICD-10-CM | POA: Diagnosis present

## 2019-03-30 DIAGNOSIS — G8929 Other chronic pain: Secondary | ICD-10-CM | POA: Diagnosis not present

## 2019-03-30 DIAGNOSIS — I5042 Chronic combined systolic (congestive) and diastolic (congestive) heart failure: Secondary | ICD-10-CM | POA: Diagnosis not present

## 2019-03-30 DIAGNOSIS — E1122 Type 2 diabetes mellitus with diabetic chronic kidney disease: Secondary | ICD-10-CM | POA: Diagnosis not present

## 2019-03-30 DIAGNOSIS — Z1159 Encounter for screening for other viral diseases: Secondary | ICD-10-CM | POA: Diagnosis not present

## 2019-03-30 DIAGNOSIS — E871 Hypo-osmolality and hyponatremia: Secondary | ICD-10-CM | POA: Diagnosis not present

## 2019-03-30 DIAGNOSIS — M2550 Pain in unspecified joint: Secondary | ICD-10-CM | POA: Diagnosis not present

## 2019-03-30 DIAGNOSIS — M069 Rheumatoid arthritis, unspecified: Secondary | ICD-10-CM | POA: Diagnosis present

## 2019-03-30 DIAGNOSIS — E785 Hyperlipidemia, unspecified: Secondary | ICD-10-CM | POA: Diagnosis present

## 2019-03-30 DIAGNOSIS — T424X5A Adverse effect of benzodiazepines, initial encounter: Secondary | ICD-10-CM | POA: Diagnosis present

## 2019-03-30 DIAGNOSIS — G92 Toxic encephalopathy: Secondary | ICD-10-CM | POA: Diagnosis not present

## 2019-03-30 DIAGNOSIS — M48061 Spinal stenosis, lumbar region without neurogenic claudication: Secondary | ICD-10-CM | POA: Diagnosis present

## 2019-03-30 DIAGNOSIS — I825Z1 Chronic embolism and thrombosis of unspecified deep veins of right distal lower extremity: Secondary | ICD-10-CM | POA: Diagnosis not present

## 2019-03-30 DIAGNOSIS — Z79891 Long term (current) use of opiate analgesic: Secondary | ICD-10-CM | POA: Diagnosis not present

## 2019-03-30 DIAGNOSIS — L89613 Pressure ulcer of right heel, stage 3: Secondary | ICD-10-CM | POA: Diagnosis not present

## 2019-03-30 DIAGNOSIS — I824Z1 Acute embolism and thrombosis of unspecified deep veins of right distal lower extremity: Secondary | ICD-10-CM | POA: Diagnosis present

## 2019-03-30 DIAGNOSIS — R5381 Other malaise: Secondary | ICD-10-CM | POA: Diagnosis not present

## 2019-03-30 DIAGNOSIS — M545 Low back pain: Secondary | ICD-10-CM | POA: Diagnosis not present

## 2019-03-30 DIAGNOSIS — M797 Fibromyalgia: Secondary | ICD-10-CM | POA: Diagnosis present

## 2019-03-30 DIAGNOSIS — I130 Hypertensive heart and chronic kidney disease with heart failure and stage 1 through stage 4 chronic kidney disease, or unspecified chronic kidney disease: Secondary | ICD-10-CM | POA: Diagnosis present

## 2019-03-30 DIAGNOSIS — R569 Unspecified convulsions: Secondary | ICD-10-CM | POA: Diagnosis present

## 2019-03-30 DIAGNOSIS — Z86711 Personal history of pulmonary embolism: Secondary | ICD-10-CM | POA: Diagnosis not present

## 2019-03-30 DIAGNOSIS — I420 Dilated cardiomyopathy: Secondary | ICD-10-CM | POA: Diagnosis present

## 2019-03-30 DIAGNOSIS — I2510 Atherosclerotic heart disease of native coronary artery without angina pectoris: Secondary | ICD-10-CM | POA: Diagnosis present

## 2019-03-30 DIAGNOSIS — R9431 Abnormal electrocardiogram [ECG] [EKG]: Secondary | ICD-10-CM | POA: Diagnosis present

## 2019-03-30 DIAGNOSIS — Z7401 Bed confinement status: Secondary | ICD-10-CM | POA: Diagnosis not present

## 2019-03-30 LAB — CBC
HCT: 23.3 % — ABNORMAL LOW (ref 36.0–46.0)
Hemoglobin: 7.9 g/dL — ABNORMAL LOW (ref 12.0–15.0)
MCH: 30.6 pg (ref 26.0–34.0)
MCHC: 33.9 g/dL (ref 30.0–36.0)
MCV: 90.3 fL (ref 80.0–100.0)
Platelets: 199 10*3/uL (ref 150–400)
RBC: 2.58 MIL/uL — ABNORMAL LOW (ref 3.87–5.11)
RDW: 17.6 % — ABNORMAL HIGH (ref 11.5–15.5)
WBC: 8.5 10*3/uL (ref 4.0–10.5)
nRBC: 0 % (ref 0.0–0.2)

## 2019-03-30 LAB — URINE CULTURE: Culture: NO GROWTH

## 2019-03-30 LAB — GLUCOSE, CAPILLARY
Glucose-Capillary: 101 mg/dL — ABNORMAL HIGH (ref 70–99)
Glucose-Capillary: 95 mg/dL (ref 70–99)
Glucose-Capillary: 134 mg/dL — ABNORMAL HIGH (ref 70–99)
Glucose-Capillary: 100 mg/dL — ABNORMAL HIGH (ref 70–99)

## 2019-03-30 LAB — BASIC METABOLIC PANEL
Anion gap: 7 (ref 5–15)
Anion gap: 9 (ref 5–15)
BUN: 18 mg/dL (ref 8–23)
BUN: 18 mg/dL (ref 8–23)
CO2: 24 mmol/L (ref 22–32)
CO2: 25 mmol/L (ref 22–32)
Calcium: 8.3 mg/dL — ABNORMAL LOW (ref 8.9–10.3)
Calcium: 8.5 mg/dL — ABNORMAL LOW (ref 8.9–10.3)
Chloride: 95 mmol/L — ABNORMAL LOW (ref 98–111)
Chloride: 92 mmol/L — ABNORMAL LOW (ref 98–111)
Creatinine, Ser: 0.82 mg/dL (ref 0.44–1.00)
Creatinine, Ser: 0.84 mg/dL (ref 0.44–1.00)
GFR calc Af Amer: >60 mL/min (ref >60)
GFR calc Af Amer: >60 mL/min (ref >60)
GFR calc non Af Amer: >60 mL/min (ref >60)
GFR calc non Af Amer: >60 mL/min (ref >60)
Glucose, Bld: 88 mg/dL (ref 70–99)
Glucose, Bld: 101 mg/dL — ABNORMAL HIGH (ref 70–99)
Potassium: 4.5 mmol/L (ref 3.5–5.1)
Potassium: 4.1 mmol/L (ref 3.5–5.1)
Sodium: 126 mmol/L — ABNORMAL LOW (ref 135–145)
Sodium: 126 mmol/L — ABNORMAL LOW (ref 135–145)

## 2019-03-30 MED ORDER — TRAMADOL HCL 50 MG PO TABS: 25.0000 mg | ORAL_TABLET | Freq: Three times a day (TID) | ORAL | Status: DC | PRN

## 2019-03-30 MED ORDER — TRAMADOL HCL 50 MG PO TABS
25.0000 mg | ORAL_TABLET | Freq: Three times a day (TID) | ORAL | Status: DC | PRN
  Administered 2019-04-02: 25 mg via ORAL
  Filled 2019-03-30: qty 1

## 2019-03-30 MED ORDER — LORAZEPAM 0.5 MG PO TABS
0.2500 mg | ORAL_TABLET | Freq: Every day | ORAL | Status: DC | PRN
  Administered 2019-04-02: 0.25 mg via ORAL
  Filled 2019-03-30: qty 1

## 2019-03-30 MED ORDER — ACETAMINOPHEN 500 MG PO TABS
500.0000 mg | ORAL_TABLET | Freq: Three times a day (TID) | ORAL | Status: DC
  Administered 2019-03-30 – 2019-04-03 (×14): 500 mg via ORAL
  Filled 2019-03-30 (×14): qty 1

## 2019-03-30 MED ORDER — LIDOCAINE 5 % EX PTCH
1.0000 | MEDICATED_PATCH | CUTANEOUS | Status: DC
  Administered 2019-03-30 – 2019-04-03 (×5): 1 via TRANSDERMAL
  Filled 2019-03-30 (×5): qty 1

## 2019-03-30 MED ORDER — LORAZEPAM 0.5 MG PO TABS: 0.2500 mg | ORAL_TABLET | ORAL | Status: DC | PRN

## 2019-03-30 NOTE — TOC Initial Note (Signed)
Transition of Care [facility]) - Initial/Assessment Note    Patient Details  Name: [name] [name] MRN: [id number] Date of Birth: 07-21-1930  [facility]) CM/SW Contact:    Kermit Balo, RN Phone Number: 03/30/2019, 4:06 PM  Clinical Narrative:                 Recommendations are for SNF. Pt was recently in [place] and d/ced yesterday. Lattie Corns (daughter) brought her straight to the hospital. Daughter doesn't want pt to return to [place]. She is requesting [facility] for SNF. CSW updated.   Expected Discharge Plan: Skilled Nursing Facility Barriers to Discharge: Continued Medical Work up   Patient Goals and CMS Choice Patient states their goals for this hospitalization and ongoing recovery are:: daughter: to get her moving around better CMS Medicare.gov Compare Post Acute Care list provided to:: Patient Represenative (must comment)(daughter) Choice offered to / list presented to : Adult Children  Expected Discharge Plan and Services Expected Discharge Plan: Skilled Nursing Facility       Living arrangements for the past 2 months: Skilled Nursing Facility([facility])                                      Prior Living Arrangements/Services Living arrangements for the past 2 months: Skilled Nursing Facility([facility])   Patient language and need for interpreter reviewed:: Yes(no needs)              Criminal Activity/Legal Involvement Pertinent to Current Situation/Hospitalization: No - Comment as needed  Activities of Daily Living      Permission Sought/Granted                  Emotional Assessment       Orientation: : Oriented to Self, Oriented to Place, Oriented to  Time, Oriented to Situation   Psych Involvement: No (comment)  Admission diagnosis:  Weakness [R53.1] Patient Active Problem List   Diagnosis Date Noted  . Acute metabolic encephalopathy 03/30/2019  . Pressure injury of skin 01/12/2019  . AMS (altered mental  status) 01/09/2019  . Rheumatoid arthritis flare (HCC) 12/23/2018  . Weakness of right lower extremity 12/23/2018  . Abscess of right upper extremity 12/14/2018  . Fever 12/13/2018  . Hand swelling 12/02/2018  . Pain, wrist 12/02/2018  . Hand pain 12/02/2018  . Polyarthritis 12/02/2018  . Spinal stenosis, lumbar 11/19/2018  . Acute encephalopathy 11/19/2018  . History of CVA (cerebrovascular accident) 11/19/2018  . Acute gout 11/15/2018  . Low back pain 11/14/2018  . Chronic back pain 10/27/2018  . Arthralgia of both knees 10/27/2018  . Rheumatoid arthritis involving both knees (HCC) 10/27/2018  . Acute pain 10/21/2018  . Physical deconditioning 09/06/2018  . Hyperlipidemia associated with type 2 diabetes mellitus (HCC), refuses statin 09/06/2018  . Refusal of statin medication by patient 09/04/2018  . Deep vein thrombosis (DVT) of distal vein of right lower extremity (HCC) 07/30/2018  . Hypertensive heart disease with heart failure (HCC) 07/30/2018  . Pulmonary embolism (HCC), 07/2018, on Xarelto with goal to DC after 6 months if more active 07/10/2018  . DCM (dilated cardiomyopathy) (HCC)   . Benign hypertensive heart and kidney disease with CHF and stage 3 chronic kidney disease (HCC) 05/28/2018  . Osteoarthritis 05/06/2018  . Hypokalemia 02/02/2018  . Adhesive capsulitis of right shoulder 10/12/2017  . Idiopathic chronic gout of multiple sites with tophus 08/25/2017  . History of total  knee arthroplasty, bilateral 02/18/2017  . History of rotator cuff surgery 02/18/2017  . High risk medications, long-term use 09/21/2016  . Chronic combined systolic and diastolic CHF (congestive heart failure) (HCC) 09/12/2015  . Bilateral edema of lower extremity 06/05/2014  . CKD stage 3 due to type 2 diabetes mellitus (HCC) 06/05/2014  . DM (diabetes mellitus), type 2 with renal complications (HCC) 06/05/2014    Class: Chronic  . Anemia of chronic disease 05/01/2014  . CAD (coronary artery  disease) 10/18/2013  . Hypertension associated with diabetes (HCC) 08/08/2013    Class: Chronic  . Fibromyalgia 05/10/2013  . Type 2 diabetes mellitus with hyperlipidemia (HCC), patient declines statin 04/27/2007  . Allergic rhinitis 04/27/2007  . Diverticulosis 04/27/2007   PCP:  Helane Rima, DO Pharmacy:  No Pharmacies Listed    Social Determinants of Health (SDOH) Interventions    Readmission Risk Interventions No flowsheet data found.

## 2019-03-30 NOTE — Progress Notes (Addendum)
PROGRESS NOTE    [name] [name]  FEO:[id number] DOB: 07/06/1930 DOA: 03/29/2019 PCP: Helane Rima, DO    Brief Narrative; 85 year old with past medical history significant for hypertension, diabetes, CVA, seizure, coronary artery disease and CHF who presented with generalized weakness altered mental status confusion probably related to polypharmacy and hyponatremia.  Patient was discharged as needed from her last hospitalization, she was eager to sleepy or in too much pain to be able to participate with physical therapist.  Patient recently discharged from a skilled nursing facility to home.  She was brought by family to the hospital with concern of confusion, generalized weakness, altered mental status. Evaluation in the ED patient was noted to have a sodium of 123 and noted to be lethargic.   Assessment & Plan:   Principal Problem:   Acute encephalopathy Active Problems:   Fibromyalgia   CKD stage 3 due to type 2 diabetes mellitus (HCC)   DM (diabetes mellitus), type 2 with renal complications (HCC)   Chronic combined systolic and diastolic CHF (congestive heart failure) (HCC)   Deep vein thrombosis (DVT) of distal vein of right lower extremity (HCC)   Hypertensive heart disease with heart failure (HCC)   Physical deconditioning   Chronic back pain   Rheumatoid arthritis involving both knees (HCC)   History of CVA (cerebrovascular accident)   1-Acute toxic encephalopathy probably related to polypharmacy 2 med effect medications; gabapentin dose reduce.   She appears more alert today. Need to monitor sodium level. Patient has been on ativan for some time now. Will resume PRN to avoid withdrawal.  Will add low dose tramadol for  severe pain.   2-Hyponatremia:  Awaiting urine sodium and urine osmolality Improved with holding Lasix. Will need to monitor on Entresto. Repeat labs in the morning.  3-physical deconditioning, chronic back pain, spinal stenosis: Patient is  supposed to follow with orthopedic for surgery as an outpatient. Her deconditioning is progressively worsening due to pain as well as pain medication. Continue to hold opioid.  Will start lidocaine patch to see if that will help with pain regimen. Monitor on lidocaine patch today. Will add low dose tramadol for severe pain.  Will let Dr Juliene Pina patient is here per family request. I spoke with Dr Mitchell Heir, he is recommending for patient to follow with him in the office, get over acute illness, improvement of sodium level. PT weightbearing as tolerated.   History of DVT: Continue with Xarelto  Chronic combined CHF: Compensated Continue with Coreg, hydralazine, Imdur, Entresto.  Holding Lasix due to hyponatremia.  Diabetes type 2: Continue with a sliding scale insulin.  Chronic rheumatoid arthritis: Holding Plaquenil due to prolonged QT.  Continue with a bottle continue with prednisone.  History of gout: Continue with allopurinol  Anemia: Suspect anemia of chronic disease.  Repeat hemoglobin in the morning   Pressure Injury 01/09/19 Deep Tissue Injury - Purple or maroon localized area of discolored intact skin or blood-filled blister due to damage of underlying soft tissue from pressure and/or shear. (Active)  01/09/19 1837  Location: Heel  Location Orientation: Right  Staging: Deep Tissue Injury - Purple or maroon localized area of discolored intact skin or blood-filled blister due to damage of underlying soft tissue from pressure and/or shear.  Wound Description (Comments):   Present on Admission: Yes     Estimated body mass index is 30.77 kg/m as calculated from the following:   Height as of this encounter: 5\' 4"  (1.626 m).   Weight as of this  encounter: 81.3 kg.   DVT prophylaxis: On Xarelto Code Status: Full code Family Communication: We will update daughter Disposition Plan: We will need to observe patient in the hospital to monitor sodium level on resumption of Entresto.   We will also need to monitor mental status, patient will be started on lidocaine patch.  Consultants:  None  Procedures:   None   Antimicrobials: None   Subjective: Patient is alert and conversant today.  She is complaining of back pain.  She has not been able to ambulate since February.  He is having a lot of back pain is not able to ambulate because of that.  Also at times she is very sleepy.  Objective: Vitals:   03/29/19 1954 03/29/19 2320 03/30/19 0303 03/30/19 0758  BP: (!) 154/82 131/74 130/70 (!) 151/85  Pulse: 97 93 91 89  Resp: 18 18 18 20   Temp: 99.1 F (37.3 C) 98.4 F (36.9 C) 98.1 F (36.7 C) 98.3 F (36.8 C)  TempSrc: Oral Oral Oral Oral  SpO2: 97% 97% 98% 99%  Weight: 81.2 kg  81.3 kg   Height: 5\' 4"  (1.626 m)       Intake/Output Summary (Last 24 hours) at 03/30/2019 0845 Last data filed at 03/29/2019 2320 Gross per 24 hour  Intake 240 ml  Output 200 ml  Net 40 ml   Filed Weights   03/29/19 1243 03/29/19 1954 03/30/19 0303  Weight: 81.6 kg 81.2 kg 81.3 kg    Examination:  General exam: Appears calm and comfortable  Respiratory system: Clear to auscultation. Respiratory effort normal. Cardiovascular system: S1 & S2 heard, RRR. No JVD, murmurs, rubs, gallops or clicks. No pedal edema. Gastrointestinal system: Abdomen is nondistended, soft and nontender. No organomegaly or masses felt. Normal bowel sounds heard. Central nervous system: Alert and oriented.  Extremities: Symmetric 5 x 5 power. Skin: No rashes, lesions or ulcers    Data Reviewed: I have personally reviewed following labs and imaging studies  CBC: Recent Labs  Lab 03/29/19 1400 03/29/19 1537 03/30/19 0358  WBC 9.1  --  8.5  NEUTROABS 7.8*  --   --   HGB 8.5* 8.8* 7.9*  HCT 25.5* 26.0* 23.3*  MCV 92.4  --  90.3  PLT 231  --  199   Basic Metabolic Panel: Recent Labs  Lab 03/29/19 1400 03/29/19 1537 03/29/19 1717 03/29/19 2129 03/30/19 0358  NA 123* 122* 123* 124*  126*  K 4.9 4.8 4.8  --  4.5  CL 92*  --  90*  --  95*  CO2 22  --  23  --  24  GLUCOSE 122*  --  111*  --  88  BUN 16  --  16  --  18  CREATININE 0.90  --  0.81  --  0.82  CALCIUM 8.3*  --  8.6*  --  8.3*   GFR: Estimated Creatinine Clearance: 50.8 mL/min (by C-G formula based on SCr of 0.82 mg/dL). Liver Function Tests: No results for input(s): AST, ALT, ALKPHOS, BILITOT, PROT, ALBUMIN in the last 168 hours. No results for input(s): LIPASE, AMYLASE in the last 168 hours. Recent Labs  Lab 03/29/19 1717  AMMONIA 18   Coagulation Profile: No results for input(s): INR, PROTIME in the last 168 hours. Cardiac Enzymes: Recent Labs  Lab 03/29/19 1540  TROPONINI 0.05*   BNP (last 3 results) No results for input(s): PROBNP in the last 8760 hours. HbA1C: No results for input(s): HGBA1C in the last 72  hours. CBG: Recent Labs  Lab 03/30/19 0611  GLUCAP 101*   Lipid Profile: No results for input(s): CHOL, HDL, LDLCALC, TRIG, CHOLHDL, LDLDIRECT in the last 72 hours. Thyroid Function Tests: Recent Labs    03/29/19 1717  TSH 1.521   Anemia Panel: Recent Labs    03/29/19 1717  VITAMINB12 531   Sepsis Labs: No results for input(s): PROCALCITON, LATICACIDVEN in the last 168 hours.  Recent Results (from the past 240 hour(s))  SARS Coronavirus 2 (CEPHEID - Performed in [facility] hospital lab), Hosp Order     Status: None   Collection Time: 03/29/19  5:11 PM  Result Value Ref Range Status   SARS Coronavirus 2 NEGATIVE NEGATIVE Final    Comment: (NOTE) If result is NEGATIVE SARS-CoV-2 target nucleic acids are NOT DETECTED. The SARS-CoV-2 RNA is generally detectable in upper and lower  respiratory specimens during the acute phase of infection. The lowest  concentration of SARS-CoV-2 viral copies this assay can detect is 250  copies / mL. A negative result does not preclude SARS-CoV-2 infection  and should not be used as the sole basis for treatment or other  patient  management decisions.  A negative result may occur with  improper specimen collection / handling, submission of specimen other  than nasopharyngeal swab, presence of viral mutation(s) within the  areas targeted by this assay, and inadequate number of viral copies  (<250 copies / mL). A negative result must be combined with clinical  observations, patient history, and epidemiological information. If result is POSITIVE SARS-CoV-2 target nucleic acids are DETECTED. The SARS-CoV-2 RNA is generally detectable in upper and lower  respiratory specimens dur ing the acute phase of infection.  Positive  results are indicative of active infection with SARS-CoV-2.  Clinical  correlation with patient history and other diagnostic information is  necessary to determine patient infection status.  Positive results do  not rule out bacterial infection or co-infection with other viruses. If result is PRESUMPTIVE POSTIVE SARS-CoV-2 nucleic acids MAY BE PRESENT.   A presumptive positive result was obtained on the submitted specimen  and confirmed on repeat testing.  While 2019 novel coronavirus  (SARS-CoV-2) nucleic acids may be present in the submitted sample  additional confirmatory testing may be necessary for epidemiological  and / or clinical management purposes  to differentiate between  SARS-CoV-2 and other Sarbecovirus currently known to infect humans.  If clinically indicated additional testing with an alternate test  methodology [phone]) is advised. The SARS-CoV-2 RNA is generally  detectable in upper and lower respiratory sp ecimens during the acute  phase of infection. The expected result is Negative. Fact Sheet for Patients:  BoilerBrush.com.cy Fact Sheet for Healthcare Providers: https://pope.com/ This test is not yet approved or cleared by the [place] FDA and has been authorized for detection and/or diagnosis of SARS-CoV-2 by FDA under  an Emergency Use Authorization (EUA).  This EUA will remain in effect (meaning this test can be used) for the duration of the COVID-19 declaration under Section 564(b)(1) of the Act, 21 U.S.C. section 360bbb-3(b)(1), unless the authorization is terminated or revoked sooner. Performed at [facility], [address] [place], [place] [address]          Radiology Studies: Ct Head Wo Contrast  Result Date: 03/29/2019 CLINICAL DATA:  Weakness EXAM: CT HEAD WITHOUT CONTRAST TECHNIQUE: Contiguous axial images were obtained from the base of the skull through the vertex without intravenous contrast. COMPARISON:  Head CT 01/09/2019 FINDINGS: Brain:  There is no mass, hemorrhage or extra-axial collection. The size and configuration of the ventricles and extra-axial CSF spaces are normal. There is hypoattenuation of the white matter, most commonly indicating chronic small vessel disease. Vascular: No abnormal hyperdensity of the major intracranial arteries or dural venous sinuses. No intracranial atherosclerosis. Skull: The visualized skull base, calvarium and extracranial soft tissues are normal. Sinuses/Orbits: No fluid levels or advanced mucosal thickening of the visualized paranasal sinuses. No mastoid or middle ear effusion. The orbits are normal. IMPRESSION: Chronic small vessel ischemia without acute intracranial abnormality. Electronically Signed   By: Deatra Robinson [name]   On: 03/29/2019 15:56   Dg Chest Portable 1 View  Result Date: 03/29/2019 CLINICAL DATA:  Increased somnolence EXAM: PORTABLE CHEST 1 VIEW COMPARISON:  01/09/2019 FINDINGS: Cardiac shadow remains enlarged. Tortuous aorta with calcifications is again noted. No focal infiltrate or sizable effusion is seen. No significant vascular congestion is noted. No bony abnormality is noted. IMPRESSION: No acute abnormality noted. Electronically Signed   By: Alcide Clever [name]   On: 03/29/2019 15:26        Scheduled Meds: .  allopurinol  100 mg Oral QHS  . carvedilol  25 mg Oral BID WC  . feeding supplement (ENSURE ENLIVE)  237 mL Oral TID BM  . [START ON 03/31/2019] ferrous sulfate  325 mg Oral QODAY  . gabapentin  100 mg Oral TID  . hydrALAZINE  100 mg Oral TID  . isosorbide mononitrate  30 mg Oral QHS  . leflunomide  20 mg Oral Daily  . linagliptin  5 mg Oral Daily  . olopatadine  1 drop Both Eyes BID  . polyvinyl alcohol  1 drop Both Eyes Daily  . predniSONE  10 mg Oral Q breakfast  . rivaroxaban  20 mg Oral Q supper  . sacubitril-valsartan  1 tablet Oral Q1200  . sodium chloride flush  3 mL Intravenous Q12H   Continuous Infusions:   LOS: 0 days    Time spent: 35 minutes     Alba Cory, MD [facility] Pager [phone]  If 7PM-7AM, please contact night-coverage [email or web address] Password [facility] 03/30/2019, 8:45 AM

## 2019-03-30 NOTE — Plan of Care (Signed)
Progressing towards goals

## 2019-03-30 NOTE — Evaluation (Signed)
Occupational Therapy Evaluation Patient Details Name: [name] [name] Scharnhorst MRN: [id number] DOB: January 22, 1930 Today's Date: 03/30/2019    History of Present Illness [name] [name] Linsey is a 85 y.o. female with PMH of HTN; DM; CVA; seizures; CAD; and CHF. The patient is presenting with generalized weakness.and acute confusion.   Clinical Impression   Pt admitted with the above diagnoses and presents with below problem list. Pt will benefit from continued acute OT to address the below listed deficits and maximize independence with basic ADLs. At baseline, pt with longstanding dependence in bathing, dressing and toileting, could self feed and groom and participate in UB dressing; was able to complete transfers and take a few steps with therapy. Pt presents with further decline in functional assist level. Currently pt is total A for most ADLs, likely setup to min A with eating at bed level (or supported sitting position). +2 max to total physical assist for supine<>EOB.  Attempted 2x to complete partial stand with pt unable to clear hips from bed. Pt currently dependent for transfers and will need hoyer lift of OOB. Pt was able to sit EOB a few minutes with min to mod A.      Follow Up Recommendations  SNF    Equipment Recommendations  Other (comment)(defer to next venue)    Recommendations for Other Services       Precautions / Restrictions Precautions Precautions: Fall Required Braces or Orthoses: Other Brace Other Brace: prevalon boot on R foot Restrictions Weight Bearing Restrictions: No      Mobility Bed Mobility Overal bed mobility: Needs Assistance Bed Mobility: Supine to Sit;Sit to Supine     Supine to sit: Max assist;+2 for physical assistance;HOB elevated Sit to supine: Total assist   General bed mobility comments: Pt able to assist minimally with trunk control during powerup to EOB. Assist for BUE advancement, powering up trunk, and used pad to faciliatate advancing/pivoting  hips.  Transfers                 General transfer comment: attempted 2x. pt unable to clear hips from bed. Could not partial stand this session. Recommend Hoyer lift for now.     Balance Overall balance assessment: Needs assistance Sitting-balance support: Single extremity supported;Feet supported Sitting balance-Leahy Scale: Poor Sitting balance - Comments: LUE support and min to mod A to steady Postural control: Posterior lean(fatigue?)                                 ADL either performed or assessed with clinical judgement   ADL Overall ADL's : Needs assistance/impaired Eating/Feeding: Set up;Bed level;Minimal assistance   Grooming: Sitting;Total assistance   Upper Body Bathing: Total assistance;Sitting   Lower Body Bathing: Total assistance;Bed level   Upper Body Dressing : Total assistance;Sitting   Lower Body Dressing: Total assistance;Bed level                 General ADL Comments: Pt completed bed mobility and attempted (unsusscessfully) to stand from EOB. Able to sit EOB a few minutes with LUE support and min -mod A.     Vision         Perception     Praxis      Pertinent Vitals/Pain Pain Assessment: Faces Faces Pain Scale: Hurts a little bit Pain Location: RLE with movement, grimacing with bed mobility Pain Descriptors / Indicators: Aching Pain Intervention(s): Limited activity within patient's tolerance;Monitored during session;Repositioned  Hand Dominance Right   Extremity/Trunk Assessment Upper Extremity Assessment Upper Extremity Assessment: RUE deficits/detail;Generalized weakness RUE Deficits / Details: limited AROM. assisted to position R hand onto walker in seated position  RUE Coordination: decreased gross motor;decreased fine motor   Lower Extremity Assessment Lower Extremity Assessment: Defer to PT evaluation       Communication Communication Communication: No difficulties   Cognition  Arousal/Alertness: Awake/alert Behavior During Therapy: Flat affect Overall Cognitive Status: No family/caregiver present to determine baseline cognitive functioning                                 General Comments: minimal verbalizations. very flat affect   General Comments       Exercises     Shoulder Instructions      Home Living Family/patient expects to be discharged to:: Skilled nursing facility                                        Prior Functioning/Environment Level of Independence: Needs assistance  Gait / Transfers Assistance Needed: From previous encounter in March: Pt states she had gotten to the place where she could take a few steps with therapy, but otherwise was at a transfers only level. ADL's / Homemaking Assistance Needed: From previous encounter in March: Pt with longstanding dependence in bathing, dressing and toileting, could self feed and groom and participate in UB dressing            OT Problem List: Decreased strength;Decreased range of motion;Decreased activity tolerance;Impaired balance (sitting and/or standing);Decreased coordination;Decreased knowledge of use of DME or AE;Decreased knowledge of precautions;Impaired tone;Obesity;Impaired UE functional use;Pain      OT Treatment/Interventions: Self-care/ADL training;Therapeutic exercise;Neuromuscular education;DME and/or AE instruction;Therapeutic activities;Patient/family education;Balance training    OT Goals(Current goals can be found in the care plan section) Acute Rehab OT Goals Patient Stated Goal: get stronger OT Goal Formulation: With patient Time For Goal Achievement: 04/13/19 Potential to Achieve Goals: Good ADL Goals Pt Will Perform Eating: with set-up;sitting Pt/caregiver will Perform Home Exercise Program: Increased strength;Increased ROM;Right Upper extremity;Both right and left upper extremity;With theraband;With written HEP provided Additional ADL  Goal #1: Pt will complete bed mobility at min +2 assist to prepare for EOB ADLs. Additional ADL Goal #2: Pt will sit EOB for 5 min with min guard assist to facilitate increased independence with EOB ADLs.  OT Frequency: Min 2X/week   Barriers to D/[name]:            Co-evaluation PT/OT/SLP Co-Evaluation/Treatment: Yes Reason for Co-Treatment: For patient/therapist safety;To address functional/ADL transfers   OT goals addressed during session: ADL's and self-care      AM-PAC OT "6 Clicks" Daily Activity     Outcome Measure Help from another person eating meals?: A Lot Help from another person taking care of personal grooming?: Total Help from another person toileting, which includes using toliet, bedpan, or urinal?: Total Help from another person bathing (including washing, rinsing, drying)?: Total Help from another person to put on and taking off regular upper body clothing?: Total Help from another person to put on and taking off regular lower body clothing?: Total 6 Click Score: 7   End of Session Equipment Utilized During Treatment: Gait belt;Rolling walker Nurse Communication: Other (comment)(NT present throughout session. )  Activity Tolerance: Patient tolerated treatment well;Patient limited by fatigue Patient left:  in bed;with call bell/phone within reach;Other (comment)(with PT and nurse tech present)  OT Visit Diagnosis: Other abnormalities of gait and mobility (R26.89);Muscle weakness (generalized) (M62.81);Other symptoms and signs involving cognitive function;Pain Pain - Right/Left: Right Pain - part of body: Leg;Ankle and joints of foot                Time: 2979-8921 OT Time Calculation (min): 22 min Charges:  OT General Charges $OT Visit: 1 Visit OT Evaluation $OT Eval Low Complexity: 1 Low  Raynald Kemp, OT Acute Rehabilitation Services Pager: [phone] Office: [phone]   Pilar Grammes 03/30/2019, 12:08 PM

## 2019-03-30 NOTE — Progress Notes (Signed)
Occupational Therapy Treatment Patient Details Name: [name] [name] MRN: [id number] DOB: Oct 02, 1930 Today's Date: 03/30/2019    History of present illness [name] [name] is a 85 y.o. female with PMH of HTN; DM; CVA; seizures; CAD; and CHF. The patient is presenting with generalized weakness.and acute confusion.   OT comments  Pt seen for follow-up session to address UB strengthening. Provided pt with level 1 theraband attached to bed rail. Pt completed UB exercises in horizontal plane. Setup items on meal tray at end of session. Pt able to self feed. D/c plan remains appropriate.    Follow Up Recommendations  SNF    Equipment Recommendations  Other (comment)(defer to next venue)    Recommendations for Other Services      Precautions / Restrictions Precautions Precautions: Fall Required Braces or Orthoses: Other Brace Other Brace: prevalon boot on R foot Restrictions Weight Bearing Restrictions: No       Mobility Bed Mobility Overal bed mobility: Needs Assistance Bed Mobility: Supine to Sit;Sit to Supine     Supine to sit: Max assist;+2 for physical assistance;HOB elevated Sit to supine: Total assist   General bed mobility comments: Patient was able to participate in moving to the edge of bed but still required max a. She required total assist to transfer back to supine.   Transfers Overall transfer level: Needs assistance   Transfers: Sit to/from Stand Sit to Stand: Total assist;+2 physical assistance;+2 safety/equipment         General transfer comment: attmepted 2x but could not clear patients hips from the bed. Could not get pateints hips up even enough for equipment    Balance Overall balance assessment: Needs assistance Sitting-balance support: Single extremity supported;Feet supported Sitting balance-Leahy Scale: Poor Sitting balance - Comments: LUE support and min to mod A to steady Postural control: Posterior lean                                  ADL either performed or assessed with clinical judgement   ADL Overall ADL's : Needs assistance/impaired Eating/Feeding: Set up;Bed level   Grooming: Sitting;Total assistance   Upper Body Bathing: Total assistance;Sitting   Lower Body Bathing: Total assistance;Bed level   Upper Body Dressing : Total assistance;Sitting   Lower Body Dressing: Total assistance;Bed level                 General ADL Comments: Setup pt's meal tray, lids, and utensils. Pt able to self feed.     Vision       Perception     Praxis      Cognition Arousal/Alertness: Awake/alert Behavior During Therapy: Flat affect Overall Cognitive Status: No family/caregiver present to determine baseline cognitive functioning                                 General Comments: minimal verbalizations. very flat affect        Exercises Exercises: General Upper Extremity;Other exercises Other Exercises Other Exercises: Setup level 1 theraband on upper bed rails. Pt completed 10 reps each UE in horizontal plane. Good toleration. Reports she worked with theraband at [facility].   Shoulder Instructions       General Comments      Pertinent Vitals/ Pain       Pain Assessment: Faces Pain Score: 2  Faces Pain Scale: Hurts a little bit Pain Location:  unspecified Pain Descriptors / Indicators: Aching Pain Intervention(s): Monitored during session  Home Living Family/patient expects to be discharged to:: Skilled nursing facility                                        Prior Functioning/Environment Level of Independence: Needs assistance  Gait / Transfers Assistance Needed: From previous encounter in March: Pt states she had gotten to the place where she could take a few steps with therapy, but otherwise was at a transfers only level. ADL's / Homemaking Assistance Needed: From previous encounter in March: Pt with longstanding dependence in bathing, dressing and toileting,  could self feed and groom and participate in UB dressing       Frequency  Min 2X/week        Progress Toward Goals  OT Goals(current goals can now be found in the care plan section)  Progress towards OT goals: Progressing toward goals  Acute Rehab OT Goals Patient Stated Goal: get stronger OT Goal Formulation: With patient Time For Goal Achievement: 04/13/19 Potential to Achieve Goals: Good ADL Goals Pt Will Perform Eating: with set-up;sitting Pt/caregiver will Perform Home Exercise Program: Increased strength;Increased ROM;Right Upper extremity;Both right and left upper extremity;With theraband;With written HEP provided Additional ADL Goal #1: Pt will complete bed mobility at min +2 assist to prepare for EOB ADLs. Additional ADL Goal #2: Pt will sit EOB for 5 min with min guard assist to facilitate increased independence with EOB ADLs.  Plan Discharge plan remains appropriate    Co-evaluation    PT/OT/SLP Co-Evaluation/Treatment: Yes Reason for Co-Treatment: Complexity of the patient's impairments (multi-system involvement);For patient/therapist safety;Necessary to address cognition/behavior during functional activity;To address functional/ADL transfers PT goals addressed during session: Balance;Mobility/safety with mobility;Proper use of DME;Strengthening/ROM OT goals addressed during session: ADL's and self-care      AM-PAC OT "6 Clicks" Daily Activity     Outcome Measure   Help from another person eating meals?: A Lot Help from another person taking care of personal grooming?: Total Help from another person toileting, which includes using toliet, bedpan, or urinal?: Total Help from another person bathing (including washing, rinsing, drying)?: Total Help from another person to put on and taking off regular upper body clothing?: Total Help from another person to put on and taking off regular lower body clothing?: Total 6 Click Score: 7    End of Session Equipment  Utilized During Treatment: Gait belt;Rolling walker  OT Visit Diagnosis: Other abnormalities of gait and mobility (R26.89);Muscle weakness (generalized) (M62.81);Other symptoms and signs involving cognitive function;Pain Pain - Right/Left: Right Pain - part of body: Leg;Ankle and joints of foot   Activity Tolerance Patient tolerated treatment well   Patient Left in bed;with call bell/phone within reach;with bed alarm set   Nurse Communication Other (comment)(NT present throughout session. )        Time: 0383-3383 OT Time Calculation (min): 15 min  Charges: OT General Charges $OT Visit: 1 Visit OT Evaluation $OT Eval Low Complexity: 1 Low OT Treatments $Therapeutic Exercise: 8-22 mins  Raynald Kemp, OT Acute Rehabilitation Services Pager: [phone] Office: [phone]    [name] [name] 03/30/2019, 2:26 PM

## 2019-03-30 NOTE — NC FL2 (Signed)
[place] MEDICAID FL2 LEVEL OF CARE SCREENING TOOL     IDENTIFICATION  Patient Name: [name] [name] Birthdate: [birth year] Sex: female Admission Date (Current Location): 03/29/2019  [facility] and [place] Number:  Producer, television/film/video and Address:  [facility], [address], [place], [place] [address]      Provider Number: [id number]  Attending Physician Name and Address:  Alba Cory, MD  Relative Name and Phone Number:       Current Level of Care: Hospital Recommended Level of Care: Skilled Nursing Facility Prior Approval Number:    Date Approved/Denied:   PASRR Number: [id number]  Discharge Plan: SNF    Current Diagnoses: Patient Active Problem List   Diagnosis Date Noted  . Acute metabolic encephalopathy 03/30/2019  . Pressure injury of skin 01/12/2019  . AMS (altered mental status) 01/09/2019  . Rheumatoid arthritis flare (HCC) 12/23/2018  . Weakness of right lower extremity 12/23/2018  . Abscess of right upper extremity 12/14/2018  . Fever 12/13/2018  . Hand swelling 12/02/2018  . Pain, wrist 12/02/2018  . Hand pain 12/02/2018  . Polyarthritis 12/02/2018  . Spinal stenosis, lumbar 11/19/2018  . Acute encephalopathy 11/19/2018  . History of CVA (cerebrovascular accident) 11/19/2018  . Acute gout 11/15/2018  . Low back pain 11/14/2018  . Chronic back pain 10/27/2018  . Arthralgia of both knees 10/27/2018  . Rheumatoid arthritis involving both knees (HCC) 10/27/2018  . Acute pain 10/21/2018  . Physical deconditioning 09/06/2018  . Hyperlipidemia associated with type 2 diabetes mellitus (HCC), refuses statin 09/06/2018  . Refusal of statin medication by patient 09/04/2018  . Deep vein thrombosis (DVT) of distal vein of right lower extremity (HCC) 07/30/2018  . Hypertensive heart disease with heart failure (HCC) 07/30/2018  . Pulmonary embolism (HCC), 07/2018, on Xarelto with goal to DC after 6 months if more active 07/10/2018   . DCM (dilated cardiomyopathy) (HCC)   . Benign hypertensive heart and kidney disease with CHF and stage 3 chronic kidney disease (HCC) 05/28/2018  . Osteoarthritis 05/06/2018  . Hypokalemia 02/02/2018  . Adhesive capsulitis of right shoulder 10/12/2017  . Idiopathic chronic gout of multiple sites with tophus 08/25/2017  . History of total knee arthroplasty, bilateral 02/18/2017  . History of rotator cuff surgery 02/18/2017  . High risk medications, long-term use 09/21/2016  . Chronic combined systolic and diastolic CHF (congestive heart failure) (HCC) 09/12/2015  . Bilateral edema of lower extremity 06/05/2014  . CKD stage 3 due to type 2 diabetes mellitus (HCC) 06/05/2014  . DM (diabetes mellitus), type 2 with renal complications (HCC) 06/05/2014    Class: Chronic  . Anemia of chronic disease 05/01/2014  . CAD (coronary artery disease) 10/18/2013  . Hypertension associated with diabetes (HCC) 08/08/2013    Class: Chronic  . Fibromyalgia 05/10/2013  . Type 2 diabetes mellitus with hyperlipidemia (HCC), patient declines statin 04/27/2007  . Allergic rhinitis 04/27/2007  . Diverticulosis 04/27/2007    Orientation RESPIRATION BLADDER Height & Weight     Self, Time, Situation, Place  Normal Continent Weight: 179 lb 3.7 oz (81.3 kg) Height:  5\' 4"  (162.6 cm)  BEHAVIORAL SYMPTOMS/MOOD NEUROLOGICAL BOWEL NUTRITION STATUS      Continent Diet(see DC summary)  AMBULATORY STATUS COMMUNICATION OF NEEDS Skin   Extensive Assist Verbally Normal                       Personal Care Assistance Level of Assistance  Bathing, Feeding, Dressing Bathing  Assistance: Maximum assistance Feeding assistance: Limited assistance Dressing Assistance: Maximum assistance     Functional Limitations Info  Sight, Hearing, Speech Sight Info: Adequate Hearing Info: Adequate Speech Info: Adequate    SPECIAL CARE FACTORS FREQUENCY  PT (By licensed PT), OT (By licensed OT)     PT Frequency:  5x/wk OT Frequency: 5x/wk            Contractures Contractures Info: Not present    Additional Factors Info  Code Status, Allergies Code Status Info: Full Allergies Info: Clonidine Derivatives, Fish Allergy, Shellfish Allergy, Doxycycline, Indomethacin, Lyrica Pregabalin, Methyldopa, Morphine And Related, Orange Fruit Citrus, Strawberry (Diagnostic), Cetirizine Hcl, Codeine, Levaquin Levofloxacin In D5w, Tomato           Current Medications (03/30/2019):  This is the current hospital active medication list Current Facility-Administered Medications  Medication Dose Route Frequency Provider Last Rate Last Dose  . acetaminophen (TYLENOL) tablet 650 mg  650 mg Oral Q6H PRN Rolly Salter, MD       Or  . acetaminophen (TYLENOL) suppository 650 mg  650 mg Rectal Q6H PRN Rolly Salter, MD      . acetaminophen (TYLENOL) tablet 500 mg  500 mg Oral TID Regalado, Belkys A, MD   500 mg at 03/30/19 0930  . allopurinol (ZYLOPRIM) tablet 100 mg  100 mg Oral QHS Rolly Salter, MD   100 mg at 03/29/19 2118  . carvedilol (COREG) tablet 25 mg  25 mg Oral BID WC Rolly Salter, MD   25 mg at 03/30/19 0930  . docusate sodium (COLACE) capsule 100 mg  100 mg Oral Daily PRN Rolly Salter, MD      . feeding supplement (ENSURE ENLIVE) (ENSURE ENLIVE) liquid 237 mL  237 mL Oral TID BM Mosetta Anis, RPH   237 mL at 03/30/19 0932  . [START ON 03/31/2019] ferrous sulfate tablet 325 mg  325 mg Oral Mardene Celeste, MD      . gabapentin (NEURONTIN) capsule 100 mg  100 mg Oral TID Rolly Salter, MD   100 mg at 03/30/19 0930  . hydrALAZINE (APRESOLINE) tablet 100 mg  100 mg Oral TID Rolly Salter, MD   100 mg at 03/30/19 0931  . isosorbide mononitrate (IMDUR) 24 hr tablet 30 mg  30 mg Oral QHS Rolly Salter, MD   30 mg at 03/29/19 2118  . leflunomide (ARAVA) tablet 20 mg  20 mg Oral Daily Rolly Salter, MD   20 mg at 03/30/19 0931  . lidocaine (LIDODERM) 5 % 1 patch  1 patch Transdermal  Q24H Regalado, Belkys A, MD   1 patch at 03/30/19 0931  . linagliptin (TRADJENTA) tablet 5 mg  5 mg Oral Daily Rolly Salter, MD   5 mg at 03/30/19 0932  . LORazepam (ATIVAN) tablet 0.25 mg  0.25 mg Oral Daily PRN Regalado, Belkys A, MD      . olopatadine (PATANOL) 0.1 % ophthalmic solution 1 drop  1 drop Both Eyes BID Rolly Salter, MD   1 drop at 03/30/19 0932  . ondansetron (ZOFRAN) tablet 4 mg  4 mg Oral Q6H PRN Rolly Salter, MD       Or  . ondansetron [facility]) injection 4 mg  4 mg Intravenous Q6H PRN Rolly Salter, MD      . polyvinyl alcohol (LIQUIFILM TEARS) 1.4 % ophthalmic solution 1 drop  1 drop Both Eyes Daily Rolly Salter, MD  1 drop at 03/30/19 0933  . predniSONE (DELTASONE) tablet 10 mg  10 mg Oral Q breakfast Rolly Salter, MD   10 mg at 03/30/19 0930  . rivaroxaban (XARELTO) tablet 20 mg  20 mg Oral Q supper Rolly Salter, MD      . sacubitril-valsartan [facility]) 49-51 mg per tablet  1 tablet Oral Q1200 Rolly Salter, MD   1 tablet at 03/30/19 0932  . sodium chloride flush (NS) 0.9 % injection 3 mL  3 mL Intravenous Q12H Rolly Salter, MD   3 mL at 03/30/19 0933  . traMADol (ULTRAM) tablet 25 mg  25 mg Oral Q8H PRN Regalado, Belkys A, MD         Discharge Medications: Please see discharge summary for a list of discharge medications.  Relevant Imaging Results:  Relevant Lab Results:   Additional Information SS#: [id number]  [name], LCSW

## 2019-03-30 NOTE — Evaluation (Signed)
Physical Therapy Evaluation Patient Details Name: [name] [name] MRN: [id number] DOB: 17-Mar-1930 Today's Date: 03/30/2019   History of Present Illness  [name] [name] is a 85 y.o. female with PMH of HTN; DM; CVA; seizures; CAD; and CHF. The patient is presenting with generalized weakness.and acute confusion.  Clinical Impression  Patient presents with significant mobility deficits. She requires max a +2 for bed mobility. She was unable to clear her hips off the bed despite total assist tx2. Prior to hospitalization and rehab she was living at home with aides during the day. At this time shoe would benefit most from rehab at a SNF. Acute PT will continue to work with the patient.     Follow Up Recommendations SNF    Equipment Recommendations  Rolling walker with 5" wheels    Recommendations for Other Services Rehab consult     Precautions / Restrictions Precautions Precautions: Fall Required Braces or Orthoses: Other Brace Other Brace: prevalon boot on R foot Restrictions Weight Bearing Restrictions: No      Mobility  Bed Mobility Overal bed mobility: Needs Assistance Bed Mobility: Supine to Sit;Sit to Supine     Supine to sit: Max assist;+2 for physical assistance;HOB elevated Sit to supine: Total assist   General bed mobility comments: Patient was able to participate in moving to the edge of bed but still required max a. She required total assist to transfer back to supine.   Transfers Overall transfer level: Needs assistance   Transfers: Sit to/from Stand Sit to Stand: Total assist;+2 physical assistance;+2 safety/equipment         General transfer comment: attmepted 2x but could not clear patients hips from the bed. Could not get pateints hips up even enough for equipment  Ambulation/Gait                Stairs            Wheelchair Mobility    Modified Rankin (Stroke Patients Only)       Balance Overall balance assessment: Needs  assistance Sitting-balance support: Single extremity supported;Feet supported Sitting balance-Leahy Scale: Poor Sitting balance - Comments: LUE support and min to mod A to steady Postural control: Posterior lean                                   Pertinent Vitals/Pain Pain Assessment: Faces Pain Score: 2  Faces Pain Scale: Hurts a little bit Pain Location: RLE with movement, grimacing with bed mobility Pain Descriptors / Indicators: Aching Pain Intervention(s): Limited activity within patient's tolerance    Home Living Family/patient expects to be discharged to:: Skilled nursing facility                      Prior Function Level of Independence: Needs assistance   Gait / Transfers Assistance Needed: From previous encounter in March: Pt states she had gotten to the place where she could take a few steps with therapy, but otherwise was at a transfers only level.  ADL's / Homemaking Assistance Needed: From previous encounter in March: Pt with longstanding dependence in bathing, dressing and toileting, could self feed and groom and participate in UB dressing        Hand Dominance   Dominant Hand: Right    Extremity/Trunk Assessment   Upper Extremity Assessment Upper Extremity Assessment: Defer to OT evaluation RUE Deficits / Details: limited AROM. assisted to position R hand  onto walker in seated position  RUE Coordination: decreased gross motor;decreased fine motor    Lower Extremity Assessment Lower Extremity Assessment: Generalized weakness       Communication   Communication: No difficulties  Cognition Arousal/Alertness: Awake/alert Behavior During Therapy: Flat affect Overall Cognitive Status: No family/caregiver present to determine baseline cognitive functioning                                 General Comments: minimal verbalizations. very flat affect      General Comments      Exercises     Assessment/Plan    PT  Assessment Patient needs continued PT services  PT Problem List Decreased strength;Decreased activity tolerance;Decreased range of motion;Decreased balance;Decreased mobility;Decreased knowledge of use of DME       PT Treatment Interventions DME instruction;Gait training;Stair training;Functional mobility training;Therapeutic activities;Therapeutic exercise    PT Goals (Current goals can be found in the Care Plan section)  Acute Rehab PT Goals Patient Stated Goal: get stronger PT Goal Formulation: With patient Time For Goal Achievement: 04/06/19 Potential to Achieve Goals: Good    Frequency Min 3X/week   Barriers to discharge        Co-evaluation PT/OT/SLP Co-Evaluation/Treatment: Yes Reason for Co-Treatment: Complexity of the patient's impairments (multi-system involvement);For patient/therapist safety;Necessary to address cognition/behavior during functional activity;To address functional/ADL transfers PT goals addressed during session: Balance;Mobility/safety with mobility;Proper use of DME;Strengthening/ROM OT goals addressed during session: ADL's and self-care       AM-PAC PT "6 Clicks" Mobility  Outcome Measure Help needed turning from your back to your side while in a flat bed without using bedrails?: Total Help needed moving from lying on your back to sitting on the side of a flat bed without using bedrails?: Total Help needed moving to and from a bed to a chair (including a wheelchair)?: Total Help needed standing up from a chair using your arms (e.g., wheelchair or bedside chair)?: Total Help needed to walk in hospital room?: Total   6 Click Score: 5    End of Session Equipment Utilized During Treatment: Gait belt Activity Tolerance: Patient limited by fatigue Patient left: in bed;with call bell/phone within reach;with nursing/sitter in room(nurse left cleaning the patient ) Nurse Communication: Mobility status PT Visit Diagnosis: Other abnormalities of gait and  mobility (R26.89);Muscle weakness (generalized) (M62.81);Difficulty in walking, not elsewhere classified (R26.2)    Time: 9326-7124 PT Time Calculation (min) (ACUTE ONLY): 22 min   Charges:   PT Evaluation $PT Eval Moderate Complexity: 1 Mod            Dessie Coma PT DPT   03/30/2019, 2:20 PM

## 2019-03-31 DIAGNOSIS — I825Z1 Chronic embolism and thrombosis of unspecified deep veins of right distal lower extremity: Secondary | ICD-10-CM

## 2019-03-31 DIAGNOSIS — G9341 Metabolic encephalopathy: Secondary | ICD-10-CM

## 2019-03-31 DIAGNOSIS — I5042 Chronic combined systolic (congestive) and diastolic (congestive) heart failure: Secondary | ICD-10-CM

## 2019-03-31 LAB — BASIC METABOLIC PANEL
Anion gap: 11 (ref 5–15)
BUN: 22 mg/dL (ref 8–23)
CO2: 24 mmol/L (ref 22–32)
Calcium: 8.6 mg/dL — ABNORMAL LOW (ref 8.9–10.3)
Chloride: 94 mmol/L — ABNORMAL LOW (ref 98–111)
Creatinine, Ser: 0.89 mg/dL (ref 0.44–1.00)
GFR calc Af Amer: >60 mL/min (ref >60)
GFR calc non Af Amer: 59 mL/min — ABNORMAL LOW (ref >60)
Glucose, Bld: 96 mg/dL (ref 70–99)
Potassium: 4.1 mmol/L (ref 3.5–5.1)
Sodium: 129 mmol/L — ABNORMAL LOW (ref 135–145)

## 2019-03-31 LAB — CBC
HCT: 22 % — ABNORMAL LOW (ref 36.0–46.0)
Hemoglobin: 7.6 g/dL — ABNORMAL LOW (ref 12.0–15.0)
MCH: 30.9 pg (ref 26.0–34.0)
MCHC: 34.5 g/dL (ref 30.0–36.0)
MCV: 89.4 fL (ref 80.0–100.0)
Platelets: 218 10*3/uL (ref 150–400)
RBC: 2.46 MIL/uL — ABNORMAL LOW (ref 3.87–5.11)
RDW: 18 % — ABNORMAL HIGH (ref 11.5–15.5)
WBC: 7.7 10*3/uL (ref 4.0–10.5)
nRBC: 0 % (ref 0.0–0.2)

## 2019-03-31 LAB — GLUCOSE, CAPILLARY
Glucose-Capillary: 87 mg/dL (ref 70–99)
Glucose-Capillary: 119 mg/dL — ABNORMAL HIGH (ref 70–99)
Glucose-Capillary: 131 mg/dL — ABNORMAL HIGH (ref 70–99)
Glucose-Capillary: 119 mg/dL — ABNORMAL HIGH (ref 70–99)

## 2019-03-31 LAB — SODIUM: Sodium: 129 mmol/L — ABNORMAL LOW (ref 135–145)

## 2019-03-31 LAB — SODIUM, URINE, RANDOM: Sodium, Ur: 24 mmol/L

## 2019-03-31 LAB — OSMOLALITY, URINE: Osmolality, Ur: 269 mOsm/kg — ABNORMAL LOW (ref 300–900)

## 2019-03-31 MED ORDER — FUROSEMIDE 20 MG PO TABS
20.0000 mg | ORAL_TABLET | Freq: Every day | ORAL | Status: DC
  Administered 2019-03-31 – 2019-04-03 (×4): 20 mg via ORAL
  Filled 2019-03-31 (×4): qty 1

## 2019-03-31 MED ORDER — GABAPENTIN 100 MG PO CAPS: 100.0000 mg | ORAL_CAPSULE | Freq: Three times a day (TID) | ORAL | 0 refills | Status: DC

## 2019-03-31 NOTE — Plan of Care (Signed)
Patient stable, discussed POC with patient, agreeable with plan, OOB to chair tolerated well, denies question/concerns at this time.

## 2019-03-31 NOTE — Progress Notes (Addendum)
PROGRESS NOTE    [name] [name]  MVE:[id number] DOB: Apr 06, 1930 DOA: 03/29/2019 PCP: Helane Rima, DO    Brief Narrative:  85 year old with past medical history significant for hypertension, diabetes, CVA, seizure, coronary artery disease and CHF who presented with generalized weakness altered mental status confusion probably related to polypharmacy and hyponatremia.  Patient was discharged as needed from her last hospitalization, she was eager to sleepy or in too much pain to be able to participate with physical therapist.  Patient recently discharged from a skilled nursing facility to home.  She was brought by family to the hospital with concern of confusion, generalized weakness, altered mental status. Evaluation in the ED patient was noted to have a sodium of 123 and noted to be lethargic.  Assessment & Plan:   Principal Problem:   Acute encephalopathy Active Problems:   Fibromyalgia   CKD stage 3 due to type 2 diabetes mellitus (HCC)   DM (diabetes mellitus), type 2 with renal complications (HCC)   Chronic combined systolic and diastolic CHF (congestive heart failure) (HCC)   Deep vein thrombosis (DVT) of distal vein of right lower extremity (HCC)   Hypertensive heart disease with heart failure (HCC)   Physical deconditioning   Chronic back pain   Rheumatoid arthritis involving both knees (HCC)   History of CVA (cerebrovascular accident)   Acute metabolic encephalopathy   Acute encephalopathy: probably sec to polypharmacy.  Resolved.  Adjusted her medications.    Hyponatremia:  Work up still pending.  Discussed with RN to get urine sodium and urine osmolality.  Improving slowly.    Physical deconditioning, chronic back pain and spinal stenosis:  - recommend outpatient follow up with orthopedic surgery.  - will need continued rehab on discharge.  - continue with lidocaine patch and tramadol for pain.  - weight bearing as tolerated.   Healing Stage 3 pressure  injury to right heel WOUND care consulted and recommendations given.   H/o of DVT: Resume xarelto.    H/o chronic combined CHF:  She appears compensated.    Type 2 dm:  CBG (last 3)  Recent Labs    03/30/19 2101 03/31/19 0612 03/31/19 1109  GLUCAP 100* 87 119*   Resume SSI.    CHRONIC RA:  Resume prednisone.    H/o of gout.    Anemia of chronic disease:  Transfuse to keep hemoglobin greater than 7.     DVT prophylaxis: (lovenox.  Code Status: full code.  Family Communication: none at bedside, will call daughter and update.  Disposition Plan:waiting for SNF placement, pt stable for discharge.    Consultants:   None.    Procedures: none.   Antimicrobials: none.   Subjective: No chest pain or sob.   Objective: Vitals:   03/31/19 0333 03/31/19 0731 03/31/19 1108 03/31/19 1512  BP: 131/64 (!) 145/76 112/60 137/75  Pulse: 84 84 79 81  Resp: 18 20 20 20   Temp: 98.2 F (36.8 C) 98.5 F (36.9 C) 98.2 F (36.8 C) (!) 97.5 F (36.4 C)  TempSrc: Oral Oral Oral Oral  SpO2: 97% 100% 98% 98%  Weight: 83 kg     Height:        Intake/Output Summary (Last 24 hours) at 03/31/2019 1514 Last data filed at 03/31/2019 1105 Gross per 24 hour  Intake 597 ml  Output 950 ml  Net -353 ml   Filed Weights   03/29/19 1954 03/30/19 0303 03/31/19 0333  Weight: 81.2 kg 81.3 kg 83 kg  Examination:  General exam: Appears calm and comfortable  Respiratory system: Clear to auscultation. Respiratory effort normal. Cardiovascular system: S1 & S2 heard, RRR. No JVD,  No pedal edema. Gastrointestinal system: Abdomen is nondistended, soft and nontender. No organomegaly or masses felt. Normal bowel sounds heard. Central nervous system: Alert and oriented to place and person.  Extremities: able to move both extremities.  Skin: Healing Stage 3 pressure injury to right heel Psychiatry:Mood & affect appropriate.     Data Reviewed: I have personally reviewed following  labs and imaging studies  CBC: Recent Labs  Lab 03/29/19 1400 03/29/19 1537 03/30/19 0358 03/31/19 0400  WBC 9.1  --  8.5 7.7  NEUTROABS 7.8*  --   --   --   HGB 8.5* 8.8* 7.9* 7.6*  HCT 25.5* 26.0* 23.3* 22.0*  MCV 92.4  --  90.3 89.4  PLT 231  --  199 218   Basic Metabolic Panel: Recent Labs  Lab 03/29/19 1400 03/29/19 1537 03/29/19 1717 03/29/19 2129 03/30/19 0358 03/30/19 0956 03/31/19 0400 03/31/19 1137  NA 123* 122* 123* 124* 126* 126* 129* 129*  K 4.9 4.8 4.8  --  4.5 4.1 4.1  --   CL 92*  --  90*  --  95* 92* 94*  --   CO2 22  --  23  --  24 25 24   --   GLUCOSE 122*  --  111*  --  88 101* 96  --   BUN 16  --  16  --  18 18 22   --   CREATININE 0.90  --  0.81  --  0.82 0.84 0.89  --   CALCIUM 8.3*  --  8.6*  --  8.3* 8.5* 8.6*  --    GFR: Estimated Creatinine Clearance: 47.3 mL/min (by C-G formula based on SCr of 0.89 mg/dL). Liver Function Tests: No results for input(s): AST, ALT, ALKPHOS, BILITOT, PROT, ALBUMIN in the last 168 hours. No results for input(s): LIPASE, AMYLASE in the last 168 hours. Recent Labs  Lab 03/29/19 1717  AMMONIA 18   Coagulation Profile: No results for input(s): INR, PROTIME in the last 168 hours. Cardiac Enzymes: Recent Labs  Lab 03/29/19 1540  TROPONINI 0.05*   BNP (last 3 results) No results for input(s): PROBNP in the last 8760 hours. HbA1C: No results for input(s): HGBA1C in the last 72 hours. CBG: Recent Labs  Lab 03/30/19 1137 03/30/19 1623 03/30/19 2101 03/31/19 0612 03/31/19 1109  GLUCAP 95 134* 100* 87 119*   Lipid Profile: No results for input(s): CHOL, HDL, LDLCALC, TRIG, CHOLHDL, LDLDIRECT in the last 72 hours. Thyroid Function Tests: Recent Labs    03/29/19 1717  TSH 1.521   Anemia Panel: Recent Labs    03/29/19 1717  VITAMINB12 531   Sepsis Labs: No results for input(s): PROCALCITON, LATICACIDVEN in the last 168 hours.  Recent Results (from the past 240 hour(s))  Urine culture      Status: None   Collection Time: 03/29/19  2:58 PM  Result Value Ref Range Status   Specimen Description URINE, CATHETERIZED  Final   Special Requests NONE  Final   Culture   Final    NO GROWTH Performed at [facility], [address] [place], [place] [address]    Report Status 03/30/2019 FINAL  Final  SARS Coronavirus 2 (CEPHEID - Performed in [facility] lab), Hosp Order     Status: None   Collection Time: 03/29/19  5:11 PM  Result Value Ref Range Status   SARS Coronavirus 2 NEGATIVE NEGATIVE Final    Comment: (NOTE) If result is NEGATIVE SARS-CoV-2 target nucleic acids are NOT DETECTED. The SARS-CoV-2 RNA is generally detectable in upper and lower  respiratory specimens during the acute phase of infection. The lowest  concentration of SARS-CoV-2 viral copies this assay can detect is 250  copies / mL. A negative result does not preclude SARS-CoV-2 infection  and should not be used as the sole basis for treatment or other  patient management decisions.  A negative result may occur with  improper specimen collection / handling, submission of specimen other  than nasopharyngeal swab, presence of viral mutation(s) within the  areas targeted by this assay, and inadequate number of viral copies  (<250 copies / mL). A negative result must be combined with clinical  observations, patient history, and epidemiological information. If result is POSITIVE SARS-CoV-2 target nucleic acids are DETECTED. The SARS-CoV-2 RNA is generally detectable in upper and lower  respiratory specimens dur ing the acute phase of infection.  Positive  results are indicative of active infection with SARS-CoV-2.  Clinical  correlation with patient history and other diagnostic information is  necessary to determine patient infection status.  Positive results do  not rule out bacterial infection or co-infection with other viruses. If result is PRESUMPTIVE POSTIVE SARS-CoV-2 nucleic acids MAY BE  PRESENT.   A presumptive positive result was obtained on the submitted specimen  and confirmed on repeat testing.  While 2019 novel coronavirus  (SARS-CoV-2) nucleic acids may be present in the submitted sample  additional confirmatory testing may be necessary for epidemiological  and / or clinical management purposes  to differentiate between  SARS-CoV-2 and other Sarbecovirus currently known to infect humans.  If clinically indicated additional testing with an alternate test  methodology [phone]) is advised. The SARS-CoV-2 RNA is generally  detectable in upper and lower respiratory sp ecimens during the acute  phase of infection. The expected result is Negative. Fact Sheet for Patients:  BoilerBrush.com.cy Fact Sheet for Healthcare Providers: https://pope.com/ This test is not yet approved or cleared by the [place] FDA and has been authorized for detection and/or diagnosis of SARS-CoV-2 by FDA under an Emergency Use Authorization (EUA).  This EUA will remain in effect (meaning this test can be used) for the duration of the COVID-19 declaration under Section 564(b)(1) of the Act, 21 U.S.C. section 360bbb-3(b)(1), unless the authorization is terminated or revoked sooner. Performed at [facility], [address] [place], [place] [address]          Radiology Studies: Ct Head Wo Contrast  Result Date: 03/29/2019 CLINICAL DATA:  Weakness EXAM: CT HEAD WITHOUT CONTRAST TECHNIQUE: Contiguous axial images were obtained from the base of the skull through the vertex without intravenous contrast. COMPARISON:  Head CT 01/09/2019 FINDINGS: Brain: There is no mass, hemorrhage or extra-axial collection. The size and configuration of the ventricles and extra-axial CSF spaces are normal. There is hypoattenuation of the white matter, most commonly indicating chronic small vessel disease. Vascular: No abnormal hyperdensity of the major  intracranial arteries or dural venous sinuses. No intracranial atherosclerosis. Skull: The visualized skull base, calvarium and extracranial soft tissues are normal. Sinuses/Orbits: No fluid levels or advanced mucosal thickening of the visualized paranasal sinuses. No mastoid or middle ear effusion. The orbits are normal. IMPRESSION: Chronic small vessel ischemia without acute intracranial abnormality. Electronically Signed   By: Deatra Robinson [name]   On: 03/29/2019 15:56  Dg Chest Portable 1 View  Result Date: 03/29/2019 CLINICAL DATA:  Increased somnolence EXAM: PORTABLE CHEST 1 VIEW COMPARISON:  01/09/2019 FINDINGS: Cardiac shadow remains enlarged. Tortuous aorta with calcifications is again noted. No focal infiltrate or sizable effusion is seen. No significant vascular congestion is noted. No bony abnormality is noted. IMPRESSION: No acute abnormality noted. Electronically Signed   By: Alcide Clever [name]   On: 03/29/2019 15:26        Scheduled Meds: . acetaminophen  500 mg Oral TID  . allopurinol  100 mg Oral QHS  . carvedilol  25 mg Oral BID WC  . feeding supplement (ENSURE ENLIVE)  237 mL Oral TID BM  . ferrous sulfate  325 mg Oral QODAY  . furosemide  20 mg Oral Daily  . gabapentin  100 mg Oral TID  . hydrALAZINE  100 mg Oral TID  . isosorbide mononitrate  30 mg Oral QHS  . leflunomide  20 mg Oral Daily  . lidocaine  1 patch Transdermal Q24H  . linagliptin  5 mg Oral Daily  . olopatadine  1 drop Both Eyes BID  . polyvinyl alcohol  1 drop Both Eyes Daily  . predniSONE  10 mg Oral Q breakfast  . rivaroxaban  20 mg Oral Q supper  . sacubitril-valsartan  1 tablet Oral Q1200  . sodium chloride flush  3 mL Intravenous Q12H   Continuous Infusions:   LOS: 1 day    Time spent: 25 minutes.     Kathlen Mody, MD [facility] Pager [phone]  If 7PM-7AM, please contact night-coverage [email or web address] Password [email or web address] 03/31/2019, 3:14 PM

## 2019-03-31 NOTE — Discharge Instructions (Signed)
Information on my medicine - XARELTO (rivaroxaban)  This medication education was reviewed with me or my healthcare representative as part of my discharge preparation.  WHY WAS XARELTO PRESCRIBED FOR YOU? Xarelto was prescribed to treat blood clots that may have been found in the veins of your legs (deep vein thrombosis) or in your lungs (pulmonary embolism) and to reduce the risk of them occurring again.  What do you need to know about Xarelto? The dose is 20 mg tablet taken ONCE A DAY with your evening meal.  DO NOT stop taking Xarelto without talking to the health care provider who prescribed the medication.  Refill your prescription for 20 mg tablets before you run out.  After discharge, you should have regular check-up appointments with your healthcare provider that is prescribing your Xarelto.  In the future your dose may need to be changed if your kidney function changes by a significant amount.  What do you do if you miss a dose? If you are taking Xarelto TWICE DAILY and you miss a dose, take it as soon as you remember. You may take two 15 mg tablets (total 30 mg) at the same time then resume your regularly scheduled 15 mg twice daily the next day.  If you are taking Xarelto ONCE DAILY and you miss a dose, take it as soon as you remember on the same day then continue your regularly scheduled once daily regimen the next day. Do not take two doses of Xarelto at the same time.   Important Safety Information Xarelto is a blood thinner medicine that can cause bleeding. You should call your healthcare provider right away if you experience any of the following: ? Bleeding from an injury or your nose that does not stop. ? Unusual colored urine (red or dark brown) or unusual colored stools (red or black). ? Unusual bruising for unknown reasons. ? A serious fall or if you hit your head (even if there is no bleeding).  Some medicines may interact with Xarelto and might increase your  risk of bleeding while on Xarelto. To help avoid this, consult your healthcare provider or pharmacist prior to using any new prescription or non-prescription medications, including herbals, vitamins, non-steroidal anti-inflammatory drugs (NSAIDs) and supplements.  This website has more information on Xarelto: www.xarelto.com.  

## 2019-03-31 NOTE — Progress Notes (Signed)
Physical Therapy Treatment Patient Details Name: [name] [name] MRN: [id number] DOB: 1930-06-27 Today's Date: 03/31/2019    History of Present Illness [name] [name] is a 85 y.o. female with PMH of HTN; DM; CVA; seizures; CAD; and CHF. The patient is presenting with generalized weakness.and acute confusion.    PT Comments    Patient seen for mobility progression. Pt requires total A +2 for lateral scoot transfer to drop arm recliner. Pt reports she transferred with hoyer lift at SNF prior to this admission. Current plan remains appropriate.    Follow Up Recommendations  SNF     Equipment Recommendations  Other (comment)(TBD next venue)    Recommendations for Other Services       Precautions / Restrictions Precautions Precautions: Fall Required Braces or Orthoses: Other Brace Other Brace: prevalon boot on R foot Restrictions Weight Bearing Restrictions: No    Mobility  Bed Mobility Overal bed mobility: Needs Assistance Bed Mobility: Supine to Sit     Supine to sit: Max assist;+2 for physical assistance;HOB elevated     General bed mobility comments: pt assisted minimally to bring bilat LE to EOB and reaching for rail on L side; bed pad utilized   Transfers Overall transfer level: Needs assistance   Transfers: Lateral/Scoot Transfers          Lateral/Scoot Transfers: Total assist;+2 physical assistance General transfer comment: mulitmodal cues for positioning throughout including anterior translation of trunk and hand/foot placement  Ambulation/Gait                 Stairs             Wheelchair Mobility    Modified Rankin (Stroke Patients Only)       Balance Overall balance assessment: Needs assistance Sitting-balance support: Single extremity supported;Feet supported Sitting balance-Leahy Scale: Fair                                      Cognition Arousal/Alertness: Awake/alert Behavior During Therapy: Flat  affect Overall Cognitive Status: No family/caregiver present to determine baseline cognitive functioning                                        Exercises Other Exercises Other Exercises: LAQ x 10 bilat seated    General Comments        Pertinent Vitals/Pain Pain Assessment: Faces Faces Pain Scale: Hurts a little bit Pain Location: back Pain Descriptors / Indicators: Discomfort Pain Intervention(s): Monitored during session;Repositioned;RN gave pain meds during session    Home Living                      Prior Function            PT Goals (current goals can now be found in the care plan section) Progress towards PT goals: Progressing toward goals    Frequency    Min 3X/week      PT Plan Current plan remains appropriate    Co-evaluation              AM-PAC PT "6 Clicks" Mobility   Outcome Measure  Help needed turning from your back to your side while in a flat bed without using bedrails?: Total Help needed moving from lying on your back to sitting on the side of a  flat bed without using bedrails?: Total Help needed moving to and from a bed to a chair (including a wheelchair)?: Total Help needed standing up from a chair using your arms (e.g., wheelchair or bedside chair)?: Total Help needed to walk in hospital room?: Total Help needed climbing 3-5 steps with a railing? : Total 6 Click Score: 6    End of Session Equipment Utilized During Treatment: Gait belt Activity Tolerance: Patient limited by fatigue Patient left: with call bell/phone within reach;in chair;with chair alarm set Nurse Communication: Mobility status;Need for lift equipment PT Visit Diagnosis: Other abnormalities of gait and mobility (R26.89);Muscle weakness (generalized) (M62.81);Difficulty in walking, not elsewhere classified (R26.2)     Time: 8850-2774 PT Time Calculation (min) (ACUTE ONLY): 32 min  Charges:  $Therapeutic Activity: 23-37 mins                      Erline Levine, PTA Acute Rehabilitation Services Pager: [phone] Office: [phone]     Carolynne Edouard 03/31/2019, 10:23 AM

## 2019-03-31 NOTE — Consult Note (Signed)
WOC Nurse wound consult note Reason for Consult: Healing Stage 3 pressure injury to right heel Wound type:Pressure Pressure Injury POA: Yes Measurement: 2cm x 2.5cm x 0.1cm with evidence of healing in the periwound area Wound bed: Pink, moist Drainage (amount, consistency, odor) scant serous Periwound:intact with evidence of previous healing. Dry skin on feet Dressing procedure/placement/frequency: I will provide Nursing with guidance for daily wound care with xeroform gauze. A Prevalon Boot has already been provided for pressure redistribution. A sacral prophylactic foam dressing is ordered to prevent pressure injury to sacrum.  WOC nursing team will not follow, but will remain available to this patient, the nursing and medical teams.  Please re-consult if needed.  Thanks, Ladona Mow, MSN, RN, GNP, [place]  Pager# [phone] Conservative sharp wound debridement (CSWD performed at the bedside):

## 2019-04-01 ENCOUNTER — Encounter (HOSPITAL_COMMUNITY): Payer: Self-pay

## 2019-04-01 LAB — GLUCOSE, CAPILLARY
Glucose-Capillary: 89 mg/dL (ref 70–99)
Glucose-Capillary: 104 mg/dL — ABNORMAL HIGH (ref 70–99)
Glucose-Capillary: 129 mg/dL — ABNORMAL HIGH (ref 70–99)
Glucose-Capillary: 107 mg/dL — ABNORMAL HIGH (ref 70–99)

## 2019-04-01 LAB — BASIC METABOLIC PANEL
Anion gap: 9 (ref 5–15)
BUN: 20 mg/dL (ref 8–23)
CO2: 24 mmol/L (ref 22–32)
Calcium: 8.5 mg/dL — ABNORMAL LOW (ref 8.9–10.3)
Chloride: 96 mmol/L — ABNORMAL LOW (ref 98–111)
Creatinine, Ser: 0.82 mg/dL (ref 0.44–1.00)
GFR calc Af Amer: >60 mL/min (ref >60)
GFR calc non Af Amer: >60 mL/min (ref >60)
Glucose, Bld: 114 mg/dL — ABNORMAL HIGH (ref 70–99)
Potassium: 4.1 mmol/L (ref 3.5–5.1)
Sodium: 129 mmol/L — ABNORMAL LOW (ref 135–145)

## 2019-04-01 MED ORDER — PHENOL 1.4 % MT LIQD
1.0000 | OROMUCOSAL | Status: DC | PRN
  Administered 2019-04-01: 1 via OROMUCOSAL
  Filled 2019-04-01: qty 177

## 2019-04-01 NOTE — TOC Progression Note (Signed)
Transition of Care [facility]) - Progression Note    Patient Details  Name: [name] [name] MRN: [id number] Date of Birth: 1930-03-08  [facility]) CM/SW Contact  Nada Boozer [name], LCSWA Phone Number: 04/01/2019, 2:11 PM  Clinical Narrative:     CSW called the patient's daughter to inform her that [facility] had a bed available for her mother today. She stated that she would like to go out and drive to see it. CSW received a phone call that the patient's daughter was at the facility but no one would let her in. CSW explained that at this time facilities are closed to outside visitors. [name] stated that at this time she did not feel comfortable putting her mother into a facility that she could not walk around and visit.   After speaking with the daughter, she would like to bring her mother home with home health. She believes that she can provide her mother care at home with home health and the continuation of personal CNA's that assist the patient.   She would like to continue with [facility]. CSW has notified RNCM.    Expected Discharge Plan: Home w Home Health Services Barriers to Discharge: Continued Medical Work up  Expected Discharge Plan and Services Expected Discharge Plan: Home w Home Health Services       Living arrangements for the past 2 months: Skilled Nursing Facility([facility])                                       Social Determinants of Health (SDOH) Interventions    Readmission Risk Interventions No flowsheet data found.

## 2019-04-01 NOTE — Progress Notes (Signed)
PROGRESS NOTE    [name] CluckHazel C [name]  WUJ:[id number] DOB: 09/06/1930 DOA: 03/29/2019 PCP: Helane RimaWallace, Erica, DO    Brief Narrative:  85 year old with past medical history significant for hypertension, diabetes, CVA, seizure, coronary artery disease and CHF who presented with generalized weakness altered mental status confusion probably related to polypharmacy and hyponatremia.  Patient was discharged  from her last hospitalization, she was too sleepy or in too much pain to be able to participate with physical therapist.  Patient recently discharged from a skilled nursing facility to home.  She was brought by family to the hospital with concern of confusion, generalized weakness, altered mental status. Evaluation in the ED patient was noted to have a sodium of 123 and noted to be lethargic.  Assessment & Plan:   Principal Problem:   Acute encephalopathy Active Problems:   Fibromyalgia   CKD stage 3 due to type 2 diabetes mellitus (HCC)   DM (diabetes mellitus), type 2 with renal complications (HCC)   Chronic combined systolic and diastolic CHF (congestive heart failure) (HCC)   Deep vein thrombosis (DVT) of distal vein of right lower extremity (HCC)   Hypertensive heart disease with heart failure (HCC)   Physical deconditioning   Chronic back pain   Rheumatoid arthritis involving both knees (HCC)   History of CVA (cerebrovascular accident)   Acute metabolic encephalopathy   Acute encephalopathy: probably sec to polypharmacy.  Resolved.  Adjusted her medications.    Hyponatremia:  Work up still pending.  Discussed with RN to get urine sodium and urine osmolality.  Improving slowly. Sodium still at 129. Urine sodium of 24.  Urine osmolality is 269, serum osmolality 256. tsh 1.521.   Physical deconditioning, chronic back pain and spinal stenosis:  - recommend outpatient follow up with orthopedic surgery.  - will need continued rehab on discharge.  - continue with lidocaine patch  and tramadol for pain.  - weight bearing as tolerated.   Healing Stage 3 pressure injury to right heel WOUND care consulted and recommendations given.   H/o of DVT: Resume xarelto.    H/o chronic combined CHF:  She appears compensated.    Type 2 dm:  CBG (last 3)  Recent Labs    04/01/19 0620 04/01/19 1117 04/01/19 1610  GLUCAP 89 104* 129*   Resume SSI.    CHRONIC RA:  Resume prednisone.    H/o of gout.    Anemia of chronic disease:  Transfuse to keep hemoglobin greater than 7.     DVT prophylaxis: (lovenox.  Code Status: full code.  Family Communication: none at bedside, will call daughter and update.  Disposition Plan:waiting for SNF placement, pt stable for discharge.    Consultants:   None.    Procedures: none.   Antimicrobials: none.   Subjective: No chest pain or sob. No new complaints.    Objective: Vitals:   04/01/19 0400 04/01/19 0725 04/01/19 1119 04/01/19 1612  BP: (!) 145/77 (!) 151/72 139/62 129/61  Pulse: 87 83 79 83  Resp: 18 17 16 17   Temp: 98 F (36.7 C) 98.3 F (36.8 C) 98.2 F (36.8 C) 98.4 F (36.9 C)  TempSrc: Oral Oral Oral Oral  SpO2: 99% 98% 98% 96%  Weight:      Height:        Intake/Output Summary (Last 24 hours) at 04/01/2019 1831 Last data filed at 04/01/2019 1600 Gross per 24 hour  Intake 240 ml  Output 1950 ml  Net -1710 ml   [employer] Weights  03/29/19 1954 03/30/19 0303 03/31/19 0333  Weight: 81.2 kg 81.3 kg 83 kg    Examination:  General exam: Appears calm and comfortable  Respiratory system: Clear to auscultation. Respiratory effort normal. Cardiovascular system: S1 & S2 heard, RRR. No JVD,  No pedal edema. Gastrointestinal system: Abdomen is nondistended, soft and nontender. No organomegaly or masses felt. Normal bowel sounds heard. Central nervous system: Alert and oriented to place and person.  Extremities: able to move both extremities.  Skin: Healing Stage 3 pressure injury to right  heel Psychiatry:Mood & affect appropriate.     Data Reviewed: I have personally reviewed following labs and imaging studies  CBC: Recent Labs  Lab 03/29/19 1400 03/29/19 1537 03/30/19 0358 03/31/19 0400  WBC 9.1  --  8.5 7.7  NEUTROABS 7.8*  --   --   --   HGB 8.5* 8.8* 7.9* 7.6*  HCT 25.5* 26.0* 23.3* 22.0*  MCV 92.4  --  90.3 89.4  PLT 231  --  199 218   Basic Metabolic Panel: Recent Labs  Lab 03/29/19 1717  03/30/19 0358 03/30/19 0956 03/31/19 0400 03/31/19 1137 04/01/19 1306  NA 123*   < > 126* 126* 129* 129* 129*  K 4.8  --  4.5 4.1 4.1  --  4.1  CL 90*  --  95* 92* 94*  --  96*  CO2 23  --  24 25 24   --  24  GLUCOSE 111*  --  88 101* 96  --  114*  BUN 16  --  18 18 22   --  20  CREATININE 0.81  --  0.82 0.84 0.89  --  0.82  CALCIUM 8.6*  --  8.3* 8.5* 8.6*  --  8.5*   < > = values in this interval not displayed.   GFR: Estimated Creatinine Clearance: 51.3 mL/min (by C-G formula based on SCr of 0.82 mg/dL). Liver Function Tests: No results for input(s): AST, ALT, ALKPHOS, BILITOT, PROT, ALBUMIN in the last 168 hours. No results for input(s): LIPASE, AMYLASE in the last 168 hours. Recent Labs  Lab 03/29/19 1717  AMMONIA 18   Coagulation Profile: No results for input(s): INR, PROTIME in the last 168 hours. Cardiac Enzymes: Recent Labs  Lab 03/29/19 1540  TROPONINI 0.05*   BNP (last 3 results) No results for input(s): PROBNP in the last 8760 hours. HbA1C: No results for input(s): HGBA1C in the last 72 hours. CBG: Recent Labs  Lab 03/31/19 1612 03/31/19 2121 04/01/19 0620 04/01/19 1117 04/01/19 1610  GLUCAP 131* 119* 89 104* 129*   Lipid Profile: No results for input(s): CHOL, HDL, LDLCALC, TRIG, CHOLHDL, LDLDIRECT in the last 72 hours. Thyroid Function Tests: No results for input(s): TSH, T4TOTAL, FREET4, T3FREE, THYROIDAB in the last 72 hours. Anemia Panel: No results for input(s): VITAMINB12, FOLATE, FERRITIN, TIBC, IRON, RETICCTPCT in  the last 72 hours. Sepsis Labs: No results for input(s): PROCALCITON, LATICACIDVEN in the last 168 hours.  Recent Results (from the past 240 hour(s))  Urine culture     Status: None   Collection Time: 03/29/19  2:58 PM  Result Value Ref Range Status   Specimen Description URINE, CATHETERIZED  Final   Special Requests NONE  Final   Culture   Final    NO GROWTH Performed at [facility], [address], [place], [place] [address]    Report Status 03/30/2019 FINAL  Final  SARS Coronavirus 2 (CEPHEID - Performed in [facility] hospital lab), [facility]  Status: None   Collection Time: 03/29/19  5:11 PM  Result Value Ref Range Status   SARS Coronavirus 2 NEGATIVE NEGATIVE Final    Comment: (NOTE) If result is NEGATIVE SARS-CoV-2 target nucleic acids are NOT DETECTED. The SARS-CoV-2 RNA is generally detectable in upper and lower  respiratory specimens during the acute phase of infection. The lowest  concentration of SARS-CoV-2 viral copies this assay can detect is 250  copies / mL. A negative result does not preclude SARS-CoV-2 infection  and should not be used as the sole basis for treatment or other  patient management decisions.  A negative result may occur with  improper specimen collection / handling, submission of specimen other  than nasopharyngeal swab, presence of viral mutation(s) within the  areas targeted by this assay, and inadequate number of viral copies  (<250 copies / mL). A negative result must be combined with clinical  observations, patient history, and epidemiological information. If result is POSITIVE SARS-CoV-2 target nucleic acids are DETECTED. The SARS-CoV-2 RNA is generally detectable in upper and lower  respiratory specimens dur ing the acute phase of infection.  Positive  results are indicative of active infection with SARS-CoV-2.  Clinical  correlation with patient history and other diagnostic information is  necessary to determine patient  infection status.  Positive results do  not rule out bacterial infection or co-infection with other viruses. If result is PRESUMPTIVE POSTIVE SARS-CoV-2 nucleic acids MAY BE PRESENT.   A presumptive positive result was obtained on the submitted specimen  and confirmed on repeat testing.  While 2019 novel coronavirus  (SARS-CoV-2) nucleic acids may be present in the submitted sample  additional confirmatory testing may be necessary for epidemiological  and / or clinical management purposes  to differentiate between  SARS-CoV-2 and other Sarbecovirus currently known to infect humans.  If clinically indicated additional testing with an alternate test  methodology [id number]) is advised. The SARS-CoV-2 RNA is generally  detectable in upper and lower respiratory sp ecimens during the acute  phase of infection. The expected result is Negative. Fact Sheet for Patients:  BoilerBrush.com.cy Fact Sheet for Healthcare Providers: https://pope.com/ This test is not yet approved or cleared by the [place] FDA and has been authorized for detection and/or diagnosis of SARS-CoV-2 by FDA under an Emergency Use Authorization (EUA).  This EUA will remain in effect (meaning this test can be used) for the duration of the COVID-19 declaration under Section 564(b)(1) of the Act, 21 U.S.C. section 360bbb-3(b)(1), unless the authorization is terminated or revoked sooner. Performed at [facility], [address] [place], [place] [address]          Radiology Studies: No results found.      Scheduled Meds: . acetaminophen  500 mg Oral TID  . allopurinol  100 mg Oral QHS  . carvedilol  25 mg Oral BID WC  . feeding supplement (ENSURE ENLIVE)  237 mL Oral TID BM  . ferrous sulfate  325 mg Oral QODAY  . furosemide  20 mg Oral Daily  . gabapentin  100 mg Oral TID  . hydrALAZINE  100 mg Oral TID  . isosorbide mononitrate  30 mg Oral QHS  .  leflunomide  20 mg Oral Daily  . lidocaine  1 patch Transdermal Q24H  . linagliptin  5 mg Oral Daily  . olopatadine  1 drop Both Eyes BID  . polyvinyl alcohol  1 drop Both Eyes Daily  . predniSONE  10 mg Oral Q breakfast  .  rivaroxaban  20 mg Oral Q supper  . sacubitril-valsartan  1 tablet Oral Q1200  . sodium chloride flush  3 mL Intravenous Q12H   Continuous Infusions:   LOS: 2 days    Time spent: 25 minutes.     Kathlen Mody, MD [facility] Pager [phone]  If 7PM-7AM, please contact night-coverage [email or web address] Password [facility] 04/01/2019, 6:31 PM

## 2019-04-01 NOTE — Care Management (Addendum)
15:30 Spoke w [name] [name] of [facility]. She states that the earliest start of care for home services through them would be Wednesday (5/27). Patient would need HH orders with face to face. Spoke w patient's [name]. She states that she hasn't see her mother in 3 months. Between hospital stays and the stay at SNF she hasn't been able to see her face to face. She states that her mom used to live in an apartment by herself, 5 minutes from her with PCS 4 hours a day and a lot of family involvement in her care. She has a hospital bed, 3/1, shower set up, RW and WC at her apartment. She had [facility] for PT up until 11/15/2018, with her certification ending 3/1.  [name] [name] feels uncomfortable sending her back to SNF at this time because she can't check them out and still won't be able to visit her there. She states her mother is out of Medicare days.  [name] [name] states that she has checked in with [facility] in an attempt to facilitate her discharge. They would not be able to start services until they screen her on Tuesday.  [name] [name] is concerned with the safety of sending her mother back to her apartment. We discussed her taking her back to her house. She states she is agreeable to have her mother stay with her as her husband would be able to provide supervision. [name] [name] expressed concern about the about the amount of assistance her mother would need, as nursing staff is telling her that it takes 2+ assist to turn her and [name] [name] would not be able to do so as she herself has a back injury that required steroid injections this week.  They were having a family meeting tonight to see how they pull together resources to be able to safely and adequately support the patient at home.  CM will contact her tomorrow to further discuss DC plan.    [name],[name] Daughter   [phone]

## 2019-04-01 NOTE — TOC Progression Note (Deleted)
Transition of Care [facility]) - Progression Note    Patient Details  Name: [name] [name] MRN: [id number] Date of Birth: 08-12-1930  Transition [facility] Care [facility]) CM/SW Contact  Nada Boozer Ranulfo Kall, LCSWA Phone Number: 04/01/2019, 1:09 PM  Clinical Narrative:      Update : After speaking with the daughter, she would like to bring her mother home with home health. She does not feel comfortable putting her mother into a facility that she cannot go tour and visit personally. She would like to continue with [facility]. CSW has notified RNCM.    Expected Discharge Plan: Skilled Nursing Facility Barriers to Discharge: Continued Medical Work up  Expected Discharge Plan and Services Expected Discharge Plan: Skilled Nursing Facility       Living arrangements for the past 2 months: Skilled Nursing Facility([facility])                                       Social Determinants of Health (SDOH) Interventions    Readmission Risk Interventions No flowsheet data found.

## 2019-04-02 LAB — BASIC METABOLIC PANEL
Anion gap: 10 (ref 5–15)
BUN: 20 mg/dL (ref 8–23)
CO2: 23 mmol/L (ref 22–32)
Calcium: 8.9 mg/dL (ref 8.9–10.3)
Chloride: 95 mmol/L — ABNORMAL LOW (ref 98–111)
Creatinine, Ser: 0.81 mg/dL (ref 0.44–1.00)
GFR calc Af Amer: >60 mL/min (ref >60)
GFR calc non Af Amer: >60 mL/min (ref >60)
Glucose, Bld: 123 mg/dL — ABNORMAL HIGH (ref 70–99)
Potassium: 4.1 mmol/L (ref 3.5–5.1)
Sodium: 128 mmol/L — ABNORMAL LOW (ref 135–145)

## 2019-04-02 LAB — GLUCOSE, CAPILLARY
Glucose-Capillary: 110 mg/dL — ABNORMAL HIGH (ref 70–99)
Glucose-Capillary: 101 mg/dL — ABNORMAL HIGH (ref 70–99)
Glucose-Capillary: 130 mg/dL — ABNORMAL HIGH (ref 70–99)
Glucose-Capillary: 145 mg/dL — ABNORMAL HIGH (ref 70–99)

## 2019-04-02 NOTE — Care Management (Signed)
Spoke w patient daughter Lattie Corns today (see note from yesterday).  I followed up with her about her mother's sodium and that we will check it tomorrow to see if she is ready for discharge. She stated that they have decided as a family to bring her back to Jeanette's house at DC, and she lining up 24/7 help.

## 2019-04-02 NOTE — Progress Notes (Signed)
PROGRESS NOTE    Roel CluckHazel C Salzman  ZOX:[id number] DOB: [birth year] DOA: 03/29/2019 PCP: Helane RimaWallace, Erica, DO    Brief Narrative:  85 year old with past medical history significant for hypertension, diabetes, CVA, seizure, coronary artery disease and CHF who presented with generalized weakness altered mental status confusion probably related to polypharmacy and hyponatremia.  Patient was discharged  from her last hospitalization, she was too sleepy or in too much pain to be able to participate with physical therapist.  Patient recently discharged from a skilled nursing facility to home.  She was brought by family to the hospital with concern of confusion, generalized weakness, altered mental status. Evaluation in the ED patient was noted to have a sodium of 123 and noted to be lethargic.  Assessment & Plan:   Principal Problem:   Acute encephalopathy Active Problems:   Fibromyalgia   CKD stage 3 due to type 2 diabetes mellitus (HCC)   DM (diabetes mellitus), type 2 with renal complications (HCC)   Chronic combined systolic and diastolic CHF (congestive heart failure) (HCC)   Deep vein thrombosis (DVT) of distal vein of right lower extremity (HCC)   Hypertensive heart disease with heart failure (HCC)   Physical deconditioning   Chronic back pain   Rheumatoid arthritis involving both knees (HCC)   History of CVA (cerebrovascular accident)   Acute metabolic encephalopathy   Acute encephalopathy: probably sec to polypharmacy.  Resolved.  Adjusted her medications.    Hyponatremia:  Stable around 129. She is alert and oriented to place and person.   Sodium still at 129. Urine sodium of 24.  Urine osmolality is 269, serum osmolality 256. tsh 1.521.   Physical deconditioning, chronic back pain and spinal stenosis:  - recommend outpatient follow up with orthopedic surgery.  - will need continued rehab on discharge.  - continue with lidocaine patch and tramadol for pain.  - weight  bearing as tolerated.   Healing Stage 3 pressure injury to right heel wound care consulted and recommendations given.   H/o of DVT: Resume xarelto.    H/o chronic combined CHF:  She appears compensated.    Type 2 dm:  CBG (last 3)  Recent Labs    04/01/19 2120 04/02/19 0603 04/02/19 1242  GLUCAP 107* 110* 101*   Resume SSI.    CHRONIC RA:  Resume prednisone.    H/o of gout.    Anemia of chronic disease:  Transfuse to keep hemoglobin greater than 7.     DVT prophylaxis: (lovenox.  Code Status: full code.  Family Communication: none at bedside, called daughter to update.  Disposition Plan:waiting for SNF placement, not safe for discharge home by herself.    Consultants:   Wound care consult.    Procedures: none.   Antimicrobials: none.   Subjective: No chest pain or sob. No new complaints.  No nausea or vomiting.  Persistent back pain.   Objective: Vitals:   04/01/19 2336 04/02/19 0309 04/02/19 0719 04/02/19 1243  BP: 130/61 (!) 143/70 (!) 144/69 137/73  Pulse: 81 87 93 84  Resp: 18 18 18 18   Temp: 99 F (37.2 C) 98.8 F (37.1 C) 98.2 F (36.8 C) 98 F (36.7 C)  TempSrc: Oral Oral Oral Oral  SpO2: 99% 98% 96% 98%  Weight:  80.7 kg    Height:        Intake/Output Summary (Last 24 hours) at 04/02/2019 1520 Last data filed at 04/02/2019 1456 Gross per 24 hour  Intake 240 ml  Output 2050 ml  Net -1810 ml   Filed Weights   03/30/19 0303 03/31/19 0333 04/02/19 0309  Weight: 81.3 kg 83 kg 80.7 kg    Examination:  General exam: Appears calm and comfortable , not in distress.  Respiratory system: Clear to auscultation. Respiratory effort normal. Cardiovascular system: S1 & S2 heard, RRR. No JVD,  No pedal edema. Gastrointestinal system: Abdomen is nondistended, soft and nontender. No organomegaly or masses felt. Normal bowel sounds heard. Central nervous system: Alert and oriented to place and person.  Extremities: able to move both  extremities.  Skin: Healing Stage 3 pressure injury to right heel Psychiatry:Mood & affect appropriate.     Data Reviewed: I have personally reviewed following labs and imaging studies  CBC: Recent Labs  Lab 03/29/19 1400 03/29/19 1537 03/30/19 0358 03/31/19 0400  WBC 9.1  --  8.5 7.7  NEUTROABS 7.8*  --   --   --   HGB 8.5* 8.8* 7.9* 7.6*  HCT 25.5* 26.0* 23.3* 22.0*  MCV 92.4  --  90.3 89.4  PLT 231  --  199 218   Basic Metabolic Panel: Recent Labs  Lab 03/29/19 1717  03/30/19 0358 03/30/19 0956 03/31/19 0400 03/31/19 1137 04/01/19 1306  NA 123*   < > 126* 126* 129* 129* 129*  K 4.8  --  4.5 4.1 4.1  --  4.1  CL 90*  --  95* 92* 94*  --  96*  CO2 23  --  24 25 24   --  24  GLUCOSE 111*  --  88 101* 96  --  114*  BUN 16  --  18 18 22   --  20  CREATININE 0.81  --  0.82 0.84 0.89  --  0.82  CALCIUM 8.6*  --  8.3* 8.5* 8.6*  --  8.5*   < > = values in this interval not displayed.   GFR: Estimated Creatinine Clearance: 50.6 mL/min (by C-G formula based on SCr of 0.82 mg/dL). Liver Function Tests: No results for input(s): AST, ALT, ALKPHOS, BILITOT, PROT, ALBUMIN in the last 168 hours. No results for input(s): LIPASE, AMYLASE in the last 168 hours. Recent Labs  Lab 03/29/19 1717  AMMONIA 18   Coagulation Profile: No results for input(s): INR, PROTIME in the last 168 hours. Cardiac Enzymes: Recent Labs  Lab 03/29/19 1540  TROPONINI 0.05*   BNP (last 3 results) No results for input(s): PROBNP in the last 8760 hours. HbA1C: No results for input(s): HGBA1C in the last 72 hours. CBG: Recent Labs  Lab 04/01/19 1117 04/01/19 1610 04/01/19 2120 04/02/19 0603 04/02/19 1242  GLUCAP 104* 129* 107* 110* 101*   Lipid Profile: No results for input(s): CHOL, HDL, LDLCALC, TRIG, CHOLHDL, LDLDIRECT in the last 72 hours. Thyroid Function Tests: No results for input(s): TSH, T4TOTAL, FREET4, T3FREE, THYROIDAB in the last 72 hours. Anemia Panel: No results for  input(s): VITAMINB12, FOLATE, FERRITIN, TIBC, IRON, RETICCTPCT in the last 72 hours. Sepsis Labs: No results for input(s): PROCALCITON, LATICACIDVEN in the last 168 hours.  Recent Results (from the past 240 hour(s))  Urine culture     Status: None   Collection Time: 03/29/19  2:58 PM  Result Value Ref Range Status   Specimen Description URINE, CATHETERIZED  Final   Special Requests NONE  Final   Culture   Final    NO GROWTH Performed at [facility], [address] [place], [place] [address]    Report Status 03/30/2019 FINAL  Final  SARS  Coronavirus 2 (CEPHEID - Performed in [facility] hospital lab), Hosp Order     Status: None   Collection Time: 03/29/19  5:11 PM  Result Value Ref Range Status   SARS Coronavirus 2 NEGATIVE NEGATIVE Final    Comment: (NOTE) If result is NEGATIVE SARS-CoV-2 target nucleic acids are NOT DETECTED. The SARS-CoV-2 RNA is generally detectable in upper and lower  respiratory specimens during the acute phase of infection. The lowest  concentration of SARS-CoV-2 viral copies this assay can detect is 250  copies / mL. A negative result does not preclude SARS-CoV-2 infection  and should not be used as the sole basis for treatment or other  patient management decisions.  A negative result may occur with  improper specimen collection / handling, submission of specimen other  than nasopharyngeal swab, presence of viral mutation(s) within the  areas targeted by this assay, and inadequate number of viral copies  (<250 copies / mL). A negative result must be combined with clinical  observations, patient history, and epidemiological information. If result is POSITIVE SARS-CoV-2 target nucleic acids are DETECTED. The SARS-CoV-2 RNA is generally detectable in upper and lower  respiratory specimens dur ing the acute phase of infection.  Positive  results are indicative of active infection with SARS-CoV-2.  Clinical  correlation with patient history and  other diagnostic information is  necessary to determine patient infection status.  Positive results do  not rule out bacterial infection or co-infection with other viruses. If result is PRESUMPTIVE POSTIVE SARS-CoV-2 nucleic acids MAY BE PRESENT.   A presumptive positive result was obtained on the submitted specimen  and confirmed on repeat testing.  While 2019 novel coronavirus  (SARS-CoV-2) nucleic acids may be present in the submitted sample  additional confirmatory testing may be necessary for epidemiological  and / or clinical management purposes  to differentiate between  SARS-CoV-2 and other Sarbecovirus currently known to infect humans.  If clinically indicated additional testing with an alternate test  methodology [phone]) is advised. The SARS-CoV-2 RNA is generally  detectable in upper and lower respiratory sp ecimens during the acute  phase of infection. The expected result is Negative. Fact Sheet for Patients:  BoilerBrush.com.cy Fact Sheet for Healthcare Providers: https://pope.com/ This test is not yet approved or cleared by the [place] FDA and has been authorized for detection and/or diagnosis of SARS-CoV-2 by FDA under an Emergency Use Authorization (EUA).  This EUA will remain in effect (meaning this test can be used) for the duration of the COVID-19 declaration under Section 564(b)(1) of the Act, 21 U.S.C. section 360bbb-3(b)(1), unless the authorization is terminated or revoked sooner. Performed at [facility], [address] [place], [place] [address]          Radiology Studies: No results found.      Scheduled Meds: . acetaminophen  500 mg Oral TID  . allopurinol  100 mg Oral QHS  . carvedilol  25 mg Oral BID WC  . feeding supplement (ENSURE ENLIVE)  237 mL Oral TID BM  . ferrous sulfate  325 mg Oral QODAY  . furosemide  20 mg Oral Daily  . gabapentin  100 mg Oral TID  . hydrALAZINE   100 mg Oral TID  . isosorbide mononitrate  30 mg Oral QHS  . leflunomide  20 mg Oral Daily  . lidocaine  1 patch Transdermal Q24H  . linagliptin  5 mg Oral Daily  . olopatadine  1 drop Both Eyes BID  . polyvinyl  alcohol  1 drop Both Eyes Daily  . predniSONE  10 mg Oral Q breakfast  . rivaroxaban  20 mg Oral Q supper  . sacubitril-valsartan  1 tablet Oral Q1200  . sodium chloride flush  3 mL Intravenous Q12H   Continuous Infusions:   LOS: 3 days    Time spent: 25 minutes.     Kathlen ModyVijaya Evalisse Prajapati, MD [facility] Pager [phone]  If 7PM-7AM, please contact night-coverage [email or web address] Password [email or web address] 04/02/2019, 3:20 PM

## 2019-04-03 DIAGNOSIS — M545 Low back pain: Secondary | ICD-10-CM

## 2019-04-03 DIAGNOSIS — E1122 Type 2 diabetes mellitus with diabetic chronic kidney disease: Secondary | ICD-10-CM

## 2019-04-03 DIAGNOSIS — N183 Chronic kidney disease, stage 3 (moderate): Secondary | ICD-10-CM

## 2019-04-03 DIAGNOSIS — G8929 Other chronic pain: Secondary | ICD-10-CM

## 2019-04-03 LAB — GLUCOSE, CAPILLARY
Glucose-Capillary: 91 mg/dL (ref 70–99)
Glucose-Capillary: 99 mg/dL (ref 70–99)
Glucose-Capillary: 112 mg/dL — ABNORMAL HIGH (ref 70–99)
Glucose-Capillary: 122 mg/dL — ABNORMAL HIGH (ref 70–99)

## 2019-04-03 LAB — SODIUM: Sodium: 130 mmol/L — ABNORMAL LOW (ref 135–145)

## 2019-04-03 MED ORDER — GABAPENTIN 100 MG PO CAPS: 100.0000 mg | ORAL_CAPSULE | Freq: Three times a day (TID) | ORAL | 0 refills | Status: DC

## 2019-04-03 NOTE — Progress Notes (Signed)
Physical Therapy Treatment Patient Details Name: [name] [name] MRN: [id number] DOB: August 26, 1930 Today's Date: 04/03/2019    History of Present Illness [name] [name] is a 85 y.o. female with PMH of HTN; DM; CVA; seizures; CAD; and CHF. The patient is presenting with generalized weakness.and acute confusion.    PT Comments    Patient seen for mobility progression. Pt requires max A +2 for functional transfer training. Pt is able to bring buttocks from EOB with use of bed pad for hip extension but is unable to achieve upright posture.  Continue to progress as tolerated with anticipated d/c to SNF for further skilled PT services.    Follow Up Recommendations  SNF     Equipment Recommendations  Other (comment)(TBD next venue) (if pt discharges home will need hoyer lift and wheelchair)   Recommendations for Other Services       Precautions / Restrictions Precautions Precautions: Fall Required Braces or Orthoses: Other Brace Other Brace: prevalon boot on R foot    Mobility  Bed Mobility Overal bed mobility: Needs Assistance Bed Mobility: Supine to Sit;Sit to Supine     Supine to sit: Max assist;+2 for physical assistance;HOB elevated Sit to supine: Max assist;+2 for physical assistance   General bed mobility comments: assist to bring bilat LE/hips to EOB with use of bed pad and to elevate trunk into sitting; cues for sequencing  Transfers Overall transfer level: Needs assistance   Transfers: Sit to/from Stand Sit to Stand: Max assist;+2 physical assistance         General transfer comment: sit to stand X 3 trials with pt able to lift buttocks from bed with assist of bed pad for hip extension; cues for anterior translation of trunk and hand /foot positioning prior to attempts  Ambulation/Gait                 Stairs             Wheelchair Mobility    Modified Rankin (Stroke Patients Only)       Balance Overall balance assessment: Needs  assistance Sitting-balance support: Single extremity supported;Feet supported Sitting balance-Leahy Scale: Fair   Postural control: Posterior lean                                  Cognition Arousal/Alertness: Awake/alert Behavior During Therapy: Flat affect;WFL for tasks assessed/performed Overall Cognitive Status: Within Functional Limits for tasks assessed                                 General Comments: WFL for simple tasks during session      Exercises      General Comments        Pertinent Vitals/Pain Pain Assessment: Faces Faces Pain Scale: No hurt    Home Living                      Prior Function            PT Goals (current goals can now be found in the care plan section) Progress towards PT goals: Progressing toward goals    Frequency    Min 3X/week      PT Plan Current plan remains appropriate    Co-evaluation              AM-PAC PT "6 Clicks" Mobility  Outcome Measure  Help needed turning from your back to your side while in a flat bed without using bedrails?: Total Help needed moving from lying on your back to sitting on the side of a flat bed without using bedrails?: Total Help needed moving to and from a bed to a chair (including a wheelchair)?: Total Help needed standing up from a chair using your arms (e.g., wheelchair or bedside chair)?: Total Help needed to walk in hospital room?: Total Help needed climbing 3-5 steps with a railing? : Total 6 Click Score: 6    End of Session Equipment Utilized During Treatment: Gait belt Activity Tolerance: Patient tolerated treatment well Patient left: with call bell/phone within reach;in bed;with bed alarm set Nurse Communication: Mobility status;Need for lift equipment PT Visit Diagnosis: Other abnormalities of gait and mobility (R26.89);Muscle weakness (generalized) (M62.81);Difficulty in walking, not elsewhere classified (R26.2)     Time:  5397-6734 PT Time Calculation (min) (ACUTE ONLY): 24 min  Charges:  $Therapeutic Activity: 23-37 mins                     Erline Levine, PTA Acute Rehabilitation Services Pager: [phone] Office: [phone]     Carolynne Edouard 04/03/2019, 4:38 PM

## 2019-04-03 NOTE — TOC Progression Note (Signed)
Transition of Care [facility]) - Progression Note    Patient Details  Name: [name] [name] MRN: [id number] Date of Birth: 05/15/1930  [facility] CM/SW Contact  [name] Rubins Adria Devon, RN Phone Number: 04/03/2019, 1:08 PM  Clinical Narrative:     Sherron Monday to [name]. Patient will discharge to her apartment today. [name] [name] would like nurse to call her and go over discharge instructions especially medication schedule. After that PTAR can be called.   Tiffany with [facility] accepted referral.  [name] [name] has arranged 24 hour care for her mother at home.  Expected Discharge Plan: (P) Home w Home Health Services Barriers to Discharge: (P) No Barriers Identified  Expected Discharge Plan and Services Expected Discharge Plan: (P) Home w Home Health Services   Discharge Planning Services: (P) CM Consult Post Acute Care Choice: (P) Home Health Living arrangements for the past 2 months: Skilled Nursing Facility([facility]) Expected Discharge Date: 04/03/19               DME Arranged: N/A DME Agency: (P) NA       HH Arranged: RN, PT, OT, Nurse's Aide, Social Work [employer] Agency: [facility] Date HH Agency Contacted: 04/03/19 Time HH Agency Contacted: 1305 Representative spoke with at [facility] Agency: Tiffany   Social Determinants of Health (SDOH) Interventions    Readmission Risk Interventions No flowsheet data found.

## 2019-04-03 NOTE — Care Management Important Message (Signed)
Important Message  Patient Details  Name: MADDYN SHIRD MRN: [id number] Date of Birth: [birth year]   Medicare Important Message Given:  Yes    Dorena Bodo 04/03/2019, 3:08 PM

## 2019-04-03 NOTE — Plan of Care (Signed)
Adequate for discharge.

## 2019-04-04 ENCOUNTER — Telehealth: Payer: Self-pay | Admitting: Family Medicine

## 2019-04-04 ENCOUNTER — Telehealth: Payer: Self-pay

## 2019-04-04 ENCOUNTER — Other Ambulatory Visit: Payer: Self-pay | Admitting: Pharmacy Technician

## 2019-04-04 DIAGNOSIS — M0579 Rheumatoid arthritis with rheumatoid factor of multiple sites without organ or systems involvement: Secondary | ICD-10-CM

## 2019-04-04 DIAGNOSIS — I152 Hypertension secondary to endocrine disorders: Secondary | ICD-10-CM

## 2019-04-04 DIAGNOSIS — M79604 Pain in right leg: Secondary | ICD-10-CM

## 2019-04-04 DIAGNOSIS — Z79899 Other long term (current) drug therapy: Secondary | ICD-10-CM

## 2019-04-04 DIAGNOSIS — N183 Chronic kidney disease, stage 3 unspecified: Secondary | ICD-10-CM

## 2019-04-04 DIAGNOSIS — E785 Hyperlipidemia, unspecified: Secondary | ICD-10-CM

## 2019-04-04 DIAGNOSIS — E1159 Type 2 diabetes mellitus with other circulatory complications: Secondary | ICD-10-CM

## 2019-04-04 DIAGNOSIS — R55 Syncope and collapse: Secondary | ICD-10-CM

## 2019-04-04 DIAGNOSIS — E1169 Type 2 diabetes mellitus with other specified complication: Secondary | ICD-10-CM

## 2019-04-04 NOTE — Patient Outreach (Signed)
[facility] Customer service manager [facility]) Care Management  04/04/2019  ROMNEY ROBBINS [birth year] [id number]    Incoming call from patient's daughter Victorino Dike. [name] left a voicemail message on cell phone asking for [facility] R[facility] Jennye Moccasin to call her back.  [name] left a message inquiring if Victorino Dike or a pharmacist could come back to their home and do a medication review as patient was previously discharged from the hospital. She also requested a call back from the pharmacist.  Will route note to [facility] Tommye Standard for assistance with this matter.  Makaleigh Reinard P. Tacey Dimaggio, CPhT Musician Care Management [phone]

## 2019-04-04 NOTE — Telephone Encounter (Signed)
Received phone call from patient's daughter to make Palliative aware that patient is now at home. Requested visit from NP. Patient tested negative for covid. Negative Covid Screen. Visit scheduled for Friday 04/07/2019

## 2019-04-04 NOTE — Telephone Encounter (Signed)
Copied from CRM [id number]. Topic: General - Other >> Apr 04, 2019  3:00 PM Dalphine Handing A wrote: Patients daughter called and stated that the hospital bed she uses is no longer working and that [facility] needs Dr. Earlene Plater to send over a prescription for this.

## 2019-04-05 ENCOUNTER — Other Ambulatory Visit: Payer: Self-pay

## 2019-04-05 ENCOUNTER — Emergency Department (HOSPITAL_COMMUNITY): Payer: Medicare Other

## 2019-04-05 ENCOUNTER — Ambulatory Visit (INDEPENDENT_AMBULATORY_CARE_PROVIDER_SITE_OTHER): Payer: Medicare Other | Admitting: Family Medicine

## 2019-04-05 ENCOUNTER — Emergency Department (HOSPITAL_COMMUNITY)
Admission: EM | Admit: 2019-04-05 | Discharge: 2019-04-07 | Disposition: A | Discharge status: Other Acute Inpatient Hospital | Payer: Medicare Other | Attending: Emergency Medicine | Admitting: Emergency Medicine

## 2019-04-05 VITALS — Ht 64.0 in

## 2019-04-05 DIAGNOSIS — R2689 Other abnormalities of gait and mobility: Secondary | ICD-10-CM | POA: Insufficient documentation

## 2019-04-05 DIAGNOSIS — R5383 Other fatigue: Secondary | ICD-10-CM | POA: Insufficient documentation

## 2019-04-05 DIAGNOSIS — I5042 Chronic combined systolic (congestive) and diastolic (congestive) heart failure: Secondary | ICD-10-CM | POA: Diagnosis present

## 2019-04-05 DIAGNOSIS — M48061 Spinal stenosis, lumbar region without neurogenic claudication: Secondary | ICD-10-CM

## 2019-04-05 DIAGNOSIS — E785 Hyperlipidemia, unspecified: Secondary | ICD-10-CM | POA: Diagnosis present

## 2019-04-05 DIAGNOSIS — E1121 Type 2 diabetes mellitus with diabetic nephropathy: Secondary | ICD-10-CM | POA: Diagnosis present

## 2019-04-05 DIAGNOSIS — R5381 Other malaise: Secondary | ICD-10-CM

## 2019-04-05 DIAGNOSIS — E1129 Type 2 diabetes mellitus with other diabetic kidney complication: Secondary | ICD-10-CM | POA: Diagnosis present

## 2019-04-05 DIAGNOSIS — E119 Type 2 diabetes mellitus without complications: Secondary | ICD-10-CM | POA: Diagnosis not present

## 2019-04-05 DIAGNOSIS — R531 Weakness: Secondary | ICD-10-CM | POA: Diagnosis not present

## 2019-04-05 DIAGNOSIS — E1159 Type 2 diabetes mellitus with other circulatory complications: Secondary | ICD-10-CM | POA: Diagnosis present

## 2019-04-05 DIAGNOSIS — E1169 Type 2 diabetes mellitus with other specified complication: Secondary | ICD-10-CM | POA: Diagnosis present

## 2019-04-05 DIAGNOSIS — M1A09X1 Idiopathic chronic gout, multiple sites, with tophus (tophi): Secondary | ICD-10-CM | POA: Diagnosis not present

## 2019-04-05 DIAGNOSIS — R7989 Other specified abnormal findings of blood chemistry: Secondary | ICD-10-CM

## 2019-04-05 DIAGNOSIS — Z1159 Encounter for screening for other viral diseases: Secondary | ICD-10-CM | POA: Diagnosis not present

## 2019-04-05 DIAGNOSIS — Z03818 Encounter for observation for suspected exposure to other biological agents ruled out: Secondary | ICD-10-CM | POA: Diagnosis not present

## 2019-04-05 DIAGNOSIS — Z7901 Long term (current) use of anticoagulants: Secondary | ICD-10-CM | POA: Diagnosis not present

## 2019-04-05 DIAGNOSIS — E1122 Type 2 diabetes mellitus with diabetic chronic kidney disease: Secondary | ICD-10-CM | POA: Diagnosis present

## 2019-04-05 DIAGNOSIS — I509 Heart failure, unspecified: Secondary | ICD-10-CM | POA: Insufficient documentation

## 2019-04-05 DIAGNOSIS — D638 Anemia in other chronic diseases classified elsewhere: Secondary | ICD-10-CM | POA: Diagnosis present

## 2019-04-05 DIAGNOSIS — R0600 Dyspnea, unspecified: Secondary | ICD-10-CM | POA: Diagnosis not present

## 2019-04-05 DIAGNOSIS — Z86711 Personal history of pulmonary embolism: Secondary | ICD-10-CM | POA: Insufficient documentation

## 2019-04-05 DIAGNOSIS — I10 Essential (primary) hypertension: Secondary | ICD-10-CM | POA: Diagnosis not present

## 2019-04-05 DIAGNOSIS — R52 Pain, unspecified: Secondary | ICD-10-CM | POA: Diagnosis not present

## 2019-04-05 DIAGNOSIS — I152 Hypertension secondary to endocrine disorders: Secondary | ICD-10-CM | POA: Diagnosis present

## 2019-04-05 DIAGNOSIS — N183 Chronic kidney disease, stage 3 unspecified: Secondary | ICD-10-CM | POA: Diagnosis present

## 2019-04-05 LAB — CBC WITH DIFFERENTIAL/PLATELET
Abs Immature Granulocytes: 0.03 10*3/uL (ref 0.00–0.07)
Basophils Absolute: 0 10*3/uL (ref 0.0–0.1)
Basophils Relative: 1 %
Eosinophils Absolute: 0.1 10*3/uL (ref 0.0–0.5)
Eosinophils Relative: 1 %
HCT: 27.2 % — ABNORMAL LOW (ref 36.0–46.0)
Hemoglobin: 9 g/dL — ABNORMAL LOW (ref 12.0–15.0)
Immature Granulocytes: 0 %
Lymphocytes Relative: 18 %
Lymphs Abs: 1.5 10*3/uL (ref 0.7–4.0)
MCH: 30.8 pg (ref 26.0–34.0)
MCHC: 33.1 g/dL (ref 30.0–36.0)
MCV: 93.2 fL (ref 80.0–100.0)
Monocytes Absolute: 0.5 10*3/uL (ref 0.1–1.0)
Monocytes Relative: 6 %
Neutro Abs: 6 10*3/uL (ref 1.7–7.7)
Neutrophils Relative %: 74 %
Platelets: 330 10*3/uL (ref 150–400)
RBC: 2.92 MIL/uL — ABNORMAL LOW (ref 3.87–5.11)
RDW: 18.3 % — ABNORMAL HIGH (ref 11.5–15.5)
WBC: 8.1 10*3/uL (ref 4.0–10.5)
nRBC: 0 % (ref 0.0–0.2)

## 2019-04-05 LAB — I-STAT TROPONIN, ED: Troponin i, poc: 0.03 ng/mL (ref 0.00–0.08)

## 2019-04-05 LAB — LACTIC ACID, PLASMA: Lactic Acid, Venous: 1.1 mmol/L (ref 0.5–1.9)

## 2019-04-05 LAB — SARS CORONAVIRUS 2 BY RT PCR (HOSPITAL ORDER, PERFORMED IN ~~LOC~~ HOSPITAL LAB): SARS Coronavirus 2: NEGATIVE

## 2019-04-05 NOTE — Progress Notes (Signed)
Virtual Visit via Video   Due to the COVID-19 pandemic, this visit was completed with telemedicine (audio/video) technology to reduce patient and provider exposure as well as to preserve personal protective equipment.   I connected with [name] [name] by a video enabled telemedicine application and verified that I am speaking with the correct person using two identifiers. Location patient: Home Location provider: [facility], Office Persons participating in the virtual visit: [name] [name], [place], DO Barnie Mort, CMA acting as scribe for Dr. Helane Rima.   I discussed the limitations of evaluation and management by telemedicine and the availability of in person appointments. The patient expressed understanding and agreed to proceed.  Care Team   Patient Care Team: Helane Rima, DO as PCP - General (Family Medicine) Chilton Si, MD as PCP - Cardiology (Cardiology) Arlean Hopping, MD as Rounding Team (Internal Medicine) Casimer Lanius, MD as Consulting Physician (Rheumatology)  Subjective:   HPI:  85 year old with past medical history significant for RA, hypertension, diabetes, CVA, seizure, coronary artery disease and CHF who was recently hospitalized due to generalized weakness altered mental status confusion probably related to polypharmacy and hyponatremia.  When patient was discharged from hospital family was concerned with how she was treated at rehab so they wanted to try in home nurse. They have private CNA's that are caring for her 24/7. She has not been able to move out of the bed. She has tried to have rehab to the home but they are not available until the weekend. Family is concerned that she has become very weak that started increasing from Monday.      Due to generalized weakness, altered mental status, fibromyalgia, gout, physical deconditioning, chronic back pain, and rheumatoid arthritis, patient requires frequent changes in body position  and has an immediate need for a change in body position. Patient requires positioning of the body in ways not feasible with an ordinary bed.   Review of Systems  Constitutional: Negative for chills and fever.  HENT: Negative for hearing loss and tinnitus.   Eyes: Negative for blurred vision and double vision.  Respiratory: Negative for cough.   Cardiovascular: Negative for chest pain and palpitations.  Gastrointestinal: Negative for heartburn and nausea.  Genitourinary: Negative for dysuria and urgency.  Skin: Negative for rash.  Neurological: Negative for dizziness.  Psychiatric/Behavioral: Negative for depression and suicidal ideas.    Patient Active Problem List   Diagnosis Date Noted  . Acute metabolic encephalopathy 03/30/2019  . Pressure injury of skin 01/12/2019  . AMS (altered mental status) 01/09/2019  . Rheumatoid arthritis flare (HCC) 12/23/2018  . Weakness of right lower extremity 12/23/2018  . Abscess of right upper extremity 12/14/2018  . Fever 12/13/2018  . Hand swelling 12/02/2018  . Pain, wrist 12/02/2018  . Hand pain 12/02/2018  . Polyarthritis 12/02/2018  . Spinal stenosis, lumbar 11/19/2018  . Acute encephalopathy 11/19/2018  . History of CVA (cerebrovascular accident) 11/19/2018  . Acute gout 11/15/2018  . Low back pain 11/14/2018  . Chronic back pain 10/27/2018  . Arthralgia of both knees 10/27/2018  . Rheumatoid arthritis involving both knees (HCC) 10/27/2018  . Acute pain 10/21/2018  . Physical deconditioning 09/06/2018  . Hyperlipidemia associated with type 2 diabetes mellitus (HCC), refuses statin 09/06/2018  . Refusal of statin medication by patient 09/04/2018  . Deep vein thrombosis (DVT) of distal vein of right lower extremity (HCC) 07/30/2018  . Hypertensive heart disease with heart failure (HCC) 07/30/2018  . Pulmonary embolism (HCC), 07/2018,  on Xarelto with goal to DC after 6 months if more active 07/10/2018  . Elevated brain natriuretic  peptide (BNP) level   . DCM (dilated cardiomyopathy) (HCC)   . Benign hypertensive heart and kidney disease with CHF and stage 3 chronic kidney disease (HCC) 05/28/2018  . Osteoarthritis 05/06/2018  . Hypokalemia 02/02/2018  . Adhesive capsulitis of right shoulder 10/12/2017  . Idiopathic chronic gout of multiple sites with tophus 08/25/2017  . History of total knee arthroplasty, bilateral 02/18/2017  . History of rotator cuff surgery 02/18/2017  . High risk medications, long-term use 09/21/2016  . Chronic combined systolic and diastolic CHF (congestive heart failure) (HCC) 09/12/2015  . Bilateral edema of lower extremity 06/05/2014  . CKD stage 3 due to type 2 diabetes mellitus (HCC) 06/05/2014  . DM (diabetes mellitus), type 2 with renal complications (HCC) 06/05/2014    Class: Chronic  . Anemia of chronic disease 05/01/2014  . CAD (coronary artery disease) 10/18/2013  . Hypertension associated with diabetes (HCC) 08/08/2013    Class: Chronic  . Fibromyalgia 05/10/2013  . Type 2 diabetes mellitus with hyperlipidemia (HCC), patient declines statin 04/27/2007  . Allergic rhinitis 04/27/2007  . Diverticulosis 04/27/2007    Social History   Tobacco Use  . Smoking status: Never Smoker  . Smokeless tobacco: Never Used  Substance Use Topics  . Alcohol use: No    Alcohol/week: 0.0 standard drinks    Current Outpatient Medications:  .  acetaminophen (TYLENOL) 500 MG tablet, Take 1,000 mg by mouth every 12 (twelve) hours. , Disp: , Rfl:  .  albuterol (PROVENTIL HFA;VENTOLIN HFA) 108 (90 Base) MCG/ACT inhaler, Inhale 1-2 puffs into the lungs every 6 (six) hours as needed for wheezing or shortness of breath., Disp: 1 Inhaler, Rfl: 0 .  allopurinol (ZYLOPRIM) 100 MG tablet, Take 1 tablet (100 mg total) by mouth at bedtime., Disp: 30 tablet, Rfl: 0 .  carvedilol (COREG) 25 MG tablet, Take 1 tablet (25 mg total) by mouth 2 (two) times daily with a meal., Disp: 60 tablet, Rfl: 0 .  D3-1000  25 MCG (1000 UT) capsule, Take 1 capsule (1,000 Units total) by mouth daily. (Patient taking differently: Take 1,000 Units by mouth at bedtime. ), Disp: 30 capsule, Rfl: 0 .  docusate sodium (COLACE) 100 MG capsule, Take 1 capsule (100 mg total) by mouth daily as needed for mild constipation., Disp: 10 capsule, Rfl: 0 .  ENSURE (ENSURE), Take 237 mLs by mouth every evening., Disp: 237 mL, Rfl: 12 .  ferrous sulfate 325 (65 FE) MG tablet, Take 325 mg by mouth every other day., Disp: , Rfl:  .  furosemide (LASIX) 20 MG tablet, Take 1 tablet (20 mg total) by mouth daily., Disp: 30 tablet, Rfl: 0 .  gabapentin (NEURONTIN) 100 MG capsule, Take 1 capsule (100 mg total) by mouth 3 (three) times daily., Disp: 90 capsule, Rfl: 0 .  hydrALAZINE (APRESOLINE) 100 MG tablet, Take 1 tablet (100 mg total) by mouth 3 (three) times daily., Disp: 90 tablet, Rfl: 0 .  hydroxychloroquine (PLAQUENIL) 200 MG tablet, Take 1 tablet (200 mg total) by mouth 2 (two) times daily., Disp: 60 tablet, Rfl: 0 .  hydroxypropyl methylcellulose / hypromellose (ISOPTO TEARS / GONIOVISC) 2.5 % ophthalmic solution, Place 1 drop into both eyes daily., Disp: 15 mL, Rfl: 0 .  isosorbide mononitrate (IMDUR) 30 MG 24 hr tablet, Take 1 tablet (30 mg total) by mouth at bedtime., Disp: 30 tablet, Rfl: 0 .  leflunomide (ARAVA) 20 MG  tablet, Take 1 tablet (20 mg total) by mouth daily., Disp: 30 tablet, Rfl: 0 .  Lidocaine (ASPERCREME LIDOCAINE) 4 % PTCH, Apply 1 Package topically daily. , Disp: , Rfl:  .  LORazepam (ATIVAN) 0.5 MG tablet, Take 0.25 mg by mouth as needed for anxiety. , Disp: , Rfl:  .  Melatonin 3 MG TABS, Take 6 mg by mouth at bedtime. , Disp: , Rfl:  .  Olopatadine HCl 0.2 % SOLN, Place 1 drop into both eyes daily., Disp: 2.5 mL, Rfl: 0 .  potassium chloride SA (K-DUR) 20 MEQ tablet, Take 20 mEq by mouth See admin instructions. Monday and Thursday, Disp: , Rfl:  .  predniSONE (DELTASONE) 10 MG tablet, Take 10 mg by mouth daily  with breakfast., Disp: , Rfl:  .  rivaroxaban (XARELTO) 20 MG TABS tablet, Take 1 tablet (20 mg total) by mouth daily with supper., Disp: 30 tablet, Rfl: 0 .  saccharomyces boulardii (FLORASTOR) 250 MG capsule, Take 250 mg by mouth every other day., Disp: , Rfl:  .  sacubitril-valsartan (ENTRESTO) 49-51 MG, Take 1 tablet by mouth daily at 12 noon., Disp: 30 tablet, Rfl: 0 .  sitaGLIPtin (JANUVIA) 25 MG tablet, Take 12.5 mg by mouth daily. , Disp: , Rfl:   Allergies  Allergen Reactions  . Clonidine Derivatives Swelling    [name]  . Fish Allergy Diarrhea, Swelling and Other (See Comments)    Turns skin "black," but can tolerate white fish Salmon- Diarrhea  . Shellfish Allergy Hives  . Doxycycline Rash  . Indomethacin Other (See Comments)    Reaction not recalled by the patient  . Lyrica [Pregabalin] Other (See Comments)    Hallucinations   . Methyldopa Other (See Comments)    Aldomet (for hypertension): Reaction not recalled by the patient  . Morphine And Related Other (See Comments)    Family reports it drops her bp that she needs iv fluids  . Orange Fruit [Citrus] Other (See Comments)    Indigestion/heartburn  . Strawberry (Diagnostic) Itching  . Cetirizine Hcl Itching and Rash  . Codeine Itching  . Levaquin [Levofloxacin In D5w] Rash  . Tomato Rash   Objective:   VITALS: Per patient if applicable, see vitals. GENERAL: Alert. CARDIOPULMONARY: No increased WOB. Speaking in clear sentences.  PSYCH: Pleasant and cooperative.  Depression screen PHQ 2/9 06/28/2017  Decreased Interest 0  Down, Depressed, Hopeless 0  PHQ - 2 Score 0  Some recent data might be hidden   Assessment and Plan:   Tzippora was seen today for hospitalization follow-up.  Diagnoses and all orders for this visit:  Spinal stenosis of lumbar region without neurogenic claudication Idiopathic chronic gout of multiple sites with  tophus Type 2 diabetes mellitus with diabetic nephropathy, without long-term current use of insulin (HCC) Physical deconditioning Chronic combined systolic and diastolic CHF (congestive heart failure) (HCC)  Patient unable to stay at home as she requires a higher level of care. Sending back to the hospital for admission.   Marland Kitchen COVID-19 Education: The signs and symptoms of COVID-19 were discussed with the patient and how to seek care for testing if needed. The importance of social distancing was discussed today. . Reviewed expectations re: course of current medical issues. . Discussed self-management of symptoms. . Outlined signs and symptoms indicating need for more acute intervention. . Patient verbalized understanding and all questions were answered. Marland Kitchen Health Maintenance issues including appropriate healthy diet, exercise, and smoking avoidance  were discussed with patient. . See orders for this visit as documented in the electronic medical record.  Helane RimaErica Yina Riviere, DO  Records requested if needed. Time spent: 15 minutes, of which >50% was spent in obtaining information about her symptoms, reviewing her previous labs, evaluations, and treatments, counseling her about her condition (please see the discussed topics above), and developing a plan to further investigate it; she had a number of questions which I addressed.

## 2019-04-05 NOTE — Telephone Encounter (Signed)
Patient had virtual visit today.

## 2019-04-05 NOTE — Telephone Encounter (Signed)
See note

## 2019-04-05 NOTE — ED Triage Notes (Signed)
Pt presents to ED with complaint of generalized weakness and fatigue. Pt was discharged from the hospital two days ago but states she has not felt well since discharge. Pt complains of chronic arthritic pain to bilateral legs and feet. She denies nausea, vomiting, fever, headache or other illness. She denies sick contacts.

## 2019-04-05 NOTE — Telephone Encounter (Signed)
Keesa with [facility] would like to notify Dr.Wallace that she will be able to go see her for the skilled nurse consult on the 30th.

## 2019-04-05 NOTE — Telephone Encounter (Signed)
Order printed and faxed to [facility] at [phone].

## 2019-04-05 NOTE — ED Provider Notes (Signed)
[facility]   CSN: [id number] Arrival date & time: 04/05/19  2124    History   Chief Complaint Chief Complaint  Patient presents with  . Fatigue    HPI [name] [name] is a 85 y.o. female.     85 year old female with prior medical history as detailed below presents for evaluation of generalized weakness.  Patient was recently discharged on the 25th.  Patient was initially going to go to a inpatient rehab.  Family decided to take the patient home.  She now presents with persistent and worsening weakness.  Patient is without complaint of specific chest pain, fever, nausea, vomiting, shortness of breath, or other acute complaint.  The history is provided by the patient and medical records.  Illness  Location:  Weakness Severity:  Moderate Onset quality:  Gradual Duration:  2 days Timing:  Constant Progression:  Worsening Chronicity:  New Associated symptoms: no fever     Past Medical History:  Diagnosis Date  . Allergic rhinitis due to pollen 04/27/2007  . Arthritis    "in q joint" (08/07/2013)  . CHF (congestive heart failure) (HCC)   . Coronary atherosclerosis of native coronary artery   . Cough   . Edema 05/03/2013  . Exertional shortness of breath    "sometimes" (08/07/2013)  . First degree atrioventricular block   . Gout, unspecified 04/19/2013  . Hyperlipidemia 04/27/2007  . Hypertension   . Midline low back pain without sciatica 06/27/2014  . Muscle spasm   . Muscle weakness (generalized)   . Onychia and paronychia of toe   . Osteoarthrosis, unspecified whether generalized or localized, unspecified site 04/27/2007  . Other fall   . Other malaise and fatigue   . Pneumonia 05/2018  . Prepatellar bursitis   . Pulmonary embolism (HCC) 07/2018  . Rheumatic nodule 06/28/2017  . Seizures (HCC)   . Stroke (HCC) 01/17/2014  . Type II diabetes mellitus (HCC)    "fasting 90-110s" (08/07/2013)  . Unspecified  vitamin D deficiency     Patient Active Problem List   Diagnosis Date Noted  . Acute metabolic encephalopathy 03/30/2019  . Pressure injury of skin 01/12/2019  . AMS (altered mental status) 01/09/2019  . Rheumatoid arthritis flare (HCC) 12/23/2018  . Weakness of right lower extremity 12/23/2018  . Abscess of right upper extremity 12/14/2018  . Fever 12/13/2018  . Hand swelling 12/02/2018  . Pain, wrist 12/02/2018  . Hand pain 12/02/2018  . Polyarthritis 12/02/2018  . Spinal stenosis, lumbar 11/19/2018  . Acute encephalopathy 11/19/2018  . History of CVA (cerebrovascular accident) 11/19/2018  . Acute gout 11/15/2018  . Low back pain 11/14/2018  . Chronic back pain 10/27/2018  . Arthralgia of both knees 10/27/2018  . Rheumatoid arthritis involving both knees (HCC) 10/27/2018  . Acute pain 10/21/2018  . Physical deconditioning 09/06/2018  . Hyperlipidemia associated with type 2 diabetes mellitus (HCC), refuses statin 09/06/2018  . Refusal of statin medication by patient 09/04/2018  . Deep vein thrombosis (DVT) of distal vein of right lower extremity (HCC) 07/30/2018  . Hypertensive heart disease with heart failure (HCC) 07/30/2018  . Pulmonary embolism (HCC), 07/2018, on Xarelto with goal to DC after 6 months if more active 07/10/2018  . DCM (dilated cardiomyopathy) (HCC)   . Benign hypertensive heart and kidney disease with CHF and stage 3 chronic kidney disease (HCC) 05/28/2018  . Osteoarthritis 05/06/2018  . Hypokalemia 02/02/2018  . Adhesive capsulitis of right shoulder 10/12/2017  . Idiopathic chronic  gout of multiple sites with tophus 08/25/2017  . History of total knee arthroplasty, bilateral 02/18/2017  . History of rotator cuff surgery 02/18/2017  . High risk medications, long-term use 09/21/2016  . Chronic combined systolic and diastolic CHF (congestive heart failure) (HCC) 09/12/2015  . Bilateral edema of lower extremity 06/05/2014  . CKD stage 3 due to type 2  diabetes mellitus (HCC) 06/05/2014  . DM (diabetes mellitus), type 2 with renal complications (HCC) 06/05/2014    Class: Chronic  . Anemia of chronic disease 05/01/2014  . CAD (coronary artery disease) 10/18/2013  . Hypertension associated with diabetes (HCC) 08/08/2013    Class: Chronic  . Fibromyalgia 05/10/2013  . Type 2 diabetes mellitus with hyperlipidemia (HCC), patient declines statin 04/27/2007  . Allergic rhinitis 04/27/2007  . Diverticulosis 04/27/2007    Past Surgical History:  Procedure Laterality Date  . ABDOMINAL HYSTERECTOMY  04/1980  . APPENDECTOMY  1960  . CARDIAC CATHETERIZATION  2003  . CATARACT EXTRACTION W/ INTRAOCULAR LENS  IMPLANT, BILATERAL Bilateral ~ 2012  . CHOLECYSTECTOMY N/A 06/26/2018   Procedure: LAPAROSCOPIC CHOLECYSTECTOMY POSSIBLE INTRAOPERATIVE CHOLANGIOGRAM;  Surgeon: Abigail Miyamoto, MD;  Location: [facility] OR;  Service: General;  Laterality: N/A;  . JOINT REPLACEMENT    . REPLACEMENT TOTAL KNEE Right 09/2005  . RIGHT/LEFT HEART CATH AND CORONARY ANGIOGRAPHY N/A 06/01/2018   Procedure: RIGHT/LEFT HEART CATH AND CORONARY ANGIOGRAPHY;  Surgeon: Corky Crafts, MD;  Location: [facility] INVASIVE CV LAB;  Service: Cardiovascular;  Laterality: N/A;  . SHOULDER OPEN ROTATOR CUFF REPAIR Right 07/1999  . TONSILLECTOMY  08/1974  . TOTAL KNEE ARTHROPLASTY Left 08/07/2013   Procedure: TOTAL KNEE ARTHROPLASTY- LEFT;  Surgeon: Dannielle Huh, MD;  Location: [facility] OR;  Service: Orthopedics;  Laterality: Left;  . TRANSTHORACIC ECHOCARDIOGRAM  2003   EF 55-65%; mild concentric LVH     OB History   No obstetric history on file.      Home Medications    Prior to Admission medications   Medication Sig Start Date End Date Taking? Authorizing Provider  acetaminophen (TYLENOL) 500 MG tablet Take 1,000 mg by mouth every 12 (twelve) hours.     [provider]  albuterol (PROVENTIL HFA;VENTOLIN HFA) 108 (90 Base) MCG/ACT inhaler Inhale 1-2 puffs into the lungs every 6  (six) hours as needed for wheezing or shortness of breath. 12/20/18   Laverna Peace, MD  allopurinol (ZYLOPRIM) 100 MG tablet Take 1 tablet (100 mg total) by mouth at bedtime. 12/20/18   Laverna Peace, MD  carvedilol (COREG) 25 MG tablet Take 1 tablet (25 mg total) by mouth 2 (two) times daily with a meal. 12/20/18   Roberto Scales D, MD  D3-1000 25 MCG (1000 UT) capsule Take 1 capsule (1,000 Units total) by mouth daily. Patient taking differently: Take 1,000 Units by mouth at bedtime.  12/20/18   Laverna Peace, MD  docusate sodium (COLACE) 100 MG capsule Take 1 capsule (100 mg total) by mouth daily as needed for mild constipation. 12/20/18   Laverna Peace, MD  ENSURE (ENSURE) Take 237 mLs by mouth every evening. 12/20/18   Roberto Scales D, MD  ferrous sulfate 325 (65 FE) MG tablet Take 325 mg by mouth every other day.    [provider]  furosemide (LASIX) 20 MG tablet Take 1 tablet (20 mg total) by mouth daily. 12/20/18   Laverna Peace, MD  gabapentin (NEURONTIN) 100 MG capsule Take 1 capsule (100 mg total) by mouth 3 (three) times daily. 04/03/19  Kathlen Mody, MD  hydrALAZINE (APRESOLINE) 100 MG tablet Take 1 tablet (100 mg total) by mouth 3 (three) times daily. 12/20/18   Laverna Peace, MD  hydroxychloroquine (PLAQUENIL) 200 MG tablet Take 1 tablet (200 mg total) by mouth 2 (two) times daily. 12/20/18   Laverna Peace, MD  hydroxypropyl methylcellulose / hypromellose (ISOPTO TEARS / GONIOVISC) 2.5 % ophthalmic solution Place 1 drop into both eyes daily. 12/20/18   Laverna Peace, MD  isosorbide mononitrate (IMDUR) 30 MG 24 hr tablet Take 1 tablet (30 mg total) by mouth at bedtime. 12/20/18   Laverna Peace, MD  leflunomide (ARAVA) 20 MG tablet Take 1 tablet (20 mg total) by mouth daily. 12/20/18   Roberto Scales D, MD  Lidocaine (ASPERCREME LIDOCAINE) 4 % PTCH Apply 1 Package topically daily.     [provider]  LORazepam (ATIVAN) 0.5 MG tablet Take 0.25 mg by  mouth as needed for anxiety.     [provider]  Melatonin 3 MG TABS Take 6 mg by mouth at bedtime.     [provider]  Olopatadine HCl 0.2 % SOLN Place 1 drop into both eyes daily. 12/20/18   Roberto Scales D, MD  potassium chloride SA (K-DUR) 20 MEQ tablet Take 20 mEq by mouth See admin instructions. Monday and Thursday    [provider]  predniSONE (DELTASONE) 10 MG tablet Take 10 mg by mouth daily with breakfast.    [provider]  rivaroxaban (XARELTO) 20 MG TABS tablet Take 1 tablet (20 mg total) by mouth daily with supper. 12/20/18   Laverna Peace, MD  saccharomyces boulardii (FLORASTOR) 250 MG capsule Take 250 mg by mouth every other day.    [provider]  sacubitril-valsartan (ENTRESTO) 49-51 MG Take 1 tablet by mouth daily at 12 noon. 12/20/18   Laverna Peace, MD  sitaGLIPtin (JANUVIA) 25 MG tablet Take 12.5 mg by mouth daily.     [provider]    Family History Family History  Problem Relation Age of Onset  . Heart attack Father     Social History Social History   Tobacco Use  . Smoking status: Never Smoker  . Smokeless tobacco: Never Used  Substance Use Topics  . Alcohol use: No    Alcohol/week: 0.0 standard drinks  . Drug use: No     Allergies   Clonidine derivatives; Fish allergy; Shellfish allergy; Doxycycline; Indomethacin; Lyrica [pregabalin]; Methyldopa; Morphine and related; Orange fruit [citrus]; Strawberry (diagnostic); Cetirizine hcl; Codeine; Levaquin [levofloxacin in d5w]; and Tomato   Review of Systems Review of Systems  Constitutional: Negative for fever.  All other systems reviewed and are negative.    Physical Exam Updated Vital Signs BP (!) 185/102   Pulse 96   Temp 98.6 F (37 C) (Oral)   Resp 20   Wt 78.7 kg   SpO2 98%   BMI 29.78 kg/m   Physical Exam Vitals signs and nursing note reviewed.  Constitutional:      General: She is not in acute distress.    Appearance:  Normal appearance. She is well-developed.  HENT:     Head: Normocephalic and atraumatic.  Eyes:     Conjunctiva/sclera: Conjunctivae normal.     Pupils: Pupils are equal, round, and reactive to light.  Neck:     Musculoskeletal: Normal range of motion and neck supple.  Cardiovascular:     Rate and Rhythm: Normal rate and regular rhythm.     Heart sounds:  Normal heart sounds.  Pulmonary:     Effort: Pulmonary effort is normal. No respiratory distress.     Breath sounds: Normal breath sounds.  Abdominal:     General: There is no distension.     Palpations: Abdomen is soft.     Tenderness: There is no abdominal tenderness.  Musculoskeletal: Normal range of motion.        General: No deformity.  Skin:    General: Skin is warm and dry.  Neurological:     Mental Status: She is alert and oriented to person, place, and time. Mental status is at baseline.      ED Treatments / Results  Labs (all labs ordered are listed, but only abnormal results are displayed) Labs Reviewed  CBC WITH DIFFERENTIAL/PLATELET - Abnormal; Notable for the following components:      Result Value   RBC 2.92 (*)    Hemoglobin 9.0 (*)    HCT 27.2 (*)    RDW 18.3 (*)    All other components within normal limits  SARS CORONAVIRUS 2 (HOSPITAL ORDER, PERFORMED IN [facility])  LACTIC ACID, PLASMA  LACTIC ACID, PLASMA  URINALYSIS, ROUTINE W REFLEX MICROSCOPIC  COMPREHENSIVE METABOLIC PANEL  I-STAT TROPONIN, ED    EKG EKG Interpretation  Date/Time:  Wednesday Apr 05 2019 21:32:43 EDT Ventricular Rate:  100 PR Interval:    QRS Duration: 101 QT Interval:  374 QTC Calculation: 483 R Axis:   -9 Text Interpretation:  Sinus tachycardia Consider anterior infarct Borderline ST depression, lateral leads Artifact in lead(s) I III aVR aVL aVF Confirmed by Kristine RoyalMessick, Burdette Forehand [phone]([id number]) on 04/05/2019 10:17:50 PM   Radiology Dg Chest Port 1 View  Result Date: 04/05/2019 CLINICAL DATA:  Dyspnea EXAM:  PORTABLE CHEST 1 VIEW COMPARISON:  03/29/2019, 01/09/2019, 12/13/2018 FINDINGS: Cardiomegaly. No acute consolidation or effusion. No pneumothorax. Advanced degenerative changes of the shoulders. IMPRESSION: No active disease.  Cardiomegaly. Electronically Signed   By: Jasmine PangKim  Fujinaga [name]   On: 04/05/2019 22:05    Procedures Procedures (including critical care time)  Medications Ordered in ED Medications - No data to display   Initial Impression / Assessment and Plan / ED Course  I have reviewed the triage vital signs and the nursing notes.  Pertinent labs & imaging results that were available during my care of the patient were reviewed by me and considered in my medical decision making (see chart for details).        MDM  Screen complete  [name] CluckHazel C Athey was evaluated in Emergency Department on 04/05/2019 for the symptoms described in the history of present illness. She was evaluated in the context of the global COVID-19 pandemic, which necessitated consideration that the patient might be at risk for infection with the SARS-CoV-2 virus that causes COVID-19. Institutional protocols and algorithms that pertain to the evaluation of patients at risk for COVID-19 are in a state of rapid change based on information released by regulatory bodies including the CDC and federal and state organizations. These policies and algorithms were followed during the patient's care in the ED.   Patient is presenting for evaluation of generalized weakness with recent discharge (on 5/25).   Screening labs ordered and results pending.   Patient likely needs placement for rehab (vs home care with family).   Dr. Eudelia Bunchardama aware of pending labs and disposition.   Final Clinical Impressions(s) / ED Diagnoses   Final diagnoses:  Weakness    ED Discharge Orders    None  Wynetta FinesMessick, Jaylanie Boschee C, MD 04/05/19 [phone]

## 2019-04-06 ENCOUNTER — Telehealth: Payer: Self-pay | Admitting: Family Medicine

## 2019-04-06 DIAGNOSIS — R5381 Other malaise: Secondary | ICD-10-CM | POA: Diagnosis not present

## 2019-04-06 DIAGNOSIS — R531 Weakness: Secondary | ICD-10-CM | POA: Diagnosis not present

## 2019-04-06 LAB — COMPREHENSIVE METABOLIC PANEL
ALT: 9 U/L (ref 0–44)
AST: 15 U/L (ref 15–41)
Albumin: 3.3 g/dL — ABNORMAL LOW (ref 3.5–5.0)
Alkaline Phosphatase: 42 U/L (ref 38–126)
Anion gap: 9 (ref 5–15)
BUN: 17 mg/dL (ref 8–23)
CO2: 24 mmol/L (ref 22–32)
Calcium: 8.9 mg/dL (ref 8.9–10.3)
Chloride: 100 mmol/L (ref 98–111)
Creatinine, Ser: 0.75 mg/dL (ref 0.44–1.00)
GFR calc Af Amer: >60 mL/min (ref >60)
GFR calc non Af Amer: >60 mL/min (ref >60)
Glucose, Bld: 121 mg/dL — ABNORMAL HIGH (ref 70–99)
Potassium: 4 mmol/L (ref 3.5–5.1)
Sodium: 133 mmol/L — ABNORMAL LOW (ref 135–145)
Total Bilirubin: 0.5 mg/dL (ref 0.3–1.2)
Total Protein: 6 g/dL — ABNORMAL LOW (ref 6.5–8.1)

## 2019-04-06 LAB — BRAIN NATRIURETIC PEPTIDE: B Natriuretic Peptide: 4003.8 pg/mL — ABNORMAL HIGH (ref 0.0–100.0)

## 2019-04-06 LAB — URINALYSIS, ROUTINE W REFLEX MICROSCOPIC
Bacteria, UA: NONE SEEN
Bilirubin Urine: NEGATIVE
Glucose, UA: NEGATIVE mg/dL
Hgb urine dipstick: NEGATIVE
Ketones, ur: NEGATIVE mg/dL
Nitrite: NEGATIVE
Protein, ur: NEGATIVE mg/dL
Specific Gravity, Urine: 1.009 (ref 1.005–1.030)
pH: 7 (ref 5.0–8.0)

## 2019-04-06 LAB — LACTIC ACID, PLASMA: Lactic Acid, Venous: 0.9 mmol/L (ref 0.5–1.9)

## 2019-04-06 LAB — CBG MONITORING, ED: Glucose-Capillary: 132 mg/dL — ABNORMAL HIGH (ref 70–99)

## 2019-04-06 MED ORDER — PREDNISONE 20 MG PO TABS
10.0000 mg | ORAL_TABLET | Freq: Every day | ORAL | Status: DC
  Administered 2019-04-06 – 2019-04-07 (×2): 10 mg via ORAL
  Filled 2019-04-06 (×2): qty 1

## 2019-04-06 MED ORDER — SACUBITRIL-VALSARTAN 49-51 MG PO TABS
1.0000 | ORAL_TABLET | Freq: Every day | ORAL | Status: DC
  Administered 2019-04-06 – 2019-04-07 (×2): 1 via ORAL
  Filled 2019-04-06 (×2): qty 1

## 2019-04-06 MED ORDER — GABAPENTIN 100 MG PO CAPS
100.0000 mg | ORAL_CAPSULE | Freq: Three times a day (TID) | ORAL | Status: DC
  Administered 2019-04-06 – 2019-04-07 (×5): 100 mg via ORAL
  Filled 2019-04-06 (×5): qty 1

## 2019-04-06 MED ORDER — ALLOPURINOL 100 MG PO TABS
100.0000 mg | ORAL_TABLET | Freq: Every day | ORAL | Status: DC
  Administered 2019-04-07: 100 mg via ORAL
  Filled 2019-04-06 (×2): qty 1

## 2019-04-06 MED ORDER — RIVAROXABAN 20 MG PO TABS
20.0000 mg | ORAL_TABLET | Freq: Every day | ORAL | Status: DC
  Administered 2019-04-06: 20 mg via ORAL
  Filled 2019-04-06 (×3): qty 1

## 2019-04-06 MED ORDER — HYDRALAZINE HCL 50 MG PO TABS
100.0000 mg | ORAL_TABLET | Freq: Three times a day (TID) | ORAL | Status: DC
  Administered 2019-04-06 – 2019-04-07 (×3): 100 mg via ORAL
  Filled 2019-04-06 (×6): qty 2

## 2019-04-06 MED ORDER — OLOPATADINE HCL 0.1 % OP SOLN
1.0000 [drp] | Freq: Two times a day (BID) | OPHTHALMIC | Status: DC
  Administered 2019-04-06 – 2019-04-07 (×3): 1 [drp] via OPHTHALMIC
  Filled 2019-04-06: qty 5

## 2019-04-06 MED ORDER — HYPROMELLOSE (GONIOSCOPIC) 2.5 % OP SOLN
1.0000 [drp] | Freq: Every day | OPHTHALMIC | Status: DC
  Filled 2019-04-06: qty 15

## 2019-04-06 MED ORDER — HYDROXYCHLOROQUINE SULFATE 200 MG PO TABS
200.0000 mg | ORAL_TABLET | Freq: Two times a day (BID) | ORAL | Status: DC
  Administered 2019-04-06 – 2019-04-07 (×3): 200 mg via ORAL
  Filled 2019-04-06 (×4): qty 1

## 2019-04-06 MED ORDER — POLYVINYL ALCOHOL 1.4 % OP SOLN
1.0000 [drp] | Freq: Every day | OPHTHALMIC | Status: DC
  Administered 2019-04-07: 1 [drp] via OPHTHALMIC
  Filled 2019-04-06 (×3): qty 15

## 2019-04-06 MED ORDER — LINAGLIPTIN 5 MG PO TABS
5.0000 mg | ORAL_TABLET | Freq: Every day | ORAL | Status: DC
  Administered 2019-04-06 – 2019-04-07 (×2): 5 mg via ORAL
  Filled 2019-04-06 (×2): qty 1

## 2019-04-06 MED ORDER — ISOSORBIDE MONONITRATE ER 30 MG PO TB24
30.0000 mg | ORAL_TABLET | Freq: Every day | ORAL | Status: DC
  Administered 2019-04-07: 30 mg via ORAL
  Filled 2019-04-06: qty 1

## 2019-04-06 MED ORDER — LEFLUNOMIDE 20 MG PO TABS
20.0000 mg | ORAL_TABLET | Freq: Every day | ORAL | Status: DC
  Administered 2019-04-06 – 2019-04-07 (×2): 20 mg via ORAL
  Filled 2019-04-06 (×2): qty 1

## 2019-04-06 MED ORDER — ALBUTEROL SULFATE HFA 108 (90 BASE) MCG/ACT IN AERS
1.0000 | INHALATION_SPRAY | Freq: Four times a day (QID) | RESPIRATORY_TRACT | Status: DC | PRN
  Administered 2019-04-07: 2 via RESPIRATORY_TRACT
  Filled 2019-04-06: qty 6.7

## 2019-04-06 MED ORDER — FUROSEMIDE 20 MG PO TABS
20.0000 mg | ORAL_TABLET | Freq: Every day | ORAL | Status: DC
  Administered 2019-04-06 – 2019-04-07 (×2): 20 mg via ORAL
  Filled 2019-04-06 (×2): qty 1

## 2019-04-06 MED ORDER — CARVEDILOL 12.5 MG PO TABS
25.0000 mg | ORAL_TABLET | Freq: Two times a day (BID) | ORAL | Status: DC
  Administered 2019-04-06 – 2019-04-07 (×3): 25 mg via ORAL
  Filled 2019-04-06 (×3): qty 2

## 2019-04-06 MED ORDER — MELATONIN 3 MG PO TABS
6.0000 mg | ORAL_TABLET | Freq: Every day | ORAL | Status: DC
  Administered 2019-04-07: 6 mg via ORAL
  Filled 2019-04-06 (×2): qty 2

## 2019-04-06 MED ORDER — FERROUS SULFATE 325 (65 FE) MG PO TABS
325.0000 mg | ORAL_TABLET | ORAL | Status: DC
  Administered 2019-04-06: 325 mg via ORAL
  Filled 2019-04-06: qty 1

## 2019-04-06 NOTE — ED Notes (Signed)
Pt moved from stretcher to  Regular bed.  Pt tolerates well. Pt has finished a heart healthy tray from dietary.  [employer] reconnected.

## 2019-04-06 NOTE — ED Notes (Addendum)
Pt offered [place] sandwich. Pt declines sandwich but eats applesauce and apple juice.

## 2019-04-06 NOTE — Progress Notes (Signed)
CSW called [place] at [facility] at ph: [phone] on her SECURE VM asking her to look at pt's referral.  CSW will continue to follow for D/C needs.  Dorothe Pea. Bettie Capistran, LCSW, LCAS, CSI Transitions of Care Clinical Social Worker Care Coordination Department Ph: [phone]

## 2019-04-06 NOTE — ED Notes (Signed)
Pt declines supper tray saying she has just eaten.

## 2019-04-06 NOTE — Progress Notes (Signed)
CSW called [name] at [facility] at ph: [phone] on her SECURE VM asking her to look at pt's referral.  CSW will continue to follow for D/C needs.  Dorothe Pea. Jamarie Joplin, LCSW, LCAS, CSI Transitions of Care Clinical Social Worker Care Coordination Department Ph: [phone]

## 2019-04-06 NOTE — NC FL2 (Signed)
MEDICAID FL2 LEVEL OF CARE SCREENING TOOL     IDENTIFICATION  Patient Name: [name] [name] Birthdate: 08/13/1930 Sex: female Admission Date (Current Location): 04/05/2019  [facility] and [place] Number:  Producer, television/film/video and Address:  [facility] [place], [address], [place], [place] [address]      Provider Number: [id number]  Attending Physician Name and Address:  Default, Provider, MD  Relative Name and Phone Number:       Current Level of Care: Hospital Recommended Level of Care: Skilled Nursing Facility Prior Approval Number:    Date Approved/Denied:   PASRR Number: [id number]  Discharge Plan: SNF    Current Diagnoses: Patient Active Problem List   Diagnosis Date Noted  . Acute metabolic encephalopathy 03/30/2019  . Pressure injury of skin 01/12/2019  . AMS (altered mental status) 01/09/2019  . Rheumatoid arthritis flare (HCC) 12/23/2018  . Weakness of right lower extremity 12/23/2018  . Abscess of right upper extremity 12/14/2018  . Fever 12/13/2018  . Hand swelling 12/02/2018  . Pain, wrist 12/02/2018  . Hand pain 12/02/2018  . Polyarthritis 12/02/2018  . Spinal stenosis, lumbar 11/19/2018  . Acute encephalopathy 11/19/2018  . History of CVA (cerebrovascular accident) 11/19/2018  . Acute gout 11/15/2018  . Low back pain 11/14/2018  . Chronic back pain 10/27/2018  . Arthralgia of both knees 10/27/2018  . Rheumatoid arthritis involving both knees (HCC) 10/27/2018  . Acute pain 10/21/2018  . Physical deconditioning 09/06/2018  . Hyperlipidemia associated with type 2 diabetes mellitus (HCC), refuses statin 09/06/2018  . Refusal of statin medication by patient 09/04/2018  . Deep vein thrombosis (DVT) of distal vein of right lower extremity (HCC) 07/30/2018  . Hypertensive heart disease with heart failure (HCC) 07/30/2018  . Pulmonary embolism (HCC), 07/2018, on Xarelto with goal to DC after 6 months if more active 07/10/2018   . DCM (dilated cardiomyopathy) (HCC)   . Benign hypertensive heart and kidney disease with CHF and stage 3 chronic kidney disease (HCC) 05/28/2018  . Osteoarthritis 05/06/2018  . Hypokalemia 02/02/2018  . Adhesive capsulitis of right shoulder 10/12/2017  . Idiopathic chronic gout of multiple sites with tophus 08/25/2017  . History of total knee arthroplasty, bilateral 02/18/2017  . History of rotator cuff surgery 02/18/2017  . High risk medications, long-term use 09/21/2016  . Chronic combined systolic and diastolic CHF (congestive heart failure) (HCC) 09/12/2015  . Bilateral edema of lower extremity 06/05/2014  . CKD stage 3 due to type 2 diabetes mellitus (HCC) 06/05/2014  . DM (diabetes mellitus), type 2 with renal complications (HCC) 06/05/2014    Class: Chronic  . Anemia of chronic disease 05/01/2014  . CAD (coronary artery disease) 10/18/2013  . Hypertension associated with diabetes (HCC) 08/08/2013    Class: Chronic  . Fibromyalgia 05/10/2013  . Type 2 diabetes mellitus with hyperlipidemia (HCC), patient declines statin 04/27/2007  . Allergic rhinitis 04/27/2007  . Diverticulosis 04/27/2007    Orientation RESPIRATION BLADDER Height & Weight     Self, Time, Place, Situation  Normal Continent Weight: 173 lb 8 oz (78.7 kg) Height:     BEHAVIORAL SYMPTOMS/MOOD NEUROLOGICAL BOWEL NUTRITION STATUS      Continent Diet(Heart healthy)  AMBULATORY STATUS COMMUNICATION OF NEEDS Skin   Extensive Assist Verbally Other (Comment)(Diabetic ucler right heel)                       Personal Care Assistance Level of Assistance  Bathing, Feeding, Dressing Bathing  Assistance: Maximum assistance Feeding assistance: Limited assistance Dressing Assistance: Maximum assistance     Functional Limitations Info  Sight, Hearing, Speech Sight Info: Adequate Hearing Info: Adequate Speech Info: Adequate    SPECIAL CARE FACTORS FREQUENCY  PT (By licensed PT), OT (By licensed OT)     PT  Frequency: 5x weekly OT Frequency: 5 weekly            Contractures Contractures Info: Not present    Additional Factors Info  Code Status, Allergies Code Status Info: Full Allergies Info: CLONIDINE DERIVATIVES, FISH ALLERGY, SHELLFISH ALLERGY, DOXYCYCLINE, INDOMETHACIN, LYRICA PREGABALIN, METHYLDOPA, MORPHINE AND RELATED, ORANGE FRUIT CITRUS, STRAWBERRY (DIAGNOSTIC), CETIRIZINE HCL, CODEINE, LEVAQUIN LEVOFLOXACIN IN D5W, TOMATO            Current Medications (04/06/2019):  This is the current hospital active medication list Current Facility-Administered Medications  Medication Dose Route Frequency Provider Last Rate Last Dose  . albuterol (VENTOLIN HFA) 108 (90 Base) MCG/ACT inhaler 1-2 puff  1-2 puff Inhalation Q6H PRN Cardama, Amadeo Garnet, MD      . allopurinol (ZYLOPRIM) tablet 100 mg  100 mg Oral QHS Cardama, Amadeo Garnet, MD      . carvedilol (COREG) tablet 25 mg  25 mg Oral BID WC Cardama, Amadeo Garnet, MD   25 mg at 04/06/19 1104  . ferrous sulfate tablet 325 mg  325 mg Oral QODAY Nira Conn, MD   325 mg at 04/06/19 1048  . furosemide (LASIX) tablet 20 mg  20 mg Oral Daily Cardama, Amadeo Garnet, MD   20 mg at 04/06/19 1048  . gabapentin (NEURONTIN) capsule 100 mg  100 mg Oral TID Nira Conn, MD   100 mg at 04/06/19 1104  . hydrALAZINE (APRESOLINE) tablet 100 mg  100 mg Oral TID Nira Conn, MD   100 mg at 04/06/19 1050  . hydroxychloroquine (PLAQUENIL) tablet 200 mg  200 mg Oral BID Nira Conn, MD   200 mg at 04/06/19 1429  . isosorbide mononitrate (IMDUR) 24 hr tablet 30 mg  30 mg Oral QHS Cardama, Amadeo Garnet, MD      . leflunomide (ARAVA) tablet 20 mg  20 mg Oral Daily Cardama, Amadeo Garnet, MD   20 mg at 04/06/19 1048  . linagliptin (TRADJENTA) tablet 5 mg  5 mg Oral Daily Cardama, Amadeo Garnet, MD   5 mg at 04/06/19 1429  . Melatonin TABS 6 mg  6 mg Oral QHS Cardama, Amadeo Garnet, MD      . olopatadine (PATANOL)  0.1 % ophthalmic solution 1 drop  1 drop Both Eyes BID Cardama, Amadeo Garnet, MD   1 drop at 04/06/19 1053  . polyvinyl alcohol (LIQUIFILM TEARS) 1.4 % ophthalmic solution 1 drop  1 drop Both Eyes Daily Cardama, Amadeo Garnet, MD      . predniSONE (DELTASONE) tablet 10 mg  10 mg Oral Q breakfast Cardama, Amadeo Garnet, MD   10 mg at 04/06/19 1047  . rivaroxaban (XARELTO) tablet 20 mg  20 mg Oral Q supper Cardama, Amadeo Garnet, MD      . sacubitril-valsartan (ENTRESTO) 49-51 mg per tablet  1 tablet Oral Q1200 Nira Conn, MD   1 tablet at 04/06/19 1430   Current Outpatient Medications  Medication Sig Dispense Refill  . acetaminophen (TYLENOL) 500 MG tablet Take 1,000 mg by mouth every 12 (twelve) hours.     Marland Kitchen albuterol (PROVENTIL HFA;VENTOLIN HFA) 108 (90 Base) MCG/ACT inhaler Inhale 1-2 puffs into the lungs every 6 (six)  hours as needed for wheezing or shortness of breath. 1 Inhaler 0  . allopurinol (ZYLOPRIM) 100 MG tablet Take 1 tablet (100 mg total) by mouth at bedtime. 30 tablet 0  . carvedilol (COREG) 25 MG tablet Take 1 tablet (25 mg total) by mouth 2 (two) times daily with a meal. 60 tablet 0  . D3-1000 25 MCG (1000 UT) capsule Take 1 capsule (1,000 Units total) by mouth daily. (Patient taking differently: Take 1,000 Units by mouth at bedtime. ) 30 capsule 0  . docusate sodium (COLACE) 100 MG capsule Take 1 capsule (100 mg total) by mouth daily as needed for mild constipation. 10 capsule 0  . ENSURE (ENSURE) Take 237 mLs by mouth every evening. 237 mL 12  . ferrous sulfate 325 (65 FE) MG tablet Take 325 mg by mouth every other day.    . furosemide (LASIX) 20 MG tablet Take 1 tablet (20 mg total) by mouth daily. 30 tablet 0  . gabapentin (NEURONTIN) 100 MG capsule Take 1 capsule (100 mg total) by mouth 3 (three) times daily. 90 capsule 0  . hydrALAZINE (APRESOLINE) 100 MG tablet Take 1 tablet (100 mg total) by mouth 3 (three) times daily. 90 tablet 0  . hydroxychloroquine  (PLAQUENIL) 200 MG tablet Take 1 tablet (200 mg total) by mouth 2 (two) times daily. 60 tablet 0  . hydroxypropyl methylcellulose / hypromellose (ISOPTO TEARS / GONIOVISC) 2.5 % ophthalmic solution Place 1 drop into both eyes daily. 15 mL 0  . isosorbide mononitrate (IMDUR) 30 MG 24 hr tablet Take 1 tablet (30 mg total) by mouth at bedtime. 30 tablet 0  . leflunomide (ARAVA) 20 MG tablet Take 1 tablet (20 mg total) by mouth daily. 30 tablet 0  . Lidocaine (ASPERCREME LIDOCAINE) 4 % PTCH Apply 1 Package topically daily.     Marland Kitchen LORazepam (ATIVAN) 0.5 MG tablet Take 0.25 mg by mouth as needed for anxiety.     . Melatonin 3 MG TABS Take 6 mg by mouth at bedtime.     . Olopatadine HCl 0.2 % SOLN Place 1 drop into both eyes daily. 2.5 mL 0  . potassium chloride SA (K-DUR) 20 MEQ tablet Take 20 mEq by mouth See admin instructions. Monday and Thursday    . predniSONE (DELTASONE) 10 MG tablet Take 10 mg by mouth daily with breakfast.    . rivaroxaban (XARELTO) 20 MG TABS tablet Take 1 tablet (20 mg total) by mouth daily with supper. 30 tablet 0  . saccharomyces boulardii (FLORASTOR) 250 MG capsule Take 250 mg by mouth every other day.    . sacubitril-valsartan (ENTRESTO) 49-51 MG Take 1 tablet by mouth daily at 12 noon. 30 tablet 0  . sitaGLIPtin (JANUVIA) 25 MG tablet Take 12.5 mg by mouth daily.        Discharge Medications: Please see discharge summary for a list of discharge medications.  Relevant Imaging Results:  Relevant Lab Results:   Additional Information SS#: [id number]  Montine Circle, LCSW

## 2019-04-06 NOTE — ED Notes (Signed)
Daughter called to get an update--please call [name] @ [phone]

## 2019-04-06 NOTE — ED Notes (Signed)
Ordered a heart healthy breakfast--[name] [name]  

## 2019-04-06 NOTE — Evaluation (Signed)
Physical Therapy Evaluation Patient Details Name: [name] [name] MRN: [id number] DOB: 12/23/1929 Today's Date: 04/06/2019   History of Present Illness  85 year old female with prior medical history as detailed below presents for evaluation of generalized weakness.  Patient was recently discharged on the 25th.  Patient was initially going to go to a inpatient rehab.  Family decided to take the patient home.  She now presents with persistent and worsening weakness.   Clinical Impression  Pt admitted with above diagnosis. Pt currently with functional limitations due to the deficits listed below (see PT Problem List). Pt was only able to roll to be cleaned of stool.  Pt states that she has been bed bound for the most part since January.  Did not desire to sit EOB today.  VSS.  Will need SNF on d/c.   Pt will benefit from skilled PT to increase their independence and safety with mobility to allow discharge to the venue listed below.      Follow Up Recommendations SNF;Supervision/Assistance - 24 hour    Equipment Recommendations  Other (comment)(TBA)    Recommendations for Other Services       Precautions / Restrictions Precautions Precautions: Fall Required Braces or Orthoses: Other Brace Other Brace: prevalon boot on R foot Restrictions Weight Bearing Restrictions: No      Mobility  Bed Mobility Overal bed mobility: Needs Assistance Bed Mobility: Rolling Rolling: Min assist;Mod assist;+2 for safety/equipment         General bed mobility comments: Pt was laying in BM.  Assisted nursing to clean pt.  Pt can assist but it is difficult for her to move in bed.    Transfers                 General transfer comment: Pt states she cannot sit on EOB or stand today  Ambulation/Gait                Stairs            Wheelchair Mobility    Modified Rankin (Stroke Patients Only)       Balance                                              Pertinent Vitals/Pain Pain Assessment: Faces Faces Pain Scale: Hurts little more Pain Location: back Pain Descriptors / Indicators: Discomfort Pain Intervention(s): Limited activity within patient's tolerance;Monitored during session;Repositioned    Home Living Family/patient expects to be discharged to:: Skilled nursing facility                 Additional Comments: Pt bedbound since Jan per pt.      Prior Function Level of Independence: Needs assistance   Gait / Transfers Assistance Needed: Pt states essentially bed bound except she stood some with therapy  ADL's / Homemaking Assistance Needed: Dependence in B/D and ADLS        Hand Dominance   Dominant Hand: Right    Extremity/Trunk Assessment   Upper Extremity Assessment Upper Extremity Assessment: Defer to OT evaluation    Lower Extremity Assessment Lower Extremity Assessment: Generalized weakness       Communication   Communication: No difficulties  Cognition Arousal/Alertness: Awake/alert Behavior During Therapy: Flat affect;WFL for tasks assessed/performed Overall Cognitive Status: Within Functional Limits for tasks assessed  General Comments: WFL for simple tasks during session      General Comments      Exercises General Exercises - Lower Extremity Ankle Circles/Pumps: AROM;Both;10 reps;Supine Heel Slides: AROM;Both;10 reps;Supine Hip ABduction/ADduction: AROM;Both;10 reps;Supine   Assessment/Plan    PT Assessment Patient needs continued PT services  PT Problem List Decreased strength;Decreased activity tolerance;Decreased range of motion;Decreased balance;Decreased mobility;Decreased knowledge of use of DME       PT Treatment Interventions DME instruction;Gait training;Functional mobility training;Therapeutic activities;Therapeutic exercise;Balance training;Patient/family education    PT Goals (Current goals can be found in the Care  Plan section)  Acute Rehab PT Goals Patient Stated Goal: get stronger PT Goal Formulation: With patient Time For Goal Achievement: 04/20/19 Potential to Achieve Goals: Good    Frequency Min 2X/week   Barriers to discharge Decreased caregiver support      Co-evaluation               AM-PAC PT "6 Clicks" Mobility  Outcome Measure Help needed turning from your back to your side while in a flat bed without using bedrails?: Total Help needed moving from lying on your back to sitting on the side of a flat bed without using bedrails?: Total Help needed moving to and from a bed to a chair (including a wheelchair)?: Total Help needed standing up from a chair using your arms (e.g., wheelchair or bedside chair)?: Total Help needed to walk in hospital room?: Total Help needed climbing 3-5 steps with a railing? : Total 6 Click Score: 6    End of Session   Activity Tolerance: Patient limited by fatigue Patient left: in bed;with call bell/phone within reach Nurse Communication: Mobility status;Need for lift equipment PT Visit Diagnosis: Other abnormalities of gait and mobility (R26.89);Muscle weakness (generalized) (M62.81);Difficulty in walking, not elsewhere classified (R26.2)    Time: 7846-9629 PT Time Calculation (min) (ACUTE ONLY): 24 min   Charges:   PT Evaluation $PT Eval Moderate Complexity: 1 Mod PT Treatments $Therapeutic Activity: 8-22 mins        Hamp Moreland,PT Acute Rehabilitation Services Pager:  [phone]  Office:  [phone]    Berline Lopes 04/06/2019, 10:38 AM

## 2019-04-06 NOTE — ED Notes (Signed)
Per SW  She is putting in for PT consult for pt

## 2019-04-06 NOTE — Telephone Encounter (Signed)
See note

## 2019-04-06 NOTE — Progress Notes (Signed)
Patient has been faxed out to SNF facilities. Awaiting bed offers. Per patient's daughter [facility] is preferred. Also open to [facility] which was recommended to patient's daughter by patient's PCP.   Geralyn Corwin, LCSW Transitions of Care Department [facility] ED [phone]

## 2019-04-06 NOTE — ED Provider Notes (Signed)
I assumed care of this patient from Dr. Rodena Medin at 2330.  Please see their note for further details of Hx, PE.  Briefly patient is a 85 y.o. female who presented with generalized weakness.  Plan for screening labs and assess for placement..   Screening labs at patient's baseline and reassuring.  We will need PT and social work consult to assist with placement.   Nira Conn, MD 04/06/19 [phone]

## 2019-04-06 NOTE — ED Notes (Signed)
Attempted to contact daughter to provide update. No answer at this time.

## 2019-04-06 NOTE — Consult Note (Signed)
ER Consult Note   [name]:[id number] DOB: 02/10/1930 DOA: 04/05/2019  PCP: Helane Rima, DO Consultants:  Palliative care, Angelique Holm, NP, [phone]; Duke Salvia - cardiology; [facility] - rheumatology; G[facility]re - orthopedics Patient coming from:  Home, but family is requesting placement again; NOK: Daughter, Para March, [phone]  Chief Complaint: Weakness   HPI: [name] [name] is a 85 y.o. female with medical history significant of HTN; DM; CVA; seizures; CAD; and CHF presenting with weakness.  "Let me think of what to say.  You know I've been in and out about all this year.  I went home, the children tried to get me home for my birthday... Yesterday evening Dr. Earlene Plater thought I should come back in."  She lacks strength on her right leg.  She is weak and can't get up on it.  It just gives away.  She was recommended to go to SNF last time and she refused.  Now she is willing.  No SOB.  No cough.  No congestion.  No chest pain.  No weight changes.  Minimal intermittent edema.  "If I can just walk again, I'll be most happy."  She was hospitalized on 2/2 and discharged 2/11due to fever thought to be related to a RA flare; this was treated with prednisone and resumption of home DMARDs after initial antibiotics were discontinued.  She had stable severe L4-S1 stenosis.  She was euvolemic during this hospitalization, with known h/o chronic combined CHF.  She has a prior DVT/PE from 9/19 and Xarelto was continued.  She returned from 3/2-5 for acute encephalopathy of uncertain etiology.  There do not appear to have been significant findings/changes during this hospitalization.  She was hospitalized again from 5/20-25 for acute encephalopathy secondary to polypharmacy; hyponatremia with sodium stable at 129; and deconditioning.  She was discharged to home with home health services.   ED Course:   Trying to get her placed, SW helping.  BNP ordered, BP up, HR up.  ?admission.  Review of  Systems: As per HPI; otherwise review of systems reviewed and negative.     Past Medical History:  Diagnosis Date  . Allergic rhinitis due to pollen 04/27/2007  . Arthritis    "in q joint" (08/07/2013)  . CHF (congestive heart failure) (HCC)   . Coronary atherosclerosis of native coronary artery   . Cough   . Edema 05/03/2013  . Exertional shortness of breath    "sometimes" (08/07/2013)  . First degree atrioventricular block   . Gout, unspecified 04/19/2013  . Hyperlipidemia 04/27/2007  . Hypertension   . Midline low back pain without sciatica 06/27/2014  . Muscle spasm   . Muscle weakness (generalized)   . Onychia and paronychia of toe   . Osteoarthrosis, unspecified whether generalized or localized, unspecified site 04/27/2007  . Other fall   . Other malaise and fatigue   . Pneumonia 05/2018  . Prepatellar bursitis   . Pulmonary embolism (HCC) 07/2018  . Rheumatic nodule 06/28/2017  . Seizures (HCC)   . Stroke (HCC) 01/17/2014  . Type II diabetes mellitus (HCC)    "fasting 90-110s" (08/07/2013)  . Unspecified vitamin D deficiency     Past Surgical History:  Procedure Laterality Date  . ABDOMINAL HYSTERECTOMY  04/1980  . APPENDECTOMY  1960  . CARDIAC CATHETERIZATION  2003  . CATARACT EXTRACTION W/ INTRAOCULAR LENS  IMPLANT, BILATERAL Bilateral ~ 2012  . CHOLECYSTECTOMY N/A 06/26/2018   Procedure: LAPAROSCOPIC CHOLECYSTECTOMY POSSIBLE INTRAOPERATIVE CHOLANGIOGRAM;  Surgeon: Abigail Miyamoto, MD;  Location:  [facility] OR;  Service: General;  Laterality: N/A;  . JOINT REPLACEMENT    . REPLACEMENT TOTAL KNEE Right 09/2005  . RIGHT/LEFT HEART CATH AND CORONARY ANGIOGRAPHY N/A 06/01/2018   Procedure: RIGHT/LEFT HEART CATH AND CORONARY ANGIOGRAPHY;  Surgeon: Corky Crafts, MD;  Location: [facility] INVASIVE CV LAB;  Service: Cardiovascular;  Laterality: N/A;  . SHOULDER OPEN ROTATOR CUFF REPAIR Right 07/1999  . TONSILLECTOMY  08/1974  . TOTAL KNEE ARTHROPLASTY Left 08/07/2013   Procedure:  TOTAL KNEE ARTHROPLASTY- LEFT;  Surgeon: Dannielle Huh, MD;  Location: [facility] OR;  Service: Orthopedics;  Laterality: Left;  . TRANSTHORACIC ECHOCARDIOGRAM  2003   EF 55-65%; mild concentric LVH    Social History   Socioeconomic History  . Marital status: Widowed    Spouse name: Not on file  . Number of children: 3  . Years of education: Not on file  . Highest education level: Not on file  Occupational History  . Not on file  Social Needs  . Financial resource strain: Not on file  . Food insecurity:    Worry: Not on file    Inability: Not on file  . Transportation needs:    Medical: Not on file    Non-medical: Not on file  Tobacco Use  . Smoking status: Never Smoker  . Smokeless tobacco: Never Used  Substance and Sexual Activity  . Alcohol use: No    Alcohol/week: 0.0 standard drinks  . Drug use: No  . Sexual activity: Not Currently  Lifestyle  . Physical activity:    Days per week: Not on file    Minutes per session: Not on file  . Stress: Not on file  Relationships  . Social connections:    Talks on phone: Not on file    Gets together: Not on file    Attends religious service: Not on file    Active member of club or organization: Not on file    Attends meetings of clubs or organizations: Not on file    Relationship status: Not on file  . Intimate partner violence:    Fear of current or ex partner: Not on file    Emotionally abused: Not on file    Physically abused: Not on file    Forced sexual activity: Not on file  Other Topics Concern  . Not on file  Social History Narrative   Widowed   Walks with cane    Allergies  Allergen Reactions  . Clonidine Derivatives Swelling    [name]  . Fish Allergy Diarrhea, Swelling and Other (See Comments)    Turns skin "black," but can tolerate white fish Salmon- Diarrhea  . Shellfish Allergy Hives  . Doxycycline Rash  . Indomethacin Other (See  Comments)    Reaction not recalled by the patient  . Lyrica [Pregabalin] Other (See Comments)    Hallucinations   . Methyldopa Other (See Comments)    Aldomet (for hypertension): Reaction not recalled by the patient  . Morphine And Related Other (See Comments)    Family reports it drops her bp that she needs iv fluids  . Orange Fruit [Citrus] Other (See Comments)    Indigestion/heartburn  . Strawberry (Diagnostic) Itching  . Cetirizine Hcl Itching and Rash  . Codeine Itching  . Levaquin [Levofloxacin In D5w] Rash  . Tomato Rash    Family History  Problem Relation Age of Onset  . Heart attack Father     Prior to  Admission medications   Medication Sig Start Date End Date Taking? Authorizing Provider  acetaminophen (TYLENOL) 500 MG tablet Take 1,000 mg by mouth every 12 (twelve) hours.     [provider]  albuterol (PROVENTIL HFA;VENTOLIN HFA) 108 (90 Base) MCG/ACT inhaler Inhale 1-2 puffs into the lungs every 6 (six) hours as needed for wheezing or shortness of breath. 12/20/18   Laverna PeaceNettey, Shayla D, MD  allopurinol (ZYLOPRIM) 100 MG tablet Take 1 tablet (100 mg total) by mouth at bedtime. 12/20/18   Laverna PeaceNettey, Shayla D, MD  carvedilol (COREG) 25 MG tablet Take 1 tablet (25 mg total) by mouth 2 (two) times daily with a meal. 12/20/18   Roberto ScalesNettey, Shayla D, MD  D3-1000 25 MCG (1000 UT) capsule Take 1 capsule (1,000 Units total) by mouth daily. Patient taking differently: Take 1,000 Units by mouth at bedtime.  12/20/18   Laverna PeaceNettey, Shayla D, MD  docusate sodium (COLACE) 100 MG capsule Take 1 capsule (100 mg total) by mouth daily as needed for mild constipation. 12/20/18   Laverna PeaceNettey, Shayla D, MD  ENSURE (ENSURE) Take 237 mLs by mouth every evening. 12/20/18   Roberto ScalesNettey, Shayla D, MD  ferrous sulfate 325 (65 FE) MG tablet Take 325 mg by mouth every other day.    [provider]  furosemide (LASIX) 20 MG tablet Take 1 tablet (20 mg total) by mouth daily. 12/20/18   Laverna PeaceNettey, Shayla D, MD   gabapentin (NEURONTIN) 100 MG capsule Take 1 capsule (100 mg total) by mouth 3 (three) times daily. 04/03/19   Kathlen ModyAkula, Vijaya, MD  hydrALAZINE (APRESOLINE) 100 MG tablet Take 1 tablet (100 mg total) by mouth 3 (three) times daily. 12/20/18   Laverna PeaceNettey, Shayla D, MD  hydroxychloroquine (PLAQUENIL) 200 MG tablet Take 1 tablet (200 mg total) by mouth 2 (two) times daily. 12/20/18   Laverna PeaceNettey, Shayla D, MD  hydroxypropyl methylcellulose / hypromellose (ISOPTO TEARS / GONIOVISC) 2.5 % ophthalmic solution Place 1 drop into both eyes daily. 12/20/18   Laverna PeaceNettey, Shayla D, MD  isosorbide mononitrate (IMDUR) 30 MG 24 hr tablet Take 1 tablet (30 mg total) by mouth at bedtime. 12/20/18   Laverna PeaceNettey, Shayla D, MD  leflunomide (ARAVA) 20 MG tablet Take 1 tablet (20 mg total) by mouth daily. 12/20/18   Roberto ScalesNettey, Shayla D, MD  Lidocaine (ASPERCREME LIDOCAINE) 4 % PTCH Apply 1 Package topically daily.     [provider]  LORazepam (ATIVAN) 0.5 MG tablet Take 0.25 mg by mouth as needed for anxiety.     [provider]  Melatonin 3 MG TABS Take 6 mg by mouth at bedtime.     [provider]  Olopatadine HCl 0.2 % SOLN Place 1 drop into both eyes daily. 12/20/18   Roberto ScalesNettey, Shayla D, MD  potassium chloride SA (K-DUR) 20 MEQ tablet Take 20 mEq by mouth See admin instructions. Monday and Thursday    [provider]  predniSONE (DELTASONE) 10 MG tablet Take 10 mg by mouth daily with breakfast.    [provider]  rivaroxaban (XARELTO) 20 MG TABS tablet Take 1 tablet (20 mg total) by mouth daily with supper. 12/20/18   Laverna PeaceNettey, Shayla D, MD  saccharomyces boulardii (FLORASTOR) 250 MG capsule Take 250 mg by mouth every other day.    [provider]  sacubitril-valsartan (ENTRESTO) 49-51 MG Take 1 tablet by mouth daily at 12 noon. 12/20/18   Laverna PeaceNettey, Shayla D, MD  sitaGLIPtin (JANUVIA) 25 MG tablet Take 12.5 mg by mouth daily.  [provider]    Physical Exam: Vitals:   04/06/19  1000 04/06/19 1419 04/06/19 1754 04/06/19 1755  BP: (!) 155/88 136/74 (!) 148/85 (!) 148/85  Pulse: (!) 108 100  (!) 106  Resp: (!) 21 17  16   Temp:  98.5 F (36.9 C)  98 F (36.7 C)  TempSrc:  Oral    SpO2: 94% 98%  97%  Weight:         . General:  Appears calm and comfortable and is NAD, chronically ill . Eyes:  PERRL, EOMI, normal lids, iris . ENT:  grossly normal hearing, lips & tongue, mmm; suboptimal dentition . Neck:  no LAD, masses or thyromegaly . Cardiovascular:  RR with mild tachycardia, no m/r/g. No LE edema.  Marland Kitchen. Respiratory:   CTA bilaterally with no wheezes/rales/rhonchi.  Normal respiratory effort. . Abdomen:  soft, NT, ND, NABS . Back:   normal alignment, no CVAT . Skin:  no rash or induration seen on limited exam . Musculoskeletal:  grossly normal tone BUE/BLE, good ROM, no bony abnormality . Psychiatric:  grossly normal mood and affect, speech fluent and appropriate, AOx3 . Neurologic:  CN 2-12 grossly intact, moves all extremities in coordinated fashion, sensation intact    Radiological Exams on Admission: Dg Chest Port 1 View  Result Date: 04/05/2019 CLINICAL DATA:  Dyspnea EXAM: PORTABLE CHEST 1 VIEW COMPARISON:  03/29/2019, 01/09/2019, 12/13/2018 FINDINGS: Cardiomegaly. No acute consolidation or effusion. No pneumothorax. Advanced degenerative changes of the shoulders. IMPRESSION: No active disease.  Cardiomegaly. Electronically Signed   By: Jasmine PangKim  Fujinaga [name]   On: 04/05/2019 22:05    EKG: Independently reviewed.  Sinus tachcyardia with rate 100; nonspecific ST changes with no evidence of acute ischemia   Labs on Admission: I have personally reviewed the available labs and imaging studies at the time of the admission.  Pertinent labs:   Glucose 121 Albumin 3.3 BNP 4003.8; 2955.1 on 3/2 Troponin 0.03 Lactate 1.1, 0.9 WBC 8.1 Hgb 9.0   Assessment/Plan Principal Problem:   Physical deconditioning Active Problems:   Hypertension associated with  diabetes (HCC)   Anemia of chronic disease   CKD stage 3 due to type 2 diabetes mellitus (HCC)   DM (diabetes mellitus), type 2 with renal complications (HCC)   Chronic combined systolic and diastolic CHF (congestive heart failure) (HCC)   Elevated brain natriuretic peptide (BNP) level   Hyperlipidemia associated with type 2 diabetes mellitus (HCC), refuses statin   -Patient with recent admission, hospital d/c on 5/25 -She declined SNF at that time -Since hospital d/c, she and her family have realized that she needs SNF placement and so they brought her back to the ER for this -She does not appear to have acute medical needs requiring hospitalization at this time -She reports feeling fine except for immobility -She was incidentally found to have an elevated BNP - without c/o CP, SOB, worsening edema, weight changes, cough, or hypoxia -This isolated lab abnormality does not appear to require further evaluation and treatment at this time -As such, she is medically optimized and appropriate for d/c to SNF when SW is able to make those arrangements  Thank you for this consult and the opportunity to see this patient again.  Please reconsult [facility] if new issues arise.    Note: This patient has been tested and is negative for the novel coronavirus COVID-19 on 5/27.   Jonah BlueJennifer Christinamarie Tall MD [facility]   How to contact the [facility] Attending or Consulting provider 7A -  7P or covering provider during after hours 7P -7A, for this patient?  1. Check the care team in [facility] and look for a) attending/consulting [facility] provider listed and b) the [facility] team listed 2. Log into [email or web address] and use [facility] universal password to access. If you do not have the password, please contact the hospital operator. 3. Locate the [facility] provider you are looking for under [facility] and page to a number that you can be directly reached. 4. If you still have difficulty reaching the provider, please page the [facility]  (Director on Call) for the Hospitalists listed on amion for assistance.   04/06/2019, 7:21 PM

## 2019-04-06 NOTE — Telephone Encounter (Signed)
Called and spoke with [name] [name], she just spoke with the Child psychotherapist and they are going to admit pt to the hospital. She also spoke with [facility] and she doesn't think that there is anything that we need to do right now as far as inpatient rehab since she is being admitted.   Jeanette thanks Dr. Earlene Plater and her team for everything they've done and all of their care and concern for her mother.   Forwarding to Dr. Earlene Plater as Lorain Childes.

## 2019-04-06 NOTE — ED Notes (Signed)
Patient denies pain and is resting comfortably.  

## 2019-04-06 NOTE — ED Provider Notes (Signed)
Patient care resumed by night provider.  Social work involved to assist in nursing home placement.  [name] had extreme fatigue.  [name] heart rate and blood pressure elevated in the ER.  Due to downtime with epic delay in blood work, BNP return 4000 and with age, fatigue previous provider recommended admission to the hospital for physical therapy, further evaluation and nursing home placement.  Paged hospitalist to discuss.   Blane Ohara, MD 04/07/19 [phone]

## 2019-04-06 NOTE — TOC Initial Note (Addendum)
Transition of Care [facility]) - Initial/Assessment Note    Patient Details  Name: [name] [name] MRN: [id number] Date of Birth: 08-29-1930  Transition of Care [facility]) CM/SW Contact:    Montine Circle, LCSW Phone Number: 04/06/2019, 10:59 AM  Clinical Narrative:                 CSW spoke with patient's daughter, Lattie Corns. Daughter reports that patient was d/c from the hospital 2 days ago and she set up 24/7 care with her, but this was unsuccessful as there was not enough care for her mother. Daughter reports that patient was at [facility] for 19 days and then she took her out of the facility for not making progress. Daughter is wanting patient to go back to SNF. She prefers patient to go to [facility] and tried to get her there the past Saturday, but patient does not have any more SNF days through [facility]. Daughter reports she is working to get benefits reinstated through her PCP. Daughter is also open to patient going to [facility] at the recommendation of her PCP. CSW asked daughter to follow up about if patient's [facility] benefits will be reinstated.  CSW spoke with [place] who reports that patient was admitted 01/12/2019 and d/c 03/29/2019. From 3/5 to 4/28 patient used UHC benefits and after that was covered by Medicaid.   Expected Discharge Plan: Skilled Nursing Facility Barriers to Discharge: Insurance Authorization(Unsure if patient will use UHC to pay for SNF days or use medicaid)   Patient Goals and CMS Choice Patient states their goals for this hospitalization and ongoing recovery are:: Get strength back CMS Medicare.gov Compare Post Acute Care list provided to:: Other (Comment Required)(Daughter) Choice offered to / list presented to : [facility] POA / Guardian([name] via phone)  Expected Discharge Plan and Services Expected Discharge Plan: Skilled Nursing Facility     Post Acute Care Choice: Skilled Nursing Facility Living arrangements for the past 2 months: Skilled Nursing  Facility, Single Family Home                                      Prior Living Arrangements/Services Living arrangements for the past 2 months: Skilled Nursing Facility, Single Family Home Lives with:: Adult Children Patient language and need for interpreter reviewed:: Yes Do you feel safe going back to the place where you live?: Yes      Need for Family Participation in Patient Care: Yes (Comment) Care giver support system in place?: Yes (comment) Current home services: Other (comment)(24/7 care) Criminal Activity/Legal Involvement Pertinent to Current Situation/Hospitalization: No - Comment as needed  Activities of Daily Living      Permission Sought/Granted Permission sought to share information with : Family Supports, Oceanographer granted to share information with : Yes, Verbal Permission Granted  Share Information with NAME: Verlon Au  Permission granted to share info w AGENCY: SNFs  Permission granted to share info w Relationship: Daughter     Emotional Assessment Appearance:: Other (Comment Required(Unable to assess, spoke with daughter) Attitude/Demeanor/Rapport: Unable to Assess Affect (typically observed): Unable to Assess Orientation: : (Spoke with daughter on phone) Alcohol / Substance Use: Never Used Psych Involvement: No (comment)  Admission diagnosis:  Weakness  Patient Active Problem List   Diagnosis Date Noted  . Acute metabolic encephalopathy 03/30/2019  . Pressure injury of skin 01/12/2019  . AMS (altered mental status) 01/09/2019  . Rheumatoid  arthritis flare (HCC) 12/23/2018  . Weakness of right lower extremity 12/23/2018  . Abscess of right upper extremity 12/14/2018  . Fever 12/13/2018  . Hand swelling 12/02/2018  . Pain, wrist 12/02/2018  . Hand pain 12/02/2018  . Polyarthritis 12/02/2018  . Spinal stenosis, lumbar 11/19/2018  . Acute encephalopathy 11/19/2018  . History of CVA (cerebrovascular  accident) 11/19/2018  . Acute gout 11/15/2018  . Low back pain 11/14/2018  . Chronic back pain 10/27/2018  . Arthralgia of both knees 10/27/2018  . Rheumatoid arthritis involving both knees (HCC) 10/27/2018  . Acute pain 10/21/2018  . Physical deconditioning 09/06/2018  . Hyperlipidemia associated with type 2 diabetes mellitus (HCC), refuses statin 09/06/2018  . Refusal of statin medication by patient 09/04/2018  . Deep vein thrombosis (DVT) of distal vein of right lower extremity (HCC) 07/30/2018  . Hypertensive heart disease with heart failure (HCC) 07/30/2018  . Pulmonary embolism (HCC), 07/2018, on Xarelto with goal to DC after 6 months if more active 07/10/2018  . DCM (dilated cardiomyopathy) (HCC)   . Benign hypertensive heart and kidney disease with CHF and stage 3 chronic kidney disease (HCC) 05/28/2018  . Osteoarthritis 05/06/2018  . Hypokalemia 02/02/2018  . Adhesive capsulitis of right shoulder 10/12/2017  . Idiopathic chronic gout of multiple sites with tophus 08/25/2017  . History of total knee arthroplasty, bilateral 02/18/2017  . History of rotator cuff surgery 02/18/2017  . High risk medications, long-term use 09/21/2016  . Chronic combined systolic and diastolic CHF (congestive heart failure) (HCC) 09/12/2015  . Bilateral edema of lower extremity 06/05/2014  . CKD stage 3 due to type 2 diabetes mellitus (HCC) 06/05/2014  . DM (diabetes mellitus), type 2 with renal complications (HCC) 06/05/2014    Class: Chronic  . Anemia of chronic disease 05/01/2014  . CAD (coronary artery disease) 10/18/2013  . Hypertension associated with diabetes (HCC) 08/08/2013    Class: Chronic  . Fibromyalgia 05/10/2013  . Type 2 diabetes mellitus with hyperlipidemia (HCC), patient declines statin 04/27/2007  . Allergic rhinitis 04/27/2007  . Diverticulosis 04/27/2007   PCP:  Helane Rima, DO Pharmacy:   [facility] - [place], [place] - [address] [place] [address] Phone: [phone] Fax: [phone]     Social Determinants of Health (SDOH) Interventions    Readmission Risk Interventions No flowsheet data found.

## 2019-04-06 NOTE — ED Notes (Signed)
Pt CBG 132, nurse notified

## 2019-04-06 NOTE — Telephone Encounter (Signed)
Copied from CRM [id number]. Topic: Quick Communication - See Telephone Encounter >> Apr 06, 2019 10:30 AM Fanny Bien wrote: CRM for notification. See Telephone encounter for: 04/06/19. Pt daughter Koren Bound calling and stated that pt went to ER for evaluation and they are suggesting rehab. Pt daughter states that she needs Dr Earlene Plater to send over information to [facility] to states that she needs rehab services.Hospital is wanting to discharge patient today.  Maryan Char are wanting to mover her today. Please advise

## 2019-04-07 ENCOUNTER — Other Ambulatory Visit: Payer: Medicare Other | Admitting: Adult Health Nurse Practitioner

## 2019-04-07 DIAGNOSIS — R531 Weakness: Secondary | ICD-10-CM | POA: Diagnosis not present

## 2019-04-07 DIAGNOSIS — I469 Cardiac arrest, cause unspecified: Secondary | ICD-10-CM | POA: Diagnosis not present

## 2019-04-07 DIAGNOSIS — Z7401 Bed confinement status: Secondary | ICD-10-CM | POA: Diagnosis not present

## 2019-04-07 DIAGNOSIS — M2550 Pain in unspecified joint: Secondary | ICD-10-CM | POA: Diagnosis not present

## 2019-04-07 LAB — CBG MONITORING, ED: Glucose-Capillary: 123 mg/dL — ABNORMAL HIGH (ref 70–99)

## 2019-04-07 NOTE — ED Notes (Signed)
Attempted to call report to [facility]

## 2019-04-07 NOTE — ED Notes (Signed)
Patient denies pain and is resting comfortably.  

## 2019-04-07 NOTE — ED Notes (Signed)
Breakfast tray ordered 

## 2019-04-07 NOTE — ED Notes (Signed)
PTAR for transport.  

## 2019-04-07 NOTE — ED Notes (Signed)
Pt daughter Para March would like an update [phone]

## 2019-04-07 NOTE — Progress Notes (Addendum)
1:30pm: CSW spoke with Lattie Corns who stated that she would like to accept the offer from [facility]. CSW coordinated communication with French Ana in admissions at [facility] and [facility] to discuss documentation and financial requirements.  11am: CSW spoke with patient's daughter Para March to provide her with an update. CSW explained to [place] the lack of her mother's SNF days left available through her insurance. Lattie Corns stated that she placed a call to [employer], who informed her that her mother did have SNF days left. Lattie Corns requested CSW to call [employer] to see if AF could reach out to [employer] to discuss her [employer] coverage. CSW explained to [place] that [facility] had accepted her mother but that she would be required to pay [phone] prior to admission, Para March states agreement to paying that amount if [employer] declines the patient.  CSW spoke with Britt Boozer at [employer] who agreed to reach out to [employer] to discuss this patient's insurance benefits and will return call to CSW when more information is available.  10:10am: Patient was not accepted at [facility].  CSW was notified by Kennith Center at [facility] that patient will need to pay her portion of [phone] prior to admission.    8:50am: CSW spoke with Janie in admissions at [facility] and she stated that the Director of Nursing is reviewing the patient and will notify CSW with a response when available.  Edwin Dada, MSW, LCSW-A Clinical Social Worker [name] [phone]

## 2019-04-07 NOTE — ED Notes (Signed)
Pt cleaned of stool and dressed. Hold afternoon hyralizine per PA

## 2019-04-07 NOTE — Progress Notes (Signed)
CSW received a call from [place] at [facility] stating the patient has been offered a bed and has been accepted and that the pt can arrive on 5/29.  The pt's accepting doctor is SNF MD.  The room number will be 605.  The number for report is [phone].  French Ana in admission is requesting that pt's RN:  1. Please update PTAR that they will need to go to the BACK DOOR of the SNF on arrival.  2. Please send HARD COPIES of scripts if there are any..  3. Please send anything you would normally discard, I.e. insulin, inhalers, eye drop, etc.  CSW will update RN and EDP.  Please reconsult if future social work needs arise.  CSW signing off, as social work intervention is no longer needed.  Dorothe Pea. Rustin Erhart, LCSW, LCAS, CSI Transitions of Care Clinical Social Worker Care Coordination Department Ph: [phone]

## 2019-04-08 DIAGNOSIS — M6281 Muscle weakness (generalized): Secondary | ICD-10-CM | POA: Diagnosis not present

## 2019-04-08 DIAGNOSIS — R2689 Other abnormalities of gait and mobility: Secondary | ICD-10-CM | POA: Diagnosis not present

## 2019-04-08 DIAGNOSIS — G9341 Metabolic encephalopathy: Secondary | ICD-10-CM | POA: Diagnosis not present

## 2019-04-09 DIAGNOSIS — R2689 Other abnormalities of gait and mobility: Secondary | ICD-10-CM | POA: Diagnosis not present

## 2019-04-09 DIAGNOSIS — G9341 Metabolic encephalopathy: Secondary | ICD-10-CM | POA: Diagnosis not present

## 2019-04-09 DIAGNOSIS — M6281 Muscle weakness (generalized): Secondary | ICD-10-CM | POA: Diagnosis not present

## 2019-04-10 DIAGNOSIS — M069 Rheumatoid arthritis, unspecified: Secondary | ICD-10-CM | POA: Diagnosis not present

## 2019-04-10 DIAGNOSIS — M6281 Muscle weakness (generalized): Secondary | ICD-10-CM | POA: Diagnosis not present

## 2019-04-10 DIAGNOSIS — G9340 Encephalopathy, unspecified: Secondary | ICD-10-CM | POA: Diagnosis not present

## 2019-04-10 DIAGNOSIS — R1312 Dysphagia, oropharyngeal phase: Secondary | ICD-10-CM | POA: Diagnosis not present

## 2019-04-10 DIAGNOSIS — R2689 Other abnormalities of gait and mobility: Secondary | ICD-10-CM | POA: Diagnosis not present

## 2019-04-10 DIAGNOSIS — G9341 Metabolic encephalopathy: Secondary | ICD-10-CM | POA: Diagnosis not present

## 2019-04-10 DIAGNOSIS — R279 Unspecified lack of coordination: Secondary | ICD-10-CM | POA: Diagnosis not present

## 2019-04-10 DIAGNOSIS — M48061 Spinal stenosis, lumbar region without neurogenic claudication: Secondary | ICD-10-CM | POA: Diagnosis not present

## 2019-04-10 DIAGNOSIS — R531 Weakness: Secondary | ICD-10-CM | POA: Diagnosis not present

## 2019-04-10 DIAGNOSIS — E119 Type 2 diabetes mellitus without complications: Secondary | ICD-10-CM | POA: Diagnosis not present

## 2019-04-11 DIAGNOSIS — M6281 Muscle weakness (generalized): Secondary | ICD-10-CM | POA: Diagnosis not present

## 2019-04-11 DIAGNOSIS — G9341 Metabolic encephalopathy: Secondary | ICD-10-CM | POA: Diagnosis not present

## 2019-04-11 DIAGNOSIS — R1312 Dysphagia, oropharyngeal phase: Secondary | ICD-10-CM | POA: Diagnosis not present

## 2019-04-11 DIAGNOSIS — R279 Unspecified lack of coordination: Secondary | ICD-10-CM | POA: Diagnosis not present

## 2019-04-11 DIAGNOSIS — G9340 Encephalopathy, unspecified: Secondary | ICD-10-CM | POA: Diagnosis not present

## 2019-04-11 DIAGNOSIS — R2689 Other abnormalities of gait and mobility: Secondary | ICD-10-CM | POA: Diagnosis not present

## 2019-04-12 DIAGNOSIS — G9341 Metabolic encephalopathy: Secondary | ICD-10-CM | POA: Diagnosis not present

## 2019-04-12 DIAGNOSIS — M6281 Muscle weakness (generalized): Secondary | ICD-10-CM | POA: Diagnosis not present

## 2019-04-12 DIAGNOSIS — G9340 Encephalopathy, unspecified: Secondary | ICD-10-CM | POA: Diagnosis not present

## 2019-04-12 DIAGNOSIS — R279 Unspecified lack of coordination: Secondary | ICD-10-CM | POA: Diagnosis not present

## 2019-04-12 DIAGNOSIS — R2689 Other abnormalities of gait and mobility: Secondary | ICD-10-CM | POA: Diagnosis not present

## 2019-04-12 DIAGNOSIS — R1312 Dysphagia, oropharyngeal phase: Secondary | ICD-10-CM | POA: Diagnosis not present

## 2019-04-12 NOTE — Discharge Summary (Signed)
Physician Discharge Summary  [name] [name] ZOX:[id number] DOB: 14-Sep-1930 DOA: 03/29/2019  PCP: Helane Rima, DO  Admit date: 03/29/2019 Discharge date: 04/02/2019  Admitted From:Home Disposition:  Home  Recommendations for Outpatient Follow-up:  1. Follow up with PCP in 1-2 weeks 2. Please obtain BMP/CBC in one week   Home Health:yes  Discharge Condition:stable.  CODE STATUS: full code.  Diet recommendation: Heart Healthy    Brief/Interim Summary: 85 year old with past medical history significant for hypertension, diabetes, CVA, seizure, coronary artery disease and CHF who presented with generalized weakness altered mental status confusion probably related to polypharmacy and hyponatremia.  Patient was discharged  from her last hospitalization, she was too sleepy or in too much pain to be able to participate with physical therapist. Patient recently discharged from a skilled nursing facility to home. She was brought by family to the hospital with concern of confusion, generalized weakness, altered mental status. Evaluation in the ED patient was noted to have a sodium of 123 and noted to be lethargic.  Discharge Diagnoses:  Principal Problem:   Acute encephalopathy Active Problems:   Fibromyalgia   CKD stage 3 due to type 2 diabetes mellitus (HCC)   DM (diabetes mellitus), type 2 with renal complications (HCC)   Chronic combined systolic and diastolic CHF (congestive heart failure) (HCC)   Deep vein thrombosis (DVT) of distal vein of right lower extremity (HCC)   Hypertensive heart disease with heart failure (HCC)   Physical deconditioning   Chronic back pain   Rheumatoid arthritis involving both knees (HCC)   History of CVA (cerebrovascular accident)   Acute metabolic encephalopathy   Acute encephalopathy: probably sec to polypharmacy.  Resolved.  Adjusted her medications.    Hyponatremia:   Discussed with RN to get urine sodium and urine osmolality.   Improving slowly. Sodium improved to 130. Urine sodium of 24.  Urine osmolality is 269, serum osmolality 256. tsh 1.521.   Physical deconditioning, chronic back pain and spinal stenosis:  - recommend outpatient follow up with orthopedic surgery.  - will need continued rehab on discharge.  - continue with lidocaine patch and tramadol for pain.  - weight bearing as tolerated.   Healing Stage 3 pressure injury to right heel WOUND care consulted and recommendations given.   H/o of DVT: Resume xarelto.    H/o chronic combined CHF:  She appears compensated.    Type 2 dm:  Diet controlled, stable.    CHRONIC RA:  Resume prednisone.    H/o of gout.    Anemia of chronic disease:  Transfuse to keep hemoglobin greater than 7.       Discharge Instructions  Discharge Instructions    Diet - low sodium heart healthy   Complete by:  As directed      Allergies as of 04/03/2019      Reactions   Clonidine Derivatives Swelling   [name]   Fish Allergy Diarrhea, Swelling, Other (See Comments)   Turns skin "black," but can tolerate white fish Salmon- Diarrhea   Shellfish Allergy Hives   Doxycycline Rash   Indomethacin Other (See Comments)   Reaction not recalled by the patient   Lyrica [pregabalin] Other (See Comments)   Hallucinations   Methyldopa Other (See Comments)   Aldomet (for hypertension): Reaction not recalled by the patient   Morphine And Related Other (See Comments)   Family reports it drops her bp that she needs iv fluids   Orange Fruit [  citrus] Other (See Comments)   Indigestion/heartburn   Strawberry (diagnostic) Itching   Cetirizine Hcl Itching, Rash   Codeine Itching   Levaquin [levofloxacin In D5w] Rash   Tomato Rash      Medication List    STOP taking these medications   HYDROcodone-acetaminophen 5-325 MG tablet Commonly known as:  NORCO/VICODIN      TAKE these medications   acetaminophen 500 MG tablet Commonly known as:  TYLENOL Take 1,000 mg by mouth every 12 (twelve) hours.   albuterol 108 (90 Base) MCG/ACT inhaler Commonly known as:  VENTOLIN HFA Inhale 1-2 puffs into the lungs every 6 (six) hours as needed for wheezing or shortness of breath.   allopurinol 100 MG tablet Commonly known as:  ZYLOPRIM Take 1 tablet (100 mg total) by mouth at bedtime.   Aspercreme Lidocaine 4 % Ptch Generic drug:  Lidocaine Apply 1 Package topically daily.   carvedilol 25 MG tablet Commonly known as:  COREG Take 1 tablet (25 mg total) by mouth 2 (two) times daily with a meal.   D3-1000 25 MCG (1000 UT) capsule Generic drug:  Cholecalciferol Take 1 capsule (1,000 Units total) by mouth daily. What changed:  when to take this   docusate sodium 100 MG capsule Commonly known as:  COLACE Take 1 capsule (100 mg total) by mouth daily as needed for mild constipation.   Ensure Take 237 mLs by mouth every evening.   ferrous sulfate 325 (65 FE) MG tablet Take 325 mg by mouth every other day.   Florastor 250 MG capsule Generic drug:  saccharomyces boulardii Take 250 mg by mouth every other day.   furosemide 20 MG tablet Commonly known as:  LASIX Take 1 tablet (20 mg total) by mouth daily.   gabapentin 100 MG capsule Commonly known as:  NEURONTIN Take 1 capsule (100 mg total) by mouth 3 (three) times daily. What changed:    medication strength  how much to take  when to take this  additional instructions   hydrALAZINE 100 MG tablet Commonly known as:  APRESOLINE Take 1 tablet (100 mg total) by mouth 3 (three) times daily.   hydroxychloroquine 200 MG tablet Commonly known as:  PLAQUENIL Take 1 tablet (200 mg total) by mouth 2 (two) times daily.   hydroxypropyl methylcellulose / hypromellose 2.5 % ophthalmic solution Commonly known as:  ISOPTO TEARS / GONIOVISC Place 1 drop into both eyes daily.   isosorbide mononitrate 30  MG 24 hr tablet Commonly known as:  IMDUR Take 1 tablet (30 mg total) by mouth at bedtime.   leflunomide 20 MG tablet Commonly known as:  ARAVA Take 1 tablet (20 mg total) by mouth daily.   LORazepam 0.5 MG tablet Commonly known as:  ATIVAN Take 0.25 mg by mouth as needed for anxiety.   Melatonin 3 MG Tabs Take 6 mg by mouth at bedtime.   Olopatadine HCl 0.2 % Soln Place 1 drop into both eyes daily.   potassium chloride SA 20 MEQ tablet Commonly known as:  K-DUR Take 20 mEq by mouth See admin instructions. Monday and Thursday   predniSONE 10 MG tablet Commonly known as:  DELTASONE Take 10 mg by mouth daily with breakfast.   rivaroxaban 20 MG Tabs tablet Commonly known as:  XARELTO Take 1 tablet (20 mg total) by mouth daily with supper.   sacubitril-valsartan 49-51 MG Commonly known as:  Entresto Take 1 tablet by mouth daily at 12 noon.   sitaGLIPtin 25 MG tablet Commonly known  as:  JANUVIA Take 12.5 mg by mouth daily.      Follow-up Information    Helane Rima, DO. Schedule an appointment as soon as possible for a visit in 1 week(s).   Specialty:  Family Medicine Contact information: [address] [place] [address] [phone]        Chilton Si, MD .   Specialty:  Cardiology Contact information: [address] [place] [address] [phone]        [facility] Follow up.   Specialty:  Home Health Services Contact information: [address] [place] [address] [phone]          Allergies  Allergen Reactions  . Clonidine Derivatives Swelling    [name]  . Fish Allergy Diarrhea, Swelling and Other (See Comments)    Turns skin "black," but can tolerate white fish Salmon- Diarrhea  . Shellfish Allergy Hives  . Doxycycline Rash  . Indomethacin Other (See Comments)    Reaction not recalled by the patient  . Lyrica  [Pregabalin] Other (See Comments)    Hallucinations   . Methyldopa Other (See Comments)    Aldomet (for hypertension): Reaction not recalled by the patient  . Morphine And Related Other (See Comments)    Family reports it drops her bp that she needs iv fluids  . Orange Fruit [Citrus] Other (See Comments)    Indigestion/heartburn  . Strawberry (Diagnostic) Itching  . Cetirizine Hcl Itching and Rash  . Codeine Itching  . Levaquin [Levofloxacin In D5w] Rash  . Tomato Rash    Consultations: None.   Procedures/Studies: Ct Head Wo Contrast  Result Date: 03/29/2019 CLINICAL DATA:  Weakness EXAM: CT HEAD WITHOUT CONTRAST TECHNIQUE: Contiguous axial images were obtained from the base of the skull through the vertex without intravenous contrast. COMPARISON:  Head CT 01/09/2019 FINDINGS: Brain: There is no mass, hemorrhage or extra-axial collection. The size and configuration of the ventricles and extra-axial CSF spaces are normal. There is hypoattenuation of the white matter, most commonly indicating chronic small vessel disease. Vascular: No abnormal hyperdensity of the major intracranial arteries or dural venous sinuses. No intracranial atherosclerosis. Skull: The visualized skull base, calvarium and extracranial soft tissues are normal. Sinuses/Orbits: No fluid levels or advanced mucosal thickening of the visualized paranasal sinuses. No mastoid or middle ear effusion. The orbits are normal. IMPRESSION: Chronic small vessel ischemia without acute intracranial abnormality. Electronically Signed   By: Deatra Robinson [name]   On: 03/29/2019 15:56   Dg Chest Port 1 View  Result Date: 04/05/2019 CLINICAL DATA:  Dyspnea EXAM: PORTABLE CHEST 1 VIEW COMPARISON:  03/29/2019, 01/09/2019, 12/13/2018 FINDINGS: Cardiomegaly. No acute consolidation or effusion. No pneumothorax. Advanced degenerative changes of the shoulders. IMPRESSION: No active disease.  Cardiomegaly. Electronically Signed   By: Jasmine Pang  [name]   On: 04/05/2019 22:05   Dg Chest Portable 1 View  Result Date: 03/29/2019 CLINICAL DATA:  Increased somnolence EXAM: PORTABLE CHEST 1 VIEW COMPARISON:  01/09/2019 FINDINGS: Cardiac shadow remains enlarged. Tortuous aorta with calcifications is again noted. No focal infiltrate or sizable effusion is seen. No significant vascular congestion is noted. No bony abnormality is noted. IMPRESSION: No acute abnormality noted. Electronically Signed   By: Alcide Clever [name]   On: 03/29/2019 15:26    No new complaints.    Subjective:   Discharge Exam: Vitals:   04/03/19 1117 04/03/19 1520  BP: 120/63 130/67  Pulse:  79 78  Resp: 20 15  Temp: 98.2 F (36.8 C) 98.2 F (36.8 C)  SpO2: 98% 99%   Vitals:   04/03/19 0359 04/03/19 0744 04/03/19 1117 04/03/19 1520  BP: 126/68 137/78 120/63 130/67  Pulse: 77 81 79 78  Resp: Temp: 98.4 F (36.9 C) 98.7 F (37.1 C) 98.2 F (36.8 C) 98.2 F (36.8 C)  TempSrc: Oral Oral Oral Oral  SpO2: 98% 98% 98% 99%  Weight: 78.7 kg     Height:        General: Pt is alert, awake, not in acute distress Cardiovascular: RRR, S1/S2 +, no rubs, no gallops Respiratory: CTA bilaterally, no wheezing, no rhonchi Abdominal: Soft, NT, ND, bowel sounds + Extremities: no edema, no cyanosis    The results of significant diagnostics from this hospitalization (including imaging, microbiology, ancillary and laboratory) are listed below for reference.     Microbiology: Recent Results (from the past 240 hour(s))  SARS Coronavirus 2 (CEPHEID - Performed in [facility] hospital lab), Hosp Order     Status: None   Collection Time: 04/05/19  9:50 PM  Result Value Ref Range Status   SARS Coronavirus 2 NEGATIVE NEGATIVE Final    Comment: (NOTE) If result is NEGATIVE SARS-CoV-2 target nucleic acids are NOT DETECTED. The SARS-CoV-2 RNA is generally detectable in upper and lower  respiratory specimens during the acute phase of infection. The lowest   concentration of SARS-CoV-2 viral copies this assay can detect is 250  copies / mL. A negative result does not preclude SARS-CoV-2 infection  and should not be used as the sole basis for treatment or other  patient management decisions.  A negative result may occur with  improper specimen collection / handling, submission of specimen other  than nasopharyngeal swab, presence of viral mutation(s) within the  areas targeted by this assay, and inadequate number of viral copies  (<250 copies / mL). A negative result must be combined with clinical  observations, patient history, and epidemiological information. If result is POSITIVE SARS-CoV-2 target nucleic acids are DETECTED. The SARS-CoV-2 RNA is generally detectable in upper and lower  respiratory specimens dur ing the acute phase of infection.  Positive  results are indicative of active infection with SARS-CoV-2.  Clinical  correlation with patient history and other diagnostic information is  necessary to determine patient infection status.  Positive results do  not rule out bacterial infection or co-infection with other viruses. If result is PRESUMPTIVE POSTIVE SARS-CoV-2 nucleic acids MAY BE PRESENT.   A presumptive positive result was obtained on the submitted specimen  and confirmed on repeat testing.  While 2019 novel coronavirus  (SARS-CoV-2) nucleic acids may be present in the submitted sample  additional confirmatory testing may be necessary for epidemiological  and / or clinical management purposes  to differentiate between  SARS-CoV-2 and other Sarbecovirus currently known to infect humans.  If clinically indicated additional testing with an alternate test  methodology [phone]) is advised. The SARS-CoV-2 RNA is generally  detectable in upper and lower respiratory sp ecimens during the acute  phase of infection. The expected result is Negative. Fact Sheet for Patients:  BoilerBrush.com.cy Fact Sheet  for Healthcare Providers: https://pope.com/ This test is not yet approved or cleared by the [place] FDA and has been authorized for detection and/or diagnosis of SARS-CoV-2 by FDA under an Emergency Use Authorization (EUA).  This EUA will remain in effect (meaning this test can be used) for the duration of  the COVID-19 declaration under Section 564(b)(1) of the Act, 21 U.S.C. section 360bbb-3(b)(1), unless the authorization is terminated or revoked sooner. Performed at [facility], [address] [place], [place] [address]      Labs: BNP (last 3 results) Recent Labs    07/23/18 2100 01/09/19 1657 04/06/19 0306  BNP 138.3* 2,955.1* 4,003.8*   Basic Metabolic Panel: Recent Labs  Lab 04/06/19 0014  NA 133*  K 4.0  CL 100  CO2 24  GLUCOSE 121*  BUN 17  CREATININE 0.75  CALCIUM 8.9   Liver Function Tests: Recent Labs  Lab 04/06/19 0014  AST 15  ALT 9  ALKPHOS 42  BILITOT 0.5  PROT 6.0*  ALBUMIN 3.3*   No results for input(s): LIPASE, AMYLASE in the last 168 hours. No results for input(s): AMMONIA in the last 168 hours. CBC: Recent Labs  Lab 04/05/19 2154  WBC 8.1  NEUTROABS 6.0  HGB 9.0*  HCT 27.2*  MCV 93.2  PLT 330   Cardiac Enzymes: No results for input(s): CKTOTAL, CKMB, CKMBINDEX, TROPONINI in the last 168 hours. BNP: Invalid input(s): POCBNP CBG: Recent Labs  Lab 04/06/19 1413 04/07/19 1420  GLUCAP 132* 123*   D-Dimer No results for input(s): DDIMER in the last 72 hours. Hgb A1c No results for input(s): HGBA1C in the last 72 hours. Lipid Profile No results for input(s): CHOL, HDL, LDLCALC, TRIG, CHOLHDL, LDLDIRECT in the last 72 hours. Thyroid function studies No results for input(s): TSH, T4TOTAL, T3FREE, THYROIDAB in the last 72 hours.  Invalid input(s): FREET3 Anemia work up No results for input(s): VITAMINB12, FOLATE, FERRITIN, TIBC, IRON, RETICCTPCT in the last 72 hours. Urinalysis     Component Value Date/Time   COLORURINE YELLOW 04/05/2019 0058   APPEARANCEUR CLEAR 04/05/2019 0058   LABSPEC 1.009 04/05/2019 0058   PHURINE 7.0 04/05/2019 0058   GLUCOSEU NEGATIVE 04/05/2019 0058   HGBUR NEGATIVE 04/05/2019 0058   BILIRUBINUR NEGATIVE 04/05/2019 0058   BILIRUBINUR Negative 01/13/2017 1234   KETONESUR NEGATIVE 04/05/2019 0058   PROTEINUR NEGATIVE 04/05/2019 0058   UROBILINOGEN 0.2 01/13/2017 1234   UROBILINOGEN 1.0 09/11/2015 0135   NITRITE NEGATIVE 04/05/2019 0058   LEUKOCYTESUR TRACE (A) 04/05/2019 0058   Sepsis Labs Invalid input(s): PROCALCITONIN,  WBC,  LACTICIDVEN Microbiology Recent Results (from the past 240 hour(s))  SARS Coronavirus 2 (CEPHEID - Performed in [facility] hospital lab), Hosp Order     Status: None   Collection Time: 04/05/19  9:50 PM  Result Value Ref Range Status   SARS Coronavirus 2 NEGATIVE NEGATIVE Final    Comment: (NOTE) If result is NEGATIVE SARS-CoV-2 target nucleic acids are NOT DETECTED. The SARS-CoV-2 RNA is generally detectable in upper and lower  respiratory specimens during the acute phase of infection. The lowest  concentration of SARS-CoV-2 viral copies this assay can detect is 250  copies / mL. A negative result does not preclude SARS-CoV-2 infection  and should not be used as the sole basis for treatment or other  patient management decisions.  A negative result may occur with  improper specimen collection / handling, submission of specimen other  than nasopharyngeal swab, presence of viral mutation(s) within the  areas targeted by this assay, and inadequate number of viral copies  (<250 copies / mL). A negative result must be combined with clinical  observations, patient history, and epidemiological information. If result is POSITIVE SARS-CoV-2 target nucleic acids are DETECTED. The SARS-CoV-2 RNA is generally detectable in upper and lower  respiratory  specimens dur ing the acute phase of infection.  Positive   results are indicative of active infection with SARS-CoV-2.  Clinical  correlation with patient history and other diagnostic information is  necessary to determine patient infection status.  Positive results do  not rule out bacterial infection or co-infection with other viruses. If result is PRESUMPTIVE POSTIVE SARS-CoV-2 nucleic acids MAY BE PRESENT.   A presumptive positive result was obtained on the submitted specimen  and confirmed on repeat testing.  While 2019 novel coronavirus  (SARS-CoV-2) nucleic acids may be present in the submitted sample  additional confirmatory testing may be necessary for epidemiological  and / or clinical management purposes  to differentiate between  SARS-CoV-2 and other Sarbecovirus currently known to infect humans.  If clinically indicated additional testing with an alternate test  methodology [phone]([id number]) is advised. The SARS-CoV-2 RNA is generally  detectable in upper and lower respiratory sp ecimens during the acute  phase of infection. The expected result is Negative. Fact Sheet for Patients:  BoilerBrush.com.cyhttps://www.fda.gov/media/136312/download Fact Sheet for Healthcare Providers: https://pope.com/https://www.fda.gov/media/136313/download This test is not yet approved or cleared by the [place] FDA and has been authorized for detection and/or diagnosis of SARS-CoV-2 by FDA under an Emergency Use Authorization (EUA).  This EUA will remain in effect (meaning this test can be used) for the duration of the COVID-19 declaration under Section 564(b)(1) of the Act, 21 U.S.C. section 360bbb-3(b)(1), unless the authorization is terminated or revoked sooner. Performed at [facility], [address] [place], [place] [address]      Time coordinating discharge: 32 minutes  SIGNED:   Kathlen ModyVijaya Srijan Givan, MD  [facility] 04/12/2019, 2:11 PM Pager   If 7PM-7AM, please contact night-coverage [email or web address] Password [email or web address]

## 2019-04-13 DIAGNOSIS — M109 Gout, unspecified: Secondary | ICD-10-CM | POA: Diagnosis not present

## 2019-04-13 DIAGNOSIS — G9340 Encephalopathy, unspecified: Secondary | ICD-10-CM | POA: Diagnosis not present

## 2019-04-13 DIAGNOSIS — M6281 Muscle weakness (generalized): Secondary | ICD-10-CM | POA: Diagnosis not present

## 2019-04-13 DIAGNOSIS — M069 Rheumatoid arthritis, unspecified: Secondary | ICD-10-CM | POA: Diagnosis not present

## 2019-04-13 DIAGNOSIS — R2689 Other abnormalities of gait and mobility: Secondary | ICD-10-CM | POA: Diagnosis not present

## 2019-04-13 DIAGNOSIS — G9341 Metabolic encephalopathy: Secondary | ICD-10-CM | POA: Diagnosis not present

## 2019-04-13 DIAGNOSIS — R1312 Dysphagia, oropharyngeal phase: Secondary | ICD-10-CM | POA: Diagnosis not present

## 2019-04-13 DIAGNOSIS — R52 Pain, unspecified: Secondary | ICD-10-CM | POA: Diagnosis not present

## 2019-04-13 DIAGNOSIS — E559 Vitamin D deficiency, unspecified: Secondary | ICD-10-CM | POA: Diagnosis not present

## 2019-04-13 DIAGNOSIS — D638 Anemia in other chronic diseases classified elsewhere: Secondary | ICD-10-CM | POA: Diagnosis not present

## 2019-04-13 DIAGNOSIS — R279 Unspecified lack of coordination: Secondary | ICD-10-CM | POA: Diagnosis not present

## 2019-04-13 DIAGNOSIS — E1169 Type 2 diabetes mellitus with other specified complication: Secondary | ICD-10-CM | POA: Diagnosis not present

## 2019-04-13 NOTE — Telephone Encounter (Signed)
Ok to order 

## 2019-04-13 NOTE — Telephone Encounter (Signed)
Pt daughter called and wanted Dr Earlene Plater to know that that patient is doing well and feeling much better. Pt daughter would also like to know if an new for a hospital bed from [facility]. Please advise    [phone]

## 2019-04-13 NOTE — Telephone Encounter (Signed)
Okay for order?

## 2019-04-13 NOTE — Telephone Encounter (Signed)
See note

## 2019-04-14 DIAGNOSIS — R1312 Dysphagia, oropharyngeal phase: Secondary | ICD-10-CM | POA: Diagnosis not present

## 2019-04-14 DIAGNOSIS — R2689 Other abnormalities of gait and mobility: Secondary | ICD-10-CM | POA: Diagnosis not present

## 2019-04-14 DIAGNOSIS — G9340 Encephalopathy, unspecified: Secondary | ICD-10-CM | POA: Diagnosis not present

## 2019-04-14 DIAGNOSIS — R279 Unspecified lack of coordination: Secondary | ICD-10-CM | POA: Diagnosis not present

## 2019-04-14 DIAGNOSIS — M6281 Muscle weakness (generalized): Secondary | ICD-10-CM | POA: Diagnosis not present

## 2019-04-14 DIAGNOSIS — G9341 Metabolic encephalopathy: Secondary | ICD-10-CM | POA: Diagnosis not present

## 2019-04-14 NOTE — Telephone Encounter (Signed)
Order and requesting info faxed to [facility]. Called [place] and advised.

## 2019-04-17 DIAGNOSIS — G9340 Encephalopathy, unspecified: Secondary | ICD-10-CM | POA: Diagnosis not present

## 2019-04-17 DIAGNOSIS — M6281 Muscle weakness (generalized): Secondary | ICD-10-CM | POA: Diagnosis not present

## 2019-04-17 DIAGNOSIS — R2689 Other abnormalities of gait and mobility: Secondary | ICD-10-CM | POA: Diagnosis not present

## 2019-04-17 DIAGNOSIS — R279 Unspecified lack of coordination: Secondary | ICD-10-CM | POA: Diagnosis not present

## 2019-04-17 DIAGNOSIS — R1312 Dysphagia, oropharyngeal phase: Secondary | ICD-10-CM | POA: Diagnosis not present

## 2019-04-17 DIAGNOSIS — G9341 Metabolic encephalopathy: Secondary | ICD-10-CM | POA: Diagnosis not present

## 2019-04-18 ENCOUNTER — Telehealth: Payer: Self-pay | Admitting: *Deleted

## 2019-04-18 DIAGNOSIS — R1312 Dysphagia, oropharyngeal phase: Secondary | ICD-10-CM | POA: Diagnosis not present

## 2019-04-18 DIAGNOSIS — R2689 Other abnormalities of gait and mobility: Secondary | ICD-10-CM | POA: Diagnosis not present

## 2019-04-18 DIAGNOSIS — R279 Unspecified lack of coordination: Secondary | ICD-10-CM | POA: Diagnosis not present

## 2019-04-18 DIAGNOSIS — M6281 Muscle weakness (generalized): Secondary | ICD-10-CM | POA: Diagnosis not present

## 2019-04-18 DIAGNOSIS — G9341 Metabolic encephalopathy: Secondary | ICD-10-CM | POA: Diagnosis not present

## 2019-04-18 DIAGNOSIS — G9340 Encephalopathy, unspecified: Secondary | ICD-10-CM | POA: Diagnosis not present

## 2019-04-18 NOTE — Patient Outreach (Signed)
New referral for high risk [employer] patient. She has been placed at [facility] for rehab. I will send notification for a LCSW to monitor while in rehab and notify me when she is discharged.  [name] [name]. [name] Neither, MSN, [facility] Gerontological Nurse Practitioner [facility] Care Management [phone]

## 2019-04-19 DIAGNOSIS — D649 Anemia, unspecified: Secondary | ICD-10-CM | POA: Diagnosis not present

## 2019-04-19 DIAGNOSIS — R279 Unspecified lack of coordination: Secondary | ICD-10-CM | POA: Diagnosis not present

## 2019-04-19 DIAGNOSIS — R2689 Other abnormalities of gait and mobility: Secondary | ICD-10-CM | POA: Diagnosis not present

## 2019-04-19 DIAGNOSIS — G9341 Metabolic encephalopathy: Secondary | ICD-10-CM | POA: Diagnosis not present

## 2019-04-19 DIAGNOSIS — E119 Type 2 diabetes mellitus without complications: Secondary | ICD-10-CM | POA: Diagnosis not present

## 2019-04-19 DIAGNOSIS — R1312 Dysphagia, oropharyngeal phase: Secondary | ICD-10-CM | POA: Diagnosis not present

## 2019-04-19 DIAGNOSIS — G9340 Encephalopathy, unspecified: Secondary | ICD-10-CM | POA: Diagnosis not present

## 2019-04-19 DIAGNOSIS — N183 Chronic kidney disease, stage 3 (moderate): Secondary | ICD-10-CM | POA: Diagnosis not present

## 2019-04-19 DIAGNOSIS — E871 Hypo-osmolality and hyponatremia: Secondary | ICD-10-CM | POA: Diagnosis not present

## 2019-04-19 DIAGNOSIS — M6281 Muscle weakness (generalized): Secondary | ICD-10-CM | POA: Diagnosis not present

## 2019-04-20 ENCOUNTER — Encounter: Payer: Self-pay | Admitting: *Deleted

## 2019-04-20 ENCOUNTER — Other Ambulatory Visit: Payer: Self-pay | Admitting: *Deleted

## 2019-04-20 DIAGNOSIS — G9341 Metabolic encephalopathy: Secondary | ICD-10-CM | POA: Diagnosis not present

## 2019-04-20 DIAGNOSIS — M6281 Muscle weakness (generalized): Secondary | ICD-10-CM | POA: Diagnosis not present

## 2019-04-20 DIAGNOSIS — G9340 Encephalopathy, unspecified: Secondary | ICD-10-CM | POA: Diagnosis not present

## 2019-04-20 DIAGNOSIS — R1312 Dysphagia, oropharyngeal phase: Secondary | ICD-10-CM | POA: Diagnosis not present

## 2019-04-20 DIAGNOSIS — R279 Unspecified lack of coordination: Secondary | ICD-10-CM | POA: Diagnosis not present

## 2019-04-20 DIAGNOSIS — R2689 Other abnormalities of gait and mobility: Secondary | ICD-10-CM | POA: Diagnosis not present

## 2019-04-20 NOTE — Patient Outreach (Signed)
[facility] Care Management  04/20/2019  ILEAH FALKENSTEIN 10-Dec-1929 [id number]   CSW made an attempt to try and contact patient today to perform the initial phone assessment, as well as assess and assist with social work needs and services, without success.  A HIPAA compliant message was left for patient on voicemail.  CSW also left a HIPAA compliant message for patient with the receptionist at [facility] [place] where patient currently reside to receive short-term rehabilitative services.  CSW is currently awaiting a return call.  CSW will make a second outreach attempt within the next 3-4 business days, if a return call is not received from patient in the meantime.  CSW will also mail an Outreach Letter to patient's home requesting that patient contact CSW if patient is interested in receiving social work services through [place] with Scientist, clinical (histocompatibility and immunogenetics).  [name], BSW, MSW, LCSW  Licensed Education officer, [facility]  Mailing [place] [address], [place], [place] [address] Physical Address-[address], [place], [place] [address] Toll Free Main # [phone] Fax # [phone] Cell # [phone]  Office # [phone] [name]

## 2019-04-21 DIAGNOSIS — G9340 Encephalopathy, unspecified: Secondary | ICD-10-CM | POA: Diagnosis not present

## 2019-04-21 DIAGNOSIS — R279 Unspecified lack of coordination: Secondary | ICD-10-CM | POA: Diagnosis not present

## 2019-04-21 DIAGNOSIS — M6281 Muscle weakness (generalized): Secondary | ICD-10-CM | POA: Diagnosis not present

## 2019-04-21 DIAGNOSIS — G9341 Metabolic encephalopathy: Secondary | ICD-10-CM | POA: Diagnosis not present

## 2019-04-21 DIAGNOSIS — R1312 Dysphagia, oropharyngeal phase: Secondary | ICD-10-CM | POA: Diagnosis not present

## 2019-04-21 DIAGNOSIS — R2689 Other abnormalities of gait and mobility: Secondary | ICD-10-CM | POA: Diagnosis not present

## 2019-04-24 ENCOUNTER — Ambulatory Visit: Payer: Medicare Other | Admitting: Cardiology

## 2019-04-24 ENCOUNTER — Telehealth (INDEPENDENT_AMBULATORY_CARE_PROVIDER_SITE_OTHER): Payer: Medicare Other | Admitting: Cardiology

## 2019-04-24 ENCOUNTER — Telehealth: Payer: Self-pay

## 2019-04-24 DIAGNOSIS — N183 Chronic kidney disease, stage 3 unspecified: Secondary | ICD-10-CM

## 2019-04-24 DIAGNOSIS — Z8673 Personal history of transient ischemic attack (TIA), and cerebral infarction without residual deficits: Secondary | ICD-10-CM | POA: Diagnosis not present

## 2019-04-24 DIAGNOSIS — I5042 Chronic combined systolic (congestive) and diastolic (congestive) heart failure: Secondary | ICD-10-CM

## 2019-04-24 DIAGNOSIS — E1122 Type 2 diabetes mellitus with diabetic chronic kidney disease: Secondary | ICD-10-CM | POA: Diagnosis not present

## 2019-04-24 DIAGNOSIS — I420 Dilated cardiomyopathy: Secondary | ICD-10-CM | POA: Diagnosis not present

## 2019-04-24 DIAGNOSIS — R2689 Other abnormalities of gait and mobility: Secondary | ICD-10-CM | POA: Diagnosis not present

## 2019-04-24 DIAGNOSIS — M6281 Muscle weakness (generalized): Secondary | ICD-10-CM | POA: Diagnosis not present

## 2019-04-24 DIAGNOSIS — R400 Somnolence: Secondary | ICD-10-CM

## 2019-04-24 DIAGNOSIS — G9341 Metabolic encephalopathy: Secondary | ICD-10-CM | POA: Diagnosis not present

## 2019-04-24 DIAGNOSIS — R1312 Dysphagia, oropharyngeal phase: Secondary | ICD-10-CM | POA: Diagnosis not present

## 2019-04-24 DIAGNOSIS — R279 Unspecified lack of coordination: Secondary | ICD-10-CM | POA: Diagnosis not present

## 2019-04-24 DIAGNOSIS — G9340 Encephalopathy, unspecified: Secondary | ICD-10-CM | POA: Diagnosis not present

## 2019-04-24 NOTE — Telephone Encounter (Signed)
Spoke with nurse, Marylin Crosby at facility and gave her [facility] recommendations and instructions. She voiced understanding. Gave her our fax number for her to fax over patients vitals and recent lab work. She voiced understanding.

## 2019-04-24 NOTE — Telephone Encounter (Signed)
VERBAL CONSENT GIVEN [name] [name] ON 03/07/2019.     Virtual Visit Pre-Appointment Phone Call  "[name] [name], I am calling you today to discuss your upcoming appointment. We are currently trying to limit exposure to the virus that causes COVID-19 by seeing patients at home rather than in the office."  1. "What is the BEST phone number to call the day of the visit?" - include this in appointment notes  2. "Do you have or have access to (through a family member/friend) a smartphone with video capability that we can use for your visit?" a. If yes - list this number in appt notes as "cell" (if different from BEST phone #) and list the appointment type as a VIDEO visit in appointment notes b. If no - list the appointment type as a PHONE visit in appointment notes  3. Confirm consent - "In the setting of the current Covid19 crisis, you are scheduled for a (phone or video) visit with your provider on (date) at (time).  Just as we do with many in-office visits, in order for you to participate in this visit, we must obtain consent.  If you'd like, I can send this to your mychart (if signed up) or email for you to review.  Otherwise, I can obtain your verbal consent now.  All virtual visits are billed to your insurance company just like a normal visit would be.  By agreeing to a virtual visit, we'd like you to understand that the technology does not allow for your provider to perform an examination, and thus may limit your provider's ability to fully assess your condition. If your provider identifies any concerns that need to be evaluated in person, we will make arrangements to do so.  Finally, though the technology is pretty good, we cannot assure that it will always work on either your or our end, and in the setting of a video visit, we may have to convert it to a phone-only visit.  In either situation, we cannot ensure that we have a secure connection.  Are you willing to proceed?" STAFF: Did the patient  verbally acknowledge consent to telehealth visit? Document YES/NO here: YES  4. Advise patient to be prepared - "Two hours prior to your appointment, go ahead and check your blood pressure, pulse, oxygen saturation, and your weight (if you have the equipment to check those) and write them all down. When your visit starts, your provider will ask you for this information. If you have an Apple Watch or Kardia device, please plan to have heart rate information ready on the day of your appointment. Please have a pen and paper handy nearby the day of the visit as well."  5. Give patient instructions for MyChart download to smartphone OR Doximity/Doxy.me as below if video visit (depending on what platform provider is using)  6. Inform patient they will receive a phone call 15 minutes prior to their appointment time (may be from unknown caller ID) so they should be prepared to answer    TELEPHONE CALL NOTE  [name] [name] has been deemed a candidate for a follow-up tele-health visit to limit community exposure during the Covid-19 pandemic. I spoke with the patient via phone to ensure availability of phone/video source, confirm preferred email & phone number, and discuss instructions and expectations.  I reminded [name] [name] to be prepared with any vital sign and/or heart rhythm information that could potentially be obtained via home monitoring, at the time of her visit. I reminded  [name] [name] to expect a phone call prior to her visit.  Lucita Ferrara, CMA 04/24/2019 3:18 PM   INSTRUCTIONS FOR DOWNLOADING THE MYCHART APP TO SMARTPHONE  - The patient must first make sure to have activated MyChart and know their login information - If Apple, go to [employer] and type in MyChart in the search bar and download the app. If Android, ask patient to go to [employer] and type in [place] in the search bar and download the app. The app is free but as with any other app downloads, their phone may  require them to verify saved payment information or Apple/Android password.  - The patient will need to then log into the app with their MyChart username and password, and select [facility] as their healthcare provider to link the account. When it is time for your visit, go to the MyChart app, find appointments, and click Begin Video Visit. Be sure to Select Allow for your device to access the Microphone and Camera for your visit. You will then be connected, and your provider will be with you shortly.  **If they have any issues connecting, or need assistance please contact MyChart service desk [phone])**  **If using a computer, in order to ensure the best quality for their visit they will need to use either of the following Internet Browsers: [employer] or Google Chrome**  IF USING DOXIMITY or DOXY.ME - The patient will receive a link just prior to their visit by text.     FULL LENGTH CONSENT FOR TELE-HEALTH VISIT   I hereby voluntarily request, consent and authorize [facility] and its employed or contracted physicians, physician assistants, nurse practitioners or other licensed health care professionals (the Practitioner), to provide me with telemedicine health care services (the "Services") as deemed necessary by the treating Practitioner. I acknowledge and consent to receive the Services by the Practitioner via telemedicine. I understand that the telemedicine visit will involve communicating with the Practitioner through live audiovisual communication technology and the disclosure of certain medical information by electronic transmission. I acknowledge that I have been given the opportunity to request an in-person assessment or other available alternative prior to the telemedicine visit and am voluntarily participating in the telemedicine visit.  I understand that I have the right to withhold or withdraw my consent to the use of telemedicine in the course of my care at  any time, without affecting my right to future care or treatment, and that the Practitioner or I may terminate the telemedicine visit at any time. I understand that I have the right to inspect all information obtained and/or recorded in the course of the telemedicine visit and may receive copies of available information for a reasonable fee.  I understand that some of the potential risks of receiving the Services via telemedicine include:  Marland Kitchen Delay or interruption in medical evaluation due to technological equipment failure or disruption; . Information transmitted may not be sufficient (e.g. poor resolution of images) to allow for appropriate medical decision making by the Practitioner; and/or  . In rare instances, security protocols could fail, causing a breach of personal health information.  Furthermore, I acknowledge that it is my responsibility to provide information about my medical history, conditions and care that is complete and accurate to the best of my ability. I acknowledge that Practitioner's advice, recommendations, and/or decision may be based on factors not within their control, such as incomplete or inaccurate data provided by me or distortions of  diagnostic images or specimens that may result from electronic transmissions. I understand that the practice of medicine is not an exact science and that Practitioner makes no warranties or guarantees regarding treatment outcomes. I acknowledge that I will receive a copy of this consent concurrently upon execution via email to the email address I last provided but may also request a printed copy by calling the office of [facility].    I understand that my insurance will be billed for this visit.   I have read or had this consent read to me. . I understand the contents of this consent, which adequately explains the benefits and risks of the Services being provided via telemedicine.  . I have been provided ample opportunity to ask questions  regarding this consent and the Services and have had my questions answered to my satisfaction. . I give my informed consent for the services to be provided through the use of telemedicine in my medical care  By participating in this telemedicine visit I agree to the above.

## 2019-04-24 NOTE — Progress Notes (Signed)
Virtual Visit via Telephone Note   This visit type was conducted due to national recommendations for restrictions regarding the COVID-19 Pandemic (e.g. social distancing) in an effort to limit this patient's exposure and mitigate transmission in our community.  Due to her co-morbid illnesses, this patient is at least at moderate risk for complications without adequate follow up.  This format is felt to be most appropriate for this patient at this time.  The patient did not have access to video technology/had technical difficulties with video requiring transitioning to audio format only (telephone).  All issues noted in this document were discussed and addressed.  No physical exam could be performed with this format.  Please refer to the patient's chart for her  consent to telehealth for [facility].   Date:  04/24/2019   ID:  [name] [name], DOB [birth year], MRN [id number]  Patient Location: [place] Provider Location: Office  PCP:  Briscoe Deutscher, DO  Cardiologist:  Skeet Latch, MD  Electrophysiologist:  None   Evaluation Performed:  Follow-Up Visit  Chief Complaint:  none  History of Present Illness:    [name] [name] is a 85 y.o. female with a history of congestive heart failure, rheumatoid arthritis on steroids, type II non-insulin-dependent diabetes, and prior stroke.  In July 2018 she had an ejection fraction of 20 to 25% by echo.  Cardiac catheterization showed no significant coronary disease.  She was treated medically and a follow-up echocardiogram in October 2019 showed her ejection fraction to be 60 to 65%.  In September 2019 she had a pulmonary embolism and was placed on Xarelto.  Dr. Oval Linsey saw her Mar 17, 2019.  She was doing well at that time, she was seen as a preop clearance.  From a cardiac standpoint she was cleared for hand surgery though it was recommended that she be crossed over with Lovenox when she came off Xarelto.  A week or so later the  patient was admitted to the hospital May 20 to May 25 with mental status changes presumed to be secondary to polypharmacy and hyponatremia.  She was discharged on the 25th but came back to the emergency room on May 28, brought in by the family requesting skilled nursing placement, the patient had previously declined this.  On 529 she was placed in a skilled facility.  I contacted that facility today and spoke with the patient as well as [name] [name] and who was the Editor, commissioning there.  The patient tells me she has been doing well and had no complaints.  VN will fax over her recent vital signs, medication list and labs.  The patient does not have symptoms concerning for COVID-19 infection (fever, chills, cough, or new shortness of breath).    Past Medical History:  Diagnosis Date   Allergic rhinitis due to pollen 04/27/2007   Arthritis    "in q joint" (08/07/2013)   CHF (congestive heart failure) (HCC)    Coronary atherosclerosis of native coronary artery    Cough    Edema 05/03/2013   Exertional shortness of breath    "sometimes" (08/07/2013)   First degree atrioventricular block    Gout, unspecified 04/19/2013   Hyperlipidemia 04/27/2007   Hypertension    Midline low back pain without sciatica 06/27/2014   Muscle spasm    Muscle weakness (generalized)    Onychia and paronychia of toe    Osteoarthrosis, unspecified whether generalized or localized, unspecified site 04/27/2007   Other fall    Other  malaise and fatigue    Pneumonia 05/2018   Prepatellar bursitis    Pulmonary embolism (HCC) 07/2018   Rheumatic nodule 06/28/2017   Seizures (HCC)    Stroke (HCC) 01/17/2014   Type II diabetes mellitus (HCC)    "fasting 90-110s" (08/07/2013)   Unspecified vitamin D deficiency    Past Surgical History:  Procedure Laterality Date   ABDOMINAL HYSTERECTOMY  04/1980   APPENDECTOMY  1960   CARDIAC CATHETERIZATION  2003   CATARACT EXTRACTION W/ INTRAOCULAR  LENS  IMPLANT, BILATERAL Bilateral ~ 2012   CHOLECYSTECTOMY N/A 06/26/2018   Procedure: LAPAROSCOPIC CHOLECYSTECTOMY POSSIBLE INTRAOPERATIVE CHOLANGIOGRAM;  Surgeon: Abigail Miyamoto, MD;  Location: [facility] OR;  Service: General;  Laterality: N/A;   JOINT REPLACEMENT     REPLACEMENT TOTAL KNEE Right 09/2005   RIGHT/LEFT HEART CATH AND CORONARY ANGIOGRAPHY N/A 06/01/2018   Procedure: RIGHT/LEFT HEART CATH AND CORONARY ANGIOGRAPHY;  Surgeon: Corky Crafts, MD;  Location: [facility] INVASIVE CV LAB;  Service: Cardiovascular;  Laterality: N/A;   SHOULDER OPEN ROTATOR CUFF REPAIR Right 07/1999   TONSILLECTOMY  08/1974   TOTAL KNEE ARTHROPLASTY Left 08/07/2013   Procedure: TOTAL KNEE ARTHROPLASTY- LEFT;  Surgeon: Dannielle Huh, MD;  Location: [facility] OR;  Service: Orthopedics;  Laterality: Left;   TRANSTHORACIC ECHOCARDIOGRAM  2003   EF 55-65%; mild concentric LVH     No outpatient medications have been marked as taking for the 04/24/19 encounter (Appointment) with Abelino Derrick, PA-C.     Allergies:   Clonidine derivatives, Fish allergy, Shellfish allergy, Doxycycline, Indomethacin, Lyrica [pregabalin], Methyldopa, Morphine and related, Orange fruit [citrus], Strawberry (diagnostic), Cetirizine hcl, Codeine, Levaquin [levofloxacin in d5w], and Tomato   Social History   Tobacco Use   Smoking status: Never Smoker   Smokeless tobacco: Never Used  Substance Use Topics   Alcohol use: No    Alcohol/week: 0.0 standard drinks   Drug use: No     Family Hx: The patient's family history includes Heart attack in her father.  ROS:   Please see the history of present illness.     All other systems reviewed and are negative.   Prior CV studies:   The following studies were reviewed today:   Labs/Other Tests and Data Reviewed:    EKG:  No ECG reviewed.  Recent Labs: 01/12/2019: Magnesium 1.9 03/29/2019: TSH 1.521 04/05/2019: Hemoglobin 9.0; Platelets 330 04/06/2019: ALT 9; B Natriuretic Peptide  4,003.8; BUN 17; Creatinine, Ser 0.75; Potassium 4.0; Sodium 133   Recent Lipid Panel Lab Results  Component Value Date/Time   CHOL 202 (H) 06/25/2018 06:07 AM   CHOL 185 03/04/2016 04:20 PM   TRIG 67 06/25/2018 06:07 AM   HDL 63 06/25/2018 06:07 AM   HDL 73 03/04/2016 04:20 PM   CHOLHDL 3.2 06/25/2018 06:07 AM   LDLCALC 126 (H) 06/25/2018 06:07 AM   LDLCALC 89 03/04/2016 04:20 PM    Wt Readings from Last 3 Encounters:  04/05/19 173 lb 8 oz (78.7 kg)  04/03/19 173 lb 8 oz (78.7 kg)  03/17/19 178 lb 6.4 oz (80.9 kg)     Objective:    Vital Signs:  There were no vitals taken for this visit.   VITAL SIGNS:  reviewed  ASSESSMENT & PLAN:    NICM- EF 20-25% July 2019, 60-65% Oct 2019 Non obstructive CAD July 2019  H/O PE- PE diagnosed Sept 2019- Xarelto  RA- Chronic steroids    COVID-19 Education: The signs and symptoms of COVID-19 were discussed with the patient and how  to seek care for testing (follow up with PCP or arrange E-visit).  The importance of social distancing was discussed today.  Time:   Today, I have spent 15 minutes with the patient with telehealth technology discussing the above problems.     Medication Adjustments/Labs and Tests Ordered: Current medicines are reviewed at length with the patient today.  Concerns regarding medicines are outlined above.   Tests Ordered: No orders of the defined types were placed in this encounter.   Medication Changes: No orders of the defined types were placed in this encounter.   Follow Up:  Virtual Visit or In Person Dr Duke Salvia in Oct.  Facility to send [place] recent labs and vitals for review.   Jolene Provost, PA-C  04/24/2019 3:38 PM    [facility]

## 2019-04-24 NOTE — Patient Instructions (Addendum)
Medication Instructions:  Your physician recommends that you continue on your current medications as directed. Please refer to the Current Medication list given to you today.  If you need a refill on your cardiac medications before your next appointment, please call your pharmacy.   Lab work: None  If you have labs (blood work) drawn today and your tests are completely normal, you will receive your results only by: Marland Kitchen MyChart Message (if you have MyChart) OR . A paper copy in the mail If you have any lab test that is abnormal or we need to change your treatment, we will call you to review the results.  Testing/Procedures: None   Follow-Up: At [facility], you and your health needs are our priority.  As part of our continuing mission to provide you with exceptional heart care, we have created designated Provider Care Teams.  These Care Teams include your primary Cardiologist (physician) and Advanced Practice Providers (APPs -  Physician Assistants and Nurse Practitioners) who all work together to provide you with the care you need, when you need it. You will need a follow up appointment in 4 months.  Please call our office 2 months in advance to schedule this appointment.  You may see Skeet Latch, MD or one of the following Advanced Practice Providers on your designated Care Team:   Kerin Ransom, PA-C Roby Lofts, [place] . Sande Rives, PA-C  Any Other Special Instructions Will Be Listed Below (If Applicable). Fax over vitals and lab work

## 2019-04-25 DIAGNOSIS — G9341 Metabolic encephalopathy: Secondary | ICD-10-CM | POA: Diagnosis not present

## 2019-04-25 DIAGNOSIS — R1312 Dysphagia, oropharyngeal phase: Secondary | ICD-10-CM | POA: Diagnosis not present

## 2019-04-25 DIAGNOSIS — R279 Unspecified lack of coordination: Secondary | ICD-10-CM | POA: Diagnosis not present

## 2019-04-25 DIAGNOSIS — G9340 Encephalopathy, unspecified: Secondary | ICD-10-CM | POA: Diagnosis not present

## 2019-04-25 DIAGNOSIS — R2689 Other abnormalities of gait and mobility: Secondary | ICD-10-CM | POA: Diagnosis not present

## 2019-04-25 DIAGNOSIS — M069 Rheumatoid arthritis, unspecified: Secondary | ICD-10-CM | POA: Diagnosis not present

## 2019-04-25 DIAGNOSIS — M6281 Muscle weakness (generalized): Secondary | ICD-10-CM | POA: Diagnosis not present

## 2019-04-26 ENCOUNTER — Other Ambulatory Visit: Payer: Self-pay | Admitting: *Deleted

## 2019-04-26 DIAGNOSIS — G9341 Metabolic encephalopathy: Secondary | ICD-10-CM | POA: Diagnosis not present

## 2019-04-26 DIAGNOSIS — R279 Unspecified lack of coordination: Secondary | ICD-10-CM | POA: Diagnosis not present

## 2019-04-26 DIAGNOSIS — R2689 Other abnormalities of gait and mobility: Secondary | ICD-10-CM | POA: Diagnosis not present

## 2019-04-26 DIAGNOSIS — G9340 Encephalopathy, unspecified: Secondary | ICD-10-CM | POA: Diagnosis not present

## 2019-04-26 DIAGNOSIS — R1312 Dysphagia, oropharyngeal phase: Secondary | ICD-10-CM | POA: Diagnosis not present

## 2019-04-26 DIAGNOSIS — M6281 Muscle weakness (generalized): Secondary | ICD-10-CM | POA: Diagnosis not present

## 2019-04-26 NOTE — Patient Outreach (Signed)
[facility]) Care Management  04/26/2019  [name] [name] Dec 22, 1929 [id number]   CSW made a second attempt to try and contact patient today to perform the initial phone assessment, as well as assess and assist with social work needs and services, without success.  A HIPAA compliant message was left for patient on voicemail.  CSW also left a HIPAA compliant message for patient with the receptionist at [facility] [place] where patient currently reside to receive short-term rehabilitative services.  CSW continues to await a return call.  CSW will make a third and final outreach attempt within the next 3-4 business days, if a return call is not received from patient in the meantime.  CSW will then proceed with case closure if a return call is not received from patient with a total of 10 business days, as required number of phone attempts will have been made and outreach letter mailed.   [name], BSW, MSW, LCSW  Licensed Education officer, environmental Health System  Mailing [place] [address] [place], [place] [address] Physical Address-[address], [place], [place] [address] Toll Free Main # [phone] Fax # [phone] Cell # [phone]  Office # [phone] [name]

## 2019-04-27 DIAGNOSIS — R1312 Dysphagia, oropharyngeal phase: Secondary | ICD-10-CM | POA: Diagnosis not present

## 2019-04-27 DIAGNOSIS — E871 Hypo-osmolality and hyponatremia: Secondary | ICD-10-CM | POA: Diagnosis not present

## 2019-04-27 DIAGNOSIS — M6281 Muscle weakness (generalized): Secondary | ICD-10-CM | POA: Diagnosis not present

## 2019-04-27 DIAGNOSIS — G9340 Encephalopathy, unspecified: Secondary | ICD-10-CM | POA: Diagnosis not present

## 2019-04-27 DIAGNOSIS — R279 Unspecified lack of coordination: Secondary | ICD-10-CM | POA: Diagnosis not present

## 2019-04-27 DIAGNOSIS — G9341 Metabolic encephalopathy: Secondary | ICD-10-CM | POA: Diagnosis not present

## 2019-04-27 DIAGNOSIS — R2689 Other abnormalities of gait and mobility: Secondary | ICD-10-CM | POA: Diagnosis not present

## 2019-04-28 DIAGNOSIS — M6281 Muscle weakness (generalized): Secondary | ICD-10-CM | POA: Diagnosis not present

## 2019-04-28 DIAGNOSIS — R1312 Dysphagia, oropharyngeal phase: Secondary | ICD-10-CM | POA: Diagnosis not present

## 2019-04-28 DIAGNOSIS — G9340 Encephalopathy, unspecified: Secondary | ICD-10-CM | POA: Diagnosis not present

## 2019-04-28 DIAGNOSIS — R279 Unspecified lack of coordination: Secondary | ICD-10-CM | POA: Diagnosis not present

## 2019-04-28 DIAGNOSIS — G9341 Metabolic encephalopathy: Secondary | ICD-10-CM | POA: Diagnosis not present

## 2019-04-28 DIAGNOSIS — E871 Hypo-osmolality and hyponatremia: Secondary | ICD-10-CM | POA: Diagnosis not present

## 2019-04-28 DIAGNOSIS — I10 Essential (primary) hypertension: Secondary | ICD-10-CM | POA: Diagnosis not present

## 2019-04-28 DIAGNOSIS — I5042 Chronic combined systolic (congestive) and diastolic (congestive) heart failure: Secondary | ICD-10-CM | POA: Diagnosis not present

## 2019-04-28 DIAGNOSIS — R2689 Other abnormalities of gait and mobility: Secondary | ICD-10-CM | POA: Diagnosis not present

## 2019-04-28 DIAGNOSIS — N183 Chronic kidney disease, stage 3 (moderate): Secondary | ICD-10-CM | POA: Diagnosis not present

## 2019-04-30 DIAGNOSIS — R1312 Dysphagia, oropharyngeal phase: Secondary | ICD-10-CM | POA: Diagnosis not present

## 2019-04-30 DIAGNOSIS — G9340 Encephalopathy, unspecified: Secondary | ICD-10-CM | POA: Diagnosis not present

## 2019-04-30 DIAGNOSIS — R279 Unspecified lack of coordination: Secondary | ICD-10-CM | POA: Diagnosis not present

## 2019-04-30 DIAGNOSIS — M6281 Muscle weakness (generalized): Secondary | ICD-10-CM | POA: Diagnosis not present

## 2019-04-30 DIAGNOSIS — G9341 Metabolic encephalopathy: Secondary | ICD-10-CM | POA: Diagnosis not present

## 2019-04-30 DIAGNOSIS — R2689 Other abnormalities of gait and mobility: Secondary | ICD-10-CM | POA: Diagnosis not present

## 2019-05-01 DIAGNOSIS — G9341 Metabolic encephalopathy: Secondary | ICD-10-CM | POA: Diagnosis not present

## 2019-05-01 DIAGNOSIS — R2689 Other abnormalities of gait and mobility: Secondary | ICD-10-CM | POA: Diagnosis not present

## 2019-05-01 DIAGNOSIS — R1312 Dysphagia, oropharyngeal phase: Secondary | ICD-10-CM | POA: Diagnosis not present

## 2019-05-01 DIAGNOSIS — M6281 Muscle weakness (generalized): Secondary | ICD-10-CM | POA: Diagnosis not present

## 2019-05-01 DIAGNOSIS — G9340 Encephalopathy, unspecified: Secondary | ICD-10-CM | POA: Diagnosis not present

## 2019-05-01 DIAGNOSIS — R279 Unspecified lack of coordination: Secondary | ICD-10-CM | POA: Diagnosis not present

## 2019-05-02 ENCOUNTER — Encounter: Payer: Self-pay | Admitting: *Deleted

## 2019-05-02 ENCOUNTER — Other Ambulatory Visit: Payer: Self-pay | Admitting: *Deleted

## 2019-05-02 DIAGNOSIS — M6281 Muscle weakness (generalized): Secondary | ICD-10-CM | POA: Diagnosis not present

## 2019-05-02 DIAGNOSIS — M069 Rheumatoid arthritis, unspecified: Secondary | ICD-10-CM | POA: Diagnosis not present

## 2019-05-02 DIAGNOSIS — R2689 Other abnormalities of gait and mobility: Secondary | ICD-10-CM | POA: Diagnosis not present

## 2019-05-02 DIAGNOSIS — E119 Type 2 diabetes mellitus without complications: Secondary | ICD-10-CM | POA: Diagnosis not present

## 2019-05-02 DIAGNOSIS — G9341 Metabolic encephalopathy: Secondary | ICD-10-CM | POA: Diagnosis not present

## 2019-05-02 DIAGNOSIS — G9340 Encephalopathy, unspecified: Secondary | ICD-10-CM | POA: Diagnosis not present

## 2019-05-02 DIAGNOSIS — R279 Unspecified lack of coordination: Secondary | ICD-10-CM | POA: Diagnosis not present

## 2019-05-02 DIAGNOSIS — R531 Weakness: Secondary | ICD-10-CM | POA: Diagnosis not present

## 2019-05-02 DIAGNOSIS — R1312 Dysphagia, oropharyngeal phase: Secondary | ICD-10-CM | POA: Diagnosis not present

## 2019-05-02 DIAGNOSIS — M48061 Spinal stenosis, lumbar region without neurogenic claudication: Secondary | ICD-10-CM | POA: Diagnosis not present

## 2019-05-02 NOTE — Patient Outreach (Signed)
[name] [name]) Care Management  05/02/2019  [name] [name] April 13, 1930 [id number]  CSW made an attempt to try and contact patient today to perform the initial phone assessment, as well as assess and assist with social work needs and services; however, patient was unavailable at the time of CSW's call. CSW was able to speak with patient's daughter and [name] [name], [place], as she answered patient's cell phone.  CSW was able to perform the initial phone assessment on patient with Mrs. [name] [name], as well as assess and assist with social work needs and services.  CSW introduced self, explained role and types of services provided through [facility]).  CSW further explained to Mrs. [name] [name] that [facility] works with patient's Geriatric Designer, jewellery, also with [facility], [place]. CSW then explained the reason for the call, indicating that Ms. Spinks thought that patient would benefit from social work services and resources to assist with discharge planning needs and services from the skilled nursing facility where patient currently resides to receive short-term rehabilitative services.  CSW obtained two HIPAA compliant identifiers from Mrs. [name] [name], which included patient's name and date of birth.  Mrs. [name] [name] reported that patient is progressing extremely well with therapies (both physical and occupational) at [facility] [place] where patient currently resides to receive short-term rehabilitative services.  Mrs. [name] [name] indicated that patient does not have a tentative discharge date set at this time, but believes that patient will need to reside at the facility for an additional three weeks.  Mrs. [name] [name] went on to explain that patient will be returning home to live at time of discharge from the facility and that home health services will be resumed  through [facility] and personal care services will be resumed through [place] Forensic scientist) through the [place].  Mrs. [name] [name] stated that patient receives 24 hour care and supervision through personal care services, as well as various family members.  CSW agreed to follow-up with Mrs. [name] [name] again next week, on Tuesday, May 09, 2019, around 9:00AM, to assist with discharge planning needs and services from the nursing facility.  [name] [name], BSW, MSW, LCSW  Licensed Education officer, environmental Health System  Mailing [place] [address], [place], [place] [address] Physical Address-[address], [place], [place] [address] Toll Free Main # [phone] Fax # [phone] Cell # [phone]  Office # [phone] [name]

## 2019-05-04 DIAGNOSIS — G9340 Encephalopathy, unspecified: Secondary | ICD-10-CM | POA: Diagnosis not present

## 2019-05-04 DIAGNOSIS — R1312 Dysphagia, oropharyngeal phase: Secondary | ICD-10-CM | POA: Diagnosis not present

## 2019-05-04 DIAGNOSIS — G9341 Metabolic encephalopathy: Secondary | ICD-10-CM | POA: Diagnosis not present

## 2019-05-04 DIAGNOSIS — R2689 Other abnormalities of gait and mobility: Secondary | ICD-10-CM | POA: Diagnosis not present

## 2019-05-04 DIAGNOSIS — M6281 Muscle weakness (generalized): Secondary | ICD-10-CM | POA: Diagnosis not present

## 2019-05-04 DIAGNOSIS — R279 Unspecified lack of coordination: Secondary | ICD-10-CM | POA: Diagnosis not present

## 2019-05-05 DIAGNOSIS — G9341 Metabolic encephalopathy: Secondary | ICD-10-CM | POA: Diagnosis not present

## 2019-05-05 DIAGNOSIS — M6281 Muscle weakness (generalized): Secondary | ICD-10-CM | POA: Diagnosis not present

## 2019-05-05 DIAGNOSIS — R1312 Dysphagia, oropharyngeal phase: Secondary | ICD-10-CM | POA: Diagnosis not present

## 2019-05-05 DIAGNOSIS — R2689 Other abnormalities of gait and mobility: Secondary | ICD-10-CM | POA: Diagnosis not present

## 2019-05-05 DIAGNOSIS — G9340 Encephalopathy, unspecified: Secondary | ICD-10-CM | POA: Diagnosis not present

## 2019-05-05 DIAGNOSIS — R279 Unspecified lack of coordination: Secondary | ICD-10-CM | POA: Diagnosis not present

## 2019-05-08 DIAGNOSIS — G9341 Metabolic encephalopathy: Secondary | ICD-10-CM | POA: Diagnosis not present

## 2019-05-08 DIAGNOSIS — M6281 Muscle weakness (generalized): Secondary | ICD-10-CM | POA: Diagnosis not present

## 2019-05-08 DIAGNOSIS — G9340 Encephalopathy, unspecified: Secondary | ICD-10-CM | POA: Diagnosis not present

## 2019-05-08 DIAGNOSIS — R2689 Other abnormalities of gait and mobility: Secondary | ICD-10-CM | POA: Diagnosis not present

## 2019-05-08 DIAGNOSIS — R279 Unspecified lack of coordination: Secondary | ICD-10-CM | POA: Diagnosis not present

## 2019-05-08 DIAGNOSIS — R1312 Dysphagia, oropharyngeal phase: Secondary | ICD-10-CM | POA: Diagnosis not present

## 2019-05-09 ENCOUNTER — Other Ambulatory Visit: Payer: Self-pay | Admitting: *Deleted

## 2019-05-09 DIAGNOSIS — R1312 Dysphagia, oropharyngeal phase: Secondary | ICD-10-CM | POA: Diagnosis not present

## 2019-05-09 DIAGNOSIS — G9341 Metabolic encephalopathy: Secondary | ICD-10-CM | POA: Diagnosis not present

## 2019-05-09 DIAGNOSIS — R2689 Other abnormalities of gait and mobility: Secondary | ICD-10-CM | POA: Diagnosis not present

## 2019-05-09 DIAGNOSIS — M6281 Muscle weakness (generalized): Secondary | ICD-10-CM | POA: Diagnosis not present

## 2019-05-09 DIAGNOSIS — G9340 Encephalopathy, unspecified: Secondary | ICD-10-CM | POA: Diagnosis not present

## 2019-05-09 DIAGNOSIS — R279 Unspecified lack of coordination: Secondary | ICD-10-CM | POA: Diagnosis not present

## 2019-05-09 NOTE — Patient Outreach (Signed)
[facility]) Care Management  05/09/2019  SHANYN PREISLER July 28, 1930 [id number]   CSW was able to make contact with patient's daughter, Excell Seltzer today to follow-up regarding patient's progress at [facility] [place] where patient currently resides to receive short-term rehabilitative services.  Mrs. [name] reported that patient continues to do well at [facility], and is making great progress with therapies (both physical and occupational).  Mrs. [name] indicated that she is in constant communication with the staff at [facility] to obtain updates on patient, as she is still not able to visit patient at the facility, due to COVID-19 restrictions.  Mrs. [name] denied patient having a tentative discharge date from the facility, but believes that patient will be there for an additional two weeks, at least.  Mrs. [name] indicated that patient is very motivated for treatment because she is anxious to return home to live in her own environment.  Mrs. [name] reported that patient will continue to receive 24 hour care and supervision, upon returning home.  CSW agreed to follow-up with Mrs. [name] again next week, on Tuesday, May 16, 2019, around 9:00AM.  Mrs. [name] voiced understanding and was agreeable to this plan.  [name], BSW, MSW, LCSW  Licensed Education officer, environmental Health System  Mailing [place] [address] [place], [place] [address] Physical Address-[address], [place], [place] [address] Toll Free Main # [phone] Fax # [phone] Cell # [phone]  Office # [phone] [name]

## 2019-05-10 DIAGNOSIS — I10 Essential (primary) hypertension: Secondary | ICD-10-CM | POA: Diagnosis not present

## 2019-05-10 DIAGNOSIS — L88 Pyoderma gangrenosum: Secondary | ICD-10-CM | POA: Diagnosis not present

## 2019-05-10 DIAGNOSIS — R279 Unspecified lack of coordination: Secondary | ICD-10-CM | POA: Diagnosis not present

## 2019-05-10 DIAGNOSIS — G9340 Encephalopathy, unspecified: Secondary | ICD-10-CM | POA: Diagnosis not present

## 2019-05-10 DIAGNOSIS — G9341 Metabolic encephalopathy: Secondary | ICD-10-CM | POA: Diagnosis not present

## 2019-05-10 DIAGNOSIS — M259 Joint disorder, unspecified: Secondary | ICD-10-CM | POA: Diagnosis not present

## 2019-05-10 DIAGNOSIS — L97212 Non-pressure chronic ulcer of right calf with fat layer exposed: Secondary | ICD-10-CM | POA: Diagnosis not present

## 2019-05-10 DIAGNOSIS — M6281 Muscle weakness (generalized): Secondary | ICD-10-CM | POA: Diagnosis not present

## 2019-05-10 DIAGNOSIS — R1312 Dysphagia, oropharyngeal phase: Secondary | ICD-10-CM | POA: Diagnosis not present

## 2019-05-10 DIAGNOSIS — R2689 Other abnormalities of gait and mobility: Secondary | ICD-10-CM | POA: Diagnosis not present

## 2019-05-11 DIAGNOSIS — R1312 Dysphagia, oropharyngeal phase: Secondary | ICD-10-CM | POA: Diagnosis not present

## 2019-05-11 DIAGNOSIS — R2689 Other abnormalities of gait and mobility: Secondary | ICD-10-CM | POA: Diagnosis not present

## 2019-05-11 DIAGNOSIS — G9340 Encephalopathy, unspecified: Secondary | ICD-10-CM | POA: Diagnosis not present

## 2019-05-11 DIAGNOSIS — M6281 Muscle weakness (generalized): Secondary | ICD-10-CM | POA: Diagnosis not present

## 2019-05-11 DIAGNOSIS — R279 Unspecified lack of coordination: Secondary | ICD-10-CM | POA: Diagnosis not present

## 2019-05-11 DIAGNOSIS — G9341 Metabolic encephalopathy: Secondary | ICD-10-CM | POA: Diagnosis not present

## 2019-05-12 DIAGNOSIS — R2689 Other abnormalities of gait and mobility: Secondary | ICD-10-CM | POA: Diagnosis not present

## 2019-05-12 DIAGNOSIS — G9341 Metabolic encephalopathy: Secondary | ICD-10-CM | POA: Diagnosis not present

## 2019-05-12 DIAGNOSIS — R1312 Dysphagia, oropharyngeal phase: Secondary | ICD-10-CM | POA: Diagnosis not present

## 2019-05-12 DIAGNOSIS — R279 Unspecified lack of coordination: Secondary | ICD-10-CM | POA: Diagnosis not present

## 2019-05-12 DIAGNOSIS — G9340 Encephalopathy, unspecified: Secondary | ICD-10-CM | POA: Diagnosis not present

## 2019-05-12 DIAGNOSIS — M6281 Muscle weakness (generalized): Secondary | ICD-10-CM | POA: Diagnosis not present

## 2019-05-13 DIAGNOSIS — G9340 Encephalopathy, unspecified: Secondary | ICD-10-CM | POA: Diagnosis not present

## 2019-05-13 DIAGNOSIS — R2689 Other abnormalities of gait and mobility: Secondary | ICD-10-CM | POA: Diagnosis not present

## 2019-05-13 DIAGNOSIS — R1312 Dysphagia, oropharyngeal phase: Secondary | ICD-10-CM | POA: Diagnosis not present

## 2019-05-13 DIAGNOSIS — R279 Unspecified lack of coordination: Secondary | ICD-10-CM | POA: Diagnosis not present

## 2019-05-13 DIAGNOSIS — G9341 Metabolic encephalopathy: Secondary | ICD-10-CM | POA: Diagnosis not present

## 2019-05-13 DIAGNOSIS — M6281 Muscle weakness (generalized): Secondary | ICD-10-CM | POA: Diagnosis not present

## 2019-05-14 DIAGNOSIS — M6281 Muscle weakness (generalized): Secondary | ICD-10-CM | POA: Diagnosis not present

## 2019-05-14 DIAGNOSIS — G9340 Encephalopathy, unspecified: Secondary | ICD-10-CM | POA: Diagnosis not present

## 2019-05-14 DIAGNOSIS — R279 Unspecified lack of coordination: Secondary | ICD-10-CM | POA: Diagnosis not present

## 2019-05-14 DIAGNOSIS — G9341 Metabolic encephalopathy: Secondary | ICD-10-CM | POA: Diagnosis not present

## 2019-05-14 DIAGNOSIS — R1312 Dysphagia, oropharyngeal phase: Secondary | ICD-10-CM | POA: Diagnosis not present

## 2019-05-14 DIAGNOSIS — R2689 Other abnormalities of gait and mobility: Secondary | ICD-10-CM | POA: Diagnosis not present

## 2019-05-15 ENCOUNTER — Encounter: Payer: Self-pay | Admitting: *Deleted

## 2019-05-15 ENCOUNTER — Telehealth: Payer: Self-pay | Admitting: Cardiovascular Disease

## 2019-05-15 DIAGNOSIS — M6281 Muscle weakness (generalized): Secondary | ICD-10-CM | POA: Diagnosis not present

## 2019-05-15 DIAGNOSIS — G9341 Metabolic encephalopathy: Secondary | ICD-10-CM | POA: Diagnosis not present

## 2019-05-15 DIAGNOSIS — R1312 Dysphagia, oropharyngeal phase: Secondary | ICD-10-CM | POA: Diagnosis not present

## 2019-05-15 DIAGNOSIS — R279 Unspecified lack of coordination: Secondary | ICD-10-CM | POA: Diagnosis not present

## 2019-05-15 DIAGNOSIS — R2689 Other abnormalities of gait and mobility: Secondary | ICD-10-CM | POA: Diagnosis not present

## 2019-05-15 DIAGNOSIS — G9340 Encephalopathy, unspecified: Secondary | ICD-10-CM | POA: Diagnosis not present

## 2019-05-15 NOTE — Telephone Encounter (Signed)
   [facility] Pre-operative Risk Assessment    Request for surgical clearance:  1. What type of surgery is being performed? Lumbar decompression   2. When is this surgery scheduled? TBD   3. What type of clearance is required (medical clearance vs. Pharmacy clearance to hold med vs. Both)? Both   4. Are there any medications that need to be held prior to surgery and how long? Xarelto   5. Practice name and name of physician performing surgery? Dr. Latanya Maudlin with [facility]   6. What is your office phone number [phone]     7.   What is your office fax number [phone] Attn: Gl[place]ale Chard  8.   Anesthesia type (None, local, MAC, general) ? General (?)   Sheral Apley M 05/15/2019, 4:59 PM  _________________________________________________________________   (provider comments below)

## 2019-05-16 ENCOUNTER — Other Ambulatory Visit: Payer: Self-pay | Admitting: *Deleted

## 2019-05-16 DIAGNOSIS — R279 Unspecified lack of coordination: Secondary | ICD-10-CM | POA: Diagnosis not present

## 2019-05-16 DIAGNOSIS — R1312 Dysphagia, oropharyngeal phase: Secondary | ICD-10-CM | POA: Diagnosis not present

## 2019-05-16 DIAGNOSIS — G9341 Metabolic encephalopathy: Secondary | ICD-10-CM | POA: Diagnosis not present

## 2019-05-16 DIAGNOSIS — R2689 Other abnormalities of gait and mobility: Secondary | ICD-10-CM | POA: Diagnosis not present

## 2019-05-16 DIAGNOSIS — G9340 Encephalopathy, unspecified: Secondary | ICD-10-CM | POA: Diagnosis not present

## 2019-05-16 DIAGNOSIS — M6281 Muscle weakness (generalized): Secondary | ICD-10-CM | POA: Diagnosis not present

## 2019-05-16 NOTE — Telephone Encounter (Signed)
I have forwarded the clearance to the requesting provider, callback pool to contact patient and arrange followup (virtual) with Dr. Oval Linsey in late august or September to determine if Xarelto can be discontinued at that time

## 2019-05-16 NOTE — Telephone Encounter (Signed)
I have called [facility] in [place], her nurse says Mrs. Barnick has been recovering well. She walks about 10 steps each time before resting for 2-3 minutes. It still sounds like she is quite deconditioned.   Will forward to PharmD to evaluate how long to hold Xarelto. [facility] says she is still on Xarelto. Review of previous chart showed patient was cleared in Feb 2020 to proceed with this lumbar surgery and a low to medium risk patient to proceed with back surgery due to age and other medical issues. It was recommended she hold Xarelto for 3 days prior to surgery with heparin bridging in the hospital.   Looking back, it is questionable whether she should still be on Xarelto at this time, as her PE was in Sept 2019, Dr. Blenda Mounts note from Oct 2019 suggested her Xarelto probably should be discontinued after 6 month. She will not need bridging at this time. Patient was last seen by Kerin Ransom, I will reach out to him to discuss the plan as well.

## 2019-05-16 NOTE — Telephone Encounter (Signed)
   Primary Cardiologist: Skeet Latch, MD  Chart reviewed as part of pre-operative protocol coverage. Patient was contacted 05/16/2019 in reference to pre-operative risk assessment for pending surgery as outlined below.  [name] [name] was last seen on 04/24/2019 by Kerin Ransom PA-C.  Since that day, [name] [name] has done well and recovering at [facility]. According to her nurse, she is active in participation with rehab, however functional ability is still limited. There has been no reported chest pain or worsening SOB recently.   Therefore, based on ACC/AHA guidelines, the patient would be at acceptable risk for the planned procedure without further cardiovascular testing. She is likely a moderate risk patient given her previous history and recent decline.   I will route this recommendation to the requesting party via Epic fax function and remove from pre-op pool.  Please call with questions. She will need to hold Xarelto for 3 days prior to back surgery.   [place], [place] 05/16/2019, 6:39 PM

## 2019-05-16 NOTE — Telephone Encounter (Signed)
I discussed the case with Kerin Ransom PA-C who recently saw the patient, at this time, I feel her poor functional ability still make her a high risk patient to develop recurrent DVT/PE. The safest course is probably continue Xarelto, hold it for 3 days prior to surgery and restart after surgery. We can reevaluate the patient via telehealth visit about 3 weeks after the surgery with Dr. Oval Linsey and make a clinical decision at that time whether to discontinue the Xarelto.   Clinical pharmacist to comment on how long to hold Xarelto. Will forward to Dr. Oval Linsey as courtesy, but note Dr. Oval Linsey is on maternity leave and likely won't be able to respond.

## 2019-05-16 NOTE — Telephone Encounter (Signed)
Agree since pt is > 6 months out from treatment for PE that she should be safe to hold Xarelto for 3 days prior to spinal procedure. Renal function is normal.

## 2019-05-16 NOTE — Patient Outreach (Signed)
[facility]) Care Management  05/16/2019  [name] [name] March 05, 1930 [id number]   CSW was able to make contact with patient's daughter, [name] [name] today to follow-up regarding patient's progress with therapies (both physical and occupational) at [facility] [place] where patient currently resides to receive short-term rehabilitative services.  Mrs. [name] [name] reported that patient continues to do very well at [facility], really enjoying her stay at the facility.  Mrs. [name] [name] went on to say that patient is making great progress, now able to pull herself up out of bed with a hoyer lift and stand long enough to position herself into her wheelchair.  Mrs. [name] [name] is extremely pleased with the care that patient is receiving at [facility].  Mrs. [name] [name] indicated that patient is scheduled to receive elective Spinal Stenosis surgery within the next few months, in hopes that she will be able to walk again.  Patient is also wanting to be taken off all narcotics, praying that the surgery will eliminate her pain.  Mrs. [name] [name] reported that patient will remain at [facility] until her surgery, then return to [facility] for more rehabilitative services once she is medically cleared by her Psychologist, sport and exercise.  Mrs. [name] [name] believes that patient will remain at [facility] at least until September of 2020.  CSW will follow-up with Mrs. [name] [name] on Tuesday, May 30, 2019, around 9:00AM, to see if patient has been scheduled for surgery, as well as to assess patient's progress with therapies.  [name] [name], BSW, MSW, LCSW  Licensed Education officer, [facility]  Mailing [place] [address], [address], [place] [address] Physical Address-[address], [place], [place] [address] Toll Free Main # [phone] Fax # [phone] Cell # [phone]  Office # [phone] [name]

## 2019-05-17 DIAGNOSIS — M6281 Muscle weakness (generalized): Secondary | ICD-10-CM | POA: Diagnosis not present

## 2019-05-17 DIAGNOSIS — G9340 Encephalopathy, unspecified: Secondary | ICD-10-CM | POA: Diagnosis not present

## 2019-05-17 DIAGNOSIS — R279 Unspecified lack of coordination: Secondary | ICD-10-CM | POA: Diagnosis not present

## 2019-05-17 DIAGNOSIS — R1312 Dysphagia, oropharyngeal phase: Secondary | ICD-10-CM | POA: Diagnosis not present

## 2019-05-17 DIAGNOSIS — G9341 Metabolic encephalopathy: Secondary | ICD-10-CM | POA: Diagnosis not present

## 2019-05-17 DIAGNOSIS — R2689 Other abnormalities of gait and mobility: Secondary | ICD-10-CM | POA: Diagnosis not present

## 2019-05-17 NOTE — Telephone Encounter (Addendum)
Reached out to Dr. Blenda Mounts nurse about her schedule so patient can get scheduled for her follow up appointment.   Called the patient Mrs. Prigmore and her daughter [name] who is on the patient's DPR answered the number listed. She informed me that her mother Mrs. Siharath is in a rehab facility. I informed Mrs. [name] that her mother has been cleared for her upcoming back surgery and she needs to hold her Xarelto for 3 days prior to the surgery and to restart as soon as possible. Mrs [name] wanted to know if we made the facility know because they handle her mother's medication. She gave the name of the facility [facility] and the number [phone]. Made Almyra Deforest, PA-C aware and he will call the facility to make them aware about holding the patients Xarelto.

## 2019-05-18 DIAGNOSIS — R279 Unspecified lack of coordination: Secondary | ICD-10-CM | POA: Diagnosis not present

## 2019-05-18 DIAGNOSIS — R1312 Dysphagia, oropharyngeal phase: Secondary | ICD-10-CM | POA: Diagnosis not present

## 2019-05-18 DIAGNOSIS — M6281 Muscle weakness (generalized): Secondary | ICD-10-CM | POA: Diagnosis not present

## 2019-05-18 DIAGNOSIS — R2689 Other abnormalities of gait and mobility: Secondary | ICD-10-CM | POA: Diagnosis not present

## 2019-05-18 DIAGNOSIS — G9340 Encephalopathy, unspecified: Secondary | ICD-10-CM | POA: Diagnosis not present

## 2019-05-18 DIAGNOSIS — G9341 Metabolic encephalopathy: Secondary | ICD-10-CM | POA: Diagnosis not present

## 2019-05-19 DIAGNOSIS — G9340 Encephalopathy, unspecified: Secondary | ICD-10-CM | POA: Diagnosis not present

## 2019-05-19 DIAGNOSIS — R1312 Dysphagia, oropharyngeal phase: Secondary | ICD-10-CM | POA: Diagnosis not present

## 2019-05-19 DIAGNOSIS — G9341 Metabolic encephalopathy: Secondary | ICD-10-CM | POA: Diagnosis not present

## 2019-05-19 DIAGNOSIS — R2689 Other abnormalities of gait and mobility: Secondary | ICD-10-CM | POA: Diagnosis not present

## 2019-05-19 DIAGNOSIS — R279 Unspecified lack of coordination: Secondary | ICD-10-CM | POA: Diagnosis not present

## 2019-05-19 DIAGNOSIS — M6281 Muscle weakness (generalized): Secondary | ICD-10-CM | POA: Diagnosis not present

## 2019-05-20 DIAGNOSIS — R2689 Other abnormalities of gait and mobility: Secondary | ICD-10-CM | POA: Diagnosis not present

## 2019-05-20 DIAGNOSIS — G9340 Encephalopathy, unspecified: Secondary | ICD-10-CM | POA: Diagnosis not present

## 2019-05-20 DIAGNOSIS — G9341 Metabolic encephalopathy: Secondary | ICD-10-CM | POA: Diagnosis not present

## 2019-05-20 DIAGNOSIS — R1312 Dysphagia, oropharyngeal phase: Secondary | ICD-10-CM | POA: Diagnosis not present

## 2019-05-20 DIAGNOSIS — R279 Unspecified lack of coordination: Secondary | ICD-10-CM | POA: Diagnosis not present

## 2019-05-20 DIAGNOSIS — M6281 Muscle weakness (generalized): Secondary | ICD-10-CM | POA: Diagnosis not present

## 2019-05-21 DIAGNOSIS — R2689 Other abnormalities of gait and mobility: Secondary | ICD-10-CM | POA: Diagnosis not present

## 2019-05-21 DIAGNOSIS — R279 Unspecified lack of coordination: Secondary | ICD-10-CM | POA: Diagnosis not present

## 2019-05-21 DIAGNOSIS — M6281 Muscle weakness (generalized): Secondary | ICD-10-CM | POA: Diagnosis not present

## 2019-05-21 DIAGNOSIS — G9340 Encephalopathy, unspecified: Secondary | ICD-10-CM | POA: Diagnosis not present

## 2019-05-21 DIAGNOSIS — G9341 Metabolic encephalopathy: Secondary | ICD-10-CM | POA: Diagnosis not present

## 2019-05-21 DIAGNOSIS — R1312 Dysphagia, oropharyngeal phase: Secondary | ICD-10-CM | POA: Diagnosis not present

## 2019-05-23 DIAGNOSIS — M6281 Muscle weakness (generalized): Secondary | ICD-10-CM | POA: Diagnosis not present

## 2019-05-23 DIAGNOSIS — G9340 Encephalopathy, unspecified: Secondary | ICD-10-CM | POA: Diagnosis not present

## 2019-05-23 DIAGNOSIS — R1312 Dysphagia, oropharyngeal phase: Secondary | ICD-10-CM | POA: Diagnosis not present

## 2019-05-23 DIAGNOSIS — G9341 Metabolic encephalopathy: Secondary | ICD-10-CM | POA: Diagnosis not present

## 2019-05-23 DIAGNOSIS — R279 Unspecified lack of coordination: Secondary | ICD-10-CM | POA: Diagnosis not present

## 2019-05-23 DIAGNOSIS — R2689 Other abnormalities of gait and mobility: Secondary | ICD-10-CM | POA: Diagnosis not present

## 2019-05-24 DIAGNOSIS — R1312 Dysphagia, oropharyngeal phase: Secondary | ICD-10-CM | POA: Diagnosis not present

## 2019-05-24 DIAGNOSIS — G9340 Encephalopathy, unspecified: Secondary | ICD-10-CM | POA: Diagnosis not present

## 2019-05-24 DIAGNOSIS — M6281 Muscle weakness (generalized): Secondary | ICD-10-CM | POA: Diagnosis not present

## 2019-05-24 DIAGNOSIS — G9341 Metabolic encephalopathy: Secondary | ICD-10-CM | POA: Diagnosis not present

## 2019-05-24 DIAGNOSIS — R279 Unspecified lack of coordination: Secondary | ICD-10-CM | POA: Diagnosis not present

## 2019-05-24 DIAGNOSIS — R2689 Other abnormalities of gait and mobility: Secondary | ICD-10-CM | POA: Diagnosis not present

## 2019-05-25 DIAGNOSIS — M48061 Spinal stenosis, lumbar region without neurogenic claudication: Secondary | ICD-10-CM | POA: Diagnosis not present

## 2019-05-25 DIAGNOSIS — G9340 Encephalopathy, unspecified: Secondary | ICD-10-CM | POA: Diagnosis not present

## 2019-05-25 DIAGNOSIS — M6281 Muscle weakness (generalized): Secondary | ICD-10-CM | POA: Diagnosis not present

## 2019-05-25 DIAGNOSIS — G9341 Metabolic encephalopathy: Secondary | ICD-10-CM | POA: Diagnosis not present

## 2019-05-25 DIAGNOSIS — R279 Unspecified lack of coordination: Secondary | ICD-10-CM | POA: Diagnosis not present

## 2019-05-25 DIAGNOSIS — R1312 Dysphagia, oropharyngeal phase: Secondary | ICD-10-CM | POA: Diagnosis not present

## 2019-05-25 DIAGNOSIS — R2689 Other abnormalities of gait and mobility: Secondary | ICD-10-CM | POA: Diagnosis not present

## 2019-05-26 DIAGNOSIS — G9340 Encephalopathy, unspecified: Secondary | ICD-10-CM | POA: Diagnosis not present

## 2019-05-26 DIAGNOSIS — M6281 Muscle weakness (generalized): Secondary | ICD-10-CM | POA: Diagnosis not present

## 2019-05-26 DIAGNOSIS — R1312 Dysphagia, oropharyngeal phase: Secondary | ICD-10-CM | POA: Diagnosis not present

## 2019-05-26 DIAGNOSIS — G9341 Metabolic encephalopathy: Secondary | ICD-10-CM | POA: Diagnosis not present

## 2019-05-26 DIAGNOSIS — R2689 Other abnormalities of gait and mobility: Secondary | ICD-10-CM | POA: Diagnosis not present

## 2019-05-26 DIAGNOSIS — R279 Unspecified lack of coordination: Secondary | ICD-10-CM | POA: Diagnosis not present

## 2019-05-27 DIAGNOSIS — G9341 Metabolic encephalopathy: Secondary | ICD-10-CM | POA: Diagnosis not present

## 2019-05-27 DIAGNOSIS — M6281 Muscle weakness (generalized): Secondary | ICD-10-CM | POA: Diagnosis not present

## 2019-05-27 DIAGNOSIS — R279 Unspecified lack of coordination: Secondary | ICD-10-CM | POA: Diagnosis not present

## 2019-05-27 DIAGNOSIS — R1312 Dysphagia, oropharyngeal phase: Secondary | ICD-10-CM | POA: Diagnosis not present

## 2019-05-27 DIAGNOSIS — G9340 Encephalopathy, unspecified: Secondary | ICD-10-CM | POA: Diagnosis not present

## 2019-05-27 DIAGNOSIS — R2689 Other abnormalities of gait and mobility: Secondary | ICD-10-CM | POA: Diagnosis not present

## 2019-05-28 DIAGNOSIS — G9341 Metabolic encephalopathy: Secondary | ICD-10-CM | POA: Diagnosis not present

## 2019-05-28 DIAGNOSIS — R2689 Other abnormalities of gait and mobility: Secondary | ICD-10-CM | POA: Diagnosis not present

## 2019-05-28 DIAGNOSIS — M6281 Muscle weakness (generalized): Secondary | ICD-10-CM | POA: Diagnosis not present

## 2019-05-28 DIAGNOSIS — G9340 Encephalopathy, unspecified: Secondary | ICD-10-CM | POA: Diagnosis not present

## 2019-05-28 DIAGNOSIS — R279 Unspecified lack of coordination: Secondary | ICD-10-CM | POA: Diagnosis not present

## 2019-05-28 DIAGNOSIS — R1312 Dysphagia, oropharyngeal phase: Secondary | ICD-10-CM | POA: Diagnosis not present

## 2019-05-29 ENCOUNTER — Ambulatory Visit (INDEPENDENT_AMBULATORY_CARE_PROVIDER_SITE_OTHER): Payer: Medicare Other | Admitting: Cardiology

## 2019-05-29 ENCOUNTER — Encounter: Payer: Self-pay | Admitting: Cardiology

## 2019-05-29 ENCOUNTER — Other Ambulatory Visit: Payer: Self-pay

## 2019-05-29 VITALS — BP 118/70 | HR 78 | Temp 98.7°F | Ht 64.0 in

## 2019-05-29 DIAGNOSIS — I5042 Chronic combined systolic (congestive) and diastolic (congestive) heart failure: Secondary | ICD-10-CM | POA: Diagnosis not present

## 2019-05-29 DIAGNOSIS — G9341 Metabolic encephalopathy: Secondary | ICD-10-CM | POA: Diagnosis not present

## 2019-05-29 DIAGNOSIS — E8770 Fluid overload, unspecified: Secondary | ICD-10-CM | POA: Insufficient documentation

## 2019-05-29 DIAGNOSIS — R600 Localized edema: Secondary | ICD-10-CM

## 2019-05-29 DIAGNOSIS — M6281 Muscle weakness (generalized): Secondary | ICD-10-CM | POA: Diagnosis not present

## 2019-05-29 DIAGNOSIS — Z01818 Encounter for other preprocedural examination: Secondary | ICD-10-CM | POA: Insufficient documentation

## 2019-05-29 DIAGNOSIS — R279 Unspecified lack of coordination: Secondary | ICD-10-CM | POA: Diagnosis not present

## 2019-05-29 DIAGNOSIS — G9340 Encephalopathy, unspecified: Secondary | ICD-10-CM | POA: Diagnosis not present

## 2019-05-29 DIAGNOSIS — R2689 Other abnormalities of gait and mobility: Secondary | ICD-10-CM | POA: Diagnosis not present

## 2019-05-29 DIAGNOSIS — R1312 Dysphagia, oropharyngeal phase: Secondary | ICD-10-CM | POA: Diagnosis not present

## 2019-05-29 MED ORDER — FUROSEMIDE 40 MG PO TABS: 40.0000 mg | ORAL_TABLET | Freq: Every day | ORAL | 1 refills | Status: DC

## 2019-05-29 NOTE — Patient Instructions (Addendum)
Medication Instructions:  INCREASE Lasix to 40mg  once a day  If you need a refill on your cardiac medications before your next appointment, please call your pharmacy.   Lab work: Your physician recommends that you return for lab work in: TODAY-BMET, BNP If you have labs (blood work) drawn today and your tests are completely normal, you will receive your results only by: Marland Kitchen MyChart Message (if you have MyChart) OR . A paper copy in the mail If you have any lab test that is abnormal or we need to change your treatment, we will call you to review the results.  Testing/Procedures: Your physician has requested that you have an echocardiogram. Echocardiography is a painless test that uses sound waves to create images of your heart. It provides your doctor with information about the size and shape of your heart and how well your heart's chambers and valves are working. This procedure takes approximately one hour. There are no restrictions for this procedure. [place]  Follow-Up: At [facility], you and your health needs are our priority.  As part of our continuing mission to provide you with exceptional heart care, we have created designated Provider Care Teams.  These Care Teams include your primary Cardiologist (physician) and Advanced Practice Providers (APPs -  Physician Assistants and Nurse Practitioners) who all work together to provide you with the care you need, when you need it. You will need a follow up appointment in 3 months. Please call our office 2 months in advance to schedule this appointment.  You may see Skeet Latch, MD or one of the following Advanced Practice Providers on your designated Care Team:   Kerin Ransom, PA-C Roby Lofts, [place] . Sande Rives, PA-C  Any Other Special Instructions Will Be Listed Below (If Applicable).

## 2019-05-29 NOTE — Progress Notes (Signed)
Cardiology Office Note:    Date:  05/29/2019   ID:  TERENCE GOOGE, DOB 11-29-1929, MRN [id number]  PCP:  Briscoe Deutscher, DO  Cardiologist:  Skeet Latch, MD  Electrophysiologist:  None   Referring MD: Briscoe Deutscher, DO   Chief Complaint  Patient presents with  . other    Medical Clearance. Medications reviewed verbally with the patient.     History of Present Illness:    [name] [name] is a 85 y.o. female with a hx of congestive heart failure, rheumatoid arthritis on steroids, type II non-insulin-dependent diabetes, and prior stroke.  In July 2018 she had an ejection fraction of 20 to 25% by echo.  Cardiac catheterization showed no significant coronary disease.  She was treated medically and a follow-up echocardiogram in October 2019 showed her ejection fraction to be 60 to 65%.  In September 2019 she had a pulmonary embolism and was placed on Xarelto.  Dr. Oval Linsey saw her Mar 17, 2019.  She was doing well at that time, she was seen as a preop clearance.  From a cardiac standpoint she was cleared for hand surgery though it was recommended that she be crossed over with Lovenox when she came off Xarelto then.  A week or so later the patient was admitted to the hospital May 20 to May 25 with mental status changes presumed to be secondary to polypharmacy and hyponatremia.  She was discharged on the 25th but came back to the emergency room on May 28, brought in by the family requesting skilled nursing placement, the patient had previously declined this.  On 529 she was placed in a skilled facility. She was seen as a virtual visit in June.   The pt has gradually declined since admission to the care facility according to the patient's daughter.  The pt is now unable to stand on her own and is moved with a [employer] fast streamer.  The patient denies orthopnea but does have LE edema bilaterally, she is unable to get her shoes on now secondary to edema. She needs lumbar decompression and was actually  cleared for this virtually but dr Gladstone Lighter and the patient's daughter  felt an office visit would be more appropriate.    Past Medical History:  Diagnosis Date  . Allergic rhinitis due to pollen 04/27/2007  . Arthritis    "in q joint" (08/07/2013)  . CHF (congestive heart failure) ([place])   . Coronary atherosclerosis of native coronary artery   . Cough   . Edema 05/03/2013  . Exertional shortness of breath    "sometimes" (08/07/2013)  . First degree atrioventricular block   . Gout, unspecified 04/19/2013  . Hyperlipidemia 04/27/2007  . Hypertension   . Midline low back pain without sciatica 06/27/2014  . Muscle spasm   . Muscle weakness (generalized)   . Onychia and paronychia of toe   . Osteoarthrosis, unspecified whether generalized or localized, unspecified site 04/27/2007  . Other fall   . Other malaise and fatigue   . Pneumonia 05/2018  . Prepatellar bursitis   . Pulmonary embolism ([place]) 07/2018  . Rheumatic nodule 06/28/2017  . Seizures ([place])   . Stroke ([place]) 01/17/2014  . Type II diabetes mellitus ([place])    "fasting 90-110s" (08/07/2013)  . Unspecified vitamin D deficiency     Past Surgical History:  Procedure Laterality Date  . ABDOMINAL HYSTERECTOMY  04/1980  . APPENDECTOMY  1960  . CARDIAC CATHETERIZATION  2003  . CATARACT EXTRACTION W/ INTRAOCULAR LENS  IMPLANT, BILATERAL  Bilateral ~ 2012  . CHOLECYSTECTOMY N/A 06/26/2018   Procedure: LAPAROSCOPIC CHOLECYSTECTOMY POSSIBLE INTRAOPERATIVE CHOLANGIOGRAM;  Surgeon: Abigail Miyamoto, MD;  Location: [facility] OR;  Service: General;  Laterality: N/A;  . JOINT REPLACEMENT    . REPLACEMENT TOTAL KNEE Right 09/2005  . RIGHT/LEFT HEART CATH AND CORONARY ANGIOGRAPHY N/A 06/01/2018   Procedure: RIGHT/LEFT HEART CATH AND CORONARY ANGIOGRAPHY;  Surgeon: Corky Crafts, MD;  Location[facility] INVAS[facility] CV LAB;  Service: Cardiovascular;  Laterality: N/A;  . SHOULDER OPEN ROTATOR CUFF REPAIR Right 07/1999  . TONSILLECTOMY  08/1974  . TOTAL  KNEE ARTHROPLASTY Left 08/07/2013   Procedure: TOTAL KNEE ARTHROPLASTY- LEFT;  Surgeon: Dannielle Huh, MD;  Location: [facility] OR;  Service: Orthopedics;  Laterality: Left;  . TRANSTHORACIC ECHOCARDIOGRAM  2003   EF 55-65%; mild concentric LVH    Current Medications: Current Meds  Medication Sig  . acetaminophen (TYLENOL) 500 MG tablet Take 1,000 mg by mouth every 12 (twelve) hours.   Marland Kitchen albuterol (PROVENTIL HFA;VENTOLIN HFA) 108 (90 Base) MCG/ACT inhaler Inhale 1-2 puffs into the lungs every 6 (six) hours as needed for wheezing or shortness of breath.  . allopurinol (ZYLOPRIM) 100 MG tablet Take 1 tablet (100 mg total) by mouth at bedtime.  . carvedilol (COREG) 25 MG tablet Take 1 tablet (25 mg total) by mouth 2 (two) times daily with a meal.  . D3-1000 25 MCG (1000 UT) capsule Take 1 capsule (1,000 Units total) by mouth daily. (Patient taking differently: Take 1,000 Units by mouth at bedtime. )  . docusate sodium (COLACE) 100 MG capsule Take 1 capsule (100 mg total) by mouth daily as needed for mild constipation.  . ENSURE (ENSURE) Take 237 mLs by mouth every evening.  . ferrous sulfate 325 (65 FE) MG tablet Take 325 mg by mouth every other day.  . furosemide (LASIX) 40 MG tablet Take 1 tablet (40 mg total) by mouth daily.  Marland Kitchen gabapentin (NEURONTIN) 100 MG capsule Take 1 capsule (100 mg total) by mouth 3 (three) times daily.  . hydrALAZINE (APRESOLINE) 100 MG tablet Take 1 tablet (100 mg total) by mouth 3 (three) times daily.  . hydroxychloroquine (PLAQUENIL) 200 MG tablet Take 1 tablet (200 mg total) by mouth 2 (two) times daily.  . hydroxypropyl methylcellulose / hypromellose (ISOPTO TEARS / GONIOVISC) 2.5 % ophthalmic solution Place 1 drop into both eyes daily.  . isosorbide mononitrate (IMDUR) 30 MG 24 hr tablet Take 1 tablet (30 mg total) by mouth at bedtime.  Marland Kitchen leflunomide (ARAVA) 20 MG tablet Take 1 tablet (20 mg total) by mouth daily.  . Lidocaine (ASPERCREME LIDOCAINE) 4 % PTCH Apply 1  Package topically daily.   Marland Kitchen LORazepam (ATIVAN) 0.5 MG tablet Take 0.25 mg by mouth as needed for anxiety.   . Melatonin 3 MG TABS Take 6 mg by mouth at bedtime.   . Olopatadine HCl 0.2 % SOLN Place 1 drop into both eyes daily.  . potassium chloride SA (K-DUR) 20 MEQ tablet Take 20 mEq by mouth See admin instructions. Monday and Thursday  . predniSONE (DELTASONE) 10 MG tablet Take 10 mg by mouth daily with breakfast.  . rivaroxaban (XARELTO) 20 MG TABS tablet Take 1 tablet (20 mg total) by mouth daily with supper.  . saccharomyces boulardii (FLORASTOR) 250 MG capsule Take 250 mg by mouth every other day.  . sacubitril-valsartan (ENTRESTO) 49-51 MG Take 1 tablet by mouth daily at 12 noon.  . sitaGLIPtin (JANUVIA) 25 MG tablet Take 12.5 mg by mouth daily.   . [  DISCONTINUED] furosemide (LASIX) 20 MG tablet Take 1 tablet (20 mg total) by mouth daily.     Allergies:   Clonidine derivatives, Fish allergy, Shellfish allergy, Doxycycline, Indomethacin, Lyrica [pregabalin], Methyldopa, Morphine and related, Orange fruit [citrus], Strawberry (diagnostic), Cetirizine hcl, Codeine, Levaquin [levofloxacin in d5w], and Tomato   Social History   Socioeconomic History  . Marital status: Widowed    Spouse name: Not on file  . Number of children: 3  . Years of education: Not on file  . Highest education level: Not on file  Occupational History  . Not on file  Social Needs  . Financial resource strain: Not on file  . Food insecurity    Worry: Not on file    Inability: Not on file  . Transportation needs    Medical: Not on file    Non-medical: Not on file  Tobacco Use  . Smoking status: Never Smoker  . Smokeless tobacco: Never Used  Substance and Sexual Activity  . Alcohol use: No    Alcohol/week: 0.0 standard drinks  . Drug use: No  . Sexual activity: Not Currently  Lifestyle  . Physical activity    Days per week: Not on file    Minutes per session: Not on file  . Stress: Not on file   Relationships  . Social Musician on phone: Not on file    Gets together: Not on file    Attends religious service: Not on file    Active member of club or organization: Not on file    Attends meetings of clubs or organizations: Not on file    Relationship status: Not on file  Other Topics Concern  . Not on file  Social History Narrative   Widowed   Walks with cane     Family History: The patient's family history includes Heart attack in her father.  ROS:   Please see the history of present illness.     All other systems reviewed and are negative.  EKGs/Labs/Other Studies Reviewed:    The following studies were reviewed today: Cath   EKG:  EKG is ordered today.  The ekg ordered today demonstrates   Recent Labs: 01/12/2019: Magnesium 1.9 03/29/2019: TSH 1.521 04/05/2019: Hemoglobin 9.0; Platelets 330 04/06/2019: ALT 9; B Natriuretic Peptide 4,003.8; BUN 17; Creatinine, Ser 0.75; Potassium 4.0; Sodium 133  Recent Lipid Panel    Component Value Date/Time   CHOL 202 (H) 06/25/2018 0607   CHOL 185 03/04/2016 1620   TRIG 67 06/25/2018 0607   HDL 63 06/25/2018 0607   HDL 73 03/04/2016 1620   CHOLHDL 3.2 06/25/2018 0607   VLDL 13 06/25/2018 0607   LDLCALC 126 (H) 06/25/2018 0607   LDLCALC 89 03/04/2016 1620    Physical Exam:    VS:  BP 118/70 (BP Location: Right Arm, Patient Position: Sitting, Cuff Size: Large)   Pulse 78   Temp 98.7 F (37.1 C) (Temporal)   Ht 5\' 4"  (1.626 m)   BMI 29.78 kg/m     Wt Readings from Last 3 Encounters:  04/05/19 173 lb 8 oz (78.7 kg)  04/03/19 173 lb 8 oz (78.7 kg)  03/17/19 178 lb 6.4 oz (80.9 kg)     GEN: obese AA female in wheel chair- no acute distress HEENT: Normal NECK: No JVD; No carotid bruits LYMPHATICS: No lymphadenopathy CARDIAC: RRR, no murmurs, rubs, gallops RESPIRATORY:  decreased overall  ABDOMEN: Soft, non-tender, non-distended MUSCULOSKELETAL:  Pitting LE edema to her knees No deformity  SKIN: Warm  and dry NEUROLOGIC:  Alert and oriented x 3 PSYCHIATRIC:  Normal affect   ASSESSMENT:    Pre-operative clearance Seen today for clearance prior to lumbar decompression  Volume overload Pt does appear volume overloaded on exam today with bilateral LE pitting edema to her knees.  Typical measures for following heart failure not available to us as she is totally unable to bear weight.   NICM- EF 20-25% July 2019, 60-65% Oct 2019 Non obstructive CAD July 2019  H/O PE- PE diagnosed Sept 2019- Xarelto  RA- Chronic steroids. Pt is debilitated and wheel chair bound, in care facility.  PLAN:    Check echo, check, BMP, BNP, increase Lasix to 40 mg daily.  Pre op clearance pending the above tests.    Medication Adjustments/Labs and Tests Ordered: Current medicines are reviewed at length with the patient today.  Concerns regarding medicines are outlined above.  Orders Placed This Encounter  Procedures  . Basic Metabolic Panel (BMET)  . Pro b natriuretic peptide (BNP)[id number] [facility])  . ECHOCARDIOGRAM COMPLETE   Meds ordered this encounter  Medications  . furosemide (LASIX) 40 MG tablet    Sig: Take 1 tablet (40 mg total) by mouth daily.    Dispense:  90 tablet    Refill:  1    Patient Instructions  Medication Instructions:  INCREASE Lasix to 20mg  once a day  If you need a refill on your cardiac medications before your next appointment, please call your pharmacy.   Lab work: Your physician recommends that you return for lab work in: TODAY-BMET, BNP If you have labs (blood work) drawn today and your tests are completely normal, you will receive your results only by: Marland Kitchen. MyChart Message (if you have MyChart) OR . A paper copy in the mail If you have any lab test that is abnormal or we need to change your treatment, we will call you to review the results.  Testing/Procedures: Your physician has requested that you have an echocardiogram. Echocardiography is a  painless test that uses sound waves to create images of your heart. It provides your doctor with information about the size and shape of your heart and how well your heart's chambers and valves are working. This procedure takes approximately one hour. There are no restrictions for this procedure. [address]  Follow-Up: At [facility], you and your health needs are our priority.  As part of our continuing mission to provide you with exceptional heart care, we have created designated Provider Care Teams.  These Care Teams include your primary Cardiologist (physician) and Advanced Practice Providers (APPs -  Physician Assistants and Nurse Practitioners) who all work together to provide you with the care you need, when you need it. You will need a follow up appointment in 3 months. Please call our office 2 months in advance to schedule this appointment.  You may see Chilton Siiffany [place], MD or one of the following Advanced Practice Providers on your designated Care Team:   Corine ShelterLuke Rahiem Schellinger, PA-C Judy PimpleKrista Kroeger, [place] JerseyPA-C . Marjie Skiffallie Goodrich, PA-C   Any Other Special Instructions Will Be Listed Below (If Applicable).      Signed, Corine ShelterLuke Jhamari Markowicz, PA-C  05/29/2019 12:20 PM    [facility]

## 2019-05-29 NOTE — Assessment & Plan Note (Signed)
Pt does appear volume overloaded on exam today with bilateral LE pitting edema to her knees.  Typical measures for following heart failure not available to [place] as she is totally unable to bear weight.

## 2019-05-29 NOTE — Assessment & Plan Note (Signed)
Seen today for clearance prior to lumbar decompression

## 2019-05-30 ENCOUNTER — Encounter: Payer: Self-pay | Admitting: *Deleted

## 2019-05-30 ENCOUNTER — Other Ambulatory Visit: Payer: Self-pay | Admitting: *Deleted

## 2019-05-30 DIAGNOSIS — G9340 Encephalopathy, unspecified: Secondary | ICD-10-CM | POA: Diagnosis not present

## 2019-05-30 DIAGNOSIS — R2689 Other abnormalities of gait and mobility: Secondary | ICD-10-CM | POA: Diagnosis not present

## 2019-05-30 DIAGNOSIS — G9341 Metabolic encephalopathy: Secondary | ICD-10-CM | POA: Diagnosis not present

## 2019-05-30 DIAGNOSIS — R279 Unspecified lack of coordination: Secondary | ICD-10-CM | POA: Diagnosis not present

## 2019-05-30 DIAGNOSIS — R1312 Dysphagia, oropharyngeal phase: Secondary | ICD-10-CM | POA: Diagnosis not present

## 2019-05-30 DIAGNOSIS — M6281 Muscle weakness (generalized): Secondary | ICD-10-CM | POA: Diagnosis not present

## 2019-05-30 LAB — BASIC METABOLIC PANEL
BUN/Creatinine Ratio: 17 (ref 12–28)
BUN: 15 mg/dL (ref 8–27)
CO2: 22 mmol/L (ref 20–29)
Calcium: 8.9 mg/dL (ref 8.7–10.3)
Chloride: 95 mmol/L — ABNORMAL LOW (ref 96–106)
Creatinine, Ser: 0.86 mg/dL (ref 0.57–1.00)
GFR calc Af Amer: 70 mL/min/{1.73_m2} (ref >59)
GFR calc non Af Amer: 61 mL/min/{1.73_m2} (ref >59)
Glucose: 91 mg/dL (ref 65–99)
Potassium: 4.6 mmol/L (ref 3.5–5.2)
Sodium: 131 mmol/L — ABNORMAL LOW (ref 134–144)

## 2019-05-30 LAB — PRO B NATRIURETIC PEPTIDE: NT-Pro BNP: 14600 pg/mL — ABNORMAL HIGH (ref 0–738)

## 2019-05-30 NOTE — Patient Outreach (Signed)
[facility]) Care Management  05/30/2019  TONYIA MARSCHALL [birth year] [id number]  CSW was able to make contact with patient's daughter, Excell Seltzer today to follow-up regarding patient's progress at [facility] [place] where patient currently resides to receive short-term rehabilitative services.  Mrs. [name] reported that patient is actually doing quite well at [facility], getting stronger every day, but still not able to bear weight or ambulate.  The staff at [facility] are having to use a hoyer lift to get patient from the bed into her wheelchair, as patient is still unable to stand alone.  Mrs. [name] reported that patient denies having any pain, despite needing lumbar decompression. Mrs. [name] fears that patient will not be medically cleared for this surgery, as her medical condition has deteriorated somewhat rapidly since being admitted to [facility].  Mrs. [name] met patient at [facility] yesterday (Monday, May 29, 2019) for her cardiology appointment with Physician Assistant, Kerin Ransom to try and obtain medical clearance for lumbar decompression surgery.    It was recommended that patient receive an echocardiogram, which is scheduled for tomorrow (Wednesday, May 31, 2019 at 3:05PM) at the [facility].  Mrs. Hampton admitted that patient really wants to be able to return home to live independently, but realizes that she is unable to do so, as long as she is confined to her wheelchair.  Mrs. [name] stated, "It's really a shame because mom's mind is clear and her cognitive skills are as good as ever".  Mrs. [name] is considering bringing patient home to live with her, once patient is discharged from [facility].  Mrs. [name] reported, "We really won't know anything for a couple more weeks, so why don't you call me back the first week of August".  CSW agreed  to follow-up with Mrs. [name] again in a few weeks, on Tuesday, June 13, 2019, around 9:00AM.  If Mrs. [name] decides to take patient home, CSW offered to assist her with arranging for in-home care services for patient, as well as home health services.  Mrs. [name] voiced understanding and was certainly agreeable to this plan, admitting that patient will definitely require more assistance than what she is able to provide.  [name], BSW, MSW, LCSW  Licensed Education officer, environmental Health System  Mailing [place] [address], [place], [place] [address] Physical Address-[address], [place], [place] [address] Toll Free Main # [phone] Fax # [phone] Cell # [phone]  Office # [phone] [name]

## 2019-05-31 ENCOUNTER — Other Ambulatory Visit: Payer: Self-pay

## 2019-05-31 ENCOUNTER — Ambulatory Visit (HOSPITAL_COMMUNITY): Payer: Medicare Other

## 2019-05-31 DIAGNOSIS — R279 Unspecified lack of coordination: Secondary | ICD-10-CM | POA: Diagnosis not present

## 2019-05-31 DIAGNOSIS — G9341 Metabolic encephalopathy: Secondary | ICD-10-CM | POA: Diagnosis not present

## 2019-05-31 DIAGNOSIS — R2689 Other abnormalities of gait and mobility: Secondary | ICD-10-CM | POA: Diagnosis not present

## 2019-05-31 DIAGNOSIS — R1312 Dysphagia, oropharyngeal phase: Secondary | ICD-10-CM | POA: Diagnosis not present

## 2019-05-31 DIAGNOSIS — M6281 Muscle weakness (generalized): Secondary | ICD-10-CM | POA: Diagnosis not present

## 2019-05-31 DIAGNOSIS — G9340 Encephalopathy, unspecified: Secondary | ICD-10-CM | POA: Diagnosis not present

## 2019-05-31 NOTE — Telephone Encounter (Signed)
I saw this patient in the office 7/20.  She was sent to [place] by Dr Rushie Nyhan for an office visit before clearance for surgery.  She was volume overloaded on exam and her labs confirmed this. I increased her diuretic and I will see her next week for clearance if she is stable. I will route this to Dr Rushie Nyhan as well.   Kerin Ransom PA-C 05/31/2019 8:10 AM

## 2019-06-01 ENCOUNTER — Ambulatory Visit (HOSPITAL_COMMUNITY)
Admission: RE | Admit: 2019-06-01 | Discharge: 2019-06-01 | Disposition: A | Discharge status: Home / Self Care | Payer: Medicare Other | Source: Ambulatory Visit | Attending: Cardiology | Admitting: Cardiology

## 2019-06-01 ENCOUNTER — Telehealth: Payer: Self-pay | Admitting: Cardiology

## 2019-06-01 DIAGNOSIS — E785 Hyperlipidemia, unspecified: Secondary | ICD-10-CM | POA: Insufficient documentation

## 2019-06-01 DIAGNOSIS — G9341 Metabolic encephalopathy: Secondary | ICD-10-CM | POA: Diagnosis not present

## 2019-06-01 DIAGNOSIS — I110 Hypertensive heart disease with heart failure: Secondary | ICD-10-CM | POA: Insufficient documentation

## 2019-06-01 DIAGNOSIS — R279 Unspecified lack of coordination: Secondary | ICD-10-CM | POA: Diagnosis not present

## 2019-06-01 DIAGNOSIS — I080 Rheumatic disorders of both mitral and aortic valves: Secondary | ICD-10-CM | POA: Diagnosis not present

## 2019-06-01 DIAGNOSIS — G9340 Encephalopathy, unspecified: Secondary | ICD-10-CM | POA: Diagnosis not present

## 2019-06-01 DIAGNOSIS — E119 Type 2 diabetes mellitus without complications: Secondary | ICD-10-CM | POA: Diagnosis not present

## 2019-06-01 DIAGNOSIS — Z86711 Personal history of pulmonary embolism: Secondary | ICD-10-CM | POA: Diagnosis not present

## 2019-06-01 DIAGNOSIS — M48061 Spinal stenosis, lumbar region without neurogenic claudication: Secondary | ICD-10-CM | POA: Diagnosis not present

## 2019-06-01 DIAGNOSIS — R2689 Other abnormalities of gait and mobility: Secondary | ICD-10-CM | POA: Diagnosis not present

## 2019-06-01 DIAGNOSIS — M6281 Muscle weakness (generalized): Secondary | ICD-10-CM | POA: Diagnosis not present

## 2019-06-01 DIAGNOSIS — I5042 Chronic combined systolic (congestive) and diastolic (congestive) heart failure: Secondary | ICD-10-CM

## 2019-06-01 DIAGNOSIS — R531 Weakness: Secondary | ICD-10-CM | POA: Diagnosis not present

## 2019-06-01 DIAGNOSIS — R1312 Dysphagia, oropharyngeal phase: Secondary | ICD-10-CM | POA: Diagnosis not present

## 2019-06-01 DIAGNOSIS — I10 Essential (primary) hypertension: Secondary | ICD-10-CM | POA: Diagnosis not present

## 2019-06-01 DIAGNOSIS — R600 Localized edema: Secondary | ICD-10-CM

## 2019-06-01 NOTE — Telephone Encounter (Signed)
Faxed over order and spoke with Buffie at [facility] to see if she received the medication changes. And will start them today. Also scheduled patients appt for next week.

## 2019-06-01 NOTE — Telephone Encounter (Signed)
New Message      Buffie said they never got the fax for orders  Please fax again to [phone]

## 2019-06-01 NOTE — Progress Notes (Signed)
  Echocardiogram 2D Echocardiogram has been performed.  Darlina Sicilian M 06/01/2019, 3:12 PM

## 2019-06-02 DIAGNOSIS — M6281 Muscle weakness (generalized): Secondary | ICD-10-CM | POA: Diagnosis not present

## 2019-06-02 DIAGNOSIS — G9341 Metabolic encephalopathy: Secondary | ICD-10-CM | POA: Diagnosis not present

## 2019-06-02 DIAGNOSIS — R1312 Dysphagia, oropharyngeal phase: Secondary | ICD-10-CM | POA: Diagnosis not present

## 2019-06-02 DIAGNOSIS — R2689 Other abnormalities of gait and mobility: Secondary | ICD-10-CM | POA: Diagnosis not present

## 2019-06-02 DIAGNOSIS — G9340 Encephalopathy, unspecified: Secondary | ICD-10-CM | POA: Diagnosis not present

## 2019-06-02 DIAGNOSIS — R279 Unspecified lack of coordination: Secondary | ICD-10-CM | POA: Diagnosis not present

## 2019-06-03 DIAGNOSIS — G9340 Encephalopathy, unspecified: Secondary | ICD-10-CM | POA: Diagnosis not present

## 2019-06-03 DIAGNOSIS — R2689 Other abnormalities of gait and mobility: Secondary | ICD-10-CM | POA: Diagnosis not present

## 2019-06-03 DIAGNOSIS — R1312 Dysphagia, oropharyngeal phase: Secondary | ICD-10-CM | POA: Diagnosis not present

## 2019-06-03 DIAGNOSIS — R279 Unspecified lack of coordination: Secondary | ICD-10-CM | POA: Diagnosis not present

## 2019-06-03 DIAGNOSIS — M6281 Muscle weakness (generalized): Secondary | ICD-10-CM | POA: Diagnosis not present

## 2019-06-03 DIAGNOSIS — G9341 Metabolic encephalopathy: Secondary | ICD-10-CM | POA: Diagnosis not present

## 2019-06-04 DIAGNOSIS — G9340 Encephalopathy, unspecified: Secondary | ICD-10-CM | POA: Diagnosis not present

## 2019-06-04 DIAGNOSIS — G9341 Metabolic encephalopathy: Secondary | ICD-10-CM | POA: Diagnosis not present

## 2019-06-04 DIAGNOSIS — R2689 Other abnormalities of gait and mobility: Secondary | ICD-10-CM | POA: Diagnosis not present

## 2019-06-04 DIAGNOSIS — M6281 Muscle weakness (generalized): Secondary | ICD-10-CM | POA: Diagnosis not present

## 2019-06-04 DIAGNOSIS — R1312 Dysphagia, oropharyngeal phase: Secondary | ICD-10-CM | POA: Diagnosis not present

## 2019-06-04 DIAGNOSIS — R279 Unspecified lack of coordination: Secondary | ICD-10-CM | POA: Diagnosis not present

## 2019-06-05 ENCOUNTER — Other Ambulatory Visit: Payer: Self-pay

## 2019-06-05 ENCOUNTER — Telehealth: Payer: Self-pay | Admitting: Cardiology

## 2019-06-05 ENCOUNTER — Ambulatory Visit (INDEPENDENT_AMBULATORY_CARE_PROVIDER_SITE_OTHER): Payer: Medicare Other | Admitting: Cardiology

## 2019-06-05 VITALS — BP 110/68 | HR 78 | Temp 97.5°F | Ht 64.0 in | Wt 184.0 lb

## 2019-06-05 DIAGNOSIS — R279 Unspecified lack of coordination: Secondary | ICD-10-CM | POA: Diagnosis not present

## 2019-06-05 DIAGNOSIS — E1122 Type 2 diabetes mellitus with diabetic chronic kidney disease: Secondary | ICD-10-CM

## 2019-06-05 DIAGNOSIS — R2689 Other abnormalities of gait and mobility: Secondary | ICD-10-CM | POA: Diagnosis not present

## 2019-06-05 DIAGNOSIS — N183 Chronic kidney disease, stage 3 unspecified: Secondary | ICD-10-CM

## 2019-06-05 DIAGNOSIS — Z01818 Encounter for other preprocedural examination: Secondary | ICD-10-CM | POA: Diagnosis not present

## 2019-06-05 DIAGNOSIS — R1312 Dysphagia, oropharyngeal phase: Secondary | ICD-10-CM | POA: Diagnosis not present

## 2019-06-05 DIAGNOSIS — G9341 Metabolic encephalopathy: Secondary | ICD-10-CM | POA: Diagnosis not present

## 2019-06-05 DIAGNOSIS — G9340 Encephalopathy, unspecified: Secondary | ICD-10-CM | POA: Diagnosis not present

## 2019-06-05 DIAGNOSIS — M6281 Muscle weakness (generalized): Secondary | ICD-10-CM | POA: Diagnosis not present

## 2019-06-05 MED ORDER — HYDRALAZINE HCL 50 MG PO TABS: 50.0000 mg | ORAL_TABLET | Freq: Three times a day (TID) | ORAL | 6 refills | Status: DC

## 2019-06-05 MED ORDER — HYDRALAZINE HCL 50 MG PO TABS: 50.0000 mg | ORAL_TABLET | Freq: Three times a day (TID) | ORAL | 3 refills | Status: DC

## 2019-06-05 NOTE — Telephone Encounter (Signed)

## 2019-06-05 NOTE — Progress Notes (Signed)
Cardiology Office Note:    Date:  06/05/2019   ID:  [name] [name], DOB 09-Nov-1930, MRN [id number]  PCP:  Helane Rima, DO  Cardiologist:  Chilton Si, MD  Electrophysiologist:  None   Referring MD: Helane Rima, DO   No chief complaint on file.   History of Present Illness:    [name] [name] is a 85 y.o. female with a hx of NICM, chronic systolic CHF, rheumatoid arthritis on steroids, type II NIDDM, and prior stroke. In July 2018 she had an ejection fraction of 20 to 25% by echo. Cardiac catheterization showed no significant coronary disease. She was treated medically and a follow-up echocardiogram in October 2019 showed her ejection fraction to be 60 to 65%. In September 2019 she had a pulmonary embolism and was placed on Xarelto. Dr. Duke Salvia saw her Mar 17, 2019. She was doing well at that time, she was seen as a preop clearance. From a cardiac standpoint she was cleared for hand surgery though it was recommended that she be crossed over with Lovenox when she came off Xarelto then. A week or so later the patient was admitted to the hospital May 20 to May 25 with mental status changes presumed to be secondary to polypharmacy and hyponatremia. She was discharged on the 25th but came back to the emergency room on May 28, brought in by the family requesting skilled nursing placement, the patient had previously declined this. On 529 she was placed in a skilled facility. She was seen as a virtual visit in June.  The patient apparently has cord compression and surgery has been suggested as early as Jan 2020 but that was put off.    The pt has gradually declined since admission to the care facility according to the patient's daughter.  The pt is now unable to stand on her own and is moved with a Nurse, adult.  The patient denies orthopnea but does have LE edema bilaterally, she is unable to get her shoes on now secondary to edema. She needs lumbar decompression and was actually  cleared for this virtually but Dr Darrelyn Hillock and the patient's daughter  felt an office visit would be more appropriate and she was seen 05/29/2019.  On exam she was clearly volume overloaded and her diuretics adjusted.  Her pro BNP was 14k.  She returns today for follow up.  Her edema is better but still present.  The patient continues to remain otherwise asymptomatic, no orthopnea or dyspnea.    Past Medical History:  Diagnosis Date  . Allergic rhinitis due to pollen 04/27/2007  . Arthritis    "in q joint" (08/07/2013)  . CHF (congestive heart failure) (HCC)   . Coronary atherosclerosis of native coronary artery   . Cough   . Edema 05/03/2013  . Exertional shortness of breath    "sometimes" (08/07/2013)  . First degree atrioventricular block   . Gout, unspecified 04/19/2013  . Hyperlipidemia 04/27/2007  . Hypertension   . Midline low back pain without sciatica 06/27/2014  . Muscle spasm   . Muscle weakness (generalized)   . Onychia and paronychia of toe   . Osteoarthrosis, unspecified whether generalized or localized, unspecified site 04/27/2007  . Other fall   . Other malaise and fatigue   . Pneumonia 05/2018  . Prepatellar bursitis   . Pulmonary embolism (HCC) 07/2018  . Rheumatic nodule 06/28/2017  . Seizures (HCC)   . Stroke (HCC) 01/17/2014  . Type II diabetes mellitus (HCC)    "fasting  90-110s" (08/07/2013)  . Unspecified vitamin D deficiency     Past Surgical History:  Procedure Laterality Date  . ABDOMINAL HYSTERECTOMY  04/1980  . APPENDECTOMY  1960  . CARDIAC CATHETERIZATION  2003  . CATARACT EXTRACTION W/ INTRAOCULAR LENS  IMPLANT, BILATERAL Bilateral ~ 2012  . CHOLECYSTECTOMY N/A 06/26/2018   Procedure: LAPAROSCOPIC CHOLECYSTECTOMY POSSIBLE INTRAOPERATIVE CHOLANGIOGRAM;  Surgeon: Abigail Miyamoto, MD;  Location: [facility] OR;  Service: General;  Laterality: N/A;  . JOINT REPLACEMENT    . REPLACEMENT TOTAL KNEE Right 09/2005  . RIGHT/LEFT HEART CATH AND CORONARY ANGIOGRAPHY  N/A 06/01/2018   Procedure: RIGHT/LEFT HEART CATH AND CORONARY ANGIOGRAPHY;  Surgeon: Corky Crafts, MD;  Location: [facility] INVASIVE CV LAB;  Service: Cardiovascular;  Laterality: N/A;  . SHOULDER OPEN ROTATOR CUFF REPAIR Right 07/1999  . TONSILLECTOMY  08/1974  . TOTAL KNEE ARTHROPLASTY Left 08/07/2013   Procedure: TOTAL KNEE ARTHROPLASTY- LEFT;  Surgeon: Dannielle Huh, MD;  Location: [facility] OR;  Service: Orthopedics;  Laterality: Left;  . TRANSTHORACIC ECHOCARDIOGRAM  2003   EF 55-65%; mild concentric LVH    Current Medications: Current Meds  Medication Sig  . acetaminophen (TYLENOL) 500 MG tablet Take 1,000 mg by mouth every 12 (twelve) hours.   Marland Kitchen albuterol (PROVENTIL HFA;VENTOLIN HFA) 108 (90 Base) MCG/ACT inhaler Inhale 1-2 puffs into the lungs every 6 (six) hours as needed for wheezing or shortness of breath.  . allopurinol (ZYLOPRIM) 100 MG tablet Take 1 tablet (100 mg total) by mouth at bedtime.  . carvedilol (COREG) 25 MG tablet Take 1 tablet (25 mg total) by mouth 2 (two) times daily with a meal.  . D3-1000 25 MCG (1000 UT) capsule Take 1 capsule (1,000 Units total) by mouth daily. (Patient taking differently: Take 1,000 Units by mouth at bedtime. )  . docusate sodium (COLACE) 100 MG capsule Take 1 capsule (100 mg total) by mouth daily as needed for mild constipation.  . ENSURE (ENSURE) Take 237 mLs by mouth every evening.  . ferrous sulfate 325 (65 FE) MG tablet Take 325 mg by mouth every other day.  . furosemide (LASIX) 40 MG tablet Take 1 tablet (40 mg total) by mouth daily.  Marland Kitchen gabapentin (NEURONTIN) 100 MG capsule Take 1 capsule (100 mg total) by mouth 3 (three) times daily.  . hydrALAZINE (APRESOLINE) 100 MG tablet Take 1 tablet (100 mg total) by mouth 3 (three) times daily.  . hydroxychloroquine (PLAQUENIL) 200 MG tablet Take 1 tablet (200 mg total) by mouth 2 (two) times daily.  . hydroxypropyl methylcellulose / hypromellose (ISOPTO TEARS / GONIOVISC) 2.5 % ophthalmic solution  Place 1 drop into both eyes daily.  . isosorbide mononitrate (IMDUR) 30 MG 24 hr tablet Take 1 tablet (30 mg total) by mouth at bedtime.  Marland Kitchen leflunomide (ARAVA) 20 MG tablet Take 1 tablet (20 mg total) by mouth daily.  . Lidocaine (ASPERCREME LIDOCAINE) 4 % PTCH Apply 1 Package topically daily.   Marland Kitchen LORazepam (ATIVAN) 0.5 MG tablet Take 0.25 mg by mouth as needed for anxiety.   . Melatonin 3 MG TABS Take 6 mg by mouth at bedtime.   . Olopatadine HCl 0.2 % SOLN Place 1 drop into both eyes daily.  . potassium chloride SA (K-DUR) 20 MEQ tablet Take 20 mEq by mouth See admin instructions. Monday and Thursday  . predniSONE (DELTASONE) 10 MG tablet Take 10 mg by mouth daily with breakfast.  . rivaroxaban (XARELTO) 20 MG TABS tablet Take 1 tablet (20 mg total) by mouth daily with  supper.  . saccharomyces boulardii (FLORASTOR) 250 MG capsule Take 250 mg by mouth every other day.  . sacubitril-valsartan (ENTRESTO) 49-51 MG Take 1 tablet by mouth daily at 12 noon.  . sitaGLIPtin (JANUVIA) 25 MG tablet Take 12.5 mg by mouth daily.      Allergies:   Clonidine derivatives, Fish allergy, Shellfish allergy, Doxycycline, Indomethacin, Lyrica [pregabalin], Methyldopa, Morphine and related, Orange fruit [citrus], Strawberry (diagnostic), Cetirizine hcl, Codeine, Levaquin [levofloxacin in d5w], and Tomato   Social History   Socioeconomic History  . Marital status: Widowed    Spouse name: Not on file  . Number of children: 3  . Years of education: 15  . Highest education level: High school graduate  Occupational History  . Not on file  Social Needs  . Financial resource strain: Not hard at all  . Food insecurity    Worry: Patient refused    Inability: Patient refused  . Transportation needs    Medical: Patient refused    Non-medical: Patient refused  Tobacco Use  . Smoking status: Never Smoker  . Smokeless tobacco: Never Used  Substance and Sexual Activity  . Alcohol use: No    Alcohol/week: 0.0  standard drinks  . Drug use: No  . Sexual activity: Not Currently  Lifestyle  . Physical activity    Days per week: 0 days    Minutes per session: 0 min  . Stress: Not at all  Relationships  . Social connections    Talks on phone: More than three times a week    Gets together: Never    Attends religious service: Never    Active member of club or organization: No    Attends meetings of clubs or organizations: Never    Relationship status: Widowed  Other Topics Concern  . Not on file  Social History Narrative   Widowed   Patient is currently unable to ambulate at all.     Family History: The patient's family history includes Heart attack in her father.  ROS:   Please see the history of present illness.     All other systems reviewed and are negative.  EKGs/Labs/Other Studies Reviewed:    The following studies were reviewed today: Echo 06/01/2019- EF 30-35%  Recent Labs: 01/12/2019: Magnesium 1.9 03/29/2019: TSH 1.521 04/05/2019: Hemoglobin 9.0; Platelets 330 04/06/2019: ALT 9; B Natriuretic Peptide 4,003.8 05/29/2019: BUN 15; Creatinine, Ser 0.86; NT-Pro BNP 14,600; Potassium 4.6; Sodium 131  Recent Lipid Panel    Component Value Date/Time   CHOL 202 (H) 06/25/2018 0607   CHOL 185 03/04/2016 1620   TRIG 67 06/25/2018 0607   HDL 63 06/25/2018 0607   HDL 73 03/04/2016 1620   CHOLHDL 3.2 06/25/2018 0607   VLDL 13 06/25/2018 0607   LDLCALC 126 (H) 06/25/2018 0607   LDLCALC 89 03/04/2016 1620    Physical Exam:    VS:  BP 110/68   Pulse 78   Temp (!) 97.5 F (36.4 C)   Ht 5\' 4"  (1.626 m)   Wt 184 lb (83.5 kg)   SpO2 94%   BMI 31.58 kg/m     Wt Readings from Last 3 Encounters:  06/05/19 184 lb (83.5 kg)  04/05/19 173 lb 8 oz (78.7 kg)  04/03/19 173 lb 8 oz (78.7 kg)     GEN:  Well nourished, well developed in no acute distress, in wheel chair HEENT: Normal NECK: No JVD; No carotid bruits LYMPHATICS: No lymphadenopathy CARDIAC: RRR, no murmurs, rubs,  gallops RESPIRATORY:  Few basilar crackles. ABDOMEN: Soft, non-tender, non-distended MUSCULOSKELETAL:  1+ pre tibial pitting edema; No deformity  SKIN: Warm and dry NEUROLOGIC:  Alert and oriented x 3 PSYCHIATRIC:  Normal affect   ASSESSMENT:    Pre-operative clearance Seen today for clearance prior to lumbar decompression  Volume overload Pt does appear volume overloaded on exam today with bilateral LE pitting edema to her knees.  Typical measures for following heart failure not available to [place] as she is totally unable to bear weight.   NICM- EF 20-25% July 2019, 60-65% Oct 2019 Non obstructive CAD July 2019  H/O PE- PE diagnosed Sept 2019- Xarelto  RA- Chronic steroids. Pt is debilitated and wheel chair bound, in care facility.  PLAN:    Still some volume overload, increase Lasix to 40 mg BID x 3 days, then resume Lasix 40 mg daily.  Check BMP and BNP in one week.  Clearance based on labs and phone to call to see how she is doing.   Medication Adjustments/Labs and Tests Ordered: Current medicines are reviewed at length with the patient today.  Concerns regarding medicines are outlined above.  No orders of the defined types were placed in this encounter.  No orders of the defined types were placed in this encounter.   Patient Instructions  Medication Instructions:  INCREASE Lasix 40mg  take 1 tablet twice a day for 3 days then take 1 tablet once a day  DECREASE Hydralazine to 50mg  take 1 tablet three times a day If you need a refill on your cardiac medications before your next appointment, please call your pharmacy.   Lab work: Your physician recommends that you return for lab work in: TODAY-BMET, BNP If you have labs (blood work) drawn today and your tests are completely normal, you will receive your results only by: Marland Kitchen MyChart Message (if you have MyChart) OR . A paper copy in the mail If you have any lab test that is abnormal or we need to change your treatment,  we will call you to review the results.  Testing/Procedures: NONE   Follow-Up: At [facility], you and your health needs are our priority.  As part of our continuing mission to provide you with exceptional heart care, we have created designated Provider Care Teams.  These Care Teams include your primary Cardiologist (physician) and Advanced Practice Providers (APPs -  Physician Assistants and Nurse Practitioners) who all work together to provide you with the care you need, when you need it. You will need a follow up appointment in 3 months.  Please call our office 2 months in advance to schedule this appointment.  You may see Chilton Si, MD or one of the following Advanced Practice Providers on your designated Care Team:   Corine Shelter, PA-C Judy Pimple, [place] . Marjie Skiff, PA-C  Any Other Special Instructions Will Be Listed Below (If Applicable). SOMEONE FROM OUR OFFICE WILL BE IN TOUCH WITH YOU ABOUT YOUR CARDIAC CLEARANCE.     Jolene Provost, PA-C  06/05/2019 4:41 PM    [facility]

## 2019-06-05 NOTE — Telephone Encounter (Signed)
New Message:    Daughter says she will need to come in with pt for her appointment. 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

## 2019-06-05 NOTE — Patient Instructions (Addendum)
Medication Instructions:  INCREASE Lasix 40mg  take 1 tablet twice a day for 3 days then take 1 tablet once a day  DECREASE Hydralazine to 50mg  take 1 tablet three times a day If you need a refill on your cardiac medications before your next appointment, please call your pharmacy.   Lab work: Your physician recommends that you return for lab work in: IN 1 WEEK (06/12/2019)-BMET, BNP If you have labs (blood work) drawn today and your tests are completely normal, you will receive your results only by: Marland Kitchen MyChart Message (if you have MyChart) OR . A paper copy in the mail If you have any lab test that is abnormal or we need to change your treatment, we will call you to review the results.  Testing/Procedures: NONE   Follow-Up: At [facility], you and your health needs are our priority.  As part of our continuing mission to provide you with exceptional heart care, we have created designated Provider Care Teams.  These Care Teams include your primary Cardiologist (physician) and Advanced Practice Providers (APPs -  Physician Assistants and Nurse Practitioners) who all work together to provide you with the care you need, when you need it. You will need a follow up appointment in 3 months.  Please call our office 2 months in advance to schedule this appointment.  You may see Skeet Latch, MD or one of the following Advanced Practice Providers on your designated Care Team:   Kerin Ransom, PA-C Roby Lofts, [place] . Sande Rives, PA-C  Any Other Special Instructions Will Be Listed Below (If Applicable). SOMEONE FROM OUR OFFICE WILL BE IN TOUCH WITH YOU ABOUT YOUR CARDIAC CLEARANCE.

## 2019-06-06 DIAGNOSIS — R279 Unspecified lack of coordination: Secondary | ICD-10-CM | POA: Diagnosis not present

## 2019-06-06 DIAGNOSIS — M6281 Muscle weakness (generalized): Secondary | ICD-10-CM | POA: Diagnosis not present

## 2019-06-06 DIAGNOSIS — R2689 Other abnormalities of gait and mobility: Secondary | ICD-10-CM | POA: Diagnosis not present

## 2019-06-06 DIAGNOSIS — G9340 Encephalopathy, unspecified: Secondary | ICD-10-CM | POA: Diagnosis not present

## 2019-06-06 DIAGNOSIS — G9341 Metabolic encephalopathy: Secondary | ICD-10-CM | POA: Diagnosis not present

## 2019-06-06 DIAGNOSIS — R1312 Dysphagia, oropharyngeal phase: Secondary | ICD-10-CM | POA: Diagnosis not present

## 2019-06-07 DIAGNOSIS — R1312 Dysphagia, oropharyngeal phase: Secondary | ICD-10-CM | POA: Diagnosis not present

## 2019-06-07 DIAGNOSIS — G9341 Metabolic encephalopathy: Secondary | ICD-10-CM | POA: Diagnosis not present

## 2019-06-07 DIAGNOSIS — M6281 Muscle weakness (generalized): Secondary | ICD-10-CM | POA: Diagnosis not present

## 2019-06-07 DIAGNOSIS — R279 Unspecified lack of coordination: Secondary | ICD-10-CM | POA: Diagnosis not present

## 2019-06-07 DIAGNOSIS — G9340 Encephalopathy, unspecified: Secondary | ICD-10-CM | POA: Diagnosis not present

## 2019-06-07 DIAGNOSIS — R2689 Other abnormalities of gait and mobility: Secondary | ICD-10-CM | POA: Diagnosis not present

## 2019-06-08 DIAGNOSIS — E1169 Type 2 diabetes mellitus with other specified complication: Secondary | ICD-10-CM | POA: Diagnosis not present

## 2019-06-08 DIAGNOSIS — G9341 Metabolic encephalopathy: Secondary | ICD-10-CM | POA: Diagnosis not present

## 2019-06-08 DIAGNOSIS — M6281 Muscle weakness (generalized): Secondary | ICD-10-CM | POA: Diagnosis not present

## 2019-06-08 DIAGNOSIS — G9340 Encephalopathy, unspecified: Secondary | ICD-10-CM | POA: Diagnosis not present

## 2019-06-08 DIAGNOSIS — E871 Hypo-osmolality and hyponatremia: Secondary | ICD-10-CM | POA: Diagnosis not present

## 2019-06-08 DIAGNOSIS — R2689 Other abnormalities of gait and mobility: Secondary | ICD-10-CM | POA: Diagnosis not present

## 2019-06-08 DIAGNOSIS — R279 Unspecified lack of coordination: Secondary | ICD-10-CM | POA: Diagnosis not present

## 2019-06-08 DIAGNOSIS — R1312 Dysphagia, oropharyngeal phase: Secondary | ICD-10-CM | POA: Diagnosis not present

## 2019-06-08 DIAGNOSIS — M069 Rheumatoid arthritis, unspecified: Secondary | ICD-10-CM | POA: Diagnosis not present

## 2019-06-09 DIAGNOSIS — G9341 Metabolic encephalopathy: Secondary | ICD-10-CM | POA: Diagnosis not present

## 2019-06-09 DIAGNOSIS — N183 Chronic kidney disease, stage 3 (moderate): Secondary | ICD-10-CM | POA: Diagnosis not present

## 2019-06-09 DIAGNOSIS — R1312 Dysphagia, oropharyngeal phase: Secondary | ICD-10-CM | POA: Diagnosis not present

## 2019-06-09 DIAGNOSIS — G9340 Encephalopathy, unspecified: Secondary | ICD-10-CM | POA: Diagnosis not present

## 2019-06-09 DIAGNOSIS — R2689 Other abnormalities of gait and mobility: Secondary | ICD-10-CM | POA: Diagnosis not present

## 2019-06-09 DIAGNOSIS — R279 Unspecified lack of coordination: Secondary | ICD-10-CM | POA: Diagnosis not present

## 2019-06-09 DIAGNOSIS — I5042 Chronic combined systolic (congestive) and diastolic (congestive) heart failure: Secondary | ICD-10-CM | POA: Diagnosis not present

## 2019-06-09 DIAGNOSIS — I130 Hypertensive heart and chronic kidney disease with heart failure and stage 1 through stage 4 chronic kidney disease, or unspecified chronic kidney disease: Secondary | ICD-10-CM | POA: Diagnosis not present

## 2019-06-09 DIAGNOSIS — I1310 Hypertensive heart and chronic kidney disease without heart failure, with stage 1 through stage 4 chronic kidney disease, or unspecified chronic kidney disease: Secondary | ICD-10-CM | POA: Diagnosis not present

## 2019-06-09 DIAGNOSIS — M6281 Muscle weakness (generalized): Secondary | ICD-10-CM | POA: Diagnosis not present

## 2019-06-10 DIAGNOSIS — M259 Joint disorder, unspecified: Secondary | ICD-10-CM | POA: Diagnosis not present

## 2019-06-10 DIAGNOSIS — M6281 Muscle weakness (generalized): Secondary | ICD-10-CM | POA: Diagnosis not present

## 2019-06-10 DIAGNOSIS — L88 Pyoderma gangrenosum: Secondary | ICD-10-CM | POA: Diagnosis not present

## 2019-06-10 DIAGNOSIS — R2689 Other abnormalities of gait and mobility: Secondary | ICD-10-CM | POA: Diagnosis not present

## 2019-06-10 DIAGNOSIS — G9341 Metabolic encephalopathy: Secondary | ICD-10-CM | POA: Diagnosis not present

## 2019-06-10 DIAGNOSIS — R278 Other lack of coordination: Secondary | ICD-10-CM | POA: Diagnosis not present

## 2019-06-10 DIAGNOSIS — I10 Essential (primary) hypertension: Secondary | ICD-10-CM | POA: Diagnosis not present

## 2019-06-10 DIAGNOSIS — G9340 Encephalopathy, unspecified: Secondary | ICD-10-CM | POA: Diagnosis not present

## 2019-06-10 DIAGNOSIS — L97212 Non-pressure chronic ulcer of right calf with fat layer exposed: Secondary | ICD-10-CM | POA: Diagnosis not present

## 2019-06-11 DIAGNOSIS — R2689 Other abnormalities of gait and mobility: Secondary | ICD-10-CM | POA: Diagnosis not present

## 2019-06-11 DIAGNOSIS — G9341 Metabolic encephalopathy: Secondary | ICD-10-CM | POA: Diagnosis not present

## 2019-06-11 DIAGNOSIS — G9340 Encephalopathy, unspecified: Secondary | ICD-10-CM | POA: Diagnosis not present

## 2019-06-11 DIAGNOSIS — R278 Other lack of coordination: Secondary | ICD-10-CM | POA: Diagnosis not present

## 2019-06-11 DIAGNOSIS — M6281 Muscle weakness (generalized): Secondary | ICD-10-CM | POA: Diagnosis not present

## 2019-06-12 DIAGNOSIS — Z8673 Personal history of transient ischemic attack (TIA), and cerebral infarction without residual deficits: Secondary | ICD-10-CM | POA: Diagnosis not present

## 2019-06-12 DIAGNOSIS — M6281 Muscle weakness (generalized): Secondary | ICD-10-CM | POA: Diagnosis not present

## 2019-06-12 DIAGNOSIS — R2689 Other abnormalities of gait and mobility: Secondary | ICD-10-CM | POA: Diagnosis not present

## 2019-06-12 DIAGNOSIS — G9341 Metabolic encephalopathy: Secondary | ICD-10-CM | POA: Diagnosis not present

## 2019-06-12 DIAGNOSIS — R278 Other lack of coordination: Secondary | ICD-10-CM | POA: Diagnosis not present

## 2019-06-12 DIAGNOSIS — I5042 Chronic combined systolic (congestive) and diastolic (congestive) heart failure: Secondary | ICD-10-CM | POA: Diagnosis not present

## 2019-06-12 DIAGNOSIS — G9340 Encephalopathy, unspecified: Secondary | ICD-10-CM | POA: Diagnosis not present

## 2019-06-12 DIAGNOSIS — M069 Rheumatoid arthritis, unspecified: Secondary | ICD-10-CM | POA: Diagnosis not present

## 2019-06-13 ENCOUNTER — Other Ambulatory Visit: Payer: Self-pay | Admitting: *Deleted

## 2019-06-13 DIAGNOSIS — R278 Other lack of coordination: Secondary | ICD-10-CM | POA: Diagnosis not present

## 2019-06-13 DIAGNOSIS — G9340 Encephalopathy, unspecified: Secondary | ICD-10-CM | POA: Diagnosis not present

## 2019-06-13 DIAGNOSIS — M6281 Muscle weakness (generalized): Secondary | ICD-10-CM | POA: Diagnosis not present

## 2019-06-13 DIAGNOSIS — R2689 Other abnormalities of gait and mobility: Secondary | ICD-10-CM | POA: Diagnosis not present

## 2019-06-13 DIAGNOSIS — G9341 Metabolic encephalopathy: Secondary | ICD-10-CM | POA: Diagnosis not present

## 2019-06-13 NOTE — Patient Outreach (Signed)
[facility]) Care Management  06/13/2019  [name] [name] 16-Jun-1930 [id number]   CSW was able to make contact with patient's daughter, [name] [name] today to follow-up regarding placement arrangements for patient at [facility] [place] where patient currently resides to receive short-term rehabilitative services.  Mrs. [name] [name] reported that patient continues to do well at [facility] and that the staff are taking good care of patient.  Unfortunately, patient is still unable to bare weight, resulting in the staff having to use a hoyer lift to transfer patient from her bed into her wheelchair.  Mrs. [name] [name] continues to report that patient is free from pain, but that patient desperately wants to be able to walk again so that she can return home to live independently.  [name] [name] admitted that she is willing to do whatever it takes to make that happen.  Mrs. [name] [name] stated that due to patient's Chronic Systolic Congestive Heart Failure, patient needed to receive a 2D Echocardiogram, scheduled for Thursday, June 01, 2019, which showed that patient has an Ejection Fraction of 30-35%.  Patient was then able to follow-up with Dr. Kerin Ransom, Cardiology Physician Assistant with [place], to review results of the 2D Echocardiogram, as well as to try and obtain medical clearance to undergo Lumbar Decompression, as a result of cord compression.  Patient was reported to be volume overloaded, with bilateral lower extremity pitting edema to her knees.  [name] Lasix was increased to 40MG  twice a day for 3 days and then 1 40MG  tablet once a day.  [name] Hydralazine was decreased to 50MG  tablet 3 times per day.      Mrs. [name] [name] indicated that they are still awaiting medical clearance for the Lumbar Decompression, but that she seriously doubts that patient will ever be stable enough for the procedure.  Mrs. [name] [name]  reported that patient will continue to reside at [facility] to receive rehabilitative services, until they find out whether or not patient has been approved or denied for the Lumbar Decompression.  Per [name] [name]'s request, CSW agreed to follow-up with her again in a few weeks, on Tuesday, June 27, 2019, around 9:00AM.  Mrs. [name] [name] assured CSW that she would make an outreach call to CSW if anything transpires in the meantime.  CSW will also continue to review patient's EMR (Electronic Medical Record) in Epic to stay abreast of patient's medical status.  [name], BSW, MSW, LCSW  Licensed Education officer, environmental Health System  Mailing [place] [address] [place], [place] [address] Physical Address-[address], [place], [place] [address] Toll Free Main # [phone] Fax # [phone] Cell # [phone]  Office # [phone] [name]

## 2019-06-14 DIAGNOSIS — R2689 Other abnormalities of gait and mobility: Secondary | ICD-10-CM | POA: Diagnosis not present

## 2019-06-14 DIAGNOSIS — M6281 Muscle weakness (generalized): Secondary | ICD-10-CM | POA: Diagnosis not present

## 2019-06-14 DIAGNOSIS — G9341 Metabolic encephalopathy: Secondary | ICD-10-CM | POA: Diagnosis not present

## 2019-06-14 DIAGNOSIS — G9340 Encephalopathy, unspecified: Secondary | ICD-10-CM | POA: Diagnosis not present

## 2019-06-14 DIAGNOSIS — R278 Other lack of coordination: Secondary | ICD-10-CM | POA: Diagnosis not present

## 2019-06-15 DIAGNOSIS — G9341 Metabolic encephalopathy: Secondary | ICD-10-CM | POA: Diagnosis not present

## 2019-06-15 DIAGNOSIS — M6281 Muscle weakness (generalized): Secondary | ICD-10-CM | POA: Diagnosis not present

## 2019-06-15 DIAGNOSIS — R278 Other lack of coordination: Secondary | ICD-10-CM | POA: Diagnosis not present

## 2019-06-15 DIAGNOSIS — R2689 Other abnormalities of gait and mobility: Secondary | ICD-10-CM | POA: Diagnosis not present

## 2019-06-15 DIAGNOSIS — G9340 Encephalopathy, unspecified: Secondary | ICD-10-CM | POA: Diagnosis not present

## 2019-06-16 DIAGNOSIS — R278 Other lack of coordination: Secondary | ICD-10-CM | POA: Diagnosis not present

## 2019-06-16 DIAGNOSIS — G9340 Encephalopathy, unspecified: Secondary | ICD-10-CM | POA: Diagnosis not present

## 2019-06-16 DIAGNOSIS — R2689 Other abnormalities of gait and mobility: Secondary | ICD-10-CM | POA: Diagnosis not present

## 2019-06-16 DIAGNOSIS — G9341 Metabolic encephalopathy: Secondary | ICD-10-CM | POA: Diagnosis not present

## 2019-06-16 DIAGNOSIS — M6281 Muscle weakness (generalized): Secondary | ICD-10-CM | POA: Diagnosis not present

## 2019-06-17 DIAGNOSIS — R2689 Other abnormalities of gait and mobility: Secondary | ICD-10-CM | POA: Diagnosis not present

## 2019-06-17 DIAGNOSIS — G9340 Encephalopathy, unspecified: Secondary | ICD-10-CM | POA: Diagnosis not present

## 2019-06-17 DIAGNOSIS — M6281 Muscle weakness (generalized): Secondary | ICD-10-CM | POA: Diagnosis not present

## 2019-06-17 DIAGNOSIS — G9341 Metabolic encephalopathy: Secondary | ICD-10-CM | POA: Diagnosis not present

## 2019-06-17 DIAGNOSIS — R278 Other lack of coordination: Secondary | ICD-10-CM | POA: Diagnosis not present

## 2019-06-18 DIAGNOSIS — G9341 Metabolic encephalopathy: Secondary | ICD-10-CM | POA: Diagnosis not present

## 2019-06-18 DIAGNOSIS — G9340 Encephalopathy, unspecified: Secondary | ICD-10-CM | POA: Diagnosis not present

## 2019-06-18 DIAGNOSIS — R2689 Other abnormalities of gait and mobility: Secondary | ICD-10-CM | POA: Diagnosis not present

## 2019-06-18 DIAGNOSIS — R278 Other lack of coordination: Secondary | ICD-10-CM | POA: Diagnosis not present

## 2019-06-18 DIAGNOSIS — M6281 Muscle weakness (generalized): Secondary | ICD-10-CM | POA: Diagnosis not present

## 2019-06-19 DIAGNOSIS — G9341 Metabolic encephalopathy: Secondary | ICD-10-CM | POA: Diagnosis not present

## 2019-06-19 DIAGNOSIS — R278 Other lack of coordination: Secondary | ICD-10-CM | POA: Diagnosis not present

## 2019-06-19 DIAGNOSIS — G9340 Encephalopathy, unspecified: Secondary | ICD-10-CM | POA: Diagnosis not present

## 2019-06-19 DIAGNOSIS — M6281 Muscle weakness (generalized): Secondary | ICD-10-CM | POA: Diagnosis not present

## 2019-06-19 DIAGNOSIS — R2689 Other abnormalities of gait and mobility: Secondary | ICD-10-CM | POA: Diagnosis not present

## 2019-06-20 DIAGNOSIS — R2689 Other abnormalities of gait and mobility: Secondary | ICD-10-CM | POA: Diagnosis not present

## 2019-06-20 DIAGNOSIS — G9340 Encephalopathy, unspecified: Secondary | ICD-10-CM | POA: Diagnosis not present

## 2019-06-20 DIAGNOSIS — M6281 Muscle weakness (generalized): Secondary | ICD-10-CM | POA: Diagnosis not present

## 2019-06-20 DIAGNOSIS — R278 Other lack of coordination: Secondary | ICD-10-CM | POA: Diagnosis not present

## 2019-06-20 DIAGNOSIS — G9341 Metabolic encephalopathy: Secondary | ICD-10-CM | POA: Diagnosis not present

## 2019-06-21 DIAGNOSIS — R278 Other lack of coordination: Secondary | ICD-10-CM | POA: Diagnosis not present

## 2019-06-21 DIAGNOSIS — M6281 Muscle weakness (generalized): Secondary | ICD-10-CM | POA: Diagnosis not present

## 2019-06-21 DIAGNOSIS — R2689 Other abnormalities of gait and mobility: Secondary | ICD-10-CM | POA: Diagnosis not present

## 2019-06-21 DIAGNOSIS — G9340 Encephalopathy, unspecified: Secondary | ICD-10-CM | POA: Diagnosis not present

## 2019-06-21 DIAGNOSIS — G9341 Metabolic encephalopathy: Secondary | ICD-10-CM | POA: Diagnosis not present

## 2019-06-22 DIAGNOSIS — R2689 Other abnormalities of gait and mobility: Secondary | ICD-10-CM | POA: Diagnosis not present

## 2019-06-22 DIAGNOSIS — G9341 Metabolic encephalopathy: Secondary | ICD-10-CM | POA: Diagnosis not present

## 2019-06-22 DIAGNOSIS — M6281 Muscle weakness (generalized): Secondary | ICD-10-CM | POA: Diagnosis not present

## 2019-06-22 DIAGNOSIS — G9340 Encephalopathy, unspecified: Secondary | ICD-10-CM | POA: Diagnosis not present

## 2019-06-22 DIAGNOSIS — R278 Other lack of coordination: Secondary | ICD-10-CM | POA: Diagnosis not present

## 2019-06-23 DIAGNOSIS — G9341 Metabolic encephalopathy: Secondary | ICD-10-CM | POA: Diagnosis not present

## 2019-06-23 DIAGNOSIS — G9340 Encephalopathy, unspecified: Secondary | ICD-10-CM | POA: Diagnosis not present

## 2019-06-23 DIAGNOSIS — R278 Other lack of coordination: Secondary | ICD-10-CM | POA: Diagnosis not present

## 2019-06-23 DIAGNOSIS — M6281 Muscle weakness (generalized): Secondary | ICD-10-CM | POA: Diagnosis not present

## 2019-06-23 DIAGNOSIS — R2689 Other abnormalities of gait and mobility: Secondary | ICD-10-CM | POA: Diagnosis not present

## 2019-06-25 DIAGNOSIS — M6281 Muscle weakness (generalized): Secondary | ICD-10-CM | POA: Diagnosis not present

## 2019-06-25 DIAGNOSIS — R278 Other lack of coordination: Secondary | ICD-10-CM | POA: Diagnosis not present

## 2019-06-25 DIAGNOSIS — G9341 Metabolic encephalopathy: Secondary | ICD-10-CM | POA: Diagnosis not present

## 2019-06-25 DIAGNOSIS — G9340 Encephalopathy, unspecified: Secondary | ICD-10-CM | POA: Diagnosis not present

## 2019-06-25 DIAGNOSIS — R2689 Other abnormalities of gait and mobility: Secondary | ICD-10-CM | POA: Diagnosis not present

## 2019-06-27 ENCOUNTER — Other Ambulatory Visit: Payer: Self-pay | Admitting: *Deleted

## 2019-06-27 DIAGNOSIS — R2689 Other abnormalities of gait and mobility: Secondary | ICD-10-CM | POA: Diagnosis not present

## 2019-06-27 DIAGNOSIS — G9340 Encephalopathy, unspecified: Secondary | ICD-10-CM | POA: Diagnosis not present

## 2019-06-27 DIAGNOSIS — R278 Other lack of coordination: Secondary | ICD-10-CM | POA: Diagnosis not present

## 2019-06-27 DIAGNOSIS — M6281 Muscle weakness (generalized): Secondary | ICD-10-CM | POA: Diagnosis not present

## 2019-06-27 DIAGNOSIS — G9341 Metabolic encephalopathy: Secondary | ICD-10-CM | POA: Diagnosis not present

## 2019-06-27 NOTE — Patient Outreach (Signed)
[place] [facility]) Care Management  06/27/2019  [name] [name] 03-08-1930 [id number]   CSW made an attempt to try and contact patient's daughter, Excell Seltzer today to follow-up regarding placement arrangements for patient at [facility] [place] where patient currently resides to receive short-term rehabilitative services; however, Mrs. [name] was not available at the time of CSW's call.  CSW left a HIPAA compliant message on voicemail for Mrs. [name] and is currently awaiting a return call.  CSW will make a second outreach attempt to Mrs. Hampton within the next 3-4 business days, if a return call is not received from Mrs. Hampton in the meantime.  [name], BSW, MSW, LCSW  Licensed Education officer, environmental Health System  Mailing [place] [address] [place], [place] [address] Physical Address[address], [place], [place] [address] Toll Free Main # [phone] Fax # [phone] Cell # [phone]  Office # [phone] [name]

## 2019-06-28 DIAGNOSIS — M48061 Spinal stenosis, lumbar region without neurogenic claudication: Secondary | ICD-10-CM | POA: Diagnosis not present

## 2019-06-28 DIAGNOSIS — I5042 Chronic combined systolic (congestive) and diastolic (congestive) heart failure: Secondary | ICD-10-CM | POA: Diagnosis not present

## 2019-06-28 DIAGNOSIS — G9341 Metabolic encephalopathy: Secondary | ICD-10-CM | POA: Diagnosis not present

## 2019-06-28 DIAGNOSIS — R2689 Other abnormalities of gait and mobility: Secondary | ICD-10-CM | POA: Diagnosis not present

## 2019-06-28 DIAGNOSIS — N183 Chronic kidney disease, stage 3 (moderate): Secondary | ICD-10-CM | POA: Diagnosis not present

## 2019-06-28 DIAGNOSIS — M6281 Muscle weakness (generalized): Secondary | ICD-10-CM | POA: Diagnosis not present

## 2019-06-28 DIAGNOSIS — I130 Hypertensive heart and chronic kidney disease with heart failure and stage 1 through stage 4 chronic kidney disease, or unspecified chronic kidney disease: Secondary | ICD-10-CM | POA: Diagnosis not present

## 2019-06-28 DIAGNOSIS — G9340 Encephalopathy, unspecified: Secondary | ICD-10-CM | POA: Diagnosis not present

## 2019-06-28 DIAGNOSIS — R278 Other lack of coordination: Secondary | ICD-10-CM | POA: Diagnosis not present

## 2019-06-29 DIAGNOSIS — D638 Anemia in other chronic diseases classified elsewhere: Secondary | ICD-10-CM | POA: Diagnosis not present

## 2019-06-29 DIAGNOSIS — G9341 Metabolic encephalopathy: Secondary | ICD-10-CM | POA: Diagnosis not present

## 2019-06-29 DIAGNOSIS — M6281 Muscle weakness (generalized): Secondary | ICD-10-CM | POA: Diagnosis not present

## 2019-06-29 DIAGNOSIS — G9340 Encephalopathy, unspecified: Secondary | ICD-10-CM | POA: Diagnosis not present

## 2019-06-29 DIAGNOSIS — R278 Other lack of coordination: Secondary | ICD-10-CM | POA: Diagnosis not present

## 2019-06-29 DIAGNOSIS — E1169 Type 2 diabetes mellitus with other specified complication: Secondary | ICD-10-CM | POA: Diagnosis not present

## 2019-06-29 DIAGNOSIS — R2689 Other abnormalities of gait and mobility: Secondary | ICD-10-CM | POA: Diagnosis not present

## 2019-06-29 DIAGNOSIS — I5042 Chronic combined systolic (congestive) and diastolic (congestive) heart failure: Secondary | ICD-10-CM | POA: Diagnosis not present

## 2019-06-30 DIAGNOSIS — G9341 Metabolic encephalopathy: Secondary | ICD-10-CM | POA: Diagnosis not present

## 2019-06-30 DIAGNOSIS — I130 Hypertensive heart and chronic kidney disease with heart failure and stage 1 through stage 4 chronic kidney disease, or unspecified chronic kidney disease: Secondary | ICD-10-CM | POA: Diagnosis not present

## 2019-06-30 DIAGNOSIS — R2689 Other abnormalities of gait and mobility: Secondary | ICD-10-CM | POA: Diagnosis not present

## 2019-06-30 DIAGNOSIS — R278 Other lack of coordination: Secondary | ICD-10-CM | POA: Diagnosis not present

## 2019-06-30 DIAGNOSIS — G9340 Encephalopathy, unspecified: Secondary | ICD-10-CM | POA: Diagnosis not present

## 2019-06-30 DIAGNOSIS — N183 Chronic kidney disease, stage 3 (moderate): Secondary | ICD-10-CM | POA: Diagnosis not present

## 2019-06-30 DIAGNOSIS — M6281 Muscle weakness (generalized): Secondary | ICD-10-CM | POA: Diagnosis not present

## 2019-06-30 DIAGNOSIS — D649 Anemia, unspecified: Secondary | ICD-10-CM | POA: Diagnosis not present

## 2019-07-02 DIAGNOSIS — R2689 Other abnormalities of gait and mobility: Secondary | ICD-10-CM | POA: Diagnosis not present

## 2019-07-02 DIAGNOSIS — M6281 Muscle weakness (generalized): Secondary | ICD-10-CM | POA: Diagnosis not present

## 2019-07-02 DIAGNOSIS — G9340 Encephalopathy, unspecified: Secondary | ICD-10-CM | POA: Diagnosis not present

## 2019-07-02 DIAGNOSIS — R278 Other lack of coordination: Secondary | ICD-10-CM | POA: Diagnosis not present

## 2019-07-02 DIAGNOSIS — G9341 Metabolic encephalopathy: Secondary | ICD-10-CM | POA: Diagnosis not present

## 2019-07-03 ENCOUNTER — Other Ambulatory Visit: Payer: Self-pay | Admitting: *Deleted

## 2019-07-03 ENCOUNTER — Encounter: Payer: Self-pay | Admitting: *Deleted

## 2019-07-03 DIAGNOSIS — G9340 Encephalopathy, unspecified: Secondary | ICD-10-CM | POA: Diagnosis not present

## 2019-07-03 DIAGNOSIS — M6281 Muscle weakness (generalized): Secondary | ICD-10-CM | POA: Diagnosis not present

## 2019-07-03 DIAGNOSIS — G9341 Metabolic encephalopathy: Secondary | ICD-10-CM | POA: Diagnosis not present

## 2019-07-03 DIAGNOSIS — R2689 Other abnormalities of gait and mobility: Secondary | ICD-10-CM | POA: Diagnosis not present

## 2019-07-03 DIAGNOSIS — R278 Other lack of coordination: Secondary | ICD-10-CM | POA: Diagnosis not present

## 2019-07-03 NOTE — Patient Outreach (Signed)
[facility]) Care Management  07/03/2019  [name] [name] [birth year] [id number]   CSW made a second attempt to try and contact patient's daughter, Excell Seltzer today to follow-up regarding social work services and resources for patient; however, Mrs. [name] was not available at the time of CSW's call.  A HIPAA compliant message was left for Mrs. Hampton on voicemail.  CSW continues to await a return call.  CSW will make a third and final outreach attempt within the next 3-4 business days, if a return call is not received from Mrs. Hampton in the meantime.  CSW will then proceed with case closure, if a return call is not received from Mrs. Hampton within a total of 10 business days, as required number of phone attempts will have been made and outreach letter mailed.   [name], BSW, MSW, LCSW  Licensed Education officer, environmental Health System  Mailing [place] [address] [place], [place] [address] Physical Address-[address], [place], [place] [address] Toll Free Main # [phone] Fax # [phone] Cell # [phone]  Office # [phone] [name]

## 2019-07-04 DIAGNOSIS — I5042 Chronic combined systolic (congestive) and diastolic (congestive) heart failure: Secondary | ICD-10-CM | POA: Diagnosis not present

## 2019-07-04 DIAGNOSIS — G9341 Metabolic encephalopathy: Secondary | ICD-10-CM | POA: Diagnosis not present

## 2019-07-04 DIAGNOSIS — G9340 Encephalopathy, unspecified: Secondary | ICD-10-CM | POA: Diagnosis not present

## 2019-07-04 DIAGNOSIS — I130 Hypertensive heart and chronic kidney disease with heart failure and stage 1 through stage 4 chronic kidney disease, or unspecified chronic kidney disease: Secondary | ICD-10-CM | POA: Diagnosis not present

## 2019-07-04 DIAGNOSIS — M6281 Muscle weakness (generalized): Secondary | ICD-10-CM | POA: Diagnosis not present

## 2019-07-04 DIAGNOSIS — R2689 Other abnormalities of gait and mobility: Secondary | ICD-10-CM | POA: Diagnosis not present

## 2019-07-04 DIAGNOSIS — R278 Other lack of coordination: Secondary | ICD-10-CM | POA: Diagnosis not present

## 2019-07-04 DIAGNOSIS — N183 Chronic kidney disease, stage 3 (moderate): Secondary | ICD-10-CM | POA: Diagnosis not present

## 2019-07-05 DIAGNOSIS — R278 Other lack of coordination: Secondary | ICD-10-CM | POA: Diagnosis not present

## 2019-07-05 DIAGNOSIS — G9341 Metabolic encephalopathy: Secondary | ICD-10-CM | POA: Diagnosis not present

## 2019-07-05 DIAGNOSIS — G9340 Encephalopathy, unspecified: Secondary | ICD-10-CM | POA: Diagnosis not present

## 2019-07-05 DIAGNOSIS — M6281 Muscle weakness (generalized): Secondary | ICD-10-CM | POA: Diagnosis not present

## 2019-07-05 DIAGNOSIS — R2689 Other abnormalities of gait and mobility: Secondary | ICD-10-CM | POA: Diagnosis not present

## 2019-07-06 DIAGNOSIS — R278 Other lack of coordination: Secondary | ICD-10-CM | POA: Diagnosis not present

## 2019-07-06 DIAGNOSIS — G9341 Metabolic encephalopathy: Secondary | ICD-10-CM | POA: Diagnosis not present

## 2019-07-06 DIAGNOSIS — G9340 Encephalopathy, unspecified: Secondary | ICD-10-CM | POA: Diagnosis not present

## 2019-07-06 DIAGNOSIS — R2689 Other abnormalities of gait and mobility: Secondary | ICD-10-CM | POA: Diagnosis not present

## 2019-07-06 DIAGNOSIS — M6281 Muscle weakness (generalized): Secondary | ICD-10-CM | POA: Diagnosis not present

## 2019-07-07 ENCOUNTER — Other Ambulatory Visit: Payer: Self-pay | Admitting: *Deleted

## 2019-07-07 DIAGNOSIS — N183 Chronic kidney disease, stage 3 (moderate): Secondary | ICD-10-CM | POA: Diagnosis not present

## 2019-07-07 DIAGNOSIS — I130 Hypertensive heart and chronic kidney disease with heart failure and stage 1 through stage 4 chronic kidney disease, or unspecified chronic kidney disease: Secondary | ICD-10-CM | POA: Diagnosis not present

## 2019-07-07 DIAGNOSIS — I5042 Chronic combined systolic (congestive) and diastolic (congestive) heart failure: Secondary | ICD-10-CM | POA: Diagnosis not present

## 2019-07-07 NOTE — Patient Outreach (Signed)
[facility]) Care Management  07/07/2019  [name] [name] [birth year] [id number]   CSW was able to make contact with patient's daughter, [name] [name] today to follow-up regarding long-term care placement arrangements for patient.  Mrs. [name] reported that patient continues to reside at [facility] [place], to receive rehabilitative services.  However, Mrs. [name] admitted that her plan is to try and get patient back to her own home, so that patient can live independently.  Mrs. [name] is aware that she will need to hire 24 hour caregivers, 7 days per week, so she is currently in the process of reapplying for Long-Term Care Medicaid for patient, through the [place].    Mrs. [name] went on to say that she has already made arrangements for patient to have a [employer] in her bedroom, above her bed, to assist with lifting patient in and out of bed, as patient is still unable to stand independently or walk without maximum assistance.  Mrs. [name] reported that the Veterinary surgeon at [facility] is making all the appropriate discharge arrangements for patient (I.e assisting with completion of the Long-Term Care Medicaid application, arranging home health services through a home health agency of choice, ordering durable medical equipment and coordinating 24 hour caregivers).    Mrs. [name] was unable to identify any social work specific needs from [place], at present, but agreed to contact CSW if social work needs arise in the near future.  CSW agreed to follow-up with Mrs. [name] again next week, on Friday, July 14, 2019, around 9:00AM, to further assess patient's needs.  Mrs. [name] is hoping that she will have a tentative discharge date set for patient by then, agreeing to contact [place] prior to Friday, if a discharge date is determined  between now and then.  CSW encouraged Mrs. [name] to have the Veterinary surgeon at [facility] contact CSW if CSW can be of assistance with regards to patient's discharge planning needs.  [name], BSW, MSW, LCSW  Licensed Education officer, environmental Health System  Mailing [place] [address], [place], [place] [address] Physical Address-[address], [place], [place] [address] Toll Free Main # [phone] Fax # [phone] Cell # [phone]  Office # [phone] [name]

## 2019-07-10 DIAGNOSIS — G9341 Metabolic encephalopathy: Secondary | ICD-10-CM | POA: Diagnosis not present

## 2019-07-10 DIAGNOSIS — M48061 Spinal stenosis, lumbar region without neurogenic claudication: Secondary | ICD-10-CM | POA: Diagnosis not present

## 2019-07-10 DIAGNOSIS — M6281 Muscle weakness (generalized): Secondary | ICD-10-CM | POA: Diagnosis not present

## 2019-07-10 DIAGNOSIS — R2689 Other abnormalities of gait and mobility: Secondary | ICD-10-CM | POA: Diagnosis not present

## 2019-07-10 DIAGNOSIS — R278 Other lack of coordination: Secondary | ICD-10-CM | POA: Diagnosis not present

## 2019-07-10 DIAGNOSIS — I5042 Chronic combined systolic (congestive) and diastolic (congestive) heart failure: Secondary | ICD-10-CM | POA: Diagnosis not present

## 2019-07-10 DIAGNOSIS — G9340 Encephalopathy, unspecified: Secondary | ICD-10-CM | POA: Diagnosis not present

## 2019-07-10 DIAGNOSIS — Z7952 Long term (current) use of systemic steroids: Secondary | ICD-10-CM | POA: Diagnosis not present

## 2019-07-10 DIAGNOSIS — M069 Rheumatoid arthritis, unspecified: Secondary | ICD-10-CM | POA: Diagnosis not present

## 2019-07-11 DIAGNOSIS — M259 Joint disorder, unspecified: Secondary | ICD-10-CM | POA: Diagnosis not present

## 2019-07-11 DIAGNOSIS — I10 Essential (primary) hypertension: Secondary | ICD-10-CM | POA: Diagnosis not present

## 2019-07-11 DIAGNOSIS — M1990 Unspecified osteoarthritis, unspecified site: Secondary | ICD-10-CM | POA: Diagnosis not present

## 2019-07-11 DIAGNOSIS — L88 Pyoderma gangrenosum: Secondary | ICD-10-CM | POA: Diagnosis not present

## 2019-07-11 DIAGNOSIS — R2689 Other abnormalities of gait and mobility: Secondary | ICD-10-CM | POA: Diagnosis not present

## 2019-07-11 DIAGNOSIS — M6281 Muscle weakness (generalized): Secondary | ICD-10-CM | POA: Diagnosis not present

## 2019-07-11 DIAGNOSIS — M25519 Pain in unspecified shoulder: Secondary | ICD-10-CM | POA: Diagnosis not present

## 2019-07-11 DIAGNOSIS — L97212 Non-pressure chronic ulcer of right calf with fat layer exposed: Secondary | ICD-10-CM | POA: Diagnosis not present

## 2019-07-11 DIAGNOSIS — M0589 Other rheumatoid arthritis with rheumatoid factor of multiple sites: Secondary | ICD-10-CM | POA: Diagnosis not present

## 2019-07-11 DIAGNOSIS — G9341 Metabolic encephalopathy: Secondary | ICD-10-CM | POA: Diagnosis not present

## 2019-07-12 DIAGNOSIS — R262 Difficulty in walking, not elsewhere classified: Secondary | ICD-10-CM | POA: Diagnosis not present

## 2019-07-12 DIAGNOSIS — M6281 Muscle weakness (generalized): Secondary | ICD-10-CM | POA: Diagnosis not present

## 2019-07-12 DIAGNOSIS — M1990 Unspecified osteoarthritis, unspecified site: Secondary | ICD-10-CM | POA: Diagnosis not present

## 2019-07-12 DIAGNOSIS — B351 Tinea unguium: Secondary | ICD-10-CM | POA: Diagnosis not present

## 2019-07-12 DIAGNOSIS — M0589 Other rheumatoid arthritis with rheumatoid factor of multiple sites: Secondary | ICD-10-CM | POA: Diagnosis not present

## 2019-07-12 DIAGNOSIS — R42 Dizziness and giddiness: Secondary | ICD-10-CM | POA: Diagnosis not present

## 2019-07-12 DIAGNOSIS — I739 Peripheral vascular disease, unspecified: Secondary | ICD-10-CM | POA: Diagnosis not present

## 2019-07-12 DIAGNOSIS — I130 Hypertensive heart and chronic kidney disease with heart failure and stage 1 through stage 4 chronic kidney disease, or unspecified chronic kidney disease: Secondary | ICD-10-CM | POA: Diagnosis not present

## 2019-07-12 DIAGNOSIS — G9341 Metabolic encephalopathy: Secondary | ICD-10-CM | POA: Diagnosis not present

## 2019-07-12 DIAGNOSIS — R2689 Other abnormalities of gait and mobility: Secondary | ICD-10-CM | POA: Diagnosis not present

## 2019-07-12 DIAGNOSIS — M25519 Pain in unspecified shoulder: Secondary | ICD-10-CM | POA: Diagnosis not present

## 2019-07-12 DIAGNOSIS — L853 Xerosis cutis: Secondary | ICD-10-CM | POA: Diagnosis not present

## 2019-07-12 DIAGNOSIS — H538 Other visual disturbances: Secondary | ICD-10-CM | POA: Diagnosis not present

## 2019-07-12 DIAGNOSIS — E1151 Type 2 diabetes mellitus with diabetic peripheral angiopathy without gangrene: Secondary | ICD-10-CM | POA: Diagnosis not present

## 2019-07-13 DIAGNOSIS — M25511 Pain in right shoulder: Secondary | ICD-10-CM | POA: Diagnosis not present

## 2019-07-13 DIAGNOSIS — Z79899 Other long term (current) drug therapy: Secondary | ICD-10-CM | POA: Diagnosis not present

## 2019-07-14 ENCOUNTER — Other Ambulatory Visit: Payer: Self-pay | Admitting: *Deleted

## 2019-07-14 DIAGNOSIS — M25519 Pain in unspecified shoulder: Secondary | ICD-10-CM | POA: Diagnosis not present

## 2019-07-14 DIAGNOSIS — R42 Dizziness and giddiness: Secondary | ICD-10-CM | POA: Diagnosis not present

## 2019-07-14 DIAGNOSIS — M6281 Muscle weakness (generalized): Secondary | ICD-10-CM | POA: Diagnosis not present

## 2019-07-14 DIAGNOSIS — M1990 Unspecified osteoarthritis, unspecified site: Secondary | ICD-10-CM | POA: Diagnosis not present

## 2019-07-14 DIAGNOSIS — H538 Other visual disturbances: Secondary | ICD-10-CM | POA: Diagnosis not present

## 2019-07-14 DIAGNOSIS — M0589 Other rheumatoid arthritis with rheumatoid factor of multiple sites: Secondary | ICD-10-CM | POA: Diagnosis not present

## 2019-07-14 DIAGNOSIS — R2689 Other abnormalities of gait and mobility: Secondary | ICD-10-CM | POA: Diagnosis not present

## 2019-07-14 DIAGNOSIS — G9341 Metabolic encephalopathy: Secondary | ICD-10-CM | POA: Diagnosis not present

## 2019-07-14 DIAGNOSIS — N183 Chronic kidney disease, stage 3 (moderate): Secondary | ICD-10-CM | POA: Diagnosis not present

## 2019-07-14 DIAGNOSIS — D649 Anemia, unspecified: Secondary | ICD-10-CM | POA: Diagnosis not present

## 2019-07-14 NOTE — Patient Outreach (Signed)
[facility]) Care Management  07/14/2019  [name] [name] 1930-07-15 [id number]   CSW made an attempt to try and contact patient's daughter, [name] [name] today to follow-up regarding long-term care placement arrangements for patient versus home with home health services and 24 hour care and supervision; however, Mrs. [name] [name] was not available at the time of CSW's call.  CSW left a HIPAA compliant message on voicemail for Mrs. [name] [name] and is currently awaiting a return call.  CSW will make a second outreach attempt, within the next 3-4 business days, if a return call is not received from Mrs. Hampton in the meantime.  [name], BSW, MSW, LCSW  Licensed Education officer, environmental Health System  Mailing [place] [address] [place], [place] [address] Physical Address-[address], [place], [place] [address] Toll Free Main # [phone] Fax # [phone] Cell # [phone]  Office # [phone] [name]

## 2019-07-17 DIAGNOSIS — M0589 Other rheumatoid arthritis with rheumatoid factor of multiple sites: Secondary | ICD-10-CM | POA: Diagnosis not present

## 2019-07-17 DIAGNOSIS — M1990 Unspecified osteoarthritis, unspecified site: Secondary | ICD-10-CM | POA: Diagnosis not present

## 2019-07-17 DIAGNOSIS — R2689 Other abnormalities of gait and mobility: Secondary | ICD-10-CM | POA: Diagnosis not present

## 2019-07-17 DIAGNOSIS — M25519 Pain in unspecified shoulder: Secondary | ICD-10-CM | POA: Diagnosis not present

## 2019-07-17 DIAGNOSIS — G9341 Metabolic encephalopathy: Secondary | ICD-10-CM | POA: Diagnosis not present

## 2019-07-17 DIAGNOSIS — M6281 Muscle weakness (generalized): Secondary | ICD-10-CM | POA: Diagnosis not present

## 2019-07-18 DIAGNOSIS — M1990 Unspecified osteoarthritis, unspecified site: Secondary | ICD-10-CM | POA: Diagnosis not present

## 2019-07-18 DIAGNOSIS — M6281 Muscle weakness (generalized): Secondary | ICD-10-CM | POA: Diagnosis not present

## 2019-07-18 DIAGNOSIS — M25519 Pain in unspecified shoulder: Secondary | ICD-10-CM | POA: Diagnosis not present

## 2019-07-18 DIAGNOSIS — M0589 Other rheumatoid arthritis with rheumatoid factor of multiple sites: Secondary | ICD-10-CM | POA: Diagnosis not present

## 2019-07-18 DIAGNOSIS — R2689 Other abnormalities of gait and mobility: Secondary | ICD-10-CM | POA: Diagnosis not present

## 2019-07-18 DIAGNOSIS — G9341 Metabolic encephalopathy: Secondary | ICD-10-CM | POA: Diagnosis not present

## 2019-07-19 DIAGNOSIS — G9341 Metabolic encephalopathy: Secondary | ICD-10-CM | POA: Diagnosis not present

## 2019-07-19 DIAGNOSIS — Z79899 Other long term (current) drug therapy: Secondary | ICD-10-CM | POA: Diagnosis not present

## 2019-07-19 DIAGNOSIS — M25519 Pain in unspecified shoulder: Secondary | ICD-10-CM | POA: Diagnosis not present

## 2019-07-19 DIAGNOSIS — R2689 Other abnormalities of gait and mobility: Secondary | ICD-10-CM | POA: Diagnosis not present

## 2019-07-19 DIAGNOSIS — M0589 Other rheumatoid arthritis with rheumatoid factor of multiple sites: Secondary | ICD-10-CM | POA: Diagnosis not present

## 2019-07-19 DIAGNOSIS — M1990 Unspecified osteoarthritis, unspecified site: Secondary | ICD-10-CM | POA: Diagnosis not present

## 2019-07-19 DIAGNOSIS — M6281 Muscle weakness (generalized): Secondary | ICD-10-CM | POA: Diagnosis not present

## 2019-07-20 DIAGNOSIS — R2689 Other abnormalities of gait and mobility: Secondary | ICD-10-CM | POA: Diagnosis not present

## 2019-07-20 DIAGNOSIS — M1990 Unspecified osteoarthritis, unspecified site: Secondary | ICD-10-CM | POA: Diagnosis not present

## 2019-07-20 DIAGNOSIS — G9341 Metabolic encephalopathy: Secondary | ICD-10-CM | POA: Diagnosis not present

## 2019-07-20 DIAGNOSIS — M6281 Muscle weakness (generalized): Secondary | ICD-10-CM | POA: Diagnosis not present

## 2019-07-20 DIAGNOSIS — M25519 Pain in unspecified shoulder: Secondary | ICD-10-CM | POA: Diagnosis not present

## 2019-07-20 DIAGNOSIS — M0589 Other rheumatoid arthritis with rheumatoid factor of multiple sites: Secondary | ICD-10-CM | POA: Diagnosis not present

## 2019-07-21 ENCOUNTER — Encounter: Payer: Self-pay | Admitting: *Deleted

## 2019-07-21 ENCOUNTER — Other Ambulatory Visit: Payer: Self-pay | Admitting: *Deleted

## 2019-07-21 DIAGNOSIS — I5042 Chronic combined systolic (congestive) and diastolic (congestive) heart failure: Secondary | ICD-10-CM | POA: Diagnosis not present

## 2019-07-21 DIAGNOSIS — N183 Chronic kidney disease, stage 3 (moderate): Secondary | ICD-10-CM | POA: Diagnosis not present

## 2019-07-21 DIAGNOSIS — G9341 Metabolic encephalopathy: Secondary | ICD-10-CM | POA: Diagnosis not present

## 2019-07-21 DIAGNOSIS — M6281 Muscle weakness (generalized): Secondary | ICD-10-CM | POA: Diagnosis not present

## 2019-07-21 DIAGNOSIS — R2689 Other abnormalities of gait and mobility: Secondary | ICD-10-CM | POA: Diagnosis not present

## 2019-07-21 DIAGNOSIS — I130 Hypertensive heart and chronic kidney disease with heart failure and stage 1 through stage 4 chronic kidney disease, or unspecified chronic kidney disease: Secondary | ICD-10-CM | POA: Diagnosis not present

## 2019-07-21 DIAGNOSIS — M1990 Unspecified osteoarthritis, unspecified site: Secondary | ICD-10-CM | POA: Diagnosis not present

## 2019-07-21 DIAGNOSIS — M0589 Other rheumatoid arthritis with rheumatoid factor of multiple sites: Secondary | ICD-10-CM | POA: Diagnosis not present

## 2019-07-21 DIAGNOSIS — M25519 Pain in unspecified shoulder: Secondary | ICD-10-CM | POA: Diagnosis not present

## 2019-07-21 NOTE — Patient Outreach (Signed)
[name]) Care Management  07/21/2019  [name] [name] 1930/09/21 [id number]   CSW made a second attempt to try and contact patient's daughter, [name] [name] today to follow-up regarding social work services and resources for patient, as well as assess need for continued social work involvement; however, [name] [name] was not available at the time of CSW's call.  CSW left a HIPAA compliant message on voicemail for [name] [name] and continues to await a return call.  CSW will make a third and final outreach attempt within the next 3-4 business days, if a return call is not received from [name] [name] in the meantime.  CSW will also mail an outreach letter to patient's home, encouraging [name] [name] to contact CSW directly if she is interested in continuing to receive social work services through [place] with Scientist, clinical (histocompatibility and immunogenetics).  CSW will then proceed with case closure if a return call is not received from [name] [name] within a total of 10 business days, as required number of phone attempts will have been made and outreach letter mailed.   [name] [name], BSW, MSW, LCSW  Licensed Education officer, environmental Health System  Mailing [place] [address], [place], [place] [address] Physical Address[address], [place], [place] [address] Toll Free Main # [phone] Fax # [phone] Cell # [phone]  Office # [phone] [name]

## 2019-07-24 DIAGNOSIS — R2689 Other abnormalities of gait and mobility: Secondary | ICD-10-CM | POA: Diagnosis not present

## 2019-07-24 DIAGNOSIS — G9341 Metabolic encephalopathy: Secondary | ICD-10-CM | POA: Diagnosis not present

## 2019-07-24 DIAGNOSIS — U071 COVID-19: Secondary | ICD-10-CM | POA: Diagnosis not present

## 2019-07-24 DIAGNOSIS — M0589 Other rheumatoid arthritis with rheumatoid factor of multiple sites: Secondary | ICD-10-CM | POA: Diagnosis not present

## 2019-07-24 DIAGNOSIS — M6281 Muscle weakness (generalized): Secondary | ICD-10-CM | POA: Diagnosis not present

## 2019-07-24 DIAGNOSIS — M25519 Pain in unspecified shoulder: Secondary | ICD-10-CM | POA: Diagnosis not present

## 2019-07-24 DIAGNOSIS — M1990 Unspecified osteoarthritis, unspecified site: Secondary | ICD-10-CM | POA: Diagnosis not present

## 2019-07-25 DIAGNOSIS — R2689 Other abnormalities of gait and mobility: Secondary | ICD-10-CM | POA: Diagnosis not present

## 2019-07-25 DIAGNOSIS — M0589 Other rheumatoid arthritis with rheumatoid factor of multiple sites: Secondary | ICD-10-CM | POA: Diagnosis not present

## 2019-07-25 DIAGNOSIS — G9341 Metabolic encephalopathy: Secondary | ICD-10-CM | POA: Diagnosis not present

## 2019-07-25 DIAGNOSIS — M25519 Pain in unspecified shoulder: Secondary | ICD-10-CM | POA: Diagnosis not present

## 2019-07-25 DIAGNOSIS — M1990 Unspecified osteoarthritis, unspecified site: Secondary | ICD-10-CM | POA: Diagnosis not present

## 2019-07-25 DIAGNOSIS — M6281 Muscle weakness (generalized): Secondary | ICD-10-CM | POA: Diagnosis not present

## 2019-07-26 ENCOUNTER — Other Ambulatory Visit: Payer: Self-pay | Admitting: *Deleted

## 2019-07-26 DIAGNOSIS — M1990 Unspecified osteoarthritis, unspecified site: Secondary | ICD-10-CM | POA: Diagnosis not present

## 2019-07-26 DIAGNOSIS — M25519 Pain in unspecified shoulder: Secondary | ICD-10-CM | POA: Diagnosis not present

## 2019-07-26 DIAGNOSIS — R2689 Other abnormalities of gait and mobility: Secondary | ICD-10-CM | POA: Diagnosis not present

## 2019-07-26 DIAGNOSIS — M6281 Muscle weakness (generalized): Secondary | ICD-10-CM | POA: Diagnosis not present

## 2019-07-26 DIAGNOSIS — G9341 Metabolic encephalopathy: Secondary | ICD-10-CM | POA: Diagnosis not present

## 2019-07-26 DIAGNOSIS — M0589 Other rheumatoid arthritis with rheumatoid factor of multiple sites: Secondary | ICD-10-CM | POA: Diagnosis not present

## 2019-07-26 NOTE — Patient Outreach (Signed)
PAC Coordination. Call made and successful to daughter, [name] [name].   Advised Mrs. [name] [name] of my role and association with [employer], LCSW.  She informs me that her mother is still in rehab at [facility] and her planned discharge is at the beginning of October. The plan is for her mother to return to her retirement home community. They are in the process of putting in all the equipment and caregivers that will be needed for 24 hour supervision. They have applied for the CAP program and are counting on that service for a large part of the caregiving needs.  I will follow up with them the second week of October to see if there are any care management needs at that time. I have given [name] my contact information if she desires to call me before that time.  Will advise [name] of my contact with pt daughter.  [name]. [name], MSN, [facility] Gerontological Nurse Practitioner [facility] Care Management [phone]

## 2019-07-27 ENCOUNTER — Other Ambulatory Visit: Payer: Self-pay | Admitting: *Deleted

## 2019-07-27 DIAGNOSIS — R2689 Other abnormalities of gait and mobility: Secondary | ICD-10-CM | POA: Diagnosis not present

## 2019-07-27 DIAGNOSIS — G9341 Metabolic encephalopathy: Secondary | ICD-10-CM | POA: Diagnosis not present

## 2019-07-27 DIAGNOSIS — M1990 Unspecified osteoarthritis, unspecified site: Secondary | ICD-10-CM | POA: Diagnosis not present

## 2019-07-27 DIAGNOSIS — M25519 Pain in unspecified shoulder: Secondary | ICD-10-CM | POA: Diagnosis not present

## 2019-07-27 DIAGNOSIS — M6281 Muscle weakness (generalized): Secondary | ICD-10-CM | POA: Diagnosis not present

## 2019-07-27 DIAGNOSIS — M0589 Other rheumatoid arthritis with rheumatoid factor of multiple sites: Secondary | ICD-10-CM | POA: Diagnosis not present

## 2019-07-27 NOTE — Patient Outreach (Signed)
[facility]) Care Management  07/27/2019  KATTIA SELLEY 1930-11-06 [id number]   CSW received a voicemail message from patient's daughter, Excell Seltzer, last evening after hours, in response to the HIPAA compliant message left on voicemail for Mrs. Hampton by CSW on Friday, July 21, 2019.  Mrs. [name] mentioned in her voicemail to [place] that patient continues to reside at [facility] [place], to receive rehabilitative services, but that the plan is for patient to discharge back to her retirement community on Monday, August 14, 2019.  Mrs. [name] indicated that she is in the process of arranging 24 hour care and supervision for patient in the home, already having applied for CAP Librarian, academic), through the [place].  Mrs. [name] went on to explain to [place] that she is waiting to see how many hours of CAP services patient will qualify for per week, before hiring additional in-home care services.  However, Mrs. [name] reported that she already has a few in-home care agencies lined up and on standby, prepared to begin providing in-home care services to patient, spur-of-the-moment.  Last, Mrs. [name] indicated that she is in the process of having all necessary durable medical equipment delivered to patient's home and installed properly.  Mrs. [name] reported that patient will require assistance with activities of daily living, in addition to meal preparation, laundry service, grocery shopping, light housekeeping duties, etc.  CSW made an attempt to try and contact Mrs. [name] today to follow-up regarding social work services and resources, as well as to offer assistance with arranging in-home care services for patient; however, Mrs. [name] was not available at the time of CSW's call.  CSW left a HIPAA compliant message on voicemail for Mrs. [name], explaining that [place] will outreach to her again next week,  on Thursday, August 03, 2019, around 10:00AM.  CSW was able to confirm all of the above information with Deloria Lair, Geriatric Nurse Practitioner, also with [place] [facility], who was able to converse with Mrs. [name] yesterday (Wednesday, July 26, 2019).  CSW also reviewed Ms. Spinks documentation.  [name], BSW, MSW, LCSW  Licensed Education officer, environmental Health System  Mailing [place] [address], [place], [place] [address] Physical Address-[address], [place], [place] [address] Toll Free Main # [phone] Fax # [phone] Cell # [phone]  Office # [phone] [name]

## 2019-07-28 DIAGNOSIS — M0589 Other rheumatoid arthritis with rheumatoid factor of multiple sites: Secondary | ICD-10-CM | POA: Diagnosis not present

## 2019-07-28 DIAGNOSIS — G9341 Metabolic encephalopathy: Secondary | ICD-10-CM | POA: Diagnosis not present

## 2019-07-28 DIAGNOSIS — M1990 Unspecified osteoarthritis, unspecified site: Secondary | ICD-10-CM | POA: Diagnosis not present

## 2019-07-28 DIAGNOSIS — R2689 Other abnormalities of gait and mobility: Secondary | ICD-10-CM | POA: Diagnosis not present

## 2019-07-28 DIAGNOSIS — M6281 Muscle weakness (generalized): Secondary | ICD-10-CM | POA: Diagnosis not present

## 2019-07-28 DIAGNOSIS — M25519 Pain in unspecified shoulder: Secondary | ICD-10-CM | POA: Diagnosis not present

## 2019-07-31 DIAGNOSIS — M6281 Muscle weakness (generalized): Secondary | ICD-10-CM | POA: Diagnosis not present

## 2019-07-31 DIAGNOSIS — R2689 Other abnormalities of gait and mobility: Secondary | ICD-10-CM | POA: Diagnosis not present

## 2019-07-31 DIAGNOSIS — M1990 Unspecified osteoarthritis, unspecified site: Secondary | ICD-10-CM | POA: Diagnosis not present

## 2019-07-31 DIAGNOSIS — U071 COVID-19: Secondary | ICD-10-CM | POA: Diagnosis not present

## 2019-07-31 DIAGNOSIS — G9341 Metabolic encephalopathy: Secondary | ICD-10-CM | POA: Diagnosis not present

## 2019-07-31 DIAGNOSIS — M0589 Other rheumatoid arthritis with rheumatoid factor of multiple sites: Secondary | ICD-10-CM | POA: Diagnosis not present

## 2019-07-31 DIAGNOSIS — M25519 Pain in unspecified shoulder: Secondary | ICD-10-CM | POA: Diagnosis not present

## 2019-08-01 DIAGNOSIS — M6281 Muscle weakness (generalized): Secondary | ICD-10-CM | POA: Diagnosis not present

## 2019-08-01 DIAGNOSIS — G9341 Metabolic encephalopathy: Secondary | ICD-10-CM | POA: Diagnosis not present

## 2019-08-01 DIAGNOSIS — M1990 Unspecified osteoarthritis, unspecified site: Secondary | ICD-10-CM | POA: Diagnosis not present

## 2019-08-01 DIAGNOSIS — M25519 Pain in unspecified shoulder: Secondary | ICD-10-CM | POA: Diagnosis not present

## 2019-08-01 DIAGNOSIS — M0589 Other rheumatoid arthritis with rheumatoid factor of multiple sites: Secondary | ICD-10-CM | POA: Diagnosis not present

## 2019-08-01 DIAGNOSIS — R2689 Other abnormalities of gait and mobility: Secondary | ICD-10-CM | POA: Diagnosis not present

## 2019-08-02 DIAGNOSIS — R2689 Other abnormalities of gait and mobility: Secondary | ICD-10-CM | POA: Diagnosis not present

## 2019-08-02 DIAGNOSIS — M25519 Pain in unspecified shoulder: Secondary | ICD-10-CM | POA: Diagnosis not present

## 2019-08-02 DIAGNOSIS — M1990 Unspecified osteoarthritis, unspecified site: Secondary | ICD-10-CM | POA: Diagnosis not present

## 2019-08-02 DIAGNOSIS — G9341 Metabolic encephalopathy: Secondary | ICD-10-CM | POA: Diagnosis not present

## 2019-08-02 DIAGNOSIS — M0589 Other rheumatoid arthritis with rheumatoid factor of multiple sites: Secondary | ICD-10-CM | POA: Diagnosis not present

## 2019-08-02 DIAGNOSIS — M6281 Muscle weakness (generalized): Secondary | ICD-10-CM | POA: Diagnosis not present

## 2019-08-03 ENCOUNTER — Other Ambulatory Visit: Payer: Self-pay | Admitting: *Deleted

## 2019-08-03 DIAGNOSIS — G8929 Other chronic pain: Secondary | ICD-10-CM | POA: Diagnosis not present

## 2019-08-03 DIAGNOSIS — G9341 Metabolic encephalopathy: Secondary | ICD-10-CM | POA: Diagnosis not present

## 2019-08-03 DIAGNOSIS — M6281 Muscle weakness (generalized): Secondary | ICD-10-CM | POA: Diagnosis not present

## 2019-08-03 DIAGNOSIS — M0589 Other rheumatoid arthritis with rheumatoid factor of multiple sites: Secondary | ICD-10-CM | POA: Diagnosis not present

## 2019-08-03 DIAGNOSIS — M1990 Unspecified osteoarthritis, unspecified site: Secondary | ICD-10-CM | POA: Diagnosis not present

## 2019-08-03 DIAGNOSIS — R2689 Other abnormalities of gait and mobility: Secondary | ICD-10-CM | POA: Diagnosis not present

## 2019-08-03 DIAGNOSIS — M069 Rheumatoid arthritis, unspecified: Secondary | ICD-10-CM | POA: Diagnosis not present

## 2019-08-03 DIAGNOSIS — M25511 Pain in right shoulder: Secondary | ICD-10-CM | POA: Diagnosis not present

## 2019-08-03 DIAGNOSIS — M25519 Pain in unspecified shoulder: Secondary | ICD-10-CM | POA: Diagnosis not present

## 2019-08-03 NOTE — Patient Outreach (Signed)
[facility]) Care Management  08/03/2019  KYLIANA STANDEN Feb 25, 1930 [id number]   CSW made an attempt to try and contact patient's daughter, Excell Seltzer today to follow-up regarding social work services and resources for patient; however, Mrs. [name] was not available at the time of CSW's call.  CSW left a HIPAA compliant message on voicemail for Mrs. [name] and is currently awaiting a return call.  CSW will make a second outreach attempt to Mrs. Hampton within the next 3-4 business days, if a return call is not received from Mrs. Hampton in the meantime.  [name], BSW, MSW, LCSW  Licensed Education officer, environmental Health System  Mailing [place] [address] [place], [place] [address] Physical Address-[address], [place], [place] [address] Toll Free Main # [phone] Fax # [phone] Cell # [phone]  Office # [phone] [name]

## 2019-08-04 DIAGNOSIS — M0589 Other rheumatoid arthritis with rheumatoid factor of multiple sites: Secondary | ICD-10-CM | POA: Diagnosis not present

## 2019-08-04 DIAGNOSIS — R2689 Other abnormalities of gait and mobility: Secondary | ICD-10-CM | POA: Diagnosis not present

## 2019-08-04 DIAGNOSIS — M25519 Pain in unspecified shoulder: Secondary | ICD-10-CM | POA: Diagnosis not present

## 2019-08-04 DIAGNOSIS — M6281 Muscle weakness (generalized): Secondary | ICD-10-CM | POA: Diagnosis not present

## 2019-08-04 DIAGNOSIS — G9341 Metabolic encephalopathy: Secondary | ICD-10-CM | POA: Diagnosis not present

## 2019-08-04 DIAGNOSIS — M1990 Unspecified osteoarthritis, unspecified site: Secondary | ICD-10-CM | POA: Diagnosis not present

## 2019-08-07 DIAGNOSIS — G9341 Metabolic encephalopathy: Secondary | ICD-10-CM | POA: Diagnosis not present

## 2019-08-07 DIAGNOSIS — M0589 Other rheumatoid arthritis with rheumatoid factor of multiple sites: Secondary | ICD-10-CM | POA: Diagnosis not present

## 2019-08-07 DIAGNOSIS — M25519 Pain in unspecified shoulder: Secondary | ICD-10-CM | POA: Diagnosis not present

## 2019-08-07 DIAGNOSIS — M6281 Muscle weakness (generalized): Secondary | ICD-10-CM | POA: Diagnosis not present

## 2019-08-07 DIAGNOSIS — M1990 Unspecified osteoarthritis, unspecified site: Secondary | ICD-10-CM | POA: Diagnosis not present

## 2019-08-07 DIAGNOSIS — R2689 Other abnormalities of gait and mobility: Secondary | ICD-10-CM | POA: Diagnosis not present

## 2019-08-08 DIAGNOSIS — R2689 Other abnormalities of gait and mobility: Secondary | ICD-10-CM | POA: Diagnosis not present

## 2019-08-08 DIAGNOSIS — M25519 Pain in unspecified shoulder: Secondary | ICD-10-CM | POA: Diagnosis not present

## 2019-08-08 DIAGNOSIS — M1990 Unspecified osteoarthritis, unspecified site: Secondary | ICD-10-CM | POA: Diagnosis not present

## 2019-08-08 DIAGNOSIS — M0589 Other rheumatoid arthritis with rheumatoid factor of multiple sites: Secondary | ICD-10-CM | POA: Diagnosis not present

## 2019-08-08 DIAGNOSIS — M6281 Muscle weakness (generalized): Secondary | ICD-10-CM | POA: Diagnosis not present

## 2019-08-08 DIAGNOSIS — G9341 Metabolic encephalopathy: Secondary | ICD-10-CM | POA: Diagnosis not present

## 2019-08-09 ENCOUNTER — Other Ambulatory Visit: Payer: Self-pay | Admitting: *Deleted

## 2019-08-09 ENCOUNTER — Other Ambulatory Visit: Payer: Self-pay | Admitting: Pharmacist

## 2019-08-09 ENCOUNTER — Other Ambulatory Visit: Payer: Self-pay

## 2019-08-09 DIAGNOSIS — M25519 Pain in unspecified shoulder: Secondary | ICD-10-CM | POA: Diagnosis not present

## 2019-08-09 DIAGNOSIS — M1990 Unspecified osteoarthritis, unspecified site: Secondary | ICD-10-CM | POA: Diagnosis not present

## 2019-08-09 DIAGNOSIS — G9341 Metabolic encephalopathy: Secondary | ICD-10-CM | POA: Diagnosis not present

## 2019-08-09 DIAGNOSIS — M6281 Muscle weakness (generalized): Secondary | ICD-10-CM | POA: Diagnosis not present

## 2019-08-09 DIAGNOSIS — R2689 Other abnormalities of gait and mobility: Secondary | ICD-10-CM | POA: Diagnosis not present

## 2019-08-09 DIAGNOSIS — M0589 Other rheumatoid arthritis with rheumatoid factor of multiple sites: Secondary | ICD-10-CM | POA: Diagnosis not present

## 2019-08-09 NOTE — Addendum Note (Signed)
Addended by: [name] D on: 08/09/2019 12:44 PM   Modules accepted: Orders

## 2019-08-09 NOTE — Patient Outreach (Signed)
[facility]) Care Management  08/09/2019  [name] [name] [birth year] [id number]  CSW was able to make contact with patient's daughter, [name] [name] today to follow-up regarding social work services and resources for patient. Mrs. [name] [name] confirmed that patient continues to reside at [facility] [place], to receive short-term rehabilitative services.  Mrs. [name] [name] reported that she is hoping that patient will be able to discharge from [facility] on Monday, August 14, 2019, if all the durable medical equipment can be delivered and set up in patient's home by then.  If not, patient will be discharged from [facility] on Saturday, August 19, 2019, at the very latest.      Mrs. [name] [name] explained that all of patient's durable medical equipment has been ordered, courtesy of the Veterinary surgeon at [facility], she is just waiting for the home health agency to provide her with a delivery date.  Mrs. [name] [name] further explained that she is also waiting for a return call from patient's Medicaid case worker, through the [place], to inform her of the exact date that patient's Adult Medicaid coverage was reinstated.  [name] Medicaid case worker is also assisting [name] [name] with getting patient's CAP Haematologist Program) coverage reinstated.    Once CAP coverage is reinstated, Mrs. [name] [name] will then be able to decide how many hours of additional in-home care services she will need to arrange for patient per week, as patient will definitely require 24 hour care and supervision.  Patient is still unable to stand or walk, and is completely confined to her wheelchair.  Patient is anxious to return home to live independently, but is totally agreeable to receiving around the clock caregiver services.  [name] [name] admitted that she will probably require transportation  assistance for patient, to and from physician appointments, requesting a list of transportation resources that would be able to accommodate patient.  CSW agreed to place a referral to [employer], Social Work Social worker, also with Scientist, clinical (histocompatibility and immunogenetics), to assist [name] [name] with arranging transportation services for patient.  CSW spoke with Mrs. [name] [name] about various transportation resources (i.e. [employer], [employer], etc.); however, CSW explained to Mrs. [name] [name] that [place] does not think that patient would be eligible for these services if patient is completely wheelchair bound.  CSW went on to explain to Mrs. [name] [name] that patient may require transportation assistance through [employer], which comes with an out-of-pocket expense.  Mrs. [name] [name] voiced understanding.   In addition, Mrs. [name] [name] believes that patient would benefit from pharmacy services, requesting that [place] place an order for patient to receive a pharmacist or certified pharmacy technician, also through [facility].  CSW assured Mrs. [name] [name] that [facility] would make that referral today, in the event that she has questions about patient's medications, prior to patient returning home.  CSW agreed to follow-up with Mrs. [name] [name] again on Wednesday, August 23, 2019, around 11:00AM, once patient has had a chance to get settled in back at home.  [name] [name], BSW, MSW, LCSW  Licensed Education officer, environmental Health System  Mailing [place] [address], [place], [place] [address] Physical Address-[address], [place], [place] [address] Toll Free Main # [phone] Fax # [phone] Cell # [phone]  Office # [phone] [name]

## 2019-08-09 NOTE — Patient Outreach (Signed)
[facility]) Care Management  [place]  08/09/2019  [name] [name] 1929-12-31 [id number]   Reason for referral: Medication Management  Referral source: [facility] Social Worker Current insurance: [employer]  Outreach:  Unsuccessful telephone call attempt #1 to patient's daughter, [name] [name].   HIPAA compliant voicemail left requesting a return call  Plan:  -I will make another outreach attempt to patient within 3-4 business days.    Ralene Bathe, PharmD, [facility] [phone]

## 2019-08-09 NOTE — Patient Outreach (Signed)
[name]  08/09/2019  [name] [name] November 30, 1929 [id number]   In-basket message received from [place], [name], to outreach patient's daughter regarding transportation resources.  Unsuccessful outreach to daughter today.  Left voicemail message.  Will attempt to reach again within four business days.  Ronn Melena, BSW Social Worker [phone]

## 2019-08-10 ENCOUNTER — Other Ambulatory Visit: Payer: Self-pay

## 2019-08-10 ENCOUNTER — Ambulatory Visit: Payer: Self-pay

## 2019-08-10 DIAGNOSIS — I10 Essential (primary) hypertension: Secondary | ICD-10-CM | POA: Diagnosis not present

## 2019-08-10 DIAGNOSIS — M6281 Muscle weakness (generalized): Secondary | ICD-10-CM | POA: Diagnosis not present

## 2019-08-10 DIAGNOSIS — M259 Joint disorder, unspecified: Secondary | ICD-10-CM | POA: Diagnosis not present

## 2019-08-10 DIAGNOSIS — R2689 Other abnormalities of gait and mobility: Secondary | ICD-10-CM | POA: Diagnosis not present

## 2019-08-10 DIAGNOSIS — M25519 Pain in unspecified shoulder: Secondary | ICD-10-CM | POA: Diagnosis not present

## 2019-08-10 DIAGNOSIS — G9341 Metabolic encephalopathy: Secondary | ICD-10-CM | POA: Diagnosis not present

## 2019-08-10 DIAGNOSIS — M1990 Unspecified osteoarthritis, unspecified site: Secondary | ICD-10-CM | POA: Diagnosis not present

## 2019-08-10 DIAGNOSIS — L97212 Non-pressure chronic ulcer of right calf with fat layer exposed: Secondary | ICD-10-CM | POA: Diagnosis not present

## 2019-08-10 DIAGNOSIS — L88 Pyoderma gangrenosum: Secondary | ICD-10-CM | POA: Diagnosis not present

## 2019-08-10 DIAGNOSIS — M0589 Other rheumatoid arthritis with rheumatoid factor of multiple sites: Secondary | ICD-10-CM | POA: Diagnosis not present

## 2019-08-10 NOTE — Patient Outreach (Signed)
[facility]) Care Management  08/10/2019  TEDI HUGHSON 1930-01-20 [id number]   In-basket message received from [place], on 08/09/19 to outreach patient's daughter regarding transportation resources.  Second unsuccessful outreach to daughter today.  Left voicemail message.  Mailed unsuccessful outreach letter.  Will attempt to reach again within four business days.  Ronn Melena, BSW Social Worker [phone]

## 2019-08-12 DIAGNOSIS — M0589 Other rheumatoid arthritis with rheumatoid factor of multiple sites: Secondary | ICD-10-CM | POA: Diagnosis not present

## 2019-08-12 DIAGNOSIS — M1990 Unspecified osteoarthritis, unspecified site: Secondary | ICD-10-CM | POA: Diagnosis not present

## 2019-08-12 DIAGNOSIS — G9341 Metabolic encephalopathy: Secondary | ICD-10-CM | POA: Diagnosis not present

## 2019-08-12 DIAGNOSIS — M6281 Muscle weakness (generalized): Secondary | ICD-10-CM | POA: Diagnosis not present

## 2019-08-12 DIAGNOSIS — M25519 Pain in unspecified shoulder: Secondary | ICD-10-CM | POA: Diagnosis not present

## 2019-08-12 DIAGNOSIS — R2689 Other abnormalities of gait and mobility: Secondary | ICD-10-CM | POA: Diagnosis not present

## 2019-08-14 DIAGNOSIS — G9341 Metabolic encephalopathy: Secondary | ICD-10-CM | POA: Diagnosis not present

## 2019-08-14 DIAGNOSIS — E1169 Type 2 diabetes mellitus with other specified complication: Secondary | ICD-10-CM | POA: Diagnosis not present

## 2019-08-14 DIAGNOSIS — M069 Rheumatoid arthritis, unspecified: Secondary | ICD-10-CM | POA: Diagnosis not present

## 2019-08-14 DIAGNOSIS — M25519 Pain in unspecified shoulder: Secondary | ICD-10-CM | POA: Diagnosis not present

## 2019-08-14 DIAGNOSIS — R2689 Other abnormalities of gait and mobility: Secondary | ICD-10-CM | POA: Diagnosis not present

## 2019-08-14 DIAGNOSIS — N189 Chronic kidney disease, unspecified: Secondary | ICD-10-CM | POA: Diagnosis not present

## 2019-08-14 DIAGNOSIS — M0589 Other rheumatoid arthritis with rheumatoid factor of multiple sites: Secondary | ICD-10-CM | POA: Diagnosis not present

## 2019-08-14 DIAGNOSIS — M1990 Unspecified osteoarthritis, unspecified site: Secondary | ICD-10-CM | POA: Diagnosis not present

## 2019-08-14 DIAGNOSIS — M109 Gout, unspecified: Secondary | ICD-10-CM | POA: Diagnosis not present

## 2019-08-14 DIAGNOSIS — M6281 Muscle weakness (generalized): Secondary | ICD-10-CM | POA: Diagnosis not present

## 2019-08-15 ENCOUNTER — Ambulatory Visit: Payer: Self-pay | Admitting: Pharmacist

## 2019-08-15 ENCOUNTER — Other Ambulatory Visit: Payer: Self-pay | Admitting: Pharmacist

## 2019-08-15 DIAGNOSIS — M0589 Other rheumatoid arthritis with rheumatoid factor of multiple sites: Secondary | ICD-10-CM | POA: Diagnosis not present

## 2019-08-15 DIAGNOSIS — M1990 Unspecified osteoarthritis, unspecified site: Secondary | ICD-10-CM | POA: Diagnosis not present

## 2019-08-15 DIAGNOSIS — M25519 Pain in unspecified shoulder: Secondary | ICD-10-CM | POA: Diagnosis not present

## 2019-08-15 DIAGNOSIS — R2689 Other abnormalities of gait and mobility: Secondary | ICD-10-CM | POA: Diagnosis not present

## 2019-08-15 DIAGNOSIS — G9341 Metabolic encephalopathy: Secondary | ICD-10-CM | POA: Diagnosis not present

## 2019-08-15 DIAGNOSIS — M6281 Muscle weakness (generalized): Secondary | ICD-10-CM | POA: Diagnosis not present

## 2019-08-15 NOTE — Patient Outreach (Signed)
[facility]) Care Management  [place]   08/15/2019  SIANI UTKE 14-Jun-1930 [id number]  Reason for referral: Medication Management  Referral source: [facility] Social Worker Current insurance: [employer]  Outreach:  Incoming call from patient's daughter, [name]. HIPAA identifiers verified.   Subjective:  Daughter reports patient is still at [facility] place with tentative discharge plans for Oct 12.  She is requesting a home visit to review medications once patient is discharged.  I reviewed with daughter that [facility] does not generally do home visits anymore however I am happy to review medications telephonically.  I encouraged daughter to put all medications from the home in a bag to keep in a separate place from the medications that will be coming home with patient from [facility].   We reviewed how the medications from the SNF may be in bubble packs and it is very important to obtain a medication list from the facility.  I also encouraged daughter to schedule a PCP appt for patient soon after she is home as many times medications are changed after patient discharged from SNF.  Daughter voiced understanding.  She will call to schedule PCP appt and will gather up all medications currently in the home.   Plan: . Will f/u with patient and daughter next week after patient discharged home from [place], PharmD, [place] [phone]

## 2019-08-16 ENCOUNTER — Ambulatory Visit: Payer: Self-pay

## 2019-08-16 ENCOUNTER — Other Ambulatory Visit: Payer: Self-pay

## 2019-08-16 ENCOUNTER — Other Ambulatory Visit: Payer: Self-pay | Admitting: *Deleted

## 2019-08-16 DIAGNOSIS — M0589 Other rheumatoid arthritis with rheumatoid factor of multiple sites: Secondary | ICD-10-CM | POA: Diagnosis not present

## 2019-08-16 DIAGNOSIS — G9341 Metabolic encephalopathy: Secondary | ICD-10-CM | POA: Diagnosis not present

## 2019-08-16 DIAGNOSIS — M25519 Pain in unspecified shoulder: Secondary | ICD-10-CM | POA: Diagnosis not present

## 2019-08-16 DIAGNOSIS — M6281 Muscle weakness (generalized): Secondary | ICD-10-CM | POA: Diagnosis not present

## 2019-08-16 DIAGNOSIS — D649 Anemia, unspecified: Secondary | ICD-10-CM | POA: Diagnosis not present

## 2019-08-16 DIAGNOSIS — M069 Rheumatoid arthritis, unspecified: Secondary | ICD-10-CM | POA: Diagnosis not present

## 2019-08-16 DIAGNOSIS — M1990 Unspecified osteoarthritis, unspecified site: Secondary | ICD-10-CM | POA: Diagnosis not present

## 2019-08-16 DIAGNOSIS — R2689 Other abnormalities of gait and mobility: Secondary | ICD-10-CM | POA: Diagnosis not present

## 2019-08-16 DIAGNOSIS — R112 Nausea with vomiting, unspecified: Secondary | ICD-10-CM | POA: Diagnosis not present

## 2019-08-16 NOTE — Patient Outreach (Signed)
[facility]) Care Management  08/16/2019  [name] [name] 1930/09/02 [id number]   Received voicemail message from patient's daughter on 08/15/19.  Called her back today but had to leave message.  Will attempt to reach again within four business days.  Ronn Melena, BSW Social Worker [phone]

## 2019-08-17 ENCOUNTER — Other Ambulatory Visit: Payer: Self-pay

## 2019-08-17 ENCOUNTER — Ambulatory Visit: Payer: Self-pay

## 2019-08-17 DIAGNOSIS — R112 Nausea with vomiting, unspecified: Secondary | ICD-10-CM | POA: Diagnosis not present

## 2019-08-17 DIAGNOSIS — M1990 Unspecified osteoarthritis, unspecified site: Secondary | ICD-10-CM | POA: Diagnosis not present

## 2019-08-17 DIAGNOSIS — E871 Hypo-osmolality and hyponatremia: Secondary | ICD-10-CM | POA: Diagnosis not present

## 2019-08-17 DIAGNOSIS — M0589 Other rheumatoid arthritis with rheumatoid factor of multiple sites: Secondary | ICD-10-CM | POA: Diagnosis not present

## 2019-08-17 DIAGNOSIS — M25519 Pain in unspecified shoulder: Secondary | ICD-10-CM | POA: Diagnosis not present

## 2019-08-17 DIAGNOSIS — D649 Anemia, unspecified: Secondary | ICD-10-CM | POA: Diagnosis not present

## 2019-08-17 DIAGNOSIS — G9341 Metabolic encephalopathy: Secondary | ICD-10-CM | POA: Diagnosis not present

## 2019-08-17 DIAGNOSIS — M6281 Muscle weakness (generalized): Secondary | ICD-10-CM | POA: Diagnosis not present

## 2019-08-17 DIAGNOSIS — R2689 Other abnormalities of gait and mobility: Secondary | ICD-10-CM | POA: Diagnosis not present

## 2019-08-17 DIAGNOSIS — I5042 Chronic combined systolic (congestive) and diastolic (congestive) heart failure: Secondary | ICD-10-CM | POA: Diagnosis not present

## 2019-08-17 NOTE — Patient Outreach (Addendum)
[facility]) Care Management  08/17/2019  JAILENE CUPIT Jan 28, 1930 [id number]   Received voicemail message from patient's daughter on 08/15/19.  Called her back on 10/7 and 10/8 but had to leave messages both times.  Will make final attempt to reach within four business days.  Unsuccessful outreach letter mailed on 08/10/19.   Addendum: Received return call from patient's daughter.  Informed her that patient may have transportation benefit through [employer].  Encouraged her to contact customer service to verify. Informed patient about Medicaid transportation.  Provided her with contact information as assessment will need to be completed via phone in order for patient to use service.  Informed daughter that a note from medical provider will be needed if she intends to ride with patient via Medicaid transportation. Discussed SCAT services and offered to submit application but daughter does not wish for patient to use this service. Closing social work case but did encourage daughter to call if additional needs arise.   Ronn Melena, BSW Social Worker [phone]

## 2019-08-18 ENCOUNTER — Other Ambulatory Visit: Payer: Self-pay

## 2019-08-18 ENCOUNTER — Other Ambulatory Visit: Payer: Self-pay | Admitting: *Deleted

## 2019-08-18 ENCOUNTER — Ambulatory Visit: Payer: Self-pay | Admitting: *Deleted

## 2019-08-18 DIAGNOSIS — M6281 Muscle weakness (generalized): Secondary | ICD-10-CM | POA: Diagnosis not present

## 2019-08-18 DIAGNOSIS — R2689 Other abnormalities of gait and mobility: Secondary | ICD-10-CM | POA: Diagnosis not present

## 2019-08-18 DIAGNOSIS — G9341 Metabolic encephalopathy: Secondary | ICD-10-CM | POA: Diagnosis not present

## 2019-08-18 DIAGNOSIS — M1990 Unspecified osteoarthritis, unspecified site: Secondary | ICD-10-CM | POA: Diagnosis not present

## 2019-08-18 DIAGNOSIS — M25519 Pain in unspecified shoulder: Secondary | ICD-10-CM | POA: Diagnosis not present

## 2019-08-18 DIAGNOSIS — M0589 Other rheumatoid arthritis with rheumatoid factor of multiple sites: Secondary | ICD-10-CM | POA: Diagnosis not present

## 2019-08-18 NOTE — Patient Outreach (Signed)
Call to see if Ms. Machnik is home from her SNF stay. Left message for Ms. Matzke or her daughter to return my call to establish post SNF care management.  [name]. [name], MSN, [facility] Gerontological Nurse Practitioner [facility] Care Management [phone]

## 2019-08-19 DIAGNOSIS — M1990 Unspecified osteoarthritis, unspecified site: Secondary | ICD-10-CM | POA: Diagnosis not present

## 2019-08-19 DIAGNOSIS — M6281 Muscle weakness (generalized): Secondary | ICD-10-CM | POA: Diagnosis not present

## 2019-08-19 DIAGNOSIS — G9341 Metabolic encephalopathy: Secondary | ICD-10-CM | POA: Diagnosis not present

## 2019-08-19 DIAGNOSIS — R2689 Other abnormalities of gait and mobility: Secondary | ICD-10-CM | POA: Diagnosis not present

## 2019-08-19 DIAGNOSIS — M25519 Pain in unspecified shoulder: Secondary | ICD-10-CM | POA: Diagnosis not present

## 2019-08-19 DIAGNOSIS — M0589 Other rheumatoid arthritis with rheumatoid factor of multiple sites: Secondary | ICD-10-CM | POA: Diagnosis not present

## 2019-08-21 DIAGNOSIS — E871 Hypo-osmolality and hyponatremia: Secondary | ICD-10-CM | POA: Diagnosis not present

## 2019-08-21 DIAGNOSIS — M069 Rheumatoid arthritis, unspecified: Secondary | ICD-10-CM | POA: Diagnosis not present

## 2019-08-21 DIAGNOSIS — D649 Anemia, unspecified: Secondary | ICD-10-CM | POA: Diagnosis not present

## 2019-08-21 DIAGNOSIS — I5042 Chronic combined systolic (congestive) and diastolic (congestive) heart failure: Secondary | ICD-10-CM | POA: Diagnosis not present

## 2019-08-21 DIAGNOSIS — M6281 Muscle weakness (generalized): Secondary | ICD-10-CM | POA: Diagnosis not present

## 2019-08-21 DIAGNOSIS — R2689 Other abnormalities of gait and mobility: Secondary | ICD-10-CM | POA: Diagnosis not present

## 2019-08-21 DIAGNOSIS — M0589 Other rheumatoid arthritis with rheumatoid factor of multiple sites: Secondary | ICD-10-CM | POA: Diagnosis not present

## 2019-08-21 DIAGNOSIS — Z79899 Other long term (current) drug therapy: Secondary | ICD-10-CM | POA: Diagnosis not present

## 2019-08-21 DIAGNOSIS — M25519 Pain in unspecified shoulder: Secondary | ICD-10-CM | POA: Diagnosis not present

## 2019-08-21 DIAGNOSIS — G9341 Metabolic encephalopathy: Secondary | ICD-10-CM | POA: Diagnosis not present

## 2019-08-21 DIAGNOSIS — M1990 Unspecified osteoarthritis, unspecified site: Secondary | ICD-10-CM | POA: Diagnosis not present

## 2019-08-22 ENCOUNTER — Other Ambulatory Visit: Payer: Self-pay

## 2019-08-22 ENCOUNTER — Ambulatory Visit: Payer: Self-pay

## 2019-08-22 DIAGNOSIS — E08311 Diabetes mellitus due to underlying condition with unspecified diabetic retinopathy with macular edema: Secondary | ICD-10-CM | POA: Diagnosis not present

## 2019-08-22 DIAGNOSIS — I10 Essential (primary) hypertension: Secondary | ICD-10-CM | POA: Diagnosis not present

## 2019-08-22 DIAGNOSIS — D638 Anemia in other chronic diseases classified elsewhere: Secondary | ICD-10-CM | POA: Diagnosis not present

## 2019-08-22 DIAGNOSIS — Z79899 Other long term (current) drug therapy: Secondary | ICD-10-CM | POA: Diagnosis not present

## 2019-08-23 ENCOUNTER — Ambulatory Visit (INDEPENDENT_AMBULATORY_CARE_PROVIDER_SITE_OTHER): Payer: Medicare Other | Admitting: Family Medicine

## 2019-08-23 ENCOUNTER — Encounter: Payer: Self-pay | Admitting: Family Medicine

## 2019-08-23 ENCOUNTER — Other Ambulatory Visit: Payer: Self-pay | Admitting: *Deleted

## 2019-08-23 VITALS — BP 130/80 | Ht 64.0 in

## 2019-08-23 DIAGNOSIS — I5042 Chronic combined systolic (congestive) and diastolic (congestive) heart failure: Secondary | ICD-10-CM | POA: Diagnosis not present

## 2019-08-23 DIAGNOSIS — M0579 Rheumatoid arthritis with rheumatoid factor of multiple sites without organ or systems involvement: Secondary | ICD-10-CM

## 2019-08-23 DIAGNOSIS — Z794 Long term (current) use of insulin: Secondary | ICD-10-CM | POA: Diagnosis not present

## 2019-08-23 DIAGNOSIS — I10 Essential (primary) hypertension: Secondary | ICD-10-CM | POA: Diagnosis not present

## 2019-08-23 DIAGNOSIS — R5381 Other malaise: Secondary | ICD-10-CM

## 2019-08-23 DIAGNOSIS — E119 Type 2 diabetes mellitus without complications: Secondary | ICD-10-CM | POA: Diagnosis not present

## 2019-08-23 DIAGNOSIS — E871 Hypo-osmolality and hyponatremia: Secondary | ICD-10-CM

## 2019-08-23 DIAGNOSIS — M48061 Spinal stenosis, lumbar region without neurogenic claudication: Secondary | ICD-10-CM

## 2019-08-23 DIAGNOSIS — D638 Anemia in other chronic diseases classified elsewhere: Secondary | ICD-10-CM

## 2019-08-23 DIAGNOSIS — M069 Rheumatoid arthritis, unspecified: Secondary | ICD-10-CM | POA: Diagnosis not present

## 2019-08-23 NOTE — Patient Outreach (Signed)
[facility]) Care Management  08/23/2019  KARIANN WECKER November 25, 1929 [id number]   CSW made an attempt to try and contact patient's daughter, Excell Seltzer today, to see, if in fact, patient was able to discharge from [facility] [place] where patient was residing to receive rehabilitative services on Monday, August 21, 2019, as planned; however, Mrs. [name] was not available at the time of CSW's call.  CSW left a HIPAA compliant message for Mrs. Hampton on voicemail and is currently awaiting a return call.  CSW will make a second outreach attempt within the next 3-4 business days, if a return call is not received from Mrs. Hampton in the meantime.  [name], BSW, MSW, LCSW  Licensed Education officer, environmental Health System  Mailing [place] [address] [place], [place] [address] Physical Address-[address], [place], [place] [address] Toll Free Main # [phone] Fax # [phone] Cell # [phone]  Office # [phone] [name]

## 2019-08-23 NOTE — Progress Notes (Addendum)
[name] [name] is a 85 y.o. female  Chief Complaint  Patient presents with   Establish Care    HPI: [name] [name] is a 85 y.o. female here with her [name] to transfer care to our office. Pt has 2 grown daughters. Previous PCP Dr. Earlene PlaterWallace.  She was discharged home today from rehab center - [facility]. Daughter states it was not a good place for pt especially since she could not have visitors d/t covid.  She has home health set up. Daughter lives 5 min away.  Pt needs f/u on sodium level (hyponatremia) and hgb/hct she has a h/o anemia (last Hgb 8.1 yesterday 08/22/19). Pt was requested to have both rechecked in about 1 week. If Hgb remains above 8.0, pt can restart her rheumatologic medication.  Specialists: cardio (Dr. Chilton Siiffany Van), rheum ([facility]. [facility]), ortho/spine (Dr. Darrelyn [facility]ioffre [facility])  Med refills needed today: none  Daughter requests DME Rx for wheelchair as well as hoyer life. Patient has mobility limitations which cannot be resolved with a cane, crutch or walker. Patient can safely self propel the standard wheelchair or has a caregiver who can assist at all times. Patient requires a hoyer lift due to her spinal stenosis, generalized muscular deconditioning, and generalized OA.   Past Medical History:  Diagnosis Date   Allergic rhinitis due to pollen 04/27/2007   Arthritis    "in q joint" (08/07/2013)   CHF (congestive heart failure) (HCC)    Coronary atherosclerosis of native coronary artery    Cough    Edema 05/03/2013   Exertional shortness of breath    "sometimes" (08/07/2013)   First degree atrioventricular block    Gout, unspecified 04/19/2013   Hyperlipidemia 04/27/2007   Hypertension    Midline low back pain without sciatica 06/27/2014   Muscle spasm    Muscle weakness (generalized)    Onychia and paronychia of toe    Osteoarthrosis, unspecified whether generalized or localized,  unspecified site 04/27/2007   Other fall    Other malaise and fatigue    Pneumonia 05/2018   Prepatellar bursitis    Pulmonary embolism (HCC) 07/2018   Rheumatic nodule (HCC) 06/28/2017   Seizures (HCC)    Stroke (HCC) 01/17/2014   Type II diabetes mellitus (HCC)    "fasting 90-110s" (08/07/2013)   Unspecified vitamin D deficiency     Past Surgical History:  Procedure Laterality Date   ABDOMINAL HYSTERECTOMY  04/1980   APPENDECTOMY  1960   CARDIAC CATHETERIZATION  2003   CATARACT EXTRACTION W/ INTRAOCULAR LENS  IMPLANT, BILATERAL Bilateral ~ 2012   CHOLECYSTECTOMY N/A 06/26/2018   Procedure: LAPAROSCOPIC CHOLECYSTECTOMY POSSIBLE INTRAOPERATIVE CHOLANGIOGRAM;  Surgeon: Abigail MiyamotoBlackman, Douglas, MD;  Location: [facility] OR;  Service: General;  Laterality: N/A;   JOINT REPLACEMENT     REPLACEMENT TOTAL KNEE Right 09/2005   RIGHT/LEFT HEART CATH AND CORONARY ANGIOGRAPHY N/A 06/01/2018   Procedure: RIGHT/LEFT HEART CATH AND CORONARY ANGIOGRAPHY;  Surgeon: Corky CraftsVaranasi, Jayadeep S, MD;  [facility];  Service: Cardiovascular;  Laterality: N/A;   SHOULDER OPEN ROTATOR CUFF REPAIR Right 07/1999   TONSILLECTOMY  08/1974   TOTAL KNEE ARTHROPLASTY Left 08/07/2013   Procedure: TOTAL KNEE ARTHROPLASTY- LEFT;  Surgeon: Dannielle HuhSteve Lucey, MD;  Location: [facility] OR;  Service: Orthopedics;  Laterality: Left;   TRANSTHORACIC ECHOCARDIOGRAM  2003   EF 55-65%; mild concentric LVH    Social History   Socioeconomic History   Marital status: Widowed    Spouse name:  Not on file   Number of children: 3   Years of education: 12   Highest education level: High school graduate  Occupational History   Not on file  Social Needs   Financial resource strain: Not hard at all   Food insecurity    Worry: Patient refused    Inability: Patient refused   Transportation needs    Medical: Patient refused    Non-medical: Patient refused  Tobacco Use   Smoking status: Never Smoker   Smokeless  tobacco: Never Used  Substance and Sexual Activity   Alcohol use: No    Alcohol/week: 0.0 standard drinks   Drug use: No   Sexual activity: Not Currently  Lifestyle   Physical activity    Days per week: 0 days    Minutes per session: 0 min   Stress: Not at all  Relationships   Social connections    Talks on phone: More than three times a week    Gets together: Never    Attends religious service: Never    Active member of club or organization: No    Attends meetings of clubs or organizations: Never    Relationship status: Widowed   Intimate partner violence    Fear of current or ex partner: Patient refused    Emotionally abused: Patient refused    Physically abused: Patient refused    Forced sexual activity: Patient refused  Other Topics Concern   Not on file  Social History Narrative   Widowed   Patient is currently unable to ambulate at all.    Family History  Problem Relation Age of Onset   Heart attack Father      Immunization History  Administered Date(s) Administered   Influenza Whole 09/06/2012   Influenza, High Dose Seasonal PF 09/13/2017   Influenza,inj,Quad PF,6+ Mos 09/12/2013, 09/11/2014, 07/23/2015, 09/16/2016, 09/07/2018   Pneumococcal Polysaccharide-23 04/13/2005, 11/09/2009, 09/12/2015   Td 04/13/2005   Zoster 11/10/2011    Outpatient Encounter Medications as of 08/23/2019  Medication Sig Note   acetaminophen (TYLENOL) 500 MG tablet Take 1,000 mg by mouth every 12 (twelve) hours.     albuterol (PROVENTIL HFA;VENTOLIN HFA) 108 (90 Base) MCG/ACT inhaler Inhale 1-2 puffs into the lungs every 6 (six) hours as needed for wheezing or shortness of breath.    allopurinol (ZYLOPRIM) 100 MG tablet Take 1 tablet (100 mg total) by mouth at bedtime.    carvedilol (COREG) 25 MG tablet Take 1 tablet (25 mg total) by mouth 2 (two) times daily with a meal.    D3-1000 25 MCG (1000 UT) capsule Take 1 capsule (1,000 Units total) by mouth daily.  (Patient taking differently: Take 1,000 Units by mouth at bedtime. )    docusate sodium (COLACE) 100 MG capsule Take 1 capsule (100 mg total) by mouth daily as needed for mild constipation.    ENSURE (ENSURE) Take 237 mLs by mouth every evening.    ferrous sulfate 325 (65 FE) MG tablet Take 325 mg by mouth every other day.    furosemide (LASIX) 40 MG tablet Take 40 mg by mouth 2 (two) times daily.    gabapentin (NEURONTIN) 100 MG capsule Take 1 capsule (100 mg total) by mouth 3 (three) times daily.    hydrALAZINE (APRESOLINE) 50 MG tablet Take 1 tablet (50 mg total) by mouth 3 (three) times daily.    hydroxychloroquine (PLAQUENIL) 200 MG tablet Take 1 tablet (200 mg total) by mouth 2 (two) times daily.    hydroxypropyl methylcellulose /  hypromellose (ISOPTO TEARS / GONIOVISC) 2.5 % ophthalmic solution Place 1 drop into both eyes daily.    isosorbide mononitrate (IMDUR) 30 MG 24 hr tablet Take 1 tablet (30 mg total) by mouth at bedtime.    leflunomide (ARAVA) 20 MG tablet Take 1 tablet (20 mg total) by mouth daily.    Lidocaine (ASPERCREME LIDOCAINE) 4 % PTCH Apply 1 Package topically daily.     LORazepam (ATIVAN) 0.5 MG tablet Take 0.25 mg by mouth as needed for anxiety.     Melatonin 3 MG TABS Take 6 mg by mouth at bedtime.     Olopatadine HCl 0.2 % SOLN Place 1 drop into both eyes daily.    potassium chloride SA (K-DUR) 20 MEQ tablet Take 20 mEq by mouth See admin instructions. Monday and Thursday    predniSONE (DELTASONE) 10 MG tablet Take 10 mg by mouth daily with breakfast. 04/07/2019: CONT   rivaroxaban (XARELTO) 20 MG TABS tablet Take 1 tablet (20 mg total) by mouth daily with supper.    saccharomyces boulardii (FLORASTOR) 250 MG capsule Take 250 mg by mouth every other day.    sacubitril-valsartan (ENTRESTO) 49-51 MG Take 1 tablet by mouth daily at 12 noon.    sitaGLIPtin (JANUVIA) 25 MG tablet Take 12.5 mg by mouth daily.     traMADol (ULTRAM) 50 MG tablet Take 100  mg by mouth 3 (three) times daily as needed.    [DISCONTINUED] furosemide (LASIX) 40 MG tablet Take 1 tablet (40 mg total) by mouth daily. (Patient taking differently: Take 40 mg by mouth 2 (two) times daily. )    No facility-administered encounter medications on file as of 08/23/2019.      ROS: Gen: no fever, chills  Skin: no rash, itching ENT: no ear pain, ear drainage, nasal congestion, rhinorrhea, sinus pressure, sore throat Resp: no cough, wheeze,SOB CV: no CP, palpitations; + LE edema - improving GI: no heartburn, n/v/d/c, abd pain GU: no dysuria, urgency, frequency, hematuria  MSK: + back pain - h/o spinal stenosis, pt is in wheelchair, cannot walk Neuro: no dizziness, headache    Allergies  Allergen Reactions   Clonidine Derivatives Swelling    [name]   Fish Allergy Diarrhea, Swelling and Other (See Comments)    Turns skin "black," but can tolerate white fish Salmon- Diarrhea   Shellfish Allergy Hives   Doxycycline Rash   Indomethacin Other (See Comments)    Reaction not recalled by the patient   Lyrica [Pregabalin] Other (See Comments)    Hallucinations    Methyldopa Other (See Comments)    Aldomet (for hypertension): Reaction not recalled by the patient   Morphine And Related Other (See Comments)    Family reports it drops her bp that she needs iv fluids   Orange Fruit [Citrus] Other (See Comments)    Indigestion/heartburn   Strawberry (Diagnostic) Itching   Cetirizine Hcl Itching and Rash   Codeine Itching   Levaquin [Levofloxacin In D5w] Rash   Tomato Rash    BP 130/80    Ht 5\' 4"  (1.626 m)    BMI 31.58 kg/m   Physical Exam  Constitutional: She is oriented to person, place, and time. She appears well-developed and well-nourished. No distress.  Pt is in wheelchair, non-ambulatory  Cardiovascular: Normal rate, regular rhythm and intact distal pulses.    Pulmonary/Chest: Effort normal and breath sounds normal. No respiratory distress. She has no wheezes. She has no rhonchi.  Musculoskeletal:  General: Edema ( +1 B/L pedal edema) present.  Neurological: She is alert and oriented to person, place, and time.     A/P:  1. Essential hypertension - controlled, at goal - cont current med - Basic metabolic panel; Future  2. Spinal stenosis of lumbar region without neurogenic claudication - wheelchair bound, B/L foot drop, overall deconditioned - follows with [facility] and daughter states she may have surgery on her spine - last OV in 05/2019 - Daughter requests DME Rx for wheelchair as well as hoyer life. Patient has mobility limitations which cannot be resolved with a cane, crutch or walker. Patient can safely self propel the standard wheelchair or has a caregiver who can assist at all times. Patient requires a hoyer lift due to her spinal stenosis, generalized muscular deconditioning, and generalized OA.  3. Rheumatoid arthritis involving multiple sites with positive rheumatoid factor (HCC) - follows with rheum [facility])  4. Chronic combined systolic and diastolic CHF (congestive heart failure) (HCC) - cont on current dose of lasix - daily weights, low sodium deit - cont current meds  5. Anemia of chronic disease - CBC; Future - in 1 wk  6. Physical deconditioning - home PT, nursing is arranged - Daughter requests DME Rx for wheelchair as well as hoyer life. Patient has mobility limitations which cannot be resolved with a cane, crutch or walker. Patient can safely self propel the standard wheelchair or has a caregiver who can assist at all times. Patient requires a hoyer lift due to her spinal stenosis, generalized muscular deconditioning, and generalized OA.  7. Hyponatremia - sodium = 131 (08/21/19) - Basic metabolic panel; Future - in 1 wk  Will plan on q87mo f/u appts

## 2019-08-24 ENCOUNTER — Ambulatory Visit: Payer: Self-pay | Admitting: Pharmacist

## 2019-08-24 ENCOUNTER — Other Ambulatory Visit: Payer: Self-pay | Admitting: Pharmacist

## 2019-08-24 NOTE — Patient Outreach (Signed)
[facility] [facility]) Care Management  [place]  08/24/2019  [name] [name] 28-Feb-1930 [id number]   Reason for call: f/u on medication management after patient discharged home from SNF  Outreach:  Unsuccessful telephone call attempt #1 to patient.   HIPAA compliant voicemail left requesting a return call  Plan:  -I will make another outreach attempt to patient within 3-4 business days.    Ralene Bathe, PharmD, [place] [phone]

## 2019-08-25 ENCOUNTER — Telehealth: Payer: Self-pay

## 2019-08-25 DIAGNOSIS — N183 Chronic kidney disease, stage 3 unspecified: Secondary | ICD-10-CM

## 2019-08-25 DIAGNOSIS — I2510 Atherosclerotic heart disease of native coronary artery without angina pectoris: Secondary | ICD-10-CM | POA: Diagnosis not present

## 2019-08-25 DIAGNOSIS — M48061 Spinal stenosis, lumbar region without neurogenic claudication: Secondary | ICD-10-CM | POA: Diagnosis not present

## 2019-08-25 DIAGNOSIS — Z86718 Personal history of other venous thrombosis and embolism: Secondary | ICD-10-CM | POA: Diagnosis not present

## 2019-08-25 DIAGNOSIS — K5790 Diverticulosis of intestine, part unspecified, without perforation or abscess without bleeding: Secondary | ICD-10-CM | POA: Diagnosis not present

## 2019-08-25 DIAGNOSIS — M0589 Other rheumatoid arthritis with rheumatoid factor of multiple sites: Secondary | ICD-10-CM | POA: Diagnosis not present

## 2019-08-25 DIAGNOSIS — M1A9XX1 Chronic gout, unspecified, with tophus (tophi): Secondary | ICD-10-CM | POA: Diagnosis not present

## 2019-08-25 DIAGNOSIS — M7501 Adhesive capsulitis of right shoulder: Secondary | ICD-10-CM | POA: Diagnosis not present

## 2019-08-25 DIAGNOSIS — I4892 Unspecified atrial flutter: Secondary | ICD-10-CM | POA: Diagnosis not present

## 2019-08-25 DIAGNOSIS — M1990 Unspecified osteoarthritis, unspecified site: Secondary | ICD-10-CM | POA: Diagnosis not present

## 2019-08-25 DIAGNOSIS — I420 Dilated cardiomyopathy: Secondary | ICD-10-CM | POA: Diagnosis not present

## 2019-08-25 DIAGNOSIS — M797 Fibromyalgia: Secondary | ICD-10-CM | POA: Diagnosis not present

## 2019-08-25 DIAGNOSIS — H538 Other visual disturbances: Secondary | ICD-10-CM | POA: Diagnosis not present

## 2019-08-25 DIAGNOSIS — G8929 Other chronic pain: Secondary | ICD-10-CM | POA: Diagnosis not present

## 2019-08-25 DIAGNOSIS — R29898 Other symptoms and signs involving the musculoskeletal system: Secondary | ICD-10-CM

## 2019-08-25 DIAGNOSIS — G931 Anoxic brain damage, not elsewhere classified: Secondary | ICD-10-CM | POA: Diagnosis not present

## 2019-08-25 DIAGNOSIS — I69398 Other sequelae of cerebral infarction: Secondary | ICD-10-CM | POA: Diagnosis not present

## 2019-08-25 DIAGNOSIS — R1312 Dysphagia, oropharyngeal phase: Secondary | ICD-10-CM | POA: Diagnosis not present

## 2019-08-25 DIAGNOSIS — D631 Anemia in chronic kidney disease: Secondary | ICD-10-CM | POA: Diagnosis not present

## 2019-08-25 DIAGNOSIS — I130 Hypertensive heart and chronic kidney disease with heart failure and stage 1 through stage 4 chronic kidney disease, or unspecified chronic kidney disease: Secondary | ICD-10-CM | POA: Diagnosis not present

## 2019-08-25 DIAGNOSIS — I5042 Chronic combined systolic (congestive) and diastolic (congestive) heart failure: Secondary | ICD-10-CM | POA: Diagnosis not present

## 2019-08-25 DIAGNOSIS — E1151 Type 2 diabetes mellitus with diabetic peripheral angiopathy without gangrene: Secondary | ICD-10-CM | POA: Diagnosis not present

## 2019-08-25 DIAGNOSIS — E1122 Type 2 diabetes mellitus with diabetic chronic kidney disease: Secondary | ICD-10-CM

## 2019-08-25 NOTE — Addendum Note (Signed)
Addended by: Rodrigo Ran on: 08/25/2019 08:05 AM   Modules accepted: Orders

## 2019-08-25 NOTE — Telephone Encounter (Signed)
Okay for wheelchair order? I'll let [facility] know.    Copied from [place] [phone]. Topic: General - Inquiry >> Aug 24, 2019  4:12 PM Mathis Bud wrote: Reason for CRM: Patient daughter is requesting patient to get new wheel chair.  She is having trouble getting in and out of chair.  Daughter states she was in office yesterday 10/14 and PCP saw the struggle.  Daughter would like to make this urgent. Call back [phone]

## 2019-08-25 NOTE — Telephone Encounter (Signed)
Pt's daughter is also requesting an Rx for a hoyer lift, okay for order? I'll ask [facility] if they can send that as well with the wheelchair.

## 2019-08-25 NOTE — Telephone Encounter (Signed)
Yes ok to place order. Thank you

## 2019-08-25 NOTE — Telephone Encounter (Signed)
Added hoyer lift onto pt's current order through [facility].

## 2019-08-25 NOTE — Telephone Encounter (Signed)
Yes that it also ok to order. Thank you!

## 2019-08-25 NOTE — Telephone Encounter (Signed)
Order placed, message sent to Darlina Guys with [facility] for new wheelchair ASAP.

## 2019-08-27 DIAGNOSIS — M0589 Other rheumatoid arthritis with rheumatoid factor of multiple sites: Secondary | ICD-10-CM | POA: Diagnosis not present

## 2019-08-27 DIAGNOSIS — G8929 Other chronic pain: Secondary | ICD-10-CM | POA: Diagnosis not present

## 2019-08-27 DIAGNOSIS — H538 Other visual disturbances: Secondary | ICD-10-CM | POA: Diagnosis not present

## 2019-08-27 DIAGNOSIS — M48061 Spinal stenosis, lumbar region without neurogenic claudication: Secondary | ICD-10-CM | POA: Diagnosis not present

## 2019-08-27 DIAGNOSIS — I69398 Other sequelae of cerebral infarction: Secondary | ICD-10-CM | POA: Diagnosis not present

## 2019-08-27 DIAGNOSIS — G931 Anoxic brain damage, not elsewhere classified: Secondary | ICD-10-CM | POA: Diagnosis not present

## 2019-08-27 DIAGNOSIS — D631 Anemia in chronic kidney disease: Secondary | ICD-10-CM | POA: Diagnosis not present

## 2019-08-27 DIAGNOSIS — I2510 Atherosclerotic heart disease of native coronary artery without angina pectoris: Secondary | ICD-10-CM | POA: Diagnosis not present

## 2019-08-27 DIAGNOSIS — I4892 Unspecified atrial flutter: Secondary | ICD-10-CM | POA: Diagnosis not present

## 2019-08-27 DIAGNOSIS — I420 Dilated cardiomyopathy: Secondary | ICD-10-CM | POA: Diagnosis not present

## 2019-08-27 DIAGNOSIS — M1990 Unspecified osteoarthritis, unspecified site: Secondary | ICD-10-CM | POA: Diagnosis not present

## 2019-08-27 DIAGNOSIS — M7501 Adhesive capsulitis of right shoulder: Secondary | ICD-10-CM | POA: Diagnosis not present

## 2019-08-27 DIAGNOSIS — Z86718 Personal history of other venous thrombosis and embolism: Secondary | ICD-10-CM | POA: Diagnosis not present

## 2019-08-27 DIAGNOSIS — I5042 Chronic combined systolic (congestive) and diastolic (congestive) heart failure: Secondary | ICD-10-CM | POA: Diagnosis not present

## 2019-08-27 DIAGNOSIS — M1A9XX1 Chronic gout, unspecified, with tophus (tophi): Secondary | ICD-10-CM | POA: Diagnosis not present

## 2019-08-27 DIAGNOSIS — I130 Hypertensive heart and chronic kidney disease with heart failure and stage 1 through stage 4 chronic kidney disease, or unspecified chronic kidney disease: Secondary | ICD-10-CM | POA: Diagnosis not present

## 2019-08-27 DIAGNOSIS — N183 Chronic kidney disease, stage 3 unspecified: Secondary | ICD-10-CM | POA: Diagnosis not present

## 2019-08-27 DIAGNOSIS — R1312 Dysphagia, oropharyngeal phase: Secondary | ICD-10-CM | POA: Diagnosis not present

## 2019-08-27 DIAGNOSIS — K5790 Diverticulosis of intestine, part unspecified, without perforation or abscess without bleeding: Secondary | ICD-10-CM | POA: Diagnosis not present

## 2019-08-27 DIAGNOSIS — E1122 Type 2 diabetes mellitus with diabetic chronic kidney disease: Secondary | ICD-10-CM | POA: Diagnosis not present

## 2019-08-27 DIAGNOSIS — E1151 Type 2 diabetes mellitus with diabetic peripheral angiopathy without gangrene: Secondary | ICD-10-CM | POA: Diagnosis not present

## 2019-08-27 DIAGNOSIS — M797 Fibromyalgia: Secondary | ICD-10-CM | POA: Diagnosis not present

## 2019-08-28 ENCOUNTER — Encounter: Payer: Self-pay | Admitting: *Deleted

## 2019-08-28 ENCOUNTER — Other Ambulatory Visit: Payer: Self-pay

## 2019-08-28 ENCOUNTER — Other Ambulatory Visit: Payer: Self-pay | Admitting: *Deleted

## 2019-08-28 DIAGNOSIS — I824Z1 Acute embolism and thrombosis of unspecified deep veins of right distal lower extremity: Secondary | ICD-10-CM

## 2019-08-28 DIAGNOSIS — R29898 Other symptoms and signs involving the musculoskeletal system: Secondary | ICD-10-CM

## 2019-08-28 NOTE — Patient Outreach (Signed)
[facility] [facility]) Care Management  08/28/2019  KISTA ROBB [birth year] [id number]   CSW made a second attempt to try and contact patient's daughter, Excell Seltzer today to follow-up regarding social work services and resources for patient, as well as to ensure that patient had a smooth transition back home; however, Mrs. [name] was unavailable at the time of CSW's call.  CSW left a HIPAA compliant message for Mrs. Hampton on voicemail and is currently awaiting a return call.  CSW will mail an Unsuccessful Outreach Letter to patient's home, requesting that Mrs. Hampton contact [place] at her earliest convenience, if she is interested in continuing to receive social work services through [place] with Scientist, clinical (histocompatibility and immunogenetics).  Otherwise, CSW will make a third and final outreach attempt within the next 3-4 business days, if a return call is not received from Mrs. Hampton in the meantime.  [name], BSW, MSW, LCSW  Licensed Education officer, environmental Health System  Mailing [employer] [address], [place], [place] [address] Physical Address-[address], [place], [place] [address] Toll Free Main # [phone] Fax # [phone] Cell # [phone]  Office # [phone] [name]

## 2019-08-28 NOTE — Telephone Encounter (Signed)
Message sent to [facility] to follow up on request.

## 2019-08-28 NOTE — Telephone Encounter (Signed)
I called and spoke with daughter. She is aware that orders have been placed and [facility] is in the process of completing the orders.

## 2019-08-29 ENCOUNTER — Other Ambulatory Visit: Payer: Self-pay | Admitting: Pharmacist

## 2019-08-29 ENCOUNTER — Telehealth: Payer: Self-pay

## 2019-08-29 ENCOUNTER — Other Ambulatory Visit: Payer: Self-pay | Admitting: Family Medicine

## 2019-08-29 DIAGNOSIS — H538 Other visual disturbances: Secondary | ICD-10-CM | POA: Diagnosis not present

## 2019-08-29 DIAGNOSIS — D631 Anemia in chronic kidney disease: Secondary | ICD-10-CM | POA: Diagnosis not present

## 2019-08-29 DIAGNOSIS — K5790 Diverticulosis of intestine, part unspecified, without perforation or abscess without bleeding: Secondary | ICD-10-CM | POA: Diagnosis not present

## 2019-08-29 DIAGNOSIS — M48061 Spinal stenosis, lumbar region without neurogenic claudication: Secondary | ICD-10-CM | POA: Diagnosis not present

## 2019-08-29 DIAGNOSIS — E1122 Type 2 diabetes mellitus with diabetic chronic kidney disease: Secondary | ICD-10-CM | POA: Diagnosis not present

## 2019-08-29 DIAGNOSIS — M0589 Other rheumatoid arthritis with rheumatoid factor of multiple sites: Secondary | ICD-10-CM | POA: Diagnosis not present

## 2019-08-29 DIAGNOSIS — M7501 Adhesive capsulitis of right shoulder: Secondary | ICD-10-CM | POA: Diagnosis not present

## 2019-08-29 DIAGNOSIS — I4892 Unspecified atrial flutter: Secondary | ICD-10-CM | POA: Diagnosis not present

## 2019-08-29 DIAGNOSIS — N183 Chronic kidney disease, stage 3 unspecified: Secondary | ICD-10-CM | POA: Diagnosis not present

## 2019-08-29 DIAGNOSIS — G931 Anoxic brain damage, not elsewhere classified: Secondary | ICD-10-CM | POA: Diagnosis not present

## 2019-08-29 DIAGNOSIS — I130 Hypertensive heart and chronic kidney disease with heart failure and stage 1 through stage 4 chronic kidney disease, or unspecified chronic kidney disease: Secondary | ICD-10-CM | POA: Diagnosis not present

## 2019-08-29 DIAGNOSIS — Z86718 Personal history of other venous thrombosis and embolism: Secondary | ICD-10-CM | POA: Diagnosis not present

## 2019-08-29 DIAGNOSIS — G8929 Other chronic pain: Secondary | ICD-10-CM | POA: Diagnosis not present

## 2019-08-29 DIAGNOSIS — R1312 Dysphagia, oropharyngeal phase: Secondary | ICD-10-CM | POA: Diagnosis not present

## 2019-08-29 DIAGNOSIS — M1A9XX1 Chronic gout, unspecified, with tophus (tophi): Secondary | ICD-10-CM | POA: Diagnosis not present

## 2019-08-29 DIAGNOSIS — I69398 Other sequelae of cerebral infarction: Secondary | ICD-10-CM | POA: Diagnosis not present

## 2019-08-29 DIAGNOSIS — I5042 Chronic combined systolic (congestive) and diastolic (congestive) heart failure: Secondary | ICD-10-CM | POA: Diagnosis not present

## 2019-08-29 DIAGNOSIS — M797 Fibromyalgia: Secondary | ICD-10-CM | POA: Diagnosis not present

## 2019-08-29 DIAGNOSIS — E1151 Type 2 diabetes mellitus with diabetic peripheral angiopathy without gangrene: Secondary | ICD-10-CM | POA: Diagnosis not present

## 2019-08-29 DIAGNOSIS — M1990 Unspecified osteoarthritis, unspecified site: Secondary | ICD-10-CM | POA: Diagnosis not present

## 2019-08-29 DIAGNOSIS — I420 Dilated cardiomyopathy: Secondary | ICD-10-CM | POA: Diagnosis not present

## 2019-08-29 DIAGNOSIS — I2510 Atherosclerotic heart disease of native coronary artery without angina pectoris: Secondary | ICD-10-CM | POA: Diagnosis not present

## 2019-08-29 MED ORDER — BENZONATATE 100 MG PO CAPS: 100.0000 mg | ORAL_CAPSULE | Freq: Two times a day (BID) | ORAL | 0 refills | Status: DC | PRN

## 2019-08-29 NOTE — Patient Outreach (Signed)
[place] [facility]) Care Management  [place]  08/29/2019  COURTLYN AKI 1930-09-29 [id number]   Reason for call: f/u on medication management after patient discharged home from SNF  Outreach:  Unsuccessful telephone call attempt #2 to patient.   HIPAA compliant voicemail left requesting a return call  Plan:  -I will make another outreach attempt to patient within 3-4 business days.    Ralene Bathe, PharmD, [place] [phone]

## 2019-08-29 NOTE — Telephone Encounter (Signed)
Copied from [place] [phone]. Topic: General - Other >> Aug 29, 2019  9:26 AM Carolyn Stare wrote: Daughter [name] cal lto say pt has deep cough and is asking for a RX to help her with this cough she has been taking delsy

## 2019-08-29 NOTE — Telephone Encounter (Signed)
Non-productive, sounds like she is gagging, Delysm has not helped at all. No fever.  Wanting to know if tessalon pearls can be sent in to see if that will help?   Dr. Zigmund Daniel - can you advise in Dr. Lurline Del absence?    Copied from [place] [phone]. Topic: General - Other >> Aug 29, 2019  3:24 PM Rainey Pines A wrote: Patients daughter is requesting a callback today in regards to an RX being called in for patient. She can be reached at [phone]

## 2019-08-29 NOTE — Telephone Encounter (Signed)
Spoke with pt's daughter, she is aware of message below.  Dr. Dierdre Highman.

## 2019-08-29 NOTE — Telephone Encounter (Signed)
Tessalon ordered however if not improving, having shortness of breath, fever, increased swelling or fatigue she needs a visit.

## 2019-08-30 ENCOUNTER — Ambulatory Visit: Payer: Self-pay | Admitting: Pharmacist

## 2019-08-30 ENCOUNTER — Telehealth: Payer: Self-pay

## 2019-08-30 NOTE — Telephone Encounter (Signed)
I called and spoke with pt's daughter. We went over the information below. She is sleeping with her head slightly elevated. They will try the mucinex dm. They are still waiting on the wheelchair, will reach out to [facility] to see what the hold up is.

## 2019-08-30 NOTE — Telephone Encounter (Signed)
On review of chart, it appears Dr. Zigmund Daniel sent Rx for tessalon capsules

## 2019-08-30 NOTE — Telephone Encounter (Signed)
Please call daughter to advise her to ensure pt sleeping with head slightly elevated/on an incline. She can add mucinex (guaifenesin) or mucinex DM 1 tab twice per day. If pts cough continues to worsen, if she develops fever, SOB, she needs to be seen/eval

## 2019-08-30 NOTE — Telephone Encounter (Signed)
Copied from [place] [phone]. Topic: General - Other >> Aug 29, 2019  9:26 AM Carolyn Stare wrote: Daughter [name] cal lto say pt has deep cough and is asking for a RX to help her with this cough she has been taking delsy >> Aug 30, 2019  8:39 AM Jodie Echevaria wrote: Patient daughter [name] called back to say that cough medicine prescribed to her mom did not work because she had a rough night without sleep because she is up coughing so much. She is asking for the nurse to call her back so they can discuss the next step. She also want to discuss orders for wheelchair. Please advise Ph# [phone]

## 2019-08-30 NOTE — Addendum Note (Signed)
Addended by: Rodrigo Ran on: 08/30/2019 02:29 PM   Modules accepted: Orders

## 2019-08-31 DIAGNOSIS — M1A9XX1 Chronic gout, unspecified, with tophus (tophi): Secondary | ICD-10-CM | POA: Diagnosis not present

## 2019-08-31 DIAGNOSIS — E1151 Type 2 diabetes mellitus with diabetic peripheral angiopathy without gangrene: Secondary | ICD-10-CM | POA: Diagnosis not present

## 2019-08-31 DIAGNOSIS — E1122 Type 2 diabetes mellitus with diabetic chronic kidney disease: Secondary | ICD-10-CM | POA: Diagnosis not present

## 2019-08-31 DIAGNOSIS — D631 Anemia in chronic kidney disease: Secondary | ICD-10-CM | POA: Diagnosis not present

## 2019-08-31 DIAGNOSIS — I4892 Unspecified atrial flutter: Secondary | ICD-10-CM | POA: Diagnosis not present

## 2019-08-31 DIAGNOSIS — M7501 Adhesive capsulitis of right shoulder: Secondary | ICD-10-CM | POA: Diagnosis not present

## 2019-08-31 DIAGNOSIS — N183 Chronic kidney disease, stage 3 unspecified: Secondary | ICD-10-CM | POA: Diagnosis not present

## 2019-08-31 DIAGNOSIS — M4800 Spinal stenosis, site unspecified: Secondary | ICD-10-CM | POA: Diagnosis not present

## 2019-08-31 DIAGNOSIS — I5042 Chronic combined systolic (congestive) and diastolic (congestive) heart failure: Secondary | ICD-10-CM | POA: Diagnosis not present

## 2019-08-31 DIAGNOSIS — I2510 Atherosclerotic heart disease of native coronary artery without angina pectoris: Secondary | ICD-10-CM | POA: Diagnosis not present

## 2019-08-31 DIAGNOSIS — G931 Anoxic brain damage, not elsewhere classified: Secondary | ICD-10-CM | POA: Diagnosis not present

## 2019-08-31 DIAGNOSIS — Z86718 Personal history of other venous thrombosis and embolism: Secondary | ICD-10-CM | POA: Diagnosis not present

## 2019-08-31 DIAGNOSIS — M797 Fibromyalgia: Secondary | ICD-10-CM | POA: Diagnosis not present

## 2019-08-31 DIAGNOSIS — I130 Hypertensive heart and chronic kidney disease with heart failure and stage 1 through stage 4 chronic kidney disease, or unspecified chronic kidney disease: Secondary | ICD-10-CM | POA: Diagnosis not present

## 2019-08-31 DIAGNOSIS — M48061 Spinal stenosis, lumbar region without neurogenic claudication: Secondary | ICD-10-CM | POA: Diagnosis not present

## 2019-08-31 DIAGNOSIS — K5790 Diverticulosis of intestine, part unspecified, without perforation or abscess without bleeding: Secondary | ICD-10-CM | POA: Diagnosis not present

## 2019-08-31 DIAGNOSIS — I69398 Other sequelae of cerebral infarction: Secondary | ICD-10-CM | POA: Diagnosis not present

## 2019-08-31 DIAGNOSIS — H538 Other visual disturbances: Secondary | ICD-10-CM | POA: Diagnosis not present

## 2019-08-31 DIAGNOSIS — G8929 Other chronic pain: Secondary | ICD-10-CM | POA: Diagnosis not present

## 2019-08-31 DIAGNOSIS — M0589 Other rheumatoid arthritis with rheumatoid factor of multiple sites: Secondary | ICD-10-CM | POA: Diagnosis not present

## 2019-08-31 DIAGNOSIS — M1990 Unspecified osteoarthritis, unspecified site: Secondary | ICD-10-CM | POA: Diagnosis not present

## 2019-08-31 DIAGNOSIS — R1312 Dysphagia, oropharyngeal phase: Secondary | ICD-10-CM | POA: Diagnosis not present

## 2019-08-31 DIAGNOSIS — I420 Dilated cardiomyopathy: Secondary | ICD-10-CM | POA: Diagnosis not present

## 2019-09-01 ENCOUNTER — Ambulatory Visit: Payer: Self-pay | Admitting: Pharmacist

## 2019-09-01 ENCOUNTER — Other Ambulatory Visit: Payer: Self-pay | Admitting: Pharmacist

## 2019-09-01 ENCOUNTER — Other Ambulatory Visit: Payer: Self-pay | Admitting: *Deleted

## 2019-09-01 NOTE — Patient Outreach (Signed)
[name]) Care Management  09/01/2019  [name] [name] 04/19/1930 [id number]   CSW made a third and final attempt to try and contact patient's daughter, Excell Seltzer today to follow-up regarding social work services and resources for patient, as well as to ensure that patient had a smooth transition back home; however, Mrs. [name] was unavailable at the time of CSW's call.  CSW left a HIPAA compliant message on voicemail for Mrs. [name] and is currently awaiting a return call.  If CSW does not receive a return call from Mrs. [name] within the next 2 business days, CSW will perform a case closure.  CSW will have allowed 10 business days for a response after outreach letter was mailed and 3 calls attempts made.  [name], BSW, MSW, LCSW  Licensed Education officer, environmental Health System  Mailing [name], [place], [place] [address] Physical Address-[address], [place], [place] [address] Toll Free Main # [phone] Fax # [phone] Cell # [phone]  Office # [phone] [name]

## 2019-09-01 NOTE — Patient Outreach (Signed)
[place] [facility]) Care Management  [place]  09/01/2019  RAISSA DAM 1930/04/29 [id number]   Reason for call: f/u on medication management after patient discharged home from SNF  Outreach:  Unsuccessful telephone call attempt #3 to patient.   HIPAA compliant voicemail left requesting a return call  Plan:  -I will close [place] case at this time as I have been unable to establish and/or maintain contact with patient.  -I am happy to assist in the future as needed.    Ralene Bathe, PharmD, [place] [phone]

## 2019-09-04 ENCOUNTER — Ambulatory Visit: Payer: Medicare Other | Admitting: Cardiovascular Disease

## 2019-09-04 DIAGNOSIS — D631 Anemia in chronic kidney disease: Secondary | ICD-10-CM | POA: Diagnosis not present

## 2019-09-04 DIAGNOSIS — I2510 Atherosclerotic heart disease of native coronary artery without angina pectoris: Secondary | ICD-10-CM | POA: Diagnosis not present

## 2019-09-04 DIAGNOSIS — I4892 Unspecified atrial flutter: Secondary | ICD-10-CM | POA: Diagnosis not present

## 2019-09-04 DIAGNOSIS — G8929 Other chronic pain: Secondary | ICD-10-CM | POA: Diagnosis not present

## 2019-09-04 DIAGNOSIS — M1A9XX1 Chronic gout, unspecified, with tophus (tophi): Secondary | ICD-10-CM | POA: Diagnosis not present

## 2019-09-04 DIAGNOSIS — K5790 Diverticulosis of intestine, part unspecified, without perforation or abscess without bleeding: Secondary | ICD-10-CM | POA: Diagnosis not present

## 2019-09-04 DIAGNOSIS — E1122 Type 2 diabetes mellitus with diabetic chronic kidney disease: Secondary | ICD-10-CM | POA: Diagnosis not present

## 2019-09-04 DIAGNOSIS — H538 Other visual disturbances: Secondary | ICD-10-CM | POA: Diagnosis not present

## 2019-09-04 DIAGNOSIS — I69398 Other sequelae of cerebral infarction: Secondary | ICD-10-CM | POA: Diagnosis not present

## 2019-09-04 DIAGNOSIS — R1312 Dysphagia, oropharyngeal phase: Secondary | ICD-10-CM | POA: Diagnosis not present

## 2019-09-04 DIAGNOSIS — G931 Anoxic brain damage, not elsewhere classified: Secondary | ICD-10-CM | POA: Diagnosis not present

## 2019-09-04 DIAGNOSIS — M797 Fibromyalgia: Secondary | ICD-10-CM | POA: Diagnosis not present

## 2019-09-04 DIAGNOSIS — I130 Hypertensive heart and chronic kidney disease with heart failure and stage 1 through stage 4 chronic kidney disease, or unspecified chronic kidney disease: Secondary | ICD-10-CM | POA: Diagnosis not present

## 2019-09-04 DIAGNOSIS — I420 Dilated cardiomyopathy: Secondary | ICD-10-CM | POA: Diagnosis not present

## 2019-09-04 DIAGNOSIS — N183 Chronic kidney disease, stage 3 unspecified: Secondary | ICD-10-CM | POA: Diagnosis not present

## 2019-09-04 DIAGNOSIS — M48061 Spinal stenosis, lumbar region without neurogenic claudication: Secondary | ICD-10-CM | POA: Diagnosis not present

## 2019-09-04 DIAGNOSIS — M7501 Adhesive capsulitis of right shoulder: Secondary | ICD-10-CM | POA: Diagnosis not present

## 2019-09-04 DIAGNOSIS — I5042 Chronic combined systolic (congestive) and diastolic (congestive) heart failure: Secondary | ICD-10-CM | POA: Diagnosis not present

## 2019-09-04 DIAGNOSIS — E1151 Type 2 diabetes mellitus with diabetic peripheral angiopathy without gangrene: Secondary | ICD-10-CM | POA: Diagnosis not present

## 2019-09-04 DIAGNOSIS — Z86718 Personal history of other venous thrombosis and embolism: Secondary | ICD-10-CM | POA: Diagnosis not present

## 2019-09-04 DIAGNOSIS — M1990 Unspecified osteoarthritis, unspecified site: Secondary | ICD-10-CM | POA: Diagnosis not present

## 2019-09-04 DIAGNOSIS — M0589 Other rheumatoid arthritis with rheumatoid factor of multiple sites: Secondary | ICD-10-CM | POA: Diagnosis not present

## 2019-09-05 ENCOUNTER — Encounter: Payer: Self-pay | Admitting: *Deleted

## 2019-09-05 ENCOUNTER — Other Ambulatory Visit: Payer: Self-pay | Admitting: *Deleted

## 2019-09-05 DIAGNOSIS — M1A9XX1 Chronic gout, unspecified, with tophus (tophi): Secondary | ICD-10-CM | POA: Diagnosis not present

## 2019-09-05 DIAGNOSIS — I4892 Unspecified atrial flutter: Secondary | ICD-10-CM | POA: Diagnosis not present

## 2019-09-05 DIAGNOSIS — K5790 Diverticulosis of intestine, part unspecified, without perforation or abscess without bleeding: Secondary | ICD-10-CM | POA: Diagnosis not present

## 2019-09-05 DIAGNOSIS — N183 Chronic kidney disease, stage 3 unspecified: Secondary | ICD-10-CM | POA: Diagnosis not present

## 2019-09-05 DIAGNOSIS — M1990 Unspecified osteoarthritis, unspecified site: Secondary | ICD-10-CM | POA: Diagnosis not present

## 2019-09-05 DIAGNOSIS — M0589 Other rheumatoid arthritis with rheumatoid factor of multiple sites: Secondary | ICD-10-CM | POA: Diagnosis not present

## 2019-09-05 DIAGNOSIS — H538 Other visual disturbances: Secondary | ICD-10-CM | POA: Diagnosis not present

## 2019-09-05 DIAGNOSIS — I5042 Chronic combined systolic (congestive) and diastolic (congestive) heart failure: Secondary | ICD-10-CM | POA: Diagnosis not present

## 2019-09-05 DIAGNOSIS — I2510 Atherosclerotic heart disease of native coronary artery without angina pectoris: Secondary | ICD-10-CM | POA: Diagnosis not present

## 2019-09-05 DIAGNOSIS — Z86718 Personal history of other venous thrombosis and embolism: Secondary | ICD-10-CM | POA: Diagnosis not present

## 2019-09-05 DIAGNOSIS — I130 Hypertensive heart and chronic kidney disease with heart failure and stage 1 through stage 4 chronic kidney disease, or unspecified chronic kidney disease: Secondary | ICD-10-CM | POA: Diagnosis not present

## 2019-09-05 DIAGNOSIS — E1151 Type 2 diabetes mellitus with diabetic peripheral angiopathy without gangrene: Secondary | ICD-10-CM | POA: Diagnosis not present

## 2019-09-05 DIAGNOSIS — M797 Fibromyalgia: Secondary | ICD-10-CM | POA: Diagnosis not present

## 2019-09-05 DIAGNOSIS — I420 Dilated cardiomyopathy: Secondary | ICD-10-CM | POA: Diagnosis not present

## 2019-09-05 DIAGNOSIS — G931 Anoxic brain damage, not elsewhere classified: Secondary | ICD-10-CM | POA: Diagnosis not present

## 2019-09-05 DIAGNOSIS — D631 Anemia in chronic kidney disease: Secondary | ICD-10-CM | POA: Diagnosis not present

## 2019-09-05 DIAGNOSIS — G8929 Other chronic pain: Secondary | ICD-10-CM | POA: Diagnosis not present

## 2019-09-05 DIAGNOSIS — M48061 Spinal stenosis, lumbar region without neurogenic claudication: Secondary | ICD-10-CM | POA: Diagnosis not present

## 2019-09-05 DIAGNOSIS — I69398 Other sequelae of cerebral infarction: Secondary | ICD-10-CM | POA: Diagnosis not present

## 2019-09-05 DIAGNOSIS — M7501 Adhesive capsulitis of right shoulder: Secondary | ICD-10-CM | POA: Diagnosis not present

## 2019-09-05 DIAGNOSIS — E1122 Type 2 diabetes mellitus with diabetic chronic kidney disease: Secondary | ICD-10-CM | POA: Diagnosis not present

## 2019-09-05 DIAGNOSIS — R1312 Dysphagia, oropharyngeal phase: Secondary | ICD-10-CM | POA: Diagnosis not present

## 2019-09-05 NOTE — Patient Outreach (Signed)
[place] [facility]) Care Management  09/05/2019  [name] [name] 08-17-1930 [id number]   CSW will perform a case closure on patient, due to inability to maintain phone contact with patient and patient's daughter, Excell Seltzer, despite required number of phone attempts made and Outreach Letter mailed to patient's home, allowing 10 days for a response if patient or Mrs. [name] [name] were interested in continuing to receive social work services through [place] with Scientist, clinical (histocompatibility and immunogenetics).  CSW will notify patient's Geriatric Nurse Practitioner, Deloria Lair, also with [place] [facility], of CSW's plans to close patient's case.  CSW will fax an update to patient's Primary Care Physician, Dr. Letta Median to ensure that they are aware of CSW's involvement with patient's plan of care.    [name], BSW, MSW, LCSW  Licensed Education officer, environmental Health System  Mailing [place] [address] [place], [place] [address] Physical Address-[address], [place], [place] [address] Toll Free Main # [phone] Fax # [phone] Cell # [phone]  Office # [phone] [name]

## 2019-09-06 ENCOUNTER — Telehealth: Payer: Self-pay

## 2019-09-06 DIAGNOSIS — M1990 Unspecified osteoarthritis, unspecified site: Secondary | ICD-10-CM | POA: Diagnosis not present

## 2019-09-06 DIAGNOSIS — N183 Chronic kidney disease, stage 3 unspecified: Secondary | ICD-10-CM | POA: Diagnosis not present

## 2019-09-06 DIAGNOSIS — M7501 Adhesive capsulitis of right shoulder: Secondary | ICD-10-CM | POA: Diagnosis not present

## 2019-09-06 DIAGNOSIS — I130 Hypertensive heart and chronic kidney disease with heart failure and stage 1 through stage 4 chronic kidney disease, or unspecified chronic kidney disease: Secondary | ICD-10-CM | POA: Diagnosis not present

## 2019-09-06 DIAGNOSIS — E1122 Type 2 diabetes mellitus with diabetic chronic kidney disease: Secondary | ICD-10-CM | POA: Diagnosis not present

## 2019-09-06 DIAGNOSIS — Z86718 Personal history of other venous thrombosis and embolism: Secondary | ICD-10-CM | POA: Diagnosis not present

## 2019-09-06 DIAGNOSIS — M0589 Other rheumatoid arthritis with rheumatoid factor of multiple sites: Secondary | ICD-10-CM | POA: Diagnosis not present

## 2019-09-06 DIAGNOSIS — E1151 Type 2 diabetes mellitus with diabetic peripheral angiopathy without gangrene: Secondary | ICD-10-CM | POA: Diagnosis not present

## 2019-09-06 DIAGNOSIS — H538 Other visual disturbances: Secondary | ICD-10-CM | POA: Diagnosis not present

## 2019-09-06 DIAGNOSIS — M797 Fibromyalgia: Secondary | ICD-10-CM | POA: Diagnosis not present

## 2019-09-06 DIAGNOSIS — K5790 Diverticulosis of intestine, part unspecified, without perforation or abscess without bleeding: Secondary | ICD-10-CM | POA: Diagnosis not present

## 2019-09-06 DIAGNOSIS — I420 Dilated cardiomyopathy: Secondary | ICD-10-CM | POA: Diagnosis not present

## 2019-09-06 DIAGNOSIS — M48061 Spinal stenosis, lumbar region without neurogenic claudication: Secondary | ICD-10-CM | POA: Diagnosis not present

## 2019-09-06 DIAGNOSIS — R1312 Dysphagia, oropharyngeal phase: Secondary | ICD-10-CM | POA: Diagnosis not present

## 2019-09-06 DIAGNOSIS — G931 Anoxic brain damage, not elsewhere classified: Secondary | ICD-10-CM | POA: Diagnosis not present

## 2019-09-06 DIAGNOSIS — D631 Anemia in chronic kidney disease: Secondary | ICD-10-CM | POA: Diagnosis not present

## 2019-09-06 DIAGNOSIS — I69398 Other sequelae of cerebral infarction: Secondary | ICD-10-CM | POA: Diagnosis not present

## 2019-09-06 DIAGNOSIS — G8929 Other chronic pain: Secondary | ICD-10-CM | POA: Diagnosis not present

## 2019-09-06 DIAGNOSIS — M1A9XX1 Chronic gout, unspecified, with tophus (tophi): Secondary | ICD-10-CM | POA: Diagnosis not present

## 2019-09-06 DIAGNOSIS — I2510 Atherosclerotic heart disease of native coronary artery without angina pectoris: Secondary | ICD-10-CM | POA: Diagnosis not present

## 2019-09-06 DIAGNOSIS — I5042 Chronic combined systolic (congestive) and diastolic (congestive) heart failure: Secondary | ICD-10-CM | POA: Diagnosis not present

## 2019-09-06 DIAGNOSIS — I4892 Unspecified atrial flutter: Secondary | ICD-10-CM | POA: Diagnosis not present

## 2019-09-06 MED ORDER — ACCU-CHEK SOFT TOUCH LANCETS MISC: 12 refills | Status: DC

## 2019-09-06 MED ORDER — ACCU-CHEK AVIVA PLUS VI STRP: ORAL_STRIP | 12 refills | Status: DC

## 2019-09-06 MED ORDER — ACCU-CHEK AVIVA PLUS W/DEVICE KIT: PACK | 0 refills | Status: DC

## 2019-09-06 NOTE — Telephone Encounter (Signed)
Rx's sent in for meter, strips & lancets.   Copy of lab orders faxed to [facility] at [phone] as requested by [facility].

## 2019-09-06 NOTE — Telephone Encounter (Signed)
Okay for both? They can go out and draw the labs so she doesn't have to come back in for those.  Copied from [place] [phone]. Topic: General - Other >> Sep 06, 2019 11:13 AM Yvette Rack wrote: Reason for CRM: Leda Gauze with [facility] stated pt does not have a glucose meter and requests Rx be sent for a meter. Leda Gauze also stated pt daughter told her that pt needs to have labs drawn so an order for labs is needed. Cb# [phone]

## 2019-09-06 NOTE — Telephone Encounter (Signed)
Yes ok for both labs (CBC, BMP) and glucometer

## 2019-09-06 NOTE — Addendum Note (Signed)
Addended by: Nathanial Millman E on: 09/06/2019 01:17 PM   Modules accepted: Orders

## 2019-09-07 DIAGNOSIS — K5790 Diverticulosis of intestine, part unspecified, without perforation or abscess without bleeding: Secondary | ICD-10-CM | POA: Diagnosis not present

## 2019-09-07 DIAGNOSIS — R1312 Dysphagia, oropharyngeal phase: Secondary | ICD-10-CM | POA: Diagnosis not present

## 2019-09-07 DIAGNOSIS — I5042 Chronic combined systolic (congestive) and diastolic (congestive) heart failure: Secondary | ICD-10-CM | POA: Diagnosis not present

## 2019-09-07 DIAGNOSIS — E871 Hypo-osmolality and hyponatremia: Secondary | ICD-10-CM | POA: Diagnosis not present

## 2019-09-07 DIAGNOSIS — H538 Other visual disturbances: Secondary | ICD-10-CM | POA: Diagnosis not present

## 2019-09-07 DIAGNOSIS — M48061 Spinal stenosis, lumbar region without neurogenic claudication: Secondary | ICD-10-CM | POA: Diagnosis not present

## 2019-09-07 DIAGNOSIS — M1990 Unspecified osteoarthritis, unspecified site: Secondary | ICD-10-CM | POA: Diagnosis not present

## 2019-09-07 DIAGNOSIS — I69398 Other sequelae of cerebral infarction: Secondary | ICD-10-CM | POA: Diagnosis not present

## 2019-09-07 DIAGNOSIS — I10 Essential (primary) hypertension: Secondary | ICD-10-CM | POA: Diagnosis not present

## 2019-09-07 DIAGNOSIS — I4892 Unspecified atrial flutter: Secondary | ICD-10-CM | POA: Diagnosis not present

## 2019-09-07 DIAGNOSIS — D649 Anemia, unspecified: Secondary | ICD-10-CM | POA: Diagnosis not present

## 2019-09-07 DIAGNOSIS — Z86718 Personal history of other venous thrombosis and embolism: Secondary | ICD-10-CM | POA: Diagnosis not present

## 2019-09-07 DIAGNOSIS — E1122 Type 2 diabetes mellitus with diabetic chronic kidney disease: Secondary | ICD-10-CM | POA: Diagnosis not present

## 2019-09-07 DIAGNOSIS — M797 Fibromyalgia: Secondary | ICD-10-CM | POA: Diagnosis not present

## 2019-09-07 DIAGNOSIS — G931 Anoxic brain damage, not elsewhere classified: Secondary | ICD-10-CM | POA: Diagnosis not present

## 2019-09-07 DIAGNOSIS — N183 Chronic kidney disease, stage 3 unspecified: Secondary | ICD-10-CM | POA: Diagnosis not present

## 2019-09-07 DIAGNOSIS — M7501 Adhesive capsulitis of right shoulder: Secondary | ICD-10-CM | POA: Diagnosis not present

## 2019-09-07 DIAGNOSIS — I130 Hypertensive heart and chronic kidney disease with heart failure and stage 1 through stage 4 chronic kidney disease, or unspecified chronic kidney disease: Secondary | ICD-10-CM | POA: Diagnosis not present

## 2019-09-07 DIAGNOSIS — M0589 Other rheumatoid arthritis with rheumatoid factor of multiple sites: Secondary | ICD-10-CM | POA: Diagnosis not present

## 2019-09-07 DIAGNOSIS — I2510 Atherosclerotic heart disease of native coronary artery without angina pectoris: Secondary | ICD-10-CM | POA: Diagnosis not present

## 2019-09-07 DIAGNOSIS — E1151 Type 2 diabetes mellitus with diabetic peripheral angiopathy without gangrene: Secondary | ICD-10-CM | POA: Diagnosis not present

## 2019-09-07 DIAGNOSIS — I420 Dilated cardiomyopathy: Secondary | ICD-10-CM | POA: Diagnosis not present

## 2019-09-07 DIAGNOSIS — M1A9XX1 Chronic gout, unspecified, with tophus (tophi): Secondary | ICD-10-CM | POA: Diagnosis not present

## 2019-09-07 DIAGNOSIS — D631 Anemia in chronic kidney disease: Secondary | ICD-10-CM | POA: Diagnosis not present

## 2019-09-07 DIAGNOSIS — G8929 Other chronic pain: Secondary | ICD-10-CM | POA: Diagnosis not present

## 2019-09-08 DIAGNOSIS — H35033 Hypertensive retinopathy, bilateral: Secondary | ICD-10-CM | POA: Diagnosis not present

## 2019-09-08 DIAGNOSIS — M797 Fibromyalgia: Secondary | ICD-10-CM | POA: Diagnosis not present

## 2019-09-08 DIAGNOSIS — Z79899 Other long term (current) drug therapy: Secondary | ICD-10-CM | POA: Diagnosis not present

## 2019-09-08 DIAGNOSIS — D631 Anemia in chronic kidney disease: Secondary | ICD-10-CM | POA: Diagnosis not present

## 2019-09-08 DIAGNOSIS — K5790 Diverticulosis of intestine, part unspecified, without perforation or abscess without bleeding: Secondary | ICD-10-CM | POA: Diagnosis not present

## 2019-09-08 DIAGNOSIS — H538 Other visual disturbances: Secondary | ICD-10-CM | POA: Diagnosis not present

## 2019-09-08 DIAGNOSIS — I5042 Chronic combined systolic (congestive) and diastolic (congestive) heart failure: Secondary | ICD-10-CM | POA: Diagnosis not present

## 2019-09-08 DIAGNOSIS — G8929 Other chronic pain: Secondary | ICD-10-CM | POA: Diagnosis not present

## 2019-09-08 DIAGNOSIS — M1A9XX1 Chronic gout, unspecified, with tophus (tophi): Secondary | ICD-10-CM | POA: Diagnosis not present

## 2019-09-08 DIAGNOSIS — E1122 Type 2 diabetes mellitus with diabetic chronic kidney disease: Secondary | ICD-10-CM | POA: Diagnosis not present

## 2019-09-08 DIAGNOSIS — H35363 Drusen (degenerative) of macula, bilateral: Secondary | ICD-10-CM | POA: Diagnosis not present

## 2019-09-08 DIAGNOSIS — M1990 Unspecified osteoarthritis, unspecified site: Secondary | ICD-10-CM | POA: Diagnosis not present

## 2019-09-08 DIAGNOSIS — N183 Chronic kidney disease, stage 3 unspecified: Secondary | ICD-10-CM | POA: Diagnosis not present

## 2019-09-08 DIAGNOSIS — I2510 Atherosclerotic heart disease of native coronary artery without angina pectoris: Secondary | ICD-10-CM | POA: Diagnosis not present

## 2019-09-08 DIAGNOSIS — I69398 Other sequelae of cerebral infarction: Secondary | ICD-10-CM | POA: Diagnosis not present

## 2019-09-08 DIAGNOSIS — M48061 Spinal stenosis, lumbar region without neurogenic claudication: Secondary | ICD-10-CM | POA: Diagnosis not present

## 2019-09-08 DIAGNOSIS — G931 Anoxic brain damage, not elsewhere classified: Secondary | ICD-10-CM | POA: Diagnosis not present

## 2019-09-08 DIAGNOSIS — I420 Dilated cardiomyopathy: Secondary | ICD-10-CM | POA: Diagnosis not present

## 2019-09-08 DIAGNOSIS — E1151 Type 2 diabetes mellitus with diabetic peripheral angiopathy without gangrene: Secondary | ICD-10-CM | POA: Diagnosis not present

## 2019-09-08 DIAGNOSIS — M0589 Other rheumatoid arthritis with rheumatoid factor of multiple sites: Secondary | ICD-10-CM | POA: Diagnosis not present

## 2019-09-08 DIAGNOSIS — I130 Hypertensive heart and chronic kidney disease with heart failure and stage 1 through stage 4 chronic kidney disease, or unspecified chronic kidney disease: Secondary | ICD-10-CM | POA: Diagnosis not present

## 2019-09-08 DIAGNOSIS — R1312 Dysphagia, oropharyngeal phase: Secondary | ICD-10-CM | POA: Diagnosis not present

## 2019-09-08 DIAGNOSIS — I4892 Unspecified atrial flutter: Secondary | ICD-10-CM | POA: Diagnosis not present

## 2019-09-08 DIAGNOSIS — Z86718 Personal history of other venous thrombosis and embolism: Secondary | ICD-10-CM | POA: Diagnosis not present

## 2019-09-08 DIAGNOSIS — M7501 Adhesive capsulitis of right shoulder: Secondary | ICD-10-CM | POA: Diagnosis not present

## 2019-09-08 DIAGNOSIS — E119 Type 2 diabetes mellitus without complications: Secondary | ICD-10-CM | POA: Diagnosis not present

## 2019-09-08 LAB — BASIC METABOLIC PANEL
BUN: 28 — AB (ref 4–21)
Creatinine: 0.9 (ref 0.5–1.1)
Glucose: 105
Potassium: 3.8 (ref 3.4–5.3)
Sodium: 137 (ref 137–147)

## 2019-09-08 LAB — CBC AND DIFFERENTIAL
HCT: 25 — AB (ref 36–46)
Hemoglobin: 7.9 — AB (ref 12.0–16.0)
Platelets: 313 (ref 150–399)
WBC: 6.2

## 2019-09-10 DIAGNOSIS — L88 Pyoderma gangrenosum: Secondary | ICD-10-CM | POA: Diagnosis not present

## 2019-09-10 DIAGNOSIS — M259 Joint disorder, unspecified: Secondary | ICD-10-CM | POA: Diagnosis not present

## 2019-09-10 DIAGNOSIS — L97212 Non-pressure chronic ulcer of right calf with fat layer exposed: Secondary | ICD-10-CM | POA: Diagnosis not present

## 2019-09-10 DIAGNOSIS — I10 Essential (primary) hypertension: Secondary | ICD-10-CM | POA: Diagnosis not present

## 2019-09-11 ENCOUNTER — Encounter: Payer: Self-pay | Admitting: Family Medicine

## 2019-09-11 DIAGNOSIS — G8929 Other chronic pain: Secondary | ICD-10-CM | POA: Diagnosis not present

## 2019-09-11 DIAGNOSIS — D631 Anemia in chronic kidney disease: Secondary | ICD-10-CM | POA: Diagnosis not present

## 2019-09-11 DIAGNOSIS — M1990 Unspecified osteoarthritis, unspecified site: Secondary | ICD-10-CM | POA: Diagnosis not present

## 2019-09-11 DIAGNOSIS — N183 Chronic kidney disease, stage 3 unspecified: Secondary | ICD-10-CM | POA: Diagnosis not present

## 2019-09-11 DIAGNOSIS — I2510 Atherosclerotic heart disease of native coronary artery without angina pectoris: Secondary | ICD-10-CM | POA: Diagnosis not present

## 2019-09-11 DIAGNOSIS — E1122 Type 2 diabetes mellitus with diabetic chronic kidney disease: Secondary | ICD-10-CM | POA: Diagnosis not present

## 2019-09-11 DIAGNOSIS — M797 Fibromyalgia: Secondary | ICD-10-CM | POA: Diagnosis not present

## 2019-09-11 DIAGNOSIS — I5042 Chronic combined systolic (congestive) and diastolic (congestive) heart failure: Secondary | ICD-10-CM | POA: Diagnosis not present

## 2019-09-11 DIAGNOSIS — M0589 Other rheumatoid arthritis with rheumatoid factor of multiple sites: Secondary | ICD-10-CM | POA: Diagnosis not present

## 2019-09-11 DIAGNOSIS — E1151 Type 2 diabetes mellitus with diabetic peripheral angiopathy without gangrene: Secondary | ICD-10-CM | POA: Diagnosis not present

## 2019-09-11 DIAGNOSIS — H538 Other visual disturbances: Secondary | ICD-10-CM | POA: Diagnosis not present

## 2019-09-11 DIAGNOSIS — I4892 Unspecified atrial flutter: Secondary | ICD-10-CM | POA: Diagnosis not present

## 2019-09-11 DIAGNOSIS — M7501 Adhesive capsulitis of right shoulder: Secondary | ICD-10-CM | POA: Diagnosis not present

## 2019-09-11 DIAGNOSIS — K5790 Diverticulosis of intestine, part unspecified, without perforation or abscess without bleeding: Secondary | ICD-10-CM | POA: Diagnosis not present

## 2019-09-11 DIAGNOSIS — I130 Hypertensive heart and chronic kidney disease with heart failure and stage 1 through stage 4 chronic kidney disease, or unspecified chronic kidney disease: Secondary | ICD-10-CM | POA: Diagnosis not present

## 2019-09-11 DIAGNOSIS — R1312 Dysphagia, oropharyngeal phase: Secondary | ICD-10-CM | POA: Diagnosis not present

## 2019-09-11 DIAGNOSIS — M1A9XX1 Chronic gout, unspecified, with tophus (tophi): Secondary | ICD-10-CM | POA: Diagnosis not present

## 2019-09-11 DIAGNOSIS — I420 Dilated cardiomyopathy: Secondary | ICD-10-CM | POA: Diagnosis not present

## 2019-09-11 DIAGNOSIS — G931 Anoxic brain damage, not elsewhere classified: Secondary | ICD-10-CM | POA: Diagnosis not present

## 2019-09-11 DIAGNOSIS — Z86718 Personal history of other venous thrombosis and embolism: Secondary | ICD-10-CM | POA: Diagnosis not present

## 2019-09-11 DIAGNOSIS — I69398 Other sequelae of cerebral infarction: Secondary | ICD-10-CM | POA: Diagnosis not present

## 2019-09-11 DIAGNOSIS — M48061 Spinal stenosis, lumbar region without neurogenic claudication: Secondary | ICD-10-CM | POA: Diagnosis not present

## 2019-09-12 ENCOUNTER — Telehealth: Payer: Self-pay | Admitting: Family Medicine

## 2019-09-12 ENCOUNTER — Other Ambulatory Visit: Payer: Self-pay | Admitting: *Deleted

## 2019-09-12 NOTE — Telephone Encounter (Signed)
Home Health Verbal Orders - Caller/Agency: [facility]  Callback Number: [phone] secure vm can be left w/ credentials  Requesting mobile Xray of Chest /Pt is having coughing fit/ no fever or shortness of breath

## 2019-09-12 NOTE — Patient Outreach (Signed)
Telephone outreach to follow up on pt after SNF stay.   Talked with pt's daughter, Excell Seltzer, who reports he is exhausted with the demand on her at this time and just cannot have a conservation at this time.  Encouraged her to take a deep breath and encouraged her that she also has to take care of herself.  She agrees to a call on Thursday morning.  [name]. [name], MSN, [facility] Gerontological Nurse Practitioner [facility] Care Management [phone]

## 2019-09-13 ENCOUNTER — Emergency Department (HOSPITAL_COMMUNITY): Payer: Medicare Other

## 2019-09-13 ENCOUNTER — Encounter (HOSPITAL_COMMUNITY): Payer: Self-pay | Admitting: Emergency Medicine

## 2019-09-13 ENCOUNTER — Other Ambulatory Visit: Payer: Self-pay

## 2019-09-13 ENCOUNTER — Emergency Department (HOSPITAL_COMMUNITY)
Admission: EM | Admit: 2019-09-13 | Discharge: 2019-09-13 | Disposition: A | Discharge status: Home / Self Care | Payer: Medicare Other | Attending: Emergency Medicine | Admitting: Emergency Medicine

## 2019-09-13 DIAGNOSIS — I10 Essential (primary) hypertension: Secondary | ICD-10-CM | POA: Diagnosis not present

## 2019-09-13 DIAGNOSIS — Z20828 Contact with and (suspected) exposure to other viral communicable diseases: Secondary | ICD-10-CM | POA: Diagnosis not present

## 2019-09-13 DIAGNOSIS — E1122 Type 2 diabetes mellitus with diabetic chronic kidney disease: Secondary | ICD-10-CM | POA: Insufficient documentation

## 2019-09-13 DIAGNOSIS — M2550 Pain in unspecified joint: Secondary | ICD-10-CM | POA: Diagnosis not present

## 2019-09-13 DIAGNOSIS — I5042 Chronic combined systolic (congestive) and diastolic (congestive) heart failure: Secondary | ICD-10-CM | POA: Insufficient documentation

## 2019-09-13 DIAGNOSIS — I130 Hypertensive heart and chronic kidney disease with heart failure and stage 1 through stage 4 chronic kidney disease, or unspecified chronic kidney disease: Secondary | ICD-10-CM | POA: Diagnosis not present

## 2019-09-13 DIAGNOSIS — Z79899 Other long term (current) drug therapy: Secondary | ICD-10-CM | POA: Insufficient documentation

## 2019-09-13 DIAGNOSIS — Z86718 Personal history of other venous thrombosis and embolism: Secondary | ICD-10-CM | POA: Diagnosis not present

## 2019-09-13 DIAGNOSIS — R05 Cough: Secondary | ICD-10-CM | POA: Diagnosis not present

## 2019-09-13 DIAGNOSIS — N183 Chronic kidney disease, stage 3 unspecified: Secondary | ICD-10-CM | POA: Diagnosis not present

## 2019-09-13 DIAGNOSIS — Z7901 Long term (current) use of anticoagulants: Secondary | ICD-10-CM | POA: Insufficient documentation

## 2019-09-13 DIAGNOSIS — Z7401 Bed confinement status: Secondary | ICD-10-CM | POA: Diagnosis not present

## 2019-09-13 DIAGNOSIS — R059 Cough, unspecified: Secondary | ICD-10-CM

## 2019-09-13 DIAGNOSIS — Z86711 Personal history of pulmonary embolism: Secondary | ICD-10-CM | POA: Diagnosis not present

## 2019-09-13 DIAGNOSIS — R5381 Other malaise: Secondary | ICD-10-CM | POA: Diagnosis not present

## 2019-09-13 DIAGNOSIS — Z743 Need for continuous supervision: Secondary | ICD-10-CM | POA: Diagnosis not present

## 2019-09-13 DIAGNOSIS — M069 Rheumatoid arthritis, unspecified: Secondary | ICD-10-CM | POA: Insufficient documentation

## 2019-09-13 LAB — CBC WITH DIFFERENTIAL/PLATELET
Abs Immature Granulocytes: 0.02 10*3/uL (ref 0.00–0.07)
Basophils Absolute: 0 10*3/uL (ref 0.0–0.1)
Basophils Relative: 1 %
Eosinophils Absolute: 0.2 10*3/uL (ref 0.0–0.5)
Eosinophils Relative: 3 %
HCT: 25.8 % — ABNORMAL LOW (ref 36.0–46.0)
Hemoglobin: 8.3 g/dL — ABNORMAL LOW (ref 12.0–15.0)
Immature Granulocytes: 0 %
Lymphocytes Relative: 32 %
Lymphs Abs: 2.1 10*3/uL (ref 0.7–4.0)
MCH: 32.4 pg (ref 26.0–34.0)
MCHC: 32.2 g/dL (ref 30.0–36.0)
MCV: 100.8 fL — ABNORMAL HIGH (ref 80.0–100.0)
Monocytes Absolute: 0.5 10*3/uL (ref 0.1–1.0)
Monocytes Relative: 7 %
Neutro Abs: 3.8 10*3/uL (ref 1.7–7.7)
Neutrophils Relative %: 57 %
Platelets: 294 10*3/uL (ref 150–400)
RBC: 2.56 MIL/uL — ABNORMAL LOW (ref 3.87–5.11)
RDW: 17.4 % — ABNORMAL HIGH (ref 11.5–15.5)
WBC: 6.6 10*3/uL (ref 4.0–10.5)
nRBC: 0 % (ref 0.0–0.2)

## 2019-09-13 LAB — BRAIN NATRIURETIC PEPTIDE: B Natriuretic Peptide: 921.6 pg/mL — ABNORMAL HIGH (ref 0.0–100.0)

## 2019-09-13 LAB — BASIC METABOLIC PANEL
Anion gap: 10 (ref 5–15)
BUN: 26 mg/dL — ABNORMAL HIGH (ref 8–23)
CO2: 24 mmol/L (ref 22–32)
Calcium: 8.7 mg/dL — ABNORMAL LOW (ref 8.9–10.3)
Chloride: 106 mmol/L (ref 98–111)
Creatinine, Ser: 1 mg/dL (ref 0.44–1.00)
GFR calc Af Amer: 59 mL/min — ABNORMAL LOW (ref >60)
GFR calc non Af Amer: 51 mL/min — ABNORMAL LOW (ref >60)
Glucose, Bld: 82 mg/dL (ref 70–99)
Potassium: 3.2 mmol/L — ABNORMAL LOW (ref 3.5–5.1)
Sodium: 140 mmol/L (ref 135–145)

## 2019-09-13 LAB — SARS CORONAVIRUS 2 (TAT 6-24 HRS): SARS Coronavirus 2: NEGATIVE

## 2019-09-13 MED ORDER — GUAIFENESIN-CODEINE 100-10 MG/5ML PO SYRP: 5.0000 mL | ORAL_SOLUTION | Freq: Two times a day (BID) | ORAL | 0 refills | Status: DC | PRN

## 2019-09-13 NOTE — ED Notes (Signed)
[facility] called @ 1426-per RN called by Levada Dy

## 2019-09-13 NOTE — Telephone Encounter (Signed)
Left verbal on [name] [name]'s voicemail.

## 2019-09-13 NOTE — ED Provider Notes (Signed)
[facility] EMERGENCY DEPARTMENT Provider Note   CSN: [id number] Arrival date & time: 09/13/19  0720     History   Chief Complaint Chief Complaint  Patient presents with  . Cough    HPI [name] [name] is a 85 y.o. female.     HPI   85 year old female with cough.  Onset about 2 weeks ago.  Intermittent.  Occasionally brings up "water."  Does not feel short of breath though.  Denies any chest or back pain.  No fevers or chills.  No sick contacts that she is aware of.  She has been using Tessalon intermittently without much improvement.  Coming in today because she will occasionally have coughing fits to the point that she will be a little bit incontinent of stool.  Past Medical History:  Diagnosis Date  . Allergic rhinitis due to pollen 04/27/2007  . Arthritis    "in q joint" (08/07/2013)  . CHF (congestive heart failure) ([place])   . Coronary atherosclerosis of native coronary artery   . Cough   . Edema 05/03/2013  . Exertional shortness of breath    "sometimes" (08/07/2013)  . First degree atrioventricular block   . Gout, unspecified 04/19/2013  . Hyperlipidemia 04/27/2007  . Hypertension   . Midline low back pain without sciatica 06/27/2014  . Muscle spasm   . Muscle weakness (generalized)   . Onychia and paronychia of toe   . Osteoarthrosis, unspecified whether generalized or localized, unspecified site 04/27/2007  . Other fall   . Other malaise and fatigue   . Pneumonia 05/2018  . Prepatellar bursitis   . Pulmonary embolism ([place]) 07/2018  . Rheumatic nodule ([place]) 06/28/2017  . Seizures ([place])   . Stroke ([place]) 01/17/2014  . Type II diabetes mellitus ([place])    "fasting 90-110s" (08/07/2013)  . Unspecified vitamin D deficiency     Patient Active Problem List   Diagnosis Date Noted  . Pre-operative clearance 05/29/2019  . Volume overload 05/29/2019  . Acute metabolic encephalopathy 26/83/4196  . Pressure injury of skin 01/12/2019  . AMS (altered  mental status) 01/09/2019  . Rheumatoid arthritis flare ([place]) 12/23/2018  . Weakness of right lower extremity 12/23/2018  . Abscess of right upper extremity 12/14/2018  . Fever 12/13/2018  . Hand swelling 12/02/2018  . Pain, wrist 12/02/2018  . Hand pain 12/02/2018  . Polyarthritis 12/02/2018  . Spinal stenosis, lumbar 11/19/2018  . Acute encephalopathy 11/19/2018  . History of CVA (cerebrovascular accident) 11/19/2018  . Acute gout 11/15/2018  . Low back pain 11/14/2018  . Chronic back pain 10/27/2018  . Arthralgia of both knees 10/27/2018  . Rheumatoid arthritis involving both knees ([place]) 10/27/2018  . Acute pain 10/21/2018  . Physical deconditioning 09/06/2018  . Hyperlipidemia associated with type 2 diabetes mellitus ([place]), refuses statin 09/06/2018  . Refusal of statin medication by patient 09/04/2018  . Deep vein thrombosis (DVT) of distal vein of right lower extremity ([place]) 07/30/2018  . Hypertensive heart disease with heart failure ([place]) 07/30/2018  . Pulmonary embolism ([place]), 07/2018, on Xarelto with goal to DC after 6 months if more active 07/10/2018  . Elevated brain natriuretic peptide (BNP) level   . DCM (dilated cardiomyopathy) ([place])   . Benign hypertensive heart and kidney disease with CHF and stage 3 chronic kidney disease ([place]) 05/28/2018  . Osteoarthritis 05/06/2018  . Hypokalemia 02/02/2018  . Adhesive capsulitis of right shoulder 10/12/2017  . Idiopathic chronic gout of multiple sites with tophus 08/25/2017  . History  of total knee arthroplasty, bilateral 02/18/2017  . History of rotator cuff surgery 02/18/2017  . High risk medications, long-term use 09/21/2016  . Chronic combined systolic and diastolic CHF (congestive heart failure) ([place]) 09/12/2015  . Bilateral edema of lower extremity 06/05/2014  . CKD stage 3 due to type 2 diabetes mellitus ([place]) 06/05/2014  . DM (diabetes mellitus), type 2 with renal complications ([place]) 41/93/7902    Class: Chronic  .  Anemia of chronic disease 05/01/2014  . CAD (coronary artery disease) 10/18/2013  . Hypertension associated with diabetes ([place]) 08/08/2013    Class: Chronic  . Fibromyalgia 05/10/2013  . Type 2 diabetes mellitus with hyperlipidemia ([place]), patient declines statin 04/27/2007  . Allergic rhinitis 04/27/2007  . Diverticulosis 04/27/2007    Past Surgical History:  Procedure Laterality Date  . ABDOMINAL HYSTERECTOMY  04/1980  . APPENDECTOMY  1960  . CARDIAC CATHETERIZATION  2003  . CATARACT EXTRACTION W/ INTRAOCULAR LENS  IMPLANT, BILATERAL Bilateral ~ 2012  . CHOLECYSTECTOMY N/A 06/26/2018   Procedure: LAPAROSCOPIC CHOLECYSTECTOMY POSSIBLE INTRAOPERATIVE CHOLANGIOGRAM;  Surgeon: Coralie Keens, MD;  Location: [facility];  Service: General;  Laterality: N/A;  . JOINT REPLACEMENT    . REPLACEMENT TOTAL KNEE Right 09/2005  . RIGHT/LEFT HEART CATH AND CORONARY ANGIOGRAPHY N/A 06/01/2018   Procedure: RIGHT/LEFT HEART CATH AND CORONARY ANGIOGRAPHY;  Surgeon: Jettie Booze, MD;  Location: [facility];  Service: Cardiovascular;  Laterality: N/A;  . SHOULDER OPEN ROTATOR CUFF REPAIR Right 07/1999  . TONSILLECTOMY  08/1974  . TOTAL KNEE ARTHROPLASTY Left 08/07/2013   Procedure: TOTAL KNEE ARTHROPLASTY- LEFT;  Surgeon: Vickey Huger, MD;  Location: [facility];  Service: Orthopedics;  Laterality: Left;  . TRANSTHORACIC ECHOCARDIOGRAM  2003   EF 55-65%; mild concentric LVH     OB History   No obstetric history on file.      Home Medications    Prior to Admission medications   Medication Sig Start Date End Date Taking? Authorizing Provider  acetaminophen (TYLENOL) 500 MG tablet Take 1,000 mg by mouth every 12 (twelve) hours.     [provider]  albuterol (PROVENTIL HFA;VENTOLIN HFA) 108 (90 Base) MCG/ACT inhaler Inhale 1-2 puffs into the lungs every 6 (six) hours as needed for wheezing or shortness of breath. 12/20/18   Desiree Hane, MD  allopurinol (ZYLOPRIM) 100 MG tablet Take 1  tablet (100 mg total) by mouth at bedtime. 12/20/18   Desiree Hane, MD  benzonatate (TESSALON) 100 MG capsule Take 1 capsule (100 mg total) by mouth 2 (two) times daily as needed for cough. 08/29/19   Luetta Nutting, DO  Blood Glucose Monitoring Suppl (ACCU-CHEK AVIVA PLUS) w/Device KIT Use to check blood sugars 1-2 times daily. 09/06/19   Cirigliano, Garvin Fila, DO  carvedilol (COREG) 25 MG tablet Take 1 tablet (25 mg total) by mouth 2 (two) times daily with a meal. 12/20/18   Oretha Milch D, MD  D3-1000 25 MCG (1000 UT) capsule Take 1 capsule (1,000 Units total) by mouth daily. Patient taking differently: Take 1,000 Units by mouth at bedtime.  12/20/18   Desiree Hane, MD  docusate sodium (COLACE) 100 MG capsule Take 1 capsule (100 mg total) by mouth daily as needed for mild constipation. 12/20/18   Desiree Hane, MD  ENSURE (ENSURE) Take 237 mLs by mouth every evening. 12/20/18   Oretha Milch D, MD  ferrous sulfate 325 (65 FE) MG tablet Take 325 mg by mouth every other day.    [provider]  furosemide (LASIX) 40 MG tablet Take 40 mg by mouth 2 (two) times daily.    [provider]  gabapentin (NEURONTIN) 100 MG capsule Take 1 capsule (100 mg total) by mouth 3 (three) times daily. 04/03/19   Hosie Poisson, MD  glucose blood (ACCU-CHEK AVIVA PLUS) test strip Use to test blood sugars 1-2 times daily. 09/06/19   Cirigliano, Garvin Fila, DO  hydrALAZINE (APRESOLINE) 50 MG tablet Take 1 tablet (50 mg total) by mouth 3 (three) times daily. 06/05/19 10/03/19  Erlene Quan, PA-C  hydroxychloroquine (PLAQUENIL) 200 MG tablet Take 1 tablet (200 mg total) by mouth 2 (two) times daily. 12/20/18   Desiree Hane, MD  hydroxypropyl methylcellulose / hypromellose (ISOPTO TEARS / GONIOVISC) 2.5 % ophthalmic solution Place 1 drop into both eyes daily. 12/20/18   Desiree Hane, MD  isosorbide mononitrate (IMDUR) 30 MG 24 hr tablet Take 1 tablet (30 mg total) by mouth at bedtime. 12/20/18    Desiree Hane, MD  Lancets (ACCU-CHEK SOFT TOUCH) lancets Use to check blood sugars 1-2 times daily. 09/06/19   Cirigliano, Garvin Fila, DO  leflunomide (ARAVA) 20 MG tablet Take 1 tablet (20 mg total) by mouth daily. 12/20/18   Oretha Milch D, MD  Lidocaine (ASPERCREME LIDOCAINE) 4 % PTCH Apply 1 Package topically daily.     [provider]  LORazepam (ATIVAN) 0.5 MG tablet Take 0.25 mg by mouth as needed for anxiety.     [provider]  Melatonin 3 MG TABS Take 6 mg by mouth at bedtime.     [provider]  Olopatadine HCl 0.2 % SOLN Place 1 drop into both eyes daily. 12/20/18   Oretha Milch D, MD  potassium chloride SA (K-DUR) 20 MEQ tablet Take 20 mEq by mouth See admin instructions. Monday and Thursday    [provider]  predniSONE (DELTASONE) 10 MG tablet Take 10 mg by mouth daily with breakfast.    [provider]  rivaroxaban (XARELTO) 20 MG TABS tablet Take 1 tablet (20 mg total) by mouth daily with supper. 12/20/18   Desiree Hane, MD  saccharomyces boulardii (FLORASTOR) 250 MG capsule Take 250 mg by mouth every other day.    [provider]  sacubitril-valsartan (ENTRESTO) 49-51 MG Take 1 tablet by mouth daily at 12 noon. 12/20/18   Desiree Hane, MD  sitaGLIPtin (JANUVIA) 25 MG tablet Take 12.5 mg by mouth daily.     [provider]  traMADol (ULTRAM) 50 MG tablet Take 100 mg by mouth 3 (three) times daily as needed.    [provider]    Family History Family History  Problem Relation Age of Onset  . Heart attack Father     Social History Social History   Tobacco Use  . Smoking status: Never Smoker  . Smokeless tobacco: Never Used  Substance Use Topics  . Alcohol use: No    Alcohol/week: 0.0 standard drinks  . Drug use: No     Allergies   Clonidine derivatives, Fish allergy, Shellfish allergy, Doxycycline, Indomethacin, Lyrica [pregabalin], Methyldopa, Morphine and related, Orange fruit  [citrus], Strawberry (diagnostic), Cetirizine hcl, Codeine, Levaquin [levofloxacin in d5w], and Tomato   Review of Systems Review of Systems  All systems reviewed and negative, other than as noted in HPI.  Physical Exam Updated Vital Signs BP (!) 161/65 (BP Location: Right Arm)   Pulse 79   Temp 98.4 F (36.9 C) (Oral)   Resp 18  SpO2 99%   Physical Exam Vitals signs and nursing note reviewed.  Constitutional:      General: She is not in acute distress.    Appearance: She is well-developed.  HENT:     Head: Normocephalic and atraumatic.  Eyes:     General:        Right eye: No discharge.        Left eye: No discharge.     Conjunctiva/sclera: Conjunctivae normal.  Neck:     Musculoskeletal: Neck supple.  Cardiovascular:     Rate and Rhythm: Normal rate and regular rhythm.     Heart sounds: Normal heart sounds. No murmur. No friction rub. No gallop.   Pulmonary:     Effort: Pulmonary effort is normal. No respiratory distress.     Breath sounds: Normal breath sounds.  Abdominal:     General: There is no distension.     Palpations: Abdomen is soft.     Tenderness: There is no abdominal tenderness.  Musculoskeletal:        General: No tenderness.  Skin:    General: Skin is warm and dry.  Neurological:     Mental Status: She is alert.  Psychiatric:        Behavior: Behavior normal.        Thought Content: Thought content normal.      ED Treatments / Results  Labs (all labs ordered are listed, but only abnormal results are displayed) Labs Reviewed  CBC WITH DIFFERENTIAL/PLATELET - Abnormal; Notable for the following components:      Result Value   RBC 2.56 (*)    Hemoglobin 8.3 (*)    HCT 25.8 (*)    MCV 100.8 (*)    RDW 17.4 (*)    All other components within normal limits  BASIC METABOLIC PANEL - Abnormal; Notable for the following components:   Potassium 3.2 (*)    BUN 26 (*)    Calcium 8.7 (*)    GFR calc non Af Amer 51 (*)    GFR calc Af Amer 59  (*)    All other components within normal limits  BRAIN NATRIURETIC PEPTIDE - Abnormal; Notable for the following components:   B Natriuretic Peptide 921.6 (*)    All other components within normal limits  SARS CORONAVIRUS 2 (TAT 6-24 HRS)    EKG None  Radiology Dg Chest Portable 1 View  Result Date: 09/13/2019 CLINICAL DATA:  Cough and dyspnea EXAM: PORTABLE CHEST 1 VIEW COMPARISON:  04/05/2019 FINDINGS: Chronic cardiomegaly and aortic tortuosity. There is no edema, consolidation, effusion, or pneumothorax. Cholecystectomy clips. Severe glenohumeral osteoarthritis on both sides. IMPRESSION: 1. No evidence of active disease. 2. Cardiomegaly. Electronically Signed   By: Monte Fantasia [name]   On: 09/13/2019 10:37    Procedures Procedures (including critical care time)  Medications Ordered in ED Medications - No data to display   Initial Impression / Assessment and Plan / ED Course  I have reviewed the triage vital signs and the nursing notes.  Pertinent labs & imaging results that were available during my care of the patient were reviewed by me and considered in my medical decision making (see chart for details).        85 year old female with persistent cough.  May potentially be bronchitis.  Imaging without acute abnormality.  Does not appear to be volume overloaded.  On Xarelto PE.  PE unlikely.  Denies any dyspnea.  She is not on ACE inhibitor.  Plan continue symptomatic  treatment.  Final Clinical Impressions(s) / ED Diagnoses   Final diagnoses:  Cough    ED Discharge Orders    None       Virgel Manifold, MD 09/16/19 [phone]

## 2019-09-13 NOTE — ED Notes (Signed)
pts daughter at bedside, d/c instructions given to pt and daughter, pts daughter states pt is allergic to codeine.  Informed Dr. Wilson Singer, advised to let pt know that she does not need to take if doesn't want to as pt is on Tessalon cough med.

## 2019-09-13 NOTE — ED Triage Notes (Signed)
Report from [facility]> pt from home. Discharged from rehab 1 week ago.  Bed bound.  C/o intermittent dry cough x 2 weeks.  Denies fever, chills.  Pt reports coughing bad at times and causes bowel incontinence.  Denies pain.

## 2019-09-13 NOTE — Telephone Encounter (Signed)
Ok to give verbal order for CXR

## 2019-09-14 ENCOUNTER — Other Ambulatory Visit: Payer: Self-pay | Admitting: *Deleted

## 2019-09-14 DIAGNOSIS — E1151 Type 2 diabetes mellitus with diabetic peripheral angiopathy without gangrene: Secondary | ICD-10-CM | POA: Diagnosis not present

## 2019-09-14 DIAGNOSIS — H538 Other visual disturbances: Secondary | ICD-10-CM | POA: Diagnosis not present

## 2019-09-14 DIAGNOSIS — K5790 Diverticulosis of intestine, part unspecified, without perforation or abscess without bleeding: Secondary | ICD-10-CM | POA: Diagnosis not present

## 2019-09-14 DIAGNOSIS — I5042 Chronic combined systolic (congestive) and diastolic (congestive) heart failure: Secondary | ICD-10-CM | POA: Diagnosis not present

## 2019-09-14 DIAGNOSIS — I4892 Unspecified atrial flutter: Secondary | ICD-10-CM | POA: Diagnosis not present

## 2019-09-14 DIAGNOSIS — R1312 Dysphagia, oropharyngeal phase: Secondary | ICD-10-CM | POA: Diagnosis not present

## 2019-09-14 DIAGNOSIS — M1990 Unspecified osteoarthritis, unspecified site: Secondary | ICD-10-CM | POA: Diagnosis not present

## 2019-09-14 DIAGNOSIS — M0589 Other rheumatoid arthritis with rheumatoid factor of multiple sites: Secondary | ICD-10-CM | POA: Diagnosis not present

## 2019-09-14 DIAGNOSIS — M48061 Spinal stenosis, lumbar region without neurogenic claudication: Secondary | ICD-10-CM | POA: Diagnosis not present

## 2019-09-14 DIAGNOSIS — I130 Hypertensive heart and chronic kidney disease with heart failure and stage 1 through stage 4 chronic kidney disease, or unspecified chronic kidney disease: Secondary | ICD-10-CM | POA: Diagnosis not present

## 2019-09-14 DIAGNOSIS — D631 Anemia in chronic kidney disease: Secondary | ICD-10-CM | POA: Diagnosis not present

## 2019-09-14 DIAGNOSIS — G931 Anoxic brain damage, not elsewhere classified: Secondary | ICD-10-CM | POA: Diagnosis not present

## 2019-09-14 DIAGNOSIS — I2510 Atherosclerotic heart disease of native coronary artery without angina pectoris: Secondary | ICD-10-CM | POA: Diagnosis not present

## 2019-09-14 DIAGNOSIS — N183 Chronic kidney disease, stage 3 unspecified: Secondary | ICD-10-CM | POA: Diagnosis not present

## 2019-09-14 DIAGNOSIS — E1122 Type 2 diabetes mellitus with diabetic chronic kidney disease: Secondary | ICD-10-CM | POA: Diagnosis not present

## 2019-09-14 DIAGNOSIS — I69398 Other sequelae of cerebral infarction: Secondary | ICD-10-CM | POA: Diagnosis not present

## 2019-09-14 DIAGNOSIS — Z86718 Personal history of other venous thrombosis and embolism: Secondary | ICD-10-CM | POA: Diagnosis not present

## 2019-09-14 DIAGNOSIS — M797 Fibromyalgia: Secondary | ICD-10-CM | POA: Diagnosis not present

## 2019-09-14 DIAGNOSIS — G8929 Other chronic pain: Secondary | ICD-10-CM | POA: Diagnosis not present

## 2019-09-14 DIAGNOSIS — I420 Dilated cardiomyopathy: Secondary | ICD-10-CM | POA: Diagnosis not present

## 2019-09-14 DIAGNOSIS — M1A9XX1 Chronic gout, unspecified, with tophus (tophi): Secondary | ICD-10-CM | POA: Diagnosis not present

## 2019-09-14 DIAGNOSIS — M7501 Adhesive capsulitis of right shoulder: Secondary | ICD-10-CM | POA: Diagnosis not present

## 2019-09-14 NOTE — Patient Outreach (Signed)
Telephone outreach #2 unsuccessful. I was able to leave a message and will call back next week on Wednesday.  [name]. [name], MSN, [facility] Gerontological Nurse Practitioner [facility] Care Management [phone]

## 2019-09-15 DIAGNOSIS — I2510 Atherosclerotic heart disease of native coronary artery without angina pectoris: Secondary | ICD-10-CM | POA: Diagnosis not present

## 2019-09-15 DIAGNOSIS — M1990 Unspecified osteoarthritis, unspecified site: Secondary | ICD-10-CM | POA: Diagnosis not present

## 2019-09-15 DIAGNOSIS — K5790 Diverticulosis of intestine, part unspecified, without perforation or abscess without bleeding: Secondary | ICD-10-CM | POA: Diagnosis not present

## 2019-09-15 DIAGNOSIS — E1151 Type 2 diabetes mellitus with diabetic peripheral angiopathy without gangrene: Secondary | ICD-10-CM | POA: Diagnosis not present

## 2019-09-15 DIAGNOSIS — I130 Hypertensive heart and chronic kidney disease with heart failure and stage 1 through stage 4 chronic kidney disease, or unspecified chronic kidney disease: Secondary | ICD-10-CM | POA: Diagnosis not present

## 2019-09-15 DIAGNOSIS — N183 Chronic kidney disease, stage 3 unspecified: Secondary | ICD-10-CM | POA: Diagnosis not present

## 2019-09-15 DIAGNOSIS — D631 Anemia in chronic kidney disease: Secondary | ICD-10-CM | POA: Diagnosis not present

## 2019-09-15 DIAGNOSIS — I5042 Chronic combined systolic (congestive) and diastolic (congestive) heart failure: Secondary | ICD-10-CM | POA: Diagnosis not present

## 2019-09-15 DIAGNOSIS — H538 Other visual disturbances: Secondary | ICD-10-CM | POA: Diagnosis not present

## 2019-09-15 DIAGNOSIS — M48061 Spinal stenosis, lumbar region without neurogenic claudication: Secondary | ICD-10-CM | POA: Diagnosis not present

## 2019-09-15 DIAGNOSIS — I69398 Other sequelae of cerebral infarction: Secondary | ICD-10-CM | POA: Diagnosis not present

## 2019-09-15 DIAGNOSIS — E1122 Type 2 diabetes mellitus with diabetic chronic kidney disease: Secondary | ICD-10-CM | POA: Diagnosis not present

## 2019-09-15 DIAGNOSIS — R0989 Other specified symptoms and signs involving the circulatory and respiratory systems: Secondary | ICD-10-CM | POA: Diagnosis not present

## 2019-09-15 DIAGNOSIS — Z86718 Personal history of other venous thrombosis and embolism: Secondary | ICD-10-CM | POA: Diagnosis not present

## 2019-09-15 DIAGNOSIS — M797 Fibromyalgia: Secondary | ICD-10-CM | POA: Diagnosis not present

## 2019-09-15 DIAGNOSIS — G931 Anoxic brain damage, not elsewhere classified: Secondary | ICD-10-CM | POA: Diagnosis not present

## 2019-09-15 DIAGNOSIS — M1A9XX1 Chronic gout, unspecified, with tophus (tophi): Secondary | ICD-10-CM | POA: Diagnosis not present

## 2019-09-15 DIAGNOSIS — R05 Cough: Secondary | ICD-10-CM | POA: Diagnosis not present

## 2019-09-15 DIAGNOSIS — R1312 Dysphagia, oropharyngeal phase: Secondary | ICD-10-CM | POA: Diagnosis not present

## 2019-09-15 DIAGNOSIS — M0589 Other rheumatoid arthritis with rheumatoid factor of multiple sites: Secondary | ICD-10-CM | POA: Diagnosis not present

## 2019-09-15 DIAGNOSIS — G8929 Other chronic pain: Secondary | ICD-10-CM | POA: Diagnosis not present

## 2019-09-15 DIAGNOSIS — I4892 Unspecified atrial flutter: Secondary | ICD-10-CM | POA: Diagnosis not present

## 2019-09-15 DIAGNOSIS — M7501 Adhesive capsulitis of right shoulder: Secondary | ICD-10-CM | POA: Diagnosis not present

## 2019-09-15 DIAGNOSIS — I420 Dilated cardiomyopathy: Secondary | ICD-10-CM | POA: Diagnosis not present

## 2019-09-19 ENCOUNTER — Other Ambulatory Visit: Payer: Self-pay | Admitting: Family Medicine

## 2019-09-19 ENCOUNTER — Ambulatory Visit: Payer: Medicare Other | Admitting: Cardiovascular Disease

## 2019-09-19 DIAGNOSIS — K5790 Diverticulosis of intestine, part unspecified, without perforation or abscess without bleeding: Secondary | ICD-10-CM | POA: Diagnosis not present

## 2019-09-19 DIAGNOSIS — M1A9XX1 Chronic gout, unspecified, with tophus (tophi): Secondary | ICD-10-CM | POA: Diagnosis not present

## 2019-09-19 DIAGNOSIS — N183 Chronic kidney disease, stage 3 unspecified: Secondary | ICD-10-CM | POA: Diagnosis not present

## 2019-09-19 DIAGNOSIS — H538 Other visual disturbances: Secondary | ICD-10-CM | POA: Diagnosis not present

## 2019-09-19 DIAGNOSIS — M48061 Spinal stenosis, lumbar region without neurogenic claudication: Secondary | ICD-10-CM | POA: Diagnosis not present

## 2019-09-19 DIAGNOSIS — I4892 Unspecified atrial flutter: Secondary | ICD-10-CM | POA: Diagnosis not present

## 2019-09-19 DIAGNOSIS — G8929 Other chronic pain: Secondary | ICD-10-CM | POA: Diagnosis not present

## 2019-09-19 DIAGNOSIS — I5042 Chronic combined systolic (congestive) and diastolic (congestive) heart failure: Secondary | ICD-10-CM | POA: Diagnosis not present

## 2019-09-19 DIAGNOSIS — H1013 Acute atopic conjunctivitis, bilateral: Secondary | ICD-10-CM

## 2019-09-19 DIAGNOSIS — M0589 Other rheumatoid arthritis with rheumatoid factor of multiple sites: Secondary | ICD-10-CM | POA: Diagnosis not present

## 2019-09-19 DIAGNOSIS — R1312 Dysphagia, oropharyngeal phase: Secondary | ICD-10-CM | POA: Diagnosis not present

## 2019-09-19 DIAGNOSIS — G931 Anoxic brain damage, not elsewhere classified: Secondary | ICD-10-CM | POA: Diagnosis not present

## 2019-09-19 DIAGNOSIS — I2510 Atherosclerotic heart disease of native coronary artery without angina pectoris: Secondary | ICD-10-CM | POA: Diagnosis not present

## 2019-09-19 DIAGNOSIS — I130 Hypertensive heart and chronic kidney disease with heart failure and stage 1 through stage 4 chronic kidney disease, or unspecified chronic kidney disease: Secondary | ICD-10-CM | POA: Diagnosis not present

## 2019-09-19 DIAGNOSIS — I69398 Other sequelae of cerebral infarction: Secondary | ICD-10-CM | POA: Diagnosis not present

## 2019-09-19 DIAGNOSIS — E1122 Type 2 diabetes mellitus with diabetic chronic kidney disease: Secondary | ICD-10-CM | POA: Diagnosis not present

## 2019-09-19 DIAGNOSIS — D631 Anemia in chronic kidney disease: Secondary | ICD-10-CM | POA: Diagnosis not present

## 2019-09-19 DIAGNOSIS — I420 Dilated cardiomyopathy: Secondary | ICD-10-CM | POA: Diagnosis not present

## 2019-09-19 DIAGNOSIS — E1151 Type 2 diabetes mellitus with diabetic peripheral angiopathy without gangrene: Secondary | ICD-10-CM | POA: Diagnosis not present

## 2019-09-19 DIAGNOSIS — M1990 Unspecified osteoarthritis, unspecified site: Secondary | ICD-10-CM | POA: Diagnosis not present

## 2019-09-19 DIAGNOSIS — Z86718 Personal history of other venous thrombosis and embolism: Secondary | ICD-10-CM | POA: Diagnosis not present

## 2019-09-19 DIAGNOSIS — M797 Fibromyalgia: Secondary | ICD-10-CM | POA: Diagnosis not present

## 2019-09-19 DIAGNOSIS — M7501 Adhesive capsulitis of right shoulder: Secondary | ICD-10-CM | POA: Diagnosis not present

## 2019-09-20 ENCOUNTER — Other Ambulatory Visit: Payer: Self-pay | Admitting: *Deleted

## 2019-09-20 DIAGNOSIS — H538 Other visual disturbances: Secondary | ICD-10-CM | POA: Diagnosis not present

## 2019-09-20 DIAGNOSIS — M0589 Other rheumatoid arthritis with rheumatoid factor of multiple sites: Secondary | ICD-10-CM | POA: Diagnosis not present

## 2019-09-20 DIAGNOSIS — I69398 Other sequelae of cerebral infarction: Secondary | ICD-10-CM | POA: Diagnosis not present

## 2019-09-20 DIAGNOSIS — M1990 Unspecified osteoarthritis, unspecified site: Secondary | ICD-10-CM | POA: Diagnosis not present

## 2019-09-20 DIAGNOSIS — M7501 Adhesive capsulitis of right shoulder: Secondary | ICD-10-CM | POA: Diagnosis not present

## 2019-09-20 DIAGNOSIS — N183 Chronic kidney disease, stage 3 unspecified: Secondary | ICD-10-CM | POA: Diagnosis not present

## 2019-09-20 DIAGNOSIS — E1151 Type 2 diabetes mellitus with diabetic peripheral angiopathy without gangrene: Secondary | ICD-10-CM | POA: Diagnosis not present

## 2019-09-20 DIAGNOSIS — I4892 Unspecified atrial flutter: Secondary | ICD-10-CM | POA: Diagnosis not present

## 2019-09-20 DIAGNOSIS — G931 Anoxic brain damage, not elsewhere classified: Secondary | ICD-10-CM | POA: Diagnosis not present

## 2019-09-20 DIAGNOSIS — R1312 Dysphagia, oropharyngeal phase: Secondary | ICD-10-CM | POA: Diagnosis not present

## 2019-09-20 DIAGNOSIS — I420 Dilated cardiomyopathy: Secondary | ICD-10-CM | POA: Diagnosis not present

## 2019-09-20 DIAGNOSIS — E1122 Type 2 diabetes mellitus with diabetic chronic kidney disease: Secondary | ICD-10-CM | POA: Diagnosis not present

## 2019-09-20 DIAGNOSIS — I2510 Atherosclerotic heart disease of native coronary artery without angina pectoris: Secondary | ICD-10-CM | POA: Diagnosis not present

## 2019-09-20 DIAGNOSIS — M48061 Spinal stenosis, lumbar region without neurogenic claudication: Secondary | ICD-10-CM | POA: Diagnosis not present

## 2019-09-20 DIAGNOSIS — M1A9XX1 Chronic gout, unspecified, with tophus (tophi): Secondary | ICD-10-CM | POA: Diagnosis not present

## 2019-09-20 DIAGNOSIS — D631 Anemia in chronic kidney disease: Secondary | ICD-10-CM | POA: Diagnosis not present

## 2019-09-20 DIAGNOSIS — I5042 Chronic combined systolic (congestive) and diastolic (congestive) heart failure: Secondary | ICD-10-CM | POA: Diagnosis not present

## 2019-09-20 DIAGNOSIS — G8929 Other chronic pain: Secondary | ICD-10-CM | POA: Diagnosis not present

## 2019-09-20 DIAGNOSIS — M797 Fibromyalgia: Secondary | ICD-10-CM | POA: Diagnosis not present

## 2019-09-20 DIAGNOSIS — Z86718 Personal history of other venous thrombosis and embolism: Secondary | ICD-10-CM | POA: Diagnosis not present

## 2019-09-20 DIAGNOSIS — I130 Hypertensive heart and chronic kidney disease with heart failure and stage 1 through stage 4 chronic kidney disease, or unspecified chronic kidney disease: Secondary | ICD-10-CM | POA: Diagnosis not present

## 2019-09-20 DIAGNOSIS — K5790 Diverticulosis of intestine, part unspecified, without perforation or abscess without bleeding: Secondary | ICD-10-CM | POA: Diagnosis not present

## 2019-09-20 NOTE — Telephone Encounter (Signed)
Unsure if you're following all of these for her?

## 2019-09-20 NOTE — Patient Outreach (Signed)
Telephone outreach. Spoke briefly with Mrs. [name] [name], pt's sister and primary caregiver.   Initially I asked how she is doing as the last time I talked with her she was feeling very frustrated with providing care and also taking care of herself.  She reports she is much better and expressed appreciation for the support. She said she could not talk now but would call me back.  Have been attempting to get Mrs. [name] [name] case open since the beginning of the month. I will close her case if I cannot move forward on providing meaningful care management services.  [name]. [name], MSN, [facility] Gerontological Nurse Practitioner [facility] Care Management [phone]

## 2019-09-21 ENCOUNTER — Encounter: Payer: Self-pay | Admitting: *Deleted

## 2019-09-21 NOTE — Telephone Encounter (Signed)
I refilled med with the exception of rheumatologic meds and eye gtts. Pt needs f/u with rheum or needs to contact rheum office, same for eye gtts. I refilled cardiac meds but please ensure pt has cardio f/u in place

## 2019-09-22 DIAGNOSIS — E1151 Type 2 diabetes mellitus with diabetic peripheral angiopathy without gangrene: Secondary | ICD-10-CM | POA: Diagnosis not present

## 2019-09-22 DIAGNOSIS — Z86718 Personal history of other venous thrombosis and embolism: Secondary | ICD-10-CM | POA: Diagnosis not present

## 2019-09-22 DIAGNOSIS — M1990 Unspecified osteoarthritis, unspecified site: Secondary | ICD-10-CM | POA: Diagnosis not present

## 2019-09-22 DIAGNOSIS — M797 Fibromyalgia: Secondary | ICD-10-CM | POA: Diagnosis not present

## 2019-09-22 DIAGNOSIS — M48061 Spinal stenosis, lumbar region without neurogenic claudication: Secondary | ICD-10-CM | POA: Diagnosis not present

## 2019-09-22 DIAGNOSIS — I4892 Unspecified atrial flutter: Secondary | ICD-10-CM | POA: Diagnosis not present

## 2019-09-22 DIAGNOSIS — N183 Chronic kidney disease, stage 3 unspecified: Secondary | ICD-10-CM | POA: Diagnosis not present

## 2019-09-22 DIAGNOSIS — H538 Other visual disturbances: Secondary | ICD-10-CM | POA: Diagnosis not present

## 2019-09-22 DIAGNOSIS — G931 Anoxic brain damage, not elsewhere classified: Secondary | ICD-10-CM | POA: Diagnosis not present

## 2019-09-22 DIAGNOSIS — M7501 Adhesive capsulitis of right shoulder: Secondary | ICD-10-CM | POA: Diagnosis not present

## 2019-09-22 DIAGNOSIS — R1312 Dysphagia, oropharyngeal phase: Secondary | ICD-10-CM | POA: Diagnosis not present

## 2019-09-22 DIAGNOSIS — E1122 Type 2 diabetes mellitus with diabetic chronic kidney disease: Secondary | ICD-10-CM | POA: Diagnosis not present

## 2019-09-22 DIAGNOSIS — M1A9XX1 Chronic gout, unspecified, with tophus (tophi): Secondary | ICD-10-CM | POA: Diagnosis not present

## 2019-09-22 DIAGNOSIS — I420 Dilated cardiomyopathy: Secondary | ICD-10-CM | POA: Diagnosis not present

## 2019-09-22 DIAGNOSIS — I5042 Chronic combined systolic (congestive) and diastolic (congestive) heart failure: Secondary | ICD-10-CM | POA: Diagnosis not present

## 2019-09-22 DIAGNOSIS — G8929 Other chronic pain: Secondary | ICD-10-CM | POA: Diagnosis not present

## 2019-09-22 DIAGNOSIS — M0589 Other rheumatoid arthritis with rheumatoid factor of multiple sites: Secondary | ICD-10-CM | POA: Diagnosis not present

## 2019-09-22 DIAGNOSIS — I69398 Other sequelae of cerebral infarction: Secondary | ICD-10-CM | POA: Diagnosis not present

## 2019-09-22 DIAGNOSIS — I130 Hypertensive heart and chronic kidney disease with heart failure and stage 1 through stage 4 chronic kidney disease, or unspecified chronic kidney disease: Secondary | ICD-10-CM | POA: Diagnosis not present

## 2019-09-22 DIAGNOSIS — K5790 Diverticulosis of intestine, part unspecified, without perforation or abscess without bleeding: Secondary | ICD-10-CM | POA: Diagnosis not present

## 2019-09-22 DIAGNOSIS — I2510 Atherosclerotic heart disease of native coronary artery without angina pectoris: Secondary | ICD-10-CM | POA: Diagnosis not present

## 2019-09-22 DIAGNOSIS — D631 Anemia in chronic kidney disease: Secondary | ICD-10-CM | POA: Diagnosis not present

## 2019-09-24 ENCOUNTER — Other Ambulatory Visit: Payer: Self-pay

## 2019-09-24 ENCOUNTER — Emergency Department (HOSPITAL_COMMUNITY)
Admission: EM | Admit: 2019-09-24 | Discharge: 2019-09-25 | Disposition: A | Discharge status: Home / Self Care | Payer: Medicare Other | Attending: Emergency Medicine | Admitting: Emergency Medicine

## 2019-09-24 ENCOUNTER — Encounter (HOSPITAL_COMMUNITY): Payer: Self-pay | Admitting: Emergency Medicine

## 2019-09-24 ENCOUNTER — Emergency Department (HOSPITAL_COMMUNITY): Payer: Medicare Other

## 2019-09-24 DIAGNOSIS — R531 Weakness: Secondary | ICD-10-CM

## 2019-09-24 DIAGNOSIS — R609 Edema, unspecified: Secondary | ICD-10-CM | POA: Diagnosis not present

## 2019-09-24 DIAGNOSIS — N183 Chronic kidney disease, stage 3 unspecified: Secondary | ICD-10-CM | POA: Diagnosis not present

## 2019-09-24 DIAGNOSIS — Z79899 Other long term (current) drug therapy: Secondary | ICD-10-CM | POA: Diagnosis not present

## 2019-09-24 DIAGNOSIS — E1122 Type 2 diabetes mellitus with diabetic chronic kidney disease: Secondary | ICD-10-CM | POA: Diagnosis not present

## 2019-09-24 DIAGNOSIS — Z7901 Long term (current) use of anticoagulants: Secondary | ICD-10-CM | POA: Insufficient documentation

## 2019-09-24 DIAGNOSIS — I130 Hypertensive heart and chronic kidney disease with heart failure and stage 1 through stage 4 chronic kidney disease, or unspecified chronic kidney disease: Secondary | ICD-10-CM | POA: Insufficient documentation

## 2019-09-24 DIAGNOSIS — R000 Tachycardia, unspecified: Secondary | ICD-10-CM | POA: Diagnosis not present

## 2019-09-24 DIAGNOSIS — T68XXXA Hypothermia, initial encounter: Secondary | ICD-10-CM | POA: Diagnosis not present

## 2019-09-24 DIAGNOSIS — G459 Transient cerebral ischemic attack, unspecified: Secondary | ICD-10-CM | POA: Diagnosis not present

## 2019-09-24 DIAGNOSIS — I2510 Atherosclerotic heart disease of native coronary artery without angina pectoris: Secondary | ICD-10-CM | POA: Diagnosis not present

## 2019-09-24 DIAGNOSIS — N390 Urinary tract infection, site not specified: Secondary | ICD-10-CM | POA: Diagnosis not present

## 2019-09-24 DIAGNOSIS — Z86711 Personal history of pulmonary embolism: Secondary | ICD-10-CM | POA: Insufficient documentation

## 2019-09-24 DIAGNOSIS — Z743 Need for continuous supervision: Secondary | ICD-10-CM | POA: Diagnosis not present

## 2019-09-24 DIAGNOSIS — I5042 Chronic combined systolic (congestive) and diastolic (congestive) heart failure: Secondary | ICD-10-CM | POA: Insufficient documentation

## 2019-09-24 DIAGNOSIS — R5383 Other fatigue: Secondary | ICD-10-CM | POA: Diagnosis present

## 2019-09-24 LAB — COMPREHENSIVE METABOLIC PANEL
ALT: 10 U/L (ref 0–44)
AST: 14 U/L — ABNORMAL LOW (ref 15–41)
Albumin: 3.4 g/dL — ABNORMAL LOW (ref 3.5–5.0)
Alkaline Phosphatase: 50 U/L (ref 38–126)
Anion gap: 10 (ref 5–15)
BUN: 28 mg/dL — ABNORMAL HIGH (ref 8–23)
CO2: 26 mmol/L (ref 22–32)
Calcium: 9.1 mg/dL (ref 8.9–10.3)
Chloride: 104 mmol/L (ref 98–111)
Creatinine, Ser: 1.16 mg/dL — ABNORMAL HIGH (ref 0.44–1.00)
GFR calc Af Amer: 49 mL/min — ABNORMAL LOW (ref >60)
GFR calc non Af Amer: 42 mL/min — ABNORMAL LOW (ref >60)
Glucose, Bld: 108 mg/dL — ABNORMAL HIGH (ref 70–99)
Potassium: 3.7 mmol/L (ref 3.5–5.1)
Sodium: 140 mmol/L (ref 135–145)
Total Bilirubin: 0.8 mg/dL (ref 0.3–1.2)
Total Protein: 6.3 g/dL — ABNORMAL LOW (ref 6.5–8.1)

## 2019-09-24 LAB — CBC
HCT: 28 % — ABNORMAL LOW (ref 36.0–46.0)
Hemoglobin: 8.9 g/dL — ABNORMAL LOW (ref 12.0–15.0)
MCH: 32.2 pg (ref 26.0–34.0)
MCHC: 31.8 g/dL (ref 30.0–36.0)
MCV: 101.4 fL — ABNORMAL HIGH (ref 80.0–100.0)
Platelets: 263 10*3/uL (ref 150–400)
RBC: 2.76 MIL/uL — ABNORMAL LOW (ref 3.87–5.11)
RDW: 17.5 % — ABNORMAL HIGH (ref 11.5–15.5)
WBC: 6.3 10*3/uL (ref 4.0–10.5)
nRBC: 0 % (ref 0.0–0.2)

## 2019-09-24 LAB — CBG MONITORING, ED: Glucose-Capillary: 107 mg/dL — ABNORMAL HIGH (ref 70–99)

## 2019-09-24 LAB — TROPONIN I (HIGH SENSITIVITY)
Troponin I (High Sensitivity): 10 ng/L (ref <18)
Troponin I (High Sensitivity): 9 ng/L (ref <18)

## 2019-09-24 MED ORDER — SODIUM CHLORIDE 0.9 % IV SOLN
INTRAVENOUS | Status: DC
  Administered 2019-09-24: 122 mL/h via INTRAVENOUS

## 2019-09-24 NOTE — ED Triage Notes (Signed)
Pr brought to ED by [facility] from home for c/o increase weakness during PT, getting more lethargic, a diaphoretic for a litter bit. After few min pt was AO x 4, no weakness noticed, HR 70, BP 120/70, SPO2 100% RA.

## 2019-09-24 NOTE — ED Provider Notes (Signed)
[facility] EMERGENCY DEPARTMENT Provider Note   CSN: [id number] Arrival date & time: 09/24/19  1954     History   Chief Complaint Chief Complaint  Patient presents with  . Fatigue    HPI [name] [name] is a 85 y.o. female.     HPI Patient presents after family reported her being listless, possibly well having physical therapy. The patient herself states that she feels good, has no complaints.  On she knowledges multiple med problems including prior stroke, with ongoing rehabilitation due to right lower extremity loss of function she denies recent illness including fever, vomiting, pain, dyspnea, cough. Reportedly the patient was at therapy today, was less interactive, possibly unresponsive, and was sent here for evaluation.  Past Medical History:  Diagnosis Date  . Allergic rhinitis due to pollen 04/27/2007  . Arthritis    "in q joint" (08/07/2013)  . CHF (congestive heart failure) ([place])   . Coronary atherosclerosis of native coronary artery   . Cough   . Edema 05/03/2013  . Exertional shortness of breath    "sometimes" (08/07/2013)  . First degree atrioventricular block   . Gout, unspecified 04/19/2013  . Hyperlipidemia 04/27/2007  . Hypertension   . Midline low back pain without sciatica 06/27/2014  . Muscle spasm   . Muscle weakness (generalized)   . Onychia and paronychia of toe   . Osteoarthrosis, unspecified whether generalized or localized, unspecified site 04/27/2007  . Other fall   . Other malaise and fatigue   . Pneumonia 05/2018  . Prepatellar bursitis   . Pulmonary embolism ([place]) 07/2018  . Rheumatic nodule ([place]) 06/28/2017  . Seizures ([place])   . Stroke ([place]) 01/17/2014  . Type II diabetes mellitus ([place])    "fasting 90-110s" (08/07/2013)  . Unspecified vitamin D deficiency     Patient Active Problem List   Diagnosis Date Noted  . Pre-operative clearance 05/29/2019  . Volume overload 05/29/2019  . Acute metabolic encephalopathy  32/95/1884  . Pressure injury of skin 01/12/2019  . AMS (altered mental status) 01/09/2019  . Rheumatoid arthritis flare ([place]) 12/23/2018  . Weakness of right lower extremity 12/23/2018  . Abscess of right upper extremity 12/14/2018  . Fever 12/13/2018  . Hand swelling 12/02/2018  . Pain, wrist 12/02/2018  . Hand pain 12/02/2018  . Polyarthritis 12/02/2018  . Spinal stenosis, lumbar 11/19/2018  . Acute encephalopathy 11/19/2018  . History of CVA (cerebrovascular accident) 11/19/2018  . Acute gout 11/15/2018  . Low back pain 11/14/2018  . Chronic back pain 10/27/2018  . Arthralgia of both knees 10/27/2018  . Rheumatoid arthritis involving both knees ([place]) 10/27/2018  . Acute pain 10/21/2018  . Physical deconditioning 09/06/2018  . Hyperlipidemia associated with type 2 diabetes mellitus ([place]), refuses statin 09/06/2018  . Refusal of statin medication by patient 09/04/2018  . Deep vein thrombosis (DVT) of distal vein of right lower extremity ([place]) 07/30/2018  . Hypertensive heart disease with heart failure ([place]) 07/30/2018  . Pulmonary embolism ([place]), 07/2018, on Xarelto with goal to DC after 6 months if more active 07/10/2018  . Elevated brain natriuretic peptide (BNP) level   . DCM (dilated cardiomyopathy) ([place])   . Benign hypertensive heart and kidney disease with CHF and stage 3 chronic kidney disease ([place]) 05/28/2018  . Osteoarthritis 05/06/2018  . Hypokalemia 02/02/2018  . Adhesive capsulitis of right shoulder 10/12/2017  . Idiopathic chronic gout of multiple sites with tophus 08/25/2017  . History of total knee arthroplasty, bilateral 02/18/2017  . History  of rotator cuff surgery 02/18/2017  . High risk medications, long-term use 09/21/2016  . Chronic combined systolic and diastolic CHF (congestive heart failure) ([place]) 09/12/2015  . Bilateral edema of lower extremity 06/05/2014  . CKD stage 3 due to type 2 diabetes mellitus ([place]) 06/05/2014  . DM (diabetes mellitus), type 2  with renal complications ([place]) 46/27/0350    Class: Chronic  . Anemia of chronic disease 05/01/2014  . CAD (coronary artery disease) 10/18/2013  . Hypertension associated with diabetes ([place]) 08/08/2013    Class: Chronic  . Fibromyalgia 05/10/2013  . Type 2 diabetes mellitus with hyperlipidemia ([place]), patient declines statin 04/27/2007  . Allergic rhinitis 04/27/2007  . Diverticulosis 04/27/2007    Past Surgical History:  Procedure Laterality Date  . ABDOMINAL HYSTERECTOMY  04/1980  . APPENDECTOMY  1960  . CARDIAC CATHETERIZATION  2003  . CATARACT EXTRACTION W/ INTRAOCULAR LENS  IMPLANT, BILATERAL Bilateral ~ 2012  . CHOLECYSTECTOMY N/A 06/26/2018   Procedure: LAPAROSCOPIC CHOLECYSTECTOMY POSSIBLE INTRAOPERATIVE CHOLANGIOGRAM;  Surgeon: Coralie Keens, MD;  Location: [facility];  Service: General;  Laterality: N/A;  . JOINT REPLACEMENT    . REPLACEMENT TOTAL KNEE Right 09/2005  . RIGHT/LEFT HEART CATH AND CORONARY ANGIOGRAPHY N/A 06/01/2018   Procedure: RIGHT/LEFT HEART CATH AND CORONARY ANGIOGRAPHY;  Surgeon: Jettie Booze, MD;  Location: [facility];  Service: Cardiovascular;  Laterality: N/A;  . SHOULDER OPEN ROTATOR CUFF REPAIR Right 07/1999  . TONSILLECTOMY  08/1974  . TOTAL KNEE ARTHROPLASTY Left 08/07/2013   Procedure: TOTAL KNEE ARTHROPLASTY- LEFT;  Surgeon: Vickey Huger, MD;  Location: [facility];  Service: Orthopedics;  Laterality: Left;  . TRANSTHORACIC ECHOCARDIOGRAM  2003   EF 55-65%; mild concentric LVH     OB History   No obstetric history on file.      Home Medications    Prior to Admission medications   Medication Sig Start Date End Date Taking? Authorizing Provider  acetaminophen (TYLENOL) 500 MG tablet Take 1,000 mg by mouth every 12 (twelve) hours.     [provider]  albuterol (PROVENTIL HFA;VENTOLIN HFA) 108 (90 Base) MCG/ACT inhaler Inhale 1-2 puffs into the lungs every 6 (six) hours as needed for wheezing or shortness of breath. 12/20/18    Desiree Hane, MD  allopurinol (ZYLOPRIM) 100 MG tablet TAKE 1 TABLET BY MOUTH EVERY DAY AT BEDTIME 09/21/19   Cirigliano, Mary K, DO  benzonatate (TESSALON) 100 MG capsule Take 1 capsule (100 mg total) by mouth 2 (two) times daily as needed for cough. 08/29/19   Luetta Nutting, DO  Blood Glucose Monitoring Suppl (ACCU-CHEK AVIVA PLUS) w/Device KIT Use to check blood sugars 1-2 times daily. 09/06/19   Cirigliano, Mary K, DO  carvedilol (COREG) 25 MG tablet TAKE 1 TABLET BY MOUTH TWICE DAILY 09/21/19   Cirigliano, Mary K, DO  D3-1000 25 MCG (1000 UT) capsule Take 1 capsule (1,000 Units total) by mouth daily. Patient taking differently: Take 1,000 Units by mouth at bedtime.  12/20/18   Desiree Hane, MD  docusate sodium (COLACE) 100 MG capsule Take 1 capsule (100 mg total) by mouth daily as needed for mild constipation. 12/20/18   Desiree Hane, MD  ENSURE (ENSURE) Take 237 mLs by mouth every evening. 12/20/18   Desiree Hane, MD  ENTRESTO 49-51 MG TAKE 1 TABLET BY MOUTH EVERY DAY 09/21/19   Cirigliano, Mary K, DO  ferrous sulfate 325 (65 FE) MG tablet Take 325 mg by mouth every other day.    [provider]  furosemide (LASIX) 40 MG tablet TAKE 1 TABLET BY MOUTH TWICE DAILY 09/21/19   Cirigliano, Mary K, DO  gabapentin (NEURONTIN) 100 MG capsule TAKE 1 CAPSULE BY MOUTH THREE TIMES DAILY 09/21/19   Cirigliano, Mary K, DO  glucose blood (ACCU-CHEK AVIVA PLUS) test strip Use to test blood sugars 1-2 times daily. 09/06/19   Cirigliano, Mary K, DO  guaiFENesin-codeine (ROBITUSSIN AC) 100-10 MG/5ML syrup Take 5 mLs by mouth 2 (two) times daily as needed for cough. 09/13/19   Virgel Manifold, MD  hydrALAZINE (APRESOLINE) 50 MG tablet TAKE 1 TABLET BY MOUTH THREE TIMES DAILY 09/21/19   Cirigliano, Garvin Fila, DO  hydroxychloroquine (PLAQUENIL) 200 MG tablet Take 1 tablet (200 mg total) by mouth 2 (two) times daily. 12/20/18   Desiree Hane, MD  hydroxypropyl methylcellulose / hypromellose  (ISOPTO TEARS / GONIOVISC) 2.5 % ophthalmic solution Place 1 drop into both eyes daily. 12/20/18   Oretha Milch D, MD  isosorbide mononitrate (IMDUR) 30 MG 24 hr tablet TAKE 1 TABLET BY MOUTH EVERY DAY 09/21/19   Cirigliano, Mary K, DO  JANUVIA 25 MG tablet TAKE 1/2 TABLET BY MOUTH(12,5 MG) EVERY DAY 09/21/19   Cirigliano, Garvin Fila, DO  Lancets (ACCU-CHEK SOFT TOUCH) lancets Use to check blood sugars 1-2 times daily. 09/06/19   Cirigliano, Garvin Fila, DO  leflunomide (ARAVA) 20 MG tablet Take 1 tablet (20 mg total) by mouth daily. 12/20/18   Oretha Milch D, MD  Lidocaine (ASPERCREME LIDOCAINE) 4 % PTCH Apply 1 Package topically daily.     [provider]  LORazepam (ATIVAN) 0.5 MG tablet Take 0.25 mg by mouth as needed for anxiety.     [provider]  Melatonin 3 MG TABS Take 6 mg by mouth at bedtime.     [provider]  Olopatadine HCl 0.2 % SOLN Place 1 drop into both eyes daily. 12/20/18   Oretha Milch D, MD  potassium chloride SA (K-DUR) 20 MEQ tablet Take 20 mEq by mouth See admin instructions. Monday and Thursday    [provider]  predniSONE (DELTASONE) 10 MG tablet Take 10 mg by mouth daily with breakfast.    [provider]  rivaroxaban (XARELTO) 20 MG TABS tablet Take 1 tablet (20 mg total) by mouth daily with supper. 12/20/18   Desiree Hane, MD  saccharomyces boulardii (FLORASTOR) 250 MG capsule Take 250 mg by mouth every other day.    [provider]  traMADol (ULTRAM) 50 MG tablet Take 100 mg by mouth 3 (three) times daily as needed.    [provider]    Family History Family History  Problem Relation Age of Onset  . Heart attack Father     Social History Social History   Tobacco Use  . Smoking status: Never Smoker  . Smokeless tobacco: Never Used  Substance Use Topics  . Alcohol use: No    Alcohol/week: 0.0 standard drinks  . Drug use: No     Allergies   Clonidine derivatives, Fish allergy, Shellfish  allergy, Doxycycline, Indomethacin, Lyrica [pregabalin], Methyldopa, Morphine and related, Orange fruit [citrus], Strawberry (diagnostic), Cetirizine hcl, Codeine, Levaquin [levofloxacin in d5w], and Tomato   Review of Systems Review of Systems  Constitutional:       Per HPI, otherwise negative  HENT:       Per HPI, otherwise negative  Respiratory:       Per HPI, otherwise negative  Cardiovascular:       Per HPI, otherwise  negative  Gastrointestinal: Negative for vomiting.  Endocrine:       Negative aside from HPI  Genitourinary:       Neg aside from HPI   Musculoskeletal:       Per HPI, otherwise negative  Skin: Negative.   Neurological: Positive for weakness and numbness. Negative for syncope.     Physical Exam Updated Vital Signs BP (!) 154/71   Pulse 69   Temp 97.9 F (36.6 C) (Rectal)   Resp 12   Ht _0  (1.626 m)   Wt 81.6 kg   SpO2 100%   BMI 30.90 kg/m   Physical Exam Vitals signs and nursing note reviewed.  Constitutional:      General: She is not in acute distress.    Appearance: She is well-developed.  HENT:     Head: Normocephalic and atraumatic.  Eyes:     Conjunctiva/sclera: Conjunctivae normal.  Cardiovascular:     Rate and Rhythm: Normal rate and regular rhythm.  Pulmonary:     Effort: Pulmonary effort is normal. No respiratory distress.     Breath sounds: Normal breath sounds. No stridor.  Abdominal:     General: There is no distension.  Skin:    General: Skin is warm and dry.  Neurological:     Mental Status: She is alert and oriented to person, place, and time.     Cranial Nerves: No cranial nerve deficit.     Comments: Right lower extremity demonstrates minimal capacity, minimal strength. Face is symmetric, speech is clear, and she does follow neurologic commands appropriately.  Psychiatric:        Mood and Affect: Mood normal.        Behavior: Behavior normal.        Thought Content: Thought content normal.      ED Treatments  / Results  Labs (all labs ordered are listed, but only abnormal results are displayed) Labs Reviewed  CBC - Abnormal; Notable for the following components:      Result Value   RBC 2.76 (*)    Hemoglobin 8.9 (*)    HCT 28.0 (*)    MCV 101.4 (*)    RDW 17.5 (*)    All other components within normal limits  COMPREHENSIVE METABOLIC PANEL - Abnormal; Notable for the following components:   Glucose, Bld 108 (*)    BUN 28 (*)    Creatinine, Ser 1.16 (*)    Total Protein 6.3 (*)    Albumin 3.4 (*)    AST 14 (*)    GFR calc non Af Amer 42 (*)    GFR calc Af Amer 49 (*)    All other components within normal limits  CBG MONITORING, ED - Abnormal; Notable for the following components:   Glucose-Capillary 107 (*)    All other components within normal limits  URINE CULTURE  URINALYSIS, ROUTINE W REFLEX MICROSCOPIC  TROPONIN I (HIGH SENSITIVITY)  TROPONIN I (HIGH SENSITIVITY)    EKG None  Radiology Dg Chest Port 1 View  Result Date: 09/24/2019 CLINICAL DATA:  Increased weakness EXAM: PORTABLE CHEST 1 VIEW COMPARISON:  09/13/2019 FINDINGS: Stable cardiomegaly. Negative for edema or focal airspace disease. Aortic atherosclerosis. No pneumothorax. Advanced shoulder arthritis with postsurgical change at the right humeral head and scapula. IMPRESSION: No active disease.  Cardiomegaly Electronically Signed   By: Donavan Foil [name]   On: 09/24/2019 20:47    Procedures Procedures (including critical care time)  Medications Ordered in ED Medications  0.9 %  sodium chloride infusion (122 mL/hr Intravenous New Bag/Given 09/24/19 2025)     Initial Impression / Assessment and Plan / ED Course  I have reviewed the triage vital signs and the nursing notes.  Pertinent labs & imaging results that were available during my care of the patient were reviewed by me and considered in my medical decision making (see chart for details).        11:01 PM Patient in no distress, resting  comfortably. She is now accompanied by her daughter. We discussed all findings including labs that are generally reassuring, with 2 normal troponin, nonischemic EKG, reassuring x-ray, low suspicion for ACS, pneumonia, bacteremia, sepsis. However, patient has yet to produce a urinalysis, and there is some suspicion for UTI contributing to her weakness. Patient also found to have slight elevated creatinine, and weakness may be secondary solely to dehydration. Patient has received fluid resuscitation while in the emergency department. All findings discussed at length with the patient and her daughter, who are both amenable to discharge, once UA is available.  Dr. Leonides Schanz is aware of the patient on sign-out  Final Clinical Impressions(s) / ED Diagnoses  weakness   Carmin Muskrat, MD 09/24/19 2303

## 2019-09-24 NOTE — ED Provider Notes (Signed)
11:28 PM  Assumed care from Dr. Vanita Panda.  Pt is an 85 yo F with fatigue.  Mild dehydration.  Rcvd IVF.  UA pending.  Plan to dc home.  Daughter at bedside agrees with plan.  1:20 AM  Pt's urine positive for moderate leukocytes, 6-10 white blood cells, many bacteria.  We will send urine culture and give IV ceftriaxone for possible early UTI.  Anticipate discharge on Keflex.  She does not have an allergy to cephalosporins.  Previous urine cultures have been negative.  Family comfortable with this plan.  Will call [facility] for discharge home.   At this time, I do not feel there is any life-threatening condition present. I have reviewed, interpreted and discussed all results (EKG, imaging, lab, urine as appropriate) and exam findings with patient/family. I have reviewed nursing notes and appropriate previous records.  I feel the patient is safe to be discharged home without further emergent workup and can continue workup as an outpatient as needed. Discussed usual and customary return precautions. Patient/family verbalize understanding and are comfortable with this plan.  Outpatient follow-up has been provided as needed. All questions have been answered.    Edelin Fryer, Delice Bison, DO 09/25/19 [phone]

## 2019-09-24 NOTE — ED Notes (Signed)
Purewick has been placed on pt.

## 2019-09-25 DIAGNOSIS — Z7401 Bed confinement status: Secondary | ICD-10-CM | POA: Diagnosis not present

## 2019-09-25 DIAGNOSIS — M2550 Pain in unspecified joint: Secondary | ICD-10-CM | POA: Diagnosis not present

## 2019-09-25 DIAGNOSIS — Z743 Need for continuous supervision: Secondary | ICD-10-CM | POA: Diagnosis not present

## 2019-09-25 DIAGNOSIS — R5381 Other malaise: Secondary | ICD-10-CM | POA: Diagnosis not present

## 2019-09-25 DIAGNOSIS — R531 Weakness: Secondary | ICD-10-CM | POA: Diagnosis not present

## 2019-09-25 LAB — URINALYSIS, ROUTINE W REFLEX MICROSCOPIC
Bilirubin Urine: NEGATIVE
Glucose, UA: NEGATIVE mg/dL
Hgb urine dipstick: NEGATIVE
Ketones, ur: NEGATIVE mg/dL
Nitrite: NEGATIVE
Protein, ur: NEGATIVE mg/dL
Specific Gravity, Urine: 1.006 (ref 1.005–1.030)
pH: 6 (ref 5.0–8.0)

## 2019-09-25 MED ORDER — PROBIOTIC PO CAPS: 1.0000 | ORAL_CAPSULE | Freq: Every day | ORAL | 0 refills | Status: DC

## 2019-09-25 MED ORDER — SODIUM CHLORIDE 0.9 % IV SOLN
1.0000 g | Freq: Once | INTRAVENOUS | Status: AC
  Administered 2019-09-25: 1 g via INTRAVENOUS
  Filled 2019-09-25: qty 10

## 2019-09-25 MED ORDER — CEPHALEXIN 500 MG PO CAPS: 500.0000 mg | ORAL_CAPSULE | Freq: Two times a day (BID) | ORAL | 0 refills | Status: DC

## 2019-09-25 NOTE — ED Notes (Signed)
[facility] called for transport.  

## 2019-09-26 DIAGNOSIS — I130 Hypertensive heart and chronic kidney disease with heart failure and stage 1 through stage 4 chronic kidney disease, or unspecified chronic kidney disease: Secondary | ICD-10-CM | POA: Diagnosis not present

## 2019-09-26 DIAGNOSIS — M1990 Unspecified osteoarthritis, unspecified site: Secondary | ICD-10-CM | POA: Diagnosis not present

## 2019-09-26 DIAGNOSIS — I5042 Chronic combined systolic (congestive) and diastolic (congestive) heart failure: Secondary | ICD-10-CM | POA: Diagnosis not present

## 2019-09-26 DIAGNOSIS — G8929 Other chronic pain: Secondary | ICD-10-CM | POA: Diagnosis not present

## 2019-09-26 DIAGNOSIS — I420 Dilated cardiomyopathy: Secondary | ICD-10-CM | POA: Diagnosis not present

## 2019-09-26 DIAGNOSIS — K5790 Diverticulosis of intestine, part unspecified, without perforation or abscess without bleeding: Secondary | ICD-10-CM | POA: Diagnosis not present

## 2019-09-26 DIAGNOSIS — Z86718 Personal history of other venous thrombosis and embolism: Secondary | ICD-10-CM | POA: Diagnosis not present

## 2019-09-26 DIAGNOSIS — N183 Chronic kidney disease, stage 3 unspecified: Secondary | ICD-10-CM | POA: Diagnosis not present

## 2019-09-26 DIAGNOSIS — D631 Anemia in chronic kidney disease: Secondary | ICD-10-CM | POA: Diagnosis not present

## 2019-09-26 DIAGNOSIS — G931 Anoxic brain damage, not elsewhere classified: Secondary | ICD-10-CM | POA: Diagnosis not present

## 2019-09-26 DIAGNOSIS — E1122 Type 2 diabetes mellitus with diabetic chronic kidney disease: Secondary | ICD-10-CM | POA: Diagnosis not present

## 2019-09-26 DIAGNOSIS — H538 Other visual disturbances: Secondary | ICD-10-CM | POA: Diagnosis not present

## 2019-09-26 DIAGNOSIS — M48061 Spinal stenosis, lumbar region without neurogenic claudication: Secondary | ICD-10-CM | POA: Diagnosis not present

## 2019-09-26 DIAGNOSIS — M797 Fibromyalgia: Secondary | ICD-10-CM | POA: Diagnosis not present

## 2019-09-26 DIAGNOSIS — I2510 Atherosclerotic heart disease of native coronary artery without angina pectoris: Secondary | ICD-10-CM | POA: Diagnosis not present

## 2019-09-26 DIAGNOSIS — I69398 Other sequelae of cerebral infarction: Secondary | ICD-10-CM | POA: Diagnosis not present

## 2019-09-26 DIAGNOSIS — M7501 Adhesive capsulitis of right shoulder: Secondary | ICD-10-CM | POA: Diagnosis not present

## 2019-09-26 DIAGNOSIS — M0589 Other rheumatoid arthritis with rheumatoid factor of multiple sites: Secondary | ICD-10-CM | POA: Diagnosis not present

## 2019-09-26 DIAGNOSIS — R1312 Dysphagia, oropharyngeal phase: Secondary | ICD-10-CM | POA: Diagnosis not present

## 2019-09-26 DIAGNOSIS — M1A9XX1 Chronic gout, unspecified, with tophus (tophi): Secondary | ICD-10-CM | POA: Diagnosis not present

## 2019-09-26 DIAGNOSIS — E1151 Type 2 diabetes mellitus with diabetic peripheral angiopathy without gangrene: Secondary | ICD-10-CM | POA: Diagnosis not present

## 2019-09-26 DIAGNOSIS — I4892 Unspecified atrial flutter: Secondary | ICD-10-CM | POA: Diagnosis not present

## 2019-09-26 LAB — URINE CULTURE: Culture: >=100000 — AB

## 2019-09-27 ENCOUNTER — Telehealth: Payer: Self-pay | Admitting: Emergency Medicine

## 2019-09-27 NOTE — Telephone Encounter (Signed)
Post ED Visit - Positive Culture Follow-up  Culture report reviewed by antimicrobial stewardship pharmacist: [facility] Team []  Elenor Quinones, Pharm.D. []  Heide Guile, Pharm.D., BCPS AQ-ID []  Parks Neptune, Pharm.D., BCPS [x]  Alycia Rossetti, Pharm.D., BCPS []  [place], Pharm.D., BCPS, AAHIVP []  Legrand Como, Pharm.D., BCPS, AAHIVP []  Salome Arnt, PharmD, BCPS []  Johnnette Gourd, PharmD, BCPS []  Hughes Better, PharmD, BCPS []  Leeroy Cha, PharmD []  Laqueta Linden, PharmD, BCPS []  Albertina Parr, PharmD  [facility] Team []  Leodis Sias, PharmD []  Lindell Spar, PharmD []  Royetta Asal, PharmD []  Graylin Shiver, Rph []  Rema Fendt) Glennon Mac, PharmD []  Arlyn Dunning, PharmD []  Netta Cedars, PharmD []  Dia Sitter, PharmD []  Leone Haven, PharmD []  Gretta Arab, PharmD []  Theodis Shove, PharmD []  Peggyann Juba, PharmD []  Reuel Boom, PharmD   Positive urine culture Treated with cephalexin, organism sensitive to the same and no further patient follow-up is required at this time.  Hazle Nordmann 09/27/2019, 1:48 PM

## 2019-09-28 ENCOUNTER — Encounter: Payer: Self-pay | Admitting: Cardiovascular Disease

## 2019-09-28 ENCOUNTER — Other Ambulatory Visit: Payer: Self-pay

## 2019-09-28 ENCOUNTER — Ambulatory Visit (INDEPENDENT_AMBULATORY_CARE_PROVIDER_SITE_OTHER): Payer: Medicare Other | Admitting: Cardiovascular Disease

## 2019-09-28 VITALS — BP 93/57 | HR 81 | Temp 96.8°F | Ht 64.0 in | Wt 180.0 lb

## 2019-09-28 DIAGNOSIS — I5042 Chronic combined systolic (congestive) and diastolic (congestive) heart failure: Secondary | ICD-10-CM | POA: Diagnosis not present

## 2019-09-28 DIAGNOSIS — I2510 Atherosclerotic heart disease of native coronary artery without angina pectoris: Secondary | ICD-10-CM | POA: Diagnosis not present

## 2019-09-28 DIAGNOSIS — I10 Essential (primary) hypertension: Secondary | ICD-10-CM

## 2019-09-28 DIAGNOSIS — I2782 Chronic pulmonary embolism: Secondary | ICD-10-CM | POA: Diagnosis not present

## 2019-09-28 DIAGNOSIS — Z5181 Encounter for therapeutic drug level monitoring: Secondary | ICD-10-CM

## 2019-09-28 MED ORDER — HYDRALAZINE HCL 25 MG PO TABS: 25.0000 mg | ORAL_TABLET | Freq: Three times a day (TID) | ORAL | 1 refills | Status: DC

## 2019-09-28 NOTE — Patient Instructions (Signed)
Medication Instructions:  DECREASE YOUR HYDRALAZINE TO 25 MG THREE TIMES A DAY   *If you need a refill on your cardiac medications before your next appointment, please call your pharmacy*  Lab Work: FASTING LP/CMET SOON   If you have labs (blood work) drawn today and your tests are completely normal, you will receive your results only by: Marland Kitchen MyChart Message (if you have MyChart) OR . A paper copy in the mail If you have any lab test that is abnormal or we need to change your treatment, we will call you to review the results.  Testing/Procedures: NONE  Follow-Up: At [facility], you and your health needs are our priority.  As part of our continuing mission to provide you with exceptional heart care, we have created designated Provider Care Teams.  These Care Teams include your primary Cardiologist (physician) and Advanced Practice Providers (APPs -  Physician Assistants and Nurse Practitioners) who all work together to provide you with the care you need, when you need it.  Your next appointment:   6 week(s)  The format for your next appointment:   Virtual Visit   Provider:   You may see Skeet Latch, MD or one of the following Advanced Practice Providers on your designated Care Team:    Kerin Ransom, PA-C  [place], [place]  Coletta Memos, 

## 2019-09-28 NOTE — Progress Notes (Signed)
6   Cardiology Office Note   Date:  09/28/2019   ID:  [name] [name], DOB 05-23-1930, MRN [id number]  PCP:  Ronnald Nian, DO  Cardiologist:   Skeet Latch, MD   No chief complaint on file.   History of Present Illness: [name] [name] is a 85 y.o. female with chronic systolic and diastolic heart failure, hypertension, Rheumatoid Arthritis, diabetes type 2, CAD s/p MI, and CVA who presents for follow up. She was initially seen in 2016 for management of chronic diastolic heart failure. [name] [name] was admitted to the [facility] Medicine service 11/1-11/3 with chest pain. At that time she reported constant chest pain that began in the setting of a cold. CT scan of the chest was concerning for edema. She received IV lasix and diuresed with improvement in her breathing. She was also started on oral lasix. At her initial appointment she was feeling well. She was admitted 05/2018 with weakness, chest discomfort, and shortness of breath. She was found to have pneumonia and heart failure. BNP was also elevated to 2000 and troponin was mildly elevated to 0.06. Echocardiogram 05/29/2018 revealed LVEF 20 to 25% and moderate pulmonary hypertension. She underwent left and right heart catheterization and had no obstructive CAD.  [name] [name] was admitted 06/2018 with gallstone pancreatitis. Cardiology was consulted and she was felt to be at acceptable risk for surgery. She underwent laparoscopic cholecystectomy and recovered quite well.She was discharged on Lasix 40 mg but due to confusion was only taking 20 mg. After increasing dose to 40 mg her weight returned to her baseline of 170 to 171 pounds. Losartan was switched to [facility]. She had a follow up echo 08/2018 that revealed LVEF 60 to 65% with grade 1 diastolic dysfunction. There is mild mitral stenosis with a mild to moderately calcified annulus. She was admitted 12/2018 for hand surgery and received cardiology clearance for both  hand and cervical surgery.   She was later admitted 01/2019 with encephalopathy and fever of unclear etiology.  Troponin was elevated to 0.04 and flat.  She had no chest pain.  She was discharged to a skilled nursing facility. She has since been discharged home.  [name] [name] saw [name] Ransom, PA-C for cardiac clearance prior to lumbar decompression and was found to be volume overloaded.  Lasix was increased for three days and hydralazine was reduced.  BNP was 2000.  Lately she has been feeling well.  She has no edema and her breathing has been stable.  She is doing PT.  Her daughter controls her diet and limits sodium intake.  She has a cough that has been awakening her at night.  Her PCP prescribed tessalon pearls which helps.  CXR was negatvie 11/15.  She has no edema, orthopnea or shortness of breath.  She denies chest pain or pressure.  She was seen in the ED 11/15 with a UTI and AMS.   Past Medical History:  Diagnosis Date   Allergic rhinitis due to pollen 04/27/2007   Arthritis    "in q joint" (08/07/2013)   CHF (congestive heart failure) (HCC)    Coronary atherosclerosis of native coronary artery    Cough    Edema 05/03/2013   Exertional shortness of breath    "sometimes" (08/07/2013)   First degree atrioventricular block    Gout, unspecified 04/19/2013   Hyperlipidemia 04/27/2007   Hypertension    Midline low back pain without sciatica 06/27/2014   Muscle spasm    Muscle weakness (generalized)  Onychia and paronychia of toe    Osteoarthrosis, unspecified whether generalized or localized, unspecified site 04/27/2007   Other fall    Other malaise and fatigue    Pneumonia 05/2018   Prepatellar bursitis    Pulmonary embolism ([place]) 07/2018   Rheumatic nodule ([place]) 06/28/2017   Seizures ([place])    Stroke ([place]) 01/17/2014   Type II diabetes mellitus ([place])    "fasting 90-110s" (08/07/2013)   Unspecified vitamin D deficiency     Past Surgical History:    Procedure Laterality Date   ABDOMINAL HYSTERECTOMY  04/1980   APPENDECTOMY  1960   CARDIAC CATHETERIZATION  2003   CATARACT EXTRACTION W/ INTRAOCULAR LENS  IMPLANT, BILATERAL Bilateral ~ 2012   CHOLECYSTECTOMY N/A 06/26/2018   Procedure: LAPAROSCOPIC CHOLECYSTECTOMY POSSIBLE INTRAOPERATIVE CHOLANGIOGRAM;  Surgeon: Coralie Keens, MD;  Location: [facility];  Service: General;  Laterality: N/A;   JOINT REPLACEMENT     REPLACEMENT TOTAL KNEE Right 09/2005   RIGHT/LEFT HEART CATH AND CORONARY ANGIOGRAPHY N/A 06/01/2018   Procedure: RIGHT/LEFT HEART CATH AND CORONARY ANGIOGRAPHY;  Surgeon: Jettie Booze, MD;  Location: [place] [facility];  Service: Cardiovascular;  Laterality: N/A;   SHOULDER OPEN ROTATOR CUFF REPAIR Right 07/1999   TONSILLECTOMY  08/1974   TOTAL KNEE ARTHROPLASTY Left 08/07/2013   Procedure: TOTAL KNEE ARTHROPLASTY- LEFT;  Surgeon: Vickey Huger, MD;  Location: [place];  Service: Orthopedics;  Laterality: Left;   TRANSTHORACIC ECHOCARDIOGRAM  2003   EF 55-65%; mild concentric LVH     Current Outpatient Medications  Medication Sig Dispense Refill   acetaminophen (TYLENOL) 500 MG tablet Take 1,000 mg by mouth every 12 (twelve) hours.      albuterol (PROVENTIL HFA;VENTOLIN HFA) 108 (90 Base) MCG/ACT inhaler Inhale 1-2 puffs into the lungs every 6 (six) hours as needed for wheezing or shortness of breath. 1 Inhaler 0   allopurinol (ZYLOPRIM) 100 MG tablet TAKE 1 TABLET BY MOUTH EVERY DAY AT BEDTIME 90 tablet 3   benzonatate (TESSALON) 100 MG capsule Take 1 capsule (100 mg total) by mouth 2 (two) times daily as needed for cough. 20 capsule 0   Blood Glucose Monitoring Suppl (ACCU-CHEK AVIVA PLUS) w/Device KIT Use to check blood sugars 1-2 times daily. 1 kit 0   carvedilol (COREG) 25 MG tablet TAKE 1 TABLET BY MOUTH TWICE DAILY 180 tablet 3   cephALEXin (KEFLEX) 500 MG capsule Take 1 capsule (500 mg total) by mouth 2 (two) times daily. 14 capsule 0   D3-1000 25  MCG (1000 UT) capsule Take 1 capsule (1,000 Units total) by mouth daily. (Patient taking differently: Take 1,000 Units by mouth at bedtime. ) 30 capsule 0   docusate sodium (COLACE) 100 MG capsule Take 1 capsule (100 mg total) by mouth daily as needed for mild constipation. 10 capsule 0   ENSURE (ENSURE) Take 237 mLs by mouth every evening. 237 mL 12   ENTRESTO 49-51 MG TAKE 1 TABLET BY MOUTH EVERY DAY 90 tablet 3   ferrous sulfate 325 (65 FE) MG tablet Take 325 mg by mouth every other day.     furosemide (LASIX) 40 MG tablet TAKE 1 TABLET BY MOUTH TWICE DAILY 180 tablet 3   gabapentin (NEURONTIN) 100 MG capsule TAKE 1 CAPSULE BY MOUTH THREE TIMES DAILY 270 capsule 3   glucose blood (ACCU-CHEK AVIVA PLUS) test strip Use to test blood sugars 1-2 times daily. 100 each 12   guaiFENesin-codeine (ROBITUSSIN AC) 100-10 MG/5ML syrup Take 5 mLs by mouth 2 (two)  times daily as needed for cough. 120 mL 0   hydrALAZINE (APRESOLINE) 25 MG tablet Take 1 tablet (25 mg total) by mouth 3 (three) times daily. 270 tablet 1   hydroxychloroquine (PLAQUENIL) 200 MG tablet Take 1 tablet (200 mg total) by mouth 2 (two) times daily. 60 tablet 0   hydroxypropyl methylcellulose / hypromellose (ISOPTO TEARS / GONIOVISC) 2.5 % ophthalmic solution Place 1 drop into both eyes daily. 15 mL 0   isosorbide mononitrate (IMDUR) 30 MG 24 hr tablet TAKE 1 TABLET BY MOUTH EVERY DAY 90 tablet 3   JANUVIA 25 MG tablet TAKE 1/2 TABLET BY MOUTH(12,5 MG) EVERY DAY 45 tablet 3   Lancets (ACCU-CHEK SOFT TOUCH) lancets Use to check blood sugars 1-2 times daily. 100 each 12   leflunomide (ARAVA) 20 MG tablet Take 1 tablet (20 mg total) by mouth daily. 30 tablet 0   Lidocaine (ASPERCREME LIDOCAINE) 4 % PTCH Apply 1 Package topically daily.      LORazepam (ATIVAN) 0.5 MG tablet Take 0.25 mg by mouth as needed for anxiety.      Melatonin 3 MG TABS Take 6 mg by mouth at bedtime.      Olopatadine HCl 0.2 % SOLN Place 1 drop into  both eyes daily. 2.5 mL 0   potassium chloride SA (K-DUR) 20 MEQ tablet Take 20 mEq by mouth See admin instructions. Monday and Thursday     predniSONE (DELTASONE) 10 MG tablet Take 10 mg by mouth daily with breakfast.     Probiotic CAPS Take 1 capsule by mouth daily. 30 capsule 0   rivaroxaban (XARELTO) 20 MG TABS tablet Take 1 tablet (20 mg total) by mouth daily with supper. 30 tablet 0   saccharomyces boulardii (FLORASTOR) 250 MG capsule Take 250 mg by mouth every other day.     traMADol (ULTRAM) 50 MG tablet Take 100 mg by mouth 3 (three) times daily as needed.     No current facility-administered medications for this visit.     Allergies:   Clonidine derivatives, Fish allergy, Shellfish allergy, Doxycycline, Indomethacin, Lyrica [pregabalin], Methyldopa, Morphine and related, Orange fruit [citrus], Strawberry (diagnostic), Cetirizine hcl, Codeine, Levaquin [levofloxacin in d5w], and Tomato    Social History:  The patient  reports that she has never smoked. She has never used smokeless tobacco. She reports that she does not drink alcohol or use drugs.   Family History:  The patient's family history includes Heart attack in her father.    ROS:  Please see the history of present illness.   Otherwise, review of systems are positive for allergies.   All other systems are reviewed and negative.    PHYSICAL EXAM: VS:  BP (!) 93/57 (BP Location: Right Arm)    Pulse 81    Temp (!) 96.8 F (36 C)    Ht 5' 4"  (1.626 m)    Wt 180 lb (81.6 kg)    SpO2 96%    BMI 30.90 kg/m  , BMI Body mass index is 30.9 kg/m. GENERAL:  Well appearing. No acute distress HEENT: Pupils equal round and reactive, fundi not visualized, oral mucosa unremarkable NECK:  No jugular venous distention, waveform within normal limits, carotid upstroke brisk and symmetric, no bruits LUNGS:  Clear to auscultation bilaterally HEART:  RRR.  PMI not displaced or sustained,S1 and S2 within normal limits, no S3, no S4, no  clicks, no rubs, no murmurs ABD:  Flat, positive bowel sounds normal in frequency in pitch, no bruits, no rebound,  no guarding, no midline pulsatile mass, no hepatomegaly, no splenomegaly EXT:  2 plus pulses throughout, trace edema, no cyanosis no clubbing SKIN:  No rashes no nodules NEURO:  Cranial nerves II through XII grossly intact, motor grossly intact throughout PSYCH:  Cognitively intact, oriented to person place and time   EKG:  EKG is not ordered today.  LHC 8/67/61:  LV end diastolic pressure is moderately elevated.  There is no aortic valve stenosis.  Hemodynamic findings consistent with moderate pulmonary hypertension.  CO 5.68 L/min; CI 2.97; Ao sat 99%; PA sat 65%; mean PA 42 mm Hg; mean PCWP 28 mm Hg  Nonobstructive CAD.   Nonischemic cardiomyopathy with mild to moderate CAD.   Echo 05/29/18: Study Conclusions  - Left ventricle: The cavity size was normal. Wall thickness was   increased in a pattern of moderate LVH. Systolic function was   severely reduced. The estimated ejection fraction was in the   range of 20% to 25%. Although no diagnostic regional wall motion   abnormality was identified, this possibility cannot be completely   excluded on the basis of this study. - Aortic valve: Mildly calcified annulus. Trileaflet; normal   thickness, moderately calcified leaflets. There was mild   regurgitation. - Mitral valve: Mildly calcified annulus. Mildly calcified leaflets   . There was mild to moderate regurgitation. - Left atrium: The atrium was moderately to severely dilated. - Right ventricle: The cavity size was mildly dilated. Wall   thickness was normal. Systolic function was mildly reduced. - Right atrium: The atrium was moderately dilated. - Atrial septum: There was a small patent foramen ovale. There was   a left-to-right shunt. - Tricuspid valve: There was moderate regurgitation. - Pulmonary arteries: Systolic pressure was moderately increased.    PA peak pressure: 47 mm Hg (S). - Pericardium, extracardiac: A trivial pericardial effusion was   identified.  Echo 01/18/14: Study Conclusions  - Left ventricle: The cavity size was normal. Wall thickness was increased in a pattern of moderate to severe LVH. There was moderate focal basal hypertrophy of the septum. Systolic function was normal. The estimated ejection fraction was in the range of 60% to 65%. Wall motion was normal; there were no regional wall motion abnormalities. Doppler parameters are consistent with abnormal left ventricular relaxation (grade 1 diastolic dysfunction). - Left atrium: The atrium was moderately dilated. - Pulmonary arteries: Systolic pressure was mildly increased.  Carotid ultrasound 01/18/14: Summary: Bilateral: intimal wall thickening CCA. 1-39% ICA stenosis. Vertebral artery flow is antegrade.   Recent Labs: 01/12/2019: Magnesium 1.9 03/29/2019: TSH 1.521 05/29/2019: NT-Pro BNP 14,600 09/13/2019: B Natriuretic Peptide 921.6 09/24/2019: ALT 10; BUN 28; Creatinine, Ser 1.16; Hemoglobin 8.9; Platelets 263; Potassium 3.7; Sodium 140    Lipid Panel    Component Value Date/Time   CHOL 202 (H) 06/25/2018 0607   CHOL 185 03/04/2016 1620   TRIG 67 06/25/2018 0607   HDL 63 06/25/2018 0607   HDL 73 03/04/2016 1620   CHOLHDL 3.2 06/25/2018 0607   VLDL 13 06/25/2018 0607   LDLCALC 126 (H) 06/25/2018 0607   LDLCALC 89 03/04/2016 1620      Wt Readings from Last 3 Encounters:  09/28/19 180 lb (81.6 kg)  09/24/19 180 lb (81.6 kg)  06/05/19 184 lb (83.5 kg)      ASSESSMENT AND PLAN:  # Chronic systolic and diastolic heart failure:Systolic function normalized and then was 30-35% on echo 05/2019. She is euvolemic and has no heart failure symptoms.Continue carvedilol, hydralazine, isosorbide mononitrate,  lasix,and Entresto.  No ICD given age.  Consider spironolactone at follow up.  # Hypertension: BP is too low.  Reduce  hydralazine to 70m tid. Continue Entresto, carvedilol, and Imdur.  # Chest pain:Resolved. No CAD on cath.  # CAD s/p MI:Ms. WMccallumreportedly had an MI in the 132s She had minimal, non-obstructive disease on cath 05/2018.  Continue carvedilol.She is not interested in a statin.  She will have fasting lipids and CMP checked.   # PE: Ms. WMartinehad a PE 07/2018 soon after having pneumonia. She is on Xarelto.  Given that she remains inactive and requires a Hoyer lift to move, continue indefinitely.   Current medicines are reviewed at length with the patient today.  The patient does not have concerns regarding medicines.  The following changes have been made:  Reduce hydralazine  Labs/ tests ordered today include:   Orders Placed This Encounter  Procedures   Lipid Profile   Comprehensive Metabolic Panel (CMET)     Disposition:   FU with Jadalyn Oliveri C. ROval Linsey MD, FThird Street Surgery Center LPin 6 weeks   Signed, TBainbridgeROval Linsey MD, FCoastal Surgery Center LLC 09/28/2019 5:21 PM    [place]

## 2019-09-30 DIAGNOSIS — D631 Anemia in chronic kidney disease: Secondary | ICD-10-CM | POA: Diagnosis not present

## 2019-09-30 DIAGNOSIS — N183 Chronic kidney disease, stage 3 unspecified: Secondary | ICD-10-CM | POA: Diagnosis not present

## 2019-09-30 DIAGNOSIS — M797 Fibromyalgia: Secondary | ICD-10-CM | POA: Diagnosis not present

## 2019-09-30 DIAGNOSIS — H538 Other visual disturbances: Secondary | ICD-10-CM | POA: Diagnosis not present

## 2019-09-30 DIAGNOSIS — R1312 Dysphagia, oropharyngeal phase: Secondary | ICD-10-CM | POA: Diagnosis not present

## 2019-09-30 DIAGNOSIS — I69398 Other sequelae of cerebral infarction: Secondary | ICD-10-CM | POA: Diagnosis not present

## 2019-09-30 DIAGNOSIS — M48061 Spinal stenosis, lumbar region without neurogenic claudication: Secondary | ICD-10-CM | POA: Diagnosis not present

## 2019-09-30 DIAGNOSIS — K5790 Diverticulosis of intestine, part unspecified, without perforation or abscess without bleeding: Secondary | ICD-10-CM | POA: Diagnosis not present

## 2019-09-30 DIAGNOSIS — E1151 Type 2 diabetes mellitus with diabetic peripheral angiopathy without gangrene: Secondary | ICD-10-CM | POA: Diagnosis not present

## 2019-09-30 DIAGNOSIS — Z86718 Personal history of other venous thrombosis and embolism: Secondary | ICD-10-CM | POA: Diagnosis not present

## 2019-09-30 DIAGNOSIS — I4892 Unspecified atrial flutter: Secondary | ICD-10-CM | POA: Diagnosis not present

## 2019-09-30 DIAGNOSIS — M0589 Other rheumatoid arthritis with rheumatoid factor of multiple sites: Secondary | ICD-10-CM | POA: Diagnosis not present

## 2019-09-30 DIAGNOSIS — M1990 Unspecified osteoarthritis, unspecified site: Secondary | ICD-10-CM | POA: Diagnosis not present

## 2019-09-30 DIAGNOSIS — M1A9XX1 Chronic gout, unspecified, with tophus (tophi): Secondary | ICD-10-CM | POA: Diagnosis not present

## 2019-09-30 DIAGNOSIS — I2510 Atherosclerotic heart disease of native coronary artery without angina pectoris: Secondary | ICD-10-CM | POA: Diagnosis not present

## 2019-09-30 DIAGNOSIS — I130 Hypertensive heart and chronic kidney disease with heart failure and stage 1 through stage 4 chronic kidney disease, or unspecified chronic kidney disease: Secondary | ICD-10-CM | POA: Diagnosis not present

## 2019-09-30 DIAGNOSIS — E1122 Type 2 diabetes mellitus with diabetic chronic kidney disease: Secondary | ICD-10-CM | POA: Diagnosis not present

## 2019-09-30 DIAGNOSIS — G8929 Other chronic pain: Secondary | ICD-10-CM | POA: Diagnosis not present

## 2019-09-30 DIAGNOSIS — I5042 Chronic combined systolic (congestive) and diastolic (congestive) heart failure: Secondary | ICD-10-CM | POA: Diagnosis not present

## 2019-09-30 DIAGNOSIS — M7501 Adhesive capsulitis of right shoulder: Secondary | ICD-10-CM | POA: Diagnosis not present

## 2019-09-30 DIAGNOSIS — I420 Dilated cardiomyopathy: Secondary | ICD-10-CM | POA: Diagnosis not present

## 2019-09-30 DIAGNOSIS — G931 Anoxic brain damage, not elsewhere classified: Secondary | ICD-10-CM | POA: Diagnosis not present

## 2019-10-01 DIAGNOSIS — M4800 Spinal stenosis, site unspecified: Secondary | ICD-10-CM | POA: Diagnosis not present

## 2019-10-02 ENCOUNTER — Telehealth: Payer: Self-pay

## 2019-10-02 ENCOUNTER — Other Ambulatory Visit: Payer: Self-pay | Admitting: Family Medicine

## 2019-10-02 DIAGNOSIS — I130 Hypertensive heart and chronic kidney disease with heart failure and stage 1 through stage 4 chronic kidney disease, or unspecified chronic kidney disease: Secondary | ICD-10-CM

## 2019-10-02 DIAGNOSIS — N183 Chronic kidney disease, stage 3 unspecified: Secondary | ICD-10-CM

## 2019-10-02 DIAGNOSIS — I824Z1 Acute embolism and thrombosis of unspecified deep veins of right distal lower extremity: Secondary | ICD-10-CM

## 2019-10-02 NOTE — Telephone Encounter (Signed)
Copied from [place] [phone]. Topic: General - Other >> Oct 02, 2019  8:08 AM Celene Kras wrote: Reason for CRM: Pts daughter called stating the pt has been receiving PT since she has been out of rehab center. Pts PT ended on 09/30/2019. Pts daughter is requesting to have new orders placed for pt to continue PT. Please advise.

## 2019-10-03 ENCOUNTER — Telehealth: Payer: Self-pay | Admitting: Family Medicine

## 2019-10-03 NOTE — Telephone Encounter (Signed)
Pt daughter Excell Seltzer called and would like to know if pt should still take medication XARELTO. Tomasa Hosteller states that pt seems cold all the time. Please advise

## 2019-10-04 NOTE — Telephone Encounter (Signed)
I called and spoke with pt's daughter. She is aware new PT order was placed.

## 2019-10-04 NOTE — Addendum Note (Signed)
Addended by: Rodrigo Ran on: 10/04/2019 08:20 AM   Modules accepted: Orders

## 2019-10-04 NOTE — Telephone Encounter (Signed)
Yes she likely needs to be on xarelto indefinitely due to the PE in 2019 and the fact that she is sedentary, requires hoyer lift to move, etc. Her immobility places her at risk for another thrombotic event so xarelto is needed

## 2019-10-04 NOTE — Telephone Encounter (Signed)
Ok to place referral for home PT to continue.

## 2019-10-04 NOTE — Telephone Encounter (Signed)
I called and spoke with pt's daughter. She is aware of message below.

## 2019-10-06 DIAGNOSIS — G931 Anoxic brain damage, not elsewhere classified: Secondary | ICD-10-CM | POA: Diagnosis not present

## 2019-10-06 DIAGNOSIS — E1151 Type 2 diabetes mellitus with diabetic peripheral angiopathy without gangrene: Secondary | ICD-10-CM | POA: Diagnosis not present

## 2019-10-06 DIAGNOSIS — D631 Anemia in chronic kidney disease: Secondary | ICD-10-CM | POA: Diagnosis not present

## 2019-10-06 DIAGNOSIS — M1A9XX1 Chronic gout, unspecified, with tophus (tophi): Secondary | ICD-10-CM | POA: Diagnosis not present

## 2019-10-06 DIAGNOSIS — M797 Fibromyalgia: Secondary | ICD-10-CM | POA: Diagnosis not present

## 2019-10-06 DIAGNOSIS — G8929 Other chronic pain: Secondary | ICD-10-CM | POA: Diagnosis not present

## 2019-10-06 DIAGNOSIS — M0589 Other rheumatoid arthritis with rheumatoid factor of multiple sites: Secondary | ICD-10-CM | POA: Diagnosis not present

## 2019-10-06 DIAGNOSIS — I69398 Other sequelae of cerebral infarction: Secondary | ICD-10-CM | POA: Diagnosis not present

## 2019-10-06 DIAGNOSIS — M1990 Unspecified osteoarthritis, unspecified site: Secondary | ICD-10-CM | POA: Diagnosis not present

## 2019-10-06 DIAGNOSIS — H538 Other visual disturbances: Secondary | ICD-10-CM | POA: Diagnosis not present

## 2019-10-06 DIAGNOSIS — R1312 Dysphagia, oropharyngeal phase: Secondary | ICD-10-CM | POA: Diagnosis not present

## 2019-10-06 DIAGNOSIS — I4892 Unspecified atrial flutter: Secondary | ICD-10-CM | POA: Diagnosis not present

## 2019-10-06 DIAGNOSIS — M48061 Spinal stenosis, lumbar region without neurogenic claudication: Secondary | ICD-10-CM | POA: Diagnosis not present

## 2019-10-06 DIAGNOSIS — K5790 Diverticulosis of intestine, part unspecified, without perforation or abscess without bleeding: Secondary | ICD-10-CM | POA: Diagnosis not present

## 2019-10-06 DIAGNOSIS — I130 Hypertensive heart and chronic kidney disease with heart failure and stage 1 through stage 4 chronic kidney disease, or unspecified chronic kidney disease: Secondary | ICD-10-CM | POA: Diagnosis not present

## 2019-10-06 DIAGNOSIS — E1122 Type 2 diabetes mellitus with diabetic chronic kidney disease: Secondary | ICD-10-CM | POA: Diagnosis not present

## 2019-10-06 DIAGNOSIS — M7501 Adhesive capsulitis of right shoulder: Secondary | ICD-10-CM | POA: Diagnosis not present

## 2019-10-06 DIAGNOSIS — N183 Chronic kidney disease, stage 3 unspecified: Secondary | ICD-10-CM | POA: Diagnosis not present

## 2019-10-06 DIAGNOSIS — I420 Dilated cardiomyopathy: Secondary | ICD-10-CM | POA: Diagnosis not present

## 2019-10-06 DIAGNOSIS — I5042 Chronic combined systolic (congestive) and diastolic (congestive) heart failure: Secondary | ICD-10-CM | POA: Diagnosis not present

## 2019-10-06 DIAGNOSIS — Z86718 Personal history of other venous thrombosis and embolism: Secondary | ICD-10-CM | POA: Diagnosis not present

## 2019-10-06 DIAGNOSIS — I2510 Atherosclerotic heart disease of native coronary artery without angina pectoris: Secondary | ICD-10-CM | POA: Diagnosis not present

## 2019-10-09 ENCOUNTER — Telehealth: Payer: Self-pay

## 2019-10-09 DIAGNOSIS — D631 Anemia in chronic kidney disease: Secondary | ICD-10-CM | POA: Diagnosis not present

## 2019-10-09 DIAGNOSIS — M0589 Other rheumatoid arthritis with rheumatoid factor of multiple sites: Secondary | ICD-10-CM | POA: Diagnosis not present

## 2019-10-09 DIAGNOSIS — E1122 Type 2 diabetes mellitus with diabetic chronic kidney disease: Secondary | ICD-10-CM | POA: Diagnosis not present

## 2019-10-09 DIAGNOSIS — E1151 Type 2 diabetes mellitus with diabetic peripheral angiopathy without gangrene: Secondary | ICD-10-CM | POA: Diagnosis not present

## 2019-10-09 DIAGNOSIS — I4892 Unspecified atrial flutter: Secondary | ICD-10-CM | POA: Diagnosis not present

## 2019-10-09 DIAGNOSIS — I420 Dilated cardiomyopathy: Secondary | ICD-10-CM | POA: Diagnosis not present

## 2019-10-09 DIAGNOSIS — M7501 Adhesive capsulitis of right shoulder: Secondary | ICD-10-CM | POA: Diagnosis not present

## 2019-10-09 DIAGNOSIS — G8929 Other chronic pain: Secondary | ICD-10-CM | POA: Diagnosis not present

## 2019-10-09 DIAGNOSIS — Z86718 Personal history of other venous thrombosis and embolism: Secondary | ICD-10-CM | POA: Diagnosis not present

## 2019-10-09 DIAGNOSIS — I130 Hypertensive heart and chronic kidney disease with heart failure and stage 1 through stage 4 chronic kidney disease, or unspecified chronic kidney disease: Secondary | ICD-10-CM | POA: Diagnosis not present

## 2019-10-09 DIAGNOSIS — K5790 Diverticulosis of intestine, part unspecified, without perforation or abscess without bleeding: Secondary | ICD-10-CM | POA: Diagnosis not present

## 2019-10-09 DIAGNOSIS — M797 Fibromyalgia: Secondary | ICD-10-CM | POA: Diagnosis not present

## 2019-10-09 DIAGNOSIS — N183 Chronic kidney disease, stage 3 unspecified: Secondary | ICD-10-CM | POA: Diagnosis not present

## 2019-10-09 DIAGNOSIS — I5042 Chronic combined systolic (congestive) and diastolic (congestive) heart failure: Secondary | ICD-10-CM | POA: Diagnosis not present

## 2019-10-09 DIAGNOSIS — H538 Other visual disturbances: Secondary | ICD-10-CM | POA: Diagnosis not present

## 2019-10-09 DIAGNOSIS — M48061 Spinal stenosis, lumbar region without neurogenic claudication: Secondary | ICD-10-CM | POA: Diagnosis not present

## 2019-10-09 DIAGNOSIS — I69398 Other sequelae of cerebral infarction: Secondary | ICD-10-CM | POA: Diagnosis not present

## 2019-10-09 DIAGNOSIS — G931 Anoxic brain damage, not elsewhere classified: Secondary | ICD-10-CM | POA: Diagnosis not present

## 2019-10-09 DIAGNOSIS — M1990 Unspecified osteoarthritis, unspecified site: Secondary | ICD-10-CM | POA: Diagnosis not present

## 2019-10-09 DIAGNOSIS — I2510 Atherosclerotic heart disease of native coronary artery without angina pectoris: Secondary | ICD-10-CM | POA: Diagnosis not present

## 2019-10-09 DIAGNOSIS — M1A9XX1 Chronic gout, unspecified, with tophus (tophi): Secondary | ICD-10-CM | POA: Diagnosis not present

## 2019-10-09 DIAGNOSIS — R1312 Dysphagia, oropharyngeal phase: Secondary | ICD-10-CM | POA: Diagnosis not present

## 2019-10-09 NOTE — Telephone Encounter (Signed)
They would like to use [facility], not [facility].   Copied from [place] [phone]. Topic: General - Call Back - No Documentation >> Oct 09, 2019  3:40 PM Erick Blinks wrote: Reason for CRM: Pt's daughter called requesting approval for insurance for in home therapy. [employer] "[facility]"

## 2019-10-10 DIAGNOSIS — M259 Joint disorder, unspecified: Secondary | ICD-10-CM | POA: Diagnosis not present

## 2019-10-10 DIAGNOSIS — L88 Pyoderma gangrenosum: Secondary | ICD-10-CM | POA: Diagnosis not present

## 2019-10-10 DIAGNOSIS — I10 Essential (primary) hypertension: Secondary | ICD-10-CM | POA: Diagnosis not present

## 2019-10-10 DIAGNOSIS — L97212 Non-pressure chronic ulcer of right calf with fat layer exposed: Secondary | ICD-10-CM | POA: Diagnosis not present

## 2019-10-11 DIAGNOSIS — M48061 Spinal stenosis, lumbar region without neurogenic claudication: Secondary | ICD-10-CM | POA: Diagnosis not present

## 2019-10-11 DIAGNOSIS — N183 Chronic kidney disease, stage 3 unspecified: Secondary | ICD-10-CM | POA: Diagnosis not present

## 2019-10-11 DIAGNOSIS — R1312 Dysphagia, oropharyngeal phase: Secondary | ICD-10-CM | POA: Diagnosis not present

## 2019-10-11 DIAGNOSIS — M0589 Other rheumatoid arthritis with rheumatoid factor of multiple sites: Secondary | ICD-10-CM | POA: Diagnosis not present

## 2019-10-11 DIAGNOSIS — I2510 Atherosclerotic heart disease of native coronary artery without angina pectoris: Secondary | ICD-10-CM | POA: Diagnosis not present

## 2019-10-11 DIAGNOSIS — I420 Dilated cardiomyopathy: Secondary | ICD-10-CM | POA: Diagnosis not present

## 2019-10-11 DIAGNOSIS — H538 Other visual disturbances: Secondary | ICD-10-CM | POA: Diagnosis not present

## 2019-10-11 DIAGNOSIS — I4892 Unspecified atrial flutter: Secondary | ICD-10-CM | POA: Diagnosis not present

## 2019-10-11 DIAGNOSIS — D631 Anemia in chronic kidney disease: Secondary | ICD-10-CM | POA: Diagnosis not present

## 2019-10-11 DIAGNOSIS — K5790 Diverticulosis of intestine, part unspecified, without perforation or abscess without bleeding: Secondary | ICD-10-CM | POA: Diagnosis not present

## 2019-10-11 DIAGNOSIS — I130 Hypertensive heart and chronic kidney disease with heart failure and stage 1 through stage 4 chronic kidney disease, or unspecified chronic kidney disease: Secondary | ICD-10-CM | POA: Diagnosis not present

## 2019-10-11 DIAGNOSIS — E1122 Type 2 diabetes mellitus with diabetic chronic kidney disease: Secondary | ICD-10-CM | POA: Diagnosis not present

## 2019-10-11 DIAGNOSIS — M797 Fibromyalgia: Secondary | ICD-10-CM | POA: Diagnosis not present

## 2019-10-11 DIAGNOSIS — I5042 Chronic combined systolic (congestive) and diastolic (congestive) heart failure: Secondary | ICD-10-CM | POA: Diagnosis not present

## 2019-10-11 DIAGNOSIS — E1151 Type 2 diabetes mellitus with diabetic peripheral angiopathy without gangrene: Secondary | ICD-10-CM | POA: Diagnosis not present

## 2019-10-11 DIAGNOSIS — I69398 Other sequelae of cerebral infarction: Secondary | ICD-10-CM | POA: Diagnosis not present

## 2019-10-11 DIAGNOSIS — G8929 Other chronic pain: Secondary | ICD-10-CM | POA: Diagnosis not present

## 2019-10-11 DIAGNOSIS — M1A9XX1 Chronic gout, unspecified, with tophus (tophi): Secondary | ICD-10-CM | POA: Diagnosis not present

## 2019-10-11 DIAGNOSIS — Z86718 Personal history of other venous thrombosis and embolism: Secondary | ICD-10-CM | POA: Diagnosis not present

## 2019-10-11 DIAGNOSIS — G931 Anoxic brain damage, not elsewhere classified: Secondary | ICD-10-CM | POA: Diagnosis not present

## 2019-10-11 DIAGNOSIS — M1990 Unspecified osteoarthritis, unspecified site: Secondary | ICD-10-CM | POA: Diagnosis not present

## 2019-10-11 DIAGNOSIS — M7501 Adhesive capsulitis of right shoulder: Secondary | ICD-10-CM | POA: Diagnosis not present

## 2019-10-16 ENCOUNTER — Ambulatory Visit: Payer: Self-pay

## 2019-10-16 NOTE — Telephone Encounter (Signed)
Daughter reports today pt.'s right foot is numb. Reports this new for pt. Per her daughter. Reports no changes in color of foot.No pain. Physical  Therapy is coming to see pt. Tomorrow or the next day. Will have them assess foot and call practice. Also wants to know if she needs to continue her Vit D3. Please advise pt.  Answer Assessment - Initial Assessment Questions 1. SYMPTOM: "What is the main symptom you are concerned about?" (e.g., weakness, numbness)     Right foot numbness 2. ONSET: "When did this start?" (minutes, hours, days; while sleeping)     7-6 weeks ago 3. LAST NORMAL: "When was the last time you were normal (no symptoms)?"     Months ago 4. PATTERN "Does this come and go, or has it been constant since it started?"  "Is it present now?"     Constant 5. CARDIAC SYMPTOMS: "Have you had any of the following symptoms: chest pain, difficulty breathing, palpitations?"     No 6. NEUROLOGIC SYMPTOMS: "Have you had any of the following symptoms: headache, dizziness, vision loss, double vision, changes in speech, unsteady on your feet?"     Non- ambulatory 7. OTHER SYMPTOMS: "Do you have any other symptoms?"     No 8. PREGNANCY: "Is there any chance you are pregnant?" "When was your last menstrual period?"     n/a  Protocols used: NEUROLOGIC DEFICIT-A-AH

## 2019-10-16 NOTE — Telephone Encounter (Signed)
MC-Plz see message below/thx dmf

## 2019-10-17 DIAGNOSIS — E1151 Type 2 diabetes mellitus with diabetic peripheral angiopathy without gangrene: Secondary | ICD-10-CM | POA: Diagnosis not present

## 2019-10-17 DIAGNOSIS — Z86718 Personal history of other venous thrombosis and embolism: Secondary | ICD-10-CM | POA: Diagnosis not present

## 2019-10-17 DIAGNOSIS — I5042 Chronic combined systolic (congestive) and diastolic (congestive) heart failure: Secondary | ICD-10-CM | POA: Diagnosis not present

## 2019-10-17 DIAGNOSIS — K5790 Diverticulosis of intestine, part unspecified, without perforation or abscess without bleeding: Secondary | ICD-10-CM | POA: Diagnosis not present

## 2019-10-17 DIAGNOSIS — M7501 Adhesive capsulitis of right shoulder: Secondary | ICD-10-CM | POA: Diagnosis not present

## 2019-10-17 DIAGNOSIS — I2510 Atherosclerotic heart disease of native coronary artery without angina pectoris: Secondary | ICD-10-CM | POA: Diagnosis not present

## 2019-10-17 DIAGNOSIS — N183 Chronic kidney disease, stage 3 unspecified: Secondary | ICD-10-CM | POA: Diagnosis not present

## 2019-10-17 DIAGNOSIS — I420 Dilated cardiomyopathy: Secondary | ICD-10-CM | POA: Diagnosis not present

## 2019-10-17 DIAGNOSIS — E1122 Type 2 diabetes mellitus with diabetic chronic kidney disease: Secondary | ICD-10-CM | POA: Diagnosis not present

## 2019-10-17 DIAGNOSIS — M0589 Other rheumatoid arthritis with rheumatoid factor of multiple sites: Secondary | ICD-10-CM | POA: Diagnosis not present

## 2019-10-17 DIAGNOSIS — D631 Anemia in chronic kidney disease: Secondary | ICD-10-CM | POA: Diagnosis not present

## 2019-10-17 DIAGNOSIS — M1990 Unspecified osteoarthritis, unspecified site: Secondary | ICD-10-CM | POA: Diagnosis not present

## 2019-10-17 DIAGNOSIS — M48061 Spinal stenosis, lumbar region without neurogenic claudication: Secondary | ICD-10-CM | POA: Diagnosis not present

## 2019-10-17 DIAGNOSIS — I130 Hypertensive heart and chronic kidney disease with heart failure and stage 1 through stage 4 chronic kidney disease, or unspecified chronic kidney disease: Secondary | ICD-10-CM | POA: Diagnosis not present

## 2019-10-17 DIAGNOSIS — G931 Anoxic brain damage, not elsewhere classified: Secondary | ICD-10-CM | POA: Diagnosis not present

## 2019-10-17 DIAGNOSIS — R1312 Dysphagia, oropharyngeal phase: Secondary | ICD-10-CM | POA: Diagnosis not present

## 2019-10-17 DIAGNOSIS — M1A9XX1 Chronic gout, unspecified, with tophus (tophi): Secondary | ICD-10-CM | POA: Diagnosis not present

## 2019-10-17 DIAGNOSIS — I69398 Other sequelae of cerebral infarction: Secondary | ICD-10-CM | POA: Diagnosis not present

## 2019-10-17 DIAGNOSIS — H538 Other visual disturbances: Secondary | ICD-10-CM | POA: Diagnosis not present

## 2019-10-17 DIAGNOSIS — G8929 Other chronic pain: Secondary | ICD-10-CM | POA: Diagnosis not present

## 2019-10-17 DIAGNOSIS — I4892 Unspecified atrial flutter: Secondary | ICD-10-CM | POA: Diagnosis not present

## 2019-10-17 DIAGNOSIS — M797 Fibromyalgia: Secondary | ICD-10-CM | POA: Diagnosis not present

## 2019-10-18 MED ORDER — D3-1000 25 MCG (1000 UT) PO CAPS: 1000.0000 [IU] | ORAL_CAPSULE | Freq: Every day | ORAL | 3 refills | Status: DC

## 2019-10-18 NOTE — Telephone Encounter (Signed)
Spoke with daughter(on DPR) and informed her of Dr. Lurline Del message. She understood and had no additional questions at this time.

## 2019-10-18 NOTE — Telephone Encounter (Signed)
Med refilled as requested

## 2019-10-18 NOTE — Telephone Encounter (Signed)
Dr. Loletha Grayer, I spoke with daughter and she states pt is doing better, she states PT came yesterday and it went well. She states Pt thinks the problems with her foot is coming from her back. She states it has not gotten worse but pretty much the same. She also states are you ok with sending in a rx for vitamin D3. (theres a rx on her list)

## 2019-10-18 NOTE — Addendum Note (Signed)
Addended by: Ronnald Nian on: 10/18/2019 11:40 AM   Modules accepted: Orders

## 2019-10-18 NOTE — Telephone Encounter (Signed)
Please call pts daughter to see what PT's assessment of pt was and overall how pt is doing with regard to Rt foot numbness.

## 2019-10-19 DIAGNOSIS — I420 Dilated cardiomyopathy: Secondary | ICD-10-CM | POA: Diagnosis not present

## 2019-10-19 DIAGNOSIS — M1A9XX1 Chronic gout, unspecified, with tophus (tophi): Secondary | ICD-10-CM | POA: Diagnosis not present

## 2019-10-19 DIAGNOSIS — Z86718 Personal history of other venous thrombosis and embolism: Secondary | ICD-10-CM | POA: Diagnosis not present

## 2019-10-19 DIAGNOSIS — D631 Anemia in chronic kidney disease: Secondary | ICD-10-CM | POA: Diagnosis not present

## 2019-10-19 DIAGNOSIS — N183 Chronic kidney disease, stage 3 unspecified: Secondary | ICD-10-CM | POA: Diagnosis not present

## 2019-10-19 DIAGNOSIS — M0589 Other rheumatoid arthritis with rheumatoid factor of multiple sites: Secondary | ICD-10-CM | POA: Diagnosis not present

## 2019-10-19 DIAGNOSIS — I4892 Unspecified atrial flutter: Secondary | ICD-10-CM | POA: Diagnosis not present

## 2019-10-19 DIAGNOSIS — M1990 Unspecified osteoarthritis, unspecified site: Secondary | ICD-10-CM | POA: Diagnosis not present

## 2019-10-19 DIAGNOSIS — R1312 Dysphagia, oropharyngeal phase: Secondary | ICD-10-CM | POA: Diagnosis not present

## 2019-10-19 DIAGNOSIS — M797 Fibromyalgia: Secondary | ICD-10-CM | POA: Diagnosis not present

## 2019-10-19 DIAGNOSIS — M7501 Adhesive capsulitis of right shoulder: Secondary | ICD-10-CM | POA: Diagnosis not present

## 2019-10-19 DIAGNOSIS — I2510 Atherosclerotic heart disease of native coronary artery without angina pectoris: Secondary | ICD-10-CM | POA: Diagnosis not present

## 2019-10-19 DIAGNOSIS — E1151 Type 2 diabetes mellitus with diabetic peripheral angiopathy without gangrene: Secondary | ICD-10-CM | POA: Diagnosis not present

## 2019-10-19 DIAGNOSIS — K5790 Diverticulosis of intestine, part unspecified, without perforation or abscess without bleeding: Secondary | ICD-10-CM | POA: Diagnosis not present

## 2019-10-19 DIAGNOSIS — E1122 Type 2 diabetes mellitus with diabetic chronic kidney disease: Secondary | ICD-10-CM | POA: Diagnosis not present

## 2019-10-19 DIAGNOSIS — G8929 Other chronic pain: Secondary | ICD-10-CM | POA: Diagnosis not present

## 2019-10-19 DIAGNOSIS — M48061 Spinal stenosis, lumbar region without neurogenic claudication: Secondary | ICD-10-CM | POA: Diagnosis not present

## 2019-10-19 DIAGNOSIS — I69398 Other sequelae of cerebral infarction: Secondary | ICD-10-CM | POA: Diagnosis not present

## 2019-10-19 DIAGNOSIS — I130 Hypertensive heart and chronic kidney disease with heart failure and stage 1 through stage 4 chronic kidney disease, or unspecified chronic kidney disease: Secondary | ICD-10-CM | POA: Diagnosis not present

## 2019-10-19 DIAGNOSIS — I5042 Chronic combined systolic (congestive) and diastolic (congestive) heart failure: Secondary | ICD-10-CM | POA: Diagnosis not present

## 2019-10-19 DIAGNOSIS — G931 Anoxic brain damage, not elsewhere classified: Secondary | ICD-10-CM | POA: Diagnosis not present

## 2019-10-19 DIAGNOSIS — H538 Other visual disturbances: Secondary | ICD-10-CM | POA: Diagnosis not present

## 2019-10-23 ENCOUNTER — Other Ambulatory Visit: Payer: Self-pay | Admitting: Family Medicine

## 2019-10-23 ENCOUNTER — Telehealth: Payer: Self-pay

## 2019-10-23 ENCOUNTER — Other Ambulatory Visit: Payer: Self-pay

## 2019-10-23 NOTE — Telephone Encounter (Signed)
Pt called in to make PCP aware that she is completely out and she is in pain.

## 2019-10-23 NOTE — Telephone Encounter (Signed)
Copied from [place] [id number]. Topic: General - Other >> Oct 23, 2019  9:16 AM Rainey Pines A wrote: Patients daughter [name] would like a callback from nurse in regards to how patient can be tested for covid due to her being bedridden. Please advise

## 2019-10-23 NOTE — Patient Outreach (Signed)
[facility]) Care Management  10/23/2019  [name] [name] Mar 24, 1930 [id number]   Incoming call from patient's daughter, [name].  Daughter calling to inquire about process for emergency placement for patient. Per daughter, patient has around the clock care between pcs and family members.  Family members providing care have tested positive for and/or have been exposed to [place].  Collaborated with CSW, [name], about potential options for patient. Called daughter back to discuss.  During this call daughter stated that aides providing pcs have agreed to assist during hours that family would typically provide care via private pay.  Ronn Melena, BSW Social Worker [phone]

## 2019-10-24 NOTE — Telephone Encounter (Signed)
I spoke with patient yesterday I informed her DR. C wouldn't be back in the office until Wednesday and we set up a virtual appt to discuss her concerns with DR.C. I did ask Dr.Matthews to advise with pt concerns but he rather Dr.C handle pt concerns.

## 2019-10-24 NOTE — Telephone Encounter (Signed)
Pt daughter called back and pushed appt to 3 and said that she was also wanting to know about what was going on with the pain medication tha she discussed with someone yesterday. Im not sure who she talked to so I just told her I would send a message

## 2019-10-25 ENCOUNTER — Encounter: Payer: Self-pay | Admitting: Family Medicine

## 2019-10-25 ENCOUNTER — Other Ambulatory Visit: Payer: Self-pay

## 2019-10-25 ENCOUNTER — Ambulatory Visit (INDEPENDENT_AMBULATORY_CARE_PROVIDER_SITE_OTHER): Payer: Medicare Other | Admitting: Family Medicine

## 2019-10-25 VITALS — Ht 64.0 in

## 2019-10-25 DIAGNOSIS — Z20822 Contact with and (suspected) exposure to covid-19: Secondary | ICD-10-CM

## 2019-10-25 DIAGNOSIS — Z20828 Contact with and (suspected) exposure to other viral communicable diseases: Secondary | ICD-10-CM | POA: Diagnosis not present

## 2019-10-25 NOTE — Telephone Encounter (Signed)
Not sure if home health nurse is able to do covid test, otherwise she would need an appt at [facility]. Daughter can text "COVID" to [phone] or call [phone] to schedule an appt.

## 2019-10-25 NOTE — Telephone Encounter (Signed)
Regarding covid her caregivers tested positive for covid.

## 2019-10-25 NOTE — Telephone Encounter (Signed)
Pt has VV today

## 2019-10-25 NOTE — Telephone Encounter (Signed)
I am not in the office on Mondays and Tuesdays. I sent refill of pts tramadol this AM.  Regarding COVID test, it would first be important to determine if pt needs to be tested - symptoms? Exposure to covid + individual?

## 2019-10-25 NOTE — Progress Notes (Signed)
Virtual Visit via Video Note  I connected with SHAMBHAVI SALLEY' daughter Marcie Bal on 10/25/19 at  3:00 PM EST by a video enabled telemedicine application and verified that I am speaking with the correct person using two identifiers. Location patient: home Location provider: work  Persons participating in the virtual visit: provider, pts daughter Marcie Bal   I discussed the limitations of evaluation and management by telemedicine and the availability of in person appointments. The patient expressed understanding and agreed to proceed.  Chief Complaint  Patient presents with  . Advice Only    Patients daughter Marcie Bal) calling with concerns becuse she is positive for COVID her mother the patient have been exposed, she is currently bed riddin and the aids that usually come out to the home are not able to come out due to [place] exposure. Daughter calling to see how she could get mother tested so someone could come and care for mother.      HPI: [name] [name] is a 85 y.o. female who lives at home but has home health aids that come to care for her. She is basically bed-bound at this point. Pts daughter Marcie Bal is COVID positive on Sun 12/13 and Marcie Bal sees and cares for the pt on a regular basis. HH is not able to come d/t COVID exposure. Pt was last with daughter 1 week ago, but granddaughters who are also COVID positive were with pt 4 days ago.  CNA reported to daughter Marcie Bal that pt with increased cough. No fever. No change in mental status. Daughter does not have any more info about pts current condition. She wants to know how to get her mother/the patient tested for COVID.   Past Medical History:  Diagnosis Date  . Allergic rhinitis due to pollen 04/27/2007  . Arthritis    "in q joint" (08/07/2013)  . CHF (congestive heart failure) ([place])   . Coronary atherosclerosis of native coronary artery   . Cough   . Edema 05/03/2013  . Exertional shortness of breath    "sometimes" (08/07/2013)  . First  degree atrioventricular block   . Gout, unspecified 04/19/2013  . Hyperlipidemia 04/27/2007  . Hypertension   . Midline low back pain without sciatica 06/27/2014  . Muscle spasm   . Muscle weakness (generalized)   . Onychia and paronychia of toe   . Osteoarthrosis, unspecified whether generalized or localized, unspecified site 04/27/2007  . Other fall   . Other malaise and fatigue   . Pneumonia 05/2018  . Prepatellar bursitis   . Pulmonary embolism ([place]) 07/2018  . Rheumatic nodule ([place]) 06/28/2017  . Seizures ([place])   . Stroke ([place]) 01/17/2014  . Type II diabetes mellitus ([place])    "fasting 90-110s" (08/07/2013)  . Unspecified vitamin D deficiency     Past Surgical History:  Procedure Laterality Date  . ABDOMINAL HYSTERECTOMY  04/1980  . APPENDECTOMY  1960  . CARDIAC CATHETERIZATION  2003  . CATARACT EXTRACTION W/ INTRAOCULAR LENS  IMPLANT, BILATERAL Bilateral ~ 2012  . CHOLECYSTECTOMY N/A 06/26/2018   Procedure: LAPAROSCOPIC CHOLECYSTECTOMY POSSIBLE INTRAOPERATIVE CHOLANGIOGRAM;  Surgeon: Coralie Keens, MD;  Location: [facility];  Service: General;  Laterality: N/A;  . JOINT REPLACEMENT    . REPLACEMENT TOTAL KNEE Right 09/2005  . RIGHT/LEFT HEART CATH AND CORONARY ANGIOGRAPHY N/A 06/01/2018   Procedure: RIGHT/LEFT HEART CATH AND CORONARY ANGIOGRAPHY;  Surgeon: Jettie Booze, MD;  Location: [facility];  Service: Cardiovascular;  Laterality: N/A;  . SHOULDER OPEN ROTATOR CUFF REPAIR Right  07/1999  . TONSILLECTOMY  08/1974  . TOTAL KNEE ARTHROPLASTY Left 08/07/2013   Procedure: TOTAL KNEE ARTHROPLASTY- LEFT;  Surgeon: Vickey Huger, MD;  Location: [facility];  Service: Orthopedics;  Laterality: Left;  . TRANSTHORACIC ECHOCARDIOGRAM  2003   EF 55-65%; mild concentric LVH    Family History  Problem Relation Age of Onset  . Heart attack Father     Social History   Tobacco Use  . Smoking status: Never Smoker  . Smokeless tobacco: Never Used  Substance Use Topics  .  Alcohol use: No    Alcohol/week: 0.0 standard drinks  . Drug use: No     Current Outpatient Medications:  .  albuterol (PROVENTIL HFA;VENTOLIN HFA) 108 (90 Base) MCG/ACT inhaler, Inhale 1-2 puffs into the lungs every 6 (six) hours as needed for wheezing or shortness of breath., Disp: 1 Inhaler, Rfl: 0 .  benzonatate (TESSALON) 100 MG capsule, Take 1 capsule (100 mg total) by mouth 2 (two) times daily as needed for cough., Disp: 20 capsule, Rfl: 0 .  Blood Glucose Monitoring Suppl (ACCU-CHEK AVIVA PLUS) w/Device KIT, Use to check blood sugars 1-2 times daily., Disp: 1 kit, Rfl: 0 .  carvedilol (COREG) 25 MG tablet, TAKE 1 TABLET BY MOUTH TWICE DAILY, Disp: 180 tablet, Rfl: 3 .  cephALEXin (KEFLEX) 500 MG capsule, Take 1 capsule (500 mg total) by mouth 2 (two) times daily., Disp: 14 capsule, Rfl: 0 .  D3-1000 25 MCG (1000 UT) capsule, Take 1 capsule (1,000 Units total) by mouth daily., Disp: 90 capsule, Rfl: 3 .  ENTRESTO 49-51 MG, TAKE 1 TABLET BY MOUTH EVERY DAY, Disp: 90 tablet, Rfl: 3 .  ferrous sulfate 325 (65 FE) MG tablet, Take 325 mg by mouth every other day., Disp: , Rfl:  .  furosemide (LASIX) 40 MG tablet, TAKE 1 TABLET BY MOUTH TWICE DAILY, Disp: 180 tablet, Rfl: 3 .  gabapentin (NEURONTIN) 100 MG capsule, TAKE 1 CAPSULE BY MOUTH THREE TIMES DAILY, Disp: 270 capsule, Rfl: 3 .  glucose blood (ACCU-CHEK AVIVA PLUS) test strip, Use to test blood sugars 1-2 times daily., Disp: 100 each, Rfl: 12 .  guaiFENesin-codeine (ROBITUSSIN AC) 100-10 MG/5ML syrup, Take 5 mLs by mouth 2 (two) times daily as needed for cough., Disp: 120 mL, Rfl: 0 .  hydrALAZINE (APRESOLINE) 25 MG tablet, Take 1 tablet (25 mg total) by mouth 3 (three) times daily., Disp: 270 tablet, Rfl: 1 .  hydroxychloroquine (PLAQUENIL) 200 MG tablet, Take 1 tablet (200 mg total) by mouth 2 (two) times daily., Disp: 60 tablet, Rfl: 0 .  hydroxypropyl methylcellulose / hypromellose (ISOPTO TEARS / GONIOVISC) 2.5 % ophthalmic  solution, Place 1 drop into both eyes daily., Disp: 15 mL, Rfl: 0 .  isosorbide mononitrate (IMDUR) 30 MG 24 hr tablet, TAKE 1 TABLET BY MOUTH EVERY DAY, Disp: 90 tablet, Rfl: 3 .  JANUVIA 25 MG tablet, TAKE 1/2 TABLET BY MOUTH(12,5 MG) EVERY DAY, Disp: 45 tablet, Rfl: 3 .  Lancets (ACCU-CHEK SOFT TOUCH) lancets, Use to check blood sugars 1-2 times daily., Disp: 100 each, Rfl: 12 .  leflunomide (ARAVA) 20 MG tablet, Take 1 tablet (20 mg total) by mouth daily., Disp: 30 tablet, Rfl: 0 .  Lidocaine (ASPERCREME LIDOCAINE) 4 % PTCH, Apply 1 Package topically daily. , Disp: , Rfl:  .  LORazepam (ATIVAN) 0.5 MG tablet, Take 0.25 mg by mouth as needed for anxiety. , Disp: , Rfl:  .  Melatonin 3 MG TABS, Take 6 mg by mouth at  bedtime. , Disp: , Rfl:  .  Olopatadine HCl 0.2 % SOLN, Place 1 drop into both eyes daily., Disp: 2.5 mL, Rfl: 0 .  potassium chloride SA (K-DUR) 20 MEQ tablet, Take 20 mEq by mouth See admin instructions. Monday and Thursday, Disp: , Rfl:  .  predniSONE (DELTASONE) 10 MG tablet, Take 10 mg by mouth daily with breakfast., Disp: , Rfl:  .  Probiotic CAPS, Take 1 capsule by mouth daily., Disp: 30 capsule, Rfl: 0 .  saccharomyces boulardii (FLORASTOR) 250 MG capsule, Take 250 mg by mouth every other day., Disp: , Rfl:  .  traMADol (ULTRAM) 50 MG tablet, TAKE 1 TO 2 TABLETS BY MOUTH EVERY 6 HOURS AS NEEDED FOR PAIN, Disp: 120 tablet, Rfl: 0 .  XARELTO 20 MG TABS tablet, TAKE 1 TABLET BY MOUTH EVERY DAY, Disp: 30 tablet, Rfl: 0 .  acetaminophen (TYLENOL) 500 MG tablet, Take 1,000 mg by mouth every 12 (twelve) hours. , Disp: , Rfl:  .  allopurinol (ZYLOPRIM) 100 MG tablet, TAKE 1 TABLET BY MOUTH EVERY DAY AT BEDTIME (Patient not taking: Reported on 10/25/2019), Disp: 90 tablet, Rfl: 3 .  docusate sodium (COLACE) 100 MG capsule, Take 1 capsule (100 mg total) by mouth daily as needed for mild constipation., Disp: 10 capsule, Rfl: 0 .  ENSURE (ENSURE), Take 237 mLs by mouth every evening.  (Patient not taking: Reported on 10/25/2019), Disp: 237 mL, Rfl: 12  Allergies  Allergen Reactions  . Clonidine Derivatives Swelling    [name]  . Fish Allergy Diarrhea, Swelling and Other (See Comments)    Turns skin "black," but can tolerate white fish Salmon- Diarrhea  . Shellfish Allergy Hives  . Doxycycline Rash  . Indomethacin Other (See Comments)    Reaction not recalled by the patient  . Lyrica [Pregabalin] Other (See Comments)    Hallucinations   . Methyldopa Other (See Comments)    Aldomet (for hypertension): Reaction not recalled by the patient  . Morphine And Related Other (See Comments)    Family reports it drops her bp that she needs iv fluids  . Orange Fruit [Citrus] Other (See Comments)    Indigestion/heartburn  . Strawberry (Diagnostic) Itching  . Cetirizine Hcl Itching and Rash  . Codeine Itching  . Levaquin [Levofloxacin In D5w] Rash  . Tomato Rash      ROS: See pertinent positives and negatives per HPI.   EXAM:  VITALS per patient if applicable: Ht 5' 4"  (1.626 m)   BMI 30.90 kg/m   Virtual appt done with pts daughter who is COVID + and therefore not with pt. No PE able to be done  ASSESSMENT AND PLAN: 1. Close exposure to COVID-19 virus - daughter who cares for pt is COVID positive, as are 2 grandchildren who were with pt 2 days ago - pt is unable to leave the home to be tested at testing site and home health is not coming at this time d/t pts COVID exposure - gave daughter info about pixel by [facility] ([email or web address]) as well as outpatient testing via appt at [facility] - daughter reports pt with increased cough x 1 day and daughter feels pt needs to be evaluated in person and tested for COVID today. She plans to call pts insurance or if necessary 911 to have pt taken to [place] for eval and COVID testing in ER there.    I discussed the assessment and treatment plan with  the  patient. The patient was provided an opportunity to ask questions and all were answered. The patient agreed with the plan and demonstrated an understanding of the instructions.   The patient was advised to call back or seek an in-person evaluation if the symptoms worsen or if the condition fails to improve as anticipated.   Letta Median, DO

## 2019-10-26 ENCOUNTER — Emergency Department (HOSPITAL_BASED_OUTPATIENT_CLINIC_OR_DEPARTMENT_OTHER)
Admission: EM | Admit: 2019-10-26 | Discharge: 2019-10-26 | Disposition: A | Discharge status: Home / Self Care | Payer: Medicare Other | Attending: Emergency Medicine | Admitting: Emergency Medicine

## 2019-10-26 ENCOUNTER — Encounter (HOSPITAL_BASED_OUTPATIENT_CLINIC_OR_DEPARTMENT_OTHER): Payer: Self-pay

## 2019-10-26 ENCOUNTER — Emergency Department (HOSPITAL_BASED_OUTPATIENT_CLINIC_OR_DEPARTMENT_OTHER): Payer: Medicare Other

## 2019-10-26 ENCOUNTER — Other Ambulatory Visit: Payer: Self-pay

## 2019-10-26 DIAGNOSIS — E1122 Type 2 diabetes mellitus with diabetic chronic kidney disease: Secondary | ICD-10-CM | POA: Diagnosis not present

## 2019-10-26 DIAGNOSIS — Z96652 Presence of left artificial knee joint: Secondary | ICD-10-CM | POA: Insufficient documentation

## 2019-10-26 DIAGNOSIS — Z79899 Other long term (current) drug therapy: Secondary | ICD-10-CM | POA: Diagnosis not present

## 2019-10-26 DIAGNOSIS — Z7901 Long term (current) use of anticoagulants: Secondary | ICD-10-CM | POA: Diagnosis not present

## 2019-10-26 DIAGNOSIS — I2510 Atherosclerotic heart disease of native coronary artery without angina pectoris: Secondary | ICD-10-CM | POA: Diagnosis not present

## 2019-10-26 DIAGNOSIS — N183 Chronic kidney disease, stage 3 unspecified: Secondary | ICD-10-CM | POA: Diagnosis not present

## 2019-10-26 DIAGNOSIS — I5042 Chronic combined systolic (congestive) and diastolic (congestive) heart failure: Secondary | ICD-10-CM | POA: Diagnosis not present

## 2019-10-26 DIAGNOSIS — I130 Hypertensive heart and chronic kidney disease with heart failure and stage 1 through stage 4 chronic kidney disease, or unspecified chronic kidney disease: Secondary | ICD-10-CM | POA: Diagnosis not present

## 2019-10-26 DIAGNOSIS — Z96651 Presence of right artificial knee joint: Secondary | ICD-10-CM | POA: Diagnosis not present

## 2019-10-26 DIAGNOSIS — U071 COVID-19: Secondary | ICD-10-CM | POA: Diagnosis not present

## 2019-10-26 DIAGNOSIS — R05 Cough: Secondary | ICD-10-CM | POA: Diagnosis not present

## 2019-10-26 DIAGNOSIS — Z20822 Contact with and (suspected) exposure to covid-19: Secondary | ICD-10-CM

## 2019-10-26 DIAGNOSIS — I10 Essential (primary) hypertension: Secondary | ICD-10-CM

## 2019-10-26 DIAGNOSIS — R058 Other specified cough: Secondary | ICD-10-CM

## 2019-10-26 MED ORDER — HYDRALAZINE HCL 25 MG PO TABS
25.0000 mg | ORAL_TABLET | Freq: Once | ORAL | Status: AC
  Administered 2019-10-26: 25 mg via ORAL
  Filled 2019-10-26: qty 1

## 2019-10-26 NOTE — ED Triage Notes (Signed)
Pt states that she has had a cough for a couple days with some weakness this morning. Pt states that she has been around her granddaughter who is Covid positive.

## 2019-10-26 NOTE — Discharge Instructions (Addendum)
Your x-ray today did not show pneumonia.  Blood pressure was slightly elevated today.  Follow-up with your primary care doctor and to have that rechecked.  Your oxygen level was normal.  A Covid test was sent today and that should be available within 24 hours.  Your results will be available on mychart

## 2019-10-26 NOTE — ED Provider Notes (Signed)
[name] Provider Note   CSN: [id number] Arrival date & time: 10/26/19  1432     History Chief Complaint  Patient presents with  . Fatigue  . Cough    [name] is a 85 y.o. female.  HPI   Patient presents to the emergency room for evaluation of possible Covid exposure.  Patient states she has had a slight cough in the mornings but otherwise is not having any symptoms.  She is not feeling short of breath.  She is not having any fevers.  She denies any nasal congestions or body aches.  The patient is wheelchair bound.  Patient has caregivers that aid her and her daughter also checks on her on a regular basis.  [name] tested positive on Sunday, December 13.  Because of the patient's exposure home health has not been able to come to the patient's house per the regulations.  [name] called the patient's primary care doctor yesterday regarding trying to get the patient tested.  She was offered options of a couple of outpatient testing centers.  Daughter was concerned about the patient's cough and so the patient was brought here to the emergency room to be evaluated.  Past Medical History:  Diagnosis Date  . Allergic rhinitis due to pollen 04/27/2007  . Arthritis    "in q joint" (08/07/2013)  . CHF (congestive heart failure) ([place])   . Coronary atherosclerosis of native coronary artery   . Cough   . Edema 05/03/2013  . Exertional shortness of breath    "sometimes" (08/07/2013)  . First degree atrioventricular block   . Gout, unspecified 04/19/2013  . Hyperlipidemia 04/27/2007  . Hypertension   . Midline low back pain without sciatica 06/27/2014  . Muscle spasm   . Muscle weakness (generalized)   . Onychia and paronychia of toe   . Osteoarthrosis, unspecified whether generalized or localized, unspecified site 04/27/2007  . Other fall   . Other malaise and fatigue   . Pneumonia 05/2018  . Prepatellar bursitis   .  Pulmonary embolism ([place]) 07/2018  . Rheumatic nodule ([place]) 06/28/2017  . Seizures ([place])   . Stroke ([place]) 01/17/2014  . Type II diabetes mellitus ([place])    "fasting 90-110s" (08/07/2013)  . Unspecified vitamin D deficiency     Patient Active Problem List   Diagnosis Date Noted  . Pre-operative clearance 05/29/2019  . Volume overload 05/29/2019  . Acute metabolic encephalopathy 15/03/6978  . Pressure injury of skin 01/12/2019  . AMS (altered mental status) 01/09/2019  . Rheumatoid arthritis flare ([place]) 12/23/2018  . Weakness of right lower extremity 12/23/2018  . Abscess of right upper extremity 12/14/2018  . Fever 12/13/2018  . Hand swelling 12/02/2018  . Pain, wrist 12/02/2018  . Hand pain 12/02/2018  . Polyarthritis 12/02/2018  . Spinal stenosis, lumbar 11/19/2018  . Acute encephalopathy 11/19/2018  . History of CVA (cerebrovascular accident) 11/19/2018  . Acute gout 11/15/2018  . Low back pain 11/14/2018  . Chronic back pain 10/27/2018  . Arthralgia of both knees 10/27/2018  . Rheumatoid arthritis involving both knees ([place]) 10/27/2018  . Acute pain 10/21/2018  . Physical deconditioning 09/06/2018  . Hyperlipidemia associated with type 2 diabetes mellitus ([place]), refuses statin 09/06/2018  . Refusal of statin medication by patient 09/04/2018  . Deep vein thrombosis (DVT) of distal vein of right lower extremity ([place]) 07/30/2018  . Hypertensive heart disease with heart failure ([place]) 07/30/2018  . Pulmonary embolism ([place]), 07/2018, on Xarelto with  goal to DC after 6 months if more active 07/10/2018  . Elevated brain natriuretic peptide (BNP) level   . DCM (dilated cardiomyopathy) ([place])   . Benign hypertensive heart and kidney disease with CHF and stage 3 chronic kidney disease ([place]) 05/28/2018  . Osteoarthritis 05/06/2018  . Hypokalemia 02/02/2018  . Adhesive capsulitis of right shoulder 10/12/2017  . Idiopathic chronic gout of multiple sites with tophus 08/25/2017  . History  of total knee arthroplasty, bilateral 02/18/2017  . History of rotator cuff surgery 02/18/2017  . High risk medications, long-term use 09/21/2016  . Chronic combined systolic and diastolic CHF (congestive heart failure) ([place]) 09/12/2015  . Bilateral edema of lower extremity 06/05/2014  . CKD stage 3 due to type 2 diabetes mellitus ([place]) 06/05/2014  . DM (diabetes mellitus), type 2 with renal complications ([place]) 55/37/4827    Class: Chronic  . Anemia of chronic disease 05/01/2014  . CAD (coronary artery disease) 10/18/2013  . Hypertension associated with diabetes ([place]) 08/08/2013    Class: Chronic  . Fibromyalgia 05/10/2013  . Type 2 diabetes mellitus with hyperlipidemia ([place]), patient declines statin 04/27/2007  . Allergic rhinitis 04/27/2007  . Diverticulosis 04/27/2007    Past Surgical History:  Procedure Laterality Date  . ABDOMINAL HYSTERECTOMY  04/1980  . APPENDECTOMY  1960  . CARDIAC CATHETERIZATION  2003  . CATARACT EXTRACTION W/ INTRAOCULAR LENS  IMPLANT, BILATERAL Bilateral ~ 2012  . CHOLECYSTECTOMY N/A 06/26/2018   Procedure: LAPAROSCOPIC CHOLECYSTECTOMY POSSIBLE INTRAOPERATIVE CHOLANGIOGRAM;  Surgeon: Coralie Keens, MD;  Location: [facility];  Service: General;  Laterality: N/A;  . JOINT REPLACEMENT    . REPLACEMENT TOTAL KNEE Right 09/2005  . RIGHT/LEFT HEART CATH AND CORONARY ANGIOGRAPHY N/A 06/01/2018   Procedure: RIGHT/LEFT HEART CATH AND CORONARY ANGIOGRAPHY;  Surgeon: Jettie Booze, MD;  Location: [facility];  Service: Cardiovascular;  Laterality: N/A;  . SHOULDER OPEN ROTATOR CUFF REPAIR Right 07/1999  . TONSILLECTOMY  08/1974  . TOTAL KNEE ARTHROPLASTY Left 08/07/2013   Procedure: TOTAL KNEE ARTHROPLASTY- LEFT;  Surgeon: Vickey Huger, MD;  Location: [facility];  Service: Orthopedics;  Laterality: Left;  . TRANSTHORACIC ECHOCARDIOGRAM  2003   EF 55-65%; mild concentric LVH     OB History   No obstetric history on file.     Family History  Problem  Relation Age of Onset  . Heart attack Father     Social History   Tobacco Use  . Smoking status: Never Smoker  . Smokeless tobacco: Never Used  Substance Use Topics  . Alcohol use: No    Alcohol/week: 0.0 standard drinks  . Drug use: No    Home Medications Prior to Admission medications   Medication Sig Start Date End Date Taking? Authorizing Provider  acetaminophen (TYLENOL) 500 MG tablet Take 1,000 mg by mouth every 12 (twelve) hours.     [provider]  albuterol (PROVENTIL HFA;VENTOLIN HFA) 108 (90 Base) MCG/ACT inhaler Inhale 1-2 puffs into the lungs every 6 (six) hours as needed for wheezing or shortness of breath. 12/20/18   Desiree Hane, MD  allopurinol (ZYLOPRIM) 100 MG tablet TAKE 1 TABLET BY MOUTH EVERY DAY AT BEDTIME Patient not taking: Reported on 10/25/2019 09/21/19   Ronnald Nian, DO  benzonatate (TESSALON) 100 MG capsule Take 1 capsule (100 mg total) by mouth 2 (two) times daily as needed for cough. 08/29/19   Luetta Nutting, DO  Blood Glucose Monitoring Suppl (ACCU-CHEK AVIVA PLUS) w/Device KIT Use to check blood sugars 1-2 times daily.  09/06/19   Cirigliano, Mary K, DO  carvedilol (COREG) 25 MG tablet TAKE 1 TABLET BY MOUTH TWICE DAILY 09/21/19   Cirigliano, Mary K, DO  cephALEXin (KEFLEX) 500 MG capsule Take 1 capsule (500 mg total) by mouth 2 (two) times daily. 09/25/19   Ward, Kristen N, DO  D3-1000 25 MCG (1000 UT) capsule Take 1 capsule (1,000 Units total) by mouth daily. 10/18/19   Cirigliano, Garvin Fila, DO  docusate sodium (COLACE) 100 MG capsule Take 1 capsule (100 mg total) by mouth daily as needed for mild constipation. 12/20/18   Desiree Hane, MD  ENSURE (ENSURE) Take 237 mLs by mouth every evening. Patient not taking: Reported on 10/25/2019 12/20/18   Desiree Hane, MD  ENTRESTO 49-51 MG TAKE 1 TABLET BY MOUTH EVERY DAY 09/21/19   Cirigliano, [place] Kidney K, DO  ferrous sulfate 325 (65 FE) MG tablet Take 325 mg by mouth every other day.     [provider]  furosemide (LASIX) 40 MG tablet TAKE 1 TABLET BY MOUTH TWICE DAILY 09/21/19   Cirigliano, Mary K, DO  gabapentin (NEURONTIN) 100 MG capsule TAKE 1 CAPSULE BY MOUTH THREE TIMES DAILY 09/21/19   Cirigliano, Mary K, DO  glucose blood (ACCU-CHEK AVIVA PLUS) test strip Use to test blood sugars 1-2 times daily. 09/06/19   Cirigliano, Mary K, DO  guaiFENesin-codeine (ROBITUSSIN AC) 100-10 MG/5ML syrup Take 5 mLs by mouth 2 (two) times daily as needed for cough. 09/13/19   Virgel Manifold, MD  hydrALAZINE (APRESOLINE) 25 MG tablet Take 1 tablet (25 mg total) by mouth 3 (three) times daily. 09/28/19   Skeet Latch, MD  hydroxychloroquine (PLAQUENIL) 200 MG tablet Take 1 tablet (200 mg total) by mouth 2 (two) times daily. 12/20/18   Desiree Hane, MD  hydroxypropyl methylcellulose / hypromellose (ISOPTO TEARS / GONIOVISC) 2.5 % ophthalmic solution Place 1 drop into both eyes daily. 12/20/18   Oretha Milch D, MD  isosorbide mononitrate (IMDUR) 30 MG 24 hr tablet TAKE 1 TABLET BY MOUTH EVERY DAY 09/21/19   Cirigliano, Mary K, DO  JANUVIA 25 MG tablet TAKE 1/2 TABLET BY MOUTH(12,5 MG) EVERY DAY 09/21/19   Cirigliano, Garvin Fila, DO  Lancets (ACCU-CHEK SOFT TOUCH) lancets Use to check blood sugars 1-2 times daily. 09/06/19   Cirigliano, Garvin Fila, DO  leflunomide (ARAVA) 20 MG tablet Take 1 tablet (20 mg total) by mouth daily. 12/20/18   Oretha Milch D, MD  Lidocaine (ASPERCREME LIDOCAINE) 4 % PTCH Apply 1 Package topically daily.     [provider]  LORazepam (ATIVAN) 0.5 MG tablet Take 0.25 mg by mouth as needed for anxiety.     [provider]  Melatonin 3 MG TABS Take 6 mg by mouth at bedtime.     [provider]  Olopatadine HCl 0.2 % SOLN Place 1 drop into both eyes daily. 12/20/18   Oretha Milch D, MD  potassium chloride SA (K-DUR) 20 MEQ tablet Take 20 mEq by mouth See admin instructions. Monday and Thursday    [provider]  predniSONE  (DELTASONE) 10 MG tablet Take 10 mg by mouth daily with breakfast.    [provider]  Probiotic CAPS Take 1 capsule by mouth daily. 09/25/19   Ward, Delice Bison, DO  saccharomyces boulardii (FLORASTOR) 250 MG capsule Take 250 mg by mouth every other day.    [provider]  traMADol (ULTRAM) 50 MG tablet TAKE 1 TO 2 TABLETS BY MOUTH EVERY 6 HOURS AS NEEDED  FOR PAIN 10/25/19   Cirigliano, Mary K, DO  XARELTO 20 MG TABS tablet TAKE 1 TABLET BY MOUTH EVERY DAY 10/03/19   Cirigliano, Garvin Fila, DO    Allergies    Clonidine derivatives, Fish allergy, Shellfish allergy, Doxycycline, Indomethacin, Lyrica [pregabalin], Methyldopa, Morphine and related, Orange fruit [citrus], Strawberry (diagnostic), Cetirizine hcl, Codeine, Levaquin [levofloxacin in d5w], and Tomato  Review of Systems   Review of Systems  All other systems reviewed and are negative.   Physical Exam Updated Vital Signs BP (!) 155/130 (BP Location: Left Arm)   Pulse 87   Temp 100 F (37.8 C) (Oral)   Resp 18   Ht 1.626 m (_0 )   Wt 81.6 kg   SpO2 97%   BMI 30.90 kg/m   Physical Exam Vitals and nursing note reviewed.  Constitutional:      General: She is not in acute distress.    Appearance: She is well-developed.  HENT:     Head: Normocephalic and atraumatic.     Right Ear: External ear normal.     Left Ear: External ear normal.  Eyes:     General: No scleral icterus.       Right eye: No discharge.        Left eye: No discharge.     Conjunctiva/sclera: Conjunctivae normal.  Neck:     Trachea: No tracheal deviation.  Cardiovascular:     Rate and Rhythm: Normal rate and regular rhythm.  Pulmonary:     Effort: Pulmonary effort is normal. No respiratory distress.     Breath sounds: Normal breath sounds. No stridor. No wheezing or rales.  Abdominal:     General: Bowel sounds are normal. There is no distension.     Palpations: Abdomen is soft.     Tenderness: There is no abdominal tenderness. There  is no guarding or rebound.  Musculoskeletal:        General: No swelling, deformity or signs of injury.     Cervical back: Neck supple.  Skin:    General: Skin is warm and dry.     Findings: No rash.  Neurological:     Mental Status: She is alert.     Cranial Nerves: No cranial nerve deficit (no facial droop, extraocular movements intact, no slurred speech).     Sensory: No sensory deficit.     Motor: No abnormal muscle tone or seizure activity.     Coordination: Coordination normal.     Comments: Weakness in the lower extremities, patient is able to sit up in bed however without assistance, she has difficulty moving her right leg, she is able to move her left leg more easily.  Good grip strength bilaterally.  Normal speech.     ED Results / Procedures / Treatments   Labs (all labs ordered are listed, but only abnormal results are displayed) Labs Reviewed  SARS CORONAVIRUS 2 (TAT 6-24 HRS)    EKG None  Radiology DG Chest Portable 1 View  Result Date: 10/26/2019 CLINICAL DATA:  Cough. Pt states that she has had a cough for a couple days with some weakness this morning. Pt states that she has been around her granddaughter who is Covid positive. HX: Stroke, PE, HTN, CHF, Diabetes, EXAM: PORTABLE CHEST 1 VIEW COMPARISON:  09/24/2019 FINDINGS: Cardiac silhouette is mildly enlarged. No mediastinal or hilar masses. Clear lungs.  No pleural effusion or pneumothorax. Skeletal structures are grossly intact. IMPRESSION: No acute cardiopulmonary disease. Electronically Signed   By: Shanon Brow  Ormond [name]   On: 10/26/2019 16:29    Procedures Procedures (including critical care time)  Medications Ordered in ED Medications  hydrALAZINE (APRESOLINE) tablet 25 mg (has no administration in time range)    ED Course  I have reviewed the triage vital signs and the nursing notes.  Pertinent labs & imaging results that were available during my care of the patient were reviewed by me and considered  in my medical decision making (see chart for details).  Clinical Course as of Oct 26 1651  Thu Oct 26, 2019  1638 Chest x-ray without pneumonia.   [JK]    Clinical Course User Index [JK] Dorie Rank, MD   MDM Rules/Calculators/A&P                      Patient presented for evaluation of COVID-19 exposure.  [name] also had a mild cough.  In the ED she appeared well.  She has not been coughing here in the ED.  [name] oxygenation is normal and she is not tachypneic.  Her lungs are clear.  Chest x-ray does not show pneumonia.  No signs to suggest any serious COVID-19 infection.  Patient is hypertensive.  She is on several medications at home.  I have given her an additional dose of hydralazine and recommend she follow-up with her primary care doctor to monitor that.  Patient will have a COVID-19 test sent off.  She is stable for outpatient management.  AMORA SHEEHY was evaluated in Emergency Department on 10/26/2019 for the symptoms described in the history of present illness. She was evaluated in the context of the global COVID-19 pandemic, which necessitated consideration that the patient might be at risk for infection with the SARS-CoV-2 virus that causes COVID-19. Institutional protocols and algorithms that pertain to the evaluation of patients at risk for COVID-19 are in a state of rapid change based on information released by regulatory bodies including the CDC and federal and state organizations. These policies and algorithms were followed during the patient's care in the ED.  Final Clinical Impression(s) / ED Diagnoses Final diagnoses:  Cough with exposure to COVID-19 virus  Hypertension, unspecified type    Rx / DC Orders ED Discharge Orders    None       Dorie Rank, MD 10/26/19 [phone]

## 2019-10-26 NOTE — ED Notes (Signed)
Spoke with Excell Seltzer, patient's daughter, and informed her that patient is being discharged.

## 2019-10-26 NOTE — ED Notes (Signed)
ED Provider at bedside. 

## 2019-10-27 ENCOUNTER — Encounter: Payer: Self-pay | Admitting: Family Medicine

## 2019-10-27 LAB — SARS CORONAVIRUS 2 (TAT 6-24 HRS): SARS Coronavirus 2: POSITIVE — AB

## 2019-10-27 NOTE — Telephone Encounter (Signed)
I spoke with patient's daughter Excell Seltzer who was concerned about patient's status after recently testing positive for Covid-19. Whole family - patient and daughter and grand daughters have tested positive for covid. Pt evaluated yesterday at ER, note reviewed: overall stable eval with good O2 sat, T 100, hypertensive, CXR normal, labs not drawn. She was tested mainly due to recent exposure not necessarily due to new or worsening symptoms. Sent home for outpt management.   Patient [name] is currently in her apartment, is bed-bound, other daughter from [place] [facility] drove up to help take care of her tonight. Home health agency saying they can't come out to the house of a positive covid patient. Daughter states patient has increased cough but no other change in status besides her normal chronic medical conditions. Reviewed reasons to seek ER care over weekend especially if worsening dyspnea at rest or signs of hypoxia. Emphasized if any worsening status she should immediately seek care at ER, via 911.   Daughter Jeannette's first day of symptoms was 10/19/2019. Daughter did have infusion and was told could end self quarantine on Sunday. Daughter is overall improving. Given both patient and daughter are positive for covid, I don't see need for daughter to isolate herself from her mother as long as she feels well enough to provide care, I did suggest she wear mask when around mother if able. Advised I'm on call all weekend and to call me back if any questions or concerns.   Will cc PCP as fyi.

## 2019-10-28 ENCOUNTER — Telehealth: Payer: Self-pay | Admitting: Infectious Diseases

## 2019-10-28 ENCOUNTER — Telehealth: Payer: Self-pay | Admitting: Unknown Physician Specialty

## 2019-10-28 NOTE — Telephone Encounter (Signed)
Called patient and daughter to discuss monoclonal antibody infusion candidacy. LVM regarding call back.   Will send MyChart as well to try to get in touch. The patient tested positive 12/17 and had exposure 12/13 from her daughter. She is a candidate for infusion as long as symptom onset for her is not beyond 10 days.    Janene Madeira, MSN, NP-C [facility] [email or web address] Pager: [phone] Office: [phone] [place]: [phone]

## 2019-10-28 NOTE — Telephone Encounter (Signed)
Discussed with patient about Covid symptoms and the use of bamlanivimab, a monoclonal antibody infusion for those with mild to moderate Covid symptoms and at a high risk of hospitalization.  Pt is not qualified for this infusion due to symptoms greater than 10 days 

## 2019-10-29 ENCOUNTER — Encounter (HOSPITAL_COMMUNITY): Payer: Self-pay

## 2019-10-29 ENCOUNTER — Emergency Department (HOSPITAL_COMMUNITY)
Admission: EM | Admit: 2019-10-29 | Discharge: 2019-11-01 | Disposition: A | Discharge status: Home / Self Care | Payer: Medicare Other | Attending: Emergency Medicine | Admitting: Emergency Medicine

## 2019-10-29 ENCOUNTER — Other Ambulatory Visit: Payer: Self-pay

## 2019-10-29 ENCOUNTER — Emergency Department (HOSPITAL_COMMUNITY): Payer: Medicare Other

## 2019-10-29 DIAGNOSIS — R079 Chest pain, unspecified: Secondary | ICD-10-CM | POA: Diagnosis not present

## 2019-10-29 DIAGNOSIS — Z86711 Personal history of pulmonary embolism: Secondary | ICD-10-CM | POA: Diagnosis not present

## 2019-10-29 DIAGNOSIS — R0789 Other chest pain: Secondary | ICD-10-CM | POA: Diagnosis not present

## 2019-10-29 DIAGNOSIS — I130 Hypertensive heart and chronic kidney disease with heart failure and stage 1 through stage 4 chronic kidney disease, or unspecified chronic kidney disease: Secondary | ICD-10-CM | POA: Diagnosis not present

## 2019-10-29 DIAGNOSIS — N183 Chronic kidney disease, stage 3 unspecified: Secondary | ICD-10-CM | POA: Diagnosis not present

## 2019-10-29 DIAGNOSIS — Z79899 Other long term (current) drug therapy: Secondary | ICD-10-CM | POA: Diagnosis not present

## 2019-10-29 DIAGNOSIS — Z86718 Personal history of other venous thrombosis and embolism: Secondary | ICD-10-CM | POA: Insufficient documentation

## 2019-10-29 DIAGNOSIS — Z7901 Long term (current) use of anticoagulants: Secondary | ICD-10-CM | POA: Insufficient documentation

## 2019-10-29 DIAGNOSIS — M6281 Muscle weakness (generalized): Secondary | ICD-10-CM | POA: Insufficient documentation

## 2019-10-29 DIAGNOSIS — U071 COVID-19: Secondary | ICD-10-CM | POA: Diagnosis not present

## 2019-10-29 DIAGNOSIS — I5042 Chronic combined systolic (congestive) and diastolic (congestive) heart failure: Secondary | ICD-10-CM | POA: Diagnosis not present

## 2019-10-29 DIAGNOSIS — M069 Rheumatoid arthritis, unspecified: Secondary | ICD-10-CM | POA: Insufficient documentation

## 2019-10-29 DIAGNOSIS — I10 Essential (primary) hypertension: Secondary | ICD-10-CM | POA: Diagnosis not present

## 2019-10-29 DIAGNOSIS — Z743 Need for continuous supervision: Secondary | ICD-10-CM | POA: Diagnosis not present

## 2019-10-29 DIAGNOSIS — E1122 Type 2 diabetes mellitus with diabetic chronic kidney disease: Secondary | ICD-10-CM | POA: Diagnosis not present

## 2019-10-29 LAB — TROPONIN I (HIGH SENSITIVITY): Troponin I (High Sensitivity): 11 ng/L (ref <18)

## 2019-10-29 LAB — CBG MONITORING, ED: Glucose-Capillary: 97 mg/dL (ref 70–99)

## 2019-10-29 LAB — COMPREHENSIVE METABOLIC PANEL
ALT: 11 U/L (ref 0–44)
AST: 17 U/L (ref 15–41)
Albumin: 2.9 g/dL — ABNORMAL LOW (ref 3.5–5.0)
Alkaline Phosphatase: 49 U/L (ref 38–126)
Anion gap: 8 (ref 5–15)
BUN: 35 mg/dL — ABNORMAL HIGH (ref 8–23)
CO2: 26 mmol/L (ref 22–32)
Calcium: 8.3 mg/dL — ABNORMAL LOW (ref 8.9–10.3)
Chloride: 104 mmol/L (ref 98–111)
Creatinine, Ser: 1.05 mg/dL — ABNORMAL HIGH (ref 0.44–1.00)
GFR calc Af Amer: 55 mL/min — ABNORMAL LOW (ref >60)
GFR calc non Af Amer: 48 mL/min — ABNORMAL LOW (ref >60)
Glucose, Bld: 107 mg/dL — ABNORMAL HIGH (ref 70–99)
Potassium: 3.7 mmol/L (ref 3.5–5.1)
Sodium: 138 mmol/L (ref 135–145)
Total Bilirubin: 0.1 mg/dL — ABNORMAL LOW (ref 0.3–1.2)
Total Protein: 6 g/dL — ABNORMAL LOW (ref 6.5–8.1)

## 2019-10-29 LAB — CBC
HCT: 28.9 % — ABNORMAL LOW (ref 36.0–46.0)
Hemoglobin: 9.2 g/dL — ABNORMAL LOW (ref 12.0–15.0)
MCH: 32.2 pg (ref 26.0–34.0)
MCHC: 31.8 g/dL (ref 30.0–36.0)
MCV: 101 fL — ABNORMAL HIGH (ref 80.0–100.0)
Platelets: 234 10*3/uL (ref 150–400)
RBC: 2.86 MIL/uL — ABNORMAL LOW (ref 3.87–5.11)
RDW: 15.5 % (ref 11.5–15.5)
WBC: 4.2 10*3/uL (ref 4.0–10.5)
nRBC: 0 % (ref 0.0–0.2)

## 2019-10-29 NOTE — ED Triage Notes (Signed)
Pt BIB [facility] from home C/O of chest pain with a cough since 1pm. Pt tested positive for COVID on Friday.

## 2019-10-29 NOTE — ED Provider Notes (Signed)
[facility] EMERGENCY DEPARTMENT Provider Note   CSN: [id number] Arrival date & time: 10/29/19  2157     History Chief Complaint  Patient presents with  . Cough  . Chest Pain    [name] [name] is a 85 y.o. female with a history of diabetes mellitus type 2, CKD stage III, CAD, chronic combined systolic and diastolic heart failure, chronic gout, DVT and PE on Xarelto, CVA, rheumatoid arthritis on Plaquenil who presents to the emergency department with a chief complaint of chest pain.  The patient tested positive for COVID-19 on 12/17. Reports she was exposed on 12/12 by her granddaughter who had an asymptomatic Covid diagnosis.  She reports that she has been having a slight cough that worsened today.  She has been intermittently coughing up clear sputum.  She reports associated chest pain accompanying the cough that started this afternoon.  She is unable to characterize the pain.  She reports that the pain resolves with coughing, but she has noticed a couple of episodes of pain today that were not associated with coughing.   She is not having any shortness of breath, fever, chills, loss of sense of taste or smell, weakness, numbness, confusion, nausea, vomiting, diarrhea, or abdominal pain.  Spoke with the patient's daughter, [name] [name].  She reports that her mother lives alone, but she lives nearby.  She does not walk and requires home health and the assistance of a lift to get in and out of her chair and her bed at home.  She reports that home health stopped coming to the patient's home after she was diagnosed with COVID-19.  Typically, [name] helps to care for her mother when home health is unavailable, but [name] also tested positive and has been feeling very unwell.  She has been unable to care for her mother the last few days.  Reports that she has been making calls all weekend trying to figure out a solution for her mother as she is concerned about her safety since  she lives alone.  The history is provided by the patient. No language interpreter was used.       Past Medical History:  Diagnosis Date  . Allergic rhinitis due to pollen 04/27/2007  . Arthritis    "in q joint" (08/07/2013)  . CHF (congestive heart failure) ([place])   . Coronary atherosclerosis of native coronary artery   . Cough   . Edema 05/03/2013  . Exertional shortness of breath    "sometimes" (08/07/2013)  . First degree atrioventricular block   . Gout, unspecified 04/19/2013  . Hyperlipidemia 04/27/2007  . Hypertension   . Midline low back pain without sciatica 06/27/2014  . Muscle spasm   . Muscle weakness (generalized)   . Onychia and paronychia of toe   . Osteoarthrosis, unspecified whether generalized or localized, unspecified site 04/27/2007  . Other fall   . Other malaise and fatigue   . Pneumonia 05/2018  . Prepatellar bursitis   . Pulmonary embolism ([place]) 07/2018  . Rheumatic nodule ([place]) 06/28/2017  . Seizures ([place])   . Stroke ([place]) 01/17/2014  . Type II diabetes mellitus ([place])    "fasting 90-110s" (08/07/2013)  . Unspecified vitamin D deficiency     Patient Active Problem List   Diagnosis Date Noted  . Pre-operative clearance 05/29/2019  . Volume overload 05/29/2019  . Acute metabolic encephalopathy 59/29/2446  . Pressure injury of skin 01/12/2019  . AMS (altered mental status) 01/09/2019  . Rheumatoid arthritis flare ([place]) 12/23/2018  .  Weakness of right lower extremity 12/23/2018  . Abscess of right upper extremity 12/14/2018  . Fever 12/13/2018  . Hand swelling 12/02/2018  . Pain, wrist 12/02/2018  . Hand pain 12/02/2018  . Polyarthritis 12/02/2018  . Spinal stenosis, lumbar 11/19/2018  . Acute encephalopathy 11/19/2018  . History of CVA (cerebrovascular accident) 11/19/2018  . Acute gout 11/15/2018  . Low back pain 11/14/2018  . Chronic back pain 10/27/2018  . Arthralgia of both knees 10/27/2018  . Rheumatoid arthritis involving both knees  ([place]) 10/27/2018  . Acute pain 10/21/2018  . Physical deconditioning 09/06/2018  . Hyperlipidemia associated with type 2 diabetes mellitus ([place]), refuses statin 09/06/2018  . Refusal of statin medication by patient 09/04/2018  . Deep vein thrombosis (DVT) of distal vein of right lower extremity ([place]) 07/30/2018  . Hypertensive heart disease with heart failure ([place]) 07/30/2018  . Pulmonary embolism ([place]), 07/2018, on Xarelto with goal to DC after 6 months if more active 07/10/2018  . Elevated brain natriuretic peptide (BNP) level   . DCM (dilated cardiomyopathy) ([place])   . Benign hypertensive heart and kidney disease with CHF and stage 3 chronic kidney disease ([place]) 05/28/2018  . Osteoarthritis 05/06/2018  . Hypokalemia 02/02/2018  . Adhesive capsulitis of right shoulder 10/12/2017  . Idiopathic chronic gout of multiple sites with tophus 08/25/2017  . History of total knee arthroplasty, bilateral 02/18/2017  . History of rotator cuff surgery 02/18/2017  . High risk medications, long-term use 09/21/2016  . Chronic combined systolic and diastolic CHF (congestive heart failure) ([place]) 09/12/2015  . Bilateral edema of lower extremity 06/05/2014  . CKD stage 3 due to type 2 diabetes mellitus ([place]) 06/05/2014  . DM (diabetes mellitus), type 2 with renal complications ([place]) 45/99/7741    Class: Chronic  . Anemia of chronic disease 05/01/2014  . CAD (coronary artery disease) 10/18/2013  . Hypertension associated with diabetes ([place]) 08/08/2013    Class: Chronic  . Fibromyalgia 05/10/2013  . Type 2 diabetes mellitus with hyperlipidemia ([place]), patient declines statin 04/27/2007  . Allergic rhinitis 04/27/2007  . Diverticulosis 04/27/2007    Past Surgical History:  Procedure Laterality Date  . ABDOMINAL HYSTERECTOMY  04/1980  . APPENDECTOMY  1960  . CARDIAC CATHETERIZATION  2003  . CATARACT EXTRACTION W/ INTRAOCULAR LENS  IMPLANT, BILATERAL Bilateral ~ 2012  . CHOLECYSTECTOMY N/A 06/26/2018    Procedure: LAPAROSCOPIC CHOLECYSTECTOMY POSSIBLE INTRAOPERATIVE CHOLANGIOGRAM;  Surgeon: Coralie Keens, MD;  Location: [facility];  Service: General;  Laterality: N/A;  . JOINT REPLACEMENT    . REPLACEMENT TOTAL KNEE Right 09/2005  . RIGHT/LEFT HEART CATH AND CORONARY ANGIOGRAPHY N/A 06/01/2018   Procedure: RIGHT/LEFT HEART CATH AND CORONARY ANGIOGRAPHY;  Surgeon: Jettie Booze, MD;  Location: [facility];  Service: Cardiovascular;  Laterality: N/A;  . SHOULDER OPEN ROTATOR CUFF REPAIR Right 07/1999  . TONSILLECTOMY  08/1974  . TOTAL KNEE ARTHROPLASTY Left 08/07/2013   Procedure: TOTAL KNEE ARTHROPLASTY- LEFT;  Surgeon: Vickey Huger, MD;  Location: [facility];  Service: Orthopedics;  Laterality: Left;  . TRANSTHORACIC ECHOCARDIOGRAM  2003   EF 55-65%; mild concentric LVH     OB History   No obstetric history on file.     Family History  Problem Relation Age of Onset  . Heart attack Father     Social History   Tobacco Use  . Smoking status: Never Smoker  . Smokeless tobacco: Never Used  Substance Use Topics  . Alcohol use: No    Alcohol/week: 0.0 standard drinks  .  Drug use: No    Home Medications Prior to Admission medications   Medication Sig Start Date End Date Taking? Authorizing Provider  acetaminophen (TYLENOL) 500 MG tablet Take 1,000 mg by mouth every 12 (twelve) hours.     [provider]  albuterol (PROVENTIL HFA;VENTOLIN HFA) 108 (90 Base) MCG/ACT inhaler Inhale 1-2 puffs into the lungs every 6 (six) hours as needed for wheezing or shortness of breath. 12/20/18   Desiree Hane, MD  allopurinol (ZYLOPRIM) 100 MG tablet TAKE 1 TABLET BY MOUTH EVERY DAY AT BEDTIME Patient not taking: Reported on 10/25/2019 09/21/19   Ronnald Nian, DO  benzonatate (TESSALON) 100 MG capsule Take 1 capsule (100 mg total) by mouth 2 (two) times daily as needed for cough. 08/29/19   Luetta Nutting, DO  Blood Glucose Monitoring Suppl (ACCU-CHEK AVIVA PLUS) w/Device KIT  Use to check blood sugars 1-2 times daily. 09/06/19   Cirigliano, Mary K, DO  carvedilol (COREG) 25 MG tablet TAKE 1 TABLET BY MOUTH TWICE DAILY 09/21/19   Cirigliano, Mary K, DO  cephALEXin (KEFLEX) 500 MG capsule Take 1 capsule (500 mg total) by mouth 2 (two) times daily. 09/25/19   Ward, Kristen N, DO  D3-1000 25 MCG (1000 UT) capsule Take 1 capsule (1,000 Units total) by mouth daily. 10/18/19   Cirigliano, Garvin Fila, DO  docusate sodium (COLACE) 100 MG capsule Take 1 capsule (100 mg total) by mouth daily as needed for mild constipation. 12/20/18   Desiree Hane, MD  ENSURE (ENSURE) Take 237 mLs by mouth every evening. Patient not taking: Reported on 10/25/2019 12/20/18   Desiree Hane, MD  ENTRESTO 49-51 MG TAKE 1 TABLET BY MOUTH EVERY DAY 09/21/19   Cirigliano, Stanton Kidney K, DO  ferrous sulfate 325 (65 FE) MG tablet Take 325 mg by mouth every other day.    [provider]  furosemide (LASIX) 40 MG tablet TAKE 1 TABLET BY MOUTH TWICE DAILY 09/21/19   Cirigliano, Mary K, DO  gabapentin (NEURONTIN) 100 MG capsule TAKE 1 CAPSULE BY MOUTH THREE TIMES DAILY 09/21/19   Cirigliano, Mary K, DO  glucose blood (ACCU-CHEK AVIVA PLUS) test strip Use to test blood sugars 1-2 times daily. 09/06/19   Cirigliano, Mary K, DO  guaiFENesin-codeine (ROBITUSSIN AC) 100-10 MG/5ML syrup Take 5 mLs by mouth 2 (two) times daily as needed for cough. 09/13/19   Virgel Manifold, MD  hydrALAZINE (APRESOLINE) 25 MG tablet Take 1 tablet (25 mg total) by mouth 3 (three) times daily. 09/28/19   Skeet Latch, MD  hydroxychloroquine (PLAQUENIL) 200 MG tablet Take 1 tablet (200 mg total) by mouth 2 (two) times daily. 12/20/18   Desiree Hane, MD  hydroxypropyl methylcellulose / hypromellose (ISOPTO TEARS / GONIOVISC) 2.5 % ophthalmic solution Place 1 drop into both eyes daily. 12/20/18   Oretha Milch D, MD  isosorbide mononitrate (IMDUR) 30 MG 24 hr tablet TAKE 1 TABLET BY MOUTH EVERY DAY 09/21/19   Cirigliano, Mary K, DO   JANUVIA 25 MG tablet TAKE 1/2 TABLET BY MOUTH(12,5 MG) EVERY DAY 09/21/19   Cirigliano, Garvin Fila, DO  Lancets (ACCU-CHEK SOFT TOUCH) lancets Use to check blood sugars 1-2 times daily. 09/06/19   Cirigliano, Garvin Fila, DO  leflunomide (ARAVA) 20 MG tablet Take 1 tablet (20 mg total) by mouth daily. 12/20/18   Oretha Milch D, MD  Lidocaine (ASPERCREME LIDOCAINE) 4 % PTCH Apply 1 Package topically daily.     [provider]  LORazepam (ATIVAN) 0.5 MG tablet Take  0.25 mg by mouth as needed for anxiety.     [provider]  Melatonin 3 MG TABS Take 6 mg by mouth at bedtime.     [provider]  Olopatadine HCl 0.2 % SOLN Place 1 drop into both eyes daily. 12/20/18   Oretha Milch D, MD  potassium chloride SA (K-DUR) 20 MEQ tablet Take 20 mEq by mouth See admin instructions. Monday and Thursday    [provider]  predniSONE (DELTASONE) 10 MG tablet Take 10 mg by mouth daily with breakfast.    [provider]  Probiotic CAPS Take 1 capsule by mouth daily. 09/25/19   Ward, Delice Bison, DO  saccharomyces boulardii (FLORASTOR) 250 MG capsule Take 250 mg by mouth every other day.    [provider]  traMADol (ULTRAM) 50 MG tablet TAKE 1 TO 2 TABLETS BY MOUTH EVERY 6 HOURS AS NEEDED FOR PAIN 10/25/19   Cirigliano, Mary K, DO  XARELTO 20 MG TABS tablet TAKE 1 TABLET BY MOUTH EVERY DAY 10/03/19   Cirigliano, Garvin Fila, DO    Allergies    Clonidine derivatives, Fish allergy, Shellfish allergy, Doxycycline, Indomethacin, Lyrica [pregabalin], Methyldopa, Morphine and related, Orange fruit [citrus], Strawberry (diagnostic), Cetirizine hcl, Codeine, Levaquin [levofloxacin in d5w], and Tomato  Review of Systems   Review of Systems  Constitutional: Negative for activity change, chills, diaphoresis and fever.  HENT: Negative for congestion.   Respiratory: Positive for cough. Negative for shortness of breath and wheezing.   Cardiovascular: Positive for chest pain.  Negative for palpitations and leg swelling.  Gastrointestinal: Negative for abdominal pain, blood in stool, diarrhea, nausea and vomiting.  Genitourinary: Negative for dysuria.  Musculoskeletal: Negative for arthralgias, back pain and myalgias.  Skin: Negative for rash.  Allergic/Immunologic: Negative for immunocompromised state.  Neurological: Negative for dizziness, syncope, weakness, light-headedness, numbness and headaches.  Psychiatric/Behavioral: Negative for confusion.    Physical Exam Updated Vital Signs BP (!) 150/71 (BP Location: Left Arm)   Pulse 76   Temp (!) 97.4 F (36.3 C) (Oral)   Resp 20   SpO2 100%   Physical Exam Vitals and nursing note reviewed.  Constitutional:      General: She is not in acute distress.    Appearance: She is not ill-appearing, toxic-appearing or diaphoretic.     Comments: Pleasant, elderly female  HENT:     Head: Normocephalic.  Eyes:     Conjunctiva/sclera: Conjunctivae normal.  Cardiovascular:     Rate and Rhythm: Normal rate and regular rhythm.     Heart sounds: Murmur present. No friction rub. No gallop.   Pulmonary:     Effort: Pulmonary effort is normal. No respiratory distress.     Breath sounds: No stridor. No wheezing, rhonchi or rales.     Comments: Lungs are clear.  No increased work of breathing.  Chest:     Chest wall: No tenderness.  Abdominal:     General: There is no distension.     Palpations: Abdomen is soft. There is no mass.     Tenderness: There is no abdominal tenderness. There is no right CVA tenderness, left CVA tenderness, guarding or rebound.     Hernia: No hernia is present.  Musculoskeletal:     Cervical back: Neck supple.     Right lower leg: No edema.     Left lower leg: No edema.  Skin:    General: Skin is warm.     Findings: No rash.  Neurological:  Mental Status: She is alert.  Psychiatric:        Behavior: Behavior normal.     ED Results / Procedures / Treatments   Labs (all labs  ordered are listed, but only abnormal results are displayed) Labs Reviewed  CBC - Abnormal; Notable for the following components:      Result Value   RBC 2.86 (*)    Hemoglobin 9.2 (*)    HCT 28.9 (*)    MCV 101.0 (*)    All other components within normal limits  COMPREHENSIVE METABOLIC PANEL - Abnormal; Notable for the following components:   Glucose, Bld 107 (*)    BUN 35 (*)    Creatinine, Ser 1.05 (*)    Calcium 8.3 (*)    Total Protein 6.0 (*)    Albumin 2.9 (*)    Total Bilirubin 0.1 (*)    GFR calc non Af Amer 48 (*)    GFR calc Af Amer 55 (*)    All other components within normal limits  CBG MONITORING, ED  TROPONIN I (HIGH SENSITIVITY)  TROPONIN I (HIGH SENSITIVITY)    EKG EKG Interpretation  Date/Time:  Sunday October 29 2019 22:52:04 EST Ventricular Rate:  77 PR Interval:    QRS Duration: 107 QT Interval:  442 QTC Calculation: 501 R Axis:   -43 Text Interpretation: Sinus rhythm Left anterior fascicular block Left ventricular hypertrophy Anterior Q waves, possibly due to LVH Prolonged QT interval No significant change since last tracing Confirmed by Merrily Pew [phone]) on 10/29/2019 10:59:17 PM   Radiology DG Chest Portable 1 View  Result Date: 10/29/2019 CLINICAL DATA:  Chest pain COVID positive. EXAM: PORTABLE CHEST 1 VIEW COMPARISON:  October 26, 2019 FINDINGS: There is suggestion of hazy bilateral airspace opacities most evident in the bilateral mid lung zones and at the left lung base. The heart size is stable from prior study. There is no pneumothorax. No large pleural effusion. There are end-stage degenerative changes of both glenohumeral joints. There is no acute osseous abnormality detected. The thoracic aorta is tortuous and calcified. IMPRESSION: Suggestion of the elevating subtle hazy bilateral airspace opacities which can be seen in patients with an atypical infectious process. Electronically Signed   By: Constance Holster [name]   On: 10/29/2019  23:04    Procedures Procedures (including critical care time)  Medications Ordered in ED Medications  carvedilol (COREG) tablet 25 mg (25 mg Oral Given 10/30/19 0208)  sacubitril-valsartan (ENTRESTO) 49-51 mg per tablet (has no administration in time range)  furosemide (LASIX) tablet 40 mg (40 mg Oral Given 10/30/19 0209)  gabapentin (NEURONTIN) capsule 100 mg (has no administration in time range)  hydrALAZINE (APRESOLINE) tablet 25 mg (has no administration in time range)  hydroxychloroquine (PLAQUENIL) tablet 200 mg (200 mg Oral Given 10/30/19 0327)  linagliptin (TRADJENTA) tablet 5 mg (has no administration in time range)  leflunomide (ARAVA) tablet 20 mg (has no administration in time range)  LORazepam (ATIVAN) tablet 0.25 mg (has no administration in time range)  predniSONE (DELTASONE) tablet 10 mg (has no administration in time range)  traMADol (ULTRAM) tablet 50-100 mg (has no administration in time range)  rivaroxaban (XARELTO) tablet 20 mg (has no administration in time range)    ED Course  I have reviewed the triage vital signs and the nursing notes.  Pertinent labs & imaging results that were available during my care of the patient were reviewed by me and considered in my medical decision making (see chart for details).  MDM Rules/Calculators/A&P                      85 year old female with a history of diabetes mellitus type 2, CKD stage III, CAD, chronic combined systolic and diastolic heart failure, chronic gout, DVT and PE on Xarelto, CVA, rheumatoid arthritis on Plaquenil presenting by EMS with cough and chest pain.  She tested positive for COVID-19 on December 17.  Symptoms worsened today.  She has had no constitutional symptoms.  Vital signs are normal in the ER and she is overall well-appearing.  Given that she has had some episodes of chest pain that are not accompanied by coughing episodes and has a significant cardiac history, will check labs, troponin, chest  x-ray, and EKG.  If work-up is overall reassuring and the patient does not need to be admitted, she will require a social work consults as the patient's daughter is also ill with COVID-19 and is unable to care for the patient.  The patient's daughter reports that home health stopped showing up after the patient tested positive for Covid. The patient does not ambulate and requires a lift assistance in her home where she lives alone.   EKG is unchanged.  Delta troponin trend is flat.  This x-ray suggestive of hazy bilateral airspace opacities in the bilateral lower lung bases and at the left lung base, which can be seen in patients with an atypical infectious process.  Suspect this is from COVID-19.  Labs are otherwise reassuring.  The patient has had no hypoxia or increased work of breathing and vital signs remained stable.  She is overall well-appearing and does not meet admission criteria at this time.  The patient was discussed and evaluated by Dr. Maryan Rued, attending physician who is in agreement with the work-up and plan. Transitions of care consult has been placed.  Patient care transferred to [place]  at the end of my shift. Patient presentation, ED course, and plan of care discussed with review of all pertinent labs and imaging. Please see his/her note for further details regarding further ED course and disposition.  Final Clinical Impression(s) / ED Diagnoses Final diagnoses:  [id number] with multiple comorbidities    Rx / DC Orders ED Discharge Orders    None       Keshan Reha A, PA-C 10/30/19 4827    Blanchie Dessert, MD 11/06/19 2248

## 2019-10-30 DIAGNOSIS — U071 COVID-19: Secondary | ICD-10-CM | POA: Diagnosis not present

## 2019-10-30 LAB — TROPONIN I (HIGH SENSITIVITY): Troponin I (High Sensitivity): 12 ng/L (ref <18)

## 2019-10-30 MED ORDER — FUROSEMIDE 20 MG PO TABS
40.0000 mg | ORAL_TABLET | Freq: Two times a day (BID) | ORAL | Status: DC
  Administered 2019-10-30 – 2019-11-01 (×6): 40 mg via ORAL
  Filled 2019-10-30 (×6): qty 2

## 2019-10-30 MED ORDER — SACUBITRIL-VALSARTAN 49-51 MG PO TABS
1.0000 | ORAL_TABLET | Freq: Every day | ORAL | Status: DC
  Administered 2019-10-30 – 2019-11-01 (×3): 1 via ORAL
  Filled 2019-10-30 (×3): qty 1

## 2019-10-30 MED ORDER — TRAMADOL HCL 50 MG PO TABS: 50.0000 mg | ORAL_TABLET | Freq: Four times a day (QID) | ORAL | Status: DC | PRN

## 2019-10-30 MED ORDER — GABAPENTIN 100 MG PO CAPS
100.0000 mg | ORAL_CAPSULE | Freq: Three times a day (TID) | ORAL | Status: DC
  Administered 2019-10-30 – 2019-11-01 (×7): 100 mg via ORAL
  Filled 2019-10-30 (×7): qty 1

## 2019-10-30 MED ORDER — PREDNISONE 20 MG PO TABS
10.0000 mg | ORAL_TABLET | Freq: Every day | ORAL | Status: DC
  Administered 2019-10-30 – 2019-11-01 (×3): 10 mg via ORAL
  Filled 2019-10-30 (×3): qty 1

## 2019-10-30 MED ORDER — CARVEDILOL 12.5 MG PO TABS
25.0000 mg | ORAL_TABLET | Freq: Two times a day (BID) | ORAL | Status: DC
  Administered 2019-10-30 – 2019-11-01 (×6): 25 mg via ORAL
  Filled 2019-10-30 (×6): qty 2

## 2019-10-30 MED ORDER — HYDRALAZINE HCL 25 MG PO TABS
25.0000 mg | ORAL_TABLET | Freq: Three times a day (TID) | ORAL | Status: DC
  Administered 2019-10-30 – 2019-11-01 (×7): 25 mg via ORAL
  Filled 2019-10-30 (×8): qty 1

## 2019-10-30 MED ORDER — RIVAROXABAN 20 MG PO TABS
20.0000 mg | ORAL_TABLET | Freq: Every day | ORAL | Status: DC
  Administered 2019-10-30 – 2019-10-31 (×2): 20 mg via ORAL
  Filled 2019-10-30 (×5): qty 1

## 2019-10-30 MED ORDER — LEFLUNOMIDE 20 MG PO TABS
20.0000 mg | ORAL_TABLET | Freq: Every day | ORAL | Status: DC
  Administered 2019-10-30 – 2019-11-01 (×3): 20 mg via ORAL
  Filled 2019-10-30 (×3): qty 1

## 2019-10-30 MED ORDER — HYDROXYCHLOROQUINE SULFATE 200 MG PO TABS
200.0000 mg | ORAL_TABLET | Freq: Two times a day (BID) | ORAL | Status: DC
  Administered 2019-10-30 – 2019-11-01 (×6): 200 mg via ORAL
  Filled 2019-10-30 (×9): qty 1

## 2019-10-30 MED ORDER — LINAGLIPTIN 5 MG PO TABS
5.0000 mg | ORAL_TABLET | Freq: Every day | ORAL | Status: DC
  Administered 2019-10-30 – 2019-11-01 (×3): 5 mg via ORAL
  Filled 2019-10-30 (×3): qty 1

## 2019-10-30 MED ORDER — LORAZEPAM 0.5 MG PO TABS
0.2500 mg | ORAL_TABLET | ORAL | Status: DC | PRN
  Administered 2019-11-01: 0.25 mg via ORAL
  Filled 2019-10-30: qty 1

## 2019-10-30 NOTE — ED Notes (Signed)
Nurse Navigator checked on pt and asked her if she needed any help getting in touch with family or updating family or if she had any questions about her POC>  Pt declines this.  Brought pt her meal tray and additional warm blankets.  No distress noted at this time.

## 2019-10-30 NOTE — Progress Notes (Addendum)
CSW received a call from ED Secretary at [facility] stating family is requesting a update.  6:48 PM CSW updated pt's daughter Miss [name] [name].  Pt's daughter was appreciative and thanked the CSW.  CSW will continue to follow for D/C needs.  RN updated.  Alphonse Guild. Stephaun Million, LCSW, LCAS, CSI Transitions of Care Clinical Social Worker Care Coordination Department Ph: [phone]

## 2019-10-30 NOTE — Progress Notes (Signed)
CSW spoke with patient's daughter Tomasa Hosteller to discuss SNF recommendation by PT. Tomasa Hosteller reports her mother is agreeable to SNF and she prefers [facility]. CSW will complete SNF fax out in [facility] and keep patient's daughter updated with information.  Madilyn Fireman, MSW, LCSW-A Transitions of Care  Clinical Social Worker  [facility] Emergency Departments  Medical ICU [phone]

## 2019-10-30 NOTE — ED Notes (Addendum)
PHYSICAL THERAPY HAS COMPLETED EVAL AND RECOMMEND REHAB FOR PT.

## 2019-10-30 NOTE — Evaluation (Signed)
Physical Therapy Evaluation Patient Details Name: [name] [name] MRN: [id number] DOB: Nov 30, 1929 Today's Date: 10/30/2019   History of Present Illness  [name] [name] is a 85 y.o. female with a history of diabetes mellitus type 2, CKD stage III, CAD, chronic combined systolic and diastolic heart failure, chronic gout, DVT and PE on Xarelto, CVA, rheumatoid arthritis on Plaquenil who presents to the emergency department with a chief complaint of chest pain.  Recently diagnosed wtih Covid and has lost her aide assistance at home, daughter unable to provide care as she also is ill.  Clinical Impression  Patient presents with limited mobility due to weakness and unable to care for herself at home.  Previously had aide assistance in the home, but has stopped due to her infection with Covid-19.  She will benefit from skilled PT in the acute setting and follow up SNF level rehab at d/c.     Follow Up Recommendations SNF    Equipment Recommendations  None recommended by PT    Recommendations for Other Services       Precautions / Restrictions Precautions Precautions: Fall      Mobility  Bed Mobility Overal bed mobility: Needs Assistance Bed Mobility: Supine to Sit;Sit to Supine     Supine to sit: Max assist;HOB elevated Sit to supine: Max assist   General bed mobility comments: patient able to pull up on rail and with HHA to sit, assist for both legs off bed; to supine assist for legs and positioning trunk  Transfers                 General transfer comment: NT, uses lift at home  Ambulation/Gait                Stairs            Wheelchair Mobility    Modified Rankin (Stroke Patients Only)       Balance Overall balance assessment: Needs assistance Sitting-balance support: Bilateral upper extremity supported;Feet unsupported;Single extremity supported Sitting balance-Leahy Scale: Poor Sitting balance - Comments: can sit with S with UE support        Standing balance comment: NT                             Pertinent Vitals/Pain Pain Assessment: No/denies pain(had chest pain with cough, but better now)    Home Living Family/patient expects to be discharged to:: Skilled nursing facility                 Additional Comments: from home, but struggling due to decreased aide help since she was diagnosed with Covid    Prior Function Level of Independence: Needs assistance   Gait / Transfers Assistance Needed: OOB via lift with aide help  ADL's / Homemaking Assistance Needed: Dependence in B/D and ADLS  Comments: has lift chair she stays in during the day and hospital bed she sleeps in at night, severe limitations per pt due to arthritis     Hand Dominance        Extremity/Trunk Assessment   Upper Extremity Assessment Upper Extremity Assessment: LUE deficits/detail;RUE deficits/detail RUE Deficits / Details: AAROM limited shoulder elevation to about 85 with pain, strength shoulder flexion 2+/5, elbow flexion 3+/5, extension 2+/5, grip limited due to arthritic changes in hands LUE Deficits / Details: AAROM limited shoulder elevation to about 95 with pain, strength elbow flexion 4-/5, extension 3+/5, grip limited, equal to R  Lower Extremity Assessment Lower Extremity Assessment: LLE deficits/detail;RLE deficits/detail RLE Deficits / Details: AAROM WFL, strength hip flexion 2/5, knee extension 3+/5, ankle DF 0/5 LLE Deficits / Details: AAROM WFL, strength hip flexion 3/5, knee extension 4-/5, ankle DF 1/5       Communication   Communication: No difficulties  Cognition Arousal/Alertness: Awake/alert Behavior During Therapy: WFL for tasks assessed/performed Overall Cognitive Status: Within Functional Limits for tasks assessed                                        General Comments General comments (skin integrity, edema, etc.): VSS stable with EOB activity, no coughing during  session    Exercises General Exercises - Lower Extremity Long Arc Quad: AROM;10 reps;5 reps;Seated   Assessment/Plan    PT Assessment Patient needs continued PT services  PT Problem List Decreased range of motion;Decreased balance;Decreased mobility;Decreased activity tolerance;Decreased strength       PT Treatment Interventions Therapeutic activities;Balance training;Patient/family education;Therapeutic exercise;Functional mobility training    PT Goals (Current goals can be found in the Care Plan section)  Acute Rehab PT Goals Patient Stated Goal: agrees to STSNF till can get aide assist back in the home PT Goal Formulation: With patient Time For Goal Achievement: 11/13/19 Potential to Achieve Goals: Fair    Frequency Min 2X/week   Barriers to discharge        Co-evaluation               AM-PAC PT "6 Clicks" Mobility  Outcome Measure Help needed turning from your back to your side while in a flat bed without using bedrails?: A Little Help needed moving from lying on your back to sitting on the side of a flat bed without using bedrails?: Total Help needed moving to and from a bed to a chair (including a wheelchair)?: Total Help needed standing up from a chair using your arms (e.g., wheelchair or bedside chair)?: Total Help needed to walk in hospital room?: Total Help needed climbing 3-5 steps with a railing? : Total 6 Click Score: 8    End of Session   Activity Tolerance: Patient tolerated treatment well Patient left: in bed;with call bell/phone within reach Nurse Communication: Mobility status;Other (comment)(needs cleaning) PT Visit Diagnosis: Muscle weakness (generalized) (M62.81)    Time: 7253-6644 PT Time Calculation (min) (ACUTE ONLY): 35 min   Charges:   PT Evaluation $PT Eval Moderate Complexity: 1 Mod PT Treatments $Therapeutic Activity: 8-22 mins        Sheran Lawless, [place] [facility] [phone] 10/30/2019   Elray Mcgregor 10/30/2019, 11:22 AM

## 2019-10-30 NOTE — Discharge Planning (Signed)
EDCM to follow for disposition needs.  

## 2019-10-30 NOTE — NC FL2 (Signed)
[place] LEVEL OF CARE SCREENING TOOL     IDENTIFICATION  Patient Name: [name] [name] Birthdate: 15-Sep-1930 Sex: female Admission Date (Current Location): 10/29/2019  [facility] and [place] Number:  Herbalist and Address:  [facility] [place] [place] [address] [place], [place] [address]      Provider Number: [id number]  Attending Physician Name and Address:  Default, Provider, MD  Relative Name and Phone Number:  Tomasa Hosteller [phone]    Current Level of Care: Hospital Recommended Level of Care: [place] Prior Approval Number:    Date Approved/Denied:   PASRR Number: [id number]  Discharge Plan: SNF    Current Diagnoses: Patient Active Problem List   Diagnosis Date Noted  . Pre-operative clearance 05/29/2019  . Volume overload 05/29/2019  . Acute metabolic encephalopathy 35/00/9381  . Pressure injury of skin 01/12/2019  . AMS (altered mental status) 01/09/2019  . Rheumatoid arthritis flare ([place]) 12/23/2018  . Weakness of right lower extremity 12/23/2018  . Abscess of right upper extremity 12/14/2018  . Fever 12/13/2018  . Hand swelling 12/02/2018  . Pain, wrist 12/02/2018  . Hand pain 12/02/2018  . Polyarthritis 12/02/2018  . Spinal stenosis, lumbar 11/19/2018  . Acute encephalopathy 11/19/2018  . History of CVA (cerebrovascular accident) 11/19/2018  . Acute gout 11/15/2018  . Low back pain 11/14/2018  . Chronic back pain 10/27/2018  . Arthralgia of both knees 10/27/2018  . Rheumatoid arthritis involving both knees ([place]) 10/27/2018  . Acute pain 10/21/2018  . Physical deconditioning 09/06/2018  . Hyperlipidemia associated with type 2 diabetes mellitus ([place]), refuses statin 09/06/2018  . Refusal of statin medication by patient 09/04/2018  . Deep vein thrombosis (DVT) of distal vein of right lower extremity ([place]) 07/30/2018  . Hypertensive heart disease with heart failure ([place]) 07/30/2018  . Pulmonary  embolism ([place]), 07/2018, on Xarelto with goal to DC after 6 months if more active 07/10/2018  . Elevated brain natriuretic peptide (BNP) level   . DCM (dilated cardiomyopathy) ([place])   . Benign hypertensive heart and kidney disease with CHF and stage 3 chronic kidney disease ([place]) 05/28/2018  . Osteoarthritis 05/06/2018  . Hypokalemia 02/02/2018  . Adhesive capsulitis of right shoulder 10/12/2017  . Idiopathic chronic gout of multiple sites with tophus 08/25/2017  . History of total knee arthroplasty, bilateral 02/18/2017  . History of rotator cuff surgery 02/18/2017  . High risk medications, long-term use 09/21/2016  . Chronic combined systolic and diastolic CHF (congestive heart failure) ([place]) 09/12/2015  . Bilateral edema of lower extremity 06/05/2014  . CKD stage 3 due to type 2 diabetes mellitus ([place]) 06/05/2014  . DM (diabetes mellitus), type 2 with renal complications ([place]) 82/99/3716  . Anemia of chronic disease 05/01/2014  . CAD (coronary artery disease) 10/18/2013  . Hypertension associated with diabetes ([place]) 08/08/2013  . Fibromyalgia 05/10/2013  . Type 2 diabetes mellitus with hyperlipidemia ([place]), patient declines statin 04/27/2007  . Allergic rhinitis 04/27/2007  . Diverticulosis 04/27/2007    Orientation RESPIRATION BLADDER Height & Weight     Self, Time, Situation, Place  Normal Continent Weight:   Height:     BEHAVIORAL SYMPTOMS/MOOD NEUROLOGICAL BOWEL NUTRITION STATUS      Continent Diet(Heart healthy)  AMBULATORY STATUS COMMUNICATION OF NEEDS Skin   Extensive Assist Verbally                         Personal Care Assistance Level of Assistance  Dressing, Feeding,  Bathing Bathing Assistance: Maximum assistance Feeding assistance: Limited assistance Dressing Assistance: Maximum assistance     Functional Limitations Info  Sight, Speech, Hearing Sight Info: Adequate Hearing Info: Adequate Speech Info: Adequate    SPECIAL CARE FACTORS FREQUENCY  PT  (By licensed PT), OT (By licensed OT)     PT Frequency: 5x weekly OT Frequency: 5x weekly            Contractures Contractures Info: Not present    Additional Factors Info  Allergies   Allergies Info: Clonidine Derivatives, Fish Allergy, Shellfish Allergy, Doxycycline, Indomethacin, Lyrica Pregabalin, Methyldopa, Morphine And Related, Orange Fruit Citrus, Strawberry Diagnostic, Cetirizine Hcl, Codeine, Levaquin Levofloxacin In D5w, Tomato           Current Medications (10/30/2019):  This is the current hospital active medication list Current Facility-Administered Medications  Medication Dose Route Frequency Provider Last Rate Last Admin  . carvedilol (COREG) tablet 25 mg  25 mg Oral BID McDonald, Mia A, PA-C   25 mg at 10/30/19 1133  . furosemide (LASIX) tablet 40 mg  40 mg Oral BID McDonald, Mia A, PA-C   40 mg at 10/30/19 1136  . gabapentin (NEURONTIN) capsule 100 mg  100 mg Oral TID McDonald, Mia A, PA-C   100 mg at 10/30/19 1133  . hydrALAZINE (APRESOLINE) tablet 25 mg  25 mg Oral TID McDonald, Mia A, PA-C   25 mg at 10/30/19 1134  . hydroxychloroquine (PLAQUENIL) tablet 200 mg  200 mg Oral BID McDonald, Mia A, PA-C   200 mg at 10/30/19 1131  . leflunomide (ARAVA) tablet 20 mg  20 mg Oral Daily McDonald, Mia A, PA-C   20 mg at 10/30/19 1137  . linagliptin (TRADJENTA) tablet 5 mg  5 mg Oral Daily McDonald, Mia A, PA-C   5 mg at 10/30/19 1132  . LORazepam (ATIVAN) tablet 0.25 mg  0.25 mg Oral PRN McDonald, Mia A, PA-C      . predniSONE (DELTASONE) tablet 10 mg  10 mg Oral Q breakfast McDonald, Mia A, PA-C   10 mg at 10/30/19 1131  . rivaroxaban (XARELTO) tablet 20 mg  20 mg Oral QAC supper McDonald, Mia A, PA-C      . sacubitril-valsartan (ENTRESTO) 49-51 mg per tablet  1 tablet Oral Daily McDonald, Mia A, PA-C   1 tablet at 10/30/19 1137  . traMADol (ULTRAM) tablet 50-100 mg  50-100 mg Oral Q6H PRN McDonald, Mia A, PA-C       Current Outpatient Medications  Medication Sig  Dispense Refill  . acetaminophen (TYLENOL) 500 MG tablet Take 1,000 mg by mouth every 8 (eight) hours as needed for mild pain.     Marland Kitchen albuterol (PROVENTIL HFA;VENTOLIN HFA) 108 (90 Base) MCG/ACT inhaler Inhale 1-2 puffs into the lungs every 6 (six) hours as needed for wheezing or shortness of breath. 1 Inhaler 0  . allopurinol (ZYLOPRIM) 100 MG tablet TAKE 1 TABLET BY MOUTH EVERY DAY AT BEDTIME (Patient taking differently: Take 100 mg by mouth at bedtime. ) 90 tablet 3  . benzonatate (TESSALON) 100 MG capsule Take 1 capsule (100 mg total) by mouth 2 (two) times daily as needed for cough. 20 capsule 0  . carvedilol (COREG) 25 MG tablet TAKE 1 TABLET BY MOUTH TWICE DAILY (Patient taking differently: Take 25 mg by mouth 2 (two) times daily with a meal. ) 180 tablet 3  . D3-1000 25 MCG (1000 UT) capsule Take 1 capsule (1,000 Units total) by mouth daily. 90 capsule 3  .  Dextromethorphan-guaiFENesin (MUCINEX DM MAXIMUM STRENGTH) 60-1200 MG TB12 Take 1 tablet by mouth 2 (two) times daily as needed (cough and congestion).    Marland Kitchen docusate sodium (COLACE) 100 MG capsule Take 1 capsule (100 mg total) by mouth daily as needed for mild constipation. 10 capsule 0  . ENTRESTO 49-51 MG TAKE 1 TABLET BY MOUTH EVERY DAY (Patient taking differently: Take 1 tablet by mouth daily. ) 90 tablet 3  . furosemide (LASIX) 40 MG tablet TAKE 1 TABLET BY MOUTH TWICE DAILY (Patient taking differently: Take 40 mg by mouth 2 (two) times daily. ) 180 tablet 3  . gabapentin (NEURONTIN) 100 MG capsule TAKE 1 CAPSULE BY MOUTH THREE TIMES DAILY (Patient taking differently: Take 100 mg by mouth 3 (three) times daily. ) 270 capsule 3  . hydrALAZINE (APRESOLINE) 25 MG tablet Take 1 tablet (25 mg total) by mouth 3 (three) times daily. 270 tablet 1  . hydroxychloroquine (PLAQUENIL) 200 MG tablet Take 1 tablet (200 mg total) by mouth 2 (two) times daily. 60 tablet 0  . hydroxypropyl methylcellulose / hypromellose (ISOPTO TEARS / GONIOVISC) 2.5 %  ophthalmic solution Place 1 drop into both eyes daily. 15 mL 0  . isosorbide mononitrate (IMDUR) 30 MG 24 hr tablet TAKE 1 TABLET BY MOUTH EVERY DAY (Patient taking differently: Take 30 mg by mouth daily. ) 90 tablet 3  . JANUVIA 25 MG tablet TAKE 1/2 TABLET BY MOUTH(12,5 MG) EVERY DAY (Patient taking differently: Take 12.5 mg by mouth daily. ) 45 tablet 3  . Olopatadine HCl 0.2 % SOLN Place 1 drop into both eyes daily. 2.5 mL 0  . Omega 3-6-9 Fatty Acids (OMEGA 3-6-9 COMPLEX) CAPS Take 1 capsule by mouth daily. Omega 3 Black    . potassium chloride SA (K-DUR) 20 MEQ tablet Take 20 mEq by mouth See admin instructions. Monday and Thursday    . predniSONE (DELTASONE) 5 MG tablet Take 7.5 mg by mouth daily with breakfast. Taking 1 & 1/2 tabs = 7.35m    . saccharomyces boulardii (FLORASTOR) 250 MG capsule Take 250 mg by mouth every other day.    . traMADol (ULTRAM) 50 MG tablet TAKE 1 TO 2 TABLETS BY MOUTH EVERY 6 HOURS AS NEEDED FOR PAIN (Patient taking differently: Take 50-100 mg by mouth every 6 (six) hours as needed for moderate pain. ) 120 tablet 0  . XARELTO 20 MG TABS tablet TAKE 1 TABLET BY MOUTH EVERY DAY (Patient taking differently: Take 20 mg by mouth daily with supper. ) 30 tablet 0  . Blood Glucose Monitoring Suppl (ACCU-CHEK AVIVA PLUS) w/Device KIT Use to check blood sugars 1-2 times daily. 1 kit 0  . cephALEXin (KEFLEX) 500 MG capsule Take 1 capsule (500 mg total) by mouth 2 (two) times daily. (Patient not taking: Reported on 10/30/2019) 14 capsule 0  . ENSURE (ENSURE) Take 237 mLs by mouth every evening. (Patient not taking: Reported on 10/25/2019) 237 mL 12  . glucose blood (ACCU-CHEK AVIVA PLUS) test strip Use to test blood sugars 1-2 times daily. 100 each 12  . guaiFENesin-codeine (ROBITUSSIN AC) 100-10 MG/5ML syrup Take 5 mLs by mouth 2 (two) times daily as needed for cough. (Patient not taking: Reported on 10/30/2019) 120 mL 0  . Lancets (ACCU-CHEK SOFT TOUCH) lancets Use to check  blood sugars 1-2 times daily. 100 each 12  . leflunomide (ARAVA) 20 MG tablet Take 1 tablet (20 mg total) by mouth daily. (Patient not taking: Reported on 10/30/2019) 30 tablet 0  .  LORazepam (ATIVAN) 0.5 MG tablet Take 0.25 mg by mouth as needed for anxiety.     . Probiotic CAPS Take 1 capsule by mouth daily. (Patient not taking: Reported on 10/30/2019) 30 capsule 0     Discharge Medications: Please see discharge summary for a list of discharge medications.  Relevant Imaging Results:  Relevant Lab Results:   Additional Information SSN#: [id number]  Archie Endo, LCSW

## 2019-10-30 NOTE — ED Notes (Signed)
Ordered a hospital bed--[name] [name]  

## 2019-10-30 NOTE — ED Notes (Signed)
Please call daughter [name] @ [phone] for a status update--[name] [name]

## 2019-10-30 NOTE — ED Notes (Signed)
Waiting for PT consult and SW dispo.  Pt restful without acute needs noted.

## 2019-10-30 NOTE — ED Provider Notes (Addendum)
Pt is awaiting placement. No issues currently.  I spoke with pt. She is eating and drinking.  She denies any complaints   Fransico Meadow, PA-C 10/30/19 1457    Sidney Ace 10/30/19 1516    Quintella Reichert, MD 11/01/19 [phone]

## 2019-10-30 NOTE — ED Notes (Signed)
Patient was given a Cup of Coffee and Cup of water.

## 2019-10-31 DIAGNOSIS — U071 COVID-19: Secondary | ICD-10-CM | POA: Diagnosis not present

## 2019-10-31 DIAGNOSIS — M4800 Spinal stenosis, site unspecified: Secondary | ICD-10-CM | POA: Diagnosis not present

## 2019-10-31 NOTE — ED Notes (Signed)
Breakfast Ordered 

## 2019-10-31 NOTE — ED Notes (Signed)
Lunch Tray Ordered @ 1124. 

## 2019-10-31 NOTE — ED Notes (Signed)
Performed peri care and changed purewick device, warm blankets and fresh bed pads placed.

## 2019-10-31 NOTE — ED Notes (Signed)
Per CSW, pt to be discharged from ED in AM, has a bed at [facility].

## 2019-10-31 NOTE — ED Notes (Signed)
Dinner tray ordered.

## 2019-10-31 NOTE — ED Notes (Signed)
Pt was given a bed bath and purewick changed.

## 2019-10-31 NOTE — Progress Notes (Addendum)
2:30pm: CSW received return call from patient's daughter Tomasa Hosteller stating the outstanding bill from [facility] will be paid in full tomorrow morning.  CSW contacted Olivia Mackie at [facility] to discuss details of discharging the patient tomorrow morning.  1pm: CSW received call from [place] at [facility] who states the facility can offer a bed, however the patient has an outstanding balance from a previous SNF admission.  CSW spoke with patient's daughter Tomasa Hosteller to inform her of information, Tomasa Hosteller stated awareness of outstanding balance. Tomasa Hosteller reports she just ended a call with [facility] to determine where the additional charges came from at [facility]. Tomasa Hosteller to call [facility] directly to determine if there is a payment plan available.  CSW to continue following until discharge is complete.  Madilyn Fireman, MSW, LCSW-A Transitions of Care  Clinical Social Worker  [facility] Emergency Departments  Medical ICU [phone]

## 2019-10-31 NOTE — ED Notes (Signed)
Pt comfortable without needs voiced.  Takes all her tabs/capsules in applesauce without issue

## 2019-11-01 DIAGNOSIS — I130 Hypertensive heart and chronic kidney disease with heart failure and stage 1 through stage 4 chronic kidney disease, or unspecified chronic kidney disease: Secondary | ICD-10-CM | POA: Diagnosis not present

## 2019-11-01 DIAGNOSIS — M797 Fibromyalgia: Secondary | ICD-10-CM | POA: Diagnosis not present

## 2019-11-01 DIAGNOSIS — N183 Chronic kidney disease, stage 3 unspecified: Secondary | ICD-10-CM | POA: Diagnosis not present

## 2019-11-01 DIAGNOSIS — E119 Type 2 diabetes mellitus without complications: Secondary | ICD-10-CM | POA: Diagnosis not present

## 2019-11-01 DIAGNOSIS — Z7401 Bed confinement status: Secondary | ICD-10-CM | POA: Diagnosis not present

## 2019-11-01 DIAGNOSIS — R05 Cough: Secondary | ICD-10-CM | POA: Diagnosis not present

## 2019-11-01 DIAGNOSIS — L88 Pyoderma gangrenosum: Secondary | ICD-10-CM | POA: Diagnosis not present

## 2019-11-01 DIAGNOSIS — I2510 Atherosclerotic heart disease of native coronary artery without angina pectoris: Secondary | ICD-10-CM | POA: Diagnosis not present

## 2019-11-01 DIAGNOSIS — I2699 Other pulmonary embolism without acute cor pulmonale: Secondary | ICD-10-CM | POA: Diagnosis not present

## 2019-11-01 DIAGNOSIS — Z86711 Personal history of pulmonary embolism: Secondary | ICD-10-CM | POA: Diagnosis not present

## 2019-11-01 DIAGNOSIS — E559 Vitamin D deficiency, unspecified: Secondary | ICD-10-CM | POA: Diagnosis not present

## 2019-11-01 DIAGNOSIS — Z7901 Long term (current) use of anticoagulants: Secondary | ICD-10-CM | POA: Diagnosis not present

## 2019-11-01 DIAGNOSIS — R1312 Dysphagia, oropharyngeal phase: Secondary | ICD-10-CM | POA: Diagnosis not present

## 2019-11-01 DIAGNOSIS — E1122 Type 2 diabetes mellitus with diabetic chronic kidney disease: Secondary | ICD-10-CM | POA: Diagnosis not present

## 2019-11-01 DIAGNOSIS — I639 Cerebral infarction, unspecified: Secondary | ICD-10-CM | POA: Diagnosis not present

## 2019-11-01 DIAGNOSIS — L97212 Non-pressure chronic ulcer of right calf with fat layer exposed: Secondary | ICD-10-CM | POA: Diagnosis not present

## 2019-11-01 DIAGNOSIS — M6281 Muscle weakness (generalized): Secondary | ICD-10-CM | POA: Diagnosis not present

## 2019-11-01 DIAGNOSIS — R531 Weakness: Secondary | ICD-10-CM | POA: Diagnosis not present

## 2019-11-01 DIAGNOSIS — M48061 Spinal stenosis, lumbar region without neurogenic claudication: Secondary | ICD-10-CM | POA: Diagnosis not present

## 2019-11-01 DIAGNOSIS — E1169 Type 2 diabetes mellitus with other specified complication: Secondary | ICD-10-CM | POA: Diagnosis not present

## 2019-11-01 DIAGNOSIS — M069 Rheumatoid arthritis, unspecified: Secondary | ICD-10-CM | POA: Diagnosis not present

## 2019-11-01 DIAGNOSIS — D649 Anemia, unspecified: Secondary | ICD-10-CM | POA: Diagnosis not present

## 2019-11-01 DIAGNOSIS — I5042 Chronic combined systolic (congestive) and diastolic (congestive) heart failure: Secondary | ICD-10-CM | POA: Diagnosis not present

## 2019-11-01 DIAGNOSIS — M2550 Pain in unspecified joint: Secondary | ICD-10-CM | POA: Diagnosis not present

## 2019-11-01 DIAGNOSIS — N1831 Chronic kidney disease, stage 3a: Secondary | ICD-10-CM | POA: Diagnosis not present

## 2019-11-01 DIAGNOSIS — Z86718 Personal history of other venous thrombosis and embolism: Secondary | ICD-10-CM | POA: Diagnosis not present

## 2019-11-01 DIAGNOSIS — I10 Essential (primary) hypertension: Secondary | ICD-10-CM | POA: Diagnosis not present

## 2019-11-01 DIAGNOSIS — M259 Joint disorder, unspecified: Secondary | ICD-10-CM | POA: Diagnosis not present

## 2019-11-01 DIAGNOSIS — G9341 Metabolic encephalopathy: Secondary | ICD-10-CM | POA: Diagnosis not present

## 2019-11-01 DIAGNOSIS — U071 COVID-19: Secondary | ICD-10-CM | POA: Diagnosis not present

## 2019-11-01 DIAGNOSIS — R488 Other symbolic dysfunctions: Secondary | ICD-10-CM | POA: Diagnosis not present

## 2019-11-01 DIAGNOSIS — Z79899 Other long term (current) drug therapy: Secondary | ICD-10-CM | POA: Diagnosis not present

## 2019-11-01 NOTE — ED Notes (Signed)
Patient verbalizes understanding of discharge instructions. Opportunity for questioning and answers were provided. Pt discharged from ED. 

## 2019-11-01 NOTE — Discharge Instructions (Signed)
You are going to [facility]  Continue your current meds   See your doctor  Return to ER if you have worse trouble breathing, shortness of breath, fever for a week

## 2019-11-01 NOTE — ED Notes (Signed)
Breakfast ordered 

## 2019-11-01 NOTE — ED Notes (Signed)
Report called to Aniceto Boss, caregiver at [facility]

## 2019-11-01 NOTE — Progress Notes (Signed)
Physical Therapy Treatment Patient Details Name: [name] [name] MRN: [id number] DOB: 1930-10-12 Today's Date: 11/01/2019    History of Present Illness STORY CONTI is a 85 y.o. female with a history of diabetes mellitus type 2, CKD stage III, CAD, chronic combined systolic and diastolic heart failure, chronic gout, DVT and PE on Xarelto, CVA, rheumatoid arthritis on Plaquenil who presents to the emergency department with a chief complaint of chest pain.  Recently diagnosed wtih Covid and has lost her aide assistance at home, daughter unable to provide care as she also is ill.    PT Comments    Patient progressing with rolling and sitting balance this session.  Seems able to make progress and remains appropriate for SNF level rehab.  Patient to discharge today, defer further PT to SNF setting.    Follow Up Recommendations  SNF     Equipment Recommendations  None recommended by PT    Recommendations for Other Services       Precautions / Restrictions Precautions Precautions: Fall    Mobility  Bed Mobility Overal bed mobility: Needs Assistance Bed Mobility: Rolling Rolling: Min assist;Mod assist   Supine to sit: Max assist Sit to supine: Max assist   General bed mobility comments: rolling in bed for cleaning and to change brief due to soiled with BM, assist for moving hip forward and cues for pt to use rail; to come up to sit assist for legs and trunk; to supine assist for legs and to reposition/move up in bed  Transfers                 General transfer comment: NT, uses lift at home  Ambulation/Gait                 Stairs             Wheelchair Mobility    Modified Rankin (Stroke Patients Only)       Balance Overall balance assessment: Needs assistance Sitting-balance support: No upper extremity supported Sitting balance-Leahy Scale: Fair Sitting balance - Comments: sat with S without UE support to brush teeth Postural control:  Posterior lean                                  Cognition Arousal/Alertness: Awake/alert Behavior During Therapy: WFL for tasks assessed/performed Overall Cognitive Status: Within Functional Limits for tasks assessed                                        Exercises      General Comments General comments (skin integrity, edema, etc.): VSS during sitting activity      Pertinent Vitals/Pain Pain Assessment: Faces Faces Pain Scale: Hurts little more Pain Location: raw in perineal area Pain Descriptors / Indicators: Sore Pain Intervention(s): Monitored during session;Repositioned;Other (comment)(applied cream)    Home Living                      Prior Function            PT Goals (current goals can now be found in the care plan section) Progress towards PT goals: Progressing toward goals    Frequency    Min 2X/week      PT Plan Current plan remains appropriate    Co-evaluation  AM-PAC PT "6 Clicks" Mobility   Outcome Measure  Help needed turning from your back to your side while in a flat bed without using bedrails?: A Little Help needed moving from lying on your back to sitting on the side of a flat bed without using bedrails?: Total Help needed moving to and from a bed to a chair (including a wheelchair)?: Total Help needed standing up from a chair using your arms (e.g., wheelchair or bedside chair)?: Total Help needed to walk in hospital room?: Total Help needed climbing 3-5 steps with a railing? : Total 6 Click Score: 8    End of Session   Activity Tolerance: Patient tolerated treatment well Patient left: in bed;with call bell/phone within reach   PT Visit Diagnosis: Muscle weakness (generalized) (M62.81)     Time: 7673-4193 PT Time Calculation (min) (ACUTE ONLY): 28 min  Charges:  $Therapeutic Activity: 23-37 mins                     Sheran Lawless, [place] [facility] [phone] 11/01/2019    Elray Mcgregor 11/01/2019, 1:50 PM

## 2019-11-01 NOTE — ED Provider Notes (Signed)
  Physical Exam  BP 128/65   Pulse 89   Temp 97.9 F (36.6 C) (Oral)   Resp 13   Ht 5\' 4"  (1.626 m)   Wt 81.6 kg   SpO2 99%   BMI 30.90 kg/m   Physical Exam  ED Course/Procedures     Procedures  MDM  Patient has COVID 19 and doesn't meet admission criteria. Waiting placement. This morning, she will be going to [facility] this morning    Drenda Freeze, MD 11/01/19 1128

## 2019-11-01 NOTE — ED Notes (Signed)
Pt lunch tray delivered.

## 2019-11-01 NOTE — Progress Notes (Addendum)
11am: Patient will go to [facility] at [facility]. Patient will transport via [facility] at [facility] arranged for transportation. The number to call for report is  [phone].   8am: CSW received call from patient's daughter Tomasa Hosteller who reports she was able to pay the debt off at [facility].  CSW spoke with Olivia Mackie at [facility] who confirmed receipt of payment. Olivia Mackie to update CSW with bed assignment information as soon as it becomes available.  CSW faxed clinical information to [facility] for authorization.   Madilyn Fireman, MSW, LCSW-A Transitions of Care  Clinical Social Worker  [facility] Emergency Departments  Medical ICU [phone]

## 2019-11-01 NOTE — Progress Notes (Signed)
Attention [employer]   Please call Terri Piedra [phone] with authorization details.

## 2019-11-01 NOTE — ED Notes (Signed)
Attempted report 

## 2019-11-02 DIAGNOSIS — M069 Rheumatoid arthritis, unspecified: Secondary | ICD-10-CM | POA: Diagnosis not present

## 2019-11-02 DIAGNOSIS — R531 Weakness: Secondary | ICD-10-CM | POA: Diagnosis not present

## 2019-11-02 DIAGNOSIS — R05 Cough: Secondary | ICD-10-CM | POA: Diagnosis not present

## 2019-11-02 DIAGNOSIS — U071 COVID-19: Secondary | ICD-10-CM | POA: Diagnosis not present

## 2019-11-08 DIAGNOSIS — U071 COVID-19: Secondary | ICD-10-CM | POA: Diagnosis not present

## 2019-11-08 DIAGNOSIS — R05 Cough: Secondary | ICD-10-CM | POA: Diagnosis not present

## 2019-11-08 DIAGNOSIS — I130 Hypertensive heart and chronic kidney disease with heart failure and stage 1 through stage 4 chronic kidney disease, or unspecified chronic kidney disease: Secondary | ICD-10-CM | POA: Diagnosis not present

## 2019-11-08 DIAGNOSIS — E119 Type 2 diabetes mellitus without complications: Secondary | ICD-10-CM | POA: Diagnosis not present

## 2019-11-09 ENCOUNTER — Telehealth: Payer: Medicare Other | Admitting: Cardiovascular Disease

## 2019-11-13 ENCOUNTER — Telehealth: Payer: Self-pay

## 2019-11-13 NOTE — Telephone Encounter (Signed)
Patient daughter informed that at this time Dr. Artis Flock would not be able to see her as a PCP at this time. Pateitn daughter will call us back at a later date to follow up with [place].

## 2019-11-13 NOTE — Telephone Encounter (Signed)
Not at this time.  Thanks,  Dr. Artis Flock

## 2019-11-13 NOTE — Telephone Encounter (Signed)
Patient is requesting TOC from [facility] Dr. Barron Alvine to [facility] Dr. Artis Flock. Please advise.

## 2019-11-13 NOTE — Telephone Encounter (Signed)
Copied from CRM [id number]. Topic: Appointment Scheduling - Transfer of Care >> Nov 13, 2019  2:45 PM Gwenlyn Fudge wrote: Pt is requesting to transfer FROM: Dr. Barron Alvine  Pt is requesting to transfer TO: Dr. Artis Flock  Reason for requested transfer: Pts daughter is requesting to have a provider that is more available for the pts needs.   Send CRM to patient's current PCP (transferring FROM).

## 2019-11-14 DIAGNOSIS — N1831 Chronic kidney disease, stage 3a: Secondary | ICD-10-CM | POA: Diagnosis not present

## 2019-11-14 DIAGNOSIS — U071 COVID-19: Secondary | ICD-10-CM | POA: Diagnosis not present

## 2019-11-14 DIAGNOSIS — R05 Cough: Secondary | ICD-10-CM | POA: Diagnosis not present

## 2019-11-14 DIAGNOSIS — I130 Hypertensive heart and chronic kidney disease with heart failure and stage 1 through stage 4 chronic kidney disease, or unspecified chronic kidney disease: Secondary | ICD-10-CM | POA: Diagnosis not present

## 2019-11-15 ENCOUNTER — Other Ambulatory Visit: Payer: Self-pay | Admitting: Internal Medicine

## 2019-11-15 ENCOUNTER — Other Ambulatory Visit: Payer: Self-pay

## 2019-11-15 ENCOUNTER — Encounter: Payer: Self-pay | Admitting: Family Medicine

## 2019-11-15 DIAGNOSIS — H1013 Acute atopic conjunctivitis, bilateral: Secondary | ICD-10-CM

## 2019-11-15 MED ORDER — OLOPATADINE HCL 0.2 % OP SOLN: 1.0000 [drp] | Freq: Every day | OPHTHALMIC | 0 refills | Status: DC

## 2019-11-16 DIAGNOSIS — D631 Anemia in chronic kidney disease: Secondary | ICD-10-CM | POA: Diagnosis not present

## 2019-11-16 DIAGNOSIS — M797 Fibromyalgia: Secondary | ICD-10-CM | POA: Diagnosis not present

## 2019-11-16 DIAGNOSIS — M1990 Unspecified osteoarthritis, unspecified site: Secondary | ICD-10-CM | POA: Diagnosis not present

## 2019-11-16 DIAGNOSIS — E1122 Type 2 diabetes mellitus with diabetic chronic kidney disease: Secondary | ICD-10-CM | POA: Diagnosis not present

## 2019-11-16 DIAGNOSIS — I69398 Other sequelae of cerebral infarction: Secondary | ICD-10-CM | POA: Diagnosis not present

## 2019-11-16 DIAGNOSIS — I2510 Atherosclerotic heart disease of native coronary artery without angina pectoris: Secondary | ICD-10-CM | POA: Diagnosis not present

## 2019-11-16 DIAGNOSIS — E1151 Type 2 diabetes mellitus with diabetic peripheral angiopathy without gangrene: Secondary | ICD-10-CM | POA: Diagnosis not present

## 2019-11-16 DIAGNOSIS — M1A9XX1 Chronic gout, unspecified, with tophus (tophi): Secondary | ICD-10-CM | POA: Diagnosis not present

## 2019-11-16 DIAGNOSIS — I5042 Chronic combined systolic (congestive) and diastolic (congestive) heart failure: Secondary | ICD-10-CM | POA: Diagnosis not present

## 2019-11-16 DIAGNOSIS — M0589 Other rheumatoid arthritis with rheumatoid factor of multiple sites: Secondary | ICD-10-CM | POA: Diagnosis not present

## 2019-11-16 DIAGNOSIS — I4892 Unspecified atrial flutter: Secondary | ICD-10-CM | POA: Diagnosis not present

## 2019-11-16 DIAGNOSIS — K5790 Diverticulosis of intestine, part unspecified, without perforation or abscess without bleeding: Secondary | ICD-10-CM | POA: Diagnosis not present

## 2019-11-16 DIAGNOSIS — G8929 Other chronic pain: Secondary | ICD-10-CM | POA: Diagnosis not present

## 2019-11-16 DIAGNOSIS — I130 Hypertensive heart and chronic kidney disease with heart failure and stage 1 through stage 4 chronic kidney disease, or unspecified chronic kidney disease: Secondary | ICD-10-CM | POA: Diagnosis not present

## 2019-11-16 DIAGNOSIS — N183 Chronic kidney disease, stage 3 unspecified: Secondary | ICD-10-CM | POA: Diagnosis not present

## 2019-11-16 DIAGNOSIS — M48061 Spinal stenosis, lumbar region without neurogenic claudication: Secondary | ICD-10-CM | POA: Diagnosis not present

## 2019-11-16 DIAGNOSIS — I420 Dilated cardiomyopathy: Secondary | ICD-10-CM | POA: Diagnosis not present

## 2019-11-16 DIAGNOSIS — G931 Anoxic brain damage, not elsewhere classified: Secondary | ICD-10-CM | POA: Diagnosis not present

## 2019-11-16 DIAGNOSIS — H538 Other visual disturbances: Secondary | ICD-10-CM | POA: Diagnosis not present

## 2019-11-16 DIAGNOSIS — M7501 Adhesive capsulitis of right shoulder: Secondary | ICD-10-CM | POA: Diagnosis not present

## 2019-11-16 DIAGNOSIS — Z86718 Personal history of other venous thrombosis and embolism: Secondary | ICD-10-CM | POA: Diagnosis not present

## 2019-11-16 DIAGNOSIS — R1312 Dysphagia, oropharyngeal phase: Secondary | ICD-10-CM | POA: Diagnosis not present

## 2019-11-21 DIAGNOSIS — K5790 Diverticulosis of intestine, part unspecified, without perforation or abscess without bleeding: Secondary | ICD-10-CM | POA: Diagnosis not present

## 2019-11-21 DIAGNOSIS — Z86718 Personal history of other venous thrombosis and embolism: Secondary | ICD-10-CM | POA: Diagnosis not present

## 2019-11-21 DIAGNOSIS — E1122 Type 2 diabetes mellitus with diabetic chronic kidney disease: Secondary | ICD-10-CM | POA: Diagnosis not present

## 2019-11-21 DIAGNOSIS — R1312 Dysphagia, oropharyngeal phase: Secondary | ICD-10-CM | POA: Diagnosis not present

## 2019-11-21 DIAGNOSIS — M0589 Other rheumatoid arthritis with rheumatoid factor of multiple sites: Secondary | ICD-10-CM | POA: Diagnosis not present

## 2019-11-21 DIAGNOSIS — I5042 Chronic combined systolic (congestive) and diastolic (congestive) heart failure: Secondary | ICD-10-CM | POA: Diagnosis not present

## 2019-11-21 DIAGNOSIS — G931 Anoxic brain damage, not elsewhere classified: Secondary | ICD-10-CM | POA: Diagnosis not present

## 2019-11-21 DIAGNOSIS — I420 Dilated cardiomyopathy: Secondary | ICD-10-CM | POA: Diagnosis not present

## 2019-11-21 DIAGNOSIS — M1A9XX1 Chronic gout, unspecified, with tophus (tophi): Secondary | ICD-10-CM | POA: Diagnosis not present

## 2019-11-21 DIAGNOSIS — M797 Fibromyalgia: Secondary | ICD-10-CM | POA: Diagnosis not present

## 2019-11-21 DIAGNOSIS — D631 Anemia in chronic kidney disease: Secondary | ICD-10-CM | POA: Diagnosis not present

## 2019-11-21 DIAGNOSIS — N183 Chronic kidney disease, stage 3 unspecified: Secondary | ICD-10-CM | POA: Diagnosis not present

## 2019-11-21 DIAGNOSIS — M48061 Spinal stenosis, lumbar region without neurogenic claudication: Secondary | ICD-10-CM | POA: Diagnosis not present

## 2019-11-21 DIAGNOSIS — I4892 Unspecified atrial flutter: Secondary | ICD-10-CM | POA: Diagnosis not present

## 2019-11-21 DIAGNOSIS — I69398 Other sequelae of cerebral infarction: Secondary | ICD-10-CM | POA: Diagnosis not present

## 2019-11-21 DIAGNOSIS — I130 Hypertensive heart and chronic kidney disease with heart failure and stage 1 through stage 4 chronic kidney disease, or unspecified chronic kidney disease: Secondary | ICD-10-CM | POA: Diagnosis not present

## 2019-11-21 DIAGNOSIS — M7501 Adhesive capsulitis of right shoulder: Secondary | ICD-10-CM | POA: Diagnosis not present

## 2019-11-21 DIAGNOSIS — H538 Other visual disturbances: Secondary | ICD-10-CM | POA: Diagnosis not present

## 2019-11-21 DIAGNOSIS — E1151 Type 2 diabetes mellitus with diabetic peripheral angiopathy without gangrene: Secondary | ICD-10-CM | POA: Diagnosis not present

## 2019-11-21 DIAGNOSIS — G8929 Other chronic pain: Secondary | ICD-10-CM | POA: Diagnosis not present

## 2019-11-21 DIAGNOSIS — M1990 Unspecified osteoarthritis, unspecified site: Secondary | ICD-10-CM | POA: Diagnosis not present

## 2019-11-21 DIAGNOSIS — I2510 Atherosclerotic heart disease of native coronary artery without angina pectoris: Secondary | ICD-10-CM | POA: Diagnosis not present

## 2019-11-23 DIAGNOSIS — Z86718 Personal history of other venous thrombosis and embolism: Secondary | ICD-10-CM | POA: Diagnosis not present

## 2019-11-23 DIAGNOSIS — M48061 Spinal stenosis, lumbar region without neurogenic claudication: Secondary | ICD-10-CM | POA: Diagnosis not present

## 2019-11-23 DIAGNOSIS — G931 Anoxic brain damage, not elsewhere classified: Secondary | ICD-10-CM | POA: Diagnosis not present

## 2019-11-23 DIAGNOSIS — M7501 Adhesive capsulitis of right shoulder: Secondary | ICD-10-CM | POA: Diagnosis not present

## 2019-11-23 DIAGNOSIS — M797 Fibromyalgia: Secondary | ICD-10-CM | POA: Diagnosis not present

## 2019-11-23 DIAGNOSIS — D631 Anemia in chronic kidney disease: Secondary | ICD-10-CM | POA: Diagnosis not present

## 2019-11-23 DIAGNOSIS — N183 Chronic kidney disease, stage 3 unspecified: Secondary | ICD-10-CM | POA: Diagnosis not present

## 2019-11-23 DIAGNOSIS — I4892 Unspecified atrial flutter: Secondary | ICD-10-CM | POA: Diagnosis not present

## 2019-11-23 DIAGNOSIS — I420 Dilated cardiomyopathy: Secondary | ICD-10-CM | POA: Diagnosis not present

## 2019-11-23 DIAGNOSIS — I2510 Atherosclerotic heart disease of native coronary artery without angina pectoris: Secondary | ICD-10-CM | POA: Diagnosis not present

## 2019-11-23 DIAGNOSIS — E1122 Type 2 diabetes mellitus with diabetic chronic kidney disease: Secondary | ICD-10-CM | POA: Diagnosis not present

## 2019-11-23 DIAGNOSIS — K5790 Diverticulosis of intestine, part unspecified, without perforation or abscess without bleeding: Secondary | ICD-10-CM | POA: Diagnosis not present

## 2019-11-23 DIAGNOSIS — M1A9XX1 Chronic gout, unspecified, with tophus (tophi): Secondary | ICD-10-CM | POA: Diagnosis not present

## 2019-11-23 DIAGNOSIS — I5042 Chronic combined systolic (congestive) and diastolic (congestive) heart failure: Secondary | ICD-10-CM | POA: Diagnosis not present

## 2019-11-23 DIAGNOSIS — R1312 Dysphagia, oropharyngeal phase: Secondary | ICD-10-CM | POA: Diagnosis not present

## 2019-11-23 DIAGNOSIS — E1151 Type 2 diabetes mellitus with diabetic peripheral angiopathy without gangrene: Secondary | ICD-10-CM | POA: Diagnosis not present

## 2019-11-23 DIAGNOSIS — H538 Other visual disturbances: Secondary | ICD-10-CM | POA: Diagnosis not present

## 2019-11-23 DIAGNOSIS — M0589 Other rheumatoid arthritis with rheumatoid factor of multiple sites: Secondary | ICD-10-CM | POA: Diagnosis not present

## 2019-11-23 DIAGNOSIS — I130 Hypertensive heart and chronic kidney disease with heart failure and stage 1 through stage 4 chronic kidney disease, or unspecified chronic kidney disease: Secondary | ICD-10-CM | POA: Diagnosis not present

## 2019-11-23 DIAGNOSIS — I69398 Other sequelae of cerebral infarction: Secondary | ICD-10-CM | POA: Diagnosis not present

## 2019-11-23 DIAGNOSIS — M1990 Unspecified osteoarthritis, unspecified site: Secondary | ICD-10-CM | POA: Diagnosis not present

## 2019-11-23 DIAGNOSIS — G8929 Other chronic pain: Secondary | ICD-10-CM | POA: Diagnosis not present

## 2019-11-28 ENCOUNTER — Encounter: Payer: Self-pay | Admitting: Family Medicine

## 2019-11-28 DIAGNOSIS — H538 Other visual disturbances: Secondary | ICD-10-CM | POA: Diagnosis not present

## 2019-11-28 DIAGNOSIS — I420 Dilated cardiomyopathy: Secondary | ICD-10-CM | POA: Diagnosis not present

## 2019-11-28 DIAGNOSIS — I130 Hypertensive heart and chronic kidney disease with heart failure and stage 1 through stage 4 chronic kidney disease, or unspecified chronic kidney disease: Secondary | ICD-10-CM | POA: Diagnosis not present

## 2019-11-28 DIAGNOSIS — D631 Anemia in chronic kidney disease: Secondary | ICD-10-CM | POA: Diagnosis not present

## 2019-11-28 DIAGNOSIS — G8929 Other chronic pain: Secondary | ICD-10-CM | POA: Diagnosis not present

## 2019-11-28 DIAGNOSIS — M7501 Adhesive capsulitis of right shoulder: Secondary | ICD-10-CM | POA: Diagnosis not present

## 2019-11-28 DIAGNOSIS — I69398 Other sequelae of cerebral infarction: Secondary | ICD-10-CM | POA: Diagnosis not present

## 2019-11-28 DIAGNOSIS — I5042 Chronic combined systolic (congestive) and diastolic (congestive) heart failure: Secondary | ICD-10-CM | POA: Diagnosis not present

## 2019-11-28 DIAGNOSIS — M1A9XX1 Chronic gout, unspecified, with tophus (tophi): Secondary | ICD-10-CM | POA: Diagnosis not present

## 2019-11-28 DIAGNOSIS — N183 Chronic kidney disease, stage 3 unspecified: Secondary | ICD-10-CM | POA: Diagnosis not present

## 2019-11-28 DIAGNOSIS — M48061 Spinal stenosis, lumbar region without neurogenic claudication: Secondary | ICD-10-CM | POA: Diagnosis not present

## 2019-11-28 DIAGNOSIS — E1151 Type 2 diabetes mellitus with diabetic peripheral angiopathy without gangrene: Secondary | ICD-10-CM | POA: Diagnosis not present

## 2019-11-28 DIAGNOSIS — I2510 Atherosclerotic heart disease of native coronary artery without angina pectoris: Secondary | ICD-10-CM | POA: Diagnosis not present

## 2019-11-28 DIAGNOSIS — M1990 Unspecified osteoarthritis, unspecified site: Secondary | ICD-10-CM | POA: Diagnosis not present

## 2019-11-28 DIAGNOSIS — G931 Anoxic brain damage, not elsewhere classified: Secondary | ICD-10-CM | POA: Diagnosis not present

## 2019-11-28 DIAGNOSIS — M0589 Other rheumatoid arthritis with rheumatoid factor of multiple sites: Secondary | ICD-10-CM | POA: Diagnosis not present

## 2019-11-28 DIAGNOSIS — M797 Fibromyalgia: Secondary | ICD-10-CM | POA: Diagnosis not present

## 2019-11-28 DIAGNOSIS — Z86718 Personal history of other venous thrombosis and embolism: Secondary | ICD-10-CM | POA: Diagnosis not present

## 2019-11-28 DIAGNOSIS — K5790 Diverticulosis of intestine, part unspecified, without perforation or abscess without bleeding: Secondary | ICD-10-CM | POA: Diagnosis not present

## 2019-11-28 DIAGNOSIS — I4892 Unspecified atrial flutter: Secondary | ICD-10-CM | POA: Diagnosis not present

## 2019-11-28 DIAGNOSIS — E1122 Type 2 diabetes mellitus with diabetic chronic kidney disease: Secondary | ICD-10-CM | POA: Diagnosis not present

## 2019-11-28 DIAGNOSIS — R1312 Dysphagia, oropharyngeal phase: Secondary | ICD-10-CM | POA: Diagnosis not present

## 2019-11-30 DIAGNOSIS — G8929 Other chronic pain: Secondary | ICD-10-CM | POA: Diagnosis not present

## 2019-11-30 DIAGNOSIS — M1990 Unspecified osteoarthritis, unspecified site: Secondary | ICD-10-CM | POA: Diagnosis not present

## 2019-11-30 DIAGNOSIS — M48061 Spinal stenosis, lumbar region without neurogenic claudication: Secondary | ICD-10-CM | POA: Diagnosis not present

## 2019-11-30 DIAGNOSIS — N183 Chronic kidney disease, stage 3 unspecified: Secondary | ICD-10-CM | POA: Diagnosis not present

## 2019-11-30 DIAGNOSIS — Z86718 Personal history of other venous thrombosis and embolism: Secondary | ICD-10-CM | POA: Diagnosis not present

## 2019-11-30 DIAGNOSIS — G931 Anoxic brain damage, not elsewhere classified: Secondary | ICD-10-CM | POA: Diagnosis not present

## 2019-11-30 DIAGNOSIS — I420 Dilated cardiomyopathy: Secondary | ICD-10-CM | POA: Diagnosis not present

## 2019-11-30 DIAGNOSIS — I4892 Unspecified atrial flutter: Secondary | ICD-10-CM | POA: Diagnosis not present

## 2019-11-30 DIAGNOSIS — I5042 Chronic combined systolic (congestive) and diastolic (congestive) heart failure: Secondary | ICD-10-CM | POA: Diagnosis not present

## 2019-11-30 DIAGNOSIS — I69398 Other sequelae of cerebral infarction: Secondary | ICD-10-CM | POA: Diagnosis not present

## 2019-11-30 DIAGNOSIS — D631 Anemia in chronic kidney disease: Secondary | ICD-10-CM | POA: Diagnosis not present

## 2019-11-30 DIAGNOSIS — R1312 Dysphagia, oropharyngeal phase: Secondary | ICD-10-CM | POA: Diagnosis not present

## 2019-11-30 DIAGNOSIS — M1A9XX1 Chronic gout, unspecified, with tophus (tophi): Secondary | ICD-10-CM | POA: Diagnosis not present

## 2019-11-30 DIAGNOSIS — E1151 Type 2 diabetes mellitus with diabetic peripheral angiopathy without gangrene: Secondary | ICD-10-CM | POA: Diagnosis not present

## 2019-11-30 DIAGNOSIS — I130 Hypertensive heart and chronic kidney disease with heart failure and stage 1 through stage 4 chronic kidney disease, or unspecified chronic kidney disease: Secondary | ICD-10-CM | POA: Diagnosis not present

## 2019-11-30 DIAGNOSIS — M797 Fibromyalgia: Secondary | ICD-10-CM | POA: Diagnosis not present

## 2019-11-30 DIAGNOSIS — M0589 Other rheumatoid arthritis with rheumatoid factor of multiple sites: Secondary | ICD-10-CM | POA: Diagnosis not present

## 2019-11-30 DIAGNOSIS — E1122 Type 2 diabetes mellitus with diabetic chronic kidney disease: Secondary | ICD-10-CM | POA: Diagnosis not present

## 2019-11-30 DIAGNOSIS — I2510 Atherosclerotic heart disease of native coronary artery without angina pectoris: Secondary | ICD-10-CM | POA: Diagnosis not present

## 2019-11-30 DIAGNOSIS — M7501 Adhesive capsulitis of right shoulder: Secondary | ICD-10-CM | POA: Diagnosis not present

## 2019-11-30 DIAGNOSIS — K5790 Diverticulosis of intestine, part unspecified, without perforation or abscess without bleeding: Secondary | ICD-10-CM | POA: Diagnosis not present

## 2019-11-30 DIAGNOSIS — H538 Other visual disturbances: Secondary | ICD-10-CM | POA: Diagnosis not present

## 2019-12-01 DIAGNOSIS — M4800 Spinal stenosis, site unspecified: Secondary | ICD-10-CM | POA: Diagnosis not present

## 2019-12-04 DIAGNOSIS — D631 Anemia in chronic kidney disease: Secondary | ICD-10-CM | POA: Diagnosis not present

## 2019-12-04 DIAGNOSIS — E1122 Type 2 diabetes mellitus with diabetic chronic kidney disease: Secondary | ICD-10-CM | POA: Diagnosis not present

## 2019-12-04 DIAGNOSIS — N183 Chronic kidney disease, stage 3 unspecified: Secondary | ICD-10-CM | POA: Diagnosis not present

## 2019-12-04 DIAGNOSIS — Z86718 Personal history of other venous thrombosis and embolism: Secondary | ICD-10-CM | POA: Diagnosis not present

## 2019-12-04 DIAGNOSIS — I69398 Other sequelae of cerebral infarction: Secondary | ICD-10-CM | POA: Diagnosis not present

## 2019-12-04 DIAGNOSIS — I4892 Unspecified atrial flutter: Secondary | ICD-10-CM | POA: Diagnosis not present

## 2019-12-04 DIAGNOSIS — M48061 Spinal stenosis, lumbar region without neurogenic claudication: Secondary | ICD-10-CM | POA: Diagnosis not present

## 2019-12-04 DIAGNOSIS — G8929 Other chronic pain: Secondary | ICD-10-CM | POA: Diagnosis not present

## 2019-12-04 DIAGNOSIS — I2510 Atherosclerotic heart disease of native coronary artery without angina pectoris: Secondary | ICD-10-CM | POA: Diagnosis not present

## 2019-12-04 DIAGNOSIS — I5042 Chronic combined systolic (congestive) and diastolic (congestive) heart failure: Secondary | ICD-10-CM | POA: Diagnosis not present

## 2019-12-04 DIAGNOSIS — M1990 Unspecified osteoarthritis, unspecified site: Secondary | ICD-10-CM | POA: Diagnosis not present

## 2019-12-04 DIAGNOSIS — M797 Fibromyalgia: Secondary | ICD-10-CM | POA: Diagnosis not present

## 2019-12-04 DIAGNOSIS — H538 Other visual disturbances: Secondary | ICD-10-CM | POA: Diagnosis not present

## 2019-12-04 DIAGNOSIS — M0589 Other rheumatoid arthritis with rheumatoid factor of multiple sites: Secondary | ICD-10-CM | POA: Diagnosis not present

## 2019-12-04 DIAGNOSIS — I420 Dilated cardiomyopathy: Secondary | ICD-10-CM | POA: Diagnosis not present

## 2019-12-04 DIAGNOSIS — R1312 Dysphagia, oropharyngeal phase: Secondary | ICD-10-CM | POA: Diagnosis not present

## 2019-12-04 DIAGNOSIS — I130 Hypertensive heart and chronic kidney disease with heart failure and stage 1 through stage 4 chronic kidney disease, or unspecified chronic kidney disease: Secondary | ICD-10-CM | POA: Diagnosis not present

## 2019-12-04 DIAGNOSIS — K5790 Diverticulosis of intestine, part unspecified, without perforation or abscess without bleeding: Secondary | ICD-10-CM | POA: Diagnosis not present

## 2019-12-04 DIAGNOSIS — E1151 Type 2 diabetes mellitus with diabetic peripheral angiopathy without gangrene: Secondary | ICD-10-CM | POA: Diagnosis not present

## 2019-12-04 DIAGNOSIS — M1A9XX1 Chronic gout, unspecified, with tophus (tophi): Secondary | ICD-10-CM | POA: Diagnosis not present

## 2019-12-04 DIAGNOSIS — M7501 Adhesive capsulitis of right shoulder: Secondary | ICD-10-CM | POA: Diagnosis not present

## 2019-12-04 DIAGNOSIS — G931 Anoxic brain damage, not elsewhere classified: Secondary | ICD-10-CM | POA: Diagnosis not present

## 2019-12-07 DIAGNOSIS — M797 Fibromyalgia: Secondary | ICD-10-CM | POA: Diagnosis not present

## 2019-12-07 DIAGNOSIS — K5790 Diverticulosis of intestine, part unspecified, without perforation or abscess without bleeding: Secondary | ICD-10-CM | POA: Diagnosis not present

## 2019-12-07 DIAGNOSIS — D631 Anemia in chronic kidney disease: Secondary | ICD-10-CM | POA: Diagnosis not present

## 2019-12-07 DIAGNOSIS — E1151 Type 2 diabetes mellitus with diabetic peripheral angiopathy without gangrene: Secondary | ICD-10-CM | POA: Diagnosis not present

## 2019-12-07 DIAGNOSIS — M1990 Unspecified osteoarthritis, unspecified site: Secondary | ICD-10-CM | POA: Diagnosis not present

## 2019-12-07 DIAGNOSIS — M7501 Adhesive capsulitis of right shoulder: Secondary | ICD-10-CM | POA: Diagnosis not present

## 2019-12-07 DIAGNOSIS — Z86718 Personal history of other venous thrombosis and embolism: Secondary | ICD-10-CM | POA: Diagnosis not present

## 2019-12-07 DIAGNOSIS — M48061 Spinal stenosis, lumbar region without neurogenic claudication: Secondary | ICD-10-CM | POA: Diagnosis not present

## 2019-12-07 DIAGNOSIS — I4892 Unspecified atrial flutter: Secondary | ICD-10-CM | POA: Diagnosis not present

## 2019-12-07 DIAGNOSIS — I2510 Atherosclerotic heart disease of native coronary artery without angina pectoris: Secondary | ICD-10-CM | POA: Diagnosis not present

## 2019-12-07 DIAGNOSIS — G931 Anoxic brain damage, not elsewhere classified: Secondary | ICD-10-CM | POA: Diagnosis not present

## 2019-12-07 DIAGNOSIS — E1122 Type 2 diabetes mellitus with diabetic chronic kidney disease: Secondary | ICD-10-CM | POA: Diagnosis not present

## 2019-12-07 DIAGNOSIS — M1A9XX1 Chronic gout, unspecified, with tophus (tophi): Secondary | ICD-10-CM | POA: Diagnosis not present

## 2019-12-07 DIAGNOSIS — I130 Hypertensive heart and chronic kidney disease with heart failure and stage 1 through stage 4 chronic kidney disease, or unspecified chronic kidney disease: Secondary | ICD-10-CM | POA: Diagnosis not present

## 2019-12-07 DIAGNOSIS — G8929 Other chronic pain: Secondary | ICD-10-CM | POA: Diagnosis not present

## 2019-12-07 DIAGNOSIS — I420 Dilated cardiomyopathy: Secondary | ICD-10-CM | POA: Diagnosis not present

## 2019-12-07 DIAGNOSIS — I5042 Chronic combined systolic (congestive) and diastolic (congestive) heart failure: Secondary | ICD-10-CM | POA: Diagnosis not present

## 2019-12-07 DIAGNOSIS — H538 Other visual disturbances: Secondary | ICD-10-CM | POA: Diagnosis not present

## 2019-12-07 DIAGNOSIS — I69398 Other sequelae of cerebral infarction: Secondary | ICD-10-CM | POA: Diagnosis not present

## 2019-12-07 DIAGNOSIS — N183 Chronic kidney disease, stage 3 unspecified: Secondary | ICD-10-CM | POA: Diagnosis not present

## 2019-12-07 DIAGNOSIS — R1312 Dysphagia, oropharyngeal phase: Secondary | ICD-10-CM | POA: Diagnosis not present

## 2019-12-07 DIAGNOSIS — M0589 Other rheumatoid arthritis with rheumatoid factor of multiple sites: Secondary | ICD-10-CM | POA: Diagnosis not present

## 2019-12-08 ENCOUNTER — Encounter: Payer: Self-pay | Admitting: Family Medicine

## 2019-12-11 DIAGNOSIS — L88 Pyoderma gangrenosum: Secondary | ICD-10-CM | POA: Diagnosis not present

## 2019-12-11 DIAGNOSIS — M259 Joint disorder, unspecified: Secondary | ICD-10-CM | POA: Diagnosis not present

## 2019-12-11 DIAGNOSIS — I10 Essential (primary) hypertension: Secondary | ICD-10-CM | POA: Diagnosis not present

## 2019-12-11 DIAGNOSIS — L97212 Non-pressure chronic ulcer of right calf with fat layer exposed: Secondary | ICD-10-CM | POA: Diagnosis not present

## 2019-12-12 ENCOUNTER — Encounter (HOSPITAL_COMMUNITY): Payer: Self-pay | Admitting: Emergency Medicine

## 2019-12-12 ENCOUNTER — Observation Stay (HOSPITAL_COMMUNITY)
Admission: EM | Admit: 2019-12-12 | Discharge: 2019-12-13 | Disposition: A | Discharge status: Home / Self Care | Payer: Medicare Other | Attending: Family Medicine | Admitting: Family Medicine

## 2019-12-12 ENCOUNTER — Other Ambulatory Visit: Payer: Self-pay

## 2019-12-12 DIAGNOSIS — E860 Dehydration: Secondary | ICD-10-CM | POA: Diagnosis not present

## 2019-12-12 DIAGNOSIS — I420 Dilated cardiomyopathy: Secondary | ICD-10-CM | POA: Diagnosis not present

## 2019-12-12 DIAGNOSIS — Z86711 Personal history of pulmonary embolism: Secondary | ICD-10-CM | POA: Insufficient documentation

## 2019-12-12 DIAGNOSIS — Z8673 Personal history of transient ischemic attack (TIA), and cerebral infarction without residual deficits: Secondary | ICD-10-CM

## 2019-12-12 DIAGNOSIS — I2510 Atherosclerotic heart disease of native coronary artery without angina pectoris: Secondary | ICD-10-CM | POA: Insufficient documentation

## 2019-12-12 DIAGNOSIS — Z7901 Long term (current) use of anticoagulants: Secondary | ICD-10-CM | POA: Diagnosis not present

## 2019-12-12 DIAGNOSIS — M48061 Spinal stenosis, lumbar region without neurogenic claudication: Secondary | ICD-10-CM | POA: Diagnosis not present

## 2019-12-12 DIAGNOSIS — M797 Fibromyalgia: Secondary | ICD-10-CM | POA: Insufficient documentation

## 2019-12-12 DIAGNOSIS — R61 Generalized hyperhidrosis: Secondary | ICD-10-CM | POA: Diagnosis not present

## 2019-12-12 DIAGNOSIS — I69398 Other sequelae of cerebral infarction: Secondary | ICD-10-CM | POA: Diagnosis not present

## 2019-12-12 DIAGNOSIS — Z8616 Personal history of COVID-19: Secondary | ICD-10-CM | POA: Diagnosis not present

## 2019-12-12 DIAGNOSIS — D638 Anemia in other chronic diseases classified elsewhere: Secondary | ICD-10-CM | POA: Diagnosis present

## 2019-12-12 DIAGNOSIS — I428 Other cardiomyopathies: Secondary | ICD-10-CM | POA: Diagnosis not present

## 2019-12-12 DIAGNOSIS — N183 Chronic kidney disease, stage 3 unspecified: Secondary | ICD-10-CM | POA: Insufficient documentation

## 2019-12-12 DIAGNOSIS — G9340 Encephalopathy, unspecified: Secondary | ICD-10-CM | POA: Insufficient documentation

## 2019-12-12 DIAGNOSIS — Z7952 Long term (current) use of systemic steroids: Secondary | ICD-10-CM | POA: Diagnosis not present

## 2019-12-12 DIAGNOSIS — Z7984 Long term (current) use of oral hypoglycemic drugs: Secondary | ICD-10-CM | POA: Diagnosis not present

## 2019-12-12 DIAGNOSIS — Z79899 Other long term (current) drug therapy: Secondary | ICD-10-CM | POA: Insufficient documentation

## 2019-12-12 DIAGNOSIS — I5042 Chronic combined systolic (congestive) and diastolic (congestive) heart failure: Secondary | ICD-10-CM | POA: Diagnosis not present

## 2019-12-12 DIAGNOSIS — D631 Anemia in chronic kidney disease: Secondary | ICD-10-CM | POA: Diagnosis not present

## 2019-12-12 DIAGNOSIS — E1159 Type 2 diabetes mellitus with other circulatory complications: Secondary | ICD-10-CM | POA: Diagnosis not present

## 2019-12-12 DIAGNOSIS — Z881 Allergy status to other antibiotic agents status: Secondary | ICD-10-CM | POA: Insufficient documentation

## 2019-12-12 DIAGNOSIS — Z03818 Encounter for observation for suspected exposure to other biological agents ruled out: Secondary | ICD-10-CM | POA: Diagnosis not present

## 2019-12-12 DIAGNOSIS — M7501 Adhesive capsulitis of right shoulder: Secondary | ICD-10-CM | POA: Diagnosis not present

## 2019-12-12 DIAGNOSIS — R55 Syncope and collapse: Principal | ICD-10-CM | POA: Insufficient documentation

## 2019-12-12 DIAGNOSIS — I4892 Unspecified atrial flutter: Secondary | ICD-10-CM | POA: Diagnosis not present

## 2019-12-12 DIAGNOSIS — R0602 Shortness of breath: Secondary | ICD-10-CM

## 2019-12-12 DIAGNOSIS — K5790 Diverticulosis of intestine, part unspecified, without perforation or abscess without bleeding: Secondary | ICD-10-CM | POA: Diagnosis not present

## 2019-12-12 DIAGNOSIS — G8929 Other chronic pain: Secondary | ICD-10-CM | POA: Diagnosis not present

## 2019-12-12 DIAGNOSIS — Z86718 Personal history of other venous thrombosis and embolism: Secondary | ICD-10-CM | POA: Diagnosis not present

## 2019-12-12 DIAGNOSIS — E1122 Type 2 diabetes mellitus with diabetic chronic kidney disease: Secondary | ICD-10-CM | POA: Diagnosis not present

## 2019-12-12 DIAGNOSIS — Z8249 Family history of ischemic heart disease and other diseases of the circulatory system: Secondary | ICD-10-CM | POA: Insufficient documentation

## 2019-12-12 DIAGNOSIS — E1151 Type 2 diabetes mellitus with diabetic peripheral angiopathy without gangrene: Secondary | ICD-10-CM | POA: Diagnosis not present

## 2019-12-12 DIAGNOSIS — E785 Hyperlipidemia, unspecified: Secondary | ICD-10-CM | POA: Insufficient documentation

## 2019-12-12 DIAGNOSIS — M069 Rheumatoid arthritis, unspecified: Secondary | ICD-10-CM | POA: Insufficient documentation

## 2019-12-12 DIAGNOSIS — G931 Anoxic brain damage, not elsewhere classified: Secondary | ICD-10-CM | POA: Diagnosis not present

## 2019-12-12 DIAGNOSIS — Z885 Allergy status to narcotic agent status: Secondary | ICD-10-CM | POA: Insufficient documentation

## 2019-12-12 DIAGNOSIS — I959 Hypotension, unspecified: Secondary | ICD-10-CM | POA: Diagnosis not present

## 2019-12-12 DIAGNOSIS — M1A09X1 Idiopathic chronic gout, multiple sites, with tophus (tophi): Secondary | ICD-10-CM | POA: Diagnosis present

## 2019-12-12 DIAGNOSIS — I130 Hypertensive heart and chronic kidney disease with heart failure and stage 1 through stage 4 chronic kidney disease, or unspecified chronic kidney disease: Secondary | ICD-10-CM | POA: Diagnosis not present

## 2019-12-12 DIAGNOSIS — N179 Acute kidney failure, unspecified: Secondary | ICD-10-CM | POA: Diagnosis not present

## 2019-12-12 DIAGNOSIS — H538 Other visual disturbances: Secondary | ICD-10-CM | POA: Diagnosis not present

## 2019-12-12 DIAGNOSIS — Z20822 Contact with and (suspected) exposure to covid-19: Secondary | ICD-10-CM | POA: Insufficient documentation

## 2019-12-12 DIAGNOSIS — M1990 Unspecified osteoarthritis, unspecified site: Secondary | ICD-10-CM | POA: Diagnosis not present

## 2019-12-12 DIAGNOSIS — M1A9XX1 Chronic gout, unspecified, with tophus (tophi): Secondary | ICD-10-CM | POA: Diagnosis not present

## 2019-12-12 DIAGNOSIS — Z743 Need for continuous supervision: Secondary | ICD-10-CM | POA: Diagnosis not present

## 2019-12-12 DIAGNOSIS — M0589 Other rheumatoid arthritis with rheumatoid factor of multiple sites: Secondary | ICD-10-CM | POA: Diagnosis not present

## 2019-12-12 DIAGNOSIS — R1312 Dysphagia, oropharyngeal phase: Secondary | ICD-10-CM | POA: Diagnosis not present

## 2019-12-12 DIAGNOSIS — E1169 Type 2 diabetes mellitus with other specified complication: Secondary | ICD-10-CM | POA: Diagnosis present

## 2019-12-12 MED ORDER — SODIUM CHLORIDE 0.9 % IV BOLUS
500.0000 mL | Freq: Once | INTRAVENOUS | Status: AC
  Administered 2019-12-12: 500 mL via INTRAVENOUS

## 2019-12-12 NOTE — ED Notes (Signed)
RN attempted IV, unsuccessful.  °

## 2019-12-12 NOTE — ED Triage Notes (Signed)
Per EMS, Pt from home, was sitting in chair became diaphoretic and SOB and felt like she was going to "pass out," which lasted 5-10 minutes according to daughter.  EMS stated she was orthostatic, lying 118/78 and sitting 79/40. Happened before - was dehydrated.  No n/v/d,fevers.  Was given 150 NS in route.

## 2019-12-12 NOTE — ED Provider Notes (Signed)
[facility] EMERGENCY DEPARTMENT Provider Note   CSN: [id number] Arrival date & time: 12/12/19  2052     History Chief Complaint  Patient presents with  . Near Syncope    [name] [name] is a 86 y.o. female.  Patient is an 86 year old female with past medical history of congestive heart failure, coronary artery disease, hyperlipidemia, hypertension, pulmonary embolism, diabetes.  She presents today for evaluation of near syncope.  According to the patient, she was at home talking with a family member when she became short of breath, lightheaded, and felt as if she was going to pass out.  This episode lasted for approximately 5 to 10 minutes, then resolved.  EMS reports orthostatic blood pressures.  This has apparently happened in the past in the setting of dehydration.  Patient denies any complaints at present.  She denies fevers or chills.  She denies chest pain, difficulty breathing.  She denies abdominal pain, vomiting, or diarrhea.  The history is provided by the patient.  Near Syncope This is a new problem. The current episode started 1 to 2 hours ago. The problem occurs constantly. The problem has been resolved. Pertinent negatives include no chest pain, no headaches and no shortness of breath. Nothing aggravates the symptoms. Nothing relieves the symptoms. She has tried nothing for the symptoms.       Past Medical History:  Diagnosis Date  . Allergic rhinitis due to pollen 04/27/2007  . Arthritis    "in q joint" (08/07/2013)  . CHF (congestive heart failure) ([place])   . Coronary atherosclerosis of native coronary artery   . Cough   . Edema 05/03/2013  . Exertional shortness of breath    "sometimes" (08/07/2013)  . First degree atrioventricular block   . Gout, unspecified 04/19/2013  . Hyperlipidemia 04/27/2007  . Hypertension   . Midline low back pain without sciatica 06/27/2014  . Muscle spasm   . Muscle weakness (generalized)   . Onychia and paronychia  of toe   . Osteoarthrosis, unspecified whether generalized or localized, unspecified site 04/27/2007  . Other fall   . Other malaise and fatigue   . Pneumonia 05/2018  . Prepatellar bursitis   . Pulmonary embolism ([place]) 07/2018  . Rheumatic nodule ([place]) 06/28/2017  . Seizures ([place])   . Stroke ([place]) 01/17/2014  . Type II diabetes mellitus ([place])    "fasting 90-110s" (08/07/2013)  . Unspecified vitamin D deficiency     Patient Active Problem List   Diagnosis Date Noted  . Pre-operative clearance 05/29/2019  . Volume overload 05/29/2019  . Acute metabolic encephalopathy 27/74/1287  . Pressure injury of skin 01/12/2019  . AMS (altered mental status) 01/09/2019  . Rheumatoid arthritis flare ([place]) 12/23/2018  . Weakness of right lower extremity 12/23/2018  . Abscess of right upper extremity 12/14/2018  . Fever 12/13/2018  . Hand swelling 12/02/2018  . Pain, wrist 12/02/2018  . Hand pain 12/02/2018  . Polyarthritis 12/02/2018  . Spinal stenosis, lumbar 11/19/2018  . Acute encephalopathy 11/19/2018  . History of CVA (cerebrovascular accident) 11/19/2018  . Acute gout 11/15/2018  . Low back pain 11/14/2018  . Chronic back pain 10/27/2018  . Arthralgia of both knees 10/27/2018  . Rheumatoid arthritis involving both knees ([place]) 10/27/2018  . Acute pain 10/21/2018  . Physical deconditioning 09/06/2018  . Hyperlipidemia associated with type 2 diabetes mellitus ([place]), refuses statin 09/06/2018  . Refusal of statin medication by patient 09/04/2018  . Deep vein thrombosis (DVT) of distal vein of right  lower extremity ([place]) 07/30/2018  . Hypertensive heart disease with heart failure ([place]) 07/30/2018  . Pulmonary embolism ([place]), 07/2018, on Xarelto with goal to DC after 6 months if more active 07/10/2018  . Elevated brain natriuretic peptide (BNP) level   . DCM (dilated cardiomyopathy) ([place])   . Benign hypertensive heart and kidney disease with CHF and stage 3 chronic kidney disease ([place])  05/28/2018  . Osteoarthritis 05/06/2018  . Hypokalemia 02/02/2018  . Adhesive capsulitis of right shoulder 10/12/2017  . Idiopathic chronic gout of multiple sites with tophus 08/25/2017  . History of total knee arthroplasty, bilateral 02/18/2017  . History of rotator cuff surgery 02/18/2017  . High risk medications, long-term use 09/21/2016  . Chronic combined systolic and diastolic CHF (congestive heart failure) ([place]) 09/12/2015  . Bilateral edema of lower extremity 06/05/2014  . CKD stage 3 due to type 2 diabetes mellitus ([place]) 06/05/2014  . DM (diabetes mellitus), type 2 with renal complications ([place]) 68/10/7516    Class: Chronic  . Anemia of chronic disease 05/01/2014  . CAD (coronary artery disease) 10/18/2013  . Hypertension associated with diabetes ([place]) 08/08/2013    Class: Chronic  . Fibromyalgia 05/10/2013  . Type 2 diabetes mellitus with hyperlipidemia ([place]), patient declines statin 04/27/2007  . Allergic rhinitis 04/27/2007  . Diverticulosis 04/27/2007    Past Surgical History:  Procedure Laterality Date  . ABDOMINAL HYSTERECTOMY  04/1980  . APPENDECTOMY  1960  . CARDIAC CATHETERIZATION  2003  . CATARACT EXTRACTION W/ INTRAOCULAR LENS  IMPLANT, BILATERAL Bilateral ~ 2012  . CHOLECYSTECTOMY N/A 06/26/2018   Procedure: LAPAROSCOPIC CHOLECYSTECTOMY POSSIBLE INTRAOPERATIVE CHOLANGIOGRAM;  Surgeon: Coralie Keens, MD;  Location: [place];  Service: General;  Laterality: N/A;  . JOINT REPLACEMENT    . REPLACEMENT TOTAL KNEE Right 09/2005  . RIGHT/LEFT HEART CATH AND CORONARY ANGIOGRAPHY N/A 06/01/2018   Procedure: RIGHT/LEFT HEART CATH AND CORONARY ANGIOGRAPHY;  Surgeon: Jettie Booze, MD;  Location: [place] [facility];  Service: Cardiovascular;  Laterality: N/A;  . SHOULDER OPEN ROTATOR CUFF REPAIR Right 07/1999  . TONSILLECTOMY  08/1974  . TOTAL KNEE ARTHROPLASTY Left 08/07/2013   Procedure: TOTAL KNEE ARTHROPLASTY- LEFT;  Surgeon: Vickey Huger, MD;  Location: [place];   Service: Orthopedics;  Laterality: Left;  . TRANSTHORACIC ECHOCARDIOGRAM  2003   EF 55-65%; mild concentric LVH     OB History   No obstetric history on file.     Family History  Problem Relation Age of Onset  . Heart attack Father     Social History   Tobacco Use  . Smoking status: Never Smoker  . Smokeless tobacco: Never Used  Substance Use Topics  . Alcohol use: No    Alcohol/week: 0.0 standard drinks  . Drug use: No    Home Medications Prior to Admission medications   Medication Sig Start Date End Date Taking? Authorizing Provider  acetaminophen (TYLENOL) 500 MG tablet Take 1,000 mg by mouth every 8 (eight) hours as needed for mild pain.     [provider]  albuterol (PROVENTIL HFA;VENTOLIN HFA) 108 (90 Base) MCG/ACT inhaler Inhale 1-2 puffs into the lungs every 6 (six) hours as needed for wheezing or shortness of breath. 12/20/18   Desiree Hane, MD  allopurinol (ZYLOPRIM) 100 MG tablet TAKE 1 TABLET BY MOUTH EVERY DAY AT BEDTIME Patient taking differently: Take 100 mg by mouth at bedtime.  09/21/19   Cirigliano, Garvin Fila, DO  benzonatate (TESSALON) 100 MG capsule Take 1 capsule (100 mg total) by  mouth 2 (two) times daily as needed for cough. 08/29/19   Luetta Nutting, DO  Blood Glucose Monitoring Suppl (ACCU-CHEK AVIVA PLUS) w/Device KIT Use to check blood sugars 1-2 times daily. 09/06/19   Cirigliano, Mary K, DO  carvedilol (COREG) 25 MG tablet TAKE 1 TABLET BY MOUTH TWICE DAILY Patient taking differently: Take 25 mg by mouth 2 (two) times daily with a meal.  09/21/19   Cirigliano, Mary K, DO  cephALEXin (KEFLEX) 500 MG capsule Take 1 capsule (500 mg total) by mouth 2 (two) times daily. Patient not taking: Reported on 10/30/2019 09/25/19   Ward, Cyril Mourning N, DO  D3-1000 25 MCG (1000 UT) capsule Take 1 capsule (1,000 Units total) by mouth daily. 10/18/19   Cirigliano, Garvin Fila, DO  Dextromethorphan-guaiFENesin (MUCINEX DM MAXIMUM STRENGTH) 60-1200 MG TB12 Take 1  tablet by mouth 2 (two) times daily as needed (cough and congestion).    [provider]  docusate sodium (COLACE) 100 MG capsule Take 1 capsule (100 mg total) by mouth daily as needed for mild constipation. 12/20/18   Desiree Hane, MD  ENSURE (ENSURE) Take 237 mLs by mouth every evening. Patient not taking: Reported on 10/25/2019 12/20/18   Desiree Hane, MD  ENTRESTO 49-51 MG TAKE 1 TABLET BY MOUTH EVERY DAY Patient taking differently: Take 1 tablet by mouth daily.  09/21/19   Cirigliano, Garvin Fila, DO  furosemide (LASIX) 40 MG tablet TAKE 1 TABLET BY MOUTH TWICE DAILY Patient taking differently: Take 40 mg by mouth 2 (two) times daily.  09/21/19   Cirigliano, Garvin Fila, DO  gabapentin (NEURONTIN) 100 MG capsule TAKE 1 CAPSULE BY MOUTH THREE TIMES DAILY Patient taking differently: Take 100 mg by mouth 3 (three) times daily.  09/21/19   Cirigliano, Mary K, DO  glucose blood (ACCU-CHEK AVIVA PLUS) test strip Use to test blood sugars 1-2 times daily. 09/06/19   Cirigliano, Mary K, DO  guaiFENesin-codeine (ROBITUSSIN AC) 100-10 MG/5ML syrup Take 5 mLs by mouth 2 (two) times daily as needed for cough. Patient not taking: Reported on 10/30/2019 09/13/19   Virgel Manifold, MD  hydrALAZINE (APRESOLINE) 25 MG tablet Take 1 tablet (25 mg total) by mouth 3 (three) times daily. 09/28/19   Skeet Latch, MD  hydroxychloroquine (PLAQUENIL) 200 MG tablet Take 1 tablet (200 mg total) by mouth 2 (two) times daily. 12/20/18   Desiree Hane, MD  hydroxypropyl methylcellulose / hypromellose (ISOPTO TEARS / GONIOVISC) 2.5 % ophthalmic solution Place 1 drop into both eyes daily. 12/20/18   Desiree Hane, MD  isosorbide mononitrate (IMDUR) 30 MG 24 hr tablet TAKE 1 TABLET BY MOUTH EVERY DAY Patient taking differently: Take 30 mg by mouth daily.  09/21/19   Cirigliano, Mary K, DO  JANUVIA 25 MG tablet TAKE 1/2 TABLET BY MOUTH(12,5 MG) EVERY DAY Patient taking differently: Take 12.5 mg by mouth daily.   09/21/19   Cirigliano, Garvin Fila, DO  Lancets (ACCU-CHEK SOFT TOUCH) lancets Use to check blood sugars 1-2 times daily. 09/06/19   Cirigliano, Garvin Fila, DO  leflunomide (ARAVA) 20 MG tablet Take 1 tablet (20 mg total) by mouth daily. Patient not taking: Reported on 10/30/2019 12/20/18   Desiree Hane, MD  LORazepam (ATIVAN) 0.5 MG tablet Take 0.25 mg by mouth as needed for anxiety.     [provider]  Olopatadine HCl 0.2 % SOLN Place 1 drop into both eyes daily. 11/15/19   Ronnald Nian, DO  Omega 3-6-9 Fatty Acids (OMEGA 3-6-9 COMPLEX)  CAPS Take 1 capsule by mouth daily. Omega 3 Black    [provider]  potassium chloride SA (K-DUR) 20 MEQ tablet Take 20 mEq by mouth See admin instructions. Monday and Thursday    [provider]  predniSONE (DELTASONE) 5 MG tablet Take 7.5 mg by mouth daily with breakfast. Taking 1 & 1/2 tabs = 7.22m    [provider]  Probiotic CAPS Take 1 capsule by mouth daily. Patient not taking: Reported on 10/30/2019 09/25/19   Ward, KDelice Bison DO  saccharomyces boulardii (FLORASTOR) 250 MG capsule Take 250 mg by mouth every other day.    [provider]  traMADol (ULTRAM) 50 MG tablet TAKE 1 TO 2 TABLETS BY MOUTH EVERY 6 HOURS AS NEEDED FOR PAIN Patient taking differently: Take 50-100 mg by mouth every 6 (six) hours as needed for moderate pain.  10/25/19   Cirigliano, Mary K, DO  XARELTO 20 MG TABS tablet TAKE 1 TABLET BY MOUTH EVERY DAY Patient taking differently: Take 20 mg by mouth daily with supper.  10/03/19   Cirigliano, MGarvin Fila DO    Allergies    Clonidine derivatives, Fish allergy, Shellfish allergy, Doxycycline, Indomethacin, Lyrica [pregabalin], Methyldopa, Morphine and related, Orange fruit [citrus], Strawberry (diagnostic), Cetirizine hcl, Codeine, Levaquin [levofloxacin in d5w], and Tomato  Review of Systems   Review of Systems  Respiratory: Negative for shortness of breath.   Cardiovascular: Positive for  near-syncope. Negative for chest pain.  Neurological: Negative for headaches.  All other systems reviewed and are negative.   Physical Exam Updated Vital Signs Ht _0  (1.626 m)   Wt 72.1 kg   BMI 27.29 kg/m   Physical Exam Vitals and nursing note reviewed.  Constitutional:      General: She is not in acute distress.    Appearance: She is well-developed. She is not diaphoretic.  HENT:     Head: Normocephalic and atraumatic.  Eyes:     Extraocular Movements: Extraocular movements intact.     Pupils: Pupils are equal, round, and reactive to light.  Cardiovascular:     Rate and Rhythm: Normal rate and regular rhythm.     Heart sounds: No murmur. No friction rub. No gallop.   Pulmonary:     Effort: Pulmonary effort is normal. No respiratory distress.     Breath sounds: Normal breath sounds. No wheezing.  Abdominal:     General: Bowel sounds are normal. There is no distension.     Palpations: Abdomen is soft.     Tenderness: There is no abdominal tenderness.  Musculoskeletal:        General: No swelling or tenderness. Normal range of motion.     Cervical back: Normal range of motion and neck supple.     Right lower leg: No edema.     Left lower leg: No edema.  Skin:    General: Skin is warm and dry.  Neurological:     General: No focal deficit present.     Mental Status: She is alert and oriented to person, place, and time.     Cranial Nerves: No cranial nerve deficit.     Sensory: No sensory deficit.     Motor: No weakness.     Coordination: Coordination normal.     ED Results / Procedures / Treatments   Labs (all labs ordered are listed, but only abnormal results are displayed) Labs Reviewed  BASIC METABOLIC PANEL  CBC WITH DIFFERENTIAL/PLATELET    EKG EKG Interpretation  Date/Time:  Tuesday December 12 2019 20:56:11 EST Ventricular Rate:  70 PR Interval:  144 QRS Duration: 98 QT Interval:  454 QTC Calculation: 490 R Axis:   -29 Text  Interpretation: Sinus rhythm with occasional Premature ventricular complexes Moderate voltage criteria for LVH, may be normal variant ( R in aVL , Cornell product ) Anterolateral infarct , age undetermined Abnormal ECG Confirmed by Veryl Speak [phone]) on 12/12/2019 9:09:48 PM   Radiology No results found.  Procedures Procedures (including critical care time)  Medications Ordered in ED Medications  sodium chloride 0.9 % bolus 500 mL (has no administration in time range)    ED Course  I have reviewed the triage vital signs and the nursing notes.  Pertinent labs & imaging results that were available during my care of the patient were reviewed by me and considered in my medical decision making (see chart for details).    MDM Rules/Calculators/A&P  Patient is an 86 year old female with history of CHF, coronary artery disease, hypertension, and diabetes.  Patient presents today for evaluation of a near syncopal episode as described in the HPI.  Patient now feels back to baseline.  Initial blood pressure was low and patient given IV fluids with good response.  Patient awaiting laboratory studies and reassessment.  Care will be signed out to Dr. Christy Gentles at shift change.  He will obtain the results of these studies and determine the final disposition.  Final Clinical Impression(s) / ED Diagnoses Final diagnoses:  None    Rx / DC Orders ED Discharge Orders    None       Veryl Speak, MD 12/13/19 1505

## 2019-12-12 NOTE — ED Notes (Signed)
Pt. was cleaned by this tech and RN. Purewick was placed on pt.

## 2019-12-12 NOTE — ED Notes (Signed)
Lab work sent down after IV team obtained labs. Called lab to see if they received it, and they stated they never received samples. Will attempt to redraw

## 2019-12-13 ENCOUNTER — Encounter (HOSPITAL_COMMUNITY): Payer: Self-pay | Admitting: Internal Medicine

## 2019-12-13 ENCOUNTER — Observation Stay (HOSPITAL_COMMUNITY): Payer: Medicare Other

## 2019-12-13 DIAGNOSIS — G9340 Encephalopathy, unspecified: Secondary | ICD-10-CM | POA: Diagnosis not present

## 2019-12-13 DIAGNOSIS — E1169 Type 2 diabetes mellitus with other specified complication: Secondary | ICD-10-CM

## 2019-12-13 DIAGNOSIS — E785 Hyperlipidemia, unspecified: Secondary | ICD-10-CM

## 2019-12-13 DIAGNOSIS — R42 Dizziness and giddiness: Secondary | ICD-10-CM | POA: Diagnosis not present

## 2019-12-13 DIAGNOSIS — Z7401 Bed confinement status: Secondary | ICD-10-CM | POA: Diagnosis not present

## 2019-12-13 DIAGNOSIS — R55 Syncope and collapse: Secondary | ICD-10-CM

## 2019-12-13 DIAGNOSIS — R0602 Shortness of breath: Secondary | ICD-10-CM | POA: Diagnosis not present

## 2019-12-13 DIAGNOSIS — M2550 Pain in unspecified joint: Secondary | ICD-10-CM | POA: Diagnosis not present

## 2019-12-13 DIAGNOSIS — R531 Weakness: Secondary | ICD-10-CM | POA: Diagnosis not present

## 2019-12-13 DIAGNOSIS — Z743 Need for continuous supervision: Secondary | ICD-10-CM | POA: Diagnosis not present

## 2019-12-13 LAB — BASIC METABOLIC PANEL
Anion gap: 12 (ref 5–15)
Anion gap: 11 (ref 5–15)
Anion gap: 8 (ref 5–15)
BUN: 38 mg/dL — ABNORMAL HIGH (ref 8–23)
BUN: 37 mg/dL — ABNORMAL HIGH (ref 8–23)
BUN: 35 mg/dL — ABNORMAL HIGH (ref 8–23)
CO2: 27 mmol/L (ref 22–32)
CO2: 26 mmol/L (ref 22–32)
CO2: 26 mmol/L (ref 22–32)
Calcium: 9.2 mg/dL (ref 8.9–10.3)
Calcium: 8.9 mg/dL (ref 8.9–10.3)
Calcium: 8.6 mg/dL — ABNORMAL LOW (ref 8.9–10.3)
Chloride: 99 mmol/L (ref 98–111)
Chloride: 103 mmol/L (ref 98–111)
Chloride: 104 mmol/L (ref 98–111)
Creatinine, Ser: 1.53 mg/dL — ABNORMAL HIGH (ref 0.44–1.00)
Creatinine, Ser: 1.26 mg/dL — ABNORMAL HIGH (ref 0.44–1.00)
Creatinine, Ser: 1.16 mg/dL — ABNORMAL HIGH (ref 0.44–1.00)
GFR calc Af Amer: 35 mL/min — ABNORMAL LOW (ref >60)
GFR calc Af Amer: 44 mL/min — ABNORMAL LOW (ref >60)
GFR calc Af Amer: 49 mL/min — ABNORMAL LOW (ref >60)
GFR calc non Af Amer: 30 mL/min — ABNORMAL LOW (ref >60)
GFR calc non Af Amer: 38 mL/min — ABNORMAL LOW (ref >60)
GFR calc non Af Amer: 42 mL/min — ABNORMAL LOW (ref >60)
Glucose, Bld: 124 mg/dL — ABNORMAL HIGH (ref 70–99)
Glucose, Bld: 96 mg/dL (ref 70–99)
Glucose, Bld: 99 mg/dL (ref 70–99)
Potassium: 4.3 mmol/L (ref 3.5–5.1)
Potassium: 3.9 mmol/L (ref 3.5–5.1)
Potassium: 4 mmol/L (ref 3.5–5.1)
Sodium: 138 mmol/L (ref 135–145)
Sodium: 140 mmol/L (ref 135–145)
Sodium: 138 mmol/L (ref 135–145)

## 2019-12-13 LAB — CBC WITH DIFFERENTIAL/PLATELET
Abs Immature Granulocytes: 0.06 10*3/uL (ref 0.00–0.07)
Basophils Absolute: 0 10*3/uL (ref 0.0–0.1)
Basophils Relative: 0 %
Eosinophils Absolute: 0.1 10*3/uL (ref 0.0–0.5)
Eosinophils Relative: 1 %
HCT: 32.2 % — ABNORMAL LOW (ref 36.0–46.0)
Hemoglobin: 10.5 g/dL — ABNORMAL LOW (ref 12.0–15.0)
Immature Granulocytes: 1 %
Lymphocytes Relative: 18 %
Lymphs Abs: 1.6 10*3/uL (ref 0.7–4.0)
MCH: 33.1 pg (ref 26.0–34.0)
MCHC: 32.6 g/dL (ref 30.0–36.0)
MCV: 101.6 fL — ABNORMAL HIGH (ref 80.0–100.0)
Monocytes Absolute: 0.7 10*3/uL (ref 0.1–1.0)
Monocytes Relative: 8 %
Neutro Abs: 6.7 10*3/uL (ref 1.7–7.7)
Neutrophils Relative %: 72 %
Platelets: 263 10*3/uL (ref 150–400)
RBC: 3.17 MIL/uL — ABNORMAL LOW (ref 3.87–5.11)
RDW: 16 % — ABNORMAL HIGH (ref 11.5–15.5)
WBC: 9.2 10*3/uL (ref 4.0–10.5)
nRBC: 0 % (ref 0.0–0.2)

## 2019-12-13 LAB — URINALYSIS, ROUTINE W REFLEX MICROSCOPIC
Bilirubin Urine: NEGATIVE
Glucose, UA: NEGATIVE mg/dL
Hgb urine dipstick: NEGATIVE
Ketones, ur: NEGATIVE mg/dL
Leukocytes,Ua: NEGATIVE
Nitrite: NEGATIVE
Protein, ur: NEGATIVE mg/dL
Specific Gravity, Urine: 1.011 (ref 1.005–1.030)
pH: 7 (ref 5.0–8.0)

## 2019-12-13 LAB — CBC
HCT: 33.7 % — ABNORMAL LOW (ref 36.0–46.0)
Hemoglobin: 10.6 g/dL — ABNORMAL LOW (ref 12.0–15.0)
MCH: 31.9 pg (ref 26.0–34.0)
MCHC: 31.5 g/dL (ref 30.0–36.0)
MCV: 101.5 fL — ABNORMAL HIGH (ref 80.0–100.0)
Platelets: 251 10*3/uL (ref 150–400)
RBC: 3.32 MIL/uL — ABNORMAL LOW (ref 3.87–5.11)
RDW: 16 % — ABNORMAL HIGH (ref 11.5–15.5)
WBC: 8.8 10*3/uL (ref 4.0–10.5)
nRBC: 0 % (ref 0.0–0.2)

## 2019-12-13 LAB — CBG MONITORING, ED
Glucose-Capillary: 90 mg/dL (ref 70–99)
Glucose-Capillary: 98 mg/dL (ref 70–99)
Glucose-Capillary: 120 mg/dL — ABNORMAL HIGH (ref 70–99)

## 2019-12-13 LAB — TROPONIN I (HIGH SENSITIVITY): Troponin I (High Sensitivity): 9 ng/L (ref <18)

## 2019-12-13 LAB — BRAIN NATRIURETIC PEPTIDE: B Natriuretic Peptide: 196.6 pg/mL — ABNORMAL HIGH (ref 0.0–100.0)

## 2019-12-13 LAB — SARS CORONAVIRUS 2 (TAT 6-24 HRS): SARS Coronavirus 2: NEGATIVE

## 2019-12-13 MED ORDER — DOCUSATE SODIUM 100 MG PO CAPS: 100.0000 mg | ORAL_CAPSULE | Freq: Every day | ORAL | Status: DC | PRN

## 2019-12-13 MED ORDER — INSULIN ASPART 100 UNIT/ML ~~LOC~~ SOLN: 0.0000 [IU] | Freq: Three times a day (TID) | SUBCUTANEOUS | Status: DC

## 2019-12-13 MED ORDER — PREDNISONE 5 MG PO TABS
7.5000 mg | ORAL_TABLET | Freq: Every day | ORAL | Status: DC
  Administered 2019-12-13: 09:00:00 7.5 mg via ORAL
  Filled 2019-12-13 (×2): qty 1

## 2019-12-13 MED ORDER — OMEGA-3-ACID ETHYL ESTERS 1 G PO CAPS
1000.0000 mg | ORAL_CAPSULE | Freq: Every day | ORAL | Status: DC
  Administered 2019-12-13: 1000 mg via ORAL
  Filled 2019-12-13 (×2): qty 1

## 2019-12-13 MED ORDER — ALLOPURINOL 100 MG PO TABS
100.0000 mg | ORAL_TABLET | Freq: Every day | ORAL | Status: DC
  Administered 2019-12-13: 22:00:00 100 mg via ORAL
  Filled 2019-12-13: qty 1

## 2019-12-13 MED ORDER — ACETAMINOPHEN 650 MG RE SUPP: 650.0000 mg | Freq: Four times a day (QID) | RECTAL | Status: DC | PRN

## 2019-12-13 MED ORDER — CARVEDILOL 12.5 MG PO TABS
25.0000 mg | ORAL_TABLET | Freq: Two times a day (BID) | ORAL | Status: DC
  Administered 2019-12-13 (×2): 25 mg via ORAL
  Filled 2019-12-13 (×2): qty 2

## 2019-12-13 MED ORDER — GABAPENTIN 100 MG PO CAPS
100.0000 mg | ORAL_CAPSULE | Freq: Three times a day (TID) | ORAL | Status: DC
  Administered 2019-12-13 (×3): 100 mg via ORAL
  Filled 2019-12-13 (×3): qty 1

## 2019-12-13 MED ORDER — ONDANSETRON HCL 4 MG/2ML IJ SOLN: 4.0000 mg | Freq: Four times a day (QID) | INTRAMUSCULAR | Status: DC | PRN

## 2019-12-13 MED ORDER — ALBUTEROL SULFATE (2.5 MG/3ML) 0.083% IN NEBU: 2.5000 mg | INHALATION_SOLUTION | Freq: Four times a day (QID) | RESPIRATORY_TRACT | Status: DC | PRN

## 2019-12-13 MED ORDER — TRAMADOL HCL 50 MG PO TABS: 50.0000 mg | ORAL_TABLET | Freq: Four times a day (QID) | ORAL | Status: DC | PRN

## 2019-12-13 MED ORDER — ONDANSETRON HCL 4 MG PO TABS: 4.0000 mg | ORAL_TABLET | Freq: Four times a day (QID) | ORAL | Status: DC | PRN

## 2019-12-13 MED ORDER — FUROSEMIDE 40 MG PO TABS: 40.0000 mg | ORAL_TABLET | Freq: Two times a day (BID) | ORAL | Status: DC

## 2019-12-13 MED ORDER — HYDROXYCHLOROQUINE SULFATE 200 MG PO TABS
200.0000 mg | ORAL_TABLET | Freq: Two times a day (BID) | ORAL | Status: DC
  Administered 2019-12-13 (×2): 200 mg via ORAL
  Filled 2019-12-13 (×2): qty 1

## 2019-12-13 MED ORDER — SODIUM CHLORIDE 0.9 % IV SOLN: INTRAVENOUS | Status: AC

## 2019-12-13 MED ORDER — RIVAROXABAN 20 MG PO TABS
20.0000 mg | ORAL_TABLET | Freq: Every day | ORAL | Status: DC
  Administered 2019-12-13: 20 mg via ORAL
  Filled 2019-12-13: qty 1

## 2019-12-13 MED ORDER — HYDRALAZINE HCL 20 MG/ML IJ SOLN
10.0000 mg | INTRAMUSCULAR | Status: DC | PRN
  Administered 2019-12-13: 19:00:00 10 mg via INTRAVENOUS
  Filled 2019-12-13: qty 1

## 2019-12-13 MED ORDER — LORAZEPAM 0.5 MG PO TABS: 0.2500 mg | ORAL_TABLET | Freq: Two times a day (BID) | ORAL | Status: DC | PRN

## 2019-12-13 MED ORDER — ACETAMINOPHEN 325 MG PO TABS
650.0000 mg | ORAL_TABLET | Freq: Four times a day (QID) | ORAL | Status: DC | PRN
  Administered 2019-12-13: 06:00:00 650 mg via ORAL
  Filled 2019-12-13: qty 2

## 2019-12-13 NOTE — H&P (Signed)
History and Physical    [name] [name] EKC:[id number] DOB: 1929-11-27 DOA: 12/12/2019  PCP: Ronnald Nian, DO  Patient coming from: Home.  History obtained from patient's daughter and patient and ER physician.  Chief Complaint: Felt dizzy.  HPI: [name] [name] is a 86 y.o. female with history of pulmonary embolism on Xarelto, chronic kidney disease stage III, previous history of nonischemic cardiomyopathy, anemia, diabetes mellitus type 2, hypertension who was diagnosed with COVID-19 infection last month and was in quarantine at the nursing home for 2 weeks and was discharged back to her home was having dinner yesterday at her home following which was having conversation with her daughter at home when suddenly patient felt dizzy and patient's daughter found that patient was cold and clammy and had some difficulty talking.  EMS was called.  Patient blood pressure was found to be in the 70 systolic by 40 diastolic.  Blood sugar was 200.  Patient was brought to the ER.  Patient symptoms lasted for 15 minutes.  ED Course: The ER patient blood pressure was 200 low and was given a liter bolus following which blood pressure improved.  Patient was afebrile not hypoxic.  Labs show worsening renal function with creatinine worsening from 1.05-1.5.  BNP was 190.6 patient was afebrile.  EKG was showing normal sinus rhythm.  Nonspecific T changes.  High sensitive troponin was 9.  Patient admitted for further observation.  My exam patient appears nonfocal able to talk well.  Review of Systems: As per HPI, rest all negative.   Past Medical History:  Diagnosis Date  . Allergic rhinitis due to pollen 04/27/2007  . Arthritis    "in q joint" (08/07/2013)  . CHF (congestive heart failure) ([place])   . Coronary atherosclerosis of native coronary artery   . Cough   . Edema 05/03/2013  . Exertional shortness of breath    "sometimes" (08/07/2013)  . First degree atrioventricular block   . Gout, unspecified  04/19/2013  . Hyperlipidemia 04/27/2007  . Hypertension   . Midline low back pain without sciatica 06/27/2014  . Muscle spasm   . Muscle weakness (generalized)   . Onychia and paronychia of toe   . Osteoarthrosis, unspecified whether generalized or localized, unspecified site 04/27/2007  . Other fall   . Other malaise and fatigue   . Pneumonia 05/2018  . Prepatellar bursitis   . Pulmonary embolism ([place]) 07/2018  . Rheumatic nodule ([place]) 06/28/2017  . Seizures ([place])   . Stroke ([place]) 01/17/2014  . Type II diabetes mellitus ([place])    "fasting 90-110s" (08/07/2013)  . Unspecified vitamin D deficiency     Past Surgical History:  Procedure Laterality Date  . ABDOMINAL HYSTERECTOMY  04/1980  . APPENDECTOMY  1960  . CARDIAC CATHETERIZATION  2003  . CATARACT EXTRACTION W/ INTRAOCULAR LENS  IMPLANT, BILATERAL Bilateral ~ 2012  . CHOLECYSTECTOMY N/A 06/26/2018   Procedure: LAPAROSCOPIC CHOLECYSTECTOMY POSSIBLE INTRAOPERATIVE CHOLANGIOGRAM;  Surgeon: Coralie Keens, MD;  Location: [facility];  Service: General;  Laterality: N/A;  . JOINT REPLACEMENT    . REPLACEMENT TOTAL KNEE Right 09/2005  . RIGHT/LEFT HEART CATH AND CORONARY ANGIOGRAPHY N/A 06/01/2018   Procedure: RIGHT/LEFT HEART CATH AND CORONARY ANGIOGRAPHY;  Surgeon: Jettie Booze, MD;  Location: [facility];  Service: Cardiovascular;  Laterality: N/A;  . SHOULDER OPEN ROTATOR CUFF REPAIR Right 07/1999  . TONSILLECTOMY  08/1974  . TOTAL KNEE ARTHROPLASTY Left 08/07/2013   Procedure: TOTAL KNEE ARTHROPLASTY- LEFT;  Surgeon: Vickey Huger,  MD;  Location: [facility];  Service: Orthopedics;  Laterality: Left;  . TRANSTHORACIC ECHOCARDIOGRAM  2003   EF 55-65%; mild concentric LVH     reports that she has never smoked. She has never used smokeless tobacco. She reports that she does not drink alcohol or use drugs.  Allergies  Allergen Reactions  . Clonidine Derivatives Swelling    [name] reports patient's tongue was swollen  and patient hallucinated  . Fish Allergy Diarrhea, Swelling and Other (See Comments)    Turns skin "black," but can tolerate white fish Salmon- Diarrhea  . Shellfish Allergy Hives  . Doxycycline Rash  . Indomethacin Other (See Comments)    Reaction not recalled by the patient  . Lyrica [Pregabalin] Other (See Comments)    Hallucinations   . Methyldopa Other (See Comments)    Aldomet (for hypertension): Reaction not recalled by the patient  . Morphine And Related Other (See Comments)    Family reports it drops her bp that she needs iv fluids  . Orange Fruit [Citrus] Other (See Comments)    Indigestion/heartburn  . Strawberry (Diagnostic) Itching  . Cetirizine Hcl Itching and Rash  . Codeine Itching  . Levaquin [Levofloxacin In D5w] Rash  . Tomato Rash    Family History  Problem Relation Age of Onset  . Heart attack Father     Prior to Admission medications   Medication Sig Start Date End Date Taking? Authorizing Provider  acetaminophen (TYLENOL) 500 MG tablet Take 1,000 mg by mouth every 8 (eight) hours as needed for mild pain.     [provider]  albuterol (PROVENTIL HFA;VENTOLIN HFA) 108 (90 Base) MCG/ACT inhaler Inhale 1-2 puffs into the lungs every 6 (six) hours as needed for wheezing or shortness of breath. 12/20/18   Desiree Hane, MD  allopurinol (ZYLOPRIM) 100 MG tablet TAKE 1 TABLET BY MOUTH EVERY DAY AT BEDTIME Patient taking differently: Take 100 mg by mouth at bedtime.  09/21/19   Cirigliano, Garvin Fila, DO  benzonatate (TESSALON) 100 MG capsule Take 1 capsule (100 mg total) by mouth 2 (two) times daily as needed for cough. 08/29/19   Luetta Nutting, DO  Blood Glucose Monitoring Suppl (ACCU-CHEK AVIVA PLUS) w/Device KIT Use to check blood sugars 1-2 times daily. 09/06/19   Cirigliano, Mary K, DO  carvedilol (COREG) 25 MG tablet TAKE 1 TABLET BY MOUTH TWICE DAILY Patient taking differently: Take 25 mg by mouth 2 (two) times daily with a meal.  09/21/19    Cirigliano, Mary K, DO  cephALEXin (KEFLEX) 500 MG capsule Take 1 capsule (500 mg total) by mouth 2 (two) times daily. Patient not taking: Reported on 10/30/2019 09/25/19   Ward, Cyril Mourning N, DO  D3-1000 25 MCG (1000 UT) capsule Take 1 capsule (1,000 Units total) by mouth daily. 10/18/19   Cirigliano, Garvin Fila, DO  Dextromethorphan-guaiFENesin (MUCINEX DM MAXIMUM STRENGTH) 60-1200 MG TB12 Take 1 tablet by mouth 2 (two) times daily as needed (cough and congestion).    [provider]  docusate sodium (COLACE) 100 MG capsule Take 1 capsule (100 mg total) by mouth daily as needed for mild constipation. 12/20/18   Desiree Hane, MD  ENSURE (ENSURE) Take 237 mLs by mouth every evening. Patient not taking: Reported on 10/25/2019 12/20/18   Desiree Hane, MD  ENTRESTO 49-51 MG TAKE 1 TABLET BY MOUTH EVERY DAY Patient taking differently: Take 1 tablet by mouth daily.  09/21/19   Cirigliano, Garvin Fila, DO  furosemide (LASIX) 40 MG  tablet TAKE 1 TABLET BY MOUTH TWICE DAILY Patient taking differently: Take 40 mg by mouth 2 (two) times daily.  09/21/19   Cirigliano, Garvin Fila, DO  gabapentin (NEURONTIN) 100 MG capsule TAKE 1 CAPSULE BY MOUTH THREE TIMES DAILY Patient taking differently: Take 100 mg by mouth 3 (three) times daily.  09/21/19   Cirigliano, Mary K, DO  glucose blood (ACCU-CHEK AVIVA PLUS) test strip Use to test blood sugars 1-2 times daily. 09/06/19   Cirigliano, Mary K, DO  guaiFENesin-codeine (ROBITUSSIN AC) 100-10 MG/5ML syrup Take 5 mLs by mouth 2 (two) times daily as needed for cough. Patient not taking: Reported on 10/30/2019 09/13/19   Virgel Manifold, MD  hydrALAZINE (APRESOLINE) 25 MG tablet Take 1 tablet (25 mg total) by mouth 3 (three) times daily. 09/28/19   Skeet Latch, MD  hydroxychloroquine (PLAQUENIL) 200 MG tablet Take 1 tablet (200 mg total) by mouth 2 (two) times daily. 12/20/18   Desiree Hane, MD  hydroxypropyl methylcellulose / hypromellose (ISOPTO TEARS / GONIOVISC)  2.5 % ophthalmic solution Place 1 drop into both eyes daily. 12/20/18   Desiree Hane, MD  isosorbide mononitrate (IMDUR) 30 MG 24 hr tablet TAKE 1 TABLET BY MOUTH EVERY DAY Patient taking differently: Take 30 mg by mouth daily.  09/21/19   Cirigliano, Mary K, DO  JANUVIA 25 MG tablet TAKE 1/2 TABLET BY MOUTH(12,5 MG) EVERY DAY Patient taking differently: Take 12.5 mg by mouth daily.  09/21/19   Cirigliano, Garvin Fila, DO  Lancets (ACCU-CHEK SOFT TOUCH) lancets Use to check blood sugars 1-2 times daily. 09/06/19   Cirigliano, Garvin Fila, DO  leflunomide (ARAVA) 20 MG tablet Take 1 tablet (20 mg total) by mouth daily. Patient not taking: Reported on 10/30/2019 12/20/18   Desiree Hane, MD  LORazepam (ATIVAN) 0.5 MG tablet Take 0.25 mg by mouth as needed for anxiety.     [provider]  Olopatadine HCl 0.2 % SOLN Place 1 drop into both eyes daily. 11/15/19   Cirigliano, Garvin Fila, DO  Omega 3-6-9 Fatty Acids (OMEGA 3-6-9 COMPLEX) CAPS Take 1 capsule by mouth daily. Omega 3 Black    [provider]  potassium chloride SA (K-DUR) 20 MEQ tablet Take 20 mEq by mouth See admin instructions. Monday and Thursday    [provider]  predniSONE (DELTASONE) 5 MG tablet Take 7.5 mg by mouth daily with breakfast. Taking 1 & 1/2 tabs = 7.49m    [provider]  Probiotic CAPS Take 1 capsule by mouth daily. Patient not taking: Reported on 10/30/2019 09/25/19   Ward, KDelice Bison DO  saccharomyces boulardii (FLORASTOR) 250 MG capsule Take 250 mg by mouth every other day.    [provider]  traMADol (ULTRAM) 50 MG tablet TAKE 1 TO 2 TABLETS BY MOUTH EVERY 6 HOURS AS NEEDED FOR PAIN Patient taking differently: Take 50-100 mg by mouth every 6 (six) hours as needed for moderate pain.  10/25/19   Cirigliano, Mary K, DO  XARELTO 20 MG TABS tablet TAKE 1 TABLET BY MOUTH EVERY DAY Patient taking differently: Take 20 mg by mouth daily with supper.  10/03/19   CRonnald Nian DO     Physical Exam: Constitutional: Moderately built and nourished. Vitals:   12/13/19 0200 12/13/19 0215 12/13/19 0245 12/13/19 0300  BP: (!) 180/76 (!) 185/79 (!) 168/80 (!) 177/80  Pulse: 74     Resp: 14 (!) 22 (!) 23 (!) 26  Temp:      TempSrc:  SpO2: 97%     Weight:      Height:       Eyes: Anicteric no pallor. ENMT: No discharge from the ears eyes nose or mouth. Neck: No mass felt.  No neck rigidity no JVD appreciated. Respiratory: No rhonchi or crepitations. Cardiovascular: S1-S2 heard. Abdomen: Soft nontender bowel sounds present. Musculoskeletal: No edema.  No joint effusion. Skin: No rash. Neurologic: Alert awake oriented time place and person.  Moves all extremities. Psychiatric: Appears normal per normal affect.   Labs on Admission: I have personally reviewed following labs and imaging studies  CBC: Recent Labs  Lab 12/12/19 2322  WBC 9.2  NEUTROABS 6.7  HGB 10.5*  HCT 32.2*  MCV 101.6*  PLT 384   Basic Metabolic Panel: Recent Labs  Lab 12/12/19 2322  NA 138  K 4.3  CL 99  CO2 27  GLUCOSE 124*  BUN 38*  CREATININE 1.53*  CALCIUM 9.2   GFR: Estimated Creatinine Clearance: 25.2 mL/min (A) (by C-G formula based on SCr of 1.53 mg/dL (H)). Liver Function Tests: No results for input(s): AST, ALT, ALKPHOS, BILITOT, PROT, ALBUMIN in the last 168 hours. No results for input(s): LIPASE, AMYLASE in the last 168 hours. No results for input(s): AMMONIA in the last 168 hours. Coagulation Profile: No results for input(s): INR, PROTIME in the last 168 hours. Cardiac Enzymes: No results for input(s): CKTOTAL, CKMB, CKMBINDEX, TROPONINI in the last 168 hours. BNP (last 3 results) Recent Labs    05/29/19 1252  PROBNP 14,600*   HbA1C: No results for input(s): HGBA1C in the last 72 hours. CBG: No results for input(s): GLUCAP in the last 168 hours. Lipid Profile: No results for input(s): CHOL, HDL, LDLCALC, TRIG, CHOLHDL, LDLDIRECT in the last 72  hours. Thyroid Function Tests: No results for input(s): TSH, T4TOTAL, FREET4, T3FREE, THYROIDAB in the last 72 hours. Anemia Panel: No results for input(s): VITAMINB12, FOLATE, FERRITIN, TIBC, IRON, RETICCTPCT in the last 72 hours. Urine analysis:    Component Value Date/Time   COLORURINE YELLOW 09/24/2019 2351   APPEARANCEUR CLEAR 09/24/2019 2351   LABSPEC 1.006 09/24/2019 2351   PHURINE 6.0 09/24/2019 2351   GLUCOSEU NEGATIVE 09/24/2019 2351   HGBUR NEGATIVE 09/24/2019 2351   BILIRUBINUR NEGATIVE 09/24/2019 2351   BILIRUBINUR Negative 01/13/2017 [place] 09/24/2019 2351   PROTEINUR NEGATIVE 09/24/2019 2351   UROBILINOGEN 0.2 01/13/2017 1234   UROBILINOGEN 1.0 09/11/2015 0135   NITRITE NEGATIVE 09/24/2019 2351   LEUKOCYTESUR MODERATE (A) 09/24/2019 2351   Sepsis Labs: _0 (procalcitonin:4,lacticidven:4) )No results found for this or any previous visit (from the past 240 hour(s)).   Radiological Exams on Admission: No results found.  EKG: Independently reviewed.  Normal sinus rhythm.  Assessment/Plan Principal Problem:   Near syncope Active Problems:   Type 2 diabetes mellitus with hyperlipidemia ([place]), patient declines statin   CAD (coronary artery disease)   Anemia of chronic disease   CKD stage 3 due to type 2 diabetes mellitus ([facility])   Chronic combined systolic and diastolic CHF (congestive heart failure) ([facility])   Idiopathic chronic gout of multiple sites with tophus   Rheumatoid arthritis involving both knees ([place])   History of CVA (cerebrovascular accident)    1. Near syncopal episode -patient was hypotensive at site when incident happened as recorded by the EMS.  Cause for the sudden onset of hypotension not clear.  Per the patient's daughter patient not had any new medications are any nausea vomiting diarrhea chest pain or shortness of  breath prior to the incident.  Denies some difficulty talking during the episode.  At this time holding off  patient's antihypertensives except for Coreg.  Will keep patient as needed IV hydralazine.  Check MRI brain since patient had some difficulty talking.  Chest x-ray UA pending.  2. Acute on chronic kidney disease stage III -holding Entresto and Lasix due to worsening renal function.  Patient also was hypotensive at presentation received fluids.  Repeat metabolic panel. 3. Diabetes mellitus type 2 we will keep patient on sliding scale coverage. 4. History of rheumatoid arthritis on prednisone and Plaquenil. 5. History of pulmonary embolism on Xarelto confirmed with patient's daughter that patient is still taking Xarelto. 6. Recent Covid infection  -was on isolation for almost 2 weeks at the nursing home.  Presently lives at home.  Asymptomatic at this time.  Not hypoxic or febrile. 7. History of nonischemic cardiomyopathy last EF in July 2020 was 30 to 35%.  Appears compensated at this time.  Patient received fluid in the ER.  Holding Entresto and Lasix due to initial low normal blood pressure. 8. History of hypertension presently holding antihypertensive except for Coreg. 9. Nonobstructive CAD.  Denies any chest pain. 10. Previous history of CVA presently on Xarelto. 11. Anemia likely from renal disease appears to be at baseline.  Follow CBC.   DVT prophylaxis: Xarelto. Code Status: Full code. Family Communication: [name]. Disposition Plan: Home when stable. Consults called: Physical therapy. Admission status: Observation.   Rise Patience MD [facility] Pager [phone].  If 7PM-7AM, please contact night-coverage [email or web address] Password [email or web address]  12/13/2019, 3:31 AM

## 2019-12-13 NOTE — ED Notes (Signed)
Called pt dtr to go over discharge and let her know her mom was on the way home with PTAR.

## 2019-12-13 NOTE — Evaluation (Signed)
Physical Therapy Evaluation and discharge Patient Details Name: [name] [name] MRN: [id number] DOB: [birth year] Today's Date: 12/13/2019   History of Present Illness  Pt is an 86 y/o female admitted secondary to near syncope. Workup pending. MRI negative for acute abnormality. PMH includes covid, DM, CKD, CVA with R weakness, PE, RA, CAD, and CHF.   Clinical Impression  Patient evaluated by Physical Therapy with no further acute PT needs identified. All education has been completed and the patient has no further questions. Pt at baseline level of function. Required min to min guard A for rolling. Reports she plans to return home with assist from aide and family. Recommend resuming [facility] services. See below for any follow-up Physical Therapy or equipment needs. PT is signing off. Thank you for this referral. If needs change, please re-consult.     Follow Up Recommendations Supervision/Assistance - 24 hour;Home health PT([facility] and [facility])    Equipment Recommendations  None recommended by PT    Recommendations for Other Services       Precautions / Restrictions Precautions Precautions: Fall Restrictions Weight Bearing Restrictions: No      Mobility  Bed Mobility Overal bed mobility: Needs Assistance Bed Mobility: Rolling Rolling: Min assist;Min guard         General bed mobility comments: Min guard to min A for assist with rolling. Pt able to use bed rails to assist with rolling. Pt reports she performs at home when aide is placing lift pad or when bathing.   Transfers                 General transfer comment: NT, uses lift at baseline.   Ambulation/Gait                Stairs            Wheelchair Mobility    Modified Rankin (Stroke Patients Only)       Balance                                             Pertinent Vitals/Pain Pain Assessment: No/denies pain    Home Living Family/patient expects to be discharged to:: Private  residence Living Arrangements: Children Available Help at Discharge: Family;Personal care attendant;Available 24 hours/day Type of Home: House Home Access: Level entry     Home Layout: One level Home Equipment: Bedside commode;Wheelchair - manual;Other (comment)(hoyer lift)      Prior Function Level of Independence: Needs assistance   Gait / Transfers Assistance Needed: Uses hoyer lift to transfer to [place]. Has been working with PT on standing.   ADL's / Homemaking Assistance Needed: Requires assist for ADLs.         Hand Dominance        Extremity/Trunk Assessment   Upper Extremity Assessment Upper Extremity Assessment: RUE deficits/detail RUE Deficits / Details: RUE weakness at baseline     Lower Extremity Assessment Lower Extremity Assessment: RLE deficits/detail RLE Deficits / Details: RLE weakness at baseline. ankle DF 0/5 and hip flexors at 2/5.        Communication   Communication: No difficulties  Cognition Arousal/Alertness: Awake/alert Behavior During Therapy: WFL for tasks assessed/performed Overall Cognitive Status: Within Functional Limits for tasks assessed  General Comments      Exercises     Assessment/Plan    PT Assessment All further PT needs can be met in the next venue of care  PT Problem List Decreased strength;Decreased balance;Decreased mobility       PT Treatment Interventions      PT Goals (Current goals can be found in the Care Plan section)  Acute Rehab PT Goals Patient Stated Goal: to return home PT Goal Formulation: With patient Time For Goal Achievement: 12/13/19 Potential to Achieve Goals: Fair    Frequency     Barriers to discharge        Co-evaluation               AM-PAC PT "6 Clicks" Mobility  Outcome Measure Help needed turning from your back to your side while in a flat bed without using bedrails?: A Little Help needed moving from lying on your  back to sitting on the side of a flat bed without using bedrails?: Total Help needed moving to and from a bed to a chair (including a wheelchair)?: Total Help needed standing up from a chair using your arms (e.g., wheelchair or bedside chair)?: Total Help needed to walk in hospital room?: Total Help needed climbing 3-5 steps with a railing? : Total 6 Click Score: 8    End of Session   Activity Tolerance: Patient tolerated treatment well Patient left: in bed;with call bell/phone within reach Nurse Communication: Mobility status PT Visit Diagnosis: Muscle weakness (generalized) (M62.81)    Time: 2589-4834 PT Time Calculation (min) (ACUTE ONLY): 13 min   Charges:   PT Evaluation $PT Eval Low Complexity: 1 Low          Lou Miner, DPT  Acute Rehabilitation Services  Pager: [phone] Office: [phone]   Rudean Hitt 12/13/2019, 1:36 PM

## 2019-12-13 NOTE — ED Notes (Addendum)
Pt transported to MRI 

## 2019-12-13 NOTE — ED Notes (Signed)
Patient dressed and ready awaiting PTAR. IV remains in place at this time.  Dinner provided.

## 2019-12-13 NOTE — ED Notes (Signed)
Patient verbalizes understanding of discharge instructions. Opportunity for questioning and answers were provided. Armband removed by staff, pt discharged from ED.  

## 2019-12-13 NOTE — ED Notes (Signed)
CBG 98  

## 2019-12-13 NOTE — ED Provider Notes (Signed)
I assumed care in signout to follow-up on labs.  Labs reveal mild AKI.  Patient feels near baseline, but did have significant episode at home with near syncope.  Discussed with her daughter Para March.  Patient be admitted for cardiac monitoring  Discussed with Dr. Toniann Fail for admission   Zadie Rhine, MD 12/13/19 [phone]

## 2019-12-13 NOTE — Discharge Instructions (Signed)
*   Follow up with PCP in 1-2 weeks* Please obtain BMP/CBC in one week Resume Lasix tomorrow, not today   Near-Syncope Near-syncope is when you suddenly get weak or dizzy, or you feel like you might pass out (faint). This may also be called presyncope. This is due to a lack of blood flow to the brain. During an episode of near-syncope, you may:  Feel dizzy, weak, or light-headed.  Feel sick to your stomach (nauseous).  See all white or all black.  See spots.  Have cold, clammy skin. This condition is caused by a sudden decrease in blood flow to the brain. This decrease can result from various causes, but most of those causes are not dangerous. However, near-syncope may be a sign of a serious medical problem, so it is important to seek medical care. Follow these instructions at home: Medicines  Take over-the-counter and prescription medicines only as told by your doctor.  If you are taking blood pressure or heart medicine, get up slowly and spend many minutes getting ready to sit and then stand. This can help with dizziness. General instructions  Be aware of any changes in your symptoms.  Talk with your doctor about your symptoms. You may need to have testing to find the cause of your near-syncope.  If you start to feel like you might pass out, lie down right away. Raise (elevate) your feet above the level of your heart. Breathe deeply and steadily. Wait until all of the symptoms are gone.  Have someone stay with you until you feel stable.  Do not drive, use machinery, or play sports until your doctor says it is okay.  Drink enough fluid to keep your pee (urine) pale yellow.  Keep all follow-up visits as told by your doctor. This is important. Get help right away if you:  Have a seizure.  Have pain in your: ? Chest. ? Belly (abdomen). ? Back.  Faint once or more than once.  Have a very bad headache.  Are bleeding from your mouth or butt.  Have black or tarry poop  (stool).  Have a very fast or uneven heartbeat (palpitations).  Are mixed up (confused).  Have trouble walking.  Are very weak.  Have trouble seeing. These symptoms may be an emergency. Do not wait to see if the symptoms will go away. Get medical help right away. Call your local emergency services (911 in the [place] Do not drive yourself to the hospital. Summary  Near-syncope is when you suddenly get weak or dizzy, or you feel like you might pass out (faint).  This condition is caused by a lack of blood flow to the brain.  Near-syncope may be a sign of a serious medical problem, so it is important to seek medical care. This information is not intended to replace advice given to you by your health care provider. Make sure you discuss any questions you have with your health care provider. Document Revised: 02/17/2019 Document Reviewed: 09/14/2018 Elsevier Patient Education  2020 [employer].

## 2019-12-13 NOTE — ED Notes (Signed)
Pt returned from MRI °

## 2019-12-13 NOTE — Discharge Summary (Signed)
Physician Discharge Summary  [name] [name] KLK:[id number] DOB: 1930/07/12 DOA: 12/12/2019  PCP: Ronnald Nian, DO  Admit date: 12/12/2019 Discharge date: 12/13/2019  Admitted From: Home Disposition: Home  Recommendations for Outpatient Follow-up:  1. Follow up with PCP in 1-2 weeks 2. Please obtain BMP/CBC in one week 3. Please follow up on the following pending results:  Home Health: Yes Equipment/Devices: None  Discharge Condition: Stable CODE STATUS: Full code Diet recommendation: Cardiac  Subjective: Seen and examined.  Feels much better.  No more dizziness.  HPI: [name] [name] is a 86 y.o. female with history of pulmonary embolism on Xarelto, chronic kidney disease stage III, previous history of nonischemic cardiomyopathy, anemia, diabetes mellitus type 2, hypertension who was diagnosed with COVID-19 infection last month and was in quarantine at the nursing home for 2 weeks and was discharged back to her home was having dinner yesterday at her home following which was having conversation with her daughter at home when suddenly patient felt dizzy and patient's daughter found that patient was cold and clammy and had some difficulty talking.  EMS was called.  Patient blood pressure was found to be in the 70 systolic by 40 diastolic.  Blood sugar was 200.  Patient was brought to the ER.  Patient symptoms lasted for 15 minutes.  ED Course: The ER patient blood pressure was 200 low and was given a liter bolus following which blood pressure improved.  Patient was afebrile not hypoxic.  Labs show worsening renal function with creatinine worsening from 1.05-1.5.  BNP was 190.6 patient was afebrile.  EKG was showing normal sinus rhythm.  Nonspecific T changes.  High sensitive troponin was 9.  Patient admitted for further observation.  My exam patient appears nonfocal able to talk well.  Brief/Interim Summary: Patient was admitted as observation under hospital service due to possible  dehydration, near syncope and acute kidney injury.  She was started on IV fluids.  Renal function has improved significantly and is very close to her baseline which is normal.  Patient has no more symptoms of dizziness.  Her blood pressure initially was low when she came in but then her blood pressure has been running high or normal.  Patient was seen by PT OT and they recommended home health PT OT and aide which has been ordered for her.  Patient have improved quite frequently and is back to her baseline so she will be discharged home.  I also discussed with her [name] who is in agreement with the plan.  Due to her dehydration which has significantly improved, I have advised her to resume her Lasix tomorrow instead of today.  Discharge Diagnoses:  Principal Problem:   Near syncope Active Problems:   Type 2 diabetes mellitus with hyperlipidemia ([place]), patient declines statin   CAD (coronary artery disease)   Anemia of chronic disease   CKD stage 3 due to type 2 diabetes mellitus (HCC)   Chronic combined systolic and diastolic CHF (congestive heart failure) (HCC)   Idiopathic chronic gout of multiple sites with tophus   Rheumatoid arthritis involving both knees (HCC)   History of CVA (cerebrovascular accident)    Discharge Instructions  Discharge Instructions    Discharge patient   Complete by: As directed    Discharge disposition: 06-Home-Health Care Svc   Discharge patient date: 12/13/2019     Allergies as of 12/13/2019      Reactions   Clonidine Derivatives Swelling   [name] reports patient's tongue  was swollen and patient hallucinated   Fish Allergy Diarrhea, Swelling, Other (See Comments)   Turns skin "black," but can tolerate white fish Salmon- Diarrhea   Shellfish Allergy Hives   Doxycycline Rash   Indomethacin Other (See Comments)   Reaction not recalled by the patient   Lyrica [pregabalin] Other (See Comments)   Hallucinations   Methyldopa  Other (See Comments)   Aldomet (for hypertension): Reaction not recalled by the patient   Morphine And Related Other (See Comments)   Family reports it drops her bp that she needs iv fluids   Orange Fruit [citrus] Other (See Comments)   Indigestion/heartburn   Strawberry (diagnostic) Itching   Cetirizine Hcl Itching, Rash   Codeine Itching   Levaquin [levofloxacin In D5w] Rash   Tomato Rash      Medication List    TAKE these medications   Accu-Chek Aviva Plus test strip Generic drug: glucose blood Use to test blood sugars 1-2 times daily.   Accu-Chek Aviva Plus w/Device Kit Use to check blood sugars 1-2 times daily.   accu-chek soft touch lancets Use to check blood sugars 1-2 times daily.   acetaminophen 500 MG tablet Commonly known as: TYLENOL Take 1,000 mg by mouth every 8 (eight) hours as needed for mild pain.   albuterol 108 (90 Base) MCG/ACT inhaler Commonly known as: VENTOLIN HFA Inhale 1-2 puffs into the lungs every 6 (six) hours as needed for wheezing or shortness of breath.   allopurinol 100 MG tablet Commonly known as: ZYLOPRIM TAKE 1 TABLET BY MOUTH EVERY DAY AT BEDTIME   carvedilol 25 MG tablet Commonly known as: COREG TAKE 1 TABLET BY MOUTH TWICE DAILY What changed: when to take this   D3-1000 25 MCG (1000 UT) capsule Generic drug: Cholecalciferol Take 1 capsule (1,000 Units total) by mouth daily.   docusate sodium 100 MG capsule Commonly known as: COLACE Take 1 capsule (100 mg total) by mouth daily as needed for mild constipation.   Ensure Take 237 mLs by mouth every evening.   Entresto 49-51 MG Generic drug: sacubitril-valsartan TAKE 1 TABLET BY MOUTH EVERY DAY   furosemide 40 MG tablet Commonly known as: LASIX Take 1 tablet (40 mg total) by mouth 2 (two) times daily. Start taking on: December 14, 2019   gabapentin 100 MG capsule Commonly known as: NEURONTIN TAKE 1 CAPSULE BY MOUTH THREE TIMES DAILY   hydrALAZINE 25 MG tablet Commonly  known as: APRESOLINE Take 1 tablet (25 mg total) by mouth 3 (three) times daily.   hydroxychloroquine 200 MG tablet Commonly known as: PLAQUENIL Take 1 tablet (200 mg total) by mouth 2 (two) times daily.   isosorbide mononitrate 30 MG 24 hr tablet Commonly known as: IMDUR TAKE 1 TABLET BY MOUTH EVERY DAY   Januvia 25 MG tablet Generic drug: sitaGLIPtin TAKE 1/2 TABLET BY MOUTH(12,5 MG) EVERY DAY What changed: See the new instructions.   Olopatadine HCl 0.2 % Soln Place 1 drop into both eyes daily.   Omega 3-6-9 Complex Caps Take 1 capsule by mouth daily. Omega 3 Black   potassium chloride SA 20 MEQ tablet Commonly known as: KLOR-CON Take 20 mEq by mouth See admin instructions. Monday and Thursday   predniSONE 5 MG tablet Commonly known as: DELTASONE Take 7.5 mg by mouth daily with breakfast.   Probiotic Caps Take 1 capsule by mouth daily.   traMADol 50 MG tablet Commonly known as: ULTRAM TAKE 1 TO 2 TABLETS BY MOUTH EVERY 6 HOURS AS NEEDED FOR  PAIN What changed:   reasons to take this  additional instructions   Xarelto 20 MG Tabs tablet Generic drug: rivaroxaban TAKE 1 TABLET BY MOUTH EVERY DAY What changed:   how much to take  when to take this      Follow-up Information    Ronnald Nian, DO Follow up in 1 week(s).   Specialty: Family Medicine Contact information: [place] [address] [phone]        Skeet Latch, MD .   Specialty: Cardiology Contact information: [address] [place] [address] [phone]          Allergies  Allergen Reactions  . Clonidine Derivatives Swelling    [name]  . Fish Allergy Diarrhea, Swelling and Other (See Comments)    Turns skin "black," but can tolerate white fish Salmon- Diarrhea  . Shellfish Allergy Hives  . Doxycycline Rash  . Indomethacin Other (See Comments)     Reaction not recalled by the patient  . Lyrica [Pregabalin] Other (See Comments)    Hallucinations   . Methyldopa Other (See Comments)    Aldomet (for hypertension): Reaction not recalled by the patient  . Morphine And Related Other (See Comments)    Family reports it drops her bp that she needs iv fluids  . Orange Fruit [Citrus] Other (See Comments)    Indigestion/heartburn  . Strawberry (Diagnostic) Itching  . Cetirizine Hcl Itching and Rash  . Codeine Itching  . Levaquin [Levofloxacin In D5w] Rash  . Tomato Rash    Consultations: None   Procedures/Studies: MR BRAIN WO CONTRAST  Result Date: 12/13/2019 CLINICAL DATA:  Encephalopathy. Sudden onset dizziness associated with hypotension at home. EXAM: MRI HEAD WITHOUT CONTRAST TECHNIQUE: Multiplanar, multiecho pulse sequences of the brain and surrounding structures were obtained without intravenous contrast. COMPARISON:  01/10/2019 FINDINGS: Brain: No acute infarction, hemorrhage, hydrocephalus, extra-axial collection or mass lesion. Confluent ischemic gliosis in the deep cerebral white matter with discrete insult causing wallerian changes crossing the anterior corpus callosum. Cerebral volume loss with central predilection, nonspecific. Vascular: Preserved flow voids Skull and upper cervical spine: Normal marrow signal. Mild upper cervical facet degeneration. Sinuses/Orbits: Bilateral cataract resection. Mild opacification of right mastoid air cells and the left sphenoid sinus. Right nasopharyngeal retention cyst. Mild mucosal thickening in the left mastoid air cells. Other: Chronic simple cystic appearance in the superficial left parotid. IMPRESSION: Senescent changes without acute finding or new abnormality when compared to March 2020. Electronically Signed   By: Monte Fantasia [name]   On: 12/13/2019 07:09   DG Chest Port 1 View  Result Date: 12/13/2019 CLINICAL DATA:  Shortness of breath EXAM: PORTABLE CHEST 1 VIEW COMPARISON:  Chest  x-ray 10/29/2019 FINDINGS: Right left mislabeling based on previous imaging. Cardiomegaly and aortic tortuosity. There is no edema, consolidation, effusion, or pneumothorax. No acute osseous finding.Advanced glenohumeral osteoarthritis on both sides. IMPRESSION: 1. No acute finding when compared to prior. 2. Cardiomegaly. Electronically Signed   By: Monte Fantasia [name]   On: 12/13/2019 05:39      Discharge Exam: Vitals:   12/13/19 1229 12/13/19 1230  BP: (!) 149/75 (!) 155/78  Pulse: 72   Resp: 12 16  Temp:    SpO2: 99%    Vitals:   12/13/19 1130 12/13/19 1200 12/13/19 1229 12/13/19 1230  BP:  (!) 157/72 (!) 149/75 (!) 155/78  Pulse: 71  72   Resp: 16 14 12  16  Temp:      TempSrc:      SpO2: 99%  99%   Weight:      Height:        General: Pt is alert, awake, not in acute distress Cardiovascular: RRR, S1/S2 +, no rubs, no gallops Respiratory: CTA bilaterally, no wheezing, no rhonchi Abdominal: Soft, NT, ND, bowel sounds + Extremities: no edema, no cyanosis    The results of significant diagnostics from this hospitalization (including imaging, microbiology, ancillary and laboratory) are listed below for reference.     Microbiology: Recent Results (from the past 240 hour(s))  SARS CORONAVIRUS 2 (TAT 6-24 HRS) Nasopharyngeal Nasopharyngeal Swab     Status: None   Collection Time: 12/13/19  3:00 AM   Specimen: Nasopharyngeal Swab  Result Value Ref Range Status   SARS Coronavirus 2 NEGATIVE NEGATIVE Final    Comment: (NOTE) SARS-CoV-2 target nucleic acids are NOT DETECTED. The SARS-CoV-2 RNA is generally detectable in upper and lower respiratory specimens during the acute phase of infection. Negative results do not preclude SARS-CoV-2 infection, do not rule out co-infections with other pathogens, and should not be used as the sole basis for treatment or other patient management decisions. Negative results must be combined with clinical observations, patient history, and  epidemiological information. The expected result is Negative. Fact Sheet for Patients: SugarRoll.be Fact Sheet for Healthcare Providers: https://www.woods-mathews.com/ This test is not yet approved or cleared by the [place] FDA and  has been authorized for detection and/or diagnosis of SARS-CoV-2 by FDA under an Emergency Use Authorization (EUA). This EUA will remain  in effect (meaning this test can be used) for the duration of the COVID-19 declaration under Section 56 4(b)(1) of the Act, 21 U.S.C. section 360bbb-3(b)(1), unless the authorization is terminated or revoked sooner. Performed at [facility], [place] [address], [place], [place] [address]      Labs: BNP (last 3 results) Recent Labs    04/06/19 0306 09/13/19 1018 12/12/19 2323  BNP 4,003.8* 921.6* 607.3*   Basic Metabolic Panel: Recent Labs  Lab 12/12/19 2322 12/13/19 0447 12/13/19 1228  NA 138 140 138  K 4.3 3.9 4.0  CL 99 103 104  CO2 27 26 26   GLUCOSE 124* 96 99  BUN 38* 37* 35*  CREATININE 1.53* 1.26* 1.16*  CALCIUM 9.2 8.9 8.6*   Liver Function Tests: No results for input(s): AST, ALT, ALKPHOS, BILITOT, PROT, ALBUMIN in the last 168 hours. No results for input(s): LIPASE, AMYLASE in the last 168 hours. No results for input(s): AMMONIA in the last 168 hours. CBC: Recent Labs  Lab 12/12/19 2322 12/13/19 0447  WBC 9.2 8.8  NEUTROABS 6.7  --   HGB 10.5* 10.6*  HCT 32.2* 33.7*  MCV 101.6* 101.5*  PLT 263 251   Cardiac Enzymes: No results for input(s): CKTOTAL, CKMB, CKMBINDEX, TROPONINI in the last 168 hours. BNP: Invalid input(s): POCBNP CBG: Recent Labs  Lab 12/13/19 0813 12/13/19 1210  GLUCAP 90 98   D-Dimer No results for input(s): DDIMER in the last 72 hours. Hgb A1c No results for input(s): HGBA1C in the last 72 hours. Lipid Profile No results for input(s): CHOL, HDL, LDLCALC, TRIG, CHOLHDL, LDLDIRECT in the last 72  hours. Thyroid function studies No results for input(s): TSH, T4TOTAL, T3FREE, THYROIDAB in the last 72 hours.  Invalid input(s): FREET3 Anemia work up No results for input(s): VITAMINB12, FOLATE, FERRITIN, TIBC, IRON, RETICCTPCT in the last 72 hours. Urinalysis    Component Value Date/Time  COLORURINE STRAW (A) 12/13/2019 0815   APPEARANCEUR CLEAR 12/13/2019 0815   LABSPEC 1.011 12/13/2019 0815   PHURINE 7.0 12/13/2019 0815   GLUCOSEU NEGATIVE 12/13/2019 0815   HGBUR NEGATIVE 12/13/2019 0815   BILIRUBINUR NEGATIVE 12/13/2019 0815   BILIRUBINUR Negative 01/13/2017 1234   KETONESUR NEGATIVE 12/13/2019 0815   PROTEINUR NEGATIVE 12/13/2019 0815   UROBILINOGEN 0.2 01/13/2017 1234   UROBILINOGEN 1.0 09/11/2015 0135   NITRITE NEGATIVE 12/13/2019 0815   LEUKOCYTESUR NEGATIVE 12/13/2019 0815   Sepsis Labs Invalid input(s): PROCALCITONIN,  WBC,  LACTICIDVEN Microbiology Recent Results (from the past 240 hour(s))  SARS CORONAVIRUS 2 (TAT 6-24 HRS) Nasopharyngeal Nasopharyngeal Swab     Status: None   Collection Time: 12/13/19  3:00 AM   Specimen: Nasopharyngeal Swab  Result Value Ref Range Status   SARS Coronavirus 2 NEGATIVE NEGATIVE Final    Comment: (NOTE) SARS-CoV-2 target nucleic acids are NOT DETECTED. The SARS-CoV-2 RNA is generally detectable in upper and lower respiratory specimens during the acute phase of infection. Negative results do not preclude SARS-CoV-2 infection, do not rule out co-infections with other pathogens, and should not be used as the sole basis for treatment or other patient management decisions. Negative results must be combined with clinical observations, patient history, and epidemiological information. The expected result is Negative. Fact Sheet for Patients: SugarRoll.be Fact Sheet for Healthcare Providers: https://www.woods-mathews.com/ This test is not yet approved or cleared by the [place] FDA and   has been authorized for detection and/or diagnosis of SARS-CoV-2 by FDA under an Emergency Use Authorization (EUA). This EUA will remain  in effect (meaning this test can be used) for the duration of the COVID-19 declaration under Section 56 4(b)(1) of the Act, 21 U.S.C. section 360bbb-3(b)(1), unless the authorization is terminated or revoked sooner. Performed at [facility], [place] [address] [place], [place] [address]      Time coordinating discharge: 27 minutes  SIGNED:   Darliss Cheney, MD  [facility] 12/13/2019, 2:05 PM  If 7PM-7AM, please contact night-coverage [email or web address]

## 2019-12-14 ENCOUNTER — Telehealth: Payer: Self-pay | Admitting: *Deleted

## 2019-12-14 DIAGNOSIS — I10 Essential (primary) hypertension: Secondary | ICD-10-CM | POA: Diagnosis not present

## 2019-12-14 DIAGNOSIS — R0789 Other chest pain: Secondary | ICD-10-CM | POA: Diagnosis not present

## 2019-12-14 DIAGNOSIS — M25519 Pain in unspecified shoulder: Secondary | ICD-10-CM | POA: Diagnosis not present

## 2019-12-14 DIAGNOSIS — R079 Chest pain, unspecified: Secondary | ICD-10-CM | POA: Diagnosis not present

## 2019-12-14 NOTE — Telephone Encounter (Signed)
Unable to reach pt x1. Pt has hospital follow up scheduled for 12/20/19 w/ PCP.

## 2019-12-15 NOTE — Telephone Encounter (Signed)
Called to do TCM follow up. Dtr states pt is currently sleeping. Pt has appt 12/20/19. Dtr states they will call with any questions if they need before appt.

## 2019-12-18 DIAGNOSIS — R1312 Dysphagia, oropharyngeal phase: Secondary | ICD-10-CM | POA: Diagnosis not present

## 2019-12-18 DIAGNOSIS — I420 Dilated cardiomyopathy: Secondary | ICD-10-CM | POA: Diagnosis not present

## 2019-12-18 DIAGNOSIS — N183 Chronic kidney disease, stage 3 unspecified: Secondary | ICD-10-CM | POA: Diagnosis not present

## 2019-12-18 DIAGNOSIS — M7501 Adhesive capsulitis of right shoulder: Secondary | ICD-10-CM | POA: Diagnosis not present

## 2019-12-18 DIAGNOSIS — I2510 Atherosclerotic heart disease of native coronary artery without angina pectoris: Secondary | ICD-10-CM | POA: Diagnosis not present

## 2019-12-18 DIAGNOSIS — G931 Anoxic brain damage, not elsewhere classified: Secondary | ICD-10-CM | POA: Diagnosis not present

## 2019-12-18 DIAGNOSIS — I130 Hypertensive heart and chronic kidney disease with heart failure and stage 1 through stage 4 chronic kidney disease, or unspecified chronic kidney disease: Secondary | ICD-10-CM | POA: Diagnosis not present

## 2019-12-18 DIAGNOSIS — G8929 Other chronic pain: Secondary | ICD-10-CM | POA: Diagnosis not present

## 2019-12-18 DIAGNOSIS — I5042 Chronic combined systolic (congestive) and diastolic (congestive) heart failure: Secondary | ICD-10-CM | POA: Diagnosis not present

## 2019-12-18 DIAGNOSIS — M797 Fibromyalgia: Secondary | ICD-10-CM | POA: Diagnosis not present

## 2019-12-18 DIAGNOSIS — E1151 Type 2 diabetes mellitus with diabetic peripheral angiopathy without gangrene: Secondary | ICD-10-CM | POA: Diagnosis not present

## 2019-12-18 DIAGNOSIS — D631 Anemia in chronic kidney disease: Secondary | ICD-10-CM | POA: Diagnosis not present

## 2019-12-18 DIAGNOSIS — K5790 Diverticulosis of intestine, part unspecified, without perforation or abscess without bleeding: Secondary | ICD-10-CM | POA: Diagnosis not present

## 2019-12-18 DIAGNOSIS — H538 Other visual disturbances: Secondary | ICD-10-CM | POA: Diagnosis not present

## 2019-12-18 DIAGNOSIS — Z86718 Personal history of other venous thrombosis and embolism: Secondary | ICD-10-CM | POA: Diagnosis not present

## 2019-12-18 DIAGNOSIS — M1990 Unspecified osteoarthritis, unspecified site: Secondary | ICD-10-CM | POA: Diagnosis not present

## 2019-12-18 DIAGNOSIS — I69398 Other sequelae of cerebral infarction: Secondary | ICD-10-CM | POA: Diagnosis not present

## 2019-12-18 DIAGNOSIS — E1122 Type 2 diabetes mellitus with diabetic chronic kidney disease: Secondary | ICD-10-CM | POA: Diagnosis not present

## 2019-12-18 DIAGNOSIS — M48061 Spinal stenosis, lumbar region without neurogenic claudication: Secondary | ICD-10-CM | POA: Diagnosis not present

## 2019-12-18 DIAGNOSIS — I4892 Unspecified atrial flutter: Secondary | ICD-10-CM | POA: Diagnosis not present

## 2019-12-18 DIAGNOSIS — M1A9XX1 Chronic gout, unspecified, with tophus (tophi): Secondary | ICD-10-CM | POA: Diagnosis not present

## 2019-12-18 DIAGNOSIS — M0589 Other rheumatoid arthritis with rheumatoid factor of multiple sites: Secondary | ICD-10-CM | POA: Diagnosis not present

## 2019-12-19 ENCOUNTER — Other Ambulatory Visit: Payer: Self-pay

## 2019-12-19 DIAGNOSIS — I420 Dilated cardiomyopathy: Secondary | ICD-10-CM | POA: Diagnosis not present

## 2019-12-19 DIAGNOSIS — M48061 Spinal stenosis, lumbar region without neurogenic claudication: Secondary | ICD-10-CM | POA: Diagnosis not present

## 2019-12-19 DIAGNOSIS — M0589 Other rheumatoid arthritis with rheumatoid factor of multiple sites: Secondary | ICD-10-CM | POA: Diagnosis not present

## 2019-12-19 DIAGNOSIS — I69398 Other sequelae of cerebral infarction: Secondary | ICD-10-CM | POA: Diagnosis not present

## 2019-12-19 DIAGNOSIS — I5042 Chronic combined systolic (congestive) and diastolic (congestive) heart failure: Secondary | ICD-10-CM | POA: Diagnosis not present

## 2019-12-19 DIAGNOSIS — E1151 Type 2 diabetes mellitus with diabetic peripheral angiopathy without gangrene: Secondary | ICD-10-CM | POA: Diagnosis not present

## 2019-12-19 DIAGNOSIS — I4892 Unspecified atrial flutter: Secondary | ICD-10-CM | POA: Diagnosis not present

## 2019-12-19 DIAGNOSIS — Z86718 Personal history of other venous thrombosis and embolism: Secondary | ICD-10-CM | POA: Diagnosis not present

## 2019-12-19 DIAGNOSIS — N183 Chronic kidney disease, stage 3 unspecified: Secondary | ICD-10-CM | POA: Diagnosis not present

## 2019-12-19 DIAGNOSIS — K5790 Diverticulosis of intestine, part unspecified, without perforation or abscess without bleeding: Secondary | ICD-10-CM | POA: Diagnosis not present

## 2019-12-19 DIAGNOSIS — I2510 Atherosclerotic heart disease of native coronary artery without angina pectoris: Secondary | ICD-10-CM | POA: Diagnosis not present

## 2019-12-19 DIAGNOSIS — I130 Hypertensive heart and chronic kidney disease with heart failure and stage 1 through stage 4 chronic kidney disease, or unspecified chronic kidney disease: Secondary | ICD-10-CM | POA: Diagnosis not present

## 2019-12-19 DIAGNOSIS — D631 Anemia in chronic kidney disease: Secondary | ICD-10-CM | POA: Diagnosis not present

## 2019-12-19 DIAGNOSIS — H538 Other visual disturbances: Secondary | ICD-10-CM | POA: Diagnosis not present

## 2019-12-19 DIAGNOSIS — E1122 Type 2 diabetes mellitus with diabetic chronic kidney disease: Secondary | ICD-10-CM | POA: Diagnosis not present

## 2019-12-19 DIAGNOSIS — M1A9XX1 Chronic gout, unspecified, with tophus (tophi): Secondary | ICD-10-CM | POA: Diagnosis not present

## 2019-12-19 DIAGNOSIS — G8929 Other chronic pain: Secondary | ICD-10-CM | POA: Diagnosis not present

## 2019-12-19 DIAGNOSIS — M7501 Adhesive capsulitis of right shoulder: Secondary | ICD-10-CM | POA: Diagnosis not present

## 2019-12-19 DIAGNOSIS — M1990 Unspecified osteoarthritis, unspecified site: Secondary | ICD-10-CM | POA: Diagnosis not present

## 2019-12-19 DIAGNOSIS — G931 Anoxic brain damage, not elsewhere classified: Secondary | ICD-10-CM | POA: Diagnosis not present

## 2019-12-19 DIAGNOSIS — M797 Fibromyalgia: Secondary | ICD-10-CM | POA: Diagnosis not present

## 2019-12-19 DIAGNOSIS — R1312 Dysphagia, oropharyngeal phase: Secondary | ICD-10-CM | POA: Diagnosis not present

## 2019-12-20 ENCOUNTER — Ambulatory Visit (INDEPENDENT_AMBULATORY_CARE_PROVIDER_SITE_OTHER): Payer: Medicare Other | Admitting: Family Medicine

## 2019-12-20 ENCOUNTER — Encounter: Payer: Self-pay | Admitting: Family Medicine

## 2019-12-20 ENCOUNTER — Telehealth: Payer: Medicare Other | Admitting: Cardiovascular Disease

## 2019-12-20 VITALS — BP 142/90 | HR 84 | Temp 98.2°F | Ht 64.0 in

## 2019-12-20 DIAGNOSIS — N179 Acute kidney failure, unspecified: Secondary | ICD-10-CM

## 2019-12-20 DIAGNOSIS — I5042 Chronic combined systolic (congestive) and diastolic (congestive) heart failure: Secondary | ICD-10-CM | POA: Diagnosis not present

## 2019-12-20 DIAGNOSIS — D539 Nutritional anemia, unspecified: Secondary | ICD-10-CM | POA: Diagnosis not present

## 2019-12-20 LAB — B12 AND FOLATE PANEL
Folate: 13.3 ng/mL (ref >5.9)
Vitamin B-12: 510 pg/mL (ref 211–911)

## 2019-12-20 LAB — BASIC METABOLIC PANEL
BUN: 24 mg/dL — ABNORMAL HIGH (ref 6–23)
CO2: 28 mEq/L (ref 19–32)
Calcium: 9.1 mg/dL (ref 8.4–10.5)
Chloride: 104 mEq/L (ref 96–112)
Creatinine, Ser: 0.85 mg/dL (ref 0.40–1.20)
GFR: 76.43 mL/min (ref >60.00)
Glucose, Bld: 94 mg/dL (ref 70–99)
Potassium: 3.9 mEq/L (ref 3.5–5.1)
Sodium: 139 mEq/L (ref 135–145)

## 2019-12-20 LAB — CBC
HCT: 29.8 % — ABNORMAL LOW (ref 36.0–46.0)
Hemoglobin: 9.8 g/dL — ABNORMAL LOW (ref 12.0–15.0)
MCHC: 33.1 g/dL (ref 30.0–36.0)
MCV: 97.4 fl (ref 78.0–100.0)
Platelets: 279 10*3/uL (ref 150.0–400.0)
RBC: 3.06 Mil/uL — ABNORMAL LOW (ref 3.87–5.11)
RDW: 16.8 % — ABNORMAL HIGH (ref 11.5–15.5)
WBC: 7.2 10*3/uL (ref 4.0–10.5)

## 2019-12-20 LAB — BRAIN NATRIURETIC PEPTIDE: Pro B Natriuretic peptide (BNP): 460 pg/mL — ABNORMAL HIGH (ref 0.0–100.0)

## 2019-12-20 NOTE — Progress Notes (Signed)
[name] [name] is a 86 y.o. female  Chief Complaint  Patient presents with  . Hospitalization Follow-up    Pt was in hospital last Tuesday night.  Pt would like to speak with Dr. Loletha Grayer about what to do from the hospital visit.    HPI: [name] [name] is a 86 y.o. female here for ER f/u, she was kept for 24hr observation. Pt was seen at [facility] ER on 12/12/19 for near-syncope, dehydration, AKI. BP low when EMS arrived. Symptoms resolved with IVF and pt with d/c'd home. Pt has felt fine since that time. No similar episodes. Pts CNA checked BP the last 2 AMs - pt states the readings were normal  Her appetite is good. She is having a BM every other day. She notes having a large BM followed by 2 smaller, looser one. No blood. No pain. Diet has not changed. No fever, chills. No n/v.  She is drinking more water/fluids.    Past Medical History:  Diagnosis Date  . Allergic rhinitis due to pollen 04/27/2007  . Arthritis    "in q joint" (08/07/2013)  . CHF (congestive heart failure) ([place])   . Coronary atherosclerosis of native coronary artery   . Cough   . Edema 05/03/2013  . Exertional shortness of breath    "sometimes" (08/07/2013)  . First degree atrioventricular block   . Gout, unspecified 04/19/2013  . Hyperlipidemia 04/27/2007  . Hypertension   . Midline low back pain without sciatica 06/27/2014  . Muscle spasm   . Muscle weakness (generalized)   . Onychia and paronychia of toe   . Osteoarthrosis, unspecified whether generalized or localized, unspecified site 04/27/2007  . Other fall   . Other malaise and fatigue   . Pneumonia 05/2018  . Prepatellar bursitis   . Pulmonary embolism ([place]) 07/2018  . Rheumatic nodule ([place]) 06/28/2017  . Seizures ([place])   . Stroke ([place]) 01/17/2014  . Type II diabetes mellitus ([place])    "fasting 90-110s" (08/07/2013)  . Unspecified vitamin D deficiency     Past Surgical History:  Procedure Laterality Date  . ABDOMINAL HYSTERECTOMY  04/1980  .  APPENDECTOMY  1960  . CARDIAC CATHETERIZATION  2003  . CATARACT EXTRACTION W/ INTRAOCULAR LENS  IMPLANT, BILATERAL Bilateral ~ 2012  . CHOLECYSTECTOMY N/A 06/26/2018   Procedure: LAPAROSCOPIC CHOLECYSTECTOMY POSSIBLE INTRAOPERATIVE CHOLANGIOGRAM;  Surgeon: Coralie Keens, MD;  Location: [facility];  Service: General;  Laterality: N/A;  . JOINT REPLACEMENT    . REPLACEMENT TOTAL KNEE Right 09/2005  . RIGHT/LEFT HEART CATH AND CORONARY ANGIOGRAPHY N/A 06/01/2018   Procedure: RIGHT/LEFT HEART CATH AND CORONARY ANGIOGRAPHY;  Surgeon: Jettie Booze, MD;  Location: [facility];  Service: Cardiovascular;  Laterality: N/A;  . SHOULDER OPEN ROTATOR CUFF REPAIR Right 07/1999  . TONSILLECTOMY  08/1974  . TOTAL KNEE ARTHROPLASTY Left 08/07/2013   Procedure: TOTAL KNEE ARTHROPLASTY- LEFT;  Surgeon: Vickey Huger, MD;  Location: [facility];  Service: Orthopedics;  Laterality: Left;  . TRANSTHORACIC ECHOCARDIOGRAM  2003   EF 55-65%; mild concentric LVH    Social History   Socioeconomic History  . Marital status: Widowed    Spouse name: Not on file  . Number of children: 3  . Years of education: 69  . Highest education level: High school graduate  Occupational History  . Not on file  Tobacco Use  . Smoking status: Never Smoker  . Smokeless tobacco: Never Used  Substance and Sexual Activity  . Alcohol use: No  Alcohol/week: 0.0 standard drinks  . Drug use: No  . Sexual activity: Not Currently  Other Topics Concern  . Not on file  Social History Narrative   Widowed   Patient is currently unable to ambulate at all.   Social Determinants of Health   Financial Resource Strain: Low Risk   . Difficulty of Paying Living Expenses: Not hard at all  Food Insecurity: Unknown  . Worried About Charity fundraiser in the Last Year: Patient refused  . Ran Out of Food in the Last Year: Patient refused  Transportation Needs: Unknown  . Lack of Transportation (Medical): Patient refused  . Lack of  Transportation (Non-Medical): Patient refused  Physical Activity: Inactive  . Days of Exercise per Week: 0 days  . Minutes of Exercise per Session: 0 min  Stress: No Stress Concern Present  . Feeling of Stress : Not at all  Social Connections: Moderately Isolated  . Frequency of Communication with Friends and Family: More than three times a week  . Frequency of Social Gatherings with Friends and Family: Never  . Attends Religious Services: Never  . Active Member of Clubs or Organizations: No  . Attends Archivist Meetings: Never  . Marital Status: Widowed  Intimate Partner Violence: Unknown  . Fear of Current or Ex-Partner: Patient refused  . Emotionally Abused: Patient refused  . Physically Abused: Patient refused  . Sexually Abused: Patient refused    Family History  Problem Relation Age of Onset  . Heart attack Father      Immunization History  Administered Date(s) Administered  . Influenza Whole 09/06/2012  . Influenza, High Dose Seasonal PF 09/13/2017  . Influenza,inj,Quad PF,6+ Mos 09/12/2013, 09/11/2014, 07/23/2015, 09/16/2016, 09/07/2018  . Pneumococcal Polysaccharide-23 04/13/2005, 11/09/2009, 09/12/2015  . Td 04/13/2005  . Zoster 11/10/2011    Outpatient Encounter Medications as of 12/20/2019  Medication Sig  . acetaminophen (TYLENOL) 500 MG tablet Take 1,000 mg by mouth every 8 (eight) hours as needed for mild pain.   Marland Kitchen albuterol (PROVENTIL HFA;VENTOLIN HFA) 108 (90 Base) MCG/ACT inhaler Inhale 1-2 puffs into the lungs every 6 (six) hours as needed for wheezing or shortness of breath.  . allopurinol (ZYLOPRIM) 100 MG tablet TAKE 1 TABLET BY MOUTH EVERY DAY AT BEDTIME (Patient taking differently: Take 100 mg by mouth at bedtime. )  . Blood Glucose Monitoring Suppl (ACCU-CHEK AVIVA PLUS) w/Device KIT Use to check blood sugars 1-2 times daily.  . carvedilol (COREG) 25 MG tablet TAKE 1 TABLET BY MOUTH TWICE DAILY (Patient taking differently: Take 25 mg by  mouth 2 (two) times daily with a meal. )  . D3-1000 25 MCG (1000 UT) capsule Take 1 capsule (1,000 Units total) by mouth daily.  Marland Kitchen docusate sodium (COLACE) 100 MG capsule Take 1 capsule (100 mg total) by mouth daily as needed for mild constipation.  . ENSURE (ENSURE) Take 237 mLs by mouth every evening.  Marland Kitchen ENTRESTO 49-51 MG TAKE 1 TABLET BY MOUTH EVERY DAY (Patient taking differently: Take 1 tablet by mouth daily. )  . furosemide (LASIX) 40 MG tablet Take 1 tablet (40 mg total) by mouth 2 (two) times daily.  Marland Kitchen gabapentin (NEURONTIN) 100 MG capsule TAKE 1 CAPSULE BY MOUTH THREE TIMES DAILY (Patient taking differently: Take 100 mg by mouth 3 (three) times daily. )  . glucose blood (ACCU-CHEK AVIVA PLUS) test strip Use to test blood sugars 1-2 times daily.  . hydrALAZINE (APRESOLINE) 25 MG tablet Take 1 tablet (25 mg total) by  mouth 3 (three) times daily.  . hydroxychloroquine (PLAQUENIL) 200 MG tablet Take 1 tablet (200 mg total) by mouth 2 (two) times daily.  . isosorbide mononitrate (IMDUR) 30 MG 24 hr tablet TAKE 1 TABLET BY MOUTH EVERY DAY (Patient taking differently: Take 30 mg by mouth daily. )  . JANUVIA 25 MG tablet TAKE 1/2 TABLET BY MOUTH(12,5 MG) EVERY DAY (Patient taking differently: Take 12.5 mg by mouth daily. )  . Lancets (ACCU-CHEK SOFT TOUCH) lancets Use to check blood sugars 1-2 times daily.  . Olopatadine HCl 0.2 % SOLN Place 1 drop into both eyes daily.  . Omega 3-6-9 Fatty Acids (OMEGA 3-6-9 COMPLEX) CAPS Take 1 capsule by mouth daily. Omega 3 Black  . potassium chloride SA (K-DUR) 20 MEQ tablet Take 20 mEq by mouth See admin instructions. Monday and Thursday  . predniSONE (DELTASONE) 5 MG tablet Take 7.5 mg by mouth daily with breakfast.   . Probiotic CAPS Take 1 capsule by mouth daily.  . traMADol (ULTRAM) 50 MG tablet TAKE 1 TO 2 TABLETS BY MOUTH EVERY 6 HOURS AS NEEDED FOR PAIN (Patient taking differently: Take 50-100 mg by mouth every 6 (six) hours as needed for moderate  pain. )  . XARELTO 20 MG TABS tablet TAKE 1 TABLET BY MOUTH EVERY DAY (Patient taking differently: Take 20 mg by mouth daily with supper. )   No facility-administered encounter medications on file as of 12/20/2019.     ROS: Pertinent positives and negatives noted in HPI. Remainder of ROS non-contributory    Allergies  Allergen Reactions  . Clonidine Derivatives Swelling    [name]  . Fish Allergy Diarrhea, Swelling and Other (See Comments)    Turns skin "black," but can tolerate white fish Salmon- Diarrhea  . Shellfish Allergy Hives  . Doxycycline Rash  . Indomethacin Other (See Comments)    Reaction not recalled by the patient  . Lyrica [Pregabalin] Other (See Comments)    Hallucinations   . Methyldopa Other (See Comments)    Aldomet (for hypertension): Reaction not recalled by the patient  . Morphine And Related Other (See Comments)    Family reports it drops her bp that she needs iv fluids  . Orange Fruit [Citrus] Other (See Comments)    Indigestion/heartburn  . Strawberry (Diagnostic) Itching  . Cetirizine Hcl Itching and Rash  . Codeine Itching  . Levaquin [Levofloxacin In D5w] Rash  . Tomato Rash    BP (!) 142/90 (BP Location: Left Arm, Patient Position: Sitting, Cuff Size: Normal)   Pulse 84   Temp 98.2 F (36.8 C) (Temporal)   Ht 5' 4"  (1.626 m)   SpO2 96%   BMI 27.29 kg/m  BP Readings from Last 3 Encounters:  12/20/19 (!) 142/90  12/13/19 (!) 143/76  11/01/19 (!) 143/65   Pulse Readings from Last 3 Encounters:  12/20/19 84  12/13/19 87  11/01/19 87    Physical Exam  Constitutional: She is oriented to person, place, and time. She appears well-developed and well-nourished. No distress.  Cardiovascular: Normal rate, regular rhythm and intact distal pulses.  Pulmonary/Chest: Effort normal and breath sounds normal. No respiratory distress. She has no wheezes. She has no rales.   Musculoskeletal:        General: No edema.  Neurological: She is alert and oriented to person, place, and time.  Skin: Skin is warm and dry.  Psychiatric: She has a normal mood and affect. Her behavior is  normal.     A/P:  1. AKI (acute kidney injury) ([place]) - improved with IVF and pt has increased hydration at home. No further hypotensive or near-syncopal episodes  - Basic metabolic panel  2. Macrocytic anemia - CBC - B12 and Folate Panel  3. Chronic combined systolic and diastolic CHF (congestive heart failure) ([place]) - B Nat Peptide  This visit occurred during the SARS-CoV-2 public health emergency.  Safety protocols were in place, including screening questions prior to the visit, additional usage of staff PPE, and extensive cleaning of exam room while observing appropriate contact time as indicated for disinfecting solutions.

## 2019-12-20 NOTE — Patient Instructions (Signed)
Send BP readings thru MyChart

## 2019-12-21 ENCOUNTER — Encounter: Payer: Self-pay | Admitting: Family Medicine

## 2019-12-21 DIAGNOSIS — I2510 Atherosclerotic heart disease of native coronary artery without angina pectoris: Secondary | ICD-10-CM | POA: Diagnosis not present

## 2019-12-21 DIAGNOSIS — I420 Dilated cardiomyopathy: Secondary | ICD-10-CM | POA: Diagnosis not present

## 2019-12-21 DIAGNOSIS — I130 Hypertensive heart and chronic kidney disease with heart failure and stage 1 through stage 4 chronic kidney disease, or unspecified chronic kidney disease: Secondary | ICD-10-CM | POA: Diagnosis not present

## 2019-12-21 DIAGNOSIS — D631 Anemia in chronic kidney disease: Secondary | ICD-10-CM | POA: Diagnosis not present

## 2019-12-21 DIAGNOSIS — Z86718 Personal history of other venous thrombosis and embolism: Secondary | ICD-10-CM | POA: Diagnosis not present

## 2019-12-21 DIAGNOSIS — M7501 Adhesive capsulitis of right shoulder: Secondary | ICD-10-CM | POA: Diagnosis not present

## 2019-12-21 DIAGNOSIS — E1151 Type 2 diabetes mellitus with diabetic peripheral angiopathy without gangrene: Secondary | ICD-10-CM | POA: Diagnosis not present

## 2019-12-21 DIAGNOSIS — G8929 Other chronic pain: Secondary | ICD-10-CM | POA: Diagnosis not present

## 2019-12-21 DIAGNOSIS — N183 Chronic kidney disease, stage 3 unspecified: Secondary | ICD-10-CM | POA: Diagnosis not present

## 2019-12-21 DIAGNOSIS — M797 Fibromyalgia: Secondary | ICD-10-CM | POA: Diagnosis not present

## 2019-12-21 DIAGNOSIS — K5790 Diverticulosis of intestine, part unspecified, without perforation or abscess without bleeding: Secondary | ICD-10-CM | POA: Diagnosis not present

## 2019-12-21 DIAGNOSIS — M1990 Unspecified osteoarthritis, unspecified site: Secondary | ICD-10-CM | POA: Diagnosis not present

## 2019-12-21 DIAGNOSIS — I5042 Chronic combined systolic (congestive) and diastolic (congestive) heart failure: Secondary | ICD-10-CM | POA: Diagnosis not present

## 2019-12-21 DIAGNOSIS — I4892 Unspecified atrial flutter: Secondary | ICD-10-CM | POA: Diagnosis not present

## 2019-12-21 DIAGNOSIS — G931 Anoxic brain damage, not elsewhere classified: Secondary | ICD-10-CM | POA: Diagnosis not present

## 2019-12-21 DIAGNOSIS — H538 Other visual disturbances: Secondary | ICD-10-CM | POA: Diagnosis not present

## 2019-12-21 DIAGNOSIS — R1312 Dysphagia, oropharyngeal phase: Secondary | ICD-10-CM | POA: Diagnosis not present

## 2019-12-21 DIAGNOSIS — M1A9XX1 Chronic gout, unspecified, with tophus (tophi): Secondary | ICD-10-CM | POA: Diagnosis not present

## 2019-12-21 DIAGNOSIS — M48061 Spinal stenosis, lumbar region without neurogenic claudication: Secondary | ICD-10-CM | POA: Diagnosis not present

## 2019-12-21 DIAGNOSIS — I69398 Other sequelae of cerebral infarction: Secondary | ICD-10-CM | POA: Diagnosis not present

## 2019-12-21 DIAGNOSIS — E1122 Type 2 diabetes mellitus with diabetic chronic kidney disease: Secondary | ICD-10-CM | POA: Diagnosis not present

## 2019-12-21 DIAGNOSIS — M0589 Other rheumatoid arthritis with rheumatoid factor of multiple sites: Secondary | ICD-10-CM | POA: Diagnosis not present

## 2019-12-22 ENCOUNTER — Other Ambulatory Visit: Payer: Self-pay | Admitting: Family Medicine

## 2019-12-24 ENCOUNTER — Other Ambulatory Visit: Payer: Self-pay | Admitting: Family Medicine

## 2019-12-24 DIAGNOSIS — H1013 Acute atopic conjunctivitis, bilateral: Secondary | ICD-10-CM

## 2019-12-25 ENCOUNTER — Telehealth: Payer: Self-pay | Admitting: Family Medicine

## 2019-12-25 NOTE — Telephone Encounter (Signed)
Patient is requesting Prednisone be sent to [facility] on [address]. CB is [phone]

## 2019-12-26 ENCOUNTER — Other Ambulatory Visit: Payer: Self-pay

## 2019-12-26 DIAGNOSIS — G931 Anoxic brain damage, not elsewhere classified: Secondary | ICD-10-CM | POA: Diagnosis not present

## 2019-12-26 DIAGNOSIS — I130 Hypertensive heart and chronic kidney disease with heart failure and stage 1 through stage 4 chronic kidney disease, or unspecified chronic kidney disease: Secondary | ICD-10-CM | POA: Diagnosis not present

## 2019-12-26 DIAGNOSIS — E1122 Type 2 diabetes mellitus with diabetic chronic kidney disease: Secondary | ICD-10-CM | POA: Diagnosis not present

## 2019-12-26 DIAGNOSIS — H538 Other visual disturbances: Secondary | ICD-10-CM | POA: Diagnosis not present

## 2019-12-26 DIAGNOSIS — M48061 Spinal stenosis, lumbar region without neurogenic claudication: Secondary | ICD-10-CM | POA: Diagnosis not present

## 2019-12-26 DIAGNOSIS — N183 Chronic kidney disease, stage 3 unspecified: Secondary | ICD-10-CM | POA: Diagnosis not present

## 2019-12-26 DIAGNOSIS — R1312 Dysphagia, oropharyngeal phase: Secondary | ICD-10-CM | POA: Diagnosis not present

## 2019-12-26 DIAGNOSIS — G8929 Other chronic pain: Secondary | ICD-10-CM | POA: Diagnosis not present

## 2019-12-26 DIAGNOSIS — M1A9XX1 Chronic gout, unspecified, with tophus (tophi): Secondary | ICD-10-CM | POA: Diagnosis not present

## 2019-12-26 DIAGNOSIS — M7501 Adhesive capsulitis of right shoulder: Secondary | ICD-10-CM | POA: Diagnosis not present

## 2019-12-26 DIAGNOSIS — M797 Fibromyalgia: Secondary | ICD-10-CM | POA: Diagnosis not present

## 2019-12-26 DIAGNOSIS — M0589 Other rheumatoid arthritis with rheumatoid factor of multiple sites: Secondary | ICD-10-CM | POA: Diagnosis not present

## 2019-12-26 DIAGNOSIS — D631 Anemia in chronic kidney disease: Secondary | ICD-10-CM | POA: Diagnosis not present

## 2019-12-26 DIAGNOSIS — I5042 Chronic combined systolic (congestive) and diastolic (congestive) heart failure: Secondary | ICD-10-CM | POA: Diagnosis not present

## 2019-12-26 DIAGNOSIS — I4892 Unspecified atrial flutter: Secondary | ICD-10-CM | POA: Diagnosis not present

## 2019-12-26 DIAGNOSIS — E1151 Type 2 diabetes mellitus with diabetic peripheral angiopathy without gangrene: Secondary | ICD-10-CM | POA: Diagnosis not present

## 2019-12-26 DIAGNOSIS — I69398 Other sequelae of cerebral infarction: Secondary | ICD-10-CM | POA: Diagnosis not present

## 2019-12-26 DIAGNOSIS — I2510 Atherosclerotic heart disease of native coronary artery without angina pectoris: Secondary | ICD-10-CM | POA: Diagnosis not present

## 2019-12-26 DIAGNOSIS — K5790 Diverticulosis of intestine, part unspecified, without perforation or abscess without bleeding: Secondary | ICD-10-CM | POA: Diagnosis not present

## 2019-12-26 DIAGNOSIS — I420 Dilated cardiomyopathy: Secondary | ICD-10-CM | POA: Diagnosis not present

## 2019-12-26 DIAGNOSIS — Z8616 Personal history of COVID-19: Secondary | ICD-10-CM | POA: Diagnosis not present

## 2019-12-26 DIAGNOSIS — M1990 Unspecified osteoarthritis, unspecified site: Secondary | ICD-10-CM | POA: Diagnosis not present

## 2019-12-26 NOTE — Telephone Encounter (Signed)
MC-Plz see refill req/thx dmf 

## 2019-12-26 NOTE — Telephone Encounter (Signed)
Last fill historical provider

## 2019-12-26 NOTE — Telephone Encounter (Signed)
Refill request sent to Dr. Salena Saner for the ok.

## 2019-12-27 ENCOUNTER — Encounter: Payer: Self-pay | Admitting: Family Medicine

## 2019-12-27 MED ORDER — PREDNISONE 5 MG PO TABS: 7.5000 mg | ORAL_TABLET | Freq: Every day | ORAL | 3 refills | Status: DC

## 2019-12-27 NOTE — Addendum Note (Signed)
Addended by: Overton Mam on: 12/27/2019 08:05 AM   Modules accepted: Orders

## 2019-12-27 NOTE — Telephone Encounter (Signed)
Rx refilled.

## 2019-12-29 ENCOUNTER — Encounter: Payer: Self-pay | Admitting: Family Medicine

## 2019-12-29 ENCOUNTER — Telehealth: Payer: Self-pay | Admitting: Family Medicine

## 2019-12-29 NOTE — Telephone Encounter (Signed)
Patient daughter is calling and stated that patient is scheduled to get first dosage of the vaccine and patient has been experiencing high blood pressure and wanted to see if it was ok to still take the vaccine. CB [phone]

## 2019-12-29 NOTE — Telephone Encounter (Signed)
I spoke w/pt's daughter and she informed me that pt's bp had been elevated.  Yesterday it was 170/85 and today it had been as high as 160/90.  Daughter wanted to know if pt was still ok to get her Covid vaccine tomorrow.  Pt started taking Claritin starting yesterday for sinus issues and wanted to know if that could be the cause of bp elevation.  After speaking w/ Dr. Salena Saner in regards to message, she informed me that the pt could increase her dosage of Imdur to 60mg  QD verses 30mg  QD. Dr. also said pt's daughter should check bp up to twice a day at least 1 hr after taking bp medication.  Dr. explains that if pt is taking Claritin D then she should stop that but regular Claritin is fine for pt to take.  Dr. Salena Saner said that the pt should still get her Covid vaccine and that bp should not affect injection but if pt should run a fever then she'll have to reschedule her vaccine injection appointment.  Daughter informed.

## 2019-12-30 ENCOUNTER — Ambulatory Visit: Payer: Medicare Other

## 2020-01-01 ENCOUNTER — Telehealth: Payer: Self-pay | Admitting: Family Medicine

## 2020-01-01 ENCOUNTER — Encounter: Payer: Self-pay | Admitting: Family Medicine

## 2020-01-01 DIAGNOSIS — I2510 Atherosclerotic heart disease of native coronary artery without angina pectoris: Secondary | ICD-10-CM | POA: Diagnosis not present

## 2020-01-01 DIAGNOSIS — M48061 Spinal stenosis, lumbar region without neurogenic claudication: Secondary | ICD-10-CM | POA: Diagnosis not present

## 2020-01-01 DIAGNOSIS — I69398 Other sequelae of cerebral infarction: Secondary | ICD-10-CM | POA: Diagnosis not present

## 2020-01-01 DIAGNOSIS — E1122 Type 2 diabetes mellitus with diabetic chronic kidney disease: Secondary | ICD-10-CM | POA: Diagnosis not present

## 2020-01-01 DIAGNOSIS — I130 Hypertensive heart and chronic kidney disease with heart failure and stage 1 through stage 4 chronic kidney disease, or unspecified chronic kidney disease: Secondary | ICD-10-CM | POA: Diagnosis not present

## 2020-01-01 DIAGNOSIS — M1A9XX1 Chronic gout, unspecified, with tophus (tophi): Secondary | ICD-10-CM | POA: Diagnosis not present

## 2020-01-01 DIAGNOSIS — M4800 Spinal stenosis, site unspecified: Secondary | ICD-10-CM | POA: Diagnosis not present

## 2020-01-01 DIAGNOSIS — K5790 Diverticulosis of intestine, part unspecified, without perforation or abscess without bleeding: Secondary | ICD-10-CM | POA: Diagnosis not present

## 2020-01-01 DIAGNOSIS — R1312 Dysphagia, oropharyngeal phase: Secondary | ICD-10-CM | POA: Diagnosis not present

## 2020-01-01 DIAGNOSIS — I420 Dilated cardiomyopathy: Secondary | ICD-10-CM | POA: Diagnosis not present

## 2020-01-01 DIAGNOSIS — E1151 Type 2 diabetes mellitus with diabetic peripheral angiopathy without gangrene: Secondary | ICD-10-CM | POA: Diagnosis not present

## 2020-01-01 DIAGNOSIS — M797 Fibromyalgia: Secondary | ICD-10-CM | POA: Diagnosis not present

## 2020-01-01 DIAGNOSIS — M7501 Adhesive capsulitis of right shoulder: Secondary | ICD-10-CM | POA: Diagnosis not present

## 2020-01-01 DIAGNOSIS — G931 Anoxic brain damage, not elsewhere classified: Secondary | ICD-10-CM | POA: Diagnosis not present

## 2020-01-01 DIAGNOSIS — D631 Anemia in chronic kidney disease: Secondary | ICD-10-CM | POA: Diagnosis not present

## 2020-01-01 DIAGNOSIS — I5042 Chronic combined systolic (congestive) and diastolic (congestive) heart failure: Secondary | ICD-10-CM | POA: Diagnosis not present

## 2020-01-01 DIAGNOSIS — G8929 Other chronic pain: Secondary | ICD-10-CM | POA: Diagnosis not present

## 2020-01-01 DIAGNOSIS — Z8616 Personal history of COVID-19: Secondary | ICD-10-CM | POA: Diagnosis not present

## 2020-01-01 DIAGNOSIS — N183 Chronic kidney disease, stage 3 unspecified: Secondary | ICD-10-CM | POA: Diagnosis not present

## 2020-01-01 DIAGNOSIS — M1990 Unspecified osteoarthritis, unspecified site: Secondary | ICD-10-CM | POA: Diagnosis not present

## 2020-01-01 DIAGNOSIS — I4892 Unspecified atrial flutter: Secondary | ICD-10-CM | POA: Diagnosis not present

## 2020-01-01 DIAGNOSIS — M0589 Other rheumatoid arthritis with rheumatoid factor of multiple sites: Secondary | ICD-10-CM | POA: Diagnosis not present

## 2020-01-01 DIAGNOSIS — H538 Other visual disturbances: Secondary | ICD-10-CM | POA: Diagnosis not present

## 2020-01-01 MED ORDER — SITAGLIPTIN PHOSPHATE 25 MG PO TABS: ORAL_TABLET | ORAL | 3 refills | Status: DC

## 2020-01-01 MED ORDER — RIVAROXABAN 20 MG PO TABS: 20.0000 mg | ORAL_TABLET | Freq: Every day | ORAL | 0 refills | Status: DC

## 2020-01-01 NOTE — Telephone Encounter (Signed)
Last OV 12/20/19 Last fill 09/21/19  #45/3 Januvia Last fill 10/03/19  #30/0 Xarelto

## 2020-01-01 NOTE — Telephone Encounter (Signed)
Rx sent in today. 

## 2020-01-01 NOTE — Telephone Encounter (Signed)
Patient daughter is calling and requesting a refill for [place]  and xaralto sent to [facility] on [address]. CB is [phone]

## 2020-01-02 NOTE — Telephone Encounter (Signed)
Patient [name] callling back and stated that facility stated that they never received the paperwork that was faxed. Pls advise. CB is [phone]

## 2020-01-02 NOTE — Telephone Encounter (Signed)
Paperwork has been refax as requested.

## 2020-01-02 NOTE — Telephone Encounter (Signed)
I spoke with pt's daughter and she informed me that the [facility] form along w/ medication list and chart notes need to be faxed   to [facility] in [place], [place], [place] # [phone].  I informed pt's daughter that Dr. Salena Saner is out of office today and that I will give her a call back once this task in finished.  I did some investigating and found out that the facility has to fax [place] the FL2 forms, and then we fill it out and fax it back.  Pt's daughter is aware.  Fax number given to daughter.

## 2020-01-04 ENCOUNTER — Ambulatory Visit: Payer: Medicare Other | Attending: Internal Medicine

## 2020-01-04 DIAGNOSIS — Z23 Encounter for immunization: Secondary | ICD-10-CM | POA: Insufficient documentation

## 2020-01-04 NOTE — Progress Notes (Signed)
   Covid-19 Vaccination Clinic  Name:  KHALIDAH HERBOLD    MRN: [id number] DOB: November 09, 1930  01/04/2020  Ms. Gafford was observed post Covid-19 immunization for 15 minutes without incidence. She was provided with Vaccine Information Sheet and instruction to access the V-Safe system.   Ms. Carmack was instructed to call 911 with any severe reactions post vaccine: Marland Kitchen Difficulty breathing  . Swelling of your face and throat  . A fast heartbeat  . A bad rash all over your body  . Dizziness and weakness    Immunizations Administered    Name Date Dose VIS Date Route   Pfizer COVID-19 Vaccine 01/04/2020 12:32 PM 0.3 mL 10/20/2019 Intramuscular   Manufacturer: [employer], [employer]   Lot: [id number]   NDC: [address]-7

## 2020-01-05 ENCOUNTER — Encounter: Payer: Self-pay | Admitting: Family Medicine

## 2020-01-08 DIAGNOSIS — L88 Pyoderma gangrenosum: Secondary | ICD-10-CM | POA: Diagnosis not present

## 2020-01-08 DIAGNOSIS — M259 Joint disorder, unspecified: Secondary | ICD-10-CM | POA: Diagnosis not present

## 2020-01-08 DIAGNOSIS — L97212 Non-pressure chronic ulcer of right calf with fat layer exposed: Secondary | ICD-10-CM | POA: Diagnosis not present

## 2020-01-08 DIAGNOSIS — I10 Essential (primary) hypertension: Secondary | ICD-10-CM | POA: Diagnosis not present

## 2020-01-09 DIAGNOSIS — M7501 Adhesive capsulitis of right shoulder: Secondary | ICD-10-CM | POA: Diagnosis not present

## 2020-01-09 DIAGNOSIS — N183 Chronic kidney disease, stage 3 unspecified: Secondary | ICD-10-CM | POA: Diagnosis not present

## 2020-01-09 DIAGNOSIS — H538 Other visual disturbances: Secondary | ICD-10-CM | POA: Diagnosis not present

## 2020-01-09 DIAGNOSIS — K5790 Diverticulosis of intestine, part unspecified, without perforation or abscess without bleeding: Secondary | ICD-10-CM | POA: Diagnosis not present

## 2020-01-09 DIAGNOSIS — D631 Anemia in chronic kidney disease: Secondary | ICD-10-CM | POA: Diagnosis not present

## 2020-01-09 DIAGNOSIS — I420 Dilated cardiomyopathy: Secondary | ICD-10-CM | POA: Diagnosis not present

## 2020-01-09 DIAGNOSIS — M48061 Spinal stenosis, lumbar region without neurogenic claudication: Secondary | ICD-10-CM | POA: Diagnosis not present

## 2020-01-09 DIAGNOSIS — E1122 Type 2 diabetes mellitus with diabetic chronic kidney disease: Secondary | ICD-10-CM | POA: Diagnosis not present

## 2020-01-09 DIAGNOSIS — I69398 Other sequelae of cerebral infarction: Secondary | ICD-10-CM | POA: Diagnosis not present

## 2020-01-09 DIAGNOSIS — R1312 Dysphagia, oropharyngeal phase: Secondary | ICD-10-CM | POA: Diagnosis not present

## 2020-01-09 DIAGNOSIS — I5042 Chronic combined systolic (congestive) and diastolic (congestive) heart failure: Secondary | ICD-10-CM | POA: Diagnosis not present

## 2020-01-09 DIAGNOSIS — Z8616 Personal history of COVID-19: Secondary | ICD-10-CM | POA: Diagnosis not present

## 2020-01-09 DIAGNOSIS — G931 Anoxic brain damage, not elsewhere classified: Secondary | ICD-10-CM | POA: Diagnosis not present

## 2020-01-09 DIAGNOSIS — I130 Hypertensive heart and chronic kidney disease with heart failure and stage 1 through stage 4 chronic kidney disease, or unspecified chronic kidney disease: Secondary | ICD-10-CM | POA: Diagnosis not present

## 2020-01-09 DIAGNOSIS — M0589 Other rheumatoid arthritis with rheumatoid factor of multiple sites: Secondary | ICD-10-CM | POA: Diagnosis not present

## 2020-01-09 DIAGNOSIS — M797 Fibromyalgia: Secondary | ICD-10-CM | POA: Diagnosis not present

## 2020-01-09 DIAGNOSIS — I2510 Atherosclerotic heart disease of native coronary artery without angina pectoris: Secondary | ICD-10-CM | POA: Diagnosis not present

## 2020-01-09 DIAGNOSIS — G8929 Other chronic pain: Secondary | ICD-10-CM | POA: Diagnosis not present

## 2020-01-09 DIAGNOSIS — E1151 Type 2 diabetes mellitus with diabetic peripheral angiopathy without gangrene: Secondary | ICD-10-CM | POA: Diagnosis not present

## 2020-01-09 DIAGNOSIS — I4892 Unspecified atrial flutter: Secondary | ICD-10-CM | POA: Diagnosis not present

## 2020-01-09 DIAGNOSIS — M1990 Unspecified osteoarthritis, unspecified site: Secondary | ICD-10-CM | POA: Diagnosis not present

## 2020-01-09 DIAGNOSIS — M1A9XX1 Chronic gout, unspecified, with tophus (tophi): Secondary | ICD-10-CM | POA: Diagnosis not present

## 2020-01-11 DIAGNOSIS — M48061 Spinal stenosis, lumbar region without neurogenic claudication: Secondary | ICD-10-CM | POA: Diagnosis not present

## 2020-01-11 DIAGNOSIS — G8929 Other chronic pain: Secondary | ICD-10-CM | POA: Diagnosis not present

## 2020-01-11 DIAGNOSIS — M1A9XX1 Chronic gout, unspecified, with tophus (tophi): Secondary | ICD-10-CM | POA: Diagnosis not present

## 2020-01-11 DIAGNOSIS — G931 Anoxic brain damage, not elsewhere classified: Secondary | ICD-10-CM | POA: Diagnosis not present

## 2020-01-11 DIAGNOSIS — R1312 Dysphagia, oropharyngeal phase: Secondary | ICD-10-CM | POA: Diagnosis not present

## 2020-01-11 DIAGNOSIS — I420 Dilated cardiomyopathy: Secondary | ICD-10-CM | POA: Diagnosis not present

## 2020-01-11 DIAGNOSIS — I69398 Other sequelae of cerebral infarction: Secondary | ICD-10-CM | POA: Diagnosis not present

## 2020-01-11 DIAGNOSIS — M7501 Adhesive capsulitis of right shoulder: Secondary | ICD-10-CM | POA: Diagnosis not present

## 2020-01-11 DIAGNOSIS — D631 Anemia in chronic kidney disease: Secondary | ICD-10-CM | POA: Diagnosis not present

## 2020-01-11 DIAGNOSIS — M0589 Other rheumatoid arthritis with rheumatoid factor of multiple sites: Secondary | ICD-10-CM | POA: Diagnosis not present

## 2020-01-11 DIAGNOSIS — I130 Hypertensive heart and chronic kidney disease with heart failure and stage 1 through stage 4 chronic kidney disease, or unspecified chronic kidney disease: Secondary | ICD-10-CM | POA: Diagnosis not present

## 2020-01-11 DIAGNOSIS — I4892 Unspecified atrial flutter: Secondary | ICD-10-CM | POA: Diagnosis not present

## 2020-01-11 DIAGNOSIS — I5042 Chronic combined systolic (congestive) and diastolic (congestive) heart failure: Secondary | ICD-10-CM | POA: Diagnosis not present

## 2020-01-11 DIAGNOSIS — E1151 Type 2 diabetes mellitus with diabetic peripheral angiopathy without gangrene: Secondary | ICD-10-CM | POA: Diagnosis not present

## 2020-01-11 DIAGNOSIS — N183 Chronic kidney disease, stage 3 unspecified: Secondary | ICD-10-CM | POA: Diagnosis not present

## 2020-01-11 DIAGNOSIS — H538 Other visual disturbances: Secondary | ICD-10-CM | POA: Diagnosis not present

## 2020-01-11 DIAGNOSIS — M797 Fibromyalgia: Secondary | ICD-10-CM | POA: Diagnosis not present

## 2020-01-11 DIAGNOSIS — M1990 Unspecified osteoarthritis, unspecified site: Secondary | ICD-10-CM | POA: Diagnosis not present

## 2020-01-11 DIAGNOSIS — K5790 Diverticulosis of intestine, part unspecified, without perforation or abscess without bleeding: Secondary | ICD-10-CM | POA: Diagnosis not present

## 2020-01-11 DIAGNOSIS — Z8616 Personal history of COVID-19: Secondary | ICD-10-CM | POA: Diagnosis not present

## 2020-01-11 DIAGNOSIS — E1122 Type 2 diabetes mellitus with diabetic chronic kidney disease: Secondary | ICD-10-CM | POA: Diagnosis not present

## 2020-01-11 DIAGNOSIS — I2510 Atherosclerotic heart disease of native coronary artery without angina pectoris: Secondary | ICD-10-CM | POA: Diagnosis not present

## 2020-01-16 ENCOUNTER — Other Ambulatory Visit: Payer: Self-pay | Admitting: Internal Medicine

## 2020-01-16 ENCOUNTER — Other Ambulatory Visit: Payer: Self-pay | Admitting: Family Medicine

## 2020-01-17 DIAGNOSIS — R1312 Dysphagia, oropharyngeal phase: Secondary | ICD-10-CM | POA: Diagnosis not present

## 2020-01-17 DIAGNOSIS — M797 Fibromyalgia: Secondary | ICD-10-CM | POA: Diagnosis not present

## 2020-01-17 DIAGNOSIS — I4892 Unspecified atrial flutter: Secondary | ICD-10-CM | POA: Diagnosis not present

## 2020-01-17 DIAGNOSIS — G931 Anoxic brain damage, not elsewhere classified: Secondary | ICD-10-CM | POA: Diagnosis not present

## 2020-01-17 DIAGNOSIS — K5790 Diverticulosis of intestine, part unspecified, without perforation or abscess without bleeding: Secondary | ICD-10-CM | POA: Diagnosis not present

## 2020-01-17 DIAGNOSIS — M7501 Adhesive capsulitis of right shoulder: Secondary | ICD-10-CM | POA: Diagnosis not present

## 2020-01-17 DIAGNOSIS — I69398 Other sequelae of cerebral infarction: Secondary | ICD-10-CM | POA: Diagnosis not present

## 2020-01-17 DIAGNOSIS — I420 Dilated cardiomyopathy: Secondary | ICD-10-CM | POA: Diagnosis not present

## 2020-01-17 DIAGNOSIS — H538 Other visual disturbances: Secondary | ICD-10-CM | POA: Diagnosis not present

## 2020-01-17 DIAGNOSIS — M1990 Unspecified osteoarthritis, unspecified site: Secondary | ICD-10-CM | POA: Diagnosis not present

## 2020-01-17 DIAGNOSIS — M0589 Other rheumatoid arthritis with rheumatoid factor of multiple sites: Secondary | ICD-10-CM | POA: Diagnosis not present

## 2020-01-17 DIAGNOSIS — I2510 Atherosclerotic heart disease of native coronary artery without angina pectoris: Secondary | ICD-10-CM | POA: Diagnosis not present

## 2020-01-17 DIAGNOSIS — G8929 Other chronic pain: Secondary | ICD-10-CM | POA: Diagnosis not present

## 2020-01-17 DIAGNOSIS — E1122 Type 2 diabetes mellitus with diabetic chronic kidney disease: Secondary | ICD-10-CM | POA: Diagnosis not present

## 2020-01-17 DIAGNOSIS — D631 Anemia in chronic kidney disease: Secondary | ICD-10-CM | POA: Diagnosis not present

## 2020-01-17 DIAGNOSIS — N183 Chronic kidney disease, stage 3 unspecified: Secondary | ICD-10-CM | POA: Diagnosis not present

## 2020-01-17 DIAGNOSIS — E1151 Type 2 diabetes mellitus with diabetic peripheral angiopathy without gangrene: Secondary | ICD-10-CM | POA: Diagnosis not present

## 2020-01-17 DIAGNOSIS — Z8616 Personal history of COVID-19: Secondary | ICD-10-CM | POA: Diagnosis not present

## 2020-01-17 DIAGNOSIS — I130 Hypertensive heart and chronic kidney disease with heart failure and stage 1 through stage 4 chronic kidney disease, or unspecified chronic kidney disease: Secondary | ICD-10-CM | POA: Diagnosis not present

## 2020-01-17 DIAGNOSIS — M48061 Spinal stenosis, lumbar region without neurogenic claudication: Secondary | ICD-10-CM | POA: Diagnosis not present

## 2020-01-17 DIAGNOSIS — I5042 Chronic combined systolic (congestive) and diastolic (congestive) heart failure: Secondary | ICD-10-CM | POA: Diagnosis not present

## 2020-01-17 DIAGNOSIS — M1A9XX1 Chronic gout, unspecified, with tophus (tophi): Secondary | ICD-10-CM | POA: Diagnosis not present

## 2020-01-17 NOTE — Telephone Encounter (Signed)
Requesting: Tramadol Contract: None UDS: None Last OV: 2.10.21 Next OV: Not scheduled Last Refill: 12.16.2020   Please advise

## 2020-01-20 ENCOUNTER — Encounter: Payer: Self-pay | Admitting: Family Medicine

## 2020-01-22 ENCOUNTER — Encounter: Payer: Self-pay | Admitting: Family Medicine

## 2020-01-22 ENCOUNTER — Telehealth: Payer: Self-pay

## 2020-01-22 ENCOUNTER — Other Ambulatory Visit: Payer: Self-pay

## 2020-01-22 DIAGNOSIS — E1159 Type 2 diabetes mellitus with other circulatory complications: Secondary | ICD-10-CM

## 2020-01-22 DIAGNOSIS — I420 Dilated cardiomyopathy: Secondary | ICD-10-CM | POA: Diagnosis not present

## 2020-01-22 DIAGNOSIS — R1312 Dysphagia, oropharyngeal phase: Secondary | ICD-10-CM | POA: Diagnosis not present

## 2020-01-22 DIAGNOSIS — D631 Anemia in chronic kidney disease: Secondary | ICD-10-CM | POA: Diagnosis not present

## 2020-01-22 DIAGNOSIS — M48061 Spinal stenosis, lumbar region without neurogenic claudication: Secondary | ICD-10-CM | POA: Diagnosis not present

## 2020-01-22 DIAGNOSIS — E1121 Type 2 diabetes mellitus with diabetic nephropathy: Secondary | ICD-10-CM

## 2020-01-22 DIAGNOSIS — I5042 Chronic combined systolic (congestive) and diastolic (congestive) heart failure: Secondary | ICD-10-CM | POA: Diagnosis not present

## 2020-01-22 DIAGNOSIS — M1990 Unspecified osteoarthritis, unspecified site: Secondary | ICD-10-CM | POA: Diagnosis not present

## 2020-01-22 DIAGNOSIS — H538 Other visual disturbances: Secondary | ICD-10-CM | POA: Diagnosis not present

## 2020-01-22 DIAGNOSIS — E1151 Type 2 diabetes mellitus with diabetic peripheral angiopathy without gangrene: Secondary | ICD-10-CM | POA: Diagnosis not present

## 2020-01-22 DIAGNOSIS — I130 Hypertensive heart and chronic kidney disease with heart failure and stage 1 through stage 4 chronic kidney disease, or unspecified chronic kidney disease: Secondary | ICD-10-CM | POA: Diagnosis not present

## 2020-01-22 DIAGNOSIS — M7501 Adhesive capsulitis of right shoulder: Secondary | ICD-10-CM | POA: Diagnosis not present

## 2020-01-22 DIAGNOSIS — Z8616 Personal history of COVID-19: Secondary | ICD-10-CM | POA: Diagnosis not present

## 2020-01-22 DIAGNOSIS — I4892 Unspecified atrial flutter: Secondary | ICD-10-CM | POA: Diagnosis not present

## 2020-01-22 DIAGNOSIS — I69398 Other sequelae of cerebral infarction: Secondary | ICD-10-CM | POA: Diagnosis not present

## 2020-01-22 DIAGNOSIS — G8929 Other chronic pain: Secondary | ICD-10-CM | POA: Diagnosis not present

## 2020-01-22 DIAGNOSIS — M0589 Other rheumatoid arthritis with rheumatoid factor of multiple sites: Secondary | ICD-10-CM | POA: Diagnosis not present

## 2020-01-22 DIAGNOSIS — I152 Hypertension secondary to endocrine disorders: Secondary | ICD-10-CM

## 2020-01-22 DIAGNOSIS — M797 Fibromyalgia: Secondary | ICD-10-CM | POA: Diagnosis not present

## 2020-01-22 DIAGNOSIS — D539 Nutritional anemia, unspecified: Secondary | ICD-10-CM

## 2020-01-22 DIAGNOSIS — E1122 Type 2 diabetes mellitus with diabetic chronic kidney disease: Secondary | ICD-10-CM | POA: Diagnosis not present

## 2020-01-22 DIAGNOSIS — I2510 Atherosclerotic heart disease of native coronary artery without angina pectoris: Secondary | ICD-10-CM | POA: Diagnosis not present

## 2020-01-22 DIAGNOSIS — K5790 Diverticulosis of intestine, part unspecified, without perforation or abscess without bleeding: Secondary | ICD-10-CM | POA: Diagnosis not present

## 2020-01-22 DIAGNOSIS — G931 Anoxic brain damage, not elsewhere classified: Secondary | ICD-10-CM | POA: Diagnosis not present

## 2020-01-22 DIAGNOSIS — M1A9XX1 Chronic gout, unspecified, with tophus (tophi): Secondary | ICD-10-CM | POA: Diagnosis not present

## 2020-01-22 DIAGNOSIS — N183 Chronic kidney disease, stage 3 unspecified: Secondary | ICD-10-CM | POA: Diagnosis not present

## 2020-01-22 NOTE — Telephone Encounter (Signed)
I received a telephone message from [employer] this morning, call was made yesterday during closing hours.  Message in regards to pt [name] [name] due to pt's request for home health facility in [place].  [facility] was requesting a peer to peer conversation with [name] [name] by 11am today.  I called phone # given and spoke with a Pre Service Coordinator, [name] [name] to inform him that dr. Salena Saner will not be in office until Wednesday of this week.  [name] [name] then informed me that he will try to adjust the time for peer to peer conversation but that he may be able to adjust the time, which will result to denial for pt's request.  [name] [name] explained that if it is denied then an appeal can be done upon request.  He did not inform me to whom would have to appeal the request.  I will send message to [name] [name] for FYI.

## 2020-01-22 NOTE — Telephone Encounter (Signed)
Last fill 08/29/19 #20/0  Med was discontinued Ok to fill? Last OV 12/20/2019

## 2020-01-22 NOTE — Telephone Encounter (Signed)
I talked to Dr. Laural Benes with [facility] plan. She informed me that Ms. Francesconi does not qualify for SNF for the following reasons: she has not shown any decline in physical status, and per PT/OT eval on 01/17/2020 she has shown improvement. I asked if this decision can be appealed or another order needs to be entered by Dr. Barron Alvine.  Dr. Laural Benes stated an appeal can be submitted or contact a social worker to inquire about other possible community services to help Ms.Busta.

## 2020-01-22 NOTE — Telephone Encounter (Signed)
Nathen from [facility] called back and informed me that he was able to extend the deadline for the peer to peer conversation until 6pm today in regards to pt.  I spoke with Alysia Penna, Doc of the day, to ask if she will be willing to have this peer to peer conversation in regards to pt before 6pm today.  Claris Gower agreed to have conversation,  She will look at pt's records for any updates and help to the best of her knowledge.  The phone number given is [phone] option #5.

## 2020-01-22 NOTE — Telephone Encounter (Signed)
Made in error

## 2020-01-23 ENCOUNTER — Encounter: Payer: Self-pay | Admitting: Family Medicine

## 2020-01-23 DIAGNOSIS — R61 Generalized hyperhidrosis: Secondary | ICD-10-CM | POA: Diagnosis not present

## 2020-01-23 DIAGNOSIS — I10 Essential (primary) hypertension: Secondary | ICD-10-CM | POA: Diagnosis not present

## 2020-01-23 DIAGNOSIS — R55 Syncope and collapse: Secondary | ICD-10-CM | POA: Diagnosis not present

## 2020-01-23 DIAGNOSIS — M109 Gout, unspecified: Secondary | ICD-10-CM | POA: Diagnosis not present

## 2020-01-23 DIAGNOSIS — N189 Chronic kidney disease, unspecified: Secondary | ICD-10-CM | POA: Diagnosis not present

## 2020-01-23 DIAGNOSIS — E1169 Type 2 diabetes mellitus with other specified complication: Secondary | ICD-10-CM | POA: Diagnosis not present

## 2020-01-23 DIAGNOSIS — M069 Rheumatoid arthritis, unspecified: Secondary | ICD-10-CM | POA: Diagnosis not present

## 2020-01-23 NOTE — Telephone Encounter (Signed)
Please see messages and advise. Thank you! 

## 2020-01-24 ENCOUNTER — Ambulatory Visit: Payer: Medicare Other | Attending: Internal Medicine

## 2020-01-24 DIAGNOSIS — Z23 Encounter for immunization: Secondary | ICD-10-CM

## 2020-01-24 MED ORDER — BENZONATATE 100 MG PO CAPS: 100.0000 mg | ORAL_CAPSULE | Freq: Two times a day (BID) | ORAL | 1 refills | Status: DC | PRN

## 2020-01-24 NOTE — Telephone Encounter (Signed)
Benzonatate 100mg  It was a fax from pharmacy.

## 2020-01-24 NOTE — Telephone Encounter (Signed)
Rx refilled.

## 2020-01-24 NOTE — Addendum Note (Signed)
Addended by: Overton Mam on: 01/24/2020 12:35 PM   Modules accepted: Orders

## 2020-01-24 NOTE — Telephone Encounter (Signed)
Sorry, I must be missing it but what med is she requesting a refill of?

## 2020-01-24 NOTE — Progress Notes (Signed)
   Covid-19 Vaccination Clinic  Name:  [name] [name]    MRN: [id number] DOB: [birth year]  01/24/2020  [name] was observed post Covid-19 immunization for 15 minutes without incident. She was provided with Vaccine Information Sheet and instruction to access the V-Safe system.   [name] was instructed to call 911 with any severe reactions post vaccine: Marland Kitchen Difficulty breathing  . Swelling of face and throat  . A fast heartbeat  . A bad rash all over body  . Dizziness and weakness   Immunizations Administered    Name Date Dose VIS Date Route   Pfizer COVID-19 Vaccine 01/24/2020  2:01 PM 0.3 mL 10/20/2019 Intramuscular   Manufacturer: [employer], [employer]   Lot: [id number]   NDC: [address]-0

## 2020-01-25 ENCOUNTER — Other Ambulatory Visit: Payer: Self-pay | Admitting: Family Medicine

## 2020-01-25 DIAGNOSIS — H1013 Acute atopic conjunctivitis, bilateral: Secondary | ICD-10-CM

## 2020-01-25 NOTE — Telephone Encounter (Signed)
Please call pts daughter to let her know the peer-to-peer review was done and per PT/OT eval done on 01/17/20, pt has shown improvement in her physical status and does not meet criteria for skilled nursing facility. It was recommended pt/family contact social worker or medicare to discuss other community resources. Referral placed to social work.

## 2020-01-25 NOTE — Addendum Note (Signed)
Addended by: Overton Mam on: 01/25/2020 10:14 AM   Modules accepted: Orders

## 2020-01-26 ENCOUNTER — Telehealth: Payer: Self-pay | Admitting: Cardiovascular Disease

## 2020-01-26 DIAGNOSIS — M1A9XX1 Chronic gout, unspecified, with tophus (tophi): Secondary | ICD-10-CM | POA: Diagnosis not present

## 2020-01-26 DIAGNOSIS — M48061 Spinal stenosis, lumbar region without neurogenic claudication: Secondary | ICD-10-CM | POA: Diagnosis not present

## 2020-01-26 DIAGNOSIS — M1990 Unspecified osteoarthritis, unspecified site: Secondary | ICD-10-CM | POA: Diagnosis not present

## 2020-01-26 DIAGNOSIS — G931 Anoxic brain damage, not elsewhere classified: Secondary | ICD-10-CM | POA: Diagnosis not present

## 2020-01-26 DIAGNOSIS — H538 Other visual disturbances: Secondary | ICD-10-CM | POA: Diagnosis not present

## 2020-01-26 DIAGNOSIS — K5790 Diverticulosis of intestine, part unspecified, without perforation or abscess without bleeding: Secondary | ICD-10-CM | POA: Diagnosis not present

## 2020-01-26 DIAGNOSIS — I4892 Unspecified atrial flutter: Secondary | ICD-10-CM | POA: Diagnosis not present

## 2020-01-26 DIAGNOSIS — D631 Anemia in chronic kidney disease: Secondary | ICD-10-CM | POA: Diagnosis not present

## 2020-01-26 DIAGNOSIS — G8929 Other chronic pain: Secondary | ICD-10-CM | POA: Diagnosis not present

## 2020-01-26 DIAGNOSIS — I420 Dilated cardiomyopathy: Secondary | ICD-10-CM | POA: Diagnosis not present

## 2020-01-26 DIAGNOSIS — E1151 Type 2 diabetes mellitus with diabetic peripheral angiopathy without gangrene: Secondary | ICD-10-CM | POA: Diagnosis not present

## 2020-01-26 DIAGNOSIS — I69398 Other sequelae of cerebral infarction: Secondary | ICD-10-CM | POA: Diagnosis not present

## 2020-01-26 DIAGNOSIS — I130 Hypertensive heart and chronic kidney disease with heart failure and stage 1 through stage 4 chronic kidney disease, or unspecified chronic kidney disease: Secondary | ICD-10-CM | POA: Diagnosis not present

## 2020-01-26 DIAGNOSIS — M0589 Other rheumatoid arthritis with rheumatoid factor of multiple sites: Secondary | ICD-10-CM | POA: Diagnosis not present

## 2020-01-26 DIAGNOSIS — N183 Chronic kidney disease, stage 3 unspecified: Secondary | ICD-10-CM | POA: Diagnosis not present

## 2020-01-26 DIAGNOSIS — M7501 Adhesive capsulitis of right shoulder: Secondary | ICD-10-CM | POA: Diagnosis not present

## 2020-01-26 DIAGNOSIS — Z8616 Personal history of COVID-19: Secondary | ICD-10-CM | POA: Diagnosis not present

## 2020-01-26 DIAGNOSIS — E1122 Type 2 diabetes mellitus with diabetic chronic kidney disease: Secondary | ICD-10-CM | POA: Diagnosis not present

## 2020-01-26 DIAGNOSIS — I5042 Chronic combined systolic (congestive) and diastolic (congestive) heart failure: Secondary | ICD-10-CM | POA: Diagnosis not present

## 2020-01-26 DIAGNOSIS — R1312 Dysphagia, oropharyngeal phase: Secondary | ICD-10-CM | POA: Diagnosis not present

## 2020-01-26 DIAGNOSIS — M797 Fibromyalgia: Secondary | ICD-10-CM | POA: Diagnosis not present

## 2020-01-26 DIAGNOSIS — I2510 Atherosclerotic heart disease of native coronary artery without angina pectoris: Secondary | ICD-10-CM | POA: Diagnosis not present

## 2020-01-26 NOTE — Telephone Encounter (Signed)
Spoke with patients daughter and EMS was out recently secondary to diaphoresis, nausea, and didn't feel well.  Called [phone] and explained patient needed follow up and was approved  Daughter will call [facility] and answer questions I was unable to answer

## 2020-01-26 NOTE — Telephone Encounter (Signed)
Patients daughter calling in because her mother has an appt with Dr. Duke Salvia on 01/30/20. She states that Dr. Duke Salvia wanted to see her mother in the office but [facility] provides the transportation and the daughter states that we will need to call them to verify that her mother has an appt on 01/30/20. She states she may be able to do a virtual appt if Dr. Duke Salvia approves of that, her mother was checked out by EMS a couple days ago so she believes she may be good to do virtual. Please advise.

## 2020-01-26 NOTE — Telephone Encounter (Signed)
Ok to see virtual if easier for her.

## 2020-01-27 ENCOUNTER — Other Ambulatory Visit: Payer: Self-pay | Admitting: Family Medicine

## 2020-01-28 IMAGING — MR MR HEAD W/O CM
12 of 13 series · 43 of 48 positions shown · non-contrast
Comparison: 01/09/2019 CT head.  06/25/2019 MRI head.

CLINICAL DATA: 88 y/o F; Focal neuro deficit, > 6 hrs, stroke
suspected, weakness.

EXAM:
MRI HEAD WITHOUT CONTRAST
TECHNIQUE: Multiplanar, multiecho pulse sequences of the brain and surrounding
structures were obtained without intravenous contrast.

[Series 5: DWI · axial · 3.0mm · 0.88mm/px · z∈[-88,+58]mm · 7 of 100 slices shown (1 of 4)]
[im 1/100]
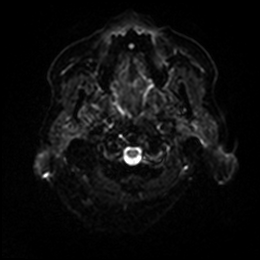
[im 17/100]
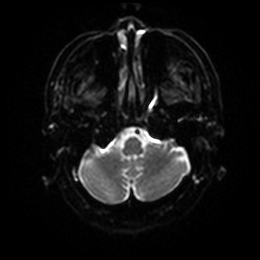
[im 34/100]
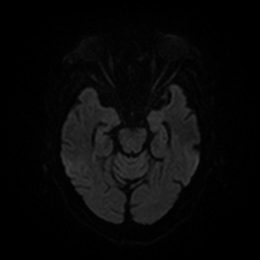
[im 50/100]
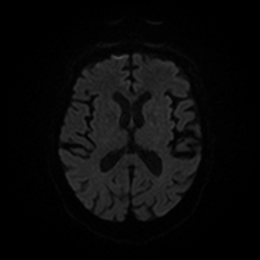
[im 67/100]
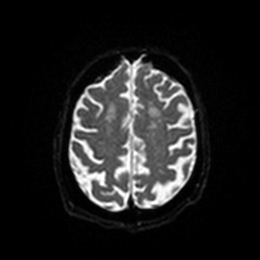
[im 83/100]
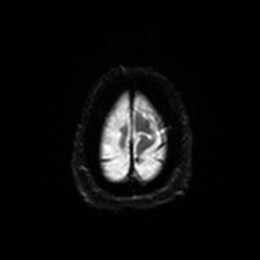
[im 100/100]
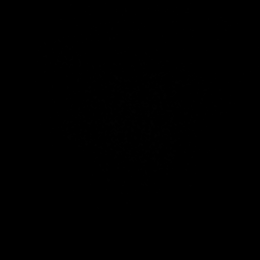

[Series 6: DWI · axial · 3.0mm · 0.88mm/px · z∈[-88,+52]mm · 3 of 48 slices shown (2 of 4)]
[im 1/48]
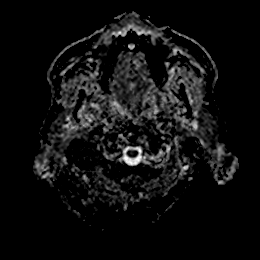
[im 24/48]
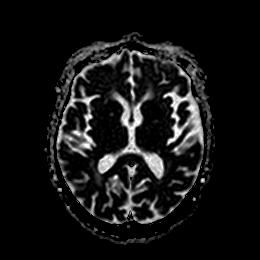
[im 48/48]
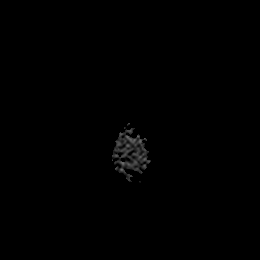

[Series 7: DWI · coronal · 4.0mm · 0.88mm/px · 5 of 64 slices shown (3 of 4)]
[im 1/64]
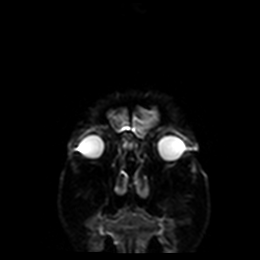
[im 16/64]
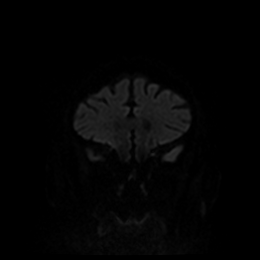
[im 32/64]
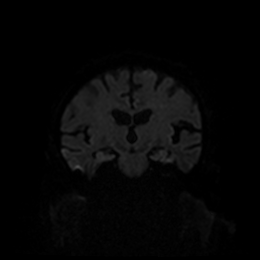
[im 48/64]
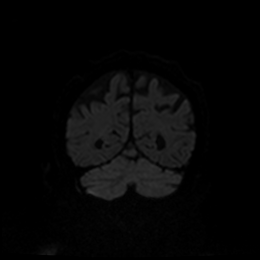
[im 64/64]
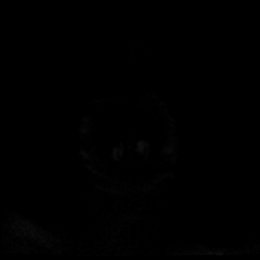

[Series 8: DWI · coronal · 4.0mm · 0.88mm/px · 2 of 32 slices shown (4 of 4)]
[im 1/32]
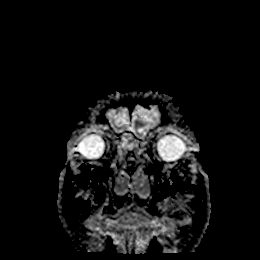
[im 32/32]
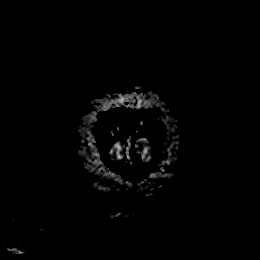

[Series 9: T1 · sagittal · 5.0mm · 0.75mm/px · 2 of 23 slices shown]
[im 1/23]
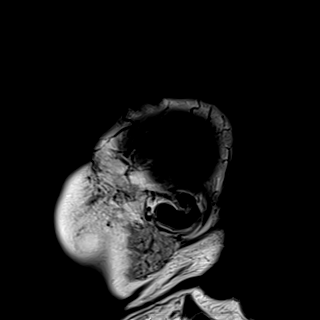
[im 23/23]
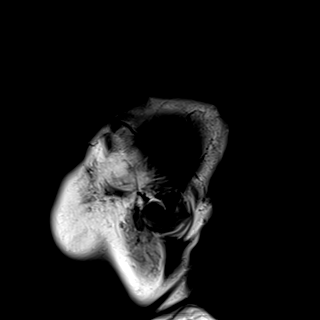

[Series 10: T2 · axial · 5.0mm · 0.72mm/px · z∈[-86,+57]mm · 2 of 25 slices shown (1 of 2)]
[im 1/25]
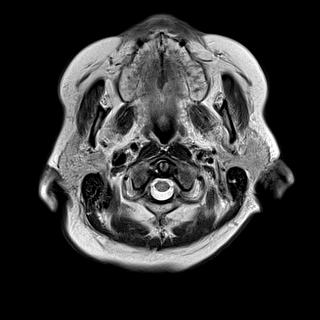
[im 25/25]
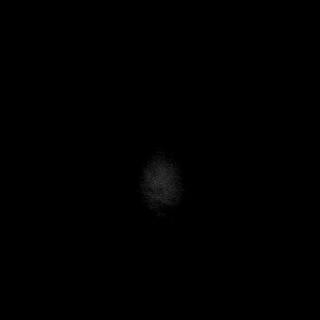

[Series 11: FLAIR · axial · 5.0mm · 0.45mm/px · z∈[-83,+59]mm · 2 of 25 slices shown]
[im 1/25]
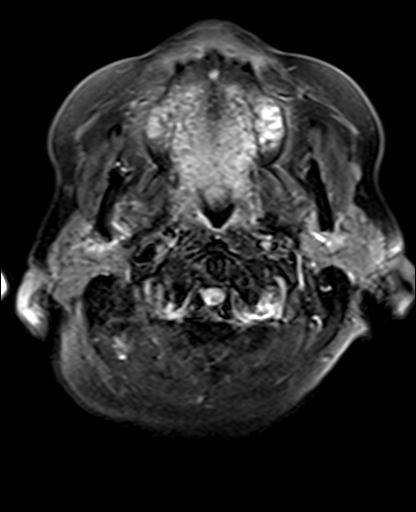
[im 25/25]
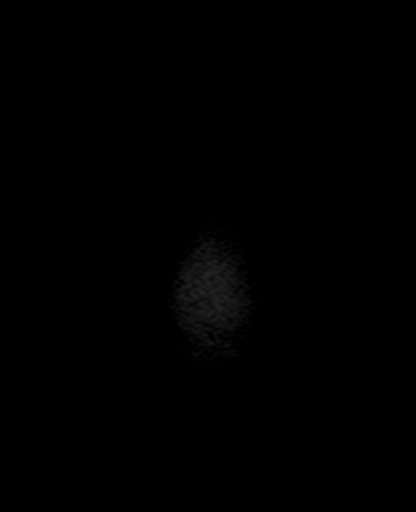

[Series 12: mag_images · axial · 3.0mm · 0.90mm/px · z∈[-100,+76]mm · 5 of 60 slices shown]
[im 1/60]
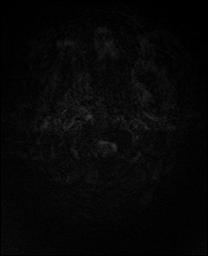
[im 15/60]
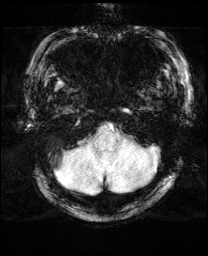
[im 30/60]
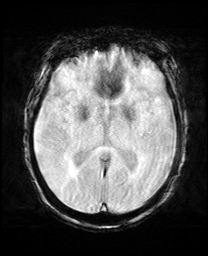
[im 45/60]
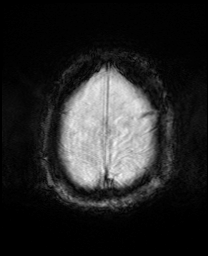
[im 60/60]
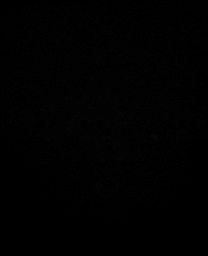

[Series 13: pha_images · axial · 3.0mm · 0.90mm/px · z∈[-100,+64]mm · 4 of 56 slices shown]
[im 1/56]
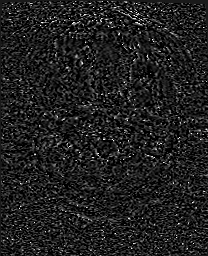
[im 19/56]
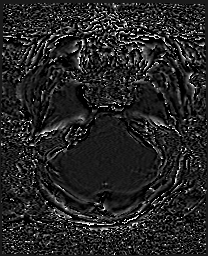
[im 37/56]
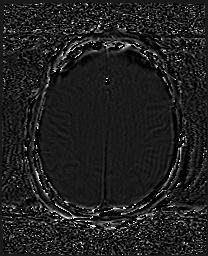
[im 56/56]
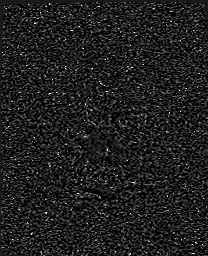

[Series 14: swi_images · axial · 3.0mm · 0.90mm/px · z∈[-100,+76]mm · 5 of 60 slices shown]
[im 1/60]
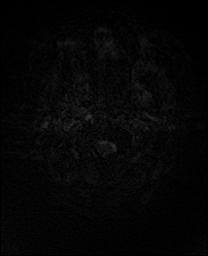
[im 15/60]
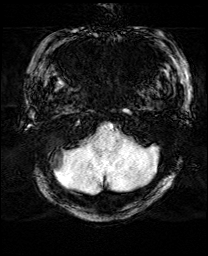
[im 30/60]
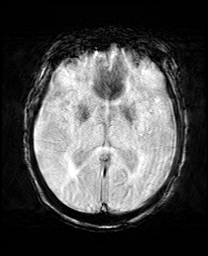
[im 45/60]
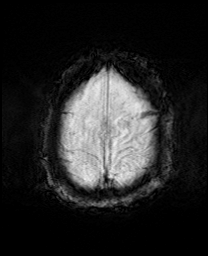
[im 60/60]
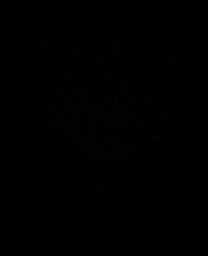

[Series 15: mip_images(sw) · axial · 24.0mm · 0.90mm/px · z∈[-89,+65]mm · 4 of 53 slices shown]
[im 1/53]
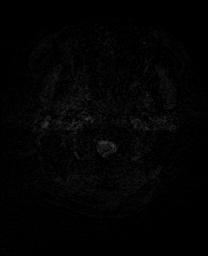
[im 18/53]
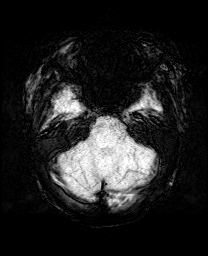
[im 35/53]
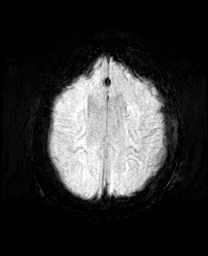
[im 53/53]
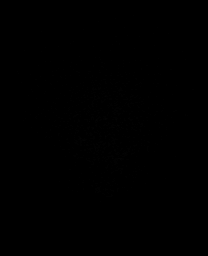

[Series 17: T2 · coronal · 5.0mm · 0.34mm/px · 2 of 29 slices shown (2 of 2)]
[im 1/29]
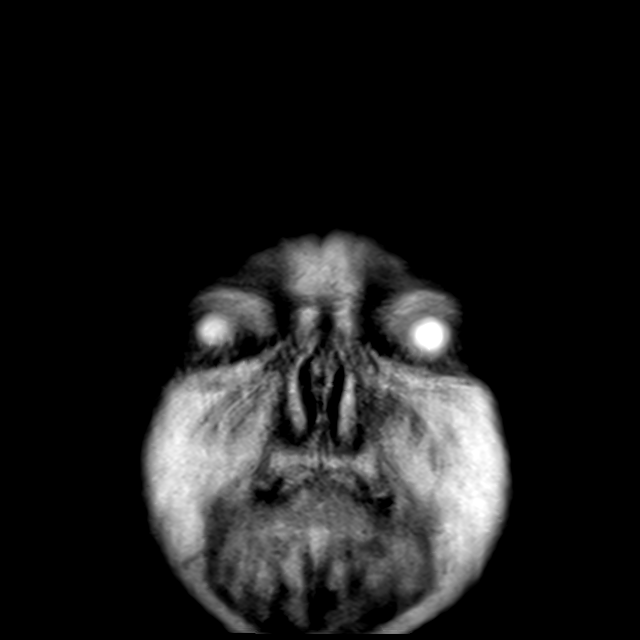
[im 29/29]
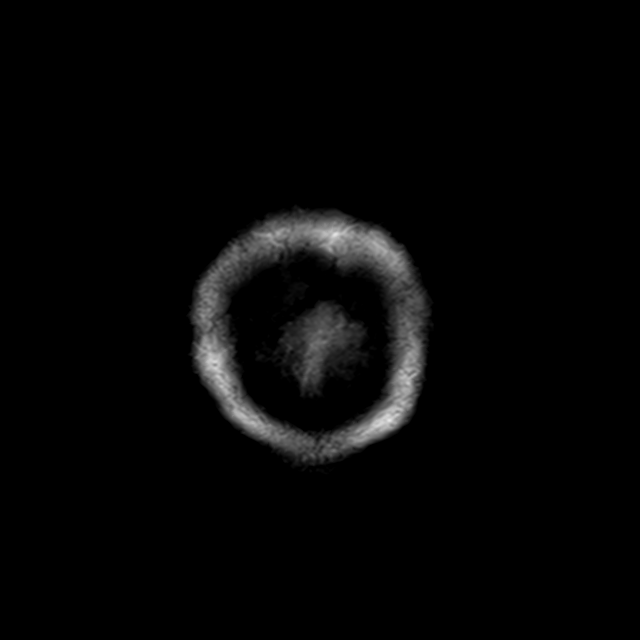

[43 of 48 positions shown; findings below may reference images not displayed]

FINDINGS: Brain: No acute infarction, hemorrhage, hydrocephalus, extra-axial
collection or mass lesion. Early confluent nonspecific T2 FLAIR
hyperintensities in subcortical and periventricular white matter are
compatible with moderate chronic microvascular ischemic changes.
Moderate volume loss of the brain. Very small chronic infarctions
within the right inferior cerebellar hemisphere and left hemi pons.

Vascular: Normal flow voids.

Skull and upper cervical spine: Normal marrow signal.

Sinuses/Orbits: Mild mucosal thickening of frontal and anterior
ethmoid air cells. No significant abnormal signal of mastoid air
cells. Bilateral intra-ocular lens replacement.

Other: None.
IMPRESSION: 1. No acute intracranial abnormality identified.
2. Moderate chronic microvascular ischemic changes and volume loss
of the brain.

## 2020-01-29 NOTE — Telephone Encounter (Signed)
Last OV 12/20/19 Last fill 01/01/20  #30/0

## 2020-01-29 NOTE — Telephone Encounter (Signed)
I spoke with Mrs. William's daughter and informed her message given below.

## 2020-01-30 ENCOUNTER — Encounter: Payer: Self-pay | Admitting: Cardiovascular Disease

## 2020-01-30 ENCOUNTER — Other Ambulatory Visit: Payer: Self-pay

## 2020-01-30 ENCOUNTER — Ambulatory Visit (INDEPENDENT_AMBULATORY_CARE_PROVIDER_SITE_OTHER): Payer: Medicare Other | Admitting: Cardiovascular Disease

## 2020-01-30 VITALS — BP 154/78 | HR 76 | Ht 64.0 in | Wt 180.0 lb

## 2020-01-30 DIAGNOSIS — M797 Fibromyalgia: Secondary | ICD-10-CM | POA: Diagnosis not present

## 2020-01-30 DIAGNOSIS — I2510 Atherosclerotic heart disease of native coronary artery without angina pectoris: Secondary | ICD-10-CM | POA: Diagnosis not present

## 2020-01-30 DIAGNOSIS — N183 Chronic kidney disease, stage 3 unspecified: Secondary | ICD-10-CM | POA: Diagnosis not present

## 2020-01-30 DIAGNOSIS — R1312 Dysphagia, oropharyngeal phase: Secondary | ICD-10-CM | POA: Diagnosis not present

## 2020-01-30 DIAGNOSIS — D631 Anemia in chronic kidney disease: Secondary | ICD-10-CM | POA: Diagnosis not present

## 2020-01-30 DIAGNOSIS — I420 Dilated cardiomyopathy: Secondary | ICD-10-CM | POA: Diagnosis not present

## 2020-01-30 DIAGNOSIS — M1A9XX1 Chronic gout, unspecified, with tophus (tophi): Secondary | ICD-10-CM | POA: Diagnosis not present

## 2020-01-30 DIAGNOSIS — H538 Other visual disturbances: Secondary | ICD-10-CM | POA: Diagnosis not present

## 2020-01-30 DIAGNOSIS — M1990 Unspecified osteoarthritis, unspecified site: Secondary | ICD-10-CM | POA: Diagnosis not present

## 2020-01-30 DIAGNOSIS — G931 Anoxic brain damage, not elsewhere classified: Secondary | ICD-10-CM | POA: Diagnosis not present

## 2020-01-30 DIAGNOSIS — M48061 Spinal stenosis, lumbar region without neurogenic claudication: Secondary | ICD-10-CM | POA: Diagnosis not present

## 2020-01-30 DIAGNOSIS — E1159 Type 2 diabetes mellitus with other circulatory complications: Secondary | ICD-10-CM | POA: Diagnosis not present

## 2020-01-30 DIAGNOSIS — K5790 Diverticulosis of intestine, part unspecified, without perforation or abscess without bleeding: Secondary | ICD-10-CM | POA: Diagnosis not present

## 2020-01-30 DIAGNOSIS — M7501 Adhesive capsulitis of right shoulder: Secondary | ICD-10-CM | POA: Diagnosis not present

## 2020-01-30 DIAGNOSIS — I4892 Unspecified atrial flutter: Secondary | ICD-10-CM | POA: Diagnosis not present

## 2020-01-30 DIAGNOSIS — G8929 Other chronic pain: Secondary | ICD-10-CM | POA: Diagnosis not present

## 2020-01-30 DIAGNOSIS — M0589 Other rheumatoid arthritis with rheumatoid factor of multiple sites: Secondary | ICD-10-CM | POA: Diagnosis not present

## 2020-01-30 DIAGNOSIS — I152 Hypertension secondary to endocrine disorders: Secondary | ICD-10-CM

## 2020-01-30 DIAGNOSIS — I130 Hypertensive heart and chronic kidney disease with heart failure and stage 1 through stage 4 chronic kidney disease, or unspecified chronic kidney disease: Secondary | ICD-10-CM | POA: Diagnosis not present

## 2020-01-30 DIAGNOSIS — I10 Essential (primary) hypertension: Secondary | ICD-10-CM | POA: Diagnosis not present

## 2020-01-30 DIAGNOSIS — I5042 Chronic combined systolic (congestive) and diastolic (congestive) heart failure: Secondary | ICD-10-CM

## 2020-01-30 DIAGNOSIS — E1151 Type 2 diabetes mellitus with diabetic peripheral angiopathy without gangrene: Secondary | ICD-10-CM | POA: Diagnosis not present

## 2020-01-30 DIAGNOSIS — Z8616 Personal history of COVID-19: Secondary | ICD-10-CM | POA: Diagnosis not present

## 2020-01-30 DIAGNOSIS — E1122 Type 2 diabetes mellitus with diabetic chronic kidney disease: Secondary | ICD-10-CM | POA: Diagnosis not present

## 2020-01-30 DIAGNOSIS — I69398 Other sequelae of cerebral infarction: Secondary | ICD-10-CM | POA: Diagnosis not present

## 2020-01-30 MED ORDER — HYDRALAZINE HCL 25 MG PO TABS: ORAL_TABLET | ORAL | 3 refills | Status: DC

## 2020-01-30 NOTE — Patient Instructions (Signed)
Medication Instructions:  INCREASE YOUR HYDRALAZINE TO 1 AND 1/2 TABLETS THREE TIMES A DAY   *If you need a refill on your cardiac medications before your next appointment, please call your pharmacy*  Lab Work: NONE   Testing/Procedures: NONE   Follow-Up: At [facility], you and your health needs are our priority.  As part of our continuing mission to provide you with exceptional heart care, we have created designated Provider Care Teams.  These Care Teams include your primary Cardiologist (physician) and Advanced Practice Providers (APPs -  Physician Assistants and Nurse Practitioners) who all work together to provide you with the care you need, when you need it.  We recommend signing up for the patient portal called "MyChart".  Sign up information is provided on this After Visit Summary.  MyChart is used to connect with patients for Virtual Visits (Telemedicine).  Patients are able to view lab/test results, encounter notes, upcoming appointments, etc.  Non-urgent messages can be sent to your provider as well.   To learn more about what you can do with MyChart, go to [email or web address].    Your next appointment:   6 week(s)  The format for your next appointment:   Virtual Visit   Provider:   You may see Chilton Si, MD or one of the following Advanced Practice Providers on your designated Care Team:    Corine Shelter, PA-C  [place], [place]  Edd Fabian, [place]

## 2020-01-30 NOTE — Progress Notes (Signed)
6   Cardiology Office Note   Date:  01/30/2020   ID:  [name] [name], [name] [name] [birth year], MRN [id number]  PCP:  Ronnald Nian, DO  Cardiologist:   Skeet Latch, MD   No chief complaint on file.   History of Present Illness: [name] [name] is a 86 y.o. female with chronic systolic and diastolic heart failure, hypertension, Rheumatoid Arthritis, diabetes type 2, CAD s/p MI, and CVA who presents for follow up. She was initially seen in 2016 for management of chronic diastolic heart failure. [name] [name] was admitted to the [facility] Medicine service 11/1-11/3 with chest pain. At that time she reported constant chest pain that began in the setting of a cold. CT scan of the chest was concerning for edema. She received IV lasix and diuresed with improvement in her breathing. She was also started on oral lasix. At her initial appointment she was feeling well. She was admitted 05/2018 with weakness, chest discomfort, and shortness of breath. She was found to have pneumonia and heart failure. BNP was also elevated to 2000 and troponin was mildly elevated to 0.06. Echocardiogram 05/29/2018 revealed LVEF 20 to 25% and moderate pulmonary hypertension. She underwent left and right heart catheterization and had no obstructive CAD.  [name] [name] was admitted 06/2018 with gallstone pancreatitis. Cardiology was consulted and she was felt to be at acceptable risk for surgery. She underwent laparoscopic cholecystectomy and recovered quite well.She was discharged on Lasix 40 mg but due to confusion was only taking 20 mg. After increasing dose to 40 mg her weight returned to her baseline of 170 to 171 pounds. Losartan was switched to [facility]. She had a follow up echo 08/2018 that revealed LVEF 60 to 65% with grade 1 diastolic dysfunction. There is mild mitral stenosis with a mild to moderately calcified annulus. She was admitted 12/2018 for hand surgery and received cardiology clearance for both  hand and cervical surgery.   She was later admitted 01/2019 with encephalopathy and fever of unclear etiology.  Troponin was elevated to 0.04 and flat.  She had no chest pain.  Repeat echo 05/2019 revealed that her LVEF was again reduced to 30 to 35%.    Two weeks ago [name] [name] had an episode of abdominal pain.  She needed to have a bowel movement but has difficulty with mobility and couldn't get there quickly.  She had to hold it in and was very uncomfortable.  She then got very lightheaded and nearly passed out.  She called EMS where EKG and vital signs were stable.  Since that time she has felt well.  She denies any exertional chest pain and her breathing has been stable.  At her last appointment hydralazine was reduced due to hypotension.  In December she had COVID-19.  Lately she notes that her blood pressure has been running high.  Most recently was 187/90.  She is doing physical therapy twice weekly and is making good progress.  She thinks that this is helping her to get stronger.   Past Medical History:  Diagnosis Date  . Allergic rhinitis due to pollen 04/27/2007  . Arthritis    "in q joint" (08/07/2013)  . CHF (congestive heart failure) ([place])   . Coronary atherosclerosis of native coronary artery   . Cough   . Edema 05/03/2013  . Exertional shortness of breath    "sometimes" (08/07/2013)  . First degree atrioventricular block   . Gout, unspecified 04/19/2013  . Hyperlipidemia 04/27/2007  . Hypertension   .  Midline low back pain without sciatica 06/27/2014  . Muscle spasm   . Muscle weakness (generalized)   . Onychia and paronychia of toe   . Osteoarthrosis, unspecified whether generalized or localized, unspecified site 04/27/2007  . Other fall   . Other malaise and fatigue   . Pneumonia 05/2018  . Prepatellar bursitis   . Pulmonary embolism ([place]) 07/2018  . Rheumatic nodule ([place]) 06/28/2017  . Seizures ([place])   . Stroke ([place]) 01/17/2014  . Type II diabetes mellitus ([place])     "fasting 90-110s" (08/07/2013)  . Unspecified vitamin D deficiency     Past Surgical History:  Procedure Laterality Date  . ABDOMINAL HYSTERECTOMY  04/1980  . APPENDECTOMY  1960  . CARDIAC CATHETERIZATION  2003  . CATARACT EXTRACTION W/ INTRAOCULAR LENS  IMPLANT, BILATERAL Bilateral ~ 2012  . CHOLECYSTECTOMY N/A 06/26/2018   Procedure: LAPAROSCOPIC CHOLECYSTECTOMY POSSIBLE INTRAOPERATIVE CHOLANGIOGRAM;  Surgeon: Coralie Keens, MD;  Location: [facility];  Service: General;  Laterality: N/A;  . JOINT REPLACEMENT    . REPLACEMENT TOTAL KNEE Right 09/2005  . RIGHT/LEFT HEART CATH AND CORONARY ANGIOGRAPHY N/A 06/01/2018   Procedure: RIGHT/LEFT HEART CATH AND CORONARY ANGIOGRAPHY;  Surgeon: Jettie Booze, MD;  Location: [facility];  Service: Cardiovascular;  Laterality: N/A;  . SHOULDER OPEN ROTATOR CUFF REPAIR Right 07/1999  . TONSILLECTOMY  08/1974  . TOTAL KNEE ARTHROPLASTY Left 08/07/2013   Procedure: TOTAL KNEE ARTHROPLASTY- LEFT;  Surgeon: Vickey Huger, MD;  Location: [facility];  Service: Orthopedics;  Laterality: Left;  . TRANSTHORACIC ECHOCARDIOGRAM  2003   EF 55-65%; mild concentric LVH     Current Outpatient Medications  Medication Sig Dispense Refill  . acetaminophen (TYLENOL) 500 MG tablet Take 1,000 mg by mouth every 8 (eight) hours as needed for mild pain.     Marland Kitchen albuterol (PROVENTIL HFA;VENTOLIN HFA) 108 (90 Base) MCG/ACT inhaler Inhale 1-2 puffs into the lungs every 6 (six) hours as needed for wheezing or shortness of breath. 1 Inhaler 0  . allopurinol (ZYLOPRIM) 100 MG tablet TAKE 1 TABLET BY MOUTH EVERY DAY AT BEDTIME (Patient taking differently: Take 100 mg by mouth at bedtime. ) 90 tablet 3  . benzonatate (TESSALON) 100 MG capsule Take 1 capsule (100 mg total) by mouth 2 (two) times daily as needed for cough. 30 capsule 1  . Blood Glucose Monitoring Suppl (ACCU-CHEK AVIVA PLUS) w/Device KIT Use to check blood sugars 1-2 times daily. 1 kit 0  . carvedilol (COREG) 25 MG  tablet TAKE 1 TABLET BY MOUTH TWICE DAILY (Patient taking differently: Take 25 mg by mouth 2 (two) times daily with a meal. ) 180 tablet 3  . D3-1000 25 MCG (1000 UT) capsule Take 1 capsule (1,000 Units total) by mouth daily. 90 capsule 3  . docusate sodium (COLACE) 100 MG capsule Take 1 capsule (100 mg total) by mouth daily as needed for mild constipation. 10 capsule 0  . ENSURE (ENSURE) Take 237 mLs by mouth every evening. 237 mL 12  . ENTRESTO 49-51 MG TAKE 1 TABLET BY MOUTH EVERY DAY (Patient taking differently: Take 1 tablet by mouth daily. ) 90 tablet 3  . furosemide (LASIX) 40 MG tablet Take 1 tablet (40 mg total) by mouth 2 (two) times daily.    Marland Kitchen gabapentin (NEURONTIN) 100 MG capsule TAKE 1 CAPSULE BY MOUTH THREE TIMES DAILY (Patient taking differently: Take 100 mg by mouth 3 (three) times daily. ) 270 capsule 3  . glucose blood (ACCU-CHEK AVIVA PLUS) test strip  Use to test blood sugars 1-2 times daily. 100 each 12  . hydrALAZINE (APRESOLINE) 25 MG tablet TAKE 1 AND 1/2 TABLETS BY MOUTH THREE TIMES A DAY 405 tablet 3  . hydroxychloroquine (PLAQUENIL) 200 MG tablet Take 1 tablet (200 mg total) by mouth 2 (two) times daily. 60 tablet 0  . isosorbide mononitrate (IMDUR) 30 MG 24 hr tablet TAKE 1 TABLET BY MOUTH EVERY DAY (Patient taking differently: Take 30 mg by mouth daily. ) 90 tablet 3  . Lancets (ACCU-CHEK SOFT TOUCH) lancets Use to check blood sugars 1-2 times daily. 100 each 12  . Olopatadine HCl 0.2 % SOLN INSTILL 1 DROP IN BOTH EYES DAILY 2.5 mL 0  . Omega 3-6-9 Fatty Acids (OMEGA 3-6-9 COMPLEX) CAPS Take 1 capsule by mouth daily. Omega 3 Black    . potassium chloride SA (K-DUR) 20 MEQ tablet Take 20 mEq by mouth See admin instructions. Monday and Thursday    . predniSONE (DELTASONE) 5 MG tablet Take 1.5 tablets (7.5 mg total) by mouth daily with breakfast. 135 tablet 3  . Probiotic CAPS Take 1 capsule by mouth daily. 30 capsule 0  . sitaGLIPtin (JANUVIA) 25 MG tablet TAKE 1/2 TABLET  BY MOUTH(12,5 MG) EVERY DAY 45 tablet 3  . traMADol (ULTRAM) 50 MG tablet TAKE 1 TO 2 TABLETS BY MOUTH EVERY 6 HOURS AS NEEDED FOR PAIN 120 tablet 0  . XARELTO 20 MG TABS tablet TAKE 1 TABLET(20 MG) BY MOUTH DAILY 30 tablet 0   No current facility-administered medications for this visit.    Allergies:   Clonidine derivatives, Fish allergy, Shellfish allergy, Doxycycline, Indomethacin, Lyrica [pregabalin], Methyldopa, Morphine and related, Orange fruit [citrus], Strawberry (diagnostic), Cetirizine hcl, Codeine, Levaquin [levofloxacin in d5w], and Tomato    Social History:  The patient  reports that she has never smoked. She has never used smokeless tobacco. She reports that she does not drink alcohol or use drugs.   Family History:  The patient's family history includes Heart attack in her father.    ROS:  Please see the history of present illness.   Otherwise, review of systems are positive for allergies.   All other systems are reviewed and negative.    PHYSICAL EXAM: VS:  BP (!) 154/78   Pulse 76   Ht 5' 4"  (1.626 m)   Wt 180 lb (81.6 kg)   SpO2 99%   BMI 30.90 kg/m  , BMI Body mass index is 30.9 kg/m. GENERAL:  Well appearing. No acute distress HEENT: Pupils equal round and reactive, fundi not visualized, oral mucosa unremarkable NECK:  No jugular venous distention, waveform within normal limits, carotid upstroke brisk and symmetric, no bruits LUNGS:  Clear to auscultation bilaterally HEART:  RRR.  PMI not displaced or sustained,S1 and S2 within normal limits, no S3, no S4, no clicks, no rubs, no murmurs ABD:  Flat, positive bowel sounds normal in frequency in pitch, no bruits, no rebound, no guarding, no midline pulsatile mass, no hepatomegaly, no splenomegaly EXT:  2 plus pulses throughout, trace edema, no cyanosis no clubbing SKIN:  No rashes no nodules NEURO:  Cranial nerves II through XII grossly intact, motor grossly intact throughout PSYCH:  Cognitively intact, oriented  to person place and time   EKG:  EKG is not ordered today. 12/14/19 (EMS): Sinus rhythm.  Rate 83 bpm.  LAFB.  LVH.  Lateral W wave abnormalities  LHC 0/10/27:  LV end diastolic pressure is moderately elevated.  There is no aortic valve stenosis.  Hemodynamic findings consistent with moderate pulmonary hypertension.  CO 5.68 L/min; CI 2.97; Ao sat 99%; PA sat 65%; mean PA 42 mm Hg; mean PCWP 28 mm Hg  Nonobstructive CAD.   Nonischemic cardiomyopathy with mild to moderate CAD.   Echo 05/29/18: Study Conclusions  - Left ventricle: The cavity size was normal. Wall thickness was   increased in a pattern of moderate LVH. Systolic function was   severely reduced. The estimated ejection fraction was in the   range of 20% to 25%. Although no diagnostic regional wall motion   abnormality was identified, this possibility cannot be completely   excluded on the basis of this study. - Aortic valve: Mildly calcified annulus. Trileaflet; normal   thickness, moderately calcified leaflets. There was mild   regurgitation. - Mitral valve: Mildly calcified annulus. Mildly calcified leaflets   . There was mild to moderate regurgitation. - Left atrium: The atrium was moderately to severely dilated. - Right ventricle: The cavity size was mildly dilated. Wall   thickness was normal. Systolic function was mildly reduced. - Right atrium: The atrium was moderately dilated. - Atrial septum: There was a small patent foramen ovale. There was   a left-to-right shunt. - Tricuspid valve: There was moderate regurgitation. - Pulmonary arteries: Systolic pressure was moderately increased.   PA peak pressure: 47 mm Hg (S). - Pericardium, extracardiac: A trivial pericardial effusion was   identified.  Echo 01/18/14: Study Conclusions  - Left ventricle: The cavity size was normal. Wall thickness was increased in a pattern of moderate to severe LVH. There was moderate focal basal hypertrophy of the  septum. Systolic function was normal. The estimated ejection fraction was in the range of 60% to 65%. Wall motion was normal; there were no regional wall motion abnormalities. Doppler parameters are consistent with abnormal left ventricular relaxation (grade 1 diastolic dysfunction). - Left atrium: The atrium was moderately dilated. - Pulmonary arteries: Systolic pressure was mildly increased.  Carotid ultrasound 01/18/14: Summary: Bilateral: intimal wall thickening CCA. 1-39% ICA stenosis. Vertebral artery flow is antegrade.   Recent Labs: 03/29/2019: TSH 1.521 10/29/2019: ALT 11 12/12/2019: B Natriuretic Peptide 196.6 12/20/2019: BUN 24; Creatinine, Ser 0.85; Hemoglobin 9.8; Platelets 279.0; Potassium 3.9; Pro B Natriuretic peptide (BNP) 460.0; Sodium 139    Lipid Panel    Component Value Date/Time   CHOL 202 (H) 06/25/2018 0607   CHOL 185 03/04/2016 1620   TRIG 67 06/25/2018 0607   HDL 63 06/25/2018 0607   HDL 73 03/04/2016 1620   CHOLHDL 3.2 06/25/2018 0607   VLDL 13 06/25/2018 0607   LDLCALC 126 (H) 06/25/2018 0607   LDLCALC 89 03/04/2016 1620      Wt Readings from Last 3 Encounters:  01/30/20 180 lb (81.6 kg)  12/12/19 159 lb (72.1 kg)  10/30/19 180 lb (81.6 kg)      ASSESSMENT AND PLAN:  # Chronic systolic and diastolic heart failure: # Hypertension:  Systolic function normalized and then was 30-35% on echo 05/2019. She is euvolemic and has no heart failure symptoms.Continue carvedilol, hydralazine, isosorbide mononitrate, lasix,and Entresto.  No ICD given age.  Her blood pressure is now running high.  Last time we reduced hydralazine because her blood pressure was low.  We will split the difference and do hydralazine 37.5 mg 3 times a day.  If her blood pressure continues to run high we will consider adding spironolactone at follow-up.  # Near syncope:  Episode occurred in the setting of abdominal pain and defecation.  This  seems to have been an  episode of vasovagal syncope.  She has no recurrent symptoms and is otherwise doing well.  No further evaluation at this time.  # Chest pain:Resolved. No CAD on cath.  # CAD s/p MI:Ms. Penning reportedly had an MI in the 32s. She had minimal, non-obstructive disease on cath 05/2018.  Continue carvedilol.  # PE: Ms. Antrobus had a PE 07/2018 soon after having pneumonia. She is on Xarelto.  Given that she remains inactive and requires a Hoyer lift to move, continue indefinitely.   Current medicines are reviewed at length with the patient today.  The patient does not have concerns regarding medicines.  The following changes have been made:  Reduce hydralazine  Labs/ tests ordered today include:   No orders of the defined types were placed in this encounter.    Disposition:   FU with Darrelle Barrell C. Oval Linsey, MD, [facility] in 6 weeks virtually   Signed, Berry Gallacher C. Oval Linsey, MD, [facility]  01/30/2020 3:42 PM    [facility]

## 2020-02-01 DIAGNOSIS — D631 Anemia in chronic kidney disease: Secondary | ICD-10-CM | POA: Diagnosis not present

## 2020-02-01 DIAGNOSIS — G931 Anoxic brain damage, not elsewhere classified: Secondary | ICD-10-CM | POA: Diagnosis not present

## 2020-02-01 DIAGNOSIS — N183 Chronic kidney disease, stage 3 unspecified: Secondary | ICD-10-CM | POA: Diagnosis not present

## 2020-02-01 DIAGNOSIS — M48061 Spinal stenosis, lumbar region without neurogenic claudication: Secondary | ICD-10-CM | POA: Diagnosis not present

## 2020-02-01 DIAGNOSIS — M797 Fibromyalgia: Secondary | ICD-10-CM | POA: Diagnosis not present

## 2020-02-01 DIAGNOSIS — K5790 Diverticulosis of intestine, part unspecified, without perforation or abscess without bleeding: Secondary | ICD-10-CM | POA: Diagnosis not present

## 2020-02-01 DIAGNOSIS — I5042 Chronic combined systolic (congestive) and diastolic (congestive) heart failure: Secondary | ICD-10-CM | POA: Diagnosis not present

## 2020-02-01 DIAGNOSIS — I130 Hypertensive heart and chronic kidney disease with heart failure and stage 1 through stage 4 chronic kidney disease, or unspecified chronic kidney disease: Secondary | ICD-10-CM | POA: Diagnosis not present

## 2020-02-01 DIAGNOSIS — Z8616 Personal history of COVID-19: Secondary | ICD-10-CM | POA: Diagnosis not present

## 2020-02-01 DIAGNOSIS — E1122 Type 2 diabetes mellitus with diabetic chronic kidney disease: Secondary | ICD-10-CM | POA: Diagnosis not present

## 2020-02-01 DIAGNOSIS — G8929 Other chronic pain: Secondary | ICD-10-CM | POA: Diagnosis not present

## 2020-02-01 DIAGNOSIS — I420 Dilated cardiomyopathy: Secondary | ICD-10-CM | POA: Diagnosis not present

## 2020-02-01 DIAGNOSIS — M1990 Unspecified osteoarthritis, unspecified site: Secondary | ICD-10-CM | POA: Diagnosis not present

## 2020-02-01 DIAGNOSIS — R1312 Dysphagia, oropharyngeal phase: Secondary | ICD-10-CM | POA: Diagnosis not present

## 2020-02-01 DIAGNOSIS — E1151 Type 2 diabetes mellitus with diabetic peripheral angiopathy without gangrene: Secondary | ICD-10-CM | POA: Diagnosis not present

## 2020-02-01 DIAGNOSIS — M7501 Adhesive capsulitis of right shoulder: Secondary | ICD-10-CM | POA: Diagnosis not present

## 2020-02-01 DIAGNOSIS — M0589 Other rheumatoid arthritis with rheumatoid factor of multiple sites: Secondary | ICD-10-CM | POA: Diagnosis not present

## 2020-02-01 DIAGNOSIS — I69398 Other sequelae of cerebral infarction: Secondary | ICD-10-CM | POA: Diagnosis not present

## 2020-02-01 DIAGNOSIS — M1A9XX1 Chronic gout, unspecified, with tophus (tophi): Secondary | ICD-10-CM | POA: Diagnosis not present

## 2020-02-01 DIAGNOSIS — H538 Other visual disturbances: Secondary | ICD-10-CM | POA: Diagnosis not present

## 2020-02-01 DIAGNOSIS — I2510 Atherosclerotic heart disease of native coronary artery without angina pectoris: Secondary | ICD-10-CM | POA: Diagnosis not present

## 2020-02-01 DIAGNOSIS — I4892 Unspecified atrial flutter: Secondary | ICD-10-CM | POA: Diagnosis not present

## 2020-02-02 ENCOUNTER — Encounter: Payer: Self-pay | Admitting: Family Medicine

## 2020-02-03 ENCOUNTER — Encounter: Payer: Self-pay | Admitting: Family Medicine

## 2020-02-05 ENCOUNTER — Encounter: Payer: Self-pay | Admitting: Family Medicine

## 2020-02-05 NOTE — Telephone Encounter (Signed)
Patient daughter is calling and wanted to see if the paperwork was ready to pick. CB is [phone]

## 2020-02-05 NOTE — Telephone Encounter (Signed)
I spoke with pt's daughter and informed her that I did receive FL2 form today and to also let her know that Dr. Salena Saner is out of office until tomorrow.  Pt's daughter explained that they will need form by tomorrow as pt will be admitted to facility tomorrow.  I suggested that I would ask to see if one of our other providers (doc of the day) would sign off form but if not I will ask Dr. Salena Saner to address form first thing in the morning.  Please advise

## 2020-02-05 NOTE — Telephone Encounter (Signed)
Called and made pt's daughter aware.  I will converse with Dr. Salena Saner tomorrow morning upon arrival.

## 2020-02-06 DIAGNOSIS — R54 Age-related physical debility: Secondary | ICD-10-CM | POA: Diagnosis not present

## 2020-02-06 DIAGNOSIS — I10 Essential (primary) hypertension: Secondary | ICD-10-CM | POA: Diagnosis not present

## 2020-02-06 DIAGNOSIS — I5042 Chronic combined systolic (congestive) and diastolic (congestive) heart failure: Secondary | ICD-10-CM | POA: Diagnosis not present

## 2020-02-06 DIAGNOSIS — M05649 Rheumatoid arthritis of unspecified hand with involvement of other organs and systems: Secondary | ICD-10-CM | POA: Diagnosis not present

## 2020-02-06 DIAGNOSIS — I4892 Unspecified atrial flutter: Secondary | ICD-10-CM | POA: Diagnosis not present

## 2020-02-06 DIAGNOSIS — G9341 Metabolic encephalopathy: Secondary | ICD-10-CM | POA: Diagnosis not present

## 2020-02-06 DIAGNOSIS — L88 Pyoderma gangrenosum: Secondary | ICD-10-CM | POA: Diagnosis not present

## 2020-02-06 DIAGNOSIS — R278 Other lack of coordination: Secondary | ICD-10-CM | POA: Diagnosis not present

## 2020-02-06 DIAGNOSIS — M797 Fibromyalgia: Secondary | ICD-10-CM | POA: Diagnosis not present

## 2020-02-06 DIAGNOSIS — I6350 Cerebral infarction due to unspecified occlusion or stenosis of unspecified cerebral artery: Secondary | ICD-10-CM | POA: Diagnosis not present

## 2020-02-06 DIAGNOSIS — L97212 Non-pressure chronic ulcer of right calf with fat layer exposed: Secondary | ICD-10-CM | POA: Diagnosis not present

## 2020-02-06 DIAGNOSIS — I5020 Unspecified systolic (congestive) heart failure: Secondary | ICD-10-CM | POA: Diagnosis not present

## 2020-02-06 DIAGNOSIS — M6281 Muscle weakness (generalized): Secondary | ICD-10-CM | POA: Diagnosis not present

## 2020-02-06 DIAGNOSIS — I6930 Unspecified sequelae of cerebral infarction: Secondary | ICD-10-CM | POA: Diagnosis not present

## 2020-02-06 DIAGNOSIS — G4700 Insomnia, unspecified: Secondary | ICD-10-CM | POA: Diagnosis not present

## 2020-02-06 DIAGNOSIS — M259 Joint disorder, unspecified: Secondary | ICD-10-CM | POA: Diagnosis not present

## 2020-02-06 DIAGNOSIS — Z8673 Personal history of transient ischemic attack (TIA), and cerebral infarction without residual deficits: Secondary | ICD-10-CM | POA: Diagnosis not present

## 2020-02-06 DIAGNOSIS — M4800 Spinal stenosis, site unspecified: Secondary | ICD-10-CM | POA: Diagnosis not present

## 2020-02-06 NOTE — Telephone Encounter (Signed)
[facility] is calling back and stated that another FL2 was faxed and needed to be completed and faxed back.  Also the facility needs a copy of patients allergies and a list of new medications. Facility stated that the [facility] they received was expired. Fax number is [phone]. CB is [phone]  Ext [phone]

## 2020-02-06 NOTE — Telephone Encounter (Signed)
Forms have been faxed and also a message sent to pt's daughter to inform her that forms are also ready for pick up.

## 2020-02-06 NOTE — Telephone Encounter (Signed)
FL2 form completed with attached medication list and problem/diagnosis list

## 2020-02-07 DIAGNOSIS — I5042 Chronic combined systolic (congestive) and diastolic (congestive) heart failure: Secondary | ICD-10-CM | POA: Diagnosis not present

## 2020-02-07 DIAGNOSIS — R54 Age-related physical debility: Secondary | ICD-10-CM | POA: Diagnosis not present

## 2020-02-07 DIAGNOSIS — M6281 Muscle weakness (generalized): Secondary | ICD-10-CM | POA: Diagnosis not present

## 2020-02-07 DIAGNOSIS — I6350 Cerebral infarction due to unspecified occlusion or stenosis of unspecified cerebral artery: Secondary | ICD-10-CM | POA: Diagnosis not present

## 2020-02-07 DIAGNOSIS — I4892 Unspecified atrial flutter: Secondary | ICD-10-CM | POA: Diagnosis not present

## 2020-02-09 DIAGNOSIS — I4892 Unspecified atrial flutter: Secondary | ICD-10-CM | POA: Diagnosis not present

## 2020-02-09 DIAGNOSIS — M6281 Muscle weakness (generalized): Secondary | ICD-10-CM | POA: Diagnosis not present

## 2020-02-09 DIAGNOSIS — R54 Age-related physical debility: Secondary | ICD-10-CM | POA: Diagnosis not present

## 2020-02-09 DIAGNOSIS — I5042 Chronic combined systolic (congestive) and diastolic (congestive) heart failure: Secondary | ICD-10-CM | POA: Diagnosis not present

## 2020-02-09 DIAGNOSIS — I6350 Cerebral infarction due to unspecified occlusion or stenosis of unspecified cerebral artery: Secondary | ICD-10-CM | POA: Diagnosis not present

## 2020-02-12 DIAGNOSIS — I4892 Unspecified atrial flutter: Secondary | ICD-10-CM | POA: Diagnosis not present

## 2020-02-12 DIAGNOSIS — I6350 Cerebral infarction due to unspecified occlusion or stenosis of unspecified cerebral artery: Secondary | ICD-10-CM | POA: Diagnosis not present

## 2020-02-12 DIAGNOSIS — R54 Age-related physical debility: Secondary | ICD-10-CM | POA: Diagnosis not present

## 2020-02-12 DIAGNOSIS — I5042 Chronic combined systolic (congestive) and diastolic (congestive) heart failure: Secondary | ICD-10-CM | POA: Diagnosis not present

## 2020-02-12 DIAGNOSIS — M6281 Muscle weakness (generalized): Secondary | ICD-10-CM | POA: Diagnosis not present

## 2020-02-13 DIAGNOSIS — M797 Fibromyalgia: Secondary | ICD-10-CM | POA: Diagnosis not present

## 2020-02-13 DIAGNOSIS — I6930 Unspecified sequelae of cerebral infarction: Secondary | ICD-10-CM | POA: Diagnosis not present

## 2020-02-13 DIAGNOSIS — G4700 Insomnia, unspecified: Secondary | ICD-10-CM | POA: Diagnosis not present

## 2020-02-15 DIAGNOSIS — R54 Age-related physical debility: Secondary | ICD-10-CM | POA: Diagnosis not present

## 2020-02-15 DIAGNOSIS — M6281 Muscle weakness (generalized): Secondary | ICD-10-CM | POA: Diagnosis not present

## 2020-02-15 DIAGNOSIS — I6350 Cerebral infarction due to unspecified occlusion or stenosis of unspecified cerebral artery: Secondary | ICD-10-CM | POA: Diagnosis not present

## 2020-02-15 DIAGNOSIS — I4892 Unspecified atrial flutter: Secondary | ICD-10-CM | POA: Diagnosis not present

## 2020-02-15 DIAGNOSIS — I5042 Chronic combined systolic (congestive) and diastolic (congestive) heart failure: Secondary | ICD-10-CM | POA: Diagnosis not present

## 2020-02-16 DIAGNOSIS — M6281 Muscle weakness (generalized): Secondary | ICD-10-CM | POA: Diagnosis not present

## 2020-02-16 DIAGNOSIS — I6350 Cerebral infarction due to unspecified occlusion or stenosis of unspecified cerebral artery: Secondary | ICD-10-CM | POA: Diagnosis not present

## 2020-02-16 DIAGNOSIS — R54 Age-related physical debility: Secondary | ICD-10-CM | POA: Diagnosis not present

## 2020-02-16 DIAGNOSIS — I5042 Chronic combined systolic (congestive) and diastolic (congestive) heart failure: Secondary | ICD-10-CM | POA: Diagnosis not present

## 2020-02-16 DIAGNOSIS — I4892 Unspecified atrial flutter: Secondary | ICD-10-CM | POA: Diagnosis not present

## 2020-02-17 ENCOUNTER — Other Ambulatory Visit: Payer: Self-pay | Admitting: Family Medicine

## 2020-02-17 DIAGNOSIS — H1013 Acute atopic conjunctivitis, bilateral: Secondary | ICD-10-CM

## 2020-02-19 DIAGNOSIS — I4892 Unspecified atrial flutter: Secondary | ICD-10-CM | POA: Diagnosis not present

## 2020-02-19 DIAGNOSIS — I5042 Chronic combined systolic (congestive) and diastolic (congestive) heart failure: Secondary | ICD-10-CM | POA: Diagnosis not present

## 2020-02-19 DIAGNOSIS — I6350 Cerebral infarction due to unspecified occlusion or stenosis of unspecified cerebral artery: Secondary | ICD-10-CM | POA: Diagnosis not present

## 2020-02-19 DIAGNOSIS — R54 Age-related physical debility: Secondary | ICD-10-CM | POA: Diagnosis not present

## 2020-02-19 DIAGNOSIS — M6281 Muscle weakness (generalized): Secondary | ICD-10-CM | POA: Diagnosis not present

## 2020-02-19 NOTE — Telephone Encounter (Signed)
Last OV 12/20/19 Last fill 01/25/20  #2.79ml/0

## 2020-02-22 DIAGNOSIS — I6350 Cerebral infarction due to unspecified occlusion or stenosis of unspecified cerebral artery: Secondary | ICD-10-CM | POA: Diagnosis not present

## 2020-02-22 DIAGNOSIS — I4892 Unspecified atrial flutter: Secondary | ICD-10-CM | POA: Diagnosis not present

## 2020-02-22 DIAGNOSIS — I5042 Chronic combined systolic (congestive) and diastolic (congestive) heart failure: Secondary | ICD-10-CM | POA: Diagnosis not present

## 2020-02-22 DIAGNOSIS — R54 Age-related physical debility: Secondary | ICD-10-CM | POA: Diagnosis not present

## 2020-02-22 DIAGNOSIS — M6281 Muscle weakness (generalized): Secondary | ICD-10-CM | POA: Diagnosis not present

## 2020-02-27 DIAGNOSIS — J302 Other seasonal allergic rhinitis: Secondary | ICD-10-CM | POA: Diagnosis not present

## 2020-02-27 DIAGNOSIS — G894 Chronic pain syndrome: Secondary | ICD-10-CM | POA: Diagnosis not present

## 2020-03-07 DIAGNOSIS — M0570 Rheumatoid arthritis with rheumatoid factor of unspecified site without organ or systems involvement: Secondary | ICD-10-CM | POA: Diagnosis not present

## 2020-03-08 DIAGNOSIS — I4892 Unspecified atrial flutter: Secondary | ICD-10-CM | POA: Diagnosis not present

## 2020-03-08 DIAGNOSIS — Z7901 Long term (current) use of anticoagulants: Secondary | ICD-10-CM | POA: Diagnosis not present

## 2020-03-08 DIAGNOSIS — R54 Age-related physical debility: Secondary | ICD-10-CM | POA: Diagnosis not present

## 2020-03-08 DIAGNOSIS — M6281 Muscle weakness (generalized): Secondary | ICD-10-CM | POA: Diagnosis not present

## 2020-03-08 DIAGNOSIS — Z86718 Personal history of other venous thrombosis and embolism: Secondary | ICD-10-CM | POA: Diagnosis not present

## 2020-03-12 DIAGNOSIS — I5042 Chronic combined systolic (congestive) and diastolic (congestive) heart failure: Secondary | ICD-10-CM | POA: Diagnosis not present

## 2020-03-14 ENCOUNTER — Encounter: Payer: Self-pay | Admitting: Family Medicine

## 2020-03-14 DIAGNOSIS — M545 Low back pain: Secondary | ICD-10-CM | POA: Diagnosis not present

## 2020-03-14 NOTE — Telephone Encounter (Signed)
Please see other Encounter. 

## 2020-03-15 DIAGNOSIS — N179 Acute kidney failure, unspecified: Secondary | ICD-10-CM | POA: Diagnosis not present

## 2020-03-15 DIAGNOSIS — Z886 Allergy status to analgesic agent status: Secondary | ICD-10-CM | POA: Diagnosis not present

## 2020-03-15 DIAGNOSIS — Z91013 Allergy to seafood: Secondary | ICD-10-CM | POA: Diagnosis not present

## 2020-03-15 DIAGNOSIS — R5383 Other fatigue: Secondary | ICD-10-CM | POA: Diagnosis not present

## 2020-03-15 DIAGNOSIS — M069 Rheumatoid arthritis, unspecified: Secondary | ICD-10-CM | POA: Diagnosis not present

## 2020-03-15 DIAGNOSIS — E11311 Type 2 diabetes mellitus with unspecified diabetic retinopathy with macular edema: Secondary | ICD-10-CM | POA: Diagnosis not present

## 2020-03-15 DIAGNOSIS — R197 Diarrhea, unspecified: Secondary | ICD-10-CM | POA: Diagnosis not present

## 2020-03-15 DIAGNOSIS — E1165 Type 2 diabetes mellitus with hyperglycemia: Secondary | ICD-10-CM | POA: Diagnosis not present

## 2020-03-15 DIAGNOSIS — Z79899 Other long term (current) drug therapy: Secondary | ICD-10-CM | POA: Diagnosis not present

## 2020-03-15 DIAGNOSIS — Z7901 Long term (current) use of anticoagulants: Secondary | ICD-10-CM | POA: Diagnosis not present

## 2020-03-15 DIAGNOSIS — Z7982 Long term (current) use of aspirin: Secondary | ICD-10-CM | POA: Diagnosis not present

## 2020-03-15 DIAGNOSIS — A084 Viral intestinal infection, unspecified: Secondary | ICD-10-CM | POA: Diagnosis not present

## 2020-03-15 DIAGNOSIS — Z86718 Personal history of other venous thrombosis and embolism: Secondary | ICD-10-CM | POA: Diagnosis not present

## 2020-03-15 DIAGNOSIS — I5032 Chronic diastolic (congestive) heart failure: Secondary | ICD-10-CM | POA: Diagnosis not present

## 2020-03-15 DIAGNOSIS — M1990 Unspecified osteoarthritis, unspecified site: Secondary | ICD-10-CM | POA: Diagnosis not present

## 2020-03-15 DIAGNOSIS — E785 Hyperlipidemia, unspecified: Secondary | ICD-10-CM | POA: Diagnosis not present

## 2020-03-15 DIAGNOSIS — I110 Hypertensive heart disease with heart failure: Secondary | ICD-10-CM | POA: Diagnosis not present

## 2020-03-15 DIAGNOSIS — Z8673 Personal history of transient ischemic attack (TIA), and cerebral infarction without residual deficits: Secondary | ICD-10-CM | POA: Diagnosis not present

## 2020-03-15 DIAGNOSIS — Z20822 Contact with and (suspected) exposure to covid-19: Secondary | ICD-10-CM | POA: Diagnosis not present

## 2020-03-15 DIAGNOSIS — E860 Dehydration: Secondary | ICD-10-CM | POA: Diagnosis not present

## 2020-03-15 DIAGNOSIS — Z91018 Allergy to other foods: Secondary | ICD-10-CM | POA: Diagnosis not present

## 2020-03-15 DIAGNOSIS — R531 Weakness: Secondary | ICD-10-CM | POA: Diagnosis not present

## 2020-03-15 DIAGNOSIS — Z9109 Other allergy status, other than to drugs and biological substances: Secondary | ICD-10-CM | POA: Diagnosis not present

## 2020-03-16 DIAGNOSIS — N179 Acute kidney failure, unspecified: Secondary | ICD-10-CM | POA: Diagnosis not present

## 2020-03-16 DIAGNOSIS — E860 Dehydration: Secondary | ICD-10-CM | POA: Diagnosis not present

## 2020-03-16 DIAGNOSIS — K591 Functional diarrhea: Secondary | ICD-10-CM | POA: Diagnosis not present

## 2020-03-17 DIAGNOSIS — E860 Dehydration: Secondary | ICD-10-CM | POA: Diagnosis not present

## 2020-03-18 DIAGNOSIS — I4892 Unspecified atrial flutter: Secondary | ICD-10-CM | POA: Diagnosis not present

## 2020-03-18 DIAGNOSIS — R54 Age-related physical debility: Secondary | ICD-10-CM | POA: Diagnosis not present

## 2020-03-18 DIAGNOSIS — Z86718 Personal history of other venous thrombosis and embolism: Secondary | ICD-10-CM | POA: Diagnosis not present

## 2020-03-18 DIAGNOSIS — M6281 Muscle weakness (generalized): Secondary | ICD-10-CM | POA: Diagnosis not present

## 2020-03-18 DIAGNOSIS — R197 Diarrhea, unspecified: Secondary | ICD-10-CM | POA: Diagnosis not present

## 2020-03-18 DIAGNOSIS — E860 Dehydration: Secondary | ICD-10-CM | POA: Diagnosis not present

## 2020-03-19 DIAGNOSIS — E860 Dehydration: Secondary | ICD-10-CM | POA: Diagnosis not present

## 2020-03-19 DIAGNOSIS — I10 Essential (primary) hypertension: Secondary | ICD-10-CM | POA: Diagnosis not present

## 2020-03-19 DIAGNOSIS — A084 Viral intestinal infection, unspecified: Secondary | ICD-10-CM | POA: Diagnosis not present

## 2020-03-20 DIAGNOSIS — Z9109 Other allergy status, other than to drugs and biological substances: Secondary | ICD-10-CM | POA: Diagnosis not present

## 2020-03-20 DIAGNOSIS — R0602 Shortness of breath: Secondary | ICD-10-CM | POA: Diagnosis not present

## 2020-03-20 DIAGNOSIS — M4800 Spinal stenosis, site unspecified: Secondary | ICD-10-CM | POA: Diagnosis not present

## 2020-03-20 DIAGNOSIS — Z886 Allergy status to analgesic agent status: Secondary | ICD-10-CM | POA: Diagnosis not present

## 2020-03-20 DIAGNOSIS — Z91018 Allergy to other foods: Secondary | ICD-10-CM | POA: Diagnosis not present

## 2020-03-20 DIAGNOSIS — M069 Rheumatoid arthritis, unspecified: Secondary | ICD-10-CM | POA: Diagnosis not present

## 2020-03-20 DIAGNOSIS — Z20822 Contact with and (suspected) exposure to covid-19: Secondary | ICD-10-CM | POA: Diagnosis not present

## 2020-03-20 DIAGNOSIS — I10 Essential (primary) hypertension: Secondary | ICD-10-CM | POA: Diagnosis not present

## 2020-03-20 DIAGNOSIS — I5020 Unspecified systolic (congestive) heart failure: Secondary | ICD-10-CM | POA: Diagnosis not present

## 2020-03-20 DIAGNOSIS — E1165 Type 2 diabetes mellitus with hyperglycemia: Secondary | ICD-10-CM | POA: Diagnosis not present

## 2020-03-20 DIAGNOSIS — M1990 Unspecified osteoarthritis, unspecified site: Secondary | ICD-10-CM | POA: Diagnosis not present

## 2020-03-20 DIAGNOSIS — I5032 Chronic diastolic (congestive) heart failure: Secondary | ICD-10-CM | POA: Diagnosis not present

## 2020-03-20 DIAGNOSIS — K5904 Chronic idiopathic constipation: Secondary | ICD-10-CM | POA: Diagnosis not present

## 2020-03-20 DIAGNOSIS — R197 Diarrhea, unspecified: Secondary | ICD-10-CM | POA: Diagnosis not present

## 2020-03-20 DIAGNOSIS — I6930 Unspecified sequelae of cerebral infarction: Secondary | ICD-10-CM | POA: Diagnosis not present

## 2020-03-20 DIAGNOSIS — R531 Weakness: Secondary | ICD-10-CM | POA: Diagnosis not present

## 2020-03-20 DIAGNOSIS — Z7401 Bed confinement status: Secondary | ICD-10-CM | POA: Diagnosis not present

## 2020-03-20 DIAGNOSIS — E785 Hyperlipidemia, unspecified: Secondary | ICD-10-CM | POA: Diagnosis not present

## 2020-03-20 DIAGNOSIS — M797 Fibromyalgia: Secondary | ICD-10-CM | POA: Diagnosis not present

## 2020-03-20 DIAGNOSIS — Z86718 Personal history of other venous thrombosis and embolism: Secondary | ICD-10-CM | POA: Diagnosis not present

## 2020-03-20 DIAGNOSIS — I4892 Unspecified atrial flutter: Secondary | ICD-10-CM | POA: Diagnosis not present

## 2020-03-20 DIAGNOSIS — Z7982 Long term (current) use of aspirin: Secondary | ICD-10-CM | POA: Diagnosis not present

## 2020-03-20 DIAGNOSIS — R278 Other lack of coordination: Secondary | ICD-10-CM | POA: Diagnosis not present

## 2020-03-20 DIAGNOSIS — Z79899 Other long term (current) drug therapy: Secondary | ICD-10-CM | POA: Diagnosis not present

## 2020-03-20 DIAGNOSIS — A084 Viral intestinal infection, unspecified: Secondary | ICD-10-CM | POA: Diagnosis not present

## 2020-03-20 DIAGNOSIS — E11311 Type 2 diabetes mellitus with unspecified diabetic retinopathy with macular edema: Secondary | ICD-10-CM | POA: Diagnosis not present

## 2020-03-20 DIAGNOSIS — I959 Hypotension, unspecified: Secondary | ICD-10-CM | POA: Diagnosis not present

## 2020-03-20 DIAGNOSIS — R54 Age-related physical debility: Secondary | ICD-10-CM | POA: Diagnosis not present

## 2020-03-20 DIAGNOSIS — M0570 Rheumatoid arthritis with rheumatoid factor of unspecified site without organ or systems involvement: Secondary | ICD-10-CM | POA: Diagnosis not present

## 2020-03-20 DIAGNOSIS — I110 Hypertensive heart disease with heart failure: Secondary | ICD-10-CM | POA: Diagnosis not present

## 2020-03-20 DIAGNOSIS — Z7901 Long term (current) use of anticoagulants: Secondary | ICD-10-CM | POA: Diagnosis not present

## 2020-03-20 DIAGNOSIS — M6281 Muscle weakness (generalized): Secondary | ICD-10-CM | POA: Diagnosis not present

## 2020-03-20 DIAGNOSIS — Z91013 Allergy to seafood: Secondary | ICD-10-CM | POA: Diagnosis not present

## 2020-03-20 DIAGNOSIS — R944 Abnormal results of kidney function studies: Secondary | ICD-10-CM | POA: Diagnosis not present

## 2020-03-20 DIAGNOSIS — N179 Acute kidney failure, unspecified: Secondary | ICD-10-CM | POA: Diagnosis not present

## 2020-03-20 DIAGNOSIS — Z8673 Personal history of transient ischemic attack (TIA), and cerebral infarction without residual deficits: Secondary | ICD-10-CM | POA: Diagnosis not present

## 2020-03-20 DIAGNOSIS — G4700 Insomnia, unspecified: Secondary | ICD-10-CM | POA: Diagnosis not present

## 2020-03-20 DIAGNOSIS — E860 Dehydration: Secondary | ICD-10-CM | POA: Diagnosis not present

## 2020-03-21 ENCOUNTER — Telehealth: Payer: Medicare Other | Admitting: Cardiovascular Disease

## 2020-03-21 DIAGNOSIS — R197 Diarrhea, unspecified: Secondary | ICD-10-CM | POA: Diagnosis not present

## 2020-03-21 DIAGNOSIS — I10 Essential (primary) hypertension: Secondary | ICD-10-CM | POA: Diagnosis not present

## 2020-03-21 DIAGNOSIS — M6281 Muscle weakness (generalized): Secondary | ICD-10-CM | POA: Diagnosis not present

## 2020-03-21 DIAGNOSIS — R54 Age-related physical debility: Secondary | ICD-10-CM | POA: Diagnosis not present

## 2020-03-21 DIAGNOSIS — I4892 Unspecified atrial flutter: Secondary | ICD-10-CM | POA: Diagnosis not present

## 2020-03-22 DIAGNOSIS — I10 Essential (primary) hypertension: Secondary | ICD-10-CM | POA: Diagnosis not present

## 2020-03-22 DIAGNOSIS — R54 Age-related physical debility: Secondary | ICD-10-CM | POA: Diagnosis not present

## 2020-03-22 DIAGNOSIS — M6281 Muscle weakness (generalized): Secondary | ICD-10-CM | POA: Diagnosis not present

## 2020-03-22 DIAGNOSIS — R197 Diarrhea, unspecified: Secondary | ICD-10-CM | POA: Diagnosis not present

## 2020-03-22 DIAGNOSIS — I4892 Unspecified atrial flutter: Secondary | ICD-10-CM | POA: Diagnosis not present

## 2020-03-26 DIAGNOSIS — G4700 Insomnia, unspecified: Secondary | ICD-10-CM | POA: Diagnosis not present

## 2020-03-26 DIAGNOSIS — R944 Abnormal results of kidney function studies: Secondary | ICD-10-CM | POA: Diagnosis not present

## 2020-03-26 DIAGNOSIS — M797 Fibromyalgia: Secondary | ICD-10-CM | POA: Diagnosis not present

## 2020-03-26 DIAGNOSIS — I6930 Unspecified sequelae of cerebral infarction: Secondary | ICD-10-CM | POA: Diagnosis not present

## 2020-03-26 DIAGNOSIS — R54 Age-related physical debility: Secondary | ICD-10-CM | POA: Diagnosis not present

## 2020-03-26 DIAGNOSIS — R197 Diarrhea, unspecified: Secondary | ICD-10-CM | POA: Diagnosis not present

## 2020-03-26 DIAGNOSIS — I10 Essential (primary) hypertension: Secondary | ICD-10-CM | POA: Diagnosis not present

## 2020-03-26 DIAGNOSIS — M6281 Muscle weakness (generalized): Secondary | ICD-10-CM | POA: Diagnosis not present

## 2020-03-28 DIAGNOSIS — M6281 Muscle weakness (generalized): Secondary | ICD-10-CM | POA: Diagnosis not present

## 2020-03-28 DIAGNOSIS — R944 Abnormal results of kidney function studies: Secondary | ICD-10-CM | POA: Diagnosis not present

## 2020-03-28 DIAGNOSIS — R54 Age-related physical debility: Secondary | ICD-10-CM | POA: Diagnosis not present

## 2020-03-28 DIAGNOSIS — I10 Essential (primary) hypertension: Secondary | ICD-10-CM | POA: Diagnosis not present

## 2020-03-28 DIAGNOSIS — I4892 Unspecified atrial flutter: Secondary | ICD-10-CM | POA: Diagnosis not present

## 2020-04-01 DIAGNOSIS — R944 Abnormal results of kidney function studies: Secondary | ICD-10-CM | POA: Diagnosis not present

## 2020-04-01 DIAGNOSIS — I4892 Unspecified atrial flutter: Secondary | ICD-10-CM | POA: Diagnosis not present

## 2020-04-01 DIAGNOSIS — R54 Age-related physical debility: Secondary | ICD-10-CM | POA: Diagnosis not present

## 2020-04-01 DIAGNOSIS — M6281 Muscle weakness (generalized): Secondary | ICD-10-CM | POA: Diagnosis not present

## 2020-04-01 DIAGNOSIS — I10 Essential (primary) hypertension: Secondary | ICD-10-CM | POA: Diagnosis not present

## 2020-04-03 DIAGNOSIS — K5904 Chronic idiopathic constipation: Secondary | ICD-10-CM | POA: Diagnosis not present

## 2020-04-03 DIAGNOSIS — I959 Hypotension, unspecified: Secondary | ICD-10-CM | POA: Diagnosis not present

## 2020-04-04 DIAGNOSIS — R54 Age-related physical debility: Secondary | ICD-10-CM | POA: Diagnosis not present

## 2020-04-04 DIAGNOSIS — M6281 Muscle weakness (generalized): Secondary | ICD-10-CM | POA: Diagnosis not present

## 2020-04-04 DIAGNOSIS — M0570 Rheumatoid arthritis with rheumatoid factor of unspecified site without organ or systems involvement: Secondary | ICD-10-CM | POA: Diagnosis not present

## 2020-04-04 DIAGNOSIS — I10 Essential (primary) hypertension: Secondary | ICD-10-CM | POA: Diagnosis not present

## 2020-04-04 DIAGNOSIS — R944 Abnormal results of kidney function studies: Secondary | ICD-10-CM | POA: Diagnosis not present

## 2020-04-15 ENCOUNTER — Other Ambulatory Visit: Payer: Self-pay | Admitting: Cardiovascular Disease

## 2020-04-26 DIAGNOSIS — G894 Chronic pain syndrome: Secondary | ICD-10-CM | POA: Diagnosis not present

## 2020-04-26 DIAGNOSIS — M109 Gout, unspecified: Secondary | ICD-10-CM | POA: Diagnosis not present

## 2020-04-26 DIAGNOSIS — N189 Chronic kidney disease, unspecified: Secondary | ICD-10-CM | POA: Diagnosis not present

## 2020-04-26 DIAGNOSIS — M069 Rheumatoid arthritis, unspecified: Secondary | ICD-10-CM | POA: Diagnosis not present

## 2020-04-26 DIAGNOSIS — M0570 Rheumatoid arthritis with rheumatoid factor of unspecified site without organ or systems involvement: Secondary | ICD-10-CM | POA: Diagnosis not present

## 2020-04-26 DIAGNOSIS — E1169 Type 2 diabetes mellitus with other specified complication: Secondary | ICD-10-CM | POA: Diagnosis not present

## 2020-04-30 DIAGNOSIS — I10 Essential (primary) hypertension: Secondary | ICD-10-CM | POA: Diagnosis not present

## 2020-04-30 DIAGNOSIS — M4800 Spinal stenosis, site unspecified: Secondary | ICD-10-CM | POA: Diagnosis not present

## 2020-04-30 DIAGNOSIS — M069 Rheumatoid arthritis, unspecified: Secondary | ICD-10-CM | POA: Diagnosis not present

## 2020-05-06 DIAGNOSIS — E119 Type 2 diabetes mellitus without complications: Secondary | ICD-10-CM | POA: Diagnosis not present

## 2020-05-06 DIAGNOSIS — E559 Vitamin D deficiency, unspecified: Secondary | ICD-10-CM | POA: Diagnosis not present

## 2020-05-07 DIAGNOSIS — R7989 Other specified abnormal findings of blood chemistry: Secondary | ICD-10-CM | POA: Diagnosis not present

## 2020-05-07 DIAGNOSIS — E559 Vitamin D deficiency, unspecified: Secondary | ICD-10-CM | POA: Diagnosis not present

## 2020-05-08 DIAGNOSIS — I4892 Unspecified atrial flutter: Secondary | ICD-10-CM | POA: Diagnosis not present

## 2020-05-08 DIAGNOSIS — M0570 Rheumatoid arthritis with rheumatoid factor of unspecified site without organ or systems involvement: Secondary | ICD-10-CM | POA: Diagnosis not present

## 2020-05-08 DIAGNOSIS — Z7901 Long term (current) use of anticoagulants: Secondary | ICD-10-CM | POA: Diagnosis not present

## 2020-05-08 DIAGNOSIS — R54 Age-related physical debility: Secondary | ICD-10-CM | POA: Diagnosis not present

## 2020-05-08 DIAGNOSIS — Z79899 Other long term (current) drug therapy: Secondary | ICD-10-CM | POA: Diagnosis not present

## 2020-05-08 DIAGNOSIS — M6281 Muscle weakness (generalized): Secondary | ICD-10-CM | POA: Diagnosis not present

## 2020-05-08 DIAGNOSIS — E559 Vitamin D deficiency, unspecified: Secondary | ICD-10-CM | POA: Diagnosis not present

## 2020-05-08 DIAGNOSIS — R7989 Other specified abnormal findings of blood chemistry: Secondary | ICD-10-CM | POA: Diagnosis not present

## 2020-05-08 DIAGNOSIS — E119 Type 2 diabetes mellitus without complications: Secondary | ICD-10-CM | POA: Diagnosis not present

## 2020-05-21 DIAGNOSIS — M797 Fibromyalgia: Secondary | ICD-10-CM | POA: Diagnosis not present

## 2020-05-21 DIAGNOSIS — I6930 Unspecified sequelae of cerebral infarction: Secondary | ICD-10-CM | POA: Diagnosis not present

## 2020-05-21 DIAGNOSIS — F5102 Adjustment insomnia: Secondary | ICD-10-CM | POA: Diagnosis not present

## 2020-05-24 DIAGNOSIS — K5904 Chronic idiopathic constipation: Secondary | ICD-10-CM | POA: Diagnosis not present

## 2020-05-24 DIAGNOSIS — R55 Syncope and collapse: Secondary | ICD-10-CM | POA: Diagnosis not present

## 2020-05-24 DIAGNOSIS — M0570 Rheumatoid arthritis with rheumatoid factor of unspecified site without organ or systems involvement: Secondary | ICD-10-CM | POA: Diagnosis not present

## 2020-05-30 DIAGNOSIS — M4800 Spinal stenosis, site unspecified: Secondary | ICD-10-CM | POA: Diagnosis not present

## 2020-06-10 DIAGNOSIS — Z961 Presence of intraocular lens: Secondary | ICD-10-CM | POA: Diagnosis not present

## 2020-06-10 DIAGNOSIS — E119 Type 2 diabetes mellitus without complications: Secondary | ICD-10-CM | POA: Diagnosis not present

## 2020-06-11 DIAGNOSIS — M6281 Muscle weakness (generalized): Secondary | ICD-10-CM | POA: Diagnosis not present

## 2020-06-12 DIAGNOSIS — M6281 Muscle weakness (generalized): Secondary | ICD-10-CM | POA: Diagnosis not present

## 2020-06-13 DIAGNOSIS — M6281 Muscle weakness (generalized): Secondary | ICD-10-CM | POA: Diagnosis not present

## 2020-06-14 DIAGNOSIS — M6281 Muscle weakness (generalized): Secondary | ICD-10-CM | POA: Diagnosis not present

## 2020-06-17 DIAGNOSIS — M6281 Muscle weakness (generalized): Secondary | ICD-10-CM | POA: Diagnosis not present

## 2020-06-18 DIAGNOSIS — M6281 Muscle weakness (generalized): Secondary | ICD-10-CM | POA: Diagnosis not present

## 2020-06-19 DIAGNOSIS — M6281 Muscle weakness (generalized): Secondary | ICD-10-CM | POA: Diagnosis not present

## 2020-06-20 DIAGNOSIS — M6281 Muscle weakness (generalized): Secondary | ICD-10-CM | POA: Diagnosis not present

## 2020-06-21 DIAGNOSIS — M6281 Muscle weakness (generalized): Secondary | ICD-10-CM | POA: Diagnosis not present

## 2020-06-24 DIAGNOSIS — M6281 Muscle weakness (generalized): Secondary | ICD-10-CM | POA: Diagnosis not present

## 2020-06-25 DIAGNOSIS — M6281 Muscle weakness (generalized): Secondary | ICD-10-CM | POA: Diagnosis not present

## 2020-06-28 DIAGNOSIS — M6281 Muscle weakness (generalized): Secondary | ICD-10-CM | POA: Diagnosis not present

## 2020-06-29 DIAGNOSIS — M6281 Muscle weakness (generalized): Secondary | ICD-10-CM | POA: Diagnosis not present

## 2020-06-30 DIAGNOSIS — M4800 Spinal stenosis, site unspecified: Secondary | ICD-10-CM | POA: Diagnosis not present

## 2020-07-01 DIAGNOSIS — M6281 Muscle weakness (generalized): Secondary | ICD-10-CM | POA: Diagnosis not present

## 2020-07-02 DIAGNOSIS — M6281 Muscle weakness (generalized): Secondary | ICD-10-CM | POA: Diagnosis not present

## 2020-07-03 DIAGNOSIS — M6281 Muscle weakness (generalized): Secondary | ICD-10-CM | POA: Diagnosis not present

## 2020-07-04 DIAGNOSIS — M6281 Muscle weakness (generalized): Secondary | ICD-10-CM | POA: Diagnosis not present

## 2020-07-05 DIAGNOSIS — M6281 Muscle weakness (generalized): Secondary | ICD-10-CM | POA: Diagnosis not present

## 2020-07-08 DIAGNOSIS — M6281 Muscle weakness (generalized): Secondary | ICD-10-CM | POA: Diagnosis not present

## 2020-07-09 DIAGNOSIS — R252 Cramp and spasm: Secondary | ICD-10-CM | POA: Diagnosis not present

## 2020-07-09 DIAGNOSIS — J3089 Other allergic rhinitis: Secondary | ICD-10-CM | POA: Diagnosis not present

## 2020-07-10 DIAGNOSIS — I509 Heart failure, unspecified: Secondary | ICD-10-CM | POA: Diagnosis not present

## 2020-07-10 DIAGNOSIS — M6281 Muscle weakness (generalized): Secondary | ICD-10-CM | POA: Diagnosis not present

## 2020-07-10 DIAGNOSIS — R05 Cough: Secondary | ICD-10-CM | POA: Diagnosis not present

## 2020-07-12 ENCOUNTER — Telehealth: Payer: Self-pay | Admitting: Cardiovascular Disease

## 2020-07-12 DIAGNOSIS — Z5181 Encounter for therapeutic drug level monitoring: Secondary | ICD-10-CM | POA: Diagnosis not present

## 2020-07-12 DIAGNOSIS — M6281 Muscle weakness (generalized): Secondary | ICD-10-CM | POA: Diagnosis not present

## 2020-07-12 DIAGNOSIS — I5042 Chronic combined systolic (congestive) and diastolic (congestive) heart failure: Secondary | ICD-10-CM | POA: Diagnosis not present

## 2020-07-12 DIAGNOSIS — I5022 Chronic systolic (congestive) heart failure: Secondary | ICD-10-CM | POA: Diagnosis not present

## 2020-07-12 DIAGNOSIS — I10 Essential (primary) hypertension: Secondary | ICD-10-CM | POA: Diagnosis not present

## 2020-07-12 NOTE — Telephone Encounter (Signed)
Would be better to have her see in the office if possible by me or an APP in the next couple weeks

## 2020-07-12 NOTE — Telephone Encounter (Signed)
Will bring  to Dr Leonides Sake attention Also appears pt was suppose to  have 6 week virtual visit and it doesn't look like visit was ever done .[name] [name]

## 2020-07-12 NOTE — Telephone Encounter (Signed)
PT'S DAUGHTER AWARE AN APPT MADE WITH HAO MENG PA 07/31/20 AT 3:45 PM .Zack Seal

## 2020-07-12 NOTE — Telephone Encounter (Signed)
  [name] is calling because the NP at the nursing home did a chest xray on the patient and found fluid around her heart. The NP increased her lasix and daughter would like to make sure that Dr. Duke Salvia is aware. She would like a call back from the nurse.

## 2020-07-15 DIAGNOSIS — E559 Vitamin D deficiency, unspecified: Secondary | ICD-10-CM | POA: Diagnosis not present

## 2020-07-15 DIAGNOSIS — I509 Heart failure, unspecified: Secondary | ICD-10-CM | POA: Diagnosis not present

## 2020-07-15 DIAGNOSIS — M6281 Muscle weakness (generalized): Secondary | ICD-10-CM | POA: Diagnosis not present

## 2020-07-15 DIAGNOSIS — I10 Essential (primary) hypertension: Secondary | ICD-10-CM | POA: Diagnosis not present

## 2020-07-17 DIAGNOSIS — M6281 Muscle weakness (generalized): Secondary | ICD-10-CM | POA: Diagnosis not present

## 2020-07-17 DIAGNOSIS — R0989 Other specified symptoms and signs involving the circulatory and respiratory systems: Secondary | ICD-10-CM | POA: Diagnosis not present

## 2020-07-17 DIAGNOSIS — J9811 Atelectasis: Secondary | ICD-10-CM | POA: Diagnosis not present

## 2020-07-19 DIAGNOSIS — I10 Essential (primary) hypertension: Secondary | ICD-10-CM | POA: Diagnosis not present

## 2020-07-19 DIAGNOSIS — D509 Iron deficiency anemia, unspecified: Secondary | ICD-10-CM | POA: Diagnosis not present

## 2020-07-19 DIAGNOSIS — M6281 Muscle weakness (generalized): Secondary | ICD-10-CM | POA: Diagnosis not present

## 2020-07-23 ENCOUNTER — Telehealth: Payer: Self-pay | Admitting: Cardiovascular Disease

## 2020-07-23 DIAGNOSIS — F5102 Adjustment insomnia: Secondary | ICD-10-CM | POA: Diagnosis not present

## 2020-07-23 DIAGNOSIS — M797 Fibromyalgia: Secondary | ICD-10-CM | POA: Diagnosis not present

## 2020-07-23 DIAGNOSIS — I6930 Unspecified sequelae of cerebral infarction: Secondary | ICD-10-CM | POA: Diagnosis not present

## 2020-07-23 NOTE — Telephone Encounter (Signed)
Called daughter,  She is in a rehab center- she called recently about fluid and swelling in legs, daughter is now calling to let us know that her mother now has Pneumonia, and they are currently shut down due to a COVID outbreak- and they had to cancel her upcoming appointment and rescheduled.  They increased Lasix- and there should be something coming through from the facility about having a cardiologist come to see her. I advised that Juliette Alcide was off today, returning tomorrow- she states that she would like to have her look through her things and see if she sees this paperwork.   Will route to Neal to make her aware.

## 2020-07-23 NOTE — Telephone Encounter (Signed)
New Message:    Daughter would like flor Juliette Alcide to call her please.Marland Kitchen She would like to give her an update on pt's condition.

## 2020-07-24 NOTE — Telephone Encounter (Signed)
Spoke with daughter and explained nothing had been received from facility. Did advise that if facility in [place] felt patient needed to be evaluated by cardiologist to allow them to arrange.

## 2020-07-26 ENCOUNTER — Telehealth: Payer: Self-pay | Admitting: Family Medicine

## 2020-07-26 DIAGNOSIS — M069 Rheumatoid arthritis, unspecified: Secondary | ICD-10-CM | POA: Diagnosis not present

## 2020-07-26 DIAGNOSIS — R54 Age-related physical debility: Secondary | ICD-10-CM | POA: Diagnosis not present

## 2020-07-26 DIAGNOSIS — E119 Type 2 diabetes mellitus without complications: Secondary | ICD-10-CM | POA: Diagnosis not present

## 2020-07-26 DIAGNOSIS — M6281 Muscle weakness (generalized): Secondary | ICD-10-CM | POA: Diagnosis not present

## 2020-07-26 DIAGNOSIS — I10 Essential (primary) hypertension: Secondary | ICD-10-CM | POA: Diagnosis not present

## 2020-07-26 NOTE — Progress Notes (Signed)
  Chronic Care Management   Outreach Note  07/26/2020 Name: [name] [name] MRN: [id number] DOB: 1930/01/03  Referred by: Overton Mam, DO Reason for referral : No chief complaint on file.   An unsuccessful telephone outreach was attempted today. The patient was referred to the pharmacist for assistance with care management and care coordination.   Follow Up Plan:   Carley Perdue [employer] Scheduler

## 2020-07-31 ENCOUNTER — Ambulatory Visit: Payer: Medicare Other | Admitting: Physician Assistant

## 2020-08-01 ENCOUNTER — Ambulatory Visit: Payer: Medicare Other | Admitting: Physician Assistant

## 2020-08-06 ENCOUNTER — Telehealth: Payer: Self-pay | Admitting: Family Medicine

## 2020-08-06 NOTE — Progress Notes (Signed)
  Chronic Care Management   Outreach Note  08/06/2020 Name: [name] [name] MRN: [id number] DOB: Nov 25, 1929  Referred by: Overton Mam, DO Reason for referral : No chief complaint on file.   A second unsuccessful telephone outreach was attempted today. The patient was referred to pharmacist for assistance with care management and care coordination.  Follow Up Plan:   Carley Perdue [employer] Scheduler

## 2020-08-08 DIAGNOSIS — E119 Type 2 diabetes mellitus without complications: Secondary | ICD-10-CM | POA: Diagnosis not present

## 2020-08-08 DIAGNOSIS — R54 Age-related physical debility: Secondary | ICD-10-CM | POA: Diagnosis not present

## 2020-08-08 DIAGNOSIS — I639 Cerebral infarction, unspecified: Secondary | ICD-10-CM | POA: Diagnosis not present

## 2020-08-08 DIAGNOSIS — I10 Essential (primary) hypertension: Secondary | ICD-10-CM | POA: Diagnosis not present

## 2020-08-08 DIAGNOSIS — D631 Anemia in chronic kidney disease: Secondary | ICD-10-CM | POA: Diagnosis not present

## 2020-08-08 DIAGNOSIS — D509 Iron deficiency anemia, unspecified: Secondary | ICD-10-CM | POA: Diagnosis not present

## 2020-08-08 DIAGNOSIS — M6281 Muscle weakness (generalized): Secondary | ICD-10-CM | POA: Diagnosis not present

## 2020-08-16 ENCOUNTER — Telehealth: Payer: Self-pay | Admitting: Family Medicine

## 2020-08-16 DIAGNOSIS — M6281 Muscle weakness (generalized): Secondary | ICD-10-CM | POA: Diagnosis not present

## 2020-08-16 NOTE — Progress Notes (Signed)
  Chronic Care Management   Outreach Note  08/16/2020 Name: [name] [name] MRN: [id number] DOB: 09-28-1930  Referred by: Overton Mam, DO Reason for referral : No chief complaint on file.   Third unsuccessful telephone outreach was attempted today. The patient was referred to the pharmacist for assistance with care management and care coordination.   Follow Up Plan:   Carley Perdue [employer] Scheduler

## 2020-08-19 DIAGNOSIS — M6281 Muscle weakness (generalized): Secondary | ICD-10-CM | POA: Diagnosis not present

## 2020-08-20 DIAGNOSIS — M6281 Muscle weakness (generalized): Secondary | ICD-10-CM | POA: Diagnosis not present

## 2020-08-21 DIAGNOSIS — M6281 Muscle weakness (generalized): Secondary | ICD-10-CM | POA: Diagnosis not present

## 2020-08-22 DIAGNOSIS — E119 Type 2 diabetes mellitus without complications: Secondary | ICD-10-CM | POA: Diagnosis not present

## 2020-08-22 DIAGNOSIS — R54 Age-related physical debility: Secondary | ICD-10-CM | POA: Diagnosis not present

## 2020-08-22 DIAGNOSIS — M6281 Muscle weakness (generalized): Secondary | ICD-10-CM | POA: Diagnosis not present

## 2020-08-22 DIAGNOSIS — I10 Essential (primary) hypertension: Secondary | ICD-10-CM | POA: Diagnosis not present

## 2020-08-22 DIAGNOSIS — D631 Anemia in chronic kidney disease: Secondary | ICD-10-CM | POA: Diagnosis not present

## 2020-08-24 DIAGNOSIS — M6281 Muscle weakness (generalized): Secondary | ICD-10-CM | POA: Diagnosis not present

## 2020-08-24 DIAGNOSIS — I5042 Chronic combined systolic (congestive) and diastolic (congestive) heart failure: Secondary | ICD-10-CM | POA: Diagnosis not present

## 2020-08-24 DIAGNOSIS — R2681 Unsteadiness on feet: Secondary | ICD-10-CM | POA: Diagnosis not present

## 2020-08-25 DIAGNOSIS — M6281 Muscle weakness (generalized): Secondary | ICD-10-CM | POA: Diagnosis not present

## 2020-08-25 DIAGNOSIS — R2681 Unsteadiness on feet: Secondary | ICD-10-CM | POA: Diagnosis not present

## 2020-08-25 DIAGNOSIS — I5042 Chronic combined systolic (congestive) and diastolic (congestive) heart failure: Secondary | ICD-10-CM | POA: Diagnosis not present

## 2020-08-26 ENCOUNTER — Encounter: Payer: Self-pay | Admitting: Internal Medicine

## 2020-08-26 DIAGNOSIS — R2681 Unsteadiness on feet: Secondary | ICD-10-CM | POA: Diagnosis not present

## 2020-08-26 DIAGNOSIS — M6281 Muscle weakness (generalized): Secondary | ICD-10-CM | POA: Diagnosis not present

## 2020-08-26 DIAGNOSIS — I5042 Chronic combined systolic (congestive) and diastolic (congestive) heart failure: Secondary | ICD-10-CM | POA: Diagnosis not present

## 2020-08-27 ENCOUNTER — Non-Acute Institutional Stay (SKILLED_NURSING_FACILITY): Payer: Medicare Other | Admitting: Internal Medicine

## 2020-08-27 DIAGNOSIS — M0579 Rheumatoid arthritis with rheumatoid factor of multiple sites without organ or systems involvement: Secondary | ICD-10-CM | POA: Diagnosis not present

## 2020-08-27 DIAGNOSIS — M48061 Spinal stenosis, lumbar region without neurogenic claudication: Secondary | ICD-10-CM

## 2020-08-27 DIAGNOSIS — I2609 Other pulmonary embolism with acute cor pulmonale: Secondary | ICD-10-CM

## 2020-08-27 DIAGNOSIS — E1169 Type 2 diabetes mellitus with other specified complication: Secondary | ICD-10-CM

## 2020-08-27 DIAGNOSIS — E785 Hyperlipidemia, unspecified: Secondary | ICD-10-CM

## 2020-08-27 DIAGNOSIS — I2782 Chronic pulmonary embolism: Secondary | ICD-10-CM

## 2020-08-27 DIAGNOSIS — Z8673 Personal history of transient ischemic attack (TIA), and cerebral infarction without residual deficits: Secondary | ICD-10-CM

## 2020-08-27 DIAGNOSIS — Z789 Other specified health status: Secondary | ICD-10-CM

## 2020-08-27 DIAGNOSIS — R29898 Other symptoms and signs involving the musculoskeletal system: Secondary | ICD-10-CM

## 2020-08-27 DIAGNOSIS — M1A09X1 Idiopathic chronic gout, multiple sites, with tophus (tophi): Secondary | ICD-10-CM

## 2020-08-27 DIAGNOSIS — M6281 Muscle weakness (generalized): Secondary | ICD-10-CM | POA: Diagnosis not present

## 2020-08-27 DIAGNOSIS — I824Z1 Acute embolism and thrombosis of unspecified deep veins of right distal lower extremity: Secondary | ICD-10-CM

## 2020-08-27 DIAGNOSIS — R2681 Unsteadiness on feet: Secondary | ICD-10-CM | POA: Diagnosis not present

## 2020-08-27 DIAGNOSIS — E1121 Type 2 diabetes mellitus with diabetic nephropathy: Secondary | ICD-10-CM

## 2020-08-27 DIAGNOSIS — I110 Hypertensive heart disease with heart failure: Secondary | ICD-10-CM

## 2020-08-27 DIAGNOSIS — I5042 Chronic combined systolic (congestive) and diastolic (congestive) heart failure: Secondary | ICD-10-CM | POA: Diagnosis not present

## 2020-08-27 DIAGNOSIS — Z23 Encounter for immunization: Secondary | ICD-10-CM | POA: Diagnosis not present

## 2020-08-27 NOTE — Progress Notes (Signed)
Provider:  Rexene Edison. Mariea Clonts, D.O., C.[name] Location:  [facility] Room Number: 301-D Place of Service:  SNF (31)  PCP: Ronnald Nian, DO Patient Care Team: Ronnald Nian, DO as PCP - General (Family Medicine) Skeet Latch, MD as PCP - Cardiology (Cardiology) Jackelyn Knife, MD as Rounding Team (Internal Medicine) Lahoma Rocker, MD as Consulting Physician (Rheumatology)  Extended Emergency Contact Information Primary Emergency Contact: Hampton,Jeannette Address: [address]          [place], [place] [address] Johnnette Litter of [place] Phone: [phone] Relation: Daughter Secondary Emergency Contact: [name] Address: [address]          [place], [place] [address] Johnnette Litter of [place] Phone: [phone] Relation: Daughter  Code Status: FULL CODE Goals of Care: Advanced Directive information Advanced Directives 08/26/2020  Does Patient Have a Medical Advance Directive? No  Type of Advance Directive -  Does patient want to make changes to medical advance directive? No - Patient declined  Copy of [place] in Chart? -  Would patient like information on creating a medical advance directive? No - Patient declined   Chief Complaint  Patient presents with  . New Admit To SNF    New admission to [facility] SNF   HPI: Patient is a 86 y.o. female seen today for admission to [facility] from [facility] in [place] for long-term care.  There she was followed by Dr. Niel Hummer and Dr. Joanette Gula.  She has a past medical history significant for lack of coordination, rheumatoid arthritis, and prior transient ischemic attack.  Upon further review of note she is also had allergic rhinitis, anemia of chronic disease, type 2 diabetes with proliferative retinopathy and macular edema of her right eye anoxic brain injury, anxiety, atrial flutter, coronary artery disease,  chronic combined systolic and diastolic heart failure, chronic kidney disease stage III, constipation, fibromyalgia, DVT, gout, hypertension, hyperlipidemia, insomnia, osteoarthritis, and pulmonary embolism.  Appears her daughters [name] and Mardene Celeste are contacts.  The face sheet that came with the patient indicates that she has a MOST form for full scope of treatment antibiotics and IV fluids with FULL CODE STATUS.  Does not mention if she wanted a feeding tube.    [facility] indicate that she was there from May 14 until this month when she was discharged here.  Prior to that she had been hospitalized at [facility] in [place].  Orders from [facility] indicated that she was requiring some monitoring with her meals for the amount of intake.  She had been getting PT 3 times a week.  She had significant anxiety and was requiring compassionate visits.  She was getting CBGs before meals.  The med list printed out on October 16 had several more items on it versus her FL 2 that was completed on October 14 so it appears that the [facility] is the most up-to-date medication list.  There are a few labs included from early this year.  She had a negative Covid test on October 11.  She had her Pfizer Covid vaccines on February 25 and 17th.  When seen, she reported doing well.  She is wanting more therapy here and says that where she had been, there were not enough staff and everything had changed.  She has family in both [place] and here.  Her other concern was meeting with the dietitian to get on a  low sodium diet.  Otherwise, she feels well.  She was up sitting in her wheelchair.    Past Medical History:  Diagnosis Date  . Allergic rhinitis due to pollen 04/27/2007  . Arthritis    "in q joint" (08/07/2013)  . CHF (congestive heart failure) ([place])   . Coronary atherosclerosis of native coronary artery   . Cough   . Edema 05/03/2013  . Exertional shortness of breath    "sometimes"  (08/07/2013)  . First degree atrioventricular block   . Gout, unspecified 04/19/2013  . Hyperlipidemia 04/27/2007  . Hypertension   . Midline low back pain without sciatica 06/27/2014  . Muscle spasm   . Muscle weakness (generalized)   . Onychia and paronychia of toe   . Osteoarthrosis, unspecified whether generalized or localized, unspecified site 04/27/2007  . Other fall   . Other malaise and fatigue   . Pneumonia 05/2018  . Prepatellar bursitis   . Pulmonary embolism ([place]) 07/2018  . Rheumatic nodule ([place]) 06/28/2017  . Seizures ([place])   . Stroke ([place]) 01/17/2014  . Type II diabetes mellitus ([place])    "fasting 90-110s" (08/07/2013)  . Unspecified vitamin D deficiency    Past Surgical History:  Procedure Laterality Date  . ABDOMINAL HYSTERECTOMY  04/1980  . APPENDECTOMY  1960  . CARDIAC CATHETERIZATION  2003  . CATARACT EXTRACTION W/ INTRAOCULAR LENS  IMPLANT, BILATERAL Bilateral ~ 2012  . CHOLECYSTECTOMY N/A 06/26/2018   Procedure: LAPAROSCOPIC CHOLECYSTECTOMY POSSIBLE INTRAOPERATIVE CHOLANGIOGRAM;  Surgeon: Coralie Keens, MD;  Location: [facility];  Service: General;  Laterality: N/A;  . JOINT REPLACEMENT    . REPLACEMENT TOTAL KNEE Right 09/2005  . RIGHT/LEFT HEART CATH AND CORONARY ANGIOGRAPHY N/A 06/01/2018   Procedure: RIGHT/LEFT HEART CATH AND CORONARY ANGIOGRAPHY;  Surgeon: Jettie Booze, MD;  Location: [facility];  Service: Cardiovascular;  Laterality: N/A;  . SHOULDER OPEN ROTATOR CUFF REPAIR Right 07/1999  . TONSILLECTOMY  08/1974  . TOTAL KNEE ARTHROPLASTY Left 08/07/2013   Procedure: TOTAL KNEE ARTHROPLASTY- LEFT;  Surgeon: Vickey Huger, MD;  Location: [facility];  Service: Orthopedics;  Laterality: Left;  . TRANSTHORACIC ECHOCARDIOGRAM  2003   EF 55-65%; mild concentric LVH    Social History   Socioeconomic History  . Marital status: Widowed    Spouse name: Not on file  . Number of children: 3  . Years of education: 17  . Highest education level: High  school graduate  Occupational History  . Not on file  Tobacco Use  . Smoking status: Never Smoker  . Smokeless tobacco: Never Used  Vaping Use  . Vaping Use: Never used  Substance and Sexual Activity  . Alcohol use: No    Alcohol/week: 0.0 standard drinks  . Drug use: No  . Sexual activity: Not Currently  Other Topics Concern  . Not on file  Social History Narrative   Widowed   Patient is currently unable to ambulate at all.   Social Determinants of Health   Financial Resource Strain:   . Difficulty of Paying Living Expenses: Not on file  Food Insecurity:   . Worried About Charity fundraiser in the Last Year: Not on file  . Ran Out of Food in the Last Year: Not on file  Transportation Needs:   . Lack of Transportation (Medical): Not on file  . Lack of Transportation (Non-Medical): Not on file  Physical Activity:   . Days of Exercise per Week: Not on file  . Minutes of Exercise per  Session: Not on file  Stress:   . Feeling of Stress : Not on file  Social Connections:   . Frequency of Communication with Friends and Family: Not on file  . Frequency of Social Gatherings with Friends and Family: Not on file  . Attends Religious Services: Not on file  . Active Member of Clubs or Organizations: Not on file  . Attends Archivist Meetings: Not on file  . Marital Status: Not on file    reports that she has never smoked. She has never used smokeless tobacco. She reports that she does not drink alcohol and does not use drugs.  Functional Status Survey:    Family History  Problem Relation Age of Onset  . Heart attack Father     Health Maintenance  Topic Date Due  . TETANUS/TDAP  04/14/2015  . PNA vac Low Risk Adult (2 of 2 - PCV13) 09/11/2016  . FOOT EXAM  08/30/2019  . HEMOGLOBIN A1C  10/13/2019  . INFLUENZA VACCINE  06/09/2020  . OPHTHALMOLOGY EXAM  09/07/2020  . DEXA SCAN  Completed  . COVID-19 Vaccine  Completed    Allergies  Allergen Reactions  .  Clonidine Derivatives Swelling    [name]  . Fish Allergy Diarrhea, Swelling and Other (See Comments)    Turns skin "black," but can tolerate white fish Salmon- Diarrhea  . Shellfish Allergy Hives  . Doxycycline Rash  . Indomethacin Other (See Comments)    Reaction not recalled by the patient  . Lyrica [Pregabalin] Other (See Comments)    Hallucinations   . Methyldopa Other (See Comments)    Aldomet (for hypertension): Reaction not recalled by the patient  . Morphine And Related Other (See Comments)    Family reports it drops her bp that she needs iv fluids  . Orange Fruit [Citrus] Other (See Comments)    Indigestion/heartburn  . Strawberry (Diagnostic) Itching  . Cetirizine Hcl Itching and Rash  . Codeine Itching  . Levaquin [Levofloxacin In D5w] Rash  . Tomato Rash    Outpatient Encounter Medications as of 08/27/2020  Medication Sig  . acetaminophen (TYLENOL) 500 MG tablet Take 1,000 mg by mouth 3 (three) times daily.   . benzonatate (TESSALON) 100 MG capsule Take by mouth 3 (three) times daily.  . hydroxychloroquine (PLAQUENIL) 200 MG tablet Take 1 tablet (200 mg total) by mouth 2 (two) times daily.  . isosorbide mononitrate (IMDUR) 30 MG 24 hr tablet TAKE 1 TABLET BY MOUTH EVERY DAY  . leflunomide (ARAVA) 20 MG tablet Take 20 mg by mouth daily.  . melatonin 3 MG TABS tablet Take 3 mg by mouth 2 (two) times daily.  . Olopatadine HCl 0.2 % SOLN INSTILL 1 DROP IN BOTH EYES DAILY  . Omega 3-6-9 Fatty Acids (OMEGA 3-6-9 COMPLEX) CAPS Take 1 capsule by mouth daily. Omega 3 Black  . polyethylene glycol (MIRALAX / GLYCOLAX) 17 g packet Take 17 g by mouth daily.  . potassium chloride SA (K-DUR) 20 MEQ tablet Take 20 mEq by mouth See admin instructions. Monday and Thursday  . predniSONE (DELTASONE) 5 MG tablet Take 1.5 tablets (7.5 mg total) by mouth daily with breakfast.  . senna-docusate (SENOKOT-S) 8.6-50 MG  tablet Take 1 tablet by mouth in the morning and at bedtime. At bedtime  . sitaGLIPtin (JANUVIA) 25 MG tablet Take 25 mg by mouth daily.  . traMADol (ULTRAM) 50 MG tablet Take by mouth every 8 (eight) hours  as needed.  Alveda Reasons 20 MG TABS tablet TAKE 1 TABLET(20 MG) BY MOUTH DAILY  . [DISCONTINUED] albuterol (PROVENTIL HFA;VENTOLIN HFA) 108 (90 Base) MCG/ACT inhaler Inhale 1-2 puffs into the lungs every 6 (six) hours as needed for wheezing or shortness of breath.  . [DISCONTINUED] allopurinol (ZYLOPRIM) 100 MG tablet TAKE 1 TABLET BY MOUTH EVERY DAY AT BEDTIME  . [DISCONTINUED] benzonatate (TESSALON) 100 MG capsule Take 1 capsule (100 mg total) by mouth 2 (two) times daily as needed for cough.  . [DISCONTINUED] Blood Glucose Monitoring Suppl (ACCU-CHEK AVIVA PLUS) w/Device KIT Use to check blood sugars 1-2 times daily.  . [DISCONTINUED] carvedilol (COREG) 25 MG tablet TAKE 1 TABLET BY MOUTH TWICE DAILY  . [DISCONTINUED] D3-1000 25 MCG (1000 UT) capsule Take 1 capsule (1,000 Units total) by mouth daily.  . [DISCONTINUED] docusate sodium (COLACE) 100 MG capsule Take 1 capsule (100 mg total) by mouth daily as needed for mild constipation.  . [DISCONTINUED] ENSURE (ENSURE) Take 237 mLs by mouth every evening.  . [DISCONTINUED] ENTRESTO 49-51 MG TAKE 1 TABLET BY MOUTH EVERY DAY  . [DISCONTINUED] furosemide (LASIX) 40 MG tablet Take 1 tablet (40 mg total) by mouth 2 (two) times daily.  . [DISCONTINUED] gabapentin (NEURONTIN) 100 MG capsule TAKE 1 CAPSULE BY MOUTH THREE TIMES DAILY  . [DISCONTINUED] glucose blood (ACCU-CHEK AVIVA PLUS) test strip Use to test blood sugars 1-2 times daily.  . [DISCONTINUED] hydrALAZINE (APRESOLINE) 25 MG tablet TAKE 1 TABLET(25 MG) BY MOUTH THREE TIMES DAILY  . [DISCONTINUED] Lancets (ACCU-CHEK SOFT TOUCH) lancets Use to check blood sugars 1-2 times daily.  . [DISCONTINUED] Probiotic CAPS Take 1 capsule by mouth daily.  . [DISCONTINUED] sitaGLIPtin (JANUVIA) 25 MG tablet  TAKE 1/2 TABLET BY MOUTH(12,5 MG) EVERY DAY  . [DISCONTINUED] traMADol (ULTRAM) 50 MG tablet TAKE 1 TO 2 TABLETS BY MOUTH EVERY 6 HOURS AS NEEDED FOR PAIN   No facility-administered encounter medications on file as of 08/27/2020.    Review of Systems  Constitutional: Negative for chills, fever and malaise/fatigue.  HENT: Negative for congestion and sore throat.   Eyes:       Glasses  Respiratory: Negative for cough and shortness of breath.   Cardiovascular: Positive for leg swelling. Negative for chest pain, palpitations, orthopnea and PND.  Gastrointestinal: Negative for abdominal pain, blood in stool, constipation and melena.  Genitourinary: Negative for dysuria.  Musculoskeletal: Positive for joint pain. Negative for falls.  Skin: Negative for itching and rash.  Neurological: Negative for dizziness and loss of consciousness.  Psychiatric/Behavioral: Negative for depression and memory loss. The patient is not nervous/anxious and does not have insomnia.     Vitals:   08/26/20 1551  BP: (!) 154/87  Pulse: (!) 103  Temp: (!) 96.7 F (35.9 C)  TempSrc: Oral  Weight: 180 lb (81.6 kg)   Body mass index is 30.9 kg/m. Physical Exam Vitals reviewed.  Constitutional:      General: She is not in acute distress.    Appearance: Normal appearance. She is obese. She is not ill-appearing.  HENT:     Head: Normocephalic and atraumatic.     Right Ear: External ear normal.     Left Ear: External ear normal.     Nose: Nose normal.     Mouth/Throat:     Pharynx: Oropharynx is clear.  Eyes:     Extraocular Movements: Extraocular movements intact.     Conjunctiva/sclera: Conjunctivae normal.     Pupils: Pupils are equal, round, and  reactive to light.     Comments: glasses  Cardiovascular:     Rate and Rhythm: Normal rate and regular rhythm.  Pulmonary:     Effort: Pulmonary effort is normal.     Breath sounds: Normal breath sounds. No wheezing, rhonchi or rales.  Abdominal:      General: Bowel sounds are normal.     Palpations: Abdomen is soft.     Tenderness: There is no abdominal tenderness. There is no guarding or rebound.  Musculoskeletal:        General: Tenderness present.     Cervical back: Neck supple.     Right lower leg: Edema present.     Left lower leg: Edema present.     Comments: Joint deformities of hands, wrists  Lymphadenopathy:     Cervical: No cervical adenopathy.  Skin:    General: Skin is warm and dry.  Neurological:     General: No focal deficit present.     Mental Status: She is alert and oriented to person, place, and time.     Cranial Nerves: No cranial nerve deficit.     Motor: Weakness present.     Gait: Gait abnormal.     Comments: Right leg weakness  Psychiatric:        Mood and Affect: Mood normal.        Behavior: Behavior normal.     Labs reviewed: Basic Metabolic Panel: Recent Labs    12/13/19 0447 12/13/19 1228 12/20/19 1344  NA 140 138 139  K 3.9 4.0 3.9  CL 103 104 104  CO2 $Re'26 26 28  'XTb$ GLUCOSE 96 99 94  BUN 37* 35* 24*  CREATININE 1.26* 1.16* 0.85  CALCIUM 8.9 8.6* 9.1   Liver Function Tests: Recent Labs    09/24/19 2009 10/29/19 2252  AST 14* 17  ALT 10 11  ALKPHOS 50 49  BILITOT 0.8 0.1*  PROT 6.3* 6.0*  ALBUMIN 3.4* 2.9*   No results for input(s): LIPASE, AMYLASE in the last 8760 hours. No results for input(s): AMMONIA in the last 8760 hours. CBC: Recent Labs    09/13/19 1018 09/24/19 2017 12/12/19 2322 12/13/19 0447 12/20/19 1344  WBC 6.6   < > 9.2 8.8 7.2  NEUTROABS 3.8  --  6.7  --   --   HGB 8.3*   < > 10.5* 10.6* 9.8*  HCT 25.8*   < > 32.2* 33.7* 29.8*  MCV 100.8*   < > 101.6* 101.5* 97.4  PLT 294   < > 263 251 279.0   < > = values in this interval not displayed.   Cardiac Enzymes: No results for input(s): CKTOTAL, CKMB, CKMBINDEX, TROPONINI in the last 8760 hours. BNP: Invalid input(s): POCBNP Lab Results  Component Value Date   HGBA1C 5.9 (H) 06/25/2018   Lab Results    Component Value Date   TSH 1.521 03/29/2019   Lab Results  Component Value Date   VITAMINB12 510 12/20/2019   Lab Results  Component Value Date   FOLATE 13.3 12/20/2019   Lab Results  Component Value Date   IRON 35 01/11/2019   TIBC 249 (L) 01/11/2019   FERRITIN 304 01/11/2019    Imaging and Procedures obtained prior to SNF admission: MR BRAIN WO CONTRAST  Result Date: 12/13/2019 CLINICAL DATA:  Encephalopathy. Sudden onset dizziness associated with hypotension at home. EXAM: MRI HEAD WITHOUT CONTRAST TECHNIQUE: Multiplanar, multiecho pulse sequences of the brain and surrounding structures were obtained without intravenous contrast. COMPARISON:  01/10/2019  FINDINGS: Brain: No acute infarction, hemorrhage, hydrocephalus, extra-axial collection or mass lesion. Confluent ischemic gliosis in the deep cerebral white matter with discrete insult causing wallerian changes crossing the anterior corpus callosum. Cerebral volume loss with central predilection, nonspecific. Vascular: Preserved flow voids Skull and upper cervical spine: Normal marrow signal. Mild upper cervical facet degeneration. Sinuses/Orbits: Bilateral cataract resection. Mild opacification of right mastoid air cells and the left sphenoid sinus. Right nasopharyngeal retention cyst. Mild mucosal thickening in the left mastoid air cells. Other: Chronic simple cystic appearance in the superficial left parotid. IMPRESSION: Senescent changes without acute finding or new abnormality when compared to March 2020. Electronically Signed   By: Monte Fantasia [name]   On: 12/13/2019 07:09   DG Chest Port 1 View  Result Date: 12/13/2019 CLINICAL DATA:  Shortness of breath EXAM: PORTABLE CHEST 1 VIEW COMPARISON:  Chest x-ray 10/29/2019 FINDINGS: Right left mislabeling based on previous imaging. Cardiomegaly and aortic tortuosity. There is no edema, consolidation, effusion, or pneumothorax. No acute osseous finding.Advanced glenohumeral  osteoarthritis on both sides. IMPRESSION: 1. No acute finding when compared to prior. 2. Cardiomegaly. Electronically Signed   By: Monte Fantasia [name]   On: 12/13/2019 05:39    Assessment/Plan 1. Rheumatoid arthritis involving multiple sites with positive rheumatoid factor (HCC) -on multiple agents:  Plaquenil, arava, prednisone  2. Weakness of right lower extremity -continue PT, OT here  3. Chronic combined systolic and diastolic CHF (congestive heart failure) ([place]) -probably needs to be on her entresto and coreg 25mg  po bid and lasix 40mg  po bid -should be on weight protocol for here -need med clarification from last facility  4. Idiopathic chronic gout of multiple sites with tophus -had been on allopurinol 100mg  daily but not on fl2  5. Spinal stenosis of lumbar region without neurogenic claudication -continue tramadol for pain  6. History of CVA (cerebrovascular accident) -with RLE weakness, wants to keep working with PT to get stronger and walk with her walker  7. Chronic pulmonary embolism with acute cor pulmonale, unspecified pulmonary embolism type (HCC) -has tessalon perles but no inhalers or nebs--looks like she should be on her albuterol q6h prn  8. Deep vein thrombosis (DVT) of distal vein of right lower extremity, unspecified chronicity (HCC) -cont xarelto therapy  9. Hypertensive heart disease with heart failure ([place]) -appears she was on entresto, coreg, hydralazine, but these were not on her FL2 which was the most up to date med list--it had written see the second pg for rest of meds, but it was identifical to the first pg--need to clarify with last facility  10. Hyperlipidemia associated with type 2 diabetes mellitus ([place]), refuses statin -notable for statin refusal not on meds for this  11. Type 2 diabetes mellitus with diabetic nephropathy, without long-term current use of insulin (HCC) -control has been good, cont current regimen with januvia and monitor Lab  Results  Component Value Date   HGBA1C 5.9 (H) 06/25/2018    12. Full code status Full code, had MOST--need copy and may need to update - Full code  Also was previously on vitamin D 1000 units daily, CBGs which could be weekly, colace, ensure,gabapentin 100mg  tid, hydralazine 25mg  po tid  Family/ staff Communication: d/w pt  Labs/tests ordered:  Will need cbc with diff, cmp, flp, hba1c   Zalman Hull L. Beautiful [place], D.O. [facility] [address], [place] [address] Cell Phone (Mon-Fri 8am-5pm):  [phone] On Call:  [phone] & follow prompts after  5pm & weekends Office Phone:  [phone] Office Fax:  [phone]

## 2020-08-28 DIAGNOSIS — M6281 Muscle weakness (generalized): Secondary | ICD-10-CM | POA: Diagnosis not present

## 2020-08-28 DIAGNOSIS — R2681 Unsteadiness on feet: Secondary | ICD-10-CM | POA: Diagnosis not present

## 2020-08-28 DIAGNOSIS — I5042 Chronic combined systolic (congestive) and diastolic (congestive) heart failure: Secondary | ICD-10-CM | POA: Diagnosis not present

## 2020-08-29 DIAGNOSIS — I5020 Unspecified systolic (congestive) heart failure: Secondary | ICD-10-CM | POA: Diagnosis not present

## 2020-08-29 DIAGNOSIS — I5042 Chronic combined systolic (congestive) and diastolic (congestive) heart failure: Secondary | ICD-10-CM | POA: Diagnosis not present

## 2020-08-29 DIAGNOSIS — I10 Essential (primary) hypertension: Secondary | ICD-10-CM | POA: Diagnosis not present

## 2020-08-29 DIAGNOSIS — M6281 Muscle weakness (generalized): Secondary | ICD-10-CM | POA: Diagnosis not present

## 2020-08-29 DIAGNOSIS — R2681 Unsteadiness on feet: Secondary | ICD-10-CM | POA: Diagnosis not present

## 2020-08-30 DIAGNOSIS — R2681 Unsteadiness on feet: Secondary | ICD-10-CM | POA: Diagnosis not present

## 2020-08-30 DIAGNOSIS — I5042 Chronic combined systolic (congestive) and diastolic (congestive) heart failure: Secondary | ICD-10-CM | POA: Diagnosis not present

## 2020-08-30 DIAGNOSIS — Z03818 Encounter for observation for suspected exposure to other biological agents ruled out: Secondary | ICD-10-CM | POA: Diagnosis not present

## 2020-08-30 DIAGNOSIS — M6281 Muscle weakness (generalized): Secondary | ICD-10-CM | POA: Diagnosis not present

## 2020-09-02 ENCOUNTER — Encounter: Payer: Self-pay | Admitting: Cardiovascular Disease

## 2020-09-02 ENCOUNTER — Ambulatory Visit (INDEPENDENT_AMBULATORY_CARE_PROVIDER_SITE_OTHER): Payer: Medicare Other | Admitting: Cardiovascular Disease

## 2020-09-02 VITALS — BP 114/68 | HR 84 | Ht 64.0 in | Wt 188.0 lb

## 2020-09-02 DIAGNOSIS — R55 Syncope and collapse: Secondary | ICD-10-CM

## 2020-09-02 DIAGNOSIS — I5042 Chronic combined systolic (congestive) and diastolic (congestive) heart failure: Secondary | ICD-10-CM | POA: Diagnosis not present

## 2020-09-02 DIAGNOSIS — I10 Essential (primary) hypertension: Secondary | ICD-10-CM

## 2020-09-02 DIAGNOSIS — I2510 Atherosclerotic heart disease of native coronary artery without angina pectoris: Secondary | ICD-10-CM

## 2020-09-02 DIAGNOSIS — Z5181 Encounter for therapeutic drug level monitoring: Secondary | ICD-10-CM | POA: Diagnosis not present

## 2020-09-02 DIAGNOSIS — I152 Hypertension secondary to endocrine disorders: Secondary | ICD-10-CM

## 2020-09-02 DIAGNOSIS — N183 Chronic kidney disease, stage 3 unspecified: Secondary | ICD-10-CM

## 2020-09-02 DIAGNOSIS — I130 Hypertensive heart and chronic kidney disease with heart failure and stage 1 through stage 4 chronic kidney disease, or unspecified chronic kidney disease: Secondary | ICD-10-CM

## 2020-09-02 DIAGNOSIS — M6281 Muscle weakness (generalized): Secondary | ICD-10-CM | POA: Diagnosis not present

## 2020-09-02 DIAGNOSIS — E1159 Type 2 diabetes mellitus with other circulatory complications: Secondary | ICD-10-CM

## 2020-09-02 DIAGNOSIS — R2681 Unsteadiness on feet: Secondary | ICD-10-CM | POA: Diagnosis not present

## 2020-09-02 NOTE — Patient Instructions (Signed)
Medication Instructions:  START HYDRALAZINE 25 MG TWICE A DAY AS NEEDED FOR SBP 140 OR HIGHER   *If you need a refill on your cardiac medications before your next appointment, please call your pharmacy*   Lab Work: FASTING LP/CMET SOON  If you have labs (blood work) drawn today and your tests are completely normal, you will receive your results only by: Marland Kitchen MyChart Message (if you have MyChart) OR . A paper copy in the mail If you have any lab test that is abnormal or we need to change your treatment, we will call you to review the results.  Testing/Procedures: NONE   Follow-Up: At [facility], you and your health needs are our priority.  As part of our continuing mission to provide you with exceptional heart care, we have created designated Provider Care Teams.  These Care Teams include your primary Cardiologist (physician) and Advanced Practice Providers (APPs -  Physician Assistants and Nurse Practitioners) who all work together to provide you with the care you need, when you need it.  We recommend signing up for the patient portal called "MyChart".  Sign up information is provided on this After Visit Summary.  MyChart is used to connect with patients for Virtual Visits (Telemedicine).  Patients are able to view lab/test results, encounter notes, upcoming appointments, etc.  Non-urgent messages can be sent to your provider as well.   To learn more about what you can do with MyChart, go to [email or web address].    Your next appointment:   6 month(s)  The format for your next appointment:   In Person  Provider:   You may see Chilton Si, MD or one of the following Advanced Practice Providers on your designated Care Team:    Corine Shelter, PA-C  [place], [place]  Edd Fabian, [place]  Other Instructions  LIMIT SODIUM TO 2 GRAMS A DAY

## 2020-09-02 NOTE — Progress Notes (Signed)
Cardiology Office Note   Date:  09/02/2020   ID:  [name] [name] [birth year], MRN [id number]  PCP:  Overton Mam, DO  Cardiologist:   Chilton Si, MD   No chief complaint on file.   History of Present Illness: [name] [name] is a 86 y.o. female with chronic systolic and diastolic heart failure, hypertension, Rheumatoid Arthritis, diabetes type 2, CAD s/p MI, and CVA who presents for follow up. She was initially seen in 2016 for management of chronic diastolic heart failure. [name] [name] was admitted to the [facility] Medicine service 11/1-11/3 with chest pain. At that time she reported constant chest pain that began in the setting of a cold. CT scan of the chest was concerning for edema. She received IV lasix and diuresed with improvement in her breathing. She was also started on oral lasix. At her initial appointment she was feeling well. She was admitted 05/2018 with weakness, chest discomfort, and shortness of breath. She was found to have pneumonia and heart failure. BNP was also elevated to 2000 and troponin was mildly elevated to 0.06. Echocardiogram 05/29/2018 revealed LVEF 20 to 25% and moderate pulmonary hypertension. She underwent left and right heart catheterization and had no obstructive CAD.  [name] [name] was admitted 06/2018 with gallstone pancreatitis. Cardiology was consulted and she was felt to be at acceptable risk for surgery. She underwent laparoscopic cholecystectomy and recovered quite well.She was discharged on Lasix 40 mg but due to confusion was only taking 20 mg. After increasing dose to 40 mg her weight returned to her baseline of 170 to 171 pounds. Losartan was switched to [facility]. She had a follow up echo 08/2018 that revealed LVEF 60 to 65% with grade 1 diastolic dysfunction. There is mild mitral stenosis with a mild to moderately calcified annulus. She was admitted 12/2018 for hand surgery and received cardiology clearance for both hand  and cervical surgery.   She was later admitted 01/2019 with encephalopathy and fever of unclear etiology.  Troponin was elevated to 0.04 and flat.  She had no chest pain.  Repeat echo 05/2019 revealed that her LVEF was again reduced to 30 to 35%.    Hydralazine was reduced due to hypotension.  In December she had COVID-19.  Lately she notes that her blood pressure has been running high.  Most recently was 187/90.  She is doing physical therapy twice weekly and is making good progress.  She thinks that this is helping her to get stronger.  At her last appointment [name] [name] was doing well.  Her BP was elevated so hydralazine was increased.  Since then she developed volume overload at her SNF.  The staff increased her lasix.  She moved back from [place] to [employer].  She has been feeling well.  Her only complaint is RA and allergies.  She notes that last week her BP was elevated.  She notes that her sodium has been high in the food.  Lately her swelling has been better.  She has no shortness of breath or chest pain.  She does therapy twice daily and feels well.  Past Medical History:  Diagnosis Date  . Allergic rhinitis due to pollen 04/27/2007  . Arthritis    "in q joint" (08/07/2013)  . CHF (congestive heart failure) (HCC)   . Coronary atherosclerosis of native coronary artery   . Cough   . Edema 05/03/2013  . Exertional shortness of breath    "sometimes" (08/07/2013)  . First degree atrioventricular  block   . Gout, unspecified 04/19/2013  . Hyperlipidemia 04/27/2007  . Hypertension   . Midline low back pain without sciatica 06/27/2014  . Muscle spasm   . Muscle weakness (generalized)   . Onychia and paronychia of toe   . Osteoarthrosis, unspecified whether generalized or localized, unspecified site 04/27/2007  . Other fall   . Other malaise and fatigue   . Pneumonia 05/2018  . Prepatellar bursitis   . Pulmonary embolism (HCC) 07/2018  . Rheumatic nodule (HCC) 06/28/2017  . Seizures  (HCC)   . Stroke (HCC) 01/17/2014  . Type II diabetes mellitus (HCC)    "fasting 90-110s" (08/07/2013)  . Unspecified vitamin D deficiency     Past Surgical History:  Procedure Laterality Date  . ABDOMINAL HYSTERECTOMY  04/1980  . APPENDECTOMY  1960  . CARDIAC CATHETERIZATION  2003  . CATARACT EXTRACTION W/ INTRAOCULAR LENS  IMPLANT, BILATERAL Bilateral ~ 2012  . CHOLECYSTECTOMY N/A 06/26/2018   Procedure: LAPAROSCOPIC CHOLECYSTECTOMY POSSIBLE INTRAOPERATIVE CHOLANGIOGRAM;  Surgeon: Abigail Miyamoto, MD;  Location: [facility] OR;  Service: General;  Laterality: N/A;  . JOINT REPLACEMENT    . REPLACEMENT TOTAL KNEE Right 09/2005  . RIGHT/LEFT HEART CATH AND CORONARY ANGIOGRAPHY N/A 06/01/2018   Procedure: RIGHT/LEFT HEART CATH AND CORONARY ANGIOGRAPHY;  Surgeon: Corky Crafts, MD;  Location: [facility] INVASIVE CV LAB;  Service: Cardiovascular;  Laterality: N/A;  . SHOULDER OPEN ROTATOR CUFF REPAIR Right 07/1999  . TONSILLECTOMY  08/1974  . TOTAL KNEE ARTHROPLASTY Left 08/07/2013   Procedure: TOTAL KNEE ARTHROPLASTY- LEFT;  Surgeon: Dannielle Huh, MD;  Location: [facility] OR;  Service: Orthopedics;  Laterality: Left;  . TRANSTHORACIC ECHOCARDIOGRAM  2003   EF 55-65%; mild concentric LVH     Current Outpatient Medications  Medication Sig Dispense Refill  . acetaminophen (TYLENOL) 500 MG tablet Take 1,000 mg by mouth 3 (three) times daily.     . benzonatate (TESSALON) 100 MG capsule Take by mouth 3 (three) times daily.    . hydrALAZINE (APRESOLINE) 25 MG tablet Take 25 mg by mouth 2 (two) times daily as needed (FOR SBP 140 OR HIGHER).    . hydroxychloroquine (PLAQUENIL) 200 MG tablet Take 1 tablet (200 mg total) by mouth 2 (two) times daily. 60 tablet 0  . isosorbide mononitrate (IMDUR) 30 MG 24 hr tablet TAKE 1 TABLET BY MOUTH EVERY DAY 90 tablet 3  . leflunomide (ARAVA) 20 MG tablet Take 20 mg by mouth daily.    . melatonin 3 MG TABS tablet Take 3 mg by mouth 2 (two) times daily.    . Olopatadine HCl  0.2 % SOLN INSTILL 1 DROP IN BOTH EYES DAILY 2.5 mL 0  . Omega 3-6-9 Fatty Acids (OMEGA 3-6-9 COMPLEX) CAPS Take 1 capsule by mouth daily. Omega 3 Black    . polyethylene glycol (MIRALAX / GLYCOLAX) 17 g packet Take 17 g by mouth daily.    . potassium chloride SA (K-DUR) 20 MEQ tablet Take 20 mEq by mouth See admin instructions. Monday and Thursday    . predniSONE (DELTASONE) 5 MG tablet Take 1.5 tablets (7.5 mg total) by mouth daily with breakfast. 135 tablet 3  . senna-docusate (SENOKOT-S) 8.6-50 MG tablet Take 1 tablet by mouth in the morning and at bedtime. At bedtime    . sitaGLIPtin (JANUVIA) 25 MG tablet Take 25 mg by mouth daily.    . traMADol (ULTRAM) 50 MG tablet Take by mouth every 8 (eight) hours as needed.    Carlena Hurl 20 MG TABS  tablet TAKE 1 TABLET(20 MG) BY MOUTH DAILY 30 tablet 0   No current facility-administered medications for this visit.    Allergies:   Clonidine derivatives, Fish allergy, Shellfish allergy, Doxycycline, Indomethacin, Lyrica [pregabalin], Methyldopa, Morphine and related, Orange fruit [citrus], Strawberry (diagnostic), Cetirizine hcl, Codeine, Levaquin [levofloxacin in d5w], and Tomato    Social History:  The patient  reports that she has never smoked. She has never used smokeless tobacco. She reports that she does not drink alcohol and does not use drugs.   Family History:  The patient's family history includes Heart attack in her father.    ROS:  Please see the history of present illness.   Otherwise, review of systems are positive for allergies.   All other systems are reviewed and negative.    PHYSICAL EXAM: VS:  BP 114/68   Pulse 84   Ht 5\' 4"  (1.626 m)   Wt 188 lb (85.3 kg)   SpO2 97%   BMI 32.27 kg/m  , BMI Body mass index is 32.27 kg/m. GENERAL:  Well appearing. No acute distress HEENT: Pupils equal round and reactive, fundi not visualized, oral mucosa unremarkable NECK:  No jugular venous distention, waveform within normal limits,  carotid upstroke brisk and symmetric, no bruits LUNGS:  Clear to auscultation bilaterally HEART:  RRR.  PMI not displaced or sustained,S1 and S2 within normal limits, no S3, no S4, no clicks, no rubs, no murmurs ABD:  Flat, positive bowel sounds normal in frequency in pitch, no bruits, no rebound, no guarding, no midline pulsatile mass, no hepatomegaly, no splenomegaly EXT:  2 plus pulses throughout, non-pitting edema, no cyanosis no clubbing SKIN:  No rashes no nodules NEURO:  Cranial nerves II through XII grossly intact, motor grossly intact throughout PSYCH:  Cognitively intact, oriented to person place and time   EKG:  EKG is ordered today. 12/14/19 (EMS): Sinus rhythm.  Rate 83 bpm.  LAFB.  LVH.  Lateral W wave abnormalities 08/29/2020: Sinus rhythm.  Rate 84 bpm.  Left axis deviation.  LVH.  LHC 06/01/18:  LV end diastolic pressure is moderately elevated.  There is no aortic valve stenosis.  Hemodynamic findings consistent with moderate pulmonary hypertension.  CO 5.68 L/min; CI 2.97; Ao sat 99%; PA sat 65%; mean PA 42 mm Hg; mean PCWP 28 mm Hg  Nonobstructive CAD.   Nonischemic cardiomyopathy with mild to moderate CAD.   Echo 05/29/18: Study Conclusions  - Left ventricle: The cavity size was normal. Wall thickness was   increased in a pattern of moderate LVH. Systolic function was   severely reduced. The estimated ejection fraction was in the   range of 20% to 25%. Although no diagnostic regional wall motion   abnormality was identified, this possibility cannot be completely   excluded on the basis of this study. - Aortic valve: Mildly calcified annulus. Trileaflet; normal   thickness, moderately calcified leaflets. There was mild   regurgitation. - Mitral valve: Mildly calcified annulus. Mildly calcified leaflets   . There was mild to moderate regurgitation. - Left atrium: The atrium was moderately to severely dilated. - Right ventricle: The cavity size was mildly  dilated. Wall   thickness was normal. Systolic function was mildly reduced. - Right atrium: The atrium was moderately dilated. - Atrial septum: There was a small patent foramen ovale. There was   a left-to-right shunt. - Tricuspid valve: There was moderate regurgitation. - Pulmonary arteries: Systolic pressure was moderately increased.   PA peak pressure: 47 mm Hg (  S). - Pericardium, extracardiac: A trivial pericardial effusion was   identified.  Echo 01/18/14: Study Conclusions  - Left ventricle: The cavity size was normal. Wall thickness was increased in a pattern of moderate to severe LVH. There was moderate focal basal hypertrophy of the septum. Systolic function was normal. The estimated ejection fraction was in the range of 60% to 65%. Wall motion was normal; there were no regional wall motion abnormalities. Doppler parameters are consistent with abnormal left ventricular relaxation (grade 1 diastolic dysfunction). - Left atrium: The atrium was moderately dilated. - Pulmonary arteries: Systolic pressure was mildly increased.  Carotid ultrasound 01/18/14: Summary: Bilateral: intimal wall thickening CCA. 1-39% ICA stenosis. Vertebral artery flow is antegrade.   Recent Labs: 10/29/2019: ALT 11 12/12/2019: B Natriuretic Peptide 196.6 12/20/2019: BUN 24; Creatinine, Ser 0.85; Hemoglobin 9.8; Platelets 279.0; Potassium 3.9; Pro B Natriuretic peptide (BNP) 460.0; Sodium 139    Lipid Panel    Component Value Date/Time   CHOL 202 (H) 06/25/2018 0607   CHOL 185 03/04/2016 1620   TRIG 67 06/25/2018 0607   HDL 63 06/25/2018 0607   HDL 73 03/04/2016 1620   CHOLHDL 3.2 06/25/2018 0607   VLDL 13 06/25/2018 0607   LDLCALC 126 (H) 06/25/2018 0607   LDLCALC 89 03/04/2016 1620      Wt Readings from Last 3 Encounters:  09/02/20 188 lb (85.3 kg)  08/26/20 180 lb (81.6 kg)  01/30/20 180 lb (81.6 kg)      ASSESSMENT AND PLAN:  # Chronic systolic and diastolic  heart failure: # Hypertension:  Systolic function normalized and then was 30-35% on echo 05/2019. She is euvolemic and has no heart failure symptoms. Her systolic function has fluctuated quite a bit.  Clinically she does not have heart failure at this time.  She is developed hypotension in the past we tried to keep her on antihypertensives.  However she does have fluctuating blood pressures.  We will give hydralazine 25 mg twice daily to be taken if her systolic blood pressure is over 140.  Otherwise she will not be on any antihypertensives.  She gets her blood pressure checked twice daily.    # Near syncope: Resolved.  Episode occurred in the setting of abdominal pain and defecation.  This seems to have been an episode of vasovagal syncope.  She has no recurrent symptoms and is otherwise doing well.  No further evaluation at this time.  # Chest pain:Resolved. No CAD on cath.  # CAD s/p MI:[name] [name] reportedly had an MI in the 85s. She had minimal, non-obstructive disease on cath 05/2018.  Carvedilol stopped 2/2 hypotension.  We will asked that she have lipids and a CMP checked at her facility.  # PE: [name] [name] had a PE 07/2018 soon after having pneumonia. She is on Xarelto.  Given that she remains inactive and requires a Hoyer lift to move, continue indefinitely.   Current medicines are reviewed at length with the patient today.  The patient does not have concerns regarding medicines.  The following changes have been made: hydralazine prn Labs/ tests ordered today include:   Orders Placed This Encounter  Procedures  . Comprehensive metabolic panel  . Lipid panel  . EKG 12-Lead     Disposition:   FU with Jeyla Bulger C. Duke Salvia, MD, [facility] in 6 months   Signed, Ranika Mcniel C. Duke Salvia, MD, [facility]  09/02/2020 2:32 PM    [facility]

## 2020-09-03 ENCOUNTER — Telehealth: Payer: Self-pay | Admitting: Family Medicine

## 2020-09-03 DIAGNOSIS — M6281 Muscle weakness (generalized): Secondary | ICD-10-CM | POA: Diagnosis not present

## 2020-09-03 DIAGNOSIS — I5042 Chronic combined systolic (congestive) and diastolic (congestive) heart failure: Secondary | ICD-10-CM | POA: Diagnosis not present

## 2020-09-03 DIAGNOSIS — R2681 Unsteadiness on feet: Secondary | ICD-10-CM | POA: Diagnosis not present

## 2020-09-03 NOTE — Telephone Encounter (Signed)
Patient dropped off forms from [employer] to be completed by provider. Placed in providers folder for pick up.

## 2020-09-04 DIAGNOSIS — M6281 Muscle weakness (generalized): Secondary | ICD-10-CM | POA: Diagnosis not present

## 2020-09-04 DIAGNOSIS — I5042 Chronic combined systolic (congestive) and diastolic (congestive) heart failure: Secondary | ICD-10-CM | POA: Diagnosis not present

## 2020-09-04 DIAGNOSIS — R2681 Unsteadiness on feet: Secondary | ICD-10-CM | POA: Diagnosis not present

## 2020-09-04 NOTE — Telephone Encounter (Signed)
Forms received and on Dr. C's desk. °

## 2020-09-05 DIAGNOSIS — I5042 Chronic combined systolic (congestive) and diastolic (congestive) heart failure: Secondary | ICD-10-CM | POA: Diagnosis not present

## 2020-09-05 DIAGNOSIS — R2681 Unsteadiness on feet: Secondary | ICD-10-CM | POA: Diagnosis not present

## 2020-09-05 DIAGNOSIS — M6281 Muscle weakness (generalized): Secondary | ICD-10-CM | POA: Diagnosis not present

## 2020-09-06 DIAGNOSIS — R2681 Unsteadiness on feet: Secondary | ICD-10-CM | POA: Diagnosis not present

## 2020-09-06 DIAGNOSIS — M6281 Muscle weakness (generalized): Secondary | ICD-10-CM | POA: Diagnosis not present

## 2020-09-06 DIAGNOSIS — I5042 Chronic combined systolic (congestive) and diastolic (congestive) heart failure: Secondary | ICD-10-CM | POA: Diagnosis not present

## 2020-09-10 ENCOUNTER — Non-Acute Institutional Stay (SKILLED_NURSING_FACILITY): Payer: Medicare Other | Admitting: Internal Medicine

## 2020-09-10 ENCOUNTER — Encounter: Payer: Self-pay | Admitting: Internal Medicine

## 2020-09-10 ENCOUNTER — Telehealth: Payer: Self-pay | Admitting: Physician Assistant

## 2020-09-10 DIAGNOSIS — I5043 Acute on chronic combined systolic (congestive) and diastolic (congestive) heart failure: Secondary | ICD-10-CM

## 2020-09-10 NOTE — Progress Notes (Signed)
Location:  [facility] Room Number: 301 D Place of Service:  SNF (31) Provider:  Sadaf Przybysz L. Renato Gails, D.O., C.[name]  Kermit Balo, DO  Patient Care Team: Kermit Balo, DO as PCP - General (Geriatric Medicine) Chilton Si, MD as PCP - Cardiology (Cardiology) Arlean Hopping, MD as Rounding Team (Internal Medicine) Casimer Lanius, MD as Consulting Physician (Rheumatology)  Extended Emergency Contact Information Primary Emergency Contact: Hampton,Jeannette Address: [address]          [place], [place] [address] [place] of [place] Phone: [phone] Relation: Daughter Secondary Emergency Contact: [name] Address: [address]          [place], [place] [address] Darden [place] of [place] Mobile Phone: [phone] Relation: Daughter  Code Status:  FULL CODE Goals of care: Advanced Directive information Advanced Directives 09/10/2020  Does Patient Have a Medical Advance Directive? No  Type of Advance Directive -  Does patient want to make changes to medical advance directive? No - Patient declined  Copy of Healthcare Power of Attorney in Chart? -  Would patient like information on creating a medical advance directive? -     Chief Complaint  Patient presents with  . Acute Visit    acute visit for weight gain    HPI:  Pt is a 86 y.o. female seen today for an acute visit for 7 lb wt gain since admission.  Of note, she had missed several of her medications during the first week of admission b/c her FL2 had not been faxed in its entirety to her new facility from the old one.  Those meds were initiated later including her entresto, lasix 40mg  po daily and potassium.  No doubt this is why she has now had weight gain.    Past Medical History:  Diagnosis Date  . Allergic rhinitis due to pollen 04/27/2007  . Arthritis    "in q joint" (08/07/2013)  . CHF (congestive heart failure) (HCC)   . Coronary atherosclerosis  of native coronary artery   . Cough   . Edema 05/03/2013  . Exertional shortness of breath    "sometimes" (08/07/2013)  . First degree atrioventricular block   . Gout, unspecified 04/19/2013  . Hyperlipidemia 04/27/2007  . Hypertension   . Midline low back pain without sciatica 06/27/2014  . Muscle spasm   . Muscle weakness (generalized)   . Onychia and paronychia of toe   . Osteoarthrosis, unspecified whether generalized or localized, unspecified site 04/27/2007  . Other fall   . Other malaise and fatigue   . Pneumonia 05/2018  . Prepatellar bursitis   . Pulmonary embolism (HCC) 07/2018  . Rheumatic nodule (HCC) 06/28/2017  . Seizures (HCC)   . Stroke (HCC) 01/17/2014  . Type II diabetes mellitus (HCC)    "fasting 90-110s" (08/07/2013)  . Unspecified vitamin D deficiency    Past Surgical History:  Procedure Laterality Date  . ABDOMINAL HYSTERECTOMY  04/1980  . APPENDECTOMY  1960  . CARDIAC CATHETERIZATION  2003  . CATARACT EXTRACTION W/ INTRAOCULAR LENS  IMPLANT, BILATERAL Bilateral ~ 2012  . CHOLECYSTECTOMY N/A 06/26/2018   Procedure: LAPAROSCOPIC CHOLECYSTECTOMY POSSIBLE INTRAOPERATIVE CHOLANGIOGRAM;  Surgeon: Abigail Miyamoto, MD;  Location: [facility] OR;  Service: General;  Laterality: N/A;  . JOINT REPLACEMENT    . REPLACEMENT TOTAL KNEE Right 09/2005  . RIGHT/LEFT HEART CATH AND CORONARY ANGIOGRAPHY N/A 06/01/2018   Procedure: RIGHT/LEFT HEART CATH AND CORONARY ANGIOGRAPHY;  Surgeon: Corky Crafts, MD;  Location:  [facility] INVASIVE CV LAB;  Service: Cardiovascular;  Laterality: N/A;  . SHOULDER OPEN ROTATOR CUFF REPAIR Right 07/1999  . TONSILLECTOMY  08/1974  . TOTAL KNEE ARTHROPLASTY Left 08/07/2013   Procedure: TOTAL KNEE ARTHROPLASTY- LEFT;  Surgeon: Dannielle Huh, MD;  Location: [facility] OR;  Service: Orthopedics;  Laterality: Left;  . TRANSTHORACIC ECHOCARDIOGRAM  2003   EF 55-65%; mild concentric LVH    Allergies  Allergen Reactions  . Clonidine Derivatives Swelling     [name]  . Fish Allergy Diarrhea, Swelling and Other (See Comments)    Turns skin "black," but can tolerate white fish Salmon- Diarrhea  . Shellfish Allergy Hives  . Doxycycline Rash  . Indomethacin Other (See Comments)    Reaction not recalled by the patient  . Lyrica [Pregabalin] Other (See Comments)    Hallucinations   . Methyldopa Other (See Comments)    Aldomet (for hypertension): Reaction not recalled by the patient  . Morphine And Related Other (See Comments)    Family reports it drops her bp that she needs iv fluids  . Orange Fruit [Citrus] Other (See Comments)    Indigestion/heartburn  . Strawberry (Diagnostic) Itching  . Cetirizine Hcl Itching and Rash  . Codeine Itching  . Levaquin [Levofloxacin In D5w] Rash  . Tomato Rash    Outpatient Encounter Medications as of 09/10/2020  Medication Sig  . acetaminophen (TYLENOL) 500 MG tablet Take 1,000 mg by mouth 3 (three) times daily.   Marland Kitchen albuterol (VENTOLIN HFA) 108 (90 Base) MCG/ACT inhaler Inhale 2 puffs into the lungs every 6 (six) hours as needed for wheezing or shortness of breath.  . allopurinol (ZYLOPRIM) 100 MG tablet Take by mouth daily.  Marland Kitchen AMLODIPINE BESYLATE PO Take 5 mg by mouth.  . benzonatate (TESSALON) 100 MG capsule Take by mouth 3 (three) times daily.  . carvedilol (COREG) 25 MG tablet Take 25 mg by mouth 2 (two) times daily with a meal.  . Cholecalciferol (VITAMIN D3 PO) Take 2,000 Units by mouth.  . fluticasone (FLONASE ALLERGY RELIEF) 50 MCG/ACT nasal spray Place into both nostrils daily.  . furosemide (LASIX) 40 MG tablet Take 40 mg by mouth daily.  Marland Kitchen gabapentin (NEURONTIN) 100 MG capsule Take 100 mg by mouth 3 (three) times daily.  . hydrALAZINE (APRESOLINE) 25 MG tablet Take 25 mg by mouth 2 (two) times daily as needed (FOR SBP 140 OR HIGHER).  . hydroxychloroquine (PLAQUENIL) 200 MG tablet Take 1 tablet (200 mg total) by mouth 2  (two) times daily.  . isosorbide mononitrate (IMDUR) 30 MG 24 hr tablet TAKE 1 TABLET BY MOUTH EVERY DAY  . Lactobacillus (ACIDOPHILUS PO) Take by mouth.  . leflunomide (ARAVA) 20 MG tablet Take 20 mg by mouth daily.  Marland Kitchen loratadine (CLARITIN) 10 MG tablet Take 10 mg by mouth daily.  . melatonin 3 MG TABS tablet Take 3 mg by mouth 2 (two) times daily.  . Menthol, Topical Analgesic, (BIOFREEZE) 4 % GEL Apply topically.  . Olopatadine HCl 0.2 % SOLN INSTILL 1 DROP IN BOTH EYES DAILY  . Omega 3-6-9 Fatty Acids (OMEGA 3-6-9 COMPLEX) CAPS Take 1 capsule by mouth daily. Omega 3 Black  . polyethylene glycol (MIRALAX / GLYCOLAX) 17 g packet Take 17 g by mouth daily.  . potassium chloride SA (K-DUR) 20 MEQ tablet Take 20 mEq by mouth See admin instructions. Monday and Thursday  . predniSONE (DELTASONE) 5 MG tablet Take 1.5 tablets (7.5 mg total) by  mouth daily with breakfast.  . Sacubitril-Valsartan (ENTRESTO PO) Take 49 mg by mouth.  . senna-docusate (SENOKOT-S) 8.6-50 MG tablet Take 1 tablet by mouth in the morning and at bedtime. At bedtime  . sitaGLIPtin (JANUVIA) 25 MG tablet Take 25 mg by mouth daily.  Carlena Hurl 20 MG TABS tablet TAKE 1 TABLET(20 MG) BY MOUTH DAILY  . [DISCONTINUED] traMADol (ULTRAM) 50 MG tablet Take by mouth every 8 (eight) hours as needed.   No facility-administered encounter medications on file as of 09/10/2020.    Review of Systems  Constitutional: Negative for chills, diaphoresis, fever, malaise/fatigue and weight loss.  Respiratory: Negative for cough and shortness of breath.   Cardiovascular: Positive for leg swelling. Negative for chest pain and palpitations.  Genitourinary: Negative for dysuria, frequency and urgency.    Immunization History  Administered Date(s) Administered  . Influenza Whole 09/06/2012  . Influenza, High Dose Seasonal PF 09/13/2017  . Influenza,inj,Quad PF,6+ Mos 09/12/2013, 09/11/2014, 07/23/2015, 09/16/2016, 09/07/2018  . PFIZER SARS-COV-2  Vaccination 01/04/2020, 01/24/2020  . Pneumococcal Polysaccharide-23 04/13/2005, 11/09/2009, 09/12/2015  . Td 04/13/2005  . Zoster 11/10/2011   Pertinent  Health Maintenance Due  Topic Date Due  . PNA vac Low Risk Adult (2 of 2 - PCV13) 09/11/2016  . FOOT EXAM  08/30/2019  . HEMOGLOBIN A1C  10/13/2019  . INFLUENZA VACCINE  06/09/2020  . OPHTHALMOLOGY EXAM  09/07/2020  . DEXA SCAN  Completed   Fall Risk  05/02/2019 08/19/2018 06/28/2017 03/29/2017 12/08/2016  Falls in the past year? 1 Yes No Yes No  Number falls in past yr: 1 1 - 2 or more -  Injury with Fall? 1 - - Yes -  Risk Factor Category  - - - High Fall Risk -  Risk for fall due to : History of fall(s);Impaired balance/gait;Impaired mobility Other (Comment) - History of fall(s);Impaired balance/gait;Impaired mobility;Mental status change -  Risk for fall due to: Comment - Syncope related to pulmonary embolism - - -  Follow up Education provided;Falls prevention discussed Falls prevention discussed - Education provided;Falls prevention discussed -   Functional Status Survey:    Vitals:   09/10/20 1621  BP: 138/77  Pulse: (!) 103  Temp: (!) 97.5 F (36.4 C)  Weight: 188 lb (85.3 kg)  Height: 5\' 4"  (1.626 m)   Body mass index is 32.27 kg/m. Physical Exam Vitals reviewed.  Constitutional:      Appearance: Normal appearance.  Cardiovascular:     Rate and Rhythm: Normal rate and regular rhythm.  Pulmonary:     Effort: Pulmonary effort is normal.     Breath sounds: Normal breath sounds. No rales.  Abdominal:     General: Bowel sounds are normal.  Musculoskeletal:     Right lower leg: Edema present.     Left lower leg: Edema present.  Neurological:     Mental Status: She is alert. Mental status is at baseline.  Psychiatric:        Mood and Affect: Mood normal.     Labs reviewed: Recent Labs    12/13/19 0447 12/13/19 1228 12/20/19 1344  NA 140 138 139  K 3.9 4.0 3.9  CL 103 104 104  CO2 26 26 28   GLUCOSE  96 99 94  BUN 37* 35* 24*  CREATININE 1.26* 1.16* 0.85  CALCIUM 8.9 8.6* 9.1   Recent Labs    09/24/19 2009 10/29/19 2252  AST 14* 17  ALT 10 11  ALKPHOS 50 49  BILITOT 0.8 0.1*  PROT  6.3* 6.0*  ALBUMIN 3.4* 2.9*   Recent Labs    09/13/19 1018 09/24/19 2017 12/12/19 2322 12/13/19 0447 12/20/19 1344  WBC 6.6   < > 9.2 8.8 7.2  NEUTROABS 3.8  --  6.7  --   --   HGB 8.3*   < > 10.5* 10.6* 9.8*  HCT 25.8*   < > 32.2* 33.7* 29.8*  MCV 100.8*   < > 101.6* 101.5* 97.4  PLT 294   < > 263 251 279.0   < > = values in this interval not displayed.   Lab Results  Component Value Date   TSH 1.521 03/29/2019   Lab Results  Component Value Date   HGBA1C 5.9 (H) 06/25/2018   Lab Results  Component Value Date   CHOL 202 (H) 06/25/2018   HDL 63 06/25/2018   LDLCALC 126 (H) 06/25/2018   TRIG 67 06/25/2018   CHOLHDL 3.2 06/25/2018    Assessment/Plan 1. Acute on chronic combined systolic and diastolic heart failure (HCC) -is back on her diuretics and heart failure medications now -will double up lasix for 3 days--40mg  in am and 40mg  at noon, then return to daily dose -cont weights and if still trending up, I will need to be notified -BMP at one week  Meghna Hagmann L. Jenniah Bhavsar, D.O. Geriatrics [facility] [address], [place] [address] Cell Phone (Mon-Fri 8am-5pm):  [phone] On Call:  [phone] & follow prompts after 5pm & weekends Office Phone:  [phone] Office Fax:  [phone]

## 2020-09-10 NOTE — Telephone Encounter (Signed)
   Ms. Rolley Sims called because her mother had called her.  Ms. Pizzini called her daughter because Ms. Berne had noticed that she was having swelling in her legs and her weight had increased.  Her weight is currently 190 pounds.  In reviewing the chart, there was a visit today from the caregiver, but the note is incomplete and I am not able to see what the plan is.  I advised the daughter that I would try to contact the facility and see if I can get someone to let me know if this is being treated.  I was able to reassure her daughter that Ms. Redner weight is only 2 pounds higher than the 188 pounds she weighed when seen in the office.  However, on 08/26/2020, she was only 180 pounds, so may have some extra fluid on board.  I contacted [facility] and was able to speak to a staff member.  They advised me that she was put on Lasix 40 mg twice daily for 3 days and then Lasix 40 mg daily, BMET in a week.  I requested that the nurse call the daughter and let her know that this was the plan.  It will greatly reassure her daughter to note that the swelling is being treated.  Theodore Demark, PA-C 09/10/2020 7:33 PM

## 2020-09-12 DIAGNOSIS — Z0279 Encounter for issue of other medical certificate: Secondary | ICD-10-CM

## 2020-11-05 ENCOUNTER — Encounter: Payer: Self-pay | Admitting: Family Medicine

## 2020-11-20 ENCOUNTER — Telehealth: Payer: Self-pay

## 2020-11-20 NOTE — Telephone Encounter (Signed)
Volunteer called patient on behalf of Palliative Care and did not get a answer from patient/family. ° °

## 2020-12-27 ENCOUNTER — Other Ambulatory Visit: Payer: Self-pay | Admitting: Family Medicine

## 2020-12-27 DIAGNOSIS — I420 Dilated cardiomyopathy: Secondary | ICD-10-CM

## 2020-12-27 DIAGNOSIS — I5042 Chronic combined systolic (congestive) and diastolic (congestive) heart failure: Secondary | ICD-10-CM

## 2020-12-27 DIAGNOSIS — R29898 Other symptoms and signs involving the musculoskeletal system: Secondary | ICD-10-CM

## 2020-12-27 DIAGNOSIS — M48061 Spinal stenosis, lumbar region without neurogenic claudication: Secondary | ICD-10-CM

## 2020-12-30 ENCOUNTER — Encounter: Payer: Self-pay | Admitting: Internal Medicine

## 2021-01-28 ENCOUNTER — Other Ambulatory Visit: Payer: Self-pay

## 2021-01-28 ENCOUNTER — Non-Acute Institutional Stay: Payer: Medicare Other | Admitting: Student

## 2021-01-28 DIAGNOSIS — Z515 Encounter for palliative care: Secondary | ICD-10-CM

## 2021-01-28 NOTE — Progress Notes (Signed)
[facility] Consult Note Telephone: [phone]  Fax: [phone]  PATIENT NAME: [name] [name] [address] [place] [address] [phone] (home)  DOB: 1929/11/19 MRN: [id number]  PRIMARY CARE PROVIDER:    Gayland Curry, DO,  [place] [address] [phone]  REFERRING PROVIDER:   Gayland Curry, DO [place],  [place] [address] [phone]  RESPONSIBLE PARTY:   Extended Emergency Contact Information Primary Emergency Contact: [name] [name] Address: [address]          [place], [place] [address] Johnnette Litter of [place] Phone: [phone] Relation: Daughter Secondary Emergency Contact: [name] [name] Address: [place],  [address] [place] Mobile Phone: [phone] Relation: Daughter  I met face to face with patient in nursing facility.  ASSESSMENT AND RECOMMENDATIONS:   Advance Care Planning: Visit at the request of [name] [name] for palliative consult. Visit consisted of building trust and discussions on Palliative care medicine as specialized medical care for people living with serious illness, aimed at facilitating improved quality of life through symptoms relief, assisting with advance care planning and establishing goals of care. Family expressed appreciation for education provided on Palliative care and how it differs from Hospice service. Palliative care will continue to provide support to patient, family and the medical team.  Discussed with [name] via phone; she would like for patient to return home with additional support in the home. Discussed equipment needs, incontinent care. [name] [name] is going to check on hoyer lift with hole for toileting and purewick system.  Goal of care: Patient would like to return home.   Directives: MOST form.  Symptom Management:   Pain-patient with osteoarthritis.  Recommend continuing acetaminophen, gabapentin, biofreeze as directed. Continue OT as directed.   Combined systolic/diastolic heart failure-patient with lower extremity edema; denies any other symptoms. Recommend continuing furosemide 61m daily and HF medications as directed, continue weights, elevate legs while in bed, monitor sodium/salt intake in diet.    Weakness-Patient with lower extremity weakness, non-ambulatory. Recommend continuing OT as able to tolerate, mechanical lift for transfers.   Follow up Palliative Care Visit: Palliative care will continue to follow for complex decision making and symptom management. Return in 4 weeks or prn.  Family /Caregiver/Community Supports: Palliative Medicine will continue to provide support.   I spent 35 minutes providing this consultation, time includes time spent with patient/family, chart review, provider coordination, and documentation. More than 50% of the time in this consultation was spent counseling and coordinating communication.   CHIEF COMPLAINT: Palliative Medicine follow up visit.   History obtained from review of EMR, discussion with primary team, and  interview with family. Records reviewed and summarized below.  HISTORY OF PRESENT ILLNESS:  [name] SASAKIis a 87y.o. year old female with multiple medical problems including acute on chronic combined systolic and diastolic heart failure, CAD, CKD, stroke, T2DM, edema, hyperlipidemia, hypertension, OA. Palliative Care was asked to follow this patient by consultation request of RGayland Curry DO to help address advance care planning and goals of care. This is a follow up visit.  Patient reports doing well. She requires assist for all adl's. She is currently receiving OT per patient and staff. She would like to start working on LE strengthening in hopes to ambulate again. She is out of bed daily to wheel chair. She reports arthritis type pain to  hands, lower extremities; current  regimen effective for pain. Denies any shortness of breath, chest pain, cough, nausea, constipation. She endorses a good appetite. Sleeping okay at night; does report being a "night owl." No recent hospitalizations.   CODE STATUS: DNR  PPS: 40%  HOSPICE ELIGIBILITY/DIAGNOSIS: TBD  ROS   General: NAD EYES: denies vision changes ENMT: denies dysphagia Cardiovascular: denies chest pain Pulmonary: denies cough, denies increased SOB Abdomen: endorses fair appetite, incontinent of bowel GU: denies dysuria,  incontinence of urine MSK:  endorses ROM limitations, no falls reported Skin: denies rashes or wounds Neurological: endorses weakness Psych: Endorses positive mood Heme/lymph/immuno: denies bruises, abnormal bleeding   Physical Exam: Pulse 80, resp 20, b/p 110/64, sats 95% on room air Constitutional: NAD General: A & O x 3, well nourished EYES: anicteric sclera, lids intact, no discharge  ENMT: intact hearing,oral mucous membranes moist CV: RRR, 1+ LE edema Pulmonary: LCTA, no increased work of breathing, no cough Abdomen: bowel sounds normoactive x 4 GU: deferred MSK: decreased ROM in all extremities, non ambulatory Skin: warm and dry, no rashes or wounds on visible skin Neuro: Generalized weakness Psych: pleasant, non-anxious affect today Hem/lymph/immuno: no widespread bruising   PAST MEDICAL HISTORY:  Past Medical History:  Diagnosis Date  . Allergic rhinitis due to pollen 04/27/2007  . Arthritis    "in q joint" (08/07/2013)  . CHF (congestive heart failure) ([place])   . Coronary atherosclerosis of native coronary artery   . Cough   . Edema 05/03/2013  . Exertional shortness of breath    "sometimes" (08/07/2013)  . First degree atrioventricular block   . Gout, unspecified 04/19/2013  . Hyperlipidemia 04/27/2007  . Hypertension   . Midline low back pain without sciatica 06/27/2014  . Muscle spasm   . Muscle weakness (generalized)   . Onychia and paronychia of toe    . Osteoarthrosis, unspecified whether generalized or localized, unspecified site 04/27/2007  . Other fall   . Other malaise and fatigue   . Pneumonia 05/2018  . Prepatellar bursitis   . Pulmonary embolism ([place]) 07/2018  . Rheumatic nodule ([place]) 06/28/2017  . Seizures ([place])   . Stroke ([place]) 01/17/2014  . Type II diabetes mellitus ([place])    "fasting 90-110s" (08/07/2013)  . Unspecified vitamin D deficiency     SOCIAL HX:  Social History   Tobacco Use  . Smoking status: Never Smoker  . Smokeless tobacco: Never Used  Substance Use Topics  . Alcohol use: No    Alcohol/week: 0.0 standard drinks   FAMILY HX:  Family History  Problem Relation Age of Onset  . Heart attack Father     ALLERGIES:  Allergies  Allergen Reactions  . Clonidine Derivatives Swelling    [name]  . Fish Allergy Diarrhea, Swelling and Other (See Comments)    Turns skin "black," but can tolerate white fish Salmon- Diarrhea  . Shellfish Allergy Hives  . Doxycycline Rash  . Indomethacin Other (See Comments)    Reaction not recalled by the patient  . Lyrica [Pregabalin] Other (See Comments)    Hallucinations   . Methyldopa Other (See Comments)    Aldomet (for hypertension): Reaction not recalled by the patient  . Morphine And Related Other (See Comments)    Family reports it drops her bp that she needs iv fluids  . Orange Fruit [Citrus] Other (See Comments)    Indigestion/heartburn  . Strawberry (Diagnostic) Itching  . Cetirizine Hcl Itching and  Rash  . Codeine Itching  . Levaquin [Levofloxacin In D5w] Rash  . Tomato Rash     PERTINENT MEDICATIONS:  Outpatient Encounter Medications as of 01/28/2021  Medication Sig  . acetaminophen (TYLENOL) 500 MG tablet Take 1,000 mg by mouth 3 (three) times daily.   Marland Kitchen albuterol (VENTOLIN HFA) 108 (90 Base) MCG/ACT inhaler Inhale 2 puffs into the lungs every 6 (six) hours as needed for wheezing  or shortness of breath.  . allopurinol (ZYLOPRIM) 100 MG tablet Take by mouth daily.  Marland Kitchen AMLODIPINE BESYLATE PO Take 5 mg by mouth.  . benzonatate (TESSALON) 100 MG capsule Take by mouth 3 (three) times daily.  . carvedilol (COREG) 25 MG tablet Take 25 mg by mouth 2 (two) times daily with a meal.  . Cholecalciferol (VITAMIN D3 PO) Take 2,000 Units by mouth.  . fluticasone (FLONASE ALLERGY RELIEF) 50 MCG/ACT nasal spray Place into both nostrils daily.  . furosemide (LASIX) 40 MG tablet Take 40 mg by mouth daily.  Marland Kitchen gabapentin (NEURONTIN) 100 MG capsule Take 100 mg by mouth 3 (three) times daily.  . hydrALAZINE (APRESOLINE) 25 MG tablet Take 25 mg by mouth 2 (two) times daily as needed (FOR SBP 140 OR HIGHER).  . hydroxychloroquine (PLAQUENIL) 200 MG tablet Take 1 tablet (200 mg total) by mouth 2 (two) times daily.  . isosorbide mononitrate (IMDUR) 30 MG 24 hr tablet TAKE 1 TABLET BY MOUTH EVERY DAY  . Lactobacillus (ACIDOPHILUS PO) Take by mouth.  . leflunomide (ARAVA) 20 MG tablet Take 20 mg by mouth daily.  Marland Kitchen loratadine (CLARITIN) 10 MG tablet Take 10 mg by mouth daily.  . melatonin 3 MG TABS tablet Take 3 mg by mouth 2 (two) times daily.  . Menthol, Topical Analgesic, (BIOFREEZE) 4 % GEL Apply topically.  . Olopatadine HCl 0.2 % SOLN INSTILL 1 DROP IN BOTH EYES DAILY  . Omega 3-6-9 Fatty Acids (OMEGA 3-6-9 COMPLEX) CAPS Take 1 capsule by mouth daily. Omega 3 Black  . polyethylene glycol (MIRALAX / GLYCOLAX) 17 g packet Take 17 g by mouth daily.  . potassium chloride SA (K-DUR) 20 MEQ tablet Take 20 mEq by mouth See admin instructions. Monday and Thursday  . predniSONE (DELTASONE) 5 MG tablet Take 1.5 tablets (7.5 mg total) by mouth daily with breakfast.  . Sacubitril-Valsartan (ENTRESTO PO) Take 49 mg by mouth.  . senna-docusate (SENOKOT-S) 8.6-50 MG tablet Take 1 tablet by mouth in the morning and at bedtime. At bedtime  . sitaGLIPtin (JANUVIA) 25 MG tablet Take 25 mg by mouth daily.  Alveda Reasons 20 MG TABS tablet TAKE 1 TABLET(20 MG) BY MOUTH DAILY   No facility-administered encounter medications on file as of 01/28/2021.     Thank you for the opportunity to participate in the care of Ms. Pries. The palliative care team will continue to follow. Please call our office at [phone] if we can be of additional assistance.  Ezekiel Slocumb, NP

## 2021-02-05 ENCOUNTER — Other Ambulatory Visit: Payer: Self-pay | Admitting: Family Medicine

## 2021-02-05 DIAGNOSIS — R29898 Other symptoms and signs involving the musculoskeletal system: Secondary | ICD-10-CM

## 2021-02-05 DIAGNOSIS — I5042 Chronic combined systolic (congestive) and diastolic (congestive) heart failure: Secondary | ICD-10-CM

## 2021-02-05 DIAGNOSIS — M069 Rheumatoid arthritis, unspecified: Secondary | ICD-10-CM

## 2021-02-05 DIAGNOSIS — M48061 Spinal stenosis, lumbar region without neurogenic claudication: Secondary | ICD-10-CM

## 2021-03-26 ENCOUNTER — Encounter (HOSPITAL_COMMUNITY): Payer: Self-pay

## 2021-03-26 ENCOUNTER — Emergency Department (HOSPITAL_COMMUNITY): Payer: Medicare Other

## 2021-03-26 ENCOUNTER — Observation Stay (HOSPITAL_COMMUNITY): Payer: Medicare Other

## 2021-03-26 ENCOUNTER — Other Ambulatory Visit: Payer: Self-pay

## 2021-03-26 ENCOUNTER — Observation Stay (HOSPITAL_COMMUNITY)
Admission: EM | Admit: 2021-03-26 | Discharge: 2021-03-28 | Disposition: A | Discharge status: Home / Self Care | Payer: Medicare Other | Attending: Internal Medicine | Admitting: Internal Medicine

## 2021-03-26 DIAGNOSIS — R55 Syncope and collapse: Secondary | ICD-10-CM | POA: Insufficient documentation

## 2021-03-26 DIAGNOSIS — R4182 Altered mental status, unspecified: Principal | ICD-10-CM | POA: Insufficient documentation

## 2021-03-26 DIAGNOSIS — I130 Hypertensive heart and chronic kidney disease with heart failure and stage 1 through stage 4 chronic kidney disease, or unspecified chronic kidney disease: Secondary | ICD-10-CM | POA: Diagnosis not present

## 2021-03-26 DIAGNOSIS — Z96653 Presence of artificial knee joint, bilateral: Secondary | ICD-10-CM | POA: Diagnosis not present

## 2021-03-26 DIAGNOSIS — E1122 Type 2 diabetes mellitus with diabetic chronic kidney disease: Secondary | ICD-10-CM | POA: Insufficient documentation

## 2021-03-26 DIAGNOSIS — Z79899 Other long term (current) drug therapy: Secondary | ICD-10-CM | POA: Insufficient documentation

## 2021-03-26 DIAGNOSIS — N1831 Chronic kidney disease, stage 3a: Secondary | ICD-10-CM | POA: Diagnosis not present

## 2021-03-26 DIAGNOSIS — Z20822 Contact with and (suspected) exposure to covid-19: Secondary | ICD-10-CM | POA: Insufficient documentation

## 2021-03-26 DIAGNOSIS — Z7901 Long term (current) use of anticoagulants: Secondary | ICD-10-CM | POA: Diagnosis not present

## 2021-03-26 DIAGNOSIS — R29810 Facial weakness: Secondary | ICD-10-CM | POA: Diagnosis present

## 2021-03-26 DIAGNOSIS — I5042 Chronic combined systolic (congestive) and diastolic (congestive) heart failure: Secondary | ICD-10-CM | POA: Insufficient documentation

## 2021-03-26 DIAGNOSIS — I2510 Atherosclerotic heart disease of native coronary artery without angina pectoris: Secondary | ICD-10-CM | POA: Insufficient documentation

## 2021-03-26 LAB — DIFFERENTIAL
Abs Immature Granulocytes: 0.08 10*3/uL — ABNORMAL HIGH (ref 0.00–0.07)
Basophils Absolute: 0.1 10*3/uL (ref 0.0–0.1)
Basophils Relative: 1 %
Eosinophils Absolute: 0.2 10*3/uL (ref 0.0–0.5)
Eosinophils Relative: 1 %
Immature Granulocytes: 1 %
Lymphocytes Relative: 12 %
Lymphs Abs: 2 10*3/uL (ref 0.7–4.0)
Monocytes Absolute: 0.7 10*3/uL (ref 0.1–1.0)
Monocytes Relative: 4 %
Neutro Abs: 12.9 10*3/uL — ABNORMAL HIGH (ref 1.7–7.7)
Neutrophils Relative %: 81 %

## 2021-03-26 LAB — CBG MONITORING, ED: Glucose-Capillary: 117 mg/dL — ABNORMAL HIGH (ref 70–99)

## 2021-03-26 LAB — BRAIN NATRIURETIC PEPTIDE: B Natriuretic Peptide: 76.9 pg/mL (ref 0.0–100.0)

## 2021-03-26 LAB — URINALYSIS, ROUTINE W REFLEX MICROSCOPIC
Bilirubin Urine: NEGATIVE
Glucose, UA: NEGATIVE mg/dL
Hgb urine dipstick: NEGATIVE
Ketones, ur: NEGATIVE mg/dL
Nitrite: NEGATIVE
Protein, ur: NEGATIVE mg/dL
Specific Gravity, Urine: 1.005 (ref 1.005–1.030)
pH: 6 (ref 5.0–8.0)

## 2021-03-26 LAB — TSH: TSH: 1.44 u[IU]/mL (ref 0.350–4.500)

## 2021-03-26 LAB — CBC
HCT: 31 % — ABNORMAL LOW (ref 36.0–46.0)
Hemoglobin: 9.4 g/dL — ABNORMAL LOW (ref 12.0–15.0)
MCH: 31.5 pg (ref 26.0–34.0)
MCHC: 30.3 g/dL (ref 30.0–36.0)
MCV: 104 fL — ABNORMAL HIGH (ref 80.0–100.0)
Platelets: 270 10*3/uL (ref 150–400)
RBC: 2.98 MIL/uL — ABNORMAL LOW (ref 3.87–5.11)
RDW: 16.8 % — ABNORMAL HIGH (ref 11.5–15.5)
WBC: 16 10*3/uL — ABNORMAL HIGH (ref 4.0–10.5)
nRBC: 0.1 % (ref 0.0–0.2)

## 2021-03-26 LAB — RESP PANEL BY RT-PCR (FLU A&B, COVID) ARPGX2
Influenza A by PCR: NEGATIVE
Influenza B by PCR: NEGATIVE
SARS Coronavirus 2 by RT PCR: NEGATIVE

## 2021-03-26 LAB — COMPREHENSIVE METABOLIC PANEL
ALT: 12 U/L (ref 0–44)
AST: 16 U/L (ref 15–41)
Albumin: 3.3 g/dL — ABNORMAL LOW (ref 3.5–5.0)
Alkaline Phosphatase: 48 U/L (ref 38–126)
Anion gap: 10 (ref 5–15)
BUN: 33 mg/dL — ABNORMAL HIGH (ref 8–23)
CO2: 23 mmol/L (ref 22–32)
Calcium: 8.7 mg/dL — ABNORMAL LOW (ref 8.9–10.3)
Chloride: 101 mmol/L (ref 98–111)
Creatinine, Ser: 1.09 mg/dL — ABNORMAL HIGH (ref 0.44–1.00)
GFR, Estimated: 49 mL/min — ABNORMAL LOW (ref >60)
Glucose, Bld: 117 mg/dL — ABNORMAL HIGH (ref 70–99)
Potassium: 4 mmol/L (ref 3.5–5.1)
Sodium: 134 mmol/L — ABNORMAL LOW (ref 135–145)
Total Bilirubin: 0.6 mg/dL (ref 0.3–1.2)
Total Protein: 6.1 g/dL — ABNORMAL LOW (ref 6.5–8.1)

## 2021-03-26 LAB — RAPID URINE DRUG SCREEN, HOSP PERFORMED
Amphetamines: NOT DETECTED
Barbiturates: NOT DETECTED
Benzodiazepines: NOT DETECTED
Cocaine: NOT DETECTED
Opiates: NOT DETECTED
Tetrahydrocannabinol: NOT DETECTED

## 2021-03-26 LAB — I-STAT CHEM 8, ED
BUN: 34 mg/dL — ABNORMAL HIGH (ref 8–23)
Calcium, Ion: 1.03 mmol/L — ABNORMAL LOW (ref 1.15–1.40)
Chloride: 102 mmol/L (ref 98–111)
Creatinine, Ser: 1.1 mg/dL — ABNORMAL HIGH (ref 0.44–1.00)
Glucose, Bld: 116 mg/dL — ABNORMAL HIGH (ref 70–99)
HCT: 31 % — ABNORMAL LOW (ref 36.0–46.0)
Hemoglobin: 10.5 g/dL — ABNORMAL LOW (ref 12.0–15.0)
Potassium: 3.9 mmol/L (ref 3.5–5.1)
Sodium: 134 mmol/L — ABNORMAL LOW (ref 135–145)
TCO2: 25 mmol/L (ref 22–32)

## 2021-03-26 LAB — HEMOGLOBIN A1C
Hgb A1c MFr Bld: 5.8 % — ABNORMAL HIGH (ref 4.8–5.6)
Mean Plasma Glucose: 119.76 mg/dL

## 2021-03-26 LAB — PROTIME-INR
INR: 1 (ref 0.8–1.2)
Prothrombin Time: 13 seconds (ref 11.4–15.2)

## 2021-03-26 LAB — TROPONIN I (HIGH SENSITIVITY)
Troponin I (High Sensitivity): 9 ng/L (ref <18)
Troponin I (High Sensitivity): 7 ng/L (ref <18)

## 2021-03-26 LAB — APTT: aPTT: 23 seconds — ABNORMAL LOW (ref 24–36)

## 2021-03-26 LAB — LACTIC ACID, PLASMA: Lactic Acid, Venous: 1.3 mmol/L (ref 0.5–1.9)

## 2021-03-26 LAB — ETHANOL: Alcohol, Ethyl (B): <10 mg/dL (ref <10)

## 2021-03-26 MED ORDER — RIVAROXABAN 20 MG PO TABS: 20.0000 mg | ORAL_TABLET | Freq: Every day | ORAL | Status: DC

## 2021-03-26 MED ORDER — ENOXAPARIN SODIUM 40 MG/0.4ML IJ SOSY
40.0000 mg | PREFILLED_SYRINGE | INTRAMUSCULAR | Status: DC
  Administered 2021-03-26: 40 mg via SUBCUTANEOUS
  Filled 2021-03-26: qty 0.4

## 2021-03-26 NOTE — ED Notes (Signed)
Pt daughter [name] called for update please call [phone]

## 2021-03-26 NOTE — ED Notes (Signed)
Dr. Ephriam Knuckles advised that there is a discrepancy on the pt's medication list vs the meds that we have listed. She is not comfortable ordering home medications until med rec is completed and verified with pt's facility.

## 2021-03-26 NOTE — ED Notes (Signed)
Pt returned from MRI, pt had an episode of urine incontinence while at MRI, brief changed and purewick reconnected, will catch urine sample when pt urinates again.

## 2021-03-26 NOTE — ED Notes (Signed)
Received call from Dr. Ephriam Knuckles and she is reviewing home medications.

## 2021-03-26 NOTE — Consult Note (Addendum)
Neurology consult   CC: code stroke  History is obtained from: chart, daughter  HPI: [name] [name] if an 87 yo female with a PMHx of Rheumatoid Arthritis and OA causing her to be mostly bed bound, prior stroke 2015 with sequelae of RLE weakness, seizure, syncope, hx 1st degree AV block CKD, DM II, HLD, HTN, CAD h/o MI in the 1970's, and Cs/dHF. Patient presented to [facility] ED today after family noticed an acute onset of facial droop at 1030 hours. LKW 1015 hours. She became unresponsive with left sided facial droop. She was unresponsive x 10 minutes. Return of LOC was noted, but patient continued to be lethargic. No other neurological deficit was noted. Patient was placed in ED 1 and when examined, she was found to be sleepy and slow to respond. A code stroke was called by ED MD.   Patient was taken emergently to CT suite. No neurological deficit noted in CT (except BLE weakness, chronic, right over left). She was alert and oriented and quickly followed commands. BLE weakness noted, but unable to lift RLE at all. No facial droop. tPA was not given Xarelto. No suspicion for LVO, so IR not considered.   Daughter not here for above, but NP spoke with her later. Per daughter, patient had gotten into the house from SNF this a.m. Patient was her normal self and sitting in a wheelchair awaiting breakfast. At 1030, daughter brought breakfast and noted patient slumped toward the left in her wheelchair with her eyes rolled back and was diaphoretic. +LOC. No shaking/jerking/twitching noted during event. Left facial droop noted. Daughter called 911 and by the time EMS arrived, patient was able to talk. Per daughter, total LOC time 10-15 mins. Seizure is on patient's chart as PMHx, but daughter states patient has never had a seizure. Daughter also denies the fact that patient has had a stroke in the past. Just "mini" strokes.   In review of chart, patient lives in SNF facility but goes home every Wednesday where she has a  CNA help her. She was at home when event occurred. She is seen at SNF by facility NP. [name] MRS is 5. She requires assistance with all ADLs, is sometimes incontinent or uses bedpan. She is no longer able to walk due to all of her spinal arthritic type problems.   There are syncope episodes mentioned in past notes.  Echo 06/2018 LVEF 20-25% and pulmonary HTN. R and L heart cath without obstruction. F/up EF 60-65% in 08/2018 and 01/2019 was back down to 30-35%, per cardiology last OV note.    LKW: 1015 hours tpa given?: no, patient on Xarelto IR Thrombectomy?: no LVO not suspected.  MRS 5  NIHSS:  1a Level of Consciousness: 0 1b LOC Questions: 0 1c LOC Commands: 0 2 Best Gaze: 0 3 Visual: 0 4 Facial Palsy: 0 5a Motor Arm - left: 0 5b Motor Arm - Right: 0 6a Motor Leg - Left:0  6b Motor Leg - Right: 2 7 Limb Ataxia: 0 8 Sensory: 0 9 Best Language: 0 10 Dysarthria: 0 11 Extinction and Inattention: 0 TOTAL:  2  ROS: A robust ROS was unable to be performed due to emergent nature of event.  Rest per HPI.    Past Medical History:  Diagnosis Date  . Allergic rhinitis due to pollen 04/27/2007  . Arthritis    "in q joint" (08/07/2013)  . CHF (congestive heart failure) (HCC)   . Coronary atherosclerosis of native coronary artery   . Cough   .  Edema 05/03/2013  . Exertional shortness of breath    "sometimes" (08/07/2013)  . First degree atrioventricular block   . Gout, unspecified 04/19/2013  . Hyperlipidemia 04/27/2007  . Hypertension   . Midline low back pain without sciatica 06/27/2014  . Muscle spasm   . Muscle weakness (generalized)   . Onychia and paronychia of toe   . Osteoarthrosis, unspecified whether generalized or localized, unspecified site 04/27/2007  . Other fall   . Other malaise and fatigue   . Pneumonia 05/2018  . Prepatellar bursitis   . Pulmonary embolism (HCC) 07/2018  . Rheumatic nodule (HCC) 06/28/2017  . Seizures (HCC)   . Stroke (HCC) 01/17/2014   . Type II diabetes mellitus (HCC)    "fasting 90-110s" (08/07/2013)  . Unspecified vitamin D deficiency      Family History  Problem Relation Age of Onset  . Heart attack Father     Social History:  reports that she has never smoked. She has never used smokeless tobacco. She reports that she does not drink alcohol and does not use drugs.   Prior to Admission medications   Medication Sig Start Date End Date Taking? Authorizing Provider  acetaminophen (TYLENOL) 500 MG tablet Take 1,000 mg by mouth 3 (three) times daily.     [provider]  albuterol (VENTOLIN HFA) 108 (90 Base) MCG/ACT inhaler Inhale 2 puffs into the lungs every 6 (six) hours as needed for wheezing or shortness of breath.    [provider]  allopurinol (ZYLOPRIM) 100 MG tablet Take by mouth daily.    [provider]  AMLODIPINE BESYLATE PO Take 5 mg by mouth.    [provider]  benzonatate (TESSALON) 100 MG capsule Take by mouth 3 (three) times daily.    [provider]  carvedilol (COREG) 25 MG tablet Take 25 mg by mouth 2 (two) times daily with a meal.    [provider]  Cholecalciferol (VITAMIN D3 PO) Take 2,000 Units by mouth.    [provider]  fluticasone (FLONASE ALLERGY RELIEF) 50 MCG/ACT nasal spray Place into both nostrils daily.    [provider]  furosemide (LASIX) 40 MG tablet Take 40 mg by mouth daily.    [provider]  gabapentin (NEURONTIN) 100 MG capsule Take 100 mg by mouth 3 (three) times daily.    [provider]  hydrALAZINE (APRESOLINE) 25 MG tablet Take 25 mg by mouth 2 (two) times daily as needed (FOR SBP 140 OR HIGHER).    [provider]  hydroxychloroquine (PLAQUENIL) 200 MG tablet Take 1 tablet (200 mg total) by mouth 2 (two) times daily. 12/20/18   Roberto Scales D, MD  isosorbide mononitrate (IMDUR) 30 MG 24 hr tablet TAKE 1 TABLET BY MOUTH EVERY DAY 09/21/19   Cirigliano, Mary K, DO   Lactobacillus (ACIDOPHILUS PO) Take by mouth.    [provider]  leflunomide (ARAVA) 20 MG tablet Take 20 mg by mouth daily.    [provider]  loratadine (CLARITIN) 10 MG tablet Take 10 mg by mouth daily.    [provider]  melatonin 3 MG TABS tablet Take 3 mg by mouth 2 (two) times daily.    [provider]  Menthol, Topical Analgesic, (BIOFREEZE) 4 % GEL Apply topically.    [provider]  Olopatadine HCl 0.2 % SOLN INSTILL 1 DROP IN BOTH EYES DAILY 02/19/20   Overton Mam, DO  Omega 3-6-9 Fatty Acids (OMEGA 3-6-9 COMPLEX) CAPS Take 1  capsule by mouth daily. Omega 3 Black    [provider]  polyethylene glycol (MIRALAX / GLYCOLAX) 17 g packet Take 17 g by mouth daily.    [provider]  potassium chloride SA (K-DUR) 20 MEQ tablet Take 20 mEq by mouth See admin instructions. Monday and Thursday    [provider]  predniSONE (DELTASONE) 5 MG tablet Take 1.5 tablets (7.5 mg total) by mouth daily with breakfast. 12/27/19   Cirigliano, Jearld Lesch, DO  Sacubitril-Valsartan (ENTRESTO PO) Take 49 mg by mouth.    [provider]  senna-docusate (SENOKOT-S) 8.6-50 MG tablet Take 1 tablet by mouth in the morning and at bedtime. At bedtime    [provider]  sitaGLIPtin (JANUVIA) 25 MG tablet Take 25 mg by mouth daily.    [provider]  XARELTO 20 MG TABS tablet TAKE 1 TABLET(20 MG) BY MOUTH DAILY 01/29/20   Cirigliano, Jearld Lesch, DO    Exam: Current vital signs: BP (!) 101/57   Pulse 87   Temp 97.9 F (36.6 C) (Oral)   Resp (!) 21   Ht 5\' 4"  (1.626 m)   Wt 98 kg   SpO2 94%   BMI 37.08 kg/m   Physical Exam  Constitutional: Appears chronically ill, well-developed and well-nourished. Obese.  Psych: Affect light. Calm.  Eyes: No scleral injection HENT: No OP obstrucion Head: Normocephalic.  Cardiovascular: Normal rate and regular rhythm.  Respiratory: Effort normal. GI: Soft.  No  distension. There is no tenderness.  Skin: WDI Extremities: well healed scars to bilateral knees. TEDs on.   Neuro: Mental Status: Patient is awake, alert, oriented to person, place, month, year, and situation. Patient is unable to give a clear and coherent history due to LOC.  No signs of neglect. Speech/Language:  Speech is clear, fluent without dysarthria or aphasia. Repetition, naming, and comprehension intact.  Cranial Nerves: II: Visual Fields are full. Pupils are equal, round, and reactive to light.  III,IV, VI: EOMI without ptosis or diploplia.  V: Facial sensation is symmetric to light touch in V1, V2, and V3 VII: Facial movement is symmetric.  VIII: hearing is intact to voice. X: Uvula elevates symmetrically. XI: Shoulder shrug is symmetric. XII: tongue is midline without atrophy or fasciculations.  Motor: RUE:  grips 4/5     biceps 4/5     triceps 4/5 LUE:  grips   4+/5    biceps  4+/5     triceps  4+/5 RLE: knee  5/5     thigh   4/5     plantar flexion   4-/5     dorsiflexion  3+/5 LLE: knee  5/5     thigh   4/5      plantar flexion   4/5     dorsiflexion  4/5 Tone is normal. Bulk is normal. 5/5 strength was present in all four extremities.  Sensory: Sensation is symmetric to light touch in all fours extremities. Extinction absent to light touch DSS.  Deep Tendon Reflexes: RUE:  Brachioradialis 1+    RLE: patella 0 LUE: brachioradialis 1+  triceps LLE: patella 0 Plantars: Toes are downgoing bilaterally.  Cerebellar: No ataxia noted with FNF. No drift UEs. Unable to perform HKS due to generalized LE weakness.     I have reviewed labs in epic and the pertinent results are: WBCC 16K    Hgb 9.4   INR 1    APTT 23    Glucose 116   UDS negative  ETOH < 10  MD reviewed the images obtained:  NCT head  No evidence of acute intracranial abnormality. Stable parenchymal atrophy and cerebral white matter chronic small vessel ischemic disease.  MRI brain  ordered.  EEG ordered.    Assessment: 87 yo female who presented today to the ED secondary to family noticing a left facial droop at home. However, more explicit history per daughter, states patient was her normal self when she came home to visit from the SNF. Patient was sitting in her wheelchair and daughter found her slumped over to the left side with eyes rolled back. Patient was not shaking/jerking head or extremities. She was diaphoretic. LOC x 10-15 mins. When EMS arrived, she was talking. However, when roomed in the ED, patient was very lethargic and slow to respond. Therefore, ED MD activated code stroke. In CT, patient was awake and alert and following commands quickly.  -stroke risk factors include history of TIAs, HLD, and HTN.  -NIHSS 2 for right leg weakness.  -CT negative for acute finding.  -MRS 5.  -tPA not given due to use of Xeralto.  -IR not a consideration due to poor mRS -Her weakness noted on exam is not focal and is likely related to her overall deconditioning, generalized weakness due to arthritis and being bed bound most of time.  -Due to being found unconscious and being slumped over in chair, seizure activity must be considered. Per daughter, patient has never had a seizure, but seizure is listed on chart.  Also listed a stroke which the daughter says she has never had-only has had mini strokes. -Patient has had documented syncopal episodes in the past. -Patient will be admitted for syncope vs TIA/stroke vs seizure workup.   IMP: -Evaluate for syncope - Evaluate for seizure - Evaluate for stroke  Plan: - Medicine admit. - MRI brain without contrast- completed - no stroke - MRA head and neck. - TTE. - HbA1c, lipid panel, TSH. UA and culture, CXR, CBC/CMP in am. - Recommend Statin or increased dose if LDL > 70 - continue Xeralto, but wait to start until MRI results are clear of any bleed.  - SBP goal - Permissive hypertension first 24 h < 220/110. Hold home  medications for now. - Telemetry monitoring for arrhythmia. - bedside Swallow screen. - Stroke education. - PT/OT/SLP consult. - NIHSS as per protocol. - frequent neuro checks.  - rEEG to eval for any epileptogenicity.   This patient is critically ill and at significant risk of neurological worsening, death and care requires constant monitoring of vital signs, hemodynamics,respiratory and cardiac monitoring, neurological assessment, discussion with family, other specialists and medical decision making of high complexity. I spent of neurocritical care time  in the care of  this patient. This was time spent independent of any time provided by nurse practitioner or PA.  Electronically signed by: Jimmye Norman, MSN, APN-BC, nurse practitioner and by MD. Note/plan to be edited by MD as needed.  Pager: [phone]    Attending Neurohospitalist Addendum Patient seen and examined with APP/Resident. Agree with the history and physical as documented above. Agree with the plan as documented, which I helped formulate. I have independently reviewed the chart, obtained history, review of systems and examined the patient.I have personally reviewed pertinent head/neck/spine imaging (CT/MRI).  Briefly, patient is a 87 year old woman with rheumatoid ulcers, osteoarthritis, according to family prior mini strokes with some right lower extremity weakness, history of seizures documented in the chart-family unaware, syncopes, history of  first-degree AV block, CKD, diabetes, hypertension, coronary artery disease, essentially bedbound at baseline presented after an episode where she became unresponsive for 10 to 15 minutes and then had some left-sided facial drooping. Her symptoms improved spontaneously. On my evaluation, the NIH stroke scale was 2-as documented above. Given the extensive cerebrovascular risk factor history in the chart, I would pursue a stroke/TIA work-up and obtain imaging.   Syncopal episodes also remain in the differentials.  I would also do an EEG to look for any evidence of underlying electrographic abnormality, which might point to a seizure etiology.. Full set of recommendations above-I have made the edits as necessary. Neurology will follow the patient Plan was discussed with Dr. Pecola Leisure in the ER   -- Milon Dikes, MD Neurologist [facility] Pager: [phone]  CRITICAL CARE ATTESTATION Performed by: Milon Dikes, MD Total critical care time: 39 minutes Critical care time was exclusive of separately billable procedures and treating other patients and/or supervising APPs/Residents/Students Critical care was necessary to treat or prevent imminent or life-threatening deterioration due to sudden loss of consciousness-evaluation for acute stroke like symptoms, concern for seizures. This patient is critically ill and at significant risk for neurological worsening and/or death and care requires constant monitoring. Critical care was time spent personally by me on the following activities: development of treatment plan with patient and/or surrogate as well as nursing, discussions with consultants, evaluation of patient's response to treatment, examination of patient, obtaining history from patient or surrogate, ordering and performing treatments and interventions, ordering and review of laboratory studies, ordering and review of radiographic studies, pulse oximetry, re-evaluation of patient's condition, participation in multidisciplinary rounds and medical decision making of high complexity in the care of this patient.

## 2021-03-26 NOTE — ED Notes (Signed)
EEG at bedside.

## 2021-03-26 NOTE — ED Notes (Signed)
S/W Daughter Para March via phone and updated on pt status.

## 2021-03-26 NOTE — ED Notes (Signed)
Attempted to call [facility] -- [phone] - No answer.

## 2021-03-26 NOTE — ED Notes (Signed)
Patient transported to MRI 

## 2021-03-26 NOTE — ED Notes (Signed)
Dinner tray ordered.

## 2021-03-26 NOTE — H&P (Addendum)
Date: 03/26/2021               Patient Name:  [name] [name] MRN: [id number]  DOB: September 13, 1930 Age / Sex: 87 y.o., female   PCP: Pcp, No         Medical Service: Internal Medicine Teaching Service         Attending Physician: Dr. Mayford Knife, Dorene Ar, MD    First Contact: Dr. Elaina Pattee Pager: [phone]  Second Contact: Dr. Sande Brothers Pager: [phone]       After Hours (After 5p/  First Contact Pager: [phone]  weekends / holidays): Second Contact Pager: [phone]   Chief Complaint: Altered mental status  History of Present Illness:  Ms. [name] [name] is a 87 year old female with history of  combined systolic and diastolic heart failure, CAD, CKD III,  type 2 diabetes melltus, hypertension, hyperlipidemia, prior TIA, PE/DVT on xarelto, and rheumatoid arthritis who presents today for episode of unresponsiveness. She does not recall the event. She states that her daughter saw her "spazzing" out and not talking back to her daughter. Episode lasted about 10 minutes. She does remember waking up slumped in her chair with people around her, which was EMS. Denies precipitating symptoms prior to this episode. Denies fever, chills, chest pain, palpitations, nausea, vomiting, abdominal pain, dysuria, haematuria, or urinary frequency, dizziness, headache, weakness, numbness, or loss of bowel or bladder control.   Otherwise she has been in her usual state of health. Went to her house today from [employer] to celebrate daughter's birthday when this episode happened around 10:30 this morning. She has been wheelchair bound for the past 2 years which she attributes to her joint pain from arthritis. Daughter notes she was slumped to the right, diaphoretic, and not responsive also noted to have left facial droop, subsequently took her to ED. Seizure is listed on history but family denies this.   Meds:  Current Meds  Medication Sig  . albuterol (VENTOLIN HFA) 108 (90 Base) MCG/ACT inhaler Inhale 2 puffs into the  lungs every 6 (six) hours as needed for wheezing or shortness of breath.  . Cholecalciferol (D3-50) 1.25 MG (50000 UT) capsule Take 50,000 Units by mouth every 30 (thirty) days.  . fluticasone (FLONASE) 50 MCG/ACT nasal spray Place 2 sprays into both nostrils in the morning and at bedtime.  . hydrALAZINE (APRESOLINE) 25 MG tablet Take 25 mg by mouth See admin instructions. Take 25 mg by mouth two times a day (for a Systolic reading of equal to or greater than 140)  . Lactobacillus Acid-Pectin (ACIDOPHILUS/PECTIN) CAPS Take 1 capsule by mouth daily.  . sacubitril-valsartan (ENTRESTO) 49-51 MG Take 1 tablet by mouth 2 (two) times daily.    Allergies: Allergies as of 03/26/2021 - Review Complete 03/26/2021  Allergen Reaction Noted  . Clonidine derivatives Swelling 09/21/2016  . Fish allergy Diarrhea, Swelling, and Other (See Comments) 04/19/2013  . Shellfish allergy Hives 07/28/2013  . Doxycycline Rash and Other (See Comments) 05/07/2017  . Cetirizine hcl Itching 04/26/2020  . Citrus Diarrhea and Other (See Comments) 05/25/2017  . Indomethacin Other (See Comments)   . Methyldopa Diarrhea 04/26/2020  . Morphine and related Nausea And Vomiting and Other (See Comments) 07/24/2018  . Morphine sulfate Nausea And Vomiting and Other (See Comments) 04/26/2020  . Pregabalin Nausea Only and Other (See Comments) 05/18/2013  . Shellfish-derived products Itching and Other (See Comments) 04/26/2020  . Strawberry (diagnostic) Itching 07/15/2018  . Strawberry extract Itching and Other (See Comments) 04/26/2020  .  Cetirizine hcl Itching and Rash   . Codeine Hives and Itching 04/26/2020  . Levaquin [levofloxacin in d5w] Rash 12/03/2016  . Levofloxacin Rash 04/26/2020  . Tomato Rash 07/28/2013   Past Medical History:  Diagnosis Date  . Allergic rhinitis due to pollen 04/27/2007  . Arthritis    "in q joint" (08/07/2013)  . CHF (congestive heart failure) (HCC)   . Coronary atherosclerosis of native  coronary artery   . Cough   . Edema 05/03/2013  . Exertional shortness of breath    "sometimes" (08/07/2013)  . First degree atrioventricular block   . Gout, unspecified 04/19/2013  . Hyperlipidemia 04/27/2007  . Hypertension   . Midline low back pain without sciatica 06/27/2014  . Muscle spasm   . Muscle weakness (generalized)   . Onychia and paronychia of toe   . Osteoarthrosis, unspecified whether generalized or localized, unspecified site 04/27/2007  . Other fall   . Other malaise and fatigue   . Pneumonia 05/2018  . Prepatellar bursitis   . Pulmonary embolism (HCC) 07/2018  . Rheumatic nodule (HCC) 06/28/2017  . Seizures (HCC)   . Stroke (HCC) 01/17/2014  . Type II diabetes mellitus (HCC)    "fasting 90-110s" (08/07/2013)  . Unspecified vitamin D deficiency     Family History: Father with history of heart attack  Social History: Lives at [place] [facility]. She is bed/bound wheelchair bound. Requires significant help for all adls. Denies tobacco, alcohol, or illicit drug use.   Review of Systems: A complete ROS was negative except as per HPI.   Physical Exam: Blood pressure (!) 147/85, pulse 92, temperature 97.9 F (36.6 C), temperature source Oral, resp. rate 17, height 5\' 4"  (1.626 m), weight 98 kg, SpO2 96 %. Physical Exam Constitutional:      Appearance: Normal appearance. She is obese.  HENT:     Head: Normocephalic and atraumatic.     Mouth/Throat:     Mouth: Mucous membranes are moist.     Pharynx: Oropharynx is clear.  Eyes:     Conjunctiva/sclera: Conjunctivae normal.     Pupils: Pupils are equal, round, and reactive to light.  Cardiovascular:     Rate and Rhythm: Normal rate and regular rhythm.  Pulmonary:     Effort: Pulmonary effort is normal.     Breath sounds: Rhonchi (bibasilar ) present.  Abdominal:     General: Abdomen is flat. Bowel sounds are normal. There is no distension.     Palpations: Abdomen is soft.     Tenderness: There is no abdominal  tenderness.  Musculoskeletal:     Right lower leg: Edema (2+) present.     Left lower leg: Edema (2+) present.  Skin:    General: Skin is warm and dry.  Neurological:     Mental Status: She is alert and oriented to person, place, and time.     Comments: Speech is clear, sensation intact, 5/5 strength of upper extremities, 3/5 strength of LLE, 2+/5 strength RLE   Psychiatric:        Mood and Affect: Mood normal.        Behavior: Behavior normal.    EKG: HR 81, sinus rhytmn, LVH, no acute ischemic changes  1. No acute intracranial abnormality. 2. Moderate chronic microvascular ischemic changes of the white matter and mild parenchymal volume loss, stable.   DG Chest Port 1 View Stable chest with chronic cardiomegaly and bibasilar atelectasis. No acute cardiopulmonary process. Electronically Signed   By: Hilarie Fredrickson.D.  On: 03/26/2021 14:32   CT HEAD CODE STROKE WO CONTRAST No evidence of acute intracranial abnormality. Stable parenchymal atrophy and cerebral white matter chronic small vessel ischemic disease. Paranasal sinus disease, as described. Electronically Signed   By: Jackey Loge DO   On: 03/26/2021 12:33   Assessment & Plan by Problem: Active Problems:   Altered mental status  Ms. Cadince Hilscher is a 87 year old female with history of  combined systolic and diastolic heart failure, CAD, CKD III,  type 2 diabetes melltus, hypertension, hyperlipidemia, prior TIA, PE/DVT on xarellto, and rheumatoid arthritis who presents today for episode of unresponsiveness and admitted for work up of altered mental status.   Altered mental status Patient presenting with 10 minute episode of unresponsiveness today. Lethargic upon waking. No seizure like activity noted. Daughter noted left facial droop. BP have been labile in ED. Alert and oriented x3. No tongue bite or loss of bowel or bladder incontinence. WBC 16, lactate wnl. CXR unremarkable. No acute EKG changes. Troponins normal. Code  stroke initiated for CVA vs TIA. CT and MRI negative for acute stroke. EEG preformed. Does not appear to be acutely infected. Other etiologies include orthostatic vs cardiogenic syncope. Has had history of orthostatic hypotension in past. Appears cardiology stopped her antihypertensives at last visit in October.  - follow up EEG - Echocardiogram - Follow up A1c, lipid panel, TSH - Follow up UA, and culture - restart Xarelto 20 mg daily - Orthostatic vitals - Monitor on tele  Chronic combined systolic and diastolic CHF  Hypertension Last echo on 06/01/2019 with EF of 30-35%, diffuse hypokinesis. Appears to have been stopped on many of her medications in October due to hypotension. Does appear mildly fluid overloaded on exam. Weight elevated from last November. Will obtain records from [facility] before restarting medications.  -BNP -Daily weight, strict I/os  History of PE and DVT -cont xarelto 20 mg daily  CKD stage IIIa Creatinine of 1.10 on admission, appears near baseline. - Monitor BMP  Hyperlipidemia History of HLD. Not on statin appears to have refused in the past. - Follow up Lipid panel  Type 2 diabetes mellitus Last A1c of 5.9. Does not appear to be on medications for this - A1c - monitor glucose  Rheumatoid arthritis  No active synovitis on exam  - Plaquenil, arava, on prednisone on prior medication list - Restart on home medications once able to obtain records  Diet: HH/carbmodifed  Fluids: none VTE ppx: Xarelto  Code: DNR  Dispo: Admit patient to Observation with expected length of stay less than 2 midnights.  Signed: Quincy Simmonds, MD 03/26/2021, 6:29 PM  Pager: [phone] After 5pm on weekdays and 1pm on weekends: On Call pager: [phone]

## 2021-03-26 NOTE — ED Triage Notes (Signed)
Pt bib ems for right sided facial droop starting at approximately 1030 this morning, witnessed by family. No other neuro deficits noted per EMS. Pt has hx of prior TIAs. Right facial droop noted when face is relaxed but no droop noted when she smiles. Pt alert and oriented. Has no complaints at this time

## 2021-03-26 NOTE — Code Documentation (Signed)
Patient was at daughter's house and known well at 1015. She is wheelchair bound and lives at [facility] when she is not visiting family. At 1030 daughter noticed the pt leaning to her right side, less responsive and with a facial droop. She called 911 and [facility] responded. They took [facility] to [facility] and code stroke was activated in triage. EMS was not sure about pt's baseline since she has had TIA's in the past and was also unclear about the LKW. Pt was cleared and taken for CT. CT results below. Pt's NIHSS was 2 for right leg inability to resist gravity. No facial droop at the time of assessment. No TPA given d/t symptoms mild. Care Plan: q15 vitals q30 mNIHSS while in the TPA window until 1445, EEG, MRI. Hand off with Ladona Ridgel, RN.   "IMPRESSION: No evidence of acute intracranial abnormality.  Stable parenchymal atrophy and cerebral white matter chronic small vessel ischemic disease.  Paranasal sinus disease, as described."   Mattia Liford, [place] Scrape, RN  Stroke Response Nurse

## 2021-03-26 NOTE — ED Notes (Signed)
S/W pt's daughter via phone and updated on status. Daughter expressed concern that pt has not had home medications all day. Advised that this nurse would message provider for home medications to be ordered. Cruzita Lederer, MD a page regarding needing home medications ordered. Pt provided verbal consent to speak with daughter.

## 2021-03-26 NOTE — ED Provider Notes (Signed)
[facility] EMERGENCY DEPARTMENT Provider Note   CSN: [id number] Arrival date & time: 03/26/21  1138     History Chief Complaint  Patient presents with  . Facial Droop    [name] [name] is a 87 y.o. female.  The history is provided by the patient, the EMS personnel, a relative and medical records.   [name] [name] is a 87 y.o. female who presents to the Emergency Department complaining of facial droop. History is provided by patient, EMS and the daughter. She presents the emergency department by EMS for evaluation of new onset facial droop. She is a resident at a nursing facility but was on leave to visit family today. When she arrived at home she was at her baseline at 57. Shortly thereafter she was found to be unresponsive with left sided facial droop. She was unresponsive for about 10 minutes. Since return of consciousness she has been more lethargic than her baseline and not quite at her baseline. She does have a history of recurrent TIAs. She is on Xarelto. No recent illnesses. Patient states that she did feel lightheaded and as if she might pass out earlier today but on ED arrival denies any current complaints.    Past Medical History:  Diagnosis Date  . Allergic rhinitis due to pollen 04/27/2007  . Arthritis    "in q joint" (08/07/2013)  . CHF (congestive heart failure) (HCC)   . Coronary atherosclerosis of native coronary artery   . Cough   . Edema 05/03/2013  . Exertional shortness of breath    "sometimes" (08/07/2013)  . First degree atrioventricular block   . Gout, unspecified 04/19/2013  . Hyperlipidemia 04/27/2007  . Hypertension   . Midline low back pain without sciatica 06/27/2014  . Muscle spasm   . Muscle weakness (generalized)   . Onychia and paronychia of toe   . Osteoarthrosis, unspecified whether generalized or localized, unspecified site 04/27/2007  . Other fall   . Other malaise and fatigue   . Pneumonia 05/2018  . Prepatellar  bursitis   . Pulmonary embolism (HCC) 07/2018  . Rheumatic nodule (HCC) 06/28/2017  . Seizures (HCC)   . Stroke (HCC) 01/17/2014  . Type II diabetes mellitus (HCC)    "fasting 90-110s" (08/07/2013)  . Unspecified vitamin D deficiency     Patient Active Problem List   Diagnosis Date Noted  . Rheumatoid arthritis involving multiple sites with positive rheumatoid factor (HCC) 08/27/2020  . Near syncope 12/13/2019  . Pre-operative clearance 05/29/2019  . Volume overload 05/29/2019  . Acute metabolic encephalopathy 03/30/2019  . Pressure injury of skin 01/12/2019  . AMS (altered mental status) 01/09/2019  . Rheumatoid arthritis flare (HCC) 12/23/2018  . Weakness of right lower extremity 12/23/2018  . Abscess of right upper extremity 12/14/2018  . Fever 12/13/2018  . Hand swelling 12/02/2018  . Pain, wrist 12/02/2018  . Hand pain 12/02/2018  . Polyarthritis 12/02/2018  . Spinal stenosis of lumbar region without neurogenic claudication 11/19/2018  . Acute encephalopathy 11/19/2018  . History of CVA (cerebrovascular accident) 11/19/2018  . Acute gout 11/15/2018  . Low back pain 11/14/2018  . Chronic back pain 10/27/2018  . Arthralgia of both knees 10/27/2018  . Rheumatoid arthritis involving both knees (HCC) 10/27/2018  . Acute pain 10/21/2018  . Physical deconditioning 09/06/2018  . Hyperlipidemia associated with type 2 diabetes mellitus (HCC), refuses statin 09/06/2018  . Refusal of statin medication by patient 09/04/2018  . Deep vein thrombosis (DVT) of distal vein of  right lower extremity (HCC) 07/30/2018  . Hypertensive heart disease with heart failure (HCC) 07/30/2018  . Pulmonary embolism (HCC), 07/2018, on Xarelto with goal to DC after 6 months if more active 07/10/2018  . Elevated brain natriuretic peptide (BNP) level   . DCM (dilated cardiomyopathy) (HCC)   . Benign hypertensive heart and kidney disease with CHF and stage 3 chronic kidney disease (HCC) 05/28/2018  .  Osteoarthritis 05/06/2018  . Hypokalemia 02/02/2018  . Adhesive capsulitis of right shoulder 10/12/2017  . Idiopathic chronic gout of multiple sites with tophus 08/25/2017  . History of total knee arthroplasty, bilateral 02/18/2017  . History of rotator cuff surgery 02/18/2017  . High risk medications, long-term use 09/21/2016  . Chronic combined systolic and diastolic CHF (congestive heart failure) (HCC) 09/12/2015  . Bilateral edema of lower extremity 06/05/2014  . CKD stage 3 due to type 2 diabetes mellitus (HCC) 06/05/2014  . Type 2 diabetes mellitus with diabetic nephropathy, without long-term current use of insulin (HCC) 06/05/2014    Class: Chronic  . Anemia of chronic disease 05/01/2014  . CAD (coronary artery disease) 10/18/2013  . Hypertension associated with diabetes (HCC) 08/08/2013    Class: Chronic  . Fibromyalgia 05/10/2013  . Type 2 diabetes mellitus with hyperlipidemia (HCC), patient declines statin 04/27/2007  . Allergic rhinitis 04/27/2007  . Diverticulosis 04/27/2007    Past Surgical History:  Procedure Laterality Date  . ABDOMINAL HYSTERECTOMY  04/1980  . APPENDECTOMY  1960  . CARDIAC CATHETERIZATION  2003  . CATARACT EXTRACTION W/ INTRAOCULAR LENS  IMPLANT, BILATERAL Bilateral ~ 2012  . CHOLECYSTECTOMY N/A 06/26/2018   Procedure: LAPAROSCOPIC CHOLECYSTECTOMY POSSIBLE INTRAOPERATIVE CHOLANGIOGRAM;  Surgeon: Abigail Miyamoto, MD;  Location: [facility] OR;  Service: General;  Laterality: N/A;  . JOINT REPLACEMENT    . REPLACEMENT TOTAL KNEE Right 09/2005  . RIGHT/LEFT HEART CATH AND CORONARY ANGIOGRAPHY N/A 06/01/2018   Procedure: RIGHT/LEFT HEART CATH AND CORONARY ANGIOGRAPHY;  Surgeon: Corky Crafts, MD;  [facility] INVAS[facility] CV LAB;  Service: Cardiovascular;  Laterality: N/A;  . SHOULDER OPEN ROTATOR CUFF REPAIR Right 07/1999  . TONSILLECTOMY  08/1974  . TOTAL KNEE ARTHROPLASTY Left 08/07/2013   Procedure: TOTAL KNEE ARTHROPLASTY- LEFT;  Surgeon: Dannielle Huh,  MD;  Location: [facility] OR;  Service: Orthopedics;  Laterality: Left;  . TRANSTHORACIC ECHOCARDIOGRAM  2003   EF 55-65%; mild concentric LVH     OB History   No obstetric history on file.     Family History  Problem Relation Age of Onset  . Heart attack Father     Social History   Tobacco Use  . Smoking status: Never Smoker  . Smokeless tobacco: Never Used  Vaping Use  . Vaping Use: Never used  Substance Use Topics  . Alcohol use: No    Alcohol/week: 0.0 standard drinks  . Drug use: No    Home Medications Prior to Admission medications   Medication Sig Start Date End Date Taking? Authorizing Provider  acetaminophen (TYLENOL) 500 MG tablet Take 1,000 mg by mouth 3 (three) times daily.     [provider]  albuterol (VENTOLIN HFA) 108 (90 Base) MCG/ACT inhaler Inhale 2 puffs into the lungs every 6 (six) hours as needed for wheezing or shortness of breath.    [provider]  allopurinol (ZYLOPRIM) 100 MG tablet Take by mouth daily.    [provider]  AMLODIPINE BESYLATE PO Take 5 mg by mouth.    [provider]  benzonatate (TESSALON) 100 MG capsule Take  by mouth 3 (three) times daily.    [provider]  carvedilol (COREG) 25 MG tablet Take 25 mg by mouth 2 (two) times daily with a meal.    [provider]  Cholecalciferol (VITAMIN D3 PO) Take 2,000 Units by mouth.    [provider]  fluticasone (FLONASE ALLERGY RELIEF) 50 MCG/ACT nasal spray Place into both nostrils daily.    [provider]  furosemide (LASIX) 40 MG tablet Take 40 mg by mouth daily.    [provider]  gabapentin (NEURONTIN) 100 MG capsule Take 100 mg by mouth 3 (three) times daily.    [provider]  hydrALAZINE (APRESOLINE) 25 MG tablet Take 25 mg by mouth 2 (two) times daily as needed (FOR SBP 140 OR HIGHER).    [provider]  hydroxychloroquine (PLAQUENIL) 200 MG tablet Take 1 tablet (200 mg total) by mouth  2 (two) times daily. 12/20/18   Roberto Scales D, MD  isosorbide mononitrate (IMDUR) 30 MG 24 hr tablet TAKE 1 TABLET BY MOUTH EVERY DAY 09/21/19   Cirigliano, Mary K, DO  Lactobacillus (ACIDOPHILUS PO) Take by mouth.    [provider]  leflunomide (ARAVA) 20 MG tablet Take 20 mg by mouth daily.    [provider]  loratadine (CLARITIN) 10 MG tablet Take 10 mg by mouth daily.    [provider]  melatonin 3 MG TABS tablet Take 3 mg by mouth 2 (two) times daily.    [provider]  Menthol, Topical Analgesic, (BIOFREEZE) 4 % GEL Apply topically.    [provider]  Olopatadine HCl 0.2 % SOLN INSTILL 1 DROP IN BOTH EYES DAILY 02/19/20   Cirigliano, Jearld Lesch, DO  Omega 3-6-9 Fatty Acids (OMEGA 3-6-9 COMPLEX) CAPS Take 1 capsule by mouth daily. Omega 3 Black    [provider]  polyethylene glycol (MIRALAX / GLYCOLAX) 17 g packet Take 17 g by mouth daily.    [provider]  potassium chloride SA (K-DUR) 20 MEQ tablet Take 20 mEq by mouth See admin instructions. Monday and Thursday    [provider]  predniSONE (DELTASONE) 5 MG tablet Take 1.5 tablets (7.5 mg total) by mouth daily with breakfast. 12/27/19   Cirigliano, Jearld Lesch, DO  Sacubitril-Valsartan (ENTRESTO PO) Take 49 mg by mouth.    [provider]  senna-docusate (SENOKOT-S) 8.6-50 MG tablet Take 1 tablet by mouth in the morning and at bedtime. At bedtime    [provider]  sitaGLIPtin (JANUVIA) 25 MG tablet Take 25 mg by mouth daily.    [provider]  XARELTO 20 MG TABS tablet TAKE 1 TABLET(20 MG) BY MOUTH DAILY 01/29/20   Cirigliano, Jearld Lesch, DO    Allergies    Clonidine derivatives, Fish allergy, Shellfish allergy, Doxycycline, Indomethacin, Lyrica [pregabalin], Methyldopa, Morphine and related, Orange fruit [citrus], Strawberry (diagnostic), Cetirizine hcl, Codeine, Levaquin [levofloxacin in d5w], and Tomato  Review of Systems   Review of  Systems  All other systems reviewed and are negative.   Physical Exam Updated Vital Signs BP (!) 141/68 (BP Location: Left Arm)   Pulse 83   Temp 97.9 F (36.6 C) (Oral)   Resp 15   Ht 5\' 4"  (1.626 m)   Wt 98 kg   SpO2 97%   BMI 37.08 kg/m   Physical Exam Vitals and nursing note reviewed.  Constitutional:      Appearance: She is well-developed. She is ill-appearing.  HENT:     Head:  Normocephalic and atraumatic.  Cardiovascular:     Rate and Rhythm: Normal rate and regular rhythm.     Heart sounds: No murmur heard.   Pulmonary:     Effort: Pulmonary effort is normal. No respiratory distress.  Abdominal:     Palpations: Abdomen is soft.     Tenderness: There is no abdominal tenderness. There is no guarding or rebound.  Musculoskeletal:        General: No tenderness.     Comments: Edema to bilateral lower extremities 2+ DP pulses bilaterally  Skin:    General: Skin is warm and dry.  Neurological:     Mental Status: She is oriented to person, place, and time.     Comments: Drowsy. Awakens to verbal stimuli. Mild left-sided facial droop. 3/5 strength and bilateral lower extremities. 4 to 5 strength and bilateral upper extremities. Visual fields grossly intact.  Psychiatric:        Behavior: Behavior normal.     ED Results / Procedures / Treatments   Labs (all labs ordered are listed, but only abnormal results are displayed) Labs Reviewed  CBG MONITORING, ED - Abnormal; Notable for the following components:      Result Value   Glucose-Capillary 117 (*)    All other components within normal limits  I-STAT CHEM 8, ED - Abnormal; Notable for the following components:   Sodium 134 (*)    BUN 34 (*)    Creatinine, Ser 1.10 (*)    Glucose, Bld 116 (*)    Calcium, Ion 1.03 (*)    Hemoglobin 10.5 (*)    HCT 31.0 (*)    All other components within normal limits  RESP PANEL BY RT-PCR (FLU A&B, COVID) ARPGX2  ETHANOL  PROTIME-INR  APTT  CBC  DIFFERENTIAL   COMPREHENSIVE METABOLIC PANEL  RAPID URINE DRUG SCREEN, HOSP PERFORMED  URINALYSIS, ROUTINE W REFLEX MICROSCOPIC  I-STAT CHEM 8, ED    EKG None  Radiology No results found.  Procedures Procedures   Medications Ordered in ED Medications - No data to display  ED Course  I have reviewed the triage vital signs and the nursing notes.  Pertinent labs & imaging results that were available during my care of the patient were reviewed by me and considered in my medical decision making (see chart for details).    MDM Rules/Calculators/A&P                         patient presents the emergency department by EMS for evaluation of weakness. Patient drowsy but conversant on evaluation, does have generalized weakness as well as left facial weakness. Code stroke was activated on ED presentation. She was evaluated by neurology and found not to be a TPA candidate is symptoms are more concerning for possible syncope versus seizure. Plan to admit for ongoing workup. CBC with leukocytosis, no clear source of infection at this time. Medicine consulted for admission for ongoing workup.   Final Clinical Impression(s) / ED Diagnoses Final diagnoses:  None    Rx / DC Orders ED Discharge Orders    None       Tilden Fossa, MD 03/26/21 1658

## 2021-03-26 NOTE — Progress Notes (Signed)
EEG completed, results pending. 

## 2021-03-27 ENCOUNTER — Encounter (HOSPITAL_COMMUNITY): Payer: Self-pay | Admitting: Internal Medicine

## 2021-03-27 ENCOUNTER — Observation Stay (HOSPITAL_BASED_OUTPATIENT_CLINIC_OR_DEPARTMENT_OTHER): Payer: Medicare Other

## 2021-03-27 DIAGNOSIS — I370 Nonrheumatic pulmonary valve stenosis: Secondary | ICD-10-CM

## 2021-03-27 DIAGNOSIS — I361 Nonrheumatic tricuspid (valve) insufficiency: Secondary | ICD-10-CM | POA: Diagnosis not present

## 2021-03-27 DIAGNOSIS — R55 Syncope and collapse: Secondary | ICD-10-CM

## 2021-03-27 DIAGNOSIS — R4182 Altered mental status, unspecified: Secondary | ICD-10-CM

## 2021-03-27 LAB — ECHOCARDIOGRAM COMPLETE
AR max vel: 2.29 cm2
AV Area VTI: 2.19 cm2
AV Area mean vel: 2.33 cm2
AV Mean grad: 3 mmHg
AV Peak grad: 6.9 mmHg
Ao pk vel: 1.31 m/s
Area-P 1/2: 5.02 cm2
Height: 64 in
MV VTI: 2.27 cm2
S' Lateral: 2.7 cm
Weight: 3485.03 oz

## 2021-03-27 LAB — COMPREHENSIVE METABOLIC PANEL
ALT: 13 U/L (ref 0–44)
AST: 14 U/L — ABNORMAL LOW (ref 15–41)
Albumin: 3.2 g/dL — ABNORMAL LOW (ref 3.5–5.0)
Alkaline Phosphatase: 49 U/L (ref 38–126)
Anion gap: 9 (ref 5–15)
BUN: 29 mg/dL — ABNORMAL HIGH (ref 8–23)
CO2: 25 mmol/L (ref 22–32)
Calcium: 9 mg/dL (ref 8.9–10.3)
Chloride: 101 mmol/L (ref 98–111)
Creatinine, Ser: 1.07 mg/dL — ABNORMAL HIGH (ref 0.44–1.00)
GFR, Estimated: 50 mL/min — ABNORMAL LOW (ref >60)
Glucose, Bld: 98 mg/dL (ref 70–99)
Potassium: 4 mmol/L (ref 3.5–5.1)
Sodium: 135 mmol/L (ref 135–145)
Total Bilirubin: 0.4 mg/dL (ref 0.3–1.2)
Total Protein: 6.2 g/dL — ABNORMAL LOW (ref 6.5–8.1)

## 2021-03-27 LAB — MRSA PCR SCREENING: MRSA by PCR: NEGATIVE

## 2021-03-27 LAB — LIPID PANEL
Cholesterol: 181 mg/dL (ref 0–200)
HDL: 62 mg/dL (ref >40)
LDL Cholesterol: 104 mg/dL — ABNORMAL HIGH (ref 0–99)
Total CHOL/HDL Ratio: 2.9 RATIO
Triglycerides: 76 mg/dL (ref <150)
VLDL: 15 mg/dL (ref 0–40)

## 2021-03-27 LAB — FOLATE: Folate: 15.8 ng/mL (ref >5.9)

## 2021-03-27 LAB — CBC
HCT: 30.3 % — ABNORMAL LOW (ref 36.0–46.0)
Hemoglobin: 9.6 g/dL — ABNORMAL LOW (ref 12.0–15.0)
MCH: 32.5 pg (ref 26.0–34.0)
MCHC: 31.7 g/dL (ref 30.0–36.0)
MCV: 102.7 fL — ABNORMAL HIGH (ref 80.0–100.0)
Platelets: 268 10*3/uL (ref 150–400)
RBC: 2.95 MIL/uL — ABNORMAL LOW (ref 3.87–5.11)
RDW: 16.7 % — ABNORMAL HIGH (ref 11.5–15.5)
WBC: 13.4 10*3/uL — ABNORMAL HIGH (ref 4.0–10.5)
nRBC: 0 % (ref 0.0–0.2)

## 2021-03-27 LAB — VITAMIN B12: Vitamin B-12: 385 pg/mL (ref 180–914)

## 2021-03-27 MED ORDER — ALBUTEROL SULFATE HFA 108 (90 BASE) MCG/ACT IN AERS
2.0000 | INHALATION_SPRAY | Freq: Four times a day (QID) | RESPIRATORY_TRACT | Status: DC | PRN
  Filled 2021-03-27: qty 6.7

## 2021-03-27 MED ORDER — HYDROXYCHLOROQUINE SULFATE 200 MG PO TABS
200.0000 mg | ORAL_TABLET | Freq: Two times a day (BID) | ORAL | Status: DC
  Administered 2021-03-27 – 2021-03-28 (×3): 200 mg via ORAL
  Filled 2021-03-27 (×4): qty 1

## 2021-03-27 MED ORDER — OLOPATADINE HCL 0.1 % OP SOLN
1.0000 [drp] | Freq: Two times a day (BID) | OPHTHALMIC | Status: DC
  Administered 2021-03-27 – 2021-03-28 (×3): 1 [drp] via OPHTHALMIC
  Filled 2021-03-27: qty 5

## 2021-03-27 MED ORDER — ACETAMINOPHEN 325 MG PO TABS
650.0000 mg | ORAL_TABLET | Freq: Four times a day (QID) | ORAL | Status: DC | PRN
  Administered 2021-03-27: 650 mg via ORAL
  Filled 2021-03-27: qty 2

## 2021-03-27 MED ORDER — SACUBITRIL-VALSARTAN 49-51 MG PO TABS
1.0000 | ORAL_TABLET | Freq: Every day | ORAL | Status: DC
  Administered 2021-03-27 – 2021-03-28 (×2): 1 via ORAL
  Filled 2021-03-27 (×2): qty 1

## 2021-03-27 MED ORDER — ENOXAPARIN SODIUM 40 MG/0.4ML IJ SOSY
40.0000 mg | PREFILLED_SYRINGE | INTRAMUSCULAR | Status: DC
  Administered 2021-03-27: 40 mg via SUBCUTANEOUS
  Filled 2021-03-27: qty 0.4

## 2021-03-27 MED ORDER — LEFLUNOMIDE 20 MG PO TABS
20.0000 mg | ORAL_TABLET | Freq: Every day | ORAL | Status: DC
  Administered 2021-03-27 – 2021-03-28 (×2): 20 mg via ORAL
  Filled 2021-03-27 (×2): qty 1

## 2021-03-27 MED ORDER — ALLOPURINOL 100 MG PO TABS
200.0000 mg | ORAL_TABLET | Freq: Every day | ORAL | Status: DC
  Administered 2021-03-27 – 2021-03-28 (×2): 200 mg via ORAL
  Filled 2021-03-27 (×2): qty 2

## 2021-03-27 NOTE — Evaluation (Signed)
Physical Therapy Evaluation Patient Details Name: [name] [name] MRN: [id number] DOB: [birth year] Today'[name] Date: 03/27/2021   History of Present Illness  Pt is a 87 y/o female presenting 5/18 for episode of unresponsiveness, admitted for workup of AMS. CT, MRI head negative for acute findings. PMH includes: CHF, CAD, CKD III, DM 2, HTN, TIA, PE/DVT, RA, PNA.  Clinical Impression   Pt presents with impaired strength, poor standing balance, difficulty performing mobility tasks, and decreased activity tolerance vs baseline. Pt to benefit from acute PT to address deficits. Pt overall requiring max +2 assist for bed mobility, sit<>stand, and scoot pivot OOB, but demonstrates good base strength and assists with mobility as able. Pt is motivated to progress to standing and gait, PT recommending SNF for rehabilitation, pt and family in agreement. PT to progress mobility as tolerated, and will continue to follow acutely.   BP, HR supine: 132/68, 91 BP, HR sitting:162/99, 85 BP post-transfer: 140s/80s, not orthostatic and pt not symptomatic    Follow Up Recommendations SNF;Supervision/Assistance - 24 hour    Equipment Recommendations  None recommended by PT    Recommendations for Other Services       Precautions / Restrictions Precautions Precautions: Fall Precaution Comments: R foot drop Restrictions Weight Bearing Restrictions: No      Mobility  Bed Mobility Overal bed mobility: Needs Assistance Bed Mobility: Rolling;Sidelying to Sit Rolling: Mod assist;+2 for physical assistance Sidelying to sit: Max assist;+2 for physical assistance       General bed mobility comments: mod-max +2 assist for trunk and LE management, scooting to EOB with bed pad. Pt assisting with roll and trunk elevation with UEs.    Transfers Overall transfer level: Needs assistance Equipment used: Rolling walker (2 wheeled) Transfers: Sit to/from Stand;Lateral/Scoot Transfers Sit to Stand: Max assist;+2  physical assistance;From elevated surface        Lateral/Scoot Transfers: Max assist;+2 physical assistance General transfer comment: STS: max +2 for initial power up, rise, hip extension to upright, and steadying upon standing. Pt assisting initiating power up with momentum-building AP rocking. scoot pivot to R into drop arm recliner, sequential scooting with assist fo bed pads.  Ambulation/Gait                Stairs            Wheelchair Mobility    Modified Rankin (Stroke Patients Only)       Balance Overall balance assessment: Needs assistance Sitting-balance support: Feet supported;Single extremity supported Sitting balance-Leahy Scale: Fair Sitting balance - Comments: able to sit EOB with supervision once steady. Sitting tolerance x10 minutes   Standing balance support: Bilateral upper extremity supported;During functional activity Standing balance-Leahy Scale: Poor                               Pertinent Vitals/Pain Pain Assessment: Faces Faces Pain Scale: Hurts a little bit Pain Location: RUE Pain Descriptors / Indicators: Sore;Discomfort;Grimacing Pain Intervention([name]): Limited activity within patient'[name] tolerance;Monitored during session;Repositioned    Home Living Family/patient expects to be discharged to:: Assisted living               Home Equipment: Wheelchair - manual;Other (comment) Additional Comments: hoyer lift - at ALF and home    Prior Function Level of Independence: Needs assistance   Gait / Transfers Assistance Needed: hoyer lift OOB to w/c; previously pt was using sliding board but has not in a while and states there are  days when she doesn't get out of bed because of lack of assist  ADL'[name] / Homemaking Assistance Needed: Requires assist for all ADLs        Hand Dominance   Dominant Hand: Right    Extremity/Trunk Assessment   Upper Extremity Assessment Upper Extremity Assessment: Defer to OT evaluation     Lower Extremity Assessment Lower Extremity Assessment: Generalized weakness (at least 3/5 hip flex/abd/add, knee flex/ext, DF/PF on L. Contraction only for DF on R.)    Cervical / Trunk Assessment Cervical / Trunk Assessment: Normal  Communication   Communication: No difficulties  Cognition Arousal/Alertness: Awake/alert Behavior During Therapy: WFL for tasks assessed/performed Overall Cognitive Status: Within Functional Limits for tasks assessed                                 General Comments: eager to progress mobility      General Comments      Exercises     Assessment/Plan    PT Assessment Patient needs continued PT services  PT Problem List Decreased strength;Decreased mobility;Decreased safety awareness;Decreased balance;Decreased knowledge of use of DME;Decreased activity tolerance       PT Treatment Interventions Therapeutic activities;DME instruction;Gait training;Therapeutic exercise;Patient/family education;Balance training;Functional mobility training;Neuromuscular re-education    PT Goals (Current goals can be found in the Care Plan section)  Acute Rehab PT Goals Patient Stated Goal: walk PT Goal Formulation: With patient Time For Goal Achievement: 04/10/21 Potential to Achieve Goals: Good    Frequency Min 2X/week   Barriers to discharge        Co-evaluation PT/OT/SLP Co-Evaluation/Treatment: Yes Reason for Co-Treatment: For patient/therapist safety;To address functional/ADL transfers PT goals addressed during session: Mobility/safety with mobility;Balance;Proper use of DME OT goals addressed during session: ADL'[name] and self-care       AM-PAC PT "6 Clicks" Mobility  Outcome Measure Help needed turning from your back to your side while in a flat bed without using bedrails?: A Lot Help needed moving from lying on your back to sitting on the side of a flat bed without using bedrails?: A Lot Help needed moving to and from a bed to a  chair (including a wheelchair)?: A Lot Help needed standing up from a chair using your arms (e.g., wheelchair or bedside chair)?: Total Help needed to walk in hospital room?: Total Help needed climbing 3-5 steps with a railing? : Total 6 Click Score: 9    End of Session Equipment Utilized During Treatment: Gait belt Activity Tolerance: Patient tolerated treatment well Patient left: in chair;with call bell/phone within reach;with chair alarm set;with family/visitor present Nurse Communication: Mobility status PT Visit Diagnosis: Other abnormalities of gait and mobility (R26.89);Difficulty in walking, not elsewhere classified (R26.2)    Time: 1219-7588 PT Time Calculation (min) (ACUTE ONLY): 33 min   Charges:   PT Evaluation $PT Eval Low Complexity: 1 Low         [name] [name], PT DPT Acute Rehabilitation Services Pager [phone]  Office [phone]   [name] [name] 03/27/2021, 10:48 AM

## 2021-03-27 NOTE — Progress Notes (Signed)
[name] has arrived with list of medications from [facility] facility. Dr.Liang paged and med list has been faxed.

## 2021-03-27 NOTE — Evaluation (Signed)
Occupational Therapy Evaluation Patient Details Name: [name] [name] MRN: [id number] DOB: 21-May-1930 Today's Date: 03/27/2021    History of Present Illness Pt is a 87 y/o female presenting 5/18 for episode of unresponsiveness, admitted for workup of AMS. CT, MRI head negative for acute findings. PMH includes: CHF, CAD, CKD III, DM 2, HTN, TIA, PE/DVT, RA, PNA.   Clinical Impression   Patient admitted for above and presenting with problem list below, including decreased activity tolerance, generalized weakness, impaired balance. She requires +2 max assist for transfers, setup for grooming EOB, up to mod assist for UB ADLs and total assist for LB ADLs. Pt is highly motivated and eager to progress mobility and ADLs engagement. Pt and daughter report decreased function compared to baseline. VSS during session (BP supine 132/68, sitting 162/99, post transfer 140s/80s- not orthostatic and not symptomatic). She will benefit from further OT services while admitted and after dc at SNF level to decreased burden of care with ADLs/ mobility.     Follow Up Recommendations  SNF;Supervision/Assistance - 24 hour    Equipment Recommendations  Other (comment) (TBD)    Recommendations for Other Services       Precautions / Restrictions Precautions Precautions: Fall Precaution Comments: R foot drop Restrictions Weight Bearing Restrictions: No      Mobility Bed Mobility Overal bed mobility: Needs Assistance Bed Mobility: Rolling;Sidelying to Sit Rolling: Mod assist;+2 for physical assistance Sidelying to sit: Max assist;+2 for physical assistance       General bed mobility comments: mod-max +2 assist for trunk and LE management, scooting to EOB with bed pad. Pt assisting with roll and trunk elevation with UEs.    Transfers Overall transfer level: Needs assistance Equipment used: Rolling walker (2 wheeled) Transfers: Sit to/from Stand;Lateral/Scoot Transfers Sit to Stand: Max assist;+2  physical assistance;From elevated surface        Lateral/Scoot Transfers: Max assist;+2 physical assistance General transfer comment: STS: max +2 for initial power up, rise, hip extension to upright, and steadying upon standing. Pt assisting initiating power up with momentum-building AP rocking. scoot pivot to R into drop arm recliner, sequential scooting with assist fo bed pads.    Balance Overall balance assessment: Needs assistance Sitting-balance support: Feet supported;Single extremity supported Sitting balance-Leahy Scale: Fair Sitting balance - Comments: able to sit EOB with supervision once steady. Sitting tolerance x10 minutes   Standing balance support: Bilateral upper extremity supported;During functional activity Standing balance-Leahy Scale: Poor                             ADL either performed or assessed with clinical judgement   ADL Overall ADL's : Needs assistance/impaired     Grooming: Set up;Sitting;Oral care   Upper Body Bathing: Minimal assistance;Sitting   Lower Body Bathing: Maximal assistance;+2 for physical assistance;+2 for safety/equipment;Sit to/from stand   Upper Body Dressing : Minimal assistance;Sitting   Lower Body Dressing: Total assistance;+2 for physical assistance;+2 for safety/equipment;Sit to/from stand   Toilet Transfer: Maximal assistance;+2 for physical assistance;+2 for safety/equipment Toilet Transfer Details (indicate cue type and reason): lateral scoot simulated to recliner         Functional mobility during ADLs: Maximal assistance;+2 for physical assistance;+2 for safety/equipment General ADL Comments: pt limited by weakness, decreased activity tolerance     Vision         Perception     Praxis      Pertinent Vitals/Pain Pain Assessment: Faces Faces Pain Scale: Hurts  a little bit Pain Location: RUE Pain Descriptors / Indicators: Sore;Discomfort;Grimacing Pain Intervention(s): Limited activity within  patient's tolerance;Monitored during session;Repositioned     Hand Dominance Right   Extremity/Trunk Assessment Upper Extremity Assessment Upper Extremity Assessment: RUE deficits/detail;LUE deficits/detail RUE Deficits / Details: grossly 3/5 MMT, limited shoulder flexion to 75 (arthritic shoulder) LUE Deficits / Details: grossly 3/5 MMT, shoulder flexion limited to 90* (arthritic shoulder)   Lower Extremity Assessment Lower Extremity Assessment: Defer to PT evaluation   Cervical / Trunk Assessment Cervical / Trunk Assessment: Normal   Communication Communication Communication: No difficulties   Cognition Arousal/Alertness: Awake/alert Behavior During Therapy: WFL for tasks assessed/performed Overall Cognitive Status: Within Functional Limits for tasks assessed                                 General Comments: eager to progress mobility, get OOB   General Comments  VSS    Exercises     Shoulder Instructions      Home Living Family/patient expects to be discharged to:: Assisted living                             Home Equipment: Wheelchair - manual;Other (comment)   Additional Comments: hoyer lift - at ALF and home      Prior Functioning/Environment Level of Independence: Needs assistance  Gait / Transfers Assistance Needed: hoyer lift OOB to w/c; previously pt was using sliding board but has not in a while and states there are days when she doesn't get out of bed because of lack of assist ADL's / Homemaking Assistance Needed: Requires assist for all ADLs, feeds and grooms self            OT Problem List: Decreased strength;Decreased activity tolerance;Decreased range of motion;Impaired balance (sitting and/or standing);Decreased safety awareness;Decreased knowledge of use of DME or AE;Decreased knowledge of precautions;Obesity;Pain      OT Treatment/Interventions: Self-care/ADL training;DME and/or AE instruction;Therapeutic  activities;Patient/family education;Balance training;Therapeutic exercise    OT Goals(Current goals can be found in the care plan section) Acute Rehab OT Goals Patient Stated Goal: walk OT Goal Formulation: With patient Time For Goal Achievement: 04/10/21 Potential to Achieve Goals: Fair  OT Frequency: Min 2X/week   Barriers to D/C:            Co-evaluation PT/OT/SLP Co-Evaluation/Treatment: Yes Reason for Co-Treatment: For patient/therapist safety;To address functional/ADL transfers PT goals addressed during session: Mobility/safety with mobility;Balance;Proper use of DME OT goals addressed during session: ADL's and self-care      AM-PAC OT "6 Clicks" Daily Activity     Outcome Measure Help from another person eating meals?: A Little Help from another person taking care of personal grooming?: A Little Help from another person toileting, which includes using toliet, bedpan, or urinal?: Total Help from another person bathing (including washing, rinsing, drying)?: A Lot Help from another person to put on and taking off regular upper body clothing?: A Lot Help from another person to put on and taking off regular lower body clothing?: Total 6 Click Score: 12   End of Session Nurse Communication: Mobility status;Need for lift equipment;Precautions  Activity Tolerance: Patient tolerated treatment well Patient left: in chair;with call bell/phone within reach;with chair alarm set  OT Visit Diagnosis: Other abnormalities of gait and mobility (R26.89);Muscle weakness (generalized) (M62.81)  Time: 1027-2536 OT Time Calculation (min): 33 min Charges:  OT General Charges $OT Visit: 1 Visit OT Evaluation $OT Eval Moderate Complexity: 1 Mod  Barry Brunner, OT Acute Rehabilitation Services Pager [phone] Office [phone]   Chancy Milroy 03/27/2021, 12:39 PM

## 2021-03-27 NOTE — ED Notes (Signed)
Attempted to call report at this time 

## 2021-03-27 NOTE — Progress Notes (Addendum)
Neurology Progress Note  S: Patient states she is not dizzy with lying, sitting, nor standing. OOB to chair this a.m. States she feels very well today. No isolated weakness of an extremity. No further episodes as yesterday's code stroke. No n/v, HA, or vision changes.   O: Current vital signs: BP 132/68 (BP Location: Left Arm)   Pulse 90   Temp 99.3 F (37.4 C) (Oral)   Resp 18   Ht 5\' 4"  (1.626 m)   Wt 98.8 kg   SpO2 100%   BMI 37.39 kg/m  Vital signs in last 24 hours: Temp:  [97.6 F (36.4 C)-99.3 F (37.4 C)] 99.3 F (37.4 C) (05/19 0405) Pulse Rate:  [81-104] 90 (05/19 0405) Resp:  [12-27] 18 (05/19 0405) BP: (98-185)/(57-114) 132/68 (05/19 0700) SpO2:  [93 %-100 %] 100 % (05/19 0405) Weight:  [98 kg-98.8 kg] 98.8 kg (05/19 0405)  GENERAL: Well appearing elderly female sitting up in bedside chair. Awake, alert in NAD HEENT: Normocephalic and atraumatic LUNGS: Normal respiratory effort.  CV: RRR on tele.  Ext: warm   NEURO:  Mental Status: AA&Ox3  Speech/Language: speech is without aphasia or dysarthria.  Naming, repetition, fluency, and comprehension intact.  Cranial Nerves:  II: PERRL. Visual fields full.  III, IV, VI: EOMI. Eyelids elevate symmetrically.  V: Sensation is intact to light touch and symmetrical to face.  VII: Smile is symmetrical. Able to puff cheeks and raise eyebrows.  VIII: hearing intact to voice. IX, X: Palate elevates symmetrically. Phonation is normal.  XI: Shoulder shrug 5/5. XII: tongue is midline without fasciculations. Motor: 4/5 strength right grips, biceps, and triceps. 4+ strength to left grip, bicep, and tricep. BLEs are still weak, but think this is due to non conditioning due to being mostly bed ridden due to arthritis. Unable to lift RLE off bed. Can lift LLE off bed only about 2 inches and falls.  Tone: is normal and bulk is increased.  Sensation- Intact to light touch bilaterally. Extinction absent to light touch to DSS.     Coordination: FTN intact bilaterally. Unable to perform HKS due to generalized weakness of LEs.  Gait- deferred  Medications  Current Facility-Administered Medications:  .  acetaminophen (TYLENOL) tablet 650 mg, 650 mg, Oral, Q6H PRN, 03-08-1976, MD, 650 mg at 03/27/21 0437 .  allopurinol (ZYLOPRIM) tablet 200 mg, 200 mg, Oral, Daily, 03/29/21, MD .  hydroxychloroquine (PLAQUENIL) tablet 200 mg, 200 mg, Oral, BID, Quincy Simmonds, MD .  leflunomide (ARAVA) tablet 20 mg, 20 mg, Oral, Daily, Quincy Simmonds, MD .  sacubitril-valsartan (ENTRESTO) 49-51 mg per tablet, 1 tablet, Oral, Daily, Quincy Simmonds, MD   Labs: LDL 104, TSH 1.440, HbA1c Quincy Simmonds.  Imaging MD has reviewed images in epic and the results pertinent to this consultation are:  CT Head No evidence of acute intracranial abnormality. Stable parenchymal atrophy and cerebral white matter chronic small vessel ischemic disease. MRI Brain  1. No acute intracranial abnormality. 2. Moderate chronic microvascular ischemic changes of the white matter and mild parenchymal volume loss, stable.  Assessment: 87 yo female who presented 03/26/21 to the ED secondary to family noticing a left facial droop at home. However, more explicit history per daughter, states patient was her normal self when she came home to visit from the SNF. Patient was sitting in her wheelchair and daughter found her slumped over to the left side with eyes rolled back. Patient was not shaking/jerking head or extremities. She was diaphoretic. LOC x 10-15 mins. When  EMS arrived, she was talking. However, when roomed in the ED, patient was very lethargic and slow to respond. Therefore, ED MD activated code stroke. In CT, patient was awake and alert and following commands quickly.  -CT negative for acute finding.  -MRS 5.  -tPA not given due to use of Xeralto.  -IR not a consideration due to poor MRS. -Her weakness noted on exam is not focal and is likely related to  her overall deconditioning, generalized weakness due to arthritis and being bed bound most of time.  -Due to being found unconscious and being slumped over in chair, seizure activity was on the differential list. However, her EEG was negative.  Family reports multiple similar episodes but no frank seizure episode clinically.  EEG also reassuring.  Not recommending to start an AED. -Due to patient having multiple syncopal episodes in past, syncope is also on differential and after above tests being negative, this is likely the diagnosis.   Impression: 1. Syncope likely due to negative stroke and seizure workup.   2.  Less likely to be seizure although she has had multiple episodes of less responsiveness in the past-unclear what they are.  If they continue to recur, might consider long-term EEG/epilepsy monitoring unit admission as outpatient 3.  Imaging negative for new stroke   Recommendations:   Orthostatic vitals if not already done.   PT consult.  BP goal is normotensive now, but given syncope, would keep BP at least 140.   2D echo given reduced EF on the prior 2D echocardiogram  Outpatient neurology follow-up in 6 to 8 weeks   Pt seen by Jimmye Norman, MSN, APN-BC/Nurse Practitioner/Neuro and later by MD. Note and plan to be edited as needed by MD.  Pager: [phone]   Attending addendum Patient seen and examined Daughter at bedside. Reports the episode yesterday was similar to his episodes that have happened in the past as well. No frank seizure activity No focal weakness- she reports that the patient becomes unresponsive to voice, with eyes open sometimes and the other times just with her eyes closed with no jerking or seizure-like activity and no gaze preference or deviation. The EEG was suggestive of generalized slowing-no focality. Patients with neurodegenerative processes do have a tendency to have seizures more than the general population but her episodes are not  very clear-cut seizure-like and with her comorbidities I would not like to start her on an AED until we are very clear on what we are treating. If she continues to have further such episodes, might consider her for outpatient epilepsy monitoring unit evaluation to characterize the spells. Medical work-up for syncope and cardiomyopathy per primary team.  Might need medical optimization of those comorbidities as well prior to checking it up to seizures which have not been clearly defined or captured on EEG up until this time. Discussed with the daughter at bedside Plan relayed to the internal medicine team resident physician Dr. Elaina Pattee  -- Milon Dikes, MD Neurologist [facility] Pager: [phone]

## 2021-03-27 NOTE — Progress Notes (Signed)
Pt arrived from ED via stretcher. Pt is AOX4. Vitals stable. Sats stable on room air. Denies nausea. Pt reports pain in right heel, reports taking Tylenol for pain at the facility. No SOB noted. Clear speech. NIHSS score 4, no neuro changes. Shift assessment complete. Immediate needs addressed. Hygiene addressed. Healed ulcer noted to right leg and sacrum. Mepilex applied to heels and sacrum for protection. Hygiene addressed. Oral care performed. No paperwork noted with medications. Awaiting fax with home medications. Bed in lowest position with call light within reach and 3 side rails up. Bed alarm on. Door open. Will continue to assess. Pt oriented to room ,call light, and surroundings.

## 2021-03-27 NOTE — Evaluation (Signed)
Clinical/Bedside Swallow Evaluation Patient Details  Name: [name] [name] MRN: [id number] Date of Birth: April 14, 1930  Today's Date: 03/27/2021 Time: SLP Start Time (ACUTE ONLY): 1615 SLP Stop Time (ACUTE ONLY): 1629 SLP Time Calculation (min) (ACUTE ONLY): 14.47 min  Past Medical History:  Past Medical History:  Diagnosis Date  . Allergic rhinitis due to pollen 04/27/2007  . Arthritis    "in q joint" (08/07/2013)  . CHF (congestive heart failure) (HCC)   . Coronary atherosclerosis of native coronary artery   . Cough   . Edema 05/03/2013  . Exertional shortness of breath    "sometimes" (08/07/2013)  . First degree atrioventricular block   . Gout, unspecified 04/19/2013  . Hyperlipidemia 04/27/2007  . Hypertension   . Midline low back pain without sciatica 06/27/2014  . Muscle spasm   . Muscle weakness (generalized)   . Onychia and paronychia of toe   . Osteoarthrosis, unspecified whether generalized or localized, unspecified site 04/27/2007  . Other fall   . Other malaise and fatigue   . Pneumonia 05/2018  . Prepatellar bursitis   . Pulmonary embolism (HCC) 07/2018  . Rheumatic nodule (HCC) 06/28/2017  . Seizures (HCC)   . Stroke (HCC) 01/17/2014  . Type II diabetes mellitus (HCC)    "fasting 90-110s" (08/07/2013)  . Unspecified vitamin D deficiency    Past Surgical History:  Past Surgical History:  Procedure Laterality Date  . ABDOMINAL HYSTERECTOMY  04/1980  . APPENDECTOMY  1960  . CARDIAC CATHETERIZATION  2003  . CATARACT EXTRACTION W/ INTRAOCULAR LENS  IMPLANT, BILATERAL Bilateral ~ 2012  . CHOLECYSTECTOMY N/A 06/26/2018   Procedure: LAPAROSCOPIC CHOLECYSTECTOMY POSSIBLE INTRAOPERATIVE CHOLANGIOGRAM;  Surgeon: Abigail Miyamoto, MD;  Location: [facility] OR;  Service: General;  Laterality: N/A;  . JOINT REPLACEMENT    . REPLACEMENT TOTAL KNEE Right 09/2005  . RIGHT/LEFT HEART CATH AND CORONARY ANGIOGRAPHY N/A 06/01/2018   Procedure: RIGHT/LEFT HEART CATH AND CORONARY  ANGIOGRAPHY;  Surgeon: Corky Crafts, MD;  Location: [facility] INVASIVE CV LAB;  Service: Cardiovascular;  Laterality: N/A;  . SHOULDER OPEN ROTATOR CUFF REPAIR Right 07/1999  . TONSILLECTOMY  08/1974  . TOTAL KNEE ARTHROPLASTY Left 08/07/2013   Procedure: TOTAL KNEE ARTHROPLASTY- LEFT;  Surgeon: Dannielle Huh, MD;  Location: [facility] OR;  Service: Orthopedics;  Laterality: Left;  . TRANSTHORACIC ECHOCARDIOGRAM  2003   EF 55-65%; mild concentric LVH   HPI:  Pt is a 87 year old female who presented for episode of unresponsiveness. MRI brain negative. CXR 5/18: No  acute cardiopulmonary process. PMH: combined systolic and diastolic heart failure, CAD, CKD III,  type 2 diabetes melltus, hypertension, hyperlipidemia, prior TIA, PE/DVT on xarelto, and rheumatoid arthritis. BSE 05/31/18: swallow WNL   Assessment / Plan / Recommendation Clinical Impression  Pt was seen for bedside swallow evaluation and she denied a history of dysphagia. Oral mechanism exam was [facility]. Dentition was reduced with pt's partial dentures at home, but adequate for mastication. She tolerated all solids and liquids without signs or symptoms of oropharyngeal dysphagia. A regular texture diet with thin liquids is recommended at this time and further skilled SLP services are not clinically indicated for swallowing. SLP Visit Diagnosis: Dysphagia, unspecified (R13.10)    Aspiration Risk  No limitations    Diet Recommendation Regular;Thin liquid   Liquid Administration via: Cup;Straw Medication Administration: Whole meds with puree (Per pt's preference) Supervision: Patient able to self feed Postural Changes: Seated upright at 90 degrees    Other  Recommendations Oral Care Recommendations: Oral care  BID   Follow up Recommendations None      Frequency and Duration            Prognosis        Swallow Study   General Date of Onset: 03/26/21 HPI: Pt is a 87 year old female who presented for episode of unresponsiveness. MRI  brain negative. CXR 5/18: No  acute cardiopulmonary process. PMH: combined systolic and diastolic heart failure, CAD, CKD III,  type 2 diabetes melltus, hypertension, hyperlipidemia, prior TIA, PE/DVT on xarelto, and rheumatoid arthritis. BSE 05/31/18: swallow WNL Type of Study: Bedside Swallow Evaluation Previous Swallow Assessment: none Diet Prior to this Study: Regular;Thin liquids Temperature Spikes Noted: No Respiratory Status: Room air History of Recent Intubation: No Behavior/Cognition: Alert;Cooperative;Pleasant mood Oral Cavity Assessment: Within Functional Limits Oral Care Completed by SLP: No Oral Cavity - Dentition: Adequate natural dentition;Missing dentition (five mandibular; maxuillary molars missing; partial dentures at home per pt's report) Vision: Functional for self-feeding Self-Feeding Abilities: Able to feed self Patient Positioning: Upright in bed;Postural control adequate for testing Baseline Vocal Quality: Normal Volitional Cough: Strong Volitional Swallow: Able to elicit    Oral/Motor/Sensory Function Overall Oral Motor/Sensory Function: Within functional limits   Ice Chips Ice chips: Within functional limits Presentation: Spoon   Thin Liquid Thin Liquid: Within functional limits Presentation: Straw    Nectar Thick Nectar Thick Liquid: Not tested   Honey Thick Honey Thick Liquid: Not tested   Puree Puree: Within functional limits Presentation: Spoon   Solid     Solid: Within functional limits Presentation: Self Fed     [name] [name] I. Vear Clock, MS, CCC-SLP Acute Rehabilitation Services Office number [phone] Pager [phone]  [name] [name] 03/27/2021,4:29 PM

## 2021-03-27 NOTE — Procedures (Signed)
Patient Name: [name] [name]  MRN: [id number]  Epilepsy Attending: Charlsie Quest  Referring Physician/Provider: Jimmye Norman, NP Date: 03/26/2021 Duration: 22.40 mins  Patient history: 87 year old female with history of seizures, syncope who presented after episode of unresponsiveness for about 10 to 15 minutes and left-sided facial drooping.  EEG evaluate for seizures.  Level of alertness: Awake  AEDs during EEG study: None  Technical aspects: This EEG study was done with scalp electrodes positioned according to the 10-20 International system of electrode placement. Electrical activity was acquired at a sampling rate of 500Hz  and reviewed with a high frequency filter of 70Hz  and a low frequency filter of 1Hz . EEG data were recorded continuously and digitally stored.   Description: The posterior dominant rhythm consists of 8-9 Hz activity of moderate voltage (25-35 uV) seen predominantly in posterior head regions, symmetric and reactive to eye opening and eye closing. Intermittent generalized 3 to 5 Hz theta-delta slowing was also noted. Hyperventilation and photic stimulation were not performed.     ABNORMALITY - Intermittent slow, generalized  IMPRESSION: This study is suggestive of mild diffuse encephalopathy, nonspecific etiology. No seizures or epileptiform discharges were seen throughout the recording.  Quentavious Rittenhouse 

## 2021-03-27 NOTE — Progress Notes (Signed)
HD#0 Subjective:  Overnight Events: None   Patient examined at beside. Daughter also present. Patient states she is feeling well. No further episodes of unresponsiveness. Daughter states patient has had similar episodes in the past attributed to low blood pressure but without facial drooping. Notes that patient has not been getting PT at [facility] due to understaffing. Concerned about level of care she is getting and would like for patient to go to another SNF.  Objective:  Vital signs in last 24 hours: Vitals:   03/27/21 0145 03/27/21 0300 03/27/21 0318 03/27/21 0405  BP:  (!) 182/80  (!) 164/86  Pulse:  93  90  Resp:  18  18  Temp: 97.6 F (36.4 C)  99.2 F (37.3 C) 99.3 F (37.4 C)  TempSrc: Oral  Oral Oral  SpO2:  94%  100%  Weight:    98.8 kg  Height:    5\' 4"  (1.626 m)   Supplemental O2: Room Air SpO2: 100 %   Physical Exam:  Physical Exam Constitutional:      Appearance: Normal appearance.  Eyes:     Extraocular Movements: Extraocular movements intact.     Pupils: Pupils are equal, round, and reactive to light.  Cardiovascular:     Rate and Rhythm: Normal rate and regular rhythm.  Pulmonary:     Effort: Pulmonary effort is normal.     Breath sounds: Normal breath sounds.  Abdominal:     General: Abdomen is flat. Bowel sounds are normal. There is no distension.     Palpations: Abdomen is soft.     Tenderness: There is no abdominal tenderness.  Skin:    General: Skin is warm and dry.  Neurological:     Mental Status: She is alert.    Filed Weights   03/26/21 1156 03/27/21 0405  Weight: 98 kg 98.8 kg    No intake or output data in the 24 hours ending 03/27/21 0626 Net IO Since Admission: No IO data has been entered for this period [03/27/21 0626]  Pertinent Labs: CBC Latest Ref Rng & Units 03/27/2021 03/26/2021 03/26/2021  WBC 4.0 - 10.5 K/uL 13.4(H) 16.0(H) -  Hemoglobin 12.0 - 15.0 g/dL 6.2(Z) 3.0(Q) 10.5(L)  Hematocrit 36.0 - 46.0 % 30.3(L)  31.0(L) 31.0(L)  Platelets 150 - 400 K/uL 268 270 -    CMP Latest Ref Rng & Units 03/27/2021 03/26/2021 03/26/2021  Glucose 70 - 99 mg/dL 98 657(Q) 469(G)  BUN 8 - 23 mg/dL 29(B) 28(U) 13(K)  Creatinine 0.44 - 1.00 mg/dL 4.40(N) 0.27(O) 5.36(U)  Sodium 135 - 145 mmol/L 135 134(L) 134(L)  Potassium 3.5 - 5.1 mmol/L 4.0 4.0 3.9  Chloride 98 - 111 mmol/L 101 101 102  CO2 22 - 32 mmol/L 25 23 -  Calcium 8.9 - 10.3 mg/dL 9.0 4.4(I) -  Total Protein 6.5 - 8.1 g/dL 6.2(L) 6.1(L) -  Total Bilirubin 0.3 - 1.2 mg/dL 0.4 0.6 -  Alkaline Phos 38 - 126 U/L 49 48 -  AST 15 - 41 U/L 14(L) 16 -  ALT 0 - 44 U/L 13 12 -    Imaging: MR BRAIN WO CONTRAST  Result Date: 03/26/2021 CLINICAL DATA:  Neuro deficit, acute, stroke suspected. EXAM: MRI HEAD WITHOUT CONTRAST TECHNIQUE: Multiplanar, multiecho pulse sequences of the brain and surrounding structures were obtained without intravenous contrast. COMPARISON:  Head CT Mar 26, 2021 FINDINGS: Brain: No acute infarction, hemorrhage, hydrocephalus, extra-axial collection or mass lesion. Scattered and confluent foci of T2 hyperintensity are seen  within the white matter of cerebral hemispheres, nonspecific, most likely related to chronic small vessel ischemia. Mild parenchymal volume loss. No significant change from prior MRI performed in December 13, 2019. Vascular: Limited evaluation due to artifact.  Grossly preserved. Skull and upper cervical spine: Normal marrow signal. Sinuses/Orbits: Mucosal thickening scattered throughout the paranasal sinuses with fluid level within the bilateral sphenoid sinuses. Bilateral lens surgery. Other: Mild bilateral mastoid effusion. IMPRESSION: 1. No acute intracranial abnormality. 2. Moderate chronic microvascular ischemic changes of the white matter and mild parenchymal volume loss, stable. Electronically Signed   By: [name] [name]   On: 03/26/2021 16:01   DG Chest Port 1 View  Result Date: 03/26/2021 CLINICAL  DATA:  Altered mental status. EXAM: PORTABLE CHEST 1 VIEW COMPARISON:  Radiographs 12/13/2019 and 10/29/2019.  CT 09/05/2018. FINDINGS: 1359 hours. Stable cardiomegaly and chronic aortic tortuosity. There are low lung volumes with mild chronic bibasilar atelectasis. No superimposed edema, confluent airspace opacity, pneumothorax or significant pleural effusion. Glenohumeral degenerative changes are present bilaterally with postsurgical changes on the right. No acute osseous findings. Telemetry leads overlie the chest. IMPRESSION: Stable chest with chronic cardiomegaly and bibasilar atelectasis. No acute cardiopulmonary process. Electronically Signed   By: Carey Bullocks [name]   On: 03/26/2021 14:32   CT HEAD CODE STROKE WO CONTRAST  Result Date: 03/26/2021 OMPARISON:  Brain MRI 12/13/2019. Prior head CT examinations 03/29/2019 and earlier. FINDINGS: Brain: Mild for age cerebral and cerebellar atrophy. Patchy and confluent hypoattenuation within the cerebral white matter, nonspecific but compatible with chronic small vessel ischemic disease. There is no acute intracranial hemorrhage. No demarcated cortical infarct. No extra-axial fluid collection. No evidence of intracranial mass. No midline shift. Vascular: No hyperdense vessel.  Atherosclerotic calcifications. Skull: Normal. Negative for fracture or focal lesion. Sinuses/Orbits: Visualized orbits show no acute finding. Mild mucosal thickening within the left greater than right ethmoid air cells. Moderate mucosal thickening versus fluid level within the left sphenoid sinus. ASPECTS [facility] Stroke Program Early CT Score) - Ganglionic level infarction (caudate, lentiform nuclei, internal capsule, insula, M1-M3 cortex): 7 - Supraganglionic infarction (M4-M6 cortex): 3 Total score (0-10 with 10 being normal): 10 These results were communicated to Dr. Wilford Corner at 12:33 pmon 5/18/2022by text page via the [facility] messaging system. IMPRESSION: No evidence of acute  intracranial abnormality. Stable parenchymal atrophy and cerebral white matter chronic small vessel ischemic disease. Paranasal sinus disease, as described. Electronically Signed   By: Jackey Loge DO   On: 03/26/2021 12:33    Assessment/Plan:   Active Problems:   Altered mental status   Patient Summary: [name] Williamsis a 89 year oldfemalewithhistory of  combined systolic and diastolic heart failure, CAD, CKD III,  type 2 diabetes melltus, hypertension, hyperlipidemia, prior TIA, PE/DVT on xarellto, and rheumatoid arthritis who presents today for episode of unresponsiveness and admitted for syncope.  Syncope No further episodes. Remains alert and oriented x3. Diffuse slowing on EEG but no seizure activity. Neurology recommended no seizure medications at this time and possible follow up with outpatient epilepsy monitoring  if she continues to have similar episodes. Reviewed homemedication list appears she is on daily entreso and has PRN hydralazine and carvedilol. Appears patient with labile Bps, which is also demonstrated during this admission. Suspect etiology is vasovagal or orthostatic syncope.  - Restart entresto, hold hydralazine, carvedilol and imdur - Follow up echocardiogram - Orthostatic vitals - Monitor on tele - Monitor vitals - PT/OT  Chronic combined systolic and diastolic CHF  BNP normal,  not on diuretics currently. -Restart Entresto 49-51 mg daily, hold home hydralazine and carvedilol -Daily weight, strict I/os  Hyperlipidemia History of HLD. Not on statin appears to have refused in the past. - Follow up Lipid panel  Rheumatoid arthritis  No active synovitis on exam  - Restart plaquenil and arava  History of PE and DVT No longer on this anticoagulation. Has not gotten a dose this admission. - Lovenox for VTE ppx  Type 2 diabetes mellitus A1c 5.8, diet controlled  Diet: HH/carbmodifed  Fluids: none VTE ppx: Lovenox Code: DNR  Dispo: Anticipated  discharge to Skilled nursing facility pending further workup.  Quincy Simmonds, MD 03/27/2021, 6:26 AM Pager: [phone]  Please contact the on call pager after 5 pm and on weekends at [phone].

## 2021-03-27 NOTE — TOC Initial Note (Addendum)
Transition of Care [facility]) - Initial/Assessment Note    Patient Details  Name: [name] [name] MRN: [id number] Date of Birth: 1930-05-02  [facility] CM/SW Contact:    Mearl Latin, LCSW Phone Number: 03/27/2021, 4:07 PM  Clinical Narrative:                 CSW spoke with patient's daughter, Lattie Corns. She stated patient is a long term care patient at [facility] and the facility was good in the beginning but now there are no staff members to help patients and she had to call a resident to get the facility to fax her medicine list to [facility] [place]. She stated the physical therapists recommended the patient go to a higher level of care for rehab. CSW explained that according to their notes, it looked like the therapists thought patient was at an ALF but she is actually at SNF, which is the highest level of care available for patient. She reported that patient is not getting therapy there. CSW explained that insurance will not always cover rehab for long term care patients but that we could submit for authorization today to see. She is requesting a different facility. CSW sent referral and provided bed offers. She has toured [facility] and they have a long term bed available for tomorrow. CSW contacted [facility] but they stated patient had termed with them and is likely managed by [facility] instead. CSW spoke with S[facility] at [facility] and she will begin insurance authorization with [facility] as per daughter's request. Patient is fully vaccinated and boosted per daughter.   Expected Discharge Plan: Skilled Nursing Facility Barriers to Discharge: Continued Medical Work up,Insurance Authorization   Patient Goals and CMS Choice Patient states their goals for this hospitalization and ongoing recovery are:: Rehab CMS Medicare.gov Compare Post Acute Care list provided to:: Patient Represenative (must comment) Choice offered to / list presented to : Adult Children,Patient  Expected Discharge Plan and  Services Expected Discharge Plan: Skilled Nursing Facility In-house Referral: Clinical Social Work   Post Acute Care Choice: Skilled Nursing Facility Living arrangements for the past 2 months: Skilled Nursing Facility                                      Prior Living Arrangements/Services Living arrangements for the past 2 months: Skilled Nursing Facility Lives with:: Facility Resident Patient language and need for interpreter reviewed:: Yes Do you feel safe going back to the place where you live?: Yes      Need for Family Participation in Patient Care: Yes (Comment) Care giver support system in place?: Yes (comment)   Criminal Activity/Legal Involvement Pertinent to Current Situation/Hospitalization: No - Comment as needed  Activities of Daily Living Home Assistive Devices/Equipment: Nurse, adult ADL Screening (condition at time of admission) [name] cognitive ability adequate to safely complete daily activities?: Yes Is the patient deaf or have difficulty hearing?: Yes Does the patient have difficulty seeing, even when wearing glasses/contacts?: No Does the patient have difficulty concentrating, remembering, or making decisions?: No Patient able to express need for assistance with ADLs?: Yes Does the patient have difficulty dressing or bathing?: Yes Independently performs ADLs?: No Communication: Independent Dressing (OT): Dependent Is this a change from baseline?: Pre-admission baseline Grooming: Dependent Is this a change from baseline?: Pre-admission baseline Feeding: Independent Bathing: Dependent Is this a change from baseline?: Pre-admission baseline Toileting: Dependent Is this a  change from baseline?: Pre-admission baseline In/Out Bed: Dependent Is this a change from baseline?: Pre-admission baseline Walks in Home: Dependent Is this a change from baseline?: Pre-admission baseline Does the patient have difficulty walking or climbing stairs?: Yes Weakness  of Legs: Both Weakness of Arms/Hands: None  Permission Sought/Granted Permission sought to share information with : Facility Contact Representative,Family Supports Permission granted to share information with : Yes, Verbal Permission Granted  Share Information with NAME: Lattie Corns  Permission granted to share info w AGENCY: SNFs  Permission granted to share info w Relationship: Daughter  Permission granted to share info w Contact Information: [phone]  Emotional Assessment Appearance:: Appears stated age Attitude/Demeanor/Rapport: Unable to Assess Affect (typically observed): Unable to Assess Orientation: : Oriented to Self,Oriented to Place,Oriented to Situation Alcohol / Substance Use: Not Applicable Psych Involvement: No (comment)  Admission diagnosis:  Altered mental status [R41.82] Patient Active Problem List   Diagnosis Date Noted  . Altered mental status 03/26/2021  . Rheumatoid arthritis involving multiple sites with positive rheumatoid factor (HCC) 08/27/2020  . Near syncope 12/13/2019  . Pre-operative clearance 05/29/2019  . Volume overload 05/29/2019  . Acute metabolic encephalopathy 03/30/2019  . Pressure injury of skin 01/12/2019  . AMS (altered mental status) 01/09/2019  . Rheumatoid arthritis flare (HCC) 12/23/2018  . Weakness of right lower extremity 12/23/2018  . Abscess of right upper extremity 12/14/2018  . Fever 12/13/2018  . Hand swelling 12/02/2018  . Pain, wrist 12/02/2018  . Hand pain 12/02/2018  . Polyarthritis 12/02/2018  . Spinal stenosis of lumbar region without neurogenic claudication 11/19/2018  . Acute encephalopathy 11/19/2018  . History of CVA (cerebrovascular accident) 11/19/2018  . Acute gout 11/15/2018  . Low back pain 11/14/2018  . Chronic back pain 10/27/2018  . Arthralgia of both knees 10/27/2018  . Rheumatoid arthritis involving both knees (HCC) 10/27/2018  . Acute pain 10/21/2018  . Physical deconditioning 09/06/2018  .  Hyperlipidemia associated with type 2 diabetes mellitus (HCC), refuses statin 09/06/2018  . Refusal of statin medication by patient 09/04/2018  . Deep vein thrombosis (DVT) of distal vein of right lower extremity (HCC) 07/30/2018  . Hypertensive heart disease with heart failure (HCC) 07/30/2018  . Pulmonary embolism (HCC), 07/2018, on Xarelto with goal to DC after 6 months if more active 07/10/2018  . Elevated brain natriuretic peptide (BNP) level   . DCM (dilated cardiomyopathy) (HCC)   . Benign hypertensive heart and kidney disease with CHF and stage 3 chronic kidney disease (HCC) 05/28/2018  . Osteoarthritis 05/06/2018  . Hypokalemia 02/02/2018  . Adhesive capsulitis of right shoulder 10/12/2017  . Idiopathic chronic gout of multiple sites with tophus 08/25/2017  . History of total knee arthroplasty, bilateral 02/18/2017  . History of rotator cuff surgery 02/18/2017  . High risk medications, long-term use 09/21/2016  . Chronic combined systolic and diastolic CHF (congestive heart failure) (HCC) 09/12/2015  . Bilateral edema of lower extremity 06/05/2014  . CKD stage 3 due to type 2 diabetes mellitus (HCC) 06/05/2014  . Type 2 diabetes mellitus with diabetic nephropathy, without long-term current use of insulin (HCC) 06/05/2014    Class: Chronic  . Anemia of chronic disease 05/01/2014  . CAD (coronary artery disease) 10/18/2013  . Hypertension associated with diabetes (HCC) 08/08/2013    Class: Chronic  . Fibromyalgia 05/10/2013  . Type 2 diabetes mellitus with hyperlipidemia (HCC), patient declines statin 04/27/2007  . Allergic rhinitis 04/27/2007  . Diverticulosis 04/27/2007   PCP:  Pcp, No Pharmacy:   [facility]  , [place] - [address] [place] [address] Phone: [phone] Fax: [phone]     Social Determinants of Health (SDOH) Interventions    Readmission Risk Interventions No flowsheet  data found.

## 2021-03-27 NOTE — Care Management Obs Status (Signed)
MEDICARE OBSERVATION STATUS NOTIFICATION   Patient Details  Name: [name] [name] MRN: [id number] Date of Birth: 1930-04-09   Medicare Observation Status Notification Given:  Yes    Mearl Latin, LCSW 03/27/2021, 7:40 PM

## 2021-03-27 NOTE — Progress Notes (Incomplete)
  Echocardiogram 2D Echocardiogram has been performed.  Shirlean Kelly 03/27/2021, 11:11 AM

## 2021-03-27 NOTE — [place] FL2 (Signed)
MEDICAID FL2 LEVEL OF CARE SCREENING TOOL     IDENTIFICATION  Patient Name: [name] [name] Birthdate: 10-16-1930 Sex: female Admission Date (Current Location): 03/26/2021  [facility] and [place] Number:  Producer, television/film/video and Address:  [facility], [address] [place], [place] [address]      Provider Number: [id number]  Attending Physician Name and Address:  Miguel Aschoff, MD  Relative Name and Phone Number:  Lattie Corns, daughter, [phone]    Current Level of Care: Hospital Recommended Level of Care: Skilled Nursing Facility Prior Approval Number:    Date Approved/Denied:   PASRR Number: [id number]  Discharge Plan: SNF    Current Diagnoses: Patient Active Problem List   Diagnosis Date Noted  . Altered mental status 03/26/2021  . Rheumatoid arthritis involving multiple sites with positive rheumatoid factor (HCC) 08/27/2020  . Near syncope 12/13/2019  . Pre-operative clearance 05/29/2019  . Volume overload 05/29/2019  . Acute metabolic encephalopathy 03/30/2019  . Pressure injury of skin 01/12/2019  . AMS (altered mental status) 01/09/2019  . Rheumatoid arthritis flare (HCC) 12/23/2018  . Weakness of right lower extremity 12/23/2018  . Abscess of right upper extremity 12/14/2018  . Fever 12/13/2018  . Hand swelling 12/02/2018  . Pain, wrist 12/02/2018  . Hand pain 12/02/2018  . Polyarthritis 12/02/2018  . Spinal stenosis of lumbar region without neurogenic claudication 11/19/2018  . Acute encephalopathy 11/19/2018  . History of CVA (cerebrovascular accident) 11/19/2018  . Acute gout 11/15/2018  . Low back pain 11/14/2018  . Chronic back pain 10/27/2018  . Arthralgia of both knees 10/27/2018  . Rheumatoid arthritis involving both knees (HCC) 10/27/2018  . Acute pain 10/21/2018  . Physical deconditioning 09/06/2018  . Hyperlipidemia associated with type 2 diabetes mellitus (HCC), refuses statin 09/06/2018  .  Refusal of statin medication by patient 09/04/2018  . Deep vein thrombosis (DVT) of distal vein of right lower extremity (HCC) 07/30/2018  . Hypertensive heart disease with heart failure (HCC) 07/30/2018  . Pulmonary embolism (HCC), 07/2018, on Xarelto with goal to DC after 6 months if more active 07/10/2018  . Elevated brain natriuretic peptide (BNP) level   . DCM (dilated cardiomyopathy) (HCC)   . Benign hypertensive heart and kidney disease with CHF and stage 3 chronic kidney disease (HCC) 05/28/2018  . Osteoarthritis 05/06/2018  . Hypokalemia 02/02/2018  . Adhesive capsulitis of right shoulder 10/12/2017  . Idiopathic chronic gout of multiple sites with tophus 08/25/2017  . History of total knee arthroplasty, bilateral 02/18/2017  . History of rotator cuff surgery 02/18/2017  . High risk medications, long-term use 09/21/2016  . Chronic combined systolic and diastolic CHF (congestive heart failure) (HCC) 09/12/2015  . Bilateral edema of lower extremity 06/05/2014  . CKD stage 3 due to type 2 diabetes mellitus (HCC) 06/05/2014  . Type 2 diabetes mellitus with diabetic nephropathy, without long-term current use of insulin (HCC) 06/05/2014  . Anemia of chronic disease 05/01/2014  . CAD (coronary artery disease) 10/18/2013  . Hypertension associated with diabetes (HCC) 08/08/2013  . Fibromyalgia 05/10/2013  . Type 2 diabetes mellitus with hyperlipidemia (HCC), patient declines statin 04/27/2007  . Allergic rhinitis 04/27/2007  . Diverticulosis 04/27/2007    Orientation RESPIRATION BLADDER Height & Weight     Self,Situation,Place  Normal Incontinent,External catheter Weight: 217 lb 13 oz (98.8 kg) Height:  5\' 4"  (162.6 cm)  BEHAVIORAL SYMPTOMS/MOOD NEUROLOGICAL BOWEL NUTRITION STATUS      Incontinent Diet (Please see DC Summary)  AMBULATORY  STATUS COMMUNICATION OF NEEDS Skin   Extensive Assist Verbally Normal                       Personal Care Assistance Level of Assistance   Bathing,Feeding,Dressing Bathing Assistance: Maximum assistance Feeding assistance: Independent Dressing Assistance: Limited assistance     Functional Limitations Info  Sight,Hearing Sight Info: Impaired Hearing Info: Impaired      SPECIAL CARE FACTORS FREQUENCY  PT (By licensed PT),OT (By licensed OT)     PT Frequency: 3x/week OT Frequency: 3x/week            Contractures Contractures Info: Not present    Additional Factors Info  Code Status,Allergies Code Status Info: DNR Allergies Info: Clonidine Derivatives, Fish Allergy, Shellfish Allergy, Doxycycline, Cetirizine Hcl, Citrus, Indomethacin, Methyldopa, Morphine And Related, Morphine Sulfate, Pregabalin, Shellfish-derived Products, Strawberry (Diagnostic), Strawberry Extract, Cetirizine Hcl, Codeine, Levaquin (Levofloxacin In D5w), Levofloxacin, Tomato           Current Medications (03/27/2021):  This is the current hospital active medication list Current Facility-Administered Medications  Medication Dose Route Frequency Provider Last Rate Last Admin  . acetaminophen (TYLENOL) tablet 650 mg  650 mg Oral Q6H PRN Alphonzo Severance, MD   650 mg at 03/27/21 0437  . albuterol (VENTOLIN HFA) 108 (90 Base) MCG/ACT inhaler 2 puff  2 puff Inhalation Q6H PRN Quincy Simmonds, MD      . allopurinol (ZYLOPRIM) tablet 200 mg  200 mg Oral Daily Quincy Simmonds, MD   200 mg at 03/27/21 1127  . enoxaparin (LOVENOX) injection 40 mg  40 mg Subcutaneous Q24H Quincy Simmonds, MD      . hydroxychloroquine (PLAQUENIL) tablet 200 mg  200 mg Oral BID Quincy Simmonds, MD   200 mg at 03/27/21 1128  . leflunomide (ARAVA) tablet 20 mg  20 mg Oral Daily Quincy Simmonds, MD   20 mg at 03/27/21 1127  . olopatadine (PATANOL) 0.1 % ophthalmic solution 1 drop  1 drop Both Eyes BID Quincy Simmonds, MD      . sacubitril-valsartan [facility]) 49-51 mg per tablet  1 tablet Oral Daily Quincy Simmonds, MD   1 tablet at 03/27/21 1128     Discharge Medications: Please  see discharge summary for a list of discharge medications.  Relevant Imaging Results:  Relevant Lab Results:   Additional Information SSN#: [id number]. Vaccinated and boosted  [employer], [place]

## 2021-03-28 DIAGNOSIS — R4182 Altered mental status, unspecified: Secondary | ICD-10-CM | POA: Diagnosis not present

## 2021-03-28 LAB — CBC
HCT: 27.1 % — ABNORMAL LOW (ref 36.0–46.0)
Hemoglobin: 8.6 g/dL — ABNORMAL LOW (ref 12.0–15.0)
MCH: 32 pg (ref 26.0–34.0)
MCHC: 31.7 g/dL (ref 30.0–36.0)
MCV: 100.7 fL — ABNORMAL HIGH (ref 80.0–100.0)
Platelets: 246 10*3/uL (ref 150–400)
RBC: 2.69 MIL/uL — ABNORMAL LOW (ref 3.87–5.11)
RDW: 16.3 % — ABNORMAL HIGH (ref 11.5–15.5)
WBC: 9.7 10*3/uL (ref 4.0–10.5)
nRBC: 0 % (ref 0.0–0.2)

## 2021-03-28 LAB — BASIC METABOLIC PANEL
Anion gap: 5 (ref 5–15)
BUN: 18 mg/dL (ref 8–23)
CO2: 25 mmol/L (ref 22–32)
Calcium: 8.6 mg/dL — ABNORMAL LOW (ref 8.9–10.3)
Chloride: 104 mmol/L (ref 98–111)
Creatinine, Ser: 0.88 mg/dL (ref 0.44–1.00)
GFR, Estimated: >60 mL/min (ref >60)
Glucose, Bld: 115 mg/dL — ABNORMAL HIGH (ref 70–99)
Potassium: 3.4 mmol/L — ABNORMAL LOW (ref 3.5–5.1)
Sodium: 134 mmol/L — ABNORMAL LOW (ref 135–145)

## 2021-03-28 MED ORDER — POTASSIUM CHLORIDE 20 MEQ PO PACK
40.0000 meq | PACK | Freq: Once | ORAL | Status: AC
  Administered 2021-03-28: 40 meq via ORAL
  Filled 2021-03-28: qty 2

## 2021-03-28 MED ORDER — AMLODIPINE BESYLATE 5 MG PO TABS: 5.0000 mg | ORAL_TABLET | Freq: Every day | ORAL | 11 refills | Status: AC

## 2021-03-28 MED ORDER — ALLOPURINOL 100 MG PO TABS: 200.0000 mg | ORAL_TABLET | Freq: Every day | ORAL | 0 refills | Status: DC

## 2021-03-28 MED ORDER — AMLODIPINE BESYLATE 2.5 MG PO TABS: 2.5000 mg | ORAL_TABLET | ORAL | 0 refills | Status: DC | PRN

## 2021-03-28 NOTE — Discharge Summary (Addendum)
Name: [name] [name] MRN: [id number] DOB: Mar 15, 1930 87 y.o. PCP: Pcp, No  Date of Admission: 03/26/2021 11:38 AM Date of Discharge:  03/28/21 Attending Physician: Miguel Aschoff, MD  Discharge Diagnosis: 1. Syncope  Discharge Medications: Allergies as of 03/28/2021      Reactions   Clonidine Derivatives Swelling   [name]   Fish Allergy Diarrhea, Swelling, Other (See Comments)   Turns skin "black," but can tolerate white fish Salmon- Diarrhea   Shellfish Allergy Hives   Doxycycline Rash, Other (See Comments)   Dizziness, also   Cetirizine Hcl Itching   Citrus Diarrhea, Other (See Comments)   Indigestion/heartburn, also; "Allergic to oranges" noted on MAR   Indomethacin Other (See Comments)   "Allergic," per MAR   Methyldopa Diarrhea   Morphine And Related Nausea And Vomiting, Other (See Comments)   "Family reports it drops her b/p that she needs iv fluids"   Morphine Sulfate Nausea And Vomiting, Other (See Comments)   "Family reports it drops her b/p that she needs iv fluids"   Pregabalin Nausea Only, Other (See Comments)   Hallucinations, also   Shellfish-derived Products Itching, Other (See Comments)   Dizziness, also   Strawberry (diagnostic) Itching   Strawberry Extract Itching, Other (See Comments)   Dizziness, also   Cetirizine Hcl Itching, Rash   Codeine Hives, Itching   Levaquin [levofloxacin In D5w] Rash   Levofloxacin Rash   Tomato Rash      Medication List    STOP taking these medications   acetaminophen 500 MG tablet Commonly known as: TYLENOL   aspirin 81 MG chewable tablet   benzonatate 100 MG capsule Commonly known as: TESSALON   carvedilol 25 MG tablet Commonly known as: COREG   Entresto 49-51 MG Generic drug: sacubitril-valsartan   ferrous sulfate 325 (65 FE) MG tablet   furosemide 40 MG tablet Commonly known as: LASIX   hydrALAZINE 25 MG  tablet Commonly known as: APRESOLINE   isosorbide mononitrate 30 MG 24 hr tablet Commonly known as: IMDUR   potassium chloride SA 20 MEQ tablet Commonly known as: KLOR-CON   predniSONE 5 MG tablet Commonly known as: DELTASONE   PREVIDENT 5000 PLUS DT   vitamin C 500 MG tablet Commonly known as: ASCORBIC ACID   Xarelto 20 MG Tabs tablet Generic drug: rivaroxaban     TAKE these medications   Acidophilus/Pectin Caps Take 1 capsule by mouth daily.   albuterol 108 (90 Base) MCG/ACT inhaler Commonly known as: VENTOLIN HFA Inhale 2 puffs into the lungs every 6 (six) hours as needed for wheezing or shortness of breath.   allopurinol 100 MG tablet Commonly known as: ZYLOPRIM Take 2 tablets (200 mg total) by mouth daily. What changed: when to take this   amLODipine 5 MG tablet Commonly known as: NORVASC Take 1 tablet (5 mg total) by mouth daily. What changed: Another medication with the same name was added. Make sure you understand how and when to take each.   amLODipine 2.5 MG tablet Commonly known as: NORVASC Take 1 tablet (2.5 mg total) by mouth as needed. For SBP >180 What changed: You were already taking a medication with the same name, and this prescription was added. Make sure you understand how and when to take each.   Biofreeze 4 % Gel Generic drug: Menthol (Topical Analgesic) Apply 1 application topically 4 (four) times daily. Bilateral shoulders and knees   D3-50 1.25 MG (50000 UT) capsule  Generic drug: Cholecalciferol Take 50,000 Units by mouth every 30 (thirty) days.   fluticasone 50 MCG/ACT nasal spray Commonly known as: FLONASE Place 2 sprays into both nostrils in the morning and at bedtime.   gabapentin 100 MG capsule Commonly known as: NEURONTIN Take 100-200 mg by mouth See admin instructions. Take 1 capsule by mouth twice daily, and 2 capsules at bedtime   hydroxychloroquine 200 MG tablet Commonly known as: PLAQUENIL Take 1 tablet (200 mg total)  by mouth 2 (two) times daily.   leflunomide 20 MG tablet Commonly known as: ARAVA Take 20 mg by mouth every evening.   loratadine 10 MG tablet Commonly known as: CLARITIN Take 10 mg by mouth at bedtime.   Olopatadine HCl 0.2 % Soln INSTILL 1 DROP IN BOTH EYES DAILY What changed: See the new instructions.   Omega 3-6-9 Complex Caps Take 1 capsule by mouth every evening. Omega 3 Black   senna-docusate 8.6-50 MG tablet Commonly known as: Senokot-S Take 1-2 tablets by mouth See admin instructions. Take 2 tablets by mouth in the morning and in the evening. Take 1 tablet at bedtime   Vitamin D (Ergocalciferol) 1.25 MG (50000 UNIT) Caps capsule Commonly known as: DRISDOL Take 50,000 Units by mouth every 30 (thirty) days.       Disposition and follow-up:   [name] [name] was discharged from [facility] in Stable condition.  At the hospital follow up visit please address:  1.  Syncope: please assess for further episodes, monitor BP and adjust as needed. Would recommend limiting BP medications to prevent hypotension ans her BP are very labile and may precipitate further syncope  2.  Labs / imaging needed at time of follow-up: none   3.  Pending labs/ test needing follow-up: none  Follow-up Appointments:  Contact information for after-discharge care    Destination    [facility] .   Service: Skilled Nursing Contact information: [address] [place] [address] [phone]                  Hospital Course by problem list: [name] Williamsis a 89 year oldfemalewithhistory ofcombined systolic and diastolic heart failure, CAD, CKDIII, type 2 diabetes melltus, hypertension, hyperlipidemia,prior TIA, PE/DVT on xarellto, and rheumatoid arthritisadmitted for syncope.  Syncope Patient presenting with 10 minute episode of unresponsiveness. She was sitting on her wheelchair when her daughter noticed she was slumped  to her right and not responding. Normally resides at [facility] but was at home preparing for daughter's birthday party. Daughter noted left facial droop and brought her to ED.  Lethargic in ED but facial droop resolved. Code stroke initiated for CVA vs TIA. CT and MRI negative for stroke. EEG preformed with generalized slowing but no epileptiform activity. Not felt that her presentation was due to stroke or seizure. Neurology recommended outpatient EEG monitoring if she were to develop more episodes with confusion or lethargy. Mild leukocytosis that resolved during admission. No EKG changes and troponins normal.   Patient improved during admission and had no further episodes. Echo with recovered EF of 60-65%. Notable for increased severe asymmetric left ventricular hypertrophy of the basal-septal segment increase from prior study on 06/01/2019. Orthostatic vitals negative, however pressures have been labile in during admission with SBP ranging between hight 90s and low 180s without symptoms. She has also had a history of hypotension need to be taken off antihypertensives in the past. Etiology of syncope likely multifactorial due to orthostatic hypotension, vasovagal,  and left ventrilar outflow obstruction. Entresto, hydralazine, carvedilol, and imdur to maintain LV filling. Started on amlodipine 2.5 mg as needed for SBP over 180. Discussed with daughter that patient is not getting enough PT at [facility] and patient has not made much progress in strength and requesting new SNF. Will discharge to [facility].   Chronic combined systolic and diastolic CHF  BNP normal, not on diuretics currently. Echo demonstrates recovered EF of 60-65%. Will stop  Entresto, hydralazine, carvedilol, and imdur.  PRN amlodipine started for elevated BP.   Discharge Exam:   BP (!) 140/59 (BP Location: Left Arm)   Pulse 100   Temp 98 F (36.7 C) (Axillary)   Resp 20   Ht 5\' 4"  (1.626 m)   Wt 98.8 kg   SpO2 97%   BMI 37.39  kg/m  Discharge exam: Physical Exam Constitutional:      Appearance: She is obese.  Cardiovascular:     Rate and Rhythm: Regular rhythm. Tachycardia present.     Heart sounds: No murmur heard. No friction rub. No gallop.   Pulmonary:     Effort: Pulmonary effort is normal. No respiratory distress.     Breath sounds: Normal breath sounds.  Abdominal:     General: Abdomen is flat. Bowel sounds are normal.     Palpations: Abdomen is soft.  Musculoskeletal:     Right lower leg: Edema present.     Left lower leg: Edema present.  Neurological:     General: No focal deficit present.     Mental Status: She is alert and oriented to person, place, and time.    Pertinent Labs, Studies, and Procedures:  MR BRAIN WO CONTRAST  Result Date: 03/26/2021 CLINICAL DATA:  Neuro deficit, acute, stroke suspected. EXAM: MRI HEAD WITHOUT CONTRAST TECHNIQUE: Multiplanar, multiecho pulse sequences of the brain and surrounding structures were obtained without intravenous contrast. COMPARISON:  Head CT Mar 26, 2021 FINDINGS: Brain: No acute infarction, hemorrhage, hydrocephalus, extra-axial collection or mass lesion. Scattered and confluent foci of T2 hyperintensity are seen within the white matter of cerebral hemispheres, nonspecific, most likely related to chronic small vessel ischemia. Mild parenchymal volume loss. No significant change from prior MRI performed in December 13, 2019. Vascular: Limited evaluation due to artifact.  Grossly preserved. Skull and upper cervical spine: Normal marrow signal. Sinuses/Orbits: Mucosal thickening scattered throughout the paranasal sinuses with fluid level within the bilateral sphenoid sinuses. Bilateral lens surgery. Other: Mild bilateral mastoid effusion. IMPRESSION: 1. No acute intracranial abnormality. 2. Moderate chronic microvascular ischemic changes of the white matter and mild parenchymal volume loss, stable. Electronically Signed   By: December 15, 2019 [name]    On: 03/26/2021 16:01   DG Chest Port 1 View  Result Date: 03/26/2021 CLINICAL DATA:  Altered mental status. EXAM: PORTABLE CHEST 1 VIEW COMPARISON:  Radiographs 12/13/2019 and 10/29/2019.  CT 09/05/2018. FINDINGS: 1359 hours. Stable cardiomegaly and chronic aortic tortuosity. There are low lung volumes with mild chronic bibasilar atelectasis. No superimposed edema, confluent airspace opacity, pneumothorax or significant pleural effusion. Glenohumeral degenerative changes are present bilaterally with postsurgical changes on the right. No acute osseous findings. Telemetry leads overlie the chest. IMPRESSION: Stable chest with chronic cardiomegaly and bibasilar atelectasis. No acute cardiopulmonary process. Electronically Signed   By: 09/07/2018 [name]   On: 03/26/2021 14:32   EEG adult  Result Date: 03/27/2021 03/29/2021, MD     03/27/2021  8:49 AM Patient Name: KALAN RINN MRN: [name] Epilepsy  Attending: Charlsie Quest Referring Physician/Provider: Jimmye Norman, NP Date: 03/26/2021 Duration: 22.40 mins Patient history: 87 year old female with history of seizures, syncope who presented after episode of unresponsiveness for about 10 to 15 minutes and left-sided facial drooping.  EEG evaluate for seizures. Level of alertness: Awake AEDs during EEG study: None Technical aspects: This EEG study was done with scalp electrodes positioned according to the 10-20 International system of electrode placement. Electrical activity was acquired at a sampling rate of 500Hz  and reviewed with a high frequency filter of 70Hz  and a low frequency filter of 1Hz . EEG data were recorded continuously and digitally stored. Description: The posterior dominant rhythm consists of 8-9 Hz activity of moderate voltage (25-35 uV) seen predominantly in posterior head regions, symmetric and reactive to eye opening and eye closing. Intermittent generalized 3 to 5 Hz theta-delta slowing was also noted. Hyperventilation  and photic stimulation were not performed.   ABNORMALITY - Intermittent slow, generalized IMPRESSION: This study is suggestive of mild diffuse encephalopathy, nonspecific etiology. No seizures or epileptiform discharges were seen throughout the recording. Charlsie Quest   ECHOCARDIOGRAM COMPLETE  Result Date: 03/27/2021    ECHOCARDIOGRAM REPORT   Patient Name:   PHEBE EHLINGER Date of Exam: 03/27/2021 Medical Rec #:  [id number]        Height:       64.0 in Accession #:    [id number]       Weight:       217.8 lb Date of Birth:  18-May-1930        BSA:          2.029 m Patient Age:    89 years         BP:           164/86 mmHg Patient Gender: F                HR:           90 bpm. Exam Location:  Inpatient Procedure: 2D Echo, Cardiac Doppler and Color Doppler Indications:    Syncope  History:        Patient has prior history of Echocardiogram examinations, most                 recent 05/29/2019. Risk Factors:Hypertension, Diabetes and                 Dyslipidemia.  Sonographer:    Shirlean Kelly Referring Phys: [phone] JULIE ANNE Ackers IMPRESSIONS  1. Left ventricular ejection fraction, by estimation, is 60 to 65%. The left ventricle has normal function. The left ventricle has no regional wall motion abnormalities. There is severe asymmetric left ventricular hypertrophy of the basal-septal segment. Left ventricular diastolic parameters are indeterminate.  2. Right ventricular systolic function is normal. The right ventricular size is normal. There is normal pulmonary artery systolic pressure. The estimated right ventricular systolic pressure is 32.2 mmHg.  3. The mitral valve is degenerative. Trivial mitral valve regurgitation. No evidence of mitral stenosis. The mean mitral valve gradient is 4.0 mmHg with average heart rate of 87 bpm.  4. Tricuspid valve regurgitation is mild to moderate.  5. The aortic valve is grossly normal. There is mild calcification of the aortic valve. Aortic valve regurgitation is  trivial. No aortic stenosis is present.  6. The inferior vena cava is normal in size with greater than 50% respiratory variability, suggesting right atrial pressure of 3 mmHg. FINDINGS  Left Ventricle: Left ventricular ejection fraction, by estimation,  is 60 to 65%. The left ventricle has normal function. The left ventricle has no regional wall motion abnormalities. The left ventricular internal cavity size was normal in size. There is  severe asymmetric left ventricular hypertrophy of the basal-septal segment. Left ventricular diastolic parameters are indeterminate. Right Ventricle: The right ventricular size is normal. No increase in right ventricular wall thickness. Right ventricular systolic function is normal. There is normal pulmonary artery systolic pressure. The tricuspid regurgitant velocity is 2.70 m/s, and  with an assumed right atrial pressure of 3 mmHg, the estimated right ventricular systolic pressure is 32.2 mmHg. Left Atrium: Left atrial size was normal in size. Right Atrium: Right atrial size was normal in size. Pericardium: There is no evidence of pericardial effusion. Mitral Valve: MV DVI 0.66, MVA by continuity 2.3 cm2. The mitral valve is degenerative in appearance. Mild to moderate mitral annular calcification. Trivial mitral valve regurgitation. No evidence of mitral valve stenosis. MV peak gradient, 8.9 mmHg. The  mean mitral valve gradient is 4.0 mmHg with average heart rate of 87 bpm. Tricuspid Valve: The tricuspid valve is normal in structure. Tricuspid valve regurgitation is mild to moderate. No evidence of tricuspid stenosis. Aortic Valve: The aortic valve is grossly normal. There is mild calcification of the aortic valve. Aortic valve regurgitation is trivial. No aortic stenosis is present. Aortic valve mean gradient measures 3.0 mmHg. Aortic valve peak gradient measures 6.9  mmHg. Aortic valve area, by VTI measures 2.19 cm. Pulmonic Valve: The pulmonic valve was grossly normal.  Pulmonic valve regurgitation is mild. No evidence of pulmonic stenosis. Aorta: The aortic root is normal in size and structure. Venous: The inferior vena cava is normal in size with greater than 50% respiratory variability, suggesting right atrial pressure of 3 mmHg. IAS/Shunts: No atrial level shunt detected by color flow Doppler.  LEFT VENTRICLE PLAX 2D LVIDd:         3.50 cm  Diastology LVIDs:         2.70 cm  LV e' medial:    4.79 cm/s LV PW:         1.50 cm  LV E/e' medial:  18.0 LV IVS:        1.80 cm  LV e' lateral:   4.03 cm/s LVOT diam:     2.10 cm  LV E/e' lateral: 21.3 LV SV:         55 LV SV Index:   27 LVOT Area:     3.46 cm  RIGHT VENTRICLE             IVC RV Basal diam:  2.60 cm     IVC diam: 0.70 cm RV S prime:     17.80 cm/s TAPSE (M-mode): 2.4 cm LEFT ATRIUM           Index       RIGHT ATRIUM           Index LA diam:      2.90 cm 1.43 cm/m  RA Area:     16.30 cm LA Vol (A4C): 43.6 ml 21.49 ml/m RA Volume:   37.80 ml  18.63 ml/m  AORTIC VALVE AV Area (Vmax):    2.29 cm AV Area (Vmean):   2.33 cm AV Area (VTI):     2.19 cm AV Vmax:           131.00 cm/s AV Vmean:          87.600 cm/s AV VTI:  0.252 m AV Peak Grad:      6.9 mmHg AV Mean Grad:      3.0 mmHg LVOT Vmax:         86.70 cm/s LVOT Vmean:        58.900 cm/s LVOT VTI:          0.159 m LVOT/AV VTI ratio: 0.63  AORTA Ao Root diam: 3.20 cm Ao Asc diam:  3.10 cm MITRAL VALVE                TRICUSPID VALVE MV Area (PHT): 5.02 cm     TR Peak grad:   29.2 mmHg MV Area VTI:   2.27 cm     TR Vmax:        270.00 cm/s MV Peak grad:  8.9 mmHg MV Mean grad:  4.0 mmHg     SHUNTS MV Vmax:       1.49 m/s     Systemic VTI:  0.16 m MV Vmean:      94.9 cm/s    Systemic Diam: 2.10 cm MV Decel Time: 151 msec MV E velocity: 86.00 cm/s MV A velocity: 139.00 cm/s MV E/A ratio:  0.62 Weston Brass MD Electronically signed by Weston Brass MD Signature Date/Time: 03/27/2021/3:36:02 PM    Final    CT HEAD CODE STROKE WO CONTRAST  Result Date:  03/26/2021 CLINICAL DATA:  Neuro deficit, acute, stroke suspected. Additional history provided: Facial droop. EXAM: CT HEAD WITHOUT CONTRAST TECHNIQUE: Contiguous axial images were obtained from the base of the skull through the vertex without intravenous contrast. COMPARISON:  Brain MRI 12/13/2019. Prior head CT examinations 03/29/2019 and earlier. FINDINGS: Brain: Mild for age cerebral and cerebellar atrophy. Patchy and confluent hypoattenuation within the cerebral white matter, nonspecific but compatible with chronic small vessel ischemic disease. There is no acute intracranial hemorrhage. No demarcated cortical infarct. No extra-axial fluid collection. No evidence of intracranial mass. No midline shift. Vascular: No hyperdense vessel.  Atherosclerotic calcifications. Skull: Normal. Negative for fracture or focal lesion. Sinuses/Orbits: Visualized orbits show no acute finding. Mild mucosal thickening within the left greater than right ethmoid air cells. Moderate mucosal thickening versus fluid level within the left sphenoid sinus. ASPECTS [facility] Stroke Program Early CT Score) - Ganglionic level infarction (caudate, lentiform nuclei, internal capsule, insula, M1-M3 cortex): 7 - Supraganglionic infarction (M4-M6 cortex): 3 Total score (0-10 with 10 being normal): 10 These results were communicated to Dr. Wilford Corner at 12:33 pmon 5/18/2022by text page via the [facility] messaging system. IMPRESSION: No evidence of acute intracranial abnormality. Stable parenchymal atrophy and cerebral white matter chronic small vessel ischemic disease. Paranasal sinus disease, as described. Electronically Signed   By: Jackey Loge DO   On: 03/26/2021 12:33   Discharge Instructions: Discharge Instructions    Call MD for:  persistant dizziness or light-headedness   Complete by: As directed    Diet - low sodium heart healthy   Complete by: As directed    Increase activity slowly   Complete by: As directed       Signed: Quincy Simmonds, MD 03/28/2021, 11:31 AM   Pager: [phone]

## 2021-03-28 NOTE — Discharge Instructions (Signed)
Dear Ms. Sindi Beckworth,  It was a pleasure taking care of you in the hospital. You were hospitalized for an episode of fainting. This was likely due to a combinations of factors including temporarly low blood pressure, thickened left side of the heart leading too poor filling of the hear, and a change from you normal routine.   To help prevent this from happening please stop all you blood pressure medications including entresto, hydralazine, carvedilol, and imdur. I will advise you facility that a small dose of amlodipine 2.5 mg may be helpful if your blood pressure gets dangerously high. Otherwise we will try to martian good filling of you heart by preventing you blood pressure from being low.

## 2021-03-28 NOTE — Progress Notes (Signed)
Attempted to call report to [address] facility x2.

## 2021-03-28 NOTE — TOC Transition Note (Signed)
Transition of Care [facility]) - CM/SW Discharge Note   Patient Details  Name: [name] [name] MRN: [id number] Date of Birth: [birth year]  Transition of [facility]) CM/SW Contact:  Mearl Latin, LCSW Phone Number: 03/28/2021, 12:34 PM   Clinical Narrative:    Patient will DC to: [address] [address] Anticipated DC date: 03/28/21 Family notified: Daughter, Lattie Corns Transport by: Sharin Mons  Per MD patient ready for DC to [facility]. RN to call report prior to discharge (Report [phone], room 107p (if that # doesn't work then call main number [phone])). RN, patient, patient's family, and facility notified of DC. Discharge Summary and FL2 sent to facility. DC packet on chart. Ambulance transport requested for patient.   CSW will sign off for now as social work intervention is no longer needed. Please consult [place] again if new needs arise.      Final next level of care: Skilled Nursing Facility Barriers to Discharge: Barriers Resolved   Patient Goals and CMS Choice Patient states their goals for this hospitalization and ongoing recovery are:: Rehab CMS Medicare.gov Compare Post Acute Care list provided to:: Patient Represenative (must comment) Choice offered to / list presented to : Adult Children,Patient  Discharge Placement   Existing PASRR number confirmed : 03/28/21          Patient chooses bed at: [address] [address] Patient to be transferred to facility by: [facility] Name of family member notified: Daughter, Para March Patient and family notified of of transfer: 03/28/21  Discharge Plan and Services In-house Referral: Clinical Social Work   Post Acute Care Choice: Skilled Nursing Facility                               Social Determinants of Health (SDOH) Interventions     Readmission Risk Interventions No flowsheet data found.

## 2021-03-28 NOTE — Plan of Care (Signed)
  Problem: Education: Goal: Knowledge of secondary prevention will improve Outcome: Progressing Goal: Knowledge of patient specific risk factors addressed and post discharge goals established will improve Outcome: Progressing   

## 2021-03-28 NOTE — Progress Notes (Signed)
Patient was discharged from 5W25. IV d/c'd, skin intact, vitals stable. All belongings sent with patient. Patient will be transferred to SNF via [facility]. Daughter has been informed of departure.

## 2021-03-29 LAB — URINE CULTURE: Culture: >=100000 — AB

## 2021-03-31 LAB — CULTURE, BLOOD (ROUTINE X 2)
Culture: NO GROWTH
Culture: NO GROWTH
Special Requests: ADEQUATE

## 2021-07-04 ENCOUNTER — Emergency Department (HOSPITAL_COMMUNITY): Payer: Medicare Other

## 2021-07-04 ENCOUNTER — Other Ambulatory Visit: Payer: Self-pay

## 2021-07-04 ENCOUNTER — Inpatient Hospital Stay (HOSPITAL_COMMUNITY)
Admission: EM | Admit: 2021-07-04 | Discharge: 2021-07-16 | DRG: 291 | Disposition: A | Discharge status: Skilled Nursing Facility | Payer: Medicare Other | Attending: Internal Medicine | Admitting: Internal Medicine

## 2021-07-04 DIAGNOSIS — D631 Anemia in chronic kidney disease: Secondary | ICD-10-CM | POA: Diagnosis present

## 2021-07-04 DIAGNOSIS — E876 Hypokalemia: Secondary | ICD-10-CM | POA: Diagnosis present

## 2021-07-04 DIAGNOSIS — Z20822 Contact with and (suspected) exposure to covid-19: Secondary | ICD-10-CM | POA: Diagnosis present

## 2021-07-04 DIAGNOSIS — Z86711 Personal history of pulmonary embolism: Secondary | ICD-10-CM

## 2021-07-04 DIAGNOSIS — Z91013 Allergy to seafood: Secondary | ICD-10-CM

## 2021-07-04 DIAGNOSIS — R627 Adult failure to thrive: Secondary | ICD-10-CM | POA: Diagnosis present

## 2021-07-04 DIAGNOSIS — N179 Acute kidney failure, unspecified: Secondary | ICD-10-CM | POA: Diagnosis present

## 2021-07-04 DIAGNOSIS — R531 Weakness: Secondary | ICD-10-CM

## 2021-07-04 DIAGNOSIS — Z8701 Personal history of pneumonia (recurrent): Secondary | ICD-10-CM

## 2021-07-04 DIAGNOSIS — K219 Gastro-esophageal reflux disease without esophagitis: Secondary | ICD-10-CM | POA: Diagnosis present

## 2021-07-04 DIAGNOSIS — I2510 Atherosclerotic heart disease of native coronary artery without angina pectoris: Secondary | ICD-10-CM | POA: Diagnosis present

## 2021-07-04 DIAGNOSIS — I440 Atrioventricular block, first degree: Secondary | ICD-10-CM | POA: Diagnosis present

## 2021-07-04 DIAGNOSIS — Z66 Do not resuscitate: Secondary | ICD-10-CM | POA: Diagnosis present

## 2021-07-04 DIAGNOSIS — Z9109 Other allergy status, other than to drugs and biological substances: Secondary | ICD-10-CM

## 2021-07-04 DIAGNOSIS — E871 Hypo-osmolality and hyponatremia: Secondary | ICD-10-CM | POA: Diagnosis present

## 2021-07-04 DIAGNOSIS — I130 Hypertensive heart and chronic kidney disease with heart failure and stage 1 through stage 4 chronic kidney disease, or unspecified chronic kidney disease: Principal | ICD-10-CM | POA: Diagnosis present

## 2021-07-04 DIAGNOSIS — Z888 Allergy status to other drugs, medicaments and biological substances status: Secondary | ICD-10-CM

## 2021-07-04 DIAGNOSIS — R062 Wheezing: Secondary | ICD-10-CM

## 2021-07-04 DIAGNOSIS — R54 Age-related physical debility: Secondary | ICD-10-CM | POA: Diagnosis present

## 2021-07-04 DIAGNOSIS — Z885 Allergy status to narcotic agent status: Secondary | ICD-10-CM

## 2021-07-04 DIAGNOSIS — Z8673 Personal history of transient ischemic attack (TIA), and cerebral infarction without residual deficits: Secondary | ICD-10-CM

## 2021-07-04 DIAGNOSIS — E2740 Unspecified adrenocortical insufficiency: Secondary | ICD-10-CM | POA: Diagnosis present

## 2021-07-04 DIAGNOSIS — E872 Acidosis: Secondary | ICD-10-CM | POA: Diagnosis present

## 2021-07-04 DIAGNOSIS — T508X5A Adverse effect of diagnostic agents, initial encounter: Secondary | ICD-10-CM | POA: Diagnosis present

## 2021-07-04 DIAGNOSIS — Z6838 Body mass index (BMI) 38.0-38.9, adult: Secondary | ICD-10-CM

## 2021-07-04 DIAGNOSIS — R6510 Systemic inflammatory response syndrome (SIRS) of non-infectious origin without acute organ dysfunction: Secondary | ICD-10-CM

## 2021-07-04 DIAGNOSIS — Z79899 Other long term (current) drug therapy: Secondary | ICD-10-CM

## 2021-07-04 DIAGNOSIS — I509 Heart failure, unspecified: Secondary | ICD-10-CM

## 2021-07-04 DIAGNOSIS — M62838 Other muscle spasm: Secondary | ICD-10-CM | POA: Diagnosis present

## 2021-07-04 DIAGNOSIS — I5023 Acute on chronic systolic (congestive) heart failure: Secondary | ICD-10-CM | POA: Diagnosis not present

## 2021-07-04 DIAGNOSIS — M069 Rheumatoid arthritis, unspecified: Secondary | ICD-10-CM | POA: Diagnosis present

## 2021-07-04 DIAGNOSIS — I5043 Acute on chronic combined systolic (congestive) and diastolic (congestive) heart failure: Secondary | ICD-10-CM | POA: Diagnosis present

## 2021-07-04 DIAGNOSIS — Z881 Allergy status to other antibiotic agents status: Secondary | ICD-10-CM

## 2021-07-04 DIAGNOSIS — Z961 Presence of intraocular lens: Secondary | ICD-10-CM | POA: Diagnosis present

## 2021-07-04 DIAGNOSIS — J9601 Acute respiratory failure with hypoxia: Secondary | ICD-10-CM | POA: Diagnosis present

## 2021-07-04 DIAGNOSIS — N1831 Chronic kidney disease, stage 3a: Secondary | ICD-10-CM | POA: Diagnosis present

## 2021-07-04 DIAGNOSIS — E785 Hyperlipidemia, unspecified: Secondary | ICD-10-CM | POA: Diagnosis present

## 2021-07-04 DIAGNOSIS — R600 Localized edema: Secondary | ICD-10-CM

## 2021-07-04 DIAGNOSIS — G9341 Metabolic encephalopathy: Secondary | ICD-10-CM | POA: Diagnosis present

## 2021-07-04 DIAGNOSIS — Z96653 Presence of artificial knee joint, bilateral: Secondary | ICD-10-CM | POA: Diagnosis present

## 2021-07-04 DIAGNOSIS — E1122 Type 2 diabetes mellitus with diabetic chronic kidney disease: Secondary | ICD-10-CM | POA: Diagnosis present

## 2021-07-04 DIAGNOSIS — Z7982 Long term (current) use of aspirin: Secondary | ICD-10-CM

## 2021-07-04 DIAGNOSIS — N183 Chronic kidney disease, stage 3 unspecified: Secondary | ICD-10-CM | POA: Diagnosis present

## 2021-07-04 DIAGNOSIS — I82592 Chronic embolism and thrombosis of other specified deep vein of left lower extremity: Secondary | ICD-10-CM | POA: Diagnosis present

## 2021-07-04 DIAGNOSIS — Z7952 Long term (current) use of systemic steroids: Secondary | ICD-10-CM

## 2021-07-04 DIAGNOSIS — Z8249 Family history of ischemic heart disease and other diseases of the circulatory system: Secondary | ICD-10-CM

## 2021-07-04 DIAGNOSIS — N141 Nephropathy induced by other drugs, medicaments and biological substances: Secondary | ICD-10-CM | POA: Diagnosis present

## 2021-07-04 LAB — CBC WITH DIFFERENTIAL/PLATELET
Abs Immature Granulocytes: 0.04 10*3/uL (ref 0.00–0.07)
Basophils Absolute: 0.1 10*3/uL (ref 0.0–0.1)
Basophils Relative: 1 %
Eosinophils Absolute: 0.3 10*3/uL (ref 0.0–0.5)
Eosinophils Relative: 4 %
HCT: 33.3 % — ABNORMAL LOW (ref 36.0–46.0)
Hemoglobin: 10.6 g/dL — ABNORMAL LOW (ref 12.0–15.0)
Immature Granulocytes: 0 %
Lymphocytes Relative: 18 %
Lymphs Abs: 1.6 10*3/uL (ref 0.7–4.0)
MCH: 31.4 pg (ref 26.0–34.0)
MCHC: 31.8 g/dL (ref 30.0–36.0)
MCV: 98.5 fL (ref 80.0–100.0)
Monocytes Absolute: 0.3 10*3/uL (ref 0.1–1.0)
Monocytes Relative: 3 %
Neutro Abs: 6.7 10*3/uL (ref 1.7–7.7)
Neutrophils Relative %: 74 %
Platelets: 209 10*3/uL (ref 150–400)
RBC: 3.38 MIL/uL — ABNORMAL LOW (ref 3.87–5.11)
RDW: 18.5 % — ABNORMAL HIGH (ref 11.5–15.5)
WBC: 9 10*3/uL (ref 4.0–10.5)
nRBC: 0 % (ref 0.0–0.2)

## 2021-07-04 LAB — COMPREHENSIVE METABOLIC PANEL
ALT: 13 U/L (ref 0–44)
AST: 13 U/L — ABNORMAL LOW (ref 15–41)
Albumin: 3.3 g/dL — ABNORMAL LOW (ref 3.5–5.0)
Alkaline Phosphatase: 58 U/L (ref 38–126)
Anion gap: 8 (ref 5–15)
BUN: 33 mg/dL — ABNORMAL HIGH (ref 8–23)
CO2: 25 mmol/L (ref 22–32)
Calcium: 8.7 mg/dL — ABNORMAL LOW (ref 8.9–10.3)
Chloride: 104 mmol/L (ref 98–111)
Creatinine, Ser: 1.29 mg/dL — ABNORMAL HIGH (ref 0.44–1.00)
GFR, Estimated: 40 mL/min — ABNORMAL LOW (ref >60)
Glucose, Bld: 96 mg/dL (ref 70–99)
Potassium: 3.6 mmol/L (ref 3.5–5.1)
Sodium: 137 mmol/L (ref 135–145)
Total Bilirubin: 0.7 mg/dL (ref 0.3–1.2)
Total Protein: 6.2 g/dL — ABNORMAL LOW (ref 6.5–8.1)

## 2021-07-04 LAB — RESP PANEL BY RT-PCR (FLU A&B, COVID) ARPGX2
Influenza A by PCR: NEGATIVE
Influenza B by PCR: NEGATIVE
SARS Coronavirus 2 by RT PCR: NEGATIVE

## 2021-07-04 LAB — TROPONIN I (HIGH SENSITIVITY): Troponin I (High Sensitivity): 35 ng/L — ABNORMAL HIGH (ref <18)

## 2021-07-04 LAB — LACTIC ACID, PLASMA: Lactic Acid, Venous: 1.1 mmol/L (ref 0.5–1.9)

## 2021-07-04 MED ORDER — FUROSEMIDE 10 MG/ML IJ SOLN
60.0000 mg | Freq: Once | INTRAMUSCULAR | Status: AC
  Administered 2021-07-05: 60 mg via INTRAVENOUS
  Filled 2021-07-04: qty 6

## 2021-07-04 MED ORDER — IPRATROPIUM-ALBUTEROL 0.5-2.5 (3) MG/3ML IN SOLN
3.0000 mL | Freq: Once | RESPIRATORY_TRACT | Status: AC
  Administered 2021-07-04: 3 mL via RESPIRATORY_TRACT
  Filled 2021-07-04: qty 3

## 2021-07-04 MED ORDER — ALBUTEROL SULFATE HFA 108 (90 BASE) MCG/ACT IN AERS
2.0000 | INHALATION_SPRAY | Freq: Once | RESPIRATORY_TRACT | Status: AC
  Administered 2021-07-04: 2 via RESPIRATORY_TRACT
  Filled 2021-07-04: qty 6.7

## 2021-07-04 MED ORDER — METRONIDAZOLE 500 MG/100ML IV SOLN
500.0000 mg | Freq: Once | INTRAVENOUS | Status: AC
  Administered 2021-07-05: 500 mg via INTRAVENOUS
  Filled 2021-07-04: qty 100

## 2021-07-04 MED ORDER — SODIUM CHLORIDE 0.9 % IV SOLN
2.0000 g | Freq: Once | INTRAVENOUS | Status: AC
  Administered 2021-07-05: 2 g via INTRAVENOUS
  Filled 2021-07-04: qty 2

## 2021-07-04 MED ORDER — VANCOMYCIN HCL 2000 MG/400ML IV SOLN
2000.0000 mg | Freq: Once | INTRAVENOUS | Status: AC
Start: 2021-07-04 — End: 2021-07-05
  Administered 2021-07-05: 2000 mg via INTRAVENOUS
  Filled 2021-07-04: qty 400

## 2021-07-04 MED ORDER — VANCOMYCIN HCL 1250 MG/250ML IV SOLN: 1250.0000 mg | INTRAVENOUS | Status: DC

## 2021-07-04 MED ORDER — CEFEPIME HCL 2 G IJ SOLR: 2.0000 g | Freq: Two times a day (BID) | INTRAMUSCULAR | Status: DC

## 2021-07-04 MED ORDER — ACETAMINOPHEN 325 MG PO TABS
650.0000 mg | ORAL_TABLET | Freq: Once | ORAL | Status: AC
  Administered 2021-07-04: 650 mg via ORAL
  Filled 2021-07-04: qty 2

## 2021-07-04 MED ORDER — METHYLPREDNISOLONE SODIUM SUCC 125 MG IJ SOLR
125.0000 mg | Freq: Once | INTRAMUSCULAR | Status: AC
  Administered 2021-07-04: 125 mg via INTRAVENOUS
  Filled 2021-07-04: qty 2

## 2021-07-04 MED ORDER — IOHEXOL 350 MG/ML SOLN
50.0000 mL | Freq: Once | INTRAVENOUS | Status: AC | PRN
  Administered 2021-07-04: 50 mL via INTRAVENOUS

## 2021-07-04 MED ORDER — LACTATED RINGERS IV SOLN: INTRAVENOUS | Status: DC

## 2021-07-04 NOTE — ED Provider Notes (Signed)
87 year old female received at sign out from [name] pending CT head, UA, and PE study. Per her HPI:   "[name] [name] is a 87 y.o. female with medical history significant for CHF, hypertension,prior PE not on anticoag presents for evaluation of shortness of breath and CHF.  Onset this morning.  Per facility patient has edema in all 4 extremities.  Wears 3 L nasal cannula at baseline.  Recent increase in her Lasix without improvement in her edema.  Patient states she has tightness across her chest.  She feels hot however has not taken her temperature. No back pain, abd pain, dysuria.    Level 5 Caveat- Respiratory distress   Daughter in room provides collateral: Sleeping more over last 2 days. Cough, increased LE edema. Possible slurred speech yesterday. Very weak. Not getting up as usual. Has had episodes like this previously, typically related to fluid overload"  Physical Exam  BP (!) 182/92   Pulse 90   Temp (!) 97.4 F (36.3 C) (Oral)   Resp 14   Wt 102.2 kg   SpO2 99%   BMI 38.67 kg/m   Physical Exam Vitals and nursing note reviewed.  Constitutional:      General: She is not in acute distress.    Comments: Chronically ill-appearing.  Sleeping, but arouses to voice and answers questions.  HENT:     Head: Normocephalic.  Eyes:     Conjunctiva/sclera: Conjunctivae normal.  Cardiovascular:     Rate and Rhythm: Normal rate and regular rhythm.     Heart sounds: No murmur heard.   No friction rub. No gallop.  Pulmonary:     Effort: Pulmonary effort is normal. No respiratory distress.     Comments: Scattered wheezes. Abdominal:     General: There is distension.     Palpations: Abdomen is soft.  Musculoskeletal:     Cervical back: Neck supple.     Right lower leg: Edema present.     Left lower leg: Edema present.     Comments: 3+ pitting edema to the knees  Skin:    General: Skin is warm.     Findings: No rash.  Psychiatric:        Behavior: Behavior normal.    ED  Course/Procedures     .Critical Care  Date/Time: 07/05/2021 5:46 AM Performed by: Barkley Boards, PA-C Authorized by: Barkley Boards, PA-C   Critical care provider statement:    Critical care time (minutes):  35   Critical care time was exclusive of:  Separately billable procedures and treating other patients and teaching time   Critical care was time spent personally by me on the following activities:  Ordering and performing treatments and interventions, ordering and review of laboratory studies, ordering and review of radiographic studies, pulse oximetry, re-evaluation of patient's condition, review of old charts, obtaining history from patient or surrogate, examination of patient, evaluation of patient's response to treatment and development of treatment plan with patient or surrogate   I assumed direction of critical care for this patient from another provider in my specialty: yes     Care discussed with: admitting provider    MDM   87 year old female received as signout from [place] tenderly pending CT head, PE study, and urinalysis.  In brief, this is a patient brought in for shortness of breath.  She had an axillary temp of 101 and sepsis work-up was initiated.  No clear etiology of fever.  However, she had no leukocytosis, lactate was  normal, and overall work-up for infectious etiology has been unremarkable.  Blood cultures have been obtained and she did receive broad-spectrum antibiotics.  In regards to shortness of breath, patient has a history of CHF and COPD.  She was given duo nebs and IV Lasix in the ED.  BNP is elevated to greater than 200.  No significant pulmonary edema on PE study or chest x-ray, but patient does have 3+ pitting edema to the level of her knees and abdominal distention.  PE study is negative for PE.  Head CT is unremarkable.  Urine is not concerning for infection.  Patient will require admission for CHF exacerbation since she has failed a trial of increasing her  home torsemide.  She can also continue to be evaluated for questionable sepsis. The patient was discussed and independently evaluated by Dr. Criss Alvine, attending physician.  Patient was initially on maintenance fluids, which have been discontinued due volume overload.   Consult to the hospitalist team and Dr. Toniann Fail will accept the patient for admission. The patient appears reasonably stabilized for admission considering the current resources, flow, and capabilities available in the ED at this time, and I doubt any other [facility] requiring further screening and/or treatment in the ED prior to admission.       Frederik Pear A, PA-C 07/05/21 1610    Pricilla Loveless, MD 07/05/21 [phone]

## 2021-07-04 NOTE — Progress Notes (Signed)
Pharmacy Antibiotic Note  [name] [name] is a 87 y.o. female admitted on 07/04/2021 with sepsis.  Pharmacy has been consulted for cefepime and vancomycin dosing.  Patient with a history of CHF, hypertension,prior PE not on anticoagulation. Patient presenting from SNF with SOB.  SCr is 1.29 which is above baseline. WBC 10.6; LA 1.1 COVID in progress.  Plan: Metronidazole per MD Cefepime 2g q12h Vancomycin 2000mg  once then 1250 mg q48h unless change in renal function (eAUC 509.8 using SCr 1.29) Monitor renal function - SCr used for calculations is slightly higher than patient's baseline  Monitor cultures De-escalate as appropriate     Temp (24hrs), Avg:101.2 F (38.4 C), Min:101.2 F (38.4 C), Max:101.2 F (38.4 C)  Recent Labs  Lab 07/04/21 1950  WBC 9.0  CREATININE 1.29*  LATICACIDVEN 1.1    CrCl cannot be calculated (Unknown ideal weight.).    Allergies  Allergen Reactions   Clonidine Derivatives Swelling    [name]   Fish Allergy Diarrhea, Swelling and Other (See Comments)    Turns skin "black," but can tolerate white fish Salmon- Diarrhea   Shellfish Allergy Hives   Doxycycline Rash and Other (See Comments)    Dizziness, also   Cetirizine Hcl Itching   Citrus Diarrhea and Other (See Comments)    Indigestion/heartburn, also; "Allergic to oranges" noted on MAR   Indomethacin Other (See Comments)    "Allergic," per MAR   Methyldopa Diarrhea   Morphine And Related Nausea And Vomiting and Other (See Comments)    "Family reports it drops her b/p that she needs iv fluids"   Morphine Sulfate Nausea And Vomiting and Other (See Comments)    "Family reports it drops her b/p that she needs iv fluids"   Pregabalin Nausea Only and Other (See Comments)    Hallucinations, also   Shellfish-Derived Products Itching and Other (See Comments)    Dizziness, also   Strawberry (Diagnostic) Itching    Strawberry Extract Itching and Other (See Comments)    Dizziness, also   Cetirizine Hcl Itching and Rash   Codeine Hives and Itching   Levaquin [Levofloxacin In D5w] Rash   Levofloxacin Rash   Tomato Rash    Antimicrobials this admission: metronidazole 8/26 >>  cefepime 8/26 >>  Vancomycin 8/26 >>  Microbiology results: Pending  Thank you for allowing pharmacy to be a part of this patient's care.  9/26, PharmD, BCPS 07/04/2021 10:15 PM ED Clinical Pharmacist -  [phone]

## 2021-07-04 NOTE — ED Triage Notes (Signed)
Pt from [facility] SN[facility] with hx of shob and CHF here for eval of shob onset this morning. Edema in all four extremities. Wears 3L O2 at baseline, had recent increase in her Lasix dose without improvement.

## 2021-07-04 NOTE — Sepsis Progress Note (Signed)
Monitoring for code sepsis protocol. 

## 2021-07-04 NOTE — ED Provider Notes (Addendum)
[facility] EMERGENCY DEPARTMENT Provider Note   CSN: [id number] Arrival date & time: 07/04/21  1846     History Chief Complaint  Patient presents with   Shortness of Breath    [name] [name] is a 87 y.o. female with medical history significant for CHF, hypertension,prior PE not on anticoag presents for evaluation of shortness of breath and CHF.  Onset this morning.  Per facility patient has edema in all 4 extremities.  Wears 3 L nasal cannula at baseline.  Recent increase in her Lasix without improvement in her edema.  Patient states she has tightness across her chest.  She feels hot however has not taken her temperature. No back pain, abd pain, dysuria.   Level 5 Caveat- Respiratory distress  Daughter in room provides collateral: Sleeping more over last 2 days. Cough, increased LE edema. Possible slurred speech yesterday. Very weak. Not getting up as usual. Has had episodes like this previously, typically related to fluid overload   HPI     Past Medical History:  Diagnosis Date   Allergic rhinitis due to pollen 04/27/2007   Arthritis    "in q joint" (08/07/2013)   CHF (congestive heart failure) (HCC)    Coronary atherosclerosis of native coronary artery    Cough    Edema 05/03/2013   Exertional shortness of breath    "sometimes" (08/07/2013)   First degree atrioventricular block    Gout, unspecified 04/19/2013   Hyperlipidemia 04/27/2007   Hypertension    Midline low back pain without sciatica 06/27/2014   Muscle spasm    Muscle weakness (generalized)    Onychia and paronychia of toe    Osteoarthrosis, unspecified whether generalized or localized, unspecified site 04/27/2007   Other fall    Other malaise and fatigue    Pneumonia 05/2018   Prepatellar bursitis    Pulmonary embolism (HCC) 07/2018   Rheumatic nodule (HCC) 06/28/2017   Seizures (HCC)    Stroke (HCC) 01/17/2014   Type II diabetes mellitus (HCC)    "fasting 90-110s" (08/07/2013)    Unspecified vitamin D deficiency     Patient Active Problem List   Diagnosis Date Noted   Altered mental status 03/26/2021   Rheumatoid arthritis involving multiple sites with positive rheumatoid factor (HCC) 08/27/2020   Near syncope 12/13/2019   Pre-operative clearance 05/29/2019   Volume overload 05/29/2019   Acute metabolic encephalopathy 03/30/2019   Pressure injury of skin 01/12/2019   AMS (altered mental status) 01/09/2019   Rheumatoid arthritis flare (HCC) 12/23/2018   Weakness of right lower extremity 12/23/2018   Abscess of right upper extremity 12/14/2018   Fever 12/13/2018   Hand swelling 12/02/2018   Pain, wrist 12/02/2018   Hand pain 12/02/2018   Polyarthritis 12/02/2018   Spinal stenosis of lumbar region without neurogenic claudication 11/19/2018   Acute encephalopathy 11/19/2018   History of CVA (cerebrovascular accident) 11/19/2018   Acute gout 11/15/2018   Low back pain 11/14/2018   Chronic back pain 10/27/2018   Arthralgia of both knees 10/27/2018   Rheumatoid arthritis involving both knees (HCC) 10/27/2018   Acute pain 10/21/2018   Physical deconditioning 09/06/2018   Hyperlipidemia associated with type 2 diabetes mellitus (HCC), refuses statin 09/06/2018   Refusal of statin medication by patient 09/04/2018   Deep vein thrombosis (DVT) of distal vein of right lower extremity (HCC) 07/30/2018   Hypertensive heart disease with heart failure (HCC) 07/30/2018   Pulmonary embolism (HCC), 07/2018, on Xarelto with goal to DC after 6 months  if more active 07/10/2018   Elevated brain natriuretic peptide (BNP) level    DCM (dilated cardiomyopathy) (HCC)    Benign hypertensive heart and kidney disease with CHF and stage 3 chronic kidney disease (HCC) 05/28/2018   Osteoarthritis 05/06/2018   Hypokalemia 02/02/2018   Adhesive capsulitis of right shoulder 10/12/2017   Idiopathic chronic gout of multiple sites with tophus 08/25/2017   History of total knee arthroplasty,  bilateral 02/18/2017   History of rotator cuff surgery 02/18/2017   High risk medications, long-term use 09/21/2016   Chronic combined systolic and diastolic CHF (congestive heart failure) (HCC) 09/12/2015   Bilateral edema of lower extremity 06/05/2014   CKD stage 3 due to type 2 diabetes mellitus (HCC) 06/05/2014   Type 2 diabetes mellitus with diabetic nephropathy, without long-term current use of insulin (HCC) 06/05/2014    Class: Chronic   Anemia of chronic disease 05/01/2014   CAD (coronary artery disease) 10/18/2013   Hypertension associated with diabetes (HCC) 08/08/2013    Class: Chronic   Fibromyalgia 05/10/2013   Type 2 diabetes mellitus with hyperlipidemia (HCC), patient declines statin 04/27/2007   Allergic rhinitis 04/27/2007   Diverticulosis 04/27/2007    Past Surgical History:  Procedure Laterality Date   ABDOMINAL HYSTERECTOMY  04/1980   APPENDECTOMY  1960   CARDIAC CATHETERIZATION  2003   CATARACT EXTRACTION W/ INTRAOCULAR LENS  IMPLANT, BILATERAL Bilateral ~ 2012   CHOLECYSTECTOMY N/A 06/26/2018   Procedure: LAPAROSCOPIC CHOLECYSTECTOMY POSSIBLE INTRAOPERATIVE CHOLANGIOGRAM;  Surgeon: Abigail Miyamoto, MD;  Location: [facility] OR;  Service: General;  Laterality: N/A;   JOINT REPLACEMENT     REPLACEMENT TOTAL KNEE Right 09/2005   RIGHT/LEFT HEART CATH AND CORONARY ANGIOGRAPHY N/A 06/01/2018   Procedure: RIGHT/LEFT HEART CATH AND CORONARY ANGIOGRAPHY;  Surgeon: Corky Crafts, MD;  Location: [facility] INVASIVE CV LAB;  Service: Cardiovascular;  Laterality: N/A;   SHOULDER OPEN ROTATOR CUFF REPAIR Right 07/1999   TONSILLECTOMY  08/1974   TOTAL KNEE ARTHROPLASTY Left 08/07/2013   Procedure: TOTAL KNEE ARTHROPLASTY- LEFT;  Surgeon: Dannielle Huh, MD;  Location: [facility] OR;  Service: Orthopedics;  Laterality: Left;   TRANSTHORACIC ECHOCARDIOGRAM  2003   EF 55-65%; mild concentric LVH     OB History   No obstetric history on file.     Family History  Problem Relation Age of Onset    Heart attack Father     Social History   Tobacco Use   Smoking status: Never   Smokeless tobacco: Never  Vaping Use   Vaping Use: Never used  Substance Use Topics   Alcohol use: No    Alcohol/week: 0.0 standard drinks   Drug use: No    Home Medications Prior to Admission medications   Medication Sig Start Date End Date Taking? Authorizing Provider  albuterol (VENTOLIN HFA) 108 (90 Base) MCG/ACT inhaler Inhale 2 puffs into the lungs every 6 (six) hours as needed for wheezing or shortness of breath.    [provider]  allopurinol (ZYLOPRIM) 100 MG tablet Take 2 tablets (200 mg total) by mouth daily. 03/28/21 04/27/21  Quincy Simmonds, MD  amLODipine (NORVASC) 2.5 MG tablet Take 1 tablet (2.5 mg total) by mouth as needed. For SBP >180 03/28/21 03/28/22  Quincy Simmonds, MD  amLODipine (NORVASC) 5 MG tablet Take 1 tablet (5 mg total) by mouth daily. 03/28/21 03/28/22  Quincy Simmonds, MD  Cholecalciferol (D3-50) 1.25 MG (50000 UT) capsule Take 50,000 Units by mouth every 30 (thirty) days.    [provider]  fluticasone (  FLONASE) 50 MCG/ACT nasal spray Place 2 sprays into both nostrils in the morning and at bedtime.    [provider]  gabapentin (NEURONTIN) 100 MG capsule Take 100-200 mg by mouth See admin instructions. Take 1 capsule by mouth twice daily, and 2 capsules at bedtime    [provider]  hydroxychloroquine (PLAQUENIL) 200 MG tablet Take 1 tablet (200 mg total) by mouth 2 (two) times daily. 12/20/18   Laverna Peace, MD  Lactobacillus Acid-Pectin (ACIDOPHILUS/PECTIN) CAPS Take 1 capsule by mouth daily.    [provider]  leflunomide (ARAVA) 20 MG tablet Take 20 mg by mouth every evening.    [provider]  loratadine (CLARITIN) 10 MG tablet Take 10 mg by mouth at bedtime.    [provider]  Menthol, Topical Analgesic, (BIOFREEZE) 4 % GEL Apply 1 application topically 4 (four) times daily. Bilateral shoulders and  knees    [provider]  Olopatadine HCl 0.2 % SOLN INSTILL 1 DROP IN BOTH EYES DAILY Patient taking differently: Place 1 drop into both eyes every evening. 02/19/20   Cirigliano, Jearld Lesch, DO  Omega 3-6-9 Fatty Acids (OMEGA 3-6-9 COMPLEX) CAPS Take 1 capsule by mouth every evening. Omega 3 Black    [provider]  senna-docusate (SENOKOT-S) 8.6-50 MG tablet Take 1-2 tablets by mouth See admin instructions. Take 2 tablets by mouth in the morning and in the evening. Take 1 tablet at bedtime    [provider]  Vitamin D, Ergocalciferol, (DRISDOL) 1.25 MG (50000 UNIT) CAPS capsule Take 50,000 Units by mouth every 30 (thirty) days.    [provider]    Allergies    Clonidine derivatives, Fish allergy, Shellfish allergy, Doxycycline, Cetirizine hcl, Citrus, Indomethacin, Methyldopa, Morphine and related, Morphine sulfate, Pregabalin, Shellfish-derived products, Strawberry (diagnostic), Strawberry extract, Cetirizine hcl, Codeine, Levaquin [levofloxacin in d5w], Levofloxacin, and Tomato  Review of Systems   Review of Systems  Constitutional:  Positive for chills. Negative for fever.  HENT: Negative.    Eyes:  Negative for pain and visual disturbance.  Respiratory:  Positive for cough, chest tightness and shortness of breath.   Cardiovascular:  Positive for leg swelling. Negative for chest pain and palpitations.  Gastrointestinal: Negative.   Genitourinary: Negative.  Negative for difficulty urinating, dysuria and hematuria.  Musculoskeletal: Negative.   Skin: Negative.   Neurological:  Negative for seizures and syncope.  All other systems reviewed and are negative.  Physical Exam Updated Vital Signs BP (!) 174/91 (BP Location: Right Arm)   Pulse 88   Temp (!) 101.2 F (38.4 C) (Axillary)   Resp 18   Wt 102.2 kg   SpO2 100%   BMI 38.67 kg/m   Physical Exam Vitals and nursing note reviewed.  Constitutional:      General: She is in acute distress.      Appearance: She is well-developed. She is obese. She is ill-appearing.  HENT:     Head: Normocephalic and atraumatic.  Eyes:     Pupils: Pupils are equal, round, and reactive to light.  Cardiovascular:     Rate and Rhythm: Normal rate.     Pulses:          Radial pulses are 2+ on the right side and 2+ on the left side.       Dorsalis pedis pulses are 1+ on the right side and 1+ on the left side.     Heart sounds: Normal heart sounds.  Pulmonary:  Effort: Tachypnea present.     Breath sounds: Decreased breath sounds and wheezing present.     Comments: Upper airway wheeze Chest:     Comments: Equal rise and fall to chest wall Abdominal:     General: Bowel sounds are normal. There is no distension.     Palpations: Abdomen is soft.     Tenderness: There is no abdominal tenderness. There is no right CVA tenderness, left CVA tenderness, guarding or rebound.     Hernia: No hernia is present.  Musculoskeletal:        General: Normal range of motion.     Cervical back: Normal range of motion.     Right lower leg: No tenderness. Edema present.     Left lower leg: No tenderness. Edema present.     Comments: No bony tenderness.  Moves all 4 extremities without difficulty.  Feet:     Comments: 1-2+ pitting edema to knees bilaterally Skin:    General: Skin is warm and dry.     Capillary Refill: Capillary refill takes 2 to 3 seconds.     Comments: No erythema or warmth.  Neurological:     General: No focal deficit present.     Mental Status: She is alert.     Cranial Nerves: Cranial nerves are intact.     Sensory: Sensation is intact.     Motor: Weakness present.     Comments: CN 2-12 grossly intact A/0x3 Generalized weakness  Psychiatric:        Mood and Affect: Mood normal.    ED Results / Procedures / Treatments   Labs (all labs ordered are listed, but only abnormal results are displayed) Labs Reviewed  COMPREHENSIVE METABOLIC PANEL - Abnormal; Notable for the following  components:      Result Value   BUN 33 (*)    Creatinine, Ser 1.29 (*)    Calcium 8.7 (*)    Total Protein 6.2 (*)    Albumin 3.3 (*)    AST 13 (*)    GFR, Estimated 40 (*)    All other components within normal limits  CBC WITH DIFFERENTIAL/PLATELET - Abnormal; Notable for the following components:   RBC 3.38 (*)    Hemoglobin 10.6 (*)    HCT 33.3 (*)    RDW 18.5 (*)    All other components within normal limits  TROPONIN I (HIGH SENSITIVITY) - Abnormal; Notable for the following components:   Troponin I (High Sensitivity) 35 (*)    All other components within normal limits  RESP PANEL BY RT-PCR (FLU A&B, COVID) ARPGX2  URINE CULTURE  CULTURE, BLOOD (ROUTINE X 2)  CULTURE, BLOOD (ROUTINE X 2)  LACTIC ACID, PLASMA  LACTIC ACID, PLASMA  URINALYSIS, ROUTINE W REFLEX MICROSCOPIC  BRAIN NATRIURETIC PEPTIDE  PROTIME-INR  APTT  TROPONIN I (HIGH SENSITIVITY)    EKG EKG Interpretation  Date/Time:  Friday July 04 2021 19:37:09 EDT Ventricular Rate:  101 PR Interval:  194 QRS Duration: 104 QT Interval:  376 QTC Calculation: 488 R Axis:   -53 Text Interpretation: Sinus tachycardia Left anterior fascicular block LVH with secondary repolarization abnormality Anterior Q waves, possibly due to LVH Confirmed by Kristine Royal [phone]) on 07/04/2021 7:53:34 PM  Radiology CT HEAD WO CONTRAST ( )  Result Date: 07/04/2021 CLINICAL DATA:  Altered mental status. EXAM: CT HEAD WITHOUT CONTRAST TECHNIQUE: Contiguous axial images were obtained from the base of the skull through the vertex without intravenous contrast. COMPARISON:  Mar 26, 2021 FINDINGS: Brain:  There is mild cerebral atrophy with widening of the extra-axial spaces and ventricular dilatation. There are areas of decreased attenuation within the white matter tracts of the supratentorial brain, consistent with microvascular disease changes. Small, chronic bilateral basal ganglia lacunar infarcts are seen. Vascular: No hyperdense  vessel or unexpected calcification. Skull: Normal. Negative for fracture or focal lesion. Sinuses/Orbits: No acute finding. Other: None. IMPRESSION: 1. Generalized cerebral atrophy. 2. No acute intracranial abnormality. Electronically Signed   By: Aram Candela [name]   On: 07/04/2021 23:39   DG Chest Port 1 View  Result Date: 07/04/2021 CLINICAL DATA:  Shortness of breath EXAM: PORTABLE CHEST 1 VIEW COMPARISON:  03/26/2021 FINDINGS: Cardiomegaly. No focal opacity, pleural effusion or pneumothorax. Chronic deformity of left humeral head. IMPRESSION: No active disease.  Cardiomegaly Electronically Signed   By: Jasmine Pang [name]   On: 07/04/2021 19:54    Procedures .Critical Care  Date/Time: 07/04/2021 11:48 PM Performed by: Linwood Dibbles, PA-C Authorized by: Linwood Dibbles, PA-C   Critical care provider statement:    Critical care time (minutes):  45   Critical care was necessary to treat or prevent imminent or life-threatening deterioration of the following conditions:  Sepsis   Critical care was time spent personally by me on the following activities:  Discussions with consultants, evaluation of patient's response to treatment, examination of patient, ordering and performing treatments and interventions, ordering and review of laboratory studies, ordering and review of radiographic studies, pulse oximetry, re-evaluation of patient's condition, obtaining history from patient or surrogate and review of old charts   Medications Ordered in ED Medications  lactated ringers infusion (has no administration in time range)  ceFEPIme (MAXIPIME) 2 g in sodium chloride 0.9 % 100 mL IVPB (has no administration in time range)  metroNIDAZOLE (FLAGYL) IVPB 500 mg (has no administration in time range)  vancomycin (VANCOREADY) IVPB 2000 mg/400 mL (has no administration in time range)  furosemide (LASIX) injection 60 mg (has no administration in time range)  ceFEPIme (MAXIPIME) 2 g in sodium  chloride 0.9 % 100 mL IVPB (has no administration in time range)  vancomycin (VANCOREADY) IVPB 1250 mg/250 mL (has no administration in time range)  methylPREDNISolone sodium succinate (SOLU-MEDROL) 125 mg/2 mL injection 125 mg (125 mg Intravenous Given 07/04/21 1952)  albuterol (VENTOLIN HFA) 108 (90 Base) MCG/ACT inhaler 2 puff (2 puffs Inhalation Given 07/04/21 1936)  acetaminophen (TYLENOL) tablet 650 mg (650 mg Oral Given 07/04/21 1937)  ipratropium-albuterol (DUONEB) 0.5-2.5 (3) MG/3ML nebulizer solution 3 mL (3 mLs Nebulization Given 07/04/21 2132)  iohexol (OMNIPAQUE) 350 MG/ML injection 50 mL (50 mLs Intravenous Contrast Given 07/04/21 2323)   ED Course  I have reviewed the triage vital signs and the nursing notes.  Pertinent labs & imaging results that were available during my care of the patient were reviewed by me and considered in my medical decision making (see chart for details).  87 year old here for evaluation of shortness of breath.  On arrival she is febrile, appears chronically ill.  She has upper airway wheeze.  She is on her home 3 L of oxygen however does appear short of breath.  She has tachypnea.  Abdomen soft, nontender.  Lower extremities appear fluid overloaded.  Initial hold on code sepsis until labs return.  Patient reassessed.  She does appear ill.  Code sepsis called after vital signs obtained as she meet SIRS criteria.  Broad-spectrum antibiotics for unknown source.  Chest x-ray with cardiomegaly however no edema, no infiltrate CBC without  leukocytosis Metabolic panel creatinine 1.29 Trop 35  Lactic acid 1.1 EKG without ischemic changes, old q waves, sinus tachycardia  COVID/ Flu neg  Daughter not here to provide additional information.  States patient has been very lethargic, not up and about over the last day or so.  Has cough, increased lower extremity edema despite increasing Lasix at home. Unsure of amount patient is on currently. Possible slurred speech  yesterday.  No deficits here currently aside for some global weakness. We will add on head CT.  Daughter does state patient has history of PE not anticoagulated, will add.  She is full range of motion to her neck I have suspicion for meningitis, neck infection, epiglottitis  Care transferred to McDonald, PA-C who will FU on imaging and dispo. Will needs admission.  Patient seen evaluated by attending, Dr. Rodena Medin who agrees with above treatment, plan and disposition    MDM Rules/Calculators/A&P                            Final Clinical Impression(s) / ED Diagnoses Final diagnoses:  SIRS (systemic inflammatory response syndrome) (HCC)  Leg edema  Wheeze  Weakness    Rx / DC Orders ED Discharge Orders     None        Devante Capano A, PA-C 07/04/21 2326    Tinslee Klare A, PA-C 07/04/21 2349    Wynetta Fines, MD 07/05/21 0001

## 2021-07-05 ENCOUNTER — Inpatient Hospital Stay (HOSPITAL_COMMUNITY): Payer: Medicare Other

## 2021-07-05 ENCOUNTER — Encounter (HOSPITAL_COMMUNITY): Payer: Self-pay | Admitting: Internal Medicine

## 2021-07-05 DIAGNOSIS — D631 Anemia in chronic kidney disease: Secondary | ICD-10-CM | POA: Diagnosis present

## 2021-07-05 DIAGNOSIS — M069 Rheumatoid arthritis, unspecified: Secondary | ICD-10-CM | POA: Diagnosis present

## 2021-07-05 DIAGNOSIS — Z8673 Personal history of transient ischemic attack (TIA), and cerebral infarction without residual deficits: Secondary | ICD-10-CM

## 2021-07-05 DIAGNOSIS — I5023 Acute on chronic systolic (congestive) heart failure: Secondary | ICD-10-CM | POA: Diagnosis present

## 2021-07-05 DIAGNOSIS — E871 Hypo-osmolality and hyponatremia: Secondary | ICD-10-CM | POA: Diagnosis present

## 2021-07-05 DIAGNOSIS — E2740 Unspecified adrenocortical insufficiency: Secondary | ICD-10-CM | POA: Diagnosis present

## 2021-07-05 DIAGNOSIS — Z515 Encounter for palliative care: Secondary | ICD-10-CM | POA: Diagnosis not present

## 2021-07-05 DIAGNOSIS — N183 Chronic kidney disease, stage 3 unspecified: Secondary | ICD-10-CM | POA: Diagnosis not present

## 2021-07-05 DIAGNOSIS — I5031 Acute diastolic (congestive) heart failure: Secondary | ICD-10-CM

## 2021-07-05 DIAGNOSIS — Z7189 Other specified counseling: Secondary | ICD-10-CM | POA: Diagnosis not present

## 2021-07-05 DIAGNOSIS — J9601 Acute respiratory failure with hypoxia: Secondary | ICD-10-CM | POA: Diagnosis present

## 2021-07-05 DIAGNOSIS — M7989 Other specified soft tissue disorders: Secondary | ICD-10-CM

## 2021-07-05 DIAGNOSIS — E1122 Type 2 diabetes mellitus with diabetic chronic kidney disease: Secondary | ICD-10-CM

## 2021-07-05 DIAGNOSIS — K219 Gastro-esophageal reflux disease without esophagitis: Secondary | ICD-10-CM | POA: Diagnosis present

## 2021-07-05 DIAGNOSIS — N1831 Chronic kidney disease, stage 3a: Secondary | ICD-10-CM | POA: Diagnosis present

## 2021-07-05 DIAGNOSIS — E876 Hypokalemia: Secondary | ICD-10-CM | POA: Diagnosis present

## 2021-07-05 DIAGNOSIS — N141 Nephropathy induced by other drugs, medicaments and biological substances: Secondary | ICD-10-CM | POA: Diagnosis present

## 2021-07-05 DIAGNOSIS — Z20822 Contact with and (suspected) exposure to covid-19: Secondary | ICD-10-CM | POA: Diagnosis present

## 2021-07-05 DIAGNOSIS — R600 Localized edema: Secondary | ICD-10-CM

## 2021-07-05 DIAGNOSIS — G9341 Metabolic encephalopathy: Secondary | ICD-10-CM | POA: Diagnosis present

## 2021-07-05 DIAGNOSIS — R6510 Systemic inflammatory response syndrome (SIRS) of non-infectious origin without acute organ dysfunction: Secondary | ICD-10-CM | POA: Diagnosis not present

## 2021-07-05 DIAGNOSIS — T508X5A Adverse effect of diagnostic agents, initial encounter: Secondary | ICD-10-CM | POA: Diagnosis present

## 2021-07-05 DIAGNOSIS — I2510 Atherosclerotic heart disease of native coronary artery without angina pectoris: Secondary | ICD-10-CM | POA: Diagnosis present

## 2021-07-05 DIAGNOSIS — R627 Adult failure to thrive: Secondary | ICD-10-CM | POA: Diagnosis present

## 2021-07-05 DIAGNOSIS — Z6838 Body mass index (BMI) 38.0-38.9, adult: Secondary | ICD-10-CM | POA: Diagnosis not present

## 2021-07-05 DIAGNOSIS — R0602 Shortness of breath: Secondary | ICD-10-CM | POA: Diagnosis not present

## 2021-07-05 DIAGNOSIS — N179 Acute kidney failure, unspecified: Secondary | ICD-10-CM | POA: Diagnosis present

## 2021-07-05 DIAGNOSIS — E785 Hyperlipidemia, unspecified: Secondary | ICD-10-CM | POA: Diagnosis present

## 2021-07-05 DIAGNOSIS — I82592 Chronic embolism and thrombosis of other specified deep vein of left lower extremity: Secondary | ICD-10-CM | POA: Diagnosis present

## 2021-07-05 DIAGNOSIS — Z8701 Personal history of pneumonia (recurrent): Secondary | ICD-10-CM | POA: Diagnosis not present

## 2021-07-05 DIAGNOSIS — E872 Acidosis: Secondary | ICD-10-CM | POA: Diagnosis present

## 2021-07-05 DIAGNOSIS — I130 Hypertensive heart and chronic kidney disease with heart failure and stage 1 through stage 4 chronic kidney disease, or unspecified chronic kidney disease: Secondary | ICD-10-CM | POA: Diagnosis present

## 2021-07-05 DIAGNOSIS — Z66 Do not resuscitate: Secondary | ICD-10-CM | POA: Diagnosis present

## 2021-07-05 DIAGNOSIS — I509 Heart failure, unspecified: Secondary | ICD-10-CM

## 2021-07-05 DIAGNOSIS — I5043 Acute on chronic combined systolic (congestive) and diastolic (congestive) heart failure: Secondary | ICD-10-CM | POA: Diagnosis present

## 2021-07-05 LAB — CBC
HCT: 34.4 % — ABNORMAL LOW (ref 36.0–46.0)
Hemoglobin: 10.5 g/dL — ABNORMAL LOW (ref 12.0–15.0)
MCH: 30.4 pg (ref 26.0–34.0)
MCHC: 30.5 g/dL (ref 30.0–36.0)
MCV: 99.7 fL (ref 80.0–100.0)
Platelets: 210 10*3/uL (ref 150–400)
RBC: 3.45 MIL/uL — ABNORMAL LOW (ref 3.87–5.11)
RDW: 18.5 % — ABNORMAL HIGH (ref 11.5–15.5)
WBC: 11.7 10*3/uL — ABNORMAL HIGH (ref 4.0–10.5)
nRBC: 0 % (ref 0.0–0.2)

## 2021-07-05 LAB — URINALYSIS, ROUTINE W REFLEX MICROSCOPIC
Bilirubin Urine: NEGATIVE
Glucose, UA: NEGATIVE mg/dL
Hgb urine dipstick: NEGATIVE
Ketones, ur: 5 mg/dL — AB
Leukocytes,Ua: NEGATIVE
Nitrite: NEGATIVE
Protein, ur: NEGATIVE mg/dL
Specific Gravity, Urine: 1.008 (ref 1.005–1.030)
pH: 5 (ref 5.0–8.0)

## 2021-07-05 LAB — BRAIN NATRIURETIC PEPTIDE: B Natriuretic Peptide: 2067.1 pg/mL — ABNORMAL HIGH (ref 0.0–100.0)

## 2021-07-05 LAB — CBG MONITORING, ED
Glucose-Capillary: 141 mg/dL — ABNORMAL HIGH (ref 70–99)
Glucose-Capillary: 181 mg/dL — ABNORMAL HIGH (ref 70–99)

## 2021-07-05 LAB — PROTIME-INR
INR: 1.2 (ref 0.8–1.2)
Prothrombin Time: 15.2 seconds (ref 11.4–15.2)

## 2021-07-05 LAB — CREATININE, SERUM
Creatinine, Ser: 1.18 mg/dL — ABNORMAL HIGH (ref 0.44–1.00)
GFR, Estimated: 44 mL/min — ABNORMAL LOW (ref >60)

## 2021-07-05 LAB — GLUCOSE, CAPILLARY: Glucose-Capillary: 136 mg/dL — ABNORMAL HIGH (ref 70–99)

## 2021-07-05 LAB — APTT: aPTT: 35 seconds (ref 24–36)

## 2021-07-05 LAB — TROPONIN I (HIGH SENSITIVITY): Troponin I (High Sensitivity): 35 ng/L — ABNORMAL HIGH (ref <18)

## 2021-07-05 MED ORDER — ACETAMINOPHEN 650 MG RE SUPP: 650.0000 mg | Freq: Four times a day (QID) | RECTAL | Status: DC | PRN

## 2021-07-05 MED ORDER — HYDROXYCHLOROQUINE SULFATE 200 MG PO TABS
200.0000 mg | ORAL_TABLET | Freq: Two times a day (BID) | ORAL | Status: DC
  Administered 2021-07-05 – 2021-07-16 (×23): 200 mg via ORAL
  Filled 2021-07-05 (×24): qty 1

## 2021-07-05 MED ORDER — SENNOSIDES-DOCUSATE SODIUM 8.6-50 MG PO TABS
2.0000 | ORAL_TABLET | Freq: Every day | ORAL | Status: DC
  Administered 2021-07-05 – 2021-07-15 (×6): 2 via ORAL
  Filled 2021-07-05 (×6): qty 2

## 2021-07-05 MED ORDER — LORAZEPAM 2 MG/ML IJ SOLN: 1.0000 mg | Freq: Once | INTRAMUSCULAR | Status: DC

## 2021-07-05 MED ORDER — FUROSEMIDE 10 MG/ML IJ SOLN: 40.0000 mg | Freq: Two times a day (BID) | INTRAMUSCULAR | Status: DC

## 2021-07-05 MED ORDER — SENNOSIDES-DOCUSATE SODIUM 8.6-50 MG PO TABS: 1.0000 | ORAL_TABLET | ORAL | Status: DC

## 2021-07-05 MED ORDER — LEFLUNOMIDE 20 MG PO TABS
20.0000 mg | ORAL_TABLET | Freq: Every evening | ORAL | Status: DC
  Administered 2021-07-05 – 2021-07-15 (×11): 20 mg via ORAL
  Filled 2021-07-05 (×12): qty 1

## 2021-07-05 MED ORDER — HYDRALAZINE HCL 20 MG/ML IJ SOLN
10.0000 mg | INTRAMUSCULAR | Status: DC | PRN
  Administered 2021-07-05 – 2021-07-08 (×2): 10 mg via INTRAVENOUS
  Filled 2021-07-05 (×2): qty 1

## 2021-07-05 MED ORDER — ACETAMINOPHEN 325 MG PO TABS
650.0000 mg | ORAL_TABLET | Freq: Four times a day (QID) | ORAL | Status: DC | PRN
  Administered 2021-07-10 – 2021-07-13 (×3): 650 mg via ORAL
  Filled 2021-07-05 (×3): qty 2

## 2021-07-05 MED ORDER — IPRATROPIUM-ALBUTEROL 0.5-2.5 (3) MG/3ML IN SOLN
3.0000 mL | Freq: Once | RESPIRATORY_TRACT | Status: AC
  Administered 2021-07-05: 3 mL via RESPIRATORY_TRACT
  Filled 2021-07-05: qty 3

## 2021-07-05 MED ORDER — INSULIN ASPART 100 UNIT/ML IJ SOLN
0.0000 [IU] | Freq: Three times a day (TID) | INTRAMUSCULAR | Status: DC
  Administered 2021-07-09 – 2021-07-15 (×5): 1 [IU] via SUBCUTANEOUS

## 2021-07-05 MED ORDER — FUROSEMIDE 10 MG/ML IJ SOLN: 80.0000 mg | Freq: Two times a day (BID) | INTRAMUSCULAR | Status: DC

## 2021-07-05 MED ORDER — HYDRALAZINE HCL 20 MG/ML IJ SOLN
5.0000 mg | Freq: Four times a day (QID) | INTRAMUSCULAR | Status: AC
  Administered 2021-07-05 – 2021-07-06 (×3): 5 mg via INTRAVENOUS
  Filled 2021-07-05 (×4): qty 1

## 2021-07-05 MED ORDER — SENNOSIDES-DOCUSATE SODIUM 8.6-50 MG PO TABS
1.0000 | ORAL_TABLET | Freq: Two times a day (BID) | ORAL | Status: DC
  Administered 2021-07-05 – 2021-07-13 (×8): 1 via ORAL
  Filled 2021-07-05 (×11): qty 1

## 2021-07-05 MED ORDER — FUROSEMIDE 10 MG/ML IJ SOLN
80.0000 mg | Freq: Two times a day (BID) | INTRAMUSCULAR | Status: AC
  Administered 2021-07-05 (×2): 80 mg via INTRAVENOUS
  Filled 2021-07-05 (×2): qty 8

## 2021-07-05 MED ORDER — RISAQUAD PO CAPS
1.0000 | ORAL_CAPSULE | Freq: Every day | ORAL | Status: DC
  Administered 2021-07-05 – 2021-07-16 (×12): 1 via ORAL
  Filled 2021-07-05 (×12): qty 1

## 2021-07-05 MED ORDER — ENOXAPARIN SODIUM 40 MG/0.4ML IJ SOSY
40.0000 mg | PREFILLED_SYRINGE | Freq: Every day | INTRAMUSCULAR | Status: DC
  Administered 2021-07-05 – 2021-07-07 (×3): 40 mg via SUBCUTANEOUS
  Filled 2021-07-05 (×3): qty 0.4

## 2021-07-05 NOTE — H&P (Signed)
History and Physical    [name] [name] ZOX:[id number] DOB: 1930-02-09 DOA: 07/04/2021  PCP: Pcp, No  Patient coming from: Skilled nursing facility.  History obtained from patient's daughter, patient, ER physician and previous record.  Chief Complaint: Shortness of breath and confusion.  HPI: [name] [name] is a 87 y.o. female with history of chronic combined systolic and diastolic heart failure last EF measured was 60 to 65% on May 2022 with history of rheumatoid arthritis bedbound, hypertension, CAD prior history of PE presently not on anticoagulation was brought to the ER after patient was found to be increasingly short of breath and confused.  Per daughter patient has been getting more short of breath over the last few weeks which acutely worsened yesterday.  Over the last 24 hours patient was also confused and finding it difficult to talk.  Patient has developed significant lower extremity edema over the last few weeks.  [name] diuretic was changed to torsemide last month by patient's primary care physician.  Per patient's daughter patient also had at least 2 bouts of pneumonia over the last 3 months.  ED Course: In the ER patient was febrile initially.  CT angiogram of the chest and CT head was done which were unremarkable.  There was no source of infection and subsequently which temperatures came down.  On exam patient has significant bilateral lower extremity edema 3+.  Labs show BNP of 2000 and high sensitive troponins was flat at 35 and 35.  Creatinine is mildly increased from baseline and it is around 1.2 and hemoglobin 10.6.  COVID test was negative.  Patient was started on Lasix admitted for acute CHF.  Review of Systems: As per HPI, rest all negative.   Past Medical History:  Diagnosis Date   Allergic rhinitis due to pollen 04/27/2007   Arthritis    "in q joint" (08/07/2013)   CHF (congestive heart failure) (HCC)    Coronary atherosclerosis of native coronary artery     Cough    Edema 05/03/2013   Exertional shortness of breath    "sometimes" (08/07/2013)   First degree atrioventricular block    Gout, unspecified 04/19/2013   Hyperlipidemia 04/27/2007   Hypertension    Midline low back pain without sciatica 06/27/2014   Muscle spasm    Muscle weakness (generalized)    Onychia and paronychia of toe    Osteoarthrosis, unspecified whether generalized or localized, unspecified site 04/27/2007   Other fall    Other malaise and fatigue    Pneumonia 05/2018   Prepatellar bursitis    Pulmonary embolism (HCC) 07/2018   Rheumatic nodule (HCC) 06/28/2017   Seizures (HCC)    Stroke (HCC) 01/17/2014   Type II diabetes mellitus (HCC)    "fasting 90-110s" (08/07/2013)   Unspecified vitamin D deficiency     Past Surgical History:  Procedure Laterality Date   ABDOMINAL HYSTERECTOMY  04/1980   APPENDECTOMY  1960   CARDIAC CATHETERIZATION  2003   CATARACT EXTRACTION W/ INTRAOCULAR LENS  IMPLANT, BILATERAL Bilateral ~ 2012   CHOLECYSTECTOMY N/A 06/26/2018   Procedure: LAPAROSCOPIC CHOLECYSTECTOMY POSSIBLE INTRAOPERATIVE CHOLANGIOGRAM;  Surgeon: Abigail Miyamoto, MD;  Location: [facility] OR;  Service: General;  Laterality: N/A;   JOINT REPLACEMENT     REPLACEMENT TOTAL KNEE Right 09/2005   RIGHT/LEFT HEART CATH AND CORONARY ANGIOGRAPHY N/A 06/01/2018   Procedure: RIGHT/LEFT HEART CATH AND CORONARY ANGIOGRAPHY;  Surgeon: Corky Crafts, MD;  Location: [facility] INVASIVE CV LAB;  Service: Cardiovascular;  Laterality: N/A;   SHOULDER  OPEN ROTATOR CUFF REPAIR Right 07/1999   TONSILLECTOMY  08/1974   TOTAL KNEE ARTHROPLASTY Left 08/07/2013   Procedure: TOTAL KNEE ARTHROPLASTY- LEFT;  Surgeon: Dannielle Huh, MD;  Location: [facility] OR;  Service: Orthopedics;  Laterality: Left;   TRANSTHORACIC ECHOCARDIOGRAM  2003   EF 55-65%; mild concentric LVH     reports that she has never smoked. She has never used smokeless tobacco. She reports that she does not drink alcohol and does not use  drugs.  Allergies  Allergen Reactions   Clonidine Derivatives Swelling    [name]   Fish Allergy Diarrhea, Swelling and Other (See Comments)    Turns skin "black," but can tolerate white fish Salmon- Diarrhea   Shellfish Allergy Hives   Doxycycline Rash and Other (See Comments)    Dizziness, also   Cetirizine Hcl Itching   Citrus Diarrhea and Other (See Comments)    Indigestion/heartburn, also; "Allergic to oranges" noted on MAR   Indomethacin Other (See Comments)    "Allergic," per MAR   Methyldopa Diarrhea   Morphine And Related Nausea And Vomiting and Other (See Comments)    "Family reports it drops her b/p that she needs iv fluids"   Morphine Sulfate Nausea And Vomiting and Other (See Comments)    "Family reports it drops her b/p that she needs iv fluids"   Pregabalin Nausea Only and Other (See Comments)    Hallucinations, also   Shellfish-Derived Products Itching and Other (See Comments)    Dizziness, also   Strawberry (Diagnostic) Itching   Strawberry Extract Itching and Other (See Comments)    Dizziness, also   Cetirizine Hcl Itching and Rash   Codeine Hives and Itching   Levaquin [Levofloxacin In D5w] Rash   Levofloxacin Rash   Tomato Rash    Family History  Problem Relation Age of Onset   Heart attack Father     Prior to Admission medications   Medication Sig Start Date End Date Taking? Authorizing Provider  albuterol (VENTOLIN HFA) 108 (90 Base) MCG/ACT inhaler Inhale 2 puffs into the lungs every 6 (six) hours as needed for wheezing or shortness of breath.    [provider]  allopurinol (ZYLOPRIM) 100 MG tablet Take 2 tablets (200 mg total) by mouth daily. 03/28/21 04/27/21  Quincy Simmonds, MD  amLODipine (NORVASC) 2.5 MG tablet Take 1 tablet (2.5 mg total) by mouth as needed. For SBP >180 03/28/21 03/28/22  Quincy Simmonds, MD  amLODipine (NORVASC) 5 MG tablet Take 1 tablet (5 mg  total) by mouth daily. 03/28/21 03/28/22  Quincy Simmonds, MD  Cholecalciferol (D3-50) 1.25 MG (50000 UT) capsule Take 50,000 Units by mouth every 30 (thirty) days.    [provider]  fluticasone (FLONASE) 50 MCG/ACT nasal spray Place 2 sprays into both nostrils in the morning and at bedtime.    [provider]  gabapentin (NEURONTIN) 100 MG capsule Take 100-200 mg by mouth See admin instructions. Take 1 capsule by mouth twice daily, and 2 capsules at bedtime    [provider]  hydroxychloroquine (PLAQUENIL) 200 MG tablet Take 1 tablet (200 mg total) by mouth 2 (two) times daily. 12/20/18   Laverna Peace, MD  Lactobacillus Acid-Pectin (ACIDOPHILUS/PECTIN) CAPS Take 1 capsule by mouth daily.    [provider]  leflunomide (ARAVA) 20 MG tablet Take 20 mg by mouth every evening.    [provider]  loratadine (CLARITIN) 10 MG tablet Take 10 mg by  mouth at bedtime.    [provider]  Menthol, Topical Analgesic, (BIOFREEZE) 4 % GEL Apply 1 application topically 4 (four) times daily. Bilateral shoulders and knees    [provider]  Olopatadine HCl 0.2 % SOLN INSTILL 1 DROP IN BOTH EYES DAILY Patient taking differently: Place 1 drop into both eyes every evening. 02/19/20   Cirigliano, Jearld Lesch, DO  Omega 3-6-9 Fatty Acids (OMEGA 3-6-9 COMPLEX) CAPS Take 1 capsule by mouth every evening. Omega 3 Black    [provider]  senna-docusate (SENOKOT-S) 8.6-50 MG tablet Take 1-2 tablets by mouth See admin instructions. Take 2 tablets by mouth in the morning and in the evening. Take 1 tablet at bedtime    [provider]  Vitamin D, Ergocalciferol, (DRISDOL) 1.25 MG (50000 UNIT) CAPS capsule Take 50,000 Units by mouth every 30 (thirty) days.    [provider]    Physical Exam: Constitutional: Moderately built and nourished. Vitals:   07/05/21 0459 07/05/21 0500 07/05/21 0515 07/05/21 0530  BP: (!) 179/96 (!) 175/96 (!)  182/92 (!) 170/93  Pulse: 85 85 90 82  Resp: Temp:      TempSrc:      SpO2: 100% 100% 99% 100%  Weight:       Eyes: Anicteric no pallor. ENMT: No discharge from the ears eyes nose and mouth. Neck: No mass felt.  No neck rigidity. Respiratory: No rhonchi or crepitations. Cardiovascular: S1-S2 heard. Abdomen: Soft nontender bowel sound present. Musculoskeletal: Bilateral lower extremity edema present. Skin: No rash. Neurologic: Alert awake oriented to time place and person.  Moves all extremities. Psychiatric: Appears normal.  Normal affect.   Labs on Admission: I have personally reviewed following labs and imaging studies  CBC: Recent Labs  Lab 07/04/21 1950  WBC 9.0  NEUTROABS 6.7  HGB 10.6*  HCT 33.3*  MCV 98.5  PLT 209   Basic Metabolic Panel: Recent Labs  Lab 07/04/21 1950  NA 137  K 3.6  CL 104  CO2 25  GLUCOSE 96  BUN 33*  CREATININE 1.29*  CALCIUM 8.7*   GFR: Estimated Creatinine Clearance: 34.4 mL/min (A) (by C-G formula based on SCr of 1.29 mg/dL (H)). Liver Function Tests: Recent Labs  Lab 07/04/21 1950  AST 13*  ALT 13  ALKPHOS 58  BILITOT 0.7  PROT 6.2*  ALBUMIN 3.3*   No results for input(s): LIPASE, AMYLASE in the last 168 hours. No results for input(s): AMMONIA in the last 168 hours. Coagulation Profile: Recent Labs  Lab 07/05/21 0005  INR 1.2   Cardiac Enzymes: No results for input(s): CKTOTAL, CKMB, CKMBINDEX, TROPONINI in the last 168 hours. BNP (last 3 results) No results for input(s): PROBNP in the last 8760 hours. HbA1C: No results for input(s): HGBA1C in the last 72 hours. CBG: No results for input(s): GLUCAP in the last 168 hours. Lipid Profile: No results for input(s): CHOL, HDL, LDLCALC, TRIG, CHOLHDL, LDLDIRECT in the last 72 hours. Thyroid Function Tests: No results for input(s): TSH, T4TOTAL, FREET4, T3FREE, THYROIDAB in the last 72 hours. Anemia Panel: No results for input(s): VITAMINB12, FOLATE,  FERRITIN, TIBC, IRON, RETICCTPCT in the last 72 hours. Urine analysis:    Component Value Date/Time   COLORURINE COLORLESS (A) 07/05/2021 0424   APPEARANCEUR CLEAR 07/05/2021 0424   LABSPEC 1.008 07/05/2021 0424   PHURINE 5.0 07/05/2021 0424   GLUCOSEU NEGATIVE 07/05/2021 0424   HGBUR NEGATIVE 07/05/2021 0424   BILIRUBINUR NEGATIVE 07/05/2021 0424  BILIRUBINUR Negative 01/13/2017 1234   KETONESUR 5 (A) 07/05/2021 0424   PROTEINUR NEGATIVE 07/05/2021 0424   UROBILINOGEN 0.2 01/13/2017 1234   UROBILINOGEN 1.0 09/11/2015 0135   NITRITE NEGATIVE 07/05/2021 0424   LEUKOCYTESUR NEGATIVE 07/05/2021 0424   Sepsis Labs: @LABRCNTIP (procalcitonin:4,lacticidven:4) ) Recent Results (from the past 240 hour(s))  Resp Panel by RT-PCR (Flu A&B, Covid) Nasopharyngeal Swab     Status: None   Collection Time: 07/04/21  9:16 PM   Specimen: Nasopharyngeal Swab; Nasopharyngeal(NP) swabs in vial transport medium  Result Value Ref Range Status   SARS Coronavirus 2 by RT PCR NEGATIVE NEGATIVE Final    Comment: (NOTE) SARS-CoV-2 target nucleic acids are NOT DETECTED.  The SARS-CoV-2 RNA is generally detectable in upper respiratory specimens during the acute phase of infection. The lowest concentration of SARS-CoV-2 viral copies this assay can detect is 138 copies/mL. A negative result does not preclude SARS-Cov-2 infection and should not be used as the sole basis for treatment or other patient management decisions. A negative result may occur with  improper specimen collection/handling, submission of specimen other than nasopharyngeal swab, presence of viral mutation(s) within the areas targeted by this assay, and inadequate number of viral copies(<138 copies/mL). A negative result must be combined with clinical observations, patient history, and epidemiological information. The expected result is Negative.  Fact Sheet for Patients:  BloggerCourse.com  Fact Sheet for  Healthcare Providers:  SeriousBroker.it  This test is no t yet approved or cleared by the [place] FDA and  has been authorized for detection and/or diagnosis of SARS-CoV-2 by FDA under an Emergency Use Authorization (EUA). This EUA will remain  in effect (meaning this test can be used) for the duration of the COVID-19 declaration under Section 564(b)(1) of the Act, 21 U.S.C.section 360bbb-3(b)(1), unless the authorization is terminated  or revoked sooner.       Influenza A by PCR NEGATIVE NEGATIVE Final   Influenza B by PCR NEGATIVE NEGATIVE Final    Comment: (NOTE) The Xpert Xpress SARS-CoV-2/FLU/RSV plus assay is intended as an aid in the diagnosis of influenza from Nasopharyngeal swab specimens and should not be used as a sole basis for treatment. Nasal washings and aspirates are unacceptable for Xpert Xpress SARS-CoV-2/FLU/RSV testing.  Fact Sheet for Patients: BloggerCourse.com  Fact Sheet for Healthcare Providers: SeriousBroker.it  This test is not yet approved or cleared by the [place] FDA and has been authorized for detection and/or diagnosis of SARS-CoV-2 by FDA under an Emergency Use Authorization (EUA). This EUA will remain in effect (meaning this test can be used) for the duration of the COVID-19 declaration under Section 564(b)(1) of the Act, 21 U.S.C. section 360bbb-3(b)(1), unless the authorization is terminated or revoked.  Performed at [facility], [address] [place], [place] [address]      Radiological Exams on Admission: CT HEAD WO CONTRAST ( )  Result Date: 07/04/2021 CLINICAL DATA:  Altered mental status. EXAM: CT HEAD WITHOUT CONTRAST TECHNIQUE: Contiguous axial images were obtained from the base of the skull through the vertex without intravenous contrast. COMPARISON:  Mar 26, 2021 FINDINGS: Brain: There is mild cerebral atrophy with widening of the  extra-axial spaces and ventricular dilatation. There are areas of decreased attenuation within the white matter tracts of the supratentorial brain, consistent with microvascular disease changes. Small, chronic bilateral basal ganglia lacunar infarcts are seen. Vascular: No hyperdense vessel or unexpected calcification. Skull: Normal. Negative for fracture or focal lesion. Sinuses/Orbits: No acute finding. Other: None. IMPRESSION: 1.  Generalized cerebral atrophy. 2. No acute intracranial abnormality. Electronically Signed   By: Aram Candela [name]   On: 07/04/2021 23:39   CT Angio Chest PE W/Cm &/Or Wo Cm  Result Date: 07/04/2021 CLINICAL DATA:  Concern for pulmonary embolism. EXAM: CT ANGIOGRAPHY CHEST WITH CONTRAST TECHNIQUE: Multidetector CT imaging of the chest was performed using the standard protocol during bolus administration of intravenous contrast. Multiplanar CT image reconstructions and MIPs were obtained to evaluate the vascular anatomy. CONTRAST:  17mL OMNIPAQUE IOHEXOL 350 MG/ML SOLN COMPARISON:  Chest CT dated 09/05/2018. FINDINGS: Cardiovascular: There is mild cardiomegaly. No pericardial effusion. Three-vessel coronary vascular calcification. Is mild atherosclerotic calcification of the thoracic aorta. No aneurysmal dilatation. Evaluation of the pulmonary arteries is limited due to respiratory motion artifact and suboptimal visualization of the peripheral branches. No central pulmonary artery embolus identified. Mediastinum/Nodes: No hilar or mediastinal adenopathy. The esophagus is grossly unremarkable. No mediastinal fluid collection. Lungs/Pleura: Probable trace left pleural effusion. Minimal left lung base atelectasis. No consolidative changes or pneumothorax. The central airways are patent. There is tracheomalacia. Upper Abdomen: Cholecystectomy. There is a 5 cm partially visualized right renal upper pole hypodense/cystic lesion. Musculoskeletal: Degenerative changes of the spine. No  acute osseous pathology. Review of the MIP images confirms the above findings. IMPRESSION: 1. No CT evidence of central pulmonary artery embolus. 2. Aortic Atherosclerosis (ICD10-I70.0). Electronically Signed   By: Elgie Collard [name]   On: 07/04/2021 23:50   DG Chest Port 1 View  Result Date: 07/04/2021 CLINICAL DATA:  Shortness of breath EXAM: PORTABLE CHEST 1 VIEW COMPARISON:  03/26/2021 FINDINGS: Cardiomegaly. No focal opacity, pleural effusion or pneumothorax. Chronic deformity of left humeral head. IMPRESSION: No active disease.  Cardiomegaly Electronically Signed   By: Jasmine Pang [name]   On: 07/04/2021 19:54    EKG: Independently reviewed.  Sinus tachycardia.  Assessment/Plan Principal Problem:   Acute on chronic systolic CHF (congestive heart failure) (HCC) Active Problems:   CKD stage 3 due to type 2 diabetes mellitus (HCC)   Rheumatoid arthritis involving both knees (HCC)   History of CVA (cerebrovascular accident)   Acute CHF (congestive heart failure) (HCC)    Acute on chronic combined systolic and diastolic CHF last EF measured in May 2022 was 60 to 65% presently on Lasix 40 mg IV every 12 we will closely follow intake output metabolic panel and daily weights.  Check Dopplers of the lower extremity to rule out DVT. Acute metabolic encephalopathy essentially resolved at this time.  Patient was mildly febrile at admission.  CT head is unremarkable cultures have been obtained.  No signs of infection at this time.  If symptoms worsen or recurs may consider further imaging. Hypertension uncontrolled we will need to verify patient's home medication for now I kept patient on Lasix and as needed IV hydralazine. History of rheumatoid arthritis on leflunomide and Plaquenil. Chronic anemia follow CBC. Chronic kidney disease stage III creatinine mildly increased from baseline.  Closely monitor metabolic panel. History of CAD status post tenting denies any chest pain. Prior history of  PE.  CT angiogram negative for PE. History of diabetes mellitus type 2 presently not on any diabetic medication.  Last hemoglobin A1c was around 5.8.  We will keep patient on very sensitive sliding scale.  ABGs pending.   Home medication needs to be verified and ordered accordingly.   DVT prophylaxis: Lovenox. Code Status: DNR confirmed with patient's daughter. Family Communication: [name]. Disposition Plan: Back to facility when stable. Consults called: None.  Admission status: Observation.   Eduard Clos MD [facility] Pager [phone].  If 7PM-7AM, please contact night-coverage [email or web address] Password [facility]  07/05/2021, 6:14 AM

## 2021-07-05 NOTE — Progress Notes (Signed)
[facility] PROGRESS NOTE    Progress Note  [name] [name]  ION:[id number] DOB: January 15, 1930 DOA: 07/04/2021 PCP: Pcp, No     Brief Narrative:   [name] [name] is an 87 y.o. female past medical history of chronic combined systolic and diastolic heart failure with an EF of 60%, rheumatoid arthritis, essential hypertension CAD PE currently not on anticoagulation comes into the ED for shortness of breath and acute encephalopathy.  CT angio of the chest was negative for PE CT of the head was unremarkable. BNP of 2000 creatinine mildly elevated from baseline.    Assessment/Plan:   Acute on chronic systolic CHF (congestive heart failure) (HCC): Requiring 3 L oxygen to keep saturations greater than 95%. Started on IV Lasix, I's and O's poorly recorded. She appears fluid overloaded on physical exam. We will go ahead and increase her Lasix.  CKD stage 3 due to type 2 diabetes mellitus (HCC) With baseline creatinine of 1 on admission 1.2 we will see if is improved with diuresis.  Acute metabolic encephalopathy: Patient is currently afebrile, CT is of the head showed no acute findings. She does not appear focal on physical exam. Her blood pressure is significantly elevated which could be contributing to her acute encephalopathy. Check MRI brain.  Uncontrolled hypertension: She was restarted on her home medications. Her blood pressure continues to be significantly elevated. At home she is on Norvasc  Rheumatoid arthritis involving both knees (HCC) Continue current  home regimen.  Normocytic anemia: Likely due to chronic renal disease.  Continue to monitor seems stable from previous admission.  History of CAD, Denies any chest pain.  History of diabetes mellitus type 2: A1c of 5.8 continue sliding scale insulin   DVT prophylaxis: lovenox Family Communication:none Status is: Observation  The patient will require care spanning > 2 midnights and should be moved to  inpatient because: Hemodynamically unstable  Dispo: The patient is from: Home              Anticipated d/c is to: SNF              Patient currently is not medically stable to d/c.   Difficult to place patient No   Code Status:     Code Status Orders  (From admission, onward)           Start     Ordered   07/05/21 0630  Do not attempt resuscitation (DNR)  Continuous       Question Answer Comment  In the event of cardiac or respiratory ARREST Do not call a "code blue"   In the event of cardiac or respiratory ARREST Do not perform Intubation, CPR, defibrillation or ACLS   In the event of cardiac or respiratory ARREST Use medication by any route, position, wound care, and other measures to relive pain and suffering. May use oxygen, suction and manual treatment of airway obstruction as needed for comfort.      07/05/21 0629           Code Status History     Date Active Date Inactive Code Status Order ID Comments User Context   07/05/2021 0614 07/05/2021 0629 Full Code [id number]  Eduard Clos, MD ED   03/26/2021 1646 03/28/2021 2042 DNR [id number]  Dolan Amen, MD ED   08/27/2020 0746 03/26/2021 1138 Full Code [id number]  Kermit Balo, DO Outpatient   12/13/2019 0331 12/14/2019 0352 Full Code [id number]  Eduard Clos, MD ED   03/29/2019 [id number] 04/03/2019 2202  Full Code [id number]  Rolly Salter, MD ED   01/09/2019 1701 01/12/2019 1824 Full Code [id number]  Jonah Blue, MD Inpatient   12/13/2018 1523 12/20/2018 2209 Full Code [id number]  Alba Cory, MD Inpatient   11/15/2018 0343 11/17/2018 2355 Full Code [id number]  Eduard Clos, MD ED   10/21/2018 2111 10/25/2018 1458 Full Code [id number]  Kathrynn Running, MD Inpatient   09/05/2018 1932 09/07/2018 2115 Full Code [id number]  John Giovanni, MD ED   07/24/2018 0208 07/27/2018 1929 Full Code [id number]  Briscoe Deutscher, MD ED   06/23/2018 0451 06/27/2018 2316 Full Code [id number]  Eduard Clos, MD Inpatient    05/28/2018 2345 06/04/2018 2356 Full Code [id number]  Hugelmeyer, [place], DO Inpatient   02/02/2018 0606 02/04/2018 1957 DNR [id number]  [name], MD ED   11/26/2017 2055 11/29/2017 1715 DNR [id number]  Zigmund Daniel., MD Inpatient   05/25/2017 1811 05/27/2017 1956 DNR [id number]  Leatha Gilding, MD Inpatient   11/29/2016 1522 12/01/2016 1509 Full Code [id number]  Haydee Salter, MD ED   09/11/2015 0454 09/12/2015 1432 Full Code [id number]  Hillary Bow, DO ED   06/19/2015 2338 06/21/2015 1941 Full Code [id number]  Hillary Bow, DO ED   01/25/2014 2245 06/19/2015 2338 Full Code [id number]  Margit Hanks, MD Outpatient   01/17/2014 1305 01/22/2014 2142 Full Code [id number]  Drema Dallas, MD ED         IV Access:   Peripheral IV   Procedures and diagnostic studies:   CT HEAD WO CONTRAST ( )  Result Date: 07/04/2021 CLINICAL DATA:  Altered mental status. EXAM: CT HEAD WITHOUT CONTRAST TECHNIQUE: Contiguous axial images were obtained from the base of the skull through the vertex without intravenous contrast. COMPARISON:  Mar 26, 2021 FINDINGS: Brain: There is mild cerebral atrophy with widening of the extra-axial spaces and ventricular dilatation. There are areas of decreased attenuation within the white matter tracts of the supratentorial brain, consistent with microvascular disease changes. Small, chronic bilateral basal ganglia lacunar infarcts are seen. Vascular: No hyperdense vessel or unexpected calcification. Skull: Normal. Negative for fracture or focal lesion. Sinuses/Orbits: No acute finding. Other: None. IMPRESSION: 1. Generalized cerebral atrophy. 2. No acute intracranial abnormality. Electronically Signed   By: Aram Candela [name]   On: 07/04/2021 23:39   CT Angio Chest PE W/Cm &/Or Wo Cm  Result Date: 07/04/2021 CLINICAL DATA:  Concern for pulmonary embolism. EXAM: CT ANGIOGRAPHY CHEST WITH CONTRAST TECHNIQUE: Multidetector CT imaging of the chest was performed  using the standard protocol during bolus administration of intravenous contrast. Multiplanar CT image reconstructions and MIPs were obtained to evaluate the vascular anatomy. CONTRAST:  3mL OMNIPAQUE IOHEXOL 350 MG/ML SOLN COMPARISON:  Chest CT dated 09/05/2018. FINDINGS: Cardiovascular: There is mild cardiomegaly. No pericardial effusion. Three-vessel coronary vascular calcification. Is mild atherosclerotic calcification of the thoracic aorta. No aneurysmal dilatation. Evaluation of the pulmonary arteries is limited due to respiratory motion artifact and suboptimal visualization of the peripheral branches. No central pulmonary artery embolus identified. Mediastinum/Nodes: No hilar or mediastinal adenopathy. The esophagus is grossly unremarkable. No mediastinal fluid collection. Lungs/Pleura: Probable trace left pleural effusion. Minimal left lung base atelectasis. No consolidative changes or pneumothorax. The central airways are patent. There is tracheomalacia. Upper Abdomen: Cholecystectomy. There is a 5 cm partially visualized right renal upper pole hypodense/cystic lesion. Musculoskeletal: Degenerative changes of the spine. No acute osseous pathology. Review of the MIP images confirms the above findings. IMPRESSION: 1. No  CT evidence of central pulmonary artery embolus. 2. Aortic Atherosclerosis (ICD10-I70.0). Electronically Signed   By: Elgie Collard [name]   On: 07/04/2021 23:50   DG Chest Port 1 View  Result Date: 07/04/2021 CLINICAL DATA:  Shortness of breath EXAM: PORTABLE CHEST 1 VIEW COMPARISON:  03/26/2021 FINDINGS: Cardiomegaly. No focal opacity, pleural effusion or pneumothorax. Chronic deformity of left humeral head. IMPRESSION: No active disease.  Cardiomegaly Electronically Signed   By: Jasmine Pang [name]   On: 07/04/2021 19:54     Medical Consultants:   None.   Subjective:    Norleen BETTA BALLA confused  Objective:    Vitals:   07/05/21 0459 07/05/21 0500 07/05/21 0515 07/05/21  0530  BP: (!) 179/96 (!) 175/96 (!) 182/92 (!) 170/93  Pulse: 85 85 90 82  Resp: 13 13 14 13   Temp:      TempSrc:      SpO2: 100% 100% 99% 100%  Weight:       SpO2: 100 % O2 Flow Rate (L/min): 3 L/min   Intake/Output Summary (Last 24 hours) at 07/05/2021 0655 Last data filed at 07/05/2021 0640 Gross per 24 hour  Intake 1000.09 ml  Output --  Net 1000.09 ml   Filed Weights   07/04/21 2227  Weight: 102.2 kg    Exam: General exam: In no acute distress. Respiratory system: Good air movement and no wheezing but diffuse crackles bilaterally Cardiovascular system: S1 & S2 heard, RRR.  Positive JVD Gastrointestinal system: Abdomen is nondistended, soft and nontender.  Extremities: No pedal edema. Skin: No rashes, lesions or ulcers    Data Reviewed:    Labs: Basic Metabolic Panel: Recent Labs  Lab 07/04/21 1950  NA 137  K 3.6  CL 104  CO2 25  GLUCOSE 96  BUN 33*  CREATININE 1.29*  CALCIUM 8.7*   GFR Estimated Creatinine Clearance: 34.4 mL/min (A) (by C-G formula based on SCr of 1.29 mg/dL (H)). Liver Function Tests: Recent Labs  Lab 07/04/21 1950  AST 13*  ALT 13  ALKPHOS 58  BILITOT 0.7  PROT 6.2*  ALBUMIN 3.3*   No results for input(s): LIPASE, AMYLASE in the last 168 hours. No results for input(s): AMMONIA in the last 168 hours. Coagulation profile Recent Labs  Lab 07/05/21 0005  INR 1.2   COVID-19 Labs  No results for input(s): DDIMER, FERRITIN, LDH, CRP in the last 72 hours.  Lab Results  Component Value Date   SARSCOV2NAA NEGATIVE 07/04/2021   SARSCOV2NAA NEGATIVE 03/26/2021   SARSCOV2NAA NEGATIVE 12/13/2019   SARSCOV2NAA POSITIVE (A) 10/26/2019    CBC: Recent Labs  Lab 07/04/21 1950  WBC 9.0  NEUTROABS 6.7  HGB 10.6*  HCT 33.3*  MCV 98.5  PLT 209   Cardiac Enzymes: No results for input(s): CKTOTAL, CKMB, CKMBINDEX, TROPONINI in the last 168 hours. BNP (last 3 results) No results for input(s): PROBNP in the last 8760  hours. CBG: No results for input(s): GLUCAP in the last 168 hours. D-Dimer: No results for input(s): DDIMER in the last 72 hours. Hgb A1c: No results for input(s): HGBA1C in the last 72 hours. Lipid Profile: No results for input(s): CHOL, HDL, LDLCALC, TRIG, CHOLHDL, LDLDIRECT in the last 72 hours. Thyroid function studies: No results for input(s): TSH, T4TOTAL, T3FREE, THYROIDAB in the last 72 hours.  Invalid input(s): FREET3 Anemia work up: No results for input(s): VITAMINB12, FOLATE, FERRITIN, TIBC, IRON, RETICCTPCT in the last 72 hours. Sepsis Labs: Recent Labs  Lab 07/04/21 1950  WBC 9.0  LATICACIDVEN 1.1   Microbiology Recent Results (from the past 240 hour(s))  Resp Panel by RT-PCR (Flu A&B, Covid) Nasopharyngeal Swab     Status: None   Collection Time: 07/04/21  9:16 PM   Specimen: Nasopharyngeal Swab; Nasopharyngeal(NP) swabs in vial transport medium  Result Value Ref Range Status   SARS Coronavirus 2 by RT PCR NEGATIVE NEGATIVE Final    Comment: (NOTE) SARS-CoV-2 target nucleic acids are NOT DETECTED.  The SARS-CoV-2 RNA is generally detectable in upper respiratory specimens during the acute phase of infection. The lowest concentration of SARS-CoV-2 viral copies this assay can detect is 138 copies/mL. A negative result does not preclude SARS-Cov-2 infection and should not be used as the sole basis for treatment or other patient management decisions. A negative result may occur with  improper specimen collection/handling, submission of specimen other than nasopharyngeal swab, presence of viral mutation(s) within the areas targeted by this assay, and inadequate number of viral copies(<138 copies/mL). A negative result must be combined with clinical observations, patient history, and epidemiological information. The expected result is Negative.  Fact Sheet for Patients:  BloggerCourse.com  Fact Sheet for Healthcare Providers:   SeriousBroker.it  This test is no t yet approved or cleared by the [place] FDA and  has been authorized for detection and/or diagnosis of SARS-CoV-2 by FDA under an Emergency Use Authorization (EUA). This EUA will remain  in effect (meaning this test can be used) for the duration of the COVID-19 declaration under Section 564(b)(1) of the Act, 21 U.S.C.section 360bbb-3(b)(1), unless the authorization is terminated  or revoked sooner.       Influenza A by PCR NEGATIVE NEGATIVE Final   Influenza B by PCR NEGATIVE NEGATIVE Final    Comment: (NOTE) The Xpert Xpress SARS-CoV-2/FLU/RSV plus assay is intended as an aid in the diagnosis of influenza from Nasopharyngeal swab specimens and should not be used as a sole basis for treatment. Nasal washings and aspirates are unacceptable for Xpert Xpress SARS-CoV-2/FLU/RSV testing.  Fact Sheet for Patients: BloggerCourse.com  Fact Sheet for Healthcare Providers: SeriousBroker.it  This test is not yet approved or cleared by the [place] FDA and has been authorized for detection and/or diagnosis of SARS-CoV-2 by FDA under an Emergency Use Authorization (EUA). This EUA will remain in effect (meaning this test can be used) for the duration of the COVID-19 declaration under Section 564(b)(1) of the Act, 21 U.S.C. section 360bbb-3(b)(1), unless the authorization is terminated or revoked.  Performed at [facility], [address], [place], [place] [address]      Medications:    acidophilus  1 capsule Oral Daily   enoxaparin (LOVENOX) injection  40 mg Subcutaneous Daily   furosemide  40 mg Intravenous BID   hydroxychloroquine  200 mg Oral BID   insulin aspart  0-6 Units Subcutaneous TID WC   leflunomide  20 mg Oral QPM   senna-docusate  2 tablet Oral Q breakfast   And   senna-docusate  1 tablet Oral BID   Continuous Infusions:    LOS: 0 days    Marinda Elk  [facility]  07/05/2021, 6:55 AM

## 2021-07-05 NOTE — Sepsis Progress Note (Signed)
Code sepsis ordered at 2135.  Antibiotics ordered at that time.  First lactic drawn at 1950.  Antibiotics delayed.

## 2021-07-06 ENCOUNTER — Inpatient Hospital Stay (HOSPITAL_COMMUNITY): Payer: Medicare Other

## 2021-07-06 LAB — PROCALCITONIN: Procalcitonin: 0.21 ng/mL

## 2021-07-06 LAB — BASIC METABOLIC PANEL
Anion gap: 13 (ref 5–15)
BUN: 32 mg/dL — ABNORMAL HIGH (ref 8–23)
CO2: 25 mmol/L (ref 22–32)
Calcium: 9 mg/dL (ref 8.9–10.3)
Chloride: 101 mmol/L (ref 98–111)
Creatinine, Ser: 1.1 mg/dL — ABNORMAL HIGH (ref 0.44–1.00)
GFR, Estimated: 48 mL/min — ABNORMAL LOW (ref >60)
Glucose, Bld: 143 mg/dL — ABNORMAL HIGH (ref 70–99)
Potassium: 2.9 mmol/L — ABNORMAL LOW (ref 3.5–5.1)
Sodium: 139 mmol/L (ref 135–145)

## 2021-07-06 LAB — RESPIRATORY PANEL BY PCR

## 2021-07-06 LAB — CBC
HCT: 29.8 % — ABNORMAL LOW (ref 36.0–46.0)
Hemoglobin: 9.8 g/dL — ABNORMAL LOW (ref 12.0–15.0)
MCH: 31 pg (ref 26.0–34.0)
MCHC: 32.9 g/dL (ref 30.0–36.0)
MCV: 94.3 fL (ref 80.0–100.0)
Platelets: 225 10*3/uL (ref 150–400)
RBC: 3.16 MIL/uL — ABNORMAL LOW (ref 3.87–5.11)
RDW: 18.3 % — ABNORMAL HIGH (ref 11.5–15.5)
WBC: 14 10*3/uL — ABNORMAL HIGH (ref 4.0–10.5)
nRBC: 0.3 % — ABNORMAL HIGH (ref 0.0–0.2)

## 2021-07-06 LAB — GLUCOSE, CAPILLARY
Glucose-Capillary: 138 mg/dL — ABNORMAL HIGH (ref 70–99)
Glucose-Capillary: 120 mg/dL — ABNORMAL HIGH (ref 70–99)
Glucose-Capillary: 97 mg/dL (ref 70–99)
Glucose-Capillary: 110 mg/dL — ABNORMAL HIGH (ref 70–99)

## 2021-07-06 MED ORDER — FUROSEMIDE 10 MG/ML IJ SOLN: 40.0000 mg | Freq: Two times a day (BID) | INTRAMUSCULAR | Status: DC

## 2021-07-06 MED ORDER — POTASSIUM CHLORIDE CRYS ER 20 MEQ PO TBCR
40.0000 meq | EXTENDED_RELEASE_TABLET | Freq: Once | ORAL | Status: AC
  Administered 2021-07-06: 40 meq via ORAL
  Filled 2021-07-06: qty 2

## 2021-07-06 MED ORDER — FUROSEMIDE 10 MG/ML IJ SOLN: 80.0000 mg | Freq: Two times a day (BID) | INTRAMUSCULAR | Status: DC

## 2021-07-06 MED ORDER — POLYVINYL ALCOHOL 1.4 % OP SOLN
2.0000 [drp] | OPHTHALMIC | Status: DC | PRN
  Administered 2021-07-06 – 2021-07-15 (×4): 2 [drp] via OPHTHALMIC
  Filled 2021-07-06: qty 15

## 2021-07-06 MED ORDER — IOHEXOL 350 MG/ML SOLN
79.0000 mL | Freq: Once | INTRAVENOUS | Status: AC | PRN
  Administered 2021-07-06: 79 mL via INTRAVENOUS

## 2021-07-06 MED ORDER — IOHEXOL 9 MG/ML PO SOLN
500.0000 mL | ORAL | Status: AC
  Administered 2021-07-06 (×2): 500 mL via ORAL

## 2021-07-06 MED ORDER — POTASSIUM CHLORIDE CRYS ER 20 MEQ PO TBCR
40.0000 meq | EXTENDED_RELEASE_TABLET | Freq: Two times a day (BID) | ORAL | Status: AC
  Administered 2021-07-06 (×2): 40 meq via ORAL
  Filled 2021-07-06 (×2): qty 2

## 2021-07-06 NOTE — Progress Notes (Signed)
[facility] PROGRESS NOTE    Progress Note  [name] [name]  YQM:[id number] DOB: May 20, 1930 DOA: 07/04/2021 PCP: Pcp, No     Brief Narrative:   [name] [name] is an 87 y.o. female past medical history of chronic combined systolic and diastolic heart failure with an EF of 60%, rheumatoid arthritis, essential hypertension CAD PE currently not on anticoagulation comes into the ED for shortness of breath and acute encephalopathy.  CT angio of the chest was negative for PE CT of the head was unremarkable. BNP of 2000 creatinine mildly elevated from baseline.    Assessment/Plan:   Acute on chronic systolic CHF (congestive heart failure) (HCC): Requiring 3 L oxygen to keep saturations greater than 95%. Hold Lasix for today, there has been a significant improvement in her weight she now weighs 94 kg. On admission she was 97 kg. Continues regards and O's and daily weights. Potassium dismal morning is 2.9 she is hypokalemic will replete orally. Recheck tomorrow morning.  Fever and leukocytosis: On admission she had a temperature of 101.2 her white blood cell count is rising now is 14. Continue monitor fever curve check a CBC dif tomorrow morning. Chest x-ray does not appear to show infiltrates, UA appears to be clean. We will get a CT scan of the abdomen and pelvis. Check a procalcitonin.  CKD stage 3 due to type 2 diabetes mellitus (HCC) With baseline creatinine of 1 on admission 1.2 we will see if is improved with diuresis.  Acute metabolic encephalopathy: Patient is currently afebrile, CT is of the head showed no acute findings. I of the brain showed no acute findings. She does not appear focal on physical exam.  Uncontrolled hypertension: She was restarted on her home medications. Her blood pressure significantly improved.  We will continue current regimen.  Rheumatoid arthritis involving both knees (HCC) Continue current  home regimen.  Normocytic anemia: Likely  due to chronic renal disease.  Continue to monitor seems stable from previous admission.  History of CAD, Denies any chest pain.  History of diabetes mellitus type 2: A1c of 5.8 continue sliding scale insulin   DVT prophylaxis: lovenox Family Communication:none Status is: Observation  The patient will require care spanning > 2 midnights and should be moved to inpatient because: Hemodynamically unstable  Dispo: The patient is from: Home              Anticipated d/c is to: SNF              Patient currently is not medically stable to d/c.   Difficult to place patient No   Code Status:     Code Status Orders  (From admission, onward)           Start     Ordered   07/05/21 0630  Do not attempt resuscitation (DNR)  Continuous       Question Answer Comment  In the event of cardiac or respiratory ARREST Do not call a "code blue"   In the event of cardiac or respiratory ARREST Do not perform Intubation, CPR, defibrillation or ACLS   In the event of cardiac or respiratory ARREST Use medication by any route, position, wound care, and other measures to relive pain and suffering. May use oxygen, suction and manual treatment of airway obstruction as needed for comfort.      07/05/21 5284           Code Status History     Date Active Date Inactive Code Status Order  ID Comments User Context   07/05/2021 0614 07/05/2021 0629 Full Code [id number]  Eduard Clos, MD ED   03/26/2021 1646 03/28/2021 2042 DNR [id number]  Dolan Amen, MD ED   08/27/2020 0746 03/26/2021 1138 Full Code [id number]  Kermit Balo, DO Outpatient   12/13/2019 0331 12/14/2019 0352 Full Code [id number]  Eduard Clos, MD ED   03/29/2019 1717 04/03/2019 2202 Full Code [id number]  Rolly Salter, MD ED   01/09/2019 1701 01/12/2019 1824 Full Code [id number]  Jonah Blue, MD Inpatient   12/13/2018 1523 12/20/2018 2209 Full Code [id number]  Alba Cory, MD Inpatient   11/15/2018 0343 11/17/2018 2355 Full Code  [id number]  Eduard Clos, MD ED   10/21/2018 2111 10/25/2018 1458 Full Code [id number]  Kathrynn Running, MD Inpatient   09/05/2018 1932 09/07/2018 2115 Full Code [id number]  John Giovanni, MD ED   07/24/2018 0208 07/27/2018 1929 Full Code [id number]  Briscoe Deutscher, MD ED   06/23/2018 0451 06/27/2018 2316 Full Code [id number]  Eduard Clos, MD Inpatient   05/28/2018 2345 06/04/2018 2356 Full Code [id number]  Hugelmeyer, Napoleon, DO Inpatient   02/02/2018 0606 02/04/2018 1957 DNR [id number]  [name], MD ED   11/26/2017 2055 11/29/2017 1715 DNR [id number]  Zigmund Daniel., MD Inpatient   05/25/2017 1811 05/27/2017 1956 DNR [id number]  Leatha Gilding, MD Inpatient   11/29/2016 1522 12/01/2016 1509 Full Code [id number]  Haydee Salter, MD ED   09/11/2015 0454 09/12/2015 1432 Full Code [id number]  Hillary Bow, DO ED   06/19/2015 2338 06/21/2015 1941 Full Code [id number]  Hillary Bow, DO ED   01/25/2014 2245 06/19/2015 2338 Full Code [id number]  Margit Hanks, MD Outpatient   01/17/2014 1305 01/22/2014 2142 Full Code [id number]  Drema Dallas, MD ED         IV Access:   Peripheral IV   Procedures and diagnostic studies:   CT HEAD WO CONTRAST ( )  Result Date: 07/04/2021 CLINICAL DATA:  Altered mental status. EXAM: CT HEAD WITHOUT CONTRAST TECHNIQUE: Contiguous axial images were obtained from the base of the skull through the vertex without intravenous contrast. COMPARISON:  Mar 26, 2021 FINDINGS: Brain: There is mild cerebral atrophy with widening of the extra-axial spaces and ventricular dilatation. There are areas of decreased attenuation within the white matter tracts of the supratentorial brain, consistent with microvascular disease changes. Small, chronic bilateral basal ganglia lacunar infarcts are seen. Vascular: No hyperdense vessel or unexpected calcification. Skull: Normal. Negative for fracture or focal lesion. Sinuses/Orbits: No acute finding. Other:  None. IMPRESSION: 1. Generalized cerebral atrophy. 2. No acute intracranial abnormality. Electronically Signed   By: Aram Candela [name]   On: 07/04/2021 23:39   CT Angio Chest PE W/Cm &/Or Wo Cm  Result Date: 07/04/2021 CLINICAL DATA:  Concern for pulmonary embolism. EXAM: CT ANGIOGRAPHY CHEST WITH CONTRAST TECHNIQUE: Multidetector CT imaging of the chest was performed using the standard protocol during bolus administration of intravenous contrast. Multiplanar CT image reconstructions and MIPs were obtained to evaluate the vascular anatomy. CONTRAST:  29mL OMNIPAQUE IOHEXOL 350 MG/ML SOLN COMPARISON:  Chest CT dated 09/05/2018. FINDINGS: Cardiovascular: There is mild cardiomegaly. No pericardial effusion. Three-vessel coronary vascular calcification. Is mild atherosclerotic calcification of the thoracic aorta. No aneurysmal dilatation. Evaluation of the pulmonary arteries is limited due to respiratory motion artifact and suboptimal visualization of the peripheral branches. No central pulmonary artery embolus identified. Mediastinum/Nodes: No hilar or mediastinal adenopathy. The esophagus is  grossly unremarkable. No mediastinal fluid collection. Lungs/Pleura: Probable trace left pleural effusion. Minimal left lung base atelectasis. No consolidative changes or pneumothorax. The central airways are patent. There is tracheomalacia. Upper Abdomen: Cholecystectomy. There is a 5 cm partially visualized right renal upper pole hypodense/cystic lesion. Musculoskeletal: Degenerative changes of the spine. No acute osseous pathology. Review of the MIP images confirms the above findings. IMPRESSION: 1. No CT evidence of central pulmonary artery embolus. 2. Aortic Atherosclerosis (ICD10-I70.0). Electronically Signed   By: Elgie Collard [name]   On: 07/04/2021 23:50   MR BRAIN WO CONTRAST  Result Date: 07/05/2021 CLINICAL DATA:  87 year old female with altered mental status.  TIA. EXAM: MRI HEAD WITHOUT CONTRAST  TECHNIQUE: Multiplanar, multiecho pulse sequences of the brain and surrounding structures were obtained without intravenous contrast. COMPARISON:  Head CT 07/04/2021.  Brain MRI 03/26/2021. FINDINGS: Brain: No restricted diffusion to suggest acute infarction. No midline shift, mass effect, evidence of mass lesion, ventriculomegaly, extra-axial collection or acute intracranial hemorrhage. Cervicomedullary junction and pituitary are within normal limits. Patchy and confluent bilateral cerebral white matter T2 and FLAIR hyperintensity is stable since May. No cortical encephalomalacia or chronic cerebral blood products identified. Deep gray matter nuclei, brainstem and cerebellum remain within normal limits. Vascular: Major intracranial vascular flow voids are stable. Skull and upper cervical spine: Negative visible cervical spine. Visualized bone marrow signal is within normal limits. Sinuses/Orbits: Orbits appear stable and negative. Unchanged sphenoid sinus mucosal thickening. Other: Trace bilateral mastoid fluid is stable. Negative visible nasopharynx. A small round 12 mm T2 hyperintense nodule in the posterior superficial left parotid gland has been present since at least 2015 compatible with benign etiology. Visualized scalp soft tissues are within normal limits. IMPRESSION: 1. No acute intracranial abnormality. 2. Stable moderate cerebral white matter signal changes, most likely due to chronic small vessel disease. Electronically Signed   By: Odessa Fleming [name]   On: 07/05/2021 10:31   DG Chest Port 1 View  Result Date: 07/04/2021 CLINICAL DATA:  Shortness of breath EXAM: PORTABLE CHEST 1 VIEW COMPARISON:  03/26/2021 FINDINGS: Cardiomegaly. No focal opacity, pleural effusion or pneumothorax. Chronic deformity of left humeral head. IMPRESSION: No active disease.  Cardiomegaly Electronically Signed   By: Jasmine Pang [name]   On: 07/04/2021 19:54   VAS [place] LOWER EXTREMITY VENOUS (DVT)  Result Date: 07/05/2021   Lower Venous DVT Study Patient Name:  [name] [name]  Date of Exam:   07/05/2021 Medical Rec #: [id number]         Accession #:    [id number] Date of Birth: August 19, 1930         Patient Gender: F Patient Age:   19 years Exam Location:  [facility] Procedure:      VAS [place] LOWER EXTREMITY VENOUS (DVT) Referring Phys: Midge Minium --------------------------------------------------------------------------------  Indications: SOB, and Swelling.  Risk Factors: History of PE and DVT. Limitations: Body habitus, poor ultrasound/tissue interface and patient intolerance to compression. Comparison Study: 07-25-2018 Bilateral lower extremity venous study was positive                   for DVT involving the right peroneal veins. Performing Technologist: Jean Rosenthal RDMS, RVT  Examination Guidelines: A complete evaluation includes B-mode imaging, spectral Doppler, color Doppler, and power Doppler as needed of all accessible portions of each vessel. Bilateral testing is considered an integral part of a complete examination. Limited examinations for reoccurring indications may be performed as noted. The reflux portion of the exam is  performed with the patient in reverse Trendelenburg.  +---------+---------------+---------+-----------+----------+--------------+ RIGHT    CompressibilityPhasicitySpontaneityPropertiesThrombus Aging +---------+---------------+---------+-----------+----------+--------------+ CFV      Full           Yes      Yes                                 +---------+---------------+---------+-----------+----------+--------------+ SFJ      Full                                                        +---------+---------------+---------+-----------+----------+--------------+ FV Prox  Full                                                        +---------+---------------+---------+-----------+----------+--------------+ FV Mid                  Yes      Yes                                  +---------+---------------+---------+-----------+----------+--------------+ FV Distal               Yes      Yes                                 +---------+---------------+---------+-----------+----------+--------------+ PFV      Full                                                        +---------+---------------+---------+-----------+----------+--------------+ POP      Full           Yes      Yes                                 +---------+---------------+---------+-----------+----------+--------------+ PTV      Full                                                        +---------+---------------+---------+-----------+----------+--------------+ PERO     Full                                                        +---------+---------------+---------+-----------+----------+--------------+   +---------+---------------+---------+-----------+----------+-------------------+ LEFT     CompressibilityPhasicitySpontaneityPropertiesThrombus Aging      +---------+---------------+---------+-----------+----------+-------------------+ CFV      Full           Yes      Yes                                      +---------+---------------+---------+-----------+----------+-------------------+  SFJ      Full                                                             +---------+---------------+---------+-----------+----------+-------------------+ FV Prox  Partial        Yes      Yes                  Age Indeterminate   +---------+---------------+---------+-----------+----------+-------------------+ FV Mid                  No       No                   Chronic             +---------+---------------+---------+-----------+----------+-------------------+ FV Distal               No       No                   Chronic             +---------+---------------+---------+-----------+----------+-------------------+ PFV      Full                                                              +---------+---------------+---------+-----------+----------+-------------------+ POP      Full           Yes      Yes                                      +---------+---------------+---------+-----------+----------+-------------------+ PTV      Full                                                             +---------+---------------+---------+-----------+----------+-------------------+ PERO                                                  Not well visualized +---------+---------------+---------+-----------+----------+-------------------+     Summary: RIGHT: - There is no evidence of deep vein thrombosis in the lower extremity. However, portions of this examination were limited- see technologist comments above.  - No cystic structure found in the popliteal fossa.  LEFT: - Findings consistent with age indeterminate deep vein thrombosis involving the proximal left femoral vein. - Findings consistent with chronic deep vein thrombosis involving the mid to distal left femoral vein.  *See table(s) above for measurements and observations. Electronically signed by Sherald Hess MD on 07/05/2021 at 1:28:45 PM.    Final      Medical Consultants:   None.   Subjective:    [name] [name] more awake today when she wanted to go to  skilled nursing facility has not had a bowel movement.  Objective:    Vitals:   07/05/21 2100 07/06/21 0209 07/06/21 0213 07/06/21 0748  BP:  136/80  124/79  Pulse:    100  Resp:    20  Temp: 98 F (36.7 C)   97.6 F (36.4 C)  TempSrc: Oral   Oral  SpO2:    100%  Weight:   94 kg   Height:       SpO2: 100 % O2 Flow Rate (L/min): 1 L/min   Intake/Output Summary (Last 24 hours) at 07/06/2021 0932 Last data filed at 07/05/2021 0956 Gross per 24 hour  Intake --  Output 1000 ml  Net -1000 ml    Filed Weights   07/05/21 1339 07/05/21 1428 07/06/21 0213  Weight: 102.1 kg 97 kg 94 kg    Exam: General exam: In no  acute distress. Respiratory system: Good air movement and clear to auscultation. Cardiovascular system: S1 & S2 heard, RRR. No JVD. Gastrointestinal system: Abdomen is nondistended, soft and nontender.  Extremities: No pedal edema. Skin: No rashes, lesions or ulcers  Data Reviewed:    Labs: Basic Metabolic Panel: Recent Labs  Lab 07/04/21 1950 07/05/21 0656 07/06/21 0444  NA 137  --  139  K 3.6  --  2.9*  CL 104  --  101  CO2 25  --  25  GLUCOSE 96  --  143*  BUN 33*  --  32*  CREATININE 1.29* 1.18* 1.10*  CALCIUM 8.7*  --  9.0    GFR Estimated Creatinine Clearance: 38.5 mL/min (A) (by C-G formula based on SCr of 1.1 mg/dL (H)). Liver Function Tests: Recent Labs  Lab 07/04/21 1950  AST 13*  ALT 13  ALKPHOS 58  BILITOT 0.7  PROT 6.2*  ALBUMIN 3.3*    No results for input(s): LIPASE, AMYLASE in the last 168 hours. No results for input(s): AMMONIA in the last 168 hours. Coagulation profile Recent Labs  Lab 07/05/21 0005  INR 1.2    COVID-19 Labs  No results for input(s): DDIMER, FERRITIN, LDH, CRP in the last 72 hours.  Lab Results  Component Value Date   SARSCOV2NAA NEGATIVE 07/04/2021   SARSCOV2NAA NEGATIVE 03/26/2021   SARSCOV2NAA NEGATIVE 12/13/2019   SARSCOV2NAA POSITIVE (A) 10/26/2019    CBC: Recent Labs  Lab 07/04/21 1950 07/05/21 0656 07/06/21 0444  WBC 9.0 11.7* 14.0*  NEUTROABS 6.7  --   --   HGB 10.6* 10.5* 9.8*  HCT 33.3* 34.4* 29.8*  MCV 98.5 99.7 94.3  PLT 209 210 225    Cardiac Enzymes: No results for input(s): CKTOTAL, CKMB, CKMBINDEX, TROPONINI in the last 168 hours. BNP (last 3 results) No results for input(s): PROBNP in the last 8760 hours. CBG: Recent Labs  Lab 07/05/21 0739 07/05/21 1322 07/05/21 1648 07/06/21 0738  GLUCAP 141* 181* 136* 138*   D-Dimer: No results for input(s): DDIMER in the last 72 hours. Hgb A1c: No results for input(s): HGBA1C in the last 72 hours. Lipid Profile: No results for  input(s): CHOL, HDL, LDLCALC, TRIG, CHOLHDL, LDLDIRECT in the last 72 hours. Thyroid function studies: No results for input(s): TSH, T4TOTAL, T3FREE, THYROIDAB in the last 72 hours.  Invalid input(s): FREET3 Anemia work up: No results for input(s): VITAMINB12, FOLATE, FERRITIN, TIBC, IRON, RETICCTPCT in the last 72 hours. Sepsis Labs: Recent Labs  Lab 07/04/21 1950 07/05/21 0656 07/06/21 0444  WBC 9.0 11.7* 14.0*  LATICACIDVEN 1.1  --   --  Microbiology Recent Results (from the past 240 hour(s))  Blood Culture (routine x 2)     Status: None (Preliminary result)   Collection Time: 07/04/21  7:50 PM   Specimen: BLOOD  Result Value Ref Range Status   Specimen Description BLOOD LEFT ARM  Final   Special Requests   Final    BOTTLES DRAWN AEROBIC AND ANAEROBIC Blood Culture results may not be optimal due to an excessive volume of blood received in culture bottles   Culture   Final    NO GROWTH < 24 HOURS Performed at [facility], [address] [place], [place] [address]    Report Status PENDING  Incomplete  Resp Panel by RT-PCR (Flu A&B, Covid) Nasopharyngeal Swab     Status: None   Collection Time: 07/04/21  9:16 PM   Specimen: Nasopharyngeal Swab; Nasopharyngeal(NP) swabs in vial transport medium  Result Value Ref Range Status   SARS Coronavirus 2 by RT PCR NEGATIVE NEGATIVE Final    Comment: (NOTE) SARS-CoV-2 target nucleic acids are NOT DETECTED.  The SARS-CoV-2 RNA is generally detectable in upper respiratory specimens during the acute phase of infection. The lowest concentration of SARS-CoV-2 viral copies this assay can detect is 138 copies/mL. A negative result does not preclude SARS-Cov-2 infection and should not be used as the sole basis for treatment or other patient management decisions. A negative result may occur with  improper specimen collection/handling, submission of specimen other than nasopharyngeal swab, presence of viral mutation(s) within  the areas targeted by this assay, and inadequate number of viral copies(<138 copies/mL). A negative result must be combined with clinical observations, patient history, and epidemiological information. The expected result is Negative.  Fact Sheet for Patients:  BloggerCourse.com  Fact Sheet for Healthcare Providers:  SeriousBroker.it  This test is no t yet approved or cleared by the [place] FDA and  has been authorized for detection and/or diagnosis of SARS-CoV-2 by FDA under an Emergency Use Authorization (EUA). This EUA will remain  in effect (meaning this test can be used) for the duration of the COVID-19 declaration under Section 564(b)(1) of the Act, 21 U.S.C.section 360bbb-3(b)(1), unless the authorization is terminated  or revoked sooner.       Influenza A by PCR NEGATIVE NEGATIVE Final   Influenza B by PCR NEGATIVE NEGATIVE Final    Comment: (NOTE) The Xpert Xpress SARS-CoV-2/FLU/RSV plus assay is intended as an aid in the diagnosis of influenza from Nasopharyngeal swab specimens and should not be used as a sole basis for treatment. Nasal washings and aspirates are unacceptable for Xpert Xpress SARS-CoV-2/FLU/RSV testing.  Fact Sheet for Patients: BloggerCourse.com  Fact Sheet for Healthcare Providers: SeriousBroker.it  This test is not yet approved or cleared by the [place] FDA and has been authorized for detection and/or diagnosis of SARS-CoV-2 by FDA under an Emergency Use Authorization (EUA). This EUA will remain in effect (meaning this test can be used) for the duration of the COVID-19 declaration under Section 564(b)(1) of the Act, 21 U.S.C. section 360bbb-3(b)(1), unless the authorization is terminated or revoked.  Performed at [facility], [address] [place], [place] [address]      Medications:    acidophilus  1 capsule Oral  Daily   enoxaparin (LOVENOX) injection  40 mg Subcutaneous Daily   furosemide  80 mg Intravenous BID   hydroxychloroquine  200 mg Oral BID   insulin aspart  0-6 Units Subcutaneous TID WC   leflunomide  20 mg  Oral QPM   LORazepam  1 mg Intravenous Once   senna-docusate  2 tablet Oral Q breakfast   And   senna-docusate  1 tablet Oral BID   Continuous Infusions:    LOS: 1 day   Marinda Elk  [facility]  07/06/2021, 9:32 AM

## 2021-07-07 DIAGNOSIS — R6510 Systemic inflammatory response syndrome (SIRS) of non-infectious origin without acute organ dysfunction: Secondary | ICD-10-CM

## 2021-07-07 DIAGNOSIS — M069 Rheumatoid arthritis, unspecified: Secondary | ICD-10-CM

## 2021-07-07 LAB — CBC WITH DIFFERENTIAL/PLATELET
Abs Immature Granulocytes: 0.05 10*3/uL (ref 0.00–0.07)
Basophils Absolute: 0 10*3/uL (ref 0.0–0.1)
Basophils Relative: 0 %
Eosinophils Absolute: 0.2 10*3/uL (ref 0.0–0.5)
Eosinophils Relative: 2 %
HCT: 30.6 % — ABNORMAL LOW (ref 36.0–46.0)
Hemoglobin: 9.7 g/dL — ABNORMAL LOW (ref 12.0–15.0)
Immature Granulocytes: 1 %
Lymphocytes Relative: 17 %
Lymphs Abs: 1.9 10*3/uL (ref 0.7–4.0)
MCH: 30.3 pg (ref 26.0–34.0)
MCHC: 31.7 g/dL (ref 30.0–36.0)
MCV: 95.6 fL (ref 80.0–100.0)
Monocytes Absolute: 0.6 10*3/uL (ref 0.1–1.0)
Monocytes Relative: 6 %
Neutro Abs: 8.1 10*3/uL — ABNORMAL HIGH (ref 1.7–7.7)
Neutrophils Relative %: 74 %
Platelets: 221 10*3/uL (ref 150–400)
RBC: 3.2 MIL/uL — ABNORMAL LOW (ref 3.87–5.11)
RDW: 18.3 % — ABNORMAL HIGH (ref 11.5–15.5)
WBC: 10.9 10*3/uL — ABNORMAL HIGH (ref 4.0–10.5)
nRBC: 0.6 % — ABNORMAL HIGH (ref 0.0–0.2)

## 2021-07-07 LAB — MAGNESIUM: Magnesium: 2 mg/dL (ref 1.7–2.4)

## 2021-07-07 LAB — PROCALCITONIN: Procalcitonin: 0.23 ng/mL

## 2021-07-07 LAB — GLUCOSE, CAPILLARY
Glucose-Capillary: 90 mg/dL (ref 70–99)
Glucose-Capillary: 90 mg/dL (ref 70–99)
Glucose-Capillary: 79 mg/dL (ref 70–99)
Glucose-Capillary: 78 mg/dL (ref 70–99)

## 2021-07-07 LAB — BASIC METABOLIC PANEL
Anion gap: 13 (ref 5–15)
BUN: 34 mg/dL — ABNORMAL HIGH (ref 8–23)
CO2: 24 mmol/L (ref 22–32)
Calcium: 8.8 mg/dL — ABNORMAL LOW (ref 8.9–10.3)
Chloride: 102 mmol/L (ref 98–111)
Creatinine, Ser: 1.37 mg/dL — ABNORMAL HIGH (ref 0.44–1.00)
GFR, Estimated: 37 mL/min — ABNORMAL LOW (ref >60)
Glucose, Bld: 87 mg/dL (ref 70–99)
Potassium: 3.9 mmol/L (ref 3.5–5.1)
Sodium: 139 mmol/L (ref 135–145)

## 2021-07-07 MED ORDER — LOPERAMIDE HCL 2 MG PO CAPS
2.0000 mg | ORAL_CAPSULE | Freq: Once | ORAL | Status: AC
  Administered 2021-07-07: 2 mg via ORAL
  Filled 2021-07-07: qty 1

## 2021-07-07 MED ORDER — HYDROCORTISONE 1 % EX CREA
TOPICAL_CREAM | Freq: Three times a day (TID) | CUTANEOUS | Status: DC | PRN
  Filled 2021-07-07: qty 28

## 2021-07-07 MED ORDER — SODIUM CHLORIDE 0.9 % IV BOLUS
250.0000 mL | Freq: Once | INTRAVENOUS | Status: AC
  Administered 2021-07-07: 250 mL via INTRAVENOUS

## 2021-07-07 NOTE — Progress Notes (Addendum)
[facility] PROGRESS NOTE    Progress Note  [name] [name]  ZHY:[id number] DOB: 19-Sep-1930 DOA: 07/04/2021 PCP: Pcp, No     Brief Narrative:   [name] [name] is an 87 y.o. female past medical history of chronic combined systolic and diastolic heart failure with an EF of 60%, rheumatoid arthritis, essential hypertension CAD PE currently not on anticoagulation comes into the ED for shortness of breath and acute encephalopathy.  CT angio of the chest was negative for PE CT of the head was unremarkable. BNP of 2000 creatinine mildly elevated from baseline.    Assessment/Plan:   Acute on chronic systolic CHF (congestive heart failure) (HCC): Requiring 1 L oxygen to keep saturations greater than 95%. Hold Lasix for today, there has been a significant improvement in her weight she now weighs 94 kg. On admission she was 97 kg. Continues regards and O's and daily weights. Potassium has improved with oral repletion. Can be restarted as an outpatient.  Fever and leukocytosis: On admission she had a temperature of 101.2 her white blood cell count is rising now is 14. CT chest showed no acute findings, urine culture showed only 2000 colonies of E. coli she is asymptomatic. Blood cultures have been negative till date Respiratory panel is negative Has remained afebrile her white blood cells trending down off antibiotics CT abdomen pelvis showed no acute findings, procalcitonin is low yield.  CKD stage 3a due to type 2 diabetes mellitus (HCC) With baseline creatinine of 1 on admission 1.2 we will see if is improved with diuresis.  Acute metabolic encephalopathy: Patient is currently afebrile, CT of the head showed no acute findings. MRI of the brain showed no acute findings. She does not appear focal on physical exam.  Uncontrolled hypertension: She was restarted on her home medications. Her blood pressure significantly improved.  We will continue current regimen.  Rheumatoid  arthritis involving both knees [facility]): Continue current  home regimen.  Normocytic anemia: Likely due to chronic renal disease.  Continue to monitor seems stable from previous admission.  History of CAD, Denies any chest pain.  History of diabetes mellitus type 2: A1c of 5.8 continue sliding scale insulin   DVT prophylaxis: lovenox Family Communication:none Status is: Inpatient  The patient will require care spanning > 2 midnights and should be moved to inpatient because: Hemodynamically unstable  Dispo: The patient is from: Home              Anticipated d/c is to: SNF              Patient currently is not medically stable to d/c.   Difficult to place patient No   Code Status:     Code Status Orders  (From admission, onward)           Start     Ordered   07/05/21 0630  Do not attempt resuscitation (DNR)  Continuous       Question Answer Comment  In the event of cardiac or respiratory ARREST Do not call a "code blue"   In the event of cardiac or respiratory ARREST Do not perform Intubation, CPR, defibrillation or ACLS   In the event of cardiac or respiratory ARREST Use medication by any route, position, wound care, and other measures to relive pain and suffering. May use oxygen, suction and manual treatment of airway obstruction as needed for comfort.      07/05/21 2952           Code Status History  Date Active Date Inactive Code Status Order ID Comments User Context   07/05/2021 0614 07/05/2021 0629 Full Code [id number]  Eduard Clos, MD ED   03/26/2021 1646 03/28/2021 2042 DNR [id number]  Dolan Amen, MD ED   08/27/2020 0746 03/26/2021 1138 Full Code [id number]  Kermit Balo, DO Outpatient   12/13/2019 0331 12/14/2019 0352 Full Code [id number]  Eduard Clos, MD ED   03/29/2019 1717 04/03/2019 2202 Full Code [id number]  Rolly Salter, MD ED   01/09/2019 1701 01/12/2019 1824 Full Code [id number]  Jonah Blue, MD Inpatient   12/13/2018 1523 12/20/2018  2209 Full Code [id number]  Alba Cory, MD Inpatient   11/15/2018 0343 11/17/2018 2355 Full Code [id number]  Eduard Clos, MD ED   10/21/2018 2111 10/25/2018 1458 Full Code [id number]  Kathrynn Running, MD Inpatient   09/05/2018 1932 09/07/2018 2115 Full Code [id number]  John Giovanni, MD ED   07/24/2018 0208 07/27/2018 1929 Full Code [id number]  Briscoe Deutscher, MD ED   06/23/2018 0451 06/27/2018 2316 Full Code [id number]  Eduard Clos, MD Inpatient   05/28/2018 2345 06/04/2018 2356 Full Code [id number]  Hugelmeyer, [place], DO Inpatient   02/02/2018 0606 02/04/2018 1957 DNR [id number]  [name], MD ED   11/26/2017 2055 11/29/2017 1715 DNR [id number]  Zigmund Daniel., MD Inpatient   05/25/2017 1811 05/27/2017 1956 DNR [id number]  Leatha Gilding, MD Inpatient   11/29/2016 1522 12/01/2016 1509 Full Code [id number]  Haydee Salter, MD ED   09/11/2015 0454 09/12/2015 1432 Full Code [id number]  Hillary Bow, DO ED   06/19/2015 2338 06/21/2015 1941 Full Code [id number]  Hillary Bow, DO ED   01/25/2014 2245 06/19/2015 2338 Full Code [id number]  Margit Hanks, MD Outpatient   01/17/2014 1305 01/22/2014 2142 Full Code [id number]  Drema Dallas, MD ED         IV Access:   Peripheral IV   Procedures and diagnostic studies:   MR BRAIN WO CONTRAST  Result Date: 07/05/2021 CLINICAL DATA:  87 year old female with altered mental status.  TIA. EXAM: MRI HEAD WITHOUT CONTRAST TECHNIQUE: Multiplanar, multiecho pulse sequences of the brain and surrounding structures were obtained without intravenous contrast. COMPARISON:  Head CT 07/04/2021.  Brain MRI 03/26/2021. FINDINGS: Brain: No restricted diffusion to suggest acute infarction. No midline shift, mass effect, evidence of mass lesion, ventriculomegaly, extra-axial collection or acute intracranial hemorrhage. Cervicomedullary junction and pituitary are within normal limits. Patchy and confluent bilateral cerebral white matter T2  and FLAIR hyperintensity is stable since May. No cortical encephalomalacia or chronic cerebral blood products identified. Deep gray matter nuclei, brainstem and cerebellum remain within normal limits. Vascular: Major intracranial vascular flow voids are stable. Skull and upper cervical spine: Negative visible cervical spine. Visualized bone marrow signal is within normal limits. Sinuses/Orbits: Orbits appear stable and negative. Unchanged sphenoid sinus mucosal thickening. Other: Trace bilateral mastoid fluid is stable. Negative visible nasopharynx. A small round 12 mm T2 hyperintense nodule in the posterior superficial left parotid gland has been present since at least 2015 compatible with benign etiology. Visualized scalp soft tissues are within normal limits. IMPRESSION: 1. No acute intracranial abnormality. 2. Stable moderate cerebral white matter signal changes, most likely due to chronic small vessel disease. Electronically Signed   By: Odessa Fleming [name]   On: 07/05/2021 10:31   CT ABDOMEN PELVIS W CONTRAST  Result Date: 07/06/2021 CLINICAL DATA:  Abdominal pain, fever EXAM: CT ABDOMEN AND PELVIS WITH CONTRAST  TECHNIQUE: Multidetector CT imaging of the abdomen and pelvis was performed using the standard protocol following bolus administration of intravenous contrast. CONTRAST:  79mL OMNIPAQUE IOHEXOL 350 MG/ML SOLN COMPARISON:  July 24, 2018, July 04, 2021 FINDINGS: Lower chest: Bibasilar atelectasis. Likely focal air trapping of the peripheral RIGHT middle lobe. Adjacent to this is a pulmonary nodule peripherally which measures 6 mm (series 5, image 14). Cardiomegaly. Three-vessel coronary artery atherosclerotic calcifications. Hepatobiliary: Status post cholecystectomy. Dilation of the common bile duct to 12 mm, stable to decreased in comparison to prior and likely reflecting the sequela of cholecystectomy. No new focal hepatic lesion. Pancreas: Unremarkable. No pancreatic ductal dilatation or  surrounding inflammatory changes. Spleen: Normal in size without focal abnormality. Adrenals/Urinary Tract: Adrenal glands are unremarkable. No hydronephrosis. RIGHT renal cyst. No obstructing nephrolithiasis. Excreted contrast within the bladder. Stomach/Bowel: No evidence of bowel obstruction. Diverticulosis. Patient is status post appendectomy. Small hiatal hernia. Vascular/Lymphatic: Severe atherosclerotic calcifications. No suspicious lymphadenopathy. Reproductive: Status post hysterectomy. No adnexal masses. Other: Small fat containing ventral hernia. Sequela of injection in the LEFT pannus. Musculoskeletal: Femoral head sclerosis may reflect underlying avascular necrosis. Stable lucent lesion with well-defined sclerotic margins of the proximal LEFT femur likely reflecting a benign fibro-osseous lesion. Degenerative changes of the lumbar spine with unchanged mild wedging L1. Unchanged anterolisthesis of L4-5. Transitional anatomy with assimilation joints at S1-2 IMPRESSION: 1. No CT etiology for abdominal pain identified. 2. Nodular opacity in the RIGHT middle lobe measures 6 mm. Non-contrast chest CT at 6-12 months is recommended. If the nodule is stable at time of repeat CT, then future CT at 18-24 months (from today's scan) is considered optional for low-risk patients, but is recommended for high-risk patients. This recommendation follows the consensus statement: Guidelines for Management of Incidental Pulmonary Nodules Detected on CT Images: From the Fleischner Society 2017; Radiology 2017; 284:228-243. Aortic Atherosclerosis (ICD10-I70.0). Electronically Signed   By: Meda Klinefelter [name]   On: 07/06/2021 16:49   VAS [place] LOWER EXTREMITY VENOUS (DVT)  Result Date: 07/05/2021  Lower Venous DVT Study Patient Name:  [name] [name]  Date of Exam:   07/05/2021 Medical Rec #: [id number]         Accession #:    [id number] Date of Birth: 1930/03/28         Patient Gender: F Patient Age:   38 years Exam  Location:  [facility] Procedure:      VAS [place] LOWER EXTREMITY VENOUS (DVT) Referring Phys: Midge Minium --------------------------------------------------------------------------------  Indications: SOB, and Swelling.  Risk Factors: History of PE and DVT. Limitations: Body habitus, poor ultrasound/tissue interface and patient intolerance to compression. Comparison Study: 07-25-2018 Bilateral lower extremity venous study was positive                   for DVT involving the right peroneal veins. Performing Technologist: Jean Rosenthal RDMS, RVT  Examination Guidelines: A complete evaluation includes B-mode imaging, spectral Doppler, color Doppler, and power Doppler as needed of all accessible portions of each vessel. Bilateral testing is considered an integral part of a complete examination. Limited examinations for reoccurring indications may be performed as noted. The reflux portion of the exam is performed with the patient in reverse Trendelenburg.  +---------+---------------+---------+-----------+----------+--------------+ RIGHT    CompressibilityPhasicitySpontaneityPropertiesThrombus Aging +---------+---------------+---------+-----------+----------+--------------+ CFV      Full           Yes      Yes                                 +---------+---------------+---------+-----------+----------+--------------+  SFJ      Full                                                        +---------+---------------+---------+-----------+----------+--------------+ FV Prox  Full                                                        +---------+---------------+---------+-----------+----------+--------------+ FV Mid                  Yes      Yes                                 +---------+---------------+---------+-----------+----------+--------------+ FV Distal               Yes      Yes                                  +---------+---------------+---------+-----------+----------+--------------+ PFV      Full                                                        +---------+---------------+---------+-----------+----------+--------------+ POP      Full           Yes      Yes                                 +---------+---------------+---------+-----------+----------+--------------+ PTV      Full                                                        +---------+---------------+---------+-----------+----------+--------------+ PERO     Full                                                        +---------+---------------+---------+-----------+----------+--------------+   +---------+---------------+---------+-----------+----------+-------------------+ LEFT     CompressibilityPhasicitySpontaneityPropertiesThrombus Aging      +---------+---------------+---------+-----------+----------+-------------------+ CFV      Full           Yes      Yes                                      +---------+---------------+---------+-----------+----------+-------------------+ SFJ      Full                                                             +---------+---------------+---------+-----------+----------+-------------------+  FV Prox  Partial        Yes      Yes                  Age Indeterminate   +---------+---------------+---------+-----------+----------+-------------------+ FV Mid                  No       No                   Chronic             +---------+---------------+---------+-----------+----------+-------------------+ FV Distal               No       No                   Chronic             +---------+---------------+---------+-----------+----------+-------------------+ PFV      Full                                                             +---------+---------------+---------+-----------+----------+-------------------+ POP      Full           Yes      Yes                                       +---------+---------------+---------+-----------+----------+-------------------+ PTV      Full                                                             +---------+---------------+---------+-----------+----------+-------------------+ PERO                                                  Not well visualized +---------+---------------+---------+-----------+----------+-------------------+     Summary: RIGHT: - There is no evidence of deep vein thrombosis in the lower extremity. However, portions of this examination were limited- see technologist comments above.  - No cystic structure found in the popliteal fossa.  LEFT: - Findings consistent with age indeterminate deep vein thrombosis involving the proximal left femoral vein. - Findings consistent with chronic deep vein thrombosis involving the mid to distal left femoral vein.  *See table(s) above for measurements and observations. Electronically signed by Sherald Hess MD on 07/05/2021 at 1:28:45 PM.    Final      Medical Consultants:   None.   Subjective:    [name] [name] no complaints feels great  Objective:    Vitals:   07/06/21 1539 07/06/21 2031 07/06/21 2334 07/07/21 0300  BP: (!) 134/92 (!) 142/92 (!) 157/90 (!) 150/83  Pulse: 93  90 95  Resp: 19  19 20   Temp: 97.8 F (36.6 C) 98.1 F (36.7 C)  98.7 F (37.1 C)  TempSrc: Oral Oral  Axillary  SpO2: 100%  98% 100%  Weight:    95 kg  Height:       SpO2: 100 % O2 Flow Rate (L/min): 1 L/min   Intake/Output Summary (Last 24 hours) at 07/07/2021 1014 Last data filed at 07/06/2021 1800 Gross per 24 hour  Intake --  Output 550 ml  Net -550 ml    Filed Weights   07/05/21 1428 07/06/21 0213 07/07/21 0300  Weight: 97 kg 94 kg 95 kg    Exam: General exam: In no acute distress. Respiratory system: Good air movement and clear to auscultation. Cardiovascular system: S1 & S2 heard, RRR. No JVD. Gastrointestinal system:  Abdomen is nondistended, soft and nontender.  Extremities: No pedal edema. Skin: No rashes, lesions or ulcers  Data Reviewed:    Labs: Basic Metabolic Panel: Recent Labs  Lab 07/04/21 1950 07/05/21 0656 07/06/21 0444 07/07/21 0118  NA 137  --  139 139  K 3.6  --  2.9* 3.9  CL 104  --  101 102  CO2 25  --  25 24  GLUCOSE 96  --  143* 87  BUN 33*  --  32* 34*  CREATININE 1.29* 1.18* 1.10* 1.37*  CALCIUM 8.7*  --  9.0 8.8*  MG  --   --   --  2.0    GFR Estimated Creatinine Clearance: 31.1 mL/min (A) (by C-G formula based on SCr of 1.37 mg/dL (H)). Liver Function Tests: Recent Labs  Lab 07/04/21 1950  AST 13*  ALT 13  ALKPHOS 58  BILITOT 0.7  PROT 6.2*  ALBUMIN 3.3*    No results for input(s): LIPASE, AMYLASE in the last 168 hours. No results for input(s): AMMONIA in the last 168 hours. Coagulation profile Recent Labs  Lab 07/05/21 0005  INR 1.2    COVID-19 Labs  No results for input(s): DDIMER, FERRITIN, LDH, CRP in the last 72 hours.  Lab Results  Component Value Date   SARSCOV2NAA NEGATIVE 07/04/2021   SARSCOV2NAA NEGATIVE 03/26/2021   SARSCOV2NAA NEGATIVE 12/13/2019   SARSCOV2NAA POSITIVE (A) 10/26/2019    CBC: Recent Labs  Lab 07/04/21 1950 07/05/21 0656 07/06/21 0444 07/07/21 0118  WBC 9.0 11.7* 14.0* 10.9*  NEUTROABS 6.7  --   --  8.1*  HGB 10.6* 10.5* 9.8* 9.7*  HCT 33.3* 34.4* 29.8* 30.6*  MCV 98.5 99.7 94.3 95.6  PLT 209 210 225 221    Cardiac Enzymes: No results for input(s): CKTOTAL, CKMB, CKMBINDEX, TROPONINI in the last 168 hours. BNP (last 3 results) No results for input(s): PROBNP in the last 8760 hours. CBG: Recent Labs  Lab 07/06/21 0738 07/06/21 1156 07/06/21 1659 07/06/21 2039 07/07/21 0736  GLUCAP 138* 120* 97 110* 90    D-Dimer: No results for input(s): DDIMER in the last 72 hours. Hgb A1c: No results for input(s): HGBA1C in the last 72 hours. Lipid Profile: No results for input(s): CHOL, HDL, LDLCALC,  TRIG, CHOLHDL, LDLDIRECT in the last 72 hours. Thyroid function studies: No results for input(s): TSH, T4TOTAL, T3FREE, THYROIDAB in the last 72 hours.  Invalid input(s): FREET3 Anemia work up: No results for input(s): VITAMINB12, FOLATE, FERRITIN, TIBC, IRON, RETICCTPCT in the last 72 hours. Sepsis Labs: Recent Labs  Lab 07/04/21 1950 07/05/21 0656 07/06/21 0444 07/06/21 1045 07/07/21 0118  PROCALCITON  --   --   --  0.21 0.23  WBC 9.0 11.7* 14.0*  --  10.9*  LATICACIDVEN 1.1  --   --   --   --     Microbiology Recent Results (from the past 240 hour(s))  Blood Culture (  routine x 2)     Status: None (Preliminary result)   Collection Time: 07/04/21  7:50 PM   Specimen: BLOOD  Result Value Ref Range Status   Specimen Description BLOOD LEFT ARM  Final   Special Requests   Final    BOTTLES DRAWN AEROBIC AND ANAEROBIC Blood Culture results may not be optimal due to an excessive volume of blood received in culture bottles   Culture   Final    NO GROWTH 3 DAYS Performed at [facility], [address] [place], [place] [address]    Report Status PENDING  Incomplete  Resp Panel by RT-PCR (Flu A&B, Covid) Nasopharyngeal Swab     Status: None   Collection Time: 07/04/21  9:16 PM   Specimen: Nasopharyngeal Swab; Nasopharyngeal(NP) swabs in vial transport medium  Result Value Ref Range Status   SARS Coronavirus 2 by RT PCR NEGATIVE NEGATIVE Final    Comment: (NOTE) SARS-CoV-2 target nucleic acids are NOT DETECTED.  The SARS-CoV-2 RNA is generally detectable in upper respiratory specimens during the acute phase of infection. The lowest concentration of SARS-CoV-2 viral copies this assay can detect is 138 copies/mL. A negative result does not preclude SARS-Cov-2 infection and should not be used as the sole basis for treatment or other patient management decisions. A negative result may occur with  improper specimen collection/handling, submission of specimen other than  nasopharyngeal swab, presence of viral mutation(s) within the areas targeted by this assay, and inadequate number of viral copies(<138 copies/mL). A negative result must be combined with clinical observations, patient history, and epidemiological information. The expected result is Negative.  Fact Sheet for Patients:  BloggerCourse.com  Fact Sheet for Healthcare Providers:  SeriousBroker.it  This test is no t yet approved or cleared by the [place] FDA and  has been authorized for detection and/or diagnosis of SARS-CoV-2 by FDA under an Emergency Use Authorization (EUA). This EUA will remain  in effect (meaning this test can be used) for the duration of the COVID-19 declaration under Section 564(b)(1) of the Act, 21 U.S.C.section 360bbb-3(b)(1), unless the authorization is terminated  or revoked sooner.       Influenza A by PCR NEGATIVE NEGATIVE Final   Influenza B by PCR NEGATIVE NEGATIVE Final    Comment: (NOTE) The Xpert Xpress SARS-CoV-2/FLU/RSV plus assay is intended as an aid in the diagnosis of influenza from Nasopharyngeal swab specimens and should not be used as a sole basis for treatment. Nasal washings and aspirates are unacceptable for Xpert Xpress SARS-CoV-2/FLU/RSV testing.  Fact Sheet for Patients: BloggerCourse.com  Fact Sheet for Healthcare Providers: SeriousBroker.it  This test is not yet approved or cleared by the [place] FDA and has been authorized for detection and/or diagnosis of SARS-CoV-2 by FDA under an Emergency Use Authorization (EUA). This EUA will remain in effect (meaning this test can be used) for the duration of the COVID-19 declaration under Section 564(b)(1) of the Act, 21 U.S.C. section 360bbb-3(b)(1), unless the authorization is terminated or revoked.  Performed at [facility], [address], [place], [place] [address]    Blood Culture (routine x 2)     Status: None (Preliminary result)   Collection Time: 07/05/21 12:06 AM   Specimen: BLOOD  Result Value Ref Range Status   Specimen Description BLOOD RIGHT ARM  Final   Special Requests   Final    BOTTLES DRAWN AEROBIC AND ANAEROBIC Blood Culture adequate volume   Culture   Final  NO GROWTH 2 DAYS Performed at [facility], [address], [place], [place] [address]    Report Status PENDING  Incomplete  Urine Culture     Status: Abnormal (Preliminary result)   Collection Time: 07/05/21  4:24 AM   Specimen: In/Out Cath Urine  Result Value Ref Range Status   Specimen Description IN/OUT CATH URINE  Final   Special Requests NONE  Final   Culture (A)  Final    2,000 COLONIES/mL ESCHERICHIA COLI Confirmed Extended Spectrum Beta-Lactamase Producer (ESBL).  In bloodstream infections from ESBL organisms, carbapenems are preferred over piperacillin/tazobactam. They are shown to have a lower risk of mortality. 1,000 COLONIES/mL ENTEROCOCCUS FAECALIS SUSCEPTIBILITIES TO FOLLOW Performed at [facility], [address] [place], [place] [address]    Report Status PENDING  Incomplete   Organism ID, Bacteria ESCHERICHIA COLI (A)  Final      Susceptibility   Escherichia coli - MIC*    AMPICILLIN >=32 RESISTANT Resistant     CEFAZOLIN >=64 RESISTANT Resistant     CEFEPIME 2 SENSITIVE Sensitive     CEFTRIAXONE >=64 RESISTANT Resistant     CIPROFLOXACIN >=4 RESISTANT Resistant     GENTAMICIN >=16 RESISTANT Resistant     IMIPENEM <=0.25 SENSITIVE Sensitive     NITROFURANTOIN <=16 SENSITIVE Sensitive     TRIMETH/SULFA <=20 SENSITIVE Sensitive     AMPICILLIN/SULBACTAM >=32 RESISTANT Resistant     PIP/TAZO <=4 SENSITIVE Sensitive     * 2,000 COLONIES/mL ESCHERICHIA COLI  Respiratory (~20 pathogens) panel by PCR     Status: None   Collection Time: 07/06/21 12:20 PM   Specimen: Nasopharyngeal Swab; Respiratory  Result Value Ref Range Status    Adenovirus NOT DETECTED NOT DETECTED Final   Coronavirus 229E NOT DETECTED NOT DETECTED Final    Comment: (NOTE) The Coronavirus on the Respiratory Panel, DOES NOT test for the novel  Coronavirus (2019 nCoV)    Coronavirus HKU1 NOT DETECTED NOT DETECTED Final   Coronavirus NL63 NOT DETECTED NOT DETECTED Final   Coronavirus OC43 NOT DETECTED NOT DETECTED Final   Metapneumovirus NOT DETECTED NOT DETECTED Final   Rhinovirus / Enterovirus NOT DETECTED NOT DETECTED Final   Influenza A NOT DETECTED NOT DETECTED Final   Influenza B NOT DETECTED NOT DETECTED Final   Parainfluenza Virus 1 NOT DETECTED NOT DETECTED Final   Parainfluenza Virus 2 NOT DETECTED NOT DETECTED Final   Parainfluenza Virus 3 NOT DETECTED NOT DETECTED Final   Parainfluenza Virus 4 NOT DETECTED NOT DETECTED Final   Respiratory Syncytial Virus NOT DETECTED NOT DETECTED Final   Bordetella pertussis NOT DETECTED NOT DETECTED Final   Bordetella Parapertussis NOT DETECTED NOT DETECTED Final   Chlamydophila pneumoniae NOT DETECTED NOT DETECTED Final   Mycoplasma pneumoniae NOT DETECTED NOT DETECTED Final    Comment: Performed at [facility], [address], [place], [place] [address]     Medications:    acidophilus  1 capsule Oral Daily   enoxaparin (LOVENOX) injection  40 mg Subcutaneous Daily   hydroxychloroquine  200 mg Oral BID   insulin aspart  0-6 Units Subcutaneous TID WC   leflunomide  20 mg Oral QPM   LORazepam  1 mg Intravenous Once   senna-docusate  2 tablet Oral Q breakfast   And   senna-docusate  1 tablet Oral BID   Continuous Infusions:    LOS: 2 days   Marinda Elk  [facility]  07/07/2021, 10:14 AM

## 2021-07-07 NOTE — TOC Initial Note (Addendum)
Transition of Care [facility]) - Initial/Assessment Note    Patient Details  Name: [name] [name] MRN: [id number] Date of Birth: Apr 05, 1930  [facility]) CM/SW Contact:    Terrial Rhodes, LCSWA Phone Number: 07/07/2021, 5:04 PM  Clinical Narrative:                  CSW received consult for possible SNF placement at time of discharge. CSW spoke with patients daughter Para March regarding PT recommendation of SNF placement at time of discharge. Patients daughter expressed understanding of PT recommendation and is agreeable to SNF placement for patient at time of discharge. Patients daughter reports patient comes from [facility]. Patients daughter reports plan is to [facility]. CSW spoke with S[facility] with [facility] who confirmed CSW does not need to start insurance authorization for patient to return for SNF. No further questions reported at this time. CSW to continue to follow and assist with discharge planning needs.   Expected Discharge Plan: Skilled Nursing Facility Barriers to Discharge: Continued Medical Work up   Patient Goals and CMS Choice Patient states their goals for this hospitalization and ongoing recovery are:: to go to SNF CMS Medicare.gov Compare Post Acute Care list provided to:: Patient Choice offered to / list presented to : Patient  Expected Discharge Plan and Services Expected Discharge Plan: Skilled Nursing Facility In-house Referral: Clinical Social Work     Living arrangements for the past 2 months: Skilled Nursing Facility                                      Prior Living Arrangements/Services Living arrangements for the past 2 months: Skilled Nursing Facility Lives with:: Self, Facility Resident Patient language and need for interpreter reviewed:: Yes Do you feel safe going back to the place where you live?: No   SNF  Need for Family Participation in Patient Care: Yes (Comment) Care giver  support system in place?: Yes (comment)   Criminal Activity/Legal Involvement Pertinent to Current Situation/Hospitalization: No - Comment as needed  Activities of Daily Living Home Assistive Devices/Equipment: Wheelchair, Environmental consultant (specify type) ADL Screening (condition at time of admission) [name] cognitive ability adequate to safely complete daily activities?: Yes Is the patient deaf or have difficulty hearing?: No Does the patient have difficulty seeing, even when wearing glasses/contacts?: No Does the patient have difficulty concentrating, remembering, or making decisions?: No Patient able to express need for assistance with ADLs?: Yes Does the patient have difficulty dressing or bathing?: Yes Independently performs ADLs?: No Communication: Independent Dressing (OT): Dependent Is this a change from baseline?: Pre-admission baseline Grooming: Dependent Is this a change from baseline?: Pre-admission baseline Feeding: Needs assistance Is this a change from baseline?: Pre-admission baseline Bathing: Dependent Is this a change from baseline?: Pre-admission baseline Toileting: Dependent Is this a change from baseline?: Pre-admission baseline In/Out Bed: Dependent Is this a change from baseline?: Pre-admission baseline Walks in Home: Dependent Is this a change from baseline?: Pre-admission baseline Does the patient have difficulty walking or climbing stairs?: Yes Weakness of Legs: Both Weakness of Arms/Hands: Both  Permission Sought/Granted Permission sought to share information with : Case Manager, Family Supports, Oceanographer granted to share information with : Yes, Verbal Permission Granted     Permission granted to share info w AGENCY: SNF        Emotional Assessment  Attitude/Demeanor/Rapport: Gracious Affect (typically observed): Calm Orientation: : Oriented to Self, Oriented to Place, Oriented to  Time, Oriented to Situation  (WDL) Alcohol / Substance Use: Not Applicable Psych Involvement: No (comment)  Admission diagnosis:  Leg edema [R60.0] Weakness [R53.1] Wheeze [R06.2] Acute CHF (congestive heart failure) (HCC) [I50.9] SIRS (systemic inflammatory response syndrome) (HCC) [R65.10] Acute on chronic combined systolic and diastolic congestive heart failure (HCC) [I50.43] Acute respiratory failure with hypoxia (HCC) [J96.01] Acute on chronic systolic CHF (congestive heart failure) (HCC) [I50.23] Patient Active Problem List   Diagnosis Date Noted   Acute on chronic systolic CHF (congestive heart failure) (HCC) 07/05/2021   Acute CHF (congestive heart failure) (HCC) 07/05/2021   Acute respiratory failure with hypoxia (HCC) 07/05/2021   Altered mental status 03/26/2021   Rheumatoid arthritis involving multiple sites with positive rheumatoid factor (HCC) 08/27/2020   Near syncope 12/13/2019   Pre-operative clearance 05/29/2019   Volume overload 05/29/2019   Acute metabolic encephalopathy 03/30/2019   Pressure injury of skin 01/12/2019   AMS (altered mental status) 01/09/2019   Rheumatoid arthritis flare (HCC) 12/23/2018   Weakness of right lower extremity 12/23/2018   Abscess of right upper extremity 12/14/2018   Fever 12/13/2018   Hand swelling 12/02/2018   Pain, wrist 12/02/2018   Hand pain 12/02/2018   Polyarthritis 12/02/2018   Spinal stenosis of lumbar region without neurogenic claudication 11/19/2018   Acute encephalopathy 11/19/2018   History of CVA (cerebrovascular accident) 11/19/2018   Acute gout 11/15/2018   Low back pain 11/14/2018   Chronic back pain 10/27/2018   Arthralgia of both knees 10/27/2018   Rheumatoid arthritis involving both knees (HCC) 10/27/2018   Acute pain 10/21/2018   Physical deconditioning 09/06/2018   Hyperlipidemia associated with type 2 diabetes mellitus (HCC), refuses statin 09/06/2018   Refusal of statin medication by patient 09/04/2018   Deep vein thrombosis  (DVT) of distal vein of right lower extremity (HCC) 07/30/2018   Hypertensive heart disease with heart failure (HCC) 07/30/2018   Pulmonary embolism (HCC), 07/2018, on Xarelto with goal to DC after 6 months if more active 07/10/2018   Elevated brain natriuretic peptide (BNP) level    DCM (dilated cardiomyopathy) (HCC)    Benign hypertensive heart and kidney disease with CHF and stage 3 chronic kidney disease (HCC) 05/28/2018   Osteoarthritis 05/06/2018   Hypokalemia 02/02/2018   Adhesive capsulitis of right shoulder 10/12/2017   Idiopathic chronic gout of multiple sites with tophus 08/25/2017   History of total knee arthroplasty, bilateral 02/18/2017   History of rotator cuff surgery 02/18/2017   High risk medications, long-term use 09/21/2016   Chronic combined systolic and diastolic CHF (congestive heart failure) (HCC) 09/12/2015   Bilateral edema of lower extremity 06/05/2014   CKD stage 3 due to type 2 diabetes mellitus (HCC) 06/05/2014   Type 2 diabetes mellitus with diabetic nephropathy, without long-term current use of insulin (HCC) 06/05/2014    Class: Chronic   Anemia of chronic disease 05/01/2014   CAD (coronary artery disease) 10/18/2013   Hypertension associated with diabetes (HCC) 08/08/2013    Class: Chronic   Fibromyalgia 05/10/2013   Type 2 diabetes mellitus with hyperlipidemia (HCC), patient declines statin 04/27/2007   Allergic rhinitis 04/27/2007   Diverticulosis 04/27/2007   PCP:  Pcp, No Pharmacy:  No Pharmacies Listed    Social Determinants of Health (SDOH) Interventions    Readmission Risk Interventions No flowsheet data found.

## 2021-07-07 NOTE — NC FL2 (Addendum)
[place] MEDICAID FL2 LEVEL OF CARE SCREENING TOOL     IDENTIFICATION  Patient Name: [name] [name] Birthdate: 03-08-1930 Sex: female Admission Date (Current Location): 07/04/2021  [facility] and [place] Number:  Producer, television/film/video and Address:  [facility] [place], [address] [place], [place] [address]      Provider Number: [id number]  Attending Physician Name and Address:  David Stall, Darin Engels, MD  Relative Name and Phone Number:  Para March [phone]    Current Level of Care: Hospital Recommended Level of Care: Skilled Nursing Facility Prior Approval Number:    Date Approved/Denied:   PASRR Number: [id number]  Discharge Plan: SNF    Current Diagnoses: Patient Active Problem List   Diagnosis Date Noted   Acute on chronic systolic CHF (congestive heart failure) (HCC) 07/05/2021   Acute CHF (congestive heart failure) (HCC) 07/05/2021   Acute respiratory failure with hypoxia (HCC) 07/05/2021   Altered mental status 03/26/2021   Rheumatoid arthritis involving multiple sites with positive rheumatoid factor (HCC) 08/27/2020   Near syncope 12/13/2019   Pre-operative clearance 05/29/2019   Volume overload 05/29/2019   Acute metabolic encephalopathy 03/30/2019   Pressure injury of skin 01/12/2019   AMS (altered mental status) 01/09/2019   Rheumatoid arthritis flare (HCC) 12/23/2018   Weakness of right lower extremity 12/23/2018   Abscess of right upper extremity 12/14/2018   Fever 12/13/2018   Hand swelling 12/02/2018   Pain, wrist 12/02/2018   Hand pain 12/02/2018   Polyarthritis 12/02/2018   Spinal stenosis of lumbar region without neurogenic claudication 11/19/2018   Acute encephalopathy 11/19/2018   History of CVA (cerebrovascular accident) 11/19/2018   Acute gout 11/15/2018   Low back pain 11/14/2018   Chronic back pain 10/27/2018   Arthralgia of both knees 10/27/2018   Rheumatoid arthritis involving both knees (HCC) 10/27/2018    Acute pain 10/21/2018   Physical deconditioning 09/06/2018   Hyperlipidemia associated with type 2 diabetes mellitus (HCC), refuses statin 09/06/2018   Refusal of statin medication by patient 09/04/2018   Deep vein thrombosis (DVT) of distal vein of right lower extremity (HCC) 07/30/2018   Hypertensive heart disease with heart failure (HCC) 07/30/2018   Pulmonary embolism (HCC), 07/2018, on Xarelto with goal to DC after 6 months if more active 07/10/2018   Elevated brain natriuretic peptide (BNP) level    DCM (dilated cardiomyopathy) (HCC)    Benign hypertensive heart and kidney disease with CHF and stage 3 chronic kidney disease (HCC) 05/28/2018   Osteoarthritis 05/06/2018   Hypokalemia 02/02/2018   Adhesive capsulitis of right shoulder 10/12/2017   Idiopathic chronic gout of multiple sites with tophus 08/25/2017   History of total knee arthroplasty, bilateral 02/18/2017   History of rotator cuff surgery 02/18/2017   High risk medications, long-term use 09/21/2016   Chronic combined systolic and diastolic CHF (congestive heart failure) (HCC) 09/12/2015   Bilateral edema of lower extremity 06/05/2014   CKD stage 3 due to type 2 diabetes mellitus (HCC) 06/05/2014   Type 2 diabetes mellitus with diabetic nephropathy, without long-term current use of insulin (HCC) 06/05/2014   Anemia of chronic disease 05/01/2014   CAD (coronary artery disease) 10/18/2013   Hypertension associated with diabetes (HCC) 08/08/2013   Fibromyalgia 05/10/2013   Type 2 diabetes mellitus with hyperlipidemia (HCC), patient declines statin 04/27/2007   Allergic rhinitis 04/27/2007   Diverticulosis 04/27/2007    Orientation RESPIRATION BLADDER Height & Weight     Self, Time, Situation, Place (WDL)  O2 (1 liter  of nasal cannula) Incontinent Weight: 209 lb 7 oz (95 kg) Height:  5\' 4"  (162.6 cm)  BEHAVIORAL SYMPTOMS/MOOD NEUROLOGICAL BOWEL NUTRITION STATUS      Incontinent Diet (Please see discharge summary)   AMBULATORY STATUS COMMUNICATION OF NEEDS Skin   Limited Assist Verbally Normal                       Personal Care Assistance Level of Assistance  Bathing, Feeding, Dressing Bathing Assistance: Maximum assistance Feeding assistance: Independent Dressing Assistance: Maximum assistance     Functional Limitations Info  Sight, Hearing, Speech     Speech Info: Adequate    SPECIAL CARE FACTORS FREQUENCY  PT (By licensed PT), OT (By licensed OT)     PT Frequency: 5x min weekly OT Frequency: 5x min weekly            Contractures Contractures Info: Not present    Additional Factors Info  Code Status, Allergies, Insulin Sliding Scale, Psychotropic Code Status Info: DNR Allergies Info: Clonidine Derivatives,Fish Allergy,Shellfish Allergy,Doxycycline,Cetirizine Hcl,Citrus,Indomethacin,Methyldopa,Morphine And Related,Morphine Sulfate,Pregabalin,Shellfish-derived Products,Strawberry (diagnostic),Strawberry Extract,Cetirizine Hcl,Codeine,Levaquin (levofloxacin In D5w),Levofloxacin,Tomato Psychotropic Info: LORazepam (ATIVAN) injection 1 mg once Insulin Sliding Scale Info: insulin aspart (novoLOG) injection 0-6 Units 3 times daily with meals,       Current Medications (07/07/2021):  This is the current hospital active medication list Current Facility-Administered Medications  Medication Dose Route Frequency Provider Last Rate Last Admin   acetaminophen (TYLENOL) tablet 650 mg  650 mg Oral Q6H PRN 07/09/2021, MD       Or   acetaminophen (TYLENOL) suppository 650 mg  650 mg Rectal Q6H PRN Eduard Clos, MD       acidophilus (RISAQUAD) capsule 1 capsule  1 capsule Oral Daily Eduard Clos, MD   1 capsule at 07/07/21 07/09/21   hydrALAZINE (APRESOLINE) injection 10 mg  10 mg Intravenous Q4H PRN [id number], MD   10 mg at 07/05/21 1828   hydrocortisone cream 1 %   Topical TID PRN 07/07/21, MD       hydroxychloroquine (PLAQUENIL) tablet 200 mg   200 mg Oral BID Marinda Elk, MD   200 mg at 07/07/21 0848   insulin aspart (novoLOG) injection 0-6 Units  0-6 Units Subcutaneous TID WC 07/09/21, MD       leflunomide (ARAVA) tablet 20 mg  20 mg Oral QPM Eduard Clos, MD   20 mg at 07/06/21 1703   LORazepam (ATIVAN) injection 1 mg  1 mg Intravenous Once 07/08/21, David Stall, MD       polyvinyl alcohol (LIQUIFILM TEARS) 1.4 % ophthalmic solution 2 drop  2 drop Both Eyes PRN Darin Engels, MD   2 drop at 07/06/21 0815   senna-docusate (Senokot-S) tablet 2 tablet  2 tablet Oral Q breakfast 07/08/21, MD   2 tablet at 07/06/21 07/08/21   And   senna-docusate (Senokot-S) tablet 1 tablet  1 tablet Oral BID 7530, MD   1 tablet at 07/05/21 2124     Discharge Medications: Please see discharge summary for a list of discharge medications.  Relevant Imaging Results:  Relevant Lab Results:   Additional Information [id number] ESBL precautions   01-14-1976, LCSWA

## 2021-07-07 NOTE — Progress Notes (Signed)
Pharmacist Heart Failure Core Measure Documentation  Assessment: [name] [name] has an EF documented as 60% on 5/22 by ECHO.  Rationale: Heart failure patients with left ventricular systolic dysfunction (LVSD) and an EF < 40% should be prescribed an angiotensin converting enzyme inhibitor (ACEI) or angiotensin receptor blocker (ARB) at discharge unless a contraindication is documented in the medical record.  This patient is not currently on an ACEI or ARB for HF.  This note is being placed in the record in order to provide documentation that a contraindication to the use of these agents is present for this encounter.  ACE Inhibitor or Angiotensin Receptor Blocker is contraindicated (specify all that apply) Patient does not fit criteria with EF >40%  []   ACEI allergy AND ARB allergy []   Angioedema []   Moderate or severe aortic stenosis []   Hyperkalemia []   Hypotension []   Renal artery stenosis [x]   Worsening renal function, preexisting renal disease or dysfunction   Pharm.D. CPP, BCPS Clinical Pharmacist [phone] 07/07/2021 3:09 PM

## 2021-07-07 NOTE — Evaluation (Signed)
Physical Therapy Evaluation Patient Details Name: [name] [name] MRN: [id number] DOB: November 03, 1930 Today's Date: 07/07/2021   History of Present Illness  [name] [name] is a 87 y.o. female with history of chronic combined systolic and diastolic heart failure last EF measured was 60 to 65% on May 2022 with history of rheumatoid arthritis bedbound, hypertension, CAD prior history of PE presently not on anticoagulation was brought to the ER after patient was found to be increasingly short of breath and confused.   Clinical Impression  Patient received in bed, very pleasant, family present. Patient reports she is lifted mechanically to chair at facility. She requires assistance to roll at baseline. Patient appears to be at baseline level of function and should return to prior living situation. No further PT needs at this time.        Follow Up Recommendations SNF;Supervision/Assistance - 24 hour    Equipment Recommendations  None recommended by PT    Recommendations for Other Services       Precautions / Restrictions Precautions Precautions: Fall Restrictions Weight Bearing Restrictions: No      Mobility  Bed Mobility Overal bed mobility: Needs Assistance Bed Mobility: Rolling Rolling: +2 for physical assistance;Max assist              Transfers Overall transfer level: Needs assistance               General transfer comment: staff at facility use hoyer to get patient up at baseline. For past 2 years per patient. Not attempted this session.  Ambulation/Gait             General Gait Details: patient is not ambulatory at baseline  Stairs            Wheelchair Mobility    Modified Rankin (Stroke Patients Only)       Balance                                             Pertinent Vitals/Pain Pain Assessment: No/denies pain    Home Living Family/patient expects to be discharged to:: Skilled nursing facility                  Additional Comments: hoyer lift - at ALF and home    Prior Function Level of Independence: Needs assistance   Gait / Transfers Assistance Needed: hoyer lift OOB to w/c; previously pt was using sliding board but has not in a while and states there are days when she doesn't get out of bed because of lack of assist  ADL's / Homemaking Assistance Needed: Requires assist for all ADLs, feeds and grooms self        Hand Dominance   Dominant Hand: Right    Extremity/Trunk Assessment   Upper Extremity Assessment Upper Extremity Assessment: Generalized weakness    Lower Extremity Assessment Lower Extremity Assessment: Generalized weakness       Communication   Communication: No difficulties  Cognition Arousal/Alertness: Awake/alert Behavior During Therapy: WFL for tasks assessed/performed Overall Cognitive Status: Within Functional Limits for tasks assessed                                        General Comments      Exercises Other Exercises Other Exercises: AAROM of B  LEs for knee flexion, SLR, hip abd/add x 10 reps, B UE shoulder flexion x 5, bicep curls x 10, hand open./close x 10   Assessment/Plan    PT Assessment Patent does not need any further PT services  PT Problem List         PT Treatment Interventions      PT Goals (Current goals can be found in the Care Plan section)  Acute Rehab PT Goals Patient Stated Goal: to return to facility PT Goal Formulation: With patient/family Time For Goal Achievement: 07/09/21 Potential to Achieve Goals: Good    Frequency     Barriers to discharge        Co-evaluation               AM-PAC PT "6 Clicks" Mobility  Outcome Measure Help needed turning from your back to your side while in a flat bed without using bedrails?: A Lot Help needed moving from lying on your back to sitting on the side of a flat bed without using bedrails?: Total Help needed moving to and from a bed to a chair  (including a wheelchair)?: Total Help needed standing up from a chair using your arms (e.g., wheelchair or bedside chair)?: Total Help needed to walk in hospital room?: Total Help needed climbing 3-5 steps with a railing? : Total 6 Click Score: 7    End of Session   Activity Tolerance: Patient limited by lethargy Patient left: in bed;with call bell/phone within reach;with family/visitor present Nurse Communication: Mobility status;Need for lift equipment PT Visit Diagnosis: Other abnormalities of gait and mobility (R26.89);Muscle weakness (generalized) (M62.81)    Time: 1350-1409 PT Time Calculation (min) (ACUTE ONLY): 19 min   Charges:   PT Evaluation $PT Eval Moderate Complexity: 1 Mod          Ruchel Brandenburger, PT, GCS 07/07/21,3:18 PM

## 2021-07-08 LAB — CBC WITH DIFFERENTIAL/PLATELET
Abs Immature Granulocytes: 0.03 10*3/uL (ref 0.00–0.07)
Basophils Absolute: 0 10*3/uL (ref 0.0–0.1)
Basophils Relative: 0 %
Eosinophils Absolute: 0.3 10*3/uL (ref 0.0–0.5)
Eosinophils Relative: 4 %
HCT: 30.4 % — ABNORMAL LOW (ref 36.0–46.0)
Hemoglobin: 9.5 g/dL — ABNORMAL LOW (ref 12.0–15.0)
Immature Granulocytes: 0 %
Lymphocytes Relative: 18 %
Lymphs Abs: 1.4 10*3/uL (ref 0.7–4.0)
MCH: 30.2 pg (ref 26.0–34.0)
MCHC: 31.3 g/dL (ref 30.0–36.0)
MCV: 96.5 fL (ref 80.0–100.0)
Monocytes Absolute: 0.7 10*3/uL (ref 0.1–1.0)
Monocytes Relative: 9 %
Neutro Abs: 5.1 10*3/uL (ref 1.7–7.7)
Neutrophils Relative %: 69 %
Platelets: 203 10*3/uL (ref 150–400)
RBC: 3.15 MIL/uL — ABNORMAL LOW (ref 3.87–5.11)
RDW: 18.2 % — ABNORMAL HIGH (ref 11.5–15.5)
WBC: 7.6 10*3/uL (ref 4.0–10.5)
nRBC: 0 % (ref 0.0–0.2)

## 2021-07-08 LAB — BASIC METABOLIC PANEL
Anion gap: 9 (ref 5–15)
BUN: 36 mg/dL — ABNORMAL HIGH (ref 8–23)
CO2: 24 mmol/L (ref 22–32)
Calcium: 8.5 mg/dL — ABNORMAL LOW (ref 8.9–10.3)
Chloride: 103 mmol/L (ref 98–111)
Creatinine, Ser: 2.21 mg/dL — ABNORMAL HIGH (ref 0.44–1.00)
GFR, Estimated: 21 mL/min — ABNORMAL LOW (ref >60)
Glucose, Bld: 71 mg/dL (ref 70–99)
Potassium: 4.1 mmol/L (ref 3.5–5.1)
Sodium: 136 mmol/L (ref 135–145)

## 2021-07-08 LAB — URINE CULTURE: Culture: 2000 — AB

## 2021-07-08 LAB — GLUCOSE, CAPILLARY
Glucose-Capillary: 77 mg/dL (ref 70–99)
Glucose-Capillary: 73 mg/dL (ref 70–99)
Glucose-Capillary: 137 mg/dL — ABNORMAL HIGH (ref 70–99)
Glucose-Capillary: 170 mg/dL — ABNORMAL HIGH (ref 70–99)

## 2021-07-08 LAB — PROCALCITONIN: Procalcitonin: 0.26 ng/mL

## 2021-07-08 MED ORDER — IPRATROPIUM-ALBUTEROL 0.5-2.5 (3) MG/3ML IN SOLN
3.0000 mL | Freq: Four times a day (QID) | RESPIRATORY_TRACT | Status: DC
  Administered 2021-07-08 – 2021-07-09 (×4): 3 mL via RESPIRATORY_TRACT
  Filled 2021-07-08 (×5): qty 3

## 2021-07-08 MED ORDER — ASPIRIN EC 81 MG PO TBEC
81.0000 mg | DELAYED_RELEASE_TABLET | Freq: Every day | ORAL | Status: DC
  Administered 2021-07-08 – 2021-07-15 (×8): 81 mg via ORAL
  Filled 2021-07-08 (×8): qty 1

## 2021-07-08 MED ORDER — METHYLPREDNISOLONE SODIUM SUCC 40 MG IJ SOLR
40.0000 mg | Freq: Two times a day (BID) | INTRAMUSCULAR | Status: AC
  Administered 2021-07-08 – 2021-07-09 (×2): 40 mg via INTRAVENOUS
  Filled 2021-07-08 (×2): qty 1

## 2021-07-08 MED ORDER — AMLODIPINE BESYLATE 5 MG PO TABS: 5.0000 mg | ORAL_TABLET | Freq: Every day | ORAL | Status: DC

## 2021-07-08 MED ORDER — ONDANSETRON HCL 4 MG/2ML IJ SOLN
4.0000 mg | Freq: Four times a day (QID) | INTRAMUSCULAR | Status: DC | PRN
  Administered 2021-07-10: 4 mg via INTRAVENOUS
  Filled 2021-07-08: qty 2

## 2021-07-08 MED ORDER — PREDNISONE 5 MG PO TABS
7.5000 mg | ORAL_TABLET | Freq: Every morning | ORAL | Status: DC
  Administered 2021-07-08 – 2021-07-16 (×9): 7.5 mg via ORAL
  Filled 2021-07-08 (×9): qty 2

## 2021-07-08 MED ORDER — CARVEDILOL 12.5 MG PO TABS
12.5000 mg | ORAL_TABLET | Freq: Two times a day (BID) | ORAL | Status: DC
  Administered 2021-07-08 – 2021-07-16 (×16): 12.5 mg via ORAL
  Filled 2021-07-08 (×16): qty 1

## 2021-07-08 MED ORDER — TORSEMIDE 20 MG PO TABS
20.0000 mg | ORAL_TABLET | Freq: Two times a day (BID) | ORAL | Status: DC
  Administered 2021-07-08: 20 mg via ORAL
  Filled 2021-07-08: qty 1

## 2021-07-08 NOTE — TOC Progression Note (Signed)
Transition of Care [facility]) - Progression Note    Patient Details  Name: [name] [name] MRN: [id number] Date of Birth: [birth year]  [facility]) CM/SW Contact  Terrial Rhodes, LCSWA Phone Number: 07/08/2021, 2:54 PM  Clinical Narrative:     CSW spoke with Soy with [facility] who confirmed insurance Berkley Harvey is not needed for patient to return for SNF placement. CSW will continue to follow and assist with dc planning needs.  Expected Discharge Plan: Skilled Nursing Facility Barriers to Discharge: Continued Medical Work up  Expected Discharge Plan and Services Expected Discharge Plan: Skilled Nursing Facility In-house Referral: Clinical Social Work     Living arrangements for the past 2 months: Skilled Nursing Facility                                       Social Determinants of Health (SDOH) Interventions    Readmission Risk Interventions No flowsheet data found.

## 2021-07-08 NOTE — Progress Notes (Addendum)
[facility] PROGRESS NOTE    Progress Note  [name] [name]  ZOX:[id number] DOB: October 30, 1930 DOA: 07/04/2021 PCP: Pcp, No     Brief Narrative:   [name] [name] is an 87 y.o. female past medical history of chronic combined systolic and diastolic heart failure with an EF of 60%, rheumatoid arthritis, essential hypertension CAD PE currently not on anticoagulation comes into the ED for shortness of breath and acute encephalopathy.  CT angio of the chest was negative for PE CT of the head was unremarkable. BNP of 2000 creatinine mildly elevated from baseline.    Assessment/Plan:   Acute on chronic systolic CHF (congestive heart failure) (HCC): Requiring 1 L oxygen to keep saturations greater than 95%. Resume her home dose of torsemide. On admission she was 97 kg. Continues strict I 'sand O's and daily weights. Potassium has improved with oral repletion. Can be restarted as an outpatient.  Fever and leukocytosis: On admission she had a temperature of 101.2 her white blood cell count is rising now is 14. CT chest showed no acute findings, urine culture showed only 2000 colonies of E. coli she is asymptomatic. Blood cultures have been negative till date Respiratory panel is negative Has remained afebrile her white blood cells trending down off antibiotics CT abdomen pelvis showed no acute findings, procalcitonin is low yield.  CKD stage 3a due to type 2 diabetes mellitus (HCC) With baseline creatinine of 1 on admission 1.2 we will see if is improved with diuresis.  Acute metabolic encephalopathy: Patient is currently afebrile, CT of the head showed no acute findings. MRI of the brain showed no acute findings. She does not appear focal on physical exam.  Uncontrolled hypertension: She was restarted on her home medications, except Norvasc. Blood pressure slightly elevated.  We will continue current regimen.  Rheumatoid arthritis involving both knees [facility]): Continue  current  home regimen. At home she is on steroid she has been on it for several years, we will go ahead and give her Solu-Medrol IV every 12 hr  and then restart her home dose of prednisone.  Normocytic anemia: Likely due to chronic renal disease.  Continue to monitor seems stable from previous admission.  History of CAD, Denies any chest pain.  History of diabetes mellitus type 2: A1c of 5.8 continue sliding scale insulin  Nausea and vomiting likely due to relative adrenal insufficiency: Started on home dose of prednisone tomorrow we will give her IV Solu-Medrol twice today and start her back on her home dose.   DVT prophylaxis: lovenox Family Communication:none Status is: Inpatient  The patient will require care spanning > 2 midnights and should be moved to inpatient because: Hemodynamically unstable  Dispo: The patient is from: Home              Anticipated d/c is to: SNF              Patient currently is not medically stable to d/c.   Difficult to place patient No   Code Status:     Code Status Orders  (From admission, onward)           Start     Ordered   07/05/21 0630  Do not attempt resuscitation (DNR)  Continuous       Question Answer Comment  In the event of cardiac or respiratory ARREST Do not call a "code blue"   In the event of cardiac or respiratory ARREST Do not perform Intubation, CPR, defibrillation or ACLS  In the event of cardiac or respiratory ARREST Use medication by any route, position, wound care, and other measures to relive pain and suffering. May use oxygen, suction and manual treatment of airway obstruction as needed for comfort.      07/05/21 0629           Code Status History     Date Active Date Inactive Code Status Order ID Comments User Context   07/05/2021 0614 07/05/2021 0629 Full Code [id number]  Eduard Clos, MD ED   03/26/2021 1646 03/28/2021 2042 DNR [id number]  Dolan Amen, MD ED   08/27/2020 0746 03/26/2021 1138  Full Code [id number]  Kermit Balo, DO Outpatient   12/13/2019 0331 12/14/2019 0352 Full Code [id number]  Eduard Clos, MD ED   03/29/2019 1717 04/03/2019 2202 Full Code [id number]  Rolly Salter, MD ED   01/09/2019 1701 01/12/2019 1824 Full Code [id number]  Jonah Blue, MD Inpatient   12/13/2018 1523 12/20/2018 2209 Full Code [id number]  Alba Cory, MD Inpatient   11/15/2018 0343 11/17/2018 2355 Full Code [id number]  Eduard Clos, MD ED   10/21/2018 2111 10/25/2018 1458 Full Code [id number]  Kathrynn Running, MD Inpatient   09/05/2018 1932 09/07/2018 2115 Full Code [id number]  John Giovanni, MD ED   07/24/2018 0208 07/27/2018 1929 Full Code [id number]  Briscoe Deutscher, MD ED   06/23/2018 0451 06/27/2018 2316 Full Code [id number]  Eduard Clos, MD Inpatient   05/28/2018 2345 06/04/2018 2356 Full Code [id number]  Hugelmeyer, Hammon, DO Inpatient   02/02/2018 0606 02/04/2018 1957 DNR [id number]  [name], MD ED   11/26/2017 2055 11/29/2017 1715 DNR [id number]  Zigmund Daniel., MD Inpatient   05/25/2017 1811 05/27/2017 1956 DNR [id number]  Leatha Gilding, MD Inpatient   11/29/2016 1522 12/01/2016 1509 Full Code [id number]  Haydee Salter, MD ED   09/11/2015 0454 09/12/2015 1432 Full Code [id number]  Hillary Bow, DO ED   06/19/2015 2338 06/21/2015 1941 Full Code [id number]  Hillary Bow, DO ED   01/25/2014 2245 06/19/2015 2338 Full Code [id number]  Margit Hanks, MD Outpatient   01/17/2014 1305 01/22/2014 2142 Full Code [id number]  Drema Dallas, MD ED         IV Access:   Peripheral IV   Procedures and diagnostic studies:   CT ABDOMEN PELVIS W CONTRAST  Result Date: 07/06/2021 CLINICAL DATA:  Abdominal pain, fever EXAM: CT ABDOMEN AND PELVIS WITH CONTRAST TECHNIQUE: Multidetector CT imaging of the abdomen and pelvis was performed using the standard protocol following bolus administration of intravenous contrast. CONTRAST:  1mL OMNIPAQUE IOHEXOL 350 MG/ML SOLN  COMPARISON:  July 24, 2018, July 04, 2021 FINDINGS: Lower chest: Bibasilar atelectasis. Likely focal air trapping of the peripheral RIGHT middle lobe. Adjacent to this is a pulmonary nodule peripherally which measures 6 mm (series 5, image 14). Cardiomegaly. Three-vessel coronary artery atherosclerotic calcifications. Hepatobiliary: Status post cholecystectomy. Dilation of the common bile duct to 12 mm, stable to decreased in comparison to prior and likely reflecting the sequela of cholecystectomy. No new focal hepatic lesion. Pancreas: Unremarkable. No pancreatic ductal dilatation or surrounding inflammatory changes. Spleen: Normal in size without focal abnormality. Adrenals/Urinary Tract: Adrenal glands are unremarkable. No hydronephrosis. RIGHT renal cyst. No obstructing nephrolithiasis. Excreted contrast within the bladder. Stomach/Bowel: No evidence of bowel obstruction. Diverticulosis. Patient is status post appendectomy. Small hiatal hernia. Vascular/Lymphatic: Severe atherosclerotic calcifications. No suspicious lymphadenopathy. Reproductive: Status post hysterectomy. No adnexal masses. Other: Small  fat containing ventral hernia. Sequela of injection in the LEFT pannus. Musculoskeletal: Femoral head sclerosis may reflect underlying avascular necrosis. Stable lucent lesion with well-defined sclerotic margins of the proximal LEFT femur likely reflecting a benign fibro-osseous lesion. Degenerative changes of the lumbar spine with unchanged mild wedging L1. Unchanged anterolisthesis of L4-5. Transitional anatomy with assimilation joints at S1-2 IMPRESSION: 1. No CT etiology for abdominal pain identified. 2. Nodular opacity in the RIGHT middle lobe measures 6 mm. Non-contrast chest CT at 6-12 months is recommended. If the nodule is stable at time of repeat CT, then future CT at 18-24 months (from today's scan) is considered optional for low-risk patients, but is recommended for high-risk patients. This  recommendation follows the consensus statement: Guidelines for Management of Incidental Pulmonary Nodules Detected on CT Images: From the Fleischner Society 2017; Radiology 2017; 284:228-243. Aortic Atherosclerosis (ICD10-I70.0). Electronically Signed   By: Meda Klinefelter [name]   On: 07/06/2021 16:49     Medical Consultants:   None.   Subjective:    ROGENE KELLY really does not feel well vomited twice this morning anorexic  Objective:    Vitals:   07/08/21 0500 07/08/21 0835 07/08/21 0838 07/08/21 0901  BP:  (!) 148/76 (!) 153/73 (!) 153/73  Pulse:  99 98 98  Resp:  (!) 29 18 18   Temp:  98.3 F (36.8 C) 98.2 F (36.8 C) 98.2 F (36.8 C)  TempSrc:  Oral  Oral  SpO2:  100%  100%  Weight: 97.1 kg     Height:       SpO2: 100 % O2 Flow Rate (L/min): 2 L/min   Intake/Output Summary (Last 24 hours) at 07/08/2021 1035 Last data filed at 07/07/2021 2200 Gross per 24 hour  Intake 252.98 ml  Output 150 ml  Net 102.98 ml    Filed Weights   07/06/21 0213 07/07/21 0300 07/08/21 0500  Weight: 94 kg 95 kg 97.1 kg    Exam: General exam: In no acute distress. Respiratory system: Good air movement and clear to auscultation. Cardiovascular system: S1 & S2 heard, RRR. No JVD. Gastrointestinal system: Abdomen is nondistended, soft and nontender.  Extremities: No pedal edema. Skin: No rashes, lesions or ulcers  Data Reviewed:    Labs: Basic Metabolic Panel: Recent Labs  Lab 07/04/21 1950 07/05/21 0656 07/06/21 0444 07/07/21 0118 07/08/21 0117  NA 137  --  139 139 136  K 3.6  --  2.9* 3.9 4.1  CL 104  --  101 102 103  CO2 25  --  25 24 24   GLUCOSE 96  --  143* 87 71  BUN 33*  --  32* 34* 36*  CREATININE 1.29* 1.18* 1.10* 1.37* 2.21*  CALCIUM 8.7*  --  9.0 8.8* 8.5*  MG  --   --   --  2.0  --     GFR Estimated Creatinine Clearance: 19.5 mL/min (A) (by C-G formula based on SCr of 2.21 mg/dL (H)). Liver Function Tests: Recent Labs  Lab 07/04/21 1950  AST  13*  ALT 13  ALKPHOS 58  BILITOT 0.7  PROT 6.2*  ALBUMIN 3.3*    No results for input(s): LIPASE, AMYLASE in the last 168 hours. No results for input(s): AMMONIA in the last 168 hours. Coagulation profile Recent Labs  Lab 07/05/21 0005  INR 1.2    COVID-19 Labs  No results for input(s): DDIMER, FERRITIN, LDH, CRP in the last 72 hours.  Lab Results  Component Value Date   SARSCOV2NAA  NEGATIVE 07/04/2021   SARSCOV2NAA NEGATIVE 03/26/2021   SARSCOV2NAA NEGATIVE 12/13/2019   SARSCOV2NAA POSITIVE (A) 10/26/2019    CBC: Recent Labs  Lab 07/04/21 1950 07/05/21 0656 07/06/21 0444 07/07/21 0118  WBC 9.0 11.7* 14.0* 10.9*  NEUTROABS 6.7  --   --  8.1*  HGB 10.6* 10.5* 9.8* 9.7*  HCT 33.3* 34.4* 29.8* 30.6*  MCV 98.5 99.7 94.3 95.6  PLT 209 210 225 221    Cardiac Enzymes: No results for input(s): CKTOTAL, CKMB, CKMBINDEX, TROPONINI in the last 168 hours. BNP (last 3 results) No results for input(s): PROBNP in the last 8760 hours. CBG: Recent Labs  Lab 07/07/21 0736 07/07/21 1120 07/07/21 1626 07/07/21 2224 07/08/21 0729  GLUCAP 90 90 79 78 77    D-Dimer: No results for input(s): DDIMER in the last 72 hours. Hgb A1c: No results for input(s): HGBA1C in the last 72 hours. Lipid Profile: No results for input(s): CHOL, HDL, LDLCALC, TRIG, CHOLHDL, LDLDIRECT in the last 72 hours. Thyroid function studies: No results for input(s): TSH, T4TOTAL, T3FREE, THYROIDAB in the last 72 hours.  Invalid input(s): FREET3 Anemia work up: No results for input(s): VITAMINB12, FOLATE, FERRITIN, TIBC, IRON, RETICCTPCT in the last 72 hours. Sepsis Labs: Recent Labs  Lab 07/04/21 1950 07/05/21 0656 07/06/21 0444 07/06/21 1045 07/07/21 0118 07/08/21 0117  PROCALCITON  --   --   --  0.21 0.23 0.26  WBC 9.0 11.7* 14.0*  --  10.9*  --   LATICACIDVEN 1.1  --   --   --   --   --     Microbiology Recent Results (from the past 240 hour(s))  Blood Culture (routine x 2)      Status: None (Preliminary result)   Collection Time: 07/04/21  7:50 PM   Specimen: BLOOD  Result Value Ref Range Status   Specimen Description BLOOD LEFT ARM  Final   Special Requests   Final    BOTTLES DRAWN AEROBIC AND ANAEROBIC Blood Culture results may not be optimal due to an excessive volume of blood received in culture bottles   Culture   Final    NO GROWTH 4 DAYS Performed at [facility], [address], [place], [place] [address]    Report Status PENDING  Incomplete  Resp Panel by RT-PCR (Flu A&B, Covid) Nasopharyngeal Swab     Status: None   Collection Time: 07/04/21  9:16 PM   Specimen: Nasopharyngeal Swab; Nasopharyngeal(NP) swabs in vial transport medium  Result Value Ref Range Status   SARS Coronavirus 2 by RT PCR NEGATIVE NEGATIVE Final    Comment: (NOTE) SARS-CoV-2 target nucleic acids are NOT DETECTED.  The SARS-CoV-2 RNA is generally detectable in upper respiratory specimens during the acute phase of infection. The lowest concentration of SARS-CoV-2 viral copies this assay can detect is 138 copies/mL. A negative result does not preclude SARS-Cov-2 infection and should not be used as the sole basis for treatment or other patient management decisions. A negative result may occur with  improper specimen collection/handling, submission of specimen other than nasopharyngeal swab, presence of viral mutation(s) within the areas targeted by this assay, and inadequate number of viral copies(<138 copies/mL). A negative result must be combined with clinical observations, patient history, and epidemiological information. The expected result is Negative.  Fact Sheet for Patients:  BloggerCourse.com  Fact Sheet for Healthcare Providers:  SeriousBroker.it  This test is no t yet approved or cleared by the [place] and  has been authorized for  detection and/or diagnosis of SARS-CoV-2 by FDA under an Emergency  Use Authorization (EUA). This EUA will remain  in effect (meaning this test can be used) for the duration of the COVID-19 declaration under Section 564(b)(1) of the Act, 21 U.S.C.section 360bbb-3(b)(1), unless the authorization is terminated  or revoked sooner.       Influenza A by PCR NEGATIVE NEGATIVE Final   Influenza B by PCR NEGATIVE NEGATIVE Final    Comment: (NOTE) The Xpert Xpress SARS-CoV-2/FLU/RSV plus assay is intended as an aid in the diagnosis of influenza from Nasopharyngeal swab specimens and should not be used as a sole basis for treatment. Nasal washings and aspirates are unacceptable for Xpert Xpress SARS-CoV-2/FLU/RSV testing.  Fact Sheet for Patients: BloggerCourse.com  Fact Sheet for Healthcare Providers: SeriousBroker.it  This test is not yet approved or cleared by the [place] FDA and has been authorized for detection and/or diagnosis of SARS-CoV-2 by FDA under an Emergency Use Authorization (EUA). This EUA will remain in effect (meaning this test can be used) for the duration of the COVID-19 declaration under Section 564(b)(1) of the Act, 21 U.S.C. section 360bbb-3(b)(1), unless the authorization is terminated or revoked.  Performed at [facility], [address] [place], [place] [address]   Blood Culture (routine x 2)     Status: None (Preliminary result)   Collection Time: 07/05/21 12:06 AM   Specimen: BLOOD  Result Value Ref Range Status   Specimen Description BLOOD RIGHT ARM  Final   Special Requests   Final    BOTTLES DRAWN AEROBIC AND ANAEROBIC Blood Culture adequate volume   Culture   Final    NO GROWTH 3 DAYS Performed at [facility], [address] [place], [place] [address]    Report Status PENDING  Incomplete  Urine Culture     Status: Abnormal   Collection Time: 07/05/21  4:24 AM   Specimen: In/Out Cath Urine  Result Value Ref Range Status   Specimen Description  IN/OUT CATH URINE  Final   Special Requests   Final    NONE Performed at [facility], [address], [place], [place] [address]    Culture (A)  Final    2,000 COLONIES/mL ESCHERICHIA COLI Confirmed Extended Spectrum Beta-Lactamase Producer (ESBL).  In bloodstream infections from ESBL organisms, carbapenems are preferred over piperacillin/tazobactam. They are shown to have a lower risk of mortality. 1,000 COLONIES/mL ENTEROCOCCUS FAECALIS    Report Status 07/08/2021 FINAL  Final   Organism ID, Bacteria ESCHERICHIA COLI (A)  Final   Organism ID, Bacteria ENTEROCOCCUS FAECALIS (A)  Final      Susceptibility   Escherichia coli - MIC*    AMPICILLIN >=32 RESISTANT Resistant     CEFAZOLIN >=64 RESISTANT Resistant     CEFEPIME 2 SENSITIVE Sensitive     CEFTRIAXONE >=64 RESISTANT Resistant     CIPROFLOXACIN >=4 RESISTANT Resistant     GENTAMICIN >=16 RESISTANT Resistant     IMIPENEM <=0.25 SENSITIVE Sensitive     NITROFURANTOIN <=16 SENSITIVE Sensitive     TRIMETH/SULFA <=20 SENSITIVE Sensitive     AMPICILLIN/SULBACTAM >=32 RESISTANT Resistant     PIP/TAZO <=4 SENSITIVE Sensitive     * 2,000 COLONIES/mL ESCHERICHIA COLI   Enterococcus faecalis - MIC*    AMPICILLIN <=2 SENSITIVE Sensitive     NITROFURANTOIN <=16 SENSITIVE Sensitive     VANCOMYCIN 1 SENSITIVE Sensitive     * 1,000 COLONIES/mL ENTEROCOCCUS FAECALIS  Respiratory (~20 pathogens) panel by  PCR     Status: None   Collection Time: 07/06/21 12:20 PM   Specimen: Nasopharyngeal Swab; Respiratory  Result Value Ref Range Status   Adenovirus NOT DETECTED NOT DETECTED Final   Coronavirus 229E NOT DETECTED NOT DETECTED Final    Comment: (NOTE) The Coronavirus on the Respiratory Panel, DOES NOT test for the novel  Coronavirus (2019 nCoV)    Coronavirus HKU1 NOT DETECTED NOT DETECTED Final   Coronavirus NL63 NOT DETECTED NOT DETECTED Final   Coronavirus OC43 NOT DETECTED NOT DETECTED Final   Metapneumovirus NOT DETECTED NOT  DETECTED Final   Rhinovirus / Enterovirus NOT DETECTED NOT DETECTED Final   Influenza A NOT DETECTED NOT DETECTED Final   Influenza B NOT DETECTED NOT DETECTED Final   Parainfluenza Virus 1 NOT DETECTED NOT DETECTED Final   Parainfluenza Virus 2 NOT DETECTED NOT DETECTED Final   Parainfluenza Virus 3 NOT DETECTED NOT DETECTED Final   Parainfluenza Virus 4 NOT DETECTED NOT DETECTED Final   Respiratory Syncytial Virus NOT DETECTED NOT DETECTED Final   Bordetella pertussis NOT DETECTED NOT DETECTED Final   Bordetella Parapertussis NOT DETECTED NOT DETECTED Final   Chlamydophila pneumoniae NOT DETECTED NOT DETECTED Final   Mycoplasma pneumoniae NOT DETECTED NOT DETECTED Final    Comment: Performed at [facility], [address] [place], [place] [address]     Medications:    acidophilus  1 capsule Oral Daily   hydroxychloroquine  200 mg Oral BID   insulin aspart  0-6 Units Subcutaneous TID WC   leflunomide  20 mg Oral QPM   LORazepam  1 mg Intravenous Once   senna-docusate  2 tablet Oral Q breakfast   And   senna-docusate  1 tablet Oral BID   Continuous Infusions:    LOS: 3 days   Marinda Elk  [facility]  07/08/2021, 10:35 AM

## 2021-07-09 ENCOUNTER — Inpatient Hospital Stay (HOSPITAL_COMMUNITY): Payer: Medicare Other

## 2021-07-09 LAB — CULTURE, BLOOD (ROUTINE X 2): Culture: NO GROWTH

## 2021-07-09 LAB — CBC WITH DIFFERENTIAL/PLATELET
Abs Immature Granulocytes: 0.02 10*3/uL (ref 0.00–0.07)
Basophils Absolute: 0 10*3/uL (ref 0.0–0.1)
Basophils Relative: 0 %
Eosinophils Absolute: 0 10*3/uL (ref 0.0–0.5)
Eosinophils Relative: 0 %
HCT: 31.8 % — ABNORMAL LOW (ref 36.0–46.0)
Hemoglobin: 10.1 g/dL — ABNORMAL LOW (ref 12.0–15.0)
Immature Granulocytes: 0 %
Lymphocytes Relative: 8 %
Lymphs Abs: 0.6 10*3/uL — ABNORMAL LOW (ref 0.7–4.0)
MCH: 30.4 pg (ref 26.0–34.0)
MCHC: 31.8 g/dL (ref 30.0–36.0)
MCV: 95.8 fL (ref 80.0–100.0)
Monocytes Absolute: 0.1 10*3/uL (ref 0.1–1.0)
Monocytes Relative: 1 %
Neutro Abs: 6.6 10*3/uL (ref 1.7–7.7)
Neutrophils Relative %: 91 %
Platelets: 243 10*3/uL (ref 150–400)
RBC: 3.32 MIL/uL — ABNORMAL LOW (ref 3.87–5.11)
RDW: 17.9 % — ABNORMAL HIGH (ref 11.5–15.5)
WBC: 7.2 10*3/uL (ref 4.0–10.5)
nRBC: 0 % (ref 0.0–0.2)

## 2021-07-09 LAB — BASIC METABOLIC PANEL
Anion gap: 14 (ref 5–15)
BUN: 45 mg/dL — ABNORMAL HIGH (ref 8–23)
CO2: 19 mmol/L — ABNORMAL LOW (ref 22–32)
Calcium: 8.7 mg/dL — ABNORMAL LOW (ref 8.9–10.3)
Chloride: 99 mmol/L (ref 98–111)
Creatinine, Ser: 3.51 mg/dL — ABNORMAL HIGH (ref 0.44–1.00)
GFR, Estimated: 12 mL/min — ABNORMAL LOW (ref >60)
Glucose, Bld: 176 mg/dL — ABNORMAL HIGH (ref 70–99)
Potassium: 5.3 mmol/L — ABNORMAL HIGH (ref 3.5–5.1)
Sodium: 132 mmol/L — ABNORMAL LOW (ref 135–145)

## 2021-07-09 LAB — GLUCOSE, CAPILLARY
Glucose-Capillary: 185 mg/dL — ABNORMAL HIGH (ref 70–99)
Glucose-Capillary: 172 mg/dL — ABNORMAL HIGH (ref 70–99)
Glucose-Capillary: 194 mg/dL — ABNORMAL HIGH (ref 70–99)
Glucose-Capillary: 198 mg/dL — ABNORMAL HIGH (ref 70–99)

## 2021-07-09 MED ORDER — ENOXAPARIN SODIUM 100 MG/ML IJ SOSY
100.0000 mg | PREFILLED_SYRINGE | Freq: Every day | INTRAMUSCULAR | Status: DC
  Administered 2021-07-09: 100 mg via SUBCUTANEOUS
  Filled 2021-07-09: qty 1

## 2021-07-09 MED ORDER — IPRATROPIUM-ALBUTEROL 0.5-2.5 (3) MG/3ML IN SOLN
3.0000 mL | RESPIRATORY_TRACT | Status: DC | PRN
  Administered 2021-07-11: 3 mL via RESPIRATORY_TRACT
  Filled 2021-07-09: qty 3

## 2021-07-09 MED ORDER — SODIUM ZIRCONIUM CYCLOSILICATE 5 G PO PACK
5.0000 g | PACK | Freq: Once | ORAL | Status: AC
  Administered 2021-07-09: 5 g via ORAL
  Filled 2021-07-09: qty 1

## 2021-07-09 MED ORDER — SODIUM CHLORIDE 0.9 % IV SOLN: INTRAVENOUS | Status: DC

## 2021-07-09 NOTE — Progress Notes (Signed)
Heart Failure Navigator Progress Note  Assessed for Heart & Vascular TOC clinic readiness.  Patient does not meet criteria due to encephalopathy and non-focal neuro exam per Hospitalist. Planned DC to SNF.   Navigator available for reassessment of patient.   Ozella Rocks, MSN, RN Heart Failure Nurse Navigator [phone]

## 2021-07-09 NOTE — Progress Notes (Signed)
ANTICOAGULATION CONSULT NOTE - Initial Consult  Pharmacy Consult for Enoxaparin Indication: DVT  Allergies  Allergen Reactions   Clonidine Derivatives Swelling    [name]   Fish Allergy Diarrhea, Swelling and Other (See Comments)    Turns skin "black," but can tolerate white fish Salmon- Diarrhea   Shellfish Allergy Hives   Doxycycline Rash and Other (See Comments)    Dizziness, also   Cetirizine Hcl Itching   Citrus Diarrhea and Other (See Comments)    Indigestion/heartburn, also; "Allergic to oranges" noted on MAR   Indomethacin Other (See Comments)    "Allergic," per MAR   Methyldopa Diarrhea   Morphine And Related Nausea And Vomiting and Other (See Comments)    "Family reports it drops her b/p that she needs iv fluids"   Morphine Sulfate Nausea And Vomiting and Other (See Comments)    "Family reports it drops her b/p that she needs iv fluids"   Pregabalin Nausea Only and Other (See Comments)    Hallucinations, also   Shellfish-Derived Products Itching and Other (See Comments)    Dizziness, also   Strawberry (Diagnostic) Itching   Strawberry Extract Itching and Other (See Comments)    Dizziness, also   Cetirizine Hcl Itching and Rash   Codeine Hives and Itching   Levaquin [Levofloxacin In D5w] Rash   Levofloxacin Rash   Tomato Rash    Patient Measurements: Height: 5\' 4"  (162.6 cm) Weight: 97.1 kg (214 lb 1.1 oz) IBW/kg (Calculated) : 54.7   Vital Signs: Temp: 97.9 F (36.6 C) (08/31 1520) Temp Source: Oral (08/31 1520) BP: 133/90 (08/31 1520) Pulse Rate: 98 (08/31 1520)  Labs: Recent Labs    07/07/21 0118 07/08/21 0117 07/08/21 1106 07/09/21 0205 07/09/21 0829  HGB 9.7*  --  9.5* 10.1*  --   HCT 30.6*  --  30.4* 31.8*  --   PLT 221  --  203 243  --   CREATININE 1.37* 2.21*  --   --  3.51*    Estimated Creatinine Clearance: 12.3 mL/min (A) (by C-G formula based on SCr of 3.51  mg/dL (H)).   Medical History: Past Medical History:  Diagnosis Date   Allergic rhinitis due to pollen 04/27/2007   Arthritis    "in q joint" (08/07/2013)   CHF (congestive heart failure) (HCC)    Coronary atherosclerosis of native coronary artery    Cough    Edema 05/03/2013   Exertional shortness of breath    "sometimes" (08/07/2013)   First degree atrioventricular block    Gout, unspecified 04/19/2013   Hyperlipidemia 04/27/2007   Hypertension    Midline low back pain without sciatica 06/27/2014   Muscle spasm    Muscle weakness (generalized)    Onychia and paronychia of toe    Osteoarthrosis, unspecified whether generalized or localized, unspecified site 04/27/2007   Other fall    Other malaise and fatigue    Pneumonia 05/2018   Prepatellar bursitis    Pulmonary embolism (HCC) 07/2018   Rheumatic nodule (HCC) 06/28/2017   Seizures (HCC)    Stroke (HCC) 01/17/2014   Type II diabetes mellitus (HCC)    "fasting 90-110s" (08/07/2013)   Unspecified vitamin D deficiency     Medications:  Scheduled:   acidophilus  1 capsule Oral Daily   aspirin EC  81 mg Oral QHS   carvedilol  12.5 mg Oral BID WC   hydroxychloroquine  200 mg Oral BID   insulin aspart  0-6 Units Subcutaneous TID WC   leflunomide  20 mg Oral QPM   LORazepam  1 mg Intravenous Once   predniSONE  7.5 mg Oral q morning   senna-docusate  2 tablet Oral Q breakfast   And   senna-docusate  1 tablet Oral BID   sodium zirconium cyclosilicate  5 g Oral Once   Infusions:   sodium chloride      Assessment: Per chart review, [place] on admission 8/27 showed chronic left femoral vein DVT of unknown age. Will start enoxaparin with plans to transition to Eliquis once renal function improves.   Goal of Therapy:  Anti-Xa level 0.6-1 units/ml 4hrs after LMWH dose given Monitor platelets by anticoagulation protocol: Yes   Plan:  - Start enoxaparin 100mg  Q24H - Consider obtaining anti-Xa level after 4th consecutive dose   - Follow-up with plans to transition to apixaban pending improvement in renal function    Thank you for allowing pharmacy to be a part of this patient's care.  , PharmD Clinical Pharmacist

## 2021-07-09 NOTE — Progress Notes (Addendum)
[facility] PROGRESS NOTE    Progress Note  [name] [name]  FHQ:[id number] DOB: April 19, 1930 DOA: 07/04/2021 PCP: Pcp, No     Brief Narrative:   [name] [name] is an 87 y.o. female past medical history of chronic combined systolic and diastolic heart failure with an EF of 60%, rheumatoid arthritis, essential hypertension CAD PE currently not on anticoagulation comes into the ED for shortness of breath and acute encephalopathy.  CT angio of the chest was negative for PE CT of the head was unremarkable. BNP of 2000 creatinine mildly elevated from baseline.    Assessment/Plan:   Acute on chronic systolic CHF (congestive heart failure) (HCC): -CTA chest was negative for PE on admission, noted to have 2-3+ lower extremity edema, diuresed with IV Lasix, then transition to oral torsemide -Creatinine has bumped up significantly, will hold diuretics, gentle IV fluids today  Acute kidney injury -creatinine bumped from baseline of 1.3 up to 2.2 yesterday and 3.5 today, suspect likely secondary to diuresis and contrast on admission -Hold torsemide, check bladder scan and renal ultrasound -Add gentle IV fluids today -Monitor urine output  Age indeterminant DVT left leg -Dopplers on admission 8/27 noted chronic left femoral vein DVT -Start Lovenox, once kidney function improves will start Eliquis  Fever and leukocytosis: -Resolved, likely viral illness, COVID PCR was negative, CT chest, urine culture unremarkable, blood cultures are negative, respiratory virus panel negative  -Afebrile and leukocytosis has resolved now, remained stable   Acute metabolic encephalopathy: -resolved  Hypertension: -Continue Coreg, amlodipine on hold, hold torsemide today  Rheumatoid arthritis involving both knees (HCC): -Chronic prednisone continue 7.5 mg daily  Normocytic anemia: -Stable, monitor  History of CAD, Denies any chest pain.  History of diabetes mellitus type 2: A1c of 5.8  continue sliding scale insulin   DVT prophylaxis: lovenox CODE STATUS: DNR Family Communication:none Status is: Inpatient  The patient will require care spanning > 2 midnights and should be moved to inpatient because: Hemodynamically unstable  Dispo: The patient is from: Home              Anticipated d/c is to: SNF              Patient currently is not medically stable to d/c.   Difficult to place patient No    Subjective:  Feels okay overall, no specific complaints, breathing is so-so  Objective:    Vitals:   07/09/21 0354 07/09/21 0726 07/09/21 0909 07/09/21 1115  BP: (!) 145/92  (!) 187/90   Pulse: 97  98   Resp: 13     Temp: 98 F (36.7 C)     TempSrc: Oral     SpO2: 99% 100%  99%  Weight: 97.1 kg     Height:       SpO2: 99 % O2 Flow Rate (L/min): 1 L/min   Intake/Output Summary (Last 24 hours) at 07/09/2021 1521 Last data filed at 07/09/2021 0300 Gross per 24 hour  Intake 480 ml  Output 240 ml  Net 240 ml   Filed Weights   07/07/21 0300 07/08/21 0500 07/09/21 0354  Weight: 95 kg 97.1 kg 97.1 kg    Exam: Obese pleasant female sitting up in bed, AAO x2, no distress CVS: S1-S2, regular rate rhythm Lungs: Decreased breath sounds to bases Abdomen: Obese, soft, nontender, bowel sounds present Extremities: No edema Skin: No rash on exposed skin Neuro: Generalized lower extremity weakness, disuse atrophy  Data Reviewed:    Labs: Basic Metabolic Panel: Recent  Labs  Lab 07/04/21 1950 07/05/21 0656 07/06/21 0444 07/07/21 0118 07/08/21 0117 07/09/21 0829  NA 137  --  139 139 136 132*  K 3.6  --  2.9* 3.9 4.1 5.3*  CL 104  --  101 102 103 99  CO2 25  --  19*  GLUCOSE 96  --  143* 87 71 176*  BUN 33*  --  32* 34* 36* 45*  CREATININE 1.29* 1.18* 1.10* 1.37* 2.21* 3.51*  CALCIUM 8.7*  --  9.0 8.8* 8.5* 8.7*  MG  --   --   --  2.0  --   --    GFR Estimated Creatinine Clearance: 12.3 mL/min (A) (by C-G formula based on SCr of 3.51 mg/dL  (H)). Liver Function Tests: Recent Labs  Lab 07/04/21 1950  AST 13*  ALT 13  ALKPHOS 58  BILITOT 0.7  PROT 6.2*  ALBUMIN 3.3*   No results for input(s): LIPASE, AMYLASE in the last 168 hours. No results for input(s): AMMONIA in the last 168 hours. Coagulation profile Recent Labs  Lab 07/05/21 0005  INR 1.2   COVID-19 Labs  No results for input(s): DDIMER, FERRITIN, LDH, CRP in the last 72 hours.  Lab Results  Component Value Date   SARSCOV2NAA NEGATIVE 07/04/2021   SARSCOV2NAA NEGATIVE 03/26/2021   SARSCOV2NAA NEGATIVE 12/13/2019   SARSCOV2NAA POSITIVE (A) 10/26/2019    CBC: Recent Labs  Lab 07/04/21 1950 07/05/21 0656 07/06/21 0444 07/07/21 0118 07/08/21 1106 07/09/21 0205  WBC 9.0 11.7* 14.0* 10.9* 7.6 7.2  NEUTROABS 6.7  --   --  8.1* 5.1 6.6  HGB 10.6* 10.5* 9.8* 9.7* 9.5* 10.1*  HCT 33.3* 34.4* 29.8* 30.6* 30.4* 31.8*  MCV 98.5 99.7 94.3 95.6 96.5 95.8  PLT 209 210 225 221 203 243   Cardiac Enzymes: No results for input(s): CKTOTAL, CKMB, CKMBINDEX, TROPONINI in the last 168 hours. BNP (last 3 results) No results for input(s): PROBNP in the last 8760 hours. CBG: Recent Labs  Lab 07/08/21 1131 07/08/21 1634 07/08/21 2113 07/09/21 0823 07/09/21 1204  GLUCAP 73 137* 170* 185* 172*   D-Dimer: No results for input(s): DDIMER in the last 72 hours. Hgb A1c: No results for input(s): HGBA1C in the last 72 hours. Lipid Profile: No results for input(s): CHOL, HDL, LDLCALC, TRIG, CHOLHDL, LDLDIRECT in the last 72 hours. Thyroid function studies: No results for input(s): TSH, T4TOTAL, T3FREE, THYROIDAB in the last 72 hours.  Invalid input(s): FREET3 Anemia work up: No results for input(s): VITAMINB12, FOLATE, FERRITIN, TIBC, IRON, RETICCTPCT in the last 72 hours. Sepsis Labs: Recent Labs  Lab 07/04/21 1950 07/05/21 0656 07/06/21 0444 07/06/21 1045 07/07/21 0118 07/08/21 0117 07/08/21 1106 07/09/21 0205  PROCALCITON  --   --   --  0.21 0.23  0.26  --   --   WBC 9.0   < > 14.0*  --  10.9*  --  7.6 7.2  LATICACIDVEN 1.1  --   --   --   --   --   --   --    < > = values in this interval not displayed.   Microbiology Recent Results (from the past 240 hour(s))  Blood Culture (routine x 2)     Status: None   Collection Time: 07/04/21  7:50 PM   Specimen: BLOOD  Result Value Ref Range Status   Specimen Description BLOOD LEFT ARM  Final   Special Requests   Final    BOTTLES DRAWN AEROBIC  AND ANAEROBIC Blood Culture results may not be optimal due to an excessive volume of blood received in culture bottles   Culture   Final    NO GROWTH 5 DAYS Performed at [facility], [address], [place], [place] [address]    Report Status 07/09/2021 FINAL  Final  Resp Panel by RT-PCR (Flu A&B, Covid) Nasopharyngeal Swab     Status: None   Collection Time: 07/04/21  9:16 PM   Specimen: Nasopharyngeal Swab; Nasopharyngeal(NP) swabs in vial transport medium  Result Value Ref Range Status   SARS Coronavirus 2 by RT PCR NEGATIVE NEGATIVE Final    Comment: (NOTE) SARS-CoV-2 target nucleic acids are NOT DETECTED.  The SARS-CoV-2 RNA is generally detectable in upper respiratory specimens during the acute phase of infection. The lowest concentration of SARS-CoV-2 viral copies this assay can detect is 138 copies/mL. A negative result does not preclude SARS-Cov-2 infection and should not be used as the sole basis for treatment or other patient management decisions. A negative result may occur with  improper specimen collection/handling, submission of specimen other than nasopharyngeal swab, presence of viral mutation(s) within the areas targeted by this assay, and inadequate number of viral copies(<138 copies/mL). A negative result must be combined with clinical observations, patient history, and epidemiological information. The expected result is Negative.  Fact Sheet for Patients:  BloggerCourse.com  Fact  Sheet for Healthcare Providers:  SeriousBroker.it  This test is no t yet approved or cleared by the [place] FDA and  has been authorized for detection and/or diagnosis of SARS-CoV-2 by FDA under an Emergency Use Authorization (EUA). This EUA will remain  in effect (meaning this test can be used) for the duration of the COVID-19 declaration under Section 564(b)(1) of the Act, 21 U.S.C.section 360bbb-3(b)(1), unless the authorization is terminated  or revoked sooner.       Influenza A by PCR NEGATIVE NEGATIVE Final   Influenza B by PCR NEGATIVE NEGATIVE Final    Comment: (NOTE) The Xpert Xpress SARS-CoV-2/FLU/RSV plus assay is intended as an aid in the diagnosis of influenza from Nasopharyngeal swab specimens and should not be used as a sole basis for treatment. Nasal washings and aspirates are unacceptable for Xpert Xpress SARS-CoV-2/FLU/RSV testing.  Fact Sheet for Patients: BloggerCourse.com  Fact Sheet for Healthcare Providers: SeriousBroker.it  This test is not yet approved or cleared by the [place] FDA and has been authorized for detection and/or diagnosis of SARS-CoV-2 by FDA under an Emergency Use Authorization (EUA). This EUA will remain in effect (meaning this test can be used) for the duration of the COVID-19 declaration under Section 564(b)(1) of the Act, 21 U.S.C. section 360bbb-3(b)(1), unless the authorization is terminated or revoked.  Performed at [facility], [address] [place], [place] [address]   Blood Culture (routine x 2)     Status: None (Preliminary result)   Collection Time: 07/05/21 12:06 AM   Specimen: BLOOD  Result Value Ref Range Status   Specimen Description BLOOD RIGHT ARM  Final   Special Requests   Final    BOTTLES DRAWN AEROBIC AND ANAEROBIC Blood Culture adequate volume   Culture   Final    NO GROWTH 4 DAYS Performed at [facility]  Lab, [address], [place], [place] [address]    Report Status PENDING  Incomplete  Urine Culture     Status: Abnormal   Collection Time: 07/05/21  4:24 AM   Specimen: In/Out Cath Urine  Result  Value Ref Range Status   Specimen Description IN/OUT CATH URINE  Final   Special Requests   Final    NONE Performed at [facility], [address], [place], [place] [address]    Culture (A)  Final    2,000 COLONIES/mL ESCHERICHIA COLI Confirmed Extended Spectrum Beta-Lactamase Producer (ESBL).  In bloodstream infections from ESBL organisms, carbapenems are preferred over piperacillin/tazobactam. They are shown to have a lower risk of mortality. 1,000 COLONIES/mL ENTEROCOCCUS FAECALIS    Report Status 07/08/2021 FINAL  Final   Organism ID, Bacteria ESCHERICHIA COLI (A)  Final   Organism ID, Bacteria ENTEROCOCCUS FAECALIS (A)  Final      Susceptibility   Escherichia coli - MIC*    AMPICILLIN >=32 RESISTANT Resistant     CEFAZOLIN >=64 RESISTANT Resistant     CEFEPIME 2 SENSITIVE Sensitive     CEFTRIAXONE >=64 RESISTANT Resistant     CIPROFLOXACIN >=4 RESISTANT Resistant     GENTAMICIN >=16 RESISTANT Resistant     IMIPENEM <=0.25 SENSITIVE Sensitive     NITROFURANTOIN <=16 SENSITIVE Sensitive     TRIMETH/SULFA <=20 SENSITIVE Sensitive     AMPICILLIN/SULBACTAM >=32 RESISTANT Resistant     PIP/TAZO <=4 SENSITIVE Sensitive     * 2,000 COLONIES/mL ESCHERICHIA COLI   Enterococcus faecalis - MIC*    AMPICILLIN <=2 SENSITIVE Sensitive     NITROFURANTOIN <=16 SENSITIVE Sensitive     VANCOMYCIN 1 SENSITIVE Sensitive     * 1,000 COLONIES/mL ENTEROCOCCUS FAECALIS  Respiratory (~20 pathogens) panel by PCR     Status: None   Collection Time: 07/06/21 12:20 PM   Specimen: Nasopharyngeal Swab; Respiratory  Result Value Ref Range Status   Adenovirus NOT DETECTED NOT DETECTED Final   Coronavirus 229E NOT DETECTED NOT DETECTED Final    Comment: (NOTE) The Coronavirus on the Respiratory Panel, DOES  NOT test for the novel  Coronavirus (2019 nCoV)    Coronavirus HKU1 NOT DETECTED NOT DETECTED Final   Coronavirus NL63 NOT DETECTED NOT DETECTED Final   Coronavirus OC43 NOT DETECTED NOT DETECTED Final   Metapneumovirus NOT DETECTED NOT DETECTED Final   Rhinovirus / Enterovirus NOT DETECTED NOT DETECTED Final   Influenza A NOT DETECTED NOT DETECTED Final   Influenza B NOT DETECTED NOT DETECTED Final   Parainfluenza Virus 1 NOT DETECTED NOT DETECTED Final   Parainfluenza Virus 2 NOT DETECTED NOT DETECTED Final   Parainfluenza Virus 3 NOT DETECTED NOT DETECTED Final   Parainfluenza Virus 4 NOT DETECTED NOT DETECTED Final   Respiratory Syncytial Virus NOT DETECTED NOT DETECTED Final   Bordetella pertussis NOT DETECTED NOT DETECTED Final   Bordetella Parapertussis NOT DETECTED NOT DETECTED Final   Chlamydophila pneumoniae NOT DETECTED NOT DETECTED Final   Mycoplasma pneumoniae NOT DETECTED NOT DETECTED Final    Comment: Performed at [facility], [address] [place], [place] [address]     Medications:    acidophilus  1 capsule Oral Daily   aspirin EC  81 mg Oral QHS   carvedilol  12.5 mg Oral BID WC   hydroxychloroquine  200 mg Oral BID   insulin aspart  0-6 Units Subcutaneous TID WC   leflunomide  20 mg Oral QPM   LORazepam  1 mg Intravenous Once   predniSONE  7.5 mg Oral q morning   senna-docusate  2 tablet Oral Q breakfast   And   senna-docusate  1 tablet Oral BID   Continuous Infusions:    LOS: 4  days   Zannie Cove  [facility]  07/09/2021, 3:21 PM

## 2021-07-10 LAB — CBC
HCT: 29.4 % — ABNORMAL LOW (ref 36.0–46.0)
Hemoglobin: 9.4 g/dL — ABNORMAL LOW (ref 12.0–15.0)
MCH: 30.5 pg (ref 26.0–34.0)
MCHC: 32 g/dL (ref 30.0–36.0)
MCV: 95.5 fL (ref 80.0–100.0)
Platelets: 227 10*3/uL (ref 150–400)
RBC: 3.08 MIL/uL — ABNORMAL LOW (ref 3.87–5.11)
RDW: 17.9 % — ABNORMAL HIGH (ref 11.5–15.5)
WBC: 10.7 10*3/uL — ABNORMAL HIGH (ref 4.0–10.5)
nRBC: 0 % (ref 0.0–0.2)

## 2021-07-10 LAB — BASIC METABOLIC PANEL
Anion gap: 13 (ref 5–15)
BUN: 49 mg/dL — ABNORMAL HIGH (ref 8–23)
CO2: 18 mmol/L — ABNORMAL LOW (ref 22–32)
Calcium: 8.3 mg/dL — ABNORMAL LOW (ref 8.9–10.3)
Chloride: 96 mmol/L — ABNORMAL LOW (ref 98–111)
Creatinine, Ser: 3.7 mg/dL — ABNORMAL HIGH (ref 0.44–1.00)
GFR, Estimated: 11 mL/min — ABNORMAL LOW (ref >60)
Glucose, Bld: 162 mg/dL — ABNORMAL HIGH (ref 70–99)
Potassium: 4.8 mmol/L (ref 3.5–5.1)
Sodium: 127 mmol/L — ABNORMAL LOW (ref 135–145)

## 2021-07-10 LAB — CULTURE, BLOOD (ROUTINE X 2)
Culture: NO GROWTH
Special Requests: ADEQUATE

## 2021-07-10 LAB — GLUCOSE, CAPILLARY
Glucose-Capillary: 146 mg/dL — ABNORMAL HIGH (ref 70–99)
Glucose-Capillary: 151 mg/dL — ABNORMAL HIGH (ref 70–99)
Glucose-Capillary: 139 mg/dL — ABNORMAL HIGH (ref 70–99)
Glucose-Capillary: 155 mg/dL — ABNORMAL HIGH (ref 70–99)

## 2021-07-10 MED ORDER — HEPARIN (PORCINE) 25000 UT/250ML-% IV SOLN
1150.0000 [IU]/h | INTRAVENOUS | Status: DC
  Administered 2021-07-10: 950 [IU]/h via INTRAVENOUS
  Filled 2021-07-10: qty 250

## 2021-07-10 MED ORDER — SODIUM CHLORIDE 0.9 % IV SOLN: INTRAVENOUS | Status: AC

## 2021-07-10 NOTE — Progress Notes (Signed)
ANTICOAGULATION CONSULT NOTE   Pharmacy Consult for Enoxaparin> heparin  Indication: DVT  Allergies  Allergen Reactions   Clonidine Derivatives Swelling    [name]   Fish Allergy Diarrhea, Swelling and Other (See Comments)    Turns skin "black," but can tolerate white fish Salmon- Diarrhea   Shellfish Allergy Hives   Doxycycline Rash and Other (See Comments)    Dizziness, also   Cetirizine Hcl Itching   Citrus Diarrhea and Other (See Comments)    Indigestion/heartburn, also; "Allergic to oranges" noted on MAR   Indomethacin Other (See Comments)    "Allergic," per MAR   Methyldopa Diarrhea   Morphine And Related Nausea And Vomiting and Other (See Comments)    "Family reports it drops her b/p that she needs iv fluids"   Morphine Sulfate Nausea And Vomiting and Other (See Comments)    "Family reports it drops her b/p that she needs iv fluids"   Pregabalin Nausea Only and Other (See Comments)    Hallucinations, also   Shellfish-Derived Products Itching and Other (See Comments)    Dizziness, also   Strawberry (Diagnostic) Itching   Strawberry Extract Itching and Other (See Comments)    Dizziness, also   Cetirizine Hcl Itching and Rash   Codeine Hives and Itching   Levaquin [Levofloxacin In D5w] Rash   Levofloxacin Rash   Tomato Rash    Patient Measurements: Height: 5\' 4"  (162.6 cm) Weight: 96.5 kg (212 lb 11.2 oz) IBW/kg (Calculated) : 54.7   Vital Signs: Temp: 97.7 F (36.5 C) (09/01 0452) Temp Source: Oral (09/01 0452) BP: 144/93 (09/01 0845) Pulse Rate: 81 (09/01 0845)  Labs: Recent Labs    07/08/21 0117 07/08/21 1106 07/08/21 1106 07/09/21 0205 07/09/21 0829 07/10/21 0258  HGB  --  9.5*   < > 10.1*  --  9.4*  HCT  --  30.4*  --  31.8*  --  29.4*  PLT  --  203  --  243  --  227  CREATININE 2.21*  --   --   --  3.51* 3.70*   < > = values in this interval not displayed.      Estimated Creatinine Clearance: 11.6 mL/min (A) (by C-G formula based on SCr of 3.7 mg/dL (H)).   Medical History: Past Medical History:  Diagnosis Date   Allergic rhinitis due to pollen 04/27/2007   Arthritis    "in q joint" (08/07/2013)   CHF (congestive heart failure) (HCC)    Coronary atherosclerosis of native coronary artery    Cough    Edema 05/03/2013   Exertional shortness of breath    "sometimes" (08/07/2013)   First degree atrioventricular block    Gout, unspecified 04/19/2013   Hyperlipidemia 04/27/2007   Hypertension    Midline low back pain without sciatica 06/27/2014   Muscle spasm    Muscle weakness (generalized)    Onychia and paronychia of toe    Osteoarthrosis, unspecified whether generalized or localized, unspecified site 04/27/2007   Other fall    Other malaise and fatigue    Pneumonia 05/2018   Prepatellar bursitis    Pulmonary embolism (HCC) 07/2018   Rheumatic nodule (HCC) 06/28/2017   Seizures (HCC)    Stroke (HCC) 01/17/2014   Type II diabetes mellitus (HCC)    "fasting 90-110s" (08/07/2013)   Unspecified vitamin D deficiency     Medications:  Scheduled:   acidophilus  1 capsule Oral Daily   aspirin  EC  81 mg Oral QHS   carvedilol  12.5 mg Oral BID WC   hydroxychloroquine  200 mg Oral BID   insulin aspart  0-6 Units Subcutaneous TID WC   leflunomide  20 mg Oral QPM   LORazepam  1 mg Intravenous Once   predniSONE  7.5 mg Oral q morning   senna-docusate  2 tablet Oral Q breakfast   And   senna-docusate  1 tablet Oral BID   Infusions:   sodium chloride 75 mL/hr at 07/10/21 1146    Assessment: 82 you female with chronic left femoral vein DVT of unknown age on lovenox and now noted with AKI. Plans are to transition to IV heparin.    -SCr 1.3>> 3.7 -hg= 9.4 -last lovenox dose 100mg  at ~ 10pm on 8/31   Goal of Therapy:  Heparin level= 0.3-0.7 Monitor platelets by anticoagulation protocol: Yes   Plan:  -Start heparin 950 units/hr  at 10pm -Heparin level in 8 hours and daily wth CBC daily  9/31, PharmD Clinical Pharmacist **Pharmacist phone directory can now be found on [email or web address] ([email or web address]).  Listed under [facility].    -

## 2021-07-10 NOTE — TOC Progression Note (Signed)
Transition of Care [facility]) - Progression Note    Patient Details  Name: ADWOA AXE MRN: [id number] Date of Birth: 01-Dec-1929  [facility]) CM/SW Contact  Terrial Rhodes, LCSWA Phone Number: 07/10/2021, 2:17 PM  Clinical Narrative:     Patient has SNF bed at [facility] when medically ready for dc. CSW will continue to follow and assist with dc planning needs.  Expected Discharge Plan: Skilled Nursing Facility Barriers to Discharge: Continued Medical Work up  Expected Discharge Plan and Services Expected Discharge Plan: Skilled Nursing Facility In-house Referral: Clinical Social Work     Living arrangements for the past 2 months: Skilled Nursing Facility                                       Social Determinants of Health (SDOH) Interventions    Readmission Risk Interventions No flowsheet data found.

## 2021-07-10 NOTE — Consult Note (Signed)
Reason for Consult: AKI/CKD stage IIIa Referring Physician: Jomarie Longs, MD  [name] [name] is an 87 y.o. female with a PMH significant for chronic combined systolic and diastolic CHF, RA, HTN, CAD, and h/o PE in the past not on anticoagulation, who presented to [facility] ED on 07/04/21 with 2 day history of SOB, cough, increased LE edema, slurred speech, confusion, and fatigue.  In the ED, she was hypertensive, febrile at 101.2, and ill appearing with significant lower extremity edema.  Labs were notable for BNP of 2000, Scr 1.29, Hgb 10.6, and covid test was negative.  She had a CT angio which was negative for PE and was admitted for IV diuresis for acute on chronic CHF.  She developed abdominal pain and persistent fever so a CT of abdomen and pelvis with IV contrast was ordered on 07/06/21.  Since then her Scr has continued to climb and her UOP has decreased.  We were consulted to further evaluate and manage her AKI/CKD stage IIIa.  The trend in Scr is seen below.      Of note, Dr. Jomarie [name] spoke with [name] [name] and her daughter, and they are in agreement not to pursue renal replacement therapy.   Trend in Creatinine: Creatinine, Ser  Date/Time Value Ref Range Status  07/10/2021 02:58 AM 3.70 (H) 0.44 - 1.00 mg/dL Final  16/08/9603 54:09 AM 3.51 (H) 0.44 - 1.00 mg/dL Final  81/19/1478 29:56 AM 2.21 (H) 0.44 - 1.00 mg/dL Final  21/30/8657 84:69 AM 1.37 (H) 0.44 - 1.00 mg/dL Final  62/95/2841 32:44 AM 1.10 (H) 0.44 - 1.00 mg/dL Final  11/11/7251 66:44 AM 1.18 (H) 0.44 - 1.00 mg/dL Final  03/47/4259 56:38 PM 1.29 (H) 0.44 - 1.00 mg/dL Final  75/64/3329 51:88 AM 0.88 0.44 - 1.00 mg/dL Final  41/66/0630 16:01 AM 1.07 (H) 0.44 - 1.00 mg/dL Final  09/32/3557 32:20 PM 1.09 (H) 0.44 - 1.00 mg/dL Final  25/42/7062 37:62 PM 1.10 (H) 0.44 - 1.00 mg/dL Final  83/15/1761 60:73 PM 0.85 0.40 - 1.20 mg/dL Final  71/04/2693 85:46 PM 1.16 (H) 0.44 - 1.00 mg/dL Final  27/01/5008 38:18 AM 1.26 (H) 0.44 - 1.00 mg/dL Final   29/93/7169 67:89 PM 1.53 (H) 0.44 - 1.00 mg/dL Final  38/08/1750 02:58 PM 1.05 (H) 0.44 - 1.00 mg/dL Final  52/77/8242 35:36 PM 1.16 (H) 0.44 - 1.00 mg/dL Final  14/43/1540 08:67 AM 1.00 0.44 - 1.00 mg/dL Final  61/95/0932 67:12 PM 0.86 0.57 - 1.00 mg/dL Final  45/80/9983 38:25 AM 0.75 0.44 - 1.00 mg/dL Final  05/39/7673 41:93 PM 0.81 0.44 - 1.00 mg/dL Final  79/12/4095 35:32 PM 0.82 0.44 - 1.00 mg/dL Final  99/24/2683 41:96 AM 0.89 0.44 - 1.00 mg/dL Final  22/29/7989 21:19 AM 0.84 0.44 - 1.00 mg/dL Final  41/74/0814 48:18 AM 0.82 0.44 - 1.00 mg/dL Final  56/31/4970 26:37 PM 0.81 0.44 - 1.00 mg/dL Final  85/88/5027 74:12 PM 0.90 0.44 - 1.00 mg/dL Final  87/86/7672 09:47 AM 1.17 (H) 0.44 - 1.00 mg/dL Final  09/62/8366 29:47 AM 1.15 (H) 0.44 - 1.00 mg/dL Final  65/46/5035 46:56 AM 1.07 (H) 0.44 - 1.00 mg/dL Final  81/27/5170 01:74 AM 0.99 0.44 - 1.00 mg/dL Final  94/49/6759 16:38 AM 1.10 (H) 0.44 - 1.00 mg/dL Final  46/65/9935 70:17 AM 1.13 (H) 0.44 - 1.00 mg/dL Final  79/39/0300 92:33 AM 1.12 (H) 0.44 - 1.00 mg/dL Final  00/76/2263 33:54 AM 1.22 (H) 0.44 - 1.00 mg/dL Final  56/25/6389 37:34 AM 1.00 0.44 - 1.00 mg/dL  Final  12/13/2018 08:45 AM 1.15 (H) 0.44 - 1.00 mg/dL Final  95/18/8416 60:63 AM 0.98 0.44 - 1.00 mg/dL Final  01/60/1093 23:55 AM 1.17 (H) 0.44 - 1.00 mg/dL Final  73/22/0254 27:06 PM 1.16 (H) 0.44 - 1.00 mg/dL Final  23/76/2831 51:76 AM 1.01 (H) 0.44 - 1.00 mg/dL Final  16/05/3709 62:69 PM 0.90 0.44 - 1.00 mg/dL Final  48/54/6270 35:00 AM 0.86 0.44 - 1.00 mg/dL Final  93/81/8299 37:16 AM 1.01 (H) 0.44 - 1.00 mg/dL Final  96/78/9381 01:75 PM 1.24 (H) 0.40 - 1.20 mg/dL Final  09/02/8526 78:24 PM 1.00 0.44 - 1.00 mg/dL Final  23/53/6144 31:54 PM 0.97 0.40 - 1.20 mg/dL Final  00/86/7619 50:93 AM 1.00 0.44 - 1.00 mg/dL Final  26/71/2458 09:98 AM 0.97 0.44 - 1.00 mg/dL Final  33/82/5053 97:67 PM 1.20 (H) 0.44 - 1.00 mg/dL Final  34/19/3790 24:09 PM 1.22 (H) 0.44 - 1.00  mg/dL Final  73/53/2992 42:68 AM 1.10 (H) 0.44 - 1.00 mg/dL Final    PMH:   Past Medical History:  Diagnosis Date   Allergic rhinitis due to pollen 04/27/2007   Arthritis    "in q joint" (08/07/2013)   CHF (congestive heart failure) (HCC)    Coronary atherosclerosis of native coronary artery    Cough    Edema 05/03/2013   Exertional shortness of breath    "sometimes" (08/07/2013)   First degree atrioventricular block    Gout, unspecified 04/19/2013   Hyperlipidemia 04/27/2007   Hypertension    Midline low back pain without sciatica 06/27/2014   Muscle spasm    Muscle weakness (generalized)    Onychia and paronychia of toe    Osteoarthrosis, unspecified whether generalized or localized, unspecified site 04/27/2007   Other fall    Other malaise and fatigue    Pneumonia 05/2018   Prepatellar bursitis    Pulmonary embolism (HCC) 07/2018   Rheumatic nodule (HCC) 06/28/2017   Seizures (HCC)    Stroke (HCC) 01/17/2014   Type II diabetes mellitus (HCC)    "fasting 90-110s" (08/07/2013)   Unspecified vitamin D deficiency     PSH:   Past Surgical History:  Procedure Laterality Date   ABDOMINAL HYSTERECTOMY  04/1980   APPENDECTOMY  1960   CARDIAC CATHETERIZATION  2003   CATARACT EXTRACTION W/ INTRAOCULAR LENS  IMPLANT, BILATERAL Bilateral ~ 2012   CHOLECYSTECTOMY N/A 06/26/2018   Procedure: LAPAROSCOPIC CHOLECYSTECTOMY POSSIBLE INTRAOPERATIVE CHOLANGIOGRAM;  Surgeon: Abigail Miyamoto, MD;  Location: [facility] OR;  Service: General;  Laterality: N/A;   JOINT REPLACEMENT     REPLACEMENT TOTAL KNEE Right 09/2005   RIGHT/LEFT HEART CATH AND CORONARY ANGIOGRAPHY N/A 06/01/2018   Procedure: RIGHT/LEFT HEART CATH AND CORONARY ANGIOGRAPHY;  Surgeon: Corky Crafts, MD;  Location: [facility] INVASIVE CV LAB;  Service: Cardiovascular;  Laterality: N/A;   SHOULDER OPEN ROTATOR CUFF REPAIR Right 07/1999   TONSILLECTOMY  08/1974   TOTAL KNEE ARTHROPLASTY Left 08/07/2013   Procedure: TOTAL KNEE  ARTHROPLASTY- LEFT;  Surgeon: Dannielle Huh, MD;  Location: [facility] OR;  Service: Orthopedics;  Laterality: Left;   TRANSTHORACIC ECHOCARDIOGRAM  2003   EF 55-65%; mild concentric LVH    Allergies:  Allergies  Allergen Reactions   Clonidine Derivatives Swelling    [name]   Fish Allergy Diarrhea, Swelling and Other (See Comments)    Turns skin "black," but can tolerate white fish Salmon- Diarrhea   Shellfish Allergy Hives   Doxycycline Rash and Other (See Comments)  Dizziness, also   Cetirizine Hcl Itching   Citrus Diarrhea and Other (See Comments)    Indigestion/heartburn, also; "Allergic to oranges" noted on MAR   Indomethacin Other (See Comments)    "Allergic," per MAR   Methyldopa Diarrhea   Morphine And Related Nausea And Vomiting and Other (See Comments)    "Family reports it drops her b/p that she needs iv fluids"   Morphine Sulfate Nausea And Vomiting and Other (See Comments)    "Family reports it drops her b/p that she needs iv fluids"   Pregabalin Nausea Only and Other (See Comments)    Hallucinations, also   Shellfish-Derived Products Itching and Other (See Comments)    Dizziness, also   Strawberry (Diagnostic) Itching   Strawberry Extract Itching and Other (See Comments)    Dizziness, also   Cetirizine Hcl Itching and Rash   Codeine Hives and Itching   Levaquin [Levofloxacin In D5w] Rash   Levofloxacin Rash   Tomato Rash    Medications:   Prior to Admission medications   Medication Sig Start Date End Date Taking? Authorizing Provider  acetaminophen (TYLENOL) 500 MG tablet Take 500 mg by mouth 4 (four) times daily.   Yes [provider]  allopurinol (ZYLOPRIM) 100 MG tablet Take 200 mg by mouth every morning.   Yes [provider]  aspirin EC 81 MG tablet Take 81 mg by mouth at bedtime. Swallow whole.   Yes [provider]  carvedilol (COREG) 12.5 MG tablet Take 12.5  mg by mouth 2 (two) times daily with a meal.   Yes [provider]  Cholecalciferol (D3-50) 1.25 MG (50000 UT) capsule Take 50,000 Units by mouth every 30 (thirty) days. On or about the 19th of each month   Yes [provider]  ferrous sulfate 325 (65 FE) MG tablet Take 325 mg by mouth every morning.   Yes [provider]  fluticasone (FLONASE) 50 MCG/ACT nasal spray Place 1 spray into both nostrils in the morning and at bedtime.   Yes [provider]  gabapentin (NEURONTIN) 100 MG capsule Take 100-200 mg by mouth See admin instructions. Take one capsule (100 mg) by mouth twice daily - 8am and 4pm, take two capsules (200 mg) at bedtime   Yes [provider]  hydroxychloroquine (PLAQUENIL) 200 MG tablet Take 1 tablet (200 mg total) by mouth 2 (two) times daily. 12/20/18  Yes Roberto Scales D, MD  ipratropium-albuterol (DUONEB) 0.5-2.5 (3) MG/3ML SOLN Take 3 mLs by nebulization 4 (four) times daily.   Yes [provider]  Lactobacillus (ACIDOPHILUS) CAPS capsule Take 1 capsule by mouth every morning.   Yes [provider]  leflunomide (ARAVA) 20 MG tablet Take 20 mg by mouth every evening.   Yes [provider]  loratadine (CLARITIN) 10 MG tablet Take 10 mg by mouth 2 (two) times daily.   Yes [provider]  Menthol, Topical Analgesic, (BIOFREEZE) 4 % GEL Apply 1 application topically 4 (four) times daily. Bilateral shoulders and knees   Yes [provider]  Olopatadine HCl 0.2 % SOLN INSTILL 1 DROP IN BOTH EYES DAILY Patient taking differently: Place 1 drop into both eyes every morning. 02/19/20  Yes Cirigliano, Mary K, DO  potassium chloride SA (KLOR-CON) 20 MEQ tablet Take 20 mEq by mouth every morning.   Yes [provider]  predniSONE (DELTASONE) 5 MG tablet Take 7.5 mg by mouth every morning.   Yes [provider]  senna-docusate (SENOKOT-S) 8.6-50 MG tablet  Take 1-2 tablets by mouth See  admin instructions. Take 1 tablet by mouth twice daily - 9am and 5pm, take 2 tablets at bedtime   Yes [provider]  simethicone (MYLICON) 80 MG chewable tablet Chew 80 mg by mouth 4 (four) times daily.   Yes [provider]  torsemide (DEMADEX) 20 MG tablet Take 20 mg by mouth 2 (two) times daily.   Yes [provider]  amLODipine (NORVASC) 2.5 MG tablet Take 1 tablet (2.5 mg total) by mouth as needed. For SBP >180 Patient not taking: No sig reported 03/28/21 03/28/22  Quincy Simmonds, MD  amLODipine (NORVASC) 5 MG tablet Take 1 tablet (5 mg total) by mouth daily. Patient not taking: No sig reported 03/28/21 03/28/22  Quincy Simmonds, MD  Lactobacillus Acid-Pectin (ACIDOPHILUS/PECTIN) CAPS Take 1 capsule by mouth daily. Patient not taking: Reported on 07/05/2021    [provider]    Inpatient medications:  acidophilus  1 capsule Oral Daily   aspirin EC  81 mg Oral QHS   carvedilol  12.5 mg Oral BID WC   hydroxychloroquine  200 mg Oral BID   insulin aspart  0-6 Units Subcutaneous TID WC   leflunomide  20 mg Oral QPM   LORazepam  1 mg Intravenous Once   predniSONE  7.5 mg Oral q morning   senna-docusate  2 tablet Oral Q breakfast   And   senna-docusate  1 tablet Oral BID    Discontinued Meds:   Medications Discontinued During This Encounter  Medication Reason   lactated ringers infusion    ceFEPIme (MAXIPIME) 2 g in sodium chloride 0.9 % 100 mL IVPB    vancomycin (VANCOREADY) IVPB 1250 mg/250 mL    senna-docusate (Senokot-S) tablet 1-2 tablet    furosemide (LASIX) injection 40 mg    furosemide (LASIX) injection 80 mg    allopurinol (ZYLOPRIM) 100 MG tablet Duplicate   albuterol (VENTOLIN HFA) 108 (90 Base) MCG/ACT inhaler Change in therapy   Omega 3-6-9 Fatty Acids (OMEGA 3-6-9 COMPLEX) CAPS Discontinued by provider   Vitamin D, Ergocalciferol, (DRISDOL) 1.25 MG (50000 UNIT) CAPS capsule Change in therapy   furosemide (LASIX) injection 80 mg     furosemide (LASIX) injection 40 mg    enoxaparin (LOVENOX) injection 40 mg    amLODipine (NORVASC) tablet 5 mg    torsemide (DEMADEX) tablet 20 mg    ipratropium-albuterol (DUONEB) 0.5-2.5 (3) MG/3ML nebulizer solution 3 mL    0.9 %  sodium chloride infusion    enoxaparin (LOVENOX) injection 100 mg     Social History:  reports that she has never smoked. She has never used smokeless tobacco. She reports that she does not drink alcohol and does not use drugs.  Family History:   Family History  Problem Relation Age of Onset   Heart attack Father     Pertinent items are noted in HPI. Weight change: -0.62 kg  Intake/Output Summary (Last 24 hours) at 07/10/2021 1416 Last data filed at 07/10/2021 0421 Gross per 24 hour  Intake 954.28 ml  Output --  Net 954.28 ml   BP (!) 144/93   Pulse 81   Temp 97.7 F (36.5 C) (Oral)   Resp 18   Ht  (1.626 m)   Wt 96.5 kg   SpO2 100%   BMI 36.51 kg/m  Vitals:   07/09/21 1824 07/09/21 1956 07/10/21 0452 07/10/21 0845  BP: 120/85 137/83 140/88 (!) 144/93  Pulse: 90 87 82 81  Resp:  17  18   Temp:  97.7 F (36.5 C) 97.7 F (36.5 C)   TempSrc:  Oral Oral   SpO2:  100% 100%   Weight:   96.5 kg   Height:         General appearance: fatigued, no distress, and slowed mentation Head: Normocephalic, without obvious abnormality, atraumatic Eyes: negative findings: lids and lashes normal, conjunctivae and sclerae normal, and corneas clear Resp: clear to auscultation bilaterally Cardio: regular rate and rhythm, S1, S2 normal, no murmur, click, rub or gallop GI: soft, non-tender; bowel sounds normal; no masses,  no organomegaly Extremities: edema 1+ bilateral lower extremity edema and 2+ presacral edema  Labs: Basic Metabolic Panel: Recent Labs  Lab 07/04/21 1950 07/05/21 0656 07/06/21 0444 07/07/21 0118 07/08/21 0117 07/09/21 0829 07/10/21 0258  NA 137  --  139 139 136 132* 127*  K 3.6  --  2.9* 3.9 4.1 5.3* 4.8  CL 104  --  101  102 103 99 96*  CO2 25  --  25 24 24  19* 18*  GLUCOSE 96  --  143* 87 71 176* 162*  BUN 33*  --  32* 34* 36* 45* 49*  CREATININE 1.29* 1.18* 1.10* 1.37* 2.21* 3.51* 3.70*  ALBUMIN 3.3*  --   --   --   --   --   --   CALCIUM 8.7*  --  9.0 8.8* 8.5* 8.7* 8.3*   Liver Function Tests: Recent Labs  Lab 07/04/21 1950  AST 13*  ALT 13  ALKPHOS 58  BILITOT 0.7  PROT 6.2*  ALBUMIN 3.3*   No results for input(s): LIPASE, AMYLASE in the last 168 hours. No results for input(s): AMMONIA in the last 168 hours. CBC: Recent Labs  Lab 07/04/21 1950 07/05/21 0656 07/07/21 0118 07/08/21 1106 07/09/21 0205 07/10/21 0258  WBC 9.0   < > 10.9* 7.6 7.2 10.7*  NEUTROABS 6.7  --  8.1* 5.1 6.6  --   HGB 10.6*   < > 9.7* 9.5* 10.1* 9.4*  HCT 33.3*   < > 30.6* 30.4* 31.8* 29.4*  MCV 98.5   < > 95.6 96.5 95.8 95.5  PLT 209   < > 221 203 243 227   < > = values in this interval not displayed.   PT/INR: @LABRCNTIP (inr:5) Cardiac Enzymes: )No results for input(s): CKTOTAL, CKMB, CKMBINDEX, TROPONINI in the last 168 hours. CBG: Recent Labs  Lab 07/09/21 1204 07/09/21 1558 07/09/21 2120 07/10/21 0824 07/10/21 1126  GLUCAP 172* 194* 198* 146* 151*    Iron Studies: No results for input(s): IRON, TIBC, TRANSFERRIN, FERRITIN in the last 168 hours.  Xrays/Other Studies: 09/09/21 RENAL  Result Date: 07/09/2021 CLINICAL DATA:  Acute kidney injury. EXAM: RENAL / URINARY TRACT ULTRASOUND COMPLETE COMPARISON:  None. FINDINGS: Right Kidney: Renal measurements: 8.6 cm x 4.1 cm x 4.4 cm = volume: 90.99 mL. Echogenicity within normal limits. A 5.2 cm x 4.0 cm x 3.9 cm anechoic structure is seen within the upper pole of the right kidney. No abnormal flow is noted within this region on color Doppler evaluation. No hydronephrosis is visualized. Left Kidney: Renal measurements: 10.5 cm x 4.9 cm x 4.7 cm = volume: 125.11 mL. Echogenicity within normal limits. No mass or hydronephrosis visualized. Bladder: The urinary  bladder is empty and subsequently limited in evaluation. Other: None. IMPRESSION: Large simple right renal cyst, likely benign. No additional follow-up is recommended. Electronically Signed   By: [place] [name]   On: 07/09/2021 19:17  Assessment/Plan:  AKI/CKD stage IIIa - likely combination of contrast induced nephropathy as well as cardiorenal syndrome with diuresis.  Torsemide on hold.  UA negative for blood or protein.  No leukocytes.  Renal US without obstruction.  Currently on IVF's and lasix on hold.  I spoke with she and her daughter and they confirmed that she is not interested in RRT and if her renal function does not start to improve, will need to consult palliative care and consider transition to hospice.   Agree with stopping IVF's tomorrow morning. Will likely need IV lasix again even if Scr continues to climb due to volume overload. Avoid any further nephrotoxic agents such as phosphorus containing bowel preps (FLEETS), IV contrast, NSAIDs/Cox II I's, avoid baclofen.   Renal dose meds for GFR <10 ml/min If she does not start to improve over the next 24 hours, would recommend palliative care consult.  Acute on chronic CHF - initially responded to IV lasix but now with AKI and decreased UOP since lasix stopped.  Would recommend resuming IV lasix tomorrow despite rising Scr due to her marked volume overload.   Left leg DVT - started on lovenox per primary Hypervolemic hyponatremia - due to CHF and AKI.  Resume lasix tomorrow and follow.  Fever and leukocytosis - CT of abd and pelvis without abnormality.  Urine and blood cultures negative to date as well as respiratory panel.  Appears to have resolved. Acute metabolic encephalopathy - improved per daughter.  HTN Rheumatoid arthritis - on chronic prednisone. Anemia of CKD stage IIIa - hold off on ESA given CHF. Right middle lobe nodular opacity seen on CT scan - to f/u with noncontrasted CT in 6 months.   Julien Nordmann  Giovanna Kemmerer 07/10/2021, 2:16 PM

## 2021-07-10 NOTE — Progress Notes (Signed)
[facility] PROGRESS NOTE    Progress Note  [name] [name]  VOZ:[id number] DOB: 1930-02-20 DOA: 07/04/2021 PCP: Pcp, No     Brief Narrative:   [name] [name] is an 87 y.o. female past medical history of chronic combined systolic and diastolic heart failure with an EF of 60%, rheumatoid arthritis, essential hypertension CAD PE currently not on anticoagulation comes into the ED for shortness of breath and acute encephalopathy.  CT angio of the chest was negative for PE  -Diuresed this admission -Hospital course complicated by severe AKI-suspected contrast nephropathy   Assessment/Plan:   Acute on chronic systolic CHF (congestive heart failure) (HCC): -CTA chest was negative for PE on admission, noted to have 2-3+ lower extremity edema initially, diuresed with IV Lasix earlier this admission, then transition to oral torsemide -Creatinine has bumped up significantly, diuretics held since yesterday, continue IV fluids today  Acute kidney injury -creatinine bumped from baseline of 1.3 up to 2.2 ->3.5 yesterday, 3.7 today, suspect this is contrast-induced nephropathy, got IV contrast with CTA in the ED on 8/26 midnight and 8/28 for CT abdomen -Diuretics held since yesterday, urine output is poor as well, renal ultrasound without hydronephrosis  -Will request nephrology input -I called and updated patient's [name], daughter understands and agrees that [name] [name] is not a dialysis candidate should her kidney function not improve, daughter tells me that she is followed by palliative care and they would be accepting of hospice services if kidney function does not improve  Age indeterminant DVT left leg -Dopplers on admission 8/27 noted chronic left femoral vein DVT -Started Lovenox yesterday , once kidney function improves will start Eliquis  Fever and leukocytosis: -Resolved, likely viral illness, COVID PCR was negative, CT chest, urine culture unremarkable, blood cultures are  negative, respiratory virus panel negative  -Afebrile and leukocytosis has resolved now  Acute metabolic encephalopathy: -resolved  Hypertension: -Continue Coreg, amlodipine on hold, hold torsemide   Rheumatoid arthritis involving both knees (HCC): -Chronic prednisone continue 7.5 mg daily  Normocytic anemia: -Stable, monitor  History of CAD, Denies any chest pain.  History of diabetes mellitus type 2: A1c of 5.8 continue sliding scale insulin   DVT prophylaxis: lovenox CODE STATUS: DNR Family Communication: No family at bedside, called and updated [name] Status is: Inpatient  The patient will require care spanning > 2 midnights and should be moved to inpatient because: Hemodynamically unstable  Dispo: The patient is from: Home              Anticipated d/c is to: SNF              Patient currently is not medically stable to d/c.   Difficult to place patient No    Subjective:  Feels okay overall, no specific complaints, breathing is so-so  Objective:    Vitals:   07/09/21 1824 07/09/21 1956 07/10/21 0452 07/10/21 0845  BP: 120/85 137/83 140/88 (!) 144/93  Pulse: 90 87 82 81  Resp:  17 18   Temp:  97.7 F (36.5 C) 97.7 F (36.5 C)   TempSrc:  Oral Oral   SpO2:  100% 100%   Weight:   96.5 kg   Height:       SpO2: 100 % O2 Flow Rate (L/min): 1 L/min   Intake/Output Summary (Last 24 hours) at 07/10/2021 1102 Last data filed at 07/10/2021 0421 Gross per 24 hour  Intake 954.28 ml  Output --  Net 954.28 ml   [employer]  07/08/21 0500 07/09/21 0354 07/10/21 0452  Weight: 97.1 kg 97.1 kg 96.5 kg    Exam: Obese pleasant female sitting up in bed, AAO x2, no distress CVS: S1-S2, regular rate rhythm Lungs: Decreased breath sounds to bases Abdomen: Obese, soft, nontender, bowel sounds present Extremities: No edema Skin: No rash on exposed skin Neuro: Generalized lower extremity weakness, disuse atrophy  Data Reviewed:     Labs: Basic Metabolic Panel: Recent Labs  Lab 07/06/21 0444 07/07/21 0118 07/08/21 0117 07/09/21 0829 07/10/21 0258  NA 139 139 136 132* 127*  K 2.9* 3.9 4.1 5.3* 4.8  CL 101 102 103 99 96*  CO2 25 24 24  19* 18*  GLUCOSE 143* 87 71 176* 162*  BUN 32* 34* 36* 45* 49*  CREATININE 1.10* 1.37* 2.21* 3.51* 3.70*  CALCIUM 9.0 8.8* 8.5* 8.7* 8.3*  MG  --  2.0  --   --   --    GFR Estimated Creatinine Clearance: 11.6 mL/min (A) (by C-G formula based on SCr of 3.7 mg/dL (H)). Liver Function Tests: Recent Labs  Lab 07/04/21 1950  AST 13*  ALT 13  ALKPHOS 58  BILITOT 0.7  PROT 6.2*  ALBUMIN 3.3*   No results for input(s): LIPASE, AMYLASE in the last 168 hours. No results for input(s): AMMONIA in the last 168 hours. Coagulation profile Recent Labs  Lab 07/05/21 0005  INR 1.2   COVID-19 Labs  No results for input(s): DDIMER, FERRITIN, LDH, CRP in the last 72 hours.  Lab Results  Component Value Date   SARSCOV2NAA NEGATIVE 07/04/2021   SARSCOV2NAA NEGATIVE 03/26/2021   SARSCOV2NAA NEGATIVE 12/13/2019   SARSCOV2NAA POSITIVE (A) 10/26/2019    CBC: Recent Labs  Lab 07/04/21 1950 07/05/21 0656 07/06/21 0444 07/07/21 0118 07/08/21 1106 07/09/21 0205 07/10/21 0258  WBC 9.0   < > 14.0* 10.9* 7.6 7.2 10.7*  NEUTROABS 6.7  --   --  8.1* 5.1 6.6  --   HGB 10.6*   < > 9.8* 9.7* 9.5* 10.1* 9.4*  HCT 33.3*   < > 29.8* 30.6* 30.4* 31.8* 29.4*  MCV 98.5   < > 94.3 95.6 96.5 95.8 95.5  PLT 209   < > 225 221 203 243 227   < > = values in this interval not displayed.   Cardiac Enzymes: No results for input(s): CKTOTAL, CKMB, CKMBINDEX, TROPONINI in the last 168 hours. BNP (last 3 results) No results for input(s): PROBNP in the last 8760 hours. CBG: Recent Labs  Lab 07/09/21 0823 07/09/21 1204 07/09/21 1558 07/09/21 2120 07/10/21 0824  GLUCAP 185* 172* 194* 198* 146*   D-Dimer: No results for input(s): DDIMER in the last 72 hours. Hgb A1c: No results for  input(s): HGBA1C in the last 72 hours. Lipid Profile: No results for input(s): CHOL, HDL, LDLCALC, TRIG, CHOLHDL, LDLDIRECT in the last 72 hours. Thyroid function studies: No results for input(s): TSH, T4TOTAL, T3FREE, THYROIDAB in the last 72 hours.  Invalid input(s): FREET3 Anemia work up: No results for input(s): VITAMINB12, FOLATE, FERRITIN, TIBC, IRON, RETICCTPCT in the last 72 hours. Sepsis Labs: Recent Labs  Lab 07/04/21 1950 07/05/21 0656 07/06/21 1045 07/07/21 0118 07/08/21 0117 07/08/21 1106 07/09/21 0205 07/10/21 0258  PROCALCITON  --   --  0.21 0.23 0.26  --   --   --   WBC 9.0   < >  --  10.9*  --  7.6 7.2 10.7*  LATICACIDVEN 1.1  --   --   --   --   --   --   --    < > =  values in this interval not displayed.   Microbiology Recent Results (from the past 240 hour(s))  Blood Culture (routine x 2)     Status: None   Collection Time: 07/04/21  7:50 PM   Specimen: BLOOD  Result Value Ref Range Status   Specimen Description BLOOD LEFT ARM  Final   Special Requests   Final    BOTTLES DRAWN AEROBIC AND ANAEROBIC Blood Culture results may not be optimal due to an excessive volume of blood received in culture bottles   Culture   Final    NO GROWTH 5 DAYS Performed at [facility], [address], [place], [place] [address]    Report Status 07/09/2021 FINAL  Final  Resp Panel by RT-PCR (Flu A&B, Covid) Nasopharyngeal Swab     Status: None   Collection Time: 07/04/21  9:16 PM   Specimen: Nasopharyngeal Swab; Nasopharyngeal(NP) swabs in vial transport medium  Result Value Ref Range Status   SARS Coronavirus 2 by RT PCR NEGATIVE NEGATIVE Final    Comment: (NOTE) SARS-CoV-2 target nucleic acids are NOT DETECTED.  The SARS-CoV-2 RNA is generally detectable in upper respiratory specimens during the acute phase of infection. The lowest concentration of SARS-CoV-2 viral copies this assay can detect is 138 copies/mL. A negative result does not preclude  SARS-Cov-2 infection and should not be used as the sole basis for treatment or other patient management decisions. A negative result may occur with  improper specimen collection/handling, submission of specimen other than nasopharyngeal swab, presence of viral mutation(s) within the areas targeted by this assay, and inadequate number of viral copies(<138 copies/mL). A negative result must be combined with clinical observations, patient history, and epidemiological information. The expected result is Negative.  Fact Sheet for Patients:  BloggerCourse.com  Fact Sheet for Healthcare Providers:  SeriousBroker.it  This test is no t yet approved or cleared by the [place] FDA and  has been authorized for detection and/or diagnosis of SARS-CoV-2 by FDA under an Emergency Use Authorization (EUA). This EUA will remain  in effect (meaning this test can be used) for the duration of the COVID-19 declaration under Section 564(b)(1) of the Act, 21 U.S.C.section 360bbb-3(b)(1), unless the authorization is terminated  or revoked sooner.       Influenza A by PCR NEGATIVE NEGATIVE Final   Influenza B by PCR NEGATIVE NEGATIVE Final    Comment: (NOTE) The Xpert Xpress SARS-CoV-2/FLU/RSV plus assay is intended as an aid in the diagnosis of influenza from Nasopharyngeal swab specimens and should not be used as a sole basis for treatment. Nasal washings and aspirates are unacceptable for Xpert Xpress SARS-CoV-2/FLU/RSV testing.  Fact Sheet for Patients: BloggerCourse.com  Fact Sheet for Healthcare Providers: SeriousBroker.it  This test is not yet approved or cleared by the [place] FDA and has been authorized for detection and/or diagnosis of SARS-CoV-2 by FDA under an Emergency Use Authorization (EUA). This EUA will remain in effect (meaning this test can be used) for the duration of  the COVID-19 declaration under Section 564(b)(1) of the Act, 21 U.S.C. section 360bbb-3(b)(1), unless the authorization is terminated or revoked.  Performed at [facility], [address] [place], [place] [address]   Blood Culture (routine x 2)     Status: None   Collection Time: 07/05/21 12:06 AM   Specimen: BLOOD  Result Value Ref Range Status   Specimen Description BLOOD RIGHT ARM  Final   Special Requests   Final    BOTTLES DRAWN  AEROBIC AND ANAEROBIC Blood Culture adequate volume   Culture   Final    NO GROWTH 5 DAYS Performed at [facility], [address], [place], [place] [address]    Report Status 07/10/2021 FINAL  Final  Urine Culture     Status: Abnormal   Collection Time: 07/05/21  4:24 AM   Specimen: In/Out Cath Urine  Result Value Ref Range Status   Specimen Description IN/OUT CATH URINE  Final   Special Requests   Final    NONE Performed at [facility], [address], [place], [place] [address]    Culture (A)  Final    2,000 COLONIES/mL ESCHERICHIA COLI Confirmed Extended Spectrum Beta-Lactamase Producer (ESBL).  In bloodstream infections from ESBL organisms, carbapenems are preferred over piperacillin/tazobactam. They are shown to have a lower risk of mortality. 1,000 COLONIES/mL ENTEROCOCCUS FAECALIS    Report Status 07/08/2021 FINAL  Final   Organism ID, Bacteria ESCHERICHIA COLI (A)  Final   Organism ID, Bacteria ENTEROCOCCUS FAECALIS (A)  Final      Susceptibility   Escherichia coli - MIC*    AMPICILLIN >=32 RESISTANT Resistant     CEFAZOLIN >=64 RESISTANT Resistant     CEFEPIME 2 SENSITIVE Sensitive     CEFTRIAXONE >=64 RESISTANT Resistant     CIPROFLOXACIN >=4 RESISTANT Resistant     GENTAMICIN >=16 RESISTANT Resistant     IMIPENEM <=0.25 SENSITIVE Sensitive     NITROFURANTOIN <=16 SENSITIVE Sensitive     TRIMETH/SULFA <=20 SENSITIVE Sensitive     AMPICILLIN/SULBACTAM >=32 RESISTANT Resistant     PIP/TAZO <=4 SENSITIVE  Sensitive     * 2,000 COLONIES/mL ESCHERICHIA COLI   Enterococcus faecalis - MIC*    AMPICILLIN <=2 SENSITIVE Sensitive     NITROFURANTOIN <=16 SENSITIVE Sensitive     VANCOMYCIN 1 SENSITIVE Sensitive     * 1,000 COLONIES/mL ENTEROCOCCUS FAECALIS  Respiratory (~20 pathogens) panel by PCR     Status: None   Collection Time: 07/06/21 12:20 PM   Specimen: Nasopharyngeal Swab; Respiratory  Result Value Ref Range Status   Adenovirus NOT DETECTED NOT DETECTED Final   Coronavirus 229E NOT DETECTED NOT DETECTED Final    Comment: (NOTE) The Coronavirus on the Respiratory Panel, DOES NOT test for the novel  Coronavirus (2019 nCoV)    Coronavirus HKU1 NOT DETECTED NOT DETECTED Final   Coronavirus NL63 NOT DETECTED NOT DETECTED Final   Coronavirus OC43 NOT DETECTED NOT DETECTED Final   Metapneumovirus NOT DETECTED NOT DETECTED Final   Rhinovirus / Enterovirus NOT DETECTED NOT DETECTED Final   Influenza A NOT DETECTED NOT DETECTED Final   Influenza B NOT DETECTED NOT DETECTED Final   Parainfluenza Virus 1 NOT DETECTED NOT DETECTED Final   Parainfluenza Virus 2 NOT DETECTED NOT DETECTED Final   Parainfluenza Virus 3 NOT DETECTED NOT DETECTED Final   Parainfluenza Virus 4 NOT DETECTED NOT DETECTED Final   Respiratory Syncytial Virus NOT DETECTED NOT DETECTED Final   Bordetella pertussis NOT DETECTED NOT DETECTED Final   Bordetella Parapertussis NOT DETECTED NOT DETECTED Final   Chlamydophila pneumoniae NOT DETECTED NOT DETECTED Final   Mycoplasma pneumoniae NOT DETECTED NOT DETECTED Final    Comment: Performed at [facility], [address] [place], [place] [address]     Medications:    acidophilus  1 capsule Oral Daily   aspirin EC  81 mg Oral QHS   carvedilol  12.5 mg Oral BID WC   enoxaparin (LOVENOX) injection  100 mg Subcutaneous Q2000   hydroxychloroquine  200 mg Oral BID   insulin aspart  0-6 Units Subcutaneous TID WC   leflunomide  20 mg Oral QPM   LORazepam  1 mg  Intravenous Once   predniSONE  7.5 mg Oral q morning   senna-docusate  2 tablet Oral Q breakfast   And   senna-docusate  1 tablet Oral BID   Continuous Infusions:  sodium chloride        LOS: 5 days   Zannie Cove  [facility]  07/10/2021, 11:02 AM

## 2021-07-10 NOTE — Care Management Important Message (Signed)
Important Message  Patient Details  Name: [name] [name] MRN: [id number] Date of Birth: 1930-09-25   Medicare Important Message Given:  Yes     Renie Ora 07/10/2021, 9:24 AM

## 2021-07-11 LAB — GLUCOSE, CAPILLARY
Glucose-Capillary: 91 mg/dL (ref 70–99)
Glucose-Capillary: 94 mg/dL (ref 70–99)
Glucose-Capillary: 113 mg/dL — ABNORMAL HIGH (ref 70–99)
Glucose-Capillary: 110 mg/dL — ABNORMAL HIGH (ref 70–99)

## 2021-07-11 LAB — CBC
HCT: 28.8 % — ABNORMAL LOW (ref 36.0–46.0)
Hemoglobin: 9.3 g/dL — ABNORMAL LOW (ref 12.0–15.0)
MCH: 30.4 pg (ref 26.0–34.0)
MCHC: 32.3 g/dL (ref 30.0–36.0)
MCV: 94.1 fL (ref 80.0–100.0)
Platelets: 224 10*3/uL (ref 150–400)
RBC: 3.06 MIL/uL — ABNORMAL LOW (ref 3.87–5.11)
RDW: 17.7 % — ABNORMAL HIGH (ref 11.5–15.5)
WBC: 10.2 10*3/uL (ref 4.0–10.5)
nRBC: 0.2 % (ref 0.0–0.2)

## 2021-07-11 LAB — BASIC METABOLIC PANEL
Anion gap: 13 (ref 5–15)
BUN: 54 mg/dL — ABNORMAL HIGH (ref 8–23)
CO2: 17 mmol/L — ABNORMAL LOW (ref 22–32)
Calcium: 8 mg/dL — ABNORMAL LOW (ref 8.9–10.3)
Chloride: 96 mmol/L — ABNORMAL LOW (ref 98–111)
Creatinine, Ser: 4.3 mg/dL — ABNORMAL HIGH (ref 0.44–1.00)
GFR, Estimated: 9 mL/min — ABNORMAL LOW (ref >60)
Glucose, Bld: 82 mg/dL (ref 70–99)
Potassium: 4.7 mmol/L (ref 3.5–5.1)
Sodium: 126 mmol/L — ABNORMAL LOW (ref 135–145)

## 2021-07-11 LAB — HEPARIN LEVEL (UNFRACTIONATED)
Heparin Unfractionated: 0.19 IU/mL — ABNORMAL LOW (ref 0.30–0.70)
Heparin Unfractionated: 0.2 IU/mL — ABNORMAL LOW (ref 0.30–0.70)

## 2021-07-11 MED ORDER — HEPARIN (PORCINE) 25000 UT/250ML-% IV SOLN
1650.0000 [IU]/h | INTRAVENOUS | Status: DC
  Administered 2021-07-12: 1300 [IU]/h via INTRAVENOUS
  Administered 2021-07-12: 1400 [IU]/h via INTRAVENOUS
  Administered 2021-07-13: 1500 [IU]/h via INTRAVENOUS
  Filled 2021-07-11 (×3): qty 250

## 2021-07-11 NOTE — Progress Notes (Signed)
ANTICOAGULATION CONSULT NOTE   Pharmacy Consult for Enoxaparin> heparin  Indication: DVT  Allergies  Allergen Reactions   Clonidine Derivatives Swelling    [name]   Fish Allergy Diarrhea, Swelling and Other (See Comments)    Turns skin "black," but can tolerate white fish Salmon- Diarrhea   Shellfish Allergy Hives   Doxycycline Rash and Other (See Comments)    Dizziness, also   Cetirizine Hcl Itching   Citrus Diarrhea and Other (See Comments)    Indigestion/heartburn, also; "Allergic to oranges" noted on MAR   Indomethacin Other (See Comments)    "Allergic," per MAR   Methyldopa Diarrhea   Morphine And Related Nausea And Vomiting and Other (See Comments)    "Family reports it drops her b/p that she needs iv fluids"   Morphine Sulfate Nausea And Vomiting and Other (See Comments)    "Family reports it drops her b/p that she needs iv fluids"   Pregabalin Nausea Only and Other (See Comments)    Hallucinations, also   Shellfish-Derived Products Itching and Other (See Comments)    Dizziness, also   Strawberry (Diagnostic) Itching   Strawberry Extract Itching and Other (See Comments)    Dizziness, also   Cetirizine Hcl Itching and Rash   Codeine Hives and Itching   Levaquin [Levofloxacin In D5w] Rash   Levofloxacin Rash   Tomato Rash    Patient Measurements: Height: 5\' 4"  (162.6 cm) Weight: 96.5 kg (212 lb 11.2 oz) IBW/kg (Calculated) : 54.7   Vital Signs: Temp: 98 F (36.7 C) (09/02 1138) Temp Source: Oral (09/02 1138) BP: 132/85 (09/02 1138) Pulse Rate: 74 (09/02 1138)  Labs: Recent Labs    07/09/21 0205 07/09/21 0829 07/10/21 0258 07/11/21 0602 07/11/21 1822  HGB 10.1*  --  9.4* 9.3*  --   HCT 31.8*  --  29.4* 28.8*  --   PLT 243  --  227 224  --   HEPARINUNFRC  --   --   --  0.19* 0.20*  CREATININE  --  3.51* 3.70* 4.30*  --      Estimated Creatinine Clearance: 10 mL/min (A) (by  C-G formula based on SCr of 4.3 mg/dL (H)).   Medical History: Past Medical History:  Diagnosis Date   Allergic rhinitis due to pollen 04/27/2007   Arthritis    "in q joint" (08/07/2013)   CHF (congestive heart failure) (HCC)    Coronary atherosclerosis of native coronary artery    Cough    Edema 05/03/2013   Exertional shortness of breath    "sometimes" (08/07/2013)   First degree atrioventricular block    Gout, unspecified 04/19/2013   Hyperlipidemia 04/27/2007   Hypertension    Midline low back pain without sciatica 06/27/2014   Muscle spasm    Muscle weakness (generalized)    Onychia and paronychia of toe    Osteoarthrosis, unspecified whether generalized or localized, unspecified site 04/27/2007   Other fall    Other malaise and fatigue    Pneumonia 05/2018   Prepatellar bursitis    Pulmonary embolism (HCC) 07/2018   Rheumatic nodule (HCC) 06/28/2017   Seizures (HCC)    Stroke (HCC) 01/17/2014   Type II diabetes mellitus (HCC)    "fasting 90-110s" (08/07/2013)   Unspecified vitamin D deficiency     Medications:  Scheduled:   acidophilus  1 capsule Oral Daily   aspirin EC  81 mg Oral QHS   carvedilol  12.5 mg Oral  BID WC   hydroxychloroquine  200 mg Oral BID   insulin aspart  0-6 Units Subcutaneous TID WC   leflunomide  20 mg Oral QPM   predniSONE  7.5 mg Oral q morning   senna-docusate  2 tablet Oral Q breakfast   And   senna-docusate  1 tablet Oral BID   Infusions:   heparin 1,150 Units/hr (07/11/21 1050)    Assessment: 53 you female with chronic left femoral vein DVT of unknown age on lovenox and now noted with AKI. Plans are to transition to IV heparin.    Heparin level came back subtherapeutic at 0.2, on 1150 units/hr. CBC stable. No s/sx of bleeding or infusion issues.   Goal of Therapy:  Heparin level= 0.3-0.7 Monitor platelets by anticoagulation protocol: Yes   Plan:  -Increase heparin to 1300 units/hr -Heparin level in 8 hours and daily with  CBC daily  Sherron Monday, PharmD, BCCCP Clinical Pharmacist  Phone: [phone] 07/11/2021 7:48 PM  Please check AMION for all [facility] Pharmacy phone numbers After 10:00 PM, call Main Pharmacy [phone]

## 2021-07-11 NOTE — TOC Progression Note (Signed)
Transition of Care [facility]) - Progression Note    Patient Details  Name: [name] [name] MRN: [id number] Date of Birth: 1929/12/21  [facility] CM/SW Contact  Terrial Rhodes, LCSWA Phone Number: 07/11/2021, 1:28 PM  Clinical Narrative:     [facility] from [facility]. CSW continues to follow and assist with dc planning needs  Expected Discharge Plan: Skilled Nursing Facility Barriers to Discharge: Continued Medical Work up  Expected Discharge Plan and Services Expected Discharge Plan: Skilled Nursing Facility In-house Referral: Clinical Social Work     Living arrangements for the past 2 months: Skilled Nursing Facility                                       Social Determinants of Health (SDOH) Interventions    Readmission Risk Interventions No flowsheet data found.

## 2021-07-11 NOTE — Progress Notes (Signed)
ANTICOAGULATION CONSULT NOTE   Pharmacy Consult for Enoxaparin> heparin  Indication: DVT  Allergies  Allergen Reactions   Clonidine Derivatives Swelling    [name]   Fish Allergy Diarrhea, Swelling and Other (See Comments)    Turns skin "black," but can tolerate white fish Salmon- Diarrhea   Shellfish Allergy Hives   Doxycycline Rash and Other (See Comments)    Dizziness, also   Cetirizine Hcl Itching   Citrus Diarrhea and Other (See Comments)    Indigestion/heartburn, also; "Allergic to oranges" noted on MAR   Indomethacin Other (See Comments)    "Allergic," per MAR   Methyldopa Diarrhea   Morphine And Related Nausea And Vomiting and Other (See Comments)    "Family reports it drops her b/p that she needs iv fluids"   Morphine Sulfate Nausea And Vomiting and Other (See Comments)    "Family reports it drops her b/p that she needs iv fluids"   Pregabalin Nausea Only and Other (See Comments)    Hallucinations, also   Shellfish-Derived Products Itching and Other (See Comments)    Dizziness, also   Strawberry (Diagnostic) Itching   Strawberry Extract Itching and Other (See Comments)    Dizziness, also   Cetirizine Hcl Itching and Rash   Codeine Hives and Itching   Levaquin [Levofloxacin In D5w] Rash   Levofloxacin Rash   Tomato Rash    Patient Measurements: Height: 5\' 4"  (162.6 cm) Weight: 96.5 kg (212 lb 11.2 oz) IBW/kg (Calculated) : 54.7   Vital Signs: Temp: 97.5 F (36.4 C) (09/02 0536) Temp Source: Oral (09/02 0536) BP: 153/90 (09/02 0536) Pulse Rate: 75 (09/02 0536)  Labs: Recent Labs    07/09/21 0205 07/09/21 0829 07/10/21 0258 07/11/21 0602  HGB 10.1*  --  9.4* 9.3*  HCT 31.8*  --  29.4* 28.8*  PLT 243  --  227 224  HEPARINUNFRC  --   --   --  0.19*  CREATININE  --  3.51* 3.70* 4.30*     Estimated Creatinine Clearance: 10 mL/min (A) (by C-G formula based on SCr of 4.3 mg/dL  (H)).   Medical History: Past Medical History:  Diagnosis Date   Allergic rhinitis due to pollen 04/27/2007   Arthritis    "in q joint" (08/07/2013)   CHF (congestive heart failure) (HCC)    Coronary atherosclerosis of native coronary artery    Cough    Edema 05/03/2013   Exertional shortness of breath    "sometimes" (08/07/2013)   First degree atrioventricular block    Gout, unspecified 04/19/2013   Hyperlipidemia 04/27/2007   Hypertension    Midline low back pain without sciatica 06/27/2014   Muscle spasm    Muscle weakness (generalized)    Onychia and paronychia of toe    Osteoarthrosis, unspecified whether generalized or localized, unspecified site 04/27/2007   Other fall    Other malaise and fatigue    Pneumonia 05/2018   Prepatellar bursitis    Pulmonary embolism (HCC) 07/2018   Rheumatic nodule (HCC) 06/28/2017   Seizures (HCC)    Stroke (HCC) 01/17/2014   Type II diabetes mellitus (HCC)    "fasting 90-110s" (08/07/2013)   Unspecified vitamin D deficiency     Medications:  Scheduled:   acidophilus  1 capsule Oral Daily   aspirin EC  81 mg Oral QHS   carvedilol  12.5 mg Oral BID WC   hydroxychloroquine  200 mg Oral BID   insulin aspart  0-6 Units Subcutaneous TID WC   leflunomide  20 mg Oral QPM   predniSONE  7.5 mg Oral q morning   senna-docusate  2 tablet Oral Q breakfast   And   senna-docusate  1 tablet Oral BID   Infusions:   sodium chloride 75 mL/hr at 07/10/21 1146   heparin 950 Units/hr (07/10/21 2254)    Assessment: 46 you female with chronic left femoral vein DVT of unknown age on lovenox and now noted with AKI. Plans are to transition to IV heparin.    -SCr 1.3>> 4.3 -hg= 9.3 -last lovenox dose 100mg  at ~ 10pm on 8/31   Goal of Therapy:  Heparin level= 0.3-0.7 Monitor platelets by anticoagulation protocol: Yes   Plan:  --Increase heparin to 1150 units/he -Heparin level in 8 hours and daily wth CBC daily   9/31, PharmD Clinical  Pharmacist **Pharmacist phone directory can now be found on [email or web address] ([email or web address]).  Listed under [facility].    -

## 2021-07-11 NOTE — Progress Notes (Signed)
[facility] PROGRESS NOTE    Progress Note  [name] [name]  YQI:[id number] DOB: 02-09-1930 DOA: 07/04/2021 PCP: Pcp, No     Brief Narrative:   [name] [name] is an 87 y.o. female past medical history of chronic combined systolic and diastolic heart failure with an EF of 60%, rheumatoid arthritis, essential hypertension CAD PE currently not on anticoagulation comes into the ED for shortness of breath and acute encephalopathy.  CT angio of the chest was negative for PE  -Diuresed this admission -Hospital course complicated by severe AKI-suspected contrast nephropathy   Assessment/Plan:   Acute kidney injury -creatinine bumped from baseline of 1.3 up to 2.2 ->3.5>3.7>4.3 today -suspect this is contrast-induced nephropathy, got IV contrast with CTA in the ED on 8/26 midnight and 8/28 for CT abdomen -Diuretics held for 3days,  urine output is poor as well, renal ultrasound without hydronephrosis  -Appreciate Nephrology input, prognosis is guarded in the setting of worsening renal function, poor urine output, discussed with [name] yesterday and today, she understands and accepts that he still is not a dialysis candidate, they are agreeable to hospice/comfort focused care if kidney function does not improve -Discontinue IV fluids today  Acute on chronic systolic CHF (congestive heart failure) (HCC): -CTA chest was negative for PE on admission, noted to have 2-3+ lower extremity edema initially, diuresed with IV Lasix earlier this admission, then transitioned to oral torsemide -Creatinine has bumped up significantly, diuretics held since yesterday, continue IV fluids today  Age indeterminant DVT left leg -Dopplers on admission 8/27 noted chronic left femoral vein DVT -Now on IV heparin in the setting of worsening AKI  Fever and leukocytosis: -Resolved, likely viral illness, COVID PCR was negative, CT chest, urine culture unremarkable, blood cultures are negative,  respiratory virus panel negative  -Afebrile and leukocytosis has resolved now  Acute metabolic encephalopathy: -resolved  Hypertension: -Continue Coreg, amlodipine on hold, hold torsemide   Rheumatoid arthritis involving both knees (HCC): -Chronic prednisone continue 7.5 mg daily  Normocytic anemia: -Stable, monitor  History of CAD, Denies any chest pain.  History of diabetes mellitus type 2: A1c of 5.8 continue sliding scale insulin   DVT prophylaxis: Heparin CODE STATUS: DNR Family Communication: Discussed with [name] at bedside Status is: Inpatient inpatient because: Hemodynamically unstable  Dispo: The patient is from: Home              Anticipated d/c is to: SNF              Patient currently is not medically stable to d/c.   Difficult to place patient No    Subjective:  -Feels nauseated, poor p.o. intake, denies any dyspnea  Objective:    Vitals:   07/10/21 2058 07/11/21 0536 07/11/21 0806 07/11/21 1138  BP: (!) 143/93 (!) 153/90  132/85  Pulse: 79 75  74  Resp: Temp: 97.7 F (36.5 C) (!) 97.5 F (36.4 C)  98 F (36.7 C)  TempSrc: Oral Oral  Oral  SpO2: 100% 100% 100% 100%  Weight:      Height:       SpO2: 100 % O2 Flow Rate (L/min): 1 L/min   Intake/Output Summary (Last 24 hours) at 07/11/2021 1337 Last data filed at 07/10/2021 1855 Gross per 24 hour  Intake 524.04 ml  Output --  Net 524.04 ml   Filed Weights   07/08/21 0500 07/09/21 0354 07/10/21 0452  Weight: 97.1 kg 97.1 kg 96.5 kg  Exam: Obese pleasant female sitting up in bed, AAO x2, no distress, uncomfortable appearing CVS: S1-S2, regular rate rhythm Lungs: Decreased breath sounds at both bases Abdomen: Soft, obese, nontender, bowel sounds present Extremities: No edema Skin: No rashes on exposed skin  Neuro: Generalized lower extremity weakness, disuse atrophy  Data Reviewed:    Labs: Basic Metabolic Panel: Recent Labs  Lab 07/07/21 0118  07/08/21 0117 07/09/21 0829 07/10/21 0258 07/11/21 0602  NA 139 136 132* 127* 126*  K 3.9 4.1 5.3* 4.8 4.7  CL 102 103 99 96* 96*  CO2 24 24 19* 18* 17*  GLUCOSE 87 71 176* 162* 82  BUN 34* 36* 45* 49* 54*  CREATININE 1.37* 2.21* 3.51* 3.70* 4.30*  CALCIUM 8.8* 8.5* 8.7* 8.3* 8.0*  MG 2.0  --   --   --   --    GFR Estimated Creatinine Clearance: 10 mL/min (A) (by C-G formula based on SCr of 4.3 mg/dL (H)). Liver Function Tests: Recent Labs  Lab 07/04/21 1950  AST 13*  ALT 13  ALKPHOS 58  BILITOT 0.7  PROT 6.2*  ALBUMIN 3.3*   No results for input(s): LIPASE, AMYLASE in the last 168 hours. No results for input(s): AMMONIA in the last 168 hours. Coagulation profile Recent Labs  Lab 07/05/21 0005  INR 1.2   COVID-19 Labs  No results for input(s): DDIMER, FERRITIN, LDH, CRP in the last 72 hours.  Lab Results  Component Value Date   SARSCOV2NAA NEGATIVE 07/04/2021   SARSCOV2NAA NEGATIVE 03/26/2021   SARSCOV2NAA NEGATIVE 12/13/2019   SARSCOV2NAA POSITIVE (A) 10/26/2019    CBC: Recent Labs  Lab 07/04/21 1950 07/05/21 0656 07/07/21 0118 07/08/21 1106 07/09/21 0205 07/10/21 0258 07/11/21 0602  WBC 9.0   < > 10.9* 7.6 7.2 10.7* 10.2  NEUTROABS 6.7  --  8.1* 5.1 6.6  --   --   HGB 10.6*   < > 9.7* 9.5* 10.1* 9.4* 9.3*  HCT 33.3*   < > 30.6* 30.4* 31.8* 29.4* 28.8*  MCV 98.5   < > 95.6 96.5 95.8 95.5 94.1  PLT 209   < > 221 203 243 227 224   < > = values in this interval not displayed.   Cardiac Enzymes: No results for input(s): CKTOTAL, CKMB, CKMBINDEX, TROPONINI in the last 168 hours. BNP (last 3 results) No results for input(s): PROBNP in the last 8760 hours. CBG: Recent Labs  Lab 07/10/21 1126 07/10/21 1717 07/10/21 2129 07/11/21 0738 07/11/21 1137  GLUCAP 151* 139* 155* 91 94   D-Dimer: No results for input(s): DDIMER in the last 72 hours. Hgb A1c: No results for input(s): HGBA1C in the last 72 hours. Lipid Profile: No results for  input(s): CHOL, HDL, LDLCALC, TRIG, CHOLHDL, LDLDIRECT in the last 72 hours. Thyroid function studies: No results for input(s): TSH, T4TOTAL, T3FREE, THYROIDAB in the last 72 hours.  Invalid input(s): FREET3 Anemia work up: No results for input(s): VITAMINB12, FOLATE, FERRITIN, TIBC, IRON, RETICCTPCT in the last 72 hours. Sepsis Labs: Recent Labs  Lab 07/04/21 1950 07/05/21 0656 07/06/21 1045 07/07/21 0118 07/08/21 0117 07/08/21 1106 07/09/21 0205 07/10/21 0258 07/11/21 0602  PROCALCITON  --   --  0.21 0.23 0.26  --   --   --   --   WBC 9.0   < >  --  10.9*  --  7.6 7.2 10.7* 10.2  LATICACIDVEN 1.1  --   --   --   --   --   --   --   --    < > =  values in this interval not displayed.   Microbiology Recent Results (from the past 240 hour(s))  Blood Culture (routine x 2)     Status: None   Collection Time: 07/04/21  7:50 PM   Specimen: BLOOD  Result Value Ref Range Status   Specimen Description BLOOD LEFT ARM  Final   Special Requests   Final    BOTTLES DRAWN AEROBIC AND ANAEROBIC Blood Culture results may not be optimal due to an excessive volume of blood received in culture bottles   Culture   Final    NO GROWTH 5 DAYS Performed at [facility], [address], [place], [place] [address]    Report Status 07/09/2021 FINAL  Final  Resp Panel by RT-PCR (Flu A&B, Covid) Nasopharyngeal Swab     Status: None   Collection Time: 07/04/21  9:16 PM   Specimen: Nasopharyngeal Swab; Nasopharyngeal(NP) swabs in vial transport medium  Result Value Ref Range Status   SARS Coronavirus 2 by RT PCR NEGATIVE NEGATIVE Final    Comment: (NOTE) SARS-CoV-2 target nucleic acids are NOT DETECTED.  The SARS-CoV-2 RNA is generally detectable in upper respiratory specimens during the acute phase of infection. The lowest concentration of SARS-CoV-2 viral copies this assay can detect is 138 copies/mL. A negative result does not preclude SARS-Cov-2 infection and should not be used as the  sole basis for treatment or other patient management decisions. A negative result may occur with  improper specimen collection/handling, submission of specimen other than nasopharyngeal swab, presence of viral mutation(s) within the areas targeted by this assay, and inadequate number of viral copies(<138 copies/mL). A negative result must be combined with clinical observations, patient history, and epidemiological information. The expected result is Negative.  Fact Sheet for Patients:  BloggerCourse.com  Fact Sheet for Healthcare Providers:  SeriousBroker.it  This test is no t yet approved or cleared by the [place] FDA and  has been authorized for detection and/or diagnosis of SARS-CoV-2 by FDA under an Emergency Use Authorization (EUA). This EUA will remain  in effect (meaning this test can be used) for the duration of the COVID-19 declaration under Section 564(b)(1) of the Act, 21 U.S.C.section 360bbb-3(b)(1), unless the authorization is terminated  or revoked sooner.       Influenza A by PCR NEGATIVE NEGATIVE Final   Influenza B by PCR NEGATIVE NEGATIVE Final    Comment: (NOTE) The Xpert Xpress SARS-CoV-2/FLU/RSV plus assay is intended as an aid in the diagnosis of influenza from Nasopharyngeal swab specimens and should not be used as a sole basis for treatment. Nasal washings and aspirates are unacceptable for Xpert Xpress SARS-CoV-2/FLU/RSV testing.  Fact Sheet for Patients: BloggerCourse.com  Fact Sheet for Healthcare Providers: SeriousBroker.it  This test is not yet approved or cleared by the [place] FDA and has been authorized for detection and/or diagnosis of SARS-CoV-2 by FDA under an Emergency Use Authorization (EUA). This EUA will remain in effect (meaning this test can be used) for the duration of the COVID-19 declaration under Section 564(b)(1) of the  Act, 21 U.S.C. section 360bbb-3(b)(1), unless the authorization is terminated or revoked.  Performed at [facility], [address] [place], [place] [address]   Blood Culture (routine x 2)     Status: None   Collection Time: 07/05/21 12:06 AM   Specimen: BLOOD  Result Value Ref Range Status   Specimen Description BLOOD RIGHT ARM  Final   Special Requests   Final    BOTTLES DRAWN  AEROBIC AND ANAEROBIC Blood Culture adequate volume   Culture   Final    NO GROWTH 5 DAYS Performed at [facility], [address] [place], [place] [address]    Report Status 07/10/2021 FINAL  Final  Urine Culture     Status: Abnormal   Collection Time: 07/05/21  4:24 AM   Specimen: In/Out Cath Urine  Result Value Ref Range Status   Specimen Description IN/OUT CATH URINE  Final   Special Requests   Final    NONE Performed at [facility], [address], [place], [place] [address]    Culture (A)  Final    2,000 COLONIES/mL ESCHERICHIA COLI Confirmed Extended Spectrum Beta-Lactamase Producer (ESBL).  In bloodstream infections from ESBL organisms, carbapenems are preferred over piperacillin/tazobactam. They are shown to have a lower risk of mortality. 1,000 COLONIES/mL ENTEROCOCCUS FAECALIS    Report Status 07/08/2021 FINAL  Final   Organism ID, Bacteria ESCHERICHIA COLI (A)  Final   Organism ID, Bacteria ENTEROCOCCUS FAECALIS (A)  Final      Susceptibility   Escherichia coli - MIC*    AMPICILLIN >=32 RESISTANT Resistant     CEFAZOLIN >=64 RESISTANT Resistant     CEFEPIME 2 SENSITIVE Sensitive     CEFTRIAXONE >=64 RESISTANT Resistant     CIPROFLOXACIN >=4 RESISTANT Resistant     GENTAMICIN >=16 RESISTANT Resistant     IMIPENEM <=0.25 SENSITIVE Sensitive     NITROFURANTOIN <=16 SENSITIVE Sensitive     TRIMETH/SULFA <=20 SENSITIVE Sensitive     AMPICILLIN/SULBACTAM >=32 RESISTANT Resistant     PIP/TAZO <=4 SENSITIVE Sensitive     * 2,000 COLONIES/mL ESCHERICHIA COLI    Enterococcus faecalis - MIC*    AMPICILLIN <=2 SENSITIVE Sensitive     NITROFURANTOIN <=16 SENSITIVE Sensitive     VANCOMYCIN 1 SENSITIVE Sensitive     * 1,000 COLONIES/mL ENTEROCOCCUS FAECALIS  Respiratory (~20 pathogens) panel by PCR     Status: None   Collection Time: 07/06/21 12:20 PM   Specimen: Nasopharyngeal Swab; Respiratory  Result Value Ref Range Status   Adenovirus NOT DETECTED NOT DETECTED Final   Coronavirus 229E NOT DETECTED NOT DETECTED Final    Comment: (NOTE) The Coronavirus on the Respiratory Panel, DOES NOT test for the novel  Coronavirus (2019 nCoV)    Coronavirus HKU1 NOT DETECTED NOT DETECTED Final   Coronavirus NL63 NOT DETECTED NOT DETECTED Final   Coronavirus OC43 NOT DETECTED NOT DETECTED Final   Metapneumovirus NOT DETECTED NOT DETECTED Final   Rhinovirus / Enterovirus NOT DETECTED NOT DETECTED Final   Influenza A NOT DETECTED NOT DETECTED Final   Influenza B NOT DETECTED NOT DETECTED Final   Parainfluenza Virus 1 NOT DETECTED NOT DETECTED Final   Parainfluenza Virus 2 NOT DETECTED NOT DETECTED Final   Parainfluenza Virus 3 NOT DETECTED NOT DETECTED Final   Parainfluenza Virus 4 NOT DETECTED NOT DETECTED Final   Respiratory Syncytial Virus NOT DETECTED NOT DETECTED Final   Bordetella pertussis NOT DETECTED NOT DETECTED Final   Bordetella Parapertussis NOT DETECTED NOT DETECTED Final   Chlamydophila pneumoniae NOT DETECTED NOT DETECTED Final   Mycoplasma pneumoniae NOT DETECTED NOT DETECTED Final    Comment: Performed at [facility], [address] [place], [place] [address]     Medications:    acidophilus  1 capsule Oral Daily   aspirin EC  81 mg Oral QHS   carvedilol  12.5 mg Oral BID WC   hydroxychloroquine  200  mg Oral BID   insulin aspart  0-6 Units Subcutaneous TID WC   leflunomide  20 mg Oral QPM   predniSONE  7.5 mg Oral q morning   senna-docusate  2 tablet Oral Q breakfast   And   senna-docusate  1 tablet Oral BID   Continuous  Infusions:  heparin 1,150 Units/hr (07/11/21 1050)      LOS: 6 days   Zannie Cove  [facility]  07/11/2021, 1:37 PM

## 2021-07-11 NOTE — Progress Notes (Signed)
[facility] Progress Note    Assessment/ Plan:    AKI/CKD stage IIIa - likely combination of contrast induced nephropathy as well as cardiorenal syndrome with diuresis.  Torsemide on hold.  UA negative for blood or protein.  No leukocytes.  Renal US without obstruction.  Currently on IVF's and lasix on hold.  Dr. Arrie Aran spoke with patient and her daughter yesterday and they confirmed that she is not interested in RRT and if her renal function does not start to improve, will need to consult palliative care and consider transition to hospice.   Agree with stopping IVF's  Avoid any further nephrotoxic agents such as phosphorus containing bowel preps (FLEETS), IV contrast, NSAIDs/Cox II I's, avoid baclofen.   Renal dose meds for GFR <10 ml/min Unfortunately, kidney function continues to decline despite fluids, hold on lasix for today. Recommend consulting palliative care Acute on chronic CHF - initially responded to IV lasix but now with AKI and decreased UOP since lasix stopped.   Left leg DVT - started on lovenox per primary Hypervolemic hyponatremia - due to CHF and AKI.  Resume lasix tomorrow and follow.  Fever and leukocytosis - CT of abd and pelvis without abnormality.  Urine and blood cultures negative to date as well as respiratory panel.  Appears to have resolved. Acute metabolic encephalopathy - improved per daughter.  HTN Rheumatoid arthritis - on chronic prednisone. Anemia of CKD stage IIIa - hold off on ESA given CHF. Right middle lobe nodular opacity seen on CT scan - to f/u with noncontrasted CT in 6 months.  Anthony Sar, MD [facility]   Subjective:   No acute events overnight. Appetite is poor. Off fluids now. Urine output now measured.   Objective:   BP 132/85 (BP Location: Left Arm)   Pulse 74   Temp 98 F (36.7 C) (Oral)   Resp 20   Ht 5\' 4"  (1.626 m)   Wt 96.5 kg   SpO2 100%   BMI 36.51 kg/m   Intake/Output Summary (Last 24  hours) at 07/11/2021 1256 Last data filed at 07/10/2021 1855 Gross per 24 hour  Intake 524.04 ml  Output --  Net 524.04 ml   Weight change:   Physical Exam: 09/09/2021 ill appearing, nad CVS:rrr Resp:decreased air entry bibasilar VZD:GLOVFIEPPIR Ext: trace edema bl LE's Neuro: awake, alert, no asterixis  Imaging: JJO:ACZYS RENAL  Result Date: 07/09/2021 CLINICAL DATA:  Acute kidney injury. EXAM: RENAL / URINARY TRACT ULTRASOUND COMPLETE COMPARISON:  None. FINDINGS: Right Kidney: Renal measurements: 8.6 cm x 4.1 cm x 4.4 cm = volume: 90.99 mL. Echogenicity within normal limits. A 5.2 cm x 4.0 cm x 3.9 cm anechoic structure is seen within the upper pole of the right kidney. No abnormal flow is noted within this region on color Doppler evaluation. No hydronephrosis is visualized. Left Kidney: Renal measurements: 10.5 cm x 4.9 cm x 4.7 cm = volume: 125.11 mL. Echogenicity within normal limits. No mass or hydronephrosis visualized. Bladder: The urinary bladder is empty and subsequently limited in evaluation. Other: None. IMPRESSION: Large simple right renal cyst, likely benign. No additional follow-up is recommended. Electronically Signed   By: 07/11/2021 [name]   On: 07/09/2021 19:17    Labs: BMET Recent Labs  Lab 07/04/21 1950 07/05/21 07/07/21 07/06/21 0444 07/07/21 0118 07/08/21 0117 07/09/21 0829 07/10/21 0258 07/11/21 0602  NA 137  --  139 139 136 132* 127* 126*  K 3.6  --  2.9* 3.9 4.1 5.3* 4.8 4.7  CL 104  --  101 102 103 99 96* 96*  CO2 25  --  25 24 24  19* 18* 17*  GLUCOSE 96  --  143* 87 71 176* 162* 82  BUN 33*  --  32* 34* 36* 45* 49* 54*  CREATININE 1.29* 1.18* 1.10* 1.37* 2.21* 3.51* 3.70* 4.30*  CALCIUM 8.7*  --  9.0 8.8* 8.5* 8.7* 8.3* 8.0*   CBC Recent Labs  Lab 07/04/21 1950 07/05/21 0656 07/07/21 0118 07/08/21 1106 07/09/21 0205 07/10/21 0258 07/11/21 0602  WBC 9.0   < > 10.9* 7.6 7.2 10.7* 10.2  NEUTROABS 6.7  --  8.1* 5.1 6.6  --   --   HGB 10.6*   < >  9.7* 9.5* 10.1* 9.4* 9.3*  HCT 33.3*   < > 30.6* 30.4* 31.8* 29.4* 28.8*  MCV 98.5   < > 95.6 96.5 95.8 95.5 94.1  PLT 209   < > 221 203 243 227 224   < > = values in this interval not displayed.    Medications:     acidophilus  1 capsule Oral Daily   aspirin EC  81 mg Oral QHS   carvedilol  12.5 mg Oral BID WC   hydroxychloroquine  200 mg Oral BID   insulin aspart  0-6 Units Subcutaneous TID WC   leflunomide  20 mg Oral QPM   predniSONE  7.5 mg Oral q morning   senna-docusate  2 tablet Oral Q breakfast   And   senna-docusate  1 tablet Oral BID      09/10/21, MD [facility] 07/11/2021, 12:56 PM

## 2021-07-11 NOTE — Evaluation (Signed)
Clinical/Bedside Swallow Evaluation Patient Details  Name: [name] [name] MRN: [id number] Date of Birth: 1930/02/04  Today's Date: 07/11/2021 Time: SLP Start Time (ACUTE ONLY): 1200 SLP Stop Time (ACUTE ONLY): 1216 SLP Time Calculation (min) (ACUTE ONLY): 16 min  Past Medical History:  Past Medical History:  Diagnosis Date   Allergic rhinitis due to pollen 04/27/2007   Arthritis    "in q joint" (08/07/2013)   CHF (congestive heart failure) (HCC)    Coronary atherosclerosis of native coronary artery    Cough    Edema 05/03/2013   Exertional shortness of breath    "sometimes" (08/07/2013)   First degree atrioventricular block    Gout, unspecified 04/19/2013   Hyperlipidemia 04/27/2007   Hypertension    Midline low back pain without sciatica 06/27/2014   Muscle spasm    Muscle weakness (generalized)    Onychia and paronychia of toe    Osteoarthrosis, unspecified whether generalized or localized, unspecified site 04/27/2007   Other fall    Other malaise and fatigue    Pneumonia 05/2018   Prepatellar bursitis    Pulmonary embolism (HCC) 07/2018   Rheumatic nodule (HCC) 06/28/2017   Seizures (HCC)    Stroke (HCC) 01/17/2014   Type II diabetes mellitus (HCC)    "fasting 90-110s" (08/07/2013)   Unspecified vitamin D deficiency    Past Surgical History:  Past Surgical History:  Procedure Laterality Date   ABDOMINAL HYSTERECTOMY  04/1980   APPENDECTOMY  1960   CARDIAC CATHETERIZATION  2003   CATARACT EXTRACTION W/ INTRAOCULAR LENS  IMPLANT, BILATERAL Bilateral ~ 2012   CHOLECYSTECTOMY N/A 06/26/2018   Procedure: LAPAROSCOPIC CHOLECYSTECTOMY POSSIBLE INTRAOPERATIVE CHOLANGIOGRAM;  Surgeon: Abigail Miyamoto, MD;  Location: [facility] OR;  Service: General;  Laterality: N/A;   JOINT REPLACEMENT     REPLACEMENT TOTAL KNEE Right 09/2005   RIGHT/LEFT HEART CATH AND CORONARY ANGIOGRAPHY N/A 06/01/2018   Procedure: RIGHT/LEFT HEART CATH AND CORONARY ANGIOGRAPHY;  Surgeon: Corky Crafts,  MD;  Location: [facility] INVASIVE CV LAB;  Service: Cardiovascular;  Laterality: N/A;   SHOULDER OPEN ROTATOR CUFF REPAIR Right 07/1999   TONSILLECTOMY  08/1974   TOTAL KNEE ARTHROPLASTY Left 08/07/2013   Procedure: TOTAL KNEE ARTHROPLASTY- LEFT;  Surgeon: Dannielle Huh, MD;  Location: [facility] OR;  Service: Orthopedics;  Laterality: Left;   TRANSTHORACIC ECHOCARDIOGRAM  2003   EF 55-65%; mild concentric LVH   HPI:  [name] [name] is an 87 y.o. female past medical history of chronic combined systolic and diastolic heart failure with an EF of 60%, rheumatoid arthritis, essential hypertension CAD PE currently not on anticoagulation comes into the ED for shortness of breath and acute encephalopathy.  CT angio of the chest was negative for PE   Assessment / Plan / Recommendation Clinical Impression  Pt demonstrates no signs of aspiration with thin liquids and is able to masticate solids with a sip of liquids. She reports no difficulty but her appetite has not been good. Pt needs to be repositioned upright for meals. RN today also reports pt did well when upright. Pt may have struggled when being fed when more reclined. Reviewed precautions with pt and family to request repositioning. Pt to continue a regular diet and thin liquids. SLP will sign off. SLP Visit Diagnosis: Dysphagia, unspecified (R13.10)    Aspiration Risk  Mild aspiration risk;Risk for inadequate nutrition/hydration    Diet Recommendation Regular;Thin liquid   Liquid Administration via: Cup;Straw Medication Administration: Whole meds with liquid    Other  Recommendations  Follow up Recommendations None      Frequency and Duration            Prognosis        Swallow Study   General HPI: [name] [name] is an 87 y.o. female past medical history of chronic combined systolic and diastolic heart failure with an EF of 60%, rheumatoid arthritis, essential hypertension CAD PE currently not on anticoagulation comes into the ED for  shortness of breath and acute encephalopathy.  CT angio of the chest was negative for PE Type of Study: Bedside Swallow Evaluation Diet Prior to this Study: Regular;Thin liquids History of Recent Intubation: No Behavior/Cognition: Alert;Cooperative;Pleasant mood Oral Cavity Assessment: Within Functional Limits Oral Care Completed by SLP: No Oral Cavity - Dentition: Missing dentition;Adequate natural dentition Self-Feeding Abilities: Able to feed self Patient Positioning: Upright in bed Baseline Vocal Quality: Normal Volitional Cough: Strong    Oral/Motor/Sensory Function Overall Oral Motor/Sensory Function: Within functional limits   Ice Chips     Thin Liquid Thin Liquid: Within functional limits Presentation: Straw    Nectar Thick Nectar Thick Liquid: Not tested   Honey Thick Honey Thick Liquid: Not tested   Puree Puree: Within functional limits   Solid            [name] [name], [name] [name] 07/11/2021,1:31 PM

## 2021-07-12 DIAGNOSIS — Z66 Do not resuscitate: Secondary | ICD-10-CM

## 2021-07-12 LAB — CBC
HCT: 28.7 % — ABNORMAL LOW (ref 36.0–46.0)
Hemoglobin: 9.5 g/dL — ABNORMAL LOW (ref 12.0–15.0)
MCH: 30.8 pg (ref 26.0–34.0)
MCHC: 33.1 g/dL (ref 30.0–36.0)
MCV: 93.2 fL (ref 80.0–100.0)
Platelets: 243 10*3/uL (ref 150–400)
RBC: 3.08 MIL/uL — ABNORMAL LOW (ref 3.87–5.11)
RDW: 17.8 % — ABNORMAL HIGH (ref 11.5–15.5)
WBC: 8.9 10*3/uL (ref 4.0–10.5)
nRBC: 0.2 % (ref 0.0–0.2)

## 2021-07-12 LAB — HEPARIN LEVEL (UNFRACTIONATED)
Heparin Unfractionated: 0.43 IU/mL (ref 0.30–0.70)
Heparin Unfractionated: 0.29 IU/mL — ABNORMAL LOW (ref 0.30–0.70)
Heparin Unfractionated: 0.3 IU/mL (ref 0.30–0.70)

## 2021-07-12 LAB — BASIC METABOLIC PANEL
Anion gap: 14 (ref 5–15)
BUN: 55 mg/dL — ABNORMAL HIGH (ref 8–23)
CO2: 16 mmol/L — ABNORMAL LOW (ref 22–32)
Calcium: 8 mg/dL — ABNORMAL LOW (ref 8.9–10.3)
Chloride: 97 mmol/L — ABNORMAL LOW (ref 98–111)
Creatinine, Ser: 4.13 mg/dL — ABNORMAL HIGH (ref 0.44–1.00)
GFR, Estimated: 10 mL/min — ABNORMAL LOW (ref >60)
Glucose, Bld: 72 mg/dL (ref 70–99)
Potassium: 5.2 mmol/L — ABNORMAL HIGH (ref 3.5–5.1)
Sodium: 127 mmol/L — ABNORMAL LOW (ref 135–145)

## 2021-07-12 LAB — GLUCOSE, CAPILLARY
Glucose-Capillary: 81 mg/dL (ref 70–99)
Glucose-Capillary: 92 mg/dL (ref 70–99)
Glucose-Capillary: 125 mg/dL — ABNORMAL HIGH (ref 70–99)
Glucose-Capillary: 110 mg/dL — ABNORMAL HIGH (ref 70–99)

## 2021-07-12 MED ORDER — SODIUM ZIRCONIUM CYCLOSILICATE 5 G PO PACK
5.0000 g | PACK | Freq: Once | ORAL | Status: AC
  Administered 2021-07-12: 5 g via ORAL
  Filled 2021-07-12: qty 1

## 2021-07-12 MED ORDER — SODIUM BICARBONATE 650 MG PO TABS
1300.0000 mg | ORAL_TABLET | Freq: Two times a day (BID) | ORAL | Status: DC
  Administered 2021-07-12 – 2021-07-16 (×9): 1300 mg via ORAL
  Filled 2021-07-12 (×10): qty 2

## 2021-07-12 NOTE — Progress Notes (Signed)
[facility] PROGRESS NOTE    Progress Note  [name] [name]  RDE:[id number] DOB: October 06, 1930 DOA: 07/04/2021 PCP: Pcp, No     Brief Narrative:   [name] [name] is an 87 y.o. female past medical history of chronic combined systolic and diastolic heart failure with an EF of 60%, rheumatoid arthritis, essential hypertension CAD PE currently not on anticoagulation comes into the ED for shortness of breath and acute encephalopathy.  CT angio of the chest was negative for PE  -Diuresed this admission -Hospital course complicated by severe AKI-suspected contrast nephropathy   Assessment/Plan:   Acute kidney injury Contrast nephropathy -creatinine bumped from baseline of 1.3 up to 2.2 ->3.5>3.7>4.3>4.1 today -suspect contrast-induced nephropathy, got IV contrast with CTA in the ED on 8/26 midnight and 8/28 for CT abdomen -Diuretics held,  urine output is poor as well, renal ultrasound without hydronephrosis  -Appreciate Nephrology input, prognosis is guarded in the setting of worsening renal function, poor urine output, discussed with [name] on multiple occasions, she understands and accepts that he still is not a dialysis candidate, they are agreeable to hospice/comfort focused care if kidney function does not improve -Off IV fluids -Palliative input requested  Acute on chronic systolic CHF (congestive heart failure) (HCC): -CTA chest was negative for PE on admission, noted to have 2-3+ lower extremity edema initially, diuresed with IV Lasix earlier this admission, then transitioned to oral torsemide -Creatinine has bumped up significantly, diuretics on hold  Age indeterminant DVT left leg -Dopplers on admission 8/27 noted chronic left femoral vein DVT -Now on IV heparin in the setting of worsening AKI  Fever and leukocytosis: -Resolved, likely viral illness, COVID PCR was negative, CT chest, urine culture unremarkable, blood cultures are negative, respiratory virus  panel negative  -Afebrile and leukocytosis has resolved now  Acute metabolic encephalopathy: -resolved  Hypertension: -Continue Coreg, amlodipine on hold, hold torsemide   Rheumatoid arthritis involving both knees (HCC): -Chronic prednisone continue 7.5 mg daily  Normocytic anemia: -Stable, monitor  History of CAD, Denies any chest pain.  History of diabetes mellitus type 2: A1c of 5.8 continue sliding scale insulin   DVT prophylaxis: Heparin CODE STATUS: DNR Family Communication: Discussed with [name] at bedside yesterday Status is: Inpatient inpatient because: Hemodynamically unstable  Dispo: The patient is from: SNF              Anticipated d/c is to: SNF              Patient currently is not medically stable to d/c.   Difficult to place patient No    Subjective:  -Has poor appetite, no further nausea or vomiting, denies any dyspnea  Objective:    Vitals:   07/11/21 1138 07/11/21 1944 07/12/21 0400 07/12/21 0600  BP: 132/85 (!) 143/83 (!) 147/84   Pulse: 74 77 76   Resp: 20 20 18    Temp: 98 F (36.7 C) 98.2 F (36.8 C) 98.2 F (36.8 C)   TempSrc: Oral Oral Oral   SpO2: 100% 100% 100%   Weight:    100.6 kg  Height:       SpO2: 100 % O2 Flow Rate (L/min): 1 L/min   Intake/Output Summary (Last 24 hours) at 07/12/2021 1111 Last data filed at 07/12/2021 0800 Gross per 24 hour  Intake 1293.1 ml  Output 300 ml  Net 993.1 ml   Filed Weights   07/09/21 0354 07/10/21 0452 07/12/21 0600  Weight: 97.1 kg 96.5 kg 100.6 kg  Exam: Obese pleasant female sitting up in bed, AAO x2, no distress, appears comfortable CVS: S1-S2, regular rate rhythm Lungs: Decreased breath sounds to bases Abdomen: Obese, soft, nontender, bowel sounds present Extremities: No edema  Skin: No rashes on exposed skin  Neuro: Generalized lower extremity weakness, disuse atrophy  Data Reviewed:    Labs: Basic Metabolic Panel: Recent Labs  Lab 07/07/21 0118  07/08/21 0117 07/09/21 0829 07/10/21 0258 07/11/21 0602 07/12/21 0454  NA 139 136 132* 127* 126* 127*  K 3.9 4.1 5.3* 4.8 4.7 5.2*  CL 102 103 99 96* 96* 97*  CO2 24 24 19* 18* 17* 16*  GLUCOSE 87 71 176* 162* 82 72  BUN 34* 36* 45* 49* 54* 55*  CREATININE 1.37* 2.21* 3.51* 3.70* 4.30* 4.13*  CALCIUM 8.8* 8.5* 8.7* 8.3* 8.0* 8.0*  MG 2.0  --   --   --   --   --    GFR Estimated Creatinine Clearance: 10.7 mL/min (A) (by C-G formula based on SCr of 4.13 mg/dL (H)). Liver Function Tests: No results for input(s): AST, ALT, ALKPHOS, BILITOT, PROT, ALBUMIN in the last 168 hours.  No results for input(s): LIPASE, AMYLASE in the last 168 hours. No results for input(s): AMMONIA in the last 168 hours. Coagulation profile No results for input(s): INR, PROTIME in the last 168 hours.  COVID-19 Labs  No results for input(s): DDIMER, FERRITIN, LDH, CRP in the last 72 hours.  Lab Results  Component Value Date   SARSCOV2NAA NEGATIVE 07/04/2021   SARSCOV2NAA NEGATIVE 03/26/2021   SARSCOV2NAA NEGATIVE 12/13/2019   SARSCOV2NAA POSITIVE (A) 10/26/2019    CBC: Recent Labs  Lab 07/07/21 0118 07/08/21 1106 07/09/21 0205 07/10/21 0258 07/11/21 0602 07/12/21 0454  WBC 10.9* 7.6 7.2 10.7* 10.2 8.9  NEUTROABS 8.1* 5.1 6.6  --   --   --   HGB 9.7* 9.5* 10.1* 9.4* 9.3* 9.5*  HCT 30.6* 30.4* 31.8* 29.4* 28.8* 28.7*  MCV 95.6 96.5 95.8 95.5 94.1 93.2  PLT 221 203 243 227 224 243   Cardiac Enzymes: No results for input(s): CKTOTAL, CKMB, CKMBINDEX, TROPONINI in the last 168 hours. BNP (last 3 results) No results for input(s): PROBNP in the last 8760 hours. CBG: Recent Labs  Lab 07/11/21 0738 07/11/21 1137 07/11/21 1601 07/11/21 2128 07/12/21 0755  GLUCAP 91 94 113* 110* 81   D-Dimer: No results for input(s): DDIMER in the last 72 hours. Hgb A1c: No results for input(s): HGBA1C in the last 72 hours. Lipid Profile: No results for input(s): CHOL, HDL, LDLCALC, TRIG, CHOLHDL,  LDLDIRECT in the last 72 hours. Thyroid function studies: No results for input(s): TSH, T4TOTAL, T3FREE, THYROIDAB in the last 72 hours.  Invalid input(s): FREET3 Anemia work up: No results for input(s): VITAMINB12, FOLATE, FERRITIN, TIBC, IRON, RETICCTPCT in the last 72 hours. Sepsis Labs: Recent Labs  Lab 07/06/21 1045 07/07/21 0118 07/08/21 0117 07/08/21 1106 07/09/21 0205 07/10/21 0258 07/11/21 0602 07/12/21 0454  PROCALCITON 0.21 0.23 0.26  --   --   --   --   --   WBC  --  10.9*  --    < > 7.2 10.7* 10.2 8.9   < > = values in this interval not displayed.   Microbiology Recent Results (from the past 240 hour(s))  Blood Culture (routine x 2)     Status: None   Collection Time: 07/04/21  7:50 PM   Specimen: BLOOD  Result Value Ref Range Status   Specimen Description BLOOD LEFT  ARM  Final   Special Requests   Final    BOTTLES DRAWN AEROBIC AND ANAEROBIC Blood Culture results may not be optimal due to an excessive volume of blood received in culture bottles   Culture   Final    NO GROWTH 5 DAYS Performed at [facility], [address], [place], [place] [address]    Report Status 07/09/2021 FINAL  Final  Resp Panel by RT-PCR (Flu A&B, Covid) Nasopharyngeal Swab     Status: None   Collection Time: 07/04/21  9:16 PM   Specimen: Nasopharyngeal Swab; Nasopharyngeal(NP) swabs in vial transport medium  Result Value Ref Range Status   SARS Coronavirus 2 by RT PCR NEGATIVE NEGATIVE Final    Comment: (NOTE) SARS-CoV-2 target nucleic acids are NOT DETECTED.  The SARS-CoV-2 RNA is generally detectable in upper respiratory specimens during the acute phase of infection. The lowest concentration of SARS-CoV-2 viral copies this assay can detect is 138 copies/mL. A negative result does not preclude SARS-Cov-2 infection and should not be used as the sole basis for treatment or other patient management decisions. A negative result may occur with  improper specimen  collection/handling, submission of specimen other than nasopharyngeal swab, presence of viral mutation(s) within the areas targeted by this assay, and inadequate number of viral copies(<138 copies/mL). A negative result must be combined with clinical observations, patient history, and epidemiological information. The expected result is Negative.  Fact Sheet for Patients:  BloggerCourse.com  Fact Sheet for Healthcare Providers:  SeriousBroker.it  This test is no t yet approved or cleared by the [place] FDA and  has been authorized for detection and/or diagnosis of SARS-CoV-2 by FDA under an Emergency Use Authorization (EUA). This EUA will remain  in effect (meaning this test can be used) for the duration of the COVID-19 declaration under Section 564(b)(1) of the Act, 21 U.S.C.section 360bbb-3(b)(1), unless the authorization is terminated  or revoked sooner.       Influenza A by PCR NEGATIVE NEGATIVE Final   Influenza B by PCR NEGATIVE NEGATIVE Final    Comment: (NOTE) The Xpert Xpress SARS-CoV-2/FLU/RSV plus assay is intended as an aid in the diagnosis of influenza from Nasopharyngeal swab specimens and should not be used as a sole basis for treatment. Nasal washings and aspirates are unacceptable for Xpert Xpress SARS-CoV-2/FLU/RSV testing.  Fact Sheet for Patients: BloggerCourse.com  Fact Sheet for Healthcare Providers: SeriousBroker.it  This test is not yet approved or cleared by the [place] FDA and has been authorized for detection and/or diagnosis of SARS-CoV-2 by FDA under an Emergency Use Authorization (EUA). This EUA will remain in effect (meaning this test can be used) for the duration of the COVID-19 declaration under Section 564(b)(1) of the Act, 21 U.S.C. section 360bbb-3(b)(1), unless the authorization is terminated or revoked.  Performed at [facility], [address], [place], [place] [address]   Blood Culture (routine x 2)     Status: None   Collection Time: 07/05/21 12:06 AM   Specimen: BLOOD  Result Value Ref Range Status   Specimen Description BLOOD RIGHT ARM  Final   Special Requests   Final    BOTTLES DRAWN AEROBIC AND ANAEROBIC Blood Culture adequate volume   Culture   Final    NO GROWTH 5 DAYS Performed at [facility], [address], [place], [place] [address]    Report Status 07/10/2021 FINAL  Final  Urine Culture     Status: Abnormal  Collection Time: 07/05/21  4:24 AM   Specimen: In/Out Cath Urine  Result Value Ref Range Status   Specimen Description IN/OUT CATH URINE  Final   Special Requests   Final    NONE Performed at [facility], [address], [place], [place] [address]    Culture (A)  Final    2,000 COLONIES/mL ESCHERICHIA COLI Confirmed Extended Spectrum Beta-Lactamase Producer (ESBL).  In bloodstream infections from ESBL organisms, carbapenems are preferred over piperacillin/tazobactam. They are shown to have a lower risk of mortality. 1,000 COLONIES/mL ENTEROCOCCUS FAECALIS    Report Status 07/08/2021 FINAL  Final   Organism ID, Bacteria ESCHERICHIA COLI (A)  Final   Organism ID, Bacteria ENTEROCOCCUS FAECALIS (A)  Final      Susceptibility   Escherichia coli - MIC*    AMPICILLIN >=32 RESISTANT Resistant     CEFAZOLIN >=64 RESISTANT Resistant     CEFEPIME 2 SENSITIVE Sensitive     CEFTRIAXONE >=64 RESISTANT Resistant     CIPROFLOXACIN >=4 RESISTANT Resistant     GENTAMICIN >=16 RESISTANT Resistant     IMIPENEM <=0.25 SENSITIVE Sensitive     NITROFURANTOIN <=16 SENSITIVE Sensitive     TRIMETH/SULFA <=20 SENSITIVE Sensitive     AMPICILLIN/SULBACTAM >=32 RESISTANT Resistant     PIP/TAZO <=4 SENSITIVE Sensitive     * 2,000 COLONIES/mL ESCHERICHIA COLI   Enterococcus faecalis - MIC*    AMPICILLIN <=2 SENSITIVE Sensitive     NITROFURANTOIN <=16 SENSITIVE Sensitive      VANCOMYCIN 1 SENSITIVE Sensitive     * 1,000 COLONIES/mL ENTEROCOCCUS FAECALIS  Respiratory (~20 pathogens) panel by PCR     Status: None   Collection Time: 07/06/21 12:20 PM   Specimen: Nasopharyngeal Swab; Respiratory  Result Value Ref Range Status   Adenovirus NOT DETECTED NOT DETECTED Final   Coronavirus 229E NOT DETECTED NOT DETECTED Final    Comment: (NOTE) The Coronavirus on the Respiratory Panel, DOES NOT test for the novel  Coronavirus (2019 nCoV)    Coronavirus HKU1 NOT DETECTED NOT DETECTED Final   Coronavirus NL63 NOT DETECTED NOT DETECTED Final   Coronavirus OC43 NOT DETECTED NOT DETECTED Final   Metapneumovirus NOT DETECTED NOT DETECTED Final   Rhinovirus / Enterovirus NOT DETECTED NOT DETECTED Final   Influenza A NOT DETECTED NOT DETECTED Final   Influenza B NOT DETECTED NOT DETECTED Final   Parainfluenza Virus 1 NOT DETECTED NOT DETECTED Final   Parainfluenza Virus 2 NOT DETECTED NOT DETECTED Final   Parainfluenza Virus 3 NOT DETECTED NOT DETECTED Final   Parainfluenza Virus 4 NOT DETECTED NOT DETECTED Final   Respiratory Syncytial Virus NOT DETECTED NOT DETECTED Final   Bordetella pertussis NOT DETECTED NOT DETECTED Final   Bordetella Parapertussis NOT DETECTED NOT DETECTED Final   Chlamydophila pneumoniae NOT DETECTED NOT DETECTED Final   Mycoplasma pneumoniae NOT DETECTED NOT DETECTED Final    Comment: Performed at [facility], [address] [place], [place] [address]     Medications:    acidophilus  1 capsule Oral Daily   aspirin EC  81 mg Oral QHS   carvedilol  12.5 mg Oral BID WC   hydroxychloroquine  200 mg Oral BID   insulin aspart  0-6 Units Subcutaneous TID WC   leflunomide  20 mg Oral QPM   predniSONE  7.5 mg Oral q morning   senna-docusate  2 tablet Oral Q breakfast   And   senna-docusate  1 tablet Oral BID   sodium  bicarbonate  1,300 mg Oral BID   Continuous Infusions:  heparin 1,300 Units/hr (07/12/21 0011)      LOS: 7 days    Zannie Cove  [facility]  07/12/2021, 11:11 AM

## 2021-07-12 NOTE — Consult Note (Signed)
Palliative Medicine Inpatient Consult Note  Consulting Provider: Domenic Polite, MD  Reason for consult:   [place] Palliative Medicine Consult  Reason for Consult? goals of care   HPI:  Per intake H&P --> [name] [name] is an 87 y.o. female past medical history of chronic combined systolic and diastolic heart failure with an EF of 60%, rheumatoid arthritis, essential hypertension CAD PE currently not on anticoagulation comes into the ED for shortness of breath and acute encephalopathy, being treated with diuretics presently.  CT angio of the chest was negative for PE, severe AKI-suspected in the setting of contrast nephropathy.  Palliative care has been asked to get involved to further address goals of care in the setting of multiple co-morbidities and acute illness(s).  Clinical Assessment/Goals of Care:  *Please note that this is a verbal dictation therefore any spelling or grammatical errors are due to the "[place] One" system interpretation.  I have reviewed medical records including EPIC notes, labs and imaging, received report from bedside RN, assessed the patient who is lying in bed in no acute distress.  She asked me to help her find her cell phone which we are able to do   I met with [name] [name] and called her daughter, [name] [name] to further discuss diagnosis prognosis, GOC, EOL wishes, disposition and options.  [name] [name] and I discussed her chronic comorbid conditions inclusive of her history of diastolic heart failure, rheumatoid arthritis, coronary artery disease.  [name] [name] shares with me that she has lived at [facility] custodial living for the past 3 months and rather than her improving she has continued to decline.  She shares that she is not able to do much of anything for herself anymore due to her arthritis which is very distressing.   I introduced Palliative Medicine as specialized medical care for people living with serious illness.  It focuses on providing relief from the symptoms and stress of a serious illness. The goal is to improve quality of life for both the patient and the family.  Patient is originally from [place], [place]. 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  [name] [name] spouse is no longer with [place] and she has 3 grown daughters.  She is a woman of faith and was raised within the [name].  A detailed discussion was had today regarding advanced directives -patient shares with me that her daughter, [name] [name] is her primary Media planner.    Concepts specific to code status, artifical feeding and hydration, continued IV antibiotics and rehospitalization was had.  [name] [name] is a DO NOT RESUSCITATE, DO NOT INTUBATE CODE STATUS.  A review of her most recent MOST was completed which is geared towards more of a comfort emphasis.  For review with patient's [name] has had outpatient palliative care is a very recently she realizes [name] [name]'s condition has worsened and was at odds with whether or not she should send her to the hospital.  [name] [name] shares that she did not feel like [name] [name]'s symptom and care needs were being met quickly enough therefore she asked them to call EMS to transition her to the hospital.  We reviewed whether or not it is time to except hospice as a care emphasis for [facility].  [name] [name] had shared with me that she would prefer not to come back and forth to the hospital which  I shared with her [name] who is also in agreement with this.  I described hospice as a service for patients for have a life expectancy of < 6 months. It preserves dignity and quality at the end phases of life. The focus changes from curative to symptom relief.  Both [name] [name] and her daughter would be in agreement with this modality of care.  I have reached out to the case management team and  the hospice liaison to continue further conversations.  As of our conversation presently [name] [name] will transition back to [name] [name] once medically optimized with the emphasis of hospice care when she arrives there.  The goal will be for no longer to be traveling back and forth to the hospital and to have symptom needs addressed in her custodial setting.  That and I discussed that if her symptom burden needs were to become significant and her concerns were they were not being well cared for at the custodial living environment that she may transition to an inpatient hospice home for more comprehensive care.  I have requested that the hospice liaison address these questions specifically.  Discussed the importance of continued conversation with family and their  medical providers regarding overall plan of care and treatment options, ensuring decisions are within the context of the patients values and GOCs. ________________________ Collateral information obtained from [name] [name] SNF. [name] Hattaway, RN and I had a conversation regarding [name]'s overall her poor clinical state.  [place]it shares with me that at this time she does believe hospice would be appropriate. She shares that patient is dependent for all ADL's with the occasional ability to very slowly feed herself. She reviews that her edema, incremental respiratory distress have been ongoing despite continued care and per her estimation she has seen her continue to decline over time.   Decision Maker: [name] [name] (Daughter) [phone]  SUMMARY OF RECOMMENDATIONS   DNAR/DNI  MOST/DNR in [place] liaison speaking to patients daughter regarding care at her custodial living home  Appreciate TOC involvement for discharge plan  PMT will remain involved to support patient and family  Code Status/Advance Care Planning: DNAR/DNI   Palliative Prophylaxis:  Oral Care, Turn Q2H  Additional Recommendations (Limitations,  Scope, Preferences): Continue current scope of care   Psycho-social/Spiritual:  Desire for further Chaplaincy support: Yes Additional Recommendations: Education on chronic disease and hospice care   Prognosis: (+)2 IP admission in 6 months, dependent for bADL's, terminal frailty. Appropriate for hospice presently.   Discharge Planning: Discharge to [name] [name] with hospice care.   Vitals:   07/11/21 1944 07/12/21 0400  BP: (!) 143/83 (!) 147/84  Pulse: 77 76  Resp: 20 18  Temp: 98.2 F (36.8 C) 98.2 F (36.8 C)  SpO2: 100% 100%    Intake/Output Summary (Last 24 hours) at 07/12/2021 0655 Last data filed at 07/11/2021 1940 Gross per 24 hour  Intake 1233.1 ml  Output 100 ml  Net 1133.1 ml   Last Weight  Most recent update: 07/12/2021  6:31 AM    Weight  100.6 kg (221 lb 12.5 oz)            Gen:  Very frail elderly F in NAD HEENT: moist mucous membranes CV: Regular rate and rhythm  PULM:  On 1.5LPM [place] ABD: soft/nontender  QQP:[id number] edema Neuro: Alert and oriented x2-3   PPS: 30%   This conversation/these recommendations were discussed with patient primary care team, Dr. Broadus John  Time In: 1110 Time Out: 1220 Total  Time: 70 Greater than 50%  of this time was spent counseling and coordinating care related to the above assessment and plan.  [place] Team Team Cell Phone: [phone] Please utilize secure chat with additional questions, if there is no response within 30 minutes please call the above phone number  Palliative Medicine Team providers are available by phone from 7am to 7pm daily and can be reached through the team cell phone.  Should this patient require assistance outside of these hours, please call the patient's attending physician.

## 2021-07-12 NOTE — Progress Notes (Addendum)
[facility])  Received request to meet with family to discuss hospice. Patient was asleep and no family present. Contacted Para March to discuss hospice. She advised that they initially considered returning to [facility] with hospice, but think she may be better served by a residential facility after speaking with her care team today.  Para March advised she would prefer to work with [facility], as it was closer to her and she had some sort of contact with them in the past.   Made hospital [facility] aware.  Thank you, Wallis Bamberg RN, BSN, CCRN [facility] Liaison   **[phone], discussed with [facility] manager, and family would like to see if Ms. Hutto is [facility] appropriate, and if so, place her on our waiting list.  ** Ms. Twist dtr called to re-confirm that they want [facility] first, [facility] second and if no beds at either "soon" they will reconsider the [facility] location.

## 2021-07-12 NOTE — Progress Notes (Signed)
[facility] Progress Note    Assessment/ Plan:    AKI/CKD stage IIIa - likely combination of contrast induced nephropathy as well as cardiorenal syndrome with diuresis.  Torsemide on hold.  UA negative for blood or protein.  No leukocytes.  Renal US without obstruction.  Currently on IVF's and lasix on hold.  Spoke with daughter Ms Hamptom today again about long term plan. Plan remains the same: no renal replacement therapy, palliative care, and possible transition to hospice/comfort care if she continues to decline. At this junction, I am worried that she is having early uremic symptoms (loss of appetite, dysgeusia, intermittent altered cognition-as per daughter). Stopped IVFs, hold on lasix today. Cr relatively stable. Limit K in diet Appreciate palliative care's assistance. Strict I/O Avoid any further nephrotoxic agents such as phosphorus containing bowel preps (FLEETS), IV contrast, NSAIDs/Cox II I's, avoid baclofen.   Renal dose meds for GFR <10 ml/min Acute on chronic CHF - initially responded to IV lasix but now with AKI and decreased UOP since lasix stopped.  Volume status relatively stable today Metabolic acidosis secondary to #1: starting nahco3 Left leg DVT - started on lovenox per primary Hypervolemic hyponatremia - due to CHF and AKI.  Na stable Fever and leukocytosis - CT of abd and pelvis without abnormality.  Urine and blood cultures negative to date as well as respiratory panel.  Appears to have resolved. Acute metabolic encephalopathy - improved per daughter but occurring intermittently HTN Rheumatoid arthritis - on chronic prednisone. Anemia of CKD stage IIIa - hold off on ESA given CHF. Right middle lobe nodular opacity seen on CT scan - to f/u with noncontrasted CT in 6 months.  Anthony Sar, MD [facility]   Subjective:   No acute events overnight. Appetite remains poor, she reports that food has no taste at all now. No other  complaints. Spoke with daughter Ms Hamptom extensively on the phone just now. They are preparing for hospice once indicated. They are in full agreement about not doing dialysis   Objective:   BP (!) 147/84 (BP Location: Left Wrist)   Pulse 76   Temp 98.2 F (36.8 C) (Oral)   Resp 18   Ht 5\' 4"  (1.626 m)   Wt 100.6 kg   SpO2 100%   BMI 38.07 kg/m   Intake/Output Summary (Last 24 hours) at 07/12/2021 1239 Last data filed at 07/12/2021 1113 Gross per 24 hour  Intake 1483.01 ml  Output 300 ml  Net 1183.01 ml   Weight change:   Physical Exam: 09/11/2021 ill appearing, nad CVS:rrr Resp:decreased air entry bibasilar [id number] Ext: trace edema bl LE's Neuro: awake, alert, no asterixis  Imaging: No results found.  Labs: BMET Recent Labs  Lab 07/06/21 0444 07/07/21 0118 07/08/21 0117 07/09/21 0829 07/10/21 0258 07/11/21 0602 07/12/21 0454  NA 139 139 136 132* 127* 126* 127*  K 2.9* 3.9 4.1 5.3* 4.8 4.7 5.2*  CL 101 102 103 99 96* 96* 97*  CO2 25 24 24  19* 18* 17* 16*  GLUCOSE 143* 87 71 176* 162* 82 72  BUN 32* 34* 36* 45* 49* 54* 55*  CREATININE 1.10* 1.37* 2.21* 3.51* 3.70* 4.30* 4.13*  CALCIUM 9.0 8.8* 8.5* 8.7* 8.3* 8.0* 8.0*   CBC Recent Labs  Lab 07/07/21 0118 07/08/21 1106 07/09/21 0205 07/10/21 0258 07/11/21 0602 07/12/21 0454  WBC 10.9* 7.6 7.2 10.7* 10.2 8.9  NEUTROABS 8.1* 5.1 6.6  --   --   --   HGB 9.7* 9.5*  10.1* 9.4* 9.3* 9.5*  HCT 30.6* 30.4* 31.8* 29.4* 28.8* 28.7*  MCV 95.6 96.5 95.8 95.5 94.1 93.2  PLT 221 203 243 227 224 243    Medications:     acidophilus  1 capsule Oral Daily   aspirin EC  81 mg Oral QHS   carvedilol  12.5 mg Oral BID WC   hydroxychloroquine  200 mg Oral BID   insulin aspart  0-6 Units Subcutaneous TID WC   leflunomide  20 mg Oral QPM   predniSONE  7.5 mg Oral q morning   senna-docusate  2 tablet Oral Q breakfast   And   senna-docusate  1 tablet Oral BID   sodium bicarbonate  1,300 mg Oral BID   sodium  zirconium cyclosilicate  5 g Oral Once    Anthony Sar, MD [facility] 07/12/2021, 12:39 PM

## 2021-07-12 NOTE — TOC Progression Note (Signed)
Transition of Care [facility]) - Progression Note    Patient Details  Name: [name] [name] MRN: [id number] Date of Birth: [birth year]  Transition of Care [facility]) CM/SW Contact  Patrice Paradise, LCSW Phone Number: [phone] 07/12/2021, 3:44 PM  Clinical Narrative:     CSW received a return call from Coleman at [facility] and she informed CSW that pt's daughter has declined [employer] [employer] facility and now wants [facility].  CSW followed with Jenneifer at [facility] and [facility] does not have a bed as well. Both facilities are keeping pt on wait list and assessing for medical appropriateness.  TOC team will continue to assist with discharge planning needs.   Expected Discharge Plan: Skilled Nursing Facility Barriers to Discharge: Continued Medical Work up  Expected Discharge Plan and Services Expected Discharge Plan: Skilled Nursing Facility In-house Referral: Clinical Social Work     Living arrangements for the past 2 months: Skilled Nursing Facility                                       Social Determinants of Health (SDOH) Interventions    Readmission Risk Interventions No flowsheet data found.

## 2021-07-12 NOTE — Progress Notes (Signed)
ANTICOAGULATION CONSULT NOTE   Pharmacy Consult for Heparin Indication: DVT  Allergies  Allergen Reactions   Clonidine Derivatives Swelling    [name]   Fish Allergy Diarrhea, Swelling and Other (See Comments)    Turns skin "black," but can tolerate white fish Salmon- Diarrhea   Shellfish Allergy Hives   Doxycycline Rash and Other (See Comments)    Dizziness, also   Cetirizine Hcl Itching   Citrus Diarrhea and Other (See Comments)    Indigestion/heartburn, also; "Allergic to oranges" noted on MAR   Indomethacin Other (See Comments)    "Allergic," per MAR   Methyldopa Diarrhea   Morphine And Related Nausea And Vomiting and Other (See Comments)    "Family reports it drops her b/p that she needs iv fluids"   Morphine Sulfate Nausea And Vomiting and Other (See Comments)    "Family reports it drops her b/p that she needs iv fluids"   Pregabalin Nausea Only and Other (See Comments)    Hallucinations, also   Shellfish-Derived Products Itching and Other (See Comments)    Dizziness, also   Strawberry (Diagnostic) Itching   Strawberry Extract Itching and Other (See Comments)    Dizziness, also   Cetirizine Hcl Itching and Rash   Codeine Hives and Itching   Levaquin [Levofloxacin In D5w] Rash   Levofloxacin Rash   Tomato Rash    Patient Measurements: Height: 5\' 4"  (162.6 cm) Weight: 100.6 kg (221 lb 12.5 oz) IBW/kg (Calculated) : 54.7 Heparin dosing weight: 75.3 kg  Vital Signs: Temp: 98.2 F (36.8 C) (09/03 0400) Temp Source: Oral (09/03 0400) BP: 147/84 (09/03 0400) Pulse Rate: 76 (09/03 0400)  Labs: Recent Labs    07/10/21 0258 07/10/21 0258 07/11/21 0602 07/11/21 1822 07/12/21 0454 07/12/21 1229  HGB 9.4*  --  9.3*  --  9.5*  --   HCT 29.4*  --  28.8*  --  28.7*  --   PLT 227  --  224  --  243  --   HEPARINUNFRC  --    < > 0.19* 0.20* 0.43 0.29*  CREATININE 3.70*  --  4.30*  --  4.13*   --    < > = values in this interval not displayed.    Estimated Creatinine Clearance: 10.7 mL/min (A) (by C-G formula based on SCr of 4.13 mg/dL (H)).   Assessment: 87 yo female with chronic left femoral vein DVT of unknown age on lovenox and noted with AKI.  Last dose lovenox 8/31.  Transitioned to heparin 9/1.  ~6hr heparin level subtherapeutic at 0.29, on 1300 units/hr.  No line issues or signs/symptoms of bleeding per RN.   Goal of Therapy:  Heparin level 0.3-0.7 units/ml Monitor platelets by anticoagulation protocol: Yes   Plan:  Increase heparin to 1400 units/hr. Check ~6hr heparin level. Monitor CBC and for signs/symptoms of bleeding.   11/1, PharmD PGY1 Pharmacy Resident Phone [phone] 07/12/2021 1:21 PM   Please check AMION for all [facility] Pharmacy phone numbers After 10:00 PM, call Main Pharmacy [phone]

## 2021-07-12 NOTE — TOC Progression Note (Signed)
Transition of Care [facility]) - Progression Note    Patient Details  Name: [name] [name] MRN: [id number] Date of Birth: 1930/09/23  [facility]) CM/SW Contact  Patrice Paradise, [place] Phone Number: [phone] 07/12/2021, 2:31 PM  Clinical Narrative:    CSW spoke with Dennie Bible from [facility] to make a referral. CSW provided daughter Para March contact information. Dennie Bible explained that there are currently no beds in [facility] however would reach out to the daughter to ascertain if she would be agreeable to the facility in [place].  TOC team will continue to assist with discharge planning needs.    Expected Discharge Plan: Skilled Nursing Facility Barriers to Discharge: Continued Medical Work up  Expected Discharge Plan and Services Expected Discharge Plan: Skilled Nursing Facility In-house Referral: Clinical Social Work     Living arrangements for the past 2 months: Skilled Nursing Facility                                       Social Determinants of Health (SDOH) Interventions    Readmission Risk Interventions No flowsheet data found.

## 2021-07-12 NOTE — Progress Notes (Signed)
ANTICOAGULATION CONSULT NOTE   Pharmacy Consult for Heparin Indication: DVT  Allergies  Allergen Reactions   Clonidine Derivatives Swelling    [name]   Fish Allergy Diarrhea, Swelling and Other (See Comments)    Turns skin "black," but can tolerate white fish Salmon- Diarrhea   Shellfish Allergy Hives   Doxycycline Rash and Other (See Comments)    Dizziness, also   Cetirizine Hcl Itching   Citrus Diarrhea and Other (See Comments)    Indigestion/heartburn, also; "Allergic to oranges" noted on MAR   Indomethacin Other (See Comments)    "Allergic," per MAR   Methyldopa Diarrhea   Morphine And Related Nausea And Vomiting and Other (See Comments)    "Family reports it drops her b/p that she needs iv fluids"   Morphine Sulfate Nausea And Vomiting and Other (See Comments)    "Family reports it drops her b/p that she needs iv fluids"   Pregabalin Nausea Only and Other (See Comments)    Hallucinations, also   Shellfish-Derived Products Itching and Other (See Comments)    Dizziness, also   Strawberry (Diagnostic) Itching   Strawberry Extract Itching and Other (See Comments)    Dizziness, also   Cetirizine Hcl Itching and Rash   Codeine Hives and Itching   Levaquin [Levofloxacin In D5w] Rash   Levofloxacin Rash   Tomato Rash    Patient Measurements: Height: 5\' 4"  (162.6 cm) Weight: 100.6 kg (221 lb 12.5 oz) IBW/kg (Calculated) : 54.7 Heparin dosing weight: 75.3 kg  Vital Signs: Temp: 98.3 F (36.8 C) (09/03 2004) Temp Source: Oral (09/03 2004) BP: 139/94 (09/03 2004) Pulse Rate: 78 (09/03 2004)  Labs: Recent Labs    07/10/21 0258 07/11/21 0602 07/11/21 1822 07/12/21 0454 07/12/21 1229 07/12/21 2044  HGB 9.4* 9.3*  --  9.5*  --   --   HCT 29.4* 28.8*  --  28.7*  --   --   PLT 227 224  --  243  --   --   HEPARINUNFRC  --  0.19*   < > 0.43 0.29* 0.30  CREATININE 3.70* 4.30*  --  4.13*  --   --     < > = values in this interval not displayed.     Estimated Creatinine Clearance: 10.7 mL/min (A) (by C-G formula based on SCr of 4.13 mg/dL (H)).   Assessment: 87 yo female with chronic left femoral vein DVT of unknown age on lovenox and noted with AKI.  Last dose lovenox 8/31.  Transitioned to heparin 9/1.  heparin level therapeutic at 0.3, on heparin drip 1300 units/hr.  No line issues or signs/symptoms of bleeding per RN.   Goal of Therapy:  Heparin level 0.3-0.7 units/ml Monitor platelets by anticoagulation protocol: Yes   Plan:  Increase heparin to 1500 units/hr  To keep in goal range  Monitor CBC and for signs/symptoms of bleeding.    11/1 Pharm.D. CPP, BCPS Clinical Pharmacist [phone] 07/12/2021 9:53 PM    Please check AMION for all [facility] Pharmacy phone numbers After 10:00 PM, call Main Pharmacy [phone]

## 2021-07-12 NOTE — Progress Notes (Signed)
ANTICOAGULATION CONSULT NOTE   Pharmacy Consult for Heparin Indication: DVT  Allergies  Allergen Reactions   Clonidine Derivatives Swelling    [name]   Fish Allergy Diarrhea, Swelling and Other (See Comments)    Turns skin "black," but can tolerate white fish Salmon- Diarrhea   Shellfish Allergy Hives   Doxycycline Rash and Other (See Comments)    Dizziness, also   Cetirizine Hcl Itching   Citrus Diarrhea and Other (See Comments)    Indigestion/heartburn, also; "Allergic to oranges" noted on MAR   Indomethacin Other (See Comments)    "Allergic," per MAR   Methyldopa Diarrhea   Morphine And Related Nausea And Vomiting and Other (See Comments)    "Family reports it drops her b/p that she needs iv fluids"   Morphine Sulfate Nausea And Vomiting and Other (See Comments)    "Family reports it drops her b/p that she needs iv fluids"   Pregabalin Nausea Only and Other (See Comments)    Hallucinations, also   Shellfish-Derived Products Itching and Other (See Comments)    Dizziness, also   Strawberry (Diagnostic) Itching   Strawberry Extract Itching and Other (See Comments)    Dizziness, also   Cetirizine Hcl Itching and Rash   Codeine Hives and Itching   Levaquin [Levofloxacin In D5w] Rash   Levofloxacin Rash   Tomato Rash    Patient Measurements: Height: 5\' 4"  (162.6 cm) Weight: 96.5 kg (212 lb 11.2 oz) IBW/kg (Calculated) : 54.7   Vital Signs: Temp: 98.2 F (36.8 C) (09/03 0400) Temp Source: Oral (09/03 0400) BP: 147/84 (09/03 0400) Pulse Rate: 76 (09/03 0400)  Labs: Recent Labs    07/09/21 0829 07/10/21 0258 07/10/21 0258 07/11/21 0602 07/11/21 1822 07/12/21 0454  HGB  --  9.4*   < > 9.3*  --  9.5*  HCT  --  29.4*  --  28.8*  --  28.7*  PLT  --  227  --  224  --  243  HEPARINUNFRC  --   --   --  0.19* 0.20* 0.43  CREATININE 3.51* 3.70*  --  4.30*  --   --    < > = values in this  interval not displayed.     Estimated Creatinine Clearance: 10 mL/min (A) (by C-G formula based on SCr of 4.3 mg/dL (H)).   Assessment: 58 you female with chronic left femoral vein DVT of unknown age on lovenox and now noted with AKI. Transitioned to heparin.  Heparin level therapeutic (0.43) on gtt at 1300 units/hr. No bleeding noted.  Goal of Therapy:  Heparin level 0.3-0.7 units/ml Monitor platelets by anticoagulation protocol: Yes   Plan:  Continue heparin at 1300 units/hr F/u 6 hr heparin level to confirm therapeutic  92, PharmD, BCPS Please see amion for complete clinical pharmacist phone list 07/12/2021 5:40 AM

## 2021-07-13 DIAGNOSIS — Z515 Encounter for palliative care: Secondary | ICD-10-CM

## 2021-07-13 DIAGNOSIS — Z7189 Other specified counseling: Secondary | ICD-10-CM

## 2021-07-13 LAB — CBC
HCT: 27.6 % — ABNORMAL LOW (ref 36.0–46.0)
Hemoglobin: 9.3 g/dL — ABNORMAL LOW (ref 12.0–15.0)
MCH: 31 pg (ref 26.0–34.0)
MCHC: 33.7 g/dL (ref 30.0–36.0)
MCV: 92 fL (ref 80.0–100.0)
Platelets: 233 10*3/uL (ref 150–400)
RBC: 3 MIL/uL — ABNORMAL LOW (ref 3.87–5.11)
RDW: 17.2 % — ABNORMAL HIGH (ref 11.5–15.5)
WBC: 7.3 10*3/uL (ref 4.0–10.5)
nRBC: 0.3 % — ABNORMAL HIGH (ref 0.0–0.2)

## 2021-07-13 LAB — GLUCOSE, CAPILLARY
Glucose-Capillary: 87 mg/dL (ref 70–99)
Glucose-Capillary: 97 mg/dL (ref 70–99)
Glucose-Capillary: 107 mg/dL — ABNORMAL HIGH (ref 70–99)
Glucose-Capillary: 110 mg/dL — ABNORMAL HIGH (ref 70–99)

## 2021-07-13 LAB — RENAL FUNCTION PANEL
Albumin: 2.7 g/dL — ABNORMAL LOW (ref 3.5–5.0)
Anion gap: 13 (ref 5–15)
BUN: 51 mg/dL — ABNORMAL HIGH (ref 8–23)
CO2: 21 mmol/L — ABNORMAL LOW (ref 22–32)
Calcium: 8.2 mg/dL — ABNORMAL LOW (ref 8.9–10.3)
Chloride: 96 mmol/L — ABNORMAL LOW (ref 98–111)
Creatinine, Ser: 3.44 mg/dL — ABNORMAL HIGH (ref 0.44–1.00)
GFR, Estimated: 12 mL/min — ABNORMAL LOW (ref >60)
Glucose, Bld: 88 mg/dL (ref 70–99)
Phosphorus: 5.1 mg/dL — ABNORMAL HIGH (ref 2.5–4.6)
Potassium: 4 mmol/L (ref 3.5–5.1)
Sodium: 130 mmol/L — ABNORMAL LOW (ref 135–145)

## 2021-07-13 LAB — HEPARIN LEVEL (UNFRACTIONATED): Heparin Unfractionated: 0.36 IU/mL (ref 0.30–0.70)

## 2021-07-13 MED ORDER — GERHARDT'S BUTT CREAM
TOPICAL_CREAM | Freq: Four times a day (QID) | CUTANEOUS | Status: DC
  Administered 2021-07-14: 1 via TOPICAL
  Filled 2021-07-13 (×2): qty 1

## 2021-07-13 NOTE — Progress Notes (Addendum)
Palliative Medicine Inpatient Follow Up Note  Consulting Provider: Domenic Polite, MD   Reason for consult:   [place] Palliative Medicine Consult  Reason for Consult? goals of care    HPI:  Per intake H&P --> [name] [name] is an 87 y.o. female past medical history of chronic combined systolic and diastolic heart failure with an EF of 60%, rheumatoid arthritis, essential hypertension CAD PE currently not on anticoagulation comes into the ED for shortness of breath and acute encephalopathy, being treated with diuretics presently.  CT angio of the chest was negative for PE, severe AKI-suspected in the setting of contrast nephropathy.   Palliative care has been asked to get involved to further address goals of care in the setting of multiple co-morbidities and acute illness(s).  Today's Discussion (07/13/2021):  *Please note that this is a verbal dictation therefore any spelling or grammatical errors are due to the "[employer] [employer]" system interpretation.  Chart reviewed.   I met with [name] [name] at bedside this morning. She was resting comfortably in bed. She endorses feelings of tiredness today. She denies pain or nausea. We reviewed the plan for her to transition to hospice when she discharges which she expresses understanding of.  I attempted to call patients daughter, [name] [name] this morning though my call went right to voicemail. I will try again later today.   Through secure chat with [name] [name] and the hospice liaison, [name] [name] it has been identified that patients daughter would like for her to transition to [name]. This will depend on the medical directors review and approval. If not then she will need to transition back to her SNF with Hospice coming in.   Questions and concerns addressed  _____________________________________ Addendum:  I spoke over the phone to patients daughter, [name] [name]. She expresses some confusion about the next steps from here. She  shares that she was told by someone yesterday that [name] [name] would absolutely not be going back to [facility] and they could clear her things out. [name] [name] reviewed that someone, she does not remember who, shared that there will be a hospice bed on Monday. I reviewed with [name] [name] that from my understanding, [name] [name] has not yet been accepted to an inpatient hospice and that is the first step prior to a bed being made available. I shared that I will follow up with the [name] team to further aid in the placement process.   Objective Assessment: Vital Signs Vitals:   07/12/21 2004 07/13/21 0511  BP: (!) 139/94 (!) 155/84  Pulse: 78 71  Resp: 19 17  Temp: 98.3 F (36.8 C) (!) 97.5 F (36.4 C)  SpO2: 100% 100%    Intake/Output Summary (Last 24 hours) at 07/13/2021 0926 Last data filed at 07/13/2021 0510 Gross per 24 hour  Intake 567.35 ml  Output 525 ml  Net 42.35 ml   Last Weight  Most recent update: 07/13/2021  5:12 AM    Weight  100.5 kg (221 lb 9 oz)            Gen:  Very frail elderly F in NAD HEENT: moist mucous membranes CV: Regular rate and rhythm  PULM:  On 1.5LPM [place], (+) crackles ABD: soft/nontender  LPF:[id number] edema Neuro: Alert and oriented x2-3   SUMMARY OF RECOMMENDATIONS   DNAR/DNI   MOST/DNR in [facility]    Appreciate TOC involvement for discharge plan patients daughter would like patient to transition to an inpatient hospice   PMT will remain involved to support patient  and family  Time Spent: 25 Greater than 50% of the time was spent in counseling and coordination of care ______________________________________________________________________________________ [place] Team Team Cell Phone: [phone] Please utilize secure chat with additional questions, if there is no response within 30 minutes please call the above phone number  Palliative Medicine Team providers are available by phone from 7am to 7pm daily and can be  reached through the team cell phone.  Should this patient require assistance outside of these hours, please call the patient's attending physician.

## 2021-07-13 NOTE — Progress Notes (Signed)
ANTICOAGULATION CONSULT NOTE   Pharmacy Consult for Heparin Indication: DVT  Allergies  Allergen Reactions   Clonidine Derivatives Swelling    [name]   Fish Allergy Diarrhea, Swelling and Other (See Comments)    Turns skin "black," but can tolerate white fish Salmon- Diarrhea   Shellfish Allergy Hives   Doxycycline Rash and Other (See Comments)    Dizziness, also   Cetirizine Hcl Itching   Citrus Diarrhea and Other (See Comments)    Indigestion/heartburn, also; "Allergic to oranges" noted on MAR   Indomethacin Other (See Comments)    "Allergic," per MAR   Methyldopa Diarrhea   Morphine And Related Nausea And Vomiting and Other (See Comments)    "Family reports it drops her b/p that she needs iv fluids"   Morphine Sulfate Nausea And Vomiting and Other (See Comments)    "Family reports it drops her b/p that she needs iv fluids"   Pregabalin Nausea Only and Other (See Comments)    Hallucinations, also   Shellfish-Derived Products Itching and Other (See Comments)    Dizziness, also   Strawberry (Diagnostic) Itching   Strawberry Extract Itching and Other (See Comments)    Dizziness, also   Cetirizine Hcl Itching and Rash   Codeine Hives and Itching   Levaquin [Levofloxacin In D5w] Rash   Levofloxacin Rash   Tomato Rash    Patient Measurements: Height: 5\' 4"  (162.6 cm) Weight: 100.5 kg (221 lb 9 oz) IBW/kg (Calculated) : 54.7 Heparin dosing weight: 75.3 kg  Vital Signs: Temp: 97.5 F (36.4 C) (09/04 0511) Temp Source: Oral (09/04 0511) BP: 155/84 (09/04 0511) Pulse Rate: 71 (09/04 0511)  Labs: Recent Labs    07/11/21 0602 07/11/21 1822 07/12/21 0454 07/12/21 1229 07/12/21 2044 07/13/21 0212  HGB 9.3*  --  9.5*  --   --  9.3*  HCT 28.8*  --  28.7*  --   --  27.6*  PLT 224  --  243  --   --  233  HEPARINUNFRC 0.19*   < > 0.43 0.29* 0.30 0.36  CREATININE 4.30*  --  4.13*  --   --   --     < > = values in this interval not displayed.     Estimated Creatinine Clearance: 10.6 mL/min (A) (by C-G formula based on SCr of 4.13 mg/dL (H)).   Assessment: 87 yo female with chronic left femoral vein DVT of unknown age on lovenox and noted with AKI.  Last dose lovenox 8/31.  Transitioned to heparin 9/1.  Heparin level this morning therapeutic at 0.36, on 1500 units/hr.  No line issues or signs/symptoms of bleeding per RN.    Goal of Therapy:  Heparin level 0.3-0.7 units/ml Monitor platelets by anticoagulation protocol: Yes   Plan:  Continue heparin at 1500 units/hr.  Daily heparin level, CBC.  Monitor for signs/symptoms of bleeding.    11/1, PharmD PGY1 Pharmacy Resident Phone [phone] 07/13/2021 7:13 AM   Please check AMION for all [facility] Pharmacy phone numbers After 10:00 PM, call Main Pharmacy [phone]

## 2021-07-13 NOTE — Progress Notes (Signed)
[facility] Progress Note    Assessment/ Plan:    AKI/CKD stage IIIa - likely combination of contrast induced nephropathy as well as cardiorenal syndrome with diuresis.  Torsemide on hold.  UA negative for blood or protein.  No leukocytes.  Renal US without obstruction.  IVF and lasix on hold. Plan remains the same: no renal replacement therapy, palliative care, and possible transition to hospice/comfort care if she continues to decline (which does not seem to be the case right now) Peak Cr 4.3 on 9/2 Stopped IVFs, hold on lasix today. Cr better today, down to 3.4 Appreciate palliative care's assistance. Strict I/O Avoid any further nephrotoxic agents such as phosphorus containing bowel preps (FLEETS), IV contrast, NSAIDs/Cox II I's, avoid baclofen.   Renal dose meds for GFR <10 ml/min Acute on chronic CHF - initially responded to IV lasix but now with AKI and decreased UOP since lasix stopped.  Volume status relatively stable today, recommend having a low threshold for diuretics Metabolic acidosis secondary to #1: started nahco3, improved Left leg DVT - started on lovenox per primary Hypervolemic hyponatremia - due to CHF and AKI.  Na improving Fever and leukocytosis - CT of abd and pelvis without abnormality.  Urine and blood cultures negative to date as well as respiratory panel.  Appears to have resolved. Acute metabolic encephalopathy - improved per daughter but occurring intermittently HTN Rheumatoid arthritis - on chronic prednisone. Anemia of CKD stage IIIa - hold off on ESA given CHF. Right middle lobe nodular opacity seen on CT scan - to f/u with noncontrasted CT in 6 months.  Anthony Sar, MD [facility]   Subjective:   No acute events overnight. No complaints, discussed with daughter at bedside today. Urine output better, up to ~0.7L. Patient wishes to move around more.   Objective:   BP (!) 155/84 (BP Location: Left Arm)   Pulse 71   Temp  (!) 97.5 F (36.4 C) (Oral)   Resp 17   Ht 5\' 4"  (1.626 m)   Wt 100.5 kg   SpO2 100%   BMI 38.03 kg/m   Intake/Output Summary (Last 24 hours) at 07/13/2021 1152 Last data filed at 07/13/2021 0510 Gross per 24 hour  Intake 377.44 ml  Output 525 ml  Net -147.56 ml   Weight change: -0.1 kg  Physical Exam: 09/12/2021 ill appearing, nad CVS:rrr Resp:decreased air entry bibasilar, normal wob [id number] Ext: 1+ edema bl LE's Neuro: awake, alert, no asterixis  Imaging: No results found.  Labs: BMET Recent Labs  Lab 07/07/21 0118 07/08/21 0117 07/09/21 0829 07/10/21 0258 07/11/21 0602 07/12/21 0454 07/13/21 1012  NA 139 136 132* 127* 126* 127* 130*  K 3.9 4.1 5.3* 4.8 4.7 5.2* 4.0  CL 102 103 99 96* 96* 97* 96*  CO2 24 24 19* 18* 17* 16* 21*  GLUCOSE 87 71 176* 162* 82 72 88  BUN 34* 36* 45* 49* 54* 55* 51*  CREATININE 1.37* 2.21* 3.51* 3.70* 4.30* 4.13* 3.44*  CALCIUM 8.8* 8.5* 8.7* 8.3* 8.0* 8.0* 8.2*  PHOS  --   --   --   --   --   --  5.1*   CBC Recent Labs  Lab 07/07/21 0118 07/08/21 1106 07/09/21 0205 07/10/21 0258 07/11/21 0602 07/12/21 0454 07/13/21 0212  WBC 10.9* 7.6 7.2 10.7* 10.2 8.9 7.3  NEUTROABS 8.1* 5.1 6.6  --   --   --   --   HGB 9.7* 9.5* 10.1* 9.4* 9.3* 9.5* 9.3*  HCT 30.6* 30.4* 31.8* 29.4* 28.8* 28.7* 27.6*  MCV 95.6 96.5 95.8 95.5 94.1 93.2 92.0  PLT 221 203 243 227 224 243 233    Medications:     acidophilus  1 capsule Oral Daily   aspirin EC  81 mg Oral QHS   carvedilol  12.5 mg Oral BID WC   hydroxychloroquine  200 mg Oral BID   insulin aspart  0-6 Units Subcutaneous TID WC   leflunomide  20 mg Oral QPM   predniSONE  7.5 mg Oral q morning   senna-docusate  2 tablet Oral Q breakfast   And   senna-docusate  1 tablet Oral BID   sodium bicarbonate  1,300 mg Oral BID    Anthony Sar, MD [facility] 07/13/2021, 11:52 AM

## 2021-07-13 NOTE — Progress Notes (Signed)
[facility] PROGRESS NOTE    Progress Note  [name] [name]  MPN:[id number] DOB: 10/05/1930 DOA: 07/04/2021 PCP: Pcp, No     Brief Narrative:   [name] [name] is an 87 y.o. female past medical history of chronic combined systolic and diastolic heart failure with an EF of 60%, rheumatoid arthritis, essential hypertension CAD PE currently not on anticoagulation comes into the ED for shortness of breath and acute encephalopathy.  CT angio of the chest was negative for PE  -Diuresed this admission -Hospital course complicated by severe AKI-suspected contrast nephropathy   Assessment/Plan:   Acute kidney injury Contrast nephropathy -creatinine bumped from baseline of 1.3 up to 2.2 ->3.5>3.7>4.3>4.1>3.4 -suspect contrast-induced nephropathy, got IV contrast with CTA in the ED on 8/26 midnight and 8/28 for CT abdomen -Diuretics held,  urine output is poor as well, renal ultrasound without hydronephrosis  -Appreciate Nephrology input, felt to have poor prognosis in the setting of worsening renal failure, poor urine output and some uremic symptoms, she was not felt to be a dialysis candidate  -Seen by palliative care as well  -Some improvement in kidney function noted -May be more appropriate for discharge back to SNF with hospice services  Acute on chronic systolic CHF (congestive heart failure) (HCC): -CTA chest was negative for PE on admission, noted to have 2-3+ lower extremity edema initially, diuresed with IV Lasix earlier this admission, then transitioned to oral torsemide -Creatinine worsened significantly, diuretics held  Age indeterminant DVT left leg -Dopplers on admission 8/27 noted chronic left femoral vein DVT -Now on IV heparin in the setting of worsening AKI -?Change to Eliquis tomorrow  Fever and leukocytosis: -Resolved, likely viral illness, COVID PCR was negative, CT chest, urine culture unremarkable, blood cultures are negative, respiratory virus panel  negative  -Afebrile and leukocytosis has resolved now  Acute metabolic encephalopathy: -resolved  Hypertension: -Continue Coreg, amlodipine on hold, hold torsemide   Rheumatoid arthritis involving both knees (HCC): -Chronic prednisone continue 7.5 mg daily  Normocytic anemia: -Stable, monitor  History of CAD, Denies any chest pain.  History of diabetes mellitus type 2: A1c of 5.8 continue sliding scale insulin   DVT prophylaxis: Heparin CODE STATUS: DNR Family Communication: Discussed with daughter Para March at bedside yesterday Status is: Inpatient inpatient because: Hemodynamically unstable  Dispo: The patient is from: SNF              Anticipated d/c is to: SNF              Patient currently is not medically stable to d/c.   Difficult to place patient No    Subjective:  -Continues to have poor appetite, no nausea or vomiting, denies any dyspnea  Objective:    Vitals:   07/12/21 0600 07/12/21 1342 07/12/21 2004 07/13/21 0511  BP:  (!) 105/94 (!) 139/94 (!) 155/84  Pulse:  75 78 71  Resp:  19 19 17   Temp:  (!) 97.5 F (36.4 C) 98.3 F (36.8 C) (!) 97.5 F (36.4 C)  TempSrc:  Oral Oral Oral  SpO2:  100% 100% 100%  Weight: 100.6 kg   100.5 kg  Height:       SpO2: 100 % O2 Flow Rate (L/min): 1 L/min   Intake/Output Summary (Last 24 hours) at 07/13/2021 1151 Last data filed at 07/13/2021 0510 Gross per 24 hour  Intake 377.44 ml  Output 525 ml  Net -147.56 ml   Filed Weights   07/10/21 0452 07/12/21 0600 07/13/21 0511  Weight: 96.5  kg 100.6 kg 100.5 kg    Exam: Obese pleasant female sitting up in bed, AAO x2, no distress, appears comfortable CVS: S1-S2, regular rate rhythm Lungs: Few basilar rales, poor air movement Abdomen: Soft, obese, nontender, bowel sounds present Extremities: No edema Skin: No rashes on exposed skin  Neuro: Generalized lower extremity weakness, disuse atrophy  Data Reviewed:    Labs: Basic Metabolic Panel: Recent Labs   Lab 07/07/21 0118 07/08/21 0117 07/09/21 0829 07/10/21 0258 07/11/21 0602 07/12/21 0454 07/13/21 1012  NA 139   < > 132* 127* 126* 127* 130*  K 3.9   < > 5.3* 4.8 4.7 5.2* 4.0  CL 102   < > 99 96* 96* 97* 96*  CO2 24   < > 19* 18* 17* 16* 21*  GLUCOSE 87   < > 176* 162* 82 72 88  BUN 34*   < > 45* 49* 54* 55* 51*  CREATININE 1.37*   < > 3.51* 3.70* 4.30* 4.13* 3.44*  CALCIUM 8.8*   < > 8.7* 8.3* 8.0* 8.0* 8.2*  MG 2.0  --   --   --   --   --   --   PHOS  --   --   --   --   --   --  5.1*   < > = values in this interval not displayed.   GFR Estimated Creatinine Clearance: 12.8 mL/min (A) (by C-G formula based on SCr of 3.44 mg/dL (H)). Liver Function Tests: Recent Labs  Lab 07/13/21 1012  ALBUMIN 2.7*    No results for input(s): LIPASE, AMYLASE in the last 168 hours. No results for input(s): AMMONIA in the last 168 hours. Coagulation profile No results for input(s): INR, PROTIME in the last 168 hours.  COVID-19 Labs  No results for input(s): DDIMER, FERRITIN, LDH, CRP in the last 72 hours.  Lab Results  Component Value Date   SARSCOV2NAA NEGATIVE 07/04/2021   SARSCOV2NAA NEGATIVE 03/26/2021   SARSCOV2NAA NEGATIVE 12/13/2019   SARSCOV2NAA POSITIVE (A) 10/26/2019    CBC: Recent Labs  Lab 07/07/21 0118 07/08/21 1106 07/09/21 0205 07/10/21 0258 07/11/21 0602 07/12/21 0454 07/13/21 0212  WBC 10.9* 7.6 7.2 10.7* 10.2 8.9 7.3  NEUTROABS 8.1* 5.1 6.6  --   --   --   --   HGB 9.7* 9.5* 10.1* 9.4* 9.3* 9.5* 9.3*  HCT 30.6* 30.4* 31.8* 29.4* 28.8* 28.7* 27.6*  MCV 95.6 96.5 95.8 95.5 94.1 93.2 92.0  PLT 221 203 243 227 224 243 233   Cardiac Enzymes: No results for input(s): CKTOTAL, CKMB, CKMBINDEX, TROPONINI in the last 168 hours. BNP (last 3 results) No results for input(s): PROBNP in the last 8760 hours. CBG: Recent Labs  Lab 07/12/21 0755 07/12/21 1127 07/12/21 1659 07/12/21 2107 07/13/21 0744  GLUCAP 81 92 125* 110* 87   D-Dimer: No results for  input(s): DDIMER in the last 72 hours. Hgb A1c: No results for input(s): HGBA1C in the last 72 hours. Lipid Profile: No results for input(s): CHOL, HDL, LDLCALC, TRIG, CHOLHDL, LDLDIRECT in the last 72 hours. Thyroid function studies: No results for input(s): TSH, T4TOTAL, T3FREE, THYROIDAB in the last 72 hours.  Invalid input(s): FREET3 Anemia work up: No results for input(s): VITAMINB12, FOLATE, FERRITIN, TIBC, IRON, RETICCTPCT in the last 72 hours. Sepsis Labs: Recent Labs  Lab 07/07/21 0118 07/08/21 0117 07/08/21 1106 07/10/21 0258 07/11/21 0602 07/12/21 0454 07/13/21 0212  PROCALCITON 0.23 0.26  --   --   --   --   --  WBC 10.9*  --    < > 10.7* 10.2 8.9 7.3   < > = values in this interval not displayed.   Microbiology Recent Results (from the past 240 hour(s))  Blood Culture (routine x 2)     Status: None   Collection Time: 07/04/21  7:50 PM   Specimen: BLOOD  Result Value Ref Range Status   Specimen Description BLOOD LEFT ARM  Final   Special Requests   Final    BOTTLES DRAWN AEROBIC AND ANAEROBIC Blood Culture results may not be optimal due to an excessive volume of blood received in culture bottles   Culture   Final    NO GROWTH 5 DAYS Performed at [facility], [address] [place], [place] [address]    Report Status 07/09/2021 FINAL  Final  Resp Panel by RT-PCR (Flu A&B, Covid) Nasopharyngeal Swab     Status: None   Collection Time: 07/04/21  9:16 PM   Specimen: Nasopharyngeal Swab; Nasopharyngeal(NP) swabs in vial transport medium  Result Value Ref Range Status   SARS Coronavirus 2 by RT PCR NEGATIVE NEGATIVE Final    Comment: (NOTE) SARS-CoV-2 target nucleic acids are NOT DETECTED.  The SARS-CoV-2 RNA is generally detectable in upper respiratory specimens during the acute phase of infection. The lowest concentration of SARS-CoV-2 viral copies this assay can detect is 138 copies/mL. A negative result does not preclude SARS-Cov-2 infection and  should not be used as the sole basis for treatment or other patient management decisions. A negative result may occur with  improper specimen collection/handling, submission of specimen other than nasopharyngeal swab, presence of viral mutation(s) within the areas targeted by this assay, and inadequate number of viral copies(<138 copies/mL). A negative result must be combined with clinical observations, patient history, and epidemiological information. The expected result is Negative.  Fact Sheet for Patients:  BloggerCourse.com  Fact Sheet for Healthcare Providers:  SeriousBroker.it  This test is no t yet approved or cleared by the [place] FDA and  has been authorized for detection and/or diagnosis of SARS-CoV-2 by FDA under an Emergency Use Authorization (EUA). This EUA will remain  in effect (meaning this test can be used) for the duration of the COVID-19 declaration under Section 564(b)(1) of the Act, 21 U.S.C.section 360bbb-3(b)(1), unless the authorization is terminated  or revoked sooner.       Influenza A by PCR NEGATIVE NEGATIVE Final   Influenza B by PCR NEGATIVE NEGATIVE Final    Comment: (NOTE) The Xpert Xpress SARS-CoV-2/FLU/RSV plus assay is intended as an aid in the diagnosis of influenza from Nasopharyngeal swab specimens and should not be used as a sole basis for treatment. Nasal washings and aspirates are unacceptable for Xpert Xpress SARS-CoV-2/FLU/RSV testing.  Fact Sheet for Patients: BloggerCourse.com  Fact Sheet for Healthcare Providers: SeriousBroker.it  This test is not yet approved or cleared by the [place] FDA and has been authorized for detection and/or diagnosis of SARS-CoV-2 by FDA under an Emergency Use Authorization (EUA). This EUA will remain in effect (meaning this test can be used) for the duration of the COVID-19 declaration  under Section 564(b)(1) of the Act, 21 U.S.C. section 360bbb-3(b)(1), unless the authorization is terminated or revoked.  Performed at [facility], [address], [place], [place] [address]   Blood Culture (routine x 2)     Status: None   Collection Time: 07/05/21 12:06 AM   Specimen: BLOOD  Result Value Ref Range Status   Specimen  Description BLOOD RIGHT ARM  Final   Special Requests   Final    BOTTLES DRAWN AEROBIC AND ANAEROBIC Blood Culture adequate volume   Culture   Final    NO GROWTH 5 DAYS Performed at [facility], [address], [place], [place] [address]    Report Status 07/10/2021 FINAL  Final  Urine Culture     Status: Abnormal   Collection Time: 07/05/21  4:24 AM   Specimen: In/Out Cath Urine  Result Value Ref Range Status   Specimen Description IN/OUT CATH URINE  Final   Special Requests   Final    NONE Performed at [facility], [address], [place], [place] [address]    Culture (A)  Final    2,000 COLONIES/mL ESCHERICHIA COLI Confirmed Extended Spectrum Beta-Lactamase Producer (ESBL).  In bloodstream infections from ESBL organisms, carbapenems are preferred over piperacillin/tazobactam. They are shown to have a lower risk of mortality. 1,000 COLONIES/mL ENTEROCOCCUS FAECALIS    Report Status 07/08/2021 FINAL  Final   Organism ID, Bacteria ESCHERICHIA COLI (A)  Final   Organism ID, Bacteria ENTEROCOCCUS FAECALIS (A)  Final      Susceptibility   Escherichia coli - MIC*    AMPICILLIN >=32 RESISTANT Resistant     CEFAZOLIN >=64 RESISTANT Resistant     CEFEPIME 2 SENSITIVE Sensitive     CEFTRIAXONE >=64 RESISTANT Resistant     CIPROFLOXACIN >=4 RESISTANT Resistant     GENTAMICIN >=16 RESISTANT Resistant     IMIPENEM <=0.25 SENSITIVE Sensitive     NITROFURANTOIN <=16 SENSITIVE Sensitive     TRIMETH/SULFA <=20 SENSITIVE Sensitive     AMPICILLIN/SULBACTAM >=32 RESISTANT Resistant     PIP/TAZO <=4 SENSITIVE Sensitive     * 2,000  COLONIES/mL ESCHERICHIA COLI   Enterococcus faecalis - MIC*    AMPICILLIN <=2 SENSITIVE Sensitive     NITROFURANTOIN <=16 SENSITIVE Sensitive     VANCOMYCIN 1 SENSITIVE Sensitive     * 1,000 COLONIES/mL ENTEROCOCCUS FAECALIS  Respiratory (~20 pathogens) panel by PCR     Status: None   Collection Time: 07/06/21 12:20 PM   Specimen: Nasopharyngeal Swab; Respiratory  Result Value Ref Range Status   Adenovirus NOT DETECTED NOT DETECTED Final   Coronavirus 229E NOT DETECTED NOT DETECTED Final    Comment: (NOTE) The Coronavirus on the Respiratory Panel, DOES NOT test for the novel  Coronavirus (2019 nCoV)    Coronavirus HKU1 NOT DETECTED NOT DETECTED Final   Coronavirus NL63 NOT DETECTED NOT DETECTED Final   Coronavirus OC43 NOT DETECTED NOT DETECTED Final   Metapneumovirus NOT DETECTED NOT DETECTED Final   Rhinovirus / Enterovirus NOT DETECTED NOT DETECTED Final   Influenza A NOT DETECTED NOT DETECTED Final   Influenza B NOT DETECTED NOT DETECTED Final   Parainfluenza Virus 1 NOT DETECTED NOT DETECTED Final   Parainfluenza Virus 2 NOT DETECTED NOT DETECTED Final   Parainfluenza Virus 3 NOT DETECTED NOT DETECTED Final   Parainfluenza Virus 4 NOT DETECTED NOT DETECTED Final   Respiratory Syncytial Virus NOT DETECTED NOT DETECTED Final   Bordetella pertussis NOT DETECTED NOT DETECTED Final   Bordetella Parapertussis NOT DETECTED NOT DETECTED Final   Chlamydophila pneumoniae NOT DETECTED NOT DETECTED Final   Mycoplasma pneumoniae NOT DETECTED NOT DETECTED Final    Comment: Performed at [facility], [address], [place], [place] [address]     Medications:    acidophilus  1 capsule Oral Daily   aspirin EC  81 mg Oral QHS   carvedilol  12.5 mg Oral BID WC   hydroxychloroquine  200 mg Oral BID   insulin aspart  0-6 Units Subcutaneous TID WC   leflunomide  20 mg Oral QPM   predniSONE  7.5 mg Oral q morning   senna-docusate  2 tablet Oral Q breakfast   And   senna-docusate   1 tablet Oral BID   sodium bicarbonate  1,300 mg Oral BID   Continuous Infusions:  heparin 1,500 Units/hr (07/13/21 0000)      LOS: 8 days   [name] [name]  [facility]  07/13/2021, 11:51 AM

## 2021-07-14 LAB — CBC
HCT: 26.8 % — ABNORMAL LOW (ref 36.0–46.0)
Hemoglobin: 8.9 g/dL — ABNORMAL LOW (ref 12.0–15.0)
MCH: 30.4 pg (ref 26.0–34.0)
MCHC: 33.2 g/dL (ref 30.0–36.0)
MCV: 91.5 fL (ref 80.0–100.0)
Platelets: 242 10*3/uL (ref 150–400)
RBC: 2.93 MIL/uL — ABNORMAL LOW (ref 3.87–5.11)
RDW: 17.3 % — ABNORMAL HIGH (ref 11.5–15.5)
WBC: 7.4 10*3/uL (ref 4.0–10.5)
nRBC: 0 % (ref 0.0–0.2)

## 2021-07-14 LAB — BASIC METABOLIC PANEL
Anion gap: 10 (ref 5–15)
BUN: 48 mg/dL — ABNORMAL HIGH (ref 8–23)
CO2: 19 mmol/L — ABNORMAL LOW (ref 22–32)
Calcium: 8.4 mg/dL — ABNORMAL LOW (ref 8.9–10.3)
Chloride: 101 mmol/L (ref 98–111)
Creatinine, Ser: 3.01 mg/dL — ABNORMAL HIGH (ref 0.44–1.00)
GFR, Estimated: 14 mL/min — ABNORMAL LOW (ref >60)
Glucose, Bld: 90 mg/dL (ref 70–99)
Potassium: 4.1 mmol/L (ref 3.5–5.1)
Sodium: 130 mmol/L — ABNORMAL LOW (ref 135–145)

## 2021-07-14 LAB — HEPARIN LEVEL (UNFRACTIONATED): Heparin Unfractionated: 0.18 IU/mL — ABNORMAL LOW (ref 0.30–0.70)

## 2021-07-14 LAB — GLUCOSE, CAPILLARY
Glucose-Capillary: 99 mg/dL (ref 70–99)
Glucose-Capillary: 104 mg/dL — ABNORMAL HIGH (ref 70–99)
Glucose-Capillary: 112 mg/dL — ABNORMAL HIGH (ref 70–99)
Glucose-Capillary: 133 mg/dL — ABNORMAL HIGH (ref 70–99)

## 2021-07-14 MED ORDER — APIXABAN 5 MG PO TABS
5.0000 mg | ORAL_TABLET | Freq: Two times a day (BID) | ORAL | Status: DC
  Administered 2021-07-14 – 2021-07-16 (×5): 5 mg via ORAL
  Filled 2021-07-14 (×5): qty 1

## 2021-07-14 MED ORDER — TORSEMIDE 20 MG PO TABS
20.0000 mg | ORAL_TABLET | Freq: Every day | ORAL | Status: DC
  Administered 2021-07-15 – 2021-07-16 (×2): 20 mg via ORAL
  Filled 2021-07-14 (×2): qty 1

## 2021-07-14 MED ORDER — HYDROMORPHONE HCL 1 MG/ML IJ SOLN
0.5000 mg | INTRAMUSCULAR | Status: AC | PRN
  Administered 2021-07-14 – 2021-07-15 (×2): 0.5 mg via INTRAVENOUS
  Filled 2021-07-14 (×2): qty 1

## 2021-07-14 NOTE — Progress Notes (Signed)
[facility] PROGRESS NOTE    Progress Note  [name] [name]  OBS:[id number] DOB: 26-Jul-1930 DOA: 07/04/2021 PCP: Pcp, No     Brief Narrative:   [name] [name] is an 87 y.o. female past medical history of chronic combined systolic and diastolic heart failure with an EF of 60%, rheumatoid arthritis, essential hypertension CAD PE currently not on anticoagulation comes into the ED for shortness of breath and acute encephalopathy.  CT angio of the chest was negative for PE  -Diuresed this admission -Hospital course complicated by severe AKI-suspected contrast nephropathy   Assessment/Plan:   Acute kidney injury Contrast nephropathy -creatinine bumped from baseline of 1.3 up to 2.2 ->3.5>3.7>4.3>4.1>3.4 -suspect contrast-induced nephropathy, got IV contrast with CTA in the ED on 8/26 midnight and 8/28 for CT abdomen -Diuretics held,  urine output is poor as well, renal ultrasound without hydronephrosis  -Appreciate Nephrology input, felt to have poor prognosis in the setting of worsening renal failure, poor urine output and some uremic symptoms, she was not felt to be a dialysis candidate  -Seen by palliative care as well  -Note slow improvement in kidney function and urine output, however remains debilitated with poor p.o. intake -Anticipate need for SNF with hospice services if possible -TOC following  Acute on chronic systolic CHF (congestive heart failure) (HCC): -CTA chest was negative for PE on admission, noted to have 2-3+ lower extremity edema initially, diuresed with IV Lasix earlier this admission, then transitioned to oral torsemide -Creatinine worsened significantly, diuretics held  Age indeterminant DVT left leg -Dopplers on admission 8/27 noted chronic left femoral vein DVT -Treated with IV heparin in the setting of worsening AKI -Changed to Eliquis today  Fever and leukocytosis: -Resolved, likely viral illness, COVID PCR was negative, CT chest, urine culture  unremarkable, blood cultures are negative, respiratory virus panel negative  -Afebrile and leukocytosis has resolved now  Acute metabolic encephalopathy: -resolved  Hypertension: -Continue Coreg, amlodipine on hold, hold torsemide   Rheumatoid arthritis involving both knees (HCC): -Chronic prednisone continue 7.5 mg daily  Normocytic anemia: -Stable, monitor  History of CAD, Denies any chest pain.  History of diabetes mellitus type 2: A1c of 5.8 continue sliding scale insulin   DVT prophylaxis: Apixaban CODE STATUS: DNR Family Communication: No family at the bedside, discussed with daughter few days ago Status is: Inpatient inpatient because: Hemodynamically unstable  Dispo: The patient is from: SNF              Anticipated d/c is to: SNF with hospice care              Patient currently is not medically stable to d/c.   Difficult to place patient No    Subjective:  -Continues to have poor appetite, no nausea or vomiting, denies any dyspnea  Objective:    Vitals:   07/13/21 1703 07/13/21 2058 07/14/21 0519 07/14/21 1004  BP: (!) 121/97 (!) 160/87 (!) 158/65 (!) 166/94  Pulse: (!) 140 74 70 72  Resp: 16 19 16 14   Temp: 98.1 F (36.7 C) 98.4 F (36.9 C) 98.6 F (37 C)   TempSrc: Axillary Oral Oral   SpO2: 100% 100% 100% 100%  Weight:   101.5 kg   Height:       SpO2: 100 % O2 Flow Rate (L/min): 1 L/min   Intake/Output Summary (Last 24 hours) at 07/14/2021 1144 Last data filed at 07/14/2021 0700 Gross per 24 hour  Intake 264.67 ml  Output 850 ml  Net -585.33 ml  Filed Weights   07/12/21 0600 07/13/21 0511 07/14/21 0519  Weight: 100.6 kg 100.5 kg 101.5 kg    Exam: Obese pleasant female sitting up in bed, uncomfortable appearing, AAO x2, no distress CVS: S1-S2, regular rate rhythm Lungs: Few basilar rales, poor air movement Abdomen: Soft, nontender, bowel sounds present Extremities: Trace edema  Skin: No rashes on exposed skin  Neuro: Generalized  lower extremity weakness, disuse atrophy  Data Reviewed:    Labs: Basic Metabolic Panel: Recent Labs  Lab 07/10/21 0258 07/11/21 0602 07/12/21 0454 07/13/21 1012 07/14/21 0119  NA 127* 126* 127* 130* 130*  K 4.8 4.7 5.2* 4.0 4.1  CL 96* 96* 97* 96* 101  CO2 18* 17* 16* 21* 19*  GLUCOSE 162* 82 72 88 90  BUN 49* 54* 55* 51* 48*  CREATININE 3.70* 4.30* 4.13* 3.44* 3.01*  CALCIUM 8.3* 8.0* 8.0* 8.2* 8.4*  PHOS  --   --   --  5.1*  --    GFR Estimated Creatinine Clearance: 14.7 mL/min (A) (by C-G formula based on SCr of 3.01 mg/dL (H)). Liver Function Tests: Recent Labs  Lab 07/13/21 1012  ALBUMIN 2.7*    No results for input(s): LIPASE, AMYLASE in the last 168 hours. No results for input(s): AMMONIA in the last 168 hours. Coagulation profile No results for input(s): INR, PROTIME in the last 168 hours.  COVID-19 Labs  No results for input(s): DDIMER, FERRITIN, LDH, CRP in the last 72 hours.  Lab Results  Component Value Date   SARSCOV2NAA NEGATIVE 07/04/2021   SARSCOV2NAA NEGATIVE 03/26/2021   SARSCOV2NAA NEGATIVE 12/13/2019   SARSCOV2NAA POSITIVE (A) 10/26/2019    CBC: Recent Labs  Lab 07/08/21 1106 07/09/21 0205 07/10/21 0258 07/11/21 0602 07/12/21 0454 07/13/21 0212 07/14/21 0119  WBC 7.6 7.2 10.7* 10.2 8.9 7.3 7.4  NEUTROABS 5.1 6.6  --   --   --   --   --   HGB 9.5* 10.1* 9.4* 9.3* 9.5* 9.3* 8.9*  HCT 30.4* 31.8* 29.4* 28.8* 28.7* 27.6* 26.8*  MCV 96.5 95.8 95.5 94.1 93.2 92.0 91.5  PLT 203 243 227 224 243 233 242   Cardiac Enzymes: No results for input(s): CKTOTAL, CKMB, CKMBINDEX, TROPONINI in the last 168 hours. BNP (last 3 results) No results for input(s): PROBNP in the last 8760 hours. CBG: Recent Labs  Lab 07/13/21 0744 07/13/21 1231 07/13/21 1601 07/13/21 2056 07/14/21 0738  GLUCAP 87 97 107* 110* 99   D-Dimer: No results for input(s): DDIMER in the last 72 hours. Hgb A1c: No results for input(s): HGBA1C in the last 72  hours. Lipid Profile: No results for input(s): CHOL, HDL, LDLCALC, TRIG, CHOLHDL, LDLDIRECT in the last 72 hours. Thyroid function studies: No results for input(s): TSH, T4TOTAL, T3FREE, THYROIDAB in the last 72 hours.  Invalid input(s): FREET3 Anemia work up: No results for input(s): VITAMINB12, FOLATE, FERRITIN, TIBC, IRON, RETICCTPCT in the last 72 hours. Sepsis Labs: Recent Labs  Lab 07/08/21 0117 07/08/21 1106 07/11/21 0602 07/12/21 0454 07/13/21 0212 07/14/21 0119  PROCALCITON 0.26  --   --   --   --   --   WBC  --    < > 10.2 8.9 7.3 7.4   < > = values in this interval not displayed.   Microbiology Recent Results (from the past 240 hour(s))  Blood Culture (routine x 2)     Status: None   Collection Time: 07/04/21  7:50 PM   Specimen: BLOOD  Result Value Ref Range Status  Specimen Description BLOOD LEFT ARM  Final   Special Requests   Final    BOTTLES DRAWN AEROBIC AND ANAEROBIC Blood Culture results may not be optimal due to an excessive volume of blood received in culture bottles   Culture   Final    NO GROWTH 5 DAYS Performed at [name], [address], [place], [place] [address]    Report Status 07/09/2021 FINAL  Final  Resp Panel by RT-PCR (Flu A&B, Covid) Nasopharyngeal Swab     Status: None   Collection Time: 07/04/21  9:16 PM   Specimen: Nasopharyngeal Swab; Nasopharyngeal(NP) swabs in vial transport medium  Result Value Ref Range Status   SARS Coronavirus 2 by RT PCR NEGATIVE NEGATIVE Final    Comment: (NOTE) SARS-CoV-2 target nucleic acids are NOT DETECTED.  The SARS-CoV-2 RNA is generally detectable in upper respiratory specimens during the acute phase of infection. The lowest concentration of SARS-CoV-2 viral copies this assay can detect is 138 copies/mL. A negative result does not preclude SARS-Cov-2 infection and should not be used as the sole basis for treatment or other patient management decisions. A negative result may occur with   improper specimen collection/handling, submission of specimen other than nasopharyngeal swab, presence of viral mutation(s) within the areas targeted by this assay, and inadequate number of viral copies(<138 copies/mL). A negative result must be combined with clinical observations, patient history, and epidemiological information. The expected result is Negative.  Fact Sheet for Patients:  BloggerCourse.com  Fact Sheet for Healthcare Providers:  SeriousBroker.it  This test is no t yet approved or cleared by the [place] FDA and  has been authorized for detection and/or diagnosis of SARS-CoV-2 by FDA under an Emergency Use Authorization (EUA). This EUA will remain  in effect (meaning this test can be used) for the duration of the COVID-19 declaration under Section 564(b)(1) of the Act, 21 U.S.C.section 360bbb-3(b)(1), unless the authorization is terminated  or revoked sooner.       Influenza A by PCR NEGATIVE NEGATIVE Final   Influenza B by PCR NEGATIVE NEGATIVE Final    Comment: (NOTE) The Xpert Xpress SARS-CoV-2/FLU/RSV plus assay is intended as an aid in the diagnosis of influenza from Nasopharyngeal swab specimens and should not be used as a sole basis for treatment. Nasal washings and aspirates are unacceptable for Xpert Xpress SARS-CoV-2/FLU/RSV testing.  Fact Sheet for Patients: BloggerCourse.com  Fact Sheet for Healthcare Providers: SeriousBroker.it  This test is not yet approved or cleared by the [place] FDA and has been authorized for detection and/or diagnosis of SARS-CoV-2 by FDA under an Emergency Use Authorization (EUA). This EUA will remain in effect (meaning this test can be used) for the duration of the COVID-19 declaration under Section 564(b)(1) of the Act, 21 U.S.C. section 360bbb-3(b)(1), unless the authorization is terminated  or revoked.  Performed at [facility], [address], [place], [place] [address]   Blood Culture (routine x 2)     Status: None   Collection Time: 07/05/21 12:06 AM   Specimen: BLOOD  Result Value Ref Range Status   Specimen Description BLOOD RIGHT ARM  Final   Special Requests   Final    BOTTLES DRAWN AEROBIC AND ANAEROBIC Blood Culture adequate volume   Culture   Final    NO GROWTH 5 DAYS Performed at [facility], [address], [place], [place] [address]    Report Status 07/10/2021 FINAL  Final  Urine Culture  Status: Abnormal   Collection Time: 07/05/21  4:24 AM   Specimen: In/Out Cath Urine  Result Value Ref Range Status   Specimen Description IN/OUT CATH URINE  Final   Special Requests   Final    NONE Performed at [facility], [address], [place], [place] [address]    Culture (A)  Final    2,000 COLONIES/mL ESCHERICHIA COLI Confirmed Extended Spectrum Beta-Lactamase Producer (ESBL).  In bloodstream infections from ESBL organisms, carbapenems are preferred over piperacillin/tazobactam. They are shown to have a lower risk of mortality. 1,000 COLONIES/mL ENTEROCOCCUS FAECALIS    Report Status 07/08/2021 FINAL  Final   Organism ID, Bacteria ESCHERICHIA COLI (A)  Final   Organism ID, Bacteria ENTEROCOCCUS FAECALIS (A)  Final      Susceptibility   Escherichia coli - MIC*    AMPICILLIN >=32 RESISTANT Resistant     CEFAZOLIN >=64 RESISTANT Resistant     CEFEPIME 2 SENSITIVE Sensitive     CEFTRIAXONE >=64 RESISTANT Resistant     CIPROFLOXACIN >=4 RESISTANT Resistant     GENTAMICIN >=16 RESISTANT Resistant     IMIPENEM <=0.25 SENSITIVE Sensitive     NITROFURANTOIN <=16 SENSITIVE Sensitive     TRIMETH/SULFA <=20 SENSITIVE Sensitive     AMPICILLIN/SULBACTAM >=32 RESISTANT Resistant     PIP/TAZO <=4 SENSITIVE Sensitive     * 2,000 COLONIES/mL ESCHERICHIA COLI   Enterococcus faecalis - MIC*    AMPICILLIN <=2 SENSITIVE Sensitive      NITROFURANTOIN <=16 SENSITIVE Sensitive     VANCOMYCIN 1 SENSITIVE Sensitive     * 1,000 COLONIES/mL ENTEROCOCCUS FAECALIS  Respiratory (~20 pathogens) panel by PCR     Status: None   Collection Time: 07/06/21 12:20 PM   Specimen: Nasopharyngeal Swab; Respiratory  Result Value Ref Range Status   Adenovirus NOT DETECTED NOT DETECTED Final   Coronavirus 229E NOT DETECTED NOT DETECTED Final    Comment: (NOTE) The Coronavirus on the Respiratory Panel, DOES NOT test for the novel  Coronavirus (2019 nCoV)    Coronavirus HKU1 NOT DETECTED NOT DETECTED Final   Coronavirus NL63 NOT DETECTED NOT DETECTED Final   Coronavirus OC43 NOT DETECTED NOT DETECTED Final   Metapneumovirus NOT DETECTED NOT DETECTED Final   Rhinovirus / Enterovirus NOT DETECTED NOT DETECTED Final   Influenza A NOT DETECTED NOT DETECTED Final   Influenza B NOT DETECTED NOT DETECTED Final   Parainfluenza Virus 1 NOT DETECTED NOT DETECTED Final   Parainfluenza Virus 2 NOT DETECTED NOT DETECTED Final   Parainfluenza Virus 3 NOT DETECTED NOT DETECTED Final   Parainfluenza Virus 4 NOT DETECTED NOT DETECTED Final   Respiratory Syncytial Virus NOT DETECTED NOT DETECTED Final   Bordetella pertussis NOT DETECTED NOT DETECTED Final   Bordetella Parapertussis NOT DETECTED NOT DETECTED Final   Chlamydophila pneumoniae NOT DETECTED NOT DETECTED Final   Mycoplasma pneumoniae NOT DETECTED NOT DETECTED Final    Comment: Performed at [facility], [address] [place], [place] [address]     Medications:    acidophilus  1 capsule Oral Daily   apixaban  5 mg Oral BID   aspirin EC  81 mg Oral QHS   carvedilol  12.5 mg Oral BID WC   Gerhardt's butt cream   Topical QID   hydroxychloroquine  200 mg Oral BID   insulin aspart  0-6 Units Subcutaneous TID WC   leflunomide  20 mg Oral QPM   predniSONE  7.5 mg Oral q morning  senna-docusate  2 tablet Oral Q breakfast   And   senna-docusate  1 tablet Oral BID   sodium bicarbonate   1,300 mg Oral BID   Continuous Infusions:      LOS: 9 days   Zannie Cove  [facility]  07/14/2021, 11:44 AM

## 2021-07-14 NOTE — Progress Notes (Addendum)
ANTICOAGULATION CONSULT NOTE   Pharmacy Consult for Apixaban Indication: DVT  Allergies  Allergen Reactions   Clonidine Derivatives Swelling    [name]   Fish Allergy Diarrhea, Swelling and Other (See Comments)    Turns skin "black," but can tolerate white fish Salmon- Diarrhea   Shellfish Allergy Hives   Doxycycline Rash and Other (See Comments)    Dizziness, also   Cetirizine Hcl Itching   Citrus Diarrhea and Other (See Comments)    Indigestion/heartburn, also; "Allergic to oranges" noted on MAR   Indomethacin Other (See Comments)    "Allergic," per MAR   Methyldopa Diarrhea   Morphine And Related Nausea And Vomiting and Other (See Comments)    "Family reports it drops her b/p that she needs iv fluids"   Morphine Sulfate Nausea And Vomiting and Other (See Comments)    "Family reports it drops her b/p that she needs iv fluids"   Pregabalin Nausea Only and Other (See Comments)    Hallucinations, also   Shellfish-Derived Products Itching and Other (See Comments)    Dizziness, also   Strawberry (Diagnostic) Itching   Strawberry Extract Itching and Other (See Comments)    Dizziness, also   Cetirizine Hcl Itching and Rash   Codeine Hives and Itching   Levaquin [Levofloxacin In D5w] Rash   Levofloxacin Rash   Tomato Rash    Patient Measurements: Height: 5\' 4"  (162.6 cm) Weight: 101.5 kg (223 lb 12.3 oz) IBW/kg (Calculated) : 54.7 Heparin dosing weight: 75.3 kg  Vital Signs: Temp: 98.6 F (37 C) (09/05 0519) Temp Source: Oral (09/05 0519) BP: 158/65 (09/05 0519) Pulse Rate: 70 (09/05 0519)  Labs: Recent Labs    07/12/21 0454 07/12/21 1229 07/12/21 2044 07/13/21 0212 07/13/21 1012 07/14/21 0119  HGB 9.5*  --   --  9.3*  --  8.9*  HCT 28.7*  --   --  27.6*  --  26.8*  PLT 243  --   --  233  --  242  HEPARINUNFRC 0.43   < > 0.30 0.36  --  0.18*  CREATININE 4.13*  --   --   --  3.44* 3.01*    < > = values in this interval not displayed.     Estimated Creatinine Clearance: 14.7 mL/min (A) (by C-G formula based on SCr of 3.01 mg/dL (H)).   Assessment: 87 yo female with chronic left femoral vein DVT of unknown age on lovenox and noted with AKI.  Last dose lovenox 8/31.  Patient transitioned to IV heparin 9/1.  IV heparin discontinued 9/5 AM.  Pharmacy consulted for apixaban dosing.  Scr continuing to trend down.  CBC stable.  No signs/symptoms of bleeding noted per RN.    Per MD, since patient has already received ~5 days of anticoagulation with lovenox/IV heparin, will start apixaban at 5 mg PO BID for DVT treatment.   Goal of Therapy:  Monitor platelets by anticoagulation protocol: Yes   Plan:  Apixaban 5 mg PO BID. Monitor CBC and for signs/symptoms of bleeding.    11/5, PharmD PGY1 Pharmacy Resident Phone [phone] 07/14/2021 6:46 AM   Please check AMION for all [facility] Pharmacy phone numbers After 10:00 PM, call Main Pharmacy [phone]

## 2021-07-14 NOTE — TOC Progression Note (Addendum)
Transition of Care [facility]) - Progression Note    Patient Details  Name: [name] [name] MRN: [id number] Date of Birth: 07-Mar-1930  Transition of [facility]) CM/SW Contact  Terrial Rhodes, LCSWA Phone Number: 07/14/2021, 11:13 AM  Clinical Narrative:     CSW spoke with [facility] with [facility]. Skeet Simmer reports patient currently is not appropriate for hospice home. Skeet Simmer confirmed she will continue to follow patient . CSW called [facility] and left voicemail. CSW awaiting callback. CSW spoke with patients daughter Para March and updated her on current status. Patients daughter confirmed that patient still has a SNF bed at [facility]. Patients daughter awaiting determination from [facility] and continues to discuss dc plan with palliative.CSW will continue to follow and assist with dc planning needs.  Expected Discharge Plan: Skilled Nursing Facility Barriers to Discharge: Continued Medical Work up  Expected Discharge Plan and Services Expected Discharge Plan: Skilled Nursing Facility In-house Referral: Clinical Social Work     Living arrangements for the past 2 months: Skilled Nursing Facility                                       Social Determinants of Health (SDOH) Interventions    Readmission Risk Interventions No flowsheet data found.

## 2021-07-14 NOTE — Care Management Important Message (Signed)
Important Message  Patient Details  Name: [name] [name] MRN: [id number] Date of Birth: Oct 04, 1930   Medicare Important Message Given:  Yes     Renie Ora 07/14/2021, 10:16 AM

## 2021-07-14 NOTE — Progress Notes (Signed)
ANTICOAGULATION CONSULT NOTE   Pharmacy Consult for Heparin Indication: DVT  Allergies  Allergen Reactions   Clonidine Derivatives Swelling    [name]   Fish Allergy Diarrhea, Swelling and Other (See Comments)    Turns skin "black," but can tolerate white fish Salmon- Diarrhea   Shellfish Allergy Hives   Doxycycline Rash and Other (See Comments)    Dizziness, also   Cetirizine Hcl Itching   Citrus Diarrhea and Other (See Comments)    Indigestion/heartburn, also; "Allergic to oranges" noted on MAR   Indomethacin Other (See Comments)    "Allergic," per MAR   Methyldopa Diarrhea   Morphine And Related Nausea And Vomiting and Other (See Comments)    "Family reports it drops her b/p that she needs iv fluids"   Morphine Sulfate Nausea And Vomiting and Other (See Comments)    "Family reports it drops her b/p that she needs iv fluids"   Pregabalin Nausea Only and Other (See Comments)    Hallucinations, also   Shellfish-Derived Products Itching and Other (See Comments)    Dizziness, also   Strawberry (Diagnostic) Itching   Strawberry Extract Itching and Other (See Comments)    Dizziness, also   Cetirizine Hcl Itching and Rash   Codeine Hives and Itching   Levaquin [Levofloxacin In D5w] Rash   Levofloxacin Rash   Tomato Rash    Patient Measurements: Height: 5\' 4"  (162.6 cm) Weight: 100.5 kg (221 lb 9 oz) IBW/kg (Calculated) : 54.7 Heparin dosing weight: 75.3 kg  Vital Signs: Temp: 98.4 F (36.9 C) (09/04 2058) Temp Source: Oral (09/04 2058) BP: 160/87 (09/04 2058) Pulse Rate: 74 (09/04 2058)  Labs: Recent Labs    07/12/21 0454 07/12/21 1229 07/12/21 2044 07/13/21 0212 07/13/21 1012 07/14/21 0119  HGB 9.5*  --   --  9.3*  --  8.9*  HCT 28.7*  --   --  27.6*  --  26.8*  PLT 243  --   --  233  --  242  HEPARINUNFRC 0.43   < > 0.30 0.36  --  0.18*  CREATININE 4.13*  --   --   --  3.44* 3.01*    < > = values in this interval not displayed.     Estimated Creatinine Clearance: 14.6 mL/min (A) (by C-G formula based on SCr of 3.01 mg/dL (H)).   Assessment: 87 yo female with chronic left femoral vein DVT of unknown age on lovenox and noted with AKI.  Last dose lovenox 8/31.  Transitioned to heparin 9/1.  Heparin level down to subtherapeutic (0.18) on gtt at 1500 units/hr. No bleeding reported per RN. Heparin is running in [facility] so it has been beeping overnight as being occluded so has to be restarted then also pt with diarrhea so having to be turned for clean up and that is causing line occlusion.  Goal of Therapy:  Heparin level 0.3-0.7 units/ml Monitor platelets by anticoagulation protocol: Yes   Plan:  Increase heparin to 1650 units/hr F/u 8 hr heparin level  [facility], PharmD, BCPS Please see amion for complete clinical pharmacist phone list 07/14/2021 2:09 AM

## 2021-07-14 NOTE — Progress Notes (Signed)
[facility] NEPHROLOGY PROGRESS NOTE  Assessment/ Plan:  # AKI/CKD stage IIIa - likely combination of contrast induced nephropathy as well as cardiorenal syndrome with diuresis.  UA negative for blood or protein.  No leukocytes.  Renal US without obstruction.  She is nonoliguric and creatinine level trending down.  Currently not on diuretics.  No renal replacement therapy needed.  Noted patient is DNR and plan for SNF hospice.  Seen by palliative care team.  # Acute on chronic systolic CHF - initially responded to IV lasix but now with AKI and decreased UOP since lasix stopped.  Volume status acceptable today, will  resume low dose oral torsemide starting from tomorrow.  # Metabolic acidosis: Continue sodium bicarbonate.  # Hypervolemic hyponatremia - due to CHF and AKI.  Resuming loop diuretics.  #Fever and leukocytosis - CT of abd and pelvis without abnormality.  Urine and blood cultures negative to date as well as respiratory panel.  Appears to have resolved.  # Acute metabolic encephalopathy: Seems like it has improved.  # HTN: Continue current antihypertensives.  Receiving torsemide.  # Rheumatoid arthritis - on chronic prednisone.  The plan to discharge to SNF with hospice noted.  Nothing further to add. I will sign off, please call back with question.  Subjective: Seen and examined.  Urine output recorded around 850 cc.  Denies nausea, vomiting however feels weak and tired. Objective Vital signs in last 24 hours: Vitals:   07/13/21 2058 07/14/21 0519 07/14/21 1004 07/14/21 1212  BP: (!) 160/87 (!) 158/65 (!) 166/94 (!) 131/112  Pulse: 74 70 72 70  Resp: 19 16 14 15   Temp: 98.4 F (36.9 C) 98.6 F (37 C)  98.1 F (36.7 C)  TempSrc: Oral Oral  Oral  SpO2: 100% 100% 100%   Weight:  101.5 kg    Height:       Weight change: 1 kg  Intake/Output Summary (Last 24 hours) at 07/14/2021 1218 Last data filed at 07/14/2021 0700 Gross per 24 hour  Intake 264.67 ml   Output 850 ml  Net -585.33 ml       Labs: Basic Metabolic Panel: Recent Labs  Lab 07/12/21 0454 07/13/21 1012 07/14/21 0119  NA 127* 130* 130*  K 5.2* 4.0 4.1  CL 97* 96* 101  CO2 16* 21* 19*  GLUCOSE 72 88 90  BUN 55* 51* 48*  CREATININE 4.13* 3.44* 3.01*  CALCIUM 8.0* 8.2* 8.4*  PHOS  --  5.1*  --    Liver Function Tests: Recent Labs  Lab 07/13/21 1012  ALBUMIN 2.7*   No results for input(s): LIPASE, AMYLASE in the last 168 hours. No results for input(s): AMMONIA in the last 168 hours. CBC: Recent Labs  Lab 07/08/21 1106 07/09/21 0205 07/10/21 0258 07/11/21 0602 07/12/21 0454 07/13/21 0212 07/14/21 0119  WBC 7.6 7.2 10.7* 10.2 8.9 7.3 7.4  NEUTROABS 5.1 6.6  --   --   --   --   --   HGB 9.5* 10.1* 9.4* 9.3* 9.5* 9.3* 8.9*  HCT 30.4* 31.8* 29.4* 28.8* 28.7* 27.6* 26.8*  MCV 96.5 95.8 95.5 94.1 93.2 92.0 91.5  PLT 203 243 227 224 243 233 242   Cardiac Enzymes: No results for input(s): CKTOTAL, CKMB, CKMBINDEX, TROPONINI in the last 168 hours. CBG: Recent Labs  Lab 07/13/21 1231 07/13/21 1601 07/13/21 2056 07/14/21 0738 07/14/21 1210  GLUCAP 97 107* 110* 99 104*    Iron Studies: No results for input(s): IRON, TIBC, TRANSFERRIN, FERRITIN in  the last 72 hours. Studies/Results: No results found.  Medications: Infusions:   Scheduled Medications:  acidophilus  1 capsule Oral Daily   apixaban  5 mg Oral BID   aspirin EC  81 mg Oral QHS   carvedilol  12.5 mg Oral BID WC   Gerhardt's butt cream   Topical QID   hydroxychloroquine  200 mg Oral BID   insulin aspart  0-6 Units Subcutaneous TID WC   leflunomide  20 mg Oral QPM   predniSONE  7.5 mg Oral q morning   senna-docusate  2 tablet Oral Q breakfast   And   senna-docusate  1 tablet Oral BID   sodium bicarbonate  1,300 mg Oral BID    have reviewed scheduled and prn medications.  Physical Exam: General:NAD, comfortable, alert and awake Heart:RRR, s1s2 nl Lungs:clear b/l, no  crackle Abdomen:soft, Non-tender, non-distended Extremities: Trace LE edema Neurology: Alert awake and following simple commands.  Kyron Schlitt Jaynie Collins 07/14/2021,12:18 PM  LOS: 9 days

## 2021-07-15 ENCOUNTER — Other Ambulatory Visit (HOSPITAL_COMMUNITY): Payer: Self-pay

## 2021-07-15 LAB — BASIC METABOLIC PANEL
Anion gap: 12 (ref 5–15)
BUN: 42 mg/dL — ABNORMAL HIGH (ref 8–23)
CO2: 20 mmol/L — ABNORMAL LOW (ref 22–32)
Calcium: 8.6 mg/dL — ABNORMAL LOW (ref 8.9–10.3)
Chloride: 100 mmol/L (ref 98–111)
Creatinine, Ser: 2.47 mg/dL — ABNORMAL HIGH (ref 0.44–1.00)
GFR, Estimated: 18 mL/min — ABNORMAL LOW (ref >60)
Glucose, Bld: 98 mg/dL (ref 70–99)
Potassium: 4 mmol/L (ref 3.5–5.1)
Sodium: 132 mmol/L — ABNORMAL LOW (ref 135–145)

## 2021-07-15 LAB — GLUCOSE, CAPILLARY
Glucose-Capillary: 113 mg/dL — ABNORMAL HIGH (ref 70–99)
Glucose-Capillary: 191 mg/dL — ABNORMAL HIGH (ref 70–99)
Glucose-Capillary: 138 mg/dL — ABNORMAL HIGH (ref 70–99)

## 2021-07-15 MED ORDER — APIXABAN 5 MG PO TABS: 5.0000 mg | ORAL_TABLET | Freq: Two times a day (BID) | ORAL | Status: AC

## 2021-07-15 MED ORDER — TORSEMIDE 20 MG PO TABS: 20.0000 mg | ORAL_TABLET | Freq: Every day | ORAL | Status: AC

## 2021-07-15 NOTE — Discharge Summary (Addendum)
Physician Discharge Summary  [name] [name] YIA:[id number] DOB: 04/14/1930 DOA: 07/04/2021  PCP: [name] [name], No  Admit date: 07/04/2021 Discharge date: 07/16/2021  Time spent:  Recommendations for Outpatient Follow-up:  Discharge to SNF with hospice services, transition to comfort care when she declines further   Discharge Diagnoses:  Principal Problem:   Acute on chronic systolic CHF (congestive heart failure) (HCC) AKI on CKD 3 Contrast nephropathy Adult failure to thrive General debility, deconditioning   CKD stage 3 due to type 2 diabetes mellitus (HCC)   Rheumatoid arthritis involving both knees (HCC)   History of CVA (cerebrovascular accident)   Acute CHF (congestive heart failure) (HCC)   Acute respiratory failure with hypoxia (HCC) DO NOT RESUSCITATE   Discharge Condition: Stable  Diet recommendation: Regular  Filed Weights   07/14/21 0519 07/15/21 0513 07/16/21 0500  Weight: 101.5 kg 100 kg 98 kg    History of present illness:  [name] [name] is an 87 y.o. female past medical history of chronic combined systolic and diastolic heart failure with an EF of 60%, rheumatoid arthritis, essential hypertension CAD PE currently not on anticoagulation comes into the ED for shortness of breath and acute encephalopathy.  CT angio of the chest was negative for PE   Hospital Course:   Acute kidney injury Contrast nephropathy -creatinine bumped from baseline of 1.3 upto peak of 4.4 -Suspected to be secondary to contrast nephropathy, got IV contrast with CTA in the ED on 8/26 midnight and 8/28 for CT abdomen -In addition cardiorenal syndrome, diuretics likely also contributed -She clinically worsened last week and became anuric, uremic, seen by nephrology in consultation, she was not felt to be a dialysis candidate, palliative care was consulted and decision was made for symptom and palliative treatment with plan for discharge back to SNF with hospice care -Creatinine and  urine output have slowly started improving -However remains significantly debilitated, with very poor oral intake -We think discharge back to SNF with hospice services is most appropriate with recommendations to transition to full comfort care when she declines further   Acute on chronic systolic CHF (congestive heart failure) (HCC): -CTA chest was negative for PE on admission, noted to have 2-3+ lower extremity edema initially, diuresed with IV Lasix earlier this admission, then transitioned to oral torsemide -Creatinine worsened significantly, diuretics held -Torsemide resumed at low-dose now   Age indeterminant DVT left leg -Dopplers on admission 8/27 noted chronic left femoral vein DVT -Treated with IV heparin in the setting of worsening AKI -Changed to Eliquis    Fever and leukocytosis: -Resolved, likely viral illness, COVID PCR was negative, CT chest, urine culture unremarkable, blood cultures are negative, respiratory virus panel negative  -Afebrile and leukocytosis has resolved now   Acute metabolic encephalopathy: -Improved   Hypertension: -Continue Coreg, amlodipine, resumed torsemide    Rheumatoid arthritis involving both knees (HCC): -Chronic prednisone continue 7.5 mg daily   Normocytic anemia: -Stable, monitor  History of CAD, Denies any chest pain.  History of diabetes mellitus type 2: A1c of 5.8 continue sliding scale insulin     DVT prophylaxis: Apixaban CODE STATUS: DNR    Discharge Exam: Vitals:   07/15/21 2053 07/16/21 0500  BP: 140/89 (!) 141/80  Pulse: 77 72  Resp: 19 16  Temp: 97.8 F (36.6 C) 97.8 F (36.6 C)  SpO2: 100% 100%    General: Awake alert, chronically ill-appearing, oriented to self and partly to place Cardiovascular: S1-S2, regular rate rhythm Respiratory: Few basilar rales  Discharge  Instructions    Allergies as of 07/16/2021       Reactions   Clonidine Derivatives Swelling   [name] reports patient's  tongue was swollen and patient hallucinated   Fish Allergy Diarrhea, Swelling, Other (See Comments)   Turns skin "black," but can tolerate white fish Salmon- Diarrhea   Shellfish Allergy Hives   Doxycycline Rash, Other (See Comments)   Dizziness, also   Cetirizine Hcl Itching   Citrus Diarrhea, Other (See Comments)   Indigestion/heartburn, also; "Allergic to oranges" noted on MAR   Indomethacin Other (See Comments)   "Allergic," per MAR   Methyldopa Diarrhea   Morphine And Related Nausea And Vomiting, Other (See Comments)   "Family reports it drops her b/p that she needs iv fluids"   Morphine Sulfate Nausea And Vomiting, Other (See Comments)   "Family reports it drops her b/p that she needs iv fluids"   Pregabalin Nausea Only, Other (See Comments)   Hallucinations, also   Shellfish-derived Products Itching, Other (See Comments)   Dizziness, also   Strawberry (diagnostic) Itching   Strawberry Extract Itching, Other (See Comments)   Dizziness, also   Cetirizine Hcl Itching, Rash   Codeine Hives, Itching   Levaquin [levofloxacin In D5w] Rash   Levofloxacin Rash   Tomato Rash        Medication List     STOP taking these medications    Acidophilus/Pectin Caps   aspirin EC 81 MG tablet   ferrous sulfate 325 (65 FE) MG tablet   potassium chloride SA 20 MEQ tablet Commonly known as: KLOR-CON       TAKE these medications    acetaminophen 500 MG tablet Commonly known as: TYLENOL Take 500 mg by mouth 4 (four) times daily.   Acidophilus Caps capsule Take 1 capsule by mouth every morning.   allopurinol 100 MG tablet Commonly known as: ZYLOPRIM Take 200 mg by mouth every morning.   amLODipine 5 MG tablet Commonly known as: NORVASC Take 1 tablet (5 mg total) by mouth daily. What changed: Another medication with the same name was removed. Continue taking this medication, and follow the directions you see here.   apixaban 5 MG Tabs tablet Commonly known as:  ELIQUIS Take 1 tablet (5 mg total) by mouth 2 (two) times daily.   Biofreeze 4 % Gel Generic drug: Menthol (Topical Analgesic) Apply 1 application topically 4 (four) times daily. Bilateral shoulders and knees   carvedilol 12.5 MG tablet Commonly known as: COREG Take 12.5 mg by mouth 2 (two) times daily with a meal.   D3-50 1.25 MG (50000 UT) capsule Generic drug: Cholecalciferol Take 50,000 Units by mouth every 30 (thirty) days. On or about the 19th of each month   fluticasone 50 MCG/ACT nasal spray Commonly known as: FLONASE Place 1 spray into both nostrils in the morning and at bedtime.   gabapentin 100 MG capsule Commonly known as: NEURONTIN Take 100-200 mg by mouth See admin instructions. Take one capsule (100 mg) by mouth twice daily - 8am and 4pm, take two capsules (200 mg) at bedtime   hydroxychloroquine 200 MG tablet Commonly known as: PLAQUENIL Take 1 tablet (200 mg total) by mouth 2 (two) times daily.   ipratropium-albuterol 0.5-2.5 (3) MG/3ML Soln Commonly known as: DUONEB Take 3 mLs by nebulization 4 (four) times daily.   leflunomide 20 MG tablet Commonly known as: ARAVA Take 20 mg by mouth every evening.   loratadine 10 MG tablet Commonly known as: CLARITIN Take 10 mg by  mouth 2 (two) times daily.   Olopatadine HCl 0.2 % Soln INSTILL 1 DROP IN BOTH EYES DAILY What changed: See the new instructions.   predniSONE 5 MG tablet Commonly known as: DELTASONE Take 7.5 mg by mouth every morning.   senna-docusate 8.6-50 MG tablet Commonly known as: Senokot-S Take 1-2 tablets by mouth at bedtime as needed for mild constipation. Take 1 tablet by mouth twice daily - 9am and 5pm, take 2 tablets at bedtime What changed:  when to take this reasons to take this   simethicone 80 MG chewable tablet Commonly known as: MYLICON Chew 80 mg by mouth 4 (four) times daily.   torsemide 20 MG tablet Commonly known as: DEMADEX Take 1 tablet (20 mg total) by mouth  daily. What changed: when to take this       Allergies  Allergen Reactions   Clonidine Derivatives Swelling    [name]   Fish Allergy Diarrhea, Swelling and Other (See Comments)    Turns skin "black," but can tolerate white fish Salmon- Diarrhea   Shellfish Allergy Hives   Doxycycline Rash and Other (See Comments)    Dizziness, also   Cetirizine Hcl Itching   Citrus Diarrhea and Other (See Comments)    Indigestion/heartburn, also; "Allergic to oranges" noted on MAR   Indomethacin Other (See Comments)    "Allergic," per MAR   Methyldopa Diarrhea   Morphine And Related Nausea And Vomiting and Other (See Comments)    "Family reports it drops her b/p that she needs iv fluids"   Morphine Sulfate Nausea And Vomiting and Other (See Comments)    "Family reports it drops her b/p that she needs iv fluids"   Pregabalin Nausea Only and Other (See Comments)    Hallucinations, also   Shellfish-Derived Products Itching and Other (See Comments)    Dizziness, also   Strawberry (Diagnostic) Itching   Strawberry Extract Itching and Other (See Comments)    Dizziness, also   Cetirizine Hcl Itching and Rash   Codeine Hives and Itching   Levaquin [Levofloxacin In D5w] Rash   Levofloxacin Rash   Tomato Rash      The results of significant diagnostics from this hospitalization (including imaging, microbiology, ancillary and laboratory) are listed below for reference.    Significant Diagnostic Studies: CT HEAD WO CONTRAST ( )  Result Date: 07/04/2021 CLINICAL DATA:  Altered mental status. EXAM: CT HEAD WITHOUT CONTRAST TECHNIQUE: Contiguous axial images were obtained from the base of the skull through the vertex without intravenous contrast. COMPARISON:  Mar 26, 2021 FINDINGS: Brain: There is mild cerebral atrophy with widening of the extra-axial spaces and ventricular dilatation. There are areas of decreased attenuation  within the white matter tracts of the supratentorial brain, consistent with microvascular disease changes. Small, chronic bilateral basal ganglia lacunar infarcts are seen. Vascular: No hyperdense vessel or unexpected calcification. Skull: Normal. Negative for fracture or focal lesion. Sinuses/Orbits: No acute finding. Other: None. IMPRESSION: 1. Generalized cerebral atrophy. 2. No acute intracranial abnormality. Electronically Signed   By: Aram Candela [name]   On: 07/04/2021 23:39   CT Angio Chest PE W/Cm &/Or Wo Cm  Result Date: 07/04/2021 CLINICAL DATA:  Concern for pulmonary embolism. EXAM: CT ANGIOGRAPHY CHEST WITH CONTRAST TECHNIQUE: Multidetector CT imaging of the chest was performed using the standard protocol during bolus administration of intravenous contrast. Multiplanar CT image reconstructions and MIPs were obtained to evaluate the vascular anatomy. CONTRAST:  5mL OMNIPAQUE IOHEXOL  350 MG/ML SOLN COMPARISON:  Chest CT dated 09/05/2018. FINDINGS: Cardiovascular: There is mild cardiomegaly. No pericardial effusion. Three-vessel coronary vascular calcification. Is mild atherosclerotic calcification of the thoracic aorta. No aneurysmal dilatation. Evaluation of the pulmonary arteries is limited due to respiratory motion artifact and suboptimal visualization of the peripheral branches. No central pulmonary artery embolus identified. Mediastinum/Nodes: No hilar or mediastinal adenopathy. The esophagus is grossly unremarkable. No mediastinal fluid collection. Lungs/Pleura: Probable trace left pleural effusion. Minimal left lung base atelectasis. No consolidative changes or pneumothorax. The central airways are patent. There is tracheomalacia. Upper Abdomen: Cholecystectomy. There is a 5 cm partially visualized right renal upper pole hypodense/cystic lesion. Musculoskeletal: Degenerative changes of the spine. No acute osseous pathology. Review of the MIP images confirms the above findings. IMPRESSION:  1. No CT evidence of central pulmonary artery embolus. 2. Aortic Atherosclerosis (ICD10-I70.0). Electronically Signed   By: Elgie Collard [name]   On: 07/04/2021 23:50   MR BRAIN WO CONTRAST  Result Date: 07/05/2021 CLINICAL DATA:  87 year old female with altered mental status.  TIA. EXAM: MRI HEAD WITHOUT CONTRAST TECHNIQUE: Multiplanar, multiecho pulse sequences of the brain and surrounding structures were obtained without intravenous contrast. COMPARISON:  Head CT 07/04/2021.  Brain MRI 03/26/2021. FINDINGS: Brain: No restricted diffusion to suggest acute infarction. No midline shift, mass effect, evidence of mass lesion, ventriculomegaly, extra-axial collection or acute intracranial hemorrhage. Cervicomedullary junction and pituitary are within normal limits. Patchy and confluent bilateral cerebral white matter T2 and FLAIR hyperintensity is stable since May. No cortical encephalomalacia or chronic cerebral blood products identified. Deep gray matter nuclei, brainstem and cerebellum remain within normal limits. Vascular: Major intracranial vascular flow voids are stable. Skull and upper cervical spine: Negative visible cervical spine. Visualized bone marrow signal is within normal limits. Sinuses/Orbits: Orbits appear stable and negative. Unchanged sphenoid sinus mucosal thickening. Other: Trace bilateral mastoid fluid is stable. Negative visible nasopharynx. A small round 12 mm T2 hyperintense nodule in the posterior superficial left parotid gland has been present since at least 2015 compatible with benign etiology. Visualized scalp soft tissues are within normal limits. IMPRESSION: 1. No acute intracranial abnormality. 2. Stable moderate cerebral white matter signal changes, most likely due to chronic small vessel disease. Electronically Signed   By: Odessa Fleming [name]   On: 07/05/2021 10:31   CT ABDOMEN PELVIS W CONTRAST  Result Date: 07/06/2021 CLINICAL DATA:  Abdominal pain, fever EXAM: CT ABDOMEN AND  PELVIS WITH CONTRAST TECHNIQUE: Multidetector CT imaging of the abdomen and pelvis was performed using the standard protocol following bolus administration of intravenous contrast. CONTRAST:  50mL OMNIPAQUE IOHEXOL 350 MG/ML SOLN COMPARISON:  July 24, 2018, July 04, 2021 FINDINGS: Lower chest: Bibasilar atelectasis. Likely focal air trapping of the peripheral RIGHT middle lobe. Adjacent to this is a pulmonary nodule peripherally which measures 6 mm (series 5, image 14). Cardiomegaly. Three-vessel coronary artery atherosclerotic calcifications. Hepatobiliary: Status post cholecystectomy. Dilation of the common bile duct to 12 mm, stable to decreased in comparison to prior and likely reflecting the sequela of cholecystectomy. No new focal hepatic lesion. Pancreas: Unremarkable. No pancreatic ductal dilatation or surrounding inflammatory changes. Spleen: Normal in size without focal abnormality. Adrenals/Urinary Tract: Adrenal glands are unremarkable. No hydronephrosis. RIGHT renal cyst. No obstructing nephrolithiasis. Excreted contrast within the bladder. Stomach/Bowel: No evidence of bowel obstruction. Diverticulosis. Patient is status post appendectomy. Small hiatal hernia. Vascular/Lymphatic: Severe atherosclerotic calcifications. No suspicious lymphadenopathy. Reproductive: Status post hysterectomy. No adnexal masses. Other: Small fat containing ventral hernia. Sequela of  injection in the LEFT pannus. Musculoskeletal: Femoral head sclerosis may reflect underlying avascular necrosis. Stable lucent lesion with well-defined sclerotic margins of the proximal LEFT femur likely reflecting a benign fibro-osseous lesion. Degenerative changes of the lumbar spine with unchanged mild wedging L1. Unchanged anterolisthesis of L4-5. Transitional anatomy with assimilation joints at S1-2 IMPRESSION: 1. No CT etiology for abdominal pain identified. 2. Nodular opacity in the RIGHT middle lobe measures 6 mm. Non-contrast  chest CT at 6-12 months is recommended. If the nodule is stable at time of repeat CT, then future CT at 18-24 months (from today's scan) is considered optional for low-risk patients, but is recommended for high-risk patients. This recommendation follows the consensus statement: Guidelines for Management of Incidental Pulmonary Nodules Detected on CT Images: From the Fleischner Society 2017; Radiology 2017; 284:228-243. Aortic Atherosclerosis (ICD10-I70.0). Electronically Signed   By: Meda Klinefelter [name]   On: 07/06/2021 16:49   US RENAL  Result Date: 07/09/2021 CLINICAL DATA:  Acute kidney injury. EXAM: RENAL / URINARY TRACT ULTRASOUND COMPLETE COMPARISON:  None. FINDINGS: Right Kidney: Renal measurements: 8.6 cm x 4.1 cm x 4.4 cm = volume: 90.99 mL. Echogenicity within normal limits. A 5.2 cm x 4.0 cm x 3.9 cm anechoic structure is seen within the upper pole of the right kidney. No abnormal flow is noted within this region on color Doppler evaluation. No hydronephrosis is visualized. Left Kidney: Renal measurements: 10.5 cm x 4.9 cm x 4.7 cm = volume: 125.11 mL. Echogenicity within normal limits. No mass or hydronephrosis visualized. Bladder: The urinary bladder is empty and subsequently limited in evaluation. Other: None. IMPRESSION: Large simple right renal cyst, likely benign. No additional follow-up is recommended. Electronically Signed   By: Aram Candela [name]   On: 07/09/2021 19:17   DG Chest Port 1 View  Result Date: 07/04/2021 CLINICAL DATA:  Shortness of breath EXAM: PORTABLE CHEST 1 VIEW COMPARISON:  03/26/2021 FINDINGS: Cardiomegaly. No focal opacity, pleural effusion or pneumothorax. Chronic deformity of left humeral head. IMPRESSION: No active disease.  Cardiomegaly Electronically Signed   By: Jasmine Pang [name]   On: 07/04/2021 19:54   VAS [place] LOWER EXTREMITY VENOUS (DVT)  Result Date: 07/05/2021  Lower Venous DVT Study Patient Name:  BLAKELEY SCHEIER  Date of Exam:   07/05/2021  Medical Rec #: [id number]         Accession #:    [id number] Date of Birth: 1930-02-25         Patient Gender: F Patient Age:   87 years Exam Location[facility]  [facility] Procedure:      VAS [place] LOWER EXTREMITY VENOUS (DVT) Referring Phys: Midge Minium --------------------------------------------------------------------------------  Indications: SOB, and Swelling.  Risk Factors: History of PE and DVT. Limitations: Body habitus, poor ultrasound/tissue interface and patient intolerance to compression. Comparison Study: 07-25-2018 Bilateral lower extremity venous study was positive                   for DVT involving the right peroneal veins. Performing Technologist: Jean Rosenthal RDMS, RVT  Examination Guidelines: A complete evaluation includes B-mode imaging, spectral Doppler, color Doppler, and power Doppler as needed of all accessible portions of each vessel. Bilateral testing is considered an integral part of a complete examination. Limited examinations for reoccurring indications may be performed as noted. The reflux portion of the exam is performed with the patient in reverse Trendelenburg.  +---------+---------------+---------+-----------+----------+--------------+ RIGHT    CompressibilityPhasicitySpontaneityPropertiesThrombus Aging +---------+---------------+---------+-----------+----------+--------------+ CFV      Full  Yes      Yes                                 +---------+---------------+---------+-----------+----------+--------------+ SFJ      Full                                                        +---------+---------------+---------+-----------+----------+--------------+ FV Prox  Full                                                        +---------+---------------+---------+-----------+----------+--------------+ FV Mid                  Yes      Yes                                  +---------+---------------+---------+-----------+----------+--------------+ FV Distal               Yes      Yes                                 +---------+---------------+---------+-----------+----------+--------------+ PFV      Full                                                        +---------+---------------+---------+-----------+----------+--------------+ POP      Full           Yes      Yes                                 +---------+---------------+---------+-----------+----------+--------------+ PTV      Full                                                        +---------+---------------+---------+-----------+----------+--------------+ PERO     Full                                                        +---------+---------------+---------+-----------+----------+--------------+   +---------+---------------+---------+-----------+----------+-------------------+ LEFT     CompressibilityPhasicitySpontaneityPropertiesThrombus Aging      +---------+---------------+---------+-----------+----------+-------------------+ CFV      Full           Yes      Yes                                      +---------+---------------+---------+-----------+----------+-------------------+  SFJ      Full                                                             +---------+---------------+---------+-----------+----------+-------------------+ FV Prox  Partial        Yes      Yes                  Age Indeterminate   +---------+---------------+---------+-----------+----------+-------------------+ FV Mid                  No       No                   Chronic             +---------+---------------+---------+-----------+----------+-------------------+ FV Distal               No       No                   Chronic             +---------+---------------+---------+-----------+----------+-------------------+ PFV      Full                                                              +---------+---------------+---------+-----------+----------+-------------------+ POP      Full           Yes      Yes                                      +---------+---------------+---------+-----------+----------+-------------------+ PTV      Full                                                             +---------+---------------+---------+-----------+----------+-------------------+ PERO                                                  Not well visualized +---------+---------------+---------+-----------+----------+-------------------+     Summary: RIGHT: - There is no evidence of deep vein thrombosis in the lower extremity. However, portions of this examination were limited- see technologist comments above.  - No cystic structure found in the popliteal fossa.  LEFT: - Findings consistent with age indeterminate deep vein thrombosis involving the proximal left femoral vein. - Findings consistent with chronic deep vein thrombosis involving the mid to distal left femoral vein.  *See table(s) above for measurements and observations. Electronically signed by Sherald Hess MD on 07/05/2021 at 1:28:45 PM.    Final     Microbiology: Recent Results (from the past 240 hour(s))  Respiratory (~20 pathogens) panel by PCR     Status: None   Collection  Time: 07/06/21 12:20 PM   Specimen: Nasopharyngeal Swab; Respiratory  Result Value Ref Range Status   Adenovirus NOT DETECTED NOT DETECTED Final   Coronavirus 229E NOT DETECTED NOT DETECTED Final    Comment: (NOTE) The Coronavirus on the Respiratory Panel, DOES NOT test for the novel  Coronavirus (2019 nCoV)    Coronavirus HKU1 NOT DETECTED NOT DETECTED Final   Coronavirus NL63 NOT DETECTED NOT DETECTED Final   Coronavirus OC43 NOT DETECTED NOT DETECTED Final   Metapneumovirus NOT DETECTED NOT DETECTED Final   Rhinovirus / Enterovirus NOT DETECTED NOT DETECTED Final   Influenza A NOT DETECTED NOT DETECTED Final    Influenza B NOT DETECTED NOT DETECTED Final   Parainfluenza Virus 1 NOT DETECTED NOT DETECTED Final   Parainfluenza Virus 2 NOT DETECTED NOT DETECTED Final   Parainfluenza Virus 3 NOT DETECTED NOT DETECTED Final   Parainfluenza Virus 4 NOT DETECTED NOT DETECTED Final   Respiratory Syncytial Virus NOT DETECTED NOT DETECTED Final   Bordetella pertussis NOT DETECTED NOT DETECTED Final   Bordetella Parapertussis NOT DETECTED NOT DETECTED Final   Chlamydophila pneumoniae NOT DETECTED NOT DETECTED Final   Mycoplasma pneumoniae NOT DETECTED NOT DETECTED Final    Comment: Performed at [facility], [address] [place], [place] [address]  SARS CORONAVIRUS 2 (TAT 6-24 HRS) Nasopharyngeal Nasopharyngeal Swab     Status: None   Collection Time: 07/15/21 11:40 PM   Specimen: Nasopharyngeal Swab  Result Value Ref Range Status   SARS Coronavirus 2 NEGATIVE NEGATIVE Final    Comment: (NOTE) SARS-CoV-2 target nucleic acids are NOT DETECTED.  The SARS-CoV-2 RNA is generally detectable in upper and lower respiratory specimens during the acute phase of infection. Negative results do not preclude SARS-CoV-2 infection, do not rule out co-infections with other pathogens, and should not be used as the sole basis for treatment or other patient management decisions. Negative results must be combined with clinical observations, patient history, and epidemiological information. The expected result is Negative.  Fact Sheet for Patients: HairSlick.no  Fact Sheet for Healthcare Providers: quierodirigir.com  This test is not yet approved or cleared by the [place] FDA and  has been authorized for detection and/or diagnosis of SARS-CoV-2 by FDA under an Emergency Use Authorization (EUA). This EUA will remain  in effect (meaning this test can be used) for the duration of the COVID-19 declaration under Se ction 564(b)(1) of the Act, 21  U.S.C. section 360bbb-3(b)(1), unless the authorization is terminated or revoked sooner.  Performed at [facility], [address] [place], [place] [address]      Labs: Basic Metabolic Panel: Recent Labs  Lab 07/11/21 0602 07/12/21 0454 07/13/21 1012 07/14/21 0119 07/15/21 0213  NA 126* 127* 130* 130* 132*  K 4.7 5.2* 4.0 4.1 4.0  CL 96* 97* 96* 101 100  CO2 17* 16* 21* 19* 20*  GLUCOSE 82 72 88 90 98  BUN 54* 55* 51* 48* 42*  CREATININE 4.30* 4.13* 3.44* 3.01* 2.47*  CALCIUM 8.0* 8.0* 8.2* 8.4* 8.6*  PHOS  --   --  5.1*  --   --    Liver Function Tests: Recent Labs  Lab 07/13/21 1012  ALBUMIN 2.7*   No results for input(s): LIPASE, AMYLASE in the last 168 hours. No results for input(s): AMMONIA in the last 168 hours. CBC: Recent Labs  Lab 07/10/21 0258 07/11/21 0602 07/12/21 0454 07/13/21 0212 07/14/21 0119  WBC 10.7* 10.2 8.9 7.3 7.4  HGB 9.4*  9.3* 9.5* 9.3* 8.9*  HCT 29.4* 28.8* 28.7* 27.6* 26.8*  MCV 95.5 94.1 93.2 92.0 91.5  PLT 227 224 243 233 242   Cardiac Enzymes: No results for input(s): CKTOTAL, CKMB, CKMBINDEX, TROPONINI in the last 168 hours. BNP: BNP (last 3 results) Recent Labs    03/26/21 1803 07/04/21 1950  BNP 76.9 2,067.1*    ProBNP (last 3 results) No results for input(s): PROBNP in the last 8760 hours.  CBG: Recent Labs  Lab 07/14/21 2133 07/15/21 0814 07/15/21 1140 07/15/21 1710 07/16/21 0817  GLUCAP 133* 113* 191* 138* 122*       Signed:  Zannie Cove MD.  [facility] 07/16/2021, 10:47 AM

## 2021-07-15 NOTE — Progress Notes (Signed)
Civil engineer, contracting [facility]) Hospital Liaison: RN note     Notified by Transition of Care Manger of patient/family request for [facility] services at [facility] SNF after discharge. Chart and patient information have been reviewed by [facility] physician. Hospice eligibility confirmed.  Writer visited with pt and pt's daughter, Para March, at bedside to initiate education related to hospice philosophy, services and team approach to care.  Para March verbalized understanding of information given. Per discussion, plan is for discharge [facility] possibly tomorrow via [facility].    Please send signed and completed DNR form home with patient/family.   Please provide prescriptions for mediations at discharge to ensure comfort until patient can be admitted onto hospice services.    [facility] Referral Center aware of the above. Please notify [facility] when patient is ready to leave the unit at discharge. (Call [phone] or [phone] after 5pm.)  [facility] information and contact numbers given to  [facility].       Please do not hesitate to call with questions.   Thank you for the opportunity to participate in this patient's care.  Gillian Scarce, BSN, RN       [facility] Liaison (listed on AMION under Hospice /[phone]  [phone] (24h on call)

## 2021-07-15 NOTE — Discharge Instructions (Signed)
Information on my medicine - ELIQUIS (apixaban)  Why was Eliquis prescribed for you? Eliquis was prescribed to treat blood clots that may have been found in the veins of your legs (deep vein thrombosis) or in your lungs (pulmonary embolism) and to reduce the risk of them occurring again.  What do You need to know about Eliquis ? The dose is one 5 mg tablet taken TWICE daily.  Eliquis may be taken with or without food.   Try to take the dose about the same time in the morning and in the evening. If you have difficulty swallowing the tablet whole please discuss with your pharmacist how to take the medication safely.  Take Eliquis exactly as prescribed and DO NOT stop taking Eliquis without talking to the doctor who prescribed the medication.  Stopping may increase your risk of developing a new blood clot.  Refill your prescription before you run out.  After discharge, you should have regular check-up appointments with your healthcare provider that is prescribing your Eliquis.    What do you do if you miss a dose? If a dose of ELIQUIS is not taken at the scheduled time, take it as soon as possible on the same day and twice-daily administration should be resumed. The dose should not be doubled to make up for a missed dose.  Important Safety Information A possible side effect of Eliquis is bleeding. You should call your healthcare provider right away if you experience any of the following: Bleeding from an injury or your nose that does not stop. Unusual colored urine (red or dark brown) or unusual colored stools (red or black). Unusual bruising for unknown reasons. A serious fall or if you hit your head (even if there is no bleeding).  Some medicines may interact with Eliquis and might increase your risk of bleeding or clotting while on Eliquis. To help avoid this, consult your healthcare provider or pharmacist prior to using any new prescription or non-prescription medications,  including herbals, vitamins, non-steroidal anti-inflammatory drugs (NSAIDs) and supplements.  This website has more information on Eliquis (apixaban): http://www.eliquis.com/eliquis/home  

## 2021-07-15 NOTE — TOC Progression Note (Signed)
Transition of Care [facility]) - Progression Note    Patient Details  Name: [name] [name] MRN: [id number] Date of Birth: 05-09-1930  Transition of Care [facility]) CM/SW Contact  Terrial Rhodes, LCSWA Phone Number: 07/15/2021, 11:28 AM  Clinical Narrative:     CSW received consult for hospice services to follow patient at SNF . CSW spoke with patients daughter Para March and Para March gave CSW permission to make referral to [facility] for hospice services to follow patient at [facility]. CSW made referral to [place] with [facility] for hospice services to follow patient at [facility] SN[facility]. CSW updated soy with [facility] with dc plan. CSW will continue to follow and assist with dc planning needs.  Expected Discharge Plan: Skilled Nursing Facility Barriers to Discharge: Continued Medical Work up  Expected Discharge Plan and Services Expected Discharge Plan: Skilled Nursing Facility In-house Referral: Clinical Social Work     Living arrangements for the past 2 months: Skilled Nursing Facility                                       Social Determinants of Health (SDOH) Interventions    Readmission Risk Interventions No flowsheet data found.

## 2021-07-15 NOTE — TOC Benefit Eligibility Note (Signed)
Patient Product/process development scientist completed.    The patient is currently admitted and upon discharge could be taking Eliquis 5 mg.  The current 30 day co-pay is, $0.00.   The patient is insured through [employer] Dual Complete Medicare Part D/[place] Medicaid     [name], CPhT Pharmacy Patient Advocate Specialist [facility] Antimicrobial Stewardship Team Direct Number: [phone]  Fax: [phone]

## 2021-07-16 LAB — GLUCOSE, CAPILLARY
Glucose-Capillary: 145 mg/dL — ABNORMAL HIGH (ref 70–99)
Glucose-Capillary: 122 mg/dL — ABNORMAL HIGH (ref 70–99)
Glucose-Capillary: 135 mg/dL — ABNORMAL HIGH (ref 70–99)

## 2021-07-16 LAB — SARS CORONAVIRUS 2 (TAT 6-24 HRS): SARS Coronavirus 2: NEGATIVE

## 2021-07-16 MED ORDER — LOPERAMIDE HCL 2 MG PO CAPS
2.0000 mg | ORAL_CAPSULE | Freq: Once | ORAL | Status: AC
  Administered 2021-07-16: 2 mg via ORAL
  Filled 2021-07-16: qty 1

## 2021-07-16 MED ORDER — SENNOSIDES-DOCUSATE SODIUM 8.6-50 MG PO TABS: 1.0000 | ORAL_TABLET | Freq: Every evening | ORAL | Status: AC | PRN

## 2021-07-16 NOTE — TOC Transition Note (Signed)
Transition of Care [facility]) - CM/SW Discharge Note   Patient Details  Name: [name] [name] MRN: [id number] Date of Birth: 07-12-1930  Transition of Care [facility]) CM/SW Contact:  Terrial Rhodes, LCSWA Phone Number: 07/16/2021, 11:36 AM   Clinical Narrative:     [facility] will [facility] to: [facility] with Hospice services to follow  Anticipated DC date: 07/16/2021  Family notified: Para March   Transport by: Sharin Mons  ?  Per MD patient ready for DC to [facility] with hospice services to follow. RN, patient, patient's family, Macon Large with [facility], and facility notified of DC. Discharge Summary sent to facility. RN given number for report tele# [phone] RM# 205A.Marland Kitchen DC packet on chart. DNR signed by MD attached to patients DC packet. Ambulance transport requested for patient.  CSW signing off.   Final next level of care: Skilled Nursing Facility Barriers to Discharge: No Barriers Identified   Patient Goals and CMS Choice Patient states their goals for this hospitalization and ongoing recovery are:: SNF CMS Medicare.gov Compare Post Acute Care list provided to:: Patient Represenative (must comment) Para March patients daughter) Choice offered to / list presented to : Adult Children Para March patients daughter)  Discharge Placement              [facility] Patient to be transferred to facility by: [facility] Name of family member notified: Para March Patient and family notified of of transfer: 07/16/21  Discharge Plan and Services In-house Referral: Clinical Social Work                                   Social Determinants of Health (SDOH) Interventions     Readmission Risk Interventions No flowsheet data found.

## 2021-07-16 NOTE — Progress Notes (Signed)
Report called to [facility] LPN at [facility].

## 2021-08-07 ENCOUNTER — Ambulatory Visit (HOSPITAL_BASED_OUTPATIENT_CLINIC_OR_DEPARTMENT_OTHER): Payer: Medicare Other | Admitting: Cardiovascular Disease

## 2021-08-09 DEATH — deceased
